# Patient Record
Sex: Female | Born: 1955 | Race: Black or African American | Hispanic: No | Marital: Single | Smoking: Former smoker
Health system: Southern US, Community
[De-identification: ages and names within clinical notes are randomized; demographics above are authoritative.]

## PROBLEM LIST (undated history)

## (undated) ENCOUNTER — Emergency Department (HOSPITAL_COMMUNITY): Admission: EM | Payer: PPO | Source: Home / Self Care

## (undated) DIAGNOSIS — H409 Unspecified glaucoma: Secondary | ICD-10-CM

## (undated) DIAGNOSIS — D649 Anemia, unspecified: Secondary | ICD-10-CM

## (undated) DIAGNOSIS — I779 Disorder of arteries and arterioles, unspecified: Secondary | ICD-10-CM

## (undated) DIAGNOSIS — F419 Anxiety disorder, unspecified: Secondary | ICD-10-CM

## (undated) DIAGNOSIS — Z9889 Other specified postprocedural states: Secondary | ICD-10-CM

## (undated) DIAGNOSIS — M1990 Unspecified osteoarthritis, unspecified site: Secondary | ICD-10-CM

## (undated) DIAGNOSIS — E538 Deficiency of other specified B group vitamins: Secondary | ICD-10-CM

## (undated) DIAGNOSIS — I480 Paroxysmal atrial fibrillation: Secondary | ICD-10-CM

## (undated) DIAGNOSIS — J189 Pneumonia, unspecified organism: Secondary | ICD-10-CM

## (undated) DIAGNOSIS — Z87442 Personal history of urinary calculi: Secondary | ICD-10-CM

## (undated) DIAGNOSIS — I700 Atherosclerosis of aorta: Secondary | ICD-10-CM

## (undated) DIAGNOSIS — N201 Calculus of ureter: Secondary | ICD-10-CM

## (undated) DIAGNOSIS — D509 Iron deficiency anemia, unspecified: Secondary | ICD-10-CM

## (undated) DIAGNOSIS — K8020 Calculus of gallbladder without cholecystitis without obstruction: Secondary | ICD-10-CM

## (undated) DIAGNOSIS — E119 Type 2 diabetes mellitus without complications: Secondary | ICD-10-CM

## (undated) DIAGNOSIS — K219 Gastro-esophageal reflux disease without esophagitis: Secondary | ICD-10-CM

## (undated) DIAGNOSIS — D689 Coagulation defect, unspecified: Secondary | ICD-10-CM

## (undated) DIAGNOSIS — T7840XA Allergy, unspecified, initial encounter: Secondary | ICD-10-CM

## (undated) DIAGNOSIS — Z973 Presence of spectacles and contact lenses: Secondary | ICD-10-CM

## (undated) DIAGNOSIS — I739 Peripheral vascular disease, unspecified: Secondary | ICD-10-CM

## (undated) DIAGNOSIS — H269 Unspecified cataract: Secondary | ICD-10-CM

## (undated) DIAGNOSIS — I10 Essential (primary) hypertension: Secondary | ICD-10-CM

## (undated) DIAGNOSIS — E785 Hyperlipidemia, unspecified: Secondary | ICD-10-CM

## (undated) DIAGNOSIS — R112 Nausea with vomiting, unspecified: Secondary | ICD-10-CM

## (undated) DIAGNOSIS — E039 Hypothyroidism, unspecified: Secondary | ICD-10-CM

## (undated) HISTORY — PX: POLYPECTOMY: SHX149

## (undated) HISTORY — PX: AUGMENTATION MAMMAPLASTY: SUR837

## (undated) HISTORY — DX: Essential (primary) hypertension: I10

## (undated) HISTORY — DX: Allergy, unspecified, initial encounter: T78.40XA

## (undated) HISTORY — DX: Hypothyroidism, unspecified: E03.9

## (undated) HISTORY — DX: Anemia, unspecified: D64.9

## (undated) HISTORY — DX: Unspecified cataract: H26.9

## (undated) HISTORY — PX: COLONOSCOPY: SHX174

## (undated) HISTORY — DX: Hyperlipidemia, unspecified: E78.5

## (undated) HISTORY — PX: BELPHAROPTOSIS REPAIR: SHX369

## (undated) HISTORY — PX: EYE SURGERY: SHX253

## (undated) HISTORY — DX: Coagulation defect, unspecified: D68.9

## (undated) HISTORY — DX: Unspecified glaucoma: H40.9

## (undated) HISTORY — PX: LITHOTRIPSY: SUR834

## (undated) HISTORY — PX: EXTRACORPOREAL SHOCK WAVE LITHOTRIPSY: SHX1557

## (undated) SURGERY — ESOPHAGOGASTRODUODENOSCOPY (EGD) WITH PROPOFOL
Anesthesia: Monitor Anesthesia Care

---
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
## 1998-05-28 ENCOUNTER — Emergency Department (HOSPITAL_COMMUNITY): Admission: EM | Admit: 1998-05-28 | Discharge: 1998-05-28 | Payer: Self-pay | Admitting: Emergency Medicine

## 1998-05-29 ENCOUNTER — Emergency Department (HOSPITAL_COMMUNITY): Admission: EM | Admit: 1998-05-29 | Discharge: 1998-05-29 | Payer: Self-pay | Admitting: Emergency Medicine

## 1999-06-06 ENCOUNTER — Other Ambulatory Visit: Admission: RE | Admit: 1999-06-06 | Discharge: 1999-06-06 | Payer: Self-pay | Admitting: General Practice

## 2002-04-14 ENCOUNTER — Inpatient Hospital Stay (HOSPITAL_COMMUNITY): Admission: EM | Admit: 2002-04-14 | Discharge: 2002-04-17 | Payer: Self-pay

## 2002-04-14 ENCOUNTER — Encounter (INDEPENDENT_AMBULATORY_CARE_PROVIDER_SITE_OTHER): Payer: Self-pay | Admitting: *Deleted

## 2002-04-14 HISTORY — PX: OTHER SURGICAL HISTORY: SHX169

## 2002-04-21 ENCOUNTER — Ambulatory Visit (HOSPITAL_BASED_OUTPATIENT_CLINIC_OR_DEPARTMENT_OTHER): Admission: RE | Admit: 2002-04-21 | Discharge: 2002-04-21 | Payer: Self-pay | Admitting: Orthopedic Surgery

## 2002-04-21 HISTORY — PX: OTHER SURGICAL HISTORY: SHX169

## 2002-06-02 ENCOUNTER — Encounter: Admission: RE | Admit: 2002-06-02 | Discharge: 2002-07-21 | Payer: Self-pay | Admitting: Specialist

## 2003-02-10 ENCOUNTER — Other Ambulatory Visit: Admission: RE | Admit: 2003-02-10 | Discharge: 2003-02-10 | Payer: Self-pay | Admitting: Gynecology

## 2003-03-23 ENCOUNTER — Encounter: Admission: RE | Admit: 2003-03-23 | Discharge: 2003-03-23 | Payer: Self-pay | Admitting: Unknown Physician Specialty

## 2004-10-25 ENCOUNTER — Other Ambulatory Visit: Admission: RE | Admit: 2004-10-25 | Discharge: 2004-10-25 | Payer: Self-pay | Admitting: Gynecology

## 2004-12-06 ENCOUNTER — Encounter: Admission: RE | Admit: 2004-12-06 | Discharge: 2005-03-06 | Payer: Self-pay | Admitting: Surgery

## 2004-12-06 ENCOUNTER — Ambulatory Visit (HOSPITAL_COMMUNITY): Admission: RE | Admit: 2004-12-06 | Discharge: 2004-12-06 | Payer: Self-pay | Admitting: Surgery

## 2004-12-14 ENCOUNTER — Ambulatory Visit (HOSPITAL_COMMUNITY): Admission: RE | Admit: 2004-12-14 | Discharge: 2004-12-14 | Payer: Self-pay | Admitting: Surgery

## 2004-12-21 ENCOUNTER — Encounter: Admission: RE | Admit: 2004-12-21 | Discharge: 2004-12-21 | Payer: Self-pay | Admitting: Unknown Physician Specialty

## 2005-05-16 ENCOUNTER — Encounter: Admission: RE | Admit: 2005-05-16 | Discharge: 2005-08-14 | Payer: Self-pay | Admitting: Surgery

## 2005-05-29 ENCOUNTER — Ambulatory Visit (HOSPITAL_COMMUNITY): Admission: RE | Admit: 2005-05-29 | Discharge: 2005-05-30 | Payer: Self-pay | Admitting: Surgery

## 2005-05-29 HISTORY — PX: LAPAROSCOPIC GASTRIC BANDING: SHX1100

## 2005-09-07 ENCOUNTER — Encounter: Admission: RE | Admit: 2005-09-07 | Discharge: 2005-09-07 | Payer: Self-pay | Admitting: Surgery

## 2005-11-30 ENCOUNTER — Other Ambulatory Visit: Admission: RE | Admit: 2005-11-30 | Discharge: 2005-11-30 | Payer: Self-pay | Admitting: Vascular Surgery

## 2005-12-22 ENCOUNTER — Encounter: Admission: RE | Admit: 2005-12-22 | Discharge: 2005-12-22 | Payer: Self-pay | Admitting: Unknown Physician Specialty

## 2005-12-25 ENCOUNTER — Encounter: Admission: RE | Admit: 2005-12-25 | Discharge: 2005-12-25 | Payer: Self-pay | Admitting: Surgery

## 2006-08-15 ENCOUNTER — Other Ambulatory Visit: Admission: RE | Admit: 2006-08-15 | Discharge: 2006-08-15 | Payer: Self-pay | Admitting: Gynecology

## 2006-08-27 ENCOUNTER — Encounter: Admission: RE | Admit: 2006-08-27 | Discharge: 2006-11-25 | Payer: Self-pay | Admitting: Surgery

## 2007-03-04 ENCOUNTER — Encounter: Admission: RE | Admit: 2007-03-04 | Discharge: 2007-03-04 | Payer: Self-pay | Admitting: Gynecology

## 2007-05-02 HISTORY — PX: COMBINED AUGMENTATION MAMMAPLASTY AND ABDOMINOPLASTY: SUR291

## 2008-02-11 LAB — HM PAP SMEAR: HM Pap smear: NORMAL

## 2008-04-13 ENCOUNTER — Encounter: Admission: RE | Admit: 2008-04-13 | Discharge: 2008-04-13 | Payer: Self-pay | Admitting: Gynecology

## 2008-05-12 ENCOUNTER — Other Ambulatory Visit: Admission: RE | Admit: 2008-05-12 | Discharge: 2008-05-12 | Payer: Self-pay | Admitting: Gynecology

## 2008-05-13 LAB — CONVERTED CEMR LAB: Pap Smear: NORMAL

## 2008-08-05 ENCOUNTER — Telehealth: Payer: Self-pay | Admitting: Internal Medicine

## 2008-08-05 ENCOUNTER — Ambulatory Visit: Payer: Self-pay | Admitting: Internal Medicine

## 2008-08-05 DIAGNOSIS — E1165 Type 2 diabetes mellitus with hyperglycemia: Secondary | ICD-10-CM

## 2008-08-05 DIAGNOSIS — D649 Anemia, unspecified: Secondary | ICD-10-CM | POA: Insufficient documentation

## 2008-08-05 DIAGNOSIS — IMO0002 Reserved for concepts with insufficient information to code with codable children: Secondary | ICD-10-CM | POA: Insufficient documentation

## 2008-08-05 DIAGNOSIS — E785 Hyperlipidemia, unspecified: Secondary | ICD-10-CM | POA: Insufficient documentation

## 2008-08-05 DIAGNOSIS — I10 Essential (primary) hypertension: Secondary | ICD-10-CM

## 2008-08-05 DIAGNOSIS — E1151 Type 2 diabetes mellitus with diabetic peripheral angiopathy without gangrene: Secondary | ICD-10-CM

## 2008-08-05 LAB — CONVERTED CEMR LAB
ALT: 15 units/L (ref 0–35)
AST: 14 units/L (ref 0–37)
Albumin: 4 g/dL (ref 3.5–5.2)
Alkaline Phosphatase: 64 units/L (ref 39–117)
BUN: 15 mg/dL (ref 6–23)
Basophils Absolute: 0 10*3/uL (ref 0.0–0.1)
Basophils Relative: 0.1 % (ref 0.0–3.0)
Bilirubin Urine: NEGATIVE
Bilirubin, Direct: 0.1 mg/dL (ref 0.0–0.3)
CO2: 31 meq/L (ref 19–32)
Calcium: 9.9 mg/dL (ref 8.4–10.5)
Chloride: 108 meq/L (ref 96–112)
Cholesterol, target level: 200 mg/dL
Cholesterol: 194 mg/dL (ref 0–200)
Creatinine, Ser: 0.7 mg/dL (ref 0.4–1.2)
Creatinine,U: 77.5 mg/dL
Eosinophils Absolute: 0.1 10*3/uL (ref 0.0–0.7)
Eosinophils Relative: 1.3 % (ref 0.0–5.0)
Folate: 18.4 ng/mL
GFR calc non Af Amer: 93.6 mL/min (ref >60)
Glucose, Bld: 79 mg/dL (ref 70–99)
HCT: 33.1 % — ABNORMAL LOW (ref 36.0–46.0)
HDL goal, serum: 40 mg/dL
HDL: 47.7 mg/dL (ref >39.00)
Hemoglobin: 10.9 g/dL — ABNORMAL LOW (ref 12.0–15.0)
Hgb A1c MFr Bld: 6.6 % — ABNORMAL HIGH (ref 4.6–6.5)
Iron: 70 ug/dL (ref 42–145)
Ketones, ur: NEGATIVE mg/dL
LDL Cholesterol: 118 mg/dL — ABNORMAL HIGH (ref 0–99)
LDL Goal: 100 mg/dL
Lymphocytes Relative: 37.4 % (ref 12.0–46.0)
Lymphs Abs: 3 10*3/uL (ref 0.7–4.0)
MCHC: 33 g/dL (ref 30.0–36.0)
MCV: 86.2 fL (ref 78.0–100.0)
Microalb Creat Ratio: 74.8 mg/g — ABNORMAL HIGH (ref 0.0–30.0)
Microalb, Ur: 5.8 mg/dL — ABNORMAL HIGH (ref 0.0–1.9)
Monocytes Absolute: 0.5 10*3/uL (ref 0.1–1.0)
Monocytes Relative: 5.9 % (ref 3.0–12.0)
Neutro Abs: 4.3 10*3/uL (ref 1.4–7.7)
Neutrophils Relative %: 55.3 % (ref 43.0–77.0)
Nitrite: POSITIVE
Platelets: 264 10*3/uL (ref 150.0–400.0)
Potassium: 3.9 meq/L (ref 3.5–5.1)
RBC: 3.84 M/uL — ABNORMAL LOW (ref 3.87–5.11)
RDW: 14.4 % (ref 11.5–14.6)
Saturation Ratios: 23 % (ref 20.0–50.0)
Sodium: 145 meq/L (ref 135–145)
Specific Gravity, Urine: 1.025 (ref 1.000–1.030)
TSH: 0.06 microintl units/mL — ABNORMAL LOW (ref 0.35–5.50)
Total Bilirubin: 0.8 mg/dL (ref 0.3–1.2)
Total CHOL/HDL Ratio: 4
Total Protein: 7 g/dL (ref 6.0–8.3)
Transferrin: 217.1 mg/dL (ref 212.0–360.0)
Triglycerides: 143 mg/dL (ref 0.0–149.0)
Urine Glucose: NEGATIVE mg/dL
Urobilinogen, UA: 0.2 (ref 0.0–1.0)
VLDL: 28.6 mg/dL (ref 0.0–40.0)
Vitamin B-12: 372 pg/mL (ref 211–911)
WBC: 7.9 10*3/uL (ref 4.5–10.5)
pH: 6 (ref 5.0–8.0)

## 2008-10-05 ENCOUNTER — Ambulatory Visit: Payer: Self-pay | Admitting: Internal Medicine

## 2008-10-05 DIAGNOSIS — N390 Urinary tract infection, site not specified: Secondary | ICD-10-CM

## 2008-10-06 ENCOUNTER — Telehealth (INDEPENDENT_AMBULATORY_CARE_PROVIDER_SITE_OTHER): Payer: Self-pay | Admitting: *Deleted

## 2008-10-07 ENCOUNTER — Encounter: Payer: Self-pay | Admitting: Internal Medicine

## 2008-11-03 ENCOUNTER — Telehealth: Payer: Self-pay | Admitting: Internal Medicine

## 2008-11-04 ENCOUNTER — Ambulatory Visit: Payer: Self-pay | Admitting: Endocrinology

## 2008-11-04 DIAGNOSIS — E042 Nontoxic multinodular goiter: Secondary | ICD-10-CM | POA: Insufficient documentation

## 2008-11-04 DIAGNOSIS — E0590 Thyrotoxicosis, unspecified without thyrotoxic crisis or storm: Secondary | ICD-10-CM

## 2008-11-20 ENCOUNTER — Encounter: Payer: Self-pay | Admitting: Internal Medicine

## 2008-12-17 ENCOUNTER — Encounter (HOSPITAL_COMMUNITY): Admission: RE | Admit: 2008-12-17 | Discharge: 2009-01-28 | Payer: Self-pay | Admitting: Endocrinology

## 2009-01-27 ENCOUNTER — Encounter (INDEPENDENT_AMBULATORY_CARE_PROVIDER_SITE_OTHER): Payer: Self-pay | Admitting: *Deleted

## 2009-02-01 ENCOUNTER — Ambulatory Visit: Payer: Self-pay | Admitting: Internal Medicine

## 2009-02-01 ENCOUNTER — Telehealth: Payer: Self-pay | Admitting: Internal Medicine

## 2009-02-01 LAB — CONVERTED CEMR LAB
ALT: 11 units/L (ref 0–35)
AST: 14 units/L (ref 0–37)
Albumin: 4.2 g/dL (ref 3.5–5.2)
Alkaline Phosphatase: 74 units/L (ref 39–117)
BUN: 14 mg/dL (ref 6–23)
Basophils Absolute: 0.1 10*3/uL (ref 0.0–0.1)
Basophils Relative: 0.9 % (ref 0.0–3.0)
Bilirubin Urine: NEGATIVE
Bilirubin, Direct: 0 mg/dL (ref 0.0–0.3)
CO2: 31 meq/L (ref 19–32)
Calcium: 10 mg/dL (ref 8.4–10.5)
Chloride: 108 meq/L (ref 96–112)
Cholesterol: 238 mg/dL — ABNORMAL HIGH (ref 0–200)
Creatinine, Ser: 0.7 mg/dL (ref 0.4–1.2)
Creatinine,U: 129.9 mg/dL
Direct LDL: 165.6 mg/dL
Eosinophils Absolute: 0.1 10*3/uL (ref 0.0–0.7)
Eosinophils Relative: 1 % (ref 0.0–5.0)
GFR calc non Af Amer: 93.42 mL/min (ref >60)
Glucose, Bld: 88 mg/dL (ref 70–99)
HCT: 36.7 % (ref 36.0–46.0)
HDL: 51.2 mg/dL (ref >39.00)
Hemoglobin: 12.7 g/dL (ref 12.0–15.0)
Hep A Total Ab: POSITIVE — AB
Hep B Core Total Ab: NEGATIVE
Hep B S Ab: NEGATIVE
Hgb A1c MFr Bld: 6.4 % (ref 4.6–6.5)
Ketones, ur: NEGATIVE mg/dL
Lymphocytes Relative: 29.6 % (ref 12.0–46.0)
Lymphs Abs: 2.7 10*3/uL (ref 0.7–4.0)
MCHC: 34.6 g/dL (ref 30.0–36.0)
MCV: 85.8 fL (ref 78.0–100.0)
Microalb Creat Ratio: 28.5 mg/g (ref 0.0–30.0)
Microalb, Ur: 3.7 mg/dL — ABNORMAL HIGH (ref 0.0–1.9)
Monocytes Absolute: 0.4 10*3/uL (ref 0.1–1.0)
Monocytes Relative: 4.8 % (ref 3.0–12.0)
Neutro Abs: 5.7 10*3/uL (ref 1.4–7.7)
Neutrophils Relative %: 63.7 % (ref 43.0–77.0)
Nitrite: POSITIVE
Platelets: 290 10*3/uL (ref 150.0–400.0)
Potassium: 4.3 meq/L (ref 3.5–5.1)
RBC: 4.28 M/uL (ref 3.87–5.11)
RDW: 12.2 % (ref 11.5–14.6)
Rubella: >500 intl units/mL — ABNORMAL HIGH
Rubeola IgG: >7.4 — ABNORMAL HIGH
Sodium: 143 meq/L (ref 135–145)
Specific Gravity, Urine: 1.025 (ref 1.000–1.030)
Total Bilirubin: 0.7 mg/dL (ref 0.3–1.2)
Total CHOL/HDL Ratio: 5
Total Protein, Urine: NEGATIVE mg/dL
Total Protein: 7.4 g/dL (ref 6.0–8.3)
Triglycerides: 173 mg/dL — ABNORMAL HIGH (ref 0.0–149.0)
Urine Glucose: NEGATIVE mg/dL
Urobilinogen, UA: 0.2 (ref 0.0–1.0)
VLDL: 34.6 mg/dL (ref 0.0–40.0)
WBC: 9 10*3/uL (ref 4.5–10.5)
pH: 6 (ref 5.0–8.0)

## 2009-02-02 ENCOUNTER — Telehealth: Payer: Self-pay | Admitting: Internal Medicine

## 2009-05-24 ENCOUNTER — Telehealth: Payer: Self-pay | Admitting: Internal Medicine

## 2009-09-29 LAB — HM MAMMOGRAPHY: HM Mammogram: NORMAL

## 2010-02-22 LAB — HM DIABETES EYE EXAM

## 2010-03-01 ENCOUNTER — Ambulatory Visit: Payer: Self-pay | Admitting: Internal Medicine

## 2010-03-01 LAB — CONVERTED CEMR LAB
ALT: 11 units/L (ref 0–35)
AST: 14 units/L (ref 0–37)
Albumin: 3.8 g/dL (ref 3.5–5.2)
Alkaline Phosphatase: 56 units/L (ref 39–117)
BUN: 17 mg/dL (ref 6–23)
Basophils Absolute: 0 10*3/uL (ref 0.0–0.1)
Basophils Relative: 0.4 % (ref 0.0–3.0)
Bilirubin Urine: NEGATIVE
Bilirubin, Direct: 0 mg/dL (ref 0.0–0.3)
CO2: 29 meq/L (ref 19–32)
Calcium: 9.7 mg/dL (ref 8.4–10.5)
Chloride: 107 meq/L (ref 96–112)
Cholesterol: 235 mg/dL — ABNORMAL HIGH (ref 0–200)
Creatinine, Ser: 0.7 mg/dL (ref 0.4–1.2)
Creatinine,U: 89.4 mg/dL
Direct LDL: 160.2 mg/dL
Eosinophils Absolute: 0.1 10*3/uL (ref 0.0–0.7)
Eosinophils Relative: 1.7 % (ref 0.0–5.0)
Folate: 8.4 ng/mL
GFR calc non Af Amer: 94.58 mL/min (ref >60)
Glucose, Bld: 60 mg/dL — ABNORMAL LOW (ref 70–99)
HCT: 32.6 % — ABNORMAL LOW (ref 36.0–46.0)
HDL: 49.3 mg/dL (ref >39.00)
Hemoglobin: 11.1 g/dL — ABNORMAL LOW (ref 12.0–15.0)
Hgb A1c MFr Bld: 7 % — ABNORMAL HIGH (ref 4.6–6.5)
Iron: 35 ug/dL — ABNORMAL LOW (ref 42–145)
Ketones, ur: NEGATIVE mg/dL
Lymphocytes Relative: 40 % (ref 12.0–46.0)
Lymphs Abs: 3.3 10*3/uL (ref 0.7–4.0)
MCHC: 34.1 g/dL (ref 30.0–36.0)
MCV: 83.4 fL (ref 78.0–100.0)
Microalb Creat Ratio: 3.9 mg/g (ref 0.0–30.0)
Microalb, Ur: 3.5 mg/dL — ABNORMAL HIGH (ref 0.0–1.9)
Monocytes Absolute: 0.5 10*3/uL (ref 0.1–1.0)
Monocytes Relative: 6.1 % (ref 3.0–12.0)
Neutro Abs: 4.2 10*3/uL (ref 1.4–7.7)
Neutrophils Relative %: 51.8 % (ref 43.0–77.0)
Nitrite: NEGATIVE
Platelets: 285 10*3/uL (ref 150.0–400.0)
Potassium: 4 meq/L (ref 3.5–5.1)
RBC: 3.91 M/uL (ref 3.87–5.11)
RDW: 14.4 % (ref 11.5–14.6)
Saturation Ratios: 9.9 % — ABNORMAL LOW (ref 20.0–50.0)
Sodium: 142 meq/L (ref 135–145)
Specific Gravity, Urine: 1.025 (ref 1.000–1.030)
TSH: 0.11 microintl units/mL — ABNORMAL LOW (ref 0.35–5.50)
Total Bilirubin: 0.6 mg/dL (ref 0.3–1.2)
Total CHOL/HDL Ratio: 5
Total Protein, Urine: NEGATIVE mg/dL
Total Protein: 6.7 g/dL (ref 6.0–8.3)
Transferrin: 253.1 mg/dL (ref 212.0–360.0)
Triglycerides: 128 mg/dL (ref 0.0–149.0)
Urine Glucose: NEGATIVE mg/dL
Urobilinogen, UA: 0.2 (ref 0.0–1.0)
VLDL: 25.6 mg/dL (ref 0.0–40.0)
Vitamin B-12: 322 pg/mL (ref 211–911)
WBC: 8.2 10*3/uL (ref 4.5–10.5)
pH: 6 (ref 5.0–8.0)

## 2010-03-02 ENCOUNTER — Encounter: Payer: Self-pay | Admitting: Internal Medicine

## 2010-03-03 ENCOUNTER — Encounter: Payer: Self-pay | Admitting: Internal Medicine

## 2010-03-04 ENCOUNTER — Telehealth: Payer: Self-pay | Admitting: Internal Medicine

## 2010-03-07 ENCOUNTER — Encounter: Admission: RE | Admit: 2010-03-07 | Discharge: 2010-03-07 | Payer: Self-pay | Admitting: Surgery

## 2010-04-08 ENCOUNTER — Ambulatory Visit (HOSPITAL_COMMUNITY)
Admission: RE | Admit: 2010-04-08 | Discharge: 2010-04-08 | Payer: Self-pay | Source: Home / Self Care | Attending: Surgery | Admitting: Surgery

## 2010-04-08 HISTORY — PX: OTHER SURGICAL HISTORY: SHX169

## 2010-04-13 ENCOUNTER — Encounter: Payer: Self-pay | Admitting: Internal Medicine

## 2010-04-18 ENCOUNTER — Ambulatory Visit: Payer: Self-pay | Admitting: Internal Medicine

## 2010-04-18 LAB — HM DIABETES FOOT EXAM

## 2010-05-03 ENCOUNTER — Encounter: Payer: Self-pay | Admitting: Internal Medicine

## 2010-05-22 ENCOUNTER — Encounter: Payer: Self-pay | Admitting: Endocrinology

## 2010-06-02 NOTE — Letter (Signed)
Summary: Results Follow-up Letter  [facility]  [address] [place], [place] [address]   Phone: [phone]  Fax: [phone]    03/02/2010  [address] [place], [place]  [address]  Dear Ms. Humphreys,   The following are the results of your recent test(s):  Test     Result     CBC       mild anemia iron level     low B12 level     normal Thyroid     slightly overactive Blood sugars   good control Liver/kidney   normal Urine       WBC's, culture is not back yet   _________________________________________________________  Please call for an appointment Or _________________________________________________________ _________________________________________________________ _________________________________________________________  Sincerely,  Sanda Linger MD [facility]

## 2010-06-02 NOTE — Letter (Signed)
Summary: [facility]   Imported By: Sherian Rein 04/29/2010 09:40:52  _____________________________________________________________________  External Attachment:    Type:   Image     Comment:   External Document

## 2010-06-02 NOTE — Letter (Signed)
Summary: Lipid Letter  [facility]  [address] [place], [place] [address]   Phone: [phone]  Fax: [phone]    03/02/2010  Theresa Harrington [address] [place], [place]  [address]  Dear Theresa Harrington:  We have carefully reviewed your last lipid profile from 08/05/2008 and the results are noted below with a summary of recommendations for lipid management.    Cholesterol:       235     Goal: <200   HDL "good" Cholesterol:   37.10     Goal: >40   LDL "bad" Cholesterol:   160     Goal: <100   Triglycerides:       128.0     Goal: <150        TLC Diet (Therapeutic Lifestyle Change): Saturated Fats & Transfatty acids should be kept < 7% of total calories ***Reduce Saturated Fats Polyunstaurated Fat can be up to 10% of total calories Monounsaturated Fat Fat can be up to 20% of total calories Total Fat should be no greater than 25-35% of total calories Carbohydrates should be 50-60% of total calories Protein should be approximately 15% of total calories Fiber should be at least 20-30 grams a day ***Increased fiber may help lower LDL Total Cholesterol should be < 200mg /day Consider adding plant stanol/sterols to diet (example: Benacol spread) ***A higher intake of unsaturated fat may reduce Triglycerides and Increase HDL    Adjunctive Measures (may lower LIPIDS and reduce risk of Heart Attack) include: Aerobic Exercise (20-30 minutes 3-4 times a week) Limit Alcohol Consumption Weight Reduction Aspirin 75-81 mg a day by mouth (if not allergic or contraindicated) Dietary Fiber 20-30 grams a day by mouth     Current Medications: 1)    Metformin Hcl 1000 Mg Tabs (Metformin hcl) .... Take 1 tablet by mouth two times a day 2)    Glyburide 5 Mg Tabs (Glyburide) .... Take 1 tablet by mouth once a day 3)    Diovan 160 Mg Tabs (Valsartan) .... One by mouth once daily 4)    Vytorin 10-40 Mg Tabs (Ezetimibe-simvastatin) .... One by mouth once daily for cholesterol  If you  have any questions, please call. We appreciate being able to work with you.   Sincerely,    [facility] Etta Grandchild MD

## 2010-06-02 NOTE — Assessment & Plan Note (Signed)
Summary: f/u appt it has been a year/requests bloodwork/#/cd   Vital Signs:  Patient profile:   55 year old female Menstrual status:  hysterectomy Height:      66 inches Weight:      159.25 pounds BMI:     25.80 O2 Sat:      99 % on Room air Temp:     97.7 degrees F oral Pulse rate:   72 / minute Pulse rhythm:   regular Resp:     16 per minute BP sitting:   114 / 68  (left arm) Cuff size:   large  Vitals Entered By: Rock Nephew CMA (March 01, 2010 9:35 AM)  Nutrition Counseling: Patient's BMI is greater than 25 and therefore counseled on weight management options.  O2 Flow:  Room air  Primary Care Provider:  Etta Grandchild MD   History of Present Illness:  Follow-Up Visit      This is a 55 year old woman who presents for Follow-up visit.  The patient denies chest pain, palpitations, dizziness, syncope, low blood sugar symptoms, high blood sugar symptoms, edema, SOB, DOE, PND, and orthopnea.  Since the last visit the patient notes problems with medications.  The patient reports taking meds as prescribed, monitoring BP, monitoring blood sugars, and dietary compliance.  When questioned about possible medication side effects, the patient notes dry cough.    She tells me that she has been going to an [facility] over the last year and that she has had many UTI's and she needs to find out what is wrong with her kidneys and bladder that is causing this.  Hypertension History:      She complains of side effects from treatment, but denies headache, chest pain, palpitations, dyspnea with exertion, orthopnea, PND, peripheral edema, visual symptoms, neurologic problems, and syncope.  She notes the following problems with antihypertensive medication side effects: dry cough since she restarted Lisinopril.        Positive major cardiovascular risk factors include diabetes, hyperlipidemia, and hypertension.  Negative major cardiovascular risk factors include female age less than 33 years old,  negative family history for ischemic heart disease, and non-tobacco-user status.        Further assessment for target organ damage reveals no history of ASHD, cardiac end-organ damage (CHF/LVH), stroke/TIA, peripheral vascular disease, renal insufficiency, or hypertensive retinopathy.     Preventive Screening-Counseling & Management  Alcohol-Tobacco     Alcohol drinks/day: 0     Alcohol Counseling: not indicated; patient does not drink     Smoking Status: quit > 6 months     Year Quit: 2006     Tobacco Counseling: to remain off tobacco products  Hep-HIV-STD-Contraception     Hepatitis Risk: no risk noted     HIV Risk: no risk noted     STD Risk: no risk noted      Sexual History:  currently monogamous.        Drug Use:  no.        Blood Transfusions:  no.    Clinical Review Panels:  Prevention   Last Mammogram:  normal (09/29/2009)   Last Pap Smear:  Normal (05/13/2008)  Immunizations   Last Tetanus Booster:  Tdap (08/05/2008)  Lipid Management   Cholesterol:  238 (02/01/2009)   LDL (bad choesterol):  118 (08/05/2008)   HDL (good cholesterol):  51.20 (02/01/2009)  Diabetes Management   HgBA1C:  6.4 (02/01/2009)   Creatinine:  0.7 (02/01/2009)   Last Dilated Eye  Exam:  diabetic retinopathy (02/22/2010)   Last Foot Exam:  yes (03/01/2010)  CBC   WBC:  9.0 (02/01/2009)   RBC:  4.28 (02/01/2009)   Hgb:  12.7 (02/01/2009)   Hct:  36.7 (02/01/2009)   Platelets:  290.0 (02/01/2009)   MCV  85.8 (02/01/2009)   MCHC  34.6 (02/01/2009)   RDW  12.2 (02/01/2009)   PMN:  63.7 (02/01/2009)   Lymphs:  29.6 (02/01/2009)   Monos:  4.8 (02/01/2009)   Eosinophils:  1.0 (02/01/2009)   Basophil:  0.9 (02/01/2009)  Complete Metabolic Panel   Glucose:  88 (02/01/2009)   Sodium:  143 (02/01/2009)   Potassium:  4.3 (02/01/2009)   Chloride:  108 (02/01/2009)   CO2:  31 (02/01/2009)   BUN:  14 (02/01/2009)   Creatinine:  0.7 (02/01/2009)   Albumin:  4.2 (02/01/2009)   Total  Protein:  7.4 (02/01/2009)   Calcium:  10.0 (02/01/2009)   Total Bili:  0.7 (02/01/2009)   Alk Phos:  74 (02/01/2009)   SGPT (ALT):  11 (02/01/2009)   SGOT (AST):  14 (02/01/2009)   Medications Prior to Update: 1)  Metformin Hcl 1000 Mg Tabs (Metformin Hcl) .... Take 1 Tablet By Mouth Two Times A Day 2)  Glyburide 5 Mg Tabs (Glyburide) .... Take 1 Tablet By Mouth Once A Day 3)  Cipro 250 Mg Tabs (Ciprofloxacin Hcl) .... One By Mouth Two Times A Day For 5 Days 4)  Diovan 160 Mg Tabs (Valsartan) .... One By Mouth Once Daily For High Blood Pressure  Current Medications (verified): 1)  Metformin Hcl 1000 Mg Tabs (Metformin Hcl) .... Take 1 Tablet By Mouth Two Times A Day 2)  Glyburide 5 Mg Tabs (Glyburide) .... Take 1 Tablet By Mouth Once A Day 3)  Diovan 160 Mg Tabs (Valsartan) .... One By Mouth Once Daily 4)  Vytorin 10-40 Mg Tabs (Ezetimibe-Simvastatin) .... One By Mouth Once Daily For Cholesterol  Allergies (verified): 1)  ! Codeine 2)  ! Lisinopril (Lisinopril)  Past History:  Past Medical History: Last updated: 08/05/2008 Anemia-NOS Diabetes mellitus, type II Hyperlipidemia Hypertension Hypothyroidism  Past Surgical History: Last updated: 08/05/2008 Gastric bypass  Family History: Last updated: 11/04/2008 Family History of Arthritis Family History of Colon CA 1st degree relative <60 Family History Diabetes 1st degree relative Family History Hypertension Family History Kidney disease Family History of Stroke M 1st degree relative <50 Family History of Cardiovascular disorder no thyroid dz  Social History: Last updated: 08/05/2008 Occupation: Secondary school teacher Single Alcohol use-no Drug use-no Regular exercise-yes  Risk Factors: Alcohol Use: 0 (03/01/2010) Exercise: yes (08/05/2008)  Risk Factors: Smoking Status: quit > 6 months (03/01/2010)  Family History: Reviewed history from 11/04/2008 and no changes required. Family History of Arthritis Family History of  Colon CA 1st degree relative <60 Family History Diabetes 1st degree relative Family History Hypertension Family History Kidney disease Family History of Stroke M 1st degree relative <50 Family History of Cardiovascular disorder no thyroid dz  Social History: Reviewed history from 08/05/2008 and no changes required. Occupation: Secondary school teacher Single Alcohol use-no Drug use-no Regular exercise-yes Smoking Status:  quit > 6 months  Review of Systems  The patient denies anorexia, fever, weight loss, weight gain, chest pain, syncope, dyspnea on exertion, peripheral edema, headaches, hemoptysis, abdominal pain, melena, hematochezia, severe indigestion/heartburn, hematuria, muscle weakness, suspicious skin lesions, depression, abnormal bleeding, and enlarged lymph nodes.   CV:  Denies chest pain or discomfort, difficulty breathing at night, difficulty breathing while lying down, fainting, fatigue, leg  cramps with exertion, lightheadness, near fainting, palpitations, shortness of breath with exertion, and swelling of feet. Resp:  Complains of cough; denies chest pain with inspiration, coughing up blood, excessive snoring, hypersomnolence, morning headaches, pleuritic, shortness of breath, sputum productive, and wheezing. GU:  Denies discharge, dysuria, hematuria, nocturia, urinary frequency, and urinary hesitancy. Endo:  Denies cold intolerance, excessive hunger, excessive thirst, excessive urination, heat intolerance, polyuria, and weight change. Heme:  Denies abnormal bruising, bleeding, enlarge lymph nodes, fevers, pallor, and skin discoloration.  Physical Exam  General:  alert, well-developed, well-nourished, well-hydrated, appropriate dress, normal appearance, healthy-appearing, cooperative to examination, and good hygiene.   Head:  normocephalic, atraumatic, no abnormalities observed, and no abnormalities palpated.   Eyes:  vision grossly intact, pupils equal, pupils round, and pupils reactive to  light.   Ears:  R ear normal and L ear normal.   Nose:  External nasal examination shows no deformity or inflammation. Nasal mucosa are pink and moist without lesions or exudates. Mouth:  Oral mucosa and oropharynx without lesions or exudates.  Teeth in good repair. Neck:  supple, full ROM, no masses, no thyromegaly, no thyroid nodules or tenderness, no JVD, and normal carotid upstroke.   Lungs:  normal respiratory effort, no intercostal retractions, no accessory muscle use, normal breath sounds, no dullness, no fremitus, no crackles, and no wheezes.   Heart:  normal rate, regular rhythm, no murmur, no gallop, no rub, and no JVD.   Abdomen:  soft, non-tender, normal bowel sounds, no distention, no masses, no guarding, no rigidity, no rebound tenderness, no abdominal hernia, no inguinal hernia, no hepatomegaly, no splenomegaly, and abdominal scar(s).  LapBand device noted over right lower quadrant. Rectal:  No external abnormalities noted. Normal sphincter tone. No rectal masses or tenderness. Msk:  normal ROM, no joint tenderness, no joint swelling, and no joint warmth.   Pulses:  R and L carotid,radial,femoral,dorsalis pedis and posterior tibial pulses are full and equal bilaterally Extremities:  No clubbing, cyanosis, edema, or deformity noted with normal full range of motion of all joints.   Neurologic:  alert & oriented X3, cranial nerves II-XII intact, strength normal in all extremities, gait normal, and DTRs symmetrical and normal.   Skin:  turgor normal, color normal, no rashes, no suspicious lesions, no ecchymoses, and no petechiae.   Cervical Nodes:  No significant adenopathy.  Axillary Nodes:  no R axillary adenopathy and no L axillary adenopathy.   Psych:  Alert and cooperative; normal mood and affect; normal attention span and concentration.    Diabetes Management Exam:    Foot Exam (with socks and/or shoes not present):       Sensory-Pinprick/Light touch:          Left medial foot  (L-4): normal          Left dorsal foot (L-5): normal          Left lateral foot (S-1): normal          Right medial foot (L-4): normal          Right dorsal foot (L-5): normal          Right lateral foot (S-1): normal       Sensory-Monofilament:          Left foot: normal          Right foot: normal       Inspection:          Left foot: normal  Right foot: normal       Nails:          Left foot: normal          Right foot: normal    Eye Exam:       Eye Exam done elsewhere          Date: 02/22/2010          Results: diabetic retinopathy          Done by: Dione Booze   Impression & Recommendations:  Problem # 1:  HYPERTHYROIDISM (ICD-242.90) Assessment Unchanged  Orders: Venipuncture ([address]) TLB-Lipid Panel (80061-LIPID) TLB-CBC Platelet - w/Differential (85025-CBCD) TLB-BMP (Basic Metabolic Panel-BMET) (80048-METABOL) TLB-Hepatic/Liver Function Pnl (80076-HEPATIC) TLB-TSH (Thyroid Stimulating Hormone) (84443-TSH) TLB-IBC Pnl (Iron/FE;Transferrin) (83550-IBC) TLB-B12 + Folate Pnl (82746_82607-B12/FOL) TLB-A1C / Hgb A1C (Glycohemoglobin) (83036-A1C) TLB-Microalbumin/Creat Ratio, Urine (82043-MALB) TLB-Udip w/ Micro (81001-URINE) T-Urine Culture (Spectrum Order) ([id number])  Problem # 2:  UTI (ICD-599.0) Assessment: Unchanged  The following medications were removed from the medication list:    Cipro 250 Mg Tabs (Ciprofloxacin hcl) ..... One by mouth two times a day for 5 days  Orders: Venipuncture (29562) TLB-Lipid Panel (80061-LIPID) TLB-CBC Platelet - w/Differential (85025-CBCD) TLB-BMP (Basic Metabolic Panel-BMET) (80048-METABOL) TLB-Hepatic/Liver Function Pnl (80076-HEPATIC) TLB-TSH (Thyroid Stimulating Hormone) (84443-TSH) TLB-IBC Pnl (Iron/FE;Transferrin) (83550-IBC) TLB-B12 + Folate Pnl (13086_57846-N62/XBM) TLB-A1C / Hgb A1C (Glycohemoglobin) (83036-A1C) TLB-Microalbumin/Creat Ratio, Urine (82043-MALB) TLB-Udip w/ Micro (81001-URINE) T-Urine Culture  (Spectrum Order) ([id number]) Urology Referral (Urology)  Problem # 3:  HYPERTENSION (ICD-401.9) Assessment: Unchanged  The following medications were removed from the medication list:    Diovan 160 Mg Tabs (Valsartan) ..... One by mouth once daily for high blood pressure    Lisinopril-hydrochlorothiazide 20-25 Mg Tabs (Lisinopril-hydrochlorothiazide) Her updated medication list for this problem includes:    Diovan 160 Mg Tabs (Valsartan) ..... One by mouth once daily  Orders: Venipuncture ([address]253) TLB-Lipid Panel (80061-LIPID) TLB-CBC Platelet - w/Differential (85025-CBCD) TLB-BMP (Basic Metabolic Panel-BMET) (80048-METABOL) TLB-Hepatic/Liver Function Pnl (80076-HEPATIC) TLB-TSH (Thyroid Stimulating Hormone) (84443-TSH) TLB-IBC Pnl (Iron/FE;Transferrin) (83550-IBC) TLB-B12 + Folate Pnl (66440_34742-V95/GLO) TLB-A1C / Hgb A1C (Glycohemoglobin) (83036-A1C) TLB-Microalbumin/Creat Ratio, Urine (82043-MALB) TLB-Udip w/ Micro (81001-URINE) T-Urine Culture (Spectrum Order) ([id number])  BP today: 114/68 Prior BP: 130/80 (02/01/2009)  Prior 10 Yr Risk Heart Disease: 15 % (02/01/2009)  Labs Reviewed: K+: 4.3 (02/01/2009) Creat: : 0.7 (02/01/2009)   Chol: 238 (02/01/2009)   HDL: 51.20 (02/01/2009)   LDL: 118 (08/05/2008)   TG: 173.0 (02/01/2009)  Problem # 4:  HYPERLIPIDEMIA (ICD-272.4) Assessment: Deteriorated  Her updated medication list for this problem includes:    Vytorin 10-40 Mg Tabs (Ezetimibe-simvastatin) ..... One by mouth once daily for cholesterol  Orders: Venipuncture (88416) TLB-Lipid Panel (80061-LIPID) TLB-CBC Platelet - w/Differential (85025-CBCD) TLB-BMP (Basic Metabolic Panel-BMET) (80048-METABOL) TLB-Hepatic/Liver Function Pnl (80076-HEPATIC) TLB-TSH (Thyroid Stimulating Hormone) (84443-TSH) TLB-IBC Pnl (Iron/FE;Transferrin) (83550-IBC) TLB-B12 + Folate Pnl (60630_16010-X32/TFT) TLB-A1C / Hgb A1C (Glycohemoglobin) (83036-A1C) TLB-Microalbumin/Creat  Ratio, Urine (82043-MALB) TLB-Udip w/ Micro (81001-URINE) T-Urine Culture (Spectrum Order) ([id number])  Labs Reviewed: SGOT: 14 (02/01/2009)   SGPT: 11 (02/01/2009)  Lipid Goals: Chol Goal: 200 (08/05/2008)   HDL Goal: 40 (08/05/2008)   LDL Goal: 100 (08/05/2008)   TG Goal: 150 (08/05/2008)  Prior 10 Yr Risk Heart Disease: 15 % (02/01/2009)   HDL:51.20 (02/01/2009), 47.70 (08/05/2008)  LDL:118 (08/05/2008)  Chol:238 (02/01/2009), 194 (08/05/2008)  Trig:173.0 (02/01/2009), 143.0 (08/05/2008)  Problem # 5:  DIABETES MELLITUS, TYPE II (ICD-250.00) Assessment: Unchanged  The following medications were removed from the medication list:    Diovan 160 Mg  Tabs (Valsartan) ..... One by mouth once daily for high blood pressure    Lisinopril-hydrochlorothiazide 20-25 Mg Tabs (Lisinopril-hydrochlorothiazide) Her updated medication list for this problem includes:    Metformin Hcl 1000 Mg Tabs (Metformin hcl) .[name]... Take 1 tablet by mouth two times a day    Glyburide 5 Mg Tabs (Glyburide) .[name]... Take 1 tablet by mouth once a day    Diovan 160 Mg Tabs (Valsartan) ..... One by mouth once daily  Orders: Venipuncture (11914) TLB-Lipid Panel (80061-LIPID) TLB-CBC Platelet - w/Differential (85025-CBCD) TLB-BMP (Basic Metabolic Panel-BMET) (80048-METABOL) TLB-Hepatic/Liver Function Pnl (80076-HEPATIC) TLB-TSH (Thyroid Stimulating Hormone) (84443-TSH) TLB-IBC Pnl (Iron/FE;Transferrin) (83550-IBC) TLB-B12 + Folate Pnl (78295_62130-Q65/HQI) TLB-A1C / Hgb A1C (Glycohemoglobin) (83036-A1C) TLB-Microalbumin/Creat Ratio, Urine (82043-MALB) TLB-Udip w/ Micro (81001-URINE) T-Urine Culture (Spectrum Order) [id number])  Labs Reviewed: Creat: 0.7 (02/01/2009)     Last Eye Exam: diabetic retinopathy (02/22/2010) Reviewed HgBA1c results: 6.4 (02/01/2009)  6.6 (08/05/2008)  Problem # 6:  ANEMIA-NOS (ICD-285.9) Assessment: Unchanged  Orders: Venipuncture (32440) TLB-Lipid Panel (80061-LIPID) TLB-CBC  Platelet - w/Differential (85025-CBCD) TLB-BMP (Basic Metabolic Panel-BMET) (80048-METABOL) TLB-Hepatic/Liver Function Pnl (80076-HEPATIC) TLB-TSH (Thyroid Stimulating Hormone) (84443-TSH) TLB-IBC Pnl (Iron/FE;Transferrin) (83550-IBC) TLB-B12 + Folate Pnl (10272_53664-Q03/KVQ) TLB-A1C / Hgb A1C (Glycohemoglobin) (83036-A1C) TLB-Microalbumin/Creat Ratio, Urine (82043-MALB) TLB-Udip w/ Micro (81001-URINE) T-Urine Culture (Spectrum Order) [id number])  Hgb: 12.7 (02/01/2009)   Hct: 36.7 (02/01/2009)   Platelets: 290.0 (02/01/2009) RBC: 4.28 (02/01/2009)   RDW: 12.2 (02/01/2009)   WBC: 9.0 (02/01/2009) MCV: 85.8 (02/01/2009)   MCHC: 34.6 (02/01/2009) Iron: 70 (08/05/2008)   % Sat: 23.0 (08/05/2008) B12: 372 (08/05/2008)   Folate: 18.4 (08/05/2008)   TSH: 0.06 (08/05/2008)  Complete Medication List: 1)  Metformin Hcl 1000 Mg Tabs (Metformin hcl) .... Take 1 tablet by mouth two times a day 2)  Glyburide 5 Mg Tabs (Glyburide) .... Take 1 tablet by mouth once a day 3)  Diovan 160 Mg Tabs (Valsartan) .... One by mouth once daily 4)  Vytorin 10-40 Mg Tabs (Ezetimibe-simvastatin) .... One by mouth once daily for cholesterol  Other Orders: Gastroenterology Referral (GI)  Hypertension Assessment/Plan:      The patient's hypertensive risk group is category C: Target organ damage and/or diabetes.  Her calculated 10 year risk of coronary heart disease is 8 %.  Today's blood pressure is 114/68.  Her blood pressure goal is < 130/80.  Colorectal Screening:  Current Recommendations:    Hemoccult: NEG X 1 today    Colonoscopy recommended: scheduled with G.[facility].  PAP Screening:    Hx Cervical Dysplasia in last 5 yrs? No    3 normal PAP smears in last 5 yrs? Yes    Last PAP smear:  05/13/2008    Reviewed PAP smear recommendations:  patient defers to GYN provider  Mammogram Screening:    Last Mammogram:  09/29/2009  Osteoporosis Risk Assessment:  Risk Factors for Fracture or Low Bone Density:    Smoking status:       quit > 6 months   Thyroid disease:     yes  Immunization & Chemoprophylaxis:    Tetanus vaccine: Tdap  (08/05/2008)  Patient Instructions: 1)  Please schedule a follow-up appointment in 2 months. 2)  It is important that you exercise regularly at least 20 minutes 5 times a week. If you develop chest pain, have severe difficulty breathing, or feel very tired , stop exercising immediately and seek medical attention. 3)  Check your blood sugars regularly. If your readings are usually above 200 or below 70 you should contact our office.  4)  It is important that your Diabetic A1c level is checked every 3 months. 5)  See your eye doctor yearly to check for diabetic eye damage. 6)  Check your feet each night for sore areas, calluses or signs of infection. 7)  Check your Blood Pressure regularly. If it is above 130/80: you should make an appointment. Prescriptions: VYTORIN 10-40 MG TABS (EZETIMIBE-SIMVASTATIN) One by mouth once daily for cholesterol  #112 x 0   Entered and Authorized by:   Etta Grandchild MD   Signed by:   Etta Grandchild MD on 03/01/2010   Method used:   Samples Given   RxID:   [id number] DIOVAN 160 MG TABS (VALSARTAN) One by mouth once daily  #112 x 0   Entered and Authorized by:   Etta Grandchild MD   Signed by:   Etta Grandchild MD on 03/01/2010   Method used:   Samples Given   RxID:   [id number]    Orders Added: 1)  Gastroenterology Referral [GI] 2)  Venipuncture [36415] 3)  TLB-Lipid Panel [80061-LIPID] 4)  TLB-CBC Platelet - w/Differential [85025-CBCD] 5)  TLB-BMP (Basic Metabolic Panel-BMET) [80048-METABOL] 6)  TLB-Hepatic/Liver Function Pnl [80076-HEPATIC] 7)  TLB-TSH (Thyroid Stimulating Hormone) [84443-TSH] 8)  TLB-IBC Pnl (Iron/FE;Transferrin) [83550-IBC] 9)  TLB-B12 + Folate Pnl [82746_82607-B12/FOL] 10)  TLB-A1C / Hgb A1C (Glycohemoglobin) [83036-A1C] 11)  TLB-Microalbumin/Creat Ratio, Urine [82043-MALB] 12)  TLB-Udip w/  Micro [81001-URINE] 13)  T-Urine Culture (Spectrum Order) [[id number]] 14)  Urology Referral [Urology] 15)  Est. Patient Level V [41324]    Preventive Care Screening  Mammogram:    Date:  09/29/2009    Results:  normal

## 2010-06-02 NOTE — Progress Notes (Signed)
  Phone Note Outgoing Call   Summary of Call: she was notified of positive urine culture and the need to start the antibiotics Initial call taken by: Etta Grandchild MD,  March 04, 2010 7:46 AM    New/Updated Medications: CIPROFLOXACIN HCL 500 MG TABS (CIPROFLOXACIN HCL) One by mouth two times a day for 10 days Prescriptions: CIPROFLOXACIN HCL 500 MG TABS (CIPROFLOXACIN HCL) One by mouth two times a day for 10 days  #20 x 1   Entered and Authorized by:   Etta Grandchild MD   Signed by:   Etta Grandchild MD on 03/04/2010   Method used:   Electronically to        [facility] (retail)       [address]       [place], [place]  [address]       Ph: [phone]       Fax: [phone]   RxID:   [id number]

## 2010-06-02 NOTE — Letter (Signed)
Summary: Referral - not able to see patient  [facility]  [address] [place], [place] [address]   Phone: [phone]  Fax: [phone]    May 03, 2010  Sanda Linger, MD [address] [place], [place] [address]   Re:   Theresa Harrington DOB:  [birth year] MRN:   [id number]    Dear Dr. Yetta Barre:  Thank you for your kind referral of the above patient.  We have attempted to schedule the recommended procedure Screening Colonoscopy but have not been able to schedule because:  ___ The patient was not available by phone and/or has not returned our calls.  X   The patient declined to schedule the procedure at this time.  We appreciate the referral and hope that we will have the opportunity to treat this patient in the future.    Sincerely,    [facility] [phone]

## 2010-06-02 NOTE — Progress Notes (Signed)
Summary: Benicar PA  Phone Note From Pharmacy   Caller: [employer] [place] [phone] Summary of Call: PA request--Benicar. They will fax forms. Initial call taken by: Lucious Groves,  May 24, 2009 8:54 AM  Follow-up for Phone Call        PA will be denied unless patient has tried and failed one of the following: losartan, valsartan, telmisartan. Change patient to one of these meds or contine with PA form that has been signed? Follow-up by: Lucious Groves,  May 24, 2009 10:00 AM  Additional Follow-up for Phone Call Additional follow up Details #1::        changed to valsartan Additional Follow-up by: Etta Grandchild MD,  May 24, 2009 10:10 AM    Additional Follow-up for Phone Call Additional follow up Details #2::    Left message on machine to call back to office.Lucious Groves,  May 24, 2009 10:32 AM  Left message on machine to call back to office.Lucious Groves,  May 25, 2009 2:36 PM  Patient notified.Lucious Groves,  May 26, 2009 10:47 AM  New/Updated Medications: DIOVAN 160 MG TABS (VALSARTAN) One by mouth once daily for high blood pressure Prescriptions: DIOVAN 160 MG TABS (VALSARTAN) One by mouth once daily for high blood pressure  #30 x 11   Entered and Authorized by:   Etta Grandchild MD   Signed by:   Etta Grandchild MD on 05/24/2009   Method used:   Electronically to        [facility].* (retail)       [address]       [place], [place]  [address]       Ph: [phone]       Fax: [phone]   RxID:   [id number]

## 2010-06-02 NOTE — Assessment & Plan Note (Signed)
Summary: COLD/ COUGH /NWS   Vital Signs:  Patient profile:   55 year old female Menstrual status:  hysterectomy Height:      66 inches Weight:      164 pounds BMI:     26.57 O2 Sat:      97.2 % on Room air Temp:     97.2 degrees F oral Pulse rate:   56 / minute Pulse rhythm:   regular Resp:     16 per minute BP sitting:   132 / 70  (left arm) Cuff size:   large  Vitals Entered By: Rock Nephew CMA (April 18, 2010 10:01 AM)  Nutrition Counseling: Patient's BMI is greater than 25 and therefore counseled on weight management options.  O2 Flow:  Room air  Primary Care Provider:  Etta Grandchild MD  CC:  URI symptoms.  History of Present Illness:  URI Symptoms      This is a 55 year old woman who presents with URI symptoms.  The symptoms began 3 days ago.  The severity is described as mild.  The patient reports nasal congestion, purulent nasal discharge, sore throat, and productive cough, but denies clear nasal discharge, earache, and sick contacts.  The patient denies fever, stiff neck, dyspnea, wheezing, rash, vomiting, diarrhea, use of an antipyretic, and response to antipyretic.  The patient denies sneezing, seasonal symptoms, headache, muscle aches, and severe fatigue.  Risk factors for Strep sinusitis include unilateral facial pain, unilateral nasal discharge, poor response to decongestant, and double sickening.  The patient denies the following risk factors for Strep sinusitis: tooth pain, Strep exposure, tender adenopathy, and absence of cough.    Lipid Management History:      Positive NCEP/ATP III risk factors include early menopause without estrogen hormone replacement, diabetes, and hypertension.  Negative NCEP/ATP III risk factors include female age less than 78 years old, no family history for ischemic heart disease, non-tobacco-user status, no ASHD (atherosclerotic heart disease), no prior stroke/TIA, no peripheral vascular disease, and no history of aortic aneurysm.         The patient states that she knows about the "Therapeutic Lifestyle Change" diet.  Her compliance with the TLC diet is good.  The patient expresses understanding of adjunctive measures for cholesterol lowering.  Adjunctive measures started by the patient include aerobic exercise, fiber, limit alcohol consumpton, and weight reduction.  She expresses no side effects from her lipid-lowering medication.  The patient denies any symptoms to suggest myopathy or liver disease.     Preventive Screening-Counseling & Management  Alcohol-Tobacco     Alcohol drinks/day: 0     Alcohol Counseling: not indicated; patient does not drink     Smoking Status: quit > 6 months     Year Quit: 2006     Tobacco Counseling: to remain off tobacco products  Hep-HIV-STD-Contraception     Hepatitis Risk: no risk noted     HIV Risk: no risk noted     STD Risk: no risk noted      Sexual History:  currently monogamous.        Drug Use:  no.        Blood Transfusions:  no.    Clinical Review Panels:  Prevention   Last Mammogram:  normal (09/29/2009)   Last Pap Smear:  Normal (05/13/2008)  Immunizations   Last Tetanus Booster:  Tdap (08/05/2008)  Lipid Management   Cholesterol:  235 (03/01/2010)   LDL (bad choesterol):  118 (08/05/2008)   HDL (  good cholesterol):  49.30 (03/01/2010)  Diabetes Management   HgBA1C:  7.0 (03/01/2010)   Creatinine:  0.7 (03/01/2010)   Last Dilated Eye Exam:  diabetic retinopathy (02/22/2010)   Last Foot Exam:  yes (04/18/2010)  CBC   WBC:  8.2 (03/01/2010)   RBC:  3.91 (03/01/2010)   Hgb:  11.1 (03/01/2010)   Hct:  32.6 (03/01/2010)   Platelets:  285.0 (03/01/2010)   MCV  83.4 (03/01/2010)   MCHC  34.1 (03/01/2010)   RDW  14.4 (03/01/2010)   PMN:  51.8 (03/01/2010)   Lymphs:  40.0 (03/01/2010)   Monos:  6.1 (03/01/2010)   Eosinophils:  1.7 (03/01/2010)   Basophil:  0.4 (03/01/2010)  Complete Metabolic Panel   Glucose:  60 (03/01/2010)   Sodium:  142  (03/01/2010)   Potassium:  4.0 (03/01/2010)   Chloride:  107 (03/01/2010)   CO2:  29 (03/01/2010)   BUN:  17 (03/01/2010)   Creatinine:  0.7 (03/01/2010)   Albumin:  3.8 (03/01/2010)   Total Protein:  6.7 (03/01/2010)   Calcium:  9.7 (03/01/2010)   Total Bili:  0.6 (03/01/2010)   Alk Phos:  56 (03/01/2010)   SGPT (ALT):  11 (03/01/2010)   SGOT (AST):  14 (03/01/2010)   Medications Prior to Update: 1)  Metformin Hcl 1000 Mg Tabs (Metformin Hcl) .... Take 1 Tablet By Mouth Two Times A Day 2)  Glyburide 5 Mg Tabs (Glyburide) .... Take 1 Tablet By Mouth Once A Day 3)  Diovan 160 Mg Tabs (Valsartan) .... One By Mouth Once Daily 4)  Vytorin 10-40 Mg Tabs (Ezetimibe-Simvastatin) .... One By Mouth Once Daily For Cholesterol 5)  Ciprofloxacin Hcl 500 Mg Tabs (Ciprofloxacin Hcl) .... One By Mouth Two Times A Day For 10 Days  Current Medications (verified): 1)  Metformin Hcl 1000 Mg Tabs (Metformin Hcl) .... Take 1 Tablet By Mouth Two Times A Day 2)  Glyburide 5 Mg Tabs (Glyburide) .... Take 1 Tablet By Mouth Once A Day 3)  Diovan 160 Mg Tabs (Valsartan) .... One By Mouth Once Daily 4)  Vytorin 10-40 Mg Tabs (Ezetimibe-Simvastatin) .... One By Mouth Once Daily For Cholesterol 5)  Sulfamethoxazole-Tmp Ds 800-160 Mg Tab (Trimethoprim-Sulfamethoxazole) .... Take 1 Tablet By Mouth Morning and Night 6)  Slow Release Iron 160 (50 Fe) Mg Cr-Tabs (Ferrous Sulfate Dried) .... One By Mouth Once Daily 7)  Hydrocod Polst-Chlorphen Polst 10-8 Mg/51ml Lqcr (Hydrocod Polst-Chlorphen Polst) .... 5 Ml By Mouth Two Times A Day As Needed For Cough  Allergies (verified): 1)  ! Codeine 2)  ! Lisinopril (Lisinopril)  Past History:  Past Medical History: Last updated: 08/05/2008 Anemia-NOS Diabetes mellitus, type II Hyperlipidemia Hypertension Hypothyroidism  Past Surgical History: Last updated: 08/05/2008 Gastric bypass  Family History: Last updated: 11/04/2008 Family History of Arthritis Family  History of Colon CA 1st degree relative <60 Family History Diabetes 1st degree relative Family History Hypertension Family History Kidney disease Family History of Stroke M 1st degree relative <50 Family History of Cardiovascular disorder no thyroid dz  Social History: Last updated: 08/05/2008 Occupation: Secondary school teacher Single Alcohol use-no Drug use-no Regular exercise-yes  Risk Factors: Alcohol Use: 0 (04/18/2010) Exercise: yes (08/05/2008)  Risk Factors: Smoking Status: quit > 6 months (04/18/2010)  Family History: Reviewed history from 11/04/2008 and no changes required. Family History of Arthritis Family History of Colon CA 1st degree relative <60 Family History Diabetes 1st degree relative Family History Hypertension Family History Kidney disease Family History of Stroke M 1st degree relative <50 Family History  of Cardiovascular disorder no thyroid dz  Social History: Reviewed history from 08/05/2008 and no changes required. Occupation: Secondary school teacher Single Alcohol use-no Drug use-no Regular exercise-yes  Review of Systems  The patient denies anorexia, fever, weight loss, weight gain, chest pain, dyspnea on exertion, peripheral edema, headaches, hemoptysis, abdominal pain, hematuria, suspicious skin lesions, and enlarged lymph nodes.   GU:  Denies discharge, dysuria, genital sores, hematuria, incontinence, nocturia, urinary frequency, and urinary hesitancy. Endo:  Denies cold intolerance, excessive hunger, excessive thirst, excessive urination, heat intolerance, polyuria, and weight change. Heme:  Denies abnormal bruising, bleeding, enlarge lymph nodes, fevers, pallor, and skin discoloration.  Physical Exam  General:  alert, well-developed, well-nourished, well-hydrated, appropriate dress, normal appearance, healthy-appearing, cooperative to examination, and good hygiene.   Head:  normocephalic, atraumatic, no abnormalities observed, and no abnormalities palpated.   Eyes:   vision grossly intact, pupils equal, pupils round, and pupils reactive to light.   Ears:  R ear normal and L ear normal.   Nose:  no external deformity, no airflow obstruction, no intranasal foreign body, no nasal polyps, no nasal mucosal lesions, nasal dischargemucosal pallor, L maxillary sinus tenderness, and R maxillary sinus tenderness.   Mouth:  Oral mucosa and oropharynx without lesions or exudates.  Teeth in good repair. Neck:  supple, full ROM, no masses, no thyromegaly, no thyroid nodules or tenderness, no JVD, and normal carotid upstroke.   Lungs:  normal respiratory effort, no intercostal retractions, no accessory muscle use, normal breath sounds, no dullness, no fremitus, no crackles, and no wheezes.   Heart:  normal rate, regular rhythm, no murmur, no gallop, no rub, and no JVD.   Abdomen:  soft, non-tender, normal bowel sounds, no distention, no masses, no guarding, no rigidity, no rebound tenderness, no abdominal hernia, no inguinal hernia, no hepatomegaly, no splenomegaly, and abdominal scar(s).  LapBand device noted over right lower quadrant. Msk:  normal ROM, no joint tenderness, no joint swelling, and no joint warmth.   Pulses:  R and L carotid,radial,femoral,dorsalis pedis and posterior tibial pulses are full and equal bilaterally Extremities:  No clubbing, cyanosis, edema, or deformity noted with normal full range of motion of all joints.   Neurologic:  alert & oriented X3, cranial nerves II-XII intact, strength normal in all extremities, gait normal, and DTRs symmetrical and normal.   Skin:  turgor normal, color normal, no rashes, no suspicious lesions, no ecchymoses, and no petechiae.   Cervical Nodes:  No significant adenopathy.  Axillary Nodes:  no R axillary adenopathy and no L axillary adenopathy.   Psych:  Alert and cooperative; normal mood and affect; normal attention span and concentration.    Diabetes Management Exam:    Foot Exam (with socks and/or shoes not  present):       Sensory-Pinprick/Light touch:          Left medial foot (L-4): normal          Left dorsal foot (L-5): normal          Left lateral foot (S-1): normal          Right medial foot (L-4): normal          Right dorsal foot (L-5): normal          Right lateral foot (S-1): normal       Sensory-Monofilament:          Left foot: normal          Right foot: normal       Inspection:  Left foot: normal          Right foot: normal       Nails:          Left foot: normal          Right foot: normal   Impression & Recommendations:  Problem # 1:  UTI (ICD-599.0) Assessment Improved  The following medications were removed from the medication list:    Ciprofloxacin Hcl 500 Mg Tabs (Ciprofloxacin hcl) ..... One by mouth two times a day for 10 days Her updated medication list for this problem includes:    Sulfamethoxazole-tmp Ds 800-160 Mg Tab (Trimethoprim-sulfamethoxazole) .[name]... Take 1 tablet by mouth morning and night  Problem # 2:  DIABETES MELLITUS, TYPE II (ICD-250.00) Assessment: Improved  Her updated medication list for this problem includes:    Metformin Hcl 1000 Mg Tabs (Metformin hcl) .[name]... Take 1 tablet by mouth two times a day    Glyburide 5 Mg Tabs (Glyburide) .[name]... Take 1 tablet by mouth once a day    Diovan 160 Mg Tabs (Valsartan) ..... One by mouth once daily  Labs Reviewed: Creat: 0.7 (03/01/2010)     Last Eye Exam: diabetic retinopathy (02/22/2010) Reviewed HgBA1c results: 7.0 (03/01/2010)  6.4 (02/01/2009)  Problem # 3:  BRONCHITIS-ACUTE (ICD-466.0) Assessment: New  The following medications were removed from the medication list:    Ciprofloxacin Hcl 500 Mg Tabs (Ciprofloxacin hcl) ..... One by mouth two times a day for 10 days Her updated medication list for this problem includes:    Sulfamethoxazole-tmp Ds 800-160 Mg Tab (Trimethoprim-sulfamethoxazole) .[name]... Take 1 tablet by mouth morning and night    Hydrocod Polst-chlorphen Polst 10-8  Mg/60ml Lqcr (Hydrocod polst-chlorphen polst) .[name] KitchenMarland KitchenMarland KitchenMarland Kitchen 5 ml by mouth two times a day as needed for cough  Take antibiotics and other medications as directed. Encouraged to push clear liquids, get enough rest, and take acetaminophen as needed. To be seen in 5-7 days if no improvement, sooner if worse.  Problem # 4:  HYPERTHYROIDISM (ICD-242.90) Assessment: Unchanged  Labs Reviewed: TSH: 0.11 (03/01/2010)     Problem # 5:  ANEMIA-NOS (ICD-285.9) Assessment: Unchanged  Her updated medication list for this problem includes:    Slow Release Iron 160 (50 Fe) Mg Cr-tabs (Ferrous sulfate dried) ..... One by mouth once daily  Hgb: 11.1 (03/01/2010)   Hct: 32.6 (03/01/2010)   Platelets: 285.0 (03/01/2010) RBC: 3.91 (03/01/2010)   RDW: 14.4 (03/01/2010)   WBC: 8.2 (03/01/2010) MCV: 83.4 (03/01/2010)   MCHC: 34.1 (03/01/2010) Iron: 35 (03/01/2010)   % Sat: 9.9 (03/01/2010) B12: 322 (03/01/2010)   Folate: 8.4 (03/01/2010)   TSH: 0.11 (03/01/2010)  Complete Medication List: 1)  Metformin Hcl 1000 Mg Tabs (Metformin hcl) .... Take 1 tablet by mouth two times a day 2)  Glyburide 5 Mg Tabs (Glyburide) .... Take 1 tablet by mouth once a day 3)  Diovan 160 Mg Tabs (Valsartan) .... One by mouth once daily 4)  Vytorin 10-40 Mg Tabs (Ezetimibe-simvastatin) .... One by mouth once daily for cholesterol 5)  Sulfamethoxazole-tmp Ds 800-160 Mg Tab (Trimethoprim-sulfamethoxazole) .... Take 1 tablet by mouth morning and night 6)  Slow Release Iron 160 (50 Fe) Mg Cr-tabs (Ferrous sulfate dried) .... One by mouth once daily 7)  Hydrocod Polst-chlorphen Polst 10-8 Mg/42ml Lqcr (Hydrocod polst-chlorphen polst) .... 5 ml by mouth two times a day as needed for cough  Lipid Assessment/Plan:      Based on NCEP/ATP III, the patient's risk factor category is "history of diabetes".  The patient's lipid goals are as follows: Total cholesterol goal is 200; LDL cholesterol goal is 100; HDL cholesterol goal is 40; Triglyceride goal  is 150.    Patient Instructions: 1)  Please schedule a follow-up appointment in 2 months. 2)  It is important that you exercise regularly at least 20 minutes 5 times a week. If you develop chest pain, have severe difficulty breathing, or feel very tired , stop exercising immediately and seek medical attention. 3)  You need to lose weight. Consider a lower calorie diet and regular exercise.  4)  Check your Blood Pressure regularly. If it is above 140/90: you should make an appointment. 5)  Take your antibiotic as prescribed until ALL of it is gone, but stop if you develop a rash or swelling and contact our office as soon as possible. 6)  Acute sinusitis symptoms for less than 10 days are not helped by antibiotics.Use warm moist compresses, and over the counter decongestants ( only as directed). Call if no improvement in 5-7 days, sooner if increasing pain, fever, or new symptoms. Prescriptions: HYDROCOD POLST-CHLORPHEN POLST 10-8 MG/5ML LQCR (HYDROCOD POLST-CHLORPHEN POLST) 5 ml by mouth two times a day as needed for cough  #4 ounces x 0   Entered and Authorized by:   Etta Grandchild MD   Signed by:   Etta Grandchild MD on 04/18/2010   Method used:   Print then Give to Patient   RxID:   [id number] SLOW RELEASE IRON 160 (50 FE) MG CR-TABS (FERROUS SULFATE DRIED) one by mouth once daily  #30 x 11   Entered and Authorized by:   Etta Grandchild MD   Signed by:   Etta Grandchild MD on 04/18/2010   Method used:   Electronically to        [facility] (retail)       [address]       [place], [place]  [address]       Ph: [phone]       Fax: [phone]   RxID:   [id number] SULFAMETHOXAZOLE-TMP DS 800-160 MG TAB (TRIMETHOPRIM-SULFAMETHOXAZOLE) Take 1 tablet by mouth morning and night  #20 x 0   Entered and Authorized by:   Etta Grandchild MD   Signed by:   Etta Grandchild MD on 04/18/2010   Method used:   Electronically to        [facility].* (retail)       [address]       [place], [place]  [address]       Ph: [phone]       Fax: [phone]   RxID:   [id number]    Orders Added: 1)  Est. Patient Level V [32[address]]

## 2010-06-02 NOTE — Letter (Signed)
Summary: [facility]   Imported By: Sherian Rein 03/18/2010 13:13:28  _____________________________________________________________________  External Attachment:    Type:   Image     Comment:   External Document

## 2010-07-11 LAB — COMPREHENSIVE METABOLIC PANEL
ALT: 12 U/L (ref 0–35)
AST: 18 U/L (ref 0–37)
Albumin: 3.9 g/dL (ref 3.5–5.2)
Alkaline Phosphatase: 49 U/L (ref 39–117)
BUN: 10 mg/dL (ref 6–23)
CO2: 28 mEq/L (ref 19–32)
Calcium: 9.8 mg/dL (ref 8.4–10.5)
Chloride: 105 mEq/L (ref 96–112)
Creatinine, Ser: 0.75 mg/dL (ref 0.4–1.2)
GFR calc Af Amer: >60 mL/min (ref >60)
GFR calc non Af Amer: >60 mL/min (ref >60)
Glucose, Bld: 80 mg/dL (ref 70–99)
Potassium: 3.9 mEq/L (ref 3.5–5.1)
Sodium: 142 mEq/L (ref 135–145)
Total Bilirubin: 0.6 mg/dL (ref 0.3–1.2)
Total Protein: 6.6 g/dL (ref 6.0–8.3)

## 2010-07-11 LAB — DIFFERENTIAL
Basophils Absolute: 0 10*3/uL (ref 0.0–0.1)
Basophils Relative: 0 % (ref 0–1)
Eosinophils Absolute: 0.1 10*3/uL (ref 0.0–0.7)
Eosinophils Relative: 1 % (ref 0–5)
Lymphocytes Relative: 34 % (ref 12–46)
Lymphs Abs: 2.7 10*3/uL (ref 0.7–4.0)
Monocytes Absolute: 0.5 10*3/uL (ref 0.1–1.0)
Monocytes Relative: 6 % (ref 3–12)
Neutro Abs: 4.6 10*3/uL (ref 1.7–7.7)
Neutrophils Relative %: 58 % (ref 43–77)

## 2010-07-11 LAB — CBC
HCT: 34.1 % — ABNORMAL LOW (ref 36.0–46.0)
Hemoglobin: 11.3 g/dL — ABNORMAL LOW (ref 12.0–15.0)
MCH: 27.4 pg (ref 26.0–34.0)
MCHC: 33.1 g/dL (ref 30.0–36.0)
MCV: 82.8 fL (ref 78.0–100.0)
Platelets: 245 10*3/uL (ref 150–400)
RBC: 4.12 MIL/uL (ref 3.87–5.11)
RDW: 13.4 % (ref 11.5–15.5)
WBC: 7.8 10*3/uL (ref 4.0–10.5)

## 2010-07-11 LAB — ANAEROBIC CULTURE

## 2010-07-11 LAB — GLUCOSE, CAPILLARY
Glucose-Capillary: 102 mg/dL — ABNORMAL HIGH (ref 70–99)
Glucose-Capillary: 95 mg/dL (ref 70–99)

## 2010-07-11 LAB — WOUND CULTURE: Culture: NO GROWTH

## 2010-07-11 LAB — SURGICAL PCR SCREEN
MRSA, PCR: NEGATIVE
Staphylococcus aureus: NEGATIVE

## 2010-08-01 ENCOUNTER — Ambulatory Visit (INDEPENDENT_AMBULATORY_CARE_PROVIDER_SITE_OTHER): Payer: BC Managed Care – PPO | Admitting: Internal Medicine

## 2010-08-01 ENCOUNTER — Other Ambulatory Visit: Payer: BC Managed Care – PPO

## 2010-08-01 ENCOUNTER — Encounter: Payer: Self-pay | Admitting: Internal Medicine

## 2010-08-01 ENCOUNTER — Other Ambulatory Visit (INDEPENDENT_AMBULATORY_CARE_PROVIDER_SITE_OTHER): Payer: BC Managed Care – PPO

## 2010-08-01 VITALS — BP 120/80 | HR 54 | Temp 97.6°F | Ht 66.0 in | Wt 168.0 lb

## 2010-08-01 DIAGNOSIS — E119 Type 2 diabetes mellitus without complications: Secondary | ICD-10-CM

## 2010-08-01 DIAGNOSIS — D649 Anemia, unspecified: Secondary | ICD-10-CM

## 2010-08-01 DIAGNOSIS — I739 Peripheral vascular disease, unspecified: Secondary | ICD-10-CM

## 2010-08-01 DIAGNOSIS — I10 Essential (primary) hypertension: Secondary | ICD-10-CM

## 2010-08-01 DIAGNOSIS — E785 Hyperlipidemia, unspecified: Secondary | ICD-10-CM

## 2010-08-01 DIAGNOSIS — F329 Major depressive disorder, single episode, unspecified: Secondary | ICD-10-CM

## 2010-08-01 DIAGNOSIS — N390 Urinary tract infection, site not specified: Secondary | ICD-10-CM

## 2010-08-01 DIAGNOSIS — E0590 Thyrotoxicosis, unspecified without thyrotoxic crisis or storm: Secondary | ICD-10-CM

## 2010-08-01 LAB — LIPID PANEL
Cholesterol: 158 mg/dL (ref 0–200)
LDL Cholesterol: 77 mg/dL (ref 0–99)
Triglycerides: 129 mg/dL (ref 0.0–149.0)

## 2010-08-01 LAB — CBC WITH DIFFERENTIAL/PLATELET
Eosinophils Relative: 1.2 % (ref 0.0–5.0)
Lymphocytes Relative: 32.8 % (ref 12.0–46.0)
MCV: 82.6 fl (ref 78.0–100.0)
Monocytes Absolute: 0.4 10*3/uL (ref 0.1–1.0)
Monocytes Relative: 5.1 % (ref 3.0–12.0)
Neutrophils Relative %: 60.5 % (ref 43.0–77.0)
Platelets: 251 10*3/uL (ref 150.0–400.0)
WBC: 8.1 10*3/uL (ref 4.5–10.5)

## 2010-08-01 LAB — TSH: TSH: 0.14 u[IU]/mL — ABNORMAL LOW (ref 0.35–5.50)

## 2010-08-01 LAB — URINALYSIS, ROUTINE W REFLEX MICROSCOPIC
Bilirubin Urine: NEGATIVE
Nitrite: NEGATIVE
Urobilinogen, UA: 0.2 (ref 0.0–1.0)

## 2010-08-01 LAB — COMPREHENSIVE METABOLIC PANEL
Albumin: 3.9 g/dL (ref 3.5–5.2)
Alkaline Phosphatase: 68 U/L (ref 39–117)
BUN: 16 mg/dL (ref 6–23)
Calcium: 9.4 mg/dL (ref 8.4–10.5)
Chloride: 103 mEq/L (ref 96–112)
Glucose, Bld: 142 mg/dL — ABNORMAL HIGH (ref 70–99)
Potassium: 4.1 mEq/L (ref 3.5–5.1)

## 2010-08-01 MED ORDER — DESVENLAFAXINE SUCCINATE ER 50 MG PO TB24: 50.0000 mg | ORAL_TABLET | Freq: Every day | ORAL | Status: DC

## 2010-08-01 MED ORDER — VALSARTAN 160 MG PO TABS: 160.0000 mg | ORAL_TABLET | Freq: Every day | ORAL | Status: DC

## 2010-08-01 NOTE — Progress Notes (Signed)
Subjective:    Patient ID: Theresa Harrington, female    DOB: 03-06-1956, 55 y.o.   MRN: [id number]  Diabetes She presents for her follow-up diabetic visit. She has type 2 diabetes mellitus. No MedicAlert identification noted. Her disease course has been stable. There are no hypoglycemic associated symptoms. Pertinent negatives for hypoglycemia include no confusion, dizziness, nervousness/anxiousness, seizures, speech difficulty or tremors. Pertinent negatives for diabetes include no blurred vision, no chest pain, no fatigue, no foot paresthesias, no foot ulcerations, no polydipsia, no polyphagia, no polyuria, no visual change, no weakness and no weight loss. There are no hypoglycemic complications. There are no diabetic complications. Current diabetic treatment includes oral agent (monotherapy). She is compliant with treatment all of the time. Her weight is stable. She is following a diabetic diet. She participates in exercise intermittently. There is no change in her home blood glucose trend. An ACE inhibitor/angiotensin II receptor blocker is being taken.      Review of Systems  Constitutional: Negative for fever, chills, weight loss, diaphoresis, activity change, appetite change, fatigue and unexpected weight change.  Eyes: Negative for blurred vision.  Respiratory: Negative for apnea, cough, choking, chest tightness, shortness of breath, wheezing and stridor.   Cardiovascular: Negative for chest pain, palpitations and leg swelling.  Gastrointestinal: Negative for nausea, vomiting, abdominal pain, diarrhea, constipation, blood in stool, abdominal distention, anal bleeding and rectal pain.  Genitourinary: Positive for frequency. Negative for dysuria, urgency, polyuria, hematuria, flank pain, decreased urine volume, vaginal bleeding, vaginal discharge, difficulty urinating, genital sores, vaginal pain, menstrual problem, pelvic pain and dyspareunia.  Musculoskeletal: Positive for gait problem (left calf  cramps when she walks). Negative for myalgias, back pain, joint swelling and arthralgias.  Neurological: Negative for dizziness, tremors, seizures, syncope, facial asymmetry, speech difficulty, weakness, light-headedness and numbness.  Hematological: Negative for polydipsia, polyphagia and adenopathy. Does not bruise/bleed easily.  Psychiatric/Behavioral: Positive for dysphoric mood (feels depressed and has non-restorative sleep). Negative for suicidal ideas, hallucinations, behavioral problems, confusion, sleep disturbance, self-injury and agitation. The patient is not nervous/anxious and is not hyperactive.        Objective:   Physical Exam  Constitutional: She is oriented to person, place, and time. She appears well-developed and well-nourished. No distress.  HENT:  Head: Normocephalic and atraumatic.  Right Ear: External ear normal.  Left Ear: External ear normal.  Nose: Nose normal.  Mouth/Throat: No oropharyngeal exudate.  Eyes: Conjunctivae and EOM are normal. Pupils are equal, round, and reactive to light. Right eye exhibits no discharge. Left eye exhibits no discharge. No scleral icterus.  Neck: Normal range of motion. No thyromegaly present.  Cardiovascular: Normal rate, regular rhythm, S1 normal, S2 normal and normal heart sounds.  Exam reveals decreased pulses. Exam reveals no gallop, no distant heart sounds and no friction rub.   No murmur heard. Pulses:      Carotid pulses are 2+ on the right side, and 2+ on the left side.      Radial pulses are 2+ on the right side, and 2+ on the left side.       Femoral pulses are 1+ on the right side, and 1+ on the left side.      Popliteal pulses are 0 on the right side, and 0 on the left side.       Dorsalis pedis pulses are 0 on the right side, and 0 on the left side.       Posterior tibial pulses are 0 on the right side, and 0 on  the left side.  Pulmonary/Chest: Effort normal and breath sounds normal. No respiratory distress. She has  no wheezes. She has no rales. She exhibits no tenderness.  Abdominal: Soft. Bowel sounds are normal. She exhibits no distension and no mass. There is no tenderness. There is no rebound and no guarding.  Musculoskeletal: Normal range of motion. She exhibits no edema and no tenderness.  Lymphadenopathy:    She has no cervical adenopathy.  Neurological: She is alert and oriented to person, place, and time. She has normal reflexes. She displays normal reflexes. No cranial nerve deficit. She exhibits normal muscle tone. Coordination normal.  Skin: Skin is warm and dry. No rash noted. She is not diaphoretic. No erythema. No pallor.  Psychiatric: She has a normal mood and affect. Her behavior is normal. Judgment and thought content normal.          Assessment & Plan:

## 2010-08-01 NOTE — Assessment & Plan Note (Signed)
She has very weak pulses in her feet and complains of claudication, she good capillary refill in her toes and no acute lesions, will schedule her for arterial flow studies

## 2010-08-01 NOTE — Assessment & Plan Note (Signed)
Will check a UA and culture today and treat if needed

## 2010-08-01 NOTE — Assessment & Plan Note (Signed)
BP is well controlled, will monitor lytes and renal function today

## 2010-08-01 NOTE — Assessment & Plan Note (Signed)
It sounds like she is doing well, will check her A1C today and follow from there

## 2010-08-01 NOTE — Assessment & Plan Note (Addendum)
Start Pristiq, she was given pt ed material today on depression and she will consider psychotherapy

## 2010-08-01 NOTE — Assessment & Plan Note (Signed)
Will follow this with a CBC today, she has no s/s of blood loss

## 2010-08-01 NOTE — Patient Instructions (Signed)

## 2010-08-01 NOTE — Assessment & Plan Note (Signed)
Will check FLP today and monitor LFT's

## 2010-08-01 NOTE — Assessment & Plan Note (Signed)
She sounds euthyroid today, will check her TSH

## 2010-08-03 ENCOUNTER — Telehealth: Payer: Self-pay | Admitting: Internal Medicine

## 2010-08-03 ENCOUNTER — Other Ambulatory Visit: Payer: Self-pay | Admitting: Internal Medicine

## 2010-08-03 DIAGNOSIS — N390 Urinary tract infection, site not specified: Secondary | ICD-10-CM

## 2010-08-03 MED ORDER — AMPICILLIN 500 MG PO CAPS: 500.0000 mg | ORAL_CAPSULE | Freq: Four times a day (QID) | ORAL | Status: AC

## 2010-08-03 NOTE — Telephone Encounter (Signed)
Left patient a message about her urine culture and the need to start ampicillin

## 2010-08-04 LAB — CULTURE, URINE COMPREHENSIVE: Colony Count: >=100000

## 2010-08-31 ENCOUNTER — Ambulatory Visit: Payer: BC Managed Care – PPO | Admitting: Internal Medicine

## 2010-09-06 ENCOUNTER — Ambulatory Visit: Payer: BC Managed Care – PPO | Admitting: Internal Medicine

## 2010-09-06 ENCOUNTER — Ambulatory Visit (AMBULATORY_SURGERY_CENTER): Payer: BC Managed Care – PPO

## 2010-09-06 VITALS — Ht 64.0 in | Wt 174.6 lb

## 2010-09-06 DIAGNOSIS — Z800 Family history of malignant neoplasm of digestive organs: Secondary | ICD-10-CM

## 2010-09-06 MED ORDER — PEG-KCL-NACL-NASULF-NA ASC-C 100 G PO SOLR: 1.0000 | Freq: Once | ORAL | Status: AC

## 2010-09-07 ENCOUNTER — Telehealth: Payer: Self-pay | Admitting: *Deleted

## 2010-09-07 DIAGNOSIS — I739 Peripheral vascular disease, unspecified: Secondary | ICD-10-CM

## 2010-09-07 NOTE — Telephone Encounter (Signed)
New order needed for arterial doppler - pt is scheduled PER [facility]'s for Friday 5/11

## 2010-09-09 ENCOUNTER — Other Ambulatory Visit: Payer: Self-pay | Admitting: *Deleted

## 2010-09-09 ENCOUNTER — Other Ambulatory Visit: Payer: Self-pay | Admitting: Internal Medicine

## 2010-09-09 ENCOUNTER — Encounter (INDEPENDENT_AMBULATORY_CARE_PROVIDER_SITE_OTHER): Payer: BC Managed Care – PPO | Admitting: *Deleted

## 2010-09-09 DIAGNOSIS — I739 Peripheral vascular disease, unspecified: Secondary | ICD-10-CM

## 2010-09-09 DIAGNOSIS — I70219 Atherosclerosis of native arteries of extremities with intermittent claudication, unspecified extremity: Secondary | ICD-10-CM

## 2010-09-12 ENCOUNTER — Encounter: Payer: Self-pay | Admitting: Internal Medicine

## 2010-09-15 ENCOUNTER — Telehealth: Payer: Self-pay | Admitting: *Deleted

## 2010-09-15 NOTE — Telephone Encounter (Signed)
Pt left vm req results of Doppler. She said she "knows it's not great" and wants results and MD's directions. Please advise, thanks

## 2010-09-15 NOTE — Telephone Encounter (Signed)
Pt not at home - wks 2nd shift

## 2010-09-15 NOTE — Telephone Encounter (Signed)
Media tab - dated 09/09/10

## 2010-09-15 NOTE — Telephone Encounter (Signed)
No major abnormalities or risk of injury but several subtlties best discussed in person

## 2010-09-15 NOTE — Telephone Encounter (Signed)
nbot finding the doppler report- pelase help

## 2010-09-16 NOTE — Op Note (Signed)
Theresa Harrington, Theresa Harrington                  ACCOUNT NO.:  [id number]   MEDICAL RECORD NO.:  [id number]          PATIENT TYPE:  OIB   LOCATION:  1609                         FACILITY:  [facility]   PHYSICIAN:  Sandria Bales. Ezzard Standing, M.D.  DATE OF BIRTH:  06-Sep-1955   DATE OF PROCEDURE:  05/29/2005  DATE OF DISCHARGE:                                 OPERATIVE REPORT   PREOPERATIVE DIAGNOSIS:  Morbid obesity with a weight of 245, BMI 40.8.   POSTOPERATIVE DIAGNOSIS:  Morbid obesity with a weight of 245, BMI 40.8.   PROCEDURE:  Laparoscopic band with a 10 cm band.   SURGEON:  Sandria Bales. Ezzard Standing, M.D.   FIRST ASSISTANT:  Thornton Park. Daphine Deutscher, MD   ANESTHESIA:  General endotracheal.   ESTIMATED BLOOD LOSS:  Minimal.   INDICATIONS FOR PROCEDURE:  Ms. Hark is a 55 year old female who has  undergone a preoperative bariatric program for morbid obesity, which  includes labs, psych, and nutritional evaluation.  She understands the  indications, potential risks of a lap band procedure, which include but are  not limited to, bleeding, infection, bowel perforation, slippage, erosion,  and long term nutritional consequences.   OPERATIVE NOTE:  The patient was placed in a supine position, given 1 gm of  Cefoxitin at the initiation of the procedure.  Had PAS stockings in place.  Her abdomen was prepped with Betadine solution and sterilely draped.   I accessed the abdominal cavity through a left upper quadrant 12 mm Optiview  of Ethicon, and it put [place] in the abdominal cavity without difficulty.  Insufflated the abdomen with CO2.  I then placed four additional trocars, a  5 mm subxiphoid, a 12 mm right subcostal, a 10 mm right paramedian, a 10 mm  left paramedian, and a 5 mm lateral left subcostal trocar.   Abdominal exploration revealed the right and left lobes of the liver  unremarkable.  Stomach unremarkable.  The bowel was covered with omentum,  and no mass or lesion noted.   I then turned my attention to the  stomach.  I made an incision on the angle  of His to open along the left crus of the diaphragm.  I then went along the  right crus, opening to the medial to the caudate lobe and passed the  finger retractor around behind the proximal stomach/esophagus to get the  band.  It passed without resistance.  The band was then introduced through  the 12 mm port into the abdominal cavity.  I placed a 10 cm Lap band.  It  took three captures to pull the band around, but it pulled around fine.  Then Dr. Daphine Deutscher had to break scrub to go pass the lap band sizer tube down  into the stomach, but as soon as the sizer tube was in place, the band was  locked.  It was not too tight, and the sizer tube was then removed.  I then  placed three sutures of the stomach below the band, to the stomach above the  band.  I used a 2-0 Ethibond suture with tie  knots to hold the suture in  place.   This then made a wrap around the band.  The band moved easily.  There was no  impingement on the buckle.  These are felt to be a good place for the band.   I then removed the tubing for the reservoir out through the right paramedian  incision.  I removed the Nathanson retractor under direct visualization.  I  removed the other trocars under visualization, and there is no bleeding at  the trocar site.  I then expanded this right paramedian incision.  I  attached the tubing to the reservoir, and I sewed reservoir in place with a  2-0 Prolene suture in four quadrants to tack to the fascia.  The reservoir  lay flat, but was somewhat lateral to the incision.   I then irrigated the wound.  I closed each wound with a 5-0 Monocryl suture,  painted each wound with tincture of Benzoin and steri-stripped it.   The patient tolerated the procedure well.  Was kept overnight for  observation with plans to do her swallow tomorrow.      Sandria Bales. Ezzard Standing, M.D.  Electronically Signed     DHN/MEDQ  D:  05/29/2005  T:  05/29/2005  Job:   [id number]   cc:   Donnel Saxon  Fax: [phone]   Leatha Gilding. Mezer, M.D.  Fax: [phone]
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
## 2010-09-16 NOTE — Telephone Encounter (Signed)
Left mess to call office back.   
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
## 2010-09-16 NOTE — Telephone Encounter (Signed)
Patient informed, transferred to scheduler for ov.

## 2010-09-19 ENCOUNTER — Other Ambulatory Visit: Payer: BC Managed Care – PPO | Admitting: Gastroenterology

## 2010-09-27 ENCOUNTER — Encounter: Payer: Self-pay | Admitting: Internal Medicine

## 2010-09-28 ENCOUNTER — Encounter: Payer: Self-pay | Admitting: Internal Medicine

## 2010-09-28 ENCOUNTER — Ambulatory Visit (INDEPENDENT_AMBULATORY_CARE_PROVIDER_SITE_OTHER): Payer: BC Managed Care – PPO | Admitting: Internal Medicine

## 2010-09-28 VITALS — BP 120/70 | HR 58 | Temp 98.6°F | Resp 16 | Wt 168.0 lb

## 2010-09-28 DIAGNOSIS — E0590 Thyrotoxicosis, unspecified without thyrotoxic crisis or storm: Secondary | ICD-10-CM

## 2010-09-28 DIAGNOSIS — I10 Essential (primary) hypertension: Secondary | ICD-10-CM

## 2010-09-28 DIAGNOSIS — N390 Urinary tract infection, site not specified: Secondary | ICD-10-CM | POA: Insufficient documentation

## 2010-09-28 DIAGNOSIS — E119 Type 2 diabetes mellitus without complications: Secondary | ICD-10-CM

## 2010-09-28 DIAGNOSIS — I739 Peripheral vascular disease, unspecified: Secondary | ICD-10-CM | POA: Insufficient documentation

## 2010-09-28 MED ORDER — LINAGLIPTIN 5 MG PO TABS: 5.0000 mg | ORAL_TABLET | Freq: Every day | ORAL | Status: DC

## 2010-09-28 NOTE — Assessment & Plan Note (Signed)
She is referred to vasc surgery to discuss treatment options, she will start taking ASA 81 mg per day

## 2010-09-28 NOTE — Assessment & Plan Note (Signed)
Her last A1C was up to 8 so will add tradjenta to her current regimen for better control of her blood sugar

## 2010-09-28 NOTE — Patient Instructions (Signed)
Diabetes, Type 2 Diabetes is a lasting (chronic) disease. In type 2 diabetes, the pancreas does not make enough insulin (a hormone), and the body does not respond normally to the insulin that is made. This type of diabetes was also previously called adult onset diabetes. About 90% of all those who have diabetes have type 2. It usually occurs after the age of 23 but can occur at any age. CAUSES Unlike type 1 diabetes, which happens because insulin is no longer being made, type 2 diabetes happens because the body is making less insulin and has trouble using the insulin properly. SYMPTOMS  Drinking more than usual.   Urinating more than usual.   Blurred vision.   Dry, itchy skin.   Frequent infection like yeast infections in women.   More tired than usual (fatigue).  TREATMENT  Healthy eating.   Exercise.   Medication, if needed.   Monitoring blood glucose (sugar).   Seeing your caregiver regularly.  HOME CARE INSTRUCTIONS  Check your blood glucose (sugar) at least once daily. More frequent monitoring may be necessary, depending on your medications and on how well your diabetes is controlled. Your caregiver will advise you.   Take your medicine as directed by your caregiver.   Do not smoke.   Make wise food choices. Ask your caregiver for information. Weight loss can improve your diabetes.   Learn about low blood glucose (hypoglycemia) and how to treat it.   Get your eyes checked regularly.   Have a yearly physical exam. Have your blood pressure checked. Get your blood and urine tested.   Wear a pendant or bracelet saying that you have diabetes.   Check your feet every night for sores. Let your caregiver know if you have sores that are not healing.  SEEK MEDICAL CARE IF:  You are having problems keeping your blood glucose at target range.   You feel you might be having problems with your medicines.   You have symptoms of an illness that is not improving after 24  hours.   You have a sore or wound that is not healing.   You notice a change in vision or a new problem with your vision.  You develop a fever of more than 100.5Peripheral Vascular Disease (PVD) Peripheral Vascular Disease (PVD), also called Peripheral Arterial Disease (PAD), is a circulation problem caused by cholesterol (atherosclerotic plaque) deposits in the arteries. PVD commonly occurs in the lower extremities (legs) but it can occur in other areas of the body, such as your arms. The cholesterol buildup in the arteries reduces blood flow which can cause pain and other serious problems. The presence of PVD can place a person at risk for Coronary Artery Disease (CAD).  CAUSES Causes of PVD can be many. It is usually associated with more than one risk factor such as:  High Cholesterol.  Smoking.  Diabetes.  Lack of exercise or inactivity.  High blood pressure (Hypertension).  Obesity.  Family history.  SYMPTOMS When the lower extremities are affected, patients with PVD may experience:  Leg pain with exertion or physical activity. This is called INTERMITTENT CLAUDICATION. This may present as cramping or numbness with physical activity. The location of the pain is associated with the level of blockage. For example, blockage at the abdominal level (distal abdominal aorta) may result in buttock or hip pain. Lower leg arterial blockage may result in calf pain.  As PVD becomes more severe, pain can develop with less physical activity.  In people with  severe PVD, leg pain may occur at rest.  Other PVD signs and symptoms:  Leg numbness or weakness.  Coldness in the affected leg or foot, especially when compared to the other leg.  A change in leg color.  Patients with significant PVD are more prone to ulcers or sores on toes, feet or legs. These may take longer to heal or may reoccur. The ulcers or sores can become infected.  If signs and symptoms of PVD are ignored, gangrene may occur. This can  result in the loss of toes or loss of an entire limb.  Not all leg pain is related to PVD. Other medical conditions can cause leg pain such as:  Blood clots (embolism) or Deep Vein Thrombosis.  Inflammation of the blood vessels (vasculitis).  Spinal stenosis.  DIAGNOSIS Diagnosis of PVD can involve several different types of tests. These can include: Pulse Volume Recording Method (PVR). This test is simple, painless and does not involve the use of X-rays. PVR involves measuring and comparing the blood pressure in the arms and legs. An ABI (Ankle-Brachial Index) is calculated. The normal ratio of blood pressures is 1. As this number becomes smaller, it indicates more severe disease.  < 0.95 - indicates significant narrowing in one or more leg vessels.  <0.8 - there will usually be pain in the foot, leg or buttock with exercise.  <0.4 - will usually have pain in the legs at rest.  <0.25 - usually indicates limb threatening PVD.  Doppler detection of pulses in the legs. This test is painless and checks to see if you have a pulses in your legs/feet.  A dye or contrast material (a substance that highlights the blood vessels so they show up on x-ray) may be given to help your caregiver better see the arteries for the following tests. The dye is eliminated from your body by the kidney's. Your caregiver may order blood work to check your kidney function and other laboratory values before the following tests are performed:  Magnetic Resonance Angiography (MRA). An MRA is a picture study of the blood vessels and arteries. The MRA machine uses a large magnet to produce images of the blood vessels.  Computed Tomography Angiography (CTA). A CTA is a specialized x-ray that looks at how the blood flows in your blood vessels. An IV may be inserted into your arm so contrast dye can be injected.  Angiogram. Is a procedure that uses x-rays to look at your blood vessels. This procedure is minimally invasive, meaning a  small incision (cut) is made in your groin. A small tube (catheter) is then inserted into the artery of your groin. The catheter is guided to the blood vessel or artery your caregiver wants to examine. Contrast dye is injected into the catheter. X-rays are then taken of the blood vessel or artery. After the images are obtained, the catheter is taken out.  TREATMENT Treatment of PVD involves many interventions which may include: Lifestyle changes:  Quitting smoking.  Exercise.  Following a low fat, low cholesterol diet.  Control of diabetes.  Foot care is very important to the PVD patient. Good foot care can help prevent infection.  Medication:  Cholesterol-lowering medicine.  Blood pressure medicine.  Anti-platelet drugs.  Certain medicines may reduce symptoms of Intermittent Claudication.  Interventional/Surgical options:  Angioplasty. An Angioplasty is a procedure that inflates a balloon in the blocked artery. This opens the blocked artery to improve blood flow.  Stent Implant. A wire mesh tube (stent) is  placed in the artery. The stent expands and stays in place, allowing the artery to remain open.  Peripheral Bypass Surgery. This is a surgical procedure that reroutes the blood around a blocked artery to help improve blood flow. This type of procedure may be performed if Angioplasty or stent implants are not an option.  SEEK IMMEDIATE MEDICAL CARE IF: You develop pain or numbness in your arms or legs. Seek immediate medical help if your arm or leg turns cold, becomes blue in color and/or becomes numb.  You develop redness, warmth, swelling and pain in your arms or legs.  MAKE SURE YOU:  Understand these instructions.  Will watch your condition.  Will get help right away if you are not doing well or get worse.  Document Released: 05/25/2004 Document Re-Released: 07/12/2009  [facility] Patient Information 2011 [place], [place]..  Document Released: 04/17/2005 Document Re-Released:  05/09/2009 [facility] Patient Information 2011 [place], [place].

## 2010-09-28 NOTE — Progress Notes (Signed)
Subjective:    Patient ID: Theresa Harrington, female    DOB: [birth year], 55 y.o.   MRN: [id number]  Diabetes She presents for her follow-up diabetic visit. She has type 2 diabetes mellitus. Her disease course has been fluctuating. Pertinent negatives for hypoglycemia include no pallor. Pertinent negatives for diabetes include no blurred vision, no chest pain, no fatigue, no foot paresthesias, no foot ulcerations, no polydipsia, no polyphagia, no polyuria, no visual change, no weakness and no weight loss. There are no hypoglycemic complications. Symptoms are stable. Diabetic complications include PVD. Current diabetic treatment includes oral agent (dual therapy). She is compliant with treatment all of the time. Her weight is stable. She is following a generally healthy diet. Meal planning includes avoidance of concentrated sweets. She has not had a previous visit with a dietician. She participates in exercise intermittently. Her home blood glucose trend is increasing steadily. Her breakfast blood glucose range is generally 90-110 mg/dl. Her lunch blood glucose range is generally 130-140 mg/dl. Her dinner blood glucose range is generally 130-140 mg/dl. Her highest blood glucose is 130-140 mg/dl. Her overall blood glucose range is 130-140 mg/dl. An ACE inhibitor/angiotensin II receptor blocker is being taken. She does not see a podiatrist.Eye exam is current.      Review of Systems  Constitutional: Negative for fever, chills, weight loss, diaphoresis, activity change, appetite change, fatigue and unexpected weight change.  HENT: Negative for facial swelling, neck pain and neck stiffness.   Eyes: Negative for blurred vision and visual disturbance.  Respiratory: Negative for apnea, cough, shortness of breath, wheezing and stridor.   Cardiovascular: Negative for chest pain, palpitations and leg swelling.  Gastrointestinal: Negative for nausea, vomiting, abdominal pain, diarrhea, constipation, blood in stool and  abdominal distention.  Genitourinary: Negative for dysuria, urgency, polyuria, frequency, hematuria, flank pain, enuresis, difficulty urinating and dyspareunia.  Musculoskeletal: Positive for arthralgias (she has calf pain when she walks or exercises). Negative for myalgias, back pain, joint swelling and gait problem.  Skin: Negative for color change, pallor and rash.  Neurological: Negative for weakness.  Hematological: Negative for polydipsia, polyphagia and adenopathy. Does not bruise/bleed easily.  Psychiatric/Behavioral: Negative.        Objective:   Physical Exam  Constitutional: She is oriented to person, place, and time. She appears well-developed and well-nourished. No distress.  HENT:  Head: Normocephalic and atraumatic.  Right Ear: External ear normal.  Left Ear: External ear normal.  Nose: Nose normal.  Mouth/Throat: Oropharynx is clear and moist. No oropharyngeal exudate.  Eyes: Conjunctivae and EOM are normal. Pupils are equal, round, and reactive to light. Right eye exhibits no discharge. Left eye exhibits no discharge. No scleral icterus.  Neck: Normal range of motion. Neck supple. No JVD present. No tracheal deviation present. No thyromegaly present.  Cardiovascular: Normal rate, regular rhythm, normal heart sounds and intact distal pulses.  Exam reveals no gallop and no friction rub.   No murmur heard. Pulmonary/Chest: Effort normal and breath sounds normal. No stridor. No respiratory distress. She has no wheezes. She has no rales. She exhibits no tenderness.  Abdominal: Soft. Bowel sounds are normal. She exhibits no distension. There is no tenderness. There is no rebound and no guarding.  Musculoskeletal: Normal range of motion. She exhibits no edema and no tenderness.  Lymphadenopathy:    She has no cervical adenopathy.  Neurological: She is alert and oriented to person, place, and time. She has normal reflexes. She displays normal reflexes. No cranial nerve deficit.  She exhibits normal  muscle tone. Coordination normal.  Skin: Skin is warm and dry. No rash noted. She is not diaphoretic. No erythema. No pallor.  Psychiatric: She has a normal mood and affect. Her behavior is normal. Judgment and thought content normal.        Lab Results  Component Value Date   WBC 8.1 08/01/2010   HGB 12.4 08/01/2010   HCT 36.5 08/01/2010   PLT 251.0 08/01/2010   CHOL 158 08/01/2010   TRIG 129.0 08/01/2010   HDL 55.30 08/01/2010   LDLDIRECT 160.2 03/01/2010   ALT 15 08/01/2010   AST 17 08/01/2010   NA 138 08/01/2010   K 4.1 08/01/2010   CL 103 08/01/2010   CREATININE 0.5 08/01/2010   BUN 16 08/01/2010   CO2 27 08/01/2010   TSH 0.14* 08/01/2010   HGBA1C 8.0* 08/01/2010   MICROALBUR 3.5* 03/01/2010    Assessment & Plan:

## 2010-09-28 NOTE — Assessment & Plan Note (Signed)
Her BP is well controlled 

## 2010-09-28 NOTE — Assessment & Plan Note (Signed)
She is referred to Urology to look for secondary causes of UTI

## 2010-09-28 NOTE — Assessment & Plan Note (Signed)
This appears stable 

## 2010-10-25 ENCOUNTER — Telehealth: Payer: Self-pay | Admitting: *Deleted

## 2010-10-25 NOTE — Telephone Encounter (Signed)
Losartan 100mg  qd #30, refill prn

## 2010-10-25 NOTE — Telephone Encounter (Signed)
Patient can not afford diovan and req alt Rx. Samples are not provided to office anymore - Can you suggest alternative in Dr Yetta Barre absence? (previous history has pt on lisinopril/hctz and benicar.  Benicar req PA and this is reason pt was changed to diovan)

## 2010-10-26 MED ORDER — LOSARTAN POTASSIUM 100 MG PO TABS: 100.0000 mg | ORAL_TABLET | Freq: Every day | ORAL | Status: DC

## 2010-10-26 NOTE — Telephone Encounter (Signed)
RX sent in and pt notified to check with pharmacy/[facility]

## 2010-10-27 ENCOUNTER — Other Ambulatory Visit: Payer: BC Managed Care – PPO | Admitting: Gastroenterology

## 2010-10-31 ENCOUNTER — Encounter: Payer: BC Managed Care – PPO | Admitting: Surgery

## 2010-10-31 ENCOUNTER — Other Ambulatory Visit: Payer: BC Managed Care – PPO | Admitting: Gastroenterology

## 2010-11-01 ENCOUNTER — Telehealth: Payer: Self-pay | Admitting: *Deleted

## 2010-11-01 NOTE — Telephone Encounter (Signed)
Pt dropped of note regarding doppler, ? Report needs to be sent somewhere? - need to call pt to clarify.

## 2010-11-03 NOTE — Telephone Encounter (Signed)
Called spoke with patient who request doppler be faxed to [facility]. Request faxed and pt notified

## 2010-11-14 ENCOUNTER — Other Ambulatory Visit: Payer: BC Managed Care – PPO | Admitting: Gastroenterology

## 2010-11-21 ENCOUNTER — Encounter: Payer: BC Managed Care – PPO | Admitting: Surgery

## 2010-12-01 ENCOUNTER — Encounter: Payer: Self-pay | Admitting: Internal Medicine

## 2010-12-01 ENCOUNTER — Other Ambulatory Visit (INDEPENDENT_AMBULATORY_CARE_PROVIDER_SITE_OTHER): Payer: BC Managed Care – PPO

## 2010-12-01 ENCOUNTER — Ambulatory Visit (INDEPENDENT_AMBULATORY_CARE_PROVIDER_SITE_OTHER): Payer: BC Managed Care – PPO | Admitting: Internal Medicine

## 2010-12-01 DIAGNOSIS — D649 Anemia, unspecified: Secondary | ICD-10-CM

## 2010-12-01 DIAGNOSIS — I10 Essential (primary) hypertension: Secondary | ICD-10-CM

## 2010-12-01 DIAGNOSIS — E785 Hyperlipidemia, unspecified: Secondary | ICD-10-CM

## 2010-12-01 DIAGNOSIS — E119 Type 2 diabetes mellitus without complications: Secondary | ICD-10-CM

## 2010-12-01 DIAGNOSIS — N390 Urinary tract infection, site not specified: Secondary | ICD-10-CM

## 2010-12-01 DIAGNOSIS — E0590 Thyrotoxicosis, unspecified without thyrotoxic crisis or storm: Secondary | ICD-10-CM

## 2010-12-01 LAB — CBC WITH DIFFERENTIAL/PLATELET
Basophils Absolute: 0.1 10*3/uL (ref 0.0–0.1)
Basophils Relative: 0.6 % (ref 0.0–3.0)
Hemoglobin: 12.2 g/dL (ref 12.0–15.0)
Lymphocytes Relative: 30.2 % (ref 12.0–46.0)
Monocytes Relative: 6.1 % (ref 3.0–12.0)
Neutro Abs: 5.5 10*3/uL (ref 1.4–7.7)
RBC: 4.32 Mil/uL (ref 3.87–5.11)

## 2010-12-01 LAB — COMPREHENSIVE METABOLIC PANEL
ALT: 19 U/L (ref 0–35)
Albumin: 4.2 g/dL (ref 3.5–5.2)
CO2: 28 mEq/L (ref 19–32)
Calcium: 9.5 mg/dL (ref 8.4–10.5)
Chloride: 106 mEq/L (ref 96–112)
GFR: 104.75 mL/min (ref >60.00)
Sodium: 142 mEq/L (ref 135–145)
Total Protein: 6.9 g/dL (ref 6.0–8.3)

## 2010-12-01 LAB — URINALYSIS, ROUTINE W REFLEX MICROSCOPIC
Ketones, ur: NEGATIVE
Urine Glucose: NEGATIVE
Urobilinogen, UA: 0.2 (ref 0.0–1.0)

## 2010-12-01 LAB — IBC PANEL: Iron: 38 ug/dL — ABNORMAL LOW (ref 42–145)

## 2010-12-01 NOTE — Assessment & Plan Note (Signed)
Recheck her urine culture today

## 2010-12-01 NOTE — Progress Notes (Signed)
Subjective:    Patient ID: Theresa Harrington, female    DOB: 11/30/1955, 55 y.o.   MRN: [id number]  Diabetes She presents for her follow-up diabetic visit. She has type 2 diabetes mellitus. Her disease course has been stable. There are no hypoglycemic associated symptoms. Pertinent negatives for hypoglycemia include no dizziness, headaches, pallor, seizures, speech difficulty, sweats or tremors. There are no diabetic associated symptoms. Pertinent negatives for diabetes include no blurred vision, no chest pain, no fatigue and no weakness. There are no hypoglycemic complications. Symptoms are stable. Current diabetic treatment includes oral agent (triple therapy). She is compliant with treatment all of the time. Her weight is stable. She is following a generally healthy diet. Meal planning includes avoidance of concentrated sweets. She has not had a previous visit with a dietician. She participates in exercise intermittently. There is no change in her home blood glucose trend. Her breakfast blood glucose range is generally 110-130 mg/dl. Her lunch blood glucose range is generally 130-140 mg/dl. Her dinner blood glucose range is generally 140-180 mg/dl. Her highest blood glucose is >200 mg/dl. Her overall blood glucose range is 130-140 mg/dl. An ACE inhibitor/angiotensin II receptor blocker is being taken. She does not see a podiatrist.Eye exam is current.  Hypertension This is a chronic problem. The current episode started more than 1 year ago. The problem has been gradually improving since onset. The problem is controlled. Pertinent negatives include no anxiety, blurred vision, chest pain, headaches, malaise/fatigue, neck pain, orthopnea, palpitations, peripheral edema, PND, shortness of breath or sweats. Past treatments include angiotensin blockers. The current treatment provides significant improvement. Compliance problems include exercise and diet.       Review of Systems  Constitutional: Negative for  fever, chills, malaise/fatigue, diaphoresis, activity change, appetite change, fatigue and unexpected weight change.  HENT: Negative for sore throat, facial swelling, trouble swallowing, neck pain, neck stiffness and voice change.   Eyes: Negative for blurred vision, photophobia, redness and visual disturbance.  Respiratory: Negative for apnea, cough, choking, chest tightness, shortness of breath, wheezing and stridor.   Cardiovascular: Negative for chest pain, palpitations, orthopnea, leg swelling and PND.  Gastrointestinal: Negative for nausea, vomiting, abdominal pain, diarrhea and constipation.  Genitourinary: Negative for dysuria, urgency, frequency, hematuria, flank pain, decreased urine volume, vaginal bleeding, vaginal discharge, enuresis, difficulty urinating, genital sores, vaginal pain, menstrual problem, pelvic pain and dyspareunia.  Musculoskeletal: Negative for myalgias, back pain, joint swelling, arthralgias and gait problem.  Skin: Negative for color change, pallor, rash and wound.  Neurological: Negative for dizziness, tremors, seizures, syncope, facial asymmetry, speech difficulty, weakness, light-headedness, numbness and headaches.  Hematological: Negative for adenopathy. Does not bruise/bleed easily.  Psychiatric/Behavioral: Negative.        Objective:   Physical Exam  Vitals reviewed. Constitutional: She is oriented to person, place, and time. She appears well-developed and well-nourished. No distress.  HENT:  Head: Normocephalic and atraumatic.  Right Ear: External ear normal.  Left Ear: External ear normal.  Nose: Nose normal.  Mouth/Throat: Oropharynx is clear and moist. No oropharyngeal exudate.  Eyes: Conjunctivae and EOM are normal. Pupils are equal, round, and reactive to light. Right eye exhibits no discharge. Left eye exhibits no discharge. No scleral icterus.  Neck: Normal range of motion. Neck supple. No JVD present. No tracheal deviation present. No  thyromegaly present.  Cardiovascular: Normal rate, regular rhythm, normal heart sounds and intact distal pulses.  Exam reveals no gallop and no friction rub.   No murmur heard. Pulmonary/Chest: Effort normal and breath  sounds normal. No stridor. No respiratory distress. She has no wheezes. She has no rales. She exhibits no tenderness.  Abdominal: Soft. Bowel sounds are normal. She exhibits no distension and no mass. There is no tenderness. There is no rebound and no guarding.  Musculoskeletal: Normal range of motion. She exhibits no edema and no tenderness.  Lymphadenopathy:    She has no cervical adenopathy.  Neurological: She is alert and oriented to person, place, and time. She has normal reflexes. She displays normal reflexes. No cranial nerve deficit. She exhibits normal muscle tone. Coordination normal.  Skin: Skin is warm and dry. No rash noted. She is not diaphoretic. No erythema. No pallor.  Psychiatric: She has a normal mood and affect. Her behavior is normal. Judgment and thought content normal.      Lab Results  Component Value Date   WBC 8.1 08/01/2010   HGB 12.4 08/01/2010   HCT 36.5 08/01/2010   PLT 251.0 08/01/2010   CHOL 158 08/01/2010   TRIG 129.0 08/01/2010   HDL 55.30 08/01/2010   LDLDIRECT 160.2 03/01/2010   ALT 15 08/01/2010   AST 17 08/01/2010   NA 138 08/01/2010   K 4.1 08/01/2010   CL 103 08/01/2010   CREATININE 0.5 08/01/2010   BUN 16 08/01/2010   CO2 27 08/01/2010   TSH 0.14* 08/01/2010   HGBA1C 8.0* 08/01/2010   MICROALBUR 3.5* 03/01/2010      Assessment & Plan:

## 2010-12-01 NOTE — Assessment & Plan Note (Signed)
Recheck TSH today.  

## 2010-12-01 NOTE — Assessment & Plan Note (Signed)
Check CBC and iron studies today 

## 2010-12-01 NOTE — Assessment & Plan Note (Signed)
She is doing well on vytorin 

## 2010-12-01 NOTE — Assessment & Plan Note (Signed)
Her BP is well controlled 

## 2010-12-01 NOTE — Patient Instructions (Signed)
Diabetes, Type 2 Diabetes is a lasting (chronic) disease. In type 2 diabetes, the pancreas does not make enough insulin (a hormone), and the body does not respond normally to the insulin that is made. This type of diabetes was also previously called adult onset diabetes. About 90% of all those who have diabetes have type 2. It usually occurs after the age of 40 but can occur at any age. CAUSES Unlike type 1 diabetes, which happens because insulin is no longer being made, type 2 diabetes happens because the body is making less insulin and has trouble using the insulin properly. SYMPTOMS  Drinking more than usual.   Urinating more than usual.   Blurred vision.   Dry, itchy skin.   Frequent infection like yeast infections in women.   More tired than usual (fatigue).  TREATMENT  Healthy eating.   Exercise.   Medication, if needed.   Monitoring blood glucose (sugar).   Seeing your caregiver regularly.  HOME CARE INSTRUCTIONS  Check your blood glucose (sugar) at least once daily. More frequent monitoring may be necessary, depending on your medications and on how well your diabetes is controlled. Your caregiver will advise you.   Take your medicine as directed by your caregiver.   Do not smoke.   Make wise food choices. Ask your caregiver for information. Weight loss can improve your diabetes.   Learn about low blood glucose (hypoglycemia) and how to treat it.   Get your eyes checked regularly.   Have a yearly physical exam. Have your blood pressure checked. Get your blood and urine tested.   Wear a pendant or bracelet saying that you have diabetes.   Check your feet every night for sores. Let your caregiver know if you have sores that are not healing.  SEEK MEDICAL CARE IF:  You are having problems keeping your blood glucose at target range.   You feel you might be having problems with your medicines.   You have symptoms of an illness that is not improving after 24  hours.   You have a sore or wound that is not healing.   You notice a change in vision or a new problem with your vision.   You develop a fever of more than 100.5.  Document Released: 04/17/2005 Document Re-Released: 05/09/2009 [employer] Patient Information 2011 [employer]. 

## 2010-12-01 NOTE — Assessment & Plan Note (Signed)
I will recheck her A1C today and will monitor her renal function today

## 2010-12-05 ENCOUNTER — Encounter: Payer: Self-pay | Admitting: Internal Medicine

## 2010-12-06 ENCOUNTER — Telehealth: Payer: Self-pay

## 2010-12-06 DIAGNOSIS — N390 Urinary tract infection, site not specified: Secondary | ICD-10-CM

## 2010-12-06 LAB — CULTURE, URINE COMPREHENSIVE

## 2010-12-06 NOTE — Telephone Encounter (Signed)
Patient called [facility] requesting lab results. I advised on letter per MD. Patient would like to know is MD will be sending in rx to treat urine infection. Patient also wanted MD to know she is unhappy that lipid panel was not ordered.

## 2010-12-07 MED ORDER — AMPICILLIN 500 MG PO CAPS: 500.0000 mg | ORAL_CAPSULE | Freq: Four times a day (QID) | ORAL | Status: AC

## 2010-12-07 NOTE — Telephone Encounter (Signed)
done

## 2010-12-07 NOTE — Telephone Encounter (Signed)
Patient informed. 

## 2010-12-13 ENCOUNTER — Telehealth: Payer: Self-pay | Admitting: *Deleted

## 2010-12-13 DIAGNOSIS — Z1231 Encounter for screening mammogram for malignant neoplasm of breast: Secondary | ICD-10-CM

## 2010-12-13 NOTE — Telephone Encounter (Signed)
Patient requesting referral for mammogram @ [facility] [phone]

## 2010-12-14 NOTE — Telephone Encounter (Signed)
done

## 2010-12-30 ENCOUNTER — Ambulatory Visit (HOSPITAL_BASED_OUTPATIENT_CLINIC_OR_DEPARTMENT_OTHER)
Admission: RE | Admit: 2010-12-30 | Discharge: 2010-12-30 | Disposition: A | Discharge status: Home / Self Care | Payer: BC Managed Care – PPO | Source: Ambulatory Visit | Attending: Urology | Admitting: Urology

## 2010-12-30 DIAGNOSIS — Z01812 Encounter for preprocedural laboratory examination: Secondary | ICD-10-CM | POA: Insufficient documentation

## 2010-12-30 DIAGNOSIS — N201 Calculus of ureter: Secondary | ICD-10-CM | POA: Insufficient documentation

## 2010-12-30 DIAGNOSIS — Z87440 Personal history of urinary (tract) infections: Secondary | ICD-10-CM | POA: Insufficient documentation

## 2010-12-30 DIAGNOSIS — N1330 Unspecified hydronephrosis: Secondary | ICD-10-CM | POA: Insufficient documentation

## 2010-12-30 HISTORY — PX: OTHER SURGICAL HISTORY: SHX169

## 2010-12-30 LAB — POCT I-STAT 4, (NA,K, GLUC, HGB,HCT)
Hemoglobin: 12.2 g/dL (ref 12.0–15.0)
Potassium: 4.1 mEq/L (ref 3.5–5.1)

## 2010-12-30 LAB — GLUCOSE, CAPILLARY: Glucose-Capillary: 105 mg/dL — ABNORMAL HIGH (ref 70–99)

## 2011-01-06 ENCOUNTER — Encounter: Payer: Self-pay | Admitting: Surgery

## 2011-01-06 NOTE — Op Note (Signed)
  NAMEDONDREA, CLENDENIN                  ACCOUNT NO.:  [id number]  MEDICAL RECORD NO.:  [id number]  LOCATION:                                 FACILITY:  PHYSICIAN:  Danae Chen, M.D.  DATE OF BIRTH:  08-30-1955  DATE OF PROCEDURE:  12/30/2010 DATE OF DISCHARGE:                              OPERATIVE REPORT   PREOPERATIVE DIAGNOSES:  Right ureteral calculus and right hydronephrosis.  POSTOPERATIVE DIAGNOSES:  Right ureteral calculus and right hydronephrosis.  PROCEDURE DONE:  Cystoscopy, right retrograde pyelogram, and insertion of double-J stent.  SURGEON:  Danae Chen, MD  ANESTHESIA:  General.  INDICATION:  The patient is a 55 year old female who has a history of recurrent urinary tract infection.  Workup included a CT scan that showed moderately severe right hydronephrosis and a 16 mm stone at the junction of the proximal and mid ureter.  Treatment options were discussed with the patient, double-J stent plus ESL versus ureteroscopy. After discussion of the risks and benefits, she elected to have a double- J stent followed by ESL.  She is scheduled today for cystoscopy, right retrograde pyelogram, and double-J stent insertion.  The patient was identified by her wristband and proper time-out was taken.  Under general anesthesia, she was prepped and draped and placed in the dorsal lithotomy position.  A panendoscope was inserted in the bladder. The bladder mucosa was normal.  There was no stone or tumor in the bladder.  The ureteral orifices were in normal position and shape.  Retrograde pyelogram.  A cone tip catheter was passed through the cystoscope and through the right ureteral orifice.  Contrast was then injected through the cone tip catheter, however, the contrast would not travel up the ureter.  The cone tip catheter was then removed.  A sensor wire was passed through a #5-French open-ended catheter into the right ureteral orifice and Glidewire was advanced into the  mid ureter and the open-ended catheter was advanced over the Glidewire into the mid ureter. The Glidewire was removed.  10 cc of contrast was then injected through the open-ended catheter.  The distal and mid ureter appeared normal. There was a large filling defect at the level of the iliac crest and the ureter proximal to that filling defect was dilated.  The open-ended catheter was then advanced up to the proximal ureter and contrast was injected.  The renal pelvis and calyces were markedly dilated.  A sensor wire was then passed through the open-ended catheter and the open- ended catheter was removed.  Then, a #6-French - 26 double-J stent was passed over the sensor wire.  The proximal curl of the double-J stent was in the renal pelvis.  The distal curl was in the bladder.  The bladder was then emptied and cystoscope and sensor wire were removed.  The patient tolerated the procedure well and left the OR in satisfactory condition to post-anesthesia care unit.     Danae Chen, M.D.     MN/MEDQ  D:  12/30/2010  T:  12/30/2010  Job:  [id number]  Electronically Signed by Lindaann Slough M.D. on 01/06/2011 06:25:58 PM

## 2011-01-09 ENCOUNTER — Ambulatory Visit (INDEPENDENT_AMBULATORY_CARE_PROVIDER_SITE_OTHER): Payer: BC Managed Care – PPO | Admitting: Surgery

## 2011-01-09 ENCOUNTER — Encounter: Payer: Self-pay | Admitting: Surgery

## 2011-01-09 ENCOUNTER — Encounter (INDEPENDENT_AMBULATORY_CARE_PROVIDER_SITE_OTHER): Payer: BC Managed Care – PPO | Admitting: *Deleted

## 2011-01-09 ENCOUNTER — Ambulatory Visit (HOSPITAL_COMMUNITY): Admission: RE | Admit: 2011-01-09 | Payer: BC Managed Care – PPO | Source: Ambulatory Visit | Admitting: Urology

## 2011-01-09 VITALS — BP 152/81 | HR 64 | Resp 16 | Wt 163.0 lb

## 2011-01-09 DIAGNOSIS — I739 Peripheral vascular disease, unspecified: Secondary | ICD-10-CM

## 2011-01-09 NOTE — Progress Notes (Signed)
CC Leg pain with walking Referring Physician:Thomas Jones HPI: This is a 55 year old female I am seeing at the request of Dr. Yetta Barre for evaluation of claudication. The patient states that she has been having trouble walking for many years however over the past several months this has become more severe. Her symptoms are aggravated by walking up hills and long distances. They are alleviated with rest. She denies rest pain she denies nonhealing wounds.  The patient suffers from diabetes which is being medically managed. Her hemoglobin A1c has improved it is now 6.8. She also suffers from hypertension which is medically managed her most recent blood pressures have been in the 120s over 60s range. Her cholesterol has been well controlled on medication her HDL is 55 her LDL is 77.  Patient has a history of smoking but quit 8 years ago.  Review of systems:  Positive for pain in legs with walking all other review of systems are negative as documented in the encounter form..  Past Medical History  Diagnosis Date  . Anemia   . Hyperlipidemia   . Hypertension   . Hypothyroidism   . Diabetes mellitus     type II    History  Substance Use Topics  . Smoking status: Former Smoker -- 20 years    Quit date: 09/06/2002  . Smokeless tobacco: Never Used  . Alcohol Use: No    Family History  Problem Relation Age of Onset  . Arthritis Other   . Cancer Other     colon  . Hypertension Other   . Stroke Other   . Diabetes Father   . Heart disease Father   . Diabetes Sister   . Heart disease Sister   . Diabetes Brother   . Heart disease Brother   . Colon cancer Maternal Aunt   . Diabetes Brother   . Diabetes Brother   . Kidney disease Mother     Allergies  Allergen Reactions  . Codeine   . Lisinopril     REACTION: cough    Current outpatient prescriptions:ezetimibe-simvastatin (VYTORIN) 10-40 MG per tablet, Take 1 tablet by mouth daily.  , Disp: , Rfl: ;  glyBURIDE (DIABETA) 5 MG  tablet, TAKE ONE TABLET BY MOUTH EVERY DAY, Disp: 90 tablet, Rfl: 3;  linagliptin (TRADJENTA) 5 MG TABS tablet, Take 1 tablet (5 mg total) by mouth daily., Disp: 70 tablet, Rfl: 0;  metFORMIN (GLUCOPHAGE) 1000 MG tablet, TAKE ONE TABLET BY MOUTH TWICE DAILY, Disp: 180 tablet, Rfl: 3 valsartan (DIOVAN) 160 MG tablet, Take 160 mg by mouth daily.  , Disp: , Rfl: ;  ferrous sulfate (SLOW RELEASE IRON) 160 (50 FE) MG TBCR, Take 1 tablet by mouth daily.  , Disp: , Rfl: ;  losartan (COZAAR) 100 MG tablet, Take 1 tablet (100 mg total) by mouth daily., Disp: 30 tablet, Rfl: 11  BP 152/81  Pulse 64  Resp 16  Wt 163 lb (73.936 kg)  Physical exam: General: Well-appearing in no acute distress  HEENT: Normal  Cardiovascular: Regular rate and rhythm no murmur. No carotid bruits. Pulmonary: Respirations nonlabored no wheezes or rhonchi.  Abdomen: Soft nontender no hepatosplenomegaly. LAP-BAND site healed Musculoskeletal: No abnormalities Skin: No rash Neuro: Nonfocal exam no gross deficits  Diagnostic studies: ABIs today are 0.96 on the right 0.69 on the left  Assessment: Left leg claudication Plan: I discussed the importance of medical therapy at this time. We discussed making sure that her blood pressure cholesterol and blood sugars remain as well  controlled as possible. I also stressed the importance of continuation of smoking cessation. I detailed an exercise program for her to begin. I've also started her on cilostazol to see if this can help improve her walking distance. I will plan on seeing her back in 3 months to see how she has done with medication changes and the exercise regimen. Hopefully we can avoid any invasive intervention at this time. She is very apprehensive about an amputation.  she had a mother who lost both of her legs.

## 2011-01-16 ENCOUNTER — Ambulatory Visit (HOSPITAL_COMMUNITY)
Admission: RE | Admit: 2011-01-16 | Discharge: 2011-01-16 | Disposition: A | Discharge status: Home / Self Care | Payer: BC Managed Care – PPO | Source: Ambulatory Visit | Attending: Urology | Admitting: Urology

## 2011-01-16 DIAGNOSIS — E119 Type 2 diabetes mellitus without complications: Secondary | ICD-10-CM | POA: Insufficient documentation

## 2011-01-16 DIAGNOSIS — N201 Calculus of ureter: Secondary | ICD-10-CM | POA: Insufficient documentation

## 2011-01-16 DIAGNOSIS — N1330 Unspecified hydronephrosis: Secondary | ICD-10-CM | POA: Insufficient documentation

## 2011-01-16 DIAGNOSIS — I10 Essential (primary) hypertension: Secondary | ICD-10-CM | POA: Insufficient documentation

## 2011-02-26 ENCOUNTER — Other Ambulatory Visit: Payer: Self-pay | Admitting: Surgery

## 2011-02-26 DIAGNOSIS — I70219 Atherosclerosis of native arteries of extremities with intermittent claudication, unspecified extremity: Secondary | ICD-10-CM

## 2011-03-27 ENCOUNTER — Encounter: Payer: Self-pay | Admitting: Internal Medicine

## 2011-03-27 ENCOUNTER — Encounter (INDEPENDENT_AMBULATORY_CARE_PROVIDER_SITE_OTHER): Payer: BC Managed Care – PPO | Admitting: *Deleted

## 2011-03-27 ENCOUNTER — Ambulatory Visit (INDEPENDENT_AMBULATORY_CARE_PROVIDER_SITE_OTHER): Payer: BC Managed Care – PPO | Admitting: Internal Medicine

## 2011-03-27 DIAGNOSIS — N390 Urinary tract infection, site not specified: Secondary | ICD-10-CM

## 2011-03-27 DIAGNOSIS — E0590 Thyrotoxicosis, unspecified without thyrotoxic crisis or storm: Secondary | ICD-10-CM

## 2011-03-27 DIAGNOSIS — I10 Essential (primary) hypertension: Secondary | ICD-10-CM

## 2011-03-27 DIAGNOSIS — E785 Hyperlipidemia, unspecified: Secondary | ICD-10-CM

## 2011-03-27 DIAGNOSIS — F329 Major depressive disorder, single episode, unspecified: Secondary | ICD-10-CM

## 2011-03-27 DIAGNOSIS — D649 Anemia, unspecified: Secondary | ICD-10-CM

## 2011-03-27 DIAGNOSIS — E119 Type 2 diabetes mellitus without complications: Secondary | ICD-10-CM

## 2011-03-27 LAB — CBC WITH DIFFERENTIAL/PLATELET
Basophils Relative: 0.3 % (ref 0.0–3.0)
Eosinophils Absolute: 0 10*3/uL (ref 0.0–0.7)
MCHC: 33.4 g/dL (ref 30.0–36.0)
MCV: 85.9 fl (ref 78.0–100.0)
Monocytes Absolute: 0.4 10*3/uL (ref 0.1–1.0)
Neutrophils Relative %: 64 % (ref 43.0–77.0)
RBC: 4.34 Mil/uL (ref 3.87–5.11)

## 2011-03-27 LAB — URINALYSIS, ROUTINE W REFLEX MICROSCOPIC
Nitrite: NEGATIVE
Specific Gravity, Urine: >=1.03 (ref 1.000–1.030)
pH: 6 (ref 5.0–8.0)

## 2011-03-27 LAB — COMPREHENSIVE METABOLIC PANEL
Alkaline Phosphatase: 62 U/L (ref 39–117)
BUN: 13 mg/dL (ref 6–23)
Creatinine, Ser: 0.7 mg/dL (ref 0.4–1.2)
Glucose, Bld: 114 mg/dL — ABNORMAL HIGH (ref 70–99)
Sodium: 141 mEq/L (ref 135–145)
Total Bilirubin: 0.6 mg/dL (ref 0.3–1.2)

## 2011-03-27 LAB — LIPID PANEL
Cholesterol: 131 mg/dL (ref 0–200)
HDL: 58.4 mg/dL (ref >39.00)
VLDL: 17 mg/dL (ref 0.0–40.0)

## 2011-03-27 LAB — TSH: TSH: 0.08 u[IU]/mL — ABNORMAL LOW (ref 0.35–5.50)

## 2011-03-27 MED ORDER — DESVENLAFAXINE SUCCINATE ER 50 MG PO TB24: 50.0000 mg | ORAL_TABLET | Freq: Every day | ORAL | Status: DC

## 2011-03-27 MED ORDER — EZETIMIBE-SIMVASTATIN 10-40 MG PO TABS: 1.0000 | ORAL_TABLET | Freq: Every day | ORAL | Status: DC

## 2011-03-27 NOTE — Assessment & Plan Note (Signed)
It sounds like she is euthyroid, I will check her TSH today

## 2011-03-27 NOTE — Assessment & Plan Note (Signed)
I will check her UA and urine clx to see if she has any evidence of UTI

## 2011-03-27 NOTE — Progress Notes (Addendum)
Subjective:    Patient ID: Theresa Harrington, female    DOB: 03-24-1956, 55 y.o.   MRN: [id number]  Diabetes She presents for her follow-up diabetic visit. She has type 2 diabetes mellitus. Her disease course has been fluctuating. Hypoglycemia symptoms include sweats and tremors (nocturnal with BS down to 25-50). Pertinent negatives for hypoglycemia include no confusion, dizziness, headaches, hunger, mood changes, nervousness/anxiousness, pallor, seizures, sleepiness or speech difficulty. Pertinent negatives for diabetes include no blurred vision, no chest pain, no fatigue, no foot paresthesias, no foot ulcerations, no polydipsia, no polyphagia, no polyuria, no visual change, no weakness and no weight loss. Hypoglycemia complications include nocturnal hypoglycemia. Pertinent negatives for hypoglycemia complications include no blackouts, no hospitalization, no required assistance and no required glucagon injection. Symptoms are worsening. Diabetic complications include PVD. Current diabetic treatment includes oral agent (triple therapy). She is compliant with treatment all of the time. Her weight is stable. She is following a generally healthy diet. Meal planning includes avoidance of concentrated sweets. She participates in exercise intermittently. Her home blood glucose trend is decreasing steadily. Her breakfast blood glucose range is generally 70-90 mg/dl. Her lunch blood glucose range is generally 70-90 mg/dl. Her dinner blood glucose range is generally 70-90 mg/dl. Her highest blood glucose is 70-90 mg/dl. Her overall blood glucose range is 70-90 mg/dl. An ACE inhibitor/angiotensin II receptor blocker is being taken. She does not see a podiatrist.Eye exam is current.      Review of Systems  Constitutional: Negative for fever, chills, weight loss, diaphoresis, activity change, appetite change, fatigue and unexpected weight change.  HENT: Negative.   Eyes: Negative.  Negative for blurred vision.    Respiratory: Negative for apnea, cough, shortness of breath, wheezing and stridor.   Cardiovascular: Negative for chest pain and leg swelling.  Gastrointestinal: Negative for nausea, vomiting, abdominal pain, diarrhea, constipation and abdominal distention.  Genitourinary: Negative for dysuria, urgency, polyuria, frequency, hematuria, flank pain, decreased urine volume, enuresis, difficulty urinating and dyspareunia.  Musculoskeletal: Negative for myalgias, back pain, joint swelling, arthralgias and gait problem.  Skin: Negative for color change, pallor, rash and wound.  Neurological: Positive for tremors (nocturnal with BS down to 25-50). Negative for dizziness, seizures, syncope, facial asymmetry, speech difficulty, weakness, light-headedness, numbness and headaches.  Hematological: Negative for polydipsia, polyphagia and adenopathy. Does not bruise/bleed easily.  Psychiatric/Behavioral: Positive for dysphoric mood. Negative for suicidal ideas, hallucinations, behavioral problems, confusion, sleep disturbance, self-injury, decreased concentration and agitation. The patient is not nervous/anxious and is not hyperactive.        Objective:   Physical Exam  Vitals reviewed. Constitutional: She is oriented to person, place, and time. She appears well-developed and well-nourished. No distress.  HENT:  Head: Normocephalic and atraumatic.  Mouth/Throat: Oropharynx is clear and moist. No oropharyngeal exudate.  Eyes: Conjunctivae are normal. Right eye exhibits no discharge. Left eye exhibits no discharge. No scleral icterus.  Neck: Normal range of motion. Neck supple. No JVD present. No tracheal deviation present. No thyromegaly present.  Cardiovascular: Normal rate, regular rhythm, normal heart sounds and intact distal pulses.  Exam reveals no gallop and no friction rub.   No murmur heard. Pulmonary/Chest: Effort normal and breath sounds normal. No stridor. No respiratory distress. She has no  wheezes. She has no rales. She exhibits no tenderness.  Abdominal: Soft. Bowel sounds are normal. She exhibits no distension and no mass. There is no tenderness. There is no rebound and no guarding.  Musculoskeletal: Normal range of motion. She exhibits no edema and  no tenderness.  Lymphadenopathy:    She has no cervical adenopathy.  Neurological: She is oriented to person, place, and time.  Skin: Skin is warm and dry. No rash noted. She is not diaphoretic. No erythema. No pallor.  Psychiatric: She has a normal mood and affect. Her behavior is normal. Judgment and thought content normal.     Lab Results  Component Value Date   WBC 9.0 12/01/2010   HGB 12.2 12/30/2010   HCT 36.0 12/30/2010   PLT 233.0 12/01/2010   GLUCOSE 130* 12/30/2010   CHOL 158 08/01/2010   TRIG 129.0 08/01/2010   HDL 55.30 08/01/2010   LDLDIRECT 160.2 03/01/2010   LDLCALC 77 08/01/2010   ALT 19 12/01/2010   AST 18 12/01/2010   NA 142 12/30/2010   K 4.1 12/30/2010   CL 106 12/01/2010   CREATININE 0.6 12/01/2010   BUN 16 12/01/2010   CO2 28 12/01/2010   TSH 0.28* 12/01/2010   HGBA1C 6.8* 12/01/2010   MICROALBUR 3.5* 03/01/2010       Assessment & Plan:

## 2011-03-27 NOTE — Assessment & Plan Note (Signed)
She is having significant nocturnal hypoglycemia so I have told her to stop the glyburide, she will continue the other two agents. Today I will check her a1c and will monitor her renal function

## 2011-03-27 NOTE — Assessment & Plan Note (Signed)
Her BP is well controlled, I will check her lytes today 

## 2011-03-27 NOTE — Patient Instructions (Signed)

## 2011-03-27 NOTE — Assessment & Plan Note (Signed)
She is doing well on vytorin 

## 2011-03-27 NOTE — Assessment & Plan Note (Signed)
I will recheck her CBC today 

## 2011-03-27 NOTE — Assessment & Plan Note (Signed)
She has restarted pristiq and is doing really well on it so she will continue it

## 2011-03-29 ENCOUNTER — Encounter: Payer: Self-pay | Admitting: Internal Medicine

## 2011-03-29 MED ORDER — AMPICILLIN 500 MG PO CAPS: 500.0000 mg | ORAL_CAPSULE | Freq: Four times a day (QID) | ORAL | Status: DC

## 2011-03-30 NOTE — Progress Notes (Signed)
This encounter was created in error - please disregard.

## 2011-04-03 ENCOUNTER — Telehealth: Payer: Self-pay | Admitting: *Deleted

## 2011-04-03 DIAGNOSIS — N390 Urinary tract infection, site not specified: Secondary | ICD-10-CM

## 2011-04-03 MED ORDER — NITROFURANTOIN MONOHYD MACRO 100 MG PO CAPS: 100.0000 mg | ORAL_CAPSULE | Freq: Two times a day (BID) | ORAL | Status: DC

## 2011-04-03 NOTE — Telephone Encounter (Signed)
Patient informed. 

## 2011-04-03 NOTE — Telephone Encounter (Signed)
done

## 2011-04-03 NOTE — Telephone Encounter (Signed)
Pt c/o not being able to take Ampicillin due to nausea and request new alternative Rx. Please advise.

## 2011-04-10 ENCOUNTER — Ambulatory Visit: Payer: BC Managed Care – PPO | Admitting: Surgery

## 2011-04-11 ENCOUNTER — Ambulatory Visit: Payer: BC Managed Care – PPO

## 2011-04-12 ENCOUNTER — Encounter: Payer: Self-pay | Admitting: Internal Medicine

## 2011-04-12 ENCOUNTER — Ambulatory Visit
Admission: RE | Admit: 2011-04-12 | Discharge: 2011-04-12 | Disposition: A | Discharge status: Home / Self Care | Payer: BC Managed Care – PPO | Source: Ambulatory Visit | Attending: Internal Medicine | Admitting: Internal Medicine

## 2011-04-12 ENCOUNTER — Other Ambulatory Visit: Payer: BC Managed Care – PPO

## 2011-04-12 ENCOUNTER — Telehealth: Payer: Self-pay | Admitting: *Deleted

## 2011-04-12 ENCOUNTER — Ambulatory Visit (INDEPENDENT_AMBULATORY_CARE_PROVIDER_SITE_OTHER): Payer: BC Managed Care – PPO | Admitting: Internal Medicine

## 2011-04-12 VITALS — BP 118/66 | HR 69 | Temp 97.8°F | Resp 14 | Wt 160.4 lb

## 2011-04-12 DIAGNOSIS — N390 Urinary tract infection, site not specified: Secondary | ICD-10-CM

## 2011-04-12 DIAGNOSIS — B351 Tinea unguium: Secondary | ICD-10-CM | POA: Insufficient documentation

## 2011-04-12 DIAGNOSIS — Z1231 Encounter for screening mammogram for malignant neoplasm of breast: Secondary | ICD-10-CM

## 2011-04-12 DIAGNOSIS — E785 Hyperlipidemia, unspecified: Secondary | ICD-10-CM

## 2011-04-12 DIAGNOSIS — I10 Essential (primary) hypertension: Secondary | ICD-10-CM

## 2011-04-12 MED ORDER — TERBINAFINE HCL 250 MG PO TABS: 250.0000 mg | ORAL_TABLET | Freq: Every day | ORAL | Status: DC

## 2011-04-12 NOTE — Telephone Encounter (Signed)
Same Day Abstraction. 

## 2011-04-12 NOTE — Assessment & Plan Note (Signed)
Start Lamisil and recheck her LFT's in one month

## 2011-04-12 NOTE — Assessment & Plan Note (Signed)
I will recheck her urine culture 

## 2011-04-12 NOTE — Assessment & Plan Note (Signed)
Her BP is well controlled 

## 2011-04-12 NOTE — Progress Notes (Signed)
  Subjective:    Patient ID: Theresa Harrington, female    DOB: 1956/01/19, 55 y.o.   MRN: [id number]  HPI She returns for f/up and tells me that she has completed a course of antibiotics for the UTI and she has no urinary s/s today. She is concerned about a problem with her right thumb for several months with a fingernail that is painful and is discolored.   Review of Systems  Constitutional: Negative for fever, chills, diaphoresis, activity change, appetite change, fatigue and unexpected weight change.  HENT: Negative.   Eyes: Negative.   Respiratory: Negative for cough, chest tightness, shortness of breath, wheezing and stridor.   Cardiovascular: Negative for chest pain, palpitations and leg swelling.  Gastrointestinal: Negative for nausea, vomiting, abdominal pain and diarrhea.  Genitourinary: Negative for dysuria, urgency, frequency, hematuria, decreased urine volume, enuresis, difficulty urinating and dyspareunia.  Musculoskeletal: Negative for myalgias, back pain, joint swelling, arthralgias and gait problem.  Skin: Negative for color change, pallor, rash and wound.  Neurological: Negative for dizziness, tremors, seizures, syncope, facial asymmetry, speech difficulty, weakness, light-headedness, numbness and headaches.  Hematological: Negative for adenopathy. Does not bruise/bleed easily.  Psychiatric/Behavioral: Negative.        Objective:   Physical Exam  Vitals reviewed. Constitutional: She is oriented to person, place, and time. She appears well-developed and well-nourished. No distress.  HENT:  Head: Normocephalic and atraumatic.  Mouth/Throat: Oropharynx is clear and moist. No oropharyngeal exudate.  Eyes: Conjunctivae are normal. Right eye exhibits no discharge. Left eye exhibits no discharge. No scleral icterus.  Neck: Normal range of motion. Neck supple. No JVD present. No tracheal deviation present. No thyromegaly present.  Cardiovascular: Normal rate, regular rhythm, normal  heart sounds and intact distal pulses.  Exam reveals no gallop and no friction rub.   No murmur heard. Pulmonary/Chest: Effort normal and breath sounds normal. No stridor. No respiratory distress. She has no wheezes. She has no rales. She exhibits no tenderness.  Abdominal: Soft. Bowel sounds are normal. She exhibits no distension and no mass. There is no tenderness. There is no rebound and no guarding.  Musculoskeletal: Normal range of motion. She exhibits no edema and no tenderness.  Lymphadenopathy:    She has no cervical adenopathy.  Neurological: She is oriented to person, place, and time.  Skin: Skin is warm and dry. No rash noted. She is not diaphoretic. No erythema. No pallor.       Right thumb fingernail shows 50% involvement with subungual debris and dystrophy, there is no erythema or exudate and no ttp.  Psychiatric: She has a normal mood and affect. Her behavior is normal. Judgment and thought content normal.          Assessment & Plan:

## 2011-04-12 NOTE — Patient Instructions (Signed)

## 2011-04-12 NOTE — Assessment & Plan Note (Signed)
She is doing well on vytorin 

## 2011-04-12 NOTE — Telephone Encounter (Signed)
Error Done.  

## 2011-04-13 LAB — HM MAMMOGRAPHY: HM Mammogram: NORMAL

## 2011-04-13 LAB — CULTURE, URINE COMPREHENSIVE: Organism ID, Bacteria: NO GROWTH

## 2011-04-18 ENCOUNTER — Ambulatory Visit: Payer: BC Managed Care – PPO | Admitting: Internal Medicine

## 2011-04-18 ENCOUNTER — Telehealth: Payer: Self-pay | Admitting: Internal Medicine

## 2011-04-18 NOTE — Telephone Encounter (Signed)
The pt called and stated she has a cough.  I know we dont call antibiotics into the pharmacy, however, she was hoping a cough syrup could be called into the pharmacy.

## 2011-04-20 ENCOUNTER — Ambulatory Visit: Payer: BC Managed Care – PPO | Admitting: Internal Medicine

## 2011-04-20 DIAGNOSIS — Z0289 Encounter for other administrative examinations: Secondary | ICD-10-CM

## 2011-04-28 ENCOUNTER — Encounter: Payer: Self-pay | Admitting: Surgery

## 2011-05-01 ENCOUNTER — Ambulatory Visit (INDEPENDENT_AMBULATORY_CARE_PROVIDER_SITE_OTHER): Payer: BC Managed Care – PPO | Admitting: Surgery

## 2011-05-01 ENCOUNTER — Encounter: Payer: Self-pay | Admitting: Surgery

## 2011-05-01 VITALS — BP 106/66 | HR 76 | Resp 16 | Ht 64.0 in | Wt 163.0 lb

## 2011-05-01 DIAGNOSIS — I739 Peripheral vascular disease, unspecified: Secondary | ICD-10-CM | POA: Insufficient documentation

## 2011-05-01 NOTE — Progress Notes (Signed)
[facility]   Patient name: Theresa Harrington MRN: [id number] DOB: 1955/08/16 Sex: female     Chief Complaint  Patient presents with  . PVD    3 month f/up    HISTORY OF PRESENT ILLNESS: The patient is back today for her claudication symptoms. Her left leg is worse than the right. She has been taking cilostazol as well as trying to increase her activity level. She states that she has not noted any significant change in her symptoms. Her symptoms are somewhat inconsistent. She is able to get to a normal day without problems on some occasions. She has been trying to increase her activity. Her most recent LDL cholesterol is 56 her HDL is 58. Her most recent A1c was 6.8 and her creatinine was 0.7.  Past Medical History  Diagnosis Date  . Anemia   . Hyperlipidemia   . Hypertension   . Hypothyroidism   . Diabetes mellitus     type II    Past Surgical History  Procedure Date  . Gastric bypass   . Hand surgery     right hand  . Knee surgery     right   . Breast surgery     breast lift/bil  . Thigh lift     Bil  . Gastric banding port revision     2007  . Belpharoptosis repair     eyelid lift    History   Social History  . Marital Status: Single    Spouse Name: N/A    Number of Children: N/A  . Years of Education: N/A   Occupational History  . tester    Social History Main Topics  . Smoking status: Former Smoker -- 20 years    Quit date: 09/06/2002  . Smokeless tobacco: Never Used  . Alcohol Use: No  . Drug Use: No  . Sexually Active: Not Currently   Other Topics Concern  . Not on file   Social History Narrative   Regular exercise- yes    Family History  Problem Relation Age of Onset  . Arthritis Other   . Cancer Other     colon  . Hypertension Other   . Stroke Other   . Diabetes Father   . Heart disease Father   . Diabetes Sister   . Heart disease Sister   . Diabetes Brother   . Heart disease Brother   . Colon cancer  Maternal Aunt   . Diabetes Brother   . Diabetes Brother   . Kidney disease Mother     Allergies as of 05/01/2011 - Review Complete 05/01/2011  Allergen Reaction Noted  . Ampicillin Nausea And Vomiting 04/12/2011  . Codeine    . Lisinopril  10/05/2008  . Penicillins Nausea And Vomiting 05/01/2011    Current Outpatient Prescriptions on File Prior to Visit  Medication Sig Dispense Refill  . cilostazol (PLETAL) 100 MG tablet TAKE ONE TABLET BY MOUTH TWICE DAILY  60 tablet  11  . desvenlafaxine (PRISTIQ) 50 MG 24 hr tablet Take 1 tablet (50 mg total) by mouth daily.  30 tablet  11  . ezetimibe-simvastatin (VYTORIN) 10-40 MG per tablet Take 1 tablet by mouth daily.  30 tablet  11  . ferrous sulfate (SLOW RELEASE IRON) 160 (50 FE) MG TBCR Take 1 tablet by mouth daily.        [name] linagliptin (TRADJENTA) 5 MG TABS tablet Take 1 tablet (5 mg total) by mouth daily.  70 tablet  0  . metFORMIN (GLUCOPHAGE) 1000 MG tablet TAKE ONE TABLET BY MOUTH TWICE DAILY  180 tablet  3  . terbinafine (LAMISIL) 250 MG tablet Take 1 tablet (250 mg total) by mouth daily.  30 tablet  0  . valsartan (DIOVAN) 160 MG tablet Take 160 mg by mouth daily.           REVIEW OF SYSTEMS: No change from prior visit   PHYSICAL EXAMINATION:   Vital signs are BP 106/66  Pulse 76  Resp 16  Ht 5\' 4"  (1.626 m)  Wt 163 lb (73.936 kg)  BMI 27.98 kg/m2  SpO2 99% General: The patient appears their stated age. HEENT:  No gross abnormalities Pulmonary:  Non labored breathing Musculoskeletal: There are no major deformities. Neurologic: No focal weakness or paresthesias are detected, Skin: There are no ulcer or rashes noted. Psychiatric: The patient has normal affect. Cardiovascular: Left pedal pulses are not palpable   Diagnostic Studies None today  Assessment: Peripheral vascular disease, left greater than right claudication Plan: We again reviewed the natural history of peripheral vascular disease. It is unclear  whether she has had any benefit from the cilostazol. She's going to try to monitor this better. If she is receiving no benefit I recommend discontinuing it. We also again talked about the importance of an exercise program. At this point I did not feel like her symptoms her lifestyle limiting and I would not recommend any invasive intervention at this time. I answered all the patient's questions today. I'll plan on seeing her back in one year with a followup ultrasound  V. Charlena Cross, M.D. [facility] [place] Office: [phone] Pager:  [phone]

## 2011-05-23 ENCOUNTER — Ambulatory Visit: Payer: BC Managed Care – PPO | Admitting: Internal Medicine

## 2011-08-31 ENCOUNTER — Ambulatory Visit: Payer: BC Managed Care – PPO | Admitting: Internal Medicine

## 2011-08-31 DIAGNOSIS — Z0289 Encounter for other administrative examinations: Secondary | ICD-10-CM

## 2011-09-05 ENCOUNTER — Other Ambulatory Visit: Payer: Self-pay

## 2011-09-05 DIAGNOSIS — E785 Hyperlipidemia, unspecified: Secondary | ICD-10-CM

## 2011-09-05 DIAGNOSIS — E119 Type 2 diabetes mellitus without complications: Secondary | ICD-10-CM

## 2011-09-05 NOTE — Telephone Encounter (Signed)
Received fax from pharmacy stating that insurance will not cover vytorin w/o a prior auth. Need to  Call [phone] to proceed with request (ID# [id number])

## 2011-09-08 LAB — HM DIABETES EYE EXAM

## 2011-09-10 ENCOUNTER — Other Ambulatory Visit: Payer: Self-pay | Admitting: Internal Medicine

## 2011-09-11 ENCOUNTER — Other Ambulatory Visit: Payer: Self-pay

## 2011-09-11 MED ORDER — VALSARTAN 160 MG PO TABS: 160.0000 mg | ORAL_TABLET | Freq: Every day | ORAL | Status: DC

## 2011-09-11 NOTE — Telephone Encounter (Signed)
Diovan refill

## 2011-09-14 ENCOUNTER — Encounter (INDEPENDENT_AMBULATORY_CARE_PROVIDER_SITE_OTHER): Payer: BC Managed Care – PPO | Admitting: Ophthalmology

## 2011-09-21 ENCOUNTER — Encounter: Payer: Self-pay | Admitting: Internal Medicine

## 2011-09-21 ENCOUNTER — Ambulatory Visit (INDEPENDENT_AMBULATORY_CARE_PROVIDER_SITE_OTHER): Payer: BC Managed Care – PPO | Admitting: Internal Medicine

## 2011-09-21 ENCOUNTER — Other Ambulatory Visit (INDEPENDENT_AMBULATORY_CARE_PROVIDER_SITE_OTHER): Payer: BC Managed Care – PPO

## 2011-09-21 VITALS — BP 128/80 | HR 76 | Temp 97.0°F | Resp 16 | Wt 165.2 lb

## 2011-09-21 DIAGNOSIS — D649 Anemia, unspecified: Secondary | ICD-10-CM

## 2011-09-21 DIAGNOSIS — E119 Type 2 diabetes mellitus without complications: Secondary | ICD-10-CM

## 2011-09-21 DIAGNOSIS — E042 Nontoxic multinodular goiter: Secondary | ICD-10-CM

## 2011-09-21 DIAGNOSIS — E785 Hyperlipidemia, unspecified: Secondary | ICD-10-CM

## 2011-09-21 DIAGNOSIS — N390 Urinary tract infection, site not specified: Secondary | ICD-10-CM

## 2011-09-21 DIAGNOSIS — I10 Essential (primary) hypertension: Secondary | ICD-10-CM

## 2011-09-21 DIAGNOSIS — E0590 Thyrotoxicosis, unspecified without thyrotoxic crisis or storm: Secondary | ICD-10-CM

## 2011-09-21 LAB — URINALYSIS, ROUTINE W REFLEX MICROSCOPIC
Bilirubin Urine: NEGATIVE
Ketones, ur: NEGATIVE
pH: 5.5 (ref 5.0–8.0)

## 2011-09-21 LAB — COMPREHENSIVE METABOLIC PANEL
ALT: 19 U/L (ref 0–35)
Albumin: 4 g/dL (ref 3.5–5.2)
CO2: 27 mEq/L (ref 19–32)
Calcium: 9.8 mg/dL (ref 8.4–10.5)
Chloride: 105 mEq/L (ref 96–112)
GFR: 78.15 mL/min (ref >60.00)
Potassium: 4.4 mEq/L (ref 3.5–5.1)
Sodium: 140 mEq/L (ref 135–145)
Total Protein: 7.1 g/dL (ref 6.0–8.3)

## 2011-09-21 LAB — CBC WITH DIFFERENTIAL/PLATELET
Basophils Absolute: 0 10*3/uL (ref 0.0–0.1)
Hemoglobin: 12.1 g/dL (ref 12.0–15.0)
Lymphocytes Relative: 37.6 % (ref 12.0–46.0)
Monocytes Relative: 7.3 % (ref 3.0–12.0)
Platelets: 258 10*3/uL (ref 150.0–400.0)
RDW: 14.5 % (ref 11.5–14.6)
WBC: 6.5 10*3/uL (ref 4.5–10.5)

## 2011-09-21 LAB — LIPID PANEL
HDL: 55.1 mg/dL (ref >39.00)
Total CHOL/HDL Ratio: 3

## 2011-09-21 LAB — CK: Total CK: 91 U/L (ref 7–177)

## 2011-09-21 LAB — HM DIABETES FOOT EXAM: HM Diabetic Foot Exam: NORMAL

## 2011-09-21 LAB — TSH: TSH: 0.32 u[IU]/mL — ABNORMAL LOW (ref 0.35–5.50)

## 2011-09-21 MED ORDER — SAXAGLIPTIN HCL 5 MG PO TABS: 5.0000 mg | ORAL_TABLET | Freq: Every day | ORAL | Status: DC

## 2011-09-21 NOTE — Assessment & Plan Note (Signed)
She is doing well on vytorin, I will check her FLP and CMP today

## 2011-09-21 NOTE — Assessment & Plan Note (Signed)
Her BP is well controlled, I will check her lytes and renal function today 

## 2011-09-21 NOTE — Assessment & Plan Note (Signed)
CBC today.  

## 2011-09-21 NOTE — Patient Instructions (Signed)
Hypercholesterolemia High Blood Cholesterol Cholesterol is a white, waxy, fat-like protein needed by your body in small amounts. The liver makes all the cholesterol you need. It is carried from the liver by the blood through the blood vessels. Deposits (plaque) may build up on blood vessel walls. This makes the arteries narrower and stiffer. Plaque increases the risk for heart attack and stroke. You cannot feel your cholesterol level even if it is very high. The only way to know is by a blood test to check your lipid (fats) levels. Once you know your cholesterol levels, you should keep a record of the test results. Work with your caregiver to to keep your levels in the desired range. WHAT THE RESULTS MEAN:  Total cholesterol is a rough measure of all the cholesterol in your blood.   LDL is the so-called bad cholesterol. This is the type that deposits cholesterol in the walls of the arteries. You want this level to be low.   HDL is the good cholesterol because it cleans the arteries and carries the LDL away. You want this level to be high.   Triglycerides are fat that the body can either burn for energy or store. High levels are closely linked to heart disease.  DESIRED LEVELS:  Total cholesterol below 200.   LDL below 100 for people at risk, below 70 for very high risk.   HDL above 50 is good, above 60 is best.   Triglycerides below 150.  HOW TO LOWER YOUR CHOLESTEROL:  Diet.   Choose fish or white meat chicken and turkey, roasted or baked. Limit fatty cuts of red meat, fried foods, and processed meats, such as sausage and lunch meat.   Eat lots of fresh fruits and vegetables. Choose whole grains, beans, pasta, potatoes and cereals.   Use only small amounts of olive, corn or canola oils. Avoid butter, mayonnaise, shortening or palm kernel oils. Avoid foods with trans-fats.   Use skim/nonfat milk and low-fat/nonfat yogurt and cheeses. Avoid whole milk, cream, ice cream, egg yolks and  cheeses. Healthy desserts include angel food cake, gingersnaps, animal crackers, hard candy, popsicles, and low-fat/nonfat frozen yogurt. Avoid pastries, cakes, pies and cookies.   Exercise.   A regular program helps decrease LDL and raises HDL.   Helps with weight control.   Do things that increase your activity level like gardening, walking, or taking the stairs.   Medication.   May be prescribed by your caregiver to help lowering cholesterol and the risk for heart disease.   You may need medicine even if your levels are normal if you have several risk factors.  HOME CARE INSTRUCTIONS   Follow your diet and exercise programs as suggested by your caregiver.   Take medications as directed.   Have blood work done when your caregiver feels it is necessary.  MAKE SURE YOU:   Understand these instructions.   Will watch your condition.   Will get help right away if you are not doing well or get worse.  Document Released: 04/17/2005 Document Revised: 04/06/2011 Document Reviewed: 10/03/2006 Exit[employer] Patient Information 2012 [employer].Diabetes, Type 2 Diabetes is a long-lasting (chronic) disease. In type 2 diabetes, the pancreas does not make enough insulin (a hormone), and the body does not respond normally to the insulin that is made. This type of diabetes was also previously called adult-onset diabetes. It usually occurs after the age of 40, but it can occur at any age.  CAUSES  Type 2 diabetes happens because the   pancreasis not making enough insulin or your body has trouble using the insulin that your pancreas does make properly. SYMPTOMS   Drinking more than usual.   Urinating more than usual.   Blurred vision.   Dry, itchy skin.   Frequent infections.   Feeling more tired than usual (fatigue).  DIAGNOSIS The diagnosis of type 2 diabetes is usually made by one of the following tests:  Fasting blood glucose test. You will not eat for at least 8 hours and then  take a blood test.   Random blood glucose test. Your blood glucose (sugar) is checked at any time of the day regardless of when you ate.   Oral glucose tolerance test (OGTT). Your blood glucose is measured after you have not eaten (fasted) and then after you drink a glucose containing beverage.  TREATMENT   Healthy eating.   Exercise.   Medicine, if needed.   Monitoring blood glucose.   Seeing your caregiver regularly.  HOME CARE INSTRUCTIONS   Check your blood glucose at least once a day. More frequent monitoring may be necessary, depending on your medicines and on how well your diabetes is controlled. Your caregiver will advise you.   Take your medicine as directed by your caregiver.   Do not smoke.   Make wise food choices. Ask your caregiver for information. Weight loss can improve your diabetes.   Learn about low blood glucose (hypoglycemia) and how to treat it.   Get your eyes checked regularly.   Have a yearly physical exam. Have your blood pressure checked and your blood and urine tested.   Wear a pendant or bracelet saying that you have diabetes.   Check your feet every night for cuts, sores, blisters, and redness. Let your caregiver know if you have any problems.  SEEK MEDICAL CARE IF:   You have problems keeping your blood glucose in target range.   You have problems with your medicines.   You have symptoms of an illness that do not improve after 24 hours.   You have a sore or wound that is not healing.   You notice a change in vision or a new problem with your vision.   You have a fever.  MAKE SURE YOU:  Understand these instructions.   Will watch your condition.   Will get help right away if you are not doing well or get worse.  Document Released: 04/17/2005 Document Revised: 04/06/2011 Document Reviewed: 10/03/2010 Exit[employer] Patient Information 2012 [employer]. 

## 2011-09-21 NOTE — Assessment & Plan Note (Signed)
I have changed tradjenta to onglyza due to formulary restrictions. Today I will check her a1c and her renal function.

## 2011-09-21 NOTE — Progress Notes (Signed)
Subjective:    Patient ID: Theresa Harrington, female    DOB: 07-12-1955, 56 y.o.   MRN: [id number]  Diabetes She presents for her follow-up diabetic visit. She has type 2 diabetes mellitus. Her disease course has been stable. There are no hypoglycemic associated symptoms. Pertinent negatives for hypoglycemia include no dizziness, headaches, pallor, seizures, speech difficulty or tremors. Pertinent negatives for diabetes include no blurred vision, no chest pain, no fatigue, no foot paresthesias, no foot ulcerations, no polydipsia, no polyphagia, no polyuria, no visual change, no weakness and no weight loss. There are no hypoglycemic complications. Symptoms are stable. Diabetic complications include PVD. Current diabetic treatment includes oral agent (dual therapy). She is compliant with treatment most of the time. Her weight is stable. She is following a generally healthy diet. Meal planning includes avoidance of concentrated sweets. She has not had a previous visit with a dietician. She participates in exercise intermittently. There is no change in her home blood glucose trend. Her breakfast blood glucose range is generally 110-130 mg/dl. Her lunch blood glucose range is generally 130-140 mg/dl. Her dinner blood glucose range is generally 130-140 mg/dl. Her highest blood glucose is >200 mg/dl. Her overall blood glucose range is 140-180 mg/dl. An ACE inhibitor/angiotensin II receptor blocker is being taken. She does not see a podiatrist.Eye exam is current.  Hyperlipidemia This is a chronic problem. The current episode started more than 1 year ago. The problem is controlled. Recent lipid tests were reviewed and are variable. Exacerbating diseases include diabetes. She has no history of chronic renal disease, hypothyroidism, liver disease, obesity or nephrotic syndrome. Factors aggravating her hyperlipidemia include no known factors. Pertinent negatives include no chest pain, focal sensory loss, focal weakness, leg  pain, myalgias or shortness of breath. Current antihyperlipidemic treatment includes statins and ezetimibe. The current treatment provides moderate improvement of lipids. Compliance problems include adherence to exercise and adherence to diet.       Review of Systems  Constitutional: Negative for fever, chills, weight loss, diaphoresis, activity change, appetite change, fatigue and unexpected weight change.  HENT: Negative.   Eyes: Negative.  Negative for blurred vision.  Respiratory: Negative for apnea, cough, choking, chest tightness, shortness of breath, wheezing and stridor.   Cardiovascular: Negative for chest pain, palpitations and leg swelling.  Gastrointestinal: Negative for nausea, vomiting, abdominal pain, diarrhea, constipation and anal bleeding.  Genitourinary: Negative.  Negative for dysuria, urgency, polyuria, frequency, hematuria, flank pain, decreased urine volume, enuresis, difficulty urinating and dyspareunia.  Musculoskeletal: Negative for myalgias, back pain, joint swelling, arthralgias and gait problem.  Skin: Negative for color change, pallor, rash and wound.  Neurological: Negative for dizziness, tremors, focal weakness, seizures, syncope, facial asymmetry, speech difficulty, weakness, light-headedness, numbness and headaches.  Hematological: Negative for polydipsia, polyphagia and adenopathy. Does not bruise/bleed easily.  Psychiatric/Behavioral: Negative.        Objective:   Physical Exam  Vitals reviewed. Constitutional: She is oriented to person, place, and time. She appears well-developed and well-nourished. No distress.  HENT:  Head: Normocephalic and atraumatic.  Mouth/Throat: Oropharynx is clear and moist. No oropharyngeal exudate.  Eyes: Conjunctivae are normal. Right eye exhibits no discharge. Left eye exhibits no discharge. No scleral icterus.  Neck: Normal range of motion. Neck supple. No JVD present. No tracheal deviation present. No thyromegaly  present.  Cardiovascular: Normal rate, regular rhythm, normal heart sounds and intact distal pulses.  Exam reveals no gallop and no friction rub.   No murmur heard. Pulmonary/Chest: Effort normal and breath sounds  normal. No stridor. No respiratory distress. She has no wheezes. She has no rales. She exhibits no tenderness.  Abdominal: Soft. Bowel sounds are normal. She exhibits no distension and no mass. There is no tenderness. There is no rebound and no guarding.  Musculoskeletal: Normal range of motion. She exhibits no edema and no tenderness.  Lymphadenopathy:    She has no cervical adenopathy.  Neurological: She is oriented to person, place, and time.  Skin: Skin is warm and dry. No rash noted. She is not diaphoretic. No erythema. No pallor.  Psychiatric: She has a normal mood and affect. Her behavior is normal. Judgment and thought content normal.      Lab Results  Component Value Date   WBC 8.3 03/27/2011   HGB 12.4 03/27/2011   HCT 37.3 03/27/2011   PLT 252.0 03/27/2011   GLUCOSE 114* 03/27/2011   CHOL 131 03/27/2011   TRIG 85.0 03/27/2011   HDL 58.40 03/27/2011   LDLDIRECT 160.2 03/01/2010   LDLCALC 56 03/27/2011   ALT 15 03/27/2011   AST 15 03/27/2011   NA 141 03/27/2011   K 4.3 03/27/2011   CL 106 03/27/2011   CREATININE 0.7 03/27/2011   BUN 13 03/27/2011   CO2 26 03/27/2011   TSH 0.08* 03/27/2011   HGBA1C 6.8* 03/27/2011   MICROALBUR 3.5* 03/01/2010      Assessment & Plan:

## 2011-09-21 NOTE — Assessment & Plan Note (Signed)
She has no s/s, I will check her UA and ur clx today

## 2011-09-21 NOTE — Assessment & Plan Note (Signed)
She has no s/s, I will check her TSh today

## 2011-09-24 LAB — CULTURE, URINE COMPREHENSIVE: Colony Count: >=100000

## 2011-09-26 ENCOUNTER — Telehealth: Payer: Self-pay

## 2011-09-26 DIAGNOSIS — E119 Type 2 diabetes mellitus without complications: Secondary | ICD-10-CM

## 2011-09-26 MED ORDER — SITAGLIPTIN PHOSPHATE 100 MG PO TABS: 100.0000 mg | ORAL_TABLET | Freq: Every day | ORAL | Status: DC

## 2011-09-26 NOTE — Telephone Encounter (Signed)
Changed to januvia 

## 2011-09-26 NOTE — Telephone Encounter (Signed)
Pt called stating that Onglyza is too expensive - $200+ with insurance. Pt is requesting a cheaper alternative, please advise.

## 2011-09-27 NOTE — Telephone Encounter (Signed)
Pt advised of Rx change as well as lab results per letter.

## 2011-09-29 ENCOUNTER — Telehealth: Payer: Self-pay | Admitting: Internal Medicine

## 2011-09-29 NOTE — Telephone Encounter (Signed)
Ask her to come get samples 

## 2011-09-29 NOTE — Telephone Encounter (Signed)
Pt states Alma Friendly is too expensive as well, please call in something generic( that she can afford)

## 2011-09-29 NOTE — Telephone Encounter (Signed)
Patient given samples

## 2011-10-06 ENCOUNTER — Encounter (INDEPENDENT_AMBULATORY_CARE_PROVIDER_SITE_OTHER): Payer: BC Managed Care – PPO | Admitting: Ophthalmology

## 2011-10-20 ENCOUNTER — Encounter (INDEPENDENT_AMBULATORY_CARE_PROVIDER_SITE_OTHER): Payer: BC Managed Care – PPO | Admitting: Ophthalmology

## 2011-11-12 ENCOUNTER — Other Ambulatory Visit: Payer: Self-pay | Admitting: Internal Medicine

## 2011-12-21 ENCOUNTER — Ambulatory Visit (INDEPENDENT_AMBULATORY_CARE_PROVIDER_SITE_OTHER): Payer: BC Managed Care – PPO | Admitting: Internal Medicine

## 2011-12-21 ENCOUNTER — Encounter: Payer: Self-pay | Admitting: Internal Medicine

## 2011-12-21 ENCOUNTER — Other Ambulatory Visit (INDEPENDENT_AMBULATORY_CARE_PROVIDER_SITE_OTHER): Payer: BC Managed Care – PPO

## 2011-12-21 VITALS — BP 108/62 | HR 54 | Temp 97.8°F | Resp 16 | Wt 161.0 lb

## 2011-12-21 DIAGNOSIS — Z23 Encounter for immunization: Secondary | ICD-10-CM

## 2011-12-21 DIAGNOSIS — I10 Essential (primary) hypertension: Secondary | ICD-10-CM

## 2011-12-21 DIAGNOSIS — E042 Nontoxic multinodular goiter: Secondary | ICD-10-CM

## 2011-12-21 DIAGNOSIS — E0590 Thyrotoxicosis, unspecified without thyrotoxic crisis or storm: Secondary | ICD-10-CM

## 2011-12-21 DIAGNOSIS — E119 Type 2 diabetes mellitus without complications: Secondary | ICD-10-CM

## 2011-12-21 DIAGNOSIS — N390 Urinary tract infection, site not specified: Secondary | ICD-10-CM

## 2011-12-21 DIAGNOSIS — E785 Hyperlipidemia, unspecified: Secondary | ICD-10-CM

## 2011-12-21 LAB — COMPREHENSIVE METABOLIC PANEL
ALT: 15 U/L (ref 0–35)
BUN: 14 mg/dL (ref 6–23)
CO2: 27 mEq/L (ref 19–32)
Calcium: 9.4 mg/dL (ref 8.4–10.5)
Chloride: 105 mEq/L (ref 96–112)
Creatinine, Ser: 0.5 mg/dL (ref 0.4–1.2)
GFR: 133.16 mL/min (ref >60.00)

## 2011-12-21 LAB — T4: T4, Total: 9 ug/dL (ref 5.0–12.5)

## 2011-12-21 LAB — CBC WITH DIFFERENTIAL/PLATELET
Basophils Relative: 0.5 % (ref 0.0–3.0)
Eosinophils Relative: 0.8 % (ref 0.0–5.0)
Hemoglobin: 11.8 g/dL — ABNORMAL LOW (ref 12.0–15.0)
Lymphocytes Relative: 36 % (ref 12.0–46.0)
Monocytes Relative: 5.6 % (ref 3.0–12.0)
Neutro Abs: 4.1 10*3/uL (ref 1.4–7.7)
RBC: 4.34 Mil/uL (ref 3.87–5.11)

## 2011-12-21 LAB — URINALYSIS, ROUTINE W REFLEX MICROSCOPIC
Ketones, ur: NEGATIVE
Specific Gravity, Urine: >=1.03 (ref 1.000–1.030)
Urine Glucose: NEGATIVE
Urobilinogen, UA: 0.2 (ref 0.0–1.0)

## 2011-12-21 LAB — HM DIABETES FOOT EXAM: HM Diabetic Foot Exam: NORMAL

## 2011-12-21 LAB — T3, FREE: T3, Free: 2.8 pg/mL (ref 2.3–4.2)

## 2011-12-21 LAB — TSH: TSH: 0.19 u[IU]/mL — ABNORMAL LOW (ref 0.35–5.50)

## 2011-12-21 NOTE — Patient Instructions (Signed)

## 2011-12-21 NOTE — Progress Notes (Signed)
Subjective:    Patient ID: Theresa Harrington, female    DOB: 12-03-1955, 56 y.o.   MRN: [id number]  Diabetes She presents for her follow-up diabetic visit. She has type 2 diabetes mellitus. Her disease course has been stable. There are no hypoglycemic associated symptoms. Pertinent negatives for hypoglycemia include no dizziness, headaches, pallor, seizures, speech difficulty or tremors. Pertinent negatives for diabetes include no blurred vision, no chest pain, no fatigue, no foot paresthesias, no foot ulcerations, no polydipsia, no polyphagia, no polyuria, no visual change, no weakness and no weight loss. There are no hypoglycemic complications. Symptoms are stable. There are no diabetic complications. Current diabetic treatment includes oral agent (dual therapy). She is compliant with treatment most of the time. Her weight is stable. She is following a generally unhealthy diet. When asked about meal planning, she reported none. She participates in exercise intermittently. Her breakfast blood glucose range is generally 90-110 mg/dl. Her lunch blood glucose range is generally 110-130 mg/dl. Her dinner blood glucose range is generally 140-180 mg/dl. Her highest blood glucose is >200 mg/dl. Her overall blood glucose range is 130-140 mg/dl. An ACE inhibitor/angiotensin II receptor blocker is being taken. She does not see a podiatrist.Eye exam is current.      Review of Systems  Constitutional: Negative for fever, chills, weight loss, diaphoresis, activity change, appetite change, fatigue and unexpected weight change.  HENT: Negative.   Eyes: Negative.  Negative for blurred vision.  Respiratory: Negative for cough, chest tightness, shortness of breath, wheezing and stridor.   Cardiovascular: Negative for chest pain, palpitations and leg swelling.  Gastrointestinal: Negative for nausea, vomiting, abdominal pain, diarrhea, constipation and blood in stool.  Genitourinary: Negative.  Negative for polyuria.    Musculoskeletal: Negative for myalgias, back pain, joint swelling, arthralgias and gait problem.  Skin: Negative for pallor, rash and wound.  Neurological: Negative for dizziness, tremors, seizures, syncope, facial asymmetry, speech difficulty, weakness, light-headedness, numbness and headaches.  Hematological: Negative for polydipsia, polyphagia and adenopathy. Does not bruise/bleed easily.  Psychiatric/Behavioral: Negative.        Objective:   Physical Exam  Vitals reviewed. Constitutional: She is oriented to person, place, and time. She appears well-developed and well-nourished. No distress.  HENT:  Head: Normocephalic and atraumatic.  Mouth/Throat: Oropharynx is clear and moist. No oropharyngeal exudate.  Eyes: Conjunctivae are normal. Right eye exhibits no discharge. Left eye exhibits no discharge. No scleral icterus.  Neck: Normal range of motion. Neck supple. No JVD present. No tracheal deviation present. No thyromegaly present.  Cardiovascular: Normal rate, regular rhythm, normal heart sounds and intact distal pulses.  Exam reveals no gallop and no friction rub.   No murmur heard. Pulmonary/Chest: Effort normal and breath sounds normal. No stridor. No respiratory distress. She has no wheezes. She has no rales. She exhibits no tenderness.  Abdominal: Soft. Bowel sounds are normal. She exhibits no distension and no mass. There is no tenderness. There is no rebound and no guarding.  Musculoskeletal: Normal range of motion. She exhibits no edema and no tenderness.  Lymphadenopathy:    She has no cervical adenopathy.  Neurological: She is oriented to person, place, and time.  Skin: Skin is warm and dry. No rash noted. She is not diaphoretic. No erythema. No pallor.  Psychiatric: She has a normal mood and affect. Her behavior is normal. Judgment and thought content normal.      Lab Results  Component Value Date   WBC 6.5 09/21/2011   HGB 12.1 09/21/2011   HCT  37.1 09/21/2011    PLT 258.0 09/21/2011   GLUCOSE 136* 09/21/2011   CHOL 151 09/21/2011   TRIG 98.0 09/21/2011   HDL 55.10 09/21/2011   LDLDIRECT 160.2 03/01/2010   LDLCALC 76 09/21/2011   ALT 19 09/21/2011   AST 19 09/21/2011   NA 140 09/21/2011   K 4.4 09/21/2011   CL 105 09/21/2011   CREATININE 0.8 09/21/2011   BUN 15 09/21/2011   CO2 27 09/21/2011   TSH 0.32* 09/21/2011   HGBA1C 7.1* 09/21/2011   MICROALBUR 3.5* 03/01/2010      Assessment & Plan:

## 2011-12-22 ENCOUNTER — Encounter: Payer: Self-pay | Admitting: Internal Medicine

## 2011-12-23 LAB — CULTURE, URINE COMPREHENSIVE: Colony Count: >=100000

## 2011-12-24 ENCOUNTER — Encounter: Payer: Self-pay | Admitting: Internal Medicine

## 2011-12-24 NOTE — Assessment & Plan Note (Signed)
She does not have any s/s of thyroid disease today, I will recheck her TFT's and will advise further if needed

## 2011-12-24 NOTE — Assessment & Plan Note (Signed)
Her BP is well controlled 

## 2011-12-24 NOTE — Assessment & Plan Note (Signed)
I will recheck her UA and urine function and will advise further if needed

## 2011-12-24 NOTE — Assessment & Plan Note (Signed)
She is doing well on vytorin 

## 2011-12-24 NOTE — Assessment & Plan Note (Signed)
She has stopped taking [place] due to some nausea, I will check her a1c today and will monitor her renal function

## 2012-01-22 ENCOUNTER — Telehealth (INDEPENDENT_AMBULATORY_CARE_PROVIDER_SITE_OTHER): Payer: Self-pay | Admitting: Surgery

## 2012-01-22 NOTE — Telephone Encounter (Signed)
Left a voicemail advising the patient to contact [facility] @ [phone] to schedule a follow-up appointment...cef °

## 2012-01-24 ENCOUNTER — Telehealth (INDEPENDENT_AMBULATORY_CARE_PROVIDER_SITE_OTHER): Payer: Self-pay | Admitting: Surgery

## 2012-04-03 ENCOUNTER — Other Ambulatory Visit (INDEPENDENT_AMBULATORY_CARE_PROVIDER_SITE_OTHER): Payer: Managed Care, Other (non HMO)

## 2012-04-03 ENCOUNTER — Encounter: Payer: Self-pay | Admitting: Internal Medicine

## 2012-04-03 ENCOUNTER — Ambulatory Visit (INDEPENDENT_AMBULATORY_CARE_PROVIDER_SITE_OTHER): Payer: Managed Care, Other (non HMO) | Admitting: Internal Medicine

## 2012-04-03 VITALS — BP 112/72 | HR 55 | Temp 96.7°F | Resp 16 | Ht 66.0 in | Wt 168.5 lb

## 2012-04-03 DIAGNOSIS — I10 Essential (primary) hypertension: Secondary | ICD-10-CM

## 2012-04-03 DIAGNOSIS — IMO0001 Reserved for inherently not codable concepts without codable children: Secondary | ICD-10-CM

## 2012-04-03 DIAGNOSIS — E0590 Thyrotoxicosis, unspecified without thyrotoxic crisis or storm: Secondary | ICD-10-CM

## 2012-04-03 DIAGNOSIS — Z23 Encounter for immunization: Secondary | ICD-10-CM

## 2012-04-03 DIAGNOSIS — E785 Hyperlipidemia, unspecified: Secondary | ICD-10-CM

## 2012-04-03 DIAGNOSIS — N390 Urinary tract infection, site not specified: Secondary | ICD-10-CM

## 2012-04-03 LAB — BASIC METABOLIC PANEL
Calcium: 9.5 mg/dL (ref 8.4–10.5)
GFR: 90.8 mL/min (ref >60.00)
Sodium: 140 mEq/L (ref 135–145)

## 2012-04-03 LAB — LIPID PANEL
Cholesterol: 146 mg/dL (ref 0–200)
Triglycerides: 107 mg/dL (ref 0.0–149.0)

## 2012-04-03 LAB — URINALYSIS, ROUTINE W REFLEX MICROSCOPIC
Bilirubin Urine: NEGATIVE
Ketones, ur: NEGATIVE
pH: 5.5 (ref 5.0–8.0)

## 2012-04-03 LAB — CK: Total CK: 58 U/L (ref 7–177)

## 2012-04-03 LAB — HEMOGLOBIN A1C: Hgb A1c MFr Bld: 7.6 % — ABNORMAL HIGH (ref 4.6–6.5)

## 2012-04-03 LAB — TSH: TSH: 0.23 u[IU]/mL — ABNORMAL LOW (ref 0.35–5.50)

## 2012-04-03 MED ORDER — METFORMIN HCL 1000 MG PO TABS: 1000.0000 mg | ORAL_TABLET | Freq: Two times a day (BID) | ORAL | Status: DC

## 2012-04-03 MED ORDER — CANAGLIFLOZIN 100 MG PO TABS: 1.0000 | ORAL_TABLET | Freq: Every day | ORAL | Status: DC

## 2012-04-03 NOTE — Patient Instructions (Signed)

## 2012-04-03 NOTE — Assessment & Plan Note (Signed)
Her BP is well controlled Will check her lytes and renal function 

## 2012-04-03 NOTE — Assessment & Plan Note (Signed)
She has no s/s Will recheck her TSH today

## 2012-04-03 NOTE — Assessment & Plan Note (Signed)
Her a1c has been too high She will start invokana to lower her BS

## 2012-04-03 NOTE — Progress Notes (Signed)
Subjective:    Patient ID: Theresa Harrington, female    DOB: 1955/06/07, 56 y.o.   MRN: [id number]  Diabetes She presents for her follow-up diabetic visit. She has type 2 diabetes mellitus. Her disease course has been worsening. There are no hypoglycemic associated symptoms. Pertinent negatives for hypoglycemia include no pallor. Associated symptoms include fatigue, polydipsia, polyphagia and polyuria. Pertinent negatives for diabetes include no blurred vision, no chest pain, no foot paresthesias, no foot ulcerations, no visual change, no weakness and no weight loss. There are no hypoglycemic complications. Symptoms are worsening. Diabetic complications include PVD. Current diabetic treatment includes oral agent (dual therapy). She is compliant with treatment all of the time. Her weight is stable. She is following a generally healthy diet. Meal planning includes avoidance of concentrated sweets. She has not had a previous visit with a dietician. She participates in exercise intermittently. Her home blood glucose trend is increasing steadily. Her breakfast blood glucose range is generally 110-130 mg/dl. Her dinner blood glucose range is generally 140-180 mg/dl. Her highest blood glucose is 140-180 mg/dl. Her overall blood glucose range is 140-180 mg/dl. An ACE inhibitor/angiotensin II receptor blocker is being taken. She does not see a podiatrist.Eye exam is current.      Review of Systems  Constitutional: Positive for fatigue. Negative for fever, chills, weight loss, diaphoresis, activity change, appetite change and unexpected weight change.  HENT: Negative.   Eyes: Negative.  Negative for blurred vision.  Respiratory: Negative for cough, chest tightness, shortness of breath, wheezing and stridor.   Cardiovascular: Negative for chest pain, palpitations and leg swelling.  Gastrointestinal: Negative for nausea, vomiting, abdominal pain, diarrhea, constipation and anal bleeding.  Genitourinary: Positive for  polyuria. Negative for dysuria, urgency, frequency, hematuria, flank pain, decreased urine volume, enuresis and difficulty urinating.  Musculoskeletal: Negative for myalgias, back pain, joint swelling, arthralgias and gait problem.  Skin: Negative for color change, pallor, rash and wound.  Neurological: Negative.  Negative for weakness.  Hematological: Positive for polydipsia and polyphagia. Negative for adenopathy. Does not bruise/bleed easily.  Psychiatric/Behavioral: Negative.        Objective:   Physical Exam  Vitals reviewed. Constitutional: She is oriented to person, place, and time. She appears well-developed and well-nourished.  Non-toxic appearance. She does not have a sickly appearance. She does not appear ill. No distress.  HENT:  Head: Normocephalic and atraumatic.  Mouth/Throat: Oropharynx is clear and moist. No oropharyngeal exudate.  Eyes: Conjunctivae normal are normal. Right eye exhibits no discharge. Left eye exhibits no discharge. No scleral icterus.  Neck: Normal range of motion. Neck supple. No JVD present. No tracheal deviation present. No thyromegaly present.  Cardiovascular: Normal rate, regular rhythm, normal heart sounds and intact distal pulses.  Exam reveals no gallop and no friction rub.   No murmur heard. Pulmonary/Chest: Effort normal and breath sounds normal. No stridor. No respiratory distress. She has no wheezes. She has no rales. She exhibits no tenderness.  Abdominal: Soft. Bowel sounds are normal. She exhibits no distension and no mass. There is no tenderness. There is no rebound and no guarding.  Musculoskeletal: Normal range of motion. She exhibits no edema and no tenderness.  Lymphadenopathy:    She has no cervical adenopathy.  Neurological: She is oriented to person, place, and time.  Skin: Skin is warm and dry. No rash noted. She is not diaphoretic. No erythema. No pallor.  Psychiatric: She has a normal mood and affect. Her behavior is normal.  Judgment and thought content normal.  Lab Results  Component Value Date   WBC 7.2 12/21/2011   HGB 11.8* 12/21/2011   HCT 35.8* 12/21/2011   PLT 217.0 12/21/2011   GLUCOSE 115* 12/21/2011   CHOL 151 09/21/2011   TRIG 98.0 09/21/2011   HDL 55.10 09/21/2011   LDLDIRECT 160.2 03/01/2010   LDLCALC 76 09/21/2011   ALT 15 12/21/2011   AST 17 12/21/2011   NA 139 12/21/2011   K 4.1 12/21/2011   CL 105 12/21/2011   CREATININE 0.5 12/21/2011   BUN 14 12/21/2011   CO2 27 12/21/2011   TSH 0.19* 12/21/2011   HGBA1C 7.6* 12/21/2011   MICROALBUR 3.5* 03/01/2010      Assessment & Plan:

## 2012-04-03 NOTE — Assessment & Plan Note (Signed)
Recheck her UA and Ur clx and will treat if needed

## 2012-04-04 ENCOUNTER — Encounter: Payer: Self-pay | Admitting: Internal Medicine

## 2012-04-05 ENCOUNTER — Encounter: Payer: Self-pay | Admitting: Internal Medicine

## 2012-04-06 LAB — CULTURE, URINE COMPREHENSIVE

## 2012-04-26 ENCOUNTER — Telehealth: Payer: Self-pay | Admitting: Internal Medicine

## 2012-04-26 ENCOUNTER — Other Ambulatory Visit: Payer: Self-pay | Admitting: Internal Medicine

## 2012-04-26 DIAGNOSIS — E785 Hyperlipidemia, unspecified: Secondary | ICD-10-CM

## 2012-04-26 DIAGNOSIS — I10 Essential (primary) hypertension: Secondary | ICD-10-CM

## 2012-04-26 DIAGNOSIS — E119 Type 2 diabetes mellitus without complications: Secondary | ICD-10-CM

## 2012-04-26 MED ORDER — LOSARTAN POTASSIUM 100 MG PO TABS: 100.0000 mg | ORAL_TABLET | Freq: Every day | ORAL | Status: DC

## 2012-04-26 MED ORDER — EZETIMIBE-SIMVASTATIN 10-40 MG PO TABS: 1.0000 | ORAL_TABLET | Freq: Every day | ORAL | Status: DC

## 2012-04-26 NOTE — Telephone Encounter (Signed)
Patient can not afford the blood pressure medication she is currently taking and would like something cheaper called in, she also needs a refill on her Cholesterol medication

## 2012-04-26 NOTE — Telephone Encounter (Signed)
[place], I switched the divan to losartan daily. I refilled her cholesterol meds. She should come in for a visit in 1 month to have her blood pressure checked. Rene Kocher

## 2012-04-26 NOTE — Telephone Encounter (Signed)
Left detailed message informing patient of recommendations

## 2012-04-29 ENCOUNTER — Other Ambulatory Visit: Payer: Self-pay | Admitting: Internal Medicine

## 2012-04-29 ENCOUNTER — Telehealth: Payer: Self-pay | Admitting: *Deleted

## 2012-04-29 DIAGNOSIS — Z9889 Other specified postprocedural states: Secondary | ICD-10-CM

## 2012-04-29 DIAGNOSIS — E785 Hyperlipidemia, unspecified: Secondary | ICD-10-CM

## 2012-04-29 DIAGNOSIS — Z1231 Encounter for screening mammogram for malignant neoplasm of breast: Secondary | ICD-10-CM

## 2012-04-29 MED ORDER — ATORVASTATIN CALCIUM 80 MG PO TABS: 80.0000 mg | ORAL_TABLET | Freq: Every day | ORAL | Status: DC

## 2012-04-29 NOTE — Telephone Encounter (Signed)
Patient called to inform of medication Vytorin copay is $100.00. Patient states she can not afford this. Please advise in change of medication.

## 2012-04-29 NOTE — Telephone Encounter (Signed)
changed

## 2012-04-29 NOTE — Telephone Encounter (Signed)
Left message on patient home number medication per Dr. Yetta Barre has been changed

## 2012-05-14 ENCOUNTER — Encounter: Payer: Self-pay | Admitting: Internal Medicine

## 2012-05-24 ENCOUNTER — Other Ambulatory Visit: Payer: Self-pay | Admitting: Internal Medicine

## 2012-05-29 ENCOUNTER — Ambulatory Visit: Payer: Managed Care, Other (non HMO)

## 2012-05-29 ENCOUNTER — Other Ambulatory Visit: Payer: Self-pay | Admitting: *Deleted

## 2012-05-29 DIAGNOSIS — I10 Essential (primary) hypertension: Secondary | ICD-10-CM

## 2012-05-29 MED ORDER — LOSARTAN POTASSIUM 100 MG PO TABS: 100.0000 mg | ORAL_TABLET | Freq: Every day | ORAL | Status: DC

## 2012-05-29 NOTE — Telephone Encounter (Signed)
R'cd fax from [facility] for refill of Losartan.

## 2012-05-31 ENCOUNTER — Encounter: Payer: Self-pay | Admitting: Internal Medicine

## 2012-05-31 ENCOUNTER — Ambulatory Visit (INDEPENDENT_AMBULATORY_CARE_PROVIDER_SITE_OTHER): Payer: Managed Care, Other (non HMO) | Admitting: Internal Medicine

## 2012-05-31 VITALS — BP 124/64 | HR 60 | Temp 97.1°F | Resp 16 | Wt 165.0 lb

## 2012-05-31 DIAGNOSIS — E0590 Thyrotoxicosis, unspecified without thyrotoxic crisis or storm: Secondary | ICD-10-CM

## 2012-05-31 DIAGNOSIS — I10 Essential (primary) hypertension: Secondary | ICD-10-CM

## 2012-05-31 NOTE — Progress Notes (Signed)
Subjective:    Patient ID: Theresa Harrington, female    DOB: 10-25-1955, 57 y.o.   MRN: [id number]  Diabetes She presents for her follow-up diabetic visit. She has type 2 diabetes mellitus. Her disease course has been stable. There are no hypoglycemic associated symptoms. Pertinent negatives for hypoglycemia include no dizziness, pallor or tremors. Pertinent negatives for diabetes include no blurred vision, no chest pain, no fatigue, no foot paresthesias, no foot ulcerations, no polydipsia, no polyphagia, no polyuria, no visual change, no weakness and no weight loss. There are no hypoglycemic complications. There are no diabetic complications. Current diabetic treatment includes oral agent (triple therapy). She is compliant with treatment some of the time. Her weight is stable. She is following a generally healthy diet. Meal planning includes avoidance of concentrated sweets. There is no change in her home blood glucose trend. An ACE inhibitor/angiotensin II receptor blocker is being taken. She does not see a podiatrist.Eye exam is current.      Review of Systems  Constitutional: Negative for fever, chills, weight loss, diaphoresis, activity change, appetite change, fatigue and unexpected weight change.  HENT: Negative.   Eyes: Negative.  Negative for blurred vision.  Respiratory: Negative for chest tightness, shortness of breath, wheezing and stridor.   Cardiovascular: Negative for chest pain, palpitations and leg swelling.  Gastrointestinal: Negative for nausea, vomiting, abdominal pain, diarrhea and constipation.  Genitourinary: Negative.  Negative for dysuria, urgency, polyuria, frequency, hematuria, flank pain, decreased urine volume and difficulty urinating.  Musculoskeletal: Negative.  Negative for myalgias and arthralgias.  Skin: Negative for color change, pallor, rash and wound.  Neurological: Negative for dizziness, tremors, syncope, weakness and light-headedness.  Hematological: Negative  for polydipsia, polyphagia and adenopathy. Does not bruise/bleed easily.  Psychiatric/Behavioral: Negative.        Objective:   Physical Exam  Vitals reviewed. Constitutional: She is oriented to person, place, and time. She appears well-developed and well-nourished. No distress.  HENT:  Head: Normocephalic and atraumatic.  Mouth/Throat: Oropharynx is clear and moist. No oropharyngeal exudate.  Eyes: Conjunctivae normal are normal. Right eye exhibits no discharge. Left eye exhibits no discharge. No scleral icterus.  Neck: Normal range of motion. Neck supple. No JVD present. No tracheal deviation present. No thyromegaly present.  Cardiovascular: Normal rate, regular rhythm, normal heart sounds and intact distal pulses.  Exam reveals no gallop and no friction rub.   No murmur heard. Pulmonary/Chest: Effort normal and breath sounds normal. No stridor. No respiratory distress. She has no wheezes. She has no rales. She exhibits no tenderness.  Abdominal: Soft. Bowel sounds are normal. She exhibits no distension and no mass. There is no tenderness. There is no rebound and no guarding.  Musculoskeletal: Normal range of motion. She exhibits no edema and no tenderness.  Lymphadenopathy:    She has no cervical adenopathy.  Neurological: She is oriented to person, place, and time.  Skin: Skin is warm and dry. No rash noted. She is not diaphoretic. No erythema. No pallor.  Psychiatric: She has a normal mood and affect. Her behavior is normal. Judgment and thought content normal.      Lab Results  Component Value Date   WBC 7.2 12/21/2011   HGB 11.8* 12/21/2011   HCT 35.8* 12/21/2011   PLT 217.0 12/21/2011   GLUCOSE 140* 04/03/2012   CHOL 146 04/03/2012   TRIG 107.0 04/03/2012   HDL 53.60 04/03/2012   LDLDIRECT 160.2 03/01/2010   LDLCALC 71 04/03/2012   ALT 15 12/21/2011   AST 17  12/21/2011   NA 140 04/03/2012   K 4.3 04/03/2012   CL 104 04/03/2012   CREATININE 0.7 04/03/2012   BUN 17 04/03/2012    CO2 28 04/03/2012   TSH 0.23* 04/03/2012   HGBA1C 7.6* 04/03/2012   MICROALBUR 3.5* 03/01/2010      Assessment & Plan:

## 2012-05-31 NOTE — Assessment & Plan Note (Signed)
Her BP is well controlled 

## 2012-05-31 NOTE — Assessment & Plan Note (Signed)
She will start invokana

## 2012-05-31 NOTE — Patient Instructions (Addendum)

## 2012-05-31 NOTE — Assessment & Plan Note (Signed)
She appears to be euthyroid

## 2012-06-15 ENCOUNTER — Other Ambulatory Visit: Payer: Self-pay | Admitting: Internal Medicine

## 2012-06-18 ENCOUNTER — Ambulatory Visit
Admission: RE | Admit: 2012-06-18 | Discharge: 2012-06-18 | Disposition: A | Discharge status: Home / Self Care | Payer: PRIVATE HEALTH INSURANCE | Source: Ambulatory Visit | Attending: Internal Medicine | Admitting: Internal Medicine

## 2012-06-18 DIAGNOSIS — Z1231 Encounter for screening mammogram for malignant neoplasm of breast: Secondary | ICD-10-CM

## 2012-06-18 DIAGNOSIS — Z9889 Other specified postprocedural states: Secondary | ICD-10-CM

## 2012-06-24 ENCOUNTER — Encounter: Payer: Managed Care, Other (non HMO) | Admitting: Internal Medicine

## 2012-07-04 ENCOUNTER — Ambulatory Visit (INDEPENDENT_AMBULATORY_CARE_PROVIDER_SITE_OTHER): Payer: PRIVATE HEALTH INSURANCE | Admitting: Internal Medicine

## 2012-07-04 ENCOUNTER — Encounter: Payer: Self-pay | Admitting: Internal Medicine

## 2012-07-04 VITALS — BP 120/80 | HR 67 | Temp 97.5°F | Resp 16 | Wt 164.8 lb

## 2012-07-04 DIAGNOSIS — M5430 Sciatica, unspecified side: Secondary | ICD-10-CM

## 2012-07-04 DIAGNOSIS — M79605 Pain in left leg: Secondary | ICD-10-CM | POA: Insufficient documentation

## 2012-07-04 DIAGNOSIS — I739 Peripheral vascular disease, unspecified: Secondary | ICD-10-CM

## 2012-07-04 DIAGNOSIS — J309 Allergic rhinitis, unspecified: Secondary | ICD-10-CM | POA: Insufficient documentation

## 2012-07-04 DIAGNOSIS — I10 Essential (primary) hypertension: Secondary | ICD-10-CM

## 2012-07-04 DIAGNOSIS — M5432 Sciatica, left side: Secondary | ICD-10-CM

## 2012-07-04 DIAGNOSIS — I70219 Atherosclerosis of native arteries of extremities with intermittent claudication, unspecified extremity: Secondary | ICD-10-CM

## 2012-07-04 DIAGNOSIS — M545 Low back pain: Secondary | ICD-10-CM | POA: Insufficient documentation

## 2012-07-04 MED ORDER — METHYLPREDNISOLONE ACETATE 40 MG/ML IJ SUSP
40.0000 mg | Freq: Once | INTRAMUSCULAR | Status: AC
  Administered 2012-07-04: 40 mg via INTRAMUSCULAR

## 2012-07-04 MED ORDER — MOMETASONE FUROATE 50 MCG/ACT NA SUSP: 2.0000 | Freq: Every day | NASAL | Status: DC

## 2012-07-04 MED ORDER — CILOSTAZOL 100 MG PO TABS: 100.0000 mg | ORAL_TABLET | Freq: Two times a day (BID) | ORAL | Status: DC

## 2012-07-04 MED ORDER — CETIRIZINE HCL 10 MG PO TABS: 10.0000 mg | ORAL_TABLET | Freq: Every day | ORAL | Status: DC

## 2012-07-04 MED ORDER — METHYLPREDNISOLONE ACETATE 80 MG/ML IJ SUSP
80.0000 mg | Freq: Once | INTRAMUSCULAR | Status: AC
  Administered 2012-07-04: 80 mg via INTRAMUSCULAR

## 2012-07-04 NOTE — Assessment & Plan Note (Signed)
Her BP is well controlled 

## 2012-07-04 NOTE — Progress Notes (Signed)
Subjective:    Patient ID: Theresa Harrington, female    DOB: December 08, 1955, 57 y.o.   MRN: [id number]  Sinusitis This is a new problem. The current episode started in the past 7 days. The problem has been rapidly improving since onset. There has been no fever. The fever has been present for less than 1 day. Her pain is at a severity of 0/10. She is experiencing no pain. Associated symptoms include congestion and sneezing. Pertinent negatives include no chills, coughing, diaphoresis, ear pain, headaches, hoarse voice, neck pain, shortness of breath, sinus pressure, sore throat or swollen glands. Past treatments include antibiotics and spray decongestants. The treatment provided moderate relief.      Review of Systems  Constitutional: Negative for fever, chills, diaphoresis, activity change, appetite change, fatigue and unexpected weight change.  HENT: Positive for congestion, rhinorrhea, sneezing and postnasal drip. Negative for ear pain, nosebleeds, sore throat, hoarse voice, facial swelling, trouble swallowing, neck pain, dental problem, voice change, sinus pressure and tinnitus.   Eyes: Negative.   Respiratory: Negative for cough, choking, shortness of breath, wheezing and stridor.   Cardiovascular: Negative for chest pain, palpitations and leg swelling.  Gastrointestinal: Negative for nausea, vomiting, abdominal pain, diarrhea, constipation and blood in stool.  Endocrine: Negative.   Genitourinary: Negative.   Musculoskeletal: Positive for arthralgias (chronic pain in left lower buttocks). Negative for myalgias, back pain, joint swelling and gait problem.  Skin: Negative.   Allergic/Immunologic: Negative.   Neurological: Negative.  Negative for dizziness, weakness, light-headedness and headaches.  Hematological: Negative for adenopathy. Does not bruise/bleed easily.  Psychiatric/Behavioral: Negative.        Objective:   Physical Exam  Vitals reviewed. Constitutional: She is oriented to  person, place, and time. She appears well-developed and well-nourished.  Non-toxic appearance. She does not have a sickly appearance. She does not appear ill. No distress.  HENT:  Head: Normocephalic and atraumatic. No trismus in the jaw.  Right Ear: Hearing, tympanic membrane, external ear and ear canal normal.  Left Ear: Hearing, tympanic membrane, external ear and ear canal normal.  Nose: Mucosal edema present. No rhinorrhea, sinus tenderness or septal deviation. No epistaxis.  No foreign bodies. Right sinus exhibits no maxillary sinus tenderness and no frontal sinus tenderness. Left sinus exhibits no maxillary sinus tenderness and no frontal sinus tenderness.  Mouth/Throat: Oropharynx is clear and moist and mucous membranes are normal. Mucous membranes are not pale, not dry and not cyanotic. No oral lesions. No edematous. No oropharyngeal exudate, posterior oropharyngeal edema, posterior oropharyngeal erythema or tonsillar abscesses.  Eyes: Conjunctivae are normal. Right eye exhibits no discharge. Left eye exhibits no discharge. No scleral icterus.  Neck: Normal range of motion. Neck supple. No JVD present. No tracheal deviation present. No thyromegaly present.  Cardiovascular: Normal rate, regular rhythm, normal heart sounds and intact distal pulses.  Exam reveals no gallop and no friction rub.   No murmur heard. Pulses:      Carotid pulses are 1+ on the right side, and 1+ on the left side.      Radial pulses are 1+ on the right side, and 1+ on the left side.       Femoral pulses are 1+ on the right side, and 1+ on the left side.      Popliteal pulses are 0 on the right side, and 0 on the left side.       Dorsalis pedis pulses are 0 on the right side, and 0 on the left side.  Posterior tibial pulses are 0 on the right side, and 0 on the left side.  Pulmonary/Chest: Effort normal and breath sounds normal. No stridor. No respiratory distress. She has no wheezes. She has no rales. She  exhibits no tenderness.  Abdominal: Soft. Bowel sounds are normal. She exhibits no distension and no mass. There is no tenderness. There is no rebound and no guarding.  Musculoskeletal: Normal range of motion. She exhibits no edema and no tenderness.       Left hip: Normal. She exhibits normal range of motion, normal strength, no tenderness, no bony tenderness, no swelling, no crepitus, no deformity and no laceration.  Lymphadenopathy:    She has no cervical adenopathy.  Neurological: She is oriented to person, place, and time.  Skin: Skin is warm and dry. No rash noted. She is not diaphoretic. No erythema. No pallor.  Psychiatric: She has a normal mood and affect. Her behavior is normal. Judgment and thought content normal.     Lab Results  Component Value Date   WBC 7.2 12/21/2011   HGB 11.8* 12/21/2011   HCT 35.8* 12/21/2011   PLT 217.0 12/21/2011   GLUCOSE 140* 04/03/2012   CHOL 146 04/03/2012   TRIG 107.0 04/03/2012   HDL 53.60 04/03/2012   LDLDIRECT 160.2 03/01/2010   LDLCALC 71 04/03/2012   ALT 15 12/21/2011   AST 17 12/21/2011   NA 140 04/03/2012   K 4.3 04/03/2012   CL 104 04/03/2012   CREATININE 0.7 04/03/2012   BUN 17 04/03/2012   CO2 28 04/03/2012   TSH 0.23* 04/03/2012   HGBA1C 7.6* 04/03/2012   MICROALBUR 3.5* 03/01/2010       Assessment & Plan:

## 2012-07-04 NOTE — Assessment & Plan Note (Signed)
She wants to see if an injection will help her pain ------> pain referral

## 2012-07-04 NOTE — Assessment & Plan Note (Signed)
Restart pletal She is due for a vascular surgery f/up

## 2012-07-04 NOTE — Assessment & Plan Note (Signed)
She is having a severe flare of symptoms so I gave her an injection of depo-medrol IM. She will start zyrtec and nasonex as well.

## 2012-07-04 NOTE — Patient Instructions (Signed)
Allergic Rhinitis  Allergic rhinitis is when the mucous membranes in the nose respond to allergens. Allergens are particles in the air that cause your body to have an allergic reaction. This causes you to release allergic antibodies. Through a chain of events, these eventually cause you to release histamine into the blood stream (hence the use of antihistamines). Although meant to be protective to the body, it is this release that causes your discomfort, such as frequent sneezing, congestion and an itchy runny nose.    CAUSES    The pollen allergens may come from grasses, trees, and weeds. This is seasonal allergic rhinitis, or "hay fever." Other allergens cause year-round allergic rhinitis (perennial allergic rhinitis) such as house dust mite allergen, pet dander and mold spores.    SYMPTOMS     Nasal stuffiness (congestion).   Runny, itchy nose with sneezing and tearing of the eyes.   There is often an itching of the mouth, eyes and ears.  It cannot be cured, but it can be controlled with medications.  DIAGNOSIS    If you are unable to determine the offending allergen, skin or blood testing may find it.  TREATMENT     Avoid the allergen.   Medications and allergy shots (immunotherapy) can help.   Hay fever may often be treated with antihistamines in pill or nasal spray forms. Antihistamines block the effects of histamine. There are over-the-counter medicines that may help with nasal congestion and swelling around the eyes. Check with your caregiver before taking or giving this medicine.  If the treatment above does not work, there are many new medications your caregiver can prescribe. Stronger medications may be used if initial measures are ineffective. Desensitizing injections can be used if medications and avoidance fails. Desensitization is when a patient is given ongoing shots until the body becomes less sensitive to the allergen. Make sure you follow up with your caregiver if problems continue.   SEEK MEDICAL CARE IF:     You develop fever (more than 100.5 F (38.1 C).   You develop a cough that does not stop easily (persistent).   You have shortness of breath.   You start wheezing.   Symptoms interfere with normal daily activities.  Document Released: 01/10/2001 Document Revised: 07/10/2011 Document Reviewed: 07/22/2008  [employer] Patient Information 2013 [employer]

## 2012-07-08 ENCOUNTER — Encounter (INDEPENDENT_AMBULATORY_CARE_PROVIDER_SITE_OTHER): Payer: PRIVATE HEALTH INSURANCE | Admitting: *Deleted

## 2012-07-08 ENCOUNTER — Other Ambulatory Visit: Payer: Self-pay | Admitting: *Deleted

## 2012-07-08 ENCOUNTER — Encounter: Payer: Self-pay | Admitting: Surgery

## 2012-07-08 ENCOUNTER — Ambulatory Visit (INDEPENDENT_AMBULATORY_CARE_PROVIDER_SITE_OTHER): Payer: PRIVATE HEALTH INSURANCE | Admitting: Surgery

## 2012-07-08 VITALS — BP 130/83 | HR 61 | Resp 16 | Ht 67.0 in | Wt 162.0 lb

## 2012-07-08 DIAGNOSIS — I739 Peripheral vascular disease, unspecified: Secondary | ICD-10-CM

## 2012-07-08 NOTE — Progress Notes (Signed)
[facility]   Patient name: Theresa Harrington MRN: [id number] DOB: 1956-03-29 Sex: female     Chief Complaint  Patient presents with  . PVD    one year f/up with vascular lab study.    HISTORY OF PRESENT ILLNESS: The patient is back today for followup of her claudication. Her left leg remains the more symptomatic leg, however she does not have problems on a daily basis. She states that some days she can walk a long ways without any problems, and other days she cannot walk very far. 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 She can get to her workday without significant trouble. She denies rest pain or ulceration. She does complain of cramping in her calf which occurs without activity. She continues to be medically managed for diabetes and hypercholesterolemia.  Past Medical History  Diagnosis Date  . Anemia   . Hyperlipidemia   . Hypertension   . Hypothyroidism   . Diabetes mellitus     type II  . Peripheral vascular disease     Past Surgical History  Procedure Laterality Date  . Gastric bypass    . Hand surgery      right hand  . Knee surgery      right   . Breast surgery      breast lift/bil  . Thigh lift      Bil  . Gastric banding port revision      2007  . Belpharoptosis repair      eyelid lift    History   Social History  . Marital Status: Single    Spouse Name: N/A    Number of Children: N/A  . Years of Education: N/A   Occupational History  . tester    Social History Main Topics  . Smoking status: Former Smoker -- 20 years    Quit date: 09/06/2002  . Smokeless tobacco: Never Used  . Alcohol Use: No  . Drug Use: No  . Sexually Active: Not Currently   Other Topics Concern  . Not on file   Social History Narrative   Regular exercise- yes    Family History  Problem Relation Age of Onset  . Arthritis Other   . Cancer Other     colon  . Hypertension Other   . Stroke  Other   . Diabetes Father   . Heart disease Father   . Diabetes Sister   . Heart disease Sister   . Diabetes Brother   . Heart disease Brother   . Colon cancer Maternal Aunt   . Diabetes Brother   . Diabetes Brother   . Kidney disease Mother     Allergies as of 07/08/2012 - Review Complete 07/08/2012  Allergen Reaction Noted  . Ampicillin Nausea And Vomiting 04/12/2011  . Codeine    . Lisinopril  10/05/2008  . Penicillins Nausea And Vomiting 05/01/2011  . Januvia (sitagliptin)  12/21/2011    Current Outpatient Prescriptions on File Prior to Visit  Medication Sig Dispense Refill  . atorvastatin (LIPITOR) 80 MG tablet Take 1 tablet (80 mg total) by mouth daily.  90 tablet  3  . Canagliflozin (INVOKANA) 100 MG TABS Take 1 tablet by mouth daily.  90 tablet  3  . cetirizine (ZYRTEC) 10 MG tablet Take 1 tablet (10 mg total) by mouth daily.  90 tablet  3  . glyBURIDE (DIABETA) 5 MG  tablet TAKE ONE TABLET BY MOUTH EVERY DAY  90 tablet  3  . losartan (COZAAR) 100 MG tablet TAKE ONE TABLET BY MOUTH EVERY DAY  30 tablet  0  . metFORMIN (GLUCOPHAGE) 1000 MG tablet Take 1 tablet (1,000 mg total) by mouth 2 (two) times daily with a meal.  180 tablet  1  . cilostazol (PLETAL) 100 MG tablet Take 1 tablet (100 mg total) by mouth 2 (two) times daily.  180 tablet  3  . desvenlafaxine (PRISTIQ) 50 MG 24 hr tablet Take 1 tablet (50 mg total) by mouth daily.  30 tablet  11  . mometasone (NASONEX) 50 MCG/ACT nasal spray Place 2 sprays into the nose daily.  17 g  12   No current facility-administered medications on file prior to visit.     REVIEW OF SYSTEMS: Please see history of present illness, otherwise no significant changes from previous visit  PHYSICAL EXAMINATION:   Vital signs are BP 130/83  Pulse 61  Resp 16  Ht 5\' 7"  (1.702 m)  Wt 162 lb (73.483 kg)  BMI 25.37 kg/m2  SpO2 100% General: The patient appears their stated age. HEENT:  No gross abnormalities Pulmonary:  Non labored  breathing Musculoskeletal: There are no major deformities. Neurologic: No focal weakness or paresthesias are detected, Skin: There are no ulcer or rashes noted. Psychiatric: The patient has normal affect. Cardiovascular: There is a regular rate and rhythm without significant murmur appreciated. Pedal pulse on the left is not palpable. No carotid bruits.   Diagnostic Studies ABIs were performed today. The left is 0.6, the right is 0.91.  Assessment: Bilateral claudication, left greater than right Plan: The patient's symptoms have been relatively stable over the course of the past year. She continues to be without daily limitations. I have discussed with her that I would not recommend intervention until her symptoms progressed and she has more significant limitations with her activity. I have prescribed her cilostazol in the past, however she has not taken this. I told her today that since she has gotten by over the past year without a medication, that I would not take it at this time, because it likely will not have a significant benefit since her legs do not bother her on a daily basis. She will continue with medical management of her peripheral vascular disease which includes strict blood pressure, blood glucose, and cholesterol control. She will follow up with me in one year for a repeat evaluation. If her symptoms progressed prior to that, she will be seen sooner.  Jorge Ny, M.D. [facility] [place] Office: [phone] Pager:  [phone]

## 2012-07-08 NOTE — Addendum Note (Signed)
Addended by: Sharee Pimple on: 07/08/2012 01:45 PM   Modules accepted: Orders

## 2012-07-09 ENCOUNTER — Other Ambulatory Visit: Payer: Self-pay | Admitting: Internal Medicine

## 2012-07-09 ENCOUNTER — Telehealth: Payer: Self-pay

## 2012-07-09 DIAGNOSIS — J309 Allergic rhinitis, unspecified: Secondary | ICD-10-CM

## 2012-07-09 MED ORDER — FLUTICASONE PROPIONATE 50 MCG/ACT NA SUSP: 2.0000 | Freq: Every day | NASAL | Status: DC

## 2012-07-09 NOTE — Telephone Encounter (Signed)
done

## 2012-07-09 NOTE — Telephone Encounter (Signed)
Pt called stating that even with coupon card provided for Nasonex Rx, it is still too expensive $83/month. Pt is requesting generic substitute, please advise.

## 2012-07-09 NOTE — Telephone Encounter (Signed)
Pt advised of Rx change 

## 2012-08-06 ENCOUNTER — Other Ambulatory Visit (INDEPENDENT_AMBULATORY_CARE_PROVIDER_SITE_OTHER): Payer: PRIVATE HEALTH INSURANCE

## 2012-08-06 ENCOUNTER — Ambulatory Visit (INDEPENDENT_AMBULATORY_CARE_PROVIDER_SITE_OTHER): Payer: PRIVATE HEALTH INSURANCE | Admitting: Internal Medicine

## 2012-08-06 ENCOUNTER — Encounter: Payer: Self-pay | Admitting: Internal Medicine

## 2012-08-06 VITALS — BP 122/70 | HR 61 | Temp 97.6°F | Ht 67.0 in | Wt 160.0 lb

## 2012-08-06 DIAGNOSIS — K219 Gastro-esophageal reflux disease without esophagitis: Secondary | ICD-10-CM | POA: Insufficient documentation

## 2012-08-06 DIAGNOSIS — I10 Essential (primary) hypertension: Secondary | ICD-10-CM

## 2012-08-06 DIAGNOSIS — B379 Candidiasis, unspecified: Secondary | ICD-10-CM

## 2012-08-06 DIAGNOSIS — B49 Unspecified mycosis: Secondary | ICD-10-CM

## 2012-08-06 DIAGNOSIS — E119 Type 2 diabetes mellitus without complications: Secondary | ICD-10-CM

## 2012-08-06 DIAGNOSIS — E785 Hyperlipidemia, unspecified: Secondary | ICD-10-CM

## 2012-08-06 DIAGNOSIS — N390 Urinary tract infection, site not specified: Secondary | ICD-10-CM

## 2012-08-06 LAB — POCT URINALYSIS DIPSTICK
Ketones, UA: NEGATIVE
Spec Grav, UA: >=1.03
Urobilinogen, UA: 0.2
pH, UA: 6

## 2012-08-06 LAB — LIPID PANEL
HDL: 53.6 mg/dL (ref >39.00)
Total CHOL/HDL Ratio: 3
VLDL: 20.6 mg/dL (ref 0.0–40.0)

## 2012-08-06 LAB — CBC
HCT: 37.5 % (ref 36.0–46.0)
RBC: 4.52 Mil/uL (ref 3.87–5.11)
WBC: 12.3 10*3/uL — ABNORMAL HIGH (ref 4.5–10.5)

## 2012-08-06 LAB — BASIC METABOLIC PANEL
CO2: 22 mEq/L (ref 19–32)
Calcium: 9.4 mg/dL (ref 8.4–10.5)
Glucose, Bld: 124 mg/dL — ABNORMAL HIGH (ref 70–99)
Sodium: 140 mEq/L (ref 135–145)

## 2012-08-06 LAB — HEMOGLOBIN A1C: Hgb A1c MFr Bld: 8 % — ABNORMAL HIGH (ref 4.6–6.5)

## 2012-08-06 MED ORDER — PANTOPRAZOLE SODIUM 40 MG PO TBEC: 40.0000 mg | DELAYED_RELEASE_TABLET | Freq: Every day | ORAL | Status: DC

## 2012-08-06 MED ORDER — FLUCONAZOLE 150 MG PO TABS: 150.0000 mg | ORAL_TABLET | Freq: Once | ORAL | Status: DC

## 2012-08-06 MED ORDER — CIPROFLOXACIN HCL 500 MG PO TABS: 500.0000 mg | ORAL_TABLET | Freq: Two times a day (BID) | ORAL | Status: DC

## 2012-08-06 NOTE — Assessment & Plan Note (Addendum)
Ua positive for infection Will start Cipro BID x 5 days  Will give diflucan for antibiotic induced yeast infection

## 2012-08-06 NOTE — Progress Notes (Signed)
Subjective:    Patient ID: Theresa Harrington, female    DOB: 14-Aug-1955, 57 y.o.   MRN: [id number]  HPI  Pt presents to the clinic today with c/o a UTI. This started 3 days ago with a foul odor with urination and some slight burning. She has had UTI's in the past. She reports this feels the same. She denies back pain, nausea, vomiting, fever or chills. Additionally today, pt complains of heartburn. This started 2 months ago. It is worse after she eats. She has taken pepcid  Twice. She has not taken any tums or rolaids. She has had the lab band surgery and thinks the heartburn may be related to this. She does drink a lot of coffee and sodas. She does not eat any spicy foods. She denies chest pain, chest tightness or shortness of breath. She did have a normal EKG 1 week ago. She would also like her lab work done today.  Review of Systems      Past Medical History  Diagnosis Date  . Anemia   . Hyperlipidemia   . Hypertension   . Hypothyroidism   . Diabetes mellitus     type II  . Peripheral vascular disease     Current Outpatient Prescriptions  Medication Sig Dispense Refill  . atorvastatin (LIPITOR) 80 MG tablet Take 1 tablet (80 mg total) by mouth daily.  90 tablet  3  . Canagliflozin (INVOKANA) 100 MG TABS Take 1 tablet by mouth daily.  90 tablet  3  . cetirizine (ZYRTEC) 10 MG tablet Take 1 tablet (10 mg total) by mouth daily.  90 tablet  3  . glyBURIDE (DIABETA) 5 MG tablet TAKE ONE TABLET BY MOUTH EVERY DAY  90 tablet  0  . losartan (COZAAR) 100 MG tablet TAKE ONE TABLET BY MOUTH EVERY DAY  30 tablet  0  . losartan (COZAAR) 100 MG tablet TAKE ONE TABLET BY MOUTH EVERY DAY  30 tablet  0  . metFORMIN (GLUCOPHAGE) 1000 MG tablet TAKE ONE TABLET BY MOUTH TWICE DAILY WITH A MEAL  180 tablet  0  . cilostazol (PLETAL) 100 MG tablet Take 1 tablet (100 mg total) by mouth 2 (two) times daily.  180 tablet  3  . fluticasone (FLONASE) 50 MCG/ACT nasal spray Place 2 sprays into the nose daily.  16  g  6   No current facility-administered medications for this visit.    Allergies  Allergen Reactions  . Ampicillin Nausea And Vomiting  . Codeine   . Lisinopril     REACTION: cough  . Penicillins Nausea And Vomiting  . Januvia (Sitagliptin)     nausea    Family History  Problem Relation Age of Onset  . Arthritis Other   . Cancer Other     colon  . Hypertension Other   . Stroke Other   . Diabetes Father   . Heart disease Father   . Diabetes Sister   . Heart disease Sister   . Diabetes Brother   . Heart disease Brother   . Colon cancer Maternal Aunt   . Diabetes Brother   . Diabetes Brother   . Kidney disease Mother     History   Social History  . Marital Status: Single    Spouse Name: N/A    Number of Children: N/A  . Years of Education: N/A   Occupational History  . tester    Social History Main Topics  . Smoking status: Former Smoker -- 20 years  Quit date: 09/06/2002  . Smokeless tobacco: Never Used  . Alcohol Use: No  . Drug Use: No  . Sexually Active: Not Currently   Other Topics Concern  . Not on file   Social History Narrative   Regular exercise- yes     Constitutional: Denies fever, malaise, fatigue, headache or abrupt weight changes.  Respiratory: Denies difficulty breathing, shortness of breath, cough or sputum production.   Cardiovascular: Denies chest pain, chest tightness, palpitations or swelling in the hands or feet.  Gastrointestinal: Pt reporrs reflux.Denies abdominal pain, bloating, constipation, diarrhea or blood in the stool.  GU: Pt reports urgency, frequency, pain with urination and a foul odor.. Denies burning sensation, blood in urine, ]or discharge. Neurological: Denies dizziness, difficulty with memory, difficulty with speech or problems with balance and coordination.   No other specific complaints in a complete review of systems (except as listed in HPI above).  Objective:   Physical Exam  BP 122/70  Pulse 61   Temp(Src) 97.6 F (36.4 C) (Oral)  Ht 5\' 7"  (1.702 m)  Wt 160 lb (72.576 kg)  BMI 25.05 kg/m2  SpO2 99% Wt Readings from Last 3 Encounters:  08/06/12 160 lb (72.576 kg)  07/08/12 162 lb (73.483 kg)  07/04/12 164 lb 12 oz (74.73 kg)    General: Appears her stated age, well developed, well nourished in NAD. Cardiovascular: Normal rate and rhythm. S1,S2 noted.  No murmur, rubs or gallops noted. No JVD or BLE edema. No carotid bruits noted. Pulmonary/Chest: Normal effort and positive vesicular breath sounds. No respiratory distress. No wheezes, rales or ronchi noted.  Abdomen: Soft and nontender. Hypoactive bowel sounds, no bruits noted. No distention or masses noted. Liver, spleen and kidneys non palpable. No CVA tenderness.  Neurological: Alert and oriented. Cranial nerves II-XII intact. Coordination normal. +DTRs bilaterally.   BMET    Component Value Date/Time   NA 140 04/03/2012 1030   K 4.3 04/03/2012 1030   CL 104 04/03/2012 1030   CO2 28 04/03/2012 1030   GLUCOSE 140* 04/03/2012 1030   BUN 17 04/03/2012 1030   CREATININE 0.7 04/03/2012 1030   CALCIUM 9.5 04/03/2012 1030   GFRNONAA >60 04/05/2010 0915   GFRAA  Value: >60        The eGFR has been calculated using the MDRD equation. This calculation has not been validated in all clinical situations. eGFR's persistently <60 mL/min signify possible Chronic Kidney Disease. 04/05/2010 0915    Lipid Panel     Component Value Date/Time   CHOL 146 04/03/2012 1030   TRIG 107.0 04/03/2012 1030   HDL 53.60 04/03/2012 1030   CHOLHDL 3 04/03/2012 1030   VLDL 21.4 04/03/2012 1030   LDLCALC 71 04/03/2012 1030    CBC    Component Value Date/Time   WBC 7.2 12/21/2011 1003   RBC 4.34 12/21/2011 1003   HGB 11.8* 12/21/2011 1003   HCT 35.8* 12/21/2011 1003   PLT 217.0 12/21/2011 1003   MCV 82.4 12/21/2011 1003   MCH 27.4 04/05/2010 0915   MCHC 32.9 12/21/2011 1003   RDW 14.9* 12/21/2011 1003   LYMPHSABS 2.6 12/21/2011 1003   MONOABS 0.4 12/21/2011 1003    EOSABS 0.1 12/21/2011 1003   BASOSABS 0.0 12/21/2011 1003    Hgb A1C Lab Results  Component Value Date   HGBA1C 7.6* 04/03/2012         Assessment & Plan:   Lab work obtained to today to evaluate HTN, HLD, DM type 2

## 2012-08-06 NOTE — Patient Instructions (Signed)

## 2012-08-06 NOTE — Assessment & Plan Note (Signed)
Will start protonix 40 mg daily Can take tums/rolaids if needed If issue continues, may need to refer to GI for upper endoscopy

## 2012-08-23 ENCOUNTER — Other Ambulatory Visit: Payer: Self-pay | Admitting: Internal Medicine

## 2012-10-01 ENCOUNTER — Other Ambulatory Visit: Payer: Self-pay | Admitting: Internal Medicine

## 2012-10-07 ENCOUNTER — Encounter: Payer: Self-pay | Admitting: Internal Medicine

## 2012-10-07 ENCOUNTER — Ambulatory Visit (INDEPENDENT_AMBULATORY_CARE_PROVIDER_SITE_OTHER)
Admission: RE | Admit: 2012-10-07 | Discharge: 2012-10-07 | Disposition: A | Discharge status: Home / Self Care | Payer: PRIVATE HEALTH INSURANCE | Source: Ambulatory Visit | Attending: Internal Medicine | Admitting: Internal Medicine

## 2012-10-07 ENCOUNTER — Other Ambulatory Visit (INDEPENDENT_AMBULATORY_CARE_PROVIDER_SITE_OTHER): Payer: PRIVATE HEALTH INSURANCE

## 2012-10-07 ENCOUNTER — Ambulatory Visit (INDEPENDENT_AMBULATORY_CARE_PROVIDER_SITE_OTHER): Payer: PRIVATE HEALTH INSURANCE | Admitting: Internal Medicine

## 2012-10-07 VITALS — BP 136/70 | HR 63 | Temp 97.5°F | Resp 16 | Wt 163.1 lb

## 2012-10-07 DIAGNOSIS — E1159 Type 2 diabetes mellitus with other circulatory complications: Secondary | ICD-10-CM

## 2012-10-07 DIAGNOSIS — E0590 Thyrotoxicosis, unspecified without thyrotoxic crisis or storm: Secondary | ICD-10-CM

## 2012-10-07 DIAGNOSIS — M545 Low back pain, unspecified: Secondary | ICD-10-CM

## 2012-10-07 DIAGNOSIS — F329 Major depressive disorder, single episode, unspecified: Secondary | ICD-10-CM

## 2012-10-07 DIAGNOSIS — I10 Essential (primary) hypertension: Secondary | ICD-10-CM

## 2012-10-07 LAB — COMPREHENSIVE METABOLIC PANEL
ALT: 17 U/L (ref 0–35)
AST: 18 U/L (ref 0–37)
Chloride: 104 mEq/L (ref 96–112)
Creatinine, Ser: 0.6 mg/dL (ref 0.4–1.2)
Total Bilirubin: 0.7 mg/dL (ref 0.3–1.2)

## 2012-10-07 LAB — HEMOGLOBIN A1C: Hgb A1c MFr Bld: 8.1 % — ABNORMAL HIGH (ref 4.6–6.5)

## 2012-10-07 MED ORDER — SERTRALINE HCL 50 MG PO TABS: 50.0000 mg | ORAL_TABLET | Freq: Every day | ORAL | Status: DC

## 2012-10-07 NOTE — Assessment & Plan Note (Signed)
Her BP is well controlled 

## 2012-10-07 NOTE — Progress Notes (Signed)
Subjective:    Patient ID: Theresa Harrington, female    DOB: Jun 06, 1955, 57 y.o.   MRN: [id number]  Back Pain This is a recurrent problem. The current episode started 1 to 4 weeks ago. The problem occurs intermittently. The problem is unchanged. The pain is present in the lumbar spine. The quality of the pain is described as stabbing and aching. The pain radiates to the left thigh and right thigh. The pain is at a severity of 4/10. The pain is mild. The pain is worse during the day. The symptoms are aggravated by standing, bending and position. Pertinent negatives include no abdominal pain, bladder incontinence, bowel incontinence, chest pain, dysuria, fever, headaches, leg pain, numbness, paresis, paresthesias, pelvic pain, perianal numbness, tingling, weakness or weight loss. Risk factors include recent trauma (she had an injury at work a few weeks ago). She has tried nothing for the symptoms. The treatment provided mild relief.      Review of Systems  Constitutional: Negative.  Negative for fever, chills, weight loss, diaphoresis, activity change, appetite change, fatigue and unexpected weight change.  HENT: Negative.   Eyes: Negative.   Respiratory: Negative.  Negative for cough, chest tightness, shortness of breath, wheezing and stridor.   Cardiovascular: Negative.  Negative for chest pain, palpitations and leg swelling.  Gastrointestinal: Negative.  Negative for nausea, vomiting, abdominal pain, diarrhea, constipation and bowel incontinence.  Endocrine: Negative.   Genitourinary: Negative.  Negative for bladder incontinence, dysuria, difficulty urinating and pelvic pain.  Musculoskeletal: Positive for back pain. Negative for myalgias, joint swelling, arthralgias and gait problem.  Skin: Negative.   Allergic/Immunologic: Negative.   Neurological: Negative.  Negative for tingling, tremors, weakness, numbness, headaches and paresthesias.  Hematological: Negative.  Negative for adenopathy. Does  not bruise/bleed easily.  Psychiatric/Behavioral: Positive for sleep disturbance, dysphoric mood and decreased concentration. Negative for suicidal ideas, hallucinations, behavioral problems, confusion, self-injury and agitation. The patient is not nervous/anxious and is not hyperactive.        Crying spells, anhedonia, disturbed sleep, sad mood       Objective:   Physical Exam  Vitals reviewed. Constitutional: She is oriented to person, place, and time. She appears well-developed and well-nourished. No distress.  HENT:  Head: Normocephalic and atraumatic.  Mouth/Throat: Oropharynx is clear and moist. No oropharyngeal exudate.  Eyes: Conjunctivae are normal. Right eye exhibits no discharge. Left eye exhibits no discharge. No scleral icterus.  Neck: Normal range of motion. Neck supple. No JVD present. No tracheal deviation present. No thyromegaly present.  Cardiovascular: Normal rate, regular rhythm, normal heart sounds and intact distal pulses.  Exam reveals no gallop and no friction rub.   No murmur heard. Pulmonary/Chest: Effort normal and breath sounds normal. No stridor. No respiratory distress. She has no wheezes. She has no rales. She exhibits no tenderness.  Abdominal: Soft. Bowel sounds are normal. She exhibits no distension and no mass. There is no tenderness. There is no rebound and no guarding.  Musculoskeletal: Normal range of motion. She exhibits no edema and no tenderness.       Lumbar back: Normal. She exhibits normal range of motion, no tenderness, no bony tenderness, no swelling and no deformity.  Lymphadenopathy:    She has no cervical adenopathy.  Neurological: She is alert and oriented to person, place, and time. She has normal strength. She displays no atrophy, no tremor and normal reflexes. No cranial nerve deficit or sensory deficit. She exhibits normal muscle tone. She displays a negative Romberg sign. She displays  no seizure activity. Coordination and gait normal.   Reflex Scores:      Tricep reflexes are 1+ on the right side and 1+ on the left side.      Bicep reflexes are 1+ on the right side and 1+ on the left side.      Brachioradialis reflexes are 1+ on the right side and 1+ on the left side.      Patellar reflexes are 1+ on the right side and 1+ on the left side.      Achilles reflexes are 1+ on the right side and 1+ on the left side. Neg SLR in BLE  Skin: Skin is warm and dry. No rash noted. She is not diaphoretic. No erythema. No pallor.  Psychiatric: Her speech is normal and behavior is normal. Judgment and thought content normal. Her mood appears not anxious. Her affect is not angry, not blunt, not labile and not inappropriate. Cognition and memory are normal. She exhibits a depressed mood (sad).     Lab Results  Component Value Date   WBC 12.3* 08/06/2012   HGB 12.7 08/06/2012   HCT 37.5 08/06/2012   PLT 264.0 08/06/2012   GLUCOSE 124* 08/06/2012   CHOL 137 08/06/2012   TRIG 103.0 08/06/2012   HDL 53.60 08/06/2012   LDLDIRECT 160.2 03/01/2010   LDLCALC 63 08/06/2012   ALT 15 12/21/2011   AST 17 12/21/2011   NA 140 08/06/2012   K 4.0 08/06/2012   CL 107 08/06/2012   CREATININE 0.7 08/06/2012   BUN 15 08/06/2012   CO2 22 08/06/2012   TSH 0.23* 04/03/2012   HGBA1C 8.0* 08/06/2012   MICROALBUR 3.5* 03/01/2010       Assessment & Plan:

## 2012-10-07 NOTE — Patient Instructions (Signed)
Back Pain, Adult  Low back pain is very common. About 1 in 5 people have back pain. The cause of low back pain is rarely dangerous. The pain often gets better over time. About half of people with a sudden onset of back pain feel better in just 2 weeks. About 8 in 10 people feel better by 6 weeks.   CAUSES  Some common causes of back pain include:  · Strain of the muscles or ligaments supporting the spine.  · Wear and tear (degeneration) of the spinal discs.  · Arthritis.  · Direct injury to the back.  DIAGNOSIS  Most of the time, the direct cause of low back pain is not known. However, back pain can be treated effectively even when the exact cause of the pain is unknown. Answering your caregiver's questions about your overall health and symptoms is one of the most accurate ways to make sure the cause of your pain is not dangerous. If your caregiver needs more information, he or she may order lab work or imaging tests (X-rays or MRIs). However, even if imaging tests show changes in your back, this usually does not require surgery.  HOME CARE INSTRUCTIONS  For many people, back pain returns. Since low back pain is rarely dangerous, it is often a condition that people can learn to manage on their own.   · Remain active. It is stressful on the back to sit or stand in one place. Do not sit, drive, or stand in one place for more than 30 minutes at a time. Take short walks on level surfaces as soon as pain allows. Try to increase the length of time you walk each day.  · Do not stay in bed. Resting more than 1 or 2 days can delay your recovery.  · Do not avoid exercise or work. Your body is made to move. It is not dangerous to be active, even though your back may hurt. Your back will likely heal faster if you return to being active before your pain is gone.  · Pay attention to your body when you  bend and lift. Many people have less discomfort when lifting if they bend their knees, keep the load close to their bodies, and  avoid twisting. Often, the most comfortable positions are those that put less stress on your recovering back.  · Find a comfortable position to sleep. Use a firm mattress and lie on your side with your knees slightly bent. If you lie on your back, put a pillow under your knees.  · Only take over-the-counter or prescription medicines as directed by your caregiver. Over-the-counter medicines to reduce pain and inflammation are often the most helpful. Your caregiver may prescribe muscle relaxant drugs. These medicines help dull your pain so you can more quickly return to your normal activities and healthy exercise.  · Put ice on the injured area.  · Put ice in a plastic bag.  · Place a towel between your skin and the bag.  · Leave the ice on for 15-20 minutes, 3-4 times a day for the first 2 to 3 days. After that, ice and heat may be alternated to reduce pain and spasms.  · Ask your caregiver about trying back exercises and gentle massage. This may be of some benefit.  · Avoid feeling anxious or stressed. Stress increases muscle tension and can worsen back pain. It is important to recognize when you are anxious or stressed and learn ways to manage it. Exercise is a great option.  SEEK MEDICAL CARE IF:  · You have pain that is not relieved with rest or   medicine.  · You have pain that does not improve in 1 week.  · You have new symptoms.  · You are generally not feeling well.  SEEK IMMEDIATE MEDICAL CARE IF:   · You have pain that radiates from your back into your legs.  · You develop new bowel or bladder control problems.  · You have unusual weakness or numbness in your arms or legs.  · You develop nausea or vomiting.  · You develop abdominal pain.  · You feel faint.  Document Released: 04/17/2005 Document Revised: 10/17/2011 Document Reviewed: 09/05/2010  [employer]® Patient Information ©2014 [employer].

## 2012-10-07 NOTE — Assessment & Plan Note (Signed)
I will recheck her TSH today 

## 2012-10-07 NOTE — Assessment & Plan Note (Signed)
Start zoloft

## 2012-10-07 NOTE — Assessment & Plan Note (Addendum)
I will check her plain films to look for DDD, spurring, etc.

## 2012-11-27 ENCOUNTER — Ambulatory Visit (INDEPENDENT_AMBULATORY_CARE_PROVIDER_SITE_OTHER)
Admission: RE | Admit: 2012-11-27 | Discharge: 2012-11-27 | Disposition: A | Discharge status: Home / Self Care | Payer: PRIVATE HEALTH INSURANCE | Source: Ambulatory Visit | Attending: Internal Medicine | Admitting: Internal Medicine

## 2012-11-27 ENCOUNTER — Encounter: Payer: Self-pay | Admitting: Internal Medicine

## 2012-11-27 ENCOUNTER — Ambulatory Visit (INDEPENDENT_AMBULATORY_CARE_PROVIDER_SITE_OTHER): Payer: PRIVATE HEALTH INSURANCE | Admitting: Internal Medicine

## 2012-11-27 VITALS — BP 140/70 | HR 68 | Temp 97.6°F | Resp 16 | Wt 161.0 lb

## 2012-11-27 DIAGNOSIS — R059 Cough, unspecified: Secondary | ICD-10-CM

## 2012-11-27 DIAGNOSIS — J4521 Mild intermittent asthma with (acute) exacerbation: Secondary | ICD-10-CM

## 2012-11-27 DIAGNOSIS — J45909 Unspecified asthma, uncomplicated: Secondary | ICD-10-CM | POA: Insufficient documentation

## 2012-11-27 DIAGNOSIS — J45901 Unspecified asthma with (acute) exacerbation: Secondary | ICD-10-CM

## 2012-11-27 DIAGNOSIS — R05 Cough: Secondary | ICD-10-CM

## 2012-11-27 DIAGNOSIS — R051 Acute cough: Secondary | ICD-10-CM | POA: Insufficient documentation

## 2012-11-27 DIAGNOSIS — I10 Essential (primary) hypertension: Secondary | ICD-10-CM

## 2012-11-27 MED ORDER — LOSARTAN POTASSIUM 100 MG PO TABS: 100.0000 mg | ORAL_TABLET | Freq: Every day | ORAL | Status: DC

## 2012-11-27 MED ORDER — BUDESONIDE-FORMOTEROL FUMARATE 160-4.5 MCG/ACT IN AERO: 2.0000 | INHALATION_SPRAY | Freq: Two times a day (BID) | RESPIRATORY_TRACT | Status: DC

## 2012-11-27 MED ORDER — PROMETHAZINE-DM 6.25-15 MG/5ML PO SYRP: 5.0000 mL | ORAL_SOLUTION | Freq: Four times a day (QID) | ORAL | Status: DC | PRN

## 2012-11-27 MED ORDER — HYDROCOD POLST-CHLORPHEN POLST 10-8 MG/5ML PO LQCR: 5.0000 mL | Freq: Two times a day (BID) | ORAL | Status: DC | PRN

## 2012-11-27 NOTE — Assessment & Plan Note (Signed)
She is already on doxy and smx-tmp after being seen at an [facility] 1 week ago for a bug bite in her left groin, that has healed. None the less, she should not need any more antibiotics I will treat her cough with tussionex and control the wheezing with symbicort

## 2012-11-27 NOTE — Progress Notes (Signed)
Subjective:    Patient ID: Theresa Harrington, female    DOB: 1955/12/09, 57 y.o.   MRN: [id number]  Cough This is a new problem. The current episode started 1 to 4 weeks ago. The problem has been unchanged. The problem occurs every few hours. The cough is productive of sputum (phelgm is clear). Associated symptoms include rhinorrhea and wheezing. Pertinent negatives include no chest pain, chills, ear congestion, ear pain, fever, headaches, heartburn, hemoptysis, myalgias, nasal congestion, postnasal drip, rash, sore throat, shortness of breath, sweats or weight loss. Risk factors for lung disease include smoking/tobacco exposure. She has tried nothing for the symptoms. The treatment provided no relief. There is no history of bronchitis or pneumonia.      Review of Systems  Constitutional: Negative.  Negative for fever, chills, weight loss, diaphoresis, activity change, fatigue and unexpected weight change.  HENT: Positive for rhinorrhea. Negative for ear pain, nosebleeds, congestion, sore throat, trouble swallowing, voice change, postnasal drip, sinus pressure and tinnitus.   Eyes: Negative.   Respiratory: Positive for cough and wheezing. Negative for apnea, hemoptysis, choking, chest tightness, shortness of breath and stridor.   Cardiovascular: Negative.  Negative for chest pain, palpitations and leg swelling.  Gastrointestinal: Negative.  Negative for heartburn, nausea, vomiting, abdominal pain, diarrhea, constipation and blood in stool.  Endocrine: Negative.   Genitourinary: Negative.   Musculoskeletal: Negative.  Negative for myalgias and arthralgias.  Skin: Negative.  Negative for rash.  Allergic/Immunologic: Negative.   Neurological: Negative.  Negative for headaches.  Hematological: Negative.   Psychiatric/Behavioral: Negative.        Objective:   Physical Exam  Vitals reviewed. Constitutional: She is oriented to person, place, and time. She appears well-developed and well-nourished.   Non-toxic appearance. She does not have a sickly appearance. She does not appear ill. No distress.  HENT:  Head: Normocephalic and atraumatic.  Mouth/Throat: Oropharynx is clear and moist. No oropharyngeal exudate.  Eyes: Conjunctivae are normal. Right eye exhibits no discharge. Left eye exhibits no discharge. No scleral icterus.  Neck: Normal range of motion. Neck supple. No JVD present. No tracheal deviation present. No thyromegaly present.  Cardiovascular: Normal rate, regular rhythm, normal heart sounds and intact distal pulses.  Exam reveals no gallop and no friction rub.   No murmur heard. Pulmonary/Chest: Effort normal and breath sounds normal. No stridor. No respiratory distress. She has no wheezes. She has no rales. She exhibits no tenderness.  Abdominal: Soft. Bowel sounds are normal. She exhibits no distension and no mass. There is no tenderness. There is no rebound and no guarding.  Musculoskeletal: Normal range of motion. She exhibits no edema and no tenderness.  Lymphadenopathy:    She has no cervical adenopathy.  Neurological: She is oriented to person, place, and time.  Skin: Skin is warm and dry. No rash noted. She is not diaphoretic. No erythema. No pallor.  Psychiatric: She has a normal mood and affect. Her behavior is normal. Judgment and thought content normal.     Lab Results  Component Value Date   WBC 12.3* 08/06/2012   HGB 12.7 08/06/2012   HCT 37.5 08/06/2012   PLT 264.0 08/06/2012   GLUCOSE 65* 10/07/2012   CHOL 137 08/06/2012   TRIG 103.0 08/06/2012   HDL 53.60 08/06/2012   LDLDIRECT 160.2 03/01/2010   LDLCALC 63 08/06/2012   ALT 17 10/07/2012   AST 18 10/07/2012   NA 141 10/07/2012   K 3.7 10/07/2012   CL 104 10/07/2012   CREATININE 0.6 10/07/2012  BUN 10 10/07/2012   CO2 33* 10/07/2012   TSH 0.25* 10/07/2012   HGBA1C 8.1* 10/07/2012   MICROALBUR 3.5* 03/01/2010       Assessment & Plan:

## 2012-11-27 NOTE — Patient Instructions (Addendum)
Asthma, Adult Asthma is a condition that affects your lungs. It is characterized by swelling and narrowing of your airways as well as increased mucus production. The narrowing comes from swelling and muscle spasms inside the airways. When this happens, breathing can be difficult and you can have coughing, wheezing, and shortness of breath. Knowing more about asthma can help you manage it better. Asthma cannot be cured, but medicines and lifestyle changes can help control it. Asthma can be a minor problem for some people but if it is not controlled it can lead to a life-threatening asthma attack. Asthma can change over time. It is important to work with your caregiver to manage your asthma symptoms. CAUSES The exact cause of asthma is unknown. Asthma is believed to be caused by inherited (genetic) and environmental exposures. Swelling and redness (inflammation) of the airways occurs in asthma. This can be triggered by allergies, viral lung infections, or irritants in the air. Allergic reactions can cause you to wheeze immediately or several hours after an exposure. Asthma triggers are different for each person. It is important to pay attention and know what triggers your asthma.  Common triggers for asthma attacks include:  Animal dander from the skin, hair, or feathers of animals.  Dust mites contained in house dust.  Cockroaches.  Pollen from trees or grass.  Mold.  Cigarette or tobacco smoke. Smoking cannot be allowed in homes of people with asthma. People with asthma should not smoke and should not be around smokers.  Air pollutants such as dust, household cleaners, hair sprays, aerosol sprays, paint fumes, strong chemicals, or strong odors.  Cold air or weather changes. Cold air may cause inflammation. Winds increase molds and pollens in the air. There is not one best climate for people with asthma.  Strong emotions such as crying or laughing hard.  Stress.  Certain medicines such as  aspirin or beta-blockers.  Sulfites in such foods and drinks as dried fruits and wine.  Infections or inflammatory conditions such as the flu, a cold, or an inflammation of the nasal membranes (rhinitis).  Gastroesophageal reflux disease (GERD). GERD is a condition where stomach acid backs up into your throat (esophagus).  Exercise or strenous activity. Proper pre-exercise medicines allow most people to participate in sports. SYMPTOMS  Feeling short of breath.  Chest tightness or pain.  Difficulty sleeping due to coughing, wheezing, or feeling short of breath.  A whistling or wheezing sound with exhalation.  Coughing or wheezing that is worse when you:  Have a virus (such as a cold or the flu).  Are suffering from allergies.  Are exposed to certain fumes or chemicals.  Exercise. Signs that your asthma is probably getting worse include:   More frequent and bothersome asthma signs and symptoms.  Increasing difficulty breathing. This can be measured by a peak flow meter, which is a simple device used to check how well your lungs are working.  An increasingly frequent need to use a quick-relief inhaler. DIAGNOSIS  The diagnosis of asthma is made by review of your medical history, a physical exam, and possibly from other tests. Lung function studies may help with the diagnosis. TREATMENT  Asthma cannot be cured. However, for the majority of adults, asthma can be controlled with treatment. Besides avoidance of triggers of your asthma, medicines are often required. There are 2 classes of medicine used for asthma treatment: controller medicines (reduce inflammation and symptoms) andreliever or rescue medicines (relieve asthma symptoms during acute attacks). You may require daily   medicines to control your asthma. The most effective long-term controller medicines for asthma are inhaled corticosteroids (blocks inflammation). Other long-term control medicines include:  Leukotriene  receptor antagonists (blocks a pathway of inflammation).  Long-acting beta2-agonists (relaxes the muscles of the airways for at least 12 hours) with an inhaled corticosteroid.  Cromolyn sodium or nedocromil (alters certain inflammatory cells' ability to release chemicals that cause inflammation).  Immunomodulators (alters the immune system to prevent asthma symptoms).  Theophylline (relaxes muscles in the airways). You may also require a short-acting beta2-agonist to relieve asthma symptoms during an acute attack. You should understand what to do during an acute attack. Inhaled medicines are effective when used properly. Read the instructions on how to use your medicines correctly and speak to your caregiver if you have questions. Follow up with your caregiver on a regular basis to make sure your asthma is well-controlled. If your asthma is not well-controlled, if you have been hospitalized for asthma, or if multiple medicines or medium to high doses of inhaled corticosteroids are needed to control your asthma, request a referral to an asthma specialist. HOME CARE INSTRUCTIONS   Take medicines as directed by your caregiver.  Control your home environment in the following ways to help prevent asthma attacks:  Change your heating and air conditioning filter at least once a month.  Place a filter or cheesecloth over your heating and air conditioning vents.  Limit the use of fireplaces and wood stoves.  Do not smoke. Do not stay in places where others are smoking.  Get rid of pests (such as roaches and mice) and their droppings.  If you see mold on a plant, throw it away.  Clean your floors and dust every week. Use unscented cleaning products. Use a vacuum cleaner with a HEPA filter if possible. If vacuuming or cleaning triggers your asthma, try to find someone else to do these chores.  Floors in your house should be wood, tile, or vinyl. Carpet can trap dander and dust.  Use  allergy-proof pillows, mattress covers, and box spring covers.  Wash bedsheets and blankets every week in hot water and dry in a dryer.  Use a blanket that is made of polyester or cotton with a tight nap.  Do not use a dust ruffle on your bed.  Clean bathrooms and kitchens with bleach and repaint with mold-resistant paint.  Wash hands frequently.  Talk to your caregiver about an action plan for managing asthma attacks. This includes the use of a peak flow meter which measures the severity of the attack and medicines that can help stop the attack. An action plan can help minimize or stop the attack without having to seek medical care.  Remain calm during an asthma attack.  Always have a plan prepared for seeking medical attention. This should include contacting your caregiver and in the case of a severe attack, calling your local emergency services (911 in [place] SEEK MEDICAL CARE IF:   You have wheezing, shortness of breath, or a cough even if taking medicine to prevent attacks.  You have thickening of sputum.  Your sputum changes from clear or white to yellow, green, gray, or bloody.  You have any problems that may be related to the medicines you are taking (such as a rash, itching, swelling, or trouble breathing).  You are using a reliever medicine more than 2 3 times per week.  Your peak flow is still at 50 79% of personal best after following your action plan for 1   hour. SEEK IMMEDIATE MEDICAL CARE IF:   You are short of breath even at rest.  You get short of breath when doing very little physical activity.  You have difficulty eating, drinking, or talking due to asthma symptoms.  You have chest pain or you feel that your heart is beating fast.  You have a bluish color to your lips or fingernails.  You are lightheaded, dizzy, or faint.  You have a fever or persistent symptoms for more than 2 3 days.  You have a fever and symptoms suddenly get worse.  You seem to be  getting worse and are unresponsive to treatment during an asthma attack.  Your peak flow is less than 50% of personal best. MAKE SURE YOU:   Understand these instructions.  Will watch your condition.  Will get help right away if you are not doing well or get worse. Document Released: 04/17/2005 Document Revised: 04/03/2012 Document Reviewed: 12/04/2007 [employer] Patient Information 2014 [employer].  

## 2012-11-28 ENCOUNTER — Encounter: Payer: Self-pay | Admitting: Internal Medicine

## 2012-11-28 NOTE — Assessment & Plan Note (Signed)
See above

## 2012-11-28 NOTE — Assessment & Plan Note (Signed)
Her BP is well controlled 

## 2012-12-01 ENCOUNTER — Other Ambulatory Visit: Payer: Self-pay | Admitting: Internal Medicine

## 2012-12-02 ENCOUNTER — Telehealth: Payer: Self-pay | Admitting: *Deleted

## 2012-12-02 NOTE — Telephone Encounter (Signed)
Called left msg. To call back 

## 2012-12-02 NOTE — Telephone Encounter (Signed)
Spoke with Theresa Harrington advised her of MDs result note.  Theresa Harrington states lump is on the right breast near the cleavage, further states it is about the size of a pea.  Please advise. 

## 2012-12-02 NOTE — Telephone Encounter (Signed)
Pt called requesting chest Xray results.  Pt also requests a  [facility], states seh found a lump in her breast.

## 2012-12-02 NOTE — Telephone Encounter (Signed)
Spoke with pt advised her of MDs result note.  Pt states lump is on the right breast near the cleavage, further states it is about the size of a pea.  Please advise.

## 2012-12-02 NOTE — Telephone Encounter (Signed)
Her chest xray was normal Where is the lump?

## 2013-02-20 ENCOUNTER — Other Ambulatory Visit: Payer: Self-pay | Admitting: *Deleted

## 2013-02-20 MED ORDER — PANTOPRAZOLE SODIUM 40 MG PO TBEC: DELAYED_RELEASE_TABLET | ORAL | Status: DC

## 2013-02-28 ENCOUNTER — Other Ambulatory Visit (INDEPENDENT_AMBULATORY_CARE_PROVIDER_SITE_OTHER): Payer: BC Managed Care – PPO

## 2013-02-28 ENCOUNTER — Encounter: Payer: Self-pay | Admitting: Internal Medicine

## 2013-02-28 ENCOUNTER — Ambulatory Visit (INDEPENDENT_AMBULATORY_CARE_PROVIDER_SITE_OTHER): Payer: BC Managed Care – PPO | Admitting: Internal Medicine

## 2013-02-28 VITALS — BP 120/72 | HR 60 | Temp 98.2°F | Resp 16 | Ht 67.0 in | Wt 163.0 lb

## 2013-02-28 DIAGNOSIS — E0590 Thyrotoxicosis, unspecified without thyrotoxic crisis or storm: Secondary | ICD-10-CM

## 2013-02-28 DIAGNOSIS — E1159 Type 2 diabetes mellitus with other circulatory complications: Secondary | ICD-10-CM

## 2013-02-28 DIAGNOSIS — I10 Essential (primary) hypertension: Secondary | ICD-10-CM

## 2013-02-28 LAB — BASIC METABOLIC PANEL
BUN: 13 mg/dL (ref 6–23)
Creatinine, Ser: 0.7 mg/dL (ref 0.4–1.2)
GFR: 91.99 mL/min (ref >60.00)
Potassium: 3.9 mEq/L (ref 3.5–5.1)

## 2013-02-28 LAB — HEMOGLOBIN A1C: Hgb A1c MFr Bld: 8.5 % — ABNORMAL HIGH (ref 4.6–6.5)

## 2013-02-28 MED ORDER — CANAGLIFLOZIN-METFORMIN HCL 150-1000 MG PO TABS: 1.0000 | ORAL_TABLET | Freq: Two times a day (BID) | ORAL | Status: DC

## 2013-02-28 NOTE — Assessment & Plan Note (Signed)
Her BP is well controlled 

## 2013-02-28 NOTE — Progress Notes (Signed)
Subjective:    Patient ID: Theresa Harrington, female    DOB: [birth year], 56 y.o.   MRN: [id number]  Diabetes She presents for her follow-up diabetic visit. She has type 2 diabetes mellitus. Her disease course has been fluctuating. There are no hypoglycemic associated symptoms. Pertinent negatives for hypoglycemia include no dizziness or tremors. Pertinent negatives for diabetes include no blurred vision, no chest pain, no fatigue, no foot paresthesias, no foot ulcerations, no polydipsia, no polyphagia, no polyuria, no visual change, no weakness and no weight loss. There are no hypoglycemic complications. Symptoms are stable. Diabetic complications include PVD. Current diabetic treatment includes oral agent (triple therapy). She is compliant with treatment most of the time. Her weight is stable. She is following a generally healthy diet. Meal planning includes avoidance of concentrated sweets. She has not had a previous visit with a dietician. She participates in exercise intermittently. Her breakfast blood glucose range is generally 140-180 mg/dl. Her lunch blood glucose range is generally 140-180 mg/dl. Her dinner blood glucose range is generally 180-200 mg/dl. Her highest blood glucose is >200 mg/dl. Her overall blood glucose range is 180-200 mg/dl. An ACE inhibitor/angiotensin II receptor blocker is being taken. She does not see a podiatrist.Eye exam is not current.      Review of Systems  Constitutional: Negative.  Negative for fever, chills, weight loss, diaphoresis, appetite change and fatigue.  HENT: Negative.   Eyes: Negative.  Negative for blurred vision.  Respiratory: Negative.  Negative for cough, chest tightness, shortness of breath, wheezing and stridor.   Cardiovascular: Negative.  Negative for chest pain, palpitations and leg swelling.  Gastrointestinal: Negative.  Negative for nausea, abdominal pain, diarrhea, constipation and blood in stool.  Endocrine: Negative.  Negative for polydipsia,  polyphagia and polyuria.  Genitourinary: Negative.   Musculoskeletal: Negative.  Negative for arthralgias, joint swelling and myalgias.  Skin: Negative.   Neurological: Negative.  Negative for dizziness, tremors, weakness, light-headedness and numbness.  Hematological: Negative.  Negative for adenopathy. Does not bruise/bleed easily.  Psychiatric/Behavioral: Negative.        Objective:   Physical Exam  Vitals reviewed. Constitutional: She is oriented to person, place, and time. She appears well-developed and well-nourished. No distress.  HENT:  Head: Normocephalic and atraumatic.  Mouth/Throat: Oropharynx is clear and moist. No oropharyngeal exudate.  Eyes: Conjunctivae are normal. Right eye exhibits no discharge. Left eye exhibits no discharge. No scleral icterus.  Neck: Normal range of motion. Neck supple. No JVD present. No tracheal deviation present. No thyromegaly present.  Cardiovascular: Normal rate, regular rhythm, normal heart sounds and intact distal pulses.  Exam reveals no gallop and no friction rub.   No murmur heard. Pulses:      Carotid pulses are 1+ on the right side, and 1+ on the left side.      Radial pulses are 1+ on the right side, and 1+ on the left side.       Femoral pulses are 1+ on the right side, and 1+ on the left side.      Popliteal pulses are 1+ on the right side, and 1+ on the left side.       Dorsalis pedis pulses are 1+ on the right side, and 1+ on the left side.       Posterior tibial pulses are 1+ on the right side, and 1+ on the left side.  Pulmonary/Chest: Effort normal and breath sounds normal. No stridor. No respiratory distress. She has no wheezes. She has no rales. She  exhibits no tenderness.  Abdominal: Soft. Bowel sounds are normal. She exhibits no distension and no mass. There is no tenderness. There is no rebound and no guarding.  Musculoskeletal: Normal range of motion. She exhibits no edema and no tenderness.  Lymphadenopathy:    She has  no cervical adenopathy.  Neurological: She is oriented to person, place, and time.  Skin: Skin is warm and dry. No rash noted. She is not diaphoretic. No erythema. No pallor.     Lab Results  Component Value Date   WBC 12.3* 08/06/2012   HGB 12.7 08/06/2012   HCT 37.5 08/06/2012   PLT 264.0 08/06/2012   GLUCOSE 65* 10/07/2012   CHOL 137 08/06/2012   TRIG 103.0 08/06/2012   HDL 53.60 08/06/2012   LDLDIRECT 160.2 03/01/2010   LDLCALC 63 08/06/2012   ALT 17 10/07/2012   AST 18 10/07/2012   NA 141 10/07/2012   K 3.7 10/07/2012   CL 104 10/07/2012   CREATININE 0.6 10/07/2012   BUN 10 10/07/2012   CO2 33* 10/07/2012   TSH 0.25* 10/07/2012   HGBA1C 8.1* 10/07/2012   MICROALBUR 3.5* 03/01/2010       Assessment & Plan:

## 2013-02-28 NOTE — Assessment & Plan Note (Signed)
Her A1C has not been well controlled She will combine metformin and invokana into one tab qd I will recheck her A1C and her renal function today

## 2013-02-28 NOTE — Assessment & Plan Note (Signed)
She appears to be euthyroid Will recheck her TSH today

## 2013-02-28 NOTE — Patient Instructions (Signed)
Type 2 Diabetes Mellitus, Adult Type 2 diabetes mellitus, often simply referred to as type 2 diabetes, is a long-lasting (chronic) disease. In type 2 diabetes, the pancreas does not make enough insulin (a hormone), the cells are less responsive to the insulin that is made (insulin resistance), or both. Normally, insulin moves sugars from food into the tissue cells. The tissue cells use the sugars for energy. The lack of insulin or the lack of normal response to insulin causes excess sugars to build up in the blood instead of going into the tissue cells. As a result, high blood sugar (hyperglycemia) develops. The effect of high sugar (glucose) levels can cause many complications. Type 2 diabetes was also previously called adult-onset diabetes but it can occur at any age.  RISK FACTORS  A person is predisposed to developing type 2 diabetes if someone in the family has the disease and also has one or more of the following primary risk factors:  Overweight.  An inactive lifestyle.  A history of consistently eating high-calorie foods. Maintaining a normal weight and regular physical activity can reduce the chance of developing type 2 diabetes. SYMPTOMS  A person with type 2 diabetes may not show symptoms initially. The symptoms of type 2 diabetes appear slowly. The symptoms include:  Increased thirst (polydipsia).  Increased urination (polyuria).  Increased urination during the night (nocturia).  Weight loss. This weight loss may be rapid.  Frequent, recurring infections.  Tiredness (fatigue).  Weakness.  Vision changes, such as blurred vision.  Fruity smell to your breath.  Abdominal pain.  Nausea or vomiting.  Cuts or bruises which are slow to heal.  Tingling or numbness in the hands or feet. DIAGNOSIS Type 2 diabetes is frequently not diagnosed until complications of diabetes are present. Type 2 diabetes is diagnosed when symptoms or complications are present and when blood  glucose levels are increased. Your blood glucose level may be checked by one or more of the following blood tests:  A fasting blood glucose test. You will not be allowed to eat for at least 8 hours before a blood sample is taken.  A random blood glucose test. Your blood glucose is checked at any time of the day regardless of when you ate.  A hemoglobin A1c blood glucose test. A hemoglobin A1c test provides information about blood glucose control over the previous 3 months.  An oral glucose tolerance test (OGTT). Your blood glucose is measured after you have not eaten (fasted) for 2 hours and then after you drink a glucose-containing beverage. TREATMENT   You may need to take insulin or diabetes medicine daily to keep blood glucose levels in the desired range.  You will need to match insulin dosing with exercise and healthy food choices. The treatment goal is to maintain the before meal blood sugar (preprandial glucose) level at 70 130 mg/dL. HOME CARE INSTRUCTIONS   Have your hemoglobin A1c level checked twice a year.  Perform daily blood glucose monitoring as directed by your caregiver.  Monitor urine ketones when you are ill and as directed by your caregiver.  Take your diabetes medicine or insulin as directed by your caregiver to maintain your blood glucose levels in the desired range.  Never run out of diabetes medicine or insulin. It is needed every day.  Adjust insulin based on your intake of carbohydrates. Carbohydrates can raise blood glucose levels but need to be included in your diet. Carbohydrates provide vitamins, minerals, and fiber which are an essential part of   a healthy diet. Carbohydrates are found in fruits, vegetables, whole grains, dairy products, legumes, and foods containing added sugars.    Eat healthy foods. Alternate 3 meals with 3 snacks.  Lose weight if overweight.  Carry a medical alert card or wear your medical alert jewelry.  Carry a 15 gram  carbohydrate snack with you at all times to treat low blood glucose (hypoglycemia). Some examples of 15 gram carbohydrate snacks include:  Glucose tablets, 3 or 4   Glucose gel, 15 gram tube  Raisins, 2 tablespoons (24 grams)  Jelly beans, 6  Animal crackers, 8  Regular pop, 4 ounces (120 mL)  Gummy treats, 9  Recognize hypoglycemia. Hypoglycemia occurs with blood glucose levels of 70 mg/dL and below. The risk for hypoglycemia increases when fasting or skipping meals, during or after intense exercise, and during sleep. Hypoglycemia symptoms can include:  Tremors or shakes.  Decreased ability to concentrate.  Sweating.  Increased heart rate.  Headache.  Dry mouth.  Hunger.  Irritability.  Anxiety.  Restless sleep.  Altered speech or coordination.  Confusion.  Treat hypoglycemia promptly. If you are alert and able to safely swallow, follow the 15:15 rule:  Take 15 20 grams of rapid-acting glucose or carbohydrate. Rapid-acting options include glucose gel, glucose tablets, or 4 ounces (120 mL) of fruit juice, regular soda, or low fat milk.  Check your blood glucose level 15 minutes after taking the glucose.  Take 15 20 grams more of glucose if the repeat blood glucose level is still 70 mg/dL or below.  Eat a meal or snack within 1 hour once blood glucose levels return to normal.    Be alert to polyuria and polydipsia which are early signs of hyperglycemia. An early awareness of hyperglycemia allows for prompt treatment. Treat hyperglycemia as directed by your caregiver.  Engage in at least 150 minutes of moderate-intensity physical activity a week, spread over at least 3 days of the week or as directed by your caregiver. In addition, you should engage in resistance exercise at least 2 times a week or as directed by your caregiver.  Adjust your medicine and food intake as needed if you start a new exercise or sport.  Follow your sick day plan at any time you  are unable to eat or drink as usual.  Avoid tobacco use.  Limit alcohol intake to no more than 1 drink per day for nonpregnant women and 2 drinks per day for men. You should drink alcohol only when you are also eating food. Talk with your caregiver whether alcohol is safe for you. Tell your caregiver if you drink alcohol several times a week.  Follow up with your caregiver regularly.  Schedule an eye exam soon after the diagnosis of type 2 diabetes and then annually.  Perform daily skin and foot care. Examine your skin and feet daily for cuts, bruises, redness, nail problems, bleeding, blisters, or sores. A foot exam by a caregiver should be done annually.  Brush your teeth and gums at least twice a day and floss at least once a day. Follow up with your dentist regularly.  Share your diabetes management plan with your workplace or school.  Stay up-to-date with immunizations.  Learn to manage stress.  Obtain ongoing diabetes education and support as needed.  Participate in, or seek rehabilitation as needed to maintain or improve independence and quality of life. Request a physical or occupational therapy referral if you are having foot or hand numbness or difficulties with grooming,   dressing, eating, or physical activity. SEEK MEDICAL CARE IF:   You are unable to eat food or drink fluids for more than 6 hours.  You have nausea and vomiting for more than 6 hours.  Your blood glucose level is over 240 mg/dL.  There is a change in mental status.  You develop an additional serious illness.  You have diarrhea for more than 6 hours.  You have been sick or have had a fever for a couple of days and are not getting better.  You have pain during any physical activity.  SEEK IMMEDIATE MEDICAL CARE IF:  You have difficulty breathing.  You have moderate to large ketone levels. MAKE SURE YOU:  Understand these instructions.  Will watch your condition.  Will get help right away if  you are not doing well or get worse. Document Released: 04/17/2005 Document Revised: 01/10/2012 Document Reviewed: 11/14/2011 [employer] Patient Information 2014 [employer].  

## 2013-03-20 ENCOUNTER — Other Ambulatory Visit: Payer: Self-pay | Admitting: Internal Medicine

## 2013-04-02 ENCOUNTER — Other Ambulatory Visit: Payer: Self-pay | Admitting: Internal Medicine

## 2013-05-07 ENCOUNTER — Ambulatory Visit (INDEPENDENT_AMBULATORY_CARE_PROVIDER_SITE_OTHER)
Admission: RE | Admit: 2013-05-07 | Discharge: 2013-05-07 | Disposition: A | Discharge status: Home / Self Care | Payer: BC Managed Care – PPO | Source: Ambulatory Visit | Attending: Internal Medicine | Admitting: Internal Medicine

## 2013-05-07 ENCOUNTER — Ambulatory Visit (INDEPENDENT_AMBULATORY_CARE_PROVIDER_SITE_OTHER): Payer: BC Managed Care – PPO | Admitting: Internal Medicine

## 2013-05-07 ENCOUNTER — Other Ambulatory Visit: Payer: Self-pay | Admitting: Internal Medicine

## 2013-05-07 ENCOUNTER — Encounter: Payer: Self-pay | Admitting: Internal Medicine

## 2013-05-07 VITALS — BP 118/70 | HR 60 | Temp 97.6°F | Resp 16 | Ht 67.0 in | Wt 167.0 lb

## 2013-05-07 DIAGNOSIS — R05 Cough: Secondary | ICD-10-CM

## 2013-05-07 DIAGNOSIS — J4521 Mild intermittent asthma with (acute) exacerbation: Secondary | ICD-10-CM

## 2013-05-07 DIAGNOSIS — J45909 Unspecified asthma, uncomplicated: Secondary | ICD-10-CM

## 2013-05-07 DIAGNOSIS — Z23 Encounter for immunization: Secondary | ICD-10-CM

## 2013-05-07 DIAGNOSIS — R059 Cough, unspecified: Secondary | ICD-10-CM

## 2013-05-07 DIAGNOSIS — K219 Gastro-esophageal reflux disease without esophagitis: Secondary | ICD-10-CM

## 2013-05-07 DIAGNOSIS — J45901 Unspecified asthma with (acute) exacerbation: Secondary | ICD-10-CM

## 2013-05-07 MED ORDER — PANTOPRAZOLE SODIUM 40 MG PO TBEC: DELAYED_RELEASE_TABLET | ORAL | Status: DC

## 2013-05-07 MED ORDER — HYDROCOD POLST-CHLORPHEN POLST 10-8 MG/5ML PO LQCR: 5.0000 mL | Freq: Two times a day (BID) | ORAL | Status: DC | PRN

## 2013-05-07 NOTE — Patient Instructions (Signed)

## 2013-05-07 NOTE — Progress Notes (Signed)
Subjective:    Patient ID: Theresa Harrington, female    DOB: 1956-03-27, 58 y.o.   MRN: [id number]  Cough This is a new problem. The current episode started in the past 7 days. The problem has been gradually worsening. The problem occurs every few hours. The cough is non-productive. Associated symptoms include shortness of breath and wheezing. Pertinent negatives include no chest pain, chills, ear congestion, ear pain, fever, headaches, heartburn, hemoptysis, myalgias, nasal congestion, postnasal drip, rash, sore throat, sweats or weight loss. The symptoms are aggravated by cold air. She has tried OTC cough suppressant and prescription cough suppressant for the symptoms. The treatment provided mild relief. Her past medical history is significant for asthma. There is no history of bronchiectasis, bronchitis, COPD, emphysema, environmental allergies or pneumonia.      Review of Systems  Constitutional: Negative.  Negative for fever, chills, weight loss, diaphoresis, appetite change and fatigue.  HENT: Negative.  Negative for ear pain, postnasal drip, sinus pressure, sneezing, sore throat, trouble swallowing and voice change.   Eyes: Negative.   Respiratory: Positive for cough, shortness of breath and wheezing. Negative for apnea, hemoptysis, choking, chest tightness and stridor.   Cardiovascular: Negative.  Negative for chest pain, palpitations and leg swelling.  Gastrointestinal: Negative.  Negative for heartburn, nausea, vomiting, abdominal pain, diarrhea, constipation and blood in stool.  Endocrine: Negative.   Genitourinary: Negative.   Musculoskeletal: Negative.  Negative for myalgias.  Skin: Negative for rash.  Allergic/Immunologic: Negative.  Negative for environmental allergies.  Neurological: Negative.  Negative for dizziness, tremors, weakness, light-headedness and headaches.  Hematological: Negative.  Negative for adenopathy. Does not bruise/bleed easily.  Psychiatric/Behavioral:  Negative.        Objective:   Physical Exam  Vitals reviewed. Constitutional: She is oriented to person, place, and time. She appears well-developed and well-nourished.  Non-toxic appearance. She does not have a sickly appearance. She does not appear ill. No distress.  HENT:  Head: Normocephalic and atraumatic.  Mouth/Throat: Oropharynx is clear and moist. No oropharyngeal exudate.  Eyes: Conjunctivae are normal. Right eye exhibits no discharge. Left eye exhibits no discharge. No scleral icterus.  Neck: Normal range of motion. Neck supple. No JVD present. No tracheal deviation present. No thyromegaly present.  Cardiovascular: Normal rate, regular rhythm, normal heart sounds and intact distal pulses.  Exam reveals no gallop and no friction rub.   No murmur heard. Pulmonary/Chest: Effort normal. No accessory muscle usage or stridor. Not tachypneic. No respiratory distress. She has no decreased breath sounds. She has no wheezes. She has rhonchi in the right lower field and the left lower field. She has no rales.  Abdominal: Soft. Bowel sounds are normal. She exhibits no distension and no mass. There is no tenderness. There is no rebound and no guarding.  Musculoskeletal: Normal range of motion. She exhibits no edema and no tenderness.  Lymphadenopathy:    She has no cervical adenopathy.  Neurological: She is oriented to person, place, and time.  Skin: Skin is warm and dry. No rash noted. She is not diaphoretic. No erythema. No pallor.  Psychiatric: She has a normal mood and affect. Her behavior is normal. Judgment and thought content normal.     Lab Results  Component Value Date   WBC 12.3* 08/06/2012   HGB 12.7 08/06/2012   HCT 37.5 08/06/2012   PLT 264.0 08/06/2012   GLUCOSE 149* 02/28/2013   CHOL 137 08/06/2012   TRIG 103.0 08/06/2012   HDL 53.60 08/06/2012   LDLDIRECT  160.2 03/01/2010   LDLCALC 63 08/06/2012   ALT 17 10/07/2012   AST 18 10/07/2012   NA 138 02/28/2013   K 3.9 02/28/2013   CL 104  02/28/2013   CREATININE 0.7 02/28/2013   BUN 13 02/28/2013   CO2 27 02/28/2013   TSH 0.42 02/28/2013   HGBA1C 8.5* 02/28/2013   MICROALBUR 3.5* 03/01/2010        Assessment & Plan:

## 2013-05-09 ENCOUNTER — Encounter: Payer: Self-pay | Admitting: Internal Medicine

## 2013-05-09 ENCOUNTER — Ambulatory Visit (INDEPENDENT_AMBULATORY_CARE_PROVIDER_SITE_OTHER): Payer: BC Managed Care – PPO | Admitting: Internal Medicine

## 2013-05-09 VITALS — BP 140/80 | HR 55 | Temp 97.4°F | Resp 16 | Wt 167.0 lb

## 2013-05-09 DIAGNOSIS — J45909 Unspecified asthma, uncomplicated: Secondary | ICD-10-CM

## 2013-05-09 DIAGNOSIS — J309 Allergic rhinitis, unspecified: Secondary | ICD-10-CM

## 2013-05-09 MED ORDER — METHYLPREDNISOLONE ACETATE 80 MG/ML IJ SUSP
120.0000 mg | Freq: Once | INTRAMUSCULAR | Status: AC
  Administered 2013-05-09: 120 mg via INTRAMUSCULAR

## 2013-05-09 NOTE — Patient Instructions (Signed)
Asthma, Adult Asthma is a recurring condition in which the airways tighten and narrow. Asthma can make it difficult to breathe. It can cause coughing, wheezing, and shortness of breath. Asthma episodes (also called asthma attacks) range from minor to life-threatening. Asthma cannot be cured, but medicines and lifestyle changes can help control it. CAUSES Asthma is believed to be caused by inherited (genetic) and environmental factors, but its exact cause is unknown. Asthma may be triggered by allergens, lung infections, or irritants in the air. Asthma triggers are different for each person. Common triggers include:   Animal dander.  Dust mites.  Cockroaches.  Pollen from trees or grass.  Mold.  Smoke.  Air pollutants such as dust, household cleaners, hair sprays, aerosol sprays, paint fumes, strong chemicals, or strong odors.  Cold air, weather changes, and winds (which increase molds and pollens in the air).  Strong emotional expressions such as crying or laughing hard.  Stress.  Certain medicines (such as aspirin) or types of drugs (such as beta-blockers).  Sulfites in foods and drinks. Foods and drinks that may contain sulfites include dried fruit, potato chips, and sparkling grape juice.  Infections or inflammatory conditions such as the flu, a cold, or an inflammation of the nasal membranes (rhinitis).  Gastroesophageal reflux disease (GERD).  Exercise or strenuous activity. SYMPTOMS Symptoms may occur immediately after asthma is triggered or many hours later. Symptoms include:  Wheezing.  Excessive nighttime or early morning coughing.  Frequent or severe coughing with a common cold.  Chest tightness.  Shortness of breath. DIAGNOSIS  The diagnosis of asthma is made by a review of your medical history and a physical exam. Tests may also be performed. These may include:  Lung function studies. These tests show how much air you breath in and out.  Allergy  tests.  Imaging tests such as X-rays. TREATMENT  Asthma cannot be cured, but it can usually be controlled. Treatment involves identifying and avoiding your asthma triggers. It also involves medicines. There are 2 classes of medicine used for asthma treatment:   Controller medicines. These prevent asthma symptoms from occurring. They are usually taken every day.  Reliever or rescue medicines. These quickly relieve asthma symptoms. They are used as needed and provide short-term relief. Your health care provider will help you create an asthma action plan. An asthma action plan is a written plan for managing and treating your asthma attacks. It includes a list of your asthma triggers and how they may be avoided. It also includes information on when medicines should be taken and when their dosage should be changed. An action plan may also involve the use of a device called a peak flow meter. A peak flow meter measures how well the lungs are working. It helps you monitor your condition. HOME CARE INSTRUCTIONS   Take medicine as directed by your health care provider. Speak with your health care provider if you have questions about how or when to take the medicines.  Use a peak flow meter as directed by your health care provider. Record and keep track of readings.  Understand and use the action plan to help minimize or stop an asthma attack without needing to seek medical care.  Control your home environment in the following ways to help prevent asthma attacks:  Do not smoke. Avoid being exposed to secondhand smoke.  Change your heating and air conditioning filter regularly.  Limit your use of fireplaces and wood stoves.  Get rid of pests (such as roaches and   mice) and their droppings.  Throw away plants if you see mold on them.  Clean your floors and dust regularly. Use unscented cleaning products.  Try to have someone else vacuum for you regularly. Stay out of rooms while they are being  vacuumed and for a short while afterward. If you vacuum, use a dust mask from a hardware store, a double-layered or microfilter vacuum cleaner bag, or a vacuum cleaner with a HEPA filter.  Replace carpet with wood, tile, or vinyl flooring. Carpet can trap dander and dust.  Use allergy-proof pillows, mattress covers, and box spring covers.  Wash bed sheets and blankets every week in hot water and dry them in a dryer.  Use blankets that are made of polyester or cotton.  Clean bathrooms and kitchens with bleach. If possible, have someone repaint the walls in these rooms with mold-resistant paint. Keep out of the rooms that are being cleaned and painted.  Wash hands frequently. SEEK MEDICAL CARE IF:   You have wheezing, shortness of breath, or a cough even if taking medicine to prevent attacks.  The colored mucus you cough up (sputum) is thicker than usual.  Your sputum changes from clear or white to yellow, green, gray, or bloody.  You have any problems that may be related to the medicines you are taking (such as a rash, itching, swelling, or trouble breathing).  You are using a reliever medicine more than 2 3 times per week.  Your peak flow is still at 50 79% of you personal best after following your action plan for 1 hour. SEEK IMMEDIATE MEDICAL CARE IF:   You seem to be getting worse and are unresponsive to treatment during an asthma attack.  You are short of breath even at rest.  You get short of breath when doing very little physical activity.  You have difficulty eating, drinking, or talking due to asthma symptoms.  You develop chest pain.  You develop a fast heartbeat.  You have a bluish color to your lips or fingernails.  You are lightheaded, dizzy, or faint.  Your peak flow is less than 50% of your personal best.  You have a fever or persistent symptoms for more than 2 3 days.  You have a fever and symptoms suddenly get worse. MAKE SURE YOU:   Understand these  instructions.  Will watch your condition.  Will get help right away if you are not doing well or get worse. Document Released: 04/17/2005 Document Revised: 12/18/2012 Document Reviewed: 11/14/2012 [employer] Patient Information 2014 [employer].  

## 2013-05-09 NOTE — Assessment & Plan Note (Addendum)
Will start treating this with symbicort inh - I gave ger samples of this and I showed her how to use it and she demonstrated proficiency with its use She will cont cough suppressants as needed Antibiotics are not indicated

## 2013-05-09 NOTE — Assessment & Plan Note (Signed)
Her CXR is normal.

## 2013-05-09 NOTE — Progress Notes (Signed)
Pre visit review using our clinic review tool, if applicable. No additional management support is needed unless otherwise documented below in the visit note. 

## 2013-05-09 NOTE — Progress Notes (Signed)
Subjective:    Patient ID: Theresa Harrington, female    DOB: 1956/03/16, 58 y.o.   MRN: [id number]  Cough This is a recurrent problem. The current episode started in the past 7 days. The problem has been gradually worsening. The problem occurs every few hours. The cough is non-productive. Associated symptoms include postnasal drip, rhinorrhea and wheezing. Pertinent negatives include no chest pain, chills, ear congestion, ear pain, fever, headaches, heartburn, hemoptysis, myalgias, nasal congestion, rash, sore throat, shortness of breath, sweats or weight loss. The symptoms are aggravated by cold air. She has tried a beta-agonist inhaler, steroid inhaler and prescription cough suppressant for the symptoms. The treatment provided mild relief. Her past medical history is significant for asthma. There is no history of bronchiectasis, bronchitis, COPD, emphysema, environmental allergies or pneumonia.      Review of Systems  Constitutional: Negative.  Negative for fever, chills, weight loss, diaphoresis, appetite change and fatigue.  HENT: Positive for postnasal drip and rhinorrhea. Negative for ear pain, facial swelling, sinus pressure, sore throat, tinnitus, trouble swallowing and voice change.   Eyes: Negative.   Respiratory: Positive for cough and wheezing. Negative for apnea, hemoptysis, choking, chest tightness, shortness of breath and stridor.   Cardiovascular: Negative.  Negative for chest pain, palpitations and leg swelling.  Gastrointestinal: Negative.  Negative for heartburn, nausea, vomiting, abdominal pain, diarrhea, constipation and blood in stool.  Endocrine: Negative.   Genitourinary: Negative.   Musculoskeletal: Negative.  Negative for myalgias.  Skin: Negative.  Negative for rash.  Allergic/Immunologic: Negative.  Negative for environmental allergies.  Neurological: Negative.  Negative for headaches.  Hematological: Negative.  Negative for adenopathy. Does not bruise/bleed easily.    Psychiatric/Behavioral: Negative.        Objective:   Physical Exam  Vitals reviewed. Constitutional: She is oriented to person, place, and time. She appears well-developed and well-nourished.  Non-toxic appearance. She does not have a sickly appearance. She does not appear ill. No distress.  HENT:  Head: Normocephalic and atraumatic.  Nose: No mucosal edema or rhinorrhea. Right sinus exhibits no maxillary sinus tenderness and no frontal sinus tenderness. Left sinus exhibits no maxillary sinus tenderness and no frontal sinus tenderness.  Mouth/Throat: Oropharynx is clear and moist and mucous membranes are normal. Mucous membranes are not pale, not dry and not cyanotic. No oral lesions. No trismus in the jaw. No uvula swelling. No oropharyngeal exudate, posterior oropharyngeal edema, posterior oropharyngeal erythema or tonsillar abscesses.  Eyes: Conjunctivae are normal. Right eye exhibits no discharge. Left eye exhibits no discharge. No scleral icterus.  Neck: Normal range of motion. Neck supple. No JVD present. No tracheal deviation present. No thyromegaly present.  Cardiovascular: Normal rate, regular rhythm, normal heart sounds and intact distal pulses.  Exam reveals no gallop and no friction rub.   No murmur heard. Pulmonary/Chest: Effort normal. No accessory muscle usage or stridor. Not tachypneic. No respiratory distress. She has no decreased breath sounds. She has wheezes in the right lower field and the left lower field. She has no rhonchi. She has no rales.  Abdominal: Soft. Bowel sounds are normal. She exhibits no distension and no mass. There is no tenderness. There is no rebound and no guarding.  Musculoskeletal: Normal range of motion. She exhibits no edema and no tenderness.  Lymphadenopathy:    She has no cervical adenopathy.  Neurological: She is oriented to person, place, and time.  Skin: Skin is warm and dry. No rash noted. She is not diaphoretic. No erythema. No pallor.  Psychiatric: She has a normal mood and affect. Her behavior is normal. Judgment and thought content normal.          Assessment & Plan:

## 2013-05-11 NOTE — Assessment & Plan Note (Signed)
She has not improved much with the inhaler and cough med and has some wheezing today so I gave her an injection of depo-medrol IM.

## 2013-05-22 ENCOUNTER — Encounter: Payer: Self-pay | Admitting: Internal Medicine

## 2013-05-30 ENCOUNTER — Encounter: Payer: Self-pay | Admitting: Internal Medicine

## 2013-05-30 ENCOUNTER — Ambulatory Visit (INDEPENDENT_AMBULATORY_CARE_PROVIDER_SITE_OTHER): Payer: BC Managed Care – PPO | Admitting: Internal Medicine

## 2013-05-30 ENCOUNTER — Other Ambulatory Visit: Payer: PRIVATE HEALTH INSURANCE

## 2013-05-30 VITALS — BP 118/70 | HR 70 | Temp 97.5°F | Resp 16 | Ht 67.0 in | Wt 160.0 lb

## 2013-05-30 DIAGNOSIS — R3129 Other microscopic hematuria: Secondary | ICD-10-CM

## 2013-05-30 DIAGNOSIS — N390 Urinary tract infection, site not specified: Secondary | ICD-10-CM

## 2013-05-30 LAB — POCT URINALYSIS DIPSTICK
BILIRUBIN UA: NEGATIVE
GLUCOSE UA: NEGATIVE
KETONES UA: 5
NITRITE UA: POSITIVE
PH UA: 6
Protein, UA: 30
Spec Grav, UA: 1.025
Urobilinogen, UA: 0.2

## 2013-05-30 MED ORDER — CIPROFLOXACIN HCL 500 MG PO TABS: 500.0000 mg | ORAL_TABLET | Freq: Two times a day (BID) | ORAL | Status: DC

## 2013-05-30 NOTE — Assessment & Plan Note (Signed)
I will check her urine culture and have asked her to have a CT done to see if there are any stones

## 2013-05-30 NOTE — Progress Notes (Signed)
Subjective:    Patient ID: Theresa Harrington, female    DOB: Oct 14, 1955, 58 y.o.   MRN: [id number]  Dysuria  This is a new problem. The current episode started yesterday. The problem occurs every urination. The problem has been unchanged. The quality of the pain is described as burning. The pain is at a severity of 1/10. The patient is experiencing no pain. There has been no fever. The fever has been present for less than 1 day. She is not sexually active. There is a history of pyelonephritis. Associated symptoms include frequency and urgency. Pertinent negatives include no chills, discharge, flank pain, hematuria, hesitancy, nausea, sweats or vomiting. She has tried nothing for the symptoms. The treatment provided no relief. Her past medical history is significant for kidney stones and recurrent UTIs. There is no history of catheterization, a single kidney, urinary stasis or a urological procedure.      Review of Systems  Constitutional: Negative.  Negative for fever, chills, diaphoresis, appetite change and fatigue.  HENT: Negative.   Eyes: Negative.   Respiratory: Negative for cough, chest tightness, shortness of breath, wheezing and stridor.   Cardiovascular: Negative.  Negative for chest pain, palpitations and leg swelling.  Gastrointestinal: Negative.  Negative for nausea, vomiting and abdominal pain.  Endocrine: Negative.   Genitourinary: Positive for dysuria, urgency and frequency. Negative for hesitancy, hematuria, flank pain, decreased urine volume, vaginal bleeding, enuresis, difficulty urinating, vaginal pain and dyspareunia.  Musculoskeletal: Negative.   Allergic/Immunologic: Negative.   Neurological: Negative.   Hematological: Negative.  Negative for adenopathy. Does not bruise/bleed easily.  Psychiatric/Behavioral: Negative.        Objective:   Physical Exam  Vitals reviewed. Constitutional: She is oriented to person, place, and time. She appears well-developed and  well-nourished.  Non-toxic appearance. She does not have a sickly appearance. She does not appear ill. No distress.  HENT:  Head: Normocephalic and atraumatic.  Mouth/Throat: Oropharynx is clear and moist. No oropharyngeal exudate.  Eyes: Conjunctivae are normal. Right eye exhibits no discharge. Left eye exhibits no discharge. No scleral icterus.  Neck: Normal range of motion. Neck supple. No JVD present. No tracheal deviation present. No thyromegaly present.  Cardiovascular: Normal rate, regular rhythm, normal heart sounds and intact distal pulses.  Exam reveals no gallop and no friction rub.   No murmur heard. Pulmonary/Chest: Effort normal and breath sounds normal. No stridor. No respiratory distress. She has no wheezes. She has no rales. She exhibits no tenderness.  Abdominal: Soft. Normal appearance and bowel sounds are normal. She exhibits no distension and no mass. There is no hepatosplenomegaly. There is no tenderness. There is no rebound, no guarding and no CVA tenderness.  Musculoskeletal: Normal range of motion. She exhibits no edema and no tenderness.  Lymphadenopathy:    She has no cervical adenopathy.  Neurological: She is oriented to person, place, and time.  Skin: Skin is warm and dry. No rash noted. She is not diaphoretic. No erythema. No pallor.     Lab Results  Component Value Date   WBC 12.3* 08/06/2012   HGB 12.7 08/06/2012   HCT 37.5 08/06/2012   PLT 264.0 08/06/2012   GLUCOSE 149* 02/28/2013   CHOL 137 08/06/2012   TRIG 103.0 08/06/2012   HDL 53.60 08/06/2012   LDLDIRECT 160.2 03/01/2010   LDLCALC 63 08/06/2012   ALT 17 10/07/2012   AST 18 10/07/2012   NA 138 02/28/2013   K 3.9 02/28/2013   CL 104 02/28/2013   CREATININE 0.7  02/28/2013   BUN 13 02/28/2013   CO2 27 02/28/2013   TSH 0.42 02/28/2013   HGBA1C 8.5* 02/28/2013   MICROALBUR 3.5* 03/01/2010       Assessment & Plan:

## 2013-05-30 NOTE — Assessment & Plan Note (Signed)
I will treat with cipro and will check a urine culture I have also asked her to have a CT scan done to see if there is a stone, mass, or obstructing lesion that may be causing her to have recurrent infections

## 2013-06-01 DIAGNOSIS — K8020 Calculus of gallbladder without cholecystitis without obstruction: Secondary | ICD-10-CM

## 2013-06-01 HISTORY — DX: Calculus of gallbladder without cholecystitis without obstruction: K80.20

## 2013-06-01 LAB — CULTURE, URINE COMPREHENSIVE

## 2013-06-03 ENCOUNTER — Other Ambulatory Visit: Payer: Self-pay

## 2013-06-03 ENCOUNTER — Inpatient Hospital Stay: Admission: RE | Admit: 2013-06-03 | Payer: BC Managed Care – PPO | Source: Ambulatory Visit

## 2013-06-03 DIAGNOSIS — Z1231 Encounter for screening mammogram for malignant neoplasm of breast: Secondary | ICD-10-CM

## 2013-06-04 ENCOUNTER — Encounter (HOSPITAL_COMMUNITY): Payer: BC Managed Care – PPO | Admitting: Registered Nurse

## 2013-06-04 ENCOUNTER — Other Ambulatory Visit: Payer: Self-pay | Admitting: Internal Medicine

## 2013-06-04 ENCOUNTER — Ambulatory Visit (INDEPENDENT_AMBULATORY_CARE_PROVIDER_SITE_OTHER)
Admission: RE | Admit: 2013-06-04 | Discharge: 2013-06-04 | Disposition: A | Discharge status: Home / Self Care | Payer: BC Managed Care – PPO | Source: Ambulatory Visit | Attending: Internal Medicine | Admitting: Internal Medicine

## 2013-06-04 ENCOUNTER — Emergency Department (HOSPITAL_COMMUNITY): Payer: BC Managed Care – PPO

## 2013-06-04 ENCOUNTER — Emergency Department (HOSPITAL_COMMUNITY): Payer: BC Managed Care – PPO | Admitting: Registered Nurse

## 2013-06-04 ENCOUNTER — Observation Stay (HOSPITAL_COMMUNITY)
Admission: EM | Admit: 2013-06-04 | Discharge: 2013-06-05 | Disposition: A | Discharge status: Home / Self Care | Payer: BC Managed Care – PPO | Attending: Urology | Admitting: Urology

## 2013-06-04 ENCOUNTER — Encounter (HOSPITAL_COMMUNITY): Payer: Self-pay | Admitting: Emergency Medicine

## 2013-06-04 ENCOUNTER — Encounter (HOSPITAL_COMMUNITY)
Admission: EM | Disposition: A | Discharge status: Home / Self Care | Payer: Self-pay | Source: Home / Self Care | Attending: Emergency Medicine

## 2013-06-04 DIAGNOSIS — R319 Hematuria, unspecified: Secondary | ICD-10-CM

## 2013-06-04 DIAGNOSIS — N200 Calculus of kidney: Secondary | ICD-10-CM

## 2013-06-04 DIAGNOSIS — N12 Tubulo-interstitial nephritis, not specified as acute or chronic: Secondary | ICD-10-CM

## 2013-06-04 DIAGNOSIS — E119 Type 2 diabetes mellitus without complications: Secondary | ICD-10-CM | POA: Insufficient documentation

## 2013-06-04 DIAGNOSIS — Z87440 Personal history of urinary (tract) infections: Secondary | ICD-10-CM | POA: Insufficient documentation

## 2013-06-04 DIAGNOSIS — E785 Hyperlipidemia, unspecified: Secondary | ICD-10-CM | POA: Insufficient documentation

## 2013-06-04 DIAGNOSIS — N201 Calculus of ureter: Principal | ICD-10-CM | POA: Diagnosis present

## 2013-06-04 DIAGNOSIS — R3129 Other microscopic hematuria: Secondary | ICD-10-CM

## 2013-06-04 DIAGNOSIS — Z881 Allergy status to other antibiotic agents status: Secondary | ICD-10-CM | POA: Insufficient documentation

## 2013-06-04 DIAGNOSIS — E039 Hypothyroidism, unspecified: Secondary | ICD-10-CM | POA: Insufficient documentation

## 2013-06-04 DIAGNOSIS — N1330 Unspecified hydronephrosis: Secondary | ICD-10-CM

## 2013-06-04 DIAGNOSIS — Z9884 Bariatric surgery status: Secondary | ICD-10-CM | POA: Insufficient documentation

## 2013-06-04 DIAGNOSIS — N390 Urinary tract infection, site not specified: Secondary | ICD-10-CM

## 2013-06-04 DIAGNOSIS — I6529 Occlusion and stenosis of unspecified carotid artery: Secondary | ICD-10-CM | POA: Insufficient documentation

## 2013-06-04 DIAGNOSIS — D649 Anemia, unspecified: Secondary | ICD-10-CM | POA: Insufficient documentation

## 2013-06-04 DIAGNOSIS — Z880 Allergy status to penicillin: Secondary | ICD-10-CM | POA: Insufficient documentation

## 2013-06-04 DIAGNOSIS — I10 Essential (primary) hypertension: Secondary | ICD-10-CM | POA: Insufficient documentation

## 2013-06-04 DIAGNOSIS — I739 Peripheral vascular disease, unspecified: Secondary | ICD-10-CM | POA: Insufficient documentation

## 2013-06-04 DIAGNOSIS — K219 Gastro-esophageal reflux disease without esophagitis: Secondary | ICD-10-CM | POA: Insufficient documentation

## 2013-06-04 DIAGNOSIS — Z888 Allergy status to other drugs, medicaments and biological substances status: Secondary | ICD-10-CM | POA: Insufficient documentation

## 2013-06-04 DIAGNOSIS — Z87891 Personal history of nicotine dependence: Secondary | ICD-10-CM | POA: Insufficient documentation

## 2013-06-04 HISTORY — PX: CYSTOSCOPY WITH STENT PLACEMENT: SHX5790

## 2013-06-04 LAB — CBC WITH DIFFERENTIAL/PLATELET
BASOS ABS: 0 10*3/uL (ref 0.0–0.1)
BASOS PCT: 0 % (ref 0–1)
Eosinophils Absolute: 0.1 10*3/uL (ref 0.0–0.7)
Eosinophils Relative: 1 % (ref 0–5)
HCT: 37.6 % (ref 36.0–46.0)
Hemoglobin: 12.1 g/dL (ref 12.0–15.0)
LYMPHS PCT: 39 % (ref 12–46)
Lymphs Abs: 3.8 10*3/uL (ref 0.7–4.0)
MCH: 26.5 pg (ref 26.0–34.0)
MCHC: 32.2 g/dL (ref 30.0–36.0)
MCV: 82.5 fL (ref 78.0–100.0)
MONO ABS: 0.5 10*3/uL (ref 0.1–1.0)
Monocytes Relative: 5 % (ref 3–12)
NEUTROS ABS: 5.5 10*3/uL (ref 1.7–7.7)
NEUTROS PCT: 55 % (ref 43–77)
PLATELETS: 286 10*3/uL (ref 150–400)
RBC: 4.56 MIL/uL (ref 3.87–5.11)
RDW: 14.2 % (ref 11.5–15.5)
WBC: 10 10*3/uL (ref 4.0–10.5)

## 2013-06-04 LAB — URINALYSIS, ROUTINE W REFLEX MICROSCOPIC
Bilirubin Urine: NEGATIVE
Glucose, UA: >1000 mg/dL — AB
KETONES UR: NEGATIVE mg/dL
NITRITE: NEGATIVE
PH: 6.5 (ref 5.0–8.0)
PROTEIN: NEGATIVE mg/dL
Specific Gravity, Urine: 1.025 (ref 1.005–1.030)
Urobilinogen, UA: 0.2 mg/dL (ref 0.0–1.0)

## 2013-06-04 LAB — BASIC METABOLIC PANEL
BUN: 20 mg/dL (ref 6–23)
CHLORIDE: 96 meq/L (ref 96–112)
CO2: 31 meq/L (ref 19–32)
CREATININE: 0.87 mg/dL (ref 0.50–1.10)
Calcium: 10.8 mg/dL — ABNORMAL HIGH (ref 8.4–10.5)
GFR calc non Af Amer: 73 mL/min — ABNORMAL LOW (ref >90)
GFR, EST AFRICAN AMERICAN: 85 mL/min — AB (ref >90)
Glucose, Bld: 135 mg/dL — ABNORMAL HIGH (ref 70–99)
Potassium: 4.5 mEq/L (ref 3.7–5.3)
SODIUM: 138 meq/L (ref 137–147)

## 2013-06-04 LAB — GLUCOSE, CAPILLARY
GLUCOSE-CAPILLARY: 58 mg/dL — AB (ref 70–99)
Glucose-Capillary: 59 mg/dL — ABNORMAL LOW (ref 70–99)

## 2013-06-04 LAB — URINE MICROSCOPIC-ADD ON

## 2013-06-04 SURGERY — CYSTOSCOPY, WITH STENT INSERTION
Anesthesia: General | Site: Ureter | Laterality: Right

## 2013-06-04 MED ORDER — ACETAMINOPHEN 160 MG/5ML PO SOLN
950.0000 mg | Freq: Once | ORAL | Status: DC
  Filled 2013-06-04: qty 30

## 2013-06-04 MED ORDER — DEXAMETHASONE SODIUM PHOSPHATE 10 MG/ML IJ SOLN
INTRAMUSCULAR | Status: DC | PRN
  Administered 2013-06-04: 10 mg via INTRAVENOUS

## 2013-06-04 MED ORDER — ACETAMINOPHEN 325 MG PO TABS: 325.0000 mg | ORAL_TABLET | Freq: Once | ORAL | Status: DC

## 2013-06-04 MED ORDER — ONDANSETRON HCL 4 MG/2ML IJ SOLN
INTRAMUSCULAR | Status: DC | PRN
  Administered 2013-06-04: 4 mg via INTRAVENOUS

## 2013-06-04 MED ORDER — PROPOFOL 10 MG/ML IV BOLUS
INTRAVENOUS | Status: AC
  Filled 2013-06-04: qty 20

## 2013-06-04 MED ORDER — SCOPOLAMINE 1 MG/3DAYS TD PT72
MEDICATED_PATCH | TRANSDERMAL | Status: DC | PRN
  Administered 2013-06-04: 1 via TRANSDERMAL

## 2013-06-04 MED ORDER — SODIUM CHLORIDE 0.9 % IV SOLN
Freq: Once | INTRAVENOUS | Status: AC
  Administered 2013-06-04: 20:00:00 via INTRAVENOUS

## 2013-06-04 MED ORDER — FENTANYL CITRATE 0.05 MG/ML IJ SOLN: 25.0000 ug | INTRAMUSCULAR | Status: DC | PRN

## 2013-06-04 MED ORDER — SODIUM CHLORIDE 0.9 % IR SOLN
Status: DC | PRN
  Administered 2013-06-04: 2000 mL

## 2013-06-04 MED ORDER — SODIUM CHLORIDE 0.9 % IR SOLN
Status: DC | PRN
  Administered 2013-06-04: 1000 mL

## 2013-06-04 MED ORDER — OXYBUTYNIN CHLORIDE 5 MG PO TABS: 5.0000 mg | ORAL_TABLET | Freq: Three times a day (TID) | ORAL | Status: DC

## 2013-06-04 MED ORDER — SCOPOLAMINE 1 MG/3DAYS TD PT72
MEDICATED_PATCH | TRANSDERMAL | Status: AC
  Filled 2013-06-04: qty 1

## 2013-06-04 MED ORDER — LIDOCAINE HCL (CARDIAC) 20 MG/ML IV SOLN
INTRAVENOUS | Status: DC | PRN
  Administered 2013-06-04: 80 mg via INTRAVENOUS

## 2013-06-04 MED ORDER — DEXTROSE 5 % IV SOLN
1.0000 g | Freq: Once | INTRAVENOUS | Status: AC
Start: 2013-06-04 — End: 2013-06-04
  Administered 2013-06-04: 1 g via INTRAVENOUS
  Filled 2013-06-04: qty 10

## 2013-06-04 MED ORDER — DEXAMETHASONE SODIUM PHOSPHATE 10 MG/ML IJ SOLN
INTRAMUSCULAR | Status: AC
  Filled 2013-06-04: qty 1

## 2013-06-04 MED ORDER — MIDAZOLAM HCL 2 MG/2ML IJ SOLN
INTRAMUSCULAR | Status: AC
  Filled 2013-06-04: qty 2

## 2013-06-04 MED ORDER — LEVOFLOXACIN 500 MG PO TABS: 500.0000 mg | ORAL_TABLET | Freq: Every day | ORAL | Status: DC

## 2013-06-04 MED ORDER — LACTATED RINGERS IV SOLN
INTRAVENOUS | Status: DC | PRN
  Administered 2013-06-04: 22:00:00 via INTRAVENOUS

## 2013-06-04 MED ORDER — EPHEDRINE SULFATE 50 MG/ML IJ SOLN
INTRAMUSCULAR | Status: DC | PRN
  Administered 2013-06-04: 5 mg via INTRAVENOUS
  Administered 2013-06-04: 10 mg via INTRAVENOUS

## 2013-06-04 MED ORDER — MIDAZOLAM HCL 5 MG/5ML IJ SOLN
INTRAMUSCULAR | Status: DC | PRN
  Administered 2013-06-04: 2 mg via INTRAVENOUS

## 2013-06-04 MED ORDER — ONDANSETRON HCL 4 MG/2ML IJ SOLN
INTRAMUSCULAR | Status: AC
  Filled 2013-06-04: qty 2

## 2013-06-04 MED ORDER — FENTANYL CITRATE 0.05 MG/ML IJ SOLN
INTRAMUSCULAR | Status: DC | PRN
  Administered 2013-06-04: 50 ug via INTRAVENOUS

## 2013-06-04 MED ORDER — PROPOFOL 10 MG/ML IV BOLUS
INTRAVENOUS | Status: DC | PRN
  Administered 2013-06-04: 200 mg via INTRAVENOUS

## 2013-06-04 MED ORDER — FENTANYL CITRATE 0.05 MG/ML IJ SOLN
INTRAMUSCULAR | Status: AC
  Filled 2013-06-04: qty 2

## 2013-06-04 MED ORDER — LIDOCAINE HCL (CARDIAC) 20 MG/ML IV SOLN
INTRAVENOUS | Status: AC
  Filled 2013-06-04: qty 5

## 2013-06-04 MED ORDER — PROPOFOL INFUSION 10 MG/ML OPTIME
INTRAVENOUS | Status: DC | PRN
  Administered 2013-06-04: 100 ug/kg/min via INTRAVENOUS

## 2013-06-04 SURGICAL SUPPLY — 15 items
BAG URO CATCHER STRL LF (DRAPE) ×3 IMPLANT
CATH URET 5FR 28IN CONE TIP (BALLOONS)
CATH URET 5FR 70CM CONE TIP (BALLOONS) ×1 IMPLANT
CLOTH BEACON ORANGE TIMEOUT ST (SAFETY) ×3 IMPLANT
DRAPE CAMERA CLOSED 9X96 (DRAPES) ×3 IMPLANT
GLOVE BIOGEL M 8.0 STRL (GLOVE) ×3 IMPLANT
GOWN STRL REUS W/ TWL XL LVL3 (GOWN DISPOSABLE) ×1 IMPLANT
GOWN STRL REUS W/TWL XL LVL3 (GOWN DISPOSABLE) ×5 IMPLANT
GUIDEWIRE STR DUAL SENSOR (WIRE) ×3 IMPLANT
MANIFOLD NEPTUNE II (INSTRUMENTS) ×3 IMPLANT
PACK CYSTO (CUSTOM PROCEDURE TRAY) ×3 IMPLANT
STENT CONTOUR 6FRX24X.038 (STENTS) ×2 IMPLANT
TUBING CONNECTING 10 (TUBING) ×2 IMPLANT
TUBING CONNECTING 10' (TUBING) ×1
WIRE COONS/BENSON .038X145CM (WIRE) ×1 IMPLANT

## 2013-06-04 NOTE — Progress Notes (Signed)
Given sm amt to glucose 15, (pt dislikes) and given peanut butter graham crackers and ginger ale.

## 2013-06-04 NOTE — ED Notes (Signed)
Pt states she was sent here by Dr [name] at [place] 2 large kidney stones that have damaged her right kidney  He wants her evaluated by a urologist due to the stones and an infection  Pt states she had a CT scan today

## 2013-06-04 NOTE — Preoperative (Signed)
Beta Blockers   Reason not to administer Beta Blockers:Not Applicable 

## 2013-06-04 NOTE — Transfer of Care (Signed)
Immediate Anesthesia Transfer of Care Note  Patient: Theresa Harrington  Procedure(s) Performed: Procedure(s): CYSTOSCOPY WITH STENT PLACEMENT (Right)  Patient Location: PACU  Anesthesia Type:General  Level of Consciousness: awake, alert , oriented and patient cooperative  Airway & Oxygen Therapy: Patient Spontanous Breathing and Patient connected to face mask oxygen  Post-op Assessment: Report given to PACU RN, Post -op Vital signs reviewed and stable and Patient moving all extremities  Post vital signs: Reviewed and stable  Complications: No apparent anesthesia complications

## 2013-06-04 NOTE — ED Provider Notes (Signed)
Medical screening examination/treatment/procedure(s) were conducted as a shared visit with non-physician practitioner(s) and myself.  I personally evaluated the patient during the encounter.     Patient sent by PCP for CT results from a few days ago of emphysematous pyelonephritis. She also has stones present. Patient denies fever, N/V, relaxing comfortably. Belly benign. Urology consulted and will stent tonight. Admitted.  Osvaldo Shipper, MD 06/04/13 [id number]

## 2013-06-04 NOTE — Discharge Instructions (Signed)

## 2013-06-04 NOTE — Anesthesia Postprocedure Evaluation (Signed)
  Anesthesia Post-op Note  Patient: Theresa Harrington  Procedure(s) Performed: Procedure(s): CYSTOSCOPY WITH STENT PLACEMENT (Right) Patient is awake and responsive. Pain and nausea are reasonably well controlled. Vital signs are stable and clinically acceptable. Oxygen saturation is clinically acceptable. There are no apparent anesthetic complications at this time. Patient is ready for discharge.

## 2013-06-04 NOTE — Anesthesia Preprocedure Evaluation (Addendum)
Anesthesia Evaluation  Patient identified by MRN, date of birth, ID band Patient awake    Reviewed: Allergy & Precautions, H&P , Patient's Chart, lab work & pertinent test results, reviewed documented beta blocker date and time   Airway Mallampati: II TM Distance: >3 FB Neck ROM: full    Dental no notable dental hx.    Pulmonary former smoker,  breath sounds clear to auscultation  Pulmonary exam normal       Cardiovascular hypertension, On Medications Rhythm:regular Rate:Normal     Neuro/Psych    GI/Hepatic GERD-  Medicated,  Endo/Other  diabetes, Type 2Hyperthyroidism   Renal/GU      Musculoskeletal   Abdominal   Peds  Hematology   Anesthesia Other Findings   Reproductive/Obstetrics                         Anesthesia Physical Anesthesia Plan  ASA: II and emergent  Anesthesia Plan: General   Post-op Pain Management:    Induction: Intravenous  Airway Management Planned: LMA  Additional Equipment:   Intra-op Plan:   Post-operative Plan: Extubation in OR  Informed Consent: I have reviewed the patients History and Physical, chart, labs and discussed the procedure including the risks, benefits and alternatives for the proposed anesthesia with the patient or authorized representative who has indicated his/her understanding and acceptance.   Dental Advisory Given and Dental advisory given  Plan Discussed with: CRNA and Surgeon  Anesthesia Plan Comments: (  Discussed general anesthesia, including possible nausea, instrumentation of airway, sore throat,pulmonary aspiration, etc. I asked if the were any outstanding questions, or  concerns before we proceeded. )        Anesthesia Quick Evaluation

## 2013-06-04 NOTE — ED Provider Notes (Signed)
CSN: [id number]     Arrival date & time 06/04/13  1843 History   First MD Initiated Contact with Patient 06/04/13 1931     Chief Complaint  Patient presents with  . Flank Pain   (Consider location/radiation/quality/duration/timing/severity/associated sxs/prior Treatment) The history is provided by the patient and medical records.   This is a 59 year old female with past medical history significant for hypertension, diabetes, hypothyroidism, hyperlipidemia, presenting to the ED at the request of her PCP for right renal stones.  Patient saw her PCP last week for urinary symptoms, was treated for a UTI and had CT performed earlier today. She was called afterwards and encouraged to come to the ED for urgent evaluation by urology. Patient states right flank pain is minimal at this time is described as more of a "annoyance" than anything else. She denies any associated nausea, vomiting, fevers, or chills.  She has a prior hx of renal stones requiring lithotripsy and stenting.  Also notes hx of recurrent UTIs.  States urinary sx have resolved since starting abx.  VS stable on arrival.  Past Medical History  Diagnosis Date  . Anemia   . Hyperlipidemia   . Hypertension   . Hypothyroidism   . Diabetes mellitus     type II  . Peripheral vascular disease    Past Surgical History  Procedure Laterality Date  . Gastric bypass    . Hand surgery      right hand  . Knee surgery      right   . Breast surgery      breast lift/bil  . Thigh lift      Bil  . Gastric banding port revision      2007  . Belpharoptosis repair      eyelid lift   Family History  Problem Relation Age of Onset  . Arthritis Other   . Cancer Other     colon  . Hypertension Other   . Stroke Other   . Diabetes Father   . Heart disease Father   . Diabetes Sister   . Heart disease Sister   . Diabetes Brother   . Heart disease Brother   . Colon cancer Maternal Aunt   . Diabetes Brother   . Diabetes Brother   . Kidney  disease Mother    History  Substance Use Topics  . Smoking status: Former Smoker -- 20 years    Quit date: 09/06/2002  . Smokeless tobacco: Never Used  . Alcohol Use: No   OB History   Grav Para Term Preterm Abortions TAB SAB Ect Mult Living                 Review of Systems  Genitourinary: Positive for flank pain.  All other systems reviewed and are negative.    Allergies  Ampicillin; Codeine; Lisinopril; Penicillins; and Januvia  Home Medications   Current Outpatient Rx  Name  Route  Sig  Dispense  Refill  . atorvastatin (LIPITOR) 80 MG tablet      TAKE ONE TABLET BY MOUTH EVERY DAY   90 tablet   3   . budesonide-formoterol (SYMBICORT) 160-4.5 MCG/ACT inhaler   Inhalation   Inhale 2 puffs into the lungs 2 (two) times daily.   1 Inhaler   12   . cetirizine (ZYRTEC) 10 MG tablet   Oral   Take 1 tablet (10 mg total) by mouth daily.   90 tablet   3   . chlorpheniramine-HYDROcodone (TUSSIONEX) 10-8 MG/5ML [facility]  Oral   Take 5 mLs by mouth every 12 (twelve) hours as needed.   140 mL   0   . cilostazol (PLETAL) 100 MG tablet   Oral   Take 1 tablet (100 mg total) by mouth 2 (two) times daily.   180 tablet   3   . ciprofloxacin (CIPRO) 500 MG tablet   Oral   Take 1 tablet (500 mg total) by mouth 2 (two) times daily.   10 tablet   2   . fluticasone (FLONASE) 50 MCG/ACT nasal spray   Nasal   Place 2 sprays into the nose daily.   16 g   6   . glyBURIDE (DIABETA) 5 MG tablet      TAKE ONE TABLET BY MOUTH ONCE DAILY   90 tablet   1   . INVOKANA 100 MG TABS      TAKE ONE TABLET BY MOUTH EVERY DAY   90 tablet   3   . losartan (COZAAR) 100 MG tablet   Oral   Take 1 tablet (100 mg total) by mouth daily.   90 tablet   3   . metFORMIN (GLUCOPHAGE) 1000 MG tablet      TAKE ONE TABLET BY MOUTH TWICE DAILY WITH MEALS   180 tablet   0   . metFORMIN (GLUCOPHAGE) 1000 MG tablet      TAKE ONE TABLET BY MOUTH TWICE DAILY WITH MEALS   180 tablet    1   . pantoprazole (PROTONIX) 40 MG tablet      TAKE ONE TABLET BY MOUTH ONCE DAILY   90 tablet   3   . sertraline (ZOLOFT) 50 MG tablet   Oral   Take 1 tablet (50 mg total) by mouth daily.   90 tablet   1    BP 151/56  Pulse 69  Temp(Src) 97.6 F (36.4 C) (Oral)  Resp 20  SpO2 98%  Physical Exam  Nursing note and vitals reviewed. Constitutional: She is oriented to person, place, and time. She appears well-developed and well-nourished.  HENT:  Head: Normocephalic and atraumatic.  Mouth/Throat: Oropharynx is clear and moist.  Eyes: Conjunctivae and EOM are normal. Pupils are equal, round, and reactive to light.  Neck: Normal range of motion.  Cardiovascular: Normal rate, regular rhythm and normal heart sounds.   Pulmonary/Chest: Effort normal and breath sounds normal.  Abdominal: Soft. Bowel sounds are normal. There is no tenderness. There is no guarding and no CVA tenderness.  No CVA tenderness on exam  Musculoskeletal: Normal range of motion.  Neurological: She is alert and oriented to person, place, and time.  Skin: Skin is warm and dry.  Psychiatric: She has a normal mood and affect.    ED Course  Procedures (including critical care time) Labs Review Labs Reviewed - No data to display Imaging Review Ct Abdomen Pelvis Wo Contrast  06/04/2013   CLINICAL DATA:  Right flank pain for 1 week.  EXAM: CT ABDOMEN AND PELVIS WITHOUT CONTRAST  TECHNIQUE: Multidetector CT imaging of the abdomen and pelvis was performed following the standard protocol without intravenous contrast.  COMPARISON:  12/29/2010  FINDINGS: There is no pleural effusion identified. The lung bases appear clear. There is no focal liver abnormality identified. The gallbladder appears collapsed around multiple small calcified stones. No pericholecystic fluid identified. No biliary dilatation. Normal appearance of the pancreas. The spleen is unremarkable.  The adrenal glands both appear normal. There is moderate  right-sided hydronephrosis. Within the proximal right  ureter there are 2 adjacent obstructing stones. Together these measure 9 mm, image 49/ series 3. Within the dependent portion of the dilated right renal pelvis there is a stone measuring 5 mm. There is moderate right-sided hydronephrosis. Multiple foci of gas are identified within the proximal right renal collecting system which may indicate the presence of an aerobic infection. Cyst within the midpole of left kidney is incompletely characterized without IV contrast. No left renal stones are evidence of left-sided obstructive uropathy. The urinary bladder is unremarkable. Uterus and the adnexal structures are unremarkable.  There is calcified atherosclerotic disease affecting the abdominal aorta. No aneurysm. There is no upper abdominal adenopathy. No pelvic or inguinal adenopathy identified. No free fluid or fluid collections identified.  Gastric band is in place. The bowel loops within the abdomen and pelvis appear normal.  Review of the visualized osseous structures is significant for lumbar spondylosis. No aggressive lytic or sclerotic bone lesions identified.  IMPRESSION: 1. High-grade right-sided obstructive uropathy. There are 2 obstructing stones identified within the proximal right ureter. 2. Multiple foci of gas are identified within the dilated proximal right renal collecting system. In the absence of recent instrumentation this is a finding worrisome for anaerobic infection. 3. Gallstones. 4. Atherosclerotic disease. 5. Status post gastric banding.   Electronically Signed   By: Kerby Moors M.D.   On: 06/04/2013 15:58    EKG Interpretation   None       MDM   1. Emphysematous pyelonephritis of right kidney   2. Kidney stone on right side   3. Hydronephrosis, right   4. Hematuria    Labs as above-- no leukocytosis, renal function preserved.  U/a with trace blood, nitrite negative.  CT from earlier today revealing obstructive stones and  emphysematous pyelo, dose of IV rocephin given.  Discussed with Dr. Diona Fanti, will come to the ED and evaluate pt.  Dr. Diona Fanti has evaluated pt in the ED, will take to OR for stent placement.  Asked to obtain pre-op CXR and EKG.  VS remain stable.  Larene Pickett, PA-C 06/04/13 2221

## 2013-06-04 NOTE — Op Note (Signed)
Preoperative diagnosis: Obstructing, infected right ureteral stone   Postoperative diagnosis:same   Procedure: Cystoscopy, right double-J stent placement    Surgeon: Lillette Boxer. Jamesmichael Shadd, M.D.   Anesthesia: Gen.   Complications: None  Specimen(s): Urine specimen for culture from right renal pelvis  Drain(s): 7 French by 24 cm contour stent without tether  Indications: 58 year old female who presented the emergency room, sent by Dr. Scarlette Calico for management of an obstructing right ureteral stone with gas in the collecting system. She was remarkably stable, and in minimal pain. However, with a history of recurrent urinary tract infections, an obstructing 9-10 mm right ureteral stone and gas in the collecting system, I instructed the patient that she needs emergent stent placement, antibiotic management, and eventual management of her ureteral stone. She presents at this time for urgent decompression of her right renal collecting system.   Technique and findings: The patient was properly identified and marked. She received IV is Rocephin in the emergency room. She was taken the operating room where general anesthetic was administered with the LMA. She was placed in the dorsolithotomy position. Genitalia and perineum were prepped and draped. Proper timeout was then performed. I passed a 22 French panendoscope in her bladder which was circumferentially inspected and found to be normal. Both ureteral orifices were normal in configuration and location. The right ureter was cannulated with the 0.038 inch sensor-tipped guidewire, which was eventually negotiated past the obstructing stone. In order to assure proper position of the guidewire, I passed a 6 [place] open-ended catheter over top of the guidewire, passed the stone, and aspirated. Mildly bloody urine was aspirated. About 6 cc of this was sent for urine culture labeled right renal pelvic urine. I then passed the guidewire back up the open-ended  catheter, and removed the open-ended catheter. Over this, I then passed a 24 centimeter by 7 French double-J stent. Good proximal distal curls were seen radiographically and cystoscopically following removal of the guidewire. The bladder was drained and the procedure terminated. The patient was awakened and taken to PACU in stable condition. I will keep her here at least overnight for observation and antibiotic management.

## 2013-06-04 NOTE — H&P (Signed)
H&P  Chief Complaint: Kidney stone  History of Present Illness: Theresa Harrington is a 58 y.o. year old female sent to the emergency room today by Dr. Scarlette Calico for evaluation and further management of a right sided ureteral stone. She has a history of recurring urinary tract infections. Recently, she has had more foul-smelling urine, as well as right flank pain. The pain started several days ago, prior to a trip to [place] with her niece. Because of her symptoms, she had a CT scan today which revealed a large proximal/mid right ureteral stone with gas within the collecting system, consistent with a gas-forming organism. She has had no significant fever, chills, gross hematuria, dysuria, nausea or vomiting. She does have a prior history of kidney stones, being treated by Dr. Lowella Bandy of our practice.  Past Medical History  Diagnosis Date  . Anemia   . Hyperlipidemia   . Hypertension   . Hypothyroidism   . Diabetes mellitus     type II  . Peripheral vascular disease     Past Surgical History  Procedure Laterality Date  . Gastric bypass    . Hand surgery      right hand  . Knee surgery      right   . Breast surgery      breast lift/bil  . Thigh lift      Bil  . Gastric banding port revision      2007  . Belpharoptosis repair      eyelid lift    Home Medications:   (Not in a hospital admission)  Allergies:  Allergies  Allergen Reactions  . Ampicillin Nausea And Vomiting  . Codeine   . Lisinopril     REACTION: cough  . Penicillins Nausea And Vomiting  . Januvia [Sitagliptin]     nausea    Family History  Problem Relation Age of Onset  . Arthritis Other   . Cancer Other     colon  . Hypertension Other   . Stroke Other   . Diabetes Father   . Heart disease Father   . Diabetes Sister   . Heart disease Sister   . Diabetes Brother   . Heart disease Brother   . Colon cancer Maternal Aunt   . Diabetes Brother   . Diabetes Brother   . Kidney disease Mother      Social History:  reports that she quit smoking about 10 years ago. She has never used smokeless tobacco. She reports that she does not drink alcohol or use illicit drugs.  ROS: A complete review of systems was performed.  All systems are negative except for pertinent findings as noted.  Physical Exam:  Vital signs in last 24 hours: Temp:  [97.6 F (36.4 C)] 97.6 F (36.4 C) (02/04 1911) Pulse Rate:  [69] 69 (02/04 1911) Resp:  [20] 20 (02/04 1911) BP: (151)/(56) 151/56 mmHg (02/04 1911) SpO2:  [98 %] 98 % (02/04 1911) General:  Alert and oriented, No acute distress HEENT: Normocephalic, atraumatic Neck: No JVD or lymphadenopathy Cardiovascular: Regular rate and rhythm Lungs: Clear bilaterally Abdomen: Soft, nontender, nondistended, no abdominal masses. There is no CVA tenderness. No rebound or guarding noted. Back: No CVA tenderness Extremities: No edema Neurologic: Grossly intact  Laboratory Data:  Results for orders placed during the hospital encounter of 06/04/13 (from the past 24 hour(s))  URINALYSIS, ROUTINE W REFLEX MICROSCOPIC     Status: Abnormal   Collection Time    06/04/13  7:49 PM  Result Value Range   Color, Urine YELLOW  YELLOW   APPearance CLEAR  CLEAR   Specific Gravity, Urine 1.025  1.005 - 1.030   pH 6.5  5.0 - 8.0   Glucose, UA >1000 (*) NEGATIVE mg/dL   Hgb urine dipstick TRACE (*) NEGATIVE   Bilirubin Urine NEGATIVE  NEGATIVE   Ketones, ur NEGATIVE  NEGATIVE mg/dL   Protein, ur NEGATIVE  NEGATIVE mg/dL   Urobilinogen, UA 0.2  0.0 - 1.0 mg/dL   Nitrite NEGATIVE  NEGATIVE   Leukocytes, UA SMALL (*) NEGATIVE  URINE MICROSCOPIC-ADD ON     Status: None   Collection Time    06/04/13  7:49 PM      Result Value Range   Squamous Epithelial / LPF RARE  RARE   WBC, UA 7-10  <3 WBC/hpf   RBC / HPF 11-20  <3 RBC/hpf   Bacteria, UA RARE  RARE  CBC WITH DIFFERENTIAL     Status: None   Collection Time    06/04/13  7:51 PM      Result Value Range    WBC 10.0  4.0 - 10.5 K/uL   RBC 4.56  3.87 - 5.11 MIL/uL   Hemoglobin 12.1  12.0 - 15.0 g/dL   HCT 37.6  36.0 - 46.0 %   MCV 82.5  78.0 - 100.0 fL   MCH 26.5  26.0 - 34.0 pg   MCHC 32.2  30.0 - 36.0 g/dL   RDW 14.2  11.5 - 15.5 %   Platelets 286  150 - 400 K/uL   Neutrophils Relative % 55  43 - 77 %   Neutro Abs 5.5  1.7 - 7.7 K/uL   Lymphocytes Relative 39  12 - 46 %   Lymphs Abs 3.8  0.7 - 4.0 K/uL   Monocytes Relative 5  3 - 12 %   Monocytes Absolute 0.5  0.1 - 1.0 K/uL   Eosinophils Relative 1  0 - 5 %   Eosinophils Absolute 0.1  0.0 - 0.7 K/uL   Basophils Relative 0  0 - 1 %   Basophils Absolute 0.0  0.0 - 0.1 K/uL  BASIC METABOLIC PANEL     Status: Abnormal   Collection Time    06/04/13  7:51 PM      Result Value Range   Sodium 138  137 - 147 mEq/L   Potassium 4.5  3.7 - 5.3 mEq/L   Chloride 96  96 - 112 mEq/L   CO2 31  19 - 32 mEq/L   Glucose, Bld 135 (*) 70 - 99 mg/dL   BUN 20  6 - 23 mg/dL   Creatinine, Ser 0.87  0.50 - 1.10 mg/dL   Calcium 10.8 (*) 8.4 - 10.5 mg/dL   GFR calc non Af Amer 73 (*) >90 mL/min   GFR calc Af Amer 85 (*) >90 mL/min   Recent Results (from the past 240 hour(s))  CULTURE, URINE COMPREHENSIVE     Status: None   Collection Time    05/30/13  4:57 PM      Result Value Range Status   Culture ESCHERICHIA COLI   Final   Colony Count >=100,000 COLONIES/ML   Final   Organism ID, Bacteria ESCHERICHIA COLI   Final   Creatinine:  Recent Labs  06/04/13 1951  CREATININE 0.87    Radiologic Imaging: Ct Abdomen Pelvis Wo Contrast  06/04/2013   CLINICAL DATA:  Right flank pain for 1 week.  EXAM: CT ABDOMEN AND  PELVIS WITHOUT CONTRAST  TECHNIQUE: Multidetector CT imaging of the abdomen and pelvis was performed following the standard protocol without intravenous contrast.  COMPARISON:  12/29/2010  FINDINGS: There is no pleural effusion identified. The lung bases appear clear. There is no focal liver abnormality identified. The gallbladder appears  collapsed around multiple small calcified stones. No pericholecystic fluid identified. No biliary dilatation. Normal appearance of the pancreas. The spleen is unremarkable.  The adrenal glands both appear normal. There is moderate right-sided hydronephrosis. Within the proximal right ureter there are 2 adjacent obstructing stones. Together these measure 9 mm, image 49/ series 3. Within the dependent portion of the dilated right renal pelvis there is a stone measuring 5 mm. There is moderate right-sided hydronephrosis. Multiple foci of gas are identified within the proximal right renal collecting system which may indicate the presence of an aerobic infection. Cyst within the midpole of left kidney is incompletely characterized without IV contrast. No left renal stones are evidence of left-sided obstructive uropathy. The urinary bladder is unremarkable. Uterus and the adnexal structures are unremarkable.  There is calcified atherosclerotic disease affecting the abdominal aorta. No aneurysm. There is no upper abdominal adenopathy. No pelvic or inguinal adenopathy identified. No free fluid or fluid collections identified.  Gastric band is in place. The bowel loops within the abdomen and pelvis appear normal.  Review of the visualized osseous structures is significant for lumbar spondylosis. No aggressive lytic or sclerotic bone lesions identified.  IMPRESSION: 1. High-grade right-sided obstructive uropathy. There are 2 obstructing stones identified within the proximal right ureter. 2. Multiple foci of gas are identified within the dilated proximal right renal collecting system. In the absence of recent instrumentation this is a finding worrisome for anaerobic infection. 3. Gallstones. 4. Atherosclerotic disease. 5. Status post gastric banding.   Electronically Signed   By: Kerby Moors M.D.   On: 06/04/2013 15:58   I reviewed the patient's CT images. This does show gas within the proximal right ureter. She has  normal renal function, and minimally infected appearing urine. Impression/Assessment:  Infected right renal collecting system with an obstructing right ureteral stone  Plan:  I discussed management with the patient and her cousins, who were in the room with her. Even though she looks good, and does not have significant leukocytosis, I think it necessary to place a stent in her right ureter emergently, with proper antibiotic management down the road. I would recommend we placed a stent tonight. I discussed the procedure as well as further hospitalization with the patient and her cousins. I think, at the least, we should leave her in tonight. She is afebrile in the morning, possible discharge with adequate antibiotic management and further lithotripsy down the road.  Jorja Loa 06/04/2013, 9:32 PM  Lillette Boxer. Cybil Senegal MD

## 2013-06-05 ENCOUNTER — Encounter (HOSPITAL_COMMUNITY): Payer: Self-pay | Admitting: Orthopedic Surgery

## 2013-06-05 ENCOUNTER — Inpatient Hospital Stay: Admission: RE | Admit: 2013-06-05 | Payer: PRIVATE HEALTH INSURANCE | Source: Ambulatory Visit

## 2013-06-05 LAB — URINE CULTURE
CULTURE: NO GROWTH
CULTURE: NO GROWTH
Colony Count: NO GROWTH
Colony Count: NO GROWTH

## 2013-06-05 LAB — GLUCOSE, CAPILLARY: Glucose-Capillary: 150 mg/dL — ABNORMAL HIGH (ref 70–99)

## 2013-06-05 MED ORDER — DOCUSATE SODIUM 100 MG PO CAPS
100.0000 mg | ORAL_CAPSULE | Freq: Two times a day (BID) | ORAL | Status: DC
  Filled 2013-06-05 (×2): qty 1

## 2013-06-05 MED ORDER — OXYBUTYNIN CHLORIDE 5 MG PO TABS: 5.0000 mg | ORAL_TABLET | Freq: Three times a day (TID) | ORAL | Status: DC

## 2013-06-05 MED ORDER — ZOLPIDEM TARTRATE 5 MG PO TABS: 5.0000 mg | ORAL_TABLET | Freq: Every evening | ORAL | Status: DC | PRN

## 2013-06-05 MED ORDER — LEVOFLOXACIN 500 MG PO TABS: 500.0000 mg | ORAL_TABLET | Freq: Every day | ORAL | Status: DC

## 2013-06-05 MED ORDER — OXYBUTYNIN CHLORIDE 5 MG PO TABS
5.0000 mg | ORAL_TABLET | Freq: Three times a day (TID) | ORAL | Status: DC | PRN
  Filled 2013-06-05: qty 1

## 2013-06-05 MED ORDER — ONDANSETRON HCL 4 MG/2ML IJ SOLN: 4.0000 mg | INTRAMUSCULAR | Status: DC | PRN

## 2013-06-05 MED ORDER — ACETAMINOPHEN 325 MG PO TABS: 650.0000 mg | ORAL_TABLET | ORAL | Status: DC | PRN

## 2013-06-05 MED ORDER — HYDROMORPHONE HCL PF 1 MG/ML IJ SOLN: 0.5000 mg | INTRAMUSCULAR | Status: DC | PRN

## 2013-06-05 MED ORDER — ENOXAPARIN SODIUM 40 MG/0.4ML ~~LOC~~ SOLN
40.0000 mg | SUBCUTANEOUS | Status: DC
  Filled 2013-06-05: qty 0.4

## 2013-06-05 MED ORDER — SODIUM CHLORIDE 0.45 % IV SOLN
INTRAVENOUS | Status: DC
  Administered 2013-06-05: 02:00:00 via INTRAVENOUS

## 2013-06-05 MED ORDER — LEVOFLOXACIN 500 MG PO TABS
500.0000 mg | ORAL_TABLET | Freq: Every day | ORAL | Status: DC
  Filled 2013-06-05: qty 1

## 2013-06-05 NOTE — Progress Notes (Signed)
Taken to [facility] where there is nutrition and both glucose tablets and glucose 15 in the pyxis. ( There is neither in the pyxis is pacu)

## 2013-06-05 NOTE — Discharge Summary (Signed)
Patient ID: Zehava Harrington MRN: [id number] DOB/AGE: [birth year] 58 y.o.  Admit date: 06/04/2013 Discharge date: 06/05/2013  Primary Care Physician:  Scarlette Calico, MD  Discharge Diagnoses:   Present on Admission:  . Calculus of ureter *Urinary tract infection *Right hydronephrosis  Consults:  None     Discharge Medications:   Medication List    STOP taking these medications       ciprofloxacin 500 MG tablet  Commonly known as:  CIPRO      TAKE these medications       atorvastatin 80 MG tablet  Commonly known as:  LIPITOR  Take 80 mg by mouth daily.     glyBURIDE 5 MG tablet  Commonly known as:  DIABETA  Take 5 mg by mouth daily.     INVOKANA 100 MG Tabs  Generic drug:  Canagliflozin  Take 100 mg by mouth daily.     levofloxacin 500 MG tablet  Commonly known as:  LEVAQUIN  Take 1 tablet (500 mg total) by mouth daily.     levofloxacin 500 MG tablet  Commonly known as:  LEVAQUIN  Take 1 tablet (500 mg total) by mouth daily.     losartan 100 MG tablet  Commonly known as:  COZAAR  Take 1 tablet (100 mg total) by mouth daily.     metFORMIN 1000 MG tablet  Commonly known as:  GLUCOPHAGE  Take 1,000 mg by mouth 2 (two) times daily.     oxybutynin 5 MG tablet  Commonly known as:  DITROPAN  Take 1 tablet (5 mg total) by mouth 3 (three) times daily.     oxybutynin 5 MG tablet  Commonly known as:  DITROPAN  Take 1 tablet (5 mg total) by mouth 3 (three) times daily.     pantoprazole 40 MG tablet  Commonly known as:  PROTONIX  Take 40 mg by mouth daily.         Significant Diagnostic Studies:  Ct Abdomen Pelvis Wo Contrast  06/04/2013   CLINICAL DATA:  Right flank pain for 1 week.  EXAM: CT ABDOMEN AND PELVIS WITHOUT CONTRAST  TECHNIQUE: Multidetector CT imaging of the abdomen and pelvis was performed following the standard protocol without intravenous contrast.  COMPARISON:  12/29/2010  FINDINGS: There is no pleural effusion identified. The lung bases appear clear.  There is no focal liver abnormality identified. The gallbladder appears collapsed around multiple small calcified stones. No pericholecystic fluid identified. No biliary dilatation. Normal appearance of the pancreas. The spleen is unremarkable.  The adrenal glands both appear normal. There is moderate right-sided hydronephrosis. Within the proximal right ureter there are 2 adjacent obstructing stones. Together these measure 9 mm, image 49/ series 3. Within the dependent portion of the dilated right renal pelvis there is a stone measuring 5 mm. There is moderate right-sided hydronephrosis. Multiple foci of gas are identified within the proximal right renal collecting system which may indicate the presence of an aerobic infection. Cyst within the midpole of left kidney is incompletely characterized without IV contrast. No left renal stones are evidence of left-sided obstructive uropathy. The urinary bladder is unremarkable. Uterus and the adnexal structures are unremarkable.  There is calcified atherosclerotic disease affecting the abdominal aorta. No aneurysm. There is no upper abdominal adenopathy. No pelvic or inguinal adenopathy identified. No free fluid or fluid collections identified.  Gastric band is in place. The bowel loops within the abdomen and pelvis appear normal.  Review of the visualized osseous structures is significant for lumbar spondylosis.  No aggressive lytic or sclerotic bone lesions identified.  IMPRESSION: 1. High-grade right-sided obstructive uropathy. There are 2 obstructing stones identified within the proximal right ureter. 2. Multiple foci of gas are identified within the dilated proximal right renal collecting system. In the absence of recent instrumentation this is a finding worrisome for anaerobic infection. 3. Gallstones. 4. Atherosclerotic disease. 5. Status post gastric banding.   Electronically Signed   By: Kerby Moors M.D.   On: 06/04/2013 15:58   Dg Chest 2 View  06/04/2013    CLINICAL DATA:  Preoperative right renal stent placement; hypertension  EXAM: CHEST  2 VIEW  COMPARISON:  May 07, 2013  FINDINGS: Lungs are clear. Heart size and pulmonary vascularity are normal. No adenopathy. No bone lesions. A lap band is positioned in the region of the gastric cardia. There is atherosclerotic change in the aortic arch region. There is calcification in the left carotid artery.  IMPRESSION: No edema or consolidation.   Electronically Signed   By: Lowella Grip M.D.   On: 06/04/2013 22:00    Brief H and P: For complete details please refer to admission H and P, but in brief the patient was admitted because an obstructing right ureteral stone with obvious gas-forming organisms proximal to this. Despite her stable status, it was recommended she undergo urgent stenting and antibiotic management.  Hospital Course:  Active Problems:   Calculus of ureter  The patient was admitted to the operating room, and underwent an uncomplicated right double-J stent placement. Urine from the right renal pelvis was sent for culture. She was stable postoperatively, and by postoperative day #1 was afebrile and normotensive with a normal heart rate. She was felt adequate for discharge at this time. Day of Discharge BP 95/58  Pulse 51  Temp(Src) 97.6 F (36.4 C) (Oral)  Resp 16  Ht 5\' 7"  (1.702 m)  Wt 72.576 kg (160 lb)  BMI 25.05 kg/m2  SpO2 97%  Results for orders placed during the hospital encounter of 06/04/13 (from the past 24 hour(s))  URINALYSIS, ROUTINE W REFLEX MICROSCOPIC     Status: Abnormal   Collection Time    06/04/13  7:49 PM      Result Value Range   Color, Urine YELLOW  YELLOW   APPearance CLEAR  CLEAR   Specific Gravity, Urine 1.025  1.005 - 1.030   pH 6.5  5.0 - 8.0   Glucose, UA >1000 (*) NEGATIVE mg/dL   Hgb urine dipstick TRACE (*) NEGATIVE   Bilirubin Urine NEGATIVE  NEGATIVE   Ketones, ur NEGATIVE  NEGATIVE mg/dL   Protein, ur NEGATIVE  NEGATIVE mg/dL    Urobilinogen, UA 0.2  0.0 - 1.0 mg/dL   Nitrite NEGATIVE  NEGATIVE   Leukocytes, UA SMALL (*) NEGATIVE  URINE MICROSCOPIC-ADD ON     Status: None   Collection Time    06/04/13  7:49 PM      Result Value Range   Squamous Epithelial / LPF RARE  RARE   WBC, UA 7-10  <3 WBC/hpf   RBC / HPF 11-20  <3 RBC/hpf   Bacteria, UA RARE  RARE  CBC WITH DIFFERENTIAL     Status: None   Collection Time    06/04/13  7:51 PM      Result Value Range   WBC 10.0  4.0 - 10.5 K/uL   RBC 4.56  3.87 - 5.11 MIL/uL   Hemoglobin 12.1  12.0 - 15.0 g/dL   HCT 37.6  36.0 -  46.0 %   MCV 82.5  78.0 - 100.0 fL   MCH 26.5  26.0 - 34.0 pg   MCHC 32.2  30.0 - 36.0 g/dL   RDW 14.2  11.5 - 15.5 %   Platelets 286  150 - 400 K/uL   Neutrophils Relative % 55  43 - 77 %   Neutro Abs 5.5  1.7 - 7.7 K/uL   Lymphocytes Relative 39  12 - 46 %   Lymphs Abs 3.8  0.7 - 4.0 K/uL   Monocytes Relative 5  3 - 12 %   Monocytes Absolute 0.5  0.1 - 1.0 K/uL   Eosinophils Relative 1  0 - 5 %   Eosinophils Absolute 0.1  0.0 - 0.7 K/uL   Basophils Relative 0  0 - 1 %   Basophils Absolute 0.0  0.0 - 0.1 K/uL  BASIC METABOLIC PANEL     Status: Abnormal   Collection Time    06/04/13  7:51 PM      Result Value Range   Sodium 138  137 - 147 mEq/L   Potassium 4.5  3.7 - 5.3 mEq/L   Chloride 96  96 - 112 mEq/L   CO2 31  19 - 32 mEq/L   Glucose, Bld 135 (*) 70 - 99 mg/dL   BUN 20  6 - 23 mg/dL   Creatinine, Ser 0.87  0.50 - 1.10 mg/dL   Calcium 10.8 (*) 8.4 - 10.5 mg/dL   GFR calc non Af Amer 73 (*) >90 mL/min   GFR calc Af Amer 85 (*) >90 mL/min  GLUCOSE, CAPILLARY     Status: Abnormal   Collection Time    06/04/13 11:23 PM      Result Value Range   Glucose-Capillary 59 (*) 70 - 99 mg/dL  GLUCOSE, CAPILLARY     Status: Abnormal   Collection Time    06/04/13 11:56 PM      Result Value Range   Glucose-Capillary 58 (*) 70 - 99 mg/dL  GLUCOSE, CAPILLARY     Status: Abnormal   Collection Time    06/05/13 12:52 AM      Result  Value Range   Glucose-Capillary 150 (*) 70 - 99 mg/dL    Physical Exam: General: Alert and awake oriented x3 not in any acute distress. HEENT: anicteric sclera, pupils reactive to light and accommodation CVS: S1-S2 clear no murmur rubs or gallops Chest: clear to auscultation bilaterally, no wheezing rales or rhonchi Abdomen: soft nontender, nondistended, normal bowel sounds, no organomegaly Extremities: no cyanosis, clubbing or edema noted bilaterally Neuro: Cranial nerves II-XII intact, no focal neurological deficits  Disposition:  Home  Diet:  Return to normal diet  Activity:  Gradually increase   Disposition and Follow-up:      Future Appointments Provider Department Dept Phone   06/19/2013 1:50 PM [facility]  [facility]AG[facility] [phone]   Please wear two piece clothing and wear no powder or deodorant. Please arrive 15 minutes early prior to your appointment time.   06/27/2013 3:30 PM [place] [phone]   07/11/2013 11:30 AM Gatha Mayer, MD [place] [phone]   07/14/2013 10:00 AM [facility] [place] [facility] [address] [phone]   07/14/2013 10:40 AM Sharmon Leyden Nickel, NP [facility] [place] [phone]         TESTS THAT NEED FOLLOW-UP  Followup urine culture  DISCHARGE FOLLOW-UP   Time spent  on Discharge:  15 minutes  Signed: Jorja Loa 06/05/2013, 8:44 AM

## 2013-06-05 NOTE — Care Management Note (Signed)
    Page 1 of 1   06/05/2013     10:37:17 AM   CARE MANAGEMENT NOTE 06/05/2013  Patient:  Theresa Harrington, Theresa Harrington   Account Number:  [id number]  Date Initiated:  06/05/2013  Documentation initiated by:  Dessa Phi  Subjective/Objective Assessment:   58 Y/O F ADMITTED W/CALCULUS OF URETER.     Action/Plan:   FROM HOME.   Anticipated DC Date:  06/05/2013   Anticipated DC Plan:  [place]  CM consult      Choice offered to / List presented to:             Status of service:  Completed, signed off Medicare Important Message given?   (If response is "NO", the following Medicare IM given date fields will be blank) Date Medicare IM given:   Date Additional Medicare IM given:    Discharge Disposition:  HOME/SELF CARE  Per UR Regulation:  Reviewed for med. necessity/level of care/duration of stay  If discussed at [place] of Stay Meetings, dates discussed:    Comments:

## 2013-06-18 ENCOUNTER — Other Ambulatory Visit: Payer: Self-pay | Admitting: Urology

## 2013-06-19 ENCOUNTER — Encounter (HOSPITAL_COMMUNITY): Payer: Self-pay | Admitting: *Deleted

## 2013-06-19 ENCOUNTER — Ambulatory Visit
Admission: RE | Admit: 2013-06-19 | Discharge: 2013-06-19 | Disposition: A | Discharge status: Home / Self Care | Payer: PRIVATE HEALTH INSURANCE | Source: Ambulatory Visit

## 2013-06-19 ENCOUNTER — Encounter (HOSPITAL_COMMUNITY): Payer: Self-pay | Admitting: Pharmacy Technician

## 2013-06-19 DIAGNOSIS — Z1231 Encounter for screening mammogram for malignant neoplasm of breast: Secondary | ICD-10-CM

## 2013-06-19 LAB — HM MAMMOGRAPHY: HM Mammogram: NORMAL

## 2013-06-19 NOTE — Progress Notes (Signed)
Pre op phone call completed, instructions given  (see under pt education)  Verbalizes understanding of all instructions. Informed that pharmacy will be in contact with her to do med rec.  Denies history of cardiac disease

## 2013-06-23 ENCOUNTER — Encounter (HOSPITAL_COMMUNITY)
Admission: RE | Disposition: A | Discharge status: Home / Self Care | Payer: Self-pay | Source: Ambulatory Visit | Attending: Urology

## 2013-06-23 ENCOUNTER — Encounter (HOSPITAL_COMMUNITY): Payer: Self-pay | Admitting: *Deleted

## 2013-06-23 ENCOUNTER — Ambulatory Visit (HOSPITAL_COMMUNITY): Payer: BC Managed Care – PPO

## 2013-06-23 ENCOUNTER — Ambulatory Visit (HOSPITAL_COMMUNITY)
Admission: RE | Admit: 2013-06-23 | Discharge: 2013-06-23 | Disposition: A | Discharge status: Home / Self Care | Payer: BC Managed Care – PPO | Source: Ambulatory Visit | Attending: Urology | Admitting: Urology

## 2013-06-23 DIAGNOSIS — E119 Type 2 diabetes mellitus without complications: Secondary | ICD-10-CM | POA: Insufficient documentation

## 2013-06-23 DIAGNOSIS — E7800 Pure hypercholesterolemia, unspecified: Secondary | ICD-10-CM | POA: Insufficient documentation

## 2013-06-23 DIAGNOSIS — N1330 Unspecified hydronephrosis: Secondary | ICD-10-CM | POA: Insufficient documentation

## 2013-06-23 DIAGNOSIS — Z79899 Other long term (current) drug therapy: Secondary | ICD-10-CM | POA: Insufficient documentation

## 2013-06-23 DIAGNOSIS — I10 Essential (primary) hypertension: Secondary | ICD-10-CM | POA: Insufficient documentation

## 2013-06-23 DIAGNOSIS — N201 Calculus of ureter: Secondary | ICD-10-CM | POA: Insufficient documentation

## 2013-06-23 DIAGNOSIS — N390 Urinary tract infection, site not specified: Secondary | ICD-10-CM | POA: Insufficient documentation

## 2013-06-23 DIAGNOSIS — Z87891 Personal history of nicotine dependence: Secondary | ICD-10-CM | POA: Insufficient documentation

## 2013-06-23 HISTORY — DX: Other specified postprocedural states: Z98.890

## 2013-06-23 HISTORY — DX: Nausea with vomiting, unspecified: R11.2

## 2013-06-23 HISTORY — DX: Other specified postprocedural states: R11.2

## 2013-06-23 LAB — GLUCOSE, CAPILLARY: GLUCOSE-CAPILLARY: 137 mg/dL — AB (ref 70–99)

## 2013-06-23 SURGERY — LITHOTRIPSY, ESWL
Anesthesia: LOCAL | Laterality: Right

## 2013-06-23 MED ORDER — DIPHENHYDRAMINE HCL 25 MG PO CAPS
25.0000 mg | ORAL_CAPSULE | ORAL | Status: AC
  Administered 2013-06-23: 25 mg via ORAL
  Filled 2013-06-23: qty 1

## 2013-06-23 MED ORDER — CIPROFLOXACIN HCL 500 MG PO TABS
500.0000 mg | ORAL_TABLET | ORAL | Status: AC
  Administered 2013-06-23: 500 mg via ORAL
  Filled 2013-06-23: qty 1

## 2013-06-23 MED ORDER — DEXTROSE-NACL 5-0.45 % IV SOLN
INTRAVENOUS | Status: DC
  Administered 2013-06-23: 11:00:00 via INTRAVENOUS

## 2013-06-23 MED ORDER — DIAZEPAM 5 MG PO TABS
10.0000 mg | ORAL_TABLET | ORAL | Status: AC
  Administered 2013-06-23: 10 mg via ORAL
  Filled 2013-06-23: qty 2

## 2013-06-23 NOTE — Discharge Instructions (Signed)
Please refer to [facility] discharge instructions.  You have one new prescription for hydrocodone 5/325mg : take one to two tabs every 4 hours as needed for pain.  No change to your home medications.

## 2013-06-23 NOTE — H&P (Signed)
History of Present Illness Theresa Harrington had a right JJ stent inserted by Dr Diona Fanti on 2/4 for right pyelonephritis and a 9 mm proximal ureteral calculus found on CT scan. She did not have any pain or fever. CT scan also revealed cholelithiasis. She does not have any right upper quadrant pain. She had ESL of a right ureteral stone  KUB in October 2012 revealed a large stone fragment in the proximal ureter. At her next visit she reported that she passed it. KUB in 2014 showed a large stone fragment in the same location. She has not had any pain. The stone is probably the same stone that was treated in 2012.  Urine culture on 1/30 was positive for E. coli. Repeat culture on 2/4 was negative. Urinalysis today shows TNTC RBC's, 3-6 WBC's, >1000 mgm glucose, nitrite positive.   Past Medical History Problems  1. History of diabetes mellitus (V12.29) 2. History of hypercholesterolemia (V12.29) 3. History of hypertension (V12.59) 4. History of Microscopic hematuria (599.72)  Surgical History Problems  1. History of Abdominoplasty 2. History of Breast Surgery 3. History of Cystoscopy With Insertion Of Ureteral Stent Right 4. History of Laparosc Restrictive Proc Adjustable Gastric Band Placement 5. History of Lithotripsy  Current Meds 1. Atorvastatin Calcium TABS;  Therapy: (Recorded:16Feb2015) to Recorded 2. GlyBURIDE 5 MG Oral Tablet;  Therapy: (Recorded:20Aug2012) to Recorded 3. Invokana 100 MG Oral Tablet;  Therapy: (Recorded:16Feb2015) to Recorded 4. Levaquin 500 MG Oral Tablet;  Therapy: (Recorded:16Feb2015) to Recorded 5. Losartan Potassium TABS;  Therapy: (Recorded:16Feb2015) to Recorded 6. MetFORMIN HCl - 1000 MG Oral Tablet;  Therapy: (Recorded:20Aug2012) to Recorded 7. Oxybutynin Chloride 5 MG Oral Tablet;  Therapy: (Recorded:16Feb2015) to Recorded 8. Pantoprazole Sodium 40 MG Intravenous Solution Reconstituted;  Therapy: (Recorded:16Feb2015) to Recorded  Allergies Medication   1. Codeine Derivatives  Family History Problems  1. Family history of Father Deceased At Age ____   35 2. Family history of Mother Deceased At Age 71 3. Family history of Stroke Syndrome (V17.1) : Brother  Social History Problems  1. Denied: History of Alcohol Use 2. Caffeine Use   1 large cup coffee 3. Former smoker (V15.82) 4. Marital History - Single 5. Tobacco use (305.1)   smoked 1 pk for 20 yrs / quit 10 yrs ago  Review of Systems Genitourinary, constitutional, skin, eye, otolaryngeal, hematologic/lymphatic, cardiovascular, pulmonary, endocrine, musculoskeletal, gastrointestinal, neurological and psychiatric system(s) were reviewed and pertinent findings if present are noted.    Vitals Vital Signs [Data Includes: Last 1 Day]  Recorded: [id number] 02:38PM  Height: 5 ft 5 in Weight: 168 lb  BMI Calculated: 27.96 BSA Calculated: 1.84 Blood Pressure: 100 / 65 Temperature: 98.2 F Heart Rate: 68  Physical Exam Constitutional: Well nourished and well developed . No acute distress.  ENT:. The ears and nose are normal in appearance.  Neck: The appearance of the neck is normal and no neck mass is present.  Pulmonary: No respiratory distress and normal respiratory rhythm and effort.  Cardiovascular: Heart rate and rhythm are normal . No peripheral edema.  Abdomen: The abdomen is soft and nontender. No masses are palpated. No CVA tenderness. No hernias are palpable. No hepatosplenomegaly noted.  Genitourinary:  The bladder is non tender and not distended.  Lymphatics: The femoral and inguinal nodes are not enlarged or tender.  Skin: Normal skin turgor, no visible rash and no visible skin lesions.  Neuro/Psych:. Mood and affect are appropriate.    Results/Data Urine [Data Includes: Last 1 Day]   [id number]  COLOR RED   APPEARANCE CLOUDY   SPECIFIC GRAVITY >1.030   pH 5.5   GLUCOSE > 1000 mg/dL  BILIRUBIN SMALL   KETONE TRACE mg/dL  BLOOD LARGE   PROTEIN > 300  mg/dL  UROBILINOGEN 1 mg/dL  NITRITE POS   LEUKOCYTE ESTERASE TRACE   SQUAMOUS EPITHELIAL/HPF RARE   WBC 3-6 WBC/hpf  RBC TNTC RBC/hpf  BACTERIA RARE   CRYSTALS NONE SEEN   CASTS NONE SEEN    I independently reviewed the CT scan and the findings are as noted above.   Assessment Assessed  1. Calculus of right ureter (592.1) 2. Hydronephrosis (591) 3. Urinary tract infection (599.0)  Plan Health Maintenance  1. UA With REFLEX; [Do Not Release]; Status:Resulted - Requires Verification;   Done:  [id number] 02:24PM Urinary tract infection  2. URINE CULTURE; Status:In Progress - Specimen/Data Collected;   Done: [id number]  Urine culture. Treat if culture is positive. She needs ESL of the right ureteral stone. The procedure, risks, benefits were discussed with the patient. The risks include but are not limited to hemorrhage, infection, renal or perirenal hematoma, injury to adjacent organs, inability to fragment the stone, steinstrasse. She understands and is agreeable.   Signatures Electronically signed by : Lowella Bandy, M.D.; Jun 16 2013  4:15PM EST

## 2013-06-23 NOTE — Op Note (Signed)
Refer to [facility] Op Note scanned in the chart 

## 2013-07-09 ENCOUNTER — Encounter: Payer: Self-pay | Admitting: Internal Medicine

## 2013-07-09 ENCOUNTER — Ambulatory Visit (INDEPENDENT_AMBULATORY_CARE_PROVIDER_SITE_OTHER): Payer: BC Managed Care – PPO | Admitting: Internal Medicine

## 2013-07-09 ENCOUNTER — Other Ambulatory Visit (INDEPENDENT_AMBULATORY_CARE_PROVIDER_SITE_OTHER): Payer: BC Managed Care – PPO

## 2013-07-09 VITALS — BP 192/84 | HR 59 | Temp 97.4°F | Resp 16 | Ht 67.0 in | Wt 169.8 lb

## 2013-07-09 DIAGNOSIS — E1159 Type 2 diabetes mellitus with other circulatory complications: Secondary | ICD-10-CM

## 2013-07-09 DIAGNOSIS — N390 Urinary tract infection, site not specified: Secondary | ICD-10-CM

## 2013-07-09 DIAGNOSIS — I10 Essential (primary) hypertension: Secondary | ICD-10-CM

## 2013-07-09 DIAGNOSIS — E0590 Thyrotoxicosis, unspecified without thyrotoxic crisis or storm: Secondary | ICD-10-CM

## 2013-07-09 LAB — BASIC METABOLIC PANEL
BUN: 12 mg/dL (ref 6–23)
CALCIUM: 9.8 mg/dL (ref 8.4–10.5)
CO2: 30 mEq/L (ref 19–32)
Chloride: 104 mEq/L (ref 96–112)
Creatinine, Ser: 0.6 mg/dL (ref 0.4–1.2)
GFR: 101.88 mL/min (ref >60.00)
Glucose, Bld: 87 mg/dL (ref 70–99)
POTASSIUM: 4 meq/L (ref 3.5–5.1)
SODIUM: 139 meq/L (ref 135–145)

## 2013-07-09 LAB — URINALYSIS, ROUTINE W REFLEX MICROSCOPIC
BILIRUBIN URINE: NEGATIVE
Ketones, ur: NEGATIVE
NITRITE: NEGATIVE
PH: 6.5 (ref 5.0–8.0)
Specific Gravity, Urine: 1.01 (ref 1.000–1.030)
Total Protein, Urine: NEGATIVE
Urine Glucose: >=1000 — AB
Urobilinogen, UA: 0.2 (ref 0.0–1.0)

## 2013-07-09 LAB — HEMOGLOBIN A1C: Hgb A1c MFr Bld: 8.1 % — ABNORMAL HIGH (ref 4.6–6.5)

## 2013-07-09 LAB — TSH: TSH: 0.58 u[IU]/mL (ref 0.35–5.50)

## 2013-07-09 MED ORDER — OLMESARTAN-AMLODIPINE-HCTZ 40-5-12.5 MG PO TABS: 1.0000 | ORAL_TABLET | Freq: Every day | ORAL | Status: DC

## 2013-07-09 NOTE — Assessment & Plan Note (Signed)
A1C today shows some improvement in the blood sugar

## 2013-07-09 NOTE — Progress Notes (Signed)
   Subjective:    Patient ID: Theresa Harrington, female    DOB: Sep 13, 1955, 58 y.o.   MRN: [id number]  Hypertension This is a chronic problem. The current episode started more than 1 year ago. The problem has been gradually worsening since onset. The problem is uncontrolled. Pertinent negatives include no anxiety, blurred vision, chest pain, headaches, malaise/fatigue, neck pain, orthopnea, palpitations, peripheral edema, PND, shortness of breath or sweats. There are no associated agents to hypertension. The current treatment provides moderate improvement. There are no compliance problems.       Review of Systems  Constitutional: Negative.  Negative for fever, chills, malaise/fatigue, diaphoresis, appetite change and fatigue.  HENT: Negative.   Eyes: Negative.  Negative for blurred vision.  Respiratory: Negative.  Negative for cough, choking, chest tightness, shortness of breath and stridor.   Cardiovascular: Negative.  Negative for chest pain, palpitations, orthopnea, leg swelling and PND.  Gastrointestinal: Negative.  Negative for nausea, vomiting, abdominal pain, diarrhea, constipation and blood in stool.  Endocrine: Negative.  Negative for polydipsia, polyphagia and polyuria.  Genitourinary: Negative.  Negative for dysuria, urgency, frequency, hematuria, flank pain, decreased urine volume, enuresis and difficulty urinating.  Musculoskeletal: Negative.  Negative for neck pain.  Skin: Negative.  Negative for rash.  Allergic/Immunologic: Negative.   Neurological: Negative for dizziness and headaches.  Hematological: Negative.  Negative for adenopathy. Does not bruise/bleed easily.  Psychiatric/Behavioral: Negative.        Objective:   Physical Exam  Vitals reviewed. Constitutional: She is oriented to person, place, and time. She appears well-developed and well-nourished. No distress.  HENT:  Head: Normocephalic and atraumatic.  Mouth/Throat: Oropharynx is clear and moist. No oropharyngeal  exudate.  Eyes: Conjunctivae are normal. Right eye exhibits no discharge. Left eye exhibits no discharge. No scleral icterus.  Neck: Normal range of motion. Neck supple. No JVD present. No tracheal deviation present. No thyromegaly present.  Cardiovascular: Normal rate, regular rhythm, normal heart sounds and intact distal pulses.  Exam reveals no gallop and no friction rub.   No murmur heard. Pulmonary/Chest: Effort normal and breath sounds normal. No stridor. No respiratory distress. She has no wheezes. She has no rales. She exhibits no tenderness.  Abdominal: Soft. Bowel sounds are normal. She exhibits no distension and no mass. There is no tenderness. There is no rebound and no guarding.  Musculoskeletal: Normal range of motion. She exhibits no edema and no tenderness.  Lymphadenopathy:    She has no cervical adenopathy.  Neurological: She is oriented to person, place, and time.  Skin: Skin is warm and dry. No rash noted. She is not diaphoretic. No erythema. No pallor.  Psychiatric: She has a normal mood and affect. Her behavior is normal. Judgment and thought content normal.     Lab Results  Component Value Date   WBC 10.0 06/04/2013   HGB 12.1 06/04/2013   HCT 37.6 06/04/2013   PLT 286 06/04/2013   GLUCOSE 135* 06/04/2013   CHOL 137 08/06/2012   TRIG 103.0 08/06/2012   HDL 53.60 08/06/2012   LDLDIRECT 160.2 03/01/2010   LDLCALC 63 08/06/2012   ALT 17 10/07/2012   AST 18 10/07/2012   NA 138 06/04/2013   K 4.5 06/04/2013   CL 96 06/04/2013   CREATININE 0.87 06/04/2013   BUN 20 06/04/2013   CO2 31 06/04/2013   TSH 0.42 02/28/2013   HGBA1C 8.5* 02/28/2013   MICROALBUR 3.5* 03/01/2010       Assessment & Plan:

## 2013-07-09 NOTE — Progress Notes (Signed)
Pre visit review using our clinic review tool, if applicable. No additional management support is needed unless otherwise documented below in the visit note. 

## 2013-07-09 NOTE — Assessment & Plan Note (Signed)
Her BP is not well controlled I will check her labs today to look for secondary causes of this and to screen for end organ damage I have asked her tos top taking the cozaar and to start taking tribenzor for BP control

## 2013-07-09 NOTE — Patient Instructions (Signed)

## 2013-07-09 NOTE — Assessment & Plan Note (Signed)
UA today looks okay She sees urology soon about this

## 2013-07-11 ENCOUNTER — Encounter: Payer: PRIVATE HEALTH INSURANCE | Admitting: Internal Medicine

## 2013-07-14 ENCOUNTER — Encounter (HOSPITAL_COMMUNITY): Payer: PRIVATE HEALTH INSURANCE

## 2013-07-14 ENCOUNTER — Ambulatory Visit: Payer: PRIVATE HEALTH INSURANCE | Admitting: Family

## 2013-07-14 ENCOUNTER — Ambulatory Visit: Payer: PRIVATE HEALTH INSURANCE | Admitting: Surgery

## 2013-07-16 ENCOUNTER — Encounter: Payer: Self-pay | Admitting: Internal Medicine

## 2013-07-16 ENCOUNTER — Ambulatory Visit (INDEPENDENT_AMBULATORY_CARE_PROVIDER_SITE_OTHER): Payer: BC Managed Care – PPO | Admitting: Internal Medicine

## 2013-07-16 VITALS — BP 118/70 | HR 72 | Temp 97.5°F | Resp 16 | Ht 65.0 in | Wt 167.0 lb

## 2013-07-16 DIAGNOSIS — E1159 Type 2 diabetes mellitus with other circulatory complications: Secondary | ICD-10-CM

## 2013-07-16 DIAGNOSIS — I10 Essential (primary) hypertension: Secondary | ICD-10-CM

## 2013-07-16 NOTE — Progress Notes (Signed)
   Subjective:    Patient ID: Theresa Harrington, female    DOB: [birth year], 57 y.o.   MRN: [id number]  Hypertension This is a chronic problem. The current episode started more than 1 year ago. The problem has been rapidly improving since onset. The problem is controlled. Pertinent negatives include no anxiety, blurred vision, chest pain, headaches, malaise/fatigue, neck pain, orthopnea, palpitations, peripheral edema, PND, shortness of breath or sweats. Past treatments include angiotensin blockers, calcium channel blockers and diuretics. There are no compliance problems.       Review of Systems  Constitutional: Negative.  Negative for malaise/fatigue and fatigue.  HENT: Negative.   Eyes: Negative.  Negative for blurred vision.  Respiratory: Negative.  Negative for cough, choking, shortness of breath and stridor.   Cardiovascular: Negative.  Negative for chest pain, palpitations, orthopnea, leg swelling and PND.  Gastrointestinal: Negative.  Negative for nausea, vomiting, abdominal pain, diarrhea, constipation and blood in stool.  Endocrine: Negative.   Genitourinary: Negative for dysuria, frequency and difficulty urinating.  Musculoskeletal: Negative.  Negative for neck pain.  Skin: Negative.   Allergic/Immunologic: Negative.   Neurological: Negative.  Negative for dizziness, tremors, weakness, light-headedness, numbness and headaches.  Hematological: Negative.  Negative for adenopathy. Does not bruise/bleed easily.  Psychiatric/Behavioral: Negative.        Objective:   Physical Exam  Vitals reviewed. Constitutional: She is oriented to person, place, and time. She appears well-developed and well-nourished. No distress.  HENT:  Head: Normocephalic and atraumatic.  Mouth/Throat: Oropharynx is clear and moist. No oropharyngeal exudate.  Eyes: Conjunctivae are normal. Right eye exhibits no discharge. Left eye exhibits no discharge. No scleral icterus.  Neck: Normal range of motion. Neck  supple. No JVD present. No tracheal deviation present. No thyromegaly present.  Cardiovascular: Normal rate, regular rhythm, normal heart sounds and intact distal pulses.  Exam reveals no gallop and no friction rub.   No murmur heard. Pulmonary/Chest: Effort normal and breath sounds normal. No stridor. No respiratory distress. She has no wheezes. She has no rales. She exhibits no tenderness.  Abdominal: Soft. Bowel sounds are normal. She exhibits no distension and no mass. There is no tenderness. There is no rebound and no guarding.  Musculoskeletal: Normal range of motion. She exhibits no edema and no tenderness.  Lymphadenopathy:    She has no cervical adenopathy.  Neurological: She is oriented to person, place, and time.  Skin: Skin is warm and dry. No rash noted. She is not diaphoretic. No erythema. No pallor.  Psychiatric: She has a normal mood and affect. Her behavior is normal. Judgment and thought content normal.     Lab Results  Component Value Date   WBC 10.0 06/04/2013   HGB 12.1 06/04/2013   HCT 37.6 06/04/2013   PLT 286 06/04/2013   GLUCOSE 87 07/09/2013   CHOL 137 08/06/2012   TRIG 103.0 08/06/2012   HDL 53.60 08/06/2012   LDLDIRECT 160.2 03/01/2010   LDLCALC 63 08/06/2012   ALT 17 10/07/2012   AST 18 10/07/2012   NA 139 07/09/2013   K 4.0 07/09/2013   CL 104 07/09/2013   CREATININE 0.6 07/09/2013   BUN 12 07/09/2013   CO2 30 07/09/2013   TSH 0.58 07/09/2013   HGBA1C 8.1* 07/09/2013   MICROALBUR 3.5* 03/01/2010       Assessment & Plan:

## 2013-07-16 NOTE — Progress Notes (Signed)
Pre visit review using our clinic review tool, if applicable. No additional management support is needed unless otherwise documented below in the visit note. 

## 2013-07-17 ENCOUNTER — Encounter: Payer: Self-pay | Admitting: Internal Medicine

## 2013-07-17 NOTE — Assessment & Plan Note (Signed)
Her BP is well controlled She will stay on the current regimen

## 2013-07-17 NOTE — Assessment & Plan Note (Addendum)
Her A1C is high, I think she needs to start insulin but she is not ready to do that at this time Will recheck the A1C in 3 months and will advise further

## 2013-07-19 ENCOUNTER — Other Ambulatory Visit: Payer: Self-pay | Admitting: Internal Medicine

## 2013-07-22 ENCOUNTER — Other Ambulatory Visit: Payer: Self-pay | Admitting: *Deleted

## 2013-07-22 DIAGNOSIS — I10 Essential (primary) hypertension: Secondary | ICD-10-CM

## 2013-07-22 MED ORDER — OLMESARTAN-AMLODIPINE-HCTZ 40-5-12.5 MG PO TABS: 1.0000 | ORAL_TABLET | Freq: Every day | ORAL | Status: DC

## 2013-07-22 MED ORDER — GLYBURIDE 5 MG PO TABS: 5.0000 mg | ORAL_TABLET | Freq: Every day | ORAL | Status: DC

## 2013-07-24 ENCOUNTER — Other Ambulatory Visit: Payer: Self-pay | Admitting: Urology

## 2013-07-25 ENCOUNTER — Ambulatory Visit: Payer: PRIVATE HEALTH INSURANCE | Admitting: Family

## 2013-07-25 ENCOUNTER — Encounter (HOSPITAL_COMMUNITY): Payer: PRIVATE HEALTH INSURANCE

## 2013-07-25 ENCOUNTER — Encounter (HOSPITAL_COMMUNITY): Payer: Self-pay | Admitting: Pharmacy Technician

## 2013-07-29 ENCOUNTER — Encounter (HOSPITAL_COMMUNITY): Payer: Self-pay | Admitting: *Deleted

## 2013-07-29 NOTE — Progress Notes (Signed)
Patient instructed to hold diabetes med am of procedure check blood sugar and may have clear liquids till 5:30 am then NPO. HS snack if she usually has one.  To read every page in blue folder Restricted med list per [facility] fill out Hx. Form prior to seeing [place].

## 2013-07-29 NOTE — Progress Notes (Signed)
Patient denies Hx. Of chest/angina pain, MI or Murmur.

## 2013-07-30 HISTORY — PX: KIDNEY STONE SURGERY: SHX686

## 2013-08-04 ENCOUNTER — Ambulatory Visit (HOSPITAL_COMMUNITY)
Admission: RE | Admit: 2013-08-04 | Discharge: 2013-08-04 | Disposition: A | Discharge status: Home / Self Care | Payer: BC Managed Care – PPO | Source: Ambulatory Visit | Attending: Urology | Admitting: Urology

## 2013-08-04 ENCOUNTER — Encounter (HOSPITAL_COMMUNITY): Payer: Self-pay | Admitting: General Practice

## 2013-08-04 ENCOUNTER — Ambulatory Visit (HOSPITAL_COMMUNITY): Payer: BC Managed Care – PPO

## 2013-08-04 ENCOUNTER — Encounter (HOSPITAL_COMMUNITY)
Admission: RE | Disposition: A | Discharge status: Home / Self Care | Payer: Self-pay | Source: Ambulatory Visit | Attending: Urology

## 2013-08-04 DIAGNOSIS — N1330 Unspecified hydronephrosis: Secondary | ICD-10-CM | POA: Insufficient documentation

## 2013-08-04 DIAGNOSIS — N201 Calculus of ureter: Secondary | ICD-10-CM | POA: Insufficient documentation

## 2013-08-04 DIAGNOSIS — I10 Essential (primary) hypertension: Secondary | ICD-10-CM | POA: Insufficient documentation

## 2013-08-04 DIAGNOSIS — Z79899 Other long term (current) drug therapy: Secondary | ICD-10-CM | POA: Insufficient documentation

## 2013-08-04 DIAGNOSIS — Z87891 Personal history of nicotine dependence: Secondary | ICD-10-CM | POA: Insufficient documentation

## 2013-08-04 DIAGNOSIS — E119 Type 2 diabetes mellitus without complications: Secondary | ICD-10-CM | POA: Insufficient documentation

## 2013-08-04 DIAGNOSIS — R3129 Other microscopic hematuria: Secondary | ICD-10-CM | POA: Insufficient documentation

## 2013-08-04 DIAGNOSIS — E7800 Pure hypercholesterolemia, unspecified: Secondary | ICD-10-CM | POA: Insufficient documentation

## 2013-08-04 LAB — GLUCOSE, CAPILLARY: Glucose-Capillary: 156 mg/dL — ABNORMAL HIGH (ref 70–99)

## 2013-08-04 SURGERY — LITHOTRIPSY, ESWL
Anesthesia: LOCAL | Laterality: Right

## 2013-08-04 MED ORDER — CIPROFLOXACIN HCL 500 MG PO TABS
500.0000 mg | ORAL_TABLET | ORAL | Status: AC
  Administered 2013-08-04: 500 mg via ORAL
  Filled 2013-08-04: qty 1

## 2013-08-04 MED ORDER — DIAZEPAM 5 MG PO TABS
10.0000 mg | ORAL_TABLET | ORAL | Status: AC
  Administered 2013-08-04: 10 mg via ORAL
  Filled 2013-08-04: qty 2

## 2013-08-04 MED ORDER — DIPHENHYDRAMINE HCL 25 MG PO CAPS
25.0000 mg | ORAL_CAPSULE | ORAL | Status: AC
  Administered 2013-08-04: 25 mg via ORAL
  Filled 2013-08-04: qty 1

## 2013-08-04 MED ORDER — DEXTROSE-NACL 5-0.45 % IV SOLN
INTRAVENOUS | Status: DC
  Administered 2013-08-04: 11:00:00 via INTRAVENOUS

## 2013-08-04 NOTE — H&P (Signed)
History of Present Illness Mr Theresa Harrington had ESL right proximal ureteral calculus on 2/23. JJ stent inserted on 2/4 was removed on 3/5. She passed some stone fragments. KUB 3 weeks ago revealed stone fragments in the proximal ureter. KUB today shows some residual fragments in the proximal ureter. Ultrasound of the kidney reveals moderate hydronephrosis. She does not have any flank pain. Urinalysis shows no RBC's or WBC's, 500 mgm glucose.   Past Medical History Problems  1. History of diabetes mellitus (V12.29) 2. History of hypercholesterolemia (V12.29) 3. History of hypertension (V12.59) 4. History of Microscopic hematuria (599.72)  Surgical History Problems  1. History of Abdominoplasty 2. History of Breast Surgery 3. History of Cystoscopy With Insertion Of Ureteral Stent Right 4. History of Laparosc Restrictive Proc Adjustable Gastric Band Placement 5. History of Lithotripsy 6. History of Lithotripsy  Current Meds 1. Atorvastatin Calcium TABS;  Therapy: (Recorded:16Feb2015) to Recorded 2. GlyBURIDE 5 MG Oral Tablet;  Therapy: (Recorded:20Aug2012) to Recorded 3. Invokana 100 MG Oral Tablet;  Therapy: (Recorded:16Feb2015) to Recorded 4. Levaquin 500 MG Oral Tablet (Levofloxacin);  Therapy: (Recorded:16Feb2015) to Recorded 5. Losartan Potassium TABS;  Therapy: (Recorded:16Feb2015) to Recorded 6. MetFORMIN HCl - 1000 MG Oral Tablet;  Therapy: (Recorded:20Aug2012) to Recorded 7. Oxybutynin Chloride 5 MG Oral Tablet;  Therapy: (Recorded:16Feb2015) to Recorded 8. Pantoprazole Sodium 40 MG SOLR;  Therapy: (Recorded:16Feb2015) to Recorded  Allergies Medication  1. Codeine Derivatives  Family History Problems  1. Family history of Father Deceased At Age ____   32 2. Family history of Mother Deceased At Age 71 3. Family history of Stroke Syndrome (V17.1) : Brother  Social History Problems  1. Denied: History of Alcohol Use 2. Caffeine Use   1 large cup coffee 3. Former  smoker (V15.82) 4. Marital History - Single 5. Tobacco use (305.1)   smoked 1 pk for 20 yrs / quit 10 yrs ago  Review of Systems Genitourinary, constitutional, skin, eye, otolaryngeal, hematologic/lymphatic, cardiovascular, pulmonary, endocrine, musculoskeletal, gastrointestinal, neurological and psychiatric system(s) were reviewed and pertinent findings if present are noted.    Vitals Vital Signs [Data Includes: Last 1 Day]  Recorded: [id number] 09:04AM  Blood Pressure: 105 / 64 Heart Rate: 68 Respiration: 18  Physical Exam Constitutional: Well nourished and well developed . No acute distress.  ENT:. The ears and nose are normal in appearance.  Neck: The appearance of the neck is normal and no neck mass is present.  Pulmonary: No respiratory distress and normal respiratory rhythm and effort.  Cardiovascular: Heart rate and rhythm are normal . No peripheral edema.  Abdomen: The abdomen is soft and nontender. No masses are palpated. No CVA tenderness. No hernias are palpable. No hepatosplenomegaly noted.  Lymphatics: The femoral and inguinal nodes are not enlarged or tender.  Skin: Normal skin turgor, no visible rash and no visible skin lesions.  Neuro/Psych:. Mood and affect are appropriate.    Results/Data Urine [Data Includes: Last 1 Day]   [id number]  COLOR YELLOW   APPEARANCE CLEAR   SPECIFIC GRAVITY 1.025   pH 5.5   GLUCOSE 500 mg/dL  BILIRUBIN NEG   KETONE NEG mg/dL  BLOOD NEG   PROTEIN NEG mg/dL  UROBILINOGEN 0.2 mg/dL  NITRITE NEG   LEUKOCYTE ESTERASE NEG     ULTRASOUND RIGHT KIDNEY  INDICATION: Right proximal ureteral calculi. R/O hydronephrosis.  The kidney measures 11.6 x 5.6 x 6.1 cm. There is a hypoechoic lesion of the lower pole that measures 0.4 cm. There are several hyperechoic shadows in the proximal  ureter that measure between 0.4 and 0.7 cm.  PVR is 30 ml.  IMPRESSION: Moderate right hydronephrosis. Right proximal ureteral calculi.   KUB  INDICATION: S/P ESL right urteral calculus  KUB shows normal bowel gas pattern. Psoas shadows are normal. Gastric band is in place. There are several fragments in the proximal right ureter. The largest and most distal one measures 7 mm. There are no opaque stone in either kidneys nor in the course of the left ureter.  IMPRESSION: Right proximal ureteral calculi.     Assessment Assessed  1. Hydronephrosis (591) 2. Calculus of right ureter (592.1)  Plan Calculus of right ureter  1. RENAL U/S RIGHT; Status:Resulted - Requires Verification;   Done: [id number] 12:00AM Health Maintenance  2. UA With REFLEX; [Do Not Release]; Status:Complete;   Done: [id number] 08:39AM  I discussed treatment options with the patient: repeat ESL versus ureteroscopy. She agrees to have repeat ESL and if she still has remaining stone fragments will consider ureteroscopy.

## 2013-08-04 NOTE — Op Note (Signed)
Refer to [facility] Op Note scanned in the chart 

## 2013-08-12 ENCOUNTER — Encounter: Payer: Self-pay | Admitting: Family

## 2013-08-13 ENCOUNTER — Encounter (HOSPITAL_COMMUNITY): Payer: BC Managed Care – PPO

## 2013-08-13 ENCOUNTER — Ambulatory Visit: Payer: BC Managed Care – PPO | Admitting: Family

## 2013-08-21 ENCOUNTER — Encounter: Payer: Self-pay | Admitting: Family

## 2013-08-22 ENCOUNTER — Ambulatory Visit (HOSPITAL_COMMUNITY)
Admission: RE | Admit: 2013-08-22 | Discharge: 2013-08-22 | Disposition: A | Discharge status: Home / Self Care | Payer: BC Managed Care – PPO | Source: Ambulatory Visit | Attending: Family | Admitting: Family

## 2013-08-22 ENCOUNTER — Ambulatory Visit (INDEPENDENT_AMBULATORY_CARE_PROVIDER_SITE_OTHER): Payer: BC Managed Care – PPO | Admitting: Family

## 2013-08-22 ENCOUNTER — Encounter: Payer: Self-pay | Admitting: Family

## 2013-08-22 VITALS — BP 139/79 | HR 74 | Resp 16 | Ht 66.0 in | Wt 169.0 lb

## 2013-08-22 DIAGNOSIS — I70219 Atherosclerosis of native arteries of extremities with intermittent claudication, unspecified extremity: Secondary | ICD-10-CM

## 2013-08-22 DIAGNOSIS — I739 Peripheral vascular disease, unspecified: Secondary | ICD-10-CM

## 2013-08-22 MED ORDER — CILOSTAZOL 100 MG PO TABS: 100.0000 mg | ORAL_TABLET | Freq: Every day | ORAL | Status: DC

## 2013-08-22 NOTE — Patient Instructions (Signed)
Peripheral Vascular Disease Peripheral Vascular Disease (PVD), also called Peripheral Arterial Disease (PAD), is a circulation problem caused by cholesterol (atherosclerotic plaque) deposits in the arteries. PVD commonly occurs in the lower extremities (legs) but it can occur in other areas of the body, such as your arms. The cholesterol buildup in the arteries reduces blood flow which can cause pain and other serious problems. The presence of PVD can place a person at risk for Coronary Artery Disease (CAD).  CAUSES  Causes of PVD can be many. It is usually associated with more than one risk factor such as:   High Cholesterol.  Smoking.  Diabetes.  Lack of exercise or inactivity.  High blood pressure (hypertension).  Obesity.  Family history. SYMPTOMS   When the lower extremities are affected, patients with PVD may experience:  Leg pain with exertion or physical activity. This is called INTERMITTENT CLAUDICATION. This may present as cramping or numbness with physical activity. The location of the pain is associated with the level of blockage. For example, blockage at the abdominal level (distal abdominal aorta) may result in buttock or hip pain. Lower leg arterial blockage may result in calf pain.  As PVD becomes more severe, pain can develop with less physical activity.  In people with severe PVD, leg pain may occur at rest.  Other PVD signs and symptoms:  Leg numbness or weakness.  Coldness in the affected leg or foot, especially when compared to the other leg.  A change in leg color.  Patients with significant PVD are more prone to ulcers or sores on toes, feet or legs. These may take longer to heal or may reoccur. The ulcers or sores can become infected.  If signs and symptoms of PVD are ignored, gangrene may occur. This can result in the loss of toes or loss of an entire limb.  Not all leg pain is related to PVD. Other medical conditions can cause leg pain such  as:  Blood clots (embolism) or Deep Vein Thrombosis.  Inflammation of the blood vessels (vasculitis).  Spinal stenosis. DIAGNOSIS  Diagnosis of PVD can involve several different types of tests. These can include:  Pulse Volume Recording Method (PVR). This test is simple, painless and does not involve the use of X-rays. PVR involves measuring and comparing the blood pressure in the arms and legs. An ABI (Ankle-Brachial Index) is calculated. The normal ratio of blood pressures is 1. As this number becomes smaller, it indicates more severe disease.  < 0.95  indicates significant narrowing in one or more leg vessels.  <0.8 there will usually be pain in the foot, leg or buttock with exercise.  <0.4 will usually have pain in the legs at rest.  <0.25  usually indicates limb threatening PVD.  Doppler detection of pulses in the legs. This test is painless and checks to see if you have a pulses in your legs/feet.  A dye or contrast material (a substance that highlights the blood vessels so they show up on x-ray) may be given to help your caregiver better see the arteries for the following tests. The dye is eliminated from your body by the kidney's. Your caregiver may order blood work to check your kidney function and other laboratory values before the following tests are performed:  Magnetic Resonance Angiography (MRA). An MRA is a picture study of the blood vessels and arteries. The MRA machine uses a large magnet to produce images of the blood vessels.  Computed Tomography Angiography (CTA). A CTA is a   specialized x-ray that looks at how the blood flows in your blood vessels. An IV may be inserted into your arm so contrast dye can be injected.  Angiogram. Is a procedure that uses x-rays to look at your blood vessels. This procedure is minimally invasive, meaning a small incision (cut) is made in your groin. A small tube (catheter) is then inserted into the artery of your groin. The catheter is  guided to the blood vessel or artery your caregiver wants to examine. Contrast dye is injected into the catheter. X-rays are then taken of the blood vessel or artery. After the images are obtained, the catheter is taken out. TREATMENT  Treatment of PVD involves many interventions which may include:  Lifestyle changes:  Quitting smoking.  Exercise.  Following a low fat, low cholesterol diet.  Control of diabetes.  Foot care is very important to the PVD patient. Good foot care can help prevent infection.  Medication:  Cholesterol-lowering medicine.  Blood pressure medicine.  Anti-platelet drugs.  Certain medicines may reduce symptoms of Intermittent Claudication.  Interventional/Surgical options:  Angioplasty. An Angioplasty is a procedure that inflates a balloon in the blocked artery. This opens the blocked artery to improve blood flow.  Stent Implant. A wire mesh tube (stent) is placed in the artery. The stent expands and stays in place, allowing the artery to remain open.  Peripheral Bypass Surgery. This is a surgical procedure that reroutes the blood around a blocked artery to help improve blood flow. This type of procedure may be performed if Angioplasty or stent implants are not an option. SEEK IMMEDIATE MEDICAL CARE IF:   You develop pain or numbness in your arms or legs.  Your arm or leg turns cold, becomes blue in color.  You develop redness, warmth, swelling and pain in your arms or legs. MAKE SURE YOU:   Understand these instructions.  Will watch your condition.  Will get help right away if you are not doing well or get worse. Document Released: 05/25/2004 Document Revised: 07/10/2011 Document Reviewed: 04/21/2008 ExitCare Patient Information 2014 [employer].  

## 2013-08-22 NOTE — Progress Notes (Signed)
[facility] [place] HISTORY AND PHYSICAL -PAD  History of Present Illness Theresa Harrington is a 58 y.o. female patient of Dr. Trula Slade who returns for 1 year surveillance of her PAD. She has not had previous vascular procedure. Walking about 1/4 mile, she has left calf pain, denies non healing wounds. She states sh did try the Pletal, does not recall how long she took it, it did not help, but is willing to try again, had no adverse effects as she recalls. She denies history of CHF. The patient reports New Medical or Surgical History: has right kidney stone, had a recent lithotripsy, may need surgery to remove stone.  Pt Diabetic: Yes, states her last A1C was 8.0, which is worse for her. Pt smoker: former smoker, quit 12 years ago.  Pt meds include: Statin :Yes ASA: No Other anticoagulants/antiplatelets: no  Past Medical History  Diagnosis Date  . Anemia   . Hyperlipidemia   . Hypertension   . Hypothyroidism   . Diabetes mellitus     type II  . Peripheral vascular disease   . Chronic kidney disease     stones  . PONV (postoperative nausea and vomiting)   . Gallstones     Social History History  Substance Use Topics  . Smoking status: Former Smoker -- 20 years    Quit date: 09/06/2002  . Smokeless tobacco: Never Used  . Alcohol Use: No    Family History Family History  Problem Relation Age of Onset  . Arthritis Other   . Cancer Other     colon  . Hypertension Other   . Stroke Other   . Diabetes Father   . Heart disease Father   . Deep vein thrombosis Father   . Hyperlipidemia Father   . Diabetes Sister   . Heart disease Sister   . Deep vein thrombosis Sister   . Hyperlipidemia Sister   . Diabetes Brother   . Heart disease Brother   . Cancer Brother   . Hyperlipidemia Brother   . Colon cancer Maternal Aunt   . Diabetes Brother   . Diabetes Brother   . Kidney disease Mother   . Hyperlipidemia Mother   . Other Mother     AAA   and     Amputation    Past Surgical History  Procedure Laterality Date  . Gastric bypass    . Hand surgery      right hand  . Knee surgery      right   . Thigh lift      Bil  . Gastric banding port revision      2007  . Belpharoptosis repair      eyelid lift  . Cystoscopy with stent placement Right 06/04/2013    Procedure: CYSTOSCOPY WITH STENT PLACEMENT;  Surgeon: Franchot Gallo, MD;  Location: [facility];  Service: Urology;  Laterality: Right;  . Breast surgery      breast lift/bil  . Lithotripsy Right August 04, 2013    Allergies  Allergen Reactions  . Ampicillin Nausea And Vomiting  . Codeine Nausea And Vomiting  . Lisinopril     REACTION: cough  . Penicillins Nausea And Vomiting  . Januvia [Sitagliptin]     nausea    Current Outpatient Prescriptions  Medication Sig Dispense Refill  . atorvastatin (LIPITOR) 80 MG tablet Take 80 mg by mouth daily.      . Canagliflozin (INVOKANA) 100 MG TABS Take 100 mg by mouth daily.      [name]  glyBURIDE (DIABETA) 5 MG tablet Take 1 tablet (5 mg total) by mouth daily.  90 tablet  0  . metFORMIN (GLUCOPHAGE) 1000 MG tablet Take 1,000 mg by mouth 2 (two) times daily.      . Olmesartan-Amlodipine-HCTZ (TRIBENZOR) 40-5-12.5 MG TABS Take 1 tablet by mouth daily.  90 tablet  0  . pantoprazole (PROTONIX) 40 MG tablet Take 40 mg by mouth daily.       No current facility-administered medications for this visit.    ROS: See HPI for pertinent positives and negatives.   Physical Examination  Filed Vitals:   08/22/13 1144  BP: 139/79  Pulse: 74  Resp: 16   Filed Weights   08/22/13 1144  Weight: 169 lb (76.658 kg)   Body mass index is 27.29 kg/(m^2).  General: A&O x 3, WDWN. Gait: normal Eyes: PERRLA. Pulmonary: CTAB, without wheezes , rales or rhonchi. Cardiac: regular Rythm , without detected murmur.         Carotid Bruits Left Right   Negative Negative  Aorta is not palpable. Radial pulses: 2+ palable                            VASCULAR EXAM: Extremities without ischemic changes  without Gangrene; without open wounds.                                                                                                          LE Pulses LEFT RIGHT       FEMORAL   palpable   palpable        POPLITEAL  not palpable   not palpable       POSTERIOR TIBIAL  not palpable   not palpable        DORSALIS PEDIS      ANTERIOR TIBIAL not palpable  not palpable    Abdomen: soft, NT, no masses. Skin: no rashes, no ulcers noted. Musculoskeletal: no muscle wasting or atrophy.  Neurologic: A&O X 3; Appropriate Affect ; SENSATION: normal; MOTOR FUNCTION:  moving all extremities equally, motor strength 5/5 throughout. Speech is fluent/normal. CN 2-12 intact.    Non-Invasive Vascular Imaging: DATE: 08/22/2013 ABI: RIGHT 0.70, Waveforms: monophasic;  LEFT 0.60, Waveforms: monophasic Previous ABI's (07/08/12): Right: 0.91, Left: 0.60   ASSESSMENT: Theresa Harrington is a 58 y.o. female who presents with: left calf claudication symptoms after walking about 1/4 mile. 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  Her risk factors for progression of atherosclerosis are worsening of her DM control and decreased walking. Again try Pletal, start once daily, can increase to twice daily if no improvement in claudication symptoms after 2 weeks.  PLAN:  Graduated walking program. Again try Pletal. Work closely with her provider that helps her manage her DM.  I discussed in depth with the patient the nature of atherosclerosis, and emphasized the importance of maximal medical management including strict control of blood pressure, blood glucose, and lipid levels, obtaining regular  exercise, and continued cessation of smoking.  The patient is aware that without maximal medical management the underlying atherosclerotic disease process will progress, limiting the benefit of any  interventions.  Based on the patient's vascular studies and examination, pt will return to clinic in 6 months for ABI's.  The patient was given information about PAD including signs, symptoms, treatment, what symptoms should prompt the patient to seek immediate medical care, and risk reduction measures to take.  Clemon Chambers, RN, MSN, FNP-C [facility] Phone: [phone]  Clinic MD: Bridgett Larsson  08/22/2013 11:58 AM

## 2013-08-22 NOTE — Addendum Note (Signed)
Addended by: Dorthula Rue L on: 08/22/2013 04:27 PM   Modules accepted: Orders

## 2013-08-27 ENCOUNTER — Telehealth (HOSPITAL_COMMUNITY): Payer: Self-pay

## 2013-08-27 NOTE — Telephone Encounter (Signed)
This patient is overdue for recommended follow-up with a bariatric surgeon at [facility]. Call attempted today to reestablish post-op care with [facility]. Patient was concerned that her insurance may no longer cover her appointment. Spoke with Sherion at [employer] and she will call the patient tomorrow as well as follow-up with her current carrier to verify benefits.   Alphonsa Overall. [facility] Bariatric Office Coordinator [phone]

## 2013-09-02 LAB — HM DIABETES EYE EXAM

## 2013-09-03 ENCOUNTER — Ambulatory Visit (INDEPENDENT_AMBULATORY_CARE_PROVIDER_SITE_OTHER): Payer: 59 | Admitting: Internal Medicine

## 2013-09-03 ENCOUNTER — Encounter: Payer: Self-pay | Admitting: Internal Medicine

## 2013-09-03 ENCOUNTER — Ambulatory Visit: Payer: BC Managed Care – PPO | Admitting: Internal Medicine

## 2013-09-03 VITALS — BP 120/52 | HR 62 | Temp 97.8°F | Resp 16 | Wt 171.0 lb

## 2013-09-03 DIAGNOSIS — I10 Essential (primary) hypertension: Secondary | ICD-10-CM

## 2013-09-03 DIAGNOSIS — I491 Atrial premature depolarization: Secondary | ICD-10-CM

## 2013-09-03 DIAGNOSIS — E1159 Type 2 diabetes mellitus with other circulatory complications: Secondary | ICD-10-CM

## 2013-09-03 MED ORDER — AMLODIPINE-OLMESARTAN 5-40 MG PO TABS
1.0000 | ORAL_TABLET | Freq: Every day | ORAL | Status: DC
Start: 2013-09-03 — End: 2013-10-03

## 2013-09-03 NOTE — Progress Notes (Signed)
Subjective:    Patient ID: Theresa Harrington, female    DOB: [birth year], 58 y.o.   MRN: [id number]  Hypertension This is a chronic problem. The problem has been rapidly improving since onset. The problem is controlled. Associated symptoms include malaise/fatigue and shortness of breath. Pertinent negatives include no anxiety, blurred vision, chest pain, headaches, neck pain, orthopnea, palpitations, peripheral edema, PND or sweats. (Her SBP has been down to 85-100 mmHG and she feels felt fatigued and has had some SOB and DOE.) There are no associated agents to hypertension. Past treatments include angiotensin blockers, calcium channel blockers and diuretics. There are no compliance problems.       Review of Systems  Constitutional: Positive for malaise/fatigue and fatigue. Negative for fever, chills, diaphoresis, appetite change and unexpected weight change.  HENT: Negative.   Eyes: Negative.  Negative for blurred vision.  Respiratory: Positive for shortness of breath. Negative for apnea, cough, choking and chest tightness.   Cardiovascular: Negative.  Negative for chest pain, palpitations, orthopnea, leg swelling and PND.  Gastrointestinal: Negative.  Negative for nausea, vomiting and abdominal pain.  Endocrine: Negative.   Genitourinary: Negative.  Negative for dysuria, urgency, hematuria, flank pain and difficulty urinating.  Musculoskeletal: Negative.  Negative for neck pain.  Allergic/Immunologic: Negative.   Neurological: Positive for light-headedness. Negative for dizziness, tremors, syncope, weakness, numbness and headaches.  Hematological: Negative.   Psychiatric/Behavioral: Negative.        Objective:   Physical Exam  Vitals reviewed. Constitutional: She is oriented to person, place, and time. She appears well-developed and well-nourished. No distress.  HENT:  Head: Normocephalic and atraumatic.  Mouth/Throat: Oropharynx is clear and moist. No oropharyngeal exudate.  Eyes:  Conjunctivae are normal. Right eye exhibits no discharge. Left eye exhibits no discharge. No scleral icterus.  Neck: Normal range of motion. Neck supple. No JVD present. No tracheal deviation present. No thyromegaly present.  Cardiovascular: Normal rate, regular rhythm and intact distal pulses.   Occasional extrasystoles are present. Exam reveals gallop and S3. Exam reveals no friction rub.   No murmur heard. Pulses:      Carotid pulses are 1+ on the right side, and 1+ on the left side.      Radial pulses are 1+ on the right side, and 1+ on the left side.       Femoral pulses are 1+ on the right side, and 1+ on the left side.      Popliteal pulses are 1+ on the right side, and 1+ on the left side.       Dorsalis pedis pulses are 1+ on the right side, and 1+ on the left side.       Posterior tibial pulses are 1+ on the right side, and 1+ on the left side.  Pulmonary/Chest: Effort normal and breath sounds normal. No stridor. No respiratory distress. She has no wheezes. She has no rales. She exhibits no tenderness.  Abdominal: Soft. Bowel sounds are normal. She exhibits no distension and no mass. There is no tenderness. There is no rebound and no guarding.  Musculoskeletal: Normal range of motion. She exhibits no edema and no tenderness.  Lymphadenopathy:    She has no cervical adenopathy.  Neurological: She is oriented to person, place, and time.  Skin: Skin is warm and dry. No rash noted. She is not diaphoretic. No erythema. No pallor.     Lab Results  Component Value Date   WBC 10.0 06/04/2013   HGB 12.1 06/04/2013   HCT  37.6 06/04/2013   PLT 286 06/04/2013   GLUCOSE 87 07/09/2013   CHOL 137 08/06/2012   TRIG 103.0 08/06/2012   HDL 53.60 08/06/2012   LDLDIRECT 160.2 03/01/2010   LDLCALC 63 08/06/2012   ALT 17 10/07/2012   AST 18 10/07/2012   NA 139 07/09/2013   K 4.0 07/09/2013   CL 104 07/09/2013   CREATININE 0.6 07/09/2013   BUN 12 07/09/2013   CO2 30 07/09/2013   TSH 0.58 07/09/2013   HGBA1C 8.1*  07/09/2013   MICROALBUR 3.5* 03/01/2010       Assessment & Plan:

## 2013-09-03 NOTE — Progress Notes (Signed)
Pre visit review using our clinic review tool, if applicable. No additional management support is needed unless otherwise documented below in the visit note. 

## 2013-09-03 NOTE — Patient Instructions (Signed)
Premature Beats °A premature beat is an extra heartbeat that happens earlier than normal. Premature beats are called premature atrial contractions (PACs) or premature ventricular contractions (PVCs) depending on the area of the heart where they start. °CAUSES  °Premature beats may be brought on by a variety of factors including: °· Emotional stress. °· Lack of sleep. °· Caffeine. °· Asthma medicines. °· Stimulants. °· Herbal teas. °· Dietary supplements. °· Alcohol. °In most cases, premature beats are not dangerous and are not a sign of serious heart disease. Most patients evaluated for premature beats have completely normal heart function. Rarely, premature beats may be a sign of more significant heart problems or medical illness. °SYMPTOMS  °Premature beats may cause palpitations. This means you feel like your heart is skipping a beat or beating harder than usual. Sometimes, slight chest pain occurs with premature beats, lasting only a few seconds. This pain has been described as a "flopping" feeling inside the chest. In many cases, premature beats do not cause any symptoms and they are only detected when an electrocardiography test (EKG) or heart monitoring is performed. °DIAGNOSIS  °Your caregiver may run some tests to evaluate your heart such as an EKG or echocardiography. You may need to wear a portable heart monitor for several days to record the electrical activity of your heart. Blood testing may also be performed to check your electrolytes and thyroid function. °TREATMENT  °Premature beats usually go away with rest. If the problem continues, your caregiver will determine a treatment plan for you.  °HOME CARE INSTRUCTIONS °· Get plenty of rest over the next few days until your symptoms improve. °· Avoid coffee, tea, alcohol, and soda (pop, cola). °· Do not smoke. °SEEK MEDICAL CARE IF: °· Your symptoms continue after 1 to 2 days of rest. °· You have new symptoms, such as chest pain or trouble  breathing. °SEEK IMMEDIATE MEDICAL CARE IF: °· You have severe chest pain or abdominal pain. °· You have pain that radiates into the neck, arm, or jaw. °· You faint or have extreme weakness. °· You have shortness of breath. °· Your heartbeat races for more than 5 seconds. °MAKE SURE YOU: °· Understand these instructions. °· Will watch your condition. °· Will get help right away if you are not doing well or get worse. °Document Released: 05/25/2004 Document Revised: 07/10/2011 Document Reviewed: 12/19/2010 °Exit[employer]® Patient Information ©2014 [employer]. ° °

## 2013-09-04 NOTE — Assessment & Plan Note (Addendum)
She has a few PVC's on the EKG with tachycardia and RAE/PRWP and she has developed some s/s concerning for CHF I have asked he to see cardiology for further evaluation

## 2013-09-04 NOTE — Assessment & Plan Note (Signed)
Her BP is too low Will stop the HCTZ and treat the BP with Azor

## 2013-09-15 ENCOUNTER — Encounter: Payer: Self-pay | Admitting: Cardiology

## 2013-09-15 ENCOUNTER — Telehealth: Payer: Self-pay

## 2013-09-15 ENCOUNTER — Ambulatory Visit: Payer: BC Managed Care – PPO | Admitting: Surgery

## 2013-09-15 NOTE — Telephone Encounter (Signed)
Phone call from pt.  Reported she has developed a small cut between her 4th and 5th, toe right foot.  Reported she has seen her foot doctor, and has been prescribed a cream to treat a poss. fungal infection.  Stated she is supposed to see her foot doctor in June again, and if no improvement, will be redirected to Dr. Trula Slade.  Questions if she needs to go ahead and sched. appt. with Dr. Trula Slade.  Per Dr. Trula Slade, it is advised to come back to office for further eval.  Left message with pt. that appt. will be scheduled.

## 2013-09-15 NOTE — Telephone Encounter (Signed)
Spoke with pt to schedule, she opted to wait until next week due to insurance issues, dpm

## 2013-09-17 ENCOUNTER — Telehealth: Payer: Self-pay | Admitting: Internal Medicine

## 2013-09-17 DIAGNOSIS — E1159 Type 2 diabetes mellitus with other circulatory complications: Secondary | ICD-10-CM

## 2013-09-17 NOTE — Telephone Encounter (Signed)
Pt needs a referral to an eye doctor for check-ups because she is diabetic.  She has [employer].

## 2013-09-19 ENCOUNTER — Encounter: Payer: Self-pay | Admitting: Family

## 2013-09-23 ENCOUNTER — Encounter: Payer: Self-pay | Admitting: Family

## 2013-09-23 ENCOUNTER — Ambulatory Visit (INDEPENDENT_AMBULATORY_CARE_PROVIDER_SITE_OTHER): Payer: BC Managed Care – PPO | Admitting: Family

## 2013-09-23 VITALS — BP 130/79 | HR 65 | Resp 16 | Ht 66.5 in | Wt 170.0 lb

## 2013-09-23 DIAGNOSIS — L988 Other specified disorders of the skin and subcutaneous tissue: Secondary | ICD-10-CM

## 2013-09-23 DIAGNOSIS — R234 Changes in skin texture: Secondary | ICD-10-CM

## 2013-09-23 NOTE — Progress Notes (Signed)
[facility] [place] HISTORY AND PHYSICAL -PAD  History of Present Illness Theresa Harrington is a 58 y.o. female patient of Dr. Trula Slade who returns for c/o cut between 4th and 5th toe, she has been followed for PAD, no vascular procedures performed.  She was recently evaluated and treated by her podiatrist for tinea pedis, using a topical antifungal agent on her feet; pt states her podiatrist asked her to be evaluated by this office for evidence of vascular disease. She has not had previous vascular procedure.  Walking about 1/4 mile, she has left calf pain, denies non healing wounds.  ABI's last month had downgraded from normal to moderate arterial occlusive disease in right leg, and stable in left leg.  She is walking more. Her risk factors for progression of atherosclerosis are worsening of her DM control and decreased walking.   She is taking one Pletal daily, has not noticed a difference in her legs claudication She denies history of CHF.  The patient reports New Medical or Surgical History: still has a kidney stone.  Pt Diabetic: Yes,  A1C in March, 2015 was 8.1.  Pt smoker: former smoker, quit 12 years ago.  Pt meds include:  Statin :Yes  ASA: yes   Other anticoagulants/antiplatelets: no   Past Medical History  Diagnosis Date  . Anemia   . Hyperlipidemia   . Hypertension   . Hypothyroidism   . Diabetes mellitus     type II  . Peripheral vascular disease   . Chronic kidney disease     stones  . PONV (postoperative nausea and vomiting)   . Gallstones     Social History History  Substance Use Topics  . Smoking status: Former Smoker -- 20 years    Quit date: 09/06/2002  . Smokeless tobacco: Never Used  . Alcohol Use: No    Family History Family History  Problem Relation Age of Onset  . Arthritis Other   . Cancer Other     colon  . Hypertension Other   . Stroke Other   . Diabetes Father   . Heart disease Father   . Deep vein thrombosis Father    . Hyperlipidemia Father   . Diabetes Sister   . Heart disease Sister   . Deep vein thrombosis Sister   . Hyperlipidemia Sister   . Diabetes Brother   . Heart disease Brother   . Cancer Brother   . Hyperlipidemia Brother   . Colon cancer Maternal Aunt   . Diabetes Brother   . Diabetes Brother   . Kidney disease Mother   . Hyperlipidemia Mother   . Other Mother     AAA   and    Amputation    Past Surgical History  Procedure Laterality Date  . Gastric bypass    . Hand surgery      right hand  . Knee surgery      right   . Thigh lift      Bil  . Gastric banding port revision      2007  . Belpharoptosis repair      eyelid lift  . Cystoscopy with stent placement Right 06/04/2013    Procedure: CYSTOSCOPY WITH STENT PLACEMENT;  Surgeon: Franchot Gallo, MD;  Location: [facility];  Service: Urology;  Laterality: Right;  . Breast surgery      breast lift/bil  . Lithotripsy Right August 04, 2013    Allergies  Allergen Reactions  . Ampicillin Nausea And Vomiting  .  Codeine Nausea And Vomiting  . Lisinopril     REACTION: cough  . Penicillins Nausea And Vomiting  . Januvia [Sitagliptin]     nausea    Current Outpatient Prescriptions  Medication Sig Dispense Refill  . amLODipine-olmesartan (AZOR) 5-40 MG per tablet Take 1 tablet by mouth daily.  56 tablet  0  . atorvastatin (LIPITOR) 80 MG tablet Take 80 mg by mouth daily.      . Canagliflozin (INVOKANA) 100 MG TABS Take 100 mg by mouth daily.      . cilostazol (PLETAL) 100 MG tablet Take 1 tablet (100 mg total) by mouth daily before breakfast.  30 tablet  11  . glyBURIDE (DIABETA) 5 MG tablet Take 1 tablet (5 mg total) by mouth daily.  90 tablet  0  . metFORMIN (GLUCOPHAGE) 1000 MG tablet Take 1,000 mg by mouth 2 (two) times daily.      . pantoprazole (PROTONIX) 40 MG tablet Take 40 mg by mouth daily.       No current facility-administered medications for this visit.    ROS: See HPI for pertinent positives and  negatives.   Physical Examination  Filed Vitals:   09/23/13 1413  BP: 130/79  Pulse: 65  Resp: 16  Height: 5' 6.5" (1.689 m)  Weight: 170 lb (77.111 kg)  SpO2: 100%   Body mass index is 27.03 kg/(m^2).  General: A&O x 3, WDWN.  Gait: normal  Eyes: PERRLA.  Pulmonary: Respirations non labored. Cardiac: regular Rythm Carotid Bruits  Left  Right    Negative  Negative    Aorta is not palpable.  Radial pulses: 2+ palable   VASCULAR EXAM:  Extremities without ischemic changes  without Gangrene; without open wounds.  Abdomen: soft, NT, no masses.  Skin: no rashes, no ulcers noted. She has shallow breaks in skin at plantar surface of both feet, base of 5th toes, and scaly soles, typical of tinea pedis, no secondary infections, no ulcerations.   Musculoskeletal: no muscle wasting or atrophy.  Neurologic: A&O X 3; Appropriate Affect ; SENSATION: normal; MOTOR FUNCTION: moving all extremities equally, motor strength 5/5 throughout. Speech is fluent/normal. CN 2-12 intact.    ASSESSMENT: Theresa Harrington is a 57 y.o. female who has a mild case of tinea pedis with a history of PAD and continued uncontrolled DM, but has no secondary infections and no evidence of PAD ulcerations.    PLAN:  Continue topical medication prescribed by her podiatrist, keep feet clean and dry, follow up with her podiatrist as scheduled. Continue graduated walking program. I discussed in depth with the patient the nature of atherosclerosis, and emphasized the importance of maximal medical management including strict control of blood pressure, blood glucose, and lipid levels, obtaining regular exercise, and continued cessation of smoking.  The patient is aware that without maximal medical management the underlying atherosclerotic disease process will progress, limiting the benefit of any interventions.  Based on the patient's vascular studies and examination, pt will return to clinic in 5 months as already  scheduled.  The patient was given information about PAD including signs, symptoms, treatment, what symptoms should prompt the patient to seek immediate medical care, and risk reduction measures to take.  Clemon Chambers, RN, MSN, FNP-C [facility] Phone: [phone]  Clinic MD: Kellie Simmering  09/23/2013 2:12 PM

## 2013-09-25 ENCOUNTER — Encounter (INDEPENDENT_AMBULATORY_CARE_PROVIDER_SITE_OTHER): Payer: Self-pay | Admitting: Surgery

## 2013-09-26 ENCOUNTER — Other Ambulatory Visit: Payer: Self-pay | Admitting: Urology

## 2013-10-01 ENCOUNTER — Encounter (HOSPITAL_BASED_OUTPATIENT_CLINIC_OR_DEPARTMENT_OTHER): Payer: Self-pay | Admitting: *Deleted

## 2013-10-01 ENCOUNTER — Ambulatory Visit: Payer: Self-pay | Admitting: Cardiology

## 2013-10-03 ENCOUNTER — Encounter (HOSPITAL_BASED_OUTPATIENT_CLINIC_OR_DEPARTMENT_OTHER): Payer: Self-pay | Admitting: *Deleted

## 2013-10-03 NOTE — Progress Notes (Signed)
NPO AFTER MN.  ARRIVE AT [id number]. NEEDS ISTAT.  CURRENT EKG IN CHART AND EPIC. WILL TAKE PROTONIX AND AZOR AM DOS W/ SIPS OF WATER.

## 2013-10-07 ENCOUNTER — Encounter (HOSPITAL_BASED_OUTPATIENT_CLINIC_OR_DEPARTMENT_OTHER): Payer: 59 | Admitting: Anesthesiology

## 2013-10-07 ENCOUNTER — Encounter (HOSPITAL_BASED_OUTPATIENT_CLINIC_OR_DEPARTMENT_OTHER)
Admission: RE | Disposition: A | Discharge status: Home / Self Care | Payer: Self-pay | Source: Ambulatory Visit | Attending: Urology

## 2013-10-07 ENCOUNTER — Ambulatory Visit (HOSPITAL_BASED_OUTPATIENT_CLINIC_OR_DEPARTMENT_OTHER)
Admission: RE | Admit: 2013-10-07 | Discharge: 2013-10-07 | Disposition: A | Discharge status: Home / Self Care | Payer: 59 | Source: Ambulatory Visit | Attending: Urology | Admitting: Urology

## 2013-10-07 ENCOUNTER — Encounter (HOSPITAL_BASED_OUTPATIENT_CLINIC_OR_DEPARTMENT_OTHER): Payer: Self-pay

## 2013-10-07 ENCOUNTER — Ambulatory Visit (HOSPITAL_BASED_OUTPATIENT_CLINIC_OR_DEPARTMENT_OTHER): Payer: 59 | Admitting: Anesthesiology

## 2013-10-07 DIAGNOSIS — Z87891 Personal history of nicotine dependence: Secondary | ICD-10-CM | POA: Insufficient documentation

## 2013-10-07 DIAGNOSIS — N201 Calculus of ureter: Secondary | ICD-10-CM | POA: Insufficient documentation

## 2013-10-07 DIAGNOSIS — Z79899 Other long term (current) drug therapy: Secondary | ICD-10-CM | POA: Insufficient documentation

## 2013-10-07 DIAGNOSIS — E119 Type 2 diabetes mellitus without complications: Secondary | ICD-10-CM | POA: Insufficient documentation

## 2013-10-07 DIAGNOSIS — I10 Essential (primary) hypertension: Secondary | ICD-10-CM | POA: Insufficient documentation

## 2013-10-07 DIAGNOSIS — E785 Hyperlipidemia, unspecified: Secondary | ICD-10-CM | POA: Insufficient documentation

## 2013-10-07 DIAGNOSIS — Z885 Allergy status to narcotic agent status: Secondary | ICD-10-CM | POA: Insufficient documentation

## 2013-10-07 HISTORY — DX: Disorder of arteries and arterioles, unspecified: I77.9

## 2013-10-07 HISTORY — DX: Type 2 diabetes mellitus without complications: E11.9

## 2013-10-07 HISTORY — DX: Presence of spectacles and contact lenses: Z97.3

## 2013-10-07 HISTORY — DX: Calculus of ureter: N20.1

## 2013-10-07 HISTORY — DX: Personal history of urinary calculi: Z87.442

## 2013-10-07 HISTORY — DX: Gastro-esophageal reflux disease without esophagitis: K21.9

## 2013-10-07 HISTORY — DX: Calculus of gallbladder without cholecystitis without obstruction: K80.20

## 2013-10-07 HISTORY — PX: HOLMIUM LASER APPLICATION: SHX5852

## 2013-10-07 HISTORY — DX: Peripheral vascular disease, unspecified: I73.9

## 2013-10-07 HISTORY — DX: Atherosclerosis of aorta: I70.0

## 2013-10-07 HISTORY — PX: CYSTOSCOPY WITH RETROGRADE PYELOGRAM, URETEROSCOPY AND STENT PLACEMENT: SHX5789

## 2013-10-07 LAB — POCT I-STAT 4, (NA,K, GLUC, HGB,HCT)
Glucose, Bld: 133 mg/dL — ABNORMAL HIGH (ref 70–99)
HCT: 35 % — ABNORMAL LOW (ref 36.0–46.0)
Hemoglobin: 11.9 g/dL — ABNORMAL LOW (ref 12.0–15.0)
POTASSIUM: 3.9 meq/L (ref 3.7–5.3)
Sodium: 141 mEq/L (ref 137–147)

## 2013-10-07 LAB — GLUCOSE, CAPILLARY: GLUCOSE-CAPILLARY: 139 mg/dL — AB (ref 70–99)

## 2013-10-07 SURGERY — CYSTOURETEROSCOPY, WITH RETROGRADE PYELOGRAM AND STENT INSERTION
Anesthesia: General | Site: Ureter | Laterality: Right

## 2013-10-07 MED ORDER — SODIUM CHLORIDE 0.9 % IR SOLN
Status: DC | PRN
  Administered 2013-10-07: 1000 mL via INTRAVESICAL
  Administered 2013-10-07: 3000 mL via INTRAVESICAL

## 2013-10-07 MED ORDER — MEPERIDINE HCL 25 MG/ML IJ SOLN
6.2500 mg | INTRAMUSCULAR | Status: DC | PRN
  Filled 2013-10-07: qty 1

## 2013-10-07 MED ORDER — PROPOFOL 10 MG/ML IV BOLUS
INTRAVENOUS | Status: DC | PRN
  Administered 2013-10-07: 180 mg via INTRAVENOUS

## 2013-10-07 MED ORDER — FENTANYL CITRATE 0.05 MG/ML IJ SOLN
INTRAMUSCULAR | Status: DC | PRN
  Administered 2013-10-07: 25 ug via INTRAVENOUS
  Administered 2013-10-07: 50 ug via INTRAVENOUS
  Administered 2013-10-07: 25 ug via INTRAVENOUS

## 2013-10-07 MED ORDER — DEXAMETHASONE SODIUM PHOSPHATE 4 MG/ML IJ SOLN
INTRAMUSCULAR | Status: DC | PRN
  Administered 2013-10-07: 10 mg via INTRAVENOUS

## 2013-10-07 MED ORDER — FENTANYL CITRATE 0.05 MG/ML IJ SOLN
25.0000 ug | INTRAMUSCULAR | Status: DC | PRN
  Filled 2013-10-07: qty 1

## 2013-10-07 MED ORDER — EPHEDRINE SULFATE 50 MG/ML IJ SOLN
INTRAMUSCULAR | Status: DC | PRN
  Administered 2013-10-07 (×2): 10 mg via INTRAVENOUS

## 2013-10-07 MED ORDER — MIDAZOLAM HCL 2 MG/2ML IJ SOLN
INTRAMUSCULAR | Status: AC
  Filled 2013-10-07: qty 2

## 2013-10-07 MED ORDER — LACTATED RINGERS IV SOLN
INTRAVENOUS | Status: DC
  Filled 2013-10-07: qty 1000

## 2013-10-07 MED ORDER — LACTATED RINGERS IV SOLN
INTRAVENOUS | Status: DC
  Administered 2013-10-07: 10:00:00 via INTRAVENOUS
  Filled 2013-10-07: qty 1000

## 2013-10-07 MED ORDER — FENTANYL CITRATE 0.05 MG/ML IJ SOLN
INTRAMUSCULAR | Status: AC
  Filled 2013-10-07: qty 6

## 2013-10-07 MED ORDER — KETOROLAC TROMETHAMINE 30 MG/ML IJ SOLN
INTRAMUSCULAR | Status: DC | PRN
  Administered 2013-10-07: 30 mg via INTRAVENOUS

## 2013-10-07 MED ORDER — ONDANSETRON HCL 4 MG/2ML IJ SOLN
INTRAMUSCULAR | Status: DC | PRN
  Administered 2013-10-07: 4 mg via INTRAVENOUS

## 2013-10-07 MED ORDER — LIDOCAINE HCL (CARDIAC) 20 MG/ML IV SOLN
INTRAVENOUS | Status: DC | PRN
  Administered 2013-10-07: 60 mg via INTRAVENOUS

## 2013-10-07 MED ORDER — HYDROMORPHONE HCL 2 MG PO TABS: 2.0000 mg | ORAL_TABLET | ORAL | Status: DC | PRN

## 2013-10-07 MED ORDER — MIDAZOLAM HCL 5 MG/5ML IJ SOLN
INTRAMUSCULAR | Status: DC | PRN
  Administered 2013-10-07: 2 mg via INTRAVENOUS

## 2013-10-07 MED ORDER — IOHEXOL 350 MG/ML SOLN
INTRAVENOUS | Status: DC | PRN
  Administered 2013-10-07: 20 mL via URETHRAL

## 2013-10-07 MED ORDER — PROMETHAZINE HCL 25 MG/ML IJ SOLN
6.2500 mg | INTRAMUSCULAR | Status: DC | PRN
  Filled 2013-10-07: qty 1

## 2013-10-07 MED ORDER — CEFAZOLIN SODIUM 1-5 GM-% IV SOLN
1.0000 g | INTRAVENOUS | Status: AC
  Administered 2013-10-07: 2 g via INTRAVENOUS
  Filled 2013-10-07: qty 50

## 2013-10-07 SURGICAL SUPPLY — 28 items
6 X 24 CONTOUR STENT ×2 IMPLANT
ADAPTER CATH URET PLST 4-6FR (CATHETERS) IMPLANT
ADPR CATH URET STRL DISP 4-6FR (CATHETERS)
BAG DRAIN URO-CYSTO SKYTR STRL (DRAIN) ×3 IMPLANT
BAG DRN UROCATH (DRAIN) ×1
BASKET ZERO TIP NITINOL 2.4FR (BASKET) ×2 IMPLANT
BSKT STON RTRVL ZERO TP 2.4FR (BASKET) ×1
CANISTER SUCT LVC 12 LTR MEDI- (MISCELLANEOUS) ×2 IMPLANT
CATH INTERMIT  6FR 70CM (CATHETERS) ×2 IMPLANT
CATH URET 5FR 28IN CONE TIP (BALLOONS) ×2
CATH URET 5FR 70CM CONE TIP (BALLOONS) IMPLANT
CLOTH BEACON ORANGE TIMEOUT ST (SAFETY) ×3 IMPLANT
DRAPE CAMERA CLOSED 9X96 (DRAPES) IMPLANT
FIBER LASER FLEXIVA 365 (UROLOGICAL SUPPLIES) ×2 IMPLANT
GLOVE BIO SURGEON STRL SZ7 (GLOVE) ×3 IMPLANT
GLOVE BIOGEL M STER SZ 6 (GLOVE) ×2 IMPLANT
GLOVE INDICATOR 6.5 STRL GRN (GLOVE) ×4 IMPLANT
GOWN STRL REUS W/TWL LRG LVL3 (GOWN DISPOSABLE) ×2 IMPLANT
GOWN STRL REUS W/TWL XL LVL3 (GOWN DISPOSABLE) ×2 IMPLANT
GUIDEWIRE 0.038 PTFE COATED (WIRE) ×1 IMPLANT
GUIDEWIRE ANG ZIPWIRE 038X150 (WIRE) ×2 IMPLANT
GUIDEWIRE STR DUAL SENSOR (WIRE) ×2 IMPLANT
NS IRRIG 500ML POUR BTL (IV SOLUTION) IMPLANT
PACK CYSTOSCOPY (CUSTOM PROCEDURE TRAY) ×3 IMPLANT
SHEATH URET ACCESS 12FR/35CM (UROLOGICAL SUPPLIES) ×2 IMPLANT
STENT URET 6FRX24 CONTOUR (STENTS) ×2 IMPLANT
SYRINGE 10CC LL (SYRINGE) ×2 IMPLANT
SYRINGE IRR TOOMEY STRL 70CC (SYRINGE) ×2 IMPLANT

## 2013-10-07 NOTE — Discharge Instructions (Addendum)
Post Anesthesia Home Care Instructions  Activity: Get plenty of rest for the remainder of the day. A responsible adult should stay with you for 24 hours following the procedure.  For the next 24 hours, DO NOT: -Drive a car -Operate machinery -Drink alcoholic beverages -Take any medication unless instructed by your physician -Make any legal decisions or sign important papers.  Meals: Start with liquid foods such as gelatin or soup. Progress to regular foods as tolerated. Avoid greasy, spicy, heavy foods. If nausea and/or vomiting occur, drink only clear liquids until the nausea and/or vomiting subsides. Call your physician if vomiting continues.  Special Instructions/Symptoms: Your throat may feel dry or sore from the anesthesia or the breathing tube placed in your throat during surgery. If this causes discomfort, gargle with warm salt water. The discomfort should disappear within 24 hours.      [facility] [phone] Post Ureteroscopy With or Without Stent Instructions  Definitions:  Ureter: The duct that transports urine from the kidney to the bladder. Stent:   A plastic hollow tube that is placed into the ureter, from the kidney to the bladder to prevent the ureter from swelling shut.  GENERAL INSTRUCTIONS:  Despite the fact that no skin incisions were used, the area around the ureter and bladder is raw and irritated. The stent is a foreign body which will further irritate the bladder wall. This irritation is manifested by increased frequency of urination, both day and night, and by an increase in the urge to urinate. In some, the urge to urinate is present almost always. Sometimes the urge is strong enough that you may not be able to stop yourself from urinating. The only real cure is to remove the stent and then give time for the bladder wall to heal which can't be done until the danger of the ureter swelling shut has passed, which varies.  You may see some  blood in your urine while the stent is in place and a few days afterwards. Do not be alarmed, even if the urine was clear for a while. Get off your feet and drink lots of fluids until clearing occurs. If you start to pass clots or don't improve, call us.  DIET: You may return to your normal diet immediately. Because of the raw surface of your bladder, alcohol, spicy foods, acid type foods and drinks with caffeine may cause irritation or frequency and should be used in moderation. To keep your urine flowing freely and to avoid constipation, drink plenty of fluids during the day ( 8-10 glasses ). Tip: Avoid cranberry juice because it is very acidic.  ACTIVITY: Your physical activity doesn't need to be restricted. However, if you are very active, you may see some blood in your urine. We suggest that you reduce your activity under these circumstances until the bleeding has stopped.  BOWELS: It is important to keep your bowels regular during the postoperative period. Straining with bowel movements can cause bleeding. A bowel movement every other day is reasonable. Use a mild laxative if needed, such as Milk of Magnesia 2-3 tablespoons, or 2 Dulcolax tablets. Call if you continue to have problems. If you have been taking narcotics for pain, before, during or after your surgery, you may be constipated. Take a laxative if necessary.   MEDICATION: You should resume your pre-surgery medications unless told not to. In addition you will often be given an antibiotic to prevent infection. These should be taken as prescribed until the bottles are finished unless   you are having an unusual reaction to one of the drugs.  PROBLEMS YOU SHOULD REPORT TO US: Fevers over 100.5 Fahrenheit. Heavy bleeding, or clots ( See above notes about blood in urine ). Inability to urinate. Drug reactions ( hives, rash, nausea, vomiting, diarrhea ). Severe burning or pain with urination that is not improving.  FOLLOW-UP: You will  need a follow-up appointment to monitor your progress. Call for this appointment at the number listed above. Usually the first appointment will be about three to fourteen days after your surgery.      

## 2013-10-07 NOTE — Anesthesia Postprocedure Evaluation (Signed)
  Anesthesia Post-op Note  Patient: Theresa Harrington  Procedure(s) Performed: Procedure(s) (LRB): CYSTOSCOPY WITH RETROGRADE PYELOGRAM, right URETEROSCOPY AND STENT PLACEMENT, stone extraction (Right) HOLMIUM LASER APPLICATION (Right)  Patient Location: PACU  Anesthesia Type: General  Level of Consciousness: awake and alert   Airway and Oxygen Therapy: Patient Spontanous Breathing  Post-op Pain: mild  Post-op Assessment: Post-op Vital signs reviewed, Patient's Cardiovascular Status Stable, Respiratory Function Stable, Patent Airway and No signs of Nausea or vomiting  Last Vitals:  Filed Vitals:   10/07/13 1215  BP: 121/53  Pulse: 60  Temp:   Resp: 12    Post-op Vital Signs: stable   Complications: No apparent anesthesia complications

## 2013-10-07 NOTE — Transfer of Care (Signed)
Immediate Anesthesia Transfer of Care Note  Patient: Theresa Harrington  Procedure(s) Performed: Procedure(s) (LRB): CYSTOSCOPY WITH RETROGRADE PYELOGRAM, right URETEROSCOPY AND STENT PLACEMENT, stone extraction (Right) HOLMIUM LASER APPLICATION (Right)  Patient Location: PACU  Anesthesia Type: General  Level of Consciousness: awake, oriented, sedated and patient cooperative  Airway & Oxygen Therapy: Patient Spontanous Breathing and Patient connected to face mask oxygen  Post-op Assessment: Report given to PACU RN and Post -op Vital signs reviewed and stable  Post vital signs: Reviewed and stable  Complications: No apparent anesthesia complications

## 2013-10-07 NOTE — Op Note (Signed)
Theresa Harrington is a 58 y.o.   10/07/2013  General  Preop diagnosis: Right proximal ureteral calculus.  Postop diagnosis: Same  Procedure done: Cystoscopy, right retrograde pyelogram, dilation of the intramural ureter, ureteroscopy, holmium laser right ureteral calculus, stone extraction, double-J stent insertion  Surgeon: Charlene Brooke. Theresa Harrington  Anesthesia: General  Indication: Patient is a 58 years old female with a right proximal ureteral calculus. She was treated with lithotripsy twice. The stone was fragmented but she did not pass the fragments. She is scheduled now for cystoscopy and stone extraction  Procedure: The patient was identified by her wrist band and proper timeout was taken.  Under general anesthesia she was prepped and draped and placed in the dorsolithotomy position. A panendoscope was inserted in the bladder. The bladder mucosa is normal. There is no stone or tumor in the bladder. The ureteral orifices are in normal position and shape.  Right retrograde pyelogram:  A cone-tip catheter was passed through the cystoscope and the right ureteral orifice. Contrast was injected through the cone-tip catheter. The contrast would not go up the ureter and refluxed in the bladder. The cone-tip catheter was removed. A Glidewire was passed through a #6 [place] open-ended catheter. The Glidewire and the open-ended catheter were passed through the cystoscope and the right ureteral orifice. The Glidewire was advanced up to the mid ureter and the open-ended catheter was advanced over the Glidewire into the mid ureter. The Glidewire was removed. Contrast was then injected through the open-ended catheter. The ureter appears normal. There is a filling defect in the proximal ureter at the L2-L3 level. A sensor wire was then passed through the open-ended catheter up to the renal pelvis. The open-ended catheter was removed. The intramural ureter was then dilated over the sensor wire with the inner sheath of a  ureteroscope access sheath. The ureteroscope access sheath was removed..  The sensor wire with the use as a safety wire. A #6.5 French semirigid ureteroscope was then passed in the bladder and through the right ureteral orifice. The ureteroscope was advanced without difficulty up to the proximal ureter where the stone was identified. The stone was impacted on the wall of the ureter. With a 365 microfiber holmium laser the stone was fragmented in multiple smaller stone fragments. Then the stone fragments were removed with a Nitinol basket. The fragments that were impacted into the wall of the ureter were also removed with the Nitinol basket. There was no evidence of remaining stone fragment in the ureter.  Second retrograde pyelogram:  Contrast was injected through the ureteroscope. There is no evidence of filling defect in the ureter. There there is no extravasation of contrast. The ureteroscope was then removed under direct vision.  The sensor wire was then backloaded into the cystoscope. A #6 French-24 double-J stent was passed over the sensor wire. The proximal curl of the double-J stent is in the renal pelvis. The distal curl is in the bladder. No string was left attached to the double-J stent. The sensor wire was removed. Several stone fragments that were dropped in the bladder were removed with a Toomey syringe. The bladder was then emptied and the cystoscope removed.  The patient tolerated the procedure well and left the or in satisfactory condition to postanesthesia care unit

## 2013-10-07 NOTE — Anesthesia Procedure Notes (Signed)
Procedure Name: LMA Insertion Date/Time: 10/07/2013 10:31 AM Performed by: Denna Haggard D Pre-anesthesia Checklist: Patient identified, Emergency Drugs available, Suction available and Patient being monitored Patient Re-evaluated:Patient Re-evaluated prior to inductionOxygen Delivery Method: Circle System Utilized Preoxygenation: Pre-oxygenation with 100% oxygen Intubation Type: IV induction Ventilation: Mask ventilation without difficulty LMA: LMA inserted LMA Size: 4.0 Number of attempts: 1 Airway Equipment and Method: bite block Placement Confirmation: positive ETCO2 Tube secured with: Tape Dental Injury: Teeth and Oropharynx as per pre-operative assessment

## 2013-10-07 NOTE — Anesthesia Preprocedure Evaluation (Addendum)
Anesthesia Evaluation  Patient identified by MRN, date of birth, ID band Patient awake    Reviewed: Allergy & Precautions, H&P , NPO status , Patient's Chart, lab work & pertinent test results  History of Anesthesia Complications (+) PONV  Airway Mallampati: II TM Distance: >3 FB Neck ROM: Full    Dental no notable dental hx.    Pulmonary former smoker,  breath sounds clear to auscultation  Pulmonary exam normal       Cardiovascular hypertension, Pt. on medications Rhythm:Regular Rate:Normal     Neuro/Psych negative neurological ROS  negative psych ROS   GI/Hepatic Neg liver ROS, GERD-  Medicated and Controlled,  Endo/Other  diabetes, Type 2, Oral Hypoglycemic Agents  Renal/GU negative Renal ROS  negative genitourinary   Musculoskeletal negative musculoskeletal ROS (+)   Abdominal   Peds negative pediatric ROS (+)  Hematology negative hematology ROS (+)   Anesthesia Other Findings   Reproductive/Obstetrics negative OB ROS                          Anesthesia Physical Anesthesia Plan  ASA: III  Anesthesia Plan: General   Post-op Pain Management:    Induction: Intravenous  Airway Management Planned: LMA  Additional Equipment:   Intra-op Plan:   Post-operative Plan: Extubation in OR  Informed Consent: I have reviewed the patients History and Physical, chart, labs and discussed the procedure including the risks, benefits and alternatives for the proposed anesthesia with the patient or authorized representative who has indicated his/her understanding and acceptance.   Dental advisory given  Plan Discussed with: CRNA  Anesthesia Plan Comments:         Anesthesia Quick Evaluation

## 2013-10-07 NOTE — H&P (Signed)
History of Present Illness Ms Tyminski returns for follow-up. She has not passed a stone. She has not had any pain. She had ESL of the proximal ureteral calculus twice. KUB 4 weeks ago showed the stone fragments in the same location. Since she has not passed stone I believe she needs stone manipulation.   Past Medical History Problems  1. History of diabetes mellitus (V12.29) 2. History of hypercholesterolemia (V12.29) 3. History of hypertension (V12.59) 4. History of Microscopic hematuria (599.72)  Surgical History Problems  1. History of Abdominoplasty 2. History of Breast Surgery 3. History of Cystoscopy With Insertion Of Ureteral Stent Right 4. History of Laparosc Restrictive Proc Adjustable Gastric Band Placement 5. History of Lithotripsy 6. History of Lithotripsy 7. History of Lithotripsy  Current Meds 1. Atorvastatin Calcium TABS;  Therapy: (Recorded:16Feb2015) to Recorded 2. GlyBURIDE 5 MG Oral Tablet;  Therapy: (Recorded:20Aug2012) to Recorded 3. Invokana 100 MG Oral Tablet;  Therapy: (Recorded:16Feb2015) to Recorded 4. Levaquin 500 MG Oral Tablet (Levofloxacin);  Therapy: (Recorded:16Feb2015) to Recorded 5. Losartan Potassium TABS;  Therapy: (Recorded:16Feb2015) to Recorded 6. MetFORMIN HCl - 1000 MG Oral Tablet;  Therapy: (Recorded:20Aug2012) to Recorded 7. Oxybutynin Chloride 5 MG Oral Tablet;  Therapy: (Recorded:16Feb2015) to Recorded 8. Pantoprazole Sodium 40 MG SOLR;  Therapy: (Recorded:16Feb2015) to Recorded  Allergies Medication  1. Codeine Derivatives  Family History Problems  1. Family history of Father Deceased At Age ____   50 2. Family history of Mother Deceased At Age 7 3. Family history of Stroke Syndrome (V17.1) : Brother  Social History Problems  1. Denied: History of Alcohol Use 2. Caffeine Use   1 large cup coffee 3. Former smoker (V15.82) 4. Marital History - Single 5. Tobacco use (305.1)   smoked 1 pk for 20 yrs / quit 10 yrs  ago  Review of Systems Genitourinary, constitutional, skin, eye, otolaryngeal, hematologic/lymphatic, cardiovascular, pulmonary, endocrine, musculoskeletal, gastrointestinal, neurological and psychiatric system(s) were reviewed and pertinent findings if present are noted.    Physical Exam Constitutional: Well nourished and well developed . No acute distress.  ENT:. The ears and nose are normal in appearance.  Neck: The appearance of the neck is normal and no neck mass is present.  Pulmonary: No respiratory distress and normal respiratory rhythm and effort.  Cardiovascular: Heart rate and rhythm are normal . No peripheral edema.  Abdomen: The abdomen is soft and nontender. No masses are palpated. No CVA tenderness. No hernias are palpable. No hepatosplenomegaly noted.  Genitourinary:  The bladder is non tender and not distended.  Lymphatics: The femoral and inguinal nodes are not enlarged or tender.  Skin: Normal skin turgor, no visible rash and no visible skin lesions.  Neuro/Psych:. Mood and affect are appropriate.    Results/Data Urine [Data Includes: Last 1 Day]   [id number]  COLOR YELLOW   APPEARANCE CLEAR   SPECIFIC GRAVITY 1.020   pH 5.5   GLUCOSE > 1000 mg/dL  BILIRUBIN NEG   KETONE NEG mg/dL  BLOOD TRACE   PROTEIN NEG mg/dL  UROBILINOGEN 0.2 mg/dL  NITRITE NEG   LEUKOCYTE ESTERASE NEG   SQUAMOUS EPITHELIAL/HPF FEW   WBC 7-10 WBC/hpf  RBC 0-2 RBC/hpf  BACTERIA RARE   CRYSTALS Calcium Oxalate crystals noted   CASTS NONE SEEN    Assessment Assessed  1. Calculus of right ureter (592.1)  Plan Calculus of right ureter  1. Follow-up Schedule Surgery Office  Follow-up  Status: Complete  Done: [id number] Health Maintenance  2. UA With REFLEX; [Do Not Release]; Status:Resulted -  Requires Verification;   Done:  [id number] 01:28PM  Cystoscopy, ureteroscopy, stone manipulation. Will get KUB before the procedure. The risks, benefits were discussed. The risks include but are  not limited to hemorrhage, infection, inability to find the stone and extract it, ureteral injury. She understands and wishes to proceed.

## 2013-10-08 ENCOUNTER — Encounter (HOSPITAL_BASED_OUTPATIENT_CLINIC_OR_DEPARTMENT_OTHER): Payer: Self-pay | Admitting: Urology

## 2013-10-09 ENCOUNTER — Ambulatory Visit (INDEPENDENT_AMBULATORY_CARE_PROVIDER_SITE_OTHER): Payer: 59 | Admitting: Surgery

## 2013-10-09 VITALS — BP 113/66 | HR 62 | Temp 98.0°F | Resp 18 | Ht 65.0 in | Wt 176.0 lb

## 2013-10-09 DIAGNOSIS — Z9884 Bariatric surgery status: Secondary | ICD-10-CM

## 2013-10-09 NOTE — Progress Notes (Signed)
[place], MD,  [address]  [place], [place]    [place] Phone:  [phone] FAX:  [phone]   Re:   Theresa Harrington DOB:   10-Mar-1956 MRN:   [id number]  ASSESSMENT AND PLAN: 1.  History of lap band, 10 mm - 05/29/2005 - D. Hamlet Lasecki  I revised lap band port - 04/08/2010.  She had 2.2 cc, I added 0.4 cc for 2.6 cc total.  She will see me in Jan 2016 for her anniversary follow up.  2.  HTN 3.  History of kidney stones - M. Nesi did a procedure on her yesterday 4.  DM 5.  GERD 6.  Asymptomatic cholelithiasis  HISTORY OF PRESENT ILLNESS: Chief Complaint  Patient presents with  . Lap Band Fill    fill    Theresa Harrington is a 58 y.o. (DOB: 1956-04-27)  AA  female who is a patient of Scarlette Calico, MD and comes to me today for follow up of a lap band. I last saw Ms. Standiford in May 2012. Having to take care of her brother who has bone cancer.   He has multiple medical problems. Does not feel much resistance with the LAP-BAND. She's had no nausea or vomiting and no night cough. She's only gained 3 pounds in 3 years, which I think is good. She, however, think she needs a fill.   Past Medical History  Diagnosis Date  . Hyperlipidemia   . Hypertension   . PONV (postoperative nausea and vomiting)   . Right ureteral stone   . History of kidney stones   . Type 2 diabetes mellitus   . PAD (peripheral artery disease)   . Atherosclerosis of aorta   . GERD (gastroesophageal reflux disease)   . Asymptomatic cholelithiasis   . Peripheral arterial occlusive disease     lower extremities  . Wears glasses   . Hypothyroidism     no meds    Current Outpatient Prescriptions  Medication Sig Dispense Refill  . amLODipine-olmesartan (AZOR) 5-40 MG per tablet Take 1 tablet by mouth every morning.      [name] atorvastatin (LIPITOR) 80 MG tablet Take 80 mg by mouth every evening.       . Canagliflozin (INVOKANA) 100 MG TABS Take 100 mg by mouth  daily.      . cilostazol (PLETAL) 100 MG tablet Take 1 tablet (100 mg total) by mouth daily before breakfast.  30 tablet  11  . glyBURIDE (DIABETA) 5 MG tablet Take 1 tablet (5 mg total) by mouth daily.  90 tablet  0  . loratadine (CLARITIN) 10 MG tablet Take 10 mg by mouth daily as needed for allergies.      . metFORMIN (GLUCOPHAGE) 1000 MG tablet Take 1,000 mg by mouth 2 (two) times daily.      . pantoprazole (PROTONIX) 40 MG tablet Take 40 mg by mouth every morning.       [name] HYDROmorphone (DILAUDID) 2 MG tablet Take 1 tablet (2 mg total) by mouth every 4 (four) hours as needed for severe pain.  30 tablet  0   No current facility-administered medications for this visit.   Past Medical History GI:  She had an abdominoplasty May 2009.  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  Port easy to feel.  Procedure:  While in the office, I accessed her lap band with a huber needle.  She had 2.2 cc of fluid in the lap band, I added 0.4 cc.  DATA REVIEWED: Epic notes.  Alphonsa Overall, MD,  [facility], [address]  [place], [place]    [place] Phone:  [phone] FAX:  [phone]

## 2013-10-11 ENCOUNTER — Ambulatory Visit (INDEPENDENT_AMBULATORY_CARE_PROVIDER_SITE_OTHER): Payer: 59 | Admitting: Family Medicine

## 2013-10-11 ENCOUNTER — Encounter: Payer: Self-pay | Admitting: Family Medicine

## 2013-10-11 VITALS — BP 130/70 | HR 58 | Temp 98.1°F | Resp 18 | Ht 65.0 in | Wt 167.0 lb

## 2013-10-11 DIAGNOSIS — J309 Allergic rhinitis, unspecified: Secondary | ICD-10-CM

## 2013-10-11 MED ORDER — HYDROCOD POLST-CHLORPHEN POLST 10-8 MG/5ML PO LQCR: 5.0000 mL | Freq: Two times a day (BID) | ORAL | Status: DC | PRN

## 2013-10-11 NOTE — Patient Instructions (Signed)
Follow up as needed Start taking the Claritin daily for the allergy component Drink plenty of fluids Tussionex as needed at night Mucinex DM for daytime cough REST! Call with any questions or concerns Hang in there!

## 2013-10-11 NOTE — Progress Notes (Signed)
Pre-visit discussion using our clinic review tool. No additional management support is needed unless otherwise documented below in the visit note.  

## 2013-10-11 NOTE — Progress Notes (Signed)
   Subjective:    Patient ID: Theresa Harrington, female    DOB: [birth year], 58 y.o.   MRN: [id number]  HPI Cough- sxs started ~1 week ago.  Worse at night.  Discussed w/ PCP previously and was told she 'might have a touch of asthma'.  Was given inhaler and cough syrup w/ some relief.  Cough is only occuring at night- productive of clear sputum.  Took Claritin last night but not taking regularly.  No facial pain/pressure.  Denies nasal congestion.  No ear pain.  No fevers.   Review of Systems For ROS see HPI     Objective:   Physical Exam  Vitals reviewed. Constitutional: She appears well-developed and well-nourished. No distress.  HENT:  Head: Normocephalic and atraumatic.  Right Ear: Tympanic membrane normal.  Left Ear: Tympanic membrane normal.  Nose: Mucosal edema and rhinorrhea present. Right sinus exhibits no maxillary sinus tenderness and no frontal sinus tenderness. Left sinus exhibits no maxillary sinus tenderness and no frontal sinus tenderness.  Mouth/Throat: Mucous membranes are normal. Posterior oropharyngeal erythema (w/ PND) present.  Eyes: Conjunctivae and EOM are normal. Pupils are equal, round, and reactive to light.  Neck: Normal range of motion. Neck supple.  Cardiovascular: Normal rate, regular rhythm and normal heart sounds.   Pulmonary/Chest: Effort normal and breath sounds normal. No respiratory distress. She has no wheezes. She has no rales.  Lymphadenopathy:    She has no cervical adenopathy.          Assessment & Plan:

## 2013-10-11 NOTE — Assessment & Plan Note (Signed)
New to provider.  Pt's PE w/o evidence of infxn.  Given that symptoms only seem to be occuring at night, suspect this is all allergy/PND related.  Start OTC antihistamine.  Pt specifically requesting Tussionex- script given, no refills.  Reviewed supportive care and red flags that should prompt return. Pt expressed understanding and is in agreement w/ plan.

## 2013-10-21 ENCOUNTER — Other Ambulatory Visit: Payer: Self-pay | Admitting: Internal Medicine

## 2013-10-22 ENCOUNTER — Ambulatory Visit: Payer: Self-pay | Admitting: Cardiology

## 2013-10-22 ENCOUNTER — Telehealth: Payer: Self-pay | Admitting: *Deleted

## 2013-10-22 NOTE — Telephone Encounter (Signed)
Left msg on triage stating she was need a referral to go to foot center. Spoke with [facility]ney last week but haven't received call back. Called pt back to get more information no answer LMOM RTC...Johny Chess

## 2013-10-27 ENCOUNTER — Ambulatory Visit (INDEPENDENT_AMBULATORY_CARE_PROVIDER_SITE_OTHER): Payer: 59 | Admitting: Cardiology

## 2013-10-27 ENCOUNTER — Encounter: Payer: Self-pay | Admitting: Cardiology

## 2013-10-27 VITALS — BP 122/62 | HR 60 | Ht 65.0 in | Wt 167.8 lb

## 2013-10-27 DIAGNOSIS — I10 Essential (primary) hypertension: Secondary | ICD-10-CM

## 2013-10-27 DIAGNOSIS — I491 Atrial premature depolarization: Secondary | ICD-10-CM | POA: Insufficient documentation

## 2013-10-27 NOTE — Patient Instructions (Signed)
Your physician recommends that you continue on your current medications as directed. Please refer to the Current Medication list given to you today.  Your physician recommends that you schedule a follow-up appointment in: As Needed

## 2013-10-27 NOTE — Telephone Encounter (Signed)
No return call back closing encounter...Theresa Harrington

## 2013-10-27 NOTE — Progress Notes (Signed)
Patient ID: Theresa Harrington, female   DOB: 1955/10/06, 58 y.o.   MRN: [id number]  [address] [place], [place]  [address] Phone: [phone] Fax:  [phone]  Date:  10/27/2013   ID:  Theresa Harrington, DOB 04-03-1956, MRN [id number]  PCP:  Scarlette Calico, MD  Cardiologist:  Fransico Him, MD    History of Present Illness: Theresa Harrington is a 58 y.o. female presenting for new patient evaluation of PACs.  Patient seen at PCP office 09/03/13 for HTN. In the office note she was noted on ROS to have shortness of breath and dyspnea on exertion. She did not have complaint of chest pain at that time. She had an EKG at that time that revealed frequent PACs and tachycardia. She was referred to cardiology for evaluation of signs and symptoms of CHF as PCP noted S3 and gallop on auscultation of the heart.  Patient states EKG obtained due to pulse abnormality felt on vital signs work-up. She states she did not have any shortness of breath at that time or any symptoms of any kind.   Currently she notes no chest pain, no shortness of breath, no dyspnea on exertion, no dizziness, no light headedness, no syncope.   History of no cardiac issues. History of PAD, HTN, HLD, and DM. States took self off of lipitor due to myalgias. Now don't hurt as much. Has appointment with PCP tomorrow.   Family history of CHF in dad and brother. No history of MI.  No alcohol use. Notes tobacco use, quit in 2002. Smoked 1 ppd since 58 yo. No illicit drug use.   Wt Readings from Last 3 Encounters:  10/11/13 167 lb (75.751 kg)  10/09/13 176 lb (79.833 kg)  10/07/13 174 lb (78.926 kg)     Past Medical History  Diagnosis Date  . Hyperlipidemia   . Hypertension   . PONV (postoperative nausea and vomiting)   . Right ureteral stone   . History of kidney stones   . Type 2 diabetes mellitus   . PAD (peripheral artery disease)   . Atherosclerosis of aorta   . GERD (gastroesophageal reflux disease)   . Asymptomatic  cholelithiasis   . Peripheral arterial occlusive disease     lower extremities  . Wears glasses   . Hypothyroidism     no meds    Current Outpatient Prescriptions  Medication Sig Dispense Refill  . amLODipine-olmesartan (AZOR) 5-40 MG per tablet Take 1 tablet by mouth every morning.      [name] atorvastatin (LIPITOR) 80 MG tablet Take 80 mg by mouth every evening.       . Canagliflozin (INVOKANA) 100 MG TABS Take 100 mg by mouth daily.      . chlorpheniramine-HYDROcodone (TUSSIONEX PENNKINETIC ER) 10-8 MG/5ML LQCR Take 5 mLs by mouth every 12 (twelve) hours as needed for cough.  115 mL  0  . cilostazol (PLETAL) 100 MG tablet Take 1 tablet (100 mg total) by mouth daily before breakfast.  30 tablet  11  . glyBURIDE (DIABETA) 5 MG tablet TAKE ONE TABLET BY MOUTH ONCE DAILY  90 tablet  0  . HYDROmorphone (DILAUDID) 2 MG tablet Take 1 tablet (2 mg total) by mouth every 4 (four) hours as needed for severe pain.  30 tablet  0  . loratadine (CLARITIN) 10 MG tablet Take 10 mg by mouth daily as needed for allergies.      . metFORMIN (GLUCOPHAGE) 1000 MG tablet Take 1,000 mg by mouth 2 (  two) times daily.      . pantoprazole (PROTONIX) 40 MG tablet Take 40 mg by mouth every morning.        No current facility-administered medications for this visit.    Allergies:    Allergies  Allergen Reactions  . Ampicillin Nausea And Vomiting  . Codeine Nausea And Vomiting  . Lisinopril Other (See Comments)     cough  . Penicillins Nausea And Vomiting  . Januvia [Sitagliptin]     nausea    Social History:  The patient  reports that she quit smoking about 12 years ago. Her smoking use included Cigarettes. She has a 28 pack-year smoking history. She has never used smokeless tobacco. She reports that she does not drink alcohol or use illicit drugs.   Family History:  The patient's family history includes Arthritis in her other; Cancer in her brother and other; Colon cancer in her maternal aunt; Deep vein  thrombosis in her father and sister; Diabetes in her brother, brother, brother, father, and sister; Heart disease in her brother, father, and sister; Hyperlipidemia in her brother, father, mother, and sister; Hypertension in her other; Kidney disease in her mother; Other in her mother; Stroke in her other.   ROS:  Please see the history of present illness.    All other systems reviewed and negative.   PHYSICAL EXAM: VS:  BP 122/62  Pulse 60  Ht 5\' 5"  (1.651 m)  Wt 167 lb 12.8 oz (76.114 kg)  BMI 27.92 kg/m2 Well nourished, well developed, in no acute distress HEENT: normal Neck: no JVD Cardiac:  normal S1, S2; RRR; no murmur Lungs:  clear to auscultation bilaterally, no wheezing, rhonchi or rales Abd: soft, nontender, no hepatomegaly Ext: no edema Skin: warm and dry Neuro:  CNs 2-12 intact, no focal abnormalities noted  EKG:  Bradycardia, ectopic atrial rhythm, HR 55     ASSESSMENT AND PLAN:  1. PACs: patient with recent EKG with PACs. Is asymptomatic at this time and has never had any symptoms. EKG from today revealed ectopic atrial bradycardia. This is a benign occurrence and will continue to monitor this at this time. Will plan for prn follow-up at this time.  Signed, Tommi Rumps, MD 10/27/2013 10:58 AM   Patient seen and examined.  Chart reviewed.  Patient sent for cardiac evaluation due to abnormal EKG with PAC's with aberration.  She is asymptomatic.  She has never had any SOB or chest pain.  Denies palpitations or dizziness.  No further workup necessary at this time.

## 2013-10-28 ENCOUNTER — Encounter: Payer: Self-pay | Admitting: Internal Medicine

## 2013-10-28 ENCOUNTER — Ambulatory Visit (INDEPENDENT_AMBULATORY_CARE_PROVIDER_SITE_OTHER): Payer: 59 | Admitting: Internal Medicine

## 2013-10-28 ENCOUNTER — Other Ambulatory Visit (INDEPENDENT_AMBULATORY_CARE_PROVIDER_SITE_OTHER): Payer: 59

## 2013-10-28 VITALS — BP 136/70 | HR 60 | Temp 98.6°F | Resp 16 | Ht 65.0 in | Wt 170.0 lb

## 2013-10-28 DIAGNOSIS — I10 Essential (primary) hypertension: Secondary | ICD-10-CM

## 2013-10-28 DIAGNOSIS — E1159 Type 2 diabetes mellitus with other circulatory complications: Secondary | ICD-10-CM

## 2013-10-28 DIAGNOSIS — D6489 Other specified anemias: Secondary | ICD-10-CM

## 2013-10-28 DIAGNOSIS — E785 Hyperlipidemia, unspecified: Secondary | ICD-10-CM

## 2013-10-28 DIAGNOSIS — E042 Nontoxic multinodular goiter: Secondary | ICD-10-CM

## 2013-10-28 DIAGNOSIS — D539 Nutritional anemia, unspecified: Secondary | ICD-10-CM | POA: Insufficient documentation

## 2013-10-28 DIAGNOSIS — E538 Deficiency of other specified B group vitamins: Secondary | ICD-10-CM

## 2013-10-28 DIAGNOSIS — D509 Iron deficiency anemia, unspecified: Secondary | ICD-10-CM | POA: Insufficient documentation

## 2013-10-28 DIAGNOSIS — G63 Polyneuropathy in diseases classified elsewhere: Secondary | ICD-10-CM

## 2013-10-28 LAB — COMPREHENSIVE METABOLIC PANEL
ALBUMIN: 3.8 g/dL (ref 3.5–5.2)
ALT: 11 U/L (ref 0–35)
AST: 18 U/L (ref 0–37)
Alkaline Phosphatase: 58 U/L (ref 39–117)
BUN: 12 mg/dL (ref 6–23)
CO2: 24 meq/L (ref 19–32)
Calcium: 9.5 mg/dL (ref 8.4–10.5)
Chloride: 108 mEq/L (ref 96–112)
Creatinine, Ser: 0.6 mg/dL (ref 0.4–1.2)
GFR: 107.57 mL/min (ref >60.00)
GLUCOSE: 115 mg/dL — AB (ref 70–99)
POTASSIUM: 3.9 meq/L (ref 3.5–5.1)
Sodium: 141 mEq/L (ref 135–145)
Total Bilirubin: 0.4 mg/dL (ref 0.2–1.2)
Total Protein: 7 g/dL (ref 6.0–8.3)

## 2013-10-28 LAB — IBC PANEL
Iron: 30 ug/dL — ABNORMAL LOW (ref 42–145)
Saturation Ratios: 10.2 % — ABNORMAL LOW (ref 20.0–50.0)
Transferrin: 210.2 mg/dL — ABNORMAL LOW (ref 212.0–360.0)

## 2013-10-28 LAB — CBC WITH DIFFERENTIAL/PLATELET
BASOS ABS: 0 10*3/uL (ref 0.0–0.1)
Basophils Relative: 0.4 % (ref 0.0–3.0)
Eosinophils Absolute: 0.2 10*3/uL (ref 0.0–0.7)
Eosinophils Relative: 1.9 % (ref 0.0–5.0)
HCT: 35.5 % — ABNORMAL LOW (ref 36.0–46.0)
Hemoglobin: 11.7 g/dL — ABNORMAL LOW (ref 12.0–15.0)
LYMPHS PCT: 31.4 % (ref 12.0–46.0)
Lymphs Abs: 2.7 10*3/uL (ref 0.7–4.0)
MCHC: 32.9 g/dL (ref 30.0–36.0)
MCV: 85 fl (ref 78.0–100.0)
Monocytes Absolute: 0.5 10*3/uL (ref 0.1–1.0)
Monocytes Relative: 6.2 % (ref 3.0–12.0)
Neutro Abs: 5.1 10*3/uL (ref 1.4–7.7)
Neutrophils Relative %: 60.1 % (ref 43.0–77.0)
Platelets: 324 10*3/uL (ref 150.0–400.0)
RBC: 4.17 Mil/uL (ref 3.87–5.11)
RDW: 15.4 % (ref 11.5–15.5)
WBC: 8.5 10*3/uL (ref 4.0–10.5)

## 2013-10-28 LAB — CK: Total CK: 48 U/L (ref 7–177)

## 2013-10-28 LAB — LIPID PANEL
CHOLESTEROL: 197 mg/dL (ref 0–200)
HDL: 57.4 mg/dL (ref >39.00)
LDL CALC: 114 mg/dL — AB (ref 0–99)
NonHDL: 139.6
TRIGLYCERIDES: 129 mg/dL (ref 0.0–149.0)
Total CHOL/HDL Ratio: 3
VLDL: 25.8 mg/dL (ref 0.0–40.0)

## 2013-10-28 LAB — VITAMIN B12: Vitamin B-12: 212 pg/mL (ref 211–911)

## 2013-10-28 LAB — HEMOGLOBIN A1C: Hgb A1c MFr Bld: 8.5 % — ABNORMAL HIGH (ref 4.6–6.5)

## 2013-10-28 LAB — TSH: TSH: 0.09 u[IU]/mL — AB (ref 0.35–4.50)

## 2013-10-28 LAB — FOLATE: FOLATE: 16.3 ng/mL (ref >5.9)

## 2013-10-28 LAB — FERRITIN: Ferritin: 10.4 ng/mL (ref 10.0–291.0)

## 2013-10-28 MED ORDER — FERRALET 90 90-1 MG PO TABS: 1.0000 | ORAL_TABLET | Freq: Every day | ORAL | Status: DC

## 2013-10-28 MED ORDER — INSULIN DETEMIR 100 UNIT/ML ~~LOC~~ SOLN: 30.0000 [IU] | Freq: Every day | SUBCUTANEOUS | Status: DC

## 2013-10-28 MED ORDER — CANAGLIFLOZIN 300 MG PO TABS: 1.0000 | ORAL_TABLET | Freq: Every morning | ORAL | Status: DC

## 2013-10-28 NOTE — Assessment & Plan Note (Signed)
Her BP is well controlled Her lytes and renal function are stable 

## 2013-10-28 NOTE — Assessment & Plan Note (Addendum)
Her blood sugars are too high I have asked her to add insulin to her regimen and will increase the dose of invokana

## 2013-10-28 NOTE — Assessment & Plan Note (Signed)
Her CPK is normal but she tells me that lipitor caused pain so it has been stopped Will consider a different statin in the future

## 2013-10-28 NOTE — Assessment & Plan Note (Signed)
Start ferralet 

## 2013-10-28 NOTE — Patient Instructions (Signed)
Type 2 Diabetes Mellitus, Adult Type 2 diabetes mellitus, often simply referred to as type 2 diabetes, is a long-lasting (chronic) disease. In type 2 diabetes, the pancreas does not make enough insulin (a hormone), the cells are less responsive to the insulin that is made (insulin resistance), or both. Normally, insulin moves sugars from food into the tissue cells. The tissue cells use the sugars for energy. The lack of insulin or the lack of normal response to insulin causes excess sugars to build up in the blood instead of going into the tissue cells. As a result, high blood sugar (hyperglycemia) develops. The effect of high sugar (glucose) levels can cause many complications. Type 2 diabetes was also previously called adult-onset diabetes but it can occur at any age.  RISK FACTORS  A person is predisposed to developing type 2 diabetes if someone in the family has the disease and also has one or more of the following primary risk factors:  Overweight.  An inactive lifestyle.  A history of consistently eating high-calorie foods. Maintaining a normal weight and regular physical activity can reduce the chance of developing type 2 diabetes. SYMPTOMS  A person with type 2 diabetes may not show symptoms initially. The symptoms of type 2 diabetes appear slowly. The symptoms include:  Increased thirst (polydipsia).  Increased urination (polyuria).  Increased urination during the night (nocturia).  Weight loss. This weight loss may be rapid.  Frequent, recurring infections.  Tiredness (fatigue).  Weakness.  Vision changes, such as blurred vision.  Fruity smell to your breath.  Abdominal pain.  Nausea or vomiting.  Cuts or bruises which are slow to heal.  Tingling or numbness in the hands or feet. DIAGNOSIS Type 2 diabetes is frequently not diagnosed until complications of diabetes are present. Type 2 diabetes is diagnosed when symptoms or complications are present and when blood  glucose levels are increased. Your blood glucose level may be checked by one or more of the following blood tests:  A fasting blood glucose test. You will not be allowed to eat for at least 8 hours before a blood sample is taken.  A random blood glucose test. Your blood glucose is checked at any time of the day regardless of when you ate.  A hemoglobin A1c blood glucose test. A hemoglobin A1c test provides information about blood glucose control over the previous 3 months.  An oral glucose tolerance test (OGTT). Your blood glucose is measured after you have not eaten (fasted) for 2 hours and then after you drink a glucose-containing beverage. TREATMENT   You may need to take insulin or diabetes medicine daily to keep blood glucose levels in the desired range.  If you use insulin, you may need to adjust the dosage depending on the carbohydrates that you eat with each meal or snack. The treatment goal is to maintain the before meal blood sugar (preprandial glucose) level at 70-130 mg/dL. HOME CARE INSTRUCTIONS   Have your hemoglobin A1c level checked twice a year.  Perform daily blood glucose monitoring as directed by your health care provider.  Monitor urine ketones when you are ill and as directed by your health care provider.  Take your diabetes medicine or insulin as directed by your health care provider to maintain your blood glucose levels in the desired range.  Never run out of diabetes medicine or insulin. It is needed every day.  If you are using insulin, you may need to adjust the amount of insulin given based on your intake   of carbohydrates. Carbohydrates can raise blood glucose levels but need to be included in your diet. Carbohydrates provide vitamins, minerals, and fiber which are an essential part of a healthy diet. Carbohydrates are found in fruits, vegetables, whole grains, dairy products, legumes, and foods containing added sugars.  Eat healthy foods. You should make an  appointment to see a registered dietitian to help you create an eating plan that is right for you.  Lose weight if overweight.  Carry a medical alert card or wear your medical alert jewelry.  Carry a 15 gram carbohydrate snack with you at all times to treat low blood glucose (hypoglycemia). Some examples of 15 gram carbohydrate snacks include:  Glucose tablets, 3 or 4  Raisins, 2 tablespoons (24 grams)  Jelly beans, 6  Animal crackers, 8  Regular pop, 4 ounces (120 mL)  Gummy treats, 9  Recognize hypoglycemia. Hypoglycemia occurs with blood glucose levels of 70 mg/dL and below. The risk for hypoglycemia increases when fasting or skipping meals, during or after intense exercise, and during sleep. Hypoglycemia symptoms can include:  Tremors or shakes.  Decreased ability to concentrate.  Sweating.  Increased heart rate.  Headache.  Dry mouth.  Hunger.  Irritability.  Anxiety.  Restless sleep.  Altered speech or coordination.  Confusion.  Treat hypoglycemia promptly. If you are alert and able to safely swallow, follow the 15:15 rule:  Take 15-20 grams of rapid-acting glucose or carbohydrate. Rapid-acting options include glucose gel, glucose tablets, or 4 ounces (120 mL) of fruit juice, regular soda, or low fat milk.  Check your blood glucose level 15 minutes after taking the glucose.  Take 15-20 grams more of glucose if the repeat blood glucose level is still 70 mg/dL or below.  Eat a meal or snack within 1 hour once blood glucose levels return to normal.  Be alert to feeling very thirsty and urinating more frequently than usual, which are early signs of hyperglycemia. An early awareness of hyperglycemia allows for prompt treatment. Treat hyperglycemia as directed by your health care provider.  Engage in at least 150 minutes of moderate-intensity physical activity a week, spread over at least 3 days of the week or as directed by your health care provider. In  addition, you should engage in resistance exercise at least 2 times a week or as directed by your health care provider.  Adjust your medicine and food intake as needed if you start a new exercise or sport.  Follow your sick day plan at any time you are unable to eat or drink as usual.  Avoid tobacco use.  Limit alcohol intake to no more than 1 drink per day for nonpregnant women and 2 drinks per day for men. You should drink alcohol only when you are also eating food. Talk with your health care provider whether alcohol is safe for you. Tell your health care provider if you drink alcohol several times a week.  Follow up with your health care provider regularly.  Schedule an eye exam soon after the diagnosis of type 2 diabetes and then annually.  Perform daily skin and foot care. Examine your skin and feet daily for cuts, bruises, redness, nail problems, bleeding, blisters, or sores. A foot exam by a health care provider should be done annually.  Brush your teeth and gums at least twice a day and floss at least once a day. Follow up with your dentist regularly.  Share your diabetes management plan with your workplace or school.  Stay up-to-date with   immunizations.  Learn to manage stress.  Obtain ongoing diabetes education and support as needed.  Participate in, or seek rehabilitation as needed to maintain or improve independence and quality of life. Request a physical or occupational therapy referral if you are having foot or hand numbness or difficulties with grooming, dressing, eating, or physical activity. SEEK MEDICAL CARE IF:   You are unable to eat food or drink fluids for more than 6 hours.  You have nausea and vomiting for more than 6 hours.  Your blood glucose level is over 240 mg/dL.  There is a change in mental status.  You develop an additional serious illness.  You have diarrhea for more than 6 hours.  You have been sick or have had a fever for a couple of days  and are not getting better.  You have pain during any physical activity.  SEEK IMMEDIATE MEDICAL CARE IF:  You have difficulty breathing.  You have moderate to large ketone levels. MAKE SURE YOU:  Understand these instructions.  Will watch your condition.  Will get help right away if you are not doing well or get worse. Document Released: 04/17/2005 Document Revised: 04/22/2013 Document Reviewed: 11/14/2011 [employer] Patient Information 2015 [employer]. This information is not intended to replace advice given to you by your health care provider. Make sure you discuss any questions you have with your health care provider.  

## 2013-10-28 NOTE — Progress Notes (Signed)
   Subjective:    Patient ID: Theresa Harrington, female    DOB: 1955/12/09, 58 y.o.   MRN: [id number]  Anemia Presents for follow-up visit. Symptoms include malaise/fatigue and paresthesias (some tingling). There has been no abdominal pain, anorexia, bruising/bleeding easily, confusion, fever, leg swelling, light-headedness, pallor, palpitations, pica or weight loss. Signs of blood loss that are not present include hematemesis, hematochezia, melena and vaginal bleeding. Past treatments include nothing. (Gastric bypass)      Review of Systems  Constitutional: Positive for malaise/fatigue and fatigue. Negative for fever, chills, weight loss, diaphoresis and appetite change.  HENT: Negative.   Eyes: Negative.   Respiratory: Negative.  Negative for apnea, cough, choking, chest tightness, shortness of breath, wheezing and stridor.   Cardiovascular: Negative.  Negative for chest pain, palpitations and leg swelling.  Gastrointestinal: Negative.  Negative for nausea, vomiting, abdominal pain, diarrhea, constipation, blood in stool, melena, hematochezia, anorexia and hematemesis.  Endocrine: Negative.   Genitourinary: Negative.  Negative for vaginal bleeding.  Musculoskeletal: Positive for arthralgias and myalgias. Negative for back pain, gait problem, joint swelling, neck pain and neck stiffness.  Skin: Negative.  Negative for pallor.  Allergic/Immunologic: Negative.   Neurological: Positive for paresthesias (some tingling). Negative for dizziness, tremors, weakness, light-headedness, numbness and headaches.  Hematological: Negative.  Negative for adenopathy. Does not bruise/bleed easily.  Psychiatric/Behavioral: Negative.  Negative for confusion.       Objective:   Physical Exam  Vitals reviewed. Constitutional: She is oriented to person, place, and time. She appears well-developed and well-nourished. No distress.  HENT:  Head: Normocephalic and atraumatic.  Mouth/Throat: Oropharynx is clear and  moist. No oropharyngeal exudate.  Eyes: Conjunctivae are normal. Right eye exhibits no discharge. Left eye exhibits no discharge. No scleral icterus.  Neck: Normal range of motion. Neck supple. No JVD present. No tracheal deviation present. No thyromegaly present.  Cardiovascular: Normal rate, regular rhythm, normal heart sounds and intact distal pulses.  Exam reveals no gallop and no friction rub.   No murmur heard. Pulmonary/Chest: Effort normal and breath sounds normal. No stridor. No respiratory distress. She has no wheezes. She has no rales. She exhibits no tenderness.  Abdominal: Soft. Bowel sounds are normal. She exhibits no distension and no mass. There is no tenderness. There is no rebound and no guarding.  Musculoskeletal: Normal range of motion. She exhibits no edema and no tenderness.  Lymphadenopathy:    She has no cervical adenopathy.  Neurological: She is oriented to person, place, and time.  Skin: Skin is warm and dry. No rash noted. She is not diaphoretic. No erythema. No pallor.  Psychiatric: She has a normal mood and affect. Her behavior is normal. Judgment and thought content normal.     Lab Results  Component Value Date   WBC 10.0 06/04/2013   HGB 11.9* 10/07/2013   HCT 35.0* 10/07/2013   PLT 286 06/04/2013   GLUCOSE 133* 10/07/2013   CHOL 137 08/06/2012   TRIG 103.0 08/06/2012   HDL 53.60 08/06/2012   LDLDIRECT 160.2 03/01/2010   LDLCALC 63 08/06/2012   ALT 17 10/07/2012   AST 18 10/07/2012   NA 141 10/07/2013   K 3.9 10/07/2013   CL 104 07/09/2013   CREATININE 0.6 07/09/2013   BUN 12 07/09/2013   CO2 30 07/09/2013   TSH 0.58 07/09/2013   HGBA1C 8.1* 07/09/2013   MICROALBUR 3.5* 03/01/2010       Assessment & Plan:

## 2013-10-28 NOTE — Assessment & Plan Note (Signed)
Start ferralet She was asked to come in for a B12 injection

## 2013-10-28 NOTE — Progress Notes (Signed)
Pre visit review using our clinic review tool, if applicable. No additional management support is needed unless otherwise documented below in the visit note. 

## 2013-10-30 ENCOUNTER — Telehealth: Payer: Self-pay | Admitting: *Deleted

## 2013-10-30 ENCOUNTER — Ambulatory Visit (INDEPENDENT_AMBULATORY_CARE_PROVIDER_SITE_OTHER): Payer: 59 | Admitting: *Deleted

## 2013-10-30 DIAGNOSIS — E538 Deficiency of other specified B group vitamins: Secondary | ICD-10-CM

## 2013-10-30 DIAGNOSIS — G63 Polyneuropathy in diseases classified elsewhere: Secondary | ICD-10-CM

## 2013-10-30 MED ORDER — CYANOCOBALAMIN 1000 MCG/ML IJ SOLN
1000.0000 ug | Freq: Once | INTRAMUSCULAR | Status: AC
  Administered 2013-10-30: 1000 ug via INTRAMUSCULAR

## 2013-10-30 MED ORDER — AMLODIPINE-OLMESARTAN 5-40 MG PO TABS: 1.0000 | ORAL_TABLET | Freq: Every morning | ORAL | Status: DC

## 2013-10-30 NOTE — Telephone Encounter (Signed)
Wanted to let md know that she couldn't afford the iron med that he rx, so she bought the ovc Iron 325 mg and started taking. Needing azor sent to her pharmacy, and wanting to give him the envelope to mail the letter for her disability. Place on md desk, and already sent Azor to pharmacy...Theresa Harrington

## 2013-11-02 ENCOUNTER — Encounter: Payer: Self-pay | Admitting: Internal Medicine

## 2013-11-03 NOTE — Telephone Encounter (Signed)
MD mailed lette, also mailed copy to pt...Theresa Harrington

## 2013-11-05 ENCOUNTER — Telehealth: Payer: Self-pay

## 2013-11-05 DIAGNOSIS — E1159 Type 2 diabetes mellitus with other circulatory complications: Secondary | ICD-10-CM

## 2013-11-05 DIAGNOSIS — I10 Essential (primary) hypertension: Secondary | ICD-10-CM

## 2013-11-05 MED ORDER — AMLODIPINE BESYLATE 5 MG PO TABS: 5.0000 mg | ORAL_TABLET | Freq: Every day | ORAL | Status: DC

## 2013-11-05 MED ORDER — OLMESARTAN MEDOXOMIL 40 MG PO TABS: 40.0000 mg | ORAL_TABLET | Freq: Every day | ORAL | Status: DC

## 2013-11-05 NOTE — Telephone Encounter (Signed)
changed

## 2013-11-05 NOTE — Telephone Encounter (Signed)
Received pharmacy rejection stating that insurance will not cover azor without a prior authorization. Preferred alternatives are amlodipine, benicar. Patient has failed amlodipine but must also fail benicar.  Please advise. Thanks

## 2013-11-13 ENCOUNTER — Ambulatory Visit: Payer: 59

## 2013-11-13 ENCOUNTER — Telehealth: Payer: Self-pay

## 2013-11-13 ENCOUNTER — Encounter: Payer: Self-pay | Admitting: Internal Medicine

## 2013-11-13 ENCOUNTER — Ambulatory Visit (INDEPENDENT_AMBULATORY_CARE_PROVIDER_SITE_OTHER): Payer: BC Managed Care – PPO | Admitting: Internal Medicine

## 2013-11-13 ENCOUNTER — Ambulatory Visit (INDEPENDENT_AMBULATORY_CARE_PROVIDER_SITE_OTHER)
Admission: RE | Admit: 2013-11-13 | Discharge: 2013-11-13 | Disposition: A | Discharge status: Home / Self Care | Payer: 59 | Source: Ambulatory Visit | Attending: Internal Medicine | Admitting: Internal Medicine

## 2013-11-13 VITALS — BP 150/68 | HR 59 | Temp 97.8°F | Wt 167.0 lb

## 2013-11-13 DIAGNOSIS — E1159 Type 2 diabetes mellitus with other circulatory complications: Secondary | ICD-10-CM

## 2013-11-13 DIAGNOSIS — R059 Cough, unspecified: Secondary | ICD-10-CM

## 2013-11-13 DIAGNOSIS — L98499 Non-pressure chronic ulcer of skin of other sites with unspecified severity: Principal | ICD-10-CM

## 2013-11-13 DIAGNOSIS — R1319 Other dysphagia: Secondary | ICD-10-CM

## 2013-11-13 DIAGNOSIS — R05 Cough: Secondary | ICD-10-CM

## 2013-11-13 DIAGNOSIS — I739 Peripheral vascular disease, unspecified: Secondary | ICD-10-CM

## 2013-11-13 DIAGNOSIS — E538 Deficiency of other specified B group vitamins: Secondary | ICD-10-CM

## 2013-11-13 DIAGNOSIS — G63 Polyneuropathy in diseases classified elsewhere: Secondary | ICD-10-CM

## 2013-11-13 DIAGNOSIS — B369 Superficial mycosis, unspecified: Secondary | ICD-10-CM

## 2013-11-13 MED ORDER — KETOCONAZOLE 2 % EX CREA: 1.0000 "application " | TOPICAL_CREAM | Freq: Every day | CUTANEOUS | Status: DC

## 2013-11-13 MED ORDER — CYANOCOBALAMIN 1000 MCG/ML IJ SOLN
1000.0000 ug | Freq: Once | INTRAMUSCULAR | Status: AC
  Administered 2013-11-13: 1000 ug via INTRAMUSCULAR

## 2013-11-13 NOTE — Telephone Encounter (Signed)
Phone call ret'd to pt.  Stated she has an area between the left 4th and 5th toe that continues to be sore, and is not healing.  Also reported she has a red area on the side of her left foot, that occurred after she was placed in a boot for one week by the podiatrist.  Denied that the red area on side of left foot is open; reported "only red and sore."  Denied any drainage from the area between the 4th & 5th toes.  Stated her Podiatrist voiced concern about the 3rd toe of the left foot, and reported he stated there may be circulation problems, and recommended she return to see the Vasc. Psychologist, sport and exercise.  Advised will schedule appt. for evaluation and call pt. with appt.  Discussed with the NP.  Advised to repeat ABI's with an office visit.

## 2013-11-13 NOTE — Patient Instructions (Signed)
Your next office appointment will be determined based upon review of your pending x-rays. Those instructions will be transmitted to you by mail.  Take the protein pump inhibitor 30 minutes before breakfast and 30 minutes before the evening meal for 8 weeks then go back to once a day  30 minutes before breakfast.  Reflux of gastric acid may be asymptomatic as this may occur mainly during sleep.The triggers for reflux  include stress; the "aspirin family" ; alcohol; peppermint; and caffeine (coffee, tea, cola, and chocolate). The aspirin family would include aspirin and the nonsteroidal agents such as ibuprofen &  Naproxen. Tylenol would not cause reflux. If having symptoms ; food & drink should be avoided for @ least 2 hours before going to bed.

## 2013-11-13 NOTE — Progress Notes (Signed)
Pre visit review using our clinic review tool, if applicable. No additional management support is needed unless otherwise documented below in the visit note. 

## 2013-11-13 NOTE — Telephone Encounter (Signed)
Spoke with pt to schedule, dpm °

## 2013-11-13 NOTE — Progress Notes (Signed)
   Subjective:    Patient ID: Theresa Harrington, female    DOB: 1956-03-08, 58 y.o.   MRN: [id number]  HPI  She describes a cough mainly at night for 1 month. There are no specific triggers. She denies any significant upper respiratory tract symptoms  She does have food dysphagia one-2 times per day. This has been recurring for the last 2 weeks.  She has had lap band surgery. She had a "fill" 2 months ago, apparently to decrease gastric volume.   Diabetes control has been poor; her most recent A1c was 8.5% on 10/28/13.     Review of Systems  Specifically she denies frontal headache, facial pain, nasal purulence, postnasal drainage, otic pain, or otic discharge.  The cough is nonproductive. She denies any exertional dyspnea.  The dysphagia is not associated with melena or rectal bleeding.  Apparently a podiatrist has been treating the skin lesions of the left foot but these have not healed. He recommended reassessment by peripheral vascular surgeon.     Objective:   Physical Exam General appearance is one of adequate nourishment w/o distress. "Overweight" as per CDC guidelines.  Eyes: No conjunctival inflammation or scleral icterus is present.  Oral exam: Dental hygiene is good; lips and gums are healthy appearing.There is no oropharyngeal erythema or exudate noted.   Heart:  Normal rate and regular rhythm. S1 and S2 normal without gallop, murmur, click, rub or other extra sounds     Lungs:Chest clear to auscultation; no wheezes, rhonchi,rales ,or rubs present.No increased work of breathing.   Abdomen: bowel sounds normal, soft and non-tender without masses, organomegaly or hernias noted.  No guarding or rebound . No tenderness over the flanks to percussion  Musculoskeletal: Able to lie flat and sit up without help. Negative straight leg raising bilaterally. Gait normal  Skin:Warm & dry. Slightly erythematous rash along the distal aspect of the left foot. Minor cracking below the  left fifth toe  Pedal pulses palpable; dorsalis pedis pulses significantly decreased  Lymphatic: No lymphadenopathy is noted about the head, neck, axilla              Assessment & Plan:  #1 cough x1 month  #2 food dysphagia x2 weeks  #3 recent tightening of the bariatric banding device 2 months ago  #4 probable fungal dermatitis lateral aspect of left foot  5 peripheral vascular disease; vascular surgery referral requested.They saw her in 4/15; F/U recommended in 10/15  Plan: Aggressively treat reflux. Increase the PPI to twice a day.  She's been asked to contact the bariatric surgeon concerning her symptoms. If symptoms persist GI referral for endoscopic evaluation.

## 2013-11-13 NOTE — Progress Notes (Signed)
   Subjective:    Patient ID: Theresa Harrington, female    DOB: March 13, 1956, 58 y.o.   MRN: [id number]  HPI Pt presents today with cough x 1 month and no associated URI symptoms. The cough occurs only at nighttime and keeps the patient from sleeping. The cough is productive of clear thick sputum. The cough has not worsened since it began. The pt is unsure if PND is present. The pt is not SOB nor wheezing. She denies pain when coughing.    Pt denies a history of asthma, environmental allergies, and sick exposures. She is a former smoker 1ppdx30 years. She currently takes an ARB.  Pt does have history of GERD and is currently taking protonix. She reports she does feel food sticking with certain meals and finds herself belching more than usual.   Review of Systems  Constitutional: Negative for fever and chills.  HENT: Negative for congestion, dental problem, ear pain, rhinorrhea, sinus pressure, sneezing and sore throat.   Respiratory: Negative for shortness of breath.        Objective:   Physical Exam Gen.: Healthy and well-nourished in appearance. Alert, appropriate and cooperative throughout exam.  Eyes: No corneal or conjunctival inflammation noted. Pupils equal round reactive to light and accommodation.  Ears: External  ear exam reveals no significant lesions or deformities. Cerumen in bilateral canals .TMs normal. Hearing is grossly normal bilaterally. Nose: External nasal exam reveals no deformity or inflammation. Nasal mucosa are pink and moist. No lesions or exudates noted.   Mouth: Oral mucosa and oropharynx reveal no lesions or exudates. Teeth in good repair. Tonsil +1 bilaterally without exudate Lungs: Normal respiratory effort; chest expands symmetrically. Lungs are clear to auscultation  Heart: Normal rate and rhythm. Normal S1 and S2. No gallop, click, or rub. No murmur. Lymph: No cervical, axillary lymphadenopathy present.                 Assessment & Plan:  #1 cough; r/t to GERD or r/t PND. Nasal hygiene plus increase of protonix to BID. Refer to GI

## 2013-11-13 NOTE — Telephone Encounter (Signed)
Message copied by Denman George on Thu Nov 13, 2013 11:39 AM ------      Message from: Garth Bigness P      Created: Wed Nov 12, 2013  4:34 PM      Regarding: Triage       Jalasia, [place] called today to stated that her left foot has 2 sore places that are not healing. She wants to see an MD here, not the NP soon to discuss treatment, she can be reached on her cell #.            Of note, she went to [facility] today for evaluation.                  Thanks,      Hinton Dyer ------

## 2013-11-14 ENCOUNTER — Telehealth: Payer: Self-pay | Admitting: Internal Medicine

## 2013-11-14 ENCOUNTER — Ambulatory Visit (INDEPENDENT_AMBULATORY_CARE_PROVIDER_SITE_OTHER): Payer: 59 | Admitting: Family

## 2013-11-14 ENCOUNTER — Encounter: Payer: Self-pay | Admitting: Family

## 2013-11-14 ENCOUNTER — Ambulatory Visit (HOSPITAL_COMMUNITY)
Admission: RE | Admit: 2013-11-14 | Discharge: 2013-11-14 | Disposition: A | Discharge status: Home / Self Care | Payer: 59 | Source: Ambulatory Visit | Attending: Family | Admitting: Family

## 2013-11-14 VITALS — BP 153/81 | HR 57 | Resp 14 | Ht 65.5 in | Wt 165.0 lb

## 2013-11-14 DIAGNOSIS — L98499 Non-pressure chronic ulcer of skin of other sites with unspecified severity: Principal | ICD-10-CM | POA: Insufficient documentation

## 2013-11-14 DIAGNOSIS — I739 Peripheral vascular disease, unspecified: Secondary | ICD-10-CM

## 2013-11-14 DIAGNOSIS — M79609 Pain in unspecified limb: Secondary | ICD-10-CM

## 2013-11-14 DIAGNOSIS — I7025 Atherosclerosis of native arteries of other extremities with ulceration: Secondary | ICD-10-CM | POA: Insufficient documentation

## 2013-11-14 DIAGNOSIS — R234 Changes in skin texture: Secondary | ICD-10-CM | POA: Insufficient documentation

## 2013-11-14 DIAGNOSIS — L988 Other specified disorders of the skin and subcutaneous tissue: Secondary | ICD-10-CM

## 2013-11-14 DIAGNOSIS — M79675 Pain in left toe(s): Secondary | ICD-10-CM | POA: Insufficient documentation

## 2013-11-14 NOTE — Progress Notes (Signed)
[facility] [place] HISTORY AND PHYSICAL -PAD  History of Present Illness Theresa Harrington is a 58 y.o. female patient of Dr. Trula Slade who returns for c/o new red area on lateral aspect left foot, she has been followed for PAD, no vascular procedures performed.  She has been wearing a UNA boot for the last couple of weeks, under the treatment of her podiatrist to help heal a fissure at the base of her left 5th toe. She was recently evaluated and treated by her podiatrist for tinea pedis, using a topical antifungal agent on her feet; pt states her podiatrist asked her to be evaluated by this office for evidence of vascular disease.  She has not had previous vascular procedure.  She stopped taking her cholesterol medication about a month ago, Walking about 1/4 mile, she had left calf pain, denies non healing wounds, has less calf pain since stopping the statin, relieved by 2-3 minutes rest.  ABI's of April, 2015 had downgraded from normal to moderate arterial occlusive disease in right leg, and stable in left leg.  She is walking more since then.  Her risk factors for progression of atherosclerosis are worsening of her DM control and decreased walking since wearing the UNA boot.   She states she will resume the Pletal She denies history of CHF.  The patient reports New Medical or Surgical History: still has a kidney stone.   Pt Diabetic: Yes, A1C in June, 2015 was 8.5, was advised by her PCP to take Levemir and has not started yet; advised to start as advised.  Pt smoker: former smoker, quit in 2003.  Pt meds include:  Statin :No ASA: yes  Other anticoagulants/antiplatelets: no    Past Medical History  Diagnosis Date  . Hyperlipidemia   . Hypertension   . PONV (postoperative nausea and vomiting)   . Right ureteral stone   . History of kidney stones   . Type 2 diabetes mellitus   . PAD (peripheral artery disease)   . Atherosclerosis of aorta   . GERD (gastroesophageal  reflux disease)   . Asymptomatic cholelithiasis   . Peripheral arterial occlusive disease     lower extremities  . Wears glasses   . Hypothyroidism     no meds    Social History History  Substance Use Topics  . Smoking status: Former Smoker -- 1.00 packs/day for 28 years    Types: Cigarettes    Quit date: 09/05/2001  . Smokeless tobacco: Never Used  . Alcohol Use: No    Family History Family History  Problem Relation Age of Onset  . Arthritis Other   . Cancer Other     colon  . Hypertension Other   . Stroke Other   . Diabetes Father   . Heart disease Father   . Deep vein thrombosis Father   . Hyperlipidemia Father   . Diabetes Sister   . Heart disease Sister   . Deep vein thrombosis Sister   . Hyperlipidemia Sister   . Diabetes Brother   . Heart disease Brother   . Cancer Brother   . Hyperlipidemia Brother   . Colon cancer Maternal Aunt   . Diabetes Brother   . Diabetes Brother   . Kidney disease Mother   . Hyperlipidemia Mother   . Other Mother     AAA   and    Amputation    Past Surgical History  Procedure Laterality Date  . Belpharoptosis repair  eyelid lift  . Cystoscopy with stent placement Right 06/04/2013    Procedure: CYSTOSCOPY WITH STENT PLACEMENT;  Surgeon: Franchot Gallo, MD;  Location: [facility];  Service: Urology;  Laterality: Right;  . Right knee patellectomy w/ repair of extensor mechanism  04-14-2002  . Orif fifth metacarpal fx  right hand  04-21-2002  . Laparoscopic gastric banding  05-29-2005  . Revision and re-siting lap-band port  04-08-2010    W/  UPPER EGD  . Combined augmentation mammaplasty and abdominoplasty  2009    W/  BILATERAL  THIGH LIFT  . Cysto/  right ureteral stent placement  12-30-2010  . Extracorporeal shock wave lithotripsy Right 08-04-2013//   06-23-2013//   01-16-2011  . Cystoscopy with retrograde pyelogram, ureteroscopy and stent placement Right 10/07/2013    Procedure: CYSTOSCOPY WITH RETROGRADE PYELOGRAM, right  URETEROSCOPY AND STENT PLACEMENT, stone extraction;  Surgeon: Arvil Persons, MD;  Location: [facility];  Service: Urology;  Laterality: Right;  . Holmium laser application Right 06/05/9561    Procedure: HOLMIUM LASER APPLICATION;  Surgeon: Arvil Persons, MD;  Location: [facility];  Service: Urology;  Laterality: Right;    Allergies  Allergen Reactions  . Atorvastatin     muscle aches  . Ampicillin Nausea And Vomiting  . Codeine Nausea And Vomiting  . Lisinopril Other (See Comments)     cough  . Penicillins Nausea And Vomiting  . Januvia [Sitagliptin]     nausea    Current Outpatient Prescriptions  Medication Sig Dispense Refill  . amLODipine (NORVASC) 5 MG tablet Take 1 tablet (5 mg total) by mouth daily.  90 tablet  3  . Canagliflozin (INVOKANA) 300 MG TABS Take 1 tablet (300 mg total) by mouth every morning.  30 tablet  11  . cetirizine (ZYRTEC ALLERGY) 10 MG tablet Take 10 mg by mouth daily.      [name] Fe Cbn-Fe Gluc-FA-B12-C-DSS (FERRALET 90) 90-1 MG TABS Take 1 tablet by mouth daily.  30 each  11  . Ferrous Sulfate (IRON) 325 (65 FE) MG TABS Take by mouth daily.      [name] glyBURIDE (DIABETA) 5 MG tablet TAKE ONE TABLET BY MOUTH ONCE DAILY  90 tablet  0  . insulin detemir (LEVEMIR) 100 UNIT/ML injection Inject 0.3 mLs (30 Units total) into the skin at bedtime.  10 mL  11  . ketoconazole (NIZORAL) 2 % cream Apply 1 application topically daily.  15 g  0  . metFORMIN (GLUCOPHAGE) 1000 MG tablet Take 1,000 mg by mouth 2 (two) times daily.      [name] olmesartan (BENICAR) 40 MG tablet Take 1 tablet (40 mg total) by mouth daily.  90 tablet  3  . pantoprazole (PROTONIX) 40 MG tablet Take 40 mg by mouth every morning.        No current facility-administered medications for this visit.    ROS: See HPI for pertinent positives and negatives.   Physical Examination  Filed Vitals:   11/14/13 1035  BP: 153/81  Pulse: 57  Resp: 14  Height: 5' 5.5" (1.664 m)  Weight:  165 lb (74.844 kg)  SpO2: 100%   Body mass index is 27.03 kg/(m^2).  General: A&O x 3, WDWN.  Gait: normal  Eyes: PERRLA.  Pulmonary: Respirations non labored.  Cardiac: regular Rythm   Carotid Bruits  Left  Right    Negative  Negative   Aorta is not palpable.  Radial pulses: 2+ palable   VASCULAR EXAM:  Extremities  without ischemic changes  without Gangrene; without open wounds.                                                                                                           LE Pulses LEFT RIGHT       FEMORAL   palpable   palpable        POPLITEAL  not palpable   not palpable       POSTERIOR TIBIAL  not palpable   not palpable        DORSALIS PEDIS      ANTERIOR TIBIAL not palpable  not palpable    Abdomen: soft, NT, no masses.  Skin: no rashes, no ulcers noted. She has shallow breaks in skin at plantar surface of both feet, base of 5th toes, and scaly soles, typical of tinea pedis, no secondary infections, no ulcerations.  Musculoskeletal: no muscle wasting or atrophy.  Neurologic: A&O X 3; Appropriate Affect ; SENSATION: normal; MOTOR FUNCTION: moving all extremities equally, motor strength 5/5 throughout. Speech is fluent/normal. CN 2-12 intact.   Non-Invasive Vascular Imaging: DATE: 11/14/2013 ABI: RIGHT 0.60, Left: 0.63, all waveforms are monophasic. Previous (08/22/13) ABI's : Right: 0.70, Left: 0.60   ASSESSMENT: Theresa Harrington is a 58 y.o. female who returns for c/o new red area on lateral aspect left foot, she has been followed for PAD, no vascular procedures performed.  She has been wearing a UNA boot for the last couple of weeks, under the treatment of her podiatrist to help heal a fissure at the base of her left 5th toe. Her tinea pedis seems resolved since her last visit, the fissure at the base of the left 5th toe is healing, no signs of infection. She has not had previous vascular procedure.  She stopped taking her cholesterol medication about a  month ago, has less calf pain since stopping the statin, relieved by 2-3 minutes rest.  Her risk factors for progression of atherosclerosis are worsening of her DM control and decreased walking since wearing the UNA boot.  ABI's in the right leg are slightly worse, stable in the left leg in the last 3 months, suggesting moderate bilateral arterial occlusive disease.   She states she will resume the Pletal She denies history of CHF.    PLAN:  Stop the UNA boot which seems to have been irritating the lateral aspect of her left foot. Graduated walking program. Work closely with her PCP to get her DM under as good control as possible to improve endothelial function. I discussed in depth with the patient the nature of atherosclerosis, and emphasized the importance of maximal medical management including strict control of blood pressure, blood glucose, and lipid levels, obtaining regular exercise, and continued cessation of smoking.  The patient is aware that without maximal medical management the underlying atherosclerotic disease process will progress, limiting the benefit of any interventions.  Based on the patient's vascular studies and examination, pt will return to clinic in October as already scheduled with ABI's.  The patient was given information about PAD  including signs, symptoms, treatment, what symptoms should prompt the patient to seek immediate medical care, and risk reduction measures to take.  Clemon Chambers, RN, MSN, FNP-C [facility] Phone: [phone]  Clinic MD: Bridgett Larsson  11/14/2013 10:25 AM

## 2013-11-14 NOTE — Patient Instructions (Signed)

## 2013-11-14 NOTE — Telephone Encounter (Signed)
Patient is requesting a nurse visit to be shown how to inject levemir.  She is requesting you to show her how to inject.  Please advise.

## 2013-11-14 NOTE — Telephone Encounter (Signed)
Ok to schedule nurse visit for any available CMA to assist with.

## 2013-11-14 NOTE — Telephone Encounter (Signed)
Left message for patient to call back to schedule appt.

## 2013-11-26 ENCOUNTER — Encounter: Payer: Self-pay | Admitting: Internal Medicine

## 2013-11-26 ENCOUNTER — Ambulatory Visit (INDEPENDENT_AMBULATORY_CARE_PROVIDER_SITE_OTHER): Payer: BC Managed Care – PPO | Admitting: Internal Medicine

## 2013-11-26 VITALS — BP 120/60 | HR 92 | Temp 97.9°F | Resp 16 | Ht 65.5 in | Wt 161.8 lb

## 2013-11-26 DIAGNOSIS — E538 Deficiency of other specified B group vitamins: Secondary | ICD-10-CM

## 2013-11-26 DIAGNOSIS — R05 Cough: Secondary | ICD-10-CM

## 2013-11-26 DIAGNOSIS — E785 Hyperlipidemia, unspecified: Secondary | ICD-10-CM

## 2013-11-26 DIAGNOSIS — G63 Polyneuropathy in diseases classified elsewhere: Secondary | ICD-10-CM

## 2013-11-26 DIAGNOSIS — I10 Essential (primary) hypertension: Secondary | ICD-10-CM

## 2013-11-26 DIAGNOSIS — R059 Cough, unspecified: Secondary | ICD-10-CM

## 2013-11-26 DIAGNOSIS — E1159 Type 2 diabetes mellitus with other circulatory complications: Secondary | ICD-10-CM

## 2013-11-26 MED ORDER — CYANOCOBALAMIN 500 MCG/0.1ML NA SOLN: 0.1000 mL | NASAL | Status: DC

## 2013-11-26 MED ORDER — PITAVASTATIN CALCIUM 2 MG PO TABS: 1.0000 | ORAL_TABLET | Freq: Every day | ORAL | Status: DC

## 2013-11-26 MED ORDER — UMECLIDINIUM BROMIDE 62.5 MCG/INH IN AEPB: 1.0000 | INHALATION_SPRAY | Freq: Every day | RESPIRATORY_TRACT | Status: DC

## 2013-11-26 MED ORDER — LOSARTAN POTASSIUM-HCTZ 100-12.5 MG PO TABS: 1.0000 | ORAL_TABLET | Freq: Every day | ORAL | Status: DC

## 2013-11-26 MED ORDER — INSULIN DETEMIR 100 UNIT/ML ~~LOC~~ SOLN: 30.0000 [IU] | Freq: Every day | SUBCUTANEOUS | Status: DC

## 2013-11-26 NOTE — Assessment & Plan Note (Signed)
Her BP is well controlled 

## 2013-11-26 NOTE — Assessment & Plan Note (Signed)
She has not started levemir I showed her how to give the injection today

## 2013-11-26 NOTE — Assessment & Plan Note (Signed)
I am concerned that she may have chronic bronchitis or COPD so will start Incruse I have also asker her to have PFT's done

## 2013-11-26 NOTE — Progress Notes (Signed)
Subjective:    Patient ID: Theresa Harrington, female    DOB: 1956/03/24, 58 y.o.   MRN: [id number]  Cough This is a recurrent problem. The current episode started more than 1 month ago. The problem has been unchanged. The problem occurs constantly. The cough is productive of sputum. Associated symptoms include postnasal drip, shortness of breath and wheezing. Pertinent negatives include no chest pain, chills, ear congestion, ear pain, fever, headaches, heartburn, hemoptysis, myalgias, nasal congestion, rash, rhinorrhea, sore throat, sweats or weight loss. Nothing aggravates the symptoms. Risk factors for lung disease include smoking/tobacco exposure. She has tried nothing for the symptoms. Her past medical history is significant for environmental allergies. There is no history of asthma, bronchiectasis, bronchitis, COPD, emphysema or pneumonia.      Review of Systems  Constitutional: Negative.  Negative for fever, chills, weight loss, diaphoresis, appetite change, fatigue and unexpected weight change.  HENT: Positive for postnasal drip. Negative for ear pain, rhinorrhea, sinus pressure, sore throat, trouble swallowing and voice change.   Eyes: Negative.   Respiratory: Positive for cough, shortness of breath and wheezing. Negative for apnea, hemoptysis, choking, chest tightness and stridor.   Cardiovascular: Negative.  Negative for chest pain, palpitations and leg swelling.  Gastrointestinal: Negative.  Negative for heartburn, nausea, vomiting, abdominal pain, diarrhea, constipation and blood in stool.  Endocrine: Positive for polyuria. Negative for polydipsia and polyphagia.  Genitourinary: Negative.   Musculoskeletal: Negative.  Negative for myalgias.  Skin: Negative.  Negative for rash.  Allergic/Immunologic: Positive for environmental allergies.  Neurological: Negative.  Negative for headaches.  Hematological: Negative.  Negative for adenopathy. Does not bruise/bleed easily.    Psychiatric/Behavioral: Negative.        Objective:   Physical Exam  Vitals reviewed. Constitutional: She is oriented to person, place, and time. She appears well-developed and well-nourished.  Non-toxic appearance. She does not have a sickly appearance. She does not appear ill. No distress.  HENT:  Head: Normocephalic and atraumatic.  Mouth/Throat: Oropharynx is clear and moist. No oropharyngeal exudate.  Eyes: Conjunctivae are normal. Right eye exhibits no discharge. Left eye exhibits no discharge. No scleral icterus.  Neck: Normal range of motion. Neck supple. No JVD present. No tracheal deviation present. No thyromegaly present.  Cardiovascular: Normal rate, regular rhythm, normal heart sounds and intact distal pulses.  Exam reveals no gallop and no friction rub.   No murmur heard. Pulmonary/Chest: Effort normal and breath sounds normal. No accessory muscle usage or stridor. Not tachypneic. No respiratory distress. She has no decreased breath sounds. She has no wheezes. She has no rhonchi. She has no rales. She exhibits no tenderness.  Abdominal: Soft. Bowel sounds are normal. She exhibits no distension and no mass. There is no tenderness. There is no rebound and no guarding.  Musculoskeletal: Normal range of motion. She exhibits no edema and no tenderness.  Lymphadenopathy:    She has no cervical adenopathy.  Neurological: She is oriented to person, place, and time.  Skin: Skin is warm and dry. No rash noted. She is not diaphoretic. No erythema. No pallor.  Psychiatric: She has a normal mood and affect. Her behavior is normal. Judgment and thought content normal.      Lab Results  Component Value Date   WBC 8.5 10/28/2013   HGB 11.7* 10/28/2013   HCT 35.5* 10/28/2013   PLT 324.0 10/28/2013   GLUCOSE 115* 10/28/2013   CHOL 197 10/28/2013   TRIG 129.0 10/28/2013   HDL 57.40 10/28/2013   LDLDIRECT 160.2  03/01/2010   LDLCALC 114* 10/28/2013   ALT 11 10/28/2013   AST 18 10/28/2013   NA  141 10/28/2013   K 3.9 10/28/2013   CL 108 10/28/2013   CREATININE 0.6 10/28/2013   BUN 12 10/28/2013   CO2 24 10/28/2013   TSH 0.09* 10/28/2013   HGBA1C 8.5* 10/28/2013   MICROALBUR 3.5* 03/01/2010      Assessment & Plan:

## 2013-11-26 NOTE — Assessment & Plan Note (Signed)
She will start livalo for this

## 2013-11-26 NOTE — Progress Notes (Signed)
Pre visit review using our clinic review tool, if applicable. No additional management support is needed unless otherwise documented below in the visit note. 

## 2013-11-26 NOTE — Patient Instructions (Signed)
Cough, Adult  A cough is a reflex that helps clear your throat and airways. It can help heal the body or may be a reaction to an irritated airway. A cough may only last 2 or 3 weeks (acute) or may last more than 8 weeks (chronic).  CAUSES Acute cough:  Viral or bacterial infections. Chronic cough:  Infections.  Allergies.  Asthma.  Post-nasal drip.  Smoking.  Heartburn or acid reflux.  Some medicines.  Chronic lung problems (COPD).  Cancer. SYMPTOMS   Cough.  Fever.  Chest pain.  Increased breathing rate.  High-pitched whistling sound when breathing (wheezing).  Colored mucus that you cough up (sputum). TREATMENT   A bacterial cough may be treated with antibiotic medicine.  A viral cough must run its course and will not respond to antibiotics.  Your caregiver may recommend other treatments if you have a chronic cough. HOME CARE INSTRUCTIONS   Only take over-the-counter or prescription medicines for pain, discomfort, or fever as directed by your caregiver. Use cough suppressants only as directed by your caregiver.  Use a cold steam vaporizer or humidifier in your bedroom or home to help loosen secretions.  Sleep in a semi-upright position if your cough is worse at night.  Rest as needed.  Stop smoking if you smoke. SEEK IMMEDIATE MEDICAL CARE IF:   You have pus in your sputum.  Your cough starts to worsen.  You cannot control your cough with suppressants and are losing sleep.  You begin coughing up blood.  You have difficulty breathing.  You develop pain which is getting worse or is uncontrolled with medicine.  You have a fever. MAKE SURE YOU:   Understand these instructions.  Will watch your condition.  Will get help right away if you are not doing well or get worse. Document Released: 10/14/2010 Document Revised: 07/10/2011 Document Reviewed: 10/14/2010 Exit[employer] Patient Information 2015 [employer]. This information is not intended  to replace advice given to you by your health care provider. Make sure you discuss any questions you have with your health care provider.  

## 2013-11-26 NOTE — Assessment & Plan Note (Signed)
She does not like the inconvenience of the B12 injection, will try nascobal ns

## 2013-11-27 ENCOUNTER — Encounter (INDEPENDENT_AMBULATORY_CARE_PROVIDER_SITE_OTHER): Payer: Self-pay

## 2013-11-27 ENCOUNTER — Telehealth: Payer: Self-pay | Admitting: Internal Medicine

## 2013-11-27 ENCOUNTER — Ambulatory Visit (INDEPENDENT_AMBULATORY_CARE_PROVIDER_SITE_OTHER): Payer: 59 | Admitting: Physician Assistant

## 2013-11-27 VITALS — BP 122/70 | HR 75 | Temp 97.5°F | Ht 65.0 in | Wt 161.2 lb

## 2013-11-27 DIAGNOSIS — E1159 Type 2 diabetes mellitus with other circulatory complications: Secondary | ICD-10-CM

## 2013-11-27 DIAGNOSIS — Z4651 Encounter for fitting and adjustment of gastric lap band: Secondary | ICD-10-CM

## 2013-11-27 MED ORDER — INSULIN DETEMIR 100 UNIT/ML FLEXPEN: 30.0000 [IU] | PEN_INJECTOR | Freq: Every day | SUBCUTANEOUS | Status: DC

## 2013-11-27 NOTE — Telephone Encounter (Signed)
Patient called and states that she needs her Levemir rx chnaged to the pen type. Please advise.

## 2013-11-27 NOTE — Progress Notes (Signed)
  HISTORY: Theresa Harrington is a 58 y.o.female who received a 10cm lap-band in January 2007 by Dr. Lucia Gaskins. The patient has lost 15 lbs since their last visit in June, and has lost 84 lbs since surgery. She has a complaint of persistent solid food dysphagia and night cough since just after her fill with Dr. Lucia Gaskins in June. She is currently on Pantoprazole. She has no complaints of fever. She complains of being tired during the day as she has gotten little sleep during the night. She would like some fluid removed.  VITAL SIGNS: Filed Vitals:   11/27/13 1111  BP: 122/70  Pulse: 75  Temp: 97.5 F (36.4 C)    PHYSICAL EXAM: Physical exam reveals a very well-appearing 58 y.o.female in no apparent distress Neurologic: Awake, alert, oriented Psych: Bright affect, conversant Respiratory: Breathing even and unlabored. No stridor or wheezing Abdomen: Soft, nontender, nondistended to palpation. Incisions well-healed. No incisional hernias. Port easily palpated. Extremities: Atraumatic, good range of motion.  ASSESMENT: 58 y.o.  female  s/p 10cm lap-band.   PLAN: The patient's port was accessed with a 20G Huber needle without difficulty. Clear fluid was aspirated and 0.3 mL saline was removed from the port to give a total predicted volume of 2.3 mL. The patient was advised to concentrate on healthy food choices and to avoid slider foods high in fats and carbohydrates. I asked her to contact us should she have continued symptoms despite today's measures. She voiced understanding and agreement. I encouraged her to continue the pantoprazole as previously prescribed. If she does well, she will follow-up with Dr. Lucia Gaskins again in January 2016.

## 2013-11-27 NOTE — Patient Instructions (Signed)
Return in January as scheduled. Focus on good food choices as well as physical activity. Contact us sooner should you have persistent regurgitation and/or night cough despite today's measures.

## 2013-11-27 NOTE — Telephone Encounter (Signed)
Is this ok ? Please advise

## 2013-12-01 ENCOUNTER — Other Ambulatory Visit: Payer: Self-pay

## 2013-12-01 MED ORDER — INSULIN DETEMIR 100 UNIT/ML FLEXPEN: 30.0000 [IU] | PEN_INJECTOR | Freq: Every day | SUBCUTANEOUS | Status: DC

## 2013-12-04 ENCOUNTER — Encounter (INDEPENDENT_AMBULATORY_CARE_PROVIDER_SITE_OTHER): Payer: 59

## 2013-12-11 ENCOUNTER — Ambulatory Visit
Admission: RE | Admit: 2013-12-11 | Discharge: 2013-12-11 | Disposition: A | Discharge status: Home / Self Care | Payer: 59 | Source: Ambulatory Visit | Attending: Physician Assistant | Admitting: Physician Assistant

## 2013-12-11 ENCOUNTER — Encounter (INDEPENDENT_AMBULATORY_CARE_PROVIDER_SITE_OTHER): Payer: Self-pay

## 2013-12-11 ENCOUNTER — Other Ambulatory Visit (INDEPENDENT_AMBULATORY_CARE_PROVIDER_SITE_OTHER): Payer: 59

## 2013-12-11 ENCOUNTER — Ambulatory Visit: Payer: 59 | Admitting: Dietician

## 2013-12-11 ENCOUNTER — Other Ambulatory Visit: Payer: Self-pay | Admitting: Internal Medicine

## 2013-12-11 ENCOUNTER — Ambulatory Visit (INDEPENDENT_AMBULATORY_CARE_PROVIDER_SITE_OTHER): Payer: 59 | Admitting: *Deleted

## 2013-12-11 ENCOUNTER — Ambulatory Visit (INDEPENDENT_AMBULATORY_CARE_PROVIDER_SITE_OTHER): Payer: 59 | Admitting: Physician Assistant

## 2013-12-11 VITALS — BP 118/68 | HR 72 | Temp 98.6°F | Resp 14 | Ht 65.0 in | Wt 164.6 lb

## 2013-12-11 DIAGNOSIS — Z4651 Encounter for fitting and adjustment of gastric lap band: Secondary | ICD-10-CM

## 2013-12-11 DIAGNOSIS — N390 Urinary tract infection, site not specified: Secondary | ICD-10-CM

## 2013-12-11 DIAGNOSIS — E538 Deficiency of other specified B group vitamins: Secondary | ICD-10-CM

## 2013-12-11 DIAGNOSIS — G63 Polyneuropathy in diseases classified elsewhere: Secondary | ICD-10-CM

## 2013-12-11 LAB — URINALYSIS, ROUTINE W REFLEX MICROSCOPIC
BILIRUBIN URINE: NEGATIVE
HGB URINE DIPSTICK: NEGATIVE
Ketones, ur: NEGATIVE
Leukocytes, UA: NEGATIVE
NITRITE: NEGATIVE
RBC / HPF: NONE SEEN (ref 0–?)
Specific Gravity, Urine: 1.01 (ref 1.000–1.030)
Total Protein, Urine: NEGATIVE
Urine Glucose: >=1000 — AB
Urobilinogen, UA: 0.2 (ref 0.0–1.0)
pH: 6 (ref 5.0–8.0)

## 2013-12-11 MED ORDER — CYANOCOBALAMIN 1000 MCG/ML IJ SOLN
1000.0000 ug | Freq: Once | INTRAMUSCULAR | Status: AC
  Administered 2013-12-11: 1000 ug via INTRAMUSCULAR

## 2013-12-11 NOTE — Progress Notes (Signed)
  HISTORY: Theresa Harrington is a 58 y.o.female who received an 10cm lap-band in January 2007 by Dr. Lucia Gaskins. The patient has gained 3 lbs since their last visit in late July, and has lost 81 lbs since surgery. She was seen on 7/30 by me for night cough. 0.3 mL fluid was removed but the patient comes in today with continued night cough. She has no cough during the day. She eats salads without difficulty. She has no complaint of persistent regurgitation. She has a known history of GERD and is taking a PPI daily.  VITAL SIGNS: Filed Vitals:   12/11/13 1007  BP: 118/68  Pulse: 72  Temp: 98.6 F (37 C)  Resp: 14    PHYSICAL EXAM: Physical exam reveals a very well-appearing 58 y.o.female in no apparent distress Neurologic: Awake, alert, oriented Psych: Bright affect, conversant Respiratory: Breathing even and unlabored. No stridor or wheezing Abdomen: Soft, nontender, nondistended to palpation. Incisions well-healed. No incisional hernias. Port easily palpated. Extremities: Atraumatic, good range of motion.  ASSESMENT: 58 y.o.  female  s/p 10cm lap-band.   PLAN: I'm concerned that the cough may be related to the band being too tight. We discussed how persistent night cough from over-restriction from the band could yield pneumonia. We agreed to go on a band holiday for one week then re-evaluate. The patient's port was accessed with a 20G Huber needle without difficulty. Clear fluid was aspirated and 1.8 mL saline was removed from the port to give a total predicted volume of 0 mL. The patient was advised to concentrate on healthy food choices and to avoid slider foods high in fats and carbohydrates. I ordered a KUB for today to evaluate band position.

## 2013-12-11 NOTE — Patient Instructions (Signed)
Return in one week. Focus on good food choices as well as physical activity. Return sooner if you have an increase in hunger, portion sizes or weight. Return also for difficulty swallowing, night cough, reflux.   

## 2013-12-12 LAB — URINE CULTURE
COLONY COUNT: NO GROWTH
ORGANISM ID, BACTERIA: NO GROWTH

## 2013-12-18 ENCOUNTER — Encounter (INDEPENDENT_AMBULATORY_CARE_PROVIDER_SITE_OTHER): Payer: Self-pay

## 2013-12-18 ENCOUNTER — Ambulatory Visit (INDEPENDENT_AMBULATORY_CARE_PROVIDER_SITE_OTHER): Payer: 59 | Admitting: Physician Assistant

## 2013-12-18 VITALS — BP 110/66 | HR 64 | Temp 98.6°F | Resp 14 | Ht 65.0 in | Wt 167.1 lb

## 2013-12-18 DIAGNOSIS — Z4651 Encounter for fitting and adjustment of gastric lap band: Secondary | ICD-10-CM

## 2013-12-18 NOTE — Patient Instructions (Signed)

## 2013-12-18 NOTE — Progress Notes (Signed)
  HISTORY: Theresa Harrington is a 58 y.o.female who received an 10cm lap-band in July 2007 by Dr. Lucia Gaskins. The patient has gained 2.8 lbs since their last visit in [facility] August when all fluid was removed, and has lost 78 lbs since surgery. She had a KUB which revealed a normally positioned band. She reports having no further nocturnal reflux symptoms and as such she is able to lay down flat without fear of trouble. She does describe being able to eat more, however, so she would like to get back on track with fills but not to the point of obstruction.  VITAL SIGNS: Filed Vitals:   12/18/13 1153  BP: 110/66  Pulse: 64  Temp: 98.6 F (37 C)  Resp: 14    PHYSICAL EXAM: Physical exam reveals a very well-appearing 58 y.o.female in no apparent distress Neurologic: Awake, alert, oriented Psych: Bright affect, conversant Respiratory: Breathing even and unlabored. No stridor or wheezing Abdomen: Soft, nontender, nondistended to palpation. Incisions well-healed. No incisional hernias. Port easily palpated. Extremities: Atraumatic, good range of motion.  ASSESMENT: 58 y.o.  female  s/p 10cm lap-band.   PLAN: The patient's port was accessed with a 20G Huber needle without difficulty. Clear fluid was aspirated and 1.2 mL saline was added to the port to give a total predicted volume of 1.2 mL. The patient was able to swallow water without difficulty following the procedure and was instructed to take clear liquids for the next 24-48 hours and advance slowly as tolerated. We'll have her back in six weeks or sooner if needed.

## 2013-12-20 ENCOUNTER — Other Ambulatory Visit: Payer: Self-pay | Admitting: Internal Medicine

## 2013-12-23 ENCOUNTER — Other Ambulatory Visit: Payer: Self-pay

## 2013-12-23 MED ORDER — INSULIN PEN NEEDLE 32G X 6 MM MISC: Status: DC

## 2013-12-23 MED ORDER — GLYBURIDE 5 MG PO TABS: ORAL_TABLET | ORAL | Status: DC

## 2013-12-24 ENCOUNTER — Other Ambulatory Visit: Payer: Self-pay

## 2013-12-24 MED ORDER — INSULIN PEN NEEDLE 32G X 6 MM MISC: Status: DC

## 2013-12-30 ENCOUNTER — Telehealth: Payer: Self-pay | Admitting: *Deleted

## 2013-12-30 MED ORDER — INSULIN PEN NEEDLE 32G X 4 MM MISC: Status: DC

## 2013-12-30 NOTE — Telephone Encounter (Signed)
Left smg on triage stating received order for insulin pen needle 32G x6 mm, but they are not able to get that size. Do have the 32G x20mm wanting to know is it ok to change. Sent rx electronically...Theresa Harrington

## 2014-01-08 DIAGNOSIS — Z0279 Encounter for issue of other medical certificate: Secondary | ICD-10-CM

## 2014-01-09 ENCOUNTER — Ambulatory Visit: Payer: BC Managed Care – PPO | Admitting: *Deleted

## 2014-01-19 ENCOUNTER — Telehealth: Payer: Self-pay | Admitting: Internal Medicine

## 2014-01-19 MED ORDER — INSULIN PEN NEEDLE 32G X 6 MM MISC: Status: DC

## 2014-01-19 NOTE — Telephone Encounter (Signed)
Called pt no answer LMOM rx faxed to [facility].../lmb  

## 2014-01-19 NOTE — Telephone Encounter (Signed)
Pt called in and stated they have the wrong size needles.  Pt needs Insulin Pen Needle 32 616 MM MISC [[id number]].  She wants it sent to [facility] on [address]

## 2014-01-21 ENCOUNTER — Telehealth: Payer: Self-pay | Admitting: Internal Medicine

## 2014-01-21 MED ORDER — INSULIN PEN NEEDLE 32G X 4 MM MISC: Status: DC

## 2014-01-21 NOTE — Telephone Encounter (Signed)
Rx for novofine plus 32g x 90mm (1/6) sent per pt request.

## 2014-01-21 NOTE — Telephone Encounter (Signed)
Pt came by requesting 32Gx69mm (1/6").  Pt states the 1/4" is the one that keeps getting sent in for refill.  It must be 1/6" per pt.  Please send to [facility] in [place] for refill please.

## 2014-01-27 ENCOUNTER — Ambulatory Visit: Payer: 59 | Admitting: *Deleted

## 2014-01-28 ENCOUNTER — Ambulatory Visit: Payer: BC Managed Care – PPO | Admitting: Internal Medicine

## 2014-01-29 ENCOUNTER — Encounter (INDEPENDENT_AMBULATORY_CARE_PROVIDER_SITE_OTHER): Payer: 59

## 2014-02-02 ENCOUNTER — Other Ambulatory Visit: Payer: Self-pay | Admitting: Internal Medicine

## 2014-02-03 ENCOUNTER — Ambulatory Visit: Payer: BC Managed Care – PPO | Admitting: Internal Medicine

## 2014-02-05 ENCOUNTER — Other Ambulatory Visit (INDEPENDENT_AMBULATORY_CARE_PROVIDER_SITE_OTHER): Payer: 59

## 2014-02-05 ENCOUNTER — Encounter: Payer: Self-pay | Admitting: Internal Medicine

## 2014-02-05 ENCOUNTER — Ambulatory Visit (INDEPENDENT_AMBULATORY_CARE_PROVIDER_SITE_OTHER): Payer: 59 | Admitting: Internal Medicine

## 2014-02-05 VITALS — BP 110/72 | HR 60 | Temp 97.8°F | Resp 16 | Wt 171.0 lb

## 2014-02-05 DIAGNOSIS — E1151 Type 2 diabetes mellitus with diabetic peripheral angiopathy without gangrene: Secondary | ICD-10-CM

## 2014-02-05 DIAGNOSIS — N390 Urinary tract infection, site not specified: Secondary | ICD-10-CM

## 2014-02-05 DIAGNOSIS — E785 Hyperlipidemia, unspecified: Secondary | ICD-10-CM

## 2014-02-05 DIAGNOSIS — E0580 Other thyrotoxicosis without thyrotoxic crisis or storm: Secondary | ICD-10-CM

## 2014-02-05 DIAGNOSIS — I10 Essential (primary) hypertension: Secondary | ICD-10-CM

## 2014-02-05 DIAGNOSIS — Z23 Encounter for immunization: Secondary | ICD-10-CM

## 2014-02-05 LAB — BASIC METABOLIC PANEL
BUN: 16 mg/dL (ref 6–23)
CHLORIDE: 103 meq/L (ref 96–112)
CO2: 29 mEq/L (ref 19–32)
Calcium: 9.8 mg/dL (ref 8.4–10.5)
Creatinine, Ser: 0.9 mg/dL (ref 0.4–1.2)
GFR: 71.34 mL/min (ref >60.00)
Glucose, Bld: 115 mg/dL — ABNORMAL HIGH (ref 70–99)
POTASSIUM: 4 meq/L (ref 3.5–5.1)
Sodium: 139 mEq/L (ref 135–145)

## 2014-02-05 LAB — URINALYSIS, ROUTINE W REFLEX MICROSCOPIC
Bilirubin Urine: NEGATIVE
Ketones, ur: NEGATIVE
Nitrite: NEGATIVE
Specific Gravity, Urine: >=1.03 — AB (ref 1.000–1.030)
TOTAL PROTEIN, URINE-UPE24: 100 — AB
Urobilinogen, UA: 0.2 (ref 0.0–1.0)
pH: 5.5 (ref 5.0–8.0)

## 2014-02-05 LAB — LIPID PANEL
CHOL/HDL RATIO: 5
Cholesterol: 227 mg/dL — ABNORMAL HIGH (ref 0–200)
HDL: 46.4 mg/dL (ref >39.00)
LDL Cholesterol: 153 mg/dL — ABNORMAL HIGH (ref 0–99)
NONHDL: 180.6
Triglycerides: 140 mg/dL (ref 0.0–149.0)
VLDL: 28 mg/dL (ref 0.0–40.0)

## 2014-02-05 LAB — T3, FREE: T3 FREE: 2.8 pg/mL (ref 2.3–4.2)

## 2014-02-05 LAB — T4, FREE: Free T4: 0.98 ng/dL (ref 0.60–1.60)

## 2014-02-05 LAB — TSH: TSH: 0.49 u[IU]/mL (ref 0.35–4.50)

## 2014-02-05 LAB — HEMOGLOBIN A1C: HEMOGLOBIN A1C: 8 % — AB (ref 4.6–6.5)

## 2014-02-05 MED ORDER — EZETIMIBE 10 MG PO TABS: 10.0000 mg | ORAL_TABLET | Freq: Every day | ORAL | Status: DC

## 2014-02-05 MED ORDER — LEVOFLOXACIN 500 MG PO TABS: 500.0000 mg | ORAL_TABLET | Freq: Every day | ORAL | Status: DC

## 2014-02-05 NOTE — Patient Instructions (Signed)

## 2014-02-05 NOTE — Progress Notes (Signed)
Subjective:    Patient ID: Theresa Harrington, female    DOB: [birth year], 58 y.o.   MRN: [id number]  Dysuria  This is a recurrent problem. The current episode started in the past 7 days. The problem occurs every urination. The problem has been gradually worsening. The quality of the pain is described as burning. The pain is at a severity of 1/10. The patient is experiencing no pain. There has been no fever. She is not sexually active. There is a history of pyelonephritis. Associated symptoms include chills, frequency and nausea. Pertinent negatives include no discharge, flank pain, hematuria, hesitancy, sweats, urgency or vomiting. She has tried nothing for the symptoms. Her past medical history is significant for kidney stones and recurrent UTIs. There is no history of catheterization, a single kidney, urinary stasis or a urological procedure.      Review of Systems  Constitutional: Positive for chills. Negative for fever, diaphoresis, activity change, appetite change, fatigue and unexpected weight change.  HENT: Negative.   Eyes: Negative.   Respiratory: Negative.  Negative for cough, choking, chest tightness, shortness of breath and stridor.   Cardiovascular: Negative.  Negative for chest pain, palpitations and leg swelling.  Gastrointestinal: Positive for nausea. Negative for vomiting, abdominal pain, diarrhea, constipation, blood in stool and rectal pain.  Endocrine: Positive for polyuria. Negative for polydipsia and polyphagia.  Genitourinary: Positive for dysuria and frequency. Negative for hesitancy, urgency, hematuria, flank pain, enuresis, difficulty urinating, vaginal pain, pelvic pain and dyspareunia.  Musculoskeletal: Negative.  Negative for arthralgias, back pain, joint swelling and myalgias.  Skin: Negative.  Negative for rash.  Allergic/Immunologic: Negative.   Neurological: Negative.  Negative for dizziness, syncope, speech difficulty, weakness, light-headedness, numbness and  headaches.  Hematological: Negative.  Negative for adenopathy. Does not bruise/bleed easily.  Psychiatric/Behavioral: Negative.        Objective:   Physical Exam  Vitals reviewed. Constitutional: She is oriented to person, place, and time. She appears well-developed and well-nourished.  Non-toxic appearance. She does not have a sickly appearance. She does not appear ill. No distress.  HENT:  Head: Normocephalic and atraumatic.  Nose: Nose normal.  Mouth/Throat: Oropharynx is clear and moist. No oropharyngeal exudate.  Eyes: Conjunctivae are normal. Right eye exhibits no discharge. Left eye exhibits no discharge. No scleral icterus.  Neck: Normal range of motion. Neck supple. No JVD present. No tracheal deviation present. No thyromegaly present.  Cardiovascular: Normal rate, regular rhythm, normal heart sounds and intact distal pulses.  Exam reveals no gallop and no friction rub.   No murmur heard. Pulmonary/Chest: Effort normal and breath sounds normal. No stridor. No respiratory distress. She has no wheezes. She has no rales. She exhibits no tenderness.  Abdominal: Soft. Normal appearance and bowel sounds are normal. She exhibits no distension and no mass. There is no hepatosplenomegaly, splenomegaly or hepatomegaly. There is no tenderness. There is no rebound, no guarding and no CVA tenderness.  Musculoskeletal: Normal range of motion. She exhibits no edema and no tenderness.  Lymphadenopathy:    She has no cervical adenopathy.  Neurological: She is oriented to person, place, and time.  Skin: Skin is warm and dry. No rash noted. She is not diaphoretic. No erythema. No pallor.     Lab Results  Component Value Date   WBC 8.5 10/28/2013   HGB 11.7* 10/28/2013   HCT 35.5* 10/28/2013   PLT 324.0 10/28/2013   GLUCOSE 115* 10/28/2013   CHOL 197 10/28/2013   TRIG 129.0 10/28/2013   HDL  57.40 10/28/2013   LDLDIRECT 160.2 03/01/2010   LDLCALC 114* 10/28/2013   ALT 11 10/28/2013   AST 18  10/28/2013   NA 141 10/28/2013   K 3.9 10/28/2013   CL 108 10/28/2013   CREATININE 0.6 10/28/2013   BUN 12 10/28/2013   CO2 24 10/28/2013   TSH 0.09* 10/28/2013   HGBA1C 8.5* 10/28/2013   MICROALBUR 3.5* 03/01/2010       Assessment & Plan:

## 2014-02-05 NOTE — Assessment & Plan Note (Signed)
She has s/s of recurrence Will start levaquin

## 2014-02-05 NOTE — Assessment & Plan Note (Signed)
Her blood sugars have improved some

## 2014-02-05 NOTE — Assessment & Plan Note (Signed)
Her TFT's are normal and she appears euthyroid

## 2014-02-05 NOTE — Progress Notes (Signed)
Pre visit review using our clinic review tool, if applicable. No additional management support is needed unless otherwise documented below in the visit note. 

## 2014-02-05 NOTE — Assessment & Plan Note (Signed)
She has not achieved her LDL goal - I have asked her to be more compliant with livalo and to start zetia

## 2014-02-06 ENCOUNTER — Telehealth: Payer: Self-pay | Admitting: Internal Medicine

## 2014-02-06 LAB — CULTURE, URINE COMPREHENSIVE
COLONY COUNT: NO GROWTH
Organism ID, Bacteria: NO GROWTH

## 2014-02-06 NOTE — Telephone Encounter (Signed)
emmi mailed  °

## 2014-02-08 ENCOUNTER — Encounter: Payer: Self-pay | Admitting: Internal Medicine

## 2014-02-12 ENCOUNTER — Telehealth: Payer: Self-pay | Admitting: Internal Medicine

## 2014-02-12 NOTE — Telephone Encounter (Signed)
Pt requests a call regarding a prescription- [facility] notified her about (antibiotic). She states she has not rec'd any test results and wondering what the antibiotic is for. Please advise [phone].

## 2014-02-12 NOTE — Telephone Encounter (Signed)
LMOVM advising of positive U/A

## 2014-02-19 ENCOUNTER — Ambulatory Visit (INDEPENDENT_AMBULATORY_CARE_PROVIDER_SITE_OTHER): Payer: 59 | Admitting: Internal Medicine

## 2014-02-19 ENCOUNTER — Encounter: Payer: Self-pay | Admitting: Family

## 2014-02-19 ENCOUNTER — Encounter: Payer: Self-pay | Admitting: Internal Medicine

## 2014-02-19 VITALS — BP 130/90 | HR 80 | Temp 97.4°F | Resp 16 | Wt 163.0 lb

## 2014-02-19 DIAGNOSIS — R109 Unspecified abdominal pain: Secondary | ICD-10-CM

## 2014-02-19 DIAGNOSIS — R300 Dysuria: Secondary | ICD-10-CM

## 2014-02-19 DIAGNOSIS — I10 Essential (primary) hypertension: Secondary | ICD-10-CM

## 2014-02-19 DIAGNOSIS — E785 Hyperlipidemia, unspecified: Secondary | ICD-10-CM

## 2014-02-19 DIAGNOSIS — N201 Calculus of ureter: Secondary | ICD-10-CM

## 2014-02-19 LAB — POCT URINALYSIS DIPSTICK
Bilirubin, UA: NEGATIVE
Ketones, UA: NEGATIVE
Nitrite, UA: NEGATIVE
PH UA: 7
SPEC GRAV UA: 1.025
UROBILINOGEN UA: NEGATIVE

## 2014-02-19 NOTE — Patient Instructions (Signed)
Flank Pain °Flank pain refers to pain that is located on the side of the body between the upper abdomen and the back. The pain may occur over a short period of time (acute) or may be long-term or reoccurring (chronic). It may be mild or severe. Flank pain can be caused by many things. °CAUSES  °Some of the more common causes of flank pain include: °· Muscle strains.   °· Muscle spasms.   °· A disease of your spine (vertebral disk disease).   °· A lung infection (pneumonia).   °· Fluid around your lungs (pulmonary edema).   °· A kidney infection.   °· Kidney stones.   °· A very painful skin rash caused by the chickenpox virus (shingles).   °· Gallbladder disease.   °HOME CARE INSTRUCTIONS  °Home care will depend on the cause of your pain. In general, °· Rest as directed by your caregiver. °· Drink enough fluids to keep your urine clear or pale yellow. °· Only take over-the-counter or prescription medicines as directed by your caregiver. Some medicines may help relieve the pain. °· Tell your caregiver about any changes in your pain. °· Follow up with your caregiver as directed. °SEEK IMMEDIATE MEDICAL CARE IF:  °· Your pain is not controlled with medicine.   °· You have new or worsening symptoms. °· Your pain increases.   °· You have abdominal pain.   °· You have shortness of breath.   °· You have persistent nausea or vomiting.   °· You have swelling in your abdomen.   °· You feel faint or pass out.   °· You have blood in your urine. °· You have a fever or persistent symptoms for more than 2-3 days. °· You have a fever and your symptoms suddenly get worse. °MAKE SURE YOU:  °· Understand these instructions. °· Will watch your condition. °· Will get help right away if you are not doing well or get worse. °Document Released: 06/08/2005 Document Revised: 01/10/2012 Document Reviewed: 11/30/2011 °[employer]® Patient Information ©2015 [employer]. This information is not intended to replace advice given to you by your  health care provider. Make sure you discuss any questions you have with your health care provider. ° °

## 2014-02-19 NOTE — Progress Notes (Signed)
Pre visit review using our clinic review tool, if applicable. No additional management support is needed unless otherwise documented below in the visit note. 

## 2014-02-19 NOTE — Progress Notes (Signed)
Subjective:    Patient ID: Theresa Harrington, female    DOB: 03/25/1956, 58 y.o.   MRN: [id number]  Flank Pain This is a recurrent problem. The current episode started 1 to 4 weeks ago. The problem occurs intermittently. The problem is unchanged. Pain location: both flanks. The quality of the pain is described as aching. The pain does not radiate. The pain is at a severity of 1/10. The pain is mild. The pain is worse during the day. Associated symptoms include dysuria. Pertinent negatives include no abdominal pain, bladder incontinence, bowel incontinence, chest pain, fever, headaches, leg pain, numbness, paresis, paresthesias, pelvic pain, perianal numbness, tingling, weakness or weight loss. She has tried NSAIDs (levaquin) for the symptoms. The treatment provided moderate relief.      Review of Systems  Constitutional: Positive for chills and fatigue. Negative for fever, weight loss, diaphoresis, activity change, appetite change and unexpected weight change.  HENT: Negative.   Eyes: Negative.   Respiratory: Negative.  Negative for cough, choking, chest tightness, shortness of breath and stridor.   Cardiovascular: Negative.  Negative for chest pain, palpitations and leg swelling.  Gastrointestinal: Negative.  Negative for nausea, vomiting, abdominal pain, diarrhea, constipation, blood in stool and bowel incontinence.  Endocrine: Negative.  Negative for polydipsia, polyphagia and polyuria.  Genitourinary: Positive for dysuria and flank pain. Negative for bladder incontinence, urgency, frequency, hematuria, decreased urine volume, vaginal bleeding, vaginal discharge, enuresis, difficulty urinating, genital sores, vaginal pain, menstrual problem, pelvic pain and dyspareunia.  Musculoskeletal: Negative.  Negative for arthralgias, back pain and myalgias.  Skin: Negative.   Allergic/Immunologic: Negative.   Neurological: Negative.  Negative for dizziness, tingling, syncope, speech difficulty, weakness,  light-headedness, numbness, headaches and paresthesias.  Hematological: Negative.  Negative for adenopathy. Does not bruise/bleed easily.  Psychiatric/Behavioral: Negative.        Objective:   Physical Exam  Vitals reviewed. Constitutional: She is oriented to person, place, and time. She appears well-developed and well-nourished.  Non-toxic appearance. She does not have a sickly appearance. She does not appear ill. No distress.  HENT:  Head: Normocephalic and atraumatic.  Mouth/Throat: Oropharynx is clear and moist. No oropharyngeal exudate.  Eyes: Conjunctivae are normal. Right eye exhibits no discharge. Left eye exhibits no discharge. No scleral icterus.  Neck: Normal range of motion. Neck supple. No JVD present. No tracheal deviation present. No thyromegaly present.  Cardiovascular: Normal rate, regular rhythm, normal heart sounds and intact distal pulses.  Exam reveals no gallop and no friction rub.   No murmur heard. Pulmonary/Chest: Effort normal and breath sounds normal. No stridor. No respiratory distress. She has no wheezes. She has no rales. She exhibits no tenderness.  Abdominal: Soft. Normal appearance and bowel sounds are normal. She exhibits no distension and no mass. There is no hepatosplenomegaly, splenomegaly or hepatomegaly. There is no tenderness. There is no rebound, no guarding and no CVA tenderness. No hernia. Hernia confirmed negative in the ventral area.  Musculoskeletal: Normal range of motion. She exhibits no edema and no tenderness.  Lymphadenopathy:    She has no cervical adenopathy.  Neurological: She is oriented to person, place, and time.  Skin: Skin is warm and dry. No rash noted. She is not diaphoretic. No erythema. No pallor.  Psychiatric: She has a normal mood and affect. Her behavior is normal. Judgment and thought content normal.      Lab Results  Component Value Date   WBC 8.5 10/28/2013   HGB 11.7* 10/28/2013   HCT 35.5* 10/28/2013  PLT 324.0  10/28/2013   GLUCOSE 115* 02/05/2014   CHOL 227* 02/05/2014   TRIG 140.0 02/05/2014   HDL 46.40 02/05/2014   LDLDIRECT 160.2 03/01/2010   LDLCALC 153* 02/05/2014   ALT 11 10/28/2013   AST 18 10/28/2013   NA 139 02/05/2014   K 4.0 02/05/2014   CL 103 02/05/2014   CREATININE 0.9 02/05/2014   BUN 16 02/05/2014   CO2 29 02/05/2014   TSH 0.49 02/05/2014   HGBA1C 8.0* 02/05/2014   MICROALBUR 3.5* 03/01/2010      Assessment & Plan:

## 2014-02-20 ENCOUNTER — Encounter: Payer: Self-pay | Admitting: Family

## 2014-02-20 ENCOUNTER — Ambulatory Visit (HOSPITAL_COMMUNITY)
Admission: RE | Admit: 2014-02-20 | Discharge: 2014-02-20 | Disposition: A | Discharge status: Home / Self Care | Payer: 59 | Source: Ambulatory Visit | Attending: Family | Admitting: Family

## 2014-02-20 ENCOUNTER — Ambulatory Visit (INDEPENDENT_AMBULATORY_CARE_PROVIDER_SITE_OTHER): Payer: 59 | Admitting: Family

## 2014-02-20 VITALS — BP 124/78 | HR 80 | Resp 16 | Ht 65.0 in | Wt 162.0 lb

## 2014-02-20 DIAGNOSIS — I70219 Atherosclerosis of native arteries of extremities with intermittent claudication, unspecified extremity: Secondary | ICD-10-CM

## 2014-02-20 DIAGNOSIS — I739 Peripheral vascular disease, unspecified: Secondary | ICD-10-CM | POA: Diagnosis present

## 2014-02-20 NOTE — Patient Instructions (Signed)

## 2014-02-20 NOTE — Progress Notes (Signed)
[facility] [facility] HISTORY AND PHYSICAL -PAD  History of Present Illness Theresa Harrington is a 58 y.o. female patient of Dr. Trula Slade who returns for follow up for PAD.  Fissure at the base of her left 5th toe has healed, abraded area on lateral aspect left foot has healed since UNA boot stopped.    She has not had previous vascular procedure.  She started pitavastin which does not seem to cause myalgias, is walking about 1/4 mile and develops bilateral calf pain, denies non healing wounds, relieved by 2-3 minutes rest.    She states she tried the Pletal for a couple of weeks and it did not help her claudication symptoms, she stopped taking it. She denies history of CHF.  The patient reports New Medical or Surgical History: still has a kidney stone, cannot get rid of a bladder infection, is seeing her PCP re this, also sees a urologist  Pt Diabetic: Yes, last A1C was 8.0 per patient, improved from 8.5  Pt smoker: former smoker, quit in 2003.   Pt meds include:  Statin :No  ASA: no, ran out, forgot to resume, advised to resume ASAP  Other anticoagulants/antiplatelets: no    Past Medical History  Diagnosis Date  . Hyperlipidemia   . Hypertension   . PONV (postoperative nausea and vomiting)   . Right ureteral stone   . History of kidney stones   . Type 2 diabetes mellitus   . PAD (peripheral artery disease)   . Atherosclerosis of aorta   . GERD (gastroesophageal reflux disease)   . Asymptomatic cholelithiasis   . Peripheral arterial occlusive disease     lower extremities  . Wears glasses   . Hypothyroidism     no meds    Social History History  Substance Use Topics  . Smoking status: Former Smoker -- 1.00 packs/day for 28 years    Types: Cigarettes    Quit date: 09/05/2001  . Smokeless tobacco: Never Used  . Alcohol Use: No    Family History Family History  Problem Relation Age of Onset  . Arthritis Other   . Cancer Other     colon  .  Hypertension Other   . Stroke Other   . Diabetes Father   . Heart disease Father   . Deep vein thrombosis Father   . Hyperlipidemia Father   . Diabetes Sister   . Heart disease Sister   . Deep vein thrombosis Sister   . Hyperlipidemia Sister   . Diabetes Brother   . Heart disease Brother   . Cancer Brother   . Hyperlipidemia Brother   . Colon cancer Maternal Aunt   . Diabetes Brother   . Diabetes Brother   . Kidney disease Mother   . Hyperlipidemia Mother   . Other Mother     AAA   and    Amputation    Past Surgical History  Procedure Laterality Date  . Belpharoptosis repair      eyelid lift  . Cystoscopy with stent placement Right 06/04/2013    Procedure: CYSTOSCOPY WITH STENT PLACEMENT;  Surgeon: Franchot Gallo, MD;  Location: [facility];  Service: Urology;  Laterality: Right;  . Right knee patellectomy w/ repair of extensor mechanism  04-14-2002  . Orif fifth metacarpal fx  right hand  04-21-2002  . Laparoscopic gastric banding  05-29-2005  . Revision and re-siting lap-band port  04-08-2010    W/  UPPER EGD  . Combined augmentation mammaplasty and abdominoplasty  2009    W/  BILATERAL  THIGH LIFT  . Cysto/  right ureteral stent placement  12-30-2010  . Extracorporeal shock wave lithotripsy Right 08-04-2013//   06-23-2013//   01-16-2011  . Cystoscopy with retrograde pyelogram, ureteroscopy and stent placement Right 10/07/2013    Procedure: CYSTOSCOPY WITH RETROGRADE PYELOGRAM, right URETEROSCOPY AND STENT PLACEMENT, stone extraction;  Surgeon: Arvil Persons, MD;  Location: [facility];  Service: Urology;  Laterality: Right;  . Holmium laser application Right 0/05/270    Procedure: HOLMIUM LASER APPLICATION;  Surgeon: Arvil Persons, MD;  Location: [facility];  Service: Urology;  Laterality: Right;  . Kidney stone surgery  April 2015    1-2 stones    Allergies  Allergen Reactions  . Amlodipine     edema  . Atorvastatin     muscle aches  .  Ampicillin Nausea And Vomiting  . Codeine Nausea And Vomiting  . Lisinopril Other (See Comments)     cough  . Penicillins Nausea And Vomiting  . Januvia [Sitagliptin]     nausea    Current Outpatient Prescriptions  Medication Sig Dispense Refill  . Canagliflozin (INVOKANA) 300 MG TABS Take 1 tablet (300 mg total) by mouth every morning.  30 tablet  11  . cetirizine (ZYRTEC ALLERGY) 10 MG tablet Take 10 mg by mouth daily.      . cilostazol (PLETAL) 100 MG tablet 100 mg 2 (two) times daily.       . Cyanocobalamin 500 MCG/0.1ML SOLN Place 0.1 mLs (500 mcg total) into the nose once a week.  2.3 mL  11  . ezetimibe (ZETIA) 10 MG tablet Take 1 tablet (10 mg total) by mouth daily.  90 tablet  3  . Ferrous Sulfate (IRON) 325 (65 FE) MG TABS Take by mouth daily.      [name] glyBURIDE (DIABETA) 5 MG tablet TAKE ONE TABLET BY MOUTH ONCE DAILY  90 tablet  3  . Insulin Detemir (LEVEMIR FLEXTOUCH) 100 UNIT/ML Pen Inject 30 Units into the skin daily at 10 pm. DX 250.72  15 mL  11  . Insulin Pen Needle (NOVOFINE PLUS) 32G X 4 MM MISC Use to inject once a day Dx 250.00  100 each  3  . ketoconazole (NIZORAL) 2 % cream Apply 1 application topically daily.  15 g  0  . levofloxacin (LEVAQUIN) 500 MG tablet Take 1 tablet (500 mg total) by mouth daily.  7 tablet  0  . losartan-hydrochlorothiazide (HYZAAR) 100-12.5 MG per tablet Take 1 tablet by mouth daily.  90 tablet  3  . metFORMIN (GLUCOPHAGE) 1000 MG tablet Take 1,000 mg by mouth 2 (two) times daily.      . pantoprazole (PROTONIX) 40 MG tablet Take 40 mg by mouth every morning.       . Pitavastatin Calcium (LIVALO) 2 MG TABS Take 1 tablet (2 mg total) by mouth daily.  30 tablet  11  . Umeclidinium Bromide (INCRUSE ELLIPTA) 62.5 MCG/INH AEPB Inhale 1 puff into the lungs daily.  30 each  11   No current facility-administered medications for this visit.    ROS: See HPI for pertinent positives and negatives.   Physical Examination  Filed Vitals:   02/20/14  1219  BP: 124/78  Pulse: 80  Resp: 16  Height: 5\' 5"  (1.651 m)  Weight: 162 lb (73.483 kg)  SpO2: 100%   Body mass index is 26.96 kg/(m^2).  General: A&O x 3, WDWN.  Gait: normal  Eyes: PERRLA.  Pulmonary: Respirations non labored, BBS CTA A&P.  Cardiac: regular Rythm   Carotid Bruits  Left  Right    Negative  Negative    Aorta is not palpable.  Radial pulses: 2+ palable   VASCULAR EXAM:  Extremities without ischemic changes  without Gangrene; without open wounds.   LE Pulses  LEFT  RIGHT   FEMORAL  palpable  palpable   POPLITEAL  not palpable  not palpable   POSTERIOR TIBIAL  not palpable  not palpable   DORSALIS PEDIS  ANTERIOR TIBIAL  not palpable  not palpable    Abdomen: soft, NT, no masses palpated.  Skin: no rashes, no ulcers noted.  Musculoskeletal: no muscle wasting or atrophy.  Neurologic: A&O X 3; Appropriate Affect, MOTOR FUNCTION: moving all extremities equally, motor strength 5/5 throughout. Speech is fluent/normal. CN 2-12 intact.    Non-Invasive Vascular Imaging: DATE: 02/20/2014 ABI: RIGHT 0.76 (0.60, 11/14/13), Waveforms: monophasic; absent toe pressure (0.31); LEFT 0.57 (0.63), absent toe pressure (0.32), Waveforms: monophasic   ASSESSMENT: Theresa Harrington is a 58 y.o. female who returns for follow up for PAD.  Fissure at the base of her left 5th toe has healed, abraded area on lateral aspect left foot has healed since UNA boot stopped.    She has not had previous vascular procedure.  She started pitavastin which does not seem to cause myalgias, is walking about 1/4 mile and develops bilateral calf pain, denies non healing wounds, relieved by 2-3 minutes rest.    She states she tried the Pletal for a couple of weeks and it did not help her claudication symptoms, she stopped taking it. Atherosclerotic risk factors: Her DM glycemic control has improved but still uncontrolled, fortunately she stopped smoking in 2003. Right LE ABI has improved to evidence  of moderate arterial occlusive disease, left ABI remains stable at evidence of severe arterial occlusive disease, toe pressures are not attainable, no tissue loss. Extensive amount of time was spent with the patient discussing her atherosclerotic risk factors and that she needs conservative treatment which includes maximal medical management and graduated walking program. The above was discussed with Dr. Bridgett Larsson, see Plan.  PLAN:  Graduated walking program and maximal medical management of DM, etc. Patient advised to work closely with her medical provider that helps her manage her DM to improve her glycemic control as well as possible.  Resume daily enteric coated 325 mg aspirin taken with a meal.  I discussed in depth with the patient the nature of atherosclerosis, and emphasized the importance of maximal medical management including strict control of blood pressure, blood glucose, and lipid levels, obtaining regular exercise, and continued cessation of smoking.  The patient is aware that without maximal medical management the underlying atherosclerotic disease process will progress, limiting the benefit of any interventions.  Based on the patient's vascular studies and examination, and after discussing with Dr. Bridgett Larsson, pt will return to clinic in 3 months with ABI's.  The patient was given information about PAD including signs, symptoms, treatment, what symptoms should prompt the patient to seek immediate medical care, and risk reduction measures to take.  Clemon Chambers, RN, MSN, FNP-C [facility] Phone: [phone]  Clinic MD: Bridgett Larsson  02/20/2014 12:38 PM

## 2014-02-22 NOTE — Assessment & Plan Note (Signed)
Her BP is adequately well controlled Lytes and renal function are stable

## 2014-02-22 NOTE — Assessment & Plan Note (Signed)
Her UA shows RBC's and WBC's but the culture was negative and she has taken levaquin so I don;t think this is an infection She has a history of very large renal stones so I will get a renal U/S done and have asked her follow up with urology

## 2014-02-22 NOTE — Assessment & Plan Note (Signed)
She has not achieved her LDL goal Will add zetia

## 2014-02-23 ENCOUNTER — Telehealth: Payer: Self-pay | Admitting: Internal Medicine

## 2014-02-23 ENCOUNTER — Telehealth: Payer: Self-pay | Admitting: *Deleted

## 2014-02-23 NOTE — Telephone Encounter (Signed)
Why?  

## 2014-02-23 NOTE — Telephone Encounter (Signed)
Left msg on triage requesting referral to [facility] in [facility]. # [id number]...Theresa Harrington

## 2014-02-23 NOTE — Telephone Encounter (Signed)
error 

## 2014-02-23 NOTE — Telephone Encounter (Signed)
Pt states she is not satisfied with vascular & vein. She had an appt with them on Friday they told her that they couldn't detect any circulation problem. She is wanting to get a 2nd opinion...Theresa Harrington

## 2014-02-24 ENCOUNTER — Other Ambulatory Visit: Payer: Self-pay | Admitting: Internal Medicine

## 2014-02-24 DIAGNOSIS — I7025 Atherosclerosis of native arteries of other extremities with ulceration: Secondary | ICD-10-CM

## 2014-02-24 NOTE — Telephone Encounter (Signed)
Notified pt referral has been place...lmb 

## 2014-02-24 NOTE — Telephone Encounter (Signed)
done

## 2014-03-02 ENCOUNTER — Telehealth: Payer: Self-pay | Admitting: Internal Medicine

## 2014-03-02 ENCOUNTER — Telehealth: Payer: Self-pay

## 2014-03-02 NOTE — Telephone Encounter (Signed)
Phone call from pt.  Reported she noticed a discolored area on left great toe this morning that looks "like a blood blister".  Reported it is slightly raised and is approx. size of 1/2 an eraser on a pencil.  Voiced concern about being told "she had no flow in her toes" @ recent appt. 10/23, and is requesting to have this area checked.  Pt. unable to recall any injury to the toe.  Denies any tenderness, breakdown in the skin, or drainage @ site of blood blister.  Advised will discuss with Nurse Practitioner, and return call re: recommendations.  Agrees.

## 2014-03-02 NOTE — Telephone Encounter (Signed)
Pt requesting work in appt with Dr [name] asap. Newly developed "spot" on toe. Requests Dr [name] only.

## 2014-03-03 ENCOUNTER — Ambulatory Visit
Admission: RE | Admit: 2014-03-03 | Discharge: 2014-03-03 | Disposition: A | Discharge status: Home / Self Care | Payer: 59 | Source: Ambulatory Visit | Attending: Internal Medicine | Admitting: Internal Medicine

## 2014-03-03 DIAGNOSIS — R109 Unspecified abdominal pain: Secondary | ICD-10-CM

## 2014-03-03 DIAGNOSIS — N201 Calculus of ureter: Secondary | ICD-10-CM

## 2014-03-03 DIAGNOSIS — R300 Dysuria: Secondary | ICD-10-CM

## 2014-03-03 NOTE — Telephone Encounter (Signed)
Theresa Harrington, Please place her on my schedule for this Monday when Dr. Trula Slade is here, no vascular lab testing necessary since she just had this on 02/20/14.  Thank you, Vinnie Level

## 2014-03-04 NOTE — Telephone Encounter (Deleted)
Sharmon Leyden Nickel, NP at 03/03/2014 5:31 PM     Status: Signed       Expand All Collapse All   Arbie Cookey, Please place her on my schedule for this Monday when Dr. Trula Slade is here, no vascular lab testing necessary since she just had this on 02/20/14.  Thank you, Vinnie Level

## 2014-03-06 ENCOUNTER — Encounter: Payer: Self-pay | Admitting: Family

## 2014-03-09 ENCOUNTER — Ambulatory Visit (INDEPENDENT_AMBULATORY_CARE_PROVIDER_SITE_OTHER): Payer: 59 | Admitting: Family

## 2014-03-09 ENCOUNTER — Encounter: Payer: Self-pay | Admitting: Family

## 2014-03-09 VITALS — BP 158/75 | HR 60 | Resp 16 | Ht 65.0 in | Wt 169.0 lb

## 2014-03-09 DIAGNOSIS — S90425A Blister (nonthermal), left lesser toe(s), initial encounter: Secondary | ICD-10-CM

## 2014-03-09 NOTE — Progress Notes (Signed)
[facility] [place] HISTORY AND PHYSICAL -PAD  History of Present Illness Theresa Harrington is a 59 y.o. female  patient of Dr. Trula Slade who has a history of PAD.  She returns today for evaluation of her left great toe discoloration at the base of her nail bed that she noticed after a 60 pound dog stepped on her foot, states she might have been wearing sandals or sneakers at that time which was about 1-2 weeks ago, this is not painful, no swelling or drainage. She is walking 3 miles daily, stopping a couple of times.  Fissure at the base of her left 5th toe has healed, abraded area on lateral aspect left foot has healed since UNA boot stopped.   She has not had a previous vascular procedure.  She started pitavastin which does not seem to cause myalgias, is walking about 1/4 mile and develops bilateral calf pain, denies non healing wounds, relieved by 2-3 minutes rest.   She states she tried the Pletal for a couple of weeks and it did not help her claudication symptoms, she stopped taking it. She denies history of CHF.  The patient reports New Medical or Surgical History: still has a kidney stone, cannot get rid of a bladder infection, is seeing her PCP re this, also sees a urologist  Pt Diabetic: Yes, last A1C was 8.0 per patient, improved from 8.5  Pt smoker: former smoker, quit in 2003.   Pt meds include:  Statin :No  ASA: no, ran out, forgot to resume, advised to resume ASAP  Other anticoagulants/antiplatelets: no     Past Medical History  Diagnosis Date  . Hyperlipidemia   . Hypertension   . PONV (postoperative nausea and vomiting)   . Right ureteral stone   . History of kidney stones   . Type 2 diabetes mellitus   . PAD (peripheral artery disease)   . Atherosclerosis of aorta   . GERD (gastroesophageal reflux disease)   . Asymptomatic cholelithiasis   . Peripheral arterial occlusive disease     lower extremities  . Wears glasses   .  Hypothyroidism     no meds    Social History History  Substance Use Topics  . Smoking status: Former Smoker -- 1.00 packs/day for 28 years    Types: Cigarettes    Quit date: 09/05/2001  . Smokeless tobacco: Never Used  . Alcohol Use: No    Family History Family History  Problem Relation Age of Onset  . Arthritis Other   . Cancer Other     colon  . Hypertension Other   . Stroke Other   . Diabetes Father   . Heart disease Father   . Deep vein thrombosis Father   . Hyperlipidemia Father   . Diabetes Sister   . Heart disease Sister   . Deep vein thrombosis Sister   . Hyperlipidemia Sister   . Diabetes Brother   . Heart disease Brother   . Cancer Brother   . Hyperlipidemia Brother   . Colon cancer Maternal Aunt   . Diabetes Brother   . Diabetes Brother   . Kidney disease Mother   . Hyperlipidemia Mother   . Other Mother     AAA   and    Amputation    Past Surgical History  Procedure Laterality Date  . Belpharoptosis repair      eyelid lift  . Cystoscopy with stent placement Right 06/04/2013    Procedure: CYSTOSCOPY WITH STENT PLACEMENT;  Surgeon: Franchot Gallo, MD;  Location: [facility];  Service: Urology;  Laterality: Right;  . Right knee patellectomy w/ repair of extensor mechanism  04-14-2002  . Orif fifth metacarpal fx  right hand  04-21-2002  . Laparoscopic gastric banding  05-29-2005  . Revision and re-siting lap-band port  04-08-2010    W/  UPPER EGD  . Combined augmentation mammaplasty and abdominoplasty  2009    W/  BILATERAL  THIGH LIFT  . Cysto/  right ureteral stent placement  12-30-2010  . Extracorporeal shock wave lithotripsy Right 08-04-2013//   06-23-2013//   01-16-2011  . Cystoscopy with retrograde pyelogram, ureteroscopy and stent placement Right 10/07/2013    Procedure: CYSTOSCOPY WITH RETROGRADE PYELOGRAM, right URETEROSCOPY AND STENT PLACEMENT, stone extraction;  Surgeon: Arvil Persons, MD;  Location: [facility];  Service: Urology;   Laterality: Right;  . Holmium laser application Right 07/08/7671    Procedure: HOLMIUM LASER APPLICATION;  Surgeon: Arvil Persons, MD;  Location: [facility];  Service: Urology;  Laterality: Right;  . Kidney stone surgery  April 2015    1-2 stones    Allergies  Allergen Reactions  . Amlodipine     edema  . Atorvastatin     muscle aches  . Ampicillin Nausea And Vomiting  . Codeine Nausea And Vomiting  . Lisinopril Other (See Comments)     cough  . Penicillins Nausea And Vomiting  . Januvia [Sitagliptin]     nausea    Current Outpatient Prescriptions  Medication Sig Dispense Refill  . Canagliflozin (INVOKANA) 300 MG TABS Take 1 tablet (300 mg total) by mouth every morning. 30 tablet 11  . cetirizine (ZYRTEC ALLERGY) 10 MG tablet Take 10 mg by mouth daily.    . cilostazol (PLETAL) 100 MG tablet 100 mg 2 (two) times daily.     . Cyanocobalamin 500 MCG/0.1ML SOLN Place 0.1 mLs (500 mcg total) into the nose once a week. 2.3 mL 11  . ezetimibe (ZETIA) 10 MG tablet Take 1 tablet (10 mg total) by mouth daily. 90 tablet 3  . Ferrous Sulfate (IRON) 325 (65 FE) MG TABS Take by mouth daily.    [name] glyBURIDE (DIABETA) 5 MG tablet TAKE ONE TABLET BY MOUTH ONCE DAILY 90 tablet 3  . Insulin Detemir (LEVEMIR FLEXTOUCH) 100 UNIT/ML Pen Inject 30 Units into the skin daily at 10 pm. DX 250.72 15 mL 11  . Insulin Pen Needle (NOVOFINE PLUS) 32G X 4 MM MISC Use to inject once a day Dx 250.00 100 each 3  . ketoconazole (NIZORAL) 2 % cream Apply 1 application topically daily. 15 g 0  . levofloxacin (LEVAQUIN) 500 MG tablet Take 1 tablet (500 mg total) by mouth daily. 7 tablet 0  . losartan-hydrochlorothiazide (HYZAAR) 100-12.5 MG per tablet Take 1 tablet by mouth daily. 90 tablet 3  . metFORMIN (GLUCOPHAGE) 1000 MG tablet Take 1,000 mg by mouth 2 (two) times daily.    . pantoprazole (PROTONIX) 40 MG tablet Take 40 mg by mouth every morning.     . Pitavastatin Calcium (LIVALO) 2 MG TABS Take 1  tablet (2 mg total) by mouth daily. 30 tablet 11  . Umeclidinium Bromide (INCRUSE ELLIPTA) 62.5 MCG/INH AEPB Inhale 1 puff into the lungs daily. 30 each 11   No current facility-administered medications for this visit.    ROS: See HPI for pertinent positives and negatives.   Physical Examination  Filed Vitals:   03/09/14 1353  BP: 158/75  Pulse:  60  Resp: 16  Height: 5\' 5"  (1.651 m)  Weight: 169 lb (76.658 kg)  SpO2: 100%   Body mass index is 28.12 kg/(m^2).  General: A&O x 3, WDWN.  Gait: normal  Eyes: PERRLA.  Pulmonary: Respirations non labored.  Cardiac: regular Rythm   Carotid Bruits  Left  Right    Negative  Negative    Aorta is not palpable.  Radial pulses: 2+ palable   VASCULAR EXAM:  Extremities without ischemic changes  without Gangrene; without open wounds. Small hematoma (about 0.25 cm x 0.20 cm) at the base of left great toenail bed, toenail is surgically absent.  LE Pulses  LEFT  RIGHT   FEMORAL  palpable  palpable   POPLITEAL  not palpable  not palpable   POSTERIOR TIBIAL  not palpable  not palpable   DORSALIS PEDIS  ANTERIOR TIBIAL  faintly palpable  faintly palpable    Abdomen: soft, NT, no masses palpated.  Skin: no rashes, no ulcers noted.  Musculoskeletal: no muscle wasting or atrophy.  Neurologic: A&O X 3; Appropriate Affect, MOTOR FUNCTION: moving all extremities equally, motor strength 5/5 throughout. Speech is fluent/normal. CN 2-12 intact.   ASSESSMENT: Theresa Harrington is a 58 y.o. female with a known history of PAD who presents with small hematoma at the base of her left great toenail empty toenail bed after a large dog stepped on her left foot. No signs of ischemia in either LE. Both DP pulses are faintly palpable since her last visit on 02/21/14 at which time her pedal pulses were not palpable. She was congratulated on walking 3 miles daily and encouraged to continue this.  PLAN:  Continued graduated  walking program and maximal medical management. Call our office if this small hematoma does not improve in a few weeks. I discussed in depth with the patient the nature of atherosclerosis, and emphasized the importance of maximal medical management including strict control of blood pressure, blood glucose, and lipid levels, obtaining regular exercise, and continued cessation of smoking.  The patient is aware that without maximal medical management the underlying atherosclerotic disease process will progress, limiting the benefit of any interventions.  Based on the patient's vascular studies and examination, pt will return to clinic as scheduled in about 3 months with ABI's.  The patient was given information about PAD including signs, symptoms, treatment, what symptoms should prompt the patient to seek immediate medical care, and risk reduction measures to take.  Clemon Chambers, RN, MSN, FNP-C [facility] Phone: [phone]  Clinic MD: Trula Slade  03/09/2014 1:53 PM

## 2014-03-09 NOTE — Patient Instructions (Signed)

## 2014-03-11 ENCOUNTER — Ambulatory Visit (INDEPENDENT_AMBULATORY_CARE_PROVIDER_SITE_OTHER): Payer: 59

## 2014-03-11 DIAGNOSIS — E538 Deficiency of other specified B group vitamins: Secondary | ICD-10-CM

## 2014-03-11 MED ORDER — CYANOCOBALAMIN 1000 MCG/ML IJ SOLN
1000.0000 ug | Freq: Once | INTRAMUSCULAR | Status: AC
  Administered 2014-03-11: 1000 ug via INTRAMUSCULAR

## 2014-04-01 ENCOUNTER — Ambulatory Visit (INDEPENDENT_AMBULATORY_CARE_PROVIDER_SITE_OTHER): Payer: 59 | Admitting: Internal Medicine

## 2014-04-01 ENCOUNTER — Other Ambulatory Visit (INDEPENDENT_AMBULATORY_CARE_PROVIDER_SITE_OTHER): Payer: 59

## 2014-04-01 ENCOUNTER — Encounter: Payer: Self-pay | Admitting: Internal Medicine

## 2014-04-01 VITALS — BP 104/70 | HR 67 | Temp 97.9°F | Resp 16 | Ht 65.5 in | Wt 167.0 lb

## 2014-04-01 DIAGNOSIS — L98499 Non-pressure chronic ulcer of skin of other sites with unspecified severity: Secondary | ICD-10-CM

## 2014-04-01 DIAGNOSIS — E1151 Type 2 diabetes mellitus with diabetic peripheral angiopathy without gangrene: Secondary | ICD-10-CM

## 2014-04-01 DIAGNOSIS — G4762 Sleep related leg cramps: Secondary | ICD-10-CM | POA: Insufficient documentation

## 2014-04-01 DIAGNOSIS — E538 Deficiency of other specified B group vitamins: Secondary | ICD-10-CM

## 2014-04-01 DIAGNOSIS — I7025 Atherosclerosis of native arteries of other extremities with ulceration: Secondary | ICD-10-CM

## 2014-04-01 DIAGNOSIS — I739 Peripheral vascular disease, unspecified: Secondary | ICD-10-CM

## 2014-04-01 MED ORDER — CYANOCOBALAMIN 1000 MCG/ML IJ SOLN
1000.0000 ug | Freq: Once | INTRAMUSCULAR | Status: AC
  Administered 2014-04-01: 1000 ug via INTRAMUSCULAR

## 2014-04-01 MED ORDER — PREGABALIN 50 MG PO CAPS: 50.0000 mg | ORAL_CAPSULE | Freq: Three times a day (TID) | ORAL | Status: DC

## 2014-04-01 NOTE — Progress Notes (Signed)
Pre visit review using our clinic review tool, if applicable. No additional management support is needed unless otherwise documented below in the visit note. 

## 2014-04-01 NOTE — Patient Instructions (Signed)
Leg Cramps Leg cramps that occur during exercise can be caused by poor circulation or dehydration. However, muscle cramps that occur at rest or during the night are usually not due to any serious medical problem. Heat cramps may cause muscle spasms during hot weather.  CAUSES There is no clear cause for muscle cramps. However, dehydration may be a factor for those who do not drink enough fluids and those who exercise in the heat. Imbalances in the level of sodium, potassium, calcium or magnesium in the muscle tissue may also be a factor. Some medications, such as water pills (diuretics), may cause loss of chemicals that the body needs (like sodium and potassium) and cause muscle cramps. TREATMENT   Make sure your diet has enough fluids and essential minerals for the muscle to work normally.  Avoid strenuous exercise for several days if you have been having frequent leg cramps.  Stretch and massage the cramped muscle for several minutes.  Some medicines may be helpful in some patients with night cramps. Only take over-the-counter or prescription medicines as directed by your caregiver. SEEK IMMEDIATE MEDICAL CARE IF:   Your leg cramps become worse.  Your foot becomes cold, numb, or blue. Document Released: 05/25/2004 Document Revised: 07/10/2011 Document Reviewed: 05/12/2008 Exit[employer] Patient Information 2015 [employer]. This information is not intended to replace advice given to you by your health care provider. Make sure you discuss any questions you have with your health care provider.  

## 2014-04-02 LAB — COMPREHENSIVE METABOLIC PANEL
ALK PHOS: 50 U/L (ref 39–117)
ALT: 17 U/L (ref 0–35)
AST: 20 U/L (ref 0–37)
Albumin: 3.9 g/dL (ref 3.5–5.2)
BUN: 11 mg/dL (ref 6–23)
CALCIUM: 9.3 mg/dL (ref 8.4–10.5)
CHLORIDE: 108 meq/L (ref 96–112)
CO2: 23 mEq/L (ref 19–32)
CREATININE: 0.9 mg/dL (ref 0.4–1.2)
GFR: 65.21 mL/min (ref >60.00)
Glucose, Bld: 123 mg/dL — ABNORMAL HIGH (ref 70–99)
Potassium: 3.6 mEq/L (ref 3.5–5.1)
Sodium: 137 mEq/L (ref 135–145)
Total Bilirubin: 0.5 mg/dL (ref 0.2–1.2)
Total Protein: 6.6 g/dL (ref 6.0–8.3)

## 2014-04-02 LAB — CK: CK TOTAL: 66 U/L (ref 7–177)

## 2014-04-02 LAB — MAGNESIUM: Magnesium: 1.5 mg/dL (ref 1.5–2.5)

## 2014-04-02 MED ORDER — MAGNESIUM OXIDE 400 MG PO CAPS: 1.0000 | ORAL_CAPSULE | Freq: Every day | ORAL | Status: DC

## 2014-04-02 NOTE — Assessment & Plan Note (Signed)
Lytes show a low/normal Mg++ level - will start mag oxide Will also try lyrica to relieve the pain

## 2014-04-02 NOTE — Progress Notes (Signed)
   Subjective:    Patient ID: Theresa Harrington, female    DOB: 07-13-1955, 58 y.o.   MRN: [id number]  HPI  She complains of leg cramps for 2 weeks, she describes a burning/stinging pain over the front of her shins from the knees to the ankles. The pain keeps her awake at night.  Review of Systems  Constitutional: Negative.  Negative for fever, chills, diaphoresis, appetite change and fatigue.  HENT: Negative.   Eyes: Negative.   Respiratory: Negative.  Negative for cough, choking, chest tightness, shortness of breath and stridor.   Cardiovascular: Negative.  Negative for chest pain, palpitations and leg swelling.  Gastrointestinal: Negative.  Negative for nausea, vomiting, abdominal pain, diarrhea, constipation and blood in stool.  Endocrine: Negative.   Genitourinary: Negative.   Musculoskeletal: Positive for myalgias. Negative for back pain and arthralgias.  Skin: Negative.   Allergic/Immunologic: Negative.   Neurological: Negative.  Negative for dizziness, tremors, weakness and numbness.  Hematological: Negative.  Negative for adenopathy. Does not bruise/bleed easily.  Psychiatric/Behavioral: Negative.        Objective:   Physical Exam  Constitutional: She is oriented to person, place, and time. She appears well-developed and well-nourished. No distress.  HENT:  Head: Normocephalic and atraumatic.  Mouth/Throat: Oropharynx is clear and moist. No oropharyngeal exudate.  Eyes: Conjunctivae are normal. Right eye exhibits no discharge. Left eye exhibits no discharge. No scleral icterus.  Neck: Normal range of motion. Neck supple. No JVD present. No tracheal deviation present. No thyromegaly present.  Cardiovascular: Normal rate, regular rhythm, normal heart sounds and intact distal pulses.  Exam reveals no gallop and no friction rub.   No murmur heard. Pulmonary/Chest: Effort normal and breath sounds normal. No stridor. No respiratory distress. She has no wheezes. She has no rales. She  exhibits no tenderness.  Abdominal: Soft. Bowel sounds are normal. She exhibits no distension and no mass. There is no tenderness. There is no rebound and no guarding.  Musculoskeletal: Normal range of motion. She exhibits no edema or tenderness.  Lymphadenopathy:    She has no cervical adenopathy.  Neurological: She is oriented to person, place, and time.  Skin: Skin is warm and dry. No rash noted. She is not diaphoretic. No erythema. No pallor.  Psychiatric: She has a normal mood and affect. Her behavior is normal. Judgment and thought content normal.  Vitals reviewed.   Lab Results  Component Value Date   WBC 8.5 10/28/2013   HGB 11.7* 10/28/2013   HCT 35.5* 10/28/2013   PLT 324.0 10/28/2013   GLUCOSE 123* 04/01/2014   CHOL 227* 02/05/2014   TRIG 140.0 02/05/2014   HDL 46.40 02/05/2014   LDLDIRECT 160.2 03/01/2010   LDLCALC 153* 02/05/2014   ALT 17 04/01/2014   AST 20 04/01/2014   NA 137 04/01/2014   K 3.6 04/01/2014   CL 108 04/01/2014   CREATININE 0.9 04/01/2014   BUN 11 04/01/2014   CO2 23 04/01/2014   TSH 0.49 02/05/2014   HGBA1C 8.0* 02/05/2014   MICROALBUR 3.5* 03/01/2010        Assessment & Plan:

## 2014-04-02 NOTE — Assessment & Plan Note (Signed)
She will meet a new vascular surgeon next week

## 2014-04-28 ENCOUNTER — Encounter: Payer: Self-pay | Admitting: Internal Medicine

## 2014-04-28 ENCOUNTER — Other Ambulatory Visit (INDEPENDENT_AMBULATORY_CARE_PROVIDER_SITE_OTHER): Payer: 59

## 2014-04-28 ENCOUNTER — Ambulatory Visit (INDEPENDENT_AMBULATORY_CARE_PROVIDER_SITE_OTHER): Payer: 59 | Admitting: Internal Medicine

## 2014-04-28 VITALS — BP 130/70 | HR 60 | Temp 97.5°F | Resp 20 | Ht 65.5 in | Wt 172.0 lb

## 2014-04-28 DIAGNOSIS — M545 Low back pain, unspecified: Secondary | ICD-10-CM

## 2014-04-28 DIAGNOSIS — J069 Acute upper respiratory infection, unspecified: Secondary | ICD-10-CM

## 2014-04-28 DIAGNOSIS — E1151 Type 2 diabetes mellitus with diabetic peripheral angiopathy without gangrene: Secondary | ICD-10-CM

## 2014-04-28 DIAGNOSIS — G4762 Sleep related leg cramps: Secondary | ICD-10-CM

## 2014-04-28 DIAGNOSIS — K8020 Calculus of gallbladder without cholecystitis without obstruction: Secondary | ICD-10-CM

## 2014-04-28 DIAGNOSIS — I10 Essential (primary) hypertension: Secondary | ICD-10-CM

## 2014-04-28 DIAGNOSIS — E538 Deficiency of other specified B group vitamins: Secondary | ICD-10-CM

## 2014-04-28 LAB — BASIC METABOLIC PANEL
BUN: 18 mg/dL (ref 6–23)
CO2: 29 meq/L (ref 19–32)
Calcium: 10.1 mg/dL (ref 8.4–10.5)
Chloride: 106 mEq/L (ref 96–112)
Creatinine, Ser: 0.8 mg/dL (ref 0.4–1.2)
GFR: 82.07 mL/min (ref >60.00)
GLUCOSE: 130 mg/dL — AB (ref 70–99)
Potassium: 4.2 mEq/L (ref 3.5–5.1)
Sodium: 142 mEq/L (ref 135–145)

## 2014-04-28 LAB — HEMOGLOBIN A1C: HEMOGLOBIN A1C: 7.9 % — AB (ref 4.6–6.5)

## 2014-04-28 MED ORDER — PROMETHAZINE-DM 6.25-15 MG/5ML PO SYRP
5.0000 mL | ORAL_SOLUTION | Freq: Four times a day (QID) | ORAL | Status: DC | PRN
Start: 2014-04-28 — End: 2014-04-28

## 2014-04-28 MED ORDER — CYANOCOBALAMIN 1000 MCG/ML IJ SOLN
1000.0000 ug | Freq: Once | INTRAMUSCULAR | Status: AC
  Administered 2014-04-28: 1000 ug via INTRAMUSCULAR

## 2014-04-28 MED ORDER — GABAPENTIN 300 MG PO CAPS: 300.0000 mg | ORAL_CAPSULE | Freq: Three times a day (TID) | ORAL | Status: DC

## 2014-04-28 MED ORDER — HYDROCOD POLST-CHLORPHEN POLST 10-8 MG/5ML PO LQCR: 5.0000 mL | Freq: Two times a day (BID) | ORAL | Status: DC | PRN

## 2014-04-28 NOTE — Progress Notes (Signed)
Subjective:    Patient ID: Theresa Harrington, female    DOB: 01-Jan-1956, 58 y.o.   MRN: [id number]  Cough This is a new problem. The current episode started in the past 7 days. The problem has been unchanged. The cough is non-productive. Associated symptoms include rhinorrhea and a sore throat. Pertinent negatives include no chest pain, chills, ear congestion, ear pain, fever, headaches, heartburn, hemoptysis, myalgias, nasal congestion, postnasal drip, rash, shortness of breath, sweats, weight loss or wheezing. Risk factors for lung disease include smoking/tobacco exposure. She has tried OTC cough suppressant for the symptoms. The treatment provided mild relief. There is no history of asthma, bronchiectasis, bronchitis, COPD, emphysema, environmental allergies or pneumonia.      Review of Systems  Constitutional: Negative.  Negative for fever, chills, weight loss, diaphoresis, appetite change and fatigue.  HENT: Positive for rhinorrhea and sore throat. Negative for congestion, ear pain, postnasal drip, sinus pressure, tinnitus and trouble swallowing.   Eyes: Negative.   Respiratory: Positive for cough. Negative for apnea, hemoptysis, choking, chest tightness, shortness of breath, wheezing and stridor.   Cardiovascular: Negative.  Negative for chest pain, palpitations and leg swelling.  Gastrointestinal: Negative.  Negative for heartburn, nausea, abdominal pain, diarrhea, constipation and blood in stool.  Endocrine: Negative.   Genitourinary: Negative.   Musculoskeletal: Negative.  Negative for myalgias.  Skin: Negative.  Negative for rash.  Allergic/Immunologic: Negative.  Negative for environmental allergies.  Neurological: Negative.  Negative for headaches.  Hematological: Negative.  Negative for adenopathy. Does not bruise/bleed easily.  Psychiatric/Behavioral: Negative.        Objective:   Physical Exam  Constitutional: She is oriented to person, place, and time. She appears  well-developed and well-nourished.  Non-toxic appearance. She does not have a sickly appearance. She does not appear ill. No distress.  HENT:  Head: Normocephalic and atraumatic.  Mouth/Throat: Oropharynx is clear and moist. No oropharyngeal exudate.  Eyes: Conjunctivae are normal. Right eye exhibits no discharge. Left eye exhibits no discharge. No scleral icterus.  Neck: Normal range of motion. Neck supple. No JVD present. No tracheal deviation present. No thyromegaly present.  Cardiovascular: Normal rate, regular rhythm, normal heart sounds and intact distal pulses.  Exam reveals no gallop and no friction rub.   No murmur heard. Pulmonary/Chest: Effort normal and breath sounds normal. No accessory muscle usage or stridor. No respiratory distress. She has no decreased breath sounds. She has no wheezes. She has no rhonchi. She has no rales. She exhibits no tenderness.  Abdominal: Soft. Bowel sounds are normal. She exhibits no distension and no mass. There is no tenderness. There is no rebound and no guarding.  Musculoskeletal: Normal range of motion. She exhibits no edema or tenderness.  Lymphadenopathy:    She has no cervical adenopathy.  Neurological: She is oriented to person, place, and time.  Skin: Skin is warm and dry. No rash noted. She is not diaphoretic. No erythema. No pallor.  Vitals reviewed.     Lab Results  Component Value Date   WBC 8.5 10/28/2013   HGB 11.7* 10/28/2013   HCT 35.5* 10/28/2013   PLT 324.0 10/28/2013   GLUCOSE 123* 04/01/2014   CHOL 227* 02/05/2014   TRIG 140.0 02/05/2014   HDL 46.40 02/05/2014   LDLDIRECT 160.2 03/01/2010   LDLCALC 153* 02/05/2014   ALT 17 04/01/2014   AST 20 04/01/2014   NA 137 04/01/2014   K 3.6 04/01/2014   CL 108 04/01/2014   CREATININE 0.9 04/01/2014   BUN  11 04/01/2014   CO2 23 04/01/2014   TSH 0.49 02/05/2014   HGBA1C 8.0* 02/05/2014   MICROALBUR 3.5* 03/01/2010      Assessment & Plan:

## 2014-04-28 NOTE — Patient Instructions (Signed)
Cough, Adult  A cough is a reflex that helps clear your throat and airways. It can help heal the body or may be a reaction to an irritated airway. A cough may only last 2 or 3 weeks (acute) or may last more than 8 weeks (chronic).  CAUSES Acute cough:  Viral or bacterial infections. Chronic cough:  Infections.  Allergies.  Asthma.  Post-nasal drip.  Smoking.  Heartburn or acid reflux.  Some medicines.  Chronic lung problems (COPD).  Cancer. SYMPTOMS   Cough.  Fever.  Chest pain.  Increased breathing rate.  High-pitched whistling sound when breathing (wheezing).  Colored mucus that you cough up (sputum). TREATMENT   A bacterial cough may be treated with antibiotic medicine.  A viral cough must run its course and will not respond to antibiotics.  Your caregiver may recommend other treatments if you have a chronic cough. HOME CARE INSTRUCTIONS   Only take over-the-counter or prescription medicines for pain, discomfort, or fever as directed by your caregiver. Use cough suppressants only as directed by your caregiver.  Use a cold steam vaporizer or humidifier in your bedroom or home to help loosen secretions.  Sleep in a semi-upright position if your cough is worse at night.  Rest as needed.  Stop smoking if you smoke. SEEK IMMEDIATE MEDICAL CARE IF:   You have pus in your sputum.  Your cough starts to worsen.  You cannot control your cough with suppressants and are losing sleep.  You begin coughing up blood.  You have difficulty breathing.  You develop pain which is getting worse or is uncontrolled with medicine.  You have a fever. MAKE SURE YOU:   Understand these instructions.  Will watch your condition.  Will get help right away if you are not doing well or get worse. Document Released: 10/14/2010 Document Revised: 07/10/2011 Document Reviewed: 10/14/2010 Exit[employer] Patient Information 2015 [employer]. This information is not intended  to replace advice given to you by your health care provider. Make sure you discuss any questions you have with your health care provider.  

## 2014-05-03 NOTE — Assessment & Plan Note (Signed)
Her BP is well controlled 

## 2014-05-03 NOTE — Assessment & Plan Note (Signed)
She has a viral URI Will treat with tussionex for the URI symptoms

## 2014-05-03 NOTE — Assessment & Plan Note (Signed)
Will refer to surgery to see if she needs to have these removed

## 2014-05-21 ENCOUNTER — Encounter: Payer: Self-pay | Admitting: Family

## 2014-05-21 HISTORY — PX: OTHER SURGICAL HISTORY: SHX169

## 2014-05-25 ENCOUNTER — Ambulatory Visit (INDEPENDENT_AMBULATORY_CARE_PROVIDER_SITE_OTHER): Payer: 59 | Admitting: Family

## 2014-05-25 ENCOUNTER — Ambulatory Visit (HOSPITAL_COMMUNITY)
Admission: RE | Admit: 2014-05-25 | Discharge: 2014-05-25 | Disposition: A | Discharge status: Home / Self Care | Payer: 59 | Source: Ambulatory Visit | Attending: Family | Admitting: Family

## 2014-05-25 ENCOUNTER — Encounter: Payer: Self-pay | Admitting: Family

## 2014-05-25 VITALS — BP 121/73 | HR 71 | Resp 16 | Ht 65.0 in | Wt 168.0 lb

## 2014-05-25 DIAGNOSIS — M79675 Pain in left toe(s): Secondary | ICD-10-CM

## 2014-05-25 DIAGNOSIS — M7989 Other specified soft tissue disorders: Secondary | ICD-10-CM | POA: Diagnosis not present

## 2014-05-25 DIAGNOSIS — I739 Peripheral vascular disease, unspecified: Secondary | ICD-10-CM | POA: Insufficient documentation

## 2014-05-25 DIAGNOSIS — M79672 Pain in left foot: Secondary | ICD-10-CM

## 2014-05-25 DIAGNOSIS — I70219 Atherosclerosis of native arteries of extremities with intermittent claudication, unspecified extremity: Secondary | ICD-10-CM

## 2014-05-25 NOTE — Progress Notes (Signed)
[facility] [place] HISTORY AND PHYSICAL -PAD  History of Present Illness Theresa Harrington is a 59 y.o. female patient of Dr. Trula Slade who has a history of PAD.  She returns today for scheduled follow up and at referral of her podiatrist re healing potential of her left second toe after toenail was surgically removed  Had left 2nd toenail removed on 05/21/14 after a possible jamming of this toe and the toenail became loose, podiatrist is J. Harlow Mares. She is taking an antibx, Macrodantin, for a bladder infection, was told that she does not need an antibx for her foot.  She was walking 3 miles daily, stopping a couple of times.  Fissure at the base of her left 5th toe has healed, abraded area on lateral aspect left foot has healed since UNA boot stopped.   She has not had a previous vascular procedure.  She started pitavastin which does not seem to cause myalgias, is walking about 1/4 mile and develops bilateral calf pain, denies non healing wounds, relieved by 2-3 minutes rest.   She states she tried the Pletal for a couple of weeks and it did not help her claudication symptoms, she stopped taking it. She denies history of CHF.  The patient reports New Medical or Surgical History: still has a kidney stone, cannot get rid of a bladder infection, is seeing her PCP re this, also sees a urologist  Pt Diabetic: Yes, last A1C was 7.9 in December 2015 (review of records) Pt smoker: former smoker, quit in 2003.   Pt meds include:  Statin :Yes  ASA: yes, taking 325 mg Other anticoagulants/antiplatelets: no     Past Medical History  Diagnosis Date  . Hyperlipidemia   . Hypertension   . PONV (postoperative nausea and vomiting)   . Right ureteral stone   . History of kidney stones   . Type 2 diabetes mellitus   . PAD (peripheral artery disease)   . Atherosclerosis of aorta   . GERD (gastroesophageal reflux disease)   . Asymptomatic cholelithiasis   .  Peripheral arterial occlusive disease     lower extremities  . Wears glasses   . Hypothyroidism     no meds    Social History History  Substance Use Topics  . Smoking status: Former Smoker -- 1.00 packs/day for 28 years    Types: Cigarettes    Quit date: 09/05/2001  . Smokeless tobacco: Never Used  . Alcohol Use: No    Family History Family History  Problem Relation Age of Onset  . Arthritis Other   . Cancer Other     colon  . Hypertension Other   . Stroke Other   . Diabetes Father   . Heart disease Father   . Deep vein thrombosis Father   . Hyperlipidemia Father   . Diabetes Sister   . Heart disease Sister   . Deep vein thrombosis Sister   . Hyperlipidemia Sister   . Diabetes Brother   . Heart disease Brother   . Cancer Brother   . Hyperlipidemia Brother   . Colon cancer Maternal Aunt   . Diabetes Brother   . Diabetes Brother   . Kidney disease Mother   . Hyperlipidemia Mother   . Other Mother     AAA   and    Amputation    Past Surgical History  Procedure Laterality Date  . Belpharoptosis repair      eyelid lift  . Cystoscopy with stent placement Right  06/04/2013    Procedure: CYSTOSCOPY WITH STENT PLACEMENT;  Surgeon: Franchot Gallo, MD;  Location: [facility];  Service: Urology;  Laterality: Right;  . Right knee patellectomy w/ repair of extensor mechanism  04-14-2002  . Orif fifth metacarpal fx  right hand  04-21-2002  . Laparoscopic gastric banding  05-29-2005  . Revision and re-siting lap-band port  04-08-2010    W/  UPPER EGD  . Combined augmentation mammaplasty and abdominoplasty  2009    W/  BILATERAL  THIGH LIFT  . Cysto/  right ureteral stent placement  12-30-2010  . Extracorporeal shock wave lithotripsy Right 08-04-2013//   06-23-2013//   01-16-2011  . Cystoscopy with retrograde pyelogram, ureteroscopy and stent placement Right 10/07/2013    Procedure: CYSTOSCOPY WITH RETROGRADE PYELOGRAM, right URETEROSCOPY AND STENT PLACEMENT, stone extraction;   Surgeon: Arvil Persons, MD;  Location: [facility];  Service: Urology;  Laterality: Right;  . Holmium laser application Right 08/29/256    Procedure: HOLMIUM LASER APPLICATION;  Surgeon: Arvil Persons, MD;  Location: [facility];  Service: Urology;  Laterality: Right;  . Kidney stone surgery  April 2015    1-2 stones  . Toenail removed Left Jan. 21, 2016    2nd toenail-  Dr. Barkley Bruns    Allergies  Allergen Reactions  . Amlodipine     edema  . Atorvastatin     muscle aches  . Ampicillin Nausea And Vomiting  . Codeine Nausea And Vomiting  . Lisinopril Other (See Comments)     cough  . Penicillins Nausea And Vomiting  . Januvia [Sitagliptin]     nausea    Current Outpatient Prescriptions  Medication Sig Dispense Refill  . Canagliflozin (INVOKANA) 300 MG TABS Take 1 tablet (300 mg total) by mouth every morning. 30 tablet 11  . cetirizine (ZYRTEC ALLERGY) 10 MG tablet Take 10 mg by mouth daily.    . chlorpheniramine-HYDROcodone (TUSSIONEX) 10-8 MG/5ML LQCR Take 5 mLs by mouth every 12 (twelve) hours as needed for cough. 115 mL 0  . ezetimibe (ZETIA) 10 MG tablet Take 1 tablet (10 mg total) by mouth daily. 90 tablet 3  . Ferrous Sulfate (IRON) 325 (65 FE) MG TABS Take by mouth daily.    [name] gabapentin (NEURONTIN) 300 MG capsule Take 1 capsule (300 mg total) by mouth 3 (three) times daily. 90 capsule 11  . glyBURIDE (DIABETA) 5 MG tablet TAKE ONE TABLET BY MOUTH ONCE DAILY 90 tablet 3  . Insulin Detemir (LEVEMIR FLEXTOUCH) 100 UNIT/ML Pen Inject 30 Units into the skin daily at 10 pm. DX 250.72 15 mL 11  . Insulin Pen Needle (NOVOFINE PLUS) 32G X 4 MM MISC Use to inject once a day Dx 250.00 100 each 3  . losartan-hydrochlorothiazide (HYZAAR) 100-12.5 MG per tablet Take 1 tablet by mouth daily. 90 tablet 3  . Magnesium Oxide 400 MG CAPS Take 1 capsule (400 mg total) by mouth daily. 90 capsule 3  . metFORMIN (GLUCOPHAGE) 1000 MG tablet Take 1,000 mg by mouth 2 (two)  times daily.    . nitrofurantoin, macrocrystal-monohydrate, (MACROBID) 100 MG capsule Take 100 mg by mouth 2 (two) times daily.  0  . pantoprazole (PROTONIX) 40 MG tablet Take 40 mg by mouth every morning.     . Pitavastatin Calcium (LIVALO) 2 MG TABS Take 1 tablet (2 mg total) by mouth daily. 30 tablet 11   No current facility-administered medications for this visit.    ROS: See HPI for pertinent  positives and negatives.   Physical Examination  Filed Vitals:   05/25/14 1527  BP: 121/73  Pulse: 71  Resp: 16  Height: 5\' 5"  (1.651 m)  Weight: 168 lb (76.204 kg)  SpO2: 100%   Body mass index is 27.96 kg/(m^2).  General: A&O x 3, WDWN.  Gait: normal  Eyes: PERRLA.  Pulmonary: Respirations non labored.  Cardiac: regular Rythm   Carotid Bruits  Left  Right    Negative  Negative    Aorta is not palpable.  Radial pulses: 2+ palable   VASCULAR EXAM:  Extremities without ischemic changes  without Gangrene; without open wounds. Left great toenail is surgically absent. Left second toenail bed is black (surgically removed 4 days ago), toe tip has sluggish capillary refill and is moderately swollen. Distal aspect of left foot is swollen with erythema at base of left second toe; however, after elevating left foot slightly above heart level for a bout a minute, the erythema resolves and swelling slightly diminishes.  LE Pulses  LEFT  RIGHT   FEMORAL  palpable  palpable   POPLITEAL  not palpable  not palpable   POSTERIOR TIBIAL  not palpable  not palpable   DORSALIS PEDIS  ANTERIOR TIBIAL  not palpable  faintly palpable    Abdomen: soft, NT, no masses palpated.  Skin: no rashes, no ulcers noted.  Musculoskeletal: no muscle wasting or atrophy.  Neurologic: A&O X 3; Appropriate Affect, MOTOR FUNCTION: moving all extremities equally, motor strength 5/5 throughout. Speech is fluent/normal. CN 2-12 intact.   Non-Invasive  Vascular Imaging: DATE: 05/25/2014 ABI: RIGHT 0.83 (02/20/14, 0.76), Waveforms: monophasic; TBI: 0.26  LEFT 0.63 (0.57), Waveforms: monophasic; TBI: 0.26   ASSESSMENT: Theresa Harrington is a 59 y.o. female who presents with known history of PAD and intermittent claudication.  She had her left second toenail removed four days ago by her podiatrist after a possible injury to this toe and was referred here by her podiatrist for evaluation of healing of this.  Pt has apparently not been elevating her left foot above her heart when she is not walking which has encouraged swelling and mild erythema of the distal aspect of her left foot since the 2nd toenail was removed. With elevation of her left foot in the office above her heart for about a minute, the erythema has mostly resolved and it is anticipated that the swelling will resolve also with adequate elevation which will facilitate healing. Pt was instructed in proper elevation of her left foot. She has not had normal TBI's, in fact no TBI reading could be obtained three months ago, but is measurable today with slightly improved bilateral TBI's; she has been participating in a graduated walking program, her DM is almost in control, she is a former smoker.  PLAN:  I discussed in depth with the patient the nature of atherosclerosis, and emphasized the importance of maximal medical management including strict control of blood pressure, blood glucose, and lipid levels, obtaining regular exercise, and continued cessation of smoking.  The patient is aware that without maximal medical management the underlying atherosclerotic disease process will progress, limiting the benefit of any interventions.  Based on the patient's vascular studies and examination, and after discussing with Dr. Donnetta Hutching,  pt will return to clinic in 1 week and follow up with Dr. Trula Slade to evaluate the viability of her left 2nd toe.  The patient was given information about PAD including signs,  symptoms, treatment, what symptoms should prompt the patient to seek immediate  medical care, and risk reduction measures to take.  Clemon Chambers, RN, MSN, FNP-C [facility] Phone: [phone]  Clinic MD: Early on call  05/25/2014  3:38 PM

## 2014-05-25 NOTE — Patient Instructions (Signed)

## 2014-05-28 ENCOUNTER — Encounter: Payer: Self-pay | Admitting: Surgery

## 2014-06-01 ENCOUNTER — Encounter (HOSPITAL_COMMUNITY): Payer: Self-pay | Admitting: General Practice

## 2014-06-01 ENCOUNTER — Inpatient Hospital Stay (HOSPITAL_COMMUNITY)
Admission: AD | Admit: 2014-06-01 | Discharge: 2014-06-13 | DRG: 271 | Disposition: A | Discharge status: Home Health | Payer: 59 | Source: Ambulatory Visit | Attending: Surgery | Admitting: Surgery

## 2014-06-01 ENCOUNTER — Encounter: Payer: Self-pay | Admitting: Surgery

## 2014-06-01 ENCOUNTER — Ambulatory Visit (INDEPENDENT_AMBULATORY_CARE_PROVIDER_SITE_OTHER): Payer: 59 | Admitting: Surgery

## 2014-06-01 VITALS — BP 129/72 | HR 66 | Temp 97.8°F | Resp 18 | Ht 65.0 in | Wt 169.0 lb

## 2014-06-01 DIAGNOSIS — E785 Hyperlipidemia, unspecified: Secondary | ICD-10-CM | POA: Diagnosis present

## 2014-06-01 DIAGNOSIS — L97429 Non-pressure chronic ulcer of left heel and midfoot with unspecified severity: Secondary | ICD-10-CM | POA: Diagnosis present

## 2014-06-01 DIAGNOSIS — I700 Atherosclerosis of aorta: Secondary | ICD-10-CM | POA: Diagnosis present

## 2014-06-01 DIAGNOSIS — Y832 Surgical operation with anastomosis, bypass or graft as the cause of abnormal reaction of the patient, or of later complication, without mention of misadventure at the time of the procedure: Secondary | ICD-10-CM | POA: Diagnosis not present

## 2014-06-01 DIAGNOSIS — E119 Type 2 diabetes mellitus without complications: Secondary | ICD-10-CM | POA: Diagnosis present

## 2014-06-01 DIAGNOSIS — I70244 Atherosclerosis of native arteries of left leg with ulceration of heel and midfoot: Secondary | ICD-10-CM | POA: Diagnosis present

## 2014-06-01 DIAGNOSIS — T82868A Thrombosis of vascular prosthetic devices, implants and grafts, initial encounter: Secondary | ICD-10-CM | POA: Diagnosis not present

## 2014-06-01 DIAGNOSIS — L03116 Cellulitis of left lower limb: Secondary | ICD-10-CM | POA: Diagnosis present

## 2014-06-01 DIAGNOSIS — I96 Gangrene, not elsewhere classified: Secondary | ICD-10-CM | POA: Diagnosis not present

## 2014-06-01 DIAGNOSIS — K219 Gastro-esophageal reflux disease without esophagitis: Secondary | ICD-10-CM | POA: Diagnosis present

## 2014-06-01 DIAGNOSIS — Z87891 Personal history of nicotine dependence: Secondary | ICD-10-CM | POA: Diagnosis not present

## 2014-06-01 DIAGNOSIS — S90425A Blister (nonthermal), left lesser toe(s), initial encounter: Secondary | ICD-10-CM

## 2014-06-01 DIAGNOSIS — Z794 Long term (current) use of insulin: Secondary | ICD-10-CM | POA: Diagnosis not present

## 2014-06-01 DIAGNOSIS — Y828 Other medical devices associated with adverse incidents: Secondary | ICD-10-CM | POA: Diagnosis not present

## 2014-06-01 DIAGNOSIS — I10 Essential (primary) hypertension: Secondary | ICD-10-CM | POA: Diagnosis present

## 2014-06-01 DIAGNOSIS — I7025 Atherosclerosis of native arteries of other extremities with ulceration: Secondary | ICD-10-CM

## 2014-06-01 DIAGNOSIS — E1151 Type 2 diabetes mellitus with diabetic peripheral angiopathy without gangrene: Secondary | ICD-10-CM

## 2014-06-01 DIAGNOSIS — I739 Peripheral vascular disease, unspecified: Secondary | ICD-10-CM | POA: Diagnosis present

## 2014-06-01 DIAGNOSIS — M79672 Pain in left foot: Secondary | ICD-10-CM | POA: Diagnosis present

## 2014-06-01 LAB — COMPREHENSIVE METABOLIC PANEL
ALBUMIN: 3 g/dL — AB (ref 3.5–5.2)
ALT: 11 U/L (ref 0–35)
AST: 14 U/L (ref 0–37)
Alkaline Phosphatase: 47 U/L (ref 39–117)
Anion gap: 6 (ref 5–15)
BUN: 14 mg/dL (ref 6–23)
CO2: 28 mmol/L (ref 19–32)
Calcium: 9.3 mg/dL (ref 8.4–10.5)
Chloride: 109 mmol/L (ref 96–112)
Creatinine, Ser: 1 mg/dL (ref 0.50–1.10)
GFR, EST AFRICAN AMERICAN: 71 mL/min — AB (ref >90)
GFR, EST NON AFRICAN AMERICAN: 61 mL/min — AB (ref >90)
GLUCOSE: 97 mg/dL (ref 70–99)
Potassium: 3.3 mmol/L — ABNORMAL LOW (ref 3.5–5.1)
Sodium: 143 mmol/L (ref 135–145)
Total Bilirubin: 0.5 mg/dL (ref 0.3–1.2)
Total Protein: 6.5 g/dL (ref 6.0–8.3)

## 2014-06-01 LAB — PROTIME-INR
INR: 1.03 (ref 0.00–1.49)
Prothrombin Time: 13.6 seconds (ref 11.6–15.2)

## 2014-06-01 LAB — CBC
HEMATOCRIT: 35.9 % — AB (ref 36.0–46.0)
HEMOGLOBIN: 12 g/dL (ref 12.0–15.0)
MCH: 29 pg (ref 26.0–34.0)
MCHC: 33.4 g/dL (ref 30.0–36.0)
MCV: 86.7 fL (ref 78.0–100.0)
Platelets: 263 10*3/uL (ref 150–400)
RBC: 4.14 MIL/uL (ref 3.87–5.11)
RDW: 13.9 % (ref 11.5–15.5)
WBC: 13.7 10*3/uL — ABNORMAL HIGH (ref 4.0–10.5)

## 2014-06-01 LAB — GLUCOSE, CAPILLARY: GLUCOSE-CAPILLARY: 105 mg/dL — AB (ref 70–99)

## 2014-06-01 MED ORDER — PANTOPRAZOLE SODIUM 40 MG PO TBEC: 40.0000 mg | DELAYED_RELEASE_TABLET | Freq: Every day | ORAL | Status: DC

## 2014-06-01 MED ORDER — INSULIN DETEMIR 100 UNIT/ML ~~LOC~~ SOLN
30.0000 [IU] | Freq: Every day | SUBCUTANEOUS | Status: DC
  Administered 2014-06-02 – 2014-06-12 (×11): 30 [IU] via SUBCUTANEOUS
  Filled 2014-06-01 (×14): qty 0.3

## 2014-06-01 MED ORDER — NITROFURANTOIN MONOHYD MACRO 100 MG PO CAPS
100.0000 mg | ORAL_CAPSULE | Freq: Two times a day (BID) | ORAL | Status: DC
  Administered 2014-06-01 – 2014-06-11 (×18): 100 mg via ORAL
  Filled 2014-06-01 (×22): qty 1

## 2014-06-01 MED ORDER — ACETAMINOPHEN 650 MG RE SUPP: 325.0000 mg | RECTAL | Status: DC | PRN

## 2014-06-01 MED ORDER — PIPERACILLIN-TAZOBACTAM 3.375 G IVPB
3.3750 g | Freq: Three times a day (TID) | INTRAVENOUS | Status: DC
  Administered 2014-06-01 – 2014-06-03 (×7): 3.375 g via INTRAVENOUS
  Filled 2014-06-01 (×11): qty 50

## 2014-06-01 MED ORDER — HYDROCHLOROTHIAZIDE 12.5 MG PO CAPS
12.5000 mg | ORAL_CAPSULE | Freq: Every day | ORAL | Status: DC
  Administered 2014-06-02 – 2014-06-13 (×11): 12.5 mg via ORAL
  Filled 2014-06-01 (×12): qty 1

## 2014-06-01 MED ORDER — SODIUM CHLORIDE 0.9 % IJ SOLN: 3.0000 mL | INTRAMUSCULAR | Status: DC | PRN

## 2014-06-01 MED ORDER — LORATADINE 10 MG PO TABS
10.0000 mg | ORAL_TABLET | Freq: Every day | ORAL | Status: DC
  Administered 2014-06-03 – 2014-06-10 (×3): 10 mg via ORAL
  Filled 2014-06-01 (×12): qty 1

## 2014-06-01 MED ORDER — MORPHINE SULFATE 2 MG/ML IJ SOLN
2.0000 mg | INTRAMUSCULAR | Status: DC | PRN
  Administered 2014-06-01 – 2014-06-04 (×6): 2 mg via INTRAVENOUS
  Filled 2014-06-01 (×7): qty 1

## 2014-06-01 MED ORDER — LOSARTAN POTASSIUM-HCTZ 100-12.5 MG PO TABS: 1.0000 | ORAL_TABLET | Freq: Every day | ORAL | Status: DC

## 2014-06-01 MED ORDER — POTASSIUM CHLORIDE CRYS ER 20 MEQ PO TBCR: 20.0000 meq | EXTENDED_RELEASE_TABLET | Freq: Once | ORAL | Status: DC

## 2014-06-01 MED ORDER — HYDRALAZINE HCL 20 MG/ML IJ SOLN: 5.0000 mg | INTRAMUSCULAR | Status: DC | PRN

## 2014-06-01 MED ORDER — HYDROCOD POLST-CHLORPHEN POLST 10-8 MG/5ML PO LQCR: 5.0000 mL | Freq: Two times a day (BID) | ORAL | Status: DC | PRN

## 2014-06-01 MED ORDER — PANTOPRAZOLE SODIUM 40 MG PO TBEC
40.0000 mg | DELAYED_RELEASE_TABLET | Freq: Every morning | ORAL | Status: DC
  Administered 2014-06-06 – 2014-06-13 (×7): 40 mg via ORAL
  Filled 2014-06-01 (×5): qty 1

## 2014-06-01 MED ORDER — EZETIMIBE 10 MG PO TABS
10.0000 mg | ORAL_TABLET | Freq: Every day | ORAL | Status: DC
  Administered 2014-06-02 – 2014-06-13 (×11): 10 mg via ORAL
  Filled 2014-06-01 (×12): qty 1

## 2014-06-01 MED ORDER — ENOXAPARIN SODIUM 30 MG/0.3ML ~~LOC~~ SOLN
30.0000 mg | SUBCUTANEOUS | Status: DC
  Administered 2014-06-01 – 2014-06-03 (×3): 30 mg via SUBCUTANEOUS
  Filled 2014-06-01 (×4): qty 0.3

## 2014-06-01 MED ORDER — ONDANSETRON HCL 4 MG/2ML IJ SOLN
4.0000 mg | Freq: Four times a day (QID) | INTRAMUSCULAR | Status: DC | PRN
  Administered 2014-06-03: 4 mg via INTRAVENOUS
  Filled 2014-06-01: qty 2

## 2014-06-01 MED ORDER — GABAPENTIN 300 MG PO CAPS
300.0000 mg | ORAL_CAPSULE | Freq: Three times a day (TID) | ORAL | Status: DC
  Administered 2014-06-01 – 2014-06-13 (×31): 300 mg via ORAL
  Filled 2014-06-01 (×38): qty 1

## 2014-06-01 MED ORDER — VANCOMYCIN HCL IN DEXTROSE 1-5 GM/200ML-% IV SOLN
1000.0000 mg | Freq: Two times a day (BID) | INTRAVENOUS | Status: DC
  Administered 2014-06-02 – 2014-06-04 (×6): 1000 mg via INTRAVENOUS
  Filled 2014-06-01 (×8): qty 200

## 2014-06-01 MED ORDER — METFORMIN HCL 500 MG PO TABS
1000.0000 mg | ORAL_TABLET | Freq: Two times a day (BID) | ORAL | Status: DC
  Administered 2014-06-02 – 2014-06-13 (×17): 1000 mg via ORAL
  Filled 2014-06-01 (×30): qty 2

## 2014-06-01 MED ORDER — INSULIN ASPART 100 UNIT/ML ~~LOC~~ SOLN
0.0000 [IU] | Freq: Three times a day (TID) | SUBCUTANEOUS | Status: DC
  Administered 2014-06-03: 2 [IU] via SUBCUTANEOUS
  Administered 2014-06-05: 3 [IU] via SUBCUTANEOUS
  Administered 2014-06-05: 5 [IU] via SUBCUTANEOUS
  Administered 2014-06-06 (×2): 2 [IU] via SUBCUTANEOUS
  Administered 2014-06-07: 3 [IU] via SUBCUTANEOUS
  Administered 2014-06-08 – 2014-06-11 (×5): 2 [IU] via SUBCUTANEOUS

## 2014-06-01 MED ORDER — ACETAMINOPHEN 325 MG PO TABS: 325.0000 mg | ORAL_TABLET | ORAL | Status: DC | PRN

## 2014-06-01 MED ORDER — CANAGLIFLOZIN 300 MG PO TABS
300.0000 mg | ORAL_TABLET | Freq: Every morning | ORAL | Status: DC
  Administered 2014-06-02 – 2014-06-13 (×11): 300 mg via ORAL
  Filled 2014-06-01 (×12): qty 300

## 2014-06-01 MED ORDER — SODIUM CHLORIDE 0.9 % IJ SOLN
3.0000 mL | Freq: Two times a day (BID) | INTRAMUSCULAR | Status: DC
  Administered 2014-06-01 – 2014-06-04 (×3): 3 mL via INTRAVENOUS

## 2014-06-01 MED ORDER — OXYCODONE-ACETAMINOPHEN 5-325 MG PO TABS
1.0000 | ORAL_TABLET | ORAL | Status: DC | PRN
  Filled 2014-06-01: qty 2

## 2014-06-01 MED ORDER — GUAIFENESIN-DM 100-10 MG/5ML PO SYRP: 15.0000 mL | ORAL_SOLUTION | ORAL | Status: DC | PRN

## 2014-06-01 MED ORDER — PHENOL 1.4 % MT LIQD
1.0000 | OROMUCOSAL | Status: DC | PRN
  Filled 2014-06-01: qty 177

## 2014-06-01 MED ORDER — METOPROLOL TARTRATE 1 MG/ML IV SOLN: 2.0000 mg | INTRAVENOUS | Status: DC | PRN

## 2014-06-01 MED ORDER — INSULIN DETEMIR 100 UNIT/ML FLEXPEN: 30.0000 [IU] | PEN_INJECTOR | Freq: Every day | SUBCUTANEOUS | Status: DC

## 2014-06-01 MED ORDER — MAGNESIUM OXIDE 400 MG PO CAPS
1.0000 | ORAL_CAPSULE | Freq: Every day | ORAL | Status: DC
  Administered 2014-06-02: 400 mg via ORAL
  Filled 2014-06-01 (×2): qty 1

## 2014-06-01 MED ORDER — SODIUM CHLORIDE 0.9 % IV SOLN: 250.0000 mL | INTRAVENOUS | Status: DC | PRN

## 2014-06-01 MED ORDER — PRAVASTATIN SODIUM 40 MG PO TABS
40.0000 mg | ORAL_TABLET | Freq: Every day | ORAL | Status: DC
  Administered 2014-06-02 – 2014-06-12 (×9): 40 mg via ORAL
  Filled 2014-06-01 (×15): qty 1

## 2014-06-01 MED ORDER — LABETALOL HCL 5 MG/ML IV SOLN
10.0000 mg | INTRAVENOUS | Status: DC | PRN
  Filled 2014-06-01: qty 4

## 2014-06-01 MED ORDER — ALUM & MAG HYDROXIDE-SIMETH 200-200-20 MG/5ML PO SUSP: 15.0000 mL | ORAL | Status: DC | PRN

## 2014-06-01 MED ORDER — LOSARTAN POTASSIUM 50 MG PO TABS
100.0000 mg | ORAL_TABLET | Freq: Every day | ORAL | Status: DC
  Administered 2014-06-02 – 2014-06-13 (×11): 100 mg via ORAL
  Filled 2014-06-01 (×12): qty 2

## 2014-06-01 NOTE — Progress Notes (Signed)
ANTIBIOTIC CONSULT NOTE - INITIAL  Pharmacy Consult for Vancomycin  Indication: Wound Infection - Toe  Allergies  Allergen Reactions  . Amlodipine     edema  . Atorvastatin     muscle aches  . Ampicillin Nausea And Vomiting  . Codeine Nausea And Vomiting  . Lisinopril Other (See Comments)     cough  . Penicillins Nausea And Vomiting  . Januvia [Sitagliptin]     nausea    Patient Measurements:   Total Body Weight: 77kg  Vital Signs: Temp: 97.8 F (36.6 C) (02/01 1458) Temp Source: Oral (02/01 1458) BP: 129/72 mmHg (02/01 1458) Pulse Rate: 66 (02/01 1458) Intake/Output from previous day:   Intake/Output from this shift:    Labs: No results for input(s): WBC, HGB, PLT, LABCREA, CREATININE in the last 72 hours. CrCl cannot be calculated (Patient has no serum creatinine result on file.). No results for input(s): VANCOTROUGH, VANCOPEAK, VANCORANDOM, GENTTROUGH, GENTPEAK, GENTRANDOM, TOBRATROUGH, TOBRAPEAK, TOBRARND, AMIKACINPEAK, AMIKACINTROU, AMIKACIN in the last 72 hours.   Microbiology: No results found for this or any previous visit (from the past 720 hour(s)).  Medical History: Past Medical History  Diagnosis Date  . Hyperlipidemia   . Hypertension   . PONV (postoperative nausea and vomiting)   . Right ureteral stone   . History of kidney stones   . Type 2 diabetes mellitus   . PAD (peripheral artery disease)   . Atherosclerosis of aorta   . GERD (gastroesophageal reflux disease)   . Asymptomatic cholelithiasis   . Peripheral arterial occlusive disease     lower extremities  . Wears glasses   . Hypothyroidism     no meds     Assessment: 58yof admitted for L toe wound infection that was not improving with po antibiotics as outpatient.  Admission labs not drawn yet but Cr from previous admission 0.8.  Will follow up lab results for ABX dose adjustment.    Goal of Therapy:  Vancomycin trough level 15-20 mcg/ml  Plan:  Vancomycin 1gm IV q12 Zosyn  3.375gm IV q8 EI  Bonnita Nasuti Pharm.D. CPP, BCPS Clinical Pharmacist [phone] 06/01/2014 7:05 PM

## 2014-06-01 NOTE — Progress Notes (Signed)
History and Physical  History of Present Illness  Theresa Harrington is a 59 y.o. female who presents with chief complaint: Left foot erythema and edema since she had a toe nil removed on 05/21/2014 after an injury.   The patient presents today for re evaluation of the left foot.  She has a history of PAD and DM.  She was on oral antibiotics that has not helped her infection.  Her past medical history also includes Hypertension and hyperlipidemia.  She takes a Statin daily.    Past Medical History  Diagnosis Date  . Hyperlipidemia   . Hypertension   . PONV (postoperative nausea and vomiting)   . Right ureteral stone   . History of kidney stones   . Type 2 diabetes mellitus   . PAD (peripheral artery disease)   . Atherosclerosis of aorta   . GERD (gastroesophageal reflux disease)   . Asymptomatic cholelithiasis   . Peripheral arterial occlusive disease     lower extremities  . Wears glasses   . Hypothyroidism     no meds    Past Surgical History  Procedure Laterality Date  . Belpharoptosis repair      eyelid lift  . Cystoscopy with stent placement Right 06/04/2013    Procedure: CYSTOSCOPY WITH STENT PLACEMENT;  Surgeon: Franchot Gallo, MD;  Location: [facility];  Service: Urology;  Laterality: Right;  . Right knee patellectomy w/ repair of extensor mechanism  04-14-2002  . Orif fifth metacarpal fx  right hand  04-21-2002  . Laparoscopic gastric banding  05-29-2005  . Revision and re-siting lap-band port  04-08-2010    W/  UPPER EGD  . Combined augmentation mammaplasty and abdominoplasty  2009    W/  BILATERAL  THIGH LIFT  . Cysto/  right ureteral stent placement  12-30-2010  . Extracorporeal shock wave lithotripsy Right 08-04-2013//   06-23-2013//   01-16-2011  . Cystoscopy with retrograde pyelogram, ureteroscopy and stent placement Right 10/07/2013    Procedure: CYSTOSCOPY WITH RETROGRADE PYELOGRAM, right URETEROSCOPY AND STENT PLACEMENT, stone extraction;  Surgeon: Arvil Persons, MD;   Location: [facility];  Service: Urology;  Laterality: Right;  . Holmium laser application Right 11/29/8297    Procedure: HOLMIUM LASER APPLICATION;  Surgeon: Arvil Persons, MD;  Location: [facility];  Service: Urology;  Laterality: Right;  . Kidney stone surgery  April 2015    1-2 stones  . Toenail removed Left Jan. 21, 2016    2nd toenail-  Dr. Barkley Bruns    History   Social History  . Marital Status: Single    Spouse Name: N/A    Number of Children: N/A  . Years of Education: N/A   Occupational History  . tester    Social History Main Topics  . Smoking status: Former Smoker -- 1.00 packs/day for 28 years    Types: Cigarettes    Quit date: 09/05/2001  . Smokeless tobacco: Never Used  . Alcohol Use: No  . Drug Use: No  . Sexual Activity: Not Currently   Other Topics Concern  . Not on file   Social History Narrative   Regular exercise- yes    Family History  Problem Relation Age of Onset  . Arthritis Other   . Cancer Other     colon  . Hypertension Other   . Stroke Other   . Diabetes Father   . Heart disease Father   . Deep vein thrombosis Father   .  Hyperlipidemia Father   . Diabetes Sister   . Heart disease Sister   . Deep vein thrombosis Sister   . Hyperlipidemia Sister   . Diabetes Brother   . Heart disease Brother   . Cancer Brother   . Hyperlipidemia Brother   . Colon cancer Maternal Aunt   . Diabetes Brother   . Diabetes Brother   . Kidney disease Mother   . Hyperlipidemia Mother   . Other Mother     AAA   and    Amputation    Current Outpatient Prescriptions on File Prior to Visit  Medication Sig Dispense Refill  . Canagliflozin (INVOKANA) 300 MG TABS Take 1 tablet (300 mg total) by mouth every morning. 30 tablet 11  . cetirizine (ZYRTEC ALLERGY) 10 MG tablet Take 10 mg by mouth daily.    . chlorpheniramine-HYDROcodone (TUSSIONEX) 10-8 MG/5ML LQCR Take 5 mLs by mouth every 12 (twelve) hours as needed for cough. 115 mL  0  . ezetimibe (ZETIA) 10 MG tablet Take 1 tablet (10 mg total) by mouth daily. 90 tablet 3  . Ferrous Sulfate (IRON) 325 (65 FE) MG TABS Take by mouth daily.    [name] gabapentin (NEURONTIN) 300 MG capsule Take 1 capsule (300 mg total) by mouth 3 (three) times daily. 90 capsule 11  . glyBURIDE (DIABETA) 5 MG tablet TAKE ONE TABLET BY MOUTH ONCE DAILY 90 tablet 3  . Insulin Detemir (LEVEMIR FLEXTOUCH) 100 UNIT/ML Pen Inject 30 Units into the skin daily at 10 pm. DX 250.72 15 mL 11  . Insulin Pen Needle (NOVOFINE PLUS) 32G X 4 MM MISC Use to inject once a day Dx 250.00 100 each 3  . losartan-hydrochlorothiazide (HYZAAR) 100-12.5 MG per tablet Take 1 tablet by mouth daily. 90 tablet 3  . Magnesium Oxide 400 MG CAPS Take 1 capsule (400 mg total) by mouth daily. 90 capsule 3  . metFORMIN (GLUCOPHAGE) 1000 MG tablet Take 1,000 mg by mouth 2 (two) times daily.    . nitrofurantoin, macrocrystal-monohydrate, (MACROBID) 100 MG capsule Take 100 mg by mouth 2 (two) times daily.  0  . pantoprazole (PROTONIX) 40 MG tablet Take 40 mg by mouth every morning.     . Pitavastatin Calcium (LIVALO) 2 MG TABS Take 1 tablet (2 mg total) by mouth daily. 30 tablet 11   No current facility-administered medications on file prior to visit.    Allergies  Allergen Reactions  . Amlodipine     edema  . Atorvastatin     muscle aches  . Ampicillin Nausea And Vomiting  . Codeine Nausea And Vomiting  . Lisinopril Other (See Comments)     cough  . Penicillins Nausea And Vomiting  . Januvia [Sitagliptin]     nausea    Review of Systems: As listed above, otherwise negative.  Physical Examination  Filed Vitals:   06/01/14 1458  BP: 129/72  Pulse: 66  Temp: 97.8 F (36.6 C)  TempSrc: Oral  Resp: 18  Height: 5\' 5"  (1.651 m)  Weight: 169 lb (76.658 kg)  SpO2: 99%    General: A&O x 3, WDWN  Pulmonary: Sym exp, good air movt, CTAB, no rales, rhonchi, & wheezing  Cardiac: RRR, Nl S1, S2, no Murmurs,    Gastrointestinal: soft, NT  Musculoskeletal: M/S 5/5 throughout   Extremities with ischemic changes left second toe dusky and the nail bed is black.  There is a fisher crack between the second and third toes.  Erythema on the dorsum forefoot.  Moderated edema.  She has poor sensation and denise pain.  Laboratory See iStat  Non-Invasive Vascular Imaging: DATE: 05/25/2014 ABI: RIGHT 0.83 (02/20/14, 0.76), Waveforms: monophasic; TBI: 0.26 LEFT 0.63 (0.57), Waveforms: monophasic; TBI: 0.26 Assesment: PAD bilateral She has 60% arterial flow to the left foot with an ischemic/infected toe.  She has failed oral antibiotic therapy.  We will admit her for IV antibiotics.    She was seen in conjunction with Dr. Trula Slade today      Theda Sers, [facility] PA-C [facility] [place]: [phone]   06/01/2014, 3:46 PM  I agree with the above.  I have seen and examined the patient.  This is a patient that I have been following for left leg claudication.  Her ABIs have remained stable in the 0.6 range and she has remained stable with regards to her symptoms.  She recently underwent a left second toe nail removal.  This has not healed and she has developed cellulitis extending up onto the dorsum of her foot associated with pain and edema.  She has taken oral antibiotics and this has not helped.  Therefore, I think she needs admission to the hospital for IV antibiotics.  She will also need to have a angiogram to define her anatomy, as she will likely need revascularization in order to heal her wound.  She is at risk for losing the toe.     [name]

## 2014-06-01 NOTE — H&P (Signed)
Expand All Collapse All      History and Physical  History of Present Illness  Theresa Harrington is a 58 y.o. female who presents with chief complaint: Left foot erythema and edema since she had a toe nil removed on 05/21/2014 after an injury. The patient presents today for re evaluation of the left foot. She has a history of PAD and DM. She was on oral antibiotics that has not helped her infection. Her past medical history also includes Hypertension and hyperlipidemia. She takes a Statin daily.   Past Medical History  Diagnosis Date  . Hyperlipidemia   . Hypertension   . PONV (postoperative nausea and vomiting)   . Right ureteral stone   . History of kidney stones   . Type 2 diabetes mellitus   . PAD (peripheral artery disease)   . Atherosclerosis of aorta   . GERD (gastroesophageal reflux disease)   . Asymptomatic cholelithiasis   . Peripheral arterial occlusive disease     lower extremities  . Wears glasses   . Hypothyroidism     no meds    Past Surgical History  Procedure Laterality Date  . Belpharoptosis repair      eyelid lift  . Cystoscopy with stent placement Right 06/04/2013    Procedure: CYSTOSCOPY WITH STENT PLACEMENT; Surgeon: Franchot Gallo, MD; Location: [facility]; Service: Urology; Laterality: Right;  . Right knee patellectomy w/ repair of extensor mechanism  04-14-2002  . Orif fifth metacarpal fx right hand  04-21-2002  . Laparoscopic gastric banding  05-29-2005  . Revision and re-siting lap-band port  04-08-2010    W/ UPPER EGD  . Combined augmentation mammaplasty and abdominoplasty  2009    W/ BILATERAL THIGH LIFT  . Cysto/ right ureteral stent placement  12-30-2010  . Extracorporeal shock wave lithotripsy Right 08-04-2013// 06-23-2013// 01-16-2011  . Cystoscopy with retrograde pyelogram, ureteroscopy and stent placement Right  10/07/2013    Procedure: CYSTOSCOPY WITH RETROGRADE PYELOGRAM, right URETEROSCOPY AND STENT PLACEMENT, stone extraction; Surgeon: Arvil Persons, MD; Location: [facility]; Service: Urology; Laterality: Right;  . Holmium laser application Right 0/05/930    Procedure: HOLMIUM LASER APPLICATION; Surgeon: Arvil Persons, MD; Location: [facility]; Service: Urology; Laterality: Right;  . Kidney stone surgery  April 2015    1-2 stones  . Toenail removed Left Jan. 21, 2016    2nd toenail- Dr. Barkley Bruns    History   Social History  . Marital Status: Single    Spouse Name: N/A    Number of Children: N/A  . Years of Education: N/A   Occupational History  . tester    Social History Main Topics  . Smoking status: Former Smoker -- 1.00 packs/day for 28 years    Types: Cigarettes    Quit date: 09/05/2001  . Smokeless tobacco: Never Used  . Alcohol Use: No  . Drug Use: No  . Sexual Activity: Not Currently   Other Topics Concern  . Not on file   Social History Narrative   Regular exercise- yes    Family History  Problem Relation Age of Onset  . Arthritis Other   . Cancer Other     colon  . Hypertension Other   . Stroke Other   . Diabetes Father   . Heart disease Father   . Deep vein thrombosis Father   . Hyperlipidemia Father   . Diabetes Sister   . Heart disease Sister   . Deep vein thrombosis Sister   .  Hyperlipidemia Sister   . Diabetes Brother   . Heart disease Brother   . Cancer Brother   . Hyperlipidemia Brother   . Colon cancer Maternal Aunt   . Diabetes Brother   . Diabetes Brother   . Kidney disease Mother   . Hyperlipidemia Mother   . Other Mother     AAA and Amputation    Current Outpatient Prescriptions on File Prior to Visit    Medication Sig Dispense Refill  . Canagliflozin (INVOKANA) 300 MG TABS Take 1 tablet (300 mg total) by mouth every morning. 30 tablet 11  . cetirizine (ZYRTEC ALLERGY) 10 MG tablet Take 10 mg by mouth daily.    . chlorpheniramine-HYDROcodone (TUSSIONEX) 10-8 MG/5ML LQCR Take 5 mLs by mouth every 12 (twelve) hours as needed for cough. 115 mL 0  . ezetimibe (ZETIA) 10 MG tablet Take 1 tablet (10 mg total) by mouth daily. 90 tablet 3  . Ferrous Sulfate (IRON) 325 (65 FE) MG TABS Take by mouth daily.    [name] gabapentin (NEURONTIN) 300 MG capsule Take 1 capsule (300 mg total) by mouth 3 (three) times daily. 90 capsule 11  . glyBURIDE (DIABETA) 5 MG tablet TAKE ONE TABLET BY MOUTH ONCE DAILY 90 tablet 3  . Insulin Detemir (LEVEMIR FLEXTOUCH) 100 UNIT/ML Pen Inject 30 Units into the skin daily at 10 pm. DX 250.72 15 mL 11  . Insulin Pen Needle (NOVOFINE PLUS) 32G X 4 MM MISC Use to inject once a day Dx 250.00 100 each 3  . losartan-hydrochlorothiazide (HYZAAR) 100-12.5 MG per tablet Take 1 tablet by mouth daily. 90 tablet 3  . Magnesium Oxide 400 MG CAPS Take 1 capsule (400 mg total) by mouth daily. 90 capsule 3  . metFORMIN (GLUCOPHAGE) 1000 MG tablet Take 1,000 mg by mouth 2 (two) times daily.    . nitrofurantoin, macrocrystal-monohydrate, (MACROBID) 100 MG capsule Take 100 mg by mouth 2 (two) times daily.  0  . pantoprazole (PROTONIX) 40 MG tablet Take 40 mg by mouth every morning.     . Pitavastatin Calcium (LIVALO) 2 MG TABS Take 1 tablet (2 mg total) by mouth daily. 30 tablet 11   No current facility-administered medications on file prior to visit.    Allergies  Allergen Reactions  . Amlodipine     edema  . Atorvastatin     muscle aches  . Ampicillin Nausea And Vomiting  . Codeine Nausea And Vomiting  . Lisinopril Other (See Comments)    cough  . Penicillins Nausea And  Vomiting  . Januvia [Sitagliptin]     nausea    Review of Systems: As listed above, otherwise negative.  Physical Examination  Filed Vitals:   06/01/14 1458  BP: 129/72  Pulse: 66  Temp: 97.8 F (36.6 C)  TempSrc: Oral  Resp: 18  Height: 5\' 5"  (1.651 m)  Weight: 169 lb (76.658 kg)  SpO2: 99%    General: A&O x 3, WDWN  Pulmonary: Sym exp, good air movt, CTAB, no rales, rhonchi, & wheezing  Cardiac: RRR, Nl S1, S2, no Murmurs,   Gastrointestinal: soft, NT  Musculoskeletal: M/S 5/5 throughout  Extremities with ischemic changes left second toe dusky and the nail bed is black. There is a fisher crack between the second and third toes. Erythema on the dorsum forefoot. Moderated edema. She has poor sensation and denise pain.  Laboratory See iStat  Non-Invasive Vascular Imaging: DATE: 05/25/2014 ABI: RIGHT 0.83 (02/20/14, 0.76), Waveforms: monophasic; TBI: 0.26 LEFT 0.63 (0.57), Waveforms:  monophasic; TBI: 0.26 Assesment: PAD bilateral She has 60% arterial flow to the left foot with an ischemic/infected toe. She has failed oral antibiotic therapy. We will admit her for IV antibiotics.    She was seen in conjunction with Dr. Trula Slade today      Theda Sers, [facility] PA-C [facility] [place]: [phone]   06/01/2014, 3:46 PM  I agree with the above. I have seen and examined the patient. This is a patient that I have been following for left leg claudication. Her ABIs have remained stable in the 0.6 range and she has remained stable with regards to her symptoms. She recently underwent a left second toe nail removal. This has not healed and she has developed cellulitis extending up onto the dorsum of her foot associated with pain and edema. She has taken oral antibiotics and this has not helped. Therefore, I think she needs admission to the hospital for IV antibiotics. She will also need to have a  angiogram to define her anatomy, as she will likely need revascularization in order to heal her wound. She is at risk for losing the toe.    [name]

## 2014-06-02 DIAGNOSIS — I70245 Atherosclerosis of native arteries of left leg with ulceration of other part of foot: Secondary | ICD-10-CM

## 2014-06-02 LAB — URINE MICROSCOPIC-ADD ON

## 2014-06-02 LAB — GLUCOSE, CAPILLARY
GLUCOSE-CAPILLARY: 78 mg/dL (ref 70–99)
GLUCOSE-CAPILLARY: 116 mg/dL — AB (ref 70–99)
Glucose-Capillary: 91 mg/dL (ref 70–99)
Glucose-Capillary: 123 mg/dL — ABNORMAL HIGH (ref 70–99)
Glucose-Capillary: 90 mg/dL (ref 70–99)

## 2014-06-02 LAB — URINALYSIS, ROUTINE W REFLEX MICROSCOPIC
Bilirubin Urine: NEGATIVE
Glucose, UA: >1000 mg/dL — AB
Ketones, ur: NEGATIVE mg/dL
Nitrite: NEGATIVE
PH: 5.5 (ref 5.0–8.0)
PROTEIN: NEGATIVE mg/dL
Specific Gravity, Urine: 1.029 (ref 1.005–1.030)
Urobilinogen, UA: 0.2 mg/dL (ref 0.0–1.0)

## 2014-06-02 NOTE — Progress Notes (Signed)
Utilization review completed.  

## 2014-06-02 NOTE — Progress Notes (Addendum)
   [facility] [place]  Subjective  - No new complaints.   Objective 144/62 52 98.2 F (36.8 C) (Oral) 18 98%  Intake/Output Summary (Last 24 hours) at 06/02/14 1011 Last data filed at 06/02/14 0930  Gross per 24 hour  Intake    240 ml  Output      0 ml  Net    240 ml    Left foot erythema Nail bed is black and plantar toe dusky No change currently  Assessment/Planning: PAD bilateral Left foot cellulitis with ischemic changes to second digit IV vanco/zosyn     Laurence Slate [facility] 06/02/2014 10:11 AM --  Laboratory Lab Results:  Recent Labs  06/01/14 1902  WBC 13.7*  HGB 12.0  HCT 35.9*  PLT 263   BMET  Recent Labs  06/01/14 1902  NA 143  K 3.3*  CL 109  CO2 28  GLUCOSE 97  BUN 14  CREATININE 1.00  CALCIUM 9.3    COAG Lab Results  Component Value Date   INR 1.03 06/01/2014   No results found for: PTT    I agree with the above.  I have seen  And examined the patient.  She does not have a palpable pedal pulse on the left.  HEr erythema is better as is her pain on the dorsum of her foot  Recommend continuation of IV abx with plans for angiogram later this week if her infectious issues continue to improve  Wells Brabham

## 2014-06-03 LAB — GLUCOSE, CAPILLARY
GLUCOSE-CAPILLARY: 127 mg/dL — AB (ref 70–99)
Glucose-Capillary: 133 mg/dL — ABNORMAL HIGH (ref 70–99)
Glucose-Capillary: 101 mg/dL — ABNORMAL HIGH (ref 70–99)

## 2014-06-03 MED ORDER — MAGNESIUM OXIDE 400 (241.3 MG) MG PO TABS
400.0000 mg | ORAL_TABLET | Freq: Every day | ORAL | Status: DC
  Administered 2014-06-03 – 2014-06-07 (×5): 400 mg via ORAL
  Filled 2014-06-03 (×6): qty 1

## 2014-06-03 MED FILL — Magnesium Oxide Tab 400 MG (241.3 MG Elemental Mg): ORAL | Qty: 1 | Status: AC

## 2014-06-03 NOTE — Progress Notes (Signed)
  Progress Note    06/03/2014 8:30 AM Hospital Day 2  Subjective:  States her foot is about the same  Afebrile HR 50's NSR 39'J-673'A systolic  19% RA  Abx:   Vanc Zosyn  Filed Vitals:   06/03/14 0411  BP: 132/60  Pulse: 52  Temp: 98.4 F (36.9 C)  Resp: 18    Physical Exam: Extremities:  Still with erythema of the dorsum of left foot; 2nd toe with black nailbed  CBC    Component Value Date/Time   WBC 13.7* 06/01/2014 1902   RBC 4.14 06/01/2014 1902   HGB 12.0 06/01/2014 1902   HCT 35.9* 06/01/2014 1902   PLT 263 06/01/2014 1902   MCV 86.7 06/01/2014 1902   MCH 29.0 06/01/2014 1902   MCHC 33.4 06/01/2014 1902   RDW 13.9 06/01/2014 1902   LYMPHSABS 2.7 10/28/2013 1141   MONOABS 0.5 10/28/2013 1141   EOSABS 0.2 10/28/2013 1141   BASOSABS 0.0 10/28/2013 1141    BMET    Component Value Date/Time   NA 143 06/01/2014 1902   K 3.3* 06/01/2014 1902   CL 109 06/01/2014 1902   CO2 28 06/01/2014 1902   GLUCOSE 97 06/01/2014 1902   BUN 14 06/01/2014 1902   CREATININE 1.00 06/01/2014 1902   CALCIUM 9.3 06/01/2014 1902   GFRNONAA 61* 06/01/2014 1902   GFRAA 71* 06/01/2014 1902    INR    Component Value Date/Time   INR 1.03 06/01/2014 1902     Intake/Output Summary (Last 24 hours) at 06/03/14 0830 Last data filed at 06/03/14 3790  Gross per 24 hour  Intake    840 ml  Output      0 ml  Net    840 ml     Assessment/Plan:  59 y.o. female is here with cellulitis left foot with ischemic changes to left 2nd toe Hospital Day 2  -pt states her left foot is about the same -continue IV ABx -possibly arteriogram later this week if infection continues to improve -further plan per Dr. Zada Girt, PA-C [facility] [phone] 06/03/2014 8:30 AM   I agree with the above.  The foot continues to look better.  I discussed that if she has continued improvement I will consider angiography tomorrow.  In the meantime, we'll continue  IV antibiotics and leg elevation.  She'll be nothing by mouth after midnight  Wells Brabham

## 2014-06-04 ENCOUNTER — Encounter (HOSPITAL_COMMUNITY)
Admission: AD | Disposition: A | Discharge status: Home Health | Payer: Self-pay | Source: Ambulatory Visit | Attending: Surgery

## 2014-06-04 ENCOUNTER — Encounter (HOSPITAL_COMMUNITY): Payer: Self-pay | Admitting: Surgery

## 2014-06-04 HISTORY — PX: LOWER EXTREMITY ANGIOGRAM: SHX5508

## 2014-06-04 LAB — GLUCOSE, CAPILLARY
GLUCOSE-CAPILLARY: 65 mg/dL — AB (ref 70–99)
GLUCOSE-CAPILLARY: 86 mg/dL (ref 70–99)
GLUCOSE-CAPILLARY: 75 mg/dL (ref 70–99)
GLUCOSE-CAPILLARY: 76 mg/dL (ref 70–99)
Glucose-Capillary: 104 mg/dL — ABNORMAL HIGH (ref 70–99)
Glucose-Capillary: 107 mg/dL — ABNORMAL HIGH (ref 70–99)
Glucose-Capillary: 74 mg/dL (ref 70–99)
Glucose-Capillary: 134 mg/dL — ABNORMAL HIGH (ref 70–99)

## 2014-06-04 LAB — BASIC METABOLIC PANEL
Anion gap: 5 (ref 5–15)
BUN: 13 mg/dL (ref 6–23)
CO2: 33 mmol/L — AB (ref 19–32)
Calcium: 9.2 mg/dL (ref 8.4–10.5)
Chloride: 103 mmol/L (ref 96–112)
Creatinine, Ser: 0.79 mg/dL (ref 0.50–1.10)
GFR calc Af Amer: >90 mL/min (ref >90)
GFR calc non Af Amer: >90 mL/min (ref >90)
Glucose, Bld: 102 mg/dL — ABNORMAL HIGH (ref 70–99)
Potassium: 4.3 mmol/L (ref 3.5–5.1)
Sodium: 141 mmol/L (ref 135–145)

## 2014-06-04 SURGERY — ANGIOGRAM, LOWER EXTREMITY
Anesthesia: LOCAL

## 2014-06-04 MED ORDER — METOPROLOL TARTRATE 1 MG/ML IV SOLN: 2.0000 mg | INTRAVENOUS | Status: DC | PRN

## 2014-06-04 MED ORDER — LABETALOL HCL 5 MG/ML IV SOLN
10.0000 mg | INTRAVENOUS | Status: DC | PRN
  Filled 2014-06-04: qty 4

## 2014-06-04 MED ORDER — ALUM & MAG HYDROXIDE-SIMETH 200-200-20 MG/5ML PO SUSP: 15.0000 mL | ORAL | Status: DC | PRN

## 2014-06-04 MED ORDER — MIDAZOLAM HCL 2 MG/2ML IJ SOLN
INTRAMUSCULAR | Status: AC
  Filled 2014-06-04: qty 2

## 2014-06-04 MED ORDER — ACETAMINOPHEN 650 MG RE SUPP: 325.0000 mg | RECTAL | Status: DC | PRN

## 2014-06-04 MED ORDER — LIDOCAINE HCL (PF) 1 % IJ SOLN
INTRAMUSCULAR | Status: AC
  Filled 2014-06-04: qty 30

## 2014-06-04 MED ORDER — ACETAMINOPHEN 325 MG PO TABS: 325.0000 mg | ORAL_TABLET | ORAL | Status: DC | PRN

## 2014-06-04 MED ORDER — HYDRALAZINE HCL 20 MG/ML IJ SOLN: 5.0000 mg | INTRAMUSCULAR | Status: DC | PRN

## 2014-06-04 MED ORDER — HEPARIN (PORCINE) IN NACL 2-0.9 UNIT/ML-% IJ SOLN
INTRAMUSCULAR | Status: AC
  Filled 2014-06-04: qty 1000

## 2014-06-04 MED ORDER — ONDANSETRON HCL 4 MG/2ML IJ SOLN
4.0000 mg | Freq: Four times a day (QID) | INTRAMUSCULAR | Status: DC | PRN
  Administered 2014-06-09 – 2014-06-13 (×3): 4 mg via INTRAVENOUS
  Filled 2014-06-04 (×4): qty 2

## 2014-06-04 MED ORDER — PHENOL 1.4 % MT LIQD
1.0000 | OROMUCOSAL | Status: DC | PRN
  Filled 2014-06-04: qty 177

## 2014-06-04 MED ORDER — FENTANYL CITRATE 0.05 MG/ML IJ SOLN
INTRAMUSCULAR | Status: AC
  Filled 2014-06-04: qty 2

## 2014-06-04 MED ORDER — SODIUM CHLORIDE 0.9 % IV SOLN
INTRAVENOUS | Status: DC
  Administered 2014-06-07: 23:00:00 via INTRAVENOUS

## 2014-06-04 MED ORDER — SODIUM CHLORIDE 0.9 % IV SOLN
INTRAVENOUS | Status: DC
  Administered 2014-06-04: 12:00:00 via INTRAVENOUS

## 2014-06-04 NOTE — Interval H&P Note (Signed)
History and Physical Interval Note:  06/04/2014 2:41 PM  Theresa Harrington  has presented today for surgery, with the diagnosis of pvd  The various methods of treatment have been discussed with the patient and family. After consideration of risks, benefits and other options for treatment, the patient has consented to  Procedure(s): LOWER EXTREMITY ANGIOGRAM (N/A) ABDOMINAL ANGIOGRAM as a surgical intervention .  The patient's history has been reviewed, patient examined, no change in status, stable for surgery.  I have reviewed the patient's chart and labs.  Questions were answered to the patient's satisfaction.     [name] IV, V. WELLS

## 2014-06-04 NOTE — H&P (View-Only) (Signed)
  Progress Note    06/03/2014 8:30 AM Hospital Day 2  Subjective:  States her foot is about the same  Afebrile HR 50's NSR 62'V-035'K systolic  09% RA  Abx:   Vanc Zosyn  Filed Vitals:   06/03/14 0411  BP: 132/60  Pulse: 52  Temp: 98.4 F (36.9 C)  Resp: 18    Physical Exam: Extremities:  Still with erythema of the dorsum of left foot; 2nd toe with black nailbed  CBC    Component Value Date/Time   WBC 13.7* 06/01/2014 1902   RBC 4.14 06/01/2014 1902   HGB 12.0 06/01/2014 1902   HCT 35.9* 06/01/2014 1902   PLT 263 06/01/2014 1902   MCV 86.7 06/01/2014 1902   MCH 29.0 06/01/2014 1902   MCHC 33.4 06/01/2014 1902   RDW 13.9 06/01/2014 1902   LYMPHSABS 2.7 10/28/2013 1141   MONOABS 0.5 10/28/2013 1141   EOSABS 0.2 10/28/2013 1141   BASOSABS 0.0 10/28/2013 1141    BMET    Component Value Date/Time   NA 143 06/01/2014 1902   K 3.3* 06/01/2014 1902   CL 109 06/01/2014 1902   CO2 28 06/01/2014 1902   GLUCOSE 97 06/01/2014 1902   BUN 14 06/01/2014 1902   CREATININE 1.00 06/01/2014 1902   CALCIUM 9.3 06/01/2014 1902   GFRNONAA 61* 06/01/2014 1902   GFRAA 71* 06/01/2014 1902    INR    Component Value Date/Time   INR 1.03 06/01/2014 1902     Intake/Output Summary (Last 24 hours) at 06/03/14 0830 Last data filed at 06/03/14 3818  Gross per 24 hour  Intake    840 ml  Output      0 ml  Net    840 ml     Assessment/Plan:  59 y.o. female is here with cellulitis left foot with ischemic changes to left 2nd toe Hospital Day 2  -pt states her left foot is about the same -continue IV ABx -possibly arteriogram later this week if infection continues to improve -further plan per Dr. Zada Girt, PA-C [facility] [phone] 06/03/2014 8:30 AM   I agree with the above.  The foot continues to look better.  I discussed that if she has continued improvement I will consider angiography tomorrow.  In the meantime, we'll continue  IV antibiotics and leg elevation.  She'll be nothing by mouth after midnight  Wells Brabham

## 2014-06-04 NOTE — Progress Notes (Signed)
06/04/2014 6:20 PM Nursing note Pt. Requesting to return to prior diet (carb modified) post arteriogram. Pt. Tolerated clear liquid dinner well. VS stable. Cath site stable without complication. Dr. Bridgett Larsson paged and made aware of above. Verbal order ok to give patient carb mod diet. Pt. Updated on plan of care. Will continue to monitor patient.  Daylan Boggess, Arville Lime

## 2014-06-04 NOTE — Op Note (Signed)
    Patient name: Theresa Harrington MRN: [id number] DOB: March 08, 1956 Sex: female  06/01/2014 - 06/04/2014 Pre-operative Diagnosis: Left foot ulcer Post-operative diagnosis:  Same Surgeon:  Eldridge Abrahams Procedure Performed:  1.  Ultrasound-guided access, right femoral artery  2.  Abdominal aortogram  3.  Bilateral lower extremity runoff  4.  Second order catheterization    Indications:  The patient underwent toenail removal.  She presented to the office this past Monday with cellulitis on her foot.  She was admitted for IV antibiotics.  She comes in for arterial evaluation.  Procedure:  The patient was identified in the holding area and taken to room 8.  The patient was then placed supine on the table and prepped and draped in the usual sterile fashion.  A time out was called.  Ultrasound was used to evaluate the right common femoral artery.  It was patent .  A digital ultrasound image was acquired.  A micropuncture needle was used to access the right common femoral artery under ultrasound guidance.  An 018 wire was advanced without resistance and a micropuncture sheath was placed.  The 018 wire was removed and a benson wire was placed.  The micropuncture sheath was exchanged for a 5 french sheath.  An omniflush catheter was advanced over the wire to the level of L-1.  An abdominal angiogram was obtained.  Next, using the omniflush catheter and a benson wire, the aortic bifurcation was crossed and the catheter was placed into theleft external iliac artery and left runoff was obtained.  right runoff was performed via retrograde sheath injections.  Findings:   Aortogram:  No significant renal artery stenosis is identified.  The infrarenal abdominal aorta is calcified but patent without stenosis.  The right common and external iliac arteries are calcified but patent.  There is a non-flow-limiting stenosis in the left common iliac artery.  Right Lower Extremity:  Calcific disease is noted in the right  common femoral artery with approximately 70% stenosis.  The profunda femoral artery is patent superficial femoral artery is occluded with reconstitution of the below knee popliteal artery and three-vessel runoff.  Left Lower Extremity:  The left common femoral artery is nearly occluded long segment calcified lesions.  The superficial femoral artery is occluded.  The profunda femoral artery is patent.  There is reconstitution of the below-knee popliteal artery.  There appears to be a focal narrowing at the distal end of the below knee popliteal artery and three-vessel runoff.  Intervention:  None  Impression:  #1  bilateral common femoral artery high-grade lesions.  #2  bilateral superficial femoral occlusion  #3  there is reconstitution of the below knee popliteal artery on the left with a stenosis at the distal tip with three-vessel runoff  #4  reconstitution of the right below-knee popliteal artery  #5  patient will be considered for left femoral endarterectomy with below knee popliteal artery bypass graft   V. [name], M.D. [facility] [place] Office: [phone] Pager:  [phone]

## 2014-06-04 NOTE — Progress Notes (Signed)
Inpatient Diabetes Program Recommendations  AACE/ADA: New Consensus Statement on Inpatient Glycemic Control (2013)  Target Ranges:  Prepandial:   less than 140 mg/dL      Peak postprandial:   less than 180 mg/dL (1-2 hours)      Critically ill patients:  140 - 180 mg/dL   Reason for Assessment:  Results for Theresa Harrington, Theresa Harrington (MRN [id number]) as of 06/04/2014 09:06  Ref. Range 06/03/2014 11:17 06/03/2014 16:16 06/03/2014 21:23 06/04/2014 06:20 06/04/2014 07:25  Glucose-Capillary Latest Range: 70-99 mg/dL 101 (H) 127 (H) 104 (H) 65 (L) 86    Diabetes history: Diabetes with PVD Outpatient Diabetes medications: Levemir 30 units daily, Metformin 1000 mg bid, Invokana 300 mg daily Current orders for Inpatient glycemic control:  Novolog moderate tid with meals, Metformin 1000 mg bid, Invokana 300 mg daily, Levemir 30 units daily  May consider holding Invokana while patient is in the hospital.  Also may need slight decrease in Levemir to 25 units daily.  Thanks, Adah Perl, RN, BC-ADM Inpatient Diabetes Coordinator Pager [phone]

## 2014-06-04 NOTE — Progress Notes (Signed)
Site area: rt groin Site Prior to Removal:  Level 0 Pressure Applied For: 20 minutes Manual:   yes Patient Status During Pull:  stable Post Pull Site:  Level  0 Post Pull Instructions Given:  yes Post Pull Pulses Present: yes Dressing Applied:  tegaderm Bedrest begins @ 3437 Comments:  0 complications

## 2014-06-04 NOTE — Progress Notes (Signed)
ANTIBIOTIC CONSULT NOTE - FOLLOW UP  Pharmacy Consult for Vancomycin Indication: left foot infection  Allergies  Allergen Reactions  . Amlodipine     edema  . Atorvastatin     muscle aches  . Ampicillin Nausea And Vomiting  . Codeine Nausea And Vomiting  . Lisinopril Other (See Comments)     cough  . Penicillins Nausea And Vomiting  . Januvia [Sitagliptin]     nausea    Patient Measurements: Height: 5\' 5"  (021.1 cm) Weight: 169 lb (76.658 kg) IBW/kg (Calculated) : 57  Vital Signs: Temp: 97.8 F (36.6 C) (02/04 1346) Temp Source: Oral (02/04 1346) BP: 156/61 mmHg (02/04 1346) Pulse Rate: 54 (02/04 1346) Intake/Output from previous day: 02/03 0701 - 02/04 0700 In: 970 [P.O.:720; IV Piggyback:250] Out: -  Intake/Output from this shift:    Labs:  Recent Labs  06/01/14 1902 06/04/14 0915  WBC 13.7*  --   HGB 12.0  --   PLT 263  --   CREATININE 1.00 0.79   Estimated Creatinine Clearance: 79.5 mL/min (by C-G formula based on Cr of 0.79).   Microbiology: No results found for this or any previous visit (from the past 720 hour(s)).  Assessment: 58yof continues on day#3 vancomycin and zosyn for left foot cellulitis with ischemic changes to her 2nd toe. Renal function has been stable. She is going for angiography today.   Vancomycin 2/2>> Zosyn 2/1>> No cultures  Goal of Therapy:  Vancomycin trough level 10-15 mcg/ml  Plan:  1) Continue vancomycin 1g IV q12 - check trough tonight 2) Continue zosyn 3.375g IV q8 (4 hour infusion)  Deboraha Sprang 06/04/2014,1:49 PM

## 2014-06-05 DIAGNOSIS — Z01810 Encounter for preprocedural cardiovascular examination: Secondary | ICD-10-CM

## 2014-06-05 LAB — BASIC METABOLIC PANEL
Anion gap: 7 (ref 5–15)
BUN: 11 mg/dL (ref 6–23)
CO2: 31 mmol/L (ref 19–32)
CREATININE: 0.8 mg/dL (ref 0.50–1.10)
Calcium: 9.3 mg/dL (ref 8.4–10.5)
Chloride: 103 mmol/L (ref 96–112)
GFR calc Af Amer: >90 mL/min (ref >90)
GFR calc non Af Amer: 80 mL/min — ABNORMAL LOW (ref >90)
Glucose, Bld: 112 mg/dL — ABNORMAL HIGH (ref 70–99)
Potassium: 4.2 mmol/L (ref 3.5–5.1)
Sodium: 141 mmol/L (ref 135–145)

## 2014-06-05 LAB — GLUCOSE, CAPILLARY
GLUCOSE-CAPILLARY: 159 mg/dL — AB (ref 70–99)
GLUCOSE-CAPILLARY: 207 mg/dL — AB (ref 70–99)
Glucose-Capillary: 94 mg/dL (ref 70–99)
Glucose-Capillary: 233 mg/dL — ABNORMAL HIGH (ref 70–99)

## 2014-06-05 MED ORDER — MORPHINE SULFATE 4 MG/ML IJ SOLN
3.0000 mg | Freq: Once | INTRAMUSCULAR | Status: AC
  Administered 2014-06-05: 3 mg via INTRAVENOUS
  Filled 2014-06-05: qty 1

## 2014-06-05 MED ORDER — MORPHINE SULFATE 2 MG/ML IJ SOLN
1.0000 mg | INTRAMUSCULAR | Status: DC | PRN
  Administered 2014-06-07 – 2014-06-12 (×13): 1 mg via INTRAVENOUS
  Filled 2014-06-05 (×14): qty 1

## 2014-06-05 MED ORDER — OXYCODONE HCL 5 MG PO TABS
5.0000 mg | ORAL_TABLET | Freq: Four times a day (QID) | ORAL | Status: DC | PRN
  Filled 2014-06-05 (×2): qty 2

## 2014-06-05 NOTE — Progress Notes (Signed)
  VASCULAR LAB PRELIMINARY  PRELIMINARY  PRELIMINARY  PRELIMINARY   Right Lower Extremity Vein Map    Right Great Saphenous Vein   Segment Diameter Comment  1. Origin 7.31mm   2. High Thigh 2.63mm   3. Mid Thigh 2.9mm   4. Low Thigh 2.29mm   5. At Knee 2.1mm   6. High Calf 2.13mm branch  7. Low Calf 1.73mm   8. Ankle 2.56mm Branch rejoins into GSV   mm    mm    mm     Right Small Saphenous Vein  Segment Diameter Comment  1. Origin 2.20mm   2. High Calf 1.39mm   3. Low Calf 2.71mm   4. Ankle 1.22mm branch   mm    mm    mm      Left Lower Extremity Vein Map    Left Great Saphenous Vein   Segment Diameter Comment  1. Origin 3.58mm   2. High Thigh 2.13mm   3. Mid Thigh 1.63mm branch  4. Low Thigh 2.75mm   5. At Knee 2.56mm   6. High Calf 2.46mm   7. Low Calf 2.80mm branch  8. Ankle 3.72mm    mm    mm    mm     Left Small Saphenous Vein  Segment Diameter Comment  1. Origin 1.72mm   2. High Calf 1.10mm   3. Low Calf 1.45mm branch  4. Ankle 2.58mm    mm    mm    mm     Landry Mellow, RDMS, RVT  06/05/2014, 3:59 PM

## 2014-06-05 NOTE — Progress Notes (Signed)
    Subjective  - POD #1, status post lower extremity angiogram  No complaints overnight   Physical Exam:  Left foot continues to have improved appearance of the erythema. Respirations nonlabored Abdomen and groin cannulation site or soft Pedal pulses are not palpable    Assessment/Plan:  POD #1  I discussed the angiogram findings with the patient.  She has severe occlusive disease in the left common femoral artery and an occluded superficial femoral and above-knee popliteal artery.  I discussed with her proceeding with left femoral to below knee popliteal artery bypass graft.  She has vein mapping pending.  The earliest available date I have is Friday.  I discussed the possibility of one of my partners doing the operation at an earlier date.  She is going to consider this.  The patient denies having any chest pain and has never had a cardiac history, therefore do not think preoperative cardiac clearance is warranted.  [place] 06/05/2014 2:50 PM --  Filed Vitals:   06/05/14 1408  BP: 127/57  Pulse: 51  Temp: 98.5 F (36.9 C)  Resp: 18    Intake/Output Summary (Last 24 hours) at 06/05/14 1450 Last data filed at 06/05/14 1300  Gross per 24 hour  Intake    120 ml  Output    200 ml  Net    -80 ml     Laboratory CBC    Component Value Date/Time   WBC 13.7* 06/01/2014 1902   HGB 12.0 06/01/2014 1902   HCT 35.9* 06/01/2014 1902   PLT 263 06/01/2014 1902    BMET    Component Value Date/Time   NA 141 06/05/2014 0530   K 4.2 06/05/2014 0530   CL 103 06/05/2014 0530   CO2 31 06/05/2014 0530   GLUCOSE 112* 06/05/2014 0530   BUN 11 06/05/2014 0530   CREATININE 0.80 06/05/2014 0530   CALCIUM 9.3 06/05/2014 0530   GFRNONAA 80* 06/05/2014 0530   GFRAA >90 06/05/2014 0530    COAG Lab Results  Component Value Date   INR 1.03 06/01/2014   No results found for: PTT  Antibiotics Anti-infectives    Start     Dose/Rate Route Frequency Ordered Stop   06/01/14 2200  piperacillin-tazobactam (ZOSYN) IVPB 3.375 g  Status:  Discontinued     3.375 g12.5 mL/hr over 240 Minutes Intravenous 3 times per day 06/01/14 1814 06/04/14 1618   06/01/14 2000  vancomycin (VANCOCIN) IVPB 1000 mg/200 mL premix  Status:  Discontinued     1,000 mg200 mL/hr over 60 Minutes Intravenous Every 12 hours 06/01/14 1904 06/04/14 1640       V. Leia Alf, M.D. [facility] [place] Office: [phone] Pager:  [phone]

## 2014-06-06 DIAGNOSIS — I70245 Atherosclerosis of native arteries of left leg with ulceration of other part of foot: Secondary | ICD-10-CM

## 2014-06-06 LAB — GLUCOSE, CAPILLARY
GLUCOSE-CAPILLARY: 108 mg/dL — AB (ref 70–99)
GLUCOSE-CAPILLARY: 138 mg/dL — AB (ref 70–99)
GLUCOSE-CAPILLARY: 181 mg/dL — AB (ref 70–99)
Glucose-Capillary: 147 mg/dL — ABNORMAL HIGH (ref 70–99)

## 2014-06-06 NOTE — Progress Notes (Signed)
Report received from [place], [place]. Kathleen Argue S 3:11 PM

## 2014-06-06 NOTE — Progress Notes (Signed)
Subjective: Interval History: none.. Still with pain in her left foot but this is tolerable  Objective: Vital signs in last 24 hours: Temp:  [98 F (36.7 C)-98.5 F (36.9 C)] 98 F (36.7 C) (02/06 0443) Pulse Rate:  [50-57] 50 (02/06 0443) Resp:  [16-19] 16 (02/06 0443) BP: (114-127)/(56-64) 123/64 mmHg (02/06 0443) SpO2:  [98 %-100 %] 98 % (02/06 0443)  Intake/Output from previous day: 02/05 0701 - 02/06 0700 In: 120 [P.O.:120] Out: -  Intake/Output this shift: Total I/O In: 240 [P.O.:240] Out: -   Continued erythema over the dorsum of her foot. Some blistering in the webspace between her second and third toe. 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 Palpable left femoral pulse.  Lab Results: No results for input(s): WBC, HGB, HCT, PLT in the last 72 hours. BMET  Recent Labs  06/04/14 0915 06/05/14 0530  NA 141 141  K 4.3 4.2  CL 103 103  CO2 33* 31  GLUCOSE 102* 112*  BUN 13 11  CREATININE 0.79 0.80  CALCIUM 9.2 9.3    Studies/Results: No results found. Anti-infectives: Anti-infectives    Start     Dose/Rate Route Frequency Ordered Stop   06/01/14 2200  piperacillin-tazobactam (ZOSYN) IVPB 3.375 g  Status:  Discontinued     3.375 g12.5 mL/hr over 240 Minutes Intravenous 3 times per day 06/01/14 1814 06/04/14 1618   06/01/14 2000  vancomycin (VANCOCIN) IVPB 1000 mg/200 mL premix  Status:  Discontinued     1,000 mg200 mL/hr over 60 Minutes Intravenous Every 12 hours 06/01/14 1904 06/04/14 1640      Assessment/Plan: s/p Procedure(s): LOWER EXTREMITY ANGIOGRAM (N/A) ABDOMINAL ANGIOGRAM Had long discussion with the patient regarding her lower extremity arterial insufficiency. I did review her arteriogram. She does have the extensive plaque in her common femoral artery with minimal flow into the deep femoral artery resulting from this. 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 Agree with recommendation for left common femoral endarterectomy and left femoral to below-knee popliteal bypass. Patient understands that Dr. Trula Slade cannot get this on elective schedule until almost 1 week. I am available for Monday morning for surgery. She is comfortable with me proceeding with the bypass. I explained the procedure including approximate 5% per year risk of occlusion of the bypass. This is certainly limb threatening if she does not have revascularization. Also explained that I am not sure what type of resolution she will have with her foot with the area of blistering between the second and third web space. Will continue antibiotics and plan for surgery on Monday morning.   LOS: 5 days   Theresa Harrington 06/06/2014, 12:20 PM

## 2014-06-07 LAB — GLUCOSE, CAPILLARY
GLUCOSE-CAPILLARY: 89 mg/dL (ref 70–99)
Glucose-Capillary: 159 mg/dL — ABNORMAL HIGH (ref 70–99)
Glucose-Capillary: 84 mg/dL (ref 70–99)
Glucose-Capillary: 126 mg/dL — ABNORMAL HIGH (ref 70–99)

## 2014-06-07 LAB — SURGICAL PCR SCREEN
MRSA, PCR: NEGATIVE
Staphylococcus aureus: NEGATIVE

## 2014-06-07 NOTE — Progress Notes (Signed)
Subjective: Interval History: none.. Comfortable. Less discomfort in her foot.  Objective: Vital signs in last 24 hours: Temp:  [98 F (36.7 C)-98.5 F (36.9 C)] 98.5 F (36.9 C) (02/07 0444) Pulse Rate:  [50-57] 50 (02/07 0444) Resp:  [17-18] 17 (02/07 0444) BP: (107-128)/(40-62) 128/40 mmHg (02/07 0444) SpO2:  [95 %-97 %] 95 % (02/07 0444)  Intake/Output from previous day: 02/06 0701 - 02/07 0700 In: 1120 [P.O.:720; I.V.:400] Out: -  Intake/Output this shift:    Marked resolution and erythema on dorsum of her left foot. Continued dry gangrenous changes and duskiness of her second toe.  Lab Results: No results for input(s): WBC, HGB, HCT, PLT in the last 72 hours. BMET  Recent Labs  06/04/14 0915 06/05/14 0530  NA 141 141  K 4.3 4.2  CL 103 103  CO2 33* 31  GLUCOSE 102* 112*  BUN 13 11  CREATININE 0.79 0.80  CALCIUM 9.2 9.3    Studies/Results: No results found. Anti-infectives: Anti-infectives    Start     Dose/Rate Route Frequency Ordered Stop   06/01/14 2200  piperacillin-tazobactam (ZOSYN) IVPB 3.375 g  Status:  Discontinued     3.375 g12.5 mL/hr over 240 Minutes Intravenous 3 times per day 06/01/14 1814 06/04/14 1618   06/01/14 2000  vancomycin (VANCOCIN) IVPB 1000 mg/200 mL premix  Status:  Discontinued     1,000 mg200 mL/hr over 60 Minutes Intravenous Every 12 hours 06/01/14 1904 06/04/14 1640      Assessment/Plan: s/p Procedure(s): LOWER EXTREMITY ANGIOGRAM (N/A) ABDOMINAL ANGIOGRAM 4 left common femoral endarterectomy and left femoral-popliteal bypass tomorrow. Again discussed may lose her second toe which is very upsetting to the patient.   LOS: 6 days   Micharl Helmes 06/07/2014, 8:56 AM

## 2014-06-08 ENCOUNTER — Inpatient Hospital Stay (HOSPITAL_COMMUNITY): Payer: 59 | Admitting: Anesthesiology

## 2014-06-08 ENCOUNTER — Encounter (HOSPITAL_COMMUNITY)
Admission: AD | Disposition: A | Discharge status: Home Health | Payer: Self-pay | Source: Ambulatory Visit | Attending: Surgery

## 2014-06-08 ENCOUNTER — Encounter (HOSPITAL_COMMUNITY): Payer: Self-pay | Admitting: Anesthesiology

## 2014-06-08 HISTORY — PX: ENDARTERECTOMY FEMORAL: SHX5804

## 2014-06-08 HISTORY — PX: FEMORAL-POPLITEAL BYPASS GRAFT: SHX937

## 2014-06-08 LAB — CBC
HCT: 33.6 % — ABNORMAL LOW (ref 36.0–46.0)
Hemoglobin: 11 g/dL — ABNORMAL LOW (ref 12.0–15.0)
MCH: 29 pg (ref 26.0–34.0)
MCHC: 32.7 g/dL (ref 30.0–36.0)
MCV: 88.7 fL (ref 78.0–100.0)
Platelets: 276 10*3/uL (ref 150–400)
RBC: 3.79 MIL/uL — AB (ref 3.87–5.11)
RDW: 13.8 % (ref 11.5–15.5)
WBC: 16.5 10*3/uL — ABNORMAL HIGH (ref 4.0–10.5)

## 2014-06-08 LAB — CREATININE, SERUM
CREATININE: 0.96 mg/dL (ref 0.50–1.10)
Creatinine, Ser: 0.91 mg/dL (ref 0.50–1.10)
GFR calc Af Amer: 75 mL/min — ABNORMAL LOW (ref >90)
GFR calc Af Amer: 80 mL/min — ABNORMAL LOW (ref >90)
GFR calc non Af Amer: 64 mL/min — ABNORMAL LOW (ref >90)
GFR calc non Af Amer: 69 mL/min — ABNORMAL LOW (ref >90)

## 2014-06-08 LAB — GLUCOSE, CAPILLARY: GLUCOSE-CAPILLARY: 126 mg/dL — AB (ref 70–99)

## 2014-06-08 SURGERY — ENDARTERECTOMY, FEMORAL
Anesthesia: General | Site: Leg Upper | Laterality: Left

## 2014-06-08 MED ORDER — LIDOCAINE HCL (CARDIAC) 20 MG/ML IV SOLN
INTRAVENOUS | Status: AC
  Filled 2014-06-08: qty 5

## 2014-06-08 MED ORDER — ONDANSETRON HCL 4 MG/2ML IJ SOLN
INTRAMUSCULAR | Status: DC | PRN
  Administered 2014-06-08: 4 mg via INTRAVENOUS

## 2014-06-08 MED ORDER — GLYCOPYRROLATE 0.2 MG/ML IJ SOLN
INTRAMUSCULAR | Status: DC | PRN
  Administered 2014-06-08: 0.4 mg via INTRAVENOUS

## 2014-06-08 MED ORDER — DEXTROSE 5 % IV SOLN
INTRAVENOUS | Status: DC | PRN
  Administered 2014-06-08: 07:00:00 via INTRAVENOUS

## 2014-06-08 MED ORDER — ONDANSETRON HCL 4 MG/2ML IJ SOLN
INTRAMUSCULAR | Status: AC
  Filled 2014-06-08: qty 2

## 2014-06-08 MED ORDER — PROTAMINE SULFATE 10 MG/ML IV SOLN
INTRAVENOUS | Status: DC | PRN
Start: 2014-06-08 — End: 2014-06-08
  Administered 2014-06-08: 50 mg via INTRAVENOUS

## 2014-06-08 MED ORDER — PROPOFOL 10 MG/ML IV BOLUS
INTRAVENOUS | Status: AC
  Filled 2014-06-08: qty 20

## 2014-06-08 MED ORDER — EPHEDRINE SULFATE 50 MG/ML IJ SOLN
INTRAMUSCULAR | Status: AC
  Filled 2014-06-08: qty 1

## 2014-06-08 MED ORDER — ROCURONIUM BROMIDE 50 MG/5ML IV SOLN
INTRAVENOUS | Status: AC
  Filled 2014-06-08: qty 1

## 2014-06-08 MED ORDER — SODIUM CHLORIDE 0.9 % IV SOLN: 500.0000 mL | Freq: Once | INTRAVENOUS | Status: AC | PRN

## 2014-06-08 MED ORDER — SODIUM CHLORIDE 0.9 % IJ SOLN
INTRAMUSCULAR | Status: AC
  Filled 2014-06-08: qty 10

## 2014-06-08 MED ORDER — HEPARIN SODIUM (PORCINE) 5000 UNIT/ML IJ SOLN
INTRAMUSCULAR | Status: DC | PRN
  Administered 2014-06-08: 500 mL

## 2014-06-08 MED ORDER — EPHEDRINE SULFATE 50 MG/ML IJ SOLN
INTRAMUSCULAR | Status: DC | PRN
  Administered 2014-06-08: 10 mg via INTRAVENOUS
  Administered 2014-06-08 (×3): 5 mg via INTRAVENOUS

## 2014-06-08 MED ORDER — MIDAZOLAM HCL 5 MG/5ML IJ SOLN
INTRAMUSCULAR | Status: DC | PRN
  Administered 2014-06-08: 2 mg via INTRAVENOUS

## 2014-06-08 MED ORDER — ROCURONIUM BROMIDE 100 MG/10ML IV SOLN
INTRAVENOUS | Status: DC | PRN
  Administered 2014-06-08: 50 mg via INTRAVENOUS

## 2014-06-08 MED ORDER — FENTANYL CITRATE 0.05 MG/ML IJ SOLN
INTRAMUSCULAR | Status: AC
  Filled 2014-06-08: qty 5

## 2014-06-08 MED ORDER — PROMETHAZINE HCL 25 MG/ML IJ SOLN: 6.2500 mg | INTRAMUSCULAR | Status: DC | PRN

## 2014-06-08 MED ORDER — SODIUM CHLORIDE 0.9 % IV SOLN
INTRAVENOUS | Status: DC
  Administered 2014-06-08: 17:00:00 via INTRAVENOUS

## 2014-06-08 MED ORDER — SUCCINYLCHOLINE CHLORIDE 20 MG/ML IJ SOLN
INTRAMUSCULAR | Status: AC
  Filled 2014-06-08: qty 1

## 2014-06-08 MED ORDER — MEPERIDINE HCL 25 MG/ML IJ SOLN: 6.2500 mg | INTRAMUSCULAR | Status: DC | PRN

## 2014-06-08 MED ORDER — DEXAMETHASONE SODIUM PHOSPHATE 4 MG/ML IJ SOLN
INTRAMUSCULAR | Status: AC
  Filled 2014-06-08: qty 1

## 2014-06-08 MED ORDER — MIDAZOLAM HCL 2 MG/2ML IJ SOLN: 0.5000 mg | Freq: Once | INTRAMUSCULAR | Status: DC | PRN

## 2014-06-08 MED ORDER — HYDROMORPHONE HCL 1 MG/ML IJ SOLN
INTRAMUSCULAR | Status: AC
  Filled 2014-06-08: qty 1

## 2014-06-08 MED ORDER — LACTATED RINGERS IV SOLN
INTRAVENOUS | Status: DC | PRN
  Administered 2014-06-08 (×3): via INTRAVENOUS

## 2014-06-08 MED ORDER — MIDAZOLAM HCL 2 MG/2ML IJ SOLN
INTRAMUSCULAR | Status: AC
  Filled 2014-06-08: qty 2

## 2014-06-08 MED ORDER — FENTANYL CITRATE 0.05 MG/ML IJ SOLN
INTRAMUSCULAR | Status: DC | PRN
  Administered 2014-06-08: 250 ug via INTRAVENOUS
  Administered 2014-06-08 (×3): 50 ug via INTRAVENOUS
  Administered 2014-06-08 (×2): 25 ug via INTRAVENOUS

## 2014-06-08 MED ORDER — MAGNESIUM SULFATE 2 GM/50ML IV SOLN
2.0000 g | Freq: Every day | INTRAVENOUS | Status: DC | PRN
  Filled 2014-06-08: qty 50

## 2014-06-08 MED ORDER — PHENYLEPHRINE 40 MCG/ML (10ML) SYRINGE FOR IV PUSH (FOR BLOOD PRESSURE SUPPORT)
PREFILLED_SYRINGE | INTRAVENOUS | Status: AC
  Filled 2014-06-08: qty 10

## 2014-06-08 MED ORDER — VANCOMYCIN HCL IN DEXTROSE 1-5 GM/200ML-% IV SOLN
INTRAVENOUS | Status: AC
  Filled 2014-06-08: qty 200

## 2014-06-08 MED ORDER — HEPARIN SODIUM (PORCINE) 1000 UNIT/ML IJ SOLN
INTRAMUSCULAR | Status: DC | PRN
  Administered 2014-06-08: 7000 [IU] via INTRAVENOUS
  Administered 2014-06-08: 2000 [IU] via INTRAVENOUS

## 2014-06-08 MED ORDER — ENOXAPARIN SODIUM 40 MG/0.4ML ~~LOC~~ SOLN
40.0000 mg | SUBCUTANEOUS | Status: DC
  Administered 2014-06-09: 40 mg via SUBCUTANEOUS
  Filled 2014-06-08: qty 0.4

## 2014-06-08 MED ORDER — LIDOCAINE HCL (CARDIAC) 20 MG/ML IV SOLN
INTRAVENOUS | Status: DC | PRN
  Administered 2014-06-08: 20 mg via INTRAVENOUS

## 2014-06-08 MED ORDER — DOCUSATE SODIUM 100 MG PO CAPS
100.0000 mg | ORAL_CAPSULE | Freq: Every day | ORAL | Status: DC
  Administered 2014-06-09 – 2014-06-10 (×2): 100 mg via ORAL
  Filled 2014-06-08 (×4): qty 1

## 2014-06-08 MED ORDER — PROPOFOL 10 MG/ML IV BOLUS
INTRAVENOUS | Status: DC | PRN
  Administered 2014-06-08: 130 mg via INTRAVENOUS

## 2014-06-08 MED ORDER — GLYCOPYRROLATE 0.2 MG/ML IJ SOLN
INTRAMUSCULAR | Status: AC
  Filled 2014-06-08: qty 1

## 2014-06-08 MED ORDER — HEPARIN SODIUM (PORCINE) 1000 UNIT/ML IJ SOLN
INTRAMUSCULAR | Status: AC
  Filled 2014-06-08: qty 1

## 2014-06-08 MED ORDER — VANCOMYCIN HCL IN DEXTROSE 1-5 GM/200ML-% IV SOLN
1000.0000 mg | Freq: Once | INTRAVENOUS | Status: AC
  Administered 2014-06-08: 1000 mg via INTRAVENOUS

## 2014-06-08 MED ORDER — 0.9 % SODIUM CHLORIDE (POUR BTL) OPTIME
TOPICAL | Status: DC | PRN
Start: 2014-06-08 — End: 2014-06-08
  Administered 2014-06-08 (×2): 1000 mL

## 2014-06-08 MED ORDER — DEXAMETHASONE SODIUM PHOSPHATE 4 MG/ML IJ SOLN
INTRAMUSCULAR | Status: DC | PRN
  Administered 2014-06-08: 4 mg via INTRAVENOUS

## 2014-06-08 MED ORDER — BISACODYL 10 MG RE SUPP: 10.0000 mg | Freq: Every day | RECTAL | Status: DC | PRN

## 2014-06-08 MED ORDER — POTASSIUM CHLORIDE CRYS ER 20 MEQ PO TBCR: 20.0000 meq | EXTENDED_RELEASE_TABLET | Freq: Every day | ORAL | Status: DC | PRN

## 2014-06-08 MED ORDER — VANCOMYCIN HCL IN DEXTROSE 1-5 GM/200ML-% IV SOLN
1000.0000 mg | Freq: Two times a day (BID) | INTRAVENOUS | Status: AC
  Administered 2014-06-08 – 2014-06-09 (×2): 1000 mg via INTRAVENOUS
  Filled 2014-06-08 (×2): qty 200

## 2014-06-08 MED ORDER — HYDROMORPHONE HCL 1 MG/ML IJ SOLN
0.2500 mg | INTRAMUSCULAR | Status: DC | PRN
  Administered 2014-06-08 (×2): 0.5 mg via INTRAVENOUS

## 2014-06-08 MED ORDER — NEOSTIGMINE METHYLSULFATE 10 MG/10ML IV SOLN
INTRAVENOUS | Status: DC | PRN
  Administered 2014-06-08: 3 mg via INTRAVENOUS

## 2014-06-08 MED ORDER — SCOPOLAMINE 1 MG/3DAYS TD PT72
MEDICATED_PATCH | TRANSDERMAL | Status: AC
  Administered 2014-06-08: 1 via TRANSDERMAL
  Filled 2014-06-08: qty 1

## 2014-06-08 SURGICAL SUPPLY — 63 items
APL SKNCLS STERI-STRIP NONHPOA (GAUZE/BANDAGES/DRESSINGS) ×1
BANDAGE ESMARK 6X9 LF (GAUZE/BANDAGES/DRESSINGS) IMPLANT
BENZOIN TINCTURE PRP APPL 2/3 (GAUZE/BANDAGES/DRESSINGS) ×3 IMPLANT
BNDG CMPR 9X6 STRL LF SNTH (GAUZE/BANDAGES/DRESSINGS) ×1
BNDG ESMARK 6X9 LF (GAUZE/BANDAGES/DRESSINGS) ×3
CANISTER SUCTION 2500CC (MISCELLANEOUS) ×3 IMPLANT
CANNULA VESSEL 3MM 2 BLNT TIP (CANNULA) ×6 IMPLANT
CLIP LIGATING EXTRA MED SLVR (CLIP) ×5 IMPLANT
CLIP LIGATING EXTRA SM BLUE (MISCELLANEOUS) ×5 IMPLANT
CLOSURE WOUND 1/2 X4 (GAUZE/BANDAGES/DRESSINGS) ×1
CUFF TOURNIQUET SINGLE 18IN (TOURNIQUET CUFF) IMPLANT
CUFF TOURNIQUET SINGLE 24IN (TOURNIQUET CUFF) ×2 IMPLANT
CUFF TOURNIQUET SINGLE 34IN LL (TOURNIQUET CUFF) IMPLANT
CUFF TOURNIQUET SINGLE 44IN (TOURNIQUET CUFF) IMPLANT
DRAIN SNY 10X20 3/4 PERF (WOUND CARE) IMPLANT
DRAPE X-RAY CASS 24X20 (DRAPES) IMPLANT
DRSG COVADERM 4X10 (GAUZE/BANDAGES/DRESSINGS) ×2 IMPLANT
DRSG COVADERM 4X8 (GAUZE/BANDAGES/DRESSINGS) ×2 IMPLANT
ELECT REM PT RETURN 9FT ADLT (ELECTROSURGICAL) ×3
ELECTRODE REM PT RTRN 9FT ADLT (ELECTROSURGICAL) ×1 IMPLANT
EVACUATOR SILICONE 100CC (DRAIN) IMPLANT
GAUZE SPONGE 4X4 12PLY STRL (GAUZE/BANDAGES/DRESSINGS) ×3 IMPLANT
GLOVE BIO SURGEON STRL SZ 6.5 (GLOVE) ×2 IMPLANT
GLOVE BIO SURGEONS STRL SZ 6.5 (GLOVE) ×2
GLOVE BIOGEL PI IND STRL 6.5 (GLOVE) IMPLANT
GLOVE BIOGEL PI INDICATOR 6.5 (GLOVE) ×6
GLOVE ECLIPSE 7.5 STRL STRAW (GLOVE) ×6 IMPLANT
GLOVE SS BIOGEL STRL SZ 7.5 (GLOVE) ×1 IMPLANT
GLOVE SUPERSENSE BIOGEL SZ 7.5 (GLOVE) ×2
GOWN STRL REUS W/ TWL LRG LVL3 (GOWN DISPOSABLE) ×3 IMPLANT
GOWN STRL REUS W/ TWL XL LVL3 (GOWN DISPOSABLE) IMPLANT
GOWN STRL REUS W/TWL LRG LVL3 (GOWN DISPOSABLE) ×9
GOWN STRL REUS W/TWL XL LVL3 (GOWN DISPOSABLE) ×6
INSERT FOGARTY SM (MISCELLANEOUS) IMPLANT
KIT BASIN OR (CUSTOM PROCEDURE TRAY) ×3 IMPLANT
KIT ROOM TURNOVER OR (KITS) ×3 IMPLANT
NS IRRIG 1000ML POUR BTL (IV SOLUTION) ×6 IMPLANT
PACK PERIPHERAL VASCULAR (CUSTOM PROCEDURE TRAY) ×3 IMPLANT
PAD ARMBOARD 7.5X6 YLW CONV (MISCELLANEOUS) ×6 IMPLANT
PADDING CAST COTTON 6X4 STRL (CAST SUPPLIES) ×2 IMPLANT
PATCH HEMASHIELD 8X75 (Vascular Products) ×2 IMPLANT
PROBE PENCIL 8 MHZ STRL DISP (MISCELLANEOUS) ×2 IMPLANT
SET COLLECT BLD 21X3/4 12 (NEEDLE) IMPLANT
STAPLER VISISTAT 35W (STAPLE) IMPLANT
STOPCOCK 4 WAY LG BORE MALE ST (IV SETS) IMPLANT
STRIP CLOSURE SKIN 1/2X4 (GAUZE/BANDAGES/DRESSINGS) ×2 IMPLANT
SUT ETHILON 3 0 PS 1 (SUTURE) IMPLANT
SUT PROLENE 5 0 C 1 24 (SUTURE) ×5 IMPLANT
SUT PROLENE 6 0 CC (SUTURE) ×15 IMPLANT
SUT SILK 2 0 SH (SUTURE) ×3 IMPLANT
SUT SILK 3 0 (SUTURE) ×3
SUT SILK 3-0 18XBRD TIE 12 (SUTURE) IMPLANT
SUT SILK 4 0 (SUTURE) ×3
SUT SILK 4-0 18XBRD TIE 12 (SUTURE) IMPLANT
SUT VIC AB 2-0 CTX 36 (SUTURE) ×6 IMPLANT
SUT VIC AB 3-0 SH 27 (SUTURE) ×9
SUT VIC AB 3-0 SH 27X BRD (SUTURE) ×2 IMPLANT
SUT VIC AB 4-0 PS2 27 (SUTURE) ×2 IMPLANT
TRAY FOLEY CATH 16FRSI W/METER (SET/KITS/TRAYS/PACK) ×3 IMPLANT
TUBING EXTENTION W/L.L. (IV SETS) IMPLANT
TUBING IRRIGATION (MISCELLANEOUS) ×2 IMPLANT
UNDERPAD 30X30 INCONTINENT (UNDERPADS AND DIAPERS) ×3 IMPLANT
WATER STERILE IRR 1000ML POUR (IV SOLUTION) ×3 IMPLANT

## 2014-06-08 NOTE — Progress Notes (Signed)
Lunch relief by MA July Nickson RN 

## 2014-06-08 NOTE — Anesthesia Postprocedure Evaluation (Signed)
  Anesthesia Post-op Note  Patient: Theresa Harrington  Procedure(s) Performed: Procedure(s): Left Leg Common Femoral and External Iliac  Endartarectomy with patch Angioplasty (Left) Left Leg Femoral -Popliteal Bypass Graft (Left)  Patient Location: PACU  Anesthesia Type:General  Level of Consciousness: awake, alert , oriented and patient cooperative  Airway and Oxygen Therapy: Patient Spontanous Breathing and Patient connected to nasal cannula oxygen  Post-op Pain: mild  Post-op Assessment: Post-op Vital signs reviewed, Patient's Cardiovascular Status Stable, Respiratory Function Stable, Patent Airway, No signs of Nausea or vomiting and Pain level controlled  Post-op Vital Signs: Reviewed and stable  Last Vitals:  Filed Vitals:   06/08/14 1642  BP:   Pulse: 60  Temp: 36.6 C  Resp: 16    Complications: No apparent anesthesia complications

## 2014-06-08 NOTE — Transfer of Care (Signed)
Immediate Anesthesia Transfer of Care Note  Patient: Theresa Harrington  Procedure(s) Performed: Procedure(s): Left Leg Common Femoral and External Iliac  Endartarectomy with patch Angioplasty (Left) Left Leg Femoral -Popliteal Bypass Graft (Left)  Patient Location: PACU  Anesthesia Type:General  Level of Consciousness: awake, alert , oriented and patient cooperative  Airway & Oxygen Therapy: Patient Spontanous Breathing and Patient connected to nasal cannula oxygen  Post-op Assessment: Report given to RN and Post -op Vital signs reviewed and stable  Post vital signs: Reviewed  Last Vitals:  Filed Vitals:   06/08/14 0439  BP: 113/46  Pulse: 49  Temp: 36.8 C  Resp: 20    Complications: No apparent anesthesia complications

## 2014-06-08 NOTE — Anesthesia Procedure Notes (Signed)
Procedure Name: Intubation Date/Time: 06/08/2014 7:40 AM Performed by: Jenne Campus Pre-anesthesia Checklist: Patient identified, Emergency Drugs available, Suction available, Patient being monitored and Timeout performed Patient Re-evaluated:Patient Re-evaluated prior to inductionOxygen Delivery Method: Circle system utilized Preoxygenation: Pre-oxygenation with 100% oxygen Intubation Type: IV induction Ventilation: Mask ventilation without difficulty Laryngoscope Size: Miller and 2 Grade View: Grade II Tube type: Oral Tube size: 7.0 mm Number of attempts: 1 Airway Equipment and Method: Stylet Placement Confirmation: ETT inserted through vocal cords under direct vision,  positive ETCO2,  CO2 detector and breath sounds checked- equal and bilateral Secured at: 21 cm Tube secured with: Tape Dental Injury: Teeth and Oropharynx as per pre-operative assessment

## 2014-06-08 NOTE — Progress Notes (Signed)
  [facility] Progress Note  06/08/2014 6:55 PM Day of Surgery  Subjective:  Had some pain left foot earlier. Well controlled with pain medication.   Filed Vitals:   06/08/14 1642  BP: 119/55  Pulse: 60  Temp: 97.8 F (36.6 C)  Resp: 16    Physical Exam: Incisions:  Left leg dressings clean with minimal drainage to left groin dressing.  Extremities:  Biphasic left DP and PT doppler signals.   CBC    Component Value Date/Time   WBC 13.7* 06/01/2014 1902   RBC 4.14 06/01/2014 1902   HGB 12.0 06/01/2014 1902   HCT 35.9* 06/01/2014 1902   PLT 263 06/01/2014 1902   MCV 86.7 06/01/2014 1902   MCH 29.0 06/01/2014 1902   MCHC 33.4 06/01/2014 1902   RDW 13.9 06/01/2014 1902   LYMPHSABS 2.7 10/28/2013 1141   MONOABS 0.5 10/28/2013 1141   EOSABS 0.2 10/28/2013 1141   BASOSABS 0.0 10/28/2013 1141    BMET    Component Value Date/Time   NA 141 06/05/2014 0530   K 4.2 06/05/2014 0530   CL 103 06/05/2014 0530   CO2 31 06/05/2014 0530   GLUCOSE 112* 06/05/2014 0530   BUN 11 06/05/2014 0530   CREATININE 0.96 06/08/2014 0450   CALCIUM 9.3 06/05/2014 0530   GFRNONAA 64* 06/08/2014 0450   GFRAA 75* 06/08/2014 0450    INR    Component Value Date/Time   INR 1.03 06/01/2014 1902     Intake/Output Summary (Last 24 hours) at 06/08/14 1855 Last data filed at 06/08/14 1800  Gross per 24 hour  Intake   2550 ml  Output    630 ml  Net   1920 ml     Assessment:  59 y.o. female is s/p: Left external iliac and common femoral endarterectomy and Dacron patch angioplasty, left femoral to below-knee popliteal bypass with translocated non-reversed great saphenous vein Day of Surgery  Plan: -Good left DP and PT doppler signals. -Stable post-operatively -Will mobilize tomorrow.  -DVT prophylaxis: Lovenox   Virgina Jock, PA-C [facility] Office: [phone] Pager: [phone] 06/08/2014 6:55 PM

## 2014-06-08 NOTE — Anesthesia Preprocedure Evaluation (Addendum)
Anesthesia Evaluation  Patient identified by MRN, date of birth, ID band Patient awake    Reviewed: Allergy & Precautions, NPO status , Patient's Chart, lab work & pertinent test results, reviewed documented beta blocker date and time   History of Anesthesia Complications (+) PONV and history of anesthetic complications  Airway Mallampati: III  TM Distance: >3 FB Neck ROM: Full    Dental  (+) Dental Advisory Given, Teeth Intact   Pulmonary COPDformer smoker (quit '03),  breath sounds clear to auscultation        Cardiovascular hypertension, Pt. on medications + Peripheral Vascular Disease Rhythm:Regular Rate:Normal     Neuro/Psych negative neurological ROS     GI/Hepatic Neg liver ROS, GERD-  Medicated and Controlled,  Endo/Other  diabetes, Type 2, Oral Hypoglycemic Agents, Insulin DependentHypothyroidism   Renal/GU negative Renal ROS     Musculoskeletal   Abdominal   Peds  Hematology negative hematology ROS (+)   Anesthesia Other Findings   Reproductive/Obstetrics                          Anesthesia Physical Anesthesia Plan  ASA: III  Anesthesia Plan: General   Post-op Pain Management:    Induction: Intravenous  Airway Management Planned: Oral ETT  Additional Equipment:   Intra-op Plan:   Post-operative Plan: Extubation in OR  Informed Consent: I have reviewed the patients History and Physical, chart, labs and discussed the procedure including the risks, benefits and alternatives for the proposed anesthesia with the patient or authorized representative who has indicated his/her understanding and acceptance.   Dental advisory given  Plan Discussed with: CRNA and Surgeon  Anesthesia Plan Comments: (Plan routine monitors, GETA)        Anesthesia Quick Evaluation

## 2014-06-08 NOTE — H&P (View-Only) (Signed)
Subjective: Interval History: none.. Comfortable. Less discomfort in her foot.  Objective: Vital signs in last 24 hours: Temp:  [98 F (36.7 C)-98.5 F (36.9 C)] 98.5 F (36.9 C) (02/07 0444) Pulse Rate:  [50-57] 50 (02/07 0444) Resp:  [17-18] 17 (02/07 0444) BP: (107-128)/(40-62) 128/40 mmHg (02/07 0444) SpO2:  [95 %-97 %] 95 % (02/07 0444)  Intake/Output from previous day: 02/06 0701 - 02/07 0700 In: 1120 [P.O.:720; I.V.:400] Out: -  Intake/Output this shift:    Marked resolution and erythema on dorsum of her left foot. Continued dry gangrenous changes and duskiness of her second toe.  Lab Results: No results for input(s): WBC, HGB, HCT, PLT in the last 72 hours. BMET  Recent Labs  06/04/14 0915 06/05/14 0530  NA 141 141  K 4.3 4.2  CL 103 103  CO2 33* 31  GLUCOSE 102* 112*  BUN 13 11  CREATININE 0.79 0.80  CALCIUM 9.2 9.3    Studies/Results: No results found. Anti-infectives: Anti-infectives    Start     Dose/Rate Route Frequency Ordered Stop   06/01/14 2200  piperacillin-tazobactam (ZOSYN) IVPB 3.375 g  Status:  Discontinued     3.375 g12.5 mL/hr over 240 Minutes Intravenous 3 times per day 06/01/14 1814 06/04/14 1618   06/01/14 2000  vancomycin (VANCOCIN) IVPB 1000 mg/200 mL premix  Status:  Discontinued     1,000 mg200 mL/hr over 60 Minutes Intravenous Every 12 hours 06/01/14 1904 06/04/14 1640      Assessment/Plan: s/p Procedure(s): LOWER EXTREMITY ANGIOGRAM (N/A) ABDOMINAL ANGIOGRAM 4 left common femoral endarterectomy and left femoral-popliteal bypass tomorrow. Again discussed may lose her second toe which is very upsetting to the patient.   LOS: 6 days   EARLY, TODD 06/07/2014, 8:56 AM

## 2014-06-08 NOTE — Interval H&P Note (Signed)
History and Physical Interval Note:  06/08/2014 6:58 AM  Theresa Harrington  has presented today for surgery, with the diagnosis of endarterectomy femoral  The various methods of treatment have been discussed with the patient and family. After consideration of risks, benefits and other options for treatment, the patient has consented to  Procedure(s): ENDARTERECTOMY FEMORAL (Left) BYPASS GRAFT FEMORAL-POPLITEAL ARTERY (Left) as a surgical intervention .  The patient's history has been reviewed, patient examined, no change in status, stable for surgery.  I have reviewed the patient's chart and labs.  Questions were answered to the patient's satisfaction.     Tuere Nwosu

## 2014-06-08 NOTE — Op Note (Signed)
OPERATIVE REPORT  DATE OF SURGERY: 06/08/2014  PATIENT: Chistine Dematteo, 59 y.o. 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  PHYSICIAN ASSISTANT: Trinh  ANESTHESIA:  Gen.  EBL: 100 ml  Total I/O In: 2200 [I.V.:2200] Out: 630 [Urine:530; Blood:100]  BLOOD ADMINISTERED: None  DRAINS: None  SPECIMEN: None  COUNTS CORRECT:  YES  PLAN OF CARE: PACU   PATIENT DISPOSITION:  PACU - hemodynamically stable  PROCEDURE DETAILS: Patient was taken to the operative placed supine dysrhythmia the left groin left leg were prepped and draped in usual sterile fashion. Incision was made over the common femoral artery and carried down to isolate the common superficial femoral and profunda femoris arteries. The patient had extensive plaque in the common femoral artery with near subtotal occlusion as was seen on the preoperative arteriogram. The external iliac artery was exposed up under the inguinal ligament. The crossing vein was ligated and divided. Tributary branches were controlled with 2-0 silk Potts ties. Patient had several different profunda branches were controlled with vessel loops and the superficial femoral artery was occluded as controlled with a vessel loop as well. Saphenous vein was identified at the saphenofemoral junction. Tributary branches were ligated with 301 4-0 silk ties and divided. The vein was harvested from the level the groin to the below-knee position using several skin bridges. The vein was small to moderate size. The below-knee popliteal artery was exposed the same distal vein harvest incision on the medial approach. The artery did have some  calcification just above the takeoff of the anterior tibial as was seen on arteriogram. The anterior tibial and tibioperoneal trunk were isolated as well as the below-knee popliteal artery. A tunnel was created from the level of the below-knee popliteal to the groin with a 6 mm long Gore tunneler. The vein was excised after oversewing the saphenofemoral junction with a running 5-0 Prolene suture. The vein was harvested from mid calf to the groin. The vein was gently dilated moderate size throughout its course. Tributary branches were ligated and divided with reinforcement I's. The patient was given 7015 supper circulation time the external iliac was occluded proximally and the superficial femoral puncture was arteries were occluded with Vesseloops and vascular clamps. The common femoral artery was opened and there was a coral reef plaque that was subtotally occluding the common femoral artery. Endarterectomy was begun up under the inguinal ligament and external iliac artery and this was continued down onto the superficial femoral and profundus reverse arteries takeoffs. The endarterectomy was continued into the profunda down to several branches. There was a good feathering endpoint. Interrupted 6-0 Prolene sutures were used to tack the distal endarterectomy endpoint on the superficial femoral artery and the profunda. A thin S Hemashield Dacron patch was brought onto the field and sewn as a patch angioplasty with a running 6-0 Prolene suture. Prior to completion of the anastomosis the usual flushing maneuvers were undertaken. Anastomosis completed. Next a longitudinal incision was made in the central portion of the patch. The vein saphenous vein was brought onto the field and was spatulated. It was very thickened and non-usable at the proximal portion of this was excised. Loculated and sewn end-to-side to the Dacron patch with a running 6-0  Prolene suture. This anastomosis tested and found to be adequate. Next using  a Mills valvulotome, the vein valves were lysed throughout the vein graft. This gave good flow through the vein graft. There was moderate size. This was brought into approximation with the below-knee popliteal artery to the prior created, with the [facility] tunneler. The vein was reoccluded and was marked to prevent twisting. The pneumatic tourniquet was placed on the thigh. The foot was elevated and was exsanguinated with an Esmarch tourniquet. The the tourniquet was inflated 250 mmHg. The popliteal artery was opened. There was a eccentric plaque just before the takeoff of the anterior tibial artery and this was endarterectomized. The endpoint was tacked with 6-0 Prolene suture. To dilator passed into the anterior tibial and tibioperoneal trunk. The vein was cut to appropriate length and was spatulated and sewn end-to-side with the beginning portion on the below-knee popliteal and the hood of the graft extending onto the tibioperoneal trunk. Prior to completion of the closure to dilator again passed through the anterior tibial and tibioperoneal trunk. Deflated after the usual flushing maneuvers the anastomosis was completed. Excellent graft dependent Doppler flow is noted in the anterior tibial and tibioperoneal trunk. The patient heparin was reversed with protamine. The wound irrigated with saline. Hemostasis stasis tablet cautery. The groin fascia and popliteal were closed with 2-0 Vicryl in a running fashion. The skin was closed with 30 and 4 subcuticular Vicryl stitches. Sterile dressing was applied the patient was taken to the recovery room in stable condition   Curt Jews, M.D. 06/08/2014 3:04 PM

## 2014-06-08 NOTE — Progress Notes (Signed)
Utilization review completed.  

## 2014-06-09 ENCOUNTER — Inpatient Hospital Stay (HOSPITAL_COMMUNITY): Payer: 59 | Admitting: Anesthesiology

## 2014-06-09 ENCOUNTER — Encounter (HOSPITAL_COMMUNITY): Payer: Self-pay | Admitting: Vascular Surgery

## 2014-06-09 ENCOUNTER — Encounter (HOSPITAL_COMMUNITY)
Admission: AD | Disposition: A | Discharge status: Home Health | Payer: Self-pay | Source: Ambulatory Visit | Attending: Surgery

## 2014-06-09 DIAGNOSIS — Z9889 Other specified postprocedural states: Secondary | ICD-10-CM

## 2014-06-09 DIAGNOSIS — T82868A Thrombosis of vascular prosthetic devices, implants and grafts, initial encounter: Secondary | ICD-10-CM

## 2014-06-09 HISTORY — PX: FEMORAL-POPLITEAL BYPASS GRAFT: SHX937

## 2014-06-09 LAB — BASIC METABOLIC PANEL
ANION GAP: 7 (ref 5–15)
BUN: 16 mg/dL (ref 6–23)
CHLORIDE: 103 mmol/L (ref 96–112)
CO2: 27 mmol/L (ref 19–32)
CREATININE: 0.83 mg/dL (ref 0.50–1.10)
Calcium: 8.9 mg/dL (ref 8.4–10.5)
GFR calc Af Amer: 89 mL/min — ABNORMAL LOW (ref >90)
GFR calc non Af Amer: 77 mL/min — ABNORMAL LOW (ref >90)
Glucose, Bld: 166 mg/dL — ABNORMAL HIGH (ref 70–99)
Potassium: 4.3 mmol/L (ref 3.5–5.1)
Sodium: 137 mmol/L (ref 135–145)

## 2014-06-09 LAB — POCT I-STAT GLUCOSE
Glucose, Bld: 143 mg/dL — ABNORMAL HIGH (ref 70–99)
Operator id: 153281

## 2014-06-09 LAB — GLUCOSE, CAPILLARY
GLUCOSE-CAPILLARY: 128 mg/dL — AB (ref 70–99)
Glucose-Capillary: 174 mg/dL — ABNORMAL HIGH (ref 70–99)
Glucose-Capillary: 153 mg/dL — ABNORMAL HIGH (ref 70–99)
Glucose-Capillary: 126 mg/dL — ABNORMAL HIGH (ref 70–99)
Glucose-Capillary: 145 mg/dL — ABNORMAL HIGH (ref 70–99)

## 2014-06-09 LAB — CBC
HEMATOCRIT: 32.9 % — AB (ref 36.0–46.0)
Hemoglobin: 10.6 g/dL — ABNORMAL LOW (ref 12.0–15.0)
MCH: 29.3 pg (ref 26.0–34.0)
MCHC: 32.2 g/dL (ref 30.0–36.0)
MCV: 90.9 fL (ref 78.0–100.0)
Platelets: 282 10*3/uL (ref 150–400)
RBC: 3.62 MIL/uL — AB (ref 3.87–5.11)
RDW: 13.9 % (ref 11.5–15.5)
WBC: 13 10*3/uL — ABNORMAL HIGH (ref 4.0–10.5)

## 2014-06-09 SURGERY — BYPASS GRAFT FEMORAL-POPLITEAL ARTERY
Anesthesia: General | Laterality: Left

## 2014-06-09 MED ORDER — 0.9 % SODIUM CHLORIDE (POUR BTL) OPTIME
TOPICAL | Status: DC | PRN
  Administered 2014-06-09: 3000 mL

## 2014-06-09 MED ORDER — SODIUM CHLORIDE 0.9 % IR SOLN
Status: DC | PRN
  Administered 2014-06-09: 500 mL

## 2014-06-09 MED ORDER — ARTIFICIAL TEARS OP OINT
TOPICAL_OINTMENT | OPHTHALMIC | Status: AC
  Filled 2014-06-09: qty 3.5

## 2014-06-09 MED ORDER — FENTANYL CITRATE 0.05 MG/ML IJ SOLN
INTRAMUSCULAR | Status: AC
  Filled 2014-06-09: qty 2

## 2014-06-09 MED ORDER — GLYCOPYRROLATE 0.2 MG/ML IJ SOLN
INTRAMUSCULAR | Status: DC | PRN
  Administered 2014-06-09: .7 mg via INTRAVENOUS

## 2014-06-09 MED ORDER — HEPARIN SODIUM (PORCINE) 1000 UNIT/ML IJ SOLN
INTRAMUSCULAR | Status: DC | PRN
Start: 2014-06-09 — End: 2014-06-09
  Administered 2014-06-09 (×2): 1000 [IU] via INTRAVENOUS
  Administered 2014-06-09: 7500 [IU] via INTRAVENOUS

## 2014-06-09 MED ORDER — NEOSTIGMINE METHYLSULFATE 10 MG/10ML IV SOLN
INTRAVENOUS | Status: AC
  Filled 2014-06-09: qty 1

## 2014-06-09 MED ORDER — HYDROMORPHONE HCL 1 MG/ML IJ SOLN
INTRAMUSCULAR | Status: AC
  Filled 2014-06-09: qty 1

## 2014-06-09 MED ORDER — PHENYLEPHRINE HCL 10 MG/ML IJ SOLN
10.0000 mg | INTRAVENOUS | Status: DC | PRN
  Administered 2014-06-09: 50 ug/min via INTRAVENOUS

## 2014-06-09 MED ORDER — HYDROMORPHONE HCL 1 MG/ML IJ SOLN: 0.2500 mg | INTRAMUSCULAR | Status: DC | PRN

## 2014-06-09 MED ORDER — VANCOMYCIN HCL 1000 MG IV SOLR
1000.0000 mg | INTRAVENOUS | Status: DC | PRN
  Administered 2014-06-09: 1000 mg via INTRAVENOUS

## 2014-06-09 MED ORDER — PROTAMINE SULFATE 10 MG/ML IV SOLN
INTRAVENOUS | Status: AC
  Filled 2014-06-09: qty 5

## 2014-06-09 MED ORDER — ARTIFICIAL TEARS OP OINT
TOPICAL_OINTMENT | OPHTHALMIC | Status: DC | PRN
  Administered 2014-06-09: 1 via OPHTHALMIC

## 2014-06-09 MED ORDER — LIDOCAINE HCL (CARDIAC) 20 MG/ML IV SOLN
INTRAVENOUS | Status: DC | PRN
  Administered 2014-06-09: 50 mg via INTRAVENOUS

## 2014-06-09 MED ORDER — FENTANYL CITRATE 0.05 MG/ML IJ SOLN
INTRAMUSCULAR | Status: DC | PRN
  Administered 2014-06-09: 100 ug via INTRAVENOUS
  Administered 2014-06-09 (×2): 50 ug via INTRAVENOUS

## 2014-06-09 MED ORDER — GLYCOPYRROLATE 0.2 MG/ML IJ SOLN
INTRAMUSCULAR | Status: AC
  Filled 2014-06-09: qty 2

## 2014-06-09 MED ORDER — LACTATED RINGERS IV SOLN
INTRAVENOUS | Status: DC
  Administered 2014-06-09: 18:00:00 via INTRAVENOUS

## 2014-06-09 MED ORDER — ONDANSETRON HCL 4 MG/2ML IJ SOLN
INTRAMUSCULAR | Status: AC
  Filled 2014-06-09: qty 2

## 2014-06-09 MED ORDER — FENTANYL CITRATE 0.05 MG/ML IJ SOLN
50.0000 ug | Freq: Once | INTRAMUSCULAR | Status: AC
  Administered 2014-06-09: 50 ug via INTRAVENOUS

## 2014-06-09 MED ORDER — FENTANYL CITRATE 0.05 MG/ML IJ SOLN
INTRAMUSCULAR | Status: AC
  Filled 2014-06-09: qty 5

## 2014-06-09 MED ORDER — PHENYLEPHRINE HCL 10 MG/ML IJ SOLN
INTRAMUSCULAR | Status: DC | PRN
  Administered 2014-06-09: 80 ug via INTRAVENOUS
  Administered 2014-06-09: 40 ug via INTRAVENOUS
  Administered 2014-06-09 (×2): 80 ug via INTRAVENOUS

## 2014-06-09 MED ORDER — SUCCINYLCHOLINE CHLORIDE 20 MG/ML IJ SOLN
INTRAMUSCULAR | Status: DC | PRN
  Administered 2014-06-09: 100 mg via INTRAVENOUS

## 2014-06-09 MED ORDER — ROCURONIUM BROMIDE 100 MG/10ML IV SOLN
INTRAVENOUS | Status: DC | PRN
  Administered 2014-06-09: 10 mg via INTRAVENOUS
  Administered 2014-06-09: 30 mg via INTRAVENOUS
  Administered 2014-06-09 (×3): 10 mg via INTRAVENOUS

## 2014-06-09 MED ORDER — ONDANSETRON HCL 4 MG/2ML IJ SOLN
INTRAMUSCULAR | Status: DC | PRN
  Administered 2014-06-09: 4 mg via INTRAVENOUS

## 2014-06-09 MED ORDER — PROTAMINE SULFATE 10 MG/ML IV SOLN
INTRAVENOUS | Status: DC | PRN
  Administered 2014-06-09: 50 mg via INTRAVENOUS

## 2014-06-09 MED ORDER — PROMETHAZINE HCL 25 MG/ML IJ SOLN: 6.2500 mg | INTRAMUSCULAR | Status: DC | PRN

## 2014-06-09 MED ORDER — DEXAMETHASONE SODIUM PHOSPHATE 4 MG/ML IJ SOLN
INTRAMUSCULAR | Status: DC | PRN
  Administered 2014-06-09: 4 mg via INTRAVENOUS

## 2014-06-09 MED ORDER — HEMOSTATIC AGENTS (NO CHARGE) OPTIME
TOPICAL | Status: DC | PRN
  Administered 2014-06-09: 1 via TOPICAL

## 2014-06-09 MED ORDER — HEPARIN SODIUM (PORCINE) 1000 UNIT/ML IJ SOLN
INTRAMUSCULAR | Status: AC
  Filled 2014-06-09: qty 1

## 2014-06-09 MED ORDER — PROPOFOL 10 MG/ML IV BOLUS
INTRAVENOUS | Status: DC | PRN
  Administered 2014-06-09: 100 mg via INTRAVENOUS

## 2014-06-09 MED ORDER — LACTATED RINGERS IV SOLN
INTRAVENOUS | Status: DC | PRN
  Administered 2014-06-09 (×2): via INTRAVENOUS

## 2014-06-09 MED ORDER — DEXAMETHASONE SODIUM PHOSPHATE 4 MG/ML IJ SOLN
INTRAMUSCULAR | Status: AC
  Filled 2014-06-09: qty 1

## 2014-06-09 MED ORDER — THROMBIN 20000 UNITS EX SOLR
CUTANEOUS | Status: AC
  Filled 2014-06-09: qty 20000

## 2014-06-09 MED ORDER — VANCOMYCIN HCL IN DEXTROSE 1-5 GM/200ML-% IV SOLN
INTRAVENOUS | Status: AC
  Filled 2014-06-09: qty 200

## 2014-06-09 MED ORDER — MIDAZOLAM HCL 2 MG/2ML IJ SOLN
INTRAMUSCULAR | Status: AC
  Filled 2014-06-09: qty 2

## 2014-06-09 MED ORDER — NEOSTIGMINE METHYLSULFATE 10 MG/10ML IV SOLN
INTRAVENOUS | Status: DC | PRN
  Administered 2014-06-09: 4 mg via INTRAVENOUS

## 2014-06-09 MED ORDER — MIDAZOLAM HCL 5 MG/5ML IJ SOLN
INTRAMUSCULAR | Status: DC | PRN
  Administered 2014-06-09 (×2): 1 mg via INTRAVENOUS

## 2014-06-09 SURGICAL SUPPLY — 66 items
ADH SKN CLS APL DERMABOND .7 (GAUZE/BANDAGES/DRESSINGS) ×2
BANDAGE ELASTIC 4 VELCRO ST LF (GAUZE/BANDAGES/DRESSINGS) IMPLANT
BANDAGE ESMARK 6X9 LF (GAUZE/BANDAGES/DRESSINGS) IMPLANT
BNDG CMPR 9X6 STRL LF SNTH (GAUZE/BANDAGES/DRESSINGS) ×1
BNDG ESMARK 6X9 LF (GAUZE/BANDAGES/DRESSINGS) ×3
CANISTER SUCTION 2500CC (MISCELLANEOUS) ×3 IMPLANT
CATH EMB 3FR 80CM (CATHETERS) ×2 IMPLANT
CLIP TI MEDIUM 24 (CLIP) ×3 IMPLANT
CLIP TI WIDE RED SMALL 24 (CLIP) ×3 IMPLANT
CUFF TOURNIQUET SINGLE 24IN (TOURNIQUET CUFF) IMPLANT
CUFF TOURNIQUET SINGLE 34IN LL (TOURNIQUET CUFF) ×2 IMPLANT
CUFF TOURNIQUET SINGLE 44IN (TOURNIQUET CUFF) IMPLANT
DERMABOND ADVANCED (GAUZE/BANDAGES/DRESSINGS) ×4
DERMABOND ADVANCED .7 DNX12 (GAUZE/BANDAGES/DRESSINGS) IMPLANT
DRAIN CHANNEL 15F RND FF W/TCR (WOUND CARE) IMPLANT
DRAPE X-RAY CASS 24X20 (DRAPES) IMPLANT
DRSG COVADERM 4X10 (GAUZE/BANDAGES/DRESSINGS) IMPLANT
DRSG COVADERM 4X8 (GAUZE/BANDAGES/DRESSINGS) IMPLANT
ELECT REM PT RETURN 9FT ADLT (ELECTROSURGICAL) ×3
ELECTRODE REM PT RTRN 9FT ADLT (ELECTROSURGICAL) ×1 IMPLANT
EVACUATOR SILICONE 100CC (DRAIN) IMPLANT
GLOVE BIO SURGEON STRL SZ 6.5 (GLOVE) ×2 IMPLANT
GLOVE BIO SURGEONS STRL SZ 6.5 (GLOVE) ×2
GLOVE BIOGEL PI IND STRL 6.5 (GLOVE) IMPLANT
GLOVE BIOGEL PI IND STRL 7.5 (GLOVE) ×1 IMPLANT
GLOVE BIOGEL PI INDICATOR 6.5 (GLOVE) ×4
GLOVE BIOGEL PI INDICATOR 7.5 (GLOVE) ×4
GLOVE SURG SS PI 7.5 STRL IVOR (GLOVE) ×5 IMPLANT
GOWN STRL REUS W/ TWL LRG LVL3 (GOWN DISPOSABLE) ×2 IMPLANT
GOWN STRL REUS W/ TWL XL LVL3 (GOWN DISPOSABLE) ×1 IMPLANT
GOWN STRL REUS W/TWL LRG LVL3 (GOWN DISPOSABLE) ×9
GOWN STRL REUS W/TWL XL LVL3 (GOWN DISPOSABLE) ×3
GRAFT PROPATEN W/RING 6X80X60 (Vascular Products) ×2 IMPLANT
HEMOSTAT SNOW SURGICEL 2X4 (HEMOSTASIS) IMPLANT
KIT BASIN OR (CUSTOM PROCEDURE TRAY) ×3 IMPLANT
KIT ROOM TURNOVER OR (KITS) ×3 IMPLANT
LIQUID BAND (GAUZE/BANDAGES/DRESSINGS) ×3 IMPLANT
MARKER GRAFT CORONARY BYPASS (MISCELLANEOUS) IMPLANT
NS IRRIG 1000ML POUR BTL (IV SOLUTION) ×6 IMPLANT
PACK PERIPHERAL VASCULAR (CUSTOM PROCEDURE TRAY) ×3 IMPLANT
PAD ARMBOARD 7.5X6 YLW CONV (MISCELLANEOUS) ×6 IMPLANT
PADDING CAST COTTON 6X4 STRL (CAST SUPPLIES) ×2 IMPLANT
PROBE PENCIL 8 MHZ STRL DISP (MISCELLANEOUS) ×2 IMPLANT
SET COLLECT BLD 21X3/4 12 (NEEDLE) IMPLANT
STOPCOCK 4 WAY LG BORE MALE ST (IV SETS) IMPLANT
SUT ETHILON 3 0 PS 1 (SUTURE) IMPLANT
SUT GORETEX 6.0 TT13 (SUTURE) ×2 IMPLANT
SUT GORETEX 6.0 TT9 (SUTURE) ×4 IMPLANT
SUT GORETEX CV-6TTC-13 36IN (SUTURE) ×2 IMPLANT
SUT PROLENE 5 0 C 1 24 (SUTURE) ×1 IMPLANT
SUT PROLENE 6 0 BV (SUTURE) ×5 IMPLANT
SUT PROLENE 7 0 BV 1 (SUTURE) ×2 IMPLANT
SUT PROLENE 7 0 BV1 MDA (SUTURE) ×2 IMPLANT
SUT SILK 2 0 SH (SUTURE) ×3 IMPLANT
SUT SILK 3 0 (SUTURE)
SUT SILK 3-0 18XBRD TIE 12 (SUTURE) IMPLANT
SUT VIC AB 2-0 CT1 27 (SUTURE) ×6
SUT VIC AB 2-0 CT1 TAPERPNT 27 (SUTURE) ×2 IMPLANT
SUT VIC AB 3-0 SH 27 (SUTURE) ×6
SUT VIC AB 3-0 SH 27X BRD (SUTURE) ×2 IMPLANT
SUT VICRYL 4-0 PS2 18IN ABS (SUTURE) ×6 IMPLANT
SYR 3ML LL SCALE MARK (SYRINGE) ×2 IMPLANT
TRAY FOLEY CATH 16FRSI W/METER (SET/KITS/TRAYS/PACK) ×3 IMPLANT
TUBING EXTENTION W/L.L. (IV SETS) IMPLANT
UNDERPAD 30X30 INCONTINENT (UNDERPADS AND DIAPERS) ×3 IMPLANT
WATER STERILE IRR 1000ML POUR (IV SOLUTION) ×3 IMPLANT

## 2014-06-09 NOTE — Op Note (Signed)
Patient name: Theresa Harrington MRN: [id number] DOB: 1956-02-08 Sex: female  06/01/2014 - 06/09/2014 Pre-operative Diagnosis: Occluded left leg bypass graft Post-operative diagnosis:  Same Surgeon:  Eldridge Abrahams Assistants:  Leontine Locket Procedure:   #1: Left femoral to below knee popliteal artery bypass graft with 6 mm propatent   #2: Endarterectomy, left anterior tibial artery   #3: Redo left common femoral and popliteal artery exposure   #4: Thrombectomy left popliteal artery Anesthesia:  Gen. Blood Loss:  See anesthesia record Specimens:  None  Findings:  The vein bypass graft was occluded.  There was platelet aggregates and thrombus within the common femoral artery.  A large plaque was noted at the anterior tibial artery.  The vein graft was removed and a 6 mm PTFE graft was placed  Indications:  The patient is status post left common femoral endarterectomy with patch angioplasty and left femoral to below-knee popliteal artery bypass graft with vein yesterday by Dr. early.  ABIs were decreased today and duplex could not identify a bypass graft.  Therefore told the patient that she needed to go back to the operating room for repair/revision  Procedure:  The patient was identified in the holding area and taken to [facility]  The patient was then placed supine on the table. general anesthesia was administered.  The patient was prepped and draped in the usual sterile fashion.  A time out was called and antibiotics were administered.  The patient's femoral and a below knee incisions were reopened.  I exposed the common femoral, profunda femoral, and superficial femoral artery and their branches and encircled them with vessel loops.  The below knee popliteal artery was then exposed as well as the proximal portion of the anterior tibial artery.  I divided the anterior tibial vein to get better exposure to the tibioperoneal trunk.  It was obvious that the vein graft had occluded.  Therefore,  before heparinizing the patient I re-tunneled using a 6 mm tunneler.  The patient wasn't fully heparinized.  After the heparin circulated the common femoral, profunda femoral and superficial femoral artery were occluded.  A #11 blade was used to take down the proximal anastomosis from the dacryon patch.  The vein graft had thrombus throughout.  There was also a platelet aggregate on the posterior wall of the common femoral artery which I removed.  I felt that the vein graft was not re-usable and therefore elected to get a 6 mm external ring propatent PTFE.  I inspected the origin of the profunda femoral artery and it was widely patent with no evidence of any intimal flap.  I then spatulated the Gore-Tex graft and performed a end to side anastomosis with CV 6 suture.  Once this was completed, the appropriate flushing maneuvers were performed.  Clamps were released and there was excellent pulsatile flow through the graft.  The graft was then reoccluded and brought through the previously created tunnel, maintaining proper orientation.  I then placed a [place] followed by tourniquet on the left upper thigh.  The leg was exsanguinated with an Esmarch and the tourniquet was inflated to 250 mm of pressure.  I then took down the distal anastomosis.  There was clot within the popliteal artery which I removed and then I passed a #3 Fogarty catheter down the anterior tibial and tibioperoneal trunk.  Nno thrombus was evacuated.  There was a large plaque at the origin of the anterior tibial artery.  I used a Marlene Bast  elevator to remove the bulky portion of the plaque.  I then placed 7-0 Prolene tacking sutures to tack the plaque.The Gore-Tex graft was then cut to the appropriate length and spatulated.  It was sewn in a end-to-side fashion with running CV 6 suture.  Prior to completion, the appropriate flushing maneuvers were performed and the tourniquet was let down.  The anastomosis was completed.  There was an excellent  Doppler signal which was graft dependent in the anterior tibial and posterior tibial artery.  I elected not to shoot an arteriogram because of how good the signal sounded.  I then removed the thrombosed vein graft.  On inspection it appeared that there were areas that had not remained fully dilated.  I reversed the heparin with 50 mg protamine.  Once hemostasis was satisfactory, the incisions were closed with 2-0,3-0 and 4-0 Vicryl.  Dermabond was applied.  No immediate complications.   Disposition:  To PACU in stable condition.   Theotis Burrow, M.D. [facility] [place] Office: [phone] Pager:  [phone]

## 2014-06-09 NOTE — Progress Notes (Addendum)
  Progress Note    06/09/2014 7:26 AM 1 Day Post-Op  Subjective:  C/o less soreness on dorsum of foot where there is redness.  C/o soreness.  Wants to know when the swelling is going to go down.  afebrile 40'H-47-'Q systolic HR  25'Z SB 56% RA  Filed Vitals:   06/09/14 0453  BP:   Pulse: 55  Temp:   Resp: 11    Physical Exam: Lungs:  Non labored Incisions:  All c/d/i with steri strips in place.  Left groin covered (will remove this tomorrow) Extremities:  Dry gangrenous 2nd left toe; erythema on dorsum of foot; less painful to touch; trace edema left leg.   CBC    Component Value Date/Time   WBC 13.0* 06/09/2014 0027   RBC 3.62* 06/09/2014 0027   HGB 10.6* 06/09/2014 0027   HCT 32.9* 06/09/2014 0027   PLT 282 06/09/2014 0027   MCV 90.9 06/09/2014 0027   MCH 29.3 06/09/2014 0027   MCHC 32.2 06/09/2014 0027   RDW 13.9 06/09/2014 0027   LYMPHSABS 2.7 10/28/2013 1141   MONOABS 0.5 10/28/2013 1141   EOSABS 0.2 10/28/2013 1141   BASOSABS 0.0 10/28/2013 1141    BMET    Component Value Date/Time   NA 137 06/09/2014 0027   K 4.3 06/09/2014 0027   CL 103 06/09/2014 0027   CO2 27 06/09/2014 0027   GLUCOSE 166* 06/09/2014 0027   BUN 16 06/09/2014 0027   CREATININE 0.83 06/09/2014 0027   CALCIUM 8.9 06/09/2014 0027   GFRNONAA 77* 06/09/2014 0027   GFRAA 89* 06/09/2014 0027    INR    Component Value Date/Time   INR 1.03 06/01/2014 1902     Intake/Output Summary (Last 24 hours) at 06/09/14 0726 Last data filed at 06/09/14 0454  Gross per 24 hour  Intake   3485 ml  Output   1030 ml  Net   2455 ml     Assessment:  59 y.o. female is s/p:  1 left external iliac and common femoral endarterectomy and Dacron patch angioplasty, #2 left femoral to below-knee popliteal bypass with translocated non-reversed great saphenous vein  1 Day Post-Op  Plan: -pt doing well this am -pt needs to mobilize -transfer to 2 west -explained the importance of keeping the groin  wound clean and dry-she expressed understanding. -WBC improved today -minimal acute surgical blood loss anemia-tolerating -DVT prophylaxis:  Lovenox to start this afternoon   Leontine Locket, PA-C [facility] [phone] 06/09/2014 7:26 AM    I have examined the patient, reviewed and agree with above.  Yuval Rubens, MD 06/09/2014 8:56 AM

## 2014-06-09 NOTE — Progress Notes (Signed)
VASCULAR LAB PRELIMINARY  ARTERIAL  ABI completed:    RIGHT    LEFT    PRESSURE WAVEFORM  PRESSURE WAVEFORM  BRACHIAL 111 Triphasic BRACHIAL 112 Triphasic  DP 62 Dampened Monophasic DP 52 Dampened Monophasic  PT 88 Dampened Mophasic PT 59 Dampened Monophasic    RIGHT LEFT  ABI 0.79 0.53   Right ABIs indicate a mild reduction in arterial flow. Left ABIs indicate a moderate reduction in arteial flow. Doppler wavweforms are abnormal bilateally.  Duplex scan of the left lower extremity - Unable to visualize the femoral to popliteal graft. Possible occlusion. Dr. [name], M.D. At the beside. Patient to be taken back to surgery tonight.  Culley Hedeen, RVS 06/09/2014, 5:43 PM

## 2014-06-09 NOTE — Progress Notes (Signed)
Pt received from icu. U/s tech and Dr. Trula Slade in room performing u/s. Orders given for pt to go to surgery. CHG performed consent signed.

## 2014-06-09 NOTE — Progress Notes (Signed)
Patients to transfer to 2W21 report given to receiving nurse Adrianne all questions answered at this time.  Pt. VSS with no s/s of distress noted.  Patient stable at transfer.

## 2014-06-09 NOTE — Anesthesia Preprocedure Evaluation (Addendum)
Anesthesia Evaluation  Patient identified by MRN, date of birth, ID band Patient awake    Reviewed: Allergy & Precautions, NPO status , Patient's Chart, lab work & pertinent test results  History of Anesthesia Complications (+) PONV  Airway Mallampati: II  TM Distance: >3 FB Neck ROM: Full    Dental no notable dental hx.    Pulmonary neg pulmonary ROS, former smoker,  breath sounds clear to auscultation  Pulmonary exam normal       Cardiovascular hypertension, Pt. on medications + Peripheral Vascular Disease Rhythm:Regular Rate:Normal     Neuro/Psych negative neurological ROS  negative psych ROS   GI/Hepatic negative GI ROS, Neg liver ROS,   Endo/Other  diabetesHypothyroidism   Renal/GU negative Renal ROS  negative genitourinary   Musculoskeletal negative musculoskeletal ROS (+)   Abdominal   Peds negative pediatric ROS (+)  Hematology  (+) anemia ,   Anesthesia Other Findings   Reproductive/Obstetrics negative OB ROS                            Anesthesia Physical Anesthesia Plan  ASA: III  Anesthesia Plan: General   Post-op Pain Management:    Induction: Intravenous  Airway Management Planned: Oral ETT  Additional Equipment:   Intra-op Plan:   Post-operative Plan: Extubation in OR  Informed Consent: I have reviewed the patients History and Physical, chart, labs and discussed the procedure including the risks, benefits and alternatives for the proposed anesthesia with the patient or authorized representative who has indicated his/her understanding and acceptance.   Dental advisory given  Plan Discussed with: CRNA and Surgeon  Anesthesia Plan Comments:         Anesthesia Quick Evaluation

## 2014-06-09 NOTE — Anesthesia Procedure Notes (Signed)
Procedure Name: Intubation Date/Time: 06/09/2014 6:49 PM Performed by: Hollie Salk Z Pre-anesthesia Checklist: Patient identified, Patient being monitored, Emergency Drugs available, Timeout performed and Suction available Patient Re-evaluated:Patient Re-evaluated prior to inductionOxygen Delivery Method: Circle system utilized Preoxygenation: Pre-oxygenation with 100% oxygen Intubation Type: IV induction, Rapid sequence and Cricoid Pressure applied Laryngoscope Size: Mac and 3 Grade View: Grade III Tube type: Oral Tube size: 7.5 mm Number of attempts: 1 Airway Equipment and Method: Stylet Placement Confirmation: ETT inserted through vocal cords under direct vision,  breath sounds checked- equal and bilateral and positive ETCO2 Secured at: 22 cm Tube secured with: Tape Dental Injury: Teeth and Oropharynx as per pre-operative assessment

## 2014-06-09 NOTE — Progress Notes (Signed)
VASCULAR LAB PRELIMINARY  ARTERIAL  ABI completed:    RIGHT    LEFT    PRESSURE WAVEFORM  PRESSURE WAVEFORM  BRACHIAL 111 Triphasic BRACHIAL 112 Triphasic  DP 62 Dampened Monophasic DP 52 Dampened Monophasic  PT 88 Dampened Mophasic PT 59 Dampened Monophasic    RIGHT LEFT  ABI 0.79 0.53   Right ABIs indicate a mild reduction in arterial flow. Left ABIs indicate a moderate reduction in arteial flow. Doppler wavweforms are abnormal bilateally.  Jhaden Pizzuto, RVS 06/09/2014, 3:27 PM

## 2014-06-09 NOTE — Transfer of Care (Signed)
Immediate Anesthesia Transfer of Care Note  Patient: Theresa Harrington  Procedure(s) Performed: Procedure(s): Left Femoral and Popliteal Exposure; Left Femoral to Anterior Tibial Bypass Graft using Propaten 7mm by 80cm Goretex Graft; Left Tibial Endarterectomy; Left Femoraland Popliteal Thrombectomy  (Left)  Patient Location: PACU  Anesthesia Type:General  Level of Consciousness: awake, alert , oriented and patient cooperative  Airway & Oxygen Therapy: Patient Spontanous Breathing and Patient connected to nasal cannula oxygen  Post-op Assessment: Report given to RN and Post -op Vital signs reviewed and stable  Post vital signs: Reviewed and stable  Last Vitals:  Filed Vitals:   06/09/14 1703  BP: 113/54  Pulse: 64  Temp: 36.7 C  Resp: 18    Complications: No apparent anesthesia complications

## 2014-06-09 NOTE — Evaluation (Signed)
Physical Therapy Evaluation Patient Details Name: Theresa Harrington MRN: [id number]  DOB: 06/05/1955 Today's Date: 06/09/2014   History of Present Illness  pt presents with L Fem pop.    Clinical Impression  Pt needs encouragement for ambulation, but minimal A needed for mobility.  Encouraged pt to ambulate again today with nsg staff.  Will continue to follow.      Follow Up Recommendations Home health PT;Supervision - Intermittent    Equipment Recommendations  Rolling walker with 5" wheels    Recommendations for Other Services       Precautions / Restrictions Precautions Precautions: None Restrictions Weight Bearing Restrictions: No      Mobility  Bed Mobility               General bed mobility comments: pt in recliner on arrival.    Transfers Overall transfer level: Needs assistance Equipment used: Rolling walker (2 wheeled) Transfers: Sit to/from Stand Sit to Stand: Min guard         General transfer comment: pt demos good use of UEs and cues for positioning of L LE.    Ambulation/Gait Ambulation/Gait assistance: Min guard Ambulation Distance (Feet): 80 Feet Assistive device: Rolling walker (2 wheeled) Gait Pattern/deviations: Step-to pattern;Decreased step length - right;Decreased stance time - left;Trunk flexed     General Gait Details: cues for use of RW, upright psoture and encouragement for ambulation.    Stairs            Wheelchair Mobility    Modified Rankin (Stroke Patients Only)       Balance Overall balance assessment: Needs assistance Sitting-balance support: No upper extremity supported;Feet supported Sitting balance-Leahy Scale: Good     Standing balance support: Single extremity supported;Bilateral upper extremity supported;During functional activity Standing balance-Leahy Scale: Poor Standing balance comment: pt needs at least one UE to maintain balance in standing.                               Pertinent  Vitals/Pain Pain Assessment: 0-10 Pain Score: 6  Pain Location: L LE incisions and foot Pain Descriptors / Indicators: Aching;Burning Pain Intervention(s): Monitored during session;Premedicated before session;Repositioned    Home Living Family/patient expects to be discharged to:: Private residence Living Arrangements: Alone Available Help at Discharge: Family;Available PRN/intermittently Type of Home: House Home Access: Stairs to enter Entrance Stairs-Rails: Right Entrance Stairs-Number of Steps: 4 Home Layout: One level Home Equipment: None      Prior Function Level of Independence: Independent               Hand Dominance        Extremity/Trunk Assessment   Upper Extremity Assessment: Defer to OT evaluation           Lower Extremity Assessment: LLE deficits/detail   LLE Deficits / Details: ROM and strength limited by post-op pain and edema.    Cervical / Trunk Assessment: Normal  Communication   Communication: No difficulties  Cognition Arousal/Alertness: Awake/alert Behavior During Therapy: WFL for tasks assessed/performed Overall Cognitive Status: Within Functional Limits for tasks assessed                      General Comments      Exercises        Assessment/Plan    PT Assessment Patient needs continued PT services  PT Diagnosis Difficulty walking   PT Problem List Decreased strength;Decreased range of motion;Decreased activity tolerance;Decreased balance;Decreased  mobility;Decreased coordination;Decreased knowledge of use of DME;Pain  PT Treatment Interventions DME instruction;Gait training;Stair training;Functional mobility training;Therapeutic activities;Therapeutic exercise;Balance training;Patient/family education   PT Goals (Current goals can be found in the Care Plan section) Acute Rehab PT Goals Patient Stated Goal: Home PT Goal Formulation: With patient Time For Goal Achievement: 06/16/14 Potential to Achieve Goals:  Good    Frequency Min 3X/week   Barriers to discharge        Co-evaluation               End of Session Equipment Utilized During Treatment: Gait belt Activity Tolerance: Patient tolerated treatment well Patient left: in chair;with call bell/phone within reach Nurse Communication: Mobility status         Time: 2122-4825 PT Time Calculation (min) (ACUTE ONLY): 15 min   Charges:   PT Evaluation $Initial PT Evaluation Tier I: 1 Procedure     PT G CodesCatarina Harrington, Hatch 06/09/2014, 11:37 AM

## 2014-06-09 NOTE — Evaluation (Signed)
Occupational Therapy Evaluation Patient Details Name: Theresa Harrington MRN: [id number] DOB: August 05, 1955 Today's Date: 06/09/2014    History of Present Illness pt presents with L Fem pop.     Clinical Impression   Pt s/p above. Pt independent with ADLs, PTA. Feel pt will benefit from acute OT to increase independence with BADLs, prior to d/c.     Follow Up Recommendations  No OT follow up;Supervision - Intermittent    Equipment Recommendations  None recommended by OT    Recommendations for Other Services       Precautions / Restrictions Precautions Precautions: Fall Restrictions Weight Bearing Restrictions: No      Mobility Bed Mobility               General bed mobility comments: not assessed  Transfers Overall transfer level: Needs assistance Transfers: Sit to/from Stand Sit to Stand: Min guard         General transfer comment: cues for technique. Pt stood from chair without walker and OT retrieved.          ADL Overall ADL's : Needs assistance/impaired                     Lower Body Dressing: Moderate assistance;Sit to/from stand   Toilet Transfer: Min guard;Ambulation;RW (chair)           Functional mobility during ADLs: Min guard;Rolling walker General ADL Comments: Educated on AE as well as LB dressing technique. Educated on safety such as safe shoewear and sitting for LB ADLs. Educated on shower transfer technique.      Vision                     Perception     Praxis      Pertinent Vitals/Pain Pain Assessment: 0-10 Pain Score: 10-Worst pain ever Pain Location: Lt ankle and buttocks Pain Descriptors / Indicators: Burning;Shooting Pain Intervention(s): Repositioned;Monitored during session;Limited activity within patient's tolerance     Hand Dominance     Extremity/Trunk Assessment Upper Extremity Assessment Upper Extremity Assessment: Overall WFL for tasks assessed   Lower Extremity Assessment Lower Extremity  Assessment: Defer to PT evaluation LLE Deficits / Details: ROM and strength limited by post-op pain and edema.   LLE Sensation: decreased light touch LLE Coordination: decreased fine motor   Cervical / Trunk Assessment Cervical / Trunk Assessment: Normal   Communication Communication Communication: No difficulties   Cognition Arousal/Alertness: Awake/alert Behavior During Therapy: WFL for tasks assessed/performed Overall Cognitive Status: Within Functional Limits for tasks assessed                     General Comments       Exercises       Shoulder Instructions      Home Living Family/patient expects to be discharged to:: Private residence Living Arrangements: Alone Available Help at Discharge: Family;Available PRN/intermittently Type of Home: House Home Access: Stairs to enter [employer] of Steps: 4 Entrance Stairs-Rails: Right Home Layout: One level     Bathroom Shower/Tub: Occupational psychologist: Handicapped height     Home Equipment: Research scientist (life sciences);Shower seat;Shower seat - built in [employer] Equipment: Reacher;Sock aid;Long-handled shoe horn;Long-handled sponge        Prior Functioning/Environment Level of Independence: Independent             OT Diagnosis: Acute pain   OT Problem List: Decreased strength;Decreased activity tolerance;Decreased range of motion;Decreased knowledge of precautions;Decreased knowledge of use of  DME or AE;Pain   OT Treatment/Interventions: Self-care/ADL training;DME and/or AE instruction;Therapeutic activities;Patient/family education;Balance training    OT Goals(Current goals can be found in the care plan section) Acute Rehab OT Goals Patient Stated Goal: not stated OT Goal Formulation: With patient Time For Goal Achievement: 06/16/14 Potential to Achieve Goals: Good ADL Goals Pt Will Perform Lower Body Dressing: with set-up;with adaptive equipment;sit to/from stand Pt Will Transfer to  Toilet: with modified independence;ambulating Pt Will Perform Tub/Shower Transfer: Shower transfer;ambulating;with supervision;rolling walker;shower seat  OT Frequency: Min 2X/week   Barriers to D/C:            Co-evaluation              End of Session Equipment Utilized During Treatment: Gait belt;Rolling walker  Activity Tolerance: Patient limited by pain Patient left: in chair;with call bell/phone within reach   Time: 1125-1138 OT Time Calculation (min): 13 min Charges:  OT General Charges $OT Visit: 1 Procedure OT Evaluation $Initial OT Evaluation Tier I: 1 Procedure G-CodesBenito Mccreedy OTR/L [id number] 06/09/2014, 1:05 PM

## 2014-06-09 NOTE — Anesthesia Postprocedure Evaluation (Signed)
  Anesthesia Post-op Note  Patient: Theresa Harrington  Procedure(s) Performed: Procedure(s) (LRB): Left Femoral and Popliteal Exposure; Left Femoral to Anterior Tibial Bypass Graft using Propaten 44mm by 80cm Goretex Graft; Left Tibial Endarterectomy; Left Femoraland Popliteal Thrombectomy  (Left)  Patient Location: PACU  Anesthesia Type: General  Level of Consciousness: awake and alert   Airway and Oxygen Therapy: Patient Spontanous Breathing  Post-op Pain: mild  Post-op Assessment: Post-op Vital signs reviewed, Patient's Cardiovascular Status Stable, Respiratory Function Stable, Patent Airway and No signs of Nausea or vomiting  Last Vitals:  Filed Vitals:   06/09/14 2200  BP: 137/117  Pulse: 63  Temp: 36 C  Resp: 14    Post-op Vital Signs: stable   Complications: No apparent anesthesia complications

## 2014-06-09 NOTE — Progress Notes (Signed)
    Subjective  - POD #1  Complaining of pain in her left foot   Physical Exam:  Incisions are intact Monophasic Doppler signals       Assessment/Plan:  POD #1  I was present and per dissipated and duplex evaluation of her bypass graft.  Earlier today she had ankle-brachial indices that had not change from her preoperative study.  I duplexed her leg and was unable to identify a patent bypass graft.  I suspect that her bypass graft has occluded.  Therefore I discussed with the patient going back to the operating room for an attempt at thrombectomy versus replacement of the vein graft with a synthetic bypass.  All the patient's questions were answered.  I discussed this with her family.  Informed consent was obtained  BRABHAM IV, V. WELLS 06/09/2014 5:35 PM --  Filed Vitals:   06/09/14 1703  BP: 113/54  Pulse: 64  Temp: 98.1 F (36.7 C)  Resp: 18    Intake/Output Summary (Last 24 hours) at 06/09/14 1735 Last data filed at 06/09/14 1730  Gross per 24 hour  Intake   1285 ml  Output    800 ml  Net    485 ml     Laboratory CBC    Component Value Date/Time   WBC 13.0* 06/09/2014 0027   HGB 10.6* 06/09/2014 0027   HCT 32.9* 06/09/2014 0027   PLT 282 06/09/2014 0027    BMET    Component Value Date/Time   NA 137 06/09/2014 0027   K 4.3 06/09/2014 0027   CL 103 06/09/2014 0027   CO2 27 06/09/2014 0027   GLUCOSE 166* 06/09/2014 0027   BUN 16 06/09/2014 0027   CREATININE 0.83 06/09/2014 0027   CALCIUM 8.9 06/09/2014 0027   GFRNONAA 77* 06/09/2014 0027   GFRAA 89* 06/09/2014 0027    COAG Lab Results  Component Value Date   INR 1.03 06/01/2014   No results found for: PTT  Antibiotics Anti-infectives    Start     Dose/Rate Route Frequency Ordered Stop   06/08/14 1800  vancomycin (VANCOCIN) IVPB 1000 mg/200 mL premix     1,000 mg200 mL/hr over 60 Minutes Intravenous Every 12 hours 06/08/14 1643 06/09/14 0554   06/08/14 0730  vancomycin (VANCOCIN) IVPB  1000 mg/200 mL premix     1,000 mg200 mL/hr over 60 Minutes Intravenous  Once 06/08/14 0723 06/08/14 0825   06/08/14 0723  vancomycin (VANCOCIN) 1 GM/200ML IVPB    Comments:  Luciana Axe   : cabinet override      06/08/14 0723 06/08/14 1929   06/01/14 2200  piperacillin-tazobactam (ZOSYN) IVPB 3.375 g  Status:  Discontinued     3.375 g12.5 mL/hr over 240 Minutes Intravenous 3 times per day 06/01/14 1814 06/04/14 1618   06/01/14 2000  vancomycin (VANCOCIN) IVPB 1000 mg/200 mL premix  Status:  Discontinued     1,000 mg200 mL/hr over 60 Minutes Intravenous Every 12 hours 06/01/14 1904 06/04/14 1640       V. Leia Alf, M.D. [facility] Office: [phone] Pager:  [phone]

## 2014-06-10 ENCOUNTER — Encounter (HOSPITAL_COMMUNITY): Payer: Self-pay | Admitting: Surgery

## 2014-06-10 DIAGNOSIS — I739 Peripheral vascular disease, unspecified: Secondary | ICD-10-CM

## 2014-06-10 LAB — GLUCOSE, CAPILLARY
GLUCOSE-CAPILLARY: 139 mg/dL — AB (ref 70–99)
GLUCOSE-CAPILLARY: 182 mg/dL — AB (ref 70–99)
Glucose-Capillary: 107 mg/dL — ABNORMAL HIGH (ref 70–99)
Glucose-Capillary: 114 mg/dL — ABNORMAL HIGH (ref 70–99)
Glucose-Capillary: 144 mg/dL — ABNORMAL HIGH (ref 70–99)
Glucose-Capillary: 117 mg/dL — ABNORMAL HIGH (ref 70–99)

## 2014-06-10 LAB — HEPARIN LEVEL (UNFRACTIONATED)
Heparin Unfractionated: 0.28 IU/mL — ABNORMAL LOW (ref 0.30–0.70)
Heparin Unfractionated: 0.24 IU/mL — ABNORMAL LOW (ref 0.30–0.70)

## 2014-06-10 MED ORDER — HEPARIN (PORCINE) IN NACL 100-0.45 UNIT/ML-% IJ SOLN
1400.0000 [IU]/h | INTRAMUSCULAR | Status: DC
  Administered 2014-06-10: 1100 [IU]/h via INTRAVENOUS
  Administered 2014-06-10 – 2014-06-12 (×3): 1400 [IU]/h via INTRAVENOUS
  Filled 2014-06-10 (×6): qty 250

## 2014-06-10 MED ORDER — SODIUM CHLORIDE 0.9 % IV BOLUS (SEPSIS)
250.0000 mL | Freq: Once | INTRAVENOUS | Status: AC
  Administered 2014-06-10: 250 mL via INTRAVENOUS

## 2014-06-10 MED ORDER — SODIUM CHLORIDE 0.9 % IV SOLN
INTRAVENOUS | Status: DC
  Administered 2014-06-10 – 2014-06-12 (×3): via INTRAVENOUS

## 2014-06-10 NOTE — Progress Notes (Signed)
Patient manual BP 90/38. Patient states does not feel dizzy or light headed. The patient's nurse notified vascular surgery and received orders from Dr. Kellie Simmering to administer 250 mL bolus and continuous fluids at 28mL/hour. Vascular sites clean, dry and intact. Pulses noted in Left foot via the doppler.   Will continue to monitor closely.   Theresa Harrington

## 2014-06-10 NOTE — Progress Notes (Signed)
ANTICOAGULATION CONSULT NOTE - Initial Consult  Pharmacy Consult for Heparin Indication: DVT s/p thrombectomy  Allergies  Allergen Reactions  . Amlodipine     edema  . Atorvastatin     muscle aches  . Ampicillin Nausea And Vomiting  . Codeine Nausea And Vomiting  . Lisinopril Other (See Comments)     cough  . Penicillins Nausea And Vomiting  . Januvia [Sitagliptin]     nausea    Patient Measurements: Height: 5\' 5"  (165.1 cm) Weight: 180 lb 12.4 oz (82 kg) IBW/kg (Calculated) : 57 Heparin Dosing Weight: 75 kg  Vital Signs: Temp: 96.8 F (36 C) (02/09 2200) Temp Source: Oral (02/09 1703) BP: 95/41 mmHg (02/09 2345) Pulse Rate: 59 (02/09 2345)  Labs:  Recent Labs  06/08/14 0450 06/08/14 1905 06/09/14 0027  HGB  --  11.0* 10.6*  HCT  --  33.6* 32.9*  PLT  --  276 282  CREATININE 0.96 0.91 0.83    Estimated Creatinine Clearance: 79.1 mL/min (by C-G formula based on Cr of 0.83).   Medical History: Past Medical History  Diagnosis Date  . Hyperlipidemia   . Hypertension   . PONV (postoperative nausea and vomiting)   . Right ureteral stone   . PAD (peripheral artery disease)   . Atherosclerosis of aorta   . GERD (gastroesophageal reflux disease)   . Asymptomatic cholelithiasis   . Peripheral arterial occlusive disease     lower extremities  . Wears glasses   . Hypothyroidism     no meds  . Kidney stones   . Type 2 diabetes mellitus   . Anemia     Medications:  Prescriptions prior to admission  Medication Sig Dispense Refill Last Dose  . Canagliflozin (INVOKANA) 300 MG TABS Take 1 tablet (300 mg total) by mouth every morning. 30 tablet 11 06/01/2014 at Unknown time  . ezetimibe (ZETIA) 10 MG tablet Take 1 tablet (10 mg total) by mouth daily. 90 tablet 3 05/31/2014 at Unknown time  . Ferrous Sulfate (IRON) 325 (65 FE) MG TABS Take 1 tablet by mouth daily.    06/01/2014 at Unknown time  . gabapentin (NEURONTIN) 300 MG capsule Take 1 capsule (300 mg total) by  mouth 3 (three) times daily. (Patient taking differently: Take 300 mg by mouth 3 (three) times daily. Taking different. Take twice daily) 90 capsule 11 06/01/2014 at Unknown time  . glyBURIDE (DIABETA) 5 MG tablet TAKE ONE TABLET BY MOUTH ONCE DAILY 90 tablet 3 06/01/2014 at Unknown time  . Insulin Detemir (LEVEMIR FLEXTOUCH) 100 UNIT/ML Pen Inject 30 Units into the skin daily at 10 pm. DX 250.72 15 mL 11 05/31/2014 at Unknown time  . losartan-hydrochlorothiazide (HYZAAR) 100-12.5 MG per tablet Take 1 tablet by mouth daily. 90 tablet 3 05/31/2014 at Unknown time  . Magnesium Oxide 400 MG CAPS Take 1 capsule (400 mg total) by mouth daily. 90 capsule 3 06/01/2014 at Unknown time  . metFORMIN (GLUCOPHAGE) 1000 MG tablet Take 1,000 mg by mouth 2 (two) times daily.   06/01/2014 at Unknown time  . pantoprazole (PROTONIX) 40 MG tablet Take 40 mg by mouth every morning.    06/01/2014 at Unknown time  . Pitavastatin Calcium (LIVALO) 2 MG TABS Take 1 tablet (2 mg total) by mouth daily. 30 tablet 11 05/31/2014 at Unknown time  . Insulin Pen Needle (NOVOFINE PLUS) 32G X 4 MM MISC Use to inject once a day Dx 250.00 100 each 3 Taking  . [DISCONTINUED] chlorpheniramine-HYDROcodone ([place]) 10-8 MG/5ML  LQCR Take 5 mLs by mouth every 12 (twelve) hours as needed for cough. (Patient not taking: Reported on 06/01/2014) 115 mL 0 Not Taking at Unknown time    Assessment: 59 yo female with occluded LLE bypass graft s/p redo graft and  thrombectomy for heparin.  Total of 9500 units IV heparin given during surgery   Goal of Therapy:  Heparin level 0.3-0.7 units/ml Monitor platelets by anticoagulation protocol: Yes   Plan:  Start heparin 1100 units/hr Check heparin level in 8 hours after starting infusion  Willett Lefeber, Bronson Curb 06/10/2014,12:16 AM

## 2014-06-10 NOTE — Progress Notes (Signed)
Physical Therapy Treatment Patient Details Name: Theresa Harrington MRN: [id number] DOB: July 16, 1955 Today's Date: 06/10/2014    History of Present Illness pt presents with L Fem pop and then re-do of fem-pop.      PT Comments    Pt needs max encouragement for mobility and ambulation.  Strong pt ed about importance of mobility as pt was in bed and had not been OOB yet today per pt.  When asked about why pt had not been up as it was almost noon, pt states because "no one made me get up".  Pt ed on being OOB for all meals, ambulating into bathroom, and ambulating in hall 2-3 times per day.  Will continue to follow.    Follow Up Recommendations  Home health PT;Supervision - Intermittent     Equipment Recommendations  Rolling walker with 5" wheels    Recommendations for Other Services       Precautions / Restrictions Precautions Precautions: Fall Restrictions Weight Bearing Restrictions: No    Mobility  Bed Mobility Overal bed mobility: Needs Assistance Bed Mobility: Supine to Sit     Supine to sit: Supervision     General bed mobility comments: pt needs increased time and utilizes bed rail to A with coming up to sitting.    Transfers Overall transfer level: Needs assistance Equipment used: Rolling walker (2 wheeled) Transfers: Sit to/from Stand Sit to Stand: Min guard         General transfer comment: pt demos good use of UEs, but cues for positioning L LE.    Ambulation/Gait Ambulation/Gait assistance: Min guard Ambulation Distance (Feet): 40 Feet Assistive device: Rolling walker (2 wheeled) Gait Pattern/deviations: Step-to pattern;Decreased step length - right;Decreased stance time - left;Trunk flexed     General Gait Details: pt indicates increased pain today and increased difficulty with L knee extension.  pt needs encouragement for mobility.     Stairs            Wheelchair Mobility    Modified Rankin (Stroke Patients Only)       Balance Overall  balance assessment: Needs assistance Sitting-balance support: No upper extremity supported;Feet supported Sitting balance-Leahy Scale: Fair     Standing balance support: Bilateral upper extremity supported;During functional activity Standing balance-Leahy Scale: Poor                      Cognition Arousal/Alertness: Awake/alert Behavior During Therapy: WFL for tasks assessed/performed Overall Cognitive Status: Within Functional Limits for tasks assessed                      Exercises      General Comments        Pertinent Vitals/Pain Pain Assessment: 0-10 Pain Score: 8  Pain Location: L LE Pain Descriptors / Indicators: Aching;Burning Pain Intervention(s): Monitored during session;Premedicated before session;Repositioned    Home Living                      Prior Function            PT Goals (current goals can now be found in the care plan section) Acute Rehab PT Goals Patient Stated Goal: Home PT Goal Formulation: With patient Time For Goal Achievement: 06/16/14 Potential to Achieve Goals: Good Progress towards PT goals: Progressing toward goals    Frequency  Min 3X/week    PT Plan Current plan remains appropriate    Co-evaluation  End of Session Equipment Utilized During Treatment: Gait belt Activity Tolerance: Patient tolerated treatment well Patient left: in chair;with call bell/phone within reach     Time: 1153-1214 PT Time Calculation (min) (ACUTE ONLY): 21 min  Charges:  $Gait Training: 8-22 mins                    G CodesCatarina Hartshorn, [place] 06/10/2014, 3:17 PM

## 2014-06-10 NOTE — Progress Notes (Signed)
Called by [name] about repeat ABI's.  Discussed with Dr. Kellie Simmering and no action at this time.  Theresa Harrington  06/10/2014 4:36 PM

## 2014-06-10 NOTE — Progress Notes (Signed)
Foley removed per MD order, pt tolerated well. Will continue to monitor.  Merlene Laughter RN to witness removal  Judith Part SN

## 2014-06-10 NOTE — Progress Notes (Signed)
VASCULAR LAB PRELIMINARY  ARTERIAL  ABI completed:    RIGHT    LEFT    PRESSURE WAVEFORM  PRESSURE WAVEFORM  BRACHIAL 115 triphasic BRACHIAL  triphasic  DP   DP    AT 47 Dampened monophasic AT 91 monophasic  PT 62 Dampened monophasic PT 104 Dampened monophasic  PER   PER    GREAT TOE  NA GREAT TOE  NA    RIGHT LEFT  ABI 0.54 0.9     Inessa Wardrop, RVT 06/10/2014, 1:07 PM

## 2014-06-10 NOTE — Progress Notes (Signed)
  Progress Note    06/10/2014 7:34 AM 1 Day Post-Op  Subjective:  Sleeping-awakes to voice.    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   CBC    Component Value Date/Time   WBC 13.0* 06/09/2014 0027   RBC 3.62* 06/09/2014 0027   HGB 10.6* 06/09/2014 0027   HCT 32.9* 06/09/2014 0027   PLT 282 06/09/2014 0027   MCV 90.9 06/09/2014 0027   MCH 29.3 06/09/2014 0027   MCHC 32.2 06/09/2014 0027   RDW 13.9 06/09/2014 0027   LYMPHSABS 2.7 10/28/2013 1141   MONOABS 0.5 10/28/2013 1141   EOSABS 0.2 10/28/2013 1141   BASOSABS 0.0 10/28/2013 1141    BMET    Component Value Date/Time   NA 137 06/09/2014 0027   K 4.3 06/09/2014 0027   CL 103 06/09/2014 0027   CO2 27 06/09/2014 0027   GLUCOSE 143* 06/09/2014 2024   BUN 16 06/09/2014 0027   CREATININE 0.83 06/09/2014 0027   CALCIUM 8.9 06/09/2014 0027   GFRNONAA 77* 06/09/2014 0027   GFRAA 89* 06/09/2014 0027    INR    Component Value Date/Time   INR 1.03 06/01/2014 1902     Intake/Output Summary (Last 24 hours) at 06/10/14 0734 Last data filed at 06/10/14 0535  Gross per 24 hour  Intake   2115 ml  Output   1700 ml  Net    415 ml     Assessment:  59 y.o. female is s/p:  #1: Left femoral to below knee popliteal artery bypass graft with 6 mm propatent #2: Endarterectomy, left anterior tibial arte #3: Redo left common femoral and popliteal artery exposure #4: Thrombectomy left popliteal artery  1 Day Post-Op  Plan: -pt with patent bypass graft with palpable left DP and biphasic PT/AT/peroneal -DVT prophylaxis:  Heparin gtt -will start Plavix in a couple of days to help with graft  patency -increase mobilization -will repeat ABI's today -transfer to 2 west -dc foley -check labs in am   Theresa Locket, PA-C [facility] [phone] 06/10/2014 7:34 AM    I have examined the patient, reviewed and agree with above. Comfortable.  Palp DP pulse  Tressie Ragin, MD 06/10/2014 2:16 PM

## 2014-06-10 NOTE — Progress Notes (Signed)
ANTICOAGULATION CONSULT NOTE - Follow Up Consult  Pharmacy Consult for Heparin Indication: DVT s/p thrombectomy  Patient Measurements: Height: 5\' 5"  (165.1 cm) Weight: 180 lb 12.4 oz (82 kg) IBW/kg (Calculated) : 57 Heparin Dosing Weight: 75 kg  Vital Signs: Temp: 97.9 F (36.6 C) (02/10 1606) Temp Source: Oral (02/10 1606) BP: 102/46 mmHg (02/10 1606) Pulse Rate: 61 (02/10 1606)  Labs:  Recent Labs  06/08/14 0450 06/08/14 1905 06/09/14 0027 06/10/14 1000 06/10/14 1900  HGB  --  11.0* 10.6*  --   --   HCT  --  33.6* 32.9*  --   --   PLT  --  276 282  --   --   HEPARINUNFRC  --   --   --  0.28* 0.24*  CREATININE 0.96 0.91 0.83  --   --     Estimated Creatinine Clearance: 79.1 mL/min (by C-G formula based on Cr of 0.83).  Assessment:   Heparin level is subtherapeutic (0.24) tonight on 1200 units/hr. Level dropped from 0.28, despite increase from 1100 units/hr earlier today.  RN reports no known infusion problems.  POD# 1 re-do graft and thrombectomy.  Plavix to begin in a few days.  Goal of Therapy:  Heparin level 0.3-0.7 units/ml Monitor platelets by anticoagulation protocol: Yes   Plan:   Increase heparin drip to 1400 units/hr.  Next heparin level and CBC in am.  Arty Baumgartner, [place] Pager: [phone] 06/10/2014,9:00 PM

## 2014-06-10 NOTE — Progress Notes (Signed)
[facility] for Heparin Indication: DVT s/p thrombectomy  Allergies  Allergen Reactions  . Amlodipine     edema  . Atorvastatin     muscle aches  . Ampicillin Nausea And Vomiting  . Codeine Nausea And Vomiting  . Lisinopril Other (See Comments)     cough  . Penicillins Nausea And Vomiting  . Januvia [Sitagliptin]     nausea    Patient Measurements: Height: 5\' 5"  (165.1 cm) Weight: 180 lb 12.4 oz (82 kg) IBW/kg (Calculated) : 57 Heparin Dosing Weight: 75 kg  Vital Signs: Temp: 98.4 F (36.9 C) (02/10 1133) Temp Source: Oral (02/10 1133) BP: 105/47 mmHg (02/10 0708) Pulse Rate: 56 (02/10 0708)  Labs:  Recent Labs  06/08/14 0450 06/08/14 1905 06/09/14 0027 06/10/14 1000  HGB  --  11.0* 10.6*  --   HCT  --  33.6* 32.9*  --   PLT  --  276 282  --   HEPARINUNFRC  --   --   --  0.28*  CREATININE 0.96 0.91 0.83  --     Estimated Creatinine Clearance: 79.1 mL/min (by C-G formula based on Cr of 0.83).     Assessment: 59 yo female with occluded LLE bypass graft s/p redo graft and  thrombectomy for heparin.  Total of 9500 units IV heparin given during surgery. Heparin level 0.28 drawn 8 hours after heparin drip started at 1100 units/hr at 02 am.  HL slightly lower than goal.  CBC stable.  MD plans to start plavix in a couple of days to help with graft patency.   Goal of Therapy:  Heparin level 0.3-0.7 units/ml Monitor platelets by anticoagulation protocol: Yes   Plan:  - increase heparin drip to 1200 units/hr and check HL in 6 hours -daily HL and CBC  Eudelia Bunch, Pharm.D. [phone] 06/10/2014 11:54 AM

## 2014-06-10 NOTE — Progress Notes (Signed)
Pt transferred to 2W 29 per MD order. Report called to receiving nurse and all questions answered.   Judith Part SN

## 2014-06-10 NOTE — Progress Notes (Signed)
Pt arrived from PACU around 0030, she is awake a/ox4. Incisions to LLE clean dry and intact (Dermabond), no complaints of pain at this time. Palpable pulses, also Dopplered. Foot is red and warm with black second toe. Oriented pt to room, instructed to call for assistance as needed, will continue monitoring

## 2014-06-10 NOTE — Progress Notes (Signed)
Patient BP 96/46 at 2124 and temp noted at 100.1  Notified Rapid response nurse (she had rounded on patient earlier) and she reccommended bladder scanning and encouraging oral fluids. Foley was removed today.  The patient's nurse encouraged incentive spirometry to aid in lowering of temperature and patient verbalized and demonstrated understanding.  Bladder scanner revealed only 61mL of urine. Patient voided a very small amount.   Will continue to monitor closely.   Theresa Harrington

## 2014-06-11 LAB — BASIC METABOLIC PANEL
ANION GAP: 9 (ref 5–15)
BUN: 14 mg/dL (ref 6–23)
CO2: 26 mmol/L (ref 19–32)
Calcium: 8.7 mg/dL (ref 8.4–10.5)
Chloride: 106 mmol/L (ref 96–112)
Creatinine, Ser: 0.93 mg/dL (ref 0.50–1.10)
GFR calc Af Amer: 78 mL/min — ABNORMAL LOW (ref >90)
GFR, EST NON AFRICAN AMERICAN: 67 mL/min — AB (ref >90)
Glucose, Bld: 74 mg/dL (ref 70–99)
Potassium: 3.5 mmol/L (ref 3.5–5.1)
SODIUM: 141 mmol/L (ref 135–145)

## 2014-06-11 LAB — GLUCOSE, CAPILLARY
Glucose-Capillary: 70 mg/dL (ref 70–99)
Glucose-Capillary: 135 mg/dL — ABNORMAL HIGH (ref 70–99)
Glucose-Capillary: 111 mg/dL — ABNORMAL HIGH (ref 70–99)

## 2014-06-11 LAB — CBC
HEMATOCRIT: 33 % — AB (ref 36.0–46.0)
Hemoglobin: 10.6 g/dL — ABNORMAL LOW (ref 12.0–15.0)
MCH: 29.1 pg (ref 26.0–34.0)
MCHC: 32.1 g/dL (ref 30.0–36.0)
MCV: 90.7 fL (ref 78.0–100.0)
Platelets: 300 10*3/uL (ref 150–400)
RBC: 3.64 MIL/uL — ABNORMAL LOW (ref 3.87–5.11)
RDW: 13.7 % (ref 11.5–15.5)
WBC: 12.4 10*3/uL — ABNORMAL HIGH (ref 4.0–10.5)

## 2014-06-11 LAB — HEPARIN LEVEL (UNFRACTIONATED): HEPARIN UNFRACTIONATED: 0.42 [IU]/mL (ref 0.30–0.70)

## 2014-06-11 MED ORDER — PIPERACILLIN-TAZOBACTAM 3.375 G IVPB
3.3750 g | Freq: Three times a day (TID) | INTRAVENOUS | Status: DC
  Administered 2014-06-11 – 2014-06-13 (×6): 3.375 g via INTRAVENOUS
  Filled 2014-06-11 (×9): qty 50

## 2014-06-11 MED ORDER — VANCOMYCIN HCL IN DEXTROSE 1-5 GM/200ML-% IV SOLN
1000.0000 mg | Freq: Two times a day (BID) | INTRAVENOUS | Status: DC
  Administered 2014-06-11 – 2014-06-13 (×4): 1000 mg via INTRAVENOUS
  Filled 2014-06-11 (×6): qty 200

## 2014-06-11 NOTE — Progress Notes (Signed)
ANTICOAGULATION CONSULT NOTE - Follow Up Consult  Pharmacy Consult for Heparin Indication: DVT s/p thrombectomy  Patient Measurements: Height: 5\' 5"  (165.1 cm) Weight: 180 lb 12.4 oz (82 kg) IBW/kg (Calculated) : 57 Heparin Dosing Weight: 75 kg  Vital Signs: Temp: 99.1 F (37.3 C) (02/11 0445) Temp Source: Oral (02/11 0445) BP: 116/52 mmHg (02/11 0445) Pulse Rate: 60 (02/11 0445)  Labs:  Recent Labs  06/08/14 1905 06/09/14 0027 06/10/14 1000 06/10/14 1900 06/11/14 0607  HGB 11.0* 10.6*  --   --  10.6*  HCT 33.6* 32.9*  --   --  33.0*  PLT 276 282  --   --  300  HEPARINUNFRC  --   --  0.28* 0.24* 0.42  CREATININE 0.91 0.83  --   --  0.93    Estimated Creatinine Clearance: 70.6 mL/min (by C-G formula based on Cr of 0.93).  Assessment:   Heparin level is therapeutic 0.42 this am at 1400 units/hr.  POD# 2 re-do graft and thrombectomy.  Plavix to begin in a few days.  Goal of Therapy:  Heparin level 0.3-0.7 units/ml Monitor platelets by anticoagulation protocol: Yes   Plan:   Continue heparin drip at 1400 units/hr.  Next heparin level  In AM and CBC in am.  Theresa Harrington, Theresa Harrington Pager: [phone] 06/11/2014,9:47 AM

## 2014-06-11 NOTE — Progress Notes (Signed)
    Subjective  - POD #2&3  Feeling better today   Physical Exam:  Excellent Doppler signals in the left dorsalis pedis and posterior tibial artery. Wound between her second and third toe was cleaned today and a dry gauze placed. Erythema still on the second and third toe extending onto the dorsum of the foot       Assessment/Plan:    Restart IV Vanco and Zosyn Continue IV heparin, we will likely transitioned to Plavix in the next few days Continue with mobilization  BRABHAM IV, V. WELLS 06/11/2014 1:29 PM --  Filed Vitals:   06/11/14 1047  BP: 100/51  Pulse: 60  Temp:   Resp:     Intake/Output Summary (Last 24 hours) at 06/11/14 1329 Last data filed at 06/11/14 1026  Gross per 24 hour  Intake    264 ml  Output      0 ml  Net    264 ml     Laboratory CBC    Component Value Date/Time   WBC 12.4* 06/11/2014 0607   HGB 10.6* 06/11/2014 0607   HCT 33.0* 06/11/2014 0607   PLT 300 06/11/2014 0607    BMET    Component Value Date/Time   NA 141 06/11/2014 0607   K 3.5 06/11/2014 0607   CL 106 06/11/2014 0607   CO2 26 06/11/2014 0607   GLUCOSE 74 06/11/2014 0607   BUN 14 06/11/2014 0607   CREATININE 0.93 06/11/2014 0607   CALCIUM 8.7 06/11/2014 0607   GFRNONAA 67* 06/11/2014 0607   GFRAA 78* 06/11/2014 0607    COAG Lab Results  Component Value Date   INR 1.03 06/01/2014   No results found for: PTT  Antibiotics Anti-infectives    Start     Dose/Rate Route Frequency Ordered Stop   06/09/14 1817  vancomycin (VANCOCIN) 1 GM/200ML IVPB    Comments:  Sampson Si   : cabinet override      06/09/14 1817 06/10/14 0629   06/08/14 1800  vancomycin (VANCOCIN) IVPB 1000 mg/200 mL premix     1,000 mg 200 mL/hr over 60 Minutes Intravenous Every 12 hours 06/08/14 1643 06/09/14 0554   06/08/14 0730  vancomycin (VANCOCIN) IVPB 1000 mg/200 mL premix     1,000 mg 200 mL/hr over 60 Minutes Intravenous  Once 06/08/14 0723 06/08/14 0825   06/08/14 0723   vancomycin (VANCOCIN) 1 GM/200ML IVPB    Comments:  Luciana Axe   : cabinet override      06/08/14 0723 06/08/14 1929   06/01/14 2200  piperacillin-tazobactam (ZOSYN) IVPB 3.375 g  Status:  Discontinued     3.375 g 12.5 mL/hr over 240 Minutes Intravenous 3 times per day 06/01/14 1814 06/04/14 1618   06/01/14 2000  vancomycin (VANCOCIN) IVPB 1000 mg/200 mL premix  Status:  Discontinued     1,000 mg 200 mL/hr over 60 Minutes Intravenous Every 12 hours 06/01/14 1904 06/04/14 1640       V. Leia Alf, M.D. [facility] [place] Office: [phone] Pager:  [phone]

## 2014-06-11 NOTE — Progress Notes (Signed)
Subjective: Interval History: none.. More comfortable from his incisional soreness  Objective: Vital signs in last 24 hours: Temp:  [97.9 F (36.6 C)-100.1 F (37.8 C)] 99.1 F (37.3 C) (02/11 0445) Pulse Rate:  [55-66] 60 (02/11 0445) Resp:  [14-19] 19 (02/11 0445) BP: (89-116)/(38-57) 116/52 mmHg (02/11 0445) SpO2:  [91 %-95 %] 93 % (02/11 0445)  Intake/Output from previous day: 02/10 0701 - 02/11 0700 In: 91 [I.V.:91] Out: 750 [Urine:750] Intake/Output this shift:    Incisions all look good with no hematoma. Foot well-perfused.  Lab Results:  Recent Labs  06/08/14 1905 06/09/14 0027  WBC 16.5* 13.0*  HGB 11.0* 10.6*  HCT 33.6* 32.9*  PLT 276 282   BMET  Recent Labs  06/08/14 1905 06/09/14 0027 06/09/14 2024  NA  --  137  --   K  --  4.3  --   CL  --  103  --   CO2  --  27  --   GLUCOSE  --  166* 143*  BUN  --  16  --   CREATININE 0.91 0.83  --   CALCIUM  --  8.9  --     Studies/Results: No results found. Anti-infectives: Anti-infectives    Start     Dose/Rate Route Frequency Ordered Stop   06/09/14 1817  vancomycin (VANCOCIN) 1 GM/200ML IVPB    Comments:  Sampson Si   : cabinet override      06/09/14 1817 06/10/14 0629   06/08/14 1800  vancomycin (VANCOCIN) IVPB 1000 mg/200 mL premix     1,000 mg 200 mL/hr over 60 Minutes Intravenous Every 12 hours 06/08/14 1643 06/09/14 0554   06/08/14 0730  vancomycin (VANCOCIN) IVPB 1000 mg/200 mL premix     1,000 mg 200 mL/hr over 60 Minutes Intravenous  Once 06/08/14 0723 06/08/14 0825   06/08/14 0723  vancomycin (VANCOCIN) 1 GM/200ML IVPB    Comments:  Luciana Axe   : cabinet override      06/08/14 0723 06/08/14 1929   06/01/14 2200  piperacillin-tazobactam (ZOSYN) IVPB 3.375 g  Status:  Discontinued     3.375 g 12.5 mL/hr over 240 Minutes Intravenous 3 times per day 06/01/14 1814 06/04/14 1618   06/01/14 2000  vancomycin (VANCOCIN) IVPB 1000 mg/200 mL premix  Status:  Discontinued     1,000  mg 200 mL/hr over 60 Minutes Intravenous Every 12 hours 06/01/14 1904 06/04/14 1640      Assessment/Plan: s/p Procedure(s): Left Femoral and Popliteal Exposure; Left Femoral to Anterior Tibial Bypass Graft using Propaten 70mm by 80cm Goretex Graft; Left Tibial Endarterectomy; Left Femoraland Popliteal Thrombectomy  (Left) Continue to mobilize. Home when comfortable walking. Will check a need for heparin drip with Dr. Trula Slade.   LOS: 10 days   Porter Moes 06/11/2014, 7:07 AM

## 2014-06-11 NOTE — Progress Notes (Addendum)
ANTIBIOTIC CONSULT NOTE - INITIAL  Pharmacy Consult for Vancomycin and Zosyn Indication: Left foot cellulitis (Wound/graft)  Allergies  Allergen Reactions  . Amlodipine     edema  . Atorvastatin     muscle aches  . Ampicillin Nausea And Vomiting  . Codeine Nausea And Vomiting  . Lisinopril Other (See Comments)     cough  . Penicillins Nausea And Vomiting  . Januvia [Sitagliptin]     nausea    Patient Measurements: Height: 5\' 5"  (165.1 cm) Weight: 180 lb 12.4 oz (82 kg) IBW/kg (Calculated) : 57 Adjusted Body Weight: 67  Vital Signs: Temp: 99.1 F (37.3 C) (02/11 0445) Temp Source: Oral (02/11 0445) BP: 100/51 mmHg (02/11 1047) Pulse Rate: 60 (02/11 1047) Intake/Output from previous day: 02/10 0701 - 02/11 0700 In: 91 [I.V.:91] Out: 750 [Urine:750] Intake/Output from this shift: Total I/O In: 240 [P.O.:240] Out: -   Labs:  Recent Labs  06/08/14 1905 06/09/14 0027 06/11/14 0607  WBC 16.5* 13.0* 12.4*  HGB 11.0* 10.6* 10.6*  PLT 276 282 300  CREATININE 0.91 0.83 0.93   Estimated Creatinine Clearance: 70.6 mL/min (by C-G formula based on Cr of 0.93). No results for input(s): VANCOTROUGH, VANCOPEAK, VANCORANDOM, GENTTROUGH, GENTPEAK, GENTRANDOM, TOBRATROUGH, TOBRAPEAK, TOBRARND, AMIKACINPEAK, AMIKACINTROU, AMIKACIN in the last 72 hours.   Microbiology: Recent Results (from the past 720 hour(s))  Surgical pcr screen     Status: None   Collection Time: 06/07/14  6:41 PM  Result Value Ref Range Status   MRSA, PCR NEGATIVE NEGATIVE Final   Staphylococcus aureus NEGATIVE NEGATIVE Final    Comment:        The Xpert SA Assay (FDA approved for NASAL specimens in patients over 58 years of age), is one component of a comprehensive surveillance program.  Test performance has been validated by [facility] for patients greater than or equal to 88 year old. It is not intended to diagnose infection nor to guide or monitor treatment.     Medical History: Past  Medical History  Diagnosis Date  . Hyperlipidemia   . Hypertension   . PONV (postoperative nausea and vomiting)   . Right ureteral stone   . PAD (peripheral artery disease)   . Atherosclerosis of aorta   . GERD (gastroesophageal reflux disease)   . Asymptomatic cholelithiasis   . Peripheral arterial occlusive disease     lower extremities  . Wears glasses   . Hypothyroidism     no meds  . Kidney stones   . Type 2 diabetes mellitus   . Anemia     Medications:  Scheduled:  . canagliflozin  300 mg Oral q morning - 10a  . docusate sodium  100 mg Oral Daily  . ezetimibe  10 mg Oral Daily  . gabapentin  300 mg Oral TID  . hydrochlorothiazide  12.5 mg Oral Daily  . insulin aspart  0-15 Units Subcutaneous TID WC  . insulin detemir  30 Units Subcutaneous QHS  . loratadine  10 mg Oral Daily  . losartan  100 mg Oral Daily  . metFORMIN  1,000 mg Oral BID WC  . pantoprazole  40 mg Oral q morning - 10a  . piperacillin-tazobactam (ZOSYN)  IV  3.375 g Intravenous 3 times per day  . pravastatin  40 mg Oral q1800  . vancomycin  1,000 mg Intravenous Q12H   Assessment: 59 yo female with occluded LLE bypass graft s/p redo graft and thrombectomy. 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 Will restart antibiotics as requested by MD, zosyn at 3.375g iv q8h infused over 4 hours and Vancomycin 1gm iv q12h   Goal of Therapy:  Vancomycin trough level 10-15 mcg/ml  Plan:  Measure antibiotic drug levels at steady state  Monitor BMP and CBC    Isac Sarna, BS Pharm D, BCPS Clinical Pharmacist  06/11/2014,2:06 PM

## 2014-06-11 NOTE — Progress Notes (Signed)
Utilization review completed.  

## 2014-06-11 NOTE — Progress Notes (Signed)
    Subjective  - POD #1&2  Complaining of pain in her incisions. The shooting pain in her leg has gone   Physical Exam:  Incisions are clean and intact.  There are excellent Doppler signals in the dorsalis pedis and posterior tibial artery.       Assessment/Plan:  POD #1&2  Continue with IV heparin, we'll transitioned to Plavix and 1-2 days. Continue mobilization Continue IV antibiotics  Tzipora Mcinroy IV, V. WELLS 06/11/2014 1:27 PM --  Filed Vitals:   06/11/14 1047  BP: 100/51  Pulse: 60  Temp:   Resp:     Intake/Output Summary (Last 24 hours) at 06/11/14 1327 Last data filed at 06/11/14 1026  Gross per 24 hour  Intake    264 ml  Output      0 ml  Net    264 ml     Laboratory CBC    Component Value Date/Time   WBC 12.4* 06/11/2014 0607   HGB 10.6* 06/11/2014 0607   HCT 33.0* 06/11/2014 0607   PLT 300 06/11/2014 0607    BMET    Component Value Date/Time   NA 141 06/11/2014 0607   K 3.5 06/11/2014 0607   CL 106 06/11/2014 0607   CO2 26 06/11/2014 0607   GLUCOSE 74 06/11/2014 0607   BUN 14 06/11/2014 0607   CREATININE 0.93 06/11/2014 0607   CALCIUM 8.7 06/11/2014 0607   GFRNONAA 67* 06/11/2014 0607   GFRAA 78* 06/11/2014 0607    COAG Lab Results  Component Value Date   INR 1.03 06/01/2014   No results found for: PTT  Antibiotics Anti-infectives    Start     Dose/Rate Route Frequency Ordered Stop   06/09/14 1817  vancomycin (VANCOCIN) 1 GM/200ML IVPB    Comments:  Sampson Si   : cabinet override      06/09/14 1817 06/10/14 0629   06/08/14 1800  vancomycin (VANCOCIN) IVPB 1000 mg/200 mL premix     1,000 mg 200 mL/hr over 60 Minutes Intravenous Every 12 hours 06/08/14 1643 06/09/14 0554   06/08/14 0730  vancomycin (VANCOCIN) IVPB 1000 mg/200 mL premix     1,000 mg 200 mL/hr over 60 Minutes Intravenous  Once 06/08/14 0723 06/08/14 0825   06/08/14 0723  vancomycin (VANCOCIN) 1 GM/200ML IVPB    Comments:  Luciana Axe   : cabinet  override      06/08/14 0723 06/08/14 1929   06/01/14 2200  piperacillin-tazobactam (ZOSYN) IVPB 3.375 g  Status:  Discontinued     3.375 g 12.5 mL/hr over 240 Minutes Intravenous 3 times per day 06/01/14 1814 06/04/14 1618   06/01/14 2000  vancomycin (VANCOCIN) IVPB 1000 mg/200 mL premix  Status:  Discontinued     1,000 mg 200 mL/hr over 60 Minutes Intravenous Every 12 hours 06/01/14 1904 06/04/14 1640       V. Leia Alf, M.D. [facility] [place] Office: [phone] Pager:  [phone]

## 2014-06-11 NOTE — Progress Notes (Addendum)
Called by [name] about repeat ABI's.  Discussed with Dr. Kellie Simmering and no action at this time.    Theresa Harrington

## 2014-06-11 NOTE — Progress Notes (Signed)
Occupational Therapy Treatment Patient Details Name: Theresa Harrington MRN: [id number] DOB: Aug 08, 1955 Today's Date: 06/11/2014    History of present illness pt presents with L Fem pop and then re-do of fem-pop.     OT comments  Pt in bathroom upon OTs arrival with diarrhea and need for assist with changing gown and clean up.  Requiring min assist to supervision for ADL and ADL transfers. Pt with concerns about L middle toe.  RN and MD are aware.  Follow Up Recommendations  No OT follow up;Supervision - Intermittent    Equipment Recommendations  None recommended by OT    Recommendations for Other Services      Precautions / Restrictions Precautions Precautions: Fall Restrictions Weight Bearing Restrictions: No       Mobility Bed Mobility               General bed mobility comments: pt OOB upon OTs arrival  Transfers   Equipment used: Rolling walker (2 wheeled) Transfers: Sit to/from Stand Sit to Stand: Min guard;Supervision         General transfer comment: supervision from chair, min guard from toilet    Balance     Sitting balance-Leahy Scale: Fair       Standing balance-Leahy Scale: Poor                     ADL Overall ADL's : Needs assistance/impaired     Grooming: Wash/dry hands;Supervision/safety;Standing           Upper Body Dressing : Set up;Sitting   Lower Body Dressing: Minimal assistance;Sit to/from stand Lower Body Dressing Details (indicate cue type and reason): able to donn panties with verbal cues to dress operated leg first, assist for L sock, set up R sock Toilet Transfer: Supervision/safety;Ambulation;Comfort height toilet;Grab bars Toilet Transfer Details (indicate cue type and reason): heavy reliance on grab bar for sit to stand Toileting- Clothing Manipulation and Hygiene: Supervision/safety;Sit to/from stand       Functional mobility during ADLs: Supervision/safety;Rolling walker General ADL Comments:         Vision                     Perception     Praxis      Cognition   Behavior During Therapy: WFL for tasks assessed/performed Overall Cognitive Status: Within Functional Limits for tasks assessed                       Extremity/Trunk Assessment               Exercises     Shoulder Instructions       General Comments      Pertinent Vitals/ Pain       Pain Assessment: Faces Pain Score: 0-No pain Faces Pain Scale: Hurts even more Pain Location: L LE Pain Descriptors / Indicators: Grimacing;Guarding;Operative site guarding Pain Intervention(s): Premedicated before session  Home Living                                          Prior Functioning/Environment              Frequency Min 2X/week     Progress Toward Goals  OT Goals(current goals can now be found in the care plan section)  Progress towards OT goals: Progressing toward goals  Acute Rehab OT Goals  Patient Stated Goal: Home  Plan Discharge plan remains appropriate    Co-evaluation                 End of Session Equipment Utilized During Treatment: Gait belt;Rolling walker   Activity Tolerance Patient tolerated treatment well   Patient Left in chair;with call bell/phone within reach   Nurse Communication          Time: 1155-2080 OT Time Calculation (min): 31 min  Charges: OT General Charges $OT Visit: 1 Procedure OT Treatments $Self Care/Home Management : 23-37 mins  Malka So 06/11/2014, 12:28 PM  [phone]

## 2014-06-12 LAB — GLUCOSE, CAPILLARY
GLUCOSE-CAPILLARY: 118 mg/dL — AB (ref 70–99)
Glucose-Capillary: 105 mg/dL — ABNORMAL HIGH (ref 70–99)
Glucose-Capillary: 111 mg/dL — ABNORMAL HIGH (ref 70–99)
Glucose-Capillary: 139 mg/dL — ABNORMAL HIGH (ref 70–99)
Glucose-Capillary: 92 mg/dL (ref 70–99)

## 2014-06-12 LAB — CBC
HEMATOCRIT: 30.4 % — AB (ref 36.0–46.0)
HEMOGLOBIN: 9.7 g/dL — AB (ref 12.0–15.0)
MCH: 28.9 pg (ref 26.0–34.0)
MCHC: 31.9 g/dL (ref 30.0–36.0)
MCV: 90.5 fL (ref 78.0–100.0)
Platelets: 288 10*3/uL (ref 150–400)
RBC: 3.36 MIL/uL — ABNORMAL LOW (ref 3.87–5.11)
RDW: 13.5 % (ref 11.5–15.5)
WBC: 9.9 10*3/uL (ref 4.0–10.5)

## 2014-06-12 LAB — HEPARIN LEVEL (UNFRACTIONATED): HEPARIN UNFRACTIONATED: 0.33 [IU]/mL (ref 0.30–0.70)

## 2014-06-12 MED ORDER — CLOPIDOGREL BISULFATE 75 MG PO TABS
75.0000 mg | ORAL_TABLET | Freq: Every day | ORAL | Status: DC
  Administered 2014-06-12 – 2014-06-13 (×2): 75 mg via ORAL
  Filled 2014-06-12 (×2): qty 1

## 2014-06-12 MED ORDER — LORATADINE 10 MG PO TABS
10.0000 mg | ORAL_TABLET | Freq: Every day | ORAL | Status: DC | PRN
  Filled 2014-06-12: qty 1

## 2014-06-12 NOTE — Progress Notes (Signed)
ANTICOAGULATION CONSULT NOTE - Follow Up Consult  Pharmacy Consult for Heparin Indication: DVT s/p thrombectomy  Patient Measurements: Height: 5\' 5"  (165.1 cm) Weight: 180 lb 12.4 oz (82 kg) IBW/kg (Calculated) : 57 Heparin Dosing Weight: 75 kg  Vital Signs: Temp: 98.6 F (37 C) (02/12 0608) Temp Source: Oral (02/12 0608) BP: 100/43 mmHg (02/12 0608) Pulse Rate: 52 (02/12 0608)  Labs:  Recent Labs  06/10/14 1900 06/11/14 0607 06/12/14 0423  HGB  --  10.6* 9.7*  HCT  --  33.0* 30.4*  PLT  --  300 288  HEPARINUNFRC 0.24* 0.42 0.33  CREATININE  --  0.93  --     Estimated Creatinine Clearance: 70.6 mL/min (by C-G formula based on Cr of 0.93).  Assessment: 59 year old female continues on heparin s/p thrombectomy and graft CBC stable, HL therapeutic  Goal of Therapy:  Heparin level 0.3-0.7 units/ml Monitor platelets by anticoagulation protocol: Yes   Plan:  Continue heparin at 1400 units / hr Follow up AM labs  Thank you. Anette Guarneri, PharmD [phone] 06/12/2014,10:31 AM

## 2014-06-12 NOTE — Progress Notes (Signed)
Occupational Therapy Treatment Patient Details Name: Theresa Harrington MRN: [id number] DOB: 17-Feb-1956 Today's Date: 06/12/2014    History of present illness pt presents with L Fem pop and then re-do of fem-pop.     OT comments  Pt progressing in ability to perform LB ADL and with toilet transfers.  Pt eager to walk.  Encouraged pt to elevate L LE when not ambulating for edema management.  Follow Up Recommendations  No OT follow up;Supervision - Intermittent    Equipment Recommendations  None recommended by OT    Recommendations for Other Services      Precautions / Restrictions Precautions Precautions: Fall       Mobility Bed Mobility               General bed mobility comments: pt seated at EOB  Transfers Overall transfer level: Needs assistance Equipment used: Rolling walker (2 wheeled) Transfers: Sit to/from Stand Sit to Stand: Supervision         General transfer comment: good technique/hand placement    Balance                                   ADL Overall ADL's : Needs assistance/impaired             Lower Body Bathing: Minimal assistance;Sit to/from stand Lower Body Bathing Details (indicate cue type and reason): set up for R LE, assisted for L and instructed pt in use of long bath sponge which she currently owns     Lower Body Dressing: Minimal assistance;Sit to/from stand Lower Body Dressing Details (indicate cue type and reason): doffed L sock, min assist to donn, limited primarily by pain Toilet Transfer: Supervision/safety;Ambulation;RW;Grab Information systems manager Details (indicate cue type and reason): less reliance on grab bar, only pulled up with one hand, pt's toilet is high at home [place] and Hygiene: Supervision/safety;Sit to/from Nurse, children's Details (indicate cue type and reason): pt declined shower transfer practice, no concerns          Vision                      Perception     Praxis      Cognition   Behavior During Therapy: Flat affect Overall Cognitive Status: Within Functional Limits for tasks assessed                       Extremity/Trunk Assessment               Exercises     Shoulder Instructions       General Comments      Pertinent Vitals/ Pain       Pain Assessment: Faces Faces Pain Scale: Hurts little more Pain Location: L LE Pain Descriptors / Indicators: Operative site guarding Pain Intervention(s): Monitored during session;Premedicated before session;Repositioned  Home Living                                          Prior Functioning/Environment              Frequency       Progress Toward Goals  OT Goals(current goals can now be found in the care plan section)  Progress towards OT goals: Progressing toward goals  Plan Discharge plan remains appropriate    Co-evaluation                 End of Session Equipment Utilized During Treatment: Rolling walker   Activity Tolerance Patient tolerated treatment well   Patient Left in chair;with call bell/phone within reach   Nurse Communication          Time: 2876-8115 OT Time Calculation (min): 26 min  Charges: OT General Charges $OT Visit: 1 Procedure OT Treatments $Self Care/Home Management : 23-37 mins  Malka So 06/12/2014, 10:43 AM  [phone]

## 2014-06-12 NOTE — Progress Notes (Signed)
        Patient will be placed on Plavix 75 mg starting today per Dr. Trula Slade.  She can be discharged home when mobility is safe.  She needs a prescription for Levaquin po for 15 days with 1 refill when she is discharged.  Dr. Trula Slade wants her to follow up with him in 1 week.    Savalas Monje MAUREEN PA-C

## 2014-06-12 NOTE — Progress Notes (Addendum)
[facility] [place]  Subjective  - "PT has not worked with me since POD 1.  I just walked to the bathroom and back yesterday."   Objective 100/43 52 98.6 F (37 C) (Oral) 18 98%  Intake/Output Summary (Last 24 hours) at 06/12/14 0732 Last data filed at 06/11/14 1230  Gross per 24 hour  Intake    480 ml  Output      0 ml  Net    480 ml    Incisions healing well compartments soft Second toe tip dry gangrene, minimal erythema base of toe and dorsum of foot. Doppler signal strong DP/PT  Assessment/Planning: POD # 3 Restarted yesterday IV Vanco and Zosyn Continue IV heparin, we will likely transitioned to Plavix in the next few days per Dr. Stephens Shire ordereds I re- consulted PT Ordered rolling walker for home  Theda Sers [place] 06/12/2014 7:32 AM --  Laboratory Lab Results:  Recent Labs  06/11/14 0607 06/12/14 0423  WBC 12.4* 9.9  HGB 10.6* 9.7*  HCT 33.0* 30.4*  PLT 300 288   BMET  Recent Labs  06/09/14 2024 06/11/14 0607  NA  --  141  K  --  3.5  CL  --  106  CO2  --  26  GLUCOSE 143* 74  BUN  --  14  CREATININE  --  0.93  CALCIUM  --  8.7    COAG Lab Results  Component Value Date   INR 1.03 06/01/2014   No results found for: PTT    I have examined the patient, reviewed and agree with above.Palp AT pulse.  Concerned about leg and foot swelling.  Explained expected post op course.  Mobilize  Theresa Perkinson, MD 06/12/2014 1:26 PM

## 2014-06-12 NOTE — Progress Notes (Signed)
Physical Therapy Treatment Patient Details Name: Theresa Harrington MRN: [id number] DOB: 1955/05/14 Today's Date: 06/14/2014    History of Present Illness pt presents with L Fem pop and then re-do of fem-pop.      PT Comments    Pt progressing with gait, activity, transfers and function. Pt encouraged to have LLE knee extension at rest, continue HEP and increase mobility with nursing staff. Will continue to follow.   Follow Up Recommendations  Home health PT;Supervision - Intermittent     Equipment Recommendations  Rolling walker with 5" wheels    Recommendations for Other Services       Precautions / Restrictions Precautions Precautions: Fall Restrictions Weight Bearing Restrictions: No    Mobility  Bed Mobility Overal bed mobility: Modified Independent             General bed mobility comments: pt seated at EOB  Transfers Overall transfer level: Modified independent Equipment used: Rolling walker (2 wheeled) Transfers: Sit to/from Stand Sit to Stand: Supervision         General transfer comment: cues   Ambulation/Gait Ambulation/Gait assistance: Supervision Ambulation Distance (Feet): 300 Feet Assistive device: Rolling walker (2 wheeled) Gait Pattern/deviations: Step-to pattern;Decreased stance time - left;Narrow base of support;Trunk flexed   Gait velocity interpretation: Below normal speed for age/gender General Gait Details: cues for sequence, position in RW, increased heel strike on LLE and attempt at step through gait but pt unable to step through   Stairs Stairs: Yes Stairs assistance: Supervision Stair Management: Step to pattern;Forwards;One rail Left Number of Stairs: 3 General stair comments: cues for sequence  Wheelchair Mobility    Modified Rankin (Stroke Patients Only)       Balance                                    Cognition Arousal/Alertness: Awake/alert Behavior During Therapy: Flat affect Overall Cognitive  Status: Within Functional Limits for tasks assessed                      Exercises General Exercises - Lower Extremity Long Arc Quad: AROM;Seated;Left;15 reps Hip Flexion/Marching: AROM;Seated;Left;15 reps Toe Raises: AROM;Seated;Left;15 reps Heel Raises: AROM;Seated;Left;15 reps    General Comments        Pertinent Vitals/Pain Pain Assessment: Faces Pain Score: 4  Faces Pain Scale: Hurts little more Pain Location: LLE Pain Descriptors / Indicators: Aching;Tightness Pain Intervention(s): Repositioned    Home Living                      Prior Function            PT Goals (current goals can now be found in the care plan section) Progress towards PT goals: Progressing toward goals    Frequency       PT Plan Current plan remains appropriate    Co-evaluation             End of Session   Activity Tolerance: Patient tolerated treatment well Patient left: in bed;with call bell/phone within reach     Time: 1145-1208 PT Time Calculation (min) (ACUTE ONLY): 23 min  Charges:  $Gait Training: 8-22 mins $Therapeutic Exercise: 8-22 mins                    G Codes:      Melford Aase 2014/06/14, 12:51 PM Elwyn Reach, [place]

## 2014-06-12 NOTE — Care Management Note (Signed)
    Page 1 of 2   06/14/2014     9:10:35 AM CARE MANAGEMENT NOTE 06/14/2014  Patient:  Theresa Harrington, Theresa Harrington   Account Number:  [id number]  Date Initiated:  06/08/2014  Documentation initiated by:  Marvetta Gibbons  Subjective/Objective Assessment:   Pt admitted with ischemic foot non healing ulcer, s/p left external iliac and common femoral endarterectomy and Dacron patch angioplasty, #2 left femoral to below-knee popliteal bypass with translocated non-reversed great saphenous veinleft     Action/Plan:   PTA pt lived at home alone, will need PT/OT evals   Anticipated DC Date:  06/11/2014   Anticipated DC Plan:  [place]  CM consult      Choice offered to / List presented to:     DME arranged  [place]      DME agency  [place] arranged  [place].   Status of service:  Completed, signed off Medicare Important Message given?   (If response is "NO", the following Medicare IM given date fields will be blank) Date Medicare IM given:   Medicare IM given by:   Date Additional Medicare IM given:   Additional Medicare IM given by:    Discharge Disposition:  [place]  Per UR Regulation:  Reviewed for med. necessity/level of care/duration of stay  If discussed at [place] of Stay Meetings, dates discussed:   06/09/2014  06/11/2014    Comments:  06/13/14 Cm received call from RN who statres pt is with [facility] for HHPT.  Rolling walker to be delivered to room prior to discharge.  No other Cm needs were communicated.  CM made courtesy call to [facility] to notify of dishcarge.  Mariane Masters, BSn, [place] [phone].  06/12/14- 1640- Marvetta Gibbons RN, BSN [phone] Referral for plavix- pt has insurance- should be covered- don't anticipate any issues.

## 2014-06-13 LAB — CBC
HEMATOCRIT: 31.9 % — AB (ref 36.0–46.0)
Hemoglobin: 10 g/dL — ABNORMAL LOW (ref 12.0–15.0)
MCH: 28.5 pg (ref 26.0–34.0)
MCHC: 31.3 g/dL (ref 30.0–36.0)
MCV: 90.9 fL (ref 78.0–100.0)
PLATELETS: 301 10*3/uL (ref 150–400)
RBC: 3.51 MIL/uL — AB (ref 3.87–5.11)
RDW: 13.7 % (ref 11.5–15.5)
WBC: 10.5 10*3/uL (ref 4.0–10.5)

## 2014-06-13 LAB — GLUCOSE, CAPILLARY
GLUCOSE-CAPILLARY: 98 mg/dL (ref 70–99)
Glucose-Capillary: 83 mg/dL (ref 70–99)

## 2014-06-13 MED ORDER — LEVOFLOXACIN 500 MG PO TABS: 500.0000 mg | ORAL_TABLET | Freq: Every day | ORAL | Status: DC

## 2014-06-13 MED ORDER — OXYCODONE HCL 5 MG PO TABS: 5.0000 mg | ORAL_TABLET | Freq: Four times a day (QID) | ORAL | Status: DC | PRN

## 2014-06-13 MED ORDER — CLOPIDOGREL BISULFATE 75 MG PO TABS: 75.0000 mg | ORAL_TABLET | Freq: Every day | ORAL | Status: DC

## 2014-06-13 MED ORDER — LEVOFLOXACIN 500 MG PO TABS
500.0000 mg | ORAL_TABLET | Freq: Every day | ORAL | Status: DC
Start: 2014-06-13 — End: 2014-08-04

## 2014-06-13 NOTE — Progress Notes (Signed)
   Daily Progress Note  Assessment/Planning: POD #4 s/p L AT EA, TE L pop, L CFA to BK pop BPG with Propaten POD #5 s/p L EIA and CFA with PA, L CFA to BK pop BPG w/ NR GSV   Pain controlled  Patient ambulating adequately  PT/OT: home health  Ok to D/C home  Subjective  - 4 Days Post-Op  Pain controlled, moving ok, ok with going home  Objective Filed Vitals:   06/12/14 0608 06/12/14 1303 06/12/14 2146 06/13/14 0634  BP: 100/43 101/50 98/47 90/46   Pulse: 52 63 58 56  Temp: 98.6 F (37 C) 98 F (36.7 C) 98.7 F (37.1 C) 98 F (36.7 C)  TempSrc: Oral Oral Oral Oral  Resp: 18 18 18 18   Height:      Weight:      SpO2: 98% 98% 98% 99%    Intake/Output Summary (Last 24 hours) at 06/13/14 0844 Last data filed at 06/12/14 1807  Gross per 24 hour  Intake 2064.17 ml  Output      0 ml  Net 2064.17 ml    PULM  CTAB CV  RRR GI  soft, NTND VASC  L leg incisions c/d/i, edema 1-2+  Laboratory CBC    Component Value Date/Time   WBC 10.5 06/13/2014 0430   HGB 10.0* 06/13/2014 0430   HCT 31.9* 06/13/2014 0430   PLT 301 06/13/2014 0430    BMET    Component Value Date/Time   NA 141 06/11/2014 0607   K 3.5 06/11/2014 0607   CL 106 06/11/2014 0607   CO2 26 06/11/2014 0607   GLUCOSE 74 06/11/2014 0607   BUN 14 06/11/2014 0607   CREATININE 0.93 06/11/2014 0607   CALCIUM 8.7 06/11/2014 0607   GFRNONAA 67* 06/11/2014 0607   GFRAA 78* 06/11/2014 0607    Adele Barthel, MD [facility] [place]: [phone] Pager: [phone]  06/13/2014, 8:44 AM

## 2014-06-13 NOTE — Progress Notes (Signed)
Case Management notified for home health PT orders for discharge today. Pt also asking to see case management related to cost of Plavix. Case Management aware and to see pt. Will continue to monitor.

## 2014-06-13 NOTE — Progress Notes (Signed)
Discharged to home with family office visits in place teaching done  

## 2014-06-14 ENCOUNTER — Observation Stay (HOSPITAL_COMMUNITY)
Admission: EM | Admit: 2014-06-14 | Discharge: 2014-06-16 | Disposition: A | Discharge status: Home / Self Care | Payer: 59 | Attending: Internal Medicine | Admitting: Internal Medicine

## 2014-06-14 ENCOUNTER — Encounter (HOSPITAL_COMMUNITY): Payer: Self-pay

## 2014-06-14 DIAGNOSIS — E1159 Type 2 diabetes mellitus with other circulatory complications: Secondary | ICD-10-CM

## 2014-06-14 DIAGNOSIS — I779 Disorder of arteries and arterioles, unspecified: Secondary | ICD-10-CM | POA: Insufficient documentation

## 2014-06-14 DIAGNOSIS — D649 Anemia, unspecified: Secondary | ICD-10-CM | POA: Insufficient documentation

## 2014-06-14 DIAGNOSIS — Z794 Long term (current) use of insulin: Secondary | ICD-10-CM | POA: Diagnosis not present

## 2014-06-14 DIAGNOSIS — I739 Peripheral vascular disease, unspecified: Secondary | ICD-10-CM | POA: Insufficient documentation

## 2014-06-14 DIAGNOSIS — N200 Calculus of kidney: Secondary | ICD-10-CM | POA: Diagnosis not present

## 2014-06-14 DIAGNOSIS — K219 Gastro-esophageal reflux disease without esophagitis: Secondary | ICD-10-CM | POA: Insufficient documentation

## 2014-06-14 DIAGNOSIS — K8020 Calculus of gallbladder without cholecystitis without obstruction: Secondary | ICD-10-CM | POA: Insufficient documentation

## 2014-06-14 DIAGNOSIS — E11649 Type 2 diabetes mellitus with hypoglycemia without coma: Secondary | ICD-10-CM | POA: Diagnosis present

## 2014-06-14 DIAGNOSIS — I700 Atherosclerosis of aorta: Secondary | ICD-10-CM | POA: Insufficient documentation

## 2014-06-14 DIAGNOSIS — E079 Disorder of thyroid, unspecified: Secondary | ICD-10-CM | POA: Diagnosis present

## 2014-06-14 DIAGNOSIS — I10 Essential (primary) hypertension: Secondary | ICD-10-CM | POA: Diagnosis not present

## 2014-06-14 DIAGNOSIS — E1151 Type 2 diabetes mellitus with diabetic peripheral angiopathy without gangrene: Secondary | ICD-10-CM | POA: Diagnosis present

## 2014-06-14 DIAGNOSIS — E039 Hypothyroidism, unspecified: Secondary | ICD-10-CM | POA: Diagnosis not present

## 2014-06-14 DIAGNOSIS — Z79899 Other long term (current) drug therapy: Secondary | ICD-10-CM | POA: Diagnosis not present

## 2014-06-14 DIAGNOSIS — E785 Hyperlipidemia, unspecified: Secondary | ICD-10-CM | POA: Diagnosis not present

## 2014-06-14 DIAGNOSIS — Z87891 Personal history of nicotine dependence: Secondary | ICD-10-CM | POA: Diagnosis not present

## 2014-06-14 DIAGNOSIS — Z973 Presence of spectacles and contact lenses: Secondary | ICD-10-CM | POA: Diagnosis not present

## 2014-06-14 DIAGNOSIS — Z7982 Long term (current) use of aspirin: Secondary | ICD-10-CM | POA: Diagnosis not present

## 2014-06-14 DIAGNOSIS — E1165 Type 2 diabetes mellitus with hyperglycemia: Secondary | ICD-10-CM

## 2014-06-14 DIAGNOSIS — E876 Hypokalemia: Secondary | ICD-10-CM | POA: Diagnosis present

## 2014-06-14 DIAGNOSIS — E162 Hypoglycemia, unspecified: Secondary | ICD-10-CM

## 2014-06-14 DIAGNOSIS — I998 Other disorder of circulatory system: Secondary | ICD-10-CM | POA: Diagnosis present

## 2014-06-14 DIAGNOSIS — IMO0002 Reserved for concepts with insufficient information to code with codable children: Secondary | ICD-10-CM | POA: Diagnosis present

## 2014-06-14 LAB — URINALYSIS, ROUTINE W REFLEX MICROSCOPIC
Bilirubin Urine: NEGATIVE
Glucose, UA: >1000 mg/dL — AB
Ketones, ur: NEGATIVE mg/dL
Nitrite: NEGATIVE
Protein, ur: NEGATIVE mg/dL
Specific Gravity, Urine: 1.026 (ref 1.005–1.030)
Urobilinogen, UA: 1 mg/dL (ref 0.0–1.0)
pH: 5.5 (ref 5.0–8.0)

## 2014-06-14 LAB — COMPREHENSIVE METABOLIC PANEL
ALT: 13 U/L (ref 0–35)
AST: 19 U/L (ref 0–37)
Albumin: 2.4 g/dL — ABNORMAL LOW (ref 3.5–5.2)
Alkaline Phosphatase: 31 U/L — ABNORMAL LOW (ref 39–117)
Anion gap: 5 (ref 5–15)
BUN: 8 mg/dL (ref 6–23)
CO2: 27 mmol/L (ref 19–32)
Calcium: 9 mg/dL (ref 8.4–10.5)
Chloride: 108 mmol/L (ref 96–112)
Creatinine, Ser: 0.96 mg/dL (ref 0.50–1.10)
GFR calc Af Amer: 75 mL/min — ABNORMAL LOW (ref >90)
GFR calc non Af Amer: 64 mL/min — ABNORMAL LOW (ref >90)
Glucose, Bld: 72 mg/dL (ref 70–99)
Potassium: 3 mmol/L — ABNORMAL LOW (ref 3.5–5.1)
Sodium: 140 mmol/L (ref 135–145)
Total Bilirubin: 0.5 mg/dL (ref 0.3–1.2)
Total Protein: 6.1 g/dL (ref 6.0–8.3)

## 2014-06-14 LAB — CBC WITH DIFFERENTIAL/PLATELET
Basophils Absolute: 0 10*3/uL (ref 0.0–0.1)
Basophils Relative: 0 % (ref 0–1)
Eosinophils Absolute: 0.2 10*3/uL (ref 0.0–0.7)
Eosinophils Relative: 2 % (ref 0–5)
HCT: 28.2 % — ABNORMAL LOW (ref 36.0–46.0)
Hemoglobin: 9.2 g/dL — ABNORMAL LOW (ref 12.0–15.0)
Lymphocytes Relative: 25 % (ref 12–46)
Lymphs Abs: 2.6 10*3/uL (ref 0.7–4.0)
MCH: 28.9 pg (ref 26.0–34.0)
MCHC: 32.6 g/dL (ref 30.0–36.0)
MCV: 88.7 fL (ref 78.0–100.0)
Monocytes Absolute: 0.7 10*3/uL (ref 0.1–1.0)
Monocytes Relative: 7 % (ref 3–12)
Neutro Abs: 6.8 10*3/uL (ref 1.7–7.7)
Neutrophils Relative %: 66 % (ref 43–77)
Platelets: 329 10*3/uL (ref 150–400)
RBC: 3.18 MIL/uL — ABNORMAL LOW (ref 3.87–5.11)
RDW: 13.7 % (ref 11.5–15.5)
WBC: 10.4 10*3/uL (ref 4.0–10.5)

## 2014-06-14 LAB — URINE MICROSCOPIC-ADD ON

## 2014-06-14 MED ORDER — POTASSIUM CHLORIDE CRYS ER 20 MEQ PO TBCR
40.0000 meq | EXTENDED_RELEASE_TABLET | Freq: Once | ORAL | Status: AC
  Administered 2014-06-15: 40 meq via ORAL
  Filled 2014-06-14: qty 2

## 2014-06-14 MED ORDER — DEXTROSE 50 % IV SOLN
1.0000 | Freq: Once | INTRAVENOUS | Status: AC
  Administered 2014-06-14: 50 mL via INTRAVENOUS
  Filled 2014-06-14: qty 50

## 2014-06-14 MED ORDER — DEXTROSE 5 % IV SOLN
Freq: Once | INTRAVENOUS | Status: AC
  Administered 2014-06-14: 23:00:00 via INTRAVENOUS

## 2014-06-14 NOTE — ED Notes (Signed)
CBG 89 

## 2014-06-14 NOTE — ED Notes (Signed)
CBG 140  

## 2014-06-14 NOTE — Discharge Summary (Signed)
[facility] Discharge Summary  Theresa Harrington [birth year] 59 y.o. 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 female who has been followed by Dr. Trula Slade for left leg claudication. Her ABIs have remained stable in the 0.6 range and she has remained stable with regards to her symptoms. She recently underwent a left second toe nail removal. This has not healed and she has developed cellulitis extending up onto the dorsum of her foot associated with pain and edema. She has taken oral antibiotics and this has not helped. Therefore, I think she needs admission to the hospital for IV antibiotics. She will also need to have a angiogram to define her anatomy, as she will likely need revascularization in order to heal her wound. She is at risk for losing the toe.   The patient was admitted to the hospital on 06/01/14 for IV antibiotics. On hospital day 2, her left foot wound was stable. She was continued on IV antibiotics.  She had an arteriogram with bilateral runoff performed on 06/04/14 that revealed:  #1 bilateral common femoral artery high-grade lesions. #2 bilateral superficial femoral occlusion #3 there is reconstitution of the below knee popliteal artery on the left with a stenosis at the distal tip with three-vessel runoff #4 reconstitution of the right below-knee popliteal artery #5 patient will be considered for left femoral endarterectomy with below knee popliteal artery bypass graft  On POD 1, s/p arteriogram, her left foot erythema continued to improve. Left leg bypass was discussed with the patient but unfortunately Dr. Trula Slade did not have availability until the following Friday. She does not have cardiac history and therefore preoperative cardiac clearance was not deemed necessary. She remained stable  on PODs 2-3. Dr. Donnetta Hutching made plans to perform her surgery on 06/08/14.   On 06/08/14, he underwent left external iliac and common femoral endarterectomy and Dacron patch angioplasty and left femoral to below-knee popliteal bypass with translocated non-reversed great saphenous vein.   She tolerated the procedure well and was transported to the recovery room in stable condition. She had dopplerable left PT and DP signals  On POD 1, she was doing well and complained of less soreness to the dorsum of her foot. She was transferred to 2W. She had ABIs performed that did not reveal a change in her left ABI. Dr. Trula Slade ordered a duplex evaluation of her bypass graft that was unable to reveal a patent bypass graft.   She was taken to the operating room on 06/09/14 for left femoral to below knee popliteal artery bypass graft with 6 mm propaten, endarterectomy of left anterior tibial artery, redo left common femoral and popliteal artery exposure and thrombectomy of the left popliteal artery. She had excellent graft dependent doppler signals in the anterior tibial and posterior tibial arteries. She was started on a heparin drip.   The following day 06/10/14, she had a patent bypass graft with palpable left dorsalis pedis pulse and biphasic posterior tibial, peroneal and anterior tibial doppler signals. Her ABIs were repeated and there was a notable increase in her left ABIs. She was transferred to the floor.   On POD 2, her IV vancomycin and zosyn were restarted. She was continued on a heparin drip. She continued to have excellent doppler signals in her left DP and PT arteries. She continued to mobilize with PT until she  felt ready for discharge. She remained stable on POD 3.   She was discharged home on 06/13/14 (POD 4) with plavix and oral levaquin. Arrangements were made for home health PT.    CBC    Component Value Date/Time   WBC 10.5 06/13/2014 0430   RBC 3.51* 06/13/2014 0430   HGB 10.0* 06/13/2014 0430    HCT 31.9* 06/13/2014 0430   PLT 301 06/13/2014 0430   MCV 90.9 06/13/2014 0430   MCH 28.5 06/13/2014 0430   MCHC 31.3 06/13/2014 0430   RDW 13.7 06/13/2014 0430   LYMPHSABS 2.7 10/28/2013 1141   MONOABS 0.5 10/28/2013 1141   EOSABS 0.2 10/28/2013 1141   BASOSABS 0.0 10/28/2013 1141    BMET    Component Value Date/Time   NA 141 06/11/2014 0607   K 3.5 06/11/2014 0607   CL 106 06/11/2014 0607   CO2 26 06/11/2014 0607   GLUCOSE 74 06/11/2014 0607   BUN 14 06/11/2014 0607   CREATININE 0.93 06/11/2014 0607   CALCIUM 8.7 06/11/2014 0607   GFRNONAA 67* 06/11/2014 0607   GFRAA 78* 06/11/2014 0607     Discharge Instructions:   The patient is discharged to home with extensive instructions on wound care and progressive ambulation.  They are instructed not to drive or perform any heavy lifting until returning to see the physician in his office.  Discharge Instructions    Call MD for:  redness, tenderness, or signs of infection (pain, swelling, bleeding, redness, odor or green/yellow discharge around incision site)    Complete by:  As directed      Call MD for:  severe or increased pain, loss or decreased feeling  in affected limb(s)    Complete by:  As directed      Call MD for:  temperature >100.5    Complete by:  As directed      Discharge wound care:    Complete by:  As directed   Wash the groin wound with soap and water daily and pat dry. (No tub bath-only shower)  Then put a dry gauze or washcloth there to keep this area dry daily and as needed.  Do not use Vaseline or neosporin on your incisions.  Only use soap and water on your incisions and then protect and keep dry.  Wash all other wounds daily with soap and water and pat dry.     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 PAD (peripheral artery disease) 06/01/2014  . Swelling of limb-left 2nd toe 05/25/2014  . Gallstones 04/28/2014  . URI, acute 04/28/2014  . Nocturnal leg cramps 04/01/2014  . PVD (peripheral vascular disease) with claudication 02/20/2014  . Atherosclerosis of native arteries of the extremities with ulceration 11/14/2013  . Anemia, iron deficiency 10/28/2013  . Vitamin B12 deficiency neuropathy 10/28/2013  . 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 10/09/2013  .  Calculus of ureter 06/04/2013  . GERD (gastroesophageal reflux disease) 08/06/2012  . Allergic rhinitis, cause unspecified 07/04/2012  . Low back pain radiating to both legs 07/04/2012  . Recurrent UTI 09/28/2010  . GOITER, MULTINODULAR 11/04/2008  . Thyrotoxicosis 11/04/2008  . Diabetes mellitus with peripheral vascular disease 08/05/2008  . Hyperlipidemia with target LDL less than 100 08/05/2008  . Essential hypertension, benign 08/05/2008   Past Medical History  Diagnosis Date  . Hyperlipidemia   . Hypertension   . PONV (postoperative nausea and vomiting)   . Right ureteral stone   . PAD (peripheral artery disease)   . Atherosclerosis of aorta   . GERD (gastroesophageal reflux disease)   . Asymptomatic cholelithiasis   . Peripheral arterial occlusive disease     lower extremities  . Wears glasses   . Hypothyroidism     no meds  . Kidney stones   . Type 2 diabetes mellitus   . Anemia        Medication List    TAKE these medications        canagliflozin 300 MG Tabs tablet  Commonly known as:  INVOKANA  Take 1 tablet (300 mg total) by mouth every morning.     clopidogrel 75 MG tablet  Commonly known as:  PLAVIX  Take 1 tablet (75 mg total) by mouth  daily.     ezetimibe 10 MG tablet  Commonly known as:  ZETIA  Take 1 tablet (10 mg total) by mouth daily.     gabapentin 300 MG capsule  Commonly known as:  NEURONTIN  Take 1 capsule (300 mg total) by mouth 3 (three) times daily.     glyBURIDE 5 MG tablet  Commonly known as:  DIABETA  TAKE ONE TABLET BY MOUTH ONCE DAILY     Insulin Detemir 100 UNIT/ML Pen  Commonly known as:  LEVEMIR FLEXTOUCH  Inject 30 Units into the skin daily at 10 pm. DX 250.72     Insulin Pen Needle 32G X 4 MM Misc  Commonly known as:  NOVOFINE PLUS  - Use to inject once a day  - Dx 250.00     Iron 325 (65 FE) MG Tabs  Take 1 tablet by mouth daily.     levofloxacin 500 MG tablet  Commonly known as:  LEVAQUIN  Take 1 tablet (500 mg total) by mouth daily.     losartan-hydrochlorothiazide 100-12.5 MG per tablet  Commonly known as:  HYZAAR  Take 1 tablet by mouth daily.     Magnesium Oxide 400 MG Caps  Take 1 capsule (400 mg total) by mouth daily.     metFORMIN 1000 MG tablet  Commonly known as:  GLUCOPHAGE  Take 1,000 mg by mouth 2 (two) times daily.     oxyCODONE 5 MG immediate release tablet  Commonly known as:  Oxy IR/ROXICODONE  Take 1-2 tablets (5-10 mg total) by mouth every 6 (six) hours as needed for severe pain.     pantoprazole 40 MG tablet  Commonly known as:  PROTONIX  Take 40 mg by mouth every morning.     Pitavastatin Calcium 2 MG Tabs  Commonly known as:  LIVALO  Take 1 tablet (2 mg total) by mouth daily.        Roxicodone #30 No Refill  Disposition: Home with home health PT  Patient's condition: is Good  Follow up: 1. Dr. Trula Slade in 2 weeks   Virgina Jock, PA-C [facility] [phone] 06/14/2014  10:16 AM  -  For [facility] Registry use --- Instructions: Press F2 to tab through selections.  Delete question if not applicable.   Post-op:  Wound infection: No  Graft infection: No  Transfusion: No   New Arrhythmia: No Ipsilateral amputation:  No, [ ]  Minor, [ ]  BKA, [ ]  AKA Discharge patency: [ ]  Primary, [x ] Primary assisted, [ ]  Secondary, [ ]  Occluded Patency judged by: [x ] Dopper only, [ ]  Palpable graft pulse, [ ]  Palpable distal pulse, [ ]  ABI inc. > 0.15, [ ]  Duplex Discharge ABI: R 0.54, L 0.9 D/C Ambulatory Status: Ambulatory with Assistance  Complications: MI: No, [ ]  Troponin only, [ ]  EKG or Clinical CHF: No Resp failure:No, [ ]  Pneumonia, [ ]  Ventilator Chg in renal function: No, [ ]  Inc. Cr > 0.5, [ ]  Temp. Dialysis, [ ]  Permanent dialysis Stroke: No, [ ]  Minor, [ ]  Major Return to OR: Yes  Reason for return to OR: [ ]  Bleeding, [ ]  Infection, [x]  Thrombosis, []  Revision   Discharge medications: Statin use:  yes ASA use:  no Plavix use:  yes Beta blocker use: no ARB use: yes Coumadin use: no

## 2014-06-14 NOTE — ED Notes (Signed)
Patient states she feels weak, but denies any other symptoms. Patient remains on the cardiac monitor. Waiting to be seen by MD. Patient alert and oriented x4.

## 2014-06-14 NOTE — ED Notes (Signed)
CBG 76. PA made aware.

## 2014-06-14 NOTE — ED Notes (Signed)
CBG 67 

## 2014-06-14 NOTE — ED Notes (Signed)
Per EMS, Patient was released from Bypass surgery yesterday. Patient has a history of insulin dependant Diabetes. Patient woke up this morning and after taking medication CBG was 45. Patient has continually been monitoring blood sugar and it has not gotten higher than 70. Patient called EMS after she was not feeling well and blood sugar was reported at 55 when EMS arrived. EMS gave 1/2 D50 and patient's sugar rose to 150. Upon arrival to the ED, patient's CBG 89. Vitals per EMS: 130/74, 66 HR, 14 RR, 98 % on RA.

## 2014-06-15 ENCOUNTER — Telehealth: Payer: Self-pay | Admitting: Surgery

## 2014-06-15 DIAGNOSIS — I10 Essential (primary) hypertension: Secondary | ICD-10-CM

## 2014-06-15 DIAGNOSIS — D508 Other iron deficiency anemias: Secondary | ICD-10-CM

## 2014-06-15 DIAGNOSIS — E785 Hyperlipidemia, unspecified: Secondary | ICD-10-CM

## 2014-06-15 DIAGNOSIS — I739 Peripheral vascular disease, unspecified: Secondary | ICD-10-CM

## 2014-06-15 DIAGNOSIS — I998 Other disorder of circulatory system: Secondary | ICD-10-CM | POA: Diagnosis present

## 2014-06-15 DIAGNOSIS — E876 Hypokalemia: Secondary | ICD-10-CM | POA: Diagnosis present

## 2014-06-15 DIAGNOSIS — E079 Disorder of thyroid, unspecified: Secondary | ICD-10-CM | POA: Diagnosis present

## 2014-06-15 DIAGNOSIS — E1151 Type 2 diabetes mellitus with diabetic peripheral angiopathy without gangrene: Secondary | ICD-10-CM

## 2014-06-15 DIAGNOSIS — E162 Hypoglycemia, unspecified: Secondary | ICD-10-CM | POA: Diagnosis present

## 2014-06-15 LAB — GLUCOSE, CAPILLARY
GLUCOSE-CAPILLARY: 111 mg/dL — AB (ref 70–99)
GLUCOSE-CAPILLARY: 125 mg/dL — AB (ref 70–99)
GLUCOSE-CAPILLARY: 151 mg/dL — AB (ref 70–99)
GLUCOSE-CAPILLARY: 156 mg/dL — AB (ref 70–99)
Glucose-Capillary: 149 mg/dL — ABNORMAL HIGH (ref 70–99)
Glucose-Capillary: 132 mg/dL — ABNORMAL HIGH (ref 70–99)
Glucose-Capillary: 146 mg/dL — ABNORMAL HIGH (ref 70–99)
Glucose-Capillary: 151 mg/dL — ABNORMAL HIGH (ref 70–99)
Glucose-Capillary: 150 mg/dL — ABNORMAL HIGH (ref 70–99)
Glucose-Capillary: 131 mg/dL — ABNORMAL HIGH (ref 70–99)
Glucose-Capillary: 154 mg/dL — ABNORMAL HIGH (ref 70–99)
Glucose-Capillary: 200 mg/dL — ABNORMAL HIGH (ref 70–99)

## 2014-06-15 LAB — BASIC METABOLIC PANEL
ANION GAP: 12 (ref 5–15)
BUN: 6 mg/dL (ref 6–23)
CO2: 23 mmol/L (ref 19–32)
Calcium: 8.5 mg/dL (ref 8.4–10.5)
Chloride: 106 mmol/L (ref 96–112)
Creatinine, Ser: 0.83 mg/dL (ref 0.50–1.10)
GFR calc Af Amer: 89 mL/min — ABNORMAL LOW (ref >90)
GFR calc non Af Amer: 77 mL/min — ABNORMAL LOW (ref >90)
Glucose, Bld: 149 mg/dL — ABNORMAL HIGH (ref 70–99)
Potassium: 3.5 mmol/L (ref 3.5–5.1)
SODIUM: 141 mmol/L (ref 135–145)

## 2014-06-15 LAB — CBC
HCT: 29.7 % — ABNORMAL LOW (ref 36.0–46.0)
Hemoglobin: 9.7 g/dL — ABNORMAL LOW (ref 12.0–15.0)
MCH: 28.6 pg (ref 26.0–34.0)
MCHC: 32.7 g/dL (ref 30.0–36.0)
MCV: 87.6 fL (ref 78.0–100.0)
Platelets: 343 10*3/uL (ref 150–400)
RBC: 3.39 MIL/uL — ABNORMAL LOW (ref 3.87–5.11)
RDW: 14 % (ref 11.5–15.5)
WBC: 9.5 10*3/uL (ref 4.0–10.5)

## 2014-06-15 LAB — TSH: TSH: 0.596 u[IU]/mL (ref 0.350–4.500)

## 2014-06-15 MED ORDER — HYDROMORPHONE HCL 1 MG/ML IJ SOLN: 0.5000 mg | INTRAMUSCULAR | Status: DC | PRN

## 2014-06-15 MED ORDER — LOSARTAN POTASSIUM-HCTZ 100-12.5 MG PO TABS: 1.0000 | ORAL_TABLET | Freq: Every day | ORAL | Status: DC

## 2014-06-15 MED ORDER — GABAPENTIN 300 MG PO CAPS
300.0000 mg | ORAL_CAPSULE | Freq: Three times a day (TID) | ORAL | Status: DC
  Administered 2014-06-15 – 2014-06-16 (×5): 300 mg via ORAL
  Filled 2014-06-15 (×7): qty 1

## 2014-06-15 MED ORDER — CLOPIDOGREL BISULFATE 75 MG PO TABS
75.0000 mg | ORAL_TABLET | Freq: Every day | ORAL | Status: DC
  Administered 2014-06-15 – 2014-06-16 (×2): 75 mg via ORAL
  Filled 2014-06-15 (×2): qty 1

## 2014-06-15 MED ORDER — ONDANSETRON HCL 4 MG/2ML IJ SOLN: 4.0000 mg | Freq: Four times a day (QID) | INTRAMUSCULAR | Status: DC | PRN

## 2014-06-15 MED ORDER — LEVOFLOXACIN 500 MG PO TABS
500.0000 mg | ORAL_TABLET | Freq: Every day | ORAL | Status: DC
  Administered 2014-06-15 – 2014-06-16 (×2): 500 mg via ORAL
  Filled 2014-06-15 (×2): qty 1

## 2014-06-15 MED ORDER — LOSARTAN POTASSIUM 50 MG PO TABS
100.0000 mg | ORAL_TABLET | Freq: Every day | ORAL | Status: DC
  Administered 2014-06-15 – 2014-06-16 (×2): 100 mg via ORAL
  Filled 2014-06-15 (×2): qty 2

## 2014-06-15 MED ORDER — FERROUS SULFATE 325 (65 FE) MG PO TABS
325.0000 mg | ORAL_TABLET | Freq: Every day | ORAL | Status: DC
Start: 2014-06-15 — End: 2014-06-16
  Administered 2014-06-15 – 2014-06-16 (×2): 325 mg via ORAL
  Filled 2014-06-15 (×2): qty 1

## 2014-06-15 MED ORDER — MAGNESIUM OXIDE 400 (241.3 MG) MG PO TABS
400.0000 mg | ORAL_TABLET | Freq: Every day | ORAL | Status: DC
  Administered 2014-06-15 – 2014-06-16 (×2): 400 mg via ORAL
  Filled 2014-06-15 (×2): qty 1

## 2014-06-15 MED ORDER — PANTOPRAZOLE SODIUM 40 MG PO TBEC
40.0000 mg | DELAYED_RELEASE_TABLET | Freq: Every day | ORAL | Status: DC
  Administered 2014-06-15 – 2014-06-16 (×2): 40 mg via ORAL
  Filled 2014-06-15 (×2): qty 1

## 2014-06-15 MED ORDER — INSULIN ASPART 100 UNIT/ML ~~LOC~~ SOLN
0.0000 [IU] | SUBCUTANEOUS | Status: DC
  Administered 2014-06-15: 1 [IU] via SUBCUTANEOUS
  Administered 2014-06-15: 2 [IU] via SUBCUTANEOUS
  Administered 2014-06-15: 1 [IU] via SUBCUTANEOUS
  Administered 2014-06-15 (×3): 2 [IU] via SUBCUTANEOUS
  Administered 2014-06-16: 5 [IU] via SUBCUTANEOUS
  Administered 2014-06-16 (×2): 2 [IU] via SUBCUTANEOUS

## 2014-06-15 MED ORDER — ACETAMINOPHEN 650 MG RE SUPP: 650.0000 mg | Freq: Four times a day (QID) | RECTAL | Status: DC | PRN

## 2014-06-15 MED ORDER — HYDROCHLOROTHIAZIDE 12.5 MG PO CAPS
12.5000 mg | ORAL_CAPSULE | Freq: Every day | ORAL | Status: DC
  Administered 2014-06-15 – 2014-06-16 (×2): 12.5 mg via ORAL
  Filled 2014-06-15 (×2): qty 1

## 2014-06-15 MED ORDER — ASPIRIN EC 325 MG PO TBEC
325.0000 mg | DELAYED_RELEASE_TABLET | Freq: Every day | ORAL | Status: DC
  Administered 2014-06-15 – 2014-06-16 (×2): 325 mg via ORAL
  Filled 2014-06-15 (×2): qty 1

## 2014-06-15 MED ORDER — ENOXAPARIN SODIUM 30 MG/0.3ML ~~LOC~~ SOLN
30.0000 mg | SUBCUTANEOUS | Status: DC
  Administered 2014-06-15: 30 mg via SUBCUTANEOUS
  Filled 2014-06-15 (×2): qty 0.3

## 2014-06-15 MED ORDER — OXYCODONE HCL 5 MG PO TABS: 5.0000 mg | ORAL_TABLET | ORAL | Status: DC | PRN

## 2014-06-15 MED ORDER — ACETAMINOPHEN 325 MG PO TABS: 650.0000 mg | ORAL_TABLET | Freq: Four times a day (QID) | ORAL | Status: DC | PRN

## 2014-06-15 MED ORDER — ALUM & MAG HYDROXIDE-SIMETH 200-200-20 MG/5ML PO SUSP: 30.0000 mL | Freq: Four times a day (QID) | ORAL | Status: DC | PRN

## 2014-06-15 MED ORDER — DEXTROSE-NACL 5-0.45 % IV SOLN
INTRAVENOUS | Status: DC
  Administered 2014-06-15: 02:00:00 via INTRAVENOUS

## 2014-06-15 MED ORDER — ONDANSETRON HCL 4 MG PO TABS: 4.0000 mg | ORAL_TABLET | Freq: Four times a day (QID) | ORAL | Status: DC | PRN

## 2014-06-15 MED ORDER — PRAVASTATIN SODIUM 40 MG PO TABS
40.0000 mg | ORAL_TABLET | Freq: Every day | ORAL | Status: DC
  Administered 2014-06-15: 40 mg via ORAL
  Filled 2014-06-15 (×2): qty 1

## 2014-06-15 MED ORDER — EZETIMIBE 10 MG PO TABS
10.0000 mg | ORAL_TABLET | Freq: Every day | ORAL | Status: DC
  Administered 2014-06-15 (×2): 10 mg via ORAL
  Filled 2014-06-15 (×3): qty 1

## 2014-06-15 NOTE — Progress Notes (Signed)
UR completed 

## 2014-06-15 NOTE — Progress Notes (Signed)
New Admission Note:  Arrival Method:via stretcher with RN  Mental Orientation: A&OX4 Telemetry:N/A Assessment: Completed Skin: Left groin and left leg surgical incision, 2nd left toe necrotic IV: Left AC, d 5 1/2 NS infusing Pain: Denies any pain Tubes:n/a Safety Measures: Safety Fall Prevention Plan was given, discussed and signed. Admission: Completed [facility] [address] Orientation: Patient has been orientated to the room, unit and the staff. Family: None at bedside  Orders have been reviewed and implemented. Will continue to monitor the patient. Call light has been placed within reach and bed alarm has been activated.   Leandro Reasoner BSN, RN  Phone Number: [phone] [facility]

## 2014-06-15 NOTE — ED Notes (Signed)
Dr. Lynford Humphrey at the bedside with pt.

## 2014-06-15 NOTE — Telephone Encounter (Signed)
-----   Message from Mena Goes, RN sent at 06/14/2014  9:14 PM EST ----- Regarding: Schedule   ----- Message -----    From: Alvia Grove, PA-C    Sent: 06/13/2014   9:31 AM      To: [facility] Charge Pool  S/p L EIA and CFA with PA, L CFA to BK pop BPG w/ NR GSV 06/08/14 L AT EA, TE L pop, L CFA to BK pop BPG with Propaten 06/09/14  F/u with Dr. Trula Slade in 2 weeks.

## 2014-06-15 NOTE — ED Provider Notes (Signed)
CSN: [id number]     Arrival date & time 06/14/14  2120 History   First MD Initiated Contact with Patient 06/14/14 2128     Chief Complaint  Patient presents with  . Hypoglycemia     (Consider location/radiation/quality/duration/timing/severity/associated sxs/prior Treatment) HPI Patient presents to the emergency department with hypoglycemia that started this morning.  The patient states that his morning she noted that she was hypoglycemic and she attempted to raise her blood sugar throughout the day, but was unsuccessful.  The patient states she fall meals and her blood sugar never got above 80.  The patient states that nothing seems make her condition better or worse.  Patient states she does not have any chest pain, shortness of breath, nausea, vomiting, weakness, dizziness, headache, blurred vision, fever , cough, runny nose, sore throat or syncope.  The patient states that she has been taking her medications as prescribed Past Medical History  Diagnosis Date  . Hyperlipidemia   . Hypertension   . PONV (postoperative nausea and vomiting)   . Right ureteral stone   . PAD (peripheral artery disease)   . Atherosclerosis of aorta   . GERD (gastroesophageal reflux disease)   . Asymptomatic cholelithiasis   . Peripheral arterial occlusive disease     lower extremities  . Wears glasses   . Hypothyroidism     no meds  . Kidney stones   . Type 2 diabetes mellitus   . Anemia    Past Surgical History  Procedure Laterality Date  . Belpharoptosis repair      eyelid lift  . Cystoscopy with stent placement Right 06/04/2013    Procedure: CYSTOSCOPY WITH STENT PLACEMENT;  Surgeon: Franchot Gallo, MD;  Location: [facility];  Service: Urology;  Laterality: Right;  . Right knee patellectomy w/ repair of extensor mechanism  04-14-2002  . Orif fifth metacarpal fx  right hand  04-21-2002  . Laparoscopic gastric banding  05-29-2005  . Revision and re-siting lap-band port  04-08-2010    W/  UPPER EGD   . Combined augmentation mammaplasty and abdominoplasty  2009    W/  BILATERAL  THIGH LIFT  . Cysto/  right ureteral stent placement  12-30-2010  . Extracorporeal shock wave lithotripsy Right 08-04-2013//   06-23-2013//   01-16-2011  . Cystoscopy with retrograde pyelogram, ureteroscopy and stent placement Right 10/07/2013    Procedure: CYSTOSCOPY WITH RETROGRADE PYELOGRAM, right URETEROSCOPY AND STENT PLACEMENT, stone extraction;  Surgeon: Arvil Persons, MD;  Location: [facility];  Service: Urology;  Laterality: Right;  . Holmium laser application Right 09/05/8467    Procedure: HOLMIUM LASER APPLICATION;  Surgeon: Arvil Persons, MD;  Location: [facility];  Service: Urology;  Laterality: Right;  . Kidney stone surgery  April 2015    1-2 stones  . Toenail removed Left Jan. 21, 2016    2nd toenail-  Dr. Barkley Bruns  . Augmentation mammaplasty    . Lithotripsy  2-3 times  . Lower extremity angiogram N/A 06/04/2014    Procedure: LOWER EXTREMITY ANGIOGRAM;  Surgeon: Serafina Mitchell, MD;  Location: [facility] C[facility];  Service: Cardiovascular;  Laterality: N/A;  . Endarterectomy femoral Left 06/08/2014    Procedure: Left Leg Common Femoral and External Iliac  Endartarectomy with patch Angioplasty;  Surgeon: Rosetta Posner, MD;  Location: [place];  Service: Vascular;  Laterality: Left;  . Femoral-popliteal bypass graft Left 06/08/2014    Procedure: Left Leg Femoral -Popliteal Bypass Graft;  Surgeon: Rosetta Posner, MD;  Location: [facility] OR;  Service: Vascular;  Laterality: Left;  . Femoral-popliteal bypass graft Left 06/09/2014    Procedure: Left Femoral and Popliteal Exposure; Left Femoral to Anterior Tibial Bypass Graft using Propaten 3mm by 80cm Goretex Graft; Left Tibial Endarterectomy; Left Femoraland Popliteal Thrombectomy ;  Surgeon: Serafina Mitchell, MD;  Location: [facility] OR;  Service: Vascular;  Laterality: Left;   Family History  Problem Relation Age of Onset  . Arthritis Other   . Cancer Other      colon  . Hypertension Other   . Stroke Other   . Diabetes Father   . Heart disease Father   . Deep vein thrombosis Father   . Hyperlipidemia Father   . Diabetes Sister   . Heart disease Sister   . Deep vein thrombosis Sister   . Hyperlipidemia Sister   . Diabetes Brother   . Heart disease Brother   . Cancer Brother   . Hyperlipidemia Brother   . Colon cancer Maternal Aunt   . Diabetes Brother   . Diabetes Brother   . Kidney disease Mother   . Hyperlipidemia Mother   . Other Mother     AAA   and    Amputation   History  Substance Use Topics  . Smoking status: Former Smoker -- 1.00 packs/day for 28 years    Types: Cigarettes    Quit date: 09/05/2001  . Smokeless tobacco: Never Used  . Alcohol Use: No   OB History    No data available     Review of Systems  All other systems negative except as documented in the HPI. All pertinent positives and negatives as reviewed in the HPI.  Allergies  Amlodipine; Atorvastatin; Ampicillin; Codeine; Lisinopril; Penicillins; and Januvia  Home Medications   Prior to Admission medications   Medication Sig Start Date End Date Taking? Authorizing Provider  aspirin EC 325 MG tablet Take 325 mg by mouth daily.   Yes Historical Provider, MD  Canagliflozin (INVOKANA) 300 MG TABS Take 1 tablet (300 mg total) by mouth every morning. 10/28/13  Yes [name], MD  clopidogrel (PLAVIX) 75 MG tablet Take 1 tablet (75 mg total) by mouth daily. 06/13/14  Yes Alvia Grove, PA-C  ezetimibe (ZETIA) 10 MG tablet Take 1 tablet (10 mg total) by mouth daily. Patient taking differently: Take 10 mg by mouth at bedtime.  02/05/14  Yes [name], MD  Ferrous Sulfate (IRON) 325 (65 FE) MG TABS Take 325 mg by mouth daily.    Yes Historical Provider, MD  gabapentin (NEURONTIN) 300 MG capsule Take 1 capsule (300 mg total) by mouth 3 (three) times daily. Patient taking differently: Take 300 mg by mouth 3 (three) times daily. Taking different. Take twice  daily 04/28/14  Yes [name], MD  glyBURIDE (DIABETA) 5 MG tablet TAKE ONE TABLET BY MOUTH ONCE DAILY 12/23/13  Yes [name], MD  Insulin Detemir (LEVEMIR FLEXTOUCH) 100 UNIT/ML Pen Inject 30 Units into the skin daily at 10 pm. DX 250.72 12/01/13  Yes [name], MD  levofloxacin (LEVAQUIN) 500 MG tablet Take 1 tablet (500 mg total) by mouth daily. Patient taking differently: Take 500 mg by mouth daily. 15 day course started 06/14/14 06/13/14  Yes Alvia Grove, PA-C  losartan-hydrochlorothiazide (HYZAAR) 100-12.5 MG per tablet Take 1 tablet by mouth daily. 11/26/13  Yes [name], MD  Magnesium Oxide 400 MG CAPS Take 1 capsule (400 mg total) by mouth daily. 04/02/14  Yes [name], MD  metFORMIN (GLUCOPHAGE) 1000 MG tablet Take 1,000 mg by mouth 2 (two) times daily.   Yes Historical Provider, MD  pantoprazole (PROTONIX) 40 MG tablet Take 40 mg by mouth daily.    Yes Historical Provider, MD  Pitavastatin Calcium (LIVALO) 2 MG TABS Take 1 tablet (2 mg total) by mouth daily. Patient taking differently: Take 2 mg by mouth at bedtime.  11/26/13  Yes [name], MD  Insulin Pen Needle (NOVOFINE PLUS) 32G X 4 MM MISC Use to inject once a day Dx 250.00 01/21/14   [name], MD  oxyCODONE (OXY IR/ROXICODONE) 5 MG immediate release tablet Take 1-2 tablets (5-10 mg total) by mouth every 6 (six) hours as needed for severe pain. Patient not taking: Reported on 06/14/2014 06/13/14   Joelene Millin A Trinh, PA-C   BP 105/51 mmHg  Pulse 61  Temp(Src) 98.1 F (36.7 C) (Oral)  Resp 21  SpO2 98% Physical Exam  Constitutional: She is oriented to person, place, and time. She appears well-developed and well-nourished. No distress.  HENT:  Head: Normocephalic and atraumatic.  Mouth/Throat: Oropharynx is clear and moist.  Eyes: Pupils are equal, round, and reactive to light.  Neck: Normal range of motion. Neck supple.  Cardiovascular: Normal rate, regular rhythm and normal heart sounds.   Exam reveals no gallop and no friction rub.   No murmur heard. Pulmonary/Chest: Effort normal and breath sounds normal. No respiratory distress.  Abdominal: Soft. Bowel sounds are normal. She exhibits no distension. There is no tenderness.  Musculoskeletal: She exhibits no edema.  Neurological: She is alert and oriented to person, place, and time. She exhibits normal muscle tone. Coordination normal.  Skin: Skin is warm and dry. No rash noted. No erythema.  Nursing note and vitals reviewed.   ED Course  Procedures (including critical care time) Labs Review Labs Reviewed  URINALYSIS, ROUTINE W REFLEX MICROSCOPIC - Abnormal; Notable for the following:    Glucose, UA >1000 (*)    Hgb urine dipstick MODERATE (*)    Leukocytes, UA MODERATE (*)    All other components within normal limits  COMPREHENSIVE METABOLIC PANEL - Abnormal; Notable for the following:    Potassium 3.0 (*)    Albumin 2.4 (*)    Alkaline Phosphatase 31 (*)    GFR calc non Af Amer 64 (*)    GFR calc Af Amer 75 (*)    All other components within normal limits  CBC WITH DIFFERENTIAL/PLATELET - Abnormal; Notable for the following:    RBC 3.18 (*)    Hemoglobin 9.2 (*)    HCT 28.2 (*)    All other components within normal limits  URINE MICROSCOPIC-ADD ON - Abnormal; Notable for the following:    Squamous Epithelial / LPF FEW (*)    Bacteria, UA FEW (*)    All other components within normal limits    I spoke with the [facility] who will admit the patient for further evaluation and care  MDM   Final diagnoses:  None       Brent General, PA-C 06/15/14 0029  Wandra Arthurs, MD 06/17/14 1311

## 2014-06-15 NOTE — H&P (Addendum)
[facility] Admission History and Physical       Tayah Idrovo TMH:[id number] DOB: [birth year] DOA: 06/14/2014  Referring physician: EDP  PCP: Scarlette Calico, MD  Specialists:   Chief Complaint: Low Blood Sugar  HPI: Theresa Harrington is a 59 y.o. female with a history of DM2, PAD with recent Left Fem Pop Graft on 06/09/2014, HTN, Hyperlipidemia, Hypothyroid who presents to the ED with  Complaints of low glucose levels since the AM.  She reports her glucose level went down to 45 and she was having trouble keeping her glucose level up.    She denies any fevers or chills or nausea or vomiting.  She is on Levemir Insulin, Metformin Glyburide, and Invokana  for her Diabetes.  She denies any loss of Appetite and has been eating her meals.  She is also on Levaquin rx for a nonhealing ulcer of her left 2nd toe.  Her Left Fem-Pop Surgery was on 06/09/2014 and she was discharged from the hospital 1 days ago.       Review of Systems:  Constitutional: No Weight Loss, No Weight Gain, Night Sweats, Fevers, Chills, Dizziness, Light Headedness, Fatigue, or Generalized Weakness HEENT: No Headaches, Difficulty Swallowing,Tooth/Dental Problems,Sore Throat,  No Sneezing, Rhinitis, Ear Ache, Nasal Congestion, or Post Nasal Drip,  Cardio-vascular:  No Chest pain, Orthopnea, PND, Edema in Lower Extremities, Anasarca, Dizziness, Palpitations  Resp: No Dyspnea, No DOE, No Productive Cough, No Non-Productive Cough, No Hemoptysis, No Wheezing.    GI: No Heartburn, Indigestion, Abdominal Pain, Nausea, Vomiting, Diarrhea, Constipation, Hematemesis, Hematochezia, Melena, Change in Bowel Habits,  Loss of Appetite  GU: No Dysuria, No Change in Color of Urine, No Urgency or Urinary Frequency, No Flank pain.  Musculoskeletal: No Joint Pain or Swelling, No Decreased Range of Motion, No Back Pain.  Neurologic: No Syncope, No Seizures, Muscle Weakness, Paresthesia, Vision Disturbance or Loss, No Diplopia, No Vertigo, No Difficulty  Walking,  Skin: No Rash or Lesions. Psych: No Change in Mood or Affect, No Depression or Anxiety, No Memory loss, No Confusion, or Hallucinations  Past Medical History  Diagnosis Date  . Hyperlipidemia   . Hypertension   . PONV (postoperative nausea and vomiting)   . Right ureteral stone   . PAD (peripheral artery disease)   . Atherosclerosis of aorta   . GERD (gastroesophageal reflux disease)   . Asymptomatic cholelithiasis   . Peripheral arterial occlusive disease     lower extremities  . Wears glasses   . Hypothyroidism     no meds  . Kidney stones   . Type 2 diabetes mellitus   . Anemia      Past Surgical History  Procedure Laterality Date  . Belpharoptosis repair      eyelid lift  . Cystoscopy with stent placement Right 06/04/2013    Procedure: CYSTOSCOPY WITH STENT PLACEMENT;  Surgeon: Franchot Gallo, MD;  Location: [facility];  Service: Urology;  Laterality: Right;  . Right knee patellectomy w/ repair of extensor mechanism  04-14-2002  . Orif fifth metacarpal fx  right hand  04-21-2002  . Laparoscopic gastric banding  05-29-2005  . Revision and re-siting lap-band port  04-08-2010    W/  UPPER EGD  . Combined augmentation mammaplasty and abdominoplasty  2009    W/  BILATERAL  THIGH LIFT  . Cysto/  right ureteral stent placement  12-30-2010  . Extracorporeal shock wave lithotripsy Right 08-04-2013//   06-23-2013//   01-16-2011  . Cystoscopy with retrograde pyelogram, ureteroscopy and stent placement Right  10/07/2013    Procedure: CYSTOSCOPY WITH RETROGRADE PYELOGRAM, right URETEROSCOPY AND STENT PLACEMENT, stone extraction;  Surgeon: Arvil Persons, MD;  Location: [facility];  Service: Urology;  Laterality: Right;  . Holmium laser application Right 06/04/5359    Procedure: HOLMIUM LASER APPLICATION;  Surgeon: Arvil Persons, MD;  Location: [facility];  Service: Urology;  Laterality: Right;  . Kidney stone surgery  April 2015    1-2 stones  .  Toenail removed Left Jan. 21, 2016    2nd toenail-  Dr. Barkley Bruns  . Augmentation mammaplasty    . Lithotripsy  2-3 times  . Lower extremity angiogram N/A 06/04/2014    Procedure: LOWER EXTREMITY ANGIOGRAM;  Surgeon: Serafina Mitchell, MD;  Location: [facility] CATH LAB;  Service: Cardiovascular;  Laterality: N/A;  . Endarterectomy femoral Left 06/08/2014    Procedure: Left Leg Common Femoral and External Iliac  Endartarectomy with patch Angioplasty;  Surgeon: Rosetta Posner, MD;  Location: [place];  Service: Vascular;  Laterality: Left;  . Femoral-popliteal bypass graft Left 06/08/2014    Procedure: Left Leg Femoral -Popliteal Bypass Graft;  Surgeon: Rosetta Posner, MD;  Location: [place];  Service: Vascular;  Laterality: Left;  . Femoral-popliteal bypass graft Left 06/09/2014    Procedure: Left Femoral and Popliteal Exposure; Left Femoral to Anterior Tibial Bypass Graft using Propaten 52mm by 80cm Goretex Graft; Left Tibial Endarterectomy; Left Femoraland Popliteal Thrombectomy ;  Surgeon: Serafina Mitchell, MD;  Location: [facility] OR;  Service: Vascular;  Laterality: Left;      Prior to Admission medications   Medication Sig Start Date End Date Taking? Authorizing Provider  aspirin EC 325 MG tablet Take 325 mg by mouth daily.   Yes Historical Provider, MD  Canagliflozin (INVOKANA) 300 MG TABS Take 1 tablet (300 mg total) by mouth every morning. 10/28/13  Yes [name], MD  clopidogrel (PLAVIX) 75 MG tablet Take 1 tablet (75 mg total) by mouth daily. 06/13/14  Yes Alvia Grove, PA-C  ezetimibe (ZETIA) 10 MG tablet Take 1 tablet (10 mg total) by mouth daily. Patient taking differently: Take 10 mg by mouth at bedtime.  02/05/14  Yes [name], MD  Ferrous Sulfate (IRON) 325 (65 FE) MG TABS Take 325 mg by mouth daily.    Yes Historical Provider, MD  gabapentin (NEURONTIN) 300 MG capsule Take 1 capsule (300 mg total) by mouth 3 (three) times daily. Patient taking differently: Take 300 mg by mouth 3 (three) times daily.  Taking different. Take twice daily 04/28/14  Yes [name], MD  glyBURIDE (DIABETA) 5 MG tablet TAKE ONE TABLET BY MOUTH ONCE DAILY 12/23/13  Yes [name], MD  Insulin Detemir (LEVEMIR FLEXTOUCH) 100 UNIT/ML Pen Inject 30 Units into the skin daily at 10 pm. DX 250.72 12/01/13  Yes [name], MD  levofloxacin (LEVAQUIN) 500 MG tablet Take 1 tablet (500 mg total) by mouth daily. Patient taking differently: Take 500 mg by mouth daily. 15 day course started 06/14/14 06/13/14  Yes Alvia Grove, PA-C  losartan-hydrochlorothiazide (HYZAAR) 100-12.5 MG per tablet Take 1 tablet by mouth daily. 11/26/13  Yes [name], MD  Magnesium Oxide 400 MG CAPS Take 1 capsule (400 mg total) by mouth daily. 04/02/14  Yes [name], MD  metFORMIN (GLUCOPHAGE) 1000 MG tablet Take 1,000 mg by mouth 2 (two) times daily.   Yes Historical Provider, MD  pantoprazole (PROTONIX) 40 MG tablet Take 40 mg  by mouth daily.    Yes Historical Provider, MD  Pitavastatin Calcium (LIVALO) 2 MG TABS Take 1 tablet (2 mg total) by mouth daily. Patient taking differently: Take 2 mg by mouth at bedtime.  11/26/13  Yes [name], MD  Insulin Pen Needle (NOVOFINE PLUS) 32G X 4 MM MISC Use to inject once a day Dx 250.00 01/21/14   [name], MD  oxyCODONE (OXY IR/ROXICODONE) 5 MG immediate release tablet Take 1-2 tablets (5-10 mg total) by mouth every 6 (six) hours as needed for severe pain. Patient not taking: Reported on 06/14/2014 06/13/14   Alvia Grove, PA-C     Allergies  Allergen Reactions  . Amlodipine Other (See Comments)    edema  . Atorvastatin Other (See Comments)    muscle aches  . Ampicillin Nausea And Vomiting  . Codeine Nausea And Vomiting  . Lisinopril Other (See Comments)     cough  . Penicillins Nausea And Vomiting  . Januvia [Sitagliptin]     nausea    Social History:  reports that she quit smoking about 12 years ago. Her smoking use included Cigarettes. She has a 28 pack-year  smoking history. She has never used smokeless tobacco. She reports that she does not drink alcohol or use illicit drugs.    Family History  Problem Relation Age of Onset  . Arthritis Other   . Cancer Other     colon  . Hypertension Other   . Stroke Other   . Diabetes Father   . Heart disease Father   . Deep vein thrombosis Father   . Hyperlipidemia Father   . Diabetes Sister   . Heart disease Sister   . Deep vein thrombosis Sister   . Hyperlipidemia Sister   . Diabetes Brother   . Heart disease Brother   . Cancer Brother   . Hyperlipidemia Brother   . Colon cancer Maternal Aunt   . Diabetes Brother   . Diabetes Brother   . Kidney disease Mother   . Hyperlipidemia Mother   . Other Mother     AAA   and    Amputation       Physical Exam:  GEN:  Pleasant Obese 59 y.o. African American female examined and in no acute distress; cooperative with exam Filed Vitals:   06/14/14 2200 06/14/14 2245 06/14/14 2315 06/15/14 0015  BP: 108/54 110/63 105/51 101/53  Pulse: 65 68 61 56  Temp:      TempSrc:      Resp: 20 23 21 19   SpO2: 97% 100% 98% 93%   Blood pressure 101/53, pulse 56, temperature 98.1 F (36.7 C), temperature source Oral, resp. rate 19, SpO2 93 %. PSYCH: She is alert and oriented x4; does not appear anxious does not appear depressed; affect is normal HEENT: Normocephalic and Atraumatic, Mucous membranes pink; PERRLA; EOM intact; Fundi:  Benign;  No scleral icterus, Nares: Patent, Oropharynx: Clear, Fair Dentition,    Neck:  FROM, No Cervical Lymphadenopathy nor Thyromegaly or Carotid Bruit; No JVD; Breasts:: Not examined CHEST WALL: No tenderness CHEST: Normal respiration, clear to auscultation bilaterally HEART: Regular rate and rhythm; no murmurs rubs or gallops BACK: No kyphosis or scoliosis; No CVA tenderness ABDOMEN: Positive Bowel Sounds,  Soft Non-Tender, No Rebound or Guarding; No Masses, No Organomegaly, . Rectal Exam: Not done EXTREMITIES: No Cyanosis,  Clubbing, or Edema; No Ulcerations. Genitalia: not examined PULSES: 2+ and symmetric SKIN: Normal hydration no rash or ulceration CNS:  Alert and  Oriented x 4, No Focal Deficits Vascular: pulses palpable throughout    Labs on Admission:  Basic Metabolic Panel:  Recent Labs Lab 06/08/14 0450 06/08/14 1905 06/09/14 0027 06/09/14 2024 06/11/14 0607 06/14/14 2204  NA  --   --  137  --  141 140  K  --   --  4.3  --  3.5 3.0*  CL  --   --  103  --  106 108  CO2  --   --  27  --  26 27  GLUCOSE  --   --  166* 143* 74 72  BUN  --   --  16  --  14 8  CREATININE 0.96 0.91 0.83  --  0.93 0.96  CALCIUM  --   --  8.9  --  8.7 9.0   Liver Function Tests:  Recent Labs Lab 06/14/14 2204  AST 19  ALT 13  ALKPHOS 31*  BILITOT 0.5  PROT 6.1  ALBUMIN 2.4*   No results for input(s): LIPASE, AMYLASE in the last 168 hours. No results for input(s): AMMONIA in the last 168 hours. CBC:  Recent Labs Lab 06/09/14 0027 06/11/14 0607 06/12/14 0423 06/13/14 0430 06/14/14 2204  WBC 13.0* 12.4* 9.9 10.5 10.4  NEUTROABS  --   --   --   --  6.8  HGB 10.6* 10.6* 9.7* 10.0* 9.2*  HCT 32.9* 33.0* 30.4* 31.9* 28.2*  MCV 90.9 90.7 90.5 90.9 88.7  PLT 282 300 288 301 329   Cardiac Enzymes: No results for input(s): CKTOTAL, CKMB, CKMBINDEX, TROPONINI in the last 168 hours.  BNP (last 3 results) No results for input(s): BNP in the last 8760 hours.  ProBNP (last 3 results) No results for input(s): PROBNP in the last 8760 hours.  CBG:  Recent Labs Lab 06/12/14 1123 06/12/14 1623 06/12/14 2137 06/13/14 0810 06/13/14 1121  GLUCAP 111* 92 118* 83 98    Radiological Exams on Admission: No results found.   EKG: Independently reviewed.    Assessment/Plan:   59 y.o. female with  Principal Problem:   1.   Hypoglycemia- due to Long acting Insulin and she may require lower doses  of her DM meds since she is post Fem POP and Infecftion is Improving.     Hold L:evemir  Insulin   Placed on D5 NSS at 100 cc hr   Monitor Glucose Level hourly   Active Problems:   2.   Diabetes mellitus with peripheral vascular disease   Hold DM MEds due to HYpoglycemia    Adjust and Resume once Glucose levels stabilize.        3.   Hyperlipidemia with target LDL less than 100   Continue Livilo and Zetia Rx     4.   Anemia   Send Anemia panel   Monitor Trend     5.   Essential hypertension, benign   Continue Losartan/HCTZ Rx   Monitor BPs     6.   PAD (peripheral artery disease)   Continue Plavix Rx and LIvilo and Zetia Rx     7.   Thyroid disease- Hypothyroid   Check TSH level       8.   Hypokalemia- due to  HCTZ component   Given KCL 40 meq PO x 1   Check Magnesium Level     9.   Ischemic toe   Wound CAre BID   10.   DVT Prophylaxis    Lovenox  Code Status:      FULL CODE Family Communication:  No Family Present    Disposition Plan:    Observation  MED Surg Unit   Time spent:  [facility] Pager [phone]   If [place] Please Contact the Day Rounding Team MD for [facility]  If 7PM-7AM, Please Contact Night-Floor Coverage  [email or web address] Password [email or web address] 06/15/2014, 12:51 AM     ADDENDUM:   Patient was seen and examined on 06/15/2014

## 2014-06-15 NOTE — ED Notes (Signed)
CBG 131 before leaving ED.

## 2014-06-15 NOTE — Progress Notes (Signed)
Patient ID: Theresa Harrington, female   DOB: 1955-10-26, 59 y.o.   MRN: [id number]  [facility] PROGRESS NOTE  Amyriah Buras YHC:[id number] DOB: Nov 10, 1955 DOA: 06/14/2014 PCP: Scarlette Calico, MD   Brief narrative:    59 y.o. female with a history of DM2, PAD with recent Left Fem Pop Graft on 06/09/2014 (discharged 06/14/2014), HTN, Hyperlipidemia, Hypothyroid who presented to [facility] ED with main concern of weakness and low sugar levels at home down to 40- 60's even with eating. She is on several different antihyperglycemic medications including Levemir, Invokana, Glyburide, Metformin and last A1C in 03/2014 was 7.9. She reports compliance with medical regimen. She is also taking Levaquin for non healing ulcer on the the 2nd left toe.   Assessment/Plan:    Principal Problem:   Hypoglycemic events - likely secondary to combination of antihyperglycemics pt was taking in the setting of still poor oral intake - currently all antihyperglycemic on hold and will continue to monitor CBG's - will consult diabetic educator for assistance - will likely not need all the meds she is on right now, would definitely stop glyburide and invocana - may be able to continue low dose insulin levemir with metformin  Active Problems:   Diabetes mellitus with peripheral vascular disease - monitor CBG's, no need for repeat A1C as the last one done 03/2014 - will readjust the home medical regimen based on inpatient glucose stability    Hyperlipidemia with target LDL less than 100 - continue statin    Left foot wound, erythema  - s/p arteriogram with bilateral runoff performed on 06/04/14 revealed bilateral common femoral artery high-grade lesions and superficial femoral occlusion, reconstitution of the below knee popliteal artery on the left with a stenosis at the distal tip with three-vessel runoff, reconstitution of the right below-knee popliteal artery - POD #5 s/p L AT EA, TE L pop, L CFA to BK pop BPG with Propaten - POD  #7 s/p L EIA and CFA with PA, L CFA to BK pop BPG w/ NR GSV - continue Levaquin as was recommended upon discharge by vascular surgery - will route this not to Dr. Trula Slade to make him aware of pt's admission     Anemia of chronic disease, DM, IDA - Hg remains stable 9 -10 - no signs of active bleeding - continue iron supplementation  - repeat CBC in AM   Hypokalemia - supplemented and WNL this AM   Essential hypertension, benign - reasonable inpatient control  - continue Losartan and HCTZ   PAD (peripheral artery disease) - continue aspirin and plavix    DVT prophylaxis - Lovenox SQ  Code Status: Full.  Family Communication:  plan of care discussed with the patient Disposition Plan: Home when CBG's stable, no hypoglycemic events for at last 24 hours on antihyperglycemic regimen that pt will be at home   IV access:  Peripheral IV  Procedures and diagnostic studies:    No results found.  Medical Consultants:  None  Other Consultants:  Diabetic educator   IAnti-Infectives:   Levaquin as per previous home regimen   Faye Ramsay, MD  [facility] Pager [phone]  If 7PM-7AM, please contact night-coverage [email or web address] Password [facility] 06/15/2014, 11:23 AM   LOS: 0 days   HPI/Subjective: No events overnight.   Objective: Filed Vitals:   06/15/14 0015 06/15/14 0127 06/15/14 0641 06/15/14 1000  BP: 101/53 120/92 102/58 108/61  Pulse: 56 63 59 57  Temp:  98.2 F (36.8 C) 98.6 F (37 C) 97.8 F (36.6 C)  TempSrc:  Oral Oral Oral  Resp: 19 18 18 18   Weight:  79.9 kg (176 lb 2.4 oz)    SpO2: 93% 99% 96% 96%    Intake/Output Summary (Last 24 hours) at 06/15/14 1123 Last data filed at 06/15/14 0900  Gross per 24 hour  Intake    240 ml  Output      0 ml  Net    240 ml    Exam:   General:  Pt is alert, follows commands appropriately, not in acute distress  Cardiovascular: Regular rate and rhythm, no rubs, no gallops  Respiratory: Clear to auscultation  bilaterally, no wheezing, no crackles, no rhonchi  Abdomen: Soft, non tender, non distended, bowel sounds present, no guarding  Extremities: pulses DP and PT palpable bilaterally  Neuro: Grossly nonfocal  Data Reviewed: Basic Metabolic Panel:  Recent Labs Lab 06/08/14 1905 06/09/14 0027 06/09/14 2024 06/11/14 0607 06/14/14 2204 06/15/14 0715  NA  --  137  --  141 140 141  K  --  4.3  --  3.5 3.0* 3.5  CL  --  103  --  106 108 106  CO2  --  27  --  26 27 23   GLUCOSE  --  166* 143* 74 72 149*  BUN  --  16  --  14 8 6   CREATININE 0.91 0.83  --  0.93 0.96 0.83  CALCIUM  --  8.9  --  8.7 9.0 8.5   Liver Function Tests:  Recent Labs Lab 06/14/14 2204  AST 19  ALT 13  ALKPHOS 31*  BILITOT 0.5  PROT 6.1  ALBUMIN 2.4*   CBC:  Recent Labs Lab 06/11/14 0607 06/12/14 0423 06/13/14 0430 06/14/14 2204 06/15/14 0715  WBC 12.4* 9.9 10.5 10.4 9.5  NEUTROABS  --   --   --  6.8  --   HGB 10.6* 9.7* 10.0* 9.2* 9.7*  HCT 33.0* 30.4* 31.9* 28.2* 29.7*  MCV 90.7 90.5 90.9 88.7 87.6  PLT 300 288 301 329 343    CBG:  Recent Labs Lab 06/15/14 0336 06/15/14 0427 06/15/14 0535 06/15/14 0640 06/15/14 0748  GLUCAP 146* 151* 150* 131* 151*    Recent Results (from the past 240 hour(s))  Surgical pcr screen     Status: None   Collection Time: 06/07/14  6:41 PM  Result Value Ref Range Status   MRSA, PCR NEGATIVE NEGATIVE Final   Staphylococcus aureus NEGATIVE NEGATIVE Final    Comment:        The Xpert SA Assay (FDA approved for NASAL specimens in patients over 60 years of age), is one component of a comprehensive surveillance program.  Test performance has been validated by [facility] for patients greater than or equal to 4 year old. It is not intended to diagnose infection nor to guide or monitor treatment.      Scheduled Meds: . aspirin EC  325 mg Oral Daily  . clopidogrel  75 mg Oral Daily  . enoxaparin   30 mg Subcutaneous Q24H  . ezetimibe  10 mg  Oral QHS  . ferrous sulfate  325 mg Oral Daily  . gabapentin  300 mg Oral TID  . losartan  100 mg Oral Daily  . hydrochlorothiazide  12.5 mg Oral Daily  . insulin aspart  0-9 Units Subcutaneous 6 times per day  . levofloxacin  500 mg Oral Daily  . magnesium oxide  400 mg Oral Daily  . pantoprazole  40 mg Oral Daily  .  pravastatin  40 mg Oral q1800   Continuous Infusions: . dextrose 5 % and 0.45% NaCl 100 mL/hr at 06/15/14 0157

## 2014-06-15 NOTE — Telephone Encounter (Signed)
Spoke with patient to schedule, dpm °

## 2014-06-15 NOTE — Progress Notes (Addendum)
Inpatient Diabetes Program Recommendations  AACE/ADA: New Consensus Statement on Inpatient Glycemic Control (2013)  Target Ranges:  Prepandial:   less than 140 mg/dL      Peak postprandial:   less than 180 mg/dL (1-2 hours)      Critically ill patients:  140 - 180 mg/dL   Reason for Assessment:  Referral received.  Note that patient recently discharged from hospital and experienced hypoglycemia at home.  Diabetes history: Type 2 diabetes Outpatient Diabetes medications: Glyburide 5 mg daily, Levemir 30 units daily, Metformin 1000 mg bid, Invokana 300 mg daily Current orders for Inpatient glycemic control:  Novolog sensitive q 4 hours  Note that patient is also on Levaquin which can sometimes affect blood glucoses or even increase risk of hypoglycemia.  Consider holding Glyburide and Invokana while patient is on antibiotic and appetite is decreased.  Also may need to decrease Levemir to 15 units q HS until appetite improves and patient is no longer on antibiotic.  Sent text page to MD to discuss.  Agree with only Novolog correction in the hospital for now until CBG's are consistently greater than 150 mg/dL.  Thanks, Adah Perl, RN, BC-ADM Inpatient Diabetes Coordinator Pager [phone]    1430:  Spoke to patient regarding low CBG's.  She states that she did take her Levemir on Saturday night after being discharged from the hospital.  She states that she was very active on Sunday and continued to treat her low blood sugars but could not keep them up.  Briefly discussed potential need for decrease in medications.  Patient seems frustrated to be back in the hospital but also thankful that she came in.

## 2014-06-16 LAB — GLUCOSE, CAPILLARY
GLUCOSE-CAPILLARY: 178 mg/dL — AB (ref 70–99)
Glucose-Capillary: 155 mg/dL — ABNORMAL HIGH (ref 70–99)
Glucose-Capillary: 275 mg/dL — ABNORMAL HIGH (ref 70–99)

## 2014-06-16 LAB — HEMOGLOBIN A1C
HEMOGLOBIN A1C: 7.2 % — AB (ref 4.8–5.6)
MEAN PLASMA GLUCOSE: 160 mg/dL

## 2014-06-16 LAB — BASIC METABOLIC PANEL
Anion gap: 9 (ref 5–15)
BUN: 7 mg/dL (ref 6–23)
CO2: 24 mmol/L (ref 19–32)
Calcium: 9 mg/dL (ref 8.4–10.5)
Chloride: 108 mmol/L (ref 96–112)
Creatinine, Ser: 0.76 mg/dL (ref 0.50–1.10)
GFR calc Af Amer: >90 mL/min (ref >90)
GLUCOSE: 172 mg/dL — AB (ref 70–99)
POTASSIUM: 3.5 mmol/L (ref 3.5–5.1)
Sodium: 141 mmol/L (ref 135–145)

## 2014-06-16 LAB — CBC
HCT: 30.8 % — ABNORMAL LOW (ref 36.0–46.0)
HEMOGLOBIN: 9.9 g/dL — AB (ref 12.0–15.0)
MCH: 29.2 pg (ref 26.0–34.0)
MCHC: 32.1 g/dL (ref 30.0–36.0)
MCV: 90.9 fL (ref 78.0–100.0)
Platelets: 325 10*3/uL (ref 150–400)
RBC: 3.39 MIL/uL — AB (ref 3.87–5.11)
RDW: 14.3 % (ref 11.5–15.5)
WBC: 7.8 10*3/uL (ref 4.0–10.5)

## 2014-06-16 MED ORDER — INSULIN DETEMIR 100 UNIT/ML FLEXPEN: 15.0000 [IU] | PEN_INJECTOR | Freq: Every day | SUBCUTANEOUS | Status: DC

## 2014-06-16 MED ORDER — METFORMIN HCL 500 MG PO TABS
500.0000 mg | ORAL_TABLET | Freq: Two times a day (BID) | ORAL | Status: DC
Start: 2014-06-16 — End: 2014-08-04

## 2014-06-16 NOTE — Progress Notes (Signed)
        Left foot dry dressing removed.  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   Doppler signals PT/DP Leg incisions well healed  S/P re-do left fem-pop bypass with propatent graft. She has a follow up visit scheduled with Dr. Fae Pippin, [facility] PA-C

## 2014-06-16 NOTE — Discharge Instructions (Signed)
Diabetes and Exercise Exercising regularly is important. It is not just about losing weight. It has many health benefits, such as:  Improving your overall fitness, flexibility, and endurance.  Increasing your bone density.  Helping with weight control.  Decreasing your body fat.  Increasing your muscle strength.  Reducing stress and tension.  Improving your overall health. People with diabetes who exercise gain additional benefits because exercise:  Reduces appetite.  Improves the body's use of blood sugar (glucose).  Helps lower or control blood glucose.  Decreases blood pressure.  Helps control blood lipids (such as cholesterol and triglycerides).  Improves the body's use of the hormone insulin by:  Increasing the body's insulin sensitivity.  Reducing the body's insulin needs.  Decreases the risk for heart disease because exercising:  Lowers cholesterol and triglycerides levels.  Increases the levels of good cholesterol (such as high-density lipoproteins [HDL]) in the body.  Lowers blood glucose levels. YOUR ACTIVITY PLAN  Choose an activity that you enjoy and set realistic goals. Your health care provider or diabetes educator can help you make an activity plan that works for you. Exercise regularly as directed by your health care provider. This includes:  Performing resistance training twice a week such as push-ups, sit-ups, lifting weights, or using resistance bands.  Performing 150 minutes of cardio exercises each week such as walking, running, or playing sports.  Staying active and spending no more than 90 minutes at one time being inactive. Even short bursts of exercise are good for you. Three 10-minute sessions spread throughout the day are just as beneficial as a single 30-minute session. Some exercise ideas include:  Taking the dog for a walk.  Taking the stairs instead of the elevator.  Dancing to your favorite song.  Doing an exercise  video.  Doing your favorite exercise with a friend. RECOMMENDATIONS FOR EXERCISING WITH TYPE 1 OR TYPE 2 DIABETES   Check your blood glucose before exercising. If blood glucose levels are greater than 240 mg/dL, check for urine ketones. Do not exercise if ketones are present.  Avoid injecting insulin into areas of the body that are going to be exercised. For example, avoid injecting insulin into:  The arms when playing tennis.  The legs when jogging.  Keep a record of:  Food intake before and after you exercise.  Expected peak times of insulin action.  Blood glucose levels before and after you exercise.  The type and amount of exercise you have done.  Review your records with your health care provider. Your health care provider will help you to develop guidelines for adjusting food intake and insulin amounts before and after exercising.  If you take insulin or oral hypoglycemic agents, watch for signs and symptoms of hypoglycemia. They include:  Dizziness.  Shaking.  Sweating.  Chills.  Confusion.  Drink plenty of water while you exercise to prevent dehydration or heat stroke. Body water is lost during exercise and must be replaced.  Talk to your health care provider before starting an exercise program to make sure it is safe for you. Remember, almost any type of activity is better than none. Document Released: 07/08/2003 Document Revised: 09/01/2013 Document Reviewed: 09/24/2012 Exit[employer] Patient Information 2015 [employer]. This information is not intended to replace advice given to you by your health care provider. Make sure you discuss any questions you have with your health care provider.  

## 2014-06-16 NOTE — Discharge Summary (Signed)
Physician Discharge Summary  Theresa Harrington WFU:[id number] DOB: Aug 07, 1955 DOA: 06/14/2014  PCP: Scarlette Calico, MD  Admit date: 06/14/2014 Discharge date: 06/16/2014  Recommendations for Outpatient Follow-up:  1. Pt will need to follow up with PCP in 2-3 weeks post discharge 2. Please obtain BMP to evaluate electrolytes and kidney function 3. Please also check CBC to evaluate Hg and Hct levels 4. Please note that Invokana and Glyburide discontinued due to to severe hypoglycemia, CBG's in 40, last A1C 7.9 (03/2014) 5. In addition, metformin dose lowered from 1000 mg to 500 mg BID and Levemir does lowered from 30 U to 15 U QHS 6. Pt did well on this regimen and advised to check her CBG's regularly and to call PCP is CBG > 250 so that antihyperglycemic regimen can be readjusted appropriately  7. Please note that Hyzaar stopped due to hypotension and SBP in 80 -90 8. Pt advised to discuss these changes with PCP as some of the medications may need to be resumed if becomes indicated 9. Pt advised to check BP regularly and if BP persistently > 140/90, pt advised to call PCP so that antihypertensive regimen can be restarted   Discharge Diagnoses:  Principal Problem:   Hypoglycemia Active Problems:   Diabetes mellitus with peripheral vascular disease   Hyperlipidemia with target LDL less than 100   Anemia   Essential hypertension, benign   PAD (peripheral artery disease)   Thyroid disease   Hypokalemia   Ischemic toe    Discharge Condition: Stable  Diet recommendation: Heart healthy diet discussed in details   History of present illness:    Brief narrative:    59 y.o. female with a history of DM2, PAD with recent Left Fem Pop Graft on 06/09/2014 (discharged 06/14/2014), HTN, Hyperlipidemia, Hypothyroid who presented to [facility] ED with main concern of weakness and low sugar levels at home down to 40- 60's even with eating. She is on several different antihyperglycemic medications including  Levemir, Invokana, Glyburide, Metformin and last A1C in 03/2014 was 7.9. She reports compliance with medical regimen. She is also taking Levaquin for non healing ulcer on the the 2nd left toe.   Assessment/Plan:    Principal Problem:  Hypoglycemic events - likely secondary to combination of antihyperglycemics pt was taking in the setting of still poor oral intake - levemir dose cut down from 30 U to 15 U QHS and Metformin dose lowered to 500 mg BID - Glyburide and Ivokana stopped for now  - pt did well on this regimen and advised to follow up with PCP to make any needed readjustments  Active Problems:  Diabetes mellitus with peripheral vascular disease - please see changes to medical regimen above   Hyperlipidemia with target LDL less than 100 - continue statin   Left foot wound, erythema  - s/p arteriogram with bilateral runoff performed on 06/04/14 revealed bilateral common femoral artery high-grade lesions and superficial femoral occlusion, reconstitution of the below knee popliteal artery on the left with a stenosis at the distal tip with three-vessel runoff, reconstitution of the right below-knee popliteal artery - POD #6 s/p L AT EA, TE L pop, L CFA to BK pop BPG with Propaten - POD #8 s/p L EIA and CFA with PA, L CFA to BK pop BPG w/ NR GSV - continue Levaquin as was recommended upon discharge by vascular surgery - Dr. Trula Slade notified   Anemia of chronic disease, DM, IDA - Hg remains stable 9 -10 - no signs of active  bleeding - continue iron supplementation   Hypokalemia - supplemented   Essential hypertension, benign - Hyzaar stopped due to hypotension and SBP in 80 -90's  PAD (peripheral artery disease) - continue aspirin and plavix   Code Status: Full.  Family Communication: plan of care discussed with the patient Disposition Plan: Home   IV access:  Peripheral IV  Procedures and diagnostic studies:   None   Medical Consultants:   None  Other Consultants:  Diabetic educator   IAnti-Infectives:   Levaquin as per previous home regimen       Discharge Exam: Filed Vitals:   06/16/14 0535  BP: 98/54  Pulse: 53  Temp: 97.8 F (36.6 C)  Resp: 16   Filed Vitals:   06/15/14 1000 06/15/14 1800 06/15/14 2150 06/16/14 0535  BP: 108/61 105/53 90/45 98/54   Pulse: 57 85 55 53  Temp: 97.8 F (36.6 C) 97.7 F (36.5 C) 98 F (36.7 C) 97.8 F (36.6 C)  TempSrc: Oral Oral Oral Oral  Resp: 18  18 16   Weight:   82.373 kg (181 lb 9.6 oz)   SpO2: 96% 100% 99% 93%    General: Pt is alert, follows commands appropriately, not in acute distress Cardiovascular: Regular rate and rhythm, S1/S2 +, no murmurs, no rubs, no gallops Respiratory: Clear to auscultation bilaterally, no wheezing, no crackles, no rhonchi Abdominal: Soft, non tender, non distended, bowel sounds +, no guarding Extremities: no edema, no cyanosis, pulses palpable bilaterally DP and PT Neuro: Grossly nonfocal  Discharge Instructions  Discharge Instructions    Diet - low sodium heart healthy    Complete by:  As directed      Increase activity slowly    Complete by:  As directed             Medication List    STOP taking these medications        canagliflozin 300 MG Tabs tablet  Commonly known as:  INVOKANA     glyBURIDE 5 MG tablet  Commonly known as:  DIABETA     losartan-hydrochlorothiazide 100-12.5 MG per tablet  Commonly known as:  HYZAAR      TAKE these medications        aspirin EC 325 MG tablet  Take 325 mg by mouth daily.     clopidogrel 75 MG tablet  Commonly known as:  PLAVIX  Take 1 tablet (75 mg total) by mouth daily.     ezetimibe 10 MG tablet  Commonly known as:  ZETIA  Take 1 tablet (10 mg total) by mouth daily.     gabapentin 300 MG capsule  Commonly known as:  NEURONTIN  Take 1 capsule (300 mg total) by mouth 3 (three) times daily.     Insulin Detemir 100 UNIT/ML Pen  Commonly known as:  LEVEMIR  FLEXTOUCH  Inject 15 Units into the skin daily at 10 pm. DX 250.72     Insulin Pen Needle 32G X 4 MM Misc  Commonly known as:  NOVOFINE PLUS  - Use to inject once a day  - Dx 250.00     Iron 325 (65 FE) MG Tabs  Take 325 mg by mouth daily.     levofloxacin 500 MG tablet  Commonly known as:  LEVAQUIN  Take 1 tablet (500 mg total) by mouth daily.     Magnesium Oxide 400 MG Caps  Take 1 capsule (400 mg total) by mouth daily.     metFORMIN 500 MG tablet  Commonly known as:  GLUCOPHAGE  Take 1 tablet (500 mg total) by mouth 2 (two) times daily.     oxyCODONE 5 MG immediate release tablet  Commonly known as:  Oxy IR/ROXICODONE  Take 1-2 tablets (5-10 mg total) by mouth every 6 (six) hours as needed for severe pain.     pantoprazole 40 MG tablet  Commonly known as:  PROTONIX  Take 40 mg by mouth daily.     Pitavastatin Calcium 2 MG Tabs  Commonly known as:  LIVALO  Take 1 tablet (2 mg total) by mouth daily.           Follow-up Information    Follow up with Scarlette Calico, MD.   Specialty:  Internal Medicine   Contact information:   [address] [place] [address] [phone]       Follow up with Faye Ramsay, MD.   Specialty:  Internal Medicine   Why:  As needed, call my cell phone [phone]   Contact information:   [address] [place] [address] [phone]        The results of significant diagnostics from this hospitalization (including imaging, microbiology, ancillary and laboratory) are listed below for reference.     Microbiology: Recent Results (from the past 240 hour(s))  Surgical pcr screen     Status: None   Collection Time: 06/07/14  6:41 PM  Result Value Ref Range Status   MRSA, PCR NEGATIVE NEGATIVE Final   Staphylococcus aureus NEGATIVE NEGATIVE Final    Comment:        The Xpert SA Assay (FDA approved for NASAL specimens in patients over 31 years of age), is one component of a  comprehensive surveillance program.  Test performance has been validated by [facility] for patients greater than or equal to 70 year old. It is not intended to diagnose infection nor to guide or monitor treatment.      Labs: Basic Metabolic Panel:  Recent Labs Lab 06/09/14 2024 06/11/14 0607 06/14/14 2204 06/15/14 0715 06/16/14 0545  NA  --  141 140 141 141  K  --  3.5 3.0* 3.5 3.5  CL  --  106 108 106 108  CO2  --  26 27 23 24   GLUCOSE 143* 74 72 149* 172*  BUN  --  14 8 6 7   CREATININE  --  0.93 0.96 0.83 0.76  CALCIUM  --  8.7 9.0 8.5 9.0   Liver Function Tests:  Recent Labs Lab 06/14/14 2204  AST 19  ALT 13  ALKPHOS 31*  BILITOT 0.5  PROT 6.1  ALBUMIN 2.4*   CBC:  Recent Labs Lab 06/12/14 0423 06/13/14 0430 06/14/14 2204 06/15/14 0715 06/16/14 0545  WBC 9.9 10.5 10.4 9.5 7.8  NEUTROABS  --   --  6.8  --   --   HGB 9.7* 10.0* 9.2* 9.7* 9.9*  HCT 30.4* 31.9* 28.2* 29.7* 30.8*  MCV 90.5 90.9 88.7 87.6 90.9  PLT 288 301 329 343 325   CBG:  Recent Labs Lab 06/15/14 1651 06/15/14 2041 06/15/14 2345 06/16/14 0357 06/16/14 0754  GLUCAP 200* 125* 111* 178* 155*     SIGNED: Time coordinating discharge: Over 30 minutes  Faye Ramsay, MD  [facility] 06/16/2014, 10:23 AM Pager [phone]  If 7PM-7AM, please contact night-coverage [email or web address] Password [email or web address]

## 2014-06-17 NOTE — Progress Notes (Signed)
CARE MANAGEMENT NOTE 06/17/2014  Patient:  Theresa Harrington, Theresa Harrington   Account Number:  [id number]  Date Initiated:  06/17/2014  Documentation initiated by:  [facility]  Subjective/Objective Assessment:   Hypoglycemia     Action/Plan:   Anticipated DC Date:  06/16/2014   Anticipated DC Plan:  [place]  CM consult      Choice offered to / List presented to:             Status of service:  Completed, signed off Medicare Important Message given?   (If response is "NO", the following Medicare IM given date fields will be blank) Date Medicare IM given:   Medicare IM given by:   Date Additional Medicare IM given:   Additional Medicare IM given by:    Discharge Disposition:  HOME/SELF CARE  Per UR Regulation:    If discussed at Long Length of Stay Meetings, dates discussed:    Comments:  06/17/2014 1030 Chart reviewed. No NCM needs identified. Jonnie Finner RN CCM Case Mgmt phone [phone]   06/15/2014 1100 NCM spoke to pt and states she has glucometer at home. Lives at home alone. States she is able to afford medications. Jonnie Finner RN CCM Case Mgmt phone [phone]

## 2014-06-18 ENCOUNTER — Other Ambulatory Visit: Payer: Self-pay | Admitting: Internal Medicine

## 2014-06-24 ENCOUNTER — Ambulatory Visit: Payer: 59 | Admitting: Internal Medicine

## 2014-06-25 ENCOUNTER — Encounter: Payer: Self-pay | Admitting: Internal Medicine

## 2014-06-25 ENCOUNTER — Ambulatory Visit (INDEPENDENT_AMBULATORY_CARE_PROVIDER_SITE_OTHER): Payer: 59 | Admitting: Internal Medicine

## 2014-06-25 VITALS — BP 142/70 | HR 66 | Temp 98.2°F | Resp 16 | Ht 65.0 in | Wt 176.5 lb

## 2014-06-25 DIAGNOSIS — E538 Deficiency of other specified B group vitamins: Secondary | ICD-10-CM

## 2014-06-25 DIAGNOSIS — I10 Essential (primary) hypertension: Secondary | ICD-10-CM

## 2014-06-25 DIAGNOSIS — Z23 Encounter for immunization: Secondary | ICD-10-CM

## 2014-06-25 DIAGNOSIS — E1151 Type 2 diabetes mellitus with diabetic peripheral angiopathy without gangrene: Secondary | ICD-10-CM

## 2014-06-25 MED ORDER — CYANOCOBALAMIN 1000 MCG/ML IJ SOLN
1000.0000 ug | Freq: Once | INTRAMUSCULAR | Status: AC
  Administered 2014-06-25: 1000 ug via INTRAMUSCULAR

## 2014-06-25 NOTE — Patient Instructions (Signed)

## 2014-06-25 NOTE — Progress Notes (Signed)
   Subjective:    Patient ID: Theresa Harrington, female    DOB: February 23, 1956, 59 y.o.   MRN: [id number]  HPI Comments: She returns for a check-in, she has an infected toe on her left feet that is being treated with antibiotics and she is s/p a revascularization procedure on her LLE. She feels well and offers no new or different complaints today.     Review of Systems  Constitutional: Negative.  Negative for fever, chills, diaphoresis, appetite change and fatigue.  HENT: Negative.   Eyes: Negative.   Respiratory: Negative.  Negative for cough, choking, chest tightness, shortness of breath and stridor.   Cardiovascular: Negative.  Negative for chest pain, palpitations and leg swelling.  Gastrointestinal: Negative.  Negative for nausea, abdominal pain, diarrhea, constipation and blood in stool.  Endocrine: Negative.   Genitourinary: Negative.   Musculoskeletal: Negative.   Skin: Negative.   Allergic/Immunologic: Negative.   Neurological: Negative.  Negative for dizziness.  Hematological: Negative.  Negative for adenopathy. Does not bruise/bleed easily.  Psychiatric/Behavioral: Negative.   All other systems reviewed and are negative.      Objective:   Physical Exam  Constitutional: She is oriented to person, place, and time. She appears well-developed and well-nourished. No distress.  HENT:  Head: Normocephalic and atraumatic.  Mouth/Throat: Oropharynx is clear and moist. No oropharyngeal exudate.  Eyes: Conjunctivae are normal. Right eye exhibits no discharge. Left eye exhibits no discharge. No scleral icterus.  Neck: Normal range of motion. Neck supple. No JVD present. No tracheal deviation present. No thyromegaly present.  Cardiovascular: Normal rate, regular rhythm, normal heart sounds and intact distal pulses.  Exam reveals no gallop and no friction rub.   No murmur heard. Pulmonary/Chest: Effort normal and breath sounds normal. No stridor. No respiratory distress. She has no wheezes.  She has no rales. She exhibits no tenderness.  Abdominal: Soft. Bowel sounds are normal. She exhibits no distension and no mass. There is no tenderness. There is no rebound and no guarding.  Musculoskeletal: Normal range of motion. She exhibits no edema or tenderness.  Lymphadenopathy:    She has no cervical adenopathy.  Neurological: She is oriented to person, place, and time.  Skin: Skin is warm and dry. No rash noted. She is not diaphoretic. No erythema. No pallor.  Vitals reviewed.     Lab Results  Component Value Date   WBC 7.8 06/16/2014   HGB 9.9* 06/16/2014   HCT 30.8* 06/16/2014   PLT 325 06/16/2014   GLUCOSE 172* 06/16/2014   CHOL 227* 02/05/2014   TRIG 140.0 02/05/2014   HDL 46.40 02/05/2014   LDLDIRECT 160.2 03/01/2010   LDLCALC 153* 02/05/2014   ALT 13 06/14/2014   AST 19 06/14/2014   NA 141 06/16/2014   K 3.5 06/16/2014   CL 108 06/16/2014   CREATININE 0.76 06/16/2014   BUN 7 06/16/2014   CO2 24 06/16/2014   TSH 0.596 06/15/2014   INR 1.03 06/01/2014   HGBA1C 7.2* 06/15/2014   MICROALBUR 3.5* 03/01/2010      Assessment & Plan:

## 2014-06-25 NOTE — Progress Notes (Signed)
Pre visit review using our clinic review tool, if applicable. No additional management support is needed unless otherwise documented below in the visit note. 

## 2014-06-26 ENCOUNTER — Encounter: Payer: Self-pay | Admitting: Surgery

## 2014-06-28 NOTE — Assessment & Plan Note (Signed)
Her BP is well controlled 

## 2014-06-28 NOTE — Assessment & Plan Note (Signed)
Recent A1C shows good blood sugar control

## 2014-06-29 ENCOUNTER — Ambulatory Visit (INDEPENDENT_AMBULATORY_CARE_PROVIDER_SITE_OTHER): Payer: 59 | Admitting: Surgery

## 2014-06-29 ENCOUNTER — Encounter: Payer: Self-pay | Admitting: Surgery

## 2014-06-29 VITALS — BP 138/78 | HR 62 | Resp 16 | Ht 65.0 in | Wt 175.0 lb

## 2014-06-29 DIAGNOSIS — T814XXD Infection following a procedure, subsequent encounter: Secondary | ICD-10-CM

## 2014-06-29 DIAGNOSIS — L97509 Non-pressure chronic ulcer of other part of unspecified foot with unspecified severity: Secondary | ICD-10-CM

## 2014-06-29 DIAGNOSIS — I70299 Other atherosclerosis of native arteries of extremities, unspecified extremity: Secondary | ICD-10-CM

## 2014-06-29 DIAGNOSIS — IMO0001 Reserved for inherently not codable concepts without codable children: Secondary | ICD-10-CM

## 2014-06-29 MED ORDER — SULFAMETHOXAZOLE-TRIMETHOPRIM 800-160 MG PO TABS: 1.0000 | ORAL_TABLET | Freq: Two times a day (BID) | ORAL | Status: DC

## 2014-06-29 NOTE — Progress Notes (Signed)
The patient presented in February with swelling and redness of her left foot.  She had her toenail removed earlier.  She was admitted for IV antibiotics.  She initially underwent left femoral-popliteal bypass graft by Dr. early on 02/06/2015.  Unfortunately this occluded early.  I took her back to the operating room on 06/09/2014 and redid her operation with Gore-Tex, 6 mm propatent.  She was ultimately discharged from the hospital on IV antibiotics.  She is here today for follow-up.  He does not endorse significant pain  Her incisions have healed with the exception of a small area at the inferior femoral incision.  There is a palpable left dorsalis pedis pulse.  She has significant edema in the leg.  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  Second toe is erythematous down to its base.  I am referring the patient to the wound center for consideration of hyperbaric oxygen to see if we can get this toe to heal.  Hopefully the small opening at the base of the toe will heal and the tip of the toe water amputate.  The patient recently completed a course of Levaquin.  I'm switching her to Bactrim.  She'll follow up in 3 weeks

## 2014-06-29 NOTE — Addendum Note (Signed)
Addended by: Mena Goes on: 06/29/2014 01:38 PM   Modules accepted: Orders

## 2014-07-01 ENCOUNTER — Encounter (HOSPITAL_BASED_OUTPATIENT_CLINIC_OR_DEPARTMENT_OTHER): Payer: 59 | Attending: Surgery

## 2014-07-01 DIAGNOSIS — Z79899 Other long term (current) drug therapy: Secondary | ICD-10-CM | POA: Diagnosis not present

## 2014-07-01 DIAGNOSIS — Z794 Long term (current) use of insulin: Secondary | ICD-10-CM | POA: Insufficient documentation

## 2014-07-01 DIAGNOSIS — Z7982 Long term (current) use of aspirin: Secondary | ICD-10-CM | POA: Insufficient documentation

## 2014-07-01 DIAGNOSIS — Z792 Long term (current) use of antibiotics: Secondary | ICD-10-CM | POA: Insufficient documentation

## 2014-07-01 DIAGNOSIS — L97522 Non-pressure chronic ulcer of other part of left foot with fat layer exposed: Secondary | ICD-10-CM | POA: Insufficient documentation

## 2014-07-01 DIAGNOSIS — I70222 Atherosclerosis of native arteries of extremities with rest pain, left leg: Secondary | ICD-10-CM | POA: Insufficient documentation

## 2014-07-01 DIAGNOSIS — Z7902 Long term (current) use of antithrombotics/antiplatelets: Secondary | ICD-10-CM | POA: Insufficient documentation

## 2014-07-01 DIAGNOSIS — E1152 Type 2 diabetes mellitus with diabetic peripheral angiopathy with gangrene: Secondary | ICD-10-CM | POA: Diagnosis not present

## 2014-07-01 DIAGNOSIS — I739 Peripheral vascular disease, unspecified: Secondary | ICD-10-CM | POA: Insufficient documentation

## 2014-07-01 DIAGNOSIS — E11621 Type 2 diabetes mellitus with foot ulcer: Secondary | ICD-10-CM | POA: Diagnosis not present

## 2014-07-01 DIAGNOSIS — I10 Essential (primary) hypertension: Secondary | ICD-10-CM | POA: Diagnosis not present

## 2014-07-01 DIAGNOSIS — Z87891 Personal history of nicotine dependence: Secondary | ICD-10-CM | POA: Diagnosis not present

## 2014-07-01 DIAGNOSIS — Z89422 Acquired absence of other left toe(s): Secondary | ICD-10-CM | POA: Insufficient documentation

## 2014-07-02 ENCOUNTER — Telehealth: Payer: Self-pay

## 2014-07-02 NOTE — Telephone Encounter (Signed)
I have scheduled Theresa Harrington for 07/06/14 @ 1:45pm

## 2014-07-02 NOTE — Telephone Encounter (Signed)
Pt. notified of appt. At 1:45 PM 07/06/14; agrees with plan.

## 2014-07-02 NOTE — Telephone Encounter (Signed)
Phone call from pt.  Stated her left leg from ankle to knee is "a little pinkish-red."   Last evaluated per Dr. Trula Slade on Monday (2/29).  Stated she saw the Wound MD yesterday (3/2), and was told it is not infected.  Asking to have left leg checked today.  Denied any increased pain.  Stated the incisions look fine; denied redness or drainage of incisions.  Denied fever/ chills.  Discussed with Dr. Oneida Alar.  Recommended to schedule pt. to see Dr. Trula Slade next Monday.

## 2014-07-03 ENCOUNTER — Encounter: Payer: Self-pay | Admitting: Surgery

## 2014-07-06 ENCOUNTER — Other Ambulatory Visit: Payer: Self-pay

## 2014-07-06 ENCOUNTER — Ambulatory Visit (INDEPENDENT_AMBULATORY_CARE_PROVIDER_SITE_OTHER): Payer: Self-pay | Admitting: Surgery

## 2014-07-06 ENCOUNTER — Encounter: Payer: Self-pay | Admitting: Surgery

## 2014-07-06 VITALS — BP 121/59 | HR 74 | Temp 98.0°F | Ht 65.0 in | Wt 173.8 lb

## 2014-07-06 DIAGNOSIS — I70299 Other atherosclerosis of native arteries of extremities, unspecified extremity: Secondary | ICD-10-CM

## 2014-07-06 DIAGNOSIS — L97509 Non-pressure chronic ulcer of other part of unspecified foot with unspecified severity: Secondary | ICD-10-CM

## 2014-07-06 NOTE — Progress Notes (Signed)
Patient name: Theresa Harrington MRN: [id number] DOB: [birth year] Sex: female     Chief Complaint  Patient presents with  . Re-evaluation    c/o Lt LE discoloration - s/p L fem pop BP     HISTORY OF PRESENT ILLNESS: The patient presented in February with swelling and redness of her left foot. She had her toenail removed earlier. She was admitted for IV antibiotics. She initially underwent left femoral-popliteal bypass graft by Dr. early on 02/06/2015. Unfortunately this occluded early. I took her back to the operating room on 06/09/2014 and redid her operation with Gore-Tex, 6 mm propatent. She was ultimately discharged from the hospital on IV antibiotics.  I sent her to the wound center for hyperbaric oxygen to see if we can get this to heal, however there is issues with insurance approval.  Past Medical History  Diagnosis Date  . Hyperlipidemia   . Hypertension   . PONV (postoperative nausea and vomiting)   . Right ureteral stone   . PAD (peripheral artery disease)   . Atherosclerosis of aorta   . GERD (gastroesophageal reflux disease)   . Asymptomatic cholelithiasis   . Peripheral arterial occlusive disease     lower extremities  . Wears glasses   . Hypothyroidism     no meds  . Kidney stones   . Type 2 diabetes mellitus   . Anemia     Past Surgical History  Procedure Laterality Date  . Belpharoptosis repair      eyelid lift  . Cystoscopy with stent placement Right 06/04/2013    Procedure: CYSTOSCOPY WITH STENT PLACEMENT;  Surgeon: Franchot Gallo, MD;  Location: [facility];  Service: Urology;  Laterality: Right;  . Right knee patellectomy w/ repair of extensor mechanism  04-14-2002  . Orif fifth metacarpal fx  right hand  04-21-2002  . Laparoscopic gastric banding  05-29-2005  . Revision and re-siting lap-band port  04-08-2010    W/  UPPER EGD  . Combined augmentation mammaplasty and abdominoplasty  2009    W/  BILATERAL  THIGH LIFT  . Cysto/  right ureteral stent  placement  12-30-2010  . Extracorporeal shock wave lithotripsy Right 08-04-2013//   06-23-2013//   01-16-2011  . Cystoscopy with retrograde pyelogram, ureteroscopy and stent placement Right 10/07/2013    Procedure: CYSTOSCOPY WITH RETROGRADE PYELOGRAM, right URETEROSCOPY AND STENT PLACEMENT, stone extraction;  Surgeon: Arvil Persons, MD;  Location: [facility];  Service: Urology;  Laterality: Right;  . Holmium laser application Right 01/06/3531    Procedure: HOLMIUM LASER APPLICATION;  Surgeon: Arvil Persons, MD;  Location: [facility];  Service: Urology;  Laterality: Right;  . Kidney stone surgery  April 2015    1-2 stones  . Toenail removed Left Jan. 21, 2016    2nd toenail-  Dr. Barkley Bruns  . Augmentation mammaplasty    . Lithotripsy  2-3 times  . Lower extremity angiogram N/A 06/04/2014    Procedure: LOWER EXTREMITY ANGIOGRAM;  Surgeon: Serafina Mitchell, MD;  Location: [facility] C[facility];  Service: Cardiovascular;  Laterality: N/A;  . Endarterectomy femoral Left 06/08/2014    Procedure: Left Leg Common Femoral and External Iliac  Endartarectomy with patch Angioplasty;  Surgeon: Rosetta Posner, MD;  Location: [place] [facility];  Service: Vascular;  Laterality: Left;  . Femoral-popliteal bypass graft Left 06/08/2014    Procedure: Left Leg Femoral -Popliteal Bypass Graft;  Surgeon: Rosetta Posner, MD;  Location: [place];  Service: Vascular;  Laterality:  Left;  . Femoral-popliteal bypass graft Left 06/09/2014    Procedure: Left Femoral and Popliteal Exposure; Left Femoral to Anterior Tibial Bypass Graft using Propaten 89mm by 80cm Goretex Graft; Left Tibial Endarterectomy; Left Femoraland Popliteal Thrombectomy ;  Surgeon: Serafina Mitchell, MD;  Location: [place];  Service: Vascular;  Laterality: Left;    History   Social History  . Marital Status: Single    Spouse Name: N/A  . Number of Children: N/A  . Years of Education: N/A   Occupational History  . tester    Social History Main Topics  . Smoking  status: Former Smoker -- 1.00 packs/day for 28 years    Types: Cigarettes    Quit date: 09/05/2001  . Smokeless tobacco: Never Used  . Alcohol Use: No  . Drug Use: No  . Sexual Activity: Yes   Other Topics Concern  . Not on file   Social History Narrative   Regular exercise- yes    Family History  Problem Relation Age of Onset  . Arthritis Other   . Cancer Other     colon  . Hypertension Other   . Stroke Other   . Diabetes Father   . Heart disease Father   . Deep vein thrombosis Father   . Hyperlipidemia Father   . Diabetes Sister   . Heart disease Sister   . Deep vein thrombosis Sister   . Hyperlipidemia Sister   . Diabetes Brother   . Heart disease Brother   . Cancer Brother   . Hyperlipidemia Brother   . Colon cancer Maternal Aunt   . Diabetes Brother   . Diabetes Brother   . Kidney disease Mother   . Hyperlipidemia Mother   . Other Mother     AAA   and    Amputation    Allergies as of 07/06/2014 - Review Complete 07/06/2014  Allergen Reaction Noted  . Amlodipine Other (See Comments) 11/26/2013  . Atorvastatin Other (See Comments) 10/28/2013  . Ampicillin Nausea And Vomiting 04/12/2011  . Codeine Nausea And Vomiting   . Lisinopril Other (See Comments) 10/05/2008  . Penicillins Nausea And Vomiting 05/01/2011  . Januvia [sitagliptin]  12/21/2011    Current Outpatient Prescriptions on File Prior to Visit  Medication Sig Dispense Refill  . aspirin EC 325 MG tablet Take 325 mg by mouth daily.    . clopidogrel (PLAVIX) 75 MG tablet Take 1 tablet (75 mg total) by mouth daily. 30 tablet 3  . ezetimibe (ZETIA) 10 MG tablet Take 1 tablet (10 mg total) by mouth daily. (Patient taking differently: Take 10 mg by mouth at bedtime. ) 90 tablet 3  . Ferrous Sulfate (IRON) 325 (65 FE) MG TABS Take 325 mg by mouth daily.     [name] gabapentin (NEURONTIN) 300 MG capsule Take 1 capsule (300 mg total) by mouth 3 (three) times daily. (Patient taking differently: Take 300 mg by  mouth 3 (three) times daily. Taking different. Take twice daily) 90 capsule 11  . Insulin Detemir (LEVEMIR FLEXTOUCH) 100 UNIT/ML Pen Inject 15 Units into the skin daily at 10 pm. DX 250.72 15 mL 11  . Insulin Pen Needle (NOVOFINE PLUS) 32G X 4 MM MISC Use to inject once a day Dx 250.00 100 each 3  . levofloxacin (LEVAQUIN) 500 MG tablet Take 1 tablet (500 mg total) by mouth daily. (Patient taking differently: Take 500 mg by mouth daily. 15 day course started 06/14/14) 15 tablet 0  . Magnesium Oxide 400 MG CAPS  Take 1 capsule (400 mg total) by mouth daily. 90 capsule 3  . metFORMIN (GLUCOPHAGE) 500 MG tablet Take 1 tablet (500 mg total) by mouth 2 (two) times daily. 60 tablet 0  . oxyCODONE (OXY IR/ROXICODONE) 5 MG immediate release tablet Take 1-2 tablets (5-10 mg total) by mouth every 6 (six) hours as needed for severe pain. 30 tablet 0  . pantoprazole (PROTONIX) 40 MG tablet Take 40 mg by mouth daily.     . pantoprazole (PROTONIX) 40 MG tablet TAKE ONE TABLET BY MOUTH ONCE DAILY 90 tablet 3  . Pitavastatin Calcium (LIVALO) 2 MG TABS Take 1 tablet (2 mg total) by mouth daily. (Patient taking differently: Take 2 mg by mouth at bedtime. ) 30 tablet 11  . sulfamethoxazole-trimethoprim (BACTRIM DS,SEPTRA DS) 800-160 MG per tablet Take 1 tablet by mouth 2 (two) times daily. 28 tablet 0   No current facility-administered medications on file prior to visit.       PHYSICAL EXAMINATION:   Vital signs are BP 121/59 mmHg  Pulse 74  Temp(Src) 98 F (36.7 C) (Oral)  Ht 5\' 5"  (1.651 m)  Wt 173 lb 12.8 oz (78.835 kg)  BMI 28.92 kg/m2  SpO2 99% General: The patient appears their stated age. HEENT:  No gross abnormalities Pulmonary:  Non labored breathing Musculoskeletal: There are no major deformities. Neurologic: No focal weakness or paresthesias are detected, Skin: Necrotic tip to the left second toe with edema and erythema extending to the base of the toe as well as a linear crack between the  second and third toe Psychiatric: The patient has normal affect. Cardiovascular: Significant left leg swelling.  I was able to get a brisk posterior tibial and dorsalis pedis Doppler signal.  I traced the signal all the way out to the base of the toes with a good signal   Diagnostic Studies None  Assessment: Atherosclerosis with ulcer, left leg Plan: I discussed with the patient that based upon the lack of change over the past couple weeks and her toe, I think the most expeditious way to handle this is to proceed with a left second toe amputation.  I did discuss that there is a possibility of this not healing, however her blood flow I feel that this should heal.  She is agreeable to this and we have scheduled that for Thursday, March 17  V. Leia Alf, M.D. [facility] Office: [phone] Pager:  [phone]

## 2014-07-08 DIAGNOSIS — L97522 Non-pressure chronic ulcer of other part of left foot with fat layer exposed: Secondary | ICD-10-CM | POA: Diagnosis not present

## 2014-07-09 DIAGNOSIS — I70222 Atherosclerosis of native arteries of extremities with rest pain, left leg: Secondary | ICD-10-CM | POA: Diagnosis not present

## 2014-07-09 DIAGNOSIS — L97522 Non-pressure chronic ulcer of other part of left foot with fat layer exposed: Secondary | ICD-10-CM | POA: Diagnosis not present

## 2014-07-09 DIAGNOSIS — E11621 Type 2 diabetes mellitus with foot ulcer: Secondary | ICD-10-CM | POA: Diagnosis not present

## 2014-07-09 DIAGNOSIS — E1152 Type 2 diabetes mellitus with diabetic peripheral angiopathy with gangrene: Secondary | ICD-10-CM | POA: Diagnosis not present

## 2014-07-09 LAB — GLUCOSE, CAPILLARY
Glucose-Capillary: 209 mg/dL — ABNORMAL HIGH (ref 70–99)
Glucose-Capillary: 189 mg/dL — ABNORMAL HIGH (ref 70–99)

## 2014-07-10 DIAGNOSIS — I70222 Atherosclerosis of native arteries of extremities with rest pain, left leg: Secondary | ICD-10-CM | POA: Diagnosis not present

## 2014-07-10 DIAGNOSIS — E11621 Type 2 diabetes mellitus with foot ulcer: Secondary | ICD-10-CM | POA: Diagnosis not present

## 2014-07-10 DIAGNOSIS — L97522 Non-pressure chronic ulcer of other part of left foot with fat layer exposed: Secondary | ICD-10-CM | POA: Diagnosis not present

## 2014-07-10 DIAGNOSIS — E1152 Type 2 diabetes mellitus with diabetic peripheral angiopathy with gangrene: Secondary | ICD-10-CM | POA: Diagnosis not present

## 2014-07-10 LAB — GLUCOSE, CAPILLARY
Glucose-Capillary: 222 mg/dL — ABNORMAL HIGH (ref 70–99)
Glucose-Capillary: 192 mg/dL — ABNORMAL HIGH (ref 70–99)

## 2014-07-13 ENCOUNTER — Ambulatory Visit (INDEPENDENT_AMBULATORY_CARE_PROVIDER_SITE_OTHER): Payer: 59 | Admitting: Internal Medicine

## 2014-07-13 ENCOUNTER — Encounter: Payer: Self-pay | Admitting: Internal Medicine

## 2014-07-13 VITALS — BP 120/70 | HR 61 | Temp 97.8°F | Resp 16 | Ht 65.0 in | Wt 172.0 lb

## 2014-07-13 DIAGNOSIS — I70222 Atherosclerosis of native arteries of extremities with rest pain, left leg: Secondary | ICD-10-CM | POA: Diagnosis not present

## 2014-07-13 DIAGNOSIS — E1151 Type 2 diabetes mellitus with diabetic peripheral angiopathy without gangrene: Secondary | ICD-10-CM

## 2014-07-13 DIAGNOSIS — L97522 Non-pressure chronic ulcer of other part of left foot with fat layer exposed: Secondary | ICD-10-CM | POA: Diagnosis not present

## 2014-07-13 DIAGNOSIS — E1152 Type 2 diabetes mellitus with diabetic peripheral angiopathy with gangrene: Secondary | ICD-10-CM | POA: Diagnosis not present

## 2014-07-13 DIAGNOSIS — E11621 Type 2 diabetes mellitus with foot ulcer: Secondary | ICD-10-CM | POA: Diagnosis not present

## 2014-07-13 DIAGNOSIS — I739 Peripheral vascular disease, unspecified: Secondary | ICD-10-CM

## 2014-07-13 DIAGNOSIS — I10 Essential (primary) hypertension: Secondary | ICD-10-CM

## 2014-07-13 LAB — GLUCOSE, CAPILLARY
Glucose-Capillary: 262 mg/dL — ABNORMAL HIGH (ref 70–99)
Glucose-Capillary: 208 mg/dL — ABNORMAL HIGH (ref 70–99)

## 2014-07-13 MED ORDER — CANAGLIFLOZIN 300 MG PO TABS: 300.0000 mg | ORAL_TABLET | Freq: Every day | ORAL | Status: DC

## 2014-07-13 MED ORDER — INSULIN DETEMIR 100 UNIT/ML FLEXPEN: 30.0000 [IU] | PEN_INJECTOR | Freq: Every day | SUBCUTANEOUS | Status: DC

## 2014-07-13 NOTE — Progress Notes (Signed)
Pre visit review using our clinic review tool, if applicable. No additional management support is needed unless otherwise documented below in the visit note. 

## 2014-07-13 NOTE — Patient Instructions (Signed)

## 2014-07-13 NOTE — Progress Notes (Signed)
Subjective:    Patient ID: Theresa Harrington, female    DOB: 01/25/1956, 59 y.o.   MRN: [id number]  HPI Comments: She returns and complains of high blood sugars for 2 weeks.  Diabetes She presents for her follow-up diabetic visit. She has type 2 diabetes mellitus. Her disease course has been fluctuating. There are no hypoglycemic associated symptoms. Pertinent negatives for hypoglycemia include no dizziness. Associated symptoms include foot paresthesias and polyphagia. Pertinent negatives for diabetes include no blurred vision, no chest pain, no fatigue, no foot ulcerations, no polydipsia, no polyuria, no visual change, no weakness and no weight loss. There are no hypoglycemic complications. Diabetic complications include PVD. Current diabetic treatment includes oral agent (dual therapy) and insulin injections. She is compliant with treatment all of the time. She is following a generally unhealthy diet. Meal planning includes avoidance of concentrated sweets. She participates in exercise intermittently. Her home blood glucose trend is increasing steadily. Her breakfast blood glucose range is generally >200 mg/dl. Her lunch blood glucose range is generally >200 mg/dl. Her dinner blood glucose range is generally >200 mg/dl. Her highest blood glucose is >200 mg/dl. Her overall blood glucose range is >200 mg/dl. An ACE inhibitor/angiotensin II receptor blocker is being taken. She does not see a podiatrist.Eye exam is current.      Review of Systems  Constitutional: Negative.  Negative for chills, weight loss, diaphoresis and fatigue.  HENT: Negative.   Eyes: Negative.  Negative for blurred vision.  Respiratory: Negative.  Negative for cough, choking, chest tightness, shortness of breath and stridor.   Cardiovascular: Negative.  Negative for chest pain, palpitations and leg swelling.  Gastrointestinal: Negative.  Negative for nausea, vomiting, abdominal pain, diarrhea and constipation.  Endocrine: Positive  for polyphagia. Negative for polydipsia and polyuria.  Genitourinary: Negative.  Negative for urgency, frequency, decreased urine volume, enuresis and difficulty urinating.  Musculoskeletal: Negative.  Negative for myalgias, back pain, joint swelling and arthralgias.  Skin: Negative.  Negative for rash.  Allergic/Immunologic: Negative.   Neurological: Negative.  Negative for dizziness, weakness, light-headedness and numbness.  Hematological: Negative.  Negative for adenopathy. Does not bruise/bleed easily.  Psychiatric/Behavioral: Negative.        Objective:   Physical Exam  Constitutional: She is oriented to person, place, and time. She appears well-developed and well-nourished. No distress.  HENT:  Head: Normocephalic and atraumatic.  Mouth/Throat: Oropharynx is clear and moist. No oropharyngeal exudate.  Eyes: Conjunctivae are normal. Right eye exhibits no discharge. Left eye exhibits no discharge. No scleral icterus.  Neck: Normal range of motion. Neck supple. No JVD present. No tracheal deviation present. No thyromegaly present.  Cardiovascular: Normal rate, regular rhythm, normal heart sounds and intact distal pulses.  Exam reveals no gallop and no friction rub.   No murmur heard. Pulmonary/Chest: Effort normal and breath sounds normal. No stridor. No respiratory distress. She has no wheezes. She has no rales. She exhibits no tenderness.  Abdominal: Soft. Bowel sounds are normal. She exhibits no distension and no mass. There is no tenderness. There is no rebound and no guarding.  Musculoskeletal: Normal range of motion. She exhibits no edema or tenderness.  Lymphadenopathy:    She has no cervical adenopathy.  Neurological: She is oriented to person, place, and time.  Skin: Skin is warm and dry. No rash noted. She is not diaphoretic. No erythema. No pallor.  Vitals reviewed.   Lab Results  Component Value Date   WBC 7.8 06/16/2014   HGB 9.9* 06/16/2014  HCT 30.8* 06/16/2014     PLT 325 06/16/2014   GLUCOSE 172* 06/16/2014   CHOL 227* 02/05/2014   TRIG 140.0 02/05/2014   HDL 46.40 02/05/2014   LDLDIRECT 160.2 03/01/2010   LDLCALC 153* 02/05/2014   ALT 13 06/14/2014   AST 19 06/14/2014   NA 141 06/16/2014   K 3.5 06/16/2014   CL 108 06/16/2014   CREATININE 0.76 06/16/2014   BUN 7 06/16/2014   CO2 24 06/16/2014   TSH 0.596 06/15/2014   INR 1.03 06/01/2014   HGBA1C 7.2* 06/15/2014   MICROALBUR 3.5* 03/01/2010        Assessment & Plan:

## 2014-07-14 DIAGNOSIS — E11621 Type 2 diabetes mellitus with foot ulcer: Secondary | ICD-10-CM | POA: Diagnosis not present

## 2014-07-14 DIAGNOSIS — I70222 Atherosclerosis of native arteries of extremities with rest pain, left leg: Secondary | ICD-10-CM | POA: Diagnosis not present

## 2014-07-14 DIAGNOSIS — L97522 Non-pressure chronic ulcer of other part of left foot with fat layer exposed: Secondary | ICD-10-CM | POA: Diagnosis not present

## 2014-07-14 DIAGNOSIS — E1152 Type 2 diabetes mellitus with diabetic peripheral angiopathy with gangrene: Secondary | ICD-10-CM | POA: Diagnosis not present

## 2014-07-14 LAB — GLUCOSE, CAPILLARY
Glucose-Capillary: 156 mg/dL — ABNORMAL HIGH (ref 70–99)
Glucose-Capillary: 102 mg/dL — ABNORMAL HIGH (ref 70–99)

## 2014-07-15 DIAGNOSIS — L97522 Non-pressure chronic ulcer of other part of left foot with fat layer exposed: Secondary | ICD-10-CM | POA: Diagnosis not present

## 2014-07-15 LAB — GLUCOSE, CAPILLARY
Glucose-Capillary: 285 mg/dL — ABNORMAL HIGH (ref 70–99)
Glucose-Capillary: 174 mg/dL — ABNORMAL HIGH (ref 70–99)

## 2014-07-15 MED ORDER — VANCOMYCIN HCL IN DEXTROSE 1-5 GM/200ML-% IV SOLN
1000.0000 mg | INTRAVENOUS | Status: AC
  Administered 2014-07-16: 1000 mg via INTRAVENOUS
  Filled 2014-07-15: qty 200

## 2014-07-15 MED ORDER — SODIUM CHLORIDE 0.9 % IV SOLN: INTRAVENOUS | Status: DC

## 2014-07-15 MED ORDER — CHLORHEXIDINE GLUCONATE CLOTH 2 % EX PADS: 6.0000 | MEDICATED_PAD | Freq: Once | CUTANEOUS | Status: DC

## 2014-07-15 NOTE — Assessment & Plan Note (Signed)
During a recent admission invokana was stopped and the levemir dose was lowered Now, since discharge, her blood sugars are consistently over 200 Will restart invokana and will increase her levemir dose I do not want to restart the SU as it had caused some episodes of hypoglycemia Will cont metformin

## 2014-07-15 NOTE — Assessment & Plan Note (Signed)
Her BP is well controlled Lytes and renal function are stable 

## 2014-07-15 NOTE — Assessment & Plan Note (Signed)
She is s/p bypass of LLE ane she tells me that an infected/ischemic toe will be amputated later this week

## 2014-07-15 NOTE — Progress Notes (Signed)
After many unsuccessful attempts to contact pt, I was able to speak with her in the presence of her emergency contact. Due to privacy issues, the pt requested pre-op instructions only. The pt stated that she did stop taking her Plavix as instructed by MD " 5 days before the surgery."  Pt instructions were also left on pt cell phone as requested. Pt verbalized understanding of all pre-op instructions.

## 2014-07-16 ENCOUNTER — Ambulatory Visit (HOSPITAL_COMMUNITY): Payer: 59 | Admitting: Vascular Surgery

## 2014-07-16 ENCOUNTER — Observation Stay (HOSPITAL_COMMUNITY)
Admission: RE | Admit: 2014-07-16 | Discharge: 2014-07-17 | Disposition: A | Discharge status: Home / Self Care | Payer: 59 | Source: Ambulatory Visit | Attending: Surgery | Admitting: Surgery

## 2014-07-16 ENCOUNTER — Encounter (HOSPITAL_COMMUNITY)
Admission: RE | Disposition: A | Discharge status: Home / Self Care | Payer: Self-pay | Source: Ambulatory Visit | Attending: Surgery

## 2014-07-16 ENCOUNTER — Encounter (HOSPITAL_COMMUNITY): Payer: Self-pay | Admitting: *Deleted

## 2014-07-16 DIAGNOSIS — Z7982 Long term (current) use of aspirin: Secondary | ICD-10-CM | POA: Insufficient documentation

## 2014-07-16 DIAGNOSIS — F419 Anxiety disorder, unspecified: Secondary | ICD-10-CM | POA: Diagnosis not present

## 2014-07-16 DIAGNOSIS — F329 Major depressive disorder, single episode, unspecified: Secondary | ICD-10-CM | POA: Diagnosis not present

## 2014-07-16 DIAGNOSIS — Z7902 Long term (current) use of antithrombotics/antiplatelets: Secondary | ICD-10-CM | POA: Diagnosis not present

## 2014-07-16 DIAGNOSIS — Z794 Long term (current) use of insulin: Secondary | ICD-10-CM | POA: Diagnosis not present

## 2014-07-16 DIAGNOSIS — E11622 Type 2 diabetes mellitus with other skin ulcer: Secondary | ICD-10-CM | POA: Diagnosis not present

## 2014-07-16 DIAGNOSIS — Z419 Encounter for procedure for purposes other than remedying health state, unspecified: Secondary | ICD-10-CM

## 2014-07-16 DIAGNOSIS — Z9884 Bariatric surgery status: Secondary | ICD-10-CM | POA: Insufficient documentation

## 2014-07-16 DIAGNOSIS — I10 Essential (primary) hypertension: Secondary | ICD-10-CM | POA: Diagnosis not present

## 2014-07-16 DIAGNOSIS — L97529 Non-pressure chronic ulcer of other part of left foot with unspecified severity: Secondary | ICD-10-CM | POA: Insufficient documentation

## 2014-07-16 DIAGNOSIS — D649 Anemia, unspecified: Secondary | ICD-10-CM | POA: Diagnosis not present

## 2014-07-16 DIAGNOSIS — K219 Gastro-esophageal reflux disease without esophagitis: Secondary | ICD-10-CM | POA: Insufficient documentation

## 2014-07-16 DIAGNOSIS — Z87891 Personal history of nicotine dependence: Secondary | ICD-10-CM | POA: Diagnosis not present

## 2014-07-16 DIAGNOSIS — I700 Atherosclerosis of aorta: Secondary | ICD-10-CM | POA: Insufficient documentation

## 2014-07-16 DIAGNOSIS — E785 Hyperlipidemia, unspecified: Secondary | ICD-10-CM | POA: Diagnosis not present

## 2014-07-16 DIAGNOSIS — E039 Hypothyroidism, unspecified: Secondary | ICD-10-CM | POA: Diagnosis not present

## 2014-07-16 DIAGNOSIS — I998 Other disorder of circulatory system: Secondary | ICD-10-CM | POA: Diagnosis not present

## 2014-07-16 DIAGNOSIS — I70245 Atherosclerosis of native arteries of left leg with ulceration of other part of foot: Secondary | ICD-10-CM | POA: Diagnosis not present

## 2014-07-16 DIAGNOSIS — I96 Gangrene, not elsewhere classified: Secondary | ICD-10-CM | POA: Diagnosis present

## 2014-07-16 DIAGNOSIS — Z79899 Other long term (current) drug therapy: Secondary | ICD-10-CM | POA: Diagnosis not present

## 2014-07-16 HISTORY — DX: Anxiety disorder, unspecified: F41.9

## 2014-07-16 HISTORY — DX: Pneumonia, unspecified organism: J18.9

## 2014-07-16 HISTORY — PX: AMPUTATION: SHX166

## 2014-07-16 LAB — GLUCOSE, CAPILLARY
Glucose-Capillary: 174 mg/dL — ABNORMAL HIGH (ref 70–99)
Glucose-Capillary: 160 mg/dL — ABNORMAL HIGH (ref 70–99)
Glucose-Capillary: 127 mg/dL — ABNORMAL HIGH (ref 70–99)
Glucose-Capillary: 175 mg/dL — ABNORMAL HIGH (ref 70–99)

## 2014-07-16 LAB — CBC
HEMATOCRIT: 38.1 % (ref 36.0–46.0)
HEMATOCRIT: 37 % (ref 36.0–46.0)
HEMOGLOBIN: 12.2 g/dL (ref 12.0–15.0)
Hemoglobin: 12.1 g/dL (ref 12.0–15.0)
MCH: 27.9 pg (ref 26.0–34.0)
MCH: 28.3 pg (ref 26.0–34.0)
MCHC: 32 g/dL (ref 30.0–36.0)
MCHC: 32.7 g/dL (ref 30.0–36.0)
MCV: 87 fL (ref 78.0–100.0)
MCV: 86.4 fL (ref 78.0–100.0)
PLATELETS: 245 10*3/uL (ref 150–400)
PLATELETS: 229 10*3/uL (ref 150–400)
RBC: 4.38 MIL/uL (ref 3.87–5.11)
RBC: 4.28 MIL/uL (ref 3.87–5.11)
RDW: 13.9 % (ref 11.5–15.5)
RDW: 13.9 % (ref 11.5–15.5)
WBC: 8.6 10*3/uL (ref 4.0–10.5)
WBC: 8 10*3/uL (ref 4.0–10.5)

## 2014-07-16 LAB — COMPREHENSIVE METABOLIC PANEL
ALT: 18 U/L (ref 0–35)
ANION GAP: 10 (ref 5–15)
AST: 18 U/L (ref 0–37)
Albumin: 3.7 g/dL (ref 3.5–5.2)
Alkaline Phosphatase: 65 U/L (ref 39–117)
BUN: 13 mg/dL (ref 6–23)
CHLORIDE: 103 mmol/L (ref 96–112)
CO2: 24 mmol/L (ref 19–32)
Calcium: 9.9 mg/dL (ref 8.4–10.5)
Creatinine, Ser: 0.93 mg/dL (ref 0.50–1.10)
GFR calc Af Amer: 78 mL/min — ABNORMAL LOW (ref >90)
GFR, EST NON AFRICAN AMERICAN: 67 mL/min — AB (ref >90)
Glucose, Bld: 161 mg/dL — ABNORMAL HIGH (ref 70–99)
Potassium: 4.7 mmol/L (ref 3.5–5.1)
Sodium: 137 mmol/L (ref 135–145)
Total Bilirubin: 0.6 mg/dL (ref 0.3–1.2)
Total Protein: 7.3 g/dL (ref 6.0–8.3)

## 2014-07-16 LAB — PROTIME-INR
INR: 1.05 (ref 0.00–1.49)
Prothrombin Time: 13.8 seconds (ref 11.6–15.2)

## 2014-07-16 LAB — CREATININE, SERUM
CREATININE: 0.86 mg/dL (ref 0.50–1.10)
GFR calc Af Amer: 85 mL/min — ABNORMAL LOW (ref >90)
GFR, EST NON AFRICAN AMERICAN: 74 mL/min — AB (ref >90)

## 2014-07-16 LAB — SURGICAL PCR SCREEN
MRSA, PCR: NEGATIVE
Staphylococcus aureus: NEGATIVE

## 2014-07-16 LAB — APTT: aPTT: 26 seconds (ref 24–37)

## 2014-07-16 SURGERY — AMPUTATION DIGIT
Anesthesia: Monitor Anesthesia Care | Site: Toe | Laterality: Left

## 2014-07-16 MED ORDER — MUPIROCIN 2 % EX OINT
1.0000 "application " | TOPICAL_OINTMENT | Freq: Once | CUTANEOUS | Status: AC
  Administered 2014-07-16: 1 via TOPICAL

## 2014-07-16 MED ORDER — ARTIFICIAL TEARS OP OINT
TOPICAL_OINTMENT | OPHTHALMIC | Status: AC
  Filled 2014-07-16: qty 3.5

## 2014-07-16 MED ORDER — HYDRALAZINE HCL 20 MG/ML IJ SOLN: 5.0000 mg | INTRAMUSCULAR | Status: DC | PRN

## 2014-07-16 MED ORDER — PROPOFOL 10 MG/ML IV BOLUS
INTRAVENOUS | Status: AC
  Filled 2014-07-16: qty 20

## 2014-07-16 MED ORDER — DOCUSATE SODIUM 100 MG PO CAPS
100.0000 mg | ORAL_CAPSULE | Freq: Every day | ORAL | Status: DC
  Filled 2014-07-16 (×2): qty 1

## 2014-07-16 MED ORDER — DIPHENHYDRAMINE HCL 25 MG PO CAPS
50.0000 mg | ORAL_CAPSULE | Freq: Every day | ORAL | Status: DC | PRN
  Administered 2014-07-16: 50 mg via ORAL
  Filled 2014-07-16: qty 2

## 2014-07-16 MED ORDER — METOPROLOL TARTRATE 1 MG/ML IV SOLN: 2.0000 mg | INTRAVENOUS | Status: DC | PRN

## 2014-07-16 MED ORDER — FENTANYL CITRATE 0.05 MG/ML IJ SOLN
100.0000 ug | Freq: Once | INTRAMUSCULAR | Status: AC
  Administered 2014-07-16: 100 ug via INTRAVENOUS

## 2014-07-16 MED ORDER — LACTATED RINGERS IV SOLN
Freq: Once | INTRAVENOUS | Status: AC
  Administered 2014-07-16: 14:00:00 via INTRAVENOUS

## 2014-07-16 MED ORDER — LABETALOL HCL 5 MG/ML IV SOLN
10.0000 mg | INTRAVENOUS | Status: DC | PRN
  Filled 2014-07-16: qty 4

## 2014-07-16 MED ORDER — ENOXAPARIN SODIUM 40 MG/0.4ML ~~LOC~~ SOLN
40.0000 mg | SUBCUTANEOUS | Status: DC
  Filled 2014-07-16: qty 0.4

## 2014-07-16 MED ORDER — CLOPIDOGREL BISULFATE 75 MG PO TABS
75.0000 mg | ORAL_TABLET | Freq: Every day | ORAL | Status: DC
  Administered 2014-07-17: 75 mg via ORAL
  Filled 2014-07-16 (×2): qty 1

## 2014-07-16 MED ORDER — PANTOPRAZOLE SODIUM 40 MG PO TBEC
40.0000 mg | DELAYED_RELEASE_TABLET | Freq: Every day | ORAL | Status: DC
  Administered 2014-07-16 – 2014-07-17 (×2): 40 mg via ORAL
  Filled 2014-07-16 (×2): qty 1

## 2014-07-16 MED ORDER — MIDAZOLAM HCL 2 MG/2ML IJ SOLN
INTRAMUSCULAR | Status: AC
  Filled 2014-07-16: qty 2

## 2014-07-16 MED ORDER — FENTANYL CITRATE 0.05 MG/ML IJ SOLN
INTRAMUSCULAR | Status: AC
  Administered 2014-07-16: 100 ug via INTRAVENOUS
  Filled 2014-07-16: qty 2

## 2014-07-16 MED ORDER — GABAPENTIN 300 MG PO CAPS
300.0000 mg | ORAL_CAPSULE | Freq: Two times a day (BID) | ORAL | Status: DC
  Administered 2014-07-16 – 2014-07-17 (×2): 300 mg via ORAL
  Filled 2014-07-16 (×4): qty 1

## 2014-07-16 MED ORDER — PROPOFOL 10 MG/ML IV BOLUS
INTRAVENOUS | Status: DC | PRN
Start: 2014-07-16 — End: 2014-07-16
  Administered 2014-07-16 (×7): 10 mg via INTRAVENOUS
  Administered 2014-07-16: 35 mg via INTRAVENOUS
  Administered 2014-07-16 (×3): 10 mg via INTRAVENOUS
  Administered 2014-07-16: 25 mg via INTRAVENOUS

## 2014-07-16 MED ORDER — EZETIMIBE 10 MG PO TABS
10.0000 mg | ORAL_TABLET | Freq: Every day | ORAL | Status: DC
  Administered 2014-07-16 – 2014-07-17 (×2): 10 mg via ORAL
  Filled 2014-07-16 (×3): qty 1

## 2014-07-16 MED ORDER — GUAIFENESIN-DM 100-10 MG/5ML PO SYRP: 15.0000 mL | ORAL_SOLUTION | ORAL | Status: DC | PRN

## 2014-07-16 MED ORDER — ALUM & MAG HYDROXIDE-SIMETH 200-200-20 MG/5ML PO SUSP: 15.0000 mL | ORAL | Status: DC | PRN

## 2014-07-16 MED ORDER — FENTANYL CITRATE 0.05 MG/ML IJ SOLN
INTRAMUSCULAR | Status: AC
  Filled 2014-07-16: qty 5

## 2014-07-16 MED ORDER — CANAGLIFLOZIN 300 MG PO TABS
300.0000 mg | ORAL_TABLET | Freq: Every day | ORAL | Status: DC
Start: 2014-07-17 — End: 2014-07-17
  Administered 2014-07-17: 300 mg via ORAL
  Filled 2014-07-16 (×2): qty 300

## 2014-07-16 MED ORDER — ACETAMINOPHEN 650 MG RE SUPP: 325.0000 mg | RECTAL | Status: DC | PRN

## 2014-07-16 MED ORDER — ACETAMINOPHEN 325 MG PO TABS: 325.0000 mg | ORAL_TABLET | ORAL | Status: DC | PRN

## 2014-07-16 MED ORDER — PHENOL 1.4 % MT LIQD
1.0000 | OROMUCOSAL | Status: DC | PRN
  Filled 2014-07-16: qty 177

## 2014-07-16 MED ORDER — LIDOCAINE HCL (CARDIAC) 20 MG/ML IV SOLN
INTRAVENOUS | Status: DC | PRN
  Administered 2014-07-16: 40 mg via INTRAVENOUS

## 2014-07-16 MED ORDER — INSULIN DETEMIR 100 UNIT/ML ~~LOC~~ SOLN
30.0000 [IU] | Freq: Every day | SUBCUTANEOUS | Status: DC
  Administered 2014-07-16: 30 [IU] via SUBCUTANEOUS
  Filled 2014-07-16 (×2): qty 0.3

## 2014-07-16 MED ORDER — BISACODYL 10 MG RE SUPP: 10.0000 mg | Freq: Every day | RECTAL | Status: DC | PRN

## 2014-07-16 MED ORDER — LIDOCAINE HCL (CARDIAC) 20 MG/ML IV SOLN
INTRAVENOUS | Status: AC
  Filled 2014-07-16: qty 5

## 2014-07-16 MED ORDER — OXYCODONE-ACETAMINOPHEN 5-325 MG PO TABS: 1.0000 | ORAL_TABLET | ORAL | Status: DC | PRN

## 2014-07-16 MED ORDER — MUPIROCIN 2 % EX OINT
TOPICAL_OINTMENT | CUTANEOUS | Status: AC
  Administered 2014-07-16: 1 via TOPICAL
  Filled 2014-07-16: qty 22

## 2014-07-16 MED ORDER — INSULIN DETEMIR 100 UNIT/ML FLEXPEN: 30.0000 [IU] | PEN_INJECTOR | Freq: Every day | SUBCUTANEOUS | Status: DC

## 2014-07-16 MED ORDER — METFORMIN HCL 500 MG PO TABS
500.0000 mg | ORAL_TABLET | Freq: Two times a day (BID) | ORAL | Status: DC
  Administered 2014-07-17: 500 mg via ORAL
  Filled 2014-07-16 (×3): qty 1

## 2014-07-16 MED ORDER — MORPHINE SULFATE 2 MG/ML IJ SOLN
2.0000 mg | INTRAMUSCULAR | Status: DC | PRN
  Administered 2014-07-16 – 2014-07-17 (×4): 2 mg via INTRAVENOUS
  Filled 2014-07-16 (×4): qty 1

## 2014-07-16 MED ORDER — VANCOMYCIN HCL IN DEXTROSE 1-5 GM/200ML-% IV SOLN
1000.0000 mg | Freq: Two times a day (BID) | INTRAVENOUS | Status: DC
  Administered 2014-07-17: 1000 mg via INTRAVENOUS
  Filled 2014-07-16 (×2): qty 200

## 2014-07-16 MED ORDER — LACTATED RINGERS IV SOLN
INTRAVENOUS | Status: DC | PRN
  Administered 2014-07-16: 14:00:00 via INTRAVENOUS

## 2014-07-16 MED ORDER — HYDROMORPHONE HCL 1 MG/ML IJ SOLN: 0.2500 mg | INTRAMUSCULAR | Status: DC | PRN

## 2014-07-16 MED ORDER — SODIUM CHLORIDE 0.9 % IV SOLN: INTRAVENOUS | Status: DC

## 2014-07-16 MED ORDER — ONDANSETRON HCL 4 MG/2ML IJ SOLN: 4.0000 mg | Freq: Once | INTRAMUSCULAR | Status: DC | PRN

## 2014-07-16 MED ORDER — 0.9 % SODIUM CHLORIDE (POUR BTL) OPTIME
TOPICAL | Status: DC | PRN
  Administered 2014-07-16: 1000 mL

## 2014-07-16 MED ORDER — MAGNESIUM SULFATE 2 GM/50ML IV SOLN
2.0000 g | Freq: Every day | INTRAVENOUS | Status: DC | PRN
  Filled 2014-07-16: qty 50

## 2014-07-16 MED ORDER — ONDANSETRON HCL 4 MG/2ML IJ SOLN
INTRAMUSCULAR | Status: AC
  Filled 2014-07-16: qty 2

## 2014-07-16 MED ORDER — ONDANSETRON HCL 4 MG/2ML IJ SOLN: 4.0000 mg | Freq: Four times a day (QID) | INTRAMUSCULAR | Status: DC | PRN

## 2014-07-16 MED ORDER — SODIUM CHLORIDE 0.9 % IV SOLN: 500.0000 mL | Freq: Once | INTRAVENOUS | Status: AC | PRN

## 2014-07-16 MED ORDER — POTASSIUM CHLORIDE CRYS ER 20 MEQ PO TBCR
20.0000 meq | EXTENDED_RELEASE_TABLET | Freq: Every day | ORAL | Status: DC | PRN
  Filled 2014-07-16: qty 1

## 2014-07-16 MED ORDER — ASPIRIN EC 325 MG PO TBEC
325.0000 mg | DELAYED_RELEASE_TABLET | Freq: Every day | ORAL | Status: DC
Start: 2014-07-17 — End: 2014-07-17
  Administered 2014-07-17: 325 mg via ORAL
  Filled 2014-07-16 (×2): qty 1

## 2014-07-16 SURGICAL SUPPLY — 34 items
BANDAGE ELASTIC 4 VELCRO ST LF (GAUZE/BANDAGES/DRESSINGS) ×3 IMPLANT
BLADE AVERAGE 25MMX9MM (BLADE)
BLADE AVERAGE 25X9 (BLADE) IMPLANT
BLADE SAW RECIP 87.9 MT (BLADE) IMPLANT
BNDG CONFORM 3 STRL LF (GAUZE/BANDAGES/DRESSINGS) IMPLANT
BNDG GAUZE ELAST 4 BULKY (GAUZE/BANDAGES/DRESSINGS) ×3 IMPLANT
CANISTER SUCTION 2500CC (MISCELLANEOUS) ×3 IMPLANT
DRAPE EXTREMITY T 121X128X90 (DRAPE) ×3 IMPLANT
ELECT REM PT RETURN 9FT ADLT (ELECTROSURGICAL) ×3
ELECTRODE REM PT RTRN 9FT ADLT (ELECTROSURGICAL) ×1 IMPLANT
GAUZE SPONGE 4X4 12PLY STRL (GAUZE/BANDAGES/DRESSINGS) ×3 IMPLANT
GLOVE BIOGEL PI IND STRL 6.5 (GLOVE) IMPLANT
GLOVE BIOGEL PI IND STRL 7.5 (GLOVE) ×1 IMPLANT
GLOVE BIOGEL PI INDICATOR 6.5 (GLOVE) ×4
GLOVE BIOGEL PI INDICATOR 7.5 (GLOVE) ×2
GLOVE SURG SS PI 7.5 STRL IVOR (GLOVE) ×3 IMPLANT
GOWN STRL REUS W/ TWL LRG LVL3 (GOWN DISPOSABLE) ×2 IMPLANT
GOWN STRL REUS W/ TWL XL LVL3 (GOWN DISPOSABLE) ×1 IMPLANT
GOWN STRL REUS W/TWL LRG LVL3 (GOWN DISPOSABLE) ×6
GOWN STRL REUS W/TWL XL LVL3 (GOWN DISPOSABLE) ×3
KIT BASIN OR (CUSTOM PROCEDURE TRAY) ×3 IMPLANT
KIT ROOM TURNOVER OR (KITS) ×3 IMPLANT
NDL HYPO 25GX1X1/2 BEV (NEEDLE) IMPLANT
NEEDLE HYPO 25GX1X1/2 BEV (NEEDLE) IMPLANT
NS IRRIG 1000ML POUR BTL (IV SOLUTION) ×3 IMPLANT
PACK GENERAL/GYN (CUSTOM PROCEDURE TRAY) ×3 IMPLANT
PAD ARMBOARD 7.5X6 YLW CONV (MISCELLANEOUS) ×6 IMPLANT
SPECIMEN JAR SMALL (MISCELLANEOUS) ×3 IMPLANT
SPONGE GAUZE 4X4 12PLY STER LF (GAUZE/BANDAGES/DRESSINGS) ×2 IMPLANT
SUT ETHILON 3 0 PS 1 (SUTURE) ×3 IMPLANT
SYR CONTROL 10ML LL (SYRINGE) IMPLANT
TUBE ANAEROBIC SPECIMEN COL (MISCELLANEOUS) IMPLANT
UNDERPAD 30X30 INCONTINENT (UNDERPADS AND DIAPERS) ×3 IMPLANT
WATER STERILE IRR 1000ML POUR (IV SOLUTION) ×3 IMPLANT

## 2014-07-16 NOTE — Progress Notes (Signed)
Pt arrived from the PACU via bed at 1550 with IV intact to right forearm infusing. Pt A&O x4; very emotional and tearful, when asked why she's crying pt replies to the RN "Don't ask me that stupid question; don't ask me that stupid question"!! Emotional support offered to pt; VSS; telemetry applied and verified; Compression dressing to LLE clean, dry and intact, no active bleeding or drainage noted; pt oriented to the room and call light; family at bedside; pt denies any questions. Will continue to monitor quietly. Call light within reach. Francis Gaines Letrell Attwood RN.

## 2014-07-16 NOTE — Anesthesia Procedure Notes (Addendum)
Anesthesia Regional Block:  Ankle block  Pre-Anesthetic Checklist: ,, timeout performed, Correct Patient, Correct Site, Correct Laterality, Correct Procedure, Correct Position, site marked, Risks and benefits discussed,  Surgical consent,  Pre-op evaluation,  At surgeon's request and post-op pain management  Laterality: Left  Prep: Maximum Sterile Barrier Precautions used, chloraprep and alcohol swabs       Needles:  Injection technique: Single-shot      Needle Gauge: 25 and 25 G    Additional Needles: Ankle block Narrative:  Start time: 07/16/2014 2:00 PM End time: 07/16/2014 2:05 PM Injection made incrementally with aspirations every 5 mL.  Performed by: Personally  Anesthesiologist: Kate Sable  Additional Notes: Pt accepts procedure w/ risks. Ant and Post Tibial nerve block w/ 25cc o.5% Marcaine w/o epi. GES     Procedure Name: MAC Date/Time: 07/16/2014 2:17 PM Performed by: Barrington Ellison Pre-anesthesia Checklist: Patient identified, Emergency Drugs available, Suction available, Patient being monitored and Timeout performed Patient Re-evaluated:Patient Re-evaluated prior to inductionOxygen Delivery Method: Simple face mask Preoxygenation: Pre-oxygenation with 100% oxygen

## 2014-07-16 NOTE — Anesthesia Preprocedure Evaluation (Addendum)
Anesthesia Evaluation  Patient identified by MRN, date of birth, ID band Patient awake    Reviewed: Allergy & Precautions, NPO status , Patient's Chart, lab work & pertinent test results  History of Anesthesia Complications (+) PONV  Airway Mallampati: II  TM Distance: >3 FB Neck ROM: Full    Dental  (+) Teeth Intact, Dental Advisory Given   Pulmonary former smoker,          Cardiovascular hypertension, + Peripheral Vascular Disease     Neuro/Psych Anxiety Depression    GI/Hepatic GERD-  ,  Endo/Other  diabetes, Type 2Hypothyroidism   Renal/GU Renal disease     Musculoskeletal   Abdominal   Peds  Hematology  (+) anemia ,   Anesthesia Other Findings   Reproductive/Obstetrics                            Anesthesia Physical Anesthesia Plan  ASA: III  Anesthesia Plan: MAC and Regional   Post-op Pain Management:    Induction: Intravenous  Airway Management Planned: Simple Face Mask  Additional Equipment:   Intra-op Plan:   Post-operative Plan:   Informed Consent: I have reviewed the patients History and Physical, chart, labs and discussed the procedure including the risks, benefits and alternatives for the proposed anesthesia with the patient or authorized representative who has indicated his/her understanding and acceptance.     Plan Discussed with: CRNA, Anesthesiologist and Surgeon  Anesthesia Plan Comments:         Anesthesia Quick Evaluation

## 2014-07-16 NOTE — Progress Notes (Signed)
Receiving report from [facility]

## 2014-07-16 NOTE — H&P (View-Only) (Signed)
Patient name: Theresa Harrington MRN: [id number] DOB: 02-04-1956 Sex: female     Chief Complaint  Patient presents with  . Re-evaluation    c/o Lt LE discoloration - s/p L fem pop BP     HISTORY OF PRESENT ILLNESS: The patient presented in February with swelling and redness of her left foot. She had her toenail removed earlier. She was admitted for IV antibiotics. She initially underwent left femoral-popliteal bypass graft by Dr. early on 02/06/2015. Unfortunately this occluded early. I took her back to the operating room on 06/09/2014 and redid her operation with Gore-Tex, 6 mm propatent. She was ultimately discharged from the hospital on IV antibiotics.  I sent her to the wound center for hyperbaric oxygen to see if we can get this to heal, however there is issues with insurance approval.  Past Medical History  Diagnosis Date  . Hyperlipidemia   . Hypertension   . PONV (postoperative nausea and vomiting)   . Right ureteral stone   . PAD (peripheral artery disease)   . Atherosclerosis of aorta   . GERD (gastroesophageal reflux disease)   . Asymptomatic cholelithiasis   . Peripheral arterial occlusive disease     lower extremities  . Wears glasses   . Hypothyroidism     no meds  . Kidney stones   . Type 2 diabetes mellitus   . Anemia     Past Surgical History  Procedure Laterality Date  . Belpharoptosis repair      eyelid lift  . Cystoscopy with stent placement Right 06/04/2013    Procedure: CYSTOSCOPY WITH STENT PLACEMENT;  Surgeon: Franchot Gallo, MD;  Location: [facility];  Service: Urology;  Laterality: Right;  . Right knee patellectomy w/ repair of extensor mechanism  04-14-2002  . Orif fifth metacarpal fx  right hand  04-21-2002  . Laparoscopic gastric banding  05-29-2005  . Revision and re-siting lap-band port  04-08-2010    W/  UPPER EGD  . Combined augmentation mammaplasty and abdominoplasty  2009    W/  BILATERAL  THIGH LIFT  . Cysto/  right ureteral stent  placement  12-30-2010  . Extracorporeal shock wave lithotripsy Right 08-04-2013//   06-23-2013//   01-16-2011  . Cystoscopy with retrograde pyelogram, ureteroscopy and stent placement Right 10/07/2013    Procedure: CYSTOSCOPY WITH RETROGRADE PYELOGRAM, right URETEROSCOPY AND STENT PLACEMENT, stone extraction;  Surgeon: Arvil Persons, MD;  Location: [facility];  Service: Urology;  Laterality: Right;  . Holmium laser application Right 09/01/6268    Procedure: HOLMIUM LASER APPLICATION;  Surgeon: Arvil Persons, MD;  Location: [facility];  Service: Urology;  Laterality: Right;  . Kidney stone surgery  April 2015    1-2 stones  . Toenail removed Left Jan. 21, 2016    2nd toenail-  Dr. Barkley Bruns  . Augmentation mammaplasty    . Lithotripsy  2-3 times  . Lower extremity angiogram N/A 06/04/2014    Procedure: LOWER EXTREMITY ANGIOGRAM;  Surgeon: Serafina Mitchell, MD;  Location: [facility] CATH LAB;  Service: Cardiovascular;  Laterality: N/A;  . Endarterectomy femoral Left 06/08/2014    Procedure: Left Leg Common Femoral and External Iliac  Endartarectomy with patch Angioplasty;  Surgeon: Rosetta Posner, MD;  Location: [place];  Service: Vascular;  Laterality: Left;  . Femoral-popliteal bypass graft Left 06/08/2014    Procedure: Left Leg Femoral -Popliteal Bypass Graft;  Surgeon: Rosetta Posner, MD;  Location: [place];  Service: Vascular;  Laterality:  Left;  . Femoral-popliteal bypass graft Left 06/09/2014    Procedure: Left Femoral and Popliteal Exposure; Left Femoral to Anterior Tibial Bypass Graft using Propaten 44mm by 80cm Goretex Graft; Left Tibial Endarterectomy; Left Femoraland Popliteal Thrombectomy ;  Surgeon: Serafina Mitchell, MD;  Location: [place];  Service: Vascular;  Laterality: Left;    History   Social History  . Marital Status: Single    Spouse Name: N/A  . Number of Children: N/A  . Years of Education: N/A   Occupational History  . tester    Social History Main Topics  . Smoking  status: Former Smoker -- 1.00 packs/day for 28 years    Types: Cigarettes    Quit date: 09/05/2001  . Smokeless tobacco: Never Used  . Alcohol Use: No  . Drug Use: No  . Sexual Activity: Yes   Other Topics Concern  . Not on file   Social History Narrative   Regular exercise- yes    Family History  Problem Relation Age of Onset  . Arthritis Other   . Cancer Other     colon  . Hypertension Other   . Stroke Other   . Diabetes Father   . Heart disease Father   . Deep vein thrombosis Father   . Hyperlipidemia Father   . Diabetes Sister   . Heart disease Sister   . Deep vein thrombosis Sister   . Hyperlipidemia Sister   . Diabetes Brother   . Heart disease Brother   . Cancer Brother   . Hyperlipidemia Brother   . Colon cancer Maternal Aunt   . Diabetes Brother   . Diabetes Brother   . Kidney disease Mother   . Hyperlipidemia Mother   . Other Mother     AAA   and    Amputation    Allergies as of 07/06/2014 - Review Complete 07/06/2014  Allergen Reaction Noted  . Amlodipine Other (See Comments) 11/26/2013  . Atorvastatin Other (See Comments) 10/28/2013  . Ampicillin Nausea And Vomiting 04/12/2011  . Codeine Nausea And Vomiting   . Lisinopril Other (See Comments) 10/05/2008  . Penicillins Nausea And Vomiting 05/01/2011  . Januvia [sitagliptin]  12/21/2011    Current Outpatient Prescriptions on File Prior to Visit  Medication Sig Dispense Refill  . aspirin EC 325 MG tablet Take 325 mg by mouth daily.    . clopidogrel (PLAVIX) 75 MG tablet Take 1 tablet (75 mg total) by mouth daily. 30 tablet 3  . ezetimibe (ZETIA) 10 MG tablet Take 1 tablet (10 mg total) by mouth daily. (Patient taking differently: Take 10 mg by mouth at bedtime. ) 90 tablet 3  . Ferrous Sulfate (IRON) 325 (65 FE) MG TABS Take 325 mg by mouth daily.     [name] gabapentin (NEURONTIN) 300 MG capsule Take 1 capsule (300 mg total) by mouth 3 (three) times daily. (Patient taking differently: Take 300 mg by  mouth 3 (three) times daily. Taking different. Take twice daily) 90 capsule 11  . Insulin Detemir (LEVEMIR FLEXTOUCH) 100 UNIT/ML Pen Inject 15 Units into the skin daily at 10 pm. DX 250.72 15 mL 11  . Insulin Pen Needle (NOVOFINE PLUS) 32G X 4 MM MISC Use to inject once a day Dx 250.00 100 each 3  . levofloxacin (LEVAQUIN) 500 MG tablet Take 1 tablet (500 mg total) by mouth daily. (Patient taking differently: Take 500 mg by mouth daily. 15 day course started 06/14/14) 15 tablet 0  . Magnesium Oxide 400 MG CAPS  Take 1 capsule (400 mg total) by mouth daily. 90 capsule 3  . metFORMIN (GLUCOPHAGE) 500 MG tablet Take 1 tablet (500 mg total) by mouth 2 (two) times daily. 60 tablet 0  . oxyCODONE (OXY IR/ROXICODONE) 5 MG immediate release tablet Take 1-2 tablets (5-10 mg total) by mouth every 6 (six) hours as needed for severe pain. 30 tablet 0  . pantoprazole (PROTONIX) 40 MG tablet Take 40 mg by mouth daily.     . pantoprazole (PROTONIX) 40 MG tablet TAKE ONE TABLET BY MOUTH ONCE DAILY 90 tablet 3  . Pitavastatin Calcium (LIVALO) 2 MG TABS Take 1 tablet (2 mg total) by mouth daily. (Patient taking differently: Take 2 mg by mouth at bedtime. ) 30 tablet 11  . sulfamethoxazole-trimethoprim (BACTRIM DS,SEPTRA DS) 800-160 MG per tablet Take 1 tablet by mouth 2 (two) times daily. 28 tablet 0   No current facility-administered medications on file prior to visit.       PHYSICAL EXAMINATION:   Vital signs are BP 121/59 mmHg  Pulse 74  Temp(Src) 98 F (36.7 C) (Oral)  Ht 5\' 5"  (1.651 m)  Wt 173 lb 12.8 oz (78.835 kg)  BMI 28.92 kg/m2  SpO2 99% General: The patient appears their stated age. HEENT:  No gross abnormalities Pulmonary:  Non labored breathing Musculoskeletal: There are no major deformities. Neurologic: No focal weakness or paresthesias are detected, Skin: Necrotic tip to the left second toe with edema and erythema extending to the base of the toe as well as a linear crack between the  second and third toe Psychiatric: The patient has normal affect. Cardiovascular: Significant left leg swelling.  I was able to get a brisk posterior tibial and dorsalis pedis Doppler signal.  I traced the signal all the way out to the base of the toes with a good signal   Diagnostic Studies None  Assessment: Atherosclerosis with ulcer, left leg Plan: I discussed with the patient that based upon the lack of change over the past couple weeks and her toe, I think the most expeditious way to handle this is to proceed with a left second toe amputation.  I did discuss that there is a possibility of this not healing, however her blood flow I feel that this should heal.  She is agreeable to this and we have scheduled that for Thursday, March 17  V. Leia Alf, M.D. [facility] [place] Office: [phone] Pager:  [phone]

## 2014-07-16 NOTE — Anesthesia Postprocedure Evaluation (Signed)
  Anesthesia Post-op Note  Patient: Theresa Harrington  Procedure(s) Performed: Procedure(s) with comments: LEFT SECOND TOE AMPUTATION (Left) - With Nerve block  Patient Location: PACU  Anesthesia Type:General  Level of Consciousness: awake and alert   Airway and Oxygen Therapy: Patient Spontanous Breathing  Post-op Pain: mild  Post-op Assessment: Post-op Vital signs reviewed and Patient's Cardiovascular Status Stable  Post-op Vital Signs: Reviewed and stable  Last Vitals:  Filed Vitals:   07/16/14 1515  BP: 171/70  Pulse: 49  Temp:   Resp: 13    Complications: No apparent anesthesia complications

## 2014-07-16 NOTE — Op Note (Signed)
    Patient name: Theresa Harrington MRN: [id number] DOB: 04-04-1956 Sex: female  07/16/2014 Pre-operative Diagnosis: ischemic left second toe Post-operative diagnosis:  Same Surgeon:  Eldridge Abrahams Assistants:  none Procedure:   Amputation left second toe with metatarsal head Anesthesia:  block Blood Loss:  See anesthesia record Specimens:  Left toe for culture  Findings:  Good bleeding  Indications:  The patient has undergone femoral-popliteal bypass grafting for a wound on her left second toe.  This has not healed and there are gangrenous changes.  We have decided to proceed with amputation.  Procedure:  The patient was identified in the holding area and taken to [place] 16  The patient was then placed supine on the table. Ankle block anesthesia was administered.  The patient was prepped and draped in the usual sterile fashion.  A time out was called and antibiotics were administered.  A fishmouth type incision was made at the base of the left second toe, including the ulcer between the second and third toe.  All necrotic tissue was sharply removed.  The incision was taken down to the bone which was then transected.  There was excellent bleeding.  Stann Main[place] were used to debride back to the metatarsal head which was also resected.  Hemostasis was achieved.  The wound bed looked very healthy.  It was closed with interrupted 3-0 nylon suture.  Sterile dressings were applied.   Disposition:  To PACU in stable condition.   Theotis Burrow, M.D. [facility] [place] Office: [phone] Pager:  [phone]

## 2014-07-16 NOTE — Transfer of Care (Signed)
Immediate Anesthesia Transfer of Care Note  Patient: Theresa Harrington  Procedure(s) Performed: Procedure(s) with comments: LEFT SECOND TOE AMPUTATION (Left) - With Nerve block  Patient Location: PACU  Anesthesia Type:MAC  Level of Consciousness: awake, alert  and oriented  Airway & Oxygen Therapy: Patient Spontanous Breathing  Post-op Assessment: Report given to RN  Post vital signs: Reviewed and stable  Last Vitals:  Filed Vitals:   07/16/14 1405  BP: 192/76  Pulse: 54  Temp:   Resp: 12    Complications: No apparent anesthesia complications

## 2014-07-16 NOTE — Interval H&P Note (Signed)
History and Physical Interval Note:  07/16/2014 1:25 PM  Theresa Harrington  has presented today for surgery, with the diagnosis of Peripheral vascular disease with left toe ulcer I70.245  The various methods of treatment have been discussed with the patient and family. After consideration of risks, benefits and other options for treatment, the patient has consented to  Procedure(s): SECOND TOE AMPUTATION (Left) as a surgical intervention .  The patient's history has been reviewed, patient examined, no change in status, stable for surgery.  I have reviewed the patient's chart and labs.  Questions were answered to the patient's satisfaction.     [name] IV, V. [facility]

## 2014-07-16 NOTE — Progress Notes (Signed)
Called Dr. Tresa Moore for signout

## 2014-07-16 NOTE — Progress Notes (Signed)
Theresa Harrington was assisted to the BR and upon returning to bed; Harrington foot dressing was noted to be bleeding; Charge nurse Baxter Flattery was called in to assist; PA Thrin was called and she ordered to remove dsg and apply pressure to the site and apply new dsg when site stop bleeding; and she will come to see Harrington later; pressure was applied and new dsg applied. Not active bleeding was noted. Dopplers to LLE; LLE remains nonpitting edema; PA Thrin came to assess and change Harrington dsg; reported off to incoming RN. Francis Gaines Armondo Cech RN.

## 2014-07-17 ENCOUNTER — Encounter: Payer: Self-pay | Admitting: Surgery

## 2014-07-17 ENCOUNTER — Encounter (HOSPITAL_COMMUNITY): Payer: Self-pay | Admitting: Surgery

## 2014-07-17 ENCOUNTER — Telehealth: Payer: Self-pay | Admitting: Surgery

## 2014-07-17 DIAGNOSIS — I70245 Atherosclerosis of native arteries of left leg with ulceration of other part of foot: Secondary | ICD-10-CM | POA: Diagnosis not present

## 2014-07-17 LAB — CBC
HCT: 33.6 % — ABNORMAL LOW (ref 36.0–46.0)
HEMOGLOBIN: 11 g/dL — AB (ref 12.0–15.0)
MCH: 28.6 pg (ref 26.0–34.0)
MCHC: 32.7 g/dL (ref 30.0–36.0)
MCV: 87.3 fL (ref 78.0–100.0)
Platelets: 219 10*3/uL (ref 150–400)
RBC: 3.85 MIL/uL — ABNORMAL LOW (ref 3.87–5.11)
RDW: 14 % (ref 11.5–15.5)
WBC: 8.5 10*3/uL (ref 4.0–10.5)

## 2014-07-17 LAB — GLUCOSE, CAPILLARY
GLUCOSE-CAPILLARY: 175 mg/dL — AB (ref 70–99)
GLUCOSE-CAPILLARY: 220 mg/dL — AB (ref 70–99)

## 2014-07-17 LAB — BASIC METABOLIC PANEL
ANION GAP: 10 (ref 5–15)
BUN: 14 mg/dL (ref 6–23)
CHLORIDE: 103 mmol/L (ref 96–112)
CO2: 23 mmol/L (ref 19–32)
Calcium: 9.1 mg/dL (ref 8.4–10.5)
Creatinine, Ser: 0.91 mg/dL (ref 0.50–1.10)
GFR calc Af Amer: 80 mL/min — ABNORMAL LOW (ref >90)
GFR calc non Af Amer: 69 mL/min — ABNORMAL LOW (ref >90)
Glucose, Bld: 224 mg/dL — ABNORMAL HIGH (ref 70–99)
POTASSIUM: 4.5 mmol/L (ref 3.5–5.1)
Sodium: 136 mmol/L (ref 135–145)

## 2014-07-17 MED ORDER — OXYCODONE HCL 5 MG PO TABS: 5.0000 mg | ORAL_TABLET | Freq: Four times a day (QID) | ORAL | Status: DC | PRN

## 2014-07-17 NOTE — Telephone Encounter (Signed)
-----   Message from Mena Goes, RN sent at 07/16/2014  4:40 PM EDT ----- Regarding: Schedule   ----- Message -----    From: Serafina Mitchell, MD    Sent: 07/16/2014   3:00 PM      To: [facility] Charge Pool  07/16/2014:  Surgeon:  Eldridge Abrahams Assistants:  none Procedure:   Amputation left second toe with metatarsal head   Have patient follow-up on Monday for a wound check

## 2014-07-17 NOTE — Progress Notes (Signed)
Discharge instructions given. Pt verbalized understanding and all questions were answered.  

## 2014-07-17 NOTE — Progress Notes (Signed)
  [facility] Progress Note  07/17/2014 9:03 AM 1 Day Post-Op  Subjective:  Doing well.   Filed Vitals:   07/17/14 0420  BP: 118/52  Pulse: 53  Temp: 97.9 F (36.6 C)  Resp: 17    Physical Exam: 2nd left toe sutures intact. Dried blood at incision. No active bleeding. Skin around incision viable.   CBC    Component Value Date/Time   WBC 8.5 07/17/2014 0118   RBC 3.85* 07/17/2014 0118   HGB 11.0* 07/17/2014 0118   HCT 33.6* 07/17/2014 0118   PLT 219 07/17/2014 0118   MCV 87.3 07/17/2014 0118   MCH 28.6 07/17/2014 0118   MCHC 32.7 07/17/2014 0118   RDW 14.0 07/17/2014 0118   LYMPHSABS 2.6 06/14/2014 2204   MONOABS 0.7 06/14/2014 2204   EOSABS 0.2 06/14/2014 2204   BASOSABS 0.0 06/14/2014 2204    BMET    Component Value Date/Time   NA 136 07/17/2014 0118   K 4.5 07/17/2014 0118   CL 103 07/17/2014 0118   CO2 23 07/17/2014 0118   GLUCOSE 224* 07/17/2014 0118   BUN 14 07/17/2014 0118   CREATININE 0.91 07/17/2014 0118   CALCIUM 9.1 07/17/2014 0118   GFRNONAA 69* 07/17/2014 0118   GFRAA 80* 07/17/2014 0118    INR    Component Value Date/Time   INR 1.05 07/16/2014 1327     Intake/Output Summary (Last 24 hours) at 07/17/14 0903 Last data filed at 07/17/14 0310  Gross per 24 hour  Intake    870 ml  Output    200 ml  Net    670 ml     Assessment:  59 y.o. female is s/p: amputation of left second toe 1 Day Post-Op  Plan: -Had episode of bleeding yesterday. Pressure applied and bleeding stopped. Dressing changed this am due to drainage. Incision without active bleeding. Skin edges viable.  -Instructed patient to ambulate in [place] shoe weightbearing heel only.  -She will keep the dressing intact until follow up with Dr. Trula Slade on Monday 07/20/14.  -Discharge home today.    Virgina Jock, PA-C [facility] Office: [phone] Pager: [phone] 07/17/2014 9:03 AM

## 2014-07-17 NOTE — Progress Notes (Signed)
Inpatient Diabetes Program Recommendations  AACE/ADA: New Consensus Statement on Inpatient Glycemic Control (2013)  Target Ranges:  Prepandial:   less than 140 mg/dL      Peak postprandial:   less than 180 mg/dL (1-2 hours)      Critically ill patients:  140 - 180 mg/dL   Results for Theresa Harrington, Theresa Harrington (MRN [id number]) as of 07/17/2014 11:46  Ref. Range 07/16/2014 12:05 07/16/2014 15:05 07/16/2014 16:50 07/16/2014 21:21 07/17/2014 05:47 07/17/2014 11:24  Glucose-Capillary Latest Range: 70-99 mg/dL 174 (H) 160 (H) 127 (H) 175 (H) 175 (H) 220 (H)   Diabetes history: DM2 Outpatient Diabetes medications: Levemir 30 units QHS, Metformin 500 mg BID, Invokana 300 mg QAM Current orders for Inpatient glycemic control: Levemir 30 units QHS, Metformin 500 mg BID, Invokana 300 mg QAM  Inpatient Diabetes Program Recommendations Correction (SSI): If patient is not discharged as planned, please consider ordering CBGs with Novolog correction scale.  Thanks, Barnie Alderman, RN, MSN, CCRN, CDE Diabetes Coordinator Inpatient Diabetes Program [phone] (Team Pager from [place] to [place]) [phone] ([facility] office) [phone] [facility] office)

## 2014-07-20 ENCOUNTER — Ambulatory Visit (INDEPENDENT_AMBULATORY_CARE_PROVIDER_SITE_OTHER): Payer: Self-pay | Admitting: Surgery

## 2014-07-20 ENCOUNTER — Encounter: Payer: Self-pay | Admitting: Surgery

## 2014-07-20 VITALS — BP 128/72 | HR 72 | Ht 65.0 in | Wt 173.3 lb

## 2014-07-20 DIAGNOSIS — I70299 Other atherosclerosis of native arteries of extremities, unspecified extremity: Secondary | ICD-10-CM

## 2014-07-20 DIAGNOSIS — L97509 Non-pressure chronic ulcer of other part of unspecified foot with unspecified severity: Secondary | ICD-10-CM

## 2014-07-20 LAB — TISSUE CULTURE: Gram Stain: NONE SEEN

## 2014-07-20 NOTE — Progress Notes (Signed)
Filed Vitals:   07/20/14 1504 07/20/14 1532  BP: 164/64 128/72  Pulse: 75 72  Height: 5\' 5"  (1.651 m)   Weight: 173 lb 4.8 oz (78.608 kg)   SpO2: 100%   Body mass index is 28.84 kg/(m^2).

## 2014-07-20 NOTE — Progress Notes (Signed)
Patient name: Theresa Harrington MRN: [id number] DOB: 20-Feb-1956 Sex: female     Chief Complaint  Patient presents with  . Re-evaluation    3 wk f/u s/p L 2nd toe amp    HISTORY OF PRESENT ILLNESS: The patient presented in February with swelling and redness of her left foot. She had her toenail removed earlier. She was admitted for IV antibiotics. She initially underwent left femoral-popliteal bypass graft by Dr. early on 02/06/2015. Unfortunately this occluded early. I took her back to the operating room on 06/09/2014 and redid her operation with Gore-Tex, 6 mm propatent. She was ultimately discharged from the hospital on IV antibiotics.  She then began hyperbaric oxygen treatment.  The last time I saw her she had necrotic changes to the tip of her left second toe with a ulcer between the second and third toe.  We felt the best course of action was amputation.  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  She is here today for follow-up  Past Medical History  Diagnosis Date  . Hyperlipidemia   . Hypertension   . PONV (postoperative nausea and vomiting)   . Right ureteral stone   . PAD (peripheral artery disease)   . Atherosclerosis of aorta   . GERD (gastroesophageal reflux disease)   . Asymptomatic cholelithiasis   . Peripheral arterial occlusive disease     lower extremities  . Wears glasses   . Hypothyroidism     no meds  . Kidney stones   . Type 2 diabetes mellitus   . Anemia   . Depression   . Anxiety   . Pneumonia     Past Surgical History  Procedure Laterality Date  . Belpharoptosis repair      eyelid lift  . Cystoscopy with stent placement Right 06/04/2013    Procedure: CYSTOSCOPY WITH STENT PLACEMENT;  Surgeon: Franchot Gallo, MD;  Location: [facility];  Service: Urology;  Laterality: Right;  . Right knee patellectomy w/ repair of extensor mechanism  04-14-2002  . Orif fifth metacarpal fx  right hand  04-21-2002  . Laparoscopic gastric banding   05-29-2005  . Revision and re-siting lap-band port  04-08-2010    W/  UPPER EGD  . Combined augmentation mammaplasty and abdominoplasty  2009    W/  BILATERAL  THIGH LIFT  . Cysto/  right ureteral stent placement  12-30-2010  . Extracorporeal shock wave lithotripsy Right 08-04-2013//   06-23-2013//   01-16-2011  . Cystoscopy with retrograde pyelogram, ureteroscopy and stent placement Right 10/07/2013    Procedure: CYSTOSCOPY WITH RETROGRADE PYELOGRAM, right URETEROSCOPY AND STENT PLACEMENT, stone extraction;  Surgeon: Arvil Persons, MD;  Location: [facility];  Service: Urology;  Laterality: Right;  . Holmium laser application Right 09/05/175    Procedure: HOLMIUM LASER APPLICATION;  Surgeon: Arvil Persons, MD;  Location: [facility];  Service: Urology;  Laterality: Right;  . Kidney stone surgery  April 2015    1-2 stones  . Toenail removed Left Jan. 21, 2016    2nd toenail-  Dr. Barkley Bruns  . Augmentation mammaplasty    . Lithotripsy  2-3 times  . Lower extremity angiogram N/A 06/04/2014    Procedure: LOWER EXTREMITY ANGIOGRAM;  Surgeon: Serafina Mitchell, MD;  Location: [facility] CATH LAB;  Service: Cardiovascular;  Laterality: N/A;  . Endarterectomy femoral Left 06/08/2014    Procedure: Left Leg Common Femoral and External Iliac  Endartarectomy with patch Angioplasty;  Surgeon: Rosetta Posner,  MD;  Location: [place];  Service: Vascular;  Laterality: Left;  . Femoral-popliteal bypass graft Left 06/08/2014    Procedure: Left Leg Femoral -Popliteal Bypass Graft;  Surgeon: Rosetta Posner, MD;  Location: [place];  Service: Vascular;  Laterality: Left;  . Femoral-popliteal bypass graft Left 06/09/2014    Procedure: Left Femoral and Popliteal Exposure; Left Femoral to Anterior Tibial Bypass Graft using Propaten 34mm by 80cm Goretex Graft; Left Tibial Endarterectomy; Left Femoraland Popliteal Thrombectomy ;  Surgeon: Serafina Mitchell, MD;  Location: [facility];  Service: Vascular;  Laterality: Left;  .  Amputation Left 07/16/2014    Procedure: LEFT SECOND TOE AMPUTATION;  Surgeon: Serafina Mitchell, MD;  Location: [facility];  Service: Vascular;  Laterality: Left;  With Nerve block    History   Social History  . Marital Status: Single    Spouse Name: N/A  . Number of Children: N/A  . Years of Education: N/A   Occupational History  . tester    Social History Main Topics  . Smoking status: Former Smoker -- 1.00 packs/day for 28 years    Types: Cigarettes    Quit date: 09/05/2001  . Smokeless tobacco: Never Used  . Alcohol Use: No  . Drug Use: No  . Sexual Activity: Yes   Other Topics Concern  . Not on file   Social History Narrative   Regular exercise- yes    Family History  Problem Relation Age of Onset  . Arthritis Other   . Cancer Other     colon  . Hypertension Other   . Stroke Other   . Diabetes Father   . Heart disease Father   . Deep vein thrombosis Father   . Hyperlipidemia Father   . Diabetes Sister   . Heart disease Sister   . Deep vein thrombosis Sister   . Hyperlipidemia Sister   . Diabetes Brother   . Heart disease Brother   . Cancer Brother   . Hyperlipidemia Brother   . Colon cancer Maternal Aunt   . Diabetes Brother   . Diabetes Brother   . Kidney disease Mother   . Hyperlipidemia Mother   . Other Mother     AAA   and    Amputation    Allergies as of 07/20/2014 - Review Complete 07/20/2014  Allergen Reaction Noted  . Amlodipine Other (See Comments) 11/26/2013  . Atorvastatin Other (See Comments) 10/28/2013  . Ampicillin Nausea And Vomiting 04/12/2011  . Codeine Nausea And Vomiting   . Lisinopril Other (See Comments) 10/05/2008  . Penicillins Nausea And Vomiting 05/01/2011  . Januvia [sitagliptin]  12/21/2011    Current Outpatient Prescriptions on File Prior to Visit  Medication Sig Dispense Refill  . aspirin EC 325 MG tablet Take 325 mg by mouth daily.    . canagliflozin (INVOKANA) 300 MG TABS tablet Take 300 mg by mouth daily. 90 tablet  3  . clopidogrel (PLAVIX) 75 MG tablet Take 1 tablet (75 mg total) by mouth daily. 30 tablet 3  . Insulin Detemir (LEVEMIR FLEXTOUCH) 100 UNIT/ML Pen Inject 30 Units into the skin daily at 10 pm. DX 250.72 15 mL 11  . Insulin Pen Needle (NOVOFINE PLUS) 32G X 4 MM MISC Use to inject once a day Dx 250.00 100 each 3  . levofloxacin (LEVAQUIN) 500 MG tablet Take 1 tablet (500 mg total) by mouth daily. (Patient taking differently: Take 500 mg by mouth daily. 15 day course started 06/14/14) 15 tablet 0  .  metFORMIN (GLUCOPHAGE) 500 MG tablet Take 1 tablet (500 mg total) by mouth 2 (two) times daily. 60 tablet 0  . oxyCODONE (OXY IR/ROXICODONE) 5 MG immediate release tablet Take 1 tablet (5 mg total) by mouth every 6 (six) hours as needed for severe pain. 30 tablet 0  . pantoprazole (PROTONIX) 40 MG tablet TAKE ONE TABLET BY MOUTH ONCE DAILY 90 tablet 3  . Pitavastatin Calcium (LIVALO) 2 MG TABS Take 1 tablet (2 mg total) by mouth daily. (Patient taking differently: Take 2 mg by mouth at bedtime. ) 30 tablet 11  . sulfamethoxazole-trimethoprim (BACTRIM DS,SEPTRA DS) 800-160 MG per tablet Take 1 tablet by mouth 2 (two) times daily. 28 tablet 0  . ezetimibe (ZETIA) 10 MG tablet Take 1 tablet (10 mg total) by mouth daily. (Patient not taking: Reported on 07/20/2014) 90 tablet 3  . Ferrous Sulfate (IRON) 325 (65 FE) MG TABS Take 325 mg by mouth daily.     [name] gabapentin (NEURONTIN) 300 MG capsule Take 1 capsule (300 mg total) by mouth 3 (three) times daily. (Patient not taking: Reported on 07/20/2014) 90 capsule 11  . Magnesium Oxide 400 MG CAPS Take 1 capsule (400 mg total) by mouth daily. (Patient not taking: Reported on 07/20/2014) 90 capsule 3   No current facility-administered medications on file prior to visit.      PHYSICAL EXAMINATION:   Vital signs are  Filed Vitals:   07/20/14 1504  BP: 164/64  Pulse: 75  Height: 5\' 5"  (1.651 m)  Weight: 173 lb 4.8 oz (78.608 kg)  SpO2: 100%   Body mass index  is 28.84 kg/(m^2). General: The patient appears their stated age. HEENT:  No gross abnormalities Pulmonary:  Non labored breathing Musculoskeletal: There are no major deformities. Neurologic: No focal weakness or paresthesias are detected, Skin: Toe amputation site looks great. Psychiatric: The patient has normal affect. Cardiovascular: 1+ edema   Diagnostic Studies None  Assessment: Status post left second toe interpretation Plan:   Amputation site looks excellent.  She will continue with dry dressing to keep this area covered.  I will have her follow-up in 3 weeks to remove her sutures.  In the interim, she will continue hyperbaric oxygen therapy. PCP to manage HTN  V. Leia Alf, M.D. [facility] Office: [phone] Pager:  [phone]

## 2014-07-21 DIAGNOSIS — E1152 Type 2 diabetes mellitus with diabetic peripheral angiopathy with gangrene: Secondary | ICD-10-CM | POA: Diagnosis not present

## 2014-07-21 DIAGNOSIS — L97522 Non-pressure chronic ulcer of other part of left foot with fat layer exposed: Secondary | ICD-10-CM | POA: Diagnosis not present

## 2014-07-21 DIAGNOSIS — E11621 Type 2 diabetes mellitus with foot ulcer: Secondary | ICD-10-CM | POA: Diagnosis not present

## 2014-07-21 DIAGNOSIS — I70222 Atherosclerosis of native arteries of extremities with rest pain, left leg: Secondary | ICD-10-CM | POA: Diagnosis not present

## 2014-07-21 LAB — GLUCOSE, CAPILLARY
GLUCOSE-CAPILLARY: 248 mg/dL — AB (ref 70–99)
Glucose-Capillary: 152 mg/dL — ABNORMAL HIGH (ref 70–99)

## 2014-07-22 LAB — ANAEROBIC CULTURE

## 2014-07-23 DIAGNOSIS — E1152 Type 2 diabetes mellitus with diabetic peripheral angiopathy with gangrene: Secondary | ICD-10-CM | POA: Diagnosis not present

## 2014-07-23 DIAGNOSIS — I70222 Atherosclerosis of native arteries of extremities with rest pain, left leg: Secondary | ICD-10-CM | POA: Diagnosis not present

## 2014-07-23 DIAGNOSIS — E11621 Type 2 diabetes mellitus with foot ulcer: Secondary | ICD-10-CM | POA: Diagnosis not present

## 2014-07-23 DIAGNOSIS — L97522 Non-pressure chronic ulcer of other part of left foot with fat layer exposed: Secondary | ICD-10-CM | POA: Diagnosis not present

## 2014-07-23 LAB — GLUCOSE, CAPILLARY
Glucose-Capillary: 276 mg/dL — ABNORMAL HIGH (ref 70–99)
Glucose-Capillary: 195 mg/dL — ABNORMAL HIGH (ref 70–99)

## 2014-07-24 DIAGNOSIS — E11621 Type 2 diabetes mellitus with foot ulcer: Secondary | ICD-10-CM | POA: Diagnosis not present

## 2014-07-24 DIAGNOSIS — I70222 Atherosclerosis of native arteries of extremities with rest pain, left leg: Secondary | ICD-10-CM | POA: Diagnosis not present

## 2014-07-24 DIAGNOSIS — L97522 Non-pressure chronic ulcer of other part of left foot with fat layer exposed: Secondary | ICD-10-CM | POA: Diagnosis not present

## 2014-07-24 DIAGNOSIS — E1152 Type 2 diabetes mellitus with diabetic peripheral angiopathy with gangrene: Secondary | ICD-10-CM | POA: Diagnosis not present

## 2014-07-24 LAB — GLUCOSE, CAPILLARY
GLUCOSE-CAPILLARY: 201 mg/dL — AB (ref 70–99)
GLUCOSE-CAPILLARY: 172 mg/dL — AB (ref 70–99)

## 2014-07-27 DIAGNOSIS — E11621 Type 2 diabetes mellitus with foot ulcer: Secondary | ICD-10-CM | POA: Diagnosis not present

## 2014-07-27 DIAGNOSIS — L97522 Non-pressure chronic ulcer of other part of left foot with fat layer exposed: Secondary | ICD-10-CM | POA: Diagnosis not present

## 2014-07-27 DIAGNOSIS — E1152 Type 2 diabetes mellitus with diabetic peripheral angiopathy with gangrene: Secondary | ICD-10-CM | POA: Diagnosis not present

## 2014-07-27 DIAGNOSIS — I70222 Atherosclerosis of native arteries of extremities with rest pain, left leg: Secondary | ICD-10-CM | POA: Diagnosis not present

## 2014-07-27 LAB — GLUCOSE, CAPILLARY
GLUCOSE-CAPILLARY: 305 mg/dL — AB (ref 70–99)
GLUCOSE-CAPILLARY: 169 mg/dL — AB (ref 70–99)

## 2014-07-28 DIAGNOSIS — E1152 Type 2 diabetes mellitus with diabetic peripheral angiopathy with gangrene: Secondary | ICD-10-CM | POA: Diagnosis not present

## 2014-07-28 DIAGNOSIS — I70222 Atherosclerosis of native arteries of extremities with rest pain, left leg: Secondary | ICD-10-CM | POA: Diagnosis not present

## 2014-07-28 DIAGNOSIS — L97522 Non-pressure chronic ulcer of other part of left foot with fat layer exposed: Secondary | ICD-10-CM | POA: Diagnosis not present

## 2014-07-28 DIAGNOSIS — E11621 Type 2 diabetes mellitus with foot ulcer: Secondary | ICD-10-CM | POA: Diagnosis not present

## 2014-07-28 LAB — GLUCOSE, CAPILLARY
Glucose-Capillary: 134 mg/dL — ABNORMAL HIGH (ref 70–99)
Glucose-Capillary: 129 mg/dL — ABNORMAL HIGH (ref 70–99)

## 2014-07-29 DIAGNOSIS — L97522 Non-pressure chronic ulcer of other part of left foot with fat layer exposed: Secondary | ICD-10-CM | POA: Diagnosis not present

## 2014-07-29 LAB — GLUCOSE, CAPILLARY
Glucose-Capillary: 150 mg/dL — ABNORMAL HIGH (ref 70–99)
Glucose-Capillary: 77 mg/dL (ref 70–99)

## 2014-07-30 DIAGNOSIS — E1152 Type 2 diabetes mellitus with diabetic peripheral angiopathy with gangrene: Secondary | ICD-10-CM | POA: Diagnosis not present

## 2014-07-30 DIAGNOSIS — L97522 Non-pressure chronic ulcer of other part of left foot with fat layer exposed: Secondary | ICD-10-CM | POA: Diagnosis not present

## 2014-07-30 DIAGNOSIS — I70222 Atherosclerosis of native arteries of extremities with rest pain, left leg: Secondary | ICD-10-CM | POA: Diagnosis not present

## 2014-07-30 DIAGNOSIS — E11621 Type 2 diabetes mellitus with foot ulcer: Secondary | ICD-10-CM | POA: Diagnosis not present

## 2014-07-30 LAB — GLUCOSE, CAPILLARY
GLUCOSE-CAPILLARY: 159 mg/dL — AB (ref 70–99)
Glucose-Capillary: 178 mg/dL — ABNORMAL HIGH (ref 70–99)

## 2014-07-31 ENCOUNTER — Encounter (HOSPITAL_BASED_OUTPATIENT_CLINIC_OR_DEPARTMENT_OTHER): Payer: 59 | Attending: Plastic Surgery

## 2014-07-31 DIAGNOSIS — L97522 Non-pressure chronic ulcer of other part of left foot with fat layer exposed: Secondary | ICD-10-CM | POA: Insufficient documentation

## 2014-07-31 DIAGNOSIS — Z89422 Acquired absence of other left toe(s): Secondary | ICD-10-CM | POA: Insufficient documentation

## 2014-07-31 DIAGNOSIS — I70222 Atherosclerosis of native arteries of extremities with rest pain, left leg: Secondary | ICD-10-CM | POA: Insufficient documentation

## 2014-07-31 DIAGNOSIS — E1152 Type 2 diabetes mellitus with diabetic peripheral angiopathy with gangrene: Secondary | ICD-10-CM | POA: Insufficient documentation

## 2014-07-31 DIAGNOSIS — I10 Essential (primary) hypertension: Secondary | ICD-10-CM | POA: Insufficient documentation

## 2014-07-31 DIAGNOSIS — E11621 Type 2 diabetes mellitus with foot ulcer: Secondary | ICD-10-CM | POA: Insufficient documentation

## 2014-07-31 LAB — GLUCOSE, CAPILLARY
Glucose-Capillary: 171 mg/dL — ABNORMAL HIGH (ref 70–99)
Glucose-Capillary: 169 mg/dL — ABNORMAL HIGH (ref 70–99)

## 2014-08-03 NOTE — Discharge Summary (Signed)
[facility] Discharge Summary   Patient ID:  Theresa Harrington MRN: [id number] DOB/AGE: December 14, 1955 59 y.o.  Admit date: 07/16/2014 Discharge date: 07/17/2014 Date of Surgery: 07/16/2014 Surgeon: Surgeon(s): Serafina Mitchell, MD  Admission Diagnosis: Peripheral vascular disease with left toe ulcer I70.245  Discharge Diagnoses:  Peripheral vascular disease with left toe ulcer I70.245  Secondary Diagnoses: Past Medical History  Diagnosis Date  . Hyperlipidemia   . Hypertension   . PONV (postoperative nausea and vomiting)   . Right ureteral stone   . PAD (peripheral artery disease)   . Atherosclerosis of aorta   . GERD (gastroesophageal reflux disease)   . Asymptomatic cholelithiasis   . Peripheral arterial occlusive disease     lower extremities  . Wears glasses   . Hypothyroidism     no meds  . Kidney stones   . Type 2 diabetes mellitus   . Anemia   . Depression   . Anxiety   . Pneumonia     Procedure(s): LEFT SECOND TOE AMPUTATION  Discharged Condition: good  HPI: The patient presented in February with swelling and redness of her left foot. She had her toenail removed earlier. She was admitted for IV antibiotics. She initially underwent left femoral-popliteal bypass graft by Dr. early on 02/06/2015. Unfortunately this occluded early. I took her back to the operating room on 06/09/2014 and redid her operation with Gore-Tex, 6 mm propatent. She was ultimately discharged from the hospital on IV antibiotics.   Hospital Course:  Theresa Harrington is a 59 y.o. female is S/P Left Procedure(s): LEFT SECOND TOE AMPUTATION Doing well over all s/p amputation without active bleeding.  Clean dry dressing applied.  She was instructed to keep dressing intact and follow up with Dr. Trula Slade on 07/20/2014.   Consults: none    Significant Diagnostic Studies: CBC Lab Results  Component Value Date   WBC 8.5 07/17/2014   HGB 11.0* 07/17/2014   HCT 33.6* 07/17/2014   MCV 87.3 07/17/2014   PLT 219 07/17/2014    BMET    Component Value Date/Time   NA 136 07/17/2014 0118   K 4.5 07/17/2014 0118   CL 103 07/17/2014 0118   CO2 23 07/17/2014 0118   GLUCOSE 224* 07/17/2014 0118   BUN 14 07/17/2014 0118   CREATININE 0.91 07/17/2014 0118   CALCIUM 9.1 07/17/2014 0118   GFRNONAA 69* 07/17/2014 0118   GFRAA 80* 07/17/2014 0118   COAG Lab Results  Component Value Date   INR 1.05 07/16/2014   INR 1.03 06/01/2014     Disposition:  Discharge to :Home Discharge Instructions    Call MD for:  redness, tenderness, or signs of infection (pain, swelling, bleeding, redness, odor or green/yellow discharge around incision site)    Complete by:  As directed      Call MD for:  severe or increased pain, loss or decreased feeling  in affected limb(s)    Complete by:  As directed      Call MD for:  temperature >100.5    Complete by:  As directed      Discharge patient    Complete by:  As directed   Discharge pt to home     Discharge wound care:    Complete by:  As directed   Keep dressing on left foot until seen by Dr. Trula Slade on Monday 07/20/14.     Driving Restrictions    Complete by:  As directed   No driving while on pain medication  Increase activity slowly    Complete by:  As directed   Walk with assistance if needed. Wear post-op shoe and weightbearing on left heel only.     Lifting restrictions    Complete by:  As directed   No lifting for 1 week     Resume previous diet    Complete by:  As directed             Medication List    TAKE these medications        aspirin EC 325 MG tablet  Take 325 mg by mouth daily.     canagliflozin 300 MG Tabs tablet  Commonly known as:  INVOKANA  Take 300 mg by mouth daily.     clopidogrel 75 MG tablet  Commonly known as:  PLAVIX  Take 1 tablet (75 mg total) by mouth daily.     ezetimibe 10 MG tablet  Commonly known as:  ZETIA  Take 1 tablet (10 mg total) by mouth daily.     gabapentin 300  MG capsule  Commonly known as:  NEURONTIN  Take 1 capsule (300 mg total) by mouth 3 (three) times daily.     Insulin Detemir 100 UNIT/ML Pen  Commonly known as:  LEVEMIR FLEXTOUCH  Inject 30 Units into the skin daily at 10 pm. DX 250.72     Insulin Pen Needle 32G X 4 MM Misc  Commonly known as:  NOVOFINE PLUS  - Use to inject once a day  - Dx 250.00     Iron 325 (65 FE) MG Tabs  Take 325 mg by mouth daily.     levofloxacin 500 MG tablet  Commonly known as:  LEVAQUIN  Take 1 tablet (500 mg total) by mouth daily.     Magnesium Oxide 400 MG Caps  Take 1 capsule (400 mg total) by mouth daily.     metFORMIN 500 MG tablet  Commonly known as:  GLUCOPHAGE  Take 1 tablet (500 mg total) by mouth 2 (two) times daily.     oxyCODONE 5 MG immediate release tablet  Commonly known as:  Oxy IR/ROXICODONE  Take 1 tablet (5 mg total) by mouth every 6 (six) hours as needed for severe pain.     pantoprazole 40 MG tablet  Commonly known as:  PROTONIX  TAKE ONE TABLET BY MOUTH ONCE DAILY     Pitavastatin Calcium 2 MG Tabs  Commonly known as:  LIVALO  Take 1 tablet (2 mg total) by mouth daily.     sulfamethoxazole-trimethoprim 800-160 MG per tablet  Commonly known as:  BACTRIM DS,SEPTRA DS  Take 1 tablet by mouth 2 (two) times daily.       Verbal and written Discharge instructions given to the patient. Wound care per Discharge AVS     Follow-up Information    Follow up with Eldridge Abrahams, MD On 07/20/2014.   Specialty:  Vascular Surgery   Contact information:   [place] [address] [phone]       Signed: Laurence Slate [facility] 08/03/2014, 12:12 PM

## 2014-08-04 ENCOUNTER — Other Ambulatory Visit (INDEPENDENT_AMBULATORY_CARE_PROVIDER_SITE_OTHER): Payer: 59

## 2014-08-04 ENCOUNTER — Encounter: Payer: Self-pay | Admitting: Internal Medicine

## 2014-08-04 ENCOUNTER — Ambulatory Visit (INDEPENDENT_AMBULATORY_CARE_PROVIDER_SITE_OTHER): Payer: 59 | Admitting: Internal Medicine

## 2014-08-04 VITALS — BP 122/60 | HR 64 | Temp 97.9°F | Resp 16 | Wt 172.0 lb

## 2014-08-04 DIAGNOSIS — L97522 Non-pressure chronic ulcer of other part of left foot with fat layer exposed: Secondary | ICD-10-CM | POA: Diagnosis not present

## 2014-08-04 DIAGNOSIS — E785 Hyperlipidemia, unspecified: Secondary | ICD-10-CM

## 2014-08-04 DIAGNOSIS — N390 Urinary tract infection, site not specified: Secondary | ICD-10-CM | POA: Diagnosis not present

## 2014-08-04 DIAGNOSIS — E1151 Type 2 diabetes mellitus with diabetic peripheral angiopathy without gangrene: Secondary | ICD-10-CM

## 2014-08-04 DIAGNOSIS — D509 Iron deficiency anemia, unspecified: Secondary | ICD-10-CM

## 2014-08-04 DIAGNOSIS — E538 Deficiency of other specified B group vitamins: Secondary | ICD-10-CM

## 2014-08-04 DIAGNOSIS — G63 Polyneuropathy in diseases classified elsewhere: Secondary | ICD-10-CM

## 2014-08-04 DIAGNOSIS — I10 Essential (primary) hypertension: Secondary | ICD-10-CM

## 2014-08-04 DIAGNOSIS — I70222 Atherosclerosis of native arteries of extremities with rest pain, left leg: Secondary | ICD-10-CM | POA: Diagnosis not present

## 2014-08-04 DIAGNOSIS — E1152 Type 2 diabetes mellitus with diabetic peripheral angiopathy with gangrene: Secondary | ICD-10-CM | POA: Diagnosis not present

## 2014-08-04 DIAGNOSIS — E11621 Type 2 diabetes mellitus with foot ulcer: Secondary | ICD-10-CM | POA: Diagnosis not present

## 2014-08-04 LAB — URINALYSIS, ROUTINE W REFLEX MICROSCOPIC
Bilirubin Urine: NEGATIVE
Ketones, ur: NEGATIVE
Nitrite: NEGATIVE
Specific Gravity, Urine: >=1.03 — AB (ref 1.000–1.030)
Total Protein, Urine: 100 — AB
UROBILINOGEN UA: 0.2 (ref 0.0–1.0)
Urine Glucose: >=1000 — AB
pH: 5.5 (ref 5.0–8.0)

## 2014-08-04 LAB — CBC WITH DIFFERENTIAL/PLATELET
BASOS ABS: 0 10*3/uL (ref 0.0–0.1)
BASOS PCT: 0.4 % (ref 0.0–3.0)
Eosinophils Absolute: 0.1 10*3/uL (ref 0.0–0.7)
Eosinophils Relative: 1.3 % (ref 0.0–5.0)
HCT: 38.3 % (ref 36.0–46.0)
Hemoglobin: 12.7 g/dL (ref 12.0–15.0)
Lymphocytes Relative: 24.9 % (ref 12.0–46.0)
Lymphs Abs: 2.1 10*3/uL (ref 0.7–4.0)
MCHC: 33.1 g/dL (ref 30.0–36.0)
MCV: 85.4 fl (ref 78.0–100.0)
MONO ABS: 0.4 10*3/uL (ref 0.1–1.0)
Monocytes Relative: 4.8 % (ref 3.0–12.0)
NEUTROS ABS: 5.8 10*3/uL (ref 1.4–7.7)
Neutrophils Relative %: 68.6 % (ref 43.0–77.0)
Platelets: 314 10*3/uL (ref 150.0–400.0)
RBC: 4.48 Mil/uL (ref 3.87–5.11)
RDW: 15.3 % (ref 11.5–15.5)
WBC: 8.5 10*3/uL (ref 4.0–10.5)

## 2014-08-04 LAB — BASIC METABOLIC PANEL
BUN: 18 mg/dL (ref 6–23)
CO2: 27 mEq/L (ref 19–32)
CREATININE: 0.98 mg/dL (ref 0.40–1.20)
Calcium: 10.3 mg/dL (ref 8.4–10.5)
Chloride: 102 mEq/L (ref 96–112)
GFR: 75.11 mL/min (ref >60.00)
GLUCOSE: 134 mg/dL — AB (ref 70–99)
POTASSIUM: 4.2 meq/L (ref 3.5–5.1)
Sodium: 138 mEq/L (ref 135–145)

## 2014-08-04 LAB — GLUCOSE, CAPILLARY
GLUCOSE-CAPILLARY: 239 mg/dL — AB (ref 70–99)
Glucose-Capillary: 198 mg/dL — ABNORMAL HIGH (ref 70–99)

## 2014-08-04 LAB — HEMOGLOBIN A1C: Hgb A1c MFr Bld: 8.6 % — ABNORMAL HIGH (ref 4.6–6.5)

## 2014-08-04 MED ORDER — CANAGLIFLOZIN-METFORMIN HCL 150-1000 MG PO TABS: 1.0000 | ORAL_TABLET | Freq: Two times a day (BID) | ORAL | Status: DC

## 2014-08-04 MED ORDER — INSULIN DETEMIR 100 UNIT/ML FLEXPEN: 40.0000 [IU] | PEN_INJECTOR | Freq: Every day | SUBCUTANEOUS | Status: DC

## 2014-08-04 MED ORDER — CYANOCOBALAMIN 1000 MCG/ML IJ SOLN
1000.0000 ug | Freq: Once | INTRAMUSCULAR | Status: AC
  Administered 2014-08-04: 1000 ug via INTRAMUSCULAR

## 2014-08-04 NOTE — Progress Notes (Signed)
Subjective:    Patient ID: Theresa Harrington, female    DOB: 12-23-1955, 59 y.o.   MRN: [id number]  Diabetes She presents for her follow-up diabetic visit. She has type 2 diabetes mellitus. Her disease course has been stable. There are no hypoglycemic associated symptoms. Associated symptoms include polydipsia and polyphagia. Pertinent negatives for diabetes include no blurred vision, no chest pain, no fatigue, no foot paresthesias, no polyuria, no visual change, no weakness and no weight loss. There are no hypoglycemic complications. Diabetic complications include PVD. Current diabetic treatment includes insulin injections, intensive insulin program and oral agent (dual therapy). She is compliant with treatment most of the time. Her weight is stable. She is following a generally healthy diet. She never participates in exercise. Her home blood glucose trend is increasing steadily. Her breakfast blood glucose range is generally >200 mg/dl. Her lunch blood glucose range is generally >200 mg/dl. Her dinner blood glucose range is generally >200 mg/dl. Her highest blood glucose is >200 mg/dl. Her overall blood glucose range is >200 mg/dl. An ACE inhibitor/angiotensin II receptor blocker is being taken. She does not see a podiatrist.Eye exam is current.      Review of Systems  Constitutional: Negative.  Negative for chills, weight loss, diaphoresis, appetite change and fatigue.  HENT: Negative.   Eyes: Negative.  Negative for blurred vision.  Respiratory: Negative.  Negative for cough, choking, shortness of breath and stridor.   Cardiovascular: Negative.  Negative for chest pain, palpitations and leg swelling.  Gastrointestinal: Negative.  Negative for nausea, vomiting, abdominal pain, diarrhea, constipation and blood in stool.  Endocrine: Positive for polydipsia and polyphagia. Negative for polyuria.  Genitourinary: Negative.   Musculoskeletal: Negative.  Negative for myalgias, back pain, joint swelling  and arthralgias.  Skin: Negative.  Negative for rash.  Allergic/Immunologic: Negative.   Neurological: Negative.  Negative for weakness.  Hematological: Negative.  Negative for adenopathy. Does not bruise/bleed easily.  Psychiatric/Behavioral: Negative.        Objective:   Physical Exam  Constitutional: She is oriented to person, place, and time. She appears well-developed and well-nourished. No distress.  HENT:  Head: Normocephalic and atraumatic.  Mouth/Throat: Oropharynx is clear and moist. No oropharyngeal exudate.  Eyes: Conjunctivae are normal. Right eye exhibits no discharge. Left eye exhibits no discharge. No scleral icterus.  Neck: Normal range of motion. Neck supple. No JVD present. No tracheal deviation present. No thyromegaly present.  Cardiovascular: Normal rate, regular rhythm, normal heart sounds and intact distal pulses.  Exam reveals no gallop and no friction rub.   No murmur heard. Pulmonary/Chest: Effort normal and breath sounds normal. No stridor. No respiratory distress. She has no wheezes. She has no rales. She exhibits no tenderness.  Abdominal: Soft. Bowel sounds are normal. She exhibits no distension and no mass. There is no tenderness. There is no rebound and no guarding.  Musculoskeletal: Normal range of motion. She exhibits no edema or tenderness.  Lymphadenopathy:    She has no cervical adenopathy.  Neurological: She is oriented to person, place, and time.  Skin: Skin is warm and dry. No rash noted. She is not diaphoretic. No erythema. No pallor.  Vitals reviewed.    Lab Results  Component Value Date   WBC 8.5 07/17/2014   HGB 11.0* 07/17/2014   HCT 33.6* 07/17/2014   PLT 219 07/17/2014   GLUCOSE 224* 07/17/2014   CHOL 227* 02/05/2014   TRIG 140.0 02/05/2014   HDL 46.40 02/05/2014   LDLDIRECT 160.2 03/01/2010  LDLCALC 153* 02/05/2014   ALT 18 07/16/2014   AST 18 07/16/2014   NA 136 07/17/2014   K 4.5 07/17/2014   CL 103 07/17/2014    CREATININE 0.91 07/17/2014   BUN 14 07/17/2014   CO2 23 07/17/2014   TSH 0.596 06/15/2014   INR 1.05 07/16/2014   HGBA1C 7.2* 06/15/2014   MICROALBUR 3.5* 03/01/2010       Assessment & Plan:

## 2014-08-04 NOTE — Assessment & Plan Note (Signed)
She has had blood sugars consistently over 200 for the last 2-3 weeks and some over 400 several times a week, will increase the basal insulin to 40 units qd, will change to invokana and metformin to invokamet with a higher dose of metformin Will recheck her A1C today and will monitor her renal function

## 2014-08-04 NOTE — Progress Notes (Signed)
Pre visit review using our clinic review tool, if applicable. No additional management support is needed unless otherwise documented below in the visit note. 

## 2014-08-04 NOTE — Assessment & Plan Note (Signed)
Her BP is well controlled Will monitor her lytes and renal function 

## 2014-08-04 NOTE — Patient Instructions (Signed)

## 2014-08-04 NOTE — Assessment & Plan Note (Signed)
B12 injection today Check CBC to screen for anemia

## 2014-08-04 NOTE — Assessment & Plan Note (Signed)
Will recheck her UA and urine clx to see if there is a persistent infection

## 2014-08-05 DIAGNOSIS — I70222 Atherosclerosis of native arteries of extremities with rest pain, left leg: Secondary | ICD-10-CM | POA: Diagnosis not present

## 2014-08-05 LAB — GLUCOSE, CAPILLARY
Glucose-Capillary: 173 mg/dL — ABNORMAL HIGH (ref 70–99)
Glucose-Capillary: 145 mg/dL — ABNORMAL HIGH (ref 70–99)

## 2014-08-05 LAB — CULTURE, URINE COMPREHENSIVE
Colony Count: NO GROWTH
Organism ID, Bacteria: NO GROWTH

## 2014-08-06 ENCOUNTER — Encounter: Payer: Self-pay | Admitting: Internal Medicine

## 2014-08-06 DIAGNOSIS — L97522 Non-pressure chronic ulcer of other part of left foot with fat layer exposed: Secondary | ICD-10-CM | POA: Diagnosis not present

## 2014-08-06 DIAGNOSIS — E1152 Type 2 diabetes mellitus with diabetic peripheral angiopathy with gangrene: Secondary | ICD-10-CM | POA: Diagnosis not present

## 2014-08-06 DIAGNOSIS — I70222 Atherosclerosis of native arteries of extremities with rest pain, left leg: Secondary | ICD-10-CM | POA: Diagnosis not present

## 2014-08-06 DIAGNOSIS — E11621 Type 2 diabetes mellitus with foot ulcer: Secondary | ICD-10-CM | POA: Diagnosis not present

## 2014-08-06 LAB — GLUCOSE, CAPILLARY
Glucose-Capillary: 143 mg/dL — ABNORMAL HIGH (ref 70–99)
Glucose-Capillary: 88 mg/dL (ref 70–99)

## 2014-08-07 DIAGNOSIS — E1152 Type 2 diabetes mellitus with diabetic peripheral angiopathy with gangrene: Secondary | ICD-10-CM | POA: Diagnosis not present

## 2014-08-07 DIAGNOSIS — E11621 Type 2 diabetes mellitus with foot ulcer: Secondary | ICD-10-CM | POA: Diagnosis not present

## 2014-08-07 DIAGNOSIS — I70222 Atherosclerosis of native arteries of extremities with rest pain, left leg: Secondary | ICD-10-CM | POA: Diagnosis not present

## 2014-08-07 DIAGNOSIS — L97522 Non-pressure chronic ulcer of other part of left foot with fat layer exposed: Secondary | ICD-10-CM | POA: Diagnosis not present

## 2014-08-10 DIAGNOSIS — E11621 Type 2 diabetes mellitus with foot ulcer: Secondary | ICD-10-CM | POA: Diagnosis not present

## 2014-08-10 DIAGNOSIS — L97522 Non-pressure chronic ulcer of other part of left foot with fat layer exposed: Secondary | ICD-10-CM | POA: Diagnosis not present

## 2014-08-10 DIAGNOSIS — E1152 Type 2 diabetes mellitus with diabetic peripheral angiopathy with gangrene: Secondary | ICD-10-CM | POA: Diagnosis not present

## 2014-08-10 DIAGNOSIS — I70222 Atherosclerosis of native arteries of extremities with rest pain, left leg: Secondary | ICD-10-CM | POA: Diagnosis not present

## 2014-08-10 LAB — GLUCOSE, CAPILLARY
GLUCOSE-CAPILLARY: 223 mg/dL — AB (ref 70–99)
GLUCOSE-CAPILLARY: 191 mg/dL — AB (ref 70–99)
Glucose-Capillary: 234 mg/dL — ABNORMAL HIGH (ref 70–99)
Glucose-Capillary: 90 mg/dL (ref 70–99)

## 2014-08-11 DIAGNOSIS — L97522 Non-pressure chronic ulcer of other part of left foot with fat layer exposed: Secondary | ICD-10-CM | POA: Diagnosis not present

## 2014-08-11 DIAGNOSIS — E11621 Type 2 diabetes mellitus with foot ulcer: Secondary | ICD-10-CM | POA: Diagnosis not present

## 2014-08-11 DIAGNOSIS — E1152 Type 2 diabetes mellitus with diabetic peripheral angiopathy with gangrene: Secondary | ICD-10-CM | POA: Diagnosis not present

## 2014-08-11 DIAGNOSIS — I70222 Atherosclerosis of native arteries of extremities with rest pain, left leg: Secondary | ICD-10-CM | POA: Diagnosis not present

## 2014-08-12 DIAGNOSIS — I70222 Atherosclerosis of native arteries of extremities with rest pain, left leg: Secondary | ICD-10-CM | POA: Diagnosis not present

## 2014-08-12 LAB — GLUCOSE, CAPILLARY
GLUCOSE-CAPILLARY: 124 mg/dL — AB (ref 70–99)
GLUCOSE-CAPILLARY: 120 mg/dL — AB (ref 70–99)
Glucose-Capillary: 105 mg/dL — ABNORMAL HIGH (ref 70–99)
Glucose-Capillary: 165 mg/dL — ABNORMAL HIGH (ref 70–99)

## 2014-08-13 DIAGNOSIS — I70222 Atherosclerosis of native arteries of extremities with rest pain, left leg: Secondary | ICD-10-CM | POA: Diagnosis not present

## 2014-08-13 DIAGNOSIS — E1152 Type 2 diabetes mellitus with diabetic peripheral angiopathy with gangrene: Secondary | ICD-10-CM | POA: Diagnosis not present

## 2014-08-13 DIAGNOSIS — L97522 Non-pressure chronic ulcer of other part of left foot with fat layer exposed: Secondary | ICD-10-CM | POA: Diagnosis not present

## 2014-08-13 DIAGNOSIS — E11621 Type 2 diabetes mellitus with foot ulcer: Secondary | ICD-10-CM | POA: Diagnosis not present

## 2014-08-13 LAB — GLUCOSE, CAPILLARY
Glucose-Capillary: 155 mg/dL — ABNORMAL HIGH (ref 70–99)
Glucose-Capillary: 153 mg/dL — ABNORMAL HIGH (ref 70–99)

## 2014-08-14 ENCOUNTER — Encounter: Payer: Self-pay | Admitting: Surgery

## 2014-08-14 DIAGNOSIS — E11621 Type 2 diabetes mellitus with foot ulcer: Secondary | ICD-10-CM | POA: Diagnosis not present

## 2014-08-14 DIAGNOSIS — E1152 Type 2 diabetes mellitus with diabetic peripheral angiopathy with gangrene: Secondary | ICD-10-CM | POA: Diagnosis not present

## 2014-08-14 DIAGNOSIS — L97522 Non-pressure chronic ulcer of other part of left foot with fat layer exposed: Secondary | ICD-10-CM | POA: Diagnosis not present

## 2014-08-14 DIAGNOSIS — I70222 Atherosclerosis of native arteries of extremities with rest pain, left leg: Secondary | ICD-10-CM | POA: Diagnosis not present

## 2014-08-14 LAB — GLUCOSE, CAPILLARY
Glucose-Capillary: 166 mg/dL — ABNORMAL HIGH (ref 70–99)
Glucose-Capillary: 245 mg/dL — ABNORMAL HIGH (ref 70–99)

## 2014-08-17 ENCOUNTER — Encounter: Payer: Self-pay | Admitting: Surgery

## 2014-08-17 ENCOUNTER — Other Ambulatory Visit: Payer: Self-pay

## 2014-08-17 ENCOUNTER — Ambulatory Visit (INDEPENDENT_AMBULATORY_CARE_PROVIDER_SITE_OTHER): Payer: Self-pay | Admitting: Surgery

## 2014-08-17 VITALS — BP 116/75 | HR 46 | Ht 65.0 in | Wt 175.0 lb

## 2014-08-17 DIAGNOSIS — E1152 Type 2 diabetes mellitus with diabetic peripheral angiopathy with gangrene: Secondary | ICD-10-CM | POA: Diagnosis not present

## 2014-08-17 DIAGNOSIS — I739 Peripheral vascular disease, unspecified: Secondary | ICD-10-CM

## 2014-08-17 DIAGNOSIS — E11621 Type 2 diabetes mellitus with foot ulcer: Secondary | ICD-10-CM | POA: Diagnosis not present

## 2014-08-17 DIAGNOSIS — Z1231 Encounter for screening mammogram for malignant neoplasm of breast: Secondary | ICD-10-CM

## 2014-08-17 DIAGNOSIS — I70222 Atherosclerosis of native arteries of extremities with rest pain, left leg: Secondary | ICD-10-CM | POA: Diagnosis not present

## 2014-08-17 DIAGNOSIS — L97522 Non-pressure chronic ulcer of other part of left foot with fat layer exposed: Secondary | ICD-10-CM | POA: Diagnosis not present

## 2014-08-17 DIAGNOSIS — Z48812 Encounter for surgical aftercare following surgery on the circulatory system: Secondary | ICD-10-CM

## 2014-08-17 LAB — GLUCOSE, CAPILLARY
Glucose-Capillary: 183 mg/dL — ABNORMAL HIGH (ref 70–99)
Glucose-Capillary: 109 mg/dL — ABNORMAL HIGH (ref 70–99)

## 2014-08-17 NOTE — Progress Notes (Signed)
POST OPERATIVE OFFICE NOTE    CC:  F/u for surgery  HPI:  This is a 59 y.o. female who is initially underwent left femoral-popliteal bypass graft by Dr. early on 02/06/2015. Unfortunately this occluded early. I took her back to the operating room on 06/09/2014 and redid her operation with Gore-Tex, 6 mm propatent. She was ultimately discharged from the hospital on IV antibiotics. She then began hyperbaric oxygen treatment. The last time I saw her she had necrotic changes to the tip of her left second toe with a ulcer between the second and third toe. We felt the best course of action was amputation. 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 She was seen back on 07/20/14 and her amp site was healing nicely.  She presents today for wound check.  She states that she is still have some swelling in the left leg.  It is somewhat improved in the mornings after being in bed, but is still not normal.  She does states that her sugar has been less controlled and her A1c has gone from ~ 7.2 to 8.9.   Allergies  Allergen Reactions  . Amlodipine Other (See Comments)    edema  . Atorvastatin Other (See Comments)    muscle aches  . Ampicillin Nausea And Vomiting  . Codeine Nausea And Vomiting  . Lisinopril Other (See Comments)     cough  . Penicillins Nausea And Vomiting  . Januvia [Sitagliptin]     nausea    Current Outpatient Prescriptions  Medication Sig Dispense Refill  . aspirin EC 325 MG tablet Take 325 mg by mouth daily.    . Canagliflozin-Metformin HCl [phone] MG TABS Take 1 tablet by mouth 2 (two) times daily. 180 tablet 1  . clopidogrel (PLAVIX) 75 MG tablet Take 1 tablet (75 mg total) by mouth daily. 30 tablet 3  . ezetimibe (ZETIA) 10 MG tablet Take 1 tablet (10 mg total) by mouth daily. 90 tablet 3  . Ferrous Sulfate (IRON) 325 (65 FE) MG TABS Take 325 mg by mouth daily.     [name] gabapentin (NEURONTIN) 300 MG capsule Take 1 capsule (300 mg total) by mouth 3 (three)  times daily. 90 capsule 11  . Insulin Detemir (LEVEMIR FLEXTOUCH) 100 UNIT/ML Pen Inject 40 Units into the skin daily at 10 pm. DX 250.72 15 mL 11  . Insulin Pen Needle (NOVOFINE PLUS) 32G X 4 MM MISC Use to inject once a day Dx 250.00 100 each 3  . Magnesium Oxide 400 MG CAPS Take 1 capsule (400 mg total) by mouth daily. 90 capsule 3  . pantoprazole (PROTONIX) 40 MG tablet TAKE ONE TABLET BY MOUTH ONCE DAILY 90 tablet 3  . Pitavastatin Calcium (LIVALO) 2 MG TABS Take 1 tablet (2 mg total) by mouth daily. (Patient taking differently: Take 2 mg by mouth at bedtime. ) 30 tablet 11   No current facility-administered medications for this visit.     ROS:  See HPI  Physical Exam:  Filed Vitals:   08/17/14 1548  BP: 116/75  Pulse: 46    Incision:  Well healed with sutures in place Extremities:  2+ left palpable DP pulse   Assessment/Plan:  This is a 59 y.o. female who is s/p: Left 2nd & 3rd toe amputation 07/16/14  -wound well healed.  Will remove sutures today. -her bypass is patent as she has a palpable left DP pulse -still with some swelling of her LLE-continue leg elevation when not ambulating.  This should  improve over the next 3-6 months -refer to biotech for spacer between toes -continue control of blood sugars with PCP -may shower -continue Plavix/Aspirin -f/u in 3 months with duplex of graft and ABI's   Leontine Locket, PA-C [facility] [phone]  Clinic MD:  Pt seen and examined with Dr. Trula Slade  I agree with the above.  I have seen and examined the patient.  She will have her sutures removed today.  She will follow-up in 3 months with a duplex.  [name]

## 2014-08-18 DIAGNOSIS — E1152 Type 2 diabetes mellitus with diabetic peripheral angiopathy with gangrene: Secondary | ICD-10-CM | POA: Diagnosis not present

## 2014-08-18 DIAGNOSIS — I70222 Atherosclerosis of native arteries of extremities with rest pain, left leg: Secondary | ICD-10-CM | POA: Diagnosis not present

## 2014-08-18 DIAGNOSIS — L97522 Non-pressure chronic ulcer of other part of left foot with fat layer exposed: Secondary | ICD-10-CM | POA: Diagnosis not present

## 2014-08-18 DIAGNOSIS — E11621 Type 2 diabetes mellitus with foot ulcer: Secondary | ICD-10-CM | POA: Diagnosis not present

## 2014-08-18 LAB — GLUCOSE, CAPILLARY
GLUCOSE-CAPILLARY: 177 mg/dL — AB (ref 70–99)
GLUCOSE-CAPILLARY: 101 mg/dL — AB (ref 70–99)

## 2014-08-19 DIAGNOSIS — I70222 Atherosclerosis of native arteries of extremities with rest pain, left leg: Secondary | ICD-10-CM | POA: Diagnosis not present

## 2014-08-19 LAB — GLUCOSE, CAPILLARY
Glucose-Capillary: 199 mg/dL — ABNORMAL HIGH (ref 70–99)
Glucose-Capillary: 118 mg/dL — ABNORMAL HIGH (ref 70–99)

## 2014-08-20 DIAGNOSIS — L97522 Non-pressure chronic ulcer of other part of left foot with fat layer exposed: Secondary | ICD-10-CM | POA: Diagnosis not present

## 2014-08-20 DIAGNOSIS — E11621 Type 2 diabetes mellitus with foot ulcer: Secondary | ICD-10-CM | POA: Diagnosis not present

## 2014-08-20 DIAGNOSIS — E1152 Type 2 diabetes mellitus with diabetic peripheral angiopathy with gangrene: Secondary | ICD-10-CM | POA: Diagnosis not present

## 2014-08-20 DIAGNOSIS — I70222 Atherosclerosis of native arteries of extremities with rest pain, left leg: Secondary | ICD-10-CM | POA: Diagnosis not present

## 2014-08-20 LAB — GLUCOSE, CAPILLARY
GLUCOSE-CAPILLARY: 150 mg/dL — AB (ref 70–99)
Glucose-Capillary: 243 mg/dL — ABNORMAL HIGH (ref 70–99)

## 2014-08-21 ENCOUNTER — Telehealth: Payer: Self-pay | Admitting: *Deleted

## 2014-08-21 NOTE — Telephone Encounter (Signed)
Theresa Harrington called to let us know that the [facility] has started her on Collagen dressing to the area between the left 2nd-3rd toes. She says that she had a little bit of bleeding today when she applied the dressing but it stopped immediately. She is afebrile, no other drainage is noted. She just wanted [place] to be aware and to know that she has a follow up appt with the wound clinic on Monday. She will call us back next week if things change.

## 2014-08-24 ENCOUNTER — Ambulatory Visit: Payer: 59

## 2014-08-24 DIAGNOSIS — I70222 Atherosclerosis of native arteries of extremities with rest pain, left leg: Secondary | ICD-10-CM | POA: Diagnosis not present

## 2014-08-24 DIAGNOSIS — E1152 Type 2 diabetes mellitus with diabetic peripheral angiopathy with gangrene: Secondary | ICD-10-CM | POA: Diagnosis not present

## 2014-08-24 DIAGNOSIS — L97522 Non-pressure chronic ulcer of other part of left foot with fat layer exposed: Secondary | ICD-10-CM | POA: Diagnosis not present

## 2014-08-24 DIAGNOSIS — E11621 Type 2 diabetes mellitus with foot ulcer: Secondary | ICD-10-CM | POA: Diagnosis not present

## 2014-08-24 LAB — GLUCOSE, CAPILLARY
GLUCOSE-CAPILLARY: 81 mg/dL (ref 70–99)
Glucose-Capillary: 127 mg/dL — ABNORMAL HIGH (ref 70–99)

## 2014-08-24 NOTE — Addendum Note (Signed)
Addended by: Mena Goes on: 08/24/2014 04:01 PM   Modules accepted: Orders

## 2014-08-25 DIAGNOSIS — L97522 Non-pressure chronic ulcer of other part of left foot with fat layer exposed: Secondary | ICD-10-CM | POA: Diagnosis not present

## 2014-08-25 DIAGNOSIS — E1152 Type 2 diabetes mellitus with diabetic peripheral angiopathy with gangrene: Secondary | ICD-10-CM | POA: Diagnosis not present

## 2014-08-25 DIAGNOSIS — I70222 Atherosclerosis of native arteries of extremities with rest pain, left leg: Secondary | ICD-10-CM | POA: Diagnosis not present

## 2014-08-25 DIAGNOSIS — E11621 Type 2 diabetes mellitus with foot ulcer: Secondary | ICD-10-CM | POA: Diagnosis not present

## 2014-08-25 LAB — GLUCOSE, CAPILLARY
GLUCOSE-CAPILLARY: 130 mg/dL — AB (ref 70–99)
Glucose-Capillary: 197 mg/dL — ABNORMAL HIGH (ref 70–99)

## 2014-08-26 DIAGNOSIS — I70222 Atherosclerosis of native arteries of extremities with rest pain, left leg: Secondary | ICD-10-CM | POA: Diagnosis not present

## 2014-08-26 LAB — GLUCOSE, CAPILLARY
GLUCOSE-CAPILLARY: 224 mg/dL — AB (ref 70–99)
Glucose-Capillary: 123 mg/dL — ABNORMAL HIGH (ref 70–99)

## 2014-08-27 DIAGNOSIS — L97522 Non-pressure chronic ulcer of other part of left foot with fat layer exposed: Secondary | ICD-10-CM | POA: Diagnosis not present

## 2014-08-27 DIAGNOSIS — E11621 Type 2 diabetes mellitus with foot ulcer: Secondary | ICD-10-CM | POA: Diagnosis not present

## 2014-08-27 DIAGNOSIS — E1152 Type 2 diabetes mellitus with diabetic peripheral angiopathy with gangrene: Secondary | ICD-10-CM | POA: Diagnosis not present

## 2014-08-27 DIAGNOSIS — I70222 Atherosclerosis of native arteries of extremities with rest pain, left leg: Secondary | ICD-10-CM | POA: Diagnosis not present

## 2014-08-27 LAB — GLUCOSE, CAPILLARY
Glucose-Capillary: 206 mg/dL — ABNORMAL HIGH (ref 70–99)
Glucose-Capillary: 132 mg/dL — ABNORMAL HIGH (ref 70–99)

## 2014-08-31 ENCOUNTER — Encounter (HOSPITAL_BASED_OUTPATIENT_CLINIC_OR_DEPARTMENT_OTHER): Payer: 59

## 2014-09-17 ENCOUNTER — Encounter: Payer: Self-pay | Admitting: Internal Medicine

## 2014-09-17 ENCOUNTER — Ambulatory Visit (INDEPENDENT_AMBULATORY_CARE_PROVIDER_SITE_OTHER): Payer: 59 | Admitting: Internal Medicine

## 2014-09-17 VITALS — BP 120/66 | HR 61 | Temp 97.6°F | Resp 16 | Ht 65.0 in | Wt 173.0 lb

## 2014-09-17 DIAGNOSIS — G63 Polyneuropathy in diseases classified elsewhere: Secondary | ICD-10-CM

## 2014-09-17 DIAGNOSIS — E538 Deficiency of other specified B group vitamins: Secondary | ICD-10-CM

## 2014-09-17 DIAGNOSIS — E1151 Type 2 diabetes mellitus with diabetic peripheral angiopathy without gangrene: Secondary | ICD-10-CM

## 2014-09-17 DIAGNOSIS — I10 Essential (primary) hypertension: Secondary | ICD-10-CM

## 2014-09-17 MED ORDER — GLYBURIDE 5 MG PO TABS
5.0000 mg | ORAL_TABLET | Freq: Every day | ORAL | Status: DC
Start: 2014-09-17 — End: 2015-03-29

## 2014-09-17 MED ORDER — CYANOCOBALAMIN 1000 MCG/ML IJ SOLN
1000.0000 ug | Freq: Once | INTRAMUSCULAR | Status: AC
  Administered 2014-09-17: 1000 ug via INTRAMUSCULAR

## 2014-09-17 NOTE — Progress Notes (Signed)
Subjective:  Patient ID: Theresa Harrington, female    DOB: 1956/01/05  Age: 59 y.o. MRN: [id number]  CC: Diabetes   HPI Theresa Harrington presents for concerns about blood sugar being consistently above 200 for the last few weeks, she has been treating it with a higher insulin dose and he restarted taking glyburide. She feels well and offers no complaints today.  Outpatient Prescriptions Prior to Visit  Medication Sig Dispense Refill  . aspirin EC 325 MG tablet Take 325 mg by mouth daily.    . Canagliflozin-Metformin HCl [phone] MG TABS Take 1 tablet by mouth 2 (two) times daily. 180 tablet 1  . clopidogrel (PLAVIX) 75 MG tablet Take 1 tablet (75 mg total) by mouth daily. 30 tablet 3  . ezetimibe (ZETIA) 10 MG tablet Take 1 tablet (10 mg total) by mouth daily. 90 tablet 3  . Ferrous Sulfate (IRON) 325 (65 FE) MG TABS Take 325 mg by mouth daily.     [name] gabapentin (NEURONTIN) 300 MG capsule Take 1 capsule (300 mg total) by mouth 3 (three) times daily. 90 capsule 11  . Insulin Detemir (LEVEMIR FLEXTOUCH) 100 UNIT/ML Pen Inject 40 Units into the skin daily at 10 pm. DX 250.72 15 mL 11  . Insulin Pen Needle (NOVOFINE PLUS) 32G X 4 MM MISC Use to inject once a day Dx 250.00 100 each 3  . Magnesium Oxide 400 MG CAPS Take 1 capsule (400 mg total) by mouth daily. 90 capsule 3  . pantoprazole (PROTONIX) 40 MG tablet TAKE ONE TABLET BY MOUTH ONCE DAILY 90 tablet 3  . Pitavastatin Calcium (LIVALO) 2 MG TABS Take 1 tablet (2 mg total) by mouth daily. (Patient taking differently: Take 2 mg by mouth at bedtime. ) 30 tablet 11   No facility-administered medications prior to visit.    ROS Review of Systems  Constitutional: Negative.  Negative for fever, chills, diaphoresis, appetite change and fatigue.  HENT: Negative.   Eyes: Negative.   Respiratory: Negative.  Negative for choking, chest tightness, shortness of breath and stridor.   Cardiovascular: Negative.  Negative for chest pain, palpitations and leg  swelling.  Gastrointestinal: Negative.  Negative for nausea, vomiting, abdominal pain, diarrhea, constipation and blood in stool.  Endocrine: Negative.  Negative for polydipsia, polyphagia and polyuria.  Genitourinary: Negative.  Negative for difficulty urinating.  Musculoskeletal: Negative.  Negative for myalgias, back pain, joint swelling and arthralgias.  Skin: Negative for rash.  Allergic/Immunologic: Negative.   Neurological: Negative.   Hematological: Negative.  Negative for adenopathy. Does not bruise/bleed easily.  Psychiatric/Behavioral: Negative.     Objective:  BP 120/66 mmHg  Pulse 61  Temp(Src) 97.6 F (36.4 C) (Oral)  Resp 16  Ht 5\' 5"  (1.651 m)  Wt 173 lb (78.472 kg)  BMI 28.79 kg/m2  SpO2 97%  BP Readings from Last 3 Encounters:  09/17/14 120/66  08/17/14 116/75  08/04/14 122/60    Wt Readings from Last 3 Encounters:  09/17/14 173 lb (78.472 kg)  08/17/14 175 lb (79.379 kg)  08/04/14 172 lb (78.019 kg)    Physical Exam  Constitutional: She is oriented to person, place, and time. She appears well-developed and well-nourished. No distress.  HENT:  Head: Normocephalic and atraumatic.  Mouth/Throat: Oropharynx is clear and moist. No oropharyngeal exudate.  Eyes: Conjunctivae are normal. Right eye exhibits no discharge. Left eye exhibits no discharge. No scleral icterus.  Neck: Normal range of motion. Neck supple. No JVD present. No tracheal deviation present. No thyromegaly present.  Cardiovascular: Normal rate, regular rhythm, normal heart sounds and intact distal pulses.  Exam reveals no gallop and no friction rub.   No murmur heard. Pulmonary/Chest: Effort normal and breath sounds normal. No stridor. No respiratory distress. She has no wheezes. She has no rales. She exhibits no tenderness.  Abdominal: Soft. Bowel sounds are normal. She exhibits no distension and no mass. There is no tenderness. There is no rebound and no guarding.  Musculoskeletal: Normal  range of motion. She exhibits no edema or tenderness.  Lymphadenopathy:    She has no cervical adenopathy.  Neurological: She is oriented to person, place, and time.  Skin: Skin is warm and dry. No rash noted. She is not diaphoretic. No erythema. No pallor.  Vitals reviewed.   Lab Results  Component Value Date   WBC 8.5 08/04/2014   HGB 12.7 08/04/2014   HCT 38.3 08/04/2014   PLT 314.0 08/04/2014   GLUCOSE 134* 08/04/2014   CHOL 227* 02/05/2014   TRIG 140.0 02/05/2014   HDL 46.40 02/05/2014   LDLDIRECT 160.2 03/01/2010   LDLCALC 153* 02/05/2014   ALT 18 07/16/2014   AST 18 07/16/2014   NA 138 08/04/2014   K 4.2 08/04/2014   CL 102 08/04/2014   CREATININE 0.98 08/04/2014   BUN 18 08/04/2014   CO2 27 08/04/2014   TSH 0.596 06/15/2014   INR 1.05 07/16/2014   HGBA1C 8.6* 08/04/2014   MICROALBUR 3.5* 03/01/2010    No results found.  Assessment & Plan:   Theresa Harrington was seen today for diabetes.  Diagnoses and all orders for this visit:  Diabetes mellitus with peripheral vascular disease Orders: -     glyBURIDE (DIABETA) 5 MG tablet; Take 1 tablet (5 mg total) by mouth daily with breakfast. -     Ambulatory referral to Ophthalmology  Vitamin B12 deficiency neuropathy Orders: -     cyanocobalamin ((VITAMIN B-12)) injection 1,000 mcg; Inject 1 mL (1,000 mcg total) into the muscle once.  Essential hypertension, benign - her BP is well controlled   I am having Theresa Harrington start on glyBURIDE. I am also having her maintain her Iron, Pitavastatin Calcium, Insulin Pen Needle, ezetimibe, Magnesium Oxide, gabapentin, clopidogrel, aspirin EC, pantoprazole, Insulin Detemir, Canagliflozin-Metformin HCl, ALPRAZolam, and losartan-hydrochlorothiazide. We administered cyanocobalamin.  Meds ordered this encounter  Medications  . ALPRAZolam (XANAX) 0.5 MG tablet    Sig:   . losartan-hydrochlorothiazide (HYZAAR) 100-12.5 MG per tablet    Sig:   . glyBURIDE (DIABETA) 5 MG tablet    Sig:  Take 1 tablet (5 mg total) by mouth daily with breakfast.    Dispense:  90 tablet    Refill:  1  . cyanocobalamin ((VITAMIN B-12)) injection 1,000 mcg    Sig:      Follow-up: Return in about 3 months (around 12/18/2014).  Scarlette Calico, MD

## 2014-09-17 NOTE — Patient Instructions (Signed)

## 2014-09-17 NOTE — Progress Notes (Signed)
Pre visit review using our clinic review tool, if applicable. No additional management support is needed unless otherwise documented below in the visit note. 

## 2014-09-29 ENCOUNTER — Telehealth: Payer: Self-pay | Admitting: Internal Medicine

## 2014-09-29 NOTE — Telephone Encounter (Signed)
[employer] form completed, signed and faxed to company to process and contact patient.

## 2014-09-29 NOTE — Telephone Encounter (Signed)
Patient is requesting compass referral to: Dr. Katy Fitch eye [facility] - July 1st at 10:30

## 2014-09-29 NOTE — Telephone Encounter (Signed)
Would like to have a script for cologuard.

## 2014-10-01 NOTE — Telephone Encounter (Signed)
[employer] [facility]ass ref# [id number] valid 10/01/14-04/02/15 for 6 visits

## 2014-10-30 ENCOUNTER — Other Ambulatory Visit: Payer: Self-pay | Admitting: Vascular Surgery

## 2014-11-12 ENCOUNTER — Encounter: Payer: Self-pay | Admitting: Surgery

## 2014-11-16 ENCOUNTER — Ambulatory Visit (INDEPENDENT_AMBULATORY_CARE_PROVIDER_SITE_OTHER): Payer: 59 | Admitting: Surgery

## 2014-11-16 ENCOUNTER — Ambulatory Visit (HOSPITAL_COMMUNITY)
Admission: RE | Admit: 2014-11-16 | Discharge: 2014-11-16 | Disposition: A | Discharge status: Home / Self Care | Payer: 59 | Source: Ambulatory Visit | Attending: Surgery | Admitting: Surgery

## 2014-11-16 ENCOUNTER — Ambulatory Visit (INDEPENDENT_AMBULATORY_CARE_PROVIDER_SITE_OTHER)
Admission: RE | Admit: 2014-11-16 | Discharge: 2014-11-16 | Disposition: A | Discharge status: Home / Self Care | Payer: 59 | Source: Ambulatory Visit | Attending: Surgery | Admitting: Surgery

## 2014-11-16 ENCOUNTER — Encounter: Payer: Self-pay | Admitting: Surgery

## 2014-11-16 VITALS — BP 133/72 | HR 54 | Temp 97.7°F | Resp 16 | Ht 65.0 in | Wt 173.0 lb

## 2014-11-16 DIAGNOSIS — I739 Peripheral vascular disease, unspecified: Secondary | ICD-10-CM

## 2014-11-16 DIAGNOSIS — E785 Hyperlipidemia, unspecified: Secondary | ICD-10-CM | POA: Insufficient documentation

## 2014-11-16 DIAGNOSIS — Z95828 Presence of other vascular implants and grafts: Secondary | ICD-10-CM | POA: Diagnosis not present

## 2014-11-16 DIAGNOSIS — Z48812 Encounter for surgical aftercare following surgery on the circulatory system: Secondary | ICD-10-CM | POA: Diagnosis not present

## 2014-11-16 DIAGNOSIS — I10 Essential (primary) hypertension: Secondary | ICD-10-CM | POA: Insufficient documentation

## 2014-11-16 DIAGNOSIS — L97509 Non-pressure chronic ulcer of other part of unspecified foot with unspecified severity: Secondary | ICD-10-CM | POA: Diagnosis not present

## 2014-11-16 DIAGNOSIS — I70299 Other atherosclerosis of native arteries of extremities, unspecified extremity: Secondary | ICD-10-CM | POA: Diagnosis not present

## 2014-11-16 DIAGNOSIS — E119 Type 2 diabetes mellitus without complications: Secondary | ICD-10-CM | POA: Insufficient documentation

## 2014-11-16 DIAGNOSIS — I70202 Unspecified atherosclerosis of native arteries of extremities, left leg: Secondary | ICD-10-CM | POA: Insufficient documentation

## 2014-11-16 DIAGNOSIS — Z89422 Acquired absence of other left toe(s): Secondary | ICD-10-CM | POA: Insufficient documentation

## 2014-11-16 NOTE — Addendum Note (Signed)
Addended by: Dorthula Rue L on: 11/16/2014 02:30 PM   Modules accepted: Orders

## 2014-11-16 NOTE — Progress Notes (Signed)
Patient name: Theresa Harrington MRN: [id number] DOB: [birth year] Sex: female     Chief Complaint  Patient presents with  . PVD    3 month follow up     HISTORY OF PRESENT ILLNESS: This is a 59 y.o. female who is initially underwent left femoral-popliteal bypass graft by Dr. early on 02/06/2015. Unfortunately this occluded early. I took her back to the operating room on 06/09/2014 and redid her operation with Gore-Tex, 6 mm propatent. She was ultimately discharged from the hospital on IV antibiotics. She then began hyperbaric oxygen treatment. She ultimately developed necrotic changes to the left second toe. 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   She has complained of left leg swelling which has gotten better over time.  She is wearing a spacer at her toe amputation site.     Past Medical History  Diagnosis Date  . Hyperlipidemia   . Hypertension   . PONV (postoperative nausea and vomiting)   . Right ureteral stone   . PAD (peripheral artery disease)   . Atherosclerosis of aorta   . GERD (gastroesophageal reflux disease)   . Asymptomatic cholelithiasis   . Peripheral arterial occlusive disease     lower extremities  . Wears glasses   . Hypothyroidism     no meds  . Kidney stones   . Type 2 diabetes mellitus   . Anemia   . Depression   . Anxiety   . Pneumonia     Past Surgical History  Procedure Laterality Date  . Belpharoptosis repair      eyelid lift  . Cystoscopy with stent placement Right 06/04/2013    Procedure: CYSTOSCOPY WITH STENT PLACEMENT;  Surgeon: Franchot Gallo, MD;  Location: [facility];  Service: Urology;  Laterality: Right;  . Right knee patellectomy w/ repair of extensor mechanism  04-14-2002  . Orif fifth metacarpal fx  right hand  04-21-2002  . Laparoscopic gastric banding  05-29-2005  . Revision and re-siting lap-band port  04-08-2010    W/  UPPER EGD  . Combined augmentation mammaplasty and abdominoplasty  2009    W/  BILATERAL   THIGH LIFT  . Cysto/  right ureteral stent placement  12-30-2010  . Extracorporeal shock wave lithotripsy Right 08-04-2013//   06-23-2013//   01-16-2011  . Cystoscopy with retrograde pyelogram, ureteroscopy and stent placement Right 10/07/2013    Procedure: CYSTOSCOPY WITH RETROGRADE PYELOGRAM, right URETEROSCOPY AND STENT PLACEMENT, stone extraction;  Surgeon: Arvil Persons, MD;  Location: [facility];  Service: Urology;  Laterality: Right;  . Holmium laser application Right 12/03/1658    Procedure: HOLMIUM LASER APPLICATION;  Surgeon: Arvil Persons, MD;  Location: [facility];  Service: Urology;  Laterality: Right;  . Kidney stone surgery  April 2015    1-2 stones  . Toenail removed Left Jan. 21, 2016    2nd toenail-  Dr. Barkley Bruns  . Augmentation mammaplasty    . Lithotripsy  2-3 times  . Lower extremity angiogram N/A 06/04/2014    Procedure: LOWER EXTREMITY ANGIOGRAM;  Surgeon: Serafina Mitchell, MD;  Location: [facility];  Service: Cardiovascular;  Laterality: N/A;  . Endarterectomy femoral Left 06/08/2014    Procedure: Left Leg Common Femoral and External Iliac  Endartarectomy with patch Angioplasty;  Surgeon: Rosetta Posner, MD;  Location: [place];  Service: Vascular;  Laterality: Left;  . Femoral-popliteal bypass graft Left 06/08/2014    Procedure: Left Leg Femoral -Popliteal Bypass Graft;  Surgeon: Rosetta Posner, MD;  Location: [place];  Service: Vascular;  Laterality: Left;  . Femoral-popliteal bypass graft Left 06/09/2014    Procedure: Left Femoral and Popliteal Exposure; Left Femoral to Anterior Tibial Bypass Graft using Propaten 57mm by 80cm Goretex Graft; Left Tibial Endarterectomy; Left Femoraland Popliteal Thrombectomy ;  Surgeon: Serafina Mitchell, MD;  Location: [place];  Service: Vascular;  Laterality: Left;  . Amputation Left 07/16/2014    Procedure: LEFT SECOND TOE AMPUTATION;  Surgeon: Serafina Mitchell, MD;  Location: [place];  Service: Vascular;  Laterality: Left;  With Nerve  block    History   Social History  . Marital Status: Single    Spouse Name: N/A  . Number of Children: N/A  . Years of Education: N/A   Occupational History  . tester    Social History Main Topics  . Smoking status: Former Smoker -- 1.00 packs/day for 28 years    Types: Cigarettes    Quit date: 09/05/2001  . Smokeless tobacco: Never Used  . Alcohol Use: No  . Drug Use: No  . Sexual Activity: Yes   Other Topics Concern  . Not on file   Social History Narrative   Regular exercise- yes    Family History  Problem Relation Age of Onset  . Arthritis Other   . Cancer Other     colon  . Hypertension Other   . Stroke Other   . Diabetes Father   . Heart disease Father   . Deep vein thrombosis Father   . Hyperlipidemia Father   . Diabetes Sister   . Heart disease Sister   . Deep vein thrombosis Sister   . Hyperlipidemia Sister   . Diabetes Brother   . Heart disease Brother   . Cancer Brother   . Hyperlipidemia Brother   . Colon cancer Maternal Aunt   . Diabetes Brother   . Diabetes Brother   . Kidney disease Mother   . Hyperlipidemia Mother   . Other Mother     AAA   and    Amputation    Allergies as of 11/16/2014 - Review Complete 11/16/2014  Allergen Reaction Noted  . Amlodipine Other (See Comments) 11/26/2013  . Atorvastatin Other (See Comments) 10/28/2013  . Ampicillin Nausea And Vomiting 04/12/2011  . Codeine Nausea And Vomiting   . Lisinopril Other (See Comments) 10/05/2008  . Penicillins Nausea And Vomiting 05/01/2011  . Januvia [sitagliptin]  12/21/2011    Current Outpatient Prescriptions on File Prior to Visit  Medication Sig Dispense Refill  . ALPRAZolam (XANAX) 0.5 MG tablet     . aspirin EC 325 MG tablet Take 325 mg by mouth daily.    . Canagliflozin-Metformin HCl [phone] MG TABS Take 1 tablet by mouth 2 (two) times daily. 180 tablet 1  . clopidogrel (PLAVIX) 75 MG tablet TAKE ONE TABLET BY MOUTH ONCE DAILY 30 tablet 6  . ezetimibe (ZETIA)  10 MG tablet Take 1 tablet (10 mg total) by mouth daily. 90 tablet 3  . Ferrous Sulfate (IRON) 325 (65 FE) MG TABS Take 325 mg by mouth daily.     [name] gabapentin (NEURONTIN) 300 MG capsule Take 1 capsule (300 mg total) by mouth 3 (three) times daily. 90 capsule 11  . glyBURIDE (DIABETA) 5 MG tablet Take 1 tablet (5 mg total) by mouth daily with breakfast. 90 tablet 1  . Insulin Detemir (LEVEMIR FLEXTOUCH) 100 UNIT/ML Pen Inject 40 Units into the skin daily at 10 pm. DX  250.72 15 mL 11  . Insulin Pen Needle (NOVOFINE PLUS) 32G X 4 MM MISC Use to inject once a day Dx 250.00 100 each 3  . losartan-hydrochlorothiazide (HYZAAR) 100-12.5 MG per tablet     . Magnesium Oxide 400 MG CAPS Take 1 capsule (400 mg total) by mouth daily. 90 capsule 3  . pantoprazole (PROTONIX) 40 MG tablet TAKE ONE TABLET BY MOUTH ONCE DAILY 90 tablet 3  . Pitavastatin Calcium (LIVALO) 2 MG TABS Take 1 tablet (2 mg total) by mouth daily. (Patient taking differently: Take 2 mg by mouth at bedtime. ) 30 tablet 11   No current facility-administered medications on file prior to visit.     REVIEW OF SYSTEMS: Cardiovascular: No chest pain, chest pressure, palpitations, orthopnea, or dyspnea on exertion. No claudication or rest pain,  No history of DVT or phlebitis. Pulmonary: No productive cough, asthma or wheezing. Neurologic: No weakness, paresthesias, aphasia, or amaurosis. No dizziness. Hematologic: No bleeding problems or clotting disorders. Musculoskeletal: No joint pain or joint swelling. Gastrointestinal: No blood in stool or hematemesis Genitourinary: No dysuria or hematuria. Psychiatric:: No history of major depression. Integumentary: No rashes or ulcers. Constitutional: No fever or chills.  PHYSICAL EXAMINATION:   Vital signs are  Filed Vitals:   11/16/14 1348  BP: 133/72  Pulse: 54  Temp: 97.7 F (36.5 C)  TempSrc: Oral  Resp: 16  Height: 5\' 5"  (1.651 m)  Weight: 173 lb (78.472 kg)  SpO2: 100%   Body  mass index is 28.79 kg/(m^2). General: The patient appears their stated age. HEENT:  No gross abnormalities Pulmonary:  Non labored breathing Musculoskeletal: There are no major deformities. Neurologic: No focal weakness or paresthesias are detected, Skin:  Left second toe amputation site is completely healed. Psychiatric: The patient has normal affect. Cardiovascular: There is a regular rate and rhythm without significant murmur appreciated.  pedal pulses not palpable  Diagnostic Studies  I have reviewed her vascular lab studies today.  She has an ABI of 0.9 on the left and 0.55 on the right.  Duplex shows elevated velocities in the tibioperoneal trunk.  The distal anastomosis and proximal anastomosis are widely patent.  Assessment:  atherosclerosis with ulcer , status post left femoral to below-knee popliteal bypass graft Plan:  the patient's incisions have completely healed.  Her dilatation site has completely healed. Overall, the patient is doing very well.  Her biggest complaint is that of swelling which has significantly improved over the course of the past 3-4 months. It is safe for her to go into compression stockings.   Ultrasound shows elevated velocities in the outflow tract which is the native artery.  I have elected to monitor this.  She will have a repeat study in 6 months.  This has significantly changed she may need angiography.  Eldridge Abrahams, M.D. [facility] Office: [phone] Pager:  [phone]

## 2014-11-18 ENCOUNTER — Ambulatory Visit (INDEPENDENT_AMBULATORY_CARE_PROVIDER_SITE_OTHER): Payer: 59

## 2014-11-18 DIAGNOSIS — E538 Deficiency of other specified B group vitamins: Secondary | ICD-10-CM

## 2014-11-18 DIAGNOSIS — G63 Polyneuropathy in diseases classified elsewhere: Principal | ICD-10-CM

## 2014-11-18 MED ORDER — CYANOCOBALAMIN 1000 MCG/ML IJ SOLN
1000.0000 ug | Freq: Once | INTRAMUSCULAR | Status: AC
  Administered 2014-11-18: 1000 ug via SUBCUTANEOUS

## 2014-12-14 ENCOUNTER — Other Ambulatory Visit: Payer: Self-pay | Admitting: Internal Medicine

## 2014-12-30 ENCOUNTER — Ambulatory Visit (INDEPENDENT_AMBULATORY_CARE_PROVIDER_SITE_OTHER): Payer: 59

## 2014-12-30 DIAGNOSIS — E538 Deficiency of other specified B group vitamins: Secondary | ICD-10-CM

## 2014-12-30 MED ORDER — CYANOCOBALAMIN 1000 MCG/ML IJ SOLN
1000.0000 ug | Freq: Once | INTRAMUSCULAR | Status: AC
  Administered 2014-12-30: 1000 ug via INTRAMUSCULAR

## 2015-01-13 ENCOUNTER — Telehealth: Payer: Self-pay

## 2015-01-13 NOTE — Telephone Encounter (Signed)
Phone call from pt.  Reported increased pain in left leg with activity this week.  Reported the pain starts with any activity, and subsides with rest.  Denied rest pain.  Denied any open sores of Left LE.  Denied swelling of left LE.  Advised will have a Scheduler call back with appt. options.  Enc. To call if symptoms worsen prior to appt.  Verb. understanding.

## 2015-01-21 ENCOUNTER — Other Ambulatory Visit: Payer: Self-pay | Admitting: *Deleted

## 2015-01-21 DIAGNOSIS — I739 Peripheral vascular disease, unspecified: Secondary | ICD-10-CM

## 2015-01-21 DIAGNOSIS — M79605 Pain in left leg: Secondary | ICD-10-CM

## 2015-01-26 ENCOUNTER — Other Ambulatory Visit: Payer: Self-pay | Admitting: Internal Medicine

## 2015-01-26 DIAGNOSIS — I739 Peripheral vascular disease, unspecified: Secondary | ICD-10-CM

## 2015-01-27 ENCOUNTER — Other Ambulatory Visit (INDEPENDENT_AMBULATORY_CARE_PROVIDER_SITE_OTHER): Payer: 59

## 2015-01-27 ENCOUNTER — Encounter: Payer: Self-pay | Admitting: Internal Medicine

## 2015-01-27 ENCOUNTER — Ambulatory Visit (INDEPENDENT_AMBULATORY_CARE_PROVIDER_SITE_OTHER): Payer: 59 | Admitting: Internal Medicine

## 2015-01-27 VITALS — BP 132/80 | HR 72 | Temp 97.8°F | Resp 16 | Ht 65.0 in | Wt 173.0 lb

## 2015-01-27 DIAGNOSIS — N200 Calculus of kidney: Secondary | ICD-10-CM

## 2015-01-27 DIAGNOSIS — I10 Essential (primary) hypertension: Secondary | ICD-10-CM

## 2015-01-27 DIAGNOSIS — Z0000 Encounter for general adult medical examination without abnormal findings: Secondary | ICD-10-CM

## 2015-01-27 DIAGNOSIS — Z23 Encounter for immunization: Secondary | ICD-10-CM

## 2015-01-27 DIAGNOSIS — E1151 Type 2 diabetes mellitus with diabetic peripheral angiopathy without gangrene: Secondary | ICD-10-CM | POA: Diagnosis not present

## 2015-01-27 DIAGNOSIS — D509 Iron deficiency anemia, unspecified: Secondary | ICD-10-CM

## 2015-01-27 DIAGNOSIS — G63 Polyneuropathy in diseases classified elsewhere: Secondary | ICD-10-CM

## 2015-01-27 DIAGNOSIS — E785 Hyperlipidemia, unspecified: Secondary | ICD-10-CM

## 2015-01-27 DIAGNOSIS — E538 Deficiency of other specified B group vitamins: Secondary | ICD-10-CM

## 2015-01-27 DIAGNOSIS — N390 Urinary tract infection, site not specified: Secondary | ICD-10-CM

## 2015-01-27 DIAGNOSIS — Z1231 Encounter for screening mammogram for malignant neoplasm of breast: Secondary | ICD-10-CM

## 2015-01-27 DIAGNOSIS — Z124 Encounter for screening for malignant neoplasm of cervix: Secondary | ICD-10-CM

## 2015-01-27 LAB — URINALYSIS, ROUTINE W REFLEX MICROSCOPIC
BILIRUBIN URINE: NEGATIVE
Ketones, ur: NEGATIVE
NITRITE: NEGATIVE
PH: 6 (ref 5.0–8.0)
Specific Gravity, Urine: 1.025 (ref 1.000–1.030)
TOTAL PROTEIN, URINE-UPE24: 30 — AB
UROBILINOGEN UA: 0.2 (ref 0.0–1.0)
Urine Glucose: >=1000 — AB

## 2015-01-27 LAB — MICROALBUMIN / CREATININE URINE RATIO
Creatinine,U: 113.4 mg/dL
MICROALB/CREAT RATIO: 27.2 mg/g (ref 0.0–30.0)
Microalb, Ur: 30.9 mg/dL — ABNORMAL HIGH (ref 0.0–1.9)

## 2015-01-27 LAB — COMPREHENSIVE METABOLIC PANEL
ALBUMIN: 4.1 g/dL (ref 3.5–5.2)
ALT: 10 U/L (ref 0–35)
AST: 12 U/L (ref 0–37)
Alkaline Phosphatase: 59 U/L (ref 39–117)
BUN: 16 mg/dL (ref 6–23)
CALCIUM: 9.9 mg/dL (ref 8.4–10.5)
CHLORIDE: 102 meq/L (ref 96–112)
CO2: 30 mEq/L (ref 19–32)
Creatinine, Ser: 0.88 mg/dL (ref 0.40–1.20)
GFR: 84.9 mL/min (ref >60.00)
Glucose, Bld: 145 mg/dL — ABNORMAL HIGH (ref 70–99)
POTASSIUM: 4.4 meq/L (ref 3.5–5.1)
Sodium: 140 mEq/L (ref 135–145)
Total Bilirubin: 0.4 mg/dL (ref 0.2–1.2)
Total Protein: 7.3 g/dL (ref 6.0–8.3)

## 2015-01-27 LAB — CBC WITH DIFFERENTIAL/PLATELET
BASOS PCT: 0.6 % (ref 0.0–3.0)
Basophils Absolute: 0.1 10*3/uL (ref 0.0–0.1)
EOS PCT: 1.1 % (ref 0.0–5.0)
Eosinophils Absolute: 0.1 10*3/uL (ref 0.0–0.7)
HCT: 41.7 % (ref 36.0–46.0)
HEMOGLOBIN: 13.8 g/dL (ref 12.0–15.0)
LYMPHS PCT: 29.2 % (ref 12.0–46.0)
Lymphs Abs: 2.5 10*3/uL (ref 0.7–4.0)
MCHC: 33.1 g/dL (ref 30.0–36.0)
MCV: 85.4 fl (ref 78.0–100.0)
Monocytes Absolute: 0.5 10*3/uL (ref 0.1–1.0)
Monocytes Relative: 5.3 % (ref 3.0–12.0)
Neutro Abs: 5.5 10*3/uL (ref 1.4–7.7)
Neutrophils Relative %: 63.8 % (ref 43.0–77.0)
Platelets: 271 10*3/uL (ref 150.0–400.0)
RBC: 4.88 Mil/uL (ref 3.87–5.11)
RDW: 14.6 % (ref 11.5–15.5)
WBC: 8.6 10*3/uL (ref 4.0–10.5)

## 2015-01-27 LAB — LIPID PANEL
Cholesterol: 144 mg/dL (ref 0–200)
HDL: 44.4 mg/dL (ref >39.00)
LDL Cholesterol: 75 mg/dL (ref 0–99)
NonHDL: 99.47
TRIGLYCERIDES: 124 mg/dL (ref 0.0–149.0)
Total CHOL/HDL Ratio: 3
VLDL: 24.8 mg/dL (ref 0.0–40.0)

## 2015-01-27 LAB — TSH: TSH: 0.24 u[IU]/mL — AB (ref 0.35–4.50)

## 2015-01-27 LAB — HEMOGLOBIN A1C: Hgb A1c MFr Bld: 8.5 % — ABNORMAL HIGH (ref 4.6–6.5)

## 2015-01-27 MED ORDER — CYANOCOBALAMIN 1000 MCG/ML IJ SOLN
1000.0000 ug | Freq: Once | INTRAMUSCULAR | Status: AC
  Administered 2015-01-27: 1000 ug via INTRAMUSCULAR

## 2015-01-27 NOTE — Progress Notes (Signed)
Pre visit review using our clinic review tool, if applicable. No additional management support is needed unless otherwise documented below in the visit note. 

## 2015-01-27 NOTE — Progress Notes (Signed)
Subjective:  Patient ID: Theresa Harrington, female    DOB: 01/10/1956  Age: 59 y.o. MRN: [id number]  CC: Annual Exam; Diabetes; Urinary Tract Infection; Hyperlipidemia; and Hypertension   HPI Theresa Harrington presents for a complete physical and follow-up on her multiple medical problems. She complains  for the last few weeks that her urine has been more frequent than usual. She does not report any abdominal or flank pain. She is not aware of any dysuria or hematuria. She denies fever, chills, nausea, vomiting, diarrhea. She does not want me to do her pelvic exam and Pap smear. She requests a referral to GYN.  Outpatient Prescriptions Prior to Visit  Medication Sig Dispense Refill  . ALPRAZolam (XANAX) 0.5 MG tablet     . aspirin EC 325 MG tablet Take 325 mg by mouth daily.    . Canagliflozin-Metformin HCl [phone] MG TABS Take 1 tablet by mouth 2 (two) times daily. 180 tablet 1  . clopidogrel (PLAVIX) 75 MG tablet TAKE ONE TABLET BY MOUTH ONCE DAILY 30 tablet 6  . ezetimibe (ZETIA) 10 MG tablet Take 1 tablet (10 mg total) by mouth daily. 90 tablet 3  . Ferrous Sulfate (IRON) 325 (65 FE) MG TABS Take 325 mg by mouth daily.     [name] gabapentin (NEURONTIN) 300 MG capsule Take 1 capsule (300 mg total) by mouth 3 (three) times daily. 90 capsule 11  . glyBURIDE (DIABETA) 5 MG tablet Take 1 tablet (5 mg total) by mouth daily with breakfast. 90 tablet 1  . Insulin Pen Needle (NOVOFINE PLUS) 32G X 4 MM MISC Use to inject once a day Dx 250.00 100 each 3  . LIVALO 2 MG TABS TAKE ONE TABLET BY MOUTH ONCE DAILY 90 tablet 3  . losartan-hydrochlorothiazide (HYZAAR) 100-12.5 MG per tablet TAKE ONE TABLET BY MOUTH ONCE DAILY 90 tablet 2  . Magnesium Oxide 400 MG CAPS Take 1 capsule (400 mg total) by mouth daily. 90 capsule 3  . pantoprazole (PROTONIX) 40 MG tablet TAKE ONE TABLET BY MOUTH ONCE DAILY 90 tablet 3  . Insulin Detemir (LEVEMIR FLEXTOUCH) 100 UNIT/ML Pen Inject 40 Units into the skin daily at 10 pm. DX 250.72  15 mL 11  . LEVEMIR FLEXTOUCH 100 UNIT/ML Pen INJECT 30 UNITS INTO THE SKIN AT 10PM 15 mL 11  . losartan-hydrochlorothiazide (HYZAAR) 100-12.5 MG per tablet      No facility-administered medications prior to visit.    ROS Review of Systems  Constitutional: Negative.  Negative for fever, chills, diaphoresis, appetite change and fatigue.  HENT: Negative.  Negative for trouble swallowing.   Eyes: Negative.   Respiratory: Negative.  Negative for cough, choking, chest tightness, shortness of breath and stridor.   Cardiovascular: Negative.  Negative for chest pain, palpitations and leg swelling.  Gastrointestinal: Negative.  Negative for nausea, vomiting, abdominal pain, diarrhea, constipation and blood in stool.  Endocrine: Positive for polyuria. Negative for cold intolerance, heat intolerance, polydipsia and polyphagia.  Genitourinary: Positive for frequency. Negative for dysuria, urgency, hematuria, flank pain, decreased urine volume and difficulty urinating.  Musculoskeletal: Negative.  Negative for myalgias and back pain.  Skin: Negative.  Negative for rash.  Allergic/Immunologic: Negative.   Neurological: Negative.  Negative for dizziness, syncope, weakness, light-headedness, numbness and headaches.  Hematological: Negative.  Negative for adenopathy. Does not bruise/bleed easily.  Psychiatric/Behavioral: Negative.     Objective:  BP 132/80 mmHg  Pulse 72  Temp(Src) 97.8 F (36.6 C) (Oral)  Resp 16  Ht 5\' 5"  (1.651  m)  Wt 173 lb (78.472 kg)  BMI 28.79 kg/m2  SpO2 98%  BP Readings from Last 3 Encounters:  01/27/15 132/80  11/16/14 133/72  09/17/14 120/66    Wt Readings from Last 3 Encounters:  01/27/15 173 lb (78.472 kg)  11/16/14 173 lb (78.472 kg)  09/17/14 173 lb (78.472 kg)    Physical Exam  Constitutional: She is oriented to person, place, and time. She appears well-developed and well-nourished.  Non-toxic appearance. She does not have a sickly appearance. She does  not appear ill. No distress.  HENT:  Head: Normocephalic and atraumatic.  Mouth/Throat: Oropharynx is clear and moist. No oropharyngeal exudate.  Eyes: Conjunctivae are normal. Right eye exhibits no discharge. Left eye exhibits no discharge. No scleral icterus.  Neck: Normal range of motion. Neck supple. No JVD present. No tracheal deviation present. No thyromegaly present.  Cardiovascular: Normal rate, regular rhythm, normal heart sounds and intact distal pulses.  Exam reveals no gallop and no friction rub.   No murmur heard. Pulmonary/Chest: Effort normal and breath sounds normal. No stridor. No respiratory distress. She has no wheezes. She has no rales.  Abdominal: Soft. Bowel sounds are normal. She exhibits no distension and no mass. There is no tenderness. There is no rebound and no guarding.  Genitourinary: No breast swelling, tenderness, discharge or bleeding. Pelvic exam was performed with patient supine.  Genitourinary and rectal exams were deferred at her request.  Musculoskeletal: Normal range of motion. She exhibits no edema or tenderness.  Lymphadenopathy:    She has no cervical adenopathy.  Neurological: She is oriented to person, place, and time.  Skin: Skin is warm and dry. No rash noted. She is not diaphoretic. No erythema. No pallor.  Psychiatric: She has a normal mood and affect. Her behavior is normal. Judgment and thought content normal.  Vitals reviewed.   Lab Results  Component Value Date   WBC 8.6 01/27/2015   HGB 13.8 01/27/2015   HCT 41.7 01/27/2015   PLT 271.0 01/27/2015   GLUCOSE 145* 01/27/2015   CHOL 144 01/27/2015   TRIG 124.0 01/27/2015   HDL 44.40 01/27/2015   LDLDIRECT 160.2 03/01/2010   LDLCALC 75 01/27/2015   ALT 10 01/27/2015   AST 12 01/27/2015   NA 140 01/27/2015   K 4.4 01/27/2015   CL 102 01/27/2015   CREATININE 0.88 01/27/2015   BUN 16 01/27/2015   CO2 30 01/27/2015   TSH 0.24* 01/27/2015   INR 1.05 07/16/2014   HGBA1C 8.5*  01/27/2015   MICROALBUR 30.9* 01/27/2015    No results found.  Assessment & Plan:   Zuma was seen today for annual exam, diabetes, urinary tract infection, hyperlipidemia and hypertension.  Diagnoses and all orders for this visit:  Diabetes mellitus with peripheral vascular disease- her blood sugars are not adequately well controlled, she will continue the oral meds that she is currently taking. I will increase her basal insulin from 40 units a day to 60 units a day and ohave asked her to add on 5 units of quick acting insulin with each meal. -     Lipid panel; Future -     Microalbumin / creatinine urine ratio; Future -     Hemoglobin A1c; Future -     Comprehensive metabolic panel; Future -     Amb Referral to Nutrition and Diabetic E -     Ambulatory referral to Ophthalmology  Essential hypertension, benign- her blood sugars adequately well controlled, electrolytes and renal function are  stable. -     Urinalysis, Routine w reflex microscopic (not at [facility]); Future -     Comprehensive metabolic panel; Future  Vitamin B12 deficiency neuropathy- improvement noted -     CBC with Differential/Platelet; Future  Anemia, iron deficiency- improvement noted -     CBC with Differential/Platelet; Future  Hyperlipidemia with target LDL less than 100- she has achieved her LDL goal is doing well on the statin. -     Lipid panel; Future -     TSH; Future -     Comprehensive metabolic panel; Future  Recurrent UTI- her UA shows a moderate amount of red blood cells and white blood cells. Culture is pending. If her culture is positive then will treat for urinary tract infection. 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 -     CULTURE, URINE COMPREHENSIVE; Future  Visit for screening mammogram -     MM DIGITAL SCREENING BILATERAL; Future  Screening for cervical  cancer -     Ambulatory referral to Gynecology   I have discontinued Ms. Job's LEVEMIR [place]. I have also changed her Insulin Detemir. Additionally, I am having her start on Insulin Lispro (Human). Lastly, I am having her maintain her Iron, Insulin Pen Needle, ezetimibe, Magnesium Oxide, gabapentin, aspirin EC, pantoprazole, Canagliflozin-Metformin HCl, ALPRAZolam, glyBURIDE, clopidogrel, losartan-hydrochlorothiazide, and LIVALO. We administered cyanocobalamin.  Meds ordered this encounter  Medications  . cyanocobalamin ((VITAMIN B-12)) injection 1,000 mcg    Sig:   . Insulin Detemir (LEVEMIR FLEXTOUCH) 100 UNIT/ML Pen    Sig: Inject 60 Units into the skin daily at 10 pm. DX 250.72    Dispense:  15 mL    Refill:  11  . Insulin Lispro, Human, (HUMALOG KWIKPEN) 200 UNIT/ML SOPN    Sig: Inject 5 Units into the skin 3 (three) times daily with meals as needed.    Dispense:  3 mL    Refill:  11   See AVS for instructions about healthy living and anticipatory guidance.  Follow-up: Return in about 4 months (around 05/29/2015).  Scarlette Calico, MD

## 2015-01-27 NOTE — Patient Instructions (Signed)
Preventive Care for Adults A healthy lifestyle and preventive care can promote health and wellness. Preventive health guidelines for women include the following key practices.  A routine yearly physical is a good way to check with your health care provider about your health and preventive screening. It is a chance to share any concerns and updates on your health and to receive a thorough exam.  Visit your dentist for a routine exam and preventive care every 6 months. Brush your teeth twice a day and floss once a day. Good oral hygiene prevents tooth decay and gum disease.  The frequency of eye exams is based on your age, health, family medical history, use of contact lenses, and other factors. Follow your health care provider's recommendations for frequency of eye exams.  Eat a healthy diet. Foods like vegetables, fruits, whole grains, low-fat dairy products, and lean protein foods contain the nutrients you need without too many calories. Decrease your intake of foods high in solid fats, added sugars, and salt. Eat the right amount of calories for you.Get information about a proper diet from your health care provider, if necessary.  Regular physical exercise is one of the most important things you can do for your health. Most adults should get at least 150 minutes of moderate-intensity exercise (any activity that increases your heart rate and causes you to sweat) each week. In addition, most adults need muscle-strengthening exercises on 2 or more days a week.  Maintain a healthy weight. The body mass index (BMI) is a screening tool to identify possible weight problems. It provides an estimate of body fat based on height and weight. Your health care provider can find your BMI and can help you achieve or maintain a healthy weight.For adults 20 years and older:  A BMI below 18.5 is considered underweight.  A BMI of 18.5 to 24.9 is normal.  A BMI of 25 to 29.9 is considered overweight.  A BMI of  30 and above is considered obese.  Maintain normal blood lipids and cholesterol levels by exercising and minimizing your intake of saturated fat. Eat a balanced diet with plenty of fruit and vegetables. Blood tests for lipids and cholesterol should begin at age 76 and be repeated every 5 years. If your lipid or cholesterol levels are high, you are over 50, or you are at high risk for heart disease, you may need your cholesterol levels checked more frequently.Ongoing high lipid and cholesterol levels should be treated with medicines if diet and exercise are not working.  If you smoke, find out from your health care provider how to quit. If you do not use tobacco, do not start.  Lung cancer screening is recommended for adults aged 22-80 years who are at high risk for developing lung cancer because of a history of smoking. A yearly low-dose CT scan of the lungs is recommended for people who have at least a 30-pack-year history of smoking and are a current smoker or have quit within the past 15 years. A pack year of smoking is smoking an average of 1 pack of cigarettes a day for 1 year (for example: 1 pack a day for 30 years or 2 packs a day for 15 years). Yearly screening should continue until the smoker has stopped smoking for at least 15 years. Yearly screening should be stopped for people who develop a health problem that would prevent them from having lung cancer treatment.  If you are pregnant, do not drink alcohol. If you are breastfeeding,  be very cautious about drinking alcohol. If you are not pregnant and choose to drink alcohol, do not have more than 1 drink per day. One drink is considered to be 12 ounces (355 mL) of beer, 5 ounces (148 mL) of wine, or 1.5 ounces (44 mL) of liquor.  Avoid use of street drugs. Do not share needles with anyone. Ask for help if you need support or instructions about stopping the use of drugs.  High blood pressure causes heart disease and increases the risk of  stroke. Your blood pressure should be checked at least every 1 to 2 years. Ongoing high blood pressure should be treated with medicines if weight loss and exercise do not work.  If you are 75-52 years old, ask your health care provider if you should take aspirin to prevent strokes.  Diabetes screening involves taking a blood sample to check your fasting blood sugar level. This should be done once every 3 years, after age 15, if you are within normal weight and without risk factors for diabetes. Testing should be considered at a younger age or be carried out more frequently if you are overweight and have at least 1 risk factor for diabetes.  Breast cancer screening is essential preventive care for women. You should practice "breast self-awareness." This means understanding the normal appearance and feel of your breasts and may include breast self-examination. Any changes detected, no matter how small, should be reported to a health care provider. Women in their 58s and 30s should have a clinical breast exam (CBE) by a health care provider as part of a regular health exam every 1 to 3 years. After age 16, women should have a CBE every year. Starting at age 53, women should consider having a mammogram (breast X-ray test) every year. Women who have a family history of breast cancer should talk to their health care provider about genetic screening. Women at a high risk of breast cancer should talk to their health care providers about having an MRI and a mammogram every year.  Breast cancer gene (BRCA)-related cancer risk assessment is recommended for women who have family members with BRCA-related cancers. BRCA-related cancers include breast, ovarian, tubal, and peritoneal cancers. Having family members with these cancers may be associated with an increased risk for harmful changes (mutations) in the breast cancer genes BRCA1 and BRCA2. Results of the assessment will determine the need for genetic counseling and  BRCA1 and BRCA2 testing.  Routine pelvic exams to screen for cancer are no longer recommended for nonpregnant women who are considered low risk for cancer of the pelvic organs (ovaries, uterus, and vagina) and who do not have symptoms. Ask your health care provider if a screening pelvic exam is right for you.  If you have had past treatment for cervical cancer or a condition that could lead to cancer, you need Pap tests and screening for cancer for at least 20 years after your treatment. If Pap tests have been discontinued, your risk factors (such as having a new sexual partner) need to be reassessed to determine if screening should be resumed. Some women have medical problems that increase the chance of getting cervical cancer. In these cases, your health care provider may recommend more frequent screening and Pap tests.  The HPV test is an additional test that may be used for cervical cancer screening. The HPV test looks for the virus that can cause the cell changes on the cervix. The cells collected during the Pap test can be  tested for HPV. The HPV test could be used to screen women aged 30 years and older, and should be used in women of any age who have unclear Pap test results. After the age of 30, women should have HPV testing at the same frequency as a Pap test.  Colorectal cancer can be detected and often prevented. Most routine colorectal cancer screening begins at the age of 50 years and continues through age 75 years. However, your health care provider may recommend screening at an earlier age if you have risk factors for colon cancer. On a yearly basis, your health care provider may provide home test kits to check for hidden blood in the stool. Use of a small camera at the end of a tube, to directly examine the colon (sigmoidoscopy or colonoscopy), can detect the earliest forms of colorectal cancer. Talk to your health care provider about this at age 50, when routine screening begins. Direct  exam of the colon should be repeated every 5-10 years through age 75 years, unless early forms of pre-cancerous polyps or small growths are found.  People who are at an increased risk for hepatitis B should be screened for this virus. You are considered at high risk for hepatitis B if:  You were born in a country where hepatitis B occurs often. Talk with your health care provider about which countries are considered high risk.  Your parents were born in a high-risk country and you have not received a shot to protect against hepatitis B (hepatitis B vaccine).  You have HIV or AIDS.  You use needles to inject street drugs.  You live with, or have sex with, someone who has hepatitis B.  You get hemodialysis treatment.  You take certain medicines for conditions like cancer, organ transplantation, and autoimmune conditions.  Hepatitis C blood testing is recommended for all people born from 1945 through 1965 and any individual with known risks for hepatitis C.  Practice safe sex. Use condoms and avoid high-risk sexual practices to reduce the spread of sexually transmitted infections (STIs). STIs include gonorrhea, chlamydia, syphilis, trichomonas, herpes, HPV, and human immunodeficiency virus (HIV). Herpes, HIV, and HPV are viral illnesses that have no cure. They can result in disability, cancer, and death.  You should be screened for sexually transmitted illnesses (STIs) including gonorrhea and chlamydia if:  You are sexually active and are younger than 24 years.  You are older than 24 years and your health care provider tells you that you are at risk for this type of infection.  Your sexual activity has changed since you were last screened and you are at an increased risk for chlamydia or gonorrhea. Ask your health care provider if you are at risk.  If you are at risk of being infected with HIV, it is recommended that you take a prescription medicine daily to prevent HIV infection. This is  called preexposure prophylaxis (PrEP). You are considered at risk if:  You are a heterosexual woman, are sexually active, and are at increased risk for HIV infection.  You take drugs by injection.  You are sexually active with a partner who has HIV.  Talk with your health care provider about whether you are at high risk of being infected with HIV. If you choose to begin PrEP, you should first be tested for HIV. You should then be tested every 3 months for as long as you are taking PrEP.  Osteoporosis is a disease in which the bones lose minerals and strength   with aging. This can result in serious bone fractures or breaks. The risk of osteoporosis can be identified using a bone density scan. Women ages 65 years and over and women at risk for fractures or osteoporosis should discuss screening with their health care providers. Ask your health care provider whether you should take a calcium supplement or vitamin D to reduce the rate of osteoporosis.  Menopause can be associated with physical symptoms and risks. Hormone replacement therapy is available to decrease symptoms and risks. You should talk to your health care provider about whether hormone replacement therapy is right for you.  Use sunscreen. Apply sunscreen liberally and repeatedly throughout the day. You should seek shade when your shadow is shorter than you. Protect yourself by wearing long sleeves, pants, a wide-brimmed hat, and sunglasses year round, whenever you are outdoors.  Once a month, do a whole body skin exam, using a mirror to look at the skin on your back. Tell your health care provider of new moles, moles that have irregular borders, moles that are larger than a pencil eraser, or moles that have changed in shape or color.  Stay current with required vaccines (immunizations).  Influenza vaccine. All adults should be immunized every year.  Tetanus, diphtheria, and acellular pertussis (Td, Tdap) vaccine. Pregnant women should  receive 1 dose of Tdap vaccine during each pregnancy. The dose should be obtained regardless of the length of time since the last dose. Immunization is preferred during the 27th-36th week of gestation. An adult who has not previously received Tdap or who does not know her vaccine status should receive 1 dose of Tdap. This initial dose should be followed by tetanus and diphtheria toxoids (Td) booster doses every 10 years. Adults with an unknown or incomplete history of completing a 3-dose immunization series with Td-containing vaccines should begin or complete a primary immunization series including a Tdap dose. Adults should receive a Td booster every 10 years.  Varicella vaccine. An adult without evidence of immunity to varicella should receive 2 doses or a second dose if she has previously received 1 dose. Pregnant females who do not have evidence of immunity should receive the first dose after pregnancy. This first dose should be obtained before leaving the health care facility. The second dose should be obtained 4-8 weeks after the first dose.  Human papillomavirus (HPV) vaccine. Females aged 13-26 years who have not received the vaccine previously should obtain the 3-dose series. The vaccine is not recommended for use in pregnant females. However, pregnancy testing is not needed before receiving a dose. If a female is found to be pregnant after receiving a dose, no treatment is needed. In that case, the remaining doses should be delayed until after the pregnancy. Immunization is recommended for any person with an immunocompromised condition through the age of 26 years if she did not get any or all doses earlier. During the 3-dose series, the second dose should be obtained 4-8 weeks after the first dose. The third dose should be obtained 24 weeks after the first dose and 16 weeks after the second dose.  Zoster vaccine. One dose is recommended for adults aged 60 years or older unless certain conditions are  present.  Measles, mumps, and rubella (MMR) vaccine. Adults born before 1957 generally are considered immune to measles and mumps. Adults born in 1957 or later should have 1 or more doses of MMR vaccine unless there is a contraindication to the vaccine or there is laboratory evidence of immunity to   each of the three diseases. A routine second dose of MMR vaccine should be obtained at least 28 days after the first dose for students attending postsecondary schools, health care workers, or international travelers. People who received inactivated measles vaccine or an unknown type of measles vaccine during 1963-1967 should receive 2 doses of MMR vaccine. People who received inactivated mumps vaccine or an unknown type of mumps vaccine before 1979 and are at high risk for mumps infection should consider immunization with 2 doses of MMR vaccine. For females of childbearing age, rubella immunity should be determined. If there is no evidence of immunity, females who are not pregnant should be vaccinated. If there is no evidence of immunity, females who are pregnant should delay immunization until after pregnancy. Unvaccinated health care workers born before 1957 who lack laboratory evidence of measles, mumps, or rubella immunity or laboratory confirmation of disease should consider measles and mumps immunization with 2 doses of MMR vaccine or rubella immunization with 1 dose of MMR vaccine.  Pneumococcal 13-valent conjugate (PCV13) vaccine. When indicated, a person who is uncertain of her immunization history and has no record of immunization should receive the PCV13 vaccine. An adult aged 19 years or older who has certain medical conditions and has not been previously immunized should receive 1 dose of PCV13 vaccine. This PCV13 should be followed with a dose of pneumococcal polysaccharide (PPSV23) vaccine. The PPSV23 vaccine dose should be obtained at least 8 weeks after the dose of PCV13 vaccine. An adult aged 19  years or older who has certain medical conditions and previously received 1 or more doses of PPSV23 vaccine should receive 1 dose of PCV13. The PCV13 vaccine dose should be obtained 1 or more years after the last PPSV23 vaccine dose.  Pneumococcal polysaccharide (PPSV23) vaccine. When PCV13 is also indicated, PCV13 should be obtained first. All adults aged 65 years and older should be immunized. An adult younger than age 65 years who has certain medical conditions should be immunized. Any person who resides in a nursing home or long-term care facility should be immunized. An adult smoker should be immunized. People with an immunocompromised condition and certain other conditions should receive both PCV13 and PPSV23 vaccines. People with human immunodeficiency virus (HIV) infection should be immunized as soon as possible after diagnosis. Immunization during chemotherapy or radiation therapy should be avoided. Routine use of PPSV23 vaccine is not recommended for American Indians, [place] Natives, or people younger than 65 years unless there are medical conditions that require PPSV23 vaccine. When indicated, people who have unknown immunization and have no record of immunization should receive PPSV23 vaccine. One-time revaccination 5 years after the first dose of PPSV23 is recommended for people aged 19-64 years who have chronic kidney failure, nephrotic syndrome, asplenia, or immunocompromised conditions. People who received 1-2 doses of PPSV23 before age 65 years should receive another dose of PPSV23 vaccine at age 65 years or later if at least 5 years have passed since the previous dose. Doses of PPSV23 are not needed for people immunized with PPSV23 at or after age 65 years.  Meningococcal vaccine. Adults with asplenia or persistent complement component deficiencies should receive 2 doses of quadrivalent meningococcal conjugate (MenACWY-D) vaccine. The doses should be obtained at least 2 months apart.  Microbiologists working with certain meningococcal bacteria, military recruits, people at risk during an outbreak, and people who travel to or live in countries with a high rate of meningitis should be immunized. A first-year college student up through age   21 years who is living in a residence hall should receive a dose if she did not receive a dose on or after her 16th birthday. Adults who have certain high-risk conditions should receive one or more doses of vaccine.  Hepatitis A vaccine. Adults who wish to be protected from this disease, have certain high-risk conditions, work with hepatitis A-infected animals, work in hepatitis A research labs, or travel to or work in countries with a high rate of hepatitis A should be immunized. Adults who were previously unvaccinated and who anticipate close contact with an international adoptee during the first 60 days after arrival in the [place] from a country with a high rate of hepatitis A should be immunized.  Hepatitis B vaccine. Adults who wish to be protected from this disease, have certain high-risk conditions, may be exposed to blood or other infectious body fluids, are household contacts or sex partners of hepatitis B positive people, are clients or workers in certain care facilities, or travel to or work in countries with a high rate of hepatitis B should be immunized.  Haemophilus influenzae type b (Hib) vaccine. A previously unvaccinated person with asplenia or sickle cell disease or having a scheduled splenectomy should receive 1 dose of Hib vaccine. Regardless of previous immunization, a recipient of a hematopoietic stem cell transplant should receive a 3-dose series 6-12 months after her successful transplant. Hib vaccine is not recommended for adults with HIV infection. Preventive Services / Frequency Ages 64 to 68 years  Blood pressure check.** / Every 1 to 2 years.  Lipid and cholesterol check.** / Every 5 years beginning at age  22.  Clinical breast exam.** / Every 3 years for women in their 88s and 53s.  BRCA-related cancer risk assessment.** / For women who have family members with a BRCA-related cancer (breast, ovarian, tubal, or peritoneal cancers).  Pap test.** / Every 2 years from ages 90 through 51. Every 3 years starting at age 21 through age 56 or 3 with a history of 3 consecutive normal Pap tests.  HPV screening.** / Every 3 years from ages 24 through ages 1 to 46 with a history of 3 consecutive normal Pap tests.  Hepatitis C blood test.** / For any individual with known risks for hepatitis C.  Skin self-exam. / Monthly.  Influenza vaccine. / Every year.  Tetanus, diphtheria, and acellular pertussis (Tdap, Td) vaccine.** / Consult your health care provider. Pregnant women should receive 1 dose of Tdap vaccine during each pregnancy. 1 dose of Td every 10 years.  Varicella vaccine.** / Consult your health care provider. Pregnant females who do not have evidence of immunity should receive the first dose after pregnancy.  HPV vaccine. / 3 doses over 6 months, if 72 and younger. The vaccine is not recommended for use in pregnant females. However, pregnancy testing is not needed before receiving a dose.  Measles, mumps, rubella (MMR) vaccine.** / You need at least 1 dose of MMR if you were born in 1957 or later. You may also need a 2nd dose. For females of childbearing age, rubella immunity should be determined. If there is no evidence of immunity, females who are not pregnant should be vaccinated. If there is no evidence of immunity, females who are pregnant should delay immunization until after pregnancy.  Pneumococcal 13-valent conjugate (PCV13) vaccine.** / Consult your health care provider.  Pneumococcal polysaccharide (PPSV23) vaccine.** / 1 to 2 doses if you smoke cigarettes or if you have certain conditions.  Meningococcal vaccine.** /  1 dose if you are age 19 to 21 years and a first-year college  student living in a residence hall, or have one of several medical conditions, you need to get vaccinated against meningococcal disease. You may also need additional booster doses.  Hepatitis A vaccine.** / Consult your health care provider.  Hepatitis B vaccine.** / Consult your health care provider.  Haemophilus influenzae type b (Hib) vaccine.** / Consult your health care provider. Ages 40 to 64 years  Blood pressure check.** / Every 1 to 2 years.  Lipid and cholesterol check.** / Every 5 years beginning at age 20 years.  Lung cancer screening. / Every year if you are aged 55-80 years and have a 30-pack-year history of smoking and currently smoke or have quit within the past 15 years. Yearly screening is stopped once you have quit smoking for at least 15 years or develop a health problem that would prevent you from having lung cancer treatment.  Clinical breast exam.** / Every year after age 40 years.  BRCA-related cancer risk assessment.** / For women who have family members with a BRCA-related cancer (breast, ovarian, tubal, or peritoneal cancers).  Mammogram.** / Every year beginning at age 40 years and continuing for as long as you are in good health. Consult with your health care provider.  Pap test.** / Every 3 years starting at age 30 years through age 65 or 70 years with a history of 3 consecutive normal Pap tests.  HPV screening.** / Every 3 years from ages 30 years through ages 65 to 70 years with a history of 3 consecutive normal Pap tests.  Fecal occult blood test (FOBT) of stool. / Every year beginning at age 50 years and continuing until age 75 years. You may not need to do this test if you get a colonoscopy every 10 years.  Flexible sigmoidoscopy or colonoscopy.** / Every 5 years for a flexible sigmoidoscopy or every 10 years for a colonoscopy beginning at age 50 years and continuing until age 75 years.  Hepatitis C blood test.** / For all people born from 1945 through  1965 and any individual with known risks for hepatitis C.  Skin self-exam. / Monthly.  Influenza vaccine. / Every year.  Tetanus, diphtheria, and acellular pertussis (Tdap/Td) vaccine.** / Consult your health care provider. Pregnant women should receive 1 dose of Tdap vaccine during each pregnancy. 1 dose of Td every 10 years.  Varicella vaccine.** / Consult your health care provider. Pregnant females who do not have evidence of immunity should receive the first dose after pregnancy.  Zoster vaccine.** / 1 dose for adults aged 60 years or older.  Measles, mumps, rubella (MMR) vaccine.** / You need at least 1 dose of MMR if you were born in 1957 or later. You may also need a 2nd dose. For females of childbearing age, rubella immunity should be determined. If there is no evidence of immunity, females who are not pregnant should be vaccinated. If there is no evidence of immunity, females who are pregnant should delay immunization until after pregnancy.  Pneumococcal 13-valent conjugate (PCV13) vaccine.** / Consult your health care provider.  Pneumococcal polysaccharide (PPSV23) vaccine.** / 1 to 2 doses if you smoke cigarettes or if you have certain conditions.  Meningococcal vaccine.** / Consult your health care provider.  Hepatitis A vaccine.** / Consult your health care provider.  Hepatitis B vaccine.** / Consult your health care provider.  Haemophilus influenzae type b (Hib) vaccine.** / Consult your health care provider. Ages 65   years and over  Blood pressure check.** / Every 1 to 2 years.  Lipid and cholesterol check.** / Every 5 years beginning at age 22 years.  Lung cancer screening. / Every year if you are aged 73-80 years and have a 30-pack-year history of smoking and currently smoke or have quit within the past 15 years. Yearly screening is stopped once you have quit smoking for at least 15 years or develop a health problem that would prevent you from having lung cancer  treatment.  Clinical breast exam.** / Every year after age 4 years.  BRCA-related cancer risk assessment.** / For women who have family members with a BRCA-related cancer (breast, ovarian, tubal, or peritoneal cancers).  Mammogram.** / Every year beginning at age 40 years and continuing for as long as you are in good health. Consult with your health care provider.  Pap test.** / Every 3 years starting at age 9 years through age 34 or 91 years with 3 consecutive normal Pap tests. Testing can be stopped between 65 and 70 years with 3 consecutive normal Pap tests and no abnormal Pap or HPV tests in the past 10 years.  HPV screening.** / Every 3 years from ages 57 years through ages 64 or 45 years with a history of 3 consecutive normal Pap tests. Testing can be stopped between 65 and 70 years with 3 consecutive normal Pap tests and no abnormal Pap or HPV tests in the past 10 years.  Fecal occult blood test (FOBT) of stool. / Every year beginning at age 15 years and continuing until age 17 years. You may not need to do this test if you get a colonoscopy every 10 years.  Flexible sigmoidoscopy or colonoscopy.** / Every 5 years for a flexible sigmoidoscopy or every 10 years for a colonoscopy beginning at age 86 years and continuing until age 71 years.  Hepatitis C blood test.** / For all people born from [birth year] through 1964 and any individual with known risks for hepatitis C.  Osteoporosis screening.** / A one-time screening for women ages 83 years and over and women at risk for fractures or osteoporosis.  Skin self-exam. / Monthly.  Influenza vaccine. / Every year.  Tetanus, diphtheria, and acellular pertussis (Tdap/Td) vaccine.** / 1 dose of Td every 10 years.  Varicella vaccine.** / Consult your health care provider.  Zoster vaccine.** / 1 dose for adults aged 61 years or older.  Pneumococcal 13-valent conjugate (PCV13) vaccine.** / Consult your health care provider.  Pneumococcal  polysaccharide (PPSV23) vaccine.** / 1 dose for all adults aged 28 years and older.  Meningococcal vaccine.** / Consult your health care provider.  Hepatitis A vaccine.** / Consult your health care provider.  Hepatitis B vaccine.** / Consult your health care provider.  Haemophilus influenzae type b (Hib) vaccine.** / Consult your health care provider. ** Family history and personal history of risk and conditions may change your health care provider's recommendations. Document Released: 06/13/2001 Document Revised: 09/01/2013 Document Reviewed: 09/12/2010 [facility] Patient Information 2015 [place], [place]. This information is not intended to replace advice given to you by your health care provider. Make sure you discuss any questions you have with your health care provider.

## 2015-01-28 ENCOUNTER — Other Ambulatory Visit: Payer: Self-pay | Admitting: Internal Medicine

## 2015-01-28 DIAGNOSIS — E1151 Type 2 diabetes mellitus with diabetic peripheral angiopathy without gangrene: Secondary | ICD-10-CM

## 2015-01-28 DIAGNOSIS — Z0000 Encounter for general adult medical examination without abnormal findings: Secondary | ICD-10-CM | POA: Insufficient documentation

## 2015-01-28 LAB — CULTURE, URINE COMPREHENSIVE

## 2015-01-28 MED ORDER — INSULIN DETEMIR 100 UNIT/ML FLEXPEN: 60.0000 [IU] | PEN_INJECTOR | Freq: Every day | SUBCUTANEOUS | Status: DC

## 2015-01-28 MED ORDER — INSULIN LISPRO 200 UNIT/ML ~~LOC~~ SOPN: 5.0000 [IU] | PEN_INJECTOR | Freq: Three times a day (TID) | SUBCUTANEOUS | Status: DC | PRN

## 2015-01-28 NOTE — Assessment & Plan Note (Signed)

## 2015-02-02 ENCOUNTER — Encounter: Payer: Self-pay | Admitting: Internal Medicine

## 2015-02-02 ENCOUNTER — Ambulatory Visit (INDEPENDENT_AMBULATORY_CARE_PROVIDER_SITE_OTHER): Payer: 59 | Admitting: Internal Medicine

## 2015-02-02 VITALS — BP 108/60 | HR 62 | Temp 97.4°F | Ht 65.0 in | Wt 175.2 lb

## 2015-02-02 DIAGNOSIS — R51 Headache: Secondary | ICD-10-CM

## 2015-02-02 DIAGNOSIS — R519 Headache, unspecified: Secondary | ICD-10-CM | POA: Insufficient documentation

## 2015-02-02 DIAGNOSIS — G44039 Episodic paroxysmal hemicrania, not intractable: Secondary | ICD-10-CM | POA: Diagnosis not present

## 2015-02-02 MED ORDER — IBUPROFEN 600 MG PO TABS: 600.0000 mg | ORAL_TABLET | Freq: Three times a day (TID) | ORAL | Status: DC | PRN

## 2015-02-02 NOTE — Progress Notes (Signed)
   Subjective:    Patient ID: Theresa Harrington, female    DOB: 1955-06-03, 60 y.o.   MRN: [id number]  HPI Headache:  She presents today with a new headache that started about 5 days ago.  The headache is intermittent and on the left side of her head only.  The pain is a stabbing type pain and only lasts less than one second.  There is tenderness on the scalp when she palpates it.  She has the pain several times a day, up to 20.  There is no pattern regarding when the pain comes.  She thinks the pain has been getting better - it occurs less often.  She has only felt the pain twice today.  She denies a history of headaches/migraines and has not had similar pain in the past.  She took ibuprofen 800 mg x 2 Saturday night and did not feel the pain until Sunday afternoon.  She has not tried any other medication/treatments.    She denies any other concerning symptoms such as neck pain, numbness/tingling, weakness, confusion and changes in her vision.    She has been under increased stress, but does not feel it would cause this since the pain is only on one side of her head.  Her insulin was recently increased and she feels she is tolerating the increase well.      Review of Systems  Constitutional: Negative for fever, chills and appetite change.  HENT: Negative for hearing loss and trouble swallowing.   Eyes: Positive for visual disturbance. Negative for photophobia.  Respiratory: Negative for shortness of breath.   Cardiovascular: Negative for chest pain and palpitations.  Neurological: Negative for dizziness, speech difficulty, light-headedness and numbness.  Psychiatric/Behavioral: Negative for behavioral problems and confusion.       Objective:   Physical Exam  Constitutional: She is oriented to person, place, and time. She appears well-developed and well-nourished.  HENT:  Head: Normocephalic and atraumatic.  No tenderness with palpation of left side of head  Neck: Normal range of motion.    Cardiovascular: Normal rate and regular rhythm.   No murmur heard. Pulmonary/Chest: Effort normal and breath sounds normal. No respiratory distress. She has no wheezes.  Musculoskeletal: She exhibits no edema.  No neck or upper back tenderness  Neurological: She is alert and oriented to person, place, and time. No cranial nerve deficit. She exhibits normal muscle tone.  Normal strength and sensation in all extremities  Skin: Skin is warm and dry.  Psychiatric: She has a normal mood and affect. Her behavior is normal. Thought content normal.     Filed Vitals:   02/02/15 1112  BP: 108/60  Pulse: 62  Temp: 97.4 F (36.3 C)        Assessment & Plan:   See problem list for assessment and plan

## 2015-02-02 NOTE — Assessment & Plan Note (Signed)
New headache, possible neuralgia in nature given the intermittent nature The pain seems to be getting better and resolved with ibuprofen No concerning symptoms or signs on exam so imaging is not needed at this time She will try taking ibuprofen 600 mg TID as needed for a few days - prescription sent to pharmacy She will monitor her symptoms and call if she develops any new/concerning symptoms or if her symptoms do not go away

## 2015-02-02 NOTE — Progress Notes (Signed)
Pre visit review using our clinic review tool, if applicable. No additional management support is needed unless otherwise documented below in the visit note. 

## 2015-02-02 NOTE — Patient Instructions (Addendum)
You have a non-specific headache.  Take the ibuprofen as needed with food to avoid stomach upset  The prescription has been sent to your pharmacy.  If you experience any new symptoms or your headache does not go away over the next week please call.         General Headache Without Cause A headache is pain or discomfort felt around the head or neck area. The specific cause of a headache may not be found. There are many causes and types of headaches. A few common ones are:  Tension headaches.  Migraine headaches.  Cluster headaches.  Chronic daily headaches. HOME CARE INSTRUCTIONS   Keep all follow-up appointments with your caregiver or any specialist referral.  Only take over-the-counter or prescription medicines for pain or discomfort as directed by your caregiver.  Lie down in a dark, quiet room when you have a headache.  Keep a headache journal to find out what may trigger your migraine headaches. For example, write down:  What you eat and drink.  How much sleep you get.  Any change to your diet or medicines.  Try massage or other relaxation techniques.  Put ice packs or heat on the head and neck. Use these 3 to 4 times per day for 15 to 20 minutes each time, or as needed.  Limit stress.  Sit up straight, and do not tense your muscles.  Quit smoking if you smoke.  Limit alcohol use.  Decrease the amount of caffeine you drink, or stop drinking caffeine.  Eat and sleep on a regular schedule.  Get 7 to 9 hours of sleep, or as recommended by your caregiver.  Keep lights dim if bright lights bother you and make your headaches worse. SEEK MEDICAL CARE IF:   You have problems with the medicines you were prescribed.  Your medicines are not working.  You have a change from the usual headache.  You have nausea or vomiting. SEEK IMMEDIATE MEDICAL CARE IF:   Your headache becomes severe.  You have a fever.  You have a stiff neck.  You have loss of  vision.  You have muscular weakness or loss of muscle control.  You start losing your balance or have trouble walking.  You feel faint or pass out.  You have severe symptoms that are different from your first symptoms. MAKE SURE YOU:   Understand these instructions.  Will watch your condition.  Will get help right away if you are not doing well or get worse. Document Released: 04/17/2005 Document Revised: 07/10/2011 Document Reviewed: 05/03/2011 [facility] Patient Information 2015 [place], [place]. This information is not intended to replace advice given to you by your health care provider. Make sure you discuss any questions you have with your health care provider.

## 2015-02-03 ENCOUNTER — Ambulatory Visit: Payer: 59 | Admitting: Endocrinology

## 2015-02-04 ENCOUNTER — Encounter (HOSPITAL_COMMUNITY): Payer: 59

## 2015-02-08 ENCOUNTER — Ambulatory Visit: Payer: 59 | Admitting: Surgery

## 2015-02-09 ENCOUNTER — Ambulatory Visit (INDEPENDENT_AMBULATORY_CARE_PROVIDER_SITE_OTHER): Payer: 59 | Admitting: Internal Medicine

## 2015-02-09 ENCOUNTER — Encounter: Payer: Self-pay | Admitting: Internal Medicine

## 2015-02-09 VITALS — BP 100/78 | HR 60 | Temp 97.8°F | Resp 16 | Ht 65.0 in | Wt 178.0 lb

## 2015-02-09 DIAGNOSIS — R519 Headache, unspecified: Secondary | ICD-10-CM

## 2015-02-09 DIAGNOSIS — R51 Headache: Secondary | ICD-10-CM | POA: Diagnosis not present

## 2015-02-09 LAB — COLOGUARD

## 2015-02-09 MED ORDER — NORTRIPTYLINE HCL 10 MG PO CAPS: 10.0000 mg | ORAL_CAPSULE | Freq: Every day | ORAL | Status: DC

## 2015-02-09 NOTE — Patient Instructions (Signed)
Tension Headache A tension headache is a feeling of pain, pressure, or aching that is often felt over the front and sides of the head. The pain can be dull, or it can feel tight (constricting). Tension headaches are not normally associated with nausea or vomiting, and they do not get worse with physical activity. Tension headaches can last from 30 minutes to several days. This is the most common type of headache. CAUSES The exact cause of this condition is not known. Tension headaches often begin after stress, anxiety, or depression. Other triggers may include:  Alcohol.  Too much caffeine, or caffeine withdrawal.  Respiratory infections, such as colds, flu, or sinus infections.  Dental problems or teeth clenching.  Fatigue.  Holding your head and neck in the same position for a long period of time, such as while using a computer.  Smoking. SYMPTOMS Symptoms of this condition include:  A feeling of pressure around the head.  Dull, aching head pain.  Pain felt over the front and sides of the head.  Tenderness in the muscles of the head, neck, and shoulders. DIAGNOSIS This condition may be diagnosed based on your symptoms and a physical exam. Tests may be done, such as a CT scan or an MRI of your head. These tests may be done if your symptoms are severe or unusual. TREATMENT This condition may be treated with lifestyle changes and medicines to help relieve symptoms. HOME CARE INSTRUCTIONS Managing Pain  Take over-the-counter and prescription medicines only as told by your health care provider.  Lie down in a dark, quiet room when you have a headache.  If directed, apply ice to the head and neck area:  Put ice in a plastic bag.  Place a towel between your skin and the bag.  Leave the ice on for 20 minutes, 2-3 times per day.  Use a heating pad or a hot shower to apply heat to the head and neck area as told by your health care provider. Eating and Drinking  Eat meals on  a regular schedule.  Limit alcohol use.  Decrease your caffeine intake, or stop using caffeine. General Instructions  Keep all follow-up visits as told by your health care provider. This is important.  Keep a headache journal to help find out what may trigger your headaches. For example, write down:  What you eat and drink.  How much sleep you get.  Any change to your diet or medicines.  Try massage or other relaxation techniques.  Limit stress.  Sit up straight, and avoid tensing your muscles.  Do not use tobacco products, including cigarettes, chewing tobacco, or e-cigarettes. If you need help quitting, ask your health care provider.  Exercise regularly as told by your health care provider.  Get 7-9 hours of sleep, or the amount recommended by your health care provider. SEEK MEDICAL CARE IF:  Your symptoms are not helped by medicine.  You have a headache that is different from what you normally experience.  You have nausea or you vomit.  You have a fever. SEEK IMMEDIATE MEDICAL CARE IF:  Your headache becomes severe.  You have repeated vomiting.  You have a stiff neck.  You have a loss of vision.  You have problems with speech.  You have pain in your eye or ear.  You have muscular weakness or loss of muscle control.  You lose your balance or you have trouble walking.  You feel faint or you pass out.  You have confusion.     This information is not intended to replace advice given to you by your health care provider. Make sure you discuss any questions you have with your health care provider.   Document Released: 04/17/2005 Document Revised: 01/06/2015 Document Reviewed: 08/10/2014 Elsevier Interactive Patient Education 2016 [employer]  

## 2015-02-09 NOTE — Progress Notes (Signed)
Subjective:  Patient ID: Theresa Harrington, female    DOB: 18-Jun-1955  Age: 59 y.o. MRN: [id number]  CC: Headache   HPI Theresa Harrington presents for follow-up on headache. She has had a left sided headache for about 2-3 weeks. She says the scalp hurts and at times she has a stabbing and a throbbing sensation in the left posterior scalp area. She was seen by another provider about a week ago and was placed on Motrin. She states that that helps some but hasn't totally controlled the headaches. She said she has had a headache like this before and never one that is lasted this long.  Outpatient Prescriptions Prior to Visit  Medication Sig Dispense Refill  . aspirin EC 325 MG tablet Take 325 mg by mouth daily.    . Canagliflozin-Metformin HCl [phone] MG TABS Take 1 tablet by mouth 2 (two) times daily. 180 tablet 1  . clopidogrel (PLAVIX) 75 MG tablet TAKE ONE TABLET BY MOUTH ONCE DAILY 30 tablet 6  . ezetimibe (ZETIA) 10 MG tablet Take 1 tablet (10 mg total) by mouth daily. 90 tablet 3  . gabapentin (NEURONTIN) 300 MG capsule Take 1 capsule (300 mg total) by mouth 3 (three) times daily. 90 capsule 11  . glyBURIDE (DIABETA) 5 MG tablet Take 1 tablet (5 mg total) by mouth daily with breakfast. 90 tablet 1  . ibuprofen (ADVIL,MOTRIN) 600 MG tablet Take 1 tablet (600 mg total) by mouth every 8 (eight) hours as needed. 30 tablet 0  . Insulin Detemir (LEVEMIR FLEXTOUCH) 100 UNIT/ML Pen Inject 60 Units into the skin daily at 10 pm. DX 250.72 15 mL 11  . Insulin Lispro, Human, (HUMALOG KWIKPEN) 200 UNIT/ML SOPN Inject 5 Units into the skin 3 (three) times daily with meals as needed. 3 mL 11  . Insulin Pen Needle (NOVOFINE PLUS) 32G X 4 MM MISC Use to inject once a day Dx 250.00 100 each 3  . LIVALO 2 MG TABS TAKE ONE TABLET BY MOUTH ONCE DAILY 90 tablet 3  . losartan-hydrochlorothiazide (HYZAAR) 100-12.5 MG per tablet TAKE ONE TABLET BY MOUTH ONCE DAILY 90 tablet 2  . Magnesium Oxide 400 MG CAPS Take 1 capsule  (400 mg total) by mouth daily. 90 capsule 3  . pantoprazole (PROTONIX) 40 MG tablet TAKE ONE TABLET BY MOUTH ONCE DAILY 90 tablet 3  . ALPRAZolam (XANAX) 0.5 MG tablet     . Ferrous Sulfate (IRON) 325 (65 FE) MG TABS Take 325 mg by mouth daily.      No facility-administered medications prior to visit.    ROS Review of Systems  Constitutional: Negative.  Negative for fever, chills, diaphoresis, appetite change and fatigue.  HENT: Negative for trouble swallowing.   Eyes: Negative.  Negative for photophobia, redness and visual disturbance.  Respiratory: Negative.   Cardiovascular: Negative.  Negative for chest pain, palpitations and leg swelling.  Gastrointestinal: Negative.  Negative for nausea, vomiting, abdominal pain, diarrhea and constipation.  Endocrine: Negative.   Genitourinary: Negative.   Musculoskeletal: Negative.  Negative for myalgias, back pain, joint swelling, arthralgias, gait problem, neck pain and neck stiffness.  Skin: Negative.   Allergic/Immunologic: Negative.   Neurological: Positive for headaches. Negative for dizziness, tremors, seizures, syncope, facial asymmetry, speech difficulty, weakness, light-headedness and numbness.  Hematological: Negative.  Negative for adenopathy. Does not bruise/bleed easily.  Psychiatric/Behavioral: Negative.  Negative for sleep disturbance and agitation. The patient is not nervous/anxious.     Objective:  BP 100/78 mmHg  Pulse 60  Temp(Src) 97.8 F (36.6 C) (Oral)  Resp 16  Ht 5\' 5"  (1.651 m)  Wt 178 lb (80.74 kg)  BMI 29.62 kg/m2  SpO2 98%  BP Readings from Last 3 Encounters:  02/09/15 100/78  02/02/15 108/60  01/27/15 132/80    Wt Readings from Last 3 Encounters:  02/09/15 178 lb (80.74 kg)  02/02/15 175 lb 4 oz (79.493 kg)  01/27/15 173 lb (78.472 kg)    Physical Exam  Constitutional: She is oriented to person, place, and time. She appears well-developed and well-nourished.  Non-toxic appearance. She does not  have a sickly appearance. She does not appear ill. No distress.  HENT:  Head: Normocephalic and atraumatic. Not macrocephalic and not microcephalic.  Mouth/Throat: Oropharynx is clear and moist. No oropharyngeal exudate.  Eyes: Conjunctivae are normal. Right eye exhibits no discharge. Left eye exhibits no discharge. No scleral icterus.  Neck: Normal range of motion. Neck supple. No JVD present. No tracheal deviation present. No thyromegaly present.  Cardiovascular: Normal rate, regular rhythm, normal heart sounds and intact distal pulses.  Exam reveals no gallop and no friction rub.   No murmur heard. Pulmonary/Chest: Effort normal and breath sounds normal. No stridor. No respiratory distress. She has no wheezes. She has no rales. She exhibits no tenderness.  Abdominal: Soft. Bowel sounds are normal. She exhibits no distension and no mass. There is no tenderness. There is no rebound and no guarding.  Musculoskeletal: Normal range of motion. She exhibits no edema or tenderness.  Lymphadenopathy:    She has no cervical adenopathy.  Neurological: She is alert and oriented to person, place, and time. She has normal reflexes. She displays no atrophy, no tremor and normal reflexes. No cranial nerve deficit or sensory deficit. She exhibits normal muscle tone. She displays no seizure activity. Coordination and gait normal. She displays no Babinski's sign on the right side. She displays no Babinski's sign on the left side.  Skin: Skin is warm and dry. No rash noted. She is not diaphoretic. No erythema. No pallor.  Psychiatric: She has a normal mood and affect. Her behavior is normal. Judgment and thought content normal.  Vitals reviewed.   Lab Results  Component Value Date   WBC 8.6 01/27/2015   HGB 13.8 01/27/2015   HCT 41.7 01/27/2015   PLT 271.0 01/27/2015   GLUCOSE 145* 01/27/2015   CHOL 144 01/27/2015   TRIG 124.0 01/27/2015   HDL 44.40 01/27/2015   LDLDIRECT 160.2 03/01/2010   LDLCALC 75  01/27/2015   ALT 10 01/27/2015   AST 12 01/27/2015   NA 140 01/27/2015   K 4.4 01/27/2015   CL 102 01/27/2015   CREATININE 0.88 01/27/2015   BUN 16 01/27/2015   CO2 30 01/27/2015   TSH 0.24* 01/27/2015   INR 1.05 07/16/2014   HGBA1C 8.5* 01/27/2015   MICROALBUR 30.9* 01/27/2015    No results found.  Assessment & Plan:   Asami was seen today for headache.  Diagnoses and all orders for this visit:  Intractable episodic headache, unspecified headache type- she has a new onset, intractable headache. Will get an MRI done to see if there is a mass, bleed, or concern for aneurysm. She also has components that are consistent with a tension/migraine/muscular contraction headache. I've asked her to start low-dose nortriptyline at bedtime. I will see how she responds to this and in the future consider increasing the dose if needed. She will continue Motrin as needed. -     nortriptyline (PAMELOR) 10 MG  capsule; Take 1 capsule (10 mg total) by mouth at bedtime. -     MR Brain Wo Contrast; Future   I have discontinued Ms. Busbin's Iron, ALPRAZolam, LEVEMIR FLEXTOUCH, and HUMALOG KWIKPEN. I am also having her start on nortriptyline. Additionally, I am having her maintain her Insulin Pen Needle, ezetimibe, Magnesium Oxide, gabapentin, aspirin EC, pantoprazole, Canagliflozin-Metformin HCl, glyBURIDE, clopidogrel, losartan-hydrochlorothiazide, LIVALO, Insulin Lispro (Human), Insulin Detemir, and ibuprofen.  Meds ordered this encounter  Medications  . DISCONTD: clopidogrel (PLAVIX) 75 MG tablet    Sig:   . DISCONTD: LEVEMIR FLEXTOUCH 100 UNIT/ML Pen    Sig:   . DISCONTD: HUMALOG KWIKPEN 200 UNIT/ML SOPN    Sig:   . nortriptyline (PAMELOR) 10 MG capsule    Sig: Take 1 capsule (10 mg total) by mouth at bedtime.    Dispense:  90 capsule    Refill:  5     Follow-up: Return in about 4 weeks (around 03/09/2015).  Scarlette Calico, MD

## 2015-02-09 NOTE — Progress Notes (Signed)
Pre visit review using our clinic review tool, if applicable. No additional management support is needed unless otherwise documented below in the visit note. 

## 2015-02-11 ENCOUNTER — Ambulatory Visit: Payer: Self-pay | Admitting: Women's Health

## 2015-02-16 LAB — COLOGUARD: COLOGUARD: NEGATIVE

## 2015-02-17 ENCOUNTER — Ambulatory Visit
Admission: RE | Admit: 2015-02-17 | Discharge: 2015-02-17 | Disposition: A | Discharge status: Home / Self Care | Payer: 59 | Source: Ambulatory Visit | Attending: Internal Medicine | Admitting: Internal Medicine

## 2015-02-17 ENCOUNTER — Ambulatory Visit: Payer: 59

## 2015-02-17 DIAGNOSIS — Z1231 Encounter for screening mammogram for malignant neoplasm of breast: Secondary | ICD-10-CM

## 2015-02-18 LAB — HM MAMMOGRAPHY

## 2015-02-25 ENCOUNTER — Other Ambulatory Visit: Payer: 59

## 2015-03-02 ENCOUNTER — Other Ambulatory Visit: Payer: Self-pay | Admitting: Internal Medicine

## 2015-03-04 ENCOUNTER — Encounter: Payer: 59 | Attending: Internal Medicine | Admitting: Dietician

## 2015-03-04 ENCOUNTER — Ambulatory Visit (INDEPENDENT_AMBULATORY_CARE_PROVIDER_SITE_OTHER): Payer: 59

## 2015-03-04 ENCOUNTER — Telehealth: Payer: Self-pay | Admitting: Internal Medicine

## 2015-03-04 ENCOUNTER — Encounter: Payer: Self-pay | Admitting: Dietician

## 2015-03-04 ENCOUNTER — Ambulatory Visit: Payer: 59

## 2015-03-04 VITALS — Ht 65.0 in | Wt 176.0 lb

## 2015-03-04 DIAGNOSIS — E538 Deficiency of other specified B group vitamins: Secondary | ICD-10-CM

## 2015-03-04 DIAGNOSIS — E1151 Type 2 diabetes mellitus with diabetic peripheral angiopathy without gangrene: Secondary | ICD-10-CM

## 2015-03-04 DIAGNOSIS — Z794 Long term (current) use of insulin: Secondary | ICD-10-CM | POA: Diagnosis not present

## 2015-03-04 DIAGNOSIS — Z713 Dietary counseling and surveillance: Secondary | ICD-10-CM | POA: Diagnosis not present

## 2015-03-04 MED ORDER — CYANOCOBALAMIN 1000 MCG/ML IJ SOLN
1000.0000 ug | Freq: Once | INTRAMUSCULAR | Status: AC
  Administered 2015-03-04: 1000 ug via INTRAMUSCULAR

## 2015-03-04 NOTE — Progress Notes (Signed)
Diabetes Self-Management Education  Visit Type: First/Initial  Appt. Start Time: 1415 Appt. End Time: 1093  03/04/2015  Ms. Theresa Harrington, identified by name and date of birth, is a 59 y.o. female with a diagnosis of Diabetes: Type 2.   Patient is here alone.  Hx includes HTN, high cholesterol, low vitamin B-12 (received a shot today), GERD, HLD, PAD. She has had one kidney removed. She had toe removed and surgery for PAD in February.  She is frustrated about her blood sugars.  She is depressed.  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  She takes the Humalog "if she remembers".    Hx also includes Lap band surgery in 2007.  Highest weight of 240 lbs at that time with loss to 158 lbs.  She felt good at 160 lbs.  Today's weight 176 lbs.  Her eating habits are very disordered.  "I added pepsi back and it was like crack!"  She gets most of her calories from beverages.  Diet is very low in protein.  She does not tolerate meat well and vomits at times with foods not tolerated.  She is not taking any vitamins except vitamin B-12 shot that she got today.    Nutrition focused physical exam completed with severe muscle loss in temple area.  All other WNL.  ASSESSMENT  Height 5\' 5"  (1.651 m), weight 176 lb (79.833 kg). Body mass index is 29.29 kg/(m^2).      Diabetes Self-Management Education - 03/04/15 1440    Visit Information   Visit Type First/Initial   Initial Visit   Diabetes Type Type 2   Are you currently following a meal plan? No   Are you taking your medications as prescribed? Yes  when i remember   Date Diagnosed 25-30 years ago   Health Coping   How would you rate your overall health? Fair;Other (comment)  "I'm blest"   Psychosocial Assessment   Patient Belief/Attitude about Diabetes Defeat/Burnout  "when i think i am doing good and my blood sugar is high I feel defeated."   Self-care barriers None    Self-management support Doctor's office   Other persons present Patient   Patient Concerns Weight Control;Glycemic Control;Nutrition/Meal planning   Preferred Learning Style No preference indicated   Learning Readiness Ready   How often do you need to have someone help you when you read instructions, pamphlets, or other written materials from your doctor or pharmacy? 1 - Never   What is the last grade level you completed in school? GED   Complications   Last HgB A1C per patient/outside source 8.5 %  01/27/15   How often do you check your blood sugar? 1-2 times/day   Fasting Blood glucose range (mg/dL) 70-129   Postprandial Blood glucose range (mg/dL) >200   Number of hypoglycemic episodes per month 0   Number of hyperglycemic episodes per week 1   Can you tell when your blood sugar is high? No   Have you had a dilated eye exam in the past 12 months? No   Have you had a dental exam in the past 12 months? No   Are you checking your feet? Yes   How many days per week are you checking your feet? 7   Dietary Intake   Breakfast coffee with splenda and cream most mornings, rare egg, bacon, toast   Snack (morning) none  Lunch none   Snack (afternoon) none, occasional apple or almonds or 100 cal snack bag   Dinner hot dog on bun with 3-4 chips OR chili beans and crackers   Snack (evening) none   Beverage(s) 1 bottle regular pepsi, sweet tea, coffee with splenda, cream and splenda   Exercise   Exercise Type Light (walking / raking leaves)   How many days per week to you exercise? 4   How many minutes per day do you exercise? 1   Total minutes per week of exercise 4   Patient Education   Previous Diabetes Education Yes (please comment)   Disease state  Explored patient's options for treatment of their diabetes;Factors that contribute to the development of diabetes   Nutrition management  Role of diet in the treatment of diabetes and the relationship between the three main macronutrients  and blood glucose level;Food label reading, portion sizes and measuring food.;Meal options for control of blood glucose level and chronic complications.   Physical activity and exercise  Role of exercise on diabetes management, blood pressure control and cardiac health.   Monitoring Identified appropriate SMBG and/or A1C goals.;Yearly dilated eye exam;Daily foot exams   Acute complications Taught treatment of hypoglycemia - the 15 rule.   Psychosocial adjustment Role of stress on diabetes;Worked with patient to identify barriers to care and solutions   Individualized Goals (developed by patient)   Nutrition General guidelines for healthy choices and portions discussed   Physical Activity Exercise 3-5 times per week;45 minutes per day   Medications take my medication as prescribed   Monitoring  test my blood glucose as discussed   Reducing Risk examine blood glucose patterns;do foot checks daily   Outcomes   Expected Outcomes Demonstrated interest in learning. Expect positive outcomes   Future DMSE 3-4 months   Program Status Completed      Individualized Plan for Diabetes Self-Management Training:   Learning Objective:  Patient will have a greater understanding of diabetes self-management. Patient education plan is to attend individual and/or group sessions per assessed needs and concerns.   Plan:   Patient Instructions  Have 4-5 small meals per day. You may have a protein shake for one of the meals Aim for 15-30 grams of carbohydrate with each meal. Aim for 15 grams of carbohydrate with each snack. Have small portion of protein with each meal (beans, cheese, fish, chicken, nuts, milk, yogurt) Rethink your drinks.  Prefer those without sugar. Form an exercise habit.  Do something you enjoy most days.  Continue the gym 4 days per week.   Expected Outcomes:  Demonstrated interest in learning. Expect positive outcomes  Education material provided: Living Well with Diabetes, Meal  plan card, My Plate and Snack sheet, Mailed patient Bariatric vitamin sheet and review of basic bariatric guidelines.  If problems or questions, patient to contact team via:  Phone and Email  Future DSME appointment: 3-4 months

## 2015-03-04 NOTE — Telephone Encounter (Signed)
Referral for Dr Richardean Canal .[name] KitchenReferral #[id number]  start date 03/04/15, exp 09/01/15 good for 6 visits. Faxed to [phone] [facility])

## 2015-03-04 NOTE — Patient Instructions (Signed)
Have 4-5 small meals per day. You may have a protein shake for one of the meals Aim for 15-30 grams of carbohydrate with each meal. Aim for 15 grams of carbohydrate with each snack. Have small portion of protein with each meal (beans, cheese, fish, chicken, nuts, milk, yogurt) Rethink your drinks.  Prefer those without sugar. Form an exercise habit.  Do something you enjoy most days.  Continue the gym 4 days per week.

## 2015-03-05 ENCOUNTER — Encounter: Payer: Self-pay | Admitting: Women's Health

## 2015-03-05 ENCOUNTER — Ambulatory Visit: Payer: 59

## 2015-03-05 ENCOUNTER — Ambulatory Visit (INDEPENDENT_AMBULATORY_CARE_PROVIDER_SITE_OTHER): Payer: 59 | Admitting: Women's Health

## 2015-03-05 ENCOUNTER — Other Ambulatory Visit (HOSPITAL_COMMUNITY)
Admission: RE | Admit: 2015-03-05 | Discharge: 2015-03-05 | Disposition: A | Discharge status: Home / Self Care | Payer: 59 | Source: Ambulatory Visit | Attending: Gynecology | Admitting: Gynecology

## 2015-03-05 VITALS — BP 124/78 | Ht 65.0 in | Wt 177.0 lb

## 2015-03-05 DIAGNOSIS — N950 Postmenopausal bleeding: Secondary | ICD-10-CM

## 2015-03-05 DIAGNOSIS — Z01411 Encounter for gynecological examination (general) (routine) with abnormal findings: Secondary | ICD-10-CM | POA: Insufficient documentation

## 2015-03-05 DIAGNOSIS — Z01419 Encounter for gynecological examination (general) (routine) without abnormal findings: Secondary | ICD-10-CM | POA: Diagnosis not present

## 2015-03-05 NOTE — Progress Notes (Signed)
Theresa Harrington 1956-03-06 [id number]    History:    Presents for new patient annual exam. Postmenopausal greater than 5 years with spotting for 1 week. Reports normal Pap and mammogram history, overdue for Pap. Type 2 diabetes with difficult management currently on insulin. Has had a second toe amputation. Has had a pneumonia vaccine. Same partner denies need for STD screening. Has not had a colonoscopy, had a Hemoccult test that was negative. Has had Pneumovax.  Past medical history, past surgical history, family history and social history were all reviewed and documented in the EPIC chart. 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 History of lap band surgery, tummy tuck and breast lift.  ROS:  A ROS was performed and pertinent positives and negatives are included.  Exam:  Filed Vitals:   03/05/15 1008  BP: 124/78    General appearance:  Normal Thyroid:  Symmetrical, normal in size, without palpable masses or nodularity. Respiratory  Auscultation:  Clear without wheezing or rhonchi Cardiovascular  Auscultation:  Regular rate, without rubs, murmurs or gallops  Edema/varicosities:  Not grossly evident Abdominal - tummy tuck  Soft,nontender, without masses, guarding or rebound.  Liver/spleen:  No organomegaly noted  Hernia:  None appreciated  Skin  Inspection:  Grossly normal   Breasts: Examined lying and sitting. Breast reduction and lift     Right: Without masses, retractions, discharge or axillary adenopathy.     Left: Without masses, retractions, discharge or axillary adenopathy. Gentitourinary   Inguinal/mons:  Normal without inguinal adenopathy  External genitalia:  Normal  BUS/Urethra/Skene's glands:  Normal  Vagina:  Normal with visible blood  Cervix:  Normal  Uterus:   normal in size, shape and contour.  Midline and mobile  Adnexa/parametria:     Rt: Without masses or tenderness.   Lt: Without masses or tenderness.  Anus and perineum: Normal  Digital rectal exam: Normal sphincter  tone without palpated masses or tenderness  Assessment/Plan:  59 y.o. S WF G0  for new patient annual exam with problem.  Postmenopausal spotting 1 week Type 2 diabetes on insulin, hypertension, Heart disease-primary care manages labs and meds  Plan: Sonohysterogram with Dr. Toney Rakes, will schedule. GC/Chlamydia. UA. Reviewed importance of regular daily walking, calcium rich foods, vitamin D 1000 daily. Home safety, fall prevention reviewed. Continue annual eye exams. SBE's, continue annual screening mammograms. Zostavax reviewed and encouraged. Pap with HR HPV typing, new screening guidelines reviewed.   Huel Cote [facility], 4:17 PM 03/05/2015

## 2015-03-05 NOTE — Patient Instructions (Signed)
Menopause is a normal process in which your reproductive ability comes to an end. This process happens gradually over a span of months to years, usually between the ages of 48 and 55. Menopause is complete when you have missed 12 consecutive menstrual periods. It is important to talk with your health care provider about some of the most common conditions that affect postmenopausal women, such as heart disease, cancer, and bone loss (osteoporosis). Adopting a healthy lifestyle and getting preventive care can help to promote your health and wellness. Those actions can also lower your chances of developing some of these common conditions. WHAT SHOULD I KNOW ABOUT MENOPAUSE? During menopause, you may experience a number of symptoms, such as:  Moderate-to-severe hot flashes.  Night sweats.  Decrease in sex drive.  Mood swings.  Headaches.  Tiredness.  Irritability.  Memory problems.  Insomnia. Choosing to treat or not to treat menopausal changes is an individual decision that you make with your health care provider. WHAT SHOULD I KNOW ABOUT HORMONE REPLACEMENT THERAPY AND SUPPLEMENTS? Hormone therapy products are effective for treating symptoms that are associated with menopause, such as hot flashes and night sweats. Hormone replacement carries certain risks, especially as you become older. If you are thinking about using estrogen or estrogen with progestin treatments, discuss the benefits and risks with your health care provider. WHAT SHOULD I KNOW ABOUT HEART DISEASE AND STROKE? Heart disease, heart attack, and stroke become more likely as you age. This may be due, in part, to the hormonal changes that your body experiences during menopause. These can affect how your body processes dietary fats, triglycerides, and cholesterol. Heart attack and stroke are both medical emergencies. There are many things that you can do to help prevent heart disease and stroke:  Have your blood pressure  checked at least every 1-2 years. High blood pressure causes heart disease and increases the risk of stroke.  If you are 55-79 years old, ask your health care provider if you should take aspirin to prevent a heart attack or a stroke.  Do not use any tobacco products, including cigarettes, chewing tobacco, or electronic cigarettes. If you need help quitting, ask your health care provider.  It is important to eat a healthy diet and maintain a healthy weight.  Be sure to include plenty of vegetables, fruits, low-fat dairy products, and lean protein.  Avoid eating foods that are high in solid fats, added sugars, or salt (sodium).  Get regular exercise. This is one of the most important things that you can do for your health.  Try to exercise for at least 150 minutes each week. The type of exercise that you do should increase your heart rate and make you sweat. This is known as moderate-intensity exercise.  Try to do strengthening exercises at least twice each week. Do these in addition to the moderate-intensity exercise.  Know your numbers.Ask your health care provider to check your cholesterol and your blood glucose. Continue to have your blood tested as directed by your health care provider. WHAT SHOULD I KNOW ABOUT CANCER SCREENING? There are several types of cancer. Take the following steps to reduce your risk and to catch any cancer development as early as possible. Breast Cancer  Practice breast self-awareness.  This means understanding how your breasts normally appear and feel.  It also means doing regular breast self-exams. Let your health care provider know about any changes, no matter how small.  If you are 40 or older, have a clinician do a   breast exam (clinical breast exam or CBE) every year. Depending on your age, family history, and medical history, it may be recommended that you also have a yearly breast X-ray (mammogram).  If you have a family history of breast cancer,  talk with your health care provider about genetic screening.  If you are at high risk for breast cancer, talk with your health care provider about having an MRI and a mammogram every year.  Breast cancer (BRCA) gene test is recommended for women who have family members with BRCA-related cancers. Results of the assessment will determine the need for genetic counseling and BRCA1 and for BRCA2 testing. BRCA-related cancers include these types:  Breast. This occurs in males or females.  Ovarian.  Tubal. This may also be called fallopian tube cancer.  Cancer of the abdominal or pelvic lining (peritoneal cancer).  Prostate.  Pancreatic. Cervical, Uterine, and Ovarian Cancer Your health care provider may recommend that you be screened regularly for cancer of the pelvic organs. These include your ovaries, uterus, and vagina. This screening involves a pelvic exam, which includes checking for microscopic changes to the surface of your cervix (Pap test).  For women ages 21-65, health care providers may recommend a pelvic exam and a Pap test every three years. For women ages 77-65, they may recommend the Pap test and pelvic exam, combined with testing for human papilloma virus (HPV), every five years. Some types of HPV increase your risk of cervical cancer. Testing for HPV may also be done on women of any age who have unclear Pap test results.  Other health care providers may not recommend any screening for nonpregnant women who are considered low risk for pelvic cancer and have no symptoms. Ask your health care provider if a screening pelvic exam is right for you.  If you have had past treatment for cervical cancer or a condition that could lead to cancer, you need Pap tests and screening for cancer for at least 20 years after your treatment. If Pap tests have been discontinued for you, your risk factors (such as having a new sexual partner) need to be reassessed to determine if you should start having  screenings again. Some women have medical problems that increase the chance of getting cervical cancer. In these cases, your health care provider may recommend that you have screening and Pap tests more often.  If you have a family history of uterine cancer or ovarian cancer, talk with your health care provider about genetic screening.  If you have vaginal bleeding after reaching menopause, tell your health care provider.  There are currently no reliable tests available to screen for ovarian cancer. Lung Cancer Lung cancer screening is recommended for adults 3-70 years old who are at high risk for lung cancer because of a history of smoking. A yearly low-dose CT scan of the lungs is recommended if you:  Currently smoke.  Have a history of at least 30 pack-years of smoking and you currently smoke or have quit within the past 15 years. A pack-year is smoking an average of one pack of cigarettes per day for one year. Yearly screening should:  Continue until it has been 15 years since you quit.  Stop if you develop a health problem that would prevent you from having lung cancer treatment. Colorectal Cancer  This type of cancer can be detected and can often be prevented.  Routine colorectal cancer screening usually begins at age 38 and continues through age 12.  If you have  risk factors for colon cancer, your health care provider may recommend that you be screened at an earlier age.  If you have a family history of colorectal cancer, talk with your health care provider about genetic screening.  Your health care provider may also recommend using home test kits to check for hidden blood in your stool.  A small camera at the end of a tube can be used to examine your colon directly (sigmoidoscopy or colonoscopy). This is done to check for the earliest forms of colorectal cancer.  Direct examination of the colon should be repeated every 5-10 years until age 67. However, if early forms of  precancerous polyps or small growths are found or if you have a family history or genetic risk for colorectal cancer, you may need to be screened more often. Skin Cancer  Check your skin from head to toe regularly.  Monitor any moles. Be sure to tell your health care provider:  About any new moles or changes in moles, especially if there is a change in a mole's shape or color.  If you have a mole that is larger than the size of a pencil eraser.  If any of your family members has a history of skin cancer, especially at a young age, talk with your health care provider about genetic screening.  Always use sunscreen. Apply sunscreen liberally and repeatedly throughout the day.  Whenever you are outside, protect yourself by wearing long sleeves, pants, a wide-brimmed hat, and sunglasses. WHAT SHOULD I KNOW ABOUT OSTEOPOROSIS? Osteoporosis is a condition in which bone destruction happens more quickly than new bone creation. After menopause, you may be at an increased risk for osteoporosis. To help prevent osteoporosis or the bone fractures that can happen because of osteoporosis, the following is recommended:  If you are 39-61 years old, get at least 1,000 mg of calcium and at least 600 mg of vitamin D per day.  If you are older than age 16 but younger than age 7, get at least 1,200 mg of calcium and at least 600 mg of vitamin D per day.  If you are older than age 47, get at least 1,200 mg of calcium and at least 800 mg of vitamin D per day. Smoking and excessive alcohol intake increase the risk of osteoporosis. Eat foods that are rich in calcium and vitamin D, and do weight-bearing exercises several times each week as directed by your health care provider. WHAT SHOULD I KNOW ABOUT HOW MENOPAUSE AFFECTS [place]? Depression may occur at any age, but it is more common as you become older. Common symptoms of depression include:  Low or sad mood.  Changes in sleep patterns.  Changes  in appetite or eating patterns.  Feeling an overall lack of motivation or enjoyment of activities that you previously enjoyed.  Frequent crying spells. Talk with your health care provider if you think that you are experiencing depression. WHAT SHOULD I KNOW ABOUT IMMUNIZATIONS? It is important that you get and maintain your immunizations. These include:  Tetanus, diphtheria, and pertussis (Tdap) booster vaccine.  Influenza every year before the flu season begins.  Pneumonia vaccine.  Shingles vaccine. Your health care provider may also recommend other immunizations.   This information is not intended to replace advice given to you by your health care provider. Make sure you discuss any questions you have with your health care provider.   Document Released: 06/09/2005 Document Revised: 05/08/2014 Document Reviewed: 12/18/2013 Elsevier Interactive Patient Education 2016 Elsevier  Inc. Postmenopausal Bleeding Postmenopausal bleeding is any bleeding a woman has after she has entered into menopause. Menopause is the end of a woman's fertile years. After menopause, a woman no longer ovulates or has menstrual periods.  Postmenopausal bleeding can be caused by various things. Any type of postmenopausal bleeding, even if it appears to be a typical menstrual period, is concerning. This should be evaluated by your health care provider. Any treatment will depend on the cause of the bleeding. HOME CARE INSTRUCTIONS Monitor your condition for any changes. The following actions may help to alleviate any discomfort you are experiencing:  Avoid the use of tampons and douches as directed by your health care provider.  Change your pads frequently.  Get regular pelvic exams and Pap tests.  Keep all follow-up appointments for diagnostic tests as directed by your health care provider. SEEK MEDICAL CARE IF:   Your bleeding lasts more than 1 week.  You have abdominal pain.  You have bleeding with  sexual intercourse. SEEK IMMEDIATE MEDICAL CARE IF:   You have a fever, chills, headache, dizziness, muscle aches, and bleeding.  You have severe pain with bleeding.  You are passing blood clots.  You have bleeding and need more than 1 pad an hour.  You feel faint. MAKE SURE YOU:  Understand these instructions.  Will watch your condition.  Will get help right away if you are not doing well or get worse.   This information is not intended to replace advice given to you by your health care provider. Make sure you discuss any questions you have with your health care provider.   Document Released: 07/26/2005 Document Revised: 02/05/2013 Document Reviewed: 11/14/2012 Elsevier Interactive Patient Education 2016 [place] is a procedure to examine the inside of your uterus. This exam uses sound waves sent to a computer to make real-time pictures of the inside of your uterus. To get the best images, a germ-free, saltwater solution (sterile saline) is injected into your uterus through your vagina. A sonohysterogram can show whether there is scarring or abnormal growths inside your uterus. It can also show whether your uterus is an abnormal shape or whether the lining is too thin.  LET [facility] CARE PROVIDER KNOW ABOUT:  Any allergies you have.  All medicines you are taking, including vitamins, herbs, eyedrops, creams, and over-the-counter medicines.  Previous problems you or members of your family have had with the use of anesthetics.  Any blood disorders you have.  Previous surgeries you have had.  Medical conditions you have.  The dates of your last period.  Possibility of pregnancy. RISKS AND COMPLICATIONS Generally, a sonohysterogram is a safe procedure. However, as with any procedure, problems can occur. Possible problems include:  Bleeding.  Infection. BEFORE THE PROCEDURE  Your health care provider may have you take an  over-the-counter pain medicine.  You may get a prescription for antibiotic medicine.  Your health care provider may give you a pregnancy test before the procedure.  You will empty your bladder. PROCEDURE   You will lie down on the examining table with your knees raised or your feet in stirrups.  Your health care provider may do a pelvic exam before starting the procedure.  A slender, handheld device (transducer) will be lubricated and placed into your vagina.  The transducer will be positioned to send sound waves to your uterus.  The sound waves will bounce back to the transducer. They will be sent to a computer.  The  computer will turn the sound waves into live images.  Your health care provider will view the images on a screen during the procedure.  Your health care provider will remove the transducer from your vagina and use an instrument to widen the opening (speculum).  A swab will be used to clean the opening to your uterus (cervix).  A long, thin tube (catheter) will then be placed through your cervix into your uterus.  Your health care provider will fill your uterus with sterile saline through the catheter. You may feel some cramping.  The speculum will be removed.  The transducer will be placed back in your vagina to take more images. AFTER THE PROCEDURE After the procedure, it is typical to have light bleeding from your vagina, cramping, and watery vaginal discharge.    This information is not intended to replace advice given to you by your health care provider. Make sure you discuss any questions you have with your health care provider.   Document Released: 09/01/2013 Document Reviewed: 09/01/2013 Elsevier Interactive Patient Education [employer].

## 2015-03-06 LAB — GC/CHLAMYDIA PROBE AMP
CT Probe RNA: NEGATIVE
GC Probe RNA: NEGATIVE

## 2015-03-08 ENCOUNTER — Other Ambulatory Visit: Payer: Self-pay | Admitting: Gynecology

## 2015-03-08 DIAGNOSIS — N950 Postmenopausal bleeding: Secondary | ICD-10-CM

## 2015-03-08 LAB — CYTOLOGY - PAP

## 2015-03-09 ENCOUNTER — Telehealth: Payer: Self-pay | Admitting: Gynecology

## 2015-03-09 NOTE — Telephone Encounter (Signed)
03/09/15-I LM VM for pt that her [employer] stated that she has met her oop of $500 and has no deductible so her test will be covered at 100%, no copay.wl  [employer] [id number]. wl

## 2015-03-10 ENCOUNTER — Ambulatory Visit: Payer: Self-pay | Admitting: Podiatry

## 2015-03-15 ENCOUNTER — Ambulatory Visit (INDEPENDENT_AMBULATORY_CARE_PROVIDER_SITE_OTHER): Payer: 59 | Admitting: Gynecology

## 2015-03-15 ENCOUNTER — Encounter: Payer: Self-pay | Admitting: Gynecology

## 2015-03-15 ENCOUNTER — Other Ambulatory Visit: Payer: Self-pay | Admitting: Gynecology

## 2015-03-15 ENCOUNTER — Ambulatory Visit (INDEPENDENT_AMBULATORY_CARE_PROVIDER_SITE_OTHER): Payer: 59

## 2015-03-15 DIAGNOSIS — N83319 Acquired atrophy of ovary, unspecified side: Secondary | ICD-10-CM | POA: Diagnosis not present

## 2015-03-15 DIAGNOSIS — N9489 Other specified conditions associated with female genital organs and menstrual cycle: Secondary | ICD-10-CM

## 2015-03-15 DIAGNOSIS — N950 Postmenopausal bleeding: Secondary | ICD-10-CM

## 2015-03-15 DIAGNOSIS — N840 Polyp of corpus uteri: Secondary | ICD-10-CM | POA: Insufficient documentation

## 2015-03-15 DIAGNOSIS — D251 Intramural leiomyoma of uterus: Secondary | ICD-10-CM | POA: Diagnosis not present

## 2015-03-15 NOTE — Progress Notes (Signed)
    patient is a 59 year old who was seen for her annual exam by our nurse practitioner Elon Alas on 03/04/2014. Patient is been postmenopausal for greater than 5 years and have been reporting spotting over the past few weeks. Patient with no past history of any abnormal Pap smear. Recent Pap smear and GC and chlamydia culture were negative. She is here for sonohysterogram and possible endometrial  Biopsy. Patient reports no past history of any abnormal Pap smear and reports no past history of hormone replacement therapy.  Ultrasound/ sonohysterogram: Uterus measures 7.4 x 7.1 x 3.4 cm with endometrial stripe of 4.6 mm. Patient was 6 intramural myomas the largest one measuring 29 x 27 mm slightly indenting the endometrium. Right and left ovary atrophic. After the cervix was cleansed with Betadine solution normal saline was instilled into the uterine cavity an a posterior uterine polyp -like lesion was noted on the right side of the uterus measuring 10 x 3 x 6 mm.   The cervix was cleansed with Betadine solution once again and an endometrial biopsy was attempted with no tissue obtained.   Assessment/plan: Postmenopausal patient with postmenopausal bleeding sonohysterogram demonstrated an isolated endometrial polyp patient will be scheduled for outpatient resectoscopic polypectomy. We'll obtain medical  Clearance prior to her surgery and I will see her the week before for physical exam and consultation. Literature information provided.

## 2015-03-17 ENCOUNTER — Telehealth: Payer: Self-pay

## 2015-03-17 ENCOUNTER — Telehealth: Payer: Self-pay | Admitting: Internal Medicine

## 2015-03-17 NOTE — Telephone Encounter (Signed)
Cathy from [facility] is needing surgery clearance for patient. Her surgery is set for 12/27. She is hoping you can just put something in epic Cathy's number is [phone]

## 2015-03-17 NOTE — Telephone Encounter (Signed)
LMOVM for further information

## 2015-03-17 NOTE — Telephone Encounter (Signed)
What kind of surgery?

## 2015-03-17 NOTE — Telephone Encounter (Signed)
I spoke with Cecille Rubin at [facility]. Patient is scheduled for Resectoscopic Polypectomy for 04/27/15 and Dr. Toney Rakes would like her to have medical clearance.  Cecille Rubin said she will send note back to nurse and see what we need to do to accomplish this. She asked if I had a form for him to sign and I do not. I asked her if he clears the patient that a note in her chart will suffice since we are also in [place].

## 2015-03-19 NOTE — Telephone Encounter (Signed)
Called to get form faxed..will route to pcp

## 2015-03-23 ENCOUNTER — Telehealth: Payer: Self-pay | Admitting: Internal Medicine

## 2015-03-23 NOTE — Telephone Encounter (Signed)
Pt called in and wants to know why she is being referred to have a ultra sound at [facility] imaging.  She is not sure what it is about and would like a nurse to call her

## 2015-03-24 NOTE — Telephone Encounter (Signed)
Informed pt that [place] was placed by gyn.

## 2015-03-29 ENCOUNTER — Other Ambulatory Visit: Payer: Self-pay | Admitting: *Deleted

## 2015-03-29 ENCOUNTER — Other Ambulatory Visit: Payer: Self-pay

## 2015-03-29 ENCOUNTER — Telehealth: Payer: Self-pay | Admitting: Internal Medicine

## 2015-03-29 ENCOUNTER — Telehealth: Payer: Self-pay | Admitting: *Deleted

## 2015-03-29 DIAGNOSIS — E1151 Type 2 diabetes mellitus with diabetic peripheral angiopathy without gangrene: Secondary | ICD-10-CM

## 2015-03-29 DIAGNOSIS — I739 Peripheral vascular disease, unspecified: Secondary | ICD-10-CM

## 2015-03-29 MED ORDER — INSULIN PEN NEEDLE 32G X 4 MM MISC: Status: DC

## 2015-03-29 MED ORDER — PANTOPRAZOLE SODIUM 40 MG PO TBEC: 40.0000 mg | DELAYED_RELEASE_TABLET | Freq: Every day | ORAL | Status: DC

## 2015-03-29 MED ORDER — EZETIMIBE 10 MG PO TABS: 10.0000 mg | ORAL_TABLET | Freq: Every day | ORAL | Status: DC

## 2015-03-29 MED ORDER — CANAGLIFLOZIN-METFORMIN HCL 150-1000 MG PO TABS
1.0000 | ORAL_TABLET | Freq: Two times a day (BID) | ORAL | Status: DC
Start: 2015-03-29 — End: 2015-08-04

## 2015-03-29 MED ORDER — CLOPIDOGREL BISULFATE 75 MG PO TABS: 75.0000 mg | ORAL_TABLET | Freq: Every day | ORAL | Status: DC

## 2015-03-29 MED ORDER — GLYBURIDE 5 MG PO TABS: 5.0000 mg | ORAL_TABLET | Freq: Every day | ORAL | Status: DC

## 2015-03-29 NOTE — Telephone Encounter (Signed)
PT CALLED ASKING IF ULTRASOUND SHOULD BE SCHEDULED, NO ORDER FOR ULTRASOUND AND [facility] DIDN'T MENTION ORDERING U/S IN RECENT NOTE, I CALLED PT BACK AND TOLD HER TO JUST FOLLOW UP FOR PRE-OP APPOINTMENT

## 2015-03-29 NOTE — Telephone Encounter (Signed)
Patient states her insurance will run out the end of December.  She states she will not have medicare until February.  She is requesting that Dr. [name] sends 72 day supplies of all her medications to [facility] in [place].  Patient is requesting follow up call in regards.

## 2015-03-29 NOTE — Telephone Encounter (Signed)
All that's can can be sent have

## 2015-03-30 LAB — HM DIABETES EYE EXAM

## 2015-03-31 ENCOUNTER — Encounter: Payer: Self-pay | Admitting: Gynecology

## 2015-04-01 ENCOUNTER — Ambulatory Visit (INDEPENDENT_AMBULATORY_CARE_PROVIDER_SITE_OTHER): Payer: 59 | Admitting: *Deleted

## 2015-04-01 DIAGNOSIS — E538 Deficiency of other specified B group vitamins: Secondary | ICD-10-CM

## 2015-04-01 MED ORDER — CYANOCOBALAMIN 1000 MCG/ML IJ SOLN
1000.0000 ug | Freq: Once | INTRAMUSCULAR | Status: AC
  Administered 2015-04-01: 1000 ug via INTRAMUSCULAR

## 2015-04-02 ENCOUNTER — Telehealth: Payer: Self-pay

## 2015-04-02 NOTE — Telephone Encounter (Signed)
Patient called because she has surgery scheduled 12.27.16 at [facility] and has not heard from [facility]. I assured her it is a little too early for them to be calling but she will hear from them with all her instructions.  She questioned if she needed an ultrasound. Not sure why she thought that. She just had [facility] several weeks ago and I advised her no u/s needed.

## 2015-04-05 ENCOUNTER — Ambulatory Visit: Payer: 59

## 2015-04-06 ENCOUNTER — Encounter: Payer: Self-pay | Admitting: Surgery

## 2015-04-09 ENCOUNTER — Other Ambulatory Visit: Payer: Self-pay | Admitting: Surgery

## 2015-04-09 ENCOUNTER — Ambulatory Visit (INDEPENDENT_AMBULATORY_CARE_PROVIDER_SITE_OTHER)
Admission: RE | Admit: 2015-04-09 | Discharge: 2015-04-09 | Disposition: A | Discharge status: Home / Self Care | Payer: 59 | Source: Ambulatory Visit | Attending: Surgery | Admitting: Surgery

## 2015-04-09 ENCOUNTER — Ambulatory Visit (HOSPITAL_COMMUNITY)
Admission: RE | Admit: 2015-04-09 | Discharge: 2015-04-09 | Disposition: A | Discharge status: Home / Self Care | Payer: 59 | Source: Ambulatory Visit | Attending: Vascular Surgery | Admitting: Vascular Surgery

## 2015-04-09 DIAGNOSIS — L97509 Non-pressure chronic ulcer of other part of unspecified foot with unspecified severity: Secondary | ICD-10-CM

## 2015-04-09 DIAGNOSIS — I70299 Other atherosclerosis of native arteries of extremities, unspecified extremity: Secondary | ICD-10-CM

## 2015-04-09 DIAGNOSIS — Z48812 Encounter for surgical aftercare following surgery on the circulatory system: Secondary | ICD-10-CM | POA: Diagnosis not present

## 2015-04-09 DIAGNOSIS — I739 Peripheral vascular disease, unspecified: Secondary | ICD-10-CM | POA: Diagnosis not present

## 2015-04-12 ENCOUNTER — Ambulatory Visit (INDEPENDENT_AMBULATORY_CARE_PROVIDER_SITE_OTHER): Payer: 59 | Admitting: Surgery

## 2015-04-12 ENCOUNTER — Encounter: Payer: Self-pay | Admitting: Surgery

## 2015-04-12 VITALS — BP 127/57 | HR 57 | Ht 65.0 in | Wt 179.0 lb

## 2015-04-12 DIAGNOSIS — I70299 Other atherosclerosis of native arteries of extremities, unspecified extremity: Secondary | ICD-10-CM | POA: Diagnosis not present

## 2015-04-12 DIAGNOSIS — L97509 Non-pressure chronic ulcer of other part of unspecified foot with unspecified severity: Secondary | ICD-10-CM | POA: Diagnosis not present

## 2015-04-12 NOTE — Progress Notes (Signed)
Patient name: Theresa Harrington MRN: [id number] DOB: Sep 07, 1955 Sex: female     Chief Complaint  Patient presents with  . Re-evaluation    6 month f/u     HISTORY OF PRESENT ILLNESS: This is a 59 y.o. female who is initially underwent left femoral-popliteal bypass graft by Dr. early on 02/06/2015. Unfortunately this occluded early. I took her back to the operating room on 06/09/2014 and redid her operation with Gore-Tex, 6 mm propatent. She was ultimately discharged from the hospital on IV antibiotics. She then began hyperbaric oxygen treatment. She ultimately developed necrotic changes to the left second toe. 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  She does not complain of any swelling in the leg.  She has no wounds.  She does state that her leg started bothering her again in approximately 2 months ago.  Past Medical History  Diagnosis Date  . PONV (postoperative nausea and vomiting)   . Right ureteral stone   . PAD (peripheral artery disease) ([place])   . Atherosclerosis of aorta ([place])   . GERD (gastroesophageal reflux disease)   . Asymptomatic cholelithiasis   . Peripheral arterial occlusive disease ([facility])     lower extremities  . Wears glasses   . Hypothyroidism     no meds  . Type 2 diabetes mellitus ([place])   . Anemia   . Anxiety   . Pneumonia     Past Surgical History  Procedure Laterality Date  . Belpharoptosis repair      eyelid lift  . Cystoscopy with stent placement Right 06/04/2013    Procedure: CYSTOSCOPY WITH STENT PLACEMENT;  Surgeon: Franchot Gallo, MD;  Location: [facility];  Service: Urology;  Laterality: Right;  . Right knee patellectomy w/ repair of extensor mechanism  04-14-2002  . Orif fifth metacarpal fx  right hand  04-21-2002  . Laparoscopic gastric banding  05-29-2005  . Revision and re-siting lap-band port  04-08-2010    W/  UPPER EGD  . Combined augmentation mammaplasty and abdominoplasty  2009    W/  BILATERAL  THIGH LIFT  .  Cysto/  right ureteral stent placement  12-30-2010  . Extracorporeal shock wave lithotripsy Right 08-04-2013//   06-23-2013//   01-16-2011  . Cystoscopy with retrograde pyelogram, ureteroscopy and stent placement Right 10/07/2013    Procedure: CYSTOSCOPY WITH RETROGRADE PYELOGRAM, right URETEROSCOPY AND STENT PLACEMENT, stone extraction;  Surgeon: Arvil Persons, MD;  Location: [facility];  Service: Urology;  Laterality: Right;  . Holmium laser application Right [id number]    Procedure: HOLMIUM LASER APPLICATION;  Surgeon: Arvil Persons, MD;  Location: [facility];  Service: Urology;  Laterality: Right;  . Kidney stone surgery  April 2015    1-2 stones  . Toenail removed Left Jan. 21, 2016    2nd toenail-  Dr. Barkley Bruns  . Augmentation mammaplasty    . Lithotripsy  2-3 times  . Lower extremity angiogram N/A 06/04/2014    Procedure: LOWER EXTREMITY ANGIOGRAM;  Surgeon: Serafina Mitchell, MD;  Location: [facility] CATH LAB;  Service: Cardiovascular;  Laterality: N/A;  . Endarterectomy femoral Left 06/08/2014    Procedure: Left Leg Common Femoral and External Iliac  Endartarectomy with patch Angioplasty;  Surgeon: Rosetta Posner, MD;  Location: [facility];  Service: Vascular;  Laterality: Left;  . Femoral-popliteal bypass graft Left 06/08/2014    Procedure: Left Leg Femoral -Popliteal Bypass Graft;  Surgeon: Rosetta Posner, MD;  Location: [facility];  Service:  Vascular;  Laterality: Left;  . Femoral-popliteal bypass graft Left 06/09/2014    Procedure: Left Femoral and Popliteal Exposure; Left Femoral to Anterior Tibial Bypass Graft using Propaten 74mm by 80cm Goretex Graft; Left Tibial Endarterectomy; Left Femoraland Popliteal Thrombectomy ;  Surgeon: Serafina Mitchell, MD;  Location: [facility];  Service: Vascular;  Laterality: Left;  . Amputation Left 07/16/2014    Procedure: LEFT SECOND TOE AMPUTATION;  Surgeon: Serafina Mitchell, MD;  Location: [facility];  Service: Vascular;  Laterality: Left;  With Nerve block     Social History   Social History  . Marital Status: Single    Spouse Name: N/A  . Number of Children: N/A  . Years of Education: N/A   Occupational History  . tester    Social History Main Topics  . Smoking status: Former Smoker -- 1.00 packs/day for 28 years    Types: Cigarettes    Quit date: 09/05/2001  . Smokeless tobacco: Never Used  . Alcohol Use: No  . Drug Use: No  . Sexual Activity: Yes   Other Topics Concern  . Not on file   Social History Narrative   Regular exercise- yes    Family History  Problem Relation Age of Onset  . Arthritis Other   . Cancer Other     colon  . Hypertension Other   . Stroke Other   . Diabetes Father   . Heart disease Father   . Deep vein thrombosis Father   . Hyperlipidemia Father   . Diabetes Sister   . Heart disease Sister   . Deep vein thrombosis Sister   . Hyperlipidemia Sister   . Diabetes Brother   . Heart disease Brother   . Cancer Brother   . Hyperlipidemia Brother   . Colon cancer Maternal Aunt   . Diabetes Brother   . Diabetes Brother   . Kidney disease Mother   . Hyperlipidemia Mother   . Other Mother     AAA   and    Amputation    Allergies as of 04/12/2015 - Review Complete 04/12/2015  Allergen Reaction Noted  . Amlodipine Other (See Comments) 11/26/2013  . Atorvastatin Other (See Comments) 10/28/2013  . Ampicillin Nausea And Vomiting 04/12/2011  . Codeine Nausea And Vomiting   . Lisinopril Other (See Comments) 10/05/2008  . Penicillins Nausea And Vomiting 05/01/2011  . Januvia [sitagliptin]  12/21/2011    Current Outpatient Prescriptions on File Prior to Visit  Medication Sig Dispense Refill  . aspirin EC 325 MG tablet Take 325 mg by mouth daily.    . Canagliflozin-Metformin HCl [phone] MG TABS Take 1 tablet by mouth 2 (two) times daily. 180 tablet 1  . clopidogrel (PLAVIX) 75 MG tablet Take 1 tablet (75 mg total) by mouth daily. 90 tablet 3  . ezetimibe (ZETIA) 10 MG tablet Take 1 tablet (10  mg total) by mouth daily. 90 tablet 3  . glyBURIDE (DIABETA) 5 MG tablet Take 1 tablet (5 mg total) by mouth daily with breakfast. 90 tablet 1  . ibuprofen (ADVIL,MOTRIN) 600 MG tablet Take 1 tablet (600 mg total) by mouth every 8 (eight) hours as needed. 30 tablet 0  . Insulin Detemir (LEVEMIR FLEXTOUCH) 100 UNIT/ML Pen Inject 60 Units into the skin daily at 10 pm. DX 250.72 15 mL 11  . Insulin Lispro, Human, (HUMALOG KWIKPEN) 200 UNIT/ML SOPN Inject 5 Units into the skin 3 (three) times daily with meals as needed. 3 mL 11  . Insulin Pen  Needle (NOVOFINE PLUS) 32G X 4 MM MISC Use to inject once a day Dx 250.00 1000 each 1  . LIVALO 2 MG TABS TAKE ONE TABLET BY MOUTH ONCE DAILY 90 tablet 3  . losartan-hydrochlorothiazide (HYZAAR) 100-12.5 MG per tablet TAKE ONE TABLET BY MOUTH ONCE DAILY 90 tablet 2  . Magnesium Oxide 400 MG CAPS Take 1 capsule (400 mg total) by mouth daily. 90 capsule 3  . nortriptyline (PAMELOR) 10 MG capsule Take 1 capsule (10 mg total) by mouth at bedtime. 90 capsule 5  . pantoprazole (PROTONIX) 40 MG tablet Take 1 tablet (40 mg total) by mouth daily. 90 tablet 3   No current facility-administered medications on file prior to visit.     REVIEW OF SYSTEMS: Cardiovascular: No chest pain, chest pressure, palpitations, orthopnea, or dyspnea on exertion.  Pain in right leg 2 months Pulmonary: No productive cough, asthma or wheezing. Neurologic: No weakness, paresthesias, aphasia, or amaurosis. No dizziness. Hematologic: No bleeding problems or clotting disorders. Musculoskeletal: No joint pain or joint swelling. Gastrointestinal: No blood in stool or hematemesis Genitourinary: No dysuria or hematuria. Psychiatric:: No history of major depression. Integumentary: No rashes or ulcers. Constitutional: No fever or chills.  PHYSICAL EXAMINATION:   Vital signs are  Filed Vitals:   04/12/15 1436  BP: 127/57  Pulse: 57  Height: 5\' 5"  (1.651 m)  Weight: 179 lb (81.194 kg)   SpO2: 100%   Body mass index is 29.79 kg/(m^2). General: The patient appears their stated age. HEENT:  No gross abnormalities Pulmonary:  Non labored breathing Musculoskeletal: There are no major deformities. Neurologic: No focal weakness or paresthesias are detected, Skin: No open wounds on the left leg.  Healed toe amputation site. Psychiatric: The patient has normal affect. Cardiovascular: Pedal pulses not palpable   Diagnostic Studies I have ordered and reviewed her duplex ultrasound.  This shows that her bypass graft in the left leg has occluded.  Her ABI 0.68.  Assessment: Occluded left femoral bypass graft Plan: The patient has had a silent occlusion of her bypass graft.  She does endorse having some claudication symptoms.  She does not have rest pain or ulcers.  I discussed that she must take excellent care of her feet.  She will contact me if she develops a new wound.  I have her scheduled for follow-up in one year with ABIs.  Eldridge Abrahams, M.D. [facility] Office: [phone] Pager:  [phone]

## 2015-04-12 NOTE — Addendum Note (Signed)
Addended by: Dorthula Rue L on: 04/12/2015 04:20 PM   Modules accepted: Orders

## 2015-04-13 ENCOUNTER — Ambulatory Visit: Payer: 59 | Admitting: Internal Medicine

## 2015-04-14 ENCOUNTER — Encounter (HOSPITAL_COMMUNITY): Payer: Self-pay

## 2015-04-14 ENCOUNTER — Telehealth: Payer: Self-pay | Admitting: Cardiology

## 2015-04-14 ENCOUNTER — Other Ambulatory Visit: Payer: Self-pay

## 2015-04-14 ENCOUNTER — Telehealth: Payer: Self-pay

## 2015-04-14 ENCOUNTER — Encounter (HOSPITAL_COMMUNITY)
Admission: RE | Admit: 2015-04-14 | Discharge: 2015-04-14 | Disposition: A | Discharge status: Home / Self Care | Payer: 59 | Source: Ambulatory Visit | Attending: Gynecology | Admitting: Gynecology

## 2015-04-14 DIAGNOSIS — Z01818 Encounter for other preprocedural examination: Secondary | ICD-10-CM | POA: Insufficient documentation

## 2015-04-14 DIAGNOSIS — N950 Postmenopausal bleeding: Secondary | ICD-10-CM | POA: Diagnosis not present

## 2015-04-14 DIAGNOSIS — N840 Polyp of corpus uteri: Secondary | ICD-10-CM | POA: Diagnosis not present

## 2015-04-14 LAB — CBC
HCT: 38.3 % (ref 36.0–46.0)
Hemoglobin: 12.6 g/dL (ref 12.0–15.0)
MCH: 28.6 pg (ref 26.0–34.0)
MCHC: 32.9 g/dL (ref 30.0–36.0)
MCV: 87 fL (ref 78.0–100.0)
PLATELETS: 296 10*3/uL (ref 150–400)
RBC: 4.4 MIL/uL (ref 3.87–5.11)
RDW: 13.9 % (ref 11.5–15.5)
WBC: 9.2 10*3/uL (ref 4.0–10.5)

## 2015-04-14 LAB — BASIC METABOLIC PANEL
Anion gap: 9 (ref 5–15)
BUN: 18 mg/dL (ref 6–20)
CALCIUM: 9.7 mg/dL (ref 8.9–10.3)
CHLORIDE: 104 mmol/L (ref 101–111)
CO2: 27 mmol/L (ref 22–32)
CREATININE: 0.96 mg/dL (ref 0.44–1.00)
Glucose, Bld: 188 mg/dL — ABNORMAL HIGH (ref 65–99)
Potassium: 4.1 mmol/L (ref 3.5–5.1)
SODIUM: 140 mmol/L (ref 135–145)

## 2015-04-14 NOTE — Telephone Encounter (Signed)
Patient has appt scheduled with Dr. [name] for 04/21/15. I added appt note to let him know it was regarding her uncontrolled blood sugars and I faxed a medical release form to Dr. [name].

## 2015-04-14 NOTE — Telephone Encounter (Signed)
Please check with her PCP who has her on the Plavix. Make sure we have medical clearance. This is a short case that I do not want her to bleed. Please check with them as to when they want [place] to stop the Plavix and when she is to restart it.

## 2015-04-14 NOTE — Telephone Encounter (Signed)
Patient is there for pre-op appt. Patient is on Plavix but did not know if she was to stop it prior to surgery. Marcie Bal RN said Dr. Trula Slade is her Plavix doctor.  She said she checked with anesthesia and they said it will be up to you and Dr. Trula Slade when/if she stops Plavix before surgery.  Patient was cleared for surgery by Dr. [name] her PCP.  She is scheduled for D&C Hysteroscopy on 04/27/15.

## 2015-04-14 NOTE — Patient Instructions (Addendum)
Your procedure is scheduled on:04/27/15  Enter through the Main Entrance at :[facility] desk phone and dial [phone] and inform us of your arrival.  Please call [phone] if you have any problems the morning of surgery.  Remember: Do not eat food or drink liquids, including water, after midnight:Monday 04/26/15    You may brush your teeth the morning of surgery.  Take these meds the morning of surgery with a sip of water: BP pill, Protonix HOLD METFORMIN FOR 24 HOURS PRIOR TO SURGERY. TAKE 1/2 DOSE OF INSULIN Monday 12/26/16Your procedure is scheduled on:  DO NOT wear jewelry, eye make-up, lipstick,body lotion, or dark fingernail polish.  (Polished toes are ok) You may wear deodorant.  If you are to be admitted after surgery, leave suitcase in car until your room has been assigned. Patients discharged on the day of surgery will not be allowed to drive home. Wear loose fitting, comfortable clothes for your ride home.   AT 10PM  DO NOT wear jewelry, eye make-up, lipstick,body lotion, or dark fingernail polish.  (Polished toes are ok) You may wear deodorant.  If you are to be admitted after surgery, leave suitcase in car until your room has been assigned. Patients discharged on the day of surgery will not be allowed to drive home. Wear loose fitting, comfortable clothes for your ride home.

## 2015-04-14 NOTE — Telephone Encounter (Signed)
I called Dr. Stephens Shire office and left message with the triage line inquiring how to handle patient's Plavix pre-operatively and to get her medical clearance for surgery.

## 2015-04-14 NOTE — Telephone Encounter (Signed)
error 

## 2015-04-14 NOTE — Telephone Encounter (Signed)
I contacted patient to let her know that Dr. Moshe Salisbury is concerned because her blood glucose level today was 188.  She said she was not fasting and had just had breakfast. I asked her does she check her FBS in the morning. She said she does and today it was 88.  I asked her if she ever checks it other times of the day and she said she checked it last night at bedtime and it was over 200.  I told her Dr. Moshe Salisbury recommended that she get back to see Dr. [name] and get her blood sugar under control prior to surgery.  I offered to call and speak with them and see when I can get her in.  She said no, that she would call as she said I do not know her schedule and when she can go. I told her that we will need for Dr. [name] to clear her for surgery again in regards to her blood sugar.  I will watch the appointments app and see when she gets appointment with Dr. [name]. I will fax a medical clearance form to them again.

## 2015-04-15 NOTE — Telephone Encounter (Signed)
5 days would be enough. Its a short minor procedure

## 2015-04-15 NOTE — Telephone Encounter (Signed)
I called nurse Arbie Cookey back and left her a message with this information.

## 2015-04-15 NOTE — Telephone Encounter (Signed)
Dr. Stephens Shire nurse called in my voice mail wanting to know if you wanted patient off her Plavix for 5 or 7 days prior to surgery?  She said different surgeons prefer different things.  She is checking with Dr. Trula Slade to see if okay for her to d/c Plavix for 5-7 days before.

## 2015-04-16 ENCOUNTER — Telehealth: Payer: Self-pay

## 2015-04-16 NOTE — Telephone Encounter (Signed)
-----   Message from Serafina Mitchell, MD sent at 04/15/2015  8:03 PM EST ----- Regarding: RE: Plavix yes ----- Message -----    From: Denman George, RN    Sent: 04/14/2015   4:38 PM      To: Serafina Mitchell, MD Subject: Plavix                                         rec'd call from Dr. Sandrea Hughs office @ [facility]; pt. Scheduled for outpatient Resectoscopic Polypectomy 04/27/15; they are requesting Vascular surgery clearance, and approval to hold Plavix 5-7 days prior to procedure.  You last saw her on 04/12/15.  Are you able to clear her from VS standpoint?  Is is safe for her to hold Plavix 5-7 days prior to procedure.

## 2015-04-16 NOTE — Telephone Encounter (Signed)
Per Dr. Stephens Shire note in chart and Dr. Durenda Guthrie recommendation to d/c 5 days prior I spoke with the patient and informed her to discontinue her Plavix 5 days prior to surgery. She will be seeing Dr. [name] on 04/21/15 regarding glucose and if he clears her she will stop taking Plavix the following day.

## 2015-04-16 NOTE — Telephone Encounter (Signed)
I spoke with patient and informed her to discontinue her Plavix 5 days prior to surgery. She will be seeing Dr. [name] on 04/21/15 regarding glucose and if he clears her she will stop taking Plavix the following day.

## 2015-04-21 ENCOUNTER — Encounter: Payer: Self-pay | Admitting: Gynecology

## 2015-04-21 ENCOUNTER — Ambulatory Visit (INDEPENDENT_AMBULATORY_CARE_PROVIDER_SITE_OTHER): Payer: 59 | Admitting: Gynecology

## 2015-04-21 ENCOUNTER — Encounter: Payer: Self-pay | Admitting: Internal Medicine

## 2015-04-21 ENCOUNTER — Ambulatory Visit (INDEPENDENT_AMBULATORY_CARE_PROVIDER_SITE_OTHER): Payer: 59 | Admitting: Internal Medicine

## 2015-04-21 VITALS — BP 130/72 | HR 60 | Temp 97.5°F | Resp 16 | Ht 65.0 in | Wt 177.0 lb

## 2015-04-21 VITALS — BP 130/86

## 2015-04-21 DIAGNOSIS — E1151 Type 2 diabetes mellitus with diabetic peripheral angiopathy without gangrene: Secondary | ICD-10-CM

## 2015-04-21 DIAGNOSIS — N840 Polyp of corpus uteri: Secondary | ICD-10-CM

## 2015-04-21 DIAGNOSIS — N950 Postmenopausal bleeding: Secondary | ICD-10-CM | POA: Diagnosis not present

## 2015-04-21 DIAGNOSIS — I10 Essential (primary) hypertension: Secondary | ICD-10-CM

## 2015-04-21 DIAGNOSIS — Z01818 Encounter for other preprocedural examination: Secondary | ICD-10-CM | POA: Diagnosis not present

## 2015-04-21 MED ORDER — EZETIMIBE 10 MG PO TABS: 10.0000 mg | ORAL_TABLET | Freq: Every day | ORAL | Status: DC

## 2015-04-21 NOTE — Progress Notes (Signed)
Subjective:  Patient ID: Theresa Harrington, female    DOB: [birth year]  Age: 59 y.o. MRN: [id number]  CC: Diabetes   HPI Theresa Harrington presents for follow-up on diabetes. Her blood sugars today have been 88 and 132. She feels well and offers no complaints.  Outpatient Prescriptions Prior to Visit  Medication Sig Dispense Refill  . aspirin EC 325 MG tablet Take 325 mg by mouth daily.    . Canagliflozin-Metformin HCl [phone] MG TABS Take 1 tablet by mouth 2 (two) times daily. 180 tablet 1  . clopidogrel (PLAVIX) 75 MG tablet Take 1 tablet (75 mg total) by mouth daily. 90 tablet 3  . glyBURIDE (DIABETA) 5 MG tablet Take 1 tablet (5 mg total) by mouth daily with breakfast. 90 tablet 1  . ibuprofen (ADVIL,MOTRIN) 600 MG tablet Take 1 tablet (600 mg total) by mouth every 8 (eight) hours as needed. (Patient taking differently: Take 600 mg by mouth every 8 (eight) hours as needed for headache or mild pain. ) 30 tablet 0  . Insulin Detemir (LEVEMIR FLEXTOUCH) 100 UNIT/ML Pen Inject 60 Units into the skin daily at 10 pm. DX 250.72 15 mL 11  . Insulin Lispro, Human, (HUMALOG KWIKPEN) 200 UNIT/ML SOPN Inject 5 Units into the skin 3 (three) times daily with meals as needed. (Patient taking differently: Inject 5 Units into the skin 3 (three) times daily with meals as needed (for high blood sugar). ) 3 mL 11  . losartan-hydrochlorothiazide (HYZAAR) 100-12.5 MG tablet Take 1 tablet by mouth daily.    . magnesium oxide (MAG-OX) 400 MG tablet Take 400 mg by mouth daily.    . nortriptyline (PAMELOR) 10 MG capsule Take 1 capsule (10 mg total) by mouth at bedtime. 90 capsule 5  . pantoprazole (PROTONIX) 40 MG tablet Take 1 tablet (40 mg total) by mouth daily. 90 tablet 3  . Pitavastatin Calcium (LIVALO) 2 MG TABS Take 2 mg by mouth daily.    [name] ezetimibe (ZETIA) 10 MG tablet Take 1 tablet (10 mg total) by mouth daily. 90 tablet 3   No facility-administered medications prior to visit.    ROS Review of Systems    Constitutional: Negative for fever, chills, diaphoresis, appetite change and fatigue.  HENT: Negative.   Eyes: Negative.   Respiratory: Negative.  Negative for cough, choking, chest tightness, shortness of breath and stridor.   Cardiovascular: Negative.  Negative for chest pain, palpitations and leg swelling.  Gastrointestinal: Negative.  Negative for vomiting, abdominal pain, constipation and blood in stool.  Endocrine: Negative.  Negative for polydipsia, polyphagia and polyuria.  Genitourinary: Negative.   Musculoskeletal: Negative.  Negative for myalgias, back pain and arthralgias.  Skin: Negative.  Negative for color change and pallor.  Allergic/Immunologic: Negative.   Neurological: Negative.  Negative for dizziness, weakness and light-headedness.  Hematological: Negative.  Negative for adenopathy. Does not bruise/bleed easily.  Psychiatric/Behavioral: Negative.     Objective:  BP 130/72 mmHg  Pulse 60  Temp(Src) 97.5 F (36.4 C) (Oral)  Resp 16  Ht 5\' 5"  (1.651 m)  Wt 177 lb (80.287 kg)  BMI 29.45 kg/m2  SpO2 98%  BP Readings from Last 3 Encounters:  04/21/15 130/72  04/21/15 130/86  04/14/15 113/65    Wt Readings from Last 3 Encounters:  04/21/15 177 lb (80.287 kg)  04/14/15 179 lb (81.194 kg)  04/12/15 179 lb (81.194 kg)    Physical Exam  Constitutional: She is oriented to person, place, and time. No distress.  HENT:  Mouth/Throat: Oropharynx is clear and moist. No oropharyngeal exudate.  Eyes: Conjunctivae are normal. Right eye exhibits no discharge. Left eye exhibits no discharge. No scleral icterus.  Neck: Normal range of motion. Neck supple. No JVD present. No tracheal deviation present. No thyromegaly present.  Cardiovascular: Normal rate, regular rhythm, normal heart sounds and intact distal pulses.  Exam reveals no gallop and no friction rub.   No murmur heard. Pulmonary/Chest: Effort normal and breath sounds normal. No stridor. No respiratory distress.  She has no wheezes. She has no rales. She exhibits no tenderness.  Abdominal: Soft. Bowel sounds are normal. She exhibits no distension and no mass. There is no tenderness. There is no rebound and no guarding.  Musculoskeletal: Normal range of motion. She exhibits no edema or tenderness.  Lymphadenopathy:    She has no cervical adenopathy.  Neurological: She is oriented to person, place, and time.  Skin: Skin is warm and dry. No rash noted. She is not diaphoretic. No erythema. No pallor.  Vitals reviewed.   Lab Results  Component Value Date   WBC 9.2 04/14/2015   HGB 12.6 04/14/2015   HCT 38.3 04/14/2015   PLT 296 04/14/2015   GLUCOSE 188* 04/14/2015   CHOL 144 01/27/2015   TRIG 124.0 01/27/2015   HDL 44.40 01/27/2015   LDLDIRECT 160.2 03/01/2010   LDLCALC 75 01/27/2015   ALT 10 01/27/2015   AST 12 01/27/2015   NA 140 04/14/2015   K 4.1 04/14/2015   CL 104 04/14/2015   CREATININE 0.96 04/14/2015   BUN 18 04/14/2015   CO2 27 04/14/2015   TSH 0.24* 01/27/2015   INR 1.05 07/16/2014   HGBA1C 8.5* 01/27/2015   MICROALBUR 30.9* 01/27/2015    No results found.  Assessment & Plan:   There are no diagnoses linked to this encounter. I have discontinued Ms. Halls's LIVALO. I am also having her maintain her aspirin EC, Insulin Lispro, Insulin Detemir, ibuprofen, nortriptyline, Canagliflozin-Metformin HCl, glyBURIDE, pantoprazole, clopidogrel, Pitavastatin Calcium, losartan-hydrochlorothiazide, magnesium oxide, ezetimibe, and gabapentin.  Meds ordered this encounter  Medications  . ezetimibe (ZETIA) 10 MG tablet    Sig: Take 1 tablet (10 mg total) by mouth daily.    Dispense:  90 tablet    Refill:  3  . gabapentin (NEURONTIN) 300 MG capsule    Sig:   . DISCONTD: LIVALO 2 MG TABS    Sig:      Follow-up: Return in about 2 months (around 06/22/2015).  Scarlette Calico, MD

## 2015-04-21 NOTE — Assessment & Plan Note (Signed)
Her blood sugars are adequately well controlled for her.

## 2015-04-21 NOTE — Progress Notes (Signed)
Theresa Harrington  Patient is a 59 year old who presented to the office today for preoperative examination and counseling. Patient had an annual exam with our nurse practitioner 03/04/2014 when she had been describing postmenopausal bleeding/spotting for several weeks. She had a sonohysterogram and endometrial biopsy on November 14 of this year which demonstrated the following:  Ultrasound/ sonohysterogram: Uterus measures 7.4 x 7.1 x 3.4 cm with endometrial stripe of 4.6 mm. Patient was 6 intramural myomas the largest one measuring 29 x 27 mm slightly indenting the endometrium. Right and left ovary atrophic. After the cervix was cleansed with Betadine solution normal saline was instilled into the uterine cavity an a posterior uterine polyp -like lesion was noted on the right side of the uterus measuring 10 x 3 x 6 mm.   The cervix was cleansed with Betadine solution once again and an endometrial biopsy was attempted with no tissue obtained. Patient to have medical clearance by her PCP Dr. Scarlette Calico which she scheduled to see today.  Patient scheduled next week for resectoscopic polypectomy. Pertinent Gynecological History: Menses: post-menopausal Bleeding: post menopausal bleeding Contraception: post menopausal status DES exposure: denies Blood transfusions: none Sexually transmitted diseases: no past history Previous GYN Procedures: DNC  Last mammogram: normal Date: 2016 Last pap: normal Date: 2016 OB History: G 1, P 0 A1   Menstrual History: Menarche age: 59 No LMP recorded. Patient is postmenopausal.    Past Medical History  Diagnosis Date  . PONV (postoperative nausea and vomiting)   . Right ureteral stone   . PAD (peripheral artery disease) ([place])   . Atherosclerosis of aorta ([place])   . GERD (gastroesophageal reflux disease)   . Asymptomatic cholelithiasis   . Peripheral arterial occlusive disease (HCC)     lower extremities  . Wears glasses   . Hypothyroidism     no meds    . Type 2 diabetes mellitus ([place])   . Anemia   . Anxiety   . Pneumonia   . Hypertension     Past Surgical History  Procedure Laterality Date  . Belpharoptosis repair      eyelid lift  . Cystoscopy with stent placement Right 06/04/2013    Procedure: CYSTOSCOPY WITH STENT PLACEMENT;  Surgeon: Franchot Gallo, MD;  Location: [facility];  Service: Urology;  Laterality: Right;  . Right knee patellectomy w/ repair of extensor mechanism  04-14-2002  . Orif fifth metacarpal fx  right hand  04-21-2002  . Laparoscopic gastric banding  05-29-2005  . Revision and re-siting lap-band port  04-08-2010    W/  UPPER EGD  . Combined augmentation mammaplasty and abdominoplasty  2009    W/  BILATERAL  THIGH LIFT  . Cysto/  right ureteral stent placement  12-30-2010  . Extracorporeal shock wave lithotripsy Right 08-04-2013//   06-23-2013//   01-16-2011  . Cystoscopy with retrograde pyelogram, ureteroscopy and stent placement Right 10/07/2013    Procedure: CYSTOSCOPY WITH RETROGRADE PYELOGRAM, right URETEROSCOPY AND STENT PLACEMENT, stone extraction;  Surgeon: Arvil Persons, MD;  [facility];  Service: Urology;  Laterality: Right;  . Holmium laser application Right [id number]    Procedure: HOLMIUM LASER APPLICATION;  Surgeon: Arvil Persons, MD;  Location: [facility];  Service: Urology;  Laterality: Right;  . Kidney stone surgery  April 2015    1-2 stones  . Toenail removed Left Jan. 21, 2016    2nd toenail-  Dr. Barkley Bruns  . Augmentation mammaplasty    . Lithotripsy  2-3  times  . Lower extremity angiogram N/A 06/04/2014    Procedure: LOWER EXTREMITY ANGIOGRAM;  Surgeon: Serafina Mitchell, MD;  Location: [facility] CATH LAB;  Service: Cardiovascular;  Laterality: N/A;  . Endarterectomy femoral Left 06/08/2014    Procedure: Left Leg Common Femoral and External Iliac  Endartarectomy with patch Angioplasty;  Surgeon: Rosetta Posner, MD;  Location: [place];  Service: Vascular;  Laterality: Left;  .  Femoral-popliteal bypass graft Left 06/08/2014    Procedure: Left Leg Femoral -Popliteal Bypass Graft;  Surgeon: Rosetta Posner, MD;  Location: [place];  Service: Vascular;  Laterality: Left;  . Femoral-popliteal bypass graft Left 06/09/2014    Procedure: Left Femoral and Popliteal Exposure; Left Femoral to Anterior Tibial Bypass Graft using Propaten 50mm by 80cm Goretex Graft; Left Tibial Endarterectomy; Left Femoraland Popliteal Thrombectomy ;  Surgeon: Serafina Mitchell, MD;  Location: [place];  Service: Vascular;  Laterality: Left;  . 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 Arthritis Other   . Cancer Other     colon  . Hypertension Other   . Stroke Other   . Diabetes Father   . Heart disease Father   . Deep vein thrombosis Father   . Hyperlipidemia Father   . Diabetes Sister   . Heart disease Sister   . Deep vein thrombosis Sister   . Hyperlipidemia Sister   . Diabetes Brother   . Heart disease Brother   . Cancer Brother   . Hyperlipidemia Brother   . Colon cancer Maternal Aunt   . Diabetes Brother   . Diabetes Brother   . Kidney disease Mother   . Hyperlipidemia Mother   . Other Mother     AAA   and    Amputation    Social History:  reports that she quit smoking about 13 years ago. Her smoking use included Cigarettes. She has a 28 pack-year smoking history. She has never used smokeless tobacco. She reports that she does not drink alcohol or use illicit drugs.  Allergies:  Allergies  Allergen Reactions  . Amlodipine Swelling  . Atorvastatin Other (See Comments)    Reaction:  Muscle aches   . Ampicillin Nausea And Vomiting and Other (See Comments)    Has patient had a PCN reaction causing immediate rash, facial/tongue/throat swelling, SOB or lightheadedness with hypotension: No Has patient had a  PCN reaction causing severe rash involving mucus membranes or skin necrosis: No Has patient had a PCN reaction that required hospitalization No Has patient had a PCN reaction occurring within the last 10 years: No If all of the above answers are "NO", then may proceed with Cephalosporin use.  . Codeine Nausea And Vomiting  . Lisinopril Cough       . Penicillins Nausea And Vomiting and Other (See Comments)    Has patient had a PCN reaction causing immediate rash, facial/tongue/throat swelling, SOB or lightheadedness with hypotension: No Has patient had a PCN reaction causing severe rash involving mucus membranes or skin necrosis: No Has patient had a PCN reaction that required hospitalization No Has patient had a PCN reaction occurring within the last 10 years: No If all of the above answers are "NO", then may proceed with Cephalosporin use.  [name] [Sitagliptin] Nausea And Vomiting     (  Not in a hospital admission)  REVIEW OF SYSTEMS: A ROS was performed and pertinent positives and negatives are included in the history.  GENERAL: No fevers or chills. HEENT: No change in vision, no earache, sore throat or sinus congestion. NECK: No pain or stiffness. CARDIOVASCULAR: No chest pain or pressure. No palpitations. PULMONARY: No shortness of breath, cough or wheeze. GASTROINTESTINAL: No abdominal pain, nausea, vomiting or diarrhea, melena or bright red blood per rectum. GENITOURINARY: No urinary frequency, urgency, hesitancy or dysuria. MUSCULOSKELETAL: No joint or muscle pain, no back pain, no recent trauma. DERMATOLOGIC: No rash, no itching, no lesions. ENDOCRINE: No polyuria, polydipsia, no heat or cold intolerance. No recent change in weight. HEMATOLOGICAL: No anemia or easy bruising or bleeding. NEUROLOGIC: No headache, seizures, numbness, tingling or weakness. PSYCHIATRIC: No depression, no loss of interest in normal activity or change in sleep pattern.     Blood pressure  130/86.  Physical Exam:  HEENT:unremarkable Neck:Supple, midline, no thyroid megaly, no carotid bruits Lungs:  Clear to auscultation no rhonchi's or wheezes Heart:Regular rate and rhythm, no murmurs or gallops Breast Exam: Not examined today Abdomen: Soft nontender no rebound or guarding Pelvic:BUS atrophic changes Vagina: Atrophic changes Cervix: Atrophic changes no lesions or discharge Uterus: Anteverted upper limits of normal nontender Adnexa: No palpable mass or tenderness Extremities: No cords, no edema Rectal: Not done  Assessment/Plan: Postmenopausal patient with postmenopausal bleeding sonohysterogram demonstrating endometrial polyp as well as uterine fibroid although fibroid not encroaching into the uterine cavity. Normal ovaries atrophic. No tissue obtained at time of attempted endometrial biopsy in the office. Patient will be scheduled for resectoscopic polypectomy next week. Wait for medical clearance from her PCP which she is seen today. Patient will receive prophylaxis antibiotic she has multiple allergies please see Epic.  Terrance Mass 04/21/2015, 2:55 PM

## 2015-04-21 NOTE — Patient Instructions (Signed)

## 2015-04-21 NOTE — Assessment & Plan Note (Signed)
Her blood pressure is well controlled. 

## 2015-04-21 NOTE — Progress Notes (Signed)
Pre visit review using our clinic review tool, if applicable. No additional management support is needed unless otherwise documented below in the visit note. 

## 2015-04-21 NOTE — Assessment & Plan Note (Signed)
She is scheduled for [facility] with gynecology in a week. There've been some concerns about her blood sugar in the operative and perioperative period. I would rather permit hyperglycemia than risk hypoglycemia. Her recent blood sugars have been well controlled. From a diabetic standpoint she is stable and ready for surgery.

## 2015-04-23 NOTE — Anesthesia Preprocedure Evaluation (Addendum)
Anesthesia Evaluation  Patient identified by MRN, date of birth, ID band Patient awake    Reviewed: Allergy & Precautions, NPO status , Patient's Chart, lab work & pertinent test results  History of Anesthesia Complications (+) PONV  Airway Mallampati: II  TM Distance: >3 FB Neck ROM: Full    Dental no notable dental hx. (+) Teeth Intact, Dental Advisory Given   Pulmonary neg pulmonary ROS, former smoker (quit 2003 28 pack year),    Pulmonary exam normal breath sounds clear to auscultation       Cardiovascular hypertension, Pt. on medications + Peripheral Vascular Disease  Normal cardiovascular exam Rhythm:Regular Rate:Normal  On Plavix and Asprin for PVD   Neuro/Psych Anxiety negative neurological ROS  negative psych ROS   GI/Hepatic negative GI ROS, Neg liver ROS, GERD  ,  Endo/Other  diabetes, Type 2, Insulin DependentHypothyroidism   Renal/GU negative Renal ROSstones  negative genitourinary   Musculoskeletal negative musculoskeletal ROS (+)   Abdominal (+)  Abdomen: soft.    Peds negative pediatric ROS (+)  Hematology  (+) anemia ,   Anesthesia Other Findings   Reproductive/Obstetrics negative OB ROS                          Anesthesia Physical  Anesthesia Plan  ASA: III  Anesthesia Plan: General   Post-op Pain Management:    Induction: Intravenous  Airway Management Planned: LMA  Additional Equipment:   Intra-op Plan:   Post-operative Plan: Extubation in OR  Informed Consent: I have reviewed the patients History and Physical, chart, labs and discussed the procedure including the risks, benefits and alternatives for the proposed anesthesia with the patient or authorized representative who has indicated his/her understanding and acceptance.   Dental advisory given  Plan Discussed with: CRNA and Surgeon  Anesthesia Plan Comments: (Has been off Plavix x 5 days)        Anesthesia Quick Evaluation

## 2015-04-27 ENCOUNTER — Ambulatory Visit (HOSPITAL_COMMUNITY): Payer: 59 | Admitting: Anesthesiology

## 2015-04-27 ENCOUNTER — Ambulatory Visit (HOSPITAL_COMMUNITY)
Admission: RE | Admit: 2015-04-27 | Discharge: 2015-04-27 | Disposition: A | Discharge status: Home / Self Care | Payer: 59 | Source: Ambulatory Visit | Attending: Gynecology | Admitting: Gynecology

## 2015-04-27 ENCOUNTER — Encounter (HOSPITAL_COMMUNITY)
Admission: RE | Disposition: A | Discharge status: Home / Self Care | Payer: Self-pay | Source: Ambulatory Visit | Attending: Gynecology

## 2015-04-27 DIAGNOSIS — Z87891 Personal history of nicotine dependence: Secondary | ICD-10-CM | POA: Diagnosis not present

## 2015-04-27 DIAGNOSIS — D259 Leiomyoma of uterus, unspecified: Secondary | ICD-10-CM | POA: Diagnosis not present

## 2015-04-27 DIAGNOSIS — Z880 Allergy status to penicillin: Secondary | ICD-10-CM | POA: Diagnosis not present

## 2015-04-27 DIAGNOSIS — N950 Postmenopausal bleeding: Secondary | ICD-10-CM | POA: Insufficient documentation

## 2015-04-27 DIAGNOSIS — E119 Type 2 diabetes mellitus without complications: Secondary | ICD-10-CM | POA: Insufficient documentation

## 2015-04-27 DIAGNOSIS — I10 Essential (primary) hypertension: Secondary | ICD-10-CM | POA: Insufficient documentation

## 2015-04-27 DIAGNOSIS — N840 Polyp of corpus uteri: Secondary | ICD-10-CM

## 2015-04-27 DIAGNOSIS — Z885 Allergy status to narcotic agent status: Secondary | ICD-10-CM | POA: Diagnosis not present

## 2015-04-27 DIAGNOSIS — K219 Gastro-esophageal reflux disease without esophagitis: Secondary | ICD-10-CM | POA: Diagnosis not present

## 2015-04-27 HISTORY — PX: DILATATION & CURETTAGE/HYSTEROSCOPY WITH MYOSURE: SHX6511

## 2015-04-27 LAB — GLUCOSE, CAPILLARY
GLUCOSE-CAPILLARY: 144 mg/dL — AB (ref 65–99)
Glucose-Capillary: 116 mg/dL — ABNORMAL HIGH (ref 65–99)

## 2015-04-27 SURGERY — DILATATION & CURETTAGE/HYSTEROSCOPY WITH MYOSURE
Anesthesia: General

## 2015-04-27 MED ORDER — MIDAZOLAM HCL 2 MG/2ML IJ SOLN
INTRAMUSCULAR | Status: AC
  Filled 2015-04-27: qty 2

## 2015-04-27 MED ORDER — ONDANSETRON HCL 4 MG/2ML IJ SOLN
INTRAMUSCULAR | Status: AC
  Filled 2015-04-27: qty 2

## 2015-04-27 MED ORDER — FENTANYL CITRATE (PF) 100 MCG/2ML IJ SOLN: 25.0000 ug | INTRAMUSCULAR | Status: DC | PRN

## 2015-04-27 MED ORDER — MEPERIDINE HCL 25 MG/ML IJ SOLN: 6.2500 mg | INTRAMUSCULAR | Status: DC | PRN

## 2015-04-27 MED ORDER — KETOROLAC TROMETHAMINE 30 MG/ML IJ SOLN
INTRAMUSCULAR | Status: AC
  Filled 2015-04-27: qty 1

## 2015-04-27 MED ORDER — LIDOCAINE HCL (CARDIAC) 20 MG/ML IV SOLN
INTRAVENOUS | Status: DC | PRN
  Administered 2015-04-27: 100 mg via INTRAVENOUS

## 2015-04-27 MED ORDER — VASOPRESSIN 20 UNIT/ML IV SOLN
INTRAVENOUS | Status: AC
  Filled 2015-04-27: qty 1

## 2015-04-27 MED ORDER — KETOROLAC TROMETHAMINE 30 MG/ML IJ SOLN
INTRAMUSCULAR | Status: DC | PRN
  Administered 2015-04-27: 30 mg via INTRAVENOUS

## 2015-04-27 MED ORDER — PROPOFOL 10 MG/ML IV BOLUS
INTRAVENOUS | Status: AC
  Filled 2015-04-27: qty 20

## 2015-04-27 MED ORDER — EPHEDRINE 5 MG/ML INJ
INTRAVENOUS | Status: AC
Start: 2015-04-27 — End: 2015-04-27
  Filled 2015-04-27: qty 10

## 2015-04-27 MED ORDER — EPHEDRINE SULFATE 50 MG/ML IJ SOLN
INTRAMUSCULAR | Status: DC | PRN
  Administered 2015-04-27 (×2): 10 mg via INTRAVENOUS

## 2015-04-27 MED ORDER — DEXAMETHASONE SODIUM PHOSPHATE 4 MG/ML IJ SOLN
INTRAMUSCULAR | Status: DC | PRN
  Administered 2015-04-27: 4 mg via INTRAVENOUS

## 2015-04-27 MED ORDER — MIDAZOLAM HCL 2 MG/2ML IJ SOLN
INTRAMUSCULAR | Status: DC | PRN
  Administered 2015-04-27 (×2): 1 mg via INTRAVENOUS

## 2015-04-27 MED ORDER — DEXTROSE 5 % IV SOLN
INTRAVENOUS | Status: DC
  Filled 2015-04-27: qty 10

## 2015-04-27 MED ORDER — ONDANSETRON HCL 4 MG/2ML IJ SOLN
INTRAMUSCULAR | Status: DC | PRN
  Administered 2015-04-27: 4 mg via INTRAVENOUS

## 2015-04-27 MED ORDER — FENTANYL CITRATE (PF) 100 MCG/2ML IJ SOLN
INTRAMUSCULAR | Status: AC
  Filled 2015-04-27: qty 2

## 2015-04-27 MED ORDER — SCOPOLAMINE 1 MG/3DAYS TD PT72
1.0000 | MEDICATED_PATCH | Freq: Once | TRANSDERMAL | Status: DC
Start: 2015-04-27 — End: 2015-04-27
  Administered 2015-04-27: 1.5 mg via TRANSDERMAL

## 2015-04-27 MED ORDER — FENTANYL CITRATE (PF) 100 MCG/2ML IJ SOLN
INTRAMUSCULAR | Status: DC | PRN
  Administered 2015-04-27: 50 ug via INTRAVENOUS
  Administered 2015-04-27: 25 ug via INTRAVENOUS

## 2015-04-27 MED ORDER — DEXAMETHASONE SODIUM PHOSPHATE 4 MG/ML IJ SOLN
INTRAMUSCULAR | Status: AC
Start: 2015-04-27 — End: 2015-04-27
  Filled 2015-04-27: qty 1

## 2015-04-27 MED ORDER — LIDOCAINE HCL (CARDIAC) 20 MG/ML IV SOLN
INTRAVENOUS | Status: AC
  Filled 2015-04-27: qty 5

## 2015-04-27 MED ORDER — LACTATED RINGERS IV SOLN
INTRAVENOUS | Status: DC
Start: 2015-04-27 — End: 2015-04-27
  Administered 2015-04-27 (×3): via INTRAVENOUS

## 2015-04-27 MED ORDER — SODIUM CHLORIDE 0.9 % IJ SOLN
INTRAMUSCULAR | Status: AC
  Filled 2015-04-27: qty 50

## 2015-04-27 MED ORDER — DEXTROSE 5 % IV SOLN
INTRAVENOUS | Status: AC
  Administered 2015-04-27: 114.25 mL via INTRAVENOUS
  Filled 2015-04-27: qty 8.25

## 2015-04-27 MED ORDER — PROPOFOL 10 MG/ML IV BOLUS
INTRAVENOUS | Status: DC | PRN
  Administered 2015-04-27: 200 mg via INTRAVENOUS

## 2015-04-27 MED ORDER — PROMETHAZINE HCL 25 MG/ML IJ SOLN: 6.2500 mg | INTRAMUSCULAR | Status: DC | PRN

## 2015-04-27 MED ORDER — SCOPOLAMINE 1 MG/3DAYS TD PT72
MEDICATED_PATCH | TRANSDERMAL | Status: AC
  Filled 2015-04-27: qty 1

## 2015-04-27 SURGICAL SUPPLY — 24 items
CANISTERS HI-FLOW 3000CC (CANNISTER) ×3 IMPLANT
CATH FOLEY 2WAY SLVR 30CC 16FR (CATHETERS) IMPLANT
CATH ROBINSON RED A/P 16FR (CATHETERS) ×3 IMPLANT
CLOTH BEACON ORANGE TIMEOUT ST (SAFETY) ×3 IMPLANT
CONTAINER PREFILL 10% NBF 60ML (FORM) ×6 IMPLANT
DEVICE MYOSURE LITE (MISCELLANEOUS) ×2 IMPLANT
DEVICE MYOSURE REACH (MISCELLANEOUS) IMPLANT
FILTER ARTHROSCOPY CONVERTOR (FILTER) ×3 IMPLANT
GLOVE BIOGEL PI IND STRL 7.0 (GLOVE) ×1 IMPLANT
GLOVE BIOGEL PI IND STRL 8 (GLOVE) ×1 IMPLANT
GLOVE BIOGEL PI INDICATOR 7.0 (GLOVE) ×2
GLOVE BIOGEL PI INDICATOR 8 (GLOVE) ×2
GLOVE ECLIPSE 7.5 STRL STRAW (GLOVE) ×6 IMPLANT
GOWN STRL REUS W/TWL LRG LVL3 (GOWN DISPOSABLE) ×6 IMPLANT
PACK VAGINAL MINOR WOMEN LF (CUSTOM PROCEDURE TRAY) ×3 IMPLANT
PAD OB MATERNITY 4.3X12.25 (PERSONAL CARE ITEMS) ×3 IMPLANT
PAD PREP 24X48 CUFFED NSTRL (MISCELLANEOUS) ×3 IMPLANT
PLUG CATH AND CAP STER (CATHETERS) IMPLANT
SEAL ROD LENS SCOPE MYOSURE (ABLATOR) ×3 IMPLANT
SYR 30ML LL (SYRINGE) IMPLANT
TOWEL OR 17X24 6PK STRL BLUE (TOWEL DISPOSABLE) ×6 IMPLANT
TUBING AQUILEX INFLOW (TUBING) ×3 IMPLANT
TUBING AQUILEX OUTFLOW (TUBING) ×3 IMPLANT
WATER STERILE IRR 1000ML POUR (IV SOLUTION) ×3 IMPLANT

## 2015-04-27 NOTE — Transfer of Care (Signed)
Immediate Anesthesia Transfer of Care Note  Patient: Theresa Harrington  Procedure(s) Performed: Procedure(s): DILATATION & CURETTAGE/HYSTEROSCOPY WITH MYOSURE (N/A)  Patient Location: PACU  Anesthesia Type:General  Level of Consciousness: awake, alert , oriented and patient cooperative  Airway & Oxygen Therapy: Patient Spontanous Breathing and Patient connected to nasal cannula oxygen  Post-op Assessment: Report given to RN and Post -op Vital signs reviewed and stable  Post vital signs: Reviewed and stable  Last Vitals:  TEMP 98.3 BP 113/66 HR 67 RR 15 POX 99  Complications: No apparent anesthesia complications

## 2015-04-27 NOTE — H&P (View-Only) (Signed)
Theresa Harrington  Patient is a 59 year old who presented to the office today for preoperative examination and counseling. Patient had an annual exam with our nurse practitioner 03/04/2014 when she had been describing postmenopausal bleeding/spotting for several weeks. She had a sonohysterogram and endometrial biopsy on November 14 of this year which demonstrated the following:  Ultrasound/ sonohysterogram: Uterus measures 7.4 x 7.1 x 3.4 cm with endometrial stripe of 4.6 mm. Patient was 6 intramural myomas the largest one measuring 29 x 27 mm slightly indenting the endometrium. Right and left ovary atrophic. After the cervix was cleansed with Betadine solution normal saline was instilled into the uterine cavity an a posterior uterine polyp -like lesion was noted on the right side of the uterus measuring 10 x 3 x 6 mm.   The cervix was cleansed with Betadine solution once again and an endometrial biopsy was attempted with no tissue obtained. Patient to have medical clearance by her PCP Dr. Scarlette Calico which she scheduled to see today.  Patient scheduled next week for resectoscopic polypectomy. Pertinent Gynecological History: Menses: post-menopausal Bleeding: post menopausal bleeding Contraception: post menopausal status DES exposure: denies Blood transfusions: none Sexually transmitted diseases: no past history Previous GYN Procedures: DNC  Last mammogram: normal Date: 2016 Last pap: normal Date: 2016 OB History: G 1, P 0 A1   Menstrual History: Menarche age: 43 No LMP recorded. Patient is postmenopausal.    Past Medical History  Diagnosis Date  . PONV (postoperative nausea and vomiting)   . Right ureteral stone   . PAD (peripheral artery disease) ([place])   . Atherosclerosis of aorta ([place])   . GERD (gastroesophageal reflux disease)   . Asymptomatic cholelithiasis   . Peripheral arterial occlusive disease (HCC)     lower extremities  . Wears glasses   . Hypothyroidism     no meds    . Type 2 diabetes mellitus ([place])   . Anemia   . Anxiety   . Pneumonia   . Hypertension     Past Surgical History  Procedure Laterality Date  . Belpharoptosis repair      eyelid lift  . Cystoscopy with stent placement Right 06/04/2013    Procedure: CYSTOSCOPY WITH STENT PLACEMENT;  Surgeon: Franchot Gallo, MD;  Location: [facility];  Service: Urology;  Laterality: Right;  . Right knee patellectomy w/ repair of extensor mechanism  04-14-2002  . Orif fifth metacarpal fx  right hand  04-21-2002  . Laparoscopic gastric banding  05-29-2005  . Revision and re-siting lap-band port  04-08-2010    W/  UPPER EGD  . Combined augmentation mammaplasty and abdominoplasty  2009    W/  BILATERAL  THIGH LIFT  . Cysto/  right ureteral stent placement  12-30-2010  . Extracorporeal shock wave lithotripsy Right 08-04-2013//   06-23-2013//   01-16-2011  . Cystoscopy with retrograde pyelogram, ureteroscopy and stent placement Right 10/07/2013    Procedure: CYSTOSCOPY WITH RETROGRADE PYELOGRAM, right URETEROSCOPY AND STENT PLACEMENT, stone extraction;  Surgeon: Arvil Persons, MD;  Location: [facility];  Service: Urology;  Laterality: Right;  . Holmium laser application Right [id number]    Procedure: HOLMIUM LASER APPLICATION;  Surgeon: Arvil Persons, MD;  Location: [facility];  Service: Urology;  Laterality: Right;  . Kidney stone surgery  April 2015    1-2 stones  . Toenail removed Left Jan. 21, 2016    2nd toenail-  Dr. Barkley Bruns  . Augmentation mammaplasty    . Lithotripsy  2-3  times  . Lower extremity angiogram N/A 06/04/2014    Procedure: LOWER EXTREMITY ANGIOGRAM;  Surgeon: Serafina Mitchell, MD;  Location: [facility];  Service: Cardiovascular;  Laterality: N/A;  . Endarterectomy femoral Left 06/08/2014    Procedure: Left Leg Common Femoral and External Iliac  Endartarectomy with patch Angioplasty;  Surgeon: Rosetta Posner, MD;  Location: [place];  Service: Vascular;  Laterality: Left;  .  Femoral-popliteal bypass graft Left 06/08/2014    Procedure: Left Leg Femoral -Popliteal Bypass Graft;  Surgeon: Rosetta Posner, MD;  Location: [place];  Service: Vascular;  Laterality: Left;  . Femoral-popliteal bypass graft Left 06/09/2014    Procedure: Left Femoral and Popliteal Exposure; Left Femoral to Anterior Tibial Bypass Graft using Propaten 46mm by 80cm Goretex Graft; Left Tibial Endarterectomy; Left Femoraland Popliteal Thrombectomy ;  Surgeon: Serafina Mitchell, MD;  Location: [place];  Service: Vascular;  Laterality: Left;  . Amputation Left 07/16/2014    Procedure: LEFT SECOND TOE AMPUTATION;  Surgeon: Serafina Mitchell, MD;  Location: [place];  Service: Vascular;  Laterality: Left;  With Nerve block    Family History  Problem Relation Age of Onset  . Arthritis Other   . Cancer Other     colon  . Hypertension Other   . Stroke Other   . Diabetes Father   . Heart disease Father   . Deep vein thrombosis Father   . Hyperlipidemia Father   . Diabetes Sister   . Heart disease Sister   . Deep vein thrombosis Sister   . Hyperlipidemia Sister   . Diabetes Brother   . Heart disease Brother   . Cancer Brother   . Hyperlipidemia Brother   . Colon cancer Maternal Aunt   . Diabetes Brother   . Diabetes Brother   . Kidney disease Mother   . Hyperlipidemia Mother   . Other Mother     AAA   and    Amputation    Social History:  reports that she quit smoking about 13 years ago. Her smoking use included Cigarettes. She has a 28 pack-year smoking history. She has never used smokeless tobacco. She reports that she does not drink alcohol or use illicit drugs.  Allergies:  Allergies  Allergen Reactions  . Amlodipine Swelling  . Atorvastatin Other (See Comments)    Reaction:  Muscle aches   . Ampicillin Nausea And Vomiting and Other (See Comments)    Has patient had a PCN reaction causing immediate rash, facial/tongue/throat swelling, SOB or lightheadedness with hypotension: No Has patient had a  PCN reaction causing severe rash involving mucus membranes or skin necrosis: No Has patient had a PCN reaction that required hospitalization No Has patient had a PCN reaction occurring within the last 10 years: No If all of the above answers are "NO", then may proceed with Cephalosporin use.  . Codeine Nausea And Vomiting  . Lisinopril Cough       . Penicillins Nausea And Vomiting and Other (See Comments)    Has patient had a PCN reaction causing immediate rash, facial/tongue/throat swelling, SOB or lightheadedness with hypotension: No Has patient had a PCN reaction causing severe rash involving mucus membranes or skin necrosis: No Has patient had a PCN reaction that required hospitalization No Has patient had a PCN reaction occurring within the last 10 years: No If all of the above answers are "NO", then may proceed with Cephalosporin use.  [name] [Sitagliptin] Nausea And Vomiting     (  Not in a hospital admission)  REVIEW OF SYSTEMS: A ROS was performed and pertinent positives and negatives are included in the history.  GENERAL: No fevers or chills. HEENT: No change in vision, no earache, sore throat or sinus congestion. NECK: No pain or stiffness. CARDIOVASCULAR: No chest pain or pressure. No palpitations. PULMONARY: No shortness of breath, cough or wheeze. GASTROINTESTINAL: No abdominal pain, nausea, vomiting or diarrhea, melena or bright red blood per rectum. GENITOURINARY: No urinary frequency, urgency, hesitancy or dysuria. MUSCULOSKELETAL: No joint or muscle pain, no back pain, no recent trauma. DERMATOLOGIC: No rash, no itching, no lesions. ENDOCRINE: No polyuria, polydipsia, no heat or cold intolerance. No recent change in weight. HEMATOLOGICAL: No anemia or easy bruising or bleeding. NEUROLOGIC: No headache, seizures, numbness, tingling or weakness. PSYCHIATRIC: No depression, no loss of interest in normal activity or change in sleep pattern.     Blood pressure  130/86.  Physical Exam:  HEENT:unremarkable Neck:Supple, midline, no thyroid megaly, no carotid bruits Lungs:  Clear to auscultation no rhonchi's or wheezes Heart:Regular rate and rhythm, no murmurs or gallops Breast Exam: Not examined today Abdomen: Soft nontender no rebound or guarding Pelvic:BUS atrophic changes Vagina: Atrophic changes Cervix: Atrophic changes no lesions or discharge Uterus: Anteverted upper limits of normal nontender Adnexa: No palpable mass or tenderness Extremities: No cords, no edema Rectal: Not done  Assessment/Plan: Postmenopausal patient with postmenopausal bleeding sonohysterogram demonstrating endometrial polyp as well as uterine fibroid although fibroid not encroaching into the uterine cavity. Normal ovaries atrophic. No tissue obtained at time of attempted endometrial biopsy in the office. Patient will be scheduled for resectoscopic polypectomy next week. Wait for medical clearance from her PCP which she is seen today. Patient will receive prophylaxis antibiotic she has multiple allergies please see Epic.  Terrance Mass 04/21/2015, 2:55 PM

## 2015-04-27 NOTE — Anesthesia Procedure Notes (Signed)
Procedure Name: LMA Insertion Date/Time: 04/27/2015 7:23 AM Performed by: Brock Ra Pre-anesthesia Checklist: Patient identified, Emergency Drugs available, Suction available, Patient being monitored and Timeout performed Patient Re-evaluated:Patient Re-evaluated prior to inductionOxygen Delivery Method: Circle system utilized Preoxygenation: Pre-oxygenation with 100% oxygen Intubation Type: IV induction Ventilation: Mask ventilation without difficulty LMA: LMA inserted LMA Size: 4.0 Grade View: Grade I Number of attempts: 1 Airway Equipment and Method: Patient positioned with wedge pillow Placement Confirmation: positive ETCO2,  CO2 detector and breath sounds checked- equal and bilateral Dental Injury: Teeth and Oropharynx as per pre-operative assessment

## 2015-04-27 NOTE — Op Note (Signed)
   Operative Note  04/27/2015  8:08 AM  PATIENT:  Theresa Harrington  60 y.o. female  PRE-OPERATIVE DIAGNOSIS:  post menopausal bleeding, endometrial polyp  POST-OPERATIVE DIAGNOSIS:  post menopausal bleeding, endometrial polyp  PROCEDURE:  Procedure(s): DILATATION & CURETTAGE/HYSTEROSCOPY WITH MYOSURE  SURGEON:  Surgeon(s): Terrance Mass, MD Anastasio Auerbach, MD  ANESTHESIA:   general  FINDINGS:small endometrial polyp right side of uterine cavity near lower uterine segment. Two white appearing areas that appeared to be submucus myoma posterior uterine wall. Both tubal ostial were seen. Smooth cervical canal.  Procedure: FINDINGS: The patient was taken to the operating room where she underwent a successful general endotracheal anesthesia. Patient had PAS stockings for DVT prophylaxis. She received Clindamycin and Gentamicin preop. Time out was undertaken to properly identify the patient and the proper operation schedule. The vagina and perineum were prepped and draped in usual sterile fashion. A red rubber Quentin Cornwall was inserted to empty the bladder is content for approximately 50 cc. Bimanual examination demonstrated an anteverted uterus. Patient's legs were in the high lithotomy position. A weighted speculum was placed in the posterior vaginal vault. A single-tooth tenaculum was placed on the anterior cervical lip. The uterus sounded to 7 cm. Pratt dilator were used to dilate the cervical canal to a  21 mm size. The Hologic Myosure resectoscopic morcellator with a scope of 6.25 mm and an operating blade of 3.0 mm was introduced into the intrauterine cavity. 0.9% normal saline was the distending media. Inspection of the endometrial cavity demonstrated:  small endometrial polyp right side of uterine cavity near lower uterine segment. Two white appearing areas that appeared to be submucus myoma posterior uterine wall. Both tubal ostial were seen. Smooth cervical canal.   With the resectoscopic  morcellator they were removed and submitted for histological evaluation. Fluid deficit was approximately 100 cc.The single-tooth tenaculum was removed patient was extubated transferred to recovery room stable vital signs blood loss was minimal. She received 30 mg of Toradol in route to the recovery room.     Intake/Output Summary (Last 24 hours) at 04/27/15 [id number] Last data filed at 04/27/15 0745  Gross per 24 hour  Intake      0 ml  Output    101 ml  Net   -101 ml     BLOOD ADMINISTERED:none   LOCAL MEDICATIONS USED:  NONE  SPECIMEN:  Source of Specimen:  Endometrial polyp  DISPOSITION OF SPECIMEN:  PATHOLOGY  COUNTS:  YES  PLAN OF CARE: Transfer to PACU  [facility] HMD8:08 AMTD@

## 2015-04-27 NOTE — Interval H&P Note (Signed)
History and Physical Interval Note:  04/27/2015 7:07 AM  Theresa Harrington  has presented today for surgery, with the diagnosis of post menopausal bleeding, endometrial polyp  The various methods of treatment have been discussed with the patient and family. After consideration of risks, benefits and other options for treatment, the patient has consented to  Procedure(s): Sullivan (N/A) as a surgical intervention .  The patient's history has been reviewed, patient examined, no change in status, stable for surgery.  I have reviewed the patient's chart and labs.  Questions were answered to the patient's satisfaction.     Terrance Mass

## 2015-04-27 NOTE — Anesthesia Postprocedure Evaluation (Signed)
Anesthesia Post Note  Patient: Theresa Harrington  Procedure(s) Performed: Procedure(s) (LRB): DILATATION & CURETTAGE/HYSTEROSCOPY WITH MYOSURE (N/A)  Patient location during evaluation: PACU Anesthesia Type: General Level of consciousness: awake and alert Pain management: pain level controlled Vital Signs Assessment: post-procedure vital signs reviewed and stable Respiratory status: spontaneous breathing, nonlabored ventilation, respiratory function stable and patient connected to nasal cannula oxygen Cardiovascular status: blood pressure returned to baseline and stable Postop Assessment: no signs of nausea or vomiting Anesthetic complications: no    Last Vitals:  Filed Vitals:   04/27/15 0601 04/27/15 0814  BP: 126/76 113/66  Pulse: 120 67  Temp: 36.4 C 36.8 C  Resp: 20 20    Last Pain: There were no vitals filed for this visit.               Haya Hemler

## 2015-04-27 NOTE — Discharge Instructions (Signed)

## 2015-04-28 ENCOUNTER — Encounter (HOSPITAL_COMMUNITY): Payer: Self-pay | Admitting: Gynecology

## 2015-05-06 ENCOUNTER — Telehealth: Payer: Self-pay

## 2015-05-06 NOTE — Telephone Encounter (Signed)
PA initiated via covermymeds. Key for PA is [id number]

## 2015-05-11 ENCOUNTER — Encounter: Payer: Self-pay | Admitting: Gynecology

## 2015-05-11 ENCOUNTER — Ambulatory Visit: Payer: 59 | Admitting: Gynecology

## 2015-05-11 ENCOUNTER — Ambulatory Visit (INDEPENDENT_AMBULATORY_CARE_PROVIDER_SITE_OTHER): Payer: BLUE CROSS/BLUE SHIELD | Admitting: Gynecology

## 2015-05-11 VITALS — BP 120/80

## 2015-05-11 DIAGNOSIS — Z09 Encounter for follow-up examination after completed treatment for conditions other than malignant neoplasm: Secondary | ICD-10-CM

## 2015-05-11 NOTE — Progress Notes (Signed)
   Patient is a 60 year old who presented to the office for her 3 week postop visit. On 04/27/2015 patient underwent resectoscopic polypectomy as a result of postmenopausal bleeding and endometrial polyp. Patient is asymptomatic today and doing well no reported vaginal bleeding. Findings from her surgery as well as pictures and pathology report were shared with her as follows:  FINDINGS:small endometrial polyp right side of uterine cavity near lower uterine segment. Two white appearing areas that appeared to be submucus myoma posterior uterine wall. Both tubal ostial were seen. Smooth cervical canal.  Pathology report: Diagnosis Polyp, uterine - BENIGN ENDOMETRIAL POLYP. - NO HYPERPLASIA OR CARCINOMA.  Exam: Abdomen: Soft nontender no rebound or guarding Pelvic: Bartholin urethra with atrophic changes Vagina: No lesions or discharge Cervix: No lesions or discharge Bimanual exam: Uterus anteverted upper limits of normal no palpable masses or tenderness Adnexa: No palpable masses or tenderness Rectal exam: Not done  Assessment/plan: Patient 3 weeks status post resectoscopic polypectomy on this postmenopausal patient who had postmenopausal bleeding. Pathology report demonstrated benign endometrial polyp. Patient asymptomatic with no further vaginal bleeding reported. Patient will be referred back to her PCP Theresa Harrington for her medical care.

## 2015-05-14 ENCOUNTER — Ambulatory Visit: Payer: 59 | Admitting: Gynecology

## 2015-05-17 ENCOUNTER — Other Ambulatory Visit: Payer: Self-pay

## 2015-05-17 DIAGNOSIS — E1151 Type 2 diabetes mellitus with diabetic peripheral angiopathy without gangrene: Secondary | ICD-10-CM

## 2015-05-21 ENCOUNTER — Other Ambulatory Visit: Payer: Self-pay

## 2015-05-21 DIAGNOSIS — E1151 Type 2 diabetes mellitus with diabetic peripheral angiopathy without gangrene: Secondary | ICD-10-CM

## 2015-05-21 MED ORDER — INSULIN DETEMIR 100 UNIT/ML FLEXPEN: 60.0000 [IU] | PEN_INJECTOR | Freq: Every day | SUBCUTANEOUS | Status: DC

## 2015-05-24 ENCOUNTER — Ambulatory Visit (INDEPENDENT_AMBULATORY_CARE_PROVIDER_SITE_OTHER): Payer: BLUE CROSS/BLUE SHIELD

## 2015-05-24 ENCOUNTER — Telehealth: Payer: Self-pay

## 2015-05-24 ENCOUNTER — Other Ambulatory Visit (HOSPITAL_COMMUNITY): Payer: 59

## 2015-05-24 ENCOUNTER — Telehealth: Payer: Self-pay | Admitting: Internal Medicine

## 2015-05-24 ENCOUNTER — Other Ambulatory Visit: Payer: Self-pay

## 2015-05-24 ENCOUNTER — Ambulatory Visit: Payer: 59 | Admitting: Family

## 2015-05-24 ENCOUNTER — Encounter (HOSPITAL_COMMUNITY): Payer: 59

## 2015-05-24 DIAGNOSIS — E538 Deficiency of other specified B group vitamins: Secondary | ICD-10-CM | POA: Diagnosis not present

## 2015-05-24 DIAGNOSIS — E1151 Type 2 diabetes mellitus with diabetic peripheral angiopathy without gangrene: Secondary | ICD-10-CM

## 2015-05-24 DIAGNOSIS — G63 Polyneuropathy in diseases classified elsewhere: Principal | ICD-10-CM

## 2015-05-24 MED ORDER — CYANOCOBALAMIN 1000 MCG/ML IJ SOLN
1000.0000 ug | Freq: Once | INTRAMUSCULAR | Status: AC
  Administered 2015-05-24: 1000 ug via INTRAMUSCULAR

## 2015-05-24 MED ORDER — INSULIN DETEMIR 100 UNIT/ML FLEXPEN: 60.0000 [IU] | PEN_INJECTOR | Freq: Every day | SUBCUTANEOUS | Status: DC

## 2015-05-24 NOTE — Telephone Encounter (Signed)
Pt assistance program mailed and faxed. Per pt request added that her hands tremble. Therefore if possible she could receive pens instead of vial

## 2015-05-24 NOTE — Telephone Encounter (Signed)
Handled during nurse visit today

## 2015-05-24 NOTE — Telephone Encounter (Signed)
Patient would like to know if you received fax from [facility] for levemir.  States that if this can be filled out then patient can get her script.  Patient does not use [facility] for this medication.

## 2015-05-31 ENCOUNTER — Encounter: Payer: Self-pay | Admitting: Family

## 2015-05-31 ENCOUNTER — Telehealth: Payer: Self-pay

## 2015-05-31 NOTE — Telephone Encounter (Signed)
Reported pt's symptoms to Dr. Trula Slade.  Advised to bring pt. in today or next week on 2/6 for right root check;  No ABI's will be needed for this appt.  Notified pt. Of possibility to come to office today.  Stated she would prefer to come in next week.  Will have an office scheduler contact pt. with appt. Info.  Pt. agrees with plan.

## 2015-05-31 NOTE — Telephone Encounter (Signed)
Phone call from pt. Reported she saw the Podiatrist last week, and was noted to have a small cut on the right small toe.  Stated there had been some redness, but "it looks better today."  Denied any drainage.  Reported the podiatrist recommended to soak in Epson Salts and apply Betadine solution and cover with dry gauze dressing.  Advised by Podiatrist to schedule appt. with Dr. Trula Slade, and have the foot checked.

## 2015-06-02 ENCOUNTER — Ambulatory Visit: Payer: BLUE CROSS/BLUE SHIELD | Admitting: Internal Medicine

## 2015-06-03 DIAGNOSIS — E1151 Type 2 diabetes mellitus with diabetic peripheral angiopathy without gangrene: Secondary | ICD-10-CM | POA: Diagnosis not present

## 2015-06-03 DIAGNOSIS — L97512 Non-pressure chronic ulcer of other part of right foot with fat layer exposed: Secondary | ICD-10-CM | POA: Diagnosis not present

## 2015-06-04 ENCOUNTER — Encounter: Payer: Self-pay | Admitting: Surgery

## 2015-06-07 ENCOUNTER — Ambulatory Visit: Payer: BLUE CROSS/BLUE SHIELD | Admitting: Family

## 2015-06-08 ENCOUNTER — Ambulatory Visit: Payer: BLUE CROSS/BLUE SHIELD | Admitting: Internal Medicine

## 2015-06-08 ENCOUNTER — Telehealth: Payer: Self-pay

## 2015-06-08 NOTE — Telephone Encounter (Signed)
Calling pt to inform pt assistance came in (Levemir Pen) it is located in fridge in Cindy;s office.

## 2015-06-11 ENCOUNTER — Encounter: Payer: Self-pay | Admitting: Surgery

## 2015-06-11 ENCOUNTER — Ambulatory Visit (INDEPENDENT_AMBULATORY_CARE_PROVIDER_SITE_OTHER): Payer: PPO | Admitting: Surgery

## 2015-06-11 VITALS — BP 153/67 | HR 64 | Temp 97.0°F | Resp 16 | Ht 65.0 in | Wt 177.0 lb

## 2015-06-11 DIAGNOSIS — I739 Peripheral vascular disease, unspecified: Secondary | ICD-10-CM | POA: Diagnosis not present

## 2015-06-11 DIAGNOSIS — I70219 Atherosclerosis of native arteries of extremities with intermittent claudication, unspecified extremity: Secondary | ICD-10-CM

## 2015-06-11 NOTE — Progress Notes (Signed)
Patient name: Theresa Harrington MRN: [id number] DOB: Apr 05, 1956 Sex: female     Chief Complaint  Patient presents with  . PAD    f/u right toe    HISTORY OF PRESENT ILLNESS: This is a 60 y.o. female who is initially underwent left femoral-popliteal bypass graft by Dr. early on 02/06/2015. Unfortunately this occluded early. I took her back to the operating room on 06/09/2014 and redid her operation with Gore-Tex, 6 mm propatent. She was ultimately discharged from the hospital on IV antibiotics. She then began hyperbaric oxygen treatment. She ultimately developed necrotic changes to the left second toe. 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 Doppler studies in December 2016 reveal I passed graft occlusion. Her ABI was 0.68.   The patient comes in today with a wound on her right fifth toe.  Fortunately this has healed.  She does not have any open wounds on her left leg.  Her claudication symptoms are stable.   Past Medical History  Diagnosis Date  . PONV (postoperative nausea and vomiting)   . Right ureteral stone   . PAD (peripheral artery disease) ([place])   . Atherosclerosis of aorta ([place])   . GERD (gastroesophageal reflux disease)   . Asymptomatic cholelithiasis   . Peripheral arterial occlusive disease ([facility])     lower extremities  . Wears glasses   . Hypothyroidism     no meds  . Type 2 diabetes mellitus ([place])   . Anemia   . Anxiety   . Pneumonia   . Hypertension     Past Surgical History  Procedure Laterality Date  . Belpharoptosis repair      eyelid lift  . Cystoscopy with stent placement Right 06/04/2013    Procedure: CYSTOSCOPY WITH STENT PLACEMENT;  Surgeon: Franchot Gallo, MD;  Location: [facility];  Service: Urology;  Laterality: Right;  . Right knee patellectomy w/ repair of extensor mechanism  04-14-2002  . Orif fifth metacarpal fx  right hand  04-21-2002  . Laparoscopic gastric banding  05-29-2005  . Revision and re-siting lap-band port   04-08-2010    W/  UPPER EGD  . Combined augmentation mammaplasty and abdominoplasty  2009    W/  BILATERAL  THIGH LIFT  . Cysto/  right ureteral stent placement  12-30-2010  . Extracorporeal shock wave lithotripsy Right 08-04-2013//   06-23-2013//   01-16-2011  . Cystoscopy with retrograde pyelogram, ureteroscopy and stent placement Right 10/07/2013    Procedure: CYSTOSCOPY WITH RETROGRADE PYELOGRAM, right URETEROSCOPY AND STENT PLACEMENT, stone extraction;  Surgeon: Arvil Persons, MD;  Location: [facility];  Service: Urology;  Laterality: Right;  . Holmium laser application Right [id number]    Procedure: HOLMIUM LASER APPLICATION;  Surgeon: Arvil Persons, MD;  Location: [facility];  Service: Urology;  Laterality: Right;  . Kidney stone surgery  April 2015    1-2 stones  . Toenail removed Left Jan. 21, 2016    2nd toenail-  Dr. Barkley Bruns  . Augmentation mammaplasty    . Lithotripsy  2-3 times  . Lower extremity angiogram N/A 06/04/2014    Procedure: LOWER EXTREMITY ANGIOGRAM;  Surgeon: Serafina Mitchell, MD;  [facility] C[facility] LAB;  Service: Cardiovascular;  Laterality: N/A;  . Endarterectomy femoral Left 06/08/2014    Procedure: Left Leg Common Femoral and External Iliac  Endartarectomy with patch Angioplasty;  Surgeon: Rosetta Posner, MD;  Location: [place];  Service: Vascular;  Laterality: Left;  . Femoral-popliteal bypass graft  Left 06/08/2014    Procedure: Left Leg Femoral -Popliteal Bypass Graft;  Surgeon: Rosetta Posner, MD;  Location: [place];  Service: Vascular;  Laterality: Left;  . Femoral-popliteal bypass graft Left 06/09/2014    Procedure: Left Femoral and Popliteal Exposure; Left Femoral to Anterior Tibial Bypass Graft using Propaten 36mm by 80cm Goretex Graft; Left Tibial Endarterectomy; Left Femoraland Popliteal Thrombectomy ;  Surgeon: Serafina Mitchell, MD;  Location: [place];  Service: Vascular;  Laterality: Left;  . Amputation Left 07/16/2014    Procedure: LEFT SECOND TOE  AMPUTATION;  Surgeon: Serafina Mitchell, MD;  Location: [place];  Service: Vascular;  Laterality: Left;  With Nerve block  . Dilatation & curettage/hysteroscopy with myosure N/A 04/27/2015    Procedure: DILATATION & CURETTAGE/HYSTEROSCOPY WITH MYOSURE;  Surgeon: Terrance Mass, MD;  Location: [place] [facility];  Service: Gynecology;  Laterality: N/A;    Social History   Social History  . Marital Status: Single    Spouse Name: N/A  . Number of Children: N/A  . Years of Education: N/A   Occupational History  . tester    Social History Main Topics  . Smoking status: Former Smoker -- 1.00 packs/day for 28 years    Types: Cigarettes    Quit date: 09/05/2001  . Smokeless tobacco: Never Used  . Alcohol Use: No  . Drug Use: No  . Sexual Activity: Yes   Other Topics Concern  . Not on file   Social History Narrative   Regular exercise- yes    Family History  Problem Relation Age of Onset  . Arthritis Other   . Cancer Other     colon  . Hypertension Other   . Stroke Other   . Diabetes Father   . Heart disease Father   . Deep vein thrombosis Father   . Hyperlipidemia Father   . Diabetes Sister   . Heart disease Sister   . Deep vein thrombosis Sister   . Hyperlipidemia Sister   . Diabetes Brother   . Heart disease Brother   . Cancer Brother   . Hyperlipidemia Brother   . Colon cancer Maternal Aunt   . Diabetes Brother   . Diabetes Brother   . Kidney disease Mother   . Hyperlipidemia Mother   . Other Mother     AAA   and    Amputation    Allergies as of 06/11/2015 - Review Complete 06/11/2015  Allergen Reaction Noted  . Amlodipine Swelling 11/26/2013  . Atorvastatin Other (See Comments) 10/28/2013  . Ampicillin Nausea And Vomiting and Other (See Comments) 04/12/2011  . Codeine Nausea And Vomiting   . Lisinopril Cough 10/05/2008  . Penicillins Nausea And Vomiting and Other (See Comments) 05/01/2011  . Januvia [sitagliptin] Nausea And Vomiting 12/21/2011    Current  Outpatient Prescriptions on File Prior to Visit  Medication Sig Dispense Refill  . aspirin EC 325 MG tablet Take 325 mg by mouth daily.    . Canagliflozin-Metformin HCl [id number] MG TABS Take 1 tablet by mouth 2 (two) times daily. 180 tablet 1  . clopidogrel (PLAVIX) 75 MG tablet Take 1 tablet (75 mg total) by mouth daily. 90 tablet 3  . ezetimibe (ZETIA) 10 MG tablet Take 1 tablet (10 mg total) by mouth daily. 90 tablet 3  . gabapentin (NEURONTIN) 300 MG capsule     . glyBURIDE (DIABETA) 5 MG tablet Take 1 tablet (5 mg total) by mouth daily with breakfast. 90 tablet 1  . Insulin Detemir (LEVEMIR  FLEXTOUCH) 100 UNIT/ML Pen Inject 60 Units into the skin daily at 10 pm. DX 250.72 30 mL 2  . Insulin Lispro, Human, (HUMALOG KWIKPEN) 200 UNIT/ML SOPN Inject 5 Units into the skin 3 (three) times daily with meals as needed. (Patient taking differently: Inject 5 Units into the skin 3 (three) times daily with meals as needed (for high blood sugar). ) 3 mL 11  . losartan-hydrochlorothiazide (HYZAAR) 100-12.5 MG tablet Take 1 tablet by mouth daily.    . magnesium oxide (MAG-OX) 400 MG tablet Take 400 mg by mouth daily.    . nortriptyline (PAMELOR) 10 MG capsule Take 1 capsule (10 mg total) by mouth at bedtime. 90 capsule 5  . pantoprazole (PROTONIX) 40 MG tablet Take 1 tablet (40 mg total) by mouth daily. 90 tablet 3  . Pitavastatin Calcium (LIVALO) 2 MG TABS Take 2 mg by mouth daily.    [name] ibuprofen (ADVIL,MOTRIN) 600 MG tablet Take 1 tablet (600 mg total) by mouth every 8 (eight) hours as needed. (Patient not taking: Reported on 05/11/2015) 30 tablet 0   No current facility-administered medications on file prior to visit.     REVIEW OF SYSTEMS:  refer to history of present illness. All other review of systems are negative.  PHYSICAL EXAMINATION:   Vital signs are  Filed Vitals:   06/11/15 0909 06/11/15 0913  BP: 153/69 153/67  Pulse: 61 64  Temp: 97 F (36.1 C)   Resp: 16   Height: 5\' 5"  (1.651  m)   Weight: 177 lb (80.287 kg)   SpO2: 100%    Body mass index is 29.45 kg/(m^2). General: The patient appears their stated age. HEENT:  No gross abnormalities Pulmonary:  Non labored breathing Musculoskeletal: There are no major deformities. Neurologic: No focal weakness or paresthesias are detected, Skin:  There are no open wounds on either foot.  The area of concern on the right fifth toe has healed Psychiatric: The patient has normal affect. Cardiovascular: There is a regular rate and rhythm    Diagnostic Studies  none  Assessment:  atherosclerotic peripheral vascular disease with claudication Plan:  the patient has a known silent occlusion of her left femoral-popliteal bypass graft. She has no wounds on either extremity.  Her last angiogram shows right superficial femoral and popliteal occlusion.  She is managing her claudication symptoms.  We discussed the importance of exercise program and discussed how to perform this. She will contact me if she develops a wound.  Otherwise, she will follow up with me again in December of this year , as previously scheduled.  Eldridge Abrahams, M.D. [facility] Office: [phone] Pager:  [phone]

## 2015-06-17 ENCOUNTER — Ambulatory Visit: Payer: 59 | Admitting: Dietician

## 2015-06-17 DIAGNOSIS — E1151 Type 2 diabetes mellitus with diabetic peripheral angiopathy without gangrene: Secondary | ICD-10-CM | POA: Diagnosis not present

## 2015-06-17 DIAGNOSIS — L97511 Non-pressure chronic ulcer of other part of right foot limited to breakdown of skin: Secondary | ICD-10-CM | POA: Diagnosis not present

## 2015-06-17 DIAGNOSIS — I779 Disorder of arteries and arterioles, unspecified: Secondary | ICD-10-CM | POA: Diagnosis not present

## 2015-06-18 NOTE — Telephone Encounter (Signed)
Pt informed again samples will be in [facility] fridge

## 2015-06-21 DIAGNOSIS — Z4651 Encounter for fitting and adjustment of gastric lap band: Secondary | ICD-10-CM | POA: Diagnosis not present

## 2015-06-23 DIAGNOSIS — R3129 Other microscopic hematuria: Secondary | ICD-10-CM | POA: Diagnosis not present

## 2015-06-23 DIAGNOSIS — Z0000 Encounter for general adult medical examination without abnormal findings: Secondary | ICD-10-CM | POA: Diagnosis not present

## 2015-06-23 DIAGNOSIS — N390 Urinary tract infection, site not specified: Secondary | ICD-10-CM | POA: Diagnosis not present

## 2015-06-23 DIAGNOSIS — R350 Frequency of micturition: Secondary | ICD-10-CM | POA: Diagnosis not present

## 2015-06-24 DIAGNOSIS — Z9884 Bariatric surgery status: Secondary | ICD-10-CM | POA: Diagnosis not present

## 2015-06-29 ENCOUNTER — Ambulatory Visit: Payer: PPO | Admitting: Internal Medicine

## 2015-07-01 ENCOUNTER — Other Ambulatory Visit: Payer: Self-pay | Admitting: Internal Medicine

## 2015-07-29 ENCOUNTER — Ambulatory Visit: Payer: PPO

## 2015-07-30 ENCOUNTER — Ambulatory Visit (INDEPENDENT_AMBULATORY_CARE_PROVIDER_SITE_OTHER): Payer: PPO

## 2015-07-30 DIAGNOSIS — E538 Deficiency of other specified B group vitamins: Secondary | ICD-10-CM

## 2015-07-30 DIAGNOSIS — G63 Polyneuropathy in diseases classified elsewhere: Principal | ICD-10-CM

## 2015-07-30 MED ORDER — CYANOCOBALAMIN 1000 MCG/ML IJ SOLN
1000.0000 ug | Freq: Once | INTRAMUSCULAR | Status: AC
  Administered 2015-07-30: 1000 ug via INTRAMUSCULAR

## 2015-08-03 DIAGNOSIS — E1140 Type 2 diabetes mellitus with diabetic neuropathy, unspecified: Secondary | ICD-10-CM | POA: Diagnosis not present

## 2015-08-03 DIAGNOSIS — E1151 Type 2 diabetes mellitus with diabetic peripheral angiopathy without gangrene: Secondary | ICD-10-CM | POA: Diagnosis not present

## 2015-08-04 ENCOUNTER — Encounter: Payer: Self-pay | Admitting: Internal Medicine

## 2015-08-04 ENCOUNTER — Telehealth: Payer: Self-pay | Admitting: Internal Medicine

## 2015-08-04 ENCOUNTER — Other Ambulatory Visit (INDEPENDENT_AMBULATORY_CARE_PROVIDER_SITE_OTHER): Payer: PPO

## 2015-08-04 ENCOUNTER — Ambulatory Visit (INDEPENDENT_AMBULATORY_CARE_PROVIDER_SITE_OTHER): Payer: PPO | Admitting: Internal Medicine

## 2015-08-04 VITALS — BP 110/60 | HR 69 | Temp 97.6°F | Resp 16 | Ht 65.0 in | Wt 179.0 lb

## 2015-08-04 DIAGNOSIS — I10 Essential (primary) hypertension: Secondary | ICD-10-CM

## 2015-08-04 DIAGNOSIS — E1151 Type 2 diabetes mellitus with diabetic peripheral angiopathy without gangrene: Secondary | ICD-10-CM | POA: Diagnosis not present

## 2015-08-04 DIAGNOSIS — R300 Dysuria: Secondary | ICD-10-CM | POA: Insufficient documentation

## 2015-08-04 DIAGNOSIS — D509 Iron deficiency anemia, unspecified: Secondary | ICD-10-CM

## 2015-08-04 DIAGNOSIS — E538 Deficiency of other specified B group vitamins: Secondary | ICD-10-CM

## 2015-08-04 DIAGNOSIS — I739 Peripheral vascular disease, unspecified: Secondary | ICD-10-CM

## 2015-08-04 DIAGNOSIS — E042 Nontoxic multinodular goiter: Secondary | ICD-10-CM | POA: Diagnosis not present

## 2015-08-04 DIAGNOSIS — G63 Polyneuropathy in diseases classified elsewhere: Secondary | ICD-10-CM

## 2015-08-04 DIAGNOSIS — I7025 Atherosclerosis of native arteries of other extremities with ulceration: Secondary | ICD-10-CM

## 2015-08-04 DIAGNOSIS — N390 Urinary tract infection, site not specified: Secondary | ICD-10-CM

## 2015-08-04 LAB — URINALYSIS, ROUTINE W REFLEX MICROSCOPIC
KETONES UR: NEGATIVE
NITRITE: NEGATIVE
Specific Gravity, Urine: 1.025 (ref 1.000–1.030)
TOTAL PROTEIN, URINE-UPE24: 100 — AB
UROBILINOGEN UA: 0.2 (ref 0.0–1.0)
pH: 6 (ref 5.0–8.0)

## 2015-08-04 LAB — CBC WITH DIFFERENTIAL/PLATELET
BASOS ABS: 0.1 10*3/uL (ref 0.0–0.1)
Basophils Relative: 0.5 % (ref 0.0–3.0)
EOS ABS: 0.2 10*3/uL (ref 0.0–0.7)
Eosinophils Relative: 1.5 % (ref 0.0–5.0)
HCT: 43 % (ref 36.0–46.0)
HEMOGLOBIN: 14 g/dL (ref 12.0–15.0)
Lymphocytes Relative: 21.4 % (ref 12.0–46.0)
Lymphs Abs: 2.6 10*3/uL (ref 0.7–4.0)
MCHC: 32.5 g/dL (ref 30.0–36.0)
MCV: 84.8 fl (ref 78.0–100.0)
Monocytes Absolute: 0.5 10*3/uL (ref 0.1–1.0)
Monocytes Relative: 4.3 % (ref 3.0–12.0)
Neutro Abs: 8.9 10*3/uL — ABNORMAL HIGH (ref 1.4–7.7)
Neutrophils Relative %: 72.3 % (ref 43.0–77.0)
Platelets: 298 10*3/uL (ref 150.0–400.0)
RBC: 5.07 Mil/uL (ref 3.87–5.11)
RDW: 16.1 % — ABNORMAL HIGH (ref 11.5–15.5)
WBC: 12.3 10*3/uL — AB (ref 4.0–10.5)

## 2015-08-04 LAB — BASIC METABOLIC PANEL
BUN: 21 mg/dL (ref 6–23)
CALCIUM: 10.2 mg/dL (ref 8.4–10.5)
CO2: 28 mEq/L (ref 19–32)
CREATININE: 1.12 mg/dL (ref 0.40–1.20)
Chloride: 100 mEq/L (ref 96–112)
GFR: 64.16 mL/min (ref >60.00)
Glucose, Bld: 172 mg/dL — ABNORMAL HIGH (ref 70–99)
Potassium: 4.3 mEq/L (ref 3.5–5.1)
Sodium: 137 mEq/L (ref 135–145)

## 2015-08-04 LAB — T4: T4 TOTAL: 9.7 ug/dL (ref 4.5–12.0)

## 2015-08-04 LAB — TSH: TSH: 0.25 u[IU]/mL — ABNORMAL LOW (ref 0.35–4.50)

## 2015-08-04 LAB — HEMOGLOBIN A1C: HEMOGLOBIN A1C: 9.7 % — AB (ref 4.6–6.5)

## 2015-08-04 MED ORDER — NALTREXONE-BUPROPION HCL ER 8-90 MG PO TB12: 2.0000 | ORAL_TABLET | Freq: Two times a day (BID) | ORAL | Status: DC

## 2015-08-04 MED ORDER — PRAVASTATIN SODIUM 40 MG PO TABS: 40.0000 mg | ORAL_TABLET | Freq: Every day | ORAL | Status: DC

## 2015-08-04 MED ORDER — SULFAMETHOXAZOLE-TRIMETHOPRIM 800-160 MG PO TABS: 1.0000 | ORAL_TABLET | Freq: Two times a day (BID) | ORAL | Status: AC

## 2015-08-04 MED ORDER — INSULIN LISPRO 200 UNIT/ML ~~LOC~~ SOPN: 10.0000 [IU] | PEN_INJECTOR | Freq: Three times a day (TID) | SUBCUTANEOUS | Status: DC | PRN

## 2015-08-04 MED ORDER — INSULIN DETEMIR 100 UNIT/ML FLEXPEN: 80.0000 [IU] | PEN_INJECTOR | Freq: Every day | SUBCUTANEOUS | Status: DC

## 2015-08-04 MED ORDER — METFORMIN HCL 1000 MG PO TABS: 1000.0000 mg | ORAL_TABLET | Freq: Two times a day (BID) | ORAL | Status: DC

## 2015-08-04 NOTE — Progress Notes (Signed)
Pre visit review using our clinic review tool, if applicable. No additional management support is needed unless otherwise documented below in the visit note. 

## 2015-08-04 NOTE — Telephone Encounter (Signed)
Pt give lab results also called pharmacy. Per PCP pt told to stop uribel while on atx.

## 2015-08-04 NOTE — Progress Notes (Signed)
Subjective:  Patient ID: Theresa Harrington, female    DOB: [birth year]  Age: 60 y.o. MRN: [id number]  CC: Diabetes; Hypertension; Hyperlipidemia; and Urinary Tract Infection   HPI Theresa Harrington presents for follow-up.  She complains of weight gain and wants to try an oral medication to help her lose weight and decrease her caloric intake. She also complains of dysuria for about a month. She denies hematuria, flank pain, abdominal pain, nausea, vomiting, fever, or chills. She also complains of recurrent cholesterol medicine is too expensive and she wants to try less expensive generic. She is not having any side effects such as myalgias or arthralgias.  Outpatient Prescriptions Prior to Visit  Medication Sig Dispense Refill  . aspirin EC 325 MG tablet Take 325 mg by mouth daily.    . clopidogrel (PLAVIX) 75 MG tablet Take 1 tablet (75 mg total) by mouth daily. 90 tablet 3  . ezetimibe (ZETIA) 10 MG tablet Take 1 tablet (10 mg total) by mouth daily. 90 tablet 3  . gabapentin (NEURONTIN) 300 MG capsule     . glyBURIDE (DIABETA) 5 MG tablet TAKE ONE TABLET BY MOUTH ONCE DAILY WITH BREAKFAST 90 tablet 1  . losartan-hydrochlorothiazide (HYZAAR) 100-12.5 MG tablet Take 1 tablet by mouth daily.    . magnesium oxide (MAG-OX) 400 MG tablet Take 400 mg by mouth daily.    . nortriptyline (PAMELOR) 10 MG capsule Take 1 capsule (10 mg total) by mouth at bedtime. 90 capsule 5  . pantoprazole (PROTONIX) 40 MG tablet Take 1 tablet (40 mg total) by mouth daily. 90 tablet 3  . Canagliflozin-Metformin HCl [phone] MG TABS Take 1 tablet by mouth 2 (two) times daily. 180 tablet 1  . Insulin Detemir (LEVEMIR FLEXTOUCH) 100 UNIT/ML Pen Inject 60 Units into the skin daily at 10 pm. DX 250.72 30 mL 2  . Insulin Lispro, Human, (HUMALOG KWIKPEN) 200 UNIT/ML SOPN Inject 5 Units into the skin 3 (three) times daily with meals as needed. (Patient taking differently: Inject 5 Units into the skin 3 (three) times daily with meals as  needed (for high blood sugar). ) 3 mL 11  . ibuprofen (ADVIL,MOTRIN) 600 MG tablet Take 1 tablet (600 mg total) by mouth every 8 (eight) hours as needed. (Patient not taking: Reported on 05/11/2015) 30 tablet 0  . Pitavastatin Calcium (LIVALO) 2 MG TABS Take 2 mg by mouth daily. Reported on 08/04/2015     No facility-administered medications prior to visit.    ROS Review of Systems  Constitutional: Positive for unexpected weight change. Negative for fever, chills, diaphoresis, appetite change and fatigue.  HENT: Negative.   Eyes: Negative.  Negative for visual disturbance.  Respiratory: Negative.  Negative for cough, choking, shortness of breath and stridor.   Cardiovascular: Negative.  Negative for chest pain, palpitations and leg swelling.  Gastrointestinal: Negative.  Negative for nausea, vomiting, abdominal pain, diarrhea and constipation.  Endocrine: Positive for polyuria. Negative for polydipsia and polyphagia.  Genitourinary: Positive for dysuria. Negative for urgency, frequency, hematuria, flank pain, decreased urine volume, enuresis and difficulty urinating.  Musculoskeletal: Negative.  Negative for myalgias, back pain, joint swelling and arthralgias.  Skin: Negative.  Negative for color change and rash.  Allergic/Immunologic: Negative.   Neurological: Negative.   Hematological: Negative.  Negative for adenopathy. Does not bruise/bleed easily.  Psychiatric/Behavioral: Negative.     Objective:  BP 110/60 mmHg  Pulse 69  Temp(Src) 97.6 F (36.4 C) (Oral)  Resp 16  Ht 5\' 5"  (1.651 m)  Wt 179 lb (81.194 kg)  BMI 29.79 kg/m2  SpO2 97%  BP Readings from Last 3 Encounters:  08/04/15 110/60  06/11/15 153/67  05/11/15 120/80    Wt Readings from Last 3 Encounters:  08/04/15 179 lb (81.194 kg)  06/11/15 177 lb (80.287 kg)  04/21/15 177 lb (80.287 kg)    Physical Exam  Constitutional: She is oriented to person, place, and time.  Non-toxic appearance. She does not have a  sickly appearance. She does not appear ill. No distress.  HENT:  Mouth/Throat: Oropharynx is clear and moist. No oropharyngeal exudate.  Eyes: Conjunctivae are normal. Right eye exhibits no discharge. Left eye exhibits no discharge. No scleral icterus.  Neck: Normal range of motion. Neck supple. No JVD present. No tracheal deviation present. No thyroid mass and no thyromegaly present.  Cardiovascular: Normal rate, regular rhythm, normal heart sounds and intact distal pulses.  Exam reveals no gallop and no friction rub.   No murmur heard. Pulmonary/Chest: Effort normal and breath sounds normal. No stridor. No respiratory distress. She has no wheezes. She has no rales. She exhibits no tenderness.  Abdominal: Soft. Bowel sounds are normal. She exhibits no distension and no mass. There is no tenderness. There is no rebound and no guarding.  Musculoskeletal: Normal range of motion. She exhibits no edema or tenderness.  Lymphadenopathy:    She has no cervical adenopathy.  Neurological: She is oriented to person, place, and time.  Skin: Skin is warm and dry. No rash noted. She is not diaphoretic. No erythema. No pallor.  Vitals reviewed.   Lab Results  Component Value Date   WBC 12.3* 08/04/2015   HGB 14.0 08/04/2015   HCT 43.0 08/04/2015   PLT 298.0 08/04/2015   GLUCOSE 172* 08/04/2015   CHOL 144 01/27/2015   TRIG 124.0 01/27/2015   HDL 44.40 01/27/2015   LDLDIRECT 160.2 03/01/2010   LDLCALC 75 01/27/2015   ALT 10 01/27/2015   AST 12 01/27/2015   NA 137 08/04/2015   K 4.3 08/04/2015   CL 100 08/04/2015   CREATININE 1.12 08/04/2015   BUN 21 08/04/2015   CO2 28 08/04/2015   TSH 0.25* 08/04/2015   INR 1.05 07/16/2014   HGBA1C 9.7* 08/04/2015   MICROALBUR 30.9* 01/27/2015    No results found.  Assessment & Plan:   Theresa Harrington was seen today for diabetes, hypertension, hyperlipidemia and urinary tract infection.  Diagnoses and all orders for this visit:  Diabetes mellitus with  peripheral vascular disease ([place])- her A1c is up to 9.7%, her blood sugars are not well-controlled. Also she is having another urinary tract infection so she will have to stop taking the SGLT2 inhibitor, she will stay on metformin and will try to get better blood sugar control with higher doses of insulin. -     Hemoglobin A1c; Future -     Basic metabolic panel; Future -     pravastatin (PRAVACHOL) 40 MG tablet; Take 1 tablet (40 mg total) by mouth daily. -     metFORMIN (GLUCOPHAGE) 1000 MG tablet; Take 1 tablet (1,000 mg total) by mouth 2 (two) times daily with a meal. -     Discontinue: Insulin Detemir (LEVEMIR FLEXTOUCH) 100 UNIT/ML Pen; Inject 80 Units into the skin daily at 10 pm. DX 250.72 -     Insulin Lispro (HUMALOG KWIKPEN) 200 UNIT/ML SOPN; Inject 10 Units into the skin 3 (three) times daily with meals as needed. -     Insulin Detemir (LEVEMIR FLEXTOUCH) 100 UNIT/ML  Pen; Inject 80 Units into the skin daily at 10 pm. DX 250.72  Essential hypertension, benign- her blood pressure is adequately well controlled, electrolytes and renal function are stable. -     Basic metabolic panel; Future  GOITER, MULTINODULAR- examination of her thyroid gland is normal, she continues to have a slightly suppressed TSH but has no signs and symptoms of hyper or hypothyroidism. Her T3 and T4 in the normal range. -     TSH; Future -     T3; Future -     T4; Future  Anemia, iron deficiency- improvement noted -     CBC with Differential/Platelet; Future  Vitamin B12 deficiency neuropathy -     CBC with Differential/Platelet; Future  Dysuria- urine culture is pending -     Urinalysis, Routine w reflex microscopic (not at [facility]); Future -     CULTURE, URINE COMPREHENSIVE; Future -     sulfamethoxazole-trimethoprim (BACTRIM DS,SEPTRA DS) 800-160 MG tablet; Take 1 tablet by mouth 2 (two) times daily.  Atherosclerosis of native arteries of the extremities with ulceration (HCC) -     pravastatin (PRAVACHOL)  40 MG tablet; Take 1 tablet (40 mg total) by mouth daily.  PAD (peripheral artery disease) (H[facility]) -     pravastatin (PRAVACHOL) 40 MG tablet; Take 1 tablet (40 mg total) by mouth daily.  Morbid obesity due to excess calories ([place])- she will start contrave to help decrease her caloric intake and help with weight loss -     Naltrexone-Bupropion HCl ER (CONTRAVE) 8-90 MG TB12; Take 2 tablets by mouth 2 (two) times daily.  Urinary tract infection, site not specified- urinalysis is abnormal, urine culture is pending, will empirically treat with Bactrim. -     sulfamethoxazole-trimethoprim (BACTRIM DS,SEPTRA DS) 800-160 MG tablet; Take 1 tablet by mouth 2 (two) times daily.   I have discontinued Ms. Gervacio's ibuprofen, Canagliflozin-Metformin HCl, and Pitavastatin Calcium. I have also changed her Insulin Lispro. Additionally, I am having her start on pravastatin, Naltrexone-Bupropion HCl ER, metFORMIN, and sulfamethoxazole-trimethoprim. Lastly, I am having her maintain her aspirin EC, nortriptyline, pantoprazole, clopidogrel, losartan-hydrochlorothiazide, magnesium oxide, ezetimibe, gabapentin, glyBURIDE, and Insulin Detemir.  Meds ordered this encounter  Medications  . pravastatin (PRAVACHOL) 40 MG tablet    Sig: Take 1 tablet (40 mg total) by mouth daily.    Dispense:  90 tablet    Refill:  3  . Naltrexone-Bupropion HCl ER (CONTRAVE) 8-90 MG TB12    Sig: Take 2 tablets by mouth 2 (two) times daily.    Dispense:  120 tablet    Refill:  5  . metFORMIN (GLUCOPHAGE) 1000 MG tablet    Sig: Take 1 tablet (1,000 mg total) by mouth 2 (two) times daily with a meal.    Dispense:  180 tablet    Refill:  1  . DISCONTD: Insulin Detemir (LEVEMIR FLEXTOUCH) 100 UNIT/ML Pen    Sig: Inject 80 Units into the skin daily at 10 pm. DX 250.72    Dispense:  30 mL    Refill:  3  . Insulin Lispro (HUMALOG KWIKPEN) 200 UNIT/ML SOPN    Sig: Inject 10 Units into the skin 3 (three) times daily with meals as needed.     Dispense:  3 mL    Refill:  11  . Insulin Detemir (LEVEMIR FLEXTOUCH) 100 UNIT/ML Pen    Sig: Inject 80 Units into the skin daily at 10 pm. DX 250.72    Dispense:  30 mL    Refill:  3  . sulfamethoxazole-trimethoprim (BACTRIM DS,SEPTRA DS) 800-160 MG tablet    Sig: Take 1 tablet by mouth 2 (two) times daily.    Dispense:  14 tablet    Refill:  1     Follow-up: Return in about 4 months (around 12/04/2015).  Scarlette Calico, MD

## 2015-08-04 NOTE — Patient Instructions (Signed)

## 2015-08-04 NOTE — Telephone Encounter (Signed)
Needs call back in regards to antibiotic that was just sent over.  States this interacts with a medication patient is on.

## 2015-08-05 DIAGNOSIS — Z4651 Encounter for fitting and adjustment of gastric lap band: Secondary | ICD-10-CM | POA: Diagnosis not present

## 2015-08-05 DIAGNOSIS — E1151 Type 2 diabetes mellitus with diabetic peripheral angiopathy without gangrene: Secondary | ICD-10-CM | POA: Diagnosis not present

## 2015-08-05 DIAGNOSIS — L97511 Non-pressure chronic ulcer of other part of right foot limited to breakdown of skin: Secondary | ICD-10-CM | POA: Diagnosis not present

## 2015-08-05 DIAGNOSIS — I779 Disorder of arteries and arterioles, unspecified: Secondary | ICD-10-CM | POA: Diagnosis not present

## 2015-08-05 LAB — T3: T3 TOTAL: 124 ng/dL (ref 76–181)

## 2015-08-06 LAB — CULTURE, URINE COMPREHENSIVE
Colony Count: NO GROWTH
Organism ID, Bacteria: NO GROWTH

## 2015-08-19 ENCOUNTER — Other Ambulatory Visit: Payer: Self-pay | Admitting: Internal Medicine

## 2015-08-19 NOTE — Telephone Encounter (Signed)
Please advise 

## 2015-09-01 DIAGNOSIS — M25511 Pain in right shoulder: Secondary | ICD-10-CM | POA: Diagnosis not present

## 2015-09-09 ENCOUNTER — Encounter: Payer: Self-pay | Admitting: Internal Medicine

## 2015-09-22 ENCOUNTER — Encounter: Payer: Self-pay | Admitting: Internal Medicine

## 2015-09-22 DIAGNOSIS — Z0000 Encounter for general adult medical examination without abnormal findings: Secondary | ICD-10-CM | POA: Diagnosis not present

## 2015-09-23 ENCOUNTER — Telehealth: Payer: Self-pay

## 2015-09-23 NOTE — Telephone Encounter (Signed)
Informed pt assistance program levimir flexpens came in. She said will come pick in. In [facility] fridge after 1 today

## 2015-10-09 ENCOUNTER — Other Ambulatory Visit: Payer: Self-pay | Admitting: Internal Medicine

## 2015-10-14 DIAGNOSIS — Z8631 Personal history of diabetic foot ulcer: Secondary | ICD-10-CM | POA: Insufficient documentation

## 2015-10-14 DIAGNOSIS — E1151 Type 2 diabetes mellitus with diabetic peripheral angiopathy without gangrene: Secondary | ICD-10-CM | POA: Diagnosis not present

## 2015-10-14 DIAGNOSIS — Z89422 Acquired absence of other left toe(s): Secondary | ICD-10-CM | POA: Diagnosis not present

## 2015-10-21 ENCOUNTER — Ambulatory Visit (INDEPENDENT_AMBULATORY_CARE_PROVIDER_SITE_OTHER): Payer: PPO | Admitting: Internal Medicine

## 2015-10-21 ENCOUNTER — Encounter: Payer: Self-pay | Admitting: Internal Medicine

## 2015-10-21 ENCOUNTER — Other Ambulatory Visit: Payer: PPO

## 2015-10-21 VITALS — BP 120/80 | HR 68 | Temp 98.6°F | Resp 16 | Ht 65.0 in | Wt 176.0 lb

## 2015-10-21 DIAGNOSIS — R300 Dysuria: Secondary | ICD-10-CM

## 2015-10-21 DIAGNOSIS — E1151 Type 2 diabetes mellitus with diabetic peripheral angiopathy without gangrene: Secondary | ICD-10-CM

## 2015-10-21 DIAGNOSIS — N390 Urinary tract infection, site not specified: Secondary | ICD-10-CM

## 2015-10-21 DIAGNOSIS — G63 Polyneuropathy in diseases classified elsewhere: Secondary | ICD-10-CM

## 2015-10-21 DIAGNOSIS — G8929 Other chronic pain: Secondary | ICD-10-CM

## 2015-10-21 DIAGNOSIS — E538 Deficiency of other specified B group vitamins: Secondary | ICD-10-CM

## 2015-10-21 DIAGNOSIS — R102 Pelvic and perineal pain: Secondary | ICD-10-CM

## 2015-10-21 DIAGNOSIS — N949 Unspecified condition associated with female genital organs and menstrual cycle: Secondary | ICD-10-CM

## 2015-10-21 LAB — POCT URINALYSIS DIPSTICK
Bilirubin, UA: NEGATIVE
Blood, UA: POSITIVE
Glucose, UA: NEGATIVE
Ketones, UA: NEGATIVE
NITRITE UA: NEGATIVE
PH UA: 6
Spec Grav, UA: >=1.03
UROBILINOGEN UA: 0.2

## 2015-10-21 LAB — POCT GLYCOSYLATED HEMOGLOBIN (HGB A1C): HEMOGLOBIN A1C: 9.2

## 2015-10-21 MED ORDER — CYANOCOBALAMIN 1000 MCG/ML IJ SOLN
1000.0000 ug | Freq: Once | INTRAMUSCULAR | Status: AC
  Administered 2015-10-21: 1000 ug via INTRAMUSCULAR

## 2015-10-21 MED ORDER — PREGABALIN 75 MG PO CAPS: 75.0000 mg | ORAL_CAPSULE | Freq: Two times a day (BID) | ORAL | Status: DC

## 2015-10-21 MED ORDER — EXENATIDE ER 2 MG ~~LOC~~ PEN: 1.0000 | PEN_INJECTOR | SUBCUTANEOUS | Status: DC

## 2015-10-21 NOTE — Progress Notes (Signed)
Pre visit review using our clinic review tool, if applicable. No additional management support is needed unless otherwise documented below in the visit note. 

## 2015-10-21 NOTE — Progress Notes (Signed)
Subjective:  Patient ID: Theresa Harrington, female    DOB: Sep 20, 1955  Age: 59 y.o. MRN: [id number]  CC: Diabetes   HPI Theresa Harrington presents for follow-up on diabetes and concerns about dysuria and chronic pelvic pain. She complains over the last few weeks that she has had worsening dysuria, urgency, and frequency. Prior to that she has had chronic pelvic pain that was followed by urology (she tells me she last saw her urologist about a month ago). Her med list showed that she was taking gabapentin but she now says she has not been taking gabapentin. She is also due for follow-up on her blood sugar. She denies any recent episodes of visual disturbance, polyuria, polydipsia, or polyphagia. She does not use the Humalog insulin as directed.  Outpatient Prescriptions Prior to Visit  Medication Sig Dispense Refill  . aspirin EC 325 MG tablet Take 325 mg by mouth daily.    . clopidogrel (PLAVIX) 75 MG tablet Take 1 tablet (75 mg total) by mouth daily. 90 tablet 3  . ezetimibe (ZETIA) 10 MG tablet Take 1 tablet (10 mg total) by mouth daily. 90 tablet 3  . Insulin Detemir (LEVEMIR FLEXTOUCH) 100 UNIT/ML Pen Inject 80 Units into the skin daily at 10 pm. DX 250.72 30 mL 3  . losartan-hydrochlorothiazide (HYZAAR) 100-12.5 MG tablet Take 1 tablet by mouth daily.    [name] losartan-hydrochlorothiazide (HYZAAR) 100-12.5 MG tablet TAKE ONE TABLET BY MOUTH ONCE DAILY 90 tablet 2  . magnesium oxide (MAG-OX) 400 MG tablet Take 400 mg by mouth daily.    . metFORMIN (GLUCOPHAGE) 1000 MG tablet Take 1 tablet (1,000 mg total) by mouth 2 (two) times daily with a meal. 180 tablet 1  . Naltrexone-Bupropion HCl ER (CONTRAVE) 8-90 MG TB12 Take 2 tablets by mouth 2 (two) times daily. 120 tablet 5  . nortriptyline (PAMELOR) 10 MG capsule Take 1 capsule (10 mg total) by mouth at bedtime. 90 capsule 5  . pantoprazole (PROTONIX) 40 MG tablet Take 1 tablet (40 mg total) by mouth daily. 90 tablet 3  . pravastatin (PRAVACHOL) 40 MG tablet  Take 1 tablet (40 mg total) by mouth daily. 90 tablet 3  . gabapentin (NEURONTIN) 300 MG capsule     . gabapentin (NEURONTIN) 300 MG capsule TAKE ONE CAPSULE BY MOUTH THREE TIMES DAILY 90 capsule 11  . glyBURIDE (DIABETA) 5 MG tablet TAKE ONE TABLET BY MOUTH ONCE DAILY WITH BREAKFAST 90 tablet 1  . Insulin Lispro (HUMALOG KWIKPEN) 200 UNIT/ML SOPN Inject 10 Units into the skin 3 (three) times daily with meals as needed. (Patient not taking: Reported on 10/21/2015) 3 mL 11   No facility-administered medications prior to visit.    ROS Review of Systems  Constitutional: Negative.  Negative for fever, chills, diaphoresis, appetite change and fatigue.  HENT: Negative.   Eyes: Negative.  Negative for visual disturbance.  Respiratory: Negative.  Negative for cough, choking, chest tightness, shortness of breath and stridor.   Cardiovascular: Negative.  Negative for chest pain, palpitations and leg swelling.  Gastrointestinal: Negative.  Negative for nausea, vomiting, abdominal pain, diarrhea and constipation.  Endocrine: Negative.   Genitourinary: Positive for dysuria, urgency, hematuria and pelvic pain. Negative for frequency, flank pain, decreased urine volume, vaginal bleeding, vaginal discharge, difficulty urinating and vaginal pain.  Musculoskeletal: Negative.   Skin: Negative.  Negative for color change and rash.  Allergic/Immunologic: Negative.   Neurological: Negative.  Negative for dizziness.  Hematological: Negative.  Negative for adenopathy. Does not bruise/bleed easily.  Psychiatric/Behavioral: Negative.     Objective:  BP 120/80 mmHg  Pulse 68  Temp(Src) 98.6 F (37 C) (Oral)  Resp 16  Ht 5\' 5"  (1.651 m)  Wt 176 lb (79.833 kg)  BMI 29.29 kg/m2  SpO2 97%  BP Readings from Last 3 Encounters:  10/21/15 120/80  08/04/15 110/60  06/11/15 153/67    Wt Readings from Last 3 Encounters:  10/21/15 176 lb (79.833 kg)  08/04/15 179 lb (81.194 kg)  06/11/15 177 lb (80.287 kg)     Physical Exam  Constitutional: She is oriented to person, place, and time. No distress.  HENT:  Mouth/Throat: Oropharynx is clear and moist. No oropharyngeal exudate.  Eyes: Conjunctivae are normal. Right eye exhibits no discharge. Left eye exhibits no discharge. No scleral icterus.  Neck: Normal range of motion. Neck supple. No JVD present. No tracheal deviation present. No thyromegaly present.  Cardiovascular: Normal rate, regular rhythm, normal heart sounds and intact distal pulses.  Exam reveals no gallop and no friction rub.   No murmur heard. Pulmonary/Chest: Effort normal and breath sounds normal. No stridor. No respiratory distress. She has no wheezes. She has no rales. She exhibits no tenderness.  Abdominal: Soft. Bowel sounds are normal. She exhibits no distension and no mass. There is no tenderness. There is no rebound, no guarding and no CVA tenderness.  Musculoskeletal: Normal range of motion. She exhibits no edema or tenderness.  Lymphadenopathy:    She has no cervical adenopathy.  Neurological: She is oriented to person, place, and time.  Skin: Skin is warm and dry. No rash noted. She is not diaphoretic. No erythema. No pallor.  Vitals reviewed.   Lab Results  Component Value Date   WBC 12.3* 08/04/2015   HGB 14.0 08/04/2015   HCT 43.0 08/04/2015   PLT 298.0 08/04/2015   GLUCOSE 172* 08/04/2015   CHOL 144 01/27/2015   TRIG 124.0 01/27/2015   HDL 44.40 01/27/2015   LDLDIRECT 160.2 03/01/2010   LDLCALC 75 01/27/2015   ALT 10 01/27/2015   AST 12 01/27/2015   NA 137 08/04/2015   K 4.3 08/04/2015   CL 100 08/04/2015   CREATININE 1.12 08/04/2015   BUN 21 08/04/2015   CO2 28 08/04/2015   TSH 0.25* 08/04/2015   INR 1.05 07/16/2014   HGBA1C 9.2 10/21/2015   MICROALBUR 30.9* 01/27/2015    No results found.  Assessment & Plan:   Theresa Harrington was seen today for diabetes.  Diagnoses and all orders for this visit:  Diabetes mellitus with peripheral vascular disease  ([place])- her A1c is down to 9.2% which is better but still not adequately well controlled, I've asked her to add a GL P1 agonist to her current regimen and have also asked her to start using the mealtime insulin as directed. -     POCT HgB A1C -     Exenatide ER (BYDUREON) 2 MG PEN; Inject 1 Act into the skin once a week.  Vitamin B12 deficiency neuropathy -     cyanocobalamin ((VITAMIN B-12)) injection 1,000 mcg; Inject 1 mL (1,000 mcg total) into the muscle once.  Urinary tract infection, site not specified- her urine culture is positive for Escherichia coli, will start Bactrim DS -     CULTURE, URINE COMPREHENSIVE; Future -     POCT urinalysis dipstick -     sulfamethoxazole-trimethoprim (BACTRIM DS,SEPTRA DS) 800-160 MG tablet; Take 1 tablet by mouth 2 (two) times daily.  Dysuria -     CULTURE, URINE COMPREHENSIVE; Future -  POCT urinalysis dipstick  Chronic pelvic pain in female- I've asked her to try Lyrica for symptom relief. -     pregabalin (LYRICA) 75 MG capsule; Take 1 capsule (75 mg total) by mouth 2 (two) times daily.   I have discontinued Ms. Hults's gabapentin, glyBURIDE, and gabapentin. I am also having her start on Exenatide ER, pregabalin, and sulfamethoxazole-trimethoprim. Additionally, I am having her maintain her aspirin EC, nortriptyline, pantoprazole, clopidogrel, losartan-hydrochlorothiazide, magnesium oxide, ezetimibe, pravastatin, Naltrexone-Bupropion HCl ER, metFORMIN, Insulin Lispro, Insulin Detemir, and losartan-hydrochlorothiazide. We administered cyanocobalamin.  Meds ordered this encounter  Medications  . cyanocobalamin ((VITAMIN B-12)) injection 1,000 mcg    Sig:   . Exenatide ER (BYDUREON) 2 MG PEN    Sig: Inject 1 Act into the skin once a week.    Dispense:  4 each    Refill:  11  . pregabalin (LYRICA) 75 MG capsule    Sig: Take 1 capsule (75 mg total) by mouth 2 (two) times daily.    Dispense:  56 capsule    Refill:  0  .  sulfamethoxazole-trimethoprim (BACTRIM DS,SEPTRA DS) 800-160 MG tablet    Sig: Take 1 tablet by mouth 2 (two) times daily.    Dispense:  14 tablet    Refill:  1     Follow-up: Return in about 4 months (around 02/20/2016).  Scarlette Calico, MD

## 2015-10-21 NOTE — Patient Instructions (Signed)

## 2015-10-24 LAB — CULTURE, URINE COMPREHENSIVE: Colony Count: >=100000

## 2015-10-24 MED ORDER — SULFAMETHOXAZOLE-TRIMETHOPRIM 800-160 MG PO TABS: 1.0000 | ORAL_TABLET | Freq: Two times a day (BID) | ORAL | Status: AC

## 2015-11-25 ENCOUNTER — Ambulatory Visit (INDEPENDENT_AMBULATORY_CARE_PROVIDER_SITE_OTHER): Payer: PPO | Admitting: Internal Medicine

## 2015-11-25 VITALS — BP 128/78 | HR 63 | Temp 97.8°F | Resp 16 | Wt 176.0 lb

## 2015-11-25 DIAGNOSIS — E538 Deficiency of other specified B group vitamins: Secondary | ICD-10-CM | POA: Diagnosis not present

## 2015-11-25 DIAGNOSIS — K219 Gastro-esophageal reflux disease without esophagitis: Secondary | ICD-10-CM | POA: Diagnosis not present

## 2015-11-25 MED ORDER — DEXLANSOPRAZOLE 60 MG PO CPDR: 60.0000 mg | DELAYED_RELEASE_CAPSULE | Freq: Every day | ORAL | 3 refills | Status: DC

## 2015-11-25 MED ORDER — CYANOCOBALAMIN 1000 MCG/ML IJ SOLN
1000.0000 ug | Freq: Once | INTRAMUSCULAR | Status: AC
  Administered 2015-11-25: 1000 ug via INTRAMUSCULAR

## 2015-11-25 NOTE — Progress Notes (Signed)
Subjective:  Patient ID: Theresa Harrington, female    DOB: 06-04-1955  Age: 60 y.o. MRN: [id number]  CC: Gastroesophageal Reflux   HPI [name] presents for the complaint of persistent heartburn despite taking a proton pump inhibitor. She tells me that 40 mg of pantoprazole is not controlling her heartburn. She denies odynophagia, dysphagia, chest pain, hemoptysis, weight loss, or loss of appetite. She tells me she had an EGD many many years ago but she doesn't know if it was normal or abnormal. She has had heartburn for several decades.  Outpatient Medications Prior to Visit  Medication Sig Dispense Refill  . aspirin EC 325 MG tablet Take 325 mg by mouth daily.    . clopidogrel (PLAVIX) 75 MG tablet Take 1 tablet (75 mg total) by mouth daily. 90 tablet 3  . Exenatide ER (BYDUREON) 2 MG PEN Inject 1 Act into the skin once a week. 4 each 11  . ezetimibe (ZETIA) 10 MG tablet Take 1 tablet (10 mg total) by mouth daily. 90 tablet 3  . Insulin Detemir (LEVEMIR FLEXTOUCH) 100 UNIT/ML Pen Inject 80 Units into the skin daily at 10 pm. DX 250.72 30 mL 3  . Insulin Lispro (HUMALOG KWIKPEN) 200 UNIT/ML SOPN Inject 10 Units into the skin 3 (three) times daily with meals as needed. 3 mL 11  . losartan-hydrochlorothiazide (HYZAAR) 100-12.5 MG tablet TAKE ONE TABLET BY MOUTH ONCE DAILY 90 tablet 2  . magnesium oxide (MAG-OX) 400 MG tablet Take 400 mg by mouth daily.    . metFORMIN (GLUCOPHAGE) 1000 MG tablet Take 1 tablet (1,000 mg total) by mouth 2 (two) times daily with a meal. 180 tablet 1  . Naltrexone-Bupropion HCl ER (CONTRAVE) 8-90 MG TB12 Take 2 tablets by mouth 2 (two) times daily. 120 tablet 5  . nortriptyline (PAMELOR) 10 MG capsule Take 1 capsule (10 mg total) by mouth at bedtime. 90 capsule 5  . pravastatin (PRAVACHOL) 40 MG tablet Take 1 tablet (40 mg total) by mouth daily. 90 tablet 3  . pregabalin (LYRICA) 75 MG capsule Take 1 capsule (75 mg total) by mouth 2 (two) times daily. 56 capsule 0  .  losartan-hydrochlorothiazide (HYZAAR) 100-12.5 MG tablet Take 1 tablet by mouth daily.    . pantoprazole (PROTONIX) 40 MG tablet Take 1 tablet (40 mg total) by mouth daily. 90 tablet 3   No facility-administered medications prior to visit.     ROS Review of Systems  Constitutional: Negative.  Negative for activity change, appetite change, diaphoresis, fatigue and unexpected weight change.  HENT: Negative.  Negative for sinus pressure, trouble swallowing and voice change.   Eyes: Negative.   Respiratory: Negative for cough, choking, shortness of breath and stridor.   Cardiovascular: Negative for chest pain, palpitations and leg swelling.  Gastrointestinal: Negative.  Negative for abdominal pain, blood in stool, constipation, diarrhea, nausea and vomiting.  Endocrine: Negative.  Negative for polydipsia, polyphagia and polyuria.  Genitourinary: Negative.   Musculoskeletal: Negative for back pain and neck pain.  Skin: Negative.  Negative for color change and rash.  Allergic/Immunologic: Negative.   Neurological: Negative.  Negative for dizziness, weakness, numbness and headaches.  Hematological: Negative.  Negative for adenopathy. Does not bruise/bleed easily.  Psychiatric/Behavioral: Negative.     Objective:  BP 128/78   Pulse 63   Temp 97.8 F (36.6 C) (Oral)   Resp 20   Wt 176 lb (79.8 kg)   SpO2 97%   BMI 29.29 kg/m   BP Readings from Last  3 Encounters:  11/25/15 128/78  10/21/15 120/80  08/04/15 110/60    Wt Readings from Last 3 Encounters:  11/25/15 176 lb (79.8 kg)  10/21/15 176 lb (79.8 kg)  08/04/15 179 lb (81.2 kg)    Physical Exam  Constitutional: She is oriented to person, place, and time. No distress.  HENT:  Mouth/Throat: Oropharynx is clear and moist. No oropharyngeal exudate.  Eyes: Conjunctivae are normal. Right eye exhibits no discharge. Left eye exhibits no discharge. No scleral icterus.  Neck: Normal range of motion. Neck supple. No JVD present. No  tracheal deviation present. No thyromegaly present.  Cardiovascular: Normal rate, regular rhythm, normal heart sounds and intact distal pulses.  Exam reveals no gallop and no friction rub.   No murmur heard. Pulmonary/Chest: Effort normal and breath sounds normal. No stridor. No respiratory distress. She has no wheezes. She has no rales. She exhibits no tenderness.  Abdominal: Soft. Bowel sounds are normal. She exhibits no distension and no mass. There is no tenderness. There is no rebound and no guarding.  Musculoskeletal: Normal range of motion. She exhibits no edema or tenderness.  Lymphadenopathy:    She has no cervical adenopathy.  Neurological: She is oriented to person, place, and time.  Skin: Skin is warm and dry. No rash noted. She is not diaphoretic. No erythema. No pallor.  Vitals reviewed.   Lab Results  Component Value Date   WBC 12.3 (H) 08/04/2015   HGB 14.0 08/04/2015   HCT 43.0 08/04/2015   PLT 298.0 08/04/2015   GLUCOSE 172 (H) 08/04/2015   CHOL 144 01/27/2015   TRIG 124.0 01/27/2015   HDL 44.40 01/27/2015   LDLDIRECT 160.2 03/01/2010   LDLCALC 75 01/27/2015   ALT 10 01/27/2015   AST 12 01/27/2015   NA 137 08/04/2015   K 4.3 08/04/2015   CL 100 08/04/2015   CREATININE 1.12 08/04/2015   BUN 21 08/04/2015   CO2 28 08/04/2015   TSH 0.25 (L) 08/04/2015   INR 1.05 07/16/2014   HGBA1C 9.2 10/21/2015   MICROALBUR 30.9 (H) 01/27/2015    No results found.  Assessment & Plan:   Dorrine was seen today for gastroesophageal reflux.  Diagnoses and all orders for this visit:  B12 deficiency -     cyanocobalamin ((VITAMIN B-12)) injection 1,000 mcg; Inject 1 mL (1,000 mcg total) into the muscle once.  Gastroesophageal reflux disease without esophagitis- I've asked her to switch to Dexilant which is a more potent proton pump inhibitor, she doesn't sound like she has any upper GI pathology but I asked her to see GI to be screened for Barrett's esophagus and other  complications. -     dexlansoprazole (DEXILANT) 60 MG capsule; Take 1 capsule (60 mg total) by mouth daily. -     Ambulatory referral to Gastroenterology   I have discontinued Ms. Fackler's pantoprazole. I am also having her start on dexlansoprazole. Additionally, I am having her maintain her aspirin EC, nortriptyline, clopidogrel, magnesium oxide, ezetimibe, pravastatin, Naltrexone-Bupropion HCl ER, metFORMIN, Insulin Lispro, Insulin Detemir, losartan-hydrochlorothiazide, Exenatide ER, and pregabalin. We administered cyanocobalamin.  Meds ordered this encounter  Medications  . cyanocobalamin ((VITAMIN B-12)) injection 1,000 mcg  . dexlansoprazole (DEXILANT) 60 MG capsule    Sig: Take 1 capsule (60 mg total) by mouth daily.    Dispense:  90 capsule    Refill:  3     Follow-up: Return in about 4 months (around 03/27/2016).  Scarlette Calico, MD

## 2015-11-25 NOTE — Patient Instructions (Signed)

## 2015-11-25 NOTE — Progress Notes (Signed)
Pre visit review using our clinic review tool, if applicable. No additional management support is needed unless otherwise documented below in the visit note. 

## 2015-11-28 ENCOUNTER — Encounter: Payer: Self-pay | Admitting: Internal Medicine

## 2015-12-29 ENCOUNTER — Ambulatory Visit (INDEPENDENT_AMBULATORY_CARE_PROVIDER_SITE_OTHER): Payer: PPO | Admitting: Geriatric Medicine

## 2015-12-29 DIAGNOSIS — E538 Deficiency of other specified B group vitamins: Secondary | ICD-10-CM | POA: Diagnosis not present

## 2015-12-29 MED ORDER — CYANOCOBALAMIN 1000 MCG/ML IJ SOLN
1000.0000 ug | Freq: Once | INTRAMUSCULAR | Status: AC
  Administered 2015-12-29: 1000 ug via INTRAMUSCULAR

## 2016-02-03 ENCOUNTER — Ambulatory Visit (INDEPENDENT_AMBULATORY_CARE_PROVIDER_SITE_OTHER): Payer: PPO

## 2016-02-03 DIAGNOSIS — E538 Deficiency of other specified B group vitamins: Secondary | ICD-10-CM | POA: Diagnosis not present

## 2016-02-03 DIAGNOSIS — Z23 Encounter for immunization: Secondary | ICD-10-CM | POA: Diagnosis not present

## 2016-02-03 DIAGNOSIS — G63 Polyneuropathy in diseases classified elsewhere: Secondary | ICD-10-CM

## 2016-02-03 MED ORDER — CYANOCOBALAMIN 1000 MCG/ML IJ SOLN
1000.0000 ug | Freq: Once | INTRAMUSCULAR | Status: AC
  Administered 2016-02-03: 1000 ug via INTRAMUSCULAR

## 2016-02-15 DIAGNOSIS — Z89422 Acquired absence of other left toe(s): Secondary | ICD-10-CM | POA: Diagnosis not present

## 2016-02-15 DIAGNOSIS — E1151 Type 2 diabetes mellitus with diabetic peripheral angiopathy without gangrene: Secondary | ICD-10-CM | POA: Diagnosis not present

## 2016-02-15 DIAGNOSIS — Z8631 Personal history of diabetic foot ulcer: Secondary | ICD-10-CM | POA: Diagnosis not present

## 2016-02-23 ENCOUNTER — Other Ambulatory Visit: Payer: Self-pay | Admitting: Family Medicine

## 2016-02-23 DIAGNOSIS — Z1231 Encounter for screening mammogram for malignant neoplasm of breast: Secondary | ICD-10-CM

## 2016-02-29 ENCOUNTER — Other Ambulatory Visit (INDEPENDENT_AMBULATORY_CARE_PROVIDER_SITE_OTHER): Payer: PPO

## 2016-02-29 ENCOUNTER — Ambulatory Visit (INDEPENDENT_AMBULATORY_CARE_PROVIDER_SITE_OTHER): Payer: PPO | Admitting: Internal Medicine

## 2016-02-29 ENCOUNTER — Encounter: Payer: Self-pay | Admitting: Internal Medicine

## 2016-02-29 VITALS — BP 122/72 | HR 64 | Temp 97.5°F | Resp 16 | Ht 65.0 in | Wt 178.0 lb

## 2016-02-29 DIAGNOSIS — I7025 Atherosclerosis of native arteries of other extremities with ulceration: Secondary | ICD-10-CM | POA: Diagnosis not present

## 2016-02-29 DIAGNOSIS — E1151 Type 2 diabetes mellitus with diabetic peripheral angiopathy without gangrene: Secondary | ICD-10-CM

## 2016-02-29 DIAGNOSIS — I10 Essential (primary) hypertension: Secondary | ICD-10-CM

## 2016-02-29 DIAGNOSIS — E042 Nontoxic multinodular goiter: Secondary | ICD-10-CM

## 2016-02-29 DIAGNOSIS — E538 Deficiency of other specified B group vitamins: Secondary | ICD-10-CM

## 2016-02-29 LAB — COMPREHENSIVE METABOLIC PANEL
ALT: 14 U/L (ref 0–35)
AST: 15 U/L (ref 0–37)
Albumin: 4.2 g/dL (ref 3.5–5.2)
Alkaline Phosphatase: 53 U/L (ref 39–117)
BUN: 13 mg/dL (ref 6–23)
CHLORIDE: 103 meq/L (ref 96–112)
CO2: 29 meq/L (ref 19–32)
CREATININE: 0.74 mg/dL (ref 0.40–1.20)
Calcium: 9.8 mg/dL (ref 8.4–10.5)
GFR: 103.3 mL/min (ref >60.00)
Glucose, Bld: 103 mg/dL — ABNORMAL HIGH (ref 70–99)
Potassium: 4 mEq/L (ref 3.5–5.1)
SODIUM: 142 meq/L (ref 135–145)
Total Bilirubin: 0.4 mg/dL (ref 0.2–1.2)
Total Protein: 6.9 g/dL (ref 6.0–8.3)

## 2016-02-29 LAB — URINALYSIS, ROUTINE W REFLEX MICROSCOPIC
HGB URINE DIPSTICK: NEGATIVE
Ketones, ur: NEGATIVE
Leukocytes, UA: NEGATIVE
NITRITE: NEGATIVE
Specific Gravity, Urine: 1.025 (ref 1.000–1.030)
Total Protein, Urine: NEGATIVE
URINE GLUCOSE: NEGATIVE
UROBILINOGEN UA: 0.2 (ref 0.0–1.0)
pH: 5.5 (ref 5.0–8.0)

## 2016-02-29 LAB — LIPID PANEL
CHOL/HDL RATIO: 3
Cholesterol: 143 mg/dL (ref 0–200)
HDL: 48.6 mg/dL (ref >39.00)
LDL CALC: 69 mg/dL (ref 0–99)
NonHDL: 94.39
Triglycerides: 127 mg/dL (ref 0.0–149.0)
VLDL: 25.4 mg/dL (ref 0.0–40.0)

## 2016-02-29 LAB — CBC WITH DIFFERENTIAL/PLATELET
BASOS PCT: 0.4 % (ref 0.0–3.0)
Basophils Absolute: 0 10*3/uL (ref 0.0–0.1)
EOS ABS: 0.1 10*3/uL (ref 0.0–0.7)
Eosinophils Relative: 0.7 % (ref 0.0–5.0)
HEMATOCRIT: 36.4 % (ref 36.0–46.0)
Hemoglobin: 12.2 g/dL (ref 12.0–15.0)
Lymphocytes Relative: 26 % (ref 12.0–46.0)
Lymphs Abs: 2.3 10*3/uL (ref 0.7–4.0)
MCHC: 33.6 g/dL (ref 30.0–36.0)
MCV: 83.2 fl (ref 78.0–100.0)
MONO ABS: 0.5 10*3/uL (ref 0.1–1.0)
Monocytes Relative: 6.1 % (ref 3.0–12.0)
NEUTROS ABS: 5.9 10*3/uL (ref 1.4–7.7)
Neutrophils Relative %: 66.8 % (ref 43.0–77.0)
PLATELETS: 266 10*3/uL (ref 150.0–400.0)
RBC: 4.37 Mil/uL (ref 3.87–5.11)
RDW: 14.4 % (ref 11.5–15.5)
WBC: 8.8 10*3/uL (ref 4.0–10.5)

## 2016-02-29 LAB — POCT GLYCOSYLATED HEMOGLOBIN (HGB A1C): HEMOGLOBIN A1C: 9.2

## 2016-02-29 LAB — MICROALBUMIN / CREATININE URINE RATIO
Creatinine,U: 171.4 mg/dL
MICROALB UR: 2.7 mg/dL — AB (ref 0.0–1.9)
Microalb Creat Ratio: 1.6 mg/g (ref 0.0–30.0)

## 2016-02-29 MED ORDER — INSULIN ASPART 100 UNIT/ML FLEXPEN
10.0000 [IU] | PEN_INJECTOR | Freq: Three times a day (TID) | SUBCUTANEOUS | 11 refills | Status: DC
Start: 2016-02-29 — End: 2016-12-13

## 2016-02-29 MED ORDER — CYANOCOBALAMIN 1000 MCG/ML IJ SOLN
1000.0000 ug | Freq: Once | INTRAMUSCULAR | Status: AC
  Administered 2016-02-29: 1000 ug via INTRAMUSCULAR

## 2016-02-29 NOTE — Patient Instructions (Signed)

## 2016-02-29 NOTE — Progress Notes (Signed)
Pre visit review using our clinic review tool, if applicable. No additional management support is needed unless otherwise documented below in the visit note. 

## 2016-02-29 NOTE — Progress Notes (Signed)
Subjective:  Patient ID: Theresa Harrington, female    DOB: 1955-05-18  Age: 60 y.o. MRN: [id number]  CC: Hypertension and Diabetes   HPI Maisen Cart presents for follow-up on hypertension and diabetes. Her only complaint is fatigue. She doesn't think her blood sugars are well-controlled because she has not been using her quick acting insulin with meals. She also drinks 3 regular Pepsi's a day. She denies visual disturbance, polyuria, polydipsia, or polyphagia. She also denies any recent episodes of claudication or pain in her lower extremities.  Outpatient Medications Prior to Visit  Medication Sig Dispense Refill  . aspirin EC 325 MG tablet Take 325 mg by mouth daily.    . clopidogrel (PLAVIX) 75 MG tablet Take 1 tablet (75 mg total) by mouth daily. 90 tablet 3  . dexlansoprazole (DEXILANT) 60 MG capsule Take 1 capsule (60 mg total) by mouth daily. 90 capsule 3  . Exenatide ER (BYDUREON) 2 MG PEN Inject 1 Act into the skin once a week. 4 each 11  . ezetimibe (ZETIA) 10 MG tablet Take 1 tablet (10 mg total) by mouth daily. 90 tablet 3  . Insulin Detemir (LEVEMIR FLEXTOUCH) 100 UNIT/ML Pen Inject 80 Units into the skin daily at 10 pm. DX 250.72 30 mL 3  . losartan-hydrochlorothiazide (HYZAAR) 100-12.5 MG tablet TAKE ONE TABLET BY MOUTH ONCE DAILY 90 tablet 2  . magnesium oxide (MAG-OX) 400 MG tablet Take 400 mg by mouth daily.    . metFORMIN (GLUCOPHAGE) 1000 MG tablet Take 1 tablet (1,000 mg total) by mouth 2 (two) times daily with a meal. 180 tablet 1  . nortriptyline (PAMELOR) 10 MG capsule Take 1 capsule (10 mg total) by mouth at bedtime. 90 capsule 5  . pravastatin (PRAVACHOL) 40 MG tablet Take 1 tablet (40 mg total) by mouth daily. 90 tablet 3  . pregabalin (LYRICA) 75 MG capsule Take 1 capsule (75 mg total) by mouth 2 (two) times daily. 56 capsule 0  . Insulin Lispro (HUMALOG KWIKPEN) 200 UNIT/ML SOPN Inject 10 Units into the skin 3 (three) times daily with meals as needed. 3 mL 11  .  Naltrexone-Bupropion HCl ER (CONTRAVE) 8-90 MG TB12 Take 2 tablets by mouth 2 (two) times daily. 120 tablet 5   No facility-administered medications prior to visit.     ROS Review of Systems  Constitutional: Positive for fatigue. Negative for activity change, appetite change, diaphoresis, fever and unexpected weight change.  HENT: Negative.  Negative for trouble swallowing and voice change.   Eyes: Negative for visual disturbance.  Respiratory: Negative.  Negative for cough, choking, chest tightness, shortness of breath and stridor.   Cardiovascular: Negative.  Negative for chest pain, palpitations and leg swelling.  Gastrointestinal: Negative.  Negative for abdominal pain, constipation, diarrhea, nausea and vomiting.  Endocrine: Negative for cold intolerance, heat intolerance, polydipsia, polyphagia and polyuria.  Genitourinary: Negative.  Negative for difficulty urinating, flank pain, frequency, hematuria and urgency.  Musculoskeletal: Negative for arthralgias, back pain, joint swelling and myalgias.  Skin: Negative.  Negative for color change and rash.  Allergic/Immunologic: Negative.   Neurological: Negative.  Negative for dizziness.  Hematological: Negative.  Negative for adenopathy. Does not bruise/bleed easily.  Psychiatric/Behavioral: Negative.     Objective:  BP 122/72 (BP Location: Left Arm, Patient Position: Sitting, Cuff Size: Normal)   Pulse 64   Temp 97.5 F (36.4 C) (Oral)   Resp 16   Ht 5\' 5"  (1.651 m)   Wt 178 lb (80.7 kg)   SpO2  98%   BMI 29.62 kg/m   BP Readings from Last 3 Encounters:  02/29/16 122/72  11/25/15 128/78  10/21/15 120/80    Wt Readings from Last 3 Encounters:  02/29/16 178 lb (80.7 kg)  11/25/15 176 lb (79.8 kg)  10/21/15 176 lb (79.8 kg)    Physical Exam  Constitutional: She is oriented to person, place, and time. No distress.  HENT:  Mouth/Throat: Oropharynx is clear and moist. No oropharyngeal exudate.  Eyes: Conjunctivae are  normal. Right eye exhibits no discharge. Left eye exhibits no discharge. No scleral icterus.  Neck: Normal range of motion. Neck supple. No JVD present. No tracheal deviation present. No thyromegaly present.  Cardiovascular: Normal rate, regular rhythm, normal heart sounds and intact distal pulses.  Exam reveals no gallop and no friction rub.   No murmur heard. Pulmonary/Chest: Effort normal and breath sounds normal. No stridor. No respiratory distress. She has no wheezes. She has no rales. She exhibits no tenderness.  Abdominal: Soft. Bowel sounds are normal. She exhibits no distension and no mass. There is no tenderness. There is no rebound and no guarding.  Musculoskeletal: Normal range of motion. She exhibits no edema, tenderness or deformity.  Lymphadenopathy:    She has no cervical adenopathy.  Neurological: She is oriented to person, place, and time.  Skin: Skin is warm and dry. No rash noted. She is not diaphoretic. No erythema. No pallor.  Vitals reviewed.   Lab Results  Component Value Date   WBC 8.8 02/29/2016   HGB 12.2 02/29/2016   HCT 36.4 02/29/2016   PLT 266.0 02/29/2016   GLUCOSE 103 (H) 02/29/2016   CHOL 143 02/29/2016   TRIG 127.0 02/29/2016   HDL 48.60 02/29/2016   LDLDIRECT 160.2 03/01/2010   LDLCALC 69 02/29/2016   ALT 14 02/29/2016   AST 15 02/29/2016   NA 142 02/29/2016   K 4.0 02/29/2016   CL 103 02/29/2016   CREATININE 0.74 02/29/2016   BUN 13 02/29/2016   CO2 29 02/29/2016   TSH 0.79 02/29/2016   INR 1.05 07/16/2014   HGBA1C 9.2 02/29/2016   MICROALBUR 2.7 (H) 02/29/2016    No results found.  Assessment & Plan:   Cailen was seen today for hypertension and diabetes.  Diagnoses and all orders for this visit:  Atherosclerosis of native arteries of the extremities with ulceration ([place])- she has had no recent episodes of pain or claudication, will continue to mitigate her risk factors with LDL reduction, blood pressure control, and better blood  sugar control. -     Lipid panel; Future  Essential hypertension, benign- her blood pressure is well-controlled, lites and renal function are stable. -     Comprehensive metabolic panel; Future -     CBC with Differential/Platelet; Future -     Urinalysis, Routine w reflex microscopic (not at [facility]); Future  GOITER, MULTINODULAR- examination of the thyroid gland is unremarkable and she is euthyroid based on symptoms and thyroid function testing. -     Thyroid Panel With TSH; Future  Diabetes mellitus with peripheral vascular disease ([place])- her A1c remains at 9.2%, her blood sugars are not adequately well controlled, in addition to not drinking 3 Pepsi's a day she agrees to restart the quick acting insulin with meals and to continue the other meds for blood sugar control. -     Comprehensive metabolic panel; Future -     Cancel: Hemoglobin A1c; Future -     Microalbumin / creatinine urine ratio; Future -  insulin aspart (NOVOLOG FLEXPEN) 100 UNIT/ML FlexPen; Inject 10 Units into the skin 3 (three) times daily with meals. -     POCT glycosylated hemoglobin (Hb A1C)  B12 deficiency -     cyanocobalamin ((VITAMIN B-12)) injection 1,000 mcg; Inject 1 mL (1,000 mcg total) into the muscle once.   I have discontinued Ms. Misch's Naltrexone-Bupropion HCl ER. I have also changed her Insulin Lispro to insulin aspart. Additionally, I am having her maintain her aspirin EC, nortriptyline, clopidogrel, magnesium oxide, ezetimibe, pravastatin, metFORMIN, Insulin Detemir, losartan-hydrochlorothiazide, Exenatide ER, pregabalin, and dexlansoprazole. We administered cyanocobalamin.  Meds ordered this encounter  Medications  . insulin aspart (NOVOLOG FLEXPEN) 100 UNIT/ML FlexPen    Sig: Inject 10 Units into the skin 3 (three) times daily with meals.    Dispense:  15 mL    Refill:  11  . cyanocobalamin ((VITAMIN B-12)) injection 1,000 mcg     Follow-up: Return in about 4 months (around  06/28/2016).  Scarlette Calico, MD

## 2016-03-01 ENCOUNTER — Other Ambulatory Visit: Payer: Self-pay | Admitting: Internal Medicine

## 2016-03-01 ENCOUNTER — Encounter: Payer: Self-pay | Admitting: Internal Medicine

## 2016-03-01 LAB — THYROID PANEL WITH TSH
Free Thyroxine Index: 2.4 (ref 1.4–3.8)
T3 UPTAKE: 27 % (ref 22–35)
T4 TOTAL: 9 ug/dL (ref 4.5–12.0)
TSH: 0.79 mIU/L

## 2016-03-08 ENCOUNTER — Telehealth: Payer: Self-pay

## 2016-03-08 NOTE — Telephone Encounter (Signed)
Received and faxed patient assistance request for Levemir

## 2016-03-09 ENCOUNTER — Telehealth: Payer: Self-pay | Admitting: Internal Medicine

## 2016-03-09 MED ORDER — INSULIN PEN NEEDLE 32G X 4 MM MISC: 2 refills | Status: DC

## 2016-03-09 NOTE — Telephone Encounter (Signed)
Pt contacted and confirmed the number of times insulin is injected. erx sent for 90 days - injecting BID.

## 2016-03-09 NOTE — Telephone Encounter (Signed)
Patient called in requesting needles.  Patient uses [facility] in [place].

## 2016-03-09 NOTE — Addendum Note (Signed)
Addended by: Aviva Signs M on: 03/09/2016 10:44 AM   Modules accepted: Orders

## 2016-03-10 ENCOUNTER — Ambulatory Visit: Payer: PPO

## 2016-03-24 ENCOUNTER — Other Ambulatory Visit: Payer: Self-pay | Admitting: Internal Medicine

## 2016-03-24 DIAGNOSIS — E1151 Type 2 diabetes mellitus with diabetic peripheral angiopathy without gangrene: Secondary | ICD-10-CM

## 2016-03-27 ENCOUNTER — Telehealth: Payer: Self-pay | Admitting: *Deleted

## 2016-03-27 NOTE — Telephone Encounter (Signed)
I don't understand this message.  

## 2016-03-27 NOTE — Telephone Encounter (Signed)
Left msg on triage stating mail MD pt assisting forms checking to see if MD received. Forwarding msg to MD CMA...Johny Chess

## 2016-03-28 NOTE — Telephone Encounter (Signed)
[name],  Do you need this form.

## 2016-03-28 NOTE — Telephone Encounter (Signed)
Received today in PCP box. PCP has it to sign.

## 2016-03-28 NOTE — Telephone Encounter (Signed)
Theresa Harrington have you received any patient assistant forms from [facility]??.../lmb

## 2016-03-29 ENCOUNTER — Ambulatory Visit: Payer: PPO

## 2016-03-29 ENCOUNTER — Other Ambulatory Visit: Payer: Self-pay | Admitting: Surgery

## 2016-03-29 DIAGNOSIS — I739 Peripheral vascular disease, unspecified: Secondary | ICD-10-CM

## 2016-03-29 NOTE — Telephone Encounter (Signed)
The company called to check status can fax back to [facility] once he completes...Theresa Harrington

## 2016-03-29 NOTE — Telephone Encounter (Signed)
Contacted pt and informed that there is 2 spots on the form that she needs to sign. Once done, form will be ready to send back.

## 2016-04-03 NOTE — Telephone Encounter (Signed)
Received original for PCP to sign.

## 2016-04-04 MED ORDER — EZETIMIBE 10 MG PO TABS
10.0000 mg | ORAL_TABLET | Freq: Every day | ORAL | 3 refills | Status: DC
Start: 2016-04-04 — End: 2017-02-22

## 2016-04-04 MED ORDER — EZETIMIBE 10 MG PO TABS: 10.0000 mg | ORAL_TABLET | Freq: Every day | ORAL | 3 refills | Status: DC

## 2016-04-04 NOTE — Telephone Encounter (Signed)
Printed rx for PCP to sign

## 2016-04-04 NOTE — Addendum Note (Signed)
Addended by: Aviva Signs M on: 04/04/2016 12:09 PM   Modules accepted: Orders

## 2016-04-04 NOTE — Addendum Note (Signed)
Addended by: Aviva Signs M on: 04/04/2016 02:10 PM   Modules accepted: Orders

## 2016-04-05 ENCOUNTER — Telehealth: Payer: Self-pay | Admitting: Internal Medicine

## 2016-04-05 NOTE — Telephone Encounter (Signed)
States faxed over prescription assistance application for zetia and dosage clarification on levemir flex pen.  Requesting call back in regard.  Will fax again.

## 2016-04-06 NOTE — Telephone Encounter (Signed)
Faxed Levemir dosage to [facility]. Faxed informational - Zetia assistance form has been faxed.

## 2016-04-12 ENCOUNTER — Ambulatory Visit: Payer: PPO | Admitting: Nurse Practitioner

## 2016-04-12 DIAGNOSIS — Z0289 Encounter for other administrative examinations: Secondary | ICD-10-CM

## 2016-04-13 NOTE — Telephone Encounter (Signed)
Contacted pt. Pt is requesting Levemir sample. There is one box in frig. Is this for anyone in particular?

## 2016-04-13 NOTE — Telephone Encounter (Signed)
Did received assistance form back but was not signed.  Is faxing back over today for a second time.  Needs providers signature and faxed back.

## 2016-04-13 NOTE — Telephone Encounter (Signed)
Patient is totally out of zetia- she is asking if she can come get samples this morning while we wait for the mail

## 2016-04-13 NOTE — Telephone Encounter (Signed)
Pt informed

## 2016-04-13 NOTE — Telephone Encounter (Signed)
No, she can have it

## 2016-04-14 ENCOUNTER — Ambulatory Visit (INDEPENDENT_AMBULATORY_CARE_PROVIDER_SITE_OTHER): Payer: PPO

## 2016-04-14 DIAGNOSIS — E538 Deficiency of other specified B group vitamins: Secondary | ICD-10-CM

## 2016-04-14 MED ORDER — CYANOCOBALAMIN 1000 MCG/ML IJ SOLN
1000.0000 ug | Freq: Once | INTRAMUSCULAR | Status: AC
  Administered 2016-04-14: 1000 ug via INTRAMUSCULAR

## 2016-04-14 NOTE — Telephone Encounter (Signed)
Mailed another copy of the paper work to [employer] per telephone request.

## 2016-04-14 NOTE — Telephone Encounter (Signed)
Faxed again and mailed again.

## 2016-04-17 ENCOUNTER — Ambulatory Visit: Payer: 59 | Admitting: Surgery

## 2016-04-17 ENCOUNTER — Encounter (HOSPITAL_COMMUNITY): Payer: PPO

## 2016-04-21 ENCOUNTER — Encounter: Payer: Self-pay | Admitting: Surgery

## 2016-04-21 NOTE — Telephone Encounter (Signed)
Spoke to patient.  She does not want to use the vials.  Patient states to get with Dr. [name] on wording.

## 2016-04-21 NOTE — Telephone Encounter (Signed)
Can you call pt and ask her to call the [facility] number.  We requested the Levemir Flexpens.

## 2016-04-21 NOTE — Telephone Encounter (Signed)
Patient called in stating she got vials and not pen.  Patient needs this corrected and does not know what to do with vials. Please follow up in regard.

## 2016-04-25 NOTE — Telephone Encounter (Signed)
Tried calling [facility] on 12/22 and again on 12/26.  Their office is closed.  I will try again tomorrow.  Called patient and gave update.

## 2016-04-26 ENCOUNTER — Ambulatory Visit: Payer: PPO

## 2016-05-03 ENCOUNTER — Telehealth: Payer: Self-pay | Admitting: Internal Medicine

## 2016-05-03 NOTE — Telephone Encounter (Signed)
Not on A side in [facility] refrig.

## 2016-05-03 NOTE — Telephone Encounter (Signed)
Left vm for patient to pick up levemir flextouch pens.  Shipment came in.  Should be on A side.

## 2016-05-08 ENCOUNTER — Ambulatory Visit (INDEPENDENT_AMBULATORY_CARE_PROVIDER_SITE_OTHER): Payer: PPO | Admitting: Surgery

## 2016-05-08 ENCOUNTER — Encounter: Payer: Self-pay | Admitting: Surgery

## 2016-05-08 ENCOUNTER — Ambulatory Visit (HOSPITAL_COMMUNITY)
Admission: RE | Admit: 2016-05-08 | Discharge: 2016-05-08 | Disposition: A | Discharge status: Home / Self Care | Payer: PPO | Source: Ambulatory Visit | Attending: Surgery | Admitting: Surgery

## 2016-05-08 VITALS — BP 175/75 | HR 64 | Temp 96.9°F | Resp 18 | Ht 65.0 in | Wt 280.0 lb

## 2016-05-08 DIAGNOSIS — I743 Embolism and thrombosis of arteries of the lower extremities: Secondary | ICD-10-CM | POA: Diagnosis not present

## 2016-05-08 DIAGNOSIS — I70299 Other atherosclerosis of native arteries of extremities, unspecified extremity: Secondary | ICD-10-CM

## 2016-05-08 DIAGNOSIS — I70213 Atherosclerosis of native arteries of extremities with intermittent claudication, bilateral legs: Secondary | ICD-10-CM | POA: Diagnosis not present

## 2016-05-08 DIAGNOSIS — R9439 Abnormal result of other cardiovascular function study: Secondary | ICD-10-CM | POA: Diagnosis not present

## 2016-05-08 DIAGNOSIS — L97509 Non-pressure chronic ulcer of other part of unspecified foot with unspecified severity: Secondary | ICD-10-CM | POA: Diagnosis not present

## 2016-05-08 NOTE — Progress Notes (Signed)
Vascular and Vein Specialist of [facility]  Patient name: Theresa Harrington MRN: [id number] DOB: 12-02-1955 Sex: female  REASON FOR VISIT: follow up   HPI: This is a 61 y.o. female who is initially underwent left femoral-popliteal bypass graft by Dr. early on 02/06/2015. Unfortunately this occluded early. I took her back to the operating room on 06/09/2014 and redid her operation with Gore-Tex, 6 mm propatent. She was ultimately discharged from the hospital on IV antibiotics. She then began hyperbaric oxygen treatment. She ultimately developed necrotic changes to the left second toe. 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 Doppler studies in December 2016 reveal I passed graft occlusion. Her ABI was 0.68.  She is back today without complaints.  She denies any open wounds.  She walked 3 miles yesterday, having to stop occasionally for pain in her leg.  Past Medical History:  Diagnosis Date  . Anemia   . Anxiety   . Asymptomatic cholelithiasis   . Atherosclerosis of aorta ([place])   . GERD (gastroesophageal reflux disease)   . Hypertension   . Hypothyroidism    no meds  . PAD (peripheral artery disease) ([place])   . Peripheral arterial occlusive disease (H[facility])    lower extremities  . Pneumonia   . PONV (postoperative nausea and vomiting)   . Right ureteral stone   . Type 2 diabetes mellitus ([place])   . Wears glasses     Family History  Problem Relation Age of Onset  . Diabetes Father   . Heart disease Father   . Deep vein thrombosis Father   . Hyperlipidemia Father   . Diabetes Sister   . Heart disease Sister   . Deep vein thrombosis Sister   . Hyperlipidemia Sister   . Diabetes Brother   . Heart disease Brother   . Cancer Brother   . Hyperlipidemia Brother   . Colon cancer Maternal Aunt   . Diabetes Brother   . Diabetes Brother   . Kidney disease Mother   . Hyperlipidemia Mother   . Other Mother     AAA   and    Amputation  .  Arthritis Other   . Cancer Other     colon  . Hypertension Other   . Stroke Other     SOCIAL HISTORY: Social History  Substance Use Topics  . Smoking status: Former Smoker    Packs/day: 1.00    Years: 28.00    Types: Cigarettes    Quit date: 09/05/2001  . Smokeless tobacco: Never Used  . Alcohol use No    Allergies  Allergen Reactions  . Amlodipine Swelling  . Atorvastatin Other (See Comments)    Reaction:  Muscle aches   . Ampicillin Nausea And Vomiting and Other (See Comments)    Has patient had a PCN reaction causing immediate rash, facial/tongue/throat swelling, SOB or lightheadedness with hypotension: No Has patient had a PCN reaction causing severe rash involving mucus membranes or skin necrosis: No Has patient had a PCN reaction that required hospitalization No Has patient had a PCN reaction occurring within the last 10 years: No If all of the above answers are "NO", then may proceed with Cephalosporin use.  . Codeine Nausea And Vomiting    Ask patient  . Lisinopril Cough       . Penicillins Nausea And Vomiting and Other (See Comments)    Has patient had a PCN reaction causing immediate rash, facial/tongue/throat swelling, SOB or lightheadedness with hypotension: No Has patient had a  PCN reaction causing severe rash involving mucus membranes or skin necrosis: No Has patient had a PCN reaction that required hospitalization No Has patient had a PCN reaction occurring within the last 10 years: No If all of the above answers are "NO", then may proceed with Cephalosporin use.  [name] [Sitagliptin] Nausea And Vomiting    Current Outpatient Prescriptions  Medication Sig Dispense Refill  . aspirin EC 325 MG tablet Take 325 mg by mouth daily.    . clopidogrel (PLAVIX) 75 MG tablet TAKE ONE TABLET BY MOUTH ONCE DAILY 30 tablet 11  . dexlansoprazole (DEXILANT) 60 MG capsule Take 1 capsule (60 mg total) by mouth daily. 90 capsule 3  . Exenatide ER (BYDUREON) 2 MG PEN  Inject 1 Act into the skin once a week. 4 each 11  . ezetimibe (ZETIA) 10 MG tablet Take 1 tablet (10 mg total) by mouth daily. 90 tablet 3  . insulin aspart (NOVOLOG FLEXPEN) 100 UNIT/ML FlexPen Inject 10 Units into the skin 3 (three) times daily with meals. 15 mL 11  . Insulin Detemir (LEVEMIR FLEXTOUCH) 100 UNIT/ML Pen Inject 80 Units into the skin daily at 10 pm. DX 250.72 30 mL 3  . Insulin Pen Needle (NOVOFINE PLUS) 32G X 4 MM MISC Use to inject insulin twice daily. DX E11.9 200 each 2  . losartan-hydrochlorothiazide (HYZAAR) 100-12.5 MG tablet TAKE ONE TABLET BY MOUTH ONCE DAILY 90 tablet 2  . magnesium oxide (MAG-OX) 400 MG tablet Take 400 mg by mouth daily.    . metFORMIN (GLUCOPHAGE) 1000 MG tablet TAKE ONE TABLET BY MOUTH TWICE DAILY WITH MEALS 180 tablet 1  . nortriptyline (PAMELOR) 10 MG capsule Take 1 capsule (10 mg total) by mouth at bedtime. 90 capsule 5  . pravastatin (PRAVACHOL) 40 MG tablet Take 1 tablet (40 mg total) by mouth daily. 90 tablet 3  . pregabalin (LYRICA) 75 MG capsule Take 1 capsule (75 mg total) by mouth 2 (two) times daily. 56 capsule 0   No current facility-administered medications for this visit.     REVIEW OF SYSTEMS:  [X]  denotes positive finding, [ ]  denotes negative finding Cardiac  Comments:  Chest pain or chest pressure:    Shortness of breath upon exertion:    Short of breath when lying flat:    Irregular heart rhythm:        Vascular    Pain in calf, thigh, or hip brought on by ambulation: x   Pain in feet at night that wakes you up from your sleep:     Blood clot in your veins:    Leg swelling:         Pulmonary    Oxygen at home:    Productive cough:     Wheezing:         Neurologic    Sudden weakness in arms or legs:     Sudden numbness in arms or legs:     Sudden onset of difficulty speaking or slurred speech:    Temporary loss of vision in one eye:     Problems with dizziness:         Gastrointestinal    Blood in stool:       Vomited blood:         Genitourinary    Burning when urinating:     Blood in urine:        Psychiatric    Major depression:         Hematologic  Bleeding problems:    Problems with blood clotting too easily:        Skin    Rashes or ulcers:        Constitutional    Fever or chills:      PHYSICAL EXAM: Vitals:   05/08/16 0850  BP: (!) 175/75  Pulse: 64  Resp: 18  Temp: (!) 96.9 F (36.1 C)  TempSrc: Oral  SpO2: 99%  Weight: 280 lb (127 kg)  Height: 5\' 5"  (1.651 m)    GENERAL: The patient is a well-nourished female, in no acute distress. The vital signs are documented above. CARDIAC: There is a regular rate and rhythm.  VASCULAR: Nonpalpable pedal pulses PULMONARY: Non-labored respirations MUSCULOSKELETAL: There are no major deformities or cyanosis. NEUROLOGIC: No focal weakness or paresthesias are detected. SKIN: There are no ulcers or rashes noted. PSYCHIATRIC: The patient has a normal affect.  DATA:  I have reviewed her ABIs.  The left is 0.57.  The right is 0.61.  Both are stable  MEDICAL ISSUES: Bilateral claudication: The patient's symptoms remain stable.  She is able to tolerate her daily activity level.  She does not have any open wounds.  I discussed with the patient that we will evaluate her on a yearly basis with ABIs.  As long as her symptoms remain stable, no intervention is recommended.  We also discussed the importance of taking excellent care of her feet.  She will contact me sooner if she develops problems.    [name], MD [facility] and Vein Specialists of [facility] [phone] Pager [phone]

## 2016-05-09 NOTE — Addendum Note (Signed)
Addended by: Lianne Cure A on: 05/09/2016 02:23 PM   Modules accepted: Orders

## 2016-05-26 ENCOUNTER — Encounter: Payer: Self-pay | Admitting: Cardiology

## 2016-05-26 ENCOUNTER — Ambulatory Visit
Admission: RE | Admit: 2016-05-26 | Discharge: 2016-05-26 | Disposition: A | Discharge status: Home / Self Care | Payer: PPO | Source: Ambulatory Visit | Attending: Family Medicine | Admitting: Family Medicine

## 2016-05-26 ENCOUNTER — Other Ambulatory Visit: Payer: Self-pay | Admitting: Family Medicine

## 2016-05-26 DIAGNOSIS — Z1231 Encounter for screening mammogram for malignant neoplasm of breast: Secondary | ICD-10-CM | POA: Diagnosis not present

## 2016-06-05 ENCOUNTER — Ambulatory Visit (INDEPENDENT_AMBULATORY_CARE_PROVIDER_SITE_OTHER): Payer: PPO | Admitting: Internal Medicine

## 2016-06-05 ENCOUNTER — Encounter: Payer: Self-pay | Admitting: Internal Medicine

## 2016-06-05 ENCOUNTER — Other Ambulatory Visit (INDEPENDENT_AMBULATORY_CARE_PROVIDER_SITE_OTHER): Payer: PPO

## 2016-06-05 VITALS — BP 140/70 | HR 60 | Temp 97.5°F | Resp 16 | Ht 65.0 in | Wt 181.2 lb

## 2016-06-05 DIAGNOSIS — E1151 Type 2 diabetes mellitus with diabetic peripheral angiopathy without gangrene: Secondary | ICD-10-CM

## 2016-06-05 DIAGNOSIS — D509 Iron deficiency anemia, unspecified: Secondary | ICD-10-CM

## 2016-06-05 DIAGNOSIS — K5904 Chronic idiopathic constipation: Secondary | ICD-10-CM | POA: Insufficient documentation

## 2016-06-05 DIAGNOSIS — E538 Deficiency of other specified B group vitamins: Secondary | ICD-10-CM

## 2016-06-05 DIAGNOSIS — G63 Polyneuropathy in diseases classified elsewhere: Secondary | ICD-10-CM | POA: Diagnosis not present

## 2016-06-05 DIAGNOSIS — I10 Essential (primary) hypertension: Secondary | ICD-10-CM

## 2016-06-05 LAB — MAGNESIUM: Magnesium: 1.5 mg/dL (ref 1.5–2.5)

## 2016-06-05 LAB — CBC WITH DIFFERENTIAL/PLATELET
Basophils Absolute: 0.1 10*3/uL (ref 0.0–0.1)
Basophils Relative: 0.6 % (ref 0.0–3.0)
EOS ABS: 0.1 10*3/uL (ref 0.0–0.7)
Eosinophils Relative: 1 % (ref 0.0–5.0)
HEMATOCRIT: 36.8 % (ref 36.0–46.0)
Hemoglobin: 12 g/dL (ref 12.0–15.0)
LYMPHS ABS: 3.1 10*3/uL (ref 0.7–4.0)
LYMPHS PCT: 28.2 % (ref 12.0–46.0)
MCHC: 32.6 g/dL (ref 30.0–36.0)
MCV: 85.7 fl (ref 78.0–100.0)
Monocytes Absolute: 0.6 10*3/uL (ref 0.1–1.0)
Monocytes Relative: 5.3 % (ref 3.0–12.0)
NEUTROS PCT: 64.9 % (ref 43.0–77.0)
Neutro Abs: 7.2 10*3/uL (ref 1.4–7.7)
Platelets: 272 10*3/uL (ref 150.0–400.0)
RBC: 4.29 Mil/uL (ref 3.87–5.11)
RDW: 14.3 % (ref 11.5–15.5)
WBC: 11.1 10*3/uL — ABNORMAL HIGH (ref 4.0–10.5)

## 2016-06-05 LAB — COMPREHENSIVE METABOLIC PANEL
ALBUMIN: 4 g/dL (ref 3.5–5.2)
ALK PHOS: 58 U/L (ref 39–117)
ALT: 10 U/L (ref 0–35)
AST: 10 U/L (ref 0–37)
BUN: 21 mg/dL (ref 6–23)
CO2: 26 mEq/L (ref 19–32)
CREATININE: 0.76 mg/dL (ref 0.40–1.20)
Calcium: 9.7 mg/dL (ref 8.4–10.5)
Chloride: 102 mEq/L (ref 96–112)
GFR: 100.08 mL/min (ref >60.00)
GLUCOSE: 234 mg/dL — AB (ref 70–99)
POTASSIUM: 4 meq/L (ref 3.5–5.1)
SODIUM: 136 meq/L (ref 135–145)
TOTAL PROTEIN: 6.9 g/dL (ref 6.0–8.3)
Total Bilirubin: 0.3 mg/dL (ref 0.2–1.2)

## 2016-06-05 MED ORDER — LUBIPROSTONE 24 MCG PO CAPS: 24.0000 ug | ORAL_CAPSULE | Freq: Two times a day (BID) | ORAL | 1 refills | Status: DC

## 2016-06-05 MED ORDER — CYANOCOBALAMIN 1000 MCG/ML IJ SOLN
1000.0000 ug | Freq: Once | INTRAMUSCULAR | Status: AC
  Administered 2016-06-05: 1000 ug via INTRAMUSCULAR

## 2016-06-05 MED ORDER — INSULIN DETEMIR 100 UNIT/ML FLEXPEN: 120.0000 [IU] | PEN_INJECTOR | Freq: Every day | SUBCUTANEOUS | 3 refills | Status: DC

## 2016-06-05 NOTE — Progress Notes (Signed)
Subjective:  Patient ID: Theresa Harrington, female    DOB: Jan 11, 1956  Age: 61 y.o. MRN: [id number]  CC: Diabetes   HPI Theresa Harrington presents for Concerns that she can achieve better blood sugar control. She complains that her blood sugars are consistently in the 200 to 230 range. She has stopped using Bydureon because she didn't think it was helping. She tells me that a dose of NovoLog will bring her blood sugar down into the 140 range. She denies polys. She also complains of chronic, recurrent episodes of constipation. She now tells me she has not had a bowel movement for 7 days and she feels a little bloated. She has tried laxatives and fiber with no relief from her symptoms.  Outpatient Medications Prior to Visit  Medication Sig Dispense Refill  . aspirin EC 325 MG tablet Take 325 mg by mouth daily.    . clopidogrel (PLAVIX) 75 MG tablet TAKE ONE TABLET BY MOUTH ONCE DAILY 30 tablet 11  . ezetimibe (ZETIA) 10 MG tablet Take 1 tablet (10 mg total) by mouth daily. 90 tablet 3  . insulin aspart (NOVOLOG FLEXPEN) 100 UNIT/ML FlexPen Inject 10 Units into the skin 3 (three) times daily with meals. 15 mL 11  . Insulin Pen Needle (NOVOFINE PLUS) 32G X 4 MM MISC Use to inject insulin twice daily. DX E11.9 200 each 2  . losartan-hydrochlorothiazide (HYZAAR) 100-12.5 MG tablet TAKE ONE TABLET BY MOUTH ONCE DAILY 90 tablet 2  . metFORMIN (GLUCOPHAGE) 1000 MG tablet TAKE ONE TABLET BY MOUTH TWICE DAILY WITH MEALS 180 tablet 1  . pravastatin (PRAVACHOL) 40 MG tablet Take 1 tablet (40 mg total) by mouth daily. 90 tablet 3  . dexlansoprazole (DEXILANT) 60 MG capsule Take 1 capsule (60 mg total) by mouth daily. 90 capsule 3  . Insulin Detemir (LEVEMIR FLEXTOUCH) 100 UNIT/ML Pen Inject 80 Units into the skin daily at 10 pm. DX 250.72 30 mL 3  . magnesium oxide (MAG-OX) 400 MG tablet Take 400 mg by mouth daily.    . pregabalin (LYRICA) 75 MG capsule Take 1 capsule (75 mg total) by mouth 2 (two) times daily. 56  capsule 0  . Exenatide ER (BYDUREON) 2 MG PEN Inject 1 Act into the skin once a week. (Patient not taking: Reported on 06/05/2016) 4 each 11  . nortriptyline (PAMELOR) 10 MG capsule Take 1 capsule (10 mg total) by mouth at bedtime. (Patient not taking: Reported on 06/05/2016) 90 capsule 5   No facility-administered medications prior to visit.     ROS Review of Systems  Constitutional: Negative for diaphoresis, fatigue and unexpected weight change.  HENT: Negative.   Eyes: Negative for visual disturbance.  Respiratory: Negative for cough, chest tightness, shortness of breath and wheezing.   Cardiovascular: Negative for chest pain, palpitations and leg swelling.  Gastrointestinal: Positive for constipation. Negative for abdominal pain, diarrhea, nausea and vomiting.  Endocrine: Negative for polydipsia, polyphagia and polyuria.  Genitourinary: Negative.  Negative for difficulty urinating, flank pain, frequency, pelvic pain and urgency.  Musculoskeletal: Negative for back pain and myalgias.  Skin: Negative.   Allergic/Immunologic: Negative.   Neurological: Negative.  Negative for dizziness, weakness, light-headedness and headaches.  Hematological: Negative.  Negative for adenopathy. Does not bruise/bleed easily.  Psychiatric/Behavioral: Negative.     Objective:  BP 140/70 (BP Location: Left Arm, Patient Position: Sitting, Cuff Size: Normal)   Pulse 60   Temp 97.5 F (36.4 C) (Oral)   Resp 16   Ht 5\' 5"  (1.651  m)   Wt 181 lb 4 oz (82.2 kg)   SpO2 97%   BMI 30.16 kg/m   BP Readings from Last 3 Encounters:  06/05/16 140/70  05/08/16 (!) 175/75  02/29/16 122/72    Wt Readings from Last 3 Encounters:  06/05/16 181 lb 4 oz (82.2 kg)  05/08/16 280 lb (127 kg)  02/29/16 178 lb (80.7 kg)    Physical Exam  Constitutional: No distress.  HENT:  Mouth/Throat: Oropharynx is clear and moist. No oropharyngeal exudate.  Eyes: Conjunctivae are normal. Right eye exhibits no discharge. Left  eye exhibits no discharge. No scleral icterus.  Neck: Normal range of motion. Neck supple. No JVD present. No tracheal deviation present. No thyromegaly present.  Cardiovascular: Normal rate, regular rhythm, normal heart sounds and intact distal pulses.  Exam reveals no gallop and no friction rub.   No murmur heard. Pulmonary/Chest: Effort normal and breath sounds normal. No stridor. No respiratory distress. She has no wheezes. She has no rales. She exhibits no tenderness.  Abdominal: Soft. Normal appearance. She exhibits no mass. Bowel sounds are decreased. There is hepatomegaly. There is no hepatosplenomegaly or splenomegaly. There is no tenderness. There is no rebound, no guarding and no CVA tenderness.  Musculoskeletal: Normal range of motion. She exhibits no edema, tenderness or deformity.  Lymphadenopathy:    She has no cervical adenopathy.  Skin: Skin is warm and dry. No rash noted. She is not diaphoretic. No erythema. No pallor.  Vitals reviewed.   Lab Results  Component Value Date   WBC 11.1 Repeated and verified X2. (H) 06/05/2016   HGB 12.0 06/05/2016   HCT 36.8 06/05/2016   PLT 272.0 06/05/2016   GLUCOSE 234 (H) 06/05/2016   CHOL 143 02/29/2016   TRIG 127.0 02/29/2016   HDL 48.60 02/29/2016   LDLDIRECT 160.2 03/01/2010   LDLCALC 69 02/29/2016   ALT 10 06/05/2016   AST 10 06/05/2016   NA 136 06/05/2016   K 4.0 06/05/2016   CL 102 06/05/2016   CREATININE 0.76 06/05/2016   BUN 21 06/05/2016   CO2 26 06/05/2016   TSH 0.79 02/29/2016   INR 1.05 07/16/2014   HGBA1C 10.0 (H) 06/05/2016   MICROALBUR 2.7 (H) 02/29/2016    Mm Screening Breast Tomo Bilateral  Result Date: 05/26/2016 CLINICAL DATA:  Screening. History of bilateral breast lift surgery. EXAM: 2D DIGITAL SCREENING BILATERAL MAMMOGRAM WITH CAD AND ADJUNCT TOMO COMPARISON:  Previous exam(s). ACR Breast Density Category b: There are scattered areas of fibroglandular density. FINDINGS: There are stable postsurgical  changes within each breast. There are no findings suspicious for malignancy within either breast. Images were processed with CAD. IMPRESSION: No mammographic evidence of malignancy. A result letter of this screening mammogram will be mailed directly to the patient. RECOMMENDATION: Screening mammogram in one year. (Code:SM-B-01Y) BI-RADS CATEGORY  2: Benign. Electronically Signed   By: Franki Cabot M.D.   On: 05/26/2016 13:04    Assessment & Plan:   Dalaysia was seen today for diabetes.  Diagnoses and all orders for this visit:  Iron deficiency anemia, unspecified iron deficiency anemia type- improvement noted, will continue iron replacement therapy. -     cyanocobalamin ((VITAMIN B-12)) injection 1,000 mcg; Inject 1 mL (1,000 mcg total) into the muscle once. -     CBC with Differential/Platelet; Future  Diabetes mellitus with peripheral vascular disease ([place])- her A1c is up to 10%, her blood sugars are not adequately well controlled. I've asked her to increase her Levemir dose 220  units a day and to continue the NovoLog diligently. At her request,  Will discontinue bydureon. -     Insulin Detemir (LEVEMIR FLEXTOUCH) 100 UNIT/ML Pen; Inject 120 Units into the skin daily at 10 pm. DX 250.72 -     Comprehensive metabolic panel; Future -     Hemoglobin A1c; Future  Chronic idiopathic constipation- her labs are negative for any secondary or organic causes and she doesn't take any medications that cause constipation. I've asked her try Amitiza. -     lubiprostone (AMITIZA) 24 MCG capsule; Take 1 capsule (24 mcg total) by mouth 2 (two) times daily with a meal. -     Magnesium; Future  Essential hypertension, benign- her blood pressure is adequately well controlled, electrolytes and renal function are normal. -     Comprehensive metabolic panel; Future  Vitamin B12 deficiency neuropathy ([place])- will continue B12 replacement. -     CBC with Differential/Platelet; Future   I have discontinued Ms.  Jobin's nortriptyline, magnesium oxide, Exenatide ER, pregabalin, and dexlansoprazole. I have also changed her Insulin Detemir. Additionally, I am having her start on lubiprostone. Lastly, I am having her maintain her aspirin EC, pravastatin, losartan-hydrochlorothiazide, insulin aspart, Insulin Pen Needle, metFORMIN, clopidogrel, and ezetimibe. We administered cyanocobalamin.  Meds ordered this encounter  Medications  . cyanocobalamin ((VITAMIN B-12)) injection 1,000 mcg  . Insulin Detemir (LEVEMIR FLEXTOUCH) 100 UNIT/ML Pen    Sig: Inject 120 Units into the skin daily at 10 pm. DX 250.72    Dispense:  30 mL    Refill:  3  . lubiprostone (AMITIZA) 24 MCG capsule    Sig: Take 1 capsule (24 mcg total) by mouth 2 (two) times daily with a meal.    Dispense:  180 capsule    Refill:  1     Follow-up: Return in about 3 months (around 09/02/2016).  Scarlette Calico, MD

## 2016-06-05 NOTE — Progress Notes (Signed)
Pre visit review using our clinic review tool, if applicable. No additional management support is needed unless otherwise documented below in the visit note. 

## 2016-06-05 NOTE — Patient Instructions (Signed)

## 2016-06-06 ENCOUNTER — Ambulatory Visit: Payer: PPO | Admitting: Internal Medicine

## 2016-06-06 ENCOUNTER — Encounter: Payer: Self-pay | Admitting: Internal Medicine

## 2016-06-06 LAB — HEMOGLOBIN A1C: Hgb A1c MFr Bld: 10 % — ABNORMAL HIGH (ref 4.6–6.5)

## 2016-06-07 ENCOUNTER — Telehealth: Payer: Self-pay | Admitting: Internal Medicine

## 2016-06-07 NOTE — Telephone Encounter (Signed)
No show

## 2016-06-12 ENCOUNTER — Telehealth: Payer: Self-pay | Admitting: Internal Medicine

## 2016-06-12 DIAGNOSIS — K5904 Chronic idiopathic constipation: Secondary | ICD-10-CM

## 2016-06-12 NOTE — Telephone Encounter (Signed)
States she should have had a script for magnesium sent to [facility] but does not have it.  Also, states that she can not use the copay card with amitiza therefore can not get this script filled.  Is requesting something in place of this to be sent to [facility] in [place].

## 2016-06-13 ENCOUNTER — Other Ambulatory Visit: Payer: Self-pay | Admitting: Internal Medicine

## 2016-06-13 DIAGNOSIS — K5904 Chronic idiopathic constipation: Secondary | ICD-10-CM

## 2016-06-13 MED ORDER — MAGNESIUM OXIDE 400 MG PO TABS: 400.0000 mg | ORAL_TABLET | Freq: Every day | ORAL | 1 refills | Status: DC

## 2016-06-16 ENCOUNTER — Other Ambulatory Visit: Payer: Self-pay | Admitting: Internal Medicine

## 2016-06-16 NOTE — Telephone Encounter (Signed)
Patient states this medication is not still at [place]. Can you send it again.   Also she states that the Amitiza cost to much. It will cost her a 100 a month. I informed her to call her insurance and find out what they will cover. She will call back once she has that information.

## 2016-06-16 NOTE — Telephone Encounter (Signed)
Inform pt about medication is being prepare for her a pharmacy.  Pt is interesting in pt assistant for [place].

## 2016-06-19 MED ORDER — LUBIPROSTONE 24 MCG PO CAPS: 24.0000 ug | ORAL_CAPSULE | Freq: Two times a day (BID) | ORAL | 1 refills | Status: DC

## 2016-06-19 NOTE — Addendum Note (Signed)
Addended by: Karle Barr on: 06/19/2016 03:42 PM   Modules accepted: Orders

## 2016-06-19 NOTE — Telephone Encounter (Signed)
Pt informed patient assistance form will be ready for pick up tomorrow.

## 2016-06-27 ENCOUNTER — Ambulatory Visit (INDEPENDENT_AMBULATORY_CARE_PROVIDER_SITE_OTHER): Payer: PPO | Admitting: Internal Medicine

## 2016-06-27 ENCOUNTER — Encounter: Payer: Self-pay | Admitting: Internal Medicine

## 2016-06-27 VITALS — BP 112/72 | HR 60 | Temp 97.6°F | Resp 16 | Ht 65.0 in | Wt 182.0 lb

## 2016-06-27 DIAGNOSIS — J069 Acute upper respiratory infection, unspecified: Secondary | ICD-10-CM

## 2016-06-27 DIAGNOSIS — J988 Other specified respiratory disorders: Secondary | ICD-10-CM | POA: Insufficient documentation

## 2016-06-27 DIAGNOSIS — B9789 Other viral agents as the cause of diseases classified elsewhere: Secondary | ICD-10-CM

## 2016-06-27 MED ORDER — HYDROCODONE-HOMATROPINE 5-1.5 MG/5ML PO SYRP: 5.0000 mL | ORAL_SOLUTION | Freq: Three times a day (TID) | ORAL | 0 refills | Status: DC | PRN

## 2016-06-27 NOTE — Patient Instructions (Signed)
Upper Respiratory Infection, Adult Most upper respiratory infections (URIs) are caused by a virus. A URI affects the nose, throat, and upper air passages. The most common type of URI is often called "the common cold." Follow these instructions at home:  Take medicines only as told by your doctor.  Gargle warm saltwater or take cough drops to comfort your throat as told by your doctor.  Use a warm mist humidifier or inhale steam from a shower to increase air moisture. This may make it easier to breathe.  Drink enough fluid to keep your pee (urine) clear or pale yellow.  Eat soups and other clear broths.  Have a healthy diet.  Rest as needed.  Go back to work when your fever is gone or your doctor says it is okay.  You may need to stay home longer to avoid giving your URI to others.  You can also wear a face mask and wash your hands often to prevent spread of the virus.  Use your inhaler more if you have asthma.  Do not use any tobacco products, including cigarettes, chewing tobacco, or electronic cigarettes. If you need help quitting, ask your doctor. Contact a doctor if:  You are getting worse, not better.  Your symptoms are not helped by medicine.  You have chills.  You are getting more short of breath.  You have brown or red mucus.  You have yellow or brown discharge from your nose.  You have pain in your face, especially when you bend forward.  You have a fever.  You have puffy (swollen) neck glands.  You have pain while swallowing.  You have white areas in the back of your throat. Get help right away if:  You have very bad or constant:  Headache.  Ear pain.  Pain in your forehead, behind your eyes, and over your cheekbones (sinus pain).  Chest pain.  You have long-lasting (chronic) lung disease and any of the following:  Wheezing.  Long-lasting cough.  Coughing up blood.  A change in your usual mucus.  You have a stiff neck.  You have  changes in your:  Vision.  Hearing.  Thinking.  Mood. This information is not intended to replace advice given to you by your health care provider. Make sure you discuss any questions you have with your health care provider. Document Released: 10/04/2007 Document Revised: 12/19/2015 Document Reviewed: 07/23/2013 Elsevier Interactive Patient Education  2017 [employer]  

## 2016-06-27 NOTE — Progress Notes (Signed)
Subjective:  Patient ID: Theresa Harrington, female    DOB: 15-Feb-1956  Age: 61 y.o. MRN: [id number]  CC: Cough   HPI Theresa Harrington presents for a one-week history of nonproductive cough, postnasal drip, fatigue, and mild wheezing.  Outpatient Medications Prior to Visit  Medication Sig Dispense Refill  . aspirin EC 325 MG tablet Take 325 mg by mouth daily.    . clopidogrel (PLAVIX) 75 MG tablet TAKE ONE TABLET BY MOUTH ONCE DAILY 30 tablet 11  . ezetimibe (ZETIA) 10 MG tablet Take 1 tablet (10 mg total) by mouth daily. 90 tablet 3  . insulin aspart (NOVOLOG FLEXPEN) 100 UNIT/ML FlexPen Inject 10 Units into the skin 3 (three) times daily with meals. 15 mL 11  . Insulin Detemir (LEVEMIR FLEXTOUCH) 100 UNIT/ML Pen Inject 120 Units into the skin daily at 10 pm. DX 250.72 30 mL 3  . Insulin Pen Needle (NOVOFINE PLUS) 32G X 4 MM MISC Use to inject insulin twice daily. DX E11.9 200 each 2  . losartan-hydrochlorothiazide (HYZAAR) 100-12.5 MG tablet Take 1 tablet by mouth daily. 90 tablet 1  . magnesium oxide (MAG-OX) 400 MG tablet Take 1 tablet (400 mg total) by mouth daily. 90 tablet 1  . metFORMIN (GLUCOPHAGE) 1000 MG tablet TAKE ONE TABLET BY MOUTH TWICE DAILY WITH MEALS 180 tablet 1  . pravastatin (PRAVACHOL) 40 MG tablet Take 1 tablet (40 mg total) by mouth daily. 90 tablet 3  . lubiprostone (AMITIZA) 24 MCG capsule Take 1 capsule (24 mcg total) by mouth 2 (two) times daily with a meal. (Patient not taking: Reported on 06/27/2016) 180 capsule 1   No facility-administered medications prior to visit.     ROS Review of Systems  Constitutional: Positive for fatigue. Negative for chills, diaphoresis and fever.  HENT: Negative.  Negative for congestion, facial swelling, postnasal drip, sinus pain, sinus pressure, sneezing, sore throat, tinnitus, trouble swallowing and voice change.   Eyes: Negative.   Respiratory: Positive for cough and wheezing. Negative for chest tightness and shortness of breath.     Cardiovascular: Negative.  Negative for chest pain, palpitations and leg swelling.  Gastrointestinal: Negative for abdominal pain, blood in stool, constipation, diarrhea, nausea and vomiting.  Endocrine: Negative.   Genitourinary: Negative.  Negative for difficulty urinating.  Musculoskeletal: Negative.  Negative for back pain, myalgias and neck pain.  Skin: Negative.  Negative for rash.  Allergic/Immunologic: Negative.   Neurological: Negative.  Negative for dizziness and weakness.  Hematological: Negative for adenopathy. Does not bruise/bleed easily.  Psychiatric/Behavioral: Negative.     Objective:  BP 112/72 (BP Location: Left Arm, Patient Position: Sitting, Cuff Size: Normal)   Pulse 60   Temp 97.6 F (36.4 C) (Oral)   Resp 16   Ht 5\' 5"  (1.651 m)   Wt 182 lb (82.6 kg)   SpO2 95%   BMI 30.29 kg/m   BP Readings from Last 3 Encounters:  06/27/16 112/72  06/05/16 140/70  05/08/16 (!) 175/75    Wt Readings from Last 3 Encounters:  06/27/16 182 lb (82.6 kg)  06/05/16 181 lb 4 oz (82.2 kg)  05/08/16 280 lb (127 kg)    Physical Exam  Constitutional: She is oriented to person, place, and time.  Non-toxic appearance. She does not have a sickly appearance. She does not appear ill. No distress.  HENT:  Mouth/Throat: Oropharynx is clear and moist. No oropharyngeal exudate.  Eyes: Conjunctivae are normal. Right eye exhibits no discharge. Left eye exhibits no discharge. No scleral  icterus.  Neck: Normal range of motion. Neck supple. No JVD present. No tracheal deviation present. No thyromegaly present.  Cardiovascular: Normal rate, regular rhythm, normal heart sounds and intact distal pulses.  Exam reveals no gallop and no friction rub.   No murmur heard. Pulmonary/Chest: Effort normal and breath sounds normal. No stridor. No respiratory distress. She has no wheezes. She has no rales. She exhibits no tenderness.  Abdominal: Soft. Bowel sounds are normal. She exhibits no  distension and no mass. There is no tenderness. There is no rebound and no guarding.  Musculoskeletal: Normal range of motion. She exhibits no edema, tenderness or deformity.  Lymphadenopathy:    She has no cervical adenopathy.  Neurological: She is oriented to person, place, and time.  Skin: Skin is warm and dry. No rash noted. She is not diaphoretic. No erythema. No pallor.  Vitals reviewed.   Lab Results  Component Value Date   WBC 11.1 Repeated and verified X2. (H) 06/05/2016   HGB 12.0 06/05/2016   HCT 36.8 06/05/2016   PLT 272.0 06/05/2016   GLUCOSE 234 (H) 06/05/2016   CHOL 143 02/29/2016   TRIG 127.0 02/29/2016   HDL 48.60 02/29/2016   LDLDIRECT 160.2 03/01/2010   LDLCALC 69 02/29/2016   ALT 10 06/05/2016   AST 10 06/05/2016   NA 136 06/05/2016   K 4.0 06/05/2016   CL 102 06/05/2016   CREATININE 0.76 06/05/2016   BUN 21 06/05/2016   CO2 26 06/05/2016   TSH 0.79 02/29/2016   INR 1.05 07/16/2014   HGBA1C 10.0 (H) 06/05/2016   MICROALBUR 2.7 (H) 02/29/2016    Mm Screening Breast Tomo Bilateral  Result Date: 05/26/2016 CLINICAL DATA:  Screening. History of bilateral breast lift surgery. EXAM: 2D DIGITAL SCREENING BILATERAL MAMMOGRAM WITH CAD AND ADJUNCT TOMO COMPARISON:  Previous exam(s). ACR Breast Density Category b: There are scattered areas of fibroglandular density. FINDINGS: There are stable postsurgical changes within each breast. There are no findings suspicious for malignancy within either breast. Images were processed with CAD. IMPRESSION: No mammographic evidence of malignancy. A result letter of this screening mammogram will be mailed directly to the patient. RECOMMENDATION: Screening mammogram in one year. (Code:SM-B-01Y) BI-RADS CATEGORY  2: Benign. Electronically Signed   By: Franki Cabot M.D.   On: 05/26/2016 13:04    Assessment & Plan:   Theresa Harrington was seen today for cough.  Diagnoses and all orders for this visit:  Viral upper respiratory tract infection  with cough -     HYDROcodone-homatropine (HYCODAN) 5-1.5 MG/5ML syrup; Take 5 mLs by mouth every 8 (eight) hours as needed for cough.   I am having Theresa Harrington start on HYDROcodone-homatropine. I am also having her maintain her aspirin EC, pravastatin, insulin aspart, Insulin Pen Needle, metFORMIN, clopidogrel, ezetimibe, Insulin Detemir, magnesium oxide, losartan-hydrochlorothiazide, and lubiprostone.  Meds ordered this encounter  Medications  . HYDROcodone-homatropine (HYCODAN) 5-1.5 MG/5ML syrup    Sig: Take 5 mLs by mouth every 8 (eight) hours as needed for cough.    Dispense:  120 mL    Refill:  0     Follow-up: Return if symptoms worsen or fail to improve.  Scarlette Calico, MD

## 2016-06-27 NOTE — Progress Notes (Signed)
Pre visit review using our clinic review tool, if applicable. No additional management support is needed unless otherwise documented below in the visit note. 

## 2016-06-28 ENCOUNTER — Telehealth: Payer: Self-pay | Admitting: Internal Medicine

## 2016-06-28 ENCOUNTER — Other Ambulatory Visit: Payer: Self-pay | Admitting: Internal Medicine

## 2016-06-28 DIAGNOSIS — J988 Other specified respiratory disorders: Secondary | ICD-10-CM

## 2016-06-28 MED ORDER — AZITHROMYCIN 500 MG PO TABS: 500.0000 mg | ORAL_TABLET | Freq: Every day | ORAL | 0 refills | Status: AC

## 2016-06-28 MED ORDER — HYDROCOD POLST-CPM POLST ER 10-8 MG/5ML PO SUER: 5.0000 mL | Freq: Two times a day (BID) | ORAL | 0 refills | Status: DC | PRN

## 2016-06-28 NOTE — Telephone Encounter (Signed)
Stef, can you please make sure scripts are up front before I call patient?

## 2016-06-28 NOTE — Telephone Encounter (Signed)
Patient states she does want an antibiotic called into her pharmacy at [facility] in [place].  See OV from 2/27.  Patient states Dr. [name] asked her then if she wanted antibiotic but she denied b/c she didn't think this needed but now wants.

## 2016-06-28 NOTE — Telephone Encounter (Signed)
[id number] written She will have to come get them and take to her pharmacy

## 2016-06-28 NOTE — Telephone Encounter (Signed)
Patient also states that cough syrup she was given yesterday does not work.  She is requesting tussinex.

## 2016-06-29 NOTE — Telephone Encounter (Signed)
Called patient to notify

## 2016-07-12 ENCOUNTER — Other Ambulatory Visit: Payer: Self-pay | Admitting: Internal Medicine

## 2016-07-12 DIAGNOSIS — K5904 Chronic idiopathic constipation: Secondary | ICD-10-CM

## 2016-07-12 MED ORDER — LUBIPROSTONE 24 MCG PO CAPS: 24.0000 ug | ORAL_CAPSULE | Freq: Two times a day (BID) | ORAL | 3 refills | Status: DC

## 2016-07-17 ENCOUNTER — Telehealth: Payer: Self-pay | Admitting: Internal Medicine

## 2016-07-17 NOTE — Telephone Encounter (Signed)
Levada Dy from [employer] [phone] option 2) is needing a form for Patient assistance so they can help the patient get this Rx at a lower cost  lubiprostone (AMITIZA) 24 MCG capsule

## 2016-07-17 NOTE — Telephone Encounter (Signed)
Informed pt that they need information from her for Patient Assistance. Pt is aware.  Information needed from PCP has been faxed.

## 2016-07-27 ENCOUNTER — Telehealth: Payer: Self-pay | Admitting: Internal Medicine

## 2016-07-27 NOTE — Telephone Encounter (Signed)
States has not received forms that need to be signed off on by Dr. [name].  Would like forms to be refax to [phone].

## 2016-07-31 ENCOUNTER — Ambulatory Visit (INDEPENDENT_AMBULATORY_CARE_PROVIDER_SITE_OTHER): Payer: PPO

## 2016-07-31 ENCOUNTER — Other Ambulatory Visit: Payer: Self-pay | Admitting: Internal Medicine

## 2016-07-31 ENCOUNTER — Telehealth: Payer: Self-pay

## 2016-07-31 DIAGNOSIS — E538 Deficiency of other specified B group vitamins: Secondary | ICD-10-CM | POA: Diagnosis not present

## 2016-07-31 DIAGNOSIS — E1151 Type 2 diabetes mellitus with diabetic peripheral angiopathy without gangrene: Secondary | ICD-10-CM

## 2016-07-31 MED ORDER — CYANOCOBALAMIN 1000 MCG/ML IJ SOLN
1000.0000 ug | Freq: Once | INTRAMUSCULAR | Status: AC
  Administered 2016-07-31: 1000 ug via INTRAMUSCULAR

## 2016-07-31 MED ORDER — GLIMEPIRIDE 2 MG PO TABS: 2.0000 mg | ORAL_TABLET | Freq: Every day | ORAL | 1 refills | Status: DC

## 2016-07-31 NOTE — Telephone Encounter (Signed)
Faxed and mailed again.

## 2016-07-31 NOTE — Telephone Encounter (Signed)
Patient states her glucose readings were not being controlled well with novolog and bydureon---she has stopped taking those and would like to go back with glipiride 500mg ---she has already started back on that and her readings are much better controlled----are you ok with her staying on glipiride, at least until she sees you in couple of months----also, since she has started back on glipiride, do you want her to continue with novolog also----please advise, thanks---call patient at [phone]

## 2016-08-08 ENCOUNTER — Other Ambulatory Visit: Payer: Self-pay | Admitting: Internal Medicine

## 2016-08-08 DIAGNOSIS — E1151 Type 2 diabetes mellitus with diabetic peripheral angiopathy without gangrene: Secondary | ICD-10-CM

## 2016-08-18 DIAGNOSIS — Z09 Encounter for follow-up examination after completed treatment for conditions other than malignant neoplasm: Secondary | ICD-10-CM | POA: Diagnosis not present

## 2016-08-22 DIAGNOSIS — E1151 Type 2 diabetes mellitus with diabetic peripheral angiopathy without gangrene: Secondary | ICD-10-CM | POA: Diagnosis not present

## 2016-08-22 DIAGNOSIS — Z89422 Acquired absence of other left toe(s): Secondary | ICD-10-CM | POA: Diagnosis not present

## 2016-08-22 DIAGNOSIS — Z8631 Personal history of diabetic foot ulcer: Secondary | ICD-10-CM | POA: Diagnosis not present

## 2016-08-28 ENCOUNTER — Other Ambulatory Visit: Payer: Self-pay | Admitting: Internal Medicine

## 2016-08-28 DIAGNOSIS — E1151 Type 2 diabetes mellitus with diabetic peripheral angiopathy without gangrene: Secondary | ICD-10-CM

## 2016-08-28 DIAGNOSIS — I739 Peripheral vascular disease, unspecified: Secondary | ICD-10-CM

## 2016-08-28 DIAGNOSIS — I7025 Atherosclerosis of native arteries of other extremities with ulceration: Secondary | ICD-10-CM

## 2016-08-29 ENCOUNTER — Other Ambulatory Visit: Payer: Self-pay

## 2016-08-29 ENCOUNTER — Encounter: Payer: Self-pay | Admitting: Internal Medicine

## 2016-08-29 ENCOUNTER — Ambulatory Visit (INDEPENDENT_AMBULATORY_CARE_PROVIDER_SITE_OTHER): Payer: PPO | Admitting: Internal Medicine

## 2016-08-29 VITALS — BP 118/62 | HR 56 | Ht 65.0 in | Wt 183.0 lb

## 2016-08-29 DIAGNOSIS — E1151 Type 2 diabetes mellitus with diabetic peripheral angiopathy without gangrene: Secondary | ICD-10-CM | POA: Diagnosis not present

## 2016-08-29 DIAGNOSIS — E1165 Type 2 diabetes mellitus with hyperglycemia: Secondary | ICD-10-CM

## 2016-08-29 DIAGNOSIS — IMO0002 Reserved for concepts with insufficient information to code with codable children: Secondary | ICD-10-CM

## 2016-08-29 MED ORDER — BAYER MICROLET LANCETS MISC: 5 refills | Status: DC

## 2016-08-29 MED ORDER — BAYER CONTOUR NEXT MONITOR W/DEVICE KIT: PACK | 0 refills | Status: DC

## 2016-08-29 MED ORDER — GLUCOSE BLOOD VI STRP: ORAL_STRIP | 5 refills | Status: DC

## 2016-08-29 NOTE — Patient Instructions (Addendum)
Please decrease Levemir to 80 units at bedtime  Please increase Novolog as follows: - 14 units before a smaller meal - 18 units before a larger meals  Please continue Metformin 1000 mg 2x a day with meals.  Stop Amaryl.  Please let me know if the sugars are consistently <80 or >200.  Please come back for a follow-up appointment in 3 months.  Read the following books: Dr. Alyssa Grove - Program for Reversing Diabetes Dr. Karl Luke - Prevent and Reverse Heart Disease Dr. Alden Benjamin - How Not to Die  PATIENT INSTRUCTIONS FOR TYPE 2 DIABETES:  **Please join MyChart!** - see attached instructions about how to join if you have not done so already.  DIET AND EXERCISE Diet and exercise is an important part of diabetic treatment.  We recommended aerobic exercise in the form of brisk walking (working between 40-60% of maximal aerobic capacity, similar to brisk walking) for 150 minutes per week (such as 30 minutes five days per week) along with 3 times per week performing 'resistance' training (using various gauge rubber tubes with handles) 5-10 exercises involving the major muscle groups (upper body, lower body and core) performing 10-15 repetitions (or near fatigue) each exercise. Start at half the above goal but build slowly to reach the above goals. If limited by weight, joint pain, or disability, we recommend daily walking in a swimming pool with water up to waist to reduce pressure from joints while allow for adequate exercise.    BLOOD GLUCOSES Monitoring your blood glucoses is important for continued management of your diabetes. Please check your blood glucoses 2-4 times a day: fasting, before meals and at bedtime (you can rotate these measurements - e.g. one day check before the 3 meals, the next day check before 2 of the meals and before bedtime, etc.).   HYPOGLYCEMIA (low blood sugar) Hypoglycemia is usually a reaction to not eating, exercising, or taking too much insulin/  other diabetes drugs.  Symptoms include tremors, sweating, hunger, confusion, headache, etc. Treat IMMEDIATELY with 15 grams of Carbs: . 4 glucose tablets .  cup regular juice/soda . 2 tablespoons raisins . 4 teaspoons sugar . 1 tablespoon honey Recheck blood glucose in 15 mins and repeat above if still symptomatic/blood glucose <100.  RECOMMENDATIONS TO REDUCE YOUR RISK OF DIABETIC COMPLICATIONS: * Take your prescribed MEDICATION(S) * Follow a DIABETIC diet: Complex carbs, fiber rich foods, (monounsaturated and polyunsaturated) fats * AVOID saturated/trans fats, high fat foods, >2,300 mg salt per day. * EXERCISE at least 5 times a week for 30 minutes or preferably daily.  * DO NOT SMOKE OR DRINK more than 1 drink a day. * Check your FEET every day. Do not wear tightfitting shoes. Contact us if you develop an ulcer * See your EYE doctor once a year or more if needed * Get a FLU shot once a year * Get a PNEUMONIA vaccine once before and once after age 49 years  GOALS:  * Your Hemoglobin A1c of <7%  * fasting sugars need to be <130 * after meals sugars need to be <180 (2h after you start eating) * Your Systolic BP should be 403 or lower  * Your Diastolic BP should be 80 or lower  * Your HDL (Good Cholesterol) should be 40 or higher  * Your LDL (Bad Cholesterol) should be 100 or lower. * Your Triglycerides should be 150 or lower  * Your Urine microalbumin (kidney function) should be <30 * Your Body Mass Index should  be 25 or lower    Please consider the following ways to cut down carbs and fat and increase fiber and micronutrients in your diet: - substitute whole grain for white bread or pasta - substitute brown rice for white rice - substitute 90-calorie flat bread pieces for slices of bread when possible - substitute sweet potatoes or yams for white potatoes - substitute humus for margarine - substitute tofu for cheese when possible - substitute almond or rice milk for  regular milk (would not drink soy milk daily due to concern for soy estrogen influence on breast cancer risk) - substitute dark chocolate for other sweets when possible - substitute water - can add lemon or orange slices for taste - for diet sodas (artificial sweeteners will trick your body that you can eat sweets without getting calories and will lead you to overeating and weight gain in the long run) - do not skip breakfast or other meals (this will slow down the metabolism and will result in more weight gain over time)  - can try smoothies made from fruit and almond/rice milk in am instead of regular breakfast - can also try old-fashioned (not instant) oatmeal made with almond/rice milk in am - order the dressing on the side when eating salad at a restaurant (pour less than half of the dressing on the salad) - eat as little meat as possible - can try juicing, but should not forget that juicing will get rid of the fiber, so would alternate with eating raw veg./fruits or drinking smoothies - use as little oil as possible, even when using olive oil - can dress a salad with a mix of balsamic vinegar and lemon juice, for e.g. - use agave nectar, stevia sugar, or regular sugar rather than artificial sweateners - steam or broil/roast veggies  - snack on veggies/fruit/nuts (unsalted, preferably) when possible, rather than processed foods - reduce or eliminate aspartame in diet (it is in diet sodas, chewing gum, etc) Read the labels!

## 2016-08-29 NOTE — Progress Notes (Signed)
Follow Patient ID: Theresa Harrington, female   DOB: 02-Oct-1955, 61 y.o.   MRN: [id number]   WUX:[id number] Theresa Harrington is a 61 y.o.-year-old female, referred by her PCP, Dr. [name], for management of DM2, dx in 60s, insulin-dependent since 2017, uncontrolled, with long-term complications (PAD  - s/p R 2nd toe amputation 2016).  Last hemoglobin A1c was: Lab Results  Component Value Date   HGBA1C 10.0 (H) 06/05/2016   HGBA1C 9.2 02/29/2016   HGBA1C 9.2 10/21/2015   Pt is on a regimen of: - Metformin 1000 mg 2x a day, with meals - Amaryl 2 mg before b'fast - Levemir 110 units at bedtime - Novolog 0-10 units 3x a day units 3x a day, before meals She stopped the Bydureon >> did not help.  Pt checks her sugars 1-2x a day and they are: - am: 47-137 - 2h after b'fast: n/c - before lunch: n/c - 2h after lunch: n/c - before dinner: n/c - 2h after dinner: n/c - bedtime: n/c - nighttime: 200-346 + lows. Lowest sugar was 47 ; she has hypoglycemia awareness at 60s.  Highest sugar was 346 1 month ago.  Glucometer: Precision Extra  Pt's meals are: - Breakfast: 1 boiled egg + 1 cheese toast + coffee - Lunch: salad or sausage croissant, etc. - Dinner: differs - Snacks: none  - no CKD, last BUN/creatinine:  Lab Results  Component Value Date   BUN 21 06/05/2016   BUN 13 02/29/2016   CREATININE 0.76 06/05/2016   CREATININE 0.74 02/29/2016  On Losartan. - last set of lipids: Lab Results  Component Value Date   CHOL 143 02/29/2016   HDL 48.60 02/29/2016   LDLCALC 69 02/29/2016   LDLDIRECT 160.2 03/01/2010   TRIG 127.0 02/29/2016   CHOLHDL 3 02/29/2016  On Zetia, Pravastatin. - last eye exam was in 2017. No DR.  - no numbness and tingling in her feet.  Pt has FH of DM in F,S, 3B.  Of note, she has a history of lap band or drink 2007, and following breast and thigh lift and abdominoplasty in 2009. She also has HTN, HL  ROS: Constitutional: +weight gain/loss, no fatigue, no subjective  hyperthermia/hypothermia, + nocturia Eyes: no blurry vision, no xerophthalmia ENT: no sore throat, no nodules palpated in throat, no dysphagia/odynophagia, no hoarseness Cardiovascular: no CP/SOB/palpitations/leg swelling Respiratory: no cough/SOB Gastrointestinal: no N/V/D/+ C Musculoskeletal: no muscle/joint aches Skin: no rashes Neurological: no tremors/numbness/tingling/dizziness Psychiatric: no depression/anxiety  Past Medical History:  Diagnosis Date  . Anemia   . Anxiety   . Asymptomatic cholelithiasis   . Atherosclerosis of aorta ([place])   . GERD (gastroesophageal reflux disease)   . Hypertension   . Hypothyroidism    no meds  . PAD (peripheral artery disease) ([place])   . Peripheral arterial occlusive disease ([facility])    lower extremities  . Pneumonia   . PONV (postoperative nausea and vomiting)   . Right ureteral stone   . Type 2 diabetes mellitus ([place])   . Wears glasses    Past Surgical History:  Procedure Laterality Date  . AMPUTATION Left 07/16/2014   Procedure: LEFT SECOND TOE AMPUTATION;  Surgeon: Serafina Mitchell, MD;  Location: [facility];  Service: Vascular;  Laterality: Left;  With Nerve block  . AUGMENTATION MAMMAPLASTY    . BELPHAROPTOSIS REPAIR     eyelid lift  . COMBINED AUGMENTATION MAMMAPLASTY AND ABDOMINOPLASTY  2009   W/  BILATERAL  THIGH LIFT  . CYSTO/  RIGHT URETERAL STENT PLACEMENT  12-30-2010  . CYSTOSCOPY WITH RETROGRADE PYELOGRAM, URETEROSCOPY AND STENT PLACEMENT Right 10/07/2013   Procedure: CYSTOSCOPY WITH RETROGRADE PYELOGRAM, right URETEROSCOPY AND STENT PLACEMENT, stone extraction;  Surgeon: Arvil Persons, MD;  Location: [facility];  Service: Urology;  Laterality: Right;  . CYSTOSCOPY WITH STENT PLACEMENT Right 06/04/2013   Procedure: CYSTOSCOPY WITH STENT PLACEMENT;  Surgeon: Franchot Gallo, MD;  Location: [facility];  Service: Urology;  Laterality: Right;  . DILATATION & CURETTAGE/HYSTEROSCOPY WITH MYOSURE N/A 04/27/2015   Procedure:  DILATATION & CURETTAGE/HYSTEROSCOPY WITH MYOSURE;  Surgeon: Terrance Mass, MD;  Location: [facility];  Service: Gynecology;  Laterality: N/A;  . ENDARTERECTOMY FEMORAL Left 06/08/2014   Procedure: Left Leg Common Femoral and External Iliac  Endartarectomy with patch Angioplasty;  Surgeon: Rosetta Posner, MD;  Location: [facility];  Service: Vascular;  Laterality: Left;  . EXTRACORPOREAL SHOCK WAVE LITHOTRIPSY Right 08-04-2013//   06-23-2013//   01-16-2011  . FEMORAL-POPLITEAL BYPASS GRAFT Left 06/08/2014   Procedure: Left Leg Femoral -Popliteal Bypass Graft;  Surgeon: Rosetta Posner, MD;  Location: [facility];  Service: Vascular;  Laterality: Left;  . FEMORAL-POPLITEAL BYPASS GRAFT Left 06/09/2014   Procedure: Left Femoral and Popliteal Exposure; Left Femoral to Anterior Tibial Bypass Graft using Propaten 5mm by 80cm Goretex Graft; Left Tibial Endarterectomy; Left Femoraland Popliteal Thrombectomy ;  Surgeon: Serafina Mitchell, MD;  Location: [facility];  Service: Vascular;  Laterality: Left;  . HOLMIUM LASER APPLICATION Right 11/04/5463   Procedure: HOLMIUM LASER APPLICATION;  Surgeon: Arvil Persons, MD;  Location: [facility];  Service: Urology;  Laterality: Right;  . KIDNEY STONE SURGERY  April 2015   1-2 stones  . LAPAROSCOPIC GASTRIC BANDING  05-29-2005  . LITHOTRIPSY  2-3 times  . LOWER EXTREMITY ANGIOGRAM N/A 06/04/2014   Procedure: LOWER EXTREMITY ANGIOGRAM;  Surgeon: Serafina Mitchell, MD;  Location: [facility];  Service: Cardiovascular;  Laterality: N/A;  . ORIF FIFTH METACARPAL [place]  RIGHT HAND  04-21-2002  . REVISION AND RE-SITING LAP-BAND PORT  04-08-2010   W/  UPPER EGD  . RIGHT KNEE PATELLECTOMY W/ REPAIR OF EXTENSOR MECHANISM  04-14-2002  . Toenail removed Left Jan. 21, 2016   2nd toenail-  Dr. Barkley Bruns   Social History   Social History  . Marital status: Single    Spouse name: N/A  . Number of children: N/A  . Years of education: N/A   Occupational History  . tester [employer]    Social History Main Topics  . Smoking status: Former Smoker    Packs/day: 1.00    Years: 28.00    Types: Cigarettes    Quit date: 09/05/2001  . Smokeless tobacco: Never Used  . Alcohol use No  . Drug use: No  . Sexual activity: Yes   Other Topics Concern  . Not on file   Social History Narrative   Regular exercise- yes   Current Outpatient Prescriptions on File Prior to Visit  Medication Sig Dispense Refill  . aspirin EC 325 MG tablet Take 325 mg by mouth daily.    . clopidogrel (PLAVIX) 75 MG tablet TAKE ONE TABLET BY MOUTH ONCE DAILY 30 tablet 11  . ezetimibe (ZETIA) 10 MG tablet Take 1 tablet (10 mg total) by mouth daily. 90 tablet 3  . glimepiride (AMARYL) 2 MG tablet Take 1 tablet (2 mg total) by mouth daily with breakfast. 90 tablet 1  . insulin aspart (NOVOLOG FLEXPEN) 100 UNIT/ML FlexPen Inject 10 Units into  the skin 3 (three) times daily with meals. 15 mL 11  . Insulin Detemir (LEVEMIR FLEXTOUCH) 100 UNIT/ML Pen Inject 120 Units into the skin daily at 10 pm. DX 250.72 30 mL 3  . Insulin Pen Needle (NOVOFINE PLUS) 32G X 4 MM MISC Use to inject insulin twice daily. DX E11.9 200 each 2  . losartan-hydrochlorothiazide (HYZAAR) 100-12.5 MG tablet Take 1 tablet by mouth daily. 90 tablet 1  . magnesium oxide (MAG-OX) 400 MG tablet Take 1 tablet (400 mg total) by mouth daily. 90 tablet 1  . metFORMIN (GLUCOPHAGE) 1000 MG tablet TAKE ONE TABLET BY MOUTH TWICE DAILY WITH MEALS 180 tablet 1  . pravastatin (PRAVACHOL) 40 MG tablet TAKE ONE TABLET BY MOUTH ONCE DAILY 90 tablet 3  . lubiprostone (AMITIZA) 24 MCG capsule Take 1 capsule (24 mcg total) by mouth 2 (two) times daily with a meal. (Patient not taking: Reported on 08/29/2016) 180 capsule 3   No current facility-administered medications on file prior to visit.    Allergies  Allergen Reactions  . Amlodipine Swelling  . Atorvastatin Other (See Comments)    Reaction:  Muscle aches   . Ampicillin Nausea And Vomiting and Other  (See Comments)      . Codeine Nausea And Vomiting    Ask patient  . Lisinopril Cough       . Penicillins Nausea And Vomiting and Other (See Comments)      . Januvia [Sitagliptin] Nausea And Vomiting   Family History  Problem Relation Age of Onset  . Diabetes Father   . Heart disease Father   . Deep vein thrombosis Father   . Hyperlipidemia Father   . Diabetes Sister   . Heart disease Sister   . Deep vein thrombosis Sister   . Hyperlipidemia Sister   . Diabetes Brother   . Heart disease Brother   . Cancer Brother   . Hyperlipidemia Brother   . Colon cancer Maternal Aunt   . Diabetes Brother   . Diabetes Brother   . Kidney disease Mother   . Hyperlipidemia Mother   . Other Mother     AAA   and    Amputation  . Arthritis Other   . Cancer Other     colon  . Hypertension Other   . Stroke Other     PE: BP 118/62 (BP Location: Left Arm, Patient Position: Sitting)   Pulse (!) 56   Ht 5\' 5"  (1.651 m)   Wt 183 lb (83 kg)   SpO2 98%   BMI 30.45 kg/m  Wt Readings from Last 3 Encounters:  08/29/16 183 lb (83 kg)  06/27/16 182 lb (82.6 kg)  06/05/16 181 lb 4 oz (82.2 kg)   Constitutional: obese, in NAD Eyes: PERRLA, EOMI, no exophthalmos ENT: moist mucous membranes, no thyromegaly, no cervical lymphadenopathy Cardiovascular: RRR, No MRG Respiratory: CTA B Gastrointestinal: abdomen soft, NT, ND, BS+ Musculoskeletal: no deformities, strength intact in all 4 Skin: moist, warm, no rashes Neurological: no tremor with outstretched hands, DTR normal in all 4  ASSESSMENT: 1. DM2, insulin-dependent, uncontrolled, with complications - PAD  - s/p R 2nd toe amputation 2016  PLAN:  1. Patient with long-standing, uncontrolled diabetes, on oral antidiabetic regimen, which became insufficient. HbA1c 9.3% (slightly better). She is on a high-dose of basal insulin and barely any mealtime insulin. She is occasionally taking 10 units of NovoLog. We discussed that a good regimen would  have approximately equal doses of long and short-acting insulin per  day. Therefore, we will decrease Levemir by 30 units and we will add NovoLog before every meal, with different doses depending on the size of her meal. She is insisting that she barely eats, but I advised her to take this every time she eats. We will stop Amaryl. As she is taking metformin at bedtime, we will move this dose with dinner. - We discussed at length about diet And I made suggestions to improve her meals. She is now on high-fat diet which I advised her to change to low-fat medium carb diet. I also gave her references and explained the benefits of the diet. - I suggested to:  Patient Instructions  Please decrease Levemir to 80 units at bedtime  Please increase Novolog as follows: - 14 units before a smaller meal - 18 units before a larger meals  Please continue Metformin 1000 mg 2x a day with meals.  Stop Amaryl.  Please let me know if the sugars are consistently <80 or >200.  Please come back for a follow-up appointment in 3 months.  Read the following books: Dr. Alyssa Grove - Program for Reversing Diabetes Dr. Karl Luke - Prevent and Reverse Heart Disease Dr. Alden Benjamin - How Not to Die  - Strongly advised her to start checking sugars at different times of the day - check 3 times a day, rotating checks - given sugar log and advised how to fill it and to bring it at next appt  - given foot care handout and explained the principles  - given instructions for hypoglycemia management "15-15 rule"  - advised for yearly eye exams  - Return to clinic in 3 mo with sugar log   Philemon Kingdom, MD PhD [facility] Endocrinology

## 2016-08-30 ENCOUNTER — Other Ambulatory Visit: Payer: Self-pay

## 2016-08-30 LAB — POCT GLYCOSYLATED HEMOGLOBIN (HGB A1C): Hemoglobin A1C: 9.3

## 2016-08-30 MED ORDER — ONETOUCH LANCETS MISC: 5 refills | Status: DC

## 2016-08-30 MED ORDER — GLUCOSE BLOOD VI STRP: ORAL_STRIP | 5 refills | Status: DC

## 2016-08-30 MED ORDER — ONETOUCH VERIO IQ SYSTEM W/DEVICE KIT: PACK | 0 refills | Status: DC

## 2016-08-30 NOTE — Addendum Note (Signed)
Addended by: Caprice Beaver T on: 08/30/2016 08:59 AM   Modules accepted: Orders

## 2016-08-30 NOTE — Telephone Encounter (Signed)
Change from [place] to [place] as that is what Pharmacy stated insurance preferred.

## 2016-09-12 ENCOUNTER — Ambulatory Visit: Payer: PPO | Admitting: Nurse Practitioner

## 2016-09-12 DIAGNOSIS — M79675 Pain in left toe(s): Secondary | ICD-10-CM | POA: Diagnosis not present

## 2016-09-12 DIAGNOSIS — E1151 Type 2 diabetes mellitus with diabetic peripheral angiopathy without gangrene: Secondary | ICD-10-CM | POA: Diagnosis not present

## 2016-09-13 ENCOUNTER — Encounter: Payer: Self-pay | Admitting: Gynecology

## 2016-09-13 ENCOUNTER — Ambulatory Visit (INDEPENDENT_AMBULATORY_CARE_PROVIDER_SITE_OTHER): Payer: PPO | Admitting: Nurse Practitioner

## 2016-09-13 ENCOUNTER — Encounter: Payer: Self-pay | Admitting: Nurse Practitioner

## 2016-09-13 VITALS — BP 92/50 | HR 54 | Temp 98.5°F | Ht 65.0 in | Wt 181.0 lb

## 2016-09-13 DIAGNOSIS — E538 Deficiency of other specified B group vitamins: Secondary | ICD-10-CM

## 2016-09-13 DIAGNOSIS — J0100 Acute maxillary sinusitis, unspecified: Secondary | ICD-10-CM

## 2016-09-13 MED ORDER — AZITHROMYCIN 250 MG PO TABS: 250.0000 mg | ORAL_TABLET | Freq: Every day | ORAL | 0 refills | Status: DC

## 2016-09-13 MED ORDER — CYANOCOBALAMIN 1000 MCG/ML IJ SOLN
1000.0000 ug | Freq: Once | INTRAMUSCULAR | Status: AC
  Administered 2016-09-13: 1000 ug via INTRAMUSCULAR

## 2016-09-13 MED ORDER — FLUTICASONE PROPIONATE 50 MCG/ACT NA SUSP: 2.0000 | Freq: Every day | NASAL | 0 refills | Status: DC

## 2016-09-13 MED ORDER — OXYMETAZOLINE HCL 0.05 % NA SOLN: 1.0000 | Freq: Two times a day (BID) | NASAL | 0 refills | Status: DC

## 2016-09-13 NOTE — Patient Instructions (Signed)
URI Instructions: Flonase and Afrin use: apply 1spray of afrin in each nare, wait 38mins, then apply 2sprays of flonase in each nare. Use both nasal spray consecutively x 3days, then flonase only for at least 14days.  Encourage adequate oral hydration.

## 2016-09-13 NOTE — Progress Notes (Signed)
Subjective:  Patient ID: Theresa Harrington, female    DOB: 1956-01-25  Age: 61 y.o. MRN: [id number]  CC: Sinus Problem (hard to breath,congestion going on for 4 days. req B12 inj?)   Sinus Problem  This is a recurrent problem. The current episode started more than 1 month ago. The problem has been waxing and waning since onset. There has been no fever. Associated symptoms include congestion, headaches, a hoarse voice, sinus pressure and a sore throat. Pertinent negatives include no chills, coughing, diaphoresis, ear pain, neck pain or shortness of breath. Treatments tried: antihistamine. The treatment provided no relief.   Outpatient Medications Prior to Visit  Medication Sig Dispense Refill  . aspirin EC 325 MG tablet Take 325 mg by mouth daily.    . Blood Glucose Monitoring Suppl (ONETOUCH VERIO IQ SYSTEM) w/Device KIT Use to check sugar daily 1 kit 0  . clopidogrel (PLAVIX) 75 MG tablet TAKE ONE TABLET BY MOUTH ONCE DAILY 30 tablet 11  . ezetimibe (ZETIA) 10 MG tablet Take 1 tablet (10 mg total) by mouth daily. 90 tablet 3  . glucose blood (ONETOUCH VERIO) test strip Use as instructed to check sugar 3 times daily 30 each 5  . insulin aspart (NOVOLOG FLEXPEN) 100 UNIT/ML FlexPen Inject 10 Units into the skin 3 (three) times daily with meals. 15 mL 11  . Insulin Detemir (LEVEMIR FLEXTOUCH) 100 UNIT/ML Pen Inject 120 Units into the skin daily at 10 pm. DX 250.72 30 mL 3  . Insulin Pen Needle (NOVOFINE PLUS) 32G X 4 MM MISC Use to inject insulin twice daily. DX E11.9 200 each 2  . losartan-hydrochlorothiazide (HYZAAR) 100-12.5 MG tablet Take 1 tablet by mouth daily. 90 tablet 1  . magnesium oxide (MAG-OX) 400 MG tablet Take 1 tablet (400 mg total) by mouth daily. 90 tablet 1  . metFORMIN (GLUCOPHAGE) 1000 MG tablet TAKE ONE TABLET BY MOUTH TWICE DAILY WITH MEALS 180 tablet 1  . ONE TOUCH LANCETS MISC Use to check sugar 3 times daily 300 each 5  . pravastatin (PRAVACHOL) 40 MG tablet TAKE ONE TABLET  BY MOUTH ONCE DAILY 90 tablet 3  . glimepiride (AMARYL) 2 MG tablet Take 1 tablet (2 mg total) by mouth daily with breakfast. (Patient not taking: Reported on 09/13/2016) 90 tablet 1  . lubiprostone (AMITIZA) 24 MCG capsule Take 1 capsule (24 mcg total) by mouth 2 (two) times daily with a meal. (Patient not taking: Reported on 08/29/2016) 180 capsule 3   No facility-administered medications prior to visit.     ROS See HPI  Objective:  BP (!) 92/50   Pulse (!) 54   Temp 98.5 F (36.9 C)   Ht _0  (1.651 m)   Wt 181 lb (82.1 kg)   SpO2 98%   BMI 30.12 kg/m   BP Readings from Last 3 Encounters:  09/13/16 (!) 92/50  08/29/16 118/62  06/27/16 112/72    Wt Readings from Last 3 Encounters:  09/13/16 181 lb (82.1 kg)  08/29/16 183 lb (83 kg)  06/27/16 182 lb (82.6 kg)    Physical Exam  Constitutional: She is oriented to person, place, and time.  HENT:  Right Ear: Tympanic membrane, external ear and ear canal normal.  Left Ear: Tympanic membrane, external ear and ear canal normal.  Nose: Mucosal edema and rhinorrhea present. Right sinus exhibits maxillary sinus tenderness. Right sinus exhibits no frontal sinus tenderness. Left sinus exhibits maxillary sinus tenderness. Left sinus exhibits no frontal sinus tenderness.  Mouth/Throat:  Uvula is midline. No trismus in the jaw. Posterior oropharyngeal erythema present. No oropharyngeal exudate.  Eyes: No scleral icterus.  Neck: Normal range of motion. Neck supple.  Cardiovascular: Normal rate and normal heart sounds.   Pulmonary/Chest: Effort normal and breath sounds normal.  Musculoskeletal: She exhibits no edema.  Lymphadenopathy:    She has cervical adenopathy.  Neurological: She is alert and oriented to person, place, and time.  Vitals reviewed.   Lab Results  Component Value Date   WBC 11.1 Repeated and verified X2. (H) 06/05/2016   HGB 12.0 06/05/2016   HCT 36.8 06/05/2016   PLT 272.0 06/05/2016   GLUCOSE 234 (H)  06/05/2016   CHOL 143 02/29/2016   TRIG 127.0 02/29/2016   HDL 48.60 02/29/2016   LDLDIRECT 160.2 03/01/2010   LDLCALC 69 02/29/2016   ALT 10 06/05/2016   AST 10 06/05/2016   NA 136 06/05/2016   K 4.0 06/05/2016   CL 102 06/05/2016   CREATININE 0.76 06/05/2016   BUN 21 06/05/2016   CO2 26 06/05/2016   TSH 0.79 02/29/2016   INR 1.05 07/16/2014   HGBA1C 9.3 08/30/2016   MICROALBUR 2.7 (H) 02/29/2016    Mm Screening Breast Tomo Bilateral  Result Date: 05/26/2016 CLINICAL DATA:  Screening. History of bilateral breast lift surgery. EXAM: 2D DIGITAL SCREENING BILATERAL MAMMOGRAM WITH CAD AND ADJUNCT TOMO COMPARISON:  Previous exam(s). ACR Breast Density Category b: There are scattered areas of fibroglandular density. FINDINGS: There are stable postsurgical changes within each breast. There are no findings suspicious for malignancy within either breast. Images were processed with CAD. IMPRESSION: No mammographic evidence of malignancy. A result letter of this screening mammogram will be mailed directly to the patient. RECOMMENDATION: Screening mammogram in one year. (Code:SM-B-01Y) BI-RADS CATEGORY  2: Benign. Electronically Signed   By: Franki Cabot M.D.   On: 05/26/2016 13:04    Assessment & Plan:   Theresa Harrington was seen today for sinus problem.  Diagnoses and all orders for this visit:  Acute non-recurrent maxillary sinusitis -     fluticasone (FLONASE) 50 MCG/ACT nasal spray; Place 2 sprays into both nostrils daily. -     oxymetazoline (AFRIN NASAL SPRAY) 0.05 % nasal spray; Place 1 spray into both nostrils 2 (two) times daily. Use only for 3days, then stop -     azithromycin (ZITHROMAX Z-PAK) 250 MG tablet; Take 1 tablet (250 mg total) by mouth daily. Take 2tabs on first day, then 1tab once a day till complete  B12 deficiency -     cyanocobalamin ((VITAMIN B-12)) injection 1,000 mcg; Inject 1 mL (1,000 mcg total) into the muscle once.   I am having Theresa Harrington start on fluticasone,  oxymetazoline, and azithromycin. I am also having her maintain her aspirin EC, insulin aspart, Insulin Pen Needle, clopidogrel, ezetimibe, Insulin Detemir, magnesium oxide, losartan-hydrochlorothiazide, lubiprostone, glimepiride, metFORMIN, pravastatin, ONETOUCH VERIO IQ SYSTEM, glucose blood, and ONE TOUCH LANCETS. We administered cyanocobalamin.  Meds ordered this encounter  Medications  . cyanocobalamin ((VITAMIN B-12)) injection 1,000 mcg  . fluticasone (FLONASE) 50 MCG/ACT nasal spray    Sig: Place 2 sprays into both nostrils daily.    Dispense:  16 g    Refill:  0    Order Specific Question:   Supervising Provider    Answer:   Cassandria Anger [[id number]]  . oxymetazoline (AFRIN NASAL SPRAY) 0.05 % nasal spray    Sig: Place 1 spray into both nostrils 2 (two) times daily. Use only for 3days, then stop  Dispense:  30 mL    Refill:  0    Order Specific Question:   Supervising Provider    Answer:   Cassandria Anger [[id number]]  . azithromycin (ZITHROMAX Z-PAK) 250 MG tablet    Sig: Take 1 tablet (250 mg total) by mouth daily. Take 2tabs on first day, then 1tab once a day till complete    Dispense:  6 tablet    Refill:  0    Order Specific Question:   Supervising Provider    Answer:   Cassandria Anger [[id number]]   Follow-up: Return if symptoms worsen or fail to improve.  Wilfred Lacy, NP

## 2016-09-20 DIAGNOSIS — D3131 Benign neoplasm of right choroid: Secondary | ICD-10-CM | POA: Diagnosis not present

## 2016-09-20 DIAGNOSIS — H35412 Lattice degeneration of retina, left eye: Secondary | ICD-10-CM | POA: Diagnosis not present

## 2016-09-20 DIAGNOSIS — E119 Type 2 diabetes mellitus without complications: Secondary | ICD-10-CM | POA: Diagnosis not present

## 2016-09-20 DIAGNOSIS — H401121 Primary open-angle glaucoma, left eye, mild stage: Secondary | ICD-10-CM | POA: Diagnosis not present

## 2016-09-20 DIAGNOSIS — H26493 Other secondary cataract, bilateral: Secondary | ICD-10-CM | POA: Diagnosis not present

## 2016-09-20 DIAGNOSIS — H401112 Primary open-angle glaucoma, right eye, moderate stage: Secondary | ICD-10-CM | POA: Diagnosis not present

## 2016-09-20 DIAGNOSIS — Z961 Presence of intraocular lens: Secondary | ICD-10-CM | POA: Diagnosis not present

## 2016-09-20 LAB — HM DIABETES EYE EXAM

## 2016-10-02 ENCOUNTER — Other Ambulatory Visit: Payer: Self-pay

## 2016-10-02 MED ORDER — GLUCOSE BLOOD VI STRP: ORAL_STRIP | 5 refills | Status: DC

## 2016-10-24 ENCOUNTER — Encounter: Payer: Self-pay | Admitting: Internal Medicine

## 2016-10-24 NOTE — Progress Notes (Unsigned)
Results entered and sent to scan  

## 2016-10-25 ENCOUNTER — Other Ambulatory Visit: Payer: Self-pay | Admitting: Internal Medicine

## 2016-10-25 DIAGNOSIS — E1151 Type 2 diabetes mellitus with diabetic peripheral angiopathy without gangrene: Secondary | ICD-10-CM

## 2016-11-02 ENCOUNTER — Telehealth: Payer: Self-pay

## 2016-11-02 NOTE — Telephone Encounter (Signed)
[email or web address]

## 2016-11-08 ENCOUNTER — Encounter: Payer: Self-pay | Admitting: Internal Medicine

## 2016-11-08 ENCOUNTER — Ambulatory Visit (INDEPENDENT_AMBULATORY_CARE_PROVIDER_SITE_OTHER): Payer: PPO | Admitting: Internal Medicine

## 2016-11-08 VITALS — BP 122/60 | HR 66 | Temp 97.9°F | Resp 16 | Ht 65.0 in | Wt 182.0 lb

## 2016-11-08 DIAGNOSIS — E1151 Type 2 diabetes mellitus with diabetic peripheral angiopathy without gangrene: Secondary | ICD-10-CM

## 2016-11-08 DIAGNOSIS — E538 Deficiency of other specified B group vitamins: Secondary | ICD-10-CM | POA: Diagnosis not present

## 2016-11-08 DIAGNOSIS — I10 Essential (primary) hypertension: Secondary | ICD-10-CM | POA: Diagnosis not present

## 2016-11-08 DIAGNOSIS — R000 Tachycardia, unspecified: Secondary | ICD-10-CM | POA: Diagnosis not present

## 2016-11-08 DIAGNOSIS — I4891 Unspecified atrial fibrillation: Secondary | ICD-10-CM | POA: Insufficient documentation

## 2016-11-08 DIAGNOSIS — IMO0002 Reserved for concepts with insufficient information to code with codable children: Secondary | ICD-10-CM

## 2016-11-08 DIAGNOSIS — E1165 Type 2 diabetes mellitus with hyperglycemia: Secondary | ICD-10-CM

## 2016-11-08 LAB — POCT GLYCOSYLATED HEMOGLOBIN (HGB A1C): Hemoglobin A1C: 9.9

## 2016-11-08 LAB — POCT GLUCOSE (DEVICE FOR HOME USE): Glucose Fasting, POC: 89 mg/dL (ref 70–99)

## 2016-11-08 MED ORDER — CYANOCOBALAMIN 1000 MCG/ML IJ SOLN
1000.0000 ug | Freq: Once | INTRAMUSCULAR | Status: AC
  Administered 2016-11-08: 1000 ug via INTRAMUSCULAR

## 2016-11-08 NOTE — Progress Notes (Signed)
Subjective:  Patient ID: Theresa Harrington, female    DOB: [birth year]  Age: 61 y.o. MRN: [id number]  CC: Diabetes   HPI Theresa Harrington presents for f/up - She has recently had a few blood sugars that were >400 after eating some sweats. Over the last month she has also had a few episodes of dizziness and orthostasis. She denies chest pain, palpitations, shortness of breath, edema, or fatigue.  Outpatient Medications Prior to Visit  Medication Sig Dispense Refill  . aspirin EC 325 MG tablet Take 325 mg by mouth daily.    . Blood Glucose Monitoring Suppl (ONETOUCH VERIO IQ SYSTEM) w/Device KIT Use to check sugar daily 1 kit 0  . ezetimibe (ZETIA) 10 MG tablet Take 1 tablet (10 mg total) by mouth daily. 90 tablet 3  . fluticasone (FLONASE) 50 MCG/ACT nasal spray Place 2 sprays into both nostrils daily. 16 g 0  . glimepiride (AMARYL) 2 MG tablet Take 1 tablet (2 mg total) by mouth daily with breakfast. 90 tablet 1  . glucose blood (ONETOUCH VERIO) test strip Use as instructed to check sugar 3 times daily 30 each 5  . HUMALOG KWIKPEN 200 UNIT/ML SOPN INJECT 10 UNITS SUBCUTANEOUSLY THREE TIMES DAILY WITH MEALS AS NEEDED 15 mL 0  . insulin aspart (NOVOLOG FLEXPEN) 100 UNIT/ML FlexPen Inject 10 Units into the skin 3 (three) times daily with meals. 15 mL 11  . Insulin Detemir (LEVEMIR FLEXTOUCH) 100 UNIT/ML Pen Inject 120 Units into the skin daily at 10 pm. DX 250.72 30 mL 3  . Insulin Pen Needle (NOVOFINE PLUS) 32G X 4 MM MISC Use to inject insulin twice daily. DX E11.9 200 each 2  . losartan-hydrochlorothiazide (HYZAAR) 100-12.5 MG tablet Take 1 tablet by mouth daily. 90 tablet 1  . lubiprostone (AMITIZA) 24 MCG capsule Take 1 capsule (24 mcg total) by mouth 2 (two) times daily with a meal. 180 capsule 3  . magnesium oxide (MAG-OX) 400 MG tablet Take 1 tablet (400 mg total) by mouth daily. 90 tablet 1  . metFORMIN (GLUCOPHAGE) 1000 MG tablet TAKE ONE TABLET BY MOUTH TWICE DAILY WITH MEALS 180 tablet 1  .  ONE TOUCH LANCETS MISC Use to check sugar 3 times daily 300 each 5  . pravastatin (PRAVACHOL) 40 MG tablet TAKE ONE TABLET BY MOUTH ONCE DAILY 90 tablet 3  . clopidogrel (PLAVIX) 75 MG tablet TAKE ONE TABLET BY MOUTH ONCE DAILY 30 tablet 11  . oxymetazoline (AFRIN NASAL SPRAY) 0.05 % nasal spray Place 1 spray into both nostrils 2 (two) times daily. Use only for 3days, then stop 30 mL 0  . azithromycin (ZITHROMAX Z-PAK) 250 MG tablet Take 1 tablet (250 mg total) by mouth daily. Take 2tabs on first day, then 1tab once a day till complete 6 tablet 0   No facility-administered medications prior to visit.     ROS Review of Systems  Constitutional: Negative.  Negative for appetite change, chills, diaphoresis, fatigue and fever.  HENT: Negative.   Eyes: Negative for visual disturbance.  Respiratory: Negative.  Negative for cough, chest tightness, shortness of breath and wheezing.   Cardiovascular: Negative for chest pain, palpitations and leg swelling.  Gastrointestinal: Negative for abdominal pain, constipation, diarrhea, nausea and vomiting.  Endocrine: Negative.  Negative for polydipsia, polyphagia and polyuria.  Genitourinary: Negative.  Negative for decreased urine volume, difficulty urinating, dysuria, hematuria and urgency.  Musculoskeletal: Negative.  Negative for myalgias.  Skin: Negative.   Neurological: Positive for dizziness and light-headedness. Negative for  syncope and weakness.  Hematological: Negative for adenopathy. Does not bruise/bleed easily.  Psychiatric/Behavioral: Negative.     Objective:  BP 122/60 (BP Location: Left Arm, Patient Position: Sitting, Cuff Size: Normal)   Pulse 66   Temp 97.9 F (36.6 C) (Oral)   Resp 16   Ht '5\' 5"'  (1.651 m)   Wt 182 lb (82.6 kg)   SpO2 100%   BMI 30.29 kg/m   BP Readings from Last 3 Encounters:  11/08/16 122/60  09/13/16 (!) 92/50  08/29/16 118/62    Wt Readings from Last 3 Encounters:  11/08/16 182 lb (82.6 kg)  09/13/16  181 lb (82.1 kg)  08/29/16 183 lb (83 kg)    Physical Exam  Constitutional: She is oriented to person, place, and time. No distress.  HENT:  Mouth/Throat: Oropharynx is clear and moist. No oropharyngeal exudate.  Eyes: Conjunctivae are normal. Right eye exhibits no discharge. Left eye exhibits no discharge. No scleral icterus.  Neck: Normal range of motion. Neck supple. No JVD present. No thyromegaly present.  Cardiovascular: An irregularly irregular rhythm present. Tachycardia present.  PMI is not displaced.  Exam reveals no gallop and no friction rub.   No murmur heard. EKG-- Atrial fibrillation  Low voltage in limb leads.   ABNORMAL - A fib is a new finding   Pulmonary/Chest: Effort normal and breath sounds normal. No respiratory distress. She has no wheezes. She has no rales. She exhibits no tenderness.  Abdominal: Soft. Bowel sounds are normal. She exhibits no distension and no mass. There is no tenderness. There is no rebound and no guarding.  Musculoskeletal: Normal range of motion. She exhibits no edema, tenderness or deformity.  Neurological: She is alert and oriented to person, place, and time.  Skin: Skin is warm and dry. No rash noted. She is not diaphoretic. No erythema. No pallor.  Psychiatric: She has a normal mood and affect. Her behavior is normal. Judgment and thought content normal.  Vitals reviewed.   Lab Results  Component Value Date   WBC 11.1 Repeated and verified X2. (H) 06/05/2016   HGB 12.0 06/05/2016   HCT 36.8 06/05/2016   PLT 272.0 06/05/2016   GLUCOSE 234 (H) 06/05/2016   CHOL 143 02/29/2016   TRIG 127.0 02/29/2016   HDL 48.60 02/29/2016   LDLDIRECT 160.2 03/01/2010   LDLCALC 69 02/29/2016   ALT 10 06/05/2016   AST 10 06/05/2016   NA 136 06/05/2016   K 4.0 06/05/2016   CL 102 06/05/2016   CREATININE 0.76 06/05/2016   BUN 21 06/05/2016   CO2 26 06/05/2016   TSH 0.79 02/29/2016   INR 1.05 07/16/2014   HGBA1C 9.9 11/08/2016   MICROALBUR 2.7  (H) 02/29/2016    Mm Screening Breast Tomo Bilateral  Result Date: 05/26/2016 CLINICAL DATA:  Screening. History of bilateral breast lift surgery. EXAM: 2D DIGITAL SCREENING BILATERAL MAMMOGRAM WITH CAD AND ADJUNCT TOMO COMPARISON:  Previous exam(s). ACR Breast Density Category b: There are scattered areas of fibroglandular density. FINDINGS: There are stable postsurgical changes within each breast. There are no findings suspicious for malignancy within either breast. Images were processed with CAD. IMPRESSION: No mammographic evidence of malignancy. A result letter of this screening mammogram will be mailed directly to the patient. RECOMMENDATION: Screening mammogram in one year. (Code:[id number]-B-01Y) BI-RADS CATEGORY  2: Benign. Electronically Signed   By: Franki Cabot M.D.   On: 05/26/2016 13:04    Assessment & Plan:   [place] was seen today for diabetes.  Diagnoses  and all orders for this visit:  Uncontrolled type 2 diabetes mellitus with peripheral artery disease ([place])- her A1c is up to 9.9%, I've asked her to follow-up with her endocrinologist. -     POCT glycosylated hemoglobin (Hb A1C) -     POCT Glucose (Device for Home Use) -     Ambulatory referral to Endocrinology  Essential hypertension, benign- her blood pressure is adequately well controlled.  Increased pulse rate- her EKG shows new onset A. fib with rapid ventricular response. -     EKG 12-Lead  B12 deficiency -     cyanocobalamin ((VITAMIN B-12)) injection 1,000 mcg; Inject 1 mL (1,000 mcg total) into the muscle once.  Atrial fibrillation with RVR ([place])- will start anticoagulation with Xarelto to reduce her risk of CVA, for now will hold the clopidogrel to try to avoid excessive anticoagulation and bleeding complications, I've asked her to follow-up with cardiology to consider treatment options for rate and rhythm control. -     Ambulatory referral to Cardiology   I have discontinued Ms. Schnider's clopidogrel, oxymetazoline, and  azithromycin. I am also having her maintain her aspirin EC, insulin aspart, Insulin Pen Needle, ezetimibe, Insulin Detemir, magnesium oxide, losartan-hydrochlorothiazide, lubiprostone, glimepiride, metFORMIN, pravastatin, ONETOUCH VERIO IQ SYSTEM, ONE TOUCH LANCETS, fluticasone, glucose blood, and HUMALOG KWIKPEN. We administered cyanocobalamin.  Meds ordered this encounter  Medications  . cyanocobalamin ((VITAMIN B-12)) injection 1,000 mcg     Follow-up: Return in about 4 weeks (around 12/06/2016).  Theresa Calico, MD

## 2016-11-08 NOTE — Patient Instructions (Signed)
Atrial Fibrillation Atrial fibrillation is a type of irregular or rapid heartbeat (arrhythmia). In atrial fibrillation, the heart quivers continuously in a chaotic pattern. This occurs when parts of the heart receive disorganized signals that make the heart unable to pump blood normally. This can increase the risk for stroke, heart failure, and other heart-related conditions. There are different types of atrial fibrillation, including:  Paroxysmal atrial fibrillation. This type starts suddenly, and it usually stops on its own shortly after it starts.  Persistent atrial fibrillation. This type often lasts longer than a week. It may stop on its own or with treatment.  Long-lasting persistent atrial fibrillation. This type lasts longer than 12 months.  Permanent atrial fibrillation. This type does not go away.  Talk with your health care provider to learn about the type of atrial fibrillation that you have. What are the causes? This condition is caused by some heart-related conditions or procedures, including:  A heart attack.  Coronary artery disease.  Heart failure.  Heart valve conditions.  High blood pressure.  Inflammation of the sac that surrounds the heart (pericarditis).  Heart surgery.  Certain heart rhythm disorders, such as Wolf-Parkinson-White syndrome.  Other causes include:  Pneumonia.  Obstructive sleep apnea.  Blockage of an artery in the lungs (pulmonary embolism, or PE).  Lung cancer.  Chronic lung disease.  Thyroid problems, especially if the thyroid is overactive (hyperthyroidism).  Caffeine.  Excessive alcohol use or illegal drug use.  Use of some medicines, including certain decongestants and diet pills.  Sometimes, the cause cannot be found. What increases the risk? This condition is more likely to develop in:  People who are older in age.  People who smoke.  People who have diabetes mellitus.  People who are overweight  (obese).  Athletes who exercise vigorously.  What are the signs or symptoms? Symptoms of this condition include:  A feeling that your heart is beating rapidly or irregularly.  A feeling of discomfort or pain in your chest.  Shortness of breath.  Sudden light-headedness or weakness.  Getting tired easily during exercise.  In some cases, there are no symptoms. How is this diagnosed? Your health care provider may be able to detect atrial fibrillation when taking your pulse. If detected, this condition may be diagnosed with:  An electrocardiogram (ECG).  A Holter monitor test that records your heartbeat patterns over a 24-hour period.  Transthoracic echocardiogram (TTE) to evaluate how blood flows through your heart.  Transesophageal echocardiogram (TEE) to view more detailed images of your heart.  A stress test.  Imaging tests, such as a CT scan or chest X-ray.  Blood tests.  How is this treated? The main goals of treatment are to prevent blood clots from forming and to keep your heart beating at a normal rate and rhythm. The type of treatment that you receive depends on many factors, such as your underlying medical conditions and how you feel when you are experiencing atrial fibrillation. This condition may be treated with:  Medicine to slow down the heart rate, bring the heart's rhythm back to normal, or prevent clots from forming.  Electrical cardioversion. This is a procedure that resets your heart's rhythm by delivering a controlled, low-energy shock to the heart through your skin.  Different types of ablation, such as catheter ablation, catheter ablation with pacemaker, or surgical ablation. These procedures destroy the heart tissues that send abnormal signals. When the pacemaker is used, it is placed under your skin to help your heart beat in   a regular rhythm.  Follow these instructions at home:  Take over-the counter and prescription medicines only as told by your  health care provider.  If your health care provider prescribed a blood-thinning medicine (anticoagulant), take it exactly as told. Taking too much blood-thinning medicine can cause bleeding. If you do not take enough blood-thinning medicine, you will not have the protection that you need against stroke and other problems.  Do not use tobacco products, including cigarettes, chewing tobacco, and e-cigarettes. If you need help quitting, ask your health care provider.  If you have obstructive sleep apnea, manage your condition as told by your health care provider.  Do not drink alcohol.  Do not drink beverages that contain caffeine, such as coffee, soda, and tea.  Maintain a healthy weight. Do not use diet pills unless your health care provider approves. Diet pills may make heart problems worse.  Follow diet instructions as told by your health care provider.  Exercise regularly as told by your health care provider.  Keep all follow-up visits as told by your health care provider. This is important. How is this prevented?  Avoid drinking beverages that contain caffeine or alcohol.  Avoid certain medicines, especially medicines that are used for breathing problems.  Avoid certain herbs and herbal medicines, such as those that contain ephedra or ginseng.  Do not use illegal drugs, such as cocaine and amphetamines.  Do not smoke.  Manage your high blood pressure. Contact a health care provider if:  You notice a change in the rate, rhythm, or strength of your heartbeat.  You are taking an anticoagulant and you notice increased bruising.  You tire more easily when you exercise or exert yourself. Get help right away if:  You have chest pain, abdominal pain, sweating, or weakness.  You feel nauseous.  You notice blood in your vomit, bowel movement, or urine.  You have shortness of breath.  You suddenly have swollen feet and ankles.  You feel dizzy.  You have sudden weakness or  numbness of the face, arm, or leg, especially on one side of the body.  You have trouble speaking, trouble understanding, or both (aphasia).  Your face or your eyelid droops on one side. These symptoms may represent a serious problem that is an emergency. Do not wait to see if the symptoms will go away. Get medical help right away. Call your local emergency services (911 in the [place] Do not drive yourself to the hospital. This information is not intended to replace advice given to you by your health care provider. Make sure you discuss any questions you have with your health care provider. Document Released: 04/17/2005 Document Revised: 08/25/2015 Document Reviewed: 08/12/2014 Elsevier Interactive Patient Education  2017 [employer]  

## 2016-11-09 ENCOUNTER — Telehealth: Payer: Self-pay | Admitting: Internal Medicine

## 2016-11-09 NOTE — Telephone Encounter (Signed)
Pt states she was given Xarelto samples yesterday and when she went to [facility] today her urine was dark red.  She did not have any pain when urianting but she would ike to know if this is a symptom of her taking the Xarelto. Please call back in regard

## 2016-11-09 NOTE — Telephone Encounter (Signed)
Yes, xarelto can do that If this continue then she will have to stop the xarelto

## 2016-11-09 NOTE — Telephone Encounter (Signed)
Pt informed of MD response.  

## 2016-11-13 ENCOUNTER — Ambulatory Visit (INDEPENDENT_AMBULATORY_CARE_PROVIDER_SITE_OTHER): Payer: PPO | Admitting: Cardiology

## 2016-11-13 ENCOUNTER — Encounter: Payer: Self-pay | Admitting: Cardiology

## 2016-11-13 ENCOUNTER — Encounter (INDEPENDENT_AMBULATORY_CARE_PROVIDER_SITE_OTHER): Payer: Self-pay

## 2016-11-13 VITALS — BP 102/62 | HR 72 | Ht 64.5 in | Wt 178.4 lb

## 2016-11-13 DIAGNOSIS — Z79899 Other long term (current) drug therapy: Secondary | ICD-10-CM

## 2016-11-13 DIAGNOSIS — E119 Type 2 diabetes mellitus without complications: Secondary | ICD-10-CM | POA: Diagnosis not present

## 2016-11-13 DIAGNOSIS — I480 Paroxysmal atrial fibrillation: Secondary | ICD-10-CM | POA: Diagnosis not present

## 2016-11-13 DIAGNOSIS — I10 Essential (primary) hypertension: Secondary | ICD-10-CM

## 2016-11-13 DIAGNOSIS — I739 Peripheral vascular disease, unspecified: Secondary | ICD-10-CM

## 2016-11-13 MED ORDER — RIVAROXABAN 20 MG PO TABS: 20.0000 mg | ORAL_TABLET | Freq: Every day | ORAL | 11 refills | Status: DC

## 2016-11-13 MED ORDER — DILTIAZEM HCL ER COATED BEADS 120 MG PO CP24: 120.0000 mg | ORAL_CAPSULE | Freq: Every day | ORAL | 11 refills | Status: DC

## 2016-11-13 NOTE — Progress Notes (Addendum)
Cardiology Office Note:    Date:  11/15/2016   ID:  Theresa Harrington, DOB [birth year], MRN [id number]  PCP:  [name], MD  Cardiologist:  [name], MD  Referring MD: [name], MD     History of Present Illness:    Theresa Harrington is a 61 y.o. female here for evaluation of atrial fibrillation at the request of Dr. [name]. She was started on Xarelto to reduce risk of stroke. Clopidogrel was stopped to avoid excessive bleeding complications. She has a history of diabetes, hypertension. She did place a phone call in because of some darkened during.  She has had a femoral-popliteal bypass on 06/08/14 by Dr. Sherren Mocha early and Trula Slade. This must be the reason why she had been on Plavix. Her EKG from 11/08/16 shows atrial fibrillation with heart rate of 134 bpm. No symptoms, no SOB, no CP, syncope. Bent over and felt a little light headed. No heart issue. No stroke   DM, PVD, HTN, F, no CVA  Today, she discussed her stroke family history which is quite extensive. She is worried about this. She wonders if this was inevitable.  She also stated that she had what feels like a fever break just the other day, some night sweats that have resolved.    Past Medical History:  Diagnosis Date  . Anemia   . Anxiety   . Asymptomatic cholelithiasis   . Atherosclerosis of aorta ([place])   . GERD (gastroesophageal reflux disease)   . Hypertension   . Hypothyroidism    no meds  . PAD (peripheral artery disease) ([employer])   . Peripheral arterial occlusive disease (HCC)    lower extremities  . Pneumonia   . PONV (postoperative nausea and vomiting)   . Right ureteral stone   . Type 2 diabetes mellitus ([place])   . Wears glasses     Past Surgical History:  Procedure Laterality Date  . AMPUTATION Left 07/16/2014   Procedure: LEFT SECOND TOE AMPUTATION;  Surgeon: Serafina Mitchell, MD;  Location: [employer] [facility];  Service: Vascular;  Laterality: Left;  With Nerve block  . AUGMENTATION MAMMAPLASTY    . BELPHAROPTOSIS  REPAIR     eyelid lift  . COMBINED AUGMENTATION MAMMAPLASTY AND ABDOMINOPLASTY  2009   W/  BILATERAL  THIGH LIFT  . CYSTO/  RIGHT URETERAL STENT PLACEMENT  12-30-2010  . CYSTOSCOPY WITH RETROGRADE PYELOGRAM, URETEROSCOPY AND STENT PLACEMENT Right 10/07/2013   Procedure: CYSTOSCOPY WITH RETROGRADE PYELOGRAM, right URETEROSCOPY AND STENT PLACEMENT, stone extraction;  Surgeon: Arvil Persons, MD;  Location: [facility];  Service: Urology;  Laterality: Right;  . CYSTOSCOPY WITH STENT PLACEMENT Right 06/04/2013   Procedure: CYSTOSCOPY WITH STENT PLACEMENT;  Surgeon: Franchot Gallo, MD;  Location[facility];  Service: Urology;  Laterality: Right;  . DILATATION & CURETTAGE/HYSTEROSCOPY WITH MYOSURE N/A 04/27/2015   Procedure: DILATATION & CURETTAGE/HYSTEROSCOPY WITH MYOSURE;  Surgeon: Terrance Mass, MD;  Location: [facility];  Service: Gynecology;  Laterality: N/A;  . ENDARTERECTOMY FEMORAL Left 06/08/2014   Procedure: Left Leg Common Femoral and External Iliac  Endartarectomy with patch Angioplasty;  Surgeon: Rosetta Posner, MD;  Location: [place];  Service: Vascular;  Laterality: Left;  . EXTRACORPOREAL SHOCK WAVE LITHOTRIPSY Right 08-04-2013//   06-23-2013//   01-16-2011  . FEMORAL-POPLITEAL BYPASS GRAFT Left 06/08/2014   Procedure: Left Leg Femoral -Popliteal Bypass Graft;  Surgeon: Rosetta Posner, MD;  Location: [place];  Service: Vascular;  Laterality: Left;  . FEMORAL-POPLITEAL BYPASS GRAFT Left  06/09/2014   Procedure: Left Femoral and Popliteal Exposure; Left Femoral to Anterior Tibial Bypass Graft using Propaten 75m by 80cm Goretex Graft; Left Tibial Endarterectomy; Left Femoraland Popliteal Thrombectomy ;  Surgeon: VSerafina Mitchell MD;  [place] [facility]  Service: Vascular;  Laterality: Left;  . HOLMIUM LASER APPLICATION Right 60/0/1749  Procedure: HOLMIUM LASER APPLICATION;  Surgeon: MArvil Persons MD;  [facility]  Service: Urology;  Laterality: Right;  . KIDNEY STONE SURGERY   April 2015   1-2 stones  . LAPAROSCOPIC GASTRIC BANDING  05-29-2005  . LITHOTRIPSY  2-3 times  . LOWER EXTREMITY ANGIOGRAM N/A 06/04/2014   Procedure: LOWER EXTREMITY ANGIOGRAM;  Surgeon: VSerafina Mitchell MD;  [facility];  Service: Cardiovascular;  Laterality: N/A;  . [place]  04-21-2002  . REVISION AND RE-SITING LAP-BAND PORT  04-08-2010   W/  UPPER EGD  . RIGHT KNEE PATELLECTOMY W/ REPAIR OF EXTENSOR MECHANISM  04-14-2002  . Toenail removed Left Jan. 21, 2016   2nd toenail-  Dr. PBarkley Bruns   Current Medications: Current Meds  Medication Sig  . ezetimibe (ZETIA) 10 MG tablet Take 1 tablet (10 mg total) by mouth daily.  . insulin aspart (NOVOLOG FLEXPEN) 100 UNIT/ML FlexPen Inject 10 Units into the skin 3 (three) times daily with meals.  . Insulin Detemir (LEVEMIR FLEXTOUCH) 100 UNIT/ML Pen Inject 120 Units into the skin daily at 10 pm. DX 250.72  . losartan-hydrochlorothiazide (HYZAAR) 100-12.5 MG tablet Take 1 tablet by mouth daily.  . magnesium oxide (MAG-OX) 400 MG tablet Take 1 tablet (400 mg total) by mouth daily.  . metFORMIN (GLUCOPHAGE) 1000 MG tablet TAKE ONE TABLET BY MOUTH TWICE DAILY WITH MEALS  . pravastatin (PRAVACHOL) 40 MG tablet TAKE ONE TABLET BY MOUTH ONCE DAILY  . [DISCONTINUED] aspirin EC 325 MG tablet Take 325 mg by mouth daily.     Allergies:   Amlodipine; Atorvastatin; Ampicillin; Codeine; Lisinopril; Penicillins; and Januvia [sitagliptin]   Social History   Social History  . Marital status: Single    Spouse name: N/A  . Number of children: N/A  . 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 Smoking status: Former Smoker    Packs/day: 1.00    Years: 28.00    Types: Cigarettes    Quit date: 09/05/2001  . Smokeless tobacco: Never Used  . Alcohol use No  . Drug use: No  . Sexual activity: Yes   Other Topics Concern  . None   Social History  Narrative   Regular exercise- yes     Family History: The patient's family history includes Arthritis in her other; Cancer in her brother and other; Colon cancer in her maternal aunt; Deep vein thrombosis in her father and sister; Diabetes in her brother, brother, brother, father, and sister; Heart disease in her brother, father, and sister; Hyperlipidemia in her brother, father, mother, and sister; Hypertension in her other; Kidney disease in her mother; Other in her mother; Stroke in her other. ROS:   Please see the history of present illness.     All other systems reviewed and are negative.  EKGs/Labs/Other Studies Reviewed:    The following studies were reviewed today: Lab work, prior office note, EKGs reviewed.  EKG:  EKG is  ordered today.  The ekg ordered today demonstrates 11/13/16  Recent Labs: 06/05/2016: ALT 10; Magnesium 1.5  11/13/2016: BUN 20; Creatinine, Ser 1.10; Hemoglobin 12.1; Platelets 240; Potassium 3.9; Sodium 145; TSH 0.602  Recent Lipid Panel    Component Value Date/Time   CHOL 143 02/29/2016 0928   TRIG 127.0 02/29/2016 0928   HDL 48.60 02/29/2016 0928   CHOLHDL 3 02/29/2016 0928   VLDL 25.4 02/29/2016 0928   LDLCALC 69 02/29/2016 0928   LDLDIRECT 160.2 03/01/2010 1001    Physical Exam:    VS:  BP 102/62   Pulse 72   Ht 5' 4.5" (1.638 m)   Wt 178 lb 6.4 oz (80.9 kg)   LMP  (LMP Unknown)   BMI 30.15 kg/m     Wt Readings from Last 3 Encounters:  11/13/16 178 lb 6.4 oz (80.9 kg)  11/08/16 182 lb (82.6 kg)  09/13/16 181 lb (82.1 kg)     GEN:  Well nourished, well developed in no acute distress HEENT: Normal NECK: No JVD; No carotid bruits LYMPHATICS: No lymphadenopathy CARDIAC: RRR, no murmurs, rubs, gallops RESPIRATORY:  Clear to auscultation without rales, wheezing or rhonchi  ABDOMEN: Soft, non-tender, non-distended MUSCULOSKELETAL:  No edema; No deformity  SKIN: Warm and dry NEUROLOGIC:  Alert and oriented x 3 PSYCHIATRIC:  Normal affect     ASSESSMENT:    1. Paroxysmal atrial fibrillation (HCC)   2. Long-term use of high-risk medication   3. Peripheral vascular disease ([place])   4. Diabetes mellitus with coincident hypertension (HCC)    PLAN:    In order of problems listed above:  Paroxysmal atrial fibrillation  - Check echocardiogram  - Check TSH, be met, CBC  - Continue with Xarelto. If urinary bleeding continues, consider urologic referral. Could stop Xarelto for a few days until clearing occurs and then restart.  - Agree with stopping Plavix  - I would also stop her aspirin since she will be fully anticoagulated.  - She did feel some sweats, perhaps fevers which broke, I wonder she was having a viral syndrome that may have triggered her atrial fibrillation. Nonetheless, it was asymptomatic and she has many strokes in her family and I fear that she may return to atrial fibrillation and if she is not protected could have a stroke herself.  - I will give her low-dose diltiazem CD 120 mg once a day.  Chronic anti-coag   - Xarelto  This patients CHA2DS2-VASc Score and unadjusted Ischemic Stroke Rate (% per year) is equal to 4.8 % stroke rate/year from a score of 4  Above score calculated as 1 point each if present [CHF, HTN, DM, Vascular=MI/PAD/Aortic Plaque, Age if 65-74, or Female] Above score calculated as 2 points each if present [Age > 75, or Stroke/TIA/TE]  Diabetes with hypertension  - Per Dr. [name]  PVD  - Post bypass  - Dr. Trula Slade  We will have her come back in one month to see APP  Medication Adjustments/Labs and Tests Ordered: Current medicines are reviewed at length with the patient today.  Concerns regarding medicines are outlined above.  Orders Placed This Encounter  Procedures  . CBC  . Basic metabolic panel  . TSH  . EKG 12-Lead  . ECHOCARDIOGRAM COMPLETE   Meds ordered this encounter  Medications  . rivaroxaban (XARELTO) 20 MG TABS tablet    Sig: Take 1 tablet (20 mg total) by mouth  daily with supper.    Dispense:  30 tablet    Refill:  11  . diltiazem (CARDIZEM CD) 120 MG 24 hr capsule    Sig: Take 1  capsule (120 mg total) by mouth daily.    Dispense:  30 capsule    Refill:  11    Signed, [name], MD  11/15/2016 6:39 AM    [facility]

## 2016-11-13 NOTE — Patient Instructions (Addendum)
Medication Instructions:  Please discontinue your ASA. Take Xarelto 20 mg a day. Start Diltiazem CD 120 mg a day. Continue all other medications as listed.  Labwork: Please have blood work today (CBC, TSH and BMP)  Testing/Procedures: Your physician has requested that you have an echocardiogram. Echocardiography is a painless test that uses sound waves to create images of your heart. It provides your doctor with information about the size and shape of your heart and how well your heart's chambers and valves are working. This procedure takes approximately one hour. There are no restrictions for this procedure.  Follow-Up: Follow up in 1 month with Truitt Merle, NP.  If you need a refill on your cardiac medications before your next appointment, please call your pharmacy.  Thank you for choosing [facility]!!

## 2016-11-13 NOTE — Telephone Encounter (Signed)
[place] approved pharmacy contacted 

## 2016-11-15 LAB — BASIC METABOLIC PANEL
BUN/Creatinine Ratio: 18 (ref 9–23)
BUN: 20 mg/dL (ref 6–24)
CALCIUM: 9.6 mg/dL (ref 8.7–10.2)
CHLORIDE: 103 mmol/L (ref 96–106)
CO2: 20 mmol/L (ref 20–29)
Creatinine, Ser: 1.1 mg/dL — ABNORMAL HIGH (ref 0.57–1.00)
GFR calc Af Amer: 64 mL/min/{1.73_m2} (ref >59)
GFR calc non Af Amer: 55 mL/min/{1.73_m2} — ABNORMAL LOW (ref >59)
Glucose: 134 mg/dL — ABNORMAL HIGH (ref 65–99)
POTASSIUM: 3.9 mmol/L (ref 3.5–5.2)
Sodium: 145 mmol/L — ABNORMAL HIGH (ref 134–144)

## 2016-11-15 LAB — CBC
Hematocrit: 36.4 % (ref 34.0–46.6)
Hemoglobin: 12.1 g/dL (ref 11.1–15.9)
MCH: 27.4 pg (ref 26.6–33.0)
MCHC: 33.2 g/dL (ref 31.5–35.7)
MCV: 83 fL (ref 79–97)
PLATELETS: 240 10*3/uL (ref 150–379)
RBC: 4.41 x10E6/uL (ref 3.77–5.28)
RDW: 16.1 % — AB (ref 12.3–15.4)
WBC: 7.7 10*3/uL (ref 3.4–10.8)

## 2016-11-15 LAB — TSH: TSH: 0.602 u[IU]/mL (ref 0.450–4.500)

## 2016-11-22 ENCOUNTER — Ambulatory Visit (HOSPITAL_COMMUNITY): Payer: PPO | Attending: Internal Medicine

## 2016-11-22 ENCOUNTER — Other Ambulatory Visit: Payer: Self-pay

## 2016-11-22 DIAGNOSIS — I480 Paroxysmal atrial fibrillation: Secondary | ICD-10-CM | POA: Insufficient documentation

## 2016-11-22 DIAGNOSIS — I050 Rheumatic mitral stenosis: Secondary | ICD-10-CM | POA: Insufficient documentation

## 2016-11-23 DIAGNOSIS — H401121 Primary open-angle glaucoma, left eye, mild stage: Secondary | ICD-10-CM | POA: Diagnosis not present

## 2016-11-23 DIAGNOSIS — H401112 Primary open-angle glaucoma, right eye, moderate stage: Secondary | ICD-10-CM | POA: Diagnosis not present

## 2016-11-23 DIAGNOSIS — Z961 Presence of intraocular lens: Secondary | ICD-10-CM | POA: Diagnosis not present

## 2016-11-23 DIAGNOSIS — H26493 Other secondary cataract, bilateral: Secondary | ICD-10-CM | POA: Diagnosis not present

## 2016-11-28 DIAGNOSIS — I779 Disorder of arteries and arterioles, unspecified: Secondary | ICD-10-CM | POA: Diagnosis not present

## 2016-11-28 DIAGNOSIS — E1151 Type 2 diabetes mellitus with diabetic peripheral angiopathy without gangrene: Secondary | ICD-10-CM | POA: Diagnosis not present

## 2016-11-28 DIAGNOSIS — Z89422 Acquired absence of other left toe(s): Secondary | ICD-10-CM | POA: Diagnosis not present

## 2016-11-28 DIAGNOSIS — S91311A Laceration without foreign body, right foot, initial encounter: Secondary | ICD-10-CM | POA: Diagnosis not present

## 2016-12-07 DIAGNOSIS — E1151 Type 2 diabetes mellitus with diabetic peripheral angiopathy without gangrene: Secondary | ICD-10-CM | POA: Diagnosis not present

## 2016-12-07 DIAGNOSIS — S91311D Laceration without foreign body, right foot, subsequent encounter: Secondary | ICD-10-CM | POA: Diagnosis not present

## 2016-12-07 DIAGNOSIS — I779 Disorder of arteries and arterioles, unspecified: Secondary | ICD-10-CM | POA: Diagnosis not present

## 2016-12-11 ENCOUNTER — Ambulatory Visit: Payer: PPO | Admitting: Internal Medicine

## 2016-12-12 DIAGNOSIS — E1151 Type 2 diabetes mellitus with diabetic peripheral angiopathy without gangrene: Secondary | ICD-10-CM | POA: Diagnosis not present

## 2016-12-12 DIAGNOSIS — S91311D Laceration without foreign body, right foot, subsequent encounter: Secondary | ICD-10-CM | POA: Diagnosis not present

## 2016-12-13 ENCOUNTER — Encounter: Payer: Self-pay | Admitting: Internal Medicine

## 2016-12-13 ENCOUNTER — Ambulatory Visit (INDEPENDENT_AMBULATORY_CARE_PROVIDER_SITE_OTHER): Payer: PPO | Admitting: Internal Medicine

## 2016-12-13 VITALS — BP 132/82 | HR 59 | Wt 178.0 lb

## 2016-12-13 DIAGNOSIS — E1151 Type 2 diabetes mellitus with diabetic peripheral angiopathy without gangrene: Secondary | ICD-10-CM

## 2016-12-13 DIAGNOSIS — E1165 Type 2 diabetes mellitus with hyperglycemia: Secondary | ICD-10-CM

## 2016-12-13 DIAGNOSIS — IMO0002 Reserved for concepts with insufficient information to code with codable children: Secondary | ICD-10-CM

## 2016-12-13 MED ORDER — INSULIN DETEMIR 100 UNIT/ML FLEXPEN: 80.0000 [IU] | PEN_INJECTOR | Freq: Every day | SUBCUTANEOUS | 3 refills | Status: DC

## 2016-12-13 MED ORDER — FREESTYLE LIBRE READER DEVI: 1.0000 | Freq: Three times a day (TID) | 1 refills | Status: DC

## 2016-12-13 MED ORDER — INSULIN ASPART 100 UNIT/ML FLEXPEN: 20.0000 [IU] | PEN_INJECTOR | Freq: Three times a day (TID) | SUBCUTANEOUS | 11 refills | Status: DC

## 2016-12-13 MED ORDER — FREESTYLE LIBRE SENSOR SYSTEM MISC: 1.0000 | 11 refills | Status: DC

## 2016-12-13 NOTE — Progress Notes (Signed)
Follow Patient ID: Theresa Harrington, female   DOB: 02-23-1956, 61 y.o.   MRN: [id number]   GBT:DVVOH Theresa Harrington is a 61 y.o.-year-old femaleyear-old female, returning for follow-up for DM2, dx in 1980s, insulin-dependent since 2017, uncontrolled, with long-term complications (PAF, PAD  - s/p R 2nd toe amputation 2016). Last visit 3.5 months ago.  She has a new diagnosis of paroxysmal atrial fibrillation since 10/2016. She was started on Xarelto and Cardizem.  Last hemoglobin A1c was higher: Lab Results  Component Value Date   HGBA1C 9.9 11/08/2016   HGBA1C 9.3 08/30/2016   HGBA1C 10.0 (H) 06/05/2016   Pt was on a regimen of: - Metformin 1000 mg 2x a day, with meals - Amaryl 2 mg before b'fast - Levemir 110 units at bedtime - Novolog 0-10 units 3x a day units 3x a day, before meals She stopped the Bydureon >> did not help.  At last visit, we changed the regimen as follows: - Levemir 80 units at bedtime - Novolog as follows: - 14 units before a smaller meal - 18 units before a larger meals - Metformin 1000 mg 2x a day with meals.  Pt checks her sugars 1-2x a day and they are: - am: 47-137 >> 72-191, 204 - 2h after b'fast: n/c - before lunch: n/c - 2h after lunch: n/c - before dinner: n/c - 2h after dinner: n/c - bedtime: n/c >> 213-300 (may forget Novolog)  - nighttime: 200-346 >> n/c >> 193 + lows. Lowest sugar was 47 >> 72 ; she has hypoglycemia awareness at 60s.  Highest sugar was 346 1 month ago >> 300s.  Glucometer: Precision Extra  Pt's meals are: - Breakfast: 1 boiled egg + 1 cheese toast + coffee - Lunch: salad or sausage croissant, etc. - Dinner: differs >> hamburger and salad, 1/2 sodas - Snacks: none Reg. Sodas, tea!  - No CKD, last BUN/creatinine:  Lab Results  Component Value Date   BUN 20 11/13/2016   BUN 21 06/05/2016   CREATININE 1.10 (H) 11/13/2016   CREATININE 0.76 06/05/2016  On losartan. - last set of lipids: Lab Results  Component Value Date   CHOL 143  02/29/2016   HDL 48.60 02/29/2016   LDLCALC 69 02/29/2016   LDLDIRECT 160.2 03/01/2010   TRIG 127.0 02/29/2016   CHOLHDL 3 02/29/2016  On Zetia, Pravastatin. - last eye exam: in 2017 >> No DR  - She denies numbness and tingling in her feet.  Of note, she has a history of lap band or drink 2007, and following breast and thigh lift and abdominoplasty in 2009. She also has HTN, HL  ROS: Constitutional: no weight gain/no weight loss, no fatigue, no subjective hyperthermia, no subjective hypothermia, + nocturia Eyes: no blurry vision, no xerophthalmia ENT: no sore throat, no nodules palpated in throat, no dysphagia, no odynophagia, no hoarseness Cardiovascular: no CP/no SOB/no palpitations/no leg swelling Respiratory: no cough/no SOB/no wheezing Gastrointestinal: no N/no V/no D/no C/no acid reflux Musculoskeletal: no muscle aches/no joint aches Skin: no rashes, no hair loss Neurological: no tremors/no numbness/no tingling/no dizziness  I reviewed pt's medications, allergies, PMH, social hx, family hx, and changes were documented in the history of present illness. Otherwise, unchanged from my initial visit note.  Past Medical History:  Diagnosis Date  . Anemia   . Anxiety   . Asymptomatic cholelithiasis   . Atherosclerosis of aorta ([place])   . GERD (gastroesophageal reflux disease)   . Hypertension   . Hypothyroidism    no meds  .  PAD (peripheral artery disease) ([place])   . Peripheral arterial occlusive disease (HCC)    lower extremities  . Pneumonia   . PONV (postoperative nausea and vomiting)   . Right ureteral stone   . Type 2 diabetes mellitus ([place])   . Wears glasses    Past Surgical History:  Procedure Laterality Date  . AMPUTATION Left 07/16/2014   Procedure: LEFT SECOND TOE AMPUTATION;  Surgeon: Serafina Mitchell, MD;  Location: [facility];  Service: Vascular;  Laterality: Left;  With Nerve block  . AUGMENTATION MAMMAPLASTY    . BELPHAROPTOSIS REPAIR     eyelid lift  .  COMBINED AUGMENTATION MAMMAPLASTY AND ABDOMINOPLASTY  2009   W/  BILATERAL  THIGH LIFT  . CYSTO/  RIGHT URETERAL STENT PLACEMENT  12-30-2010  . CYSTOSCOPY WITH RETROGRADE PYELOGRAM, URETEROSCOPY AND STENT PLACEMENT Right 10/07/2013   Procedure: CYSTOSCOPY WITH RETROGRADE PYELOGRAM, right URETEROSCOPY AND STENT PLACEMENT, stone extraction;  Surgeon: Arvil Persons, MD;  [facility];  Service: Urology;  Laterality: Right;  . CYSTOSCOPY WITH STENT PLACEMENT Right 06/04/2013   Procedure: CYSTOSCOPY WITH STENT PLACEMENT;  Surgeon: Franchot Gallo, MD;  Location[facility];  Service: Urology;  Laterality: Right;  . DILATATION & CURETTAGE/HYSTEROSCOPY WITH MYOSURE N/A 04/27/2015   Procedure: DILATATION & CURETTAGE/HYSTEROSCOPY WITH MYOSURE;  Surgeon: Terrance Mass, MD;  Location: [facility];  Service: Gynecology;  Laterality: N/A;  . ENDARTERECTOMY FEMORAL Left 06/08/2014   Procedure: Left Leg Common Femoral and External Iliac  Endartarectomy with patch Angioplasty;  Surgeon: Rosetta Posner, MD;  Location: [place];  Service: Vascular;  Laterality: Left;  . EXTRACORPOREAL SHOCK WAVE LITHOTRIPSY Right 08-04-2013//   06-23-2013//   01-16-2011  . FEMORAL-POPLITEAL BYPASS GRAFT Left 06/08/2014   Procedure: Left Leg Femoral -Popliteal Bypass Graft;  Surgeon: Rosetta Posner, MD;  Location: [facility];  Service: Vascular;  Laterality: Left;  . FEMORAL-POPLITEAL BYPASS GRAFT Left 06/09/2014   Procedure: Left Femoral and Popliteal Exposure; Left Femoral to Anterior Tibial Bypass Graft using Propaten 34mm by 80cm Goretex Graft; Left Tibial Endarterectomy; Left Femoraland Popliteal Thrombectomy ;  Surgeon: Serafina Mitchell, MD;  Location: [place];  Service: Vascular;  Laterality: Left;  . HOLMIUM LASER APPLICATION Right 10/01/9474   Procedure: HOLMIUM LASER APPLICATION;  Surgeon: Arvil Persons, MD;  Location[facility];  Service: Urology;  Laterality: Right;  . KIDNEY STONE SURGERY  April 2015   1-2 stones   . LAPAROSCOPIC GASTRIC BANDING  05-29-2005  . LITHOTRIPSY  2-3 times  . LOWER EXTREMITY ANGIOGRAM N/A 06/04/2014   Procedure: LOWER EXTREMITY ANGIOGRAM;  Surgeon: Serafina Mitchell, MD;  Location: [facility];  Service: Cardiovascular;  Laterality: N/A;  . ORIF FIFTH METACARPAL [place]  RIGHT HAND  04-21-2002  . REVISION AND RE-SITING LAP-BAND PORT  04-08-2010   W/  UPPER EGD  . RIGHT KNEE PATELLECTOMY W/ REPAIR OF EXTENSOR MECHANISM  04-14-2002  . Toenail removed Left Jan. 21, 2016   2nd toenail-  Dr. Barkley Bruns   Social History   Social History  . Marital status: Single    Spouse name: N/A  . Number of children: N/A  . 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 Smoking status: Former Smoker    Packs/day: 1.00    Years: 28.00    Types: Cigarettes    Quit date: 09/05/2001  . Smokeless tobacco: Never Used  . Alcohol  use No  . Drug use: No  . Sexual activity: Yes   Other Topics Concern  . Not on file   Social History Narrative   Regular exercise- yes   Current Outpatient Prescriptions on File Prior to Visit  Medication Sig Dispense Refill  . diltiazem (CARDIZEM CD) 120 MG 24 hr capsule Take 1 capsule (120 mg total) by mouth daily. 30 capsule 11  . ezetimibe (ZETIA) 10 MG tablet Take 1 tablet (10 mg total) by mouth daily. 90 tablet 3  . insulin aspart (NOVOLOG FLEXPEN) 100 UNIT/ML FlexPen Inject 10 Units into the skin 3 (three) times daily with meals. 15 mL 11  . Insulin Detemir (LEVEMIR FLEXTOUCH) 100 UNIT/ML Pen Inject 120 Units into the skin daily at 10 pm. DX 250.72 30 mL 3  . losartan-hydrochlorothiazide (HYZAAR) 100-12.5 MG tablet Take 1 tablet by mouth daily. 90 tablet 1  . magnesium oxide (MAG-OX) 400 MG tablet Take 1 tablet (400 mg total) by mouth daily. 90 tablet 1  . metFORMIN (GLUCOPHAGE) 1000 MG tablet TAKE ONE TABLET BY MOUTH TWICE DAILY WITH MEALS 180 tablet 1  . pravastatin (PRAVACHOL) 40 MG tablet TAKE  ONE TABLET BY MOUTH ONCE DAILY 90 tablet 3  . rivaroxaban (XARELTO) 20 MG TABS tablet Take 1 tablet (20 mg total) by mouth daily with supper. 30 tablet 11   No current facility-administered medications on file prior to visit.    Allergies  Allergen Reactions  . Amlodipine Swelling  . Atorvastatin Other (See Comments)    Reaction:  Muscle aches   . Ampicillin Nausea And Vomiting and Other (See Comments)      . Codeine Nausea And Vomiting    Ask patient  . Lisinopril Cough       . Penicillins Nausea And Vomiting and Other (See Comments)      . Januvia [Sitagliptin] Nausea And Vomiting   Family History  Problem Relation Age of Onset  . Diabetes Father   . Heart disease Father   . Deep vein thrombosis Father   . Hyperlipidemia Father   . Diabetes Sister   . Heart disease Sister   . Deep vein thrombosis Sister   . Hyperlipidemia Sister   . Diabetes Brother   . Heart disease Brother   . Cancer Brother   . Hyperlipidemia Brother   . Colon cancer Maternal Aunt   . Diabetes Brother   . Diabetes Brother   . Kidney disease Mother   . Hyperlipidemia Mother   . Other Mother        AAA   and    Amputation  . Arthritis Other   . Cancer Other        colon  . Hypertension Other   . Stroke Other    Pt has FH of DM in F,S, 3B.  PE: BP 132/82 (BP Location: Left Arm, Patient Position: Sitting)   Pulse (!) 59   Wt 178 lb (80.7 kg)   LMP  (LMP Unknown)   SpO2 98%   BMI 30.08 kg/m  Wt Readings from Last 3 Encounters:  12/13/16 178 lb (80.7 kg)  11/13/16 178 lb 6.4 oz (80.9 kg)  11/08/16 182 lb (82.6 kg)   Constitutional: overweight, in NAD Eyes: PERRLA, EOMI, no exophthalmos ENT: moist mucous membranes, no thyromegaly, no cervical lymphadenopathy Cardiovascular: RRR, No MRG Respiratory: CTA B Gastrointestinal: abdomen soft, NT, ND, BS+ Musculoskeletal:  strength intact in all 4, R 2nd toe amputated Skin: moist, warm, no rashes Neurological: no tremor  with outstretched  hands, DTR normal in all 4  ASSESSMENT: 1. DM2, insulin-dependent, uncontrolled, with complications - PAD  - s/p R 2nd toe amputation 2016  PLAN:  1. Patient with long-standing, Uncontrolled diabetes on metformin and basal-bolus insulin regimen. She is frustrated about her high blood sugars, however, during the appointment she reveals that she drinks regular soda as (up to twice a day) and also juice. Therefore, her sugars towards the end of the day increased to 200s and she even had a 300 reading. She tells me that her NovoLog dose is usually not enough, and she sometimes checks her sugars 2 hours after a meal and she re-boluses the entire mealtime dose if sugars are higher. - At this visit, we discussed at length about the importance of stopping liquid carbs (regular sodas and juices) and we'll also increase her NovoLog before meals to avoid post prandial correction. However, I advised her that if she still needs to correct her sugars after meals, to use less than a full mealtime dose. - She inquires about the FreeStyle libre CGM, which I highly encouraged her to get and I sent a prescription to her pharmacy-  - I suggested to:  Patient Instructions  Please Continue:  - Levemir 80 units at bedtime  - Metformin 1000 mg 2x a day with meals.  Please increase - Novolog as follows: - 20 units before a smaller meal - 24 units before a larger meal or if you have dessert  ELIMINATE SODAS, JUICES.  Please come back for a follow-up appointment in 3 months.  - we reviewed her most recent HbA1c, which was higher, at 9.9% 1 month ago - continue checking sugars at different times of the day - check 3x a day, rotating checks - advised for yearly eye exams >> she is UTD, will have another appointment tomorrow - Return to clinic in 3 mo with sugar log   - time spent with the patient: 25 min, of whichSugars >50% was spent in obtaining information about her symptoms, reviewing her CBG log, previous  labs, and antidiabetic regimen, counseling her about nutrition (please see the discussed topics above), and developing a plan to further improve her sugars ; she had a number of questions which I addressed.   Philemon Kingdom, MD PhD [facility] Endocrinology

## 2016-12-13 NOTE — Patient Instructions (Addendum)
Please Continue:  - Levemir 80 units at bedtime  - Metformin 1000 mg 2x a day with meals.  Please increase - Novolog as follows: - 20 units before a smaller meal - 24 units before a larger meal or if you have dessert  ELIMINATE SODAS, JUICES.  Please come back for a follow-up appointment in 3 months.

## 2016-12-14 DIAGNOSIS — H26493 Other secondary cataract, bilateral: Secondary | ICD-10-CM | POA: Diagnosis not present

## 2016-12-14 DIAGNOSIS — Z961 Presence of intraocular lens: Secondary | ICD-10-CM | POA: Diagnosis not present

## 2016-12-14 DIAGNOSIS — H401112 Primary open-angle glaucoma, right eye, moderate stage: Secondary | ICD-10-CM | POA: Diagnosis not present

## 2016-12-14 DIAGNOSIS — H401121 Primary open-angle glaucoma, left eye, mild stage: Secondary | ICD-10-CM | POA: Diagnosis not present

## 2016-12-16 IMAGING — MG MM SCREEN MAMMOGRAM BILATERAL
4 series · 4 of 4 positions shown · non-contrast
Comparison: Previous exam(s).

CLINICAL DATA: Screening.

EXAM:
DIGITAL SCREENING BILATERAL MAMMOGRAM WITH CAD

[R CC]
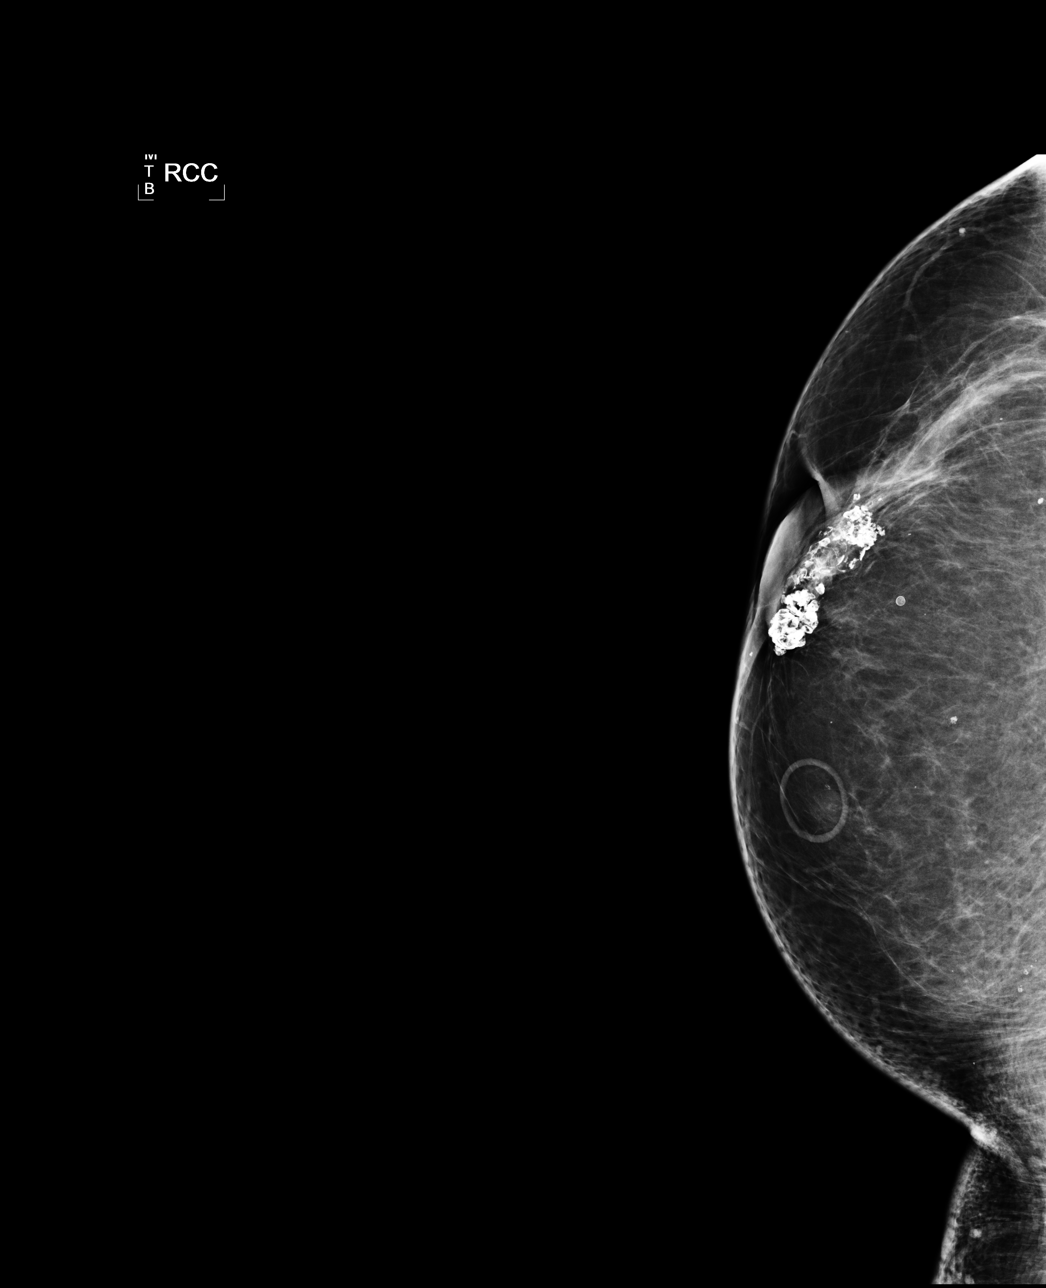

[L CC]
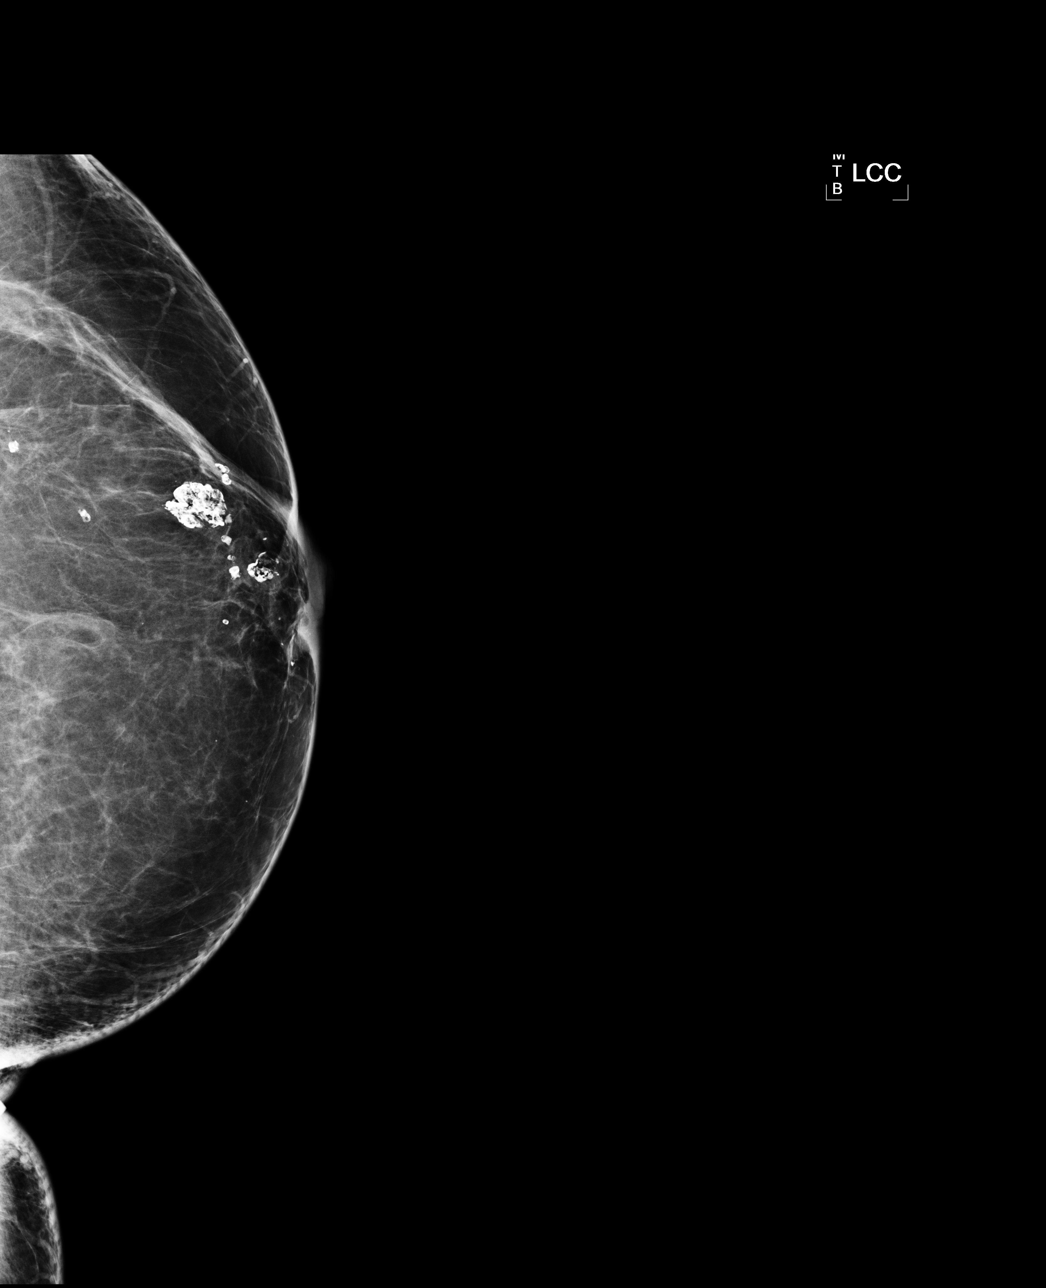

[L MLO]
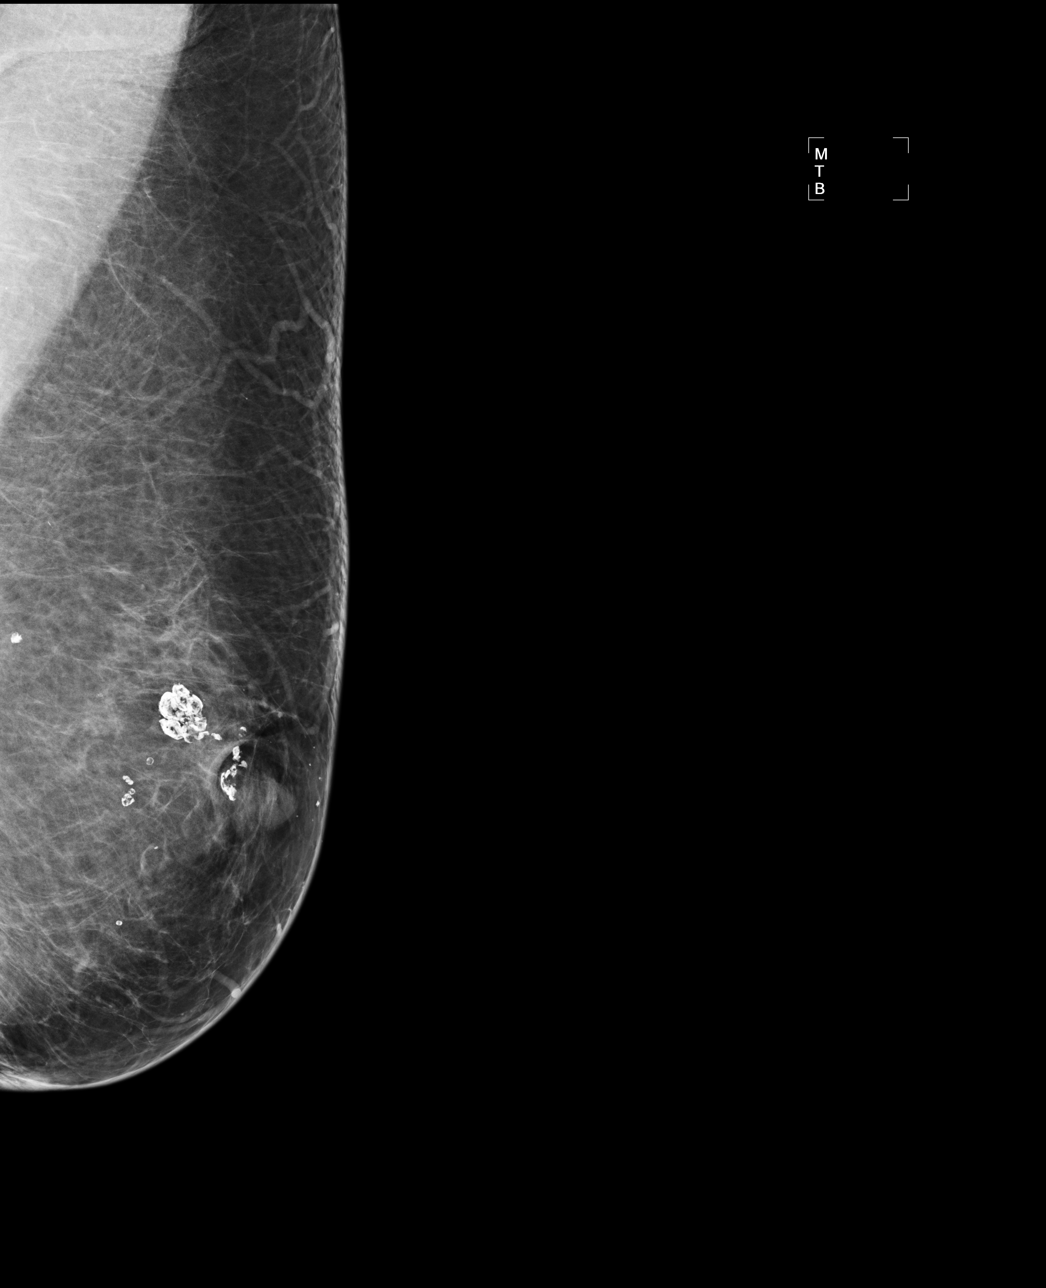

[R MLO]
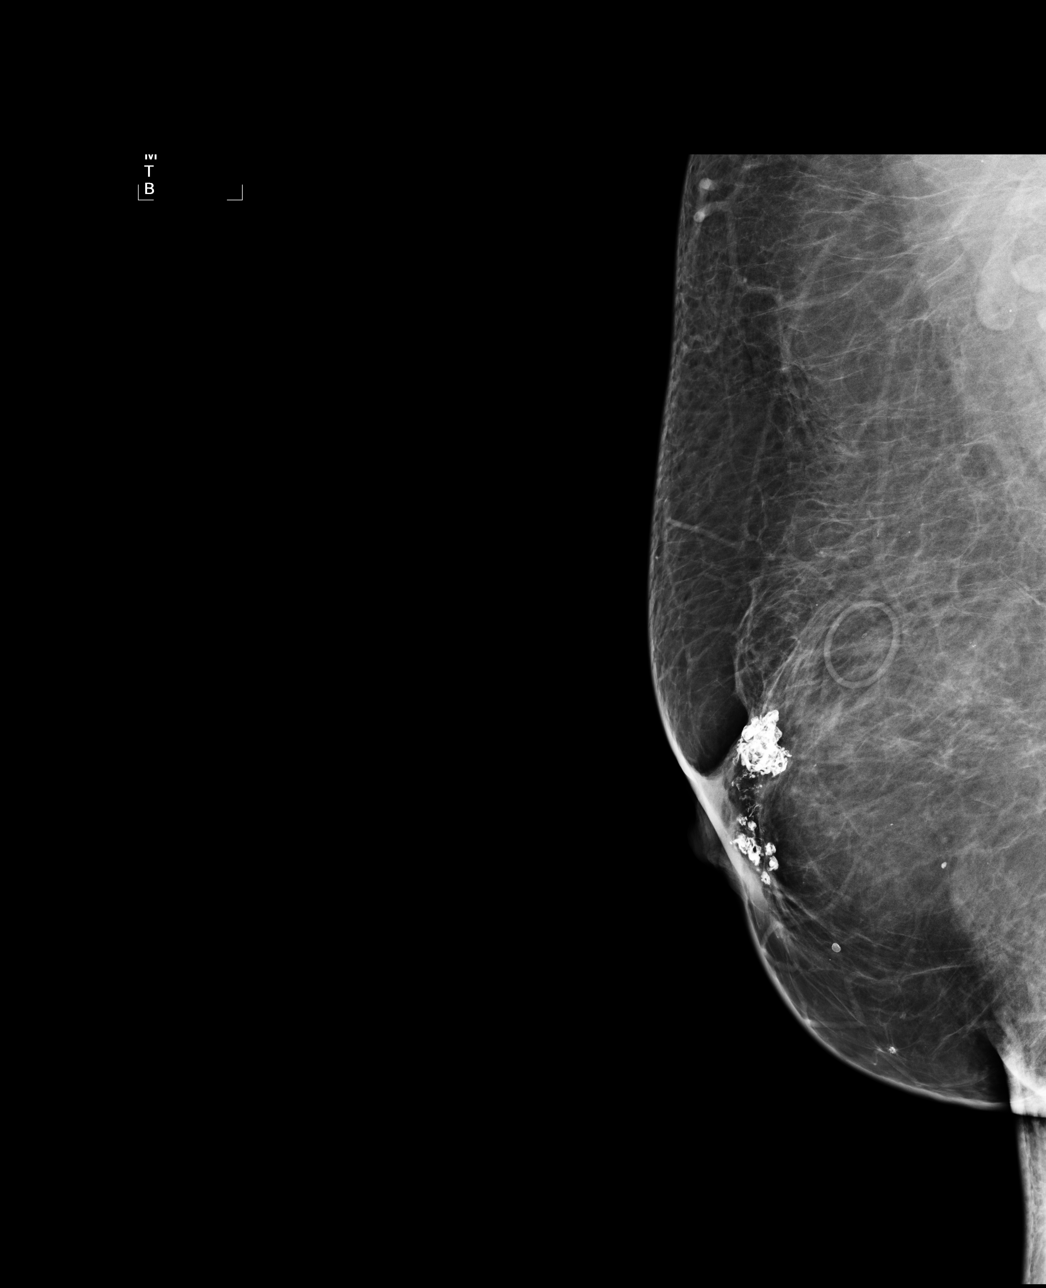

[4 of 4 positions shown; findings below may reference images not displayed]

ACR Breast Density Category b: There are scattered areas of
fibroglandular density.
FINDINGS: There are no findings suspicious for malignancy. Images were
processed with CAD.
IMPRESSION: No mammographic evidence of malignancy. A result letter of this
screening mammogram will be mailed directly to the patient.

RECOMMENDATION:
Screening mammogram in one year. (Code:AS-G-LCT)

BI-RADS CATEGORY  1: Negative.

## 2016-12-18 ENCOUNTER — Other Ambulatory Visit: Payer: Self-pay | Admitting: Internal Medicine

## 2016-12-18 ENCOUNTER — Ambulatory Visit (INDEPENDENT_AMBULATORY_CARE_PROVIDER_SITE_OTHER): Payer: PPO | Admitting: Nurse Practitioner

## 2016-12-18 ENCOUNTER — Encounter: Payer: Self-pay | Admitting: Nurse Practitioner

## 2016-12-18 VITALS — BP 146/76 | HR 64 | Wt 180.2 lb

## 2016-12-18 DIAGNOSIS — Z79899 Other long term (current) drug therapy: Secondary | ICD-10-CM | POA: Diagnosis not present

## 2016-12-18 DIAGNOSIS — I480 Paroxysmal atrial fibrillation: Secondary | ICD-10-CM | POA: Diagnosis not present

## 2016-12-18 NOTE — Patient Instructions (Addendum)
We will be checking the following labs today - BMET & CBC   Medication Instructions:    Continue with your current medicines.     Testing/Procedures To Be Arranged:  N/A  Follow-Up:   See Dr. Marlou Porch in 6 months    Other Special Instructions:   Keep a check on your BP    If you need a refill on your cardiac medications before your next appointment, please call your pharmacy.   Call the [place] office at [phone] if you have any questions, problems or concerns.

## 2016-12-18 NOTE — Progress Notes (Addendum)
CARDIOLOGY OFFICE NOTE  Date:  12/18/2016    Theresa Harrington Date of Birth: [birth year] Medical Record #[id number]  PCP:  [name], MD  Cardiologist:  [facility]  Chief Complaint  Patient presents with  . Atrial Fibrillation    1 month check - seen for Dr. Marlou Porch    History of Present Illness: Theresa Harrington is a 61 y.o. female who presents today for a one month check. Seen for Dr. Marlou Porch.   She has PAF - seen here last month at request of her PCP who had found her to be in AF. Placed on Xarelto. Taken off Plavix.   She has a history of diabetes, hypertension, PAD with femoral-popliteal bypass on 06/08/14 by Dr. Curt Jews and Trula Slade. Her FH is quite + for stroke.   Comes in today. Here alone. She says she is doing well. Never was aware of her AF. Tolerating her medicine without issue. She sleeps alone and does not know if she snores. Her energy level is ok. BP last week was ok. Not short of breath. No chest pain. No swelling. She feels like she is doing well. Still quite worried about stroke risk. She does not really have "a large meal of the day" - she has been taking her Xarelto each morning consistently and has not missed any doses. She had some blood in her urine - this did stop and has not returned.   Past Medical History:  Diagnosis Date  . Anemia   . Anxiety   . Asymptomatic cholelithiasis   . Atherosclerosis of aorta ([place])   . GERD (gastroesophageal reflux disease)   . Hypertension   . Hypothyroidism    no meds  . PAD (peripheral artery disease) ([place])   . Peripheral arterial occlusive disease (HCC)    lower extremities  . Pneumonia   . PONV (postoperative nausea and vomiting)   . Right ureteral stone   . Type 2 diabetes mellitus ([place])   . Wears glasses     Past Surgical History:  Procedure Laterality Date  . AMPUTATION Left 07/16/2014   Procedure: LEFT SECOND TOE AMPUTATION;  Surgeon: Serafina Mitchell, MD;  Location: [facility];  Service: Vascular;  Laterality: Left;   With Nerve block  . AUGMENTATION MAMMAPLASTY    . BELPHAROPTOSIS REPAIR     eyelid lift  . COMBINED AUGMENTATION MAMMAPLASTY AND ABDOMINOPLASTY  2009   W/  BILATERAL  THIGH LIFT  . CYSTO/  RIGHT URETERAL STENT PLACEMENT  12-30-2010  . CYSTOSCOPY WITH RETROGRADE PYELOGRAM, URETEROSCOPY AND STENT PLACEMENT Right 10/07/2013   Procedure: CYSTOSCOPY WITH RETROGRADE PYELOGRAM, right URETEROSCOPY AND STENT PLACEMENT, stone extraction;  Surgeon: Arvil Persons, MD;  Location: [facility];  Service: Urology;  Laterality: Right;  . CYSTOSCOPY WITH STENT PLACEMENT Right 06/04/2013   Procedure: CYSTOSCOPY WITH STENT PLACEMENT;  Surgeon: Franchot Gallo, MD;  Location: [facility];  Service: Urology;  Laterality: Right;  . DILATATION & CURETTAGE/HYSTEROSCOPY WITH MYOSURE N/A 04/27/2015   Procedure: DILATATION & CURETTAGE/HYSTEROSCOPY WITH MYOSURE;  Surgeon: Terrance Mass, MD;  Location: [facility];  Service: Gynecology;  Laterality: N/A;  . ENDARTERECTOMY FEMORAL Left 06/08/2014   Procedure: Left Leg Common Femoral and External Iliac  Endartarectomy with patch Angioplasty;  Surgeon: Rosetta Posner, MD;  Location: [place];  Service: Vascular;  Laterality: Left;  . EXTRACORPOREAL SHOCK WAVE LITHOTRIPSY Right 08-04-2013//   06-23-2013//   01-16-2011  . FEMORAL-POPLITEAL BYPASS GRAFT Left 06/08/2014   Procedure: Left Leg  Femoral -Popliteal Bypass Graft;  Surgeon: Rosetta Posner, MD;  Location: [facility];  Service: Vascular;  Laterality: Left;  . FEMORAL-POPLITEAL BYPASS GRAFT Left 06/09/2014   Procedure: Left Femoral and Popliteal Exposure; Left Femoral to Anterior Tibial Bypass Graft using Propaten 11m by 80cm Goretex Graft; Left Tibial Endarterectomy; Left Femoraland Popliteal Thrombectomy ;  Surgeon: VSerafina Mitchell MD;  [facility] [facility]  Service: Vascular;  Laterality: Left;  . HOLMIUM LASER APPLICATION Right 66/0/1093  Procedure: HOLMIUM LASER APPLICATION;  Surgeon: MArvil Persons MD;  [facility]  Service: Urology;  Laterality: Right;  . KIDNEY STONE SURGERY  April 2015   1-2 stones  . LAPAROSCOPIC GASTRIC BANDING  05-29-2005  . LITHOTRIPSY  2-3 times  . LOWER EXTREMITY ANGIOGRAM N/A 06/04/2014   Procedure: LOWER EXTREMITY ANGIOGRAM;  Surgeon: VSerafina Mitchell MD;  [facility];  Service: Cardiovascular;  Laterality: N/A;  . ORIF FIFTH METACARPAL [place] RIGHT HAND  04-21-2002  . REVISION AND RE-SITING LAP-BAND PORT  04-08-2010   W/  UPPER EGD  . RIGHT KNEE PATELLECTOMY W/ REPAIR OF EXTENSOR MECHANISM  04-14-2002  . Toenail removed Left Jan. 21, 2016   2nd toenail-  Dr. PBarkley Bruns    Medications: Current Meds  Medication Sig  . Continuous Blood Gluc Receiver (FREESTYLE LIBRE READER) DEVI 1 Device by Does not apply route 3 (three) times daily.  . Continuous Blood Gluc Sensor (FREESTYLE LIBRE SENSOR SYSTEM) MISC 1 Device by Does not apply route every 30 (thirty) days.  .[name] Kitchendiltiazem (CARDIZEM CD) 120 MG 24 hr capsule Take 1 capsule (120 mg total) by mouth daily.  .[name] Kitchenezetimibe (ZETIA) 10 MG tablet Take 1 tablet (10 mg total) by mouth daily.  . insulin aspart (NOVOLOG FLEXPEN) 100 UNIT/ML FlexPen Inject 20-24 Units into the skin 3 (three) times daily with meals.  . Insulin Detemir (LEVEMIR FLEXTOUCH) 100 UNIT/ML Pen Inject 80 Units into the skin daily at 10 pm. DX 250.72  . losartan-hydrochlorothiazide (HYZAAR) 100-12.5 MG tablet TAKE 1 TABLET BY MOUTH ONCE DAILY  . magnesium oxide (MAG-OX) 400 MG tablet Take 1 tablet (400 mg total) by mouth daily.  . metFORMIN (GLUCOPHAGE) 1000 MG tablet TAKE ONE TABLET BY MOUTH TWICE DAILY WITH MEALS  . pravastatin (PRAVACHOL) 40 MG tablet TAKE ONE TABLET BY MOUTH ONCE DAILY  . rivaroxaban (XARELTO) 20 MG TABS tablet Take 1 tablet (20 mg total) by mouth daily with supper.     Allergies: Allergies  Allergen Reactions  . Amlodipine Swelling  . Atorvastatin Other (See Comments)    Reaction:  Muscle aches   . Ampicillin Nausea And  Vomiting and Other (See Comments)  . Codeine Nausea And Vomiting    Ask patient  . Lisinopril Cough       . Penicillins Nausea And Vomiting and Other (See Comments)  . Januvia [Sitagliptin] Nausea And Vomiting    Social History: The patient  reports that she quit smoking about 15 years ago. Her smoking use included Cigarettes. She has a 28.00 pack-year smoking history. She has never used smokeless tobacco. She reports that she does not drink alcohol or use drugs.   Family History: The patient's family history includes Arthritis in her other; Cancer in her brother and other; Colon cancer in her maternal aunt; Deep vein thrombosis in her father and sister; Diabetes in her brother, brother, brother, father, and sister; Heart disease in her brother, father, and sister; Hyperlipidemia in her brother, father, mother,  and sister; Hypertension in her other; Kidney disease in her mother; Other in her mother; Stroke in her other.   Review of Systems: Please see the history of present illness.   Otherwise, the review of systems is positive for none.   All other systems are reviewed and negative.   Physical Exam: VS:  BP (!) 146/76 (BP Location: Left Arm, Patient Position: Sitting, Cuff Size: Normal)   Pulse 64   Wt 180 lb 3.2 oz (81.7 kg)   LMP  (LMP Unknown)   SpO2 100% Comment: at rest  BMI 30.45 kg/m  .  BMI Body mass index is 30.45 kg/m.  Wt Readings from Last 3 Encounters:  12/18/16 180 lb 3.2 oz (81.7 kg)  12/13/16 178 lb (80.7 kg)  11/13/16 178 lb 6.4 oz (80.9 kg)   Repeat BP by me is 140/70 General: Pleasant. Well developed, well nourished and in no acute distress.   HEENT: Normal.  Neck: Supple, no JVD, carotid bruits, or masses noted.  Cardiac: Regular rate and rhythm. No murmurs, rubs, or gallops. No edema.  Respiratory:  Lungs are clear to auscultation bilaterally with normal work of breathing.  GI: Soft and nontender.  MS: No deformity or atrophy. Gait and ROM intact.    Skin: Warm and dry. Color is normal.  Neuro:  Strength and sensation are intact and no gross focal deficits noted.  Psych: Alert, appropriate and with normal affect.   LABORATORY DATA:  EKG:  EKG is not ordered today.  Lab Results  Component Value Date   WBC 7.7 11/13/2016   HGB 12.1 11/13/2016   HCT 36.4 11/13/2016   PLT 240 11/13/2016   GLUCOSE 134 (H) 11/13/2016   CHOL 143 02/29/2016   TRIG 127.0 02/29/2016   HDL 48.60 02/29/2016   LDLDIRECT 160.2 03/01/2010   LDLCALC 69 02/29/2016   ALT 10 06/05/2016   AST 10 06/05/2016   NA 145 (H) 11/13/2016   K 3.9 11/13/2016   CL 103 11/13/2016   CREATININE 1.10 (H) 11/13/2016   BUN 20 11/13/2016   CO2 20 11/13/2016   TSH 0.602 11/13/2016   INR 1.05 07/16/2014   HGBA1C 9.9 11/08/2016   MICROALBUR 2.7 (H) 02/29/2016     BNP (last 3 results) No results for input(s): BNP in the last 8760 hours.  ProBNP (last 3 results) No results for input(s): PROBNP in the last 8760 hours.   Other Studies Reviewed Today:  Echo Study Conclusions 10/2016  - Left ventricle: The cavity size was normal. Wall thickness was   normal. Systolic function was normal. The estimated ejection   fraction was in the range of 55% to 60%. Wall motion was normal;   there were no regional wall motion abnormalities. The study is   not technically sufficient to allow evaluation of LV diastolic   function. - Mitral valve: Moderately calcified annulus. Mildly thickened   leaflets . Mild stenosis. There was trivial regurgitation. Valve   area by pressure half-time: 1.79 cm^2. - Left atrium: Severely dilated. - Right atrium: The atrium was mildly dilated. - Inferior vena cava: The vessel was normal in size. The   respirophasic diameter changes were in the normal range (>= 50%),   consistent with normal central venous pressure.  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  Assessment/Plan:  1. Paroxysmal atrial fibrillation - now on Xarelto -  noted severe LAE on echo - she is in sinus by exam today. Would favor her continued regimen. Continue Xarelto.   2. Chronic anticoagulation with Xarelto - recheck her labs today. No further GU bleeding noted.   3. Diabetes = followed by PCP  4. Hypertension - fair control - I have asked her to monitor at home - explained that this is a risk factor for her having AF.   5. PVD with prior surgical intervention - followed at [facility] - not discussed today.   6. Obesity - we talked about eating less carbs.    Current medicines are reviewed with the patient today.  The patient does not have concerns regarding medicines other than what has been noted above.  The following changes have been made:  See above.  Labs/ tests ordered today include:    Orders Placed This Encounter  Procedures  . Basic metabolic panel  . CBC     Disposition:   FU with Dr. Marlou Porch in 6 months.   Patient is agreeable to this plan and will call if any problems develop in the interim.   SignedTruitt Merle, NP  12/18/2016 2:10 PM  [facility] [address] [place], [place]  [address] Phone: [phone] Fax: [phone]

## 2016-12-19 LAB — BASIC METABOLIC PANEL
BUN/Creatinine Ratio: 22 (ref 9–23)
BUN: 16 mg/dL (ref 6–24)
CO2: 23 mmol/L (ref 20–29)
Calcium: 9.6 mg/dL (ref 8.7–10.2)
Chloride: 100 mmol/L (ref 96–106)
Creatinine, Ser: 0.74 mg/dL (ref 0.57–1.00)
GFR calc Af Amer: 103 mL/min/{1.73_m2} (ref >59)
GFR calc non Af Amer: 89 mL/min/{1.73_m2} (ref >59)
Glucose: 118 mg/dL — ABNORMAL HIGH (ref 65–99)
Potassium: 4.4 mmol/L (ref 3.5–5.2)
Sodium: 141 mmol/L (ref 134–144)

## 2016-12-19 LAB — CBC
Hematocrit: 35.7 % (ref 34.0–46.6)
Hemoglobin: 11.8 g/dL (ref 11.1–15.9)
MCH: 27.3 pg (ref 26.6–33.0)
MCHC: 33.1 g/dL (ref 31.5–35.7)
MCV: 83 fL (ref 79–97)
Platelets: 317 10*3/uL (ref 150–379)
RBC: 4.32 x10E6/uL (ref 3.77–5.28)
RDW: 15.7 % — ABNORMAL HIGH (ref 12.3–15.4)
WBC: 10.4 10*3/uL (ref 3.4–10.8)

## 2016-12-21 DIAGNOSIS — E1151 Type 2 diabetes mellitus with diabetic peripheral angiopathy without gangrene: Secondary | ICD-10-CM | POA: Diagnosis not present

## 2016-12-21 DIAGNOSIS — S91311D Laceration without foreign body, right foot, subsequent encounter: Secondary | ICD-10-CM | POA: Diagnosis not present

## 2016-12-21 DIAGNOSIS — Z89422 Acquired absence of other left toe(s): Secondary | ICD-10-CM | POA: Diagnosis not present

## 2016-12-29 ENCOUNTER — Other Ambulatory Visit: Payer: Self-pay | Admitting: Internal Medicine

## 2017-01-04 DIAGNOSIS — S91311D Laceration without foreign body, right foot, subsequent encounter: Secondary | ICD-10-CM | POA: Diagnosis not present

## 2017-01-04 DIAGNOSIS — E1151 Type 2 diabetes mellitus with diabetic peripheral angiopathy without gangrene: Secondary | ICD-10-CM | POA: Diagnosis not present

## 2017-01-08 ENCOUNTER — Telehealth: Payer: Self-pay | Admitting: Internal Medicine

## 2017-01-08 NOTE — Telephone Encounter (Signed)
Pt called in and said that she should have some levemir her to pick up?  Have you seen it?

## 2017-01-08 NOTE — Telephone Encounter (Signed)
Notified patient that insulin is ready for pickup.

## 2017-01-08 NOTE — Telephone Encounter (Signed)
Yes, her levemir is here in the side a insulin fridge. 8 boxes.

## 2017-01-10 LAB — HM DIABETES EYE EXAM

## 2017-02-01 ENCOUNTER — Other Ambulatory Visit (INDEPENDENT_AMBULATORY_CARE_PROVIDER_SITE_OTHER): Payer: PPO

## 2017-02-01 ENCOUNTER — Ambulatory Visit (INDEPENDENT_AMBULATORY_CARE_PROVIDER_SITE_OTHER): Payer: PPO | Admitting: Family Medicine

## 2017-02-01 ENCOUNTER — Encounter: Payer: Self-pay | Admitting: Family Medicine

## 2017-02-01 VITALS — BP 124/60 | HR 59 | Temp 97.6°F | Ht 64.5 in | Wt 178.0 lb

## 2017-02-01 DIAGNOSIS — G63 Polyneuropathy in diseases classified elsewhere: Secondary | ICD-10-CM

## 2017-02-01 DIAGNOSIS — IMO0002 Reserved for concepts with insufficient information to code with codable children: Secondary | ICD-10-CM

## 2017-02-01 DIAGNOSIS — E785 Hyperlipidemia, unspecified: Secondary | ICD-10-CM

## 2017-02-01 DIAGNOSIS — I4891 Unspecified atrial fibrillation: Secondary | ICD-10-CM | POA: Diagnosis not present

## 2017-02-01 DIAGNOSIS — E538 Deficiency of other specified B group vitamins: Secondary | ICD-10-CM | POA: Diagnosis not present

## 2017-02-01 DIAGNOSIS — J0110 Acute frontal sinusitis, unspecified: Secondary | ICD-10-CM

## 2017-02-01 DIAGNOSIS — Z23 Encounter for immunization: Secondary | ICD-10-CM

## 2017-02-01 DIAGNOSIS — E1151 Type 2 diabetes mellitus with diabetic peripheral angiopathy without gangrene: Secondary | ICD-10-CM

## 2017-02-01 DIAGNOSIS — E1165 Type 2 diabetes mellitus with hyperglycemia: Secondary | ICD-10-CM | POA: Diagnosis not present

## 2017-02-01 LAB — LIPID PANEL
CHOL/HDL RATIO: 3
Cholesterol: 131 mg/dL (ref 0–200)
HDL: 50.2 mg/dL (ref >39.00)
LDL Cholesterol: 54 mg/dL (ref 0–99)
NonHDL: 81.22
TRIGLYCERIDES: 138 mg/dL (ref 0.0–149.0)
VLDL: 27.6 mg/dL (ref 0.0–40.0)

## 2017-02-01 MED ORDER — AZITHROMYCIN 250 MG PO TABS: ORAL_TABLET | ORAL | 0 refills | Status: DC

## 2017-02-01 MED ORDER — CYANOCOBALAMIN 1000 MCG/ML IJ SOLN
1000.0000 ug | Freq: Once | INTRAMUSCULAR | Status: AC
  Administered 2017-02-01: 1000 ug via INTRAMUSCULAR

## 2017-02-01 NOTE — Progress Notes (Signed)
Theresa Harrington - 61 y.o. female MRN [id number]  Date of birth: [birth year]  SUBJECTIVE:  Including CC & ROS.  Chief Complaint  Patient presents with  . Sinusitis    nasal congestion for 3 weeks. patient states that she has been afebrile, complains of some coughing that is not productive. States mucus is yellow when she blows her nase.     Theresa Harrington a 61 year old female presenting with sinus congestion, following up for hyperlipidemia, afib and B-12 deficiency following bariatric surgery. She reports her sinus symptoms have been ongoing for 2 weeks now. She denies any fevers or disease pain. She is having some fullness in her ears. She denies any sick contacts. She feels like her symptoms got better and then worse again. She has tried some over-the-counter medications. She denies any travel recently.  She takes zetia for her cholesterol. She reports she needs blood work for this today.  She is on Xarelto for atrial fibrillation. Has been taking this for 2 months. She is unclear on how long she will need to take this going forward.  She has a history of LAP-BAND procedure and has had B-12 injections for this. She reports needing a B-12 injection.     Review of Systems  Constitutional: Negative for fever.  HENT: Positive for sinus pressure.   Respiratory: Negative for shortness of breath.   Musculoskeletal: Negative for gait problem.  Neurological: Negative for weakness and numbness.    HISTORY: Past Medical, Surgical, Social, and Family History Reviewed & Updated per EMR.   Pertinent Historical Findings include:  Past Medical History:  Diagnosis Date  . Anemia   . Anxiety   . Asymptomatic cholelithiasis   . Atherosclerosis of aorta ([place])   . GERD (gastroesophageal reflux disease)   . Hypertension   . Hypothyroidism    no meds  . PAD (peripheral artery disease) ([place])   . Peripheral arterial occlusive disease (HCC)    lower extremities  . Pneumonia   . PONV (postoperative nausea  and vomiting)   . Right ureteral stone   . Type 2 diabetes mellitus ([place])   . Wears glasses     Past Surgical History:  Procedure Laterality Date  . AMPUTATION Left 07/16/2014   Procedure: LEFT SECOND TOE AMPUTATION;  Surgeon: Serafina Mitchell, MD;  Location: [place];  Service: Vascular;  Laterality: Left;  With Nerve block  . AUGMENTATION MAMMAPLASTY    . BELPHAROPTOSIS REPAIR     eyelid lift  . COMBINED AUGMENTATION MAMMAPLASTY AND ABDOMINOPLASTY  2009   W/  BILATERAL  THIGH LIFT  . CYSTO/  RIGHT URETERAL STENT PLACEMENT  12-30-2010  . CYSTOSCOPY WITH RETROGRADE PYELOGRAM, URETEROSCOPY AND STENT PLACEMENT Right 10/07/2013   Procedure: CYSTOSCOPY WITH RETROGRADE PYELOGRAM, right URETEROSCOPY AND STENT PLACEMENT, stone extraction;  Surgeon: Arvil Persons, MD;  [facility];  Service: Urology;  Laterality: Right;  . CYSTOSCOPY WITH STENT PLACEMENT Right 06/04/2013   Procedure: CYSTOSCOPY WITH STENT PLACEMENT;  Surgeon: Franchot Gallo, MD;  [facility];  Service: Urology;  Laterality: Right;  . DILATATION & CURETTAGE/HYSTEROSCOPY WITH MYOSURE N/A 04/27/2015   Procedure: DILATATION & CURETTAGE/HYSTEROSCOPY WITH MYOSURE;  Surgeon: Terrance Mass, MD;  Location: [facility];  Service: Gynecology;  Laterality: N/A;  . ENDARTERECTOMY FEMORAL Left 06/08/2014   Procedure: Left Leg Common Femoral and External Iliac  Endartarectomy with patch Angioplasty;  Surgeon: Rosetta Posner, MD;  Location: [place];  Service: Vascular;  Laterality: Left;  . EXTRACORPOREAL SHOCK WAVE  LITHOTRIPSY Right 08-04-2013//   06-23-2013//   01-16-2011  . FEMORAL-POPLITEAL BYPASS GRAFT Left 06/08/2014   Procedure: Left Leg Femoral -Popliteal Bypass Graft;  Surgeon: Rosetta Posner, MD;  Location: [facility] [place];  Service: Vascular;  Laterality: Left;  . FEMORAL-POPLITEAL BYPASS GRAFT Left 06/09/2014   Procedure: Left Femoral and Popliteal Exposure; Left Femoral to Anterior Tibial Bypass Graft using Propaten 2mm by 80cm  Goretex Graft; Left Tibial Endarterectomy; Left Femoraland Popliteal Thrombectomy ;  Surgeon: Serafina Mitchell, MD;  Location: [facility];  Service: Vascular;  Laterality: Left;  . HOLMIUM LASER APPLICATION Right 08/02/3152   Procedure: HOLMIUM LASER APPLICATION;  Surgeon: Arvil Persons, MD;  Location: [facility];  Service: Urology;  Laterality: Right;  . KIDNEY STONE SURGERY  April 2015   1-2 stones  . LAPAROSCOPIC GASTRIC BANDING  05-29-2005  . LITHOTRIPSY  2-3 times  . LOWER EXTREMITY ANGIOGRAM N/A 06/04/2014   Procedure: LOWER EXTREMITY ANGIOGRAM;  Surgeon: Serafina Mitchell, MD;  Location: [facility];  Service: Cardiovascular;  Laterality: N/A;  . ORIF FIFTH METACARPAL [place]  RIGHT HAND  04-21-2002  . REVISION AND RE-SITING LAP-BAND PORT  04-08-2010   W/  UPPER EGD  . RIGHT KNEE PATELLECTOMY W/ REPAIR OF EXTENSOR MECHANISM  04-14-2002  . Toenail removed Left Jan. 21, 2016   2nd toenail-  Dr. Barkley Bruns    Allergies  Allergen Reactions  . Amlodipine Swelling  . Atorvastatin Other (See Comments)    Reaction:  Muscle aches   . Ampicillin Nausea And Vomiting and Other (See Comments)  . Codeine Nausea And Vomiting    Ask patient  . Lisinopril Cough       . Penicillins Nausea And Vomiting and Other (See Comments)  . Januvia [Sitagliptin] Nausea And Vomiting    Family History  Problem Relation Age of Onset  . Diabetes Father   . Heart disease Father   . Deep vein thrombosis Father   . Hyperlipidemia Father   . Diabetes Sister   . Heart disease Sister   . Deep vein thrombosis Sister   . Hyperlipidemia Sister   . Diabetes Brother   . Heart disease Brother   . Cancer Brother   . Hyperlipidemia Brother   . Colon cancer Maternal Aunt   . Diabetes Brother   . Diabetes Brother   . Kidney disease Mother   . Hyperlipidemia Mother   . Other Mother        AAA   and    Amputation  . Arthritis Other   . Cancer Other        colon  . Hypertension Other   . Stroke Other       Social History   Social History  . Marital status: Single    Spouse name: N/A  . Number of children: N/A  . 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 Smoking status: Former Smoker    Packs/day: 1.00    Years: 28.00    Types: Cigarettes    Quit date: 09/05/2001  . Smokeless tobacco: Never Used  . Alcohol use No  . Drug use: No  . Sexual activity: Yes   Other Topics Concern  . Not on file   Social History Narrative   Regular exercise- yes     PHYSICAL EXAM:  VS: BP 124/60 (BP Location: Left Arm, Patient Position: Sitting, Cuff Size: Normal)   Pulse [name])  59   Temp 97.6 F (36.4 C) (Oral)   Ht 5' 4.5" (1.638 m)   Wt 178 lb (80.7 kg)   LMP  (LMP Unknown)   SpO2 98%   BMI 30.08 kg/m  Physical Exam Gen: NAD, alert, cooperative with exam, well-appearing ENT: normal lips, normal nasal mucosa, tympanic memories clear and intact bilaterally, no cervical adenopathy, oropharynx clear, no frontal or maxillary sinus tenderness. Eye: normal EOM, normal conjunctiva and lids CV:  no edema, +2 pedal pulses, S1-S2, regular rhythm   Resp: no accessory muscle use, non-labored, clear to auscultation bilaterally, no crackles or wheezes  Skin: no rashes, no areas of induration  Neuro: normal tone, normal sensation to touch Psych:  normal insight, alert and oriented MSK: Normal gait, normal strength      ASSESSMENT & PLAN:   Atrial fibrillation with RVR ([place]) She is provided samples of Xarelto 20 mg today.  Acute non-recurrent frontal sinusitis Does not appear to be severe in nature. It has been ongoing for a couple of weeks. - Azithromycin - Supportive care measures advised. 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  - B-12 injection provided today.  Hyperlipidemia with target LDL less than 100 Currently controlled - Continue medications - Lipid panel  today

## 2017-02-01 NOTE — Patient Instructions (Signed)
Thank you for coming in,   We will call you with the results from today.    Please feel free to call with any questions or concerns at any time, at [phone]. --Dr. Schmitz  

## 2017-02-02 DIAGNOSIS — J0110 Acute frontal sinusitis, unspecified: Secondary | ICD-10-CM | POA: Insufficient documentation

## 2017-02-02 NOTE — Assessment & Plan Note (Signed)
History of LAP-BAND surgery.  - B-12 injection provided today.

## 2017-02-02 NOTE — Assessment & Plan Note (Signed)
She is provided samples of Xarelto 20 mg today.

## 2017-02-02 NOTE — Assessment & Plan Note (Signed)
Currently controlled - Continue medications - Lipid panel today

## 2017-02-02 NOTE — Assessment & Plan Note (Signed)
Does not appear to be severe in nature. It has been ongoing for a couple of weeks. - Azithromycin - Supportive care measures advised. - Follow-up as needed

## 2017-02-13 DIAGNOSIS — H401121 Primary open-angle glaucoma, left eye, mild stage: Secondary | ICD-10-CM | POA: Diagnosis not present

## 2017-02-13 DIAGNOSIS — H401112 Primary open-angle glaucoma, right eye, moderate stage: Secondary | ICD-10-CM | POA: Diagnosis not present

## 2017-02-19 ENCOUNTER — Telehealth: Payer: Self-pay | Admitting: Internal Medicine

## 2017-02-19 DIAGNOSIS — E1151 Type 2 diabetes mellitus with diabetic peripheral angiopathy without gangrene: Secondary | ICD-10-CM

## 2017-02-19 NOTE — Telephone Encounter (Addendum)
Patient would like to speak with Stefannie. All she would give me was she is now in the donut hole.

## 2017-02-19 NOTE — Telephone Encounter (Addendum)
xarelto and novolog is too expensive.   Patient assistance form completed for the novolog.   Left patient a detailed message that sample and form is ready for pick up.

## 2017-02-20 MED ORDER — INSULIN ASPART 100 UNIT/ML FLEXPEN: 20.0000 [IU] | PEN_INJECTOR | Freq: Three times a day (TID) | SUBCUTANEOUS | 11 refills | Status: DC

## 2017-02-21 ENCOUNTER — Other Ambulatory Visit: Payer: Self-pay

## 2017-02-21 ENCOUNTER — Telehealth: Payer: Self-pay | Admitting: *Deleted

## 2017-02-21 MED ORDER — RIVAROXABAN 20 MG PO TABS: 20.0000 mg | ORAL_TABLET | Freq: Every day | ORAL | 3 refills | Status: DC

## 2017-02-21 NOTE — Telephone Encounter (Signed)
**Note De-Identified Gioia Ranes Obfuscation** I have completed the provider part of the application and printed a Xarelto RX. Dr Marlou Porch has signed both and I have placed them in the yellow folder waiting on the pts part of application.

## 2017-02-21 NOTE — Telephone Encounter (Signed)
Patient called and stated that she is in the donut hole and she is unable to afford the xarelto. I mentioned patient assistance and she is willing to apply. She is aware that I will place samples and an application at the front desk. Patient verbalized her understanding and appreciation.

## 2017-02-22 MED ORDER — PEN NEEDLES 32G X 4 MM MISC: 1.0000 "pen " | Freq: Four times a day (QID) | 3 refills | Status: DC

## 2017-02-22 MED ORDER — EZETIMIBE 10 MG PO TABS: 10.0000 mg | ORAL_TABLET | Freq: Every day | ORAL | 3 refills | Status: DC

## 2017-02-22 NOTE — Addendum Note (Signed)
Addended by: Karle Barr on: 02/22/2017 09:47 AM   Modules accepted: Orders

## 2017-02-27 NOTE — Telephone Encounter (Signed)
The pt returned her Pt assistance application. I have faxed all to [facility] and [employer].

## 2017-03-15 ENCOUNTER — Ambulatory Visit: Payer: PPO | Admitting: Internal Medicine

## 2017-03-15 ENCOUNTER — Encounter: Payer: Self-pay | Admitting: Internal Medicine

## 2017-03-15 VITALS — BP 120/78 | HR 70 | Ht 64.5 in | Wt 181.6 lb

## 2017-03-15 DIAGNOSIS — E1165 Type 2 diabetes mellitus with hyperglycemia: Secondary | ICD-10-CM | POA: Diagnosis not present

## 2017-03-15 DIAGNOSIS — E785 Hyperlipidemia, unspecified: Secondary | ICD-10-CM | POA: Diagnosis not present

## 2017-03-15 DIAGNOSIS — E1151 Type 2 diabetes mellitus with diabetic peripheral angiopathy without gangrene: Secondary | ICD-10-CM | POA: Diagnosis not present

## 2017-03-15 DIAGNOSIS — IMO0002 Reserved for concepts with insufficient information to code with codable children: Secondary | ICD-10-CM

## 2017-03-15 NOTE — Progress Notes (Signed)
Follow Patient ID: Theresa Harrington, female   DOB: [birth year], 61 y.o.   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 Last visit 4 months ago.  Her sister died recently Theresa Harrington - also my pt).  She is in the doughnut hole >> stopped Novolog for 1 mo. Now back on it for last 2 weeks.  Last hemoglobin A1c was higher: Lab Results  Component Value Date   HGBA1C 9.9 11/08/2016   HGBA1C 9.3 08/30/2016   HGBA1C 10.0 (H) 06/05/2016   Pt was on a regimen of: - Metformin 1000 mg 2x a day, with meals - Amaryl 2 mg before b'fast - Levemir 110 units at bedtime - Novolog 0-10 units 3x a day units 3x a day, before meals She stopped the Bydureon >> did not help.  At last visit, we changed to: - Levemir 80 units at bedtime - Novolog as follows: - 14 >> 20 units before a smaller meal - 18 >> 24 units before a larger meals - Metformin 1000 mg 2x a day with meals.  Pt checks her sugars 2-5x a day: - am: 47-137 >> 72-191, 204 >> 58, 73-164, 179 - 2h after b'fast: n/c >> 104-198 - before lunch: n/c >> 118-142 - 2h after lunch: n/c >> 153, 213 - before dinner: n/c >> 110-181, 192 - 2h after dinner: n/c >> 146, 233 - bedtime: n/c >> 213-300 >> 84-199, 233, 299 - nighttime: 200-346 >> n/c >> 193 >>  189, 220 Lowest sugar was 47 >> 72 >> 58; she has hypoglycemia awareness at 61.  Highest sugar was 346 >> 300s >> 299.  Glucometer: Precision Extra  Pt's meals are: - Breakfast: 1 boiled egg + 1 cheese toast + coffee - Lunch: salad or sausage croissant, etc. - Dinner: differs >> hamburger and salad, 1/2 sodas - Snacks: none Reg. Sodas, juice >> she reduced them after last visit >> now still drinking 1 x a day.  - No CKD, last BUN/creatinine:  Lab Results  Component Value Date   BUN 16 12/18/2016   BUN 20  11/13/2016   CREATININE 0.74 12/18/2016   CREATININE 1.10 (H) 11/13/2016  On Losartan. - last set of lipids: Lab Results  Component Value Date   CHOL 131 02/01/2017   HDL 50.20 02/01/2017   LDLCALC 54 02/01/2017   LDLDIRECT 160.2 03/01/2010   TRIG 138.0 02/01/2017   CHOLHDL 3 02/01/2017  On, Pravastatin.Zetia - last eye exam:0/2018 >> No DR.  - no numbness and tingling in her feet.  Of note, she has a history of lap band sx 2007, and following breast and thigh lift and abdominoplasty in 2009. She also has HTN, HL. Dx of paroxysmal atrial fibrillation since 10/2016. She was started on Xarelto and Cardizem.  ROS: Constitutional: no weight gain/no weight loss, no fatigue, no subjective hyperthermia, no subjective hypothermia Eyes: no blurry vision, no xerophthalmia ENT: no sore throat, no nodules palpated in throat, no dysphagia, no odynophagia, no hoarseness Cardiovascular: no CP/no SOB/no palpitations/no leg swelling Respiratory: no cough/no SOB/no wheezing Gastrointestinal: no N/no V/no D/no C/no acid reflux Musculoskeletal: no muscle aches/no joint aches Skin: no rashes, no hair loss Neurological: no tremors/no numbness/no tingling/no dizziness  I reviewed pt's medications, allergies, PMH, social hx, family hx, and changes were documented in the history of present illness. Otherwise, unchanged  from my initial visit note.   Past Medical History:  Diagnosis Date  . Anemia   . Anxiety   . Asymptomatic cholelithiasis   . Atherosclerosis of aorta ([place])   . GERD (gastroesophageal reflux disease)   . Hypertension   . Hypothyroidism    no meds  . PAD (peripheral artery disease) ([place])   . Peripheral arterial occlusive disease ([facility])    lower extremities  . Pneumonia   . PONV (postoperative nausea and vomiting)   . Right ureteral stone   . Type 2 diabetes mellitus ([place])   . Wears glasses    Past Surgical History:  Procedure Laterality Date  . AMPUTATION Left 07/16/2014    Procedure: LEFT SECOND TOE AMPUTATION;  Surgeon: Serafina Mitchell, MD;  Location: [facility];  Service: Vascular;  Laterality: Left;  With Nerve block  . AUGMENTATION MAMMAPLASTY    . BELPHAROPTOSIS REPAIR     eyelid lift  . COMBINED AUGMENTATION MAMMAPLASTY AND ABDOMINOPLASTY  2009   W/  BILATERAL  THIGH LIFT  . CYSTO/  RIGHT URETERAL STENT PLACEMENT  12-30-2010  . CYSTOSCOPY WITH RETROGRADE PYELOGRAM, URETEROSCOPY AND STENT PLACEMENT Right 10/07/2013   Procedure: CYSTOSCOPY WITH RETROGRADE PYELOGRAM, right URETEROSCOPY AND STENT PLACEMENT, stone extraction;  Surgeon: Arvil Persons, MD;  Location: [facility];  Service: Urology;  Laterality: Right;  . CYSTOSCOPY WITH STENT PLACEMENT Right 06/04/2013   Procedure: CYSTOSCOPY WITH STENT PLACEMENT;  Surgeon: Franchot Gallo, MD;  Location: [facility];  Service: Urology;  Laterality: Right;  . DILATATION & CURETTAGE/HYSTEROSCOPY WITH MYOSURE N/A 04/27/2015   Procedure: DILATATION & CURETTAGE/HYSTEROSCOPY WITH MYOSURE;  Surgeon: Terrance Mass, MD;  Location: [facility];  Service: Gynecology;  Laterality: N/A;  . ENDARTERECTOMY FEMORAL Left 06/08/2014   Procedure: Left Leg Common Femoral and External Iliac  Endartarectomy with patch Angioplasty;  Surgeon: Rosetta Posner, MD;  Location: [facility];  Service: Vascular;  Laterality: Left;  . EXTRACORPOREAL SHOCK WAVE LITHOTRIPSY Right 08-04-2013//   06-23-2013//   01-16-2011  . FEMORAL-POPLITEAL BYPASS GRAFT Left 06/08/2014   Procedure: Left Leg Femoral -Popliteal Bypass Graft;  Surgeon: Rosetta Posner, MD;  Location: [facility];  Service: Vascular;  Laterality: Left;  . FEMORAL-POPLITEAL BYPASS GRAFT Left 06/09/2014   Procedure: Left Femoral and Popliteal Exposure; Left Femoral to Anterior Tibial Bypass Graft using Propaten 50mm by 80cm Goretex Graft; Left Tibial Endarterectomy; Left Femoraland Popliteal Thrombectomy ;  Surgeon: Serafina Mitchell, MD;  Location: [place];  Service: Vascular;  Laterality: Left;  . HOLMIUM LASER  APPLICATION Right 07/06/3426   Procedure: HOLMIUM LASER APPLICATION;  Surgeon: Arvil Persons, MD;  Location: [facility];  Service: Urology;  Laterality: Right;  . KIDNEY STONE SURGERY  April 2015   1-2 stones  . LAPAROSCOPIC GASTRIC BANDING  05-29-2005  . LITHOTRIPSY  2-3 times  . LOWER EXTREMITY ANGIOGRAM N/A 06/04/2014   Procedure: LOWER EXTREMITY ANGIOGRAM;  Surgeon: Serafina Mitchell, MD;  Location[facility];  Service: Cardiovascular;  Laterality: N/A;  . ORIF FIFTH METACARPAL [place]  RIGHT HAND  04-21-2002  . REVISION AND RE-SITING LAP-BAND PORT  04-08-2010   W/  UPPER EGD  . RIGHT KNEE PATELLECTOMY W/ REPAIR OF EXTENSOR MECHANISM  04-14-2002  . Toenail removed Left Jan. 21, 2016   2nd toenail-  Dr. Barkley Bruns   Social History   Socioeconomic History  . Marital status: Single    Spouse name: Not on file  . Number of children: Not on file  .  Years of education: Not on file  . Highest education level: Not on file  Social Needs  . Financial resource strain: Not on file  . Food insecurity - worry: Not on file  . Food insecurity - inability: Not on file  . Transportation needs - medical: Not on file  . Transportation needs - non-medical: Not on file  Occupational History  . 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 Smoking status: Former Smoker    Packs/day: 1.00    Years: 28.00    Pack years: 28.00    Types: Cigarettes    Last attempt to quit: 09/05/2001    Years since quitting: 15.5  . Smokeless tobacco: Never Used  Substance and Sexual Activity  . Alcohol use: No    Alcohol/week: 0.0 oz  . Drug use: No  . Sexual activity: Yes  Other Topics Concern  . Not on file  Social History Narrative   Regular exercise- yes   Current Outpatient Medications on File Prior to Visit  Medication Sig Dispense Refill  . azithromycin (ZITHROMAX) 250 MG tablet Take 500 mg the first day then 250 mg the next 4 days. Total of 5 days. 6 tablet 0  . Continuous  Blood Gluc Receiver (FREESTYLE LIBRE READER) DEVI 1 Device by Does not apply route 3 (three) times daily. 1 Device 1  . Continuous Blood Gluc Sensor (FREESTYLE LIBRE SENSOR SYSTEM) MISC 1 Device by Does not apply route every 30 (thirty) days. 3 each 11  . diltiazem (CARDIZEM CD) 120 MG 24 hr capsule Take 1 capsule (120 mg total) by mouth daily. 30 capsule 11  . ezetimibe (ZETIA) 10 MG tablet Take 1 tablet (10 mg total) by mouth daily. 90 tablet 3  . insulin aspart (NOVOLOG FLEXPEN) 100 UNIT/ML FlexPen Inject 20-24 Units into the skin 3 (three) times daily with meals. 15 mL 11  . Insulin Detemir (LEVEMIR FLEXTOUCH) 100 UNIT/ML Pen Inject 80 Units into the skin daily at 10 pm. DX 250.72 30 mL 3  . Insulin Pen Needle (PEN NEEDLES) 32G X 4 MM MISC Inject 1 pen as directed 4 (four) times daily. Use to inject levemir or novolog as directed. 400 each 3  . losartan-hydrochlorothiazide (HYZAAR) 100-12.5 MG tablet TAKE 1 TABLET BY MOUTH ONCE DAILY 90 tablet 1  . magnesium oxide (MAG-OX) 400 (241.3 Mg) MG tablet TAKE 1 TABLET BY MOUTH ONCE DAILY 90 tablet 1  . magnesium oxide (MAG-OX) 400 MG tablet Take 1 tablet (400 mg total) by mouth daily. 90 tablet 1  . metFORMIN (GLUCOPHAGE) 1000 MG tablet TAKE ONE TABLET BY MOUTH TWICE DAILY WITH MEALS 180 tablet 1  . pravastatin (PRAVACHOL) 40 MG tablet TAKE ONE TABLET BY MOUTH ONCE DAILY 90 tablet 3  . rivaroxaban (XARELTO) 20 MG TABS tablet Take 1 tablet (20 mg total) by mouth daily with supper. 90 tablet 3   No current facility-administered medications on file prior to visit.    Allergies  Allergen Reactions  . Amlodipine Swelling  . Atorvastatin Other (See Comments)    Reaction:  Muscle aches   . Ampicillin Nausea And Vomiting and Other (See Comments)      . Codeine Nausea And Vomiting    Ask patient  . Lisinopril Cough       . Penicillins Nausea And Vomiting and Other (See Comments)      . Januvia [Sitagliptin] Nausea And Vomiting   Family History   Problem Relation Age of Onset  .  Diabetes Father   . Heart disease Father   . Deep vein thrombosis Father   . Hyperlipidemia Father   . Diabetes Sister   . Heart disease Sister   . Deep vein thrombosis Sister   . Hyperlipidemia Sister   . Diabetes Brother   . Heart disease Brother   . Cancer Brother   . Hyperlipidemia Brother   . Colon cancer Maternal Aunt   . Diabetes Brother   . Diabetes Brother   . Kidney disease Mother   . Hyperlipidemia Mother   . Other Mother        AAA   and    Amputation  . Arthritis Other   . Cancer Other        colon  . Hypertension Other   . Stroke Other    Pt has FH of DM in F,S, 3B.  PE: BP 120/78 (BP Location: Left Arm, Patient Position: Sitting, Cuff Size: Normal)   Pulse 70   Ht 5' 4.5" (1.638 m)   Wt 181 lb 9.6 oz (82.4 kg)   LMP  (LMP Unknown)   SpO2 98%   BMI 30.69 kg/m  Wt Readings from Last 3 Encounters:  03/15/17 181 lb 9.6 oz (82.4 kg)  02/01/17 178 lb (80.7 kg)  12/18/16 180 lb 3.2 oz (81.7 kg)   Constitutional: overweight, in NAD Eyes: PERRLA, EOMI, no exophthalmos ENT: moist mucous membranes, no thyromegaly, no cervical lymphadenopathy Cardiovascular: RRR, No MRG Respiratory: CTA B Gastrointestinal: abdomen soft, NT, ND, BS+ Musculoskeletal: no deformities, strength intact in all 4 Skin: moist, warm, no rashes Neurological: no tremor with outstretched hands, DTR normal in all 4  ASSESSMENT: 1. DM2, insulin-dependent, uncontrolled, with complications - PAD  - s/p R 2nd toe amputation 2016  2. HL  PLAN:  1. Patient with long-standing, uncontrolled DM. On Metformin + basal-bolus insulin regimen. - At last visit, we increased Novolog and discussed about cutting out juices and regular sodas. I also called in Freestyle Libre CGM for her >> she obtains this but she does not feel it is very accurate - we also discussed at last visit about not bolusing the full mealtime dose for a postprandial correction. - At this  visit, her sugars were much better before running out of NovoLog >> now restarted.  We discussed that we can start regular insulin if she is ever in the situation that she cannot afford her mealtime insulin. - Since she was doing better after we increased her NovoLog doses at last visit, I will not increase these, but I advised her to take a dose of NovoLog before lunch, which is a smaller meal, so we adjusted the dose down to 50% of the regular dose  - we also discussed about what to do in this situation when she forgets to take the insulin before meals - I suggested to:  Patient Instructions  Please continue:  - Metformin 1000 mg 2x a day with meals  - Levemir 80 units at bedtime - Novolog 20-24 units depending on the size of the meal - inject 15 min before meals. Please take 10 units Novolog before lunch.  Please come back for a follow-up appointment in 3 months.  - today, HbA1c is 9.1% (better) - continue checking sugars at different times of the day - check 3x a day, rotating checks - advised for yearly eye exams >> she is UTD - Return to clinic in 3 mo with sugar log   2. HL  - Reviewed  her recent lipid panel - Excellent improvement in her LDL on pravastatin and Zetia - no side effects from the above  Philemon Kingdom, MD PhD [facility]ocr[facility]

## 2017-03-15 NOTE — Patient Instructions (Addendum)
Please continue:  - Metformin 1000 mg 2x a day with meals  - Levemir 80 units at bedtime - Novolog 20-24 units depending on the size of the meal - inject 15 min before meals. Please take 10 units Novolog before lunch.  Please come back for a follow-up appointment in 3 months.

## 2017-03-15 NOTE — Telephone Encounter (Signed)
Patient has picked up samples of everything that patient assistance set over.

## 2017-03-27 NOTE — Telephone Encounter (Signed)
Patient assistant set over more Levemir pens. Patient has been informed.

## 2017-04-03 ENCOUNTER — Telehealth: Payer: Self-pay | Admitting: Internal Medicine

## 2017-04-03 NOTE — Telephone Encounter (Signed)
Copied from [place] [id number]. Topic: General - Other >> Apr 03, 2017 11:11 AM Yvette Rack wrote: Reason for CRM: Tommi Rumps from prescription health [phone] option 2 wanted to know if we had gotten a fax for prescription Xarelto 20mg  if so it need to have the dosage and strength of medicine

## 2017-04-03 NOTE — Telephone Encounter (Signed)
This form has been faxed over today. 12/4.

## 2017-04-04 ENCOUNTER — Other Ambulatory Visit (INDEPENDENT_AMBULATORY_CARE_PROVIDER_SITE_OTHER): Payer: PPO

## 2017-04-04 ENCOUNTER — Encounter: Payer: Self-pay | Admitting: Internal Medicine

## 2017-04-04 ENCOUNTER — Ambulatory Visit: Payer: PPO | Admitting: Internal Medicine

## 2017-04-04 ENCOUNTER — Ambulatory Visit: Payer: PPO | Admitting: Family

## 2017-04-04 VITALS — BP 134/78 | HR 62 | Temp 98.0°F | Resp 16 | Ht 64.5 in | Wt 184.0 lb

## 2017-04-04 DIAGNOSIS — IMO0002 Reserved for concepts with insufficient information to code with codable children: Secondary | ICD-10-CM

## 2017-04-04 DIAGNOSIS — E538 Deficiency of other specified B group vitamins: Secondary | ICD-10-CM

## 2017-04-04 DIAGNOSIS — R42 Dizziness and giddiness: Secondary | ICD-10-CM

## 2017-04-04 DIAGNOSIS — E1151 Type 2 diabetes mellitus with diabetic peripheral angiopathy without gangrene: Secondary | ICD-10-CM | POA: Diagnosis not present

## 2017-04-04 DIAGNOSIS — E1165 Type 2 diabetes mellitus with hyperglycemia: Secondary | ICD-10-CM

## 2017-04-04 DIAGNOSIS — D508 Other iron deficiency anemias: Secondary | ICD-10-CM

## 2017-04-04 DIAGNOSIS — G63 Polyneuropathy in diseases classified elsewhere: Secondary | ICD-10-CM

## 2017-04-04 LAB — CBC WITH DIFFERENTIAL/PLATELET
BASOS ABS: 0.1 10*3/uL (ref 0.0–0.1)
Basophils Relative: 1 % (ref 0.0–3.0)
EOS ABS: 0.1 10*3/uL (ref 0.0–0.7)
Eosinophils Relative: 1 % (ref 0.0–5.0)
HCT: 36.9 % (ref 36.0–46.0)
Hemoglobin: 11.7 g/dL — ABNORMAL LOW (ref 12.0–15.0)
LYMPHS ABS: 2.9 10*3/uL (ref 0.7–4.0)
Lymphocytes Relative: 25.1 % (ref 12.0–46.0)
MCHC: 31.7 g/dL (ref 30.0–36.0)
MCV: 86 fl (ref 78.0–100.0)
MONO ABS: 0.6 10*3/uL (ref 0.1–1.0)
MONOS PCT: 5 % (ref 3.0–12.0)
NEUTROS PCT: 67.9 % (ref 43.0–77.0)
Neutro Abs: 7.7 10*3/uL (ref 1.4–7.7)
Platelets: 288 10*3/uL (ref 150.0–400.0)
RBC: 4.29 Mil/uL (ref 3.87–5.11)
RDW: 14.2 % (ref 11.5–15.5)
WBC: 11.4 10*3/uL — ABNORMAL HIGH (ref 4.0–10.5)

## 2017-04-04 LAB — MICROALBUMIN / CREATININE URINE RATIO
CREATININE, U: 82.4 mg/dL
MICROALB/CREAT RATIO: 5.4 mg/g (ref 0.0–30.0)
Microalb, Ur: 4.4 mg/dL — ABNORMAL HIGH (ref 0.0–1.9)

## 2017-04-04 LAB — HEMOGLOBIN A1C: HEMOGLOBIN A1C: 9.9 % — AB (ref 4.6–6.5)

## 2017-04-04 MED ORDER — PROMETHAZINE HCL 12.5 MG PO TABS: 12.5000 mg | ORAL_TABLET | Freq: Four times a day (QID) | ORAL | 0 refills | Status: DC | PRN

## 2017-04-04 MED ORDER — CYANOCOBALAMIN 1000 MCG/ML IJ SOLN
1000.0000 ug | Freq: Once | INTRAMUSCULAR | Status: AC
  Administered 2017-04-04: 1000 ug via INTRAMUSCULAR

## 2017-04-04 NOTE — Patient Instructions (Signed)
Vertigo Vertigo means that you feel like you are moving when you are not. Vertigo can also make you feel like things around you are moving when they are not. This feeling can come and go at any time. Vertigo often goes away on its own. Follow these instructions at home:  Avoid making fast movements.  Avoid driving.  Avoid using heavy machinery.  Avoid doing any task or activity that might cause danger to you or other people if you would have a vertigo attack while you are doing it.  Sit down right away if you feel dizzy or have trouble with your balance.  Take over-the-counter and prescription medicines only as told by your doctor.  Follow instructions from your doctor about which positions or movements you should avoid.  Drink enough fluid to keep your pee (urine) clear or pale yellow.  Keep all follow-up visits as told by your doctor. This is important. Contact a doctor if:  Medicine does not help your vertigo.  You have a fever.  Your problems get worse or you have new symptoms.  Your family or friends see changes in your behavior.  You feel sick to your stomach (nauseous) or you throw up (vomit).  You have a "pins and needles" feeling or you are numb in part of your body. Get help right away if:  You have trouble moving or talking.  You are always dizzy.  You pass out (faint).  You get very bad headaches.  You feel weak or have trouble using your hands, arms, or legs.  You have changes in your hearing.  You have changes in your seeing (vision).  You get a stiff neck.  Bright light starts to bother you. This information is not intended to replace advice given to you by your health care provider. Make sure you discuss any questions you have with your health care provider. Document Released: 01/25/2008 Document Revised: 09/23/2015 Document Reviewed: 08/10/2014 Elsevier Interactive Patient Education  2018 [employer]  

## 2017-04-04 NOTE — Progress Notes (Signed)
Subjective:  Patient ID: Theresa Harrington, female    DOB: 1956/04/14  Age: 61 y.o. MRN: [id number]  CC: Anemia and Diabetes   HPI Theresa Harrington presents for concerns about an episode of vertigo that occurred 3 days ago.  She had eaten dinner and was in her home when she suddenly felt lightheaded, dizziness, and vertiginous.  She took a dose of Phenergan and went to bed and says the symptoms have gradually improved.  She did not experience a headache or vomiting but she did feel nauseous.  She denies numbness, weakness, tingling in her face or extremities.  She has not had a B12 injection in 3 months and she requests an injection today.  Outpatient Medications Prior to Visit  Medication Sig Dispense Refill  . Continuous Blood Gluc Receiver (FREESTYLE LIBRE READER) DEVI 1 Device by Does not apply route 3 (three) times daily. 1 Device 1  . Continuous Blood Gluc Sensor (FREESTYLE LIBRE SENSOR SYSTEM) MISC 1 Device by Does not apply route every 30 (thirty) days. 3 each 11  . diltiazem (CARDIZEM CD) 120 MG 24 hr capsule Take 1 capsule (120 mg total) by mouth daily. 30 capsule 11  . ezetimibe (ZETIA) 10 MG tablet Take 1 tablet (10 mg total) by mouth daily. 90 tablet 3  . insulin aspart (NOVOLOG FLEXPEN) 100 UNIT/ML FlexPen Inject 20-24 Units into the skin 3 (three) times daily with meals. 15 mL 11  . Insulin Detemir (LEVEMIR FLEXTOUCH) 100 UNIT/ML Pen Inject 80 Units into the skin daily at 10 pm. DX 250.72 30 mL 3  . Insulin Pen Needle (PEN NEEDLES) 32G X 4 MM MISC Inject 1 pen as directed 4 (four) times daily. Use to inject levemir or novolog as directed. 400 each 3  . losartan-hydrochlorothiazide (HYZAAR) 100-12.5 MG tablet TAKE 1 TABLET BY MOUTH ONCE DAILY 90 tablet 1  . magnesium oxide (MAG-OX) 400 (241.3 Mg) MG tablet TAKE 1 TABLET BY MOUTH ONCE DAILY 90 tablet 1  . metFORMIN (GLUCOPHAGE) 1000 MG tablet TAKE ONE TABLET BY MOUTH TWICE DAILY WITH MEALS 180 tablet 1  . pravastatin (PRAVACHOL) 40 MG  tablet TAKE ONE TABLET BY MOUTH ONCE DAILY 90 tablet 3  . rivaroxaban (XARELTO) 20 MG TABS tablet Take 1 tablet (20 mg total) by mouth daily with supper. 90 tablet 3  . magnesium oxide (MAG-OX) 400 MG tablet Take 1 tablet (400 mg total) by mouth daily. 90 tablet 1   No facility-administered medications prior to visit.     ROS Review of Systems  Constitutional: Negative for appetite change, diaphoresis and fatigue.  HENT: Negative.   Eyes: Negative for visual disturbance.  Respiratory: Negative for chest tightness, shortness of breath and wheezing.   Cardiovascular: Negative for chest pain, palpitations and leg swelling.  Gastrointestinal: Positive for nausea. Negative for abdominal pain, diarrhea and vomiting.  Endocrine: Negative.   Genitourinary: Negative.  Negative for difficulty urinating.  Musculoskeletal: Negative.  Negative for back pain, myalgias and neck pain.  Skin: Negative for rash.  Allergic/Immunologic: Negative.   Neurological: Positive for dizziness and light-headedness. Negative for syncope, speech difficulty, weakness, numbness and headaches.  Hematological: Negative for adenopathy. Does not bruise/bleed easily.  Psychiatric/Behavioral: Negative.     Objective:  BP 134/78   Pulse 62   Temp 98 F (36.7 C) (Oral)   Resp 16   Ht 5' 4.5" (1.638 m)   Wt 184 lb (83.5 kg)   LMP  (LMP Unknown)   SpO2 98%   BMI 31.10 kg/m  BP Readings from Last 3 Encounters:  04/04/17 134/78  03/15/17 120/78  02/01/17 124/60    Wt Readings from Last 3 Encounters:  04/04/17 184 lb (83.5 kg)  03/15/17 181 lb 9.6 oz (82.4 kg)  02/01/17 178 lb (80.7 kg)    Physical Exam  Constitutional: She is oriented to person, place, and time. No distress.  HENT:  Mouth/Throat: Oropharynx is clear and moist. No oropharyngeal exudate.  Eyes: Conjunctivae and EOM are normal. Pupils are equal, round, and reactive to light. Right eye exhibits no discharge. Left eye exhibits no discharge. No  scleral icterus. Right eye exhibits normal extraocular motion and no nystagmus. Left eye exhibits normal extraocular motion and no nystagmus.  Neck: Normal range of motion. Neck supple. No JVD present. No thyromegaly present.  Cardiovascular: Normal rate, regular rhythm and intact distal pulses.  No murmur heard. Pulmonary/Chest: Effort normal and breath sounds normal. No respiratory distress. She has no wheezes. She has no rales.  Abdominal: Soft. Bowel sounds are normal. She exhibits no mass. There is no tenderness. There is no guarding.  Musculoskeletal: Normal range of motion. She exhibits no edema, tenderness or deformity.  Neurological: She is alert and oriented to person, place, and time. She has normal strength and normal reflexes. She displays no atrophy and no tremor. No cranial nerve deficit or sensory deficit. She exhibits normal muscle tone. She displays a negative Romberg sign. She displays no seizure activity. Coordination and gait normal. She displays no Babinski's sign on the right side. She displays no Babinski's sign on the left side.  Skin: Skin is warm and dry. No rash noted. She is not diaphoretic. No erythema. No pallor.  Vitals reviewed.   Lab Results  Component Value Date   WBC 11.4 (H) 04/04/2017   HGB 11.7 (L) 04/04/2017   HCT 36.9 04/04/2017   PLT 288.0 04/04/2017   GLUCOSE 245 (H) 04/04/2017   CHOL 131 02/01/2017   TRIG 138.0 02/01/2017   HDL 50.20 02/01/2017   LDLDIRECT 160.2 03/01/2010   LDLCALC 54 02/01/2017   ALT 10 06/05/2016   AST 10 06/05/2016   NA 139 04/04/2017   K 3.9 04/04/2017   CL 102 04/04/2017   CREATININE 0.76 04/04/2017   BUN 13 04/04/2017   CO2 29 04/04/2017   TSH 0.602 11/13/2016   INR 1.05 07/16/2014   HGBA1C 9.9 (H) 04/04/2017   MICROALBUR 4.4 (H) 04/04/2017    Mm Screening Breast Tomo Bilateral  Result Date: 05/26/2016 CLINICAL DATA:  Screening. History of bilateral breast lift surgery. EXAM: 2D DIGITAL SCREENING BILATERAL  MAMMOGRAM WITH CAD AND ADJUNCT TOMO COMPARISON:  Previous exam(s). ACR Breast Density Category b: There are scattered areas of fibroglandular density. FINDINGS: There are stable postsurgical changes within each breast. There are no findings suspicious for malignancy within either breast. Images were processed with CAD. IMPRESSION: No mammographic evidence of malignancy. A result letter of this screening mammogram will be mailed directly to the patient. RECOMMENDATION: Screening mammogram in one year. (Code:SM-B-01Y) BI-RADS CATEGORY  2: Benign. Electronically Signed   By: Franki Cabot M.D.   On: 05/26/2016 13:04    Assessment & Plan:   Theresa Harrington was seen today for anemia and diabetes.  Diagnoses and all orders for this visit:  Vitamin B12 deficiency neuropathy ([place])- will restart B12 injections monthly -     CBC with Differential/Platelet; Future -     cyanocobalamin ((VITAMIN B-12)) injection 1,000 mcg  Uncontrolled type 2 diabetes mellitus with peripheral artery disease ([place])- Her  A1c remains at 9.9%.  Will continue the current regimen and I have asked her follow-up with endocrinology. -     Basic metabolic panel; Future -     Hemoglobin A1c; Future -     Microalbumin / creatinine urine ratio; Future  Other iron deficiency anemia- Her hemoglobin is down to 11.7.  Will start iron replacement therapy. -     CBC with Differential/Platelet; Future  Vertigo- She had an episode of vertigo with no neurological compromise.  Her neurologic exam today is normal.  She has responded well to Phenergan so will continue this as needed.  She will let me know if she develops any persistent or worsening symptoms. -     promethazine (PHENERGAN) 12.5 MG tablet; Take 1 tablet (12.5 mg total) by mouth every 6 (six) hours as needed for nausea or vomiting.   I am having Theresa Harrington start on promethazine and ferrous sulfate. I am also having her maintain her metFORMIN, pravastatin, diltiazem, Insulin Detemir, FREESTYLE  LIBRE READER, FREESTYLE LIBRE SENSOR SYSTEM, losartan-hydrochlorothiazide, magnesium oxide, insulin aspart, rivaroxaban, Pen Needles, and ezetimibe. We administered cyanocobalamin.  Meds ordered this encounter  Medications  . promethazine (PHENERGAN) 12.5 MG tablet    Sig: Take 1 tablet (12.5 mg total) by mouth every 6 (six) hours as needed for nausea or vomiting.    Dispense:  30 tablet    Refill:  0  . cyanocobalamin ((VITAMIN B-12)) injection 1,000 mcg  . ferrous sulfate 325 (65 FE) MG tablet    Sig: Take 1 tablet (325 mg total) by mouth daily with breakfast.    Dispense:  90 tablet    Refill:  1     Follow-up: Return if symptoms worsen or fail to improve.  Theresa Calico, MD

## 2017-04-05 LAB — BASIC METABOLIC PANEL
BUN: 13 mg/dL (ref 6–23)
CHLORIDE: 102 meq/L (ref 96–112)
CO2: 29 meq/L (ref 19–32)
Calcium: 9.5 mg/dL (ref 8.4–10.5)
Creatinine, Ser: 0.76 mg/dL (ref 0.40–1.20)
GFR: 99.79 mL/min (ref >60.00)
Glucose, Bld: 245 mg/dL — ABNORMAL HIGH (ref 70–99)
POTASSIUM: 3.9 meq/L (ref 3.5–5.1)
SODIUM: 139 meq/L (ref 135–145)

## 2017-04-06 ENCOUNTER — Encounter: Payer: Self-pay | Admitting: Internal Medicine

## 2017-04-06 MED ORDER — FERROUS SULFATE 325 (65 FE) MG PO TABS: 325.0000 mg | ORAL_TABLET | Freq: Every day | ORAL | 1 refills | Status: DC

## 2017-04-12 NOTE — Telephone Encounter (Signed)
Can form be resent? They have not received it. Can it be mailed to [facility] [place] [id number]

## 2017-04-16 NOTE — Telephone Encounter (Signed)
I called [facility] to follow up on this. I waited on hold for 29mins. I had to hang up. I am unsure what the issue is. We have faxed this information over multiple times. I have spoken with them before they stated they had everything they needed from [place]. Are they needing a RX? If they call back please transfer the call over or take as much information as you can get.

## 2017-04-17 ENCOUNTER — Other Ambulatory Visit: Payer: Self-pay | Admitting: Internal Medicine

## 2017-04-17 DIAGNOSIS — E1151 Type 2 diabetes mellitus with diabetic peripheral angiopathy without gangrene: Secondary | ICD-10-CM

## 2017-04-17 NOTE — Telephone Encounter (Signed)
Pt received a call today from the office no message. Not sure if its related to this

## 2017-05-08 ENCOUNTER — Encounter (HOSPITAL_COMMUNITY): Payer: PPO

## 2017-05-08 ENCOUNTER — Ambulatory Visit: Payer: PPO | Admitting: Family

## 2017-05-10 ENCOUNTER — Ambulatory Visit (INDEPENDENT_AMBULATORY_CARE_PROVIDER_SITE_OTHER): Payer: PPO | Admitting: Internal Medicine

## 2017-05-10 ENCOUNTER — Encounter: Payer: Self-pay | Admitting: Internal Medicine

## 2017-05-10 VITALS — BP 124/58 | HR 65 | Temp 97.8°F | Resp 16 | Ht 64.5 in | Wt 173.8 lb

## 2017-05-10 DIAGNOSIS — E538 Deficiency of other specified B group vitamins: Secondary | ICD-10-CM

## 2017-05-10 DIAGNOSIS — E1165 Type 2 diabetes mellitus with hyperglycemia: Secondary | ICD-10-CM

## 2017-05-10 DIAGNOSIS — E1151 Type 2 diabetes mellitus with diabetic peripheral angiopathy without gangrene: Secondary | ICD-10-CM | POA: Diagnosis not present

## 2017-05-10 DIAGNOSIS — G63 Polyneuropathy in diseases classified elsewhere: Secondary | ICD-10-CM | POA: Diagnosis not present

## 2017-05-10 DIAGNOSIS — J069 Acute upper respiratory infection, unspecified: Secondary | ICD-10-CM

## 2017-05-10 DIAGNOSIS — Z23 Encounter for immunization: Secondary | ICD-10-CM

## 2017-05-10 DIAGNOSIS — IMO0002 Reserved for concepts with insufficient information to code with codable children: Secondary | ICD-10-CM

## 2017-05-10 LAB — POCT GLYCOSYLATED HEMOGLOBIN (HGB A1C): Hemoglobin A1C: 9.3

## 2017-05-10 MED ORDER — CYANOCOBALAMIN 1000 MCG/ML IJ SOLN
1000.0000 ug | Freq: Once | INTRAMUSCULAR | Status: AC
  Administered 2017-05-10: 1000 ug via INTRAMUSCULAR

## 2017-05-10 MED ORDER — CHLORPHEN-PE-ACETAMINOPHEN 4-10-325 MG PO TABS: 1.0000 | ORAL_TABLET | Freq: Four times a day (QID) | ORAL | 0 refills | Status: DC | PRN

## 2017-05-10 NOTE — Patient Instructions (Signed)
Upper Respiratory Infection, Adult Most upper respiratory infections (URIs) are caused by a virus. A URI affects the nose, throat, and upper air passages. The most common type of URI is often called "the common cold." Follow these instructions at home:  Take medicines only as told by your doctor.  Gargle warm saltwater or take cough drops to comfort your throat as told by your doctor.  Use a warm mist humidifier or inhale steam from a shower to increase air moisture. This may make it easier to breathe.  Drink enough fluid to keep your pee (urine) clear or pale yellow.  Eat soups and other clear broths.  Have a healthy diet.  Rest as needed.  Go back to work when your fever is gone or your doctor says it is okay. ? You may need to stay home longer to avoid giving your URI to others. ? You can also wear a face mask and wash your hands often to prevent spread of the virus.  Use your inhaler more if you have asthma.  Do not use any tobacco products, including cigarettes, chewing tobacco, or electronic cigarettes. If you need help quitting, ask your doctor. Contact a doctor if:  You are getting worse, not better.  Your symptoms are not helped by medicine.  You have chills.  You are getting more short of breath.  You have brown or red mucus.  You have yellow or brown discharge from your nose.  You have pain in your face, especially when you bend forward.  You have a fever.  You have puffy (swollen) neck glands.  You have pain while swallowing.  You have white areas in the back of your throat. Get help right away if:  You have very bad or constant: ? Headache. ? Ear pain. ? Pain in your forehead, behind your eyes, and over your cheekbones (sinus pain). ? Chest pain.  You have long-lasting (chronic) lung disease and any of the following: ? Wheezing. ? Long-lasting cough. ? Coughing up blood. ? A change in your usual mucus.  You have a stiff neck.  You have  changes in your: ? Vision. ? Hearing. ? Thinking. ? Mood. This information is not intended to replace advice given to you by your health care provider. Make sure you discuss any questions you have with your health care provider. Document Released: 10/04/2007 Document Revised: 12/19/2015 Document Reviewed: 07/23/2013 Elsevier Interactive Patient Education  2018 [employer]  

## 2017-05-10 NOTE — Progress Notes (Signed)
Subjective:  Patient ID: Theresa Harrington, female    DOB: [birth year]  Age: 62 y.o. MRN: [id number]  CC: Diabetes and URI   HPI Theresa Harrington presents for a 4-day history of sore throat, nasal congestion, runny nose, and nonproductive cough.  She also wants to recheck her A1c.  Over the last month she is improve her lifestyle modifications and is lost weight.  Outpatient Medications Prior to Visit  Medication Sig Dispense Refill  . Continuous Blood Gluc Receiver (FREESTYLE LIBRE READER) DEVI 1 Device by Does not apply route 3 (three) times daily. 1 Device 1  . Continuous Blood Gluc Sensor (FREESTYLE LIBRE SENSOR SYSTEM) MISC 1 Device by Does not apply route every 30 (thirty) days. 3 each 11  . diltiazem (CARDIZEM CD) 120 MG 24 hr capsule Take 1 capsule (120 mg total) by mouth daily. 30 capsule 11  . ferrous sulfate 325 (65 FE) MG tablet Take 1 tablet (325 mg total) by mouth daily with breakfast. 90 tablet 1  . insulin aspart (NOVOLOG FLEXPEN) 100 UNIT/ML FlexPen Inject 20-24 Units into the skin 3 (three) times daily with meals. 15 mL 11  . Insulin Detemir (LEVEMIR FLEXTOUCH) 100 UNIT/ML Pen Inject 80 Units into the skin daily at 10 pm. DX 250.72 30 mL 3  . Insulin Pen Needle (PEN NEEDLES) 32G X 4 MM MISC Inject 1 pen as directed 4 (four) times daily. Use to inject levemir or novolog as directed. 400 each 3  . losartan-hydrochlorothiazide (HYZAAR) 100-12.5 MG tablet TAKE 1 TABLET BY MOUTH ONCE DAILY 90 tablet 1  . magnesium oxide (MAG-OX) 400 (241.3 Mg) MG tablet TAKE 1 TABLET BY MOUTH ONCE DAILY 90 tablet 1  . metFORMIN (GLUCOPHAGE) 1000 MG tablet TAKE ONE TABLET BY MOUTH TWICE DAILY WITH MEALS 180 tablet 1  . NOVOLOG FLEXPEN 100 UNIT/ML FlexPen INJECT 10 UNITS SUBCUTANEOUSLY THREE TIMES DAILY WITH MEALS 15 mL 11  . pravastatin (PRAVACHOL) 40 MG tablet TAKE ONE TABLET BY MOUTH ONCE DAILY 90 tablet 3  . rivaroxaban (XARELTO) 20 MG TABS tablet Take 1 tablet (20 mg total) by mouth daily with supper.  90 tablet 3  . ezetimibe (ZETIA) 10 MG tablet Take 1 tablet (10 mg total) by mouth daily. 90 tablet 3  . promethazine (PHENERGAN) 12.5 MG tablet Take 1 tablet (12.5 mg total) by mouth every 6 (six) hours as needed for nausea or vomiting. 30 tablet 0   No facility-administered medications prior to visit.     ROS Review of Systems  Constitutional: Negative.  Negative for chills, fatigue and fever.  HENT: Positive for congestion, postnasal drip, rhinorrhea and sore throat. Negative for facial swelling, sinus pressure, trouble swallowing and voice change.   Eyes: Negative for visual disturbance.  Respiratory: Positive for cough. Negative for chest tightness, shortness of breath and wheezing.   Cardiovascular: Negative for chest pain, palpitations and leg swelling.  Gastrointestinal: Negative for abdominal pain, constipation, diarrhea, nausea and vomiting.  Endocrine: Negative.  Negative for polydipsia, polyphagia and polyuria.  Genitourinary: Negative.  Negative for difficulty urinating.  Musculoskeletal: Negative.  Negative for back pain, myalgias and neck pain.  Skin: Negative.  Negative for color change and rash.  Allergic/Immunologic: Negative.   Neurological: Negative.  Negative for dizziness.  Hematological: Negative for adenopathy. Does not bruise/bleed easily.  Psychiatric/Behavioral: Negative.     Objective:  BP (!) 124/58 (BP Location: Left Arm, Patient Position: Sitting, Cuff Size: Normal)   Pulse 65   Temp 97.8 F (36.6 C) (Oral)  Resp 16   Ht 5' 4.5" (1.638 m)   Wt 173 lb 12 oz (78.8 kg)   LMP  (LMP Unknown)   SpO2 98%   BMI 29.36 kg/m   BP Readings from Last 3 Encounters:  05/10/17 (!) 124/58  04/04/17 134/78  03/15/17 120/78    Wt Readings from Last 3 Encounters:  05/10/17 173 lb 12 oz (78.8 kg)  04/04/17 184 lb (83.5 kg)  03/15/17 181 lb 9.6 oz (82.4 kg)    Physical Exam  Constitutional: She is oriented to person, place, and time. No distress.  HENT:    Nose: No mucosal edema or rhinorrhea. No epistaxis. Right sinus exhibits no maxillary sinus tenderness and no frontal sinus tenderness. Left sinus exhibits no maxillary sinus tenderness and no frontal sinus tenderness.  Mouth/Throat: Oropharynx is clear and moist. Mucous membranes are not pale, not dry and not cyanotic. No oral lesions. No trismus in the jaw. No oropharyngeal exudate, posterior oropharyngeal edema, posterior oropharyngeal erythema or tonsillar abscesses.  Eyes: Conjunctivae are normal. Left eye exhibits no discharge. No scleral icterus.  Neck: Normal range of motion. Neck supple. No JVD present. No thyromegaly present.  Cardiovascular: Normal rate, regular rhythm and normal heart sounds. Exam reveals no gallop.  No murmur heard. Pulmonary/Chest: Effort normal and breath sounds normal. No respiratory distress. She has no wheezes. She has no rales.  Abdominal: Soft. Bowel sounds are normal. She exhibits no mass. There is no tenderness. There is no guarding.  Musculoskeletal: Normal range of motion. She exhibits no edema or tenderness.  Neurological: She is alert and oriented to person, place, and time.  Skin: Skin is warm and dry. No rash noted. She is not diaphoretic. No erythema. No pallor.  Vitals reviewed.   Lab Results  Component Value Date   WBC 11.4 (H) 04/04/2017   HGB 11.7 (L) 04/04/2017   HCT 36.9 04/04/2017   PLT 288.0 04/04/2017   GLUCOSE 245 (H) 04/04/2017   CHOL 131 02/01/2017   TRIG 138.0 02/01/2017   HDL 50.20 02/01/2017   LDLDIRECT 160.2 03/01/2010   LDLCALC 54 02/01/2017   ALT 10 06/05/2016   AST 10 06/05/2016   NA 139 04/04/2017   K 3.9 04/04/2017   CL 102 04/04/2017   CREATININE 0.76 04/04/2017   BUN 13 04/04/2017   CO2 29 04/04/2017   TSH 0.602 11/13/2016   INR 1.05 07/16/2014   HGBA1C 9.3 05/10/2017   MICROALBUR 4.4 (H) 04/04/2017    Mm Screening Breast Tomo Bilateral  Result Date: 05/26/2016 CLINICAL DATA:  Screening. History of  bilateral breast lift surgery. EXAM: 2D DIGITAL SCREENING BILATERAL MAMMOGRAM WITH CAD AND ADJUNCT TOMO COMPARISON:  Previous exam(s). ACR Breast Density Category b: There are scattered areas of fibroglandular density. FINDINGS: There are stable postsurgical changes within each breast. There are no findings suspicious for malignancy within either breast. Images were processed with CAD. IMPRESSION: No mammographic evidence of malignancy. A result letter of this screening mammogram will be mailed directly to the patient. RECOMMENDATION: Screening mammogram in one year. (Code:SM-B-01Y) BI-RADS CATEGORY  2: Benign. Electronically Signed   By: Franki Cabot M.D.   On: 05/26/2016 13:04    Assessment & Plan:   [place] was seen today for diabetes and uri.  Diagnoses and all orders for this visit:  Vitamin B12 deficiency neuropathy ([place]) -     cyanocobalamin ((VITAMIN B-12)) injection 1,000 mcg  Uncontrolled type 2 diabetes mellitus with peripheral artery disease ([place])- Her A1c has improved down to  9.3%. -     POCT glycosylated hemoglobin (Hb A1C)  Need for pneumococcal vaccination -     Pneumococcal polysaccharide vaccine 23-valent greater than or equal to 2yo subcutaneous/IM  Viral URI- Will control her symptoms with a combination of chlorpheniramine, pseudoephedrine, and acetaminophen. -     Chlorphen-PE-Acetaminophen (NOREL AD) 4-10-325 MG TABS; Take 1 tablet by mouth every 6 (six) hours as needed.   I have discontinued Keelynn Morais's ezetimibe and promethazine. I am also having her start on Chlorphen-PE-Acetaminophen. Additionally, I am having her maintain her metFORMIN, pravastatin, diltiazem, Insulin Detemir, FREESTYLE LIBRE READER, FREESTYLE LIBRE SENSOR SYSTEM, losartan-hydrochlorothiazide, magnesium oxide, insulin aspart, rivaroxaban, Pen Needles, ferrous sulfate, and NOVOLOG FLEXPEN. We administered cyanocobalamin.  Meds ordered this encounter  Medications  . cyanocobalamin ((VITAMIN B-12))  injection 1,000 mcg  . Chlorphen-PE-Acetaminophen (NOREL AD) 4-10-325 MG TABS    Sig: Take 1 tablet by mouth every 6 (six) hours as needed.    Dispense:  84 tablet    Refill:  0     Follow-up: Return if symptoms worsen or fail to improve.  Scarlette Calico, MD

## 2017-05-16 ENCOUNTER — Other Ambulatory Visit: Payer: Self-pay | Admitting: Internal Medicine

## 2017-05-16 DIAGNOSIS — K219 Gastro-esophageal reflux disease without esophagitis: Secondary | ICD-10-CM

## 2017-06-13 ENCOUNTER — Other Ambulatory Visit: Payer: Self-pay | Admitting: Internal Medicine

## 2017-06-13 DIAGNOSIS — E1151 Type 2 diabetes mellitus with diabetic peripheral angiopathy without gangrene: Secondary | ICD-10-CM

## 2017-06-14 ENCOUNTER — Encounter: Payer: Self-pay | Admitting: Cardiology

## 2017-06-15 ENCOUNTER — Ambulatory Visit: Payer: PPO | Admitting: Internal Medicine

## 2017-06-18 ENCOUNTER — Ambulatory Visit: Payer: PPO | Admitting: Internal Medicine

## 2017-06-19 DIAGNOSIS — Z961 Presence of intraocular lens: Secondary | ICD-10-CM | POA: Diagnosis not present

## 2017-06-19 DIAGNOSIS — H401113 Primary open-angle glaucoma, right eye, severe stage: Secondary | ICD-10-CM | POA: Diagnosis not present

## 2017-06-19 DIAGNOSIS — H401121 Primary open-angle glaucoma, left eye, mild stage: Secondary | ICD-10-CM | POA: Diagnosis not present

## 2017-06-19 LAB — HM DIABETES EYE EXAM

## 2017-06-21 ENCOUNTER — Ambulatory Visit: Payer: PPO | Admitting: Cardiology

## 2017-07-02 ENCOUNTER — Ambulatory Visit: Payer: PPO | Admitting: Internal Medicine

## 2017-07-03 DIAGNOSIS — E1151 Type 2 diabetes mellitus with diabetic peripheral angiopathy without gangrene: Secondary | ICD-10-CM | POA: Diagnosis not present

## 2017-07-03 DIAGNOSIS — Z8631 Personal history of diabetic foot ulcer: Secondary | ICD-10-CM | POA: Diagnosis not present

## 2017-07-03 DIAGNOSIS — Z89422 Acquired absence of other left toe(s): Secondary | ICD-10-CM | POA: Diagnosis not present

## 2017-07-04 ENCOUNTER — Encounter: Payer: Self-pay | Admitting: Internal Medicine

## 2017-07-04 ENCOUNTER — Ambulatory Visit (INDEPENDENT_AMBULATORY_CARE_PROVIDER_SITE_OTHER): Payer: PPO | Admitting: Internal Medicine

## 2017-07-04 VITALS — BP 120/60 | HR 70 | Temp 97.9°F | Resp 16 | Ht 64.5 in | Wt 179.5 lb

## 2017-07-04 DIAGNOSIS — J069 Acute upper respiratory infection, unspecified: Secondary | ICD-10-CM | POA: Diagnosis not present

## 2017-07-04 DIAGNOSIS — G63 Polyneuropathy in diseases classified elsewhere: Secondary | ICD-10-CM

## 2017-07-04 DIAGNOSIS — E538 Deficiency of other specified B group vitamins: Secondary | ICD-10-CM | POA: Diagnosis not present

## 2017-07-04 MED ORDER — CYANOCOBALAMIN 1000 MCG/ML IJ SOLN
1000.0000 ug | Freq: Once | INTRAMUSCULAR | Status: AC
  Administered 2017-07-04: 1000 ug via INTRAMUSCULAR

## 2017-07-04 MED ORDER — HYDROCOD POLST-CPM POLST ER 10-8 MG/5ML PO SUER: 5.0000 mL | Freq: Two times a day (BID) | ORAL | 0 refills | Status: DC | PRN

## 2017-07-04 NOTE — Progress Notes (Signed)
Subjective:  Patient ID: Theresa Harrington, female    DOB: July 28, 1955  Age: 62 y.o. MRN: [id number]  CC: URI   HPI Theresa Harrington presents for a 5-day history of cough productive of clear phlegm.  The cough is rapidly improving but it still interferes with her sleep and activities during the day.  She denies sore throat, hemoptysis, fever, chills, or chest pain.  Outpatient Medications Prior to Visit  Medication Sig Dispense Refill  . Chlorphen-PE-Acetaminophen (NOREL AD) 4-10-325 MG TABS Take 1 tablet by mouth every 6 (six) hours as needed. 84 tablet 0  . Continuous Blood Gluc Receiver (FREESTYLE LIBRE READER) DEVI 1 Device by Does not apply route 3 (three) times daily. 1 Device 1  . Continuous Blood Gluc Sensor (FREESTYLE LIBRE SENSOR SYSTEM) MISC 1 Device by Does not apply route every 30 (thirty) days. 3 each 11  . DEXILANT 60 MG capsule TAKE ONE CAPSULE BY MOUTH ONCE DAILY 90 capsule 1  . diltiazem (CARDIZEM CD) 120 MG 24 hr capsule Take 1 capsule (120 mg total) by mouth daily. 30 capsule 11  . ferrous sulfate 325 (65 FE) MG tablet Take 1 tablet (325 mg total) by mouth daily with breakfast. 90 tablet 1  . insulin aspart (NOVOLOG FLEXPEN) 100 UNIT/ML FlexPen Inject 20-24 Units into the skin 3 (three) times daily with meals. 15 mL 11  . Insulin Detemir (LEVEMIR FLEXTOUCH) 100 UNIT/ML Pen Inject 80 Units into the skin daily at 10 pm. DX 250.72 30 mL 3  . Insulin Pen Needle (PEN NEEDLES) 32G X 4 MM MISC Inject 1 pen as directed 4 (four) times daily. Use to inject levemir or novolog as directed. 400 each 3  . losartan-hydrochlorothiazide (HYZAAR) 100-12.5 MG tablet TAKE 1 TABLET BY MOUTH ONCE DAILY 90 tablet 1  . magnesium oxide (MAG-OX) 400 (241.3 Mg) MG tablet TAKE 1 TABLET BY MOUTH ONCE DAILY 90 tablet 1  . metFORMIN (GLUCOPHAGE) 1000 MG tablet TAKE 1 TABLET BY MOUTH TWICE DAILY WITH MEALS 180 tablet 1  . NOVOLOG FLEXPEN 100 UNIT/ML FlexPen INJECT 10 UNITS SUBCUTANEOUSLY THREE TIMES DAILY WITH MEALS  15 mL 11  . pravastatin (PRAVACHOL) 40 MG tablet TAKE ONE TABLET BY MOUTH ONCE DAILY 90 tablet 3  . rivaroxaban (XARELTO) 20 MG TABS tablet Take 1 tablet (20 mg total) by mouth daily with supper. 90 tablet 3  . latanoprost (XALATAN) 0.005 % ophthalmic solution      No facility-administered medications prior to visit.     ROS Review of Systems  Constitutional: Negative for chills, fatigue and fever.  HENT: Negative.  Negative for facial swelling, sinus pressure, sore throat and trouble swallowing.   Eyes: Negative.   Respiratory: Positive for cough. Negative for chest tightness, shortness of breath and wheezing.   Cardiovascular: Negative for chest pain, palpitations and leg swelling.  Gastrointestinal: Negative for abdominal pain, constipation, diarrhea, nausea and vomiting.  Endocrine: Negative.   Genitourinary: Negative.  Negative for difficulty urinating.  Musculoskeletal: Negative.  Negative for arthralgias and myalgias.  Skin: Negative for color change and pallor.  Allergic/Immunologic: Negative.   Neurological: Negative.  Negative for dizziness, weakness and light-headedness.  Hematological: Negative for adenopathy. Does not bruise/bleed easily.  Psychiatric/Behavioral: Negative.     Objective:  BP 120/60 (BP Location: Left Arm, Patient Position: Sitting, Cuff Size: Normal)   Pulse 70   Temp 97.9 F (36.6 C) (Oral)   Resp 16   Ht 5' 4.5" (1.638 m)   Wt 179 lb 8 oz (81.4  kg)   LMP  (LMP Unknown)   SpO2 99%   BMI 30.34 kg/m   BP Readings from Last 3 Encounters:  07/04/17 120/60  05/10/17 (!) 124/58  04/04/17 134/78    Wt Readings from Last 3 Encounters:  07/04/17 179 lb 8 oz (81.4 kg)  05/10/17 173 lb 12 oz (78.8 kg)  04/04/17 184 lb (83.5 kg)    Physical Exam  Constitutional: She is oriented to person, place, and time. No distress.  HENT:  Mouth/Throat: Oropharynx is clear and moist. No oropharyngeal exudate.  Eyes: Conjunctivae are normal. Left eye  exhibits no discharge. No scleral icterus.  Neck: Neck supple. No JVD present. No thyromegaly present.  Cardiovascular: Normal rate, regular rhythm and normal heart sounds. Exam reveals no gallop.  No murmur heard. Pulmonary/Chest: Effort normal and breath sounds normal. No respiratory distress. She has no wheezes. She has no rales.  Abdominal: Soft. Bowel sounds are normal. She exhibits no distension and no mass. There is no tenderness. There is no guarding.  Musculoskeletal: Normal range of motion. She exhibits no edema, tenderness or deformity.  Lymphadenopathy:    She has no cervical adenopathy.  Neurological: She is alert and oriented to person, place, and time.  Skin: Skin is warm and dry. No rash noted. She is not diaphoretic. No erythema. No pallor.  Vitals reviewed.   Lab Results  Component Value Date   WBC 11.4 (H) 04/04/2017   HGB 11.7 (L) 04/04/2017   HCT 36.9 04/04/2017   PLT 288.0 04/04/2017   GLUCOSE 245 (H) 04/04/2017   CHOL 131 02/01/2017   TRIG 138.0 02/01/2017   HDL 50.20 02/01/2017   LDLDIRECT 160.2 03/01/2010   LDLCALC 54 02/01/2017   ALT 10 06/05/2016   AST 10 06/05/2016   NA 139 04/04/2017   K 3.9 04/04/2017   CL 102 04/04/2017   CREATININE 0.76 04/04/2017   BUN 13 04/04/2017   CO2 29 04/04/2017   TSH 0.602 11/13/2016   INR 1.05 07/16/2014   HGBA1C 9.3 05/10/2017   MICROALBUR 4.4 (H) 04/04/2017    Mm Screening Breast Tomo Bilateral  Result Date: 05/26/2016 CLINICAL DATA:  Screening. History of bilateral breast lift surgery. EXAM: 2D DIGITAL SCREENING BILATERAL MAMMOGRAM WITH CAD AND ADJUNCT TOMO COMPARISON:  Previous exam(s). ACR Breast Density Category b: There are scattered areas of fibroglandular density. FINDINGS: There are stable postsurgical changes within each breast. There are no findings suspicious for malignancy within either breast. Images were processed with CAD. IMPRESSION: No mammographic evidence of malignancy. A result letter of this  screening mammogram will be mailed directly to the patient. RECOMMENDATION: Screening mammogram in one year. (Code:SM-B-01Y) BI-RADS CATEGORY  2: Benign. Electronically Signed   By: Franki Cabot M.D.   On: 05/26/2016 13:04    Assessment & Plan:   Shilynn was seen today for uri.  Diagnoses and all orders for this visit:  Viral URI- her symptoms are rapidly improving.  Antibiotic therapy is not indicated.  Will control her symptoms with Tussionex suspension as needed. -     chlorpheniramine-HYDROcodone (TUSSIONEX PENNKINETIC ER) 10-8 MG/5ML SUER; Take 5 mLs by mouth every 12 (twelve) hours as needed for cough.  Vitamin B12 deficiency neuropathy (HCC) -     cyanocobalamin ((VITAMIN B-12)) injection 1,000 mcg   I am having Oaklee Goulette start on chlorpheniramine-HYDROcodone. I am also having her maintain her pravastatin, diltiazem, Insulin Detemir, FREESTYLE LIBRE READER, FREESTYLE LIBRE SENSOR SYSTEM, losartan-hydrochlorothiazide, insulin aspart, rivaroxaban, Pen Needles, ferrous sulfate, NOVOLOG FLEXPEN,  Chlorphen-PE-Acetaminophen, DEXILANT, magnesium oxide, metFORMIN, and latanoprost. We administered cyanocobalamin.  Meds ordered this encounter  Medications  . chlorpheniramine-HYDROcodone (TUSSIONEX PENNKINETIC ER) 10-8 MG/5ML SUER    Sig: Take 5 mLs by mouth every 12 (twelve) hours as needed for cough.    Dispense:  140 mL    Refill:  0  . cyanocobalamin ((VITAMIN B-12)) injection 1,000 mcg     Follow-up: Return if symptoms worsen or fail to improve.  Scarlette Calico, MD

## 2017-07-04 NOTE — Patient Instructions (Signed)
Upper Respiratory Infection, Adult Most upper respiratory infections (URIs) are caused by a virus. A URI affects the nose, throat, and upper air passages. The most common type of URI is often called "the common cold." Follow these instructions at home:  Take medicines only as told by your doctor.  Gargle warm saltwater or take cough drops to comfort your throat as told by your doctor.  Use a warm mist humidifier or inhale steam from a shower to increase air moisture. This may make it easier to breathe.  Drink enough fluid to keep your pee (urine) clear or pale yellow.  Eat soups and other clear broths.  Have a healthy diet.  Rest as needed.  Go back to work when your fever is gone or your doctor says it is okay. ? You may need to stay home longer to avoid giving your URI to others. ? You can also wear a face mask and wash your hands often to prevent spread of the virus.  Use your inhaler more if you have asthma.  Do not use any tobacco products, including cigarettes, chewing tobacco, or electronic cigarettes. If you need help quitting, ask your doctor. Contact a doctor if:  You are getting worse, not better.  Your symptoms are not helped by medicine.  You have chills.  You are getting more short of breath.  You have brown or red mucus.  You have yellow or brown discharge from your nose.  You have pain in your face, especially when you bend forward.  You have a fever.  You have puffy (swollen) neck glands.  You have pain while swallowing.  You have white areas in the back of your throat. Get help right away if:  You have very bad or constant: ? Headache. ? Ear pain. ? Pain in your forehead, behind your eyes, and over your cheekbones (sinus pain). ? Chest pain.  You have long-lasting (chronic) lung disease and any of the following: ? Wheezing. ? Long-lasting cough. ? Coughing up blood. ? A change in your usual mucus.  You have a stiff neck.  You have  changes in your: ? Vision. ? Hearing. ? Thinking. ? Mood. This information is not intended to replace advice given to you by your health care provider. Make sure you discuss any questions you have with your health care provider. Document Released: 10/04/2007 Document Revised: 12/19/2015 Document Reviewed: 07/23/2013 Elsevier Interactive Patient Education  2018 [employer]  

## 2017-07-09 ENCOUNTER — Other Ambulatory Visit: Payer: Self-pay | Admitting: Internal Medicine

## 2017-07-09 DIAGNOSIS — Z1231 Encounter for screening mammogram for malignant neoplasm of breast: Secondary | ICD-10-CM

## 2017-07-25 ENCOUNTER — Ambulatory Visit: Payer: PPO

## 2017-07-25 ENCOUNTER — Ambulatory Visit: Payer: PPO | Admitting: Internal Medicine

## 2017-07-25 ENCOUNTER — Encounter: Payer: Self-pay | Admitting: Internal Medicine

## 2017-07-25 VITALS — BP 116/74 | HR 60 | Ht 64.5 in | Wt 182.8 lb

## 2017-07-25 DIAGNOSIS — E1165 Type 2 diabetes mellitus with hyperglycemia: Secondary | ICD-10-CM | POA: Diagnosis not present

## 2017-07-25 DIAGNOSIS — E785 Hyperlipidemia, unspecified: Secondary | ICD-10-CM

## 2017-07-25 DIAGNOSIS — IMO0002 Reserved for concepts with insufficient information to code with codable children: Secondary | ICD-10-CM

## 2017-07-25 DIAGNOSIS — E1151 Type 2 diabetes mellitus with diabetic peripheral angiopathy without gangrene: Secondary | ICD-10-CM

## 2017-07-25 MED ORDER — DULAGLUTIDE 0.75 MG/0.5ML ~~LOC~~ SOAJ: SUBCUTANEOUS | 1 refills | Status: DC

## 2017-07-25 NOTE — Patient Instructions (Addendum)
Please continue: - Metformin 1000 mg 2x a day with meals - Levemir 80 units at bedtime - Novolog before meals: 20-24 units depending on the size of the meal  Please start Trulicity 0.22 mg weekly. Let me know when you are close to running out to call in the higher dose to your pharmacy (1.5 mg).  Please come back for a follow-up appointment in 3 months.

## 2017-07-25 NOTE — Progress Notes (Signed)
Follow Patient ID: Theresa Harrington, female   DOB: 1956-03-20, 62 y.o.   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 Last visit 4 months ago.  Last hemoglobin A1c was higher: Lab Results  Component Value Date   HGBA1C 9.3 05/10/2017   HGBA1C 9.9 (H) 04/04/2017   HGBA1C 9.9 11/08/2016   Pt was on a regimen of: - Metformin 1000 mg 2x a day, with meals - Amaryl 2 mg before b'fast - Levemir 110 units at bedtime  - Novolog 0-10 units 3x a day units 3x a day, before meals She stopped the Bydureon >> did not help.  She is now on: - Metformin 1000 mg 2x a day with meals - Levemir 80 units at bedtime (pen) - Novolog (vial) 15 minutes before meals: 20-24 units depending on the size of the meal, but skips it before lunch when she eats out  Pt checks her sugars 2-5 times a day: - am:58, 73-164, 179 >> 108-184, 225, 265 - 2h after b'fast: n/c >> 104-198 >> n/c - before lunch: n/c >> 118-142 >> n/c - 2h after lunch: n/c >> 153, 213 >> n/c - before dinner: n/c >> 110-181, 192 >> 175 - 2h after dinner: n/c >> 146, 233 >> n/c - bedtime: 213-300 >> 84-199, 233, 299 >> n/c - nighttime:  n/c >> 193 >>  189, 220 >> 286 Lowest sugar was 58 >> 108; she has hypoglycemia awareness in the 60s.  Highest sugar was 299 >> 286.  Glucometer: Precision Extra  Pt's meals are: - Breakfast: 1 boiled egg + 1 cheese toast + coffee - Lunch: salad or sausage croissant, etc. - Dinner: differs >> hamburger and salad, 1/2 sodas - Snacks: none She reduced sodas and juice.  -No CKD, last BUN/creatinine:  Lab Results  Component Value Date   BUN 13 04/04/2017   BUN 16 12/18/2016   CREATININE 0.76 04/04/2017   CREATININE 0.74 12/18/2016  On losartan. -+ HL; last set of lipids: Lab Results  Component Value Date   CHOL 131  02/01/2017   HDL 50.20 02/01/2017   LDLCALC 54 02/01/2017   LDLDIRECT 160.2 03/01/2010   TRIG 138.0 02/01/2017   CHOLHDL 3 02/01/2017  On  pravastatin, Zetia - last eye exam: 08/2016: No DR - no numbness and tingling in her feet.  Of note, she has a history of lap band sx 2007, and following breast and thigh lift and abdominoplasty in 2009. She also has HTN. Dx of paroxysmal atrial fibrillation since 10/2016. She was started on Xarelto and Cardizem.  ROS: Constitutional: no weight gain/no weight loss, no fatigue, no subjective hyperthermia, no subjective hypothermia Eyes: no blurry vision, no xerophthalmia ENT: no sore throat, no nodules palpated in throat, no dysphagia, no odynophagia, no hoarseness Cardiovascular: no CP/no SOB/no palpitations/no leg swelling Respiratory: no cough/no SOB/no wheezing Gastrointestinal: no N/no V/no D/no C/no acid reflux Musculoskeletal: no muscle aches/no joint aches Skin: no rashes, no hair loss Neurological: no tremors/no numbness/no tingling/no dizziness  I reviewed pt's medications, allergies, PMH, social hx, family hx, and changes were documented in the history of present illness. Otherwise, unchanged from my initial visit note.   Past Medical History:  Diagnosis Date  . Anemia   . Anxiety   . Asymptomatic cholelithiasis   . Atherosclerosis of aorta ([place])   . GERD (  gastroesophageal reflux disease)   . Hypertension   . Hypothyroidism    no meds  . PAD (peripheral artery disease) ([place])   . Peripheral arterial occlusive disease (HCC)    lower extremities  . Pneumonia   . PONV (postoperative nausea and vomiting)   . Right ureteral stone   . Type 2 diabetes mellitus ([place])   . Wears glasses    Past Surgical History:  Procedure Laterality Date  . AMPUTATION Left 07/16/2014   Procedure: LEFT SECOND TOE AMPUTATION;  Surgeon: Serafina Mitchell, MD;  Location: [place];  Service: Vascular;  Laterality: Left;  With Nerve block  . AUGMENTATION  MAMMAPLASTY    . BELPHAROPTOSIS REPAIR     eyelid lift  . COMBINED AUGMENTATION MAMMAPLASTY AND ABDOMINOPLASTY  2009   W/  BILATERAL  THIGH LIFT  . CYSTO/  RIGHT URETERAL STENT PLACEMENT  12-30-2010  . CYSTOSCOPY WITH RETROGRADE PYELOGRAM, URETEROSCOPY AND STENT PLACEMENT Right 10/07/2013   Procedure: CYSTOSCOPY WITH RETROGRADE PYELOGRAM, right URETEROSCOPY AND STENT PLACEMENT, stone extraction;  Surgeon: Arvil Persons, MD;  Location: [facility];  Service: Urology;  Laterality: Right;  . CYSTOSCOPY WITH STENT PLACEMENT Right 06/04/2013   Procedure: CYSTOSCOPY WITH STENT PLACEMENT;  Surgeon: Franchot Gallo, MD;  Location: [facility];  Service: Urology;  Laterality: Right;  . DILATATION & CURETTAGE/HYSTEROSCOPY WITH MYOSURE N/A 04/27/2015   Procedure: DILATATION & CURETTAGE/HYSTEROSCOPY WITH MYOSURE;  Surgeon: Terrance Mass, MD;  Location: [facility];  Service: Gynecology;  Laterality: N/A;  . ENDARTERECTOMY FEMORAL Left 06/08/2014   Procedure: Left Leg Common Femoral and External Iliac  Endartarectomy with patch Angioplasty;  Surgeon: Rosetta Posner, MD;  Location: [place];  Service: Vascular;  Laterality: Left;  . EXTRACORPOREAL SHOCK WAVE LITHOTRIPSY Right 08-04-2013//   06-23-2013//   01-16-2011  . FEMORAL-POPLITEAL BYPASS GRAFT Left 06/08/2014   Procedure: Left Leg Femoral -Popliteal Bypass Graft;  Surgeon: Rosetta Posner, MD;  Location: [place];  Service: Vascular;  Laterality: Left;  . FEMORAL-POPLITEAL BYPASS GRAFT Left 06/09/2014   Procedure: Left Femoral and Popliteal Exposure; Left Femoral to Anterior Tibial Bypass Graft using Propaten 81mm by 80cm Goretex Graft; Left Tibial Endarterectomy; Left Femoraland Popliteal Thrombectomy ;  Surgeon: Serafina Mitchell, MD;  Location: [place];  Service: Vascular;  Laterality: Left;  . HOLMIUM LASER APPLICATION Right 05/06/1094   Procedure: HOLMIUM LASER APPLICATION;  Surgeon: Arvil Persons, MD;  Location: [facility];  Service: Urology;   Laterality: Right;  . KIDNEY STONE SURGERY  April 2015   1-2 stones  . LAPAROSCOPIC GASTRIC BANDING  05-29-2005  . LITHOTRIPSY  2-3 times  . LOWER EXTREMITY ANGIOGRAM N/A 06/04/2014   Procedure: LOWER EXTREMITY ANGIOGRAM;  Surgeon: Serafina Mitchell, MD;  Location: [facility];  Service: Cardiovascular;  Laterality: N/A;  . ORIF FIFTH METACARPAL [place]  RIGHT HAND  04-21-2002  . REVISION AND RE-SITING LAP-BAND PORT  04-08-2010   W/  UPPER EGD  . RIGHT KNEE PATELLECTOMY W/ REPAIR OF EXTENSOR MECHANISM  04-14-2002  . Toenail removed Left Jan. 21, 2016   2nd toenail-  Dr. Barkley Bruns   Social History   Socioeconomic History  . Marital status: Single    Spouse name: Not on file  . Number of children: Not on file  . Years of education: Not on file  . Highest education level: Not on file  Occupational History  . Occupation: Quarry manager: [employer]  . Financial  resource strain: Not on file  . Food insecurity:    Worry: Not on file    Inability: Not on file  . Transportation needs:    Medical: Not on file    Non-medical: Not on file  Tobacco Use  . Smoking status: Former Smoker    Packs/day: 1.00    Years: 28.00    Pack years: 28.00    Types: Cigarettes    Last attempt to quit: 09/05/2001    Years since quitting: 15.8  . Smokeless tobacco: Never Used  Substance and Sexual Activity  . Alcohol use: No    Alcohol/week: 0.0 oz  . Drug use: No  . Sexual activity: Yes  Lifestyle  . Physical activity:    Days per week: Not on file    Minutes per session: Not on file  . Stress: Not on file  Relationships  . Social connections:    Talks on phone: Not on file    Gets together: Not on file    Attends religious service: Not on file    Active member of club or organization: Not on file    Attends meetings of clubs or organizations: Not on file    Relationship status: Not on file  . Intimate partner violence:    Fear of current or ex partner: Not on file     Emotionally abused: Not on file    Physically abused: Not on file    Forced sexual activity: Not on file  Other Topics Concern  . Not on file  Social History Narrative   Regular exercise- yes   Current Outpatient Medications on File Prior to Visit  Medication Sig Dispense Refill  . Chlorphen-PE-Acetaminophen (NOREL AD) 4-10-325 MG TABS Take 1 tablet by mouth every 6 (six) hours as needed. 84 tablet 0  . chlorpheniramine-HYDROcodone (TUSSIONEX PENNKINETIC ER) 10-8 MG/5ML SUER Take 5 mLs by mouth every 12 (twelve) hours as needed for cough. 140 mL 0  . Continuous Blood Gluc Receiver (FREESTYLE LIBRE READER) DEVI 1 Device by Does not apply route 3 (three) times daily. 1 Device 1  . Continuous Blood Gluc Sensor (FREESTYLE LIBRE SENSOR SYSTEM) MISC 1 Device by Does not apply route every 30 (thirty) days. 3 each 11  . DEXILANT 60 MG capsule TAKE ONE CAPSULE BY MOUTH ONCE DAILY 90 capsule 1  . diltiazem (CARDIZEM CD) 120 MG 24 hr capsule Take 1 capsule (120 mg total) by mouth daily. 30 capsule 11  . ferrous sulfate 325 (65 FE) MG tablet Take 1 tablet (325 mg total) by mouth daily with breakfast. 90 tablet 1  . insulin aspart (NOVOLOG FLEXPEN) 100 UNIT/ML FlexPen Inject 20-24 Units into the skin 3 (three) times daily with meals. 15 mL 11  . Insulin Detemir (LEVEMIR FLEXTOUCH) 100 UNIT/ML Pen Inject 80 Units into the skin daily at 10 pm. DX 250.72 30 mL 3  . Insulin Pen Needle (PEN NEEDLES) 32G X 4 MM MISC Inject 1 pen as directed 4 (four) times daily. Use to inject levemir or novolog as directed. 400 each 3  . latanoprost (XALATAN) 0.005 % ophthalmic solution     . losartan-hydrochlorothiazide (HYZAAR) 100-12.5 MG tablet TAKE 1 TABLET BY MOUTH ONCE DAILY 90 tablet 1  . magnesium oxide (MAG-OX) 400 (241.3 Mg) MG tablet TAKE 1 TABLET BY MOUTH ONCE DAILY 90 tablet 1  . metFORMIN (GLUCOPHAGE) 1000 MG tablet TAKE 1 TABLET BY MOUTH TWICE DAILY WITH MEALS 180 tablet 1  . NOVOLOG FLEXPEN 100 UNIT/ML FlexPen  INJECT 10 UNITS SUBCUTANEOUSLY THREE TIMES DAILY WITH MEALS 15 mL 11  . pravastatin (PRAVACHOL) 40 MG tablet TAKE ONE TABLET BY MOUTH ONCE DAILY 90 tablet 3  . rivaroxaban (XARELTO) 20 MG TABS tablet Take 1 tablet (20 mg total) by mouth daily with supper. 90 tablet 3   No current facility-administered medications on file prior to visit.    Allergies  Allergen Reactions  . Amlodipine Swelling  . Atorvastatin Other (See Comments)    Reaction:  Muscle aches   . Ampicillin Nausea And Vomiting and Other (See Comments)      . Codeine Nausea And Vomiting    Ask patient  . Lisinopril Cough       . Penicillins Nausea And Vomiting and Other (See Comments)      . Januvia [Sitagliptin] Nausea And Vomiting   Family History  Problem Relation Age of Onset  . Diabetes Father   . Heart disease Father   . Deep vein thrombosis Father   . Hyperlipidemia Father   . Diabetes Sister   . Heart disease Sister   . Deep vein thrombosis Sister   . Hyperlipidemia Sister   . Diabetes Brother   . Heart disease Brother   . Cancer Brother   . Hyperlipidemia Brother   . Colon cancer Maternal Aunt   . Diabetes Brother   . Diabetes Brother   . Kidney disease Mother   . Hyperlipidemia Mother   . Other Mother        AAA   and    Amputation  . Arthritis Other   . Cancer Other        colon  . Hypertension Other   . Stroke Other    Pt has FH of DM in F,S, 3B.  PE: BP 116/74   Pulse 60   Ht 5' 4.5" (1.638 m)   Wt 182 lb 12.8 oz (82.9 kg)   LMP  (LMP Unknown)   SpO2 98%   BMI 30.89 kg/m  Wt Readings from Last 3 Encounters:  07/25/17 182 lb 12.8 oz (82.9 kg)  07/04/17 179 lb 8 oz (81.4 kg)  05/10/17 173 lb 12 oz (78.8 kg)   Constitutional: overweight, in NAD Eyes: PERRLA, EOMI, no exophthalmos ENT: moist mucous membranes, no thyromegaly, no cervical lymphadenopathy Cardiovascular: RRR, No MRG Respiratory: CTA B Gastrointestinal: abdomen soft, NT, ND, BS+ Musculoskeletal: no deformities,  strength intact in all 4 Skin: moist, warm, no rashes Neurological: no tremor with outstretched hands, DTR normal in all 4  ASSESSMENT: 1. DM2, insulin-dependent, uncontrolled, with complications - PAD  - s/p R 2nd toe amputation 2016   She tried the freestyle libre CGM but she did not feel this gave her accurate readings.  2. HL  PLAN:  1. Patient with long-standing, uncontrolled, diabetes, on metformin, and basal-bolus insulin regimen.  HbA1c improved at last visit 9.1%, which is still high, however, last HbA1c from 2 months ago was slightly higher at 9.3%.  At last visit, we added  10 units of NovoLog before lunch, but did not make any other changes, since her sugars were much better controlled.  We also discussed at last visit to note bolus the full mealtime dose for postprandial correction. - at this visit, she almost exclusively checks am sugars >> very variable, in the 200s when forgets evening insulin... - will try to all Trulicity - discussed benefits and poss. SEs - will start at a low dose and increase as tolerated - I suggested to:  Patient Instructions  Please continue: - Metformin 1000 mg 2x a day with meals - Levemir 80 units at bedtime - Novolog before meals: 20-24 units depending on the size of the meal  Please start Trulicity 8.08 mg weekly. Let me know when you are close to running out to call in the higher dose to your pharmacy (1.5 mg).  Please come back for a follow-up appointment in 3 months.  - continue checking sugars at different times of the day - check 3x a day, rotating checks - advised for yearly eye exams >> she is UTD - Return to clinic in 3 mo with sugar log    2. HL  -Reviewed her most recent lipid panel: Excellent improvement in her LDL on pravastatin and Zetia -No side effects from the above medications  Philemon Kingdom, MD PhD [facility] End[facility]

## 2017-07-31 ENCOUNTER — Ambulatory Visit: Payer: PPO | Admitting: Cardiology

## 2017-07-31 ENCOUNTER — Encounter: Payer: Self-pay | Admitting: Cardiology

## 2017-07-31 VITALS — BP 126/58 | HR 80 | Ht 64.5 in | Wt 179.0 lb

## 2017-07-31 DIAGNOSIS — I10 Essential (primary) hypertension: Secondary | ICD-10-CM | POA: Diagnosis not present

## 2017-07-31 DIAGNOSIS — E119 Type 2 diabetes mellitus without complications: Secondary | ICD-10-CM

## 2017-07-31 DIAGNOSIS — I480 Paroxysmal atrial fibrillation: Secondary | ICD-10-CM

## 2017-07-31 DIAGNOSIS — I739 Peripheral vascular disease, unspecified: Secondary | ICD-10-CM

## 2017-07-31 NOTE — Patient Instructions (Signed)
Medication Instructions:  The current medical regimen is effective;  continue present plan and medications.  Follow-Up: Follow up in 6 months with Lori Gerhardt, NP.  You will receive a letter in the mail 2 months before you are due.  Please call us when you receive this letter to schedule your follow up appointment.  Follow up in 1 year with Dr. Skains.  You will receive a letter in the mail 2 months before you are due.  Please call us when you receive this letter to schedule your follow up appointment.  If you need a refill on your cardiac medications before your next appointment, please call your pharmacy.  Thank you for choosing [facility]!!     

## 2017-07-31 NOTE — Progress Notes (Signed)
Cardiology Office Note:    Date:  07/31/2017   ID:  Theresa Harrington, DOB 1955-12-12, MRN [id number]  PCP:  [name], MD  Cardiologist:  [name], MD  Referring MD: [name], MD     History of Present Illness:    Theresa Harrington is a 62 y.o. female here for follow up of atrial fibrillation at the request of Dr. [name]. She was started on Xarelto to reduce risk of stroke. Clopidogrel was stopped to avoid excessive bleeding complications. She has a history of diabetes, hypertension.   She has had a femoral-popliteal bypass on 06/08/14 by Dr. Curt Jews and Trula Slade. This must be the reason why she had been on Plavix.   Her EKG from 11/08/16 shows atrial fibrillation with heart rate of 134 bpm. No symptoms, no SOB, no CP, syncope. Bent over and felt a little light headed. No heart issue. No stroke   DM, PVD, HTN, F, no CVA  Today, she discussed her stroke family history which is quite extensive. She is worried about this. She wonders if this was inevitable.  She had a few friends recently that had a stroke.  07/31/17-overall she is doing quite well, no bleeding, taking her Xarelto without any difficulty.  No recent palpitations.  Tolerating the diltiazem well.  She has been worried because many of her friends have recently had strokes.  Denies any chest pain fevers chills nausea vomiting syncope bleeding    Past Medical History:  Diagnosis Date  . Anemia   . Anxiety   . Asymptomatic cholelithiasis   . Atherosclerosis of aorta ([place])   . GERD (gastroesophageal reflux disease)   . Hypertension   . Hypothyroidism    no meds  . PAD (peripheral artery disease) ([place])   . Peripheral arterial occlusive disease (HCC)    lower extremities  . Pneumonia   . PONV (postoperative nausea and vomiting)   . Right ureteral stone   . Type 2 diabetes mellitus ([place])   . Wears glasses     Past Surgical History:  Procedure Laterality Date  . AMPUTATION Left 07/16/2014   Procedure: LEFT SECOND  TOE AMPUTATION;  Surgeon: Serafina Mitchell, MD;  Location: [facility];  Service: Vascular;  Laterality: Left;  With Nerve block  . AUGMENTATION MAMMAPLASTY    . BELPHAROPTOSIS REPAIR     eyelid lift  . COMBINED AUGMENTATION MAMMAPLASTY AND ABDOMINOPLASTY  2009   W/  BILATERAL  THIGH LIFT  . CYSTO/  RIGHT URETERAL STENT PLACEMENT  12-30-2010  . CYSTOSCOPY WITH RETROGRADE PYELOGRAM, URETEROSCOPY AND STENT PLACEMENT Right 10/07/2013   Procedure: CYSTOSCOPY WITH RETROGRADE PYELOGRAM, right URETEROSCOPY AND STENT PLACEMENT, stone extraction;  Surgeon: Arvil Persons, MD;  Location: [facility];  Service: Urology;  Laterality: Right;  . CYSTOSCOPY WITH STENT PLACEMENT Right 06/04/2013   Procedure: CYSTOSCOPY WITH STENT PLACEMENT;  Surgeon: Franchot Gallo, MD;  Location: [facility];  Service: Urology;  Laterality: Right;  . DILATATION & CURETTAGE/HYSTEROSCOPY WITH MYOSURE N/A 04/27/2015   Procedure: DILATATION & CURETTAGE/HYSTEROSCOPY WITH MYOSURE;  Surgeon: Terrance Mass, MD;  Location: [facility];  Service: Gynecology;  Laterality: N/A;  . ENDARTERECTOMY FEMORAL Left 06/08/2014   Procedure: Left Leg Common Femoral and External Iliac  Endartarectomy with patch Angioplasty;  Surgeon: Rosetta Posner, MD;  Location: [facility];  Service: Vascular;  Laterality: Left;  . EXTRACORPOREAL SHOCK WAVE LITHOTRIPSY Right 08-04-2013//   06-23-2013//   01-16-2011  . FEMORAL-POPLITEAL BYPASS GRAFT Left 06/08/2014  Procedure: Left Leg Femoral -Popliteal Bypass Graft;  Surgeon: Rosetta Posner, MD;  Location: [facility];  Service: Vascular;  Laterality: Left;  . FEMORAL-POPLITEAL BYPASS GRAFT Left 06/09/2014   Procedure: Left Femoral and Popliteal Exposure; Left Femoral to Anterior Tibial Bypass Graft using Propaten 63m by 80cm Goretex Graft; Left Tibial Endarterectomy; Left Femoraland Popliteal Thrombectomy ;  Surgeon: VSerafina Mitchell MD;  [place] [facility]  Service: Vascular;  Laterality: Left;  . HOLMIUM LASER APPLICATION Right  67/08/1698  Procedure: HOLMIUM LASER APPLICATION;  Surgeon: MArvil Persons MD;  [facility]  Service: Urology;  Laterality: Right;  . KIDNEY STONE SURGERY  April 2015   1-2 stones  . LAPAROSCOPIC GASTRIC BANDING  05-29-2005  . LITHOTRIPSY  2-3 times  . LOWER EXTREMITY ANGIOGRAM N/A 06/04/2014   Procedure: LOWER EXTREMITY ANGIOGRAM;  Surgeon: VSerafina Mitchell MD;  [facility];  Service: Cardiovascular;  Laterality: N/A;  . ORIF FIFTH [place]ARPAL [place] RIGHT HAND  04-21-2002  . REVISION AND RE-SITING LAP-BAND PORT  04-08-2010   W/  UPPER EGD  . RIGHT KNEE PATELLECTOMY W/ REPAIR OF EXTENSOR MECHANISM  04-14-2002  . Toenail removed Left Jan. 21, 2016   2nd toenail-  Dr. PBarkley Bruns   Current Medications: Current Meds  Medication Sig  . Chlorphen-PE-Acetaminophen (NOREL AD) 4-10-325 MG TABS Take 1 tablet by mouth every 6 (six) hours as needed.  . chlorpheniramine-HYDROcodone (TUSSIONEX PENNKINETIC ER) 10-8 MG/5ML SUER Take 5 mLs by mouth every 12 (twelve) hours as needed for cough.  . Continuous Blood Gluc Receiver (FREESTYLE LIBRE READER) DEVI 1 Device by Does not apply route 3 (three) times daily.  . Continuous Blood Gluc Sensor (FREESTYLE LIBRE SENSOR SYSTEM) MISC 1 Device by Does not apply route every 30 (thirty) days.  .[name] KitchenDEXILANT 60 MG capsule TAKE ONE CAPSULE BY MOUTH ONCE DAILY  . diltiazem (CARDIZEM CD) 120 MG 24 hr capsule Take 1 capsule (120 mg total) by mouth daily.  . Dulaglutide (TRULICITY) 01.74MBS/4.9QPSOPN Please start 05.91mg Trulicity under skin weekly  . ferrous sulfate 325 (65 FE) MG tablet Take 1 tablet (325 mg total) by mouth daily with breakfast.  . insulin aspart (NOVOLOG FLEXPEN) 100 UNIT/ML FlexPen Inject 20-24 Units into the skin 3 (three) times daily with meals.  . Insulin Detemir (LEVEMIR FLEXTOUCH) 100 UNIT/ML Pen Inject 80 Units into the skin daily at 10 pm. DX 250.72  . Insulin Pen Needle (PEN NEEDLES) 32G X 4 MM MISC Inject 1 pen as  directed 4 (four) times daily. Use to inject levemir or novolog as directed.  . latanoprost (XALATAN) 0.005 % ophthalmic solution   . losartan-hydrochlorothiazide (HYZAAR) 100-12.5 MG tablet TAKE 1 TABLET BY MOUTH ONCE DAILY  . magnesium oxide (MAG-OX) 400 (241.3 Mg) MG tablet TAKE 1 TABLET BY MOUTH ONCE DAILY  . metFORMIN (GLUCOPHAGE) 1000 MG tablet TAKE 1 TABLET BY MOUTH TWICE DAILY WITH MEALS  . NOVOLOG FLEXPEN 100 UNIT/ML FlexPen INJECT 10 UNITS SUBCUTANEOUSLY THREE TIMES DAILY WITH MEALS  . pravastatin (PRAVACHOL) 40 MG tablet TAKE ONE TABLET BY MOUTH ONCE DAILY  . rivaroxaban (XARELTO) 20 MG TABS tablet Take 1 tablet (20 mg total) by mouth daily with supper.     Allergies:   Amlodipine; Atorvastatin; Ampicillin; Codeine; Lisinopril; Penicillins; and Januvia [sitagliptin]   Social History   Socioeconomic History  . Marital status: Single    Spouse name: Not on file  . Number of children: Not on file  .  Years of education: Not on file  . Highest education level: Not on file  Occupational History  . Occupation: Quarry manager: [employer]  . Financial resource strain: Not on file  . Food insecurity:    Worry: Not on file    Inability: Not on file  . Transportation needs:    Medical: Not on file    Non-medical: Not on file  Tobacco Use  . Smoking status: Former Smoker    Packs/day: 1.00    Years: 28.00    Pack years: 28.00    Types: Cigarettes    Last attempt to quit: 09/05/2001    Years since quitting: 15.9  . Smokeless tobacco: Never Used  Substance and Sexual Activity  . Alcohol use: No    Alcohol/week: 0.0 oz  . Drug use: No  . Sexual activity: Yes  Lifestyle  . Physical activity:    Days per week: Not on file    Minutes per session: Not on file  . Stress: Not on file  Relationships  . Social connections:    Talks on phone: Not on file    Gets together: Not on file    Attends religious service: Not on file    Active member of club or  organization: Not on file    Attends meetings of clubs or organizations: Not on file    Relationship status: Not on file  Other Topics Concern  . Not on file  Social History Narrative   Regular exercise- yes     Family History: The patient's family history includes Arthritis in her other; Cancer in her brother and other; Colon cancer in her maternal aunt; Deep vein thrombosis in her father and sister; Diabetes in her brother, brother, brother, father, and sister; Heart disease in her brother, father, and sister; Hyperlipidemia in her brother, father, mother, and sister; Hypertension in her other; Kidney disease in her mother; Other in her mother; Stroke in her other. ROS:   Please see the history of present illness.     All other systems reviewed and are negative.  EKGs/Labs/Other Studies Reviewed:    The following studies were reviewed today: Lab work, prior office note, EKGs reviewed.  ECHO 11/22/16: - Left ventricle: The cavity size was normal. Wall thickness was   normal. Systolic function was normal. The estimated ejection   fraction was in the range of 55% to 60%. Wall motion was normal;   there were no regional wall motion abnormalities. The study is   not technically sufficient to allow evaluation of LV diastolic   function. - Mitral valve: Moderately calcified annulus. Mildly thickened   leaflets . Mild stenosis. There was trivial regurgitation. Valve   area by pressure half-time: 1.79 cm^2. - Left atrium: Severely dilated. - Right atrium: The atrium was mildly dilated. - Inferior vena cava: The vessel was normal in size. The   respirophasic diameter changes were in the normal range (>= 50%),   consistent with normal central venous pressure.  Impressions:  - LVEF 55-60%, normal wall thickness, normal wall motion, moderate   MAC with mild stenosis and trivial MR, severe LAE, mild RAE,   normal IVC.  EKG:  EKG is  ordered today.  The ekg ordered today demonstrates  11/13/16  Recent Labs: 11/13/2016: TSH 0.602 04/04/2017: BUN 13; Creatinine, Ser 0.76; Hemoglobin 11.7; Platelets 288.0; Potassium 3.9; Sodium 139  Recent Lipid Panel    Component Value Date/Time   CHOL 131 02/01/2017  1134   TRIG 138.0 02/01/2017 1134   HDL 50.20 02/01/2017 1134   CHOLHDL 3 02/01/2017 1134   VLDL 27.6 02/01/2017 1134   LDLCALC 54 02/01/2017 1134   LDLDIRECT 160.2 03/01/2010 1001    Physical Exam:    VS:  BP (!) 126/58   Pulse 80   Ht 5' 4.5" (1.638 m)   Wt 179 lb (81.2 kg)   LMP  (LMP Unknown)   SpO2 96%   BMI 30.25 kg/m     Wt Readings from Last 3 Encounters:  07/31/17 179 lb (81.2 kg)  07/25/17 182 lb 12.8 oz (82.9 kg)  07/04/17 179 lb 8 oz (81.4 kg)     GEN: Well nourished, well developed, in no acute distress  HEENT: normal  Neck: no JVD, carotid bruits, or masses Cardiac: RRR; no murmurs, rubs, or gallops,no edema  Respiratory:  clear to auscultation bilaterally, normal work of breathing GI: soft, nontender, nondistended, + BS MS: no deformity or atrophy  Skin: warm and dry, no rash Neuro:  Alert and Oriented x 3, Strength and sensation are intact Psych: euthymic mood, full affect   ASSESSMENT:    1. Paroxysmal atrial fibrillation (HCC)   2. Diabetes mellitus with coincident hypertension ([place])   3. Peripheral vascular disease ([place])    PLAN:    In order of problems listed above:  Paroxysmal atrial fibrillation   - Continue with Xarelto.   - Off of Plavix  - I would also stop her aspirin since she will be fully anticoagulated.  - She did feel some sweats, perhaps fevers which broke, I wonder she was having a viral syndrome that may have triggered her atrial fibrillation. Nonetheless, it was asymptomatic and she has many strokes in her family and I fear that she may return to atrial fibrillation and if she is not protected could have a stroke herself.  -Continue her low-dose diltiazem CD 120 mg once a day.  Chronic anti-coag   - Xarelto,  doing well, no bleeding.  At one point she did have some urologic bleeding but this has resolved.  This patients CHA2DS2-VASc Score and unadjusted Ischemic Stroke Rate (% per year) is equal to 4.8 % stroke rate/year from a score of 4  Above score calculated as 1 point each if present [CHF, HTN, DM, Vascular=MI/PAD/Aortic Plaque, Age if 65-74, or Female] Above score calculated as 2 points each if present [Age > 75, or Stroke/TIA/TE]  Diabetes with hypertension  - Per Dr. [name], doing very well.  Blood pressure under good control.  Hemoglobin A1c elevated at 9.3.  PVD  - Post bypass  - Dr. Trula Slade, stable.  No claudication.  We will have her come back in one month to see APP  Medication Adjustments/Labs and Tests Ordered: Current medicines are reviewed at length with the patient today.  Concerns regarding medicines are outlined above.  No orders of the defined types were placed in this encounter.  No orders of the defined types were placed in this encounter.   Signed, [name], MD  07/31/2017 4:09 PM     [facility]

## 2017-08-02 ENCOUNTER — Ambulatory Visit (INDEPENDENT_AMBULATORY_CARE_PROVIDER_SITE_OTHER)
Admission: RE | Admit: 2017-08-02 | Discharge: 2017-08-02 | Disposition: A | Discharge status: Home / Self Care | Payer: PPO | Source: Ambulatory Visit | Attending: Internal Medicine | Admitting: Internal Medicine

## 2017-08-02 ENCOUNTER — Encounter: Payer: Self-pay | Admitting: Internal Medicine

## 2017-08-02 ENCOUNTER — Ambulatory Visit (INDEPENDENT_AMBULATORY_CARE_PROVIDER_SITE_OTHER): Payer: PPO | Admitting: Internal Medicine

## 2017-08-02 VITALS — BP 132/62 | HR 62 | Temp 98.8°F | Resp 16 | Ht 64.0 in | Wt 178.0 lb

## 2017-08-02 DIAGNOSIS — J988 Other specified respiratory disorders: Secondary | ICD-10-CM

## 2017-08-02 DIAGNOSIS — R059 Cough, unspecified: Secondary | ICD-10-CM

## 2017-08-02 DIAGNOSIS — R05 Cough: Secondary | ICD-10-CM

## 2017-08-02 MED ORDER — CEFDINIR 300 MG PO CAPS: 300.0000 mg | ORAL_CAPSULE | Freq: Two times a day (BID) | ORAL | 0 refills | Status: AC

## 2017-08-02 NOTE — Patient Instructions (Signed)
Cough, Adult  Coughing is a reflex that clears your throat and your airways. Coughing helps to heal and protect your lungs. It is normal to cough occasionally, but a cough that happens with other symptoms or lasts a long time may be a sign of a condition that needs treatment. A cough may last only 2-3 weeks (acute), or it may last longer than 8 weeks (chronic).  What are the causes?  Coughing is commonly caused by:   Breathing in substances that irritate your lungs.   A viral or bacterial respiratory infection.   Allergies.   Asthma.   Postnasal drip.   Smoking.   Acid backing up from the stomach into the esophagus (gastroesophageal reflux).   Certain medicines.   Chronic lung problems, including COPD (or rarely, lung cancer).   Other medical conditions such as heart failure.    Follow these instructions at home:  Pay attention to any changes in your symptoms. Take these actions to help with your discomfort:   Take medicines only as told by your health care provider.  ? If you were prescribed an antibiotic medicine, take it as told by your health care provider. Do not stop taking the antibiotic even if you start to feel better.  ? Talk with your health care provider before you take a cough suppressant medicine.   Drink enough fluid to keep your urine clear or pale yellow.   If the air is dry, use a cold steam vaporizer or humidifier in your bedroom or your home to help loosen secretions.   Avoid anything that causes you to cough at work or at home.   If your cough is worse at night, try sleeping in a semi-upright position.   Avoid cigarette smoke. If you smoke, quit smoking. If you need help quitting, ask your health care provider.   Avoid caffeine.   Avoid alcohol.   Rest as needed.    Contact a health care provider if:   You have new symptoms.   You cough up pus.   Your cough does not get better after 2-3 weeks, or your cough gets worse.   You cannot control your cough with suppressant  medicines and you are losing sleep.   You develop pain that is getting worse or pain that is not controlled with pain medicines.   You have a fever.   You have unexplained weight loss.   You have night sweats.  Get help right away if:   You cough up blood.   You have difficulty breathing.   Your heartbeat is very fast.  This information is not intended to replace advice given to you by your health care provider. Make sure you discuss any questions you have with your health care provider.  Document Released: 10/14/2010 Document Revised: 09/23/2015 Document Reviewed: 06/24/2014  Elsevier Interactive Patient Education  2018 Elsevier Inc[employer]

## 2017-08-03 NOTE — Progress Notes (Signed)
Subjective:  Patient ID: Theresa Harrington, female    DOB: 1955-06-06  Age: 62 y.o. MRN: [id number]  CC: Cough   HPI [name] presents for a 5-day history of cough that is productive of thick yellow/green phlegm with chills and low-grade fever to 99.2.  Outpatient Medications Prior to Visit  Medication Sig Dispense Refill  . Chlorphen-PE-Acetaminophen (NOREL AD) 4-10-325 MG TABS Take 1 tablet by mouth every 6 (six) hours as needed. 84 tablet 0  . chlorpheniramine-HYDROcodone (TUSSIONEX PENNKINETIC ER) 10-8 MG/5ML SUER Take 5 mLs by mouth every 12 (twelve) hours as needed for cough. 140 mL 0  . Continuous Blood Gluc Receiver (FREESTYLE LIBRE READER) DEVI 1 Device by Does not apply route 3 (three) times daily. 1 Device 1  . Continuous Blood Gluc Sensor (FREESTYLE LIBRE SENSOR SYSTEM) MISC 1 Device by Does not apply route every 30 (thirty) days. 3 each 11  . DEXILANT 60 MG capsule TAKE ONE CAPSULE BY MOUTH ONCE DAILY 90 capsule 1  . diltiazem (CARDIZEM CD) 120 MG 24 hr capsule Take 1 capsule (120 mg total) by mouth daily. 30 capsule 11  . Dulaglutide (TRULICITY) 8.14 GY/1.8HU SOPN Please start 3.14 mg Trulicity under skin weekly 4 pen 1  . ferrous sulfate 325 (65 FE) MG tablet Take 1 tablet (325 mg total) by mouth daily with breakfast. 90 tablet 1  . insulin aspart (NOVOLOG FLEXPEN) 100 UNIT/ML FlexPen Inject 20-24 Units into the skin 3 (three) times daily with meals. 15 mL 11  . Insulin Detemir (LEVEMIR FLEXTOUCH) 100 UNIT/ML Pen Inject 80 Units into the skin daily at 10 pm. DX 250.72 30 mL 3  . Insulin Pen Needle (PEN NEEDLES) 32G X 4 MM MISC Inject 1 pen as directed 4 (four) times daily. Use to inject levemir or novolog as directed. 400 each 3  . latanoprost (XALATAN) 0.005 % ophthalmic solution     . losartan-hydrochlorothiazide (HYZAAR) 100-12.5 MG tablet TAKE 1 TABLET BY MOUTH ONCE DAILY 90 tablet 1  . magnesium oxide (MAG-OX) 400 (241.3 Mg) MG tablet TAKE 1 TABLET BY MOUTH ONCE DAILY 90  tablet 1  . metFORMIN (GLUCOPHAGE) 1000 MG tablet TAKE 1 TABLET BY MOUTH TWICE DAILY WITH MEALS 180 tablet 1  . NOVOLOG FLEXPEN 100 UNIT/ML FlexPen INJECT 10 UNITS SUBCUTANEOUSLY THREE TIMES DAILY WITH MEALS 15 mL 11  . pravastatin (PRAVACHOL) 40 MG tablet TAKE ONE TABLET BY MOUTH ONCE DAILY 90 tablet 3  . rivaroxaban (XARELTO) 20 MG TABS tablet Take 1 tablet (20 mg total) by mouth daily with supper. 90 tablet 3   No facility-administered medications prior to visit.     ROS Review of Systems  Constitutional: Positive for chills and fever. Negative for diaphoresis and fatigue.  HENT: Negative.   Eyes: Negative.   Respiratory: Positive for cough. Negative for chest tightness, shortness of breath and wheezing.   Cardiovascular: Negative.  Negative for chest pain, palpitations and leg swelling.  Gastrointestinal: Negative for abdominal pain, constipation, diarrhea, nausea and vomiting.  Endocrine: Negative.   Genitourinary: Negative.  Negative for difficulty urinating.  Musculoskeletal: Negative.  Negative for myalgias.  Skin: Negative.  Negative for color change, pallor and rash.  Allergic/Immunologic: Negative.   Neurological: Negative.  Negative for dizziness, weakness and light-headedness.  Hematological: Negative for adenopathy. Does not bruise/bleed easily.  Psychiatric/Behavioral: Negative.     Objective:  BP 132/62 (BP Location: Left Arm, Patient Position: Sitting, Cuff Size: Normal)   Pulse 62   Temp 98.8 F (37.1 C) (Oral)  Resp 16   Ht 5\' 4"  (1.626 m)   Wt 178 lb (80.7 kg)   LMP  (LMP Unknown)   SpO2 98%   BMI 30.55 kg/m   BP Readings from Last 3 Encounters:  08/02/17 132/62  07/31/17 (!) 126/58  07/25/17 116/74    Wt Readings from Last 3 Encounters:  08/02/17 178 lb (80.7 kg)  07/31/17 179 lb (81.2 kg)  07/25/17 182 lb 12.8 oz (82.9 kg)    Physical Exam  Constitutional: She is oriented to person, place, and time. No distress.  HENT:  Mouth/Throat:  Oropharynx is clear and moist. No oropharyngeal exudate.  Eyes: Conjunctivae are normal. Left eye exhibits no discharge. No scleral icterus.  Neck: Normal range of motion. Neck supple. No JVD present. No thyromegaly present.  Cardiovascular: Normal rate, regular rhythm and normal heart sounds. Exam reveals no gallop and no friction rub.  No murmur heard. Pulmonary/Chest: Effort normal and breath sounds normal. No respiratory distress. She has no wheezes. She has no rales.  Abdominal: Soft. Bowel sounds are normal. She exhibits no distension and no mass. There is no tenderness. There is no guarding.  Musculoskeletal: Normal range of motion. She exhibits no edema or tenderness.  Lymphadenopathy:    She has no cervical adenopathy.  Neurological: She is alert and oriented to person, place, and time.  Skin: Skin is warm and dry. No rash noted. She is not diaphoretic. No erythema. No pallor.  Vitals reviewed.   Lab Results  Component Value Date   WBC 11.4 (H) 04/04/2017   HGB 11.7 (L) 04/04/2017   HCT 36.9 04/04/2017   PLT 288.0 04/04/2017   GLUCOSE 245 (H) 04/04/2017   CHOL 131 02/01/2017   TRIG 138.0 02/01/2017   HDL 50.20 02/01/2017   LDLDIRECT 160.2 03/01/2010   LDLCALC 54 02/01/2017   ALT 10 06/05/2016   AST 10 06/05/2016   NA 139 04/04/2017   K 3.9 04/04/2017   CL 102 04/04/2017   CREATININE 0.76 04/04/2017   BUN 13 04/04/2017   CO2 29 04/04/2017   TSH 0.602 11/13/2016   INR 1.05 07/16/2014   HGBA1C 9.3 05/10/2017   MICROALBUR 4.4 (H) 04/04/2017    Dg Chest 2 View  Result Date: 08/03/2017 CLINICAL DATA:  Cough and congestion EXAM: CHEST - 2 VIEW COMPARISON:  11/13/2013 FINDINGS: The heart size and mediastinal contours are within normal limits. Both lungs are clear. The visualized skeletal structures are unremarkable. Trachea is midline. Atherosclerosis noted of the aorta. No acute osseous finding or compression fracture. Previous gastric banding noted. IMPRESSION: No active  cardiopulmonary disease. Electronically Signed   By: Jerilynn Mages.  Shick M.D.   On: 08/03/2017 08:26    Assessment & Plan:   [name] was seen today for cough.  Diagnoses and all orders for this visit:  Cough- Her chest x-ray is negative for mass or infiltrate. -     DG Chest 2 View; Future  RTI (respiratory tract infection)- I will treat the infection with Omnicef. -     cefdinir (OMNICEF) 300 MG capsule; Take 1 capsule (300 mg total) by mouth 2 (two) times daily for 10 days.   I am having Ophie Silvey start on cefdinir. I am also having her maintain her pravastatin, diltiazem, Insulin Detemir, FREESTYLE LIBRE READER, FREESTYLE LIBRE SENSOR SYSTEM, losartan-hydrochlorothiazide, insulin aspart, rivaroxaban, Pen Needles, ferrous sulfate, NOVOLOG FLEXPEN, Chlorphen-PE-Acetaminophen, DEXILANT, magnesium oxide, metFORMIN, latanoprost, chlorpheniramine-HYDROcodone, and Dulaglutide.  Meds ordered this encounter  Medications  . cefdinir (OMNICEF) 300 MG capsule  Sig: Take 1 capsule (300 mg total) by mouth 2 (two) times daily for 10 days.    Dispense:  20 capsule    Refill:  0     Follow-up: Return in about 3 weeks (around 08/23/2017).  Scarlette Calico, MD

## 2017-08-06 ENCOUNTER — Telehealth: Payer: Self-pay

## 2017-08-06 NOTE — Telephone Encounter (Signed)
The Harrington returned her Theresa Harrington assistance application. I have completed the providers part of the application and placed it in Dr Marlou Porch mail bin awaiting his signature.

## 2017-08-06 NOTE — Telephone Encounter (Signed)
Dr Marlou Porch has signed the pt assistance application and I have faxed it to [facility] and [place].

## 2017-08-15 ENCOUNTER — Telehealth: Payer: Self-pay | Admitting: Internal Medicine

## 2017-08-15 MED ORDER — FLUCONAZOLE 150 MG PO TABS: 150.0000 mg | ORAL_TABLET | Freq: Once | ORAL | 0 refills | Status: AC

## 2017-08-15 NOTE — Telephone Encounter (Signed)
Okay to send in [facility] for her;

## 2017-08-15 NOTE — Telephone Encounter (Signed)
Copied from [place] [phone]. Topic: Quick Communication - See Telephone Encounter >> Aug 15, 2017 11:10 AM Cleaster Corin, NT wrote: CRM for notification. See Telephone encounter for: 08/15/17. Pt. Calling to see if she can have something called in for a yeast infection. Pt. Has recently took antibiotics and now having symptoms of an yeast infection   [place] [address] [facility], [place] [address] Phone: [phone] Fax: [phone]

## 2017-08-15 NOTE — Telephone Encounter (Signed)
Called and informed pt of same.

## 2017-08-16 ENCOUNTER — Ambulatory Visit
Admission: RE | Admit: 2017-08-16 | Discharge: 2017-08-16 | Disposition: A | Discharge status: Home / Self Care | Payer: PPO | Source: Ambulatory Visit | Attending: Internal Medicine | Admitting: Internal Medicine

## 2017-08-16 DIAGNOSIS — Z1231 Encounter for screening mammogram for malignant neoplasm of breast: Secondary | ICD-10-CM | POA: Diagnosis not present

## 2017-08-16 LAB — HM MAMMOGRAPHY

## 2017-08-23 NOTE — Telephone Encounter (Signed)
(  Routing message to Dr. [name])   Please advise.  Thanks

## 2017-08-23 NOTE — Telephone Encounter (Signed)
Left patient vm to call back to schedule wet prep with [facility].

## 2017-08-23 NOTE — Telephone Encounter (Signed)
She needs to be seen for a wet prep

## 2017-08-23 NOTE — Telephone Encounter (Signed)
Hi Tammy,  Please call patient and get her set up to come in office to be seen so we can get her set up for a wet prep and send it down to lab to be cultured.  Thanks

## 2017-08-23 NOTE — Telephone Encounter (Signed)
Patient states diflucan is not working, can something stronger be called in, [place] in [place]

## 2017-08-24 NOTE — Telephone Encounter (Addendum)
Relation to pt: self  Call back number:[phone]  Reason for call:  Patient returned call regarding message below and as per flo coordinator schedule patient with Jodi Mourning for today. Patient and I were disconnected, attempted to reach out but was unsuccessful, LVM,

## 2017-09-04 DIAGNOSIS — S98132A Complete traumatic amputation of one left lesser toe, initial encounter: Secondary | ICD-10-CM | POA: Diagnosis not present

## 2017-09-04 DIAGNOSIS — T148XXA Other injury of unspecified body region, initial encounter: Secondary | ICD-10-CM | POA: Diagnosis not present

## 2017-09-04 DIAGNOSIS — E1151 Type 2 diabetes mellitus with diabetic peripheral angiopathy without gangrene: Secondary | ICD-10-CM | POA: Diagnosis not present

## 2017-09-13 DIAGNOSIS — Z872 Personal history of diseases of the skin and subcutaneous tissue: Secondary | ICD-10-CM | POA: Diagnosis not present

## 2017-09-13 DIAGNOSIS — E1151 Type 2 diabetes mellitus with diabetic peripheral angiopathy without gangrene: Secondary | ICD-10-CM | POA: Diagnosis not present

## 2017-09-18 ENCOUNTER — Encounter: Payer: Self-pay | Admitting: Family

## 2017-09-18 ENCOUNTER — Ambulatory Visit (HOSPITAL_COMMUNITY)
Admission: RE | Admit: 2017-09-18 | Discharge: 2017-09-18 | Disposition: A | Discharge status: Home / Self Care | Payer: PPO | Source: Ambulatory Visit | Attending: Surgery | Admitting: Surgery

## 2017-09-18 ENCOUNTER — Ambulatory Visit (INDEPENDENT_AMBULATORY_CARE_PROVIDER_SITE_OTHER): Payer: PPO | Admitting: Family

## 2017-09-18 VITALS — BP 146/77 | HR 59 | Temp 97.8°F | Resp 16 | Ht 66.0 in | Wt 179.9 lb

## 2017-09-18 DIAGNOSIS — E1151 Type 2 diabetes mellitus with diabetic peripheral angiopathy without gangrene: Secondary | ICD-10-CM | POA: Diagnosis not present

## 2017-09-18 DIAGNOSIS — I70213 Atherosclerosis of native arteries of extremities with intermittent claudication, bilateral legs: Secondary | ICD-10-CM

## 2017-09-18 DIAGNOSIS — Z87891 Personal history of nicotine dependence: Secondary | ICD-10-CM

## 2017-09-18 DIAGNOSIS — S98132A Complete traumatic amputation of one left lesser toe, initial encounter: Secondary | ICD-10-CM

## 2017-09-18 DIAGNOSIS — Z95828 Presence of other vascular implants and grafts: Secondary | ICD-10-CM | POA: Diagnosis not present

## 2017-09-18 DIAGNOSIS — IMO0002 Reserved for concepts with insufficient information to code with codable children: Secondary | ICD-10-CM

## 2017-09-18 DIAGNOSIS — R0989 Other specified symptoms and signs involving the circulatory and respiratory systems: Secondary | ICD-10-CM

## 2017-09-18 DIAGNOSIS — I779 Disorder of arteries and arterioles, unspecified: Secondary | ICD-10-CM

## 2017-09-18 NOTE — Patient Instructions (Signed)

## 2017-09-18 NOTE — Progress Notes (Signed)
[facility] [place]   CC: Follow up peripheral artery occlusive disease  History of Present Illness Theresa Harrington is a 62 y.o. female who initially underwent left femoral-popliteal bypass graft by Dr. Donnetta Hutching on 02/06/2015. Unfortunately this occluded early. Dr. Trula Slade redid her operation with Gore-Tex, 6 mm propaten on 06/09/2014 .She was ultimately discharged from the hospital on IV antibiotics. She then began hyperbaric oxygen treatment. She ultimately developed necrotic changes to the left second toe. 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 Doppler studies in December 2016 revealed bypass graft occlusion. Her ABI was 0.68.  Dr. Trula Slade last evaluated pt on 05-08-16. At that time the patient's symptoms remained stable.  She is able to tolerate her daily activity level.  She did not have any open wounds. Dr. Trula Slade discussed with the patient that we will evaluate her on a yearly basis with ABIs.  As long as her symptoms remain stable, no intervention is recommended.  He also discussed the importance of taking excellent care of her feet.  She will contact us sooner if she develops problems.  She denies any open wounds.   After walking about 1.5 blocks, both calves have pain, relieved by sitting.   She denies any known history of stroke or TIA.    Diabetic: Yes, A1C was 9.3 on 05-10-17, uncontrolled  Tobacco use: former smoker, quit in 2003, smoked x 28 years  Pt meds include: Statin :Yes Betablocker: No ASA: No Other anticoagulants/antiplatelets: Xarelto, has atrial fib  Past Medical History:  Diagnosis Date  . Anemia   . Anxiety   . Asymptomatic cholelithiasis   . Atherosclerosis of aorta ([place])   . GERD (gastroesophageal reflux disease)   . Hypertension   . Hypothyroidism    no meds  . PAD (peripheral artery disease) ([place])   . Peripheral arterial occlusive disease (HCC)    lower extremities  . Pneumonia   . PONV (postoperative nausea and  vomiting)   . Right ureteral stone   . Type 2 diabetes mellitus ([place])   . Wears glasses     Social History Social History   Tobacco Use  . Smoking status: Former Smoker    Packs/day: 1.00    Years: 28.00    Pack years: 28.00    Types: Cigarettes    Last attempt to quit: 09/05/2001    Years since quitting: 16.0  . Smokeless tobacco: Never Used  Substance Use Topics  . Alcohol use: No    Alcohol/week: 0.0 oz  . Drug use: No    Family History Family History  Problem Relation Age of Onset  . Diabetes Father   . Heart disease Father   . Deep vein thrombosis Father   . Hyperlipidemia Father   . Diabetes Sister   . Heart disease Sister   . Deep vein thrombosis Sister   . Hyperlipidemia Sister   . Diabetes Brother   . Heart disease Brother   . Cancer Brother   . Hyperlipidemia Brother   . Colon cancer Maternal Aunt   . Diabetes Brother   . Diabetes Brother   . Kidney disease Mother   . Hyperlipidemia Mother   . Other Mother        AAA   and    Amputation  . Arthritis Other   . Cancer Other        colon  . Hypertension Other   . Stroke Other     Past Surgical History:  Procedure Laterality Date  .  AMPUTATION Left 07/16/2014   Procedure: LEFT SECOND TOE AMPUTATION;  Surgeon: Serafina Mitchell, MD;  Location: [place];  Service: Vascular;  Laterality: Left;  With Nerve block  . AUGMENTATION MAMMAPLASTY    . BELPHAROPTOSIS REPAIR     eyelid lift  . COMBINED AUGMENTATION MAMMAPLASTY AND ABDOMINOPLASTY  2009   W/  BILATERAL  THIGH LIFT  . CYSTO/  RIGHT URETERAL STENT PLACEMENT  12-30-2010  . CYSTOSCOPY WITH RETROGRADE PYELOGRAM, URETEROSCOPY AND STENT PLACEMENT Right 10/07/2013   Procedure: CYSTOSCOPY WITH RETROGRADE PYELOGRAM, right URETEROSCOPY AND STENT PLACEMENT, stone extraction;  Surgeon: Arvil Persons, MD;  Location: [facility];  Service: Urology;  Laterality: Right;  . CYSTOSCOPY WITH STENT PLACEMENT Right 06/04/2013   Procedure: CYSTOSCOPY WITH STENT  PLACEMENT;  Surgeon: Franchot Gallo, MD;  Location: [facility];  Service: Urology;  Laterality: Right;  . DILATATION & CURETTAGE/HYSTEROSCOPY WITH MYOSURE N/A 04/27/2015   Procedure: DILATATION & CURETTAGE/HYSTEROSCOPY WITH MYOSURE;  Surgeon: Terrance Mass, MD;  Location: [facility];  Service: Gynecology;  Laterality: N/A;  . ENDARTERECTOMY FEMORAL Left 06/08/2014   Procedure: Left Leg Common Femoral and External Iliac  Endartarectomy with patch Angioplasty;  Surgeon: Rosetta Posner, MD;  Location: [place];  Service: Vascular;  Laterality: Left;  . EXTRACORPOREAL SHOCK WAVE LITHOTRIPSY Right 08-04-2013//   06-23-2013//   01-16-2011  . FEMORAL-POPLITEAL BYPASS GRAFT Left 06/08/2014   Procedure: Left Leg Femoral -Popliteal Bypass Graft;  Surgeon: Rosetta Posner, MD;  Location: [place];  Service: Vascular;  Laterality: Left;  . FEMORAL-POPLITEAL BYPASS GRAFT Left 06/09/2014   Procedure: Left Femoral and Popliteal Exposure; Left Femoral to Anterior Tibial Bypass Graft using Propaten 48mm by 80cm Goretex Graft; Left Tibial Endarterectomy; Left Femoraland Popliteal Thrombectomy ;  Surgeon: Serafina Mitchell, MD;  Location: [place];  Service: Vascular;  Laterality: Left;  . HOLMIUM LASER APPLICATION Right 09/04/4330   Procedure: HOLMIUM LASER APPLICATION;  Surgeon: Arvil Persons, MD;  Location[facility]  Service: Urology;  Laterality: Right;  . KIDNEY STONE SURGERY  April 2015   1-2 stones  . LAPAROSCOPIC GASTRIC BANDING  05-29-2005  . LITHOTRIPSY  2-3 times  . LOWER EXTREMITY ANGIOGRAM N/A 06/04/2014   Procedure: LOWER EXTREMITY ANGIOGRAM;  Surgeon: Serafina Mitchell, MD;  Location: [facility];  Service: Cardiovascular;  Laterality: N/A;  . ORIF FIFTH METACARPAL [place]  RIGHT HAND  04-21-2002  . REVISION AND RE-SITING LAP-BAND PORT  04-08-2010   W/  UPPER EGD  . RIGHT KNEE PATELLECTOMY W/ REPAIR OF EXTENSOR MECHANISM  04-14-2002  . Toenail removed Left Jan. 21, 2016   2nd toenail-  Dr. Barkley Bruns    Allergies   Allergen Reactions  . Amlodipine Swelling  . Atorvastatin Other (See Comments)    Reaction:  Muscle aches   . Ampicillin Nausea And Vomiting and Other (See Comments)  . Codeine Nausea And Vomiting    Ask patient  . Lisinopril Cough       . Penicillins Nausea And Vomiting and Other (See Comments)  . Januvia [Sitagliptin] Nausea And Vomiting    Current Outpatient Medications  Medication Sig Dispense Refill  . Chlorphen-PE-Acetaminophen (NOREL AD) 4-10-325 MG TABS Take 1 tablet by mouth every 6 (six) hours as needed. 84 tablet 0  . chlorpheniramine-HYDROcodone (TUSSIONEX PENNKINETIC ER) 10-8 MG/5ML SUER Take 5 mLs by mouth every 12 (twelve) hours as needed for cough. 140 mL 0  . Continuous Blood Gluc Receiver (FREESTYLE LIBRE READER) DEVI 1 Device by  Does not apply route 3 (three) times daily. 1 Device 1  . Continuous Blood Gluc Sensor (FREESTYLE LIBRE SENSOR SYSTEM) MISC 1 Device by Does not apply route every 30 (thirty) days. 3 each 11  . DEXILANT 60 MG capsule TAKE ONE CAPSULE BY MOUTH ONCE DAILY 90 capsule 1  . diltiazem (CARDIZEM CD) 120 MG 24 hr capsule Take 1 capsule (120 mg total) by mouth daily. 30 capsule 11  . Dulaglutide (TRULICITY) 5.10 CH/8.5ID SOPN Please start 7.82 mg Trulicity under skin weekly 4 pen 1  . ferrous sulfate 325 (65 FE) MG tablet Take 1 tablet (325 mg total) by mouth daily with breakfast. 90 tablet 1  . insulin aspart (NOVOLOG FLEXPEN) 100 UNIT/ML FlexPen Inject 20-24 Units into the skin 3 (three) times daily with meals. 15 mL 11  . Insulin Detemir (LEVEMIR FLEXTOUCH) 100 UNIT/ML Pen Inject 80 Units into the skin daily at 10 pm. DX 250.72 30 mL 3  . Insulin Pen Needle (PEN NEEDLES) 32G X 4 MM MISC Inject 1 pen as directed 4 (four) times daily. Use to inject levemir or novolog as directed. 400 each 3  . latanoprost (XALATAN) 0.005 % ophthalmic solution     . losartan-hydrochlorothiazide (HYZAAR) 100-12.5 MG tablet TAKE 1 TABLET BY MOUTH ONCE DAILY 90 tablet 1   . magnesium oxide (MAG-OX) 400 (241.3 Mg) MG tablet TAKE 1 TABLET BY MOUTH ONCE DAILY 90 tablet 1  . metFORMIN (GLUCOPHAGE) 1000 MG tablet TAKE 1 TABLET BY MOUTH TWICE DAILY WITH MEALS 180 tablet 1  . NOVOLOG FLEXPEN 100 UNIT/ML FlexPen INJECT 10 UNITS SUBCUTANEOUSLY THREE TIMES DAILY WITH MEALS 15 mL 11  . pravastatin (PRAVACHOL) 40 MG tablet TAKE ONE TABLET BY MOUTH ONCE DAILY 90 tablet 3  . rivaroxaban (XARELTO) 20 MG TABS tablet Take 1 tablet (20 mg total) by mouth daily with supper. 90 tablet 3   No current facility-administered medications for this visit.     ROS: See HPI for pertinent positives and negatives.   Physical Examination  Vitals:   09/18/17 1156 09/18/17 1159  BP: (!) 169/69 (!) 146/77  Pulse: (!) 59   Resp: 16   Temp: 97.8 F (36.6 C)   TempSrc: Oral   SpO2: 99%   Weight: 179 lb 14.4 oz (81.6 kg)   Height: 5\' 6"  (1.676 m)    Body mass index is 29.04 kg/m.  General: A&O x 3, WDWN, female. Gait: normal HENT: No gross abnormalities. Thinning hair  Eyes: PERRLA. Pulmonary: Respirations are non labored, CTAB, good air movement Cardiac: regular rhythm with occasional premature contractions, no detected murmur.         Carotid Bruits Right Left   Negative Negative   Radial pulses are 2+ palpable bilaterally   Adominal aortic pulse is not palpable                         VASCULAR EXAM: Extremities without ischemic changes, without Gangrene; without open wounds. Left 2nd toe surgically absent, well healed  LE Pulses Right Left       FEMORAL  not palpable  2+ palpable        POPLITEAL  not palpable   not palpable       POSTERIOR TIBIAL  not palpable   not palpable        DORSALIS PEDIS      ANTERIOR TIBIAL not palpable  1+ palpable    Abdomen: soft, NT, no palpable masses, large abdomen. Skin: no rashes, no cellulitis, no ulcers  noted. Musculoskeletal: no muscle wasting or atrophy.  Neurologic: A&O X 3; appropriate affect, Sensation is normal; MOTOR FUNCTION:  moving all extremities equally, motor strength 5/5 throughout. Speech is fluent/normal. CN 2-12 intact. Psychiatric: Thought content is normal, mood appropriate for clinical situation.     ASSESSMENT: Theresa Harrington is a 62 y.o. female who is s/p left femoral-popliteal bypass graft by Dr. Donnetta Hutching on 02/06/2015. Unfortunately this occluded early. Dr. Trula Slade redid her operation with Gore-Tex, 6 mm propaten on 06/09/2014 . She is also s/p left second toe amputation on 07/16/2014. Doppler studies in December 2016 revealed left leg bypass graft occlusion. Her ABI was 0.68.  There are no signs of ischemia in her feet or legs.   Her primary atherosclerotic risk factor is her uncontrolled DM; also former smoker. I advised her to work closely with her PCP to get her DM in as good control as possible.   Right femoral pulse is not palpable. Claudication occurs equally in both calves after 1.5 blocks walking.  See Plan.   DATA  ABI (Date: 09/18/2017):  R:   ABI: 0.54 (was 0.61 on 05-08-16),   PT: mono, dampened  DP: mono, dampened   TBI:  0.23, toe pressure 48 (was 0.28)  L:   ABI: 0.53 (was 0.57),   PT: mono  DP: mono  TBI: 0.32 (was 0.33)  Slight decline in right ABI, moderate disease with dampened monophasic waveforms.  Stable left ABI with moderate disease, monophasic waveforms.     PLAN:  Based on the patient's vascular studies and examination, pt will return to clinic in 1 year with ABI's and right aortoiliac duplex. I advised her to notify us if she develops concerns re the circulation in her feet or legs.   Graduated walking program discussed and how to achieve, to at least 30 minutes daily.    I discussed in depth with the patient the nature of atherosclerosis, and emphasized the importance of maximal medical management including strict  control of blood pressure, blood glucose, and lipid levels, obtaining regular exercise, and continued cessation of smoking.  The patient is aware that without maximal medical management the underlying atherosclerotic disease process will progress, limiting the benefit of any interventions.  The patient was given information about PAD including signs, symptoms, treatment, what symptoms should prompt the patient to seek immediate medical care, and risk reduction measures to take.  Clemon Chambers, RN, MSN, FNP-C [facility] Phone: [phone]  Clinic MD: Early  09/18/17 12:17 PM

## 2017-10-14 ENCOUNTER — Other Ambulatory Visit: Payer: Self-pay | Admitting: Internal Medicine

## 2017-10-15 ENCOUNTER — Ambulatory Visit: Payer: PPO | Admitting: Family

## 2017-10-15 ENCOUNTER — Encounter: Payer: Self-pay | Admitting: Family

## 2017-10-15 ENCOUNTER — Ambulatory Visit (INDEPENDENT_AMBULATORY_CARE_PROVIDER_SITE_OTHER): Payer: PPO | Admitting: Family

## 2017-10-15 ENCOUNTER — Other Ambulatory Visit: Payer: Self-pay | Admitting: Family

## 2017-10-15 VITALS — BP 132/70 | HR 71 | Temp 98.0°F | Ht 66.0 in | Wt 180.1 lb

## 2017-10-15 DIAGNOSIS — L089 Local infection of the skin and subcutaneous tissue, unspecified: Secondary | ICD-10-CM

## 2017-10-15 DIAGNOSIS — E538 Deficiency of other specified B group vitamins: Secondary | ICD-10-CM | POA: Diagnosis not present

## 2017-10-15 DIAGNOSIS — Z23 Encounter for immunization: Secondary | ICD-10-CM | POA: Diagnosis not present

## 2017-10-15 DIAGNOSIS — G63 Polyneuropathy in diseases classified elsewhere: Secondary | ICD-10-CM

## 2017-10-15 MED ORDER — MUPIROCIN CALCIUM 2 % EX CREA: 1.0000 "application " | TOPICAL_CREAM | Freq: Two times a day (BID) | CUTANEOUS | 0 refills | Status: DC

## 2017-10-15 MED ORDER — CYANOCOBALAMIN 1000 MCG/ML IJ SOLN
1000.0000 ug | Freq: Once | INTRAMUSCULAR | Status: AC
  Administered 2017-10-15: 1000 ug via INTRAMUSCULAR

## 2017-10-15 NOTE — Progress Notes (Signed)
Theresa Harrington is a 62 y.o. female with the following history as recorded in EpicCare:  Patient Active Problem List   Diagnosis Date Noted  . Viral URI 05/10/2017  . Atrial fibrillation with RVR ([place]) 11/08/2016  . RTI (respiratory tract infection) 06/27/2016  . Chronic idiopathic constipation 06/05/2016  . Morbid obesity due to excess calories ([place]) 08/04/2015  . Routine general medical examination at a health care facility 01/28/2015  . Visit for screening mammogram 01/27/2015  . Screening for cervical cancer 01/27/2015  . Kidney stone on right side 01/27/2015  . PAD (peripheral artery disease) ([place]) 06/01/2014  . Gallstones 04/28/2014  . Atherosclerosis of native arteries of the extremities with ulceration ([place]) 11/14/2013  . Anemia, iron deficiency 10/28/2013  . Vitamin B12 deficiency neuropathy ([place]) 10/28/2013  . 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 10/09/2013  . Cough 11/27/2012  . GERD (gastroesophageal reflux disease) 08/06/2012  . Allergic rhinitis, cause unspecified 07/04/2012  . Low back pain radiating to both legs 07/04/2012  . GOITER, MULTINODULAR 11/04/2008  . Uncontrolled type 2 diabetes mellitus with peripheral artery disease ([place]) 08/05/2008  . Hyperlipidemia with target LDL less than 100 08/05/2008  . Essential hypertension, benign 08/05/2008    Current Outpatient Medications  Medication Sig Dispense Refill  . Chlorphen-PE-Acetaminophen (NOREL AD) 4-10-325 MG TABS Take 1 tablet by mouth every 6 (six) hours as needed. 84 tablet 0  . chlorpheniramine-HYDROcodone (TUSSIONEX PENNKINETIC ER) 10-8 MG/5ML SUER Take 5 mLs by mouth every 12 (twelve) hours as needed for cough. 140 mL 0  . Continuous Blood Gluc Receiver (FREESTYLE LIBRE READER) DEVI 1 Device by Does not apply route 3 (three) times daily. 1 Device 1  . Continuous Blood Gluc Sensor (FREESTYLE LIBRE SENSOR SYSTEM) MISC 1 Device by Does not apply route every 30 (thirty) days. 3 each 11  .  DEXILANT 60 MG capsule TAKE ONE CAPSULE BY MOUTH ONCE DAILY 90 capsule 1  . diltiazem (CARDIZEM CD) 120 MG 24 hr capsule Take 1 capsule (120 mg total) by mouth daily. 30 capsule 11  . Dulaglutide (TRULICITY) 8.09 XI/3.3AS SOPN Please start 5.05 mg Trulicity under skin weekly 4 pen 1  . ferrous sulfate 325 (65 FE) MG tablet Take 1 tablet (325 mg total) by mouth daily with breakfast. 90 tablet 1  . insulin aspart (NOVOLOG FLEXPEN) 100 UNIT/ML FlexPen Inject 20-24 Units into the skin 3 (three) times daily with meals. 15 mL 11  . Insulin Detemir (LEVEMIR FLEXTOUCH) 100 UNIT/ML Pen Inject 80 Units into the skin daily at 10 pm. DX 250.72 30 mL 3  . Insulin Pen Needle (PEN NEEDLES) 32G X 4 MM MISC Inject 1 pen as directed 4 (four) times daily. Use to inject levemir or novolog as directed. 400 each 3  . latanoprost (XALATAN) 0.005 % ophthalmic solution     . losartan-hydrochlorothiazide (HYZAAR) 100-12.5 MG tablet TAKE 1 TABLET BY MOUTH ONCE DAILY 90 tablet 1  . magnesium oxide (MAG-OX) 400 (241.3 Mg) MG tablet TAKE 1 TABLET BY MOUTH ONCE DAILY 90 tablet 1  . metFORMIN (GLUCOPHAGE) 1000 MG tablet TAKE 1 TABLET BY MOUTH TWICE DAILY WITH MEALS 180 tablet 1  . NOVOLOG FLEXPEN 100 UNIT/ML FlexPen INJECT 10 UNITS SUBCUTANEOUSLY THREE TIMES DAILY WITH MEALS 15 mL 11  . pravastatin (PRAVACHOL) 40 MG tablet TAKE ONE TABLET BY MOUTH ONCE DAILY 90 tablet 3  . rivaroxaban (XARELTO) 20 MG TABS tablet Take 1 tablet (20 mg total) by mouth daily with supper. 90 tablet 3  .  mupirocin cream (BACTROBAN) 2 % Apply 1 application topically 2 (two) times daily. 15 g 0   No current facility-administered medications for this visit.     Allergies: Amlodipine; Atorvastatin; Ampicillin; Codeine; Lisinopril; Penicillins; and Januvia [sitagliptin]  Past Medical History:  Diagnosis Date  . Anemia   . Anxiety   . Asymptomatic cholelithiasis   . Atherosclerosis of aorta ([place])   . GERD (gastroesophageal reflux disease)   .  Hypertension   . Hypothyroidism    no meds  . PAD (peripheral artery disease) ([place])   . Peripheral arterial occlusive disease (HCC)    lower extremities  . Pneumonia   . PONV (postoperative nausea and vomiting)   . Right ureteral stone   . Type 2 diabetes mellitus ([place])   . Wears glasses     Past Surgical History:  Procedure Laterality Date  . AMPUTATION Left 07/16/2014   Procedure: LEFT SECOND TOE AMPUTATION;  Surgeon: Serafina Mitchell, MD;  Location: [place];  Service: Vascular;  Laterality: Left;  With Nerve block  . AUGMENTATION MAMMAPLASTY    . BELPHAROPTOSIS REPAIR     eyelid lift  . COMBINED AUGMENTATION MAMMAPLASTY AND ABDOMINOPLASTY  2009   W/  BILATERAL  THIGH LIFT  . CYSTO/  RIGHT URETERAL STENT PLACEMENT  12-30-2010  . CYSTOSCOPY WITH RETROGRADE PYELOGRAM, URETEROSCOPY AND STENT PLACEMENT Right 10/07/2013   Procedure: CYSTOSCOPY WITH RETROGRADE PYELOGRAM, right URETEROSCOPY AND STENT PLACEMENT, stone extraction;  Surgeon: Arvil Persons, MD;  Location: [facility];  Service: Urology;  Laterality: Right;  . CYSTOSCOPY WITH STENT PLACEMENT Right 06/04/2013   Procedure: CYSTOSCOPY WITH STENT PLACEMENT;  Surgeon: Franchot Gallo, MD;  [facility];  Service: Urology;  Laterality: Right;  . DILATATION & CURETTAGE/HYSTEROSCOPY WITH MYOSURE N/A 04/27/2015   Procedure: DILATATION & CURETTAGE/HYSTEROSCOPY WITH MYOSURE;  Surgeon: Terrance Mass, MD;  Location: [facility];  Service: Gynecology;  Laterality: N/A;  . ENDARTERECTOMY FEMORAL Left 06/08/2014   Procedure: Left Leg Common Femoral and External Iliac  Endartarectomy with patch Angioplasty;  Surgeon: Rosetta Posner, MD;  Location: [place];  Service: Vascular;  Laterality: Left;  . EXTRACORPOREAL SHOCK WAVE LITHOTRIPSY Right 08-04-2013//   06-23-2013//   01-16-2011  . FEMORAL-POPLITEAL BYPASS GRAFT Left 06/08/2014   Procedure: Left Leg Femoral -Popliteal Bypass Graft;  Surgeon: Rosetta Posner, MD;  Location: [place];  Service:  Vascular;  Laterality: Left;  . FEMORAL-POPLITEAL BYPASS GRAFT Left 06/09/2014   Procedure: Left Femoral and Popliteal Exposure; Left Femoral to Anterior Tibial Bypass Graft using Propaten 38mm by 80cm Goretex Graft; Left Tibial Endarterectomy; Left Femoraland Popliteal Thrombectomy ;  Surgeon: Serafina Mitchell, MD;  Location: [place];  Service: Vascular;  Laterality: Left;  . HOLMIUM LASER APPLICATION Right 0/09/2692   Procedure: HOLMIUM LASER APPLICATION;  Surgeon: Arvil Persons, MD;  Location: [facility];  Service: Urology;  Laterality: Right;  . KIDNEY STONE SURGERY  April 2015   1-2 stones  . LAPAROSCOPIC GASTRIC BANDING  05-29-2005  . LITHOTRIPSY  2-3 times  . LOWER EXTREMITY ANGIOGRAM N/A 06/04/2014   Procedure: LOWER EXTREMITY ANGIOGRAM;  Surgeon: Serafina Mitchell, MD;  Location: [facility];  Service: Cardiovascular;  Laterality: N/A;  . ORIF FIFTH METACARPAL [place]  RIGHT HAND  04-21-2002  . REVISION AND RE-SITING LAP-BAND PORT  04-08-2010   W/  UPPER EGD  . RIGHT KNEE PATELLECTOMY W/ REPAIR OF EXTENSOR MECHANISM  04-14-2002  . Toenail removed Left Jan. 21, 2016  2nd toenail-  Dr. Barkley Bruns    Family History  Problem Relation Age of Onset  . Diabetes Father   . Heart disease Father   . Deep vein thrombosis Father   . Hyperlipidemia Father   . Diabetes Sister   . Heart disease Sister   . Deep vein thrombosis Sister   . Hyperlipidemia Sister   . Diabetes Brother   . Heart disease Brother   . Cancer Brother   . Hyperlipidemia Brother   . Colon cancer Maternal Aunt   . Diabetes Brother   . Diabetes Brother   . Kidney disease Mother   . Hyperlipidemia Mother   . Other Mother        AAA   and    Amputation  . Arthritis Other   . Cancer Other        colon  . Hypertension Other   . Stroke Other     Social History   Tobacco Use  . Smoking status: Former Smoker    Packs/day: 1.00    Years: 28.00    Pack years: 28.00    Types: Cigarettes    Last attempt to quit:  09/05/2001    Years since quitting: 16.1  . Smokeless tobacco: Never Used  Substance Use Topics  . Alcohol use: No    Alcohol/week: 0.0 oz    Subjective:  Presents with 2 main concerns:  1) Cut her right thumb on a piece of ice 2 weeks ago; feels that area is healing slowly; wanted to get opinion to make sure not infected; 2) Requesting updated B12 injection today;  She has documented B12 deficiency neuropathy;  Objective:  Vitals:   10/15/17 1604  BP: 132/70  Pulse: 71  Temp: 98 F (36.7 C)  TempSrc: Oral  SpO2: 98%  Weight: 180 lb 1.3 oz (81.7 kg)  Height: 5\' 6"  (1.676 m)    General: Well developed, well nourished, in no acute distress  Skin : Warm and dry. Healing scabbed area on base of right thumb; mild surrounding erythema; no warmth or streaking noted; Head: Normocephalic and atraumatic  Eyes: Sclera and conjunctiva clear; pupils round and reactive to light; extraocular movements intact  Lungs: Respirations unlabored; clear to auscultation bilaterally without wheeze, rales, rhonchi  Neurologic: Alert and oriented; speech intact; face symmetrical; moves all extremities well; CNII-XII intact without focal deficit   Assessment:  1. Skin infection   2. Vitamin B12 deficiency neuropathy ([place])     Plan:  1. Reassurance; do not feel that oral antibiotics needed; Rx for Bactroban apply bid to affected area; follow up worse, no better; update Tdap today; 2. B12 injection given today;   No follow-ups on file.  Orders Placed This Encounter  Procedures  . Tdap vaccine greater than or equal to 7yo IM    Requested Prescriptions    No prescriptions requested or ordered in this encounter

## 2017-10-31 ENCOUNTER — Encounter: Payer: Self-pay | Admitting: Internal Medicine

## 2017-10-31 ENCOUNTER — Other Ambulatory Visit (INDEPENDENT_AMBULATORY_CARE_PROVIDER_SITE_OTHER): Payer: PPO

## 2017-10-31 ENCOUNTER — Ambulatory Visit (INDEPENDENT_AMBULATORY_CARE_PROVIDER_SITE_OTHER): Payer: PPO | Admitting: Internal Medicine

## 2017-10-31 VITALS — BP 120/60 | HR 66 | Temp 98.0°F | Ht 66.0 in | Wt 175.8 lb

## 2017-10-31 DIAGNOSIS — Z0000 Encounter for general adult medical examination without abnormal findings: Secondary | ICD-10-CM

## 2017-10-31 DIAGNOSIS — D508 Other iron deficiency anemias: Secondary | ICD-10-CM | POA: Diagnosis not present

## 2017-10-31 DIAGNOSIS — N3001 Acute cystitis with hematuria: Secondary | ICD-10-CM | POA: Diagnosis not present

## 2017-10-31 DIAGNOSIS — E042 Nontoxic multinodular goiter: Secondary | ICD-10-CM

## 2017-10-31 DIAGNOSIS — R310 Gross hematuria: Secondary | ICD-10-CM | POA: Diagnosis not present

## 2017-10-31 DIAGNOSIS — IMO0002 Reserved for concepts with insufficient information to code with codable children: Secondary | ICD-10-CM

## 2017-10-31 DIAGNOSIS — E1165 Type 2 diabetes mellitus with hyperglycemia: Secondary | ICD-10-CM | POA: Diagnosis not present

## 2017-10-31 DIAGNOSIS — I10 Essential (primary) hypertension: Secondary | ICD-10-CM

## 2017-10-31 DIAGNOSIS — E1151 Type 2 diabetes mellitus with diabetic peripheral angiopathy without gangrene: Secondary | ICD-10-CM

## 2017-10-31 DIAGNOSIS — E785 Hyperlipidemia, unspecified: Secondary | ICD-10-CM | POA: Diagnosis not present

## 2017-10-31 DIAGNOSIS — D539 Nutritional anemia, unspecified: Secondary | ICD-10-CM | POA: Diagnosis not present

## 2017-10-31 LAB — TSH: TSH: 0.24 u[IU]/mL — ABNORMAL LOW (ref 0.35–4.50)

## 2017-10-31 LAB — CBC WITH DIFFERENTIAL/PLATELET
Basophils Absolute: 0 10*3/uL (ref 0.0–0.1)
Basophils Relative: 0.5 % (ref 0.0–3.0)
EOS PCT: 1 % (ref 0.0–5.0)
Eosinophils Absolute: 0.1 10*3/uL (ref 0.0–0.7)
HCT: 39.1 % (ref 36.0–46.0)
HEMOGLOBIN: 12.8 g/dL (ref 12.0–15.0)
LYMPHS PCT: 30.4 % (ref 12.0–46.0)
Lymphs Abs: 3.2 10*3/uL (ref 0.7–4.0)
MCHC: 32.8 g/dL (ref 30.0–36.0)
MCV: 85.1 fl (ref 78.0–100.0)
Monocytes Absolute: 0.6 10*3/uL (ref 0.1–1.0)
Monocytes Relative: 5.8 % (ref 3.0–12.0)
Neutro Abs: 6.5 10*3/uL (ref 1.4–7.7)
Neutrophils Relative %: 62.3 % (ref 43.0–77.0)
Platelets: 282 10*3/uL (ref 150.0–400.0)
RBC: 4.59 Mil/uL (ref 3.87–5.11)
RDW: 15 % (ref 11.5–15.5)
WBC: 10.5 10*3/uL (ref 4.0–10.5)

## 2017-10-31 LAB — URINALYSIS, ROUTINE W REFLEX MICROSCOPIC
BILIRUBIN URINE: NEGATIVE
KETONES UR: NEGATIVE
Nitrite: POSITIVE — AB
PH: 5.5 (ref 5.0–8.0)
Specific Gravity, Urine: >=1.03 — AB (ref 1.000–1.030)
UROBILINOGEN UA: 0.2 (ref 0.0–1.0)
Urine Glucose: NEGATIVE

## 2017-10-31 LAB — BASIC METABOLIC PANEL
BUN: 16 mg/dL (ref 6–23)
CHLORIDE: 100 meq/L (ref 96–112)
CO2: 28 mEq/L (ref 19–32)
Calcium: 9.9 mg/dL (ref 8.4–10.5)
Creatinine, Ser: 0.84 mg/dL (ref 0.40–1.20)
GFR: 88.74 mL/min (ref >60.00)
Glucose, Bld: 144 mg/dL — ABNORMAL HIGH (ref 70–99)
Potassium: 4.3 mEq/L (ref 3.5–5.1)
SODIUM: 137 meq/L (ref 135–145)

## 2017-10-31 LAB — HEMOGLOBIN A1C: HEMOGLOBIN A1C: 9.7 % — AB (ref 4.6–6.5)

## 2017-10-31 LAB — FERRITIN: Ferritin: 12.7 ng/mL (ref 10.0–291.0)

## 2017-10-31 LAB — HM DIABETES FOOT EXAM

## 2017-10-31 LAB — IBC PANEL
Iron: 52 ug/dL (ref 42–145)
SATURATION RATIOS: 13.6 % — AB (ref 20.0–50.0)
Transferrin: 274 mg/dL (ref 212.0–360.0)

## 2017-10-31 LAB — VITAMIN B12: Vitamin B-12: 599 pg/mL (ref 211–911)

## 2017-10-31 LAB — FOLATE: FOLATE: 17.9 ng/mL (ref >5.9)

## 2017-10-31 LAB — VITAMIN D 25 HYDROXY (VIT D DEFICIENCY, FRACTURES): VITD: 34.5 ng/mL (ref 30.00–100.00)

## 2017-10-31 MED ORDER — NITROFURANTOIN MONOHYD MACRO 100 MG PO CAPS
100.0000 mg | ORAL_CAPSULE | Freq: Two times a day (BID) | ORAL | 0 refills | Status: AC
Start: 2017-10-31 — End: 2017-11-07

## 2017-10-31 NOTE — Patient Instructions (Signed)

## 2017-10-31 NOTE — Progress Notes (Signed)
Subjective:  Patient ID: Theresa Harrington, female    DOB: 03/13/1956  Age: 62 y.o. MRN: [id number]  CC: Anemia; Hypertension; Diabetes; Hyperlipidemia; and Annual Exam   HPI Theresa Harrington presents for a CPX.  She complains of a several week history of intermittent hematuria, foul-smelling urine, frequency, and low backache.  She has had a few episodes of nausea but she denies fever, chills, abdominal pain, or vomiting.  The backache started a week ago after she slept on an uncomfortable mattress in [place].  Past Medical History:  Diagnosis Date  . Anemia   . Anxiety   . Asymptomatic cholelithiasis   . Atherosclerosis of aorta ([place])   . GERD (gastroesophageal reflux disease)   . Hypertension   . Hypothyroidism    no meds  . PAD (peripheral artery disease) ([place])   . Peripheral arterial occlusive disease ([facility])    lower extremities  . Pneumonia   . PONV (postoperative nausea and vomiting)   . Right ureteral stone   . Type 2 diabetes mellitus ([place])   . Wears glasses    Past Surgical History:  Procedure Laterality Date  . AMPUTATION Left 07/16/2014   Procedure: LEFT SECOND TOE AMPUTATION;  Surgeon: Serafina Mitchell, MD;  Location: [facility];  Service: Vascular;  Laterality: Left;  With Nerve block  . AUGMENTATION MAMMAPLASTY    . BELPHAROPTOSIS REPAIR     eyelid lift  . COMBINED AUGMENTATION MAMMAPLASTY AND ABDOMINOPLASTY  2009   W/  BILATERAL  THIGH LIFT  . CYSTO/  RIGHT URETERAL STENT PLACEMENT  12-30-2010  . CYSTOSCOPY WITH RETROGRADE PYELOGRAM, URETEROSCOPY AND STENT PLACEMENT Right 10/07/2013   Procedure: CYSTOSCOPY WITH RETROGRADE PYELOGRAM, right URETEROSCOPY AND STENT PLACEMENT, stone extraction;  Surgeon: Arvil Persons, MD;  Location: [facility];  Service: Urology;  Laterality: Right;  . CYSTOSCOPY WITH STENT PLACEMENT Right 06/04/2013   Procedure: CYSTOSCOPY WITH STENT PLACEMENT;  Surgeon: Franchot Gallo, MD;  Location: [facility];  Service: Urology;  Laterality:  Right;  . DILATATION & CURETTAGE/HYSTEROSCOPY WITH MYOSURE N/A 04/27/2015   Procedure: DILATATION & CURETTAGE/HYSTEROSCOPY WITH MYOSURE;  Surgeon: Terrance Mass, MD;  Location: [facility];  Service: Gynecology;  Laterality: N/A;  . ENDARTERECTOMY FEMORAL Left 06/08/2014   Procedure: Left Leg Common Femoral and External Iliac  Endartarectomy with patch Angioplasty;  Surgeon: Rosetta Posner, MD;  Location: [facility];  Service: Vascular;  Laterality: Left;  . EXTRACORPOREAL SHOCK WAVE LITHOTRIPSY Right 08-04-2013//   06-23-2013//   01-16-2011  . FEMORAL-POPLITEAL BYPASS GRAFT Left 06/08/2014   Procedure: Left Leg Femoral -Popliteal Bypass Graft;  Surgeon: Rosetta Posner, MD;  Location: [facility];  Service: Vascular;  Laterality: Left;  . FEMORAL-POPLITEAL BYPASS GRAFT Left 06/09/2014   Procedure: Left Femoral and Popliteal Exposure; Left Femoral to Anterior Tibial Bypass Graft using Propaten 63mm by 80cm Goretex Graft; Left Tibial Endarterectomy; Left Femoraland Popliteal Thrombectomy ;  Surgeon: Serafina Mitchell, MD;  Location: [facility];  Service: Vascular;  Laterality: Left;  . HOLMIUM LASER APPLICATION Right 07/01/6710   Procedure: HOLMIUM LASER APPLICATION;  Surgeon: Arvil Persons, MD;  Location: [facility];  Service: Urology;  Laterality: Right;  . KIDNEY STONE SURGERY  April 2015   1-2 stones  . LAPAROSCOPIC GASTRIC BANDING  05-29-2005  . LITHOTRIPSY  2-3 times  . LOWER EXTREMITY ANGIOGRAM N/A 06/04/2014   Procedure: LOWER EXTREMITY ANGIOGRAM;  Surgeon: Serafina Mitchell, MD;  Location: [facility];  Service: Cardiovascular;  Laterality: N/A;  .  ORIF FIFTH METACARPAL FX  RIGHT HAND  04-21-2002  . REVISION AND RE-SITING LAP-BAND PORT  04-08-2010   W/  UPPER EGD  . RIGHT KNEE PATELLECTOMY W/ REPAIR OF EXTENSOR MECHANISM  04-14-2002  . Toenail removed Left Jan. 21, 2016   2nd toenail-  Dr. Barkley Bruns    reports that she quit smoking about 16 years ago. Her smoking use included cigarettes. She has a 28.00  pack-year smoking history. She has never used smokeless tobacco. She reports that she does not drink alcohol or use drugs. family history includes Arthritis in her other; Cancer in her brother and other; Colon cancer in her maternal aunt; Deep vein thrombosis in her father and sister; Diabetes in her brother, brother, brother, father, and sister; Heart disease in her brother, father, and sister; Hyperlipidemia in her brother, father, mother, and sister; Hypertension in her other; Kidney disease in her mother; Other in her mother; Stroke in her other. Allergies  Allergen Reactions  . Amlodipine Swelling  . Atorvastatin Other (See Comments)    Reaction:  Muscle aches   . Ampicillin Nausea And Vomiting and Other (See Comments)  . Codeine Nausea And Vomiting    Ask patient  . Lisinopril Cough       . Penicillins Nausea And Vomiting and Other (See Comments)  . Januvia [Sitagliptin] Nausea And Vomiting    Outpatient Medications Prior to Visit  Medication Sig Dispense Refill  . Continuous Blood Gluc Receiver (FREESTYLE LIBRE READER) DEVI 1 Device by Does not apply route 3 (three) times daily. 1 Device 1  . Continuous Blood Gluc Sensor (FREESTYLE LIBRE SENSOR SYSTEM) MISC 1 Device by Does not apply route every 30 (thirty) days. 3 each 11  . DEXILANT 60 MG capsule TAKE ONE CAPSULE BY MOUTH ONCE DAILY 90 capsule 1  . diltiazem (CARDIZEM CD) 120 MG 24 hr capsule Take 1 capsule (120 mg total) by mouth daily. 30 capsule 11  . Dulaglutide (TRULICITY) 0.93 GH/8.2XH SOPN Please start 3.71 mg Trulicity under skin weekly 4 pen 1  . ferrous sulfate 325 (65 FE) MG tablet Take 1 tablet (325 mg total) by mouth daily with breakfast. 90 tablet 1  . insulin aspart (NOVOLOG FLEXPEN) 100 UNIT/ML FlexPen Inject 20-24 Units into the skin 3 (three) times daily with meals. 15 mL 11  . Insulin Detemir (LEVEMIR FLEXTOUCH) 100 UNIT/ML Pen Inject 80 Units into the skin daily at 10 pm. DX 250.72 30 mL 3  . Insulin Pen Needle  (PEN NEEDLES) 32G X 4 MM MISC Inject 1 pen as directed 4 (four) times daily. Use to inject levemir or novolog as directed. 400 each 3  . latanoprost (XALATAN) 0.005 % ophthalmic solution     . losartan-hydrochlorothiazide (HYZAAR) 100-12.5 MG tablet TAKE 1 TABLET BY MOUTH ONCE DAILY 90 tablet 1  . magnesium oxide (MAG-OX) 400 (241.3 Mg) MG tablet TAKE 1 TABLET BY MOUTH ONCE DAILY 90 tablet 1  . metFORMIN (GLUCOPHAGE) 1000 MG tablet TAKE 1 TABLET BY MOUTH TWICE DAILY WITH MEALS 180 tablet 1  . NOVOLOG FLEXPEN 100 UNIT/ML FlexPen INJECT 10 UNITS SUBCUTANEOUSLY THREE TIMES DAILY WITH MEALS 15 mL 11  . pravastatin (PRAVACHOL) 40 MG tablet TAKE ONE TABLET BY MOUTH ONCE DAILY 90 tablet 3  . rivaroxaban (XARELTO) 20 MG TABS tablet Take 1 tablet (20 mg total) by mouth daily with supper. 90 tablet 3  . Chlorphen-PE-Acetaminophen (NOREL AD) 4-10-325 MG TABS Take 1 tablet by mouth every 6 (six) hours as needed. 84 tablet 0  .  mupirocin cream (BACTROBAN) 2 % Apply 1 application topically 2 (two) times daily. 15 g 0  . chlorpheniramine-HYDROcodone (TUSSIONEX PENNKINETIC ER) 10-8 MG/5ML SUER Take 5 mLs by mouth every 12 (twelve) hours as needed for cough. 140 mL 0   No facility-administered medications prior to visit.     ROS Review of Systems  Constitutional: Positive for fatigue. Negative for appetite change, chills, fever and unexpected weight change.  HENT: Negative.   Eyes: Negative for visual disturbance.  Respiratory: Negative for cough, chest tightness, shortness of breath and wheezing.   Cardiovascular: Negative for chest pain, palpitations and leg swelling.  Gastrointestinal: Positive for nausea. Negative for abdominal pain, constipation, diarrhea, rectal pain and vomiting.  Endocrine: Negative for polydipsia and polyuria.  Genitourinary: Positive for dysuria, frequency and hematuria. Negative for difficulty urinating, flank pain, pelvic pain, urgency, vaginal bleeding and vaginal discharge.    Musculoskeletal: Positive for back pain. Negative for arthralgias, joint swelling and myalgias.  Skin: Negative.  Negative for color change and rash.  Neurological: Negative.  Negative for dizziness, light-headedness and headaches.  Hematological: Negative.  Negative for adenopathy. Does not bruise/bleed easily.    Objective:  BP 120/60 (BP Location: Left Arm, Patient Position: Sitting, Cuff Size: Normal)   Pulse 66   Temp 98 F (36.7 C) (Oral)   Ht 5\' 6"  (1.676 m)   Wt 175 lb 12 oz (79.7 kg)   LMP  (LMP Unknown)   SpO2 97%   BMI 28.37 kg/m   BP Readings from Last 3 Encounters:  10/31/17 120/60  10/15/17 132/70  09/18/17 (!) 146/77    Wt Readings from Last 3 Encounters:  10/31/17 175 lb 12 oz (79.7 kg)  10/15/17 180 lb 1.3 oz (81.7 kg)  09/18/17 179 lb 14.4 oz (81.6 kg)    Physical Exam  Constitutional: She is oriented to person, place, and time. Vital signs are normal.  Non-toxic appearance. She does not have a sickly appearance. She does not appear ill. No distress.  HENT:  Mouth/Throat: Oropharynx is clear and moist. No oropharyngeal exudate.  Eyes: Conjunctivae are normal. No scleral icterus.  Neck: Normal range of motion. Neck supple. No JVD present. No thyroid mass and no thyromegaly present.  Cardiovascular: Normal rate, regular rhythm and normal heart sounds.  No murmur heard. Pulmonary/Chest: Effort normal and breath sounds normal. She has no wheezes. She has no rales.  Abdominal: Soft. Bowel sounds are normal. She exhibits no mass. There is no hepatosplenomegaly. There is no tenderness.  Musculoskeletal: Normal range of motion. She exhibits no edema, tenderness or deformity.       Lumbar back: Normal. She exhibits normal range of motion, no tenderness, no swelling and no pain.  Lymphadenopathy:    She has no cervical adenopathy.  Neurological: She is alert and oriented to person, place, and time.  Skin: Skin is dry. She is not diaphoretic. No pallor.   Psychiatric: She has a normal mood and affect. Her behavior is normal. Judgment and thought content normal.  Vitals reviewed.   Lab Results  Component Value Date   WBC 10.5 10/31/2017   HGB 12.8 10/31/2017   HCT 39.1 10/31/2017   PLT 282.0 10/31/2017   GLUCOSE 144 (H) 10/31/2017   CHOL 131 02/01/2017   TRIG 138.0 02/01/2017   HDL 50.20 02/01/2017   LDLDIRECT 160.2 03/01/2010   LDLCALC 54 02/01/2017   ALT 10 06/05/2016   AST 10 06/05/2016   NA 137 10/31/2017   K 4.3 10/31/2017   CL  100 10/31/2017   CREATININE 0.84 10/31/2017   BUN 16 10/31/2017   CO2 28 10/31/2017   TSH 0.24 (L) 10/31/2017   INR 1.05 07/16/2014   HGBA1C 9.7 (H) 10/31/2017   MICROALBUR 4.4 (H) 04/04/2017    No results found.  Assessment & Plan:   Theresa Harrington was seen today for anemia, hypertension, diabetes, hyperlipidemia and annual exam.  Diagnoses and all orders for this visit:  GOITER, MULTINODULAR-her thyroid gland feels normal today.  Her TSH is slightly suppressed but she appears euthyroid clinically.  I will continue to monitor this. -     TSH; Future  Other iron deficiency anemia-her H&H are normal now.  I will monitor her iron levels. -     CBC with Differential/Platelet; Future -     IBC panel; Future -     Ferritin; Future  Morbid obesity due to excess calories ([place])  Essential hypertension, benign-her blood pressure is adequately well controlled.  Electrolytes and renal function are normal.  Her vitamin D level is normal. -     Basic metabolic panel; Future -     VITAMIN D 25 Hydroxy (Vit-D Deficiency, Fractures); Future  Hyperlipidemia with target LDL less than 100  Uncontrolled type 2 diabetes mellitus with peripheral artery disease ([place])- Her A1c is at 9.7%.  Her blood sugars are not adequately well controlled.  She will follow-up with endocrinology regarding this. -     Hemoglobin A1c; Future  Deficiency anemia- Her H&H are normal now.  I will monitor her for vitamin deficiencies. -      IBC panel; Future -     Vitamin B12; Future -     Folate; Future -     Ferritin; Future -     Vitamin B1; Future  Routine general medical examination at a health care facility  Hematuria, gross -     Urinalysis, Routine w reflex microscopic; Future -     CULTURE, URINE COMPREHENSIVE; Future  Acute cystitis with hematuria- Her symptoms and UA are consistent with UTI.  Her urine culture is positive for E. coli with culture and sensitivity pending.  We will empirically start treating this with nitrofurantoin. -     nitrofurantoin, macrocrystal-monohydrate, (MACROBID) 100 MG capsule; Take 1 capsule (100 mg total) by mouth 2 (two) times daily for 7 days.   I have discontinued Theresa Harrington's Chlorphen-PE-Acetaminophen, chlorpheniramine-HYDROcodone, and mupirocin cream. I am also having her start on nitrofurantoin (macrocrystal-monohydrate). Additionally, I am having her maintain her pravastatin, diltiazem, Insulin Detemir, FREESTYLE LIBRE READER, FREESTYLE LIBRE SENSOR SYSTEM, insulin aspart, rivaroxaban, Pen Needles, ferrous sulfate, NOVOLOG FLEXPEN, DEXILANT, magnesium oxide, metFORMIN, latanoprost, Dulaglutide, and losartan-hydrochlorothiazide.  Meds ordered this encounter  Medications  . nitrofurantoin, macrocrystal-monohydrate, (MACROBID) 100 MG capsule    Sig: Take 1 capsule (100 mg total) by mouth 2 (two) times daily for 7 days.    Dispense:  14 capsule    Refill:  0   See AVS for instructions about healthy living and anticipatory guidance.   Follow-up: Return in about 6 months (around 05/03/2018).  Scarlette Calico, MD

## 2017-11-02 NOTE — Assessment & Plan Note (Signed)

## 2017-11-03 LAB — CULTURE, URINE COMPREHENSIVE
MICRO NUMBER: 90793046
SPECIMEN QUALITY:: ADEQUATE

## 2017-11-03 LAB — VITAMIN B1: Vitamin B1 (Thiamine): <6 nmol/L — ABNORMAL LOW (ref 8–30)

## 2017-11-05 ENCOUNTER — Encounter: Payer: Self-pay | Admitting: Internal Medicine

## 2017-11-05 ENCOUNTER — Other Ambulatory Visit: Payer: Self-pay | Admitting: Internal Medicine

## 2017-11-05 DIAGNOSIS — E519 Thiamine deficiency, unspecified: Secondary | ICD-10-CM

## 2017-11-05 MED ORDER — VITAMIN B-1 100 MG PO TABS: 100.0000 mg | ORAL_TABLET | Freq: Every day | ORAL | 1 refills | Status: DC

## 2017-11-07 ENCOUNTER — Other Ambulatory Visit: Payer: Self-pay

## 2017-11-07 DIAGNOSIS — E1151 Type 2 diabetes mellitus with diabetic peripheral angiopathy without gangrene: Secondary | ICD-10-CM

## 2017-11-07 MED ORDER — INSULIN DETEMIR 100 UNIT/ML FLEXPEN: 80.0000 [IU] | PEN_INJECTOR | Freq: Every day | SUBCUTANEOUS | 3 refills | Status: DC

## 2017-11-20 ENCOUNTER — Encounter: Payer: Self-pay | Admitting: Internal Medicine

## 2017-11-21 ENCOUNTER — Other Ambulatory Visit: Payer: Self-pay | Admitting: Cardiology

## 2017-11-21 ENCOUNTER — Other Ambulatory Visit: Payer: Self-pay | Admitting: Internal Medicine

## 2017-12-04 ENCOUNTER — Ambulatory Visit: Payer: PPO | Admitting: Internal Medicine

## 2017-12-05 ENCOUNTER — Telehealth: Payer: Self-pay | Admitting: *Deleted

## 2017-12-05 NOTE — Telephone Encounter (Signed)
Patient called requesting to be seen due to "some swelling in left leg" Denies any acute changes in temperature, color or sensation. No complaints of pain voiced. Did not want to see NP, but agreeable to see PA. Appointment given.

## 2017-12-10 ENCOUNTER — Ambulatory Visit: Payer: PPO

## 2017-12-19 ENCOUNTER — Ambulatory Visit (INDEPENDENT_AMBULATORY_CARE_PROVIDER_SITE_OTHER): Payer: PPO | Admitting: Internal Medicine

## 2017-12-19 ENCOUNTER — Encounter: Payer: Self-pay | Admitting: Internal Medicine

## 2017-12-19 VITALS — BP 130/64 | HR 70 | Temp 98.8°F | Resp 16 | Ht 66.0 in | Wt 179.0 lb

## 2017-12-19 DIAGNOSIS — M79662 Pain in left lower leg: Secondary | ICD-10-CM

## 2017-12-19 DIAGNOSIS — E538 Deficiency of other specified B group vitamins: Secondary | ICD-10-CM | POA: Diagnosis not present

## 2017-12-19 DIAGNOSIS — I7025 Atherosclerosis of native arteries of other extremities with ulceration: Secondary | ICD-10-CM

## 2017-12-19 DIAGNOSIS — M7989 Other specified soft tissue disorders: Secondary | ICD-10-CM | POA: Diagnosis not present

## 2017-12-19 MED ORDER — CYANOCOBALAMIN 1000 MCG/ML IJ SOLN
1000.0000 ug | Freq: Once | INTRAMUSCULAR | Status: AC
  Administered 2017-12-19: 1000 ug via INTRAMUSCULAR

## 2017-12-19 NOTE — Patient Instructions (Signed)
Peripheral Vascular Disease Peripheral vascular disease (PVD) is a disease of the blood vessels that are not part of your heart and brain. A simple term for PVD is poor circulation. In most cases, PVD narrows the blood vessels that carry blood from your heart to the rest of your body. This can result in a decreased supply of blood to your arms, legs, and internal organs, like your stomach or kidneys. However, it most often affects a person's lower legs and feet. There are two types of PVD.  Organic PVD. This is the more common type. It is caused by damage to the structure of blood vessels.  Functional PVD. This is caused by conditions that make blood vessels contract and tighten (spasm).  Without treatment, PVD tends to get worse over time. PVD can also lead to acute ischemic limb. This is when an arm or limb suddenly has trouble getting enough blood. This is a medical emergency. What are the causes? Each type of PVD has many different causes. The most common cause of PVD is buildup of a fatty material (plaque) inside of your arteries (atherosclerosis). Small amounts of plaque can break off from the walls of the blood vessels and become lodged in a smaller artery. This blocks blood flow and can cause acute ischemic limb. Other common causes of PVD include:  Blood clots that form inside of blood vessels.  Injuries to blood vessels.  Diseases that cause inflammation of blood vessels or cause blood vessel spasms.  Health behaviors and health history that increase your risk of developing PVD.  What increases the risk? You may have a greater risk of PVD if you:  Have a family history of PVD.  Have certain medical conditions, including: ? High cholesterol. ? Diabetes. ? High blood pressure (hypertension). ? Coronary heart disease. ? Past problems with blood clots. ? Past injury, such as burns or a broken bone. These may have damaged blood vessels in your limbs. ? Buerger disease. This is  caused by inflamed blood vessels in your hands and feet. ? Some forms of arthritis. ? Rare birth defects that affect the arteries in your legs.  Use tobacco.  Do not get enough exercise.  Are obese.  Are age 50 or older.  What are the signs or symptoms? PVD may cause many different symptoms. Your symptoms depend on what part of your body is not getting enough blood. Some common signs and symptoms include:  Cramps in your lower legs. This may be a symptom of poor leg circulation (claudication).  Pain and weakness in your legs while you are physically active that goes away when you rest (intermittent claudication).  Leg pain when at rest.  Leg numbness, tingling, or weakness.  Coldness in a leg or foot, especially when compared with the other leg.  Skin or hair changes. These can include: ? Hair loss. ? Shiny skin. ? Pale or bluish skin. ? Thick toenails.  Inability to get or maintain an erection (erectile dysfunction).  People with PVD are more prone to developing ulcers and sores on their toes, feet, or legs. These may take longer than normal to heal. How is this diagnosed? Your health care provider may diagnose PVD from your signs and symptoms. The health care provider will also do a physical exam. You may have tests to find out what is causing your PVD and determine its severity. Tests may include:  Blood pressure recordings from your arms and legs and measurements of the strength of your pulses (  pulse volume recordings).  Imaging studies using sound waves to take pictures of the blood flow through your blood vessels (Doppler ultrasound).  Injecting a dye into your blood vessels before having imaging studies using: ? X-rays (angiogram or arteriogram). ? Computer-generated X-rays (CT angiogram). ? A powerful electromagnetic field and a computer (magnetic resonance angiogram or MRA).  How is this treated? Treatment for PVD depends on the cause of your condition and the  severity of your symptoms. It also depends on your age. Underlying causes need to be treated and controlled. These include long-lasting (chronic) conditions, such as diabetes, high cholesterol, and high blood pressure. You may need to first try making lifestyle changes and taking medicines. Surgery may be needed if these do not work. Lifestyle changes may include:  Quitting smoking.  Exercising regularly.  Following a low-fat, low-cholesterol diet.  Medicines may include:  Blood thinners to prevent blood clots.  Medicines to improve blood flow.  Medicines to improve your blood cholesterol levels.  Surgical procedures may include:  A procedure that uses an inflated balloon to open a blocked artery and improve blood flow (angioplasty).  A procedure to put in a tube (stent) to keep a blocked artery open (stent implant).  Surgery to reroute blood flow around a blocked artery (peripheral bypass surgery).  Surgery to remove dead tissue from an infected wound on the affected limb.  Amputation. This is surgical removal of the affected limb. This may be necessary in cases of acute ischemic limb that are not improved through medical or surgical treatments.  Follow these instructions at home:  Take medicines only as directed by your health care provider.  Do not use any tobacco products, including cigarettes, chewing tobacco, or electronic cigarettes. If you need help quitting, ask your health care provider.  Lose weight if you are overweight, and maintain a healthy weight as directed by your health care provider.  Eat a diet that is low in fat and cholesterol. If you need help, ask your health care provider.  Exercise regularly. Ask your health care provider to suggest some good activities for you.  Use compression stockings or other mechanical devices as directed by your health care provider.  Take good care of your feet. ? Wear comfortable shoes that fit well. ? Check your feet  often for any cuts or sores. Contact a health care provider if:  You have cramps in your legs while walking.  You have leg pain when you are at rest.  You have coldness in a leg or foot.  Your skin changes.  You have erectile dysfunction.  You have cuts or sores on your feet that are not healing. Get help right away if:  Your arm or leg turns cold and blue.  Your arms or legs become red, warm, swollen, painful, or numb.  You have chest pain or trouble breathing.  You suddenly have weakness in your face, arm, or leg.  You become very confused or lose the ability to speak.  You suddenly have a very bad headache or lose your vision. This information is not intended to replace advice given to you by your health care provider. Make sure you discuss any questions you have with your health care provider. Document Released: 05/25/2004 Document Revised: 09/23/2015 Document Reviewed: 09/25/2013 Elsevier Interactive Patient Education  2017 [employer]  

## 2017-12-19 NOTE — Progress Notes (Signed)
Subjective:  Patient ID: Theresa Harrington, female    DOB: 10-25-1955  Age: 62 y.o. MRN: [id number]  CC: Follow-up   HPI Theresa Harrington presents for f/up - She complains of a several week history of vague swelling in her left calf.  She feels like the left calf is bigger than the right calf.  She has the same degree of claudication is usual.  She can walk for long distances but eventually develops claudication in both lower extremities.  She does not have any ulcers or sores on her feet or toes.  Outpatient Medications Prior to Visit  Medication Sig Dispense Refill  . Continuous Blood Gluc Receiver (FREESTYLE LIBRE READER) DEVI 1 Device by Does not apply route 3 (three) times daily. 1 Device 1  . Continuous Blood Gluc Sensor (FREESTYLE LIBRE SENSOR SYSTEM) MISC 1 Device by Does not apply route every 30 (thirty) days. 3 each 11  . DEXILANT 60 MG capsule TAKE ONE CAPSULE BY MOUTH ONCE DAILY 90 capsule 1  . diltiazem (CARTIA XT) 120 MG 24 hr capsule Take 1 capsule (120 mg total) by mouth daily. 90 capsule 2  . ferrous sulfate 325 (65 FE) MG tablet Take 1 tablet (325 mg total) by mouth daily with breakfast. 90 tablet 1  . insulin aspart (NOVOLOG FLEXPEN) 100 UNIT/ML FlexPen Inject 20-24 Units into the skin 3 (three) times daily with meals. 15 mL 11  . Insulin Detemir (LEVEMIR FLEXTOUCH) 100 UNIT/ML Pen Inject 80 Units into the skin daily at 10 pm. DX 250.72 30 mL 3  . Insulin Pen Needle (PEN NEEDLES) 32G X 4 MM MISC Inject 1 pen as directed 4 (four) times daily. Use to inject levemir or novolog as directed. 400 each 3  . latanoprost (XALATAN) 0.005 % ophthalmic solution     . losartan-hydrochlorothiazide (HYZAAR) 100-12.5 MG tablet TAKE 1 TABLET BY MOUTH ONCE DAILY 90 tablet 1  . magnesium oxide (MAG-OX) 400 (241.3 Mg) MG tablet TAKE 1 TABLET BY MOUTH ONCE DAILY 90 tablet 1  . metFORMIN (GLUCOPHAGE) 1000 MG tablet TAKE 1 TABLET BY MOUTH TWICE DAILY WITH MEALS 180 tablet 1  . NOVOLOG FLEXPEN 100 UNIT/ML  FlexPen INJECT 10 UNITS SUBCUTANEOUSLY THREE TIMES DAILY WITH MEALS 15 mL 11  . pravastatin (PRAVACHOL) 40 MG tablet TAKE ONE TABLET BY MOUTH ONCE DAILY 90 tablet 3  . rivaroxaban (XARELTO) 20 MG TABS tablet Take 1 tablet (20 mg total) by mouth daily with supper. 90 tablet 3  . thiamine (VITAMIN B-1) 100 MG tablet Take 1 tablet (100 mg total) by mouth daily. 90 tablet 1  . TRULICITY 1.82 XH/3.7JI SOPN PLEASE START INJECTING 0.75MG  UNDER THE SKIN WEEKLY 4 pen 1   No facility-administered medications prior to visit.     ROS Review of Systems  Constitutional: Negative.   HENT: Negative.   Eyes: Negative.   Respiratory: Negative for cough and chest tightness.   Cardiovascular: Positive for leg swelling. Negative for chest pain and palpitations.  Gastrointestinal: Negative for abdominal pain and diarrhea.  Endocrine: Negative.   Genitourinary: Negative.  Negative for difficulty urinating.  Musculoskeletal: Negative.   Skin: Negative.  Negative for color change, pallor and rash.  Neurological: Negative.  Negative for dizziness, weakness and light-headedness.  Hematological: Negative for adenopathy. Does not bruise/bleed easily.  Psychiatric/Behavioral: Negative.     Objective:  BP 130/64 (BP Location: Left Arm, Patient Position: Sitting, Cuff Size: Normal)   Pulse 70   Temp 98.8 F (37.1 C) (Oral)   Resp  16   Ht 5\' 6"  (1.676 m)   Wt 179 lb (81.2 kg)   LMP  (LMP Unknown)   SpO2 95%   BMI 28.89 kg/m   BP Readings from Last 3 Encounters:  12/19/17 130/64  10/31/17 120/60  10/15/17 132/70    Wt Readings from Last 3 Encounters:  12/19/17 179 lb (81.2 kg)  10/31/17 175 lb 12 oz (79.7 kg)  10/15/17 180 lb 1.3 oz (81.7 kg)    Physical Exam  Constitutional: She is oriented to person, place, and time. No distress.  HENT:  Mouth/Throat: Oropharynx is clear and moist. No oropharyngeal exudate.  Eyes: Conjunctivae are normal. No scleral icterus.  Neck: Normal range of motion.  Neck supple. No JVD present. No thyromegaly present.  Cardiovascular: Normal rate, regular rhythm and normal heart sounds. Exam reveals no gallop and no friction rub.  No murmur heard. Pulses:      Dorsalis pedis pulses are 0 on the right side, and 0 on the left side.       Posterior tibial pulses are 0 on the right side, and 0 on the left side.  There is good capillary refill in both feet and both sets of toes. Left #2 toe has been amputated.  Pulmonary/Chest: Effort normal and breath sounds normal. She has no wheezes. She has no rales.  Abdominal: Soft. Bowel sounds are normal. She exhibits no mass. There is no hepatosplenomegaly. There is no tenderness.  Musculoskeletal: Normal range of motion. She exhibits no edema or deformity.       Right foot: There is normal range of motion and no deformity.       Left foot: There is normal range of motion.  There is no lower extremity edema.  Her left calf is the same size as the right calf.  The calves are not tender or swollen.  Feet:  Right Foot:  Skin Integrity: Negative for ulcer, blister, skin breakdown, erythema, warmth, callus or dry skin.  Left Foot:  Skin Integrity: Negative for ulcer, blister, skin breakdown, erythema, warmth, callus or dry skin.  Lymphadenopathy:    She has no cervical adenopathy.  Neurological: She is alert and oriented to person, place, and time.  Skin: Skin is warm and dry. She is not diaphoretic. No pallor.  Vitals reviewed.   Lab Results  Component Value Date   WBC 10.5 10/31/2017   HGB 12.8 10/31/2017   HCT 39.1 10/31/2017   PLT 282.0 10/31/2017   GLUCOSE 144 (H) 10/31/2017   CHOL 131 02/01/2017   TRIG 138.0 02/01/2017   HDL 50.20 02/01/2017   LDLDIRECT 160.2 03/01/2010   LDLCALC 54 02/01/2017   ALT 10 06/05/2016   AST 10 06/05/2016   NA 137 10/31/2017   K 4.3 10/31/2017   CL 100 10/31/2017   CREATININE 0.84 10/31/2017   BUN 16 10/31/2017   CO2 28 10/31/2017   TSH 0.24 (L) 10/31/2017   INR 1.05  07/16/2014   HGBA1C 9.7 (H) 10/31/2017   MICROALBUR 4.4 (H) 04/04/2017    No results found.  Assessment & Plan:   Theresa Harrington was seen today for follow-up.  Diagnoses and all orders for this visit:  Pain and swelling of left lower leg-I do not see anything concerning on physical examination.  She is already anticoagulated with Xarelto.  She will undergo an ultrasound to see if there is a deep venous thrombosis. -     VAS [place] LOWER EXTREMITY VENOUS (DVT); Future  Atherosclerosis of native arteries of  the extremities with ulceration (Theresa Harrington)- I have ordered arterial flow studies in her lower extremities to see if there has been some worsening of the PAD. -     VAS [place] ABI WITH/WO TBI; Future  B12 deficiency -     cyanocobalamin ((VITAMIN B-12)) injection 1,000 mcg   I am having Theresa Harrington maintain her pravastatin, FREESTYLE LIBRE READER, FREESTYLE LIBRE SENSOR SYSTEM, insulin aspart, rivaroxaban, Pen Needles, ferrous sulfate, NOVOLOG FLEXPEN, DEXILANT, magnesium oxide, metFORMIN, latanoprost, losartan-hydrochlorothiazide, thiamine, Insulin Detemir, diltiazem, and TRULICITY. We administered cyanocobalamin.  Meds ordered this encounter  Medications  . cyanocobalamin ((VITAMIN B-12)) injection 1,000 mcg     Follow-up: No follow-ups on file.  Scarlette Calico, MD

## 2017-12-20 ENCOUNTER — Encounter (HOSPITAL_COMMUNITY): Payer: PPO

## 2017-12-21 ENCOUNTER — Encounter (HOSPITAL_COMMUNITY): Payer: PPO

## 2017-12-24 ENCOUNTER — Telehealth: Payer: Self-pay | Admitting: Internal Medicine

## 2017-12-24 MED ORDER — RIVAROXABAN 20 MG PO TABS: 20.0000 mg | ORAL_TABLET | Freq: Every day | ORAL | 3 refills | Status: DC

## 2017-12-24 NOTE — Telephone Encounter (Signed)
erx has been sent.  

## 2017-12-24 NOTE — Telephone Encounter (Signed)
rivaroxaban (XARELTO) 20 MG TABS tablet   Requesting a 90 days supply to be sent to [facility] on file in [place].

## 2018-01-08 DIAGNOSIS — M21611 Bunion of right foot: Secondary | ICD-10-CM | POA: Diagnosis not present

## 2018-01-08 DIAGNOSIS — Z8631 Personal history of diabetic foot ulcer: Secondary | ICD-10-CM | POA: Diagnosis not present

## 2018-01-08 DIAGNOSIS — S98132A Complete traumatic amputation of one left lesser toe, initial encounter: Secondary | ICD-10-CM | POA: Diagnosis not present

## 2018-01-08 DIAGNOSIS — M21612 Bunion of left foot: Secondary | ICD-10-CM | POA: Diagnosis not present

## 2018-01-08 DIAGNOSIS — E1151 Type 2 diabetes mellitus with diabetic peripheral angiopathy without gangrene: Secondary | ICD-10-CM | POA: Diagnosis not present

## 2018-01-08 LAB — HM DIABETES EYE EXAM

## 2018-01-18 ENCOUNTER — Other Ambulatory Visit: Payer: Self-pay | Admitting: Internal Medicine

## 2018-01-18 DIAGNOSIS — E1151 Type 2 diabetes mellitus with diabetic peripheral angiopathy without gangrene: Secondary | ICD-10-CM

## 2018-01-18 DIAGNOSIS — I739 Peripheral vascular disease, unspecified: Secondary | ICD-10-CM

## 2018-01-18 DIAGNOSIS — I7025 Atherosclerosis of native arteries of other extremities with ulceration: Secondary | ICD-10-CM

## 2018-02-04 ENCOUNTER — Other Ambulatory Visit: Payer: Self-pay | Admitting: Internal Medicine

## 2018-02-04 DIAGNOSIS — E1151 Type 2 diabetes mellitus with diabetic peripheral angiopathy without gangrene: Secondary | ICD-10-CM

## 2018-02-19 ENCOUNTER — Ambulatory Visit: Payer: PPO | Admitting: Internal Medicine

## 2018-03-05 ENCOUNTER — Telehealth: Payer: Self-pay

## 2018-03-05 ENCOUNTER — Encounter

## 2018-03-05 ENCOUNTER — Ambulatory Visit (INDEPENDENT_AMBULATORY_CARE_PROVIDER_SITE_OTHER): Payer: PPO | Admitting: Internal Medicine

## 2018-03-05 ENCOUNTER — Other Ambulatory Visit (INDEPENDENT_AMBULATORY_CARE_PROVIDER_SITE_OTHER): Payer: PPO

## 2018-03-05 ENCOUNTER — Encounter: Payer: Self-pay | Admitting: Internal Medicine

## 2018-03-05 VITALS — BP 136/66 | HR 76 | Temp 97.6°F | Ht 66.0 in | Wt 182.0 lb

## 2018-03-05 DIAGNOSIS — D509 Iron deficiency anemia, unspecified: Secondary | ICD-10-CM

## 2018-03-05 DIAGNOSIS — E785 Hyperlipidemia, unspecified: Secondary | ICD-10-CM

## 2018-03-05 DIAGNOSIS — M545 Low back pain, unspecified: Secondary | ICD-10-CM

## 2018-03-05 DIAGNOSIS — E519 Thiamine deficiency, unspecified: Secondary | ICD-10-CM

## 2018-03-05 DIAGNOSIS — E1151 Type 2 diabetes mellitus with diabetic peripheral angiopathy without gangrene: Secondary | ICD-10-CM

## 2018-03-05 DIAGNOSIS — Z23 Encounter for immunization: Secondary | ICD-10-CM

## 2018-03-05 DIAGNOSIS — IMO0002 Reserved for concepts with insufficient information to code with codable children: Secondary | ICD-10-CM

## 2018-03-05 DIAGNOSIS — Z1211 Encounter for screening for malignant neoplasm of colon: Secondary | ICD-10-CM

## 2018-03-05 DIAGNOSIS — E1165 Type 2 diabetes mellitus with hyperglycemia: Secondary | ICD-10-CM | POA: Diagnosis not present

## 2018-03-05 DIAGNOSIS — E538 Deficiency of other specified B group vitamins: Secondary | ICD-10-CM

## 2018-03-05 LAB — IBC PANEL
Iron: 28 ug/dL — ABNORMAL LOW (ref 42–145)
SATURATION RATIOS: 7.5 % — AB (ref 20.0–50.0)
Transferrin: 268 mg/dL (ref 212.0–360.0)

## 2018-03-05 LAB — URINALYSIS, ROUTINE W REFLEX MICROSCOPIC
Leukocytes, UA: NEGATIVE
Nitrite: NEGATIVE
Urobilinogen, UA: 0.2 (ref 0.0–1.0)
pH: 5.5 (ref 5.0–8.0)

## 2018-03-05 LAB — FERRITIN: FERRITIN: 11.9 ng/mL (ref 10.0–291.0)

## 2018-03-05 LAB — LIPID PANEL
Cholesterol: 148 mg/dL (ref 0–200)
HDL: 51.5 mg/dL (ref >39.00)
LDL CALC: 63 mg/dL (ref 0–99)
NONHDL: 96.31
Total CHOL/HDL Ratio: 3
Triglycerides: 166 mg/dL — ABNORMAL HIGH (ref 0.0–149.0)
VLDL: 33.2 mg/dL (ref 0.0–40.0)

## 2018-03-05 LAB — POCT GLYCOSYLATED HEMOGLOBIN (HGB A1C): Hemoglobin A1C: 9.1 % — AB (ref 4.0–5.6)

## 2018-03-05 LAB — BASIC METABOLIC PANEL
BUN: 15 mg/dL (ref 6–23)
CALCIUM: 9.7 mg/dL (ref 8.4–10.5)
CO2: 28 mEq/L (ref 19–32)
Chloride: 101 mEq/L (ref 96–112)
Creatinine, Ser: 0.79 mg/dL (ref 0.40–1.20)
GFR: 95.14 mL/min (ref >60.00)
GLUCOSE: 261 mg/dL — AB (ref 70–99)
POTASSIUM: 3.6 meq/L (ref 3.5–5.1)
Sodium: 139 mEq/L (ref 135–145)

## 2018-03-05 LAB — CBC WITH DIFFERENTIAL/PLATELET
BASOS ABS: 0 10*3/uL (ref 0.0–0.1)
Basophils Relative: 0.4 % (ref 0.0–3.0)
EOS PCT: 0.7 % (ref 0.0–5.0)
Eosinophils Absolute: 0.1 10*3/uL (ref 0.0–0.7)
HCT: 37.9 % (ref 36.0–46.0)
Hemoglobin: 12.7 g/dL (ref 12.0–15.0)
LYMPHS PCT: 21.8 % (ref 12.0–46.0)
Lymphs Abs: 2.1 10*3/uL (ref 0.7–4.0)
MCHC: 33.4 g/dL (ref 30.0–36.0)
MCV: 83.6 fl (ref 78.0–100.0)
Monocytes Absolute: 0.7 10*3/uL (ref 0.1–1.0)
Monocytes Relative: 7.2 % (ref 3.0–12.0)
Neutro Abs: 6.8 10*3/uL (ref 1.4–7.7)
Neutrophils Relative %: 69.9 % (ref 43.0–77.0)
Platelets: 278 10*3/uL (ref 150.0–400.0)
RBC: 4.53 Mil/uL (ref 3.87–5.11)
RDW: 15.2 % (ref 11.5–15.5)
WBC: 9.8 10*3/uL (ref 4.0–10.5)

## 2018-03-05 LAB — MICROALBUMIN / CREATININE URINE RATIO
Creatinine,U: 191.6 mg/dL
MICROALB UR: 6.3 mg/dL — AB (ref 0.0–1.9)
Microalb Creat Ratio: 3.3 mg/g (ref 0.0–30.0)

## 2018-03-05 MED ORDER — TRAMADOL HCL 50 MG PO TABS: 50.0000 mg | ORAL_TABLET | Freq: Four times a day (QID) | ORAL | 3 refills | Status: DC | PRN

## 2018-03-05 MED ORDER — CYANOCOBALAMIN 1000 MCG/ML IJ SOLN
1000.0000 ug | Freq: Once | INTRAMUSCULAR | Status: AC
  Administered 2018-03-05: 1000 ug via INTRAMUSCULAR

## 2018-03-05 NOTE — Progress Notes (Signed)
Subjective:  Patient ID: Theresa Harrington, female    DOB: [birth year]  Age: 62 y.o. MRN: [id number]  CC: Anemia; Hyperlipidemia; Diabetes; and Back Pain   HPI Theresa Harrington presents for f/up - She complains of recurrent episodes of chronic low back pain that does not radiate into her lower extremities.  She has been taking ibuprofen for symptom relief.  She denies paresthesias in her lower extremities.  She has a history of anemia.  She is not currently taking an iron supplement.  She is compliant with the B12 replacement.  She offers no other complaints today.  Outpatient Medications Prior to Visit  Medication Sig Dispense Refill  . Continuous Blood Gluc Receiver (FREESTYLE LIBRE READER) DEVI 1 Device by Does not apply route 3 (three) times daily. 1 Device 1  . Continuous Blood Gluc Sensor (FREESTYLE LIBRE SENSOR SYSTEM) MISC 1 Device by Does not apply route every 30 (thirty) days. 3 each 11  . DEXILANT 60 MG capsule TAKE ONE CAPSULE BY MOUTH ONCE DAILY 90 capsule 1  . diltiazem (CARTIA XT) 120 MG 24 hr capsule Take 1 capsule (120 mg total) by mouth daily. 90 capsule 2  . ferrous sulfate 325 (65 FE) MG tablet Take 1 tablet (325 mg total) by mouth daily with breakfast. 90 tablet 1  . insulin aspart (NOVOLOG FLEXPEN) 100 UNIT/ML FlexPen Inject 20-24 Units into the skin 3 (three) times daily with meals. 15 mL 11  . Insulin Detemir (LEVEMIR FLEXTOUCH) 100 UNIT/ML Pen Inject 80 Units into the skin daily at 10 pm. DX 250.72 30 mL 3  . Insulin Pen Needle (PEN NEEDLES) 32G X 4 MM MISC Inject 1 pen as directed 4 (four) times daily. Use to inject levemir or novolog as directed. 400 each 3  . latanoprost (XALATAN) 0.005 % ophthalmic solution     . losartan-hydrochlorothiazide (HYZAAR) 100-12.5 MG tablet TAKE 1 TABLET BY MOUTH ONCE DAILY 90 tablet 1  . magnesium oxide (MAG-OX) 400 (241.3 Mg) MG tablet TAKE 1 TABLET BY MOUTH ONCE DAILY 90 tablet 1  . metFORMIN (GLUCOPHAGE) 1000 MG tablet TAKE 1 TABLET BY MOUTH  TWICE DAILY WITH MEALS 180 tablet 1  . NOVOLOG FLEXPEN 100 UNIT/ML FlexPen INJECT 10 UNITS SUBCUTANEOUSLY THREE TIMES DAILY WITH MEALS 15 mL 11  . pravastatin (PRAVACHOL) 40 MG tablet Take 1 tablet (40 mg total) by mouth daily. 90 tablet 3  . rivaroxaban (XARELTO) 20 MG TABS tablet Take 1 tablet (20 mg total) by mouth daily with supper. 90 tablet 3  . thiamine (VITAMIN B-1) 100 MG tablet Take 1 tablet (100 mg total) by mouth daily. 90 tablet 1  . TRULICITY 5.63 OV/5.6EP SOPN PLEASE START INJECTING 0.75MG  UNDER THE SKIN WEEKLY 4 pen 1   No facility-administered medications prior to visit.     ROS Review of Systems  Constitutional: Positive for fatigue. Negative for diaphoresis and unexpected weight change.  HENT: Negative for trouble swallowing.   Eyes: Negative.   Respiratory: Negative for cough, chest tightness, shortness of breath and wheezing.   Cardiovascular: Negative for chest pain, palpitations and leg swelling.  Gastrointestinal: Negative for abdominal pain, blood in stool, constipation, diarrhea and nausea.  Endocrine: Negative for polydipsia, polyphagia and polyuria.  Genitourinary: Negative for difficulty urinating, dysuria and hematuria.  Musculoskeletal: Positive for back pain. Negative for arthralgias and myalgias.  Skin: Negative.  Negative for color change and pallor.  Neurological: Negative.  Negative for dizziness, weakness, light-headedness and numbness.  Hematological: Negative for adenopathy. Does not bruise/bleed  easily.  Psychiatric/Behavioral: Negative.     Objective:  BP 136/66 (BP Location: Left Arm, Patient Position: Sitting, Cuff Size: Normal)   Pulse 76   Temp 97.6 F (36.4 C) (Oral)   Ht 5\' 6"  (1.676 m)   Wt 182 lb (82.6 kg)   LMP  (LMP Unknown)   SpO2 97%   BMI 29.38 kg/m   BP Readings from Last 3 Encounters:  03/05/18 136/66  12/19/17 130/64  10/31/17 120/60    Wt Readings from Last 3 Encounters:  03/05/18 182 lb (82.6 kg)  12/19/17 179  lb (81.2 kg)  10/31/17 175 lb 12 oz (79.7 kg)    Physical Exam  Constitutional: She is oriented to person, place, and time. No distress.  HENT:  Mouth/Throat: Oropharynx is clear and moist. Mucous membranes are pale. No oropharyngeal exudate.  Eyes: Conjunctivae are normal. No scleral icterus.  Neck: Normal range of motion. Neck supple. No JVD present. No thyromegaly present.  Cardiovascular: Normal rate, regular rhythm and normal heart sounds. Exam reveals no gallop.  No murmur heard. Pulmonary/Chest: Effort normal and breath sounds normal. She has no wheezes. She has no rales.  Abdominal: Soft. Bowel sounds are normal. She exhibits no mass. There is no hepatosplenomegaly. There is no tenderness.  Musculoskeletal: Normal range of motion. She exhibits no edema, tenderness or deformity.       Lumbar back: Normal. She exhibits normal range of motion, no tenderness, no swelling, no edema, no deformity and no pain.  Lymphadenopathy:    She has no cervical adenopathy.  Neurological: She is alert and oriented to person, place, and time. She has normal strength and normal reflexes. She displays no atrophy and no tremor. No cranial nerve deficit or sensory deficit. She displays a negative Romberg sign. Coordination and gait normal.  Neg SLR in BLE  Skin: She is not diaphoretic.  Vitals reviewed.   Lab Results  Component Value Date   WBC 9.8 03/05/2018   HGB 12.7 03/05/2018   HCT 37.9 03/05/2018   PLT 278.0 03/05/2018   GLUCOSE 261 (H) 03/05/2018   CHOL 148 03/05/2018   TRIG 166.0 (H) 03/05/2018   HDL 51.50 03/05/2018   LDLDIRECT 160.2 03/01/2010   LDLCALC 63 03/05/2018   ALT 10 06/05/2016   AST 10 06/05/2016   NA 139 03/05/2018   K 3.6 03/05/2018   CL 101 03/05/2018   CREATININE 0.79 03/05/2018   BUN 15 03/05/2018   CO2 28 03/05/2018   TSH 0.24 (L) 10/31/2017   INR 1.05 07/16/2014   HGBA1C 9.1 (A) 03/05/2018   MICROALBUR 6.3 (H) 03/05/2018    Vas [place] Burnard Bunting With/wo Tbi  Result  Date: 09/19/2017 LOWER EXTREMITY DOPPLER STUDY Indications: Peripheral artery disease. High Risk         Hypertension, hyperlipidemia, diabetes, past history of Factors:          smoking.  Vascular Interventions: 07/16/14: Left second toe amputation; 06/08/14: left EIA                         and CFA angioplasty, left femoral to BK pop BPG with                         failure; 06/09/14: new left femoral to BK pop BPG, left                         ATA endarterectomy, thrombectomy  left pop artery.  Examination Guidelines: A complete evaluation includes B-mode imaging, spectral Doppler, color Doppler, and power Doppler as needed of all accessible portions of each vessel. Bilateral testing is considered an integral part of a complete examination. Limited examinations for reoccurring indications may be performed as noted.  ABI Findings: +---------+------------------+-----+-------------------+--------+ Right    Rt Pressure (mmHg)IndexWaveform           Comment  +---------+------------------+-----+-------------------+--------+ Brachial 196                                                +---------+------------------+-----+-------------------+--------+ PTA      111               0.54 dampened monophasic         +---------+------------------+-----+-------------------+--------+ DP       76                0.37 dampened monophasic         +---------+------------------+-----+-------------------+--------+ Great Toe48                0.23                             +---------+------------------+-----+-------------------+--------+ +---------+------------------+-----+----------+-------+ Left     Lt Pressure (mmHg)IndexWaveform  Comment +---------+------------------+-----+----------+-------+ Brachial 206                                      +---------+------------------+-----+----------+-------+ PTA      109               0.53 monophasic         +---------+------------------+-----+----------+-------+ DP       83                0.40 monophasic        +---------+------------------+-----+----------+-------+ Great Toe66                0.32                   +---------+------------------+-----+----------+-------+ +-------+-----------+-----------+------------+------------+ ABI/TBIToday's ABIToday's TBIPrevious ABIPrevious TBI +-------+-----------+-----------+------------+------------+ Right  0.54       0.23       0.61        0.28         +-------+-----------+-----------+------------+------------+ Left   0.53       0.32       0.57        0.33         +-------+-----------+-----------+------------+------------+ Bilateral ABIs and TBIs appear essentially unchanged compared to prior study on 05/08/16.  Final Interpretation: Right: Resting right ankle-brachial index indicates moderate right lower extremity arterial disease. The right toe-brachial index is abnormal. RT great toe pressure = 48 mmHg. Left: Resting left ankle-brachial index indicates moderate left lower extremity arterial disease. The left toe-brachial index is abnormal. LT Great toe pressure = 66 mmHg.  *See table(s) above for measurements and observations.  Electronically signed by Curt Jews on 09/19/2017 at 3:27:53 PM.    Final     Assessment & Plan:   Theresa Harrington was seen today for anemia, hyperlipidemia, diabetes and back pain.  Diagnoses and all orders for this visit:  Hyperlipidemia with target LDL less than 100-he has achieved her LDL goal is doing  well on the statin. -     Lipid panel; Future  Iron deficiency anemia, unspecified iron deficiency anemia type-her iron level is low.  She is status post bariatric surgery so I am concerned she is not absorbing the oral iron.  I have recommended that she receive an iron infusion. 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  She was encouraged to work diligently to continue to control her blood sugars. -     POCT glycosylated hemoglobin (Hb A1C) -     Basic metabolic panel; Future -     Microalbumin / creatinine urine ratio; Future -     Urinalysis, Routine w reflex microscopic; Future  Low back pain radiating to both legs- I reminded her that it is not safe for her to take ibuprofen.  She has no alarming symptoms and a normal exam.  I recommended that she try to control the pain with tramadol as needed. -     traMADol (ULTRAM) 50 MG tablet; Take 1 tablet (50 mg total) by mouth every 6 (six) hours as needed.   I am having Theresa Harrington start on traMADol. I am also having her maintain her FREESTYLE LIBRE READER, [place], insulin aspart, Pen Needles, ferrous sulfate, NOVOLOG FLEXPEN, DEXILANT, magnesium oxide, latanoprost, losartan-hydrochlorothiazide, thiamine, Insulin Detemir, diltiazem, TRULICITY, rivaroxaban, pravastatin, and metFORMIN. We administered cyanocobalamin.  Meds ordered this encounter  Medications  . cyanocobalamin ((VITAMIN B-12)) injection 1,000 mcg  . traMADol (ULTRAM) 50 MG tablet    Sig: Take 1 tablet (50 mg total) by mouth every 6 (six) hours as needed.    Dispense:  75 tablet    Refill:  3     Follow-up: Return in about 4 months (around 07/04/2018).  Theresa Calico, MD

## 2018-03-05 NOTE — Patient Instructions (Signed)

## 2018-03-08 NOTE — Addendum Note (Signed)
Addended by: Aviva Signs M on: 03/08/2018 10:40 AM   Modules accepted: Orders

## 2018-03-13 ENCOUNTER — Ambulatory Visit (HOSPITAL_COMMUNITY)
Admission: RE | Admit: 2018-03-13 | Discharge: 2018-03-13 | Disposition: A | Discharge status: Home / Self Care | Payer: PPO | Source: Ambulatory Visit | Attending: Internal Medicine | Admitting: Internal Medicine

## 2018-03-13 ENCOUNTER — Encounter (HOSPITAL_COMMUNITY): Payer: Self-pay

## 2018-03-13 DIAGNOSIS — D509 Iron deficiency anemia, unspecified: Secondary | ICD-10-CM | POA: Insufficient documentation

## 2018-03-13 MED ORDER — SODIUM CHLORIDE 0.9 % IV SOLN
750.0000 mg | Freq: Once | INTRAVENOUS | Status: DC
  Filled 2018-03-13: qty 15

## 2018-03-20 ENCOUNTER — Ambulatory Visit (HOSPITAL_COMMUNITY)
Admission: RE | Admit: 2018-03-20 | Discharge: 2018-03-20 | Disposition: A | Discharge status: Home / Self Care | Payer: PPO | Source: Ambulatory Visit | Attending: Internal Medicine | Admitting: Internal Medicine

## 2018-03-20 DIAGNOSIS — D509 Iron deficiency anemia, unspecified: Secondary | ICD-10-CM | POA: Diagnosis not present

## 2018-03-20 MED ORDER — SODIUM CHLORIDE 0.9 % IV SOLN
750.0000 mg | Freq: Once | INTRAVENOUS | Status: AC
  Administered 2018-03-20: 750 mg via INTRAVENOUS
  Filled 2018-03-20: qty 15

## 2018-03-20 MED ORDER — SODIUM CHLORIDE 0.9 % IV SOLN: INTRAVENOUS | Status: DC | PRN

## 2018-03-20 NOTE — Discharge Instructions (Signed)
Ferric carboxymaltose injection What is this medicine? FERRIC CARBOXYMALTOSE (ferr-ik car-box-ee-mol-toes) is an iron complex. Iron is used to make healthy red blood cells, which carry oxygen and nutrients throughout the body. This medicine is used to treat anemia in people with chronic kidney disease or people who cannot take iron by mouth. This medicine may be used for other purposes; ask your health care provider or pharmacist if you have questions. COMMON BRAND NAME(S): Injectafer What should I tell my health care provider before I take this medicine? They need to know if you have any of these conditions: -anemia not caused by low iron levels -high levels of iron in the blood -liver disease -an unusual or allergic reaction to iron, other medicines, foods, dyes, or preservatives -pregnant or trying to get pregnant -breast-feeding How should I use this medicine? This medicine is for infusion into a vein. It is given by a health care professional in a hospital or clinic setting. Talk to your pediatrician regarding the use of this medicine in children. Special care may be needed. Overdosage: If you think you have taken too much of this medicine contact a poison control center or emergency room at once. NOTE: This medicine is only for you. Do not share this medicine with others. What if I miss a dose? It is important not to miss your dose. Call your doctor or health care professional if you are unable to keep an appointment. What may interact with this medicine? Do not take this medicine with any of the following medications: -deferoxamine -dimercaprol -other iron products This medicine may also interact with the following medications: -chloramphenicol -deferasirox This list may not describe all possible interactions. Give your health care provider a list of all the medicines, herbs, non-prescription drugs, or dietary supplements you use. Also tell them if you smoke, drink alcohol, or use  illegal drugs. Some items may interact with your medicine. What should I watch for while using this medicine? Visit your doctor or health care professional regularly. Tell your doctor if your symptoms do not start to get better or if they get worse. You may need blood work done while you are taking this medicine. You may need to follow a special diet. Talk to your doctor. Foods that contain iron include: whole grains/cereals, dried fruits, beans, or peas, leafy green vegetables, and organ meats (liver, kidney). What side effects may I notice from receiving this medicine? Side effects that you should report to your doctor or health care professional as soon as possible: -allergic reactions like skin rash, itching or hives, swelling of the face, lips, or tongue -breathing problems -changes in blood pressure -feeling faint or lightheaded, falls -flushing, sweating, or hot feelings Side effects that usually do not require medical attention (report to your doctor or health care professional if they continue or are bothersome): -changes in taste -constipation -dizziness -headache -nausea -pain, redness, or irritation at site where injected -vomiting This list may not describe all possible side effects. Call your doctor for medical advice about side effects. You may report side effects to FDA at 1-800-FDA-1088. Where should I keep my medicine? This drug is given in a hospital or clinic and will not be stored at home. NOTE: This sheet is a summary. It may not cover all possible information. If you have questions about this medicine, talk to your doctor, pharmacist, or health care provider.  2018 Elsevier/Gold Standard (2015-05-20 11:20:47)  

## 2018-03-20 NOTE — Progress Notes (Signed)
[facility]  Provider: Dr. [name]  Procedure: IV Injectafer infusion  Note:  Patient received IV  Injectafer infusion. Tolerated the procedure, no adverse noted. Vital signs obtained 30 mins after infusion, stable. Discharge instructions provided. Patient alert , oriented, ambulatory at discharge.

## 2018-03-21 LAB — COLOGUARD: Cologuard: POSITIVE

## 2018-03-25 NOTE — Telephone Encounter (Signed)
error 

## 2018-03-27 ENCOUNTER — Ambulatory Visit (HOSPITAL_COMMUNITY)
Admission: RE | Admit: 2018-03-27 | Discharge: 2018-03-27 | Disposition: A | Discharge status: Home / Self Care | Payer: PPO | Source: Ambulatory Visit | Attending: Internal Medicine | Admitting: Internal Medicine

## 2018-03-27 ENCOUNTER — Other Ambulatory Visit: Payer: Self-pay

## 2018-03-27 DIAGNOSIS — E1151 Type 2 diabetes mellitus with diabetic peripheral angiopathy without gangrene: Secondary | ICD-10-CM

## 2018-03-27 DIAGNOSIS — D509 Iron deficiency anemia, unspecified: Secondary | ICD-10-CM | POA: Diagnosis not present

## 2018-03-27 MED ORDER — SODIUM CHLORIDE 0.9 % IV SOLN
INTRAVENOUS | Status: DC | PRN
  Administered 2018-03-27: 250 mL via INTRAVENOUS

## 2018-03-27 MED ORDER — SODIUM CHLORIDE 0.9 % IV SOLN
750.0000 mg | Freq: Once | INTRAVENOUS | Status: AC
  Administered 2018-03-27: 750 mg via INTRAVENOUS
  Filled 2018-03-27: qty 15

## 2018-03-27 MED ORDER — INSULIN DETEMIR 100 UNIT/ML FLEXPEN: 80.0000 [IU] | PEN_INJECTOR | Freq: Every day | SUBCUTANEOUS | 3 refills | Status: DC

## 2018-03-27 NOTE — Discharge Instructions (Signed)
Ferric carboxymaltose injection What is this medicine? FERRIC CARBOXYMALTOSE (ferr-ik car-box-ee-mol-toes) is an iron complex. Iron is used to make healthy red blood cells, which carry oxygen and nutrients throughout the body. This medicine is used to treat anemia in people with chronic kidney disease or people who cannot take iron by mouth. This medicine may be used for other purposes; ask your health care provider or pharmacist if you have questions. COMMON BRAND NAME(S): Injectafer What should I tell my health care provider before I take this medicine? They need to know if you have any of these conditions: -anemia not caused by low iron levels -high levels of iron in the blood -liver disease -an unusual or allergic reaction to iron, other medicines, foods, dyes, or preservatives -pregnant or trying to get pregnant -breast-feeding How should I use this medicine? This medicine is for infusion into a vein. It is given by a health care professional in a hospital or clinic setting. Talk to your pediatrician regarding the use of this medicine in children. Special care may be needed. Overdosage: If you think you have taken too much of this medicine contact a poison control center or emergency room at once. NOTE: This medicine is only for you. Do not share this medicine with others. What if I miss a dose? It is important not to miss your dose. Call your doctor or health care professional if you are unable to keep an appointment. What may interact with this medicine? Do not take this medicine with any of the following medications: -deferoxamine -dimercaprol -other iron products This medicine may also interact with the following medications: -chloramphenicol -deferasirox This list may not describe all possible interactions. Give your health care provider a list of all the medicines, herbs, non-prescription drugs, or dietary supplements you use. Also tell them if you smoke, drink alcohol, or use  illegal drugs. Some items may interact with your medicine. What should I watch for while using this medicine? Visit your doctor or health care professional regularly. Tell your doctor if your symptoms do not start to get better or if they get worse. You may need blood work done while you are taking this medicine. You may need to follow a special diet. Talk to your doctor. Foods that contain iron include: whole grains/cereals, dried fruits, beans, or peas, leafy green vegetables, and organ meats (liver, kidney). What side effects may I notice from receiving this medicine? Side effects that you should report to your doctor or health care professional as soon as possible: -allergic reactions like skin rash, itching or hives, swelling of the face, lips, or tongue -breathing problems -changes in blood pressure -feeling faint or lightheaded, falls -flushing, sweating, or hot feelings Side effects that usually do not require medical attention (report to your doctor or health care professional if they continue or are bothersome): -changes in taste -constipation -dizziness -headache -nausea -pain, redness, or irritation at site where injected -vomiting This list may not describe all possible side effects. Call your doctor for medical advice about side effects. You may report side effects to FDA at 1-800-FDA-1088. Where should I keep my medicine? This drug is given in a hospital or clinic and will not be stored at home. NOTE: This sheet is a summary. It may not cover all possible information. If you have questions about this medicine, talk to your doctor, pharmacist, or health care provider.  2018 Elsevier/Gold Standard (2015-05-20 11:20:47)  

## 2018-03-27 NOTE — Progress Notes (Signed)
Patient received Injectafer via PIV as ordered . Observed for at least 30 minutes post infusion.Tolerated well, vitals stable, discharge instructions given, verbalized understanding. Patient alert, oriented and ambulatory at the time of discharge.  

## 2018-04-03 DIAGNOSIS — Z1212 Encounter for screening for malignant neoplasm of rectum: Secondary | ICD-10-CM | POA: Diagnosis not present

## 2018-04-03 DIAGNOSIS — Z1211 Encounter for screening for malignant neoplasm of colon: Secondary | ICD-10-CM | POA: Diagnosis not present

## 2018-04-12 ENCOUNTER — Other Ambulatory Visit: Payer: Self-pay | Admitting: Pharmacist

## 2018-04-12 NOTE — Patient Outreach (Signed)
[facility]) Care Management  04/12/2018  Theresa Harrington 05-04-1955 [id number]   Contact patient's PCP regarding:  A) Whether patient's Novolog can be switched to Humalog to allow patient to apply for patient assistance program through [employer]) Whether patient should restart Trulicity, given that she has been off of it for a couple of months due to cost, and that, if yes, we can assist her with  completing the patient assistance program paperwork through the manufacturer.   C) Whether patient should continue Zetia, if so, we can assist with completing this patient assistance program paperwork as well.  D) If/when patient should restart taking ferrous sulfate  Leave a message with [name] in the office and request a call back.  Harlow Asa, PharmD, [place] [facility] [phone]

## 2018-04-12 NOTE — Patient Outreach (Signed)
[facility]) Care Management  [place]   04/12/2018  Madeliene Harrington Jan 30, 1956 [id number]  Incoming call from Theresa Harrington in response to the [facility] Medication Adherence Campaign. Speak with patient. HIPAA identifiers verified and verbal consent received.  Medication Adherence  Ms. Ford reports that she is taking her metformin 1000 mg twice daily as directed. Reports that she also injects Levemir insulin - 80 units each evening and Novolog insulin - 16-20 units with each meal depending on the size of the meal, as directed by her doctor. Patient counseled on importance of medication adherence/blood sugar control. Reports that her fasting blood sugar this morning was 234 mg/dL. Reports that it has been up and down and that she has had difficulty with control.  Reports that she has NOT been taking the Trulicity 2.77 mg weekly due to concern about the cost, as she has entered the coverage gap of her Part D plan. Reports that she has 3 pens of the Trulicity remaining in her fridge, but that she stopped using it "a couple of months ago" she did not see the point as she would not be able to afford the refill. Denies having informed PCP. Note that Trulicity originally prescribed by patient's endocrinologist and per chart, patient overdue for follow up visit to endocrinologist. However, patient reports that PCP has been managing her diabetes medications, as she has not returned to her endocrinologist due to cost concerns.   Medication Management/Medication Assistance  Perform medication review with patient and update medication list in chart updated accordingly. Reports that her blood sugar has been difficult to manage. Reports feeling frustrated by difficulty with affording her medications, particularly since entering the coverage gap of her Part D plan. Reports needing assistance with cost of Trulicity, Novolog, Levemir, Xarelto and Zetia. Counsel patient about importance of  communicating with PCP before stopping or starting medications, such as Trulicity. Let patient know that I will reach out to her PCP regarding whether patient should restart Trulicity, given that she has been off of it for a couple of months, and that, if yes, we can assist her with completing the patient assistance program paperwork.   Patient reports that Zetia was removed from her medication list by her PCP because she informed him that she was no longer able to afford it, but that she has continued to take it as she had some remaining. Let patient know that I will follow up with PCP regarding whether patient should continue this as well and, if so, we can assist with completing this patient assistance program paperwork as well.  Reports that she currently uses [employer] to complete her patient assistance paperwork for the Levemir patient assistance program once in the coverage gap, paying $50/month for this program. Review requirements of this program with patient as well and let her know that we can also help her with this assistance program paperwork once/if she meets the out of pocket requirement if not eligible for extra help.  Reports that she has been receiving samples of Xarelto from her provider since entering the coverage gap. Encourage patient to continue to request samples and on importance of adherence.  Patient currently taking Novolog, but reports that she has taken Humalog in the past. Let patient know that I will reach out to PCP about whether this prescription can be changed to Humalog to allow her to receive assistance.    Explain to patient the services of [facility]. Patient denies need for nursing or social  work referral at this time, but states that she might be interested in the future.   Objective: Lab Results  Component Value Date   CREATININE 0.79 03/05/2018   CREATININE 0.84 10/31/2017   CREATININE 0.76 04/04/2017    Lab Results  Component Value Date    HGBA1C 9.1 (A) 03/05/2018    Lipid Panel     Component Value Date/Time   CHOL 148 03/05/2018 1029   TRIG 166.0 (H) 03/05/2018 1029   HDL 51.50 03/05/2018 1029   CHOLHDL 3 03/05/2018 1029   VLDL 33.2 03/05/2018 1029   LDLCALC 63 03/05/2018 1029   LDLDIRECT 160.2 03/01/2010 1001    BP Readings from Last 3 Encounters:  03/27/18 (!) 155/63  03/20/18 (!) 143/64  03/05/18 136/66    Allergies  Allergen Reactions  . Amlodipine Swelling  . Atorvastatin Other (See Comments)    Reaction:  Muscle aches   . Ampicillin Nausea And Vomiting and Other (See Comments)  . Codeine Nausea And Vomiting    Ask patient  . Lisinopril Cough       . Penicillins Nausea And Vomiting and Other (See Comments)  . Januvia [Sitagliptin] Nausea And Vomiting    Outpatient Encounter Medications as of 04/12/2018  Medication Sig Note  . diltiazem (CARTIA XT) 120 MG 24 hr capsule Take 1 capsule (120 mg total) by mouth daily.   [name] ezetimibe (ZETIA) 10 MG tablet Take 10 mg by mouth daily.   . insulin aspart (NOVOLOG FLEXPEN) 100 UNIT/ML FlexPen Inject 20-24 Units into the skin 3 (three) times daily with meals. 04/12/2018: Injecting 16-20 units with meals three times daily  . Insulin Detemir (LEVEMIR FLEXTOUCH) 100 UNIT/ML Pen Inject 80 Units into the skin daily at 10 pm. DX E11.51, E11.65   . latanoprost (XALATAN) 0.005 % ophthalmic solution Place 1 drop into both eyes at bedtime.    [name] losartan-hydrochlorothiazide (HYZAAR) 100-12.5 MG tablet TAKE 1 TABLET BY MOUTH ONCE DAILY   . metFORMIN (GLUCOPHAGE) 1000 MG tablet TAKE 1 TABLET BY MOUTH TWICE DAILY WITH MEALS   . pravastatin (PRAVACHOL) 40 MG tablet Take 1 tablet (40 mg total) by mouth daily.   . rivaroxaban (XARELTO) 20 MG TABS tablet Take 1 tablet (20 mg total) by mouth daily with supper.   . thiamine (VITAMIN B-1) 100 MG tablet Take 1 tablet (100 mg total) by mouth daily.   . ferrous sulfate 325 (65 FE) MG tablet Take 1 tablet (325 mg total) by mouth daily  with breakfast. (Patient not taking: Reported on 04/12/2018)   . Insulin Pen Needle (PEN NEEDLES) 32G X 4 MM MISC Inject 1 pen as directed 4 (four) times daily. Use to inject levemir or novolog as directed.   . magnesium oxide (MAG-OX) 400 (241.3 Mg) MG tablet TAKE 1 TABLET BY MOUTH ONCE DAILY (Patient not taking: Reported on 04/12/2018)   . TRULICITY 3.15 QM/0.8QP SOPN PLEASE START INJECTING 0.75MG UNDER THE SKIN WEEKLY (Patient not taking: Reported on 04/12/2018)    No facility-administered encounter medications on file as of 04/12/2018.      Assessment:  Drugs sorted by system:  Cardiovascular: Cartia XT, Zetia, losartan-hydrochlorothiazide, pravastatin, Xarelto  Endocrine: Novolog, Levemir, metformin, Trulicity (not currently taking)  Vitamins/Minerals/Supplements: ferrous sulfate (not currently taking), magnesium oxide (not currently taking), thiamine  Miscellaneous: Xalatan eye drops  Medication Review Findings:   . Patient denies taking Dexilant. Reports acid issue resolved- removed from medication list . Novolog listed twice on list- duplicate removed from list . Reports no longer  taking tramadol for pain - removed from medication list . Patient due for refill of magnesium oxide and pravastatin  . Patient states instructed to hold ferrous sulfate following infusion of Injectafer on 11/20 and 11/27 per chart. Need for clarification from provider about  if/when to resume.  Medication Assistance Findings:  Extra Help:   _0  Already receiving Full Extra Help  _1  Already receiving Partial Extra Help  _2  Eligible based on reported income and assets  _3  Not Eligible based on reported income and assets  Patient Assistance Programs: 1) Humalog (if switch approved by PCP) made by [employer] o Income requirement met: _4  Yes _5  No _6  Unknown o Out-of-pocket prescription expenditure met:   _7  Yes _8  No  _9  Unknown  <DZHGDJMEQASTMHDQ>_2<\/IWLNLGXQJJHERDEY>_81  Not applicable - Note that this program allows for waiving of  expenditure requirement with letter - *Program allows patients with partial extra help        2)  Trulicity (if to be continued per PCP) made by [employer] o Income requirement met: _11  Yes _12  No  _13  Unknown o Out-of-pocket prescription expenditure met:  _14  Yes _15  No   _16  Unknown <KGYJEHUDJSHFWYOV>_7<\/CHYIFOYDXAJOINOM>_76  Not applicable - Note that this program allows for waiving of expenditure requirement with letter - *Program allows patients with partial extra help        3) Zetia (if to be continued per PCP) made by [employer] o Income requirement met: _18  Yes _19  No  _20  Unknown o Out-of-pocket prescription expenditure met:  _21  Yes _22  No   _23  Unknown <HMCNOBSJGGEZMOQH>_4<\/TMLYYTKPTWSFKCLE>_75  Not applicable - *Program allows patients with partial extra help   Additional medication assistance options reviewed with patient as warranted:  -Patient Access Network (PAN) Foundation program for Atrial Fibrillation - currently closed   Plan:  1) Patient to call me back this afternoon for assistance with applying for Extra Help through [employer].  2) Will contact patient's PCP regarding:  A) Whether patient's Novolog can be switched to Humalog to allow patient to apply for patient assistance program through [place]) Whether patient should restart Trulicity, given that she has been off of it for a couple of months due to cost, and that, if yes, we can assist her with  completing the patient assistance program paperwork through the manufacturer.   C) Whether patient should continue Zetia, if so, we can assist with completing this patient assistance program paperwork as well.  D) If/when patient should restart taking ferrous sulfate  3) Will follow up with patient again once I have heard back from her PCP office. At that time, will route patient assistance letter to [place] technician who will coordinate patient assistance program application process. Will also follow up with patient regarding potential referral to [place].  4) Patient to pick up refills  of magnesium oxide and pravastatin.  Harlow Asa, PharmD, [place] [facility] [phone]

## 2018-04-15 ENCOUNTER — Telehealth: Payer: Self-pay

## 2018-04-15 DIAGNOSIS — D508 Other iron deficiency anemias: Secondary | ICD-10-CM

## 2018-04-15 NOTE — Telephone Encounter (Signed)
Could pt use Humalong instead of Novolog? Pt could receive better assistance with the Humalog. Please advise

## 2018-04-15 NOTE — Telephone Encounter (Signed)
Copied from [place] [phone]. Topic: General - Other >> Apr 12, 2018 11:59 AM Janace Aris A wrote: Reason for CRM: Benjamine Mola called in from patient outreach. She says they can potentially help the patient with assistance with her medication payment, but she would first like a call back regarding some meds.   She would like Dr. [name] to transfer the patient from [place]  to Humalog, so the meds can be eligible for patient assistance. Also the pt has been off of the medication trulicity for a couple months due to not being able to afford the RX, but if Dr. [name] would prefer pt to be on it she can help with patient assistance for this medication.  The medication Zetia she says the patient has not been taking this for a couple weeks due to not being able to afford monthly, but if she can receive assistance with payment would the Dr. Dixie Dials her to resume meds?  Pt also stated she was told to stop taking the Ferrous Sulfate, Does the Dr. Dixie Dials Patient to resume medication also?

## 2018-04-15 NOTE — Telephone Encounter (Signed)
Yes, it is the same dose.  Theresa Harrington

## 2018-04-16 NOTE — Telephone Encounter (Signed)
Message left with [facility] yesterday informing of same.

## 2018-04-17 ENCOUNTER — Other Ambulatory Visit: Payer: Self-pay | Admitting: Pharmacist

## 2018-04-17 ENCOUNTER — Encounter: Payer: Self-pay | Admitting: Internal Medicine

## 2018-04-17 DIAGNOSIS — R195 Other fecal abnormalities: Secondary | ICD-10-CM

## 2018-04-17 NOTE — Progress Notes (Signed)
Cologuard result was positive. Patient has been informed and the referral to GI has been entered.

## 2018-04-17 NOTE — Patient Outreach (Signed)
[facility]) Care Management  04/17/2018  Theresa Harrington [birth year] [id number]   Receive a voicemail from [place] with Dr. [name]' office requesting a call back. Return call to [facility].  Stefannie states that per Dr. [name], it is fine to switch patient from Novolog to Humalog. Also states that Dr. [name] would like for the patient to continue to take Zetia and patient to resume Trulicity and ferrous sulfate. Stefannie reports that she has already called [name] to let her know about these directions from Dr. [name].   Adelina Mings asks that when we assist patient with patient assistance paperwork, we send her an InBasket message to let her know that we have faxed.  PLAN  Will call to follow up with patient regarding the extra help application and patient assistance programs paperwork.  Harlow Asa, PharmD, [place] [facility] [phone]

## 2018-04-17 NOTE — Patient Outreach (Signed)
[place] [facility]) Care Management  04/17/2018  Theresa Harrington [birth year] [id number]   Call to follow up with [name] regarding medication management and medication assistance. Left a HIPAA compliant message on the patient's voicemail.  Receive a call back from [name]. Patient confirms that she spoke with Stefannie from her PCP office regarding medication reconciliation. Patient denies having completed the extra help application. Patient reports that she is driving right now and asks if she can call me back this afternoon, as soon as she gets home  PLAN  Will await patient's call back to assist her with completing the extra help application through the [employer].  Harlow Asa, PharmD, [place] [facility] [phone]

## 2018-04-17 NOTE — Patient Outreach (Signed)
[facility]) Care Management  04/17/2018  Theresa Harrington November 09, 1955 [id number]   Receive a call back from [name] regarding the Extra Help application. HIPAA identifiers verified.  Verbally walk [name] through how to access the Extra Help application online as she sits at her computer. Patient states that she will complete this application today and call me with any questions. Advise patient that she should expect a response in about 2-4 weeks via mail. Advise patient to retain this response letter, whether approved or denied.   Based on patient's reported resources and income, patient may qualify for partial extra help. Note that both the [employer] and [employer] patient assistance programs allow patients with partial extra help to apply.  PLAN   1) Patient to complete the Extra Help application online today.  2) I will route patient assistance letter to [place] technician who will coordinate patient assistance program application process for Humalog and Trulicity through [place] through [employer].  Harlow Asa, PharmD, [place] [facility] [phone]

## 2018-04-18 ENCOUNTER — Other Ambulatory Visit: Payer: Self-pay | Admitting: Pharmacy Technician

## 2018-04-18 MED ORDER — DULAGLUTIDE 0.75 MG/0.5ML ~~LOC~~ SOAJ: SUBCUTANEOUS | 3 refills | Status: DC

## 2018-04-18 MED ORDER — FERROUS SULFATE 325 (65 FE) MG PO TABS: 325.0000 mg | ORAL_TABLET | Freq: Every day | ORAL | 1 refills | Status: DC

## 2018-04-18 MED ORDER — EZETIMIBE 10 MG PO TABS: 10.0000 mg | ORAL_TABLET | Freq: Every day | ORAL | 3 refills | Status: DC

## 2018-04-18 MED ORDER — INSULIN LISPRO 100 UNIT/ML ~~LOC~~ SOLN: 20.0000 [IU] | Freq: Three times a day (TID) | SUBCUTANEOUS | 1 refills | Status: DC

## 2018-04-18 NOTE — Patient Outreach (Signed)
[facility]) Care Management  04/18/2018  Rielyn Krupinski Jul 20, 1955 [id number]    Received patient assistance referral from [place] for [employer] with [employer] and [place] with [employer].  Prepared patient portion to be mailed. Faxed provider portion of Lilly application to [employer] and [employer] the [employer].  Will followup with patient in 10-14 business days to confirm receipt of application.  Dominik Yordy P. Saba Gomm, [place] [facility] [phone]

## 2018-04-18 NOTE — Telephone Encounter (Signed)
Spoke to [employer] yesterday. The pharmacy is getting the information paper work together and will send over once the patient portion of application is completed.

## 2018-04-25 ENCOUNTER — Other Ambulatory Visit: Payer: Self-pay | Admitting: Internal Medicine

## 2018-04-30 ENCOUNTER — Other Ambulatory Visit: Payer: Self-pay | Admitting: Pharmacy Technician

## 2018-04-30 NOTE — Patient Outreach (Signed)
[facility]) Care Management  04/30/2018  Theresa Harrington August 10, 1955 [id number]    Successful outreach call placed to patient in regards to her patient assistance application s for Zetia with [employer] and Humalog & Trulicity with [employer].  Spoke to patient, HIPPA identifiers verified.   Patient states she has received the applications and hopes to work on them in the next day or two.  Will followup with 2nd call in 10-14 business days if application has not been received back.  Falicia Lizotte P. Rhian Asebedo, [place] [facility] [phone]

## 2018-05-15 ENCOUNTER — Ambulatory Visit (INDEPENDENT_AMBULATORY_CARE_PROVIDER_SITE_OTHER): Payer: PPO | Admitting: Emergency Medicine

## 2018-05-15 ENCOUNTER — Ambulatory Visit: Payer: PPO | Admitting: Nurse Practitioner

## 2018-05-15 DIAGNOSIS — E538 Deficiency of other specified B group vitamins: Secondary | ICD-10-CM

## 2018-05-15 MED ORDER — CYANOCOBALAMIN 1000 MCG/ML IJ SOLN
1000.0000 ug | Freq: Once | INTRAMUSCULAR | Status: AC
  Administered 2018-05-15: 1000 ug via INTRAMUSCULAR

## 2018-05-15 NOTE — Progress Notes (Signed)
I have reviewed and agree.

## 2018-05-16 ENCOUNTER — Other Ambulatory Visit: Payer: Self-pay | Admitting: Pharmacy Technician

## 2018-05-16 NOTE — Patient Outreach (Signed)
[place] [facility]) Care Management  05/16/2018  Theresa Harrington Feb 14, 1956 [id number]  Unsuccessful outreach call placed to patient in regards to [employer] and [employer] for [employer] and Trulicity respectively.  Unfortunately patient did not answer the phone, HIPAA complaint voicemail left.  Called patient to inquire if she had mailed back the application as she stated in our December conversation.  Will followup with patient in 5-7 business days if call is not returned.  Breena Bevacqua P. Liann Spaeth, [place] [facility] [phone]

## 2018-05-20 ENCOUNTER — Ambulatory Visit: Payer: PPO | Admitting: Physician Assistant

## 2018-05-22 ENCOUNTER — Other Ambulatory Visit: Payer: Self-pay | Admitting: Pharmacy Technician

## 2018-05-22 NOTE — Patient Outreach (Signed)
[place] [facility]) Care Management  05/22/2018  Ilamae Geng 1955/07/06 [id number]   Second unsuccessful outreach call placed to patient in regards to [employer] patient assistance applications for Zetia and Humalog & Trulicity respectively.  Someone in the home answered the phone and said she was not available at this time. A HIPAA complaint voicemail was left along with my name and number asking for a return call. Called to inquire if patient had mailed back the patient assistance forms that were mailed out on 12/19.  Will followup with 3rd call attempt in 3-5 business days if call is not returned.  Arvie Bartholomew P. Roshanna Cimino, [place] [facility] [phone]

## 2018-05-23 ENCOUNTER — Ambulatory Visit (INDEPENDENT_AMBULATORY_CARE_PROVIDER_SITE_OTHER): Payer: PPO | Admitting: Physician Assistant

## 2018-05-23 ENCOUNTER — Telehealth: Payer: Self-pay | Admitting: *Deleted

## 2018-05-23 ENCOUNTER — Encounter: Payer: Self-pay | Admitting: Physician Assistant

## 2018-05-23 ENCOUNTER — Other Ambulatory Visit: Payer: Self-pay | Admitting: Internal Medicine

## 2018-05-23 VITALS — BP 142/68 | HR 70 | Ht 65.5 in | Wt 186.8 lb

## 2018-05-23 DIAGNOSIS — R195 Other fecal abnormalities: Secondary | ICD-10-CM

## 2018-05-23 DIAGNOSIS — Z7901 Long term (current) use of anticoagulants: Secondary | ICD-10-CM

## 2018-05-23 DIAGNOSIS — Z8639 Personal history of other endocrine, nutritional and metabolic disease: Secondary | ICD-10-CM | POA: Diagnosis not present

## 2018-05-23 NOTE — Progress Notes (Signed)
Thank you for sending this case to me. I have reviewed the entire note, and the outlined plan seems appropriate.   Henry Danis, MD  

## 2018-05-23 NOTE — Progress Notes (Signed)
Subjective:    Patient ID: Theresa Harrington, female    DOB: June 23, 1955, 63 y.o.   MRN: [id number]  HPI Theresa Harrington is a pleasant 63 year old white female, new to GI today referred by Dr. Scarlette Calico after positive Cologuard. Patient has not had any prior colon screening.  She relates family history of colon cancer in an aunt.  She denies any current issues with abdominal discomfort, changes in bowel habits melena or hematochezia..  Review of records show that she also did a Cologuard in 2016 which was negative. She relates history of iron deficiency and said that she had iron infusions within the past year.  She also believes she had been anemic.  She is currently on oral iron Slow Fe twice daily. Patient had a remote lap band done for obesity, when asked if she knew whether the port was inflated she says she doubted it as she had not seen her surgeon in several years. Reviewing records she was anemic in 2016 with hemoglobin of 9.7 more recently November 2019 hemoglobin 12.7 hematocrit of 37.9 MCV of 83, ferritin very low normal at 11.9, serum iron 28, transferrin 268 and iron saturation of 7.5. Other medical problems include history of atrial fibrillation for which she is on chronic Xarelto, currently followed by Dr. Marlou Porch.  She is also had a femoropopliteal bypass in February 2016, has adult onset diabetes mellitus insulin-dependent, and hypertension.  Review of Systems Pertinent positive and negative review of systems were noted in the above HPI section.  All other review of systems was otherwise negative. Outpatient Encounter Medications as of 05/23/2018  Medication Sig  . diltiazem (CARTIA XT) 120 MG 24 hr capsule Take 1 capsule (120 mg total) by mouth daily.  [name] ezetimibe (ZETIA) 10 MG tablet Take 1 tablet (10 mg total) by mouth daily.  . ferrous sulfate 325 (65 FE) MG tablet Take 1 tablet (325 mg total) by mouth daily with breakfast.  . Insulin Detemir (LEVEMIR FLEXTOUCH) 100 UNIT/ML Pen Inject 80  Units into the skin daily at 10 pm. DX E11.51, E11.65  . insulin lispro (HUMALOG) 100 UNIT/ML injection Inject 0.2-0.24 mLs (20-24 Units total) into the skin 3 (three) times daily before meals.  . Insulin Pen Needle (PEN NEEDLES) 32G X 4 MM MISC Inject 1 pen as directed 4 (four) times daily. Use to inject levemir or novolog as directed.  . latanoprost (XALATAN) 0.005 % ophthalmic solution Place 1 drop into both eyes at bedtime.   [name] losartan-hydrochlorothiazide (HYZAAR) 100-12.5 MG tablet TAKE 1 TABLET BY MOUTH ONCE DAILY  . magnesium oxide (MAG-OX) 400 (241.3 Mg) MG tablet TAKE 1 TABLET BY MOUTH ONCE DAILY  . metFORMIN (GLUCOPHAGE) 1000 MG tablet TAKE 1 TABLET BY MOUTH TWICE DAILY WITH MEALS  . pravastatin (PRAVACHOL) 40 MG tablet Take 1 tablet (40 mg total) by mouth daily.  . rivaroxaban (XARELTO) 20 MG TABS tablet Take 1 tablet (20 mg total) by mouth daily with supper.  . thiamine (VITAMIN B-1) 100 MG tablet Take 1 tablet (100 mg total) by mouth daily.  . TRULICITY 2.83 TD/1.7OH SOPN PLEASE START INJECTING 0.75MG  UNDER THE SKIN WEEKLY   No facility-administered encounter medications on file as of 05/23/2018.    Allergies  Allergen Reactions  . Amlodipine Swelling  . Atorvastatin Other (See Comments)    Reaction:  Muscle aches   . Ampicillin Nausea And Vomiting and Other (See Comments)  . Codeine Nausea And Vomiting    Ask patient  . Lisinopril Cough       .  Penicillins Nausea And Vomiting and Other (See Comments)  . Januvia [Sitagliptin] Nausea And Vomiting   Patient Active Problem List   Diagnosis Date Noted  . Thiamine deficiency 11/05/2017  . Atrial fibrillation with RVR ([place]) 11/08/2016  . Chronic idiopathic constipation 06/05/2016  . Morbid obesity due to excess calories ([place]) 08/04/2015  . Routine general medical examination at a health care facility 01/28/2015  . Visit for screening mammogram 01/27/2015  . Screening for cervical cancer 01/27/2015  . Kidney stone on right  side 01/27/2015  . Gallstones 04/28/2014  . Atherosclerosis of native arteries of the extremities with ulceration ([place]) 11/14/2013  . Anemia, iron deficiency 10/28/2013  . Vitamin B12 deficiency neuropathy ([place]) 10/28/2013  . 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 10/09/2013  . GERD (gastroesophageal reflux disease) 08/06/2012  . Allergic rhinitis, cause unspecified 07/04/2012  . Low back pain radiating to both legs 07/04/2012  . GOITER, MULTINODULAR 11/04/2008  . Uncontrolled type 2 diabetes mellitus with peripheral artery disease ([place]) 08/05/2008  . Hyperlipidemia with target LDL less than 100 08/05/2008  . Essential hypertension, benign 08/05/2008   Social History   Socioeconomic History  . Marital status: Single    Spouse name: Not on file  . Number of children: Not on file  . Years of education: Not on file  . Highest education level: Not on file  Occupational History  . Occupation: Quarry manager: [employer]  . Financial resource strain: Not on file  . Food insecurity:    Worry: Not on file    Inability: Not on file  . Transportation needs:    Medical: Not on file    Non-medical: Not on file  Tobacco Use  . Smoking status: Former Smoker    Packs/day: 1.00    Years: 28.00    Pack years: 28.00    Types: Cigarettes    Last attempt to quit: 09/05/2001    Years since quitting: 16.7  . Smokeless tobacco: Never Used  Substance and Sexual Activity  . Alcohol use: No    Alcohol/week: 0.0 standard drinks  . Drug use: No  . Sexual activity: Yes  Lifestyle  . Physical activity:    Days per week: Not on file    Minutes per session: Not on file  . Stress: Not on file  Relationships  . Social connections:    Talks on phone: Not on file    Gets together: Not on file    Attends religious service: Not on file    Active member of club or organization: Not on file    Attends meetings of clubs or organizations: Not on file     Relationship status: Not on file  . Intimate partner violence:    Fear of current or ex partner: Not on file    Emotionally abused: Not on file    Physically abused: Not on file    Forced sexual activity: Not on file  Other Topics Concern  . Not on file  Social History Narrative   Regular exercise- yes    Ms. Rabon's family history includes Arthritis in an other family member; Cancer in her brother and another family member; Colon cancer in her maternal aunt; Deep vein thrombosis in her father and sister; Diabetes in her brother, brother, brother, father, and sister; Heart disease in her brother, father, and sister; Hyperlipidemia in her brother, father, mother, and sister; Hypertension in an other family member; Kidney disease in her mother;  Other in her mother; Stroke in an other family member.      Objective:    Vitals:   05/23/18 0956  BP: (!) 142/68  Pulse: 70    Physical Exam; well-developed white female in no acute distress, pleasant, height 5 foot 5, weight 186, BMI 30.6.  HEENT ;nontraumatic normocephalic EOMI PERRLA sclera anicteric oral mucosa moist.  Cardiovascular; regular rate and rhythm with S1-S2, Pulmonary ;clear bilaterally, Abdomen; soft, bowel sounds are present, palpable port in the epigastrium from remote LAP-BAND, she is nontender there is no palpable mass or hepatosplenomegaly, Rectal; exam not done, Extremities; no clubbing cyanosis or edema skin warm and dry, Neuropsych ;alert and oriented, grossly nonfocal mood and affect appropriate       Assessment & Plan:   #67 63 year old female referred after positive Cologuard in November 2019.  Patient has no active GI complaints, not noted any melena or hematochezia.  No prior colon evaluation. Rule out occult colon cancer, or adenomatous polyps  #2 history of iron deficiency, and intermittent anemia.  Most recent labs November 2019 with normal hemoglobin low iron stores.  She has had iron infusions.  #3 status  post remote lap band for obesity #4 adult onset diabetes mellitus-insulin-dependent #5.  Atrial fibrillation #6.  Chronic anticoagulation-on Xarelto #7.  Peripheral vascular disease status post femoropopliteal bypass 2016. #8 family history of colon cancer in patient's aunt   Plan ; With positive Cologuard and history of iron deficiency patient will be scheduled for colonoscopy and upper endoscopy with Dr. Loletha Carrow.  Both procedures were discussed in detail with the patient including indications risks and benefits and she is agreeable to proceed. Xarelto will need to be held for 24 to 48 hours prior to procedure.  We will communicate with her cardiologist Dr. Marlou Porch to assure that holding Xarelto is reasonable for this patient. Further recommendations pending findings of above.   Imraan Wendell Genia Harold PA-C 05/23/2018   Cc: [name], MD

## 2018-05-23 NOTE — Telephone Encounter (Signed)
[facility] Pre-operative Risk Assessment     Request for surgical clearance:     Endoscopy Procedure  What type of surgery is being performed?     EGD/Colon  When is this surgery scheduled?     06-26-2018  What type of clearance is required ?   Pharmacy  Are there any medications that need to be held prior to surgery and how long? Xarelto- hold 2 days prep to procedure date.  Practice name and name of physician performing surgery?      [facility]: Dr. Wilfrid Lund  What is your office phone and fax number?      Phone- [phone]  [phone]  Anesthesia type (None, local, MAC, general) ?       MAC

## 2018-05-23 NOTE — Patient Instructions (Addendum)
You have been scheduled for an endoscopy and colonoscopy. Please follow the written instructions given to you at your visit today. We have given you the colonoscopy prep sample.  If you use inhalers (even only as needed), please bring them with you on the day of your procedure.  We will call you with the Xarelto clearance instructions from Dr. [name].   Normal BMI (Body Mass Index- based on height and weight) is between 19 and 25. Your BMI today is Body mass index is 30.61 kg/m. Theresa Harrington Please consider follow up  regarding your BMI with your Primary Care Provider.

## 2018-05-24 NOTE — Telephone Encounter (Signed)
Patient with diagnosis of atrial fibrillation on Xarelto for anticoagulation.    Procedure: EGD/colonoscopy Date of procedure: 06/26/2018  CHADS2-VASc score of  4 (, HTN,, DM2, , CAD, , female)  CrCl 100 Platelet count 278  Per office protocol, patient can hold Xarelto for 2 days prior to procedure.

## 2018-05-24 NOTE — Telephone Encounter (Signed)
Per office protocol patient can be off Xarelto 2 days prior to procedure.  Kerin Ransom PA-C 05/24/2018 3:44 PM

## 2018-05-27 ENCOUNTER — Telehealth: Payer: Self-pay | Admitting: *Deleted

## 2018-05-27 NOTE — Telephone Encounter (Signed)
Called the patient to advise to hold the Xarelto on 06-24-18, 2-25 and 2-26.  Dr Loletha Carrow will tell her when to restart after the procedure. The patient verbalized the instructions and understood.

## 2018-05-28 ENCOUNTER — Other Ambulatory Visit: Payer: Self-pay | Admitting: Pharmacy Technician

## 2018-05-28 NOTE — Patient Outreach (Signed)
[facility]) Care Management  05/28/2018  Theresa Harrington 1955/10/09 [id number]  3rd outreach call placed to patient in regards to patient assistance applications for Zetia through [employer] and for Humalog & Trulicity through [employer].  Spoke to patient, HIPAA identifiers verified. Inquired if patient had mailed back the applications for patient assistance. Patient stated she was not going to qualify for them. She stated when she was filling out the application on line she said she was denied. Inquired if patient was speaking about the LIS with SS or the applications and patient stated LIS. Informed patient that we could still try to apply for patient assistance with the companies as you do not have to have LIS to apply. Patient agreed to fill out the applications for the patient assistance programs. She stated if there was a deadline to have the forms turned in. Informed patient she could fill them out at her leisure and that I would submit it to the company when I received them back . Patient stated she would fill them out tonight and place in the mail tomorrow.  When applications have been received back then I will submit to companies.  Laisa Larrick P. Tyreon Frigon, [place] Management [phone]

## 2018-05-30 ENCOUNTER — Other Ambulatory Visit: Payer: Self-pay | Admitting: Pharmacy Technician

## 2018-05-30 NOTE — Patient Outreach (Signed)
[facility]) Care Management  05/30/2018  Theresa Harrington December 24, 1955 [id number]    Incoming call received from patient in regards to her patient assistance applications for Humalog and Trulicity through [place] through [employer].  Patient had questions concerning her applications, HIPAA identifiers verified. Discussed and answered the questions had regarding her applications and the documents that the programs required. Patient verbalized understanding. Patient says she will place in mail today or tomorrow.  Will followup with patient in 10-14 business days if applications have not been received back.  Kloi Brodman P. Maximiano Lott, [place] Management [phone]

## 2018-05-31 ENCOUNTER — Other Ambulatory Visit: Payer: Self-pay | Admitting: Pharmacist

## 2018-05-31 ENCOUNTER — Telehealth: Payer: Self-pay | Admitting: Internal Medicine

## 2018-05-31 NOTE — Telephone Encounter (Signed)
What is the reason for the change?   We are working on patient assistance for the Levemir.   Please advise

## 2018-05-31 NOTE — Telephone Encounter (Signed)
Copied from [place] [phone]. Topic: General - Other >> May 31, 2018 12:46 PM Keene Breath wrote: Reason for CRM: Benjamine Mola with [place] [employer] called to request the doctor to approve an alternative for the patient's Insulin Detemir (LEVEMIR FLEXTOUCH) 100 UNIT/ML Pen medication.  Asked if the doctor would approve Basaglar instead.  Please advise and call back at [phone]

## 2018-05-31 NOTE — Patient Outreach (Signed)
[facility]) Care Management  05/31/2018  Parminder Trapani [birth year] [id number]   Receive an Theresa Harrington message from [place] [facility] asking me to reach out to the patient regarding medication assistance for her Levemir. Called and spoke with patient. HIPAA identifiers verified.  [name] states that she is currently working with her PCP office to receive patient assistance through [facility] for Levemir. Note that the [facility] patient assistance program requires Medicare Part D patients to have spent $1,000 on prescription medicine in the current calendar year to be eligible. Patient denies having yet met this out of pocket expenditure requirement.  Note that the [employer] patient assistance program, which provides assistance for Basaglar insulin does not have an out of pocket expenditure requirement. Patient is interested in being switched from Levemir to [place] to allow her to apply for this assistance, if her PCP agrees to this change.  PLAN  Will contact patient's PCP office to ask if provider is willing to switch patient's long-acting insulin from Levemir to [place] to allow for patient to receive patient assistance for this medication.  Harlow Asa, PharmD, [place] Management [phone]

## 2018-05-31 NOTE — Patient Outreach (Signed)
[facility]) Care Management  05/31/2018  Theresa Harrington 1955-06-23 [id number]   Contact patient's PCP office to ask if provider is willing to switch patient's long-acting insulin from Levemir to [place] to allow for patient to receive patient assistance for this medication. Leave message with [name] in the office.  Harlow Asa, PharmD, [place] Management [phone]

## 2018-05-31 NOTE — Telephone Encounter (Signed)
Levemir patient assistance has an out of pocket requirement this year, however, Basaglar does not. Could pt change to the [place]?

## 2018-06-02 ENCOUNTER — Other Ambulatory Visit: Payer: Self-pay | Admitting: Internal Medicine

## 2018-06-02 DIAGNOSIS — IMO0002 Reserved for concepts with insufficient information to code with codable children: Secondary | ICD-10-CM

## 2018-06-02 DIAGNOSIS — E1151 Type 2 diabetes mellitus with diabetic peripheral angiopathy without gangrene: Secondary | ICD-10-CM

## 2018-06-02 DIAGNOSIS — E1165 Type 2 diabetes mellitus with hyperglycemia: Principal | ICD-10-CM

## 2018-06-02 MED ORDER — BASAGLAR KWIKPEN 100 UNIT/ML ~~LOC~~ SOPN: 80.0000 [IU] | PEN_INJECTOR | Freq: Every day | SUBCUTANEOUS | 1 refills | Status: DC

## 2018-06-02 NOTE — Telephone Encounter (Signed)
Yes, she can change to basaglar  RX printed

## 2018-06-04 NOTE — Telephone Encounter (Signed)
Left message for Benjamine Mola informing it is okay to change pt to [employer].

## 2018-06-05 NOTE — Telephone Encounter (Signed)
Contacted pt and informed of the change from Levemir to [place] and the reason for the changes. Pt is in agreement.

## 2018-06-06 ENCOUNTER — Other Ambulatory Visit: Payer: Self-pay | Admitting: Pharmacy Technician

## 2018-06-06 NOTE — Telephone Encounter (Signed)
Received paper work for patient assistance.   PCP has signed.

## 2018-06-06 NOTE — Patient Outreach (Signed)
[facility]) Care Management  06/06/2018  Garnet Chatmon 09-28-1955 [id number]   Received all necessary documents and signatures for the application for [employer] (zetia) and [employer] (humalog, trulicity).  Submitted completed Financial risk analyst. Fax completed [place] manager. Still waiting on the provider part for basaglar as it was added later, will fax that when provider sends it over.  Will followup in 10-14 business days with both companies to inquire on status of application.  Rhylei Mcquaig P. Shaily Librizzi, [place] Management [phone]

## 2018-06-07 NOTE — Telephone Encounter (Signed)
Papers have been faxed to [facility].

## 2018-06-10 ENCOUNTER — Other Ambulatory Visit: Payer: Self-pay | Admitting: Pharmacy Technician

## 2018-06-10 NOTE — Patient Outreach (Signed)
[facility]) Care Management  06/10/2018  Theresa Harrington [birth year] [id number]  Received back the provider portion of the [employer] application for [employer].  Faxed the completed portion over to [facility] today. Original application for Humalog and Trulicity was faxed on 10/30/16.  Will followup with Lilly in 7-10 business days to inquire on status of application.  Theresa Harrington P. Mekiah Wahler, [place] Management [phone]

## 2018-06-13 ENCOUNTER — Other Ambulatory Visit: Payer: Self-pay | Admitting: Pharmacy Technician

## 2018-06-13 NOTE — Patient Outreach (Signed)
[facility]) Care Management  06/13/2018  Maressa Apollo Nov 21, 1955 [id number]    Care coordination call placed to [facility] in regards to patient's application for Humalog, Basaglar and Trulicity.  Spoke to Denzel who informed that patient had been APPROVED 06/12/2018-05/01/2019. Per Denzel patient will be receiving 6 boxes of the Humalog Kwikpen, 5 boxes of Trulicity and 7 boxes of Basaglar. Denzel advised that the shipment should arrive at the provider's office withing the next 5-7 business days.  Will followup with patient in 7-10 business days to inquire if medication has been picked up.  Erikson Danzy P. Stacia Feazell, [place] Management [phone]

## 2018-06-20 ENCOUNTER — Other Ambulatory Visit: Payer: Self-pay | Admitting: Pharmacy Technician

## 2018-06-20 NOTE — Patient Outreach (Signed)
[facility]) Care Management  06/20/2018  Leoda Smithhart 02-26-1956 [id number]  Successful outreach call placed to patient in regards to her [employer] application for Humalog, Trulicity and [employer].   Spoke to patient, HIPAA identifiers verified. Patient informed me that she had already picked up the Humalog and Trulicity and was trying to go by the provider's office today to pick up the [place]. Informed patient how to obtain her refills and to avoid a delay in therapy by following up with her provider's office when she gets down to around 1 month supply so the provider can reach out to [employer] to request a refill. The refill has to go through the provider's office. Patient verbalized understanding. Informed patient that we were still working on her Scientist, clinical (histocompatibility and immunogenetics) for [employer] and that [employer] was experiencing some delays in Psychiatric nurse. Informed patient I would be in touch with her concerning that program.  Will followup with patient in the next 14-21 business days to update on merck application.  Viktorya Arguijo P. Reatha Sur, [place] Management [phone]

## 2018-06-21 ENCOUNTER — Other Ambulatory Visit: Payer: Self-pay | Admitting: Pharmacist

## 2018-06-21 ENCOUNTER — Telehealth: Payer: Self-pay | Admitting: Gastroenterology

## 2018-06-21 NOTE — Patient Outreach (Signed)
[facility]) Care Management  06/21/2018  Kanaya Gunnarson 22-Sep-1955 [id number]   Receive a call back from Ms. Koob. HIPAA identifiers verified.  Ms. Hollenkamp expresses appreciation for assistance with getting patient assistance through [facility]. Reports that she is going to her PCP office today to pick up her [place].  Patient verbalizes understanding about the changes that her PCP has made to her diabetes medication regimen to allow for this patient assistance. Confirms understanding about the change of her long-acting insulin from Levemir 80 units once daily to the Basaglar 80 units once daily and about the change of her meal-time insulin, Novolog to Humalog. Reports that for the meal time insulin, she currently uses between 16-20 units with each meal per her sliding scale as directed by her provider. Reports that her blood sugars have been "good' recently. Reports that her fasting blood sugar this morning was 79 mg/dL and that following her meal was 116 mg/dL. Patient also reports that she is taking her Trulicity once weekly as directed on Fridays.  Patient reports that she still has a few days of Levemir insulin left and that she plans to use up this supply before switching from this to the [place]. Encourage patient to continue to check her blood sugar regularly as directed and particularly as making this switch. Review with patient how to monitor for and manage low blood sugars. Patient verbalizes understanding.  Patient asks about future determination of her eligibility for the [facility] Patient Assistance program. Remind Ms. Frechette that she will have to apply again for the assistance program for the next calendar year.  Ms. Vaquerano denies any further medication questions/concerns at this time.    PLAN  Will continue to follow with [facility] CPhT Sharee Pimple Simcox as she continues to assist patient with her application for patient assistance for Zetia through [employer].  Harlow Asa,  PharmD, [place] Management [phone]

## 2018-06-21 NOTE — Telephone Encounter (Signed)
Pt sched for colon 2.26.20.  Pt reported she had a burger with lettuce and tomato today.  Pt would like to know if she needs to resched.  Please advise.

## 2018-06-21 NOTE — Patient Outreach (Signed)
[facility]) Care Management  06/21/2018  Patrycja Mumpower August 18, 1955 [id number]   Receive an Suanne Marker message from [place] [facility] asking me to follow up with the patient to review with her each of the medications that she has received through patient assistance. Leave a HIPAA compliant message on the patient's voicemail. If have not heard from patient by next week, will give her another call at that time.   Harlow Asa, PharmD, [place] Management [phone]

## 2018-06-24 ENCOUNTER — Telehealth: Payer: Self-pay | Admitting: Gastroenterology

## 2018-06-24 NOTE — Telephone Encounter (Signed)
Returned phone call to patient and advised her it is okay to proceed with scheduled procedures though she has not followed her diet as instructed. She is to follow the diet guidelines strictly now.   Reviewed oral, regular and long acting insulin instructions as well.   Patient also wanted to verify date for stopping Xarelto. She took this morning's dose and is aware she is to stop Xarelto 2 days prior to procedure.   Discussed with Dr. Loletha Carrow who states it is okay to proceed with scheduled procedures in light of above information.  Patient states understanding and feels all questions have been answered.

## 2018-06-24 NOTE — Telephone Encounter (Signed)
Left message to return call 

## 2018-06-25 NOTE — Telephone Encounter (Signed)
Left a message to return call.  

## 2018-06-26 ENCOUNTER — Ambulatory Visit (AMBULATORY_SURGERY_CENTER): Payer: PPO | Admitting: Gastroenterology

## 2018-06-26 ENCOUNTER — Encounter: Payer: Self-pay | Admitting: Certified Registered Nurse Anesthetist

## 2018-06-26 VITALS — BP 149/65 | HR 58 | Temp 96.6°F | Resp 21 | Ht 65.0 in | Wt 186.0 lb

## 2018-06-26 DIAGNOSIS — K5730 Diverticulosis of large intestine without perforation or abscess without bleeding: Secondary | ICD-10-CM

## 2018-06-26 DIAGNOSIS — K449 Diaphragmatic hernia without obstruction or gangrene: Secondary | ICD-10-CM | POA: Diagnosis not present

## 2018-06-26 DIAGNOSIS — D175 Benign lipomatous neoplasm of intra-abdominal organs: Secondary | ICD-10-CM

## 2018-06-26 DIAGNOSIS — K219 Gastro-esophageal reflux disease without esophagitis: Secondary | ICD-10-CM | POA: Diagnosis not present

## 2018-06-26 DIAGNOSIS — K3189 Other diseases of stomach and duodenum: Secondary | ICD-10-CM | POA: Diagnosis not present

## 2018-06-26 DIAGNOSIS — K2100 Gastro-esophageal reflux disease with esophagitis, without bleeding: Secondary | ICD-10-CM

## 2018-06-26 DIAGNOSIS — K229 Disease of esophagus, unspecified: Secondary | ICD-10-CM | POA: Diagnosis not present

## 2018-06-26 DIAGNOSIS — D124 Benign neoplasm of descending colon: Secondary | ICD-10-CM

## 2018-06-26 DIAGNOSIS — D509 Iron deficiency anemia, unspecified: Secondary | ICD-10-CM

## 2018-06-26 DIAGNOSIS — D123 Benign neoplasm of transverse colon: Secondary | ICD-10-CM

## 2018-06-26 DIAGNOSIS — R195 Other fecal abnormalities: Secondary | ICD-10-CM | POA: Diagnosis not present

## 2018-06-26 LAB — HM COLONOSCOPY

## 2018-06-26 MED ORDER — SODIUM CHLORIDE 0.9 % IV SOLN: 500.0000 mL | Freq: Once | INTRAVENOUS | Status: DC

## 2018-06-26 MED ORDER — OMEPRAZOLE 40 MG PO CPDR: 40.0000 mg | DELAYED_RELEASE_CAPSULE | Freq: Two times a day (BID) | ORAL | 2 refills | Status: DC

## 2018-06-26 NOTE — Telephone Encounter (Signed)
See phone note 2-24 for additional info

## 2018-06-26 NOTE — Op Note (Signed)
[place] Patient Name: Theresa Harrington Procedure Date: 06/26/2018 10:28 AM MRN: [id number] Endoscopist: [name] L. Loletha Carrow , MD Age: 63 Referring MD:  Date of Birth: 04/30/1956 Gender: Female Account #: [id number] Procedure:                Colonoscopy Indications:              Iron deficiency anemia, Positive Cologuard test Medicines:                Monitored Anesthesia Care Procedure:                Pre-Anesthesia Assessment:                           - Prior to the procedure, a History and Physical                            was performed, and patient medications and                            allergies were reviewed. The patient's tolerance of                            previous anesthesia was also reviewed. The risks                            and benefits of the procedure and the sedation                            options and risks were discussed with the patient.                            All questions were answered, and informed consent                            was obtained. Prior Anticoagulants: The patient has                            taken Xarelto (rivaroxaban), last dose was 2 days                            prior to procedure. ASA Grade Assessment: III - A                            patient with severe systemic disease. After                            reviewing the risks and benefits, the patient was                            deemed in satisfactory condition to undergo the                            procedure.  After obtaining informed consent, the colonoscope                            was passed under direct vision. Throughout the                            procedure, the patient's blood pressure, pulse, and                            oxygen saturations were monitored continuously. The                            Colonoscope was introduced through the anus and                            advanced to the the cecum, identified by                       appendiceal orifice and ileocecal valve. The                            colonoscopy was performed without difficulty. The                            patient tolerated the procedure well. The quality                            of the bowel preparation was excellent. The                            ileocecal valve, appendiceal orifice, and rectum                            were photographed. Scope In: 10:48:27 AM Scope Out: 11:20:29 AM Scope Withdrawal Time: 0 hours 24 minutes 15 seconds  Total Procedure Duration: 0 hours 32 minutes 2 seconds  Findings:                 The digital rectal exam findings include decreased                            sphincter tone.                           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                           There was a medium-sized lipoma in the proximal                            ascending colon.                           Two sessile polyps were found in the transverse  colon. The polyps were diminutive in size. These                            polyps were removed with a cold snare. Resection                            and retrieval were complete.                           A 12 mm polyp was found in the splenic flexure. The                            polyp was semi-pedunculated. The polyp was removed                            with a hot snare. Resection and retrieval were                            complete. To prevent bleeding post-intervention,                            one hemostatic clip was successfully placed (MR                            conditional).                           A diminutive polyp was found in the descending                            colon. The polyp was sessile. The polyp was removed                            with a cold snare. Resection and retrieval were                            complete.                           Multiple diverticula were found in the left  colon                            with associated tortuosity and haustral thickening                            (and spasticity).                           The exam was otherwise without abnormality on                            direct and retroflexion views. Complications:            No immediate complications. Estimated Blood Loss:     Estimated blood loss was minimal. Impression:               -  Decreased sphincter tone found on digital rectal                            exam.                           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                           - Medium-sized lipoma in the proximal ascending                            colon.                           - Two diminutive polyps in the transverse colon,                            removed with a cold snare. Resected and retrieved.                           - One 12 mm polyp at the splenic flexure, removed                            with a hot snare. Resected and retrieved. Clip (MR                            conditional) was placed.                           - One diminutive polyp in the descending colon,                            removed with a cold snare. Resected and retrieved.                           - Diverticulosis in the left colon.                           - The examination was otherwise normal on direct                            and retroflexion views. Recommendation:           - Patient has a contact number available for                            emergencies. The signs and symptoms of potential                            delayed complications were discussed with the                            patient. Return to normal activities tomorrow.  Written discharge instructions were provided to the                            patient.                           - Resume previous diet.                           - Resume Xarelto (rivaroxaban) at prior dose in 2                             days.                           - Await pathology results.                           - Repeat colonoscopy is recommended for                            surveillance. The colonoscopy date will be                            determined after pathology results from today's                            exam become available for review.                           - Return to primary care physician to monitor                            anemia.                           - See the other procedure note for documentation of                            additional recommendations. Anuel Sitter L. Loletha Carrow, MD 06/26/2018 11:41:54 AM This report has been signed electronically.

## 2018-06-26 NOTE — Progress Notes (Signed)
PT taken to PACU. Monitors in place. VSS. Report given to RN. 

## 2018-06-26 NOTE — Op Note (Signed)
[place] Patient Name: Theresa Harrington Procedure Date: 06/26/2018 10:28 AM MRN: [id number] Endoscopist: [name] L. Loletha Carrow , MD Age: 63 Referring MD:  Date of Birth: 06/10/1955 Gender: Female Account #: [id number] Procedure:                Upper GI endoscopy Indications:              Iron deficiency anemia Medicines:                Monitored Anesthesia Care Procedure:                Pre-Anesthesia Assessment:                           - Prior to the procedure, a History and Physical                            was performed, and patient medications and                            allergies were reviewed. The patient's tolerance of                            previous anesthesia was also reviewed. The risks                            and benefits of the procedure and the sedation                            options and risks were discussed with the patient.                            All questions were answered, and informed consent                            was obtained. Prior Anticoagulants: The patient has                            taken Xarelto (rivaroxaban), last dose was 2 days                            prior to procedure. ASA Grade Assessment: III - A                            patient with severe systemic disease. After                            reviewing the risks and benefits, the patient was                            deemed in satisfactory condition to undergo the                            procedure.  After obtaining informed consent, the endoscope was                            passed under direct vision. Throughout the                            procedure, the patient's blood pressure, pulse, and                            oxygen saturations were monitored continuously. The                            Endoscope was introduced through the mouth, and                            advanced to the second part of duodenum. The upper             GI endoscopy was accomplished without difficulty.                            The patient tolerated the procedure well. Scope In: Scope Out: Findings:                 LA Grade D (one or more mucosal breaks involving at                            least 75% of esophageal circumference) esophagitis                            with bleeding was found in the lower third of the                            esophagus. Biopsies were taken with a cold forceps                            for histology from nodular EG junction mucosa.                           A 4 cm hiatal hernia was present with extrinsic                            compression from the patient's known LapBand.                           The stomach was normal.                           The cardia and gastric fundus were normal on                            retroflexion.                           The examined duodenum was normal. Complications:  No immediate complications. Estimated Blood Loss:     Estimated blood loss was minimal. Impression:               - LA Grade D reflux esophagitis. Biopsied.                           - 4 cm hiatal hernia.                           - Normal stomach.                           - Normal examined duodenum. Recommendation:           - Patient has a contact number available for                            emergencies. The signs and symptoms of potential                            delayed complications were discussed with the                            patient. Return to normal activities tomorrow.                            Written discharge instructions were provided to the                            patient.                           - Resume previous diet.                           - Resume Xarelto (rivaroxaban) at prior dose in 2                            days.                           - Await pathology results.                           - Use Prilosec (omeprazole) 40 mg PO  BID for 8                            weeks. Disp #60, RF 2                           - Repeat upper endoscopy in 8 weeks to evaluate the                            response to therapy.                           - See the other procedure note for documentation  of                            additional recommendations. Henry L. Loletha Carrow, MD 06/26/2018 11:50:56 AM This report has been signed electronically.

## 2018-06-26 NOTE — Progress Notes (Signed)
Patient needs 8 week EGD scheduled, could not pull up a schedule today to do, not out yet.

## 2018-06-26 NOTE — Progress Notes (Signed)
Pt's states no medical or surgical changes since previsit or office visit. 

## 2018-06-26 NOTE — Patient Instructions (Signed)
Resume Xarelto at the same dose in two days, on Friday Return to [facility] care physician to monitor anemia Use Prilosec 40 mg twice a week for 8 weeks and repeat EGD in 8 weeks, office will call to schedule it  YOU HAD AN ENDOSCOPIC PROCEDURE TODAY AT [place]:   Refer to the procedure report that was given to you for any specific questions about what was found during the examination.  If the procedure report does not answer your questions, please call your gastroenterologist to clarify.  If you requested that your care partner not be given the details of your procedure findings, then the procedure report has been included in a sealed envelope for you to review at your convenience later.  YOU SHOULD EXPECT: Some feelings of bloating in the abdomen. Passage of more gas than usual.  Walking can help get rid of the air that was put into your GI tract during the procedure and reduce the bloating. If you had a lower endoscopy (such as a colonoscopy or flexible sigmoidoscopy) you may notice spotting of blood in your stool or on the toilet paper. If you underwent a bowel prep for your procedure, you may not have a normal bowel movement for a few days.  Please Note:  You might notice some irritation and congestion in your nose or some drainage.  This is from the oxygen used during your procedure.  There is no need for concern and it should clear up in a day or so.  SYMPTOMS TO REPORT IMMEDIATELY:   Following lower endoscopy (colonoscopy or flexible sigmoidoscopy):  Excessive amounts of blood in the stool  Significant tenderness or worsening of abdominal pains  Swelling of the abdomen that is new, acute  Fever of 100F or higher   Following upper endoscopy (EGD)  Vomiting of blood or coffee ground material  New chest pain or pain under the shoulder blades  Painful or persistently difficult swallowing  New shortness of breath  Fever of 100F or higher  Black, tarry-looking  stools  For urgent or emergent issues, a gastroenterologist can be reached at any hour by calling [phone].   DIET:  We do recommend a small meal at first, but then you may proceed to your regular diet.  Drink plenty of fluids but you should avoid alcoholic beverages for 24 hours.  ACTIVITY:  You should plan to take it easy for the rest of today and you should NOT DRIVE or use heavy machinery until tomorrow (because of the sedation medicines used during the test).    FOLLOW UP: Our staff will call the number listed on your records the next business day following your procedure to check on you and address any questions or concerns that you may have regarding the information given to you following your procedure. If we do not reach you, we will leave a message.  However, if you are feeling well and you are not experiencing any problems, there is no need to return our call.  We will assume that you have returned to your regular daily activities without incident.  If any biopsies were taken you will be contacted by phone or by letter within the next 1-3 weeks.  Please call us at [phone] if you have not heard about the biopsies in 3 weeks.    SIGNATURES/CONFIDENTIALITY: You and/or your care partner have signed paperwork which will be entered into your electronic medical record.  These signatures attest to the fact that that  the information above on your After Visit Summary has been reviewed and is understood.  Full responsibility of the confidentiality of this discharge information lies with you and/or your care-partner.

## 2018-06-26 NOTE — Progress Notes (Signed)
Called to room to assist during endoscopic procedure.  Patient ID and intended procedure confirmed with present staff. Received instructions for my participation in the procedure from the performing physician.  

## 2018-06-27 ENCOUNTER — Telehealth: Payer: Self-pay

## 2018-06-27 ENCOUNTER — Telehealth: Payer: Self-pay | Admitting: *Deleted

## 2018-06-27 NOTE — Telephone Encounter (Signed)
Left message on f/u call 

## 2018-06-27 NOTE — Telephone Encounter (Signed)
  Follow up Call-  Call back number 06/26/2018  Post procedure Call Back phone  # [phone]  Permission to leave phone message Yes  Some recent data might be hidden     Patient questions:  Do you have a fever, pain , or abdominal swelling? No. Pain Score  0 *  Have you tolerated food without any problems? Yes.    Have you been able to return to your normal activities? Yes.    Do you have any questions about your discharge instructions: Diet   No. Medications  No. Follow up visit  No.  Do you have questions or concerns about your Care? No.  Actions: * If pain score is 4 or above: No action needed, pain <4.

## 2018-07-04 ENCOUNTER — Other Ambulatory Visit: Payer: Self-pay | Admitting: Pharmacy Technician

## 2018-07-04 NOTE — Patient Outreach (Signed)
[place] [facility]) Care Management  07/04/2018  Theresa Harrington [birth year] [id number]   Care coordination call placed to [employer] in reference to [place] application.  Spoke to [place] who said they had received the patient's application on 06/10/3126 and had mailed out that attestation form to the patient on the same date. [name] informed that once that attestation form and the original application are mailed back in then the processing would continue.  Will relay this information to the patient and followup with her in 7-10 business days if call from patient is not returned.  Megan Presti P. Brecklyn Galvis, [place] Management [phone]

## 2018-07-04 NOTE — Patient Outreach (Signed)
[place] [facility]) Care Management  07/04/2018  Theresa Harrington [birth year] [id number]   ADDENDUM  Successful outreach call placed to patient in regards to [employer] application for [place].  Spoke to patient, HIPAA identifiers verified.Informed patient that [employer] had processed the application and had mailed the attestation form to her home. Informed patient to call me when she receives the letter so we could discuss it together and help answer any questions she may have. Patient verbalized understanding.  Will followup with patient in 7-10 business days if call is not returned.  [name] P. [name], [place] Management [phone]

## 2018-07-09 DIAGNOSIS — M21612 Bunion of left foot: Secondary | ICD-10-CM | POA: Diagnosis not present

## 2018-07-09 DIAGNOSIS — Z8631 Personal history of diabetic foot ulcer: Secondary | ICD-10-CM | POA: Diagnosis not present

## 2018-07-09 DIAGNOSIS — M21611 Bunion of right foot: Secondary | ICD-10-CM | POA: Diagnosis not present

## 2018-07-09 DIAGNOSIS — S98132A Complete traumatic amputation of one left lesser toe, initial encounter: Secondary | ICD-10-CM | POA: Diagnosis not present

## 2018-07-09 DIAGNOSIS — E1151 Type 2 diabetes mellitus with diabetic peripheral angiopathy without gangrene: Secondary | ICD-10-CM | POA: Diagnosis not present

## 2018-07-10 ENCOUNTER — Telehealth: Payer: Self-pay | Admitting: Gastroenterology

## 2018-07-10 NOTE — Telephone Encounter (Signed)
Left message for pt that she can call the office back to schedule her OV and she may schedule it with the [facility] that answers the phone.

## 2018-07-11 ENCOUNTER — Encounter: Payer: Self-pay | Admitting: Gastroenterology

## 2018-07-11 LAB — HM COLONOSCOPY

## 2018-07-17 ENCOUNTER — Other Ambulatory Visit: Payer: Self-pay | Admitting: Pharmacy Technician

## 2018-07-17 NOTE — Patient Outreach (Signed)
[place] [facility]) Care Management  07/17/2018  Theresa Harrington Jun 23, 1955 [id number]  Successful outreach call placed to patient in regards to [place] application for [place].  Spoke to patient, HIPAA identifiers verified.  Inquired if patient had received the attestation letter in the mail. Patient informed she had received the letter but had not had the chance to call me. Discussed letter with patient and she was able to complete the form. Informed patient to put the letter and all other documentation in the envelope provided and mail back to [employer]. Patient informed she would place in the mail tomorrow.  Will followup with [employer] in 7-14 business days to inquire on status of application.  Delroy Ordway P. Cambrie Sonnenfeld, [place] Management [phone]

## 2018-07-20 ENCOUNTER — Other Ambulatory Visit: Payer: Self-pay | Admitting: Internal Medicine

## 2018-07-20 DIAGNOSIS — E519 Thiamine deficiency, unspecified: Secondary | ICD-10-CM

## 2018-07-21 ENCOUNTER — Telehealth: Payer: Self-pay | Admitting: *Deleted

## 2018-07-21 NOTE — Telephone Encounter (Signed)
Covid-19 travel screening questions  Have you traveled in the last 14 days? no If yes where?  Do you now or have you had a fever in the last 14 days? no  Do you have any respiratory symptoms of shortness of breath or cough now or in the last 14 days? no  Do you have a medical history of Congestive Heart Failure? no  Do you have a medical history of lung disease? no  Do you have any family members or close contacts with diagnosed or suspected Covid-19? no       

## 2018-07-22 ENCOUNTER — Ambulatory Visit (INDEPENDENT_AMBULATORY_CARE_PROVIDER_SITE_OTHER): Payer: PPO

## 2018-07-22 ENCOUNTER — Other Ambulatory Visit: Payer: Self-pay

## 2018-07-22 ENCOUNTER — Other Ambulatory Visit (INDEPENDENT_AMBULATORY_CARE_PROVIDER_SITE_OTHER): Payer: PPO

## 2018-07-22 ENCOUNTER — Ambulatory Visit (AMBULATORY_SURGERY_CENTER): Payer: Self-pay

## 2018-07-22 ENCOUNTER — Other Ambulatory Visit: Payer: Self-pay | Admitting: Internal Medicine

## 2018-07-22 VITALS — Temp 98.2°F | Ht 65.5 in | Wt 179.0 lb

## 2018-07-22 DIAGNOSIS — E1151 Type 2 diabetes mellitus with diabetic peripheral angiopathy without gangrene: Secondary | ICD-10-CM

## 2018-07-22 DIAGNOSIS — K2100 Gastro-esophageal reflux disease with esophagitis, without bleeding: Secondary | ICD-10-CM

## 2018-07-22 DIAGNOSIS — IMO0002 Reserved for concepts with insufficient information to code with codable children: Secondary | ICD-10-CM

## 2018-07-22 DIAGNOSIS — E1165 Type 2 diabetes mellitus with hyperglycemia: Secondary | ICD-10-CM

## 2018-07-22 DIAGNOSIS — E538 Deficiency of other specified B group vitamins: Secondary | ICD-10-CM

## 2018-07-22 LAB — HEMOGLOBIN A1C: Hgb A1c MFr Bld: 8.3 % — ABNORMAL HIGH (ref 4.6–6.5)

## 2018-07-22 MED ORDER — CYANOCOBALAMIN 1000 MCG/ML IJ SOLN
1000.0000 ug | Freq: Once | INTRAMUSCULAR | Status: AC
  Administered 2018-07-22: 1000 ug via INTRAMUSCULAR

## 2018-07-22 NOTE — Progress Notes (Signed)
Denies allergies to eggs or soy products. Denies complication of anesthesia or sedation. Denies use of weight loss medication. Denies use of O2.   Emmi instructions declined.   Patient verbalizes understanding of Endoscopy instructions.

## 2018-07-22 NOTE — Progress Notes (Signed)
I have reviewed and agree.

## 2018-07-23 ENCOUNTER — Encounter: Payer: Self-pay | Admitting: Gastroenterology

## 2018-07-29 ENCOUNTER — Encounter: Payer: PPO | Admitting: Gastroenterology

## 2018-07-29 ENCOUNTER — Telehealth: Payer: Self-pay | Admitting: *Deleted

## 2018-07-29 ENCOUNTER — Telehealth: Payer: Self-pay | Admitting: Gastroenterology

## 2018-07-29 NOTE — Telephone Encounter (Signed)
About what time did she take it on Sunday?

## 2018-07-29 NOTE — Telephone Encounter (Signed)
She can have her procedure tomorrow as scheduled.  Thank you for checking.

## 2018-07-29 NOTE — Telephone Encounter (Signed)
Pt is scheduled for EGD 07/30/18 in the [place]. Pt was supposed to hold xarelto Sunday and Monday but she forgot and took the xarelto Sunday. Pt wants to know if she can still have her EGD. Please advise.

## 2018-07-29 NOTE — Telephone Encounter (Signed)
Sunday at [place].

## 2018-07-29 NOTE — Telephone Encounter (Signed)
Called pt. Notified she could still have her procedure tomorrow, per Dr.Danis.Pt. knows to hold xarelto

## 2018-07-29 NOTE — Telephone Encounter (Signed)
Left message for pt to call back  °

## 2018-07-29 NOTE — Telephone Encounter (Signed)
Covid-19 travel screening questions  Have you traveled in the last 14 days? no If yes where?  Do you now or have you had a fever in the last 14 days? no  Do you have any respiratory symptoms of shortness of breath or cough now or in the last 14 days? no  Do you have a medical history of Congestive Heart Failure? no  Do you have a medical history of lung disease? no  Do you have any family members or close contacts with diagnosed or suspected Covid-19? no       

## 2018-07-30 ENCOUNTER — Encounter: Payer: Self-pay | Admitting: Gastroenterology

## 2018-07-30 ENCOUNTER — Ambulatory Visit (AMBULATORY_SURGERY_CENTER): Payer: PPO | Admitting: Gastroenterology

## 2018-07-30 ENCOUNTER — Other Ambulatory Visit: Payer: Self-pay

## 2018-07-30 VITALS — BP 142/66 | HR 49 | Temp 97.7°F | Resp 18 | Ht 65.5 in | Wt 179.0 lb

## 2018-07-30 DIAGNOSIS — K2100 Gastro-esophageal reflux disease with esophagitis, without bleeding: Secondary | ICD-10-CM

## 2018-07-30 DIAGNOSIS — K2210 Ulcer of esophagus without bleeding: Secondary | ICD-10-CM | POA: Diagnosis not present

## 2018-07-30 DIAGNOSIS — K22711 Barrett's esophagus with high grade dysplasia: Secondary | ICD-10-CM | POA: Diagnosis not present

## 2018-07-30 DIAGNOSIS — K219 Gastro-esophageal reflux disease without esophagitis: Secondary | ICD-10-CM | POA: Diagnosis not present

## 2018-07-30 DIAGNOSIS — K2270 Barrett's esophagus without dysplasia: Secondary | ICD-10-CM | POA: Diagnosis not present

## 2018-07-30 DIAGNOSIS — I4891 Unspecified atrial fibrillation: Secondary | ICD-10-CM | POA: Diagnosis not present

## 2018-07-30 MED ORDER — SODIUM CHLORIDE 0.9 % IV SOLN: 500.0000 mL | Freq: Once | INTRAVENOUS | Status: DC

## 2018-07-30 MED ORDER — OMEPRAZOLE 40 MG PO CPDR: 40.0000 mg | DELAYED_RELEASE_CAPSULE | Freq: Every day | ORAL | 3 refills | Status: DC

## 2018-07-30 NOTE — Op Note (Signed)
[facility] Patient Name: Theresa Harrington Procedure Date: 07/30/2018 10:04 AM MRN: [id number] Endoscopist: [name] L. Loletha Carrow , MD Age: 63 Referring MD:  Date of Birth: [birth year] Gender: Female Account #: [id number] Procedure:                Upper GI endoscopy Indications:              Follow-up of reflux esophagitis, Barrett's high                            grade dysplasia Medicines:                Monitored Anesthesia Care Procedure:                Pre-Anesthesia Assessment:                           - Prior to the procedure, a History and Physical                            was performed, and patient medications and                            allergies were reviewed. The patient's tolerance of                            previous anesthesia was also reviewed. The risks                            and benefits of the procedure and the sedation                            options and risks were discussed with the patient.                            All questions were answered, and informed consent                            was obtained. Prior Anticoagulants: The patient has                            taken Xarelto (rivaroxaban), last dose was 2 days                            prior to procedure. ASA Grade Assessment: III - A                            patient with severe systemic disease. After                            reviewing the risks and benefits, the patient was                            deemed in satisfactory condition to undergo the  procedure.                           After obtaining informed consent, the endoscope was                            passed under direct vision. Throughout the                            procedure, the patient's blood pressure, pulse, and                            oxygen saturations were monitored continuously. The                            Endoscope was introduced through the mouth, and   advanced to the second part of duodenum. The upper                            GI endoscopy was accomplished without difficulty.                            The patient tolerated the procedure well. Scope In: Scope Out: Findings:                 Localized mucosal changes characterized by nodule,                            friability (with contact bleeding) and inflammation                            were found at the gastroesophageal junction. This                            was the area biopsied 4 weeks ago, when there was                            also severe reflux esophagitis. Biopsies were taken                            with a cold forceps for histology. (Jar 1)                           This nodule was arising from a tongue of                            salmon-colored mucosa, which was biopsied                            separately (Jar 2)                           The reflux esophagitis had healed since prior EGD.  Extrinsic compression on the stomach was found in                            the cardia (from a LapBand).                           The exam of the stomach was otherwise normal.                           The cardia and gastric fundus were normal on                            retroflexion.                           The examined duodenum was normal. Complications:            No immediate complications. Estimated Blood Loss:     Estimated blood loss was minimal. Impression:               - Friable nodule (with contact bleeding), inflamed                            mucosa in the esophagus. Biopsied.                           - Extrinsic compression in the cardia (LapBand).                           - Normal examined duodenum. Recommendation:           - Patient has a contact number available for                            emergencies. The signs and symptoms of potential                            delayed complications were discussed with the                             patient. Return to normal activities tomorrow.                            Written discharge instructions were provided to the                            patient.                           - Resume previous diet.                           - Resume Xarelto (rivaroxaban) at prior dose in 2                            days.                           -  Await pathology results.                           Decrease omeprazole to 40 mg once daily. Henry L. Loletha Carrow, MD 07/30/2018 10:36:23 AM This report has been signed electronically.

## 2018-07-30 NOTE — Progress Notes (Signed)
Report given to PACU, vss 

## 2018-07-30 NOTE — Progress Notes (Signed)
Called to room to assist during endoscopic procedure.  Patient ID and intended procedure confirmed with present staff. Received instructions for my participation in the procedure from the performing physician.  

## 2018-07-30 NOTE — Patient Instructions (Addendum)
RESUME YOUR XARELTO ON THE MORNING OF 08/01/18  Change Omeprazole dose to daily.  __________________________________________________________________________________  YOU HAD AN ENDOSCOPIC PROCEDURE TODAY AT THE [facility]:   Refer to the procedure report that was given to you for any specific questions about what was found during the examination.  If the procedure report does not answer your questions, please call your gastroenterologist to clarify.  If you requested that your care partner not be given the details of your procedure findings, then the procedure report has been included in a sealed envelope for you to review at your convenience later.  YOU SHOULD EXPECT: Some feelings of bloating in the abdomen. Passage of more gas than usual.  Walking can help get rid of the air that was put into your GI tract during the procedure and reduce the bloating. If you had a lower endoscopy (such as a colonoscopy or flexible sigmoidoscopy) you may notice spotting of blood in your stool or on the toilet paper. If you underwent a bowel prep for your procedure, you may not have a normal bowel movement for a few days.  Please Note:  You might notice some irritation and congestion in your nose or some drainage.  This is from the oxygen used during your procedure.  There is no need for concern and it should clear up in a day or so.  SYMPTOMS TO REPORT IMMEDIATELY:   Following upper endoscopy (EGD)  Vomiting of blood or coffee ground material  New chest pain or pain under the shoulder blades  Painful or persistently difficult swallowing  New shortness of breath  Fever of 100F or higher  Black, tarry-looking stools  For urgent or emergent issues, a gastroenterologist can be reached at any hour by calling [phone].   DIET:  We do recommend a small meal at first, but then you may proceed to your regular diet.  Drink plenty of fluids but you should avoid alcoholic beverages for 24  hours.  ACTIVITY:  You should plan to take it easy for the rest of today and you should NOT DRIVE or use heavy machinery until tomorrow (because of the sedation medicines used during the test).    FOLLOW UP: Our staff will call the number listed on your records the next business day following your procedure to check on you and address any questions or concerns that you may have regarding the information given to you following your procedure. If we do not reach you, we will leave a message.  However, if you are feeling well and you are not experiencing any problems, there is no need to return our call.  We will assume that you have returned to your regular daily activities without incident.  If any biopsies were taken you will be contacted by phone or by letter within the next 1-3 weeks.  Please call us at [phone] if you have not heard about the biopsies in 3 weeks.    SIGNATURES/CONFIDENTIALITY: You and/or your care partner have signed paperwork which will be entered into your electronic medical record.  These signatures attest to the fact that that the information above on your After Visit Summary has been reviewed and is understood.  Full responsibility of the confidentiality of this discharge information lies with you and/or your care-partner.

## 2018-07-30 NOTE — Progress Notes (Signed)
Pt's states no medical or surgical changes since previsit or office visit. 

## 2018-07-31 ENCOUNTER — Telehealth: Payer: Self-pay

## 2018-07-31 ENCOUNTER — Other Ambulatory Visit: Payer: Self-pay | Admitting: Pharmacy Technician

## 2018-07-31 ENCOUNTER — Telehealth: Payer: Self-pay | Admitting: *Deleted

## 2018-07-31 NOTE — Patient Outreach (Signed)
[facility]) Care Management  07/31/2018  Jeffrey Voth Nov 13, 1955 [id number]  Care coordination call placed to [employer] patient assistance in regards to patient's application for Zetia.  Spoke to [facility] who said that patient had been APPROVED 07/29/2027/-05/01/2019. Marva informed that there was no shipping information available at this time. She informed that typically in takes 2-3 business days for the pharmacy to receive the request, then another 7-10 business days for processing and another 3-5 days for the patient to receive the medication,  Will followup with patient in 14-21 business days to inquire if medication has been received.  Scotlynn Noyes P. Suren Payne, [facility] [phone]

## 2018-07-31 NOTE — Telephone Encounter (Signed)
  Follow up Call-  Call back number 07/30/2018 06/26/2018  Post procedure Call Back phone  # [phone]  Permission to leave phone message Yes Yes  Some recent data might be hidden     Patient questions:  Do you have a fever, pain , or abdominal swelling? No. Pain Score  0 *  Have you tolerated food without any problems? Yes.    Have you been able to return to your normal activities? Yes.    Do you have any questions about your discharge instructions: Diet   No. Medications  No. Follow up visit  No.  Do you have questions or concerns about your Care? No.  Actions: * If pain score is 4 or above: No action needed, pain <4.

## 2018-07-31 NOTE — Telephone Encounter (Signed)
Follow up call attempted, left message for pt to call if problems.

## 2018-08-06 ENCOUNTER — Telehealth: Payer: Self-pay

## 2018-08-06 NOTE — Telephone Encounter (Signed)
Virtual Visit Pre-Appointment Phone Call  Steps For Call:  1. Confirm consent - "In the setting of the current Covid19 crisis, you are scheduled for a (phone or video) visit with your provider on (date) at (time).  Just as we do with many in-office visits, in order for you to participate in this visit, we must obtain consent.  If you'd like, I can send this to your mychart (if signed up) or email for you to review.  Otherwise, I can obtain your verbal consent now.  All virtual visits are billed to your insurance company just like a normal visit would be.  By agreeing to a virtual visit, we'd like you to understand that the technology does not allow for your provider to perform an examination, and thus may limit your provider's ability to fully assess your condition.  Finally, though the technology is pretty good, we cannot assure that it will always work on either your or our end, and in the setting of a video visit, we may have to convert it to a phone-only visit.  In either situation, we cannot ensure that we have a secure connection.  Are you willing to proceed?"  2. Give patient instructions for WebEx download to smartphone as below if video visit  3. Advise patient to be prepared with any vital sign or heart rhythm information, their current medicines, and a piece of paper and pen handy for any instructions they may receive the day of their visit  4. Inform patient they will receive a phone call 15 minutes prior to their appointment time (may be from unknown caller ID) so they should be prepared to answer  5. Confirm that appointment type is correct in Epic appointment notes (video vs telephone)    TELEPHONE CALL NOTE  Theresa Harrington has been deemed a candidate for a follow-up tele-health visit to limit community exposure during the Covid-19 pandemic. I spoke with the patient via phone to ensure availability of phone/video source, confirm preferred email & phone number, and discuss  instructions and expectations.  I reminded Theresa Harrington to be prepared with any vital sign and/or heart rhythm information that could potentially be obtained via home monitoring, at the time of her visit. I reminded Theresa Harrington to expect a phone call at the time of her visit if her visit.  Did the patient verbally acknowledge consent to treatment? [place], [place] 08/06/2018 11:40 AM   DOWNLOADING THE [place]  - If Apple, go to [employer] and type in WebEx in the search bar. [employer], the blue/green circle. The app is free but as with any other app downloads, their phone may require them to verify saved payment information or Apple password. The patient does NOT have to create an account.  - If Android, ask patient to go to [employer] and type in WebEx in the search bar. [place] [employer], the blue/green circle. The app is free but as with any other app downloads, their phone may require them to verify saved payment information or Android password. The patient does NOT have to create an account.   CONSENT FOR TELE-HEALTH VISIT - PLEASE REVIEW  I hereby voluntarily request, consent and authorize [facility] and its employed or contracted physicians, physician assistants, nurse practitioners or other licensed health care professionals (the Practitioner), to provide me with telemedicine health care services (the "Services") as deemed necessary by the treating Practitioner. I acknowledge  and consent to receive the Services by the Practitioner via telemedicine. I understand that the telemedicine visit will involve communicating with the Practitioner through live audiovisual communication technology and the disclosure of certain medical information by electronic transmission. I acknowledge that I have been given the opportunity to request an in-person assessment or other available alternative prior to the telemedicine visit and am  voluntarily participating in the telemedicine visit.  I understand that I have the right to withhold or withdraw my consent to the use of telemedicine in the course of my care at any time, without affecting my right to future care or treatment, and that the Practitioner or I may terminate the telemedicine visit at any time. I understand that I have the right to inspect all information obtained and/or recorded in the course of the telemedicine visit and may receive copies of available information for a reasonable fee.  I understand that some of the potential risks of receiving the Services via telemedicine include:  [name] Delay or interruption in medical evaluation due to technological equipment failure or disruption; . Information transmitted may not be sufficient (e.g. poor resolution of images) to allow for appropriate medical decision making by the Practitioner; and/or  . In rare instances, security protocols could fail, causing a breach of personal health information.  Furthermore, I acknowledge that it is my responsibility to provide information about my medical history, conditions and care that is complete and accurate to the best of my ability. I acknowledge that Practitioner's advice, recommendations, and/or decision may be based on factors not within their control, such as incomplete or inaccurate data provided by me or distortions of diagnostic images or specimens that may result from electronic transmissions. I understand that the practice of medicine is not an exact science and that Practitioner makes no warranties or guarantees regarding treatment outcomes. I acknowledge that I will receive a copy of this consent concurrently upon execution via email to the email address I last provided but may also request a printed copy by calling the office of [facility].    I understand that my insurance will be billed for this visit.   I have read or had this consent read to me. . I understand the  contents of this consent, which adequately explains the benefits and risks of the Services being provided via telemedicine.  . I have been provided ample opportunity to ask questions regarding this consent and the Services and have had my questions answered to my satisfaction. . I give my informed consent for the services to be provided through the use of telemedicine in my medical care  By participating in this telemedicine visit I agree to the above.

## 2018-08-13 ENCOUNTER — Telehealth: Payer: PPO | Admitting: Cardiology

## 2018-08-13 ENCOUNTER — Other Ambulatory Visit: Payer: Self-pay

## 2018-08-13 ENCOUNTER — Telehealth: Payer: Self-pay | Admitting: Cardiology

## 2018-08-13 NOTE — Telephone Encounter (Signed)
When patient was called by Clifton James to be prepared for her virtual visit today, she cancelled her appt.

## 2018-08-13 NOTE — Telephone Encounter (Signed)
New Message    Pt is calling to confirm her Virtual appt today

## 2018-08-14 ENCOUNTER — Telehealth: Payer: Self-pay | Admitting: Internal Medicine

## 2018-08-14 ENCOUNTER — Other Ambulatory Visit: Payer: Self-pay | Admitting: Pharmacy Technician

## 2018-08-14 ENCOUNTER — Other Ambulatory Visit: Payer: PPO

## 2018-08-14 ENCOUNTER — Ambulatory Visit (INDEPENDENT_AMBULATORY_CARE_PROVIDER_SITE_OTHER): Payer: PPO | Admitting: Internal Medicine

## 2018-08-14 ENCOUNTER — Other Ambulatory Visit: Payer: Self-pay | Admitting: Pharmacist

## 2018-08-14 ENCOUNTER — Other Ambulatory Visit: Payer: Self-pay

## 2018-08-14 ENCOUNTER — Ambulatory Visit: Payer: PPO | Admitting: Internal Medicine

## 2018-08-14 ENCOUNTER — Encounter: Payer: Self-pay | Admitting: Internal Medicine

## 2018-08-14 VITALS — BP 132/80 | HR 78 | Temp 97.4°F | Ht 65.5 in | Wt 176.5 lb

## 2018-08-14 DIAGNOSIS — R300 Dysuria: Secondary | ICD-10-CM | POA: Diagnosis not present

## 2018-08-14 DIAGNOSIS — M545 Low back pain, unspecified: Secondary | ICD-10-CM | POA: Insufficient documentation

## 2018-08-14 DIAGNOSIS — M25511 Pain in right shoulder: Secondary | ICD-10-CM | POA: Diagnosis not present

## 2018-08-14 DIAGNOSIS — R1030 Lower abdominal pain, unspecified: Secondary | ICD-10-CM | POA: Diagnosis not present

## 2018-08-14 LAB — POC URINALSYSI DIPSTICK (AUTOMATED)
Bilirubin, UA: NEGATIVE
Blood, UA: POSITIVE
Glucose, UA: NEGATIVE
Ketones, UA: NEGATIVE
Nitrite, UA: NEGATIVE
Protein, UA: POSITIVE — AB
Spec Grav, UA: >=1.03 — AB (ref 1.010–1.025)
Urobilinogen, UA: 0.2 E.U./dL
pH, UA: 6 (ref 5.0–8.0)

## 2018-08-14 NOTE — Telephone Encounter (Signed)
Patient is having lower back and believes she has a UTI. She is unsure on if she can do a virtual visit. Patient is willing to come into office. Please advise.

## 2018-08-14 NOTE — Progress Notes (Signed)
Subjective:  Patient ID: Theresa Harrington, female    DOB: October 13, 1955  Age: 63 y.o. MRN: [id number]  CC: Back Pain   HPI Theresa Harrington presents for concerns about a several day history of nonradiating low back pain that she describes as intermittently aching and throbbing.  She is adequately controlling the pain with Tylenol.  She denies paresthesias in her lower extremities.  She denies any recent trauma or injury.  She is concerned it might be related to a urinary tract infection.  Outpatient Medications Prior to Visit  Medication Sig Dispense Refill  . diltiazem (CARTIA XT) 120 MG 24 hr capsule Take 1 capsule (120 mg total) by mouth daily. 90 capsule 2  . Insulin Glargine (BASAGLAR KWIKPEN) 100 UNIT/ML SOPN Inject 0.8 mLs (80 Units total) into the skin daily. 9 mL 1  . insulin lispro (HUMALOG) 100 UNIT/ML injection Inject 0.2-0.24 mLs (20-24 Units total) into the skin 3 (three) times daily before meals. (Patient taking differently: Inject 16-20 Units into the skin 3 (three) times daily before meals. ) 65 mL 1  . Insulin Pen Needle (PEN NEEDLES) 32G X 4 MM MISC Inject 1 pen as directed 4 (four) times daily. Use to inject levemir or novolog as directed. 400 each 3  . latanoprost (XALATAN) 0.005 % ophthalmic solution Place 1 drop into both eyes at bedtime.     [name] losartan-hydrochlorothiazide (HYZAAR) 100-12.5 MG tablet TAKE 1 TABLET BY MOUTH ONCE DAILY 90 tablet 0  . magnesium oxide (MAG-OX) 400 (241.3 Mg) MG tablet TAKE 1 TABLET BY MOUTH ONCE DAILY 90 tablet 1  . metFORMIN (GLUCOPHAGE) 1000 MG tablet TAKE 1 TABLET BY MOUTH TWICE DAILY WITH MEALS 180 tablet 1  . omeprazole (PRILOSEC) 40 MG capsule Take 1 capsule (40 mg total) by mouth daily. 90 capsule 3  . pravastatin (PRAVACHOL) 40 MG tablet Take 1 tablet (40 mg total) by mouth daily. 90 tablet 3  . rivaroxaban (XARELTO) 20 MG TABS tablet Take 1 tablet (20 mg total) by mouth daily with supper. 90 tablet 3  . Thiamine HCl (VITAMIN B1) 100 MG TABS Take  1 tablet by mouth once daily 90 tablet 0  . TRULICITY 7.89 FY/1.0FB SOPN PLEASE START INJECTING 0.75MG  UNDER THE SKIN WEEKLY 4 mL 0   No facility-administered medications prior to visit.     ROS Review of Systems  Constitutional: Negative for appetite change, chills, fatigue and fever.  HENT: Negative.   Respiratory: Negative.  Negative for cough, chest tightness, shortness of breath and wheezing.   Cardiovascular: Negative for chest pain, palpitations and leg swelling.  Gastrointestinal: Negative for abdominal pain, diarrhea, nausea and vomiting.  Genitourinary: Positive for dysuria. Negative for difficulty urinating, flank pain, hematuria, pelvic pain and urgency.  Musculoskeletal: Positive for back pain.  Skin: Negative.  Negative for rash.  Neurological: Negative.  Negative for dizziness, weakness, light-headedness and headaches.  Hematological: Negative for adenopathy. Does not bruise/bleed easily.  Psychiatric/Behavioral: Negative.     Objective:  BP 132/80 (BP Location: Left Arm, Patient Position: Sitting, Cuff Size: Normal)   Pulse 78   Temp (!) 97.4 F (36.3 C) (Oral)   Ht 5' 5.5" (1.664 m)   Wt 176 lb 8 oz (80.1 kg)   LMP  (LMP Unknown)   SpO2 99%   BMI 28.92 kg/m   BP Readings from Last 3 Encounters:  08/14/18 132/80  07/30/18 (!) 142/66  06/26/18 (!) 149/65    Wt Readings from Last 3 Encounters:  08/14/18 176 lb 8  oz (80.1 kg)  07/30/18 179 lb (81.2 kg)  07/22/18 179 lb (81.2 kg)    Physical Exam Vitals signs reviewed.  Constitutional:      General: She is not in acute distress.    Appearance: She is not ill-appearing, toxic-appearing or diaphoretic.  HENT:     Nose: Nose normal. No congestion.     Mouth/Throat:     Mouth: Mucous membranes are moist.     Pharynx: Oropharynx is clear. No oropharyngeal exudate or posterior oropharyngeal erythema.  Eyes:     Conjunctiva/sclera: Conjunctivae normal.  Neck:     Musculoskeletal: Normal range of motion  and neck supple. No muscular tenderness.  Cardiovascular:     Rate and Rhythm: Normal rate and regular rhythm.     Heart sounds: No murmur.  Pulmonary:     Effort: Pulmonary effort is normal. No respiratory distress.     Breath sounds: Normal breath sounds. No stridor. No wheezing, rhonchi or rales.  Abdominal:     General: Bowel sounds are normal.     Palpations: There is no mass.     Tenderness: There is no abdominal tenderness. There is no guarding.  Musculoskeletal: Normal range of motion.        General: No swelling.     Lumbar back: Normal. She exhibits normal range of motion, no tenderness, no bony tenderness, no swelling, no edema, no deformity, no pain and no spasm.     Right lower leg: No edema.     Left lower leg: No edema.  Lymphadenopathy:     Cervical: No cervical adenopathy.  Skin:    General: Skin is warm and dry.  Neurological:     General: No focal deficit present.     Mental Status: She is oriented to person, place, and time. Mental status is at baseline.     Motor: Motor function is intact. No weakness, tremor, atrophy or abnormal muscle tone.     Coordination: Coordination is intact.     Gait: Gait is intact.     Deep Tendon Reflexes: Reflexes are normal and symmetric.     Comments: Neg SLR in BLE     Lab Results  Component Value Date   WBC 9.8 03/05/2018   HGB 12.7 03/05/2018   HCT 37.9 03/05/2018   PLT 278.0 03/05/2018   GLUCOSE 261 (H) 03/05/2018   CHOL 148 03/05/2018   TRIG 166.0 (H) 03/05/2018   HDL 51.50 03/05/2018   LDLDIRECT 160.2 03/01/2010   LDLCALC 63 03/05/2018   ALT 10 06/05/2016   AST 10 06/05/2016   NA 139 03/05/2018   K 3.6 03/05/2018   CL 101 03/05/2018   CREATININE 0.79 03/05/2018   BUN 15 03/05/2018   CO2 28 03/05/2018   TSH 0.24 (L) 10/31/2017   INR 1.05 07/16/2014   HGBA1C 8.3 (H) 07/22/2018   MICROALBUR 6.3 (H) 03/05/2018   Results for orders placed or performed in visit on 08/14/18 (from the past 1 hour(s))  POCT  Urinalysis Dipstick (Automated)   Collection Time: 08/14/18  2:16 PM  Result Value Ref Range   Color, UA straw    Clarity, UA cloudy    Glucose, UA Negative Negative   Bilirubin, UA Negative    Ketones, UA Negative    Spec Grav, UA >=1.030 (A) 1.010 - 1.025   Blood, UA POSITIVE    pH, UA 6.0 5.0 - 8.0   Protein, UA Positive (A) Negative   Urobilinogen, UA 0.2 0.2 or 1.0 E.U./dL  Nitrite, UA Negative    Leukocytes, UA Small (1+) (A) Negative      Assessment & Plan:   Theresa Harrington was seen today for back pain.  Diagnoses and all orders for this visit:  Midline low back pain without sciatica, unspecified chronicity-she has mechanical low back pain with no radicular symptoms and she is neurologically intact.  She is getting symptom relief with Tylenol so of asked her to continue taking that.  I do not see an indication for plain films at this time. -     POCT Urinalysis Dipstick (Automated)  Dysuria - Her urinalysis is abnormal.  Urine culture is pending.  I will treat her for UTI if the urine culture is positive. -     POCT Urinalysis Dipstick (Automated) -     CULTURE, URINE COMPREHENSIVE; Future   I am having Theresa Harrington maintain her Pen Needles, magnesium oxide, latanoprost, diltiazem, rivaroxaban, pravastatin, metFORMIN, insulin lispro, Trulicity, losartan-hydrochlorothiazide, Basaglar KwikPen, Vitamin B1, and omeprazole.  No orders of the defined types were placed in this encounter.    Follow-up: Return if symptoms worsen or fail to improve.  Scarlette Calico, MD

## 2018-08-14 NOTE — Progress Notes (Signed)
Patient cancelled her appt.

## 2018-08-14 NOTE — Telephone Encounter (Signed)
LVM for patient to call back to make an appointment in office.

## 2018-08-14 NOTE — Patient Outreach (Signed)
[facility]) Care Management  08/14/2018  Chakia Counts [birth year] [id number]  Successful outreach call placed to patient in regards to [employer] patient assistance for Zetia.  Spoke to patient, HIPAA identifiers verified.  Patient informed that she received 90 days supply of Zetia. Informed patient when she gets around a 2-2.5 week supply of medication left to call the pharmacy number on the bottle of medication that was shipped to her from [facility]. Patient verbalized understanding.  Inquire if patient had any other questions or concerns as relates to patient assistance to which she informed that she did not. Confirmed patient had our name and number for future reference.  Will route not to [place] for case closure and remove myself from care team as patient assistance has been completed.  Arica Bevilacqua P. Ruhee Enck, [place] [facility] [phone]

## 2018-08-14 NOTE — Telephone Encounter (Signed)
Ask her to come in

## 2018-08-14 NOTE — Patient Instructions (Signed)
Acute Back Pain, Adult  Acute back pain is sudden and usually short-lived. It is often caused by an injury to the muscles and tissues in the back. The injury may result from:   A muscle or ligament getting overstretched or torn (strained). Ligaments are tissues that connect bones to each other. Lifting something improperly can cause a back strain.   Wear and tear (degeneration) of the spinal disks. Spinal disks are circular tissue that provides cushioning between the bones of the spine (vertebrae).   Twisting motions, such as while playing sports or doing yard work.   A hit to the back.   Arthritis.  You may have a physical exam, lab tests, and imaging tests to find the cause of your pain. Acute back pain usually goes away with rest and home care.  Follow these instructions at home:  Managing pain, stiffness, and swelling   Take over-the-counter and prescription medicines only as told by your health care provider.   Your health care provider may recommend applying ice during the first 24-48 hours after your pain starts. To do this:  ? Put ice in a plastic bag.  ? Place a towel between your skin and the bag.  ? Leave the ice on for 20 minutes, 2-3 times a day.   If directed, apply heat to the affected area as often as told by your health care provider. Use the heat source that your health care provider recommends, such as a moist heat pack or a heating pad.  ? Place a towel between your skin and the heat source.  ? Leave the heat on for 20-30 minutes.  ? Remove the heat if your skin turns bright red. This is especially important if you are unable to feel pain, heat, or cold. You have a greater risk of getting burned.  Activity     Do not stay in bed. Staying in bed for more than 1-2 days can delay your recovery.   Sit up and stand up straight. Avoid leaning forward when you sit, or hunching over when you stand.  ? If you work at a desk, sit close to it so you do not need to lean over. Keep your chin tucked  in. Keep your neck drawn back, and keep your elbows bent at a right angle. Your arms should look like the letter "L."  ? Sit high and close to the steering wheel when you drive. Add lower back (lumbar) support to your car seat, if needed.   Take short walks on even surfaces as soon as you are able. Try to increase the length of time you walk each day.   Do not sit, drive, or stand in one place for more than 30 minutes at a time. Sitting or standing for long periods of time can put stress on your back.   Do not drive or use heavy machinery while taking prescription pain medicine.   Use proper lifting techniques. When you bend and lift, use positions that put less stress on your back:  ? Bend your knees.  ? Keep the load close to your body.  ? Avoid twisting.   Exercise regularly as told by your health care provider. Exercising helps your back heal faster and helps prevent back injuries by keeping muscles strong and flexible.   Work with a physical therapist to make a safe exercise program, as recommended by your health care provider. Do any exercises as told by your physical therapist.  Lifestyle   Maintain   a healthy weight. Extra weight puts stress on your back and makes it difficult to have good posture.   Avoid activities or situations that make you feel anxious or stressed. Stress and anxiety increase muscle tension and can make back pain worse. Learn ways to manage anxiety and stress, such as through exercise.  General instructions   Sleep on a firm mattress in a comfortable position. Try lying on your side with your knees slightly bent. If you lie on your back, put a pillow under your knees.   Follow your treatment plan as told by your health care provider. This may include:  ? Cognitive or behavioral therapy.  ? Acupuncture or massage therapy.  ? Meditation or yoga.  Contact a health care provider if:   You have pain that is not relieved with rest or medicine.   You have increasing pain going down  into your legs or buttocks.   Your pain does not improve after 2 weeks.   You have pain at night.   You lose weight without trying.   You have a fever or chills.  Get help right away if:   You develop new bowel or bladder control problems.   You have unusual weakness or numbness in your arms or legs.   You develop nausea or vomiting.   You develop abdominal pain.   You feel faint.  Summary   Acute back pain is sudden and usually short-lived.   Use proper lifting techniques. When you bend and lift, use positions that put less stress on your back.   Take over-the-counter and prescription medicines and apply heat or ice as directed by your health care provider.  This information is not intended to replace advice given to you by your health care provider. Make sure you discuss any questions you have with your health care provider.  Document Released: 04/17/2005 Document Revised: 11/22/2017 Document Reviewed: 11/29/2016  Elsevier Interactive Patient Education  2019 Elsevier Inc.

## 2018-08-14 NOTE — Patient Outreach (Signed)
[facility]) Care Management  08/14/2018  Gerrica Cygan 11-May-1955 [id number]  Receive an Suanne Marker message from [place] letting me know that she spoke with Ms. Buckalew today in regards to [employer] patient assistance for [employer] and confirmed that patient received 90 days supply of Zetia, understands how to order future refills and denies any other questions or concerns related to patient assistance.  PLAN  1) Will close pharmacy episode at this time.  2) Will send an InBasket message to patient's PCP to let him know of case closure.  Harlow Asa, PharmD, [place] Management [phone]

## 2018-08-14 NOTE — Telephone Encounter (Signed)
Appointment has been made for today at 4pm.  

## 2018-08-15 ENCOUNTER — Ambulatory Visit: Payer: PPO | Admitting: Cardiology

## 2018-08-16 LAB — CULTURE, URINE COMPREHENSIVE
MICRO NUMBER:: 397426
SPECIMEN QUALITY:: ADEQUATE

## 2018-08-27 DIAGNOSIS — S98132A Complete traumatic amputation of one left lesser toe, initial encounter: Secondary | ICD-10-CM | POA: Diagnosis not present

## 2018-08-27 DIAGNOSIS — E1151 Type 2 diabetes mellitus with diabetic peripheral angiopathy without gangrene: Secondary | ICD-10-CM | POA: Diagnosis not present

## 2018-08-27 DIAGNOSIS — S90425A Blister (nonthermal), left lesser toe(s), initial encounter: Secondary | ICD-10-CM | POA: Diagnosis not present

## 2018-08-27 DIAGNOSIS — Z8631 Personal history of diabetic foot ulcer: Secondary | ICD-10-CM | POA: Diagnosis not present

## 2018-08-29 ENCOUNTER — Other Ambulatory Visit: Payer: Self-pay | Admitting: Internal Medicine

## 2018-08-29 DIAGNOSIS — E1151 Type 2 diabetes mellitus with diabetic peripheral angiopathy without gangrene: Secondary | ICD-10-CM | POA: Diagnosis not present

## 2018-08-29 DIAGNOSIS — Z8631 Personal history of diabetic foot ulcer: Secondary | ICD-10-CM | POA: Diagnosis not present

## 2018-08-29 DIAGNOSIS — S90421A Blister (nonthermal), right great toe, initial encounter: Secondary | ICD-10-CM | POA: Diagnosis not present

## 2018-09-05 DIAGNOSIS — S90421D Blister (nonthermal), right great toe, subsequent encounter: Secondary | ICD-10-CM | POA: Diagnosis not present

## 2018-09-05 DIAGNOSIS — E1151 Type 2 diabetes mellitus with diabetic peripheral angiopathy without gangrene: Secondary | ICD-10-CM | POA: Diagnosis not present

## 2018-09-09 ENCOUNTER — Telehealth: Payer: Self-pay | Admitting: *Deleted

## 2018-09-09 NOTE — Telephone Encounter (Signed)
TELEPHONE CALL NOTE  This patient has been deemed a candidate for follow-up tele-health visit to limit community exposure during the Covid-19 pandemic. I spoke with the patient via phone to discuss instructions. This has been outlined on the patient's AVS (dotphrase: hcevisitinfo). The patient was advised to review the section on consent for treatment as well. The patient will receive a phone call 2-3 days prior to their E-Visit at which time consent will be verbally confirmed.   A Virtual Office Visit appointment type has been scheduled for PHONE CALL  with JILL MCDANIEL, NP2, with "VIDEO" or "TELEPHONE" in the appointment notes - patient prefers PHONE type.  I have either confirmed the patient is active in MyChart or offered to send sign-up link to phone/email via Mychart icon beside patient's photo.PT DECLINED MY CHART  Theresa Harrington, [place] 09/09/2018 10:35 AM  YOUR CARDIOLOGY TEAM HAS ARRANGED FOR AN E-VISIT FOR YOUR APPOINTMENT - PLEASE REVIEW IMPORTANT INFORMATION BELOW SEVERAL DAYS PRIOR TO YOUR APPOINTMENT  Due to the recent COVID-19 pandemic, we are transitioning in-person office visits to tele-medicine visits in an effort to decrease unnecessary exposure to our patients, their families, and staff. These visits are billed to your insurance just like a normal visit is. We also encourage you to sign up for MyChart if you have not already done so. You will need a smartphone if possible. For patients that do not have this, we can still complete the visit using a regular telephone but do prefer a smartphone to enable video when possible. You may have a family member that lives with you that can help. If possible, we also ask that you have a blood pressure cuff and scale at home to measure your blood pressure, heart rate and weight prior to your scheduled appointment. Patients with clinical needs that need an in-person evaluation and testing will still be able to come to the office if absolutely  necessary. If you have any questions, feel free to call our office.     YOUR PROVIDER WILL BE USING THE FOLLOWING PLATFORM TO COMPLETE YOUR VISIT: PHONE   IF USING MYCHART - How to Download the MyChart App to Your SmartPhone   - If Apple, go to [employer] and type in MyChart in the search bar and download the app. If Android, ask patient to go to [employer] and type in [place] in the search bar and download the app. The app is free but as with any other app downloads, your phone may require you to verify saved payment information or Apple/Android password.  - You will need to then log into the app with your MyChart username and password, and select [facility] as your healthcare provider to link the account.  - When it is time for your visit, go to the MyChart app, find appointments, and click Begin Video Visit. Be sure to Select Allow for your device to access the Microphone and Camera for your visit. You will then be connected, and your provider will be with you shortly.  **If you have any issues connecting or need assistance, please contact MyChart service desk [phone])**  **If using a computer, in order to ensure the best quality for your visit, you will need to use either of the following Internet Browsers: Insurance underwriter or Microsoft Edge**   IF USING DOXIMITY or DOXY.ME - The staff will give you instructions on receiving your link to join the meeting the day of your visit.  2-3 DAYS BEFORE YOUR APPOINTMENT  You will receive a telephone call from one of our [place] team members - your caller ID may say "Unknown caller." If this is a video visit, we will walk you through how to get the video launched on your phone. We will remind you check your blood pressure, heart rate and weight prior to your scheduled appointment. If you have an Apple Watch or Kardia, please upload any pertinent ECG strips the day before or morning of your appointment to [facility]. Our  staff will also make sure you have reviewed the consent and agree to move forward with your scheduled tele-health visit.     THE DAY OF YOUR APPOINTMENT  Approximately 15 minutes prior to your scheduled appointment, you will receive a telephone call from one of [place] team - your caller ID may say "Unknown caller."  Our staff will confirm medications, vital signs for the day and any symptoms you may be experiencing. Please have this information available prior to the time of visit start. It may also be helpful for you to have a pad of paper and pen handy for any instructions given during your visit. They will also walk you through joining the smartphone meeting if this is a video visit.    CONSENT FOR TELE-HEALTH VISIT - PLEASE REVIEW  I hereby voluntarily request, consent and authorize [facility] and its employed or contracted physicians, physician assistants, nurse practitioners or other licensed health care professionals (the Practitioner), to provide me with telemedicine health care services (the Services") as deemed necessary by the treating Practitioner. I acknowledge and consent to receive the Services by the Practitioner via telemedicine. I understand that the telemedicine visit will involve communicating with the Practitioner through live audiovisual communication technology and the disclosure of certain medical information by electronic transmission. I acknowledge that I have been given the opportunity to request an in-person assessment or other available alternative prior to the telemedicine visit and am voluntarily participating in the telemedicine visit.  I understand that I have the right to withhold or withdraw my consent to the use of telemedicine in the course of my care at any time, without affecting my right to future care or treatment, and that the Practitioner or I may terminate the telemedicine visit at any time. I understand that I have the right to inspect all  information obtained and/or recorded in the course of the telemedicine visit and may receive copies of available information for a reasonable fee.  I understand that some of the potential risks of receiving the Services via telemedicine include:   Delay or interruption in medical evaluation due to technological equipment failure or disruption;  Information transmitted may not be sufficient (e.g. poor resolution of images) to allow for appropriate medical decision making by the Practitioner; and/or   In rare instances, security protocols could fail, causing a breach of personal health information.  Furthermore, I acknowledge that it is my responsibility to provide information about my medical history, conditions and care that is complete and accurate to the best of my ability. I acknowledge that Practitioner's advice, recommendations, and/or decision may be based on factors not within their control, such as incomplete or inaccurate data provided by me or distortions of diagnostic images or specimens that may result from electronic transmissions. I understand that the practice of medicine is not an exact science and that Practitioner makes no warranties or guarantees regarding treatment outcomes. I acknowledge that I will receive a copy of this consent concurrently  upon execution via email to the email address I last provided but may also request a printed copy by calling the office of [facility].    I understand that my insurance will be billed for this visit.   I have read or had this consent read to me.  I understand the contents of this consent, which adequately explains the benefits and risks of the Services being provided via telemedicine.   I have been provided ample opportunity to ask questions regarding this consent and the Services and have had my questions answered to my satisfaction.  I give my informed consent for the services to be provided through the use of telemedicine in my  medical care  By participating in this telemedicine visit I agree to the above.     Virtual Visit Pre-Appointment Phone Call  "(Name), I am calling you today to discuss your upcoming appointment. We are currently trying to limit exposure to the virus that causes COVID-19 by seeing patients at home rather than in the office."  1. "What is the BEST phone number to call the day of the visit?" - include this in appointment notes  2. Do you have or have access to (through a family member/friend) a smartphone with video capability that we can use for your visit?" a. If yes - list this number in appt notes as cell (if different from BEST phone #) and list the appointment type as a VIDEO visit in appointment notes b. If no - list the appointment type as a PHONE visit in appointment notes  3. Confirm consent - "In the setting of the current Covid19 crisis, you are scheduled for a (phone or video) visit with your provider on (date) at (time).  Just as we do with many in-office visits, in order for you to participate in this visit, we must obtain consent.  If you'd like, I can send this to your mychart (if signed up) or email for you to review.  Otherwise, I can obtain your verbal consent now.  All virtual visits are billed to your insurance company just like a normal visit would be.  By agreeing to a virtual visit, we'd like you to understand that the technology does not allow for your provider to perform an examination, and thus may limit your provider's ability to fully assess your condition. If your provider identifies any concerns that need to be evaluated in person, we will make arrangements to do so.  Finally, though the technology is pretty good, we cannot assure that it will always work on either your or our end, and in the setting of a video visit, we may have to convert it to a phone-only visit.  In either situation, we cannot ensure that we have a secure connection.  Are you willing to proceed?"  STAFF: Did the patient verbally acknowledge consent to telehealth visit? Document YES/NO here: YES  4. Advise patient to be prepared - "Two hours prior to your appointment, go ahead and check your blood pressure, pulse, oxygen saturation, and your weight (if you have the equipment to check those) and write them all down. When your visit starts, your provider will ask you for this information. If you have an Apple Watch or Kardia device, please plan to have heart rate information ready on the day of your appointment. Please have a pen and paper handy nearby the day of the visit as well."  5. Give patient instructions for MyChart download to smartphone OR Doximity/Doxy.me as below  if video visit (depending on what platform provider is using)  6. Inform patient they will receive a phone call 15 minutes prior to their appointment time (may be from unknown caller ID) so they should be prepared to answer    TELEPHONE CALL NOTE  Theresa Harrington has been deemed a candidate for a follow-up tele-health visit to limit community exposure during the Covid-19 pandemic. I spoke with the patient via phone to ensure availability of phone/video source, confirm preferred email & phone number, and discuss instructions and expectations.  I reminded Theresa Harrington to be prepared with any vital sign and/or heart rhythm information that could potentially be obtained via home monitoring, at the time of her visit. I reminded Theresa Harrington to expect a phone call prior to her visit.  Theresa Harrington, [place] 09/09/2018 10:41 AM   INSTRUCTIONS FOR DOWNLOADING THE MYCHART APP TO SMARTPHONE  - The patient must first make sure to have activated MyChart and know their login information - If Apple, go to [employer] and type in MyChart in the search bar and download the app. If Android, ask patient to go to [employer] and type in [place] in the search bar and download the app. The app is free but as with any other app downloads, their  phone may require them to verify saved payment information or Apple/Android password.  - The patient will need to then log into the app with their MyChart username and password, and select [place] as their healthcare provider to link the account. When it is time for your visit, go to the MyChart app, find appointments, and click Begin Video Visit. Be sure to Select Allow for your device to access the Microphone and Camera for your visit. You will then be connected, and your provider will be with you shortly.  **If they have any issues connecting, or need assistance please contact MyChart service desk [phone])**  **If using a computer, in order to ensure the best quality for their visit they will need to use either of the following Internet Browsers: [employer], or Google Chrome**  IF USING DOXIMITY or DOXY.ME - The patient will receive a link just prior to their visit by text.     FULL LENGTH CONSENT FOR TELE-HEALTH VISIT   I hereby voluntarily request, consent and authorize [facility] and its employed or contracted physicians, physician assistants, nurse practitioners or other licensed health care professionals (the Practitioner), to provide me with telemedicine health care services (the Services") as deemed necessary by the treating Practitioner. I acknowledge and consent to receive the Services by the Practitioner via telemedicine. I understand that the telemedicine visit will involve communicating with the Practitioner through live audiovisual communication technology and the disclosure of certain medical information by electronic transmission. I acknowledge that I have been given the opportunity to request an in-person assessment or other available alternative prior to the telemedicine visit and am voluntarily participating in the telemedicine visit.  I understand that I have the right to withhold or withdraw my consent to the use of telemedicine in the course of  my care at any time, without affecting my right to future care or treatment, and that the Practitioner or I may terminate the telemedicine visit at any time. I understand that I have the right to inspect all information obtained and/or recorded in the course of the telemedicine visit and may receive copies of available information for a reasonable fee.  I understand that some of the potential  risks of receiving the Services via telemedicine include:   Delay or interruption in medical evaluation due to technological equipment failure or disruption;  Information transmitted may not be sufficient (e.g. poor resolution of images) to allow for appropriate medical decision making by the Practitioner; and/or   In rare instances, security protocols could fail, causing a breach of personal health information.  Furthermore, I acknowledge that it is my responsibility to provide information about my medical history, conditions and care that is complete and accurate to the best of my ability. I acknowledge that Practitioner's advice, recommendations, and/or decision may be based on factors not within their control, such as incomplete or inaccurate data provided by me or distortions of diagnostic images or specimens that may result from electronic transmissions. I understand that the practice of medicine is not an exact science and that Practitioner makes no warranties or guarantees regarding treatment outcomes. I acknowledge that I will receive a copy of this consent concurrently upon execution via email to the email address I last provided but may also request a printed copy by calling the office of [facility].    I understand that my insurance will be billed for this visit.   I have read or had this consent read to me.  I understand the contents of this consent, which adequately explains the benefits and risks of the Services being provided via telemedicine.   I have been provided ample opportunity to ask  questions regarding this consent and the Services and have had my questions answered to my satisfaction.  I give my informed consent for the services to be provided through the use of telemedicine in my medical care  By participating in this telemedicine visit I agree to the above.

## 2018-09-09 NOTE — Progress Notes (Signed)
Virtual Visit via Video Note   This visit type was conducted due to national recommendations for restrictions regarding the COVID-19 Pandemic (e.g. social distancing) in an effort to limit this patient's exposure and mitigate transmission in our community.  Due to her co-morbid illnesses, this patient is at least at moderate risk for complications without adequate follow up.  This format is felt to be most appropriate for this patient at this time.  All issues noted in this document were discussed and addressed.  A limited physical exam was performed with this format.  Please refer to the patient's chart for her consent to telehealth for [facility].   Date:  09/10/2018   ID:  Theresa Harrington, DOB [birth year], MRN [id number]  Patient Location: Home Provider Location: Home  PCP:  [name], MD  Cardiologist:  Dr. Marlou Porch, MD   Evaluation Performed:  Follow-Up Visit  Chief Complaint: Follow-up for atrial fibrillation, seen for Dr. Marlou Porch  History of Present Illness:    Theresa Harrington is a 63 y.o. female with a prior history of paroxysmal atrial fibrillation on chronic anticoagulation with Xarelto, DM 2, peripheral vascular disease s/p femoropopliteal bypass 06/2014 and hypertension.  Theresa Harrington was last seen by Dr. Marlou Porch on 07/31/2017 in follow-up without specific complaints.  She was initially seen at the request of Dr. [name] for atrial fibrillation and was placed on Xarelto. Her clopidogrel (was on secondary to femoropopliteal bypass) was stopped to avoid excessive bleeding complications.  She was noted to be tolerating her medications well without s/s of bleeding.  I believe she was supposed to be seen 1 month after however this never occurred.  Today I am seeing Theresa Harrington via telephone visit.  She is without specific complaints.  Her BP is markedly elevated however she reports she has been helping her 64-year-old grandson with his math homework.  Her repeat blood pressures during our visit  were improved however still moderately elevated.  May need further medication titration.  She states she will have mild, intermittent palpitations, only occurring 2-3 times in the last 6 months.  She denies chest pain, shortness of breath, LE swelling, orthopnea, PND, dizziness or syncope.  She reports complete medication compliance with Xarelto.  No reports of acute blood in stool or urine.  She continues to follow with [facility] for her history of PVD although she has not seen them recently secondary to COVID-19.  She follows with her PCP closely for her diabetes control.  Hemoglobin A1c continues to be elevated at 8.3 from 07/22/2018 in which her PCP placed her on injectable medications that she has not yet started (using old medicine first).   The patient does not have symptoms concerning for COVID-19 infection (fever, chills, cough, or new shortness of breath).   Past Medical History:  Diagnosis Date  . Allergy   . Anemia   . Anxiety    Pt. denies  . Asymptomatic cholelithiasis   . Atherosclerosis of aorta ([place])   . Cataract   . Clotting disorder ([place])   . GERD (gastroesophageal reflux disease)   . Hyperlipidemia   . Hypertension   . Hypothyroidism    no meds  . PAD (peripheral artery disease) ([place])   . Peripheral arterial occlusive disease (H[facility])    lower extremities  . Pneumonia   . PONV (postoperative nausea and vomiting)   . Right ureteral stone   . Type 2 diabetes mellitus ([place])   . Wears glasses    Past Surgical History:  Procedure Laterality Date  . AMPUTATION Left 07/16/2014   Procedure: LEFT SECOND TOE AMPUTATION;  Surgeon: Serafina Mitchell, MD;  Location: [facility];  Service: Vascular;  Laterality: Left;  With Nerve block  . AUGMENTATION MAMMAPLASTY    . BELPHAROPTOSIS REPAIR     eyelid lift  . COMBINED AUGMENTATION MAMMAPLASTY AND ABDOMINOPLASTY  2009   W/  BILATERAL  THIGH LIFT  . CYSTO/  RIGHT URETERAL STENT PLACEMENT  12-30-2010  . CYSTOSCOPY WITH RETROGRADE PYELOGRAM,  URETEROSCOPY AND STENT PLACEMENT Right 10/07/2013   Procedure: CYSTOSCOPY WITH RETROGRADE PYELOGRAM, right URETEROSCOPY AND STENT PLACEMENT, stone extraction;  Surgeon: Arvil Persons, MD;  Location: [facility];  Service: Urology;  Laterality: Right;  . CYSTOSCOPY WITH STENT PLACEMENT Right 06/04/2013   Procedure: CYSTOSCOPY WITH STENT PLACEMENT;  Surgeon: Franchot Gallo, MD;  Location: [facility];  Service: Urology;  Laterality: Right;  . DILATATION & CURETTAGE/HYSTEROSCOPY WITH MYOSURE N/A 04/27/2015   Procedure: DILATATION & CURETTAGE/HYSTEROSCOPY WITH MYOSURE;  Surgeon: Terrance Mass, MD;  Location: [facility];  Service: Gynecology;  Laterality: N/A;  . ENDARTERECTOMY FEMORAL Left 06/08/2014   Procedure: Left Leg Common Femoral and External Iliac  Endartarectomy with patch Angioplasty;  Surgeon: Rosetta Posner, MD;  Location: [facility];  Service: Vascular;  Laterality: Left;  . EXTRACORPOREAL SHOCK WAVE LITHOTRIPSY Right 08-04-2013//   06-23-2013//   01-16-2011  . FEMORAL-POPLITEAL BYPASS GRAFT Left 06/08/2014   Procedure: Left Leg Femoral -Popliteal Bypass Graft;  Surgeon: Rosetta Posner, MD;  Location: [place];  Service: Vascular;  Laterality: Left;  . FEMORAL-POPLITEAL BYPASS GRAFT Left 06/09/2014   Procedure: Left Femoral and Popliteal Exposure; Left Femoral to Anterior Tibial Bypass Graft using Propaten 75m by 80cm Goretex Graft; Left Tibial Endarterectomy; Left Femoraland Popliteal Thrombectomy ;  Surgeon: VSerafina Mitchell MD;  Location[place] [facility]  Service: Vascular;  Laterality: Left;  . HOLMIUM LASER APPLICATION Right 61/09/1094  Procedure: HOLMIUM LASER APPLICATION;  Surgeon: MArvil Persons MD;  [facility]  Service: Urology;  Laterality: Right;  . KIDNEY STONE SURGERY  April 2015   1-2 stones  . LAPAROSCOPIC GASTRIC BANDING  05-29-2005  . LITHOTRIPSY  2-3 times  . LOWER EXTREMITY ANGIOGRAM N/A 06/04/2014   Procedure: LOWER EXTREMITY ANGIOGRAM;  Surgeon: VSerafina Mitchell MD;   [facility];  Service: Cardiovascular;  Laterality: N/A;  . ORIF FIFTH METACARPAL [place] RIGHT HAND  04-21-2002  . REVISION AND RE-SITING LAP-BAND PORT  04-08-2010   W/  UPPER EGD  . RIGHT KNEE PATELLECTOMY W/ REPAIR OF EXTENSOR MECHANISM  04-14-2002  . Toenail removed Left Jan. 21, 2016   2nd toenail-  Dr. PBarkley Bruns   Current Meds  Medication Sig  . diltiazem (CARTIA XT) 120 MG 24 hr capsule Take 1 capsule (120 mg total) by mouth daily.  .[name] Kitchenezetimibe (ZETIA) 10 MG tablet Take 10 mg by mouth daily.  . Insulin Glargine (BASAGLAR KWIKPEN) 100 UNIT/ML SOPN Inject 0.8 mLs (80 Units total) into the skin daily.  . insulin lispro (HUMALOG) 100 UNIT/ML injection Inject 16-20 Units into the skin 3 (three) times daily before meals.  . Insulin Pen Needle (PEN NEEDLES) 32G X 4 MM MISC Inject 1 pen as directed 4 (four) times daily. Use to inject levemir or novolog as directed.  . latanoprost (XALATAN) 0.005 % ophthalmic solution Place 1 drop into both eyes at bedtime.   .[name] Kitchenlosartan-hydrochlorothiazide (HYZAAR) 100-12.5 MG tablet Take 1 tablet by mouth once  daily  . magnesium oxide (MAG-OX) 400 (241.3 Mg) MG tablet TAKE 1 TABLET BY MOUTH ONCE DAILY  . metFORMIN (GLUCOPHAGE) 1000 MG tablet TAKE 1 TABLET BY MOUTH TWICE DAILY WITH MEALS  . omeprazole (PRILOSEC) 40 MG capsule Take 1 capsule (40 mg total) by mouth daily.  . pravastatin (PRAVACHOL) 40 MG tablet Take 1 tablet (40 mg total) by mouth daily.  . rivaroxaban (XARELTO) 20 MG TABS tablet Take 1 tablet (20 mg total) by mouth daily with supper.  . silver sulfADIAZINE (SILVADENE) 1 % cream Apply 1 application topically as needed.  . Thiamine HCl (VITAMIN B1) 100 MG TABS Take 1 tablet by mouth once daily  . TRULICITY 8.15 TE/7.0RA SOPN PLEASE START INJECTING 0.75MG UNDER THE SKIN WEEKLY    Allergies:   Amlodipine; Atorvastatin; Ampicillin; Codeine; Lisinopril; Penicillins; and Januvia [sitagliptin]   Social History   Tobacco Use  . Smoking  status: Former Smoker    Packs/day: 1.00    Years: 28.00    Pack years: 28.00    Types: Cigarettes    Last attempt to quit: 09/05/2001    Years since quitting: 17.0  . Smokeless tobacco: Never Used  Substance Use Topics  . Alcohol use: No    Alcohol/week: 0.0 standard drinks  . Drug use: No    Family Hx: The patient's family history includes Arthritis in an other family member; Cancer in her brother and another family member; Colon cancer in her maternal aunt; Deep vein thrombosis in her father and sister; Diabetes in her brother, brother, brother, father, and sister; Heart disease in her brother, father, and sister; Hyperlipidemia in her brother, father, mother, and sister; Hypertension in an other family member; Kidney disease in her mother; Other in her mother; Stroke in an other family member. There is no history of Esophageal cancer, Rectal cancer, or Stomach cancer.  ROS:   Please see the history of present illness.     All other systems reviewed and are negative.  Prior CV studies:   The following studies were reviewed today:  ECHO 11/22/16: - Left ventricle: The cavity size was normal. Wall thickness was normal. Systolic function was normal. The estimated ejection fraction was in the range of 55% to 60%. Wall motion was normal; there were no regional wall motion abnormalities. The study is not technically sufficient to allow evaluation of LV diastolic function. - Mitral valve: Moderately calcified annulus. Mildly thickened leaflets . Mild stenosis. There was trivial regurgitation. Valve area by pressure half-time: 1.79 cm^2. - Left atrium: Severely dilated. - Right atrium: The atrium was mildly dilated. - Inferior vena cava: The vessel was normal in size. The respirophasic diameter changes were in the normal range (>= 50%), consistent with normal central venous pressure.  Impressions:  - LVEF 55-60%, normal wall thickness, normal wall motion,  moderate MAC with mild stenosis and trivial MR, severe LAE, mild RAE, normal IVC.  Labs/Other Tests and Data Reviewed:    EKG:  An ECG dated 11/08/2016 was personally reviewed today and demonstrated:  Atrial fibrillation HR 134  Recent Labs: 10/31/2017: TSH 0.24 03/05/2018: BUN 15; Creatinine, Ser 0.79; Hemoglobin 12.7; Platelets 278.0; Potassium 3.6; Sodium 139   Recent Lipid Panel Lab Results  Component Value Date/Time   CHOL 148 03/05/2018 10:29 AM   TRIG 166.0 (H) 03/05/2018 10:29 AM   HDL 51.50 03/05/2018 10:29 AM   CHOLHDL 3 03/05/2018 10:29 AM   LDLCALC 63 03/05/2018 10:29 AM   LDLDIRECT 160.2 03/01/2010 10:01 AM  Wt Readings from Last 3 Encounters:  09/10/18 179 lb 6.4 oz (81.4 kg)  08/14/18 176 lb 8 oz (80.1 kg)  07/30/18 179 lb (81.2 kg)    Objective:    Vital Signs:  BP (!) 169/68 (BP Location: Left Arm, Patient Position: Sitting, Cuff Size: Normal)   Pulse (!) 54   Ht 5' 5.5" (1.664 m)   Wt 179 lb 6.4 oz (81.4 kg)   LMP  (LMP Unknown)   BMI 29.40 kg/m    VITAL SIGNS:  reviewed GEN:  no acute distress RESPIRATORY:  normal respiratory effort, symmetric expansion NEURO:  alert and oriented x 3, no obvious focal deficit PSYCH:  normal affect  ASSESSMENT & PLAN:    1.  Paroxysmal atrial fibrillation: -Stable>>>HR 54 -Continue Xarelto 20 mg daily and diltiazem 17m daily  -Off Plavix, ASA stopped at last office visit -Patient has a strong family history of CVA and although this was thought to be virally triggered and patient was asymptomatic, the patient was continued on anticoagulation -No acute signs of bleeding in stool or urine CHA2DS2VASc =4  2.  Hypertension: -Elevated, 169/68>> repeat 154/63>>> repeat 150/60 -Continue diltiazem 1229m losartan-HCTZ 100/12.5 -Encouraged every other day BP monitoring 1 hour post medication administration -If continues to be elevated, may need further medication titration  3.  Peripheral vascular disease:  -History of femoropopliteal bypass per Dr. Early/Dr. BrTrula SladeOff Plavix -No complaints of claudication  4.  DM2: -Last hemoglobin A1c 8.3 on 07/22/2018 -Follows closely with PCP >>> medication changes however patient reports that she has not yet started .  Will after using her old medication first  -Labs per PCP    COVID-19 Education: The signs and symptoms of COVID-19 were discussed with the patient and how to seek care for testing (follow up with PCP or arrange E-visit).  The importance of social distancing was discussed today.  Time:   Today, I have spent 20 minutes with the patient with telehealth technology discussing the above problems.     Medication Adjustments/Labs and Tests Ordered: Current medicines are reviewed at length with the patient today.  Concerns regarding medicines are outlined above.   Tests Ordered: No orders of the defined types were placed in this encounter.   Medication Changes: No orders of the defined types were placed in this encounter.   Disposition:  Follow up Dr. SkMarlou Porchn 6 months  Signed, JiKathyrn DrownNP  09/10/2018 3:41 PM    [place]

## 2018-09-10 ENCOUNTER — Encounter: Payer: Self-pay | Admitting: Cardiology

## 2018-09-10 ENCOUNTER — Other Ambulatory Visit: Payer: Self-pay

## 2018-09-10 ENCOUNTER — Telehealth (INDEPENDENT_AMBULATORY_CARE_PROVIDER_SITE_OTHER): Payer: PPO | Admitting: Cardiology

## 2018-09-10 VITALS — BP 169/68 | HR 54 | Ht 65.5 in | Wt 179.4 lb

## 2018-09-10 DIAGNOSIS — I10 Essential (primary) hypertension: Secondary | ICD-10-CM

## 2018-09-10 DIAGNOSIS — I480 Paroxysmal atrial fibrillation: Secondary | ICD-10-CM

## 2018-09-10 DIAGNOSIS — E1169 Type 2 diabetes mellitus with other specified complication: Secondary | ICD-10-CM

## 2018-09-10 DIAGNOSIS — I739 Peripheral vascular disease, unspecified: Secondary | ICD-10-CM

## 2018-09-10 NOTE — Patient Instructions (Signed)
Medication Instructions:  Your physician recommends that you continue on your current medications as directed. Please refer to the Current Medication list given to you today.  If you need a refill on your cardiac medications before your next appointment, please call your pharmacy.   Lab work: None  If you have labs (blood work) drawn today and your tests are completely normal, you will receive your results only by: [name] MyChart Message (if you have MyChart) OR . A paper copy in the mail If you have any lab test that is abnormal or we need to change your treatment, we will call you to review the results.  Testing/Procedures: None  Follow-Up: At [facility], you and your health needs are our priority.  As part of our continuing mission to provide you with exceptional heart care, we have created designated Provider Care Teams.  These Care Teams include your primary Cardiologist (physician) and Advanced Practice Providers (APPs -  Physician Assistants and Nurse Practitioners) who all work together to provide you with the care you need, when you need it. You will need a follow up appointment in 6 months.  Please call our office 2 months in advance to schedule this appointment.  You may see Dr. Marlou Porch or one of the following Advanced Practice Providers on your designated Care Team:   Truitt Merle, NP Cecilie Kicks, NP . Kathyrn Drown, NP  Any Other Special Instructions Will Be Listed Below (If Applicable).

## 2018-09-12 ENCOUNTER — Other Ambulatory Visit: Payer: Self-pay | Admitting: Internal Medicine

## 2018-09-12 DIAGNOSIS — L03031 Cellulitis of right toe: Secondary | ICD-10-CM | POA: Diagnosis not present

## 2018-09-12 DIAGNOSIS — S90421D Blister (nonthermal), right great toe, subsequent encounter: Secondary | ICD-10-CM | POA: Diagnosis not present

## 2018-09-16 ENCOUNTER — Telehealth: Payer: Self-pay | Admitting: Gastroenterology

## 2018-09-16 NOTE — Telephone Encounter (Signed)
The pt aware that she will be scheduled as soon as possible and is on the list for June. The pt has been advised of the information and verbalized understanding.

## 2018-09-19 DIAGNOSIS — S90421D Blister (nonthermal), right great toe, subsequent encounter: Secondary | ICD-10-CM | POA: Diagnosis not present

## 2018-09-19 DIAGNOSIS — E1151 Type 2 diabetes mellitus with diabetic peripheral angiopathy without gangrene: Secondary | ICD-10-CM | POA: Diagnosis not present

## 2018-09-19 DIAGNOSIS — L03031 Cellulitis of right toe: Secondary | ICD-10-CM | POA: Diagnosis not present

## 2018-09-25 ENCOUNTER — Other Ambulatory Visit: Payer: Self-pay | Admitting: Cardiology

## 2018-09-25 ENCOUNTER — Other Ambulatory Visit: Payer: Self-pay | Admitting: Internal Medicine

## 2018-09-25 ENCOUNTER — Other Ambulatory Visit: Payer: Self-pay

## 2018-09-25 ENCOUNTER — Telehealth: Payer: Self-pay | Admitting: Gastroenterology

## 2018-09-25 ENCOUNTER — Telehealth: Payer: Self-pay

## 2018-09-25 DIAGNOSIS — K22711 Barrett's esophagus with high grade dysplasia: Secondary | ICD-10-CM

## 2018-09-25 DIAGNOSIS — E1151 Type 2 diabetes mellitus with diabetic peripheral angiopathy without gangrene: Secondary | ICD-10-CM

## 2018-09-25 NOTE — Telephone Encounter (Signed)
Pt takes Xarelto for afib with CHADS2VASc score of (sex, HTN, DM, PVD). Renal function is normal. Ok to hold Xarelto for 2 days prior to procedure.

## 2018-09-25 NOTE — Telephone Encounter (Signed)
Thanks Dr. Loletha Carrow. Tonie Griffith has her on her list of patients. However, let's get an appointment up for this patient so we can have her on the books. Thanks. GM

## 2018-09-25 NOTE — Telephone Encounter (Signed)
Theresa Harrington,    This patient has been in the work queue for EGD with EMR with Dr. Lezlie Octave ([facility] with [facility]).  I am aware hospital outpatient endoscopy cases are still being caught up due to [place].   This is high priority, and I spoke with Dr. Rush Landmark about it again today.  He would like this case set up within next 3 weeks.  Thank you.  - HD

## 2018-09-25 NOTE — Telephone Encounter (Signed)
Please comment on xarelto. 

## 2018-09-25 NOTE — Telephone Encounter (Signed)
Patient should be off Xarelto for 2-days. Looks like recently seen by Cardiology. Insulin long-acting should be 1/2 the dose. 2-hour case. Thanks. GM

## 2018-09-25 NOTE — Telephone Encounter (Signed)
Dr. Rush Landmark, I will work on getting this patient scheduled for next week. How much time will you need? Holding Xarelto x2 days prior to procedure? Any adjustment to DM meds?

## 2018-09-25 NOTE — Telephone Encounter (Signed)
Pt scheduled for 09/30/2018 @ [place] 8:30am EGD +EMR. Pt has been informed.

## 2018-09-25 NOTE — Telephone Encounter (Signed)
Thanks for the update. We will try to help her as best we can. GM

## 2018-09-25 NOTE — Telephone Encounter (Signed)
Request for surgical clearance:     Endoscopy Procedure  What type of surgery is being performed?     EGD+EMR  When is this surgery scheduled?     09/30/2018  What type of clearance is required ?   Pharmacy  Are there any medications that need to be held prior to surgery and how long? Xarelto  x 2 days prior to procedure  Practice name and name of physician performing surgery?      [facility]  What is your office phone and fax number?      Phone- [phone]  Fax- [phone] Attn: Helmut Muster   Anesthesia type (None, local, MAC, general) ?       MAC

## 2018-09-26 ENCOUNTER — Other Ambulatory Visit (HOSPITAL_COMMUNITY)
Admission: RE | Admit: 2018-09-26 | Discharge: 2018-09-26 | Disposition: A | Discharge status: Home / Self Care | Payer: PPO | Source: Ambulatory Visit | Attending: Gastroenterology | Admitting: Gastroenterology

## 2018-09-26 DIAGNOSIS — Z1159 Encounter for screening for other viral diseases: Secondary | ICD-10-CM | POA: Diagnosis not present

## 2018-09-26 DIAGNOSIS — S90421D Blister (nonthermal), right great toe, subsequent encounter: Secondary | ICD-10-CM | POA: Diagnosis not present

## 2018-09-26 DIAGNOSIS — S98132A Complete traumatic amputation of one left lesser toe, initial encounter: Secondary | ICD-10-CM | POA: Diagnosis not present

## 2018-09-26 NOTE — Telephone Encounter (Signed)
   Primary Cardiologist: [name], MD  Chart reviewed as part of pre-operative protocol coverage. Patient was contacted 09/26/2018 in reference to pre-operative risk assessment for pending surgery as outlined below.  Theresa Harrington was last seen on 09/10/18 by Kathyrn Drown NP.  Since that day, Theresa Harrington has done. She denies any new or worsening cardiac symptoms. She can complete 4.0 METS.  Per our pharmacy staff: Pt takes Xarelto for afib with CHADS2VASc score of (sex, HTN, DM, PVD). Renal function is normal. Ok to hold Xarelto for 2 days prior to procedure.   Therefore, based on ACC/AHA guidelines, the patient would be at acceptable risk for the planned procedure without further cardiovascular testing.   I will route this recommendation to the requesting party via Epic fax function and remove from pre-op pool.  Please call with questions.  Tami Lin Tayten Bergdoll, PA 09/26/2018, 8:27 AM

## 2018-09-26 NOTE — Telephone Encounter (Signed)
Please see below clearance. Pt has been informed to hold Xarelto.

## 2018-09-27 ENCOUNTER — Other Ambulatory Visit: Payer: Self-pay

## 2018-09-27 ENCOUNTER — Encounter (HOSPITAL_COMMUNITY): Payer: Self-pay | Admitting: *Deleted

## 2018-09-27 LAB — NOVEL CORONAVIRUS, NAA (HOSP ORDER, SEND-OUT TO REF LAB; TAT 18-24 HRS): SARS-CoV-2, NAA: NOT DETECTED

## 2018-09-27 NOTE — Progress Notes (Addendum)
Theresa Harrington denies chest pain or shortness of breath. Theresa Harrington had [place] test 09/26/2018, she reports that she is quarantined at home, with only the people who live with her in the home and that is home is will be this weekend. Theresa Harrington has Type II diabetes, she reports that CBGs run 110- 146, has been greater than 220, but not in the past few days. I instructed patient to take 1/2of Levemir on Sunday night- 40 units and to not take any medication for diabetes on Monday am. I instructed patient to check CBG after awaking and every 2 hours until arrival  to the hospital.  I Instructed patient if CBG is less than 70 to drink 1/2 cup of a clear juice. Recheck CBG in 15 minutes then call pre- op desk at [phone] for further instructions. Theresa Harrington reports that she was instructed to stop Xarelto 2 days prior to procedure.

## 2018-09-29 ENCOUNTER — Telehealth: Payer: Self-pay | Admitting: Gastroenterology

## 2018-09-29 NOTE — Telephone Encounter (Signed)
Due to an unforeseen emergency, this patient's case will be rescheduled. I have spoken with patient. Discussed briefly issue of HGD and our need for endoscopic attempt at management. I believe that we can wait another 2-3 weeks. I will get her on my schedule within that time period. I will alert Dr. Loletha Carrow to the change in plans as well. I offered patient possible referral to quaternary center due to inconvenience of having to reschedule, but she was OK with waiting. I'll place my staff on getting her rescheduled in 1.5-3 weeks.  Justice Britain, MD [facility] Advanced Endoscopy Office # [phone]

## 2018-09-30 NOTE — Telephone Encounter (Signed)
Patient r/s for 10/14/2018 @ [place] 8:30am

## 2018-10-03 DIAGNOSIS — E1151 Type 2 diabetes mellitus with diabetic peripheral angiopathy without gangrene: Secondary | ICD-10-CM | POA: Diagnosis not present

## 2018-10-03 DIAGNOSIS — L97522 Non-pressure chronic ulcer of other part of left foot with fat layer exposed: Secondary | ICD-10-CM | POA: Diagnosis not present

## 2018-10-08 ENCOUNTER — Other Ambulatory Visit: Payer: Self-pay

## 2018-10-08 ENCOUNTER — Encounter: Payer: Self-pay | Admitting: Internal Medicine

## 2018-10-08 ENCOUNTER — Ambulatory Visit (INDEPENDENT_AMBULATORY_CARE_PROVIDER_SITE_OTHER): Payer: PPO | Admitting: Internal Medicine

## 2018-10-08 ENCOUNTER — Other Ambulatory Visit (INDEPENDENT_AMBULATORY_CARE_PROVIDER_SITE_OTHER): Payer: PPO

## 2018-10-08 VITALS — BP 124/64 | HR 65 | Temp 97.9°F | Resp 16 | Ht 65.5 in | Wt 173.0 lb

## 2018-10-08 DIAGNOSIS — E042 Nontoxic multinodular goiter: Secondary | ICD-10-CM

## 2018-10-08 DIAGNOSIS — Z114 Encounter for screening for human immunodeficiency virus [HIV]: Secondary | ICD-10-CM | POA: Insufficient documentation

## 2018-10-08 DIAGNOSIS — E785 Hyperlipidemia, unspecified: Secondary | ICD-10-CM | POA: Diagnosis not present

## 2018-10-08 DIAGNOSIS — E538 Deficiency of other specified B group vitamins: Secondary | ICD-10-CM | POA: Diagnosis not present

## 2018-10-08 DIAGNOSIS — Z1159 Encounter for screening for other viral diseases: Secondary | ICD-10-CM

## 2018-10-08 DIAGNOSIS — D509 Iron deficiency anemia, unspecified: Secondary | ICD-10-CM

## 2018-10-08 DIAGNOSIS — E876 Hypokalemia: Secondary | ICD-10-CM | POA: Diagnosis not present

## 2018-10-08 DIAGNOSIS — E1151 Type 2 diabetes mellitus with diabetic peripheral angiopathy without gangrene: Secondary | ICD-10-CM

## 2018-10-08 DIAGNOSIS — IMO0002 Reserved for concepts with insufficient information to code with codable children: Secondary | ICD-10-CM

## 2018-10-08 DIAGNOSIS — E1165 Type 2 diabetes mellitus with hyperglycemia: Secondary | ICD-10-CM

## 2018-10-08 DIAGNOSIS — T502X5A Adverse effect of carbonic-anhydrase inhibitors, benzothiadiazides and other diuretics, initial encounter: Secondary | ICD-10-CM

## 2018-10-08 LAB — LIPID PANEL
Cholesterol: 109 mg/dL (ref 0–200)
HDL: 34.8 mg/dL — ABNORMAL LOW (ref >39.00)
LDL Cholesterol: 42 mg/dL (ref 0–99)
NonHDL: 73.76
Total CHOL/HDL Ratio: 3
Triglycerides: 158 mg/dL — ABNORMAL HIGH (ref 0.0–149.0)
VLDL: 31.6 mg/dL (ref 0.0–40.0)

## 2018-10-08 LAB — COMPREHENSIVE METABOLIC PANEL
ALT: 20 U/L (ref 0–35)
AST: 18 U/L (ref 0–37)
Albumin: 4 g/dL (ref 3.5–5.2)
Alkaline Phosphatase: 53 U/L (ref 39–117)
BUN: 14 mg/dL (ref 6–23)
CO2: 29 mEq/L (ref 19–32)
Calcium: 9.3 mg/dL (ref 8.4–10.5)
Chloride: 101 mEq/L (ref 96–112)
Creatinine, Ser: 0.71 mg/dL (ref 0.40–1.20)
GFR: 101.05 mL/min (ref >60.00)
Glucose, Bld: 119 mg/dL — ABNORMAL HIGH (ref 70–99)
Potassium: 3.4 mEq/L — ABNORMAL LOW (ref 3.5–5.1)
Sodium: 139 mEq/L (ref 135–145)
Total Bilirubin: 0.4 mg/dL (ref 0.2–1.2)
Total Protein: 6.9 g/dL (ref 6.0–8.3)

## 2018-10-08 LAB — CBC WITH DIFFERENTIAL/PLATELET
Basophils Absolute: 0 10*3/uL (ref 0.0–0.1)
Basophils Relative: 0.3 % (ref 0.0–3.0)
Eosinophils Absolute: 0 10*3/uL (ref 0.0–0.7)
Eosinophils Relative: 0.3 % (ref 0.0–5.0)
HCT: 39.7 % (ref 36.0–46.0)
Hemoglobin: 13.2 g/dL (ref 12.0–15.0)
Lymphocytes Relative: 35.6 % (ref 12.0–46.0)
Lymphs Abs: 1.9 10*3/uL (ref 0.7–4.0)
MCHC: 33.3 g/dL (ref 30.0–36.0)
MCV: 89.5 fl (ref 78.0–100.0)
Monocytes Absolute: 0.5 10*3/uL (ref 0.1–1.0)
Monocytes Relative: 8.8 % (ref 3.0–12.0)
Neutro Abs: 3 10*3/uL (ref 1.4–7.7)
Neutrophils Relative %: 55 % (ref 43.0–77.0)
Platelets: 188 10*3/uL (ref 150.0–400.0)
RBC: 4.44 Mil/uL (ref 3.87–5.11)
RDW: 12.9 % (ref 11.5–15.5)
WBC: 5.4 10*3/uL (ref 4.0–10.5)

## 2018-10-08 LAB — POCT GLYCOSYLATED HEMOGLOBIN (HGB A1C): Hemoglobin A1C: 7.5 % — AB (ref 4.0–5.6)

## 2018-10-08 LAB — TSH: TSH: 0.27 u[IU]/mL — ABNORMAL LOW (ref 0.35–4.50)

## 2018-10-08 MED ORDER — POTASSIUM CHLORIDE CRYS ER 20 MEQ PO TBCR: 20.0000 meq | EXTENDED_RELEASE_TABLET | Freq: Two times a day (BID) | ORAL | 1 refills | Status: DC

## 2018-10-08 MED ORDER — CYANOCOBALAMIN 1000 MCG/ML IJ SOLN
1000.0000 ug | Freq: Once | INTRAMUSCULAR | Status: AC
  Administered 2018-10-08: 1000 ug via INTRAMUSCULAR

## 2018-10-08 NOTE — Patient Instructions (Signed)
Type 2 Diabetes Mellitus, Diagnosis, Adult Type 2 diabetes (type 2 diabetes mellitus) is a long-term (chronic) disease. In type 2 diabetes, one or both of these problems may be present:  The pancreas does not make enough of a hormone called insulin.  Cells in the body do not respond properly to insulin that the body makes (insulin resistance). Normally, insulin allows blood sugar (glucose) to enter cells in the body. The cells use glucose for energy. Insulin resistance or lack of insulin causes excess glucose to build up in the blood instead of going into cells. As a result, high blood glucose (hyperglycemia) develops. What increases the risk? The following factors may make you more likely to develop type 2 diabetes:  Having a family member with type 2 diabetes.  Being overweight or obese.  Having an inactive (sedentary) lifestyle.  Having been diagnosed with insulin resistance.  Having a history of prediabetes, gestational diabetes, or polycystic ovary syndrome (PCOS).  Being of American-Indian, African-American, Hispanic/Latino, or Asian/Pacific Islander descent. What are the signs or symptoms? In the early stage of this condition, you may not have symptoms. Symptoms develop slowly and may include:  Increased thirst (polydipsia).  Increased hunger(polyphagia).  Increased urination (polyuria).  Increased urination during the night (nocturia).  Unexplained weight loss.  Frequent infections that keep coming back (recurring).  Fatigue.  Weakness.  Vision changes, such as blurry vision.  Cuts or bruises that are slow to heal.  Tingling or numbness in the hands or feet.  Dark patches on the skin (acanthosis nigricans). How is this diagnosed? This condition is diagnosed based on your symptoms, your medical history, a physical exam, and your blood glucose level. Your blood glucose may be checked with one or more of the following blood tests:  A fasting blood glucose (FBG)  test. You will not be allowed to eat (you will fast) for 8 hours or longer before a blood sample is taken.  A random blood glucose test. This test checks blood glucose at any time of day regardless of when you ate.  An A1c (hemoglobin A1c) blood test. This test provides information about blood glucose control over the previous 2-3 months.  An oral glucose tolerance test (OGTT). This test measures your blood glucose at two times: ? After fasting. This is your baseline blood glucose level. ? Two hours after drinking a beverage that contains glucose. You may be diagnosed with type 2 diabetes if:  Your FBG level is 126 mg/dL (7.0 mmol/L) or higher.  Your random blood glucose level is 200 mg/dL (11.1 mmol/L) or higher.  Your A1c level is 6.5% or higher.  Your OGTT result is higher than 200 mg/dL (11.1 mmol/L). These blood tests may be repeated to confirm your diagnosis. How is this treated? Your treatment may be managed by a specialist called an endocrinologist. Type 2 diabetes may be treated by following instructions from your health care provider about:  Making diet and lifestyle changes. This may include: ? Following an individualized nutrition plan that is developed by a diet and nutrition specialist (registered dietitian). ? Exercising regularly. ? Finding ways to manage stress.  Checking your blood glucose level as often as told.  Taking diabetes medicines or insulin daily. This helps to keep your blood glucose levels in the healthy range. ? If you use insulin, you may need to adjust the dosage depending on how physically active you are and what foods you eat. Your health care provider will tell you how to adjust your dosage.    Taking medicines to help prevent complications from diabetes, such as: ? Aspirin. ? Medicine to lower cholesterol. ? Medicine to control blood pressure. Your health care provider will set individualized treatment goals for you. Your goals will be based on  your age, other medical conditions you have, and how you respond to diabetes treatment. Generally, the goal of treatment is to maintain the following blood glucose levels:  Before meals (preprandial): 80-130 mg/dL (4.4-7.2 mmol/L).  After meals (postprandial): below 180 mg/dL (10 mmol/L).  A1c level: less than 7%. Follow these instructions at home: Questions to ask your health care provider  Consider asking the following questions: ? Do I need to meet with a diabetes educator? ? Where can I find a support group for people with diabetes? ? What equipment will I need to manage my diabetes at home? ? What diabetes medicines do I need, and when should I take them? ? How often do I need to check my blood glucose? ? What number can I call if I have questions? ? When is my next appointment? General instructions  Take over-the-counter and prescription medicines only as told by your health care provider.  Keep all follow-up visits as told by your health care provider. This is important.  For more information about diabetes, visit: ? American Diabetes Association (ADA): www.diabetes.org ? American Association of Diabetes Educators (AADE): www.diabeteseducator.org Contact a health care provider if:  Your blood glucose is at or above 240 mg/dL (13.3 mmol/L) for 2 days in a row.  You have been sick or have had a fever for 2 days or longer, and you are not getting better.  You have any of the following problems for more than 6 hours: ? You cannot eat or drink. ? You have nausea and vomiting. ? You have diarrhea. Get help right away if:  Your blood glucose is lower than 54 mg/dL (3.0 mmol/L).  You become confused or you have trouble thinking clearly.  You have difficulty breathing.  You have moderate or large ketone levels in your urine. Summary  Type 2 diabetes (type 2 diabetes mellitus) is a long-term (chronic) disease. In type 2 diabetes, the pancreas does not make enough of a  hormone called insulin, or cells in the body do not respond properly to insulin that the body makes (insulin resistance).  This condition is treated by making diet and lifestyle changes and taking diabetes medicines or insulin.  Your health care provider will set individualized treatment goals for you. Your goals will be based on your age, other medical conditions you have, and how you respond to diabetes treatment.  Keep all follow-up visits as told by your health care provider. This is important. This information is not intended to replace advice given to you by your health care provider. Make sure you discuss any questions you have with your health care provider. Document Released: 04/17/2005 Document Revised: 11/16/2016 Document Reviewed: 05/21/2015 Elsevier Interactive Patient Education  2019 Elsevier Inc.  

## 2018-10-08 NOTE — Progress Notes (Signed)
Subjective:  Patient ID: Theresa Harrington, female    DOB: [birth year]  Age: 63 y.o. MRN: [id number]  CC: Diabetes; Anemia; and Hyperlipidemia   HPI Theresa Harrington presents for f/up - She has been working on her lifestyle modifications and has been able to lose weight.  She tells me her blood sugars have been relatively well controlled.  She denies any recent polys, CP, DOE, palpitations, edema, or fatigue.  Outpatient Medications Prior to Visit  Medication Sig Dispense Refill  . CARTIA XT 120 MG 24 hr capsule Take 1 capsule by mouth once daily (Patient taking differently: Take 120 mg by mouth daily. ) 90 capsule 0  . ezetimibe (ZETIA) 10 MG tablet Take 10 mg by mouth at bedtime.     . insulin aspart (NOVOLOG) 100 UNIT/ML injection Inject 14-20 Units into the skin 3 (three) times daily before meals.    . Insulin Glargine (BASAGLAR KWIKPEN) 100 UNIT/ML SOPN Inject 0.8 mLs (80 Units total) into the skin daily. 9 mL 1  . Insulin Pen Needle (PEN NEEDLES) 32G X 4 MM MISC Inject 1 pen as directed 4 (four) times daily. Use to inject levemir or novolog as directed. 400 each 3  . latanoprost (XALATAN) 0.005 % ophthalmic solution Place 1 drop into both eyes at bedtime.     [name] losartan-hydrochlorothiazide (HYZAAR) 100-12.5 MG tablet Take 1 tablet by mouth once daily 90 tablet 1  . magnesium oxide (MAG-OX) 400 (241.3 Mg) MG tablet TAKE 1 TABLET BY MOUTH ONCE DAILY (Patient taking differently: Take 400 mg by mouth daily. ) 90 tablet 1  . metFORMIN (GLUCOPHAGE) 1000 MG tablet TAKE 1 TABLET BY MOUTH TWICE DAILY WITH MEALS (Patient taking differently: Take 1,000 mg by mouth 2 (two) times a day. ) 180 tablet 0  . omeprazole (PRILOSEC) 40 MG capsule Take 1 capsule (40 mg total) by mouth daily. 90 capsule 3  . pravastatin (PRAVACHOL) 40 MG tablet Take 1 tablet (40 mg total) by mouth daily. (Patient taking differently: Take 40 mg by mouth at bedtime. ) 90 tablet 3  . rivaroxaban (XARELTO) 20 MG TABS tablet Take 1 tablet (20  mg total) by mouth daily with supper. (Patient taking differently: Take 20 mg by mouth daily. ) 90 tablet 3  . Thiamine HCl (VITAMIN B1) 100 MG TABS Take 1 tablet by mouth once daily (Patient taking differently: Take 100 mg by mouth at bedtime. ) 90 tablet 0  . TRULICITY 9.41 DE/0.8XK SOPN PLEASE START INJECTING 0.75MG  UNDER THE SKIN WEEKLY (Patient taking differently: Inject 0.75 mg as directed every Monday. ) 4 mL 0  . Insulin Detemir (LEVEMIR FLEXTOUCH) 100 UNIT/ML Pen Inject 80 Units into the skin at bedtime.     No facility-administered medications prior to visit.     ROS Review of Systems  Constitutional: Negative for diaphoresis and fatigue.  HENT: Negative.   Eyes: Negative for visual disturbance.  Respiratory: Negative for cough, chest tightness and shortness of breath.   Cardiovascular: Negative for chest pain, palpitations and leg swelling.  Gastrointestinal: Negative for abdominal pain, diarrhea, nausea and vomiting.  Endocrine: Negative.   Genitourinary: Negative.  Negative for difficulty urinating and dysuria.  Musculoskeletal: Negative.  Negative for arthralgias and myalgias.  Skin: Negative.   Neurological: Negative.  Negative for dizziness and weakness.  Hematological: Does not bruise/bleed easily.  Psychiatric/Behavioral: Negative.     Objective:  BP 124/64 (BP Location: Left Arm, Patient Position: Sitting, Cuff Size: Normal)   Pulse 65   Temp 97.9  F (36.6 C) (Oral)   Resp 16   Ht 5' 5.5" (1.664 m)   Wt 173 lb (78.5 kg)   LMP  (LMP Unknown)   SpO2 98%   BMI 28.35 kg/m   BP Readings from Last 3 Encounters:  10/08/18 124/64  09/10/18 (!) 169/68  08/14/18 132/80    Wt Readings from Last 3 Encounters:  10/08/18 173 lb (78.5 kg)  09/10/18 179 lb 6.4 oz (81.4 kg)  08/14/18 176 lb 8 oz (80.1 kg)    Physical Exam Constitutional:      Appearance: She is not ill-appearing or diaphoretic.  HENT:     Nose: Nose normal. No rhinorrhea.     Mouth/Throat:      Mouth: Mucous membranes are moist.     Pharynx: No posterior oropharyngeal erythema.  Eyes:     General: No scleral icterus.    Conjunctiva/sclera: Conjunctivae normal.  Neck:     Musculoskeletal: Normal range of motion. No neck rigidity.     Thyroid: No thyroid mass, thyromegaly or thyroid tenderness.  Cardiovascular:     Rate and Rhythm: Normal rate and regular rhythm.     Heart sounds: No murmur. No gallop.   Pulmonary:     Breath sounds: No stridor. No wheezing, rhonchi or rales.  Abdominal:     General: Abdomen is protuberant. Bowel sounds are normal.     Palpations: Abdomen is soft.     Tenderness: There is no abdominal tenderness.  Musculoskeletal: Normal range of motion.     Right lower leg: No edema.     Left lower leg: No edema.  Lymphadenopathy:     Cervical: No cervical adenopathy.  Skin:    General: Skin is warm and dry.  Neurological:     General: No focal deficit present.     Mental Status: She is alert.  Psychiatric:        Mood and Affect: Mood normal.        Behavior: Behavior normal.     Lab Results  Component Value Date   WBC 5.4 10/08/2018   HGB 13.2 10/08/2018   HCT 39.7 10/08/2018   PLT 188.0 10/08/2018   GLUCOSE 119 (H) 10/08/2018   CHOL 109 10/08/2018   TRIG 158.0 (H) 10/08/2018   HDL 34.80 (L) 10/08/2018   LDLDIRECT 160.2 03/01/2010   LDLCALC 42 10/08/2018   ALT 20 10/08/2018   AST 18 10/08/2018   NA 139 10/08/2018   K 3.4 (L) 10/08/2018   CL 101 10/08/2018   CREATININE 0.71 10/08/2018   BUN 14 10/08/2018   CO2 29 10/08/2018   TSH 0.27 (L) 10/08/2018   INR 1.05 07/16/2014   HGBA1C 7.5 (A) 10/08/2018   MICROALBUR 6.3 (H) 03/05/2018    No results found.  Assessment & Plan:   Sonali was seen today for diabetes, anemia and hyperlipidemia.  Diagnoses and all orders for this visit:  GOITER, MULTINODULAR- Her TSH remains mildly suppressed but clinically she is euthyroid.  No further investigation or treatment is indicated at this  time. -     TSH; Future  Iron deficiency anemia, unspecified iron deficiency anemia type- Her H&H are normal now. -     CBC with Differential/Platelet; Future  Hyperlipidemia with target LDL less than 100- She has achieved her LDL goal and is doing well on the statin. -     Comprehensive metabolic panel; Future -     Lipid panel; Future  Encounter for screening for HIV -  HIV Antibody (routine testing w rflx); Future  Need for hepatitis C screening test -     Hepatitis C antibody; Future  Uncontrolled type 2 diabetes mellitus with peripheral artery disease ([place])- Her A1c is down to 7.5%.  She was praised for her lifestyle modifications and was asked to continue with the current glycemic regimen. -     Comprehensive metabolic panel; Future -     POCT glycosylated hemoglobin (Hb A1C)  B12 deficiency -     cyanocobalamin ((VITAMIN B-12)) injection 1,000 mcg  Diuretic-induced hypokalemia -     potassium chloride SA (K-DUR) 20 MEQ tablet; Take 1 tablet (20 mEq total) by mouth 2 (two) times daily.   I am having Farida Loh start on potassium chloride SA. I am also having her maintain her Pen Needles, magnesium oxide, latanoprost, rivaroxaban, pravastatin, Trulicity, Basaglar KwikPen, Vitamin B1, omeprazole, losartan-hydrochlorothiazide, ezetimibe, metFORMIN, Cartia XT, and insulin aspart. We administered cyanocobalamin.  Meds ordered this encounter  Medications  . cyanocobalamin ((VITAMIN B-12)) injection 1,000 mcg  . potassium chloride SA (K-DUR) 20 MEQ tablet    Sig: Take 1 tablet (20 mEq total) by mouth 2 (two) times daily.    Dispense:  180 tablet    Refill:  1     Follow-up: Return in about 6 months (around 04/09/2019).  Scarlette Calico, MD

## 2018-10-09 ENCOUNTER — Encounter: Payer: Self-pay | Admitting: Internal Medicine

## 2018-10-09 LAB — HEPATITIS C ANTIBODY
Hepatitis C Ab: NONREACTIVE
SIGNAL TO CUT-OFF: 0.01 (ref <1.00)

## 2018-10-09 LAB — HIV ANTIBODY (ROUTINE TESTING W REFLEX): HIV 1&2 Ab, 4th Generation: NONREACTIVE

## 2018-10-10 ENCOUNTER — Telehealth: Payer: Self-pay | Admitting: Gastroenterology

## 2018-10-10 ENCOUNTER — Other Ambulatory Visit: Payer: Self-pay | Admitting: Internal Medicine

## 2018-10-10 ENCOUNTER — Other Ambulatory Visit: Payer: Self-pay

## 2018-10-10 ENCOUNTER — Telehealth: Payer: Self-pay | Admitting: Internal Medicine

## 2018-10-10 ENCOUNTER — Other Ambulatory Visit (HOSPITAL_COMMUNITY): Payer: PPO

## 2018-10-10 ENCOUNTER — Other Ambulatory Visit (HOSPITAL_COMMUNITY)
Admission: RE | Admit: 2018-10-10 | Discharge: 2018-10-10 | Disposition: A | Discharge status: Home / Self Care | Payer: PPO | Source: Ambulatory Visit | Attending: Gastroenterology | Admitting: Gastroenterology

## 2018-10-10 DIAGNOSIS — Z1159 Encounter for screening for other viral diseases: Secondary | ICD-10-CM | POA: Diagnosis not present

## 2018-10-10 MED ORDER — ONETOUCH VERIO IQ SYSTEM W/DEVICE KIT: PACK | 0 refills | Status: DC

## 2018-10-10 NOTE — Telephone Encounter (Signed)
Routing to dr crawford----last rx for this request was sent in 2018---please advise if ok to resend in the absence of dr [name], I will call patient back, thanks

## 2018-10-10 NOTE — Telephone Encounter (Signed)
Fine to send in strips for her.

## 2018-10-10 NOTE — Telephone Encounter (Signed)
Pt is scheduled for a procedure at [facility] and requested a call to discuss details.

## 2018-10-10 NOTE — Telephone Encounter (Signed)
Patient called stating that a prescription for ONE TOUCH VERIO Test Strips was suppose to be sent in to the [facility] in [place] when she was here for her most recent visit on 10/08/2018.  Please advise.

## 2018-10-10 NOTE — Telephone Encounter (Signed)
Returned patient's call. She just wanted to confirm date and time of procedure on Monday @ [facility]. Also she wanted to know when the hospital would call her, I informed her that they should contact her tomorrow.

## 2018-10-10 NOTE — Telephone Encounter (Signed)
lvm advising that rx has been sent to [facility]/[place]

## 2018-10-11 ENCOUNTER — Other Ambulatory Visit: Payer: Self-pay

## 2018-10-11 ENCOUNTER — Encounter (HOSPITAL_COMMUNITY): Payer: Self-pay | Admitting: *Deleted

## 2018-10-11 LAB — NOVEL CORONAVIRUS, NAA (HOSP ORDER, SEND-OUT TO REF LAB; TAT 18-24 HRS): SARS-CoV-2, NAA: NOT DETECTED

## 2018-10-11 NOTE — Progress Notes (Signed)
Theresa Harrington is quarantined at home after COVID test.  Patient has type II diabetes, she reports that CBGs run 110 - 150.  I instructed patient to take 1/2 of Levemir on Sunday night. I instructed patient to check CBG after awaking and every 2 hours until arrival  to the hospital.  I Instructed patient if CBG is less than 70 to take 4 Glucose Tablets or 1 tube of Glucose GelcRecheck CBG in 15 minutes then call pre- op desk at [phone] for further instructions.

## 2018-10-14 ENCOUNTER — Encounter (HOSPITAL_COMMUNITY)
Admission: RE | Disposition: A | Discharge status: Home / Self Care | Payer: Self-pay | Source: Home / Self Care | Attending: Gastroenterology

## 2018-10-14 ENCOUNTER — Ambulatory Visit (HOSPITAL_COMMUNITY)
Admission: RE | Admit: 2018-10-14 | Discharge: 2018-10-14 | Disposition: A | Discharge status: Home / Self Care | Payer: PPO | Attending: Gastroenterology | Admitting: Gastroenterology

## 2018-10-14 ENCOUNTER — Telehealth: Payer: Self-pay

## 2018-10-14 ENCOUNTER — Ambulatory Visit (HOSPITAL_COMMUNITY): Payer: PPO | Admitting: Anesthesiology

## 2018-10-14 ENCOUNTER — Encounter (HOSPITAL_COMMUNITY): Payer: Self-pay | Admitting: *Deleted

## 2018-10-14 DIAGNOSIS — Z880 Allergy status to penicillin: Secondary | ICD-10-CM | POA: Insufficient documentation

## 2018-10-14 DIAGNOSIS — K219 Gastro-esophageal reflux disease without esophagitis: Secondary | ICD-10-CM | POA: Insufficient documentation

## 2018-10-14 DIAGNOSIS — E1151 Type 2 diabetes mellitus with diabetic peripheral angiopathy without gangrene: Secondary | ICD-10-CM | POA: Diagnosis not present

## 2018-10-14 DIAGNOSIS — I480 Paroxysmal atrial fibrillation: Secondary | ICD-10-CM | POA: Insufficient documentation

## 2018-10-14 DIAGNOSIS — K3189 Other diseases of stomach and duodenum: Secondary | ICD-10-CM

## 2018-10-14 DIAGNOSIS — K228 Other specified diseases of esophagus: Secondary | ICD-10-CM | POA: Diagnosis not present

## 2018-10-14 DIAGNOSIS — Z888 Allergy status to other drugs, medicaments and biological substances status: Secondary | ICD-10-CM | POA: Insufficient documentation

## 2018-10-14 DIAGNOSIS — K22711 Barrett's esophagus with high grade dysplasia: Secondary | ICD-10-CM | POA: Insufficient documentation

## 2018-10-14 DIAGNOSIS — Z87891 Personal history of nicotine dependence: Secondary | ICD-10-CM | POA: Insufficient documentation

## 2018-10-14 DIAGNOSIS — I10 Essential (primary) hypertension: Secondary | ICD-10-CM | POA: Diagnosis not present

## 2018-10-14 DIAGNOSIS — K2950 Unspecified chronic gastritis without bleeding: Secondary | ICD-10-CM | POA: Insufficient documentation

## 2018-10-14 DIAGNOSIS — K449 Diaphragmatic hernia without obstruction or gangrene: Secondary | ICD-10-CM | POA: Insufficient documentation

## 2018-10-14 DIAGNOSIS — K22719 Barrett's esophagus with dysplasia, unspecified: Secondary | ICD-10-CM | POA: Diagnosis not present

## 2018-10-14 DIAGNOSIS — E785 Hyperlipidemia, unspecified: Secondary | ICD-10-CM | POA: Diagnosis not present

## 2018-10-14 DIAGNOSIS — Z89422 Acquired absence of other left toe(s): Secondary | ICD-10-CM | POA: Diagnosis not present

## 2018-10-14 DIAGNOSIS — Z9884 Bariatric surgery status: Secondary | ICD-10-CM | POA: Insufficient documentation

## 2018-10-14 DIAGNOSIS — Z885 Allergy status to narcotic agent status: Secondary | ICD-10-CM | POA: Insufficient documentation

## 2018-10-14 HISTORY — PX: BIOPSY: SHX5522

## 2018-10-14 HISTORY — PX: ESOPHAGOGASTRODUODENOSCOPY: SHX5428

## 2018-10-14 LAB — GLUCOSE, CAPILLARY
Glucose-Capillary: 73 mg/dL (ref 70–99)
Glucose-Capillary: 81 mg/dL (ref 70–99)
Glucose-Capillary: 96 mg/dL (ref 70–99)

## 2018-10-14 SURGERY — EGD (ESOPHAGOGASTRODUODENOSCOPY)
Anesthesia: Monitor Anesthesia Care

## 2018-10-14 MED ORDER — LACTATED RINGERS IV SOLN
INTRAVENOUS | Status: DC
  Administered 2018-10-14: 10:00:00 via INTRAVENOUS

## 2018-10-14 MED ORDER — PROPOFOL 10 MG/ML IV BOLUS
INTRAVENOUS | Status: DC | PRN
  Administered 2018-10-14: 40 mg via INTRAVENOUS
  Administered 2018-10-14: 20 mg via INTRAVENOUS

## 2018-10-14 MED ORDER — OMEPRAZOLE 40 MG PO CPDR: 40.0000 mg | DELAYED_RELEASE_CAPSULE | Freq: Two times a day (BID) | ORAL | 3 refills | Status: DC

## 2018-10-14 MED ORDER — PROPOFOL 500 MG/50ML IV EMUL
INTRAVENOUS | Status: DC | PRN
  Administered 2018-10-14: 150 ug/kg/min via INTRAVENOUS

## 2018-10-14 MED ORDER — SODIUM CHLORIDE 0.9 % IV SOLN: INTRAVENOUS | Status: DC

## 2018-10-14 MED ORDER — LIDOCAINE HCL (CARDIAC) PF 100 MG/5ML IV SOSY
PREFILLED_SYRINGE | INTRAVENOUS | Status: DC | PRN
  Administered 2018-10-14: 30 mg via INTRAVENOUS

## 2018-10-14 NOTE — H&P (Signed)
GASTROENTEROLOGY OUTPATIENT PROCEDURE H&P NOTE   Primary Care Physician: [name], MD  HPI: Theresa Harrington is a 63 y.o. female who presents for EGD with attempt at EMR of HGD nodular Barrett's.      Past Medical History:  Diagnosis Date  . Allergy   . Anemia   . Anxiety    Pt. denies  . Asymptomatic cholelithiasis   . Atherosclerosis of aorta ([place])   . Cataract   . Clotting disorder ([place])   . Dysrhythmia    PAF  . GERD (gastroesophageal reflux disease)   . History of kidney stones    lithotrispy  . Hyperlipidemia   . Hypertension   . Hypothyroidism    no meds  . PAD (peripheral artery disease) ([place])   . Peripheral arterial occlusive disease (HCC)    lower extremities  . Pneumonia   . PONV (postoperative nausea and vomiting)   . Right ureteral stone   . Type 2 diabetes mellitus (HCC)    Type  . Wears glasses         Past Surgical History:  Procedure Laterality Date  . AMPUTATION Left 07/16/2014   Procedure: LEFT SECOND TOE AMPUTATION;  Surgeon: Serafina Mitchell, MD;  Location: [facility];  Service: Vascular;  Laterality: Left;  With Nerve block  . AUGMENTATION MAMMAPLASTY    . BELPHAROPTOSIS REPAIR     eyelid lift  . COLONOSCOPY    . COMBINED AUGMENTATION MAMMAPLASTY AND ABDOMINOPLASTY  2009   W/  BILATERAL  THIGH LIFT  . CYSTO/  RIGHT URETERAL STENT PLACEMENT  12-30-2010  . CYSTOSCOPY WITH RETROGRADE PYELOGRAM, URETEROSCOPY AND STENT PLACEMENT Right 10/07/2013   Procedure: CYSTOSCOPY WITH RETROGRADE PYELOGRAM, right URETEROSCOPY AND STENT PLACEMENT, stone extraction;  Surgeon: Arvil Persons, MD;  Location: [facility];  Service: Urology;  Laterality: Right;  . CYSTOSCOPY WITH STENT PLACEMENT Right 06/04/2013   Procedure: CYSTOSCOPY WITH STENT PLACEMENT;  Surgeon: Franchot Gallo, MD;  Location: [facility];  Service: Urology;  Laterality: Right;  . DILATATION & CURETTAGE/HYSTEROSCOPY WITH MYOSURE N/A 04/27/2015    Procedure: DILATATION & CURETTAGE/HYSTEROSCOPY WITH MYOSURE;  Surgeon: Terrance Mass, MD;  Location: [facility];  Service: Gynecology;  Laterality: N/A;  . ENDARTERECTOMY FEMORAL Left 06/08/2014   Procedure: Left Leg Common Femoral and External Iliac  Endartarectomy with patch Angioplasty;  Surgeon: Rosetta Posner, MD;  Location: [facility];  Service: Vascular;  Laterality: Left;  . EXTRACORPOREAL SHOCK WAVE LITHOTRIPSY Right 08-04-2013//   06-23-2013//   01-16-2011  . EYE SURGERY Bilateral    cataract  . FEMORAL-POPLITEAL BYPASS GRAFT Left 06/08/2014   Procedure: Left Leg Femoral -Popliteal Bypass Graft;  Surgeon: Rosetta Posner, MD;  Location: [facility];  Service: Vascular;  Laterality: Left;  . FEMORAL-POPLITEAL BYPASS GRAFT Left 06/09/2014   Procedure: Left Femoral and Popliteal Exposure; Left Femoral to Anterior Tibial Bypass Graft using Propaten 37mm by 80cm Goretex Graft; Left Tibial Endarterectomy; Left Femoraland Popliteal Thrombectomy ;  Surgeon: Serafina Mitchell, MD;  Location: [place] [facility];  Service: Vascular;  Laterality: Left;  . HOLMIUM LASER APPLICATION Right 08/06/2498   Procedure: HOLMIUM LASER APPLICATION;  Surgeon: Arvil Persons, MD;  Location: [facility];  Service: Urology;  Laterality: Right;  . KIDNEY STONE SURGERY  April 2015   1-2 stones  . LAPAROSCOPIC GASTRIC BANDING  05-29-2005  . LITHOTRIPSY  2-3 times  . LOWER EXTREMITY ANGIOGRAM N/A 06/04/2014   Procedure: LOWER EXTREMITY ANGIOGRAM;  Surgeon: Serafina Mitchell, MD;  Location: [facility];  Service: Cardiovascular;  Laterality: N/A;  . ORIF FIFTH METACARPAL [place]  RIGHT HAND  04-21-2002  . REVISION AND RE-SITING LAP-BAND PORT  04-08-2010   W/  UPPER EGD  . RIGHT KNEE PATELLECTOMY W/ REPAIR OF EXTENSOR MECHANISM  04-14-2002  . Toenail removed Left Jan. 21, 2016   2nd toenail-  Dr. Barkley Bruns            Current Facility-Administered Medications  Medication Dose Route Frequency Provider Last Rate Last Dose  . lactated  ringers infusion   Intravenous Continuous Mansouraty, Telford Nab., MD            Allergies  Allergen Reactions  . Amlodipine Swelling  . Atorvastatin Other (See Comments)    Muscle aches   . Ampicillin Nausea And Vomiting and Other (See Comments)  . Codeine Nausea And Vomiting    Ask patient  . Lisinopril Cough       . Penicillins Nausea And Vomiting    Did it involve swelling of the face/tongue/throat, SOB, or low BP? No Did it involve sudden or severe rash/hives, skin peeling, or any reaction on the inside of your mouth or nose? No Did you need to seek medical attention at a hospital or doctor's office? No When did it last happen?20+ years If all above answers are "NO", may proceed with cephalosporin use.   [name] [Sitagliptin] Nausea And Vomiting        Family History  Problem Relation Age of Onset  . Diabetes Father   . Heart disease Father   . Deep vein thrombosis Father   . Hyperlipidemia Father   . Diabetes Sister   . Heart disease Sister   . Deep vein thrombosis Sister   . Hyperlipidemia Sister   . Diabetes Brother   . Heart disease Brother   . Cancer Brother   . Hyperlipidemia Brother   . Colon cancer Maternal Aunt   . Diabetes Brother   . Diabetes Brother   . Kidney disease Mother   . Hyperlipidemia Mother   . Other Mother        AAA   and    Amputation  . Arthritis Other   . Cancer Other        colon  . Hypertension Other   . Stroke Other   . Esophageal cancer Neg Hx   . Rectal cancer Neg Hx   . Stomach cancer Neg Hx    Social History        Socioeconomic History  . Marital status: Single    Spouse name: Not on file  . Number of children: Not on file  . Years of education: Not on file  . Highest education level: Not on file  Occupational History  . Occupation: Quarry manager: [employer]  . Financial resource strain: Not on file  . Food insecurity    Worry: Not  on file    Inability: Not on file  . Transportation needs    Medical: Not on file    Non-medical: Not on file  Tobacco Use  . Smoking status: Former Smoker    Packs/day: 1.00    Years: 28.00    Pack years: 28.00    Types: Cigarettes    Quit date: 09/05/2001    Years since quitting: 17.1  . Smokeless tobacco: Never Used  Substance and Sexual Activity  . Alcohol use: No    Alcohol/week: 0.0 standard drinks  .  Drug use: No  . Sexual activity: Yes  Lifestyle  . Physical activity    Days per week: Not on file    Minutes per session: Not on file  . Stress: Not on file  Relationships  . Social Herbalist on phone: Not on file    Gets together: Not on file    Attends religious service: Not on file    Active member of club or organization: Not on file    Attends meetings of clubs or organizations: Not on file    Relationship status: Not on file  . Intimate partner violence    Fear of current or ex partner: Not on file    Emotionally abused: Not on file    Physically abused: Not on file    Forced sexual activity: Not on file  Other Topics Concern  . Not on file  Social History Narrative   Regular exercise- yes    Physical Exam: Vital signs in last 24 hours: Temp:  [98.4 F (36.9 C)] 98.4 F (36.9 C) (06/15 0755) Pulse Rate:  [57] 57 (06/15 0755) Resp:  [16] 16 (06/15 0755) BP: (148)/(66) 148/66 (06/15 0755) SpO2:  [100 %] 100 % (06/15 0755) Weight:  [78.5 kg] 78.5 kg (06/15 0755)   GEN: NAD EYE: Sclerae anicteric CV: Without R/Gs  RESP: CTAB posteriorly GI: Soft, NT/ND NEURO:  Alert & Oriented x 3  Lab Results: Recent Labs (last 2 labs)   No results for input(s): WBC, HGB, HCT, PLT in the last 72 hours.   BMET Recent Labs (last 2 labs)   No results for input(s): NA, K, CL, CO2, GLUCOSE, BUN, CREATININE, CALCIUM in the last 72 hours.   LFT Recent Labs (last 2 labs)   No results for input(s): PROT,  ALBUMIN, AST, ALT, ALKPHOS, BILITOT, BILIDIR, IBILI in the last 72 hours.   PT/INR Recent Labs (last 2 labs)   No results for input(s): LABPROT, INR in the last 72 hours.     Impression / Plan: This is a 63 y.o.female who presents for EGD with attempt at EMR of HGD nodular Barrett's.  Based upon the description and endoscopic pictures I do feel that it is reasonable to pursue an Advanced Polypectomy attempt of the polyp/lesion.  We discussed some of the techniques of advanced polypectomy which include Endoscopic Mucosal Resection and Tissue Ablation via Fulguration.  The risks and benefits of endoscopic evaluation were discussed with the patient; these include but are not limited to the risk of perforation, infection, bleeding, missed lesions, lack of diagnosis, severe illness requiring hospitalization, as well as anesthesia and sedation related illnesses.  During attempts at advanced polypectomy, the risks of bleeding and perforation/leak are increased as opposed to diagnostic and screening colonoscopies, and that was discussed with the patient as well.   In addition, I explained that with the possible need for piecemeal resection, subsequent short-interval endoscopic evaluation for follow up and potential retreatment of the lesion/area may be necessary.  If, after attempt at removal of the polyp, it is found that the patient has a complication or that an invasive lesion or malignant lesion is found, or that the polyp continues to recur, the patient is aware and understands that surgery may still be indicated/required.  All patient questions were answered, to the best of my ability, and the patient agrees to the aforementioned plan of action with follow-up as indicated.   The risks and benefits of endoscopic evaluation were discussed with the patient;  these include but are not limited to the risk of perforation, infection, bleeding, missed lesions, lack of diagnosis, severe illness requiring  hospitalization, as well as anesthesia and sedation related illnesses.  The patient is agreeable to proceed.    Justice Britain, MD [facility] Advanced Endoscopy Office # [phone]

## 2018-10-14 NOTE — Op Note (Addendum)
[facility] Patient Name: Theresa Harrington Procedure Date : 10/14/2018 MRN: [id number] Attending MD: Gabriel Mansouraty , MD Date of Birth: 03/15/1956 CSN: [id number] Age: 62 Admit Type: Outpatient Procedure:                Upper GI endoscopy Indications:              For endoscopic therapy of Barrett's esophagus with                            high grade dysplasia Providers:                Gabriel Mansouraty, MD, Jenny Kappus, RN, Chris                            Chandler Tech., Technician, Janna Dongell, CRNA Referring MD:             Henry L. Danis, MD Medicines:                Monitored Anesthesia Care Complications:            No immediate complications. Estimated Blood Loss:     Estimated blood loss was minimal. Procedure:                Pre-Anesthesia Assessment:                           - Prior to the procedure, a History and Physical                            was performed, and patient medications and                            allergies were reviewed. The patient's tolerance of                            previous anesthesia was also reviewed. The risks                            and benefits of the procedure and the sedation                            options and risks were discussed with the patient.                            All questions were answered, and informed consent                            was obtained. Prior Anticoagulants: The patient has                            taken Xarelto (rivaroxaban), last dose was 3 days                            prior to procedure. ASA Grade Assessment: II - A                              patient with mild systemic disease. After reviewing                            the risks and benefits, the patient was deemed in                            satisfactory condition to undergo the procedure.                           After obtaining informed consent, the endoscope was                            passed under direct vision.  Throughout the                            procedure, the patient's blood pressure, pulse, and                            oxygen saturations were monitored continuously. The                            [id number] ([id number]) Olympus therapeutic                            gastroscope was introduced through the mouth, and                            advanced to the second part of duodenum. The upper                            GI endoscopy was accomplished without difficulty.                            The patient tolerated the procedure. Scope In: Scope Out: Findings:      No gross lesions were noted in the proximal esophagus.      Patchy mild mucosal changes characterized by altered texture and a       decreased vascular pattern were found in the mid esophagus. Biopsies       were taken with a cold forceps for histology to rule out esophagitis.       Not the typical pattern of reflux esophagitis however.      The esophagus and gastroesophageal junction were examined with white       light and narrow band imaging (NBI) from a forward view and retroflexed       position. There were esophageal mucosal changes consistent with       short-segment Barrett's esophagus (previously biopsied). These changes       involved the mucosa at the upper extent of the gastric folds (36 cm from       the incisors) extending to the Z-line (35 cm from the incisors). Two       tongues of salmon-colored mucosa were present from 33 to 35 cm. The       maximum longitudinal extent of these esophageal mucosal changes was 2 cm       in length. The previous  2 endoscopies showed evidence of nodular mucosa       in region and today's plan had been for an EMR solely. However, after       careful review and evaluation of the area, no evidence of mucosal       nodularity was found. Decision made to pursue biopsies. Mucosa was       biopsied with a cold forceps for histology in a targeted manner at       intervals of 1 cm from  33 to 35 cm from the incisors. A total of 3       specimen bottles were sent to pathology (35 cm/ 34 cm/ 33 cm).      A small hiatal hernia was present - through previously noted to be a       result of Lap-Band.      Striped moderately erythematous mucosa without bleeding was found in the       gastric antrum.      No other gross lesions were noted in the entire examined stomach.       Biopsies were taken with a cold forceps for histology and Helicobacter       pylori testing.      No gross lesions were noted in the duodenal bulb, in the first portion       of the duodenum and in the second portion of the duodenum. Impression:               - Texture changed, decreased vascular pattern                            mucosa in the esophagus. Biopsied to rule out                            esophagitis.                           - Esophageal mucosal changes consistent with                            short-segment Barrett's esophagus. Previous nodular                            Barrett's was not found. Insufflation and careful                            white-light and NBI did not show evidence of                            nodularity. EMR not performed. Biopsied for                            dysplasia evaluation.                           - Small hiatal hernia - secondary to Lap-band.                           - Erythematous mucosa in the antrum. Biopsied for                              HP.                           - No gross lesions in the duodenal bulb, in the                            first portion of the duodenum and in the second                            portion of the duodenum. Recommendation:           - The patient will be observed post-procedure,                            until all discharge criteria are met.                           - Discharge patient to home.                           - Patient has a contact number available for                            emergencies. The  signs and symptoms of potential                            delayed complications were discussed with the                            patient. Return to normal activities tomorrow.                            Written discharge instructions were provided to the                            patient.                           - Resume previous diet.                           - Increase PPI to BID.                           - Repeat upper endoscopy in 3-5 weeks (will need to                            schedule at [facility] as this is where equipment is) for                            follow-up of Barrett's and to proceed with Ablation                            via RFA. Will schedule with time, should new  changes be found that would require an EMR, but not                            likely.                           - OK to restart Xarelto in 48-hours to decrease                            risk of post-procedural bleeding.                           - The findings and recommendations were discussed                            with the patient. Procedure Code(s):        --- Professional ---                           [phone], Esophagogastroduodenoscopy, flexible,                            transoral; with biopsy, single or multiple Diagnosis Code(s):        --- Professional ---                           K22.8, Other specified diseases of esophagus                           K31.89, Other diseases of stomach and duodenum                           K44.9, Diaphragmatic hernia without obstruction or                            gangrene                           K22.711, Barrett's esophagus with high grade                            dysplasia CPT copyright 2019 American Medical Association. All rights reserved. The codes documented in this report are preliminary and upon coder review may  be revised to meet current compliance requirements. Justice Britain, MD 10/14/2018 11:06:20  AM Number of Addenda: 0

## 2018-10-14 NOTE — Anesthesia Postprocedure Evaluation (Signed)
Anesthesia Post Note  Patient: Theresa Harrington  Procedure(s) Performed: ESOPHAGOGASTRODUODENOSCOPY (EGD) (N/A ) BIOPSY     Patient location during evaluation: Endoscopy Anesthesia Type: MAC Level of consciousness: awake and alert, oriented and patient cooperative Pain management: pain level controlled Vital Signs Assessment: post-procedure vital signs reviewed and stable Respiratory status: spontaneous breathing, nonlabored ventilation and respiratory function stable Cardiovascular status: blood pressure returned to baseline and stable Postop Assessment: no apparent nausea or vomiting Anesthetic complications: no    Last Vitals:  Vitals:   10/14/18 1100 10/14/18 1110  BP: (!) 121/53 (!) 145/54  Pulse: (!) 50 (!) 51  Resp: (!) 22 15  Temp:    SpO2: 100% 98%    Last Pain:  Vitals:   10/14/18 1047  TempSrc: Axillary  PainSc: 0-No pain                 Riata Ikeda,E. Aryanah Enslow

## 2018-10-14 NOTE — Telephone Encounter (Signed)
Dr Loletha Carrow would you like this pt seen by you or Dr Rush Landmark.  Please advise.

## 2018-10-14 NOTE — Consult Note (Deleted)
GASTROENTEROLOGY OUTPATIENT PROCEDURE H&P NOTE   Primary Care Physician: [name], MD  HPI: Theresa Harrington is a 63 y.o. female who presents for EGD with attempt at EMR of HGD nodular Barrett's.  Past Medical History:  Diagnosis Date  . Allergy   . Anemia   . Anxiety    Pt. denies  . Asymptomatic cholelithiasis   . Atherosclerosis of aorta ([place])   . Cataract   . Clotting disorder ([place])   . Dysrhythmia    PAF  . GERD (gastroesophageal reflux disease)   . History of kidney stones    lithotrispy  . Hyperlipidemia   . Hypertension   . Hypothyroidism    no meds  . PAD (peripheral artery disease) ([place])   . Peripheral arterial occlusive disease (HCC)    lower extremities  . Pneumonia   . PONV (postoperative nausea and vomiting)   . Right ureteral stone   . Type 2 diabetes mellitus (HCC)    Type  . Wears glasses    Past Surgical History:  Procedure Laterality Date  . AMPUTATION Left 07/16/2014   Procedure: LEFT SECOND TOE AMPUTATION;  Surgeon: Serafina Mitchell, MD;  Location: [facility];  Service: Vascular;  Laterality: Left;  With Nerve block  . AUGMENTATION MAMMAPLASTY    . BELPHAROPTOSIS REPAIR     eyelid lift  . COLONOSCOPY    . COMBINED AUGMENTATION MAMMAPLASTY AND ABDOMINOPLASTY  2009   W/  BILATERAL  THIGH LIFT  . CYSTO/  RIGHT URETERAL STENT PLACEMENT  12-30-2010  . CYSTOSCOPY WITH RETROGRADE PYELOGRAM, URETEROSCOPY AND STENT PLACEMENT Right 10/07/2013   Procedure: CYSTOSCOPY WITH RETROGRADE PYELOGRAM, right URETEROSCOPY AND STENT PLACEMENT, stone extraction;  Surgeon: Arvil Persons, MD;  Location: [facility];  Service: Urology;  Laterality: Right;  . CYSTOSCOPY WITH STENT PLACEMENT Right 06/04/2013   Procedure: CYSTOSCOPY WITH STENT PLACEMENT;  Surgeon: Franchot Gallo, MD;  Location: [facility];  Service: Urology;  Laterality: Right;  . DILATATION & CURETTAGE/HYSTEROSCOPY WITH MYOSURE N/A 04/27/2015   Procedure: DILATATION & CURETTAGE/HYSTEROSCOPY WITH  MYOSURE;  Surgeon: Terrance Mass, MD;  Location: [facility];  Service: Gynecology;  Laterality: N/A;  . ENDARTERECTOMY FEMORAL Left 06/08/2014   Procedure: Left Leg Common Femoral and External Iliac  Endartarectomy with patch Angioplasty;  Surgeon: Rosetta Posner, MD;  Location: [facility];  Service: Vascular;  Laterality: Left;  . EXTRACORPOREAL SHOCK WAVE LITHOTRIPSY Right 08-04-2013//   06-23-2013//   01-16-2011  . EYE SURGERY Bilateral    cataract  . FEMORAL-POPLITEAL BYPASS GRAFT Left 06/08/2014   Procedure: Left Leg Femoral -Popliteal Bypass Graft;  Surgeon: Rosetta Posner, MD;  Location: [facility];  Service: Vascular;  Laterality: Left;  . FEMORAL-POPLITEAL BYPASS GRAFT Left 06/09/2014   Procedure: Left Femoral and Popliteal Exposure; Left Femoral to Anterior Tibial Bypass Graft using Propaten 62mm by 80cm Goretex Graft; Left Tibial Endarterectomy; Left Femoraland Popliteal Thrombectomy ;  Surgeon: Serafina Mitchell, MD;  Location: [facility];  Service: Vascular;  Laterality: Left;  . HOLMIUM LASER APPLICATION Right 08/31/8411   Procedure: HOLMIUM LASER APPLICATION;  Surgeon: Arvil Persons, MD;  Location: [facility];  Service: Urology;  Laterality: Right;  . KIDNEY STONE SURGERY  April 2015   1-2 stones  . LAPAROSCOPIC GASTRIC BANDING  05-29-2005  . LITHOTRIPSY  2-3 times  . LOWER EXTREMITY ANGIOGRAM N/A 06/04/2014   Procedure: LOWER EXTREMITY ANGIOGRAM;  Surgeon: Serafina Mitchell, MD;  Location: [facility];  Service: Cardiovascular;  Laterality: N/A;  . ORIF FIFTH METACARPAL [place]  RIGHT HAND  04-21-2002  . REVISION AND RE-SITING LAP-BAND PORT  04-08-2010   W/  UPPER EGD  . RIGHT KNEE PATELLECTOMY W/ REPAIR OF EXTENSOR MECHANISM  04-14-2002  . Toenail removed Left Jan. 21, 2016   2nd toenail-  Dr. Barkley Bruns   Current Facility-Administered Medications  Medication Dose Route Frequency Provider Last Rate Last Dose  . lactated ringers infusion   Intravenous Continuous Mansouraty, Telford Nab., MD        Allergies  Allergen Reactions  . Amlodipine Swelling  . Atorvastatin Other (See Comments)    Muscle aches   . Ampicillin Nausea And Vomiting and Other (See Comments)  . Codeine Nausea And Vomiting    Ask patient  . Lisinopril Cough       . Penicillins Nausea And Vomiting    Did it involve swelling of the face/tongue/throat, SOB, or low BP? No Did it involve sudden or severe rash/hives, skin peeling, or any reaction on the inside of your mouth or nose? No Did you need to seek medical attention at a hospital or doctor's office? No When did it last happen?20+ years If all above answers are "NO", may proceed with cephalosporin use.   [name] [Sitagliptin] Nausea And Vomiting   Family History  Problem Relation Age of Onset  . Diabetes Father   . Heart disease Father   . Deep vein thrombosis Father   . Hyperlipidemia Father   . Diabetes Sister   . Heart disease Sister   . Deep vein thrombosis Sister   . Hyperlipidemia Sister   . Diabetes Brother   . Heart disease Brother   . Cancer Brother   . Hyperlipidemia Brother   . Colon cancer Maternal Aunt   . Diabetes Brother   . Diabetes Brother   . Kidney disease Mother   . Hyperlipidemia Mother   . Other Mother        AAA   and    Amputation  . Arthritis Other   . Cancer Other        colon  . Hypertension Other   . Stroke Other   . Esophageal cancer Neg Hx   . Rectal cancer Neg Hx   . Stomach cancer Neg Hx    Social History   Socioeconomic History  . Marital status: Single    Spouse name: Not on file  . Number of children: Not on file  . Years of education: Not on file  . Highest education level: Not on file  Occupational History  . Occupation: Quarry manager: [employer]  . Financial resource strain: Not on file  . Food insecurity    Worry: Not on file    Inability: Not on file  . Transportation needs    Medical: Not on file    Non-medical: Not on file  Tobacco Use  . Smoking  status: Former Smoker    Packs/day: 1.00    Years: 28.00    Pack years: 28.00    Types: Cigarettes    Quit date: 09/05/2001    Years since quitting: 17.1  . Smokeless tobacco: Never Used  Substance and Sexual Activity  . Alcohol use: No    Alcohol/week: 0.0 standard drinks  . Drug use: No  . Sexual activity: Yes  Lifestyle  . Physical activity    Days per week: Not on file    Minutes per session: Not on file  .  Stress: Not on file  Relationships  . Social Herbalist on phone: Not on file    Gets together: Not on file    Attends religious service: Not on file    Active member of club or organization: Not on file    Attends meetings of clubs or organizations: Not on file    Relationship status: Not on file  . Intimate partner violence    Fear of current or ex partner: Not on file    Emotionally abused: Not on file    Physically abused: Not on file    Forced sexual activity: Not on file  Other Topics Concern  . Not on file  Social History Narrative   Regular exercise- yes    Physical Exam: Vital signs in last 24 hours: Temp:  [98.4 F (36.9 C)] 98.4 F (36.9 C) (06/15 0755) Pulse Rate:  [57] 57 (06/15 0755) Resp:  [16] 16 (06/15 0755) BP: (148)/(66) 148/66 (06/15 0755) SpO2:  [100 %] 100 % (06/15 0755) Weight:  [78.5 kg] 78.5 kg (06/15 0755)   GEN: NAD EYE: Sclerae anicteric CV: Without R/Gs  RESP: CTAB posteriorly GI: Soft, NT/ND NEURO:  Alert & Oriented x 3  Lab Results: No results for input(s): WBC, HGB, HCT, PLT in the last 72 hours. BMET No results for input(s): NA, K, CL, CO2, GLUCOSE, BUN, CREATININE, CALCIUM in the last 72 hours. LFT No results for input(s): PROT, ALBUMIN, AST, ALT, ALKPHOS, BILITOT, BILIDIR, IBILI in the last 72 hours. PT/INR No results for input(s): LABPROT, INR in the last 72 hours.   Impression / Plan: This is a 63 y.o.female who presents for EGD with attempt at EMR of HGD nodular Barrett's.  Based upon the  description and endoscopic pictures I do feel that it is reasonable to pursue an Advanced Polypectomy attempt of the polyp/lesion.  We discussed some of the techniques of advanced polypectomy which include Endoscopic Mucosal Resection and Tissue Ablation via Fulguration.  The risks and benefits of endoscopic evaluation were discussed with the patient; these include but are not limited to the risk of perforation, infection, bleeding, missed lesions, lack of diagnosis, severe illness requiring hospitalization, as well as anesthesia and sedation related illnesses.  During attempts at advanced polypectomy, the risks of bleeding and perforation/leak are increased as opposed to diagnostic and screening colonoscopies, and that was discussed with the patient as well.   In addition, I explained that with the possible need for piecemeal resection, subsequent short-interval endoscopic evaluation for follow up and potential retreatment of the lesion/area may be necessary.  If, after attempt at removal of the polyp, it is found that the patient has a complication or that an invasive lesion or malignant lesion is found, or that the polyp continues to recur, the patient is aware and understands that surgery may still be indicated/required.  All patient questions were answered, to the best of my ability, and the patient agrees to the aforementioned plan of action with follow-up as indicated.   The risks and benefits of endoscopic evaluation were discussed with the patient; these include but are not limited to the risk of perforation, infection, bleeding, missed lesions, lack of diagnosis, severe illness requiring hospitalization, as well as anesthesia and sedation related illnesses.  The patient is agreeable to proceed.    Justice Britain, MD [facility] Advanced Endoscopy Office # [phone]

## 2018-10-14 NOTE — Transfer of Care (Signed)
Immediate Anesthesia Transfer of Care Note  Patient: Theresa Harrington  Procedure(s) Performed: ESOPHAGOGASTRODUODENOSCOPY (EGD) (N/A ) BIOPSY  Patient Location: Endoscopy Unit  Anesthesia Type:MAC  Level of Consciousness: drowsy  Airway & Oxygen Therapy: Patient Spontanous Breathing and Patient connected to nasal cannula oxygen  Post-op Assessment: Report given to RN and Post -op Vital signs reviewed and stable  Post vital signs: Reviewed and stable  Last Vitals:  Vitals Value Taken Time  BP    Temp    Pulse    Resp    SpO2      Last Pain:  Vitals:   10/14/18 0755  TempSrc: Oral  PainSc: 0-No pain         Complications: No apparent anesthesia complications

## 2018-10-14 NOTE — Anesthesia Preprocedure Evaluation (Signed)
Anesthesia Evaluation  Patient identified by MRN, date of birth, ID band Patient awake    Reviewed: Allergy & Precautions, NPO status , Patient's Chart, lab work & pertinent test results  History of Anesthesia Complications (+) PONV  Airway Mallampati: II  TM Distance: >3 FB Neck ROM: Full    Dental  (+) Dental Advisory Given   Pulmonary former smoker (quit 2003),  10/10/2018 SARS coronavirus NEG   breath sounds clear to auscultation       Cardiovascular hypertension, Pt. on medications + Peripheral Vascular Disease  + dysrhythmias Atrial Fibrillation  Rhythm:Irregular Rate:Normal  '18 ECHO: EF 55-60%, mild MS, trivial MR   Neuro/Psych Anxiety    GI/Hepatic GERD  Medicated and Controlled,  Endo/Other  diabetes, Insulin Dependent, Oral Hypoglycemic Agents  Renal/GU      Musculoskeletal   Abdominal   Peds  Hematology Xarelto: last dose friday   Anesthesia Other Findings   Reproductive/Obstetrics                             Anesthesia Physical Anesthesia Plan  ASA: III  Anesthesia Plan: MAC   Post-op Pain Management:    Induction:   PONV Risk Score and Plan: 3 and Ondansetron, Dexamethasone and Treatment may vary due to age or medical condition  Airway Management Planned: Natural Airway and Nasal Cannula  Additional Equipment:   Intra-op Plan:   Post-operative Plan:   Informed Consent: I have reviewed the patients History and Physical, chart, labs and discussed the procedure including the risks, benefits and alternatives for the proposed anesthesia with the patient or authorized representative who has indicated his/her understanding and acceptance.     Dental advisory given  Plan Discussed with: CRNA and Surgeon  Anesthesia Plan Comments:         Anesthesia Quick Evaluation

## 2018-10-14 NOTE — Discharge Instructions (Signed)
YOU HAD AN ENDOSCOPIC PROCEDURE TODAY: Refer to the procedure report and other information in the discharge instructions given to you for any specific questions about what was found during the examination. If this information does not answer your questions, please call [place] office at [phone] to clarify.   YOU SHOULD EXPECT: Some feelings of bloating in the abdomen. Passage of more gas than usual. Walking can help get rid of the air that was put into your GI tract during the procedure and reduce the bloating. If you had a lower endoscopy (such as a colonoscopy or flexible sigmoidoscopy) you may notice spotting of blood in your stool or on the toilet paper. Some abdominal soreness may be present for a day or two, also.  DIET: Your first meal following the procedure should be a light meal and then it is ok to progress to your normal diet. A half-sandwich or bowl of soup is an example of a good first meal. Heavy or fried foods are harder to digest and may make you feel nauseous or bloated. Drink plenty of fluids but you should avoid alcoholic beverages for 24 hours. If you had a esophageal dilation, please see attached instructions for diet.    ACTIVITY: Your care partner should take you home directly after the procedure. You should plan to take it easy, moving slowly for the rest of the day. You can resume normal activity the day after the procedure however YOU SHOULD NOT DRIVE, use power tools, machinery or perform tasks that involve climbing or major physical exertion for 24 hours (because of the sedation medicines used during the test).   SYMPTOMS TO REPORT IMMEDIATELY: A gastroenterologist can be reached at any hour. Please call [phone]  for any of the following symptoms:   Following upper endoscopy (EGD, EUS, ERCP, esophageal dilation) Vomiting of blood or coffee ground material  New, significant abdominal pain  New, significant chest pain or pain under the shoulder blades  Painful or  persistently difficult swallowing  New shortness of breath  Black, tarry-looking or red, bloody stools  FOLLOW UP:  If any biopsies were taken you will be contacted by phone or by letter within the next 1-3 weeks. Call [phone]  if you have not heard about the biopsies in 3 weeks.  Please also call with any specific questions about appointments or follow up tests.  

## 2018-10-14 NOTE — Telephone Encounter (Signed)
Gabe,  Thank you very much for the update on this complex patient.  I am glad to see that an EMR was not necessary, but agree she needs RFA.  As I do not perform RFA, I think it would be best if you have a conversation with her about it to review the procedure in detail along with risks and benefits.

## 2018-10-14 NOTE — Telephone Encounter (Signed)
-----   Message from Irving Copas., MD sent at 10/14/2018  1:50 PM EDT ----- [name], The nodularity is no longer present at this time.  Spent a long time looking at things with [facility] and with [place].   No EMR necessary today. Biopsies retaken, and some gastric biopsies as well. I increased her PPI to BID for now. I would like to proceed with RFA of the Short-Segment Barrett's in the next 3-5 weeks. If you are OK with that, please let me know. Axton Cihlar, please start looking for an EGD 90 minute slot at [facility] for Barrett's RFA. I would also like to do a Telemedicine visit, or if you like [name] you can, about the role of RFA and some of the risks associated with it. Thanks. GM

## 2018-10-15 ENCOUNTER — Other Ambulatory Visit: Payer: Self-pay

## 2018-10-15 DIAGNOSIS — K22711 Barrett's esophagus with high grade dysplasia: Secondary | ICD-10-CM

## 2018-10-15 NOTE — Telephone Encounter (Signed)
EGD Dr Jerilynn Mages on 11/06/18 at 10 am [facility]  Covid testing on 11/01/18  ROV with Dr Jerilynn Mages on 11/05/18 at 61 am via telephone   The patient has been notified of this information and all questions answered. The pt has been advised of the information and verbalized understanding.

## 2018-10-15 NOTE — Telephone Encounter (Signed)
No problem. Patty can you set up a telehealth visit with me in the coming weeks? Would do before the procedure date. Thanks. GM

## 2018-10-15 NOTE — Telephone Encounter (Signed)
Left message on machine to call back  

## 2018-10-17 DIAGNOSIS — E1151 Type 2 diabetes mellitus with diabetic peripheral angiopathy without gangrene: Secondary | ICD-10-CM | POA: Diagnosis not present

## 2018-10-17 DIAGNOSIS — E11621 Type 2 diabetes mellitus with foot ulcer: Secondary | ICD-10-CM | POA: Diagnosis not present

## 2018-10-17 DIAGNOSIS — L97522 Non-pressure chronic ulcer of other part of left foot with fat layer exposed: Secondary | ICD-10-CM | POA: Diagnosis not present

## 2018-10-17 DIAGNOSIS — Z89422 Acquired absence of other left toe(s): Secondary | ICD-10-CM | POA: Diagnosis not present

## 2018-10-18 ENCOUNTER — Telehealth: Payer: Self-pay

## 2018-10-18 ENCOUNTER — Encounter: Payer: Self-pay | Admitting: Gastroenterology

## 2018-10-18 NOTE — Telephone Encounter (Signed)
lvm for pt,   I need to know if she is needing an rx for test strips or if she is needing [place] to send to different pharmacy. If we need to send in test strips which kind is needed.

## 2018-10-18 NOTE — Telephone Encounter (Signed)
Copied from [place] [id number]. Topic: General - Call Back - No Documentation >> Oct 18, 2018  9:06 AM Erick Blinks wrote: Reason for CRM: Pt called to state that pharmacy does not have test strips for her. Please advise  [phone]

## 2018-10-23 MED ORDER — GLUCOSE BLOOD VI STRP: ORAL_STRIP | 3 refills | Status: DC

## 2018-10-23 NOTE — Telephone Encounter (Signed)
Pt informed rx was sent in.  

## 2018-10-31 DIAGNOSIS — E1151 Type 2 diabetes mellitus with diabetic peripheral angiopathy without gangrene: Secondary | ICD-10-CM | POA: Diagnosis not present

## 2018-10-31 DIAGNOSIS — Z89411 Acquired absence of right great toe: Secondary | ICD-10-CM | POA: Diagnosis not present

## 2018-10-31 DIAGNOSIS — L97522 Non-pressure chronic ulcer of other part of left foot with fat layer exposed: Secondary | ICD-10-CM | POA: Diagnosis not present

## 2018-11-01 ENCOUNTER — Other Ambulatory Visit (HOSPITAL_COMMUNITY): Admission: RE | Admit: 2018-11-01 | Payer: PPO | Source: Ambulatory Visit

## 2018-11-04 ENCOUNTER — Encounter (HOSPITAL_COMMUNITY): Payer: Self-pay

## 2018-11-04 ENCOUNTER — Other Ambulatory Visit: Payer: Self-pay

## 2018-11-04 ENCOUNTER — Other Ambulatory Visit (HOSPITAL_COMMUNITY)
Admission: RE | Admit: 2018-11-04 | Discharge: 2018-11-04 | Disposition: A | Discharge status: Home / Self Care | Payer: PPO | Source: Ambulatory Visit | Attending: Gastroenterology | Admitting: Gastroenterology

## 2018-11-04 DIAGNOSIS — Z1159 Encounter for screening for other viral diseases: Secondary | ICD-10-CM | POA: Insufficient documentation

## 2018-11-04 DIAGNOSIS — Z01812 Encounter for preprocedural laboratory examination: Secondary | ICD-10-CM | POA: Insufficient documentation

## 2018-11-04 LAB — SARS CORONAVIRUS 2 (TAT 6-24 HRS): SARS Coronavirus 2: NEGATIVE

## 2018-11-04 NOTE — Progress Notes (Signed)
SPOKE W/  Kenetha     SCREENING SYMPTOMS OF COVID 19:   COUGH--NO  RUNNY NOSE--- NO  SORE THROAT---NO  NASAL CONGESTION----NO  SNEEZING----NO  SHORTNESS OF BREATH---NO  DIFFICULTY BREATHING---NO  TEMP >100.0 -----NO  UNEXPLAINED BODY ACHES------NO  CHILLS -------- NO HEADACHES ---------No  LOSS OF SMELL/ TASTE --------NO    HAVE YOU OR ANY FAMILY MEMBER TRAVELLED PAST 14 DAYS OUT OF THE   COUNTY---NO STATE----NO COUNTRY----No  HAVE YOU OR ANY FAMILY MEMBER BEEN EXPOSED TO ANYONE WITH COVID 19? No

## 2018-11-05 ENCOUNTER — Ambulatory Visit (INDEPENDENT_AMBULATORY_CARE_PROVIDER_SITE_OTHER): Payer: PPO | Admitting: Gastroenterology

## 2018-11-05 VITALS — Ht 65.0 in | Wt 173.0 lb

## 2018-11-05 DIAGNOSIS — Z7901 Long term (current) use of anticoagulants: Secondary | ICD-10-CM

## 2018-11-05 DIAGNOSIS — Z8719 Personal history of other diseases of the digestive system: Secondary | ICD-10-CM | POA: Diagnosis not present

## 2018-11-05 DIAGNOSIS — K22711 Barrett's esophagus with high grade dysplasia: Secondary | ICD-10-CM

## 2018-11-05 MED ORDER — SUCRALFATE 1 GM/10ML PO SUSP: 1.0000 g | Freq: Four times a day (QID) | ORAL | 1 refills | Status: DC

## 2018-11-05 MED ORDER — HYDROCODONE-ACETAMINOPHEN 7.5-325 MG/15ML PO SOLN: 10.0000 mL | Freq: Four times a day (QID) | ORAL | 0 refills | Status: DC | PRN

## 2018-11-05 NOTE — H&P (View-Only) (Signed)
° °GASTROENTEROLOGY OUTPATIENT CLINIC VISIT  ° °Primary Care Provider °Jones, Thomas L, MD °[address]ST [address]LO[address] °[place] [address] °[phone] ° °Patient Profile: °Theresa Harrington is a 62 y.o. female with a pmh significant for Barrett's esophagus (high-grade dysplasia and indeterminate dysplasia), GERD with esophagitis, hypertension, hyperlipidemia, peripheral arterial disease, paroxysmal atrial fibrillation (on anticoagulation), diabetes, B12 deficiency.  The patient presents to the [facility] for an evaluation and management of problem(s) noted below: ° °Problem List °1. Barrett's esophagus with high grade dysplasia   °2. Current use of long term anticoagulation   °3. History of esophagitis   ° ° °I connected with  Theresa Harrington on 11/05/18. °I verified that I was speaking with the correct person using two identifiers. °Due to the COVID-19 Pandemic, this service was provided via telemedicine using Audio-Only. °Interactive audio and video telecommunications were attempted between this provider and patient, however failed, due to patient not having access to video capability and thus to provide timely and excellent care, we continued and completed visit with audio only. °The patient was located at home. °The provider was located in the office. °The patient did consent to this visit and is aware of charges through their insurance as well as the limitations of evaluation and management by telemedicine. °Other persons participating in this telemedicine service were none. °Time spent on visit was 25 minutes in discussion and in coordination of care. ° ° °History of Present Illness °This is a patient that was initially referred to me by Dr. Danis for consideration of EMR for high-grade dysplasia found in Barrett's esophagus on 2 separate endoscopies.  I recently performed an EGD and found no further evidence of nodularity in the esophagus and repeat biopsies were obtained showing evidence of  indeterminate dysplasia.  Esophagitis had healed as well.  The patient has done well and is following up today so that we can discuss consideration of radiofrequency ablation for Barrett's esophagus.  Overall, the patient is doing well.  She denies any significant dysphagia.  No chest pain or chest discomfort.  No abdominal pains currently. ° °GI Review of Systems °Positive as above °Negative for odynophagia, nausea, vomiting, bloating, change in bowel habits, melena, hematochezia ° °Review of Systems °General: Denies fevers/chills/weight loss °HEENT: Denies oral lesions °Cardiovascular: Denies chest pain °Pulmonary: Denies shortness of breath °Gastroenterological: See HPI °Genitourinary: Denies darkened urine °Hematological: Denies easy bruising/bleeding °Dermatological: Denies jaundice °Psychological: Mood is stable ° ° °Medications °Current Outpatient Medications  °Medication Sig Dispense Refill  °• Blood Glucose Monitoring Suppl (ONETOUCH VERIO IQ SYSTEM) w/Device KIT USE TO CHECK SUGAR DAILY 1 kit 0  °• CARTIA XT 120 MG 24 hr capsule Take 1 capsule by mouth once daily (Patient taking differently: Take 120 mg by mouth daily. ) 90 capsule 0  °• ezetimibe (ZETIA) 10 MG tablet Take 10 mg by mouth at bedtime.     °• glucose blood test strip Use to check blood sugar twice daily. DX: E11.8 200 each 3  °• HYDROcodone-acetaminophen (HYCET) 7.5-325 mg/15 ml solution Take 10 mLs by mouth 4 (four) times daily as needed for moderate pain. 120 mL 0  °• insulin aspart (NOVOLOG) 100 UNIT/ML injection Inject 14-20 Units into the skin 3 (three) times daily before meals.    °• insulin detemir (LEVEMIR) 100 UNIT/ML injection Inject 80 Units into the skin at bedtime. Use until gone then switch to Basaglar    °• Insulin Glargine (BASAGLAR KWIKPEN) 100 UNIT/ML SOPN Inject 0.8 mLs (80 Units total)   total) into the skin daily. 9 mL 1   Insulin Pen Needle (PEN NEEDLES) 32G X 4 MM MISC Inject 1 pen as directed 4 (four) times daily. Use to  inject levemir or novolog as directed. 400 each 3   latanoprost (XALATAN) 0.005 % ophthalmic solution Place 1 drop into both eyes at bedtime.      losartan-hydrochlorothiazide (HYZAAR) 100-12.5 MG tablet Take 1 tablet by mouth once daily (Patient taking differently: Take 1 tablet by mouth daily. ) 90 tablet 1   magnesium oxide (MAG-OX) 400 (241.3 Mg) MG tablet TAKE 1 TABLET BY MOUTH ONCE DAILY (Patient taking differently: Take 400 mg by mouth daily. ) 90 tablet 1   metFORMIN (GLUCOPHAGE) 1000 MG tablet TAKE 1 TABLET BY MOUTH TWICE DAILY WITH MEALS (Patient taking differently: Take 1,000 mg by mouth 2 (two) times a day. ) 180 tablet 0   omeprazole (PRILOSEC) 40 MG capsule Take 1 capsule (40 mg total) by mouth 2 (two) times daily before a meal. 60 capsule 3   potassium chloride SA (K-DUR) 20 MEQ tablet Take 1 tablet (20 mEq total) by mouth 2 (two) times daily. 180 tablet 1   pravastatin (PRAVACHOL) 40 MG tablet Take 1 tablet (40 mg total) by mouth daily. (Patient taking differently: Take 40 mg by mouth at bedtime. ) 90 tablet 3   rivaroxaban (XARELTO) 20 MG TABS tablet Take 1 tablet (20 mg total) by mouth daily with supper. (Patient taking differently: Take 20 mg by mouth daily. ) 90 tablet 3   sucralfate (CARAFATE) 1 GM/10ML suspension Take 10 mLs (1 g total) by mouth 4 (four) times daily. 420 mL 1   Thiamine HCl (VITAMIN B1) 100 MG TABS Take 1 tablet by mouth once daily (Patient taking differently: Take 100 mg by mouth at bedtime. ) 90 tablet 0   TRULICITY 8.29 HB/7.1IR SOPN PLEASE START INJECTING 0.75MG UNDER THE SKIN WEEKLY (Patient taking differently: Inject 0.75 mg as directed every Monday. ) 4 mL 0   No current facility-administered medications for this visit.     Allergies Allergies  Allergen Reactions   Amlodipine Swelling   Atorvastatin Other (See Comments)    Muscle aches    Ampicillin Nausea And Vomiting and Other (See Comments)   Codeine Nausea And Vomiting    Ask  patient   Lisinopril Cough        Penicillins Nausea And Vomiting    Did it involve swelling of the face/tongue/throat, SOB, or low BP? No Did it involve sudden or severe rash/hives, skin peeling, or any reaction on the inside of your mouth or nose? No Did you need to seek medical attention at a hospital or doctor's office? No When did it last happen?20+ years If all above answers are NO, may proceed with cephalosporin use.    Januvia [Sitagliptin] Nausea And Vomiting    Histories Past Medical History:  Diagnosis Date   Allergy    Anxiety    Pt. denies   Asymptomatic cholelithiasis    Atherosclerosis of aorta (HCC)    Cataract    Clotting disorder ([place])    Gallstones 06/2013   GERD (gastroesophageal reflux disease)    History of kidney stones    lithotrispy   Hyperlipidemia    Hypertension    Hypothyroidism    no meds   Iron deficiency anemia    PAD (peripheral artery disease) (HCC)    PAF (paroxysmal atrial fibrillation) ([place])    Peripheral arterial occlusive disease ([place])  extremities  °• Pneumonia   °• PONV (postoperative nausea and vomiting)   °• Right ureteral stone   °• Type 2 diabetes mellitus (HCC)   ° Type  °• Vitamin B 12 deficiency   °• Wears glasses   ° °Past Surgical History:  °Procedure Laterality Date  °• AMPUTATION Left 07/16/2014  ° Procedure: LEFT SECOND TOE AMPUTATION;  Surgeon: Vance W Brabham, MD;  Location: [facility];  Service: Vascular;  Laterality: Left;  With Nerve block  °• AUGMENTATION MAMMAPLASTY    °• BELPHAROPTOSIS REPAIR    ° eyelid lift  °• BIOPSY  10/14/2018  ° Procedure: BIOPSY;  Surgeon: Mansouraty, Shastina Rua Jr., MD;  Location: [facility];  Service: Gastroenterology;;  °• COLONOSCOPY    °• COMBINED AUGMENTATION MAMMAPLASTY AND ABDOMINOPLASTY  2009  ° W/  BILATERAL  THIGH LIFT  °• CYSTO/  RIGHT URETERAL STENT PLACEMENT  12-30-2010  °• CYSTOSCOPY WITH RETROGRADE PYELOGRAM, URETEROSCOPY AND STENT PLACEMENT Right 10/07/2013   ° Procedure: CYSTOSCOPY WITH RETROGRADE PYELOGRAM, right URETEROSCOPY AND STENT PLACEMENT, stone extraction;  Surgeon: Marc H Nesi, MD;  Location: [facility];  Service: Urology;  Laterality: Right;  °• CYSTOSCOPY WITH STENT PLACEMENT Right 06/04/2013  ° Procedure: CYSTOSCOPY WITH STENT PLACEMENT;  Surgeon: Stephen Dahlstedt, MD;  Location: [facility];  Service: Urology;  Laterality: Right;  °• DILATATION & CURETTAGE/HYSTEROSCOPY WITH MYOSURE N/A 04/27/2015  ° Procedure: DILATATION & CURETTAGE/HYSTEROSCOPY WITH MYOSURE;  Surgeon: Juan H Fernandez, MD;  Location: [facility];  Service: Gynecology;  Laterality: N/A;  °• ENDARTERECTOMY FEMORAL Left 06/08/2014  ° Procedure: Left Leg Common Femoral and External Iliac  Endartarectomy with patch Angioplasty;  Surgeon: Todd F Early, MD;  Location: [facility];  Service: Vascular;  Laterality: Left;  °• ESOPHAGOGASTRODUODENOSCOPY N/A 10/14/2018  ° Procedure: ESOPHAGOGASTRODUODENOSCOPY (EGD);  Surgeon: Mansouraty, Trinda Harlacher Jr., MD;  Location: [facility];  Service: Gastroenterology;  Laterality: N/A;  °• EXTRACORPOREAL SHOCK WAVE LITHOTRIPSY Right 08-04-2013//   06-23-2013//   01-16-2011  °• EYE SURGERY Bilateral   ° cataract  °• FEMORAL-POPLITEAL BYPASS GRAFT Left 06/08/2014  ° Procedure: Left Leg Femoral -Popliteal Bypass Graft;  Surgeon: Todd F Early, MD;  Location: [facility];  Service: Vascular;  Laterality: Left;  °• FEMORAL-POPLITEAL BYPASS GRAFT Left 06/09/2014  ° Procedure: Left Femoral and Popliteal Exposure; Left Femoral to Anterior Tibial Bypass Graft using Propaten 6mm by 80cm Goretex Graft; Left Tibial Endarterectomy; Left Femoraland Popliteal Thrombectomy ;  Surgeon: Vance W Brabham, MD;  Location: [facility];  Service: Vascular;  Laterality: Left;  °• HOLMIUM LASER APPLICATION Right 10/07/2013  ° Procedure: HOLMIUM LASER APPLICATION;  Surgeon: Marc H Nesi, MD;  Location:  SURGERY CENTER;  Service: Urology;  Laterality: Right;  °• KIDNEY STONE SURGERY  April 2015  ° 1-2  stones  °• LAPAROSCOPIC GASTRIC BANDING  05-29-2005  °• LITHOTRIPSY  2-3 times  °• LOWER EXTREMITY ANGIOGRAM N/A 06/04/2014  ° Procedure: LOWER EXTREMITY ANGIOGRAM;  Surgeon: Vance W Brabham, MD;  [facility]: [facility];  Service: Cardiovascular;  Laterality: N/A;  °• ORIF FIFTH METACARPAL FX  RIGHT HAND  04-21-2002  °• REVISION AND RE-SITING LAP-BAND PORT  04-08-2010  ° W/  UPPER EGD  °• RIGHT KNEE PATELLECTOMY W/ REPAIR OF EXTENSOR MECHANISM  04-14-2002  °• Toenail removed Left Jan. 21, 2016  ° 2nd toenail-  Dr. Petery  ° °Social History  ° °Socioeconomic History  °• Marital status: Single  °  Spouse name: Not on file  °• Number of children: Not on   file  °• Years of education: Not on file  °• Highest education level: Not on file  °Occupational History  °• Occupation: tester  °  Employer: [employer]  °Social Needs  °• Financial resource strain: Not on file  °• Food insecurity  °  Worry: Not on file  °  Inability: Not on file  °• Transportation needs  °  Medical: Not on file  °  Non-medical: Not on file  °Tobacco Use  °• Smoking status: Former Smoker  °  Packs/day: 1.00  °  Years: 28.00  °  Pack years: 28.00  °  Types: Cigarettes  °  Quit date: 09/05/2001  °  Years since quitting: 17.1  °• Smokeless tobacco: Never Used  °Substance and Sexual Activity  °• Alcohol use: No  °  Alcohol/week: 0.0 standard drinks  °• Drug use: No  °• Sexual activity: Yes  °Lifestyle  °• Physical activity  °  Days per week: Not on file  °  Minutes per session: Not on file  °• Stress: Not on file  °Relationships  °• Social connections  °  Talks on phone: Not on file  °  Gets together: Not on file  °  Attends religious service: Not on file  °  Active member of club or organization: Not on file  °  Attends meetings of clubs or organizations: Not on file  °  Relationship status: Not on file  °• Intimate partner violence  °  Fear of current or ex partner: Not on file  °  Emotionally abused: Not on file  °  Physically abused: Not on file  °   Forced sexual activity: Not on file  °Other Topics Concern  °• Not on file  °Social History Narrative  ° Regular exercise- yes  ° °Family History  °Problem Relation Age of Onset  °• Diabetes Father   °• Heart disease Father   °• Deep vein thrombosis Father   °• Hyperlipidemia Father   °• Diabetes Sister   °• Heart disease Sister   °• Deep vein thrombosis Sister   °• Hyperlipidemia Sister   °• Diabetes Brother   °• Heart disease Brother   °• Cancer Brother   °• Hyperlipidemia Brother   °• Colon cancer Maternal Aunt   °• Diabetes Brother   °• Diabetes Brother   °• Kidney disease Mother   °• Hyperlipidemia Mother   °• Other Mother   °     AAA   and    Amputation  °• Arthritis Other   °• Cancer Other   °     colon  °• Hypertension Other   °• Stroke Other   °• Esophageal cancer Neg Hx   °• Stomach cancer Neg Hx   °• Inflammatory bowel disease Neg Hx   °• Liver disease Neg Hx   °• Pancreatic cancer Neg Hx   ° °I have reviewed her medical, social, and family history in detail and updated the electronic medical record as necessary.  ° ° °PHYSICAL EXAMINATION  °Telehealth visit ° ° °REVIEW OF DATA  °I reviewed the following data at the time of this encounter: ° °GI Procedures and Studies  °- Texture changed, decreased vascular pattern mucosa in the esophagus. Biopsied to rule °out esophagitis. °- Esophageal mucosal changes consistent with short-segment Barrett's esophagus. °Previous nodular Barrett's was not found. Insufflation and careful white-light and NBI did °not show evidence of nodularity. EMR not performed. Biopsied for dysplasia evaluation. °- Small hiatal   hernia - secondary to Lap-band. °- Erythematous mucosa in the antrum. Biopsied for HP. °- No gross lesions in the duodenal bulb, in the first portion of the duodenum and in the °second portion of the duodenum. °Diagnosis °1. Stomach, biopsy °- GASTRIC ANTRAL MUCOSA WITH MILD REACTIVE GASTROPATHY. °- GASTRIC OXYNTIC MUCOSA WITH MILD CHRONIC GASTRITIS. °-  WARTHIN-STARRY STAIN IS NEGATIVE FOR HELICOBACTER PYLORI. °2. Esophagus, biopsy °- GASTROESOPHAGEAL JUNCTION MUCOSA WITH REACTIVE/REGENERATIVE CHANGES. °- NEGATIVE FOR INTESTINAL METAPLASIA (GOBLET CELL METAPLASIA). °3. Esophagus, biopsy °- INTESTINAL METAPLASIA (GOBLET CELL METAPLASIA) CONSISTENT WITH BARRETT'S ESOPHAGUS, INDEFINITE °FOR DYSPLASIA. °4. Esophagus, biopsy °- GASTROESOPHAGEAL JUNCTION MUCOSA WITH REACTIVE/REGENERATIVE CHANGES. °- NEGATIVE FOR INTESTINAL METAPLASIA (GOBLET CELL METAPLASIA). °5. Esophagus, biopsy, Mid °- SQUAMOUS ESOPHAGEAL EPITHELIUM WITH NO SIGNIFICANT PATHOLOGIC FINDINGS. °- NEGATIVE FOR INCREASED INTRAEPITHELIAL EOSINOPHILS. ° °Laboratory Studies  °Reviewed in epic ° °Imaging Studies  °No relevant studies to review ° ° °ASSESSMENT  °Ms. Empson is a 62 y.o. female with a pmh significant for Barrett's esophagus (high-grade dysplasia and indeterminate dysplasia), GERD with esophagitis, hypertension, hyperlipidemia, peripheral arterial disease, paroxysmal atrial fibrillation (on anticoagulation), diabetes, B12 deficiency.  The patient is seen today for evaluation and management of: ° °1. Barrett's esophagus with high grade dysplasia   °2. Current use of long term anticoagulation   °3. History of esophagitis   ° °The patient is hemodynamically stable and from a clinical perspective is doing well.  She remains on high-dose PPI therapy.  With recent endoscopy showing no evidence of significant nodularity I think that she is an ideal candidate for us to begin RFA ablation in attempt of decreasing her risk of progression to esophageal cancer in the future.  I had an extensive discussion with the patient regarding the overall management of Barrett's esophagus (BE). I reviewed the epidemiology, pathophysiology and clinical expectations of Barrett's and dysplasia.  We had a detailed discussion of BE and how it is a condition in which intestinal metaplasia (IM) replaces the normal squamous mucosa  of the esophagus (typically in the lower part and at the GE junction). Replacement is believed to be secondary to acid reflux injury that occurs in the 2nd, 3rd and 4th decades of life. The condition of BE is noticed during endoscopy and confirmed by histopathology. We discussed that the length of the intestinal metaplasia is thought to remain static, however, the histopathology of the BE to cancer sequence is believed to progress along a continuum: non-dysplastic to low grade dysplasia (LGD) to high grade dysplasia (HGD) to intramucosal or early mucosal carcinoma (EMC) to advanced invasive carcinoma (AIC).  It is important to recognize that HGD and EMC may be indistinguishable histologically and the two often coexist.  We discussed the options for management which include endoluminal therapies including endoscopic mucosal resection and radiofrequency ablation.  We discussed the risks and benefits of each.  Treatment is always very individualized.  Age, co-morbidities, aversion to cancer risk, local surgical and endoscopic expertise in the area, are all considered when making a plan of care.  I informed the patient that endoscopic therapy typically requires an average of two to three treatment sessions though frequently more sessions are required for complete eradication of all intestinal metaplasia.  These sessions will be schedule every 2-3 months until all areas are effectively treated.  The goal of endoscopic therapy is complete eradication of dysplasia and intestinal metaplasia.  It is known that approximately 1 out of every 10 patients may not respond completely to endoscopic ablation therapy and all Barrett's tissue   tissue may not be able to be fully eradicated.  I reinforced that the patient must accept life-long endoscopic surveillance and acid suppression therapy as recurrence rates of BE may be as high as 30%.  Surveillance of previously treated BE involves repeating the endoscopy every 3 months for the first  year following complete eradication followed by endoscopy every 6 months for the second year, and then routinely thereafter.  Although every effort will be made to treat all of the Barrett's esophagus endoscopically, I reinforced that endoscopic eradication therapy does not prevent surgery down the line.  Furthermore, endoscopic therapy does not completely eliminate the risk of developing esophageal cancer. I also discussed the potential complications of endoluminal therapy which includes, but is not limited to: perforation, bleeding, and stricture formation.  I also reinforced that it is common to experience pain following radiofrequency ablation and this would be managed appropriately with pain medications post-procedure.  All patient questions were answered, to the best of my ability, and the patient agrees to the aforementioned plan of action with follow-up as indicated.   PLAN  Continue PPI therapy twice daily Move forward with EGD with RFA as previously scheduled Will move forward with prescribing Carafate as well as liquid analgesic now Further details about post ablation instructions to be given after procedure   No orders of the defined types were placed in this encounter.   New Prescriptions   HYDROCODONE-ACETAMINOPHEN (HYCET) 7.5-325 MG/15 ML SOLUTION    Take 10 mLs by mouth 4 (four) times daily as needed for moderate pain.   SUCRALFATE (CARAFATE) 1 GM/10ML SUSPENSION    Take 10 mLs (1 g total) by mouth 4 (four) times daily.   Modified Medications   No medications on file    Planned Follow Up No follow-ups on file.   Justice Britain, MD [facility] Office # [phone]

## 2018-11-05 NOTE — Anesthesia Preprocedure Evaluation (Addendum)
Anesthesia Evaluation  Patient identified by MRN, date of birth, ID band Patient awake    Reviewed: Allergy & Precautions, NPO status , Patient's Chart, lab work & pertinent test results  History of Anesthesia Complications (+) PONV and DIFFICULT AIRWAY  Airway Mallampati: II  TM Distance: >3 FB Neck ROM: Full    Dental no notable dental hx. (+) Teeth Intact   Pulmonary former smoker,    Pulmonary exam normal breath sounds clear to auscultation       Cardiovascular hypertension, + Peripheral Vascular Disease  Normal cardiovascular exam(-) dysrhythmias  Rhythm:Regular Rate:Normal     Neuro/Psych Anxiety    GI/Hepatic GERD  ,  Endo/Other  diabetesHypothyroidism   Renal/GU      Musculoskeletal   Abdominal   Peds  Hematology  (+) Blood dyscrasia, anemia ,   Anesthesia Other Findings   Reproductive/Obstetrics                           Lab Results  Component Value Date   CREATININE 0.71 10/08/2018   BUN 14 10/08/2018   NA 139 10/08/2018   K 3.4 (L) 10/08/2018   CL 101 10/08/2018   CO2 29 10/08/2018   Lab Results  Component Value Date   WBC 5.4 10/08/2018   HGB 13.2 10/08/2018   HCT 39.7 10/08/2018   MCV 89.5 10/08/2018   PLT 188.0 10/08/2018     Anesthesia Physical Anesthesia Plan  ASA: III  Anesthesia Plan: General   Post-op Pain Management:    Induction: Intravenous  PONV Risk Score and Plan: 3 and Treatment may vary due to age or medical condition, Dexamethasone and Ondansetron  Airway Management Planned: Oral ETT  Additional Equipment:   Intra-op Plan:   Post-operative Plan: Extubation in OR  Informed Consent: I have reviewed the patients History and Physical, chart, labs and discussed the procedure including the risks, benefits and alternatives for the proposed anesthesia with the patient or authorized representative who has indicated his/her understanding and  acceptance.     Dental advisory given  Plan Discussed with: CRNA  Anesthesia Plan Comments:        Anesthesia Quick Evaluationlastrenal

## 2018-11-05 NOTE — Progress Notes (Signed)
[facility] VISIT   Primary Care Provider [name], MD [address] [place] [address] [phone]  Patient Profile: Theresa Harrington is a 63 y.o. female with a pmh significant for Barrett's esophagus (high-grade dysplasia and indeterminate dysplasia), GERD with esophagitis, hypertension, hyperlipidemia, peripheral arterial disease, paroxysmal atrial fibrillation (on anticoagulation), diabetes, B12 deficiency.  The patient presents to the [facility] for an evaluation and management of problem(s) noted below:  Problem List 1. Barrett's esophagus with high grade dysplasia   2. Current use of long term anticoagulation   3. History of esophagitis     I connected with  [name] on 11/05/18. I verified that I was speaking with the correct person using two identifiers. Due to the COVID-19 Pandemic, this service was provided via telemedicine using Audio-Only. Interactive audio and video telecommunications were attempted between this provider and patient, however failed, due to patient not having access to video capability and thus to provide timely and excellent care, we continued and completed visit with audio only. The patient was located at home. The provider was located in the office. The patient did consent to this visit and is aware of charges through their insurance as well as the limitations of evaluation and management by telemedicine. Other persons participating in this telemedicine service were none. Time spent on visit was 25 minutes in discussion and in coordination of care.   History of Present Illness This is a patient that was initially referred to me by Dr. Loletha Carrow for consideration of EMR for high-grade dysplasia found in Barrett's esophagus on 2 separate endoscopies.  I recently performed an EGD and found no further evidence of nodularity in the esophagus and repeat biopsies were obtained showing evidence of  indeterminate dysplasia.  Esophagitis had healed as well.  The patient has done well and is following up today so that we can discuss consideration of radiofrequency ablation for Barrett's esophagus.  Overall, the patient is doing well.  She denies any significant dysphagia.  No chest pain or chest discomfort.  No abdominal pains currently.  GI Review of Systems Positive as above Negative for odynophagia, nausea, vomiting, bloating, change in bowel habits, melena, hematochezia  Review of Systems General: Denies fevers/chills/weight loss HEENT: Denies oral lesions Cardiovascular: Denies chest pain Pulmonary: Denies shortness of breath Gastroenterological: See HPI Genitourinary: Denies darkened urine Hematological: Denies easy bruising/bleeding Dermatological: Denies jaundice Psychological: Mood is stable   Medications Current Outpatient Medications  Medication Sig Dispense Refill   Blood Glucose Monitoring Suppl (ONETOUCH VERIO IQ SYSTEM) w/Device KIT USE TO CHECK SUGAR DAILY 1 kit 0   CARTIA XT 120 MG 24 hr capsule Take 1 capsule by mouth once daily (Patient taking differently: Take 120 mg by mouth daily. ) 90 capsule 0   ezetimibe (ZETIA) 10 MG tablet Take 10 mg by mouth at bedtime.      glucose blood test strip Use to check blood sugar twice daily. DX: E11.8 200 each 3   HYDROcodone-acetaminophen (HYCET) 7.5-325 mg/15 ml solution Take 10 mLs by mouth 4 (four) times daily as needed for moderate pain. 120 mL 0   insulin aspart (NOVOLOG) 100 UNIT/ML injection Inject 14-20 Units into the skin 3 (three) times daily before meals.     insulin detemir (LEVEMIR) 100 UNIT/ML injection Inject 80 Units into the skin at bedtime. Use until gone then switch to Basaglar     Insulin Glargine (BASAGLAR KWIKPEN) 100 UNIT/ML SOPN Inject 0.8 mLs (80 Units  total) into the skin daily. 9 mL 1   Insulin Pen Needle (PEN NEEDLES) 32G X 4 MM MISC Inject 1 pen as directed 4 (four) times daily. Use to  inject levemir or novolog as directed. 400 each 3   latanoprost (XALATAN) 0.005 % ophthalmic solution Place 1 drop into both eyes at bedtime.      losartan-hydrochlorothiazide (HYZAAR) 100-12.5 MG tablet Take 1 tablet by mouth once daily (Patient taking differently: Take 1 tablet by mouth daily. ) 90 tablet 1   magnesium oxide (MAG-OX) 400 (241.3 Mg) MG tablet TAKE 1 TABLET BY MOUTH ONCE DAILY (Patient taking differently: Take 400 mg by mouth daily. ) 90 tablet 1   metFORMIN (GLUCOPHAGE) 1000 MG tablet TAKE 1 TABLET BY MOUTH TWICE DAILY WITH MEALS (Patient taking differently: Take 1,000 mg by mouth 2 (two) times a day. ) 180 tablet 0   omeprazole (PRILOSEC) 40 MG capsule Take 1 capsule (40 mg total) by mouth 2 (two) times daily before a meal. 60 capsule 3   potassium chloride SA (K-DUR) 20 MEQ tablet Take 1 tablet (20 mEq total) by mouth 2 (two) times daily. 180 tablet 1   pravastatin (PRAVACHOL) 40 MG tablet Take 1 tablet (40 mg total) by mouth daily. (Patient taking differently: Take 40 mg by mouth at bedtime. ) 90 tablet 3   rivaroxaban (XARELTO) 20 MG TABS tablet Take 1 tablet (20 mg total) by mouth daily with supper. (Patient taking differently: Take 20 mg by mouth daily. ) 90 tablet 3   sucralfate (CARAFATE) 1 GM/10ML suspension Take 10 mLs (1 g total) by mouth 4 (four) times daily. 420 mL 1   Thiamine HCl (VITAMIN B1) 100 MG TABS Take 1 tablet by mouth once daily (Patient taking differently: Take 100 mg by mouth at bedtime. ) 90 tablet 0   TRULICITY 8.29 HB/7.1IR SOPN PLEASE START INJECTING 0.75MG UNDER THE SKIN WEEKLY (Patient taking differently: Inject 0.75 mg as directed every Monday. ) 4 mL 0   No current facility-administered medications for this visit.     Allergies Allergies  Allergen Reactions   Amlodipine Swelling   Atorvastatin Other (See Comments)    Muscle aches    Ampicillin Nausea And Vomiting and Other (See Comments)   Codeine Nausea And Vomiting    Ask  patient   Lisinopril Cough        Penicillins Nausea And Vomiting    Did it involve swelling of the face/tongue/throat, SOB, or low BP? No Did it involve sudden or severe rash/hives, skin peeling, or any reaction on the inside of your mouth or nose? No Did you need to seek medical attention at a hospital or doctor's office? No When did it last happen?20+ years If all above answers are NO, may proceed with cephalosporin use.    Januvia [Sitagliptin] Nausea And Vomiting    Histories Past Medical History:  Diagnosis Date   Allergy    Anxiety    Pt. denies   Asymptomatic cholelithiasis    Atherosclerosis of aorta (HCC)    Cataract    Clotting disorder ([place])    Gallstones 06/2013   GERD (gastroesophageal reflux disease)    History of kidney stones    lithotrispy   Hyperlipidemia    Hypertension    Hypothyroidism    no meds   Iron deficiency anemia    PAD (peripheral artery disease) (HCC)    PAF (paroxysmal atrial fibrillation) ([place])    Peripheral arterial occlusive disease ([place])  lower extremities   Pneumonia    PONV (postoperative nausea and vomiting)    Right ureteral stone    Type 2 diabetes mellitus (HCC)    Type   Vitamin B 12 deficiency    Wears glasses    Past Surgical History:  Procedure Laterality Date   AMPUTATION Left 07/16/2014   Procedure: LEFT SECOND TOE AMPUTATION;  Surgeon: Serafina Mitchell, MD;  Location: [facility];  Service: Vascular;  Laterality: Left;  With Nerve block   AUGMENTATION MAMMAPLASTY     BELPHAROPTOSIS REPAIR     eyelid lift   BIOPSY  10/14/2018   Procedure: BIOPSY;  Surgeon: Rush Landmark Telford Nab., MD;  Location: [facility];  Service: Gastroenterology;;   COLONOSCOPY     COMBINED AUGMENTATION MAMMAPLASTY AND ABDOMINOPLASTY  2009   W/  BILATERAL  THIGH LIFT   CYSTO/  RIGHT URETERAL STENT PLACEMENT  12-30-2010   CYSTOSCOPY WITH RETROGRADE PYELOGRAM, URETEROSCOPY AND STENT PLACEMENT Right 10/07/2013    Procedure: CYSTOSCOPY WITH RETROGRADE PYELOGRAM, right URETEROSCOPY AND STENT PLACEMENT, stone extraction;  Surgeon: Arvil Persons, MD;  [facility];  Service: Urology;  Laterality: Right;   CYSTOSCOPY WITH STENT PLACEMENT Right 06/04/2013   Procedure: CYSTOSCOPY WITH STENT PLACEMENT;  Surgeon: Franchot Gallo, MD;  Location: [facility];  Service: Urology;  Laterality: Right;   DILATATION & CURETTAGE/HYSTEROSCOPY WITH MYOSURE N/A 04/27/2015   Procedure: DILATATION & CURETTAGE/HYSTEROSCOPY WITH MYOSURE;  Surgeon: Terrance Mass, MD;  Location: [facility];  Service: Gynecology;  Laterality: N/A;   ENDARTERECTOMY FEMORAL Left 06/08/2014   Procedure: Left Leg Common Femoral and External Iliac  Endartarectomy with patch Angioplasty;  Surgeon: Rosetta Posner, MD;  Location: [facility];  Service: Vascular;  Laterality: Left;   ESOPHAGOGASTRODUODENOSCOPY N/A 10/14/2018   Procedure: ESOPHAGOGASTRODUODENOSCOPY (EGD);  Surgeon: Irving Copas., MD;  Location: [place];  Service: Gastroenterology;  Laterality: N/A;   EXTRACORPOREAL SHOCK WAVE LITHOTRIPSY Right 08-04-2013//   06-23-2013//   01-16-2011   EYE SURGERY Bilateral    cataract   FEMORAL-POPLITEAL BYPASS GRAFT Left 06/08/2014   Procedure: Left Leg Femoral -Popliteal Bypass Graft;  Surgeon: Rosetta Posner, MD;  Location: [facility];  Service: Vascular;  Laterality: Left;   FEMORAL-POPLITEAL BYPASS GRAFT Left 06/09/2014   Procedure: Left Femoral and Popliteal Exposure; Left Femoral to Anterior Tibial Bypass Graft using Propaten 9m by 80cm Goretex Graft; Left Tibial Endarterectomy; Left Femoraland Popliteal Thrombectomy ;  Surgeon: VSerafina Mitchell MD;  [place]  Service: Vascular;  Laterality: Left;   HOLMIUM LASER APPLICATION Right 68/08/275  Procedure: HOLMIUM LASER APPLICATION;  Surgeon: MArvil Persons MD;  [facility]  Service: Urology;  Laterality: Right;   KIDNEY STONE SURGERY  April 2015   1-2  stones   LAPAROSCOPIC GASTRIC BANDING  05-29-2005   LITHOTRIPSY  2-3 times   LOWER EXTREMITY ANGIOGRAM N/A 06/04/2014   Procedure: LOWER EXTREMITY ANGIOGRAM;  Surgeon: VSerafina Mitchell MD;  [facility];  Service: Cardiovascular;  Laterality: N/A;   ORIF FIFTH METACARPAL FX  RIGHT HAND  04-21-2002   REVISION AND RE-SITING LAP-BAND PORT  04-08-2010   W/  UPPER EGD   RIGHT KNEE PATELLECTOMY W/ REPAIR OF EXTENSOR MECHANISM  04-14-2002   Toenail removed Left Jan. 21, 2016   2nd toenail-  Dr. PBarkley Bruns  Social History   Socioeconomic History   Marital status: Single    Spouse name: Not on file   Number of children: Not  on file   Years of education: Not on file   Highest education level: Not on file  Occupational History   Occupation: Quarry manager: [employer] resource strain: Not on file   Food insecurity    Worry: Not on file    Inability: Not on file   Transportation needs    Medical: Not on file    Non-medical: Not on file  Tobacco Use   Smoking status: Former Smoker    Packs/day: 1.00    Years: 28.00    Pack years: 28.00    Types: Cigarettes    Quit date: 09/05/2001    Years since quitting: 17.1   Smokeless tobacco: Never Used  Substance and Sexual Activity   Alcohol use: No    Alcohol/week: 0.0 standard drinks   Drug use: No   Sexual activity: Yes  Lifestyle   Physical activity    Days per week: Not on file    Minutes per session: Not on file   Stress: Not on file  Relationships   Social connections    Talks on phone: Not on file    Gets together: Not on file    Attends religious service: Not on file    Active member of club or organization: Not on file    Attends meetings of clubs or organizations: Not on file    Relationship status: Not on file   Intimate partner violence    Fear of current or ex partner: Not on file    Emotionally abused: Not on file    Physically abused: Not on file     Forced sexual activity: Not on file  Other Topics Concern   Not on file  Social History Narrative   Regular exercise- yes   Family History  Problem Relation Age of Onset   Diabetes Father    Heart disease Father    Deep vein thrombosis Father    Hyperlipidemia Father    Diabetes Sister    Heart disease Sister    Deep vein thrombosis Sister    Hyperlipidemia Sister    Diabetes Brother    Heart disease Brother    Cancer Brother    Hyperlipidemia Brother    Colon cancer Maternal Aunt    Diabetes Brother    Diabetes Brother    Kidney disease Mother    Hyperlipidemia Mother    Other Mother        AAA   and    Amputation   Arthritis Other    Cancer Other        colon   Hypertension Other    Stroke Other    Esophageal cancer Neg Hx    Stomach cancer Neg Hx    Inflammatory bowel disease Neg Hx    Liver disease Neg Hx    Pancreatic cancer Neg Hx    I have reviewed her medical, social, and family history in detail and updated the electronic medical record as necessary.    PHYSICAL EXAMINATION  Telehealth visit   REVIEW OF DATA  I reviewed the following data at the time of this encounter:  GI Procedures and Studies  - Texture changed, decreased vascular pattern mucosa in the esophagus. Biopsied to rule out esophagitis. - Esophageal mucosal changes consistent with short-segment Barrett's esophagus. Previous nodular Barrett's was not found. Insufflation and careful white-light and NBI did not show evidence of nodularity. EMR not performed. Biopsied for dysplasia evaluation. - Small  hiatal hernia - secondary to Lap-band. - Erythematous mucosa in the antrum. Biopsied for HP. - No gross lesions in the duodenal bulb, in the first portion of the duodenum and in the second portion of the duodenum. Diagnosis 1. Stomach, biopsy - GASTRIC ANTRAL MUCOSA WITH MILD REACTIVE GASTROPATHY. - GASTRIC OXYNTIC MUCOSA WITH MILD CHRONIC GASTRITIS. -  WARTHIN-STARRY STAIN IS NEGATIVE FOR HELICOBACTER PYLORI. 2. Esophagus, biopsy - GASTROESOPHAGEAL JUNCTION MUCOSA WITH REACTIVE/REGENERATIVE CHANGES. - NEGATIVE FOR INTESTINAL METAPLASIA (GOBLET CELL METAPLASIA). 3. Esophagus, biopsy - INTESTINAL METAPLASIA (GOBLET CELL METAPLASIA) CONSISTENT WITH BARRETT'S ESOPHAGUS, INDEFINITE FOR DYSPLASIA. 4. Esophagus, biopsy - GASTROESOPHAGEAL JUNCTION MUCOSA WITH REACTIVE/REGENERATIVE CHANGES. - NEGATIVE FOR INTESTINAL METAPLASIA (GOBLET CELL METAPLASIA). 5. Esophagus, biopsy, Mid - SQUAMOUS ESOPHAGEAL EPITHELIUM WITH NO SIGNIFICANT PATHOLOGIC FINDINGS. - NEGATIVE FOR INCREASED INTRAEPITHELIAL EOSINOPHILS.  Laboratory Studies  Reviewed in epic  Imaging Studies  No relevant studies to review   ASSESSMENT  Ms. Grulke is a 63 y.o. female with a pmh significant for Barrett's esophagus (high-grade dysplasia and indeterminate dysplasia), GERD with esophagitis, hypertension, hyperlipidemia, peripheral arterial disease, paroxysmal atrial fibrillation (on anticoagulation), diabetes, B12 deficiency.  The patient is seen today for evaluation and management of:  1. Barrett's esophagus with high grade dysplasia   2. Current use of long term anticoagulation   3. History of esophagitis    The patient is hemodynamically stable and from a clinical perspective is doing well.  She remains on high-dose PPI therapy.  With recent endoscopy showing no evidence of significant nodularity I think that she is an ideal candidate for [place] to begin RFA ablation in attempt of decreasing her risk of progression to esophageal cancer in the future.  I had an extensive discussion with the patient regarding the overall management of Barrett's esophagus (BE). I reviewed the epidemiology, pathophysiology and clinical expectations of Barrett's and dysplasia.  We had a detailed discussion of BE and how it is a condition in which intestinal metaplasia (IM) replaces the normal squamous mucosa  of the esophagus (typically in the lower part and at the GE junction). Replacement is believed to be secondary to acid reflux injury that occurs in the 2nd, 3rd and 4th decades of life. The condition of BE is noticed during endoscopy and confirmed by histopathology. We discussed that the length of the intestinal metaplasia is thought to remain static, however, the histopathology of the BE to cancer sequence is believed to progress along a continuum: non-dysplastic to low grade dysplasia (LGD) to high grade dysplasia (HGD) to intramucosal or early mucosal carcinoma [facility]) to advanced invasive carcinoma (AIC).  It is important to recognize that HGD and EMC may be indistinguishable histologically and the two often coexist.  We discussed the options for management which include endoluminal therapies including endoscopic mucosal resection and radiofrequency ablation.  We discussed the risks and benefits of each.  Treatment is always very individualized.  Age, co-morbidities, aversion to cancer risk, local surgical and endoscopic expertise in the area, are all considered when making a plan of care.  I informed the patient that endoscopic therapy typically requires an average of two to three treatment sessions though frequently more sessions are required for complete eradication of all intestinal metaplasia.  These sessions will be schedule every 2-3 months until all areas are effectively treated.  The goal of endoscopic therapy is complete eradication of dysplasia and intestinal metaplasia.  It is known that approximately 1 out of every 10 patients may not respond completely to endoscopic ablation therapy and all Barrett's  tissue may not be able to be fully eradicated.  I reinforced that the patient must accept life-long endoscopic surveillance and acid suppression therapy as recurrence rates of BE may be as high as 30%.  Surveillance of previously treated BE involves repeating the endoscopy every 3 months for the first  year following complete eradication followed by endoscopy every 6 months for the second year, and then routinely thereafter.  Although every effort will be made to treat all of the Barrett's esophagus endoscopically, I reinforced that endoscopic eradication therapy does not prevent surgery down the line.  Furthermore, endoscopic therapy does not completely eliminate the risk of developing esophageal cancer. I also discussed the potential complications of endoluminal therapy which includes, but is not limited to: perforation, bleeding, and stricture formation.  I also reinforced that it is common to experience pain following radiofrequency ablation and this would be managed appropriately with pain medications post-procedure.  All patient questions were answered, to the best of my ability, and the patient agrees to the aforementioned plan of action with follow-up as indicated.   PLAN  Continue PPI therapy twice daily Move forward with EGD with RFA as previously scheduled Will move forward with prescribing Carafate as well as liquid analgesic now Further details about post ablation instructions to be given after procedure   No orders of the defined types were placed in this encounter.   New Prescriptions   HYDROCODONE-ACETAMINOPHEN (HYCET) 7.5-325 MG/15 ML SOLUTION    Take 10 mLs by mouth 4 (four) times daily as needed for moderate pain.   SUCRALFATE (CARAFATE) 1 GM/10ML SUSPENSION    Take 10 mLs (1 g total) by mouth 4 (four) times daily.   Modified Medications   No medications on file    Planned Follow Up No follow-ups on file.   Justice Britain, MD [facility] Advanced Endoscopy Office # [phone]

## 2018-11-05 NOTE — Patient Instructions (Signed)
You have been scheduled for an endoscopy. Please follow written instructions given to you at your visit today. If you use inhalers (even only as needed), please bring them with you on the day of your procedure.  We have sent your prescriptions to your pharmacy  Thank you for choosing me and [facility].  Chester Holstein, MD

## 2018-11-06 ENCOUNTER — Other Ambulatory Visit: Payer: Self-pay

## 2018-11-06 ENCOUNTER — Encounter (HOSPITAL_COMMUNITY): Payer: Self-pay | Admitting: Anesthesiology

## 2018-11-06 ENCOUNTER — Ambulatory Visit (HOSPITAL_COMMUNITY)
Admission: RE | Admit: 2018-11-06 | Discharge: 2018-11-06 | Disposition: A | Discharge status: Home / Self Care | Payer: PPO | Attending: Gastroenterology | Admitting: Gastroenterology

## 2018-11-06 ENCOUNTER — Ambulatory Visit (HOSPITAL_COMMUNITY): Payer: PPO | Admitting: Anesthesiology

## 2018-11-06 ENCOUNTER — Encounter (HOSPITAL_COMMUNITY)
Admission: RE | Disposition: A | Discharge status: Home / Self Care | Payer: Self-pay | Source: Home / Self Care | Attending: Gastroenterology

## 2018-11-06 ENCOUNTER — Encounter (HOSPITAL_COMMUNITY): Payer: Self-pay | Admitting: *Deleted

## 2018-11-06 ENCOUNTER — Encounter: Payer: Self-pay | Admitting: Gastroenterology

## 2018-11-06 DIAGNOSIS — K210 Gastro-esophageal reflux disease with esophagitis: Secondary | ICD-10-CM | POA: Insufficient documentation

## 2018-11-06 DIAGNOSIS — Z7901 Long term (current) use of anticoagulants: Secondary | ICD-10-CM | POA: Diagnosis not present

## 2018-11-06 DIAGNOSIS — R1310 Dysphagia, unspecified: Secondary | ICD-10-CM | POA: Diagnosis not present

## 2018-11-06 DIAGNOSIS — Z79899 Other long term (current) drug therapy: Secondary | ICD-10-CM | POA: Insufficient documentation

## 2018-11-06 DIAGNOSIS — Z87891 Personal history of nicotine dependence: Secondary | ICD-10-CM | POA: Diagnosis not present

## 2018-11-06 DIAGNOSIS — Z888 Allergy status to other drugs, medicaments and biological substances status: Secondary | ICD-10-CM | POA: Diagnosis not present

## 2018-11-06 DIAGNOSIS — K3189 Other diseases of stomach and duodenum: Secondary | ICD-10-CM | POA: Diagnosis not present

## 2018-11-06 DIAGNOSIS — Z880 Allergy status to penicillin: Secondary | ICD-10-CM | POA: Insufficient documentation

## 2018-11-06 DIAGNOSIS — T18128A Food in esophagus causing other injury, initial encounter: Secondary | ICD-10-CM | POA: Diagnosis not present

## 2018-11-06 DIAGNOSIS — K22711 Barrett's esophagus with high grade dysplasia: Secondary | ICD-10-CM | POA: Insufficient documentation

## 2018-11-06 DIAGNOSIS — K449 Diaphragmatic hernia without obstruction or gangrene: Secondary | ICD-10-CM | POA: Insufficient documentation

## 2018-11-06 DIAGNOSIS — Z794 Long term (current) use of insulin: Secondary | ICD-10-CM | POA: Insufficient documentation

## 2018-11-06 DIAGNOSIS — K209 Esophagitis, unspecified: Secondary | ICD-10-CM | POA: Diagnosis not present

## 2018-11-06 DIAGNOSIS — I480 Paroxysmal atrial fibrillation: Secondary | ICD-10-CM | POA: Insufficient documentation

## 2018-11-06 DIAGNOSIS — I10 Essential (primary) hypertension: Secondary | ICD-10-CM | POA: Insufficient documentation

## 2018-11-06 DIAGNOSIS — X58XXXA Exposure to other specified factors, initial encounter: Secondary | ICD-10-CM | POA: Diagnosis not present

## 2018-11-06 DIAGNOSIS — E1151 Type 2 diabetes mellitus with diabetic peripheral angiopathy without gangrene: Secondary | ICD-10-CM | POA: Insufficient documentation

## 2018-11-06 DIAGNOSIS — E785 Hyperlipidemia, unspecified: Secondary | ICD-10-CM | POA: Diagnosis not present

## 2018-11-06 DIAGNOSIS — Z885 Allergy status to narcotic agent status: Secondary | ICD-10-CM | POA: Insufficient documentation

## 2018-11-06 DIAGNOSIS — Z8719 Personal history of other diseases of the digestive system: Secondary | ICD-10-CM | POA: Insufficient documentation

## 2018-11-06 HISTORY — PX: GI RADIOFREQUENCY ABLATION: SHX6807

## 2018-11-06 HISTORY — DX: Iron deficiency anemia, unspecified: D50.9

## 2018-11-06 HISTORY — DX: Deficiency of other specified B group vitamins: E53.8

## 2018-11-06 HISTORY — DX: Paroxysmal atrial fibrillation: I48.0

## 2018-11-06 HISTORY — PX: ESOPHAGOGASTRODUODENOSCOPY (EGD) WITH PROPOFOL: SHX5813

## 2018-11-06 LAB — GLUCOSE, CAPILLARY: Glucose-Capillary: 123 mg/dL — ABNORMAL HIGH (ref 70–99)

## 2018-11-06 SURGERY — ESOPHAGOGASTRODUODENOSCOPY (EGD) WITH PROPOFOL
Anesthesia: Monitor Anesthesia Care

## 2018-11-06 MED ORDER — FENTANYL CITRATE (PF) 100 MCG/2ML IJ SOLN
INTRAMUSCULAR | Status: AC
  Filled 2018-11-06: qty 2

## 2018-11-06 MED ORDER — OMEPRAZOLE 40 MG PO CPDR: 40.0000 mg | DELAYED_RELEASE_CAPSULE | Freq: Two times a day (BID) | ORAL | 6 refills | Status: DC

## 2018-11-06 MED ORDER — DEXAMETHASONE SODIUM PHOSPHATE 10 MG/ML IJ SOLN
INTRAMUSCULAR | Status: DC | PRN
  Administered 2018-11-06: 6 mg via INTRAVENOUS

## 2018-11-06 MED ORDER — STERILE WATER FOR INJECTION IJ SOLN
RESPIRATORY_TRACT | Status: DC | PRN
  Administered 2018-11-06: 60 mL via OROMUCOSAL

## 2018-11-06 MED ORDER — LIDOCAINE 2% (20 MG/ML) 5 ML SYRINGE
INTRAMUSCULAR | Status: DC | PRN
  Administered 2018-11-06: 100 mg via INTRAVENOUS

## 2018-11-06 MED ORDER — FENTANYL CITRATE (PF) 100 MCG/2ML IJ SOLN
INTRAMUSCULAR | Status: DC | PRN
  Administered 2018-11-06: 75 ug via INTRAVENOUS
  Administered 2018-11-06: 25 ug via INTRAVENOUS

## 2018-11-06 MED ORDER — AMBULATORY NON FORMULARY MEDICATION: 1 refills | Status: DC

## 2018-11-06 MED ORDER — ACETYLCYSTEINE 20 % IN SOLN
RESPIRATORY_TRACT | Status: AC
  Filled 2018-11-06: qty 8

## 2018-11-06 MED ORDER — GLYCOPYRROLATE PF 0.2 MG/ML IJ SOSY
PREFILLED_SYRINGE | INTRAMUSCULAR | Status: DC | PRN
  Administered 2018-11-06: .2 mg via INTRAVENOUS

## 2018-11-06 MED ORDER — PROPOFOL 10 MG/ML IV BOLUS
INTRAVENOUS | Status: AC
  Filled 2018-11-06: qty 20

## 2018-11-06 MED ORDER — SUGAMMADEX SODIUM 200 MG/2ML IV SOLN
INTRAVENOUS | Status: DC | PRN
  Administered 2018-11-06: 150 mg via INTRAVENOUS
  Administered 2018-11-06: 200 mg via INTRAVENOUS

## 2018-11-06 MED ORDER — ROCURONIUM BROMIDE 10 MG/ML (PF) SYRINGE
PREFILLED_SYRINGE | INTRAVENOUS | Status: DC | PRN
  Administered 2018-11-06: 50 mg via INTRAVENOUS

## 2018-11-06 MED ORDER — LACTATED RINGERS IV SOLN
INTRAVENOUS | Status: DC
  Administered 2018-11-06: 09:00:00 via INTRAVENOUS

## 2018-11-06 MED ORDER — SODIUM CHLORIDE 0.9 % IV SOLN: INTRAVENOUS | Status: DC

## 2018-11-06 MED ORDER — ONDANSETRON HCL 4 MG/2ML IJ SOLN
INTRAMUSCULAR | Status: DC | PRN
  Administered 2018-11-06: 4 mg via INTRAVENOUS

## 2018-11-06 MED ORDER — PROPOFOL 10 MG/ML IV BOLUS
INTRAVENOUS | Status: DC | PRN
  Administered 2018-11-06: 150 mg via INTRAVENOUS

## 2018-11-06 SURGICAL SUPPLY — 15 items

## 2018-11-06 NOTE — Op Note (Signed)
[facility] Patient Name: Theresa Harrington Procedure Date: 11/06/2018 MRN: [id number] Attending MD: Justice Britain , MD Date of Birth: 1955-12-17 CSN: [id number] Age: 63 Admit Type: Outpatient Procedure:                Upper GI endoscopy Indications:              Dysphagia, For therapy of Barrett's esophagus, For                            endoscopic therapy of Barrett's esophagus with high                            grade dysplasia Providers:                Justice Britain, MD, Burtis Junes, RN, Elspeth Cho Tech., Technician, Arnoldo Hooker, CRNA Referring MD:             Estill Cotta. Loletha Carrow, MD Medicines:                General Anesthesia Complications:            No immediate complications. Estimated Blood Loss:     Estimated blood loss was minimal. Procedure:                Pre-Anesthesia Assessment:                           - Prior to the procedure, a History and Physical                            was performed, and patient medications and                            allergies were reviewed. The patient's tolerance of                            previous anesthesia was also reviewed. The risks                            and benefits of the procedure and the sedation                            options and risks were discussed with the patient.                            All questions were answered, and informed consent                            was obtained. Prior Anticoagulants: The patient has                            taken Xarelto (rivaroxaban), last dose was 3 days  prior to procedure. ASA Grade Assessment: III - A                            patient with severe systemic disease. After                            reviewing the risks and benefits, the patient was                            deemed in satisfactory condition to undergo the                            procedure.                           After obtaining  informed consent, the endoscope was                            passed under direct vision. Throughout the                            procedure, the patient's blood pressure, pulse, and                            oxygen saturations were monitored continuously. The                            GIF-H190 ([id number]) Olympus gastroscope was                            introduced through the mouth, and advanced to the                            second part of duodenum. The upper GI endoscopy was                            accomplished without difficulty. The patient                            tolerated the procedure. Scope In: Scope Out: Findings:      No gross lesions were noted in the proximal esophagus and in the mid       esophagus.      LA Grade B (one or more mucosal breaks greater than 5 mm, not extending       between the tops of two mucosal folds) esophagitis was found in the       distal esophagus just proximal to the Barrett's.      The esophagus and gastroesophageal junction were examined with white       light and narrow band imaging (NBI) from a forward view and retroflexed       position. There were esophageal mucosal changes secondary to established       short-segment Barrett's disease. These changes involved the mucosa at       the upper extent of the gastric folds (39 cm from the incisors)  extending to the Z-line (38 cm from the incisors). One tongue of       salmon-colored mucosa was present from 35 to 38 cm and scattered islands       of salmon-colored mucosa were present from 36 to 37 cm. The maximum       longitudinal extent of these esophageal mucosal changes was 3 cm in       length. Focal radiofrequency ablation of Barrett's esophagus was       performed. With the endoscope in place, the position and extent of the       Barrett's mucosa and the anatomic landmarks including proximal and       distal extent of Barrett's mucosa and top of gastric folds were noted.        Endoscopic visualization identified ablation sites including the entire       visible Barrett's segment and extending from 34 to 39 cm from the       incisors. The Barrett's mucosa was irrigated with N-acetylcysteine       (Mucomyst) 1% mixed with water. The endoscope was then removed from the       patient. The Barrx-90 radiofrequency ablation catheter was attached to       the tip of the endoscope. The endoscope with the attached radiofrequency       ablation catheter was then passed transorally under direct vision into       the esophagus and advanced to the areas of Barrett's mucosa. The areas       included islands and tongues of Barrett's mucosa. The radiofrequency       ablation catheter was placed in contact with the surface of the       Barrett's mucosa under direct visualization and energy was applied twice       at 12 J/cm2. Ablation was repeated in a likewise fashion to treat the       entire area of suspected Barrett's mucosa and treat circumferentially       around the squamocolumnar junction. The ablation zone was cleaned of       coagulative debris. The ablation catheter and endoscope were then       removed and the catheter was cleaned. The catheter and endoscope were       reinserted into the esophagus. A second round of ablation was then       performed. Energy was applied twice at 12 J/cm2 to retreat the areas of       Barrett's epithelium that had been treated with the first series of       ablation. The areas of the esophagus where Barrett's mucosa had been       ablated were examined. Areas of Barrett's esophagus were completely       ablated. The total number of energy applications for all mucosal sites       treated was 56. A circumferential attempt at ablation was performed to       optimize therapy.      A 3 cm hiatal hernia was found. The proximal extent of the gastric folds       (end of tubular esophagus) was 39 cm from the incisors. The hiatal       narrowing  was 42 cm from the incisors. The Z-line was 38 cm from the       incisors. Within the hiatal hernia there were foodstuffs (corn) that       initially precluded  ability to perform RFA. Removal of food was       accomplished with a Suction via Endoscope and pushing the foodstuffs       into the stomach.      Striped mildly erythematous mucosa without bleeding was found in the       gastric antrum.      No other gross lesions were noted in the entire examined stomach.      No gross lesions were noted in the duodenal bulb, in the first portion       of the duodenum and in the second portion of the duodenum. Impression:               - No gross lesions in esophagus.                           - LA Grade B esophagitis.                           - Esophageal mucosal changes secondary to                            established short-segment Barrett's disease.                            Treated with radiofrequency ablation.                           - 3 cm hiatal hernia. Foodstuffs found in hernia                            sac - removed before ablation performed.                           - Erythematous mucosa in the antrum.                           - No gross lesions in the stomach.                           - No gross lesions in the duodenal bulb, in the                            first portion of the duodenum and in the second                            portion of the duodenum. Moderate Sedation:      Not Applicable - Patient had care per Anesthesia. Recommendation:           - The patient will be observed post-procedure,                            until all discharge criteria are met.                           - Discharge patient to home.                           -  Patient has a contact number available for                            emergencies. The signs and symptoms of potential                            delayed complications were discussed with the                            patient.  Return to normal activities tomorrow.                            Written discharge instructions were provided to the                            patient.                           - Clear liquid diet for 24 hours. Then, full liquid                            diet for 72 hours. Then, soft diet for 1 week                            thereafter. Then advance your diet thereafter to                            regular.                           - Continue your Omeprazole 40 mg twice daily for                            the next 3 months (Rx with refills sent to                            pharmacy).                           - You were prescribed a lidocaine/antiacid mixture                            that you can take up to 4 times per day as needed -                            take no matter what for at least first 3-days.                           - You were prescribed liquid Tylenol with                            hydrocodone that you can use up to 4 times daily  for the next 72 hours if needed - I recommend using                            this even if you do not have pain for the first 2-3                            days.                           - You were prescribed Sucralfate Suspension and you                            will take this 4 times daily (make sure you are not                            taking any other medication 1 hour before or 1 hour                            after this medication is taken) to allow coating                            and healing of your esophagus - this needs to be                            taken for at least 32-month(you should have refills                            available but call if you run out).                           - May restart Xarelto in 72 hours in order to                            decrease risk of post-RFA bleeding.                           - Minimize use of NSAIDs for next month as able.                            - Return to GI clinic in 3 weeks.                           - Repeat upper endoscopy in 2-3 months for                            retreatment (will need to be on a liquid diet for                            24 hours prior to next procedure).                           - Patient's  intermittent dysphagia, likely a result                            of her hiatal hernia. In future may benefit from                            fundoplication + hiatal hernia repair which could                            be helpful for Barrett's progression in future as                            well - down the road can be considered and                            discussed with GI.                           - Observe patient's clinical course.                           - The findings and recommendations were discussed                            with the patient. Procedure Code(s):        --- Professional ---                           [phone], Esophagogastroduodenoscopy, flexible,                            transoral; with ablation of tumor(s), polyp(s), or                            other lesion(s) (includes pre- and post-dilation                            and guide wire passage, when performed)                           43247, Esophagogastroduodenoscopy, flexible,                            transoral; with removal of foreign body(s) Diagnosis Code(s):        --- Professional ---                           K20.9, Esophagitis, unspecified                           K44.9, Diaphragmatic hernia without obstruction or                            gangrene  K31.89, Other diseases of stomach and duodenum                           R13.10, Dysphagia, unspecified                           K22.711, Barrett's esophagus with high grade                            dysplasia CPT copyright 2019 [employer] Medical Association. All rights reserved. The codes documented in this report are preliminary and  upon coder review may  be revised to meet current compliance requirements. Justice Britain, MD 11/06/2018 10:43:41 AM Number of Addenda: 0

## 2018-11-06 NOTE — Anesthesia Postprocedure Evaluation (Signed)
Anesthesia Post Note  Patient: Theresa Harrington  Procedure(s) Performed: ESOPHAGOGASTRODUODENOSCOPY (EGD) WITH PROPOFOL (N/A ) GI RADIOFREQUENCY ABLATION (N/A )     Patient location during evaluation: Endoscopy Anesthesia Type: General Level of consciousness: awake and alert Pain management: pain level controlled Vital Signs Assessment: post-procedure vital signs reviewed and stable Respiratory status: spontaneous breathing, nonlabored ventilation, respiratory function stable and patient connected to nasal cannula oxygen Cardiovascular status: blood pressure returned to baseline and stable Postop Assessment: no apparent nausea or vomiting Anesthetic complications: no    Last Vitals:  Vitals:   11/06/18 1040 11/06/18 1050  BP: (!) 199/68 (!) 209/74  Pulse: (!) 56 (!) 59  Resp: 15 15  Temp:    SpO2: 100% 99%    Last Pain:  Vitals:   11/06/18 1028  TempSrc:   PainSc: 0-No pain                 Barnet Glasgow

## 2018-11-06 NOTE — Interval H&P Note (Signed)
History and Physical Interval Note:  11/06/2018 9:31 AM  Theresa Harrington  has presented today for surgery, with the diagnosis of BARRETTS.  The various methods of treatment have been discussed with the patient and family. After consideration of risks, benefits and other options for treatment, the patient has consented to  Procedure(s) with comments: ESOPHAGOGASTRODUODENOSCOPY (EGD) WITH PROPOFOL (N/A) - RFA GI RADIOFREQUENCY ABLATION (N/A) as a surgical intervention.  The patient's history has been reviewed, patient examined, no change in status, stable for surgery.  I have reviewed the patient's chart and labs.  Questions were answered to the patient's satisfaction.     [employer]

## 2018-11-06 NOTE — Transfer of Care (Signed)
Immediate Anesthesia Transfer of Care Note  Patient: Theresa Harrington  Procedure(s) Performed: Procedure(s) with comments: ESOPHAGOGASTRODUODENOSCOPY (EGD) WITH PROPOFOL (N/A) - RFA GI RADIOFREQUENCY ABLATION (N/A)  Patient Location: PACU  Anesthesia Type:General  Level of Consciousness:  sedated, patient cooperative and responds to stimulation  Airway & Oxygen Therapy:Patient Spontanous Breathing and Patient connected to face mask oxgen  Post-op Assessment:  Report given to PACU RN and Post -op Vital signs reviewed and stable  Post vital signs:  Reviewed and stable  Last Vitals:  Vitals:   11/06/18 0908  BP: (!) 184/72  Pulse: (!) 58  Resp: 16  Temp: 36.6 C  SpO2: 674%    Complications: No apparent anesthesia complications

## 2018-11-06 NOTE — Anesthesia Procedure Notes (Signed)
Procedure Name: Intubation Date/Time: 11/06/2018 9:50 AM Performed by: Lavina Hamman, CRNA Pre-anesthesia Checklist: Patient identified, Emergency Drugs available, Suction available, Patient being monitored and Timeout performed Patient Re-evaluated:Patient Re-evaluated prior to induction Oxygen Delivery Method: Circle system utilized Preoxygenation: Pre-oxygenation with 100% oxygen Induction Type: IV induction Ventilation: Mask ventilation without difficulty Laryngoscope Size: Mac and 3 Grade View: Grade II Tube type: Oral Tube size: 7.0 mm Number of attempts: 1 Airway Equipment and Method: Stylet Placement Confirmation: ETT inserted through vocal cords under direct vision,  positive ETCO2,  CO2 detector and breath sounds checked- equal and bilateral Secured at: 21 cm Tube secured with: Tape Dental Injury: Teeth and Oropharynx as per pre-operative assessment

## 2018-11-06 NOTE — Progress Notes (Signed)
Thanks so much for your help with this patient.  Well done.  - HD

## 2018-11-06 NOTE — Addendum Note (Signed)
Addended byDebbe Mounts on: 11/06/2018 02:30 PM   Modules accepted: Orders

## 2018-11-06 NOTE — Discharge Instructions (Signed)
YOU HAD AN ENDOSCOPIC PROCEDURE TODAY: Refer to the procedure report and other information in the discharge instructions given to you for any specific questions about what was found during the examination. If this information does not answer your questions, please call [place] office at [phone] to clarify.   YOU SHOULD EXPECT: Some feelings of bloating in the abdomen. Passage of more gas than usual. Walking can help get rid of the air that was put into your GI tract during the procedure and reduce the bloating. If you had a lower endoscopy (such as a colonoscopy or flexible sigmoidoscopy) you may notice spotting of blood in your stool or on the toilet paper. Some abdominal soreness may be present for a day or two, also.  DIET: Your first meal following the procedure should be a light meal and then it is ok to progress to your normal diet. A half-sandwich or bowl of soup is an example of a good first meal. Heavy or fried foods are harder to digest and may make you feel nauseous or bloated. Drink plenty of fluids but you should avoid alcoholic beverages for 24 hours. If you had a esophageal dilation, please see attached instructions for diet.    ACTIVITY: Your care partner should take you home directly after the procedure. You should plan to take it easy, moving slowly for the rest of the day. You can resume normal activity the day after the procedure however YOU SHOULD NOT DRIVE, use power tools, machinery or perform tasks that involve climbing or major physical exertion for 24 hours (because of the sedation medicines used during the test).   SYMPTOMS TO REPORT IMMEDIATELY: A gastroenterologist can be reached at any hour. Please call [phone]  for any of the following symptoms:   Following upper endoscopy (EGD, EUS, ERCP, esophageal dilation) Vomiting of blood or coffee ground material  New, significant abdominal pain  New, significant chest pain or pain under the shoulder blades  Painful or  persistently difficult swallowing  New shortness of breath  Black, tarry-looking or red, bloody stools  FOLLOW UP:  If any biopsies were taken you will be contacted by phone or by letter within the next 1-3 weeks. Call [phone]  if you have not heard about the biopsies in 3 weeks.  Please also call with any specific questions about appointments or follow up tests.  

## 2018-11-07 ENCOUNTER — Telehealth: Payer: Self-pay | Admitting: Gastroenterology

## 2018-11-07 ENCOUNTER — Encounter (HOSPITAL_COMMUNITY): Payer: Self-pay | Admitting: Gastroenterology

## 2018-11-07 NOTE — Telephone Encounter (Signed)
Returned pt call. Had to leave msg.

## 2018-11-08 NOTE — Telephone Encounter (Signed)
Returned pt call. Had to leave msg.

## 2018-11-08 NOTE — Telephone Encounter (Signed)
Patient returned call

## 2018-11-11 ENCOUNTER — Telehealth: Payer: Self-pay

## 2018-11-11 NOTE — Telephone Encounter (Signed)
-----   Message from Irving Copas., MD sent at 11/06/2018 12:08 PM EDT ----- [name], Procedure went well. I suspect patient will need 2-3 sessions for [place] to try and ablate this area. Sandy Blouch please schedule a follow-up in clinic in approximately 3 weeks to check in on how she is doing. Johannes Everage please schedule a repeat endoscopy with ablation in 2.5 to 3 months. Thanks for the referral. GM

## 2018-11-11 NOTE — Telephone Encounter (Signed)
12/10/18 at 2:50 pm appt with Dr Jerilynn Mages to discuss repeat endo. Pt has been notified

## 2018-11-14 DIAGNOSIS — E1151 Type 2 diabetes mellitus with diabetic peripheral angiopathy without gangrene: Secondary | ICD-10-CM | POA: Diagnosis not present

## 2018-11-14 DIAGNOSIS — L97522 Non-pressure chronic ulcer of other part of left foot with fat layer exposed: Secondary | ICD-10-CM | POA: Diagnosis not present

## 2018-11-21 ENCOUNTER — Other Ambulatory Visit: Payer: Self-pay | Admitting: Internal Medicine

## 2018-11-21 ENCOUNTER — Other Ambulatory Visit: Payer: Self-pay | Admitting: Pharmacy Technician

## 2018-11-21 DIAGNOSIS — Z1231 Encounter for screening mammogram for malignant neoplasm of breast: Secondary | ICD-10-CM

## 2018-11-21 NOTE — Patient Outreach (Signed)
[facility] [facility]) Care Management  11/21/2018  Theresa Harrington [birth year] [id number]    Successful outreach call placed to patient. Was returning patient's call as she left me a voicemail message in regards to [employer] application for [place].  Spoke to patient, HIPAA identiifiers verified.  Patient was calling to inquire how to obtain her refills from [place]. Provided patient the phone number to [employer] and where to locate that on her medication bottle.  Patient verbalized understanding.  Theresa Kohen P. Theresa Harrington, [place] Management [phone]

## 2018-11-28 DIAGNOSIS — E1151 Type 2 diabetes mellitus with diabetic peripheral angiopathy without gangrene: Secondary | ICD-10-CM | POA: Diagnosis not present

## 2018-11-28 DIAGNOSIS — L97511 Non-pressure chronic ulcer of other part of right foot limited to breakdown of skin: Secondary | ICD-10-CM | POA: Diagnosis not present

## 2018-12-04 DIAGNOSIS — H401113 Primary open-angle glaucoma, right eye, severe stage: Secondary | ICD-10-CM | POA: Diagnosis not present

## 2018-12-04 DIAGNOSIS — H401121 Primary open-angle glaucoma, left eye, mild stage: Secondary | ICD-10-CM | POA: Diagnosis not present

## 2018-12-04 DIAGNOSIS — D3131 Benign neoplasm of right choroid: Secondary | ICD-10-CM | POA: Diagnosis not present

## 2018-12-04 LAB — HM DIABETES EYE EXAM

## 2018-12-10 ENCOUNTER — Encounter: Payer: Self-pay | Admitting: Gastroenterology

## 2018-12-10 ENCOUNTER — Other Ambulatory Visit: Payer: Self-pay

## 2018-12-10 ENCOUNTER — Ambulatory Visit (INDEPENDENT_AMBULATORY_CARE_PROVIDER_SITE_OTHER): Payer: PPO | Admitting: Gastroenterology

## 2018-12-10 VITALS — BP 92/68 | HR 68 | Temp 97.7°F | Ht 65.0 in | Wt 173.6 lb

## 2018-12-10 DIAGNOSIS — R1310 Dysphagia, unspecified: Secondary | ICD-10-CM

## 2018-12-10 DIAGNOSIS — R1319 Other dysphagia: Secondary | ICD-10-CM | POA: Insufficient documentation

## 2018-12-10 DIAGNOSIS — K22711 Barrett's esophagus with high grade dysplasia: Secondary | ICD-10-CM | POA: Diagnosis not present

## 2018-12-10 DIAGNOSIS — K2100 Gastro-esophageal reflux disease with esophagitis, without bleeding: Secondary | ICD-10-CM

## 2018-12-10 MED ORDER — OMEPRAZOLE 40 MG PO CPDR: 40.0000 mg | DELAYED_RELEASE_CAPSULE | Freq: Two times a day (BID) | ORAL | 2 refills | Status: DC

## 2018-12-10 NOTE — Patient Instructions (Signed)
It has been recommended to you by your physician that you have a(n) EGD with ablation at [facility] in Oct 2020 completed. We did not schedule the procedure(s) today. We will contact you when we have Oct schedule available.   We have sent the following medications to your pharmacy for you to pick up at your convenience: Omeprazole   Thank you for choosing me and [facility].  Dr. Rush Landmark

## 2018-12-10 NOTE — Progress Notes (Signed)
[place] VISIT   Primary Care Provider [name], MD [address] [address] [phone]  Patient Profile: Theresa Harrington is a 63 y.o. female with a pmh significant for Barrett's esophagus (high-grade dysplasia and indeterminate dysplasia) -currently undergoing RFA ablation, GERD with esophagitis, hypertension, hyperlipidemia, peripheral arterial disease, paroxysmal atrial fibrillation (on anticoagulation), diabetes, B12 deficiency.  The patient presents to the [facility] for an evaluation and management of problem(s) noted below:  Problem List 1. Barrett's esophagus with high grade dysplasia   2. Esophageal dysphagia   3. Gastroesophageal reflux disease with esophagitis     History of Present Illness Please see initial consultation note by Dr. Loletha Carrow and my progress note for full details of HPI.   Interval History The patient underwent her endoscopy in July and this is our follow-up since her procedure.  She has done relatively well.  Infrequent dysphagia which has improved significantly since being on PPI therapy.  She states she had some issues initially after the procedure for 3 to 4 days in regards to trying to understand her medications but finally got that under control.  Currently having less acid reflux.  No issues with bleeding post procedure.  Unfortunately, she has lost a few friends due to COVID-19 pandemic and this has overall made things difficult for her from a mental health perspective.  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  However, she wishes times were better.  GI Review of Systems Positive as above Negative for abdominal pain, nausea, vomiting, change in bowel habits, melena, hematochezia  Review of Systems General: Denies fevers/chills/weight loss Cardiovascular: Denies chest pain Pulmonary: Denies shortness of  breath Gastroenterological: See HPI Genitourinary: Denies darkened urine Hematological: Positive for easy bruising/bleeding as result of anticoagulation Dermatological: Denies jaundice Psychological: Mood is stable   Medications Current Outpatient Medications  Medication Sig Dispense Refill   AMBULATORY NON FORMULARY MEDICATION 32m-Viscous lidocaine, 923m10mg/5ml Diyclomine, 27016malox Swish and Swallow 52m66m mouth four times daily. 550 mL 1   Blood Glucose Monitoring Suppl (ONETOUCH VERIO IQ SYSTEM) w/Device KIT USE TO CHECK SUGAR DAILY 1 kit 0   CARTIA XT 120 MG 24 hr capsule Take 1 capsule by mouth once daily (Patient taking differently: Take 120 mg by mouth daily. ) 90 capsule 0   ezetimibe (ZETIA) 10 MG tablet Take 10 mg by mouth at bedtime.      glucose blood test strip Use to check blood sugar twice daily. DX: E11.8 200 each 3   HYDROcodone-acetaminophen (HYCET) 7.5-325 mg/15 ml solution Take 10 mLs by mouth 4 (four) times daily as needed for moderate pain. 120 mL 0   insulin aspart (NOVOLOG) 100 UNIT/ML injection Inject 14-20 Units into the skin 3 (three) times daily before meals.     insulin detemir (LEVEMIR) 100 UNIT/ML injection Inject 80 Units into the skin at bedtime. Use until gone then switch to Basaglar     Insulin Glargine (BASAGLAR KWIKPEN) 100 UNIT/ML SOPN Inject 0.8 mLs (80 Units total) into the skin daily. 9 mL 1   Insulin Pen Needle (PEN NEEDLES) 32G X 4 MM MISC Inject 1 pen as directed 4 (four) times daily. Use to inject levemir or novolog as directed. 400 each 3   latanoprost (XALATAN) 0.005 % ophthalmic solution Place 1 drop into both eyes at bedtime.      losartan-hydrochlorothiazide (HYZAAR) 100-12.5 MG tablet Take 1 tablet  by mouth once daily (Patient taking differently: Take 1 tablet by mouth daily. ) 90 tablet 1   magnesium oxide (MAG-OX) 400 (241.3 Mg) MG tablet TAKE 1 TABLET BY MOUTH ONCE DAILY (Patient taking differently: Take 400 mg by mouth  daily. ) 90 tablet 1   metFORMIN (GLUCOPHAGE) 1000 MG tablet TAKE 1 TABLET BY MOUTH TWICE DAILY WITH MEALS (Patient taking differently: Take 1,000 mg by mouth 2 (two) times a day. ) 180 tablet 0   omeprazole (PRILOSEC) 40 MG capsule Take 1 capsule (40 mg total) by mouth 2 (two) times daily before a meal. 90 capsule 2   potassium chloride SA (K-DUR) 20 MEQ tablet Take 1 tablet (20 mEq total) by mouth 2 (two) times daily. 180 tablet 1   pravastatin (PRAVACHOL) 40 MG tablet Take 1 tablet (40 mg total) by mouth daily. (Patient taking differently: Take 40 mg by mouth at bedtime. ) 90 tablet 3   rivaroxaban (XARELTO) 20 MG TABS tablet Take 1 tablet (20 mg total) by mouth daily with supper. (Patient taking differently: Take 20 mg by mouth daily. ) 90 tablet 3   sucralfate (CARAFATE) 1 GM/10ML suspension Take 10 mLs (1 g total) by mouth 4 (four) times daily. 420 mL 1   Thiamine HCl (VITAMIN B1) 100 MG TABS Take 1 tablet by mouth once daily (Patient taking differently: Take 100 mg by mouth at bedtime. ) 90 tablet 0   TRULICITY 9.37 DS/2.8JG SOPN PLEASE START INJECTING 0.75MG UNDER THE SKIN WEEKLY (Patient taking differently: Inject 0.75 mg as directed every Monday. ) 4 mL 0   No current facility-administered medications for this visit.     Allergies Allergies  Allergen Reactions   Amlodipine Swelling   Atorvastatin Other (See Comments)    Muscle aches    Ampicillin Nausea And Vomiting and Other (See Comments)   Codeine Nausea And Vomiting    Ask patient   Lisinopril Cough        Penicillins Nausea And Vomiting    Did it involve swelling of the face/tongue/throat, SOB, or low BP? No Did it involve sudden or severe rash/hives, skin peeling, or any reaction on the inside of your mouth or nose? No Did you need to seek medical attention at a hospital or doctor's office? No When did it last happen?20+ years If all above answers are NO, may proceed with cephalosporin use.     Januvia [Sitagliptin] Nausea And Vomiting    Histories Past Medical History:  Diagnosis Date   Allergy    Anxiety    Pt. denies   Asymptomatic cholelithiasis    Atherosclerosis of aorta (HCC)    Cataract    Clotting disorder ([place])    Gallstones 06/2013   GERD (gastroesophageal reflux disease)    History of kidney stones    lithotrispy   Hyperlipidemia    Hypertension    Hypothyroidism    no meds   Iron deficiency anemia    PAD (peripheral artery disease) (HCC)    PAF (paroxysmal atrial fibrillation) (HCC)    Peripheral arterial occlusive disease (HCC)    lower extremities   Pneumonia    PONV (postoperative nausea and vomiting)    Right ureteral stone    Type 2 diabetes mellitus ([place])    Type   Vitamin B 12 deficiency    Wears glasses    Past Surgical History:  Procedure Laterality Date   AMPUTATION Left 07/16/2014   Procedure: LEFT SECOND TOE AMPUTATION;  Surgeon: Serafina Mitchell,  MD;  Location: [place];  Service: Vascular;  Laterality: Left;  With Nerve block   AUGMENTATION MAMMAPLASTY     BELPHAROPTOSIS REPAIR     eyelid lift   BIOPSY  10/14/2018   Procedure: BIOPSY;  Surgeon: Rush Landmark Telford Nab., MD;  Location: [place];  Service: Gastroenterology;;   COLONOSCOPY     COMBINED AUGMENTATION MAMMAPLASTY AND ABDOMINOPLASTY  2009   W/  BILATERAL  THIGH LIFT   CYSTO/  RIGHT URETERAL STENT PLACEMENT  12-30-2010   CYSTOSCOPY WITH RETROGRADE PYELOGRAM, URETEROSCOPY AND STENT PLACEMENT Right 10/07/2013   Procedure: CYSTOSCOPY WITH RETROGRADE PYELOGRAM, right URETEROSCOPY AND STENT PLACEMENT, stone extraction;  Surgeon: Arvil Persons, MD;  [facility];  Service: Urology;  Laterality: Right;   CYSTOSCOPY WITH STENT PLACEMENT Right 06/04/2013   Procedure: CYSTOSCOPY WITH STENT PLACEMENT;  Surgeon: Franchot Gallo, MD;  Location: [facility];  Service: Urology;  Laterality: Right;   DILATATION & CURETTAGE/HYSTEROSCOPY WITH  MYOSURE N/A 04/27/2015   Procedure: DILATATION & CURETTAGE/HYSTEROSCOPY WITH MYOSURE;  Surgeon: Terrance Mass, MD;  Location: [facility];  Service: Gynecology;  Laterality: N/A;   ENDARTERECTOMY FEMORAL Left 06/08/2014   Procedure: Left Leg Common Femoral and External Iliac  Endartarectomy with patch Angioplasty;  Surgeon: Rosetta Posner, MD;  Location: [facility];  Service: Vascular;  Laterality: Left;   ESOPHAGOGASTRODUODENOSCOPY N/A 10/14/2018   Procedure: ESOPHAGOGASTRODUODENOSCOPY (EGD);  Surgeon: Irving Copas., MD;  Location: [place];  Service: Gastroenterology;  Laterality: N/A;   ESOPHAGOGASTRODUODENOSCOPY (EGD) WITH PROPOFOL N/A 11/06/2018   Procedure: ESOPHAGOGASTRODUODENOSCOPY (EGD) WITH PROPOFOL;  Surgeon: Rush Landmark Telford Nab., MD;  [facility];  Service: Gastroenterology;  Laterality: N/A;  RFA   EXTRACORPOREAL SHOCK WAVE LITHOTRIPSY Right 08-04-2013//   06-23-2013//   01-16-2011   EYE SURGERY Bilateral    cataract   FEMORAL-POPLITEAL BYPASS GRAFT Left 06/08/2014   Procedure: Left Leg Femoral -Popliteal Bypass Graft;  Surgeon: Rosetta Posner, MD;  Location: [facility] OR;  Service: Vascular;  Laterality: Left;   FEMORAL-POPLITEAL BYPASS GRAFT Left 06/09/2014   Procedure: Left Femoral and Popliteal Exposure; Left Femoral to Anterior Tibial Bypass Graft using Propaten 56m by 80cm Goretex Graft; Left Tibial Endarterectomy; Left Femoraland Popliteal Thrombectomy ;  Surgeon: VSerafina Mitchell MD;  Location: [facility]  Service: Vascular;  Laterality: Left;   GI RADIOFREQUENCY ABLATION N/A 11/06/2018   Procedure: GI RADIOFREQUENCY ABLATION;  Surgeon: MIrving Copas, MD;  [facility];  Service: Gastroenterology;  Laterality: N/A;   HOLMIUM LASER APPLICATION Right 66/06/2295  Procedure: HOLMIUM LASER APPLICATION;  Surgeon: MArvil Persons MD;  [facility]  Service: Urology;  Laterality: Right;   KIDNEY STONE SURGERY  April 2015   1-2 stones    LAPAROSCOPIC GASTRIC BANDING  05-29-2005   LITHOTRIPSY  2-3 times   LOWER EXTREMITY ANGIOGRAM N/A 06/04/2014   Procedure: LOWER EXTREMITY ANGIOGRAM;  Surgeon: VSerafina Mitchell MD;  [facility];  Service: Cardiovascular;  Laterality: N/A;   ORIF FIFTH METACARPAL FX  RIGHT HAND  04-21-2002   REVISION AND RE-SITING LAP-BAND PORT  04-08-2010   W/  UPPER EGD   RIGHT KNEE PATELLECTOMY W/ REPAIR OF EXTENSOR MECHANISM  04-14-2002   Toenail removed Left Jan. 21, 2016   2nd toenail-  Dr. PBarkley Bruns  Social History   Socioeconomic History   Marital status: Single    Spouse name: Not on file   Number of children: Not on file   Years of education:  Not on file   Highest education level: Not on file  Occupational History   Occupation: Quarry manager: [employer] resource strain: Not on file   Food insecurity    Worry: Not on file    Inability: Not on file   Transportation needs    Medical: Not on file    Non-medical: Not on file  Tobacco Use   Smoking status: Former Smoker    Packs/day: 1.00    Years: 28.00    Pack years: 28.00    Types: Cigarettes    Quit date: 09/05/2001    Years since quitting: 17.2   Smokeless tobacco: Never Used  Substance and Sexual Activity   Alcohol use: No    Alcohol/week: 0.0 standard drinks   Drug use: No   Sexual activity: Yes  Lifestyle   Physical activity    Days per week: Not on file    Minutes per session: Not on file   Stress: Not on file  Relationships   Social connections    Talks on phone: Not on file    Gets together: Not on file    Attends religious service: Not on file    Active member of club or organization: Not on file    Attends meetings of clubs or organizations: Not on file    Relationship status: Not on file   Intimate partner violence    Fear of current or ex partner: Not on file    Emotionally abused: Not on file    Physically abused: Not on file    Forced  sexual activity: Not on file  Other Topics Concern   Not on file  Social History Narrative   Regular exercise- yes   Family History  Problem Relation Age of Onset   Diabetes Father    Heart disease Father    Deep vein thrombosis Father    Hyperlipidemia Father    Diabetes Sister    Heart disease Sister    Deep vein thrombosis Sister    Hyperlipidemia Sister    Diabetes Brother    Heart disease Brother    Cancer Brother    Hyperlipidemia Brother    Colon cancer Maternal Aunt    Diabetes Brother    Diabetes Brother    Kidney disease Mother    Hyperlipidemia Mother    Other Mother        AAA   and    Amputation   Arthritis Other    Cancer Other        colon   Hypertension Other    Stroke Other    Esophageal cancer Neg Hx    Stomach cancer Neg Hx    Inflammatory bowel disease Neg Hx    Liver disease Neg Hx    Pancreatic cancer Neg Hx    I have reviewed her medical, social, and family history in detail and updated the electronic medical record as necessary.    PHYSICAL EXAMINATION  BP 92/68    Pulse 68    Temp 97.7 F (36.5 C) (Oral)    Ht _0  (1.651 m)    Wt 173 lb 9.6 oz (78.7 kg)    LMP  (LMP Unknown)    BMI 28.89 kg/m  GEN: NAD, appears stated age, doesn't appear chronically ill PSYCH: Cooperative, without pressured speech EYE: Conjunctivae pink, sclerae anicteric ENT: MMM CV: RR without R/Gs  RESP: CTAB posteriorly, without wheezing GI: NABS, soft, NT/ND,  without rebound or guarding, no HSM appreciated MSK/EXT: No lower extremity edema SKIN: No jaundice NEURO:  Alert & Oriented x 3, no focal deficits    REVIEW OF DATA  I reviewed the following data at the time of this encounter:  GI Procedures and Studies  July 2020 EGD - No gross lesions in proximal/middle esophagus. - LA Grade B esophagitis. - Esophageal mucosal changes secondary to established short-segment Barrett's disease. Treated with radiofrequency ablation. - 3  cm hiatal hernia. Foodstuffs found in hernia sac - removed before ablation performed. - Erythematous mucosa in the antrum. - No gross lesions in the stomach. - No gross lesions in the duodenal bulb, in the first portion of the duodenum and in the second portion of the duodenum.  Laboratory Studies  Reviewed in epic  Imaging Studies  No relevant studies to review    ASSESSMENT  Ms. Wendell is a 63 y.o. female with a pmh significant for Barrett's esophagus (high-grade dysplasia and indeterminate dysplasia) -currently undergoing RFA ablation, GERD with esophagitis, hypertension, hyperlipidemia, peripheral arterial disease, paroxysmal atrial fibrillation (on anticoagulation), diabetes, B12 deficiency.  The patient is seen today for evaluation and management of:  1. Barrett's esophagus with high grade dysplasia   2. Esophageal dysphagia   3. Gastroesophageal reflux disease with esophagitis    The patient is doing well post RFA ablation.  She does have mild dysphagia symptoms but overall since initiation of PPI therapy these are better.  She has not had to regurgitate anywhere as frequently.  Liquids passed without problems.  At this point in time, I believe the patient remains an appropriate candidate for RFA ablation; in an effort of trying to diminish prior history of Barrett's high-grade dysplasia.  I plan to relook in a 23-monthinterval via endoscopy.  My goal is to try to get complete eradication of dysplasia and intestinal metaplasia.  If there is evidence of nodularity or other concerning findings we can proceed with attempted EMR.  As it is known approximately 10% of patients may not respond completely to endoscopic ablation therapy but we should attempt to try to control this as best we can to minimize risk of developing esophageal adenocarcinoma.  I am hopeful that 2 more sessions will allow u[place]ato get this treated.  Endoscopic therapy alone does not completely eliminate the risk of developing  esophageal cancer and we also re-discussed the potential complications of endoluminal RFA ablation therapy which includes, but is not limited to: perforation, bleeding, and stricture formation.  The risks and benefits of endoscopic evaluation were re-discussed with the patient; including, but not limited to, the risk of perforation, infection, bleeding, missed lesions, lack of diagnosis, severe illness requiring hospitalization, as well as anesthesia and sedation related illnesses.  The patient is agreeable to proceed.  All patient questions were answered, to the best of my ability, and the patient agrees to the aforementioned plan of action with follow-up as indicated.   PLAN  Continue PPI therapy twice daily Move forward with EGD with RFA to be scheduled in October for 342-monthnterval 1 to 2 weeks before procedure will have her medications sent to pharmacy   No orders of the defined types were placed in this encounter.   New Prescriptions   No medications on file   Modified Medications   Modified Medication Previous Medication   OMEPRAZOLE (PRILOSEC) 40 MG CAPSULE omeprazole (PRILOSEC) 40 MG capsule      Take 1 capsule (40 mg total) by mouth 2 (two) times  daily before a meal.    Take 1 capsule (40 mg total) by mouth 2 (two) times daily before a meal.    Planned Follow Up No follow-ups on file.   Justice Britain, MD [facility] Office # [phone]

## 2018-12-12 ENCOUNTER — Other Ambulatory Visit: Payer: Self-pay | Admitting: Pharmacy Technician

## 2018-12-12 DIAGNOSIS — L97521 Non-pressure chronic ulcer of other part of left foot limited to breakdown of skin: Secondary | ICD-10-CM | POA: Diagnosis not present

## 2018-12-12 DIAGNOSIS — E1151 Type 2 diabetes mellitus with diabetic peripheral angiopathy without gangrene: Secondary | ICD-10-CM | POA: Diagnosis not present

## 2018-12-12 NOTE — Patient Outreach (Signed)
[name]) Care Management  12/12/2018  Theresa Harrington [birth year] [id number]    Incoming call received from patient in regards to Trulicity refill with [employer].  Spoke to patient, HIPAA identifiers verified.  Patient was calling to inquire if we were able to get through to [facility] quicker than what the patient could.  Informed patient that unfortunately hold times at this time of year can be quite long with [employer]. Informed patient to try and call first thing in the morning or later in the evening. Patient informed she had been trying to call [facility] and inquired if there was another number she could call. Informed patient that she could call [employer] and ask to be transferred to [employer] as sometimes the hold times are less. Provided patient with that phone number. Patient informed she would try that number to order her refill of Trulicity.  Tramond Slinker P. Keatin Benham, [place] Management [phone]

## 2018-12-13 ENCOUNTER — Other Ambulatory Visit: Payer: Self-pay | Admitting: Pharmacy Technician

## 2018-12-13 NOTE — Patient Outreach (Signed)
[facility]) Care Management  12/13/2018  Kaelynn Igo 01-28-1956 [id number]    ADDENDUM  Successful outreach call placed to patient in regards to [place] patient assistance application for Trulicity.  Spoke to patient, HIPAA identifiers verified.  Informed patient that the companies, [place], were in need of updated script for the Trulicity. Informed patient that [place] faxed over a refill request form to the provider's office per rep Caryl Pina that I spoke to. Informed patient that I sent Dr. [name] an inbasket message requesting the script be faxed into [facility] per rep [facility] that I spoke to.  Informed patient that she may need to followup with the provider's office to inquire about samples since she informed she was of medication. Patient verbalized understanding.  Confirmed patient had name and number if she had any more issues with trying to get her refill.  Deneshia Zucker P. Janal Haak, [place] Management [phone]

## 2018-12-13 NOTE — Patient Outreach (Signed)
[facility]) Care Management  12/13/2018  Theresa Harrington 1955/06/12 [id number]   Care coordination call placed to [place] in regards to patient's refill for Trulicity.  Spoke to [place] who informed they had no record of patient receiving Trulicity. She informed that [employer] would need to send over the application to [employer]. She also informed that a script would need to be sent in for the Trulicity by faxing [facility] at [phone]. Sent patient's provider Dr. Scarlette Calico an inbasket message requesting this be done for the patient.  Care coordination call placed to [place] in regards to the information provided by [employer].   Spoke to [place] who informed that she would send the refill request forms over to Dr. [name]' office. She informed once they send it back then she will forward to [employer]. She informed that [facility] should not need the original application.  Will call and update patient on the above information.  Seville Brick P. Ovila Lepage, [place] Management [phone]

## 2018-12-14 ENCOUNTER — Other Ambulatory Visit: Payer: Self-pay | Admitting: Internal Medicine

## 2018-12-14 DIAGNOSIS — IMO0002 Reserved for concepts with insufficient information to code with codable children: Secondary | ICD-10-CM

## 2018-12-14 DIAGNOSIS — E1151 Type 2 diabetes mellitus with diabetic peripheral angiopathy without gangrene: Secondary | ICD-10-CM

## 2018-12-14 MED ORDER — INSULIN ASPART 100 UNIT/ML ~~LOC~~ SOLN: 14.0000 [IU] | Freq: Three times a day (TID) | SUBCUTANEOUS | 2 refills | Status: DC

## 2018-12-14 MED ORDER — TRULICITY 0.75 MG/0.5ML ~~LOC~~ SOAJ: 0.7500 mg | SUBCUTANEOUS | 1 refills | Status: DC

## 2018-12-14 MED ORDER — BASAGLAR KWIKPEN 100 UNIT/ML ~~LOC~~ SOPN: 80.0000 [IU] | PEN_INJECTOR | Freq: Every day | SUBCUTANEOUS | 1 refills | Status: DC

## 2018-12-19 ENCOUNTER — Telehealth: Payer: Self-pay | Admitting: Internal Medicine

## 2018-12-19 DIAGNOSIS — E1151 Type 2 diabetes mellitus with diabetic peripheral angiopathy without gangrene: Secondary | ICD-10-CM

## 2018-12-19 DIAGNOSIS — IMO0002 Reserved for concepts with insufficient information to code with codable children: Secondary | ICD-10-CM

## 2018-12-19 MED ORDER — BASAGLAR KWIKPEN 100 UNIT/ML ~~LOC~~ SOPN: 80.0000 [IU] | PEN_INJECTOR | Freq: Every day | SUBCUTANEOUS | 1 refills | Status: DC

## 2018-12-19 NOTE — Telephone Encounter (Signed)
Pharmacy needed to clarify the Rx for Insulin Glargine (BASAGLAR KWIKPEN) 100 UNIT/ML SOPN  It was written for 26ml which is not enough of a supply.  The pharmacy gives these in boxes of 5 pens, which is 79ml.  So for a 4 month supply the Pt would need 7 boxes, which is 35 pens / please advise and call this number for clarification [phone]

## 2018-12-19 NOTE — Telephone Encounter (Signed)
Resent rx for 15 ml and 1 refill to [facility].

## 2018-12-26 ENCOUNTER — Other Ambulatory Visit: Payer: Self-pay

## 2018-12-26 DIAGNOSIS — I779 Disorder of arteries and arterioles, unspecified: Secondary | ICD-10-CM

## 2018-12-27 ENCOUNTER — Telehealth (HOSPITAL_COMMUNITY): Payer: Self-pay | Admitting: *Deleted

## 2018-12-27 NOTE — Telephone Encounter (Signed)
The above patient or their representative was contacted and gave the following answers to these questions:         Do you have any of the following symptoms?n  Fever                    Cough                   Shortness of breath  Do  you have any of the following other symptoms?    muscle pain         vomiting,        diarrhea        rash         weakness        red eye        abdominal pain         bruising          bruising or bleeding              joint pain           severe headache    Have you been in contact with someone who was or has been sick in the past 2 weeks?n Yes                 Unsure                         Unable to assess   Does the person that you were in contact with have any of the following symptoms?   Cough         shortness of breath           muscle pain         vomiting,            diarrhea            rash            weakness           fever            red eye           abdominal pain           bruising  or  bleeding                joint pain                severe headache               Have you  or someone you have been in contact with traveled internationally in th last month?         If yes, which countries?   Have you  or someone you have been in contact with traveled outside [place] in th last month?         If yes, which state and city?   COMMENTS OR ACTION PLAN FOR THIS PATIENT:

## 2018-12-30 ENCOUNTER — Ambulatory Visit (INDEPENDENT_AMBULATORY_CARE_PROVIDER_SITE_OTHER)
Admission: RE | Admit: 2018-12-30 | Discharge: 2018-12-30 | Disposition: A | Discharge status: Home / Self Care | Payer: PPO | Source: Ambulatory Visit | Attending: Surgery | Admitting: Surgery

## 2018-12-30 ENCOUNTER — Other Ambulatory Visit: Payer: Self-pay | Admitting: Pharmacy Technician

## 2018-12-30 ENCOUNTER — Ambulatory Visit (INDEPENDENT_AMBULATORY_CARE_PROVIDER_SITE_OTHER): Payer: PPO | Admitting: Surgery

## 2018-12-30 ENCOUNTER — Ambulatory Visit (HOSPITAL_COMMUNITY)
Admission: RE | Admit: 2018-12-30 | Discharge: 2018-12-30 | Disposition: A | Discharge status: Home / Self Care | Payer: PPO | Source: Ambulatory Visit | Attending: Surgery | Admitting: Surgery

## 2018-12-30 ENCOUNTER — Encounter: Payer: Self-pay | Admitting: Surgery

## 2018-12-30 ENCOUNTER — Other Ambulatory Visit: Payer: Self-pay

## 2018-12-30 VITALS — BP 184/78 | HR 61 | Temp 97.1°F | Resp 20 | Ht 65.0 in | Wt 175.3 lb

## 2018-12-30 DIAGNOSIS — I779 Disorder of arteries and arterioles, unspecified: Secondary | ICD-10-CM

## 2018-12-30 DIAGNOSIS — I70213 Atherosclerosis of native arteries of extremities with intermittent claudication, bilateral legs: Secondary | ICD-10-CM | POA: Diagnosis not present

## 2018-12-30 NOTE — Progress Notes (Signed)
Vascular and Vein Specialist of [facility]  Patient name: Theresa Harrington MRN: [id number] DOB: 01-28-1956 Sex: female   REASON FOR VISIT:    Follow up  [place]:   This is a 63 y.o. female who is initially underwent left femoral-popliteal bypass graft by Dr. early on 02/06/2015. Unfortunately this occluded early. I took her back to the operating room on 06/09/2014 and redid her operation with Gore-Tex, 6 mm propatent. She was ultimately discharged from the hospital on IV antibiotics. She then began hyperbaric oxygen treatment. She ultimately developed necrotic changes to the left second toe. 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 Doppler studies in December 2016 reveal I passed graft occlusion. Her ABI was 0.68.  She was last seen in our office by the nurse practitioner and everything remained stable  She has had a small ulcer on her right great toe which has improved over the past several weeks.  She continues to struggle with control of her diabetes.  Her last A1c was 7.5.  She remains on statin therapy.  PAST MEDICAL HISTORY:   Past Medical History:  Diagnosis Date  . Allergy   . Anxiety    Pt. denies  . Asymptomatic cholelithiasis   . Atherosclerosis of aorta ([place])   . Cataract   . Clotting disorder ([place])   . Gallstones 06/2013  . GERD (gastroesophageal reflux disease)   . History of kidney stones    lithotrispy  . Hyperlipidemia   . Hypertension   . Hypothyroidism    no meds  . Iron deficiency anemia   . PAD (peripheral artery disease) ([place])   . PAF (paroxysmal atrial fibrillation) ([place])   . Peripheral arterial occlusive disease (HCC)    lower extremities  . Pneumonia   . PONV (postoperative nausea and vomiting)   . Right ureteral stone   . Type 2 diabetes mellitus (HCC)    Type  . Vitamin B 12 deficiency   . Wears glasses      FAMILY HISTORY:   Family History  Problem Relation Age of  Onset  . Diabetes Father   . Heart disease Father   . Deep vein thrombosis Father   . Hyperlipidemia Father   . Diabetes Sister   . Heart disease Sister   . Deep vein thrombosis Sister   . Hyperlipidemia Sister   . Diabetes Brother   . Heart disease Brother   . Cancer Brother   . Hyperlipidemia Brother   . Colon cancer Maternal Aunt   . Diabetes Brother   . Diabetes Brother   . Kidney disease Mother   . Hyperlipidemia Mother   . Other Mother        AAA   and    Amputation  . Arthritis Other   . Cancer Other        colon  . Hypertension Other   . Stroke Other   . Esophageal cancer Neg Hx   . Stomach cancer Neg Hx   . Inflammatory bowel disease Neg Hx   . Liver disease Neg Hx   . Pancreatic cancer Neg Hx     SOCIAL HISTORY:   Social History   Tobacco Use  . Smoking status: Former Smoker    Packs/day: 1.00    Years: 28.00    Pack years: 28.00    Types: Cigarettes    Quit date: 09/05/2001    Years since quitting: 17.3  . Smokeless tobacco: Never Used  Substance Use Topics  .  Alcohol use: No    Alcohol/week: 0.0 standard drinks     ALLERGIES:   Allergies  Allergen Reactions  . Amlodipine Swelling  . Atorvastatin Other (See Comments)    Muscle aches   . Ampicillin Nausea And Vomiting and Other (See Comments)  . Codeine Nausea And Vomiting    Ask patient  . Lisinopril Cough       . Penicillins Nausea And Vomiting    Did it involve swelling of the face/tongue/throat, SOB, or low BP? No Did it involve sudden or severe rash/hives, skin peeling, or any reaction on the inside of your mouth or nose? No Did you need to seek medical attention at a hospital or doctor's office? No When did it last happen?20+ years If all above answers are "NO", may proceed with cephalosporin use.   [name] [Sitagliptin] Nausea And Vomiting     CURRENT MEDICATIONS:   Current Outpatient Medications  Medication Sig Dispense Refill  . AMBULATORY NON FORMULARY MEDICATION  59m-Viscous lidocaine, 943m10mg/5ml Diyclomine, 27072malox Swish and Swallow 62m59m mouth four times daily. 550 mL 1  . Blood Glucose Monitoring Suppl (ONETOUCH VERIO IQ SYSTEM) w/Device KIT USE TO CHECK SUGAR DAILY 1 kit 0  . CARTIA XT 120 MG 24 hr capsule Take 1 capsule by mouth once daily (Patient taking differently: Take 120 mg by mouth daily. ) 90 capsule 0  . Dulaglutide (TRULICITY) 0.753.470QQ/5.9DGN Inject 0.75 mg as directed every Monday. 12 pen 1  . ezetimibe (ZETIA) 10 MG tablet Take 10 mg by mouth at bedtime.     . glMarland Kitchencose blood test strip Use to check blood sugar twice daily. DX: E11.8 200 each 3  . insulin aspart (NOVOLOG) 100 UNIT/ML injection Inject 14-20 Units into the skin 3 (three) times daily before meals. 10 mL 2  . insulin detemir (LEVEMIR) 100 UNIT/ML injection Inject 80 Units into the skin at bedtime. Use until gone then switch to [employer]. Insulin Glargine (BASAGLAR KWIKPEN) 100 UNIT/ML SOPN Inject 0.8 mLs (80 Units total) into the skin daily. 15 mL 1  . Insulin Pen Needle (PEN NEEDLES) 32G X 4 MM MISC Inject 1 pen as directed 4 (four) times daily. Use to inject levemir or novolog as directed. 400 each 3  . latanoprost (XALATAN) 0.005 % ophthalmic solution Place 1 drop into both eyes at bedtime.     . loMarland Kitchenartan-hydrochlorothiazide (HYZAAR) 100-12.5 MG tablet Take 1 tablet by mouth once daily (Patient taking differently: Take 1 tablet by mouth daily. ) 90 tablet 1  . magnesium oxide (MAG-OX) 400 (241.3 Mg) MG tablet TAKE 1 TABLET BY MOUTH ONCE DAILY (Patient taking differently: Take 400 mg by mouth daily. ) 90 tablet 1  . metFORMIN (GLUCOPHAGE) 1000 MG tablet TAKE 1 TABLET BY MOUTH TWICE DAILY WITH MEALS (Patient taking differently: Take 1,000 mg by mouth 2 (two) times a day. ) 180 tablet 0  . omeprazole (PRILOSEC) 40 MG capsule Take 1 capsule (40 mg total) by mouth 2 (two) times daily before a meal. 90 capsule 2  . potassium chloride SA (K-DUR) 20 MEQ tablet Take 1  tablet (20 mEq total) by mouth 2 (two) times daily. 180 tablet 1  . pravastatin (PRAVACHOL) 40 MG tablet Take 1 tablet (40 mg total) by mouth daily. (Patient taking differently: Take 40 mg by mouth at bedtime. ) 90 tablet 3  . rivaroxaban (XARELTO) 20 MG TABS tablet Take 1 tablet (20 mg total) by mouth daily with supper. (  Patient taking differently: Take 20 mg by mouth daily. ) 90 tablet 3  . sucralfate (CARAFATE) 1 GM/10ML suspension Take 10 mLs (1 g total) by mouth 4 (four) times daily. 420 mL 1  . Thiamine HCl (VITAMIN B1) 100 MG TABS Take 1 tablet by mouth once daily (Patient taking differently: Take 100 mg by mouth at bedtime. ) 90 tablet 0   No current facility-administered medications for this visit.     REVIEW OF SYSTEMS:   '[X]'  denotes positive finding, '[ ]'  denotes negative finding Cardiac  Comments:  Chest pain or chest pressure:    Shortness of breath upon exertion:    Short of breath when lying flat:    Irregular heart rhythm:        Vascular    Pain in calf, thigh, or hip brought on by ambulation:    Pain in feet at night that wakes you up from your sleep:     Blood clot in your veins:    Leg swelling:         Pulmonary    Oxygen at home:    Productive cough:     Wheezing:         Neurologic    Sudden weakness in arms or legs:     Sudden numbness in arms or legs:     Sudden onset of difficulty speaking or slurred speech:    Temporary loss of vision in one eye:     Problems with dizziness:         Gastrointestinal    Blood in stool:     Vomited blood:         Genitourinary    Burning when urinating:     Blood in urine:        Psychiatric    Major depression:         Hematologic    Bleeding problems:    Problems with blood clotting too easily:        Skin    Rashes or ulcers: x       Constitutional    Fever or chills:      PHYSICAL EXAM:   There were no vitals filed for this visit.  GENERAL: The patient is a well-nourished female, in no acute  distress. The vital signs are documented above. CARDIAC: There is a regular rate and rhythm.  VASCULAR: Nonpalpable pedal pulses PULMONARY: Non-labored respirations  MUSCULOSKELETAL: There are no major deformities or cyanosis. NEUROLOGIC: No focal weakness or paresthesias are detected. SKIN: 2 mm eschar on the right great toe which has nearly healed without drainage or erythema PSYCHIATRIC: The patient has a normal affect.  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  Suboptimal exam, unable to thoroughtly visuize the right iliac arteries.  MEDICAL ISSUES:   The patient's claudication symptoms remain stable.  Her ABIs have slightly improved today.  No intervention is recommended.  She will follow-up in 1 year with surveillance  imaging including ABIs, lower extremity duplex, and aortoiliac duplex.  The patient has a small nearly healed ulcer on her right great toe.  I suspect this will resolve over the next month or so.  She knows to contact me if this changes.    Leia Alf, MD, FACS Vascular and Vein Specialists of [facility] [phone] Pager [phone]

## 2018-12-30 NOTE — Patient Outreach (Signed)
[facility]) Care Management  12/30/2018  Dolories Hillhouse 1955-12-09 [id number]    Incoming call voicemail received from [facility] at Dr. [name]' office in regards to refill of Trulicity for patient's [facility] application.  Steffanie left me a voicemail informing the form that I was faxing to her could be sent to [phone] which is a direct fax to Dr. [name] or a call could be returned to her at [phone].  Sent Steffanie an inbasket message clarifying that Ralph Leyden was supposed to be faxing there provider. Provided in the message where she could locate the form. Also informed that I could fax the form when I am back in the office tomorrow. Will wait a return response to determine how to proceed.  Plan:await a message back from [facility] at Dr. Stevenson Clinch office.  Nelsie Domino P. Jorgina Binning, [place] Management [phone]

## 2019-01-01 ENCOUNTER — Other Ambulatory Visit: Payer: Self-pay | Admitting: Pharmacy Technician

## 2019-01-01 NOTE — Patient Outreach (Signed)
[facility]) Care Management  01/01/2019  Deangela Bogucki 1955/06/10 [id number]    Received in basket message from [place] refill form for Trulicity was filled out and sent back.  Submitted refill form on behalf of provider to both [place] and [employer]. Once they receive and process the information they will ship out the medication to the patient's home. They may or may not call to set up shipment. Patient aware.  Derian Pfost P. Emma Birchler, [place] Management [phone]

## 2019-01-08 ENCOUNTER — Encounter: Payer: Self-pay | Admitting: Internal Medicine

## 2019-01-08 ENCOUNTER — Other Ambulatory Visit: Payer: Self-pay

## 2019-01-08 ENCOUNTER — Ambulatory Visit: Admission: RE | Admit: 2019-01-08 | Payer: PPO | Source: Ambulatory Visit

## 2019-01-08 ENCOUNTER — Ambulatory Visit (INDEPENDENT_AMBULATORY_CARE_PROVIDER_SITE_OTHER): Payer: PPO | Admitting: Internal Medicine

## 2019-01-08 ENCOUNTER — Other Ambulatory Visit (INDEPENDENT_AMBULATORY_CARE_PROVIDER_SITE_OTHER): Payer: PPO

## 2019-01-08 VITALS — BP 148/80 | HR 56 | Temp 97.6°F | Ht 65.0 in | Wt 176.0 lb

## 2019-01-08 DIAGNOSIS — R310 Gross hematuria: Secondary | ICD-10-CM

## 2019-01-08 DIAGNOSIS — E1165 Type 2 diabetes mellitus with hyperglycemia: Secondary | ICD-10-CM | POA: Diagnosis not present

## 2019-01-08 DIAGNOSIS — E538 Deficiency of other specified B group vitamins: Secondary | ICD-10-CM | POA: Diagnosis not present

## 2019-01-08 DIAGNOSIS — E1151 Type 2 diabetes mellitus with diabetic peripheral angiopathy without gangrene: Secondary | ICD-10-CM

## 2019-01-08 DIAGNOSIS — Z23 Encounter for immunization: Secondary | ICD-10-CM | POA: Diagnosis not present

## 2019-01-08 DIAGNOSIS — IMO0002 Reserved for concepts with insufficient information to code with codable children: Secondary | ICD-10-CM

## 2019-01-08 DIAGNOSIS — G63 Polyneuropathy in diseases classified elsewhere: Secondary | ICD-10-CM

## 2019-01-08 DIAGNOSIS — N3001 Acute cystitis with hematuria: Secondary | ICD-10-CM | POA: Diagnosis not present

## 2019-01-08 LAB — BASIC METABOLIC PANEL
BUN: 13 mg/dL (ref 6–23)
CO2: 30 mEq/L (ref 19–32)
Calcium: 9.7 mg/dL (ref 8.4–10.5)
Chloride: 100 mEq/L (ref 96–112)
Creatinine, Ser: 0.76 mg/dL (ref 0.40–1.20)
GFR: 93.34 mL/min (ref >60.00)
Glucose, Bld: 145 mg/dL — ABNORMAL HIGH (ref 70–99)
Potassium: 3.6 mEq/L (ref 3.5–5.1)
Sodium: 138 mEq/L (ref 135–145)

## 2019-01-08 LAB — URINALYSIS, ROUTINE W REFLEX MICROSCOPIC
Bilirubin Urine: NEGATIVE
Ketones, ur: NEGATIVE
Nitrite: NEGATIVE
Specific Gravity, Urine: >=1.03 — AB (ref 1.000–1.030)
Urine Glucose: NEGATIVE
Urobilinogen, UA: 0.2 (ref 0.0–1.0)
pH: 6 (ref 5.0–8.0)

## 2019-01-08 LAB — HEMOGLOBIN A1C: Hgb A1c MFr Bld: 8.4 % — ABNORMAL HIGH (ref 4.6–6.5)

## 2019-01-08 MED ORDER — SULFAMETHOXAZOLE-TRIMETHOPRIM 800-160 MG PO TABS: 1.0000 | ORAL_TABLET | Freq: Two times a day (BID) | ORAL | 0 refills | Status: DC

## 2019-01-08 MED ORDER — CYANOCOBALAMIN 1000 MCG/ML IJ SOLN
1000.0000 ug | Freq: Once | INTRAMUSCULAR | Status: AC
  Administered 2019-01-08: 11:00:00 1000 ug via INTRAMUSCULAR

## 2019-01-08 NOTE — Patient Instructions (Signed)
Hematuria, Adult Hematuria is blood in the urine. Blood may be visible in the urine, or it may be identified with a test. This condition can be caused by infections of the bladder, urethra, kidney, or prostate. Other possible causes include:  Kidney stones.  Cancer of the urinary tract.  Too much calcium in the urine.  Conditions that are passed from parent to child (inherited conditions).  Exercise that requires a lot of energy. Infections can usually be treated with medicine, and a kidney stone usually will pass through your urine. If neither of these is the cause of your hematuria, more tests may be needed to identify the cause of your symptoms. It is very important to tell your health care provider about any blood in your urine, even if it is painless or the blood stops without treatment. Blood in the urine, when it happens and then stops and then happens again, can be a symptom of a very serious condition, including cancer. There is no pain in the initial stages of many urinary cancers. Follow these instructions at home: Medicines  Take over-the-counter and prescription medicines only as told by your health care provider.  If you were prescribed an antibiotic medicine, take it as told by your health care provider. Do not stop taking the antibiotic even if you start to feel better. Eating and drinking  Drink enough fluid to keep your urine clear or pale yellow. It is recommended that you drink 3-4 quarts (2.8-3.8 L) a day. If you have been diagnosed with an infection, it is recommended that you drink cranberry juice in addition to large amounts of water.  Avoid caffeine, tea, and carbonated beverages. These tend to irritate the bladder.  Avoid alcohol because it may irritate the prostate (men). General instructions  If you have been diagnosed with a kidney stone, follow your health care provider's instructions about straining your urine to catch the stone.  Empty your bladder  often. Avoid holding urine for long periods of time.  If you are female: ? After a bowel movement, wipe from front to back and use each piece of toilet paper only once. ? Empty your bladder before and after sex.  Pay attention to any changes in your symptoms. Tell your health care provider about any changes or any new symptoms.  It is your responsibility to get your test results. Ask your health care provider, or the department performing the test, when your results will be ready.  Keep all follow-up visits as told by your health care provider. This is important. Contact a health care provider if:  You develop back pain.  You have a fever.  You have nausea or vomiting.  Your symptoms do not improve after 3 days.  Your symptoms get worse. Get help right away if:  You develop severe vomiting and are unable take medicine without vomiting.  You develop severe pain in your back or abdomen even though you are taking medicine.  You pass a large amount of blood in your urine.  You pass blood clots in your urine.  You feel very weak or like you might faint.  You faint. Summary  Hematuria is blood in the urine. It has many possible causes.  It is very important that you tell your health care provider about any blood in your urine, even if it is painless or the blood stops without treatment.  Take over-the-counter and prescription medicines only as told by your health care provider.  Drink enough fluid to keep   your urine clear or pale yellow. This information is not intended to replace advice given to you by your health care provider. Make sure you discuss any questions you have with your health care provider. Document Released: 04/17/2005 Document Revised: 03/30/2017 Document Reviewed: 05/20/2016 Elsevier Patient Education  2020 [employer]  

## 2019-01-08 NOTE — Progress Notes (Signed)
Subjective:  Patient ID: Theresa Harrington, female    DOB: 09/27/1955  Age: 63 y.o. MRN: [id number]  CC: Hypertension, Diabetes, and Urinary Tract Infection   HPI Theresa Harrington presents for concerns about a 3-day history of gross hematuria.  She denies abdominal pain, flank pain, nausea, vomiting, fever, or chills.  Outpatient Medications Prior to Visit  Medication Sig Dispense Refill  . AMBULATORY NON FORMULARY MEDICATION 60m-Viscous lidocaine, 953m10mg/5ml Diyclomine, 27047malox Swish and Swallow 71m72m mouth four times daily. 550 mL 1  . Blood Glucose Monitoring Suppl (ONETOUCH VERIO IQ SYSTEM) w/Device KIT USE TO CHECK SUGAR DAILY 1 kit 0  . CARTIA XT 120 MG 24 hr capsule Take 1 capsule by mouth once daily (Patient taking differently: Take 120 mg by mouth daily. ) 90 capsule 0  . Dulaglutide (TRULICITY) 0.758.290FA/2.1HYN Inject 0.75 mg as directed every Monday. 12 pen 1  . ezetimibe (ZETIA) 10 MG tablet Take 10 mg by mouth at bedtime.     . glMarland Kitchencose blood test strip Use to check blood sugar twice daily. DX: E11.8 200 each 3  . insulin aspart (NOVOLOG) 100 UNIT/ML injection Inject 14-20 Units into the skin 3 (three) times daily before meals. 10 mL 2  . insulin detemir (LEVEMIR) 100 UNIT/ML injection Inject 80 Units into the skin at bedtime. Use until gone then switch to [employer]. Insulin Glargine (BASAGLAR KWIKPEN) 100 UNIT/ML SOPN Inject 0.8 mLs (80 Units total) into the skin daily. 15 mL 1  . Insulin Pen Needle (PEN NEEDLES) 32G X 4 MM MISC Inject 1 pen as directed 4 (four) times daily. Use to inject levemir or novolog as directed. 400 each 3  . latanoprost (XALATAN) 0.005 % ophthalmic solution Place 1 drop into both eyes at bedtime.     . loMarland Kitchenartan-hydrochlorothiazide (HYZAAR) 100-12.5 MG tablet Take 1 tablet by mouth once daily (Patient taking differently: Take 1 tablet by mouth daily. ) 90 tablet 1  . magnesium oxide (MAG-OX) 400 (241.3 Mg) MG tablet TAKE 1 TABLET BY MOUTH ONCE DAILY  (Patient taking differently: Take 400 mg by mouth daily. ) 90 tablet 1  . metFORMIN (GLUCOPHAGE) 1000 MG tablet TAKE 1 TABLET BY MOUTH TWICE DAILY WITH MEALS (Patient taking differently: Take 1,000 mg by mouth 2 (two) times a day. ) 180 tablet 0  . omeprazole (PRILOSEC) 40 MG capsule Take 1 capsule (40 mg total) by mouth 2 (two) times daily before a meal. 90 capsule 2  . potassium chloride SA (K-DUR) 20 MEQ tablet Take 1 tablet (20 mEq total) by mouth 2 (two) times daily. 180 tablet 1  . pravastatin (PRAVACHOL) 40 MG tablet Take 1 tablet (40 mg total) by mouth daily. (Patient taking differently: Take 40 mg by mouth at bedtime. ) 90 tablet 3  . rivaroxaban (XARELTO) 20 MG TABS tablet Take 1 tablet (20 mg total) by mouth daily with supper. (Patient taking differently: Take 20 mg by mouth daily. ) 90 tablet 3  . sucralfate (CARAFATE) 1 GM/10ML suspension Take 10 mLs (1 g total) by mouth 4 (four) times daily. 420 mL 1  . Thiamine HCl (VITAMIN B1) 100 MG TABS Take 1 tablet by mouth once daily (Patient taking differently: Take 100 mg by mouth at bedtime. ) 90 tablet 0   No facility-administered medications prior to visit.     ROS Review of Systems  Constitutional: Negative for chills, fatigue and fever.  HENT: Negative.   Eyes: Negative for visual disturbance.  Respiratory: Negative for cough, shortness of breath and wheezing.   Cardiovascular: Negative for chest pain, palpitations and leg swelling.  Gastrointestinal: Negative for abdominal pain, diarrhea, nausea and vomiting.  Endocrine: Negative.  Negative for polydipsia, polyphagia and polyuria.  Genitourinary: Positive for hematuria. Negative for decreased urine volume, difficulty urinating, dysuria, flank pain, pelvic pain and urgency.  Musculoskeletal: Negative.  Negative for arthralgias and myalgias.  Skin: Negative.  Negative for color change.  Neurological: Negative.  Negative for dizziness and weakness.  Hematological: Negative for  adenopathy. Does not bruise/bleed easily.  Psychiatric/Behavioral: Negative.     Objective:  BP (!) 148/80 (BP Location: Left Arm, Patient Position: Sitting, Cuff Size: Normal)   Pulse (!) 56   Temp 97.6 F (36.4 C) (Oral)   Ht '5\' 5"'  (1.651 m)   Wt 176 lb (79.8 kg)   LMP  (LMP Unknown)   SpO2 97%   BMI 29.29 kg/m   BP Readings from Last 3 Encounters:  01/08/19 (!) 148/80  12/30/18 (!) 184/78  12/10/18 92/68    Wt Readings from Last 3 Encounters:  01/08/19 176 lb (79.8 kg)  12/30/18 175 lb 4.8 oz (79.5 kg)  12/10/18 173 lb 9.6 oz (78.7 kg)    Physical Exam Vitals signs reviewed.  Constitutional:      General: She is not in acute distress.    Appearance: She is not ill-appearing, toxic-appearing or diaphoretic.  HENT:     Nose: Nose normal.     Mouth/Throat:     Mouth: Mucous membranes are moist.  Eyes:     General: No scleral icterus.    Conjunctiva/sclera: Conjunctivae normal.  Neck:     Musculoskeletal: Normal range of motion and neck supple.  Cardiovascular:     Rate and Rhythm: Normal rate and regular rhythm.     Heart sounds: No murmur.  Pulmonary:     Effort: Pulmonary effort is normal. No respiratory distress.     Breath sounds: No stridor. No wheezing, rhonchi or rales.  Abdominal:     General: Abdomen is flat. Bowel sounds are normal. There is no distension.     Palpations: Abdomen is soft.     Tenderness: There is no right CVA tenderness or left CVA tenderness.  Musculoskeletal: Normal range of motion.     Right lower leg: No edema.     Left lower leg: No edema.  Skin:    General: Skin is dry.  Neurological:     General: No focal deficit present.     Mental Status: She is alert.  Psychiatric:        Mood and Affect: Mood normal.        Behavior: Behavior normal.     Lab Results  Component Value Date   WBC 5.4 10/08/2018   HGB 13.2 10/08/2018   HCT 39.7 10/08/2018   PLT 188.0 10/08/2018   GLUCOSE 145 (H) 01/08/2019   CHOL 109 10/08/2018    TRIG 158.0 (H) 10/08/2018   HDL 34.80 (L) 10/08/2018   LDLDIRECT 160.2 03/01/2010   LDLCALC 42 10/08/2018   ALT 20 10/08/2018   AST 18 10/08/2018   NA 138 01/08/2019   K 3.6 01/08/2019   CL 100 01/08/2019   CREATININE 0.76 01/08/2019   BUN 13 01/08/2019   CO2 30 01/08/2019   TSH 0.27 (L) 10/08/2018   INR 1.05 07/16/2014   HGBA1C 8.4 (H) 01/08/2019   MICROALBUR 6.3 (H) 03/05/2018    Vas [place] Abi With/wo Tbi  Result Date:  12/30/2018 LOWER EXTREMITY DOPPLER STUDY Indications: Peripheral artery disease. High Risk Factors: Hypertension, hyperlipidemia, diabetes.  Vascular Interventions: 07/16/14: Left second toe amputation; 06/08/14: left EIA                         and CFA angioplasty, left femoral to BK pop BPG with                         failure; 06/09/14: new left femoral to BK pop BPG, left                         ATA endarterectomy, thrombectomy left pop artery. Comparison Study: Slight increase in ABI's bilaterally Performing Technologist: Alvia Grove RVT  Examination Guidelines: A complete evaluation includes at minimum, Doppler waveform signals and systolic blood pressure reading at the level of bilateral brachial, anterior tibial, and posterior tibial arteries, when vessel segments are accessible. Bilateral testing is considered an integral part of a complete examination. Photoelectric Plethysmograph (PPG) waveforms and toe systolic pressure readings are included as required and additional duplex testing as needed. Limited examinations for reoccurring indications may be performed as noted.  ABI Findings: +---------+------------------+-----+----------+--------+ Right    Rt Pressure (mmHg)IndexWaveform  Comment  +---------+------------------+-----+----------+--------+ Brachial 204                                       +---------+------------------+-----+----------+--------+ PTA      125               0.61 monophasic          +---------+------------------+-----+----------+--------+ DP       82                0.40 monophasic         +---------+------------------+-----+----------+--------+ Great Toe83                0.41 Abnormal           +---------+------------------+-----+----------+--------+ +---------+------------------+-----+----------+-------+ Left     Lt Pressure (mmHg)IndexWaveform  Comment +---------+------------------+-----+----------+-------+ Brachial 198                                      +---------+------------------+-----+----------+-------+ PTA      141               0.69 monophasic        +---------+------------------+-----+----------+-------+ DP       137               0.67 monophasic        +---------+------------------+-----+----------+-------+ Great Toe92                0.45 Abnormal          +---------+------------------+-----+----------+-------+ +-------+-----------+-----------+------------+------------+ ABI/TBIToday's ABIToday's TBIPrevious ABIPrevious TBI +-------+-----------+-----------+------------+------------+ Right  0.61       0.41       0.54        0.23         +-------+-----------+-----------+------------+------------+ Left   0.69       0.45       0.53        0.32         +-------+-----------+-----------+------------+------------+  Summary: Right: Resting right ankle-brachial index indicates moderate right lower extremity arterial disease. The right toe-brachial  index is abnormal. Left: Resting left ankle-brachial index indicates moderate left lower extremity arterial disease. The left toe-brachial index is abnormal.  *See table(s) above for measurements and observations.  Electronically signed by Harold Barban MD on 12/30/2018 at 9:36:07 AM.    Final    Vas US Aorta/ivc/iliacs  Result Date: 12/30/2018 ABDOMINAL AORTA STUDY Indications: PAD Risk Factors: Hypertension, hyperlipidemia, diabetes. Vascular Interventions: 07/16/14: Left second toe  amputation; 06/08/14: left EIA                         and CFA angioplasty, left femoral to BK pop BPG with                         failure; 06/09/14: new left femoral to BK pop BPG, left                         ATA endarterectomy, thrombectomy left pop artery. Limitations: Air/bowel gas and obesity.  Performing Technologist: Alvia Grove RVT  Examination Guidelines: A complete evaluation includes B-mode imaging, spectral Doppler, color Doppler, and power Doppler as needed of all accessible portions of each vessel. Bilateral testing is considered an integral part of a complete examination. Limited examinations for reoccurring indications may be performed as noted.  Abdominal Aorta Findings: +-------------+-------+----------+----------+----------+--------+--------+ Location     AP (cm)Trans (cm)PSV (cm/s)Waveform  ThrombusComments +-------------+-------+----------+----------+----------+--------+--------+ Proximal                      69                                   +-------------+-------+----------+----------+----------+--------+--------+ Distal                        73        monophasic                 +-------------+-------+----------+----------+----------+--------+--------+ RT CIA Prox                   100       biphasic                   +-------------+-------+----------+----------+----------+--------+--------+ RT CIA Distal                 99        monophasic                 +-------------+-------+----------+----------+----------+--------+--------+ RT EIA Prox                   95        biphasic                   +-------------+-------+----------+----------+----------+--------+--------+ RT EIA Mid                    107       biphasic                   +-------------+-------+----------+----------+----------+--------+--------+ RT EIA Distal                 418       stenotic                    +-------------+-------+----------+----------+----------+--------+--------+ LT EIA Distal  109       biphasic                   +-------------+-------+----------+----------+----------+--------+--------+ Right CFA broad biphasic.  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 Suboptimal exam, unable to thoroughtly visuize the right iliac arteries.  *See table(s) above for measurements and observations.  Electronically signed by Harold Barban MD on 12/30/2018 at 20:36:37 AM.    Final     Assessment & Plan:   Theresa Harrington was seen today for hypertension, diabetes and urinary tract infection.  Diagnoses and all orders for this visit:  Need for influenza vaccination -     Flu Vaccine QUAD 36+ mos IM  Vitamin B12 deficiency neuropathy (HCC) -     cyanocobalamin ((VITAMIN B-12)) injection 1,000 mcg  Gross Hematuria- Her UA is positive for white cells and TNTC bacteria.  I will empirically treat her for UTI with a 7-day course of sulfamethoxazole and trimethoprim. -     Basic metabolic panel; Future -     Urinalysis, Routine w reflex microscopic; Future -     CULTURE, URINE COMPREHENSIVE; Future  Uncontrolled type 2 diabetes mellitus with peripheral artery disease ([place])- Her A1c is up to 8.4%.  I have asked her to improve her lifestyle modifications. -     Hemoglobin A1c; Future  Acute cystitis with hematuria -     sulfamethoxazole-trimethoprim (BACTRIM DS) 800-160 MG tablet; Take 1 tablet by mouth 2 (two) times daily for 7 days.   I am having Theresa Harrington start on sulfamethoxazole-trimethoprim. I am also having her maintain her Pen Needles, magnesium oxide, latanoprost, rivaroxaban, pravastatin, Vitamin B1, losartan-hydrochlorothiazide, ezetimibe, metFORMIN, Cartia XT, potassium chloride SA, OneTouch Verio IQ System, insulin detemir, glucose blood, sucralfate, AMBULATORY NON FORMULARY MEDICATION, omeprazole,  Trulicity, insulin aspart, and Basaglar KwikPen. We administered cyanocobalamin.  Meds ordered this encounter  Medications  . cyanocobalamin ((VITAMIN B-12)) injection 1,000 mcg  . sulfamethoxazole-trimethoprim (BACTRIM DS) 800-160 MG tablet    Sig: Take 1 tablet by mouth 2 (two) times daily for 7 days.    Dispense:  14 tablet    Refill:  0     Follow-up: Return in about 1 week (around 01/15/2019).  Theresa Calico, MD

## 2019-01-10 LAB — CULTURE, URINE COMPREHENSIVE
MICRO NUMBER:: 860926
SPECIMEN QUALITY:: ADEQUATE

## 2019-01-11 ENCOUNTER — Other Ambulatory Visit: Payer: Self-pay | Admitting: Internal Medicine

## 2019-01-11 DIAGNOSIS — N390 Urinary tract infection, site not specified: Secondary | ICD-10-CM | POA: Insufficient documentation

## 2019-01-11 DIAGNOSIS — Z1612 Extended spectrum beta lactamase (ESBL) resistance: Secondary | ICD-10-CM

## 2019-01-11 DIAGNOSIS — B9629 Other Escherichia coli [E. coli] as the cause of diseases classified elsewhere: Secondary | ICD-10-CM

## 2019-01-11 MED ORDER — NITROFURANTOIN MONOHYD MACRO 100 MG PO CAPS: 100.0000 mg | ORAL_CAPSULE | Freq: Two times a day (BID) | ORAL | 0 refills | Status: AC

## 2019-01-14 ENCOUNTER — Other Ambulatory Visit: Payer: Self-pay | Admitting: Pharmacy Technician

## 2019-01-14 NOTE — Patient Outreach (Signed)
[place] [facility]) Care Management  01/14/2019  Katora Faddis [birth year] [id number]   Received inbasket message from [place] inquiring about an update on the Trulicity refill with [employer] on behalf of [employer].  Care coordination phone call placed to [place] where I spoke to [place]. Natividad Brood informed they have a fax from 01/02/2019 stating that Dr. Scarlette Calico was not the patient's provider. Informed Natividad Brood that we faxed the script into [facility] on 01/01/2019 and it is signed by Dr. Scarlette Calico and nothing on that form states that she is not his patient. She informed she would transfer me to a pharmacist.  Damaris Schooner to [place] at [employer] who informed a new script for the Trulicity needs to be sent in or phoned in. She informed giving a verbal to a live person would the be quickest way to get the order processed. She provided me 2 numbers [phone] or [phone] which the latter is a direct line. She informed if the provider prefers to fax the script , then it could be faxed to [phone] or [phone].  Anderson Malta at [employer] also informed that it is almost time for the [employer] and [place] to ship and that prescriptions are needed for those medications as well.  Sent in basket message to [place] at Dr. Scarlette Calico' office.  Will followup with [facility] in 2-3 business days.  Harlea Goetzinger P. Staisha Winiarski, [place] Management [phone]

## 2019-01-16 ENCOUNTER — Other Ambulatory Visit: Payer: Self-pay | Admitting: Pharmacy Technician

## 2019-01-16 DIAGNOSIS — L97511 Non-pressure chronic ulcer of other part of right foot limited to breakdown of skin: Secondary | ICD-10-CM | POA: Diagnosis not present

## 2019-01-16 DIAGNOSIS — E1151 Type 2 diabetes mellitus with diabetic peripheral angiopathy without gangrene: Secondary | ICD-10-CM | POA: Diagnosis not present

## 2019-01-16 NOTE — Patient Outreach (Signed)
[facility]) Care Management  01/16/2019  Theresa Harrington 30-Jul-1955 [id number]    Care coordination call placed to [place] in regards to patient's application for Trulicity and Basaglar.  Spoke to [place] who informed the medications will be delivered on 01/24/2019.  Will followup with patient concerning this information.  Governor Mat[place] P. Seirra Kos, [place] Management [phone]

## 2019-01-16 NOTE — Patient Outreach (Signed)
[place] [facility]) Care Management  01/16/2019  Theresa Harrington 01-19-1956 [id number]   ADDENDUM  Successful outreach call placed to patient in regards to [place] application for Trulicity and [employer].  Spoke to patient, HIPAA identifiers verified.  Informed patient that I had spoken to [place] at [employer] on behalf of [place] informed the patient's above named medications would be delivered on 01/24/2019.   Patient verbalized understanding.  [name] P. Burney Calzadilla, [place] Management [phone]

## 2019-01-17 ENCOUNTER — Telehealth: Payer: Self-pay

## 2019-01-17 ENCOUNTER — Other Ambulatory Visit: Payer: Self-pay

## 2019-01-17 DIAGNOSIS — K22711 Barrett's esophagus with high grade dysplasia: Secondary | ICD-10-CM

## 2019-01-17 DIAGNOSIS — R1319 Other dysphagia: Secondary | ICD-10-CM

## 2019-01-17 DIAGNOSIS — K2100 Gastro-esophageal reflux disease with esophagitis, without bleeding: Secondary | ICD-10-CM

## 2019-01-17 DIAGNOSIS — R1310 Dysphagia, unspecified: Secondary | ICD-10-CM

## 2019-01-17 DIAGNOSIS — Z7901 Long term (current) use of anticoagulants: Secondary | ICD-10-CM

## 2019-01-18 ENCOUNTER — Other Ambulatory Visit: Payer: Self-pay | Admitting: Cardiology

## 2019-01-18 ENCOUNTER — Other Ambulatory Visit: Payer: Self-pay | Admitting: Internal Medicine

## 2019-01-18 DIAGNOSIS — E1151 Type 2 diabetes mellitus with diabetic peripheral angiopathy without gangrene: Secondary | ICD-10-CM

## 2019-01-20 NOTE — Addendum Note (Signed)
Addended byDebbe Mounts on: 01/20/2019 09:52 AM   Modules accepted: Orders, SmartSet

## 2019-01-20 NOTE — Telephone Encounter (Addendum)
Pt is scheduled for EGD with ablation on 02/12/19 @ [facility] 9:45am. Pt informed. Information mailed to pt.

## 2019-01-24 ENCOUNTER — Other Ambulatory Visit: Payer: Self-pay | Admitting: Pharmacy Technician

## 2019-01-24 NOTE — Patient Outreach (Signed)
[place] [facility]) Care Management  01/24/2019  Theresa Harrington 08-Jul-1955 [id number]  Care coordination call placed to [place] in regards to [place] application for [place].  Spoke to [place] who informed the Trulicity and Engineer, agricultural were delivered today at [place]. [name], [place] Management [phone]

## 2019-02-05 DIAGNOSIS — H401113 Primary open-angle glaucoma, right eye, severe stage: Secondary | ICD-10-CM | POA: Diagnosis not present

## 2019-02-05 DIAGNOSIS — H401121 Primary open-angle glaucoma, left eye, mild stage: Secondary | ICD-10-CM | POA: Diagnosis not present

## 2019-02-08 ENCOUNTER — Other Ambulatory Visit (HOSPITAL_COMMUNITY)
Admission: RE | Admit: 2019-02-08 | Discharge: 2019-02-08 | Disposition: A | Discharge status: Home / Self Care | Payer: PPO | Source: Ambulatory Visit | Attending: Gastroenterology | Admitting: Gastroenterology

## 2019-02-08 DIAGNOSIS — Z01812 Encounter for preprocedural laboratory examination: Secondary | ICD-10-CM | POA: Insufficient documentation

## 2019-02-08 DIAGNOSIS — Z20828 Contact with and (suspected) exposure to other viral communicable diseases: Secondary | ICD-10-CM | POA: Insufficient documentation

## 2019-02-09 LAB — NOVEL CORONAVIRUS, NAA (HOSP ORDER, SEND-OUT TO REF LAB; TAT 18-24 HRS): SARS-CoV-2, NAA: NOT DETECTED

## 2019-02-12 ENCOUNTER — Other Ambulatory Visit: Payer: Self-pay

## 2019-02-12 ENCOUNTER — Ambulatory Visit (HOSPITAL_COMMUNITY): Payer: PPO | Admitting: Certified Registered Nurse Anesthetist

## 2019-02-12 ENCOUNTER — Encounter (HOSPITAL_COMMUNITY)
Admission: RE | Disposition: A | Discharge status: Home / Self Care | Payer: Self-pay | Source: Home / Self Care | Attending: Gastroenterology

## 2019-02-12 ENCOUNTER — Encounter (HOSPITAL_COMMUNITY): Payer: Self-pay | Admitting: Gastroenterology

## 2019-02-12 ENCOUNTER — Ambulatory Visit (HOSPITAL_COMMUNITY)
Admission: RE | Admit: 2019-02-12 | Discharge: 2019-02-12 | Disposition: A | Discharge status: Home / Self Care | Payer: PPO | Attending: Gastroenterology | Admitting: Gastroenterology

## 2019-02-12 DIAGNOSIS — I480 Paroxysmal atrial fibrillation: Secondary | ICD-10-CM | POA: Diagnosis not present

## 2019-02-12 DIAGNOSIS — Z09 Encounter for follow-up examination after completed treatment for conditions other than malignant neoplasm: Secondary | ICD-10-CM | POA: Insufficient documentation

## 2019-02-12 DIAGNOSIS — E538 Deficiency of other specified B group vitamins: Secondary | ICD-10-CM | POA: Insufficient documentation

## 2019-02-12 DIAGNOSIS — Z881 Allergy status to other antibiotic agents status: Secondary | ICD-10-CM | POA: Insufficient documentation

## 2019-02-12 DIAGNOSIS — I10 Essential (primary) hypertension: Secondary | ICD-10-CM | POA: Insufficient documentation

## 2019-02-12 DIAGNOSIS — Z79899 Other long term (current) drug therapy: Secondary | ICD-10-CM | POA: Insufficient documentation

## 2019-02-12 DIAGNOSIS — Z880 Allergy status to penicillin: Secondary | ICD-10-CM | POA: Diagnosis not present

## 2019-02-12 DIAGNOSIS — Z888 Allergy status to other drugs, medicaments and biological substances status: Secondary | ICD-10-CM | POA: Insufficient documentation

## 2019-02-12 DIAGNOSIS — Z885 Allergy status to narcotic agent status: Secondary | ICD-10-CM | POA: Insufficient documentation

## 2019-02-12 DIAGNOSIS — K2100 Gastro-esophageal reflux disease with esophagitis, without bleeding: Secondary | ICD-10-CM | POA: Insufficient documentation

## 2019-02-12 DIAGNOSIS — K2270 Barrett's esophagus without dysplasia: Secondary | ICD-10-CM

## 2019-02-12 DIAGNOSIS — E1151 Type 2 diabetes mellitus with diabetic peripheral angiopathy without gangrene: Secondary | ICD-10-CM | POA: Insufficient documentation

## 2019-02-12 DIAGNOSIS — K449 Diaphragmatic hernia without obstruction or gangrene: Secondary | ICD-10-CM | POA: Insufficient documentation

## 2019-02-12 DIAGNOSIS — Z8719 Personal history of other diseases of the digestive system: Secondary | ICD-10-CM | POA: Insufficient documentation

## 2019-02-12 DIAGNOSIS — Z87891 Personal history of nicotine dependence: Secondary | ICD-10-CM | POA: Insufficient documentation

## 2019-02-12 DIAGNOSIS — D509 Iron deficiency anemia, unspecified: Secondary | ICD-10-CM | POA: Diagnosis not present

## 2019-02-12 DIAGNOSIS — E785 Hyperlipidemia, unspecified: Secondary | ICD-10-CM | POA: Diagnosis not present

## 2019-02-12 HISTORY — PX: ESOPHAGOGASTRODUODENOSCOPY (EGD) WITH PROPOFOL: SHX5813

## 2019-02-12 HISTORY — PX: GI RADIOFREQUENCY ABLATION: SHX6807

## 2019-02-12 LAB — GLUCOSE, CAPILLARY: Glucose-Capillary: 117 mg/dL — ABNORMAL HIGH (ref 70–99)

## 2019-02-12 SURGERY — ESOPHAGOGASTRODUODENOSCOPY (EGD) WITH PROPOFOL
Anesthesia: General

## 2019-02-12 MED ORDER — SUCRALFATE 1 GM/10ML PO SUSP: 1.0000 g | Freq: Four times a day (QID) | ORAL | 1 refills | Status: DC

## 2019-02-12 MED ORDER — AMBULATORY NON FORMULARY MEDICATION: 1 refills | Status: DC

## 2019-02-12 MED ORDER — HYDROCODONE-ACETAMINOPHEN 7.5-325 MG/15ML PO SOLN: 10.0000 mL | Freq: Four times a day (QID) | ORAL | 0 refills | Status: DC | PRN

## 2019-02-12 MED ORDER — LACTATED RINGERS IV SOLN
INTRAVENOUS | Status: DC
  Administered 2019-02-12: 1000 mL via INTRAVENOUS

## 2019-02-12 MED ORDER — DEXAMETHASONE SODIUM PHOSPHATE 10 MG/ML IJ SOLN
INTRAMUSCULAR | Status: DC | PRN
  Administered 2019-02-12: 10 mg via INTRAVENOUS

## 2019-02-12 MED ORDER — ACETYLCYSTEINE 20 % IN SOLN
RESPIRATORY_TRACT | Status: AC
  Filled 2019-02-12: qty 4

## 2019-02-12 MED ORDER — ROCURONIUM BROMIDE 10 MG/ML (PF) SYRINGE
PREFILLED_SYRINGE | INTRAVENOUS | Status: DC | PRN
  Administered 2019-02-12: 5 mg via INTRAVENOUS

## 2019-02-12 MED ORDER — SUCCINYLCHOLINE CHLORIDE 200 MG/10ML IV SOSY
PREFILLED_SYRINGE | INTRAVENOUS | Status: DC | PRN
  Administered 2019-02-12: 120 mg via INTRAVENOUS

## 2019-02-12 MED ORDER — ONDANSETRON HCL 4 MG/2ML IJ SOLN
INTRAMUSCULAR | Status: DC | PRN
  Administered 2019-02-12: 4 mg via INTRAVENOUS

## 2019-02-12 MED ORDER — PROPOFOL 10 MG/ML IV BOLUS
INTRAVENOUS | Status: DC | PRN
  Administered 2019-02-12: 180 ug/kg/min via INTRAVENOUS

## 2019-02-12 MED ORDER — STERILE WATER FOR INJECTION IJ SOLN
RESPIRATORY_TRACT | Status: DC | PRN
  Administered 2019-02-12: 80 mL via OROMUCOSAL

## 2019-02-12 SURGICAL SUPPLY — 15 items

## 2019-02-12 NOTE — Anesthesia Procedure Notes (Signed)
Procedure Name: Intubation Date/Time: 02/12/2019 9:49 AM Performed by: [place] Tamiah Dysart C, CRNA Pre-anesthesia Checklist: Patient identified, Emergency Drugs available, Suction available and Patient being monitored Patient Re-evaluated:Patient Re-evaluated prior to induction Oxygen Delivery Method: Circle system utilized Preoxygenation: Pre-oxygenation with 100% oxygen Induction Type: IV induction Ventilation: Mask ventilation without difficulty Laryngoscope Size: Mac and 3 Grade View: Grade II Tube type: Oral Tube size: 7.0 mm Number of attempts: 1 Airway Equipment and Method: Stylet and Oral airway Placement Confirmation: ETT inserted through vocal cords under direct vision,  positive ETCO2 and breath sounds checked- equal and bilateral Secured at: 21 cm Tube secured with: Tape Dental Injury: Teeth and Oropharynx as per pre-operative assessment

## 2019-02-12 NOTE — Discharge Instructions (Signed)
Procedure note for full details of medications. Xarelto restart no sooner than 72 hours (Saturday PM v Sunday AM).     YOU HAD AN ENDOSCOPIC PROCEDURE TODAY: Refer to the procedure report and other information in the discharge instructions given to you for any specific questions about what was found during the examination. If this information does not answer your questions, please call [place] office at [phone] to clarify.   YOU SHOULD EXPECT: Some feelings of bloating in the abdomen. Passage of more gas than usual. Walking can help get rid of the air that was put into your GI tract during the procedure and reduce the bloating. If you had a lower endoscopy (such as a colonoscopy or flexible sigmoidoscopy) you may notice spotting of blood in your stool or on the toilet paper. Some abdominal soreness may be present for a day or two, also.   ACTIVITY: Your care partner should take you home directly after the procedure. You should plan to take it easy, moving slowly for the rest of the day. You can resume normal activity the day after the procedure however YOU SHOULD NOT DRIVE, use power tools, machinery or perform tasks that involve climbing or major physical exertion for 24 hours (because of the sedation medicines used during the test).   SYMPTOMS TO REPORT IMMEDIATELY: A gastroenterologist can be reached at any hour. Please call [phone]  for any of the following symptoms:   Following upper endoscopy (EGD, EUS, ERCP, esophageal dilation) Vomiting of blood or coffee ground material  New, significant abdominal pain  New, significant chest pain or pain under the shoulder blades  Painful or persistently difficult swallowing  New shortness of breath  Black, tarry-looking or red, bloody stools  FOLLOW UP:  If any biopsies were taken you will be contacted by phone or by letter within the next 1-3 weeks. Call [phone]  if you have not heard about the biopsies in 3 weeks.  Please also call  with any specific questions about appointments or follow up tests.

## 2019-02-12 NOTE — Anesthesia Postprocedure Evaluation (Signed)
Anesthesia Post Note  Patient: Theresa Harrington  Procedure(s) Performed: ESOPHAGOGASTRODUODENOSCOPY (EGD) WITH PROPOFOL (N/A ) GI RADIOFREQUENCY ABLATION (N/A )     Patient location during evaluation: Phase II Anesthesia Type: General Level of consciousness: awake Pain management: pain level controlled Vital Signs Assessment: post-procedure vital signs reviewed and stable Respiratory status: spontaneous breathing Cardiovascular status: stable Postop Assessment: no apparent nausea or vomiting Anesthetic complications: no    Last Vitals:  Vitals:   02/12/19 1115 02/12/19 1120  BP: (!) 184/57   Pulse: (!) 52 (!) 53  Resp: 19 18  Temp:    SpO2: 96% 94%    Last Pain:  Vitals:   02/12/19 1050  TempSrc:   PainSc: 0-No pain   Pain Goal:                   Huston Foley

## 2019-02-12 NOTE — Op Note (Signed)
[facility] Patient Name: Theresa Harrington Procedure Date: 02/12/2019 MRN: [id number] Attending MD: Justice Britain , MD Date of Birth: 05/18/1955 CSN: [id number] Age: 63 Admit Type: Ambulatory Procedure:                Upper GI endoscopy Indications:              Follow-up of Barrett's esophagus, Follow-up of                            previous radiofrequency ablation treatment of                            Barrett's esophagus, Esophagitis, Follow-up of                            esophagitis Providers:                Justice Britain, MD, Burtis Junes, RN, Elspeth Cho Tech., Technician, Stephanie British [place] CRNA Referring MD:              Medicines:                General Anesthesia Complications:            No immediate complications. Estimated Blood Loss:     Estimated blood loss was minimal. Procedure:                Pre-Anesthesia Assessment:                           - Prior to the procedure, a History and Physical                            was performed, and patient medications and                            allergies were reviewed. The patient's tolerance of                            previous anesthesia was also reviewed. The risks                            and benefits of the procedure and the sedation                            options and risks were discussed with the patient.                            All questions were answered, and informed consent                            was obtained. Prior Anticoagulants: The patient has                            taken Xarelto (rivaroxaban), last  dose was 3 days                            prior to procedure. ASA Grade Assessment: III - A                            patient with severe systemic disease. After                            reviewing the risks and benefits, the patient was                            deemed in satisfactory condition to undergo the   procedure.                           After obtaining informed consent, the endoscope was                            passed under direct vision. Throughout the                            procedure, the patient's blood pressure, pulse, and                            oxygen saturations were monitored continuously. The                            GIF-H190 ([id number]) Olympus gastroscope was                            introduced through the mouth, and advanced to the                            second part of duodenum. The upper GI endoscopy was                            accomplished without difficulty. The patient                            tolerated the procedure. Scope In: Scope Out: Findings:      No gross lesions were noted in the proximal esophagus and in the mid       esophagus.      The esophagus and gastroesophageal junction were examined with white       light and narrow band imaging (NBI) from a forward view and retroflexed       position. There were esophageal mucosal changes consistent with       Barrett's esophagus. These changes involved the mucosa at the upper       extent of the gastric folds (38 cm from the incisors) extending to the       Z-line (37 cm from the incisors). Scattered islands of salmon-colored       mucosa were present from 34 to 37 cm. The maximum longitudinal extent of       these esophageal mucosal changes was  3 cm in length. Focal       radiofrequency ablation of Barrett's esophagus was performed. With the       endoscope in place, the position and extent of the Barrett's mucosa and       the anatomic landmarks including proximal and distal extent of Barrett's       mucosa were noted. Endoscopic visualization identified an ablation site       including the entire visible Barrett's segment. As on previous endoscopy       there was suggestion of esophagitis just proximal to Barrett's. I       decided, as it was not clear that this was not developing Barrett's  to       ablate this region as well. The mucosa was irrigated with       N-acetylcysteine (Mucomyst) 1% mixed with water. Esophageal contents       were suctioned. The endoscope was then removed from the patient. The       Barrx-90 radiofrequency ablation catheter was attached to the tip of the       endoscope. The endoscope with the attached radiofrequency ablation       catheter was then passed transorally under direct vision into the       esophagus and advanced to the areas of Barrett's mucosa. The areas       included islands of Barrett's mucosa. The radiofrequency ablation       catheter was placed in contact with the surface of the Barrett's mucosa       under direct visualization and energy was applied twice at 12 J/cm2.       Ablation was repeated in a likewise fashion to treat all visible       Barrett's mucosa. The ablation zone was cleaned of coagulative debris.       The ablation catheter and endoscope were then removed and the catheter       was cleaned. The catheter and endoscope were reinserted into the       esophagus. A second round of ablation was then performed. Energy was       applied once at 12 J/cm2 to retreat the areas of Barrett's epithelium       that had been treated with the first series of ablation. The areas of       the esophagus where Barrett's mucosa had been ablated were examined.       Areas of visible Barrett's esophagus were completely ablated. The total       number of energy applications for all mucosal sites treated was 52.      A 3 cm hiatal hernia was present.      No gross lesions were noted in the entire examined stomach.      No gross lesions were noted in the duodenal bulb, in the first portion       of the duodenum and in the second portion of the duodenum. Impression:               - No gross lesions in proximal/middle esophagus.                           - Esophageal mucosal changes consistent with                            Barrett's  esophagus and some just proximal  esophagitis v developing Barrett's was noted just                            proximal to the region. Treated with radiofrequency                            ablation to the entire region.                           - 3 cm hiatal hernia - a result of Lap-Band.                           - No gross mucosal lesions in the stomach.                           - No gross lesions in the duodenal bulb, in the                            first portion of the duodenum and in the second                            portion of the duodenum. Moderate Sedation:      Not Applicable - Patient had care per Anesthesia. Recommendation:           - The patient will be observed post-procedure,                            until all discharge criteria are met.                           - Discharge patient to home.                           - Patient has a contact number available for                            emergencies. The signs and symptoms of potential                            delayed complications were discussed with the                            patient. Return to normal activities tomorrow.                            Written discharge instructions were provided to the                            patient.                           - Clear liquid diet for 24 hours. Then, full liquid                              diet for 72 hours. Then, soft diet for 1 week                            thereafter. Then advance your diet thereafter to                            regular.                           - Continue your PPI 40 mg twice daily for the next                            3 months.                           - You will be prescribed a lidocaine/antiacid                            mixture that you can take up to 4 times per day as                            needed (prescription has been sent to your                            pharmacy) - take no matter what  for at least first                            3-days.                           - You will be prescribed liquid Tylenol with                            codeine that you can use up to 4 times daily for                            the next 72 hours if needed (prescription to be                            sent to your pharmacy) - I recommend using this                            even if you do not have pain for the first 2-3 days.                           - You will be prescribed Sucralfate Suspension and                            you will take this 4 times daily (make sure you are                            not taking any other medication 1 hour before   or 1                            hour after this medication is taken) to allow                            coating and healing of your esophagus - this needs                            to be taken for at least 1-month (you should have                            refills available but call if you run out).                           - May restart Xarelto in 72 hours in order to                            decrease risk of post-RFA bleeding.                           - Minimize use of NSAIDs for next 2-4 weeks.                           - Repeat EGD in 3 months for retreatment (liquid                            diet for 24 hour prior to procedure).                           - Observe patient's clinical course.                           - The findings and recommendations were discussed                            with the patient. Procedure Code(s):        --- Professional ---                           43270, Esophagogastroduodenoscopy, flexible,                            transoral; with ablation of tumor(s), polyp(s), or                            other lesion(s) (includes pre- and post-dilation                            and guide wire passage, when performed) Diagnosis Code(s):        --- Professional ---                           K22.70,  Barrett's esophagus without dysplasia                             K44.9, Diaphragmatic hernia without obstruction or                            gangrene                           Z09, Encounter for follow-up examination after                            completed treatment for conditions other than                            malignant neoplasm                           K20.9, Esophagitis, unspecified CPT copyright 2019 American Medical Association. All rights reserved. The codes documented in this report are preliminary and upon coder review may  be revised to meet current compliance requirements. Justice Britain, MD 02/12/2019 10:43:15 AM Number of Addenda: 0

## 2019-02-12 NOTE — Transfer of Care (Signed)
Immediate Anesthesia Transfer of Care Note  Patient: Theresa Harrington  Procedure(s) Performed: ESOPHAGOGASTRODUODENOSCOPY (EGD) WITH PROPOFOL (N/A ) GI RADIOFREQUENCY ABLATION (N/A )  Patient Location: PACU and Endoscopy Unit  Anesthesia Type:General  Level of Consciousness: awake, alert  and oriented  Airway & Oxygen Therapy: Patient Spontanous Breathing and Patient connected to face mask oxygen  Post-op Assessment: Report given to RN and Post -op Vital signs reviewed and stable  Post vital signs: Reviewed and stable  Last Vitals:  Vitals Value Taken Time  BP    Temp    Pulse    Resp    SpO2      Last Pain:  Vitals:   02/12/19 0841  PainSc: 0-No pain         Complications: No apparent anesthesia complications

## 2019-02-12 NOTE — H&P (Signed)
GASTROENTEROLOGY PROCEDURE H&P NOTE   Primary Care Physician: [name], MD  HPI: Velena Mcveigh is a 63 y.o. female who presents for EGD with RFA.  Past Medical History:  Diagnosis Date  . Allergy   . Anxiety    Pt. denies  . Asymptomatic cholelithiasis   . Atherosclerosis of aorta ([place])   . Cataract   . Clotting disorder ([place])   . Gallstones 06/2013  . GERD (gastroesophageal reflux disease)   . History of kidney stones    lithotrispy  . Hyperlipidemia   . Hypertension   . Hypothyroidism    no meds  . Iron deficiency anemia   . PAD (peripheral artery disease) ([place])   . PAF (paroxysmal atrial fibrillation) ([place])   . Peripheral arterial occlusive disease (HCC)    lower extremities  . Pneumonia   . PONV (postoperative nausea and vomiting)   . Right ureteral stone   . Type 2 diabetes mellitus (H[facility])    Type  . Vitamin B 12 deficiency   . Wears glasses    Past Surgical History:  Procedure Laterality Date  . AMPUTATION Left 07/16/2014   Procedure: LEFT SECOND TOE AMPUTATION;  Surgeon: Serafina Mitchell, MD;  Location: [place];  Service: Vascular;  Laterality: Left;  With Nerve block  . AUGMENTATION MAMMAPLASTY    . BELPHAROPTOSIS REPAIR     eyelid lift  . BIOPSY  10/14/2018   Procedure: BIOPSY;  Surgeon: Rush Landmark Telford Nab., MD;  Location: [place];  Service: Gastroenterology;;  . COLONOSCOPY    . COMBINED AUGMENTATION MAMMAPLASTY AND ABDOMINOPLASTY  2009   W/  BILATERAL  THIGH LIFT  . CYSTO/  RIGHT URETERAL STENT PLACEMENT  12-30-2010  . CYSTOSCOPY WITH RETROGRADE PYELOGRAM, URETEROSCOPY AND STENT PLACEMENT Right 10/07/2013   Procedure: CYSTOSCOPY WITH RETROGRADE PYELOGRAM, right URETEROSCOPY AND STENT PLACEMENT, stone extraction;  Surgeon: Arvil Persons, MD;  Location: [facility];  Service: Urology;  Laterality: Right;  . CYSTOSCOPY WITH STENT PLACEMENT Right 06/04/2013   Procedure: CYSTOSCOPY WITH STENT PLACEMENT;  Surgeon: Franchot Gallo, MD;   Location: [facility];  Service: Urology;  Laterality: Right;  . DILATATION & CURETTAGE/HYSTEROSCOPY WITH MYOSURE N/A 04/27/2015   Procedure: DILATATION & CURETTAGE/HYSTEROSCOPY WITH MYOSURE;  Surgeon: Terrance Mass, MD;  Location: [facility];  Service: Gynecology;  Laterality: N/A;  . ENDARTERECTOMY FEMORAL Left 06/08/2014   Procedure: Left Leg Common Femoral and External Iliac  Endartarectomy with patch Angioplasty;  Surgeon: Rosetta Posner, MD;  Location[facility];  Service: Vascular;  Laterality: Left;  . ESOPHAGOGASTRODUODENOSCOPY N/A 10/14/2018   Procedure: ESOPHAGOGASTRODUODENOSCOPY (EGD);  Surgeon: Irving Copas., MD;  Location: [place];  Service: Gastroenterology;  Laterality: N/A;  . ESOPHAGOGASTRODUODENOSCOPY (EGD) WITH PROPOFOL N/A 11/06/2018   Procedure: ESOPHAGOGASTRODUODENOSCOPY (EGD) WITH PROPOFOL;  Surgeon: Rush Landmark Telford Nab., MD;  Location: [facility];  Service: Gastroenterology;  Laterality: N/A;  RFA  . EXTRACORPOREAL SHOCK WAVE LITHOTRIPSY Right 08-04-2013//   06-23-2013//   01-16-2011  . EYE SURGERY Bilateral    cataract  . FEMORAL-POPLITEAL BYPASS GRAFT Left 06/08/2014   Procedure: Left Leg Femoral -Popliteal Bypass Graft;  Surgeon: Rosetta Posner, MD;  Location: [place];  Service: Vascular;  Laterality: Left;  . FEMORAL-POPLITEAL BYPASS GRAFT Left 06/09/2014   Procedure: Left Femoral and Popliteal Exposure; Left Femoral to Anterior Tibial Bypass Graft using Propaten 30mm by 80cm Goretex Graft; Left Tibial Endarterectomy; Left Femoraland Popliteal Thrombectomy ;  Surgeon: Serafina Mitchell, MD;  Location: [place];  Service: Vascular;  Laterality: Left;  . GI RADIOFREQUENCY ABLATION N/A 11/06/2018   Procedure: GI RADIOFREQUENCY ABLATION;  Surgeon: Rush Landmark Telford Nab., MD;  Location: [facility];  Service: Gastroenterology;  Laterality: N/A;  . HOLMIUM LASER APPLICATION Right [id number]   Procedure: HOLMIUM LASER APPLICATION;  Surgeon: Arvil Persons, MD;  Location: [facility];  Service: Urology;  Laterality: Right;  . KIDNEY STONE SURGERY  April 2015   1-2 stones  . LAPAROSCOPIC GASTRIC BANDING  05-29-2005  . LITHOTRIPSY  2-3 times  . LOWER EXTREMITY ANGIOGRAM N/A 06/04/2014   Procedure: LOWER EXTREMITY ANGIOGRAM;  Surgeon: Serafina Mitchell, MD;  Location: [facility];  Service: Cardiovascular;  Laterality: N/A;  . ORIF FIFTH METACARPAL [place]  RIGHT HAND  04-21-2002  . REVISION AND RE-SITING LAP-BAND PORT  04-08-2010   W/  UPPER EGD  . RIGHT KNEE PATELLECTOMY W/ REPAIR OF EXTENSOR MECHANISM  04-14-2002  . Toenail removed Left Jan. 21, 2016   2nd toenail-  Dr. Barkley Bruns   No current facility-administered medications for this encounter.    Allergies  Allergen Reactions  . Amlodipine Swelling  . Atorvastatin Other (See Comments)    Muscle aches   . Ampicillin Nausea And Vomiting and Other (See Comments)  . Codeine Nausea And Vomiting    Ask patient  . Lisinopril Cough       . Penicillins Nausea And Vomiting    Did it involve swelling of the face/tongue/throat, SOB, or low BP? No Did it involve sudden or severe rash/hives, skin peeling, or any reaction on the inside of your mouth or nose? No Did you need to seek medical attention at a hospital or doctor's office? No When did it last happen?20+ years If all above answers are "NO", may proceed with cephalosporin use.   [name] [Sitagliptin] Nausea And Vomiting   Family History  Problem Relation Age of Onset  . Diabetes Father   . Heart disease Father   . Deep vein thrombosis Father   . Hyperlipidemia Father   . Diabetes Sister   . Heart disease Sister   . Deep vein thrombosis Sister   . Hyperlipidemia Sister   . Diabetes Brother   . Heart disease Brother   . Cancer Brother   . Hyperlipidemia Brother   . Colon cancer Maternal Aunt   . Diabetes Brother   . Diabetes Brother   . Kidney disease Mother   . Hyperlipidemia Mother   . Other Mother        AAA   and    Amputation  . Arthritis  Other   . Cancer Other        colon  . Hypertension Other   . Stroke Other   . Esophageal cancer Neg Hx   . Stomach cancer Neg Hx   . Inflammatory bowel disease Neg Hx   . Liver disease Neg Hx   . Pancreatic cancer Neg Hx    Social History   Socioeconomic History  . Marital status: Single    Spouse name: Not on file  . Number of children: Not on file  . Years of education: Not on file  . Highest education level: Not on file  Occupational History  . Occupation: Quarry manager: [employer]  . Financial resource strain: Not on file  . Food insecurity    Worry: Not on file    Inability: Not on file  . Transportation needs  Medical: Not on file    Non-medical: Not on file  Tobacco Use  . Smoking status: Former Smoker    Packs/day: 1.00    Years: 28.00    Pack years: 28.00    Types: Cigarettes    Quit date: 09/05/2001    Years since quitting: 17.4  . Smokeless tobacco: Never Used  Substance and Sexual Activity  . Alcohol use: No    Alcohol/week: 0.0 standard drinks  . Drug use: No  . Sexual activity: Yes  Lifestyle  . Physical activity    Days per week: Not on file    Minutes per session: Not on file  . Stress: Not on file  Relationships  . Social Herbalist on phone: Not on file    Gets together: Not on file    Attends religious service: Not on file    Active member of club or organization: Not on file    Attends meetings of clubs or organizations: Not on file    Relationship status: Not on file  . Intimate partner violence    Fear of current or ex partner: Not on file    Emotionally abused: Not on file    Physically abused: Not on file    Forced sexual activity: Not on file  Other Topics Concern  . Not on file  Social History Narrative   Regular exercise- yes    Physical Exam: Vital signs in last 24 hours:     GEN: NAD EYE: Sclerae anicteric ENT: MMM CV: Non-tachycardic GI: Soft, NT/ND NEURO:  Alert & Oriented  x 3  Lab Results: No results for input(s): WBC, HGB, HCT, PLT in the last 72 hours. BMET No results for input(s): NA, K, CL, CO2, GLUCOSE, BUN, CREATININE, CALCIUM in the last 72 hours. LFT No results for input(s): PROT, ALBUMIN, AST, ALT, ALKPHOS, BILITOT, BILIDIR, IBILI in the last 72 hours. PT/INR No results for input(s): LABPROT, INR in the last 72 hours.   Impression / Plan: This is a 63 y.o.female who presents for EGD with RFA.  The risks and benefits of endoscopic evaluation were discussed with the patient; these include but are not limited to the risk of perforation, infection, bleeding, missed lesions, lack of diagnosis, severe illness requiring hospitalization, as well as anesthesia and sedation related illnesses.  The patient is agreeable to proceed.    Justice Britain, MD [facility] # [id number]

## 2019-02-12 NOTE — Anesthesia Preprocedure Evaluation (Signed)
Anesthesia Evaluation  Patient identified by MRN, date of birth, ID band Patient awake    Reviewed: Allergy & Precautions, NPO status , Patient's Chart, lab work & pertinent test results  History of Anesthesia Complications (+) PONV, DIFFICULT AIRWAY and history of anesthetic complications  Airway Mallampati: II  TM Distance: >3 FB Neck ROM: Full    Dental no notable dental hx. (+) Teeth Intact   Pulmonary former smoker,    Pulmonary exam normal breath sounds clear to auscultation       Cardiovascular hypertension, + Peripheral Vascular Disease  Normal cardiovascular exam(-) dysrhythmias  Rhythm:Regular Rate:Normal     Neuro/Psych Anxiety    GI/Hepatic Neg liver ROS, GERD  ,  Endo/Other  diabetesHypothyroidism   Renal/GU   negative genitourinary   Musculoskeletal negative musculoskeletal ROS (+)   Abdominal Normal abdominal exam  (+)   Peds  Hematology  (+) Blood dyscrasia, anemia ,   Anesthesia Other Findings   Reproductive/Obstetrics                             Lab Results  Component Value Date   CREATININE 0.76 01/08/2019   BUN 13 01/08/2019   NA 138 01/08/2019   K 3.6 01/08/2019   CL 100 01/08/2019   CO2 30 01/08/2019   Lab Results  Component Value Date   WBC 5.4 10/08/2018   HGB 13.2 10/08/2018   HCT 39.7 10/08/2018   MCV 89.5 10/08/2018   PLT 188.0 10/08/2018     Anesthesia Physical  Anesthesia Plan  ASA: III  Anesthesia Plan: General   Post-op Pain Management:    Induction: Intravenous  PONV Risk Score and Plan: 3 and Treatment may vary due to age or medical condition, Dexamethasone and Ondansetron  Airway Management Planned: Oral ETT  Additional Equipment: None  Intra-op Plan:   Post-operative Plan: Extubation in OR  Informed Consent: I have reviewed the patients History and Physical, chart, labs and discussed the procedure including the risks,  benefits and alternatives for the proposed anesthesia with the patient or authorized representative who has indicated his/her understanding and acceptance.     Dental advisory given  Plan Discussed with: CRNA  Anesthesia Plan Comments:         Anesthesia Quick Evaluationlastrenal

## 2019-02-13 ENCOUNTER — Encounter (HOSPITAL_COMMUNITY): Payer: Self-pay | Admitting: Gastroenterology

## 2019-02-14 ENCOUNTER — Telehealth: Payer: Self-pay | Admitting: Gastroenterology

## 2019-02-14 ENCOUNTER — Other Ambulatory Visit: Payer: Self-pay

## 2019-02-14 MED ORDER — AMBULATORY NON FORMULARY MEDICATION: 1 refills | Status: DC

## 2019-02-14 MED ORDER — HYDROCODONE-ACETAMINOPHEN 7.5-325 MG/15ML PO SOLN: 10.0000 mL | Freq: Four times a day (QID) | ORAL | 0 refills | Status: DC | PRN

## 2019-02-14 NOTE — Addendum Note (Signed)
Addended by: Larina Bras on: 02/14/2019 12:58 PM   Modules accepted: Orders

## 2019-02-14 NOTE — Telephone Encounter (Signed)
Yes I sent them the day of her procedure.  I faxed the narcotic and the GI cocktail.  I will call the pharmacy.

## 2019-02-14 NOTE — Addendum Note (Signed)
Addended by: Lucio Edward T on: 02/14/2019 01:03 PM   Modules accepted: Orders

## 2019-02-14 NOTE — Telephone Encounter (Signed)
Thank you :)

## 2019-02-14 NOTE — Telephone Encounter (Signed)
Dr Fuller Plan you are DOD this morning and I have a Dr Rush Landmark pt who was prescribed hydrocodone (see below) The pharmacy has called and will not accept the faxed prescription.  Can you please send to her [place]?   HYDROcodone-acetaminophen (HYCET) 7.5-325 mg/15 ml solution [id number]   Order Details Dose: 10 mL Route: Oral Frequency: 4 times daily PRN for moderate pain  Dispense Quantity: 120 mL Refills: 0 Fills remaining: --        Sig: Take 10 mLs by mouth 4 (four) times daily as needed for moderate pain.       Written Date: 02/12/19 Expiration Date: 08/11/19    Start Date: 02/12/19 End Date: --    Earliest Fill Date: 02/12/19          Ordering Provider: Irving Copas., MD DEA #:  [id number] NPI:  [id number]   Authorizing Provider: Irving Copas., MD DEA #:  [id number] NPI:  [id number]   Ordering User:  Timothy Lasso, RN            Original Order:  HYDROcodone-acetaminophen (HYCET) 7.5-325 mg/15 ml solution [id number]    Pharmacy:  [facility] [id number] [place] #:  [id number]  Pharmacy Comments: --

## 2019-02-14 NOTE — Telephone Encounter (Signed)
Patty do you know anything about medicines that were to be sent?

## 2019-02-17 ENCOUNTER — Telehealth: Payer: Self-pay | Admitting: Gastroenterology

## 2019-02-17 ENCOUNTER — Ambulatory Visit: Payer: Self-pay | Admitting: Internal Medicine

## 2019-02-17 ENCOUNTER — Encounter (INDEPENDENT_AMBULATORY_CARE_PROVIDER_SITE_OTHER): Payer: Self-pay | Admitting: Ophthalmology

## 2019-02-17 MED ORDER — HYDROCODONE-ACETAMINOPHEN 7.5-325 MG/15ML PO SOLN: 15.0000 mL | Freq: Four times a day (QID) | ORAL | 0 refills | Status: DC | PRN

## 2019-02-17 NOTE — Telephone Encounter (Signed)
BP of 167/60-something. Patient states she also has a slight headache; 123/60s pm; she normally runs 130s/80s ; the pt says that she has a nagging headache rated 1 out of 10; she denies visual changes, chest pain, or SOB; she had an EGD on 02/12/2019 and her SBP was in the 180's; recommendations made per nurse triage protocol; pt also instructed to write down her BP measurements, and her cuff to the appt; she verbalized understanding; she sees Dr Scarlette Calico, [facility]; pt transferred to [facility] for scheduling.    Reason for Disposition . Systolic BP  >= 0000000 OR Diastolic >= [id number]  Answer Assessment - Initial Assessment Questions 1. BLOOD PRESSURE: "What is the blood pressure?" "Did you take at least two measurements 5 minutes apart?"     168/60s and 167/67   2. ONSET: "When did you take your blood pressure?"     02/17/2019 at 0730 3. HOW: "How did you obtain the blood pressure?" (e.g., visiting nurse, automatic home BP monitor)   Home cuff left upper arm 4. HISTORY: "Do you have a history of high blood pressure?"    yes 5. MEDICATIONS: "Are you taking any medications for blood pressure?" "Have you missed any doses recently?"     Yes, no missed doses 6. OTHER SYMPTOMS: "Do you have any symptoms?" (e.g., headache, chest pain, blurred vision, difficulty breathing, weakness)     Nagging headache 7. PREGNANCY: "Is there any chance you are pregnant?" "When was your last menstrual period?"     no  Protocols used: HIGH BLOOD PRESSURE-A-AH

## 2019-02-17 NOTE — Telephone Encounter (Signed)
Prescription sent to pharmacy.

## 2019-02-17 NOTE — Telephone Encounter (Signed)
-----   Message from Timothy Lasso, RN sent at 02/17/2019  8:11 AM EDT ----- Dr Rush Landmark we have tried unsuccessfully to send this pt's prescription.  Dr Fuller Plan tried and was unable as well.  Can you please send?    HYDROcodone-acetaminophen (HYCET) 7.5-325 mg/15 ml solution [id number]    Dose: 10 mL Route: Oral Frequency: 4 times daily PRN for moderate pain Dispense Quantity: 120 mL Refills: 0 Fills remaining: --      Sig: Take 10 mLs by mouth 4 (four) times daily as needed for moderate pain.     Written Date: 02/12/19 Expiration Date: 08/11/19   Start Date: 02/12/19 End Date: --   Earliest Fill Date: 02/12/19        Ordering Provider: Irving Copas., MD DEA #:  [id number] NPI:  [id number]  Authorizing Provider: Irving Copas., MD DEA #:  [id number] NPI:  [id number]  Ordering User:  Timothy Lasso, RN         Original Order:  HYDROcodone-acetaminophen (HYCET) 7.5-325 mg/15 ml solution [id number]  Pharmacy:  [facility] [address] [place] #:  [id number]  ----- Message ----- From: Ladene Artist, MD Sent: 02/14/2019   4:08 PM EDT To: Timothy Lasso, RN  My imprivata is not functioning to allow this prescription to be sent under my name. We have tried to troubleshoot but no success.

## 2019-02-26 ENCOUNTER — Other Ambulatory Visit: Payer: Self-pay

## 2019-02-26 ENCOUNTER — Ambulatory Visit (INDEPENDENT_AMBULATORY_CARE_PROVIDER_SITE_OTHER): Payer: PPO | Admitting: Internal Medicine

## 2019-02-26 ENCOUNTER — Other Ambulatory Visit (INDEPENDENT_AMBULATORY_CARE_PROVIDER_SITE_OTHER): Payer: PPO

## 2019-02-26 ENCOUNTER — Encounter: Payer: Self-pay | Admitting: Internal Medicine

## 2019-02-26 VITALS — BP 190/80 | HR 60 | Temp 97.8°F | Resp 16 | Ht 65.0 in | Wt 175.0 lb

## 2019-02-26 DIAGNOSIS — N390 Urinary tract infection, site not specified: Secondary | ICD-10-CM | POA: Diagnosis not present

## 2019-02-26 DIAGNOSIS — B9629 Other Escherichia coli [E. coli] as the cause of diseases classified elsewhere: Secondary | ICD-10-CM

## 2019-02-26 DIAGNOSIS — E538 Deficiency of other specified B group vitamins: Secondary | ICD-10-CM | POA: Diagnosis not present

## 2019-02-26 DIAGNOSIS — E1165 Type 2 diabetes mellitus with hyperglycemia: Secondary | ICD-10-CM

## 2019-02-26 DIAGNOSIS — Z1612 Extended spectrum beta lactamase (ESBL) resistance: Secondary | ICD-10-CM

## 2019-02-26 DIAGNOSIS — I10 Essential (primary) hypertension: Secondary | ICD-10-CM

## 2019-02-26 DIAGNOSIS — E1151 Type 2 diabetes mellitus with diabetic peripheral angiopathy without gangrene: Secondary | ICD-10-CM

## 2019-02-26 DIAGNOSIS — IMO0002 Reserved for concepts with insufficient information to code with codable children: Secondary | ICD-10-CM

## 2019-02-26 LAB — URINALYSIS, ROUTINE W REFLEX MICROSCOPIC
Nitrite: NEGATIVE
Specific Gravity, Urine: 1.025 (ref 1.000–1.030)
Total Protein, Urine: 100 — AB
Urine Glucose: NEGATIVE
Urobilinogen, UA: 1 (ref 0.0–1.0)
pH: 7 (ref 5.0–8.0)

## 2019-02-26 LAB — MICROALBUMIN / CREATININE URINE RATIO
Creatinine,U: 226.8 mg/dL
Microalb Creat Ratio: 18.6 mg/g (ref 0.0–30.0)
Microalb, Ur: 42.3 mg/dL — ABNORMAL HIGH (ref 0.0–1.9)

## 2019-02-26 MED ORDER — IRBESARTAN 150 MG PO TABS: 150.0000 mg | ORAL_TABLET | Freq: Every day | ORAL | 0 refills | Status: DC

## 2019-02-26 MED ORDER — CYANOCOBALAMIN 1000 MCG/ML IJ SOLN
1000.0000 ug | Freq: Once | INTRAMUSCULAR | Status: AC
  Administered 2019-02-26: 1000 ug via INTRAMUSCULAR

## 2019-02-26 MED ORDER — INDAPAMIDE 2.5 MG PO TABS: 2.5000 mg | ORAL_TABLET | Freq: Every day | ORAL | 0 refills | Status: DC

## 2019-02-26 MED ORDER — BASAGLAR KWIKPEN 100 UNIT/ML ~~LOC~~ SOPN: 60.0000 [IU] | PEN_INJECTOR | Freq: Every day | SUBCUTANEOUS | 1 refills | Status: DC

## 2019-02-26 NOTE — Patient Instructions (Signed)

## 2019-02-26 NOTE — Progress Notes (Signed)
Subjective:  Patient ID: Theresa Harrington, female    DOB: 1955/10/26  Age: 63 y.o. MRN: [id number]  CC: Hypertension, Diabetes, and Urinary Tract Infection   HPI Gerarda Funderburke presents for f/up - She complains that her blood pressure has not recently been well controlled however she tells me she has been compliant with the CCB, ARB, and thiazide diuretic.  She has had a few headaches recently but denies chest pain or shortness of breath.  She complains that she has had some low blood sugars at night down into the 50s and 60s and wants to decrease her dose of basal insulin from 80 units a day to 60 units a day.  Outpatient Medications Prior to Visit  Medication Sig Dispense Refill  . acetaminophen (TYLENOL) 650 MG CR tablet Take 1,300 mg by mouth every 8 (eight) hours as needed for pain.    [name] AMBULATORY NON FORMULARY MEDICATION 84m-Viscous lidocaine, 957m10mg/5ml Diyclomine, 27058malox Swish and Swallow 28m63m mouth four times daily. 550 mL 1  . Blood Glucose Monitoring Suppl (ONETOUCH VERIO IQ SYSTEM) w/Device KIT USE TO CHECK SUGAR DAILY 1 kit 0  . CARTIA XT 120 MG 24 hr capsule Take 1 capsule by mouth once daily (Patient taking differently: Take 120 mg by mouth daily. ) 90 capsule 3  . dorzolamide (TRUSOPT) 2 % ophthalmic solution Place 1 drop into the right eye 2 (two) times daily.    . Dulaglutide (TRULICITY) 0.753.660YQ/0.3KVN Inject 0.75 mg as directed every Monday. (Patient taking differently: Inject 0.75 mg as directed every Wednesday. ) 12 pen 1  . ezetimibe (ZETIA) 10 MG tablet Take 10 mg by mouth at bedtime.     . glMarland Kitchencose blood test strip Use to check blood sugar twice daily. DX: E11.8 200 each 3  . HYDROcodone-acetaminophen (HYCET) 7.5-325 mg/15 ml solution Take 15 mLs by mouth 4 (four) times daily as needed for moderate pain. 120 mL 0  . insulin aspart (NOVOLOG) 100 UNIT/ML injection Inject 14-20 Units into the skin 3 (three) times daily before meals. (Patient taking differently:  Inject 14 Units into the skin 2 (two) times daily with a meal. ) 10 mL 2  . Insulin Pen Needle (PEN NEEDLES) 32G X 4 MM MISC Inject 1 pen as directed 4 (four) times daily. Use to inject levemir or novolog as directed. 400 each 3  . latanoprost (XALATAN) 0.005 % ophthalmic solution Place 1 drop into both eyes at bedtime.     . magnesium oxide (MAG-OX) 400 (241.3 Mg) MG tablet Take 1 tablet (400 mg total) by mouth daily. 90 tablet 1  . metFORMIN (GLUCOPHAGE) 1000 MG tablet TAKE 1 TABLET BY MOUTH TWICE DAILY WITH MEALS (Patient taking differently: Take 1,000 mg by mouth 2 (two) times daily with a meal. ) 180 tablet 0  . omeprazole (PRILOSEC) 40 MG capsule Take 1 capsule (40 mg total) by mouth 2 (two) times daily before a meal. 90 capsule 2  . potassium chloride SA (K-DUR) 20 MEQ tablet Take 1 tablet (20 mEq total) by mouth 2 (two) times daily. (Patient taking differently: Take 20 mEq by mouth daily. ) 180 tablet 1  . pravastatin (PRAVACHOL) 40 MG tablet Take 1 tablet (40 mg total) by mouth daily. (Patient taking differently: Take 40 mg by mouth at bedtime. ) 90 tablet 3  . rivaroxaban (XARELTO) 20 MG TABS tablet Take 1 tablet (20 mg total) by mouth daily. 90 tablet 1  . sucralfate (CARAFATE) 1 GM/10ML suspension Take 10  mLs (1 g total) by mouth 4 (four) times daily. 420 mL 1  . insulin detemir (LEVEMIR) 100 UNIT/ML injection Inject 80 Units into the skin at bedtime.     . Insulin Glargine (BASAGLAR KWIKPEN) 100 UNIT/ML SOPN Inject 0.8 mLs (80 Units total) into the skin daily. 15 mL 1  . losartan-hydrochlorothiazide (HYZAAR) 100-12.5 MG tablet Take 1 tablet by mouth once daily (Patient taking differently: Take 1 tablet by mouth daily. ) 90 tablet 0   No facility-administered medications prior to visit.     ROS Review of Systems  Constitutional: Negative for chills, diaphoresis, fatigue and fever.  HENT: Negative.   Eyes: Negative.   Respiratory: Negative.  Negative for cough, chest tightness,  shortness of breath and wheezing.   Cardiovascular: Negative for chest pain, palpitations and leg swelling.  Gastrointestinal: Negative for abdominal pain, constipation, diarrhea, nausea and vomiting.  Genitourinary: Negative for decreased urine volume, difficulty urinating, dysuria, flank pain, frequency, hematuria and urgency.       Complains that her urine is cloudy but has no other urinary symptoms  Musculoskeletal: Negative.  Negative for arthralgias and myalgias.  Skin: Negative.  Negative for color change and pallor.  Neurological: Positive for headaches. Negative for dizziness, weakness and light-headedness.  Hematological: Negative for adenopathy. Does not bruise/bleed easily.  Psychiatric/Behavioral: Negative.     Objective:  BP (!) 190/80 (BP Location: Left Arm, Patient Position: Sitting, Cuff Size: Normal)   Pulse 60   Temp 97.8 F (36.6 C) (Oral)   Resp 16   Ht _0  (1.651 m)   Wt 175 lb (79.4 kg)   LMP  (LMP Unknown)   SpO2 98%   BMI 29.12 kg/m   BP Readings from Last 3 Encounters:  02/26/19 (!) 190/80  02/12/19 (!) 184/57  01/08/19 (!) 148/80    Wt Readings from Last 3 Encounters:  02/26/19 175 lb (79.4 kg)  02/12/19 178 lb (80.7 kg)  01/08/19 176 lb (79.8 kg)    Physical Exam Vitals signs reviewed.  Constitutional:      Appearance: Normal appearance.  HENT:     Mouth/Throat:     Mouth: Mucous membranes are moist.  Eyes:     General: No scleral icterus. Neck:     Musculoskeletal: Neck supple.  Cardiovascular:     Rate and Rhythm: Normal rate and regular rhythm.     Heart sounds: No murmur.  Pulmonary:     Effort: Pulmonary effort is normal.     Breath sounds: No stridor. No wheezing, rhonchi or rales.  Abdominal:     General: Abdomen is flat. Bowel sounds are normal. There is no distension.     Palpations: There is no hepatomegaly or splenomegaly.  Musculoskeletal: Normal range of motion.     Right lower leg: No edema.  Lymphadenopathy:      Cervical: No cervical adenopathy.  Skin:    General: Skin is dry.  Neurological:     General: No focal deficit present.     Mental Status: She is alert.  Psychiatric:        Mood and Affect: Mood normal.        Behavior: Behavior normal.     Lab Results  Component Value Date   WBC 5.4 10/08/2018   HGB 13.2 10/08/2018   HCT 39.7 10/08/2018   PLT 188.0 10/08/2018   GLUCOSE 145 (H) 01/08/2019   CHOL 109 10/08/2018   TRIG 158.0 (H) 10/08/2018   HDL 34.80 (L) 10/08/2018   LDLDIRECT  160.2 03/01/2010   LDLCALC 42 10/08/2018   ALT 20 10/08/2018   AST 18 10/08/2018   NA 138 01/08/2019   K 3.6 01/08/2019   CL 100 01/08/2019   CREATININE 0.76 01/08/2019   BUN 13 01/08/2019   CO2 30 01/08/2019   TSH 0.27 (L) 10/08/2018   INR 1.05 07/16/2014   HGBA1C 8.4 (H) 01/08/2019   MICROALBUR 42.3 (H) 02/26/2019    No results found.  Assessment & Plan:   Keitha was seen today for hypertension, diabetes and urinary tract infection.  Diagnoses and all orders for this visit:  B12 deficiency -     cyanocobalamin ((VITAMIN B-12)) injection 1,000 mcg  Uncontrolled type 2 diabetes mellitus with peripheral artery disease ([place])- She is experiencing some nocturnal hypoglycemia so will decrease the basal insulin dose to 60 units a day. -     Insulin Glargine (BASAGLAR KWIKPEN) 100 UNIT/ML SOPN; Inject 0.6 mLs (60 Units total) into the skin daily. -     Microalbumin / creatinine urine ratio; Future -     HM Diabetes Foot Exam  UTI due to extended-spectrum beta lactamase (ESBL) producing Escherichia coli- I think she has asymptomatic bacteriuria.  I will monitor her urinalysis and her urine culture. -     Urinalysis, Routine w reflex microscopic; Future -     CULTURE, URINE COMPREHENSIVE; Future  Essential hypertension, benign- Her blood pressure is not adequately well controlled.  I have asked her to stay on the current dose of the CCB and to upgrade to a more potent ARB and thiazide diuretic. -      indapamide (LOZOL) 2.5 MG tablet; Take 1 tablet (2.5 mg total) by mouth daily. -     irbesartan (AVAPRO) 150 MG tablet; Take 1 tablet (150 mg total) by mouth daily. -     Urinalysis, Routine w reflex microscopic; Future   I have discontinued Lauretta Dolby's insulin detemir and losartan-hydrochlorothiazide. I have also changed her [place]. Additionally, I am having her start on indapamide and irbesartan. Lastly, I am having her maintain her Pen Needles, latanoprost, pravastatin, ezetimibe, potassium chloride SA, OneTouch Verio IQ System, glucose blood, omeprazole, Trulicity, insulin aspart, metFORMIN, Cartia XT, magnesium oxide, rivaroxaban, dorzolamide, acetaminophen, sucralfate, AMBULATORY NON FORMULARY MEDICATION, and HYDROcodone-acetaminophen. We administered cyanocobalamin.  Meds ordered this encounter  Medications  . cyanocobalamin ((VITAMIN B-12)) injection 1,000 mcg  . Insulin Glargine (BASAGLAR KWIKPEN) 100 UNIT/ML SOPN    Sig: Inject 0.6 mLs (60 Units total) into the skin daily.    Dispense:  15 mL    Refill:  1    Please disregard previous rx.  . indapamide (LOZOL) 2.5 MG tablet    Sig: Take 1 tablet (2.5 mg total) by mouth daily.    Dispense:  90 tablet    Refill:  0  . irbesartan (AVAPRO) 150 MG tablet    Sig: Take 1 tablet (150 mg total) by mouth daily.    Dispense:  90 tablet    Refill:  0     Follow-up: Return in about 6 weeks (around 04/09/2019).  Scarlette Calico, MD

## 2019-02-27 DIAGNOSIS — S98132A Complete traumatic amputation of one left lesser toe, initial encounter: Secondary | ICD-10-CM | POA: Diagnosis not present

## 2019-02-27 DIAGNOSIS — E1151 Type 2 diabetes mellitus with diabetic peripheral angiopathy without gangrene: Secondary | ICD-10-CM | POA: Diagnosis not present

## 2019-02-27 DIAGNOSIS — Z8631 Personal history of diabetic foot ulcer: Secondary | ICD-10-CM | POA: Diagnosis not present

## 2019-02-28 LAB — CULTURE, URINE COMPREHENSIVE
MICRO NUMBER:: 1039906
SPECIMEN QUALITY:: ADEQUATE

## 2019-03-04 ENCOUNTER — Other Ambulatory Visit: Payer: Self-pay | Admitting: Internal Medicine

## 2019-03-05 ENCOUNTER — Encounter (INDEPENDENT_AMBULATORY_CARE_PROVIDER_SITE_OTHER): Payer: PPO | Admitting: Ophthalmology

## 2019-03-10 ENCOUNTER — Telehealth: Payer: Self-pay

## 2019-03-10 NOTE — Telephone Encounter (Signed)
Pt called about the urine culture results.   Pt is states that she is having some slight symptoms. Please advise.

## 2019-03-10 NOTE — Telephone Encounter (Signed)
Copied from [place] [id number]. Topic: General - Other >> Mar 10, 2019  1:52 PM Yvette Rack wrote: Reason for CRM: Pt requests call back from [place].

## 2019-03-10 NOTE — Telephone Encounter (Signed)
The clx was positive for the same bacteria as always  TJ

## 2019-03-11 NOTE — Telephone Encounter (Signed)
Before I call the patient - Did you want to treat based on cx result?

## 2019-03-12 NOTE — Telephone Encounter (Signed)
Patient called to speak with Colletta [place] because she said she was supposed to get a call back to discuss further her test results and has not received a call.  Please advise and call back at [phone]

## 2019-03-13 ENCOUNTER — Other Ambulatory Visit: Payer: Self-pay | Admitting: Internal Medicine

## 2019-03-13 DIAGNOSIS — B9629 Other Escherichia coli [E. coli] as the cause of diseases classified elsewhere: Secondary | ICD-10-CM

## 2019-03-13 DIAGNOSIS — N390 Urinary tract infection, site not specified: Secondary | ICD-10-CM

## 2019-03-13 MED ORDER — NITROFURANTOIN MONOHYD MACRO 100 MG PO CAPS: 100.0000 mg | ORAL_CAPSULE | Freq: Two times a day (BID) | ORAL | 0 refills | Status: AC

## 2019-03-13 NOTE — Telephone Encounter (Signed)
Pt informed Macrobid was sent into the pharmacy.

## 2019-03-13 NOTE — Telephone Encounter (Signed)
Pt called back and stated that she has blood in her urine that is visible.   Please advise.

## 2019-03-13 NOTE — Telephone Encounter (Signed)
RX sent to [facility]  TJ 

## 2019-03-13 NOTE — Telephone Encounter (Signed)
lvm for pt to call back. Will take call if I am available.

## 2019-03-20 ENCOUNTER — Ambulatory Visit: Payer: PPO

## 2019-03-25 ENCOUNTER — Other Ambulatory Visit: Payer: Self-pay | Admitting: Internal Medicine

## 2019-03-25 DIAGNOSIS — N390 Urinary tract infection, site not specified: Secondary | ICD-10-CM

## 2019-03-25 MED ORDER — NITROFURANTOIN MONOHYD MACRO 100 MG PO CAPS: 100.0000 mg | ORAL_CAPSULE | Freq: Two times a day (BID) | ORAL | 0 refills | Status: AC

## 2019-03-25 NOTE — Telephone Encounter (Signed)
Rx sent for another course of antibiotics  TJ

## 2019-03-25 NOTE — Telephone Encounter (Signed)
Pt called in and stated that her symptoms cleared for a few days but she is back to urinating blood. Pt has an appointment with urology on January 6th (that was the soonest they could see her). Please advise if pt needs to come back and see you or if another abx is neeedd.

## 2019-03-25 NOTE — Telephone Encounter (Signed)
Left detailed message for pt (per her request) that rx for Macrobid has been resent.

## 2019-04-10 ENCOUNTER — Encounter (INDEPENDENT_AMBULATORY_CARE_PROVIDER_SITE_OTHER): Payer: PPO | Admitting: Ophthalmology

## 2019-05-07 ENCOUNTER — Encounter (INDEPENDENT_AMBULATORY_CARE_PROVIDER_SITE_OTHER): Payer: PPO | Admitting: Ophthalmology

## 2019-05-15 ENCOUNTER — Other Ambulatory Visit: Payer: Self-pay

## 2019-05-15 ENCOUNTER — Ambulatory Visit
Admission: RE | Admit: 2019-05-15 | Discharge: 2019-05-15 | Disposition: A | Discharge status: Home / Self Care | Payer: PPO | Source: Ambulatory Visit | Attending: Internal Medicine | Admitting: Internal Medicine

## 2019-05-15 DIAGNOSIS — Z1231 Encounter for screening mammogram for malignant neoplasm of breast: Secondary | ICD-10-CM | POA: Diagnosis not present

## 2019-05-16 LAB — HM MAMMOGRAPHY

## 2019-05-17 ENCOUNTER — Other Ambulatory Visit: Payer: Self-pay | Admitting: Internal Medicine

## 2019-05-17 DIAGNOSIS — I7025 Atherosclerosis of native arteries of other extremities with ulceration: Secondary | ICD-10-CM

## 2019-05-17 DIAGNOSIS — E1151 Type 2 diabetes mellitus with diabetic peripheral angiopathy without gangrene: Secondary | ICD-10-CM

## 2019-05-17 DIAGNOSIS — I739 Peripheral vascular disease, unspecified: Secondary | ICD-10-CM

## 2019-05-29 ENCOUNTER — Ambulatory Visit: Payer: PPO | Admitting: Internal Medicine

## 2019-06-04 ENCOUNTER — Encounter: Payer: Self-pay | Admitting: Internal Medicine

## 2019-06-04 ENCOUNTER — Telehealth: Payer: Self-pay

## 2019-06-04 ENCOUNTER — Ambulatory Visit (INDEPENDENT_AMBULATORY_CARE_PROVIDER_SITE_OTHER): Payer: PPO | Admitting: Internal Medicine

## 2019-06-04 ENCOUNTER — Other Ambulatory Visit: Payer: Self-pay

## 2019-06-04 VITALS — BP 182/84 | HR 56 | Temp 97.5°F | Resp 16 | Ht 65.0 in | Wt 186.0 lb

## 2019-06-04 DIAGNOSIS — I4891 Unspecified atrial fibrillation: Secondary | ICD-10-CM

## 2019-06-04 DIAGNOSIS — Z124 Encounter for screening for malignant neoplasm of cervix: Secondary | ICD-10-CM

## 2019-06-04 DIAGNOSIS — Z0000 Encounter for general adult medical examination without abnormal findings: Secondary | ICD-10-CM

## 2019-06-04 DIAGNOSIS — G63 Polyneuropathy in diseases classified elsewhere: Secondary | ICD-10-CM | POA: Diagnosis not present

## 2019-06-04 DIAGNOSIS — I10 Essential (primary) hypertension: Secondary | ICD-10-CM | POA: Diagnosis not present

## 2019-06-04 DIAGNOSIS — E538 Deficiency of other specified B group vitamins: Secondary | ICD-10-CM | POA: Diagnosis not present

## 2019-06-04 DIAGNOSIS — D509 Iron deficiency anemia, unspecified: Secondary | ICD-10-CM

## 2019-06-04 DIAGNOSIS — E042 Nontoxic multinodular goiter: Secondary | ICD-10-CM | POA: Diagnosis not present

## 2019-06-04 DIAGNOSIS — M1990 Unspecified osteoarthritis, unspecified site: Secondary | ICD-10-CM | POA: Diagnosis not present

## 2019-06-04 DIAGNOSIS — E519 Thiamine deficiency, unspecified: Secondary | ICD-10-CM

## 2019-06-04 DIAGNOSIS — Z0001 Encounter for general adult medical examination with abnormal findings: Secondary | ICD-10-CM

## 2019-06-04 DIAGNOSIS — E785 Hyperlipidemia, unspecified: Secondary | ICD-10-CM | POA: Diagnosis not present

## 2019-06-04 DIAGNOSIS — E1165 Type 2 diabetes mellitus with hyperglycemia: Secondary | ICD-10-CM | POA: Diagnosis not present

## 2019-06-04 DIAGNOSIS — E1151 Type 2 diabetes mellitus with diabetic peripheral angiopathy without gangrene: Secondary | ICD-10-CM

## 2019-06-04 DIAGNOSIS — IMO0002 Reserved for concepts with insufficient information to code with codable children: Secondary | ICD-10-CM

## 2019-06-04 LAB — BASIC METABOLIC PANEL
BUN: 15 mg/dL (ref 6–23)
CO2: 29 mEq/L (ref 19–32)
Calcium: 9.8 mg/dL (ref 8.4–10.5)
Chloride: 104 mEq/L (ref 96–112)
Creatinine, Ser: 0.77 mg/dL (ref 0.40–1.20)
GFR: 91.83 mL/min (ref >60.00)
Glucose, Bld: 68 mg/dL — ABNORMAL LOW (ref 70–99)
Potassium: 3.7 mEq/L (ref 3.5–5.1)
Sodium: 141 mEq/L (ref 135–145)

## 2019-06-04 LAB — CBC WITH DIFFERENTIAL/PLATELET
Basophils Absolute: 0 10*3/uL (ref 0.0–0.1)
Basophils Relative: 0.4 % (ref 0.0–3.0)
Eosinophils Absolute: 0.1 10*3/uL (ref 0.0–0.7)
Eosinophils Relative: 0.8 % (ref 0.0–5.0)
HCT: 39.6 % (ref 36.0–46.0)
Hemoglobin: 13.3 g/dL (ref 12.0–15.0)
Lymphocytes Relative: 31.6 % (ref 12.0–46.0)
Lymphs Abs: 2.8 10*3/uL (ref 0.7–4.0)
MCHC: 33.6 g/dL (ref 30.0–36.0)
MCV: 88.1 fl (ref 78.0–100.0)
Monocytes Absolute: 0.5 10*3/uL (ref 0.1–1.0)
Monocytes Relative: 5.7 % (ref 3.0–12.0)
Neutro Abs: 5.4 10*3/uL (ref 1.4–7.7)
Neutrophils Relative %: 61.5 % (ref 43.0–77.0)
Platelets: 239 10*3/uL (ref 150.0–400.0)
RBC: 4.49 Mil/uL (ref 3.87–5.11)
RDW: 13.5 % (ref 11.5–15.5)
WBC: 8.8 10*3/uL (ref 4.0–10.5)

## 2019-06-04 LAB — LIPID PANEL
Cholesterol: 134 mg/dL (ref 0–200)
HDL: 47.9 mg/dL (ref >39.00)
LDL Cholesterol: 64 mg/dL (ref 0–99)
NonHDL: 86.39
Total CHOL/HDL Ratio: 3
Triglycerides: 114 mg/dL (ref 0.0–149.0)
VLDL: 22.8 mg/dL (ref 0.0–40.0)

## 2019-06-04 LAB — IBC PANEL
Iron: 78 ug/dL (ref 42–145)
Saturation Ratios: 26.2 % (ref 20.0–50.0)
Transferrin: 213 mg/dL (ref 212.0–360.0)

## 2019-06-04 LAB — HEMOGLOBIN A1C: Hgb A1c MFr Bld: 7.3 % — ABNORMAL HIGH (ref 4.6–6.5)

## 2019-06-04 LAB — TSH: TSH: 0.42 u[IU]/mL (ref 0.35–4.50)

## 2019-06-04 LAB — FERRITIN: Ferritin: 103.7 ng/mL (ref 10.0–291.0)

## 2019-06-04 LAB — FOLATE: Folate: 15.6 ng/mL (ref >5.9)

## 2019-06-04 MED ORDER — RIVAROXABAN 20 MG PO TABS: 20.0000 mg | ORAL_TABLET | Freq: Every day | ORAL | 0 refills | Status: DC

## 2019-06-04 MED ORDER — VICTOZA 18 MG/3ML ~~LOC~~ SOPN: 1.8000 mg | PEN_INJECTOR | Freq: Every day | SUBCUTANEOUS | 1 refills | Status: DC

## 2019-06-04 MED ORDER — RIVAROXABAN 20 MG PO TABS: 20.0000 mg | ORAL_TABLET | Freq: Every day | ORAL | 1 refills | Status: DC

## 2019-06-04 MED ORDER — CYANOCOBALAMIN 1000 MCG/ML IJ SOLN
1000.0000 ug | Freq: Once | INTRAMUSCULAR | Status: AC
  Administered 2019-06-04: 1000 ug via INTRAMUSCULAR

## 2019-06-04 MED ORDER — EDARBYCLOR 40-25 MG PO TABS: 1.0000 | ORAL_TABLET | Freq: Every day | ORAL | 0 refills | Status: DC

## 2019-06-04 NOTE — Telephone Encounter (Signed)
Pt would like to try Victoza for weight loss. Is that something that can be added.

## 2019-06-04 NOTE — Patient Instructions (Signed)
Health Maintenance, Female Adopting a healthy lifestyle and getting preventive care are important in promoting health and wellness. Ask your health care provider about:  The right schedule for you to have regular tests and exams.  Things you can do on your own to prevent diseases and keep yourself healthy. What should I know about diet, weight, and exercise? Eat a healthy diet   Eat a diet that includes plenty of vegetables, fruits, low-fat dairy products, and lean protein.  Do not eat a lot of foods that are high in solid fats, added sugars, or sodium. Maintain a healthy weight Body mass index (BMI) is used to identify weight problems. It estimates body fat based on height and weight. Your health care provider can help determine your BMI and help you achieve or maintain a healthy weight. Get regular exercise Get regular exercise. This is one of the most important things you can do for your health. Most adults should:  Exercise for at least 150 minutes each week. The exercise should increase your heart rate and make you sweat (moderate-intensity exercise).  Do strengthening exercises at least twice a week. This is in addition to the moderate-intensity exercise.  Spend less time sitting. Even light physical activity can be beneficial. Watch cholesterol and blood lipids Have your blood tested for lipids and cholesterol at 63 years of age, then have this test every 5 years. Have your cholesterol levels checked more often if:  Your lipid or cholesterol levels are high.  You are older than 63 years of age.  You are at high risk for heart disease. What should I know about cancer screening? Depending on your health history and family history, you may need to have cancer screening at various ages. This may include screening for:  Breast cancer.  Cervical cancer.  Colorectal cancer.  Skin cancer.  Lung cancer. What should I know about heart disease, diabetes, and high blood  pressure? Blood pressure and heart disease  High blood pressure causes heart disease and increases the risk of stroke. This is more likely to develop in people who have high blood pressure readings, are of African descent, or are overweight.  Have your blood pressure checked: ? Every 3-5 years if you are 18-39 years of age. ? Every year if you are 40 years old or older. Diabetes Have regular diabetes screenings. This checks your fasting blood sugar level. Have the screening done:  Once every three years after age 40 if you are at a normal weight and have a low risk for diabetes.  More often and at a younger age if you are overweight or have a high risk for diabetes. What should I know about preventing infection? Hepatitis B If you have a higher risk for hepatitis B, you should be screened for this virus. Talk with your health care provider to find out if you are at risk for hepatitis B infection. Hepatitis C Testing is recommended for:  Everyone born from 1945 through 1965.  Anyone with known risk factors for hepatitis C. Sexually transmitted infections (STIs)  Get screened for STIs, including gonorrhea and chlamydia, if: ? You are sexually active and are younger than 63 years of age. ? You are older than 63 years of age and your health care provider tells you that you are at risk for this type of infection. ? Your sexual activity has changed since you were last screened, and you are at increased risk for chlamydia or gonorrhea. Ask your health care provider if   you are at risk.  Ask your health care provider about whether you are at high risk for HIV. Your health care provider may recommend a prescription medicine to help prevent HIV infection. If you choose to take medicine to prevent HIV, you should first get tested for HIV. You should then be tested every 3 months for as long as you are taking the medicine. Pregnancy  If you are about to stop having your period (premenopausal) and  you may become pregnant, seek counseling before you get pregnant.  Take 400 to 800 micrograms (mcg) of folic acid every day if you become pregnant.  Ask for birth control (contraception) if you want to prevent pregnancy. Osteoporosis and menopause Osteoporosis is a disease in which the bones lose minerals and strength with aging. This can result in bone fractures. If you are 65 years old or older, or if you are at risk for osteoporosis and fractures, ask your health care provider if you should:  Be screened for bone loss.  Take a calcium or vitamin D supplement to lower your risk of fractures.  Be given hormone replacement therapy (HRT) to treat symptoms of menopause. Follow these instructions at home: Lifestyle  Do not use any products that contain nicotine or tobacco, such as cigarettes, e-cigarettes, and chewing tobacco. If you need help quitting, ask your health care provider.  Do not use street drugs.  Do not share needles.  Ask your health care provider for help if you need support or information about quitting drugs. Alcohol use  Do not drink alcohol if: ? Your health care provider tells you not to drink. ? You are pregnant, may be pregnant, or are planning to become pregnant.  If you drink alcohol: ? Limit how much you use to 0-1 drink a day. ? Limit intake if you are breastfeeding.  Be aware of how much alcohol is in your drink. In the [place] one drink equals one 12 oz bottle of beer (355 mL), one 5 oz glass of wine (148 mL), or one 1 oz glass of hard liquor (44 mL). General instructions  Schedule regular health, dental, and eye exams.  Stay current with your vaccines.  Tell your health care provider if: ? You often feel depressed. ? You have ever been abused or do not feel safe at home. Summary  Adopting a healthy lifestyle and getting preventive care are important in promoting health and wellness.  Follow your health care provider's instructions about healthy  diet, exercising, and getting tested or screened for diseases.  Follow your health care provider's instructions on monitoring your cholesterol and blood pressure. This information is not intended to replace advice given to you by your health care provider. Make sure you discuss any questions you have with your health care provider. Document Revised: 04/10/2018 Document Reviewed: 04/10/2018 Elsevier Patient Education  2020 [employer]  

## 2019-06-04 NOTE — Progress Notes (Signed)
Subjective:  Patient ID: Theresa Harrington, female    DOB: [birth year]  Age: 64 y.o. MRN: [id number]  CC: Annual Exam, Hypertension, Hyperlipidemia, and Diabetes  This visit occurred during the SARS-CoV-2 public health emergency.  Safety protocols were in place, including screening questions prior to the visit, additional usage of staff PPE, and extensive cleaning of exam room while observing appropriate contact time as indicated for disinfecting solutions.    HPI Theresa Harrington presents for a CPX.  She complains of weight gain.  She has not been working much on her lifestyle modifications.  She is not taking indapamide for irbesartan because they caused dizziness.  Outpatient Medications Prior to Visit  Medication Sig Dispense Refill  . acetaminophen (TYLENOL) 650 MG CR tablet Take 1,300 mg by mouth every 8 (eight) hours as needed for pain.    [name] AMBULATORY NON FORMULARY MEDICATION 5m-Viscous lidocaine, 974m10mg/5ml Diyclomine, 27010malox Swish and Swallow 66m82m mouth four times daily. 550 mL 1  . Blood Glucose Monitoring Suppl (ONETOUCH VERIO IQ SYSTEM) w/Device KIT USE TO CHECK SUGAR DAILY 1 kit 0  . CARTIA XT 120 MG 24 hr capsule Take 1 capsule by mouth once daily (Patient taking differently: Take 120 mg by mouth daily. ) 90 capsule 3  . dorzolamide (TRUSOPT) 2 % ophthalmic solution Place 1 drop into the right eye 2 (two) times daily.    . dorzolamide-timolol (COSOPT) 22.3-6.8 MG/ML ophthalmic solution INSTILL 1 DROP INTO RIGHT EYE TWICE DAILY    . ezetimibe (ZETIA) 10 MG tablet Take 10 mg by mouth at bedtime.     . glMarland Kitchencose blood test strip Use to check blood sugar twice daily. DX: E11.8 200 each 3  . insulin aspart (NOVOLOG) 100 UNIT/ML injection Inject 14-20 Units into the skin 3 (three) times daily before meals. (Patient taking differently: Inject 14 Units into the skin 2 (two) times daily with a meal. ) 10 mL 2  . Insulin Glargine (BASAGLAR KWIKPEN) 100 UNIT/ML SOPN Inject 0.6 mLs (60  Units total) into the skin daily. 15 mL 1  . Insulin Pen Needle (PEN NEEDLES) 32G X 4 MM MISC Inject 1 pen as directed 4 (four) times daily. Use to inject levemir or novolog as directed. 400 each 3  . latanoprost (XALATAN) 0.005 % ophthalmic solution Place 1 drop into both eyes at bedtime.     . magnesium oxide (MAG-OX) 400 MG tablet Take by mouth.    . metFORMIN (GLUCOPHAGE) 1000 MG tablet TAKE 1 TABLET BY MOUTH TWICE DAILY WITH MEALS 180 tablet 0  . omeprazole (PRILOSEC) 40 MG capsule Take 1 capsule (40 mg total) by mouth 2 (two) times daily before a meal. 90 capsule 2  . potassium chloride SA (K-DUR) 20 MEQ tablet Take 1 tablet (20 mEq total) by mouth 2 (two) times daily. (Patient taking differently: Take 20 mEq by mouth daily. ) 180 tablet 1  . pravastatin (PRAVACHOL) 40 MG tablet Take 1 tablet by mouth once daily 90 tablet 0  . thiamine 100 MG tablet TAKE 1 TABLET BY MOUTH ONCE DAILY    . Dulaglutide (TRULICITY) 0.751.630WG/6.6ZLN Inject 0.75 mg as directed every Monday. (Patient taking differently: Inject 0.75 mg as directed every Wednesday. ) 12 pen 1  . HYDROcodone-acetaminophen (HYCET) 7.5-325 mg/15 ml solution Take 15 mLs by mouth 4 (four) times daily as needed for moderate pain. 120 mL 0  . indapamide (LOZOL) 2.5 MG tablet Take 1 tablet (2.5 mg total) by mouth daily. 90 tablet 0  .  irbesartan (AVAPRO) 150 MG tablet Take 1 tablet (150 mg total) by mouth daily. 90 tablet 0  . magnesium oxide (MAG-OX) 400 (241.3 Mg) MG tablet Take 1 tablet (400 mg total) by mouth daily. 90 tablet 1  . sucralfate (CARAFATE) 1 GM/10ML suspension Take 10 mLs (1 g total) by mouth 4 (four) times daily. 420 mL 1  . dicyclomine (BENTYL) 10 MG/5ML solution     . SHINGRIX injection     . losartan-hydrochlorothiazide (HYZAAR) 100-12.5 MG tablet Take 1 tablet by mouth daily.    . rivaroxaban (XARELTO) 20 MG TABS tablet Take 1 tablet (20 mg total) by mouth daily. 90 tablet 1   No facility-administered medications  prior to visit.    ROS Review of Systems  Constitutional: Positive for unexpected weight change (wt gain). Negative for diaphoresis and fatigue.  HENT: Negative.   Eyes: Negative for visual disturbance.  Respiratory: Negative for cough, chest tightness, shortness of breath and wheezing.   Cardiovascular: Negative for chest pain, palpitations and leg swelling.  Gastrointestinal: Negative for abdominal pain, constipation, diarrhea, nausea and vomiting.  Endocrine: Negative.   Genitourinary: Negative.  Negative for difficulty urinating, dysuria and hematuria.  Musculoskeletal: Positive for arthralgias. Negative for back pain, myalgias and neck pain.  Skin: Negative.  Negative for color change and pallor.  Neurological: Negative for dizziness, weakness, light-headedness and headaches.  Hematological: Negative for adenopathy. Does not bruise/bleed easily.  Psychiatric/Behavioral: Negative.     Objective:  BP (!) 182/84 (BP Location: Left Arm, Patient Position: Sitting, Cuff Size: Normal)   Pulse (!) 56   Temp (!) 97.5 F (36.4 C) (Oral)   Resp 16   Ht _0  (1.651 m)   Wt 186 lb (84.4 kg)   LMP  (LMP Unknown)   SpO2 97%   BMI 30.95 kg/m   BP Readings from Last 3 Encounters:  06/04/19 (!) 182/84  02/26/19 (!) 190/80  02/12/19 (!) 184/57    Wt Readings from Last 3 Encounters:  06/04/19 186 lb (84.4 kg)  02/26/19 175 lb (79.4 kg)  02/12/19 178 lb (80.7 kg)    Physical Exam Vitals reviewed.  Constitutional:      Appearance: She is obese.  HENT:     Nose: Nose normal.     Mouth/Throat:     Mouth: Mucous membranes are moist.  Eyes:     General: No scleral icterus.    Conjunctiva/sclera: Conjunctivae normal.  Neck:     Thyroid: No thyroid mass, thyromegaly or thyroid tenderness.  Cardiovascular:     Rate and Rhythm: Normal rate and regular rhythm.     Heart sounds: No murmur.  Pulmonary:     Effort: Pulmonary effort is normal.     Breath sounds: No stridor. No  wheezing, rhonchi or rales.  Abdominal:     General: Abdomen is protuberant. Bowel sounds are normal. There is no distension.     Palpations: Abdomen is soft. There is no hepatomegaly or splenomegaly.     Tenderness: There is no abdominal tenderness.  Musculoskeletal:        General: Normal range of motion.     Cervical back: Neck supple.     Right lower leg: No edema.     Left lower leg: No edema.  Lymphadenopathy:     Cervical: No cervical adenopathy.  Skin:    General: Skin is warm and dry.  Neurological:     General: No focal deficit present.     Mental Status: She is alert.  Psychiatric:        Mood and Affect: Mood normal.        Behavior: Behavior normal.     Lab Results  Component Value Date   WBC 8.8 06/04/2019   HGB 13.3 06/04/2019   HCT 39.6 06/04/2019   PLT 239.0 06/04/2019   GLUCOSE 68 (L) 06/04/2019   CHOL 134 06/04/2019   TRIG 114.0 06/04/2019   HDL 47.90 06/04/2019   LDLDIRECT 160.2 03/01/2010   LDLCALC 64 06/04/2019   ALT 20 10/08/2018   AST 18 10/08/2018   NA 141 06/04/2019   K 3.7 06/04/2019   CL 104 06/04/2019   CREATININE 0.77 06/04/2019   BUN 15 06/04/2019   CO2 29 06/04/2019   TSH 0.42 06/04/2019   INR 1.05 07/16/2014   HGBA1C 7.3 (H) 06/04/2019   MICROALBUR 42.3 (H) 02/26/2019    MM 3D SCREEN BREAST BILATERAL  Result Date: 05/16/2019 CLINICAL DATA:  Screening. EXAM: DIGITAL SCREENING BILATERAL MAMMOGRAM WITH TOMO AND CAD COMPARISON:  Previous exam(s). ACR Breast Density Category b: There are scattered areas of fibroglandular density. FINDINGS: Bilateral postsurgical changes of a mammoplasty are noted. There are no findings suspicious for malignancy. Images were processed with CAD. IMPRESSION: No mammographic evidence of malignancy. A result letter of this screening mammogram will be mailed directly to the patient. RECOMMENDATION: Screening mammogram in one year. (Code:SM-B-01Y) BI-RADS CATEGORY  2: Benign. Electronically Signed   By: Zerita Boers M.D.   On: 05/16/2019 15:29    Assessment & Plan:   Theresa Harrington was seen today for annual exam, hypertension, hyperlipidemia and diabetes.  Diagnoses and all orders for this visit:  Uncontrolled type 2 diabetes mellitus with peripheral artery disease ([place])- Her A1c is down to 7.3%.  Improvement noted.  I recommend that she start taking an ARB.  She complains of weight gain so I recommended that she start using a GLP-1 agonist. -     Basic metabolic panel -     Hemoglobin A1c -     HM Diabetes Foot Exam -     Azilsartan-Chlorthalidone (EDARBYCLOR) 40-25 MG TABS; Take 1 tablet by mouth daily. -     liraglutide (VICTOZA) 18 MG/3ML SOPN; Inject 0.3 mLs (1.8 mg total) into the skin daily.  Essential hypertension, benign- Her blood pressure is not adequately well controlled.  I recommended that she start the combination of an ARB and thiazide diuretic. -     Azilsartan-Chlorthalidone (EDARBYCLOR) 40-25 MG TABS; Take 1 tablet by mouth daily.  Thiamine deficiency- Her H&H are normal.  I will monitor her thiamine level. -     CBC with Differential/Platelet -     Vitamin B1  Iron deficiency anemia, unspecified iron deficiency anemia type- Her H&H are normal now. -     IBC panel -     CBC with Differential/Platelet -     Ferritin  Vitamin B12 deficiency neuropathy ([place])- She is doing well on parenteral B12 replacement therapy. -     Cancel: Vitamin B12 -     CBC with Differential/Platelet -     Folate -     cyanocobalamin ((VITAMIN B-12)) injection 1,000 mcg  Hyperlipidemia with target LDL less than 100- She has achieved her LDL goal is doing well on the statin. -     Lipid panel  GOITER, MULTINODULAR- There is no goiter on exam today and her TSH is normal.  No complications noted related to this. -     TSH  Atrial fibrillation with  RVR ([place])- She has good rate and rhythm control.  Will continue the DOAC. Will cont the CCB. -     rivaroxaban (XARELTO) 20 MG TABS tablet; Take 1 tablet  (20 mg total) by mouth daily.  Screening for cervical cancer -     Ambulatory referral to Gynecology  Routine general medical examination at a health care facility- Exam completed, labs reviewed, vaccines reviewed and updated, screening for breast and colon cancer up-to-date, she is referred for cervical cancer screening.  Arthritis -     Rheumatoid Factor (IgA, IgG, IgM)  Other orders -     Discontinue: rivaroxaban (XARELTO) 20 MG TABS tablet; Take 1 tablet (20 mg total) by mouth daily.   I have discontinued Theresa Harrington's Trulicity, sucralfate, HYDROcodone-acetaminophen, indapamide, irbesartan, and losartan-hydrochlorothiazide. I am also having her start on Edarbyclor and Victoza. Additionally, I am having her maintain her Pen Needles, latanoprost, ezetimibe, potassium chloride SA, OneTouch Verio IQ System, glucose blood, omeprazole, insulin aspart, Cartia XT, dorzolamide, acetaminophen, AMBULATORY NON FORMULARY MEDICATION, Basaglar KwikPen, metFORMIN, pravastatin, dicyclomine, dorzolamide-timolol, magnesium oxide, thiamine, Shingrix, and rivaroxaban. We administered cyanocobalamin.  Meds ordered this encounter  Medications  . DISCONTD: rivaroxaban (XARELTO) 20 MG TABS tablet    Sig: Take 1 tablet (20 mg total) by mouth daily.    Dispense:  84 tablet    Refill:  0    12 bottles  . rivaroxaban (XARELTO) 20 MG TABS tablet    Sig: Take 1 tablet (20 mg total) by mouth daily.    Dispense:  90 tablet    Refill:  1    12 bottles  . Azilsartan-Chlorthalidone (EDARBYCLOR) 40-25 MG TABS    Sig: Take 1 tablet by mouth daily.    Dispense:  56 tablet    Refill:  0  . cyanocobalamin ((VITAMIN B-12)) injection 1,000 mcg  . liraglutide (VICTOZA) 18 MG/3ML SOPN    Sig: Inject 0.3 mLs (1.8 mg total) into the skin daily.    Dispense:  3 pen    Refill:  1     Follow-up: Return in about 2 months (around 08/02/2019).  Scarlette Calico, MD

## 2019-06-07 ENCOUNTER — Encounter: Payer: Self-pay | Admitting: Internal Medicine

## 2019-06-07 ENCOUNTER — Other Ambulatory Visit: Payer: Self-pay | Admitting: Internal Medicine

## 2019-06-07 DIAGNOSIS — E519 Thiamine deficiency, unspecified: Secondary | ICD-10-CM

## 2019-06-07 LAB — VITAMIN B1: Vitamin B1 (Thiamine): 7 nmol/L — ABNORMAL LOW (ref 8–30)

## 2019-06-07 MED ORDER — THIAMINE HCL 100 MG PO TABS: 100.0000 mg | ORAL_TABLET | ORAL | 1 refills | Status: DC

## 2019-06-07 NOTE — Telephone Encounter (Signed)
PCP has sent in rx for victoza.   Will need to complete patient assistance and inform pt of same.

## 2019-06-09 NOTE — Telephone Encounter (Signed)
Pt contacted and informed Victoza was sent in. Pt asked about the Trulicity. Reviewed medication list and informed that the Trulicity was dc'ed and the Victoza took its place for now.   Pt stated understanding and pt will let me know how she feels after taking the Victoza for a few weeks.

## 2019-06-11 ENCOUNTER — Encounter (INDEPENDENT_AMBULATORY_CARE_PROVIDER_SITE_OTHER): Payer: PPO | Admitting: Ophthalmology

## 2019-06-12 LAB — RHEUMATOID FACTOR (IGA, IGG, IGM)
Rheumatoid Factor (IgA): <5 U (ref <=6)
Rheumatoid Factor (IgG): <5 U (ref <=6)
Rheumatoid Factor (IgM): <5 U (ref <=6)

## 2019-06-18 ENCOUNTER — Other Ambulatory Visit: Payer: Self-pay

## 2019-06-18 ENCOUNTER — Encounter: Payer: Self-pay | Admitting: Internal Medicine

## 2019-06-18 ENCOUNTER — Ambulatory Visit (INDEPENDENT_AMBULATORY_CARE_PROVIDER_SITE_OTHER): Payer: PPO | Admitting: Internal Medicine

## 2019-06-18 VITALS — BP 142/78 | HR 64 | Temp 97.9°F | Resp 16 | Ht 65.0 in | Wt 183.0 lb

## 2019-06-18 DIAGNOSIS — E1151 Type 2 diabetes mellitus with diabetic peripheral angiopathy without gangrene: Secondary | ICD-10-CM | POA: Diagnosis not present

## 2019-06-18 DIAGNOSIS — E1165 Type 2 diabetes mellitus with hyperglycemia: Secondary | ICD-10-CM

## 2019-06-18 DIAGNOSIS — I10 Essential (primary) hypertension: Secondary | ICD-10-CM | POA: Diagnosis not present

## 2019-06-18 DIAGNOSIS — IMO0002 Reserved for concepts with insufficient information to code with codable children: Secondary | ICD-10-CM

## 2019-06-18 MED ORDER — EDARBI 40 MG PO TABS: 1.0000 | ORAL_TABLET | Freq: Every day | ORAL | 0 refills | Status: DC

## 2019-06-18 NOTE — Patient Instructions (Signed)

## 2019-06-18 NOTE — Progress Notes (Signed)
Subjective:  Patient ID: Theresa Harrington, female    DOB: [birth year]  Age: 64 y.o. MRN: [id number]  CC: Hypertension and Diabetes  This visit occurred during the SARS-CoV-2 public health emergency.  Safety protocols were in place, including screening questions prior to the visit, additional usage of staff PPE, and extensive cleaning of exam room while observing appropriate contact time as indicated for disinfecting solutions.    HPI Theresa Harrington presents for she is not doing well on the combination of azilsartan and chlorthalidone.  She complains that it has dropped her blood pressures too low and she has had episodes of fatigue and lightheadedness.  She stopped taking it about 2 days ago and feels better since then.  She is trying to control her blood pressure with losartan but she feels like it is still too high.  She denies any recent episodes of headache, blurred vision, chest pain, shortness of breath, or palpitations.  Outpatient Medications Prior to Visit  Medication Sig Dispense Refill  . acetaminophen (TYLENOL) 650 MG CR tablet Take 1,300 mg by mouth every 8 (eight) hours as needed for pain.    [name] AMBULATORY NON FORMULARY MEDICATION 16m-Viscous lidocaine, 947m10mg/5ml Diyclomine, 27024malox Swish and Swallow 46m2m mouth four times daily. 550 mL 1  . Blood Glucose Monitoring Suppl (ONETOUCH VERIO IQ SYSTEM) w/Device KIT USE TO CHECK SUGAR DAILY 1 kit 0  . CARTIA XT 120 MG 24 hr capsule Take 1 capsule by mouth once daily (Patient taking differently: Take 120 mg by mouth daily. ) 90 capsule 3  . dicyclomine (BENTYL) 10 MG/5ML solution     . dorzolamide (TRUSOPT) 2 % ophthalmic solution Place 1 drop into the right eye 2 (two) times daily.    . dorzolamide-timolol (COSOPT) 22.3-6.8 MG/ML ophthalmic solution INSTILL 1 DROP INTO RIGHT EYE TWICE DAILY    . ezetimibe (ZETIA) 10 MG tablet Take 10 mg by mouth at bedtime.     . glMarland Kitchencose blood test strip Use to check blood sugar twice daily. DX:  E11.8 200 each 3  . insulin aspart (NOVOLOG) 100 UNIT/ML injection Inject 14-20 Units into the skin 3 (three) times daily before meals. (Patient taking differently: Inject 14 Units into the skin 2 (two) times daily with a meal. ) 10 mL 2  . Insulin Glargine (BASAGLAR KWIKPEN) 100 UNIT/ML SOPN Inject 0.6 mLs (60 Units total) into the skin daily. 15 mL 1  . Insulin Pen Needle (PEN NEEDLES) 32G X 4 MM MISC Inject 1 pen as directed 4 (four) times daily. Use to inject levemir or novolog as directed. 400 each 3  . latanoprost (XALATAN) 0.005 % ophthalmic solution Place 1 drop into both eyes at bedtime.     . liraglutide (VICTOZA) 18 MG/3ML SOPN Inject 0.3 mLs (1.8 mg total) into the skin daily. 3 pen 1  . magnesium oxide (MAG-OX) 400 MG tablet Take by mouth.    . metFORMIN (GLUCOPHAGE) 1000 MG tablet TAKE 1 TABLET BY MOUTH TWICE DAILY WITH MEALS 180 tablet 0  . omeprazole (PRILOSEC) 40 MG capsule Take 1 capsule (40 mg total) by mouth 2 (two) times daily before a meal. 90 capsule 2  . potassium chloride SA (K-DUR) 20 MEQ tablet Take 1 tablet (20 mEq total) by mouth 2 (two) times daily. (Patient taking differently: Take 20 mEq by mouth daily. ) 180 tablet 1  . pravastatin (PRAVACHOL) 40 MG tablet Take 1 tablet by mouth once daily 90 tablet 0  . rivaroxaban (XARELTO) 20 MG TABS  tablet Take 1 tablet (20 mg total) by mouth daily. 90 tablet 1  . SHINGRIX injection     . thiamine 100 MG tablet Take 1 tablet (100 mg total) by mouth every other day. 45 tablet 1  . Azilsartan-Chlorthalidone (EDARBYCLOR) 40-25 MG TABS Take 1 tablet by mouth daily. (Patient not taking: Reported on 06/18/2019) 56 tablet 0   No facility-administered medications prior to visit.    ROS Review of Systems  Constitutional: Positive for fatigue. Negative for diaphoresis and unexpected weight change.  HENT: Negative.   Eyes: Negative for visual disturbance.  Respiratory: Negative for cough, chest tightness, shortness of breath and  wheezing.   Cardiovascular: Negative for chest pain, palpitations and leg swelling.  Gastrointestinal: Negative for abdominal pain, diarrhea, nausea and vomiting.  Endocrine: Negative.   Genitourinary: Negative.  Negative for difficulty urinating.  Musculoskeletal: Negative.  Negative for arthralgias and myalgias.  Skin: Negative.  Negative for color change.  Neurological: Positive for light-headedness. Negative for weakness and headaches.  Hematological: Negative for adenopathy. Does not bruise/bleed easily.  Psychiatric/Behavioral: Negative.     Objective:  BP (!) 142/78 (BP Location: Left Arm, Patient Position: Sitting, Cuff Size: Large)   Pulse 64   Temp 97.9 F (36.6 C) (Oral)   Resp 16   Ht '5\' 5"'  (1.651 m)   Wt 183 lb (83 kg)   LMP  (LMP Unknown)   SpO2 99%   BMI 30.45 kg/m   BP Readings from Last 3 Encounters:  06/18/19 (!) 142/78  06/04/19 (!) 182/84  02/26/19 (!) 190/80    Wt Readings from Last 3 Encounters:  06/18/19 183 lb (83 kg)  06/04/19 186 lb (84.4 kg)  02/26/19 175 lb (79.4 kg)    Physical Exam Vitals reviewed.  Constitutional:      Appearance: Normal appearance.  HENT:     Nose: Nose normal.     Mouth/Throat:     Mouth: Mucous membranes are moist.     Pharynx: No oropharyngeal exudate.  Eyes:     General: No scleral icterus.    Conjunctiva/sclera: Conjunctivae normal.  Cardiovascular:     Rate and Rhythm: Normal rate and regular rhythm.     Heart sounds: No murmur.  Pulmonary:     Effort: Pulmonary effort is normal.     Breath sounds: No wheezing, rhonchi or rales.  Abdominal:     General: Abdomen is flat.     Palpations: There is no mass.     Tenderness: There is no abdominal tenderness. There is no guarding.  Musculoskeletal:        General: Normal range of motion.     Cervical back: Neck supple.     Right lower leg: No edema.     Left lower leg: No edema.  Lymphadenopathy:     Cervical: No cervical adenopathy.  Skin:    General:  Skin is warm and dry.     Coloration: Skin is not pale.  Neurological:     General: No focal deficit present.     Mental Status: She is alert.     Lab Results  Component Value Date   WBC 8.8 06/04/2019   HGB 13.3 06/04/2019   HCT 39.6 06/04/2019   PLT 239.0 06/04/2019   GLUCOSE 68 (L) 06/04/2019   CHOL 134 06/04/2019   TRIG 114.0 06/04/2019   HDL 47.90 06/04/2019   LDLDIRECT 160.2 03/01/2010   LDLCALC 64 06/04/2019   ALT 20 10/08/2018   AST 18 10/08/2018  NA 141 06/04/2019   K 3.7 06/04/2019   CL 104 06/04/2019   CREATININE 0.77 06/04/2019   BUN 15 06/04/2019   CO2 29 06/04/2019   TSH 0.42 06/04/2019   INR 1.05 07/16/2014   HGBA1C 7.3 (H) 06/04/2019   MICROALBUR 42.3 (H) 02/26/2019    MM 3D SCREEN BREAST BILATERAL  Result Date: 05/16/2019 CLINICAL DATA:  Screening. EXAM: DIGITAL SCREENING BILATERAL MAMMOGRAM WITH TOMO AND CAD COMPARISON:  Previous exam(s). ACR Breast Density Category b: There are scattered areas of fibroglandular density. FINDINGS: Bilateral postsurgical changes of a mammoplasty are noted. There are no findings suspicious for malignancy. Images were processed with CAD. IMPRESSION: No mammographic evidence of malignancy. A result letter of this screening mammogram will be mailed directly to the patient. RECOMMENDATION: Screening mammogram in one year. (Code:SM-B-01Y) BI-RADS CATEGORY  2: Benign. Electronically Signed   By: Zerita Boers M.D.   On: 05/16/2019 15:29    Assessment & Plan:   Konnie was seen today for hypertension and diabetes.  Diagnoses and all orders for this visit:  Uncontrolled type 2 diabetes mellitus with peripheral artery disease ([place])- Her recent A1c was 7.3%.  She has achieved adequate glycemic control. -     Azilsartan Medoxomil (EDARBI) 40 MG TABS; Take 1 tablet by mouth daily.  Essential hypertension, benign- Her blood pressure was over controlled when she took the combination of azilsartan and chlorthalidone.  I recommended that  she stop taking chlorthalidone and to just take azilsartan.  She will stay on the current dose of the CCB for blood pressure control and rate control. -     Azilsartan Medoxomil (EDARBI) 40 MG TABS; Take 1 tablet by mouth daily.   I have discontinued Nehemie Alperin's Edarbyclor. I am also having her start on Edarbi. Additionally, I am having her maintain her Pen Needles, latanoprost, ezetimibe, potassium chloride SA, OneTouch Verio IQ System, glucose blood, omeprazole, insulin aspart, Cartia XT, dorzolamide, acetaminophen, AMBULATORY NON FORMULARY MEDICATION, Basaglar KwikPen, metFORMIN, pravastatin, dicyclomine, dorzolamide-timolol, magnesium oxide, Shingrix, rivaroxaban, Victoza, and thiamine.  Meds ordered this encounter  Medications  . Azilsartan Medoxomil (EDARBI) 40 MG TABS    Sig: Take 1 tablet by mouth daily.    Dispense:  84 tablet    Refill:  0     Follow-up: Return in about 2 months (around 08/16/2019).  Scarlette Calico, MD

## 2019-06-26 DIAGNOSIS — M21612 Bunion of left foot: Secondary | ICD-10-CM | POA: Diagnosis not present

## 2019-06-26 DIAGNOSIS — E1151 Type 2 diabetes mellitus with diabetic peripheral angiopathy without gangrene: Secondary | ICD-10-CM | POA: Diagnosis not present

## 2019-06-26 DIAGNOSIS — Z8631 Personal history of diabetic foot ulcer: Secondary | ICD-10-CM | POA: Diagnosis not present

## 2019-06-26 DIAGNOSIS — S98132A Complete traumatic amputation of one left lesser toe, initial encounter: Secondary | ICD-10-CM | POA: Diagnosis not present

## 2019-06-26 DIAGNOSIS — M21611 Bunion of right foot: Secondary | ICD-10-CM | POA: Diagnosis not present

## 2019-06-29 ENCOUNTER — Other Ambulatory Visit: Payer: Self-pay | Admitting: Gastroenterology

## 2019-06-30 ENCOUNTER — Telehealth: Payer: Self-pay | Admitting: Gastroenterology

## 2019-06-30 ENCOUNTER — Telehealth: Payer: Self-pay | Admitting: Internal Medicine

## 2019-06-30 NOTE — Telephone Encounter (Signed)
Pharmacy stated pt had already picked up the [place]. Pt stated that she did not and that the pharmacy is suppose to play back the video. Until then, patient is almost out and wanted to know if we have any samples. I informed patient that I will call her around lunch time to let her know.

## 2019-06-30 NOTE — Telephone Encounter (Signed)
Pt states that Dr. Rush Landmark wanted pt to do something to ensure that she does not need another procedure but pt does not remember what she needs to do. Pls call her.

## 2019-06-30 NOTE — Telephone Encounter (Signed)
New message:   Pt states she just needs a call back and it is regarding some medication but would not give any further details.

## 2019-06-30 NOTE — Telephone Encounter (Signed)
Tried to call patient x 2. Line picks up and then drops.

## 2019-06-30 NOTE — Telephone Encounter (Signed)
Dr Rush Landmark looks like the pt is due for procedure per her 01/2019 procedure report.     "Repeat EGD in 3 months for retreatment (liquid diet for 24 hour prior to procedure)"   Do you want me to set her up at this time?

## 2019-07-01 ENCOUNTER — Other Ambulatory Visit: Payer: Self-pay

## 2019-07-01 DIAGNOSIS — K22711 Barrett's esophagus with high grade dysplasia: Secondary | ICD-10-CM

## 2019-07-01 NOTE — Telephone Encounter (Signed)
Please move forward with scheduling. We now have a RFA generator at both [facility] and [facility], so she can be scheduled at either center. Thank you. GM

## 2019-07-01 NOTE — Telephone Encounter (Signed)
EGD scheduled, pt instructed and medications reviewed.  Patient instructions mailed to home.  Patient to call with any questions or concerns. COVID testing also scheduled

## 2019-07-01 NOTE — Telephone Encounter (Signed)
Contacted pt and informed that we did not have any samples of [place].

## 2019-07-03 ENCOUNTER — Ambulatory Visit (INDEPENDENT_AMBULATORY_CARE_PROVIDER_SITE_OTHER): Payer: PPO | Admitting: Internal Medicine

## 2019-07-03 ENCOUNTER — Encounter: Payer: Self-pay | Admitting: Internal Medicine

## 2019-07-03 ENCOUNTER — Other Ambulatory Visit: Payer: Self-pay

## 2019-07-03 VITALS — BP 154/76 | HR 71 | Temp 101.0°F | Resp 16 | Ht 65.0 in | Wt 186.4 lb

## 2019-07-03 DIAGNOSIS — I10 Essential (primary) hypertension: Secondary | ICD-10-CM | POA: Diagnosis not present

## 2019-07-03 DIAGNOSIS — R509 Fever, unspecified: Secondary | ICD-10-CM

## 2019-07-03 DIAGNOSIS — Z124 Encounter for screening for malignant neoplasm of cervix: Secondary | ICD-10-CM

## 2019-07-03 DIAGNOSIS — E1151 Type 2 diabetes mellitus with diabetic peripheral angiopathy without gangrene: Secondary | ICD-10-CM | POA: Diagnosis not present

## 2019-07-03 DIAGNOSIS — E1165 Type 2 diabetes mellitus with hyperglycemia: Secondary | ICD-10-CM | POA: Diagnosis not present

## 2019-07-03 DIAGNOSIS — IMO0002 Reserved for concepts with insufficient information to code with codable children: Secondary | ICD-10-CM

## 2019-07-03 LAB — CBC WITH DIFFERENTIAL/PLATELET
Basophils Absolute: 0 10*3/uL (ref 0.0–0.1)
Basophils Relative: 0.3 % (ref 0.0–3.0)
Eosinophils Absolute: 0 10*3/uL (ref 0.0–0.7)
Eosinophils Relative: 0.1 % (ref 0.0–5.0)
HCT: 34 % — ABNORMAL LOW (ref 36.0–46.0)
Hemoglobin: 11.7 g/dL — ABNORMAL LOW (ref 12.0–15.0)
Lymphocytes Relative: 8.8 % — ABNORMAL LOW (ref 12.0–46.0)
Lymphs Abs: 0.9 10*3/uL (ref 0.7–4.0)
MCHC: 34.3 g/dL (ref 30.0–36.0)
MCV: 87.3 fl (ref 78.0–100.0)
Monocytes Absolute: 0.7 10*3/uL (ref 0.1–1.0)
Monocytes Relative: 6.6 % (ref 3.0–12.0)
Neutro Abs: 8.4 10*3/uL — ABNORMAL HIGH (ref 1.4–7.7)
Neutrophils Relative %: 84.2 % — ABNORMAL HIGH (ref 43.0–77.0)
Platelets: 220 10*3/uL (ref 150.0–400.0)
RBC: 3.9 Mil/uL (ref 3.87–5.11)
RDW: 13.8 % (ref 11.5–15.5)
WBC: 10 10*3/uL (ref 4.0–10.5)

## 2019-07-03 MED ORDER — SYNJARDY XR 12.5-1000 MG PO TB24: 1.0000 | ORAL_TABLET | Freq: Every day | ORAL | 0 refills | Status: DC

## 2019-07-03 NOTE — Patient Instructions (Signed)
Fever, Adult     A fever is an increase in your body's temperature. It often means a temperature of 100.4F (38C) or higher. Brief mild or moderate fevers often have no long-term effects. They often do not need treatment. Moderate or high fevers may make you feel uncomfortable. Sometimes, they can be a sign of a serious illness or disease. A fever that keeps coming back or that lasts a long time may cause you to lose water in your body (get dehydrated). You can take your temperature with a thermometer to see if you have a fever. Temperature can change with:  Age.  Time of day.  Where the thermometer is put in the body. Readings may vary when the thermometer is put: ? In the mouth (oral). ? In the butt (rectal). ? In the ear (tympanic). ? Under the arm (axillary). ? On the forehead (temporal). Follow these instructions at home: Medicines  Take over-the-counter and prescription medicines only as told by your doctor. Follow the dosing instructions carefully.  If you were prescribed an antibiotic medicine, take it as told by your doctor. Do not stop taking it even if you start to feel better. General instructions  Watch for any changes in your symptoms. Tell your doctor about them.  Rest as needed.  Drink enough fluid to keep your pee (urine) pale yellow.  Sponge yourself or bathe with room-temperature water as needed. This helps to lower your body temperature. Do not use ice water.  Do not use too many blankets or wear clothes that are too heavy.  If your fever was caused by an infection that spreads from person to person (is contagious), such as a cold or the flu: ? You should stay home from work and public places for at least 24 hours after your fever is gone. ? Your fever should be gone for at least 24 hours without the need to use medicines. Contact a doctor if:  You throw up (vomit).  You cannot eat or drink without throwing up.  You have watery poop (diarrhea).  It  hurts when you pee.  Your symptoms do not get better with treatment.  You have new symptoms.  You feel very weak. Get help right away if:  You are short of breath or have trouble breathing.  You are dizzy or you pass out (faint).  You feel mixed up (confused).  You have signs of not having enough water in your body, such as: ? Dark pee, very little pee, or no pee. ? Cracked lips. ? Dry mouth. ? Sunken eyes. ? Sleepiness. ? Weakness.  You have very bad pain in your belly (abdomen).  You keep throwing up or having watery poop.  You have a rash on your skin.  Your symptoms get worse all of a sudden. Summary  A fever is an increase in your body's temperature. It often means a temperature of 100.4F (38C) or higher.  Watch for any changes in your symptoms. Tell your doctor about them.  Take all medicines only as told by your doctor.  Do not go to work or other public places if your fever was caused by an illness that can spread to other people.  Get help right away if you have signs that you do not have enough water in your body. This information is not intended to replace advice given to you by your health care provider. Make sure you discuss any questions you have with your health care provider. Document Revised: 10/01/2017   Document Reviewed: 10/01/2017 Elsevier Patient Education  2020 [employer]  

## 2019-07-03 NOTE — Progress Notes (Signed)
Subjective:  Patient ID: Theresa Harrington, female    DOB: [birth year]  Age: 64 y.o. MRN: [id number]  CC: Hypertension and Diabetes  This visit occurred during the SARS-CoV-2 public health emergency.  Safety protocols were in place, including screening questions prior to the visit, additional usage of staff PPE, and extensive cleaning of exam room while observing appropriate contact time as indicated for disinfecting solutions.    HPI Theresa Harrington presents for f/up - She got her first dose of the Moderna Covid vaccine about 24 hours ago.  12 hours after getting the vaccine she developed fever, chills, muscle aches, and fatigue.  She denies cough, sore throat, headache, nausea, vomiting, rash, abdominal pain, diarrhea, dysuria or hematuria.  She complains of weight gain and that her blood pressure is not adequately well controlled.  Outpatient Medications Prior to Visit  Medication Sig Dispense Refill  . acetaminophen (TYLENOL) 650 MG CR tablet Take 1,300 mg by mouth every 8 (eight) hours as needed for pain.    [name] AMBULATORY NON FORMULARY MEDICATION 14m-Viscous lidocaine, 933m10mg/5ml Diyclomine, 27063malox Swish and Swallow 75m32m mouth four times daily. 550 mL 1  . Azilsartan Medoxomil (EDARBI) 40 MG TABS Take 1 tablet by mouth daily. 84 tablet 0  . Blood Glucose Monitoring Suppl (ONETOUCH VERIO IQ SYSTEM) w/Device KIT USE TO CHECK SUGAR DAILY 1 kit 0  . CARTIA XT 120 MG 24 hr capsule Take 1 capsule by mouth once daily (Patient taking differently: Take 120 mg by mouth daily. ) 90 capsule 3  . dicyclomine (BENTYL) 10 MG/5ML solution     . dorzolamide (TRUSOPT) 2 % ophthalmic solution Place 1 drop into the right eye 2 (two) times daily.    . dorzolamide-timolol (COSOPT) 22.3-6.8 MG/ML ophthalmic solution INSTILL 1 DROP INTO RIGHT EYE TWICE DAILY    . ezetimibe (ZETIA) 10 MG tablet Take 10 mg by mouth at bedtime.     . glMarland Kitchencose blood test strip Use to check blood sugar twice daily. DX: E11.8 200 each  3  . insulin aspart (NOVOLOG) 100 UNIT/ML injection Inject 14-20 Units into the skin 3 (three) times daily before meals. (Patient taking differently: Inject 14 Units into the skin 2 (two) times daily with a meal. ) 10 mL 2  . Insulin Glargine (BASAGLAR KWIKPEN) 100 UNIT/ML SOPN Inject 0.6 mLs (60 Units total) into the skin daily. 15 mL 1  . Insulin Pen Needle (PEN NEEDLES) 32G X 4 MM MISC Inject 1 pen as directed 4 (four) times daily. Use to inject levemir or novolog as directed. 400 each 3  . latanoprost (XALATAN) 0.005 % ophthalmic solution Place 1 drop into both eyes at bedtime.     . liraglutide (VICTOZA) 18 MG/3ML SOPN Inject 0.3 mLs (1.8 mg total) into the skin daily. 3 pen 1  . magnesium oxide (MAG-OX) 400 MG tablet Take by mouth.    . omMarland Kitchenprazole (PRILOSEC) 40 MG capsule TAKE 1 CAPSULE BY MOUTH TWICE DAILY BEFORE A MEAL 90 capsule 0  . potassium chloride SA (K-DUR) 20 MEQ tablet Take 1 tablet (20 mEq total) by mouth 2 (two) times daily. (Patient taking differently: Take 20 mEq by mouth daily. ) 180 tablet 1  . pravastatin (PRAVACHOL) 40 MG tablet Take 1 tablet by mouth once daily 90 tablet 0  . rivaroxaban (XARELTO) 20 MG TABS tablet Take 1 tablet (20 mg total) by mouth daily. 90 tablet 1  . thiamine 100 MG tablet Take 1 tablet (100 mg total) by mouth every  other day. 45 tablet 1  . metFORMIN (GLUCOPHAGE) 1000 MG tablet TAKE 1 TABLET BY MOUTH TWICE DAILY WITH MEALS 180 tablet 0  . SHINGRIX injection      No facility-administered medications prior to visit.    ROS Review of Systems  Constitutional: Positive for chills, fatigue, fever and unexpected weight change (wt gain). Negative for appetite change and diaphoresis.  HENT: Negative.  Negative for sore throat and trouble swallowing.   Eyes: Negative for visual disturbance.  Respiratory: Negative for cough, chest tightness, shortness of breath and wheezing.   Cardiovascular: Negative for chest pain, palpitations and leg swelling.    Gastrointestinal: Negative for abdominal pain, constipation, diarrhea, nausea and vomiting.  Endocrine: Negative.   Genitourinary: Negative.  Negative for decreased urine volume, difficulty urinating, dysuria, frequency, hematuria and urgency.  Musculoskeletal: Negative.  Negative for arthralgias, joint swelling and myalgias.  Skin: Negative.  Negative for color change and rash.  Neurological: Negative.  Negative for dizziness, weakness, light-headedness and numbness.  Hematological: Negative for adenopathy. Does not bruise/bleed easily.  Psychiatric/Behavioral: Negative.     Objective:  BP (!) 154/76 (BP Location: Right Arm, Patient Position: Sitting, Cuff Size: Normal)   Pulse 71   Temp (!) 101 F (38.3 C) (Oral)   Resp 16   Ht '5\' 5"'  (1.651 m)   Wt 186 lb 6 oz (84.5 kg)   LMP  (LMP Unknown)   SpO2 97%   BMI 31.01 kg/m   BP Readings from Last 3 Encounters:  07/03/19 (!) 154/76  06/18/19 (!) 142/78  06/04/19 (!) 182/84    Wt Readings from Last 3 Encounters:  07/03/19 186 lb 6 oz (84.5 kg)  06/18/19 183 lb (83 kg)  06/04/19 186 lb (84.4 kg)    Physical Exam Vitals reviewed.  Constitutional:      General: She is not in acute distress.    Appearance: Normal appearance. She is not ill-appearing, toxic-appearing or diaphoretic.  HENT:     Nose: Nose normal.     Mouth/Throat:     Mouth: Mucous membranes are moist.     Pharynx: No oropharyngeal exudate or posterior oropharyngeal erythema.  Eyes:     General: No scleral icterus.    Conjunctiva/sclera: Conjunctivae normal.  Cardiovascular:     Rate and Rhythm: Normal rate and regular rhythm.     Pulses: Normal pulses.     Heart sounds: No murmur.  Pulmonary:     Effort: Pulmonary effort is normal.     Breath sounds: No stridor. No wheezing, rhonchi or rales.  Abdominal:     General: Abdomen is protuberant. Bowel sounds are normal. There is no distension.     Palpations: Abdomen is soft. There is no hepatomegaly or  splenomegaly.     Tenderness: There is no abdominal tenderness.  Musculoskeletal:        General: Normal range of motion.     Cervical back: Neck supple. No tenderness.     Right lower leg: No edema.     Left lower leg: No edema.  Lymphadenopathy:     Cervical: No cervical adenopathy.  Skin:    General: Skin is warm and dry.     Coloration: Skin is not pale.     Findings: No rash.  Neurological:     General: No focal deficit present.     Mental Status: She is alert.  Psychiatric:        Mood and Affect: Mood normal.  Behavior: Behavior normal.     Lab Results  Component Value Date   WBC 10.0 07/03/2019   HGB 11.7 (L) 07/03/2019   HCT 34.0 (L) 07/03/2019   PLT 220.0 07/03/2019   GLUCOSE 68 (L) 06/04/2019   CHOL 134 06/04/2019   TRIG 114.0 06/04/2019   HDL 47.90 06/04/2019   LDLDIRECT 160.2 03/01/2010   LDLCALC 64 06/04/2019   ALT 20 10/08/2018   AST 18 10/08/2018   NA 141 06/04/2019   K 3.7 06/04/2019   CL 104 06/04/2019   CREATININE 0.77 06/04/2019   BUN 15 06/04/2019   CO2 29 06/04/2019   TSH 0.42 06/04/2019   INR 1.05 07/16/2014   HGBA1C 7.3 (H) 06/04/2019   MICROALBUR 42.3 (H) 02/26/2019    MM 3D SCREEN BREAST BILATERAL  Result Date: 05/16/2019 CLINICAL DATA:  Screening. EXAM: DIGITAL SCREENING BILATERAL MAMMOGRAM WITH TOMO AND CAD COMPARISON:  Previous exam(s). ACR Breast Density Category b: There are scattered areas of fibroglandular density. FINDINGS: Bilateral postsurgical changes of a mammoplasty are noted. There are no findings suspicious for malignancy. Images were processed with CAD. IMPRESSION: No mammographic evidence of malignancy. A result letter of this screening mammogram will be mailed directly to the patient. RECOMMENDATION: Screening mammogram in one year. (Code:SM-B-01Y) BI-RADS CATEGORY  2: Benign. Electronically Signed   By: Zerita Boers M.D.   On: 05/16/2019 15:29    Assessment & Plan:   Kamren was seen today for hypertension and  diabetes.  Diagnoses and all orders for this visit:  Uncontrolled type 2 diabetes mellitus with peripheral artery disease ([place])- Her A1c is at 7.3%.  She complains of weight gain.  I recommended that she add an SGLT2 inhibitor to the other hypoglycemic agents. -     Empagliflozin-metFORMIN HCl ER (SYNJARDY XR) 12.08-998 MG TB24; Take 1 tablet by mouth daily.  Fever and chills- She developed the symptoms 12 hours after receiving a COVID-19 vaccine.  She does not appear toxic and there are no localizing symptoms or physical findings.  Her white cell count is 10 and she has a mild increase in neutrophils.  I do not see any evidence of infection.  I think the symptoms are related to the COVID-19 vaccine.  She was informed of this. -     CBC with Differential/Platelet  Cervical cancer screening -     Ambulatory referral to Gynecology  Essential hypertension, benign- She has not quite achieved her blood pressure goal of 130/80.  I recommended that she add an SGLT2 inhibitor to her diabetic regimen and I think that this may help her achieve her blood pressure goal.  Screening for cervical cancer   I have discontinued Lexine Camps's metFORMIN and Shingrix. I am also having her start on Synjardy XR. Additionally, I am having her maintain her Pen Needles, latanoprost, ezetimibe, potassium chloride SA, OneTouch Verio IQ System, glucose blood, insulin aspart, Cartia XT, dorzolamide, acetaminophen, AMBULATORY NON FORMULARY MEDICATION, Basaglar KwikPen, pravastatin, dicyclomine, dorzolamide-timolol, magnesium oxide, rivaroxaban, Victoza, thiamine, Edarbi, and omeprazole.  Meds ordered this encounter  Medications  . Empagliflozin-metFORMIN HCl ER (SYNJARDY XR) 12.08-998 MG TB24    Sig: Take 1 tablet by mouth daily.    Dispense:  140 tablet    Refill:  0     Follow-up: Return in about 3 months (around 10/03/2019).  Scarlette Calico, MD

## 2019-07-09 ENCOUNTER — Ambulatory Visit: Payer: PPO | Admitting: Obstetrics and Gynecology

## 2019-07-16 ENCOUNTER — Other Ambulatory Visit: Payer: Self-pay | Admitting: Internal Medicine

## 2019-07-16 DIAGNOSIS — T502X5A Adverse effect of carbonic-anhydrase inhibitors, benzothiadiazides and other diuretics, initial encounter: Secondary | ICD-10-CM

## 2019-07-16 DIAGNOSIS — E876 Hypokalemia: Secondary | ICD-10-CM

## 2019-07-30 ENCOUNTER — Ambulatory Visit (INDEPENDENT_AMBULATORY_CARE_PROVIDER_SITE_OTHER): Payer: PPO

## 2019-07-30 DIAGNOSIS — Z1159 Encounter for screening for other viral diseases: Secondary | ICD-10-CM

## 2019-07-31 ENCOUNTER — Other Ambulatory Visit (HOSPITAL_COMMUNITY)
Admission: RE | Admit: 2019-07-31 | Discharge: 2019-07-31 | Disposition: A | Discharge status: Home / Self Care | Payer: PPO | Source: Ambulatory Visit | Attending: Gastroenterology | Admitting: Gastroenterology

## 2019-07-31 ENCOUNTER — Other Ambulatory Visit: Payer: Self-pay

## 2019-07-31 ENCOUNTER — Encounter (HOSPITAL_COMMUNITY): Payer: Self-pay | Admitting: Gastroenterology

## 2019-07-31 DIAGNOSIS — Z20822 Contact with and (suspected) exposure to covid-19: Secondary | ICD-10-CM | POA: Insufficient documentation

## 2019-07-31 DIAGNOSIS — Z01812 Encounter for preprocedural laboratory examination: Secondary | ICD-10-CM | POA: Insufficient documentation

## 2019-07-31 LAB — SARS CORONAVIRUS 2 (TAT 6-24 HRS): SARS Coronavirus 2: NEGATIVE

## 2019-07-31 NOTE — Progress Notes (Signed)
Theresa Harrington denies chest pain or shortness of breath.  Patient was testes for Covid today and is in quarantine at home.  Theresa Harrington has a history of PAF, patient denies palpations ,she was instructed to stop Xarelto 2 days prior to procedure.  Theresa Harrington has type II diabetes, she reports that fasting CBG's run 108 - 110.  I instructed patient to hold Synjardy,  Sunday and Monday morning,  Hold Victozia Monday am; Sunday at bedtime take 1/2 of Basglar Insulin - will take 30 units.I instructed patient to check CBG after awaking and every 2 hours until arrival  to the hospital.  I Instructed patient if CBG is less than 70 to take 4 Glucose Tablets or 1 tube of Glucose Gel or 1/2 cup of a clear juice. Recheck CBG in 15 minutes then call pre- op desk at [phone] for further instructions. Theresa Harrington thinks she may have glucose gel or tabs.

## 2019-08-03 NOTE — Anesthesia Preprocedure Evaluation (Addendum)
Anesthesia Evaluation  Patient identified by MRN, date of birth, ID band Patient awake    Reviewed: Allergy & Precautions, NPO status , Patient's Chart, lab work & pertinent test results  History of Anesthesia Complications (+) PONV and history of anesthetic complications  Airway Mallampati: II  TM Distance: >3 FB Neck ROM: Full    Dental no notable dental hx. (+) Teeth Intact, Dental Advisory Given   Pulmonary former smoker,  Quit smoking 2003, 28 pack year history    Pulmonary exam normal breath sounds clear to auscultation       Cardiovascular hypertension, Pt. on medications + Peripheral Vascular Disease  Normal cardiovascular exam+ dysrhythmias Atrial Fibrillation + Valvular Problems/Murmurs (mild MS)  Rhythm:Regular Rate:Normal  HLD  Last echo 2018: LVEF 55-60%, normal wall thickness, normal wall motion, moderateMAC with mild stenosis and trivial MR, severe LAE, mild RAE, normal IVC  PAF- on xarelto, last dose on 08/01/19   Neuro/Psych PSYCHIATRIC DISORDERS Anxiety    GI/Hepatic Neg liver ROS, GERD  Medicated and Controlled,barretts esophagus   Endo/Other  diabetes, Type 2, Oral Hypoglycemic Agents, Insulin DependentHypothyroidism   Renal/GU negative Renal ROS  negative genitourinary   Musculoskeletal  (+) Arthritis , Osteoarthritis,    Abdominal Normal abdominal exam  (+)   Peds  Hematology  (+) Blood dyscrasia, anemia ,   Anesthesia Other Findings   Reproductive/Obstetrics negative OB ROS                            Anesthesia Physical Anesthesia Plan  ASA: III  Anesthesia Plan: General   Post-op Pain Management:    Induction: Intravenous  PONV Risk Score and Plan: 4 or greater and TIVA, Propofol infusion, Ondansetron, Dexamethasone and Scopolamine patch - Pre-op  Airway Management Planned: Oral ETT  Additional Equipment: None  Intra-op Plan:   Post-operative Plan:  Extubation in OR  Informed Consent: I have reviewed the patients History and Physical, chart, labs and discussed the procedure including the risks, benefits and alternatives for the proposed anesthesia with the patient or authorized representative who has indicated his/her understanding and acceptance.     Dental advisory given  Plan Discussed with: CRNA  Anesthesia Plan Comments:        Anesthesia Quick Evaluation

## 2019-08-04 ENCOUNTER — Ambulatory Visit (HOSPITAL_COMMUNITY): Payer: PPO | Admitting: Anesthesiology

## 2019-08-04 ENCOUNTER — Encounter (HOSPITAL_COMMUNITY)
Admission: RE | Disposition: A | Discharge status: Home / Self Care | Payer: Self-pay | Source: Ambulatory Visit | Attending: Gastroenterology

## 2019-08-04 ENCOUNTER — Ambulatory Visit (HOSPITAL_COMMUNITY)
Admission: RE | Admit: 2019-08-04 | Discharge: 2019-08-04 | Disposition: A | Discharge status: Home / Self Care | Payer: PPO | Source: Ambulatory Visit | Attending: Gastroenterology | Admitting: Gastroenterology

## 2019-08-04 ENCOUNTER — Other Ambulatory Visit: Payer: Self-pay

## 2019-08-04 ENCOUNTER — Encounter (HOSPITAL_COMMUNITY): Payer: Self-pay | Admitting: Gastroenterology

## 2019-08-04 DIAGNOSIS — I10 Essential (primary) hypertension: Secondary | ICD-10-CM | POA: Diagnosis not present

## 2019-08-04 DIAGNOSIS — E039 Hypothyroidism, unspecified: Secondary | ICD-10-CM | POA: Diagnosis not present

## 2019-08-04 DIAGNOSIS — E1151 Type 2 diabetes mellitus with diabetic peripheral angiopathy without gangrene: Secondary | ICD-10-CM | POA: Diagnosis not present

## 2019-08-04 DIAGNOSIS — K3189 Other diseases of stomach and duodenum: Secondary | ICD-10-CM | POA: Diagnosis not present

## 2019-08-04 DIAGNOSIS — Z833 Family history of diabetes mellitus: Secondary | ICD-10-CM | POA: Insufficient documentation

## 2019-08-04 DIAGNOSIS — K228 Other specified diseases of esophagus: Secondary | ICD-10-CM

## 2019-08-04 DIAGNOSIS — K449 Diaphragmatic hernia without obstruction or gangrene: Secondary | ICD-10-CM | POA: Diagnosis not present

## 2019-08-04 DIAGNOSIS — K2270 Barrett's esophagus without dysplasia: Secondary | ICD-10-CM | POA: Diagnosis not present

## 2019-08-04 DIAGNOSIS — K2100 Gastro-esophageal reflux disease with esophagitis, without bleeding: Secondary | ICD-10-CM | POA: Insufficient documentation

## 2019-08-04 DIAGNOSIS — I480 Paroxysmal atrial fibrillation: Secondary | ICD-10-CM | POA: Insufficient documentation

## 2019-08-04 DIAGNOSIS — M19041 Primary osteoarthritis, right hand: Secondary | ICD-10-CM | POA: Diagnosis not present

## 2019-08-04 DIAGNOSIS — K22711 Barrett's esophagus with high grade dysplasia: Secondary | ICD-10-CM

## 2019-08-04 DIAGNOSIS — Z8249 Family history of ischemic heart disease and other diseases of the circulatory system: Secondary | ICD-10-CM | POA: Insufficient documentation

## 2019-08-04 DIAGNOSIS — Z888 Allergy status to other drugs, medicaments and biological substances status: Secondary | ICD-10-CM | POA: Diagnosis not present

## 2019-08-04 DIAGNOSIS — Z87891 Personal history of nicotine dependence: Secondary | ICD-10-CM | POA: Insufficient documentation

## 2019-08-04 DIAGNOSIS — E1136 Type 2 diabetes mellitus with diabetic cataract: Secondary | ICD-10-CM | POA: Insufficient documentation

## 2019-08-04 DIAGNOSIS — Z885 Allergy status to narcotic agent status: Secondary | ICD-10-CM | POA: Insufficient documentation

## 2019-08-04 DIAGNOSIS — Z8261 Family history of arthritis: Secondary | ICD-10-CM | POA: Insufficient documentation

## 2019-08-04 DIAGNOSIS — Z880 Allergy status to penicillin: Secondary | ICD-10-CM | POA: Diagnosis not present

## 2019-08-04 DIAGNOSIS — D509 Iron deficiency anemia, unspecified: Secondary | ICD-10-CM | POA: Diagnosis not present

## 2019-08-04 DIAGNOSIS — Z8719 Personal history of other diseases of the digestive system: Secondary | ICD-10-CM | POA: Insufficient documentation

## 2019-08-04 HISTORY — DX: Unspecified osteoarthritis, unspecified site: M19.90

## 2019-08-04 HISTORY — PX: GI RADIOFREQUENCY ABLATION: SHX6807

## 2019-08-04 HISTORY — PX: BIOPSY: SHX5522

## 2019-08-04 HISTORY — PX: ESOPHAGOGASTRODUODENOSCOPY (EGD) WITH PROPOFOL: SHX5813

## 2019-08-04 LAB — GLUCOSE, CAPILLARY
Glucose-Capillary: 86 mg/dL (ref 70–99)
Glucose-Capillary: 108 mg/dL — ABNORMAL HIGH (ref 70–99)

## 2019-08-04 SURGERY — ESOPHAGOGASTRODUODENOSCOPY (EGD) WITH PROPOFOL
Anesthesia: General

## 2019-08-04 MED ORDER — HYDROCODONE-ACETAMINOPHEN 7.5-325 MG/15ML PO SOLN: 10.0000 mL | Freq: Four times a day (QID) | ORAL | 0 refills | Status: DC | PRN

## 2019-08-04 MED ORDER — AMBULATORY NON FORMULARY MEDICATION: 1 refills | Status: DC

## 2019-08-04 MED ORDER — PROPOFOL 10 MG/ML IV BOLUS
INTRAVENOUS | Status: DC | PRN
  Administered 2019-08-04: 200 mg via INTRAVENOUS

## 2019-08-04 MED ORDER — DEXAMETHASONE SODIUM PHOSPHATE 10 MG/ML IJ SOLN
INTRAMUSCULAR | Status: DC | PRN
  Administered 2019-08-04: 4 mg via INTRAVENOUS

## 2019-08-04 MED ORDER — ONDANSETRON HCL 4 MG/2ML IJ SOLN
INTRAMUSCULAR | Status: DC | PRN
  Administered 2019-08-04: 4 mg via INTRAVENOUS

## 2019-08-04 MED ORDER — ACETYLCYSTEINE 20 % IN SOLN
4.0000 mL | Freq: Once | RESPIRATORY_TRACT | Status: DC
  Filled 2019-08-04: qty 4

## 2019-08-04 MED ORDER — LIDOCAINE 2% (20 MG/ML) 5 ML SYRINGE
INTRAMUSCULAR | Status: DC | PRN
  Administered 2019-08-04: 60 mg via INTRAVENOUS

## 2019-08-04 MED ORDER — SUCCINYLCHOLINE CHLORIDE 200 MG/10ML IV SOSY
PREFILLED_SYRINGE | INTRAVENOUS | Status: DC | PRN
  Administered 2019-08-04: 100 mg via INTRAVENOUS

## 2019-08-04 MED ORDER — SUCRALFATE 1 GM/10ML PO SUSP: 1.0000 g | Freq: Four times a day (QID) | ORAL | 1 refills | Status: DC

## 2019-08-04 MED ORDER — LACTATED RINGERS IV SOLN: INTRAVENOUS | Status: DC

## 2019-08-04 MED ORDER — SCOPOLAMINE 1 MG/3DAYS TD PT72
MEDICATED_PATCH | TRANSDERMAL | Status: DC | PRN
  Administered 2019-08-04: 1 via TRANSDERMAL

## 2019-08-04 MED ORDER — SCOPOLAMINE 1 MG/3DAYS TD PT72
MEDICATED_PATCH | TRANSDERMAL | Status: AC
  Filled 2019-08-04: qty 1

## 2019-08-04 MED ORDER — SODIUM CHLORIDE 0.9 % IV SOLN: INTRAVENOUS | Status: DC

## 2019-08-04 MED ORDER — STERILE WATER FOR INJECTION IJ SOLN
RESPIRATORY_TRACT | Status: DC | PRN
  Administered 2019-08-04: 10:00:00 76 mL via OROMUCOSAL

## 2019-08-04 MED ORDER — PROPOFOL 500 MG/50ML IV EMUL
INTRAVENOUS | Status: DC | PRN
  Administered 2019-08-04: 150 ug/kg/min via INTRAVENOUS

## 2019-08-04 MED ORDER — EPHEDRINE SULFATE 50 MG/ML IJ SOLN
INTRAMUSCULAR | Status: DC | PRN
  Administered 2019-08-04 (×3): 5 mg via INTRAVENOUS

## 2019-08-04 SURGICAL SUPPLY — 15 items

## 2019-08-04 NOTE — Transfer of Care (Signed)
Immediate Anesthesia Transfer of Care Note  Patient: Theresa Harrington  Procedure(s) Performed: ESOPHAGOGASTRODUODENOSCOPY (EGD) WITH PROPOFOL (N/A ) GI RADIOFREQUENCY ABLATION (N/A ) BIOPSY  Patient Location: PACU  Anesthesia Type:General  Level of Consciousness: drowsy  Airway & Oxygen Therapy: Patient Spontanous Breathing  Post-op Assessment: Report given to RN and Post -op Vital signs reviewed and stable  Post vital signs: Reviewed and stable  Last Vitals:  Vitals Value Taken Time  BP    Temp    Pulse 49 08/04/19 1017  Resp 20 08/04/19 1017  SpO2 96 % 08/04/19 1017  Vitals shown include unvalidated device data.  Last Pain:  Vitals:   08/04/19 0805  TempSrc: Oral  PainSc: 0-No pain         Complications: No apparent anesthesia complications

## 2019-08-04 NOTE — Anesthesia Postprocedure Evaluation (Signed)
Anesthesia Post Note  Patient: Theresa Harrington  Procedure(s) Performed: ESOPHAGOGASTRODUODENOSCOPY (EGD) WITH PROPOFOL (N/A ) GI RADIOFREQUENCY ABLATION (N/A ) BIOPSY     Patient location during evaluation: PACU Anesthesia Type: General Level of consciousness: awake and alert, oriented and patient cooperative Pain management: pain level controlled Vital Signs Assessment: post-procedure vital signs reviewed and stable Respiratory status: spontaneous breathing, nonlabored ventilation and respiratory function stable Cardiovascular status: blood pressure returned to baseline and stable Postop Assessment: no apparent nausea or vomiting Anesthetic complications: no    Last Vitals:  Vitals:   08/04/19 1034 08/04/19 1049  BP: (!) 156/58 (!) 169/61  Pulse: (!) 49 (!) 50  Resp: 16 19  Temp:  (!) 36.3 C  SpO2: 98% 97%    Last Pain:  Vitals:   08/04/19 1049  TempSrc:   PainSc: 0-No pain                 Jarome Matin Taisha Pennebaker

## 2019-08-04 NOTE — Op Note (Signed)
[facility] Patient Name: Theresa Harrington Procedure Date : 08/04/2019 MRN: [id number] Attending MD: Justice Britain , MD Date of Birth: Dec 26, 1955 CSN: [id number] Age: 64 Admit Type: Outpatient Procedure:                Upper GI endoscopy Indications:              Barrett's esophagus, For therapy of Barrett's                            esophagus, Follow-up of previous ablation treatment                            of Barrett's esophagus Providers:                Justice Britain, MD, Carlyn Reichert, RN, Laverda Sorenson, Technician, Lerry Paterson, CRNA Referring MD:             Estill Cotta. Loletha Carrow, MD, [name], MD Medicines:                General Anesthesia Complications:            No immediate complications. Estimated Blood Loss:     Estimated blood loss was minimal. Procedure:                Pre-Anesthesia Assessment:                           - Prior to the procedure, a History and Physical                            was performed, and patient medications and                            allergies were reviewed. The patient's tolerance of                            previous anesthesia was also reviewed. The risks                            and benefits of the procedure and the sedation                            options and risks were discussed with the patient.                            All questions were answered, and informed consent                            was obtained. Prior Anticoagulants: The patient has                            taken Xarelto (rivaroxaban), last dose was 2 days  prior to procedure. ASA Grade Assessment: II - A                            patient with mild systemic disease. After reviewing                            the risks and benefits, the patient was deemed in                            satisfactory condition to undergo the procedure.                           After obtaining informed  consent, the endoscope was                            passed under direct vision. Throughout the                            procedure, the patient's blood pressure, pulse, and                            oxygen saturations were monitored continuously. The                            GIF-H190 ([id number]) Olympus gastroscope was                            introduced through the mouth, and advanced to the                            second part of duodenum. The upper GI endoscopy was                            accomplished without difficulty. The patient                            tolerated the procedure. Scope In: Scope Out: Findings:      No gross lesions were noted in the proximal esophagus and in the mid       esophagus.      Patchy moderate mucosal changes characterized by discoloration and       altered texture were found in the distal esophagus from 31 cm to 33 cm -       I have previously described this area as esophagitis. It looks improved       somewhat but under NBI, the region could be hiding Barrett's. The mucosa       was initially biopsied. Then it was irrigated with N-acetylcysteine       (Mucomyst) 1% mixed with water. Esophageal contents were suctioned. The       endoscope was then removed from the patient. The Barrx-60 radiofrequency       ablation catheter was attached to the tip of the endoscope. The       endoscope with the attached radiofrequency ablation catheter was then       passed transorally under direct vision  into the esophagus and advanced       to this area. The radiofrequency ablation catheter was placed in contact       with the surface mucosa under direct visualization and energy was       applied twice at 12 J/cm2. Ablation was repeated in a likewise fashion       to treat the entire area. The ablation zone was cleaned of coagulative       debris. The ablation catheter and endoscope were then removed and the       catheter was cleaned. The catheter and  endoscope were reinserted into       the esophagus. A second round of ablation was then performed. Energy was       applied twice at 12 J/cm2 to retreat the area that had been treated with       the first series of ablation. The areas of the esophagus where the       abnormal tissue mucosa had been ablated were examined. Whitish changes       of ablated mucosa were present. The total number of energy applications       for all mucosal sites treated was 26.      In the region of the previously noted Barrett's, we are seeing clinical       and endoscopic improvement. The esophagus and gastroesophageal junction       were examined with white light and narrow band imaging (NBI) from a       forward view and retroflexed position. There were esophageal mucosal       changes consistent with short-segment Barrett's esophagus. These changes       involved the mucosa at the upper extent of the gastric folds (37 cm from       the incisors) extending to the Z-line (37 cm from the incisors). One       tongue of salmon-colored mucosa was present from 36 to 37 cm and       scattered islands of salmon-colored mucosa were present from 36 to 37       cm. This is improved from our initial outset of the Ablation protocol.       The maximum longitudinal extent of these esophageal mucosal changes was       1 cm in length. Focal radiofrequency ablation of Barrett's esophagus was       performed. With the endoscope in place, the position and extent of the       Barrett's mucosa and the anatomic landmarks including proximal and       distal extent of Barrett's mucosa, top of gastric folds and crural pinch       were noted. Endoscopic visualization identified an ablation site       including the entire visible Barrett's segment. The Barrett's mucosa was       irrigated with N-acetylcysteine (Mucomyst) 1% mixed with water.       Esophageal contents were suctioned. The endoscope was then removed from       the  patient. The Barrx-60 radiofrequency ablation catheter was attached       to the tip of the endoscope. The endoscope with the attached       radiofrequency ablation catheter was then passed transorally under       direct vision into the esophagus and advanced to the areas of Barrett's       mucosa. The areas included islands and  tongues of Barrett's mucosa. The       radiofrequency ablation catheter was placed in contact with the surface       of the Barrett's mucosa under direct visualization and energy was       applied twice at 12 J/cm2. Ablation was repeated in a likewise fashion       to treat the entire area of suspected Barrett's mucosa. The ablation       zone was cleaned of coagulative debris. The ablation catheter and       endoscope were then removed and the catheter was cleaned. The catheter       and endoscope were reinserted into the esophagus. A second round of       ablation was then performed. Energy was applied twice at 12 J/cm2 to       retreat the areas of Barrett's epithelium that had been treated with the       first series of ablation. The areas of the esophagus where Barrett's       mucosa had been ablated were examined. Areas of Barrett's esophagus were       completely ablated. The total number of energy applications for all       mucosal sites treated was 26.      A 4 cm hiatal hernia was present.      Striped mildly erythematous mucosa without bleeding was found in the       gastric antrum.      No other gross lesions were noted in the entire examined stomach.      No gross lesions were noted in the duodenal bulb, in the first portion       of the duodenum and in the second portion of the duodenum. Impression:               - No gross lesions in esophagus.                           - Previously described esophagitis is still noted,                            though partially improved. Under NBI could be                            underlying Barrett's vs  Reparative changes (last                            biopsy of this area was in [place]). The area                            was discolored, texture changed mucosa in the                            esophagus. Biopsied. Then went ahead and treated                            with radiofrequency ablation.                           - Esophageal mucosal changes consistent with  short-segment Barrett's esophagus - this is                            significantly improved from prior with only a short                            segment currently present. Treated with                            radiofrequency ablation.                           - 4 cm hiatal hernia.                           - Erythematous mucosa in the antrum. No other gross                            lesions in the stomach.                           - No gross lesions in the duodenal bulb, in the                            first portion of the duodenum and in the second                            portion of the duodenum. Recommendation:           - The patient will be observed post-procedure,                            until all discharge criteria are met.                           - Discharge patient to home.                           - Patient has a contact number available for                            emergencies. The signs and symptoms of potential                            delayed complications were discussed with the                            patient. Return to normal activities tomorrow.                            Written discharge instructions were provided to the                            patient.                           -  Clear liquid diet for 24 hours. Then, full liquid                            diet for 72 hours. Then, soft diet for 1 week                            thereafter. Then advance your diet thereafter to                            regular.                           -  Continue your PPI 40 mg twice daily.                           - You will be prescribed a lidocaine/antiacid                            mixture that you can take up to 4 times per day as                            needed (prescription has been sent to your                            pharmacy) - take no matter what for at least first                            3-days - A separate prescription will be sent to                            the pharamcy.                           - You will be prescribed liquid Tylenol with                            hydrocodone that you can use up to 4 times daily                            for the next 72 hours if needed (prescription to be                            sent to your pharmacy) - I recommend using this                            even if you do not have pain for the first 2-3 days                            (I have placed a prescription myself to the [facility]).                           -  You will be prescribed Sucralfate Suspension and                            you will take this 4 times daily (make sure you are                            not taking any other medication 1 hour before or 1                            hour after this medication is taken) to allow                            coating and healing of your esophagus - this needs                            to be taken for at least 45-month(you should have                            refills available but call if you run out).                           - Await pathology results.                           - Repeat upper endoscopy in 3 months for                            surveillance and based on final pathology results.                           - The findings and recommendations were discussed                            with the patient. Procedure Code(s):        --- Professional ---                           [id number] Esophagogastroduodenoscopy, flexible,                             transoral; with ablation of tumor(s), polyp(s), or                            other lesion(s) (includes pre- and post-dilation                            and guide wire passage, when performed) Diagnosis Code(s):        --- Professional ---                           K22.70, Barrett's esophagus without dysplasia                           K22.8, Other specified  diseases of esophagus                           K31.89, Other diseases of stomach and duodenum                           K44.9, Diaphragmatic hernia without obstruction or                            gangrene                           Z09, Encounter for follow-up examination after                            completed treatment for conditions other than                            malignant neoplasm CPT copyright 2019 [employer] Association. All rights reserved. The codes documented in this report are preliminary and upon coder review may  be revised to meet current compliance requirements. Justice Britain, MD 08/04/2019 10:24:46 AM Number of Addenda: 0

## 2019-08-04 NOTE — Anesthesia Procedure Notes (Signed)
Procedure Name: Intubation Performed by: Milford Cage, CRNA Pre-anesthesia Checklist: Patient identified, Emergency Drugs available, Suction available and Patient being monitored Patient Re-evaluated:Patient Re-evaluated prior to induction Oxygen Delivery Method: Circle System Utilized Preoxygenation: Pre-oxygenation with 100% oxygen Induction Type: IV induction Ventilation: Mask ventilation without difficulty Laryngoscope Size: Mac and 3 Grade View: Grade II Tube type: Oral Tube size: 7.0 mm Number of attempts: 1 Airway Equipment and Method: Stylet Placement Confirmation: ETT inserted through vocal cords under direct vision,  positive ETCO2 and breath sounds checked- equal and bilateral Secured at: 22 cm Tube secured with: Tape Dental Injury: Teeth and Oropharynx as per pre-operative assessment

## 2019-08-04 NOTE — H&P (Signed)
GASTROENTEROLOGY PROCEDURE H&P NOTE   Primary Care Physician: [name], MD  HPI: Theresa Harrington is a 64 y.o. female who presents for EGD with possible RFA of Barrett's esophagus per protocol.  Past Medical History:  Diagnosis Date  . Allergy   . Anxiety    Pt. denies  . Arthritis    right index figer  . Asymptomatic cholelithiasis   . Atherosclerosis of aorta ([place])   . Cataract   . Clotting disorder ([place])   . Gallstones 06/2013  . GERD (gastroesophageal reflux disease)   . History of kidney stones    lithotrispy  . Hyperlipidemia   . Hypertension   . Hypothyroidism    no meds  . Iron deficiency anemia   . PAD (peripheral artery disease) ([place])   . PAF (paroxysmal atrial fibrillation) ([place])   . Peripheral arterial occlusive disease (HCC)    lower extremities  . Pneumonia   . PONV (postoperative nausea and vomiting)   . Right ureteral stone   . Type 2 diabetes mellitus (HCC)    Type  . Vitamin B 12 deficiency   . Wears glasses    Past Surgical History:  Procedure Laterality Date  . AMPUTATION Left 07/16/2014   Procedure: LEFT SECOND TOE AMPUTATION;  Surgeon: Serafina Mitchell, MD;  Location: [facility];  Service: Vascular;  Laterality: Left;  With Nerve block  . AUGMENTATION MAMMAPLASTY    . BELPHAROPTOSIS REPAIR     eyelid lift  . BIOPSY  10/14/2018   Procedure: BIOPSY;  Surgeon: Rush Landmark Telford Nab., MD;  Location: [facility];  Service: Gastroenterology;;  . COLONOSCOPY    . COMBINED AUGMENTATION MAMMAPLASTY AND ABDOMINOPLASTY  2009   W/  BILATERAL  THIGH LIFT  . CYSTO/  RIGHT URETERAL STENT PLACEMENT  12-30-2010  . CYSTOSCOPY WITH RETROGRADE PYELOGRAM, URETEROSCOPY AND STENT PLACEMENT Right 10/07/2013   Procedure: CYSTOSCOPY WITH RETROGRADE PYELOGRAM, right URETEROSCOPY AND STENT PLACEMENT, stone extraction;  Surgeon: Arvil Persons, MD;  Location: [facility];  Service: Urology;  Laterality: Right;  . CYSTOSCOPY WITH STENT PLACEMENT Right 06/04/2013    Procedure: CYSTOSCOPY WITH STENT PLACEMENT;  Surgeon: Franchot Gallo, MD;  Location: [facility];  Service: Urology;  Laterality: Right;  . DILATATION & CURETTAGE/HYSTEROSCOPY WITH MYOSURE N/A 04/27/2015   Procedure: DILATATION & CURETTAGE/HYSTEROSCOPY WITH MYOSURE;  Surgeon: Terrance Mass, MD;  Location: [facility];  Service: Gynecology;  Laterality: N/A;  . ENDARTERECTOMY FEMORAL Left 06/08/2014   Procedure: Left Leg Common Femoral and External Iliac  Endartarectomy with patch Angioplasty;  Surgeon: Rosetta Posner, MD;  [facility];  Service: Vascular;  Laterality: Left;  . ESOPHAGOGASTRODUODENOSCOPY N/A 10/14/2018   Procedure: ESOPHAGOGASTRODUODENOSCOPY (EGD);  Surgeon: Irving Copas., MD;  Location: [facility];  Service: Gastroenterology;  Laterality: N/A;  . ESOPHAGOGASTRODUODENOSCOPY (EGD) WITH PROPOFOL N/A 11/06/2018   Procedure: ESOPHAGOGASTRODUODENOSCOPY (EGD) WITH PROPOFOL;  Surgeon: Rush Landmark Telford Nab., MD;  Location[facility];  Service: Gastroenterology;  Laterality: N/A;  RFA  . ESOPHAGOGASTRODUODENOSCOPY (EGD) WITH PROPOFOL N/A 02/12/2019   Procedure: ESOPHAGOGASTRODUODENOSCOPY (EGD) WITH PROPOFOL;  Surgeon: Rush Landmark Telford Nab., MD;  Location: [facility];  Service: Gastroenterology;  Laterality: N/A;  . EXTRACORPOREAL SHOCK WAVE LITHOTRIPSY Right 08-04-2013//   06-23-2013//   01-16-2011  . EYE SURGERY Bilateral    cataract  . FEMORAL-POPLITEAL BYPASS GRAFT Left 06/08/2014   Procedure: Left Leg Femoral -Popliteal Bypass Graft;  Surgeon: Rosetta Posner, MD;  Location: [facility];  Service: Vascular;  Laterality: Left;  .  FEMORAL-POPLITEAL BYPASS GRAFT Left 06/09/2014   Procedure: Left Femoral and Popliteal Exposure; Left Femoral to Anterior Tibial Bypass Graft using Propaten 46mm by 80cm Goretex Graft; Left Tibial Endarterectomy; Left Femoraland Popliteal Thrombectomy ;  Surgeon: Serafina Mitchell, MD;  Location: [facility];  Service: Vascular;  Laterality: Left;  . GI RADIOFREQUENCY  ABLATION N/A 11/06/2018   Procedure: GI RADIOFREQUENCY ABLATION;  Surgeon: Irving Copas., MD;  Location[facility];  Service: Gastroenterology;  Laterality: N/A;  . GI RADIOFREQUENCY ABLATION N/A 02/12/2019   Procedure: GI RADIOFREQUENCY ABLATION;  Surgeon: Rush Landmark Telford Nab., MD;  Location: [facility];  Service: Gastroenterology;  Laterality: N/A;  . HOLMIUM LASER APPLICATION Right [id number]   Procedure: HOLMIUM LASER APPLICATION;  Surgeon: Arvil Persons, MD;  Location: [facility];  Service: Urology;  Laterality: Right;  . KIDNEY STONE SURGERY  April 2015   1-2 stones  . LAPAROSCOPIC GASTRIC BANDING  05-29-2005  . LITHOTRIPSY  2-3 times  . LOWER EXTREMITY ANGIOGRAM N/A 06/04/2014   Procedure: LOWER EXTREMITY ANGIOGRAM;  Surgeon: Serafina Mitchell, MD;  Location: [facility];  Service: Cardiovascular;  Laterality: N/A;  . ORIF FIFTH METACARPAL [place]  RIGHT HAND  04-21-2002  . REVISION AND RE-SITING LAP-BAND PORT  04-08-2010   W/  UPPER EGD  . RIGHT KNEE PATELLECTOMY W/ REPAIR OF EXTENSOR MECHANISM  04-14-2002  . Toenail removed Left Jan. 21, 2016   2nd toenail-  Dr. Barkley Bruns   No current facility-administered medications for this encounter.   Allergies  Allergen Reactions  . Amlodipine Swelling  . Atorvastatin Other (See Comments)    Muscle aches   . Ampicillin Nausea And Vomiting and Other (See Comments)  . Codeine Nausea And Vomiting    Ask patient  . Lisinopril Cough       . Penicillins Nausea And Vomiting    Did it involve swelling of the face/tongue/throat, SOB, or low BP? No Did it involve sudden or severe rash/hives, skin peeling, or any reaction on the inside of your mouth or nose? No Did you need to seek medical attention at a hospital or doctor's office? No When did it last happen?20+ years If all above answers are "NO", may proceed with cephalosporin use.   [name] [Sitagliptin] Nausea And Vomiting   Family History  Problem Relation  Age of Onset  . Diabetes Father   . Heart disease Father   . Deep vein thrombosis Father   . Hyperlipidemia Father   . Diabetes Sister   . Heart disease Sister   . Deep vein thrombosis Sister   . Hyperlipidemia Sister   . Diabetes Brother   . Heart disease Brother   . Cancer Brother   . Hyperlipidemia Brother   . Colon cancer Maternal Aunt   . Diabetes Brother   . Diabetes Brother   . Kidney disease Mother   . Hyperlipidemia Mother   . Other Mother        AAA   and    Amputation  . Arthritis Other   . Cancer Other        colon  . Hypertension Other   . Stroke Other   . Esophageal cancer Neg Hx   . Stomach cancer Neg Hx   . Inflammatory bowel disease Neg Hx   . Liver disease Neg Hx   . Pancreatic cancer Neg Hx    Social History   Socioeconomic History  . Marital status: Single    Spouse name: Not on file  .  Number of children: Not on file  . Years of education: Not on file  . Highest education level: Not on file  Occupational History  . 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 Smoking status: Former Smoker    Packs/day: 1.00    Years: 28.00    Pack years: 28.00    Types: Cigarettes    Quit date: 09/05/2001    Years since quitting: 17.9  . Smokeless tobacco: Never Used  Substance and Sexual Activity  . Alcohol use: No    Alcohol/week: 0.0 standard drinks  . Drug use: No  . Sexual activity: Yes  Other Topics Concern  . Not on file  Social History Narrative   Regular exercise- yes   Social Determinants of Health   Financial Resource Strain:   . Difficulty of Paying Living Expenses:   Food Insecurity:   . Worried About Charity fundraiser in the Last Year:   . Arboriculturist in the Last Year:   Transportation Needs:   . Film/video editor (Medical):   [name] Lack of Transportation (Non-Medical):   Physical Activity:   . Days of Exercise per Week:   . Minutes of Exercise per Session:   Stress:   . Feeling of Stress :    Social Connections:   . Frequency of Communication with Friends and Family:   . Frequency of Social Gatherings with Friends and Family:   . Attends Religious Services:   . Active Member of Clubs or Organizations:   . Attends Archivist Meetings:   [name] Marital Status:   Intimate Partner Violence:   . Fear of Current or Ex-Partner:   . Emotionally Abused:   [name] Physically Abused:   . Sexually Abused:     Physical Exam: Vital signs in last 24 hours:     GEN: NAD EYE: Sclerae anicteric ENT: MMM CV: Non-tachycardic GI: Soft, NT/ND NEURO:  Alert & Oriented x 3  Lab Results: No results for input(s): WBC, HGB, HCT, PLT in the last 72 hours. BMET No results for input(s): NA, K, CL, CO2, GLUCOSE, BUN, CREATININE, CALCIUM in the last 72 hours. LFT No results for input(s): PROT, ALBUMIN, AST, ALT, ALKPHOS, BILITOT, BILIDIR, IBILI in the last 72 hours. PT/INR No results for input(s): LABPROT, INR in the last 72 hours.   Impression / Plan: This is a 64 y.o.female who presents for EGD with possible RFA of Barrett's esophagus per protocol.  The risks and benefits of endoscopic evaluation were discussed with the patient; these include but are not limited to the risk of perforation, infection, bleeding, missed lesions, lack of diagnosis, severe illness requiring hospitalization, as well as anesthesia and sedation related illnesses.  The patient is agreeable to proceed.    Justice Britain, MD [facility] Office # [phone]

## 2019-08-04 NOTE — Telephone Encounter (Signed)
Per Dr Rush Landmark the pt needs GI cocktail and carafate sent to [facility] and return to work note to return to work in 35 hours.  Letter printed and will be at the front desk

## 2019-08-04 NOTE — Discharge Instructions (Signed)
YOU HAD AN ENDOSCOPIC PROCEDURE TODAY: Refer to the procedure report and other information in the discharge instructions given to you for any specific questions about what was found during the examination. If this information does not answer your questions, please call [place] office at [phone] to clarify.   YOU SHOULD EXPECT: Some feelings of bloating in the abdomen. Passage of more gas than usual. Walking can help get rid of the air that was put into your GI tract during the procedure and reduce the bloating. If you had a lower endoscopy (such as a colonoscopy or flexible sigmoidoscopy) you may notice spotting of blood in your stool or on the toilet paper. Some abdominal soreness may be present for a day or two, also.  DIET: Your first meal following the procedure should be a light meal and then it is ok to progress to your normal diet. A half-sandwich or bowl of soup is an example of a good first meal. Heavy or fried foods are harder to digest and may make you feel nauseous or bloated. Drink plenty of fluids but you should avoid alcoholic beverages for 24 hours. If you had a esophageal dilation, please see attached instructions for diet.    ACTIVITY: Your care partner should take you home directly after the procedure. You should plan to take it easy, moving slowly for the rest of the day. You can resume normal activity the day after the procedure however YOU SHOULD NOT DRIVE, use power tools, machinery or perform tasks that involve climbing or major physical exertion for 24 hours (because of the sedation medicines used during the test).   SYMPTOMS TO REPORT IMMEDIATELY: A gastroenterologist can be reached at any hour. Please call [phone]  for any of the following symptoms:   Following upper endoscopy (EGD, EUS, ERCP, esophageal dilation) Vomiting of blood or coffee ground material  New, significant abdominal pain  New, significant chest pain or pain under the shoulder blades  Painful or  persistently difficult swallowing  New shortness of breath  Black, tarry-looking or red, bloody stools  FOLLOW UP:  If any biopsies were taken you will be contacted by phone or by letter within the next 1-3 weeks. Call [phone]  if you have not heard about the biopsies in 3 weeks.  Please also call with any specific questions about appointments or follow up tests.  

## 2019-08-05 LAB — SURGICAL PATHOLOGY

## 2019-08-05 NOTE — Progress Notes (Signed)
Attempted, unable to leave message/bt 

## 2019-08-06 ENCOUNTER — Encounter: Payer: Self-pay | Admitting: Gastroenterology

## 2019-08-06 DIAGNOSIS — H401113 Primary open-angle glaucoma, right eye, severe stage: Secondary | ICD-10-CM | POA: Diagnosis not present

## 2019-08-06 DIAGNOSIS — H401121 Primary open-angle glaucoma, left eye, mild stage: Secondary | ICD-10-CM | POA: Diagnosis not present

## 2019-08-07 ENCOUNTER — Ambulatory Visit: Payer: PPO | Admitting: Obstetrics and Gynecology

## 2019-08-08 ENCOUNTER — Telehealth: Payer: Self-pay | Admitting: Internal Medicine

## 2019-08-08 DIAGNOSIS — I7025 Atherosclerosis of native arteries of other extremities with ulceration: Secondary | ICD-10-CM

## 2019-08-08 NOTE — Chronic Care Management (AMB) (Signed)
  Chronic Care Management   Note  08/08/2019 Name: Murrel Pavloff MRN: [id number] DOB: 08-12-1955  Elfrida Lashway is a 64 y.o. year old female who is a primary care patient of [name], MD. I reached out to [employer] by phone today in response to a referral sent by Ms. Lashawnna Fogel's PCP, [name], MD.   Ms. Sphar was given information about Chronic Care Management services today including:  1. CCM service includes personalized support from designated clinical staff supervised by her physician, including individualized plan of care and coordination with other care providers 2. 24/7 contact phone numbers for assistance for urgent and routine care needs. 3. Service will only be billed when office clinical staff spend 20 minutes or more in a month to coordinate care. 4. Only one practitioner may furnish and bill the service in a calendar month. 5. The patient may stop CCM services at any time (effective at the end of the month) by phone call to the office staff.   Patient agreed to services and verbal consent obtained.   Follow up plan:   Raynicia Dukes [facility] Scheduler

## 2019-08-18 ENCOUNTER — Other Ambulatory Visit: Payer: Self-pay | Admitting: Internal Medicine

## 2019-08-18 DIAGNOSIS — IMO0002 Reserved for concepts with insufficient information to code with codable children: Secondary | ICD-10-CM

## 2019-08-18 DIAGNOSIS — E1151 Type 2 diabetes mellitus with diabetic peripheral angiopathy without gangrene: Secondary | ICD-10-CM

## 2019-08-19 ENCOUNTER — Other Ambulatory Visit: Payer: Self-pay | Admitting: Internal Medicine

## 2019-08-19 ENCOUNTER — Telehealth: Payer: Self-pay | Admitting: Internal Medicine

## 2019-08-19 DIAGNOSIS — E1151 Type 2 diabetes mellitus with diabetic peripheral angiopathy without gangrene: Secondary | ICD-10-CM

## 2019-08-19 DIAGNOSIS — IMO0002 Reserved for concepts with insufficient information to code with codable children: Secondary | ICD-10-CM

## 2019-08-19 DIAGNOSIS — I10 Essential (primary) hypertension: Secondary | ICD-10-CM

## 2019-08-19 MED ORDER — LOSARTAN POTASSIUM-HCTZ 100-12.5 MG PO TABS: 1.0000 | ORAL_TABLET | Freq: Every day | ORAL | 1 refills | Status: DC

## 2019-08-19 NOTE — Telephone Encounter (Signed)
Called to pt.   Informed pt that we can apply for the [facility] patient assistance. Patient agreed and request for the form to be mail to her.   Form has been printed and mailed.

## 2019-08-19 NOTE — Telephone Encounter (Signed)
   Pt c/o medication issue:  1. Name of Medication: VICTOZA 18 MG/3ML SOPN  2. How are you currently taking this medication (dosage and times per day)? n/a  3. Are you having a reaction (difficulty breathing--STAT)? no  4. What is your medication issue? Patient calling to report med too costly

## 2019-08-28 DIAGNOSIS — S98132A Complete traumatic amputation of one left lesser toe, initial encounter: Secondary | ICD-10-CM | POA: Diagnosis not present

## 2019-08-28 DIAGNOSIS — E1151 Type 2 diabetes mellitus with diabetic peripheral angiopathy without gangrene: Secondary | ICD-10-CM | POA: Diagnosis not present

## 2019-08-28 DIAGNOSIS — Z8631 Personal history of diabetic foot ulcer: Secondary | ICD-10-CM | POA: Diagnosis not present

## 2019-09-01 NOTE — Addendum Note (Signed)
Addended by: Aviva Signs M on: 09/01/2019 01:09 PM   Modules accepted: Orders

## 2019-09-02 NOTE — Chronic Care Management (AMB) (Signed)
Chronic Care Management Pharmacy  Name: Theresa Harrington  MRN: [id number] DOB: 06-Feb-1956  Chief Complaint/ HPI  Theresa Harrington Zenia Resides,  64 y.o. , female presents for their Initial CCM visit with the clinical pharmacist via telephone due to COVID-19 Pandemic.  PCP : [name], MD  Their chronic conditions include: HTN, T2DM, PAD, Afib, HLD, GERD/Barrett's esophagus, constipation  Office Visits: 07/03/19 Dr [name] OV: Added SGLT2 for DM control, weight loss and BP control (goal <130/80).  06/18/19 Dr [name] OV: BP not at goal, stopped chlorthalidone and added Edarbi.  Consult Visit: 08/28/19 Dr Gean Quint (podiatry [facility]): hx diabetic food ulcer, amputation of L toe. Rec'd daily foot inspections, no wounds noted.  08/04/19 admission for upper endoxcopy: rx'd lidocaine/antacid mouthwash, liquid Tylenol/hydrocodone, sucralfate suspension x 1 month  Medications: Outpatient Encounter Medications as of 09/03/2019  Medication Sig  . AMBULATORY NON FORMULARY MEDICATION 72m-Viscous lidocaine, 958m10mg/5ml Diyclomine, 27067malox Swish and Swallow 46m53m mouth four times daily.  . Blood Glucose Monitoring Suppl (ONETOUCH VERIO IQ SYSTEM) w/Device KIT USE TO CHECK SUGAR DAILY  . CARTIA XT 120 MG 24 hr capsule Take 1 capsule by mouth once daily (Patient taking differently: Take 120 mg by mouth daily. )  . dorzolamide-timolol (COSOPT) 22.3-6.8 MG/ML ophthalmic solution Place 1 drop into the right eye 2 (two) times daily.   . Empagliflozin-metFORMIN HCl ER (SYNJARDY XR) 12.08-998 MG TB24 Take 1 tablet by mouth daily.  . ezMarland Kitchentimibe (ZETIA) 10 MG tablet Take 10 mg by mouth at bedtime.   . glMarland Kitchencose blood test strip Use to check blood sugar twice daily. DX: E11.8  . Insulin Glargine (BASAGLAR KWIKPEN) 100 UNIT/ML SOPN Inject 0.6 mLs (60 Units total) into the skin daily. (Patient taking differently: Inject 60 Units into the skin at bedtime. )  . insulin lispro (HUMALOG) 100 UNIT/ML injection Inject 14 Units into the skin  3 (three) times daily before meals.  . Insulin Pen Needle (PEN NEEDLES) 32G X 4 MM MISC Inject 1 pen as directed 4 (four) times daily. Use to inject levemir or novolog as directed.  . latanoprost (XALATAN) 0.005 % ophthalmic solution Place 1 drop into both eyes at bedtime.   . loMarland Kitchenartan-hydrochlorothiazide (HYZAAR) 100-12.5 MG tablet Take 1 tablet by mouth daily.  . magnesium oxide (MAG-OX) 400 MG tablet Take 400 mg by mouth at bedtime.   . omMarland Kitchenprazole (PRILOSEC) 40 MG capsule TAKE 1 CAPSULE BY MOUTH TWICE DAILY BEFORE A MEAL (Patient taking differently: Take 40 mg by mouth in the morning and at bedtime. )  . potassium chloride SA (KLOR-CON) 20 MEQ tablet Take 1 tablet (20 mEq total) by mouth daily. (Patient taking differently: Take 20 mEq by mouth 2 (two) times a week. )  . pravastatin (PRAVACHOL) 40 MG tablet Take 1 tablet by mouth once daily (Patient taking differently: Take 40 mg by mouth at bedtime. )  . rivaroxaban (XARELTO) 20 MG TABS tablet Take 1 tablet (20 mg total) by mouth daily.  . sucralfate (CARAFATE) 1 GM/10ML suspension Take 10 mLs (1 g total) by mouth 4 (four) times daily.  . thMarland Kitchenamine 100 MG tablet Take 1 tablet (100 mg total) by mouth every other day.  . VIMarland KitchenTOZA 18 MG/3ML SOPN INJECT 1.8 MG INTO THE SKIN DAILY   No facility-administered encounter medications on file as of 09/03/2019.     Current Diagnosis/Assessment:  SDOH Interventions     Most Recent Value  SDOH Interventions  SDOH Interventions for the Following Domains  Financial Strain  Financial Strain  Interventions  Other (Comment) [Pt will not qualify for LIS based on income,  pursue PAP for Basaglar, Humalog, Synjardy, Xarelto, Ozempic]      Goals Addressed            This Visit's Progress   . Pharmacy Care Plan       CARE PLAN ENTRY  Current Barriers:  . Chronic Disease Management support, education, and care coordination needs related to Hypertension, Hyperlipidemia, Diabetes, and Gastroesophageal Reflux  Disease   Hypertension . Pharmacist Clinical Goal(s): o Over the next 30 days, patient will work with PharmD and providers to achieve BP goal <130/80 . Current regimen:  o Diltiazem ER 120 mg daily o Losartan-HCTZ 100-12.5 mg daily o Synjardy ER 12.08-998 mg once daily . Interventions: o Discussed BP goal and benefits of medications o Counseled on diuretic effect of hydrochlorothiazide and Synjardy (specifically Jardiance component that works by filtering sugar into the urine) o Discussed that losartan-HCTZ can and should be taken with Synjardy . Patient self care activities - Over the next 30 days, patient will: o Check BP 3-5 times weekly, document, and provide at future appointments o Ensure daily salt intake < 2300 mg/day  Hyperlipidemia/Peripheral artery disease Lipid Panel     Component Value Date/Time   CHOL 134 06/04/2019 1122   TRIG 114.0 06/04/2019 1122   HDL 47.90 06/04/2019 1122   LDLCALC 64 06/04/2019 1122 .  Pharmacist Clinical Goal(s): o Over the next 30 days, patient will work with PharmD and providers to maintain LDL goal < 70 . Current regimen:  o Pravastatin 40 mg daily at bedtime o Ezetimibe 10 mg daily at bedtime . Interventions: o Discussed Zetia (ezetimibe) is now generic and should be covered by insurance with low tier copay . Patient self care activities - Over the next 30 days, patient will: o Take cholesterol lowering medications as prescribed at bedtime  Diabetes Hgb A1c MFr Bld  Date Value Ref Range Status  06/04/2019 7.3 (H) 4.6 - 6.5 % Final .  Pharmacist Clinical Goal(s): o Over the next 30 days, patient will work with PharmD and providers to maintain A1c goal <8% . Current regimen:  o Synjardy XR 12.08-998 mg once daily o Basaglar 60 units at bedtime o Humalog 10 units 3 times daily with meals o Victoza 1.8 mg injected once daily . Interventions: o Discussed insulin resistance and difficulties with weight loss o Discussed that Victoza  and Synjardy can help with weight loss o Discussed Ozempic has greater weight loss than Victoza in clinical trials o Recommended switch Victoza to Ozempic for improved weight loss potential . Patient self care activities - Over the next 30 days, patient will: o Check blood sugar twice daily, in the morning before eating or drinking, and at bedtime, document, and provide at future appointments o Contact provider with any episodes of hypoglycemia  GERD/Barrett's Esophagus . Pharmacist Clinical Goal(s) o Over the next 30 days, patient will work with PharmD and providers to optimize GERD regimen . Current regimen:  o Omeprazole 40 mg once daily at bedtime . Interventions: o Discussed omeprazole works best to prevent heartburn if taken 30 minutes before eating, however since patient does not have heartburn symptoms and takes for Barrett's esophagus she ma continue to take at bedtime . Patient self care activities - Over the next 30 days, patient will: o Continue omeprazole 40 mg at bedtime  Vitamins/Supplements . Pharmacist Clinical Goal(s) o Over the next 30 days, patient will work with PharmD and providers  to optimize vitamin regimen . Current regimen:  o Thiamine 100 mg every day at bedtime o Potassium chloride 20 mEq twice a week o Magnesium oxide 400 mg at bedtime . Interventions: o Discussed thiamine (Vitamin B1) level was low at last check and patient will benefit from daily supplementation o Discussed potassium level was on low end of normal at last check and may continue taking potassium as previously o Discussed magnesium level has not been checked since 2018 but it was low at that time, patient may benefit from continued supplementation . Patient self care activities - Over the next 30 days, patient will: o Increase thiamine (Vitamin B1) to daily dosing o Continue potassium and magnesium supplements as previously tolerated  Medication management . Pharmacist Clinical  Goal(s): o Over the next 30 days, patient will work with PharmD and providers to achieve optimal medication adherence and reduce medication costs . Current pharmacy: [facility] . Interventions o Comprehensive medication review performed. o Pursue patient assistance for [employer], Synjardy, Xarelto, Ozempic o Utilize [facility] pharmacy for medication synchronization, packaging and delivery . Patient self care activities - Over the next 30 days, patient will: o Focus on medication adherence by pill pack o Take medications as prescribed o Report any questions or concerns to PharmD and/or provider(s) o Complete patient sections of patient assistance applications and attach proof of income as directed  Initial goal documentation        Hypertension/AFib   Patient is currently rate controlled. HR 144 BPM - stayed to see Dr [name]  Office blood pressures are  BP Readings from Last 3 Encounters:  08/04/19 (!) 169/61  07/03/19 (!) 154/76  06/18/19 (!) 142/78   Patient has failed these meds in the past: amlodipine (swelling), lisinopril (cough) Patient is currently controlled on the following medications:   Diltiazem ER 120 mg daily  Losartan-HCTZ 100-12.5 mg daily  Synjardy XR 12.08-998 mg once daily  Xarelto 20 mg daily  Patient checks BP at home 3-5x per week  Patient home BP readings are ranging: 130-140s/80s  We discussed diet and exercise extensively; pt thought losartan-HCTZ was dc'd by PCP but reviewed chart and appears pt should still be taking along with diltiazem, Synjardy. Discussed mechanism of Synjardy as a diuretic.   Plan  Continue current medications and control with diet and exercise    Hyperlipidemia/PAD   Lipid Panel     Component Value Date/Time   CHOL 134 06/04/2019 1122   TRIG 114.0 06/04/2019 1122   HDL 47.90 06/04/2019 1122   CHOLHDL 3 06/04/2019 1122   VLDL 22.8 06/04/2019 1122   LDLCALC 64 06/04/2019 1122    ASCVD: hx of  PAD  Patient has failed these meds in past: atorvastatin (myalgia) Patient is currently controlled on the following medications:   Pravastatin 40 mg daily HS  Ezetimibe 10 mg daily HS  We discussed:  diet and exercise extensively, cholesterol goals. Pt was worried about cost of Zetia, counseled that it is now generic and should be < $15/month.  Plan  Continue current medications and control with diet and exercise  Diabetes   Recent Relevant Labs: Lab Results  Component Value Date/Time   HGBA1C 6.3 (A) 09/03/2019 11:14 AM   HGBA1C 7.3 (H) 06/04/2019 11:22 AM   HGBA1C 8.4 (H) 01/08/2019 11:34 AM   GFR 91.83 06/04/2019 11:22 AM   GFR 93.34 01/08/2019 11:34 AM   MICROALBUR 42.3 (H) 02/26/2019 10:45 AM   MICROALBUR 6.3 (H) 03/05/2018 10:29 AM  Checking BG: 2x per Day  Recent FBG Readings: 90-108, 145 this morning Recent HS BG readings: 150-170, rarely 200 Hypoglycemia: ~1-2 times per month, 30-40 overnight  Patient has failed these meds in past: Januvia (n/v) Patient is currently controlled on the following medications:   Empagliflozin-metformin ER (Synjardy XR) 12.08-998 mg once daily  Basaglar 60 units HS  Humalog 10 units TID with meals  Victoza 1.8 mg Kendrick daily  Last diabetic Eye exam:  Lab Results  Component Value Date/Time   HMDIABEYEEXA No Retinopathy 12/04/2018 12:00 AM    Last diabetic Foot exam:  Lab Results  Component Value Date/Time   HMDIABFOOTEX done 10/31/2017 12:00 AM     We discussed: diet and exercise extensively; pt is planning to get a stationary bike and ride 20-30 min per day.  -Discussed insulin resistance; pt is frustrated she has not lost weight since starting Victoza. Just started Synjardy last month and no weight loss yet either. Discussed improved weight loss with Ozempic in clinical trials. We were going to pusue PAP for Victoza but may consider Ozempic instead for further wt loss benefit.  -discussed hypoglycemic events; pt does  not remember if she took extra Humalog or skipped a meal. Pt treats appropriately with candies or soda and rechecks BG to make sure it improves to > 70  Plan  Continue current medications Recommend switch Victoza to Ozempic for improved wt loss benefit PAP for Synjardy, Basaglar, Humalog, Ozempic Continue monitoring BG twice daily  GERD/Barrett's   Patient has failed these meds in past: n/a Patient is currently controlled on the following medications:   Omeprazole 40 mg once daily HS  We discussed:  Pt denies heartburn/reflux symptoms, taking PPI mainly for Barrett's. Counseled PPI works best for heartburn when taken 30-60 min prior to a meal.  Plan  Continue current medications  Glaucoma   Patient has failed these meds in past: n/a Patient is currently controlled on the following medications:   Dorzolamide-timolol - 1 drop R eye BID  Latanoprost 0.005% - 1 drop both eyes HS  We discussed:  Pt denies issues with eye drops  Plan  Continue current medications  Health Maintenance   Patient is currently controlled on the following medications:   Thiamine 100 mg once daily  Potassium chloride 20 mEq twice a week  Magnesium oxide 400 mg HS  We discussed:  B1 level was low in Feb, counseled to take Thiamine supplement daily. She also does not like to take KCl because the tablet is so large, discussed alternatives including powder or liquid however pt would rather just continue tablets since she has a large supply.  Plan   Continue current medications  Medication Management   Pt uses [place] pharmacy for all medications Uses pill box? No - uses vials Pt endorses 90% compliance  We discussed: Verbal consent obtained for [facility] enhanced pharmacy services (medication synchronization, adherence packaging, delivery coordination). A medication sync plan was created to allow patient to get all medications delivered once every 30 to 90 days per patient  preference. Patient understands they have freedom to choose pharmacy and clinical pharmacist will coordinate care between all prescribers and [facility].   Plan  Utilize [facility] pharmacy for medication synchronization, packaging and delivery    Follow up: 1 month phone visit  Charlene Brooke, PharmD Clinical Pharmacist [facility] [phone]

## 2019-09-03 ENCOUNTER — Ambulatory Visit (INDEPENDENT_AMBULATORY_CARE_PROVIDER_SITE_OTHER): Payer: PPO | Admitting: Internal Medicine

## 2019-09-03 ENCOUNTER — Other Ambulatory Visit: Payer: Self-pay

## 2019-09-03 ENCOUNTER — Encounter: Payer: Self-pay | Admitting: Internal Medicine

## 2019-09-03 ENCOUNTER — Ambulatory Visit: Payer: PPO | Admitting: Pharmacist

## 2019-09-03 VITALS — BP 172/92 | HR 140 | Temp 98.5°F | Resp 16 | Ht 65.0 in | Wt 180.0 lb

## 2019-09-03 DIAGNOSIS — I10 Essential (primary) hypertension: Secondary | ICD-10-CM | POA: Diagnosis not present

## 2019-09-03 DIAGNOSIS — I4891 Unspecified atrial fibrillation: Secondary | ICD-10-CM | POA: Diagnosis not present

## 2019-09-03 DIAGNOSIS — IMO0002 Reserved for concepts with insufficient information to code with codable children: Secondary | ICD-10-CM

## 2019-09-03 DIAGNOSIS — E1165 Type 2 diabetes mellitus with hyperglycemia: Secondary | ICD-10-CM

## 2019-09-03 DIAGNOSIS — K22711 Barrett's esophagus with high grade dysplasia: Secondary | ICD-10-CM

## 2019-09-03 DIAGNOSIS — E519 Thiamine deficiency, unspecified: Secondary | ICD-10-CM

## 2019-09-03 DIAGNOSIS — G63 Polyneuropathy in diseases classified elsewhere: Secondary | ICD-10-CM

## 2019-09-03 DIAGNOSIS — E1151 Type 2 diabetes mellitus with diabetic peripheral angiopathy without gangrene: Secondary | ICD-10-CM | POA: Diagnosis not present

## 2019-09-03 DIAGNOSIS — E538 Deficiency of other specified B group vitamins: Secondary | ICD-10-CM

## 2019-09-03 DIAGNOSIS — E785 Hyperlipidemia, unspecified: Secondary | ICD-10-CM

## 2019-09-03 LAB — POCT GLYCOSYLATED HEMOGLOBIN (HGB A1C): Hemoglobin A1C: 6.3 % — AB (ref 4.0–5.6)

## 2019-09-03 MED ORDER — NEBIVOLOL HCL 5 MG PO TABS: 5.0000 mg | ORAL_TABLET | Freq: Every day | ORAL | 0 refills | Status: DC

## 2019-09-03 MED ORDER — BASAGLAR KWIKPEN 100 UNIT/ML ~~LOC~~ SOPN: 30.0000 [IU] | PEN_INJECTOR | Freq: Every day | SUBCUTANEOUS | 1 refills | Status: DC

## 2019-09-03 MED ORDER — CYANOCOBALAMIN 1000 MCG/ML IJ SOLN
1000.0000 ug | Freq: Once | INTRAMUSCULAR | Status: AC
  Administered 2019-09-03: 1000 ug via INTRAMUSCULAR

## 2019-09-03 NOTE — Patient Instructions (Signed)

## 2019-09-03 NOTE — Patient Instructions (Signed)
Visit Information  Thank you for meeting with me to discuss your medications! I look forward to working with you to achieve your health care goals. Below is a summary of what we talked about during the visit:  Goals Addressed            This Visit's Progress   . Pharmacy Care Plan       CARE PLAN ENTRY  Current Barriers:  . Chronic Disease Management support, education, and care coordination needs related to Hypertension, Hyperlipidemia, Diabetes, and Gastroesophageal Reflux Disease   Hypertension . Pharmacist Clinical Goal(s): o Over the next 30 days, patient will work with PharmD and providers to achieve BP goal <130/80 . Current regimen:  o Diltiazem ER 120 mg daily o Losartan-HCTZ 100-12.5 mg daily o Synjardy ER 12.08-998 mg once daily . Interventions: o Discussed BP goal and benefits of medications o Counseled on diuretic effect of hydrochlorothiazide and Synjardy (specifically Jardiance component that works by filtering sugar into the urine) o Discussed that losartan-HCTZ can and should be taken with Synjardy . Patient self care activities - Over the next 30 days, patient will: o Check BP 3-5 times weekly, document, and provide at future appointments o Ensure daily salt intake < 2300 mg/day  Hyperlipidemia/Peripheral artery disease Lipid Panel     Component Value Date/Time   CHOL 134 06/04/2019 1122   TRIG 114.0 06/04/2019 1122   HDL 47.90 06/04/2019 1122   LDLCALC 64 06/04/2019 1122 .  Pharmacist Clinical Goal(s): o Over the next 30 days, patient will work with PharmD and providers to maintain LDL goal < 70 . Current regimen:  o Pravastatin 40 mg daily at bedtime o Ezetimibe 10 mg daily at bedtime . Interventions: o Discussed Zetia (ezetimibe) is now generic and should be covered by insurance with low tier copay . Patient self care activities - Over the next 30 days, patient will: o Take cholesterol lowering medications as prescribed at bedtime  Diabetes Hgb  A1c MFr Bld  Date Value Ref Range Status  06/04/2019 7.3 (H) 4.6 - 6.5 % Final .  Pharmacist Clinical Goal(s): o Over the next 30 days, patient will work with PharmD and providers to maintain A1c goal <8% . Current regimen:  o Synjardy XR 12.08-998 mg once daily o Basaglar 60 units at bedtime o Humalog 10 units 3 times daily with meals o Victoza 1.8 mg injected once daily . Interventions: o Discussed insulin resistance and difficulties with weight loss o Discussed that Victoza and Synjardy can help with weight loss o Discussed Ozempic has greater weight loss than Victoza in clinical trials o Recommended switch Victoza to Ozempic for improved weight loss potential . Patient self care activities - Over the next 30 days, patient will: o Check blood sugar twice daily, in the morning before eating or drinking, and at bedtime, document, and provide at future appointments o Contact provider with any episodes of hypoglycemia  GERD/Barrett's Esophagus . Pharmacist Clinical Goal(s) o Over the next 30 days, patient will work with PharmD and providers to optimize GERD regimen . Current regimen:  o Omeprazole 40 mg once daily at bedtime . Interventions: o Discussed omeprazole works best to prevent heartburn if taken 30 minutes before eating, however since patient does not have heartburn symptoms and takes for Barrett's esophagus she ma continue to take at bedtime . Patient self care activities - Over the next 30 days, patient will: o Continue omeprazole 40 mg at bedtime  Vitamins/Supplements . Pharmacist Clinical Goal(s) o Over  the next 30 days, patient will work with PharmD and providers to optimize vitamin regimen . Current regimen:  o Thiamine 100 mg every day at bedtime o Potassium chloride 20 mEq twice a week o Magnesium oxide 400 mg at bedtime . Interventions: o Discussed thiamine (Vitamin B1) level was low at last check and patient will benefit from daily supplementation o Discussed  potassium level was on low end of normal at last check and may continue taking potassium as previously o Discussed magnesium level has not been checked since 2018 but it was low at that time, patient may benefit from continued supplementation . Patient self care activities - Over the next 30 days, patient will: o Increase thiamine (Vitamin B1) to daily dosing o Continue potassium and magnesium supplements as previously tolerated  Medication management . Pharmacist Clinical Goal(s): o Over the next 30 days, patient will work with PharmD and providers to achieve optimal medication adherence and reduce medication costs . Current pharmacy: [facility] . Interventions o Comprehensive medication review performed. o Pursue patient assistance for [employer], Synjardy, Xarelto, Ozempic o Utilize [facility] pharmacy for medication synchronization, packaging and delivery . Patient self care activities - Over the next 30 days, patient will: o Focus on medication adherence by pill pack o Take medications as prescribed o Report any questions or concerns to PharmD and/or provider(s) o Complete patient sections of patient assistance applications and attach proof of income as directed  Initial goal documentation        Ms. Heifetz was given information about Chronic Care Management services today including:  1. CCM service includes personalized support from designated clinical staff supervised by her physician, including individualized plan of care and coordination with other care providers 2. 24/7 contact phone numbers for assistance for urgent and routine care needs. 3. Standard insurance, coinsurance, copays and deductibles apply for chronic care management only during months in which we provide at least 20 minutes of these services. Most insurances cover these services at 100%, however patients may be responsible for any copay, coinsurance and/or deductible if applicable. This service may help you  avoid the need for more expensive face-to-face services. 4. Only one practitioner may furnish and bill the service in a calendar month. 5. The patient may stop CCM services at any time (effective at the end of the month) by phone call to the office staff.  Patient agreed to services and verbal consent obtained.   The patient verbalized understanding of instructions provided today and agreed to receive a mailed copy of patient instruction and/or educational materials. Telephone follow up appointment with pharmacy team member scheduled for: 1 month  Charlene Brooke, PharmD Clinical Pharmacist [facility] [phone]    Insulin Resistance  Insulin is a hormone that helps to control blood sugar (glucose) levels in the body. It is made in the pancreas. Insulin allows glucose to enter cells in the body. Insulin sensitivity refers to how the body responds to insulin. Insulin resistance occurs when cells in the body do not respond properly to insulin made by the pancreas and are not able to absorb glucose from the bloodstream. Insulin resistance results in high blood glucose levels (hyperglycemia) and can lead to problems, including:  Prediabetes.  Type 2 diabetes (type 2 diabetes mellitus).  Heart disease.  High blood pressure (hypertension).  Stroke.  Polycystic ovary syndrome (PCOS).  Nonalcoholic fatty liver disease. What are the causes? The exact cause of insulin resistance is not known. What increases the risk?  The following factors may make you more likely to develop insulin resistance:  Being overweight or obese, especially if a lot of your weight is in your waist area.  Having an inactive (sedentary) lifestyle.  Using steroids.  Being older than age 47.  Having sleep apnea.  Using tobacco products. What are the signs or symptoms? This condition usually does not cause symptoms. How is this diagnosed? There is no test to diagnose insulin  resistance. However, your health care provider may diagnose insulin resistance based on:  Your blood glucose levels.  Your cholesterol levels.  A measurement of the distance around your waist (circumference). A waist circumference of more than 35 inches (88.9 cm) for women and more than 40 inches (101.6 cm) for men may be a sign of insulin resistance.  Your risk factors.  A physical exam.  Your medical history. How is this treated? Insulin resistance is treated with nutrition and lifestyle changes. These changes may include:  Eating a healthy balance of nutritious foods.  Getting more physical activity.  Maintaining a healthy weight.  Stopping the use of any tobacco products. Your health care provider will work with you to change your nutrition and lifestyle as needed. In some cases, treatment may also include medicine to improve your insulin sensitivity. Follow these instructions at home: Activity  Be physically active. Do moderate-intensity physical activity for 30 minutes or more on 5 or more days of the week, or as much as told by your health care provider. This could be brisk walking, biking, or water aerobics.  Ask your health care provider what activities are safe for you. A mix of physical activities may be best, such as walking, swimming, cycling, and strength training. Eating and drinking   Follow a healthy meal plan. This includes eating lean proteins, complex carbohydrates, fresh fruits and vegetables, low-fat dairy products, and healthy fats.  Follow instructions from your health care provider about eating or drinking restrictions.  Make an appointment to see a diet and nutrition specialist (registered dietitian) to help you create a healthy eating plan. General instructions  Check your blood glucose levels as told by your health care provider.  Take over-the-counter and prescription medicines only as told by your health care provider.  Lose weight as told by  your health care provider. ? Losing 5-7% of your body weight can reverse insulin resistance. ? Your health care provider can determine how much weight loss is best for you and can help you lose weight safely.  Do not use any tobacco products, such as cigarettes, chewing tobacco, and e-cigarettes. If you need help quitting, ask your health care provider.  Keep all follow-up visits as told by your health care provider. This is important. Contact a health care provider if:  You have trouble losing weight or maintaining your goal weight.  You gain weight.  You have trouble following your prescribed meal plan.  You have trouble exercising more. Summary  Insulin resistance occurs when cells in the body do not respond properly to insulin made by the pancreas and are not able to absorb blood sugar (glucose) from the bloodstream.  Your health care provider will work with you to change your nutrition and lifestyle as needed. Treatment may also include medicine to improve your insulin sensitivity.  Keep all follow-up visits as told by your health care provider. This is important. This information is not intended to replace advice given to you by your health care provider. Make sure you discuss any questions you  have with your health care provider. Document Revised: 03/30/2017 Document Reviewed: 05/21/2015 Elsevier Patient Education  2020 [employer].

## 2019-09-03 NOTE — Progress Notes (Signed)
Subjective:  Patient ID: Theresa Harrington, female    DOB: [birth year]  Age: 64 y.o. MRN: [id number]  CC: Hypertension, Diabetes, and Atrial Fibrillation  This visit occurred during the SARS-CoV-2 public health emergency.  Safety protocols were in place, including screening questions prior to the visit, additional usage of staff PPE, and extensive cleaning of exam room while observing appropriate contact time as indicated for disinfecting solutions.    HPI Theresa Harrington presents for f/up - She is concerned that her blood pressure is not adequately well controlled.  She denies headache, blurred vision, palpitations, dizziness, lightheadedness, chest pain, shortness of breath, or edema.  She tells me she is compliant with all of her medications.  Outpatient Medications Prior to Visit  Medication Sig Dispense Refill  . AMBULATORY NON FORMULARY MEDICATION 68m-Viscous lidocaine, 977m10mg/5ml Diyclomine, 27010malox Swish and Swallow 53m42m mouth four times daily. 550 mL 1  . Blood Glucose Monitoring Suppl (ONETOUCH VERIO IQ SYSTEM) w/Device KIT USE TO CHECK SUGAR DAILY 1 kit 0  . CARTIA XT 120 MG 24 hr capsule Take 1 capsule by mouth once daily (Patient taking differently: Take 120 mg by mouth daily. ) 90 capsule 3  . dorzolamide-timolol (COSOPT) 22.3-6.8 MG/ML ophthalmic solution Place 1 drop into the right eye 2 (two) times daily.     . Empagliflozin-metFORMIN HCl ER (SYNJARDY XR) 12.08-998 MG TB24 Take 1 tablet by mouth daily. 140 tablet 0  . ezetimibe (ZETIA) 10 MG tablet Take 10 mg by mouth at bedtime.     . glMarland Kitchencose blood test strip Use to check blood sugar twice daily. DX: E11.8 200 each 3  . insulin lispro (HUMALOG) 100 UNIT/ML injection Inject 14 Units into the skin 3 (three) times daily before meals.    . Insulin Pen Needle (PEN NEEDLES) 32G X 4 MM MISC Inject 1 pen as directed 4 (four) times daily. Use to inject levemir or novolog as directed. 400 each 3  . latanoprost (XALATAN) 0.005 %  ophthalmic solution Place 1 drop into both eyes at bedtime.     . loMarland Kitchenartan-hydrochlorothiazide (HYZAAR) 100-12.5 MG tablet Take 1 tablet by mouth daily. 90 tablet 1  . magnesium oxide (MAG-OX) 400 MG tablet Take 400 mg by mouth at bedtime.     . omMarland Kitchenprazole (PRILOSEC) 40 MG capsule TAKE 1 CAPSULE BY MOUTH TWICE DAILY BEFORE A MEAL (Patient taking differently: Take 40 mg by mouth in the morning and at bedtime. ) 90 capsule 0  . potassium chloride SA (KLOR-CON) 20 MEQ tablet Take 1 tablet (20 mEq total) by mouth daily. (Patient taking differently: Take 20 mEq by mouth 2 (two) times a week. ) 90 tablet 1  . pravastatin (PRAVACHOL) 40 MG tablet Take 1 tablet by mouth once daily (Patient taking differently: Take 40 mg by mouth at bedtime. ) 90 tablet 0  . rivaroxaban (XARELTO) 20 MG TABS tablet Take 1 tablet (20 mg total) by mouth daily. 90 tablet 1  . sucralfate (CARAFATE) 1 GM/10ML suspension Take 10 mLs (1 g total) by mouth 4 (four) times daily. 420 mL 1  . thiamine 100 MG tablet Take 1 tablet (100 mg total) by mouth every other day. 45 tablet 1  . VICTOZA 18 MG/3ML SOPN INJECT 1.8 MG INTO THE SKIN DAILY 9 mL 0  . Insulin Glargine (BASAGLAR KWIKPEN) 100 UNIT/ML SOPN Inject 0.6 mLs (60 Units total) into the skin daily. (Patient taking differently: Inject 60 Units into the skin at bedtime. ) 15 mL 1  No facility-administered medications prior to visit.    ROS Review of Systems  Constitutional: Negative.  Negative for chills, diaphoresis, fatigue and unexpected weight change.  HENT: Negative.   Eyes: Negative for visual disturbance.  Respiratory: Negative for chest tightness, shortness of breath and wheezing.   Cardiovascular: Negative for chest pain, palpitations and leg swelling.  Gastrointestinal: Negative for abdominal pain.  Endocrine: Negative.  Negative for polydipsia, polyphagia and polyuria.  Genitourinary: Negative.  Negative for difficulty urinating.  Musculoskeletal: Negative.   Negative for arthralgias, back pain and myalgias.  Skin: Negative.  Negative for color change.  Neurological: Negative.  Negative for dizziness, weakness, light-headedness and numbness.  Hematological: Negative for adenopathy. Does not bruise/bleed easily.  Psychiatric/Behavioral: Negative.     Objective:  BP (!) 172/92 (BP Location: Left Arm, Patient Position: Sitting, Cuff Size: Normal)   Pulse (!) 140   Temp 98.5 F (36.9 C) (Oral)   Resp 16   Ht '5\' 5"'$  (1.651 m)   Wt 180 lb (81.6 kg)   LMP  (LMP Unknown)   SpO2 97%   BMI 29.95 kg/m   BP Readings from Last 3 Encounters:  09/03/19 (!) 172/92  08/04/19 (!) 169/61  07/03/19 (!) 154/76    Wt Readings from Last 3 Encounters:  09/03/19 180 lb (81.6 kg)  08/04/19 180 lb (81.6 kg)  07/03/19 186 lb 6 oz (84.5 kg)    Physical Exam Vitals reviewed.  Constitutional:      Appearance: Normal appearance.  HENT:     Nose: Nose normal.     Mouth/Throat:     Mouth: Mucous membranes are moist.  Eyes:     General: No scleral icterus.    Conjunctiva/sclera: Conjunctivae normal.  Cardiovascular:     Rate and Rhythm: Tachycardia present. Rhythm irregularly irregular.     Heart sounds: No murmur. No friction rub. No gallop.      Comments: EKG - A fib with RVR, 133 bpm Low voltage Otherwise normal EKG Pulmonary:     Effort: Pulmonary effort is normal.     Breath sounds: No stridor. No wheezing, rhonchi or rales.  Abdominal:     General: Abdomen is protuberant. Bowel sounds are normal.     Palpations: There is no hepatomegaly, splenomegaly or mass.     Tenderness: There is no abdominal tenderness.  Musculoskeletal:     Cervical back: Neck supple.     Right lower leg: No edema.     Left lower leg: No edema.  Lymphadenopathy:     Cervical: No cervical adenopathy.  Skin:    General: Skin is warm and dry.     Coloration: Skin is not pale.  Neurological:     General: No focal deficit present.     Mental Status: She is alert.    Psychiatric:        Mood and Affect: Mood normal.        Behavior: Behavior normal.     Lab Results  Component Value Date   WBC 10.0 07/03/2019   HGB 11.7 (L) 07/03/2019   HCT 34.0 (L) 07/03/2019   PLT 220.0 07/03/2019   GLUCOSE 68 (L) 06/04/2019   CHOL 134 06/04/2019   TRIG 114.0 06/04/2019   HDL 47.90 06/04/2019   LDLDIRECT 160.2 03/01/2010   LDLCALC 64 06/04/2019   ALT 20 10/08/2018   AST 18 10/08/2018   NA 141 06/04/2019   K 3.7 06/04/2019   CL 104 06/04/2019   CREATININE 0.77 06/04/2019   BUN  15 06/04/2019   CO2 29 06/04/2019   TSH 0.42 06/04/2019   INR 1.05 07/16/2014   HGBA1C 6.3 (A) 09/03/2019   MICROALBUR 42.3 (H) 02/26/2019    No results found.  Assessment & Plan:   Theresa Harrington was seen today for hypertension, diabetes and atrial fibrillation.  Diagnoses and all orders for this visit:  Atrial fibrillation with RVR ([place])- She is asymptomatic with this but her heart rate is too high.  I recommended that she add nebivolol to her current regimen. -     EKG 12-Lead -     nebivolol (BYSTOLIC) 5 MG tablet; Take 1 tablet (5 mg total) by mouth daily. -     Ambulatory referral to Cardiology  Essential hypertension, benign- Her blood pressure is not adequately well controlled.  I recommend that she add nebivolol to her current antihypertensive regimen. -     EKG 12-Lead -     nebivolol (BYSTOLIC) 5 MG tablet; Take 1 tablet (5 mg total) by mouth daily.  Uncontrolled type 2 diabetes mellitus with peripheral artery disease ([place])- Her A1c is down to 6.3%.  She is not aware of any hypoglycemic episodes.  I have asked her to decrease the dose of her basal insulin by 50%. -     POCT glycosylated hemoglobin (Hb A1C) -     Insulin Glargine (BASAGLAR KWIKPEN) 100 UNIT/ML; Inject 0.3 mLs (30 Units total) into the skin daily.  Vitamin B12 deficiency neuropathy ([facility]) -     cyanocobalamin ((VITAMIN B-12)) injection 1,000 mcg   I have changed Theresa Harrington's Basaglar KwikPen. I am  also having her start on nebivolol. Additionally, I am having her maintain her Pen Needles, latanoprost, ezetimibe, OneTouch Verio IQ System, glucose blood, Cartia XT, pravastatin, dorzolamide-timolol, magnesium oxide, rivaroxaban, thiamine, omeprazole, Synjardy XR, potassium chloride SA, insulin lispro, AMBULATORY NON FORMULARY MEDICATION, sucralfate, Victoza, and losartan-hydrochlorothiazide. We administered cyanocobalamin.  Meds ordered this encounter  Medications  . cyanocobalamin ((VITAMIN B-12)) injection 1,000 mcg  . nebivolol (BYSTOLIC) 5 MG tablet    Sig: Take 1 tablet (5 mg total) by mouth daily.    Dispense:  84 tablet    Refill:  0  . Insulin Glargine (BASAGLAR KWIKPEN) 100 UNIT/ML    Sig: Inject 0.3 mLs (30 Units total) into the skin daily.    Dispense:  15 mL    Refill:  1    Please disregard previous rx.     Follow-up: Return in about 2 months (around 11/03/2019).  Theresa Calico, MD

## 2019-09-04 ENCOUNTER — Telehealth: Payer: Self-pay

## 2019-09-04 ENCOUNTER — Encounter: Payer: Self-pay | Admitting: Cardiology

## 2019-09-04 ENCOUNTER — Ambulatory Visit: Payer: PPO | Admitting: Cardiology

## 2019-09-04 VITALS — BP 120/64 | HR 90 | Ht 65.0 in | Wt 180.1 lb

## 2019-09-04 DIAGNOSIS — Z7901 Long term (current) use of anticoagulants: Secondary | ICD-10-CM | POA: Diagnosis not present

## 2019-09-04 DIAGNOSIS — T502X5A Adverse effect of carbonic-anhydrase inhibitors, benzothiadiazides and other diuretics, initial encounter: Secondary | ICD-10-CM

## 2019-09-04 DIAGNOSIS — I4891 Unspecified atrial fibrillation: Secondary | ICD-10-CM

## 2019-09-04 DIAGNOSIS — I7025 Atherosclerosis of native arteries of other extremities with ulceration: Secondary | ICD-10-CM

## 2019-09-04 DIAGNOSIS — I10 Essential (primary) hypertension: Secondary | ICD-10-CM | POA: Diagnosis not present

## 2019-09-04 DIAGNOSIS — I480 Paroxysmal atrial fibrillation: Secondary | ICD-10-CM | POA: Diagnosis not present

## 2019-09-04 DIAGNOSIS — E1169 Type 2 diabetes mellitus with other specified complication: Secondary | ICD-10-CM

## 2019-09-04 DIAGNOSIS — I739 Peripheral vascular disease, unspecified: Secondary | ICD-10-CM

## 2019-09-04 DIAGNOSIS — IMO0002 Reserved for concepts with insufficient information to code with codable children: Secondary | ICD-10-CM

## 2019-09-04 DIAGNOSIS — I779 Disorder of arteries and arterioles, unspecified: Secondary | ICD-10-CM | POA: Diagnosis not present

## 2019-09-04 DIAGNOSIS — E876 Hypokalemia: Secondary | ICD-10-CM

## 2019-09-04 DIAGNOSIS — E1151 Type 2 diabetes mellitus with diabetic peripheral angiopathy without gangrene: Secondary | ICD-10-CM

## 2019-09-04 MED ORDER — EZETIMIBE 10 MG PO TABS: 10.0000 mg | ORAL_TABLET | Freq: Every day | ORAL | 1 refills | Status: DC

## 2019-09-04 MED ORDER — OMEPRAZOLE 40 MG PO CPDR: 40.0000 mg | DELAYED_RELEASE_CAPSULE | Freq: Every day | ORAL | 1 refills | Status: DC

## 2019-09-04 MED ORDER — PRAVASTATIN SODIUM 40 MG PO TABS: 40.0000 mg | ORAL_TABLET | Freq: Every day | ORAL | 1 refills | Status: DC

## 2019-09-04 MED ORDER — MAGNESIUM OXIDE 400 MG PO TABS: 400.0000 mg | ORAL_TABLET | Freq: Every day | ORAL | 1 refills | Status: DC

## 2019-09-04 MED ORDER — LOSARTAN POTASSIUM-HCTZ 100-12.5 MG PO TABS: 1.0000 | ORAL_TABLET | Freq: Every day | ORAL | 1 refills | Status: DC

## 2019-09-04 MED ORDER — POTASSIUM CHLORIDE CRYS ER 20 MEQ PO TBCR: 20.0000 meq | EXTENDED_RELEASE_TABLET | Freq: Every day | ORAL | 1 refills | Status: DC

## 2019-09-04 MED ORDER — RIVAROXABAN 20 MG PO TABS: 20.0000 mg | ORAL_TABLET | Freq: Every day | ORAL | 1 refills | Status: DC

## 2019-09-04 NOTE — Telephone Encounter (Signed)
-----   Message from [place], [place] [facility] sent at 09/03/2019  4:56 PM EDT ----- Regarding: Med refills Adrean is switching to [facility], can you order her refills? She would prefer 90 days if possible.  Ezetimibe 10 mg  Losartan-HCTZ 100-12.5 mg Magnesium oxide 400 mg Pravastatin 40 mg Thiamine 100 mg Omeprazole 40 mg Potassium chloride 20 meq Xarelto 20 mg

## 2019-09-04 NOTE — Progress Notes (Addendum)
Cardiology Office Note   Date:  09/04/2019   ID:  Ricci Paff, DOB 04/16/1956, MRN [id number]  PCP:  [name], MD  Cardiologist:  Dr. Luci Bank    Chief Complaint  Patient presents with   Atrial Fibrillation      History of Present Illness: Juley Stobaugh is a 64 y.o. female who presents for recurrence of atrial fib.   prior history of paroxysmal atrial fibrillation on chronic anticoagulation with Xarelto, DM 2, peripheral vascular disease s/p femoropopliteal bypass 06/2014 and hypertension.  Ms. Mcraney was last seen by Dr. Marlou Porch on 07/31/2017 in follow-up without specific complaints.  She was initially seen at the request of Dr. [name] for atrial fibrillation and was placed on Xarelto. Her clopidogrel (was on secondary to femoropopliteal bypass) was stopped to avoid excessive bleeding complications.  She was noted to be tolerating her medications well without s/s of bleeding.    Last seen on telephone visit 09/10/18 .  She is without specific complaints.  Her BP is markedly elevated however she reports she has been helping her 32-year-old grandson with his math homework.  Her repeat blood pressures during our visit were improved however still moderately elevated.  May need further medication titration.  She states she will have mild, intermittent palpitations, only occurring 2-3 times in the last 6 months.  She denies chest pain, shortness of breath, LE swelling, orthopnea, PND, dizziness or syncope.  She reports complete medication compliance with Xarelto.  No reports of acute blood in stool or urine.  She continues to follow with [facility] for her history of PVD although she has not seen them recently secondary to COVID-19.  She follows with her PCP closely for her diabetes control.  Hemoglobin A1c continues to be elevated at 8.3 from 07/22/2018 in which her PCP placed her on injectable medications that she has not yet started (using old medicine first).   Today she is seen with a fib with RVR at  140 after seeing Dr. [name] yesterday. she was placed on Bystolic 5 mg. BP was up as well 172/92. , DM with improved control  Last echo 2018 EF 55-60%  Mild MS LA severely dilated.   In July 2018 she was in [place]  Had EGD 08/04/19 for Barretts esophagus and did have esophagitis and found HH.  She is unaware of her atrial fib. No chest pain and no SOB.   Today her BP is improved with the addition of the Bystolic.  HR is improved and with EKG .a fib with improved HR now 109.  No sT changes   Past Medical History:  Diagnosis Date   Allergy    Anxiety    Pt. denies   Arthritis    right index figer   Asymptomatic cholelithiasis    Atherosclerosis of aorta (HCC)    Cataract    Clotting disorder ([place])    Gallstones 06/2013   GERD (gastroesophageal reflux disease)    History of kidney stones    lithotrispy   Hyperlipidemia    Hypertension    Hypothyroidism    no meds   Iron deficiency anemia    PAD (peripheral artery disease) (HCC)    PAF (paroxysmal atrial fibrillation) (HCC)    Peripheral arterial occlusive disease (HCC)    lower extremities   Pneumonia    PONV (postoperative nausea and vomiting)    Right ureteral stone    Type 2 diabetes mellitus (HCC)    Type   Vitamin B 12 deficiency  Wears glasses     Past Surgical History:  Procedure Laterality Date   AMPUTATION Left 07/16/2014   Procedure: LEFT SECOND TOE AMPUTATION;  Surgeon: Serafina Mitchell, MD;  Location: [facility];  Service: Vascular;  Laterality: Left;  With Nerve block   AUGMENTATION MAMMAPLASTY     BELPHAROPTOSIS REPAIR     eyelid lift   BIOPSY  10/14/2018   Procedure: BIOPSY;  Surgeon: Rush Landmark Telford Nab., MD;  Location: [place];  Service: Gastroenterology;;   BIOPSY  08/04/2019   Procedure: BIOPSY;  Surgeon: Irving Copas., MD;  Location: [facility];  Service: Gastroenterology;;   COLONOSCOPY     COMBINED AUGMENTATION MAMMAPLASTY AND ABDOMINOPLASTY  2009   W/  BILATERAL   THIGH LIFT   CYSTO/  RIGHT URETERAL STENT PLACEMENT  12-30-2010   CYSTOSCOPY WITH RETROGRADE PYELOGRAM, URETEROSCOPY AND STENT PLACEMENT Right 10/07/2013   Procedure: CYSTOSCOPY WITH RETROGRADE PYELOGRAM, right URETEROSCOPY AND STENT PLACEMENT, stone extraction;  Surgeon: Arvil Persons, MD;  Location: [facility];  Service: Urology;  Laterality: Right;   CYSTOSCOPY WITH STENT PLACEMENT Right 06/04/2013   Procedure: CYSTOSCOPY WITH STENT PLACEMENT;  Surgeon: Franchot Gallo, MD;  Location: [facility];  Service: Urology;  Laterality: Right;   DILATATION & CURETTAGE/HYSTEROSCOPY WITH MYOSURE N/A 04/27/2015   Procedure: DILATATION & CURETTAGE/HYSTEROSCOPY WITH MYOSURE;  Surgeon: Terrance Mass, MD;  Location: [facility];  Service: Gynecology;  Laterality: N/A;   ENDARTERECTOMY FEMORAL Left 06/08/2014   Procedure: Left Leg Common Femoral and External Iliac  Endartarectomy with patch Angioplasty;  Surgeon: Rosetta Posner, MD;  Location: [facility];  Service: Vascular;  Laterality: Left;   ESOPHAGOGASTRODUODENOSCOPY N/A 10/14/2018   Procedure: ESOPHAGOGASTRODUODENOSCOPY (EGD);  Surgeon: Irving Copas., MD;  Location: [facility];  Service: Gastroenterology;  Laterality: N/A;   ESOPHAGOGASTRODUODENOSCOPY (EGD) WITH PROPOFOL N/A 11/06/2018   Procedure: ESOPHAGOGASTRODUODENOSCOPY (EGD) WITH PROPOFOL;  Surgeon: Rush Landmark Telford Nab., MD;  [facility];  Service: Gastroenterology;  Laterality: N/A;  RFA   ESOPHAGOGASTRODUODENOSCOPY (EGD) WITH PROPOFOL N/A 02/12/2019   Procedure: ESOPHAGOGASTRODUODENOSCOPY (EGD) WITH PROPOFOL;  Surgeon: Rush Landmark Telford Nab., MD;  Location: [facility];  Service: Gastroenterology;  Laterality: N/A;   ESOPHAGOGASTRODUODENOSCOPY (EGD) WITH PROPOFOL N/A 08/04/2019   Procedure: ESOPHAGOGASTRODUODENOSCOPY (EGD) WITH PROPOFOL;  Surgeon: Rush Landmark Telford Nab., MD;  Location: [facility];  Service: Gastroenterology;  Laterality: N/A;  WITH RFA    EXTRACORPOREAL SHOCK WAVE LITHOTRIPSY Right 08-04-2013//   06-23-2013//   01-16-2011   EYE SURGERY Bilateral    cataract   FEMORAL-POPLITEAL BYPASS GRAFT Left 06/08/2014   Procedure: Left Leg Femoral -Popliteal Bypass Graft;  Surgeon: Rosetta Posner, MD;  Location: [facility];  Service: Vascular;  Laterality: Left;   FEMORAL-POPLITEAL BYPASS GRAFT Left 06/09/2014   Procedure: Left Femoral and Popliteal Exposure; Left Femoral to Anterior Tibial Bypass Graft using Propaten 20m by 80cm Goretex Graft; Left Tibial Endarterectomy; Left Femoraland Popliteal Thrombectomy ;  Surgeon: VSerafina Mitchell MD;  [place]  Service: Vascular;  Laterality: Left;   GI RADIOFREQUENCY ABLATION N/A 11/06/2018   Procedure: GI RADIOFREQUENCY ABLATION;  Surgeon: MIrving Copas, MD;  [facility];  Service: Gastroenterology;  Laterality: N/A;   GI RADIOFREQUENCY ABLATION N/A 02/12/2019   Procedure: GI RADIOFREQUENCY ABLATION;  Surgeon: MRush LandmarkGTelford Nab, MD;  [facility];  Service: Gastroenterology;  Laterality: N/A;   GI RADIOFREQUENCY ABLATION N/A 08/04/2019   Procedure: GI RADIOFREQUENCY ABLATION;  Surgeon: MRush LandmarkGTelford Nab, MD;  [place]  Service: Gastroenterology;  Laterality: N/A;   HOLMIUM LASER APPLICATION Right 11/01/2593   Procedure: HOLMIUM LASER APPLICATION;  Surgeon: Arvil Persons, MD;  Location: [facility];  Service: Urology;  Laterality: Right;   KIDNEY STONE SURGERY  April 2015   1-2 stones   LAPAROSCOPIC GASTRIC BANDING  05-29-2005   LITHOTRIPSY  2-3 times   LOWER EXTREMITY ANGIOGRAM N/A 06/04/2014   Procedure: LOWER EXTREMITY ANGIOGRAM;  Surgeon: Serafina Mitchell, MD;  Location: [facility] C[facility];  Service: Cardiovascular;  Laterality: N/A;   ORIF FIFTH METACARPAL FX  RIGHT HAND  04-21-2002   REVISION AND RE-SITING LAP-BAND PORT  04-08-2010   W/  UPPER EGD   RIGHT KNEE PATELLECTOMY W/ REPAIR OF EXTENSOR MECHANISM  04-14-2002    Toenail removed Left Jan. 21, 2016   2nd toenail-  Dr. Barkley Bruns     Current Outpatient Medications  Medication Sig Dispense Refill   AMBULATORY NON FORMULARY MEDICATION 42m-Viscous lidocaine, 975m10mg/5ml Diyclomine, 27071malox Swish and Swallow 75m70m mouth four times daily. 550 mL 1   Blood Glucose Monitoring Suppl (ONETOUCH VERIO IQ SYSTEM) w/Device KIT USE TO CHECK SUGAR DAILY 1 kit 0   CARTIA XT 120 MG 24 hr capsule Take 1 capsule by mouth once daily (Patient taking differently: Take 120 mg by mouth daily. ) 90 capsule 3   dorzolamide-timolol (COSOPT) 22.3-6.8 MG/ML ophthalmic solution Place 1 drop into the right eye 2 (two) times daily.      Empagliflozin-metFORMIN HCl ER (SYNJARDY XR) 12.08-998 MG TB24 Take 1 tablet by mouth daily. 140 tablet 0   ezetimibe (ZETIA) 10 MG tablet Take 10 mg by mouth at bedtime.      glucose blood test strip Use to check blood sugar twice daily. DX: E11.8 200 each 3   Insulin Glargine (BASAGLAR KWIKPEN) 100 UNIT/ML Inject 0.3 mLs (30 Units total) into the skin daily. 15 mL 1   insulin lispro (HUMALOG) 100 UNIT/ML injection Inject 14 Units into the skin 3 (three) times daily before meals.     Insulin Pen Needle (PEN NEEDLES) 32G X 4 MM MISC Inject 1 pen as directed 4 (four) times daily. Use to inject levemir or novolog as directed. 400 each 3   latanoprost (XALATAN) 0.005 % ophthalmic solution Place 1 drop into both eyes at bedtime.      losartan-hydrochlorothiazide (HYZAAR) 100-12.5 MG tablet Take 1 tablet by mouth daily. 90 tablet 1   magnesium oxide (MAG-OX) 400 MG tablet Take 400 mg by mouth at bedtime.      nebivolol (BYSTOLIC) 5 MG tablet Take 1 tablet (5 mg total) by mouth daily. 84 tablet 0   omeprazole (PRILOSEC) 40 MG capsule TAKE 1 CAPSULE BY MOUTH TWICE DAILY BEFORE A MEAL (Patient taking differently: Take 40 mg by mouth in the morning and at bedtime. ) 90 capsule 0   potassium chloride SA (KLOR-CON) 20 MEQ tablet Take 1 tablet  (20 mEq total) by mouth daily. (Patient taking differently: Take 20 mEq by mouth 2 (two) times a week. ) 90 tablet 1   pravastatin (PRAVACHOL) 40 MG tablet Take 1 tablet by mouth once daily (Patient taking differently: Take 40 mg by mouth at bedtime. ) 90 tablet 0   rivaroxaban (XARELTO) 20 MG TABS tablet Take 1 tablet (20 mg total) by mouth daily. 90 tablet 1   sucralfate (CARAFATE) 1 GM/10ML suspension Take 10 mLs (1 g total) by mouth 4 (four) times daily. 420 mL 1   thiamine 100 MG tablet Take 1 tablet (  100 mg total) by mouth every other day. 45 tablet 1   VICTOZA 18 MG/3ML SOPN INJECT 1.8 MG INTO THE SKIN DAILY 9 mL 0   No current facility-administered medications for this visit.    Allergies:   Amlodipine, Atorvastatin, Ampicillin, Codeine, Lisinopril, Penicillins, and Januvia [sitagliptin]    Social History:  The patient  reports that she quit smoking about 18 years ago. Her smoking use included cigarettes. She has a 28.00 pack-year smoking history. She has never used smokeless tobacco. She reports that she does not drink alcohol or use drugs.   Family History:  The patient's family history includes Arthritis in an other family member; Cancer in her brother and another family member; Colon cancer in her maternal aunt; Deep vein thrombosis in her father and sister; Diabetes in her brother, brother, brother, father, and sister; Heart disease in her brother, father, and sister; Hyperlipidemia in her brother, father, mother, and sister; Hypertension in an other family member; Kidney disease in her mother; Other in her mother; Stroke in an other family member.    ROS:  General:no colds or fevers, no weight changes Skin:no rashes or ulcers HEENT:no blurred vision, no congestion CV:see HPI PUL:see HPI GI:no diarrhea constipation or melena, no indigestion GU:no hematuria, no dysuria MS:no joint pain, no claudication Neuro:no syncope, no lightheadedness Endo:+ diabetes stable, no thyroid  disease  Wt Readings from Last 3 Encounters:  09/04/19 180 lb 1.9 oz (81.7 kg)  09/03/19 180 lb (81.6 kg)  08/04/19 180 lb (81.6 kg)     PHYSICAL EXAM: VS:  BP 120/64    Pulse 90    Ht _0  (1.651 m)    Wt 180 lb 1.9 oz (81.7 kg)    LMP  (LMP Unknown)    SpO2 98%    BMI 29.97 kg/m  , BMI Body mass index is 29.97 kg/m. General:Pleasant affect, NAD Skin:Warm and dry, brisk capillary refill HEENT:normocephalic, sclera clear, mucus membranes moist Neck:supple, no JVD, no bruits  Heart:irreg irreg without murmur, gallup, rub or click Lungs:clear without rales, rhonchi, or wheezes HFW:YOVZ, non tender, + BS, do not palpate liver spleen or masses Ext:no lower ext edema, 2+ pedal pulses, 2+ radial pulses Neuro:alert and oriented X 3, MAE, follows commands, + facial symmetry    EKG:  EKG is ordered today. The ekg ordered today demonstrates atrial fib with RVR at 109  I do not have EKG from Dr. [name] office not yet scanned.     Recent Labs: 10/08/2018: ALT 20 06/04/2019: BUN 15; Creatinine, Ser 0.77; Potassium 3.7; Sodium 141; TSH 0.42 07/03/2019: Hemoglobin 11.7; Platelets 220.0    Lipid Panel    Component Value Date/Time   CHOL 134 06/04/2019 1122   TRIG 114.0 06/04/2019 1122   HDL 47.90 06/04/2019 1122   CHOLHDL 3 06/04/2019 1122   VLDL 22.8 06/04/2019 1122   LDLCALC 64 06/04/2019 1122   LDLDIRECT 160.2 03/01/2010 1001       Other studies Reviewed: Additional studies/ records that were reviewed today include: . 11/22/16 Study Conclusions   - Left ventricle: The cavity size was normal. Wall thickness was  normal. Systolic function was normal. The estimated ejection  fraction was in the range of 55% to 60%. Wall motion was normal;  there were no regional wall motion abnormalities. The study is  not technically sufficient to allow evaluation of LV diastolic  function.  - Mitral valve: Moderately calcified annulus. Mildly thickened  leaflets . Mild stenosis.  There was trivial regurgitation.  Valve  area by pressure half-time: 1.79 cm^2.  - Left atrium: Severely dilated.  - Right atrium: The atrium was mildly dilated.  - Inferior vena cava: The vessel was normal in size. The  respirophasic diameter changes were in the normal range (>= 50%),  consistent with normal central venous pressure.   Impressions:   - LVEF 55-60%, normal wall thickness, normal wall motion, moderate  MAC with mild stenosis and trivial MR, severe LAE, mild RAE,  normal IVC.   ASSESSMENT AND PLAN:  1.  Atrial fib with RVR now improved on bystolic.  She is on dilt as well.  Will see if she converts over next week and bring back for EKG and plan DCCV, though will check with Dr. Marlou Porch, could send to a fib clinic to add antiarrythmic. I reviewed old records and Dr. [name] appt.   2.  Anticoagulation on xarelto and has not missed doses.  Continue- discussed importance of not missing any doses. CHA2DS2VASc of 4  3.   HTN improved control with addition of Bystolic.   4.  PAD hx of fem pop bypass. Per Dr. Donnetta Hutching   5.  DM2 per PCP on insulin.     ADDENDUM  09/05/19 Dr. Marlou Porch agree with plan if still in a fib on follow up then arrange DCCV.    Current medicines are reviewed with the patient today.  The patient Has no concerns regarding medicines.  The following changes have been made:  See above Labs/ tests ordered today include:see above  Disposition:   FU:  see above  Signed, Cecilie Kicks, NP  09/04/2019 3:33 PM    [facility] [address], [place], [place] [employer] [place], [place] Phone: [phone]; Fax: [phone]

## 2019-09-04 NOTE — Telephone Encounter (Signed)
erx has been sent as requested.  

## 2019-09-04 NOTE — Patient Instructions (Signed)
Medication Instructions:   Your physician recommends that you continue on your current medications as directed. Please refer to the Current Medication list given to you today.  *If you need a refill on your cardiac medications before your next appointment, please call your pharmacy*  Lab Work: BMET MAG CBC TODAY    If you have labs (blood work) drawn today and your tests are completely normal, you will receive your results only by: [name] MyChart Message (if you have MyChart) OR . A paper copy in the mail If you have any lab test that is abnormal or we need to change your treatment, we will call you to review the results.   Testing/Procedures: Your physician has requested that you have an echocardiogram. Echocardiography is a painless test that uses sound waves to create images of your heart. It provides your doctor with information about the size and shape of your heart and how well your heart's chambers and valves are working. This procedure takes approximately one hour. There are no restrictions for this procedure.   Follow-Up: At [facility], you and your health needs are our priority.  As part of our continuing mission to provide you with exceptional heart care, we have created designated Provider Care Teams.  These Care Teams include your primary Cardiologist (physician) and Advanced Practice Providers (APPs -  Physician Assistants and Nurse Practitioners) who all work together to provide you with the care you need, when you need it.  We recommend signing up for the patient portal called "MyChart".  Sign up information is provided on this After Visit Summary.  MyChart is used to connect with patients for Virtual Visits (Telemedicine).  Patients are able to view lab/test results, encounter notes, upcoming appointments, etc.  Non-urgent messages can be sent to your provider as well.   To learn more about what you can do with MyChart, go to [email or web address].    Your next appointment:    1 week(s)  The format for your next appointment:   In Person  Provider:   You may see [name], MD or one of the following Advanced Practice Providers on your designated Care Team:    Truitt Merle, NP  Cecilie Kicks, NP  Kathyrn Drown, NP    Other Instructions

## 2019-09-05 LAB — MAGNESIUM: Magnesium: 1.8 mg/dL (ref 1.6–2.3)

## 2019-09-05 LAB — BASIC METABOLIC PANEL
BUN/Creatinine Ratio: 11 — ABNORMAL LOW (ref 12–28)
BUN: 10 mg/dL (ref 8–27)
CO2: 23 mmol/L (ref 20–29)
Calcium: 9.6 mg/dL (ref 8.7–10.3)
Chloride: 103 mmol/L (ref 96–106)
Creatinine, Ser: 0.92 mg/dL (ref 0.57–1.00)
GFR calc Af Amer: 77 mL/min/{1.73_m2} (ref >59)
GFR calc non Af Amer: 67 mL/min/{1.73_m2} (ref >59)
Glucose: 209 mg/dL — ABNORMAL HIGH (ref 65–99)
Potassium: 3.6 mmol/L (ref 3.5–5.2)
Sodium: 142 mmol/L (ref 134–144)

## 2019-09-05 LAB — CBC
Hematocrit: 43.9 % (ref 34.0–46.6)
Hemoglobin: 14 g/dL (ref 11.1–15.9)
MCH: 28.6 pg (ref 26.6–33.0)
MCHC: 31.9 g/dL (ref 31.5–35.7)
MCV: 90 fL (ref 79–97)
Platelets: 293 10*3/uL (ref 150–450)
RBC: 4.89 x10E6/uL (ref 3.77–5.28)
RDW: 13.9 % (ref 11.7–15.4)
WBC: 9.9 10*3/uL (ref 3.4–10.8)

## 2019-09-10 ENCOUNTER — Other Ambulatory Visit: Payer: Self-pay | Admitting: Internal Medicine

## 2019-09-10 ENCOUNTER — Ambulatory Visit: Payer: PPO | Admitting: Physician Assistant

## 2019-09-10 ENCOUNTER — Encounter: Payer: Self-pay | Admitting: Physician Assistant

## 2019-09-10 ENCOUNTER — Other Ambulatory Visit: Payer: Self-pay

## 2019-09-10 VITALS — BP 108/70 | HR 111 | Ht 65.0 in | Wt 176.0 lb

## 2019-09-10 DIAGNOSIS — I10 Essential (primary) hypertension: Secondary | ICD-10-CM | POA: Diagnosis not present

## 2019-09-10 DIAGNOSIS — I480 Paroxysmal atrial fibrillation: Secondary | ICD-10-CM

## 2019-09-10 DIAGNOSIS — I4891 Unspecified atrial fibrillation: Secondary | ICD-10-CM | POA: Diagnosis not present

## 2019-09-10 DIAGNOSIS — Z7901 Long term (current) use of anticoagulants: Secondary | ICD-10-CM | POA: Diagnosis not present

## 2019-09-10 DIAGNOSIS — I739 Peripheral vascular disease, unspecified: Secondary | ICD-10-CM

## 2019-09-10 DIAGNOSIS — IMO0002 Reserved for concepts with insufficient information to code with codable children: Secondary | ICD-10-CM

## 2019-09-10 MED ORDER — DILTIAZEM HCL ER COATED BEADS 180 MG PO CP24: 180.0000 mg | ORAL_CAPSULE | Freq: Every day | ORAL | 3 refills | Status: DC

## 2019-09-10 MED ORDER — DILTIAZEM HCL ER COATED BEADS 180 MG PO CP24
180.0000 mg | ORAL_CAPSULE | Freq: Every day | ORAL | 3 refills | Status: DC
Start: 2019-09-10 — End: 2019-09-10

## 2019-09-10 NOTE — Progress Notes (Signed)
Cardiology Office Note    Date:  09/10/2019   ID:  Theresa Harrington, DOB Nov 03, 1955, MRN [id number]  PCP:  [name], MD  Cardiologist:  Dr. Marlou Porch  Chief Complaint: afib  follow up  History of Present Illness:   Theresa Harrington is a 64 y.o. female with history of paroxysmal atrial fibrillation, diabetes mellitus, peripheral vascular disease s/p femoropopliteal bypass in 2016, hypertension and on chronic anticoagulation seen for atrial fibrillation follow-up.  Seen by Cecilie Kicks last week for A. fib RVR which was found on EKG at PCP office.  Patient was asymptomatic.  Elevated blood pressure and placed on Bystolic by PCP.  Patient is here for follow-up.  Heart rate fluctuating between 60-120s.  Denies chest pain, shortness of breath, orthopnea, PND, syncope, lower extremity edema, palpitation.  Patient reports compliance with Xarelto.   Past Medical History:  Diagnosis Date  . Allergy   . Anxiety    Pt. denies  . Arthritis    right index figer  . Asymptomatic cholelithiasis   . Atherosclerosis of aorta ([place])   . Cataract   . Clotting disorder ([place])   . Gallstones 06/2013  . GERD (gastroesophageal reflux disease)   . History of kidney stones    lithotrispy  . Hyperlipidemia   . Hypertension   . Hypothyroidism    no meds  . Iron deficiency anemia   . PAD (peripheral artery disease) ([place])   . PAF (paroxysmal atrial fibrillation) ([place])   . Peripheral arterial occlusive disease (HCC)    lower extremities  . Pneumonia   . PONV (postoperative nausea and vomiting)   . Right ureteral stone   . Type 2 diabetes mellitus (HCC)    Type  . Vitamin B 12 deficiency   . Wears glasses     Past Surgical History:  Procedure Laterality Date  . AMPUTATION Left 07/16/2014   Procedure: LEFT SECOND TOE AMPUTATION;  Surgeon: Serafina Mitchell, MD;  Location: [place];  Service: Vascular;  Laterality: Left;  With Nerve block  . AUGMENTATION MAMMAPLASTY    . BELPHAROPTOSIS REPAIR     eyelid  lift  . BIOPSY  10/14/2018   Procedure: BIOPSY;  Surgeon: Rush Landmark Telford Nab., MD;  Location: [place];  Service: Gastroenterology;;  . BIOPSY  08/04/2019   Procedure: BIOPSY;  Surgeon: Irving Copas., MD;  Location: [place];  Service: Gastroenterology;;  . COLONOSCOPY    . COMBINED AUGMENTATION MAMMAPLASTY AND ABDOMINOPLASTY  2009   W/  BILATERAL  THIGH LIFT  . CYSTO/  RIGHT URETERAL STENT PLACEMENT  12-30-2010  . CYSTOSCOPY WITH RETROGRADE PYELOGRAM, URETEROSCOPY AND STENT PLACEMENT Right 10/07/2013   Procedure: CYSTOSCOPY WITH RETROGRADE PYELOGRAM, right URETEROSCOPY AND STENT PLACEMENT, stone extraction;  Surgeon: Arvil Persons, MD;  [facility];  Service: Urology;  Laterality: Right;  . CYSTOSCOPY WITH STENT PLACEMENT Right 06/04/2013   Procedure: CYSTOSCOPY WITH STENT PLACEMENT;  Surgeon: Franchot Gallo, MD;  [facility];  Service: Urology;  Laterality: Right;  . DILATATION & CURETTAGE/HYSTEROSCOPY WITH MYOSURE N/A 04/27/2015   Procedure: DILATATION & CURETTAGE/HYSTEROSCOPY WITH MYOSURE;  Surgeon: Terrance Mass, MD;  Location: [facility];  Service: Gynecology;  Laterality: N/A;  . ENDARTERECTOMY FEMORAL Left 06/08/2014   Procedure: Left Leg Common Femoral and External Iliac  Endartarectomy with patch Angioplasty;  Surgeon: Rosetta Posner, MD;  Location: [facility];  Service: Vascular;  Laterality: Left;  . ESOPHAGOGASTRODUODENOSCOPY N/A 10/14/2018   Procedure: ESOPHAGOGASTRODUODENOSCOPY (EGD);  Surgeon: Justice Britain  Brooke Bonito., MD;  Location: [place];  Service: Gastroenterology;  Laterality: N/A;  . ESOPHAGOGASTRODUODENOSCOPY (EGD) WITH PROPOFOL N/A 11/06/2018   Procedure: ESOPHAGOGASTRODUODENOSCOPY (EGD) WITH PROPOFOL;  Surgeon: Rush Landmark Telford Nab., MD;  [facility];  Service: Gastroenterology;  Laterality: N/A;  RFA  . ESOPHAGOGASTRODUODENOSCOPY (EGD) WITH PROPOFOL N/A 02/12/2019   Procedure: ESOPHAGOGASTRODUODENOSCOPY (EGD) WITH  PROPOFOL;  Surgeon: Rush Landmark Telford Nab., MD;  [facility];  Service: Gastroenterology;  Laterality: N/A;  . ESOPHAGOGASTRODUODENOSCOPY (EGD) WITH PROPOFOL N/A 08/04/2019   Procedure: ESOPHAGOGASTRODUODENOSCOPY (EGD) WITH PROPOFOL;  Surgeon: Rush Landmark Telford Nab., MD;  Location: [place];  Service: Gastroenterology;  Laterality: N/A;  WITH RFA  . EXTRACORPOREAL SHOCK WAVE LITHOTRIPSY Right 08-04-2013//   06-23-2013//   01-16-2011  . EYE SURGERY Bilateral    cataract  . FEMORAL-POPLITEAL BYPASS GRAFT Left 06/08/2014   Procedure: Left Leg Femoral -Popliteal Bypass Graft;  Surgeon: Rosetta Posner, MD;  Location: [place];  Service: Vascular;  Laterality: Left;  . FEMORAL-POPLITEAL BYPASS GRAFT Left 06/09/2014   Procedure: Left Femoral and Popliteal Exposure; Left Femoral to Anterior Tibial Bypass Graft using Propaten 40m by 80cm Goretex Graft; Left Tibial Endarterectomy; Left Femoraland Popliteal Thrombectomy ;  Surgeon: VSerafina Mitchell MD;  [place]  Service: Vascular;  Laterality: Left;  . GI RADIOFREQUENCY ABLATION N/A 11/06/2018   Procedure: GI RADIOFREQUENCY ABLATION;  Surgeon: MIrving Copas, MD;  [facility];  Service: Gastroenterology;  Laterality: N/A;  . GI RADIOFREQUENCY ABLATION N/A 02/12/2019   Procedure: GI RADIOFREQUENCY ABLATION;  Surgeon: MRush LandmarkGTelford Nab, MD;  [facility];  Service: Gastroenterology;  Laterality: N/A;  . GI RADIOFREQUENCY ABLATION N/A 08/04/2019   Procedure: GI RADIOFREQUENCY ABLATION;  Surgeon: MRush LandmarkGTelford Nab, MD;  [place]  Service: Gastroenterology;  Laterality: N/A;  . HOLMIUM LASER APPLICATION Right 60/0/1749  Procedure: HOLMIUM LASER APPLICATION;  Surgeon: MArvil Persons MD;  [facility]  Service: Urology;  Laterality: Right;  . KIDNEY STONE SURGERY  April 2015   1-2 stones  . LAPAROSCOPIC GASTRIC BANDING  05-29-2005  . LITHOTRIPSY  2-3 times  . LOWER  EXTREMITY ANGIOGRAM N/A 06/04/2014   Procedure: LOWER EXTREMITY ANGIOGRAM;  Surgeon: VSerafina Mitchell MD;  [facility];  Service: Cardiovascular;  Laterality: N/A;  . ORIF FIFTH METACARPAL FStevens RIGHT HAND  04-21-2002  . REVISION AND RE-SITING LAP-BAND PORT  04-08-2010   W/  UPPER EGD  . RIGHT KNEE PATELLECTOMY W/ REPAIR OF EXTENSOR MECHANISM  04-14-2002  . Toenail removed Left Jan. 21, 2016   2nd toenail-  Dr. PBarkley Bruns   Current Medications: Prior to Admission medications   Medication Sig Start Date End Date Taking? Authorizing Provider  AMBULATORY NON FORMULARY MEDICATION 939mViscous lidocaine, 9081m0mg/5ml Diyclomine, 270m88mlox Swish and Swallow 30mL69mmouth four times daily. 08/04/19  Yes Mansouraty, GabriTelford Nab  Blood Glucose Monitoring Suppl (ONETOUCH VERIO IQ SYSTEM) w/Device KIT USE TO CHECK SUGAR DAILY 10/11/18  Yes JonesJanith Lima CARTIA XT 120 MG 24 hr capsule Take 1 capsule by mouth once daily 01/21/19  Yes Skains, Mark Thana Farr dorzolamide-timolol (COSOPT) 22.3-6.8 MG/ML ophthalmic solution Place 1 drop into the right eye 2 (two) times daily.  02/05/19  Yes [provider]  Empagliflozin-metFORMIN HCl ER (SYNJARDY XR) 12.08-998 MG TB24 Take 1 tablet by mouth daily. 07/03/19  Yes JonesJanith Lima ezetimibe (ZETIA) 10 MG tablet Take 1 tablet (10 mg total) by mouth at  bedtime. 09/04/19  Yes [name], MD  glucose blood test strip Use to check blood sugar twice daily. DX: E11.8 10/23/18  Yes [name], MD  Insulin Glargine (BASAGLAR KWIKPEN) 100 UNIT/ML Inject 0.3 mLs (30 Units total) into the skin daily. 09/03/19  Yes [name], MD  insulin lispro (HUMALOG) 100 UNIT/ML injection Inject 14 Units into the skin 3 (three) times daily before meals.   Yes [provider]  Insulin Pen Needle (PEN NEEDLES) 32G X 4 MM MISC Inject 1 pen as directed 4 (four) times daily. Use to inject levemir or novolog as directed. 02/22/17  Yes [name], MD    latanoprost (XALATAN) 0.005 % ophthalmic solution Place 1 drop into both eyes at bedtime.  06/18/17  Yes [provider]  losartan-hydrochlorothiazide (HYZAAR) 100-12.5 MG tablet Take 1 tablet by mouth daily. 09/04/19  Yes [name], MD  magnesium oxide (MAG-OX) 400 MG tablet Take 1 tablet (400 mg total) by mouth at bedtime. 09/04/19  Yes [name], MD  nebivolol (BYSTOLIC) 5 MG tablet Take 1 tablet (5 mg total) by mouth daily. 09/03/19  Yes [name], MD  omeprazole (PRILOSEC) 40 MG capsule Take 1 capsule (40 mg total) by mouth daily. TAKE 1 CAPSULE BY MOUTH TWICE DAILY BEFORE A MEAL 09/04/19  Yes [name], MD  potassium chloride SA (KLOR-CON) 20 MEQ tablet Take 1 tablet (20 mEq total) by mouth daily. 09/04/19  Yes [name], MD  pravastatin (PRAVACHOL) 40 MG tablet Take 1 tablet (40 mg total) by mouth daily. 09/04/19  Yes [name], MD  rivaroxaban (XARELTO) 20 MG TABS tablet Take 1 tablet (20 mg total) by mouth daily. 09/04/19  Yes [name], MD  sucralfate (CARAFATE) 1 GM/10ML suspension Take 10 mLs (1 g total) by mouth 4 (four) times daily. 08/04/19  Yes Mansouraty, Telford Nab., MD  thiamine 100 MG tablet Take 1 tablet (100 mg total) by mouth every other day. 06/07/19  Yes [name], MD  VICTOZA 18 MG/3ML SOPN INJECT 1.8 MG INTO THE SKIN DAILY 09/10/19  Yes [name], MD    Allergies:   Amlodipine, Atorvastatin, Ampicillin, Codeine, Lisinopril, Penicillins, and Januvia [sitagliptin]   Social History   Socioeconomic History  . Marital status: Single    Spouse name: Not on file  . Number of children: Not on file  . Years of education: Not on file  . Highest education level: Not on file  Occupational History  . 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 Smoking status: Former Smoker    Packs/day: 1.00    Years: 28.00    Pack years: 28.00    Types: Cigarettes    Quit date: 09/05/2001    Years since quitting: 18.0   . Smokeless tobacco: Never Used  Substance and Sexual Activity  . Alcohol use: No    Alcohol/week: 0.0 standard drinks  . Drug use: No  . Sexual activity: Yes  Other Topics Concern  . Not on file  Social History Narrative   Regular exercise- yes   Social Determinants of Health   Financial Resource Strain: High Risk  . Difficulty of Paying Living Expenses: Hard  Food Insecurity:   . Worried About Charity fundraiser in the Last Year:   . Arboriculturist in the Last Year:   Transportation Needs:   . Film/video editor (Medical):   [name]  Lack of Transportation (Non-Medical):   Physical Activity:   . Days of Exercise per Week:   . Minutes of Exercise per Session:   Stress:   . Feeling of Stress :   Social Connections:   . Frequency of Communication with Friends and Family:   . Frequency of Social Gatherings with Friends and Family:   . Attends Religious Services:   . Active Member of Clubs or Organizations:   . Attends Archivist Meetings:   [name] Marital Status:      Family History:  The patient's family history includes Arthritis in an other family member; Cancer in her brother and another family member; Colon cancer in her maternal aunt; Deep vein thrombosis in her father and sister; Diabetes in her brother, brother, brother, father, and sister; Heart disease in her brother, father, and sister; Hyperlipidemia in her brother, father, mother, and sister; Hypertension in an other family member; Kidney disease in her mother; Other in her mother; Stroke in an other family member.   ROS:   Please see the history of present illness.    ROS All other systems reviewed and are negative.   PHYSICAL EXAM:   VS:  BP 108/70   Pulse (!) 111   Ht _0  (1.651 m)   Wt 176 lb (79.8 kg)   LMP  (LMP Unknown)   SpO2 94%   BMI 29.29 kg/m    GEN: Well nourished, well developed, in no acute distress  HEENT: normal  Neck: no JVD, carotid bruits, or masses Cardiac: Irregular  tachycardic; no murmurs, rubs, or gallops,no edema  Respiratory:  clear to auscultation bilaterally, normal work of breathing GI: soft, nontender, nondistended, + BS MS: no deformity or atrophy  Skin: warm and dry, no rash Neuro:  Alert and Oriented x 3, Strength and sensation are intact Psych: euthymic mood, full affect  Wt Readings from Last 3 Encounters:  09/10/19 176 lb (79.8 kg)  09/04/19 180 lb 1.9 oz (81.7 kg)  09/03/19 180 lb (81.6 kg)      Studies/Labs Reviewed:   EKG:  EKG is ordered today.  The ekg ordered today demonstrates atrial fibrillation at rate of 111 bpm  Recent Labs: 10/08/2018: ALT 20 06/04/2019: TSH 0.42 09/04/2019: BUN 10; Creatinine, Ser 0.92; Hemoglobin 14.0; Magnesium 1.8; Platelets 293; Potassium 3.6; Sodium 142   Lipid Panel    Component Value Date/Time   CHOL 134 06/04/2019 1122   TRIG 114.0 06/04/2019 1122   HDL 47.90 06/04/2019 1122   CHOLHDL 3 06/04/2019 1122   VLDL 22.8 06/04/2019 1122   LDLCALC 64 06/04/2019 1122   LDLDIRECT 160.2 03/01/2010 1001    Additional studies/ records that were reviewed today include:   Echocardiogram: 10/2016 Left ventricle: The cavity size was normal. Wall thickness was  normal. Systolic function was normal. The estimated ejection  fraction was in the range of 55% to 60%. Wall motion was normal;  there were no regional wall motion abnormalities. The study is  not technically sufficient to allow evaluation of LV diastolic  function.  - Mitral valve: Moderately calcified annulus. Mildly thickened  leaflets . Mild stenosis. There was trivial regurgitation. Valve  area by pressure half-time: 1.79 cm^2.  - Left atrium: Severely dilated.  - Right atrium: The atrium was mildly dilated.  - Inferior vena cava: The vessel was normal in size. The  respirophasic diameter changes were in the normal range (>= 50%),  consistent with normal central venous pressure.   Impressions:   -  LVEF 55-60%, normal  wall thickness, normal wall motion, moderate  MAC with mild stenosis and trivial MR, severe LAE, mild RAE,  normal IVC.      ASSESSMENT & PLAN:    1. Persistent atrial fibrillation Heart rate 60-120s.  Asymptomatic.  Compliant with Xarelto.  Pending echocardiogram.  Increase diltiazem 24-hour capsule to 180 mg daily.  Discussed importance of not missing any single dose of anticoagulation.  Plan cardioversion.  Risk and benefits reviewed.  2.  Hypertension -Blood pressure stable.  Increase diltiazem as above  3.  Peripheral arterial disease -Followed by Dr. Donnetta Hutching  4. HLD - Continue statin     Medication Adjustments/Labs and Tests Ordered: Current medicines are reviewed at length with the patient today.  Concerns regarding medicines are outlined above.  Medication changes, Labs and Tests ordered today are listed in the Patient Instructions below. Patient Instructions  Medication Instructions:  Your physician has recommended you make the following change in your medication:  1.  INCREASE the Cartia to 180 mg daily   *If you need a refill on your cardiac medications before your next appointment, please call your pharmacy*   Lab Work: None ordered  If you have labs (blood work) drawn today and your tests are completely normal, you will receive your results only by: [name] MyChart Message (if you have MyChart) OR . A paper copy in the mail If you have any lab test that is abnormal or we need to change your treatment, we will call you to review the results.   Testing/Procedures: Your physician has recommended that you have a Cardioversion (DCCV). Electrical Cardioversion uses a jolt of electricity to your heart either through paddles or wired patches attached to your chest. This is a controlled, usually prescheduled, procedure. Defibrillation is done under light anesthesia in the hospital, and you usually go home the day of the procedure. This is done to get your heart back into  a normal rhythm. You are not awake for the procedure. Please see the instructions BELOW:  Dear Ms. [place]lan[place] You are scheduled for a Cardioversion on 09/19/18 with Dr. Harrell Gave.  Please arrive at the [facility] (Main Entrance A) at [facility]: [place], [place] [address] at 8:00 a.m  DIET: Nothing to eat or drink after midnight except a sip of water with medications (see medication instructions below)  Medication Instructions: Hold:  SYNJARDY & LOSARTAN-HCTZ THE MORNING OF YOUR PROCEDURE             INSULIN INSTRUCTIONS:  TAKE 1/2 DOSE THE NIGHT BEFORE AND DO NOT TAKE ANY THE MORNING OF    Continue your anticoagulant: XARELTO  You will need to continue your anticoagulant after your procedure until you are told by                     your Provider that it is safe to stop   Labs: Your lab work will be done at the hospital prior to your procedure - you will need to arrive 1  hours ahead of your procedure  You must have a responsible person to drive you home and stay in the waiting area during your procedure. Failure to do so could result in cancellation.  Bring your insurance cards.  *Special Note: Every effort is made to have your procedure done on time. Occasionally there are emergencies that occur at the hospital that may cause delays. Please be patient if a delay does occur.  Follow-Up: At [facility], you and your health needs are our priority.  As part of our continuing mission to provide you with exceptional heart care, we have created designated Provider Care Teams.  These Care Teams include your primary Cardiologist (physician) and Advanced Practice Providers (APPs -  Physician Assistants and Nurse Practitioners) who all work together to provide you with the care you need, when you need it.  We recommend signing up for the patient portal called "MyChart".  Sign up information is provided on this After Visit Summary.  MyChart is used to connect with  patients for Virtual Visits (Telemedicine).  Patients are able to view lab/test results, encounter notes, upcoming appointments, etc.  Non-urgent messages can be sent to your provider as well.   To learn more about what you can do with MyChart, go to [email or web address].    Your next appointment:   2 week(s) after your procedure:  AT THE [facility]  The format for your next appointment:   In Person  Provider:   You may see [name], MD or one of the following Advanced Practice Providers on your designated Care Team:    Truitt Merle, NP  Cecilie Kicks, NP  Kathyrn Drown, NP    Other Instructions      Signed, Leanor Kail, [place]  09/10/2019 4:27 PM    [address], [place], [place]  [address] Phone: [phone]; Fax: [phone]

## 2019-09-10 NOTE — Patient Instructions (Addendum)
Medication Instructions:  Your physician has recommended you make the following change in your medication:  1.  INCREASE the Cartia to 180 mg daily   *If you need a refill on your cardiac medications before your next appointment, please call your pharmacy*   Lab Work: None ordered  If you have labs (blood work) drawn today and your tests are completely normal, you will receive your results only by: [name] MyChart Message (if you have MyChart) OR . A paper copy in the mail If you have any lab test that is abnormal or we need to change your treatment, we will call you to review the results.   Testing/Procedures: Your physician has recommended that you have a Cardioversion (DCCV). Electrical Cardioversion uses a jolt of electricity to your heart either through paddles or wired patches attached to your chest. This is a controlled, usually prescheduled, procedure. Defibrillation is done under light anesthesia in the hospital, and you usually go home the day of the procedure. This is done to get your heart back into a normal rhythm. You are not awake for the procedure. Please see the instructions BELOW:  Dear Theresa Harrington You are scheduled for a Cardioversion on 09/19/18 with Dr. Harrell Gave.  Please arrive at the [facility] (Main Entrance A) at [facility]: [place], [place] [address] at 8:00 a.m  DIET: Nothing to eat or drink after midnight except a sip of water with medications (see medication instructions below)  Medication Instructions: Hold:  SYNJARDY & LOSARTAN-HCTZ THE MORNING OF YOUR PROCEDURE             INSULIN INSTRUCTIONS:  TAKE 1/2 DOSE THE NIGHT BEFORE AND DO NOT TAKE ANY THE MORNING OF    Continue your anticoagulant: XARELTO  You will need to continue your anticoagulant after your procedure until you are told by                     your Provider that it is safe to stop   Labs: Your lab work will be done at the hospital prior to your procedure - you will  need to arrive 1  hours ahead of your procedure   YOU WILL NEED TO GO GET COVID TESTED ON Wednesday, 09/17/19 AT 12:00.  ONCE YOU ARE TESTED, YOU WILL BE ASKED TO GO HOME AND QUARANTINE (no visitors) [place] must have a responsible person to drive you home and stay in the waiting area during your procedure. Failure to do so could result in cancellation.  Bring your insurance cards.  *Special Note: Every effort is made to have your procedure done on time. Occasionally there are emergencies that occur at the hospital that may cause delays. Please be patient if a delay does occur.     Follow-Up: At [facility], you and your health needs are our priority.  As part of our continuing mission to provide you with exceptional heart care, we have created designated Provider Care Teams.  These Care Teams include your primary Cardiologist (physician) and Advanced Practice Providers (APPs -  Physician Assistants and Nurse Practitioners) who all work together to provide you with the care you need, when you need it.  We recommend signing up for the patient portal called "MyChart".  Sign up information is provided on this After Visit Summary.  MyChart is used to connect with patients for Virtual Visits (Telemedicine).  Patients are able to view lab/test results, encounter notes, upcoming appointments, etc.  Non-urgent messages can be sent to your provider as well.   To learn more about what you can do with MyChart, go to [email or web address].    Your next appointment:   2 week(s) after your procedure:  AT THE [facility]  The format for your next appointment:   In Person  Provider:   You may see [name], MD or one of the following Advanced Practice Providers on your designated Care Team:    Truitt Merle, NP  Cecilie Kicks, NP  Kathyrn Drown, NP    Other Instructions

## 2019-09-10 NOTE — H&P (View-Only) (Signed)
Cardiology Office Note    Date:  09/10/2019   ID:  Theresa Harrington, DOB Nov 03, 1955, MRN [id number]  PCP:  [name], MD  Cardiologist:  Dr. Marlou Porch  Chief Complaint: afib  follow up  History of Present Illness:   Theresa Harrington is a 64 y.o. female with history of paroxysmal atrial fibrillation, diabetes mellitus, peripheral vascular disease s/p femoropopliteal bypass in 2016, hypertension and on chronic anticoagulation seen for atrial fibrillation follow-up.  Seen by Cecilie Kicks last week for A. fib RVR which was found on EKG at PCP office.  Patient was asymptomatic.  Elevated blood pressure and placed on Bystolic by PCP.  Patient is here for follow-up.  Heart rate fluctuating between 60-120s.  Denies chest pain, shortness of breath, orthopnea, PND, syncope, lower extremity edema, palpitation.  Patient reports compliance with Xarelto.   Past Medical History:  Diagnosis Date  . Allergy   . Anxiety    Pt. denies  . Arthritis    right index figer  . Asymptomatic cholelithiasis   . Atherosclerosis of aorta ([place])   . Cataract   . Clotting disorder ([place])   . Gallstones 06/2013  . GERD (gastroesophageal reflux disease)   . History of kidney stones    lithotrispy  . Hyperlipidemia   . Hypertension   . Hypothyroidism    no meds  . Iron deficiency anemia   . PAD (peripheral artery disease) ([place])   . PAF (paroxysmal atrial fibrillation) ([place])   . Peripheral arterial occlusive disease (HCC)    lower extremities  . Pneumonia   . PONV (postoperative nausea and vomiting)   . Right ureteral stone   . Type 2 diabetes mellitus (HCC)    Type  . Vitamin B 12 deficiency   . Wears glasses     Past Surgical History:  Procedure Laterality Date  . AMPUTATION Left 07/16/2014   Procedure: LEFT SECOND TOE AMPUTATION;  Surgeon: Serafina Mitchell, MD;  Location: [facility];  Service: Vascular;  Laterality: Left;  With Nerve block  . AUGMENTATION MAMMAPLASTY    . BELPHAROPTOSIS REPAIR     eyelid  lift  . BIOPSY  10/14/2018   Procedure: BIOPSY;  Surgeon: Rush Landmark Telford Nab., MD;  Location: [place];  Service: Gastroenterology;;  . BIOPSY  08/04/2019   Procedure: BIOPSY;  Surgeon: Irving Copas., MD;  Location: [place];  Service: Gastroenterology;;  . COLONOSCOPY    . COMBINED AUGMENTATION MAMMAPLASTY AND ABDOMINOPLASTY  2009   W/  BILATERAL  THIGH LIFT  . CYSTO/  RIGHT URETERAL STENT PLACEMENT  12-30-2010  . CYSTOSCOPY WITH RETROGRADE PYELOGRAM, URETEROSCOPY AND STENT PLACEMENT Right 10/07/2013   Procedure: CYSTOSCOPY WITH RETROGRADE PYELOGRAM, right URETEROSCOPY AND STENT PLACEMENT, stone extraction;  Surgeon: Arvil Persons, MD;  [facility];  Service: Urology;  Laterality: Right;  . CYSTOSCOPY WITH STENT PLACEMENT Right 06/04/2013   Procedure: CYSTOSCOPY WITH STENT PLACEMENT;  Surgeon: Franchot Gallo, MD;  [facility]: [facility];  Service: Urology;  Laterality: Right;  . DILATATION & CURETTAGE/HYSTEROSCOPY WITH MYOSURE N/A 04/27/2015   Procedure: DILATATION & CURETTAGE/HYSTEROSCOPY WITH MYOSURE;  Surgeon: Terrance Mass, MD;  Location: [facility];  Service: Gynecology;  Laterality: N/A;  . ENDARTERECTOMY FEMORAL Left 06/08/2014   Procedure: Left Leg Common Femoral and External Iliac  Endartarectomy with patch Angioplasty;  Surgeon: Rosetta Posner, MD;  Location[facility];  Service: Vascular;  Laterality: Left;  . ESOPHAGOGASTRODUODENOSCOPY N/A 10/14/2018   Procedure: ESOPHAGOGASTRODUODENOSCOPY (EGD);  Surgeon: Justice Britain  Brooke Bonito., MD;  Location: [place];  Service: Gastroenterology;  Laterality: N/A;  . ESOPHAGOGASTRODUODENOSCOPY (EGD) WITH PROPOFOL N/A 11/06/2018   Procedure: ESOPHAGOGASTRODUODENOSCOPY (EGD) WITH PROPOFOL;  Surgeon: Rush Landmark Telford Nab., MD;  [facility];  Service: Gastroenterology;  Laterality: N/A;  RFA  . ESOPHAGOGASTRODUODENOSCOPY (EGD) WITH PROPOFOL N/A 02/12/2019   Procedure: ESOPHAGOGASTRODUODENOSCOPY (EGD) WITH  PROPOFOL;  Surgeon: Rush Landmark Telford Nab., MD;  [facility];  Service: Gastroenterology;  Laterality: N/A;  . ESOPHAGOGASTRODUODENOSCOPY (EGD) WITH PROPOFOL N/A 08/04/2019   Procedure: ESOPHAGOGASTRODUODENOSCOPY (EGD) WITH PROPOFOL;  Surgeon: Rush Landmark Telford Nab., MD;  Location: [place];  Service: Gastroenterology;  Laterality: N/A;  WITH RFA  . EXTRACORPOREAL SHOCK WAVE LITHOTRIPSY Right 08-04-2013//   06-23-2013//   01-16-2011  . EYE SURGERY Bilateral    cataract  . FEMORAL-POPLITEAL BYPASS GRAFT Left 06/08/2014   Procedure: Left Leg Femoral -Popliteal Bypass Graft;  Surgeon: Rosetta Posner, MD;  Location: [place];  Service: Vascular;  Laterality: Left;  . FEMORAL-POPLITEAL BYPASS GRAFT Left 06/09/2014   Procedure: Left Femoral and Popliteal Exposure; Left Femoral to Anterior Tibial Bypass Graft using Propaten 31m by 80cm Goretex Graft; Left Tibial Endarterectomy; Left Femoraland Popliteal Thrombectomy ;  Surgeon: VSerafina Mitchell MD;  [place]  Service: Vascular;  Laterality: Left;  . GI RADIOFREQUENCY ABLATION N/A 11/06/2018   Procedure: GI RADIOFREQUENCY ABLATION;  Surgeon: MIrving Copas, MD;  [facility];  Service: Gastroenterology;  Laterality: N/A;  . GI RADIOFREQUENCY ABLATION N/A 02/12/2019   Procedure: GI RADIOFREQUENCY ABLATION;  Surgeon: MRush LandmarkGTelford Nab, MD;  [facility];  Service: Gastroenterology;  Laterality: N/A;  . GI RADIOFREQUENCY ABLATION N/A 08/04/2019   Procedure: GI RADIOFREQUENCY ABLATION;  Surgeon: MRush LandmarkGTelford Nab, MD;  [place]  Service: Gastroenterology;  Laterality: N/A;  . HOLMIUM LASER APPLICATION Right 62/08/537  Procedure: HOLMIUM LASER APPLICATION;  Surgeon: MArvil Persons MD;  [facility]  Service: Urology;  Laterality: Right;  . KIDNEY STONE SURGERY  April 2015   1-2 stones  . LAPAROSCOPIC GASTRIC BANDING  05-29-2005  . LITHOTRIPSY  2-3 times  . LOWER  EXTREMITY ANGIOGRAM N/A 06/04/2014   Procedure: LOWER EXTREMITY ANGIOGRAM;  Surgeon: VSerafina Mitchell MD;  [facility];  Service: Cardiovascular;  Laterality: N/A;  . [place]  04-21-2002  . REVISION AND RE-SITING LAP-BAND PORT  04-08-2010   W/  UPPER EGD  . RIGHT KNEE PATELLECTOMY W/ REPAIR OF EXTENSOR MECHANISM  04-14-2002  . Toenail removed Left Jan. 21, 2016   2nd toenail-  Dr. PBarkley Bruns   Current Medications: Prior to Admission medications   Medication Sig Start Date End Date Taking? Authorizing Provider  AMBULATORY NON FORMULARY MEDICATION 931mViscous lidocaine, 9063m0mg/5ml Diyclomine, 270m43mlox Swish and Swallow 30mL67mmouth four times daily. 08/04/19  Yes Mansouraty, GabriTelford Nab  Blood Glucose Monitoring Suppl (ONETOUCH VERIO IQ SYSTEM) w/Device KIT USE TO CHECK SUGAR DAILY 10/11/18  Yes JonesJanith Lima CARTIA XT 120 MG 24 hr capsule Take 1 capsule by mouth once daily 01/21/19  Yes Skains, Mark Thana Farr dorzolamide-timolol (COSOPT) 22.3-6.8 MG/ML ophthalmic solution Place 1 drop into the right eye 2 (two) times daily.  02/05/19  Yes [provider]  Empagliflozin-metFORMIN HCl ER (SYNJARDY XR) 12.08-998 MG TB24 Take 1 tablet by mouth daily. 07/03/19  Yes JonesJanith Lima ezetimibe (ZETIA) 10 MG tablet Take 1 tablet (10 mg total) by mouth at  bedtime. 09/04/19  Yes [name], MD  glucose blood test strip Use to check blood sugar twice daily. DX: E11.8 10/23/18  Yes [name], MD  Insulin Glargine (BASAGLAR KWIKPEN) 100 UNIT/ML Inject 0.3 mLs (30 Units total) into the skin daily. 09/03/19  Yes [name], MD  insulin lispro (HUMALOG) 100 UNIT/ML injection Inject 14 Units into the skin 3 (three) times daily before meals.   Yes [provider]  Insulin Pen Needle (PEN NEEDLES) 32G X 4 MM MISC Inject 1 pen as directed 4 (four) times daily. Use to inject levemir or novolog as directed. 02/22/17  Yes [name], MD   latanoprost (XALATAN) 0.005 % ophthalmic solution Place 1 drop into both eyes at bedtime.  06/18/17  Yes [provider]  losartan-hydrochlorothiazide (HYZAAR) 100-12.5 MG tablet Take 1 tablet by mouth daily. 09/04/19  Yes [name], MD  magnesium oxide (MAG-OX) 400 MG tablet Take 1 tablet (400 mg total) by mouth at bedtime. 09/04/19  Yes [name], MD  nebivolol (BYSTOLIC) 5 MG tablet Take 1 tablet (5 mg total) by mouth daily. 09/03/19  Yes [name], MD  omeprazole (PRILOSEC) 40 MG capsule Take 1 capsule (40 mg total) by mouth daily. TAKE 1 CAPSULE BY MOUTH TWICE DAILY BEFORE A MEAL 09/04/19  Yes [name], MD  potassium chloride SA (KLOR-CON) 20 MEQ tablet Take 1 tablet (20 mEq total) by mouth daily. 09/04/19  Yes [name], MD  pravastatin (PRAVACHOL) 40 MG tablet Take 1 tablet (40 mg total) by mouth daily. 09/04/19  Yes [name], MD  rivaroxaban (XARELTO) 20 MG TABS tablet Take 1 tablet (20 mg total) by mouth daily. 09/04/19  Yes [name], MD  sucralfate (CARAFATE) 1 GM/10ML suspension Take 10 mLs (1 g total) by mouth 4 (four) times daily. 08/04/19  Yes Mansouraty, Telford Nab., MD  thiamine 100 MG tablet Take 1 tablet (100 mg total) by mouth every other day. 06/07/19  Yes [name], MD  VICTOZA 18 MG/3ML SOPN INJECT 1.8 MG INTO THE SKIN DAILY 09/10/19  Yes [name], MD    Allergies:   Amlodipine, Atorvastatin, Ampicillin, Codeine, Lisinopril, Penicillins, and Januvia [sitagliptin]   Social History   Socioeconomic History  . Marital status: Single    Spouse name: Not on file  . Number of children: Not on file  . Years of education: Not on file  . Highest education level: Not on file  Occupational History  . 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 Smoking status: Former Smoker    Packs/day: 1.00    Years: 28.00    Pack years: 28.00    Types: Cigarettes    Quit date: 09/05/2001    Years since quitting: 18.0   . Smokeless tobacco: Never Used  Substance and Sexual Activity  . Alcohol use: No    Alcohol/week: 0.0 standard drinks  . Drug use: No  . Sexual activity: Yes  Other Topics Concern  . Not on file  Social History Narrative   Regular exercise- yes   Social Determinants of Health   Financial Resource Strain: High Risk  . Difficulty of Paying Living Expenses: Hard  Food Insecurity:   . Worried About Charity fundraiser in the Last Year:   . Arboriculturist in the Last Year:   Transportation Needs:   . Film/video editor (Medical):   [name]  Lack of Transportation (Non-Medical):   Physical Activity:   . Days of Exercise per Week:   . Minutes of Exercise per Session:   Stress:   . Feeling of Stress :   Social Connections:   . Frequency of Communication with Friends and Family:   . Frequency of Social Gatherings with Friends and Family:   . Attends Religious Services:   . Active Member of Clubs or Organizations:   . Attends Archivist Meetings:   [name] Marital Status:      Family History:  The patient's family history includes Arthritis in an other family member; Cancer in her brother and another family member; Colon cancer in her maternal aunt; Deep vein thrombosis in her father and sister; Diabetes in her brother, brother, brother, father, and sister; Heart disease in her brother, father, and sister; Hyperlipidemia in her brother, father, mother, and sister; Hypertension in an other family member; Kidney disease in her mother; Other in her mother; Stroke in an other family member.   ROS:   Please see the history of present illness.    ROS All other systems reviewed and are negative.   PHYSICAL EXAM:   VS:  BP 108/70   Pulse (!) 111   Ht _0  (1.651 m)   Wt 176 lb (79.8 kg)   LMP  (LMP Unknown)   SpO2 94%   BMI 29.29 kg/m    GEN: Well nourished, well developed, in no acute distress  HEENT: normal  Neck: no JVD, carotid bruits, or masses Cardiac: Irregular  tachycardic; no murmurs, rubs, or gallops,no edema  Respiratory:  clear to auscultation bilaterally, normal work of breathing GI: soft, nontender, nondistended, + BS MS: no deformity or atrophy  Skin: warm and dry, no rash Neuro:  Alert and Oriented x 3, Strength and sensation are intact Psych: euthymic mood, full affect  Wt Readings from Last 3 Encounters:  09/10/19 176 lb (79.8 kg)  09/04/19 180 lb 1.9 oz (81.7 kg)  09/03/19 180 lb (81.6 kg)      Studies/Labs Reviewed:   EKG:  EKG is ordered today.  The ekg ordered today demonstrates atrial fibrillation at rate of 111 bpm  Recent Labs: 10/08/2018: ALT 20 06/04/2019: TSH 0.42 09/04/2019: BUN 10; Creatinine, Ser 0.92; Hemoglobin 14.0; Magnesium 1.8; Platelets 293; Potassium 3.6; Sodium 142   Lipid Panel    Component Value Date/Time   CHOL 134 06/04/2019 1122   TRIG 114.0 06/04/2019 1122   HDL 47.90 06/04/2019 1122   CHOLHDL 3 06/04/2019 1122   VLDL 22.8 06/04/2019 1122   LDLCALC 64 06/04/2019 1122   LDLDIRECT 160.2 03/01/2010 1001    Additional studies/ records that were reviewed today include:   Echocardiogram: 10/2016 Left ventricle: The cavity size was normal. Wall thickness was  normal. Systolic function was normal. The estimated ejection  fraction was in the range of 55% to 60%. Wall motion was normal;  there were no regional wall motion abnormalities. The study is  not technically sufficient to allow evaluation of LV diastolic  function.  - Mitral valve: Moderately calcified annulus. Mildly thickened  leaflets . Mild stenosis. There was trivial regurgitation. Valve  area by pressure half-time: 1.79 cm^2.  - Left atrium: Severely dilated.  - Right atrium: The atrium was mildly dilated.  - Inferior vena cava: The vessel was normal in size. The  respirophasic diameter changes were in the normal range (>= 50%),  consistent with normal central venous pressure.   Impressions:   -  LVEF 55-60%, normal  wall thickness, normal wall motion, moderate  MAC with mild stenosis and trivial MR, severe LAE, mild RAE,  normal IVC.      ASSESSMENT & PLAN:    1. Persistent atrial fibrillation Heart rate 60-120s.  Asymptomatic.  Compliant with Xarelto.  Pending echocardiogram.  Increase diltiazem 24-hour capsule to 180 mg daily.  Discussed importance of not missing any single dose of anticoagulation.  Plan cardioversion.  Risk and benefits reviewed.  2.  Hypertension -Blood pressure stable.  Increase diltiazem as above  3.  Peripheral arterial disease -Followed by Dr. Donnetta Hutching  4. HLD - Continue statin     Medication Adjustments/Labs and Tests Ordered: Current medicines are reviewed at length with the patient today.  Concerns regarding medicines are outlined above.  Medication changes, Labs and Tests ordered today are listed in the Patient Instructions below. Patient Instructions  Medication Instructions:  Your physician has recommended you make the following change in your medication:  1.  INCREASE the Cartia to 180 mg daily   *If you need a refill on your cardiac medications before your next appointment, please call your pharmacy*   Lab Work: None ordered  If you have labs (blood work) drawn today and your tests are completely normal, you will receive your results only by: [name] MyChart Message (if you have MyChart) OR . A paper copy in the mail If you have any lab test that is abnormal or we need to change your treatment, we will call you to review the results.   Testing/Procedures: Your physician has recommended that you have a Cardioversion (DCCV). Electrical Cardioversion uses a jolt of electricity to your heart either through paddles or wired patches attached to your chest. This is a controlled, usually prescheduled, procedure. Defibrillation is done under light anesthesia in the hospital, and you usually go home the day of the procedure. This is done to get your heart back into  a normal rhythm. You are not awake for the procedure. Please see the instructions BELOW:  Dear Ms. Llanas You are scheduled for a Cardioversion on 09/19/18 with Dr. Harrell Gave.  Please arrive at the [facility] (Main Entrance A) at [facility]: [place], [place] [address] at 8:00 a.m  DIET: Nothing to eat or drink after midnight except a sip of water with medications (see medication instructions below)  Medication Instructions: Hold:  SYNJARDY & LOSARTAN-HCTZ THE MORNING OF YOUR PROCEDURE             INSULIN INSTRUCTIONS:  TAKE 1/2 DOSE THE NIGHT BEFORE AND DO NOT TAKE ANY THE MORNING OF    Continue your anticoagulant: XARELTO  You will need to continue your anticoagulant after your procedure until you are told by                     your Provider that it is safe to stop   Labs: Your lab work will be done at the hospital prior to your procedure - you will need to arrive 1  hours ahead of your procedure  You must have a responsible person to drive you home and stay in the waiting area during your procedure. Failure to do so could result in cancellation.  Bring your insurance cards.  *Special Note: Every effort is made to have your procedure done on time. Occasionally there are emergencies that occur at the hospital that may cause delays. Please be patient if a delay does occur.  Follow-Up: At [facility], you and your health needs are our priority.  As part of our continuing mission to provide you with exceptional heart care, we have created designated Provider Care Teams.  These Care Teams include your primary Cardiologist (physician) and Advanced Practice Providers (APPs -  Physician Assistants and Nurse Practitioners) who all work together to provide you with the care you need, when you need it.  We recommend signing up for the patient portal called "MyChart".  Sign up information is provided on this After Visit Summary.  MyChart is used to connect with  patients for Virtual Visits (Telemedicine).  Patients are able to view lab/test results, encounter notes, upcoming appointments, etc.  Non-urgent messages can be sent to your provider as well.   To learn more about what you can do with MyChart, go to [email or web address].    Your next appointment:   2 week(s) after your procedure:  AT THE [facility]  The format for your next appointment:   In Person  Provider:   You may see [name], MD or one of the following Advanced Practice Providers on your designated Care Team:    Truitt Merle, NP  Cecilie Kicks, NP  Kathyrn Drown, NP    Other Instructions      Signed, Leanor Kail, [place]  09/10/2019 4:27 PM    [address], [place], [place]  [address] Phone: [phone]; Fax: [phone]

## 2019-09-11 ENCOUNTER — Telehealth: Payer: Self-pay

## 2019-09-11 NOTE — Telephone Encounter (Signed)
Called patient back, she had questions about patient assistance applications. Patient had completed Scientist, forensic for [employer] and Humalog and brought them in along with income information. She received Xarelto and Ozempic applications in the mail from the office, I instructed her how to complete them and return signed copies to the office to fax. She reports she did get out-of-pockets med cost list from her pharmacy and will bring that as well. Patient voiced understanding of plan.

## 2019-09-11 NOTE — Telephone Encounter (Signed)
Patient is calling and is requesting a call back from [place]. Would not go into detail about what it was in regards to. CB#: [phone]

## 2019-09-17 ENCOUNTER — Other Ambulatory Visit (HOSPITAL_COMMUNITY)
Admission: RE | Admit: 2019-09-17 | Discharge: 2019-09-17 | Disposition: A | Discharge status: Home / Self Care | Payer: PPO | Source: Ambulatory Visit | Attending: Cardiology | Admitting: Cardiology

## 2019-09-17 DIAGNOSIS — Z20822 Contact with and (suspected) exposure to covid-19: Secondary | ICD-10-CM | POA: Diagnosis not present

## 2019-09-17 DIAGNOSIS — Z01812 Encounter for preprocedural laboratory examination: Secondary | ICD-10-CM | POA: Insufficient documentation

## 2019-09-18 DIAGNOSIS — E1151 Type 2 diabetes mellitus with diabetic peripheral angiopathy without gangrene: Secondary | ICD-10-CM | POA: Diagnosis not present

## 2019-09-18 DIAGNOSIS — L89891 Pressure ulcer of other site, stage 1: Secondary | ICD-10-CM | POA: Insufficient documentation

## 2019-09-18 LAB — SARS CORONAVIRUS 2 (TAT 6-24 HRS): SARS Coronavirus 2: NEGATIVE

## 2019-09-19 ENCOUNTER — Ambulatory Visit (HOSPITAL_COMMUNITY)
Admission: RE | Admit: 2019-09-19 | Discharge: 2019-09-19 | Disposition: A | Discharge status: Home / Self Care | Payer: PPO | Source: Ambulatory Visit | Attending: Cardiology | Admitting: Cardiology

## 2019-09-19 ENCOUNTER — Ambulatory Visit (HOSPITAL_COMMUNITY): Payer: PPO | Admitting: Certified Registered"

## 2019-09-19 ENCOUNTER — Encounter (HOSPITAL_COMMUNITY)
Admission: RE | Disposition: A | Discharge status: Home / Self Care | Payer: PPO | Source: Ambulatory Visit | Attending: Cardiology

## 2019-09-19 ENCOUNTER — Encounter (HOSPITAL_COMMUNITY): Payer: Self-pay | Admitting: Cardiology

## 2019-09-19 ENCOUNTER — Telehealth: Payer: Self-pay | Admitting: Internal Medicine

## 2019-09-19 ENCOUNTER — Other Ambulatory Visit: Payer: Self-pay

## 2019-09-19 DIAGNOSIS — E538 Deficiency of other specified B group vitamins: Secondary | ICD-10-CM | POA: Insufficient documentation

## 2019-09-19 DIAGNOSIS — I4819 Other persistent atrial fibrillation: Secondary | ICD-10-CM | POA: Diagnosis not present

## 2019-09-19 DIAGNOSIS — E1151 Type 2 diabetes mellitus with diabetic peripheral angiopathy without gangrene: Secondary | ICD-10-CM | POA: Insufficient documentation

## 2019-09-19 DIAGNOSIS — I4891 Unspecified atrial fibrillation: Secondary | ICD-10-CM

## 2019-09-19 DIAGNOSIS — E1136 Type 2 diabetes mellitus with diabetic cataract: Secondary | ICD-10-CM | POA: Insufficient documentation

## 2019-09-19 DIAGNOSIS — F419 Anxiety disorder, unspecified: Secondary | ICD-10-CM | POA: Insufficient documentation

## 2019-09-19 DIAGNOSIS — I700 Atherosclerosis of aorta: Secondary | ICD-10-CM | POA: Diagnosis not present

## 2019-09-19 DIAGNOSIS — K219 Gastro-esophageal reflux disease without esophagitis: Secondary | ICD-10-CM | POA: Insufficient documentation

## 2019-09-19 DIAGNOSIS — Z87891 Personal history of nicotine dependence: Secondary | ICD-10-CM | POA: Diagnosis not present

## 2019-09-19 DIAGNOSIS — Z794 Long term (current) use of insulin: Secondary | ICD-10-CM | POA: Insufficient documentation

## 2019-09-19 DIAGNOSIS — E039 Hypothyroidism, unspecified: Secondary | ICD-10-CM | POA: Insufficient documentation

## 2019-09-19 DIAGNOSIS — E785 Hyperlipidemia, unspecified: Secondary | ICD-10-CM | POA: Diagnosis not present

## 2019-09-19 DIAGNOSIS — I10 Essential (primary) hypertension: Secondary | ICD-10-CM | POA: Insufficient documentation

## 2019-09-19 DIAGNOSIS — K2100 Gastro-esophageal reflux disease with esophagitis, without bleeding: Secondary | ICD-10-CM | POA: Diagnosis not present

## 2019-09-19 DIAGNOSIS — Z7901 Long term (current) use of anticoagulants: Secondary | ICD-10-CM | POA: Diagnosis not present

## 2019-09-19 DIAGNOSIS — Z79899 Other long term (current) drug therapy: Secondary | ICD-10-CM | POA: Insufficient documentation

## 2019-09-19 DIAGNOSIS — D509 Iron deficiency anemia, unspecified: Secondary | ICD-10-CM | POA: Insufficient documentation

## 2019-09-19 HISTORY — PX: CARDIOVERSION: SHX1299

## 2019-09-19 LAB — POCT I-STAT, CHEM 8
BUN: 16 mg/dL (ref 8–23)
Calcium, Ion: 1.26 mmol/L (ref 1.15–1.40)
Chloride: 102 mmol/L (ref 98–111)
Creatinine, Ser: 0.9 mg/dL (ref 0.44–1.00)
Glucose, Bld: 128 mg/dL — ABNORMAL HIGH (ref 70–99)
HCT: 45 % (ref 36.0–46.0)
Hemoglobin: 15.3 g/dL — ABNORMAL HIGH (ref 12.0–15.0)
Potassium: 3.2 mmol/L — ABNORMAL LOW (ref 3.5–5.1)
Sodium: 141 mmol/L (ref 135–145)
TCO2: 28 mmol/L (ref 22–32)

## 2019-09-19 SURGERY — CARDIOVERSION
Anesthesia: General

## 2019-09-19 MED ORDER — SODIUM CHLORIDE 0.9 % IV SOLN: INTRAVENOUS | Status: DC | PRN

## 2019-09-19 MED ORDER — LIDOCAINE 2% (20 MG/ML) 5 ML SYRINGE
INTRAMUSCULAR | Status: DC | PRN
  Administered 2019-09-19: 50 mg via INTRAVENOUS

## 2019-09-19 MED ORDER — PROPOFOL 10 MG/ML IV BOLUS
INTRAVENOUS | Status: DC | PRN
  Administered 2019-09-19: 60 mg via INTRAVENOUS

## 2019-09-19 NOTE — Discharge Instructions (Signed)
Electrical Cardioversion Electrical cardioversion is the delivery of a jolt of electricity to restore a normal rhythm to the heart. A rhythm that is too fast or is not regular keeps the heart from pumping well. In this procedure, sticky patches or metal paddles are placed on the chest to deliver electricity to the heart from a device. This procedure may be done in an emergency if:  There is low or no blood pressure as a result of the heart rhythm.  Normal rhythm must be restored as fast as possible to protect the brain and heart from further damage.  It may save a life. This may also be a scheduled procedure for irregular or fast heart rhythms that are not immediately life-threatening. Tell a health care provider about:  Any allergies you have.  All medicines you are taking, including vitamins, herbs, eye drops, creams, and over-the-counter medicines.  Any problems you or family members have had with anesthetic medicines.  Any blood disorders you have.  Any surgeries you have had.  Any medical conditions you have.  Whether you are pregnant or may be pregnant. What are the risks? Generally, this is a safe procedure. However, problems may occur, including:  Allergic reactions to medicines.  A blood clot that breaks free and travels to other parts of your body.  The possible return of an abnormal heart rhythm within hours or days after the procedure.  Your heart stopping (cardiac arrest). This is rare. What happens before the procedure? Medicines  Your health care provider may have you start taking: ? Blood-thinning medicines (anticoagulants) so your blood does not clot as easily. ? Medicines to help stabilize your heart rate and rhythm.  Ask your health care provider about: ? Changing or stopping your regular medicines. This is especially important if you are taking diabetes medicines or blood thinners. ? Taking medicines such as aspirin and ibuprofen. These medicines can  thin your blood. Do not take these medicines unless your health care provider tells you to take them. ? Taking over-the-counter medicines, vitamins, herbs, and supplements. General instructions  Follow instructions from your health care provider about eating or drinking restrictions.  Plan to have someone take you home from the hospital or clinic.  If you will be going home right after the procedure, plan to have someone with you for 24 hours.  Ask your health care provider what steps will be taken to help prevent infection. These may include washing your skin with a germ-killing soap. What happens during the procedure?   An IV will be inserted into one of your veins.  Sticky patches (electrodes) or metal paddles may be placed on your chest.  You will be given a medicine to help you relax (sedative).  An electrical shock will be delivered. The procedure may vary among health care providers and hospitals. What can I expect after the procedure?  Your blood pressure, heart rate, breathing rate, and blood oxygen level will be monitored until you leave the hospital or clinic.  Your heart rhythm will be watched to make sure it does not change.  You may have some redness on the skin where the shocks were given. Follow these instructions at home:  Do not drive for 24 hours if you were given a sedative during your procedure.  Take over-the-counter and prescription medicines only as told by your health care provider.  Ask your health care provider how to check your pulse. Check it often.  Rest for 48 hours after the procedure or   as told by your health care provider.  Avoid or limit your caffeine use as told by your health care provider.  Keep all follow-up visits as told by your health care provider. This is important. Contact a health care provider if:  You feel like your heart is beating too quickly or your pulse is not regular.  You have a serious muscle cramp that does not go  away. Get help right away if:  You have discomfort in your chest.  You are dizzy or you feel faint.  You have trouble breathing or you are short of breath.  Your speech is slurred.  You have trouble moving an arm or leg on one side of your body.  Your fingers or toes turn cold or blue. Summary  Electrical cardioversion is the delivery of a jolt of electricity to restore a normal rhythm to the heart.  This procedure may be done right away in an emergency or may be a scheduled procedure if the condition is not an emergency.  Generally, this is a safe procedure.  After the procedure, check your pulse often as told by your health care provider. This information is not intended to replace advice given to you by your health care provider. Make sure you discuss any questions you have with your health care provider. Document Revised: 11/18/2018 Document Reviewed: 11/18/2018 Elsevier Patient Education  2020 [employer]  

## 2019-09-19 NOTE — Anesthesia Preprocedure Evaluation (Signed)
Anesthesia Evaluation  Patient identified by MRN, date of birth, ID band Patient awake    Reviewed: Allergy & Precautions, NPO status , Patient's Chart, lab work & pertinent test results, reviewed documented beta blocker date and time   History of Anesthesia Complications (+) PONV and history of anesthetic complications  Airway Mallampati: II  TM Distance: >3 FB Neck ROM: Full    Dental no notable dental hx. (+) Teeth Intact   Pulmonary pneumonia, resolved, former smoker,    Pulmonary exam normal breath sounds clear to auscultation       Cardiovascular hypertension, Pt. on medications and Pt. on home beta blockers + Peripheral Vascular Disease  Normal cardiovascular exam Rhythm:Irregular Rate:Normal     Neuro/Psych Anxiety Peripheral neuropathy Glaucoma  Neuromuscular disease    GI/Hepatic GERD  Medicated and Controlled,  Endo/Other  diabetes, Poorly Controlled, Type 2, Insulin Dependent, Oral Hypoglycemic AgentsHypothyroidism Hyperlipidemia  Renal/GU Renal diseaseRight ureteral calculus  negative genitourinary   Musculoskeletal  (+) Arthritis , Osteoarthritis,    Abdominal   Peds  Hematology  (+) anemia , Xarelto therapy - last dose this am   Anesthesia Other Findings   Reproductive/Obstetrics                             Anesthesia Physical Anesthesia Plan  ASA: III  Anesthesia Plan: General   Post-op Pain Management:    Induction: Intravenous  PONV Risk Score and Plan: 4 or greater and Ondansetron and Treatment may vary due to age or medical condition  Airway Management Planned: Mask  Additional Equipment:   Intra-op Plan:   Post-operative Plan: Extubation in OR  Informed Consent: I have reviewed the patients History and Physical, chart, labs and discussed the procedure including the risks, benefits and alternatives for the proposed anesthesia with the patient or  authorized representative who has indicated his/her understanding and acceptance.     Dental advisory given  Plan Discussed with: CRNA and Surgeon  Anesthesia Plan Comments:         Anesthesia Quick Evaluation

## 2019-09-19 NOTE — Telephone Encounter (Signed)
    [facility] calling to request refill for Xarelto be sent to them at fax # [phone], phone [phone] Patient will have cheaper cost by using [facility]

## 2019-09-19 NOTE — Transfer of Care (Signed)
Immediate Anesthesia Transfer of Care Note  Patient: Theresa Harrington  Procedure(s) Performed: CARDIOVERSION (N/A )  Patient Location: Endoscopy Unit  Anesthesia Type:General  Level of Consciousness: drowsy  Airway & Oxygen Therapy: Patient Spontanous Breathing  Post-op Assessment: Report given to RN  Post vital signs: Reviewed and stable  Last Vitals:  Vitals Value Taken Time  BP    Temp    Pulse    Resp    SpO2      Last Pain:  Vitals:   09/19/19 0927  TempSrc: Oral  PainSc: 0-No pain         Complications: No apparent anesthesia complications

## 2019-09-19 NOTE — CV Procedure (Signed)
Procedure:   DCCV  Indication:  Symptomatic atrial fibrillation  Procedure Note:  The patient signed informed consent.  They have had had therapeutic anticoagulation with rivaroxaban greater than 3 weeks.  Anesthesia was administered by Dr. Royce Macadamia.  Patient received 50 mg IV lidocaine and 60 mg IV propofol.Adequate airway was maintained throughout and vital followed per protocol.  They were cardioverted x 1 with 120J of biphasic synchronized energy.  They converted to NSR.  There were no apparent complications.  The patient had normal neuro status and respiratory status post procedure with vitals stable as recorded elsewhere.    Follow up:  They will continue on current medical therapy and follow up with cardiology as scheduled.  Buford Dresser, MD PhD 09/19/2019 10:01 AM

## 2019-09-19 NOTE — Interval H&P Note (Signed)
History and Physical Interval Note:  09/19/2019 9:41 AM  Theresa Harrington  has presented today for surgery, with the diagnosis of AFIB.  The various methods of treatment have been discussed with the patient and family. After consideration of risks, benefits and other options for treatment, the patient has consented to  Procedure(s): CARDIOVERSION (N/A) as a surgical intervention.  The patient's history has been reviewed, patient examined, no change in status, stable for surgery.  I have reviewed the patient's chart and labs.  Questions were answered to the patient's satisfaction.     Dhaval Woo Harrell Gave

## 2019-09-19 NOTE — Anesthesia Postprocedure Evaluation (Signed)
Anesthesia Post Note  Patient: Theresa Harrington  Procedure(s) Performed: CARDIOVERSION (N/A )     Patient location during evaluation: PACU Anesthesia Type: General Level of consciousness: awake and alert and oriented Pain management: pain level controlled Vital Signs Assessment: post-procedure vital signs reviewed and stable Respiratory status: spontaneous breathing, nonlabored ventilation and respiratory function stable Cardiovascular status: blood pressure returned to baseline and stable Postop Assessment: no apparent nausea or vomiting Anesthetic complications: no    Last Vitals:  Vitals:   09/19/19 1005 09/19/19 1010  BP: 134/65 133/77  Pulse: (!) 56 (!) 58  Resp: 13 13  Temp: 36.5 C   SpO2: 100% 99%    Last Pain:  Vitals:   09/19/19 1010  TempSrc:   PainSc: 0-No pain                 [name] A.

## 2019-09-22 ENCOUNTER — Telehealth: Payer: Self-pay

## 2019-09-22 MED ORDER — RIVAROXABAN 20 MG PO TABS: 20.0000 mg | ORAL_TABLET | Freq: Every day | ORAL | 1 refills | Status: DC

## 2019-09-22 NOTE — Telephone Encounter (Signed)
New message    [facility] calling    Direction clarification on medication  omeprazole (PRILOSEC) 40 MG capsule

## 2019-09-22 NOTE — Telephone Encounter (Signed)
I was not able to find the pharmacy.  Rx has been printed, signed and faxed.

## 2019-09-23 MED ORDER — OMEPRAZOLE 40 MG PO CPDR: 40.0000 mg | DELAYED_RELEASE_CAPSULE | Freq: Two times a day (BID) | ORAL | 1 refills | Status: DC

## 2019-09-23 NOTE — Telephone Encounter (Signed)
Spoke to [facility] and confirmed the directions for the omeprazole. 1 capsule bid.

## 2019-09-24 ENCOUNTER — Ambulatory Visit (HOSPITAL_COMMUNITY): Payer: PPO | Attending: Cardiology

## 2019-09-24 ENCOUNTER — Other Ambulatory Visit: Payer: Self-pay

## 2019-09-24 DIAGNOSIS — I480 Paroxysmal atrial fibrillation: Secondary | ICD-10-CM | POA: Diagnosis not present

## 2019-09-24 MED ORDER — PERFLUTREN LIPID MICROSPHERE
1.0000 mL | INTRAVENOUS | Status: AC | PRN
  Administered 2019-09-24: 1 mL via INTRAVENOUS

## 2019-09-25 ENCOUNTER — Ambulatory Visit: Payer: PPO | Admitting: Cardiology

## 2019-09-30 ENCOUNTER — Telehealth: Payer: Self-pay | Admitting: *Deleted

## 2019-09-30 ENCOUNTER — Ambulatory Visit: Payer: Self-pay | Admitting: Pharmacist

## 2019-09-30 ENCOUNTER — Telehealth: Payer: PPO

## 2019-09-30 DIAGNOSIS — I4891 Unspecified atrial fibrillation: Secondary | ICD-10-CM

## 2019-09-30 DIAGNOSIS — IMO0002 Reserved for concepts with insufficient information to code with codable children: Secondary | ICD-10-CM

## 2019-09-30 NOTE — Telephone Encounter (Signed)
-----   Message from Isaiah Serge, NP sent at 09/25/2019  8:20 AM EDT ----- Let pt know echo is with good pump action and her mitral valve had some tightness now with moderate tightness will recheck in 1 year.  But should not cause her problems now or over next year.  We will just keep an eye on it.

## 2019-09-30 NOTE — Chronic Care Management (AMB) (Signed)
  Chronic Care Management   Outreach Note  09/30/2019 Name: Theresa Harrington MRN: [id number] DOB: October 22, 1955  Referred by: [name], MD Reason for referral : Chronic Care Management and Medication Management  Received call from patient regarding Victoza - she is out and it will be over $200 to refill due to donut hole. The PAP is in process for Ozempic - contacted [employer] and they needed provider info re-faxed due to ineligible page. Re-sent necessary pages to expedite application process.  She is also asking about Xarelto copay - she received a 30 day supply from the pharmacy for $120 and a separate 30 day supply from [employer] for $80. I informed her the $80 is part of [employer] assistance program and does not go through insurance, I recommend she continue getting Xarelto through the company while we wait for the full PAP to be approved.  Plan: Follow up Ozempic PAP approval within 3 days

## 2019-10-01 ENCOUNTER — Ambulatory Visit (HOSPITAL_COMMUNITY): Payer: PPO | Admitting: Nurse Practitioner

## 2019-10-02 ENCOUNTER — Other Ambulatory Visit: Payer: Self-pay

## 2019-10-02 ENCOUNTER — Encounter (HOSPITAL_COMMUNITY): Payer: Self-pay | Admitting: Nurse Practitioner

## 2019-10-02 ENCOUNTER — Ambulatory Visit (HOSPITAL_COMMUNITY)
Admission: RE | Admit: 2019-10-02 | Discharge: 2019-10-02 | Disposition: A | Discharge status: Home / Self Care | Payer: PPO | Source: Ambulatory Visit | Attending: Nurse Practitioner | Admitting: Nurse Practitioner

## 2019-10-02 VITALS — BP 120/60 | HR 88 | Ht 65.0 in | Wt 175.0 lb

## 2019-10-02 DIAGNOSIS — I4819 Other persistent atrial fibrillation: Secondary | ICD-10-CM

## 2019-10-02 DIAGNOSIS — Z79899 Other long term (current) drug therapy: Secondary | ICD-10-CM | POA: Insufficient documentation

## 2019-10-02 DIAGNOSIS — D6869 Other thrombophilia: Secondary | ICD-10-CM | POA: Diagnosis not present

## 2019-10-02 DIAGNOSIS — Z8249 Family history of ischemic heart disease and other diseases of the circulatory system: Secondary | ICD-10-CM | POA: Insufficient documentation

## 2019-10-02 DIAGNOSIS — E118 Type 2 diabetes mellitus with unspecified complications: Secondary | ICD-10-CM | POA: Diagnosis not present

## 2019-10-02 DIAGNOSIS — Z794 Long term (current) use of insulin: Secondary | ICD-10-CM | POA: Insufficient documentation

## 2019-10-02 DIAGNOSIS — Z87891 Personal history of nicotine dependence: Secondary | ICD-10-CM | POA: Diagnosis not present

## 2019-10-02 DIAGNOSIS — K219 Gastro-esophageal reflux disease without esophagitis: Secondary | ICD-10-CM | POA: Diagnosis not present

## 2019-10-02 DIAGNOSIS — E785 Hyperlipidemia, unspecified: Secondary | ICD-10-CM | POA: Diagnosis not present

## 2019-10-02 DIAGNOSIS — E039 Hypothyroidism, unspecified: Secondary | ICD-10-CM | POA: Diagnosis not present

## 2019-10-02 NOTE — Progress Notes (Signed)
Primary Care Physician: [name], MD Referring Physician: Jonelle Sidle, [place] Cardiologist: Dr. Jannett Celestine Louks is a 64 y.o. female with a h/o paroxysmal afib, fem-pop bypass 06/08/14, DM, HTN. She was recently seen by [facility] PA, who set pt up DCCV for asymptomatic afib found at PCP office . She was started on bystolic 5 mg qd by PCP. She is now in the afib clinic for f/u of cardioversion. She had successful cardioversion but unfortunately is now back in rate controlled afib. The pt states that she is never aware when she is in afib vrs NSR. She  feels well. She had an echo 5/21 that showed moderate mitral stenosis, indicating valvular afib. Her left atrium was reported as mildly dilated but it appears enlarged with  LA diam at 4.9 cm and volume at 64.1. She  has not seen Dr. Marlou Porch since 2019 as she cancelled her appointment last year.   Today, she denies symptoms of palpitations, chest pain, shortness of breath, orthopnea, PND, lower extremity edema, dizziness, presyncope, syncope, or neurologic sequela. The patient is tolerating medications without difficulties and is otherwise without complaint today.   Past Medical History:  Diagnosis Date  . Allergy   . Anxiety    Pt. denies  . Arthritis    right index figer  . Asymptomatic cholelithiasis   . Atherosclerosis of aorta ([place])   . Cataract   . Clotting disorder ([place])   . Gallstones 06/2013  . GERD (gastroesophageal reflux disease)   . History of kidney stones    lithotrispy  . Hyperlipidemia   . Hypertension   . Hypothyroidism    no meds  . Iron deficiency anemia   . PAD (peripheral artery disease) ([place])   . PAF (paroxysmal atrial fibrillation) ([place])   . Peripheral arterial occlusive disease (HCC)    lower extremities  . Pneumonia   . PONV (postoperative nausea and vomiting)   . Right ureteral stone   . Type 2 diabetes mellitus (HCC)    Type  . Vitamin B 12 deficiency   . Wears glasses    Past Surgical History:   Procedure Laterality Date  . AMPUTATION Left 07/16/2014   Procedure: LEFT SECOND TOE AMPUTATION;  Surgeon: Serafina Mitchell, MD;  Location: [place];  Service: Vascular;  Laterality: Left;  With Nerve block  . AUGMENTATION MAMMAPLASTY    . BELPHAROPTOSIS REPAIR     eyelid lift  . BIOPSY  10/14/2018   Procedure: BIOPSY;  Surgeon: Rush Landmark Telford Nab., MD;  Location: [place];  Service: Gastroenterology;;  . BIOPSY  08/04/2019   Procedure: BIOPSY;  Surgeon: Irving Copas., MD;  Location: [place];  Service: Gastroenterology;;  . CARDIOVERSION N/A 09/19/2019   Procedure: CARDIOVERSION;  Surgeon: Buford Dresser, MD;  Location: [facility] ENDOSCOPY;  Service: Cardiovascular;  Laterality: N/A;  . COLONOSCOPY    . COMBINED AUGMENTATION MAMMAPLASTY AND ABDOMINOPLASTY  2009   W/  BILATERAL  THIGH LIFT  . CYSTO/  RIGHT URETERAL STENT PLACEMENT  12-30-2010  . CYSTOSCOPY WITH RETROGRADE PYELOGRAM, URETEROSCOPY AND STENT PLACEMENT Right 10/07/2013   Procedure: CYSTOSCOPY WITH RETROGRADE PYELOGRAM, right URETEROSCOPY AND STENT PLACEMENT, stone extraction;  Surgeon: Arvil Persons, MD;  Location: [facility];  Service: Urology;  Laterality: Right;  . CYSTOSCOPY WITH STENT PLACEMENT Right 06/04/2013   Procedure: CYSTOSCOPY WITH STENT PLACEMENT;  Surgeon: Franchot Gallo, MD;  Location: [facility];  Service: Urology;  Laterality: Right;  . DILATATION & CURETTAGE/HYSTEROSCOPY WITH MYOSURE N/A  04/27/2015   Procedure: DILATATION & CURETTAGE/HYSTEROSCOPY WITH MYOSURE;  Surgeon: Terrance Mass, MD;  Location: [facility];  Service: Gynecology;  Laterality: N/A;  . ENDARTERECTOMY FEMORAL Left 06/08/2014   Procedure: Left Leg Common Femoral and External Iliac  Endartarectomy with patch Angioplasty;  Surgeon: Rosetta Posner, MD;  Location: [facility] OR;  Service: Vascular;  Laterality: Left;  . ESOPHAGOGASTRODUODENOSCOPY N/A 10/14/2018   Procedure: ESOPHAGOGASTRODUODENOSCOPY (EGD);  Surgeon: Irving Copas., MD;  Location: [place];  Service: Gastroenterology;  Laterality: N/A;  . ESOPHAGOGASTRODUODENOSCOPY (EGD) WITH PROPOFOL N/A 11/06/2018   Procedure: ESOPHAGOGASTRODUODENOSCOPY (EGD) WITH PROPOFOL;  Surgeon: Rush Landmark Telford Nab., MD;  [facility];  Service: Gastroenterology;  Laterality: N/A;  RFA  . ESOPHAGOGASTRODUODENOSCOPY (EGD) WITH PROPOFOL N/A 02/12/2019   Procedure: ESOPHAGOGASTRODUODENOSCOPY (EGD) WITH PROPOFOL;  Surgeon: Rush Landmark Telford Nab., MD;  Location: [facility];  Service: Gastroenterology;  Laterality: N/A;  . ESOPHAGOGASTRODUODENOSCOPY (EGD) WITH PROPOFOL N/A 08/04/2019   Procedure: ESOPHAGOGASTRODUODENOSCOPY (EGD) WITH PROPOFOL;  Surgeon: Rush Landmark Telford Nab., MD;  Location: [place];  Service: Gastroenterology;  Laterality: N/A;  WITH RFA  . EXTRACORPOREAL SHOCK WAVE LITHOTRIPSY Right 08-04-2013//   06-23-2013//   01-16-2011  . EYE SURGERY Bilateral    cataract  . FEMORAL-POPLITEAL BYPASS GRAFT Left 06/08/2014   Procedure: Left Leg Femoral -Popliteal Bypass Graft;  Surgeon: Rosetta Posner, MD;  Location: [place];  Service: Vascular;  Laterality: Left;  . FEMORAL-POPLITEAL BYPASS GRAFT Left 06/09/2014   Procedure: Left Femoral and Popliteal Exposure; Left Femoral to Anterior Tibial Bypass Graft using Propaten 33m by 80cm Goretex Graft; Left Tibial Endarterectomy; Left Femoraland Popliteal Thrombectomy ;  Surgeon: VSerafina Mitchell MD;  [place]  Service: Vascular;  Laterality: Left;  . GI RADIOFREQUENCY ABLATION N/A 11/06/2018   Procedure: GI RADIOFREQUENCY ABLATION;  Surgeon: MIrving Copas, MD;  [facility];  Service: Gastroenterology;  Laterality: N/A;  . GI RADIOFREQUENCY ABLATION N/A 02/12/2019   Procedure: GI RADIOFREQUENCY ABLATION;  Surgeon: MRush LandmarkGTelford Nab, MD;  [facility];  Service: Gastroenterology;  Laterality: N/A;  . GI RADIOFREQUENCY ABLATION N/A 08/04/2019   Procedure: GI RADIOFREQUENCY ABLATION;   Surgeon: MRush LandmarkGTelford Nab, MD;  [place]  Service: Gastroenterology;  Laterality: N/A;  . HOLMIUM LASER APPLICATION Right 64/0/3524  Procedure: HOLMIUM LASER APPLICATION;  Surgeon: MArvil Persons MD;  [facility]  Service: Urology;  Laterality: Right;  . KIDNEY STONE SURGERY  April 2015   1-2 stones  . LAPAROSCOPIC GASTRIC BANDING  05-29-2005  . LITHOTRIPSY  2-3 times  . LOWER EXTREMITY ANGIOGRAM N/A 06/04/2014   Procedure: LOWER EXTREMITY ANGIOGRAM;  Surgeon: VSerafina Mitchell MD;  [facility];  Service: Cardiovascular;  Laterality: N/A;  . ORIF FIFTH METACARPAL [place] RIGHT HAND  04-21-2002  . REVISION AND RE-SITING LAP-BAND PORT  04-08-2010   W/  UPPER EGD  . RIGHT KNEE PATELLECTOMY W/ REPAIR OF EXTENSOR MECHANISM  04-14-2002  . Toenail removed Left Jan. 21, 2016   2nd toenail-  Dr. PBarkley Bruns   Current Outpatient Medications  Medication Sig Dispense Refill  . AMBULATORY NON FORMULARY MEDICATION 958mViscous lidocaine, 9024m0mg/5ml Diyclomine, 270m40mlox Swish and Swallow 30mL25mmouth four times daily. 550 mL 1  . Blood Glucose Monitoring Suppl (ONETOUCH VERIO IQ SYSTEM) w/Device KIT USE TO CHECK SUGAR DAILY 1 kit 0  . diltiazem (CARDIZEM CD) 180 MG 24 hr capsule Take 1 capsule (180 mg total) by mouth daily. 90 capsule 3  .  dorzolamide-timolol (COSOPT) 22.3-6.8 MG/ML ophthalmic solution Place 1 drop into the right eye 2 (two) times daily.     . Empagliflozin-metFORMIN HCl ER (SYNJARDY XR) 12.08-998 MG TB24 Take 1 tablet by mouth daily. 140 tablet 0  . ezetimibe (ZETIA) 10 MG tablet Take 1 tablet (10 mg total) by mouth at bedtime. 90 tablet 1  . glucose blood test strip Use to check blood sugar twice daily. DX: E11.8 200 each 3  . Insulin Glargine (BASAGLAR KWIKPEN) 100 UNIT/ML Inject 0.3 mLs (30 Units total) into the skin daily. 15 mL 1  . insulin lispro (HUMALOG) 100 UNIT/ML injection Inject 14 Units into the skin 3 (three) times daily  before meals.    . Insulin Pen Needle (PEN NEEDLES) 32G X 4 MM MISC Inject 1 pen as directed 4 (four) times daily. Use to inject levemir or novolog as directed. 400 each 3  . latanoprost (XALATAN) 0.005 % ophthalmic solution Place 1 drop into both eyes at bedtime.     [name] losartan-hydrochlorothiazide (HYZAAR) 100-12.5 MG tablet Take 1 tablet by mouth daily. 90 tablet 1  . magnesium oxide (MAG-OX) 400 MG tablet Take 1 tablet (400 mg total) by mouth at bedtime. 90 tablet 1  . nebivolol (BYSTOLIC) 5 MG tablet Take 1 tablet (5 mg total) by mouth daily. 84 tablet 0  . omeprazole (PRILOSEC) 40 MG capsule Take 1 capsule (40 mg total) by mouth 2 (two) times daily before a meal. 180 capsule 1  . potassium chloride SA (KLOR-CON) 20 MEQ tablet Take 1 tablet (20 mEq total) by mouth daily. 90 tablet 1  . pravastatin (PRAVACHOL) 40 MG tablet Take 1 tablet (40 mg total) by mouth daily. 90 tablet 1  . rivaroxaban (XARELTO) 20 MG TABS tablet Take 1 tablet (20 mg total) by mouth daily. 90 tablet 1  . thiamine 100 MG tablet Take 1 tablet (100 mg total) by mouth every other day. 45 tablet 1  . VICTOZA 18 MG/3ML SOPN INJECT 1.8 MG INTO THE SKIN DAILY (Patient taking differently: Inject 1.8 mg into the skin daily. ) 9 mL 0   No current facility-administered medications for this encounter.    Allergies  Allergen Reactions  . Amlodipine Swelling  . Atorvastatin Other (See Comments)    Muscle aches   . Ampicillin Nausea And Vomiting and Other (See Comments)  . Codeine Nausea And Vomiting    Ask patient  . Lisinopril Cough       . Penicillins Nausea And Vomiting    Did it involve swelling of the face/tongue/throat, SOB, or low BP? No Did it involve sudden or severe rash/hives, skin peeling, or any reaction on the inside of your mouth or nose? No Did you need to seek medical attention at a hospital or doctor's office? No When did it last happen?20+ years If all above answers are "NO", may proceed with  cephalosporin use.   [name] [Sitagliptin] Nausea And Vomiting    Social History   Socioeconomic History  . Marital status: Single    Spouse name: Not on file  . Number of children: Not on file  . Years of education: Not on file  . Highest education level: Not on file  Occupational History  . 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 Smoking status: Former Smoker    Packs/day: 1.00    Years: 28.00    Pack years: 28.00    Types: Cigarettes    Quit date: 09/05/2001  Years since quitting: 18.0  . Smokeless tobacco: Never Used  Substance and Sexual Activity  . Alcohol use: No    Alcohol/week: 0.0 standard drinks  . Drug use: No  . Sexual activity: Yes  Other Topics Concern  . Not on file  Social History Narrative   Regular exercise- yes   Social Determinants of Health   Financial Resource Strain: High Risk  . Difficulty of Paying Living Expenses: Hard  Food Insecurity:   . Worried About Charity fundraiser in the Last Year:   . Arboriculturist in the Last Year:   Transportation Needs:   . Film/video editor (Medical):   [name] Lack of Transportation (Non-Medical):   Physical Activity:   . Days of Exercise per Week:   . Minutes of Exercise per Session:   Stress:   . Feeling of Stress :   Social Connections:   . Frequency of Communication with Friends and Family:   . Frequency of Social Gatherings with Friends and Family:   . Attends Religious Services:   . Active Member of Clubs or Organizations:   . Attends Archivist Meetings:   [name] Marital Status:   Intimate Partner Violence:   . Fear of Current or Ex-Partner:   . Emotionally Abused:   [name] Physically Abused:   . Sexually Abused:     Family History  Problem Relation Age of Onset  . Diabetes Father   . Heart disease Father   . Deep vein thrombosis Father   . Hyperlipidemia Father   . Diabetes Sister   . Heart disease Sister   . Deep vein thrombosis Sister   .  Hyperlipidemia Sister   . Diabetes Brother   . Heart disease Brother   . Cancer Brother   . Hyperlipidemia Brother   . Colon cancer Maternal Aunt   . Diabetes Brother   . Diabetes Brother   . Kidney disease Mother   . Hyperlipidemia Mother   . Other Mother        AAA   and    Amputation  . Arthritis Other   . Cancer Other        colon  . Hypertension Other   . Stroke Other   . Esophageal cancer Neg Hx   . Stomach cancer Neg Hx   . Inflammatory bowel disease Neg Hx   . Liver disease Neg Hx   . Pancreatic cancer Neg Hx     ROS- All systems are reviewed and negative except as per the HPI above  Physical Exam: Vitals:   10/02/19 0907  BP: 120/60  Pulse: 88  Weight: 79.4 kg  Height: '5\' 5"'$  (1.651 m)   Wt Readings from Last 3 Encounters:  10/02/19 79.4 kg  09/19/19 80.3 kg  09/10/19 79.8 kg    Labs: Lab Results  Component Value Date   NA 141 09/19/2019   K 3.2 (L) 09/19/2019   CL 102 09/19/2019   CO2 23 09/04/2019   GLUCOSE 128 (H) 09/19/2019   BUN 16 09/19/2019   CREATININE 0.90 09/19/2019   CALCIUM 9.6 09/04/2019   MG 1.8 09/04/2019   Lab Results  Component Value Date   INR 1.05 07/16/2014   Lab Results  Component Value Date   CHOL 134 06/04/2019   HDL 47.90 06/04/2019   LDLCALC 64 06/04/2019   TRIG 114.0 06/04/2019     GEN- The patient is well appearing, alert and oriented x 3 today.   Head- normocephalic,  atraumatic Eyes-  Sclera clear, conjunctiva pink Ears- hearing intact Oropharynx- clear Neck- supple, no JVP Lymph- no cervical lymphadenopathy Lungs- Clear to ausculation bilaterally, normal work of breathing Heart- irregular rate and rhythm, no murmurs, rubs or gallops, PMI not laterally displaced GI- soft, NT, ND, + BS Extremities- no clubbing, cyanosis, or edema MS- no significant deformity or atrophy Skin- no rash or lesion Psych- euthymic mood, full affect Neuro- strength and sensation are intact  EKG-afib at 88 bpm  Echo-  5/261. Left ventricular ejection fraction, by estimation, is 60 to 65%. The  left ventricle has normal function. The left ventricle has no regional  wall motion abnormalities. Left ventricular diastolic parameters are  consistent with Grade III diastolic  dysfunction (restrictive). Elevated left atrial pressure.  2. Right ventricular systolic function is normal. The right ventricular  size is mildly enlarged. Tricuspid regurgitation signal is inadequate for  assessing PA pressure.  3. Left atrial size was mildly dilated.  4. The mitral valve is abnormal. Severe mitral annular calcification. No  evidence of mitral valve regurgitation. Moderate mitral stenosis (MG 24mHG  at HR 57 bpm, MVA 1.1 cm^2 by continuiy equation)  5. The aortic valve is tricuspid. Aortic valve regurgitation is not  visualized. Mild to moderate aortic valve sclerosis/calcification is  present, without any evidence of aortic stenosis.  6. Aortic dilatation noted. There is mild dilatation of the ascending  aorta measuring 36 mm.  7. The inferior vena cava is normal in size with greater than 50%  respiratory variability, suggesting right atrial pressure of 3 mmHg.   Assessment and Plan: 1. Persistent afib /valvular afib  Asymptomatic Successful cardioversion but ERAF Updated echo 5/26 shows moderate mitral stenosis/vavular afib Raises the question if the valve is driving the afib and if further valvular  w/u with TEE is needed  She is rate controlled and will continue bystolic at 5 mg qd  2. CHA2DS2VASc score of at least 4 For now if not severe Mitral stenosis,  she can remain on xarelto 20 mg daily   For now I will not suggest any changes in approach I would like Dr. SGae Gallopto review my note/echo and consider if further w/u with TEE is warranted  If SR is to be pursued, with her structural heart disease, Tikosyn appears to be her best option and I will be happy to assist with that   DElim Devesh Monforte,  [facility] [phone]

## 2019-10-03 ENCOUNTER — Ambulatory Visit: Payer: PPO | Admitting: Cardiology

## 2019-10-09 ENCOUNTER — Encounter: Payer: Self-pay | Admitting: Cardiology

## 2019-10-09 ENCOUNTER — Ambulatory Visit: Payer: PPO | Admitting: Cardiology

## 2019-10-09 ENCOUNTER — Other Ambulatory Visit: Payer: Self-pay

## 2019-10-09 VITALS — BP 100/60 | HR 65 | Ht 65.0 in | Wt 171.0 lb

## 2019-10-09 DIAGNOSIS — I342 Nonrheumatic mitral (valve) stenosis: Secondary | ICD-10-CM

## 2019-10-09 NOTE — Progress Notes (Signed)
Cardiology Office Note:    Date:  10/09/2019   ID:  Deshanta Lady, DOB June 14, 1955, MRN [id number]  PCP:  [name], MD  [facility] Cardiologist:  [name], MD  [facility] Electrophysiologist:  None   Referring MD: [name], MD     History of Present Illness:    Theresa Harrington is a 64 y.o. female here for the follow-up of paroxysmal atrial fibrillation peripheral arterial disease with femoropopliteal bypass in 2016 diabetes with hypertension.  Had cardioversion but was asymptomatic.  Ended up going back into rate controlled atrial fibrillation.  Never apparent to her.  Had moderate mitral stenosis previously on echocardiogram-could indicate valvular A. fib if severe.  Left atrial diameter 4.9.  We had lengthy discussion today.  Past Medical History:  Diagnosis Date  . Allergy   . Anxiety    Pt. denies  . Arthritis    right index figer  . Asymptomatic cholelithiasis   . Atherosclerosis of aorta ([place])   . Cataract   . Clotting disorder ([place])   . Gallstones 06/2013  . GERD (gastroesophageal reflux disease)   . History of kidney stones    lithotrispy  . Hyperlipidemia   . Hypertension   . Hypothyroidism    no meds  . Iron deficiency anemia   . PAD (peripheral artery disease) ([place])   . PAF (paroxysmal atrial fibrillation) ([place])   . Peripheral arterial occlusive disease (HCC)    lower extremities  . Pneumonia   . PONV (postoperative nausea and vomiting)   . Right ureteral stone   . Type 2 diabetes mellitus (HCC)    Type  . Vitamin B 12 deficiency   . Wears glasses     Past Surgical History:  Procedure Laterality Date  . AMPUTATION Left 07/16/2014   Procedure: LEFT SECOND TOE AMPUTATION;  Surgeon: Serafina Mitchell, MD;  Location: [place];  Service: Vascular;  Laterality: Left;  With Nerve block  . AUGMENTATION MAMMAPLASTY    . BELPHAROPTOSIS REPAIR     eyelid lift  . BIOPSY  10/14/2018   Procedure: BIOPSY;  Surgeon: Rush Landmark Telford Nab., MD;   Location: [place];  Service: Gastroenterology;;  . BIOPSY  08/04/2019   Procedure: BIOPSY;  Surgeon: Irving Copas., MD;  Location: [place];  Service: Gastroenterology;;  . CARDIOVERSION N/A 09/19/2019   Procedure: CARDIOVERSION;  Surgeon: Buford Dresser, MD;  [facility];  Service: Cardiovascular;  Laterality: N/A;  . COLONOSCOPY    . COMBINED AUGMENTATION MAMMAPLASTY AND ABDOMINOPLASTY  2009   W/  BILATERAL  THIGH LIFT  . CYSTO/  RIGHT URETERAL STENT PLACEMENT  12-30-2010  . CYSTOSCOPY WITH RETROGRADE PYELOGRAM, URETEROSCOPY AND STENT PLACEMENT Right 10/07/2013   Procedure: CYSTOSCOPY WITH RETROGRADE PYELOGRAM, right URETEROSCOPY AND STENT PLACEMENT, stone extraction;  Surgeon: Arvil Persons, MD;  Location: [facility];  Service: Urology;  Laterality: Right;  . CYSTOSCOPY WITH STENT PLACEMENT Right 06/04/2013   Procedure: CYSTOSCOPY WITH STENT PLACEMENT;  Surgeon: Franchot [place]o, MD;  Location: [facility];  Service: Urology;  Laterality: Right;  . DILATATION & CURETTAGE/HYSTEROSCOPY WITH MYOSURE N/A 04/27/2015   Procedure: DILATATION & CURETTAGE/HYSTEROSCOPY WITH MYOSURE;  Surgeon: Terrance Mass, MD;  Location: [place] [facility];  Service: Gynecology;  Laterality: N/A;  . ENDARTERECTOMY FEMORAL Left 06/08/2014   Procedure: Left Leg Common Femoral and External Iliac  Endartarectomy with patch Angioplasty;  Surgeon: Rosetta Posner, MD;  Location: [place];  Service: Vascular;  Laterality: Left;  . ESOPHAGOGASTRODUODENOSCOPY N/A  10/14/2018   Procedure: ESOPHAGOGASTRODUODENOSCOPY (EGD);  Surgeon: Irving Copas., MD;  Location: [place];  Service: Gastroenterology;  Laterality: N/A;  . ESOPHAGOGASTRODUODENOSCOPY (EGD) WITH PROPOFOL N/A 11/06/2018   Procedure: ESOPHAGOGASTRODUODENOSCOPY (EGD) WITH PROPOFOL;  Surgeon: Rush Landmark Telford Nab., MD;  [facility];  Service: Gastroenterology;  Laterality: N/A;  RFA  . ESOPHAGOGASTRODUODENOSCOPY (EGD) WITH  PROPOFOL N/A 02/12/2019   Procedure: ESOPHAGOGASTRODUODENOSCOPY (EGD) WITH PROPOFOL;  Surgeon: Rush Landmark Telford Nab., MD;  [facility];  Service: Gastroenterology;  Laterality: N/A;  . ESOPHAGOGASTRODUODENOSCOPY (EGD) WITH PROPOFOL N/A 08/04/2019   Procedure: ESOPHAGOGASTRODUODENOSCOPY (EGD) WITH PROPOFOL;  Surgeon: Rush Landmark Telford Nab., MD;  Location: [place];  Service: Gastroenterology;  Laterality: N/A;  WITH RFA  . EXTRACORPOREAL SHOCK WAVE LITHOTRIPSY Right 08-04-2013//   06-23-2013//   01-16-2011  . EYE SURGERY Bilateral    cataract  . FEMORAL-POPLITEAL BYPASS GRAFT Left 06/08/2014   Procedure: Left Leg Femoral -Popliteal Bypass Graft;  Surgeon: Rosetta Posner, MD;  Location: [place];  Service: Vascular;  Laterality: Left;  . FEMORAL-POPLITEAL BYPASS GRAFT Left 06/09/2014   Procedure: Left Femoral and Popliteal Exposure; Left Femoral to Anterior Tibial Bypass Graft using Propaten 37m by 80cm Goretex Graft; Left Tibial Endarterectomy; Left Femoraland Popliteal Thrombectomy ;  Surgeon: VSerafina Mitchell MD;  [place]  Service: Vascular;  Laterality: Left;  . GI RADIOFREQUENCY ABLATION N/A 11/06/2018   Procedure: GI RADIOFREQUENCY ABLATION;  Surgeon: MIrving Copas, MD;  [facility];  Service: Gastroenterology;  Laterality: N/A;  . GI RADIOFREQUENCY ABLATION N/A 02/12/2019   Procedure: GI RADIOFREQUENCY ABLATION;  Surgeon: MRush LandmarkGTelford Nab, MD;  [facility];  Service: Gastroenterology;  Laterality: N/A;  . GI RADIOFREQUENCY ABLATION N/A 08/04/2019   Procedure: GI RADIOFREQUENCY ABLATION;  Surgeon: MRush LandmarkGTelford Nab, MD;  [place]  Service: Gastroenterology;  Laterality: N/A;  . HOLMIUM LASER APPLICATION Right 63/07/7423  Procedure: HOLMIUM LASER APPLICATION;  Surgeon: MArvil Persons MD;  [facility]  Service: Urology;  Laterality: Right;  . KIDNEY STONE SURGERY  April 2015   1-2 stones  .  LAPAROSCOPIC GASTRIC BANDING  05-29-2005  . LITHOTRIPSY  2-3 times  . LOWER EXTREMITY ANGIOGRAM N/A 06/04/2014   Procedure: LOWER EXTREMITY ANGIOGRAM;  Surgeon: VSerafina Mitchell MD;  [facility];  Service: Cardiovascular;  Laterality: N/A;  . [place]  04-21-2002  . REVISION AND RE-SITING LAP-BAND PORT  04-08-2010   W/  UPPER EGD  . RIGHT KNEE PATELLECTOMY W/ REPAIR OF EXTENSOR MECHANISM  04-14-2002  . Toenail removed Left Jan. 21, 2016   2nd toenail-  Dr. PBarkley Bruns   Current Medications: Current Meds  Medication Sig  . AMBULATORY NON FORMULARY MEDICATION 946mViscous lidocaine, 9057m0mg/5ml Diyclomine, 270m53mlox Swish and Swallow 30mL13mmouth four times daily.  . Blood Glucose Monitoring Suppl (ONETOUCH VERIO IQ SYSTEM) w/Device KIT USE TO CHECK SUGAR DAILY  . diltiazem (CARDIZEM CD) 180 MG 24 hr capsule Take 1 capsule (180 mg total) by mouth daily.  . dorzolamide-timolol (COSOPT) 22.3-6.8 MG/ML ophthalmic solution Place 1 drop into the right eye 2 (two) times daily.   . Empagliflozin-metFORMIN HCl ER (SYNJARDY XR) 12.08-998 MG TB24 Take 1 tablet by mouth daily.  . ezeMarland Kitchenimibe (ZETIA) 10 MG tablet Take 1 tablet (10 mg total) by mouth at bedtime.  . gluMarland Kitchenose blood test strip Use to check blood sugar twice daily. DX: E11.8  . Insulin Glargine (BASAGLAR KWIKPEN) 100 UNIT/ML Inject  0.3 mLs (30 Units total) into the skin daily.  . insulin lispro (HUMALOG) 100 UNIT/ML injection Inject 14 Units into the skin 3 (three) times daily before meals.  . Insulin Pen Needle (PEN NEEDLES) 32G X 4 MM MISC Inject 1 pen as directed 4 (four) times daily. Use to inject levemir or novolog as directed.  . latanoprost (XALATAN) 0.005 % ophthalmic solution Place 1 drop into both eyes at bedtime.   [name] losartan-hydrochlorothiazide (HYZAAR) 100-12.5 MG tablet Take 1 tablet by mouth daily.  . magnesium oxide (MAG-OX) 400 MG tablet Take 1 tablet (400 mg total) by mouth at bedtime.  .  nebivolol (BYSTOLIC) 5 MG tablet Take 1 tablet (5 mg total) by mouth daily.  [name] omeprazole (PRILOSEC) 40 MG capsule Take 1 capsule (40 mg total) by mouth 2 (two) times daily before a meal.  . potassium chloride SA (KLOR-CON) 20 MEQ tablet Take 1 tablet (20 mEq total) by mouth daily.  . pravastatin (PRAVACHOL) 40 MG tablet Take 1 tablet (40 mg total) by mouth daily.  . rivaroxaban (XARELTO) 20 MG TABS tablet Take 1 tablet (20 mg total) by mouth daily.  [name] thiamine 100 MG tablet Take 1 tablet (100 mg total) by mouth every other day.  [name] VICTOZA 18 MG/3ML SOPN INJECT 1.8 MG INTO THE SKIN DAILY     Allergies:   Amlodipine, Atorvastatin, Ampicillin, Codeine, Lisinopril, Penicillins, and Januvia [sitagliptin]   Social History   Socioeconomic History  . Marital status: Single    Spouse name: Not on file  . Number of children: Not on file  . Years of education: Not on file  . Highest education level: Not on file  Occupational History  . 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 Smoking status: Former Smoker    Packs/day: 1.00    Years: 28.00    Pack years: 28.00    Types: Cigarettes    Quit date: 09/05/2001    Years since quitting: 18.1  . Smokeless tobacco: Never Used  Vaping Use  . Vaping Use: Never used  Substance and Sexual Activity  . Alcohol use: No    Alcohol/week: 0.0 standard drinks  . Drug use: No  . Sexual activity: Yes  Other Topics Concern  . Not on file  Social History Narrative   Regular exercise- yes   Social Determinants of Health   Financial Resource Strain: High Risk  . Difficulty of Paying Living Expenses: Hard  Food Insecurity:   . Worried About Charity fundraiser in the Last Year:   . Arboriculturist in the Last Year:   Transportation Needs:   . Film/video editor (Medical):   [name] Lack of Transportation (Non-Medical):   Physical Activity:   . Days of Exercise per Week:   . Minutes of Exercise per Session:   Stress:   .  Feeling of Stress :   Social Connections:   . Frequency of Communication with Friends and Family:   . Frequency of Social Gatherings with Friends and Family:   . Attends Religious Services:   . Active Member of Clubs or Organizations:   . Attends Archivist Meetings:   [name] Marital Status:      Family History: The patient's family history includes Arthritis in an other family member; Cancer in her brother and another family member; Colon cancer in her maternal aunt; Deep vein thrombosis in her father and sister; Diabetes in her brother, brother, brother,  father, and sister; Heart disease in her brother, father, and sister; Hyperlipidemia in her brother, father, mother, and sister; Hypertension in an other family member; Kidney disease in her mother; Other in her mother; Stroke in an other family member. There is no history of Esophageal cancer, Stomach cancer, Inflammatory bowel disease, Liver disease, or Pancreatic cancer.  ROS:   Please see the history of present illness.     All other systems reviewed and are negative.  EKGs/Labs/Other Studies Reviewed:    The following studies were reviewed today: No reviewed   Recent Labs: 06/04/2019: TSH 0.42 09/04/2019: Magnesium 1.8; Platelets 293 09/19/2019: BUN 16; Creatinine, Ser 0.90; Hemoglobin 15.3; Potassium 3.2; Sodium 141  Recent Lipid Panel    Component Value Date/Time   CHOL 134 06/04/2019 1122   TRIG 114.0 06/04/2019 1122   HDL 47.90 06/04/2019 1122   CHOLHDL 3 06/04/2019 1122   VLDL 22.8 06/04/2019 1122   LDLCALC 64 06/04/2019 1122   LDLDIRECT 160.2 03/01/2010 1001    Physical Exam:    VS:  BP 100/60   Pulse 65   Ht '5\' 5"'  (1.651 m)   Wt 171 lb (77.6 kg)   LMP  (LMP Unknown)   SpO2 97%   BMI 28.46 kg/m     Wt Readings from Last 3 Encounters:  10/09/19 171 lb (77.6 kg)  10/02/19 175 lb (79.4 kg)  09/19/19 177 lb (80.3 kg)     GEN:  Well nourished, well developed in no acute distress HEENT: Normal NECK: No  JVD; No carotid bruits LYMPHATICS: No lymphadenopathy CARDIAC: irreg, no murmurs, rubs, gallops RESPIRATORY:  Clear to auscultation without rales, wheezing or rhonchi  ABDOMEN: Soft, non-tender, non-distended MUSCULOSKELETAL:  No edema; No deformity  SKIN: Warm and dry NEUROLOGIC:  Alert and oriented x 3 PSYCHIATRIC:  Normal affect   ASSESSMENT:    1. Nonrheumatic mitral valve stenosis    PLAN:    In order of problems listed above:  Mitral stenosis -Previously described as moderate on echocardiogram.  Certainly we could consider transesophageal echocardiogram to further evaluate mitral valve.  Currently has rate controlled atrial fibrillation with Bystolic 5 mg once a day and diltiazem. -If the mitral stenosis were severe, occasionally we may need to rethink our anticoagulation of choice.  Currently on Xarelto 20 mg a day.  She understands this. -It may be very challenging for her to continue with sinus rhythm with her underlying moderate mitral stenosis however.  She would like to meet again next month for further discussion.  This is fine.   Medication Adjustments/Labs and Tests Ordered: Current medicines are reviewed at length with the patient today.  Concerns regarding medicines are outlined above.  No orders of the defined types were placed in this encounter.  No orders of the defined types were placed in this encounter.   Patient Instructions  Medication Instructions:  The current medical regimen is effective;  continue present plan and medications.  *If you need a refill on your cardiac medications before your next appointment, please call your pharmacy*  Follow-Up: At [facility], you and your health needs are our priority.  As part of our continuing mission to provide you with exceptional heart care, we have created designated Provider Care Teams.  These Care Teams include your primary Cardiologist (physician) and Advanced Practice Providers (APPs -  Physician  Assistants and Nurse Practitioners) who all work together to provide you with the care you need, when you need it.  We recommend signing up for  the patient portal called "MyChart".  Sign up information is provided on this After Visit Summary.  MyChart is used to connect with patients for Virtual Visits (Telemedicine).  Patients are able to view lab/test results, encounter notes, upcoming appointments, etc.  Non-urgent messages can be sent to your provider as well.   To learn more about what you can do with MyChart, go to [email or web address].    Your next appointment:   4 week(s)  The format for your next appointment:   In Person  Provider:   [name], MD   Thank you for choosing [facility]!!        Signed, [name], MD  10/09/2019 5:16 PM    [place]

## 2019-10-09 NOTE — Patient Instructions (Signed)
Medication Instructions:  The current medical regimen is effective;  continue present plan and medications.  *If you need a refill on your cardiac medications before your next appointment, please call your pharmacy*  Follow-Up: At [facility], you and your health needs are our priority.  As part of our continuing mission to provide you with exceptional heart care, we have created designated Provider Care Teams.  These Care Teams include your primary Cardiologist (physician) and Advanced Practice Providers (APPs -  Physician Assistants and Nurse Practitioners) who all work together to provide you with the care you need, when you need it.  We recommend signing up for the patient portal called "MyChart".  Sign up information is provided on this After Visit Summary.  MyChart is used to connect with patients for Virtual Visits (Telemedicine).  Patients are able to view lab/test results, encounter notes, upcoming appointments, etc.  Non-urgent messages can be sent to your provider as well.   To learn more about what you can do with MyChart, go to [email or web address].    Your next appointment:   4 week(s)  The format for your next appointment:   In Person  Provider:   [name], MD   Thank you for choosing [facility]!!

## 2019-10-15 ENCOUNTER — Ambulatory Visit: Payer: PPO | Admitting: Pharmacist

## 2019-10-15 ENCOUNTER — Other Ambulatory Visit: Payer: Self-pay

## 2019-10-15 DIAGNOSIS — I10 Essential (primary) hypertension: Secondary | ICD-10-CM

## 2019-10-15 DIAGNOSIS — K22711 Barrett's esophagus with high grade dysplasia: Secondary | ICD-10-CM

## 2019-10-15 DIAGNOSIS — IMO0002 Reserved for concepts with insufficient information to code with codable children: Secondary | ICD-10-CM

## 2019-10-15 DIAGNOSIS — I4891 Unspecified atrial fibrillation: Secondary | ICD-10-CM

## 2019-10-15 DIAGNOSIS — E785 Hyperlipidemia, unspecified: Secondary | ICD-10-CM

## 2019-10-15 NOTE — Chronic Care Management (AMB) (Signed)
Chronic Care Management Pharmacy  Name: Theresa Harrington  MRN: [id number] DOB: 01/13/1956  Chief Complaint/ HPI  Theresa Harrington,  64 y.o. , female presents for their Follow-Up CCM visit with the clinical pharmacist via telephone due to COVID-19 Pandemic.  PCP : [name], MD  Their chronic conditions include: HTN, T2DM, PAD, Afib, HLD, GERD/Barrett's esophagus, constipation  Office Visits: 09/03/19 Dr [name] OV: Afib RVR, BP 172/92 HR 992 -added Bystolic 5 mg. Also rx'd Vitamin B12 injection.  07/03/19 Dr [name] OV: Added SGLT2 for DM control, weight loss and BP control (goal <130/80).  06/18/19 Dr [name] OV: BP not at goal, stopped chlorthalidone and added Edarbi.  Consult Visit: 10/09/19 Dr Marlou Porch (cardiology): mitral stenosis - previously moderate on ECHO. Consider TEE for further evaluation. If MS is severe would need to reconsider Xarelto as OAC of choice. F/U 4 wks to continue discussion.  10/02/19 NP Kayleen Memos (Afib clinic): cardioversion 5/21 was successful but now back in Afib. CHADSVASC 4, continue Xarelto. Referred to Dr Marlou Porch for possible further w/u w/ TEE. May consider Tikosyn.  09/10/19 PA Bhagat (cardiology): scheduled cardioversion 09/19/19.  09/04/19 NP Dorene Ar (cardiology): BP improved after Bystolic - HR 426, BP 834/19. Plan repeat EKG, DCCV if needed.  08/28/19 Dr Gean Quint (podiatry [facility]): hx diabetic food ulcer, amputation of L toe. Rec'd daily foot inspections, no wounds noted.  08/04/19 admission for upper endoxcopy: rx'd lidocaine/antacid mouthwash, liquid Tylenol/hydrocodone, sucralfate suspension x 1 month  Medications: Outpatient Encounter Medications as of 10/15/2019  Medication Sig  . AMBULATORY NON FORMULARY MEDICATION 57m-Viscous lidocaine, 937m10mg/5ml Diyclomine, 27020malox Swish and Swallow 57m70m mouth four times daily.  . Blood Glucose Monitoring Suppl (ONETOUCH VERIO IQ SYSTEM) w/Device KIT USE TO CHECK SUGAR DAILY  . diltiazem (CARDIZEM CD) 180 MG 24 hr  capsule Take 1 capsule (180 mg total) by mouth daily.  . dorzolamide-timolol (COSOPT) 22.3-6.8 MG/ML ophthalmic solution Place 1 drop into the right eye 2 (two) times daily.   . Empagliflozin-metFORMIN HCl ER (SYNJARDY XR) 12.08-998 MG TB24 Take 1 tablet by mouth daily.  . ezMarland Kitchentimibe (ZETIA) 10 MG tablet Take 1 tablet (10 mg total) by mouth at bedtime.  . glMarland Kitchencose blood test strip Use to check blood sugar twice daily. DX: E11.8  . Insulin Glargine (BASAGLAR KWIKPEN) 100 UNIT/ML Inject 0.3 mLs (30 Units total) into the skin daily.  . insulin lispro (HUMALOG) 100 UNIT/ML injection Inject 14 Units into the skin 3 (three) times daily before meals.  . Insulin Pen Needle (PEN NEEDLES) 32G X 4 MM MISC Inject 1 pen as directed 4 (four) times daily. Use to inject levemir or novolog as directed.  . latanoprost (XALATAN) 0.005 % ophthalmic solution Place 1 drop into both eyes at bedtime.   . loMarland Kitchenartan-hydrochlorothiazide (HYZAAR) 100-12.5 MG tablet Take 1 tablet by mouth daily.  . magnesium oxide (MAG-OX) 400 MG tablet Take 1 tablet (400 mg total) by mouth at bedtime.  . nebivolol (BYSTOLIC) 5 MG tablet Take 1 tablet (5 mg total) by mouth daily.  . omMarland Kitchenprazole (PRILOSEC) 40 MG capsule Take 1 capsule (40 mg total) by mouth 2 (two) times daily before a meal.  . potassium chloride SA (KLOR-CON) 20 MEQ tablet Take 1 tablet (20 mEq total) by mouth daily.  . pravastatin (PRAVACHOL) 40 MG tablet Take 1 tablet (40 mg total) by mouth daily.  . rivaroxaban (XARELTO) 20 MG TABS tablet Take 1 tablet (20 mg total) by mouth daily.  . thMarland Kitchenamine 100 MG tablet Take 1 tablet (  100 mg total) by mouth every other day.  [name] VICTOZA 18 MG/3ML SOPN INJECT 1.8 MG INTO THE SKIN DAILY   No facility-administered encounter medications on file as of 10/15/2019.     Current Diagnosis/Assessment:    Goals Addressed            This Visit's Progress   . Pharmacy Care Plan       CARE PLAN ENTRY  Current Barriers:  . Chronic Disease  Management support, education, and care coordination needs related to Hypertension, Hyperlipidemia, Diabetes, Atrial Fibrillation, and GERD   Hypertension/Atrial Fibrillation BP Readings from Last 3 Encounters:  10/09/19 100/60  10/02/19 120/60  09/19/19 126/64   Pulse Readings from Last 3 Encounters:  10/09/19 65  10/02/19 88  09/19/19 (!) 56 .  Pharmacist Clinical Goal(s): o Over the next 30 days, patient will work with PharmD and providers to achieve BP goal <130/80 and HR goal 80-110 . Current regimen:  o Diltiazem ER 120 mg daily o Bystolic 5 mg daily o Losartan-HCTZ 100-12.5 mg daily o Synjardy ER 12.08-998 mg once daily o Xarelto 20 mg daily . Interventions: o Discussed BP goal and benefits of medications o Counseled on diuretic effect of hydrochlorothiazide and Synjardy (specifically Jardiance component that works by filtering sugar into the urine) o Discussed that losartan-HCTZ can and should be taken with Synjardy . Patient self care activities - Over the next 30 days, patient will: o Check BP 3-5 times weekly, document, and provide at future appointments o Ensure daily salt intake < 2300 mg/day  Hyperlipidemia/Peripheral artery disease Lab Results  Component Value Date/Time   LDLCALC 64 06/04/2019 11:22 AM   LDLDIRECT 160.2 03/01/2010 10:01 AM .  Pharmacist Clinical Goal(s): o Over the next 30 days, patient will work with PharmD and providers to maintain LDL goal < 70 . Current regimen:  o Pravastatin 40 mg daily at bedtime o Ezetimibe 10 mg daily at bedtime . Interventions: o Discussed Zetia (ezetimibe) is now generic and should be covered by insurance with low tier copay . Patient self care activities - Over the next 30 days, patient will: o Take cholesterol lowering medications as prescribed at bedtime  Diabetes Hgb A1c MFr Bld  Date Value Ref Range Status  06/04/2019 7.3 (H) 4.6 - 6.5 % Final .  Pharmacist Clinical Goal(s): o Over the next 30 days,  patient will work with PharmD and providers to maintain A1c goal <8% . Current regimen:  o Synjardy XR 12.08-998 mg once daily o Basaglar 60 units at bedtime o Humalog 10 units 3 times daily with meals o Victoza 1.8 mg injected once daily . Interventions: o Discussed insulin resistance and difficulties with weight loss o Discussed that Victoza and Synjardy can help with weight loss o Discussed Ozempic has greater weight loss than Victoza in clinical trials o Recommended switch Victoza to Ozempic for improved weight loss potential . Patient self care activities - Over the next 30 days, patient will: o Check blood sugar twice daily, in the morning before eating or drinking, and at bedtime, document, and provide at future appointments o Contact provider with any episodes of hypoglycemia  Vitamins/Supplements . Pharmacist Clinical Goal(s) o Over the next 30 days, patient will work with PharmD and providers to optimize vitamin regimen . Current regimen:  o Thiamine 100 mg every day at bedtime o Potassium chloride 20 mEq twice a week o Magnesium oxide 400 mg at bedtime . Interventions: o Discussed thiamine (Vitamin B1) level was low at  last check and patient will benefit from daily supplementation o Discussed potassium level was on low end of normal at last check and may continue taking potassium as previously o Discussed magnesium level has not been checked since 2018 but it was low at that time, patient may benefit from continued supplementation . Patient self care activities - Over the next 30 days, patient will: o Increase thiamine (Vitamin B1) to daily dosing o Continue potassium and magnesium supplements as previously tolerated  Medication management . Pharmacist Clinical Goal(s): o Over the next 30 days, patient will work with PharmD and providers to achieve optimal medication adherence and reduce medication costs . Current pharmacy: [facility] . Interventions o Comprehensive  medication review performed. o Pursue patient assistance for [employer], Synjardy, Xarelto, Ozempic o Utilize [facility] pharmacy for medication synchronization, packaging and delivery . Patient self care activities - Over the next 30 days, patient will: o Focus on medication adherence by pill pack o Take medications as prescribed o Report any questions or concerns to PharmD and/or provider(s)  Please see past updates related to this goal by clicking on the "Past Updates" button in the selected goal         Hypertension/AFib   Patient is currently rate controlled. Pulse Readings from Last 3 Encounters:  10/09/19 65  10/02/19 88  09/19/19 (!) 56   BP goal is < 130/80 Office blood pressures are  BP Readings from Last 3 Encounters:  10/09/19 100/60  10/02/19 120/60  09/19/19 126/64   Patient has failed these meds in the past: amlodipine (swelling), lisinopril (cough) Patient is currently controlled on the following medications:   Diltiazem ER 824 mg daily  Bystolic 5 mg daily (samples x 84 days from 09/03/19)  Losartan-HCTZ 100-12.5 mg daily  Synjardy XR 12.08-998 mg once daily  Xarelto 20 mg daily  Patient checks BP at home 3-5x per week  Patient home BP readings are ranging: 130-140s/80s  We discussed: BP is controlled with current regimen; she is following with Afib clinic after cardioversion last month; further evaluation of mitral valve via TEE scheduled in August - if mitral stenosis is severe, Xarelto may need to be changed.  Plan  Continue current medications and control with diet and exercise    Hyperlipidemia/PAD   Lipid Panel     Component Value Date/Time   CHOL 134 06/04/2019 1122   TRIG 114.0 06/04/2019 1122   HDL 47.90 06/04/2019 1122   CHOLHDL 3 06/04/2019 1122   VLDL 22.8 06/04/2019 1122   LDLCALC 64 06/04/2019 1122   Hepatic Function Latest Ref Rng & Units 10/08/2018 06/05/2016 02/29/2016  Total Protein 6.0 - 8.3 g/dL 6.9 6.9 6.9  Albumin  3.5 - 5.2 g/dL 4.0 4.0 4.2  AST 0 - 37 U/L '18 10 15  ' ALT 0 - 35 U/L '20 10 14  ' Alk Phosphatase 39 - 117 U/L 53 58 53  Total Bilirubin 0.2 - 1.2 mg/dL 0.4 0.3 0.4  Bilirubin, Direct 0.0 - 0.3 mg/dL - - -   The 10-year ASCVD risk score Mikey Bussing DC Jr., et al., 2013) is: 6.9%   Values used to calculate the score:     Age: 59 years     Sex: Female     Is Non-Hispanic African American: Yes     Diabetic: Yes     Tobacco smoker: No     Systolic Blood Pressure: 235 mmHg     Is BP treated: Yes     HDL Cholesterol: 47.9 mg/dL  Total Cholesterol: 134 mg/dL  ASCVD: hx of PAD  Patient has failed these meds in past: atorvastatin (myalgia) Patient is currently controlled on the following medications:   Pravastatin 40 mg daily HS  Ezetimibe 10 mg daily HS  We discussed:  diet and exercise extensively, cholesterol goals. Pt is now adherent with medication as prescribed after restarting ezetimibe last month.  Plan  Continue current medications and control with diet and exercise  Diabetes   Recent Relevant Labs: Lab Results  Component Value Date/Time   HGBA1C 6.3 (A) 09/03/2019 11:14 AM   HGBA1C 7.3 (H) 06/04/2019 11:22 AM   HGBA1C 8.4 (H) 01/08/2019 11:34 AM   GFR 91.83 06/04/2019 11:22 AM   GFR 93.34 01/08/2019 11:34 AM   MICROALBUR 42.3 (H) 02/26/2019 10:45 AM   MICROALBUR 6.3 (H) 03/05/2018 10:29 AM    Checking BG: 2x per Day  Recent FBG Readings: 100-120 Recent HS BG readings: 130-140 Hypoglycemia: no lows  Patient has failed these meds in past: Januvia (n/v) Patient is currently controlled on the following medications:   Synjardy XR 12.08-998 mg once daily (samples x 140 days from 07/03/19)  Basaglar 60 units HS  Humalog 10 units TID with meals  Victoza 1.8 mg [place] daily  Last diabetic Eye exam:  Lab Results  Component Value Date/Time   HMDIABEYEEXA No Retinopathy 12/04/2018 12:00 AM    Last diabetic Foot exam:  Lab Results  Component Value Date/Time    HMDIABFOOTEX done 10/31/2017 12:00 AM     We discussed: Last visit started PAP for Synjardy, Basaglar, Humalog and Ozempic. Per patient Synjardy PAP has been approved and she has received medication. She has not heard from [employer] for injectable meds. I contacted [employer], they needed patient consent re-faxed due to legibility issues. Once received Basaglar and Humalog will be shipped within 2-3 business days.  [employer] also required prescription page re-faxed earlier this month, I contacted them today and they needed to verify the office address; once verified PAP was approved, Ozempic will be mailed to patient within 10-14 days.  Plan  Continue current medications Synjardy PAP approved Ozempic PAP approved Basaglar / Humalog PAP approved pending receipt of re-faxed form  GERD/Barrett's   Patient has failed these meds in past: n/a Patient is currently controlled on the following medications:   Omeprazole 40 mg once daily HS  We discussed:  Pt denies heartburn/reflux symptoms, taking PPI mainly for Barrett's. Counseled PPI works best for heartburn when taken 30-60 min prior to a meal.  Plan  Continue current medications   Health Maintenance    Ref. Range 06/04/2019 11:22  Vitamin B1 (Thiamine) Latest Ref Range: 8 - 30 nmol/L 7 (L)   Patient is currently controlled on the following medications:   Thiamine 100 mg once daily  Potassium chloride 20 mEq twice a week  Magnesium oxide 400 mg HS  We discussed:  B1 level was low in Feb, counseled to take Thiamine supplement daily. She also does not like to take KCl because the tablet is so large, discussed alternatives including powder or liquid however pt would rather just continue tablets since she has a large supply.  Plan   Continue current medications  Medication Management   Pt uses [facility] for all medications 90-day adherence packaging Pt endorses 100% compliance  We discussed: Reviewed  patient's [facility] medication and Epic medication profile assuring there are no discrepancies or gaps in therapy. Confirmed all fill dates appropriate and verified with patient  that there is a sufficient quantity of all prescribed medications at home. Informed patient to call me any time if needing medications before scheduled deliveries.   PAP: Ozempic Primary school teacher) - approved [employer] Professor) - conditionally approved Humalog [employer]) - conditionally approved Xarelto ([place]) - $80 approved, awaiting free approval Synjardy ([employer]) - approved, pt has received meds   Plan  Utilize [facility] pharmacy for medication synchronization, packaging and delivery    Follow up: 1 month phone visit  Charlene Brooke, PharmD Clinical Pharmacist [facility] [phone]

## 2019-10-15 NOTE — Patient Instructions (Addendum)
Visit Information  Phone number for Pharmacist: [phone]  Patient assistance: Ozempic [facility]) - approved; will be mailed within 10-14 days Basaglar [employer]) - conditionally approved pending receipt of fax Humalog [employer]) - conditionally approved pending receipt of fax Xarelto ([employer]) - $80 approved, awaiting free approval Synjardy ([employer]) - approved, meds received  Goals Addressed            This Visit's Progress   . Pharmacy Care Plan       CARE PLAN ENTRY  Current Barriers:  . Chronic Disease Management support, education, and care coordination needs related to Hypertension, Hyperlipidemia, Diabetes, Atrial Fibrillation, and GERD   Hypertension/Atrial Fibrillation BP Readings from Last 3 Encounters:  10/09/19 100/60  10/02/19 120/60  09/19/19 126/64   Pulse Readings from Last 3 Encounters:  10/09/19 65  10/02/19 88  09/19/19 (!) 56 .  Pharmacist Clinical Goal(s): o Over the next 30 days, patient will work with PharmD and providers to achieve BP goal <130/80 and HR goal 80-110 . Current regimen:  o Diltiazem ER 120 mg daily o Bystolic 5 mg daily o Losartan-HCTZ 100-12.5 mg daily o Synjardy ER 12.08-998 mg once daily o Xarelto 20 mg daily . Interventions: o Discussed BP goal and benefits of medications o Counseled on diuretic effect of hydrochlorothiazide and Synjardy (specifically Jardiance component that works by filtering sugar into the urine) o Discussed that losartan-HCTZ can and should be taken with Synjardy . Patient self care activities - Over the next 30 days, patient will: o Check BP 3-5 times weekly, document, and provide at future appointments o Ensure daily salt intake < 2300 mg/day  Hyperlipidemia/Peripheral artery disease Lab Results  Component Value Date/Time   LDLCALC 64 06/04/2019 11:22 AM   LDLDIRECT 160.2 03/01/2010 10:01 AM .  Pharmacist Clinical Goal(s): o Over the next 30 days, patient will work with PharmD and  providers to maintain LDL goal < 70 . Current regimen:  o Pravastatin 40 mg daily at bedtime o Ezetimibe 10 mg daily at bedtime . Interventions: o Discussed Zetia (ezetimibe) is now generic and should be covered by insurance with low tier copay . Patient self care activities - Over the next 30 days, patient will: o Take cholesterol lowering medications as prescribed at bedtime  Diabetes Hgb A1c MFr Bld  Date Value Ref Range Status  06/04/2019 7.3 (H) 4.6 - 6.5 % Final .  Pharmacist Clinical Goal(s): o Over the next 30 days, patient will work with PharmD and providers to maintain A1c goal <8% . Current regimen:  o Synjardy XR 12.08-998 mg once daily o Basaglar 60 units at bedtime o Humalog 10 units 3 times daily with meals o Victoza 1.8 mg injected once daily . Interventions: o Discussed insulin resistance and difficulties with weight loss o Discussed that Victoza and Synjardy can help with weight loss o Discussed Ozempic has greater weight loss than Victoza in clinical trials o Recommended switch Victoza to Ozempic for improved weight loss potential . Patient self care activities - Over the next 30 days, patient will: o Check blood sugar twice daily, in the morning before eating or drinking, and at bedtime, document, and provide at future appointments o Contact provider with any episodes of hypoglycemia  Vitamins/Supplements . Pharmacist Clinical Goal(s) o Over the next 30 days, patient will work with PharmD and providers to optimize vitamin regimen . Current regimen:  o Thiamine 100 mg every day at bedtime o Potassium chloride 20 mEq twice a week o Magnesium  oxide 400 mg at bedtime . Interventions: o Discussed thiamine (Vitamin B1) level was low at last check and patient will benefit from daily supplementation o Discussed potassium level was on low end of normal at last check and may continue taking potassium as previously o Discussed magnesium level has not been checked since  2018 but it was low at that time, patient may benefit from continued supplementation . Patient self care activities - Over the next 30 days, patient will: o Increase thiamine (Vitamin B1) to daily dosing o Continue potassium and magnesium supplements as previously tolerated  Medication management . Pharmacist Clinical Goal(s): o Over the next 30 days, patient will work with PharmD and providers to achieve optimal medication adherence and reduce medication costs . Current pharmacy: [facility] . Interventions o Comprehensive medication review performed. o Pursue patient assistance for [employer], Synjardy, Xarelto, Ozempic o Utilize [facility] pharmacy for medication synchronization, packaging and delivery . Patient self care activities - Over the next 30 days, patient will: o Focus on medication adherence by pill pack o Take medications as prescribed o Report any questions or concerns to PharmD and/or provider(s)  Please see past updates related to this goal by clicking on the "Past Updates" button in the selected goal        The patient verbalized understanding of instructions provided today and agreed to receive a mailed copy of patient instruction and/or educational materials.  Telephone follow up appointment with pharmacy team member scheduled for: 1 month  Charlene Brooke, PharmD Clinical Pharmacist [facility] [phone]  Heart-Healthy Eating Plan Many factors influence your heart (coronary) health, including eating and exercise habits. Coronary risk increases with abnormal blood fat (lipid) levels. Heart-healthy meal planning includes limiting unhealthy fats, increasing healthy fats, and making other diet and lifestyle changes. What are tips for following this plan? Cooking Cook foods using methods other than frying. Baking, boiling, grilling, and broiling are all good options. Other ways to reduce fat include:  Removing the skin from  poultry.  Removing all visible fats from meats.  Steaming vegetables in water or broth. Meal planning   At meals, imagine dividing your plate into fourths: ? Fill one-half of your plate with vegetables and green salads. ? Fill one-fourth of your plate with whole grains. ? Fill one-fourth of your plate with lean protein foods.  Eat 4-5 servings of vegetables per day. One serving equals 1 cup raw or cooked vegetable, or 2 cups raw leafy greens.  Eat 4-5 servings of fruit per day. One serving equals 1 medium whole fruit,  cup dried fruit,  cup fresh, frozen, or canned fruit, or  cup 100% fruit juice.  Eat more foods that contain soluble fiber. Examples include apples, broccoli, carrots, beans, peas, and barley. Aim to get 25-30 g of fiber per day.  Increase your consumption of legumes, nuts, and seeds to 4-5 servings per week. One serving of dried beans or legumes equals  cup cooked, 1 serving of nuts is  cup, and 1 serving of seeds equals 1 tablespoon. Fats  Choose healthy fats more often. Choose monounsaturated and polyunsaturated fats, such as olive and canola oils, flaxseeds, walnuts, almonds, and seeds.  Eat more omega-3 fats. Choose salmon, mackerel, sardines, tuna, flaxseed oil, and ground flaxseeds. Aim to eat fish at least 2 times each week.  Check food labels carefully to identify foods with trans fats or high amounts of saturated fat.  Limit saturated fats. These are found in animal products,  such as meats, butter, and cream. Plant sources of saturated fats include palm oil, palm kernel oil, and coconut oil.  Avoid foods with partially hydrogenated oils in them. These contain trans fats. Examples are stick margarine, some tub margarines, cookies, crackers, and other baked goods.  Avoid fried foods. General information  Eat more home-cooked food and less restaurant, buffet, and fast food.  Limit or avoid alcohol.  Limit foods that are high in starch and  sugar.  Lose weight if you are overweight. Losing just 5-10% of your body weight can help your overall health and prevent diseases such as diabetes and heart disease.  Monitor your salt (sodium) intake, especially if you have high blood pressure. Talk with your health care provider about your sodium intake.  Try to incorporate more vegetarian meals weekly. What foods can I eat? Fruits All fresh, canned (in natural juice), or frozen fruits. Vegetables Fresh or frozen vegetables (raw, steamed, roasted, or grilled). Green salads. Grains Most grains. Choose whole wheat and whole grains most of the time. Rice and pasta, including brown rice and pastas made with whole wheat. Meats and other proteins Lean, well-trimmed beef, veal, pork, and lamb. Chicken and [place] without skin. All fish and shellfish. Wild duck, rabbit, pheasant, and venison. Egg whites or low-cholesterol egg substitutes. Dried beans, peas, lentils, and tofu. Seeds and most nuts. Dairy Low-fat or nonfat cheeses, including ricotta and mozzarella. Skim or 1% milk (liquid, powdered, or evaporated). Buttermilk made with low-fat milk. Nonfat or low-fat yogurt. Fats and oils Non-hydrogenated (trans-free) margarines. Vegetable oils, including soybean, sesame, sunflower, olive, peanut, safflower, corn, canola, and cottonseed. Salad dressings or mayonnaise made with a vegetable oil. Beverages Water (mineral or sparkling). Coffee and tea. Diet carbonated beverages. Sweets and desserts Sherbet, gelatin, and fruit ice. Small amounts of dark chocolate. Limit all sweets and desserts. Seasonings and condiments All seasonings and condiments. The items listed above may not be a complete list of foods and beverages you can eat. Contact a dietitian for more options. What foods are not recommended? Fruits Canned fruit in heavy syrup. Fruit in cream or butter sauce. Fried fruit. Limit coconut. Vegetables Vegetables cooked in cheese, cream, or  butter sauce. Fried vegetables. Grains Breads made with saturated or trans fats, oils, or whole milk. Croissants. Sweet rolls. Donuts. High-fat crackers, such as cheese crackers. Meats and other proteins Fatty meats, such as hot dogs, ribs, sausage, bacon, rib-eye roast or steak. High-fat deli meats, such as salami and bologna. Caviar. Domestic duck and goose. Organ meats, such as liver. Dairy Cream, sour cream, cream cheese, and creamed cottage cheese. Whole milk cheeses. Whole or 2% milk (liquid, evaporated, or condensed). Whole buttermilk. Cream sauce or high-fat cheese sauce. Whole-milk yogurt. Fats and oils Meat fat, or shortening. Cocoa butter, hydrogenated oils, palm oil, coconut oil, palm kernel oil. Solid fats and shortenings, including bacon fat, salt pork, lard, and butter. Nondairy cream substitutes. Salad dressings with cheese or sour cream. Beverages Regular sodas and any drinks with added sugar. Sweets and desserts Frosting. Pudding. Cookies. Cakes. Pies. Milk chocolate or white chocolate. Buttered syrups. Full-fat ice cream or ice cream drinks. The items listed above may not be a complete list of foods and beverages to avoid. Contact a dietitian for more information. Summary  Heart-healthy meal planning includes limiting unhealthy fats, increasing healthy fats, and making other diet and lifestyle changes.  Lose weight if you are overweight. Losing just 5-10% of your body weight can help your overall health and prevent  diseases such as diabetes and heart disease.  Focus on eating a balance of foods, including fruits and vegetables, low-fat or nonfat dairy, lean protein, nuts and legumes, whole grains, and heart-healthy oils and fats. This information is not intended to replace advice given to you by your health care provider. Make sure you discuss any questions you have with your health care provider. Document Revised: 05/25/2017 Document Reviewed: 05/25/2017 Elsevier Patient  Education  2020 [employer].

## 2019-10-29 ENCOUNTER — Ambulatory Visit: Payer: Self-pay | Admitting: Pharmacist

## 2019-10-29 DIAGNOSIS — IMO0002 Reserved for concepts with insufficient information to code with codable children: Secondary | ICD-10-CM

## 2019-10-29 DIAGNOSIS — I10 Essential (primary) hypertension: Secondary | ICD-10-CM

## 2019-10-29 NOTE — Progress Notes (Signed)
  Chronic Care Management   Outreach Note  10/29/2019 Name: Theresa Harrington MRN: [id number] DOB: 1955/08/22  Referred by: [name], MD Reason for referral : No chief complaint on file.   Reviewed chart for medication changes ahead of medication coordination call. . No Office visits, Consults, or Hospital visits since last care coordination call/Pharmacist visit.  . No medication changes indicated   BP Readings from Last 3 Encounters:  10/09/19 100/60  10/02/19 120/60  09/19/19 126/64    Lab Results  Component Value Date   HGBA1C 6.3 (A) 09/03/2019     Medication management via [facility] pharmacy: Patient obtains medications through Adherence Packaging  90 Days  -patient requests lock box for medication deliveries. Coordinating with pharmacy to obtain lock box for patient.  Patient is due for next adherence delivery on: 10/30/19. Called patient and reviewed medications and coordinated delivery.  This delivery to include: Omeprazole 40mg  twice daily- Breakfast, Dinner  Mag Oxide 400mg  daily- Bedtime  Ezetimibe 10mg  daily-Bedtime  Pravastatin 40mg  daily-Bedtime  Losartan-HCTZ 100-12.5-daily-Bedtime  Vitamin B1 100mg  daily-Bedtime  Diltiazem 24h Er (cd) 180mg  daily- Breakfast  Pot Cl Micro 21meq ER daily -Breakfast  Confirmed delivery date of 10/30/19, advised patient that pharmacy will contact them the morning of delivery.   Charlene Brooke, PharmD

## 2019-11-04 ENCOUNTER — Telehealth: Payer: Self-pay | Admitting: Internal Medicine

## 2019-11-04 DIAGNOSIS — I10 Essential (primary) hypertension: Secondary | ICD-10-CM

## 2019-11-04 DIAGNOSIS — I4891 Unspecified atrial fibrillation: Secondary | ICD-10-CM

## 2019-11-04 NOTE — Telephone Encounter (Signed)
New message:   Pt is calling and would like a return call from the pharmacist. Please advise.

## 2019-11-04 NOTE — Telephone Encounter (Signed)
I called patient back, she is asking about Ozempic PAP. On 10/15/19 [employer] informed me she was approved and they would be mailing drug within 10-14 days. Ozempic must be mailed to provider's office, not patient's home, and we have not received it yet. Apparently it has been taking >3 weeks from date of approval for Ozempic to arrive. Will check to see if samples are available in the meantime.  Pt also asking about Bystolic - she is now out of samples and will need rx sent to [facility]. Pt has been in donut hole previously so this will likely be too expensive, we will start PAP for Bystolic as well and check if more samples are available in the meantime.   Also discussed diltiazem, pt has about a week remaining and then will need refill. Informed [facility] pharmacy of due date 11/10/19.

## 2019-11-05 MED ORDER — NEBIVOLOL HCL 5 MG PO TABS: 5.0000 mg | ORAL_TABLET | Freq: Every day | ORAL | 0 refills | Status: DC

## 2019-11-05 NOTE — Telephone Encounter (Signed)
Left detailed message for pt informing that the samples of Bystolic 5 mg are up front for her to pick up at her earliest convenience.

## 2019-11-06 ENCOUNTER — Telehealth: Payer: Self-pay

## 2019-11-06 ENCOUNTER — Ambulatory Visit: Payer: PPO | Admitting: Cardiology

## 2019-11-06 DIAGNOSIS — I10 Essential (primary) hypertension: Secondary | ICD-10-CM

## 2019-11-06 DIAGNOSIS — I4891 Unspecified atrial fibrillation: Secondary | ICD-10-CM

## 2019-11-06 MED ORDER — NEBIVOLOL HCL 5 MG PO TABS: 5.0000 mg | ORAL_TABLET | Freq: Every day | ORAL | 1 refills | Status: DC

## 2019-11-06 NOTE — Telephone Encounter (Signed)
erx sent to [facility] as requested.

## 2019-11-06 NOTE — Telephone Encounter (Signed)
-----   Message from Charlton Haws, [facility] sent at 11/05/2019 12:20 PM EDT ----- Regarding: Bystolic refill Can you send her Bystolic refill to [facility] please?

## 2019-11-13 ENCOUNTER — Ambulatory Visit: Payer: PPO | Admitting: Pharmacist

## 2019-11-13 ENCOUNTER — Other Ambulatory Visit: Payer: Self-pay

## 2019-11-13 ENCOUNTER — Telehealth: Payer: Self-pay

## 2019-11-13 DIAGNOSIS — I10 Essential (primary) hypertension: Secondary | ICD-10-CM

## 2019-11-13 DIAGNOSIS — IMO0002 Reserved for concepts with insufficient information to code with codable children: Secondary | ICD-10-CM

## 2019-11-13 DIAGNOSIS — E785 Hyperlipidemia, unspecified: Secondary | ICD-10-CM

## 2019-11-13 DIAGNOSIS — I4891 Unspecified atrial fibrillation: Secondary | ICD-10-CM

## 2019-11-13 NOTE — Telephone Encounter (Signed)
-----   Message from [place], [facility] sent at 11/13/2019  1:03 PM EDT ----- Regarding: test strips Can you refill test strips (One Touch Verio) to [facility] pharmacy?

## 2019-11-13 NOTE — Chronic Care Management (AMB) (Signed)
Chronic Care Management Pharmacy  Name: Theresa Harrington  MRN: [id number] DOB: [birth year]  Chief Complaint/ HPI  Theresa Harrington,  64 y.o. , female presents for their Follow-Up CCM visit with the clinical pharmacist via telephone due to COVID-19 Pandemic.  PCP : [name], MD  Their chronic conditions include: HTN, T2DM, PAD, Afib, HLD, GERD/Barrett's esophagus, constipation  Office Visits: 09/03/19 Dr [name] OV: Afib RVR, BP 172/92 HR 932 -added Bystolic 5 mg. Also rx'd Vitamin B12 injection.  07/03/19 Dr [name] OV: Added SGLT2 for DM control, weight loss and BP control (goal <130/80).  06/18/19 Dr [name] OV: BP not at goal, stopped chlorthalidone and added Edarbi.  Consult Visit: 10/09/19 Dr Marlou Porch (cardiology): mitral stenosis - previously moderate on ECHO. Consider TEE for further evaluation. If MS is severe would need to reconsider Xarelto as OAC of choice. F/U 4 wks to continue discussion.  10/02/19 NP Kayleen Memos (Afib clinic): cardioversion 5/21 was successful but now back in Afib. CHADSVASC 4, continue Xarelto. Referred to Dr Marlou Porch for possible further w/u w/ TEE. May consider Tikosyn.  09/10/19 PA Bhagat (cardiology): scheduled cardioversion 09/19/19.  09/04/19 NP Dorene Ar (cardiology): BP improved after Bystolic - HR 671, BP 245/80. Plan repeat EKG, DCCV if needed.  08/28/19 Dr Gean Quint (podiatry [facility]): hx diabetic food ulcer, amputation of L toe. Rec'd daily foot inspections, no wounds noted.  08/04/19 admission for upper endoxcopy: rx'd lidocaine/antacid mouthwash, liquid Tylenol/hydrocodone, sucralfate suspension x 1 month  Medications: Outpatient Encounter Medications as of 11/13/2019  Medication Sig  . AMBULATORY NON FORMULARY MEDICATION 72m-Viscous lidocaine, 968m10mg/5ml Diyclomine, 27029malox Swish and Swallow 15m80m mouth four times daily.  . Blood Glucose Monitoring Suppl (ONETOUCH VERIO IQ SYSTEM) w/Device KIT USE TO CHECK SUGAR DAILY  . diltiazem (CARDIZEM CD) 180 MG 24 hr  capsule Take 1 capsule (180 mg total) by mouth daily.  . dorzolamide-timolol (COSOPT) 22.3-6.8 MG/ML ophthalmic solution Place 1 drop into the right eye 2 (two) times daily.   . Empagliflozin-metFORMIN HCl ER (SYNJARDY XR) 12.08-998 MG TB24 Take 1 tablet by mouth daily.  . ezMarland Kitchentimibe (ZETIA) 10 MG tablet Take 1 tablet (10 mg total) by mouth at bedtime.  . glMarland Kitchencose blood test strip Use to check blood sugar twice daily. DX: E11.8  . Insulin Glargine (BASAGLAR KWIKPEN) 100 UNIT/ML Inject 0.3 mLs (30 Units total) into the skin daily.  . insulin lispro (HUMALOG) 100 UNIT/ML injection Inject 14 Units into the skin 3 (three) times daily before meals.  . Insulin Pen Needle (PEN NEEDLES) 32G X 4 MM MISC Inject 1 pen as directed 4 (four) times daily. Use to inject levemir or novolog as directed.  . latanoprost (XALATAN) 0.005 % ophthalmic solution Place 1 drop into both eyes at bedtime.   . loMarland Kitchenartan-hydrochlorothiazide (HYZAAR) 100-12.5 MG tablet Take 1 tablet by mouth daily.  . magnesium oxide (MAG-OX) 400 MG tablet Take 1 tablet (400 mg total) by mouth at bedtime.  . nebivolol (BYSTOLIC) 5 MG tablet Take 1 tablet (5 mg total) by mouth daily.  . omMarland Kitchenprazole (PRILOSEC) 40 MG capsule Take 1 capsule (40 mg total) by mouth 2 (two) times daily before a meal.  . potassium chloride SA (KLOR-CON) 20 MEQ tablet Take 1 tablet (20 mEq total) by mouth daily.  . pravastatin (PRAVACHOL) 40 MG tablet Take 1 tablet (40 mg total) by mouth daily.  . rivaroxaban (XARELTO) 20 MG TABS tablet Take 1 tablet (20 mg total) by mouth daily.  . thMarland Kitchenamine 100 MG tablet Take 1 tablet (  100 mg total) by mouth every other day.  [name] VICTOZA 18 MG/3ML SOPN INJECT 1.8 MG INTO THE SKIN DAILY   No facility-administered encounter medications on file as of 11/13/2019.     Current Diagnosis/Assessment:    Goals Addressed   None     Hypertension/AFib   Afib is currently rate controlled. Pulse Readings from Last 3 Encounters:  10/09/19 65   10/02/19 88  09/19/19 (!) 56   BP goal is < 130/80  Office blood pressures are  BP Readings from Last 3 Encounters:  10/09/19 100/60  10/02/19 120/60  09/19/19 126/64   Patient has failed these meds in the past: amlodipine (swelling), lisinopril (cough) Patient is currently controlled on the following medications:   Diltiazem ER 157 mg daily  Bystolic 5 mg daily (samples x 84 days from 09/03/19)  Losartan-HCTZ 100-12.5 mg daily  Synjardy XR 12.08-998 mg once daily  Xarelto 20 mg daily  Patient checks BP at home 3-5x per week  Patient home BP readings are ranging: 130-140s/80s  We discussed: BP is controlled with current regimen; she is following with Afib clinic after cardioversion 08/2019 - considering TEE, Tikosyn, evaluating for mitral stenosis and may need to switch to warfarin if valvular disease is severe. For now continuing Xarelto, awaiting patient assistance determination and patient is getting it for $80 per month through [place] [facility].  Plan  Continue current medications and control with diet and exercise   Hyperlipidemia/PAD   ASCVD: hx of PAD LDL goal < 70  Lipid Panel     Component Value Date/Time   CHOL 134 06/04/2019 1122   TRIG 114.0 06/04/2019 1122   HDL 47.90 06/04/2019 1122   CHOLHDL 3 06/04/2019 1122   VLDL 22.8 06/04/2019 1122   LDLCALC 64 06/04/2019 1122   LDLDIRECT 160.2 03/01/2010 1001   Hepatic Function Latest Ref Rng & Units 10/08/2018 06/05/2016 02/29/2016  Total Protein 6.0 - 8.3 g/dL 6.9 6.9 6.9  Albumin 3.5 - 5.2 g/dL 4.0 4.0 4.2  AST 0 - 37 U/L '18 10 15  ' ALT 0 - 35 U/L '20 10 14  ' Alk Phosphatase 39 - 117 U/L 53 58 53  Total Bilirubin 0.2 - 1.2 mg/dL 0.4 0.3 0.4  Bilirubin, Direct 0.0 - 0.3 mg/dL - - -   The 10-year ASCVD risk score Mikey Bussing DC Jr., et al., 2013) is: 6.9%   Values used to calculate the score:     Age: 64 years     Sex: Female     Is Non-Hispanic African American: Yes     Diabetic: Yes     Tobacco smoker: No      Systolic Blood Pressure: 262 mmHg     Is BP treated: Yes     HDL Cholesterol: 47.9 mg/dL     Total Cholesterol: 134 mg/dL  Patient has failed these meds in past: atorvastatin (myalgia) Patient is currently controlled on the following medications:   Pravastatin 40 mg daily HS  Ezetimibe 10 mg daily HS  We discussed:  diet and exercise extensively, cholesterol goals. Pt is now adherent with medication as prescribed after restarting ezetimibe in May 2021.  Plan  Continue current medications and control with diet and exercise  Diabetes   A1c goal < 7%  Recent Relevant Labs: Lab Results  Component Value Date/Time   HGBA1C 6.3 (A) 09/03/2019 11:14 AM   HGBA1C 7.3 (H) 06/04/2019 11:22 AM   HGBA1C 8.4 (H) 01/08/2019 11:34 AM   GFR 91.83 06/04/2019 11:22 AM  GFR 93.34 01/08/2019 11:34 AM   MICROALBUR 42.3 (H) 02/26/2019 10:45 AM   MICROALBUR 6.3 (H) 03/05/2018 10:29 AM    Checking BG: 2x per Day  Recent FBG Readings: 100-120 Recent HS BG readings: 130-140 Hypoglycemia: no lows  Last diabetic Eye exam:  Lab Results  Component Value Date/Time   HMDIABEYEEXA No Retinopathy 12/04/2018 12:00 AM    Last diabetic Foot exam:  Lab Results  Component Value Date/Time   HMDIABFOOTEX done 10/31/2017 12:00 AM    Patient has failed these meds in past: Januvia (n/v) Patient is currently controlled on the following medications:   Synjardy XR 12.08-998 mg once daily   Basaglar 60 units HS  Humalog 10 units TID with meals  Ozempic 0.5 mg weekly  We discussed: Pt is now approved for all PAP for all DM medications. She has not started Ozempic yet; counseled her to use 0.5 mg dose once weekly. Ozempic is more potent than Victoza so pt may need a lower dose of insulin.  Plan  Continue current medications F/U 1 week to adjust insulin if needed  GERD/Barrett's   Patient has failed these meds in past: n/a Patient is currently controlled on the following medications:   Omeprazole  40 mg once daily HS  We discussed:  Pt denies heartburn/reflux symptoms, taking PPI mainly for Barrett's. Counseled PPI works best for heartburn when taken 30-60 min prior to a meal.  Plan  Continue current medications  Medication Management   Pt uses [facility] pharmacy for all medications 90-day adherence packaging Pt endorses 100% compliance  We discussed: Reviewed patient's [facility] medication and Epic medication profile assuring there are no discrepancies or gaps in therapy. Confirmed all fill dates appropriate and verified with patient that there is a sufficient quantity of all prescribed medications at home. Informed patient to call me any time if needing medications before scheduled deliveries.   PAP: Ozempic Primary school teacher) - approved Associate Professor) - approved Humalog [employer]) - approved Xarelto ([employer]) -  awaiting approval Synjardy ([employer]) - approved   Plan  Utilize [facility] pharmacy for medication synchronization, packaging and delivery    Follow up: 1 week phone visit (blood sugars)  Charlene Brooke, PharmD Clinical Pharmacist [facility] [phone]

## 2019-11-13 NOTE — Patient Instructions (Addendum)
Visit Information  Phone number for Pharmacist: [phone]  Goals Addressed            This Visit's Progress   . Pharmacy Care Plan       CARE PLAN ENTRY  Current Barriers:  . Chronic Disease Management support, education, and care coordination needs related to Hypertension, Hyperlipidemia, Diabetes, Atrial Fibrillation   Hypertension/Atrial Fibrillation BP Readings from Last 3 Encounters:  10/09/19 100/60  10/02/19 120/60  09/19/19 126/64   Pulse Readings from Last 3 Encounters:  10/09/19 65  10/02/19 88  09/19/19 (!) 56 .  Pharmacist Clinical Goal(s): o Over the next 60 days, patient will work with PharmD and providers to achieve BP goal <130/80 and HR goal 80-110 . Current regimen:  o Diltiazem ER 120 mg daily o Bystolic 5 mg daily o Losartan-HCTZ 100-12.5 mg daily o Synjardy ER 12.08-998 mg once daily o Xarelto 20 mg daily . Interventions: o Discussed BP goal and benefits of medications o Pursue patient assistance for Xarelto - resubmitted paperwork at request of [employer] representative in July o Recommended to call Engineer, maintenance for $80 Xarelto in the meantime o Pursue patient assistance for [employer] . Patient self care activities - Over the next 60 days, patient will: o Check BP 3-5 times weekly, document, and provide at future appointments o Ensure daily salt intake < 2300 mg/day o Sign patient assistance application for Bystolic in office  Hyperlipidemia/Peripheral artery disease Lab Results  Component Value Date/Time   LDLCALC 64 06/04/2019 11:22 AM   LDLDIRECT 160.2 03/01/2010 10:01 AM .  Pharmacist Clinical Goal(s): o Over the next 60 days, patient will work with PharmD and providers to maintain LDL goal < 70 . Current regimen:  o Pravastatin 40 mg daily at bedtime o Ezetimibe 10 mg daily at bedtime . Interventions: o Counseled to restart ezetimibe since generic is covered by insurance . Patient self care activities - Over the next 30 days, patient  will: o Take cholesterol lowering medications as prescribed at bedtime  Diabetes Lab Results  Component Value Date/Time   HGBA1C 6.3 (A) 09/03/2019 11:14 AM   HGBA1C 7.3 (H) 06/04/2019 11:22 AM   HGBA1C 8.4 (H) 01/08/2019 11:34 AM .  Pharmacist Clinical Goal(s): o Over the next 30 days, patient will work with PharmD and providers to maintain A1c goal <8% . Current regimen:  o Synjardy XR 12.08-998 mg once daily o Basaglar 60 units at bedtime o Humalog 10 units 3 times daily with meals o Ozempic 0.5 mg weekly . Interventions: o Discussed insulin resistance and difficulties with weight loss o Switched Victoza to [employer] for greater weight loss benefit o Patient assistance for all DM medications approved o Refill test strips . Patient self care activities - Over the next 30 days, patient will: o Check blood sugar twice daily, in the morning before eating or drinking, and at bedtime, document, and provide at future appointments o Contact provider with any episodes of hypoglycemia  Medication management . Pharmacist Clinical Goal(s): o Over the next 30 days, patient will work with PharmD and providers to achieve optimal medication adherence and reduce medication costs . Current pharmacy: [facility] . Interventions o Comprehensive medication review performed. o Utilize [facility] pharmacy for medication synchronization, packaging and delivery . Patient self care activities - Over the next 30 days, patient will: o Focus on medication adherence by pill pack o Take medications as prescribed o Report any questions or concerns to PharmD and/or provider(s)  Please see past updates related to  this goal by clicking on the "Past Updates" button in the selected goal        Patient verbalizes understanding of instructions provided today.   Telephone follow up appointment with pharmacy team member scheduled for: 2 months  Charlene Brooke, PharmD Clinical Pharmacist [facility]  [facility] [phone]  Preventing Hypoglycemia Hypoglycemia occurs when the level of sugar (glucose) in the blood is too low. Hypoglycemia can happen in people who do or do not have diabetes (diabetes mellitus). It can develop quickly, and it can be a medical emergency. For most people with diabetes, a blood glucose level below 70 mg/dL (3.9 mmol/L) is considered hypoglycemia. Glucose is a type of sugar that provides the body's main source of energy. Certain hormones (insulin and glucagon) control the level of glucose in the blood. Insulin lowers blood glucose, and glucagon increases blood glucose. Hypoglycemia can result from having too much insulin in the bloodstream, or from not eating enough food that contains glucose. Your risk for hypoglycemia is higher:  If you take insulin or diabetes medicines to help lower your blood glucose or help your body make more insulin.  If you skip or delay a meal or snack.  If you are ill.  During and after exercise. You can prevent hypoglycemia by working with your health care provider to adjust your meal plan as needed and by taking other precautions. How can hypoglycemia affect me? Mild symptoms Mild hypoglycemia may not cause any symptoms. If you do have symptoms, they may include:  Hunger.  Anxiety.  Sweating and feeling clammy.  Dizziness or feeling light-headed.  Sleepiness.  Nausea.  Increased heart rate.  Headache.  Blurry vision.  Irritability.  Tingling or numbness around the mouth, lips, or tongue.  A change in coordination.  Restless sleep. If mild hypoglycemia is not recognized and treated, it can quickly become moderate or severe hypoglycemia. Moderate symptoms Moderate hypoglycemia can cause:  Mental confusion and poor judgment.  Behavior changes.  Weakness.  Irregular heartbeat. Severe symptoms Severe hypoglycemia is a medical emergency. It can cause:  Fainting.  Seizures.  Loss of consciousness  (coma).  Death. What nutrition changes can be made?  Work with your health care provider or diet and nutrition specialist (dietitian) to make a healthy meal plan that is right for you. Follow your meal plan carefully.  Eat meals at regular times.  If recommended by your health care provider, have snacks between meals.  Donot skip or delay meals or snacks. You can be at risk for hypoglycemia if you are not getting enough carbohydrates. What lifestyle changes can be made?   Work closely with your health care provider to manage your blood glucose. Make sure you know: ? Your goal blood glucose levels. ? How and when to check your blood glucose. ? The symptoms of hypoglycemia. It is important to treat it right away to keep it from becoming severe.  Do not drink alcohol on an empty stomach.  When you are ill, check your blood glucose more often than usual. Follow your sick day plan whenever you cannot eat or drink normally. Make this plan in advance with your health care provider.  Always check your blood glucose before, during, and after exercise. How is this treated? This condition can often be treated by immediately eating or drinking something that contains sugar, such as:  Fruit juice, 4-6 oz (120-150 mL).  Regular (not diet) soda, 4-6 oz (120-150 mL).  Low-fat milk, 4 oz (120 mL).  Several pieces of hard candy.  Sugar or honey, 1 Tbsp (15 mL). Treating hypoglycemia if you have diabetes If you are alert and able to swallow safely, follow the 15:15 rule:  Take 15 grams of a rapid-acting carbohydrate. Talk with your health care provider about how much you should take.  Rapid-acting options include: ? Glucose pills (take 15 grams). ? 6-8 pieces of hard candy. ? 4-6 oz (120-150 mL) of fruit juice. ? 4-6 oz (120-150 mL) of regular (not diet) soda.  Check your blood glucose 15 minutes after you take the carbohydrate.  If the repeat blood glucose level is still at or below  70 mg/dL (3.9 mmol/L), take 15 grams of a carbohydrate again.  If your blood glucose level does not increase above 70 mg/dL (3.9 mmol/L) after 3 tries, seek emergency medical care.  After your blood glucose level returns to normal, eat a meal or a snack within 1 hour. Treating severe hypoglycemia Severe hypoglycemia is when your blood glucose level is at or below 54 mg/dL (3 mmol/L). Severe hypoglycemia is a medical emergency. Get medical help right away. If you have severe hypoglycemia and you cannot eat or drink, you may need an injection of glucagon. A family member or close friend should learn how to check your blood glucose and how to give you a glucagon injection. Ask your health care provider if you need to have an emergency glucagon injection kit available. Severe hypoglycemia may need to be treated in a hospital. The treatment may include getting glucose through an IV. You may also need treatment for the cause of your hypoglycemia. Where to find more information  American Diabetes Association: www.diabetes.[employer] of Diabetes and Digestive and Kidney Diseases: [employer] Contact a health care provider if:  You have problems keeping your blood glucose in your target range.  You have frequent episodes of hypoglycemia. Get help right away if:  You continue to have hypoglycemia symptoms after eating or drinking something containing glucose.  Your blood glucose level is at or below 54 mg/dL (3 mmol/L).  You faint.  You have a seizure. These symptoms may represent a serious problem that is an emergency. Do not wait to see if the symptoms will go away. Get medical help right away. Call your local emergency services (911 in the [place] Summary  Know the symptoms of hypoglycemia, and when you are at risk for it (such as during exercise or when you are sick). Check your blood glucose often when you are at risk for hypoglycemia.  Hypoglycemia can develop quickly,  and it can be dangerous if it is not treated right away. If you have a history of severe hypoglycemia, make sure you know how to use your glucagon injection kit.  Make sure you know how to treat hypoglycemia. Keep a carbohydrate snack available when you may be at risk for hypoglycemia. This information is not intended to replace advice given to you by your health care provider. Make sure you discuss any questions you have with your health care provider. Document Revised: 08/09/2018 Document Reviewed: 12/13/2016 Elsevier Patient Education  [place].

## 2019-11-18 MED ORDER — GLUCOSE BLOOD VI STRP: ORAL_STRIP | 3 refills | Status: DC

## 2019-11-18 NOTE — Telephone Encounter (Signed)
erx has been sent.  

## 2019-11-20 DIAGNOSIS — L89891 Pressure ulcer of other site, stage 1: Secondary | ICD-10-CM | POA: Diagnosis not present

## 2019-11-20 DIAGNOSIS — E1151 Type 2 diabetes mellitus with diabetic peripheral angiopathy without gangrene: Secondary | ICD-10-CM | POA: Diagnosis not present

## 2019-11-20 DIAGNOSIS — S98132A Complete traumatic amputation of one left lesser toe, initial encounter: Secondary | ICD-10-CM | POA: Diagnosis not present

## 2019-11-27 DIAGNOSIS — E1151 Type 2 diabetes mellitus with diabetic peripheral angiopathy without gangrene: Secondary | ICD-10-CM | POA: Diagnosis not present

## 2019-11-27 DIAGNOSIS — L89891 Pressure ulcer of other site, stage 1: Secondary | ICD-10-CM | POA: Diagnosis not present

## 2019-12-02 ENCOUNTER — Ambulatory Visit: Payer: PPO | Admitting: Cardiology

## 2019-12-03 ENCOUNTER — Ambulatory Visit: Payer: PPO | Admitting: Cardiology

## 2019-12-08 ENCOUNTER — Other Ambulatory Visit: Payer: Self-pay

## 2019-12-08 ENCOUNTER — Encounter: Payer: Self-pay | Admitting: *Deleted

## 2019-12-08 ENCOUNTER — Encounter: Payer: Self-pay | Admitting: Cardiology

## 2019-12-08 ENCOUNTER — Ambulatory Visit: Payer: PPO | Admitting: Cardiology

## 2019-12-08 VITALS — BP 126/62 | HR 54 | Ht 65.0 in | Wt 171.0 lb

## 2019-12-08 DIAGNOSIS — D6869 Other thrombophilia: Secondary | ICD-10-CM

## 2019-12-08 DIAGNOSIS — I4819 Other persistent atrial fibrillation: Secondary | ICD-10-CM | POA: Diagnosis not present

## 2019-12-08 DIAGNOSIS — I342 Nonrheumatic mitral (valve) stenosis: Secondary | ICD-10-CM | POA: Diagnosis not present

## 2019-12-08 DIAGNOSIS — Z01812 Encounter for preprocedural laboratory examination: Secondary | ICD-10-CM

## 2019-12-08 LAB — BASIC METABOLIC PANEL
BUN/Creatinine Ratio: 19 (ref 12–28)
BUN: 17 mg/dL (ref 8–27)
CO2: 25 mmol/L (ref 20–29)
Calcium: 9.9 mg/dL (ref 8.7–10.3)
Chloride: 99 mmol/L (ref 96–106)
Creatinine, Ser: 0.88 mg/dL (ref 0.57–1.00)
GFR calc Af Amer: 81 mL/min/{1.73_m2} (ref >59)
GFR calc non Af Amer: 71 mL/min/{1.73_m2} (ref >59)
Glucose: 95 mg/dL (ref 65–99)
Potassium: 3.5 mmol/L (ref 3.5–5.2)
Sodium: 138 mmol/L (ref 134–144)

## 2019-12-08 LAB — CBC
Hematocrit: 41.9 % (ref 34.0–46.6)
Hemoglobin: 13.9 g/dL (ref 11.1–15.9)
MCH: 27.9 pg (ref 26.6–33.0)
MCHC: 33.2 g/dL (ref 31.5–35.7)
MCV: 84 fL (ref 79–97)
Platelets: 247 10*3/uL (ref 150–450)
RBC: 4.99 x10E6/uL (ref 3.77–5.28)
RDW: 15.3 % (ref 11.7–15.4)
WBC: 9.8 10*3/uL (ref 3.4–10.8)

## 2019-12-08 NOTE — Progress Notes (Signed)
Cardiology Office Note:    Date:  12/08/2019   ID:  Theresa Harrington, DOB 11/01/1955, MRN [id number]  PCP:  Jones, Thomas L, MD  [facility] Cardiologist:  Ashantee Deupree, MD  [facility] Electrophysiologist:  None   Referring MD: Jones, Thomas L, MD     History of Present Illness:    Theresa Harrington is a 64 y.o. female here today once again to discuss potential transesophageal echocardiogram to evaluate mitral stenosis seen on echocardiogram.  Also here for the follow-up of paroxysmal atrial fibrillation peripheral arterial disease with femoropopliteal bypass in 2016 diabetes with hypertension.  Had cardioversion but was asymptomatic.  Ended up going back into rate controlled atrial fibrillation.  Never apparent to her.  Asymptomatic.  On 12/08/2019 however, today, and sinus bradycardia 54.  She admits that she is not taking the diltiazem.  Hemoglobin 15.3 LDL 64 creatinine 0.9  Had moderate mitral stenosis previously on echocardiogram-could indicate valvular A. fib if severe.  Left atrial diameter 4.9.  She reports today that she has not been taking Cartia.  I showed her diltiazem/Cardizem CD on her medication list she states that she has not been taking this.  Her heart rate currently is 54 bpm.  We answered all questions about transesophageal echocardiogram.  She has had several endoscopies, EGDs in the past.  Past Medical History:  Diagnosis Date  . Allergy   . Anxiety    Pt. denies  . Arthritis    right index figer  . Asymptomatic cholelithiasis   . Atherosclerosis of aorta (HCC)   . Cataract   . Clotting disorder (HCC)   . Gallstones 06/2013  . GERD (gastroesophageal reflux disease)   . History of kidney stones    lithotrispy  . Hyperlipidemia   . Hypertension   . Hypothyroidism    no meds  . Iron deficiency anemia   . PAD (peripheral artery disease) (HCC)   . PAF (paroxysmal atrial fibrillation) (HCC)   . Peripheral arterial occlusive disease (HCC)    lower extremities   . Pneumonia   . PONV (postoperative nausea and vomiting)   . Right ureteral stone   . Type 2 diabetes mellitus (HCC)    Type  . Vitamin B 12 deficiency   . Wears glasses     Past Surgical History:  Procedure Laterality Date  . AMPUTATION Left 07/16/2014   Procedure: LEFT SECOND TOE AMPUTATION;  Surgeon: Vance W Brabham, MD;  [facility]: [facility];  Service: Vascular;  Laterality: Left;  With Nerve block  . AUGMENTATION MAMMAPLASTY    . BELPHAROPTOSIS REPAIR     eyelid lift  . BIOPSY  10/14/2018   Procedure: BIOPSY;  Surgeon: Mansouraty, Gabriel Jr., MD;  Location: [facility];  Service: Gastroenterology;;  . BIOPSY  08/04/2019   Procedure: BIOPSY;  Surgeon: Mansouraty, Gabriel Jr., MD;  [facility]: [facility];  Service: Gastroenterology;;  . CARDIOVERSION N/A 09/19/2019   Procedure: CARDIOVERSION;  Surgeon: Christopher, Bridgette, MD;  [facility]: [facility];  Service: Cardiovascular;  Laterality: N/A;  . COLONOSCOPY    . COMBINED AUGMENTATION MAMMAPLASTY AND ABDOMINOPLASTY  2009   W/  BILATERAL  THIGH LIFT  . CYSTO/  RIGHT URETERAL STENT PLACEMENT  12-30-2010  . CYSTOSCOPY WITH RETROGRADE PYELOGRAM, URETEROSCOPY AND STENT PLACEMENT Right 10/07/2013   Procedure: CYSTOSCOPY WITH RETROGRADE PYELOGRAM, right URETEROSCOPY AND STENT PLACEMENT, stone extraction;  Surgeon: Marc H Nesi, MD;  Location: [facility];  Service: Urology;  Laterality: Right;  . CYSTOSCOPY WITH STENT PLACEMENT Right 06/04/2013     Procedure: CYSTOSCOPY WITH STENT PLACEMENT;  Surgeon: Franchot Gallo, MD;  Location: [facility];  Service: Urology;  Laterality: Right;   DILATATION & CURETTAGE/HYSTEROSCOPY WITH MYOSURE N/A 04/27/2015   Procedure: DILATATION & CURETTAGE/HYSTEROSCOPY WITH MYOSURE;  Surgeon: Terrance Mass, MD;  Location: [facility];  Service: Gynecology;  Laterality: N/A;   ENDARTERECTOMY FEMORAL Left 06/08/2014   Procedure: Left Leg Common Femoral and External Iliac  Endartarectomy with patch Angioplasty;   Surgeon: Rosetta Posner, MD;  Location: [facility];  Service: Vascular;  Laterality: Left;   ESOPHAGOGASTRODUODENOSCOPY N/A 10/14/2018   Procedure: ESOPHAGOGASTRODUODENOSCOPY (EGD);  Surgeon: Irving Copas., MD;  Location: [place];  Service: Gastroenterology;  Laterality: N/A;   ESOPHAGOGASTRODUODENOSCOPY (EGD) WITH PROPOFOL N/A 11/06/2018   Procedure: ESOPHAGOGASTRODUODENOSCOPY (EGD) WITH PROPOFOL;  Surgeon: Rush Landmark Telford Nab., MD;  [facility];  Service: Gastroenterology;  Laterality: N/A;  RFA   ESOPHAGOGASTRODUODENOSCOPY (EGD) WITH PROPOFOL N/A 02/12/2019   Procedure: ESOPHAGOGASTRODUODENOSCOPY (EGD) WITH PROPOFOL;  Surgeon: Rush Landmark Telford Nab., MD;  [facility];  Service: Gastroenterology;  Laterality: N/A;   ESOPHAGOGASTRODUODENOSCOPY (EGD) WITH PROPOFOL N/A 08/04/2019   Procedure: ESOPHAGOGASTRODUODENOSCOPY (EGD) WITH PROPOFOL;  Surgeon: Rush Landmark Telford Nab., MD;  Location: [place];  Service: Gastroenterology;  Laterality: N/A;  WITH RFA   EXTRACORPOREAL SHOCK WAVE LITHOTRIPSY Right 08-04-2013//   06-23-2013//   01-16-2011   EYE SURGERY Bilateral    cataract   FEMORAL-POPLITEAL BYPASS GRAFT Left 06/08/2014   Procedure: Left Leg Femoral -Popliteal Bypass Graft;  Surgeon: Rosetta Posner, MD;  Location[facility];  Service: Vascular;  Laterality: Left;   FEMORAL-POPLITEAL BYPASS GRAFT Left 06/09/2014   Procedure: Left Femoral and Popliteal Exposure; Left Femoral to Anterior Tibial Bypass Graft using Propaten 46m by 80cm Goretex Graft; Left Tibial Endarterectomy; Left Femoraland Popliteal Thrombectomy ;  Surgeon: VSerafina Mitchell MD;  [facility]  Service: Vascular;  Laterality: Left;   GI RADIOFREQUENCY ABLATION N/A 11/06/2018   Procedure: GI RADIOFREQUENCY ABLATION;  Surgeon: MIrving Copas, MD;  [facility];  Service: Gastroenterology;  Laterality: N/A;   GI RADIOFREQUENCY ABLATION N/A 02/12/2019   Procedure: GI RADIOFREQUENCY  ABLATION;  Surgeon: MRush LandmarkGTelford Nab, MD;  [facility];  Service: Gastroenterology;  Laterality: N/A;   GI RADIOFREQUENCY ABLATION N/A 08/04/2019   Procedure: GI RADIOFREQUENCY ABLATION;  Surgeon: MRush LandmarkGTelford Nab, MD;  [place] [facility]  Service: Gastroenterology;  Laterality: N/A;   HOLMIUM LASER APPLICATION Right 60/12/3816  Procedure: HOLMIUM LASER APPLICATION;  Surgeon: MArvil Persons MD;  [facility]  Service: Urology;  Laterality: Right;   KIDNEY STONE SURGERY  April 2015   1-2 stones   LAPAROSCOPIC GASTRIC BANDING  05-29-2005   LITHOTRIPSY  2-3 times   LOWER EXTREMITY ANGIOGRAM N/A 06/04/2014   Procedure: LOWER EXTREMITY ANGIOGRAM;  Surgeon: VSerafina Mitchell MD;  [facility];  Service: Cardiovascular;  Laterality: N/A;   ORIF FIFTH METACARPAL FX  RIGHT HAND  04-21-2002   REVISION AND RE-SITING LAP-BAND PORT  04-08-2010   W/  UPPER EGD   RIGHT KNEE PATELLECTOMY W/ REPAIR OF EXTENSOR MECHANISM  04-14-2002   Toenail removed Left Jan. 21, 2016   2nd toenail-  Dr. PBarkley Bruns   Current Medications: Current Meds  Medication Sig   AMBULATORY NON FORMULARY MEDICATION 980mViscous lidocaine, 9077m0mg/5ml Diyclomine, 270m68mlox Swish and Swallow 30mL60mmouth four times daily.   Blood Glucose Monitoring Suppl (ONETOUCH VERIO IQ SYSTEM) w/Device KIT USE TO CHECK SUGAR DAILY  dorzolamide-timolol (COSOPT) 22.3-6.8 MG/ML ophthalmic solution Place 1 drop into the right eye 2 (two) times daily.   . Empagliflozin-metFORMIN HCl ER (SYNJARDY XR) 12.08-998 MG TB24 Take 1 tablet by mouth daily.  . ezetimibe (ZETIA) 10 MG tablet Take 1 tablet (10 mg total) by mouth at bedtime.  . glucose blood test strip Use to check blood sugar twice daily. DX: E11.8  . Insulin Glargine (BASAGLAR KWIKPEN) 100 UNIT/ML Inject 0.3 mLs (30 Units total) into the skin daily.  . insulin lispro (HUMALOG) 100 UNIT/ML injection Inject 14 Units into the  skin 3 (three) times daily before meals.  . Insulin Pen Needle (PEN NEEDLES) 32G X 4 MM MISC Inject 1 pen as directed 4 (four) times daily. Use to inject levemir or novolog as directed.  . latanoprost (XALATAN) 0.005 % ophthalmic solution Place 1 drop into both eyes at bedtime.   . losartan-hydrochlorothiazide (HYZAAR) 100-12.5 MG tablet Take 1 tablet by mouth daily.  . magnesium oxide (MAG-OX) 400 MG tablet Take 1 tablet (400 mg total) by mouth at bedtime.  . nebivolol (BYSTOLIC) 5 MG tablet Take 1 tablet (5 mg total) by mouth daily.  . omeprazole (PRILOSEC) 40 MG capsule Take 1 capsule (40 mg total) by mouth 2 (two) times daily before a meal.  . potassium chloride SA (KLOR-CON) 20 MEQ tablet Take 1 tablet (20 mEq total) by mouth daily.  . pravastatin (PRAVACHOL) 40 MG tablet Take 1 tablet (40 mg total) by mouth daily.  . rivaroxaban (XARELTO) 20 MG TABS tablet Take 1 tablet (20 mg total) by mouth daily.  . Semaglutide,0.25 or 0.5MG/DOS, (OZEMPIC, 0.25 OR 0.5 MG/DOSE,) 2 MG/1.5ML SOPN Inject 0.5 mg into the skin once a week.  . thiamine 100 MG tablet Take 1 tablet (100 mg total) by mouth every other day.  . VICTOZA 18 MG/3ML SOPN INJECT 1.8 MG INTO THE SKIN DAILY  . [DISCONTINUED] diltiazem (CARDIZEM CD) 180 MG 24 hr capsule Take 1 capsule (180 mg total) by mouth daily.     Allergies:   Amlodipine, Atorvastatin, Ampicillin, Codeine, Lisinopril, Penicillins, and Januvia [sitagliptin]   Social History   Socioeconomic History  . Marital status: Single    Spouse name: Not on file  . Number of children: Not on file  . Years of education: Not on file  . Highest education level: Not on file  Occupational History  . Occupation: tester    Employer: [employer]  Tobacco Use  . Smoking status: Former Smoker    Packs/day: 1.00    Years: 28.00    Pack years: 28.00    Types: Cigarettes    Quit date: 09/05/2001    Years since quitting: 18.2  . Smokeless tobacco: Never Used  Vaping Use   . Vaping Use: Never used  Substance and Sexual Activity  . Alcohol use: No    Alcohol/week: 0.0 standard drinks  . Drug use: No  . Sexual activity: Yes  Other Topics Concern  . Not on file  Social History Narrative   Regular exercise- yes   Social Determinants of Health   Financial Resource Strain: High Risk  . Difficulty of Paying Living Expenses: Hard  Food Insecurity:   . Worried About Running Out of Food in the Last Year:   . Ran Out of Food in the Last Year:   Transportation Needs:   . Lack of Transportation (Medical):   . Lack of Transportation (Non-Medical):   Physical Activity:   . Days of Exercise per Week:   .   Minutes of Exercise per Session:   Stress:    Feeling of Stress :   Social Connections:    Frequency of Communication with Friends and Family:    Frequency of Social Gatherings with Friends and Family:    Attends Religious Services:    Active Member of Clubs or Organizations:    Attends Music therapist:    Marital Status:      Family History: The patient's family history includes Arthritis in an other family member; Cancer in her brother and another family member; Colon cancer in her maternal aunt; Deep vein thrombosis in her father and sister; Diabetes in her brother, brother, brother, father, and sister; Heart disease in her brother, father, and sister; Hyperlipidemia in her brother, father, mother, and sister; Hypertension in an other family member; Kidney disease in her mother; Other in her mother; Stroke in an other family member. There is no history of Esophageal cancer, Stomach cancer, Inflammatory bowel disease, Liver disease, or Pancreatic cancer.  ROS:   Please see the history of present illness.     All other systems reviewed and are negative.  EKGs/Labs/Other Studies Reviewed:    The following studies were reviewed today: No reviewed  EKG today shows sinus bradycardia rate 54 with no other abnormalities.  Recent  Labs: 06/04/2019: TSH 0.42 09/04/2019: Magnesium 1.8; Platelets 293 09/19/2019: BUN 16; Creatinine, Ser 0.90; Hemoglobin 15.3; Potassium 3.2; Sodium 141  Recent Lipid Panel    Component Value Date/Time   CHOL 134 06/04/2019 1122   TRIG 114.0 06/04/2019 1122   HDL 47.90 06/04/2019 1122   CHOLHDL 3 06/04/2019 1122   VLDL 22.8 06/04/2019 1122   LDLCALC 64 06/04/2019 1122   LDLDIRECT 160.2 03/01/2010 1001    Physical Exam:    VS:  BP 126/62    Pulse (!) 54    Ht _0  (1.651 m)    Wt 171 lb (77.6 kg)    LMP  (LMP Unknown)    SpO2 97%    BMI 28.46 kg/m     Wt Readings from Last 3 Encounters:  12/08/19 171 lb (77.6 kg)  10/09/19 171 lb (77.6 kg)  10/02/19 175 lb (79.4 kg)     GEN: Well nourished, well developed, in no acute distress  HEENT: normal  Neck: no JVD, carotid bruits, or masses Cardiac: RRR; no murmurs, rubs, or gallops,no edema  Respiratory:  clear to auscultation bilaterally, normal work of breathing GI: soft, nontender, nondistended, + BS MS: no deformity or atrophy  Skin: warm and dry, no rash Neuro:  Alert and Oriented x 3, Strength and sensation are intact Psych: euthymic mood, full affect   ASSESSMENT:    1. Nonrheumatic mitral valve stenosis   2. Persistent atrial fibrillation ([place])   3. Secondary hypercoagulable state ([place])   4. Pre-procedure lab exam    PLAN:    In order of problems listed above:  Mitral stenosis -Previously described as moderate on echocardiogram.  She is here again today to discuss transesophageal echocardiogram to further evaluate mitral valve.  Previously has rate controlled atrial fibrillation with Bystolic 5 mg once a day and diltiazem. -Today's EKG shows sinus bradycardia rate 54 bpm.  She states that she is off of her diltiazem, not taking it.  We will take it off of her medication list. -If the mitral stenosis were severe, occasionally we may need to rethink our anticoagulation of choice.  Currently on Xarelto 20 mg a day.  She  understands this. -It may  be very challenging for her to continue with sinus rhythm with her underlying moderate mitral stenosis however. -We will go ahead and proceed with transesophageal echocardiogram.  Risk and benefits have been explained.  She is willing to proceed.  Diabetes with hypertension, peripheral arterial disease -Followed by Dr. [name] and his team.  Medications reviewed.  Follow up 1 month post TEE.  Medication Adjustments/Labs and Tests Ordered: Current medicines are reviewed at length with the patient today.  Concerns regarding medicines are outlined above.  Orders Placed This Encounter  Procedures   CBC   Basic metabolic panel   EKG 67-RFFM   No orders of the defined types were placed in this encounter.   Patient Instructions  Medication Instructions:  The current medical regimen is effective;  continue present plan and medications.  *If you need a refill on your cardiac medications before your next appointment, please call your pharmacy*  Lab Work: Please have blood work today  (CBC, BMP)  If you have labs (blood work) drawn today and your tests are completely normal, you will receive your results only by:  MyChart Message (if you have [place]) OR  A paper copy in the mail If you have any lab test that is abnormal or we need to change your treatment, we will call you to review the results.  Due to recent COVID-19 restrictions implemented by our local and state authorities and in an effort to keep both patients and staff as safe as possible, our hospital system requires COVID-19 testing prior to certain scheduled hospital procedures.  Please go to [address]. [place], [place] [phone] on 12/22/2019 at  11:30 am  .  This is a drive up testing site.  You will not need to exit your vehicle.  You will not be billed at the time of testing but may receive a bill later depending on your insurance.  The approximate cost of the test is $100.  You must agree to  self-quarantine from the time of your testing until the procedure date on 12/24/2019.  This should included staying home with ONLY the people you live with.  Avoid take-out, grocery store shopping or leaving the house for any non-emergent reason.  Failure to have your COVID-19 test done on the date and time you have been scheduled will result in cancellation of your procedure.  Please call our office at [phone] if you have any questions.  Testing/Procedures: Your physician has requested that you have a TEE. During a TEE, sound waves are used to create images of your heart. It provides your doctor with information about the size and shape of your heart and how well your hearts chambers and valves are working. In this test, a transducer is attached to the end of a flexible tube thats guided down your throat and into your esophagus (the tube leading from you mouth to your stomach) to get a more detailed image of your heart. You are not awake for the procedure. Please see the instruction sheet given to you today.   Follow-Up: At [facility], you and your health needs are our priority.  As part of our continuing mission to provide you with exceptional heart care, we have created designated Provider Care Teams.  These Care Teams include your primary Cardiologist (physician) and Advanced Practice Providers (APPs -  Physician Assistants and Nurse Practitioners) who all work together to provide you with the care you need, when you need it.  We recommend signing up for the patient  portal called "MyChart".  Sign up information is provided on this After Visit Summary.  MyChart is used to connect with patients for Virtual Visits (Telemedicine).  Patients are able to view lab/test results, encounter notes, upcoming appointments, etc.  Non-urgent messages can be sent to your provider as well.   To learn more about what you can do with MyChart, go to [email or web address].    Your next appointment:   1  month(s)  The format for your next appointment:   In Person  Provider:   [name], MD   Thank you for choosing [facility]!!        Signed, [name], MD  12/08/2019 10:39 AM    [place]

## 2019-12-08 NOTE — Patient Instructions (Addendum)
Medication Instructions:  The current medical regimen is effective;  continue present plan and medications.  *If you need a refill on your cardiac medications before your next appointment, please call your pharmacy*  Lab Work: Please have blood work today  (CBC, BMP)  If you have labs (blood work) drawn today and your tests are completely normal, you will receive your results only by: [name] MyChart Message (if you have MyChart) OR . A paper copy in the mail If you have any lab test that is abnormal or we need to change your treatment, we will call you to review the results.  Due to recent COVID-19 restrictions implemented by our local and state authorities and in an effort to keep both patients and staff as safe as possible, our hospital system requires COVID-19 testing prior to certain scheduled hospital procedures.  Please go to [address]. [place], [place] [id number] on 12/22/2019 at  11:30 am  .  This is a drive up testing site.  You will not need to exit your vehicle.  You will not be billed at the time of testing but may receive a bill later depending on your insurance.  The approximate cost of the test is $100.  You must agree to self-quarantine from the time of your testing until the procedure date on 12/24/2019.  This should included staying home with ONLY the people you live with.  Avoid take-out, grocery store shopping or leaving the house for any non-emergent reason.  Failure to have your COVID-19 test done on the date and time you have been scheduled will result in cancellation of your procedure.  Please call our office at [phone] if you have any questions.  Testing/Procedures: Your physician has requested that you have a TEE. During a TEE, sound waves are used to create images of your heart. It provides your doctor with information about the size and shape of your heart and how well your heart's chambers and valves are working. In this test, a transducer is attached to the end of a  flexible tube that's guided down your throat and into your esophagus (the tube leading from you mouth to your stomach) to get a more detailed image of your heart. You are not awake for the procedure. Please see the instruction sheet given to you today.   Follow-Up: At [facility], you and your health needs are our priority.  As part of our continuing mission to provide you with exceptional heart care, we have created designated Provider Care Teams.  These Care Teams include your primary Cardiologist (physician) and Advanced Practice Providers (APPs -  Physician Assistants and Nurse Practitioners) who all work together to provide you with the care you need, when you need it.  We recommend signing up for the patient portal called "MyChart".  Sign up information is provided on this After Visit Summary.  MyChart is used to connect with patients for Virtual Visits (Telemedicine).  Patients are able to view lab/test results, encounter notes, upcoming appointments, etc.  Non-urgent messages can be sent to your provider as well.   To learn more about what you can do with MyChart, go to [email or web address].    Your next appointment:   1 month(s)  The format for your next appointment:   In Person  Provider:   [name], MD   Thank you for choosing [facility]!!

## 2019-12-08 NOTE — H&P (View-Only) (Signed)
Cardiology Office Note:    Date:  12/08/2019   ID:  Theresa Harrington, DOB [birth year], MRN [id number]  PCP:  [name], MD  [facility] Cardiologist:  [name], MD  [facility] Electrophysiologist:  None   Referring MD: [name], MD     History of Present Illness:    Theresa Harrington is a 64 y.o. female here today once again to discuss potential transesophageal echocardiogram to evaluate mitral stenosis seen on echocardiogram.  Also here for the follow-up of paroxysmal atrial fibrillation peripheral arterial disease with femoropopliteal bypass in 2016 diabetes with hypertension.  Had cardioversion but was asymptomatic.  Ended up going back into rate controlled atrial fibrillation.  Never apparent to her.  Asymptomatic.  On 12/08/2019 however, today, and sinus bradycardia 54.  She admits that she is not taking the diltiazem.  Hemoglobin 15.3 LDL 64 creatinine 0.9  Had moderate mitral stenosis previously on echocardiogram-could indicate valvular A. fib if severe.  Left atrial diameter 4.9.  She reports today that she has not been taking [place].  I showed her diltiazem/Cardizem CD on her medication list she states that she has not been taking this.  Her heart rate currently is 54 bpm.  We answered all questions about transesophageal echocardiogram.  She has had several endoscopies, EGDs in the past.  Past Medical History:  Diagnosis Date  . Allergy   . Anxiety    Pt. denies  . Arthritis    right index figer  . Asymptomatic cholelithiasis   . Atherosclerosis of aorta ([place])   . Cataract   . Clotting disorder ([place])   . Gallstones 06/2013  . GERD (gastroesophageal reflux disease)   . History of kidney stones    lithotrispy  . Hyperlipidemia   . Hypertension   . Hypothyroidism    no meds  . Iron deficiency anemia   . PAD (peripheral artery disease) ([place])   . PAF (paroxysmal atrial fibrillation) ([place])   . Peripheral arterial occlusive disease (HCC)    lower extremities   . Pneumonia   . PONV (postoperative nausea and vomiting)   . Right ureteral stone   . Type 2 diabetes mellitus (HCC)    Type  . Vitamin B 12 deficiency   . Wears glasses     Past Surgical History:  Procedure Laterality Date  . AMPUTATION Left 07/16/2014   Procedure: LEFT SECOND TOE AMPUTATION;  Surgeon: Serafina Mitchell, MD;  Location: [place];  Service: Vascular;  Laterality: Left;  With Nerve block  . AUGMENTATION MAMMAPLASTY    . BELPHAROPTOSIS REPAIR     eyelid lift  . BIOPSY  10/14/2018   Procedure: BIOPSY;  Surgeon: Rush Landmark Telford Nab., MD;  Location: [place];  Service: Gastroenterology;;  . BIOPSY  08/04/2019   Procedure: BIOPSY;  Surgeon: Irving Copas., MD;  Location: [place];  Service: Gastroenterology;;  . CARDIOVERSION N/A 09/19/2019   Procedure: CARDIOVERSION;  Surgeon: Buford Dresser, MD;  Location: [facility] ENDOSCOPY;  Service: Cardiovascular;  Laterality: N/A;  . COLONOSCOPY    . COMBINED AUGMENTATION MAMMAPLASTY AND ABDOMINOPLASTY  2009   W/  BILATERAL  THIGH LIFT  . CYSTO/  RIGHT URETERAL STENT PLACEMENT  12-30-2010  . CYSTOSCOPY WITH RETROGRADE PYELOGRAM, URETEROSCOPY AND STENT PLACEMENT Right 10/07/2013   Procedure: CYSTOSCOPY WITH RETROGRADE PYELOGRAM, right URETEROSCOPY AND STENT PLACEMENT, stone extraction;  Surgeon: Arvil Persons, MD;  Location[facility];  Service: Urology;  Laterality: Right;  . CYSTOSCOPY WITH STENT PLACEMENT Right 06/04/2013  Procedure: CYSTOSCOPY WITH STENT PLACEMENT;  Surgeon: Franchot Gallo, MD;  Location: [facility];  Service: Urology;  Laterality: Right;  . DILATATION & CURETTAGE/HYSTEROSCOPY WITH MYOSURE N/A 04/27/2015   Procedure: DILATATION & CURETTAGE/HYSTEROSCOPY WITH MYOSURE;  Surgeon: Terrance Mass, MD;  Location: [place] [facility];  Service: Gynecology;  Laterality: N/A;  . ENDARTERECTOMY FEMORAL Left 06/08/2014   Procedure: Left Leg Common Femoral and External Iliac  Endartarectomy with patch Angioplasty;   Surgeon: Rosetta Posner, MD;  Location: [facility] OR;  Service: Vascular;  Laterality: Left;  . ESOPHAGOGASTRODUODENOSCOPY N/A 10/14/2018   Procedure: ESOPHAGOGASTRODUODENOSCOPY (EGD);  Surgeon: Irving Copas., MD;  Location: [place];  Service: Gastroenterology;  Laterality: N/A;  . ESOPHAGOGASTRODUODENOSCOPY (EGD) WITH PROPOFOL N/A 11/06/2018   Procedure: ESOPHAGOGASTRODUODENOSCOPY (EGD) WITH PROPOFOL;  Surgeon: Rush Landmark Telford Nab., MD;  [facility];  Service: Gastroenterology;  Laterality: N/A;  RFA  . ESOPHAGOGASTRODUODENOSCOPY (EGD) WITH PROPOFOL N/A 02/12/2019   Procedure: ESOPHAGOGASTRODUODENOSCOPY (EGD) WITH PROPOFOL;  Surgeon: Rush Landmark Telford Nab., MD;  [facility];  Service: Gastroenterology;  Laterality: N/A;  . ESOPHAGOGASTRODUODENOSCOPY (EGD) WITH PROPOFOL N/A 08/04/2019   Procedure: ESOPHAGOGASTRODUODENOSCOPY (EGD) WITH PROPOFOL;  Surgeon: Rush Landmark Telford Nab., MD;  Location: [place];  Service: Gastroenterology;  Laterality: N/A;  WITH RFA  . EXTRACORPOREAL SHOCK WAVE LITHOTRIPSY Right 08-04-2013//   06-23-2013//   01-16-2011  . EYE SURGERY Bilateral    cataract  . FEMORAL-POPLITEAL BYPASS GRAFT Left 06/08/2014   Procedure: Left Leg Femoral -Popliteal Bypass Graft;  Surgeon: Rosetta Posner, MD;  Location: [place];  Service: Vascular;  Laterality: Left;  . FEMORAL-POPLITEAL BYPASS GRAFT Left 06/09/2014   Procedure: Left Femoral and Popliteal Exposure; Left Femoral to Anterior Tibial Bypass Graft using Propaten 76m by 80cm Goretex Graft; Left Tibial Endarterectomy; Left Femoraland Popliteal Thrombectomy ;  Surgeon: VSerafina Mitchell MD;  [place]  Service: Vascular;  Laterality: Left;  . GI RADIOFREQUENCY ABLATION N/A 11/06/2018   Procedure: GI RADIOFREQUENCY ABLATION;  Surgeon: MIrving Copas, MD;  [facility];  Service: Gastroenterology;  Laterality: N/A;  . GI RADIOFREQUENCY ABLATION N/A 02/12/2019   Procedure: GI RADIOFREQUENCY  ABLATION;  Surgeon: MRush LandmarkGTelford Nab, MD;  [facility];  Service: Gastroenterology;  Laterality: N/A;  . GI RADIOFREQUENCY ABLATION N/A 08/04/2019   Procedure: GI RADIOFREQUENCY ABLATION;  Surgeon: MRush LandmarkGTelford Nab, MD;  [place]  Service: Gastroenterology;  Laterality: N/A;  . HOLMIUM LASER APPLICATION Right 65/07/979  Procedure: HOLMIUM LASER APPLICATION;  Surgeon: MArvil Persons MD;  [facility]  Service: Urology;  Laterality: Right;  . KIDNEY STONE SURGERY  April 2015   1-2 stones  . LAPAROSCOPIC GASTRIC BANDING  05-29-2005  . LITHOTRIPSY  2-3 times  . LOWER EXTREMITY ANGIOGRAM N/A 06/04/2014   Procedure: LOWER EXTREMITY ANGIOGRAM;  Surgeon: VSerafina Mitchell MD;  [facility];  Service: Cardiovascular;  Laterality: N/A;  . ORIF FIFTH METACARPAL FTaft RIGHT HAND  04-21-2002  . REVISION AND RE-SITING LAP-BAND PORT  04-08-2010   W/  UPPER EGD  . RIGHT KNEE PATELLECTOMY W/ REPAIR OF EXTENSOR MECHANISM  04-14-2002  . Toenail removed Left Jan. 21, 2016   2nd toenail-  Dr. PBarkley Bruns   Current Medications: Current Meds  Medication Sig  . AMBULATORY NON FORMULARY MEDICATION 932mViscous lidocaine, 9049m0mg/5ml Diyclomine, 270m72mlox Swish and Swallow 30mL62mmouth four times daily.  . Blood Glucose Monitoring Suppl (ONETOUCH VERIO IQ SYSTEM) w/Device KIT USE TO CHECK SUGAR DAILY  .  dorzolamide-timolol (COSOPT) 22.3-6.8 MG/ML ophthalmic solution Place 1 drop into the right eye 2 (two) times daily.   . Empagliflozin-metFORMIN HCl ER (SYNJARDY XR) 12.08-998 MG TB24 Take 1 tablet by mouth daily.  [name] ezetimibe (ZETIA) 10 MG tablet Take 1 tablet (10 mg total) by mouth at bedtime.  [name] glucose blood test strip Use to check blood sugar twice daily. DX: E11.8  . Insulin Glargine (BASAGLAR KWIKPEN) 100 UNIT/ML Inject 0.3 mLs (30 Units total) into the skin daily.  . insulin lispro (HUMALOG) 100 UNIT/ML injection Inject 14 Units into the  skin 3 (three) times daily before meals.  . Insulin Pen Needle (PEN NEEDLES) 32G X 4 MM MISC Inject 1 pen as directed 4 (four) times daily. Use to inject levemir or novolog as directed.  . latanoprost (XALATAN) 0.005 % ophthalmic solution Place 1 drop into both eyes at bedtime.   [name] losartan-hydrochlorothiazide (HYZAAR) 100-12.5 MG tablet Take 1 tablet by mouth daily.  . magnesium oxide (MAG-OX) 400 MG tablet Take 1 tablet (400 mg total) by mouth at bedtime.  . nebivolol (BYSTOLIC) 5 MG tablet Take 1 tablet (5 mg total) by mouth daily.  [name] omeprazole (PRILOSEC) 40 MG capsule Take 1 capsule (40 mg total) by mouth 2 (two) times daily before a meal.  . potassium chloride SA (KLOR-CON) 20 MEQ tablet Take 1 tablet (20 mEq total) by mouth daily.  . pravastatin (PRAVACHOL) 40 MG tablet Take 1 tablet (40 mg total) by mouth daily.  . rivaroxaban (XARELTO) 20 MG TABS tablet Take 1 tablet (20 mg total) by mouth daily.  . Semaglutide,0.25 or 0.5MG/DOS, (OZEMPIC, 0.25 OR 0.5 MG/DOSE,) 2 MG/1.5ML SOPN Inject 0.5 mg into the skin once a week.  . thiamine 100 MG tablet Take 1 tablet (100 mg total) by mouth every other day.  [name] VICTOZA 18 MG/3ML SOPN INJECT 1.8 MG INTO THE SKIN DAILY  . [DISCONTINUED] diltiazem (CARDIZEM CD) 180 MG 24 hr capsule Take 1 capsule (180 mg total) by mouth daily.     Allergies:   Amlodipine, Atorvastatin, Ampicillin, Codeine, Lisinopril, Penicillins, and Januvia [sitagliptin]   Social History   Socioeconomic History  . Marital status: Single    Spouse name: Not on file  . Number of children: Not on file  . Years of education: Not on file  . Highest education level: Not on file  Occupational History  . 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 Smoking status: Former Smoker    Packs/day: 1.00    Years: 28.00    Pack years: 28.00    Types: Cigarettes    Quit date: 09/05/2001    Years since quitting: 18.2  . Smokeless tobacco: Never Used  Vaping Use   . Vaping Use: Never used  Substance and Sexual Activity  . Alcohol use: No    Alcohol/week: 0.0 standard drinks  . Drug use: No  . Sexual activity: Yes  Other Topics Concern  . Not on file  Social History Narrative   Regular exercise- yes   Social Determinants of Health   Financial Resource Strain: High Risk  . Difficulty of Paying Living Expenses: Hard  Food Insecurity:   . Worried About Charity fundraiser in the Last Year:   . Arboriculturist in the Last Year:   Transportation Needs:   . Film/video editor (Medical):   [name] Lack of Transportation (Non-Medical):   Physical Activity:   . Days of Exercise per Week:   .  Minutes of Exercise per Session:   Stress:   . Feeling of Stress :   Social Connections:   . Frequency of Communication with Friends and Family:   . Frequency of Social Gatherings with Friends and Family:   . Attends Religious Services:   . Active Member of Clubs or Organizations:   . Attends Archivist Meetings:   [name] Marital Status:      Family History: The patient's family history includes Arthritis in an other family member; Cancer in her brother and another family member; Colon cancer in her maternal aunt; Deep vein thrombosis in her father and sister; Diabetes in her brother, brother, brother, father, and sister; Heart disease in her brother, father, and sister; Hyperlipidemia in her brother, father, mother, and sister; Hypertension in an other family member; Kidney disease in her mother; Other in her mother; Stroke in an other family member. There is no history of Esophageal cancer, Stomach cancer, Inflammatory bowel disease, Liver disease, or Pancreatic cancer.  ROS:   Please see the history of present illness.     All other systems reviewed and are negative.  EKGs/Labs/Other Studies Reviewed:    The following studies were reviewed today: No reviewed  EKG today shows sinus bradycardia rate 54 with no other abnormalities.  Recent  Labs: 06/04/2019: TSH 0.42 09/04/2019: Magnesium 1.8; Platelets 293 09/19/2019: BUN 16; Creatinine, Ser 0.90; Hemoglobin 15.3; Potassium 3.2; Sodium 141  Recent Lipid Panel    Component Value Date/Time   CHOL 134 06/04/2019 1122   TRIG 114.0 06/04/2019 1122   HDL 47.90 06/04/2019 1122   CHOLHDL 3 06/04/2019 1122   VLDL 22.8 06/04/2019 1122   LDLCALC 64 06/04/2019 1122   LDLDIRECT 160.2 03/01/2010 1001    Physical Exam:    VS:  BP 126/62   Pulse (!) 54   Ht '5\' 5"'  (1.651 m)   Wt 171 lb (77.6 kg)   LMP  (LMP Unknown)   SpO2 97%   BMI 28.46 kg/m     Wt Readings from Last 3 Encounters:  12/08/19 171 lb (77.6 kg)  10/09/19 171 lb (77.6 kg)  10/02/19 175 lb (79.4 kg)     GEN: Well nourished, well developed, in no acute distress  HEENT: normal  Neck: no JVD, carotid bruits, or masses Cardiac: RRR; no murmurs, rubs, or gallops,no edema  Respiratory:  clear to auscultation bilaterally, normal work of breathing GI: soft, nontender, nondistended, + BS MS: no deformity or atrophy  Skin: warm and dry, no rash Neuro:  Alert and Oriented x 3, Strength and sensation are intact Psych: euthymic mood, full affect   ASSESSMENT:    1. Nonrheumatic mitral valve stenosis   2. Persistent atrial fibrillation ([place])   3. Secondary hypercoagulable state ([place])   4. Pre-procedure lab exam    PLAN:    In order of problems listed above:  Mitral stenosis -Previously described as moderate on echocardiogram.  She is here again today to discuss transesophageal echocardiogram to further evaluate mitral valve.  Previously has rate controlled atrial fibrillation with Bystolic 5 mg once a day and diltiazem. -Today's EKG shows sinus bradycardia rate 54 bpm.  She states that she is off of her diltiazem, not taking it.  We will take it off of her medication list. -If the mitral stenosis were severe, occasionally we may need to rethink our anticoagulation of choice.  Currently on Xarelto 20 mg a day.  She  understands this. -It may be very challenging for her to  continue with sinus rhythm with her underlying moderate mitral stenosis however. -We will go ahead and proceed with transesophageal echocardiogram.  Risk and benefits have been explained.  She is willing to proceed.  Diabetes with hypertension, peripheral arterial disease -Followed by Dr. [name] and his team.  Medications reviewed.  Follow up 1 month post TEE.  Medication Adjustments/Labs and Tests Ordered: Current medicines are reviewed at length with the patient today.  Concerns regarding medicines are outlined above.  Orders Placed This Encounter  Procedures  . CBC  . Basic metabolic panel  . EKG 12-Lead   No orders of the defined types were placed in this encounter.   Patient Instructions  Medication Instructions:  The current medical regimen is effective;  continue present plan and medications.  *If you need a refill on your cardiac medications before your next appointment, please call your pharmacy*  Lab Work: Please have blood work today  (CBC, BMP)  If you have labs (blood work) drawn today and your tests are completely normal, you will receive your results only by: [name] MyChart Message (if you have MyChart) OR . A paper copy in the mail If you have any lab test that is abnormal or we need to change your treatment, we will call you to review the results.  Due to recent COVID-19 restrictions implemented by our local and state authorities and in an effort to keep both patients and staff as safe as possible, our hospital system requires COVID-19 testing prior to certain scheduled hospital procedures.  Please go to [address] [place], [place] [id number] on 12/22/2019 at  11:30 am  .  This is a drive up testing site.  You will not need to exit your vehicle.  You will not be billed at the time of testing but may receive a bill later depending on your insurance.  The approximate cost of the test is $100.  You must agree to  self-quarantine from the time of your testing until the procedure date on 12/24/2019.  This should included staying home with ONLY the people you live with.  Avoid take-out, grocery store shopping or leaving the house for any non-emergent reason.  Failure to have your COVID-19 test done on the date and time you have been scheduled will result in cancellation of your procedure.  Please call our office at [phone] if you have any questions.  Testing/Procedures: Your physician has requested that you have a TEE. During a TEE, sound waves are used to create images of your heart. It provides your doctor with information about the size and shape of your heart and how well your heart's chambers and valves are working. In this test, a transducer is attached to the end of a flexible tube that's guided down your throat and into your esophagus (the tube leading from you mouth to your stomach) to get a more detailed image of your heart. You are not awake for the procedure. Please see the instruction sheet given to you today.   Follow-Up: At [facility], you and your health needs are our priority.  As part of our continuing mission to provide you with exceptional heart care, we have created designated Provider Care Teams.  These Care Teams include your primary Cardiologist (physician) and Advanced Practice Providers (APPs -  Physician Assistants and Nurse Practitioners) who all work together to provide you with the care you need, when you need it.  We recommend signing up for the patient portal called "MyChart".  Sign up  information is provided on this After Visit Summary.  MyChart is used to connect with patients for Virtual Visits (Telemedicine).  Patients are able to view lab/test results, encounter notes, upcoming appointments, etc.  Non-urgent messages can be sent to your provider as well.   To learn more about what you can do with MyChart, go to [email or web address].    Your next appointment:   1  month(s)  The format for your next appointment:   In Person  Provider:   [name], MD   Thank you for choosing [facility]!!        Signed, [name], MD  12/08/2019 10:39 AM    [place]

## 2019-12-11 DIAGNOSIS — E1151 Type 2 diabetes mellitus with diabetic peripheral angiopathy without gangrene: Secondary | ICD-10-CM | POA: Diagnosis not present

## 2019-12-11 DIAGNOSIS — L89891 Pressure ulcer of other site, stage 1: Secondary | ICD-10-CM | POA: Diagnosis not present

## 2019-12-17 ENCOUNTER — Ambulatory Visit: Payer: PPO | Admitting: Internal Medicine

## 2019-12-17 ENCOUNTER — Other Ambulatory Visit: Payer: PPO

## 2019-12-17 ENCOUNTER — Ambulatory Visit (INDEPENDENT_AMBULATORY_CARE_PROVIDER_SITE_OTHER): Payer: PPO | Admitting: Internal Medicine

## 2019-12-17 ENCOUNTER — Other Ambulatory Visit: Payer: Self-pay

## 2019-12-17 ENCOUNTER — Encounter: Payer: Self-pay | Admitting: Internal Medicine

## 2019-12-17 ENCOUNTER — Ambulatory Visit: Payer: PPO

## 2019-12-17 VITALS — BP 160/80 | HR 57 | Temp 98.2°F | Resp 16 | Ht 65.0 in | Wt 167.0 lb

## 2019-12-17 DIAGNOSIS — G63 Polyneuropathy in diseases classified elsewhere: Secondary | ICD-10-CM | POA: Diagnosis not present

## 2019-12-17 DIAGNOSIS — E119 Type 2 diabetes mellitus without complications: Secondary | ICD-10-CM | POA: Insufficient documentation

## 2019-12-17 DIAGNOSIS — IMO0002 Reserved for concepts with insufficient information to code with codable children: Secondary | ICD-10-CM

## 2019-12-17 DIAGNOSIS — E1159 Type 2 diabetes mellitus with other circulatory complications: Secondary | ICD-10-CM

## 2019-12-17 DIAGNOSIS — Z794 Long term (current) use of insulin: Secondary | ICD-10-CM | POA: Diagnosis not present

## 2019-12-17 DIAGNOSIS — E538 Deficiency of other specified B group vitamins: Secondary | ICD-10-CM | POA: Diagnosis not present

## 2019-12-17 DIAGNOSIS — E042 Nontoxic multinodular goiter: Secondary | ICD-10-CM | POA: Diagnosis not present

## 2019-12-17 DIAGNOSIS — I10 Essential (primary) hypertension: Secondary | ICD-10-CM | POA: Diagnosis not present

## 2019-12-17 LAB — POCT GLYCOSYLATED HEMOGLOBIN (HGB A1C): Hemoglobin A1C: 7.3 % — AB (ref 4.0–5.6)

## 2019-12-17 MED ORDER — OZEMPIC (1 MG/DOSE) 4 MG/3ML ~~LOC~~ SOPN: 1.0000 mg | PEN_INJECTOR | SUBCUTANEOUS | 1 refills | Status: DC

## 2019-12-17 MED ORDER — CYANOCOBALAMIN 1000 MCG/ML IJ SOLN
1000.0000 ug | Freq: Once | INTRAMUSCULAR | Status: AC
  Administered 2019-12-17: 1000 ug via INTRAMUSCULAR

## 2019-12-17 NOTE — Patient Instructions (Signed)

## 2019-12-17 NOTE — Progress Notes (Signed)
Subjective:  Patient ID: Theresa Harrington, female    DOB: 1956/01/19  Age: 64 y.o. MRN: [id number]  CC: Hypertension and Diabetes  This visit occurred during the SARS-CoV-2 public health emergency.  Safety protocols were in place, including screening questions prior to the visit, additional usage of staff PPE, and extensive cleaning of exam room while observing appropriate contact time as indicated for disinfecting solutions.    HPI Theresa Harrington presents for f/up - She is in her usual state of health today.  She offers no complaints.  She thinks she is compliant with all of her antihypertensives.  She has not recently been monitoring her blood pressure.  She tells me her blood sugars have been pretty well controlled.  She denies polys.  She denies headache, blurred vision, chest pain, shortness of breath, palpitations, or edema.  Outpatient Medications Prior to Visit  Medication Sig Dispense Refill  . AMBULATORY NON FORMULARY MEDICATION 62m-Viscous lidocaine, 94m10mg/5ml Diyclomine, 2701malox Swish and Swallow 57m25m mouth four times daily. (Patient not taking: Reported on 12/17/2019) 550 mL 1  . Blood Glucose Monitoring Suppl (ONETOUCH VERIO IQ SYSTEM) w/Device KIT USE TO CHECK SUGAR DAILY 1 kit 0  . dorzolamide-timolol (COSOPT) 22.3-6.8 MG/ML ophthalmic solution Place 1 drop into the right eye 2 (two) times daily.     . ezMarland Kitchentimibe (ZETIA) 10 MG tablet Take 1 tablet (10 mg total) by mouth at bedtime. 90 tablet 1  . glucose blood test strip Use to check blood sugar twice daily. DX: E11.8 200 each 3  . Insulin Glargine (BASAGLAR KWIKPEN) 100 UNIT/ML Inject 0.3 mLs (30 Units total) into the skin daily. 15 mL 1  . insulin lispro (HUMALOG) 100 UNIT/ML injection Inject 14-20 Units into the skin 3 (three) times daily before meals.     . Insulin Pen Needle (PEN NEEDLES) 32G X 4 MM MISC Inject 1 pen as directed 4 (four) times daily. Use to inject levemir or novolog as directed. 400 each 3  . latanoprost  (XALATAN) 0.005 % ophthalmic solution Place 1 drop into both eyes at bedtime.     . loMarland Kitchenartan-hydrochlorothiazide (HYZAAR) 100-12.5 MG tablet Take 1 tablet by mouth daily. 90 tablet 1  . magnesium oxide (MAG-OX) 400 MG tablet Take 1 tablet (400 mg total) by mouth at bedtime. 90 tablet 1  . nebivolol (BYSTOLIC) 5 MG tablet Take 1 tablet (5 mg total) by mouth daily. 90 tablet 1  . omeprazole (PRILOSEC) 40 MG capsule Take 1 capsule (40 mg total) by mouth 2 (two) times daily before a meal. 180 capsule 1  . potassium chloride SA (KLOR-CON) 20 MEQ tablet Take 1 tablet (20 mEq total) by mouth daily. 90 tablet 1  . pravastatin (PRAVACHOL) 40 MG tablet Take 1 tablet (40 mg total) by mouth daily. 90 tablet 1  . rivaroxaban (XARELTO) 20 MG TABS tablet Take 1 tablet (20 mg total) by mouth daily. 90 tablet 1  . thiamine 100 MG tablet Take 1 tablet (100 mg total) by mouth every other day. 45 tablet 1  . Empagliflozin-metFORMIN HCl ER (SYNJARDY XR) 12.08-998 MG TB24 Take 1 tablet by mouth daily. 140 tablet 0  . Semaglutide,0.25 or 0.5MG/DOS, (OZEMPIC, 0.25 OR 0.5 MG/DOSE,) 2 MG/1.5ML SOPN Inject 0.5 mg into the skin once a week.    . VIMarland KitchenTOZA 18 MG/3ML SOPN INJECT 1.8 MG INTO THE SKIN DAILY 9 mL 0   No facility-administered medications prior to visit.    ROS Review of Systems  Constitutional: Negative for appetite  change, diaphoresis, fatigue and unexpected weight change.  HENT: Negative.   Eyes: Negative for visual disturbance.  Respiratory: Negative for chest tightness, shortness of breath and wheezing.   Cardiovascular: Negative for chest pain, palpitations and leg swelling.  Gastrointestinal: Negative for abdominal pain, constipation, diarrhea, nausea and vomiting.  Endocrine: Negative.  Negative for cold intolerance, heat intolerance, polydipsia, polyphagia and polyuria.  Genitourinary: Negative for difficulty urinating and dysuria.  Musculoskeletal: Negative for arthralgias and myalgias.  Skin:  Negative.  Negative for color change, pallor and rash.  Neurological: Negative.  Negative for dizziness, weakness and light-headedness.  Hematological: Negative for adenopathy. Does not bruise/bleed easily.  Psychiatric/Behavioral: Negative.     Objective:  BP (!) 160/80 (BP Location: Left Arm, Patient Position: Sitting, Cuff Size: Normal)   Pulse (!) 57   Temp 98.2 F (36.8 C) (Oral)   Resp 16   Ht '5\' 5"'  (1.651 m)   Wt 167 lb (75.8 kg)   LMP  (LMP Unknown)   SpO2 96%   BMI 27.79 kg/m   BP Readings from Last 3 Encounters:  12/17/19 (!) 160/80  12/08/19 126/62  10/09/19 100/60    Wt Readings from Last 3 Encounters:  12/17/19 167 lb (75.8 kg)  12/08/19 171 lb (77.6 kg)  10/09/19 171 lb (77.6 kg)    Physical Exam Vitals reviewed.  HENT:     Nose: Nose normal.     Mouth/Throat:     Mouth: Mucous membranes are moist.  Eyes:     General: No scleral icterus.    Conjunctiva/sclera: Conjunctivae normal.  Cardiovascular:     Rate and Rhythm: Normal rate and regular rhythm.     Heart sounds: No murmur heard.   Pulmonary:     Effort: Pulmonary effort is normal.     Breath sounds: No stridor. No wheezing, rhonchi or rales.  Abdominal:     General: Abdomen is protuberant. Bowel sounds are normal. There is no distension.     Palpations: Abdomen is soft. There is no hepatomegaly, splenomegaly or mass.     Tenderness: There is no abdominal tenderness.  Musculoskeletal:        General: Normal range of motion.     Cervical back: Neck supple.     Right lower leg: No edema.     Left lower leg: No edema.  Lymphadenopathy:     Cervical: No cervical adenopathy.  Skin:    General: Skin is warm and dry.  Neurological:     General: No focal deficit present.     Mental Status: She is alert.  Psychiatric:        Behavior: Behavior normal.     Lab Results  Component Value Date   WBC 9.8 12/08/2019   HGB 13.9 12/08/2019   HCT 41.9 12/08/2019   PLT 247 12/08/2019   GLUCOSE  95 12/08/2019   CHOL 134 06/04/2019   TRIG 114.0 06/04/2019   HDL 47.90 06/04/2019   LDLDIRECT 160.2 03/01/2010   LDLCALC 64 06/04/2019   ALT 20 10/08/2018   AST 18 10/08/2018   NA 138 12/08/2019   K 3.5 12/08/2019   CL 99 12/08/2019   CREATININE 0.88 12/08/2019   BUN 17 12/08/2019   CO2 25 12/08/2019   TSH 0.42 06/04/2019   INR 1.05 07/16/2014   HGBA1C 7.3 (A) 12/17/2019   MICROALBUR 42.3 (H) 02/26/2019    No results found.  Assessment & Plan:   Theresa Harrington was seen today for hypertension and diabetes.  Diagnoses and all  orders for this visit:  Essential hypertension, benign- Her blood pressure is not adequately well controlled.  I will monitor for proteinuria.  She will be more compliant with her antihypertensives and will continue to work on her lifestyle modifications.  She agrees to return in 2 to 3 months for a blood pressure recheck. -     Urinalysis, Routine w reflex microscopic; Future  Type 2 diabetes mellitus with other circulatory complication, with long-term current use of insulin ([place])- Her A1c is up to 7.3%.  I recommended that she increase the dose of the GLP-1 agonist and to continue the other glycemic agents. -     Cancel: Hemoglobin A1c; Future -     Microalbumin / creatinine urine ratio; Future -     POCT glycosylated hemoglobin (Hb A1C) -     Semaglutide, 1 MG/DOSE, (OZEMPIC, 1 MG/DOSE,) 4 MG/3ML SOPN; Inject 0.75 mLs (1 mg total) into the skin once a week. (Patient taking differently: Inject 1 mg into the skin every Friday. )  GOITER, MULTINODULAR- She appears euthyroid.  I will monitor her TSH level. -     TSH; Future  Vitamin B12 deficiency neuropathy (HCC) -     cyanocobalamin ((VITAMIN B-12)) injection 1,000 mcg   I have discontinued Deem Hymes's Victoza and Ozempic (0.25 or 0.5 MG/DOSE). I am also having her start on Ozempic (1 MG/DOSE). Additionally, I am having her maintain her Pen Needles, latanoprost, OneTouch Verio IQ System, dorzolamide-timolol,  thiamine, insulin lispro, AMBULATORY NON FORMULARY MEDICATION, Basaglar KwikPen, ezetimibe, losartan-hydrochlorothiazide, magnesium oxide, potassium chloride SA, pravastatin, rivaroxaban, omeprazole, nebivolol, glucose blood, and Synjardy XR. We administered cyanocobalamin.  Meds ordered this encounter  Medications  . cyanocobalamin ((VITAMIN B-12)) injection 1,000 mcg  . Semaglutide, 1 MG/DOSE, (OZEMPIC, 1 MG/DOSE,) 4 MG/3ML SOPN    Sig: Inject 0.75 mLs (1 mg total) into the skin once a week.    Dispense:  9 mL    Refill:  1  . Empagliflozin-metFORMIN HCl ER (SYNJARDY XR) 12.08-998 MG TB24    Sig: Take 1 tablet by mouth daily.    Dispense:  90 tablet    Refill:  1     Follow-up: Return in about 3 months (around 03/18/2020).  Scarlette Calico, MD

## 2019-12-20 MED ORDER — SYNJARDY XR 12.5-1000 MG PO TB24: 1.0000 | ORAL_TABLET | Freq: Every day | ORAL | 1 refills | Status: DC

## 2019-12-22 ENCOUNTER — Other Ambulatory Visit (HOSPITAL_COMMUNITY)
Admission: RE | Admit: 2019-12-22 | Discharge: 2019-12-22 | Disposition: A | Discharge status: Home / Self Care | Payer: PPO | Source: Ambulatory Visit | Attending: Cardiology | Admitting: Cardiology

## 2019-12-22 DIAGNOSIS — Z20822 Contact with and (suspected) exposure to covid-19: Secondary | ICD-10-CM | POA: Insufficient documentation

## 2019-12-22 DIAGNOSIS — Z01812 Encounter for preprocedural laboratory examination: Secondary | ICD-10-CM | POA: Insufficient documentation

## 2019-12-22 LAB — SARS CORONAVIRUS 2 (TAT 6-24 HRS): SARS Coronavirus 2: NEGATIVE

## 2019-12-23 ENCOUNTER — Telehealth: Payer: Self-pay | Admitting: Pharmacist

## 2019-12-23 NOTE — Progress Notes (Signed)
Chronic Care Management Pharmacy Assistant   Name: Theresa Harrington  MRN: [id number] DOB: June 19, 1955  Reason for Encounter: Medication Review  Patient Questions:  1.  Have you seen any other providers since your last visit? Yes, patient had an appointment with Dr. [name] on, 12/17/2019  2.  Any changes in your medicines or health? No   .  PCP : [name], MD  Allergies:   Allergies  Allergen Reactions  . Amlodipine Swelling  . Atorvastatin Other (See Comments)    Muscle aches   . Ampicillin Nausea And Vomiting and Other (See Comments)  . Codeine Nausea And Vomiting    Ask patient  . Lisinopril Cough       . Penicillins Nausea And Vomiting    Did it involve swelling of the face/tongue/throat, SOB, or low BP? No Did it involve sudden or severe rash/hives, skin peeling, or any reaction on the inside of your mouth or nose? No Did you need to seek medical attention at a hospital or doctor's office? No When did it last happen?20+ years If all above answers are "NO", may proceed with cephalosporin use.   [name] [Sitagliptin] Nausea And Vomiting    Medications: Outpatient Encounter Medications as of 12/23/2019  Medication Sig  . AMBULATORY NON FORMULARY MEDICATION 53m-Viscous lidocaine, 974m10mg/5ml Diyclomine, 27017malox Swish and Swallow 10m74m mouth four times daily. (Patient not taking: Reported on 12/17/2019)  . Blood Glucose Monitoring Suppl (ONETOUCH VERIO IQ SYSTEM) w/Device KIT USE TO CHECK SUGAR DAILY  . cetirizine (ZYRTEC) 10 MG tablet Take 10 mg by mouth daily as needed for allergies.  . Cyanocobalamin (B-12 COMPLIANCE INJECTION IJ) Inject 1,000 mcg as directed every 30 (thirty) days.  . diMarland Kitchentiazem (CARTIA XT) 180 MG 24 hr capsule Take 180 mg by mouth daily.  . dorzolamide-timolol (COSOPT) 22.3-6.8 MG/ML ophthalmic solution Place 1 drop into the right eye 2 (two) times daily.   . Empagliflozin-metFORMIN HCl ER (SYNJARDY XR) 12.08-998 MG TB24 Take 1  tablet by mouth daily.  . ezMarland Kitchentimibe (ZETIA) 10 MG tablet Take 1 tablet (10 mg total) by mouth at bedtime.  . glMarland Kitchencose blood test strip Use to check blood sugar twice daily. DX: E11.8  . Insulin Glargine (BASAGLAR KWIKPEN) 100 UNIT/ML Inject 0.3 mLs (30 Units total) into the skin daily.  . insulin lispro (HUMALOG) 100 UNIT/ML injection Inject 14-20 Units into the skin 3 (three) times daily before meals.   . Insulin Pen Needle (PEN NEEDLES) 32G X 4 MM MISC Inject 1 pen as directed 4 (four) times daily. Use to inject levemir or novolog as directed.  . latanoprost (XALATAN) 0.005 % ophthalmic solution Place 1 drop into both eyes at bedtime.   . loMarland Kitchenartan-hydrochlorothiazide (HYZAAR) 100-12.5 MG tablet Take 1 tablet by mouth daily.  . magnesium oxide (MAG-OX) 400 MG tablet Take 1 tablet (400 mg total) by mouth at bedtime.  . nebivolol (BYSTOLIC) 5 MG tablet Take 1 tablet (5 mg total) by mouth daily.  . omMarland Kitchenprazole (PRILOSEC) 40 MG capsule Take 1 capsule (40 mg total) by mouth 2 (two) times daily before a meal.  . potassium chloride SA (KLOR-CON) 20 MEQ tablet Take 1 tablet (20 mEq total) by mouth daily.  . pravastatin (PRAVACHOL) 40 MG tablet Take 1 tablet (40 mg total) by mouth daily.  . rivaroxaban (XARELTO) 20 MG TABS tablet Take 1 tablet (20 mg total) by mouth daily.  . Semaglutide, 1 MG/DOSE, (OZEMPIC, 1 MG/DOSE,) 4 MG/3ML SOPN Inject 0.75 mLs (1  mg total) into the skin once a week. (Patient taking differently: Inject 1 mg into the skin every Friday. )  . thiamine 100 MG tablet Take 1 tablet (100 mg total) by mouth every other day.   No facility-administered encounter medications on file as of 12/23/2019.    Current Diagnosis: Patient Active Problem List   Diagnosis Date Noted  . Diabetes mellitus ([place]) 12/17/2019  . Atrial fibrillation ([place])   . UTI due to extended-spectrum beta lactamase (ESBL) producing Escherichia coli 01/11/2019  . Gastroesophageal reflux disease with esophagitis  12/10/2018  . Barrett's esophagus with high grade dysplasia 11/06/2018  . Current use of long term anticoagulation 11/06/2018  . Diuretic-induced hypokalemia 10/08/2018  . Midline low back pain without sciatica 08/14/2018  . Thiamine deficiency 11/05/2017  . Atrial fibrillation with RVR ([place]) 11/08/2016  . Chronic idiopathic constipation 06/05/2016  . Morbid obesity due to excess calories ([place]) 08/04/2015  . Routine general medical examination at a health care facility 01/28/2015  . Visit for screening mammogram 01/27/2015  . Screening for cervical cancer 01/27/2015  . Kidney stone on right side 01/27/2015  . Atherosclerosis of native arteries of the extremities with ulceration ([place]) 11/14/2013  . Anemia, iron deficiency 10/28/2013  . Vitamin B12 deficiency neuropathy ([place]) 10/28/2013  . 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 10/09/2013  . GERD (gastroesophageal reflux disease) 08/06/2012  . Allergic rhinitis, cause unspecified 07/04/2012  . Low back pain radiating to both legs 07/04/2012  . GOITER, MULTINODULAR 11/04/2008  . Uncontrolled type 2 diabetes mellitus with peripheral artery disease ([place]) 08/05/2008  . Hyperlipidemia with target LDL less than 100 08/05/2008  . Essential hypertension, benign 08/05/2008    Goals Addressed   None     Follow-Up:  Coordination of Enhanced Pharmacy Services   Reviewed chart for medication changes ahead of medication coordination call.  Patient had an office visit with Dr.Jones on, 12/17/2019 to discuss hypertension and diabetes.  No medication changes indicated.  BP Readings from Last 3 Encounters:  12/17/19 (!) 160/80  12/08/19 126/62  10/09/19 100/60    Lab Results  Component Value Date   HGBA1C 7.3 (A) 12/17/2019     Patient obtains medications through Vials  90 Days   Last adherence delivery included:   Magnesium Oxide 400 mg  Ezetimibe 10 mg Pravastatin 40 mg Losartan-Hydrochlorothiazide  100-12.5 mg  Omeprazole 40 mg  Potassium CL ER 63mq  Vitamin B1 1059m   Patient is due for next adherence delivery on: 12/25/2019. Called patient and reviewed medications and coordinated delivery.  This delivery to include: Ozempic 1 mg/ 61m4VO/5FYystolic 29m67m  Confirmed delivery date of 12/25/2019, advised patient that pharmacy will contact them the morning of delivery.   DevRosendo GrosMAHillsboroarmacist Assistant  [phone]

## 2019-12-24 ENCOUNTER — Encounter (HOSPITAL_COMMUNITY): Payer: Self-pay | Admitting: Cardiology

## 2019-12-24 ENCOUNTER — Ambulatory Visit (HOSPITAL_COMMUNITY): Payer: PPO | Admitting: Anesthesiology

## 2019-12-24 ENCOUNTER — Ambulatory Visit (HOSPITAL_BASED_OUTPATIENT_CLINIC_OR_DEPARTMENT_OTHER): Payer: PPO

## 2019-12-24 ENCOUNTER — Ambulatory Visit (HOSPITAL_COMMUNITY)
Admission: RE | Admit: 2019-12-24 | Discharge: 2019-12-24 | Disposition: A | Discharge status: Home / Self Care | Payer: PPO | Attending: Cardiology | Admitting: Cardiology

## 2019-12-24 ENCOUNTER — Encounter (HOSPITAL_COMMUNITY)
Admission: RE | Disposition: A | Discharge status: Home / Self Care | Payer: PPO | Source: Home / Self Care | Attending: Cardiology

## 2019-12-24 ENCOUNTER — Other Ambulatory Visit: Payer: Self-pay

## 2019-12-24 DIAGNOSIS — Z87891 Personal history of nicotine dependence: Secondary | ICD-10-CM | POA: Diagnosis not present

## 2019-12-24 DIAGNOSIS — E538 Deficiency of other specified B group vitamins: Secondary | ICD-10-CM | POA: Diagnosis not present

## 2019-12-24 DIAGNOSIS — I082 Rheumatic disorders of both aortic and tricuspid valves: Secondary | ICD-10-CM | POA: Diagnosis not present

## 2019-12-24 DIAGNOSIS — E1151 Type 2 diabetes mellitus with diabetic peripheral angiopathy without gangrene: Secondary | ICD-10-CM | POA: Diagnosis not present

## 2019-12-24 DIAGNOSIS — Z79899 Other long term (current) drug therapy: Secondary | ICD-10-CM | POA: Insufficient documentation

## 2019-12-24 DIAGNOSIS — I10 Essential (primary) hypertension: Secondary | ICD-10-CM | POA: Insufficient documentation

## 2019-12-24 DIAGNOSIS — Z794 Long term (current) use of insulin: Secondary | ICD-10-CM | POA: Insufficient documentation

## 2019-12-24 DIAGNOSIS — D6869 Other thrombophilia: Secondary | ICD-10-CM | POA: Diagnosis not present

## 2019-12-24 DIAGNOSIS — I050 Rheumatic mitral stenosis: Secondary | ICD-10-CM

## 2019-12-24 DIAGNOSIS — I4819 Other persistent atrial fibrillation: Secondary | ICD-10-CM | POA: Insufficient documentation

## 2019-12-24 DIAGNOSIS — E785 Hyperlipidemia, unspecified: Secondary | ICD-10-CM | POA: Insufficient documentation

## 2019-12-24 DIAGNOSIS — Z7901 Long term (current) use of anticoagulants: Secondary | ICD-10-CM | POA: Insufficient documentation

## 2019-12-24 DIAGNOSIS — I342 Nonrheumatic mitral (valve) stenosis: Secondary | ICD-10-CM

## 2019-12-24 DIAGNOSIS — K219 Gastro-esophageal reflux disease without esophagitis: Secondary | ICD-10-CM | POA: Insufficient documentation

## 2019-12-24 DIAGNOSIS — D509 Iron deficiency anemia, unspecified: Secondary | ICD-10-CM | POA: Insufficient documentation

## 2019-12-24 DIAGNOSIS — I700 Atherosclerosis of aorta: Secondary | ICD-10-CM | POA: Insufficient documentation

## 2019-12-24 DIAGNOSIS — E039 Hypothyroidism, unspecified: Secondary | ICD-10-CM | POA: Diagnosis not present

## 2019-12-24 HISTORY — PX: TEE WITHOUT CARDIOVERSION: SHX5443

## 2019-12-24 LAB — GLUCOSE, CAPILLARY
Glucose-Capillary: 70 mg/dL (ref 70–99)
Glucose-Capillary: 90 mg/dL (ref 70–99)

## 2019-12-24 SURGERY — ECHOCARDIOGRAM, TRANSESOPHAGEAL
Anesthesia: Monitor Anesthesia Care

## 2019-12-24 MED ORDER — SODIUM CHLORIDE 0.9 % IV SOLN: INTRAVENOUS | Status: DC | PRN

## 2019-12-24 MED ORDER — DEXTROSE 50 % IV SOLN
INTRAVENOUS | Status: AC
  Filled 2019-12-24: qty 50

## 2019-12-24 MED ORDER — PROPOFOL 10 MG/ML IV BOLUS
INTRAVENOUS | Status: DC | PRN
  Administered 2019-12-24: 60 mg via INTRAVENOUS

## 2019-12-24 MED ORDER — BUTAMBEN-TETRACAINE-BENZOCAINE 2-2-14 % EX AERO
INHALATION_SPRAY | CUTANEOUS | Status: DC | PRN
  Administered 2019-12-24: 2 via TOPICAL

## 2019-12-24 MED ORDER — PROPOFOL 500 MG/50ML IV EMUL
INTRAVENOUS | Status: DC | PRN
  Administered 2019-12-24: 75 ug/kg/min via INTRAVENOUS

## 2019-12-24 MED ORDER — DEXTROSE 50 % IV SOLN
25.0000 mL | Freq: Once | INTRAVENOUS | Status: AC
  Administered 2019-12-24: 25 mL via INTRAVENOUS

## 2019-12-24 MED ORDER — SODIUM CHLORIDE 0.9 % IV SOLN: INTRAVENOUS | Status: DC

## 2019-12-24 MED ORDER — LACTATED RINGERS IV SOLN: INTRAVENOUS | Status: DC | PRN

## 2019-12-24 MED ORDER — LIDOCAINE 2% (20 MG/ML) 5 ML SYRINGE
INTRAMUSCULAR | Status: DC | PRN
  Administered 2019-12-24: 60 mg via INTRAVENOUS

## 2019-12-24 NOTE — CV Procedure (Addendum)
    PROCEDURE NOTE:  Procedure:  Transesophageal echocardiogram Operator:  Fransico Him, MD Indications:  Mitral Stenosis Complications: None  During this procedure the patient is administered a total of Propofol 140 mg and Lidocaine 60mg  to achieve and maintain moderate conscious sedation.  The patient's heart rate, blood pressure, and oxygen saturation are monitored continuously during the procedure by anesthesia.   Results: Normal LV size and function with EF 60-65% Mildly dilated RV size and normal RV function Normal RA Moderately dilated LA with significant spontaneous echo contrast.  Normal LA appendage with no thrombus. Normal TV with trivial TR Normal PV with trivial PR Severe mitral annular calcification.  There is severe thickening and calcification of the posterior mitral valve leaflet which is immobile.  The anterior MV leaflet is normal with normal leaflet excursion.  Very mild mitral stenosis.  The mean MVG was 2-31mmHg and MVA 2.0cm2.  Trileaflet AV with mild to moderate thickening of AV leaflets.  Trivial AI.  Lipomatous hypertrophy of the interatrial septum with no evidence of shunt by colorflow dopper  Normal thoracic and ascending aorta. 3D imaging of the mitral valve was obtained.  The patient tolerated the procedure well and was transferred back to their room in stable condition.  Signed: Fransico Him, MD [facility]

## 2019-12-24 NOTE — Progress Notes (Signed)
Reviewed chart and determined patient is actually getting Ozempic via [employer] patient assistance. I will submit reorder form to [facility] to receive patients refill. The medication will be delivered to the office within the next 1-2 weeks.  Also, patient previously used samples of Bystolic due to high cost through insurance. I have prepared Bystolic Patient assistance application and patient needs to sign the patient section in order to submit.   I spoke with patient regarding the above. She voiced understanding that the below delivery will be cancelled, Ozempic will be requested from [facility], and she will come to office to sign Bystolic application at her earliest convenience.

## 2019-12-24 NOTE — Transfer of Care (Signed)
Immediate Anesthesia Transfer of Care Note  Patient: Layla Fine  Procedure(s) Performed: TRANSESOPHAGEAL ECHOCARDIOGRAM (TEE) (N/A )  Patient Location: Endoscopy Unit  Anesthesia Type:MAC  Level of Consciousness: awake and patient cooperative  Airway & Oxygen Therapy: Patient Spontanous Breathing and Patient connected to nasal cannula oxygen  Post-op Assessment: Report given to RN and Post -op Vital signs reviewed and stable  Post vital signs: Reviewed and stable  Last Vitals:  Vitals Value Taken Time  BP 144/63 12/24/19 1027  Temp    Pulse 53 12/24/19 1028  Resp 13 12/24/19 1028  SpO2 100 % 12/24/19 1028  Vitals shown include unvalidated device data.  Last Pain:  Vitals:   12/24/19 0857  TempSrc: Oral  PainSc: 2          Complications: No complications documented.

## 2019-12-24 NOTE — Discharge Instructions (Signed)

## 2019-12-24 NOTE — Progress Notes (Signed)
  Echocardiogram Echocardiogram Transesophageal has been performed.  Theresa Harrington 12/24/2019, 10:38 AM

## 2019-12-24 NOTE — Anesthesia Postprocedure Evaluation (Signed)
Anesthesia Post Note  Patient: Theresa Harrington  Procedure(s) Performed: TRANSESOPHAGEAL ECHOCARDIOGRAM (TEE) (N/A )     Patient location during evaluation: Endoscopy Anesthesia Type: MAC Level of consciousness: awake and alert Pain management: pain level controlled Vital Signs Assessment: post-procedure vital signs reviewed and stable Respiratory status: spontaneous breathing, nonlabored ventilation, respiratory function stable and patient connected to nasal cannula oxygen Cardiovascular status: stable and blood pressure returned to baseline Postop Assessment: no apparent nausea or vomiting Anesthetic complications: no   No complications documented.  Last Vitals:  Vitals:   12/24/19 1040 12/24/19 1050  BP: (!) 162/65 (!) 179/61  Pulse: (!) 52 (!) 51  Resp: 14 10  Temp:    SpO2: 100% 100%    Last Pain:  Vitals:   12/24/19 1050  TempSrc:   PainSc: 0-No pain                 Theresa Harrington

## 2019-12-24 NOTE — Interval H&P Note (Signed)
History and Physical Interval Note:  12/24/2019 9:26 AM  Theresa Harrington  has presented today for surgery, with the diagnosis of [place].  The various methods of treatment have been discussed with the patient and family. After consideration of risks, benefits and other options for treatment, the patient has consented to  Procedure(s): TRANSESOPHAGEAL ECHOCARDIOGRAM (TEE) (N/A) as a surgical intervention.  The patient's history has been reviewed, patient examined, no change in status, stable for surgery.  I have reviewed the patient's chart and labs.  Questions were answered to the patient's satisfaction.     Fransico Him

## 2019-12-24 NOTE — Anesthesia Procedure Notes (Signed)
Procedure Name: MAC Date/Time: 12/24/2019 10:10 AM Performed by: Orlie Dakin, CRNA Pre-anesthesia Checklist: Patient identified, Emergency Drugs available, Patient being monitored and Suction available Patient Re-evaluated:Patient Re-evaluated prior to induction Oxygen Delivery Method: Nasal cannula Preoxygenation: Pre-oxygenation with 100% oxygen Induction Type: IV induction Placement Confirmation: positive ETCO2

## 2019-12-24 NOTE — Anesthesia Preprocedure Evaluation (Signed)
Anesthesia Evaluation  Patient identified by MRN, date of birth, ID band Patient awake    Reviewed: Allergy & Precautions, NPO status , Patient's Chart, lab work & pertinent test results  Airway Mallampati: II  TM Distance: >3 FB Neck ROM: Full    Dental  (+) Teeth Intact   Pulmonary former smoker,    breath sounds clear to auscultation       Cardiovascular hypertension,  Rhythm:Regular Rate:Normal     Neuro/Psych    GI/Hepatic   Endo/Other  diabetes  Renal/GU      Musculoskeletal   Abdominal   Peds  Hematology   Anesthesia Other Findings   Reproductive/Obstetrics                             Anesthesia Physical Anesthesia Plan  ASA: III  Anesthesia Plan: MAC   Post-op Pain Management:    Induction: Intravenous  PONV Risk Score and Plan:   Airway Management Planned: Natural Airway and Nasal Cannula  Additional Equipment:   Intra-op Plan:   Post-operative Plan:   Informed Consent: I have reviewed the patients History and Physical, chart, labs and discussed the procedure including the risks, benefits and alternatives for the proposed anesthesia with the patient or authorized representative who has indicated his/her understanding and acceptance.     Dental advisory given  Plan Discussed with: CRNA and Anesthesiologist  Anesthesia Plan Comments:         Anesthesia Quick Evaluation

## 2019-12-26 LAB — ECHO TEE: Area-P 1/2: 1.91 cm2

## 2019-12-31 ENCOUNTER — Telehealth: Payer: PPO

## 2020-01-01 DIAGNOSIS — E1151 Type 2 diabetes mellitus with diabetic peripheral angiopathy without gangrene: Secondary | ICD-10-CM | POA: Diagnosis not present

## 2020-01-01 DIAGNOSIS — L89891 Pressure ulcer of other site, stage 1: Secondary | ICD-10-CM | POA: Diagnosis not present

## 2020-01-06 ENCOUNTER — Other Ambulatory Visit: Payer: Self-pay

## 2020-01-06 DIAGNOSIS — I70213 Atherosclerosis of native arteries of extremities with intermittent claudication, bilateral legs: Secondary | ICD-10-CM

## 2020-01-06 DIAGNOSIS — E1151 Type 2 diabetes mellitus with diabetic peripheral angiopathy without gangrene: Secondary | ICD-10-CM | POA: Insufficient documentation

## 2020-01-06 DIAGNOSIS — S98132A Complete traumatic amputation of one left lesser toe, initial encounter: Secondary | ICD-10-CM | POA: Insufficient documentation

## 2020-01-06 DIAGNOSIS — L680 Hirsutism: Secondary | ICD-10-CM | POA: Insufficient documentation

## 2020-01-07 ENCOUNTER — Other Ambulatory Visit: Payer: Self-pay

## 2020-01-07 ENCOUNTER — Ambulatory Visit (INDEPENDENT_AMBULATORY_CARE_PROVIDER_SITE_OTHER): Payer: PPO | Admitting: Internal Medicine

## 2020-01-07 ENCOUNTER — Encounter: Payer: Self-pay | Admitting: Internal Medicine

## 2020-01-07 VITALS — BP 136/64 | HR 53 | Temp 97.8°F | Resp 16 | Ht 65.0 in | Wt 174.0 lb

## 2020-01-07 DIAGNOSIS — S39012A Strain of muscle, fascia and tendon of lower back, initial encounter: Secondary | ICD-10-CM | POA: Insufficient documentation

## 2020-01-07 DIAGNOSIS — Z23 Encounter for immunization: Secondary | ICD-10-CM

## 2020-01-07 DIAGNOSIS — I10 Essential (primary) hypertension: Secondary | ICD-10-CM | POA: Diagnosis not present

## 2020-01-07 NOTE — Patient Instructions (Signed)
Acute Back Pain, Adult Acute back pain is sudden and usually short-lived. It is often caused by an injury to the muscles and tissues in the back. The injury may result from:  A muscle or ligament getting overstretched or torn (strained). Ligaments are tissues that connect bones to each other. Lifting something improperly can cause a back strain.  Wear and tear (degeneration) of the spinal disks. Spinal disks are circular tissue that provides cushioning between the bones of the spine (vertebrae).  Twisting motions, such as while playing sports or doing yard work.  A hit to the back.  Arthritis. You may have a physical exam, lab tests, and imaging tests to find the cause of your pain. Acute back pain usually goes away with rest and home care. Follow these instructions at home: Managing pain, stiffness, and swelling  Take over-the-counter and prescription medicines only as told by your health care provider.  Your health care provider may recommend applying ice during the first 24-48 hours after your pain starts. To do this: ? Put ice in a plastic bag. ? Place a towel between your skin and the bag. ? Leave the ice on for 20 minutes, 2-3 times a day.  If directed, apply heat to the affected area as often as told by your health care provider. Use the heat source that your health care provider recommends, such as a moist heat pack or a heating pad. ? Place a towel between your skin and the heat source. ? Leave the heat on for 20-30 minutes. ? Remove the heat if your skin turns bright red. This is especially important if you are unable to feel pain, heat, or cold. You have a greater risk of getting burned. Activity   Do not stay in bed. Staying in bed for more than 1-2 days can delay your recovery.  Sit up and stand up straight. Avoid leaning forward when you sit, or hunching over when you stand. ? If you work at a desk, sit close to it so you do not need to lean over. Keep your chin tucked  in. Keep your neck drawn back, and keep your elbows bent at a right angle. Your arms should look like the letter "L." ? Sit high and close to the steering wheel when you drive. Add lower back (lumbar) support to your car seat, if needed.  Take short walks on even surfaces as soon as you are able. Try to increase the length of time you walk each day.  Do not sit, drive, or stand in one place for more than 30 minutes at a time. Sitting or standing for long periods of time can put stress on your back.  Do not drive or use heavy machinery while taking prescription pain medicine.  Use proper lifting techniques. When you bend and lift, use positions that put less stress on your back: ? Bend your knees. ? Keep the load close to your body. ? Avoid twisting.  Exercise regularly as told by your health care provider. Exercising helps your back heal faster and helps prevent back injuries by keeping muscles strong and flexible.  Work with a physical therapist to make a safe exercise program, as recommended by your health care provider. Do any exercises as told by your physical therapist. Lifestyle  Maintain a healthy weight. Extra weight puts stress on your back and makes it difficult to have good posture.  Avoid activities or situations that make you feel anxious or stressed. Stress and anxiety increase muscle   tension and can make back pain worse. Learn ways to manage anxiety and stress, such as through exercise. General instructions  Sleep on a firm mattress in a comfortable position. Try lying on your side with your knees slightly bent. If you lie on your back, put a pillow under your knees.  Follow your treatment plan as told by your health care provider. This may include: ? Cognitive or behavioral therapy. ? Acupuncture or massage therapy. ? Meditation or yoga. Contact a health care provider if:  You have pain that is not relieved with rest or medicine.  You have increasing pain going down  into your legs or buttocks.  Your pain does not improve after 2 weeks.  You have pain at night.  You lose weight without trying.  You have a fever or chills. Get help right away if:  You develop new bowel or bladder control problems.  You have unusual weakness or numbness in your arms or legs.  You develop nausea or vomiting.  You develop abdominal pain.  You feel faint. Summary  Acute back pain is sudden and usually short-lived.  Use proper lifting techniques. When you bend and lift, use positions that put less stress on your back.  Take over-the-counter and prescription medicines and apply heat or ice as directed by your health care provider. This information is not intended to replace advice given to you by your health care provider. Make sure you discuss any questions you have with your health care provider. Document Revised: 08/06/2018 Document Reviewed: 11/29/2016 Elsevier Patient Education  2020 [employer]  
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
## 2020-01-08 ENCOUNTER — Ambulatory Visit: Payer: PPO | Admitting: Cardiology

## 2020-01-09 ENCOUNTER — Other Ambulatory Visit: Payer: Self-pay | Admitting: Internal Medicine

## 2020-01-09 ENCOUNTER — Telehealth: Payer: Self-pay | Admitting: Emergency Medicine

## 2020-01-09 DIAGNOSIS — S39012A Strain of muscle, fascia and tendon of lower back, initial encounter: Secondary | ICD-10-CM

## 2020-01-09 MED ORDER — NABUMETONE 500 MG PO TABS: 500.0000 mg | ORAL_TABLET | Freq: Two times a day (BID) | ORAL | 0 refills | Status: AC | PRN

## 2020-01-09 NOTE — Telephone Encounter (Signed)
Pt was seen on Wednesday. Dr [name] told her if she needed any pain medication to let him him. Pt is now requesting pain medication. Pharmacy is [facility] in [place]. Thanks

## 2020-01-17 ENCOUNTER — Other Ambulatory Visit: Payer: Self-pay | Admitting: Internal Medicine

## 2020-01-17 DIAGNOSIS — I7025 Atherosclerosis of native arteries of other extremities with ulceration: Secondary | ICD-10-CM

## 2020-01-17 DIAGNOSIS — IMO0002 Reserved for concepts with insufficient information to code with codable children: Secondary | ICD-10-CM

## 2020-01-17 DIAGNOSIS — E1151 Type 2 diabetes mellitus with diabetic peripheral angiopathy without gangrene: Secondary | ICD-10-CM

## 2020-01-17 DIAGNOSIS — I739 Peripheral vascular disease, unspecified: Secondary | ICD-10-CM

## 2020-01-17 DIAGNOSIS — I10 Essential (primary) hypertension: Secondary | ICD-10-CM

## 2020-01-19 ENCOUNTER — Ambulatory Visit: Payer: PPO | Admitting: Surgery

## 2020-01-19 ENCOUNTER — Encounter (HOSPITAL_COMMUNITY): Payer: PPO

## 2020-01-19 ENCOUNTER — Telehealth: Payer: Self-pay | Admitting: Pharmacist

## 2020-01-19 NOTE — Progress Notes (Signed)
A user error has taken place: encounter opened in error, closed for administrative reasons.
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
## 2020-01-21 ENCOUNTER — Ambulatory Visit: Payer: PPO | Admitting: Pharmacist

## 2020-01-21 ENCOUNTER — Other Ambulatory Visit: Payer: Self-pay

## 2020-01-21 ENCOUNTER — Encounter: Payer: Self-pay | Admitting: Internal Medicine

## 2020-01-21 ENCOUNTER — Telehealth: Payer: Self-pay

## 2020-01-21 ENCOUNTER — Ambulatory Visit (INDEPENDENT_AMBULATORY_CARE_PROVIDER_SITE_OTHER): Payer: PPO | Admitting: Internal Medicine

## 2020-01-21 VITALS — BP 102/62 | HR 61 | Temp 98.0°F | Wt 171.0 lb

## 2020-01-21 DIAGNOSIS — R399 Unspecified symptoms and signs involving the genitourinary system: Secondary | ICD-10-CM | POA: Diagnosis not present

## 2020-01-21 DIAGNOSIS — K22711 Barrett's esophagus with high grade dysplasia: Secondary | ICD-10-CM

## 2020-01-21 DIAGNOSIS — N3000 Acute cystitis without hematuria: Secondary | ICD-10-CM

## 2020-01-21 DIAGNOSIS — N390 Urinary tract infection, site not specified: Secondary | ICD-10-CM | POA: Insufficient documentation

## 2020-01-21 DIAGNOSIS — I4891 Unspecified atrial fibrillation: Secondary | ICD-10-CM

## 2020-01-21 DIAGNOSIS — I7025 Atherosclerosis of native arteries of other extremities with ulceration: Secondary | ICD-10-CM

## 2020-01-21 DIAGNOSIS — E785 Hyperlipidemia, unspecified: Secondary | ICD-10-CM

## 2020-01-21 DIAGNOSIS — I10 Essential (primary) hypertension: Secondary | ICD-10-CM

## 2020-01-21 DIAGNOSIS — Z794 Long term (current) use of insulin: Secondary | ICD-10-CM

## 2020-01-21 DIAGNOSIS — E1159 Type 2 diabetes mellitus with other circulatory complications: Secondary | ICD-10-CM

## 2020-01-21 LAB — POCT URINALYSIS DIPSTICK
Bilirubin, UA: NEGATIVE
Blood, UA: POSITIVE
Glucose, UA: POSITIVE — AB
Ketones, UA: POSITIVE
Nitrite, UA: NEGATIVE
Protein, UA: POSITIVE — AB
Spec Grav, UA: 1.025 (ref 1.010–1.025)
Urobilinogen, UA: 1 E.U./dL
pH, UA: 5.5 (ref 5.0–8.0)

## 2020-01-21 MED ORDER — NITROFURANTOIN MONOHYD MACRO 100 MG PO CAPS: 100.0000 mg | ORAL_CAPSULE | Freq: Two times a day (BID) | ORAL | 0 refills | Status: DC

## 2020-01-21 NOTE — Telephone Encounter (Signed)
Pt has been informed mediation has arrived and ready for pick up.

## 2020-01-21 NOTE — Patient Instructions (Signed)
Take the antibiotic as prescribed.  Take tylenol if needed.     Increase your water intake.   Call if no improvement     Urinary Tract Infection, Adult A urinary tract infection (UTI) is an infection of any part of the urinary tract, which includes the kidneys, ureters, bladder, and urethra. These organs make, store, and get rid of urine in the body. UTI can be a bladder infection (cystitis) or kidney infection (pyelonephritis). What are the causes? This infection may be caused by fungi, viruses, or bacteria. Bacteria are the most common cause of UTIs. This condition can also be caused by repeated incomplete emptying of the bladder during urination. What increases the risk? This condition is more likely to develop if:  You ignore your need to urinate or hold urine for long periods of time.  You do not empty your bladder completely during urination.  You wipe back to front after urinating or having a bowel movement, if you are female.  You are uncircumcised, if you are female.  You are constipated.  You have a urinary catheter that stays in place (indwelling).  You have a weak defense (immune) system.  You have a medical condition that affects your bowels, kidneys, or bladder.  You have diabetes.  You take antibiotic medicines frequently or for long periods of time, and the antibiotics no longer work well against certain types of infections (antibiotic resistance).  You take medicines that irritate your urinary tract.  You are exposed to chemicals that irritate your urinary tract.  You are female.  What are the signs or symptoms? Symptoms of this condition include:  Fever.  Frequent urination or passing small amounts of urine frequently.  Needing to urinate urgently.  Pain or burning with urination.  Urine that smells bad or unusual.  Cloudy urine.  Pain in the lower abdomen or back.  Trouble urinating.  Blood in the urine.  Vomiting or being less hungry than  normal.  Diarrhea or abdominal pain.  Vaginal discharge, if you are female.  How is this diagnosed? This condition is diagnosed with a medical history and physical exam. You will also need to provide a urine sample to test your urine. Other tests may be done, including:  Blood tests.  Sexually transmitted disease (STD) testing.  If you have had more than one UTI, a cystoscopy or imaging studies may be done to determine the cause of the infections. How is this treated? Treatment for this condition often includes a combination of two or more of the following:  Antibiotic medicine.  Other medicines to treat less common causes of UTI.  Over-the-counter medicines to treat pain.  Drinking enough water to stay hydrated.  Follow these instructions at home:  Take over-the-counter and prescription medicines only as told by your health care provider.  If you were prescribed an antibiotic, take it as told by your health care provider. Do not stop taking the antibiotic even if you start to feel better.  Avoid alcohol, caffeine, tea, and carbonated beverages. They can irritate your bladder.  Drink enough fluid to keep your urine clear or pale yellow.  Keep all follow-up visits as told by your health care provider. This is important.  Make sure to: ? Empty your bladder often and completely. Do not hold urine for long periods of time. ? Empty your bladder before and after sex. ? Wipe from front to back after a bowel movement if you are female. Use each tissue one time when you   wipe. Contact a health care provider if:  You have back pain.  You have a fever.  You feel nauseous or vomit.  Your symptoms do not get better after 3 days.  Your symptoms go away and then return. Get help right away if:  You have severe back pain or lower abdominal pain.  You are vomiting and cannot keep down any medicines or water. This information is not intended to replace advice given to you by  your health care provider. Make sure you discuss any questions you have with your health care provider. Document Released: 01/25/2005 Document Revised: 09/29/2015 Document Reviewed: 03/08/2015 Elsevier Interactive Patient Education  2018 [employer]   
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
## 2020-01-21 NOTE — Patient Instructions (Addendum)
Visit Information  Phone number for Pharmacist: [phone]  Goals Addressed            This Visit's Progress   . Pharmacy Care Plan       CARE PLAN ENTRY  Current Barriers:  . Chronic Disease Management support, education, and care coordination needs related to Hypertension, Hyperlipidemia, Diabetes, Atrial Fibrillation, and GERD   Hypertension/Atrial Fibrillation BP Readings from Last 3 Encounters:  10/09/19 100/60  10/02/19 120/60  09/19/19 126/64   Pulse Readings from Last 3 Encounters:  10/09/19 65  10/02/19 88  09/19/19 (!) 56 .  Pharmacist Clinical Goal(s): o Over the next 90 days, patient will work with PharmD and providers to achieve BP goal <130/80 and HR goal 80-110 . Current regimen:  o Bystolic 5 mg daily o Losartan-HCTZ 100-12.5 mg daily o Xarelto 20 mg daily . Interventions: o Discussed BP goal and benefits of medications o Recommended to call Engineer, maintenance for $80 Xarelto in the meantime o Pursue patient assistance for [employer] . Patient self care activities - Over the next 90 days, patient will: o Check BP 3-5 times weekly, document, and provide at future appointments o Ensure daily salt intake < 2300 mg/day o Sign patient assistance application for Bystolic in office  Hyperlipidemia/Peripheral artery disease Lab Results  Component Value Date/Time   LDLCALC 64 06/04/2019 11:22 AM   LDLDIRECT 160.2 03/01/2010 10:01 AM .  Pharmacist Clinical Goal(s): o Over the next 90 days, patient will work with PharmD and providers to maintain LDL goal < 70 . Current regimen:  o Pravastatin 40 mg daily at bedtime o Ezetimibe 10 mg daily at bedtime . Interventions: o Discussed cholesterol goals and benefits of medications for prevention of heart attack / stroke . Patient self care activities - Over the next 90 days, patient will: o Take cholesterol lowering medications as prescribed at bedtime  Diabetes Lab Results  Component Value Date/Time   HGBA1C 7.3  (A) 12/17/2019 10:46 AM   HGBA1C 6.3 (A) 09/03/2019 11:14 AM   HGBA1C 7.3 (H) 06/04/2019 11:22 AM   HGBA1C 8.4 (H) 01/08/2019 11:34 AM .  Pharmacist Clinical Goal(s): o Over the next 90 days, patient will work with PharmD and providers to maintain A1c goal <7% . Current regimen:  o Synjardy XR 12.08-998 mg once daily o Basaglar 60 units at bedtime o Humalog 10 units 3 times daily with meals o Ozempic 1 mg weekly . Interventions: o Discussed insulin resistance and difficulties with weight loss o Switched Victoza to [employer] for greater weight loss benefit o Patient assistance for all DM medications approved . Patient self care activities - Over the next 90 days, patient will: o Check blood sugar twice daily, in the morning before eating or drinking, and at bedtime, document, and provide at future appointments o Contact provider with any episodes of hypoglycemia  Medication management . Pharmacist Clinical Goal(s): o Over the next 90 days, patient will work with PharmD and providers to achieve optimal medication adherence and reduce medication costs . Current pharmacy: [facility] . Interventions o Comprehensive medication review performed. o Utilize [facility] pharmacy for medication synchronization, packaging and delivery . Patient self care activities - Over the next 90 days, patient will: o Focus on medication adherence by pill pack o Take medications as prescribed o Report any questions or concerns to PharmD and/or provider(s)  Please see past updates related to this goal by clicking on the "Past Updates" button in the selected goal  Patient verbalizes understanding of instructions provided today.  Telephone follow up appointment with pharmacy team member scheduled for: 3 months  Charlene Brooke, PharmD, BCACP Clinical Pharmacist [facility] [phone]  Diabetes Mellitus and Nutrition, Adult When you have diabetes (diabetes mellitus), it is  very important to have healthy eating habits because your blood sugar (glucose) levels are greatly affected by what you eat and drink. Eating healthy foods in the appropriate amounts, at about the same times every day, can help you:  Control your blood glucose.  Lower your risk of heart disease.  Improve your blood pressure.  Reach or maintain a healthy weight. Every person with diabetes is different, and each person has different needs for a meal plan. Your health care provider may recommend that you work with a diet and nutrition specialist (dietitian) to make a meal plan that is best for you. Your meal plan may vary depending on factors such as:  The calories you need.  The medicines you take.  Your weight.  Your blood glucose, blood pressure, and cholesterol levels.  Your activity level.  Other health conditions you have, such as heart or kidney disease. How do carbohydrates affect me? Carbohydrates, also called carbs, affect your blood glucose level more than any other type of food. Eating carbs naturally raises the amount of glucose in your blood. Carb counting is a method for keeping track of how many carbs you eat. Counting carbs is important to keep your blood glucose at a healthy level, especially if you use insulin or take certain oral diabetes medicines. It is important to know how many carbs you can safely have in each meal. This is different for every person. Your dietitian can help you calculate how many carbs you should have at each meal and for each snack. Foods that contain carbs include:  Bread, cereal, rice, pasta, and crackers.  Potatoes and corn.  Peas, beans, and lentils.  Milk and yogurt.  Fruit and juice.  Desserts, such as cakes, cookies, ice cream, and candy. How does alcohol affect me? Alcohol can cause a sudden decrease in blood glucose (hypoglycemia), especially if you use insulin or take certain oral diabetes medicines. Hypoglycemia can be a  life-threatening condition. Symptoms of hypoglycemia (sleepiness, dizziness, and confusion) are similar to symptoms of having too much alcohol. If your health care provider says that alcohol is safe for you, follow these guidelines:  Limit alcohol intake to no more than 1 drink per day for nonpregnant women and 2 drinks per day for men. One drink equals 12 oz of beer, 5 oz of wine, or 1 oz of hard liquor.  Do not drink on an empty stomach.  Keep yourself hydrated with water, diet soda, or unsweetened iced tea.  Keep in mind that regular soda, juice, and other mixers may contain a lot of sugar and must be counted as carbs. What are tips for following this plan?  Reading food labels  Start by checking the serving size on the "Nutrition Facts" label of packaged foods and drinks. The amount of calories, carbs, fats, and other nutrients listed on the label is based on one serving of the item. Many items contain more than one serving per package.  Check the total grams (g) of carbs in one serving. You can calculate the number of servings of carbs in one serving by dividing the total carbs by 15. For example, if a food has 30 g of total carbs, it would be equal  to 2 servings of carbs.  Check the number of grams (g) of saturated and trans fats in one serving. Choose foods that have low or no amount of these fats.  Check the number of milligrams (mg) of salt (sodium) in one serving. Most people should limit total sodium intake to less than 2,300 mg per day.  Always check the nutrition information of foods labeled as "low-fat" or "nonfat". These foods may be higher in added sugar or refined carbs and should be avoided.  Talk to your dietitian to identify your daily goals for nutrients listed on the label. Shopping  Avoid buying canned, premade, or processed foods. These foods tend to be high in fat, sodium, and added sugar.  Shop around the outside edge of the grocery store. This includes fresh  fruits and vegetables, bulk grains, fresh meats, and fresh dairy. Cooking  Use low-heat cooking methods, such as baking, instead of high-heat cooking methods like deep frying.  Cook using healthy oils, such as olive, canola, or sunflower oil.  Avoid cooking with butter, cream, or high-fat meats. Meal planning  Eat meals and snacks regularly, preferably at the same times every day. Avoid going long periods of time without eating.  Eat foods high in fiber, such as fresh fruits, vegetables, beans, and whole grains. Talk to your dietitian about how many servings of carbs you can eat at each meal.  Eat 4-6 ounces (oz) of lean protein each day, such as lean meat, chicken, fish, eggs, or tofu. One oz of lean protein is equal to: ? 1 oz of meat, chicken, or fish. ? 1 egg. ?  cup of tofu.  Eat some foods each day that contain healthy fats, such as avocado, nuts, seeds, and fish. Lifestyle  Check your blood glucose regularly.  Exercise regularly as told by your health care provider. This may include: ? 150 minutes of moderate-intensity or vigorous-intensity exercise each week. This could be brisk walking, biking, or water aerobics. ? Stretching and doing strength exercises, such as yoga or weightlifting, at least 2 times a week.  Take medicines as told by your health care provider.  Do not use any products that contain nicotine or tobacco, such as cigarettes and e-cigarettes. If you need help quitting, ask your health care provider.  Work with a Social worker or diabetes educator to identify strategies to manage stress and any emotional and social challenges. Questions to ask a health care provider  Do I need to meet with a diabetes educator?  Do I need to meet with a dietitian?  What number can I call if I have questions?  When are the best times to check my blood glucose? Where to find more information:  American Diabetes Association: diabetes.org  Academy of Nutrition and  Dietetics: www.eatright.[employer] of Diabetes and Digestive and Kidney Diseases (NIH): [employer] Summary  A healthy meal plan will help you control your blood glucose and maintain a healthy lifestyle.  Working with a diet and nutrition specialist (dietitian) can help you make a meal plan that is best for you.  Keep in mind that carbohydrates (carbs) and alcohol have immediate effects on your blood glucose levels. It is important to count carbs and to use alcohol carefully. This information is not intended to replace advice given to you by your health care provider. Make sure you discuss any questions you have with your health care provider. Document Revised: 03/30/2017 Document Reviewed: 05/22/2016 Elsevier Patient Education  2020 [employer].

## 2020-01-23 LAB — URINE CULTURE

## 2020-01-28 ENCOUNTER — Telehealth (INDEPENDENT_AMBULATORY_CARE_PROVIDER_SITE_OTHER): Payer: PPO | Admitting: Cardiology

## 2020-01-28 ENCOUNTER — Other Ambulatory Visit: Payer: Self-pay

## 2020-01-28 ENCOUNTER — Encounter: Payer: Self-pay | Admitting: Cardiology

## 2020-01-28 DIAGNOSIS — I4819 Other persistent atrial fibrillation: Secondary | ICD-10-CM | POA: Diagnosis not present

## 2020-01-28 DIAGNOSIS — I342 Nonrheumatic mitral (valve) stenosis: Secondary | ICD-10-CM | POA: Diagnosis not present

## 2020-01-28 NOTE — Patient Instructions (Signed)
Medication Instructions:  Your provider recommends that you continue on your current medications as directed. Please refer to the Current Medication list given to you today.   *If you need a refill on your cardiac medications before your next appointment, please call your pharmacy*  Follow-Up: At [facility], you and your health needs are our priority.  As part of our continuing mission to provide you with exceptional heart care, we have created designated Provider Care Teams.  These Care Teams include your primary Cardiologist (physician) and Advanced Practice Providers (APPs -  Physician Assistants and Nurse Practitioners) who all work together to provide you with the care you need, when you need it. Your next appointment:   12 month(s) The format for your next appointment:   In Person Provider:   You may see [name], MD or one of the following Advanced Practice Providers on your designated Care Team:    Richardson Dopp, PA-C  [place], [place]

## 2020-01-28 NOTE — Progress Notes (Signed)
Virtual Visit via Telephone Note   This visit type was conducted due to national recommendations for restrictions regarding the COVID-19 Pandemic (e.g. social distancing) in an effort to limit this patient's exposure and mitigate transmission in our community.  Due to her co-morbid illnesses, this patient is at least at moderate risk for complications without adequate follow up.  This format is felt to be most appropriate for this patient at this time.  The patient did not have access to video technology/had technical difficulties with video requiring transitioning to audio format only (telephone).  All issues noted in this document were discussed and addressed.  No physical exam could be performed with this format.  Please refer to the patient's chart for her  consent to telehealth for [facility].    Date:  01/28/2020   ID:  Theresa Harrington, DOB 21-Jan-1956, MRN [id number] The patient was identified using 2 identifiers.  Patient Location: Home Provider Location: Office/Clinic  PCP:  [name], MD  Cardiologist:  [name], MD  Electrophysiologist:  None   Evaluation Performed:  Follow-Up Visit  Chief Complaint: Follow-up mitral regurg  History of Present Illness:    Theresa Harrington is a 64 y.o. 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  There is very mild stenosis.  3 mmHg gradient.  No mitral regurgitation.  Discussed with her via telephone.  She is doing well.  No fevers no chills no nausea no vomiting.  No shortness of breath.  She is ecstatic about the results of her echocardiogram.  The patient does not have symptoms concerning for COVID-19 infection (fever, chills, cough, or new shortness of breath).    Past Medical History:  Diagnosis Date   Allergy    Anxiety    Pt. denies   Arthritis    right index figer   Asymptomatic cholelithiasis    Atherosclerosis of aorta  (HCC)    Cataract    Clotting disorder ([place])    Gallstones 06/2013   GERD (gastroesophageal reflux disease)    History of kidney stones    lithotrispy   Hyperlipidemia    Hypertension    Hypothyroidism    no meds   Iron deficiency anemia    PAD (peripheral artery disease) (HCC)    PAF (paroxysmal atrial fibrillation) (HCC)    Peripheral arterial occlusive disease (HCC)    lower extremities   Pneumonia    PONV (postoperative nausea and vomiting)    Right ureteral stone    Type 2 diabetes mellitus ([place])    Type   Vitamin B 12 deficiency    Wears glasses    Past Surgical History:  Procedure Laterality Date   AMPUTATION Left 07/16/2014   Procedure: LEFT SECOND TOE AMPUTATION;  Surgeon: Serafina Mitchell, MD;  Location: [facility];  Service: Vascular;  Laterality: Left;  With Nerve block   AUGMENTATION MAMMAPLASTY     BELPHAROPTOSIS REPAIR     eyelid lift   BIOPSY  10/14/2018   Procedure: BIOPSY;  Surgeon: Rush Landmark Telford Nab., MD;  Location: [place];  Service: Gastroenterology;;   BIOPSY  08/04/2019   Procedure: BIOPSY;  Surgeon: Irving Copas., MD;  Location: [place];  Service: Gastroenterology;;   CARDIOVERSION N/A 09/19/2019   Procedure: CARDIOVERSION;  Surgeon: Buford Dresser, MD;  Location: [place];  Service: Cardiovascular;  Laterality: N/A;   COLONOSCOPY     COMBINED AUGMENTATION MAMMAPLASTY AND ABDOMINOPLASTY  2009  W/  BILATERAL  THIGH LIFT   CYSTO/  RIGHT URETERAL STENT PLACEMENT  12-30-2010   CYSTOSCOPY WITH RETROGRADE PYELOGRAM, URETEROSCOPY AND STENT PLACEMENT Right 10/07/2013   Procedure: CYSTOSCOPY WITH RETROGRADE PYELOGRAM, right URETEROSCOPY AND STENT PLACEMENT, stone extraction;  Surgeon: Arvil Persons, MD;  Location: [facility];  Service: Urology;  Laterality: Right;   CYSTOSCOPY WITH STENT PLACEMENT Right 06/04/2013   Procedure: CYSTOSCOPY WITH STENT PLACEMENT;  Surgeon: Franchot Gallo, MD;   Location: [facility];  Service: Urology;  Laterality: Right;   DILATATION & CURETTAGE/HYSTEROSCOPY WITH MYOSURE N/A 04/27/2015   Procedure: DILATATION & CURETTAGE/HYSTEROSCOPY WITH MYOSURE;  Surgeon: Terrance Mass, MD;  Location: [place] [facility];  Service: Gynecology;  Laterality: N/A;   ENDARTERECTOMY FEMORAL Left 06/08/2014   Procedure: Left Leg Common Femoral and External Iliac  Endartarectomy with patch Angioplasty;  Surgeon: Rosetta Posner, MD;  Location: [facility];  Service: Vascular;  Laterality: Left;   ESOPHAGOGASTRODUODENOSCOPY N/A 10/14/2018   Procedure: ESOPHAGOGASTRODUODENOSCOPY (EGD);  Surgeon: Irving Copas., MD;  Location: [place];  Service: Gastroenterology;  Laterality: N/A;   ESOPHAGOGASTRODUODENOSCOPY (EGD) WITH PROPOFOL N/A 11/06/2018   Procedure: ESOPHAGOGASTRODUODENOSCOPY (EGD) WITH PROPOFOL;  Surgeon: Rush Landmark Telford Nab., MD;  [facility];  Service: Gastroenterology;  Laterality: N/A;  RFA   ESOPHAGOGASTRODUODENOSCOPY (EGD) WITH PROPOFOL N/A 02/12/2019   Procedure: ESOPHAGOGASTRODUODENOSCOPY (EGD) WITH PROPOFOL;  Surgeon: Rush Landmark Telford Nab., MD;  [facility];  Service: Gastroenterology;  Laterality: N/A;   ESOPHAGOGASTRODUODENOSCOPY (EGD) WITH PROPOFOL N/A 08/04/2019   Procedure: ESOPHAGOGASTRODUODENOSCOPY (EGD) WITH PROPOFOL;  Surgeon: Rush Landmark Telford Nab., MD;  Location: [place];  Service: Gastroenterology;  Laterality: N/A;  WITH RFA   EXTRACORPOREAL SHOCK WAVE LITHOTRIPSY Right 08-04-2013//   06-23-2013//   01-16-2011   EYE SURGERY Bilateral    cataract   FEMORAL-POPLITEAL BYPASS GRAFT Left 06/08/2014   Procedure: Left Leg Femoral -Popliteal Bypass Graft;  Surgeon: Rosetta Posner, MD;  Location: [facility];  Service: Vascular;  Laterality: Left;   FEMORAL-POPLITEAL BYPASS GRAFT Left 06/09/2014   Procedure: Left Femoral and Popliteal Exposure; Left Femoral to Anterior Tibial Bypass Graft using Propaten 12m by 80cm Goretex Graft; Left Tibial  Endarterectomy; Left Femoraland Popliteal Thrombectomy ;  Surgeon: VSerafina Mitchell MD;  [place]  Service: Vascular;  Laterality: Left;   GI RADIOFREQUENCY ABLATION N/A 11/06/2018   Procedure: GI RADIOFREQUENCY ABLATION;  Surgeon: MIrving Copas, MD;  [facility];  Service: Gastroenterology;  Laterality: N/A;   GI RADIOFREQUENCY ABLATION N/A 02/12/2019   Procedure: GI RADIOFREQUENCY ABLATION;  Surgeon: MRush LandmarkGTelford Nab, MD;  [facility];  Service: Gastroenterology;  Laterality: N/A;   GI RADIOFREQUENCY ABLATION N/A 08/04/2019   Procedure: GI RADIOFREQUENCY ABLATION;  Surgeon: MRush LandmarkGTelford Nab, MD;  [place]  Service: Gastroenterology;  Laterality: N/A;   HOLMIUM LASER APPLICATION Right 66/06/7033  Procedure: HOLMIUM LASER APPLICATION;  Surgeon: MArvil Persons MD;  [facility]  Service: Urology;  Laterality: Right;   KIDNEY STONE SURGERY  April 2015   1-2 stones   LAPAROSCOPIC GASTRIC BANDING  05-29-2005   LITHOTRIPSY  2-3 times   LOWER EXTREMITY ANGIOGRAM N/A 06/04/2014   Procedure: LOWER EXTREMITY ANGIOGRAM;  Surgeon: VSerafina Mitchell MD;  [facility];  Service: Cardiovascular;  Laterality: N/A;   ORIF FIFTH METACARPAL FX  RIGHT HAND  04-21-2002   REVISION AND RE-SITING LAP-BAND PORT  04-08-2010   W/  UPPER EGD   RIGHT KNEE PATELLECTOMY W/ REPAIR  OF EXTENSOR MECHANISM  04-14-2002   TEE WITHOUT CARDIOVERSION N/A 12/24/2019   Procedure: TRANSESOPHAGEAL ECHOCARDIOGRAM (TEE);  Surgeon: Sueanne Margarita, MD;  Location: [facility];  Service: Cardiovascular;  Laterality: N/A;   Toenail removed Left Jan. 21, 2016   2nd toenail-  Dr. Barkley Bruns     Current Meds  Medication Sig   Blood Glucose Monitoring Suppl (ONETOUCH VERIO IQ SYSTEM) w/Device KIT USE TO CHECK SUGAR DAILY   cetirizine (ZYRTEC) 10 MG tablet Take 10 mg by mouth daily as needed for allergies.   Cyanocobalamin (B-12  COMPLIANCE INJECTION IJ) Inject 1,000 mcg as directed every 30 (thirty) days.   dorzolamide-timolol (COSOPT) 22.3-6.8 MG/ML ophthalmic solution Place 1 drop into the right eye 2 (two) times daily.    Empagliflozin-metFORMIN HCl ER (SYNJARDY XR) 12.08-998 MG TB24 Take 1 tablet by mouth daily.   ezetimibe (ZETIA) 10 MG tablet TAKE ONE TABLET BY MOUTH EVERYDAY AT BEDTIME   glucose blood test strip Use to check blood sugar twice daily. DX: E11.8   Insulin Glargine (BASAGLAR KWIKPEN) 100 UNIT/ML Inject 0.3 mLs (30 Units total) into the skin daily.   insulin lispro (HUMALOG) 100 UNIT/ML injection Inject 14-20 Units into the skin 3 (three) times daily before meals.    Insulin Pen Needle (PEN NEEDLES) 32G X 4 MM MISC Inject 1 pen as directed 4 (four) times daily. Use to inject levemir or novolog as directed.   latanoprost (XALATAN) 0.005 % ophthalmic solution Place 1 drop into both eyes at bedtime.    losartan-hydrochlorothiazide (HYZAAR) 100-12.5 MG tablet TAKE ONE TABLET BY MOUTH EVERYDAY AT BEDTIME   magnesium oxide (MAG-OX) 400 MG tablet TAKE ONE TABLET BY MOUTH EVERYDAY AT BEDTIME   nebivolol (BYSTOLIC) 5 MG tablet Take 1 tablet (5 mg total) by mouth daily.   nitrofurantoin, macrocrystal-monohydrate, (MACROBID) 100 MG capsule Take 1 capsule (100 mg total) by mouth 2 (two) times daily.   omeprazole (PRILOSEC) 40 MG capsule TAKE ONE CAPSULE BY MOUTH EVERY MORNING and TAKE ONE CAPSULE BY MOUTH EVERY EVENING   potassium chloride SA (KLOR-CON) 20 MEQ tablet Take 1 tablet (20 mEq total) by mouth daily.   pravastatin (PRAVACHOL) 40 MG tablet TAKE ONE TABLET BY MOUTH EVERYDAY AT BEDTIME   rivaroxaban (XARELTO) 20 MG TABS tablet Take 1 tablet (20 mg total) by mouth daily.   Semaglutide, 1 MG/DOSE, (OZEMPIC, 1 MG/DOSE,) 4 MG/3ML SOPN Inject 0.75 mLs (1 mg total) into the skin once a week. (Patient taking differently: Inject 1 mg into the skin every Friday. )   thiamine 100 MG tablet Take 1  tablet (100 mg total) by mouth every other day.     Allergies:   Amlodipine, Atorvastatin, Ampicillin, Codeine, Lisinopril, Penicillins, and Januvia [sitagliptin]   Social History   Tobacco Use   Smoking status: Former Smoker    Packs/day: 1.00    Years: 28.00    Pack years: 28.00    Types: Cigarettes    Quit date: 09/05/2001    Years since quitting: 18.4   Smokeless tobacco: Never Used  Vaping Use   Vaping Use: Never used  Substance Use Topics   Alcohol use: No    Alcohol/week: 0.0 standard drinks   Drug use: No     Family Hx: The patient's family history includes Arthritis in an other family member; Cancer in her brother and another family member; Colon cancer in her maternal aunt; Deep vein thrombosis in her father and sister; Diabetes in her brother, brother, brother, father, and sister;  Heart disease in her brother, father, and sister; Hyperlipidemia in her brother, father, mother, and sister; Hypertension in an other family member; Kidney disease in her mother; Other in her mother; Stroke in an other family member. There is no history of Esophageal cancer, Stomach cancer, Inflammatory bowel disease, Liver disease, or Pancreatic cancer.  ROS:   Please see the history of present illness.     All other systems reviewed and are negative.   Prior CV studies:   The following studies were reviewed today:  TEE reviewed  Labs/Other Tests and Data Reviewed:      Recent Labs: 06/04/2019: TSH 0.42 09/04/2019: Magnesium 1.8 12/08/2019: BUN 17; Creatinine, Ser 0.88; Hemoglobin 13.9; Platelets 247; Potassium 3.5; Sodium 138   Recent Lipid Panel Lab Results  Component Value Date/Time   CHOL 134 06/04/2019 11:22 AM   TRIG 114.0 06/04/2019 11:22 AM   HDL 47.90 06/04/2019 11:22 AM   CHOLHDL 3 06/04/2019 11:22 AM   LDLCALC 64 06/04/2019 11:22 AM   LDLDIRECT 160.2 03/01/2010 10:01 AM    Wt Readings from Last 3 Encounters:  01/28/20 170 lb (77.1 kg)  01/21/20 171 lb (77.6 kg)   01/07/20 174 lb (78.9 kg)     Objective:    Vital Signs:  BP 120/86    Pulse 61    Ht '5\' 5"'  (1.651 m)    Wt 170 lb (77.1 kg)    LMP  (LMP Unknown)    SpO2 97%    BMI 28.29 kg/m    VITAL SIGNS:  reviewed  ASSESSMENT & PLAN:    Mitral stenosis-mild -Reassuring TEE.  No further intervention required. -No symptoms.  Paroxysmal atrial fibrillation -Continue with Xarelto.  No bleeding.  COVID-19 Education: The signs and symptoms of COVID-19 were discussed with the patient and how to seek care for testing (follow up with PCP or arrange E-visit).  The importance of social distancing was discussed today.  Time:   Today, I have spent 20 minutes with the patient with telehealth technology discussing the above problems, review of transesophageal echocardiogram personally, review of prior clinical data.     Medication Adjustments/Labs and Tests Ordered: Current medicines are reviewed at length with the patient today.  Concerns regarding medicines are outlined above.   Tests Ordered: No orders of the defined types were placed in this encounter.   Medication Changes: No orders of the defined types were placed in this encounter.   Follow Up:  In Person in 1 year(s)  Signed, [name], MD  01/28/2020 10:00 AM    [facility]

## 2020-02-04 ENCOUNTER — Telehealth: Payer: Self-pay | Admitting: Internal Medicine

## 2020-02-04 NOTE — Telephone Encounter (Signed)
Patient called and was wanting Mendel Ryder to give her a call. She can be reached at [phone]

## 2020-02-04 NOTE — Progress Notes (Signed)
Per patient she needs refill of Synjardy. She is approved for free medication through [facility] and medication refill was requested today. Per representative medication will be shipped on 02/08/20 and will arrive in 5-7 business days.

## 2020-02-05 DIAGNOSIS — E1151 Type 2 diabetes mellitus with diabetic peripheral angiopathy without gangrene: Secondary | ICD-10-CM | POA: Diagnosis not present

## 2020-02-05 DIAGNOSIS — S91311A Laceration without foreign body, right foot, initial encounter: Secondary | ICD-10-CM | POA: Diagnosis not present

## 2020-02-05 DIAGNOSIS — S98132A Complete traumatic amputation of one left lesser toe, initial encounter: Secondary | ICD-10-CM | POA: Diagnosis not present

## 2020-02-11 ENCOUNTER — Other Ambulatory Visit: Payer: Self-pay

## 2020-02-11 ENCOUNTER — Telehealth: Payer: Self-pay | Admitting: Pharmacist

## 2020-02-11 ENCOUNTER — Ambulatory Visit (INDEPENDENT_AMBULATORY_CARE_PROVIDER_SITE_OTHER): Payer: PPO | Admitting: Internal Medicine

## 2020-02-11 ENCOUNTER — Encounter: Payer: Self-pay | Admitting: Internal Medicine

## 2020-02-11 VITALS — BP 142/86 | HR 56 | Temp 97.8°F | Resp 16 | Ht 65.0 in | Wt 173.0 lb

## 2020-02-11 DIAGNOSIS — Z1612 Extended spectrum beta lactamase (ESBL) resistance: Secondary | ICD-10-CM

## 2020-02-11 DIAGNOSIS — B9629 Other Escherichia coli [E. coli] as the cause of diseases classified elsewhere: Secondary | ICD-10-CM | POA: Diagnosis not present

## 2020-02-11 DIAGNOSIS — I10 Essential (primary) hypertension: Secondary | ICD-10-CM | POA: Diagnosis not present

## 2020-02-11 DIAGNOSIS — E1159 Type 2 diabetes mellitus with other circulatory complications: Secondary | ICD-10-CM

## 2020-02-11 DIAGNOSIS — Z794 Long term (current) use of insulin: Secondary | ICD-10-CM | POA: Diagnosis not present

## 2020-02-11 DIAGNOSIS — N3000 Acute cystitis without hematuria: Secondary | ICD-10-CM

## 2020-02-11 DIAGNOSIS — F322 Major depressive disorder, single episode, severe without psychotic features: Secondary | ICD-10-CM | POA: Diagnosis not present

## 2020-02-11 DIAGNOSIS — N390 Urinary tract infection, site not specified: Secondary | ICD-10-CM | POA: Diagnosis not present

## 2020-02-11 MED ORDER — GVOKE HYPOPEN 2-PACK 1 MG/0.2ML ~~LOC~~ SOAJ: 1.0000 | Freq: Every day | SUBCUTANEOUS | 5 refills | Status: DC | PRN

## 2020-02-11 MED ORDER — VIIBRYD STARTER PACK 10 & 20 MG PO KIT: 1.0000 | PACK | Freq: Every day | ORAL | 0 refills | Status: DC

## 2020-02-11 NOTE — Patient Instructions (Signed)
Major Depressive Disorder, Adult Major depressive disorder (MDD) is a mental health condition. It may also be called clinical depression or unipolar depression. MDD usually causes feelings of sadness, hopelessness, or helplessness. MDD can also cause physical symptoms. It can interfere with work, school, relationships, and other everyday activities. MDD may be mild, moderate, or severe. It may occur once (single episode major depressive disorder) or it may occur multiple times (recurrent major depressive disorder). What are the causes? The exact cause of this condition is not known. MDD is most likely caused by a combination of things, which may include:  Genetic factors. These are traits that are passed along from parent to child.  Individual factors. Your personality, your behavior, and the way you handle your thoughts and feelings may contribute to MDD. This includes personality traits and behaviors learned from others.  Physical factors, such as: ? Differences in the part of your brain that controls emotion. This part of your brain may be different than it is in people who do not have MDD. ? Long-term (chronic) medical or psychiatric illnesses.  Social factors. Traumatic experiences or major life changes may play a role in the development of MDD. What increases the risk? This condition is more likely to develop in women. The following factors may also make you more likely to develop MDD:  A family history of depression.  Troubled family relationships.  Abnormally low levels of certain brain chemicals.  Traumatic events in childhood, especially abuse or the loss of a parent.  Being under a lot of stress, or long-term stress, especially from upsetting life experiences or losses.  A history of: ? Chronic physical illness. ? Other mental health disorders. ? Substance abuse.  Poor living conditions.  Experiencing social exclusion or discrimination on a regular basis. What are the  signs or symptoms? The main symptoms of MDD typically include:  Constant depressed or irritable mood.  Loss of interest in things and activities. MDD symptoms may also include:  Sleeping or eating too much or too little.  Unexplained weight change.  Fatigue or low energy.  Feelings of worthlessness or guilt.  Difficulty thinking clearly or making decisions.  Thoughts of suicide or of harming others.  Physical agitation or weakness.  Isolation. Severe cases of MDD may also occur with other symptoms, such as:  Delusions or hallucinations, in which you imagine things that are not real (psychotic depression).  Low-level depression that lasts at least a year (chronic depression or persistent depressive disorder).  Extreme sadness and hopelessness (melancholic depression).  Trouble speaking and moving (catatonic depression). How is this diagnosed? This condition may be diagnosed based on:  Your symptoms.  Your medical history, including your mental health history. This may involve tests to evaluate your mental health. You may be asked questions about your lifestyle, including any drug and alcohol use, and how long you have had symptoms of MDD.  A physical exam.  Blood tests to rule out other conditions. You must have a depressed mood and at least four other MDD symptoms most of the day, nearly every day in the same 2-week timeframe before your health care provider can confirm a diagnosis of MDD. How is this treated? This condition is usually treated by mental health professionals, such as psychologists, psychiatrists, and clinical social workers. You may need more than one type of treatment. Treatment may include:  Psychotherapy. This is also called talk therapy or counseling. Types of psychotherapy include: ? Cognitive behavioral therapy (CBT). This type of therapy   teaches you to recognize unhealthy feelings, thoughts, and behaviors, and replace them with positive thoughts  and actions. ? Interpersonal therapy (IPT). This helps you to improve the way you relate to and communicate with others. ? Family therapy. This treatment includes members of your family.  Medicine to treat anxiety and depression, or to help you control certain emotions and behaviors.  Lifestyle changes, such as: ? Limiting alcohol and drug use. ? Exercising regularly. ? Getting plenty of sleep. ? Making healthy eating choices. ? Spending more time outdoors.  Treatments involving stimulation of the brain can be used in situations with extremely severe symptoms, or when medicine or other therapies do not work over time. These treatments include electroconvulsive therapy, transcranial magnetic stimulation, and vagal nerve stimulation. Follow these instructions at home: Activity  Return to your normal activities as told by your health care provider.  Exercise regularly and spend time outdoors as told by your health care provider. General instructions  Take over-the-counter and prescription medicines only as told by your health care provider.  Do not drink alcohol. If you drink alcohol, limit your alcohol intake to no more than 1 drink a day for nonpregnant women and 2 drinks a day for men. One drink equals 12 oz of beer, 5 oz of wine, or 1 oz of hard liquor. Alcohol can affect any antidepressant medicines you are taking. Talk to your health care provider about your alcohol use.  Eat a healthy diet and get plenty of sleep.  Find activities that you enjoy doing, and make time to do them.  Consider joining a support group. Your health care provider may be able to recommend a support group.  Keep all follow-up visits as told by your health care provider. This is important. Where to find more information National Alliance on Mental Illness  www.nami.org [place] National Institute of Mental Health  www.nimh.nih.gov National Suicide Prevention Lifeline  1-800-273-TALK (8255). This is  free, 24-hour help. Contact a health care provider if:  Your symptoms get worse.  You develop new symptoms. Get help right away if:  You self-harm.  You have serious thoughts about hurting yourself or others.  You see, hear, taste, smell, or feel things that are not present (hallucinate). This information is not intended to replace advice given to you by your health care provider. Make sure you discuss any questions you have with your health care provider. Document Revised: 03/30/2017 Document Reviewed: 10/27/2015 Elsevier Patient Education  2020 [employer]  

## 2020-02-11 NOTE — Progress Notes (Signed)
Subjective:  Patient ID: Theresa Harrington, female    DOB: 26-Sep-1955  Age: 64 y.o. MRN: [id number]  CC: Hypertension, Depression, and Urinary Tract Infection  This visit occurred during the SARS-CoV-2 public health emergency.  Safety protocols were in place, including screening questions prior to the visit, additional usage of staff PPE, and extensive cleaning of exam room while observing appropriate contact time as indicated for disinfecting solutions.    HPI Theresa Harrington presents for f/up - She was recently treated for a UTI with nitrofurantoin. She feels much better but continues to clo dysuria. She wants to do a UDS to present to her employer. She complains of feeling depressed with anhedonia, crying spells, feeling hopeless and helpless.  She denies SI or HI.  Outpatient Medications Prior to Visit  Medication Sig Dispense Refill  . Blood Glucose Monitoring Suppl (ONETOUCH VERIO IQ SYSTEM) w/Device KIT USE TO CHECK SUGAR DAILY 1 kit 0  . cetirizine (ZYRTEC) 10 MG tablet Take 10 mg by mouth daily as needed for allergies.    . Cyanocobalamin (B-12 COMPLIANCE INJECTION IJ) Inject 1,000 mcg as directed every 30 (thirty) days.    . dorzolamide-timolol (COSOPT) 22.3-6.8 MG/ML ophthalmic solution Place 1 drop into the right eye 2 (two) times daily.     . Empagliflozin-metFORMIN HCl ER (SYNJARDY XR) 12.08-998 MG TB24 Take 1 tablet by mouth daily. 90 tablet 1  . ezetimibe (ZETIA) 10 MG tablet TAKE ONE TABLET BY MOUTH EVERYDAY AT BEDTIME 90 tablet 1  . glucose blood test strip Use to check blood sugar twice daily. DX: E11.8 200 each 3  . Insulin Glargine (BASAGLAR KWIKPEN) 100 UNIT/ML Inject 0.3 mLs (30 Units total) into the skin daily. 15 mL 1  . insulin lispro (HUMALOG) 100 UNIT/ML injection Inject 14-20 Units into the skin 3 (three) times daily before meals.     . Insulin Pen Needle (PEN NEEDLES) 32G X 4 MM MISC Inject 1 pen as directed 4 (four) times daily. Use to inject levemir or novolog as  directed. 400 each 3  . latanoprost (XALATAN) 0.005 % ophthalmic solution Place 1 drop into both eyes at bedtime.     [name] losartan-hydrochlorothiazide (HYZAAR) 100-12.5 MG tablet TAKE ONE TABLET BY MOUTH EVERYDAY AT BEDTIME 90 tablet 1  . magnesium oxide (MAG-OX) 400 MG tablet TAKE ONE TABLET BY MOUTH EVERYDAY AT BEDTIME 90 tablet 1  . nebivolol (BYSTOLIC) 5 MG tablet Take 1 tablet (5 mg total) by mouth daily. 90 tablet 1  . omeprazole (PRILOSEC) 40 MG capsule TAKE ONE CAPSULE BY MOUTH EVERY MORNING and TAKE ONE CAPSULE BY MOUTH EVERY EVENING 180 capsule 1  . potassium chloride SA (KLOR-CON) 20 MEQ tablet Take 1 tablet (20 mEq total) by mouth daily. 90 tablet 1  . pravastatin (PRAVACHOL) 40 MG tablet TAKE ONE TABLET BY MOUTH EVERYDAY AT BEDTIME 90 tablet 1  . rivaroxaban (XARELTO) 20 MG TABS tablet Take 1 tablet (20 mg total) by mouth daily. 90 tablet 1  . Semaglutide, 1 MG/DOSE, (OZEMPIC, 1 MG/DOSE,) 4 MG/3ML SOPN Inject 0.75 mLs (1 mg total) into the skin once a week. (Patient taking differently: Inject 1 mg into the skin every Friday. ) 9 mL 1  . thiamine 100 MG tablet Take 1 tablet (100 mg total) by mouth every other day. 45 tablet 1  . nitrofurantoin, macrocrystal-monohydrate, (MACROBID) 100 MG capsule Take 1 capsule (100 mg total) by mouth 2 (two) times daily. 14 capsule 0   No facility-administered medications prior to visit.  ROS Review of Systems  Constitutional: Negative for chills, diaphoresis, fatigue and fever.  HENT: Negative.  Negative for trouble swallowing.   Eyes: Negative for visual disturbance.  Respiratory: Negative for chest tightness, shortness of breath and wheezing.   Cardiovascular: Negative for chest pain, palpitations and leg swelling.  Gastrointestinal: Negative for abdominal pain, constipation, diarrhea, nausea and vomiting.  Endocrine: Negative.   Genitourinary: Positive for dysuria. Negative for decreased urine volume, difficulty urinating, frequency,  hematuria and urgency.  Musculoskeletal: Negative.  Negative for arthralgias and myalgias.  Skin: Negative.  Negative for color change and pallor.  Neurological: Negative.  Negative for dizziness, weakness, light-headedness, numbness and headaches.  Hematological: Negative for adenopathy. Does not bruise/bleed easily.  Psychiatric/Behavioral: Positive for dysphoric mood and sleep disturbance. Negative for decreased concentration, self-injury and suicidal ideas. The patient is nervous/anxious. The patient is not hyperactive.     Objective:  BP (!) 142/86   Pulse (!) 56   Temp 97.8 F (36.6 C) (Oral)   Resp 16   Ht '5\' 5"'  (1.651 m)   Wt 173 lb (78.5 kg)   LMP  (LMP Unknown)   SpO2 98%   BMI 28.79 kg/m   BP Readings from Last 3 Encounters:  02/11/20 (!) 142/86  01/28/20 120/86  01/21/20 102/62    Wt Readings from Last 3 Encounters:  02/11/20 173 lb (78.5 kg)  01/28/20 170 lb (77.1 kg)  01/21/20 171 lb (77.6 kg)    Physical Exam Vitals reviewed.  Constitutional:      Appearance: Normal appearance.  HENT:     Nose: Nose normal.     Mouth/Throat:     Mouth: Mucous membranes are moist.  Eyes:     General: No scleral icterus.    Conjunctiva/sclera: Conjunctivae normal.  Cardiovascular:     Rate and Rhythm: Normal rate and regular rhythm.     Heart sounds: No murmur heard.   Pulmonary:     Effort: Pulmonary effort is normal.     Breath sounds: No stridor. No wheezing, rhonchi or rales.  Abdominal:     General: Abdomen is protuberant. Bowel sounds are normal. There is no distension.     Palpations: Abdomen is soft. There is no hepatomegaly, splenomegaly or mass.     Tenderness: There is no abdominal tenderness.  Musculoskeletal:        General: Normal range of motion.     Cervical back: Neck supple.     Right lower leg: No edema.     Left lower leg: No edema.  Lymphadenopathy:     Cervical: No cervical adenopathy.  Skin:    General: Skin is warm and dry.    Neurological:     General: No focal deficit present.     Mental Status: She is alert.  Psychiatric:        Attention and Perception: Attention normal.        Mood and Affect: Mood is anxious and depressed. Affect is tearful.        Speech: Speech normal. Speech is not rapid and pressured, delayed or tangential.        Behavior: Behavior normal. Behavior is cooperative.        Thought Content: Thought content normal. Thought content is not paranoid or delusional. Thought content does not include homicidal ideation.        Cognition and Memory: Cognition normal.     Lab Results  Component Value Date   WBC 9.8 12/08/2019   HGB 13.9 12/08/2019  HCT 41.9 12/08/2019   PLT 247 12/08/2019   GLUCOSE 95 12/08/2019   CHOL 134 06/04/2019   TRIG 114.0 06/04/2019   HDL 47.90 06/04/2019   LDLDIRECT 160.2 03/01/2010   LDLCALC 64 06/04/2019   ALT 20 10/08/2018   AST 18 10/08/2018   NA 138 12/08/2019   K 3.5 12/08/2019   CL 99 12/08/2019   CREATININE 0.88 12/08/2019   BUN 17 12/08/2019   CO2 25 12/08/2019   TSH 0.42 06/04/2019   INR 1.05 07/16/2014   HGBA1C 7.3 (A) 12/17/2019   MICROALBUR 42.3 (H) 02/26/2019    No results found.  Assessment & Plan:   Theresa Harrington was seen today for hypertension, depression and urinary tract infection.  Diagnoses and all orders for this visit:  Essential hypertension, benign- Her BP is well controlled. -     Urine drugs of abuse scrn w alc, routine (Ref Lab); Future -     Urine drugs of abuse scrn w alc, routine (Ref Lab)  Type 2 diabetes mellitus with other circulatory complication, with long-term current use of insulin ([place])- Her BS's are well controlled. -     Glucagon (GVOKE HYPOPEN 2-PACK) 1 MG/0.2ML SOAJ; Inject 1 Act into the skin daily as needed.  Acute cystitis without hematuria- Will recheck a urine clx to be certain the infection has been eradicated. -     CULTURE, URINE COMPREHENSIVE; Future -     CULTURE, URINE COMPREHENSIVE  UTI due to  extended-spectrum beta lactamase (ESBL) producing Escherichia coli -     CULTURE, URINE COMPREHENSIVE; Future -     CULTURE, URINE COMPREHENSIVE  Current severe episode of major depressive disorder without psychotic features without prior episode (HCC) -     Vilazodone HCl (VIIBRYD STARTER PACK) 10 & 20 MG KIT; Take 1 tablet by mouth daily.   I have discontinued Caralee Below's nitrofurantoin (macrocrystal-monohydrate). I am also having her start on Gvoke HypoPen 2-Pack and [employer]. Additionally, I am having her maintain her Pen Needles, latanoprost, OneTouch Verio IQ System, dorzolamide-timolol, thiamine, insulin lispro, Basaglar KwikPen, potassium chloride SA, rivaroxaban, nebivolol, glucose blood, Ozempic (1 MG/DOSE), Cyanocobalamin (B-12 COMPLIANCE INJECTION IJ), cetirizine, Synjardy XR, omeprazole, magnesium oxide, ezetimibe, pravastatin, and losartan-hydrochlorothiazide.  Meds ordered this encounter  Medications  . Glucagon (GVOKE HYPOPEN 2-PACK) 1 MG/0.2ML SOAJ    Sig: Inject 1 Act into the skin daily as needed.    Dispense:  2 mL    Refill:  5  . Vilazodone HCl (VIIBRYD STARTER PACK) 10 & 20 MG KIT    Sig: Take 1 tablet by mouth daily.    Dispense:  1 kit    Refill:  0     Follow-up: Return in about 3 months (around 05/13/2020).  Scarlette Calico, MD

## 2020-02-11 NOTE — Progress Notes (Signed)
Spoke with patient in office regarding Synjardy refills through [employer]. Informed patient that per [facility], Iva Boop was refilled on 02/08/20 and should arrive 5-7 business days after the ship date. Pt voiced understanding.  Also, received message from [facility] that [facility] requires a PA. Per [facility] Baqsimi nasal spray is the preferred glucagon product (Tier 3) and GVoke is non-formulary.   Forwarding info to PCP.

## 2020-02-14 ENCOUNTER — Other Ambulatory Visit: Payer: Self-pay | Admitting: Internal Medicine

## 2020-02-14 DIAGNOSIS — B9629 Other Escherichia coli [E. coli] as the cause of diseases classified elsewhere: Secondary | ICD-10-CM

## 2020-02-14 DIAGNOSIS — Z1612 Extended spectrum beta lactamase (ESBL) resistance: Secondary | ICD-10-CM

## 2020-02-14 LAB — CULTURE, URINE COMPREHENSIVE

## 2020-02-14 MED ORDER — NITROFURANTOIN MONOHYD MACRO 100 MG PO CAPS: 100.0000 mg | ORAL_CAPSULE | Freq: Two times a day (BID) | ORAL | 0 refills | Status: AC

## 2020-02-16 ENCOUNTER — Telehealth: Payer: Self-pay | Admitting: Pharmacist

## 2020-02-16 ENCOUNTER — Telehealth: Payer: Self-pay | Admitting: Internal Medicine

## 2020-02-16 NOTE — Progress Notes (Addendum)
Chronic Care Management Pharmacy Assistant   Name: Jalise Zawistowski  MRN: [id number] DOB: 1955/07/12  Reason for Encounter: PAP Call  PCP : [name], MD  Allergies:   Allergies  Allergen Reactions  . Amlodipine Swelling  . Atorvastatin Other (See Comments)    Muscle aches   . Ampicillin Nausea And Vomiting and Other (See Comments)  . Codeine Nausea And Vomiting    Ask patient  . Lisinopril Cough       . Penicillins Nausea And Vomiting    Did it involve swelling of the face/tongue/throat, SOB, or low BP? No Did it involve sudden or severe rash/hives, skin peeling, or any reaction on the inside of your mouth or nose? No Did you need to seek medical attention at a hospital or doctor's office? No When did it last happen?20+ years If all above answers are "NO", may proceed with cephalosporin use.   [name] [Sitagliptin] Nausea And Vomiting    Medications: Outpatient Encounter Medications as of 02/16/2020  Medication Sig  . Blood Glucose Monitoring Suppl (ONETOUCH VERIO IQ SYSTEM) w/Device KIT USE TO CHECK SUGAR DAILY  . cetirizine (ZYRTEC) 10 MG tablet Take 10 mg by mouth daily as needed for allergies.  . Cyanocobalamin (B-12 COMPLIANCE INJECTION IJ) Inject 1,000 mcg as directed every 30 (thirty) days.  . dorzolamide-timolol (COSOPT) 22.3-6.8 MG/ML ophthalmic solution Place 1 drop into the right eye 2 (two) times daily.   . Empagliflozin-metFORMIN HCl ER (SYNJARDY XR) 12.08-998 MG TB24 Take 1 tablet by mouth daily.  [name] ezetimibe (ZETIA) 10 MG tablet TAKE ONE TABLET BY MOUTH EVERYDAY AT BEDTIME  . Glucagon (GVOKE HYPOPEN 2-PACK) 1 MG/0.2ML SOAJ Inject 1 Act into the skin daily as needed.  [name] glucose blood test strip Use to check blood sugar twice daily. DX: E11.8  . Insulin Glargine (BASAGLAR KWIKPEN) 100 UNIT/ML Inject 0.3 mLs (30 Units total) into the skin daily.  . insulin lispro (HUMALOG) 100 UNIT/ML injection Inject 14-20 Units into the skin 3 (three) times daily  before meals.   . Insulin Pen Needle (PEN NEEDLES) 32G X 4 MM MISC Inject 1 pen as directed 4 (four) times daily. Use to inject levemir or novolog as directed.  . latanoprost (XALATAN) 0.005 % ophthalmic solution Place 1 drop into both eyes at bedtime.   [name] losartan-hydrochlorothiazide (HYZAAR) 100-12.5 MG tablet TAKE ONE TABLET BY MOUTH EVERYDAY AT BEDTIME  . magnesium oxide (MAG-OX) 400 MG tablet TAKE ONE TABLET BY MOUTH EVERYDAY AT BEDTIME  . nebivolol (BYSTOLIC) 5 MG tablet Take 1 tablet (5 mg total) by mouth daily.  . nitrofurantoin, macrocrystal-monohydrate, (MACROBID) 100 MG capsule Take 1 capsule (100 mg total) by mouth 2 (two) times daily for 7 days.  [name] omeprazole (PRILOSEC) 40 MG capsule TAKE ONE CAPSULE BY MOUTH EVERY MORNING and TAKE ONE CAPSULE BY MOUTH EVERY EVENING  . potassium chloride SA (KLOR-CON) 20 MEQ tablet Take 1 tablet (20 mEq total) by mouth daily.  . pravastatin (PRAVACHOL) 40 MG tablet TAKE ONE TABLET BY MOUTH EVERYDAY AT BEDTIME  . rivaroxaban (XARELTO) 20 MG TABS tablet Take 1 tablet (20 mg total) by mouth daily.  . Semaglutide, 1 MG/DOSE, (OZEMPIC, 1 MG/DOSE,) 4 MG/3ML SOPN Inject 0.75 mLs (1 mg total) into the skin once a week. (Patient taking differently: Inject 1 mg into the skin every Friday. )  . thiamine 100 MG tablet Take 1 tablet (100 mg total) by mouth every other day.  . Vilazodone HCl (VIIBRYD STARTER PACK) 10 &  20 MG KIT Take 1 tablet by mouth daily.  . [DISCONTINUED] ezetimibe (ZETIA) 10 MG tablet Take 1 tablet (10 mg total) by mouth at bedtime.  . [DISCONTINUED] losartan-hydrochlorothiazide (HYZAAR) 100-12.5 MG tablet Take 1 tablet by mouth daily.  . [DISCONTINUED] magnesium oxide (MAG-OX) 400 MG tablet Take 1 tablet (400 mg total) by mouth at bedtime.  . [DISCONTINUED] omeprazole (PRILOSEC) 40 MG capsule Take 1 capsule (40 mg total) by mouth 2 (two) times daily before a meal.  . [DISCONTINUED] pravastatin (PRAVACHOL) 40 MG tablet Take 1 tablet (40 mg  total) by mouth daily.   No facility-administered encounter medications on file as of 02/16/2020.    Current Diagnosis: Patient Active Problem List   Diagnosis Date Noted  . UTI (urinary tract infection) 01/21/2020  . Lumbar strain, initial encounter 01/07/2020  . Amputated toe of left foot (HCC) 01/06/2020  . DM (diabetes mellitus) type II, controlled, with peripheral vascular disorder (HCC) 01/06/2020  . Hirsutism 01/06/2020  . Mitral valve stenosis   . Diabetes mellitus (HCC) 12/17/2019  . Atrial fibrillation (HCC)   . Pressure injury of right foot, stage 1 09/18/2019  . UTI due to extended-spectrum beta lactamase (ESBL) producing Escherichia coli 01/11/2019  . Gastroesophageal reflux disease with esophagitis 12/10/2018  . Barrett's esophagus with high grade dysplasia 11/06/2018  . Current use of long term anticoagulation 11/06/2018  . Diuretic-induced hypokalemia 10/08/2018  . Midline low back pain without sciatica 08/14/2018  . Thiamine deficiency 11/05/2017  . Atrial fibrillation with RVR (HCC) 11/08/2016  . Chronic idiopathic constipation 06/05/2016  . Morbid obesity due to excess calories (HCC) 08/04/2015  . Routine general medical examination at a health care facility 01/28/2015  . Visit for screening mammogram 01/27/2015  . Screening for cervical cancer 01/27/2015  . Kidney stone on right side 01/27/2015  . Atherosclerosis of native arteries of the extremities with ulceration (HCC) 11/14/2013  . Anemia, iron deficiency 10/28/2013  . Vitamin B12 deficiency neuropathy (HCC) 10/28/2013  . 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 10/09/2013  . GERD (gastroesophageal reflux disease) 08/06/2012  . Allergic rhinitis, cause unspecified 07/04/2012  . Low back pain radiating to both legs 07/04/2012  . GOITER, MULTINODULAR 11/04/2008  . Uncontrolled type 2 diabetes mellitus with peripheral artery disease (HCC) 08/05/2008  . Hyperlipidemia with target LDL  less than 100 08/05/2008  . Essential hypertension, benign 08/05/2008    Goals Addressed   None     Follow-Up:  Pharmacist Review   Synjardy PAP ([facility]:) Spoke with patient today who [facility] and stated that she has not received her Snyjardy from patient assistance. I called the patient and told her that she needed to call [facility] to ask for a refill. Patient stated that she called them about a month ago and never got an answer or call back. I told the patient that that I would call and look into the matter. The representative stated that the Synjardy was sent out today and that patient should receive it in 4-7 business days. I then called the patient back to let her know that she should be getting medication soon.  Bystolic PAP (my [employer] assist): Spoke with representative Susan about patient's Bystolic. Medication was approved until 04/12/21. The patient's next refill is on 04/12/20 and patient needs to call for that refill.   Deven Crosby, NRCMA  Practice Team Manager/ CPA (Clinical Pharmacist Assistant) [phone]  

## 2020-02-16 NOTE — Telephone Encounter (Signed)
Pt has been informed of results and expressed understanding.  °

## 2020-02-16 NOTE — Telephone Encounter (Signed)
Patient called and is requesting a call back about lab results from 02/11/2020.   Please call the patient back: [phone]

## 2020-02-16 NOTE — Progress Notes (Signed)
A user error has taken place: encounter opened in error, closed for administrative reasons.

## 2020-02-16 NOTE — Progress Notes (Signed)
Chronic Care Management Pharmacy Assistant   Name: Theresa Harrington  MRN: [id number] DOB: Apr 09, 1956  Reason for Encounter: Medication Review  PCP : [name], MD  Allergies:   Allergies  Allergen Reactions   Amlodipine Swelling   Atorvastatin Other (See Comments)    Muscle aches    Ampicillin Nausea And Vomiting and Other (See Comments)   Codeine Nausea And Vomiting    Ask patient   Lisinopril Cough        Penicillins Nausea And Vomiting    Did it involve swelling of the face/tongue/throat, SOB, or low BP? No Did it involve sudden or severe rash/hives, skin peeling, or any reaction on the inside of your mouth or nose? No Did you need to seek medical attention at a hospital or doctor's office? No When did it last happen?20+ years If all above answers are "NO", may proceed with cephalosporin use.    Januvia [Sitagliptin] Nausea And Vomiting    Medications: Outpatient Encounter Medications as of 02/16/2020  Medication Sig   Blood Glucose Monitoring Suppl (ONETOUCH VERIO IQ SYSTEM) w/Device KIT USE TO CHECK SUGAR DAILY   cetirizine (ZYRTEC) 10 MG tablet Take 10 mg by mouth daily as needed for allergies.   Cyanocobalamin (B-12 COMPLIANCE INJECTION IJ) Inject 1,000 mcg as directed every 30 (thirty) days.   dorzolamide-timolol (COSOPT) 22.3-6.8 MG/ML ophthalmic solution Place 1 drop into the right eye 2 (two) times daily.    Empagliflozin-metFORMIN HCl ER (SYNJARDY XR) 12.08-998 MG TB24 Take 1 tablet by mouth daily.   ezetimibe (ZETIA) 10 MG tablet TAKE ONE TABLET BY MOUTH EVERYDAY AT BEDTIME   Glucagon (GVOKE HYPOPEN 2-PACK) 1 MG/0.2ML SOAJ Inject 1 Act into the skin daily as needed.   glucose blood test strip Use to check blood sugar twice daily. DX: E11.8   Insulin Glargine (BASAGLAR KWIKPEN) 100 UNIT/ML Inject 0.3 mLs (30 Units total) into the skin daily.   insulin lispro (HUMALOG) 100 UNIT/ML injection Inject 14-20 Units into the skin 3 (three)  times daily before meals.    Insulin Pen Needle (PEN NEEDLES) 32G X 4 MM MISC Inject 1 pen as directed 4 (four) times daily. Use to inject levemir or novolog as directed.   latanoprost (XALATAN) 0.005 % ophthalmic solution Place 1 drop into both eyes at bedtime.    losartan-hydrochlorothiazide (HYZAAR) 100-12.5 MG tablet TAKE ONE TABLET BY MOUTH EVERYDAY AT BEDTIME   magnesium oxide (MAG-OX) 400 MG tablet TAKE ONE TABLET BY MOUTH EVERYDAY AT BEDTIME   nebivolol (BYSTOLIC) 5 MG tablet Take 1 tablet (5 mg total) by mouth daily.   nitrofurantoin, macrocrystal-monohydrate, (MACROBID) 100 MG capsule Take 1 capsule (100 mg total) by mouth 2 (two) times daily for 7 days.   omeprazole (PRILOSEC) 40 MG capsule TAKE ONE CAPSULE BY MOUTH EVERY MORNING and TAKE ONE CAPSULE BY MOUTH EVERY EVENING   potassium chloride SA (KLOR-CON) 20 MEQ tablet Take 1 tablet (20 mEq total) by mouth daily.   pravastatin (PRAVACHOL) 40 MG tablet TAKE ONE TABLET BY MOUTH EVERYDAY AT BEDTIME   rivaroxaban (XARELTO) 20 MG TABS tablet Take 1 tablet (20 mg total) by mouth daily.   Semaglutide, 1 MG/DOSE, (OZEMPIC, 1 MG/DOSE,) 4 MG/3ML SOPN Inject 0.75 mLs (1 mg total) into the skin once a week. (Patient taking differently: Inject 1 mg into the skin every Friday. )   thiamine 100 MG tablet Take 1 tablet (100 mg total) by mouth every other day.   Vilazodone HCl (VIIBRYD STARTER PACK) 10 &  20 MG KIT Take 1 tablet by mouth daily.   [DISCONTINUED] ezetimibe (ZETIA) 10 MG tablet Take 1 tablet (10 mg total) by mouth at bedtime.   [DISCONTINUED] losartan-hydrochlorothiazide (HYZAAR) 100-12.5 MG tablet Take 1 tablet by mouth daily.   [DISCONTINUED] magnesium oxide (MAG-OX) 400 MG tablet Take 1 tablet (400 mg total) by mouth at bedtime.   [DISCONTINUED] omeprazole (PRILOSEC) 40 MG capsule Take 1 capsule (40 mg total) by mouth 2 (two) times daily before a meal.   [DISCONTINUED] pravastatin (PRAVACHOL) 40 MG tablet Take 1 tablet  (40 mg total) by mouth daily.   No facility-administered encounter medications on file as of 02/16/2020.    Current Diagnosis: Patient Active Problem List   Diagnosis Date Noted   UTI (urinary tract infection) 01/21/2020   Lumbar strain, initial encounter 01/07/2020   Amputated toe of left foot ([place]) 01/06/2020   DM (diabetes mellitus) type II, controlled, with peripheral vascular disorder ([place]) 01/06/2020   Hirsutism 01/06/2020   Mitral valve stenosis    Diabetes mellitus ([place]) 12/17/2019   Atrial fibrillation (HCC)    Pressure injury of right foot, stage 1 09/18/2019   UTI due to extended-spectrum beta lactamase (ESBL) producing Escherichia coli 01/11/2019   Gastroesophageal reflux disease with esophagitis 12/10/2018   Barrett's esophagus with high grade dysplasia 11/06/2018   Current use of long term anticoagulation 11/06/2018   Diuretic-induced hypokalemia 10/08/2018   Midline low back pain without sciatica 08/14/2018   Thiamine deficiency 11/05/2017   Atrial fibrillation with RVR ([place]) 11/08/2016   Chronic idiopathic constipation 06/05/2016   Morbid obesity due to excess calories ([place]) 08/04/2015   Routine general medical examination at a health care facility 01/28/2015   Visit for screening mammogram 01/27/2015   Screening for cervical cancer 01/27/2015   Kidney stone on right side 01/27/2015   Atherosclerosis of native arteries of the extremities with ulceration ([place]) 11/14/2013   Anemia, iron deficiency 10/28/2013   Vitamin B12 deficiency neuropathy ([place]) 10/28/2013   History of laparoscopic adjustable gastric banding, 05/29/2005. 10/09/2013   GERD (gastroesophageal reflux disease) 08/06/2012   Allergic rhinitis, cause unspecified 07/04/2012   Low back pain radiating to both legs 07/04/2012   GOITER, MULTINODULAR 11/04/2008   Uncontrolled type 2 diabetes mellitus with peripheral artery disease ([place]) 08/05/2008   Hyperlipidemia with  target LDL less than 100 08/05/2008   Essential hypertension, benign 08/05/2008    Goals Addressed   None     Follow-Up:  Coordination of [facility] Pharmacy Services    Received call from patient regarding medication management via [facility].  Patient requested an acute fill for One touch Verio Test strips to be delivered: 02/16/20 Pharmacy needs refills? No  Confirmed delivery date of 02/16/20, advised patient that pharmacy will contact them the morning of delivery.  Rosendo Gros, [facility]  Practice Team Manager/ CPA (Clinical Pharmacist Assistant) [phone]

## 2020-02-20 ENCOUNTER — Telehealth: Payer: Self-pay

## 2020-02-20 LAB — URINE DRUGS OF ABUSE SCREEN W ALC, ROUTINE (REF LAB)
Amphetamines, Urine: NEGATIVE ng/mL
Barbiturate Quant, Ur: NEGATIVE ng/mL
Benzodiazepine Quant, Ur: NEGATIVE ng/mL
Cannabinoid Quant, Ur: NEGATIVE ng/mL
Cocaine (Metab.): NEGATIVE ng/mL
Methadone Screen, Urine: NEGATIVE ng/mL
Opiate Quant, Ur: NEGATIVE ng/mL
PCP Quant, Ur: NEGATIVE ng/mL
Propoxyphene: NEGATIVE ng/mL

## 2020-02-20 LAB — ETHANOL CONFIRM, URINE
Ethanol, Ur - Confirmation: 0.021 %
Ethanol, Urine: POSITIVE — AB

## 2020-02-20 NOTE — Telephone Encounter (Signed)
BYSTOLIC  Called pt, LVM stating medication is ready for pick up.  Located at front desk.

## 2020-02-21 ENCOUNTER — Encounter: Payer: Self-pay | Admitting: Internal Medicine

## 2020-02-23 ENCOUNTER — Telehealth: Payer: Self-pay | Admitting: Internal Medicine

## 2020-02-23 NOTE — Telephone Encounter (Signed)
Patient states she saw her results for her alcohol levels and wasn't sure why they came back positive because she doesn't drink and wants to know what could cause it.  Patient # [phone]

## 2020-02-23 NOTE — Telephone Encounter (Signed)
Called pt, LVM.   

## 2020-02-25 DIAGNOSIS — L03115 Cellulitis of right lower limb: Secondary | ICD-10-CM | POA: Diagnosis not present

## 2020-02-25 DIAGNOSIS — L97512 Non-pressure chronic ulcer of other part of right foot with fat layer exposed: Secondary | ICD-10-CM | POA: Diagnosis not present

## 2020-03-01 ENCOUNTER — Other Ambulatory Visit: Payer: Self-pay

## 2020-03-01 ENCOUNTER — Other Ambulatory Visit (HOSPITAL_COMMUNITY): Payer: Self-pay | Admitting: Surgery

## 2020-03-01 ENCOUNTER — Encounter: Payer: Self-pay | Admitting: Surgery

## 2020-03-01 ENCOUNTER — Ambulatory Visit (INDEPENDENT_AMBULATORY_CARE_PROVIDER_SITE_OTHER)
Admission: RE | Admit: 2020-03-01 | Discharge: 2020-03-01 | Disposition: A | Discharge status: Home / Self Care | Payer: PPO | Source: Ambulatory Visit | Attending: Surgery | Admitting: Surgery

## 2020-03-01 ENCOUNTER — Ambulatory Visit: Payer: PPO | Admitting: Surgery

## 2020-03-01 ENCOUNTER — Telehealth: Payer: Self-pay | Admitting: Cardiology

## 2020-03-01 ENCOUNTER — Ambulatory Visit (HOSPITAL_COMMUNITY)
Admission: RE | Admit: 2020-03-01 | Discharge: 2020-03-01 | Disposition: A | Discharge status: Home / Self Care | Payer: PPO | Source: Ambulatory Visit | Attending: Surgery | Admitting: Surgery

## 2020-03-01 VITALS — BP 148/80 | HR 60 | Temp 98.2°F | Resp 20 | Ht 65.0 in | Wt 173.9 lb

## 2020-03-01 DIAGNOSIS — I7025 Atherosclerosis of native arteries of other extremities with ulceration: Secondary | ICD-10-CM | POA: Diagnosis not present

## 2020-03-01 DIAGNOSIS — I70213 Atherosclerosis of native arteries of extremities with intermittent claudication, bilateral legs: Secondary | ICD-10-CM

## 2020-03-01 DIAGNOSIS — I771 Stricture of artery: Secondary | ICD-10-CM

## 2020-03-01 NOTE — Telephone Encounter (Signed)
° °  [facility] Pre-operative Risk Assessment    HEARTCARE STAFF: - Please ensure there is not already an duplicate clearance open for this procedure. - Under Visit Info/Reason for Call, type in Other and utilize the format Clearance MM/DD/YY or Clearance TBD. Do not use dashes or single digits. - If request is for dental extraction, please clarify the # of teeth to be extracted.  Request for surgical clearance:  1. What type of surgery is being performed? Right femoral popiteal bypass and abdominal aortogram    2. When is this surgery scheduled? 03/09/20 and 03/10/20  3. What type of clearance is required (medical clearance vs. Pharmacy clearance to hold med vs. Both)? medical  4. Are there any medications that need to be held prior to surgery and how long? n/a  5. Practice name and name of physician performing surgery? [facility]  6. What is the office phone number? [phone]   7.   What is the office fax number? [phone]  8.   Anesthesia type (None, local, MAC, general) ? Theresa Harrington 03/01/2020, 2:09 PM  _________________________________________________________________   (provider comments below)

## 2020-03-01 NOTE — H&P (View-Only) (Signed)
 [facility] [place]  Patient name: Theresa Harrington MRN: [id number] DOB: 06/09/1955 Sex: female   REASON FOR VISIT:    Follow up  HISOTRY OF PRESENT ILLNESS:    Theresa Harrington is a 64 y.o. female who is back for follow-up.  She initially underwent a left femoral-popliteal bypass graft by Dr. Early on 06/08/2014.  Unfortunately, this occluded early.  I took her back to the operating room on 06/09/2014 and we did her bypass graft with Gore-Tex.  I also performed a endarterectomy of the left anterior tibial artery.  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  Her bypass graft is known to be occluded.  She is here today with a new wound on the bottom of her right foot which has been present for approximately 1 month.  She continues to struggle with control of her diabetes.  Her last A1c was 7.5.  She remains on statin therapy.  She is anticoagulated for atrial fibrillation  PAST MEDICAL HISTORY:   Past Medical History:  Diagnosis Date  . Allergy   . Anxiety    Pt. denies  . Arthritis    right index figer  . Asymptomatic cholelithiasis   . Atherosclerosis of aorta (HCC)   . Cataract   . Clotting disorder (HCC)   . Gallstones 06/2013  . GERD (gastroesophageal reflux disease)   . History of kidney stones    lithotrispy  . Hyperlipidemia   . Hypertension   . Hypothyroidism    no meds  . Iron deficiency anemia   . PAD (peripheral artery disease) (HCC)   . PAF (paroxysmal atrial fibrillation) (HCC)   . Peripheral arterial occlusive disease (HCC)    lower extremities  . Pneumonia   . PONV (postoperative nausea and vomiting)   . Right ureteral stone   . Type 2 diabetes mellitus (HCC)    Type  . Vitamin B 12 deficiency   . Wears glasses      FAMILY HISTORY:   Family History  Problem Relation Age of Onset  . Diabetes Father   . Heart disease Father   . Deep vein thrombosis Father   .  Hyperlipidemia Father   . Diabetes Sister   . Heart disease Sister   . Deep vein thrombosis Sister   . Hyperlipidemia Sister   . Diabetes Brother   . Heart disease Brother   . Cancer Brother   . Hyperlipidemia Brother   . Colon cancer Maternal Aunt   . Diabetes Brother   . Diabetes Brother   . Kidney disease Mother   . Hyperlipidemia Mother   . Other Mother        AAA   and    Amputation  . Arthritis Other   . Cancer Other        colon  . Hypertension Other   . Stroke Other   . Esophageal cancer Neg Hx   . Stomach cancer Neg Hx   . Inflammatory bowel disease Neg Hx   . Liver disease Neg Hx   . Pancreatic cancer Neg Hx     SOCIAL HISTORY:   Social History   Tobacco Use  . Smoking status: Former Smoker    Packs/day: 1.00    Years: 28.00    Pack years: 28.00    Types: Cigarettes    Quit date: 09/05/2001    Years since quitting: 18.4  . Smokeless tobacco: Never Used  Substance Use Topics  .   Alcohol use: No    Alcohol/week: 0.0 standard drinks     ALLERGIES:   Allergies  Allergen Reactions  . Amlodipine Swelling  . Atorvastatin Other (See Comments)    Muscle aches   . Ampicillin Nausea And Vomiting and Other (See Comments)  . Codeine Nausea And Vomiting    Ask patient  . Lisinopril Cough       . Penicillins Nausea And Vomiting    Did it involve swelling of the face/tongue/throat, SOB, or low BP? No Did it involve sudden or severe rash/hives, skin peeling, or any reaction on the inside of your mouth or nose? No Did you need to seek medical attention at a hospital or doctor's office? No When did it last happen?20+ years If all above answers are "NO", may proceed with cephalosporin use.   . Januvia [Sitagliptin] Nausea And Vomiting     CURRENT MEDICATIONS:   Current Outpatient Medications  Medication Sig Dispense Refill  . Blood Glucose Monitoring Suppl (ONETOUCH VERIO IQ SYSTEM) w/Device KIT USE TO CHECK SUGAR DAILY 1 kit 0  . cetirizine  (ZYRTEC) 10 MG tablet Take 10 mg by mouth daily as needed for allergies.    . clindamycin (CLEOCIN) 300 MG capsule Take 300 mg by mouth 2 (two) times daily.    . Cyanocobalamin (B-12 COMPLIANCE INJECTION IJ) Inject 1,000 mcg as directed every 30 (thirty) days.    . dorzolamide-timolol (COSOPT) 22.3-6.8 MG/ML ophthalmic solution Place 1 drop into the right eye 2 (two) times daily.     . Empagliflozin-metFORMIN HCl ER (SYNJARDY XR) 12.08-998 MG TB24 Take 1 tablet by mouth daily. 90 tablet 1  . ezetimibe (ZETIA) 10 MG tablet TAKE ONE TABLET BY MOUTH EVERYDAY AT BEDTIME 90 tablet 1  . Glucagon (GVOKE HYPOPEN 2-PACK) 1 MG/0.2ML SOAJ Inject 1 Act into the skin daily as needed. 2 mL 5  . glucose blood test strip Use to check blood sugar twice daily. DX: E11.8 200 each 3  . Insulin Glargine (BASAGLAR KWIKPEN) 100 UNIT/ML Inject 0.3 mLs (30 Units total) into the skin daily. 15 mL 1  . insulin lispro (HUMALOG) 100 UNIT/ML injection Inject 14-20 Units into the skin 3 (three) times daily before meals.     . Insulin Pen Needle (PEN NEEDLES) 32G X 4 MM MISC Inject 1 pen as directed 4 (four) times daily. Use to inject levemir or novolog as directed. 400 each 3  . latanoprost (XALATAN) 0.005 % ophthalmic solution Place 1 drop into both eyes at bedtime.     . losartan-hydrochlorothiazide (HYZAAR) 100-12.5 MG tablet TAKE ONE TABLET BY MOUTH EVERYDAY AT BEDTIME 90 tablet 1  . magnesium oxide (MAG-OX) 400 MG tablet TAKE ONE TABLET BY MOUTH EVERYDAY AT BEDTIME 90 tablet 1  . mupirocin ointment (BACTROBAN) 2 % SMARTSIG:1 Application Topical 2-3 Times Daily    . nebivolol (BYSTOLIC) 5 MG tablet Take 1 tablet (5 mg total) by mouth daily. 90 tablet 1  . omeprazole (PRILOSEC) 40 MG capsule TAKE ONE CAPSULE BY MOUTH EVERY MORNING and TAKE ONE CAPSULE BY MOUTH EVERY EVENING 180 capsule 1  . potassium chloride SA (KLOR-CON) 20 MEQ tablet Take 1 tablet (20 mEq total) by mouth daily. 90 tablet 1  . pravastatin (PRAVACHOL) 40 MG  tablet TAKE ONE TABLET BY MOUTH EVERYDAY AT BEDTIME 90 tablet 1  . rivaroxaban (XARELTO) 20 MG TABS tablet Take 1 tablet (20 mg total) by mouth daily. 90 tablet 1  . Semaglutide, 1 MG/DOSE, (OZEMPIC, 1 MG/DOSE,) 4 MG/3ML SOPN   Inject 0.75 mLs (1 mg total) into the skin once a week. (Patient taking differently: Inject 1 mg into the skin every Friday. ) 9 mL 1  . thiamine 100 MG tablet Take 1 tablet (100 mg total) by mouth every other day. 45 tablet 1  . Vilazodone HCl (VIIBRYD STARTER PACK) 10 & 20 MG KIT Take 1 tablet by mouth daily. 1 kit 0   No current facility-administered medications for this visit.    REVIEW OF SYSTEMS:   [X] denotes positive finding, [ ] denotes negative finding Cardiac  Comments:  Chest pain or chest pressure:    Shortness of breath upon exertion:    Short of breath when lying flat:    Irregular heart rhythm:        Vascular    Pain in calf, thigh, or hip brought on by ambulation:    Pain in feet at night that wakes you up from your sleep:     Blood clot in your veins:    Leg swelling:         Pulmonary    Oxygen at home:    Productive cough:     Wheezing:         Neurologic    Sudden weakness in arms or legs:     Sudden numbness in arms or legs:     Sudden onset of difficulty speaking or slurred speech:    Temporary loss of vision in one eye:     Problems with dizziness:         Gastrointestinal    Blood in stool:     Vomited blood:         Genitourinary    Burning when urinating:     Blood in urine:        Psychiatric    Major depression:         Hematologic    Bleeding problems:    Problems with blood clotting too easily:        Skin    Rashes or ulcers: x       Constitutional    Fever or chills:      PHYSICAL EXAM:   Vitals:   03/01/20 1110  BP: (!) 148/80  Pulse: 60  Resp: 20  Temp: 98.2 F (36.8 C)  SpO2: 98%  Weight: 173 lb 14.4 oz (78.9 kg)  Height: 5' 5" (1.651 m)    GENERAL: The patient is a well-nourished  female, in no acute distress. The vital signs are documented above. CARDIAC: There is a regular rate and rhythm.  VASCULAR: Pedal pulses are nonpalpable PULMONARY: Non-labored respirations ABDOMEN: Soft and non-tender with normal pitched bowel sounds.  MUSCULOSKELETAL: There are no major deformities or cyanosis. NEUROLOGIC: No focal weakness or paresthesias are detected. SKIN: Early superficial ulceration on the ball of the right great toe PSYCHIATRIC: The patient has a normal affect.  STUDIES:   I have reviewed the following studies: Right: CFA 75-99% stenosis.  DFA 30-49% stenosis.  Multiple occluded segments throughout the SFA in the proximal, mid to  distal, and distal segments.  Occluded proximal popliteal.   Left: CFA 50-74% stenosis.  Known occluded Fem Pop bypass graft.  Occluded Peroneal.    +-------+-----------+-----------+------------+------------+  ABI/TBIToday's ABIToday's TBIPrevious ABIPrevious TBI  +-------+-----------+-----------+------------+------------+  Right 0.75    0.3    0.61    0.41      +-------+-----------+-----------+------------+------------+  Left  0.76    0.46    0.69    0.45      +-------+-----------+-----------+------------+------------+  Right toe pressure equals 54 Left toe pressure equals 83 MEDICAL ISSUES I discussed   Atherosclerotic lower extremity vascular disease with new ulcer on the right toe: I discussed proceeding with angiography to redefine her anatomy since it has been many years since she has had angiography performed.  In 2016 she had calcific common femoral disease on the right and occluded superficial femoral artery.  I suspect that she will need surgical revascularization.  I have scheduled her arteriogram for Tuesday, November 2 and surgery the following day.  She will need to be off of her Xarelto.  I will also get approval to proceed from Dr. Skains, her  cardiologist    Wells Izzak Fries, IV, MD, FACS [facility] [place] Tel [phone] Pager [phone] 

## 2020-03-01 NOTE — Progress Notes (Signed)
[facility]  Patient name: Theresa Harrington MRN: [id number] DOB: 1955-07-06 Sex: female   REASON FOR VISIT:    Follow up  HISOTRY OF PRESENT ILLNESS:    Theresa Harrington is a 64 y.o. female who is back for follow-up.  She initially underwent a left femoral-popliteal bypass graft by Dr. Donnetta Hutching on 06/08/2014.  Unfortunately, this occluded early.  I took her back to the operating room on 06/09/2014 and we did her bypass graft with Gore-Tex.  I also performed a endarterectomy of the left anterior tibial artery.  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  Her bypass graft is known to be occluded.  She is here today with a new wound on the bottom of her right foot which has been present for approximately 1 month.  She continues to struggle with control of her diabetes.  Her last A1c was 7.5.  She remains on statin therapy.  She is anticoagulated for atrial fibrillation  PAST MEDICAL HISTORY:   Past Medical History:  Diagnosis Date  . Allergy   . Anxiety    Pt. denies  . Arthritis    right index figer  . Asymptomatic cholelithiasis   . Atherosclerosis of aorta ([place])   . Cataract   . Clotting disorder ([place])   . Gallstones 06/2013  . GERD (gastroesophageal reflux disease)   . History of kidney stones    lithotrispy  . Hyperlipidemia   . Hypertension   . Hypothyroidism    no meds  . Iron deficiency anemia   . PAD (peripheral artery disease) ([place])   . PAF (paroxysmal atrial fibrillation) ([place])   . Peripheral arterial occlusive disease (HCC)    lower extremities  . Pneumonia   . PONV (postoperative nausea and vomiting)   . Right ureteral stone   . Type 2 diabetes mellitus (HCC)    Type  . Vitamin B 12 deficiency   . Wears glasses      FAMILY HISTORY:   Family History  Problem Relation Age of Onset  . Diabetes Father   . Heart disease Father   . Deep vein thrombosis Father   .  Hyperlipidemia Father   . Diabetes Sister   . Heart disease Sister   . Deep vein thrombosis Sister   . Hyperlipidemia Sister   . Diabetes Brother   . Heart disease Brother   . Cancer Brother   . Hyperlipidemia Brother   . Colon cancer Maternal Aunt   . Diabetes Brother   . Diabetes Brother   . Kidney disease Mother   . Hyperlipidemia Mother   . Other Mother        AAA   and    Amputation  . Arthritis Other   . Cancer Other        colon  . Hypertension Other   . Stroke Other   . Esophageal cancer Neg Hx   . Stomach cancer Neg Hx   . Inflammatory bowel disease Neg Hx   . Liver disease Neg Hx   . Pancreatic cancer Neg Hx     SOCIAL HISTORY:   Social History   Tobacco Use  . Smoking status: Former Smoker    Packs/day: 1.00    Years: 28.00    Pack years: 28.00    Types: Cigarettes    Quit date: 09/05/2001    Years since quitting: 18.4  . Smokeless tobacco: Never Used  Substance Use Topics  .  Alcohol use: No    Alcohol/week: 0.0 standard drinks     ALLERGIES:   Allergies  Allergen Reactions  . Amlodipine Swelling  . Atorvastatin Other (See Comments)    Muscle aches   . Ampicillin Nausea And Vomiting and Other (See Comments)  . Codeine Nausea And Vomiting    Ask patient  . Lisinopril Cough       . Penicillins Nausea And Vomiting    Did it involve swelling of the face/tongue/throat, SOB, or low BP? No Did it involve sudden or severe rash/hives, skin peeling, or any reaction on the inside of your mouth or nose? No Did you need to seek medical attention at a hospital or doctor's office? No When did it last happen?20+ years If all above answers are "NO", may proceed with cephalosporin use.   [name] [Sitagliptin] Nausea And Vomiting     CURRENT MEDICATIONS:   Current Outpatient Medications  Medication Sig Dispense Refill  . Blood Glucose Monitoring Suppl (ONETOUCH VERIO IQ SYSTEM) w/Device KIT USE TO CHECK SUGAR DAILY 1 kit 0  . cetirizine  (ZYRTEC) 10 MG tablet Take 10 mg by mouth daily as needed for allergies.    . clindamycin (CLEOCIN) 300 MG capsule Take 300 mg by mouth 2 (two) times daily.    . Cyanocobalamin (B-12 COMPLIANCE INJECTION IJ) Inject 1,000 mcg as directed every 30 (thirty) days.    . dorzolamide-timolol (COSOPT) 22.3-6.8 MG/ML ophthalmic solution Place 1 drop into the right eye 2 (two) times daily.     . Empagliflozin-metFORMIN HCl ER (SYNJARDY XR) 12.08-998 MG TB24 Take 1 tablet by mouth daily. 90 tablet 1  . ezetimibe (ZETIA) 10 MG tablet TAKE ONE TABLET BY MOUTH EVERYDAY AT BEDTIME 90 tablet 1  . Glucagon (GVOKE HYPOPEN 2-PACK) 1 MG/0.2ML SOAJ Inject 1 Act into the skin daily as needed. 2 mL 5  . glucose blood test strip Use to check blood sugar twice daily. DX: E11.8 200 each 3  . Insulin Glargine (BASAGLAR KWIKPEN) 100 UNIT/ML Inject 0.3 mLs (30 Units total) into the skin daily. 15 mL 1  . insulin lispro (HUMALOG) 100 UNIT/ML injection Inject 14-20 Units into the skin 3 (three) times daily before meals.     . Insulin Pen Needle (PEN NEEDLES) 32G X 4 MM MISC Inject 1 pen as directed 4 (four) times daily. Use to inject levemir or novolog as directed. 400 each 3  . latanoprost (XALATAN) 0.005 % ophthalmic solution Place 1 drop into both eyes at bedtime.     [name] losartan-hydrochlorothiazide (HYZAAR) 100-12.5 MG tablet TAKE ONE TABLET BY MOUTH EVERYDAY AT BEDTIME 90 tablet 1  . magnesium oxide (MAG-OX) 400 MG tablet TAKE ONE TABLET BY MOUTH EVERYDAY AT BEDTIME 90 tablet 1  . mupirocin ointment (BACTROBAN) 2 % SMARTSIG:1 Application Topical 2-3 Times Daily    . nebivolol (BYSTOLIC) 5 MG tablet Take 1 tablet (5 mg total) by mouth daily. 90 tablet 1  . omeprazole (PRILOSEC) 40 MG capsule TAKE ONE CAPSULE BY MOUTH EVERY MORNING and TAKE ONE CAPSULE BY MOUTH EVERY EVENING 180 capsule 1  . potassium chloride SA (KLOR-CON) 20 MEQ tablet Take 1 tablet (20 mEq total) by mouth daily. 90 tablet 1  . pravastatin (PRAVACHOL) 40 MG  tablet TAKE ONE TABLET BY MOUTH EVERYDAY AT BEDTIME 90 tablet 1  . rivaroxaban (XARELTO) 20 MG TABS tablet Take 1 tablet (20 mg total) by mouth daily. 90 tablet 1  . Semaglutide, 1 MG/DOSE, (OZEMPIC, 1 MG/DOSE,) 4 MG/3ML SOPN  Inject 0.75 mLs (1 mg total) into the skin once a week. (Patient taking differently: Inject 1 mg into the skin every Friday. ) 9 mL 1  . thiamine 100 MG tablet Take 1 tablet (100 mg total) by mouth every other day. 45 tablet 1  . Vilazodone HCl (VIIBRYD STARTER PACK) 10 & 20 MG KIT Take 1 tablet by mouth daily. 1 kit 0   No current facility-administered medications for this visit.    REVIEW OF SYSTEMS:   [X] denotes positive finding, [ ] denotes negative finding Cardiac  Comments:  Chest pain or chest pressure:    Shortness of breath upon exertion:    Short of breath when lying flat:    Irregular heart rhythm:        Vascular    Pain in calf, thigh, or hip brought on by ambulation:    Pain in feet at night that wakes you up from your sleep:     Blood clot in your veins:    Leg swelling:         Pulmonary    Oxygen at home:    Productive cough:     Wheezing:         Neurologic    Sudden weakness in arms or legs:     Sudden numbness in arms or legs:     Sudden onset of difficulty speaking or slurred speech:    Temporary loss of vision in one eye:     Problems with dizziness:         Gastrointestinal    Blood in stool:     Vomited blood:         Genitourinary    Burning when urinating:     Blood in urine:        Psychiatric    Major depression:         Hematologic    Bleeding problems:    Problems with blood clotting too easily:        Skin    Rashes or ulcers: x       Constitutional    Fever or chills:      PHYSICAL EXAM:   Vitals:   03/01/20 1110  BP: (!) 148/80  Pulse: 60  Resp: 20  Temp: 98.2 F (36.8 C)  SpO2: 98%  Weight: 173 lb 14.4 oz (78.9 kg)  Height: 5' 5" (1.651 m)    GENERAL: The patient is a well-nourished  female, in no acute distress. The vital signs are documented above. CARDIAC: There is a regular rate and rhythm.  VASCULAR: Pedal pulses are nonpalpable PULMONARY: Non-labored respirations ABDOMEN: Soft and non-tender with normal pitched bowel sounds.  MUSCULOSKELETAL: There are no major deformities or cyanosis. NEUROLOGIC: No focal weakness or paresthesias are detected. SKIN: Early superficial ulceration on the ball of the right great toe PSYCHIATRIC: The patient has a normal affect.  STUDIES:   I have reviewed the following studies: Right: CFA 75-99% stenosis.  DFA 30-49% stenosis.  Multiple occluded segments throughout the SFA in the proximal, mid to  distal, and distal segments.  Occluded proximal popliteal.   Left: CFA 50-74% stenosis.  Known occluded Fem Pop bypass graft.  Occluded Peroneal.    +-------+-----------+-----------+------------+------------+  ABI/TBIToday's ABIToday's TBIPrevious ABIPrevious TBI  +-------+-----------+-----------+------------+------------+  Right 0.75    0.3    0.61    0.41      +-------+-----------+-----------+------------+------------+  Left  0.76    0.46    0.69    0.45      +-------+-----------+-----------+------------+------------+  Right toe pressure equals 54 Left toe pressure equals 83 MEDICAL ISSUES I discussed   Atherosclerotic lower extremity vascular disease with new ulcer on the right toe: I discussed proceeding with angiography to redefine her anatomy since it has been many years since she has had angiography performed.  In 2016 she had calcific common femoral disease on the right and occluded superficial femoral artery.  I suspect that she will need surgical revascularization.  I have scheduled her arteriogram for Tuesday, November 2 and surgery the following day.  She will need to be off of her Xarelto.  I will also get approval to proceed from Dr. Marlou Porch, her  cardiologist    Leia Alf, MD, FACS [facility] [phone] Pager [phone]

## 2020-03-02 ENCOUNTER — Other Ambulatory Visit: Payer: Self-pay

## 2020-03-02 NOTE — Telephone Encounter (Signed)
Theresa Harrington is a 64 yo with PMH of PAF on Xarelto, PAD s/p fempop bypass 06/08/2014, HTN, DM and mitral valve stenosis. Recent TEE obtained on 12/24/2019 showed EF 60-65%, normal RV function, moderate LAE, fixed posterior leaflet of mitral valve, no significant MR, very mild MS.   Talking with the patient, she is not very active, but also denies any recent chest pain or worsening shortness with exertion. She does not have a h/o CAD.   Dr. Marlou Porch to review and see if patient need further workup prior to the procedure?   Please send your reply to P [facility] PREOP

## 2020-03-02 NOTE — Telephone Encounter (Signed)
Clinical pharmacist to review Xarelto, per patient he was told to hold it for 3 days prior to the bypass procedure.

## 2020-03-03 NOTE — Telephone Encounter (Signed)
Patient with diagnosis of A Fib on Xarelto for anticoagulation.    Procedure: Right femoral popiteal bypass and abdominal aortogram    Date of procedure: 03/09/20 and 03/10/20  CHA2DS2-VASc Score = 4  This indicates a 4.8% annual risk of stroke. The patient's score is based upon: CHF History: 0 HTN History: 1 Diabetes History: 1 Stroke History: 0 Vascular Disease History: 1 Age Score: 0 Gender Score: 1  CrCl 63mL/min using adjusted body weight Platelet count 247K  Per office protocol, patient can hold Xarelto 2-3 days prior to procedure.    Patient will not need bridging with Lovenox (enoxaparin) around procedure.  If not bridging, patient should restart Xarelto on the evening of procedure or day after, at discretion of procedure MD

## 2020-03-04 DIAGNOSIS — L03115 Cellulitis of right lower limb: Secondary | ICD-10-CM | POA: Diagnosis not present

## 2020-03-04 DIAGNOSIS — E1151 Type 2 diabetes mellitus with diabetic peripheral angiopathy without gangrene: Secondary | ICD-10-CM | POA: Diagnosis not present

## 2020-03-04 DIAGNOSIS — L97512 Non-pressure chronic ulcer of other part of right foot with fat layer exposed: Secondary | ICD-10-CM | POA: Diagnosis not present

## 2020-03-04 NOTE — Telephone Encounter (Signed)
She may hold her Xarelto for 2 days prior to angiogram (3 if felt necessary by Dr. Trula Slade).  She may proceed with angiogram and FEM POP bypass.  [name], MD

## 2020-03-04 NOTE — Telephone Encounter (Signed)
I have forwarded the cardiac clearance to the surgeon's office. Callback pool, please check with the surgeon's office to see if they want the patient to hold Xarelto for 2 or 3 days (per Dr. Marlou Porch, usually 2 days, but if Dr. Trula Slade want, can hold 3 days as well) and inform the patient.

## 2020-03-05 NOTE — Telephone Encounter (Signed)
Call and spoke with Dr Trula Slade office, spoke with nurse(Alexis) stated that pt need to hold Xarelto starting Sunday night if pt take it in the morning and resume Thursday, call and spoke with pt, she stated that she does take Xarelto in the morning, advised pt to take xarelto Sunday morning and hold until Thursday, pt voice understanding.

## 2020-03-08 ENCOUNTER — Other Ambulatory Visit: Payer: Self-pay

## 2020-03-08 ENCOUNTER — Other Ambulatory Visit (HOSPITAL_COMMUNITY)
Admission: RE | Admit: 2020-03-08 | Discharge: 2020-03-08 | Disposition: A | Discharge status: Home / Self Care | Payer: PPO | Source: Ambulatory Visit | Attending: Surgery | Admitting: Surgery

## 2020-03-08 ENCOUNTER — Telehealth: Payer: Self-pay

## 2020-03-08 ENCOUNTER — Encounter (HOSPITAL_COMMUNITY)
Admission: RE | Admit: 2020-03-08 | Discharge: 2020-03-08 | Disposition: A | Discharge status: Home / Self Care | Payer: PPO | Source: Ambulatory Visit | Attending: Surgery | Admitting: Surgery

## 2020-03-08 ENCOUNTER — Encounter (HOSPITAL_COMMUNITY): Payer: Self-pay

## 2020-03-08 DIAGNOSIS — Z01818 Encounter for other preprocedural examination: Secondary | ICD-10-CM | POA: Insufficient documentation

## 2020-03-08 DIAGNOSIS — Z20822 Contact with and (suspected) exposure to covid-19: Secondary | ICD-10-CM | POA: Diagnosis not present

## 2020-03-08 LAB — COMPREHENSIVE METABOLIC PANEL
ALT: 13 U/L (ref 0–44)
AST: 14 U/L — ABNORMAL LOW (ref 15–41)
Albumin: 3.8 g/dL (ref 3.5–5.0)
Alkaline Phosphatase: 59 U/L (ref 38–126)
Anion gap: 11 (ref 5–15)
BUN: 10 mg/dL (ref 8–23)
CO2: 28 mmol/L (ref 22–32)
Calcium: 9.7 mg/dL (ref 8.9–10.3)
Chloride: 99 mmol/L (ref 98–111)
Creatinine, Ser: 0.78 mg/dL (ref 0.44–1.00)
GFR, Estimated: >60 mL/min (ref >60)
Glucose, Bld: 114 mg/dL — ABNORMAL HIGH (ref 70–99)
Potassium: 4.2 mmol/L (ref 3.5–5.1)
Sodium: 138 mmol/L (ref 135–145)
Total Bilirubin: 0.7 mg/dL (ref 0.3–1.2)
Total Protein: 7.3 g/dL (ref 6.5–8.1)

## 2020-03-08 LAB — CBC
HCT: 45.1 % (ref 36.0–46.0)
Hemoglobin: 14.4 g/dL (ref 12.0–15.0)
MCH: 29.1 pg (ref 26.0–34.0)
MCHC: 31.9 g/dL (ref 30.0–36.0)
MCV: 91.3 fL (ref 80.0–100.0)
Platelets: 278 10*3/uL (ref 150–400)
RBC: 4.94 MIL/uL (ref 3.87–5.11)
RDW: 12.6 % (ref 11.5–15.5)
WBC: 8.4 10*3/uL (ref 4.0–10.5)
nRBC: 0 % (ref 0.0–0.2)

## 2020-03-08 LAB — TYPE AND SCREEN
ABO/RH(D): A POS
Antibody Screen: NEGATIVE

## 2020-03-08 LAB — APTT: aPTT: 25 seconds (ref 24–36)

## 2020-03-08 LAB — SURGICAL PCR SCREEN
MRSA, PCR: NEGATIVE
Staphylococcus aureus: NEGATIVE

## 2020-03-08 LAB — PROTIME-INR
INR: 1.3 — ABNORMAL HIGH (ref 0.8–1.2)
Prothrombin Time: 16 seconds — ABNORMAL HIGH (ref 11.4–15.2)

## 2020-03-08 LAB — SARS CORONAVIRUS 2 (TAT 6-24 HRS): SARS Coronavirus 2: NEGATIVE

## 2020-03-08 LAB — GLUCOSE, CAPILLARY: Glucose-Capillary: 93 mg/dL (ref 70–99)

## 2020-03-08 NOTE — Telephone Encounter (Signed)
Recall entered to call pt at the beginning of the year.

## 2020-03-08 NOTE — Pre-Procedure Instructions (Signed)
[address] [facility], [place] [address] Phone: [phone] Fax: [phone]     Your procedure is scheduled on Tuesday November 9th.  Report to [facility] Main Entrance "A" at 7:00 A.M., and check in at the Admitting office.  Call this number if you have problems the morning of surgery:  [phone]  Call [phone] if you have any questions prior to your surgery date Monday-Friday 8am-4pm    Remember:  Do not eat after midnight the night before your surgery    Take these medicines the morning of surgery with A SIP OF WATER  dorzolamide-timolol (COSOPT) nebivolol (BYSTOLIC)    HOW TO MANAGE YOUR DIABETES BEFORE AND AFTER SURGERY  Why is it important to control my blood sugar before and after surgery?  Improving blood sugar levels before and after surgery helps healing and can limit problems.  A way of improving blood sugar control is eating a healthy diet by: o  Eating less sugar and carbohydrates o  Increasing activity/exercise o  Talking with your doctor about reaching your blood sugar goals  High blood sugars (greater than 180 mg/dL) can raise your risk of infections and slow your recovery, so you will need to focus on controlling your diabetes during the weeks before surgery.  Make sure that the doctor who takes care of your diabetes knows about your planned surgery including the date and location.  How do I manage my blood sugar before surgery?  Check your blood sugar at least 4 times a day, starting 2 days before surgery, to make sure that the level is not too high or low. o Check your blood sugar the morning of your surgery when you wake up and every 2 hours until you get to the Short Stay unit.  If your blood sugar is less than 70 mg/dL, you will need to treat for low blood sugar: o Do not take insulin. o Treat a low blood sugar (less than 70 mg/dL) with  cup of clear juice (cranberry or apple),  4 glucose tablets, OR glucose gel. Recheck blood sugar in 15 minutes after treatment (to make sure it is greater than 70 mg/dL). If your blood sugar is not greater than 70 mg/dL on recheck, call [phone] o  for further instructions.  Report your blood sugar to the short stay nurse when you get to Short Stay.   If you are admitted to the hospital after surgery: o Your blood sugar will be checked by the staff and you will probably be given insulin after surgery (instead of oral diabetes medicines) to make sure you have good blood sugar levels. o The goal for blood sugar control after surgery is 80-180 mg/dL.     WHAT DO I DO ABOUT MY DIABETES MEDICATION?    Do not take oral diabetes medicines (pills):Empagliflozin-metFORMIN HCl ER (SYNJARDY XR)  the morning of surgery.   THE NIGHT BEFORE SURGERY, take 15 units of Insulin Glargine (BASAGLAR KWIKPEN) insulin. Do NOT take bedtime dose of insulin lispro (HUMALOG). Do NOT take Empagliflozin-metFORMIN HCl ER (SYNJARDY XR) the day before surgery.         THE MORNING OF SURGERY Do NOT take insulin lispro (HUMALOG) unless blood sugar is over 220.        The day of surgery, do not take other diabetes injectables, including Byetta (exenatide), Bydureon (exenatide ER), Victoza (liraglutide), or Trulicity (dulaglutide).  Follow your surgeon's instructions on when to stop Xarelto-last  dose 03/07/20.  If no instructions were given by your surgeon then you will need to call the office to get those instructions.     As of today, STOP taking any Aspirin (unless otherwise instructed by your surgeon) Aleve, Naproxen, Ibuprofen, Motrin, Advil, Goody's, BC's, all herbal medications, fish oil, and all vitamins.                      Do not wear jewelry, make up, or nail polish            Do not wear lotions, powders, perfumes/colognes, or deodorant.            Do not shave 48 hours prior to surgery.  Men may shave face and neck.            Do not  bring valuables to the hospital.            [facility] is not responsible for any belongings or valuables.  Do NOT Smoke (Tobacco/Vaping) or drink Alcohol 24 hours prior to your procedure If you use a CPAP at night, you may bring all equipment for your overnight stay.   Contacts, glasses, dentures or bridgework may not be worn into surgery.      For patients admitted to the hospital, discharge time will be determined by your treatment team.   Patients discharged the day of surgery will not be allowed to drive home, and someone needs to stay with them for 24 hours.    Special instructions:   [place]- Preparing For Surgery  Before surgery, you can play an important role. Because skin is not sterile, your skin needs to be as free of germs as possible. You can reduce the number of germs on your skin by washing with CHG (chlorahexidine gluconate) Soap before surgery.  CHG is an antiseptic cleaner which kills germs and bonds with the skin to continue killing germs even after washing.    Oral Hygiene is also important to reduce your risk of infection.  Remember - BRUSH YOUR TEETH THE MORNING OF SURGERY WITH YOUR REGULAR TOOTHPASTE  Please do not use if you have an allergy to CHG or antibacterial soaps. If your skin becomes reddened/irritated stop using the CHG.  Do not shave (including legs and underarms) for at least 48 hours prior to first CHG shower. It is OK to shave your face.  Please follow these instructions carefully.   1. Shower the NIGHT BEFORE SURGERY and the MORNING OF SURGERY with CHG Soap.   2. If you chose to wash your hair, wash your hair first as usual with your normal shampoo.  3. After you shampoo, rinse your hair and body thoroughly to remove the shampoo.  4. Use CHG as you would any other liquid soap. You can apply CHG directly to the skin and wash gently with a scrungie or a clean washcloth.   5. Apply the CHG Soap to your body ONLY FROM THE NECK DOWN.  Do not  use on open wounds or open sores. Avoid contact with your eyes, ears, mouth and genitals (private parts). Wash Face and genitals (private parts)  with your normal soap.   6. Wash thoroughly, paying special attention to the area where your surgery will be performed.  7. Thoroughly rinse your body with warm water from the neck down.  8. DO NOT shower/wash with your normal soap after using and rinsing off the CHG Soap.  9. Pat yourself dry with a  CLEAN TOWEL.  10. Wear CLEAN PAJAMAS to bed the night before surgery  11. Place CLEAN SHEETS on your bed the night of your first shower and DO NOT SLEEP WITH PETS.   Day of Surgery: Wear Clean/Comfortable clothing the morning of surgery Do not apply any deodorants/lotions.   Remember to brush your teeth WITH YOUR REGULAR TOOTHPASTE.   Please read over the following fact sheets that you were given.

## 2020-03-08 NOTE — Progress Notes (Signed)
PCP - Dr. Scarlette Calico Cardiologist - Dr. Marlou Porch   Chest x-ray - N/A EKG - 12/08/19 Stress Test -denies  ECHO - 09/24/19 Cardiac Cath - denies  Sleep Study -denies    Fasting Blood Sugar - 90-120's Checks Blood Sugar 3-4 times a day  Blood Thinner Instructions: last dose of Xarelto 03/07/20 Aspirin Instructions: Patient instructed to hold all Aspirin, NSAID's, herbal medications, fish oil and vitamins as of today   COVID TEST- 03/08/20 at at [facility]. Pt instructed to remain in their car. Educated on Transport planner until [employer].    Anesthesia review:   Patient denies shortness of breath, fever, cough and chest pain at PAT appointment   All instructions explained to the patient, with a verbal understanding of the material. Patient agrees to go over the instructions while at home for a better understanding. Patient also instructed to self quarantine after being tested for COVID-19. The opportunity to ask questions was provided.

## 2020-03-08 NOTE — Telephone Encounter (Signed)
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 However, it looks like in chart she is dealing with some major vascular surgery coming up. Let's put a reminder to reach out to her at the beginning of the year. Thank you. GM

## 2020-03-09 ENCOUNTER — Encounter (HOSPITAL_COMMUNITY): Payer: Self-pay | Admitting: Surgery

## 2020-03-09 ENCOUNTER — Encounter (HOSPITAL_COMMUNITY): Payer: Self-pay | Admitting: Anesthesiology

## 2020-03-09 ENCOUNTER — Ambulatory Visit (HOSPITAL_COMMUNITY)
Admission: RE | Admit: 2020-03-09 | Discharge: 2020-03-09 | Disposition: A | Discharge status: Home / Self Care | Payer: PPO | Attending: Surgery | Admitting: Surgery

## 2020-03-09 ENCOUNTER — Encounter (HOSPITAL_COMMUNITY)
Admission: RE | Disposition: A | Discharge status: Home / Self Care | Payer: Self-pay | Source: Home / Self Care | Attending: Surgery

## 2020-03-09 DIAGNOSIS — Z888 Allergy status to other drugs, medicaments and biological substances status: Secondary | ICD-10-CM | POA: Insufficient documentation

## 2020-03-09 DIAGNOSIS — E11621 Type 2 diabetes mellitus with foot ulcer: Secondary | ICD-10-CM | POA: Diagnosis not present

## 2020-03-09 DIAGNOSIS — Z794 Long term (current) use of insulin: Secondary | ICD-10-CM | POA: Insufficient documentation

## 2020-03-09 DIAGNOSIS — L97519 Non-pressure chronic ulcer of other part of right foot with unspecified severity: Secondary | ICD-10-CM | POA: Insufficient documentation

## 2020-03-09 DIAGNOSIS — I4891 Unspecified atrial fibrillation: Secondary | ICD-10-CM | POA: Insufficient documentation

## 2020-03-09 DIAGNOSIS — E1151 Type 2 diabetes mellitus with diabetic peripheral angiopathy without gangrene: Secondary | ICD-10-CM | POA: Diagnosis not present

## 2020-03-09 DIAGNOSIS — Z880 Allergy status to penicillin: Secondary | ICD-10-CM | POA: Diagnosis not present

## 2020-03-09 DIAGNOSIS — Z885 Allergy status to narcotic agent status: Secondary | ICD-10-CM | POA: Diagnosis not present

## 2020-03-09 DIAGNOSIS — Z7901 Long term (current) use of anticoagulants: Secondary | ICD-10-CM | POA: Insufficient documentation

## 2020-03-09 DIAGNOSIS — I70234 Atherosclerosis of native arteries of right leg with ulceration of heel and midfoot: Secondary | ICD-10-CM | POA: Diagnosis not present

## 2020-03-09 DIAGNOSIS — Z87891 Personal history of nicotine dependence: Secondary | ICD-10-CM | POA: Diagnosis not present

## 2020-03-09 DIAGNOSIS — I70235 Atherosclerosis of native arteries of right leg with ulceration of other part of foot: Secondary | ICD-10-CM | POA: Insufficient documentation

## 2020-03-09 HISTORY — PX: ABDOMINAL AORTOGRAM W/LOWER EXTREMITY: CATH118223

## 2020-03-09 LAB — POCT I-STAT, CHEM 8
BUN: 15 mg/dL (ref 8–23)
Calcium, Ion: 1.26 mmol/L (ref 1.15–1.40)
Chloride: 97 mmol/L — ABNORMAL LOW (ref 98–111)
Creatinine, Ser: 0.8 mg/dL (ref 0.44–1.00)
Glucose, Bld: 107 mg/dL — ABNORMAL HIGH (ref 70–99)
HCT: 40 % (ref 36.0–46.0)
Hemoglobin: 13.6 g/dL (ref 12.0–15.0)
Potassium: 3.9 mmol/L (ref 3.5–5.1)
Sodium: 140 mmol/L (ref 135–145)
TCO2: 28 mmol/L (ref 22–32)

## 2020-03-09 SURGERY — ABDOMINAL AORTOGRAM W/LOWER EXTREMITY
Anesthesia: LOCAL

## 2020-03-09 MED ORDER — SODIUM CHLORIDE 0.9 % IV SOLN: INTRAVENOUS | Status: DC

## 2020-03-09 MED ORDER — SODIUM CHLORIDE 0.9% FLUSH: 3.0000 mL | INTRAVENOUS | Status: DC | PRN

## 2020-03-09 MED ORDER — LIDOCAINE HCL (PF) 1 % IJ SOLN
INTRAMUSCULAR | Status: AC
  Filled 2020-03-09: qty 30

## 2020-03-09 MED ORDER — ACETAMINOPHEN 325 MG PO TABS: 650.0000 mg | ORAL_TABLET | ORAL | Status: DC | PRN

## 2020-03-09 MED ORDER — HYDRALAZINE HCL 20 MG/ML IJ SOLN: 5.0000 mg | INTRAMUSCULAR | Status: DC | PRN

## 2020-03-09 MED ORDER — ONDANSETRON HCL 4 MG/2ML IJ SOLN: 4.0000 mg | Freq: Four times a day (QID) | INTRAMUSCULAR | Status: DC | PRN

## 2020-03-09 MED ORDER — MIDAZOLAM HCL 2 MG/2ML IJ SOLN
INTRAMUSCULAR | Status: AC
  Filled 2020-03-09: qty 2

## 2020-03-09 MED ORDER — MORPHINE SULFATE (PF) 2 MG/ML IV SOLN: 2.0000 mg | INTRAVENOUS | Status: DC | PRN

## 2020-03-09 MED ORDER — SODIUM CHLORIDE 0.9 % IV SOLN: 250.0000 mL | INTRAVENOUS | Status: DC | PRN

## 2020-03-09 MED ORDER — IODIXANOL 320 MG/ML IV SOLN
INTRAVENOUS | Status: DC | PRN
  Administered 2020-03-09: 125 mL via INTRA_ARTERIAL

## 2020-03-09 MED ORDER — HEPARIN (PORCINE) IN NACL 1000-0.9 UT/500ML-% IV SOLN
INTRAVENOUS | Status: AC
  Filled 2020-03-09: qty 1000

## 2020-03-09 MED ORDER — SODIUM CHLORIDE 0.9% FLUSH: 3.0000 mL | Freq: Two times a day (BID) | INTRAVENOUS | Status: DC

## 2020-03-09 MED ORDER — FENTANYL CITRATE (PF) 100 MCG/2ML IJ SOLN
INTRAMUSCULAR | Status: DC | PRN
  Administered 2020-03-09: 50 ug via INTRAVENOUS

## 2020-03-09 MED ORDER — SODIUM CHLORIDE 0.9 % WEIGHT BASED INFUSION: 1.0000 mL/kg/h | INTRAVENOUS | Status: DC

## 2020-03-09 MED ORDER — LABETALOL HCL 5 MG/ML IV SOLN: 10.0000 mg | INTRAVENOUS | Status: DC | PRN

## 2020-03-09 MED ORDER — LIDOCAINE HCL (PF) 1 % IJ SOLN
INTRAMUSCULAR | Status: DC | PRN
  Administered 2020-03-09: 15 mL via INTRADERMAL

## 2020-03-09 MED ORDER — HEPARIN (PORCINE) IN NACL 1000-0.9 UT/500ML-% IV SOLN
INTRAVENOUS | Status: DC | PRN
  Administered 2020-03-09: 500 mL

## 2020-03-09 MED ORDER — MIDAZOLAM HCL 2 MG/2ML IJ SOLN
INTRAMUSCULAR | Status: DC | PRN
  Administered 2020-03-09: 2 mg via INTRAVENOUS

## 2020-03-09 MED ORDER — FENTANYL CITRATE (PF) 100 MCG/2ML IJ SOLN
INTRAMUSCULAR | Status: AC
  Filled 2020-03-09: qty 2

## 2020-03-09 SURGICAL SUPPLY — 12 items
CATH OMNI FLUSH 5F 65CM (CATHETERS) ×1 IMPLANT
CATH SOFT-VU 4F 65 STRAIGHT (CATHETERS) IMPLANT
CATH SOFT-VU STRAIGHT 4F 65CM (CATHETERS) ×2
CLOSURE MYNX CONTROL 5F (Vascular Products) ×1 IMPLANT
KIT MICROPUNCTURE NIT STIFF (SHEATH) ×1 IMPLANT
KIT PV (KITS) ×2 IMPLANT
SHEATH PINNACLE 5F 10CM (SHEATH) ×1 IMPLANT
SHEATH PROBE COVER 6X72 (BAG) ×1 IMPLANT
SYR MEDRAD MARK V 150ML (SYRINGE) ×1 IMPLANT
TRANSDUCER W/STOPCOCK (MISCELLANEOUS) ×2 IMPLANT
TRAY PV CATH (CUSTOM PROCEDURE TRAY) ×2 IMPLANT
WIRE BENTSON .035X145CM (WIRE) ×1 IMPLANT

## 2020-03-09 NOTE — Interval H&P Note (Signed)
History and Physical Interval Note:  03/09/2020 8:53 AM  Theresa Harrington  has presented today for surgery, with the diagnosis of claudication.  The various methods of treatment have been discussed with the patient and family. After consideration of risks, benefits and other options for treatment, the patient has consented to  Procedure(s): ABDOMINAL AORTOGRAM W/LOWER EXTREMITY (N/A) as a surgical intervention.  The patient's history has been reviewed, patient examined, no change in status, stable for surgery.  I have reviewed the patient's chart and labs.  Questions were answered to the patient's satisfaction.     [name]

## 2020-03-09 NOTE — Op Note (Signed)
    Patient name: Theresa Harrington MRN: [id number] DOB: [birth year] Sex: female  03/09/2020 Pre-operative Diagnosis: Right foot ulcer Post-operative diagnosis:  Same Surgeon:  [name] Procedure Performed:  1.  Ultrasound-guided access, left femoral artery  2.  Abdominal aortogram  3.  Second-order catheterization  4.  Bilateral lower extremity runoff  5.  Closure device, Mynx  6.  Conscious sedation, 37 minutes    Indications: This is a 64 year old female with history of left femoral-popliteal bypass graft who now has a right foot ulcer.  She is here for arteriogram and possible invention  Procedure:  The patient was identified in the holding area and taken to room 8.  The patient was then placed supine on the table and prepped and draped in the usual sterile fashion.  A time out was called.  Conscious sedation was administered with the use of IV fentanyl and Versed under continuous physician and nurse monitoring.  Heart rate, blood pressure, and oxygen saturation were continuously monitored.  Total sedation time was 37 minutes.  Ultrasound was used to evaluate the left common femoral artery.  It was patent .  A digital ultrasound image was acquired.  A micropuncture needle was used to access the left common femoral artery under ultrasound guidance.  An 018 wire was advanced without resistance and a micropuncture sheath was placed.  The 018 wire was removed and a benson wire was placed.  The micropuncture sheath was exchanged for a 5 french sheath.  An omniflush catheter was advanced over the wire to the level of L-1.  An abdominal angiogram was obtained.  Next, using the omniflush catheter and a benson wire, the aortic bifurcation was crossed and the catheter was placed into theright external iliac artery and right runoff was obtained.  left runoff was performed via retrograde sheath injections.  Findings:   Aortogram: No significant renal artery stenosis was identified.  The infrarenal  abdominal aorta is widely patent.  Bilateral common and external iliac arteries widely patent.  Right Lower Extremity: Diffuse calcific disease within the right common femoral artery with approximate 70% stenosis.  The profundofemoral artery is patent.  The superficial femoral artery is a flush occlusion.  There is reconstitution from geniculate collaterals of the anterior tibial artery which backfills into a small below-knee popliteal artery.  There does appear to be opacification of all 3 tibial vessels however they are very small in caliber  Left Lower Extremity: Left common femoral profundofemoral artery widely patent.  The femoral-popliteal bypass graft is occluded.  The superficial femoral artery is occluded.  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  There is a flush occlusion of the right superficial femoral artery with reconstitution of the anterior tibial artery with backfilling of the below-knee popliteal artery.  The tibial vessels are small in caliber.  #3  The patient will be considered for right femoral endarterectomy with femoral below-knee popliteal artery bypass graft   V. [name], M.D., [facility] [place] Office: [phone] Pager:  [phone]

## 2020-03-09 NOTE — Discharge Instructions (Signed)
Femoral Site Care This sheet gives you information about how to care for yourself after your procedure. Your health care provider may also give you more specific instructions. If you have problems or questions, contact your health care provider. What can I expect after the procedure? After the procedure, it is common to have:  Bruising that usually fades within 1-2 weeks.  Tenderness at the site. Follow these instructions at home: Wound care  Follow instructions from your health care provider about how to take care of your insertion site. Make sure you: ? Wash your hands with soap and water before you change your bandage (dressing). If soap and water are not available, use hand sanitizer. ? Change your dressing as told by your health care provider. ? Leave stitches (sutures), skin glue, or adhesive strips in place. These skin closures may need to stay in place for 2 weeks or longer. If adhesive strip edges start to loosen and curl up, you may trim the loose edges. Do not remove adhesive strips completely unless your health care provider tells you to do that.  Do not take baths, swim, or use a hot tub until your health care provider approves.  You may shower 24-48 hours after the procedure or as told by your health care provider. ? Gently wash the site with plain soap and water. ? Pat the area dry with a clean towel. ? Do not rub the site. This may cause bleeding.  Do not apply powder or lotion to the site. Keep the site clean and dry.  Check your femoral site every day for signs of infection. Check for: ? Redness, swelling, or pain. ? Fluid or blood. ? Warmth. ? Pus or a bad smell. Activity  For the first 2-3 days after your procedure, or as long as directed: ? Avoid climbing stairs as much as possible. ? Do not squat.  Do not lift anything that is heavier than 10 lb (4.5 kg), or the limit that you are told, until your health care provider says that it is safe.  Rest as  directed. ? Avoid sitting for a long time without moving. Get up to take short walks every 1-2 hours.  Do not drive for 24 hours if you were given a medicine to help you relax (sedative). General instructions  Take over-the-counter and prescription medicines only as told by your health care provider.  Keep all follow-up visits as told by your health care provider. This is important. Contact a health care provider if you have:  A fever or chills.  You have redness, swelling, or pain around your insertion site. Get help right away if:  The catheter insertion area swells very fast.  You pass out.  You suddenly start to sweat or your skin gets clammy.  The catheter insertion area is bleeding, and the bleeding does not stop when you hold steady pressure on the area.  The area near or just beyond the catheter insertion site becomes pale, cool, tingly, or numb. These symptoms may represent a serious problem that is an emergency. Do not wait to see if the symptoms will go away. Get medical help right away. Call your local emergency services (911 in the [place] Do not drive yourself to the hospital. Summary  After the procedure, it is common to have bruising that usually fades within 1-2 weeks.  Check your femoral site every day for signs of infection.  Do not lift anything that is heavier than 10 lb (4.5 kg), or the   limit that you are told, until your health care provider says that it is safe. This information is not intended to replace advice given to you by your health care provider. Make sure you discuss any questions you have with your health care provider. Document Revised: 04/30/2017 Document Reviewed: 04/30/2017 Elsevier Patient Education  2020 [employer]  

## 2020-03-09 NOTE — Progress Notes (Signed)
Patient was given discharge instructions. She verbalized understanding. 

## 2020-03-10 ENCOUNTER — Encounter (HOSPITAL_COMMUNITY): Admission: RE | Payer: Self-pay | Source: Home / Self Care

## 2020-03-10 ENCOUNTER — Inpatient Hospital Stay (HOSPITAL_COMMUNITY): Admission: RE | Admit: 2020-03-10 | Payer: PPO | Source: Home / Self Care | Admitting: Surgery

## 2020-03-10 SURGERY — BYPASS GRAFT FEMORAL-POPLITEAL ARTERY
Anesthesia: General | Laterality: Right

## 2020-03-15 ENCOUNTER — Other Ambulatory Visit: Payer: Self-pay

## 2020-03-15 ENCOUNTER — Ambulatory Visit (INDEPENDENT_AMBULATORY_CARE_PROVIDER_SITE_OTHER): Payer: PPO | Admitting: Surgery

## 2020-03-15 ENCOUNTER — Encounter: Payer: Self-pay | Admitting: *Deleted

## 2020-03-15 ENCOUNTER — Encounter: Payer: Self-pay | Admitting: Surgery

## 2020-03-15 VITALS — BP 123/65 | HR 59 | Temp 98.0°F | Resp 20 | Ht 64.0 in | Wt 170.0 lb

## 2020-03-15 DIAGNOSIS — I7025 Atherosclerosis of native arteries of other extremities with ulceration: Secondary | ICD-10-CM

## 2020-03-15 NOTE — Progress Notes (Signed)
Vascular and Vein Specialist of [facility]  Patient name: Theresa Harrington MRN: [id number] DOB: [birth year] Sex: female   REASON FOR VISIT:    Follow up  HISOTRY OF PRESENT ILLNESS:    Theresa Harrington is a 64 y.o. female who is back for follow-up.  She initially underwent a left femoral-popliteal bypass graft by Dr. Donnetta Hutching on 06/08/2014.  Unfortunately, this occluded early.  I took her back to the operating room on 06/09/2014 and we did her bypass graft with Gore-Tex.  I also performed a endarterectomy of the left anterior tibial artery.  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  Her bypass graft is known to be occluded.    She developed a sore on the bottom of her right great toe which look like a popped blister.  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  She felt that her sore was getting better and so we elected to monitor this with plans for bypass to the deteriorate.  She is back for follow-up.  She feels like the sore is getting better  She continues to struggle with control of her diabetes. Her last A1c was 7.5. She remains on statin therapy.  She is anticoagulated for atrial fibrillation  PAST MEDICAL HISTORY:   Past Medical History:  Diagnosis Date  . Allergy   . Anxiety    Pt. denies  . Arthritis    right index figer  . Asymptomatic cholelithiasis   . Atherosclerosis of aorta ([place])   . Cataract   . Clotting disorder ([place])   . Gallstones 06/2013  . GERD (gastroesophageal reflux disease)   . History of kidney stones    lithotrispy  . Hyperlipidemia   . Hypertension   . Hypothyroidism    no meds  . Iron deficiency anemia   . PAD (peripheral artery disease) ([place])   . PAF (paroxysmal atrial fibrillation) ([place])   . Peripheral arterial occlusive disease (HCC)    lower extremities  . Pneumonia   . PONV (postoperative nausea and vomiting)     . Right ureteral stone   . Type 2 diabetes mellitus (HCC)    Type  . Vitamin B 12 deficiency   . Wears glasses      FAMILY HISTORY:   Family History  Problem Relation Age of Onset  . Diabetes Father   . Heart disease Father   . Deep vein thrombosis Father   . Hyperlipidemia Father   . Diabetes Sister   . Heart disease Sister   . Deep vein thrombosis Sister   . Hyperlipidemia Sister   . Diabetes Brother   . Heart disease Brother   . Cancer Brother   . Hyperlipidemia Brother   . Colon cancer Maternal Aunt   . Diabetes Brother   . Diabetes Brother   . Kidney disease Mother   . Hyperlipidemia Mother   . Other Mother        AAA   and    Amputation  . Arthritis Other   . Cancer Other        colon  . Hypertension Other   . Stroke Other   . Esophageal cancer Neg Hx   . Stomach cancer Neg Hx   . Inflammatory bowel disease Neg Hx   . Liver disease Neg Hx   . Pancreatic cancer Neg Hx     SOCIAL HISTORY:   Social History   Tobacco  Use  . Smoking status: Former Smoker    Packs/day: 1.00    Years: 28.00    Pack years: 28.00    Types: Cigarettes    Quit date: 09/05/2001    Years since quitting: 18.5  . Smokeless tobacco: Never Used  Substance Use Topics  . Alcohol use: No    Alcohol/week: 0.0 standard drinks     ALLERGIES:   Allergies  Allergen Reactions  . Amlodipine Swelling  . Atorvastatin Other (See Comments)    Muscle aches   . Ampicillin Nausea And Vomiting and Other (See Comments)  . Codeine Nausea And Vomiting    Ask patient  . Lisinopril Cough       . Penicillins Nausea And Vomiting    Did it involve swelling of the face/tongue/throat, SOB, or low BP? No Did it involve sudden or severe rash/hives, skin peeling, or any reaction on the inside of your mouth or nose? No Did you need to seek medical attention at a hospital or doctor's office? No When did it last happen?20+ years If all above answers are "NO", may proceed with cephalosporin  use.   [name] [Sitagliptin] Nausea And Vomiting     CURRENT MEDICATIONS:   Current Outpatient Medications  Medication Sig Dispense Refill  . Blood Glucose Monitoring Suppl (ONETOUCH VERIO IQ SYSTEM) w/Device KIT USE TO CHECK SUGAR DAILY 1 kit 0  . cetirizine (ZYRTEC) 10 MG tablet Take 10 mg by mouth daily as needed for allergies.    [name] dorzolamide-timolol (COSOPT) 22.3-6.8 MG/ML ophthalmic solution Place 1 drop into the right eye 2 (two) times daily.     . Empagliflozin-metFORMIN HCl ER (SYNJARDY XR) 12.08-998 MG TB24 Take 1 tablet by mouth daily. 90 tablet 1  . ezetimibe (ZETIA) 10 MG tablet TAKE ONE TABLET BY MOUTH EVERYDAY AT BEDTIME (Patient taking differently: Take 10 mg by mouth at bedtime. ) 90 tablet 1  . Glucagon (GVOKE HYPOPEN 2-PACK) 1 MG/0.2ML SOAJ Inject 1 Act into the skin daily as needed. 2 mL 5  . glucose blood test strip Use to check blood sugar twice daily. DX: E11.8 200 each 3  . Insulin Glargine (BASAGLAR KWIKPEN) 100 UNIT/ML Inject 0.3 mLs (30 Units total) into the skin daily. (Patient taking differently: Inject 50 Units into the skin at bedtime. ) 15 mL 1  . insulin lispro (HUMALOG) 100 UNIT/ML injection Inject 14-20 Units into the skin 3 (three) times daily before meals.     . Insulin Pen Needle (PEN NEEDLES) 32G X 4 MM MISC Inject 1 pen as directed 4 (four) times daily. Use to inject levemir or novolog as directed. 400 each 3  . latanoprost (XALATAN) 0.005 % ophthalmic solution Place 1 drop into both eyes at bedtime.     [name] losartan-hydrochlorothiazide (HYZAAR) 100-12.5 MG tablet TAKE ONE TABLET BY MOUTH EVERYDAY AT BEDTIME (Patient taking differently: Take 1 tablet by mouth daily. ) 90 tablet 1  . magnesium oxide (MAG-OX) 400 MG tablet TAKE ONE TABLET BY MOUTH EVERYDAY AT BEDTIME (Patient taking differently: Take 400 mg by mouth at bedtime. ) 90 tablet 1  . mupirocin ointment (BACTROBAN) 2 % Apply 1 application topically daily.     . nebivolol (BYSTOLIC) 5 MG tablet  Take 1 tablet (5 mg total) by mouth daily. 90 tablet 1  . omeprazole (PRILOSEC) 40 MG capsule TAKE ONE CAPSULE BY MOUTH EVERY MORNING and TAKE ONE CAPSULE BY MOUTH EVERY EVENING (Patient taking differently: Take 40 mg by mouth daily. ) 180 capsule  1  . potassium chloride SA (KLOR-CON) 20 MEQ tablet Take 1 tablet (20 mEq total) by mouth daily. 90 tablet 1  . pravastatin (PRAVACHOL) 40 MG tablet TAKE ONE TABLET BY MOUTH EVERYDAY AT BEDTIME (Patient taking differently: Take 40 mg by mouth at bedtime. ) 90 tablet 1  . rivaroxaban (XARELTO) 20 MG TABS tablet Take 1 tablet (20 mg total) by mouth daily. 90 tablet 1  . Semaglutide, 1 MG/DOSE, (OZEMPIC, 1 MG/DOSE,) 4 MG/3ML SOPN Inject 0.75 mLs (1 mg total) into the skin once a week. (Patient taking differently: Inject 1 mg into the skin every Friday. ) 9 mL 1  . thiamine 100 MG tablet Take 1 tablet (100 mg total) by mouth every other day. 45 tablet 1  . Vilazodone HCl (VIIBRYD STARTER PACK) 10 & 20 MG KIT Take 1 tablet by mouth daily. 1 kit 0   No current facility-administered medications for this visit.    REVIEW OF SYSTEMS:   '[X]'  denotes positive finding, '[ ]'  denotes negative finding Cardiac  Comments:  Chest pain or chest pressure:    Shortness of breath upon exertion:    Short of breath when lying flat:    Irregular heart rhythm:        Vascular    Pain in calf, thigh, or hip brought on by ambulation:    Pain in feet at night that wakes you up from your sleep:     Blood clot in your veins:    Leg swelling:         Pulmonary    Oxygen at home:    Productive cough:     Wheezing:         Neurologic    Sudden weakness in arms or legs:     Sudden numbness in arms or legs:     Sudden onset of difficulty speaking or slurred speech:    Temporary loss of vision in one eye:     Problems with dizziness:         Gastrointestinal    Blood in stool:     Vomited blood:         Genitourinary    Burning when urinating:     Blood in urine:         Psychiatric    Major depression:         Hematologic    Bleeding problems:    Problems with blood clotting too easily:        Skin    Rashes or ulcers: x       Constitutional    Fever or chills:      PHYSICAL EXAM:   Vitals:   03/15/20 1037  BP: 123/65  Pulse: (!) 59  Resp: 20  Temp: 98 F (36.7 C)  SpO2: 97%  Weight: 170 lb (77.1 kg)  Height: '5\' 4"'  (1.626 m)    GENERAL: The patient is a well-nourished female, in no acute distress. The vital signs are documented above. CARDIAC: There is a regular rate and rhythm.  VASCULAR: Nonpalpable pedal pulses PULMONARY: Non-labored respirations MUSCULOSKELETAL: There are no major deformities or cyanosis. NEUROLOGIC: No focal weakness or paresthesias are detected. SKIN: The area of concern looks like a old blister.  There is a 1 mm scab in the middle.  This appears to be healing. PSYCHIATRIC: The patient has a normal affect.  STUDIES:   None  MEDICAL ISSUES:   The wound appears to be healing on the ball of her right  foot.  She had limited durability with a bypass on her left leg.  Since her wound is getting better, I Georgina Peer continue to monitor this without plans for revascularization.  She checks it every day.  If she notices a change she will contact me and we will schedule her for a right femoral endarterectomy with below-knee popliteal artery bypass graft, likely with Gore-Tex.  She will need to be off of her Xarelto prior to surgery.  In hopes that her wound heals, I have her scheduled for follow-up in 6 months with ABIs and a carotid duplex.    Leia Alf, MD, FACS Vascular and Vein Specialists of [facility] [phone] Pager [phone]

## 2020-03-16 DIAGNOSIS — S98132A Complete traumatic amputation of one left lesser toe, initial encounter: Secondary | ICD-10-CM | POA: Diagnosis not present

## 2020-03-16 DIAGNOSIS — E1151 Type 2 diabetes mellitus with diabetic peripheral angiopathy without gangrene: Secondary | ICD-10-CM | POA: Diagnosis not present

## 2020-03-16 DIAGNOSIS — Z8631 Personal history of diabetic foot ulcer: Secondary | ICD-10-CM | POA: Diagnosis not present

## 2020-03-17 NOTE — Telephone Encounter (Addendum)
    [employer] calling to offer assistance if needed for Gvoke prior auth. They have an open request started, is this still needed?  Please call [phone]

## 2020-03-30 ENCOUNTER — Telehealth: Payer: Self-pay | Admitting: Pharmacist

## 2020-03-30 NOTE — Progress Notes (Addendum)
Chronic Care Management Pharmacy Assistant   Name: Averee Harb  MRN: [id number] DOB: 07-15-1955  Reason for Encounter: Medication Review    PCP : [name], MD  Allergies:   Allergies  Allergen Reactions   Amlodipine Swelling   Atorvastatin Other (See Comments)    Muscle aches    Ampicillin Nausea And Vomiting and Other (See Comments)   Codeine Nausea And Vomiting    Ask patient   Lisinopril Cough        Penicillins Nausea And Vomiting    Did it involve swelling of the face/tongue/throat, SOB, or low BP? No Did it involve sudden or severe rash/hives, skin peeling, or any reaction on the inside of your mouth or nose? No Did you need to seek medical attention at a hospital or doctor's office? No When did it last happen?      20+ years If all above answers are "NO", may proceed with cephalosporin use.    Januvia [Sitagliptin] Nausea And Vomiting    Medications: Outpatient Encounter Medications as of 03/30/2020  Medication Sig Note   Blood Glucose Monitoring Suppl (ONETOUCH VERIO IQ SYSTEM) w/Device KIT USE TO CHECK SUGAR DAILY    cetirizine (ZYRTEC) 10 MG tablet Take 10 mg by mouth daily as needed for allergies.    dorzolamide-timolol (COSOPT) 22.3-6.8 MG/ML ophthalmic solution Place 1 drop into the right eye 2 (two) times daily.     Empagliflozin-metFORMIN HCl ER (SYNJARDY XR) 12.08-998 MG TB24 Take 1 tablet by mouth daily.    ezetimibe (ZETIA) 10 MG tablet TAKE ONE TABLET BY MOUTH EVERYDAY AT BEDTIME (Patient taking differently: Take 10 mg by mouth at bedtime. )    Glucagon (GVOKE HYPOPEN 2-PACK) 1 MG/0.2ML SOAJ Inject 1 Act into the skin daily as needed.    glucose blood test strip Use to check blood sugar twice daily. DX: E11.8    Insulin Glargine (BASAGLAR KWIKPEN) 100 UNIT/ML Inject 0.3 mLs (30 Units total) into the skin daily. (Patient taking differently: Inject 50 Units into the skin at bedtime. )    insulin lispro (HUMALOG) 100 UNIT/ML injection Inject 14-20  Units into the skin 3 (three) times daily before meals.     Insulin Pen Needle (PEN NEEDLES) 32G X 4 MM MISC Inject 1 pen as directed 4 (four) times daily. Use to inject levemir or novolog as directed.    latanoprost (XALATAN) 0.005 % ophthalmic solution Place 1 drop into both eyes at bedtime.     losartan-hydrochlorothiazide (HYZAAR) 100-12.5 MG tablet TAKE ONE TABLET BY MOUTH EVERYDAY AT BEDTIME (Patient taking differently: Take 1 tablet by mouth daily. )    magnesium oxide (MAG-OX) 400 MG tablet TAKE ONE TABLET BY MOUTH EVERYDAY AT BEDTIME (Patient taking differently: Take 400 mg by mouth at bedtime. )    mupirocin ointment (BACTROBAN) 2 % Apply 1 application topically daily.     nebivolol (BYSTOLIC) 5 MG tablet Take 1 tablet (5 mg total) by mouth daily.    omeprazole (PRILOSEC) 40 MG capsule TAKE ONE CAPSULE BY MOUTH EVERY MORNING and TAKE ONE CAPSULE BY MOUTH EVERY EVENING (Patient taking differently: Take 40 mg by mouth daily. )    potassium chloride SA (KLOR-CON) 20 MEQ tablet Take 1 tablet (20 mEq total) by mouth daily.    pravastatin (PRAVACHOL) 40 MG tablet TAKE ONE TABLET BY MOUTH EVERYDAY AT BEDTIME (Patient taking differently: Take 40 mg by mouth at bedtime. )    rivaroxaban (XARELTO) 20 MG TABS tablet Take 1 tablet (  20 mg total) by mouth daily.    Semaglutide, 1 MG/DOSE, (OZEMPIC, 1 MG/DOSE,) 4 MG/3ML SOPN Inject 0.75 mLs (1 mg total) into the skin once a week. (Patient taking differently: Inject 1 mg into the skin every Friday. )    thiamine 100 MG tablet Take 1 tablet (100 mg total) by mouth every other day.    Vilazodone HCl (VIIBRYD STARTER PACK) 10 & 20 MG KIT Take 1 tablet by mouth daily. 03/03/2020: Has not started yet   [DISCONTINUED] ezetimibe (ZETIA) 10 MG tablet Take 1 tablet (10 mg total) by mouth at bedtime.    [DISCONTINUED] losartan-hydrochlorothiazide (HYZAAR) 100-12.5 MG tablet Take 1 tablet by mouth daily.    [DISCONTINUED] magnesium oxide (MAG-OX) 400 MG tablet Take 1  tablet (400 mg total) by mouth at bedtime.    [DISCONTINUED] omeprazole (PRILOSEC) 40 MG capsule Take 1 capsule (40 mg total) by mouth 2 (two) times daily before a meal.    [DISCONTINUED] pravastatin (PRAVACHOL) 40 MG tablet Take 1 tablet (40 mg total) by mouth daily.    No facility-administered encounter medications on file as of 03/30/2020.    Current Diagnosis: Patient Active Problem List   Diagnosis Date Noted   UTI (urinary tract infection) 01/21/2020   Lumbar strain, initial encounter 01/07/2020   Amputated toe of left foot ([place]) 01/06/2020   DM (diabetes mellitus) type II, controlled, with peripheral vascular disorder ([place]) 01/06/2020   Hirsutism 01/06/2020   Mitral valve stenosis    Diabetes mellitus ([place]) 12/17/2019   Atrial fibrillation (HCC)    Pressure injury of right foot, stage 1 09/18/2019   UTI due to extended-spectrum beta lactamase (ESBL) producing Escherichia coli 01/11/2019   Gastroesophageal reflux disease with esophagitis 12/10/2018   Barrett's esophagus with high grade dysplasia 11/06/2018   Current use of long term anticoagulation 11/06/2018   Diuretic-induced hypokalemia 10/08/2018   Midline low back pain without sciatica 08/14/2018   Thiamine deficiency 11/05/2017   Atrial fibrillation with RVR ([place]) 11/08/2016   Chronic idiopathic constipation 06/05/2016   Morbid obesity due to excess calories ([place]) 08/04/2015   Routine general medical examination at a health care facility 01/28/2015   Visit for screening mammogram 01/27/2015   Screening for cervical cancer 01/27/2015   Kidney stone on right side 01/27/2015   Atherosclerosis of native arteries of the extremities with ulceration ([place]) 11/14/2013   Anemia, iron deficiency 10/28/2013   Vitamin B12 deficiency neuropathy ([place]) 10/28/2013   History of laparoscopic adjustable gastric banding, 05/29/2005. 10/09/2013   GERD (gastroesophageal reflux disease) 08/06/2012   Allergic rhinitis, cause unspecified  07/04/2012   Low back pain radiating to both legs 07/04/2012   GOITER, MULTINODULAR 11/04/2008   Uncontrolled type 2 diabetes mellitus with peripheral artery disease ([place]) 08/05/2008   Hyperlipidemia with target LDL less than 100 08/05/2008   Essential hypertension, benign 08/05/2008    Goals Addressed   None     Follow-Up:  Coordination of Enhanced Pharmacy Services    Reviewed chart for medication changes ahead of medication coordination call.  No OVs, Consults, or hospital visits since last care coordination call/Pharmacist visit. (If appropriate, list visit date, provider name)  No medication changes indicated OR if recent visit, treatment plan here.  BP Readings from Last 3 Encounters:  03/15/20 123/65  03/09/20 (!) 115/57  03/08/20 (!) 149/72    Lab Results  Component Value Date   HGBA1C 7.3 (A) 12/17/2019     Patient obtains medications through Adherence Packaging  90 Days   Last  adherence delivery 02/02/2020 included:   Ezetimibe 10 mg 1 tab by mouth at bedtime Losart-hydrochlorothiazide 100-12.5 mg 1 tab by mouth at bedtime  Magnesium Oxide 400 mg 1 tab by mouth at bedtime Omeprazole 40 mg 1 cap by mouth every morning and every evening Potassium Cl Er 20 meq 1 tab by mouth every morning Pravastatin 40 mg 1 tab by mouth every day at bedtime Vitamin B-1 100 mg one tab every day at bedtime   Patient declined need for medication delivery at this time.  Patient is due for next adherence delivery on: 05/04/20.   Wendy Poet, Clinical Pharmacist Assistant [facility]

## 2020-04-01 ENCOUNTER — Other Ambulatory Visit: Payer: Self-pay | Admitting: Internal Medicine

## 2020-04-01 DIAGNOSIS — I4891 Unspecified atrial fibrillation: Secondary | ICD-10-CM

## 2020-04-01 DIAGNOSIS — IMO0002 Reserved for concepts with insufficient information to code with codable children: Secondary | ICD-10-CM

## 2020-04-01 DIAGNOSIS — E1151 Type 2 diabetes mellitus with diabetic peripheral angiopathy without gangrene: Secondary | ICD-10-CM

## 2020-04-01 MED ORDER — RIVAROXABAN 20 MG PO TABS: 20.0000 mg | ORAL_TABLET | Freq: Every day | ORAL | 1 refills | Status: DC

## 2020-04-01 MED ORDER — BAQSIMI TWO PACK 3 MG/DOSE NA POWD: 1.0000 | Freq: Once | NASAL | 3 refills | Status: DC | PRN

## 2020-04-01 NOTE — Telephone Encounter (Signed)
Gvoke is a non-formulary product. The PA will not be approved unless patient has tried/failed Baqsimi (glucagon nasal spray).

## 2020-04-08 DIAGNOSIS — S8391XA Sprain of unspecified site of right knee, initial encounter: Secondary | ICD-10-CM | POA: Diagnosis not present

## 2020-04-08 DIAGNOSIS — M25561 Pain in right knee: Secondary | ICD-10-CM | POA: Diagnosis not present

## 2020-04-15 DIAGNOSIS — Z8631 Personal history of diabetic foot ulcer: Secondary | ICD-10-CM | POA: Diagnosis not present

## 2020-04-15 DIAGNOSIS — S98132A Complete traumatic amputation of one left lesser toe, initial encounter: Secondary | ICD-10-CM | POA: Diagnosis not present

## 2020-04-15 DIAGNOSIS — L89891 Pressure ulcer of other site, stage 1: Secondary | ICD-10-CM | POA: Diagnosis not present

## 2020-04-15 DIAGNOSIS — E1151 Type 2 diabetes mellitus with diabetic peripheral angiopathy without gangrene: Secondary | ICD-10-CM | POA: Diagnosis not present

## 2020-04-17 ENCOUNTER — Other Ambulatory Visit: Payer: Self-pay | Admitting: Internal Medicine

## 2020-04-17 DIAGNOSIS — T502X5A Adverse effect of carbonic-anhydrase inhibitors, benzothiadiazides and other diuretics, initial encounter: Secondary | ICD-10-CM

## 2020-04-19 ENCOUNTER — Ambulatory Visit: Payer: PPO | Admitting: Pharmacist

## 2020-04-19 ENCOUNTER — Other Ambulatory Visit: Payer: Self-pay

## 2020-04-19 DIAGNOSIS — IMO0002 Reserved for concepts with insufficient information to code with codable children: Secondary | ICD-10-CM

## 2020-04-19 DIAGNOSIS — E785 Hyperlipidemia, unspecified: Secondary | ICD-10-CM

## 2020-04-19 DIAGNOSIS — I10 Essential (primary) hypertension: Secondary | ICD-10-CM

## 2020-04-19 DIAGNOSIS — I4891 Unspecified atrial fibrillation: Secondary | ICD-10-CM
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
## 2020-04-19 NOTE — Patient Instructions (Signed)
Visit Information  Renew applications for patient assistance.  Phone number for Pharmacist: [phone]  Goals Addressed            This Visit's Progress   . Pharmacy Care Plan       CARE PLAN ENTRY  Current Barriers:  . Chronic Disease Management support, education, and care coordination needs related to Hypertension, Hyperlipidemia, Diabetes, Atrial Fibrillation, and GERD   Hypertension/Atrial Fibrillation BP Readings from Last 3 Encounters:  03/15/20 123/65  03/09/20 (!) 115/57  03/08/20 (!) 149/72   Pulse Readings from Last 3 Encounters:  03/15/20 (!) 59  03/09/20 (!) 56  03/08/20 (!) 58 .  Pharmacist Clinical Goal(s): o Over the next 90 days, patient will work with PharmD and providers to achieve BP goal <130/80 and HR goal 80-110 . Current regimen:  o Bystolic 5 mg daily o Losartan-HCTZ 100-12.5 mg daily o Xarelto 20 mg daily . Interventions: o Discussed BP goal and benefits of medications o Patient assistance for Bystolic approved through 03/2021. Xarelto cost improved through [employer] . Patient self care activities - Over the next 90 days, patient will: o Check BP 3-5 times weekly, document, and provide at future appointments o Ensure daily salt intake < 2300 mg/day  Hyperlipidemia/Peripheral artery disease Lab Results  Component Value Date/Time   LDLCALC 64 06/04/2019 11:22 AM   LDLDIRECT 160.2 03/01/2010 10:01 AM .  Pharmacist Clinical Goal(s): o Over the next 90 days, patient will work with PharmD and providers to maintain LDL goal < 70 . Current regimen:  o Pravastatin 40 mg daily at bedtime o Ezetimibe 10 mg daily at bedtime . Interventions: o Discussed cholesterol goals and benefits of medications for prevention of heart attack / stroke . Patient self care activities - Over the next 90 days, patient will: o Take cholesterol lowering medications as prescribed at bedtime  Diabetes Lab Results  Component Value Date/Time   HGBA1C 7.3 (A)  12/17/2019 10:46 AM   HGBA1C 6.3 (A) 09/03/2019 11:14 AM   HGBA1C 7.3 (H) 06/04/2019 11:22 AM   HGBA1C 8.4 (H) 01/08/2019 11:34 AM .  Pharmacist Clinical Goal(s): o Over the next 90 days, patient will work with PharmD and providers to maintain A1c goal <7% . Current regimen:  o Synjardy XR 12.08-998 mg once daily o Basaglar 60 units at bedtime o Humalog 10 units 3 times daily with meals o Ozempic 1 mg weekly . Interventions: o Discussed insulin resistance and difficulties with weight loss o Switched Victoza to [employer] for greater weight loss benefit o Patient assistance for all DM medications approved . Patient self care activities - Over the next 90 days, patient will: o Check blood sugar twice daily, in the morning before eating or drinking, and at bedtime, document, and provide at future appointments o Contact provider with any episodes of hypoglycemia  Medication management . Pharmacist Clinical Goal(s): o Over the next 90 days, patient will work with PharmD and providers to achieve optimal medication adherence and reduce medication costs . Current pharmacy: [facility] . Interventions o Comprehensive medication review performed. o Utilize [facility] pharmacy for medication synchronization, packaging and delivery . Patient self care activities - Over the next 90 days, patient will: o Focus on medication adherence by pill pack o Take medications as prescribed o Report any questions or concerns to PharmD and/or provider(s)  Please see past updates related to this goal by clicking on the "Past Updates" button in the selected goal         The  patient verbalized understanding of instructions, educational materials, and care plan provided today and declined offer to receive copy of patient instructions, educational materials, and care plan.  Telephone follow up appointment with pharmacy team member scheduled for: 3 months  Charlene Brooke, PharmD, [facility] Clinical Pharmacist [place]  Primary Care at [facility] [phone]

## 2020-04-21 ENCOUNTER — Telehealth: Payer: Self-pay | Admitting: Pharmacist

## 2020-04-21 NOTE — Progress Notes (Signed)
Chronic Care Management Pharmacy Assistant   Name: Theresa Harrington  MRN: [id number] DOB: 09/30/1955  Reason for Encounter: Medication Review   PCP : [name], MD  Allergies:   Allergies  Allergen Reactions  . Amlodipine Swelling  . Atorvastatin Other (See Comments)    Muscle aches   . Ampicillin Nausea And Vomiting and Other (See Comments)  . Codeine Nausea And Vomiting    Ask patient  . Lisinopril Cough       . Penicillins Nausea And Vomiting    Did it involve swelling of the face/tongue/throat, SOB, or low BP? No Did it involve sudden or severe rash/hives, skin peeling, or any reaction on the inside of your mouth or nose? No Did you need to seek medical attention at a hospital or doctor's office? No When did it last happen?20+ years If all above answers are "NO", may proceed with cephalosporin use.   Theresa Harrington [Sitagliptin] Nausea And Vomiting    Medications: Outpatient Encounter Medications as of 04/21/2020  Medication Sig Note  . Blood Glucose Monitoring Suppl (ONETOUCH VERIO IQ SYSTEM) w/Device KIT USE TO CHECK SUGAR DAILY   . cetirizine (ZYRTEC) 10 MG tablet Take 10 mg by mouth daily as needed for allergies.   [name] dorzolamide-timolol (COSOPT) 22.3-6.8 MG/ML ophthalmic solution Place 1 drop into the right eye 2 (two) times daily.    . Empagliflozin-metFORMIN HCl ER (SYNJARDY XR) 12.08-998 MG TB24 Take 1 tablet by mouth daily.   [name] ezetimibe (ZETIA) 10 MG tablet TAKE ONE TABLET BY MOUTH EVERYDAY AT BEDTIME (Patient taking differently: Take 10 mg by mouth at bedtime.)   . Glucagon (BAQSIMI TWO PACK) 3 MG/DOSE POWD Place 1 Act into the nose once as needed for up to 1 dose.   [name] glucose blood test strip Use to check blood sugar twice daily. DX: E11.8   . Insulin Glargine (BASAGLAR KWIKPEN) 100 UNIT/ML Inject 0.3 mLs (30 Units total) into the skin daily. (Patient taking differently: Inject 50 Units into the skin at bedtime.)   . insulin lispro (HUMALOG) 100 UNIT/ML  injection Inject 14-20 Units into the skin 3 (three) times daily before meals.    . Insulin Pen Needle (PEN NEEDLES) 32G X 4 MM MISC Inject 1 pen as directed 4 (four) times daily. Use to inject levemir or novolog as directed.   . latanoprost (XALATAN) 0.005 % ophthalmic solution Place 1 drop into both eyes at bedtime.    [name] losartan-hydrochlorothiazide (HYZAAR) 100-12.5 MG tablet TAKE ONE TABLET BY MOUTH EVERYDAY AT BEDTIME (Patient taking differently: Take 1 tablet by mouth daily.)   . magnesium oxide (MAG-OX) 400 MG tablet TAKE ONE TABLET BY MOUTH EVERYDAY AT BEDTIME (Patient taking differently: Take 400 mg by mouth at bedtime.)   . mupirocin ointment (BACTROBAN) 2 % Apply 1 application topically daily.    . nebivolol (BYSTOLIC) 5 MG tablet Take 1 tablet (5 mg total) by mouth daily.   [name] omeprazole (PRILOSEC) 40 MG capsule TAKE ONE CAPSULE BY MOUTH EVERY MORNING and TAKE ONE CAPSULE BY MOUTH EVERY EVENING (Patient taking differently: Take 40 mg by mouth daily.)   . potassium chloride SA (KLOR-CON) 20 MEQ tablet TAKE ONE TABLET BY MOUTH EVERY MORNING   . pravastatin (PRAVACHOL) 40 MG tablet TAKE ONE TABLET BY MOUTH EVERYDAY AT BEDTIME (Patient taking differently: Take 40 mg by mouth at bedtime.)   . rivaroxaban (XARELTO) 20 MG TABS tablet Take 1 tablet (20 mg total) by mouth daily.   Theresa Harrington,  1 MG/DOSE, (OZEMPIC, 1 MG/DOSE,) 4 MG/3ML SOPN Inject 0.75 mLs (1 mg total) into the skin once a week. (Patient taking differently: Inject 1 mg into the skin every Friday.)   . thiamine 100 MG tablet Take 1 tablet (100 mg total) by mouth every other day.   . Vilazodone HCl (VIIBRYD STARTER PACK) 10 & 20 MG KIT Take 1 tablet by mouth daily. (Patient not taking: Reported on 04/19/2020) 03/03/2020: Has not started yet  . [DISCONTINUED] ezetimibe (ZETIA) 10 MG tablet Take 1 tablet (10 mg total) by mouth at bedtime.   . [DISCONTINUED] losartan-hydrochlorothiazide (HYZAAR) 100-12.5 MG tablet Take 1 tablet by mouth  daily.   . [DISCONTINUED] magnesium oxide (MAG-OX) 400 MG tablet Take 1 tablet (400 mg total) by mouth at bedtime.   . [DISCONTINUED] omeprazole (PRILOSEC) 40 MG capsule Take 1 capsule (40 mg total) by mouth 2 (two) times daily before a meal.   . [DISCONTINUED] pravastatin (PRAVACHOL) 40 MG tablet Take 1 tablet (40 mg total) by mouth daily.    No facility-administered encounter medications on file as of 04/21/2020.    Current Diagnosis: Patient Active Problem List   Diagnosis Date Noted  . UTI (urinary tract infection) 01/21/2020  . Lumbar strain, initial encounter 01/07/2020  . Amputated toe of left foot ([place]) 01/06/2020  . DM (diabetes mellitus) type II, controlled, with peripheral vascular disorder ([place]) 01/06/2020  . Hirsutism 01/06/2020  . Mitral valve stenosis   . Diabetes mellitus ([place]) 12/17/2019  . Atrial fibrillation ([place])   . Pressure injury of right foot, stage 1 09/18/2019  . UTI due to extended-spectrum beta lactamase (ESBL) producing Escherichia coli 01/11/2019  . Gastroesophageal reflux disease with esophagitis 12/10/2018  . Barrett's esophagus with high grade dysplasia 11/06/2018  . Current use of long term anticoagulation 11/06/2018  . Diuretic-induced hypokalemia 10/08/2018  . Midline low back pain without sciatica 08/14/2018  . Thiamine deficiency 11/05/2017  . Atrial fibrillation with RVR ([place]) 11/08/2016  . Chronic idiopathic constipation 06/05/2016  . Morbid obesity due to excess calories ([place]) 08/04/2015  . Routine general medical examination at a health care facility 01/28/2015  . Visit for screening mammogram 01/27/2015  . Screening for cervical cancer 01/27/2015  . Kidney stone on right side 01/27/2015  . Atherosclerosis of native arteries of the extremities with ulceration ([place]) 11/14/2013  . Anemia, iron deficiency 10/28/2013  . Vitamin B12 deficiency neuropathy ([place]) 10/28/2013  . 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  10/09/2013  . GERD (gastroesophageal reflux disease) 08/06/2012  . Allergic rhinitis, cause unspecified 07/04/2012  . Low back pain radiating to both legs 07/04/2012  . GOITER, MULTINODULAR 11/04/2008  . Uncontrolled type 2 diabetes mellitus with peripheral artery disease ([place]) 08/05/2008  . Hyperlipidemia with target LDL less than 100 08/05/2008  . Essential hypertension, benign 08/05/2008    Goals Addressed   None       BP Readings from Last 3 Encounters:  03/15/20 123/65  03/09/20 (!) 115/57  03/08/20 (!) 149/72    Lab Results  Component Value Date   HGBA1C 7.3 (A) 12/17/2019     Patient obtains medications through Adherence Packaging  90 Days   Last adherence delivery included (date:01/28/20) (medication name and frequency) Ezetimibe 10 mg 1 tab by mouth at bedtime 01/28/20 Losart-hydrochlorothiazide 100-12.5 mg 1 tab by mouth at bedtime 01/28/20 Magnesium Oxide 400 mg 1 tab by mouth at bedtime 01/28/20 Omeprazole 40 mg 1 cap by mouth every morning and every evening 01/28/20 Potassium Cl Er 20  meq 1 tab by mouth every morning 01/28/20 Pravastatin 40 mg 1 tab by mouth every day at bedtime 01/28/20 Vitamin B-1 100 mg one tab every day at bedtime 01/28/20   Patient is due for next adherence delivery on: 05/04/2020. Called patient and reviewed medication needs.  Patient declined need for refills at this time. Patient is aware of how to contact pharmacist if refills are needed prior to next adherence delivery.   Wendy Poet, Clinical Pharmacist Assistant [facility]

## 2020-04-22 ENCOUNTER — Encounter: Payer: Self-pay | Admitting: Internal Medicine

## 2020-04-22 ENCOUNTER — Ambulatory Visit (INDEPENDENT_AMBULATORY_CARE_PROVIDER_SITE_OTHER): Payer: PPO | Admitting: Internal Medicine

## 2020-04-22 ENCOUNTER — Telehealth: Payer: PPO

## 2020-04-22 ENCOUNTER — Ambulatory Visit: Payer: PPO | Admitting: Internal Medicine

## 2020-04-22 ENCOUNTER — Other Ambulatory Visit: Payer: Self-pay

## 2020-04-22 VITALS — BP 142/84 | HR 57 | Temp 97.9°F | Ht 64.0 in | Wt 173.0 lb

## 2020-04-22 DIAGNOSIS — E1151 Type 2 diabetes mellitus with diabetic peripheral angiopathy without gangrene: Secondary | ICD-10-CM | POA: Diagnosis not present

## 2020-04-22 DIAGNOSIS — G63 Polyneuropathy in diseases classified elsewhere: Secondary | ICD-10-CM

## 2020-04-22 DIAGNOSIS — M8949 Other hypertrophic osteoarthropathy, multiple sites: Secondary | ICD-10-CM | POA: Diagnosis not present

## 2020-04-22 DIAGNOSIS — E042 Nontoxic multinodular goiter: Secondary | ICD-10-CM | POA: Diagnosis not present

## 2020-04-22 DIAGNOSIS — M159 Polyosteoarthritis, unspecified: Secondary | ICD-10-CM | POA: Insufficient documentation

## 2020-04-22 DIAGNOSIS — E538 Deficiency of other specified B group vitamins: Secondary | ICD-10-CM

## 2020-04-22 DIAGNOSIS — M5416 Radiculopathy, lumbar region: Secondary | ICD-10-CM

## 2020-04-22 DIAGNOSIS — E1165 Type 2 diabetes mellitus with hyperglycemia: Secondary | ICD-10-CM

## 2020-04-22 DIAGNOSIS — I10 Essential (primary) hypertension: Secondary | ICD-10-CM

## 2020-04-22 DIAGNOSIS — G8929 Other chronic pain: Secondary | ICD-10-CM | POA: Diagnosis not present

## 2020-04-22 DIAGNOSIS — IMO0002 Reserved for concepts with insufficient information to code with codable children: Secondary | ICD-10-CM

## 2020-04-22 DIAGNOSIS — M5442 Lumbago with sciatica, left side: Secondary | ICD-10-CM

## 2020-04-22 LAB — URINALYSIS, ROUTINE W REFLEX MICROSCOPIC
Bilirubin Urine: NEGATIVE
Hgb urine dipstick: NEGATIVE
Ketones, ur: NEGATIVE
Leukocytes,Ua: NEGATIVE
Nitrite: NEGATIVE
RBC / HPF: NONE SEEN (ref 0–?)
Specific Gravity, Urine: >=1.03 — AB (ref 1.000–1.030)
Total Protein, Urine: NEGATIVE
Urine Glucose: 500 — AB
Urobilinogen, UA: 0.2 (ref 0.0–1.0)
pH: 5.5 (ref 5.0–8.0)

## 2020-04-22 LAB — CBC WITH DIFFERENTIAL/PLATELET
Basophils Absolute: 0 10*3/uL (ref 0.0–0.1)
Basophils Relative: 0.5 % (ref 0.0–3.0)
Eosinophils Absolute: 0.2 10*3/uL (ref 0.0–0.7)
Eosinophils Relative: 1.8 % (ref 0.0–5.0)
HCT: 41.5 % (ref 36.0–46.0)
Hemoglobin: 13.9 g/dL (ref 12.0–15.0)
Lymphocytes Relative: 31.2 % (ref 12.0–46.0)
Lymphs Abs: 2.6 10*3/uL (ref 0.7–4.0)
MCHC: 33.5 g/dL (ref 30.0–36.0)
MCV: 87.4 fl (ref 78.0–100.0)
Monocytes Absolute: 0.5 10*3/uL (ref 0.1–1.0)
Monocytes Relative: 6.5 % (ref 3.0–12.0)
Neutro Abs: 5 10*3/uL (ref 1.4–7.7)
Neutrophils Relative %: 60 % (ref 43.0–77.0)
Platelets: 239 10*3/uL (ref 150.0–400.0)
RBC: 4.75 Mil/uL (ref 3.87–5.11)
RDW: 14.2 % (ref 11.5–15.5)
WBC: 8.4 10*3/uL (ref 4.0–10.5)

## 2020-04-22 LAB — HEMOGLOBIN A1C: Hgb A1c MFr Bld: 7.3 % — ABNORMAL HIGH (ref 4.6–6.5)

## 2020-04-22 LAB — BASIC METABOLIC PANEL
BUN: 9 mg/dL (ref 6–23)
CO2: 31 mEq/L (ref 19–32)
Calcium: 9.7 mg/dL (ref 8.4–10.5)
Chloride: 103 mEq/L (ref 96–112)
Creatinine, Ser: 0.8 mg/dL (ref 0.40–1.20)
GFR: 78.56 mL/min (ref >60.00)
Glucose, Bld: 80 mg/dL (ref 70–99)
Potassium: 3.8 mEq/L (ref 3.5–5.1)
Sodium: 140 mEq/L (ref 135–145)

## 2020-04-22 LAB — MICROALBUMIN / CREATININE URINE RATIO
Creatinine,U: 153.3 mg/dL
Microalb Creat Ratio: 1.1 mg/g (ref 0.0–30.0)
Microalb, Ur: 1.7 mg/dL (ref 0.0–1.9)

## 2020-04-22 LAB — TSH: TSH: 0.39 u[IU]/mL (ref 0.35–4.50)

## 2020-04-22 MED ORDER — TRAMADOL HCL 50 MG PO TABS: 50.0000 mg | ORAL_TABLET | Freq: Four times a day (QID) | ORAL | 3 refills | Status: AC | PRN

## 2020-04-22 MED ORDER — CYANOCOBALAMIN 1000 MCG/ML IJ SOLN
1000.0000 ug | Freq: Once | INTRAMUSCULAR | Status: AC
  Administered 2020-04-22: 10:00:00 1000 ug via INTRAMUSCULAR
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
## 2020-04-22 NOTE — Patient Instructions (Signed)
Acute Back Pain, Adult Acute back pain is sudden and usually short-lived. It is often caused by an injury to the muscles and tissues in the back. The injury may result from:  A muscle or ligament getting overstretched or torn (strained). Ligaments are tissues that connect bones to each other. Lifting something improperly can cause a back strain.  Wear and tear (degeneration) of the spinal disks. Spinal disks are circular tissue that provides cushioning between the bones of the spine (vertebrae).  Twisting motions, such as while playing sports or doing yard work.  A hit to the back.  Arthritis. You may have a physical exam, lab tests, and imaging tests to find the cause of your pain. Acute back pain usually goes away with rest and home care. Follow these instructions at home: Managing pain, stiffness, and swelling  Take over-the-counter and prescription medicines only as told by your health care provider.  Your health care provider may recommend applying ice during the first 24-48 hours after your pain starts. To do this: ? Put ice in a plastic bag. ? Place a towel between your skin and the bag. ? Leave the ice on for 20 minutes, 2-3 times a day.  If directed, apply heat to the affected area as often as told by your health care provider. Use the heat source that your health care provider recommends, such as a moist heat pack or a heating pad. ? Place a towel between your skin and the heat source. ? Leave the heat on for 20-30 minutes. ? Remove the heat if your skin turns bright red. This is especially important if you are unable to feel pain, heat, or cold. You have a greater risk of getting burned. Activity   Do not stay in bed. Staying in bed for more than 1-2 days can delay your recovery.  Sit up and stand up straight. Avoid leaning forward when you sit, or hunching over when you stand. ? If you work at a desk, sit close to it so you do not need to lean over. Keep your chin tucked  in. Keep your neck drawn back, and keep your elbows bent at a right angle. Your arms should look like the letter "L." ? Sit high and close to the steering wheel when you drive. Add lower back (lumbar) support to your car seat, if needed.  Take short walks on even surfaces as soon as you are able. Try to increase the length of time you walk each day.  Do not sit, drive, or stand in one place for more than 30 minutes at a time. Sitting or standing for long periods of time can put stress on your back.  Do not drive or use heavy machinery while taking prescription pain medicine.  Use proper lifting techniques. When you bend and lift, use positions that put less stress on your back: ? Bend your knees. ? Keep the load close to your body. ? Avoid twisting.  Exercise regularly as told by your health care provider. Exercising helps your back heal faster and helps prevent back injuries by keeping muscles strong and flexible.  Work with a physical therapist to make a safe exercise program, as recommended by your health care provider. Do any exercises as told by your physical therapist. Lifestyle  Maintain a healthy weight. Extra weight puts stress on your back and makes it difficult to have good posture.  Avoid activities or situations that make you feel anxious or stressed. Stress and anxiety increase muscle   tension and can make back pain worse. Learn ways to manage anxiety and stress, such as through exercise. General instructions  Sleep on a firm mattress in a comfortable position. Try lying on your side with your knees slightly bent. If you lie on your back, put a pillow under your knees.  Follow your treatment plan as told by your health care provider. This may include: ? Cognitive or behavioral therapy. ? Acupuncture or massage therapy. ? Meditation or yoga. Contact a health care provider if:  You have pain that is not relieved with rest or medicine.  You have increasing pain going down  into your legs or buttocks.  Your pain does not improve after 2 weeks.  You have pain at night.  You lose weight without trying.  You have a fever or chills. Get help right away if:  You develop new bowel or bladder control problems.  You have unusual weakness or numbness in your arms or legs.  You develop nausea or vomiting.  You develop abdominal pain.  You feel faint. Summary  Acute back pain is sudden and usually short-lived.  Use proper lifting techniques. When you bend and lift, use positions that put less stress on your back.  Take over-the-counter and prescription medicines and apply heat or ice as directed by your health care provider. This information is not intended to replace advice given to you by your health care provider. Make sure you discuss any questions you have with your health care provider. Document Revised: 08/06/2018 Document Reviewed: 11/29/2016 Elsevier Patient Education  2020 [employer]  

## 2020-04-28 ENCOUNTER — Telehealth: Payer: Self-pay | Admitting: Internal Medicine

## 2020-04-28 NOTE — Telephone Encounter (Signed)
Patient dropped off medication assistance forms.   Put in Lindsey's office.   Patient would like the original copy mailed to her once they are completed.  Best contact number: [phone]

## 2020-04-29 ENCOUNTER — Telehealth: Payer: Self-pay | Admitting: Pharmacist

## 2020-04-29 NOTE — Progress Notes (Addendum)
Chronic Care Management Pharmacy Assistant   Name: Theresa Harrington  MRN: [id number] DOB: [birth year]  Reason for Encounter: Medication Review  Patient Questions:  1.  Have you seen any other providers since your last visit? The patient last see Dr [name] on 04/22/20 for low back pain.  2.  Any changes in your medicines or health? Yes, Dr. [name] added tramadol 50 mg on 04/22/20    PCP : [name], MD  Allergies:   Allergies  Allergen Reactions   Amlodipine Swelling   Atorvastatin Other (See Comments)    Muscle aches    Ampicillin Nausea And Vomiting and Other (See Comments)   Codeine Nausea And Vomiting    Ask patient   Lisinopril Cough        Penicillins Nausea And Vomiting    Did it involve swelling of the face/tongue/throat, SOB, or low BP? No Did it involve sudden or severe rash/hives, skin peeling, or any reaction on the inside of your mouth or nose? No Did you need to seek medical attention at a hospital or doctor's office? No When did it last happen?      20+ years If all above answers are "NO", may proceed with cephalosporin use.    Januvia [Sitagliptin] Nausea And Vomiting    Medications: Outpatient Encounter Medications as of 04/29/2020  Medication Sig Note   Blood Glucose Monitoring Suppl (ONETOUCH VERIO IQ SYSTEM) w/Device KIT USE TO CHECK SUGAR DAILY    cetirizine (ZYRTEC) 10 MG tablet Take 10 mg by mouth daily as needed for allergies.    dorzolamide-timolol (COSOPT) 22.3-6.8 MG/ML ophthalmic solution Place 1 drop into the right eye 2 (two) times daily.     Empagliflozin-metFORMIN HCl ER (SYNJARDY XR) 12.08-998 MG TB24 Take 1 tablet by mouth daily.    ezetimibe (ZETIA) 10 MG tablet TAKE ONE TABLET BY MOUTH EVERYDAY AT BEDTIME (Patient taking differently: Take 10 mg by mouth at bedtime.)    Glucagon (BAQSIMI TWO PACK) 3 MG/DOSE POWD Place 1 Act into the nose once as needed for up to 1 dose.    glucose blood test strip Use to check blood sugar twice daily.  DX: E11.8    Insulin Glargine (BASAGLAR KWIKPEN) 100 UNIT/ML Inject 0.3 mLs (30 Units total) into the skin daily. (Patient taking differently: Inject 50 Units into the skin at bedtime.)    insulin lispro (HUMALOG) 100 UNIT/ML injection Inject 14-20 Units into the skin 3 (three) times daily before meals.     Insulin Pen Needle (PEN NEEDLES) 32G X 4 MM MISC Inject 1 pen as directed 4 (four) times daily. Use to inject levemir or novolog as directed.    latanoprost (XALATAN) 0.005 % ophthalmic solution Place 1 drop into both eyes at bedtime.     losartan-hydrochlorothiazide (HYZAAR) 100-12.5 MG tablet TAKE ONE TABLET BY MOUTH EVERYDAY AT BEDTIME (Patient taking differently: Take 1 tablet by mouth daily.)    magnesium oxide (MAG-OX) 400 MG tablet TAKE ONE TABLET BY MOUTH EVERYDAY AT BEDTIME (Patient taking differently: Take 400 mg by mouth at bedtime.)    mupirocin ointment (BACTROBAN) 2 % Apply 1 application topically daily.     nebivolol (BYSTOLIC) 5 MG tablet Take 1 tablet (5 mg total) by mouth daily.    omeprazole (PRILOSEC) 40 MG capsule TAKE ONE CAPSULE BY MOUTH EVERY MORNING and TAKE ONE CAPSULE BY MOUTH EVERY EVENING (Patient taking differently: Take 40 mg by mouth daily.)    potassium chloride SA (KLOR-CON) 20 MEQ tablet TAKE  ONE TABLET BY MOUTH EVERY MORNING    pravastatin (PRAVACHOL) 40 MG tablet TAKE ONE TABLET BY MOUTH EVERYDAY AT BEDTIME (Patient taking differently: Take 40 mg by mouth at bedtime.)    rivaroxaban (XARELTO) 20 MG TABS tablet Take 1 tablet (20 mg total) by mouth daily.    Semaglutide, 1 MG/DOSE, (OZEMPIC, 1 MG/DOSE,) 4 MG/3ML SOPN Inject 0.75 mLs (1 mg total) into the skin once a week. (Patient taking differently: Inject 1 mg into the skin every Friday.)    thiamine 100 MG tablet Take 1 tablet (100 mg total) by mouth every other day.    traMADol (ULTRAM) 50 MG tablet Take 1 tablet (50 mg total) by mouth every 6 (six) hours as needed.    Vilazodone HCl (VIIBRYD STARTER PACK) 10  & 20 MG KIT Take 1 tablet by mouth daily. 03/03/2020: Has not started yet   No facility-administered encounter medications on file as of 04/29/2020.    Current Diagnosis: Patient Active Problem List   Diagnosis Date Noted   Primary osteoarthritis involving multiple joints 04/22/2020   Left lumbar radiculitis 04/22/2020   Chronic left-sided low back pain with left-sided sciatica 04/22/2020   UTI (urinary tract infection) 01/21/2020   Lumbar strain, initial encounter 01/07/2020   Amputated toe of left foot ([place]) 01/06/2020   DM (diabetes mellitus) type II, controlled, with peripheral vascular disorder ([place]) 01/06/2020   Hirsutism 01/06/2020   Mitral valve stenosis    Diabetes mellitus ([place]) 12/17/2019   Atrial fibrillation (HCC)    Pressure injury of right foot, stage 1 09/18/2019   UTI due to extended-spectrum beta lactamase (ESBL) producing Escherichia coli 01/11/2019   Gastroesophageal reflux disease with esophagitis 12/10/2018   Barrett's esophagus with high grade dysplasia 11/06/2018   Current use of long term anticoagulation 11/06/2018   Diuretic-induced hypokalemia 10/08/2018   Midline low back pain without sciatica 08/14/2018   Thiamine deficiency 11/05/2017   Atrial fibrillation with RVR ([place]) 11/08/2016   Chronic idiopathic constipation 06/05/2016   Morbid obesity due to excess calories ([place]) 08/04/2015   Routine general medical examination at a health care facility 01/28/2015   Visit for screening mammogram 01/27/2015   Screening for cervical cancer 01/27/2015   Kidney stone on right side 01/27/2015   Atherosclerosis of native arteries of the extremities with ulceration ([place]) 11/14/2013   Anemia, iron deficiency 10/28/2013   Vitamin B12 deficiency neuropathy ([place]) 10/28/2013   History of laparoscopic adjustable gastric banding, 05/29/2005. 10/09/2013   GERD (gastroesophageal reflux disease) 08/06/2012   Allergic rhinitis, cause unspecified 07/04/2012   Low back pain  radiating to both legs 07/04/2012   GOITER, MULTINODULAR 11/04/2008   Uncontrolled type 2 diabetes mellitus with peripheral artery disease ([place]) 08/05/2008   Hyperlipidemia with target LDL less than 100 08/05/2008   Essential hypertension, benign 08/05/2008    Goals Addressed   None     Follow-Up:  Coordination of Enhanced Pharmacy Services   Reviewed chart for medication changes ahead of medication coordination call.  No OVs, Consults, or hospital visits since last care coordination call/Pharmacist visit. No medication changes indicated   BP Readings from Last 3 Encounters:  04/22/20 (!) 142/84  03/15/20 123/65  03/09/20 (!) 115/57    Lab Results  Component Value Date   HGBA1C 7.3 (H) 04/22/2020     Patient obtains medications through Adherence Packaging  90 Days   Last adherence delivery included:  Ezetimibe 10 mg 1 tab by mouth at bedtime 01/28/20 Losart-hydrochlorothiazide 100-12.5 mg 1 tab by mouth  at bedtime 01/28/20 Magnesium Oxide 400 mg 1 tab by mouth at bedtime 01/28/20 Omeprazole 40 mg 1 cap by mouth every morning and every evening 01/28/20 Potassium Cl Er 20 meq 1 tab by mouth every morning 01/28/20 Pravastatin 40 mg 1 tab by mouth every day at bedtime 01/28/20 Vitamin B-1 100 mg one tab every day at bedtime 01/28/20    Patient is due for next adherence delivery on: 05/04/2020. Called patient and reviewed medications and coordinated delivery.  This delivery to include: Ezetimibe 10 mg 1 tab by mouth at bedtime  Losart-hydrochlorothiazide 100-12.5 mg 1 tab by mouth at bedtime  Magnesium Oxide 400 mg 1 tab by mouth at bedtime  Omeprazole 40 mg 1 cap by mouth every morning and every evening  Potassium Cl Er 20 meq 1 tab by mouth every morning  Pravastatin 40 mg 1 tab by mouth every day at bedtime Vitamin B-1 100 mg one tab every day at bedtime      Confirmed delivery date of 05/04/2020, advised patient that pharmacy will contact them the morning of  delivery.   Wendy Poet, Clinical Pharmacist Assistant [facility]  7 minutes spent in review, coordination, and documentation. Doristine Section Clinical Pharmacist [facility]  [phone]

## 2020-05-03 NOTE — Telephone Encounter (Signed)
Patient has HTA insurance and reports copay for [employer], Hospital doctor, and Humalog are cost prohibitive at this time.  Reviewed application process for patient assistance programs. Patient meets income/out of pocket spend criteria for the program. Patient completed the patient sections of each application.[name] Collaborated with Dr Yetta Barre for the provider portion of application.   Completed applications sent via Fax today  Kathyrn Sheriff, [facility]

## 2020-05-05 ENCOUNTER — Other Ambulatory Visit: Payer: Self-pay

## 2020-05-05 ENCOUNTER — Ambulatory Visit
Admission: RE | Admit: 2020-05-05 | Discharge: 2020-05-05 | Disposition: A | Discharge status: Home / Self Care | Payer: PPO | Source: Ambulatory Visit | Attending: Internal Medicine | Admitting: Internal Medicine

## 2020-05-05 DIAGNOSIS — M545 Low back pain, unspecified: Secondary | ICD-10-CM | POA: Diagnosis not present

## 2020-05-05 DIAGNOSIS — M5416 Radiculopathy, lumbar region: Secondary | ICD-10-CM

## 2020-05-05 DIAGNOSIS — M48061 Spinal stenosis, lumbar region without neurogenic claudication: Secondary | ICD-10-CM | POA: Diagnosis not present

## 2020-05-05 DIAGNOSIS — M5442 Lumbago with sciatica, left side: Secondary | ICD-10-CM

## 2020-05-05 DIAGNOSIS — G8929 Other chronic pain: Secondary | ICD-10-CM

## 2020-05-06 ENCOUNTER — Other Ambulatory Visit: Payer: Self-pay | Admitting: Internal Medicine

## 2020-05-06 DIAGNOSIS — M48062 Spinal stenosis, lumbar region with neurogenic claudication: Secondary | ICD-10-CM

## 2020-05-13 DIAGNOSIS — Z6830 Body mass index (BMI) 30.0-30.9, adult: Secondary | ICD-10-CM | POA: Diagnosis not present

## 2020-05-13 DIAGNOSIS — M4316 Spondylolisthesis, lumbar region: Secondary | ICD-10-CM | POA: Diagnosis not present

## 2020-05-13 DIAGNOSIS — I10 Essential (primary) hypertension: Secondary | ICD-10-CM | POA: Diagnosis not present

## 2020-05-18 ENCOUNTER — Telehealth: Payer: Self-pay | Admitting: Pharmacist

## 2020-05-18 NOTE — Progress Notes (Signed)
[employer] returned my call they stated that they did receive her application but they are unable to process it due to missing proof of income.   Spoke with the patient she stated that she will have to bring a pay stub to the office but she will not be abl to do that this week. I informed the patient that she can bring it whenever she is able to so that way, we can fax it to [facility] so they are able to process her application.  The patient voiced an understanding.   Rosendo Gros, [facility]  Practice Team Manager/ CPA (Clinical Pharmacist Assistant) [phone]

## 2020-05-20 NOTE — Telephone Encounter (Addendum)
Received patient's proof of income. Attached to [employer] and [employer] and faxed to companies as requested.  20 minutes spent this month in review, coordination of patient assistance, and documentation.  Charlton Haws, [facility] 05/20/20 12:36 PM

## 2020-05-24 ENCOUNTER — Telehealth: Payer: Self-pay | Admitting: Pharmacist

## 2020-05-25 NOTE — Progress Notes (Signed)
05/24/2020 --The patient called stating that she got a letter in the mail from [facility] with an application and was wondering where she will find her Medicare Part D ID number. I informed the patient after speaking with Mendel Ryder that she does not have to worry about completing the application or filling out her INS information. Mendel Ryder says that she has everything she needs and her she faxed the patients paper work on 05/20/2020.   05/25/2020-- The patient called this morning stating that she heard from the company that is working on her [place] requesting additional information. The patient stated that they are needing her income. I reached out to [place] she stated that she has her income information.  The patient was also inquiring about Ozempic samples since she is waiting for PAP to be approved. Mendel Ryder informed me that the office does not have the samples the patient needs. Called and left a detail message on her phone informing her.   Rosendo Gros, [facility]  Practice Team Manager/ CPA (Clinical Pharmacist Assistant) [phone]

## 2020-05-27 DIAGNOSIS — E1151 Type 2 diabetes mellitus with diabetic peripheral angiopathy without gangrene: Secondary | ICD-10-CM | POA: Diagnosis not present

## 2020-05-27 DIAGNOSIS — S98132A Complete traumatic amputation of one left lesser toe, initial encounter: Secondary | ICD-10-CM | POA: Diagnosis not present

## 2020-05-27 DIAGNOSIS — L89891 Pressure ulcer of other site, stage 1: Secondary | ICD-10-CM | POA: Diagnosis not present

## 2020-05-27 DIAGNOSIS — Z8631 Personal history of diabetic foot ulcer: Secondary | ICD-10-CM | POA: Diagnosis not present

## 2020-06-01 NOTE — Telephone Encounter (Signed)
Received documentation that [employer] and [employer] were approved for 2022 (for insulins and Ozempic, respectively).   [employer] I[place] Boop) - still awaiting response from company.

## 2020-06-03 ENCOUNTER — Ambulatory Visit (INDEPENDENT_AMBULATORY_CARE_PROVIDER_SITE_OTHER): Payer: PPO

## 2020-06-03 VITALS — BP 111/62 | HR 66 | Temp 97.9°F | Ht 64.0 in | Wt 171.0 lb

## 2020-06-03 DIAGNOSIS — Z0000 Encounter for general adult medical examination without abnormal findings: Secondary | ICD-10-CM

## 2020-06-03 NOTE — Progress Notes (Signed)
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 Notes:  Patient is cogitatively intact. Patient provided her own vital signs and cbg reading.

## 2020-06-03 NOTE — Patient Instructions (Signed)
Theresa Harrington , Thank you for taking time to come for your Medicare Wellness Visit. I appreciate your ongoing commitment to your health goals. Please review the following plan we discussed and let me know if I can assist you in the future.   Screening recommendations/referrals: Colonoscopy: 07/11/2018; due every 3 years Mammogram: 05/16/2019; due every year Bone Density: never done Recommended yearly ophthalmology/optometry visit for glaucoma screening and checkup Recommended yearly dental visit for hygiene and checkup  Vaccinations: Influenza vaccine: 01/07/2020 Pneumococcal vaccine: up to date Tdap vaccine: 10/15/2017; due every 10 years Shingles vaccine: up to date  Covid-19: up to date  Advanced directives: Please bring a copy of your health care power of attorney and living will to the office at your convenience.  Conditions/risks identified: Yes; Reviewed health maintenance screenings with patient today and relevant education, vaccines, and/or referrals were provided. Please continue to do your personal lifestyle choices by: daily care of teeth and gums, regular physical activity (goal should be 5 days a week for 30 minutes), eat a healthy diet, avoid tobacco and drug use, limiting any alcohol intake, taking a low-dose aspirin (if not allergic or have been advised by your provider otherwise) and taking vitamins and minerals as recommended by your provider. Continue doing brain stimulating activities (puzzles, reading, adult coloring books, staying active) to keep memory sharp. Continue to eat heart healthy diet (full of fruits, vegetables, whole grains, lean protein, water--limit salt, fat, and sugar intake) and increase physical activity as tolerated.  Next appointment: Please schedule your next Medicare Wellness Visit with your Nurse Health Advisor in 1 year by calling [phone].  Preventive Care 40-64 Years, Female Preventive care refers to lifestyle choices and visits with your health  care provider that can promote health and wellness. What does preventive care include?  A yearly physical exam. This is also called an annual well check.  Dental exams once or twice a year.  Routine eye exams. Ask your health care provider how often you should have your eyes checked.  Personal lifestyle choices, including:  Daily care of your teeth and gums.  Regular physical activity.  Eating a healthy diet.  Avoiding tobacco and drug use.  Limiting alcohol use.  Practicing safe sex.  Taking low-dose aspirin daily starting at age 51.  Taking vitamin and mineral supplements as recommended by your health care provider. What happens during an annual well check? The services and screenings done by your health care provider during your annual well check will depend on your age, overall health, lifestyle risk factors, and family history of disease. Counseling  Your health care provider may ask you questions about your:  Alcohol use.  Tobacco use.  Drug use.  Emotional well-being.  Home and relationship well-being.  Sexual activity.  Eating habits.  Work and work Statistician.  Method of birth control.  Menstrual cycle.  Pregnancy history. Screening  You may have the following tests or measurements:  Height, weight, and BMI.  Blood pressure.  Lipid and cholesterol levels. These may be checked every 5 years, or more frequently if you are over 15 years old.  Skin check.  Lung cancer screening. You may have this screening every year starting at age 96 if you have a 30-pack-year history of smoking and currently smoke or have quit within the past 15 years.  Fecal occult blood test (FOBT) of the stool. You may have this test every year starting at age 53.  Flexible sigmoidoscopy or colonoscopy. You may have a sigmoidoscopy every  5 years or a colonoscopy every 10 years starting at age 47.  Hepatitis C blood test.  Hepatitis B blood test.  Sexually  transmitted disease (STD) testing.  Diabetes screening. This is done by checking your blood sugar (glucose) after you have not eaten for a while (fasting). You may have this done every 1-3 years.  Mammogram. This may be done every 1-2 years. Talk to your health care provider about when you should start having regular mammograms. This may depend on whether you have a family history of breast cancer.  BRCA-related cancer screening. This may be done if you have a family history of breast, ovarian, tubal, or peritoneal cancers.  Pelvic exam and Pap test. This may be done every 3 years starting at age 51. Starting at age 39, this may be done every 5 years if you have a Pap test in combination with an HPV test.  Bone density scan. This is done to screen for osteoporosis. You may have this scan if you are at high risk for osteoporosis. Discuss your test results, treatment options, and if necessary, the need for more tests with your health care provider. Vaccines  Your health care provider may recommend certain vaccines, such as:  Influenza vaccine. This is recommended every year.  Tetanus, diphtheria, and acellular pertussis (Tdap, Td) vaccine. You may need a Td booster every 10 years.  Zoster vaccine. You may need this after age 10.  Pneumococcal 13-valent conjugate (PCV13) vaccine. You may need this if you have certain conditions and were not previously vaccinated.  Pneumococcal polysaccharide (PPSV23) vaccine. You may need one or two doses if you smoke cigarettes or if you have certain conditions. Talk to your health care provider about which screenings and vaccines you need and how often you need them. This information is not intended to replace advice given to you by your health care provider. Make sure you discuss any questions you have with your health care provider. Document Released: 05/14/2015 Document Revised: 01/05/2016 Document Reviewed: 02/16/2015 Elsevier Interactive Patient  Education  2017 [place] Prevention in the Home Falls can cause injuries. They can happen to people of all ages. There are many things you can do to make your home safe and to help prevent falls. What can I do on the outside of my home?  Regularly fix the edges of walkways and driveways and fix any cracks.  Remove anything that might make you trip as you walk through a door, such as a raised step or threshold.  Trim any bushes or trees on the path to your home.  Use bright outdoor lighting.  Clear any walking paths of anything that might make someone trip, such as rocks or tools.  Regularly check to see if handrails are loose or broken. Make sure that both sides of any steps have handrails.  Any raised decks and porches should have guardrails on the edges.  Have any leaves, snow, or ice cleared regularly.  Use sand or salt on walking paths during winter.  Clean up any spills in your garage right away. This includes oil or grease spills. What can I do in the bathroom?  Use night lights.  Install grab bars by the toilet and in the tub and shower. Do not use towel bars as grab bars.  Use non-skid mats or decals in the tub or shower.  If you need to sit down in the shower, use a plastic, non-slip stool.  Keep the floor dry.  Clean up any water that spills on the floor as soon as it happens.  Remove soap buildup in the tub or shower regularly.  Attach bath mats securely with double-sided non-slip rug tape.  Do not have throw rugs and other things on the floor that can make you trip. What can I do in the bedroom?  Use night lights.  Make sure that you have a light by your bed that is easy to reach.  Do not use any sheets or blankets that are too big for your bed. They should not hang down onto the floor.  Have a firm chair that has side arms. You can use this for support while you get dressed.  Do not have throw rugs and other things on the floor that can  make you trip. What can I do in the kitchen?  Clean up any spills right away.  Avoid walking on wet floors.  Keep items that you use a lot in easy-to-reach places.  If you need to reach something above you, use a strong step stool that has a grab bar.  Keep electrical cords out of the way.  Do not use floor polish or wax that makes floors slippery. If you must use wax, use non-skid floor wax.  Do not have throw rugs and other things on the floor that can make you trip. What can I do with my stairs?  Do not leave any items on the stairs.  Make sure that there are handrails on both sides of the stairs and use them. Fix handrails that are broken or loose. Make sure that handrails are as long as the stairways.  Check any carpeting to make sure that it is firmly attached to the stairs. Fix any carpet that is loose or worn.  Avoid having throw rugs at the top or bottom of the stairs. If you do have throw rugs, attach them to the floor with carpet tape.  Make sure that you have a light switch at the top of the stairs and the bottom of the stairs. If you do not have them, ask someone to add them for you. What else can I do to help prevent falls?  Wear shoes that:  Do not have high heels.  Have rubber bottoms.  Are comfortable and fit you well.  Are closed at the toe. Do not wear sandals.  If you use a stepladder:  Make sure that it is fully opened. Do not climb a closed stepladder.  Make sure that both sides of the stepladder are locked into place.  Ask someone to hold it for you, if possible.  Clearly mark and make sure that you can see:  Any grab bars or handrails.  First and last steps.  Where the edge of each step is.  Use tools that help you move around (mobility aids) if they are needed. These include:  Canes.  Walkers.  Scooters.  Crutches.  Turn on the lights when you go into a dark area. Replace any light bulbs as soon as they burn out.  Set up your  furniture so you have a clear path. Avoid moving your furniture around.  If any of your floors are uneven, fix them.  If there are any pets around you, be aware of where they are.  Review your medicines with your doctor. Some medicines can make you feel dizzy. This can increase your chance of falling. Ask your doctor what other things that you can do to help prevent  falls. This information is not intended to replace advice given to you by your health care provider. Make sure you discuss any questions you have with your health care provider. Document Released: 02/11/2009 Document Revised: 09/23/2015 Document Reviewed: 05/22/2014 Elsevier Interactive Patient Education  2017 [employer].

## 2020-06-14 ENCOUNTER — Telehealth: Payer: Self-pay

## 2020-06-14 NOTE — Telephone Encounter (Signed)
Pt has been informed that pt assistance (Ozempic) was received and ready for pick up.   Located in [employer] in upstairs lab.

## 2020-06-24 ENCOUNTER — Telehealth: Payer: Self-pay | Admitting: Pharmacist

## 2020-06-29 NOTE — Progress Notes (Signed)
Chronic Care Management Pharmacy Assistant   Name: Theresa Harrington  MRN: [id number] DOB: 1955-11-19  Reason for Encounter: Medication Review  PCP : [name], MD  Allergies:   Allergies  Allergen Reactions  . Amlodipine Swelling  . Atorvastatin Other (See Comments)    Muscle aches   . Ampicillin Nausea And Vomiting and Other (See Comments)  . Codeine Nausea And Vomiting    Ask patient  . Lisinopril Cough       . Penicillins Nausea And Vomiting    Did it involve swelling of the face/tongue/throat, SOB, or low BP? No Did it involve sudden or severe rash/hives, skin peeling, or any reaction on the inside of your mouth or nose? No Did you need to seek medical attention at a hospital or doctor's office? No When did it last happen?20+ years If all above answers are "NO", may proceed with cephalosporin use.   [name] [Sitagliptin] Nausea And Vomiting    Medications: Outpatient Encounter Medications as of 06/24/2020  Medication Sig Note  . Blood Glucose Monitoring Suppl (ONETOUCH VERIO IQ SYSTEM) w/Device KIT USE TO CHECK SUGAR DAILY   . cetirizine (ZYRTEC) 10 MG tablet Take 10 mg by mouth daily as needed for allergies.   [name] dorzolamide-timolol (COSOPT) 22.3-6.8 MG/ML ophthalmic solution Place 1 drop into the right eye 2 (two) times daily.    . Empagliflozin-metFORMIN HCl ER (SYNJARDY XR) 12.08-998 MG TB24 Take 1 tablet by mouth daily.   [name] ezetimibe (ZETIA) 10 MG tablet TAKE ONE TABLET BY MOUTH EVERYDAY AT BEDTIME (Patient taking differently: Take 10 mg by mouth at bedtime.)   . Glucagon (BAQSIMI TWO PACK) 3 MG/DOSE POWD Place 1 Act into the nose once as needed for up to 1 dose.   [name] glucose blood test strip Use to check blood sugar twice daily. DX: E11.8   . Insulin Glargine (BASAGLAR KWIKPEN) 100 UNIT/ML Inject 0.3 mLs (30 Units total) into the skin daily. (Patient taking differently: Inject 50 Units into the skin at bedtime.)   . insulin lispro (HUMALOG) 100 UNIT/ML  injection Inject 14-20 Units into the skin 3 (three) times daily before meals.    . Insulin Pen Needle (PEN NEEDLES) 32G X 4 MM MISC Inject 1 pen as directed 4 (four) times daily. Use to inject levemir or novolog as directed.   . latanoprost (XALATAN) 0.005 % ophthalmic solution Place 1 drop into both eyes at bedtime.    [name] losartan-hydrochlorothiazide (HYZAAR) 100-12.5 MG tablet TAKE ONE TABLET BY MOUTH EVERYDAY AT BEDTIME (Patient taking differently: Take 1 tablet by mouth daily.)   . magnesium oxide (MAG-OX) 400 MG tablet TAKE ONE TABLET BY MOUTH EVERYDAY AT BEDTIME (Patient taking differently: Take 400 mg by mouth at bedtime.)   . mupirocin ointment (BACTROBAN) 2 % Apply 1 application topically daily.    . nebivolol (BYSTOLIC) 5 MG tablet Take 1 tablet (5 mg total) by mouth daily.   [name] omeprazole (PRILOSEC) 40 MG capsule TAKE ONE CAPSULE BY MOUTH EVERY MORNING and TAKE ONE CAPSULE BY MOUTH EVERY EVENING (Patient taking differently: Take 40 mg by mouth daily.)   . potassium chloride SA (KLOR-CON) 20 MEQ tablet TAKE ONE TABLET BY MOUTH EVERY MORNING   . pravastatin (PRAVACHOL) 40 MG tablet TAKE ONE TABLET BY MOUTH EVERYDAY AT BEDTIME (Patient taking differently: Take 40 mg by mouth at bedtime.)   . rivaroxaban (XARELTO) 20 MG TABS tablet Take 1 tablet (20 mg total) by mouth daily.   . Semaglutide, 1  MG/DOSE, (OZEMPIC, 1 MG/DOSE,) 4 MG/3ML SOPN Inject 0.75 mLs (1 mg total) into the skin once a week. (Patient taking differently: Inject 1 mg into the skin every Friday.)   . thiamine 100 MG tablet Take 1 tablet (100 mg total) by mouth every other day.   . traMADol (ULTRAM) 50 MG tablet Take 1 tablet (50 mg total) by mouth every 6 (six) hours as needed.   . Vilazodone HCl (VIIBRYD STARTER PACK) 10 & 20 MG KIT Take 1 tablet by mouth daily. 03/03/2020: Has not started yet   No facility-administered encounter medications on file as of 06/24/2020.    Current Diagnosis: Patient Active Problem List    Diagnosis Date Noted  . Spinal stenosis of lumbar region with neurogenic claudication 05/06/2020  . Primary osteoarthritis involving multiple joints 04/22/2020  . Left lumbar radiculitis 04/22/2020  . Chronic left-sided low back pain with left-sided sciatica 04/22/2020  . UTI (urinary tract infection) 01/21/2020  . Lumbar strain, initial encounter 01/07/2020  . Amputated toe of left foot ([place]) 01/06/2020  . DM (diabetes mellitus) type II, controlled, with peripheral vascular disorder ([place]) 01/06/2020  . Hirsutism 01/06/2020  . Mitral valve stenosis   . Diabetes mellitus ([place]) 12/17/2019  . Atrial fibrillation ([place])   . Pressure injury of right foot, stage 1 09/18/2019  . UTI due to extended-spectrum beta lactamase (ESBL) producing Escherichia coli 01/11/2019  . Gastroesophageal reflux disease with esophagitis 12/10/2018  . Barrett's esophagus with high grade dysplasia 11/06/2018  . Current use of long term anticoagulation 11/06/2018  . Diuretic-induced hypokalemia 10/08/2018  . Midline low back pain without sciatica 08/14/2018  . Thiamine deficiency 11/05/2017  . Atrial fibrillation with RVR ([place]) 11/08/2016  . Chronic idiopathic constipation 06/05/2016  . Morbid obesity due to excess calories ([place]) 08/04/2015  . Routine general medical examination at a health care facility 01/28/2015  . Visit for screening mammogram 01/27/2015  . Screening for cervical cancer 01/27/2015  . Kidney stone on right side 01/27/2015  . Atherosclerosis of native arteries of the extremities with ulceration ([place]) 11/14/2013  . Anemia, iron deficiency 10/28/2013  . Vitamin B12 deficiency neuropathy ([place]) 10/28/2013  . 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 10/09/2013  . GERD (gastroesophageal reflux disease) 08/06/2012  . Allergic rhinitis, cause unspecified 07/04/2012  . Low back pain radiating to both legs 07/04/2012  . GOITER, MULTINODULAR 11/04/2008  . Uncontrolled type 2  diabetes mellitus with peripheral artery disease ([place]) 08/05/2008  . Hyperlipidemia with target LDL less than 100 08/05/2008  . Essential hypertension, benign 08/05/2008    Reviewed chart for medication changes ahead of medication coordination call.  No OVs, Consults, or hospital visits since last care coordination call/Pharmacist visit. (If appropriate, list visit date, provider name)  No medication changes indicated OR if recent visit, treatment plan here.  BP Readings from Last 3 Encounters:  06/03/20 111/62  04/22/20 (!) 142/84  03/15/20 123/65    Lab Results  Component Value Date   HGBA1C 7.3 (H) 04/22/2020     Patient obtains medications through Adherence Packaging  90 Days   Last adherence delivery included: Hydrochlorothiazide 12.10m Tab  Take one tab every day at bedtime Losartan 1077mTab Take one tab by mouth every day at bedtime Vitamin B-1 10025mab Take one tab every day at bedtime Pot Cl Micro 38m51make one tab every morning Pravastatin 40mg65me one tab every day at bedtime Ezetimibe 10mg 70mke one tab every day at bedtime Omeprazole 40mg T48mone  capsule every morning and take one capsule every evening Mag Oxide 435m Take one tab every day at bedtime  This delivery to include : One touch Verio Test Strips- Used to check blood sugar twice daily  Called patient and reviewed medications. Patient is not needing any medications until April . No fill is needed at this time. However patient is needing test strips   JGeorgiana Shore,CRoosevelt GardensPharmacist Assistant [phone] Follow-Up:  Pharmacist Review

## 2020-07-01 DIAGNOSIS — Z8631 Personal history of diabetic foot ulcer: Secondary | ICD-10-CM | POA: Diagnosis not present

## 2020-07-01 DIAGNOSIS — S98132A Complete traumatic amputation of one left lesser toe, initial encounter: Secondary | ICD-10-CM | POA: Diagnosis not present

## 2020-07-01 DIAGNOSIS — E1151 Type 2 diabetes mellitus with diabetic peripheral angiopathy without gangrene: Secondary | ICD-10-CM | POA: Diagnosis not present

## 2020-07-01 DIAGNOSIS — M21612 Bunion of left foot: Secondary | ICD-10-CM | POA: Diagnosis not present

## 2020-07-01 DIAGNOSIS — L89891 Pressure ulcer of other site, stage 1: Secondary | ICD-10-CM | POA: Diagnosis not present

## 2020-07-01 DIAGNOSIS — M21611 Bunion of right foot: Secondary | ICD-10-CM | POA: Diagnosis not present

## 2020-07-14 ENCOUNTER — Telehealth: Payer: Self-pay | Admitting: Pharmacist

## 2020-07-14 NOTE — Telephone Encounter (Cosign Needed)
error 

## 2020-07-14 NOTE — Progress Notes (Signed)
Patient called to ask why she has not received her [employer]. Told the patient that she receives basaglar from patient assistance [place] and to give the a call to see if she needs to reapply. Gave number to [facility] to patient to call.   Wendy Poet, [place] [phone]

## 2020-07-16 NOTE — Telephone Encounter (Signed)
I created a patient-friendly medication list including where to get refills from various patient assistance programs w/ phone numbers. Will deliver the list with patient's next medication delivery.

## 2020-07-18 ENCOUNTER — Other Ambulatory Visit: Payer: Self-pay | Admitting: Internal Medicine

## 2020-07-18 DIAGNOSIS — I7025 Atherosclerosis of native arteries of other extremities with ulceration: Secondary | ICD-10-CM

## 2020-07-18 DIAGNOSIS — I10 Essential (primary) hypertension: Secondary | ICD-10-CM

## 2020-07-18 DIAGNOSIS — E1151 Type 2 diabetes mellitus with diabetic peripheral angiopathy without gangrene: Secondary | ICD-10-CM

## 2020-07-18 DIAGNOSIS — I739 Peripheral vascular disease, unspecified: Secondary | ICD-10-CM

## 2020-07-18 DIAGNOSIS — IMO0002 Reserved for concepts with insufficient information to code with codable children: Secondary | ICD-10-CM

## 2020-07-19 NOTE — Telephone Encounter (Signed)
Patient requested samples for Basaglar. Set aside 1 pen, stored in fridge under sample cabinet.  Also left patient-friendly med list with patient assistance information in front office cabinet for patient to pick up.

## 2020-07-19 NOTE — Progress Notes (Signed)
Spoke with patient this morning to ask if she was needed her Xarelto refilled by [facility]. The patient stated that she still get xarelto from [facility] because it is a little less expensive than [facility]. She said if anything changes with getting it filled at [facility] she will call and let me know.  Olivia Mackie Ellis,CPA [facility]

## 2020-07-29 ENCOUNTER — Telehealth: Payer: Self-pay | Admitting: Internal Medicine

## 2020-07-29 NOTE — Progress Notes (Unsigned)
Chronic Care Management Pharmacy Assistant   Name: Theresa Harrington  MRN: [id number] DOB: 1955-09-09  :  Reason for Encounter: Medication coordination call   Recent office visits None  Recent consult visits 07/01/2020 Dr. Gean [facility] visit: None in previous 6 months  Medications: Outpatient Encounter Medications as of 07/29/2020  Medication Sig Note  . Blood Glucose Monitoring Suppl (ONETOUCH VERIO IQ SYSTEM) w/Device KIT USE TO CHECK SUGAR DAILY   . cetirizine (ZYRTEC) 10 MG tablet Take 10 mg by mouth daily as needed for allergies.   [name] dorzolamide-timolol (COSOPT) 22.3-6.8 MG/ML ophthalmic solution Place 1 drop into the right eye 2 (two) times daily.    . Empagliflozin-metFORMIN HCl ER (SYNJARDY XR) 12.08-998 MG TB24 Take 1 tablet by mouth daily.   [name] ezetimibe (ZETIA) 10 MG tablet TAKE ONE TABLET BY MOUTH EVERYDAY AT BEDTIME   . Glucagon (BAQSIMI TWO PACK) 3 MG/DOSE POWD Place 1 Act into the nose once as needed for up to 1 dose.   [name] glucose blood test strip Use to check blood sugar twice daily. DX: E11.8   . Insulin Glargine (BASAGLAR KWIKPEN) 100 UNIT/ML Inject 0.3 mLs (30 Units total) into the skin daily. (Patient taking differently: Inject 50 Units into the skin at bedtime.)   . insulin lispro (HUMALOG) 100 UNIT/ML injection Inject 14-20 Units into the skin 3 (three) times daily before meals.    . Insulin Pen Needle (PEN NEEDLES) 32G X 4 MM MISC Inject 1 pen as directed 4 (four) times daily. Use to inject levemir or novolog as directed.   . latanoprost (XALATAN) 0.005 % ophthalmic solution Place 1 drop into both eyes at bedtime.    [name] losartan-hydrochlorothiazide (HYZAAR) 100-12.5 MG tablet TAKE ONE TABLET BY MOUTH EVERYDAY AT BEDTIME   . magnesium oxide (MAG-OX) 400 MG tablet TAKE ONE TABLET BY MOUTH EVERYDAY AT BEDTIME   . mupirocin ointment (BACTROBAN) 2 % Apply 1 application topically daily.    . nebivolol (BYSTOLIC) 5 MG tablet Take 1 tablet (5 mg total) by  mouth daily.   [name] omeprazole (PRILOSEC) 40 MG capsule TAKE ONE CAPSULE BY MOUTH EVERY MORNING and TAKE ONE CAPSULE BY MOUTH EVERY EVENING   . potassium chloride SA (KLOR-CON) 20 MEQ tablet TAKE ONE TABLET BY MOUTH EVERY MORNING   . pravastatin (PRAVACHOL) 40 MG tablet TAKE ONE TABLET BY MOUTH EVERYDAY AT BEDTIME   . rivaroxaban (XARELTO) 20 MG TABS tablet Take 1 tablet (20 mg total) by mouth daily.   . Semaglutide, 1 MG/DOSE, (OZEMPIC, 1 MG/DOSE,) 4 MG/3ML SOPN Inject 0.75 mLs (1 mg total) into the skin once a week. (Patient taking differently: Inject 1 mg into the skin every Friday.)   . thiamine 100 MG tablet Take 1 tablet (100 mg total) by mouth every other day.   . Vilazodone HCl (VIIBRYD STARTER PACK) 10 & 20 MG KIT Take 1 tablet by mouth daily. 03/03/2020: Has not started yet   No facility-administered encounter medications on file as of 07/29/2020.    Reviewed chart for medication changes ahead of medication coordination call.  No OVs, Consults, or hospital visits since last care coordination call/Pharmacist visit. (If appropriate, list visit date, provider name)  No medication changes indicated OR if recent visit, treatment plan here.  BP Readings from Last 3 Encounters:  06/03/20 111/62  04/22/20 (!) 142/84  03/15/20 123/65    Lab Results  Component Value Date   HGBA1C 7.3 (H) 04/22/2020     Patient obtains  medications through Adherence Packaging  90 Days   Last adherence delivery included: Hydrochlorothiazide 12.43m Tab  Take one tab every day at bedtime Losartan 1067mTab Take one tab by mouth every day at bedtime Vitamin B-1 10044mab Take one tab every day at bedtime Pot Cl Micro 78m85make one tab every morning Pravastatin 40mg1me one tab every day at bedtime Ezetimibe 10mg 88mke one tab every day at bedtime Omeprazole 40mg T24mone capsule every morning and take one capsule every evening Mag Oxide 400mg Ta23mne tab every day at  bedtime Losartan-Hydrochlorothiazide 100-12.5 mg Take 1 tab at bedtime  Patient is due for next adherence delivery on: 08/06/20 Called patient and reviewed medications and coordinated delivery.  This delivery to include: Hydrochlorothiazide 12.5mg Tab 73mke one tab every day at bedtime Losartan 100mg Tab 54m one tab by mouth every day at bedtime Vitamin B-1 100mg Tab T93mone tab every day at bedtime Pot Cl Micro 78meq Take 51mtab every morning Pravastatin 40mg Take on82mb every day at bedtime Ezetimibe 10mg   Take o40mab every day at bedtime Omeprazole 40mg Take one 56mule every morning and take one capsule every evening Mag Oxide 400mg Take one t54mvery day at bedtime Losartan-Hydrochlorothiazide 100-12.5 mg Take 1 tab at bedtime   Patient declined the following medications (meds) due to (reason)  Patient needs refills for:  None noted  Confirmed delivery date of 08/06/20, advised patient that pharmacy will contact them the morning of delivery.  Theresa Harrington Georgiana ShorehaLagrostant [phone]

## 2020-08-05 DIAGNOSIS — S90421A Blister (nonthermal), right great toe, initial encounter: Secondary | ICD-10-CM | POA: Diagnosis not present

## 2020-08-05 DIAGNOSIS — E1151 Type 2 diabetes mellitus with diabetic peripheral angiopathy without gangrene: Secondary | ICD-10-CM | POA: Diagnosis not present

## 2020-08-05 NOTE — Progress Notes (Addendum)
A call was made today to [facility] to see why patient was not receiving her Basaglar pen. Spoke to the representative Abigail Butts) who said that the doses on the application on page 5 & 6 does not match. Once the prescription on pg 5 is corrected, fax back to [facility] and it will be forward to the pharmacy and then the patient will be contacted to set up automatic refills.   [place] Pharmacist Assistant [phone]  Time spent:30

## 2020-08-10 ENCOUNTER — Telehealth: Payer: Self-pay | Admitting: Internal Medicine

## 2020-08-10 ENCOUNTER — Telehealth: Payer: Self-pay | Admitting: Pharmacist

## 2020-08-10 NOTE — Telephone Encounter (Signed)
Patient informed that there are no samples of Basaglar available. Patient receives this through [employer] patient assistance - they were contacted and report a delivery of Basglar will be made Friday 4/15. Pt reports she can wait until Friday to receive medication.  Of note, offered to send to Lantus to [facility] for patient to pick up a short supply (Lantus is covered by insurance, Nancee Liter is not). Pt declined and said she will wait for [employer] delivery.

## 2020-08-10 NOTE — Telephone Encounter (Signed)
   Patient requesting sample of  Insulin Glargine (BASAGLAR KWIKPEN) 100 UNIT/ML  She states she has no medication remaining

## 2020-08-10 NOTE — Progress Notes (Addendum)
Theresa Harrington made a call today to ask for samples of Basaglar, Patient states that she finished her last pen today. She would like samples until she receives them from [employer]. If she can't get the samples she would like to get a prescription sent in to [facility]   [place] Pharmacist Assistant [phone]

## 2020-08-12 DIAGNOSIS — S98132A Complete traumatic amputation of one left lesser toe, initial encounter: Secondary | ICD-10-CM | POA: Diagnosis not present

## 2020-08-12 DIAGNOSIS — S90424D Blister (nonthermal), right lesser toe(s), subsequent encounter: Secondary | ICD-10-CM | POA: Diagnosis not present

## 2020-08-12 DIAGNOSIS — I779 Disorder of arteries and arterioles, unspecified: Secondary | ICD-10-CM | POA: Diagnosis not present

## 2020-08-12 DIAGNOSIS — Z8631 Personal history of diabetic foot ulcer: Secondary | ICD-10-CM | POA: Diagnosis not present

## 2020-08-19 DIAGNOSIS — L89892 Pressure ulcer of other site, stage 2: Secondary | ICD-10-CM | POA: Diagnosis not present

## 2020-08-19 DIAGNOSIS — E1151 Type 2 diabetes mellitus with diabetic peripheral angiopathy without gangrene: Secondary | ICD-10-CM | POA: Diagnosis not present

## 2020-08-19 DIAGNOSIS — L03031 Cellulitis of right toe: Secondary | ICD-10-CM | POA: Diagnosis not present

## 2020-08-26 DIAGNOSIS — L03031 Cellulitis of right toe: Secondary | ICD-10-CM | POA: Diagnosis not present

## 2020-08-26 DIAGNOSIS — E11621 Type 2 diabetes mellitus with foot ulcer: Secondary | ICD-10-CM | POA: Diagnosis not present

## 2020-08-26 DIAGNOSIS — E1151 Type 2 diabetes mellitus with diabetic peripheral angiopathy without gangrene: Secondary | ICD-10-CM | POA: Diagnosis not present

## 2020-08-26 DIAGNOSIS — L97512 Non-pressure chronic ulcer of other part of right foot with fat layer exposed: Secondary | ICD-10-CM | POA: Diagnosis not present

## 2020-08-27 ENCOUNTER — Telehealth: Payer: Self-pay

## 2020-08-27 NOTE — Telephone Encounter (Signed)
Spoke with the patient to let her know that her Ozempic has arrived at our office. Patient stated that she would pick the medication up on Monday 08/30/2020. No other questions or concerns at this time.

## 2020-08-31 ENCOUNTER — Other Ambulatory Visit: Payer: Self-pay

## 2020-08-31 DIAGNOSIS — I70213 Atherosclerosis of native arteries of extremities with intermittent claudication, bilateral legs: Secondary | ICD-10-CM

## 2020-08-31 DIAGNOSIS — I6529 Occlusion and stenosis of unspecified carotid artery: Secondary | ICD-10-CM

## 2020-09-02 DIAGNOSIS — E1151 Type 2 diabetes mellitus with diabetic peripheral angiopathy without gangrene: Secondary | ICD-10-CM | POA: Diagnosis not present

## 2020-09-02 DIAGNOSIS — L03031 Cellulitis of right toe: Secondary | ICD-10-CM | POA: Diagnosis not present

## 2020-09-02 DIAGNOSIS — L97512 Non-pressure chronic ulcer of other part of right foot with fat layer exposed: Secondary | ICD-10-CM | POA: Diagnosis not present

## 2020-09-08 ENCOUNTER — Other Ambulatory Visit: Payer: Self-pay | Admitting: *Deleted

## 2020-09-08 DIAGNOSIS — I340 Nonrheumatic mitral (valve) insufficiency: Secondary | ICD-10-CM

## 2020-09-13 ENCOUNTER — Encounter (HOSPITAL_COMMUNITY): Payer: PPO

## 2020-09-13 ENCOUNTER — Ambulatory Visit: Payer: PPO | Admitting: Surgery

## 2020-09-16 ENCOUNTER — Telehealth: Payer: Self-pay | Admitting: Pharmacist

## 2020-09-16 DIAGNOSIS — L03031 Cellulitis of right toe: Secondary | ICD-10-CM | POA: Diagnosis not present

## 2020-09-16 DIAGNOSIS — L97512 Non-pressure chronic ulcer of other part of right foot with fat layer exposed: Secondary | ICD-10-CM | POA: Diagnosis not present

## 2020-09-16 DIAGNOSIS — E1151 Type 2 diabetes mellitus with diabetic peripheral angiopathy without gangrene: Secondary | ICD-10-CM | POA: Diagnosis not present

## 2020-09-16 NOTE — Progress Notes (Signed)
    Chronic Care Management Pharmacy Assistant   Name: Theresa Harrington  MRN: [id number] DOB: 1955/06/20   Medications: Outpatient Encounter Medications as of 09/16/2020  Medication Sig Note  . Blood Glucose Monitoring Suppl (ONETOUCH VERIO IQ SYSTEM) w/Device KIT USE TO CHECK SUGAR DAILY   . cetirizine (ZYRTEC) 10 MG tablet Take 10 mg by mouth daily as needed for allergies.   [name] dorzolamide-timolol (COSOPT) 22.3-6.8 MG/ML ophthalmic solution Place 1 drop into the right eye 2 (two) times daily.    . Empagliflozin-metFORMIN HCl ER (SYNJARDY XR) 12.08-998 MG TB24 Take 1 tablet by mouth daily.   [name] ezetimibe (ZETIA) 10 MG tablet TAKE ONE TABLET BY MOUTH EVERYDAY AT BEDTIME   . Glucagon (BAQSIMI TWO PACK) 3 MG/DOSE POWD Place 1 Act into the nose once as needed for up to 1 dose.   [name] glucose blood test strip Use to check blood sugar twice daily. DX: E11.8   . Insulin Glargine (BASAGLAR KWIKPEN) 100 UNIT/ML Inject 0.3 mLs (30 Units total) into the skin daily. (Patient taking differently: Inject 50 Units into the skin at bedtime.)   . insulin lispro (HUMALOG) 100 UNIT/ML injection Inject 14-20 Units into the skin 3 (three) times daily before meals.    . Insulin Pen Needle (PEN NEEDLES) 32G X 4 MM MISC Inject 1 pen as directed 4 (four) times daily. Use to inject levemir or novolog as directed.   . latanoprost (XALATAN) 0.005 % ophthalmic solution Place 1 drop into both eyes at bedtime.    [name] losartan-hydrochlorothiazide (HYZAAR) 100-12.5 MG tablet TAKE ONE TABLET BY MOUTH EVERYDAY AT BEDTIME   . magnesium oxide (MAG-OX) 400 MG tablet TAKE ONE TABLET BY MOUTH EVERYDAY AT BEDTIME   . mupirocin ointment (BACTROBAN) 2 % Apply 1 application topically daily.    . nebivolol (BYSTOLIC) 5 MG tablet Take 1 tablet (5 mg total) by mouth daily.   [name] omeprazole (PRILOSEC) 40 MG capsule TAKE ONE CAPSULE BY MOUTH EVERY MORNING and TAKE ONE CAPSULE BY MOUTH EVERY EVENING   . potassium chloride SA (KLOR-CON) 20 MEQ tablet TAKE  ONE TABLET BY MOUTH EVERY MORNING   . pravastatin (PRAVACHOL) 40 MG tablet TAKE ONE TABLET BY MOUTH EVERYDAY AT BEDTIME   . rivaroxaban (XARELTO) 20 MG TABS tablet Take 1 tablet (20 mg total) by mouth daily.   . Semaglutide, 1 MG/DOSE, (OZEMPIC, 1 MG/DOSE,) 4 MG/3ML SOPN Inject 0.75 mLs (1 mg total) into the skin once a week. (Patient taking differently: Inject 1 mg into the skin every Friday.)   . thiamine 100 MG tablet Take 1 tablet (100 mg total) by mouth every other day.   . Vilazodone HCl (VIIBRYD STARTER PACK) 10 & 20 MG KIT Take 1 tablet by mouth daily. 03/03/2020: Has not started yet   No facility-administered encounter medications on file as of 09/16/2020.   Ms. West called this morning want to know if she could get approval letters from the medications she gets from the manufacturer so that she can send then to her insurance company. Told the patient I would ask Mendel Ryder and get back with her.   [place] Pharmacist Assistant [phone]  Time spent:15

## 2020-09-20 NOTE — Progress Notes (Addendum)
A call was made to [facility] to ask for patients approval letter to be faxed to the clinical pharmacist at [phone]. Spoke with the representative Jenny Reichmann who faxed the approval letter from 05/28/20 to [place].  [place] Pharmacist Assistant [phone]  Time spent:31

## 2020-09-21 ENCOUNTER — Telehealth: Payer: Self-pay | Admitting: Pharmacist

## 2020-09-21 NOTE — Telephone Encounter (Signed)
Gathered patient's approval letters (below) and mailed copies to patient's home address.  [employer] Assist Scientist, water quality) [employer] Cares (Ozempic) [employer] Cares (Basaglar, Humalog)  No fax received from [employer] yet.

## 2020-09-21 NOTE — Progress Notes (Signed)
Chronic Care Management Pharmacy Assistant   Name: Theresa Harrington  MRN: [id number] DOB: 06-23-1955  Reason for Encounter: Medication Review    Medications: Outpatient Encounter Medications as of 09/21/2020  Medication Sig Note  . Blood Glucose Monitoring Suppl (ONETOUCH VERIO IQ SYSTEM) w/Device KIT USE TO CHECK SUGAR DAILY   . cetirizine (ZYRTEC) 10 MG tablet Take 10 mg by mouth daily as needed for allergies.   [name] dorzolamide-timolol (COSOPT) 22.3-6.8 MG/ML ophthalmic solution Place 1 drop into the right eye 2 (two) times daily.    . Empagliflozin-metFORMIN HCl ER (SYNJARDY XR) 12.08-998 MG TB24 Take 1 tablet by mouth daily.   [name] ezetimibe (ZETIA) 10 MG tablet TAKE ONE TABLET BY MOUTH EVERYDAY AT BEDTIME   . Glucagon (BAQSIMI TWO PACK) 3 MG/DOSE POWD Place 1 Act into the nose once as needed for up to 1 dose.   [name] glucose blood test strip Use to check blood sugar twice daily. DX: E11.8   . Insulin Glargine (BASAGLAR KWIKPEN) 100 UNIT/ML Inject 0.3 mLs (30 Units total) into the skin daily. (Patient taking differently: Inject 50 Units into the skin at bedtime.)   . insulin lispro (HUMALOG) 100 UNIT/ML injection Inject 14-20 Units into the skin 3 (three) times daily before meals.    . Insulin Pen Needle (PEN NEEDLES) 32G X 4 MM MISC Inject 1 pen as directed 4 (four) times daily. Use to inject levemir or novolog as directed.   . latanoprost (XALATAN) 0.005 % ophthalmic solution Place 1 drop into both eyes at bedtime.    [name] losartan-hydrochlorothiazide (HYZAAR) 100-12.5 MG tablet TAKE ONE TABLET BY MOUTH EVERYDAY AT BEDTIME   . magnesium oxide (MAG-OX) 400 MG tablet TAKE ONE TABLET BY MOUTH EVERYDAY AT BEDTIME   . mupirocin ointment (BACTROBAN) 2 % Apply 1 application topically daily.    . nebivolol (BYSTOLIC) 5 MG tablet Take 1 tablet (5 mg total) by mouth daily.   [name] omeprazole (PRILOSEC) 40 MG capsule TAKE ONE CAPSULE BY MOUTH EVERY MORNING and TAKE ONE CAPSULE BY MOUTH EVERY EVENING   .  potassium chloride SA (KLOR-CON) 20 MEQ tablet TAKE ONE TABLET BY MOUTH EVERY MORNING   . pravastatin (PRAVACHOL) 40 MG tablet TAKE ONE TABLET BY MOUTH EVERYDAY AT BEDTIME   . rivaroxaban (XARELTO) 20 MG TABS tablet Take 1 tablet (20 mg total) by mouth daily.   . Semaglutide, 1 MG/DOSE, (OZEMPIC, 1 MG/DOSE,) 4 MG/3ML SOPN Inject 0.75 mLs (1 mg total) into the skin once a week. (Patient taking differently: Inject 1 mg into the skin every Friday.)   . thiamine 100 MG tablet Take 1 tablet (100 mg total) by mouth every other day.   . Vilazodone HCl (VIIBRYD STARTER PACK) 10 & 20 MG KIT Take 1 tablet by mouth daily. 03/03/2020: Has not started yet   No facility-administered encounter medications on file as of 09/21/2020.    Reviewed chart for medication changes ahead of medication coordination call.  No OVs, Consults, or hospital visits since last care coordination call/Pharmacist visit. (If appropriate, list visit date, provider name)  No medication changes indicated OR if recent visit, treatment plan here.  BP Readings from Last 3 Encounters:  06/03/20 111/62  04/22/20 (!) 142/84  03/15/20 123/65    Lab Results  Component Value Date   HGBA1C 7.3 (H) 04/22/2020     Reviewed chart for medication changes ahead of medication coordination call.  No OVs, Consults, or hospital visits since last care coordination call / Pharmacist visit. No  medication changes indicated  BP Readings from Last 3 Encounters:  06/03/20 111/62  04/22/20 (!) 142/84  03/15/20 123/65    Lab Results  Component Value Date   HGBA1C 7.3 (H) 04/22/2020     Patient obtains medications through Adherence Packaging  90 Days   Last adherence delivery included (date: Feb. 2022): Hydrochlorothiazide 12.66m Tab  Take one tab every day at bedtime Losartan 1031mTab Take one tab by mouth every day at bedtime Vitamin B-1 10026mab Take one tab every day at bedtime Potassium Cl Micro 36m77make one tab every  morning Pravastatin 40mg36me one tab every day at bedtime Ezetimibe 10mg 4mke one tab every day at bedtime Omeprazole 40mg T69mone capsule every morning and take one capsule every evening Mag Oxide 400mg Ta61mne tab every day at bedtime  Patient is due for next adherence delivery on: 09/29/20. Called patient and reviewed medication needs.  Patient declined need for refills at this time: Additional supply on hand. Patient stated she wanted to wait until she sees Dr. Jones onRonnald Ramp22 to order her next fill. Patient is aware of how to contact pharmacist if refills are needed prior to next adherence delivery.  Star Rating Drugs: Losartan-hydrochlorothiazide - last fill 04/29/20 90D Pravastatin - last fill 04/29/20 90D Ozempic - last fill 12/17/19  Leslie IOrinda [place] Pharmacists Assistant [phone]: [phone]

## 2020-09-30 DIAGNOSIS — E1151 Type 2 diabetes mellitus with diabetic peripheral angiopathy without gangrene: Secondary | ICD-10-CM | POA: Diagnosis not present

## 2020-09-30 DIAGNOSIS — S98132A Complete traumatic amputation of one left lesser toe, initial encounter: Secondary | ICD-10-CM | POA: Diagnosis not present

## 2020-09-30 DIAGNOSIS — L97511 Non-pressure chronic ulcer of other part of right foot limited to breakdown of skin: Secondary | ICD-10-CM | POA: Diagnosis not present

## 2020-10-05 ENCOUNTER — Ambulatory Visit (INDEPENDENT_AMBULATORY_CARE_PROVIDER_SITE_OTHER): Payer: PPO | Admitting: Pharmacist

## 2020-10-05 ENCOUNTER — Other Ambulatory Visit: Payer: Self-pay

## 2020-10-05 DIAGNOSIS — I7025 Atherosclerosis of native arteries of other extremities with ulceration: Secondary | ICD-10-CM

## 2020-10-05 DIAGNOSIS — E785 Hyperlipidemia, unspecified: Secondary | ICD-10-CM | POA: Diagnosis not present

## 2020-10-05 DIAGNOSIS — E1159 Type 2 diabetes mellitus with other circulatory complications: Secondary | ICD-10-CM | POA: Diagnosis not present

## 2020-10-05 DIAGNOSIS — E1151 Type 2 diabetes mellitus with diabetic peripheral angiopathy without gangrene: Secondary | ICD-10-CM

## 2020-10-05 DIAGNOSIS — I4891 Unspecified atrial fibrillation: Secondary | ICD-10-CM

## 2020-10-05 DIAGNOSIS — I10 Essential (primary) hypertension: Secondary | ICD-10-CM | POA: Diagnosis not present

## 2020-10-05 DIAGNOSIS — Z794 Long term (current) use of insulin: Secondary | ICD-10-CM

## 2020-10-05 DIAGNOSIS — IMO0002 Reserved for concepts with insufficient information to code with codable children: Secondary | ICD-10-CM

## 2020-10-05 DIAGNOSIS — E1165 Type 2 diabetes mellitus with hyperglycemia: Secondary | ICD-10-CM

## 2020-10-05 MED ORDER — NEBIVOLOL HCL 5 MG PO TABS: 5.0000 mg | ORAL_TABLET | Freq: Every day | ORAL | 1 refills | Status: DC

## 2020-10-05 MED ORDER — SYNJARDY XR 12.5-1000 MG PO TB24: 1.0000 | ORAL_TABLET | Freq: Every day | ORAL | 1 refills | Status: DC

## 2020-10-05 MED ORDER — OZEMPIC (1 MG/DOSE) 4 MG/3ML ~~LOC~~ SOPN: 1.0000 mg | PEN_INJECTOR | SUBCUTANEOUS | 1 refills | Status: DC

## 2020-10-05 MED ORDER — BASAGLAR KWIKPEN 100 UNIT/ML ~~LOC~~ SOPN: 50.0000 [IU] | PEN_INJECTOR | Freq: Every day | SUBCUTANEOUS | 2 refills | Status: DC
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
## 2020-10-05 NOTE — Patient Instructions (Signed)
Visit Information  Phone number for Pharmacist: [phone]  Goals Addressed            This Visit's Progress   . Manage My Medicine       Timeframe:  Long-Range Goal Priority:  Medium Start Date:      10/05/20                       Expected End Date:    10/05/21                   Follow Up Date Dec 2022   - call for medicine refill 2 or 3 days before it runs out - call if I am sick and can't take my medicine - keep a list of all the medicines I take; vitamins and herbals too -Utilize [facility] pharmacy for medication synchronization, packaging and delivery    Why is this important?   . These steps will help you keep on track with your medicines.   Notes:        The patient verbalized understanding of instructions, educational materials, and care plan provided today and declined offer to receive copy of patient instructions, educational materials, and care plan.  Telephone follow up appointment with pharmacy team member scheduled for: 6 months  Charlene Brooke, PharmD, [place], CPP Clinical Pharmacist [facility] at [facility] [phone]

## 2020-10-06 ENCOUNTER — Ambulatory Visit: Payer: PPO | Admitting: Internal Medicine

## 2020-10-07 ENCOUNTER — Other Ambulatory Visit: Payer: Self-pay

## 2020-10-07 ENCOUNTER — Ambulatory Visit (HOSPITAL_COMMUNITY): Payer: PPO | Attending: Cardiology

## 2020-10-07 DIAGNOSIS — I340 Nonrheumatic mitral (valve) insufficiency: Secondary | ICD-10-CM | POA: Diagnosis not present

## 2020-10-07 LAB — ECHOCARDIOGRAM COMPLETE
Area-P 1/2: 1.98 cm2
MV VTI: 1.4 cm2
S' Lateral: 2.3 cm

## 2020-10-20 ENCOUNTER — Telehealth: Payer: Self-pay | Admitting: Pharmacist

## 2020-10-20 NOTE — Progress Notes (Signed)
    Chronic Care Management Pharmacy Assistant   Name: Theresa Harrington  MRN: [id number] DOB: 04-Feb-1956   Reason for Encounter: Medication Review   Recent office visits:  None ID  Recent consult visits:  None ID  Hospital visits:  None in previous 6 months  Medications: Outpatient Encounter Medications as of 10/20/2020  Medication Sig Note   Blood Glucose Monitoring Suppl (ONETOUCH VERIO IQ SYSTEM) w/Device KIT USE TO CHECK SUGAR DAILY    cetirizine (ZYRTEC) 10 MG tablet Take 10 mg by mouth daily as needed for allergies.    dorzolamide-timolol (COSOPT) 22.3-6.8 MG/ML ophthalmic solution Place 1 drop into the right eye 2 (two) times daily.     Empagliflozin-metFORMIN HCl ER (SYNJARDY XR) 12.08-998 MG TB24 Take 1 tablet by mouth daily. Via [employer] patient assistance    ezetimibe (ZETIA) 10 MG tablet TAKE ONE TABLET BY MOUTH EVERYDAY AT BEDTIME    Glucagon (BAQSIMI TWO PACK) 3 MG/DOSE POWD Place 1 Act into the nose once as needed for up to 1 dose.    glucose blood test strip Use to check blood sugar twice daily. DX: E11.8    Insulin Glargine (BASAGLAR KWIKPEN) 100 UNIT/ML Inject 50 Units into the skin at bedtime. [place] patient assistance    insulin lispro (HUMALOG) 100 UNIT/ML injection Inject 14-20 Units into the skin 3 (three) times daily before meals. [place] patient assistance    Insulin Pen Needle (PEN NEEDLES) 32G X 4 MM MISC Inject 1 pen as directed 4 (four) times daily. Use to inject levemir or novolog as directed.    latanoprost (XALATAN) 0.005 % ophthalmic solution Place 1 drop into both eyes at bedtime.     losartan-hydrochlorothiazide (HYZAAR) 100-12.5 MG tablet TAKE ONE TABLET BY MOUTH EVERYDAY AT BEDTIME    magnesium oxide (MAG-OX) 400 MG tablet TAKE ONE TABLET BY MOUTH EVERYDAY AT BEDTIME    mupirocin ointment (BACTROBAN) 2 % Apply 1 application topically daily.     nebivolol (BYSTOLIC) 5 MG tablet Take 1 tablet (5 mg total) by mouth daily. Via [employer]  Assist patient assistance    omeprazole (PRILOSEC) 40 MG capsule TAKE ONE CAPSULE BY MOUTH EVERY MORNING and TAKE ONE CAPSULE BY MOUTH EVERY EVENING    potassium chloride SA (KLOR-CON) 20 MEQ tablet TAKE ONE TABLET BY MOUTH EVERY MORNING    pravastatin (PRAVACHOL) 40 MG tablet TAKE ONE TABLET BY MOUTH EVERYDAY AT BEDTIME    rivaroxaban (XARELTO) 20 MG TABS tablet Take 1 tablet (20 mg total) by mouth daily.    Semaglutide, 1 MG/DOSE, (OZEMPIC, 1 MG/DOSE,) 4 MG/3ML SOPN Inject 1 mg into the skin once a week. Via [employer] patient assistance    thiamine 100 MG tablet Take 1 tablet (100 mg total) by mouth every other day.    Vilazodone HCl (VIIBRYD STARTER PACK) 10 & 20 MG KIT Take 1 tablet by mouth daily. (Patient not taking: Reported on 10/05/2020) 03/03/2020: Has not started yet   No facility-administered encounter medications on file as of 10/20/2020.    Pharmacist Review  Called patient for medication review this morning. Patient stated that she does not need or want any medications until she has her office visit with Dr. [name]. Patient stated that she is not home at this time but believes that she has enough medications on hand right now. Patient stated that she will call back when she is ready for medications  [place] Pharmacist Assistant [phone]   Time spent:17

## 2020-10-25 ENCOUNTER — Encounter: Payer: Self-pay | Admitting: Surgery

## 2020-10-25 ENCOUNTER — Ambulatory Visit (HOSPITAL_COMMUNITY)
Admission: RE | Admit: 2020-10-25 | Discharge: 2020-10-25 | Disposition: A | Discharge status: Home / Self Care | Payer: PPO | Source: Ambulatory Visit | Attending: Surgery | Admitting: Surgery

## 2020-10-25 ENCOUNTER — Other Ambulatory Visit: Payer: Self-pay

## 2020-10-25 ENCOUNTER — Ambulatory Visit: Payer: PPO | Admitting: Surgery

## 2020-10-25 ENCOUNTER — Ambulatory Visit (INDEPENDENT_AMBULATORY_CARE_PROVIDER_SITE_OTHER)
Admission: RE | Admit: 2020-10-25 | Discharge: 2020-10-25 | Disposition: A | Discharge status: Home / Self Care | Payer: PPO | Source: Ambulatory Visit | Attending: Surgery | Admitting: Surgery

## 2020-10-25 VITALS — BP 184/81 | HR 56 | Temp 98.0°F | Resp 16 | Ht 64.0 in | Wt 168.3 lb

## 2020-10-25 DIAGNOSIS — I7025 Atherosclerosis of native arteries of other extremities with ulceration: Secondary | ICD-10-CM | POA: Diagnosis not present

## 2020-10-25 DIAGNOSIS — I6529 Occlusion and stenosis of unspecified carotid artery: Secondary | ICD-10-CM

## 2020-10-25 DIAGNOSIS — I70213 Atherosclerosis of native arteries of extremities with intermittent claudication, bilateral legs: Secondary | ICD-10-CM | POA: Diagnosis not present

## 2020-10-25 NOTE — Progress Notes (Signed)
Vascular and Vein Specialist of [facility]  Patient name: Theresa Harrington MRN: [id number] DOB: [birth year] Sex: female   REASON FOR VISIT:    Follow up  HISOTRY OF PRESENT ILLNESS:   Theresa Harrington is a 65 y.o. female who is back for follow-up.  She initially underwent a left femoral-popliteal bypass graft by Dr. Donnetta Hutching on 06/08/2014.  Unfortunately, this occluded early.  I took her back to the operating room on 06/09/2014 and we did her bypass graft with Gore-Tex.  I also performed a endarterectomy of the left anterior tibial artery.  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  Her bypass graft is known to be occluded.    She developed a sore on the bottom of her right great toe which look like a popped blister.  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  She felt that her sore was getting better and so we elected to monitor this with plans for bypass to the deteriorate.  Fortunately, her ulcer has healed.  She is trying to increase her activity level   She continues to struggle with control of her diabetes.   She remains on statin therapy.  She is anticoagulated for atrial fibrillation   PAST MEDICAL HISTORY:   Past Medical History:  Diagnosis Date   Allergy    Anxiety    Pt. denies   Arthritis    right index figer   Asymptomatic cholelithiasis    Atherosclerosis of aorta (HCC)    Cataract    Clotting disorder ([place])    Gallstones 06/2013   GERD (gastroesophageal reflux disease)    History of kidney stones    lithotrispy   Hyperlipidemia    Hypertension    Hypothyroidism    no meds   Iron deficiency anemia    PAD (peripheral artery disease) (HCC)    PAF (paroxysmal atrial fibrillation) (HCC)    Peripheral arterial occlusive disease (HCC)    lower extremities   Pneumonia    PONV (postoperative nausea and vomiting)    Right ureteral stone     Type 2 diabetes mellitus ([place])    Type   Vitamin B 12 deficiency    Wears glasses      FAMILY HISTORY:   Family History  Problem Relation Age of Onset   Diabetes Father    Heart disease Father    Deep vein thrombosis Father    Hyperlipidemia Father    Diabetes Sister    Heart disease Sister    Deep vein thrombosis Sister    Hyperlipidemia Sister    Diabetes Brother    Heart disease Brother    Cancer Brother    Hyperlipidemia Brother    Colon cancer Maternal Aunt    Diabetes Brother    Diabetes Brother    Kidney disease Mother    Hyperlipidemia Mother    Other Mother        AAA   and    Amputation   Arthritis Other    Cancer Other        colon   Hypertension Other    Stroke Other    Esophageal cancer Neg Hx    Stomach cancer Neg Hx    Inflammatory bowel disease Neg Hx    Liver disease Neg Hx    Pancreatic cancer Neg Hx     SOCIAL HISTORY:   Social History   Tobacco Use  Smoking status: Former    Packs/day: 1.00    Years: 28.00    Pack years: 28.00    Types: Cigarettes    Quit date: 09/05/2001    Years since quitting: 19.1   Smokeless tobacco: Never  Substance Use Topics   Alcohol use: No    Alcohol/week: 0.0 standard drinks     ALLERGIES:   Allergies  Allergen Reactions   Amlodipine Swelling   Atorvastatin Other (See Comments)    Muscle aches    Ampicillin Nausea And Vomiting and Other (See Comments)   Codeine Nausea And Vomiting    Ask patient   Lisinopril Cough        Penicillins Nausea And Vomiting    Did it involve swelling of the face/tongue/throat, SOB, or low BP? No Did it involve sudden or severe rash/hives, skin peeling, or any reaction on the inside of your mouth or nose? No Did you need to seek medical attention at a hospital or doctor's office? No When did it last happen?      20+ years If all above answers are "NO", may proceed with cephalosporin use.    Januvia [Sitagliptin] Nausea And Vomiting     CURRENT MEDICATIONS:    Current Outpatient Medications  Medication Sig Dispense Refill   Blood Glucose Monitoring Suppl (ONETOUCH VERIO IQ SYSTEM) w/Device KIT USE TO CHECK SUGAR DAILY 1 kit 0   cetirizine (ZYRTEC) 10 MG tablet Take 10 mg by mouth daily as needed for allergies.     dorzolamide-timolol (COSOPT) 22.3-6.8 MG/ML ophthalmic solution Place 1 drop into the right eye 2 (two) times daily.      Empagliflozin-metFORMIN HCl ER (SYNJARDY XR) 12.08-998 MG TB24 Take 1 tablet by mouth daily. Via [employer] patient assistance 90 tablet 1   ezetimibe (ZETIA) 10 MG tablet TAKE ONE TABLET BY MOUTH EVERYDAY AT BEDTIME 90 tablet 1   glucose blood test strip Use to check blood sugar twice daily. DX: E11.8 200 each 3   Insulin Glargine (BASAGLAR KWIKPEN) 100 UNIT/ML Inject 50 Units into the skin at bedtime. Via [employer] patient assistance 30 mL 2   insulin lispro (HUMALOG) 100 UNIT/ML injection Inject 14-20 Units into the skin 3 (three) times daily before meals. [place] patient assistance     Insulin Pen Needle (PEN NEEDLES) 32G X 4 MM MISC Inject 1 pen as directed 4 (four) times daily. Use to inject levemir or novolog as directed. 400 each 3   latanoprost (XALATAN) 0.005 % ophthalmic solution Place 1 drop into both eyes at bedtime.      losartan-hydrochlorothiazide (HYZAAR) 100-12.5 MG tablet TAKE ONE TABLET BY MOUTH EVERYDAY AT BEDTIME 90 tablet 1   magnesium oxide (MAG-OX) 400 MG tablet TAKE ONE TABLET BY MOUTH EVERYDAY AT BEDTIME 90 tablet 1   mupirocin ointment (BACTROBAN) 2 % Apply 1 application topically daily.      nebivolol (BYSTOLIC) 5 MG tablet Take 1 tablet (5 mg total) by mouth daily. Via [employer] Assist patient assistance 90 tablet 1   omeprazole (PRILOSEC) 40 MG capsule TAKE ONE CAPSULE BY MOUTH EVERY MORNING and TAKE ONE CAPSULE BY MOUTH EVERY EVENING 180 capsule 1   potassium chloride SA (KLOR-CON) 20 MEQ tablet TAKE ONE TABLET BY MOUTH EVERY MORNING 90 tablet 1   pravastatin (PRAVACHOL) 40 MG  tablet TAKE ONE TABLET BY MOUTH EVERYDAY AT BEDTIME 90 tablet 1   rivaroxaban (XARELTO) 20 MG TABS tablet Take 1 tablet (20 mg total) by mouth daily. 90 tablet  1   Semaglutide, 1 MG/DOSE, (OZEMPIC, 1 MG/DOSE,) 4 MG/3ML SOPN Inject 1 mg into the skin once a week. Via [employer] patient assistance 9 mL 1   thiamine 100 MG tablet Take 1 tablet (100 mg total) by mouth every other day. 45 tablet 1   Vilazodone HCl (VIIBRYD STARTER PACK) 10 & 20 MG KIT Take 1 tablet by mouth daily. 1 kit 0   No current facility-administered medications for this visit.    REVIEW OF SYSTEMS:   [X] denotes positive finding, [ ] denotes negative finding Cardiac  Comments:  Chest pain or chest pressure:    Shortness of breath upon exertion:    Short of breath when lying flat:    Irregular heart rhythm:        Vascular    Pain in calf, thigh, or hip brought on by ambulation:    Pain in feet at night that wakes you up from your sleep:     Blood clot in your veins:    Leg swelling:         Pulmonary    Oxygen at home:    Productive cough:     Wheezing:         Neurologic    Sudden weakness in arms or legs:     Sudden numbness in arms or legs:     Sudden onset of difficulty speaking or slurred speech:    Temporary loss of vision in one eye:     Problems with dizziness:         Gastrointestinal    Blood in stool:     Vomited blood:         Genitourinary    Burning when urinating:     Blood in urine:        Psychiatric    Major depression:         Hematologic    Bleeding problems:    Problems with blood clotting too easily:        Skin    Rashes or ulcers:        Constitutional    Fever or chills:      PHYSICAL EXAM:   Vitals:   10/25/20 1401 10/25/20 1403  BP: (!) 185/80 (!) 184/81  Pulse: (!) 56   Resp: 16   Temp: 98 F (36.7 C)   SpO2: 99%   Weight: 168 lb 4.8 oz (76.3 kg)   Height: 5' 4" (1.626 m)     GENERAL: The patient is a well-nourished female, in no acute distress. The  vital signs are documented above. CARDIAC: There is a regular rate and rhythm.  VASCULAR: Nonpalpable pedal pulses PULMONARY: Non-labored respirations MUSCULOSKELETAL: There are no major deformities or cyanosis. NEUROLOGIC: No focal weakness or paresthesias are detected. SKIN: There are no ulcers or rashes noted. PSYCHIATRIC: The patient has a normal affect.  STUDIES:   I have reviewed the following studies: ABI/TBIToday's ABIToday's TBIPrevious ABIPrevious TBI  +-------+-----------+-----------+------------+------------+  Right  0.63       0.20       0.75        0.30          +-------+-----------+-----------+------------+------------+  Left   0.73       0.36       0.76        0.46          +-------+-----------+-----------+------------+------------+   Right Carotid: Velocities in the right ICA are consistent with a 1-39%  stenosis.  Left Carotid: Velocities in the left ICA are consistent with a 1-39%  stenosis.   Vertebrals:  Bilateral vertebral arteries demonstrate antegrade flow.  Subclavians: Normal flow hemodynamics were seen in bilateral subclavian               arteries.   MEDICAL ISSUES:   PAD: The patient no longer has any active ulcers on her legs.  She is tolerating her level of disability with her claudication.  We discussed trying water aerobics, a recumbent bike or any other form of activity.  She is also interested in working on her diet, all of which I have encouraged.  She will follow-up in 1 year with ABIs for contact me sooner if she develops a wound.  Carotid: Essentially normal ultrasound today    Leia Alf, MD, FACS [facility]ascular and Vein Specialists of [facility] [phone] Pager [phone]

## 2020-10-28 ENCOUNTER — Ambulatory Visit: Payer: PPO | Admitting: Internal Medicine

## 2020-11-04 DIAGNOSIS — E1151 Type 2 diabetes mellitus with diabetic peripheral angiopathy without gangrene: Secondary | ICD-10-CM | POA: Diagnosis not present

## 2020-11-04 DIAGNOSIS — H401113 Primary open-angle glaucoma, right eye, severe stage: Secondary | ICD-10-CM | POA: Diagnosis not present

## 2020-11-04 DIAGNOSIS — E119 Type 2 diabetes mellitus without complications: Secondary | ICD-10-CM | POA: Diagnosis not present

## 2020-11-04 DIAGNOSIS — Z961 Presence of intraocular lens: Secondary | ICD-10-CM | POA: Diagnosis not present

## 2020-11-04 DIAGNOSIS — H26491 Other secondary cataract, right eye: Secondary | ICD-10-CM | POA: Diagnosis not present

## 2020-11-04 DIAGNOSIS — H53032 Strabismic amblyopia, left eye: Secondary | ICD-10-CM | POA: Diagnosis not present

## 2020-11-04 DIAGNOSIS — D3131 Benign neoplasm of right choroid: Secondary | ICD-10-CM | POA: Diagnosis not present

## 2020-11-04 DIAGNOSIS — H401121 Primary open-angle glaucoma, left eye, mild stage: Secondary | ICD-10-CM | POA: Diagnosis not present

## 2020-11-04 DIAGNOSIS — S90424A Blister (nonthermal), right lesser toe(s), initial encounter: Secondary | ICD-10-CM | POA: Diagnosis not present

## 2020-11-08 ENCOUNTER — Telehealth: Payer: Self-pay | Admitting: Pharmacist

## 2020-11-08 NOTE — Progress Notes (Signed)
Chronic Care Management Pharmacy Assistant   Name: Theresa Harrington  MRN: [id number] DOB: 1956-03-20   Reason for Encounter: Medication Review    Recent office visits:  None ID  Recent consult visits:  10/25/20 Dr. Harold Barban Vascular Surgery   Hospital visits:  None in previous 6 months  Medications: Outpatient Encounter Medications as of 11/08/2020  Medication Sig Note   Blood Glucose Monitoring Suppl (ONETOUCH VERIO IQ SYSTEM) w/Device KIT USE TO CHECK SUGAR DAILY    cetirizine (ZYRTEC) 10 MG tablet Take 10 mg by mouth daily as needed for allergies.    dorzolamide-timolol (COSOPT) 22.3-6.8 MG/ML ophthalmic solution Place 1 drop into the right eye 2 (two) times daily.     Empagliflozin-metFORMIN HCl ER (SYNJARDY XR) 12.08-998 MG TB24 Take 1 tablet by mouth daily. Via [employer] patient assistance    ezetimibe (ZETIA) 10 MG tablet TAKE ONE TABLET BY MOUTH EVERYDAY AT BEDTIME    glucose blood test strip Use to check blood sugar twice daily. DX: E11.8    Insulin Glargine (BASAGLAR KWIKPEN) 100 UNIT/ML Inject 50 Units into the skin at bedtime. [place] patient assistance    insulin lispro (HUMALOG) 100 UNIT/ML injection Inject 14-20 Units into the skin 3 (three) times daily before meals. [place] patient assistance    Insulin Pen Needle (PEN NEEDLES) 32G X 4 MM MISC Inject 1 pen as directed 4 (four) times daily. Use to inject levemir or novolog as directed.    latanoprost (XALATAN) 0.005 % ophthalmic solution Place 1 drop into both eyes at bedtime.     losartan-hydrochlorothiazide (HYZAAR) 100-12.5 MG tablet TAKE ONE TABLET BY MOUTH EVERYDAY AT BEDTIME    magnesium oxide (MAG-OX) 400 MG tablet TAKE ONE TABLET BY MOUTH EVERYDAY AT BEDTIME    mupirocin ointment (BACTROBAN) 2 % Apply 1 application topically daily.     nebivolol (BYSTOLIC) 5 MG tablet Take 1 tablet (5 mg total) by mouth daily. Via [employer] Assist patient assistance    omeprazole (PRILOSEC) 40 MG capsule TAKE  ONE CAPSULE BY MOUTH EVERY MORNING and TAKE ONE CAPSULE BY MOUTH EVERY EVENING    potassium chloride SA (KLOR-CON) 20 MEQ tablet TAKE ONE TABLET BY MOUTH EVERY MORNING    pravastatin (PRAVACHOL) 40 MG tablet TAKE ONE TABLET BY MOUTH EVERYDAY AT BEDTIME    rivaroxaban (XARELTO) 20 MG TABS tablet Take 1 tablet (20 mg total) by mouth daily.    Semaglutide, 1 MG/DOSE, (OZEMPIC, 1 MG/DOSE,) 4 MG/3ML SOPN Inject 1 mg into the skin once a week. Via [employer] patient assistance    thiamine 100 MG tablet Take 1 tablet (100 mg total) by mouth every other day.    Vilazodone HCl (VIIBRYD STARTER PACK) 10 & 20 MG KIT Take 1 tablet by mouth daily. 03/03/2020: Has not started yet   No facility-administered encounter medications on file as of 11/08/2020.  Pharmacist Review   Reviewed chart for medication changes ahead of medication coordination call.  No OVs, Consults, or hospital visits since last care coordination call/Pharmacist visit. (If appropriate, list visit date, provider name)  No medication changes indicated OR if recent visit, treatment plan here.  BP Readings from Last 3 Encounters:  10/25/20 (!) 184/81  06/03/20 111/62  04/22/20 (!) 142/84    Lab Results  Component Value Date   HGBA1C 7.3 (H) 04/22/2020     Patient obtains medications through Vials  90 Days   Last adherence delivery included:  Hydrochlorothiazide 12.32m Tab  Take one  tab every day at bedtime Losartan 162m Tab Take one tab by mouth every day at bedtime Vitamin B-1 1049mTab Take one tab every day at bedtime Potassium Cl Micro 2019mTake one tab every morning Pravastatin 54m51mke one tab every day at bedtime Ezetimibe 10mg108make one tab every day at bedtime Omeprazole 54mg 45m one capsule every morning and take one capsule every evening Mag Oxide 400mg T75mone tab every day at bedtime   Patient is due for next adherence delivery on: 11/11/20. Called patient and reviewed medications and coordinated  delivery.  This delivery to include: Hydrochlorothiazide 12.5mg Tab42make one tab every day at bedtime Losartan 100mg Tab33me one tab by mouth every day at bedtime Pravastatin 54mg Take31m tab every day at bedtime Ezetimibe 10mg   Tak32me tab every day at bedtime Omeprazole 54mg Take o85mapsule every morning and take one capsule every evening Mag Oxide 400mg Take on47mb every day at bedtime   Patient needs refills for None noted.  Confirmed delivery date of 11/11/20, advised patient that pharmacy will contact them the morning of delivery.     Tracy Ellis,CBohners Lakessistant [phone]

## 2020-11-11 DIAGNOSIS — E1151 Type 2 diabetes mellitus with diabetic peripheral angiopathy without gangrene: Secondary | ICD-10-CM | POA: Diagnosis not present

## 2020-11-11 DIAGNOSIS — S90424D Blister (nonthermal), right lesser toe(s), subsequent encounter: Secondary | ICD-10-CM | POA: Diagnosis not present

## 2020-11-16 ENCOUNTER — Telehealth: Payer: Self-pay | Admitting: Internal Medicine

## 2020-11-16 NOTE — Telephone Encounter (Signed)
Patient is dropping off handicap placard request and patient is requesting form to be mailed to:  [place], [place] [address]  Please call patient and let her know when it has been mailed  Form placed in providers box

## 2020-11-16 NOTE — Telephone Encounter (Signed)
Form have been completed and given to PCP to sign.

## 2020-11-17 NOTE — Telephone Encounter (Signed)
Form has been signed and mailed per pt request.

## 2020-11-25 DIAGNOSIS — L89891 Pressure ulcer of other site, stage 1: Secondary | ICD-10-CM | POA: Diagnosis not present

## 2020-11-25 DIAGNOSIS — E1151 Type 2 diabetes mellitus with diabetic peripheral angiopathy without gangrene: Secondary | ICD-10-CM | POA: Diagnosis not present

## 2020-11-30 LAB — HM DIABETES EYE EXAM

## 2020-12-01 ENCOUNTER — Ambulatory Visit: Payer: PPO | Admitting: Internal Medicine

## 2020-12-02 ENCOUNTER — Ambulatory Visit: Payer: PPO | Admitting: Internal Medicine

## 2020-12-02 ENCOUNTER — Telehealth: Payer: Self-pay

## 2020-12-02 NOTE — Telephone Encounter (Signed)
Notified patient that Ozempic is ready for pick up.  Pt states she will come Monday Aug 8 to pick up.  Medication in vaccine fridge in nurse visit room.

## 2020-12-18 ENCOUNTER — Other Ambulatory Visit: Payer: Self-pay | Admitting: Internal Medicine

## 2020-12-18 DIAGNOSIS — I4891 Unspecified atrial fibrillation: Secondary | ICD-10-CM

## 2020-12-30 ENCOUNTER — Encounter: Payer: Self-pay | Admitting: Internal Medicine

## 2020-12-30 ENCOUNTER — Ambulatory Visit (INDEPENDENT_AMBULATORY_CARE_PROVIDER_SITE_OTHER): Payer: PPO | Admitting: Internal Medicine

## 2020-12-30 ENCOUNTER — Other Ambulatory Visit: Payer: Self-pay

## 2020-12-30 VITALS — BP 138/88 | HR 60 | Temp 97.6°F | Resp 16 | Ht 64.0 in | Wt 168.0 lb

## 2020-12-30 DIAGNOSIS — I10 Essential (primary) hypertension: Secondary | ICD-10-CM

## 2020-12-30 DIAGNOSIS — Z0000 Encounter for general adult medical examination without abnormal findings: Secondary | ICD-10-CM | POA: Diagnosis not present

## 2020-12-30 DIAGNOSIS — Z23 Encounter for immunization: Secondary | ICD-10-CM

## 2020-12-30 DIAGNOSIS — E785 Hyperlipidemia, unspecified: Secondary | ICD-10-CM | POA: Diagnosis not present

## 2020-12-30 DIAGNOSIS — E1159 Type 2 diabetes mellitus with other circulatory complications: Secondary | ICD-10-CM | POA: Diagnosis not present

## 2020-12-30 DIAGNOSIS — E119 Type 2 diabetes mellitus without complications: Secondary | ICD-10-CM

## 2020-12-30 DIAGNOSIS — I4891 Unspecified atrial fibrillation: Secondary | ICD-10-CM | POA: Diagnosis not present

## 2020-12-30 DIAGNOSIS — G63 Polyneuropathy in diseases classified elsewhere: Secondary | ICD-10-CM

## 2020-12-30 DIAGNOSIS — E538 Deficiency of other specified B group vitamins: Secondary | ICD-10-CM | POA: Diagnosis not present

## 2020-12-30 DIAGNOSIS — Z124 Encounter for screening for malignant neoplasm of cervix: Secondary | ICD-10-CM

## 2020-12-30 DIAGNOSIS — Z794 Long term (current) use of insulin: Secondary | ICD-10-CM | POA: Diagnosis not present

## 2020-12-30 DIAGNOSIS — Z0001 Encounter for general adult medical examination with abnormal findings: Secondary | ICD-10-CM

## 2020-12-30 LAB — HEPATIC FUNCTION PANEL
ALT: 13 U/L (ref 0–35)
AST: 15 U/L (ref 0–37)
Albumin: 4.4 g/dL (ref 3.5–5.2)
Alkaline Phosphatase: 75 U/L (ref 39–117)
Bilirubin, Direct: 0.1 mg/dL (ref 0.0–0.3)
Total Bilirubin: 0.6 mg/dL (ref 0.2–1.2)
Total Protein: 7.6 g/dL (ref 6.0–8.3)

## 2020-12-30 LAB — CBC WITH DIFFERENTIAL/PLATELET
Basophils Absolute: 0 10*3/uL (ref 0.0–0.1)
Basophils Relative: 0.5 % (ref 0.0–3.0)
Eosinophils Absolute: 0.1 10*3/uL (ref 0.0–0.7)
Eosinophils Relative: 0.9 % (ref 0.0–5.0)
HCT: 45.1 % (ref 36.0–46.0)
Hemoglobin: 14.9 g/dL (ref 12.0–15.0)
Lymphocytes Relative: 31.4 % (ref 12.0–46.0)
Lymphs Abs: 2.6 10*3/uL (ref 0.7–4.0)
MCHC: 33 g/dL (ref 30.0–36.0)
MCV: 87.4 fl (ref 78.0–100.0)
Monocytes Absolute: 0.4 10*3/uL (ref 0.1–1.0)
Monocytes Relative: 4.9 % (ref 3.0–12.0)
Neutro Abs: 5.1 10*3/uL (ref 1.4–7.7)
Neutrophils Relative %: 62.3 % (ref 43.0–77.0)
Platelets: 217 10*3/uL (ref 150.0–400.0)
RBC: 5.16 Mil/uL — ABNORMAL HIGH (ref 3.87–5.11)
RDW: 13.5 % (ref 11.5–15.5)
WBC: 8.2 10*3/uL (ref 4.0–10.5)

## 2020-12-30 LAB — URINALYSIS, ROUTINE W REFLEX MICROSCOPIC
Bilirubin Urine: NEGATIVE
Ketones, ur: NEGATIVE
Leukocytes,Ua: NEGATIVE
Nitrite: NEGATIVE
Specific Gravity, Urine: >=1.03 — AB (ref 1.000–1.030)
Total Protein, Urine: NEGATIVE
Urine Glucose: 500 — AB
Urobilinogen, UA: 0.2 (ref 0.0–1.0)
pH: 6 (ref 5.0–8.0)

## 2020-12-30 LAB — LIPID PANEL
Cholesterol: 171 mg/dL (ref 0–200)
HDL: 55.7 mg/dL (ref >39.00)
LDL Cholesterol: 89 mg/dL (ref 0–99)
NonHDL: 115.17
Total CHOL/HDL Ratio: 3
Triglycerides: 130 mg/dL (ref 0.0–149.0)
VLDL: 26 mg/dL (ref 0.0–40.0)

## 2020-12-30 LAB — BASIC METABOLIC PANEL
BUN: 11 mg/dL (ref 6–23)
CO2: 29 mEq/L (ref 19–32)
Calcium: 9.8 mg/dL (ref 8.4–10.5)
Chloride: 104 mEq/L (ref 96–112)
Creatinine, Ser: 0.76 mg/dL (ref 0.40–1.20)
GFR: 83.14 mL/min (ref >60.00)
Glucose, Bld: 58 mg/dL — ABNORMAL LOW (ref 70–99)
Potassium: 4 mEq/L (ref 3.5–5.1)
Sodium: 141 mEq/L (ref 135–145)

## 2020-12-30 LAB — MICROALBUMIN / CREATININE URINE RATIO
Creatinine,U: 129.5 mg/dL
Microalb Creat Ratio: 4.1 mg/g (ref 0.0–30.0)
Microalb, Ur: 5.3 mg/dL — ABNORMAL HIGH (ref 0.0–1.9)

## 2020-12-30 LAB — TSH: TSH: 0.54 u[IU]/mL (ref 0.35–5.50)

## 2020-12-30 LAB — HEMOGLOBIN A1C: Hgb A1c MFr Bld: 6.8 % — ABNORMAL HIGH (ref 4.6–6.5)

## 2020-12-30 LAB — FOLATE: Folate: 13 ng/mL (ref >5.9)

## 2020-12-30 MED ORDER — GVOKE HYPOPEN 2-PACK 1 MG/0.2ML ~~LOC~~ SOAJ: 1.0000 | Freq: Every day | SUBCUTANEOUS | 5 refills | Status: DC | PRN

## 2020-12-30 MED ORDER — CYANOCOBALAMIN 1000 MCG/ML IJ SOLN
1000.0000 ug | Freq: Once | INTRAMUSCULAR | Status: AC
  Administered 2020-12-30: 1000 ug via INTRAMUSCULAR

## 2020-12-30 MED ORDER — NEBIVOLOL HCL 5 MG PO TABS: 5.0000 mg | ORAL_TABLET | Freq: Every day | ORAL | 1 refills | Status: DC

## 2020-12-30 NOTE — Patient Instructions (Signed)
Health Maintenance, Female Adopting a healthy lifestyle and getting preventive care are important in promoting health and wellness. Ask your health care provider about: The right schedule for you to have regular tests and exams. Things you can do on your own to prevent diseases and keep yourself healthy. What should I know about diet, weight, and exercise? Eat a healthy diet  Eat a diet that includes plenty of vegetables, fruits, low-fat dairy products, and lean protein. Do not eat a lot of foods that are high in solid fats, added sugars, or sodium. Maintain a healthy weight Body mass index (BMI) is used to identify weight problems. It estimates body fat based on height and weight. Your health care provider can help determine your BMI and help you achieve or maintain a healthy weight. Get regular exercise Get regular exercise. This is one of the most important things you can do for your health. Most adults should: Exercise for at least 150 minutes each week. The exercise should increase your heart rate and make you sweat (moderate-intensity exercise). Do strengthening exercises at least twice a week. This is in addition to the moderate-intensity exercise. Spend less time sitting. Even light physical activity can be beneficial. Watch cholesterol and blood lipids Have your blood tested for lipids and cholesterol at 64 years of age, then have this test every 5 years. Have your cholesterol levels checked more often if: Your lipid or cholesterol levels are high. You are older than 64 years of age. You are at high risk for heart disease. What should I know about cancer screening? Depending on your health history and family history, you may need to have cancer screening at various ages. This may include screening for: Breast cancer. Cervical cancer. Colorectal cancer. Skin cancer. Lung cancer. What should I know about heart disease, diabetes, and high blood pressure? Blood pressure and heart  disease High blood pressure causes heart disease and increases the risk of stroke. This is more likely to develop in people who have high blood pressure readings, are of African descent, or are overweight. Have your blood pressure checked: Every 3-5 years if you are 18-39 years of age. Every year if you are 40 years old or older. Diabetes Have regular diabetes screenings. This checks your fasting blood sugar level. Have the screening done: Once every three years after age 40 if you are at a normal weight and have a low risk for diabetes. More often and at a younger age if you are overweight or have a high risk for diabetes. What should I know about preventing infection? Hepatitis B If you have a higher risk for hepatitis B, you should be screened for this virus. Talk with your health care provider to find out if you are at risk for hepatitis B infection. Hepatitis C Testing is recommended for: Everyone born from 1945 through 1965. Anyone with known risk factors for hepatitis C. Sexually transmitted infections (STIs) Get screened for STIs, including gonorrhea and chlamydia, if: You are sexually active and are younger than 64 years of age. You are older than 64 years of age and your health care provider tells you that you are at risk for this type of infection. Your sexual activity has changed since you were last screened, and you are at increased risk for chlamydia or gonorrhea. Ask your health care provider if you are at risk. Ask your health care provider about whether you are at high risk for HIV. Your health care provider may recommend a prescription medicine   to help prevent HIV infection. If you choose to take medicine to prevent HIV, you should first get tested for HIV. You should then be tested every 3 months for as long as you are taking the medicine. Pregnancy If you are about to stop having your period (premenopausal) and you may become pregnant, seek counseling before you get  pregnant. Take 400 to 800 micrograms (mcg) of folic acid every day if you become pregnant. Ask for birth control (contraception) if you want to prevent pregnancy. Osteoporosis and menopause Osteoporosis is a disease in which the bones lose minerals and strength with aging. This can result in bone fractures. If you are 65 years old or older, or if you are at risk for osteoporosis and fractures, ask your health care provider if you should: Be screened for bone loss. Take a calcium or vitamin D supplement to lower your risk of fractures. Be given hormone replacement therapy (HRT) to treat symptoms of menopause. Follow these instructions at home: Lifestyle Do not use any products that contain nicotine or tobacco, such as cigarettes, e-cigarettes, and chewing tobacco. If you need help quitting, ask your health care provider. Do not use street drugs. Do not share needles. Ask your health care provider for help if you need support or information about quitting drugs. Alcohol use Do not drink alcohol if: Your health care provider tells you not to drink. You are pregnant, may be pregnant, or are planning to become pregnant. If you drink alcohol: Limit how much you use to 0-1 drink a day. Limit intake if you are breastfeeding. Be aware of how much alcohol is in your drink. In the [place] one drink equals one 12 oz bottle of beer (355 mL), one 5 oz glass of wine (148 mL), or one 1 oz glass of hard liquor (44 mL). General instructions Schedule regular health, dental, and eye exams. Stay current with your vaccines. Tell your health care provider if: You often feel depressed. You have ever been abused or do not feel safe at home. Summary Adopting a healthy lifestyle and getting preventive care are important in promoting health and wellness. Follow your health care provider's instructions about healthy diet, exercising, and getting tested or screened for diseases. Follow your health care provider's  instructions on monitoring your cholesterol and blood pressure. This information is not intended to replace advice given to you by your health care provider. Make sure you discuss any questions you have with your health care provider. Document Revised: 06/25/2020 Document Reviewed: 04/10/2018 Elsevier Patient Education  2022 [employer]  

## 2020-12-30 NOTE — Progress Notes (Signed)
Subjective:  Patient ID: Theresa Harrington, female    DOB: Oct 27, 1955  Age: 65 y.o. MRN: [id number]  CC: Annual Exam, Hypertension, Hyperlipidemia, and Diabetes  This visit occurred during the SARS-CoV-2 public health emergency.  Safety protocols were in place, including screening questions prior to the visit, additional usage of staff PPE, and extensive cleaning of exam room while observing appropriate contact time as indicated for disinfecting solutions.    HPI Ravon Carp presents for a CPX and f/up -   She is active and denies any recent episodes of palpitations, dizziness, lightheadedness, presyncope, chest pain, shortness of breath, edema, or polys.  Outpatient Medications Prior to Visit  Medication Sig Dispense Refill   Blood Glucose Monitoring Suppl (ONETOUCH VERIO IQ SYSTEM) w/Device KIT USE TO CHECK SUGAR DAILY 1 kit 0   cetirizine (ZYRTEC) 10 MG tablet Take 10 mg by mouth daily as needed for allergies.     dorzolamide-timolol (COSOPT) 22.3-6.8 MG/ML ophthalmic solution Place 1 drop into the right eye 2 (two) times daily.      Empagliflozin-metFORMIN HCl ER (SYNJARDY XR) 12.08-998 MG TB24 Take 1 tablet by mouth daily. Via [employer] patient assistance 90 tablet 1   ezetimibe (ZETIA) 10 MG tablet TAKE ONE TABLET BY MOUTH EVERYDAY AT BEDTIME 90 tablet 1   glucose blood test strip Use to check blood sugar twice daily. DX: E11.8 200 each 3   Insulin Glargine (BASAGLAR KWIKPEN) 100 UNIT/ML Inject 50 Units into the skin at bedtime. Via [employer] patient assistance 30 mL 2   insulin lispro (HUMALOG) 100 UNIT/ML injection Inject 14-20 Units into the skin 3 (three) times daily before meals. [place] patient assistance     Insulin Pen Needle (PEN NEEDLES) 32G X 4 MM MISC Inject 1 pen as directed 4 (four) times daily. Use to inject levemir or novolog as directed. 400 each 3   latanoprost (XALATAN) 0.005 % ophthalmic solution Place 1 drop into both eyes at bedtime.       losartan-hydrochlorothiazide (HYZAAR) 100-12.5 MG tablet TAKE ONE TABLET BY MOUTH EVERYDAY AT BEDTIME 90 tablet 1   magnesium oxide (MAG-OX) 400 MG tablet TAKE ONE TABLET BY MOUTH EVERYDAY AT BEDTIME 90 tablet 1   omeprazole (PRILOSEC) 40 MG capsule TAKE ONE CAPSULE BY MOUTH EVERY MORNING and TAKE ONE CAPSULE BY MOUTH EVERY EVENING 180 capsule 1   potassium chloride SA (KLOR-CON) 20 MEQ tablet TAKE ONE TABLET BY MOUTH EVERY MORNING 90 tablet 1   pravastatin (PRAVACHOL) 40 MG tablet TAKE ONE TABLET BY MOUTH EVERYDAY AT BEDTIME 90 tablet 1   Semaglutide, 1 MG/DOSE, (OZEMPIC, 1 MG/DOSE,) 4 MG/3ML SOPN Inject 1 mg into the skin once a week. Via [employer] patient assistance 9 mL 1   thiamine 100 MG tablet Take 1 tablet (100 mg total) by mouth every other day. 45 tablet 1   Vilazodone HCl (VIIBRYD STARTER PACK) 10 & 20 MG KIT Take 1 tablet by mouth daily. 1 kit 0   XARELTO 20 MG TABS tablet TAKE 1 TABLET BY MOUTH EVERY DAY 90 tablet 0   mupirocin ointment (BACTROBAN) 2 % Apply 1 application topically daily.      nebivolol (BYSTOLIC) 5 MG tablet Take 1 tablet (5 mg total) by mouth daily. Via [place] [employer] patient assistance 90 tablet 1   No facility-administered medications prior to visit.    ROS Review of Systems  Constitutional:  Negative for diaphoresis, fatigue and fever.  HENT: Negative.    Eyes: Negative.  Respiratory:  Negative for cough, chest tightness, shortness of breath and wheezing.   Cardiovascular:  Negative for chest pain, palpitations and leg swelling.  Gastrointestinal:  Negative for abdominal pain, constipation, diarrhea, nausea and vomiting.  Endocrine: Negative for cold intolerance, heat intolerance, polydipsia, polyphagia and polyuria.  Genitourinary: Negative.  Negative for decreased urine volume, difficulty urinating, dysuria, frequency, hematuria and urgency.  Musculoskeletal:  Negative for arthralgias and myalgias.  Skin: Negative.   Neurological:  Negative for  dizziness, syncope, speech difficulty, weakness, light-headedness and headaches.  Hematological:  Negative for adenopathy. Does not bruise/bleed easily.  Psychiatric/Behavioral: Negative.     Objective:  BP 138/88 (BP Location: Left Arm, Patient Position: Sitting, Cuff Size: Large)   Pulse 60   Temp 97.6 F (36.4 C) (Oral)   Resp 16   Ht _0  (1.626 m)   Wt 168 lb (76.2 kg)   LMP  (LMP Unknown)   SpO2 99%   BMI 28.84 kg/m   BP Readings from Last 3 Encounters:  12/30/20 138/88  10/25/20 (!) 184/81  06/03/20 111/62    Wt Readings from Last 3 Encounters:  12/30/20 168 lb (76.2 kg)  10/25/20 168 lb 4.8 oz (76.3 kg)  06/03/20 171 lb (77.6 kg)    Physical Exam Vitals reviewed.  Constitutional:      Appearance: Normal appearance.  HENT:     Nose: Nose normal.     Mouth/Throat:     Mouth: Mucous membranes are moist.  Eyes:     General: No scleral icterus.    Conjunctiva/sclera: Conjunctivae normal.  Cardiovascular:     Rate and Rhythm: Normal rate and regular rhythm.     Heart sounds: No murmur heard. Pulmonary:     Effort: Pulmonary effort is normal.     Breath sounds: No stridor. No wheezing, rhonchi or rales.  Abdominal:     General: Abdomen is flat.     Palpations: There is no mass.     Tenderness: There is no abdominal tenderness. There is no guarding.     Hernia: No hernia is present.  Musculoskeletal:        General: Normal range of motion.     Cervical back: Neck supple.     Right lower leg: No edema.     Left lower leg: No edema.  Lymphadenopathy:     Cervical: No cervical adenopathy.  Skin:    General: Skin is warm and dry.  Neurological:     General: No focal deficit present.     Mental Status: She is alert.  Psychiatric:        Mood and Affect: Mood normal.        Behavior: Behavior normal.    Lab Results  Component Value Date   WBC 8.2 12/30/2020   HGB 14.9 12/30/2020   HCT 45.1 12/30/2020   PLT 217.0 12/30/2020   GLUCOSE 58 (L)  12/30/2020   CHOL 171 12/30/2020   TRIG 130.0 12/30/2020   HDL 55.70 12/30/2020   LDLDIRECT 160.2 03/01/2010   LDLCALC 89 12/30/2020   ALT 13 12/30/2020   AST 15 12/30/2020   NA 141 12/30/2020   K 4.0 12/30/2020   CL 104 12/30/2020   CREATININE 0.76 12/30/2020   BUN 11 12/30/2020   CO2 29 12/30/2020   TSH 0.54 12/30/2020   INR 1.3 (H) 03/08/2020   HGBA1C 6.8 (H) 12/30/2020   MICROALBUR 5.3 (H) 12/30/2020    VAS [place] ABI WITH/WO TBI  Result Date: 10/25/2020  LOWER  EXTREMITY DOPPLER STUDY Patient Name:  Caroleena Paolini  Date of Exam:   10/25/2020 Medical Rec #: [id number]   Accession #:    [id number] Date of Birth: 1955-12-26  Patient Gender: F Patient Age:   64Y Exam Location:  [facility] Procedure:      VAS [place] ABI WITH/WO TBI Referring Phys: --------------------------------------------------------------------------------  Indications: Ulceration, and peripheral artery disease. High Risk Factors: Hypertension, hyperlipidemia. Other Factors: Right foot ulceration.  Vascular Interventions: Hx occluded left Fem Pop bypass graft.                         Left ATA endarterectomy. Left pop thrombectomy. Left                         CFA, EIA angioplasty.                          Right EIA 50-99% stenosis. Performing Technologist: Ronal Fear RVS, RCS  Examination Guidelines: A complete evaluation includes at minimum, Doppler waveform signals and systolic blood pressure reading at the level of bilateral brachial, anterior tibial, and posterior tibial arteries, when vessel segments are accessible. Bilateral testing is considered an integral part of a complete examination. Photoelectric Plethysmograph (PPG) waveforms and toe systolic pressure readings are included as required and additional duplex testing as needed. Limited examinations for reoccurring indications may be performed as noted.  ABI Findings: +---------+------------------+-----+----------+--------+ Right    Rt Pressure  (mmHg)IndexWaveform  Comment  +---------+------------------+-----+----------+--------+ Brachial 187                                       +---------+------------------+-----+----------+--------+ PTA      120               0.63 monophasic         +---------+------------------+-----+----------+--------+ DP       91                0.48 monophasic         +---------+------------------+-----+----------+--------+ Great Toe38                0.20                    +---------+------------------+-----+----------+--------+ +---------+------------------+-----+----------+-------+ Left     Lt Pressure (mmHg)IndexWaveform  Comment +---------+------------------+-----+----------+-------+ Brachial 190                                      +---------+------------------+-----+----------+-------+ PTA      139               0.73 biphasic          +---------+------------------+-----+----------+-------+ DP       111               0.58 monophasic        +---------+------------------+-----+----------+-------+ Great Toe68                0.36                   +---------+------------------+-----+----------+-------+ +-------+-----------+-----------+------------+------------+ ABI/TBIToday's ABIToday's TBIPrevious ABIPrevious TBI +-------+-----------+-----------+------------+------------+ Right  0.63       0.20       0.75  0.30         +-------+-----------+-----------+------------+------------+ Left   0.73       0.36       0.76        0.46         +-------+-----------+-----------+------------+------------+  Bilateral ABIs appear essentially unchanged compared to prior study on 03/01/2020.  Summary: Right: Resting right ankle-brachial index indicates moderate right lower extremity arterial disease. The right toe-brachial index is abnormal. Left: Resting left ankle-brachial index indicates moderate left lower extremity arterial disease. The left toe-brachial index is  abnormal.  *See table(s) above for measurements and observations.  Electronically signed by Harold Barban MD on 10/25/2020 at 5:32:43 PM.    Final    VAS US CAROTID  Result Date: 10/25/2020 Carotid Arterial Duplex Study Patient Name:  DAMA HEDGEPETH  Date of Exam:   10/25/2020 Medical Rec #: [id number]   Accession #:    [id number] Date of Birth: Jun 08, 1955  Patient Gender: F Patient Age:   64Y Exam Location:  [facility] Procedure:      VAS US CAROTID Referring Phys: [id number] Serafina Mitchell --------------------------------------------------------------------------------  Indications:   Carotid artery disease. Risk Factors:  Hypertension, Diabetes. Other Factors: Lower extremity revascularizations. Performing Technologist: Ronal Fear RVS, RCS  Examination Guidelines: A complete evaluation includes B-mode imaging, spectral Doppler, color Doppler, and power Doppler as needed of all accessible portions of each vessel. Bilateral testing is considered an integral part of a complete examination. Limited examinations for reoccurring indications may be performed as noted.  Right Carotid Findings: +----------+--------+--------+--------+------------------+--------+           PSV cm/sEDV cm/sStenosisPlaque DescriptionComments +----------+--------+--------+--------+------------------+--------+ CCA Prox  84      16                                         +----------+--------+--------+--------+------------------+--------+ CCA Mid   53      18                                         +----------+--------+--------+--------+------------------+--------+ CCA Distal48      14              heterogenous               +----------+--------+--------+--------+------------------+--------+ ICA Prox  77      24      1-39%   heterogenous               +----------+--------+--------+--------+------------------+--------+ ICA Mid   72      23                                          +----------+--------+--------+--------+------------------+--------+ ICA Distal91      31                                         +----------+--------+--------+--------+------------------+--------+ ECA       329             >50%    heterogenous               +----------+--------+--------+--------+------------------+--------+ +----------+--------+-------+----------------+-------------------+  PSV cm/sEDV cmsDescribe        Arm Pressure (mmHG) +----------+--------+-------+----------------+-------------------+ Subclavian162            Multiphasic, WNL                    +----------+--------+-------+----------------+-------------------+ +---------+--------+--+--------+--+---------+ VertebralPSV cm/s85EDV cm/s20Antegrade +---------+--------+--+--------+--+---------+  Left Carotid Findings: +----------+--------+--------+--------+------------------+--------+           PSV cm/sEDV cm/sStenosisPlaque DescriptionComments +----------+--------+--------+--------+------------------+--------+ CCA Prox  88      18                                         +----------+--------+--------+--------+------------------+--------+ CCA Mid   67      16              heterogenous               +----------+--------+--------+--------+------------------+--------+ CCA Distal58      15              calcific                   +----------+--------+--------+--------+------------------+--------+ ICA Prox  67      15      1-39%   calcific                   +----------+--------+--------+--------+------------------+--------+ ICA Mid   87      28                                         +----------+--------+--------+--------+------------------+--------+ ICA Distal107     32                                         +----------+--------+--------+--------+------------------+--------+ ECA       163     14                                          +----------+--------+--------+--------+------------------+--------+ +----------+--------+--------+----------------+-------------------+           PSV cm/sEDV cm/sDescribe        Arm Pressure (mmHG) +----------+--------+--------+----------------+-------------------+ Subclavian101             Multiphasic, WNL                    +----------+--------+--------+----------------+-------------------+ +---------+--------+--+--------+-+---------+ VertebralPSV cm/s62EDV cm/s9Antegrade +---------+--------+--+--------+-+---------+   Summary: Right Carotid: Velocities in the right ICA are consistent with a 1-39% stenosis. Left Carotid: Velocities in the left ICA are consistent with a 1-39% stenosis. Vertebrals:  Bilateral vertebral arteries demonstrate antegrade flow. Subclavians: Normal flow hemodynamics were seen in bilateral subclavian              arteries. *See table(s) above for measurements and observations.  Electronically signed by Harold Barban MD on 10/25/2020 at 5:28:41 PM.    Final     Assessment & Plan:   Nonnie was seen today for annual exam, hypertension, hyperlipidemia and diabetes.  Diagnoses and all orders for this visit:  Type 2 diabetes mellitus with other circulatory complication, with long-term current use of insulin ([place]) -     HM Diabetes Foot Exam -  Basic metabolic panel; Future -     Microalbumin / creatinine urine ratio; Future -     Urinalysis, Routine w reflex microscopic; Future -     Hemoglobin A1c; Future -     Hemoglobin A1c -     Urinalysis, Routine w reflex microscopic -     Microalbumin / creatinine urine ratio -     Basic metabolic panel  Insulin-requiring or dependent type II diabetes mellitus ([place])- Her blood sugar is adequately well controlled.  Will continue the current regimen. -     Microalbumin / creatinine urine ratio; Future -     Urinalysis, Routine w reflex microscopic; Future -     Hemoglobin A1c; Future -     Glucagon (GVOKE HYPOPEN  2-PACK) 1 MG/0.2ML SOAJ; Inject 1 Act into the skin daily as needed. -     Hemoglobin A1c -     Urinalysis, Routine w reflex microscopic -     Microalbumin / creatinine urine ratio  Vitamin B12 deficiency neuropathy (HCC)-she will continue parenteral B12 replacement therapy. -     Folate; Future -     Folate  Hyperlipidemia with target LDL less than 100- LDL goal achieved. Doing well on the statin  -     Lipid panel; Future -     TSH; Future -     Hepatic function panel; Future -     Hepatic function panel -     TSH -     Lipid panel  Essential hypertension, benign- Her blood pressure is adequately well controlled.  Electrolytes and renal function are normal. -     Basic metabolic panel; Future -     CBC with Differential/Platelet; Future -     Urinalysis, Routine w reflex microscopic; Future -     TSH; Future -     nebivolol (BYSTOLIC) 5 MG tablet; Take 1 tablet (5 mg total) by mouth daily. -     TSH -     Urinalysis, Routine w reflex microscopic -     CBC with Differential/Platelet -     Basic metabolic panel  Atrial fibrillation with RVR ([place])- She has good rate and rhythm control.  Will continue the DOAC. -     nebivolol (BYSTOLIC) 5 MG tablet; Take 1 tablet (5 mg total) by mouth daily.  B12 deficiency -     cyanocobalamin ((VITAMIN B-12)) injection 1,000 mcg  Other orders -     Flu Vaccine QUAD 6+ mos PF IM (Fluarix Quad PF) -     Cancel: Flu Vaccine QUAD 6+ mos PF IM (Fluarix Quad PF)  I have discontinued Pachia Cofer's mupirocin ointment. I have also changed her nebivolol. Additionally, I am having her start on Gvoke HypoPen 2-Pack. Lastly, I am having her maintain her Pen Needles, latanoprost, OneTouch Verio IQ System, dorzolamide-timolol, thiamine, insulin lispro, glucose blood, cetirizine, Viibryd Starter Pack, potassium chloride SA, losartan-hydrochlorothiazide, pravastatin, ezetimibe, omeprazole, magnesium oxide, Synjardy XR, Basaglar KwikPen, Ozempic (1 MG/DOSE), and  Xarelto. We administered cyanocobalamin.  Meds ordered this encounter  Medications   Glucagon (GVOKE HYPOPEN 2-PACK) 1 MG/0.2ML SOAJ    Sig: Inject 1 Act into the skin daily as needed.    Dispense:  2 mL    Refill:  5   nebivolol (BYSTOLIC) 5 MG tablet    Sig: Take 1 tablet (5 mg total) by mouth daily.    Dispense:  90 tablet    Refill:  1   cyanocobalamin ((VITAMIN B-12)) injection 1,000 mcg  Follow-up: Return in about 3 months (around 03/31/2021).  Scarlette Calico, MD

## 2020-12-31 ENCOUNTER — Encounter: Payer: Self-pay | Admitting: Internal Medicine

## 2021-01-01 DIAGNOSIS — Z0001 Encounter for general adult medical examination with abnormal findings: Secondary | ICD-10-CM | POA: Insufficient documentation

## 2021-01-01 DIAGNOSIS — E538 Deficiency of other specified B group vitamins: Secondary | ICD-10-CM | POA: Insufficient documentation

## 2021-01-01 NOTE — Assessment & Plan Note (Signed)
Exam completed Labs reviewed Vaccines reviewed and updated Cancer screenings addressed Patient education was given. 

## 2021-01-04 ENCOUNTER — Telehealth: Payer: Self-pay

## 2021-01-04 NOTE — Telephone Encounter (Signed)
Key: [id number]

## 2021-01-06 NOTE — Telephone Encounter (Signed)
Per CoverMyMeds: ? ?PA was denied.  ?

## 2021-01-09 ENCOUNTER — Other Ambulatory Visit: Payer: Self-pay | Admitting: Internal Medicine

## 2021-01-09 DIAGNOSIS — Z794 Long term (current) use of insulin: Secondary | ICD-10-CM

## 2021-01-09 DIAGNOSIS — E119 Type 2 diabetes mellitus without complications: Secondary | ICD-10-CM

## 2021-01-10 ENCOUNTER — Other Ambulatory Visit: Payer: Self-pay | Admitting: Internal Medicine

## 2021-01-10 ENCOUNTER — Telehealth: Payer: Self-pay | Admitting: Internal Medicine

## 2021-01-10 DIAGNOSIS — I4891 Unspecified atrial fibrillation: Secondary | ICD-10-CM

## 2021-01-10 MED ORDER — RIVAROXABAN 20 MG PO TABS: 20.0000 mg | ORAL_TABLET | Freq: Every day | ORAL | 1 refills | Status: DC

## 2021-01-10 NOTE — Telephone Encounter (Signed)
1.Medication Requested: XARELTO 20 MG TABS tablet  2. Pharmacy (Name, Street, [place]):  [facility], [place] Phone:  [phone]  Fax:  [phone]      3. On Med List: Y  4. Last Visit with PCP:  9.1.22  5. Next visit date with PCP: not scheduled   Agent: Please be advised that RX refills may take up to 3 business days. We ask that you follow-up with your pharmacy.   Patient states that her blood pressure was not discussed at her last visit, so she did not pick the below meds up: nebivolol (BYSTOLIC) 5 MG tablet

## 2021-01-13 ENCOUNTER — Telehealth: Payer: Self-pay | Admitting: Internal Medicine

## 2021-01-13 DIAGNOSIS — Z8631 Personal history of diabetic foot ulcer: Secondary | ICD-10-CM | POA: Diagnosis not present

## 2021-01-13 DIAGNOSIS — E1151 Type 2 diabetes mellitus with diabetic peripheral angiopathy without gangrene: Secondary | ICD-10-CM | POA: Diagnosis not present

## 2021-01-13 DIAGNOSIS — S98132A Complete traumatic amputation of one left lesser toe, initial encounter: Secondary | ICD-10-CM | POA: Diagnosis not present

## 2021-01-13 DIAGNOSIS — E119 Type 2 diabetes mellitus without complications: Secondary | ICD-10-CM | POA: Diagnosis not present

## 2021-01-13 NOTE — Telephone Encounter (Signed)
   Patient calling to report BP  Patient reports no symptoms, 157/75, 170/6?  Appointment 9/16 with NP Pearline Cables

## 2021-01-14 ENCOUNTER — Ambulatory Visit (INDEPENDENT_AMBULATORY_CARE_PROVIDER_SITE_OTHER): Payer: PPO | Admitting: Nurse Practitioner

## 2021-01-14 ENCOUNTER — Encounter: Payer: Self-pay | Admitting: Nurse Practitioner

## 2021-01-14 ENCOUNTER — Other Ambulatory Visit: Payer: Self-pay

## 2021-01-14 VITALS — BP 162/78 | HR 58 | Temp 97.8°F | Ht 64.0 in | Wt 169.0 lb

## 2021-01-14 DIAGNOSIS — I10 Essential (primary) hypertension: Secondary | ICD-10-CM

## 2021-01-14 MED ORDER — LOSARTAN POTASSIUM-HCTZ 100-25 MG PO TABS: 1.0000 | ORAL_TABLET | Freq: Every day | ORAL | 1 refills | Status: DC

## 2021-01-14 NOTE — Progress Notes (Signed)
Subjective:  Patient ID: Theresa Harrington, female    DOB: [birth year]  Age: 65 y.o. MRN: [id number]  CC:  Chief Complaint  Patient presents with   Hypertension      HPI  This patient arrives today for the above.  She reports that she is been checking her blood pressure at home over the last 2 weeks and has noticed that has been elevated.  She tells me her systolic blood pressure ranges between 1 37-9 70 and diastolic blood pressure is usually around 60-70.  She is currently on Bystolic 5 mg by mouth daily as well as losartan-hydrochlorothiazide 100-12.5 mg daily.  She tells me she accidentally doubled up on her Bystolic the last 2 days and took 10 mg by mouth daily.  She has had intermittent mild headaches, but denies any blurry vision, severe headache, chest pain, worsening fatigue, or difficulty breathing.  Past Medical History:  Diagnosis Date   Allergy    Anxiety    Pt. denies   Arthritis    right index figer   Asymptomatic cholelithiasis    Atherosclerosis of aorta (HCC)    Cataract    Clotting disorder ([place])    Gallstones 06/2013   GERD (gastroesophageal reflux disease)    History of kidney stones    lithotrispy   Hyperlipidemia    Hypertension    Hypothyroidism    no meds   Iron deficiency anemia    PAD (peripheral artery disease) (HCC)    PAF (paroxysmal atrial fibrillation) (HCC)    Peripheral arterial occlusive disease (HCC)    lower extremities   Pneumonia    PONV (postoperative nausea and vomiting)    Right ureteral stone    Type 2 diabetes mellitus ([place])    Type   Vitamin B 12 deficiency    Wears glasses       Family History  Problem Relation Age of Onset   Diabetes Father    Heart disease Father    Deep vein thrombosis Father    Hyperlipidemia Father    Diabetes Sister    Heart disease Sister    Deep vein thrombosis Sister    Hyperlipidemia Sister    Diabetes Brother    Heart disease Brother    Cancer Brother    Hyperlipidemia Brother     Colon cancer Maternal Aunt    Diabetes Brother    Diabetes Brother    Kidney disease Mother    Hyperlipidemia Mother    Other Mother        AAA   and    Amputation   Arthritis Other    Cancer Other        colon   Hypertension Other    Stroke Other    Esophageal cancer Neg Hx    Stomach cancer Neg Hx    Inflammatory bowel disease Neg Hx    Liver disease Neg Hx    Pancreatic cancer Neg Hx     Social History   Social History Narrative   Regular exercise- yes   Social History   Tobacco Use   Smoking status: Former    Packs/day: 1.00    Years: 28.00    Pack years: 28.00    Types: Cigarettes    Quit date: 09/05/2001    Years since quitting: 19.3   Smokeless tobacco: Never  Substance Use Topics   Alcohol use: No    Alcohol/week: 0.0 standard drinks     Current Meds  Medication Sig  Blood Glucose Monitoring Suppl (ONETOUCH VERIO IQ SYSTEM) w/Device KIT USE TO CHECK SUGAR DAILY   cetirizine (ZYRTEC) 10 MG tablet Take 10 mg by mouth daily as needed for allergies.   dorzolamide-timolol (COSOPT) 22.3-6.8 MG/ML ophthalmic solution Place 1 drop into the right eye 2 (two) times daily.    Empagliflozin-metFORMIN HCl ER (SYNJARDY XR) 12.08-998 MG TB24 Take 1 tablet by mouth daily. Via [employer] patient assistance   ezetimibe (ZETIA) 10 MG tablet TAKE ONE TABLET BY MOUTH EVERYDAY AT BEDTIME   Glucagon (GVOKE HYPOPEN 2-PACK) 1 MG/0.2ML SOAJ Inject 1 Act into the skin daily as needed.   glucose blood test strip Use to check blood sugar twice daily. DX: E11.8   Insulin Glargine (BASAGLAR KWIKPEN) 100 UNIT/ML Inject 50 Units into the skin at bedtime. [place] patient assistance   insulin lispro (HUMALOG) 100 UNIT/ML injection Inject 14-20 Units into the skin 3 (three) times daily before meals. [place] patient assistance   Insulin Pen Needle (PEN NEEDLES) 32G X 4 MM MISC Inject 1 pen as directed 4 (four) times daily. Use to inject levemir or novolog as directed.    latanoprost (XALATAN) 0.005 % ophthalmic solution Place 1 drop into both eyes at bedtime.    losartan-hydrochlorothiazide (HYZAAR) 100-25 MG tablet Take 1 tablet by mouth daily.   magnesium oxide (MAG-OX) 400 MG tablet TAKE ONE TABLET BY MOUTH EVERYDAY AT BEDTIME   nebivolol (BYSTOLIC) 5 MG tablet Take 1 tablet (5 mg total) by mouth daily.   omeprazole (PRILOSEC) 40 MG capsule TAKE ONE CAPSULE BY MOUTH EVERY MORNING and TAKE ONE CAPSULE BY MOUTH EVERY EVENING   potassium chloride SA (KLOR-CON) 20 MEQ tablet TAKE ONE TABLET BY MOUTH EVERY MORNING   pravastatin (PRAVACHOL) 40 MG tablet TAKE ONE TABLET BY MOUTH EVERYDAY AT BEDTIME   rivaroxaban (XARELTO) 20 MG TABS tablet Take 1 tablet (20 mg total) by mouth daily.   Semaglutide, 1 MG/DOSE, (OZEMPIC, 1 MG/DOSE,) 4 MG/3ML SOPN Inject 1 mg into the skin once a week. Via [employer] patient assistance   thiamine 100 MG tablet Take 1 tablet (100 mg total) by mouth every other day.   [DISCONTINUED] losartan-hydrochlorothiazide (HYZAAR) 100-12.5 MG tablet TAKE ONE TABLET BY MOUTH EVERYDAY AT BEDTIME    ROS:  Review of Systems  Constitutional:  Negative for malaise/fatigue.  Eyes:  Negative for blurred vision.  Cardiovascular:  Positive for palpitations (intermittent). Negative for chest pain.  Neurological:  Positive for headaches. Negative for dizziness.    Objective:   Today's Vitals: BP (!) 162/78   Pulse (!) 58   Temp 97.8 F (36.6 C) (Oral)   Ht '5\' 4"'  (1.626 m)   Wt 169 lb (76.7 kg)   LMP  (LMP Unknown)   SpO2 99%   BMI 29.01 kg/m  Vitals with BMI 01/14/2021 12/30/2020 10/25/2020  Height '5\' 4"'  '5\' 4"'  -  Weight 169 lbs 168 lbs -  BMI 57.01 77.93 -  Systolic 903 009 233  Diastolic 78 88 81  Pulse 58 60 -     Physical Exam Vitals reviewed.  Constitutional:      General: She is not in acute distress.    Appearance: Normal appearance.  HENT:     Head: Normocephalic and atraumatic.  Neck:     Vascular: No carotid bruit.   Cardiovascular:     Rate and Rhythm: Normal rate and regular rhythm.     Pulses: Normal pulses.     Heart sounds: Normal heart  sounds.  Pulmonary:     Effort: Pulmonary effort is normal.     Breath sounds: Normal breath sounds.  Skin:    General: Skin is warm and dry.  Neurological:     General: No focal deficit present.     Mental Status: She is alert and oriented to person, place, and time.  Psychiatric:        Mood and Affect: Mood normal.        Behavior: Behavior normal.        Judgment: Judgment normal.         Assessment and Plan   1. Essential hypertension, benign      Plan: 1.  I recommend she take 5 mg of Bystolic daily and we will increase her losartan-hydrochlorothiazide to 100-25 mg a mouth daily.  She will follow-up in 7 to 10 days for blood pressure check as well as to recheck metabolic panel.  She is encouraged to check her blood pressure once a day at home and keep a log of this and bring this to her next appointment.  She was also encouraged to call the office and/or proceed to the emergency department if she experiences any severe headaches, chest pain, fatigue, or blurry vision.  She tells me she understands.   Tests ordered No orders of the defined types were placed in this encounter.     Meds ordered this encounter  Medications   losartan-hydrochlorothiazide (HYZAAR) 100-25 MG tablet    Sig: Take 1 tablet by mouth daily.    Dispense:  30 tablet    Refill:  1    Order Specific Question:   Supervising Provider    Answer:   Binnie Rail [id number]    Patient to follow-up in 7-10 days for BP check and CMP check to monitor kidney function and electrolytes.   Ailene Ards, NP

## 2021-01-21 ENCOUNTER — Ambulatory Visit (INDEPENDENT_AMBULATORY_CARE_PROVIDER_SITE_OTHER): Payer: PPO | Admitting: Internal Medicine

## 2021-01-21 ENCOUNTER — Other Ambulatory Visit: Payer: Self-pay

## 2021-01-21 ENCOUNTER — Encounter: Payer: Self-pay | Admitting: Internal Medicine

## 2021-01-21 VITALS — BP 128/76 | HR 98 | Temp 98.1°F | Resp 16 | Ht 64.0 in | Wt 166.4 lb

## 2021-01-21 DIAGNOSIS — I4891 Unspecified atrial fibrillation: Secondary | ICD-10-CM

## 2021-01-21 DIAGNOSIS — I10 Essential (primary) hypertension: Secondary | ICD-10-CM | POA: Diagnosis not present

## 2021-01-21 MED ORDER — DILTIAZEM HCL ER COATED BEADS 120 MG PO CP24: 120.0000 mg | ORAL_CAPSULE | Freq: Every day | ORAL | 0 refills | Status: DC

## 2021-01-21 NOTE — Progress Notes (Signed)
Subjective:  Patient ID: Theresa Harrington, female    DOB: 12/11/1955  Age: 65 y.o. MRN: [id number]  CC: Blood Pressure Check (Pt stated this morning 68/56. Previous readings 105/70,114/74, 88/62, 102/82, 121/61, 100/59, 114/63. She is concerned about her diastolic. )  This visit occurred during the SARS-CoV-2 public health emergency.  Safety protocols were in place, including screening questions prior to the visit, additional usage of staff PPE, and extensive cleaning of exam room while observing appropriate contact time as indicated for disinfecting solutions.    HPI Theresa Harrington presents for f/up -  She recently saw someone else for the concern of elevated blood pressure.  She was placed on an ARB and thiazide diuretic.  Since starting that combination she has had a blood pressure down to 100/60 associated with lightheadedness.  She denies palpitations, near-syncope, edema, or fatigue.  Outpatient Medications Prior to Visit  Medication Sig Dispense Refill   Blood Glucose Monitoring Suppl (ONETOUCH VERIO IQ SYSTEM) w/Device KIT USE TO CHECK SUGAR DAILY 1 kit 0   cetirizine (ZYRTEC) 10 MG tablet Take 10 mg by mouth daily as needed for allergies.     dorzolamide-timolol (COSOPT) 22.3-6.8 MG/ML ophthalmic solution Place 1 drop into the right eye 2 (two) times daily.      Empagliflozin-metFORMIN HCl ER (SYNJARDY XR) 12.08-998 MG TB24 Take 1 tablet by mouth daily. Via [employer] patient assistance 90 tablet 1   ezetimibe (ZETIA) 10 MG tablet TAKE ONE TABLET BY MOUTH EVERYDAY AT BEDTIME 90 tablet 1   Glucagon (GVOKE HYPOPEN 2-PACK) 1 MG/0.2ML SOAJ Inject 1 Act into the skin daily as needed. 2 mL 5   glucose blood test strip Use to check blood sugar twice daily. DX: E11.8 200 each 3   Insulin Glargine (BASAGLAR KWIKPEN) 100 UNIT/ML Inject 50 Units into the skin at bedtime. Via [employer] patient assistance 30 mL 2   insulin lispro (HUMALOG) 100 UNIT/ML injection Inject 14-20 Units into the skin 3 (three)  times daily before meals. [place] patient assistance     Insulin Pen Needle (PEN NEEDLES) 32G X 4 MM MISC Inject 1 pen as directed 4 (four) times daily. Use to inject levemir or novolog as directed. 400 each 3   latanoprost (XALATAN) 0.005 % ophthalmic solution Place 1 drop into both eyes at bedtime.      magnesium oxide (MAG-OX) 400 MG tablet TAKE ONE TABLET BY MOUTH EVERYDAY AT BEDTIME 90 tablet 1   nebivolol (BYSTOLIC) 5 MG tablet Take 1 tablet (5 mg total) by mouth daily. 90 tablet 1   omeprazole (PRILOSEC) 40 MG capsule TAKE ONE CAPSULE BY MOUTH EVERY MORNING and TAKE ONE CAPSULE BY MOUTH EVERY EVENING 180 capsule 1   potassium chloride SA (KLOR-CON) 20 MEQ tablet TAKE ONE TABLET BY MOUTH EVERY MORNING 90 tablet 1   pravastatin (PRAVACHOL) 40 MG tablet TAKE ONE TABLET BY MOUTH EVERYDAY AT BEDTIME 90 tablet 1   rivaroxaban (XARELTO) 20 MG TABS tablet Take 1 tablet (20 mg total) by mouth daily. 90 tablet 1   Semaglutide, 1 MG/DOSE, (OZEMPIC, 1 MG/DOSE,) 4 MG/3ML SOPN Inject 1 mg into the skin once a week. Via [employer] patient assistance 9 mL 1   thiamine 100 MG tablet Take 1 tablet (100 mg total) by mouth every other day. 45 tablet 1   losartan-hydrochlorothiazide (HYZAAR) 100-25 MG tablet Take 1 tablet by mouth daily. 30 tablet 1   No facility-administered medications prior to visit.    ROS Review of  Systems  Constitutional:  Negative for diaphoresis, fatigue and unexpected weight change.  HENT: Negative.    Eyes: Negative.   Respiratory:  Negative for cough, chest tightness, shortness of breath and wheezing.   Cardiovascular:  Negative for chest pain, palpitations and leg swelling.  Gastrointestinal:  Negative for abdominal pain, diarrhea, nausea and vomiting.  Endocrine: Negative.  Negative for polyuria.  Genitourinary:  Negative for difficulty urinating, dysuria, hematuria and urgency.  Musculoskeletal: Negative.   Skin: Negative.  Negative for color change and rash.   Neurological:  Positive for light-headedness. Negative for dizziness, syncope and weakness.  Hematological:  Negative for adenopathy. Does not bruise/bleed easily.  Psychiatric/Behavioral: Negative.     Objective:  BP 128/76 (BP Location: Right Arm, Patient Position: Sitting, Cuff Size: Normal)   Pulse 98   Temp 98.1 F (36.7 C) (Oral)   Resp 16   Ht _0  (1.626 m)   Wt 166 lb 6.4 oz (75.5 kg)   LMP  (LMP Unknown)   SpO2 99%   BMI 28.56 kg/m   BP Readings from Last 3 Encounters:  01/21/21 128/76  01/14/21 (!) 162/78  12/30/20 138/88    Wt Readings from Last 3 Encounters:  01/21/21 166 lb 6.4 oz (75.5 kg)  01/14/21 169 lb (76.7 kg)  12/30/20 168 lb (76.2 kg)    Physical Exam Vitals reviewed.  HENT:     Nose: Nose normal.     Mouth/Throat:     Mouth: Mucous membranes are moist.  Eyes:     General: No scleral icterus.    Conjunctiva/sclera: Conjunctivae normal.  Cardiovascular:     Rate and Rhythm: Tachycardia present. Rhythm irregularly irregular.     Heart sounds: No murmur heard.   No gallop.     Comments: EKG- A fib with RVR, 115 bpm Pulmonary:     Effort: Pulmonary effort is normal.     Breath sounds: No stridor. No wheezing, rhonchi or rales.  Abdominal:     General: Abdomen is flat. Bowel sounds are normal. There is no distension.     Palpations: Abdomen is soft.     Tenderness: There is no abdominal tenderness.  Musculoskeletal:     Cervical back: Neck supple.     Right lower leg: No edema.     Left lower leg: No edema.  Skin:    General: Skin is warm.     Findings: No lesion or rash.  Neurological:     General: No focal deficit present.     Mental Status: She is alert.  Psychiatric:        Mood and Affect: Mood normal.        Behavior: Behavior normal.    Lab Results  Component Value Date   WBC 8.2 12/30/2020   HGB 14.9 12/30/2020   HCT 45.1 12/30/2020   PLT 217.0 12/30/2020   GLUCOSE 58 (L) 12/30/2020   CHOL 171 12/30/2020   TRIG  130.0 12/30/2020   HDL 55.70 12/30/2020   LDLDIRECT 160.2 03/01/2010   LDLCALC 89 12/30/2020   ALT 13 12/30/2020   AST 15 12/30/2020   NA 141 12/30/2020   K 4.0 12/30/2020   CL 104 12/30/2020   CREATININE 0.76 12/30/2020   BUN 11 12/30/2020   CO2 29 12/30/2020   TSH 0.54 12/30/2020   INR 1.3 (H) 03/08/2020   HGBA1C 6.8 (H) 12/30/2020   MICROALBUR 5.3 (H) 12/30/2020    VAS [place] ABI WITH/WO TBI  Result Date: 10/25/2020  LOWER EXTREMITY  DOPPLER STUDY Patient Name:  Theresa Harrington  Date of Exam:   10/25/2020 Medical Rec #: [id number]   Accession #:    [id number] Date of Birth: [birth year]  Patient Gender: F Patient Age:   64Y Exam Location:  [facility] Procedure:      VAS [place] ABI WITH/WO TBI Referring Phys: --------------------------------------------------------------------------------  Indications: Ulceration, and peripheral artery disease. High Risk Factors: Hypertension, hyperlipidemia. Other Factors: Right foot ulceration.  Vascular Interventions: Hx occluded left Fem Pop bypass graft.                         Left ATA endarterectomy. Left pop thrombectomy. Left                         CFA, EIA angioplasty.                          Right EIA 50-99% stenosis. Performing Technologist: Ronal Fear RVS, RCS  Examination Guidelines: A complete evaluation includes at minimum, Doppler waveform signals and systolic blood pressure reading at the level of bilateral brachial, anterior tibial, and posterior tibial arteries, when vessel segments are accessible. Bilateral testing is considered an integral part of a complete examination. Photoelectric Plethysmograph (PPG) waveforms and toe systolic pressure readings are included as required and additional duplex testing as needed. Limited examinations for reoccurring indications may be performed as noted.  ABI Findings: +---------+------------------+-----+----------+--------+ Right    Rt Pressure (mmHg)IndexWaveform  Comment   +---------+------------------+-----+----------+--------+ Brachial 187                                       +---------+------------------+-----+----------+--------+ PTA      120               0.63 monophasic         +---------+------------------+-----+----------+--------+ DP       91                0.48 monophasic         +---------+------------------+-----+----------+--------+ Great Toe38                0.20                    +---------+------------------+-----+----------+--------+ +---------+------------------+-----+----------+-------+ Left     Lt Pressure (mmHg)IndexWaveform  Comment +---------+------------------+-----+----------+-------+ Brachial 190                                      +---------+------------------+-----+----------+-------+ PTA      139               0.73 biphasic          +---------+------------------+-----+----------+-------+ DP       111               0.58 monophasic        +---------+------------------+-----+----------+-------+ Great Toe68                0.36                   +---------+------------------+-----+----------+-------+ +-------+-----------+-----------+------------+------------+ ABI/TBIToday's ABIToday's TBIPrevious ABIPrevious TBI +-------+-----------+-----------+------------+------------+ Right  0.63       0.20       0.75  0.30         +-------+-----------+-----------+------------+------------+ Left   0.73       0.36       0.76        0.46         +-------+-----------+-----------+------------+------------+  Bilateral ABIs appear essentially unchanged compared to prior study on 03/01/2020.  Summary: Right: Resting right ankle-brachial index indicates moderate right lower extremity arterial disease. The right toe-brachial index is abnormal. Left: Resting left ankle-brachial index indicates moderate left lower extremity arterial disease. The left toe-brachial index is abnormal.  *See table(s) above for  measurements and observations.  Electronically signed by Harold Barban MD on 10/25/2020 at 5:32:43 PM.    Final    VAS US CAROTID  Result Date: 10/25/2020 Carotid Arterial Duplex Study Patient Name:  Theresa Harrington  Date of Exam:   10/25/2020 Medical Rec #: [id number]   Accession #:    [id number] Date of Birth: Feb 02, 1956  Patient Gender: F Patient Age:   64Y Exam Location:  [facility] Procedure:      VAS US CAROTID Referring Phys: [id number] Serafina Mitchell --------------------------------------------------------------------------------  Indications:   Carotid artery disease. Risk Factors:  Hypertension, Diabetes. Other Factors: Lower extremity revascularizations. Performing Technologist: Ronal Fear RVS, RCS  Examination Guidelines: A complete evaluation includes B-mode imaging, spectral Doppler, color Doppler, and power Doppler as needed of all accessible portions of each vessel. Bilateral testing is considered an integral part of a complete examination. Limited examinations for reoccurring indications may be performed as noted.  Right Carotid Findings: +----------+--------+--------+--------+------------------+--------+           PSV cm/sEDV cm/sStenosisPlaque DescriptionComments +----------+--------+--------+--------+------------------+--------+ CCA Prox  84      16                                         +----------+--------+--------+--------+------------------+--------+ CCA Mid   53      18                                         +----------+--------+--------+--------+------------------+--------+ CCA Distal48      14              heterogenous               +----------+--------+--------+--------+------------------+--------+ ICA Prox  77      24      1-39%   heterogenous               +----------+--------+--------+--------+------------------+--------+ ICA Mid   72      23                                          +----------+--------+--------+--------+------------------+--------+ ICA Distal91      31                                         +----------+--------+--------+--------+------------------+--------+ ECA       329             >50%    heterogenous               +----------+--------+--------+--------+------------------+--------+ +----------+--------+-------+----------------+-------------------+  PSV cm/sEDV cmsDescribe        Arm Pressure (mmHG) +----------+--------+-------+----------------+-------------------+ Subclavian162            Multiphasic, WNL                    +----------+--------+-------+----------------+-------------------+ +---------+--------+--+--------+--+---------+ VertebralPSV cm/s85EDV cm/s20Antegrade +---------+--------+--+--------+--+---------+  Left Carotid Findings: +----------+--------+--------+--------+------------------+--------+           PSV cm/sEDV cm/sStenosisPlaque DescriptionComments +----------+--------+--------+--------+------------------+--------+ CCA Prox  88      18                                         +----------+--------+--------+--------+------------------+--------+ CCA Mid   67      16              heterogenous               +----------+--------+--------+--------+------------------+--------+ CCA Distal58      15              calcific                   +----------+--------+--------+--------+------------------+--------+ ICA Prox  67      15      1-39%   calcific                   +----------+--------+--------+--------+------------------+--------+ ICA Mid   87      28                                         +----------+--------+--------+--------+------------------+--------+ ICA Distal107     32                                         +----------+--------+--------+--------+------------------+--------+ ECA       163     14                                          +----------+--------+--------+--------+------------------+--------+ +----------+--------+--------+----------------+-------------------+           PSV cm/sEDV cm/sDescribe        Arm Pressure (mmHG) +----------+--------+--------+----------------+-------------------+ Subclavian101             Multiphasic, WNL                    +----------+--------+--------+----------------+-------------------+ +---------+--------+--+--------+-+---------+ VertebralPSV cm/s62EDV cm/s9Antegrade +---------+--------+--+--------+-+---------+   Summary: Right Carotid: Velocities in the right ICA are consistent with a 1-39% stenosis. Left Carotid: Velocities in the left ICA are consistent with a 1-39% stenosis. Vertebrals:  Bilateral vertebral arteries demonstrate antegrade flow. Subclavians: Normal flow hemodynamics were seen in bilateral subclavian              arteries. *See table(s) above for measurements and observations.  Electronically signed by Harold Barban MD on 10/25/2020 at 5:28:41 PM.    Final     Assessment & Plan:   Naly was seen today for blood pressure check.  Diagnoses and all orders for this visit:  Atrial fibrillation with RVR ([place])- I am concerned that the variations in her heart rate are contributing to the variations in her blood pressure.  I recommended that  she add diltiazem to nebivolol. -     diltiazem (CARDIZEM CD) 120 MG 24 hr capsule; Take 1 capsule (120 mg total) by mouth daily. -     Ambulatory referral to Cardiology -     EKG 12-Lead  Essential hypertension, benign- Her blood pressure is overcontrolled and she is symptomatic.  I recommended that she stop taking the ARB and thiazide diuretic. -     diltiazem (CARDIZEM CD) 120 MG 24 hr capsule; Take 1 capsule (120 mg total) by mouth daily.  I have discontinued Jonah Jehle's losartan-hydrochlorothiazide. I am also having her start on diltiazem. Additionally, I am having her maintain her Pen Needles, latanoprost, OneTouch Verio  IQ System, dorzolamide-timolol, thiamine, insulin lispro, glucose blood, cetirizine, potassium chloride SA, pravastatin, ezetimibe, omeprazole, magnesium oxide, Synjardy XR, Basaglar KwikPen, Ozempic (1 MG/DOSE), [place] HypoPen 2-Pack, nebivolol, and rivaroxaban.  Meds ordered this encounter  Medications   diltiazem (CARDIZEM CD) 120 MG 24 hr capsule    Sig: Take 1 capsule (120 mg total) by mouth daily.    Dispense:  90 capsule    Refill:  0      Follow-up: Return in about 6 weeks (around 03/04/2021).  Scarlette Calico, MD

## 2021-01-21 NOTE — Patient Instructions (Signed)
Atrial Fibrillation  Atrial fibrillation is a type of irregular or rapid heartbeat (arrhythmia). In atrial fibrillation, the top part of the heart (atria) beats in an irregular pattern. This makes the heart unable to pump bloodnormally and effectively. The goal of treatment is to prevent blood clots from forming, control your heart rate, or restore your heartbeat to a normal rhythm. If this condition is not treated, it can cause serious problems, such as a weakened heart muscle (cardiomyopathy) or a stroke. What are the causes? This condition is often caused by medical conditions that damage the heart's electrical system. These include: High blood pressure (hypertension). This is the most common cause. Certain heart problems or conditions, such as heart failure, coronary artery disease, heart valve problems, or heart surgery. Diabetes. Overactive thyroid (hyperthyroidism). Obesity. Chronic kidney disease. In some cases, the cause of this condition is not known. What increases the risk? This condition is more likely to develop in: Older people. People who smoke. Athletes who do endurance exercise. People who have a family history of atrial fibrillation. Men. People who use drugs. People who drink a lot of alcohol. People who have lung conditions, such as emphysema, pneumonia, or COPD. People who have obstructive sleep apnea. What are the signs or symptoms? Symptoms of this condition include: A feeling that your heart is racing or beating irregularly. Discomfort or pain in your chest. Shortness of breath. Sudden light-headedness or weakness. Tiring easily during exercise or activity. Fatigue. Syncope (fainting). Sweating. In some cases, there are no symptoms. How is this diagnosed? Your health care provider may detect atrial fibrillation when taking your pulse. If detected, this condition may be diagnosed with: An electrocardiogram (ECG) to check electrical signals of the  heart. An ambulatory cardiac monitor to record your heart's activity for a few days. A transthoracic echocardiogram (TTE) to create pictures of your heart. A transesophageal echocardiogram (TEE) to create even closer pictures of your heart. A stress test to check your blood supply while you exercise. Imaging tests, such as a CT scan or chest X-ray. Blood tests. How is this treated? Treatment depends on underlying conditions and how you feel when you experience atrial fibrillation. This condition may be treated with: Medicines to prevent blood clots or to treat heart rate or heart rhythm problems. Electrical cardioversion to reset the heart's rhythm. A pacemaker to correct abnormal heart rhythm. Ablation to remove the heart tissue that sends abnormal signals. Left atrial appendage closure to seal the area where blood clots can form. In some cases, underlying conditions will be treated. Follow these instructions at home: Medicines Take over-the counter and prescription medicines only as told by your health care provider. Do not take any new medicines without talking to your health care provider. If you are taking blood thinners: Talk with your health care provider before you take any medicines that contain aspirin or NSAIDs, such as ibuprofen. These medicines increase your risk for dangerous bleeding. Take your medicine exactly as told, at the same time every day. Avoid activities that could cause injury or bruising, and follow instructions about how to prevent falls. Wear a medical alert bracelet or carry a card that lists what medicines you take. Lifestyle     Do not use any products that contain nicotine or tobacco, such as cigarettes, e-cigarettes, and chewing tobacco. If you need help quitting, ask your health care provider. Eat heart-healthy foods. Talk with a dietitian to make an eating plan that is right for you. Exercise regularly as told by   your health care provider. Do not  drink alcohol. Lose weight if you are overweight. Do not use drugs, including cannabis. General instructions If you have obstructive sleep apnea, manage your condition as told by your health care provider. Do not use diet pills unless your health care provider approves. Diet pills can make heart problems worse. Keep all follow-up visits as told by your health care provider. This is important. Contact a health care provider if you: Notice a change in the rate, rhythm, or strength of your heartbeat. Are taking a blood thinner and you notice more bruising. Tire more easily when you exercise or do heavy work. Have a sudden change in weight. Get help right away if you have:  Chest pain, abdominal pain, sweating, or weakness. Trouble breathing. Side effects of blood thinners, such as blood in your vomit, stool, or urine, or bleeding that cannot stop. Any symptoms of a stroke. "BE FAST" is an easy way to remember the main warning signs of a stroke: B - Balance. Signs are dizziness, sudden trouble walking, or loss of balance. E - Eyes. Signs are trouble seeing or a sudden change in vision. F - Face. Signs are sudden weakness or numbness of the face, or the face or eyelid drooping on one side. A - Arms. Signs are weakness or numbness in an arm. This happens suddenly and usually on one side of the body. S - Speech. Signs are sudden trouble speaking, slurred speech, or trouble understanding what people say. T - Time. Time to call emergency services. Write down what time symptoms started. Other signs of a stroke, such as: A sudden, severe headache with no known cause. Nausea or vomiting. Seizure. These symptoms may represent a serious problem that is an emergency. Do not wait to see if the symptoms will go away. Get medical help right away. Call your local emergency services (911 in the [place] Do not drive yourself to the hospital. Summary Atrial fibrillation is a type of irregular or rapid  heartbeat (arrhythmia). Symptoms include a feeling that your heart is beating fast or irregularly. You may be given medicines to prevent blood clots or to treat heart rate or heart rhythm problems. Get help right away if you have signs or symptoms of a stroke. Get help right away if you cannot catch your breath or have chest pain or pressure. This information is not intended to replace advice given to you by your health care provider. Make sure you discuss any questions you have with your healthcare provider. Document Revised: 10/09/2018 Document Reviewed: 10/09/2018 Elsevier Patient Education  2022 [employer]  

## 2021-01-27 ENCOUNTER — Telehealth: Payer: Self-pay | Admitting: Internal Medicine

## 2021-01-27 ENCOUNTER — Telehealth: Payer: Self-pay | Admitting: Pharmacist

## 2021-01-27 ENCOUNTER — Ambulatory Visit: Payer: PPO | Admitting: Nurse Practitioner

## 2021-01-27 NOTE — Telephone Encounter (Signed)
Spoke with the patient and she stated that she was unaware her appt today. She stated that when she took her blood pressure around 12:30 pm it was 120/65 and this was the highest it has been all day. She denies having any chest pain, sob, headache.

## 2021-01-27 NOTE — Progress Notes (Signed)
Chronic Care Management Pharmacy Assistant   Name: Anyelin Mogle  MRN: [id number] DOB: 1955/08/05   Reason for Encounter: Medication Review    Recent office visits:  01/21/21 [name], MD (PCP) Essential hypertension, med changes: discontinued losartan-hydrochlorothiazide and start on diltiazem  01/14/21 Ailene Ards, NP-Internal Medicine (Essential hypertension) med changes:  take 5 mg of Bystolic daily and increase her losartan-hydrochlorothiazide to 100-25 mg a mouth daily  12/30/20 [name], MD (PCP) Type 2 diabetes mellitus with other circulatory complication, with long-term current use of insulin ([place]) med changes: discontinued mupirocin ointment, changed nebivolol 5 mg dailyand started Gvoke HypoPen 2-pack Recent consult visits:  01/13/21 Estella Husk, DPM-Podiatry [place], [place] and re-check feet. 11/25/20 Estella Husk, DPM-Podiat[place], Follow-up on right great toe. 11/11/20 Tilles, Osvaldo Shipper, DPM-Podiatry [place], Follow up to blood blister on right great toe  Hospital visits:  None in previous 6 months  Medications: Outpatient Encounter Medications as of 01/27/2021  Medication Sig   Blood Glucose Monitoring Suppl (ONETOUCH VERIO IQ SYSTEM) w/Device KIT USE TO CHECK SUGAR DAILY   cetirizine (ZYRTEC) 10 MG tablet Take 10 mg by mouth daily as needed for allergies.   diltiazem (CARDIZEM CD) 120 MG 24 hr capsule Take 1 capsule (120 mg total) by mouth daily.   dorzolamide-timolol (COSOPT) 22.3-6.8 MG/ML ophthalmic solution Place 1 drop into the right eye 2 (two) times daily.    Empagliflozin-metFORMIN HCl ER (SYNJARDY XR) 12.08-998 MG TB24 Take 1 tablet by mouth daily. Via [employer] patient assistance   ezetimibe (ZETIA) 10 MG tablet TAKE ONE TABLET BY MOUTH EVERYDAY AT BEDTIME   Glucagon (GVOKE HYPOPEN 2-PACK) 1 MG/0.2ML SOAJ Inject 1 Act into the skin daily as needed.    glucose blood test strip Use to check blood sugar twice daily. DX: E11.8   Insulin Glargine (BASAGLAR KWIKPEN) 100 UNIT/ML Inject 50 Units into the skin at bedtime. [place] patient assistance   insulin lispro (HUMALOG) 100 UNIT/ML injection Inject 14-20 Units into the skin 3 (three) times daily before meals. [place] patient assistance   Insulin Pen Needle (PEN NEEDLES) 32G X 4 MM MISC Inject 1 pen as directed 4 (four) times daily. Use to inject levemir or novolog as directed.   latanoprost (XALATAN) 0.005 % ophthalmic solution Place 1 drop into both eyes at bedtime.    magnesium oxide (MAG-OX) 400 MG tablet TAKE ONE TABLET BY MOUTH EVERYDAY AT BEDTIME   nebivolol (BYSTOLIC) 5 MG tablet Take 1 tablet (5 mg total) by mouth daily.   omeprazole (PRILOSEC) 40 MG capsule TAKE ONE CAPSULE BY MOUTH EVERY MORNING and TAKE ONE CAPSULE BY MOUTH EVERY EVENING   potassium chloride SA (KLOR-CON) 20 MEQ tablet TAKE ONE TABLET BY MOUTH EVERY MORNING   pravastatin (PRAVACHOL) 40 MG tablet TAKE ONE TABLET BY MOUTH EVERYDAY AT BEDTIME   rivaroxaban (XARELTO) 20 MG TABS tablet Take 1 tablet (20 mg total) by mouth daily.   Semaglutide, 1 MG/DOSE, (OZEMPIC, 1 MG/DOSE,) 4 MG/3ML SOPN Inject 1 mg into the skin once a week. Via [employer] patient assistance   thiamine 100 MG tablet Take 1 tablet (100 mg total) by mouth every other day.   No facility-administered encounter medications on file as of 01/27/2021.   BP Readings from Last 3 Encounters:  01/21/21 128/76  01/14/21 (!) 162/78  12/30/20 138/88    Lab Results  Component Value Date  HGBA1C 6.8 (H) 12/30/2020      Last adherence delivery date:11/08/20      Patient is due for next adherence delivery on: 02/07/21  Spoke with patient on 01/27/21 and reviewed medications. Patient denied the need for any medications at this time.  This delivery to include: Vials  90 Days    Patient declined the following medications this month: Losartan  18m Tab Take one tab by mouth every day at bedtime Pravastatin 450mTake one tab every day at bedtime Ezetimibe 1038m Take one tab every day at bedtime Omeprazole 39m17mke one capsule every morning and take one capsule every evening Mag Oxide 400mg97me one tab every day at bedtime   AAnnual wellness visit in last year? Yes, 12/30/20 Most Recent BP reading:138/88  If Diabetic: Most recent A1C reading:6.8 12/30/20 Last eye exam / retinopathy screening:10/30/20 Last diabetic foot exam: 12/30/20  TracyEthelene Halical Pharmacist Assistant [phone] spent:22

## 2021-01-27 NOTE — Telephone Encounter (Signed)
   Patient seeking advice for BP 85/65. Patient requested appointment for today as well Theresa Harrington @4pm   Please call patient

## 2021-01-28 NOTE — Telephone Encounter (Signed)
Patient's has been taken off of losartan/hctz due to low BP. Diltiazem 120 mg has been added.  Patient has declined last several deliveries from [facility]. She has had a few med changes recently that were picked up from [place]. Scheduled CCM f/u visit to discuss changes.  Surescripts summary: -Nebivolol 5 mg - 01/13/21 x 90 ds @ [facility] -Diltiazem 120 mg - 01/21/21 x 90 ds @ [facility] -Pravastatin 40 mg - 11/09/20 x 90 ds @ [facility]

## 2021-01-31 ENCOUNTER — Telehealth: Payer: PPO
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
## 2021-02-01 ENCOUNTER — Other Ambulatory Visit: Payer: Self-pay

## 2021-02-01 ENCOUNTER — Ambulatory Visit (HOSPITAL_BASED_OUTPATIENT_CLINIC_OR_DEPARTMENT_OTHER): Payer: PPO | Admitting: Cardiology

## 2021-02-01 VITALS — BP 150/66 | HR 56 | Ht 64.0 in | Wt 174.1 lb

## 2021-02-01 DIAGNOSIS — I342 Nonrheumatic mitral (valve) stenosis: Secondary | ICD-10-CM

## 2021-02-01 DIAGNOSIS — Z7901 Long term (current) use of anticoagulants: Secondary | ICD-10-CM

## 2021-02-01 DIAGNOSIS — I480 Paroxysmal atrial fibrillation: Secondary | ICD-10-CM

## 2021-02-01 DIAGNOSIS — E119 Type 2 diabetes mellitus without complications: Secondary | ICD-10-CM | POA: Diagnosis not present

## 2021-02-01 DIAGNOSIS — I10 Essential (primary) hypertension: Secondary | ICD-10-CM | POA: Diagnosis not present

## 2021-02-01 NOTE — Assessment & Plan Note (Signed)
Echocardiogram reviewed, severe left atrial enlargement.  She is having paroxysmal atrial fibrillation.  Appreciate Dr. Scarlette Calico starting her on Cardizem 120 CD once a day.  She is also on Bystolic 5 mg daily.  I would like for her to sit down and talk with electrophysiology regarding potential ablation given her paroxysmal atrial fibrillation.  She does have severe left atrial enlargement and what looks like mild mitral stenosis.  At this point, she is not in need of valve surgery and if we can ward off future episodes of atrial fibrillation this may be helpful for her overall.  Most of the time, she does not feel this atrial fibrillation and my concern is that she may be in rapid ventricular response which could lead to cardiac deterioration over time without continued excellent rhythm control.  Continue with Xarelto for anticoagulation

## 2021-02-01 NOTE — Patient Instructions (Signed)
Medication Instructions:  The current medical regimen is effective;  continue present plan and medications.  *If you need a refill on your cardiac medications before your next appointment, please call your pharmacy*  You have been referred to Dr Reggy Eye - [address], [place], [place] to discuss possible Atrial Fibrillation Ablation.  Follow-Up: At [facility], you and your health needs are our priority.  As part of our continuing mission to provide you with exceptional heart care, we have created designated Provider Care Teams.  These Care Teams include your primary Cardiologist (physician) and Advanced Practice Providers (APPs -  Physician Assistants and Nurse Practitioners) who all work together to provide you with the care you need, when you need it.  We recommend signing up for the patient portal called "MyChart".  Sign up information is provided on this After Visit Summary.  MyChart is used to connect with patients for Virtual Visits (Telemedicine).  Patients are able to view lab/test results, encounter notes, upcoming appointments, etc.  Non-urgent messages can be sent to your provider as well.   To learn more about what you can do with MyChart, go to [email or web address].    Your next appointment:   6 month(s)  The format for your next appointment:   In Person  Provider:   [name], MD   Thank you for choosing [facility]!!

## 2021-02-01 NOTE — Assessment & Plan Note (Signed)
Very mild on ECHO, continue to monitor. She is not in range for replacement.

## 2021-02-01 NOTE — Progress Notes (Signed)
Cardiology Office Note   Date:  02/01/2021   ID:  Theresa Harrington, DOB 10-13-1955, MRN [id number]  PCP:  [name], MD  Cardiologist:  [name], MD  Electrophysiologist:  None   Evaluation Performed:  Follow-Up Visit  History of Present Illness:    Theresa Harrington is a 65 y.o. female here for the follow up of atrial fibrillation with RVR per Dr. Scarlette Calico.  Severe mitral annular calcification of mitral valve with thickening of the posterior mitral valve leaflet with edges immobile.  There is very mild stenosis.  3 mmHg gradient.  No mitral regurgitation.  At her last appointment, she was doing well.  Today: Overall she appears well.  Previously, her blood pressure was running high, about 567O systolic. After a medication change, it dropped to the 70s/60s. When she was last seen by Dr. [name] (01/21/2021) she was noted to be back in atrial fibrillation. She was not able to feel when she returned to Afib. When she was in Afib her heart rate would fluctuate such as from 120 bpm, to 70, and then to 99 bpm.  At home her blood pressure has generally been stable. This morning it was 128/60. She has not been taking losartan because her blood pressure has been low. However, she did take it a few times recently because her blood pressure seemed to be gradually increasing again.  She denies any palpitations, chest pain, or shortness of breath. No lightheadedness, headaches, syncope, orthopnea, or PND. Also has no lower extremity edema or exertional symptoms.   Past Medical History:  Diagnosis Date   Allergy    Anxiety    Pt. denies   Arthritis    right index figer   Asymptomatic cholelithiasis    Atherosclerosis of aorta (HCC)    Cataract    Clotting disorder ([place])    Gallstones 06/2013   GERD (gastroesophageal reflux disease)    History of kidney stones    lithotrispy   Hyperlipidemia    Hypertension    Hypothyroidism    no meds   Iron deficiency anemia    PAD (peripheral  artery disease) (HCC)    PAF (paroxysmal atrial fibrillation) (HCC)    Peripheral arterial occlusive disease (HCC)    lower extremities   Pneumonia    PONV (postoperative nausea and vomiting)    Right ureteral stone    Type 2 diabetes mellitus ([place])    Type   Vitamin B 12 deficiency    Wears glasses    Past Surgical History:  Procedure Laterality Date   ABDOMINAL AORTOGRAM W/LOWER EXTREMITY N/A 03/09/2020   Procedure: ABDOMINAL AORTOGRAM W/LOWER EXTREMITY;  Surgeon: Serafina Mitchell, MD;  Location: [facility];  Service: Cardiovascular;  Laterality: N/A;   AMPUTATION Left 07/16/2014   Procedure: LEFT SECOND TOE AMPUTATION;  Surgeon: Serafina Mitchell, MD;  Location: [place];  Service: Vascular;  Laterality: Left;  With Nerve block   AUGMENTATION MAMMAPLASTY     BELPHAROPTOSIS REPAIR     eyelid lift   BIOPSY  10/14/2018   Procedure: BIOPSY;  Surgeon: Rush Landmark Telford Nab., MD;  Location: [place];  Service: Gastroenterology;;   BIOPSY  08/04/2019   Procedure: BIOPSY;  Surgeon: Irving Copas., MD;  Location: [place];  Service: Gastroenterology;;   CARDIOVERSION N/A 09/19/2019   Procedure: CARDIOVERSION;  Surgeon: Buford Dresser, MD;  Location: [facility];  Service: Cardiovascular;  Laterality: N/A;   COLONOSCOPY     COMBINED AUGMENTATION MAMMAPLASTY AND ABDOMINOPLASTY  2009   W/  BILATERAL  THIGH LIFT   CYSTO/  RIGHT URETERAL STENT PLACEMENT  12-30-2010   CYSTOSCOPY WITH RETROGRADE PYELOGRAM, URETEROSCOPY AND STENT PLACEMENT Right 10/07/2013   Procedure: CYSTOSCOPY WITH RETROGRADE PYELOGRAM, right URETEROSCOPY AND STENT PLACEMENT, stone extraction;  Surgeon: Arvil Persons, MD;  Location: [facility];  Service: Urology;  Laterality: Right;   CYSTOSCOPY WITH STENT PLACEMENT Right 06/04/2013   Procedure: CYSTOSCOPY WITH STENT PLACEMENT;  Surgeon: Franchot Gallo, MD;  Location: [facility];  Service: Urology;  Laterality: Right;   DILATATION &  CURETTAGE/HYSTEROSCOPY WITH MYOSURE N/A 04/27/2015   Procedure: DILATATION & CURETTAGE/HYSTEROSCOPY WITH MYOSURE;  Surgeon: Terrance Mass, MD;  Location: [facility];  Service: Gynecology;  Laterality: N/A;   ENDARTERECTOMY FEMORAL Left 06/08/2014   Procedure: Left Leg Common Femoral and External Iliac  Endartarectomy with patch Angioplasty;  Surgeon: Rosetta Posner, MD;  Location: [facility];  Service: Vascular;  Laterality: Left;   ESOPHAGOGASTRODUODENOSCOPY N/A 10/14/2018   Procedure: ESOPHAGOGASTRODUODENOSCOPY (EGD);  Surgeon: Irving Copas., MD;  Location: [place];  Service: Gastroenterology;  Laterality: N/A;   ESOPHAGOGASTRODUODENOSCOPY (EGD) WITH PROPOFOL N/A 11/06/2018   Procedure: ESOPHAGOGASTRODUODENOSCOPY (EGD) WITH PROPOFOL;  Surgeon: Rush Landmark Telford Nab., MD;  Location: [facility];  Service: Gastroenterology;  Laterality: N/A;  RFA   ESOPHAGOGASTRODUODENOSCOPY (EGD) WITH PROPOFOL N/A 02/12/2019   Procedure: ESOPHAGOGASTRODUODENOSCOPY (EGD) WITH PROPOFOL;  Surgeon: Rush Landmark Telford Nab., MD;  Location: [facility];  Service: Gastroenterology;  Laterality: N/A;   ESOPHAGOGASTRODUODENOSCOPY (EGD) WITH PROPOFOL N/A 08/04/2019   Procedure: ESOPHAGOGASTRODUODENOSCOPY (EGD) WITH PROPOFOL;  Surgeon: Rush Landmark Telford Nab., MD;  Location: [place];  Service: Gastroenterology;  Laterality: N/A;  WITH RFA   EXTRACORPOREAL SHOCK WAVE LITHOTRIPSY Right 08-04-2013//   06-23-2013//   01-16-2011   EYE SURGERY Bilateral    cataract   FEMORAL-POPLITEAL BYPASS GRAFT Left 06/08/2014   Procedure: Left Leg Femoral -Popliteal Bypass Graft;  Surgeon: Rosetta Posner, MD;  Location: [facility];  Service: Vascular;  Laterality: Left;   FEMORAL-POPLITEAL BYPASS GRAFT Left 06/09/2014   Procedure: Left Femoral and Popliteal Exposure; Left Femoral to Anterior Tibial Bypass Graft using Propaten 45m by 80cm Goretex Graft; Left Tibial Endarterectomy; Left Femoraland Popliteal Thrombectomy ;  Surgeon: VSerafina Mitchell  MD;  [place]  Service: Vascular;  Laterality: Left;   GI RADIOFREQUENCY ABLATION N/A 11/06/2018   Procedure: GI RADIOFREQUENCY ABLATION;  Surgeon: MIrving Copas, MD;  [facility];  Service: Gastroenterology;  Laterality: N/A;   GI RADIOFREQUENCY ABLATION N/A 02/12/2019   Procedure: GI RADIOFREQUENCY ABLATION;  Surgeon: MRush LandmarkGTelford Nab, MD;  [facility];  Service: Gastroenterology;  Laterality: N/A;   GI RADIOFREQUENCY ABLATION N/A 08/04/2019   Procedure: GI RADIOFREQUENCY ABLATION;  Surgeon: MRush LandmarkGTelford Nab, MD;  [place]  Service: Gastroenterology;  Laterality: N/A;   HOLMIUM LASER APPLICATION Right 66/[id number]/7371  Procedure: HOLMIUM LASER APPLICATION;  Surgeon: MArvil Persons MD;  [facility]  Service: Urology;  Laterality: Right;   KIDNEY STONE SURGERY  April 2015   1-2 stones   LAPAROSCOPIC GASTRIC BANDING  05-29-2005   LITHOTRIPSY  2-3 times   LOWER EXTREMITY ANGIOGRAM N/A 06/04/2014   Procedure: LOWER EXTREMITY ANGIOGRAM;  Surgeon: VSerafina Mitchell MD;  [facility];  Service: Cardiovascular;  Laterality: N/A;   ORIF FIFTH METACARPAL FX  RIGHT HAND  04-21-2002   REVISION AND RE-SITING LAP-BAND PORT  04-08-2010   W/  UPPER EGD   RIGHT KNEE  PATELLECTOMY W/ REPAIR OF EXTENSOR MECHANISM  04-14-2002   TEE WITHOUT CARDIOVERSION N/A 12/24/2019   Procedure: TRANSESOPHAGEAL ECHOCARDIOGRAM (TEE);  Surgeon: Sueanne Margarita, MD;  Location: [facility] ENDOSCOPY;  Service: Cardiovascular;  Laterality: N/A;   Toenail removed Left Jan. 21, 2016   2nd toenail-  Dr. Barkley Bruns     Current Meds  Medication Sig   Blood Glucose Monitoring Suppl (ONETOUCH VERIO IQ SYSTEM) w/Device KIT USE TO CHECK SUGAR DAILY   cetirizine (ZYRTEC) 10 MG tablet Take 10 mg by mouth daily as needed for allergies.   diltiazem (CARDIZEM CD) 120 MG 24 hr capsule Take 1 capsule (120 mg total) by mouth daily.   dorzolamide-timolol (COSOPT)  22.3-6.8 MG/ML ophthalmic solution Place 1 drop into the right eye 2 (two) times daily.    Empagliflozin-metFORMIN HCl ER (SYNJARDY XR) 12.08-998 MG TB24 Take 1 tablet by mouth daily. Via [employer] patient assistance   ezetimibe (ZETIA) 10 MG tablet TAKE ONE TABLET BY MOUTH EVERYDAY AT BEDTIME   Glucagon (GVOKE HYPOPEN 2-PACK) 1 MG/0.2ML SOAJ Inject 1 Act into the skin daily as needed.   glucose blood test strip Use to check blood sugar twice daily. DX: E11.8   Insulin Glargine (BASAGLAR KWIKPEN) 100 UNIT/ML Inject 50 Units into the skin at bedtime. [place] patient assistance   insulin lispro (HUMALOG) 100 UNIT/ML injection Inject 14-20 Units into the skin 3 (three) times daily before meals. [place] patient assistance   Insulin Pen Needle (PEN NEEDLES) 32G X 4 MM MISC Inject 1 pen as directed 4 (four) times daily. Use to inject levemir or novolog as directed.   latanoprost (XALATAN) 0.005 % ophthalmic solution Place 1 drop into both eyes at bedtime.    magnesium oxide (MAG-OX) 400 MG tablet TAKE ONE TABLET BY MOUTH EVERYDAY AT BEDTIME   nebivolol (BYSTOLIC) 5 MG tablet Take 1 tablet (5 mg total) by mouth daily.   omeprazole (PRILOSEC) 40 MG capsule TAKE ONE CAPSULE BY MOUTH EVERY MORNING and TAKE ONE CAPSULE BY MOUTH EVERY EVENING   potassium chloride SA (KLOR-CON) 20 MEQ tablet TAKE ONE TABLET BY MOUTH EVERY MORNING   pravastatin (PRAVACHOL) 40 MG tablet TAKE ONE TABLET BY MOUTH EVERYDAY AT BEDTIME   rivaroxaban (XARELTO) 20 MG TABS tablet Take 1 tablet (20 mg total) by mouth daily.   Semaglutide, 1 MG/DOSE, (OZEMPIC, 1 MG/DOSE,) 4 MG/3ML SOPN Inject 1 mg into the skin once a week. Via [employer] patient assistance   thiamine 100 MG tablet Take 1 tablet (100 mg total) by mouth every other day.     Allergies:   Amlodipine, Atorvastatin, Ampicillin, Codeine, Lisinopril, Penicillins, and Januvia [sitagliptin]   Social History   Tobacco Use   Smoking status: Former    Packs/day:  1.00    Years: 28.00    Pack years: 28.00    Types: Cigarettes    Quit date: 09/05/2001    Years since quitting: 19.4   Smokeless tobacco: Never  Vaping Use   Vaping Use: Never used  Substance Use Topics   Alcohol use: No    Alcohol/week: 0.0 standard drinks   Drug use: No     Family Hx: The patient's family history includes Arthritis in an other family member; Cancer in her brother and another family member; Colon cancer in her maternal aunt; Deep vein thrombosis in her father and sister; Diabetes in her brother, brother, brother, father, and sister; Heart disease in her brother, father, and sister; Hyperlipidemia in her brother, father,  mother, and sister; Hypertension in an other family member; Kidney disease in her mother; Other in her mother; Stroke in an other family member. There is no history of Esophageal cancer, Stomach cancer, Inflammatory bowel disease, Liver disease, or Pancreatic cancer.  ROS:   Please see the history of present illness.    All other systems reviewed and are negative.   Prior CV studies:   The following studies were reviewed today:  Carotid Duplex 10/25/2020: Summary:  Right Carotid: Velocities in the right ICA are consistent with a 1-39%  stenosis.   Left Carotid: Velocities in the left ICA are consistent with a 1-39%  stenosis.   Vertebrals:  Bilateral vertebral arteries demonstrate antegrade flow.  Subclavians: Normal flow hemodynamics were seen in bilateral subclavian arteries.   ABI 10/25/2020: Summary:  Right: Resting right ankle-brachial index indicates moderate right lower  extremity arterial disease. The right toe-brachial index is abnormal.   Left: Resting left ankle-brachial index indicates moderate left lower  extremity arterial disease. The left toe-brachial index is abnormal.   Echo 10/07/2020: 1. Left ventricular ejection fraction, by estimation, is 65 to 70%. The  left ventricle has normal function. The left ventricle has no  regional  wall motion abnormalities. There is mild left ventricular hypertrophy.  Left ventricular diastolic parameters  are consistent with Grade II diastolic dysfunction (pseudonormalization).  Elevated left atrial pressure.   2. Right ventricular systolic function is normal. The right ventricular  size is normal. Tricuspid regurgitation signal is inadequate for assessing  PA pressure.   3. Left atrial size was severely dilated.   4. Right atrial size was mildly dilated.   5. The mitral valve is abnormal. Severe mitral annular calcification. No  evidence of mitral valve regurgitation. Mild to moderate mitral stenosis.  MG 45mHg, MVA 1.4 cm^2 by continuity equation   6. The aortic valve is tricuspid. Aortic valve regurgitation is not  visualized. Mild to moderate aortic valve sclerosis/calcification is  present, without any evidence of aortic stenosis.   7. The inferior vena cava is normal in size with greater than 50%  respiratory variability, suggesting right atrial pressure of 3 mmHg.   Abdominal aortogram 03/09/2020: Findings:               Aortogram: No significant renal artery stenosis was identified.  The infrarenal abdominal aorta is widely patent.  Bilateral common and external iliac arteries widely patent.               Right Lower Extremity: Diffuse calcific disease within the right common femoral artery with approximate 70% stenosis.  The profundofemoral artery is patent.  The superficial femoral artery is a flush occlusion.  There is reconstitution from geniculate collaterals of the anterior tibial artery which backfills into a small below-knee popliteal artery.  There does appear to be opacification of all 3 tibial vessels however they are very small in caliber               Left Lower Extremity: Left common femoral profundofemoral artery widely patent.  The femoral-popliteal bypass graft is occluded.  The superficial femoral artery is occluded.  There is reconstitution of the  anterior tibial artery and opacification of all 3 tibial vessels which are very small in caliber   Intervention: None, a minx was used for closure   Impression:               #1  Occluded left femoral-popliteal bypass graft and left superficial femoral artery               #  2  High-grade calcific stenosis within the right common femoral artery, greater than 70%.  There is a flush occlusion of the right superficial femoral artery with reconstitution of the anterior tibial artery with backfilling of the below-knee popliteal artery.  The tibial vessels are small in caliber.               #3  The patient will be considered for right femoral endarterectomy with femoral below-knee popliteal artery bypass graft  TEE 12/24/2019:  1. Left ventricular ejection fraction, by estimation, is 60 to 65%. The  left ventricle has normal function. The left ventricle has no regional  wall motion abnormalities.   2. Right ventricular systolic function is normal. The right ventricular  size is mildly enlarged.   3. Left atrial size was moderately dilated. No left atrial/left atrial  appendage thrombus was detected.   4. 3D imaging of mitral valve was performed. The mitral valve is  degenerative in appearance. There is severe thickening of the posterior  mitral valve leaflet(s). There is severe calcification of the posterior  mitral valve leaflet(s). Fixed posterior  leaflet of the mitral valve leaflets. Severe mitral annular calcification.  No evidence of mitral valve regurgitation. Very Mild mitral stenosis. The  mean mitral valve gradient is 2.5 mmHg.   5. The aortic valve is normal in structure. Aortic valve regurgitation is  trivial. Mild aortic valve sclerosis is present, with no evidence of  aortic valve stenosis.   6. The inferior vena cava is normal in size with greater than 50%  respiratory variability, suggesting right atrial pressure of 3 mmHg.   Conclusion(s)/Recommendation(s): Normal  biventricular function without  evidence of hemodynamically significant valvular heart disease.   Labs/Other Tests and Data Reviewed:    EKG:  EKG is personally reviewed and interpreted. 02/01/2021: Sinus bradycardia. Rate 56 bpm.  Recent Labs: 12/30/2020: ALT 13; BUN 11; Creatinine, Ser 0.76; Hemoglobin 14.9; Platelets 217.0; Potassium 4.0; Sodium 141; TSH 0.54   Recent Lipid Panel Lab Results  Component Value Date/Time   CHOL 171 12/30/2020 10:46 AM   TRIG 130.0 12/30/2020 10:46 AM   HDL 55.70 12/30/2020 10:46 AM   CHOLHDL 3 12/30/2020 10:46 AM   LDLCALC 89 12/30/2020 10:46 AM   LDLDIRECT 160.2 03/01/2010 10:01 AM    Wt Readings from Last 3 Encounters:  02/01/21 174 lb 1.6 oz (79 kg)  01/21/21 166 lb 6.4 oz (75.5 kg)  01/14/21 169 lb (76.7 kg)     Objective:    VS:  BP (!) 150/66   Pulse (!) 56   Ht '5\' 4"'  (1.626 m)   Wt 174 lb 1.6 oz (79 kg)   LMP  (LMP Unknown)   BMI 29.88 kg/m     Wt Readings from Last 3 Encounters:  02/01/21 174 lb 1.6 oz (79 kg)  01/21/21 166 lb 6.4 oz (75.5 kg)  01/14/21 169 lb (76.7 kg)     GEN: Well nourished, well developed in no acute distress HEENT: Normal NECK: No JVD; No carotid bruits LYMPHATICS: No lymphadenopathy CARDIAC: RRR, soft holosystolic murmur, rubs, gallops RESPIRATORY:  Clear to auscultation without rales, wheezing or rhonchi  ABDOMEN: Soft, non-tender, non-distended MUSCULOSKELETAL:  No edema; No deformity  SKIN: Warm and dry NEUROLOGIC:  Alert and oriented x 3 PSYCHIATRIC:  Normal affect    ASSESSMENT & PLAN:   Atrial fibrillation (HCC) Echocardiogram reviewed, severe left atrial enlargement.  She is having paroxysmal atrial fibrillation.  Appreciate Dr. Scarlette Calico starting her on Cardizem 120 CD  once a day.  She is also on Bystolic 5 mg daily.  I would like for her to sit down and talk with electrophysiology regarding potential ablation given her paroxysmal atrial fibrillation.  She does have severe left atrial  enlargement and what looks like mild mitral stenosis.  At this point, she is not in need of valve surgery and if we can ward off future episodes of atrial fibrillation this may be helpful for her overall.  Most of the time, she does not feel this atrial fibrillation and my concern is that she may be in rapid ventricular response which could lead to cardiac deterioration over time without continued excellent rhythm control.  Continue with Xarelto for anticoagulation  Diabetes mellitus with coincident hypertension ([place]) Diabetes managed with Dr. [name].  Agree with current blood pressure management as well.  Her losartan hydrochlorothiazide was discontinued secondary to hypotension.  Today her blood pressure was a little bit elevated however at home she stated it was in the 120 range, excellent.  Continue to monitor this with home blood pressure monitoring.  If it does increase, she may need further agent.  Chronic anticoagulation Continue with Xarelto.  Hemoglobin 14.9 creatinine 0.76  Mitral valve stenosis Very mild on ECHO, continue to monitor. She is not in range for replacement.    Medication Adjustments/Labs and Tests Ordered: Current medicines are reviewed at length with the patient today.  Concerns regarding medicines are outlined above.   Tests Ordered: Orders Placed This Encounter  Procedures   Ambulatory referral to Cardiac Electrophysiology   EKG 12-Lead     Medication Changes: No orders of the defined types were placed in this encounter.   Follow Up:  6 months.  I,Mathew Stumpf,acting as a Education administrator for [employer], MD.,have documented all relevant documentation on the behalf of [name], MD,as directed by  [name], MD while in the presence of [name], MD.  I, [name], MD, have reviewed all documentation for this visit. The documentation on 02/01/21 for the exam, diagnosis, procedures, and orders are all accurate and complete.   Signed, [name], MD   02/01/2021 1:18 PM    [facility]

## 2021-02-01 NOTE — Assessment & Plan Note (Signed)
Continue with Xarelto.  Hemoglobin 14.9 creatinine 0.76

## 2021-02-01 NOTE — Assessment & Plan Note (Signed)
Diabetes managed with Dr. [name].  Agree with current blood pressure management as well.  Her losartan hydrochlorothiazide was discontinued secondary to hypotension.  Today her blood pressure was a little bit elevated however at home she stated it was in the 120 range, excellent.  Continue to monitor this with home blood pressure monitoring.  If it does increase, she may need further agent.

## 2021-02-24 ENCOUNTER — Telehealth: Payer: Self-pay | Admitting: Pharmacist

## 2021-02-24 NOTE — Progress Notes (Addendum)
Chronic Care Management Pharmacy Assistant   Name: Theresa Harrington  MRN: [id number] DOB: 12-02-1955   Reason for Encounter: Medication Review    Recent office visits:  None ID  Recent consult visits:  02/01/21 Jerline Pain, MD -Cardiology (Paroxysmal atrial fibrillation) no med changes  Hospital visits:  None in previous 6 months  Medications: Outpatient Encounter Medications as of 02/24/2021  Medication Sig   Blood Glucose Monitoring Suppl (ONETOUCH VERIO IQ SYSTEM) w/Device KIT USE TO CHECK SUGAR DAILY   cetirizine (ZYRTEC) 10 MG tablet Take 10 mg by mouth daily as needed for allergies.   diltiazem (CARDIZEM CD) 120 MG 24 hr capsule Take 1 capsule (120 mg total) by mouth daily.   dorzolamide-timolol (COSOPT) 22.3-6.8 MG/ML ophthalmic solution Place 1 drop into the right eye 2 (two) times daily.    Empagliflozin-metFORMIN HCl ER (SYNJARDY XR) 12.08-998 MG TB24 Take 1 tablet by mouth daily. Via [employer] patient assistance   ezetimibe (ZETIA) 10 MG tablet TAKE ONE TABLET BY MOUTH EVERYDAY AT BEDTIME   Glucagon (GVOKE HYPOPEN 2-PACK) 1 MG/0.2ML SOAJ Inject 1 Act into the skin daily as needed.   glucose blood test strip Use to check blood sugar twice daily. DX: E11.8   Insulin Glargine (BASAGLAR KWIKPEN) 100 UNIT/ML Inject 50 Units into the skin at bedtime. [place] patient assistance   insulin lispro (HUMALOG) 100 UNIT/ML injection Inject 14-20 Units into the skin 3 (three) times daily before meals. [place] patient assistance   Insulin Pen Needle (PEN NEEDLES) 32G X 4 MM MISC Inject 1 pen as directed 4 (four) times daily. Use to inject levemir or novolog as directed.   latanoprost (XALATAN) 0.005 % ophthalmic solution Place 1 drop into both eyes at bedtime.    magnesium oxide (MAG-OX) 400 MG tablet TAKE ONE TABLET BY MOUTH EVERYDAY AT BEDTIME   nebivolol (BYSTOLIC) 5 MG tablet Take 1 tablet (5 mg total) by mouth daily.   omeprazole (PRILOSEC) 40 MG capsule TAKE ONE  CAPSULE BY MOUTH EVERY MORNING and TAKE ONE CAPSULE BY MOUTH EVERY EVENING   potassium chloride SA (KLOR-CON) 20 MEQ tablet TAKE ONE TABLET BY MOUTH EVERY MORNING   pravastatin (PRAVACHOL) 40 MG tablet TAKE ONE TABLET BY MOUTH EVERYDAY AT BEDTIME   rivaroxaban (XARELTO) 20 MG TABS tablet Take 1 tablet (20 mg total) by mouth daily.   Semaglutide, 1 MG/DOSE, (OZEMPIC, 1 MG/DOSE,) 4 MG/3ML SOPN Inject 1 mg into the skin once a week. Via [employer] patient assistance   thiamine 100 MG tablet Take 1 tablet (100 mg total) by mouth every other day.   No facility-administered encounter medications on file as of 02/24/2021.    BP Readings from Last 3 Encounters:  02/01/21 (!) 150/66  01/21/21 128/76  01/14/21 (!) 162/78    Lab Results  Component Value Date   HGBA1C 6.8 (H) 12/30/2020      Last adherence delivery date:11/09/20      Patient is due for next adherence delivery on: 03/08/21  Spoke with patient on 02/28/21 reviewed medications and coordinated delivery.  This delivery to include: Vials  90 Days  Verio test strips use twice daily Losartan 100 mg daily  Patient declined the following medications this month: Pravastatin 40 mg 1 tab at bedtime Mag oxide 400 mg 1 tab at bedtime Omeparazole 40 mg 1 cap every morning and 1 cap every evening Ezetimibe 10 mg 1 tab daily at bedtime   Is patient in packaging No  No refill request needed.  Confirmed delivery date of 03/08/21, advised patient that pharmacy will contact them the morning of delivery.    Annual wellness visit in last year? Yes Most Recent BP reading:150/66 on 02/01/21  If Diabetic: Most recent A1C reading:12/30/20 Last eye exam / retinopathy screening:11/30/20 Last diabetic foot exam: 12/30/20  Ethelene Hal Clinical Pharmacist Assistant [phone]

## 2021-03-01 ENCOUNTER — Other Ambulatory Visit: Payer: Self-pay | Admitting: Internal Medicine

## 2021-03-07 ENCOUNTER — Telehealth: Payer: Self-pay | Admitting: Internal Medicine

## 2021-03-07 NOTE — Telephone Encounter (Signed)
Dropped off patient assistance forms with documents. Placed forms in the box.

## 2021-03-08 NOTE — Telephone Encounter (Signed)
Patient brought paperwork to renew patient assistance for Synjardy ([employer]) and Bystolic Durenda Hurt Assist).  Completed and faxed Synjardy paperwork to [facility] cares.  Bystolic is now generic and pt has been filling at the pharmacy - unclear if we need to pursue pt assistance for this in 2023.

## 2021-03-09 ENCOUNTER — Telehealth: Payer: Self-pay | Admitting: Pharmacist

## 2021-03-09 NOTE — Progress Notes (Signed)
    Chronic Care Management Pharmacy Assistant   Name: Izetta Sakamoto  MRN: [id number] DOB: 1956-01-01  Patient assistance application for Basaglar/Humalog has been completed on behalf of the patient.  Patient aware they will need to provide requested income verification to be sent the manufacturer.    [place] Pharmacist Assistant [phone]

## 2021-03-10 ENCOUNTER — Institutional Professional Consult (permissible substitution): Payer: PPO | Admitting: Cardiology

## 2021-03-10 ENCOUNTER — Institutional Professional Consult (permissible substitution): Payer: PPO | Admitting: Internal Medicine

## 2021-03-10 NOTE — Progress Notes (Deleted)
Electrophysiology Office Note   Date:  03/10/2021   ID:  Theresa Harrington, DOB January 12, 1956, MRN [id number]  PCP:  [name], MD  Cardiologist:  Marlou Porch Primary Electrophysiologist:  Temica Righetti Meredith Leeds, MD    Chief Complaint: AF   History of Present Illness: Theresa Harrington is a 65 y.o. female who is being seen today for the evaluation of AF at the request of Jerline Pain, MD. Presenting today for electrophysiology evaluation.  She has a history significant for hypertension, hyperlipidemia, atrial fibrillation, type 2 diabetes, peripheral arterial disease status post femoropopliteal bypass in 2016.  Unfortunately she has a history of atrial fibrillation.  She has been having more frequent episodes of atrial fibrillation.  She was seen by her primary physician 01/21/2021 back in atrial fibrillation.  Today, she denies*** symptoms of palpitations, chest pain, shortness of breath, orthopnea, PND, lower extremity edema, claudication, dizziness, presyncope, syncope, bleeding, or neurologic sequela. The patient is tolerating medications without difficulties.    Past Medical History:  Diagnosis Date   Allergy    Anxiety    Pt. denies   Arthritis    right index figer   Asymptomatic cholelithiasis    Atherosclerosis of aorta (HCC)    Cataract    Clotting disorder ([place])    Gallstones 06/2013   GERD (gastroesophageal reflux disease)    History of kidney stones    lithotrispy   Hyperlipidemia    Hypertension    Hypothyroidism    no meds   Iron deficiency anemia    PAD (peripheral artery disease) (HCC)    PAF (paroxysmal atrial fibrillation) (HCC)    Peripheral arterial occlusive disease (HCC)    lower extremities   Pneumonia    PONV (postoperative nausea and vomiting)    Right ureteral stone    Type 2 diabetes mellitus ([place])    Type   Vitamin B 12 deficiency    Wears glasses    Past Surgical History:  Procedure Laterality Date   ABDOMINAL AORTOGRAM W/LOWER EXTREMITY N/A  03/09/2020   Procedure: ABDOMINAL AORTOGRAM W/LOWER EXTREMITY;  Surgeon: Serafina Mitchell, MD;  Location: [place] [facility];  Service: Cardiovascular;  Laterality: N/A;   AMPUTATION Left 07/16/2014   Procedure: LEFT SECOND TOE AMPUTATION;  Surgeon: Serafina Mitchell, MD;  Location: [place];  Service: Vascular;  Laterality: Left;  With Nerve block   AUGMENTATION MAMMAPLASTY     BELPHAROPTOSIS REPAIR     eyelid lift   BIOPSY  10/14/2018   Procedure: BIOPSY;  Surgeon: Rush Landmark Telford Nab., MD;  Location: [place];  Service: Gastroenterology;;   BIOPSY  08/04/2019   Procedure: BIOPSY;  Surgeon: Irving Copas., MD;  Location: [place];  Service: Gastroenterology;;   CARDIOVERSION N/A 09/19/2019   Procedure: CARDIOVERSION;  Surgeon: Buford Dresser, MD;  Location: [place];  Service: Cardiovascular;  Laterality: N/A;   COLONOSCOPY     COMBINED AUGMENTATION MAMMAPLASTY AND ABDOMINOPLASTY  2009   W/  BILATERAL  THIGH LIFT   CYSTO/  RIGHT URETERAL STENT PLACEMENT  12-30-2010   CYSTOSCOPY WITH RETROGRADE PYELOGRAM, URETEROSCOPY AND STENT PLACEMENT Right 10/07/2013   Procedure: CYSTOSCOPY WITH RETROGRADE PYELOGRAM, right URETEROSCOPY AND STENT PLACEMENT, stone extraction;  Surgeon: Arvil Persons, MD;  Location: [facility];  Service: Urology;  Laterality: Right;   CYSTOSCOPY WITH STENT PLACEMENT Right 06/04/2013   Procedure: CYSTOSCOPY WITH STENT PLACEMENT;  Surgeon: Franchot Gallo, MD;  Location: [facility];  Service: Urology;  Laterality: Right;   DILATATION &  CURETTAGE/HYSTEROSCOPY WITH MYOSURE N/A 04/27/2015   Procedure: DILATATION & CURETTAGE/HYSTEROSCOPY WITH MYOSURE;  Surgeon: Terrance Mass, MD;  Location: [place] [facility];  Service: Gynecology;  Laterality: N/A;   ENDARTERECTOMY FEMORAL Left 06/08/2014   Procedure: Left Leg Common Femoral and External Iliac  Endartarectomy with patch Angioplasty;  Surgeon: Rosetta Posner, MD;  Location: [facility];  Service: Vascular;  Laterality:  Left;   ESOPHAGOGASTRODUODENOSCOPY N/A 10/14/2018   Procedure: ESOPHAGOGASTRODUODENOSCOPY (EGD);  Surgeon: Irving Copas., MD;  Location: [place];  Service: Gastroenterology;  Laterality: N/A;   ESOPHAGOGASTRODUODENOSCOPY (EGD) WITH PROPOFOL N/A 11/06/2018   Procedure: ESOPHAGOGASTRODUODENOSCOPY (EGD) WITH PROPOFOL;  Surgeon: Rush Landmark Telford Nab., MD;  Location: [facility];  Service: Gastroenterology;  Laterality: N/A;  RFA   ESOPHAGOGASTRODUODENOSCOPY (EGD) WITH PROPOFOL N/A 02/12/2019   Procedure: ESOPHAGOGASTRODUODENOSCOPY (EGD) WITH PROPOFOL;  Surgeon: Rush Landmark Telford Nab., MD;  Location: [facility];  Service: Gastroenterology;  Laterality: N/A;   ESOPHAGOGASTRODUODENOSCOPY (EGD) WITH PROPOFOL N/A 08/04/2019   Procedure: ESOPHAGOGASTRODUODENOSCOPY (EGD) WITH PROPOFOL;  Surgeon: Rush Landmark Telford Nab., MD;  Location: [place];  Service: Gastroenterology;  Laterality: N/A;  WITH RFA   EXTRACORPOREAL SHOCK WAVE LITHOTRIPSY Right 08-04-2013//   06-23-2013//   01-16-2011   EYE SURGERY Bilateral    cataract   FEMORAL-POPLITEAL BYPASS GRAFT Left 06/08/2014   Procedure: Left Leg Femoral -Popliteal Bypass Graft;  Surgeon: Rosetta Posner, MD;  Location: [facility];  Service: Vascular;  Laterality: Left;   FEMORAL-POPLITEAL BYPASS GRAFT Left 06/09/2014   Procedure: Left Femoral and Popliteal Exposure; Left Femoral to Anterior Tibial Bypass Graft using Propaten 36mm by 80cm Goretex Graft; Left Tibial Endarterectomy; Left Femoraland Popliteal Thrombectomy ;  Surgeon: Serafina Mitchell, MD;  Location: [place];  Service: Vascular;  Laterality: Left;   GI RADIOFREQUENCY ABLATION N/A 11/06/2018   Procedure: GI RADIOFREQUENCY ABLATION;  Surgeon: Irving Copas., MD;  Location: [facility];  Service: Gastroenterology;  Laterality: N/A;   GI RADIOFREQUENCY ABLATION N/A 02/12/2019   Procedure: GI RADIOFREQUENCY ABLATION;  Surgeon: Rush Landmark Telford Nab., MD;  Location: [facility];  Service:  Gastroenterology;  Laterality: N/A;   GI RADIOFREQUENCY ABLATION N/A 08/04/2019   Procedure: GI RADIOFREQUENCY ABLATION;  Surgeon: Rush Landmark Telford Nab., MD;  Location: [place];  Service: Gastroenterology;  Laterality: N/A;   HOLMIUM LASER APPLICATION Right 06/05/8525   Procedure: HOLMIUM LASER APPLICATION;  Surgeon: Arvil Persons, MD;  Location[facility];  Service: Urology;  Laterality: Right;   KIDNEY STONE SURGERY  April 2015   1-2 stones   LAPAROSCOPIC GASTRIC BANDING  05-29-2005   LITHOTRIPSY  2-3 times   LOWER EXTREMITY ANGIOGRAM N/A 06/04/2014   Procedure: LOWER EXTREMITY ANGIOGRAM;  Surgeon: Serafina Mitchell, MD;  Location[facility];  Service: Cardiovascular;  Laterality: N/A;   ORIF FIFTH METACARPAL FX  RIGHT HAND  04-21-2002   REVISION AND RE-SITING LAP-BAND PORT  04-08-2010   W/  UPPER EGD   RIGHT KNEE PATELLECTOMY W/ REPAIR OF EXTENSOR MECHANISM  04-14-2002   TEE WITHOUT CARDIOVERSION N/A 12/24/2019   Procedure: TRANSESOPHAGEAL ECHOCARDIOGRAM (TEE);  Surgeon: Sueanne Margarita, MD;  Location: [facility];  Service: Cardiovascular;  Laterality: N/A;   Toenail removed Left Jan. 21, 2016   2nd toenail-  Dr. Barkley Bruns     Current Outpatient Medications  Medication Sig Dispense Refill   Blood Glucose Monitoring Suppl (ONETOUCH VERIO IQ SYSTEM) w/Device KIT USE TO CHECK SUGAR DAILY 1 kit 0   cetirizine (ZYRTEC) 10 MG tablet Take 10 mg by mouth daily  as needed for allergies.     diltiazem (CARDIZEM CD) 120 MG 24 hr capsule Take 1 capsule (120 mg total) by mouth daily. 90 capsule 0   dorzolamide-timolol (COSOPT) 22.3-6.8 MG/ML ophthalmic solution Place 1 drop into the right eye 2 (two) times daily.      Empagliflozin-metFORMIN HCl ER (SYNJARDY XR) 12.08-998 MG TB24 Take 1 tablet by mouth daily. Via [employer] patient assistance 90 tablet 1   ezetimibe (ZETIA) 10 MG tablet TAKE ONE TABLET BY MOUTH EVERYDAY AT BEDTIME 90 tablet 1   Glucagon (GVOKE HYPOPEN 2-PACK) 1  MG/0.2ML SOAJ Inject 1 Act into the skin daily as needed. 2 mL 5   Insulin Glargine (BASAGLAR KWIKPEN) 100 UNIT/ML Inject 50 Units into the skin at bedtime. Via [employer] patient assistance 30 mL 2   insulin lispro (HUMALOG) 100 UNIT/ML injection Inject 14-20 Units into the skin 3 (three) times daily before meals. [place] patient assistance     Insulin Pen Needle (PEN NEEDLES) 32G X 4 MM MISC Inject 1 pen as directed 4 (four) times daily. Use to inject levemir or novolog as directed. 400 each 3   latanoprost (XALATAN) 0.005 % ophthalmic solution Place 1 drop into both eyes at bedtime.      losartan (COZAAR) 100 MG tablet TAKE ONE TABLET BY MOUTH EVERYDAY AT BEDTIME 90 tablet 1   magnesium oxide (MAG-OX) 400 MG tablet TAKE ONE TABLET BY MOUTH EVERYDAY AT BEDTIME 90 tablet 1   nebivolol (BYSTOLIC) 5 MG tablet Take 1 tablet (5 mg total) by mouth daily. 90 tablet 1   omeprazole (PRILOSEC) 40 MG capsule TAKE ONE CAPSULE BY MOUTH EVERY MORNING and TAKE ONE CAPSULE BY MOUTH EVERY EVENING 180 capsule 1   ONETOUCH VERIO test strip Use to check blood sugar twice daily. 200 strip 3   potassium chloride SA (KLOR-CON) 20 MEQ tablet TAKE ONE TABLET BY MOUTH EVERY MORNING 90 tablet 1   pravastatin (PRAVACHOL) 40 MG tablet TAKE ONE TABLET BY MOUTH EVERYDAY AT BEDTIME 90 tablet 1   rivaroxaban (XARELTO) 20 MG TABS tablet Take 1 tablet (20 mg total) by mouth daily. 90 tablet 1   Semaglutide, 1 MG/DOSE, (OZEMPIC, 1 MG/DOSE,) 4 MG/3ML SOPN Inject 1 mg into the skin once a week. Via [employer] patient assistance 9 mL 1   thiamine 100 MG tablet Take 1 tablet (100 mg total) by mouth every other day. 45 tablet 1   No current facility-administered medications for this visit.    Allergies:   Amlodipine, Atorvastatin, Ampicillin, Codeine, Lisinopril, Penicillins, and Januvia [sitagliptin]   Social History:  The patient  reports that she quit smoking about 19 years ago. Her smoking use included cigarettes. She  has a 28.00 pack-year smoking history. She has never used smokeless tobacco. She reports that she does not drink alcohol and does not use drugs.   Family History:  The patient's family history includes Arthritis in an other family member; Cancer in her brother and another family member; Colon cancer in her maternal aunt; Deep vein thrombosis in her father and sister; Diabetes in her brother, brother, brother, father, and sister; Heart disease in her brother, father, and sister; Hyperlipidemia in her brother, father, mother, and sister; Hypertension in an other family member; Kidney disease in her mother; Other in her mother; Stroke in an other family member.    ROS:  Please see the history of present illness.   Otherwise, review of systems is positive for none.   All  other systems are reviewed and negative.    PHYSICAL EXAM: VS:  LMP  (LMP Unknown)  , BMI There is no height or weight on file to calculate BMI. GEN: Well nourished, well developed, in no acute distress  HEENT: normal  Neck: no JVD, carotid bruits, or masses Cardiac: ***RRR; no murmurs, rubs, or gallops,no edema  Respiratory:  clear to auscultation bilaterally, normal work of breathing GI: soft, nontender, nondistended, + BS MS: no deformity or atrophy  Skin: warm and dry Neuro:  Strength and sensation are intact Psych: euthymic mood, full affect  EKG:  EKG {ACTION; IS/IS VPE:[id number]} ordered today. Personal review of the ekg ordered *** shows ***  Recent Labs: 12/30/2020: ALT 13; BUN 11; Creatinine, Ser 0.76; Hemoglobin 14.9; Platelets 217.0; Potassium 4.0; Sodium 141; TSH 0.54    Lipid Panel     Component Value Date/Time   CHOL 171 12/30/2020 1046   TRIG 130.0 12/30/2020 1046   HDL 55.70 12/30/2020 1046   CHOLHDL 3 12/30/2020 1046   VLDL 26.0 12/30/2020 1046   LDLCALC 89 12/30/2020 1046   LDLDIRECT 160.2 03/01/2010 1001     Wt Readings from Last 3 Encounters:  02/01/21 174 lb 1.6 oz (79 kg)  01/21/21 166 lb  6.4 oz (75.5 kg)  01/14/21 169 lb (76.7 kg)      Other studies Reviewed: Additional studies/ records that were reviewed today include: TTE 10/07/20  Review of the above records today demonstrates:   1. Left ventricular ejection fraction, by estimation, is 65 to 70%. The  left ventricle has normal function. The left ventricle has no regional  wall motion abnormalities. There is mild left ventricular hypertrophy.  Left ventricular diastolic parameters  are consistent with Grade II diastolic dysfunction (pseudonormalization).  Elevated left atrial pressure.   2. Right ventricular systolic function is normal. The right ventricular  size is normal. Tricuspid regurgitation signal is inadequate for assessing  PA pressure.   3. Left atrial size was severely dilated.   4. Right atrial size was mildly dilated.   5. The mitral valve is abnormal. Severe mitral annular calcification. No  evidence of mitral valve regurgitation. Mild to moderate mitral stenosis.  MG , MVA 1.4 cm^2 by continuity equation   6. The aortic valve is tricuspid. Aortic valve regurgitation is not  visualized. Mild to moderate aortic valve sclerosis/calcification is  present, without any evidence of aortic stenosis.   7. The inferior vena cava is normal in size with greater than 50%  respiratory variability, suggesting right atrial pressure of 3 mmHg.    ASSESSMENT AND PLAN:  1.  Persistent atrial fibrillation: Echo with severe left atrial enlargement.  Currently on Xarelto 20 mg daily.  CHA2DS2-VASc of at least 3.  2.  Mild mitral valve stenosis: Has remained stable on serial echoes.  She has severe mitral annular calcification.  Plan per primary cardiology.  3.  Peripheral arterial disease: Status post femoropopliteal bypass.  Follows with vascular surgery.  We Sadik Piascik need to discuss left-sided venous access with vascular surgery.  Current medicines are reviewed at length with the patient today.   The patient  {ACTIONS; HAS/DOES NOT HAVE:19233} concerns regarding her medicines.  The following changes were made today:  {NONE DEFAULTED:18576}  Labs/ tests ordered today include: *** No orders of the defined types were placed in this encounter.    Disposition:   FU with Cedar Ditullio {gen number [id number]} {Days to years:10300}  Signed, Laquita Harlan Jorja Loa, MD  03/10/2021 8:54 AM  [address] [place] [address] [phone] (office) [phone] (fax)

## 2021-03-10 NOTE — Telephone Encounter (Signed)
Patient section of [employer] application left in front office cabinet for patient to sign.

## 2021-03-10 NOTE — Progress Notes (Signed)
Cardiology Office Note    Date:  02/01/2021    ID:  Theresa Harrington, DOB 1956-03-31, MRN [id number]   PCP:  [name], MD   Cardiologist:  [name], MD  Electrophysiologist:  None    Evaluation Performed:  Follow-Up Visit   History of Present Illness:     Theresa Harrington is a 65 y.o. female here for the follow up of atrial fibrillation with RVR per Dr. Scarlette Calico.   Severe mitral annular calcification of mitral valve with thickening of the posterior mitral valve leaflet with edges immobile.  There is very mild stenosis.  3 mmHg gradient.  No mitral regurgitation.   At her last appointment, she was doing well.   Today: Overall she appears well.   Previously, her blood pressure was running high, about 637C systolic. After a medication change, it dropped to the 70s/60s. When she was last seen by Dr. [name] (01/21/2021) she was noted to be back in atrial fibrillation. She was not able to feel when she returned to Afib. When she was in Afib her heart rate would fluctuate such as from 120 bpm, to 70, and then to 99 bpm.   At home her blood pressure has generally been stable. This morning it was 128/60. She has not been taking losartan because her blood pressure has been low. However, she did take it a few times recently because her blood pressure seemed to be gradually increasing again.   She denies any palpitations, chest pain, or shortness of breath. No lightheadedness, headaches, syncope, orthopnea, or PND. Also has no lower extremity edema or exertional symptoms.         Past Medical History:  Diagnosis Date   Allergy     Anxiety      Pt. denies   Arthritis      right index figer   Asymptomatic cholelithiasis     Atherosclerosis of aorta (HCC)     Cataract     Clotting disorder ([place])     Gallstones 06/2013   GERD (gastroesophageal reflux disease)     History of kidney stones      lithotrispy   Hyperlipidemia     Hypertension     Hypothyroidism      no meds   Iron  deficiency anemia     PAD (peripheral artery disease) (HCC)     PAF (paroxysmal atrial fibrillation) (HCC)     Peripheral arterial occlusive disease (HCC)      lower extremities   Pneumonia     PONV (postoperative nausea and vomiting)     Right ureteral stone     Type 2 diabetes mellitus ([place])      Type   Vitamin B 12 deficiency     Wears glasses           Past Surgical History:  Procedure Laterality Date   ABDOMINAL AORTOGRAM W/LOWER EXTREMITY N/A 03/09/2020    Procedure: ABDOMINAL AORTOGRAM W/LOWER EXTREMITY;  Surgeon: Serafina Mitchell, MD;  Location: [place] [facility];  Service: Cardiovascular;  Laterality: N/A;   AMPUTATION Left 07/16/2014    Procedure: LEFT SECOND TOE AMPUTATION;  Surgeon: Serafina Mitchell, MD;  Location: [place];  Service: Vascular;  Laterality: Left;  With Nerve block   AUGMENTATION MAMMAPLASTY       BELPHAROPTOSIS REPAIR        eyelid lift   BIOPSY   10/14/2018    Procedure: BIOPSY;  Surgeon: Irving Copas., MD;  Location: [facility];  Service: Gastroenterology;;   BIOPSY   08/04/2019    Procedure: BIOPSY;  Surgeon: Irving Copas., MD;  Location: [place];  Service: Gastroenterology;;   CARDIOVERSION N/A 09/19/2019    Procedure: CARDIOVERSION;  Surgeon: Buford Dresser, MD;  Location: [place];  Service: Cardiovascular;  Laterality: N/A;   COLONOSCOPY       COMBINED AUGMENTATION MAMMAPLASTY AND ABDOMINOPLASTY   2009    W/  BILATERAL  THIGH LIFT   CYSTO/  RIGHT URETERAL STENT PLACEMENT   12-30-2010   CYSTOSCOPY WITH RETROGRADE PYELOGRAM, URETEROSCOPY AND STENT PLACEMENT Right 10/07/2013    Procedure: CYSTOSCOPY WITH RETROGRADE PYELOGRAM, right URETEROSCOPY AND STENT PLACEMENT, stone extraction;  Surgeon: Arvil Persons, MD;  Location: [facility];  Service: Urology;  Laterality: Right;   CYSTOSCOPY WITH STENT PLACEMENT Right 06/04/2013    Procedure: CYSTOSCOPY WITH STENT PLACEMENT;  Surgeon: Franchot Gallo, MD;   Location: [facility];  Service: Urology;  Laterality: Right;   DILATATION & CURETTAGE/HYSTEROSCOPY WITH MYOSURE N/A 04/27/2015    Procedure: DILATATION & CURETTAGE/HYSTEROSCOPY WITH MYOSURE;  Surgeon: Terrance Mass, MD;  Location: [facility];  Service: Gynecology;  Laterality: N/A;   ENDARTERECTOMY FEMORAL Left 06/08/2014    Procedure: Left Leg Common Femoral and External Iliac  Endartarectomy with patch Angioplasty;  Surgeon: Rosetta Posner, MD;  Location[facility];  Service: Vascular;  Laterality: Left;   ESOPHAGOGASTRODUODENOSCOPY N/A 10/14/2018    Procedure: ESOPHAGOGASTRODUODENOSCOPY (EGD);  Surgeon: Irving Copas., MD;  Location: [place];  Service: Gastroenterology;  Laterality: N/A;   ESOPHAGOGASTRODUODENOSCOPY (EGD) WITH PROPOFOL N/A 11/06/2018    Procedure: ESOPHAGOGASTRODUODENOSCOPY (EGD) WITH PROPOFOL;  Surgeon: Rush Landmark Telford Nab., MD;  Location: [facility];  Service: Gastroenterology;  Laterality: N/A;  RFA   ESOPHAGOGASTRODUODENOSCOPY (EGD) WITH PROPOFOL N/A 02/12/2019    Procedure: ESOPHAGOGASTRODUODENOSCOPY (EGD) WITH PROPOFOL;  Surgeon: Rush Landmark Telford Nab., MD;  Location: [facility];  Service: Gastroenterology;  Laterality: N/A;   ESOPHAGOGASTRODUODENOSCOPY (EGD) WITH PROPOFOL N/A 08/04/2019    Procedure: ESOPHAGOGASTRODUODENOSCOPY (EGD) WITH PROPOFOL;  Surgeon: Rush Landmark Telford Nab., MD;  Location: [place];  Service: Gastroenterology;  Laterality: N/A;  WITH RFA   EXTRACORPOREAL SHOCK WAVE LITHOTRIPSY Right 08-04-2013//   06-23-2013//   01-16-2011   EYE SURGERY Bilateral      cataract   FEMORAL-POPLITEAL BYPASS GRAFT Left 06/08/2014    Procedure: Left Leg Femoral -Popliteal Bypass Graft;  Surgeon: Rosetta Posner, MD;  Location: [place] [facility];  Service: Vascular;  Laterality: Left;   FEMORAL-POPLITEAL BYPASS GRAFT Left 06/09/2014    Procedure: Left Femoral and Popliteal Exposure; Left Femoral to Anterior Tibial Bypass Graft using Propaten 67m by 80cm Goretex Graft; Left Tibial  Endarterectomy; Left Femoraland Popliteal Thrombectomy ;  Surgeon: VSerafina Mitchell MD;  [place]  Service: Vascular;  Laterality: Left;   GI RADIOFREQUENCY ABLATION N/A 11/06/2018    Procedure: GI RADIOFREQUENCY ABLATION;  Surgeon: MIrving Copas, MD;  Location[facility];  Service: Gastroenterology;  Laterality: N/A;   GI RADIOFREQUENCY ABLATION N/A 02/12/2019    Procedure: GI RADIOFREQUENCY ABLATION;  Surgeon: MRush LandmarkGTelford Nab, MD;  Location: [facility];  Service: Gastroenterology;  Laterality: N/A;   GI RADIOFREQUENCY ABLATION N/A 08/04/2019    Procedure: GI RADIOFREQUENCY ABLATION;  Surgeon: MRush LandmarkGTelford Nab, MD;  [place]  Service: Gastroenterology;  Laterality: N/A;   HOLMIUM LASER APPLICATION Right 68/0/9983   Procedure: HOLMIUM LASER APPLICATION;  Surgeon: MArvil Persons MD;  [facility]  Service:  Urology;  Laterality: Right;   KIDNEY STONE SURGERY   April 2015    1-2 stones   LAPAROSCOPIC GASTRIC BANDING   05-29-2005   LITHOTRIPSY   2-3 times   LOWER EXTREMITY ANGIOGRAM N/A 06/04/2014    Procedure: LOWER EXTREMITY ANGIOGRAM;  Surgeon: Serafina Mitchell, MD;  Location: [facility];  Service: Cardiovascular;  Laterality: N/A;   ORIF FIFTH METACARPAL FX  RIGHT HAND   04-21-2002   REVISION AND RE-SITING LAP-BAND PORT   04-08-2010    W/  UPPER EGD   RIGHT KNEE PATELLECTOMY W/ REPAIR OF EXTENSOR MECHANISM   04-14-2002   TEE WITHOUT CARDIOVERSION N/A 12/24/2019    Procedure: TRANSESOPHAGEAL ECHOCARDIOGRAM (TEE);  Surgeon: Sueanne Margarita, MD;  Location: [facility];  Service: Cardiovascular;  Laterality: N/A;   Toenail removed Left Jan. 21, 2016    2nd toenail-  Dr. Barkley Bruns      Active Medications      Current Meds  Medication Sig   Blood Glucose Monitoring Suppl (ONETOUCH VERIO IQ SYSTEM) w/Device KIT USE TO CHECK SUGAR DAILY   cetirizine (ZYRTEC) 10 MG tablet Take 10 mg by mouth daily as needed for allergies.    diltiazem (CARDIZEM CD) 120 MG 24 hr capsule Take 1 capsule (120 mg total) by mouth daily.   dorzolamide-timolol (COSOPT) 22.3-6.8 MG/ML ophthalmic solution Place 1 drop into the right eye 2 (two) times daily.    Empagliflozin-metFORMIN HCl ER (SYNJARDY XR) 12.08-998 MG TB24 Take 1 tablet by mouth daily. Via [employer] patient assistance   ezetimibe (ZETIA) 10 MG tablet TAKE ONE TABLET BY MOUTH EVERYDAY AT BEDTIME   Glucagon (GVOKE HYPOPEN 2-PACK) 1 MG/0.2ML SOAJ Inject 1 Act into the skin daily as needed.   glucose blood test strip Use to check blood sugar twice daily. DX: E11.8   Insulin Glargine (BASAGLAR KWIKPEN) 100 UNIT/ML Inject 50 Units into the skin at bedtime. [place] patient assistance   insulin lispro (HUMALOG) 100 UNIT/ML injection Inject 14-20 Units into the skin 3 (three) times daily before meals. [place] patient assistance   Insulin Pen Needle (PEN NEEDLES) 32G X 4 MM MISC Inject 1 pen as directed 4 (four) times daily. Use to inject levemir or novolog as directed.   latanoprost (XALATAN) 0.005 % ophthalmic solution Place 1 drop into both eyes at bedtime.    magnesium oxide (MAG-OX) 400 MG tablet TAKE ONE TABLET BY MOUTH EVERYDAY AT BEDTIME   nebivolol (BYSTOLIC) 5 MG tablet Take 1 tablet (5 mg total) by mouth daily.   omeprazole (PRILOSEC) 40 MG capsule TAKE ONE CAPSULE BY MOUTH EVERY MORNING and TAKE ONE CAPSULE BY MOUTH EVERY EVENING   potassium chloride SA (KLOR-CON) 20 MEQ tablet TAKE ONE TABLET BY MOUTH EVERY MORNING   pravastatin (PRAVACHOL) 40 MG tablet TAKE ONE TABLET BY MOUTH EVERYDAY AT BEDTIME   rivaroxaban (XARELTO) 20 MG TABS tablet Take 1 tablet (20 mg total) by mouth daily.   Semaglutide, 1 MG/DOSE, (OZEMPIC, 1 MG/DOSE,) 4 MG/3ML SOPN Inject 1 mg into the skin once a week. Via [employer] patient assistance   thiamine 100 MG tablet Take 1 tablet (100 mg total) by mouth every other day.        Allergies:   Amlodipine, Atorvastatin, Ampicillin, Codeine,  Lisinopril, Penicillins, and Januvia [sitagliptin]    Social History         Tobacco Use   Smoking status: Former      Packs/day: 1.00  Years: 28.00      Pack years: 28.00      Types: Cigarettes      Quit date: 09/05/2001      Years since quitting: 19.4   Smokeless tobacco: Never  Vaping Use   Vaping Use: Never used  Substance Use Topics   Alcohol use: No      Alcohol/week: 0.0 standard drinks   Drug use: No      Family Hx: The patient's family history includes Arthritis in an other family member; Cancer in her brother and another family member; Colon cancer in her maternal aunt; Deep vein thrombosis in her father and sister; Diabetes in her brother, brother, brother, father, and sister; Heart disease in her brother, father, and sister; Hyperlipidemia in her brother, father, mother, and sister; Hypertension in an other family member; Kidney disease in her mother; Other in her mother; Stroke in an other family member. There is no history of Esophageal cancer, Stomach cancer, Inflammatory bowel disease, Liver disease, or Pancreatic cancer.   ROS:   Please see the history of present illness.    All other systems reviewed and are negative.     Prior CV studies:   The following studies were reviewed today:   Carotid Duplex 10/25/2020: Summary:  Right Carotid: Velocities in the right ICA are consistent with a 1-39%  stenosis.   Left Carotid: Velocities in the left ICA are consistent with a 1-39%  stenosis.   Vertebrals:  Bilateral vertebral arteries demonstrate antegrade flow.  Subclavians: Normal flow hemodynamics were seen in bilateral subclavian arteries.    ABI 10/25/2020: Summary:  Right: Resting right ankle-brachial index indicates moderate right lower  extremity arterial disease. The right toe-brachial index is abnormal.   Left: Resting left ankle-brachial index indicates moderate left lower  extremity arterial disease. The left toe-brachial index is abnormal.     Echo 10/07/2020: 1. Left ventricular ejection fraction, by estimation, is 65 to 70%. The  left ventricle has normal function. The left ventricle has no regional  wall motion abnormalities. There is mild left ventricular hypertrophy.  Left ventricular diastolic parameters  are consistent with Grade II diastolic dysfunction (pseudonormalization).  Elevated left atrial pressure.   2. Right ventricular systolic function is normal. The right ventricular  size is normal. Tricuspid regurgitation signal is inadequate for assessing  PA pressure.   3. Left atrial size was severely dilated.   4. Right atrial size was mildly dilated.   5. The mitral valve is abnormal. Severe mitral annular calcification. No  evidence of mitral valve regurgitation. Mild to moderate mitral stenosis.  MG 70mHg, MVA 1.4 cm^2 by continuity equation   6. The aortic valve is tricuspid. Aortic valve regurgitation is not  visualized. Mild to moderate aortic valve sclerosis/calcification is  present, without any evidence of aortic stenosis.   7. The inferior vena cava is normal in size with greater than 50%  respiratory variability, suggesting right atrial pressure of 3 mmHg.    Abdominal aortogram 03/09/2020: Findings:               Aortogram: No significant renal artery stenosis was identified.  The infrarenal abdominal aorta is widely patent.  Bilateral common and external iliac arteries widely patent.               Right Lower Extremity: Diffuse calcific disease within the right common femoral artery with approximate 70% stenosis.  The profundofemoral artery is patent.  The superficial femoral artery is a flush occlusion.  There  is reconstitution from geniculate collaterals of the anterior tibial artery which backfills into a small below-knee popliteal artery.  There does appear to be opacification of all 3 tibial vessels however they are very small in caliber               Left Lower Extremity: Left common femoral  profundofemoral artery widely patent.  The femoral-popliteal bypass graft is occluded.  The superficial femoral artery is occluded.  There is reconstitution of the anterior tibial artery and opacification of all 3 tibial vessels which are very small in caliber   Intervention: None, a minx was used for closure   Impression:               #1  Occluded left femoral-popliteal bypass graft and left superficial femoral artery               #2  High-grade calcific stenosis within the right common femoral artery, greater than 70%.  There is a flush occlusion of the right superficial femoral artery with reconstitution of the anterior tibial artery with backfilling of the below-knee popliteal artery.  The tibial vessels are small in caliber.               #3  The patient will be considered for right femoral endarterectomy with femoral below-knee popliteal artery bypass graft   TEE 12/24/2019:  1. Left ventricular ejection fraction, by estimation, is 60 to 65%. The  left ventricle has normal function. The left ventricle has no regional  wall motion abnormalities.   2. Right ventricular systolic function is normal. The right ventricular  size is mildly enlarged.   3. Left atrial size was moderately dilated. No left atrial/left atrial  appendage thrombus was detected.   4. 3D imaging of mitral valve was performed. The mitral valve is  degenerative in appearance. There is severe thickening of the posterior  mitral valve leaflet(s). There is severe calcification of the posterior  mitral valve leaflet(s). Fixed posterior  leaflet of the mitral valve leaflets. Severe mitral annular calcification.  No evidence of mitral valve regurgitation. Very Mild mitral stenosis. The  mean mitral valve gradient is 2.5 mmHg.   5. The aortic valve is normal in structure. Aortic valve regurgitation is  trivial. Mild aortic valve sclerosis is present, with no evidence of  aortic valve stenosis.   6. The inferior vena cava is  normal in size with greater than 50%  respiratory variability, suggesting right atrial pressure of 3 mmHg.   Conclusion(s)/Recommendation(s): Normal biventricular function without  evidence of hemodynamically significant valvular heart disease.    Labs/Other Tests and Data Reviewed:     EKG:  EKG is personally reviewed and interpreted. 02/01/2021: Sinus bradycardia. Rate 56 bpm.   Recent Labs: 12/30/2020: ALT 13; BUN 11; Creatinine, Ser 0.76; Hemoglobin 14.9; Platelets 217.0; Potassium 4.0; Sodium 141; TSH 0.54    Recent Lipid Panel Labs (Brief)       Lab Results  Component Value Date/Time    CHOL 171 12/30/2020 10:46 AM    TRIG 130.0 12/30/2020 10:46 AM    HDL 55.70 12/30/2020 10:46 AM    CHOLHDL 3 12/30/2020 10:46 AM    LDLCALC 89 12/30/2020 10:46 AM    LDLDIRECT 160.2 03/01/2010 10:01 AM           Wt Readings from Last 3 Encounters:  02/01/21 174 lb 1.6 oz (79 kg)  01/21/21 166 lb 6.4 oz (75.5 kg)  01/14/21 169 lb (76.7 kg)  Objective:     VS:  BP (!) 150/66   Pulse (!) 56   Ht '5\' 4"'  (1.626 m)   Wt 174 lb 1.6 oz (79 kg)   LMP  (LMP Unknown)   BMI 29.88 kg/m         Wt Readings from Last 3 Encounters:  02/01/21 174 lb 1.6 oz (79 kg)  01/21/21 166 lb 6.4 oz (75.5 kg)  01/14/21 169 lb (76.7 kg)      GEN: Well nourished, well developed in no acute distress HEENT: Normal NECK: No JVD; No carotid bruits LYMPHATICS: No lymphadenopathy CARDIAC: RRR, soft holosystolic murmur, rubs, gallops RESPIRATORY:  Clear to auscultation without rales, wheezing or rhonchi  ABDOMEN: Soft, non-tender, non-distended MUSCULOSKELETAL:  No edema; No deformity  SKIN: Warm and dry NEUROLOGIC:  Alert and oriented x 3 PSYCHIATRIC:  Normal affect      ASSESSMENT & PLAN:   Atrial fibrillation (HCC) Echocardiogram reviewed, severe left atrial enlargement.  She is having paroxysmal atrial fibrillation.  Appreciate Dr. Scarlette Calico starting her on Cardizem 120 CD once a day.  She is  also on Bystolic 5 mg daily.   I would like for her to sit down and talk with electrophysiology regarding potential ablation given her paroxysmal atrial fibrillation.  She does have severe left atrial enlargement and what looks like mild mitral stenosis.  At this point, she is not in need of valve surgery and if we can ward off future episodes of atrial fibrillation this may be helpful for her overall.  Most of the time, she does not feel this atrial fibrillation and my concern is that she may be in rapid ventricular response which could lead to cardiac deterioration over time without continued excellent rhythm control.   Continue with Xarelto for anticoagulation   Diabetes mellitus with coincident hypertension ([place]) Diabetes managed with Dr. [name].  Agree with current blood pressure management as well.  Her losartan hydrochlorothiazide was discontinued secondary to hypotension.  Today her blood pressure was a little bit elevated however at home she stated it was in the 120 range, excellent.  Continue to monitor this with home blood pressure monitoring.  If it does increase, she may need further agent.   Chronic anticoagulation Continue with Xarelto.  Hemoglobin 14.9 creatinine 0.76   Mitral valve stenosis Very mild on ECHO, continue to monitor. She is not in range for replacement.       Medication Adjustments/Labs and Tests Ordered: Current medicines are reviewed at length with the patient today.  Concerns regarding medicines are outlined above.    Tests Ordered:    Orders Placed This Encounter  Procedures   Ambulatory referral to Cardiac Electrophysiology   EKG 12-Lead        Medication Changes: No orders of the defined types were placed in this encounter.     Follow Up:  6 months.   I,Mathew Stumpf,acting as a Education administrator for [employer], MD.,have documented all relevant documentation on the behalf of [name], MD,as directed by  [name], MD while in the presence of [name], MD.   I, [name], MD, have reviewed all documentation for this visit. The documentation on 02/01/21 for the exam, diagnosis, procedures, and orders are all accurate and complete.    Signed, [name], MD  02/01/2021 1:18 PM    [facility]     Note created because prior was under scribes name.  [name], MD

## 2021-03-17 DIAGNOSIS — Z8631 Personal history of diabetic foot ulcer: Secondary | ICD-10-CM | POA: Diagnosis not present

## 2021-03-17 DIAGNOSIS — S98132A Complete traumatic amputation of one left lesser toe, initial encounter: Secondary | ICD-10-CM | POA: Diagnosis not present

## 2021-03-17 DIAGNOSIS — E1151 Type 2 diabetes mellitus with diabetic peripheral angiopathy without gangrene: Secondary | ICD-10-CM | POA: Diagnosis not present

## 2021-03-23 NOTE — Telephone Encounter (Signed)
Pt stopped in to sign [employer] application. Placed in Pharmacy mailbox.

## 2021-03-29 ENCOUNTER — Other Ambulatory Visit: Payer: Self-pay | Admitting: Internal Medicine

## 2021-03-29 ENCOUNTER — Institutional Professional Consult (permissible substitution): Payer: PPO | Admitting: Internal Medicine

## 2021-03-29 DIAGNOSIS — I10 Essential (primary) hypertension: Secondary | ICD-10-CM

## 2021-03-29 DIAGNOSIS — I4891 Unspecified atrial fibrillation: Secondary | ICD-10-CM

## 2021-04-05 ENCOUNTER — Telehealth: Payer: PPO

## 2021-04-05 NOTE — Chronic Care Management (AMB) (Signed)
My Theresa Harrington and Theresa Harrington cares PAP applications submitted for approval   Theresa Harrington, PharmD Clinical Pharmacist, [place]

## 2021-04-15 ENCOUNTER — Other Ambulatory Visit: Payer: Self-pay | Admitting: Nurse Practitioner

## 2021-04-15 ENCOUNTER — Ambulatory Visit: Payer: PPO | Admitting: Cardiology

## 2021-04-15 ENCOUNTER — Encounter: Payer: Self-pay | Admitting: Cardiology

## 2021-04-15 ENCOUNTER — Institutional Professional Consult (permissible substitution): Payer: PPO | Admitting: Cardiology

## 2021-04-15 ENCOUNTER — Other Ambulatory Visit: Payer: Self-pay

## 2021-04-15 VITALS — BP 130/70 | HR 58 | Ht 64.0 in | Wt 168.4 lb

## 2021-04-15 DIAGNOSIS — I10 Essential (primary) hypertension: Secondary | ICD-10-CM

## 2021-04-15 DIAGNOSIS — Z01818 Encounter for other preprocedural examination: Secondary | ICD-10-CM

## 2021-04-15 DIAGNOSIS — Z01812 Encounter for preprocedural laboratory examination: Secondary | ICD-10-CM

## 2021-04-15 DIAGNOSIS — I4819 Other persistent atrial fibrillation: Secondary | ICD-10-CM | POA: Diagnosis not present

## 2021-04-15 NOTE — Patient Instructions (Signed)
Medication Instructions:  Your physician recommends that you continue on your current medications as directed. Please refer to the Current Medication list given to you today.  *If you need a refill on your cardiac medications before your next appointment, please call your pharmacy*   Lab Work: Pre procedure labs :  BMP & CBC  If you have labs (blood work) drawn today and your tests are completely normal, you will receive your results only by: [place] (if you have MyChart) OR A paper copy in the mail If you have any lab test that is abnormal or we need to change your treatment, we will call you to review the results.   Testing/Procedures: Your physician has requested that you have cardiac CT within 7 days PRIOR to your ablation. Cardiac computed tomography (CT) is a painless test that uses an x-ray machine to take clear, detailed pictures of your heart.  Please follow instruction below located under "other instructions". You will get a call from our office to schedule the date for this test.  Your physician has recommended that you have an ablation. Catheter ablation is a medical procedure used to treat some cardiac arrhythmias (irregular heartbeats). During catheter ablation, a long, thin, flexible tube is put into a blood vessel in your groin (upper thigh), or neck. This tube is called an ablation catheter. It is then guided to your heart through the blood vessel. Radio frequency waves destroy small areas of heart tissue where abnormal heartbeats may cause an arrhythmia to start. Please follow instruction below located under "other instructions".   Follow-Up: At [facility], you and your health needs are our priority.  As part of our continuing mission to provide you with exceptional heart care, we have created designated Provider Care Teams.  These Care Teams include your primary Cardiologist (physician) and Advanced Practice Providers (APPs -  Physician Assistants and Nurse  Practitioners) who all work together to provide you with the care you need, when you need it.  Your next appointment:   1 month(s) after your ablation  The format for your next appointment:   In Person  Provider:   [facility]ib clinic   Thank you for choosing [facility]!!   Trinidad Curet, RN [phone]    Other Instructions   CT INSTRUCTIONS Your cardiac CT will be scheduled at:  [facility] [address] [place], [place] [address] [phone]  Please arrive at the [facility] main entrance of [facility] 30 minutes prior to test start time. Proceed to the [facility] Radiology Department (first floor) to check-in and test prep.   Please follow these instructions carefully (unless otherwise directed):  On the Night Before the Test: Be sure to Drink plenty of water. Do not consume any caffeinated/decaffeinated beverages or chocolate 12 hours prior to your test. Do not take any antihistamines 12 hours prior to your test.  On the Day of the Test: Drink plenty of water until 1 hour prior to the test. Do not eat any food 4 hours prior to the test. You may take your regular medications prior to the test.  HOLD Furosemide/Hydrochlorothiazide morning of the test. FEMALES- please wear underwire-free bra if available      After the Test: Drink plenty of water. After receiving IV contrast, you may experience a mild flushed feeling. This is normal. On occasion, you may experience a mild rash up to 24 hours after the test. This is not dangerous. If this occurs, you can take  Benadryl 25 mg and increase your fluid intake. If you experience trouble breathing, this can be serious. If it is severe call 911 IMMEDIATELY. If it is mild, please call our office. If you take any of these medications: Glipizide/Metformin, Avandament, Glucavance, please do not take 48 hours after completing test unless otherwise instructed.   Once we have confirmed authorization  from your insurance company, we will call you to set up a date and time for your test. Based on how quickly your insurance processes prior authorizations requests, please allow up to 4 weeks to be contacted for scheduling your Cardiac CT appointment. Be advised that routine Cardiac CT appointments could be scheduled as many as 8 weeks after your provider has ordered it.  For non-scheduling related questions, please contact the cardiac imaging nurse navigator should you have any questions/concerns: Marchia Bond, Cardiac Imaging Nurse Navigator Gordy Clement, Cardiac Imaging Nurse Navigator  [facility] Direct Office Dial: [phone]   For scheduling needs, including cancellations and rescheduling, please call [place], [phone].      Electrophysiology/Ablation Procedure Instructions   You are scheduled for a(n)  ablation on 07/12/2021 with Dr. Allegra Lai.   1.   Pre procedure testing-             A.  LAB WORK --- On 07/01/2021 for your pre procedure blood work.  You do NOT need to be fasting.  You can stop by the office anytime between 7:30 am - 4:30 pm   On the day of your procedure 07/12/2021 you will go to [facility] hospital ([address]) at 5:30 am.  Dennis Bast will go to the main entrance A [employer]) and enter where the [employer] are.  Your driver will drop you off and you will head down the hallway to ADMITTING.  You may have one support person come in to the hospital with you.  They will be asked to wait in the waiting room. It is OK to have someone drop you off and come back when you are ready to be discharged.   3.   Do not eat or drink after midnight prior to your procedure.   4.   On the morning of your procedure do NOT take any medication. Do not miss any doses of your blood thinner prior to the morning of your procedure or your procedure will need to be rescheduled.   5.  Plan for an overnight stay but you may be discharged after  your procedure, if you use your phone frequently bring your phone charger. If you are discharged after your procedure you will need someone to drive you home and be with you for 24 hours after your procedure.   6. You will follow up with the AFIB clinic 4 weeks after your procedure.  You will follow up with Dr. Curt Bears  3 months after your procedure.  These appointments will be made for you.   7. FYI: For your safety, and to allow [place] to monitor your vital signs accurately during the surgery/procedure we request that if you have artificial nails, gel coating, SNS etc. Please have those removed prior to your surgery/procedure. Not having the nail coverings /polish removed may result in cancellation or delay of your surgery/procedure.  * If you have ANY questions please call the office [phone] and ask for Barba Solt RN or send me a MyChart message   * Occasionally, EP Studies and ablations can become lengthy.  Please make your  family aware of this before your procedure starts.  Average time ranges from 2-8 hours for EP studies/ablations.  Your physician will call your family after the procedure with the results.                                   Cardiac Ablation Cardiac ablation is a procedure to destroy (ablate) some heart tissue that is sending bad signals. These bad signals cause problems in heart rhythm. The heart has many areas that make these signals. If there are problems in these areas, they can make the heart beat in a way that is not normal. Destroying some tissues can help make the heart rhythm normal. Tell your doctor about: Any allergies you have. All medicines you are taking. These include vitamins, herbs, eye drops, creams, and over-the-counter medicines. Any problems you or family members have had with medicines that make you fall asleep (anesthetics). Any blood disorders you have. Any surgeries you have had. Any medical conditions you have, such as kidney failure. Whether you  are pregnant or may be pregnant. What are the risks? This is a safe procedure. But problems may occur, including: Infection. Bruising and bleeding. Bleeding into the chest. Stroke or blood clots. Damage to nearby areas of your body. Allergies to medicines or dyes. The need for a pacemaker if the normal system is damaged. Failure of the procedure to treat the problem. What happens before the procedure? Medicines Ask your doctor about: Changing or stopping your normal medicines. This is important. Taking aspirin and ibuprofen. Do not take these medicines unless your doctor tells you to take them. Taking other medicines, vitamins, herbs, and supplements. General instructions Follow instructions from your doctor about what you cannot eat or drink. Plan to have someone take you home from the hospital or clinic. If you will be going home right after the procedure, plan to have someone with you for 24 hours. Ask your doctor what steps will be taken to prevent infection. What happens during the procedure?  An IV tube will be put into one of your veins. You will be given a medicine to help you relax. The skin on your neck or groin will be numbed. A cut (incision) will be made in your neck or groin. A needle will be put through your cut and into a large vein. A tube (catheter) will be put into the needle. The tube will be moved to your heart. Dye may be put through the tube. This helps your doctor see your heart. Small devices (electrodes) on the tube will send out signals. A type of energy will be used to destroy some heart tissue. The tube will be taken out. Pressure will be held on your cut. This helps stop bleeding. A bandage will be put over your cut. The exact procedure may vary among doctors and hospitals. What happens after the procedure? You will be watched until you leave the hospital or clinic. This includes checking your heart rate, breathing rate, oxygen, and blood  pressure. Your cut will be watched for bleeding. You will need to lie still for a few hours. Do not drive for 24 hours or as long as your doctor tells you. Summary Cardiac ablation is a procedure to destroy some heart tissue. This is done to treat heart rhythm problems. Tell your doctor about any medical conditions you may have. Tell him or her about all medicines you are taking  to treat them. This is a safe procedure. But problems may occur. These include infection, bruising, bleeding, and damage to nearby areas of your body. Follow what your doctor tells you about food and drink. You may also be told to change or stop some of your medicines. After the procedure, do not drive for 24 hours or as long as your doctor tells you. This information is not intended to replace advice given to you by your health care provider. Make sure you discuss any questions you have with your health care provider. Document Revised: 03/20/2019 Document Reviewed: 03/20/2019 Elsevier Patient Education  2022 [employer].

## 2021-04-15 NOTE — Progress Notes (Signed)
Electrophysiology Office Note   Date:  04/15/2021   ID:  [name], DOB [birth year], MRN [id number]  PCP:  Theresa Lima, MD  Cardiologist:  Marlou Porch Primary Electrophysiologist:  Theresa Buster Meredith Leeds, MD    Chief Complaint: AF   History of Present Illness: Theresa Harrington is a 65 y.o. female who is being seen today for the evaluation of AF at the request of Theresa Pain, MD. Presenting today for electrophysiology evaluation.  She has a history significant for hypertension, hyperlipidemia, PAD, atrial fibrillation, type 2 diabetes.  She also has severe mitral annular calcification with thickening of the posterior leaflet with immobile edges.  She has mild stenosis and no regurgitation.  When seen most recently in cardiology clinic, she was noted to be in atrial fibrillation.  Her heart rates fluctuate between 70 and 120 bpm.  Today, she denies symptoms of palpitations, chest Harrington, shortness of breath, orthopnea, PND, lower extremity edema, claudication, dizziness, presyncope, syncope, bleeding, or neurologic sequela. The patient is tolerating medications without difficulties.  She potentially has mild shortness of breath and fatigue when she is in atrial fibrillation but she overall feels well.  She is unaware of when she is in atrial fibrillation.  Despite that, her rates in atrial fibrillation are at times rapid.  She would like to stay in normal rhythm.   Past Medical History:  Diagnosis Date   Allergy    Anxiety    Pt. denies   Arthritis    right index figer   Asymptomatic cholelithiasis    Atherosclerosis of aorta (HCC)    Cataract    Clotting disorder (Theresa Harrington)    Gallstones 06/2013   GERD (gastroesophageal reflux disease)    History of kidney stones    lithotrispy   Hyperlipidemia    Hypertension    Hypothyroidism    no meds   Iron deficiency anemia    PAD (peripheral artery disease) (HCC)    PAF (paroxysmal atrial fibrillation) (HCC)    Peripheral arterial occlusive  disease (HCC)    lower extremities   Pneumonia    PONV (postoperative nausea and vomiting)    Right ureteral stone    Type 2 diabetes mellitus (Theresa Harrington)    Type   Vitamin B 12 deficiency    Wears glasses    Past Surgical History:  Procedure Laterality Date   ABDOMINAL AORTOGRAM W/LOWER EXTREMITY N/A 03/09/2020   Procedure: ABDOMINAL AORTOGRAM W/LOWER EXTREMITY;  Surgeon: Serafina Mitchell, MD;  Location: [place] [facility];  Service: Cardiovascular;  Laterality: N/A;   AMPUTATION Left 07/16/2014   Procedure: LEFT SECOND TOE AMPUTATION;  Surgeon: Serafina Mitchell, MD;  Location: [place];  Service: Vascular;  Laterality: Left;  With Nerve block   AUGMENTATION MAMMAPLASTY     BELPHAROPTOSIS REPAIR     eyelid lift   BIOPSY  10/14/2018   Procedure: BIOPSY;  Surgeon: Rush Landmark Telford Nab., MD;  Location: [place];  Service: Gastroenterology;;   BIOPSY  08/04/2019   Procedure: BIOPSY;  Surgeon: Irving Copas., MD;  Location: [place];  Service: Gastroenterology;;   CARDIOVERSION N/A 09/19/2019   Procedure: CARDIOVERSION;  Surgeon: Buford Dresser, MD;  Location: [place];  Service: Cardiovascular;  Laterality: N/A;   COLONOSCOPY     COMBINED AUGMENTATION MAMMAPLASTY AND ABDOMINOPLASTY  2009   W/  BILATERAL  THIGH LIFT   CYSTO/  RIGHT URETERAL STENT PLACEMENT  12-30-2010   CYSTOSCOPY WITH RETROGRADE PYELOGRAM, URETEROSCOPY AND STENT PLACEMENT Right 10/07/2013   Procedure: CYSTOSCOPY  WITH RETROGRADE PYELOGRAM, right URETEROSCOPY AND STENT PLACEMENT, stone extraction;  Surgeon: Arvil Persons, MD;  Location: [facility];  Service: Urology;  Laterality: Right;   CYSTOSCOPY WITH STENT PLACEMENT Right 06/04/2013   Procedure: CYSTOSCOPY WITH STENT PLACEMENT;  Surgeon: Franchot Gallo, MD;  Location: [facility];  Service: Urology;  Laterality: Right;   DILATATION & CURETTAGE/HYSTEROSCOPY WITH MYOSURE N/A 04/27/2015   Procedure: DILATATION & CURETTAGE/HYSTEROSCOPY WITH  MYOSURE;  Surgeon: Terrance Mass, MD;  Location: [place] [facility];  Service: Gynecology;  Laterality: N/A;   ENDARTERECTOMY FEMORAL Left 06/08/2014   Procedure: Left Leg Common Femoral and External Iliac  Endartarectomy with patch Angioplasty;  Surgeon: Rosetta Posner, MD;  Location[facility];  Service: Vascular;  Laterality: Left;   ESOPHAGOGASTRODUODENOSCOPY N/A 10/14/2018   Procedure: ESOPHAGOGASTRODUODENOSCOPY (EGD);  Surgeon: Irving Copas., MD;  Location: [place];  Service: Gastroenterology;  Laterality: N/A;   ESOPHAGOGASTRODUODENOSCOPY (EGD) WITH PROPOFOL N/A 11/06/2018   Procedure: ESOPHAGOGASTRODUODENOSCOPY (EGD) WITH PROPOFOL;  Surgeon: Rush Landmark Telford Nab., MD;  [facility];  Service: Gastroenterology;  Laterality: N/A;  RFA   ESOPHAGOGASTRODUODENOSCOPY (EGD) WITH PROPOFOL N/A 02/12/2019   Procedure: ESOPHAGOGASTRODUODENOSCOPY (EGD) WITH PROPOFOL;  Surgeon: Rush Landmark Telford Nab., MD;  [facility];  Service: Gastroenterology;  Laterality: N/A;   ESOPHAGOGASTRODUODENOSCOPY (EGD) WITH PROPOFOL N/A 08/04/2019   Procedure: ESOPHAGOGASTRODUODENOSCOPY (EGD) WITH PROPOFOL;  Surgeon: Rush Landmark Telford Nab., MD;  Location: [place];  Service: Gastroenterology;  Laterality: N/A;  WITH RFA   EXTRACORPOREAL SHOCK WAVE LITHOTRIPSY Right 08-04-2013//   06-23-2013//   01-16-2011   EYE SURGERY Bilateral    cataract   FEMORAL-POPLITEAL BYPASS GRAFT Left 06/08/2014   Procedure: Left Leg Femoral -Popliteal Bypass Graft;  Surgeon: Rosetta Posner, MD;  Location: [facility];  Service: Vascular;  Laterality: Left;   FEMORAL-POPLITEAL BYPASS GRAFT Left 06/09/2014   Procedure: Left Femoral and Popliteal Exposure; Left Femoral to Anterior Tibial Bypass Graft using Propaten 65m by 80cm Goretex Graft; Left Tibial Endarterectomy; Left Femoraland Popliteal Thrombectomy ;  Surgeon: VSerafina Mitchell MD;  [place]  Service: Vascular;  Laterality: Left;   GI RADIOFREQUENCY ABLATION N/A 11/06/2018    Procedure: GI RADIOFREQUENCY ABLATION;  Surgeon: MIrving Copas, MD;  [facility];  Service: Gastroenterology;  Laterality: N/A;   GI RADIOFREQUENCY ABLATION N/A 02/12/2019   Procedure: GI RADIOFREQUENCY ABLATION;  Surgeon: MRush LandmarkGTelford Nab, MD;  [facility];  Service: Gastroenterology;  Laterality: N/A;   GI RADIOFREQUENCY ABLATION N/A 08/04/2019   Procedure: GI RADIOFREQUENCY ABLATION;  Surgeon: MRush LandmarkGTelford Nab, MD;  [place]  Service: Gastroenterology;  Laterality: N/A;   HOLMIUM LASER APPLICATION Right 60/07/8887  Procedure: HOLMIUM LASER APPLICATION;  Surgeon: MArvil Persons MD;  [facility]  Service: Urology;  Laterality: Right;   KIDNEY STONE SURGERY  April 2015   1-2 stones   LAPAROSCOPIC GASTRIC BANDING  05-29-2005   LITHOTRIPSY  2-3 times   LOWER EXTREMITY ANGIOGRAM N/A 06/04/2014   Procedure: LOWER EXTREMITY ANGIOGRAM;  Surgeon: VSerafina Mitchell MD;  [facility];  Service: Cardiovascular;  Laterality: N/A;   ORIF FIFTH METACARPAL FX  RIGHT HAND  04-21-2002   REVISION AND RE-SITING LAP-BAND PORT  04-08-2010   W/  UPPER EGD   RIGHT KNEE PATELLECTOMY W/ REPAIR OF EXTENSOR MECHANISM  04-14-2002   TEE WITHOUT CARDIOVERSION N/A 12/24/2019   Procedure: TRANSESOPHAGEAL ECHOCARDIOGRAM (TEE);  Surgeon: TSueanne Margarita MD;  [place]  Service: Cardiovascular;  Laterality: N/A;   Toenail removed Left Jan. 21, 2016   2nd toenail-  Dr. Barkley Bruns     Current Outpatient Medications  Medication Sig Dispense Refill   Blood Glucose Monitoring Suppl (ONETOUCH VERIO IQ SYSTEM) w/Device KIT USE TO CHECK SUGAR DAILY 1 kit 0   cetirizine (ZYRTEC) 10 MG tablet Take 10 mg by mouth daily as needed for allergies.     diltiazem (CARDIZEM CD) 120 MG 24 hr capsule Take 1 capsule by mouth once daily 90 capsule 0   dorzolamide-timolol (COSOPT) 22.3-6.8 MG/ML ophthalmic solution Place 1 drop into the right eye  2 (two) times daily.      Empagliflozin-metFORMIN HCl ER (SYNJARDY XR) 12.08-998 MG TB24 Take 1 tablet by mouth daily. Via [employer] patient assistance 90 tablet 1   ezetimibe (ZETIA) 10 MG tablet TAKE ONE TABLET BY MOUTH EVERYDAY AT BEDTIME 90 tablet 1   Glucagon (GVOKE HYPOPEN 2-PACK) 1 MG/0.2ML SOAJ Inject 1 Act into the skin daily as needed. 2 mL 5   Insulin Glargine (BASAGLAR KWIKPEN) 100 UNIT/ML Inject 50 Units into the skin at bedtime. Via [employer] patient assistance 30 mL 2   insulin lispro (HUMALOG) 100 UNIT/ML injection Inject 14-20 Units into the skin 3 (three) times daily before meals. [place] patient assistance     Insulin Pen Needle (PEN NEEDLES) 32G X 4 MM MISC Inject 1 pen as directed 4 (four) times daily. Use to inject levemir or novolog as directed. 400 each 3   latanoprost (XALATAN) 0.005 % ophthalmic solution Place 1 drop into both eyes at bedtime.      losartan (COZAAR) 100 MG tablet TAKE ONE TABLET BY MOUTH EVERYDAY AT BEDTIME 90 tablet 1   magnesium oxide (MAG-OX) 400 MG tablet TAKE ONE TABLET BY MOUTH EVERYDAY AT BEDTIME 90 tablet 1   nebivolol (BYSTOLIC) 5 MG tablet Take 1 tablet (5 mg total) by mouth daily. 90 tablet 1   omeprazole (PRILOSEC) 40 MG capsule TAKE ONE CAPSULE BY MOUTH EVERY MORNING and TAKE ONE CAPSULE BY MOUTH EVERY EVENING 180 capsule 1   ONETOUCH VERIO test strip Use to check blood sugar twice daily. 200 strip 3   potassium chloride SA (KLOR-CON) 20 MEQ tablet TAKE ONE TABLET BY MOUTH EVERY MORNING 90 tablet 1   pravastatin (PRAVACHOL) 40 MG tablet TAKE ONE TABLET BY MOUTH EVERYDAY AT BEDTIME 90 tablet 1   rivaroxaban (XARELTO) 20 MG TABS tablet Take 1 tablet (20 mg total) by mouth daily. 90 tablet 1   Semaglutide, 1 MG/DOSE, (OZEMPIC, 1 MG/DOSE,) 4 MG/3ML SOPN Inject 1 mg into the skin once a week. Via [employer] patient assistance 9 mL 1   thiamine 100 MG tablet Take 1 tablet (100 mg total) by mouth every other day. 45 tablet 1   No current  facility-administered medications for this visit.    Allergies:   Amlodipine, Atorvastatin, Ampicillin, Codeine, Lisinopril, Penicillins, and Januvia [sitagliptin]   Social History:  The patient  reports that she quit smoking about 19 years ago. Her smoking use included cigarettes. She has a 28.00 pack-year smoking history. She has never used smokeless tobacco. She reports that she does not drink alcohol and does not use drugs.   Family History:  The patient's family history includes Arthritis in an other family member; Cancer in her brother and another family member; Colon cancer in her maternal aunt; Deep vein thrombosis in her father and sister; Diabetes in her brother, brother, brother, father, and sister; Heart disease  in her brother, father, and sister; Hyperlipidemia in her brother, father, mother, and sister; Hypertension in an other family member; Kidney disease in her mother; Other in her mother; Stroke in an other family member.    ROS:  Please see the history of present illness.   Otherwise, review of systems is positive for none.   All other systems are reviewed and negative.    PHYSICAL EXAM: VS:  BP 130/70    Pulse (!) 58    Ht '5\' 4"'  (1.626 m)    Wt 168 lb 6.4 oz (76.4 kg)    LMP  (LMP Unknown)    SpO2 98%    BMI 28.91 kg/m  , BMI Body mass index is 28.91 kg/m. GEN: Well nourished, well developed, in no acute distress  HEENT: normal  Neck: no JVD, carotid bruits, or masses Cardiac: RRR; no murmurs, rubs, or gallops,no edema  Respiratory:  clear to auscultation bilaterally, normal work of breathing GI: soft, nontender, nondistended, + BS MS: no deformity or atrophy  Skin: warm and dry Neuro:  Strength and sensation are intact Psych: euthymic mood, full affect  EKG:  EKG is ordered today. Personal review of the ekg ordered shows sinus rhythm, rate 58  Recent Labs: 12/30/2020: ALT 13; BUN 11; Creatinine, Ser 0.76; Hemoglobin 14.9; Platelets 217.0; Potassium 4.0; Sodium 141;  TSH 0.54    Lipid Panel     Component Value Date/Time   CHOL 171 12/30/2020 1046   TRIG 130.0 12/30/2020 1046   HDL 55.70 12/30/2020 1046   CHOLHDL 3 12/30/2020 1046   VLDL 26.0 12/30/2020 1046   LDLCALC 89 12/30/2020 1046   LDLDIRECT 160.2 03/01/2010 1001     Wt Readings from Last 3 Encounters:  04/15/21 168 lb 6.4 oz (76.4 kg)  02/01/21 174 lb 1.6 oz (79 kg)  01/21/21 166 lb 6.4 oz (75.5 kg)      Other studies Reviewed: Additional studies/ records that were reviewed today include: TTE 10/07/20  Review of the above records today demonstrates:   1. Left ventricular ejection fraction, by estimation, is 65 to 70%. The  left ventricle has normal function. The left ventricle has no regional  wall motion abnormalities. There is mild left ventricular hypertrophy.  Left ventricular diastolic parameters  are consistent with Grade II diastolic dysfunction (pseudonormalization).  Elevated left atrial pressure.   2. Right ventricular systolic function is normal. The right ventricular  size is normal. Tricuspid regurgitation signal is inadequate for assessing  PA pressure.   3. Left atrial size was severely dilated.   4. Right atrial size was mildly dilated.   5. The mitral valve is abnormal. Severe mitral annular calcification. No  evidence of mitral valve regurgitation. Mild to moderate mitral stenosis.  MG 77mHg, MVA 1.4 cm^2 by continuity equation   6. The aortic valve is tricuspid. Aortic valve regurgitation is not  visualized. Mild to moderate aortic valve sclerosis/calcification is  present, without any evidence of aortic stenosis.   7. The inferior vena cava is normal in size with greater than 50%  respiratory variability, suggesting right atrial pressure of 3 mmHg.    ASSESSMENT AND PLAN:  1.  Persistent atrial fibrillation: Left atrium severely dilated.  Currently on diltiazem 1096mg daily, Bystolic 5 mg daily, Xarelto 20 mg daily.  CHA2DS2-VASc of 3.  She is continue to  have episodes of atrial fibrillation.  Despite minimal symptoms, she would like to stay in normal rhythm.  She would like to avoid antiarrhythmic medications.  Due to that we Saryah Loper plan for ablation.  Risk, benefits, and alternatives to EP study and radiofrequency ablation for afib were also discussed in detail today. These risks include but are not limited to stroke, bleeding, vascular damage, tamponade, perforation, damage to the esophagus, lungs, and other structures, pulmonary vein stenosis, worsening renal function, and death. The patient understands these risk and wishes to proceed.  We Konya Fauble therefore proceed with catheter ablation at the next available time.  Carto, ICE, anesthesia are requested for the procedure.  Theodus Ran also obtain CT PV protocol prior to the procedure to exclude LAA thrombus and further evaluate atrial anatomy.   2.  Mitral valve stenosis: Mild on most recent echo.  Plan per primary cardiology.  3.  Hypertension: Currently well controlled  Case discussed with primary cardiology  Current medicines are reviewed at length with the patient today.   The patient does not have concerns regarding her medicines.  The following changes were made today:  none  Labs/ tests ordered today include:  Orders Placed This Encounter  Procedures   CT CARDIAC MORPH/PULM VEIN W/CM&W/O CA SCORE   Basic metabolic panel   CBC   EKG 12-Lead     Disposition:   FU with Naim Murtha 3 months  Signed, Jobin Montelongo Meredith Leeds, MD  04/15/2021 11:56 AM     [facility] [address] [place] [address] [phone] (office) [phone] (fax)

## 2021-04-26 ENCOUNTER — Telehealth: Payer: Self-pay

## 2021-04-26 NOTE — Telephone Encounter (Signed)
Spoke with patient  [employer] PAP placed out front for patient to sign   [employer] income verification refaxed to company

## 2021-05-17 ENCOUNTER — Telehealth: Payer: Self-pay | Admitting: Cardiology

## 2021-05-17 ENCOUNTER — Telehealth: Payer: Self-pay | Admitting: Internal Medicine

## 2021-05-17 NOTE — Telephone Encounter (Signed)
PT would like a call back regarding a question about her medication. Phone number to reach her is : [phone].

## 2021-05-17 NOTE — Telephone Encounter (Signed)
° °  Pt would like to r/s her ablation

## 2021-05-17 NOTE — Telephone Encounter (Signed)
Called and left message  Patient to return call to further discuss

## 2021-05-18 NOTE — Telephone Encounter (Signed)
Pt would like to r/s ablation to 4/04. Aware I will be in touch to go over updated instructions, etc. Patient verbalized understanding and agreeable to plan.

## 2021-05-19 ENCOUNTER — Telehealth: Payer: Self-pay

## 2021-05-19 NOTE — Telephone Encounter (Signed)
Patient calling   Requesting update on her patient assistance   Theresa Harrington was rejected due to patient's income - patient is going to call company about the income limit  [employer] PAP was refaxed per request from [place] PAP application - patient has been approved until 74/03/8785  Bystolic PAP approved   Left message for patient to return call about ozempic PAP status

## 2021-05-26 ENCOUNTER — Encounter: Payer: Self-pay | Admitting: Cardiology

## 2021-05-26 ENCOUNTER — Ambulatory Visit: Payer: PPO | Admitting: Nurse Practitioner

## 2021-05-26 ENCOUNTER — Encounter: Payer: Self-pay | Admitting: *Deleted

## 2021-05-26 ENCOUNTER — Ambulatory Visit: Payer: PPO | Admitting: Cardiology

## 2021-05-26 ENCOUNTER — Other Ambulatory Visit: Payer: Self-pay

## 2021-05-26 DIAGNOSIS — I4819 Other persistent atrial fibrillation: Secondary | ICD-10-CM

## 2021-05-26 DIAGNOSIS — Z7901 Long term (current) use of anticoagulants: Secondary | ICD-10-CM | POA: Diagnosis not present

## 2021-05-26 MED ORDER — NEBIVOLOL HCL 10 MG PO TABS: 10.0000 mg | ORAL_TABLET | Freq: Every day | ORAL | 3 refills | Status: DC

## 2021-05-26 NOTE — Progress Notes (Signed)
Cardiology Office Note:    Date:  05/26/2021   ID:  Theresa Harrington, DOB 07/08/1955, MRN [id number]  PCP:  [name], MD   [facility] Providers Cardiologist:  [name], MD Electrophysiologist:  Will Meredith Leeds, MD     Referring MD: [name], MD   History of Present Illness:    Theresa Harrington is a 66 y.o. female here for the follow-up of irregular heartbeat.  At this time she is scheduled for atrial fibrillation ablation on 08/02/2021.  Previously seen for the follow up of atrial fibrillation with RVR per Dr. Scarlette Calico.   Severe mitral annular calcification of mitral valve with thickening of the posterior mitral valve leaflet with edges immobile.  There is very mild stenosis.  3 mmHg gradient.  No mitral regurgitation.   When she was seen by Dr. [name] (01/21/2021) she was noted to be back in atrial fibrillation. She was not able to feel when she returned to Afib. When she was in Afib her heart rate would fluctuate such as from 120 bpm, to 70, and then to 99 bpm.   At her last appointment her at home blood pressure was generally stable. She had not been taking losartan because her blood pressure was low. However, she did take it a few times because her blood pressure seemed to be gradually increasing again.  Today: Overall, she is feeling okay and looking forward to her scheduled ablation.   Initially, she was feeling "zapped" and fatigued. She notes this suddenly occurred while eating lunch last Thursday. Her heart rate has been as high as the 150s in the past week.   She remains compliant and denies missing any doses of anticoagulation.  She denies any chest pain, or shortness of breath. No lightheadedness, headaches, syncope, orthopnea, PND, or lower extremity edema.   Past Medical History:  Diagnosis Date   Allergy    Anxiety    Pt. denies   Arthritis    right index figer   Asymptomatic cholelithiasis    Atherosclerosis of aorta (HCC)    Cataract     Clotting disorder ([place])    Gallstones 06/2013   GERD (gastroesophageal reflux disease)    History of kidney stones    lithotrispy   Hyperlipidemia    Hypertension    Hypothyroidism    no meds   Iron deficiency anemia    PAD (peripheral artery disease) (HCC)    PAF (paroxysmal atrial fibrillation) (HCC)    Peripheral arterial occlusive disease (HCC)    lower extremities   Pneumonia    PONV (postoperative nausea and vomiting)    Right ureteral stone    Type 2 diabetes mellitus ([place])    Type   Vitamin B 12 deficiency    Wears glasses     Past Surgical History:  Procedure Laterality Date   ABDOMINAL AORTOGRAM W/LOWER EXTREMITY N/A 03/09/2020   Procedure: ABDOMINAL AORTOGRAM W/LOWER EXTREMITY;  Surgeon: Serafina Mitchell, MD;  Location: [place] [facility];  Service: Cardiovascular;  Laterality: N/A;   AMPUTATION Left 07/16/2014   Procedure: LEFT SECOND TOE AMPUTATION;  Surgeon: Serafina Mitchell, MD;  Location: [place];  Service: Vascular;  Laterality: Left;  With Nerve block   AUGMENTATION MAMMAPLASTY     BELPHAROPTOSIS REPAIR     eyelid lift   BIOPSY  10/14/2018   Procedure: BIOPSY;  Surgeon: Rush Landmark Telford Nab., MD;  Location: [place];  Service: Gastroenterology;;   BIOPSY  08/04/2019   Procedure: BIOPSY;  Surgeon: Irving Copas., MD;  Location: [place];  Service: Gastroenterology;;   CARDIOVERSION N/A 09/19/2019   Procedure: CARDIOVERSION;  Surgeon: Buford Dresser, MD;  Location: [place];  Service: Cardiovascular;  Laterality: N/A;   COLONOSCOPY     COMBINED AUGMENTATION MAMMAPLASTY AND ABDOMINOPLASTY  2009   W/  BILATERAL  THIGH LIFT   CYSTO/  RIGHT URETERAL STENT PLACEMENT  12-30-2010   CYSTOSCOPY WITH RETROGRADE PYELOGRAM, URETEROSCOPY AND STENT PLACEMENT Right 10/07/2013   Procedure: CYSTOSCOPY WITH RETROGRADE PYELOGRAM, right URETEROSCOPY AND STENT PLACEMENT, stone extraction;  Surgeon: Arvil Persons, MD;  [facility];   Service: Urology;  Laterality: Right;   CYSTOSCOPY WITH STENT PLACEMENT Right 06/04/2013   Procedure: CYSTOSCOPY WITH STENT PLACEMENT;  Surgeon: Franchot Gallo, MD;  Location: [facility];  Service: Urology;  Laterality: Right;   DILATATION & CURETTAGE/HYSTEROSCOPY WITH MYOSURE N/A 04/27/2015   Procedure: DILATATION & CURETTAGE/HYSTEROSCOPY WITH MYOSURE;  Surgeon: Terrance Mass, MD;  Location: [place] [facility];  Service: Gynecology;  Laterality: N/A;   ENDARTERECTOMY FEMORAL Left 06/08/2014   Procedure: Left Leg Common Femoral and External Iliac  Endartarectomy with patch Angioplasty;  Surgeon: Rosetta Posner, MD;  Location: [facility];  Service: Vascular;  Laterality: Left;   ESOPHAGOGASTRODUODENOSCOPY N/A 10/14/2018   Procedure: ESOPHAGOGASTRODUODENOSCOPY (EGD);  Surgeon: Irving Copas., MD;  Location: [place];  Service: Gastroenterology;  Laterality: N/A;   ESOPHAGOGASTRODUODENOSCOPY (EGD) WITH PROPOFOL N/A 11/06/2018   Procedure: ESOPHAGOGASTRODUODENOSCOPY (EGD) WITH PROPOFOL;  Surgeon: Rush Landmark Telford Nab., MD;  [facility];  Service: Gastroenterology;  Laterality: N/A;  RFA   ESOPHAGOGASTRODUODENOSCOPY (EGD) WITH PROPOFOL N/A 02/12/2019   Procedure: ESOPHAGOGASTRODUODENOSCOPY (EGD) WITH PROPOFOL;  Surgeon: Rush Landmark Telford Nab., MD;  [facility];  Service: Gastroenterology;  Laterality: N/A;   ESOPHAGOGASTRODUODENOSCOPY (EGD) WITH PROPOFOL N/A 08/04/2019   Procedure: ESOPHAGOGASTRODUODENOSCOPY (EGD) WITH PROPOFOL;  Surgeon: Rush Landmark Telford Nab., MD;  Location: [place];  Service: Gastroenterology;  Laterality: N/A;  WITH RFA   EXTRACORPOREAL SHOCK WAVE LITHOTRIPSY Right 08-04-2013//   06-23-2013//   01-16-2011   EYE SURGERY Bilateral    cataract   FEMORAL-POPLITEAL BYPASS GRAFT Left 06/08/2014   Procedure: Left Leg Femoral -Popliteal Bypass Graft;  Surgeon: Rosetta Posner, MD;  Location: [facility];  Service: Vascular;  Laterality: Left;   FEMORAL-POPLITEAL BYPASS GRAFT Left  06/09/2014   Procedure: Left Femoral and Popliteal Exposure; Left Femoral to Anterior Tibial Bypass Graft using Propaten 78m by 80cm Goretex Graft; Left Tibial Endarterectomy; Left Femoraland Popliteal Thrombectomy ;  Surgeon: VSerafina Mitchell MD;  [place]  Service: Vascular;  Laterality: Left;   GI RADIOFREQUENCY ABLATION N/A 11/06/2018   Procedure: GI RADIOFREQUENCY ABLATION;  Surgeon: MIrving Copas, MD;  [facility];  Service: Gastroenterology;  Laterality: N/A;   GI RADIOFREQUENCY ABLATION N/A 02/12/2019   Procedure: GI RADIOFREQUENCY ABLATION;  Surgeon: MRush LandmarkGTelford Nab, MD;  Location[facility];  Service: Gastroenterology;  Laterality: N/A;   GI RADIOFREQUENCY ABLATION N/A 08/04/2019   Procedure: GI RADIOFREQUENCY ABLATION;  Surgeon: MRush LandmarkGTelford Nab, MD;  [place]  Service: Gastroenterology;  Laterality: N/A;   HOLMIUM LASER APPLICATION Right 69/09/2227  Procedure: HOLMIUM LASER APPLICATION;  Surgeon: MArvil Persons MD;  [facility]  Service: Urology;  Laterality: Right;   KIDNEY STONE SURGERY  April 2015   1-2 stones   LAPAROSCOPIC GASTRIC BANDING  05-29-2005   LITHOTRIPSY  2-3 times   LOWER EXTREMITY ANGIOGRAM N/A 06/04/2014   Procedure: LOWER  EXTREMITY ANGIOGRAM;  Surgeon: Serafina Mitchell, MD;  Location: [facility];  Service: Cardiovascular;  Laterality: N/A;   ORIF FIFTH METACARPAL FX  RIGHT HAND  04-21-2002   REVISION AND RE-SITING LAP-BAND PORT  04-08-2010   W/  UPPER EGD   RIGHT KNEE PATELLECTOMY W/ REPAIR OF EXTENSOR MECHANISM  04-14-2002   TEE WITHOUT CARDIOVERSION N/A 12/24/2019   Procedure: TRANSESOPHAGEAL ECHOCARDIOGRAM (TEE);  Surgeon: Sueanne Margarita, MD;  Location: [facility];  Service: Cardiovascular;  Laterality: N/A;   Toenail removed Left Jan. 21, 2016   2nd toenail-  Dr. Barkley Bruns    Current Medications: Current Meds  Medication Sig   Blood Glucose Monitoring Suppl (ONETOUCH VERIO IQ  SYSTEM) w/Device KIT USE TO CHECK SUGAR DAILY   cetirizine (ZYRTEC) 10 MG tablet Take 10 mg by mouth daily as needed for allergies.   dorzolamide-timolol (COSOPT) 22.3-6.8 MG/ML ophthalmic solution Place 1 drop into the right eye 2 (two) times daily.    Empagliflozin-metFORMIN HCl ER (SYNJARDY XR) 12.08-998 MG TB24 Take 1 tablet by mouth daily. Via [employer] patient assistance   ezetimibe (ZETIA) 10 MG tablet TAKE ONE TABLET BY MOUTH EVERYDAY AT BEDTIME   Glucagon (GVOKE HYPOPEN 2-PACK) 1 MG/0.2ML SOAJ Inject 1 Act into the skin daily as needed.   Insulin Glargine (BASAGLAR KWIKPEN) 100 UNIT/ML Inject 50 Units into the skin at bedtime. [place] patient assistance   insulin lispro (HUMALOG) 100 UNIT/ML injection Inject 14-20 Units into the skin 3 (three) times daily before meals. [place] patient assistance   Insulin Pen Needle (PEN NEEDLES) 32G X 4 MM MISC Inject 1 pen as directed 4 (four) times daily. Use to inject levemir or novolog as directed.   latanoprost (XALATAN) 0.005 % ophthalmic solution Place 1 drop into both eyes at bedtime.    losartan (COZAAR) 100 MG tablet TAKE ONE TABLET BY MOUTH EVERYDAY AT BEDTIME   magnesium oxide (MAG-OX) 400 MG tablet TAKE ONE TABLET BY MOUTH EVERYDAY AT BEDTIME   omeprazole (PRILOSEC) 40 MG capsule TAKE ONE CAPSULE BY MOUTH EVERY MORNING and TAKE ONE CAPSULE BY MOUTH EVERY EVENING   ONETOUCH VERIO test strip Use to check blood sugar twice daily.   potassium chloride SA (KLOR-CON) 20 MEQ tablet TAKE ONE TABLET BY MOUTH EVERY MORNING   pravastatin (PRAVACHOL) 40 MG tablet TAKE ONE TABLET BY MOUTH EVERYDAY AT BEDTIME   rivaroxaban (XARELTO) 20 MG TABS tablet Take 1 tablet (20 mg total) by mouth daily.   Semaglutide, 1 MG/DOSE, (OZEMPIC, 1 MG/DOSE,) 4 MG/3ML SOPN Inject 1 mg into the skin once a week. Via [employer] patient assistance   thiamine 100 MG tablet Take 1 tablet (100 mg total) by mouth every other day.   [DISCONTINUED] nebivolol  (BYSTOLIC) 5 MG tablet Take 1 tablet (5 mg total) by mouth daily.     Allergies:   Amlodipine, Atorvastatin, Ampicillin, Codeine, Lisinopril, Penicillins, and Januvia [sitagliptin]   Social History   Socioeconomic History   Marital status: Single    Spouse name: Not on file   Number of children: Not on file   Years of education: Not on file   Highest education level: Not on file  Occupational History   Occupation: Quarry manager: [employer]  Tobacco Use   Smoking status: Former    Packs/day: 1.00    Years: 28.00    Pack years: 28.00    Types: Cigarettes    Quit date: 09/05/2001    Years  since quitting: 19.7   Smokeless tobacco: Never  Vaping Use   Vaping Use: Never used  Substance and Sexual Activity   Alcohol use: No    Alcohol/week: 0.0 standard drinks   Drug use: No   Sexual activity: Yes  Other Topics Concern   Not on file  Social History Narrative   Regular exercise- yes   Social Determinants of Health   Financial Resource Strain: Low Risk    Difficulty of Paying Living Expenses: Not hard at all  Food Insecurity: No Food Insecurity   Worried About Charity fundraiser in the Last Year: Never true   Ran Out of Food in the Last Year: Never true  Transportation Needs: No Transportation Needs   Lack of Transportation (Medical): No   Lack of Transportation (Non-Medical): No  Physical Activity: Inactive   Days of Exercise per Week: 0 days   Minutes of Exercise per Session: 0 min  Stress: No Stress Concern Present   Feeling of Stress : Not at all  Social Connections: Moderately Integrated   Frequency of Communication with Friends and Family: More than three times a week   Frequency of Social Gatherings with Friends and Family: More than three times a week   Attends Religious Services: More than 4 times per year   Active Member of [employer] or Organizations: Yes   Attends Music therapist: More than 4 times per year   Marital Status: Never  married     Family History: The patient's family history includes Arthritis in an other family member; Cancer in her brother and another family member; Colon cancer in her maternal aunt; Deep vein thrombosis in her father and sister; Diabetes in her brother, brother, brother, father, and sister; Heart disease in her brother, father, and sister; Hyperlipidemia in her brother, father, mother, and sister; Hypertension in an other family member; Kidney disease in her mother; Other in her mother; Stroke in an other family member. There is no history of Esophageal cancer, Stomach cancer, Inflammatory bowel disease, Liver disease, or Pancreatic cancer.  ROS:   Please see the history of present illness.    (+) Fatigue All other systems reviewed and are negative.  EKGs/Labs/Other Studies Reviewed:    The following studies were reviewed today:  Carotid Duplex 10/25/2020: Summary:  Right Carotid: Velocities in the right ICA are consistent with a 1-39%  stenosis.   Left Carotid: Velocities in the left ICA are consistent with a 1-39%  stenosis.   Vertebrals:  Bilateral vertebral arteries demonstrate antegrade flow.  Subclavians: Normal flow hemodynamics were seen in bilateral subclavian arteries.    ABI 10/25/2020: Summary:  Right: Resting right ankle-brachial index indicates moderate right lower  extremity arterial disease. The right toe-brachial index is abnormal.   Left: Resting left ankle-brachial index indicates moderate left lower  extremity arterial disease. The left toe-brachial index is abnormal.    Echo 10/07/2020: 1. Left ventricular ejection fraction, by estimation, is 65 to 70%. The  left ventricle has normal function. The left ventricle has no regional  wall motion abnormalities. There is mild left ventricular hypertrophy.  Left ventricular diastolic parameters  are consistent with Grade II diastolic dysfunction (pseudonormalization).  Elevated left atrial pressure.   2. Right  ventricular systolic function is normal. The right ventricular  size is normal. Tricuspid regurgitation signal is inadequate for assessing  PA pressure.   3. Left atrial size was severely dilated.   4. Right atrial size was mildly dilated.   5.  The mitral valve is abnormal. Severe mitral annular calcification. No  evidence of mitral valve regurgitation. Mild to moderate mitral stenosis.  MG 17mHg, MVA 1.4 cm^2 by continuity equation   6. The aortic valve is tricuspid. Aortic valve regurgitation is not  visualized. Mild to moderate aortic valve sclerosis/calcification is  present, without any evidence of aortic stenosis.   7. The inferior vena cava is normal in size with greater than 50%  respiratory variability, suggesting right atrial pressure of 3 mmHg.    Abdominal aortogram 03/09/2020: Findings:               Aortogram: No significant renal artery stenosis was identified.  The infrarenal abdominal aorta is widely patent.  Bilateral common and external iliac arteries widely patent.               Right Lower Extremity: Diffuse calcific disease within the right common femoral artery with approximate 70% stenosis.  The profundofemoral artery is patent.  The superficial femoral artery is a flush occlusion.  There is reconstitution from geniculate collaterals of the anterior tibial artery which backfills into a small below-knee popliteal artery.  There does appear to be opacification of all 3 tibial vessels however they are very small in caliber               Left Lower Extremity: Left common femoral profundofemoral artery widely patent.  The femoral-popliteal bypass graft is occluded.  The superficial femoral artery is occluded.  There is reconstitution of the anterior tibial artery and opacification of all 3 tibial vessels which are very small in caliber   Intervention: None, a minx was used for closure   Impression:               #1  Occluded left femoral-popliteal bypass graft and left  superficial femoral artery               #2  High-grade calcific stenosis within the right common femoral artery, greater than 70%.  There is a flush occlusion of the right superficial femoral artery with reconstitution of the anterior tibial artery with backfilling of the below-knee popliteal artery.  The tibial vessels are small in caliber.               #3  The patient will be considered for right femoral endarterectomy with femoral below-knee popliteal artery bypass graft   TEE 12/24/2019:  1. Left ventricular ejection fraction, by estimation, is 60 to 65%. The  left ventricle has normal function. The left ventricle has no regional  wall motion abnormalities.   2. Right ventricular systolic function is normal. The right ventricular  size is mildly enlarged.   3. Left atrial size was moderately dilated. No left atrial/left atrial  appendage thrombus was detected.   4. 3D imaging of mitral valve was performed. The mitral valve is  degenerative in appearance. There is severe thickening of the posterior  mitral valve leaflet(s). There is severe calcification of the posterior  mitral valve leaflet(s). Fixed posterior  leaflet of the mitral valve leaflets. Severe mitral annular calcification.  No evidence of mitral valve regurgitation. Very Mild mitral stenosis. The  mean mitral valve gradient is 2.5 mmHg.   5. The aortic valve is normal in structure. Aortic valve regurgitation is  trivial. Mild aortic valve sclerosis is present, with no evidence of  aortic valve stenosis.   6. The inferior vena cava is normal in size with greater than 50%  respiratory variability, suggesting right  atrial pressure of 3 mmHg.   Conclusion(s)/Recommendation(s): Normal biventricular function without  evidence of hemodynamically significant valvular heart disease.   EKG:  EKG is personally reviewed and interpreted. 05/26/2021: Atrial fibrillation. Rate 119 bpm. 02/01/2021: Sinus bradycardia. Rate 56  bpm.  Recent Labs: 12/30/2020: ALT 13; BUN 11; Creatinine, Ser 0.76; Hemoglobin 14.9; Platelets 217.0; Potassium 4.0; Sodium 141; TSH 0.54   Recent Lipid Panel    Component Value Date/Time   CHOL 171 12/30/2020 1046   TRIG 130.0 12/30/2020 1046   HDL 55.70 12/30/2020 1046   CHOLHDL 3 12/30/2020 1046   VLDL 26.0 12/30/2020 1046   LDLCALC 89 12/30/2020 1046   LDLDIRECT 160.2 03/01/2010 1001         Physical Exam:    VS:  BP 110/70 (BP Location: Left Arm, Patient Position: Sitting, Cuff Size: Normal)    Pulse (!) 119    Ht $R'5\' 4"'Ca$  (1.626 m)    Wt 174 lb (78.9 kg)    LMP  (LMP Unknown)    SpO2 97%    BMI 29.87 kg/m     Wt Readings from Last 3 Encounters:  05/26/21 174 lb (78.9 kg)  04/15/21 168 lb 6.4 oz (76.4 kg)  02/01/21 174 lb 1.6 oz (79 kg)     GEN: Well nourished, well developed in no acute distress HEENT: Normal NECK: No JVD; No carotid bruits LYMPHATICS: No lymphadenopathy CARDIAC: Irregularly irregular, soft holosystolic murmur, No rubs, no gallops RESPIRATORY:  Clear to auscultation without rales, wheezing or rhonchi  ABDOMEN: Soft, non-tender, non-distended MUSCULOSKELETAL:  No edema; No deformity  SKIN: Warm and dry NEUROLOGIC:  Alert and oriented x 3 PSYCHIATRIC:  Normal affect   ASSESSMENT:    1. Persistent atrial fibrillation ([place])   2. Chronic anticoagulation     PLAN:    In order of problems listed above:  Atrial fibrillation (HCC) Currently heart rate 119.  We will increase Bystolic to 10 mg from 5.  She is not taking the Cardizem.  Since she has been in atrial fibrillation for which she thinks is 1 week we will go ahead and set her up for a cardioversion.  She has not missed any doses of Xarelto.  Appreciate Dr. Curt Bears, EP.  He has her scheduled for a ablation in April.  Chronic anticoagulation Continuing with Xarelto.  Has not missed any doses.  Prior hemoglobin 14.9 creatinine 0.76.    Shared Decision Making/Informed Consent The risks  (stroke, cardiac arrhythmias rarely resulting in the need for a temporary or permanent pacemaker, skin irritation or burns and complications associated with conscious sedation including aspiration, arrhythmia, respiratory failure and death), benefits (restoration of normal sinus rhythm) and alternatives of a direct current cardioversion were explained in detail to Theresa Harrington and she agrees to proceed.     Follow-up: 6 months.  Medication Adjustments/Labs and Tests Ordered: Current medicines are reviewed at length with the patient today.  Concerns regarding medicines are outlined above.   No orders of the defined types were placed in this encounter.  No orders of the defined types were placed in this encounter.  There are no Patient Instructions on file for this visit.   I,Mathew Stumpf,acting as a Education administrator for [employer], MD.,have documented all relevant documentation on the behalf of [name], MD,as directed by  [name], MD while in the presence of [name], MD.  I, [name], MD, have reviewed all documentation for this visit. The documentation on 05/26/21 for the exam, diagnosis, procedures, and  orders are all accurate and complete.   Signed, [name], MD  05/26/2021 3:37 PM    [facility]

## 2021-05-26 NOTE — Assessment & Plan Note (Signed)
Continuing with Xarelto.  Has not missed any doses.  Prior hemoglobin 14.9 creatinine 0.76.

## 2021-05-26 NOTE — Assessment & Plan Note (Signed)
Currently heart rate 119.  We will increase Bystolic to 10 mg from 5.  She is not taking the Cardizem.  Since she has been in atrial fibrillation for which she thinks is 1 week we will go ahead and set her up for a cardioversion.  She has not missed any doses of Xarelto.  Appreciate Dr. Curt Bears, EP.  He has her scheduled for a ablation in April.

## 2021-05-26 NOTE — Patient Instructions (Signed)
Medication Instructions:  Please increase your Bystolic to 10 mg a day, Continue all other medications as listed.  *If you need a refill on your cardiac medications before your next appointment, please call your pharmacy*  Lab Work: None  If you have labs (blood work) drawn today and your tests are completely normal, you will receive your results only by: [place] (if you have MyChart) OR A paper copy in the mail If you have any lab test that is abnormal or we need to change your treatment, we will call you to review the results.  Testing/Procedures: Your physician has requested that you have a Cardioversion.  Electrical Cardioversion uses a jolt of electricity to your heart either through paddles or wired patches attached to your chest. This is a controlled, usually prescheduled, procedure. This procedure is done at the hospital and you are not awake during the procedure. You usually go home the day of the procedure. Please see the instruction sheet given to you today for more information.  Follow-Up: At [facility], you and your health needs are our priority.  As part of our continuing mission to provide you with exceptional heart care, we have created designated Provider Care Teams.  These Care Teams include your primary Cardiologist (physician) and Advanced Practice Providers (APPs -  Physician Assistants and Nurse Practitioners) who all work together to provide you with the care you need, when you need it.  We recommend signing up for the patient portal called "MyChart".  Sign up information is provided on this After Visit Summary.  MyChart is used to connect with patients for Virtual Visits (Telemedicine).  Patients are able to view lab/test results, encounter notes, upcoming appointments, etc.  Non-urgent messages can be sent to your provider as well.   To learn more about what you can do with MyChart, go to [email or web address].    Your next appointment:   6  month(s)  The format for your next appointment:   In Person  Provider:   [name], MD     Thank you for choosing [facility]!!

## 2021-05-30 ENCOUNTER — Telehealth: Payer: Self-pay | Admitting: Cardiology

## 2021-05-30 NOTE — Telephone Encounter (Signed)
Called pt and left message glad she is feeling better but will leave her on the scheduled for outpt cardioversion for now.  Currently not scheduled until 06/13/21.  Advised to call back to follow up with how she is feeling when closer to time for procedure.

## 2021-05-30 NOTE — Telephone Encounter (Signed)
Patient states she was in the office for afib last week and was set up to get her heart jumped back into rhythm 2/13, but she is not in afib anymore.

## 2021-06-01 ENCOUNTER — Other Ambulatory Visit: Payer: Self-pay | Admitting: Internal Medicine

## 2021-06-01 DIAGNOSIS — I739 Peripheral vascular disease, unspecified: Secondary | ICD-10-CM

## 2021-06-01 DIAGNOSIS — E1151 Type 2 diabetes mellitus with diabetic peripheral angiopathy without gangrene: Secondary | ICD-10-CM

## 2021-06-01 DIAGNOSIS — I7025 Atherosclerosis of native arteries of other extremities with ulceration: Secondary | ICD-10-CM

## 2021-06-02 NOTE — Addendum Note (Signed)
Addended by: Jerline Pain on: 06/02/2021 04:04 PM   Modules accepted: Orders

## 2021-06-03 ENCOUNTER — Encounter (HOSPITAL_COMMUNITY): Payer: Self-pay | Admitting: Cardiovascular Disease

## 2021-06-03 ENCOUNTER — Telehealth: Payer: Self-pay

## 2021-06-03 NOTE — Progress Notes (Signed)
° ° °  Chronic Care Management Pharmacy Assistant   Name: Theresa Harrington  MRN: [id number] DOB: 1955/08/10  Medications: Outpatient Encounter Medications as of 06/03/2021  Medication Sig   Blood Glucose Monitoring Suppl (ONETOUCH VERIO IQ SYSTEM) w/Device KIT USE TO CHECK SUGAR DAILY   cetirizine (ZYRTEC) 10 MG tablet Take 10 mg by mouth daily as needed for allergies.   dorzolamide-timolol (COSOPT) 22.3-6.8 MG/ML ophthalmic solution Place 1 drop into the right eye 2 (two) times daily.    Empagliflozin-metFORMIN HCl ER (SYNJARDY XR) 12.08-998 MG TB24 Take 1 tablet by mouth daily. Via [employer] patient assistance   ezetimibe (ZETIA) 10 MG tablet TAKE ONE TABLET BY MOUTH EVERYDAY AT BEDTIME   Glucagon (GVOKE HYPOPEN 2-PACK) 1 MG/0.2ML SOAJ Inject 1 Act into the skin daily as needed.   Insulin Glargine (BASAGLAR KWIKPEN) 100 UNIT/ML Inject 50 Units into the skin at bedtime. [place] patient assistance   insulin lispro (HUMALOG) 100 UNIT/ML injection Inject 14-20 Units into the skin 3 (three) times daily before meals. [place] patient assistance   Insulin Pen Needle (PEN NEEDLES) 32G X 4 MM MISC Inject 1 pen as directed 4 (four) times daily. Use to inject levemir or novolog as directed.   latanoprost (XALATAN) 0.005 % ophthalmic solution Place 1 drop into both eyes at bedtime.    losartan (COZAAR) 100 MG tablet TAKE ONE TABLET BY MOUTH EVERYDAY AT BEDTIME   magnesium oxide (MAG-OX) 400 (240 Mg) MG tablet TAKE ONE TABLET BY MOUTH EVERYDAY AT BEDTIME   nebivolol (BYSTOLIC) 10 MG tablet Take 1 tablet (10 mg total) by mouth daily.   omeprazole (PRILOSEC) 40 MG capsule TAKE ONE CAPSULE BY MOUTH EVERY MORNING and TAKE ONE CAPSULE BY MOUTH EVERY EVENING   ONETOUCH VERIO test strip Use to check blood sugar twice daily.   potassium chloride SA (KLOR-CON) 20 MEQ tablet TAKE ONE TABLET BY MOUTH EVERY MORNING (Patient not taking: Reported on 05/26/2021)   pravastatin (PRAVACHOL) 40 MG tablet TAKE ONE  TABLET BY MOUTH EVERYDAY AT BEDTIME   rivaroxaban (XARELTO) 20 MG TABS tablet Take 1 tablet (20 mg total) by mouth daily.   Semaglutide, 1 MG/DOSE, (OZEMPIC, 1 MG/DOSE,) 4 MG/3ML SOPN Inject 1 mg into the skin once a week. Via [employer] patient assistance   thiamine 100 MG tablet Take 1 tablet (100 mg total) by mouth every other day.   No facility-administered encounter medications on file as of 06/03/2021.      [facility]  to follow up on patient assistance application for [employer] and Humalog. Per representative at [facility] states patient has been approved starting  05/25/21 through 04/30/22   [facility] Pharmacist Assistant [phone]

## 2021-06-03 NOTE — Progress Notes (Signed)
Attempted to obtain medical history via telephone, unable to reach at this time. I left a voicemail to return pre surgical testing department's phone call.  

## 2021-06-07 ENCOUNTER — Telehealth: Payer: Self-pay | Admitting: Internal Medicine

## 2021-06-07 ENCOUNTER — Other Ambulatory Visit: Payer: Self-pay

## 2021-06-07 DIAGNOSIS — Z794 Long term (current) use of insulin: Secondary | ICD-10-CM

## 2021-06-07 DIAGNOSIS — E1159 Type 2 diabetes mellitus with other circulatory complications: Secondary | ICD-10-CM

## 2021-06-07 MED ORDER — SYNJARDY XR 12.5-1000 MG PO TB24: 1.0000 | ORAL_TABLET | Freq: Every day | ORAL | 1 refills | Status: DC

## 2021-06-07 NOTE — Telephone Encounter (Signed)
1.Medication Requested: Empagliflozin-metFORMIN HCl ER (SYNJARDY XR) 12.08-998 MG TB24  2. Pharmacy (Name, Street, [place]): [facility], [place] [address]  3. On Med List: yes  4. Last Visit with PCP: 01-01-2021  5. Next visit date with PCP: 06-23-2021   Agent: Please be advised that RX refills may take up to 3 business days. We ask that you follow-up with your pharmacy.

## 2021-06-09 NOTE — Telephone Encounter (Signed)
Left message for pt - needs EKG today before we can cancel cardioversion scheduled for 2/13.  Requested she c/b to discuss or sent a MyChart message.

## 2021-06-10 ENCOUNTER — Other Ambulatory Visit: Payer: Self-pay

## 2021-06-10 ENCOUNTER — Ambulatory Visit: Payer: PPO | Admitting: *Deleted

## 2021-06-10 ENCOUNTER — Telehealth: Payer: Self-pay | Admitting: Cardiology

## 2021-06-10 DIAGNOSIS — I4819 Other persistent atrial fibrillation: Secondary | ICD-10-CM

## 2021-06-10 NOTE — Telephone Encounter (Signed)
Patient called stating her heart rhythm, she will not be coming in for Monday's procedure.

## 2021-06-10 NOTE — Telephone Encounter (Signed)
Followed up with pt. Pt aware she will need to come in for EKG to confirm NSR before we would cancel DCCV scheduled for Monday. Pt is at work and gets off at 1 pm and will stop by the office for the EKG.

## 2021-06-10 NOTE — Progress Notes (Signed)
ms1.  Reason for visit: EKG to determine rhythm.  Scheduled for DCCV 2/13  2.  Name of MD requesting visit:  Skains  3. H&P:  see epic  4.  ROS related to problem:    5.  Assessment and plan per MD:    EKG performed  Sinus rhythm HR 57. QT/QTc ms   Pt aware we will call and cancel DCCV scheduled. Pt agreeable to plan.

## 2021-06-13 ENCOUNTER — Encounter (HOSPITAL_COMMUNITY): Admission: RE | Payer: Self-pay | Source: Home / Self Care

## 2021-06-13 ENCOUNTER — Ambulatory Visit (HOSPITAL_COMMUNITY): Admission: RE | Admit: 2021-06-13 | Payer: PPO | Source: Home / Self Care | Admitting: Cardiovascular Disease

## 2021-06-13 SURGERY — CARDIOVERSION
Anesthesia: General

## 2021-06-13 NOTE — Telephone Encounter (Signed)
ms1.  Reason for visit: EKG to determine rhythm.  Scheduled for DCCV 2/13   2.  Name of MD requesting visit:  Skains   3. H&P:  see epic   4.  ROS related to problem:     5.  Assessment and plan per MD:               EKG performed             Sinus rhythm HR 57. QT/QTc ms     Pt aware we will call and cancel DCCV scheduled. Pt agreeable to plan.

## 2021-06-21 ENCOUNTER — Other Ambulatory Visit: Payer: Self-pay | Admitting: Internal Medicine

## 2021-06-21 DIAGNOSIS — Z1231 Encounter for screening mammogram for malignant neoplasm of breast: Secondary | ICD-10-CM

## 2021-06-23 ENCOUNTER — Other Ambulatory Visit: Payer: Self-pay

## 2021-06-23 ENCOUNTER — Ambulatory Visit: Payer: PPO

## 2021-06-23 ENCOUNTER — Encounter: Payer: Self-pay | Admitting: Internal Medicine

## 2021-06-23 ENCOUNTER — Ambulatory Visit (INDEPENDENT_AMBULATORY_CARE_PROVIDER_SITE_OTHER): Payer: PPO | Admitting: Internal Medicine

## 2021-06-23 VITALS — BP 126/72 | HR 57 | Temp 97.7°F | Ht 64.0 in | Wt 167.0 lb

## 2021-06-23 DIAGNOSIS — E042 Nontoxic multinodular goiter: Secondary | ICD-10-CM

## 2021-06-23 DIAGNOSIS — G63 Polyneuropathy in diseases classified elsewhere: Secondary | ICD-10-CM

## 2021-06-23 DIAGNOSIS — Z794 Long term (current) use of insulin: Secondary | ICD-10-CM

## 2021-06-23 DIAGNOSIS — E119 Type 2 diabetes mellitus without complications: Secondary | ICD-10-CM

## 2021-06-23 DIAGNOSIS — E1159 Type 2 diabetes mellitus with other circulatory complications: Secondary | ICD-10-CM

## 2021-06-23 DIAGNOSIS — E538 Deficiency of other specified B group vitamins: Secondary | ICD-10-CM

## 2021-06-23 LAB — CBC WITH DIFFERENTIAL/PLATELET
Basophils Absolute: 0 10*3/uL (ref 0.0–0.1)
Basophils Relative: 0.6 % (ref 0.0–3.0)
Eosinophils Absolute: 0.1 10*3/uL (ref 0.0–0.7)
Eosinophils Relative: 0.7 % (ref 0.0–5.0)
HCT: 43.4 % (ref 36.0–46.0)
Hemoglobin: 14.6 g/dL (ref 12.0–15.0)
Lymphocytes Relative: 30.5 % (ref 12.0–46.0)
Lymphs Abs: 2.6 10*3/uL (ref 0.7–4.0)
MCHC: 33.7 g/dL (ref 30.0–36.0)
MCV: 84.5 fl (ref 78.0–100.0)
Monocytes Absolute: 0.5 10*3/uL (ref 0.1–1.0)
Monocytes Relative: 5.8 % (ref 3.0–12.0)
Neutro Abs: 5.4 10*3/uL (ref 1.4–7.7)
Neutrophils Relative %: 62.4 % (ref 43.0–77.0)
Platelets: 241 10*3/uL (ref 150.0–400.0)
RBC: 5.13 Mil/uL — ABNORMAL HIGH (ref 3.87–5.11)
RDW: 14.4 % (ref 11.5–15.5)
WBC: 8.7 10*3/uL (ref 4.0–10.5)

## 2021-06-23 LAB — BASIC METABOLIC PANEL
BUN: 16 mg/dL (ref 6–23)
CO2: 31 mEq/L (ref 19–32)
Calcium: 9.8 mg/dL (ref 8.4–10.5)
Chloride: 103 mEq/L (ref 96–112)
Creatinine, Ser: 0.84 mg/dL (ref 0.40–1.20)
GFR: 73.48 mL/min (ref >60.00)
Glucose, Bld: 85 mg/dL (ref 70–99)
Potassium: 4.1 mEq/L (ref 3.5–5.1)
Sodium: 139 mEq/L (ref 135–145)

## 2021-06-23 LAB — HEMOGLOBIN A1C: Hgb A1c MFr Bld: 7.7 % — ABNORMAL HIGH (ref 4.6–6.5)

## 2021-06-23 LAB — TSH: TSH: 0.51 u[IU]/mL (ref 0.35–5.50)

## 2021-06-23 MED ORDER — SYNJARDY XR 12.5-1000 MG PO TB24: 1.0000 | ORAL_TABLET | Freq: Every day | ORAL | 1 refills | Status: DC

## 2021-06-23 MED ORDER — CYANOCOBALAMIN 1000 MCG/ML IJ SOLN
1000.0000 ug | Freq: Once | INTRAMUSCULAR | Status: AC
  Administered 2021-06-23: 1000 ug via INTRAMUSCULAR

## 2021-06-23 NOTE — Patient Instructions (Signed)
Type 2 Diabetes Mellitus, Diagnosis, Adult ?Type 2 diabetes (type 2 diabetes mellitus) is a long-term, or chronic, disease. In type 2 diabetes, one or both of these problems may be present: ?The pancreas does not make enough of a hormone called insulin. ?Cells in the body do not respond properly to the insulin that the body makes (insulin resistance). ?Normally, insulin allows blood sugar (glucose) to enter cells in the body. The cells use glucose for energy. Insulin resistance or lack of insulin causes excess glucose to build up in the blood instead of going into cells. This causes high blood glucose (hyperglycemia).  ?What are the causes? ?The exact cause of type 2 diabetes is not known. ?What increases the risk? ?The following factors may make you more likely to develop this condition: ?Having a family member with type 2 diabetes. ?Being overweight or obese. ?Being inactive (sedentary). ?Having been diagnosed with insulin resistance. ?Having a history of prediabetes, diabetes when you were pregnant (gestational diabetes), or polycystic ovary syndrome (PCOS). ?What are the signs or symptoms? ?In the early stage of this condition, you may not have symptoms. Symptoms develop slowly and may include: ?Increased thirst or hunger. ?Increased urination. ?Unexplained weight loss. ?Tiredness (fatigue) or weakness. ?Vision changes, such as blurry vision. ?Dark patches on the skin. ?How is this diagnosed? ?This condition is diagnosed based on your symptoms, your medical history, a physical exam, and your blood glucose level. Your blood glucose may be checked with one or more of the following blood tests: ?A fasting blood glucose (FBG) test. You will not be allowed to eat (you will fast) for 8 hours or longer before a blood sample is taken. ?A random blood glucose test. This test checks blood glucose at any time of day regardless of when you ate. ?An A1C (hemoglobin A1C) blood test. This test provides information about blood  glucose levels over the previous 2-3 months. ?An oral glucose tolerance test (OGTT). This test measures your blood glucose at two times: ?After fasting. This is your baseline blood glucose level. ?Two hours after drinking a beverage that contains glucose. ?You may be diagnosed with type 2 diabetes if: ?Your fasting blood glucose level is 126 mg/dL (7.0 mmol/L) or higher. ?Your random blood glucose level is 200 mg/dL (11.1 mmol/L) or higher. ?Your A1C level is 6.5% or higher. ?Your oral glucose tolerance test result is higher than 200 mg/dL (11.1 mmol/L). ?These blood tests may be repeated to confirm your diagnosis. ?How is this treated? ?Your treatment may be managed by a specialist called an endocrinologist. Type 2 diabetes may be treated by following instructions from your health care provider about: ?Making dietary and lifestyle changes. These may include: ?Following a personalized nutrition plan that is developed by a registered dietitian. ?Exercising regularly. ?Finding ways to manage stress. ?Checking your blood glucose level as often as told. ?Taking diabetes medicines or insulin daily. This helps to keep your blood glucose levels in the healthy range. ?Taking medicines to help prevent complications from diabetes. Medicines may include: ?Aspirin. ?Medicine to lower cholesterol. ?Medicine to control blood pressure. ?Your health care provider will set treatment goals for you. Your goals will be based on your age, other medical conditions you have, and how you respond to diabetes treatment. Generally, the goal of treatment is to maintain the following blood glucose levels: ?Before meals: 80-130 mg/dL (4.4-7.2 mmol/L). ?After meals: below 180 mg/dL (10 mmol/L). ?A1C level: less than 7%. ?Follow these instructions at home: ?Questions to ask your health care provider ?  Consider asking the following questions: ?Should I meet with a certified diabetes care and education specialist? ?What diabetes medicines do I need,  and when should I take them? ?What equipment will I need to manage my diabetes at home? ?How often do I need to check my blood glucose? ?Where can I find a support group for people with diabetes? ?What number can I call if I have questions? ?When is my next appointment? ?General instructions ?Take over-the-counter and prescription medicines only as told by your health care provider. ?Keep all follow-up visits. This is important. ?Where to find more information ?For help and guidance and for more information about diabetes, please visit: ?American Diabetes Association (ADA): www.diabetes.org ?American Association of Diabetes Care and Education Specialists (ADCES): www.diabeteseducator.org ?International Diabetes Federation (IDF): www.idf.org ?Contact a health care provider if: ?Your blood glucose is at or above 240 mg/dL (13.3 mmol/L) for 2 days in a row. ?You have been sick or have had a fever for 2 days or longer, and you are not getting better. ?You have any of the following problems for more than 6 hours: ?You cannot eat or drink. ?You have nausea and vomiting. ?You have diarrhea. ?Get help right away if: ?You have severe hypoglycemia. This means your blood glucose is lower than 54 mg/dL (3.0 mmol/L). ?You become confused or you have trouble thinking clearly. ?You have difficulty breathing. ?You have moderate or large ketone levels in your urine. ?These symptoms may represent a serious problem that is an emergency. Do not wait to see if the symptoms will go away. Get medical help right away. Call your local emergency services (911 in the [place] Do not drive yourself to the hospital. ?Summary ?Type 2 diabetes mellitus is a long-term, or chronic, disease. In type 2 diabetes, the pancreas does not make enough of a hormone called insulin, or cells in the body do not respond properly to insulin that the body makes. ?This condition is treated by making dietary and lifestyle changes and taking diabetes medicines or  insulin. ?Your health care provider will set treatment goals for you. Your goals will be based on your age, other medical conditions you have, and how you respond to diabetes treatment. ?Keep all follow-up visits. This is important. ?This information is not intended to replace advice given to you by your health care provider. Make sure you discuss any questions you have with your health care provider. ?Document Revised: 07/12/2020 Document Reviewed: 07/12/2020 ?Elsevier Patient Education ? 2022 [employer] ? ?

## 2021-06-23 NOTE — Progress Notes (Signed)
Subjective:  Patient ID: Theresa Harrington, female    DOB: 1955-10-30  Age: 66 y.o. MRN: [id number]  CC: Diabetes and Hypothyroidism  This visit occurred during the SARS-CoV-2 public health emergency.  Safety protocols were in place, including screening questions prior to the visit, additional usage of staff PPE, and extensive cleaning of exam room while observing appropriate contact time as indicated for disinfecting solutions.    HPI Theresa Harrington presents for f/up -  She has felt well recently and offers no complaints.  Outpatient Medications Prior to Visit  Medication Sig Dispense Refill   Blood Glucose Monitoring Suppl (ONETOUCH VERIO IQ SYSTEM) w/Device KIT USE TO CHECK SUGAR DAILY 1 kit 0   cetirizine (ZYRTEC) 10 MG tablet Take 10 mg by mouth daily as needed for allergies.     dorzolamide-timolol (COSOPT) 22.3-6.8 MG/ML ophthalmic solution Place 1 drop into the right eye 2 (two) times daily.      ezetimibe (ZETIA) 10 MG tablet TAKE ONE TABLET BY MOUTH EVERYDAY AT BEDTIME 90 tablet 1   Glucagon (GVOKE HYPOPEN 2-PACK) 1 MG/0.2ML SOAJ Inject 1 Act into the skin daily as needed. 2 mL 5   Insulin Glargine (BASAGLAR KWIKPEN) 100 UNIT/ML Inject 50 Units into the skin at bedtime. Via [employer] patient assistance 30 mL 2   insulin lispro (HUMALOG) 100 UNIT/ML injection Inject 14-20 Units into the skin 3 (three) times daily before meals. [employer] patient assistance     Insulin Pen Needle (PEN NEEDLES) 32G X 4 MM MISC Inject 1 pen as directed 4 (four) times daily. Use to inject levemir or novolog as directed. 400 each 3   latanoprost (XALATAN) 0.005 % ophthalmic solution Place 1 drop into both eyes at bedtime.      losartan (COZAAR) 100 MG tablet TAKE ONE TABLET BY MOUTH EVERYDAY AT BEDTIME 90 tablet 1   magnesium oxide (MAG-OX) 400 (240 Mg) MG tablet TAKE ONE TABLET BY MOUTH EVERYDAY AT BEDTIME 90 tablet 1   nebivolol (BYSTOLIC) 10 MG tablet Take 1 tablet (10 mg total) by mouth daily. 90  tablet 3   omeprazole (PRILOSEC) 40 MG capsule TAKE ONE CAPSULE BY MOUTH EVERY MORNING and TAKE ONE CAPSULE BY MOUTH EVERY EVENING 180 capsule 1   ONETOUCH VERIO test strip Use to check blood sugar twice daily. 200 strip 3   potassium chloride SA (KLOR-CON) 20 MEQ tablet TAKE ONE TABLET BY MOUTH EVERY MORNING 90 tablet 1   pravastatin (PRAVACHOL) 40 MG tablet TAKE ONE TABLET BY MOUTH EVERYDAY AT BEDTIME 90 tablet 1   rivaroxaban (XARELTO) 20 MG TABS tablet Take 1 tablet (20 mg total) by mouth daily. 90 tablet 1   Semaglutide, 1 MG/DOSE, (OZEMPIC, 1 MG/DOSE,) 4 MG/3ML SOPN Inject 1 mg into the skin once a week. Via [employer] patient assistance 9 mL 1   thiamine 100 MG tablet Take 1 tablet (100 mg total) by mouth every other day. 45 tablet 1   Empagliflozin-metFORMIN HCl ER (SYNJARDY XR) 12.08-998 MG TB24 Take 1 tablet by mouth daily. Via [facility] Cares patient assistance 90 tablet 1   No facility-administered medications prior to visit.    ROS Review of Systems  Constitutional:  Negative for diaphoresis and fatigue.  HENT: Negative.    Eyes: Negative.   Respiratory:  Negative for cough, chest tightness and wheezing.   Cardiovascular:  Negative for chest pain, palpitations and leg swelling.  Gastrointestinal:  Negative for abdominal pain, constipation, diarrhea and nausea.  Endocrine: Negative.  Genitourinary: Negative.  Negative for difficulty urinating.  Musculoskeletal: Negative.  Negative for arthralgias and myalgias.  Skin: Negative.  Negative for color change.  Neurological:  Negative for dizziness, weakness, light-headedness and headaches.  Hematological:  Negative for adenopathy. Does not bruise/bleed easily.  Psychiatric/Behavioral: Negative.     Objective:  BP 126/72 (BP Location: Left Arm, Patient Position: Sitting, Cuff Size: Large)    Pulse (!) 57    Temp 97.7 F (36.5 C) (Oral)    Ht _0  (1.626 m)    Wt 167 lb (75.8 kg)    LMP  (LMP Unknown)    SpO2 98%    BMI 28.67 kg/m    BP Readings from Last 3 Encounters:  06/23/21 126/72  05/26/21 110/70  04/15/21 130/70    Wt Readings from Last 3 Encounters:  06/23/21 167 lb (75.8 kg)  05/26/21 174 lb (78.9 kg)  04/15/21 168 lb 6.4 oz (76.4 kg)    Physical Exam Vitals reviewed.  Constitutional:      Appearance: Normal appearance.  HENT:     Nose: Nose normal.     Mouth/Throat:     Mouth: Mucous membranes are moist.  Eyes:     General: No scleral icterus.    Conjunctiva/sclera: Conjunctivae normal.  Neck:     Thyroid: No thyroid mass, thyromegaly or thyroid tenderness.  Cardiovascular:     Rate and Rhythm: Normal rate and regular rhythm.     Heart sounds: No murmur heard. Pulmonary:     Effort: Pulmonary effort is normal.     Breath sounds: No stridor. No wheezing, rhonchi or rales.  Abdominal:     General: Abdomen is flat.     Palpations: There is no mass.     Tenderness: There is no abdominal tenderness. There is no guarding.     Hernia: No hernia is present.  Musculoskeletal:        General: No deformity.     Right lower leg: No edema.     Left lower leg: No edema.  Lymphadenopathy:     Cervical: No cervical adenopathy.  Skin:    General: Skin is warm.  Neurological:     General: No focal deficit present.     Mental Status: She is alert.  Psychiatric:        Mood and Affect: Mood normal.        Behavior: Behavior normal.    Lab Results  Component Value Date   WBC 8.7 06/23/2021   HGB 14.6 06/23/2021   HCT 43.4 06/23/2021   PLT 241.0 06/23/2021   GLUCOSE 85 06/23/2021   CHOL 171 12/30/2020   TRIG 130.0 12/30/2020   HDL 55.70 12/30/2020   LDLDIRECT 160.2 03/01/2010   LDLCALC 89 12/30/2020   ALT 13 12/30/2020   AST 15 12/30/2020   NA 139 06/23/2021   K 4.1 06/23/2021   CL 103 06/23/2021   CREATININE 0.84 06/23/2021   BUN 16 06/23/2021   CO2 31 06/23/2021   TSH 0.51 06/23/2021   INR 1.3 (H) 03/08/2020   HGBA1C 7.7 (H) 06/23/2021   MICROALBUR 5.3 (H) 12/30/2020    VAS  [place] ABI WITH/WO TBI  Result Date: 10/25/2020  LOWER EXTREMITY DOPPLER STUDY Patient Name:  Theresa Harrington  Date of Exam:   10/25/2020 Medical Rec #: [id number]   Accession #:    [id number] Date of Birth: Sep 06, 1955  Patient Gender: F Patient Age:   64Y Exam Location:  [facility] Procedure:  VAS [place] ABI WITH/WO TBI Referring Phys: --------------------------------------------------------------------------------  Indications: Ulceration, and peripheral artery disease. High Risk Factors: Hypertension, hyperlipidemia. Other Factors: Right foot ulceration.  Vascular Interventions: Hx occluded left Fem Pop bypass graft.                         Left ATA endarterectomy. Left pop thrombectomy. Left                         CFA, EIA angioplasty.                          Right EIA 50-99% stenosis. Performing Technologist: Ronal Fear RVS, RCS  Examination Guidelines: A complete evaluation includes at minimum, Doppler waveform signals and systolic blood pressure reading at the level of bilateral brachial, anterior tibial, and posterior tibial arteries, when vessel segments are accessible. Bilateral testing is considered an integral part of a complete examination. Photoelectric Plethysmograph (PPG) waveforms and toe systolic pressure readings are included as required and additional duplex testing as needed. Limited examinations for reoccurring indications may be performed as noted.  ABI Findings: +---------+------------------+-----+----------+--------+  Right     Rt Pressure (mmHg) Index Waveform   Comment   +---------+------------------+-----+----------+--------+  Brachial  187                                           +---------+------------------+-----+----------+--------+  PTA       120                0.63  monophasic           +---------+------------------+-----+----------+--------+  DP        91                 0.48  monophasic           +---------+------------------+-----+----------+--------+  Great  Toe 38                 0.20                       +---------+------------------+-----+----------+--------+ +---------+------------------+-----+----------+-------+  Left      Lt Pressure (mmHg) Index Waveform   Comment  +---------+------------------+-----+----------+-------+  Brachial  190                                          +---------+------------------+-----+----------+-------+  PTA       139                0.73  biphasic            +---------+------------------+-----+----------+-------+  DP        111                0.58  monophasic          +---------+------------------+-----+----------+-------+  Great Toe 68                 0.36                      +---------+------------------+-----+----------+-------+ +-------+-----------+-----------+------------+------------+  ABI/TBI Today's ABI Today's TBI Previous ABI Previous TBI  +-------+-----------+-----------+------------+------------+  Right   0.63  0.20        0.75         0.30          +-------+-----------+-----------+------------+------------+  Left    0.73        0.36        0.76         0.46          +-------+-----------+-----------+------------+------------+  Bilateral ABIs appear essentially unchanged compared to prior study on 03/01/2020.  Summary: Right: Resting right ankle-brachial index indicates moderate right lower extremity arterial disease. The right toe-brachial index is abnormal. Left: Resting left ankle-brachial index indicates moderate left lower extremity arterial disease. The left toe-brachial index is abnormal.  *See table(s) above for measurements and observations.  Electronically signed by Harold Barban MD on 10/25/2020 at 5:32:43 PM.    Final    VAS US CAROTID  Result Date: 10/25/2020 Carotid Arterial Duplex Study Patient Name:  Theresa Harrington  Date of Exam:   10/25/2020 Medical Rec #: [id number]   Accession #:    [id number] Date of Birth: 1956-04-06  Patient Gender: F Patient Age:   64Y Exam Location:  [facility]  Procedure:      VAS US CAROTID Referring Phys: [id number] Serafina Mitchell --------------------------------------------------------------------------------  Indications:   Carotid artery disease. Risk Factors:  Hypertension, Diabetes. Other Factors: Lower extremity revascularizations. Performing Technologist: Ronal Fear RVS, RCS  Examination Guidelines: A complete evaluation includes B-mode imaging, spectral Doppler, color Doppler, and power Doppler as needed of all accessible portions of each vessel. Bilateral testing is considered an integral part of a complete examination. Limited examinations for reoccurring indications may be performed as noted.  Right Carotid Findings: +----------+--------+--------+--------+------------------+--------+             PSV cm/s EDV cm/s Stenosis Plaque Description Comments  +----------+--------+--------+--------+------------------+--------+  CCA Prox   84       16                                             +----------+--------+--------+--------+------------------+--------+  CCA Mid    53       18                                             +----------+--------+--------+--------+------------------+--------+  CCA Distal 48       14                heterogenous                 +----------+--------+--------+--------+------------------+--------+  ICA Prox   77       24       1-39%    heterogenous                 +----------+--------+--------+--------+------------------+--------+  ICA Mid    72       23                                             +----------+--------+--------+--------+------------------+--------+  ICA Distal 91       31                                             +----------+--------+--------+--------+------------------+--------+  ECA        329               >50%     heterogenous                 +----------+--------+--------+--------+------------------+--------+ +----------+--------+-------+----------------+-------------------+             PSV cm/s EDV cms Describe          Arm Pressure (mmHG)  +----------+--------+-------+----------------+-------------------+  Subclavian 162              Multiphasic, WNL                      +----------+--------+-------+----------------+-------------------+ +---------+--------+--+--------+--+---------+  Vertebral PSV cm/s 85 EDV cm/s 20 Antegrade  +---------+--------+--+--------+--+---------+  Left Carotid Findings: +----------+--------+--------+--------+------------------+--------+             PSV cm/s EDV cm/s Stenosis Plaque Description Comments  +----------+--------+--------+--------+------------------+--------+  CCA Prox   88       18                                             +----------+--------+--------+--------+------------------+--------+  CCA Mid    67       16                heterogenous                 +----------+--------+--------+--------+------------------+--------+  CCA Distal 58       15                calcific                     +----------+--------+--------+--------+------------------+--------+  ICA Prox   67       15       1-39%    calcific                     +----------+--------+--------+--------+------------------+--------+  ICA Mid    87       28                                             +----------+--------+--------+--------+------------------+--------+  ICA Distal 107      32                                             +----------+--------+--------+--------+------------------+--------+  ECA        163      14                                             +----------+--------+--------+--------+------------------+--------+ +----------+--------+--------+----------------+-------------------+             PSV cm/s EDV cm/s Describe         Arm Pressure (mmHG)  +----------+--------+--------+----------------+-------------------+  Subclavian 101               Multiphasic, WNL                      +----------+--------+--------+----------------+-------------------+ +---------+--------+--+--------+-+---------+  Vertebral PSV  cm/s 62 EDV cm/s 9 Antegrade  +---------+--------+--+--------+-+---------+   Summary: Right Carotid: Velocities in the right ICA are consistent with a 1-39% stenosis. Left Carotid: Velocities in the left ICA are consistent with a 1-39% stenosis. Vertebrals:  Bilateral vertebral arteries demonstrate antegrade flow. Subclavians: Normal flow hemodynamics were seen in bilateral subclavian              arteries. *See table(s) above for measurements and observations.  Electronically signed by Harold Barban MD on 10/25/2020 at 5:28:41 PM.    Final     Assessment & Plan:   Theresa Harrington was seen today for diabetes and hypothyroidism.  Diagnoses and all orders for this visit:  Insulin-requiring or dependent type II diabetes mellitus ([place])- Her blood sugar is adequately well controlled. 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 -     CBC with Differential/Platelet; Future -     cyanocobalamin ((VITAMIN B-12)) injection 1,000 mcg -     CBC with Differential/Platelet  Nontoxic multinodular goiter- Her neck exam is normal and she is euthyroid. -     TSH; Future -     TSH  Type 2 diabetes mellitus with other circulatory complication, with long-term current use of insulin (H[facility]) -     Empagliflozin-metFORMIN HCl ER (SYNJARDY XR) 12.08-998 MG TB24; Take 1 tablet by mouth daily. Via [employer] patient assistance   I am having Theresa Harrington maintain her Pen Needles, latanoprost, OneTouch Verio IQ System, dorzolamide-timolol, thiamine, insulin lispro, cetirizine, potassium chloride SA, Basaglar KwikPen, Ozempic (1 MG/DOSE), [employer] HypoPen 2-Pack, rivaroxaban, OneTouch Verio, losartan, nebivolol, pravastatin, magnesium oxide, ezetimibe, omeprazole, and Synjardy XR. We administered cyanocobalamin.  Meds ordered this encounter  Medications    Empagliflozin-metFORMIN HCl ER (SYNJARDY XR) 12.08-998 MG TB24    Sig: Take 1 tablet by mouth daily. Via [employer] patient assistance    Dispense:  90 tablet    Refill:  1   cyanocobalamin ((VITAMIN B-12)) injection 1,000 mcg     Follow-up: Return in about 6 months (around 12/21/2021).  Scarlette Calico, MD

## 2021-06-26 ENCOUNTER — Other Ambulatory Visit: Payer: Self-pay | Admitting: Internal Medicine

## 2021-06-26 DIAGNOSIS — I10 Essential (primary) hypertension: Secondary | ICD-10-CM

## 2021-06-26 DIAGNOSIS — I4891 Unspecified atrial fibrillation: Secondary | ICD-10-CM

## 2021-06-30 ENCOUNTER — Telehealth: Payer: Self-pay | Admitting: Cardiology

## 2021-06-30 ENCOUNTER — Ambulatory Visit
Admission: RE | Admit: 2021-06-30 | Discharge: 2021-06-30 | Disposition: A | Discharge status: Home / Self Care | Payer: PPO | Source: Ambulatory Visit | Attending: Internal Medicine | Admitting: Internal Medicine

## 2021-06-30 ENCOUNTER — Other Ambulatory Visit: Payer: Self-pay

## 2021-06-30 DIAGNOSIS — S98132A Complete traumatic amputation of one left lesser toe, initial encounter: Secondary | ICD-10-CM | POA: Diagnosis not present

## 2021-06-30 DIAGNOSIS — E1151 Type 2 diabetes mellitus with diabetic peripheral angiopathy without gangrene: Secondary | ICD-10-CM | POA: Diagnosis not present

## 2021-06-30 DIAGNOSIS — Z8631 Personal history of diabetic foot ulcer: Secondary | ICD-10-CM | POA: Diagnosis not present

## 2021-06-30 DIAGNOSIS — E119 Type 2 diabetes mellitus without complications: Secondary | ICD-10-CM | POA: Diagnosis not present

## 2021-06-30 DIAGNOSIS — Z1231 Encounter for screening mammogram for malignant neoplasm of breast: Secondary | ICD-10-CM | POA: Diagnosis not present

## 2021-06-30 NOTE — Telephone Encounter (Signed)
Pt aware that CT would be reaching out to r/s her closer to ablation date (moved from 3/14) of 4/4. ?R/s pre procedure blood work to 3/17. ?Patient verbalized understanding and agreeable to plan.  ? ?

## 2021-06-30 NOTE — Telephone Encounter (Signed)
Patient wants to know why she is coming in for lab work when her procedure isn't schedule until April.  She is wondering if it's to earlier to have lab work done.  ?

## 2021-07-01 ENCOUNTER — Other Ambulatory Visit: Payer: PPO

## 2021-07-06 ENCOUNTER — Ambulatory Visit (HOSPITAL_COMMUNITY): Payer: PPO

## 2021-07-07 ENCOUNTER — Encounter: Payer: Self-pay | Admitting: Gastroenterology

## 2021-07-15 ENCOUNTER — Other Ambulatory Visit: Payer: Self-pay

## 2021-07-15 ENCOUNTER — Other Ambulatory Visit: Payer: PPO | Admitting: *Deleted

## 2021-07-15 DIAGNOSIS — Z01812 Encounter for preprocedural laboratory examination: Secondary | ICD-10-CM

## 2021-07-15 DIAGNOSIS — I4819 Other persistent atrial fibrillation: Secondary | ICD-10-CM | POA: Diagnosis not present

## 2021-07-15 LAB — BASIC METABOLIC PANEL
BUN/Creatinine Ratio: 19 (ref 12–28)
BUN: 14 mg/dL (ref 8–27)
CO2: 25 mmol/L (ref 20–29)
Calcium: 9.3 mg/dL (ref 8.7–10.3)
Chloride: 102 mmol/L (ref 96–106)
Creatinine, Ser: 0.75 mg/dL (ref 0.57–1.00)
Glucose: 203 mg/dL — ABNORMAL HIGH (ref 70–99)
Potassium: 4.1 mmol/L (ref 3.5–5.2)
Sodium: 139 mmol/L (ref 134–144)
eGFR: 89 mL/min/{1.73_m2} (ref >59)

## 2021-07-15 LAB — CBC
Hematocrit: 41.6 % (ref 34.0–46.6)
Hemoglobin: 13.5 g/dL (ref 11.1–15.9)
MCH: 28.2 pg (ref 26.6–33.0)
MCHC: 32.5 g/dL (ref 31.5–35.7)
MCV: 87 fL (ref 79–97)
Platelets: 214 10*3/uL (ref 150–450)
RBC: 4.79 x10E6/uL (ref 3.77–5.28)
RDW: 13.5 % (ref 11.7–15.4)
WBC: 6.4 10*3/uL (ref 3.4–10.8)

## 2021-07-27 ENCOUNTER — Telehealth (HOSPITAL_COMMUNITY): Payer: Self-pay | Admitting: Emergency Medicine

## 2021-07-27 NOTE — Telephone Encounter (Signed)
Reaching out to patient to offer assistance regarding upcoming cardiac imaging study; pt verbalizes understanding of appt date/time, parking situation and where to check in, pre-test NPO status and medications ordered, and verified current allergies; name and call back number provided for further questions should they arise ?Marchia Bond RN Navigator Cardiac Imaging ? [facility] V[facility] ?[phone] office ?[phone] cell ? ?Denies iv issues ?Daily meds, holding allergy meds ?Arrival 100 ?

## 2021-07-28 ENCOUNTER — Telehealth: Payer: Self-pay | Admitting: *Deleted

## 2021-07-28 ENCOUNTER — Telehealth: Payer: Self-pay | Admitting: Cardiology

## 2021-07-28 ENCOUNTER — Ambulatory Visit (HOSPITAL_COMMUNITY)
Admission: RE | Admit: 2021-07-28 | Discharge: 2021-07-28 | Disposition: A | Discharge status: Home / Self Care | Payer: PPO | Source: Ambulatory Visit | Attending: Cardiology | Admitting: Cardiology

## 2021-07-28 DIAGNOSIS — I4819 Other persistent atrial fibrillation: Secondary | ICD-10-CM

## 2021-07-28 MED ORDER — IOHEXOL 350 MG/ML SOLN
95.0000 mL | Freq: Once | INTRAVENOUS | Status: AC | PRN
  Administered 2021-07-28: 95 mL via INTRAVENOUS

## 2021-07-28 NOTE — Telephone Encounter (Signed)
Re-reviewed ablation instructions for next week. ?Aware I will send ablation instructions again via mychart for her review. ?Patient verbalized understanding and agreeable to plan.  ? ?

## 2021-07-28 NOTE — Telephone Encounter (Signed)
[facility] calling to give CT report  ?

## 2021-07-28 NOTE — Telephone Encounter (Signed)
Ruby with radiology called about a critical report from over read today on patient's CT.  ? ?IMPRESSION: ?1. Lap band procedure with dilated distal esophagus with fluid ?level. This suggests dysmotility. ?2. Suspect distal esophageal soft tissue density lesion/polyp, ?including at 2.0 cm. Consider further evaluation with endoscopy. ?3.  Aortic Atherosclerosis (ICD10-I70.0). ?4. 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 ?5. Tiny bilateral pulmonary nodules. No follow-up needed if patient ?is low-risk. Non-contrast chest CT can be considered in 12 months if ?patient is high-risk. This recommendation follows the consensus ?statement: Guidelines for Management of Incidental Pulmonary Nodules ?Detected on CT Images: From the Fleischner Society 2017; Radiology ?2017; [id number]. ?  ? ?Will send to ordering provider.  ?

## 2021-08-01 NOTE — Pre-Procedure Instructions (Signed)
Instructed patient on the following items: Arrival time 0830 Nothing to eat or drink after midnight No meds AM of procedure Responsible person to drive you home and stay with you for 24 hrs  Have you missed any doses of anti-coagulant Xarleto- hasn't missed any doses    

## 2021-08-02 ENCOUNTER — Other Ambulatory Visit: Payer: Self-pay

## 2021-08-02 ENCOUNTER — Ambulatory Visit (HOSPITAL_BASED_OUTPATIENT_CLINIC_OR_DEPARTMENT_OTHER): Payer: PPO | Admitting: Anesthesiology

## 2021-08-02 ENCOUNTER — Ambulatory Visit (HOSPITAL_COMMUNITY)
Admission: RE | Admit: 2021-08-02 | Discharge: 2021-08-02 | Disposition: A | Discharge status: Home / Self Care | Payer: PPO | Attending: Cardiology | Admitting: Cardiology

## 2021-08-02 ENCOUNTER — Ambulatory Visit (HOSPITAL_COMMUNITY): Payer: PPO | Admitting: Anesthesiology

## 2021-08-02 ENCOUNTER — Encounter (HOSPITAL_COMMUNITY)
Admission: RE | Disposition: A | Discharge status: Home / Self Care | Payer: PPO | Source: Home / Self Care | Attending: Cardiology

## 2021-08-02 DIAGNOSIS — I10 Essential (primary) hypertension: Secondary | ICD-10-CM | POA: Insufficient documentation

## 2021-08-02 DIAGNOSIS — E1151 Type 2 diabetes mellitus with diabetic peripheral angiopathy without gangrene: Secondary | ICD-10-CM | POA: Insufficient documentation

## 2021-08-02 DIAGNOSIS — I700 Atherosclerosis of aorta: Secondary | ICD-10-CM | POA: Diagnosis not present

## 2021-08-02 DIAGNOSIS — Z7984 Long term (current) use of oral hypoglycemic drugs: Secondary | ICD-10-CM | POA: Diagnosis not present

## 2021-08-02 DIAGNOSIS — I3481 Nonrheumatic mitral (valve) annulus calcification: Secondary | ICD-10-CM | POA: Insufficient documentation

## 2021-08-02 DIAGNOSIS — I4891 Unspecified atrial fibrillation: Secondary | ICD-10-CM | POA: Diagnosis not present

## 2021-08-02 DIAGNOSIS — Z87891 Personal history of nicotine dependence: Secondary | ICD-10-CM | POA: Diagnosis not present

## 2021-08-02 DIAGNOSIS — E785 Hyperlipidemia, unspecified: Secondary | ICD-10-CM | POA: Insufficient documentation

## 2021-08-02 DIAGNOSIS — Z794 Long term (current) use of insulin: Secondary | ICD-10-CM | POA: Diagnosis not present

## 2021-08-02 DIAGNOSIS — I4819 Other persistent atrial fibrillation: Secondary | ICD-10-CM | POA: Insufficient documentation

## 2021-08-02 HISTORY — PX: ATRIAL FIBRILLATION ABLATION: EP1191

## 2021-08-02 LAB — GLUCOSE, CAPILLARY
Glucose-Capillary: 80 mg/dL (ref 70–99)
Glucose-Capillary: 87 mg/dL (ref 70–99)

## 2021-08-02 LAB — POCT ACTIVATED CLOTTING TIME
Activated Clotting Time: 299 seconds
Activated Clotting Time: 329 seconds

## 2021-08-02 SURGERY — ATRIAL FIBRILLATION ABLATION
Anesthesia: General

## 2021-08-02 MED ORDER — FENTANYL CITRATE (PF) 250 MCG/5ML IJ SOLN
INTRAMUSCULAR | Status: DC | PRN
  Administered 2021-08-02: 50 ug via INTRAVENOUS

## 2021-08-02 MED ORDER — HEPARIN (PORCINE) IN NACL 1000-0.9 UT/500ML-% IV SOLN
INTRAVENOUS | Status: AC
  Filled 2021-08-02: qty 500

## 2021-08-02 MED ORDER — DEXAMETHASONE SODIUM PHOSPHATE 10 MG/ML IJ SOLN
INTRAMUSCULAR | Status: DC | PRN
  Administered 2021-08-02: 5 mg via INTRAVENOUS

## 2021-08-02 MED ORDER — HEPARIN (PORCINE) IN NACL 2000-0.9 UNIT/L-% IV SOLN
INTRAVENOUS | Status: DC | PRN
  Administered 2021-08-02: 1000 mL

## 2021-08-02 MED ORDER — SODIUM CHLORIDE 0.9 % IV SOLN: 250.0000 mL | INTRAVENOUS | Status: DC | PRN

## 2021-08-02 MED ORDER — DOBUTAMINE INFUSION FOR EP/ECHO/NUC (1000 MCG/ML)
INTRAVENOUS | Status: AC
  Filled 2021-08-02: qty 250

## 2021-08-02 MED ORDER — LIDOCAINE 2% (20 MG/ML) 5 ML SYRINGE
INTRAMUSCULAR | Status: DC | PRN
  Administered 2021-08-02: 60 mg via INTRAVENOUS

## 2021-08-02 MED ORDER — PROPOFOL 10 MG/ML IV BOLUS
INTRAVENOUS | Status: DC | PRN
  Administered 2021-08-02: 150 mg via INTRAVENOUS

## 2021-08-02 MED ORDER — HEPARIN (PORCINE) IN NACL 1000-0.9 UT/500ML-% IV SOLN
INTRAVENOUS | Status: DC | PRN
  Administered 2021-08-02 (×5): 500 mL

## 2021-08-02 MED ORDER — PHENYLEPHRINE 40 MCG/ML (10ML) SYRINGE FOR IV PUSH (FOR BLOOD PRESSURE SUPPORT)
PREFILLED_SYRINGE | INTRAVENOUS | Status: DC | PRN
  Administered 2021-08-02: 80 ug via INTRAVENOUS

## 2021-08-02 MED ORDER — ACETAMINOPHEN 325 MG PO TABS
650.0000 mg | ORAL_TABLET | ORAL | Status: DC | PRN
  Filled 2021-08-02: qty 2

## 2021-08-02 MED ORDER — HEPARIN SODIUM (PORCINE) 1000 UNIT/ML IJ SOLN
INTRAMUSCULAR | Status: DC | PRN
  Administered 2021-08-02: 2000 [IU] via INTRAVENOUS
  Administered 2021-08-02: 3000 [IU] via INTRAVENOUS
  Administered 2021-08-02: 14000 [IU] via INTRAVENOUS

## 2021-08-02 MED ORDER — DOBUTAMINE INFUSION FOR EP/ECHO/NUC (1000 MCG/ML)
INTRAVENOUS | Status: DC | PRN
  Administered 2021-08-02: 20 ug/kg/min via INTRAVENOUS

## 2021-08-02 MED ORDER — SODIUM CHLORIDE 0.9% FLUSH: 3.0000 mL | Freq: Two times a day (BID) | INTRAVENOUS | Status: DC

## 2021-08-02 MED ORDER — ACETAMINOPHEN 500 MG PO TABS
1000.0000 mg | ORAL_TABLET | Freq: Once | ORAL | Status: AC
  Administered 2021-08-02: 1000 mg via ORAL
  Filled 2021-08-02: qty 2

## 2021-08-02 MED ORDER — SODIUM CHLORIDE 0.9 % IV SOLN: INTRAVENOUS | Status: DC | PRN

## 2021-08-02 MED ORDER — ONDANSETRON HCL 4 MG/2ML IJ SOLN: 4.0000 mg | Freq: Four times a day (QID) | INTRAMUSCULAR | Status: DC | PRN

## 2021-08-02 MED ORDER — PROTAMINE SULFATE 10 MG/ML IV SOLN
INTRAVENOUS | Status: DC | PRN
  Administered 2021-08-02: 40 mg via INTRAVENOUS

## 2021-08-02 MED ORDER — SODIUM CHLORIDE 0.9 % IV SOLN: INTRAVENOUS | Status: DC

## 2021-08-02 MED ORDER — ROCURONIUM BROMIDE 10 MG/ML (PF) SYRINGE
PREFILLED_SYRINGE | INTRAVENOUS | Status: DC | PRN
  Administered 2021-08-02: 50 mg via INTRAVENOUS

## 2021-08-02 MED ORDER — SODIUM CHLORIDE 0.9% FLUSH: 3.0000 mL | INTRAVENOUS | Status: DC | PRN

## 2021-08-02 MED ORDER — MIDAZOLAM HCL 2 MG/2ML IJ SOLN
INTRAMUSCULAR | Status: DC | PRN
  Administered 2021-08-02: 2 mg via INTRAVENOUS

## 2021-08-02 MED ORDER — SUGAMMADEX SODIUM 200 MG/2ML IV SOLN
INTRAVENOUS | Status: DC | PRN
  Administered 2021-08-02: 200 mg via INTRAVENOUS

## 2021-08-02 MED ORDER — ONDANSETRON HCL 4 MG/2ML IJ SOLN
INTRAMUSCULAR | Status: DC | PRN
  Administered 2021-08-02: 4 mg via INTRAVENOUS

## 2021-08-02 MED ORDER — HEPARIN SODIUM (PORCINE) 1000 UNIT/ML IJ SOLN
INTRAMUSCULAR | Status: AC
  Filled 2021-08-02: qty 10

## 2021-08-02 MED ORDER — PHENYLEPHRINE HCL-NACL 20-0.9 MG/250ML-% IV SOLN
INTRAVENOUS | Status: DC | PRN
  Administered 2021-08-02: 20 ug/min via INTRAVENOUS

## 2021-08-02 SURGICAL SUPPLY — 19 items
BAG SNAP BAND KOVER 36X36 (MISCELLANEOUS) ×1 IMPLANT
CATH OCTARAY 2.0 F 3-3-3-3-3 (CATHETERS) ×1 IMPLANT
CATH PIGTAIL STEERABLE D1 8.7 (WIRE) ×2 IMPLANT
CATH S CIRCA THERM PROBE 10F (CATHETERS) ×1 IMPLANT
CATH SMTCH THERMOCOOL SF DF (CATHETERS) ×1 IMPLANT
CATH SOUNDSTAR 3D IMAGING (CATHETERS) ×1 IMPLANT
CATH WEBSTER BI DIR CS D-F CRV (CATHETERS) ×1 IMPLANT
CLOSURE PERCLOSE PROSTYLE (VASCULAR PRODUCTS) ×3 IMPLANT
COVER SWIFTLINK CONNECTOR (BAG) ×3 IMPLANT
PACK EP LATEX FREE (CUSTOM PROCEDURE TRAY) ×2
PACK EP LF (CUSTOM PROCEDURE TRAY) ×2 IMPLANT
PAD DEFIB RADIO PHYSIO CONN (PAD) ×3 IMPLANT
PATCH CARTO3 (PAD) ×1 IMPLANT
SHEATH AVANTI 11F 11CM (SHEATH) ×1 IMPLANT
SHEATH CARTO VIZIGO SM CVD (SHEATH) ×1 IMPLANT
SHEATH PINNACLE 7F 10CM (SHEATH) IMPLANT
SHEATH PINNACLE 8F 10CM (SHEATH) ×2 IMPLANT
SHEATH PROBE COVER 6X72 (BAG) ×1 IMPLANT
TUBING SMART ABLATE COOLFLOW (TUBING) ×1 IMPLANT

## 2021-08-02 NOTE — Progress Notes (Signed)
Pt bled while ambulating to restroom after bedrest. Once dressing was removed, RN noted only 1 site was oozing but no longer actively bleeding. Jonni Sanger, PA to bedside. Pt to ambulate again in 15 minutes. ?

## 2021-08-02 NOTE — Transfer of Care (Signed)
Immediate Anesthesia Transfer of Care Note ? ?Patient: Theresa Harrington ? ?Procedure(s) Performed: ATRIAL FIBRILLATION ABLATION ? ?Patient Location: PACU and Cath Lab ? ?Anesthesia Type:General ? ?Level of Consciousness: drowsy and patient cooperative ? ?Airway & Oxygen Therapy: Patient Spontanous Breathing and Patient connected to nasal cannula oxygen ? ?Post-op Assessment: Report given to RN and Post -op Vital signs reviewed and stable ? ?Post vital signs: Reviewed and stable ? ?Last Vitals:  ?Vitals Value Taken Time  ?BP 163/82   ?Temp    ?Pulse 64   ?Resp 12   ?SpO2 100   ? ? ?Last Pain:  ?Vitals:  ? 08/02/21 0907  ?TempSrc:   ?PainSc: 0-No pain  ?   ? ?  ? ?Complications: There were no known notable events for this encounter. ?

## 2021-08-02 NOTE — H&P (Signed)
? ?Electrophysiology Office Note ? ? ?Date:  08/02/2021  ? ?Theresa Harrington, DOB 20-Nov-1955, MRN [id number] ? ?PCP:  [name], MD  ?Cardiologist:  Marlou Porch ?Primary Electrophysiologist:  Geisha Abernathy Meredith Leeds, MD   ? ?Chief Complaint: AF ?  ?History of Present Illness: ?Theresa Harrington is a 66 y.o. female who is being seen today for the evaluation of AF at the request of No ref. provider found. Presenting today for electrophysiology evaluation. ? ?She has a history significant for hypertension, hyperlipidemia, PAD, atrial fibrillation, type 2 diabetes.  She also has severe mitral annular calcification with thickening of the posterior leaflet with immobile edges.  She has mild stenosis and no regurgitation.  When seen most recently in cardiology clinic, she was noted to be in atrial fibrillation.  Her heart rates fluctuate between 70 and 120 bpm. ? ?Today, denies symptoms of palpitations, chest pain, shortness of breath, orthopnea, PND, lower extremity edema, claudication, dizziness, presyncope, syncope, bleeding, or neurologic sequela. The patient is tolerating medications without difficulties. Plan AF ablation today.  ? ? ?Past Medical History:  ?Diagnosis Date  ? Allergy   ? Anxiety   ? Pt. denies  ? Arthritis   ? right index figer  ? Asymptomatic cholelithiasis   ? Atherosclerosis of aorta ([place])   ? Cataract   ? Clotting disorder ([place])   ? Gallstones 06/2013  ? GERD (gastroesophageal reflux disease)   ? History of kidney stones   ? lithotrispy  ? Hyperlipidemia   ? Hypertension   ? Hypothyroidism   ? no meds  ? Iron deficiency anemia   ? PAD (peripheral artery disease) ([place])   ? PAF (paroxysmal atrial fibrillation) ([place])   ? Peripheral arterial occlusive disease ([place])   ? lower extremities  ? Pneumonia   ? PONV (postoperative nausea and vomiting)   ? Right ureteral stone   ? Type 2 diabetes mellitus ([place])   ? Type  ? Vitamin B 12 deficiency   ? Wears glasses   ? ?Past Surgical History:  ?Procedure Laterality Date  ?  ABDOMINAL AORTOGRAM W/LOWER EXTREMITY N/A 03/09/2020  ? Procedure: ABDOMINAL AORTOGRAM W/LOWER EXTREMITY;  Surgeon: Serafina Mitchell, MD;  Location: [place] [facility];  Service: Cardiovascular;  Laterality: N/A;  ? AMPUTATION Left 07/16/2014  ? Procedure: LEFT SECOND TOE AMPUTATION;  Surgeon: Serafina Mitchell, MD;  Location: [place];  Service: Vascular;  Laterality: Left;  With Nerve block  ? AUGMENTATION MAMMAPLASTY    ? [place]    ? eyelid lift  ? BIOPSY  10/14/2018  ? Procedure: BIOPSY;  Surgeon: Irving Copas., MD;  Location: [place];  Service: Gastroenterology;;  ? BIOPSY  08/04/2019  ? Procedure: BIOPSY;  Surgeon: Irving Copas., MD;  Location: [place];  Service: Gastroenterology;;  ? CARDIOVERSION N/A 09/19/2019  ? Procedure: CARDIOVERSION;  Surgeon: Buford Dresser, MD;  Location: [facility];  Service: Cardiovascular;  Laterality: N/A;  ? COLONOSCOPY    ? COMBINED AUGMENTATION MAMMAPLASTY AND ABDOMINOPLASTY  2009  ? W/  BILATERAL  THIGH LIFT  ? CYSTO/  RIGHT URETERAL STENT PLACEMENT  12-30-2010  ? CYSTOSCOPY WITH RETROGRADE PYELOGRAM, URETEROSCOPY AND STENT PLACEMENT Right 10/07/2013  ? Procedure: CYSTOSCOPY WITH RETROGRADE PYELOGRAM, right URETEROSCOPY AND STENT PLACEMENT, stone extraction;  Surgeon: Arvil Persons, MD;  Location[facility];  Service: Urology;  Laterality: Right;  ? CYSTOSCOPY WITH STENT PLACEMENT Right 06/04/2013  ? Procedure: CYSTOSCOPY WITH STENT PLACEMENT;  Surgeon: Franchot Gallo, MD;  [facility]: [facility];  Service: Urology;  Laterality: Right;  ? DILATATION & CURETTAGE/HYSTEROSCOPY WITH MYOSURE N/A 04/27/2015  ? Procedure: DILATATION & CURETTAGE/HYSTEROSCOPY WITH MYOSURE;  Surgeon: Terrance Mass, MD;  Location: [place] [facility];  Service: Gynecology;  Laterality: N/A;  ? ENDARTERECTOMY FEMORAL Left 06/08/2014  ? Procedure: Left Leg Common Femoral and External Iliac  Endartarectomy with patch Angioplasty;  Surgeon: Rosetta Posner, MD;   Location: [place];  Service: Vascular;  Laterality: Left;  ? ESOPHAGOGASTRODUODENOSCOPY N/A 10/14/2018  ? Procedure: ESOPHAGOGASTRODUODENOSCOPY (EGD);  Surgeon: Irving Copas., MD;  Location: [place];  Service: Gastroenterology;  Laterality: N/A;  ? ESOPHAGOGASTRODUODENOSCOPY (EGD) WITH PROPOFOL N/A 11/06/2018  ? Procedure: ESOPHAGOGASTRODUODENOSCOPY (EGD) WITH PROPOFOL;  Surgeon: Rush Landmark Telford Nab., MD;  Location[facility];  Service: Gastroenterology;  Laterality: N/A;  RFA  ? ESOPHAGOGASTRODUODENOSCOPY (EGD) WITH PROPOFOL N/A 02/12/2019  ? Procedure: ESOPHAGOGASTRODUODENOSCOPY (EGD) WITH PROPOFOL;  Surgeon: Rush Landmark Telford Nab., MD;  Location: [facility];  Service: Gastroenterology;  Laterality: N/A;  ? ESOPHAGOGASTRODUODENOSCOPY (EGD) WITH PROPOFOL N/A 08/04/2019  ? Procedure: ESOPHAGOGASTRODUODENOSCOPY (EGD) WITH PROPOFOL;  Surgeon: Rush Landmark Telford Nab., MD;  Location: [place];  Service: Gastroenterology;  Laterality: N/A;  WITH RFA  ? EXTRACORPOREAL SHOCK WAVE LITHOTRIPSY Right 08-04-2013//   06-23-2013//   01-16-2011  ? EYE SURGERY Bilateral   ? cataract  ? FEMORAL-POPLITEAL BYPASS GRAFT Left 06/08/2014  ? Procedure: Left Leg Femoral -Popliteal Bypass Graft;  Surgeon: Rosetta Posner, MD;  Location: [place];  Service: Vascular;  Laterality: Left;  ? FEMORAL-POPLITEAL BYPASS GRAFT Left 06/09/2014  ? Procedure: Left Femoral and Popliteal Exposure; Left Femoral to Anterior Tibial Bypass Graft using Propaten 61m by 80cm Goretex Graft; Left Tibial Endarterectomy; Left Femoraland Popliteal Thrombectomy ;  Surgeon: VSerafina Mitchell MD;  [place]  Service: Vascular;  Laterality: Left;  ? GI RADIOFREQUENCY ABLATION N/A 11/06/2018  ? Procedure: GI RADIOFREQUENCY ABLATION;  Surgeon: MRush LandmarkGTelford Nab, MD;  [facility];  Service: Gastroenterology;  Laterality: N/A;  ? GI RADIOFREQUENCY ABLATION N/A 02/12/2019  ? Procedure: GI RADIOFREQUENCY ABLATION;  Surgeon: MRush LandmarkGTelford Nab, MD;  [facility];  Service: Gastroenterology;  Laterality: N/A;  ? GI RADIOFREQUENCY ABLATION N/A 08/04/2019  ? Procedure: GI RADIOFREQUENCY ABLATION;  Surgeon: MRush LandmarkGTelford Nab, MD;  [place]  Service: Gastroenterology;  Laterality: N/A;  ? HOLMIUM LASER APPLICATION Right 60/10/6224 ? Procedure: HOLMIUM LASER APPLICATION;  Surgeon: MArvil Persons MD;  [facility]  Service: Urology;  Laterality: Right;  ? KIDNEY STONE SURGERY  April 2015  ? 1-2 stones  ? LAPAROSCOPIC GASTRIC BANDING  05-29-2005  ? LITHOTRIPSY  2-3 times  ? LOWER EXTREMITY ANGIOGRAM N/A 06/04/2014  ? Procedure: LOWER EXTREMITY ANGIOGRAM;  Surgeon: VSerafina Mitchell MD;  [facility];  Service: Cardiovascular;  Laterality: N/A;  ? ORIF FIFTH [place]ARPAL [place] RIGHT HAND  04-21-2002  ? REVISION AND RE-SITING LAP-BAND PORT  04-08-2010  ? W/  UPPER EGD  ? RIGHT KNEE PATELLECTOMY W/ REPAIR OF EXTENSOR MECHANISM  04-14-2002  ? TEE WITHOUT CARDIOVERSION N/A 12/24/2019  ? Procedure: TRANSESOPHAGEAL ECHOCARDIOGRAM (TEE);  Surgeon: TSueanne Margarita MD;  [place]  Service: Cardiovascular;  Laterality: N/A;  ? Toenail removed Left Jan. 21, 2016  ? 2nd toenail-  Dr. PBarkley Bruns ? ? ? ?Current Facility-Administered Medications  ?Medication Dose Route Frequency Provider Last Rate Last Admin  ? 0.9 %  sodium chloride infusion   Intravenous Continuous Kassey Laforest MHassell Done  MD 50 mL/hr at 08/02/21 0911 New Bag at 08/02/21 0911  ? ? ?Allergies:   Amlodipine, Atorvastatin, Ampicillin, Codeine, Lisinopril, Penicillins, and Januvia [sitagliptin]  ? ?Social History:  The patient  reports that she quit smoking about 19 years ago. Her smoking use included cigarettes. She has a 28.00 pack-year smoking history. She has never used smokeless tobacco. She reports that she does not drink alcohol and does not use drugs.  ? ?Family History:  The patient's family history includes Arthritis in an other family  member; Cancer in her brother and another family member; Colon cancer in her maternal aunt; Deep vein thrombosis in her father and sister; Diabetes in her brother, brother, brother, father, and sister; Heart di

## 2021-08-02 NOTE — Anesthesia Preprocedure Evaluation (Addendum)
Anesthesia Evaluation  ?Patient identified by MRN, date of birth, ID band ?Patient awake ? ? ? ?Reviewed: ?Allergy & Precautions, NPO status , Patient's Chart, lab work & pertinent test results, reviewed documented beta blocker date and time  ? ?History of Anesthesia Complications ?(+) PONV and history of anesthetic complications ? ?Airway ?Mallampati: III ? ?TM Distance: >3 FB ?Neck ROM: Full ? ? ? Dental ? ?(+) Dental Advisory Given ?  ?Pulmonary ?former smoker,  ?  ?Pulmonary exam normal ? ? ? ? ? ? ? Cardiovascular ?hypertension, Pt. on medications and Pt. on home beta blockers ?+ Peripheral Vascular Disease  ?+ dysrhythmias Atrial Fibrillation + Valvular Problems/Murmurs  ?Rhythm:Irregular Rate:Normal ? ? ?'22 Carotid US - 1-39% b/l ICAS ? ?'22 TTE - EF 65 to 70%. There is mild left ventricular hypertrophy. Grade II diastolic dysfunction (pseudonormalization). Elevated left atrial pressure. Left atrial size was severely dilated. Right atrial size was mildly dilated. Mild to moderate mitral stenosis. MG 56mHg, MVA 1.4 cm^2 by continuity equation. Mild to moderate aortic valve sclerosis/calcification is present, without any evidence of aortic stenosis.  ? ?  ?Neuro/Psych ?PSYCHIATRIC DISORDERS Anxiety   ? GI/Hepatic ?Neg liver ROS, GERD  Medicated and Controlled,  ?Endo/Other  ?diabetes, Type 2, Insulin Dependent, Oral Hypoglycemic AgentsHypothyroidism  ? Renal/GU ?negative Renal ROS  ? ?  ?Musculoskeletal ? ?(+) Arthritis ,  ? Abdominal ?  ?Peds ? Hematology ? ?On xarelto ?   ?Anesthesia Other Findings ? ? Reproductive/Obstetrics ? ?  ? ? ? ? ? ? ? ? ? ? ? ? ? ?  ?  ? ? ? ? ? ? ? ?Anesthesia Physical ?Anesthesia Plan ? ?ASA: 3 ? ?Anesthesia Plan: General  ? ?Post-op Pain Management: Tylenol PO (pre-op)*  ? ?Induction: Intravenous ? ?PONV Risk Score and Plan: 4 or greater and Treatment may vary due to age or medical condition, Ondansetron, Dexamethasone and Midazolam ? ?Airway  Management Planned: Oral ETT ? ?Additional Equipment: None ? ?Intra-op Plan:  ? ?Post-operative Plan: Extubation in OR ? ?Informed Consent: I have reviewed the patients History and Physical, chart, labs and discussed the procedure including the risks, benefits and alternatives for the proposed anesthesia with the patient or authorized representative who has indicated his/her understanding and acceptance.  ? ? ? ?Dental advisory given ? ?Plan Discussed with: CRNA and Anesthesiologist ? ?Anesthesia Plan Comments:   ? ? ? ? ? ?Anesthesia Quick Evaluation ? ?

## 2021-08-02 NOTE — Anesthesia Postprocedure Evaluation (Signed)
Anesthesia Post Note ? ?Patient: Theresa Harrington ? ?Procedure(s) Performed: ATRIAL FIBRILLATION ABLATION ? ?  ? ?Patient location during evaluation: PACU ?Anesthesia Type: General ?Level of consciousness: awake and alert ?Pain management: pain level controlled ?Vital Signs Assessment: post-procedure vital signs reviewed and stable ?Respiratory status: spontaneous breathing, nonlabored ventilation and respiratory function stable ?Cardiovascular status: stable and blood pressure returned to baseline ?Anesthetic complications: no ? ? ?There were no known notable events for this encounter. ? ?Last Vitals:  ?Vitals:  ? 08/02/21 1310 08/02/21 1316  ?BP: (!) 177/91 (!) 182/87  ?Pulse: 70 70  ?Resp: 16 16  ?Temp:  (!) 36.3 ?C  ?SpO2: 97% 99%  ?  ?Last Pain:  ?Vitals:  ? 08/02/21 1316  ?TempSrc:   ?PainSc: 0-No pain  ? ? ?  ?  ?  ?  ?  ?  ? ?Audry Pili ? ? ? ? ?

## 2021-08-02 NOTE — Discharge Instructions (Signed)

## 2021-08-02 NOTE — Anesthesia Procedure Notes (Signed)
Procedure Name: Intubation ?Date/Time: 08/02/2021 10:30 AM ?Performed by: Thelma Comp, CRNA ?Pre-anesthesia Checklist: Patient identified, Emergency Drugs available, Suction available and Patient being monitored ?Patient Re-evaluated:Patient Re-evaluated prior to induction ?Oxygen Delivery Method: Circle System Utilized ?Preoxygenation: Pre-oxygenation with 100% oxygen ?Induction Type: IV induction ?Ventilation: Mask ventilation without difficulty ?Laryngoscope Size: Mac and 3 ?Grade View: Grade I ?Tube type: Oral ?Number of attempts: 1 ?Airway Equipment and Method: Stylet and Oral airway ?Placement Confirmation: ETT inserted through vocal cords under direct vision, positive ETCO2 and breath sounds checked- equal and bilateral ?Secured at: 22 cm ?Tube secured with: Tape ?Dental Injury: Teeth and Oropharynx as per pre-operative assessment  ? ? ? ? ?

## 2021-08-03 ENCOUNTER — Encounter (HOSPITAL_COMMUNITY): Payer: Self-pay | Admitting: Cardiology

## 2021-08-11 ENCOUNTER — Encounter (HOSPITAL_COMMUNITY): Payer: Self-pay | Admitting: Nurse Practitioner

## 2021-08-11 ENCOUNTER — Ambulatory Visit (HOSPITAL_COMMUNITY)
Admission: RE | Admit: 2021-08-11 | Discharge: 2021-08-11 | Disposition: A | Discharge status: Home / Self Care | Payer: PPO | Source: Ambulatory Visit | Attending: Nurse Practitioner | Admitting: Nurse Practitioner

## 2021-08-11 VITALS — BP 164/72 | HR 61 | Ht 64.0 in | Wt 169.4 lb

## 2021-08-11 DIAGNOSIS — I480 Paroxysmal atrial fibrillation: Secondary | ICD-10-CM

## 2021-08-11 DIAGNOSIS — Z7901 Long term (current) use of anticoagulants: Secondary | ICD-10-CM | POA: Insufficient documentation

## 2021-08-11 DIAGNOSIS — I4891 Unspecified atrial fibrillation: Secondary | ICD-10-CM | POA: Insufficient documentation

## 2021-08-11 DIAGNOSIS — D6869 Other thrombophilia: Secondary | ICD-10-CM

## 2021-08-11 DIAGNOSIS — I10 Essential (primary) hypertension: Secondary | ICD-10-CM | POA: Diagnosis not present

## 2021-08-11 NOTE — Progress Notes (Addendum)
? ?Primary Care Physician: [name], MD ?Referring Physician:Dr, Camnitz  ? ? ?Theresa Harrington is a 66 y.o. female with a h/o afib that is in the afib clinic 08/11/21 for f/u ablation form 08/02/21. This should have been a one month appointment but was not rescheduled when her ablation was moved from  March to April. She is in SR and only noted a few hours of afib within the first 48 hours, no swallowing or goin issues. Being compliant with meds.  ? ?Today, she denies symptoms of palpitations, chest pain, shortness of breath, orthopnea, PND, lower extremity edema, dizziness, presyncope, syncope, or neurologic sequela. The patient is tolerating medications without difficulties and is otherwise without complaint today.  ? ?Past Medical History:  ?Diagnosis Date  ? Allergy   ? Anxiety   ? Pt. denies  ? Arthritis   ? right index figer  ? Asymptomatic cholelithiasis   ? Atherosclerosis of aorta ([place])   ? Cataract   ? Clotting disorder ([place])   ? Gallstones 06/2013  ? GERD (gastroesophageal reflux disease)   ? History of kidney stones   ? lithotrispy  ? Hyperlipidemia   ? Hypertension   ? Hypothyroidism   ? no meds  ? Iron deficiency anemia   ? PAD (peripheral artery disease) ([place])   ? PAF (paroxysmal atrial fibrillation) ([place])   ? Peripheral arterial occlusive disease ([place])   ? lower extremities  ? Pneumonia   ? PONV (postoperative nausea and vomiting)   ? Right ureteral stone   ? Type 2 diabetes mellitus ([place])   ? Type  ? Vitamin B 12 deficiency   ? Wears glasses   ? ?Past Surgical History:  ?Procedure Laterality Date  ? ABDOMINAL AORTOGRAM W/LOWER EXTREMITY N/A 03/09/2020  ? Procedure: ABDOMINAL AORTOGRAM W/LOWER EXTREMITY;  Surgeon: Serafina Mitchell, MD;  Location: [place] [facility];  Service: Cardiovascular;  Laterality: N/A;  ? AMPUTATION Left 07/16/2014  ? Procedure: LEFT SECOND TOE AMPUTATION;  Surgeon: Serafina Mitchell, MD;  Location: [place];  Service: Vascular;  Laterality: Left;  With Nerve block  ? ATRIAL  FIBRILLATION ABLATION N/A 08/02/2021  ? Procedure: ATRIAL FIBRILLATION ABLATION;  Surgeon: Constance Haw, MD;  Location: [place] [facility];  Service: Cardiovascular;  Laterality: N/A;  ? AUGMENTATION MAMMAPLASTY    ? [place]    ? eyelid lift  ? BIOPSY  10/14/2018  ? Procedure: BIOPSY;  Surgeon: Irving Copas., MD;  Location: [place];  Service: Gastroenterology;;  ? BIOPSY  08/04/2019  ? Procedure: BIOPSY;  Surgeon: Irving Copas., MD;  Location: [place];  Service: Gastroenterology;;  ? CARDIOVERSION N/A 09/19/2019  ? Procedure: CARDIOVERSION;  Surgeon: Buford Dresser, MD;  Location[facility];  Service: Cardiovascular;  Laterality: N/A;  ? COLONOSCOPY    ? COMBINED AUGMENTATION MAMMAPLASTY AND ABDOMINOPLASTY  2009  ? W/  BILATERAL  THIGH LIFT  ? CYSTO/  RIGHT URETERAL STENT PLACEMENT  12-30-2010  ? CYSTOSCOPY WITH RETROGRADE PYELOGRAM, URETEROSCOPY AND STENT PLACEMENT Right 10/07/2013  ? Procedure: CYSTOSCOPY WITH RETROGRADE PYELOGRAM, right URETEROSCOPY AND STENT PLACEMENT, stone extraction;  Surgeon: Arvil Persons, MD;  Location: [facility];  Service: Urology;  Laterality: Right;  ? CYSTOSCOPY WITH STENT PLACEMENT Right 06/04/2013  ? Procedure: CYSTOSCOPY WITH STENT PLACEMENT;  Surgeon: Franchot Gallo, MD;  [facility];  Service: Urology;  Laterality: Right;  ? DILATATION & CURETTAGE/HYSTEROSCOPY WITH MYOSURE N/A 04/27/2015  ? Procedure: [place];  Surgeon:  Terrance Mass, MD;  Location: [place] [facility];  Service: Gynecology;  Laterality: N/A;  ? ENDARTERECTOMY FEMORAL Left 06/08/2014  ? Procedure: Left Leg Common Femoral and External Iliac  Endartarectomy with patch Angioplasty;  Surgeon: Rosetta Posner, MD;  Location: [place];  Service: Vascular;  Laterality: Left;  ? ESOPHAGOGASTRODUODENOSCOPY N/A 10/14/2018  ? Procedure: ESOPHAGOGASTRODUODENOSCOPY (EGD);  Surgeon: Irving Copas., MD;  Location: [place];  Service: Gastroenterology;  Laterality: N/A;  ? ESOPHAGOGASTRODUODENOSCOPY (EGD) WITH PROPOFOL N/A 11/06/2018  ? Procedure: ESOPHAGOGASTRODUODENOSCOPY (EGD) WITH PROPOFOL;  Surgeon: Rush Landmark Telford Nab., MD;  Location: [facility];  Service: Gastroenterology;  Laterality: N/A;  RFA  ? ESOPHAGOGASTRODUODENOSCOPY (EGD) WITH PROPOFOL N/A 02/12/2019  ? Procedure: ESOPHAGOGASTRODUODENOSCOPY (EGD) WITH PROPOFOL;  Surgeon: Rush Landmark Telford Nab., MD;  Location: [facility];  Service: Gastroenterology;  Laterality: N/A;  ? ESOPHAGOGASTRODUODENOSCOPY (EGD) WITH PROPOFOL N/A 08/04/2019  ? Procedure: ESOPHAGOGASTRODUODENOSCOPY (EGD) WITH PROPOFOL;  Surgeon: Rush Landmark Telford Nab., MD;  Location: [place];  Service: Gastroenterology;  Laterality: N/A;  WITH RFA  ? EXTRACORPOREAL SHOCK WAVE LITHOTRIPSY Right 08-04-2013//   06-23-2013//   01-16-2011  ? EYE SURGERY Bilateral   ? cataract  ? FEMORAL-POPLITEAL BYPASS GRAFT Left 06/08/2014  ? Procedure: Left Leg Femoral -Popliteal Bypass Graft;  Surgeon: Rosetta Posner, MD;  Location: [place];  Service: Vascular;  Laterality: Left;  ? FEMORAL-POPLITEAL BYPASS GRAFT Left 06/09/2014  ? Procedure: Left Femoral and Popliteal Exposure; Left Femoral to Anterior Tibial Bypass Graft using Propaten 65m by 80cm Goretex Graft; Left Tibial Endarterectomy; Left Femoraland Popliteal Thrombectomy ;  Surgeon: VSerafina Mitchell MD;  Location[place]  Service: Vascular;  Laterality: Left;  ? GI RADIOFREQUENCY ABLATION N/A 11/06/2018  ? Procedure: GI RADIOFREQUENCY ABLATION;  Surgeon: MRush LandmarkGTelford Nab, MD;  [facility];  Service: Gastroenterology;  Laterality: N/A;  ? GI RADIOFREQUENCY ABLATION N/A 02/12/2019  ? Procedure: GI RADIOFREQUENCY ABLATION;  Surgeon: MRush LandmarkGTelford Nab, MD;  [facility];  Service: Gastroenterology;  Laterality: N/A;  ? GI RADIOFREQUENCY ABLATION N/A 08/04/2019  ? Procedure: GI RADIOFREQUENCY ABLATION;  Surgeon: MRush LandmarkGTelford Nab,  MD;  [place]  Service: Gastroenterology;  Laterality: N/A;  ? HOLMIUM LASER APPLICATION Right 66/10/8936 ? Procedure: HOLMIUM LASER APPLICATION;  Surgeon: MArvil Persons MD;  [facility]  Service: Urology;  Laterality: Right;  ? KIDNEY STONE SURGERY  April 2015  ? 1-2 stones  ? LAPAROSCOPIC GASTRIC BANDING  05-29-2005  ? LITHOTRIPSY  2-3 times  ? LOWER EXTREMITY ANGIOGRAM N/A 06/04/2014  ? Procedure: LOWER EXTREMITY ANGIOGRAM;  Surgeon: VSerafina Mitchell MD;  [facility];  Service: Cardiovascular;  Laterality: N/A;  ? ORIF FIFTH METACARPAL [place] RIGHT HAND  04-21-2002  ? REVISION AND RE-SITING LAP-BAND PORT  04-08-2010  ? W/  UPPER EGD  ? RIGHT KNEE PATELLECTOMY W/ REPAIR OF EXTENSOR MECHANISM  04-14-2002  ? TEE WITHOUT CARDIOVERSION N/A 12/24/2019  ? Procedure: TRANSESOPHAGEAL ECHOCARDIOGRAM (TEE);  Surgeon: TSueanne Margarita MD;  Location[place]  Service: Cardiovascular;  Laterality: N/A;  ? Toenail removed Left Jan. 21, 2016  ? 2nd toenail-  Dr. PBarkley Bruns ? ? ?Current Outpatient Medications  ?Medication Sig Dispense Refill  ? Blood Glucose Monitoring Suppl (ONETOUCH VERIO IQ SYSTEM) w/Device KIT USE TO CHECK SUGAR DAILY 1 kit 0  ? cetirizine (ZYRTEC) 10 MG tablet Take 10 mg by mouth daily as needed for allergies.    ? dorzolamide-timolol (COSOPT) 22.3-6.8 MG/ML ophthalmic solution Place 1  drop into the right eye 2 (two) times daily.     ? Empagliflozin-metFORMIN HCl ER (SYNJARDY XR) 12.08-998 MG TB24 Take 1 tablet by mouth daily. Via [employer] patient assistance 90 tablet 1  ? ezetimibe (ZETIA) 10 MG tablet TAKE ONE TABLET BY MOUTH EVERYDAY AT BEDTIME 90 tablet 1  ? Insulin Glargine (BASAGLAR KWIKPEN) 100 UNIT/ML Inject 50 Units into the skin at bedtime. [place] patient assistance (Patient taking differently: Inject 40-42 Units into the skin at bedtime. Via [employer] patient assistance) 30 mL 2  ? insulin lispro (HUMALOG) 100 UNIT/ML injection Inject 10-14  Units into the skin 3 (three) times daily before meals. [place] patient assistance ?Sliding scale    ? Insulin Pen Needle (PEN NEEDLES) 32G X 4 MM MISC Inject 1 pen as directed 4 (four) times daily. Use to

## 2021-08-12 DIAGNOSIS — M25511 Pain in right shoulder: Secondary | ICD-10-CM | POA: Insufficient documentation

## 2021-08-17 ENCOUNTER — Telehealth: Payer: Self-pay

## 2021-08-17 ENCOUNTER — Other Ambulatory Visit: Payer: Self-pay | Admitting: Internal Medicine

## 2021-08-17 DIAGNOSIS — I4891 Unspecified atrial fibrillation: Secondary | ICD-10-CM

## 2021-08-17 NOTE — Telephone Encounter (Signed)
Pt is requesting a refill on: ?rivaroxaban (XARELTO) 20 MG TABS tablet ? ?Pt is requesting that nebivolol (BYSTOLIC) 10 MG tablet be changed to nebivolol (BYSTOLIC) 5 MG tablet BID due to the price difference with insurance ? ?Please advise ? ? ?

## 2021-08-19 ENCOUNTER — Other Ambulatory Visit: Payer: Self-pay | Admitting: Internal Medicine

## 2021-08-21 ENCOUNTER — Other Ambulatory Visit: Payer: Self-pay | Admitting: Internal Medicine

## 2021-08-22 ENCOUNTER — Other Ambulatory Visit: Payer: Self-pay | Admitting: Internal Medicine

## 2021-08-22 DIAGNOSIS — I4891 Unspecified atrial fibrillation: Secondary | ICD-10-CM

## 2021-08-22 DIAGNOSIS — I10 Essential (primary) hypertension: Secondary | ICD-10-CM

## 2021-08-24 ENCOUNTER — Telehealth: Payer: Self-pay

## 2021-08-24 DIAGNOSIS — H53032 Strabismic amblyopia, left eye: Secondary | ICD-10-CM | POA: Diagnosis not present

## 2021-08-24 DIAGNOSIS — H26491 Other secondary cataract, right eye: Secondary | ICD-10-CM | POA: Diagnosis not present

## 2021-08-24 DIAGNOSIS — E119 Type 2 diabetes mellitus without complications: Secondary | ICD-10-CM | POA: Diagnosis not present

## 2021-08-24 DIAGNOSIS — H401121 Primary open-angle glaucoma, left eye, mild stage: Secondary | ICD-10-CM | POA: Diagnosis not present

## 2021-08-24 DIAGNOSIS — D3131 Benign neoplasm of right choroid: Secondary | ICD-10-CM | POA: Diagnosis not present

## 2021-08-24 DIAGNOSIS — Z961 Presence of intraocular lens: Secondary | ICD-10-CM | POA: Diagnosis not present

## 2021-08-24 DIAGNOSIS — H401113 Primary open-angle glaucoma, right eye, severe stage: Secondary | ICD-10-CM | POA: Diagnosis not present

## 2021-08-24 LAB — HM DIABETES EYE EXAM

## 2021-08-24 NOTE — Telephone Encounter (Signed)
Spoke with patient, will send updated prescription for humalog kwik pens to [facility] ? ?Patient to reach out with any other questions or concerns ? ?Tomasa Blase, PharmD ?Clinical Pharmacist, [place]  ? ?

## 2021-08-24 NOTE — Telephone Encounter (Signed)
Pt states the insulin lispro (HUMALOG) 100 UNIT/ML injection is not what she should be getting she is requesting a HUMALOG quik pen ? ?Pt is requesting a CB ?

## 2021-09-20 ENCOUNTER — Ambulatory Visit: Payer: PPO | Admitting: Gastroenterology

## 2021-09-20 ENCOUNTER — Encounter: Payer: Self-pay | Admitting: Gastroenterology

## 2021-09-20 VITALS — BP 90/60 | HR 67 | Ht 64.0 in | Wt 164.0 lb

## 2021-09-20 DIAGNOSIS — Z8719 Personal history of other diseases of the digestive system: Secondary | ICD-10-CM

## 2021-09-20 DIAGNOSIS — Z7902 Long term (current) use of antithrombotics/antiplatelets: Secondary | ICD-10-CM

## 2021-09-20 DIAGNOSIS — Z86010 Personal history of colonic polyps: Secondary | ICD-10-CM | POA: Diagnosis not present

## 2021-09-20 NOTE — Patient Instructions (Signed)
If you are age 65 or older, your body mass index should be between 23-30. Your Body mass index is 28.15 kg/m. If this is out of the aforementioned range listed, please consider follow up with your Primary Care Provider.  If you are age 3 or younger, your body mass index should be between 19-25. Your Body mass index is 28.15 kg/m. If this is out of the aformentioned range listed, please consider follow up with your Primary Care Provider.   ________________________________________________________  The [place] [facility] providers would like to encourage you to use [facility] to communicate with providers for non-urgent requests or questions.  Due to long hold times on the telephone, sending your provider a message by [facility] may be a faster and more efficient way to get a response.  Please allow 48 business hours for a response.  Please remember that this is for non-urgent requests.  _______________________________________________________    It was a pleasure to see you today!  Thank you for trusting me with your gastrointestinal care!

## 2021-09-20 NOTE — Progress Notes (Addendum)
[facility] progress note:  History: Theresa Harrington 09/20/2021  Referring provider: [name], MD  Reason for consult/chief complaint: No chief complaint on file. Barrett's esophagus with dysplasia, status post ablation in 2020  Subjective  HPI: I last saw Theresa Harrington in March 2020 for an upper endoscopy that discovered Barrett's esophagus with "at least high-grade dysplasia" and an area of nodularity at the GE junction. She had upper endoscopy with Dr. Rush Landmark in June 2020 with plans for an EMR of that area, though nodular mucosa was no longer present.  Repeat biopsies confirmed Barrett's with high-grade dysplasia.  In July 2020 she underwent RFA of the Barrett's, and had further RFA in October 2020 and again in April 2021 (though no Barrett's was seen on the biopsies taken from that EGD).  In March of this year her name came up for endoscopic recall, at which time I did chart review and saw that she was under treatment for A-fib with a planned ablation.  I sent her a chart message recommending that I see her in the office toward the end of this month.  Her ablation was performed on 08/02/2021, and the follow-up office note from 08/11/2021 was reviewed indicating she was doing well in sinus rhythm with plans to complete by systolic 10 mg daily and Xarelto 20 mg daily for 3 months. Last colonoscopy February 2020 with multiple adenomatous polyps, including 1 over 10 mm.  [name] says she is feeling well from a digestive standpoint, denying heartburn dysphagia odynophagia, nausea vomiting altered bowel habits or rectal bleeding. She is feeling well since the ablation and is due to see cardiology next week.  ROS:  Review of Systems  Constitutional:  Negative for appetite change and unexpected weight change.  HENT:  Negative for mouth sores and voice change.   Eyes:  Negative for pain and redness.  Respiratory:  Negative for cough and shortness of breath.   Cardiovascular:   Negative for chest pain and palpitations.  Genitourinary:  Negative for dysuria and hematuria.  Musculoskeletal:  Negative for arthralgias and myalgias.  Skin:  Negative for pallor and rash.  Neurological:  Negative for weakness and headaches.  Hematological:  Negative for adenopathy.    Past Medical History: Past Medical History:  Diagnosis Date   Allergy    Anxiety    Pt. denies   Arthritis    right index figer   Asymptomatic cholelithiasis    Atherosclerosis of aorta (HCC)    Cataract    Clotting disorder ([place])    Gallstones 06/2013   GERD (gastroesophageal reflux disease)    History of kidney stones    lithotrispy   Hyperlipidemia    Hypertension    Hypothyroidism    no meds   Iron deficiency anemia    PAD (peripheral artery disease) (HCC)    PAF (paroxysmal atrial fibrillation) (HCC)    Peripheral arterial occlusive disease (HCC)    lower extremities   Pneumonia    PONV (postoperative nausea and vomiting)    Right ureteral stone    Type 2 diabetes mellitus ([place])    Type   Vitamin B 12 deficiency    Wears glasses      Past Surgical History: Past Surgical History:  Procedure Laterality Date   ABDOMINAL AORTOGRAM W/LOWER EXTREMITY N/A 03/09/2020   Procedure: ABDOMINAL AORTOGRAM W/LOWER EXTREMITY;  Surgeon: Serafina Mitchell, MD;  Location: [place] [facility];  Service: Cardiovascular;  Laterality: N/A;   AMPUTATION Left 07/16/2014   Procedure:  LEFT SECOND TOE AMPUTATION;  Surgeon: Serafina Mitchell, MD;  Location: [place];  Service: Vascular;  Laterality: Left;  With Nerve block   ATRIAL FIBRILLATION ABLATION N/A 08/02/2021   Procedure: ATRIAL FIBRILLATION ABLATION;  Surgeon: Constance Haw, MD;  Location: [place] [facility];  Service: Cardiovascular;  Laterality: N/A;   AUGMENTATION MAMMAPLASTY     BELPHAROPTOSIS REPAIR     eyelid lift   BIOPSY  10/14/2018   Procedure: BIOPSY;  Surgeon: Rush Landmark Telford Nab., MD;  Location: [place];  Service:  Gastroenterology;;   BIOPSY  08/04/2019   Procedure: BIOPSY;  Surgeon: Irving Copas., MD;  Location: [place];  Service: Gastroenterology;;   CARDIOVERSION N/A 09/19/2019   Procedure: CARDIOVERSION;  Surgeon: Buford Dresser, MD;  Location: [place];  Service: Cardiovascular;  Laterality: N/A;   COLONOSCOPY     COMBINED AUGMENTATION MAMMAPLASTY AND ABDOMINOPLASTY  2009   W/  BILATERAL  THIGH LIFT   CYSTO/  RIGHT URETERAL STENT PLACEMENT  12-30-2010   CYSTOSCOPY WITH RETROGRADE PYELOGRAM, URETEROSCOPY AND STENT PLACEMENT Right 10/07/2013   Procedure: CYSTOSCOPY WITH RETROGRADE PYELOGRAM, right URETEROSCOPY AND STENT PLACEMENT, stone extraction;  Surgeon: Arvil Persons, MD;  Location: [facility];  Service: Urology;  Laterality: Right;   CYSTOSCOPY WITH STENT PLACEMENT Right 06/04/2013   Procedure: CYSTOSCOPY WITH STENT PLACEMENT;  Surgeon: Franchot Gallo, MD;  Location: [facility];  Service: Urology;  Laterality: Right;   DILATATION & CURETTAGE/HYSTEROSCOPY WITH MYOSURE N/A 04/27/2015   Procedure: DILATATION & CURETTAGE/HYSTEROSCOPY WITH MYOSURE;  Surgeon: Terrance Mass, MD;  Location: [facility];  Service: Gynecology;  Laterality: N/A;   ENDARTERECTOMY FEMORAL Left 06/08/2014   Procedure: Left Leg Common Femoral and External Iliac  Endartarectomy with patch Angioplasty;  Surgeon: Rosetta Posner, MD;  Location: [facility];  Service: Vascular;  Laterality: Left;   ESOPHAGOGASTRODUODENOSCOPY N/A 10/14/2018   Procedure: ESOPHAGOGASTRODUODENOSCOPY (EGD);  Surgeon: Irving Copas., MD;  Location: [place];  Service: Gastroenterology;  Laterality: N/A;   ESOPHAGOGASTRODUODENOSCOPY (EGD) WITH PROPOFOL N/A 11/06/2018   Procedure: ESOPHAGOGASTRODUODENOSCOPY (EGD) WITH PROPOFOL;  Surgeon: Rush Landmark Telford Nab., MD;  Location[facility];  Service: Gastroenterology;  Laterality: N/A;  RFA   ESOPHAGOGASTRODUODENOSCOPY (EGD) WITH PROPOFOL N/A 02/12/2019   Procedure:  ESOPHAGOGASTRODUODENOSCOPY (EGD) WITH PROPOFOL;  Surgeon: Rush Landmark Telford Nab., MD;  Location[facility];  Service: Gastroenterology;  Laterality: N/A;   ESOPHAGOGASTRODUODENOSCOPY (EGD) WITH PROPOFOL N/A 08/04/2019   Procedure: ESOPHAGOGASTRODUODENOSCOPY (EGD) WITH PROPOFOL;  Surgeon: Rush Landmark Telford Nab., MD;  Location: [place];  Service: Gastroenterology;  Laterality: N/A;  WITH RFA   EXTRACORPOREAL SHOCK WAVE LITHOTRIPSY Right 08-04-2013//   06-23-2013//   01-16-2011   EYE SURGERY Bilateral    cataract   FEMORAL-POPLITEAL BYPASS GRAFT Left 06/08/2014   Procedure: Left Leg Femoral -Popliteal Bypass Graft;  Surgeon: Rosetta Posner, MD;  Location: [facility];  Service: Vascular;  Laterality: Left;   FEMORAL-POPLITEAL BYPASS GRAFT Left 06/09/2014   Procedure: Left Femoral and Popliteal Exposure; Left Femoral to Anterior Tibial Bypass Graft using Propaten 7m by 80cm Goretex Graft; Left Tibial Endarterectomy; Left Femoraland Popliteal Thrombectomy ;  Surgeon: VSerafina Mitchell MD;  Location: [place]  Service: Vascular;  Laterality: Left;   GI RADIOFREQUENCY ABLATION N/A 11/06/2018   Procedure: GI RADIOFREQUENCY ABLATION;  Surgeon: MIrving Copas, MD;  [facility];  Service: Gastroenterology;  Laterality: N/A;   GI RADIOFREQUENCY ABLATION N/A 02/12/2019   Procedure: GI RADIOFREQUENCY ABLATION;  Surgeon: MRush LandmarkGTelford Nab, MD;  [facility];  Service: Gastroenterology;  Laterality: N/A;   GI RADIOFREQUENCY ABLATION N/A 08/04/2019   Procedure: GI RADIOFREQUENCY ABLATION;  Surgeon: Rush Landmark Telford Nab., MD;  Location: [place];  Service: Gastroenterology;  Laterality: N/A;   HOLMIUM LASER APPLICATION Right 06/04/5571   Procedure: HOLMIUM LASER APPLICATION;  Surgeon: Arvil Persons, MD;  Location[facility];  Service: Urology;  Laterality: Right;   KIDNEY STONE SURGERY  April 2015   1-2 stones   LAPAROSCOPIC GASTRIC BANDING  05-29-2005   LITHOTRIPSY   2-3 times   LOWER EXTREMITY ANGIOGRAM N/A 06/04/2014   Procedure: LOWER EXTREMITY ANGIOGRAM;  Surgeon: Serafina Mitchell, MD;  Location: [facility];  Service: Cardiovascular;  Laterality: N/A;   ORIF FIFTH METACARPAL FX  RIGHT HAND  04-21-2002   REVISION AND RE-SITING LAP-BAND PORT  04-08-2010   W/  UPPER EGD   RIGHT KNEE PATELLECTOMY W/ REPAIR OF EXTENSOR MECHANISM  04-14-2002   TEE WITHOUT CARDIOVERSION N/A 12/24/2019   Procedure: TRANSESOPHAGEAL ECHOCARDIOGRAM (TEE);  Surgeon: Sueanne Margarita, MD;  Location: [facility];  Service: Cardiovascular;  Laterality: N/A;   Toenail removed Left Jan. 21, 2016   2nd toenail-  Dr. Barkley Bruns     Family History: Family History  Problem Relation Age of Onset   Diabetes Father    Heart disease Father    Deep vein thrombosis Father    Hyperlipidemia Father    Diabetes Sister    Heart disease Sister    Deep vein thrombosis Sister    Hyperlipidemia Sister    Diabetes Brother    Heart disease Brother    Cancer Brother    Hyperlipidemia Brother    Colon cancer Maternal Aunt    Diabetes Brother    Diabetes Brother    Kidney disease Mother    Hyperlipidemia Mother    Other Mother        AAA   and    Amputation   Arthritis Other    Cancer Other        colon   Hypertension Other    Stroke Other    Esophageal cancer Neg Hx    Stomach cancer Neg Hx    Inflammatory bowel disease Neg Hx    Liver disease Neg Hx    Pancreatic cancer Neg Hx     Social History: Social History   Socioeconomic History   Marital status: Single    Spouse name: Not on file   Number of children: Not on file   Years of education: Not on file   Highest education level: Not on file  Occupational History   Occupation: Quarry manager: [employer]  Tobacco Use   Smoking status: Former    Packs/day: 1.00    Years: 28.00    Pack years: 28.00    Types: Cigarettes    Quit date: 09/05/2001    Years since quitting: 20.0   Smokeless tobacco: Never  Vaping Use    Vaping Use: Never used  Substance and Sexual Activity   Alcohol use: No    Alcohol/week: 0.0 standard drinks   Drug use: No   Sexual activity: Yes  Other Topics Concern   Not on file  Social History Narrative   Regular exercise- yes   Social Determinants of Health   Financial Resource Strain: Not on file  Food Insecurity: Not on file  Transportation Needs: Not on file  Physical Activity: Not on file  Stress: Not on file  Social Connections:  Not on file    Allergies: Allergies  Allergen Reactions   Amlodipine Swelling   Atorvastatin Other (See Comments)    Muscle aches    Ampicillin Nausea And Vomiting and Other (See Comments)   Codeine Nausea And Vomiting    Ask patient   Lisinopril Cough        Penicillins Nausea And Vomiting    Did it involve swelling of the face/tongue/throat, SOB, or low BP? No Did it involve sudden or severe rash/hives, skin peeling, or any reaction on the inside of your mouth or nose? No Did you need to seek medical attention at a hospital or doctor's office? No When did it last happen?      20+ years If all above answers are "NO", may proceed with cephalosporin use.    Januvia [Sitagliptin] Nausea And Vomiting    Outpatient Meds: Current Outpatient Medications  Medication Sig Dispense Refill   Blood Glucose Monitoring Suppl (ONETOUCH VERIO IQ SYSTEM) w/Device KIT USE TO CHECK SUGAR DAILY 1 kit 0   cetirizine (ZYRTEC) 10 MG tablet Take 10 mg by mouth daily as needed for allergies.     dorzolamide-timolol (COSOPT) 22.3-6.8 MG/ML ophthalmic solution Place 1 drop into the right eye 2 (two) times daily.      Empagliflozin-metFORMIN HCl ER (SYNJARDY XR) 12.08-998 MG TB24 Take 1 tablet by mouth daily. Via [employer] patient assistance 90 tablet 1   ezetimibe (ZETIA) 10 MG tablet TAKE ONE TABLET BY MOUTH EVERYDAY AT BEDTIME 90 tablet 1   Glucagon (GVOKE HYPOPEN 2-PACK) 1 MG/0.2ML SOAJ Inject 1 Act into the skin daily as needed. 2 mL 5   Insulin  Glargine (BASAGLAR KWIKPEN) 100 UNIT/ML Inject 50 Units into the skin at bedtime. [place] patient assistance (Patient taking differently: Inject 40-42 Units into the skin at bedtime. [place] patient assistance) 30 mL 2   insulin lispro (HUMALOG) 100 UNIT/ML injection Inject 10-14 Units into the skin 3 (three) times daily before meals. [place] patient assistance Sliding scale     Insulin Pen Needle (PEN NEEDLES) 32G X 4 MM MISC Inject 1 pen as directed 4 (four) times daily. Use to inject levemir or novolog as directed. 400 each 3   latanoprost (XALATAN) 0.005 % ophthalmic solution Place 1 drop into both eyes at bedtime.      losartan (COZAAR) 100 MG tablet TAKE ONE TABLET BY MOUTH EVERYDAY AT BEDTIME 90 tablet 1   magnesium oxide (MAG-OX) 400 (240 Mg) MG tablet TAKE ONE TABLET BY MOUTH EVERYDAY AT BEDTIME 90 tablet 1   nebivolol (BYSTOLIC) 10 MG tablet Take 1 tablet (10 mg total) by mouth daily. 90 tablet 3   omeprazole (PRILOSEC) 40 MG capsule TAKE ONE CAPSULE BY MOUTH EVERY MORNING and TAKE ONE CAPSULE BY MOUTH EVERY EVENING 180 capsule 1   ONETOUCH VERIO test strip Use to check blood sugar twice daily. 200 strip 3   pravastatin (PRAVACHOL) 40 MG tablet TAKE ONE TABLET BY MOUTH EVERYDAY AT BEDTIME 90 tablet 1   Semaglutide, 1 MG/DOSE, (OZEMPIC, 1 MG/DOSE,) 4 MG/3ML SOPN Inject 1 mg into the skin once a week. Via [employer] patient assistance 9 mL 1   XARELTO 20 MG TABS tablet Take 1 tablet by mouth once daily 90 tablet 0   No current facility-administered medications for this visit.      ___________________________________________________________________ Objective   Exam:  BP 90/60   Pulse 67   Ht '5\' 4"'  (1.626 m)   Wt  164 lb (74.4 kg)   LMP  (LMP Unknown)   BMI 28.15 kg/m  Wt Readings from Last 3 Encounters:  09/20/21 164 lb (74.4 kg)  08/11/21 169 lb 6.4 oz (76.8 kg)  08/02/21 168 lb (76.2 kg)    General: Well-appearing Eyes: sclera anicteric, no  redness ENT: oral mucosa moist without lesions, no cervical or supraclavicular lymphadenopathy CV: RRR without murmur, S1/S2, no JVD, no peripheral edema Resp: clear to auscultation bilaterally, normal RR and effort noted GI: soft, no tenderness, with active bowel sounds. No guarding or palpable organomegaly noted. Skin; warm and dry, no rash or jaundice noted Neuro: awake, alert and oriented x 3. Normal gross motor function and fluent speech  Labs:  Previous endoscopic and pathology reports as noted above.  Assessment: Encounter Diagnoses  Name Primary?   History of Barrett's esophagus Yes   History of colonic polyps    Long term (current) use of antithrombotics/antiplatelets   Atrial fibrillation, status post ablation  She is doing well from a cardiac standpoint, still in sinus rhythm after ablation about 6 weeks ago, sounds like she will remain on [place] for at least 3 months from the date of ablation.  She hopes to find that out for certain when she follows up with cardiology next week.  She is due for both upper endoscopy to perform Barrett's surveillance as well as colonoscopy for polyp surveillance.  She would need to be off Adwolf for at least 24 hours before procedure, which she cannot do until least sometime in early July.  I will forward my note to cardiology so they know that is in the works and can comment on how long she will be on oral anticoagulation.  If she is to continue it longer than that 62-monthperiod, we will need to know when she could have a total 2 to 3-day hold (, before during and after procedures) with acceptable risk.  In addition, I will communicate with Dr. MRush Landmarkto see if he would like me to perform the procedures to see if any residual Barrett's, or if he would like to do so for the possibility of ablation in case persistent Barrett's is found.  After that, we will communicate with Kaylena by phone to make arrangements for endoscopies.    30 minutes were  spent on this encounter (including chart review, history/exam, counseling/coordination of care, and documentation) > 50% of that time was spent on counseling and coordination of care.  (2 years since I last saw patient, recent cardiac issues with records to review and previous endoscopic reports)   HNelida MeuseIII  CC: Referring provider noted above

## 2021-09-28 ENCOUNTER — Telehealth: Payer: Self-pay

## 2021-09-28 NOTE — Telephone Encounter (Signed)
Shipment received for Ozempic, patient notified. Injections along with packing slip placed in back refrigerator.-AW

## 2021-09-30 ENCOUNTER — Telehealth: Payer: Self-pay | Admitting: Gastroenterology

## 2021-09-30 NOTE — Telephone Encounter (Signed)
I saw this patient in the office last week and told her we will contact her with plans for her EGD and colonoscopy after I had been in touch with her cardiologist and Dr. Rush Landmark.  (You can read my note for details)  Dr. Rush Landmark recommended that Theresa Harrington have her EGD and colonoscopy with me (which can be done in the [facility]). Dr. Curt Bears, her cardiologist who recently performed A-fib ablation, recommended the following regarding her anticoagulation:  "Harrington, Theresa Doyne, MD  Theresa Meuse III, MD She will need 3 months of uninterrupted eliquis post ablation. After that can safely stop for procedures. Let me know if there is anything more you need from me to help. Thanks. "  ____________________  Herbert Seta,   Please contact this patient to arrange EGD and colonoscopy with me for Barrett's surveillance and history of colon polyps.  The earliest that these could be is mid July, which would be 3 months from her A-fib ablation.  Both the July and August schedules are now available.  She is diabetic on both oral meds and insulin, so needs to be an a.m. procedure with usual preprocedure diabetic medication management.  Generic Suprep   Last dose of Eliquis the morning of the day prior to procedure (prep day)  Let me know if you have any questions.  -H. Danis

## 2021-09-30 NOTE — Telephone Encounter (Signed)
Called and spoke with patient regarding Dr. Loletha Carrow' recommendations. Pt has been scheduled for an in person pre-visit on Wednesday, 11/23/21 at 9 am. Pt knows to check in on the 2nd floor for pre-visit appt. Pt is scheduled for EGD/colon in the [place] with Dr. Loletha Carrow on Wednesday, 12/07/21 at 10 am. Pt knows that she will need to arrive on the 4th floor by 9 am with a care partner. Pt is aware that I will mail her appt information, she confirmed the address on file. Pt also mentioned that she is on Xarelto, not Eliquis. Pt did not have any concerns at the end of the call.

## 2021-10-03 NOTE — Telephone Encounter (Signed)
Contacted patient as a reminder for pick up for Ozempic, no answer. Left message with details, this is the second attempt.

## 2021-10-05 NOTE — Telephone Encounter (Signed)
Patient came to the office to pick up [place] on 10/03/21.

## 2021-10-07 ENCOUNTER — Ambulatory Visit: Payer: PPO | Admitting: Cardiology

## 2021-10-07 ENCOUNTER — Encounter: Payer: Self-pay | Admitting: Cardiology

## 2021-10-07 VITALS — BP 136/76 | HR 62 | Ht 64.5 in | Wt 167.0 lb

## 2021-10-07 DIAGNOSIS — I4819 Other persistent atrial fibrillation: Secondary | ICD-10-CM | POA: Diagnosis not present

## 2021-10-07 NOTE — Progress Notes (Signed)
Electrophysiology Office Note   Date:  10/07/2021   ID:  Theresa Harrington, DOB 1955/07/17, MRN [id number]  PCP:  [name], MD  Cardiologist:  Marlou Porch Primary Electrophysiologist:  Nakhi Choi Meredith Leeds, MD    Chief Complaint: AF   History of Present Illness: Theresa Harrington is a 66 y.o. female who is being seen today for the evaluation of AF at the request of [name], MD. Presenting today for electrophysiology evaluation.  She has a history significant for hypertension, hyperlipidemia, peripheral arterial disease, atrial fibrillation, type 2 diabetes.  She has mitral calcification with mild mitral regurgitation.  She was having more frequent episodes of atrial fibrillation.  She is now status post ablation 08/11/2021.  Today, denies symptoms of palpitations, chest pain, shortness of breath, orthopnea, PND, lower extremity edema, claudication, dizziness, presyncope, syncope, bleeding, or neurologic sequela. The patient is tolerating medications without difficulties.  Ablation she has done well.  She is overall happy with her control.  She has had no further episodes of atrial fibrillation.  Past Medical History:  Diagnosis Date   Allergy    Anxiety    Pt. denies   Arthritis    right index figer   Asymptomatic cholelithiasis    Atherosclerosis of aorta (HCC)    Cataract    Clotting disorder ([place])    Gallstones 06/2013   GERD (gastroesophageal reflux disease)    History of kidney stones    lithotrispy   Hyperlipidemia    Hypertension    Hypothyroidism    no meds   Iron deficiency anemia    PAD (peripheral artery disease) (HCC)    PAF (paroxysmal atrial fibrillation) (HCC)    Peripheral arterial occlusive disease (HCC)    lower extremities   Pneumonia    PONV (postoperative nausea and vomiting)    Right ureteral stone    Type 2 diabetes mellitus ([place])    Type   Vitamin B 12 deficiency    Wears glasses    Past Surgical History:  Procedure Laterality Date   ABDOMINAL  AORTOGRAM W/LOWER EXTREMITY N/A 03/09/2020   Procedure: ABDOMINAL AORTOGRAM W/LOWER EXTREMITY;  Surgeon: Serafina Mitchell, MD;  Location: [place] [facility];  Service: Cardiovascular;  Laterality: N/A;   AMPUTATION Left 07/16/2014   Procedure: LEFT SECOND TOE AMPUTATION;  Surgeon: Serafina Mitchell, MD;  Location: [place];  Service: Vascular;  Laterality: Left;  With Nerve block   ATRIAL FIBRILLATION ABLATION N/A 08/02/2021   Procedure: ATRIAL FIBRILLATION ABLATION;  Surgeon: Constance Haw, MD;  Location: [place] [facility];  Service: Cardiovascular;  Laterality: N/A;   AUGMENTATION MAMMAPLASTY     BELPHAROPTOSIS REPAIR     eyelid lift   BIOPSY  10/14/2018   Procedure: BIOPSY;  Surgeon: Rush Landmark Telford Nab., MD;  Location: [place];  Service: Gastroenterology;;   BIOPSY  08/04/2019   Procedure: BIOPSY;  Surgeon: Irving Copas., MD;  Location: [place];  Service: Gastroenterology;;   CARDIOVERSION N/A 09/19/2019   Procedure: CARDIOVERSION;  Surgeon: Buford Dresser, MD;  Location: [place];  Service: Cardiovascular;  Laterality: N/A;   COLONOSCOPY     COMBINED AUGMENTATION MAMMAPLASTY AND ABDOMINOPLASTY  2009   W/  BILATERAL  THIGH LIFT   CYSTO/  RIGHT URETERAL STENT PLACEMENT  12-30-2010   CYSTOSCOPY WITH RETROGRADE PYELOGRAM, URETEROSCOPY AND STENT PLACEMENT Right 10/07/2013   Procedure: CYSTOSCOPY WITH RETROGRADE PYELOGRAM, right URETEROSCOPY AND STENT PLACEMENT, stone extraction;  Surgeon: Arvil Persons, MD;  Location: [place];  Service: Urology;  Laterality: Right;   CYSTOSCOPY WITH STENT PLACEMENT Right 06/04/2013   Procedure: CYSTOSCOPY WITH STENT PLACEMENT;  Surgeon: Franchot Gallo, MD;  Location: [facility];  Service: Urology;  Laterality: Right;   DILATATION & CURETTAGE/HYSTEROSCOPY WITH MYOSURE N/A 04/27/2015   Procedure: DILATATION & CURETTAGE/HYSTEROSCOPY WITH MYOSURE;  Surgeon: Terrance Mass, MD;  Location: [facility];  Service: Gynecology;   Laterality: N/A;   ENDARTERECTOMY FEMORAL Left 06/08/2014   Procedure: Left Leg Common Femoral and External Iliac  Endartarectomy with patch Angioplasty;  Surgeon: Rosetta Posner, MD;  Location: [facility];  Service: Vascular;  Laterality: Left;   ESOPHAGOGASTRODUODENOSCOPY N/A 10/14/2018   Procedure: ESOPHAGOGASTRODUODENOSCOPY (EGD);  Surgeon: Irving Copas., MD;  Location: [place];  Service: Gastroenterology;  Laterality: N/A;   ESOPHAGOGASTRODUODENOSCOPY (EGD) WITH PROPOFOL N/A 11/06/2018   Procedure: ESOPHAGOGASTRODUODENOSCOPY (EGD) WITH PROPOFOL;  Surgeon: Rush Landmark Telford Nab., MD;  Location: [facility];  Service: Gastroenterology;  Laterality: N/A;  RFA   ESOPHAGOGASTRODUODENOSCOPY (EGD) WITH PROPOFOL N/A 02/12/2019   Procedure: ESOPHAGOGASTRODUODENOSCOPY (EGD) WITH PROPOFOL;  Surgeon: Rush Landmark Telford Nab., MD;  Location: [facility];  Service: Gastroenterology;  Laterality: N/A;   ESOPHAGOGASTRODUODENOSCOPY (EGD) WITH PROPOFOL N/A 08/04/2019   Procedure: ESOPHAGOGASTRODUODENOSCOPY (EGD) WITH PROPOFOL;  Surgeon: Rush Landmark Telford Nab., MD;  Location: [place];  Service: Gastroenterology;  Laterality: N/A;  WITH RFA   EXTRACORPOREAL SHOCK WAVE LITHOTRIPSY Right 08-04-2013//   06-23-2013//   01-16-2011   EYE SURGERY Bilateral    cataract   FEMORAL-POPLITEAL BYPASS GRAFT Left 06/08/2014   Procedure: Left Leg Femoral -Popliteal Bypass Graft;  Surgeon: Rosetta Posner, MD;  Location[facility];  Service: Vascular;  Laterality: Left;   FEMORAL-POPLITEAL BYPASS GRAFT Left 06/09/2014   Procedure: Left Femoral and Popliteal Exposure; Left Femoral to Anterior Tibial Bypass Graft using Propaten 59mm by 80cm Goretex Graft; Left Tibial Endarterectomy; Left Femoraland Popliteal Thrombectomy ;  Surgeon: Serafina Mitchell, MD;  Location: [place];  Service: Vascular;  Laterality: Left;   GI RADIOFREQUENCY ABLATION N/A 11/06/2018   Procedure: GI RADIOFREQUENCY ABLATION;  Surgeon: Irving Copas., MD;   Location[facility];  Service: Gastroenterology;  Laterality: N/A;   GI RADIOFREQUENCY ABLATION N/A 02/12/2019   Procedure: GI RADIOFREQUENCY ABLATION;  Surgeon: Rush Landmark Telford Nab., MD;  Location[facility];  Service: Gastroenterology;  Laterality: N/A;   GI RADIOFREQUENCY ABLATION N/A 08/04/2019   Procedure: GI RADIOFREQUENCY ABLATION;  Surgeon: Rush Landmark Telford Nab., MD;  Location: [name];  Service: Gastroenterology;  Laterality: N/A;   HOLMIUM LASER APPLICATION Right 07/01/4399   Procedure: HOLMIUM LASER APPLICATION;  Surgeon: Arvil Persons, MD;  Location: [facility];  Service: Urology;  Laterality: Right;   KIDNEY STONE SURGERY  April 2015   1-2 stones   LAPAROSCOPIC GASTRIC BANDING  05-29-2005   LITHOTRIPSY  2-3 times   LOWER EXTREMITY ANGIOGRAM N/A 06/04/2014   Procedure: LOWER EXTREMITY ANGIOGRAM;  Surgeon: Serafina Mitchell, MD;  Location: [facility];  Service: Cardiovascular;  Laterality: N/A;   ORIF FIFTH METACARPAL FX  RIGHT HAND  04-21-2002   REVISION AND RE-SITING LAP-BAND PORT  04-08-2010   W/  UPPER EGD   RIGHT KNEE PATELLECTOMY W/ REPAIR OF EXTENSOR MECHANISM  04-14-2002   TEE WITHOUT CARDIOVERSION N/A 12/24/2019   Procedure: TRANSESOPHAGEAL ECHOCARDIOGRAM (TEE);  Surgeon: Sueanne Margarita, MD;  Location: [facility];  Service: Cardiovascular;  Laterality: N/A;   Toenail removed Left Jan. 21, 2016   2nd toenail-  Dr. Barkley Bruns     Current Outpatient  Medications  Medication Sig Dispense Refill   Blood Glucose Monitoring Suppl (ONETOUCH VERIO IQ SYSTEM) w/Device KIT USE TO CHECK SUGAR DAILY 1 kit 0   cetirizine (ZYRTEC) 10 MG tablet Take 10 mg by mouth daily as needed for allergies.     dorzolamide-timolol (COSOPT) 22.3-6.8 MG/ML ophthalmic solution Place 1 drop into the right eye 2 (two) times daily.      Empagliflozin-metFORMIN HCl ER (SYNJARDY XR) 12.08-998 MG TB24 Take 1 tablet by mouth daily. Via [employer] patient assistance 90 tablet 1    ezetimibe (ZETIA) 10 MG tablet TAKE ONE TABLET BY MOUTH EVERYDAY AT BEDTIME 90 tablet 1   Glucagon (GVOKE HYPOPEN 2-PACK) 1 MG/0.2ML SOAJ Inject 1 Act into the skin daily as needed. 2 mL 5   Insulin Glargine (BASAGLAR KWIKPEN) 100 UNIT/ML Inject 50 Units into the skin at bedtime. [place] patient assistance (Patient taking differently: Inject 40-42 Units into the skin at bedtime. [place] patient assistance) 30 mL 2   insulin lispro (HUMALOG) 100 UNIT/ML injection Inject 10-14 Units into the skin 3 (three) times daily before meals. [place] patient assistance Sliding scale     Insulin Pen Needle (PEN NEEDLES) 32G X 4 MM MISC Inject 1 pen as directed 4 (four) times daily. Use to inject levemir or novolog as directed. 400 each 3   latanoprost (XALATAN) 0.005 % ophthalmic solution Place 1 drop into both eyes at bedtime.      losartan (COZAAR) 100 MG tablet TAKE ONE TABLET BY MOUTH EVERYDAY AT BEDTIME 90 tablet 1   magnesium oxide (MAG-OX) 400 (240 Mg) MG tablet TAKE ONE TABLET BY MOUTH EVERYDAY AT BEDTIME 90 tablet 1   nebivolol (BYSTOLIC) 10 MG tablet Take 1 tablet (10 mg total) by mouth daily. 90 tablet 3   omeprazole (PRILOSEC) 40 MG capsule TAKE ONE CAPSULE BY MOUTH EVERY MORNING and TAKE ONE CAPSULE BY MOUTH EVERY EVENING 180 capsule 1   ONETOUCH VERIO test strip Use to check blood sugar twice daily. 200 strip 3   pravastatin (PRAVACHOL) 40 MG tablet TAKE ONE TABLET BY MOUTH EVERYDAY AT BEDTIME 90 tablet 1   Semaglutide, 1 MG/DOSE, (OZEMPIC, 1 MG/DOSE,) 4 MG/3ML SOPN Inject 1 mg into the skin once a week. Via [employer] patient assistance 9 mL 1   XARELTO 20 MG TABS tablet Take 1 tablet by mouth once daily 90 tablet 0   No current facility-administered medications for this visit.    Allergies:   Amlodipine, Atorvastatin, Ampicillin, Codeine, Lisinopril, Penicillins, and Januvia [sitagliptin]   Social History:  The patient  reports that she quit smoking about 20 years ago.  Her smoking use included cigarettes. She has a 28.00 pack-year smoking history. She has never used smokeless tobacco. She reports that she does not drink alcohol and does not use drugs.   Family History:  The patient's family history includes Arthritis in an other family member; Cancer in her brother and another family member; Colon cancer in her maternal aunt; Deep vein thrombosis in her father and sister; Diabetes in her brother, brother, brother, father, and sister; Heart disease in her brother, father, and sister; Hyperlipidemia in her brother, father, mother, and sister; Hypertension in an other family member; Kidney disease in her mother; Other in her mother; Stroke in an other family member.   ROS:  Please see the history of present illness.   Otherwise, review of systems is positive for none.   All other systems are reviewed and negative.  PHYSICAL EXAM: VS:  BP 136/76   Pulse 62   Ht 5' 4.5" (1.638 m)   Wt 167 lb (75.8 kg)   LMP  (LMP Unknown)   SpO2 98%   BMI 28.22 kg/m  , BMI Body mass index is 28.22 kg/m. GEN: Well nourished, well developed, in no acute distress  HEENT: normal  Neck: no JVD, carotid bruits, or masses Cardiac: RRR; no murmurs, rubs, or gallops,no edema  Respiratory:  clear to auscultation bilaterally, normal work of breathing GI: soft, nontender, nondistended, + BS MS: no deformity or atrophy  Skin: warm and dry Neuro:  Strength and sensation are intact Psych: euthymic mood, full affect  EKG:  EKG is ordered today. Personal review of the ekg ordered shows sinus rhythm   Recent Labs: 12/30/2020: ALT 13 06/23/2021: TSH 0.51 07/15/2021: BUN 14; Creatinine, Ser 0.75; Hemoglobin 13.5; Platelets 214; Potassium 4.1; Sodium 139    Lipid Panel     Component Value Date/Time   CHOL 171 12/30/2020 1046   TRIG 130.0 12/30/2020 1046   HDL 55.70 12/30/2020 1046   CHOLHDL 3 12/30/2020 1046   VLDL 26.0 12/30/2020 1046   LDLCALC 89 12/30/2020 1046   LDLDIRECT 160.2  03/01/2010 1001     Wt Readings from Last 3 Encounters:  10/07/21 167 lb (75.8 kg)  09/20/21 164 lb (74.4 kg)  08/11/21 169 lb 6.4 oz (76.8 kg)      Other studies Reviewed: Additional studies/ records that were reviewed today include: TTE 10/07/20  Review of the above records today demonstrates:   1. Left ventricular ejection fraction, by estimation, is 65 to 70%. The  left ventricle has normal function. The left ventricle has no regional  wall motion abnormalities. There is mild left ventricular hypertrophy.  Left ventricular diastolic parameters  are consistent with Grade II diastolic dysfunction (pseudonormalization).  Elevated left atrial pressure.   2. Right ventricular systolic function is normal. The right ventricular  size is normal. Tricuspid regurgitation signal is inadequate for assessing  PA pressure.   3. Left atrial size was severely dilated.   4. Right atrial size was mildly dilated.   5. The mitral valve is abnormal. Severe mitral annular calcification. No  evidence of mitral valve regurgitation. Mild to moderate mitral stenosis.  MG 72mmHg, MVA 1.4 cm^2 by continuity equation   6. The aortic valve is tricuspid. Aortic valve regurgitation is not  visualized. Mild to moderate aortic valve sclerosis/calcification is  present, without any evidence of aortic stenosis.   7. The inferior vena cava is normal in size with greater than 50%  respiratory variability, suggesting right atrial pressure of 3 mmHg.    ASSESSMENT AND PLAN:  1.  Persistent atrial fibrillation: Currently on diltiazem 846 mg daily, Bystolic 5 mg daily, Xarelto 20 mg daily.  CHA2DS2-VASc of 3.  Status post ablation 08/02/2021.  Has remained in sinus rhythm.  No changes.  2.  Mitral stenosis: Mild on most recent echo.  Plan per primary cardiology.  3.  Hypertension: Currently well controlled  Current medicines are reviewed at length with the patient today.   The patient does not have concerns  regarding her medicines.  The following changes were made today:  none  Labs/ tests ordered today include:  Orders Placed This Encounter  Procedures   EKG 12-Lead     Disposition:   FU 6 months  Signed, Tashae Inda Meredith Leeds, MD  10/07/2021 12:07 PM     [address]  [place] [address] [phone] (office) [phone] (fax)

## 2021-10-11 ENCOUNTER — Encounter: Payer: PPO | Admitting: Obstetrics

## 2021-11-03 DIAGNOSIS — Z8631 Personal history of diabetic foot ulcer: Secondary | ICD-10-CM | POA: Diagnosis not present

## 2021-11-03 DIAGNOSIS — E1151 Type 2 diabetes mellitus with diabetic peripheral angiopathy without gangrene: Secondary | ICD-10-CM | POA: Diagnosis not present

## 2021-11-03 DIAGNOSIS — S98132A Complete traumatic amputation of one left lesser toe, initial encounter: Secondary | ICD-10-CM | POA: Diagnosis not present

## 2021-11-03 DIAGNOSIS — E119 Type 2 diabetes mellitus without complications: Secondary | ICD-10-CM | POA: Diagnosis not present

## 2021-11-08 ENCOUNTER — Other Ambulatory Visit: Payer: Self-pay | Admitting: Internal Medicine

## 2021-11-08 DIAGNOSIS — I4891 Unspecified atrial fibrillation: Secondary | ICD-10-CM

## 2021-11-14 ENCOUNTER — Ambulatory Visit (INDEPENDENT_AMBULATORY_CARE_PROVIDER_SITE_OTHER): Payer: PPO | Admitting: Family Medicine

## 2021-11-14 ENCOUNTER — Encounter: Payer: Self-pay | Admitting: Family Medicine

## 2021-11-14 ENCOUNTER — Telehealth: Payer: Self-pay | Admitting: Internal Medicine

## 2021-11-14 VITALS — BP 102/74 | HR 65 | Temp 98.0°F | Ht 64.5 in | Wt 164.0 lb

## 2021-11-14 DIAGNOSIS — R52 Pain, unspecified: Secondary | ICD-10-CM | POA: Diagnosis not present

## 2021-11-14 DIAGNOSIS — J029 Acute pharyngitis, unspecified: Secondary | ICD-10-CM

## 2021-11-14 DIAGNOSIS — U071 COVID-19: Secondary | ICD-10-CM

## 2021-11-14 DIAGNOSIS — R051 Acute cough: Secondary | ICD-10-CM

## 2021-11-14 LAB — POC SOFIA SARS ANTIGEN FIA: SARS Coronavirus 2 Ag: POSITIVE — AB

## 2021-11-14 MED ORDER — MOLNUPIRAVIR 200 MG PO CAPS: 4.0000 | ORAL_CAPSULE | Freq: Two times a day (BID) | ORAL | 0 refills | Status: AC

## 2021-11-14 MED ORDER — HYDROCOD POLI-CHLORPHE POLI ER 10-8 MG/5ML PO SUER: 5.0000 mL | Freq: Two times a day (BID) | ORAL | 0 refills | Status: DC | PRN

## 2021-11-14 NOTE — Assessment & Plan Note (Signed)
Positive COVID test in the office.  Molnupiravir prescribed.  Discussed how to take medication and potential side effects.  Tussionex refilled per patient request for cough.  This has worked well for her in the past.  Discussed symptomatic management.  Counseling on CDC guidelines for quarantine. PDMP reviewed.

## 2021-11-14 NOTE — Telephone Encounter (Signed)
Pharmacy called in and states pt was rx chlorpheniramine-HYDROcodone (TUSSIONEX PENNKINETIC ER) 10-8 MG/5ML.   However, their pharmacy does not have it- pt requesting it be canceled and sent to [facility] in [place].   Phone #- [phone]

## 2021-11-14 NOTE — Patient Instructions (Signed)
Your COVID test today is positive. Quarantine until Friday 11/18/2021 and wear your mask for an additional 5 days.   I prescribed molnupiravir for you to take twice daily as prescribed.  I also prescribed cough medication which can be sedating so use caution when taking this.  You may also take over-the-counter Flonase, Zyrtec and Mucinex as needed. Tylenol for pain.  Stay well-hydrated.  Let us know if you are getting significantly worse or have any questions.    COVID-19: Quarantine and Isolation Quarantine If you were exposed Quarantine and stay away from others when you have been in close contact with someone who has COVID-19. Isolate If you are sick or test positive Isolate when you are sick or when you have COVID-19, even if you don't have symptoms. When to stay home Calculating quarantine The date of your exposure is considered day 0. Day 1 is the first full day after your last contact with a person who has had COVID-19. Stay home and away from other people for at least 5 days. Learn why CDC updated guidance for the general public. IF YOU were exposed to COVID-19 and are NOT  up to dateIF YOU were exposed to COVID-19 and are NOT on COVID-19 vaccinations Quarantine for at least 5 days Stay home Stay home and quarantine for at least 5 full days. Wear a well-fitting mask if you must be around others in your home. Do not travel. Get tested Even if you don't develop symptoms, get tested at least 5 days after you last had close contact with someone with COVID-19. After quarantine Watch for symptoms Watch for symptoms until 10 days after you last had close contact with someone with COVID-19. Avoid travel It is best to avoid travel until a full 10 days after you last had close contact with someone with COVID-19. If you develop symptoms Isolate immediately and get tested. Continue to stay home until you know the results. Wear a well-fitting mask around others. Take precautions  until day 10 Wear a well-fitting mask Wear a well-fitting mask for 10 full days any time you are around others inside your home or in public. Do not go to places where you are unable to wear a well-fitting mask. If you must travel during days 6-10, take precautions. Avoid being around people who are more likely to get very sick from COVID-19. IF YOU were exposed to COVID-19 and are  up to dateIF YOU were exposed to COVID-19 and are on COVID-19 vaccinations No quarantine You do not need to stay home unless you develop symptoms. Get tested Even if you don't develop symptoms, get tested at least 5 days after you last had close contact with someone with COVID-19. Watch for symptoms Watch for symptoms until 10 days after you last had close contact with someone with COVID-19. If you develop symptoms Isolate immediately and get tested. Continue to stay home until you know the results. Wear a well-fitting mask around others. Take precautions until day 10 Wear a well-fitting mask Wear a well-fitting mask for 10 full days any time you are around others inside your home or in public. Do not go to places where you are unable to wear a well-fitting mask. Take precautions if traveling Avoid being around people who are more likely to get very sick from COVID-19. IF YOU were exposed to COVID-19 and had confirmed COVID-19 within the past 90 days (you tested positive using a viral test) No quarantine You do not need to stay home unless  you develop symptoms. Watch for symptoms Watch for symptoms until 10 days after you last had close contact with someone with COVID-19. If you develop symptoms Isolate immediately and get tested. Continue to stay home until you know the results. Wear a well-fitting mask around others. Take precautions until day 10 Wear a well-fitting mask Wear a well-fitting mask for 10 full days any time you are around others inside your home or in public. Do not go to places where you are  unable to wear a well-fitting mask. Take precautions if traveling Avoid being around people who are more likely to get very sick from COVID-19. Calculating isolation Day 0 is your first day of symptoms or a positive viral test. Day 1 is the first full day after your symptoms developed or your test specimen was collected. If you have COVID-19 or have symptoms, isolate for at least 5 days. IF YOU tested positive for COVID-19 or have symptoms, regardless of vaccination status Stay home for at least 5 days Stay home for 5 days and isolate from others in your home. Wear a well-fitting mask if you must be around others in your home. Do not travel. Ending isolation if you had symptoms End isolation after 5 full days if you are fever-free for 24 hours (without the use of fever-reducing medication) and your symptoms are improving. Ending isolation if you did NOT have symptoms End isolation after at least 5 full days after your positive test. If you got very sick from COVID-19 or have a weakened immune system You should isolate for at least 10 days. Consult your doctor before ending isolation. Take precautions until day 10 Wear a well-fitting mask Wear a well-fitting mask for 10 full days any time you are around others inside your home or in public. Do not go to places where you are unable to wear a well-fitting mask. Do not travel Do not travel until a full 10 days after your symptoms started or the date your positive test was taken if you had no symptoms. Avoid being around people who are more likely to get very sick from COVID-19. Definitions Exposure Contact with someone infected with SARS-CoV-2, the virus that causes COVID-19, in a way that increases the likelihood of getting infected with the virus. Close contact A close contact is someone who was less than 6 feet away from an infected person (laboratory-confirmed or a clinical diagnosis) for a cumulative total of 15 minutes or more over a  24-hour period. For example, three individual 5-minute exposures for a total of 15 minutes. People who are exposed to someone with COVID-19 after they completed at least 5 days of isolation are not considered close contacts. Theresa Harrington is a strategy used to prevent transmission of COVID-19 by keeping people who have been in close contact with someone with COVID-19 apart from others. Who does not need to quarantine? If you had close contact with someone with COVID-19 and you are in one of the following groups, you do not need to quarantine. You are up to date with your COVID-19 vaccines. You had confirmed COVID-19 within the last 90 days (meaning you tested positive using a viral test). If you are up to date with COVID-19 vaccines, you should wear a well-fitting mask around others for 10 days from the date of your last close contact with someone with COVID-19 (the date of last close contact is considered day 0). Get tested at least 5 days after you last had close contact with someone with  COVID-19. If you test positive or develop COVID-19 symptoms, isolate from other people and follow recommendations in the Isolation section below. If you tested positive for COVID-19 with a viral test within the previous 90 days and subsequently recovered and remain without COVID-19 symptoms, you do not need to quarantine or get tested after close contact. You should wear a well-fitting mask around others for 10 days from the date of your last close contact with someone with COVID-19 (the date of last close contact is considered day 0). If you have COVID-19 symptoms, get tested and isolate from other people and follow recommendations in the Isolation section below. Who should quarantine? If you come into close contact with someone with COVID-19, you should quarantine if you are not up to date on COVID-19 vaccines. This includes people who are not vaccinated. What to do for quarantine Stay home and away from  other people for at least 5 days (day 0 through day 5) after your last contact with a person who has COVID-19. The date of your exposure is considered day 0. Wear a well-fitting mask when around others at home, if possible. For 10 days after your last close contact with someone with COVID-19, watch for fever (100.59F or greater), cough, shortness of breath, or other COVID-19 symptoms. If you develop symptoms, get tested immediately and isolate until you receive your test results. If you test positive, follow isolation recommendations. If you do not develop symptoms, get tested at least 5 days after you last had close contact with someone with COVID-19. If you test negative, you can leave your home, but continue to wear a well-fitting mask when around others at home and in public until 10 days after your last close contact with someone with COVID-19. If you test positive, you should isolate for at least 5 days from the date of your positive test (if you do not have symptoms). If you do develop COVID-19 symptoms, isolate for at least 5 days from the date your symptoms began (the date the symptoms started is day 0). Follow recommendations in the isolation section below. If you are unable to get a test 5 days after last close contact with someone with COVID-19, you can leave your home after day 5 if you have been without COVID-19 symptoms throughout the 5-day period. Wear a well-fitting mask for 10 days after your date of last close contact when around others at home and in public. Avoid people who are have weakened immune systems or are more likely to get very sick from COVID-19, and nursing homes and other high-risk settings, until after at least 10 days. If possible, stay away from people you live with, especially people who are at higher risk for getting very sick from COVID-19, as well as others outside your home throughout the full 10 days after your last close contact with someone with COVID-19. If you  are unable to quarantine, you should wear a well-fitting mask for 10 days when around others at home and in public. If you are unable to wear a mask when around others, you should continue to quarantine for 10 days. Avoid people who have weakened immune systems or are more likely to get very sick from COVID-19, and nursing homes and other high-risk settings, until after at least 10 days. See additional information about travel. Do not go to places where you are unable to wear a mask, such as restaurants and some gyms, and avoid eating around others at home and at work until  after 10 days after your last close contact with someone with COVID-19. After quarantine Watch for symptoms until 10 days after your last close contact with someone with COVID-19. If you have symptoms, isolate immediately and get tested. Quarantine in high-risk congregate settings In certain congregate settings that have high risk of secondary transmission (such as Systems analyst and detention facilities, homeless shelters, or cruise ships), CDC recommends a 10-day quarantine for residents, regardless of vaccination and booster status. During periods of critical staffing shortages, facilities may consider shortening the quarantine period for staff to ensure continuity of operations. Decisions to shorten quarantine in these settings should be made in consultation with state, local, tribal, or territorial health departments and should take into consideration the context and characteristics of the facility. CDC's setting-specific guidance provides additional recommendations for these settings. Isolation Isolation is used to separate people with confirmed or suspected COVID-19 from those without COVID-19. People who are in isolation should stay home until it's safe for them to be around others. At home, anyone sick or infected should separate from others, or wear a well-fitting mask when they need to be around others. People in isolation  should stay in a specific "sick room" or area and use a separate bathroom if available. Everyone who has presumed or confirmed COVID-19 should stay home and isolate from other people for at least 5 full days (day 0 is the first day of symptoms or the date of the day of the positive viral test for asymptomatic persons). They should wear a mask when around others at home and in public for an additional 5 days. People who are confirmed to have COVID-19 or are showing symptoms of COVID-19 need to isolate regardless of their vaccination status. This includes: People who have a positive viral test for COVID-19, regardless of whether or not they have symptoms. People with symptoms of COVID-19, including people who are awaiting test results or have not been tested. People with symptoms should isolate even if they do not know if they have been in close contact with someone with COVID-19. What to do for isolation Monitor your symptoms. If you have an emergency warning sign (including trouble breathing), seek emergency medical care immediately. Stay in a separate room from other household members, if possible. Use a separate bathroom, if possible. Take steps to improve ventilation at home, if possible. Avoid contact with other members of the household and pets. Don't share personal household items, like cups, towels, and utensils. Wear a well-fitting mask when you need to be around other people. Learn more about what to do if you are sick and how to notify your contacts. Ending isolation for people who had COVID-19 and had symptoms If you had COVID-19 and had symptoms, isolate for at least 5 days. To calculate your 5-day isolation period, day 0 is your first day of symptoms. Day 1 is the first full day after your symptoms developed. You can leave isolation after 5 full days. You can end isolation after 5 full days if you are fever-free for 24 hours without the use of fever-reducing medication and your other  symptoms have improved (Loss of taste and smell may persist for weeks or months after recovery and need not delay the end of isolation). You should continue to wear a well-fitting mask around others at home and in public for 5 additional days (day 6 through day 10) after the end of your 5-day isolation period. If you are unable to wear a mask when around others, you  should continue to isolate for a full 10 days. Avoid people who have weakened immune systems or are more likely to get very sick from COVID-19, and nursing homes and other high-risk settings, until after at least 10 days. If you continue to have fever or your other symptoms have not improved after 5 days of isolation, you should wait to end your isolation until you are fever-free for 24 hours without the use of fever-reducing medication and your other symptoms have improved. Continue to wear a well-fitting mask through day 10. Contact your healthcare provider if you have questions. See additional information about travel. Do not go to places where you are unable to wear a mask, such as restaurants and some gyms, and avoid eating around others at home and at work until a full 10 days after your first day of symptoms. If an individual has access to a test and wants to test, the best approach is to use an antigen test1 towards the end of the 5-day isolation period. Collect the test sample only if you are fever-free for 24 hours without the use of fever-reducing medication and your other symptoms have improved (loss of taste and smell may persist for weeks or months after recovery and need not delay the end of isolation). If your test result is positive, you should continue to isolate until day 10. If your test result is negative, you can end isolation, but continue to wear a well-fitting mask around others at home and in public until day 10. Follow additional recommendations for masking and avoiding travel as described above. 1As noted in the labeling  for authorized over-the counter antigen tests: Negative results should be treated as presumptive. Negative results do not rule out SARS-CoV-2 infection and should not be used as the sole basis for treatment or patient management decisions, including infection control decisions. To improve results, antigen tests should be used twice over a three-day period with at least 24 hours and no more than 48 hours between tests. Note that these recommendations on ending isolation do not apply to people who are moderately ill or very sick from COVID-19 or have weakened immune systems. See section below for recommendations for when to end isolation for these groups. Ending isolation for people who tested positive for COVID-19 but had no symptoms If you test positive for COVID-19 and never develop symptoms, isolate for at least 5 days. Day 0 is the day of your positive viral test (based on the date you were tested) and day 1 is the first full day after the specimen was collected for your positive test. You can leave isolation after 5 full days. If you continue to have no symptoms, you can end isolation after at least 5 days. You should continue to wear a well-fitting mask around others at home and in public until day 10 (day 6 through day 10). If you are unable to wear a mask when around others, you should continue to isolate for 10 days. Avoid people who have weakened immune systems or are more likely to get very sick from COVID-19, and nursing homes and other high-risk settings, until after at least 10 days. If you develop symptoms after testing positive, your 5-day isolation period should start over. Day 0 is your first day of symptoms. Follow the recommendations above for ending isolation for people who had COVID-19 and had symptoms. See additional information about travel. Do not go to places where you are unable to wear a mask, such as restaurants and  some gyms, and avoid eating around others at home and at work  until 10 days after the day of your positive test. If an individual has access to a test and wants to test, the best approach is to use an antigen test1 towards the end of the 5-day isolation period. If your test result is positive, you should continue to isolate until day 10. If your test result is positive, you can also choose to test daily and if your test result is negative, you can end isolation, but continue to wear a well-fitting mask around others at home and in public until day 10. Follow additional recommendations for masking and avoiding travel as described above. 1As noted in the labeling for authorized over-the counter antigen tests: Negative results should be treated as presumptive. Negative results do not rule out SARS-CoV-2 infection and should not be used as the sole basis for treatment or patient management decisions, including infection control decisions. To improve results, antigen tests should be used twice over a three-day period with at least 24 hours and no more than 48 hours between tests. Ending isolation for people who were moderately or very sick from COVID-19 or have a weakened immune system People who are moderately ill from COVID-19 (experiencing symptoms that affect the lungs like shortness of breath or difficulty breathing) should isolate for 10 days and follow all other isolation precautions. To calculate your 10-day isolation period, day 0 is your first day of symptoms. Day 1 is the first full day after your symptoms developed. If you are unsure if your symptoms are moderate, talk to a healthcare provider for further guidance. People who are very sick from COVID-19 (this means people who were hospitalized or required intensive care or ventilation support) and people who have weakened immune systems might need to isolate at home longer. They may also require testing with a viral test to determine when they can be around others. CDC recommends an isolation period of at least 10  and up to 20 days for people who were very sick from COVID-19 and for people with weakened immune systems. Consult with your healthcare provider about when you can resume being around other people. If you are unsure if your symptoms are severe or if you have a weakened immune system, talk to a healthcare provider for further guidance. People who have a weakened immune system should talk to their healthcare provider about the potential for reduced immune responses to COVID-19 vaccines and the need to continue to follow current prevention measures (including wearing a well-fitting mask and avoiding crowds and poorly ventilated indoor spaces) to protect themselves against COVID-19 until advised otherwise by their healthcare provider. Close contacts of immunocompromised people--including household members--should also be encouraged to receive all recommended COVID-19 vaccine doses to help protect these people. Isolation in high-risk congregate settings In certain high-risk congregate settings that have high risk of secondary transmission and where it is not feasible to cohort people (such as Systems analyst and detention facilities, homeless shelters, and cruise ships), CDC recommends a 10-day isolation period for residents. During periods of critical staffing shortages, facilities may consider shortening the isolation period for staff to ensure continuity of operations. Decisions to shorten isolation in these settings should be made in consultation with state, local, tribal, or territorial health departments and should take into consideration the context and characteristics of the facility. CDC's setting-specific guidance provides additional recommendations for these settings. This CDC guidance is meant to supplement--not replace--any federal, state, local, territorial, or tribal  health and safety laws, rules, and regulations. Recommendations for specific settings These recommendations do not apply to healthcare  professionals. For guidance specific to these settings, see Healthcare professionals: Interim Guidance for Optician, dispensing with SARS-CoV-2 Infection or Exposure to SARS-CoV-2 Patients, residents, and visitors to healthcare settings: Interim Infection Prevention and Control Recommendations for Healthcare Personnel During the [place] 2019 (COVID-19) Pandemic Additional setting-specific guidance and recommendations are available. These recommendations on quarantine and isolation do apply to [place] settings. Additional guidance is available here: Overview of COVID-19 Quarantine for K-12 Schools Travelers: Travel information and recommendations Congregate facilities and other settings: [employer] for community, work, and school settings Ongoing COVID-19 exposure FAQs I live with someone with COVID-19, but I cannot be separated from them. How do we manage quarantine in this situation? It is very important for people with COVID-19 to remain apart from other people, if possible, even if they are living together. If separation of the person with COVID-19 from others that they live with is not possible, the other people that they live with will have ongoing exposure, meaning they will be repeatedly exposed until that person is no longer able to spread the virus to other people. In this situation, there are precautions you can take to limit the spread of COVID-19: The person with COVID-19 and everyone they live with should wear a well-fitting mask inside the home. If possible, one person should care for the person with COVID-19 to limit the number of people who are in close contact with the infected person. Take steps to protect yourself and others to reduce transmission in the home: Quarantine if you are not up to date with your COVID-19 vaccines. Isolate if you are sick or tested positive for COVID-19, even if you don't have symptoms. Learn more about the public health  recommendations for testing, mask use and quarantine of close contacts, like yourself, who have ongoing exposure. These recommendations differ depending on your vaccination status. What should I do if I have ongoing exposure to COVID-19 from someone I live with? Recommendations for this situation depend on your vaccination status: If you are not up to date on COVID-19 vaccines and have ongoing exposure to COVID-19, you should: Begin quarantine immediately and continue to quarantine throughout the isolation period of the person with COVID-19. Continue to quarantine for an additional 5 days starting the day after the end of isolation for the person with COVID-19. Get tested at least 5 days after the end of isolation of the infected person that lives with them. If you test negative, you can leave the home but should continue to wear a well-fitting mask when around others at home and in public until 10 days after the end of isolation for the person with COVID-19. Isolate immediately if you develop symptoms of COVID-19 or test positive. If you are up to date with COVID-19 vaccines and have ongoing exposure to COVID-19, you should: Get tested at least 5 days after your first exposure. A person with COVID-19 is considered infectious starting 2 days before they develop symptoms, or 2 days before the date of their positive test if they do not have symptoms. Get tested again at least 5 days after the end of isolation for the person with COVID-19. Wear a well-fitting mask when you are around the person with COVID-19, and do this throughout their isolation period. Wear a well-fitting mask around others for 10 days after the infected person's isolation period ends. Isolate immediately if you develop  symptoms of COVID-19 or test positive. What should I do if multiple people I live with test positive for COVID-19 at different times? Recommendations for this situation depend on your vaccination status: If you are  not up to date with your COVID-19 vaccines, you should: Quarantine throughout the isolation period of any infected person that you live with. Continue to quarantine until 5 days after the end of isolation date for the most recently infected person that lives with you. For example, if the last day of isolation of the person most recently infected with COVID-19 was June 30, the new 5-day quarantine period starts on July 1. Get tested at least 5 days after the end of isolation for the most recently infected person that lives with you. Wear a well-fitting mask when you are around any person with COVID-19 while that person is in isolation. Wear a well-fitting mask when you are around other people until 10 days after your last close contact. Isolate immediately if you develop symptoms of COVID-19 or test positive. If you are up to date with your COVID-19 vaccines, you should: Get tested at least 5 days after your first exposure. A person with COVID-19 is considered infectious starting 2 days before they developed symptoms, or 2 days before the date of their positive test if they do not have symptoms. Get tested again at least 5 days after the end of isolation for the most recently infected person that lives with you. Wear a well-fitting mask when you are around any person with COVID-19 while that person is in isolation. Wear a well-fitting mask around others for 10 days after the end of isolation for the most recently infected person that lives with you. For example, if the last day of isolation for the person most recently infected with COVID-19 was June 30, the new 10-day period to wear a well-fitting mask indoors in public starts on July 1. Isolate immediately if you develop symptoms of COVID-19 or test positive. I had COVID-19 and completed isolation. Do I have to quarantine or get tested if someone I live with gets COVID-19 shortly after I completed isolation? No. If you recently completed isolation and  someone that lives with you tests positive for the virus that causes COVID-19 shortly after the end of your isolation period, you do not have to quarantine or get tested as long as you do not develop new symptoms. Once all of the people that live together have completed isolation or quarantine, refer to the guidance below for new exposures to COVID-19. If you had COVID-19 in the previous 90 days and then came into close contact with someone with COVID-19, you do not have to quarantine or get tested if you do not have symptoms. But you should: Wear a well-fitting mask indoors in public for 10 days after your last close contact. Monitor for COVID-19 symptoms for 10 days from the date of your last close contact. Isolate immediately and get tested if symptoms develop. If more than 90 days have passed since your recovery from infection, follow CDC's recommendations for close contacts. These recommendations will differ depending on your vaccination status. 07/28/2020 Content source: [facility] ([facility]), Division of Viral Diseases This information is not intended to replace advice given to you by your health care provider. Make sure you discuss any questions you have with your health care provider. Document Revised: 12/01/2020 Document Reviewed: 12/01/2020 Elsevier Patient Education  [place].

## 2021-11-14 NOTE — Progress Notes (Signed)
Subjective:     Patient ID: Theresa Harrington, female    DOB: [birth year], 66 y.o.   MRN: [id number]  Chief Complaint  Patient presents with   Cough    Weakness and body aches    HPI Patient is in today for a 2 day history of sore throat, chills, rhinorrhea, and dry cough.  Requests Tussionex refill.   No fever, dizziness, chest pain, palpitations, shortness of breath, abdominal pain, N/V/D.      Health Maintenance Due  Topic Date Due   PAP SMEAR-Modifier  03/04/2018   COVID-19 Vaccine (3 - Moderna series) 09/23/2019   COLONOSCOPY (Pts 45-7yr Insurance coverage will need to be confirmed)  07/10/2021    Past Medical History:  Diagnosis Date   Allergy    Anxiety    Pt. denies   Arthritis    right index figer   Asymptomatic cholelithiasis    Atherosclerosis of aorta (HCC)    Cataract    Clotting disorder ([place]    Gallstones 06/2013   GERD (gastroesophageal reflux disease)    History of kidney stones    lithotrispy   Hyperlipidemia    Hypertension    Hypothyroidism    no meds   Iron deficiency anemia    PAD (peripheral artery disease) (HCC)    PAF (paroxysmal atrial fibrillation) (HCC)    Peripheral arterial occlusive disease (HCC)    lower extremities   Pneumonia    PONV (postoperative nausea and vomiting)    Right ureteral stone    Type 2 diabetes mellitus ([facility]    Type   Vitamin B 12 deficiency    Wears glasses     Past Surgical History:  Procedure Laterality Date   ABDOMINAL AORTOGRAM W/LOWER EXTREMITY N/A 03/09/2020   Procedure: ABDOMINAL AORTOGRAM W/LOWER EXTREMITY;  Surgeon: BSerafina Mitchell MD;  [facility];  Service: Cardiovascular;  Laterality: N/A;   AMPUTATION Left 07/16/2014   Procedure: LEFT SECOND TOE AMPUTATION;  Surgeon: VSerafina Mitchell MD;  [place] [facility]  Service: Vascular;  Laterality: Left;  With Nerve block   ATRIAL FIBRILLATION ABLATION N/A 08/02/2021   Procedure: ATRIAL FIBRILLATION ABLATION;  Surgeon: CConstance Haw MD;  [facility];  Service: Cardiovascular;  Laterality: N/A;   AUGMENTATION MAMMAPLASTY     BELPHAROPTOSIS REPAIR     eyelid lift   BIOPSY  10/14/2018   Procedure: BIOPSY;  Surgeon: MRush LandmarkGTelford Nab, MD;  [place] [facility]  Service: Gastroenterology;;   BIOPSY  08/04/2019   Procedure: BIOPSY;  Surgeon: MIrving Copas, MD;  [place] [facility]  Service: Gastroenterology;;   CARDIOVERSION N/A 09/19/2019   Procedure: CARDIOVERSION;  Surgeon: CBuford Dresser MD;  [place] [facility]  Service: Cardiovascular;  Laterality: N/A;   COLONOSCOPY     COMBINED AUGMENTATION MAMMAPLASTY AND ABDOMINOPLASTY  2009   W/  BILATERAL  THIGH LIFT   CYSTO/  RIGHT URETERAL STENT PLACEMENT  12-30-2010   CYSTOSCOPY WITH RETROGRADE PYELOGRAM, URETEROSCOPY AND STENT PLACEMENT Right 10/07/2013   Procedure: CYSTOSCOPY WITH RETROGRADE PYELOGRAM, right URETEROSCOPY AND STENT PLACEMENT, stone extraction;  Surgeon: MArvil Persons MD;  [facility]  Service: Urology;  Laterality: Right;   CYSTOSCOPY WITH STENT PLACEMENT Right 06/04/2013   Procedure: CYSTOSCOPY WITH STENT PLACEMENT;  Surgeon: SFranchot Gallo MD;  [facility];  Service: Urology;  Laterality: Right;   DILATATION & CURETTAGE/HYSTEROSCOPY WITH MYOSURE N/A 04/27/2015   Procedure: DILATATION & CURETTAGE/HYSTEROSCOPY WITH MYOSURE;  Surgeon: JElita Quick  Linna Caprice, MD;  Location: [facility];  Service: Gynecology;  Laterality: N/A;   ENDARTERECTOMY FEMORAL Left 06/08/2014   Procedure: Left Leg Common Femoral and External Iliac  Endartarectomy with patch Angioplasty;  Surgeon: Rosetta Posner, MD;  Location: [facility];  Service: Vascular;  Laterality: Left;   ESOPHAGOGASTRODUODENOSCOPY N/A 10/14/2018   Procedure: ESOPHAGOGASTRODUODENOSCOPY (EGD);  Surgeon: Irving Copas., MD;  Location: [place];  Service: Gastroenterology;  Laterality: N/A;   ESOPHAGOGASTRODUODENOSCOPY (EGD) WITH PROPOFOL  N/A 11/06/2018   Procedure: ESOPHAGOGASTRODUODENOSCOPY (EGD) WITH PROPOFOL;  Surgeon: Rush Landmark Telford Nab., MD;  [facility];  Service: Gastroenterology;  Laterality: N/A;  RFA   ESOPHAGOGASTRODUODENOSCOPY (EGD) WITH PROPOFOL N/A 02/12/2019   Procedure: ESOPHAGOGASTRODUODENOSCOPY (EGD) WITH PROPOFOL;  Surgeon: Rush Landmark Telford Nab., MD;  [facility];  Service: Gastroenterology;  Laterality: N/A;   ESOPHAGOGASTRODUODENOSCOPY (EGD) WITH PROPOFOL N/A 08/04/2019   Procedure: ESOPHAGOGASTRODUODENOSCOPY (EGD) WITH PROPOFOL;  Surgeon: Rush Landmark Telford Nab., MD;  Location: [place];  Service: Gastroenterology;  Laterality: N/A;  WITH RFA   EXTRACORPOREAL SHOCK WAVE LITHOTRIPSY Right 08-04-2013//   06-23-2013//   01-16-2011   EYE SURGERY Bilateral    cataract   FEMORAL-POPLITEAL BYPASS GRAFT Left 06/08/2014   Procedure: Left Leg Femoral -Popliteal Bypass Graft;  Surgeon: Rosetta Posner, MD;  Location: [facility] OR;  Service: Vascular;  Laterality: Left;   FEMORAL-POPLITEAL BYPASS GRAFT Left 06/09/2014   Procedure: Left Femoral and Popliteal Exposure; Left Femoral to Anterior Tibial Bypass Graft using Propaten 47m by 80cm Goretex Graft; Left Tibial Endarterectomy; Left Femoraland Popliteal Thrombectomy ;  Surgeon: VSerafina Mitchell MD;  [place] [facility]  Service: Vascular;  Laterality: Left;   GI RADIOFREQUENCY ABLATION N/A 11/06/2018   Procedure: GI RADIOFREQUENCY ABLATION;  Surgeon: MIrving Copas, MD;  [facility];  Service: Gastroenterology;  Laterality: N/A;   GI RADIOFREQUENCY ABLATION N/A 02/12/2019   Procedure: GI RADIOFREQUENCY ABLATION;  Surgeon: MRush LandmarkGTelford Nab, MD;  [facility];  Service: Gastroenterology;  Laterality: N/A;   GI RADIOFREQUENCY ABLATION N/A 08/04/2019   Procedure: GI RADIOFREQUENCY ABLATION;  Surgeon: MRush LandmarkGTelford Nab, MD;  [place]  Service: Gastroenterology;  Laterality: N/A;   HOLMIUM LASER APPLICATION  Right 67/11/2421  Procedure: HOLMIUM LASER APPLICATION;  Surgeon: MArvil Persons MD;  [facility]  Service: Urology;  Laterality: Right;   KIDNEY STONE SURGERY  April 2015   1-2 stones   LAPAROSCOPIC GASTRIC BANDING  05-29-2005   LITHOTRIPSY  2-3 times   LOWER EXTREMITY ANGIOGRAM N/A 06/04/2014   Procedure: LOWER EXTREMITY ANGIOGRAM;  Surgeon: VSerafina Mitchell MD;  [facility];  Service: Cardiovascular;  Laterality: N/A;   ORIF FIFTH METACARPAL FX  RIGHT HAND  04-21-2002   REVISION AND RE-SITING LAP-BAND PORT  04-08-2010   W/  UPPER EGD   RIGHT KNEE PATELLECTOMY W/ REPAIR OF EXTENSOR MECHANISM  04-14-2002   TEE WITHOUT CARDIOVERSION N/A 12/24/2019   Procedure: TRANSESOPHAGEAL ECHOCARDIOGRAM (TEE);  Surgeon: TSueanne Margarita MD;  [facility];  Service: Cardiovascular;  Laterality: N/A;   Toenail removed Left Jan. 21, 2016   2nd toenail-  Dr. PBarkley Bruns   Family History  Problem Relation Age of Onset   Diabetes Father    Heart disease Father    Deep vein thrombosis Father    Hyperlipidemia Father    Diabetes Sister    Heart disease Sister    Deep vein thrombosis Sister    Hyperlipidemia Sister    Diabetes Brother  Heart disease Brother    Cancer Brother    Hyperlipidemia Brother    Colon cancer Maternal Aunt    Diabetes Brother    Diabetes Brother    Kidney disease Mother    Hyperlipidemia Mother    Other Mother        AAA   and    Amputation   Arthritis Other    Cancer Other        colon   Hypertension Other    Stroke Other    Esophageal cancer Neg Hx    Stomach cancer Neg Hx    Inflammatory bowel disease Neg Hx    Liver disease Neg Hx    Pancreatic cancer Neg Hx     Social History   Socioeconomic History   Marital status: Single    Spouse name: Not on file   Number of children: Not on file   Years of education: Not on file   Highest education level: Not on file  Occupational History   Occupation: Quarry manager:  [employer]  Tobacco Use   Smoking status: Former    Packs/day: 1.00    Years: 28.00    Total pack years: 28.00    Types: Cigarettes    Quit date: 09/05/2001    Years since quitting: 20.2   Smokeless tobacco: Never  Vaping Use   Vaping Use: Never used  Substance and Sexual Activity   Alcohol use: No    Alcohol/week: 0.0 standard drinks of alcohol   Drug use: No   Sexual activity: Yes  Other Topics Concern   Not on file  Social History Narrative   Regular exercise- yes   Social Determinants of Health   Financial Resource Strain: Low Risk  (06/03/2020)   Overall Financial Resource Strain (CARDIA)    Difficulty of Paying Living Expenses: Not hard at all  Food Insecurity: No Food Insecurity (06/03/2020)   Hunger Vital Sign    Worried About Running Out of Food in the Last Year: Never true    Ran Out of Food in the Last Year: Never true  Transportation Needs: No Transportation Needs (06/03/2020)   PRAPARE - Hydrologist (Medical): No    Lack of Transportation (Non-Medical): No  Physical Activity: Inactive (06/03/2020)   Exercise Vital Sign    Days of Exercise per Week: 0 days    Minutes of Exercise per Session: 0 min  Stress: No Stress Concern Present (06/03/2020)   [place]    Feeling of Stress : Not at all  Social Connections: Moderately Integrated (06/03/2020)   Social Connection and Isolation Panel [NHANES]    Frequency of Communication with Friends and Family: More than three times a week    Frequency of Social Gatherings with Friends and Family: More than three times a week    Attends Religious Services: More than 4 times per year    Active Member of [employer] or Organizations: Yes    Attends Music therapist: More than 4 times per year    Marital Status: Never married  Human resources officer Violence: Not on file    Outpatient Medications Prior to Visit  Medication Sig  Dispense Refill   Blood Glucose Monitoring Suppl (ONETOUCH VERIO IQ SYSTEM) w/Device KIT USE TO CHECK SUGAR DAILY 1 kit 0   cetirizine (ZYRTEC) 10 MG tablet Take 10 mg by mouth daily as needed for allergies.  dorzolamide-timolol (COSOPT) 22.3-6.8 MG/ML ophthalmic solution Place 1 drop into the right eye 2 (two) times daily.      Empagliflozin-metFORMIN HCl ER (SYNJARDY XR) 12.08-998 MG TB24 Take 1 tablet by mouth daily. Via [employer] patient assistance 90 tablet 1   ezetimibe (ZETIA) 10 MG tablet TAKE ONE TABLET BY MOUTH EVERYDAY AT BEDTIME 90 tablet 1   Glucagon (GVOKE HYPOPEN 2-PACK) 1 MG/0.2ML SOAJ Inject 1 Act into the skin daily as needed. 2 mL 5   Insulin Glargine (BASAGLAR KWIKPEN) 100 UNIT/ML Inject 50 Units into the skin at bedtime. [place] patient assistance (Patient taking differently: Inject 40-42 Units into the skin at bedtime. [place] patient assistance) 30 mL 2   insulin lispro (HUMALOG) 100 UNIT/ML injection Inject 10-14 Units into the skin 3 (three) times daily before meals. [place] patient assistance Sliding scale     Insulin Pen Needle (PEN NEEDLES) 32G X 4 MM MISC Inject 1 pen as directed 4 (four) times daily. Use to inject levemir or novolog as directed. 400 each 3   latanoprost (XALATAN) 0.005 % ophthalmic solution Place 1 drop into both eyes at bedtime.      losartan (COZAAR) 100 MG tablet TAKE ONE TABLET BY MOUTH EVERYDAY AT BEDTIME 90 tablet 1   magnesium oxide (MAG-OX) 400 (240 Mg) MG tablet TAKE ONE TABLET BY MOUTH EVERYDAY AT BEDTIME 90 tablet 1   nebivolol (BYSTOLIC) 10 MG tablet Take 1 tablet (10 mg total) by mouth daily. 90 tablet 3   omeprazole (PRILOSEC) 40 MG capsule TAKE ONE CAPSULE BY MOUTH EVERY MORNING and TAKE ONE CAPSULE BY MOUTH EVERY EVENING 180 capsule 1   ONETOUCH VERIO test strip Use to check blood sugar twice daily. 200 strip 3   pravastatin (PRAVACHOL) 40 MG tablet TAKE ONE TABLET BY MOUTH EVERYDAY AT BEDTIME 90 tablet 1    Semaglutide, 1 MG/DOSE, (OZEMPIC, 1 MG/DOSE,) 4 MG/3ML SOPN Inject 1 mg into the skin once a week. Via [employer] patient assistance 9 mL 1   XARELTO 20 MG TABS tablet Take 1 tablet by mouth once daily 90 tablet 0   No facility-administered medications prior to visit.    Allergies  Allergen Reactions   Amlodipine Swelling   Atorvastatin Other (See Comments)    Muscle aches    Ampicillin Nausea And Vomiting and Other (See Comments)   Codeine Nausea And Vomiting    Ask patient   Lisinopril Cough        Penicillins Nausea And Vomiting    Did it involve swelling of the face/tongue/throat, SOB, or low BP? No Did it involve sudden or severe rash/hives, skin peeling, or any reaction on the inside of your mouth or nose? No Did you need to seek medical attention at a hospital or doctor's office? No When did it last happen?      20+ years If all above answers are "NO", may proceed with cephalosporin use.    Januvia [Sitagliptin] Nausea And Vomiting    ROS     Objective:    Physical Exam  BP 102/74 (BP Location: Left Arm, Patient Position: Sitting, Cuff Size: Large)   Pulse 65   Temp 98 F (36.7 C) (Oral)   Ht 5' 4.5" (1.638 m)   Wt 164 lb (74.4 kg)   LMP  (LMP Unknown)   SpO2 97%   BMI 27.72 kg/m  Wt Readings from Last 3 Encounters:  11/14/21 164 lb (74.4 kg)  10/07/21 167 lb (75.8  kg)  09/20/21 164 lb (74.4 kg)   Alert and oriented and in no acute distress. Mask on. Respirations unlabored. Normal speech and mood.     Assessment & Plan:   Problem List Items Addressed This Visit       Other   Acute cough   Relevant Medications   chlorpheniramine-HYDROcodone (TUSSIONEX PENNKINETIC ER) 10-8 MG/5ML   Other Relevant Orders   POC SOFIA Antigen FIA (Completed)   COVID-19 virus infection - Primary    Positive COVID test in the office.  Molnupiravir prescribed.  Discussed how to take medication and potential side effects.  Tussionex refilled per patient request for cough.   This has worked well for her in the past.  Discussed symptomatic management.  Counseling on CDC guidelines for quarantine. PDMP reviewed.      Relevant Medications   molnupiravir EUA (LAGEVRIO) 200 MG CAPS capsule   Other Visit Diagnoses     Acute pharyngitis, unspecified etiology       Body aches           I am having Natoya Soth start on molnupiravir EUA and chlorpheniramine-HYDROcodone. I am also having her maintain her Pen Needles, latanoprost, OneTouch Verio IQ System, dorzolamide-timolol, insulin lispro, cetirizine, Basaglar KwikPen, Ozempic (1 MG/DOSE), Gvoke HypoPen 2-Pack, OneTouch Verio, nebivolol, pravastatin, magnesium oxide, ezetimibe, omeprazole, Synjardy XR, losartan, and Xarelto.  Meds ordered this encounter  Medications   molnupiravir EUA (LAGEVRIO) 200 MG CAPS capsule    Sig: Take 4 capsules (800 mg total) by mouth 2 (two) times daily for 5 days.    Dispense:  40 capsule    Refill:  0    Order Specific Question:   Supervising Provider    Answer:   Pricilla Holm A [[id number]]   chlorpheniramine-HYDROcodone (TUSSIONEX PENNKINETIC ER) 10-8 MG/5ML    Sig: Take 5 mLs by mouth every 12 (twelve) hours as needed for cough.    Dispense:  100 mL    Refill:  0    Order Specific Question:   Supervising Provider    Answer:   Pricilla Holm A [[id number]]

## 2021-11-15 ENCOUNTER — Other Ambulatory Visit: Payer: Self-pay | Admitting: Family Medicine

## 2021-11-15 MED ORDER — HYDROCOD POLI-CHLORPHE POLI ER 10-8 MG/5ML PO SUER: 5.0000 mL | Freq: Two times a day (BID) | ORAL | 0 refills | Status: DC | PRN

## 2021-11-30 ENCOUNTER — Ambulatory Visit: Payer: PPO | Admitting: *Deleted

## 2021-11-30 VITALS — Ht 65.0 in | Wt 171.2 lb

## 2021-11-30 DIAGNOSIS — Z8719 Personal history of other diseases of the digestive system: Secondary | ICD-10-CM

## 2021-11-30 DIAGNOSIS — Z800 Family history of malignant neoplasm of digestive organs: Secondary | ICD-10-CM

## 2021-11-30 DIAGNOSIS — Z86010 Personal history of colonic polyps: Secondary | ICD-10-CM

## 2021-11-30 MED ORDER — NA SULFATE-K SULFATE-MG SULF 17.5-3.13-1.6 GM/177ML PO SOLN: 1.0000 | Freq: Once | ORAL | 0 refills | Status: AC

## 2021-11-30 NOTE — Progress Notes (Signed)
No egg or soy allergy known to patient  No issues known to pt with past sedation with any surgeries or procedures Patient denies ever being told they had issues or difficulty with intubation  No FH of Malignant Hyperthermia Pt is not on diet pills Pt is not on  home 02  Pt is  on blood thinners  Pt denies issues with constipation  Yes A fib  No a flutter Have any cardiac testing pending--no Pt instructed to use [email or web address].com or GoodRx for a price reduction on prep

## 2021-12-05 DIAGNOSIS — M545 Low back pain, unspecified: Secondary | ICD-10-CM | POA: Diagnosis not present

## 2021-12-05 DIAGNOSIS — M542 Cervicalgia: Secondary | ICD-10-CM | POA: Diagnosis not present

## 2021-12-07 ENCOUNTER — Ambulatory Visit (INDEPENDENT_AMBULATORY_CARE_PROVIDER_SITE_OTHER): Payer: PPO

## 2021-12-07 ENCOUNTER — Encounter: Payer: PPO | Admitting: Gastroenterology

## 2021-12-07 ENCOUNTER — Telehealth: Payer: Self-pay

## 2021-12-07 VITALS — BP 118/60 | HR 62 | Temp 97.4°F | Resp 16 | Ht 64.5 in | Wt 170.6 lb

## 2021-12-07 DIAGNOSIS — Z0000 Encounter for general adult medical examination without abnormal findings: Secondary | ICD-10-CM

## 2021-12-07 NOTE — Patient Instructions (Addendum)
Theresa Harrington , Thank you for taking time to come for your Medicare Wellness Visit. I appreciate your ongoing commitment to your health goals. Please review the following plan we discussed and let me know if I can assist you in the future.   Screening recommendations/referrals: Colonoscopy: 07/11/2018; (scheduled for 12/2021) Mammogram: 06/30/2021; due every year Bone Density: never done Recommended yearly ophthalmology/optometry visit for glaucoma screening and checkup Recommended yearly dental visit for hygiene and checkup  Vaccinations: Influenza vaccine: 12/30/2020 Pneumococcal vaccine: 06/25/2014, 05/10/2017 Tdap vaccine: 10/15/2017; due every 10 years Shingles vaccine: 01/19/2019, 03/29/2019  Covid-19: 07/02/2019, 07/30/2019, 02/23/2020, 11/12/2020  Advanced directives: Yes  Conditions/risks identified: Yes  Next appointment: Please schedule your next Medicare Wellness Visit with your Nurse Health Advisor in 1 year by calling [phone].  Preventive Care 40-64 Years, Female Preventive care refers to lifestyle choices and visits with your health care provider that can promote health and wellness. What does preventive care include? A yearly physical exam. This is also called an annual well check. Dental exams once or twice a year. Routine eye exams. Ask your health care provider how often you should have your eyes checked. Personal lifestyle choices, including: Daily care of your teeth and gums. Regular physical activity. Eating a healthy diet. Avoiding tobacco and drug use. Limiting alcohol use. Practicing safe sex. Taking low-dose aspirin daily starting at age 3. Taking vitamin and mineral supplements as recommended by your health care provider. What happens during an annual well check? The services and screenings done by your health care provider during your annual well check will depend on your age, overall health, lifestyle risk factors, and family history of disease. Counseling   Your health care provider may ask you questions about your: Alcohol use. Tobacco use. Drug use. Emotional well-being. Home and relationship well-being. Sexual activity. Eating habits. Work and work Statistician. Method of birth control. Menstrual cycle. Pregnancy history. Screening  You may have the following tests or measurements: Height, weight, and BMI. Blood pressure. Lipid and cholesterol levels. These may be checked every 5 years, or more frequently if you are over 32 years old. Skin check. Lung cancer screening. You may have this screening every year starting at age 58 if you have a 30-pack-year history of smoking and currently smoke or have quit within the past 15 years. Fecal occult blood test (FOBT) of the stool. You may have this test every year starting at age 66. Flexible sigmoidoscopy or colonoscopy. You may have a sigmoidoscopy every 5 years or a colonoscopy every 10 years starting at age 43. Hepatitis C blood test. Hepatitis B blood test. Sexually transmitted disease (STD) testing. Diabetes screening. This is done by checking your blood sugar (glucose) after you have not eaten for a while (fasting). You may have this done every 1-3 years. Mammogram. This may be done every 1-2 years. Talk to your health care provider about when you should start having regular mammograms. This may depend on whether you have a family history of breast cancer. BRCA-related cancer screening. This may be done if you have a family history of breast, ovarian, tubal, or peritoneal cancers. Pelvic exam and Pap test. This may be done every 3 years starting at age 59. Starting at age 26, this may be done every 5 years if you have a Pap test in combination with an HPV test. Bone density scan. This is done to screen for osteoporosis. You may have this scan if you are at high risk for osteoporosis. Discuss your test results,  treatment options, and if necessary, the need for more tests with your health  care provider. Vaccines  Your health care provider may recommend certain vaccines, such as: Influenza vaccine. This is recommended every year. Tetanus, diphtheria, and acellular pertussis (Tdap, Td) vaccine. You may need a Td booster every 10 years. Zoster vaccine. You may need this after age 71. Pneumococcal 13-valent conjugate (PCV13) vaccine. You may need this if you have certain conditions and were not previously vaccinated. Pneumococcal polysaccharide (PPSV23) vaccine. You may need one or two doses if you smoke cigarettes or if you have certain conditions. Talk to your health care provider about which screenings and vaccines you need and how often you need them. This information is not intended to replace advice given to you by your health care provider. Make sure you discuss any questions you have with your health care provider. Document Released: 05/14/2015 Document Revised: 01/05/2016 Document Reviewed: 02/16/2015 Elsevier Interactive Patient Education  2017 [place] Prevention in the Home Falls can cause injuries. They can happen to people of all ages. There are many things you can do to make your home safe and to help prevent falls. What can I do on the outside of my home? Regularly fix the edges of walkways and driveways and fix any cracks. Remove anything that might make you trip as you walk through a door, such as a raised step or threshold. Trim any bushes or trees on the path to your home. Use bright outdoor lighting. Clear any walking paths of anything that might make someone trip, such as rocks or tools. Regularly check to see if handrails are loose or broken. Make sure that both sides of any steps have handrails. Any raised decks and porches should have guardrails on the edges. Have any leaves, snow, or ice cleared regularly. Use sand or salt on walking paths during winter. Clean up any spills in your garage right away. This includes oil or grease  spills. What can I do in the bathroom? Use night lights. Install grab bars by the toilet and in the tub and shower. Do not use towel bars as grab bars. Use non-skid mats or decals in the tub or shower. If you need to sit down in the shower, use a plastic, non-slip stool. Keep the floor dry. Clean up any water that spills on the floor as soon as it happens. Remove soap buildup in the tub or shower regularly. Attach bath mats securely with double-sided non-slip rug tape. Do not have throw rugs and other things on the floor that can make you trip. What can I do in the bedroom? Use night lights. Make sure that you have a light by your bed that is easy to reach. Do not use any sheets or blankets that are too big for your bed. They should not hang down onto the floor. Have a firm chair that has side arms. You can use this for support while you get dressed. Do not have throw rugs and other things on the floor that can make you trip. What can I do in the kitchen? Clean up any spills right away. Avoid walking on wet floors. Keep items that you use a lot in easy-to-reach places. If you need to reach something above you, use a strong step stool that has a grab bar. Keep electrical cords out of the way. Do not use floor polish or wax that makes floors slippery. If you must use wax, use non-skid floor wax.  Do not have throw rugs and other things on the floor that can make you trip. What can I do with my stairs? Do not leave any items on the stairs. Make sure that there are handrails on both sides of the stairs and use them. Fix handrails that are broken or loose. Make sure that handrails are as long as the stairways. Check any carpeting to make sure that it is firmly attached to the stairs. Fix any carpet that is loose or worn. Avoid having throw rugs at the top or bottom of the stairs. If you do have throw rugs, attach them to the floor with carpet tape. Make sure that you have a light switch at the  top of the stairs and the bottom of the stairs. If you do not have them, ask someone to add them for you. What else can I do to help prevent falls? Wear shoes that: Do not have high heels. Have rubber bottoms. Are comfortable and fit you well. Are closed at the toe. Do not wear sandals. If you use a stepladder: Make sure that it is fully opened. Do not climb a closed stepladder. Make sure that both sides of the stepladder are locked into place. Ask someone to hold it for you, if possible. Clearly mark and make sure that you can see: Any grab bars or handrails. First and last steps. Where the edge of each step is. Use tools that help you move around (mobility aids) if they are needed. These include: Canes. Walkers. Scooters. Crutches. Turn on the lights when you go into a dark area. Replace any light bulbs as soon as they burn out. Set up your furniture so you have a clear path. Avoid moving your furniture around. If any of your floors are uneven, fix them. If there are any pets around you, be aware of where they are. Review your medicines with your doctor. Some medicines can make you feel dizzy. This can increase your chance of falling. Ask your doctor what other things that you can do to help prevent falls. This information is not intended to replace advice given to you by your health care provider. Make sure you discuss any questions you have with your health care provider. Document Released: 02/11/2009 Document Revised: 09/23/2015 Document Reviewed: 05/22/2014 Elsevier Interactive Patient Education  2017 [employer].

## 2021-12-07 NOTE — Progress Notes (Signed)
Subjective:   Theresa Harrington is a 66 y.o. female who presents for Medicare Annual (Subsequent) preventive examination.  Review of Systems     Cardiac Risk Factors include: diabetes mellitus;dyslipidemia;family history of premature cardiovascular disease;hypertension     Objective:    Today's Vitals   12/07/21 1045  BP: 118/60  Pulse: 62  Resp: 16  Temp: (!) 97.4 F (36.3 C)  SpO2: 95%  Weight: 170 lb 9.6 oz (77.4 kg)  Height: 5' 4.5" (1.638 m)  PainSc: 0-No pain   Body mass index is 28.83 kg/m.     12/07/2021   11:12 AM 08/02/2021    9:08 AM 06/03/2020    8:25 AM 03/09/2020    7:48 AM 03/08/2020   11:21 AM 12/24/2019    8:58 AM 09/19/2019    9:24 AM  Advanced Directives  Does Patient Have a Medical Advance Directive? Yes No Yes No Yes Yes Yes  Type of Advance Directive Living will;Healthcare Power of [place];Living will [place];Living will [place];Living will  Does patient want to make changes to medical advance directive? No - Patient declined  No - Patient declined      Copy of [place] in Chart? No - copy requested    No - copy requested No - copy requested No - copy requested  Would patient like information on creating a medical advance directive?  No - Patient declined  No - Patient declined No - Patient declined      Current Medications (verified) Outpatient Encounter Medications as of 12/07/2021  Medication Sig   Blood Glucose Monitoring Suppl (ONETOUCH VERIO IQ SYSTEM) w/Device KIT USE TO CHECK SUGAR DAILY   cetirizine (ZYRTEC) 10 MG tablet Take 10 mg by mouth daily as needed for allergies.   chlorpheniramine-HYDROcodone (TUSSIONEX PENNKINETIC ER) 10-8 MG/5ML Take 5 mLs by mouth every 12 (twelve) hours as needed for cough. (Patient not taking: Reported on 11/30/2021)   dorzolamide-timolol (COSOPT) 22.3-6.8 MG/ML ophthalmic solution Place 1 drop into the right eye 2 (two) times  daily.    Empagliflozin-metFORMIN HCl ER (SYNJARDY XR) 12.08-998 MG TB24 Take 1 tablet by mouth daily. Via [employer] patient assistance   ezetimibe (ZETIA) 10 MG tablet TAKE ONE TABLET BY MOUTH EVERYDAY AT BEDTIME   Glucagon (GVOKE HYPOPEN 2-PACK) 1 MG/0.2ML SOAJ Inject 1 Act into the skin daily as needed. (Patient not taking: Reported on 11/30/2021)   Insulin Glargine (BASAGLAR KWIKPEN) 100 UNIT/ML Inject 50 Units into the skin at bedtime. [place] patient assistance (Patient taking differently: Inject 40-42 Units into the skin at bedtime. [place] patient assistance taking 46 units)   insulin lispro (HUMALOG) 100 UNIT/ML injection Inject 10-14 Units into the skin 3 (three) times daily before meals. [place] patient assistance Sliding scale   Insulin Pen Needle (PEN NEEDLES) 32G X 4 MM MISC Inject 1 pen as directed 4 (four) times daily. Use to inject levemir or novolog as directed.   latanoprost (XALATAN) 0.005 % ophthalmic solution Place 1 drop into both eyes at bedtime.    losartan (COZAAR) 100 MG tablet TAKE ONE TABLET BY MOUTH EVERYDAY AT BEDTIME (Patient taking differently: daily.)   magnesium oxide (MAG-OX) 400 (240 Mg) MG tablet TAKE ONE TABLET BY MOUTH EVERYDAY AT BEDTIME   nebivolol (BYSTOLIC) 10 MG tablet Take 1 tablet (10 mg total) by mouth daily.   omeprazole (PRILOSEC) 40 MG capsule TAKE ONE CAPSULE BY  MOUTH EVERY MORNING and TAKE ONE CAPSULE BY MOUTH EVERY EVENING   ONETOUCH VERIO test strip Use to check blood sugar twice daily.   pravastatin (PRAVACHOL) 40 MG tablet TAKE ONE TABLET BY MOUTH EVERYDAY AT BEDTIME   Semaglutide, 1 MG/DOSE, (OZEMPIC, 1 MG/DOSE,) 4 MG/3ML SOPN Inject 1 mg into the skin once a week. Via [employer] patient assistance   XARELTO 20 MG TABS tablet Take 1 tablet by mouth once daily   No facility-administered encounter medications on file as of 12/07/2021.    Allergies (verified) Amlodipine, Atorvastatin, Ampicillin, Codeine, Lisinopril,  Penicillins, and Januvia [sitagliptin]   History: Past Medical History:  Diagnosis Date   Allergy    Anxiety    Pt. denies   Arthritis    right index figer   Asymptomatic cholelithiasis    Atherosclerosis of aorta (HCC)    Cataract    Clotting disorder ([place])    Gallstones 06/2013   GERD (gastroesophageal reflux disease)    Glaucoma    "A LITTLE BIT"   History of kidney stones    lithotrispy   Hyperlipidemia    Hypertension    Hypothyroidism    no meds   Iron deficiency anemia    PAD (peripheral artery disease) (HCC)    PAF (paroxysmal atrial fibrillation) (HCC)    Peripheral arterial occlusive disease (HCC)    lower extremities   Pneumonia    PONV (postoperative nausea and vomiting)    Right ureteral stone    Type 2 diabetes mellitus ([place])    Type   Vitamin B 12 deficiency    Wears glasses    Past Surgical History:  Procedure Laterality Date   ABDOMINAL AORTOGRAM W/LOWER EXTREMITY N/A 03/09/2020   Procedure: ABDOMINAL AORTOGRAM W/LOWER EXTREMITY;  Surgeon: Serafina Mitchell, MD;  Location: [place] [facility];  Service: Cardiovascular;  Laterality: N/A;   AMPUTATION Left 07/16/2014   Procedure: LEFT SECOND TOE AMPUTATION;  Surgeon: Serafina Mitchell, MD;  Location: [place];  Service: Vascular;  Laterality: Left;  With Nerve block   ATRIAL FIBRILLATION ABLATION N/A 08/02/2021   Procedure: ATRIAL FIBRILLATION ABLATION;  Surgeon: Constance Haw, MD;  Location: [place] [facility];  Service: Cardiovascular;  Laterality: N/A;   AUGMENTATION MAMMAPLASTY     BELPHAROPTOSIS REPAIR     eyelid lift   BIOPSY  10/14/2018   Procedure: BIOPSY;  Surgeon: Rush Landmark Telford Nab., MD;  Location: [place];  Service: Gastroenterology;;   BIOPSY  08/04/2019   Procedure: BIOPSY;  Surgeon: Irving Copas., MD;  Location: [place];  Service: Gastroenterology;;   CARDIOVERSION N/A 09/19/2019   Procedure: CARDIOVERSION;  Surgeon: Buford Dresser, MD;  Location: [place];  Service: Cardiovascular;  Laterality: N/A;   COLONOSCOPY     COMBINED AUGMENTATION MAMMAPLASTY AND ABDOMINOPLASTY  2009   W/  BILATERAL  THIGH LIFT   CYSTO/  RIGHT URETERAL STENT PLACEMENT  12/30/2010   CYSTOSCOPY WITH RETROGRADE PYELOGRAM, URETEROSCOPY AND STENT PLACEMENT Right 10/07/2013   Procedure: CYSTOSCOPY WITH RETROGRADE PYELOGRAM, right URETEROSCOPY AND STENT PLACEMENT, stone extraction;  Surgeon: Arvil Persons, MD;  Location[facility];  Service: Urology;  Laterality: Right;   CYSTOSCOPY WITH STENT PLACEMENT Right 06/04/2013   Procedure: CYSTOSCOPY WITH STENT PLACEMENT;  Surgeon: Franchot Gallo, MD;  [facility];  Service: Urology;  Laterality: Right;   DILATATION & CURETTAGE/HYSTEROSCOPY WITH MYOSURE N/A 04/27/2015   Procedure: DILATATION & CURETTAGE/HYSTEROSCOPY WITH MYOSURE;  Surgeon: Terrance Mass, MD;  Location: [place] [facility];  Service: Gynecology;  Laterality: N/A;   ENDARTERECTOMY FEMORAL Left 06/08/2014   Procedure: Left Leg Common Femoral and External Iliac  Endartarectomy with patch Angioplasty;  Surgeon: Rosetta Posner, MD;  Location: [facility];  Service: Vascular;  Laterality: Left;   ESOPHAGOGASTRODUODENOSCOPY N/A 10/14/2018   Procedure: ESOPHAGOGASTRODUODENOSCOPY (EGD);  Surgeon: Irving Copas., MD;  Location: [place];  Service: Gastroenterology;  Laterality: N/A;   ESOPHAGOGASTRODUODENOSCOPY (EGD) WITH PROPOFOL N/A 11/06/2018   Procedure: ESOPHAGOGASTRODUODENOSCOPY (EGD) WITH PROPOFOL;  Surgeon: Rush Landmark Telford Nab., MD;  [facility];  Service: Gastroenterology;  Laterality: N/A;  RFA   ESOPHAGOGASTRODUODENOSCOPY (EGD) WITH PROPOFOL N/A 02/12/2019   Procedure: ESOPHAGOGASTRODUODENOSCOPY (EGD) WITH PROPOFOL;  Surgeon: Rush Landmark Telford Nab., MD;  [facility];  Service: Gastroenterology;  Laterality: N/A;   ESOPHAGOGASTRODUODENOSCOPY (EGD) WITH PROPOFOL N/A 08/04/2019   Procedure: ESOPHAGOGASTRODUODENOSCOPY  (EGD) WITH PROPOFOL;  Surgeon: Rush Landmark Telford Nab., MD;  Location: [place];  Service: Gastroenterology;  Laterality: N/A;  WITH RFA   EXTRACORPOREAL SHOCK WAVE LITHOTRIPSY Right 08-04-2013//   06-23-2013//   01-16-2011   EYE SURGERY Bilateral    cataract   FEMORAL-POPLITEAL BYPASS GRAFT Left 06/08/2014   Procedure: Left Leg Femoral -Popliteal Bypass Graft;  Surgeon: Rosetta Posner, MD;  Location: [facility];  Service: Vascular;  Laterality: Left;   FEMORAL-POPLITEAL BYPASS GRAFT Left 06/09/2014   Procedure: Left Femoral and Popliteal Exposure; Left Femoral to Anterior Tibial Bypass Graft using Propaten 42m by 80cm Goretex Graft; Left Tibial Endarterectomy; Left Femoraland Popliteal Thrombectomy ;  Surgeon: VSerafina Mitchell MD;  [place]  Service: Vascular;  Laterality: Left;   GI RADIOFREQUENCY ABLATION N/A 11/06/2018   Procedure: GI RADIOFREQUENCY ABLATION;  Surgeon: MIrving Copas, MD;  [facility];  Service: Gastroenterology;  Laterality: N/A;   GI RADIOFREQUENCY ABLATION N/A 02/12/2019   Procedure: GI RADIOFREQUENCY ABLATION;  Surgeon: MRush LandmarkGTelford Nab, MD;  [facility];  Service: Gastroenterology;  Laterality: N/A;   GI RADIOFREQUENCY ABLATION N/A 08/04/2019   Procedure: GI RADIOFREQUENCY ABLATION;  Surgeon: MRush LandmarkGTelford Nab, MD;  [place]  Service: Gastroenterology;  Laterality: N/A;   HOLMIUM LASER APPLICATION Right [id number]  Procedure: HOLMIUM LASER APPLICATION;  Surgeon: MArvil Persons MD;  [facility]  Service: Urology;  Laterality: Right;   KIDNEY STONE SURGERY  07/2013   1-2 stones   LAPAROSCOPIC GASTRIC BANDING  05/29/2005   LITHOTRIPSY  2-3 times   LOWER EXTREMITY ANGIOGRAM N/A 06/04/2014   Procedure: LOWER EXTREMITY ANGIOGRAM;  Surgeon: VSerafina Mitchell MD;  [facility];  Service: Cardiovascular;  Laterality: N/A;   ORIF FIFTH METACARPAL FX  RIGHT HAND  04/21/2002    POLYPECTOMY     REVISION AND RE-SITING LAP-BAND PORT  04/08/2010   W/  UPPER EGD   RIGHT KNEE PATELLECTOMY W/ REPAIR OF EXTENSOR MECHANISM  04/14/2002   TEE WITHOUT CARDIOVERSION N/A 12/24/2019   Procedure: TRANSESOPHAGEAL ECHOCARDIOGRAM (TEE);  Surgeon: TSueanne Margarita MD;  [facility];  Service: Cardiovascular;  Laterality: N/A;   Toenail removed Left 05/21/2014   2nd toenail-  Dr. PBarkley Bruns  Family History  Problem Relation Age of Onset   Kidney disease Mother    Hyperlipidemia Mother    Other Mother        AAA   and    Amputation   Diabetes Father    Heart disease Father    Deep vein thrombosis Father    Hyperlipidemia Father    Diabetes Sister  Heart disease Sister    Deep vein thrombosis Sister    Hyperlipidemia Sister    Diabetes Brother    Heart disease Brother    Cancer Brother    Hyperlipidemia Brother    Diabetes Brother    Diabetes Brother    Colon polyps Brother    Colon cancer Maternal Aunt    Arthritis Other    Cancer Other        colon   Hypertension Other    Stroke Other    Esophageal cancer Neg Hx    Stomach cancer Neg Hx    Inflammatory bowel disease Neg Hx    Liver disease Neg Hx    Pancreatic cancer Neg Hx    Crohn's disease Neg Hx    Rectal cancer Neg Hx    Social History   Socioeconomic History   Marital status: Single    Spouse name: Not on file   Number of children: Not on file   Years of education: Not on file   Highest education level: Not on file  Occupational History   Occupation: Quarry manager: [employer]  Tobacco Use   Smoking status: Former    Packs/day: 1.00    Years: 28.00    Total pack years: 28.00    Types: Cigarettes    Quit date: 09/05/2001    Years since quitting: 20.2    Passive exposure: Never   Smokeless tobacco: Never  Vaping Use   Vaping Use: Never used  Substance and Sexual Activity   Alcohol use: No    Alcohol/week: 0.0 standard drinks of alcohol   Drug use: No   Sexual  activity: Yes  Other Topics Concern   Not on file  Social History Narrative   Regular exercise- yes   Social Determinants of Health   Financial Resource Strain: Low Risk  (12/07/2021)   Overall Financial Resource Strain (CARDIA)    Difficulty of Paying Living Expenses: Not hard at all  Food Insecurity: No Food Insecurity (12/07/2021)   Hunger Vital Sign    Worried About Running Out of Food in the Last Year: Never true    Ran Out of Food in the Last Year: Never true  Transportation Needs: No Transportation Needs (12/07/2021)   PRAPARE - Hydrologist (Medical): No    Lack of Transportation (Non-Medical): No  Physical Activity: Inactive (12/07/2021)   Exercise Vital Sign    Days of Exercise per Week: 0 days    Minutes of Exercise per Session: 0 min  Stress: No Stress Concern Present (12/07/2021)   [place]    Feeling of Stress : Not at all  Social Connections: Moderately Integrated (12/07/2021)   Social Connection and Isolation Panel [NHANES]    Frequency of Communication with Friends and Family: More than three times a week    Frequency of Social Gatherings with Friends and Family: More than three times a week    Attends Religious Services: More than 4 times per year    Active Member of [employer] or Organizations: Yes    Attends Music therapist: More than 4 times per year    Marital Status: Never married    Tobacco Counseling Counseling given: Not Answered   Clinical Intake:  Pre-visit preparation completed: Yes  Pain : No/denies pain Pain Score: 0-No pain     BMI - recorded: 28.83 Nutritional Status: BMI 25 -29 Overweight Nutritional Risks:  None Diabetes: Yes CBG done?: No Did pt. bring in CBG monitor from home?: No  How often do you need to have someone help you when you read instructions, pamphlets, or other written materials from your doctor or pharmacy?: 1 -  Never What is the last grade level you completed in school?: HSG  Diabetic? yes  Interpreter Needed?: No  Information entered by :: Lisette Abu, LPN.   Activities of Daily Living    12/07/2021   11:13 AM 08/02/2021    9:07 AM  In your present state of health, do you have any difficulty performing the following activities:  Hearing? 0   Vision? 0   Difficulty concentrating or making decisions? 0   Walking or climbing stairs? 0 0  Dressing or bathing? 0   Doing errands, shopping? 0   Preparing Food and eating ? N   Using the Toilet? N   In the past six months, have you accidently leaked urine? N   Do you have problems with loss of bowel control? N   Managing your Medications? N   Managing your Finances? N   Housekeeping or managing your Housekeeping? N     Patient Care Team: [name], MD as PCP - General Marlou Porch Thana Farr, MD as PCP - Cardiology (Cardiology) Constance Haw, MD as PCP - Electrophysiology (Cardiology) Inocencio Homes, DPM as Consulting Physician (Podiatry) Charlton Haws, [facility] as Pharmacist (Pharmacist)  Indicate any recent Medical Services you may have received from other than [facility] providers in the past year (date may be approximate).     Assessment:   This is a routine wellness examination for Theresa Harrington.  Hearing/Vision screen Hearing Screening - Comments:: Patient denied any hearing difficulty.   No hearing aids.  Vision Screening - Comments:: Patient does wear readers.  [facility] done by: [facility]   Dietary issues and exercise activities discussed: Current Exercise Habits: The patient has a physically strenuous job, but has no regular exercise apart from work., Exercise limited by: None identified   Goals Addressed             This Visit's Progress    Client will verbalize knowledge of diabetes self-management as evidenced by Hgb A1C <7 or as defined by provider.            Depression Screen    12/07/2021   11:11 AM  06/23/2021    9:53 AM 01/14/2021   11:27 AM 06/03/2020    8:45 AM 04/22/2020    9:39 AM 02/11/2020    1:24 PM 01/08/2019   11:00 AM  PHQ 2/9 Scores  PHQ - 2 Score 0 0 0 0 0 0 0  PHQ- 9 Score  0  0 0  1    Fall Risk    12/07/2021   11:12 AM 01/14/2021   11:27 AM 06/03/2020    8:26 AM 01/08/2019   11:00 AM 11/02/2017    9:57 AM  Fall Risk   Falls in the past year? 0 0 0 0 No  Number falls in past yr: 0 0 0 0   Injury with Fall? 0 0 0 0   Risk for fall due to : No Fall Risks  No Fall Risks    Follow up Falls evaluation completed   Falls evaluation completed     FALL RISK PREVENTION PERTAINING TO THE HOME:  Any stairs in or around the home? Yes  If so, are there any without handrails? No  Home  free of loose throw rugs in walkways, pet beds, electrical cords, etc? Yes  Adequate lighting in your home to reduce risk of falls? Yes   ASSISTIVE DEVICES UTILIZED TO PREVENT FALLS:  Life alert? No  Use of a cane, walker or w/c? No  Grab bars in the bathroom? Yes  Shower chair or bench in shower? Yes  Elevated toilet seat or a handicapped toilet? Yes   TIMED UP AND GO:  Was the test performed? Yes .  Length of time to ambulate 10 feet: 6 sec.   Gait steady and fast with assistive device  Cognitive Function:        12/07/2021   11:13 AM  6CIT Screen  What Year? 0 points  What month? 0 points  What time? 0 points  Count back from 20 0 points  Months in reverse 0 points  Repeat phrase 0 points  Total Score 0 points    Immunizations Immunization History  Administered Date(s) Administered   Influenza Split 04/03/2012   Influenza Whole 02/13/2013   Influenza,inj,Quad PF,6+ Mos 02/05/2014, 01/27/2015, 02/03/2016, 02/01/2017, 03/05/2018, 01/08/2019, 01/07/2020, 12/30/2020   Moderna SARS-COV2 Booster Vaccination 02/23/2020, 11/12/2020   Moderna Sars-Covid-2 Vaccination 07/02/2019, 07/29/2019   PPD Test 05/07/2013   Pneumococcal Conjugate-13 06/25/2014   Pneumococcal  Polysaccharide-23 12/21/2011, 05/10/2017   Td 08/05/2008   Tdap 10/15/2017   Zoster Recombinat (Shingrix) 01/25/2019, 03/29/2019    TDAP status: Up to date  Flu Vaccine status: Up to date  Pneumococcal vaccine status: Up to date  Covid-19 vaccine status: Completed vaccines  Qualifies for Shingles Vaccine? Yes   Zostavax completed No   Shingrix Completed?: Yes  Screening Tests Health Maintenance  Topic Date Due   PAP SMEAR-Modifier  03/04/2018   COVID-19 Vaccine (3 - Moderna series) 01/07/2021   COLONOSCOPY (Pts 45-33yr Insurance coverage will need to be confirmed)  07/10/2021   INFLUENZA VACCINE  11/29/2021   Diabetic kidney evaluation - Urine ACR  12/30/2021   HEMOGLOBIN A1C  12/21/2021   FOOT EXAM  12/30/2021   Diabetic kidney evaluation - GFR measurement  07/16/2022   OPHTHALMOLOGY EXAM  08/25/2022   MAMMOGRAM  07/01/2023   TETANUS/TDAP  10/16/2027   Hepatitis C Screening  Completed   HIV Screening  Completed   Zoster Vaccines- Shingrix  Completed   HPV VACCINES  Aged Out   Fecal DNA (Cologuard)  Discontinued    Health Maintenance  Health Maintenance Due  Topic Date Due   PAP SMEAR-Modifier  03/04/2018   COVID-19 Vaccine (3 - Moderna series) 01/07/2021   COLONOSCOPY (Pts 45-42yrInsurance coverage will need to be confirmed)  07/10/2021   INFLUENZA VACCINE  11/29/2021   Diabetic kidney evaluation - Urine ACR  12/30/2021    Colorectal cancer screening: Type of screening: Colonoscopy. Completed 07/10/2021. Repeat every 3 years (Scheduled for 12/2021)  Mammogram status: Completed 06/30/2021. Repeat every year  Bone Density status: never done  Lung Cancer Screening: (Low Dose CT Chest recommended if Age 65-80ears, 30 pack-year currently smoking OR have quit w/in 15years.) does not qualify.   Lung Cancer Screening Referral: no  Additional Screening:  Hepatitis C Screening: does qualify; Completed 10/08/2018  Vision Screening: Recommended annual ophthalmology  exams for early detection of glaucoma and other disorders of the eye. Is the patient up to date with their annual eye exam?  Yes  Who is the provider or what is the name of the office in which the patient attends annual eye exams? [facility] If  pt is not established with a provider, would they like to be referred to a provider to establish care? No .   Dental Screening: Recommended annual dental exams for proper oral hygiene  Community Resource Referral / Chronic Care Management: CRR required this visit?  No   CCM required this visit?  No      Plan:     I have personally reviewed and noted the following in the patient's chart:   Medical and social history Use of alcohol, tobacco or illicit drugs  Current medications and supplements including opioid prescriptions.  Functional ability and status Nutritional status Physical activity Advanced directives List of other physicians Hospitalizations, surgeries, and ER visits in previous 12 months Vitals Screenings to include cognitive, depression, and falls Referrals and appointments  In addition, I have reviewed and discussed with patient certain preventive protocols, quality metrics, and best practice recommendations. A written personalized care plan for preventive services as well as general preventive health recommendations were provided to patient.     Sheral Flow, LPN   06/03/7626   Nurse Notes:  Hearing Screening - Comments:: Patient denied any hearing difficulty.   No hearing aids.  Vision Screening - Comments:: Patient does wear readers.  Eye exam done by: [facility]

## 2021-12-08 ENCOUNTER — Other Ambulatory Visit: Payer: Self-pay

## 2021-12-08 DIAGNOSIS — I10 Essential (primary) hypertension: Secondary | ICD-10-CM

## 2021-12-08 MED ORDER — PEN NEEDLES 32G X 4 MM MISC: 1.0000 "pen " | Freq: Four times a day (QID) | 3 refills | Status: DC

## 2021-12-08 NOTE — Telephone Encounter (Signed)
Pt request faxed

## 2021-12-12 ENCOUNTER — Other Ambulatory Visit: Payer: Self-pay | Admitting: Internal Medicine

## 2021-12-12 DIAGNOSIS — E1159 Type 2 diabetes mellitus with other circulatory complications: Secondary | ICD-10-CM

## 2021-12-12 DIAGNOSIS — Z794 Long term (current) use of insulin: Secondary | ICD-10-CM

## 2021-12-14 ENCOUNTER — Ambulatory Visit (HOSPITAL_COMMUNITY)
Admission: RE | Admit: 2021-12-14 | Discharge: 2021-12-14 | Disposition: A | Discharge status: Home / Self Care | Payer: PPO | Source: Ambulatory Visit | Attending: Nurse Practitioner | Admitting: Nurse Practitioner

## 2021-12-14 ENCOUNTER — Other Ambulatory Visit (HOSPITAL_COMMUNITY): Payer: Self-pay | Admitting: Nurse Practitioner

## 2021-12-14 ENCOUNTER — Encounter (HOSPITAL_COMMUNITY): Payer: Self-pay | Admitting: Nurse Practitioner

## 2021-12-14 VITALS — BP 162/96 | HR 117 | Ht 64.5 in | Wt 171.4 lb

## 2021-12-14 DIAGNOSIS — D6869 Other thrombophilia: Secondary | ICD-10-CM | POA: Diagnosis not present

## 2021-12-14 DIAGNOSIS — I10 Essential (primary) hypertension: Secondary | ICD-10-CM | POA: Insufficient documentation

## 2021-12-14 DIAGNOSIS — I4819 Other persistent atrial fibrillation: Secondary | ICD-10-CM | POA: Diagnosis not present

## 2021-12-14 DIAGNOSIS — I4891 Unspecified atrial fibrillation: Secondary | ICD-10-CM

## 2021-12-14 DIAGNOSIS — I4892 Unspecified atrial flutter: Secondary | ICD-10-CM | POA: Diagnosis not present

## 2021-12-14 LAB — BASIC METABOLIC PANEL
Anion gap: 7 (ref 5–15)
BUN: 10 mg/dL (ref 8–23)
CO2: 27 mmol/L (ref 22–32)
Calcium: 9.3 mg/dL (ref 8.9–10.3)
Chloride: 107 mmol/L (ref 98–111)
Creatinine, Ser: 0.86 mg/dL (ref 0.44–1.00)
GFR, Estimated: >60 mL/min (ref >60)
Glucose, Bld: 263 mg/dL — ABNORMAL HIGH (ref 70–99)
Potassium: 4.5 mmol/L (ref 3.5–5.1)
Sodium: 141 mmol/L (ref 135–145)

## 2021-12-14 LAB — CBC
HCT: 43.3 % (ref 36.0–46.0)
Hemoglobin: 13.9 g/dL (ref 12.0–15.0)
MCH: 28.6 pg (ref 26.0–34.0)
MCHC: 32.1 g/dL (ref 30.0–36.0)
MCV: 89.1 fL (ref 80.0–100.0)
Platelets: 304 10*3/uL (ref 150–400)
RBC: 4.86 MIL/uL (ref 3.87–5.11)
RDW: 14.9 % (ref 11.5–15.5)
WBC: 7.8 10*3/uL (ref 4.0–10.5)
nRBC: 0 % (ref 0.0–0.2)

## 2021-12-14 MED ORDER — DILTIAZEM HCL 30 MG PO TABS: ORAL_TABLET | ORAL | 1 refills | Status: DC

## 2021-12-14 NOTE — Progress Notes (Addendum)
Primary Care Physician: [name], MD Referring Physician:Dr. Leroy Libman Gasner is a 66 y.o. female with a h/o afib  with ablation 08/02/21. She had covid end of July and then had  a personal matter that was causing stress and noted onset RVR since Monday. She has doubled up on her Bystolic  but remains in atrial flutter at 117 bpm.  She is not symptomatic with it. BP stable at home. Elevated here. No evidence of fluid rentention.   Today, she denies symptoms of chest pain, shortness of breath, orthopnea, PND, lower extremity edema, dizziness, presyncope, syncope, or neurologic sequela. The patient is tolerating medications without difficulties and is otherwise without complaint today.   Past Medical History:  Diagnosis Date   Allergy    Anxiety    Pt. denies   Arthritis    right index figer   Asymptomatic cholelithiasis    Atherosclerosis of aorta (HCC)    Cataract    Clotting disorder ([place])    Gallstones 06/2013   GERD (gastroesophageal reflux disease)    Glaucoma    "A LITTLE BIT"   History of kidney stones    lithotrispy   Hyperlipidemia    Hypertension    Hypothyroidism    no meds   Iron deficiency anemia    PAD (peripheral artery disease) (HCC)    PAF (paroxysmal atrial fibrillation) (HCC)    Peripheral arterial occlusive disease (HCC)    lower extremities   Pneumonia    PONV (postoperative nausea and vomiting)    Right ureteral stone    Type 2 diabetes mellitus ([place])    Type   Vitamin B 12 deficiency    Wears glasses    Past Surgical History:  Procedure Laterality Date   ABDOMINAL AORTOGRAM W/LOWER EXTREMITY N/A 03/09/2020   Procedure: ABDOMINAL AORTOGRAM W/LOWER EXTREMITY;  Surgeon: Serafina Mitchell, MD;  Location: [facility];  Service: Cardiovascular;  Laterality: N/A;   AMPUTATION Left 07/16/2014   Procedure: LEFT SECOND TOE AMPUTATION;  Surgeon: Serafina Mitchell, MD;  Location: [place];  Service: Vascular;  Laterality: Left;  With Nerve block    ATRIAL FIBRILLATION ABLATION N/A 08/02/2021   Procedure: ATRIAL FIBRILLATION ABLATION;  Surgeon: Constance Haw, MD;  Location: [facility];  Service: Cardiovascular;  Laterality: N/A;   AUGMENTATION MAMMAPLASTY     BELPHAROPTOSIS REPAIR     eyelid lift   BIOPSY  10/14/2018   Procedure: BIOPSY;  Surgeon: Rush Landmark Telford Nab., MD;  Location: [facility] [place];  Service: Gastroenterology;;   BIOPSY  08/04/2019   Procedure: BIOPSY;  Surgeon: Irving Copas., MD;  Location: [place];  Service: Gastroenterology;;   CARDIOVERSION N/A 09/19/2019   Procedure: CARDIOVERSION;  Surgeon: Buford Dresser, MD;  Location: [facility];  Service: Cardiovascular;  Laterality: N/A;   COLONOSCOPY     COMBINED AUGMENTATION MAMMAPLASTY AND ABDOMINOPLASTY  2009   W/  BILATERAL  THIGH LIFT   CYSTO/  RIGHT URETERAL STENT PLACEMENT  12/30/2010   CYSTOSCOPY WITH RETROGRADE PYELOGRAM, URETEROSCOPY AND STENT PLACEMENT Right 10/07/2013   Procedure: CYSTOSCOPY WITH RETROGRADE PYELOGRAM, right URETEROSCOPY AND STENT PLACEMENT, stone extraction;  Surgeon: Arvil Persons, MD;  Location[facility];  Service: Urology;  Laterality: Right;   CYSTOSCOPY WITH STENT PLACEMENT Right 06/04/2013   Procedure: CYSTOSCOPY WITH STENT PLACEMENT;  Surgeon: Franchot Gallo, MD;  [facility];  Service: Urology;  Laterality: Right;   DILATATION & CURETTAGE/HYSTEROSCOPY WITH MYOSURE N/A 04/27/2015  Procedure: DILATATION & CURETTAGE/HYSTEROSCOPY WITH MYOSURE;  Surgeon: Terrance Mass, MD;  Location: [facility];  Service: Gynecology;  Laterality: N/A;   ENDARTERECTOMY FEMORAL Left 06/08/2014   Procedure: Left Leg Common Femoral and External Iliac  Endartarectomy with patch Angioplasty;  Surgeon: Rosetta Posner, MD;  Location: [facility];  Service: Vascular;  Laterality: Left;   ESOPHAGOGASTRODUODENOSCOPY N/A 10/14/2018   Procedure: ESOPHAGOGASTRODUODENOSCOPY (EGD);  Surgeon: Irving Copas.,  MD;  Location: [place];  Service: Gastroenterology;  Laterality: N/A;   ESOPHAGOGASTRODUODENOSCOPY (EGD) WITH PROPOFOL N/A 11/06/2018   Procedure: ESOPHAGOGASTRODUODENOSCOPY (EGD) WITH PROPOFOL;  Surgeon: Rush Landmark Telford Nab., MD;  [facility];  Service: Gastroenterology;  Laterality: N/A;  RFA   ESOPHAGOGASTRODUODENOSCOPY (EGD) WITH PROPOFOL N/A 02/12/2019   Procedure: ESOPHAGOGASTRODUODENOSCOPY (EGD) WITH PROPOFOL;  Surgeon: Rush Landmark Telford Nab., MD;  [facility];  Service: Gastroenterology;  Laterality: N/A;   ESOPHAGOGASTRODUODENOSCOPY (EGD) WITH PROPOFOL N/A 08/04/2019   Procedure: ESOPHAGOGASTRODUODENOSCOPY (EGD) WITH PROPOFOL;  Surgeon: Rush Landmark Telford Nab., MD;  Location: [place];  Service: Gastroenterology;  Laterality: N/A;  WITH RFA   EXTRACORPOREAL SHOCK WAVE LITHOTRIPSY Right 08-04-2013//   06-23-2013//   01-16-2011   EYE SURGERY Bilateral    cataract   FEMORAL-POPLITEAL BYPASS GRAFT Left 06/08/2014   Procedure: Left Leg Femoral -Popliteal Bypass Graft;  Surgeon: Rosetta Posner, MD;  Location: [facility];  Service: Vascular;  Laterality: Left;   FEMORAL-POPLITEAL BYPASS GRAFT Left 06/09/2014   Procedure: Left Femoral and Popliteal Exposure; Left Femoral to Anterior Tibial Bypass Graft using Propaten 74m by 80cm Goretex Graft; Left Tibial Endarterectomy; Left Femoraland Popliteal Thrombectomy ;  Surgeon: VSerafina Mitchell MD;  [place]  Service: Vascular;  Laterality: Left;   GI RADIOFREQUENCY ABLATION N/A 11/06/2018   Procedure: GI RADIOFREQUENCY ABLATION;  Surgeon: MIrving Copas, MD;  [facility];  Service: Gastroenterology;  Laterality: N/A;   GI RADIOFREQUENCY ABLATION N/A 02/12/2019   Procedure: GI RADIOFREQUENCY ABLATION;  Surgeon: MRush LandmarkGTelford Nab, MD;  [facility];  Service: Gastroenterology;  Laterality: N/A;   GI RADIOFREQUENCY ABLATION N/A 08/04/2019   Procedure: GI RADIOFREQUENCY ABLATION;   Surgeon: MRush LandmarkGTelford Nab, MD;  [place]  Service: Gastroenterology;  Laterality: N/A;   HOLMIUM LASER APPLICATION Right [id number]/[id number]  Procedure: HOLMIUM LASER APPLICATION;  Surgeon: MArvil Persons MD;  [facility]  Service: Urology;  Laterality: Right;   KIDNEY STONE SURGERY  07/2013   1-2 stones   LAPAROSCOPIC GASTRIC BANDING  05/29/2005   LITHOTRIPSY  2-3 times   LOWER EXTREMITY ANGIOGRAM N/A 06/04/2014   Procedure: LOWER EXTREMITY ANGIOGRAM;  Surgeon: VSerafina Mitchell MD;  [facility];  Service: Cardiovascular;  Laterality: N/A;   ORIF FIFTH METACARPAL FX  RIGHT HAND  04/21/2002   POLYPECTOMY     REVISION AND RE-SITING LAP-BAND PORT  04/08/2010   W/  UPPER EGD   RIGHT KNEE PATELLECTOMY W/ REPAIR OF EXTENSOR MECHANISM  04/14/2002   TEE WITHOUT CARDIOVERSION N/A 12/24/2019   Procedure: TRANSESOPHAGEAL ECHOCARDIOGRAM (TEE);  Surgeon: TSueanne Margarita MD;  [facility];  Service: Cardiovascular;  Laterality: N/A;   Toenail removed Left 05/21/2014   2nd toenail-  Dr. PBarkley Bruns   Current Outpatient Medications  Medication Sig Dispense Refill   Blood Glucose Monitoring Suppl (ONETOUCH VERIO IQ SYSTEM) w/Device KIT USE TO CHECK SUGAR DAILY 1 kit 0   cetirizine (ZYRTEC) 10 MG tablet Take 10 mg by mouth daily as needed for allergies.  chlorpheniramine-HYDROcodone (TUSSIONEX PENNKINETIC ER) 10-8 MG/5ML Take 5 mLs by mouth every 12 (twelve) hours as needed for cough. (Patient not taking: Reported on 11/30/2021) 100 mL 0   dorzolamide-timolol (COSOPT) 22.3-6.8 MG/ML ophthalmic solution Place 1 drop into the right eye 2 (two) times daily.      ezetimibe (ZETIA) 10 MG tablet TAKE ONE TABLET BY MOUTH EVERYDAY AT BEDTIME 90 tablet 1   Glucagon (GVOKE HYPOPEN 2-PACK) 1 MG/0.2ML SOAJ Inject 1 Act into the skin daily as needed. (Patient not taking: Reported on 11/30/2021) 2 mL 5   Insulin Glargine (BASAGLAR KWIKPEN) 100 UNIT/ML Inject 50 Units  into the skin at bedtime. [place] patient assistance (Patient taking differently: Inject 40-42 Units into the skin at bedtime. [place] patient assistance taking 46 units) 30 mL 2   insulin lispro (HUMALOG) 100 UNIT/ML injection Inject 10-14 Units into the skin 3 (three) times daily before meals. [place] patient assistance Sliding scale     Insulin Pen Needle (PEN NEEDLES) 32G X 4 MM MISC Inject 1 pen  as directed 4 (four) times daily. Use to inject levemir or novolog as directed. 400 each 3   latanoprost (XALATAN) 0.005 % ophthalmic solution Place 1 drop into both eyes at bedtime.      losartan (COZAAR) 100 MG tablet TAKE ONE TABLET BY MOUTH EVERYDAY AT BEDTIME (Patient taking differently: daily.) 90 tablet 1   magnesium oxide (MAG-OX) 400 (240 Mg) MG tablet TAKE ONE TABLET BY MOUTH EVERYDAY AT BEDTIME 90 tablet 1   nebivolol (BYSTOLIC) 10 MG tablet Take 1 tablet (10 mg total) by mouth daily. 90 tablet 3   omeprazole (PRILOSEC) 40 MG capsule TAKE ONE CAPSULE BY MOUTH EVERY MORNING and TAKE ONE CAPSULE BY MOUTH EVERY EVENING 180 capsule 1   ONETOUCH VERIO test strip Use to check blood sugar twice daily. 200 strip 3   pravastatin (PRAVACHOL) 40 MG tablet TAKE ONE TABLET BY MOUTH EVERYDAY AT BEDTIME 90 tablet 1   Semaglutide, 1 MG/DOSE, (OZEMPIC, 1 MG/DOSE,) 4 MG/3ML SOPN Inject 1 mg into the skin once a week. [employer] patient assistance 9 mL 1   SYNJARDY XR 12.08-998 MG TB24 Take 1 tablet by mouth once daily 90 tablet 0   XARELTO 20 MG TABS tablet Take 1 tablet by mouth once daily 90 tablet 0   No current facility-administered medications for this encounter.    Allergies  Allergen Reactions   Amlodipine Swelling   Atorvastatin Other (See Comments)    Muscle aches    Ampicillin Nausea And Vomiting and Other (See Comments)   Codeine Nausea And Vomiting    Ask patient   Lisinopril Cough        Penicillins Nausea And Vomiting    Did it involve swelling of the  face/tongue/throat, SOB, or low BP? No Did it involve sudden or severe rash/hives, skin peeling, or any reaction on the inside of your mouth or nose? No Did you need to seek medical attention at a hospital or doctor's office? No When did it last happen?      20+ years If all above answers are "NO", may proceed with cephalosporin use.    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  Tobacco Use   Smoking status: Former    Packs/day: 1.00    Years: 28.00    Total pack years: 28.00    Types: Cigarettes    Quit date: 09/05/2001    Years since quitting: 20.2    Passive exposure: Never   Smokeless tobacco: Never  Vaping Use   Vaping Use: Never used  Substance and Sexual Activity   Alcohol use: No    Alcohol/week: 0.0 standard drinks of alcohol   Drug use: No   Sexual activity: Yes  Other Topics Concern   Not on file  Social History Narrative   Regular exercise- yes   Social Determinants of Health   Financial Resource Strain: Low Risk  (12/07/2021)   Overall Financial Resource Strain (CARDIA)    Difficulty of Paying Living Expenses: Not hard at all  Food Insecurity: No Food Insecurity (12/07/2021)   Hunger Vital Sign    Worried About Running Out of Food in the Last Year: Never true    Ran Out of Food in the Last Year: Never true  Transportation Needs: No Transportation Needs (12/07/2021)   PRAPARE - Hydrologist (Medical): No    Lack of Transportation (Non-Medical): No  Physical Activity: Inactive (12/07/2021)   Exercise Vital Sign    Days of Exercise per Week: 0 days    Minutes of Exercise per Session: 0 min  Stress: No Stress Concern Present (12/07/2021)   [place]    Feeling of Stress : Not at all  Social Connections: Moderately Integrated (12/07/2021)   Social Connection and Isolation Panel [NHANES]    Frequency of Communication with Friends and Family: More than three times a week    Frequency of Social Gatherings with Friends and Family: More than three times a week    Attends Religious Services: More than 4 times per year    Active Member of [employer] or Organizations: Yes    Attends Music therapist: More than 4 times per year    Marital Status: Never married  Intimate Partner Violence: Not At Risk (12/07/2021)   Humiliation, Afraid, Rape, and Kick questionnaire    Fear of Current or Ex-Partner: No    Emotionally Abused: No    Physically Abused: No    Sexually Abused: No    Family History  Problem Relation Age of Onset   Kidney disease Mother    Hyperlipidemia Mother    Other Mother        AAA   and    Amputation   Diabetes Father    Heart disease Father    Deep vein thrombosis Father    Hyperlipidemia Father    Diabetes Sister    Heart disease Sister    Deep vein thrombosis Sister    Hyperlipidemia Sister    Diabetes Brother    Heart disease Brother    Cancer Brother    Hyperlipidemia Brother    Diabetes Brother    Diabetes Brother    Colon polyps Brother    Colon cancer Maternal Aunt    Arthritis Other    Cancer Other        colon   Hypertension Other    Stroke Other    Esophageal cancer Neg Hx    Stomach cancer Neg Hx    Inflammatory bowel disease Neg Hx    Liver  disease Neg Hx    Pancreatic cancer Neg Hx    Crohn's disease Neg Hx    Rectal cancer Neg Hx     ROS- All systems are reviewed and negative except as per the HPI above  Physical Exam: Vitals:   12/14/21 1025  BP: (!) 162/96  Pulse: (!) 117  Weight: 77.7 kg  Height: 5' 4.5" (1.638 m)   Wt Readings from Last 3 Encounters:  12/14/21 77.7 kg  12/07/21 77.4 kg  11/30/21 77.7 kg     Labs: Lab Results  Component Value Date   NA 139 07/15/2021   K 4.1 07/15/2021   CL 102 07/15/2021   CO2 25 07/15/2021   GLUCOSE 203 (H) 07/15/2021   BUN 14 07/15/2021   CREATININE 0.75 07/15/2021   CALCIUM 9.3 07/15/2021   MG 1.8 09/04/2019   Lab Results  Component Value Date   INR 1.3 (H) 03/08/2020   Lab Results  Component Value Date   CHOL 171 12/30/2020   HDL 55.70 12/30/2020   LDLCALC 89 12/30/2020   TRIG 130.0 12/30/2020     GEN- The patient is well appearing, alert and oriented x 3 today.   Head- normocephalic, atraumatic Eyes-  Sclera clear, conjunctiva pink Ears- hearing intact Oropharynx- clear Neck- supple, no JVP Lymph- no cervical lymphadenopathy Lungs- Clear to ausculation bilaterally, normal work of breathing Heart- Regular rate and rhythm, no murmurs, rubs or gallops, PMI not laterally displaced GI- soft, NT, ND, + BS Extremities- no clubbing, cyanosis, or edema MS- no significant deformity or atrophy Skin- no rash or lesion Psych- euthymic mood, full affect Neuro- strength and sensation are intact  EKG-Vent. rate 117 BPM PR interval * ms QRS duration 76 ms QT/QTcB 328/457 ms P-R-T axes 238 46 59 Atrial flutter with variable A-V block Abnormal ECG When compared with ECG of 11-Aug-2021 11:10, PREVIOUS ECG IS PRESENT Epic records reviewed     Assessment and Plan:  1. Atrial flutter  Doing well staying in SR post ablation in April 2023, until recent covid infection and stress from personal manner, has RVR but is not symptomatic   Continue Bystolic 10 mg daily as taking bid for last day  has not helped with v rates Try diltiazem 30 mg every 4 hours as needed for HR over 100 bpm and if systolic BP is over 938 ( states prior leg swelling with daily  high dose amlodipine, will watch for this issue on drug)  Scheduled for cardioversion 8/23, risk vrs benefit discussed,  to ER if condition worsens prior to that  Bmet/cbc  2. CHA2DS2VASc   score of 3 Continue xarelto 20 mg daily  States no missed doses x 3 weeks Is scheduled for routine colonoscopy 9/6. This will need to be rescheduled as she cannot interrupt anticoagulation for 4 weeks after cardioversion   3. HTN Elevated today states at home in range    F/u in afib clinic one week after cardioversion   Butch Penny C. Maurica Omura, [facility] [address] [place],  [address] [phone]

## 2021-12-14 NOTE — Addendum Note (Signed)
Encounter addended by: Sherran Needs, NP on: 12/14/2021 11:28 AM  Actions taken: Clinical Note Signed

## 2021-12-14 NOTE — Patient Instructions (Addendum)
Reschedule Colonoscopy   Take 1 Tablet Every 4 Hours As Needed For HR >100 And Top BP >100  Cardioversion scheduled for Wednesday August 23  - Arrive at the [facility] and go to admitting at 8:30am  - Do not eat or drink anything after midnight the night prior to your procedure.  - Take all your morning medication (except diabetic medications) with a sip of water prior to arrival.  - You will not be able to drive home after your procedure.  - Do NOT miss any doses of your blood thinner - if you should miss a dose please notify our office immediately.  - If you feel as if you go back into normal rhythm prior to scheduled cardioversion, please notify our office immediately. If your procedure is canceled in the cardioversion suite you will be charged a cancellation fee.

## 2021-12-15 ENCOUNTER — Other Ambulatory Visit (HOSPITAL_COMMUNITY): Payer: Self-pay | Admitting: *Deleted

## 2021-12-15 ENCOUNTER — Other Ambulatory Visit (HOSPITAL_COMMUNITY): Payer: Self-pay | Admitting: Nurse Practitioner

## 2021-12-15 ENCOUNTER — Other Ambulatory Visit: Payer: Self-pay | Admitting: Internal Medicine

## 2021-12-15 ENCOUNTER — Encounter (HOSPITAL_COMMUNITY): Payer: Self-pay | Admitting: Cardiovascular Disease

## 2021-12-15 DIAGNOSIS — I7025 Atherosclerosis of native arteries of other extremities with ulceration: Secondary | ICD-10-CM

## 2021-12-15 DIAGNOSIS — I739 Peripheral vascular disease, unspecified: Secondary | ICD-10-CM

## 2021-12-15 DIAGNOSIS — I4819 Other persistent atrial fibrillation: Secondary | ICD-10-CM

## 2021-12-15 DIAGNOSIS — E1151 Type 2 diabetes mellitus with diabetic peripheral angiopathy without gangrene: Secondary | ICD-10-CM

## 2021-12-16 ENCOUNTER — Encounter (HOSPITAL_COMMUNITY): Payer: Self-pay | Admitting: Anesthesiology

## 2021-12-16 ENCOUNTER — Encounter (HOSPITAL_COMMUNITY): Payer: Self-pay | Admitting: Cardiovascular Disease

## 2021-12-16 ENCOUNTER — Telehealth (HOSPITAL_COMMUNITY): Payer: Self-pay

## 2021-12-16 ENCOUNTER — Encounter (HOSPITAL_COMMUNITY): Admission: RE | Payer: Self-pay | Source: Home / Self Care

## 2021-12-16 ENCOUNTER — Ambulatory Visit (HOSPITAL_COMMUNITY): Admission: RE | Admit: 2021-12-16 | Payer: PPO | Source: Home / Self Care | Admitting: Cardiovascular Disease

## 2021-12-16 SURGERY — CARDIOVERSION
Anesthesia: General

## 2021-12-16 NOTE — Anesthesia Preprocedure Evaluation (Signed)
Anesthesia Evaluation    Reviewed: Allergy & Precautions, Patient's Chart, lab work & pertinent test results, reviewed documented beta blocker date and time   History of Anesthesia Complications (+) PONV and history of anesthetic complications  Airway        Dental   Pulmonary former smoker,           Cardiovascular hypertension, Pt. on medications and Pt. on home beta blockers + Peripheral Vascular Disease  + dysrhythmias Atrial Fibrillation + Valvular Problems/Murmurs    '22 Carotid US - 1-39% b/l ICAS  '22 TTE - EF 65 to 70%. There is mild left ventricular hypertrophy. Grade II diastolic dysfunction (pseudonormalization). Elevated left atrial pressure. Left atrial size was severely dilated. Right atrial size was mildly dilated. Mild to moderate mitral stenosis. MG 27mHg, MVA 1.4 cm^2 by continuity equation. Mild to moderate aortic valve sclerosis/calcification is present, without any evidence of aortic stenosis.     Neuro/Psych PSYCHIATRIC DISORDERS Anxiety    GI/Hepatic Neg liver ROS, GERD  Medicated and Controlled,  Endo/Other  diabetes, Type 2, Insulin Dependent, Oral Hypoglycemic AgentsHypothyroidism   Renal/GU negative Renal ROS     Musculoskeletal  (+) Arthritis ,   Abdominal   Peds  Hematology  On xarelto    Anesthesia Other Findings   Reproductive/Obstetrics                             Anesthesia Physical  Anesthesia Plan  ASA: 3  Anesthesia Plan: General   Post-op Pain Management: Minimal or no pain anticipated   Induction: Intravenous  PONV Risk Score and Plan: 4 or greater and Treatment may vary due to age or medical condition and TIVA  Airway Management Planned: Mask  Additional Equipment: None  Intra-op Plan:   Post-operative Plan: Extubation in OR  Informed Consent:   Plan Discussed with: Anesthesiologist  Anesthesia Plan Comments:          Anesthesia Quick Evaluation

## 2021-12-16 NOTE — Telephone Encounter (Signed)
Patient called in and states she is back in normal sinus rhythm. Her heart rate is running 60-72. Patient was scheduled for cardioversion today 8/18. Contacted Endo and cancelled her Cardioversion. Patient was told to contact us back if she has further A-fib. Communicated with patient and she verbalized understanding.

## 2021-12-21 ENCOUNTER — Telehealth: Payer: Self-pay | Admitting: Gastroenterology

## 2021-12-21 ENCOUNTER — Ambulatory Visit (HOSPITAL_COMMUNITY): Payer: PPO | Admitting: Nurse Practitioner

## 2021-12-21 NOTE — Telephone Encounter (Signed)
Pharmacy would not honor [employer] price that was listed on -line according to patient.  She cannot afford cost of Suprep ($80) Changed prep to split dose Miralax/Dulcolax and mailed new instructions to patient.

## 2021-12-21 NOTE — Telephone Encounter (Signed)
Patient called requesting we resend the prep script to a [facility] in [place] which ever one is fine.

## 2021-12-22 ENCOUNTER — Encounter: Payer: Self-pay | Admitting: Gastroenterology

## 2021-12-28 ENCOUNTER — Ambulatory Visit (HOSPITAL_COMMUNITY): Payer: PPO | Admitting: Nurse Practitioner

## 2021-12-28 ENCOUNTER — Encounter: Payer: Self-pay | Admitting: Certified Registered Nurse Anesthetist

## 2022-01-03 ENCOUNTER — Telehealth: Payer: Self-pay | Admitting: Gastroenterology

## 2022-01-03 NOTE — Telephone Encounter (Signed)
Inbound call from patient cancelling upcoming procedure for tomorrow. Patient states she is currently having conditions with her heart and will give [place] a call back to reschedule.

## 2022-01-04 ENCOUNTER — Ambulatory Visit (HOSPITAL_COMMUNITY): Payer: PPO | Admitting: Nurse Practitioner

## 2022-01-04 ENCOUNTER — Encounter: Payer: PPO | Admitting: Gastroenterology

## 2022-01-05 ENCOUNTER — Telehealth: Payer: Self-pay

## 2022-01-05 NOTE — Telephone Encounter (Signed)
LVM stating that pt assistance medication has arrived and ready for pick up.

## 2022-01-12 ENCOUNTER — Telehealth: Payer: Self-pay | Admitting: Cardiology

## 2022-01-12 NOTE — Telephone Encounter (Signed)
Spoke with pt and advised per Dr Curt Bears she should be seen in Afib clinic for further evaluation.  Pt verbalizes understanding and agrees with current plan.

## 2022-01-12 NOTE — Telephone Encounter (Signed)
Patient c/o Palpitations:  High priority if patient c/o lightheadedness, shortness of breath, or chest pain  How long have you had palpitations/irregular HR/ Afib? Are you having the symptoms now? Since 4:30 A.M.   Are you currently experiencing lightheadedness, SOB or CP? No  Do you have a history of afib (atrial fibrillation) or irregular heart rhythm? Yes  Have you checked your BP or HR? (document readings if available):  180/100 before meds 135 HR  150/70 after meds  between 67-120  Are you experiencing any other symptoms?  No   Pt states she is back in afib and would like to speak to someone. She states the meds given at afib clinic does not seem to be helping.

## 2022-01-12 NOTE — Telephone Encounter (Signed)
Pt s/p Afib ablation 07/2021.  Spoke with pt who reports she was in Afib/flutter this past weekend and went back to NSR after a couple of days.  Pt states she went back into Afib this morning.  She is taking her medications as prescribed.  She states she has had more episodes of Afib/flutter since having Covid in July.  She was seen in Afib clinic 12/14/2021 and a DCCV was scheduled but pt reverted prior to procedure.  Pt advised Dr Curt Bears and his RN are not in the office today. Pt states she took Diltiazem '30mg'$  this morning at 630am.  She is at work so she is unable to recheck her BP.  She states current HR between 70 and 120 with pulse ox.  She denies current CP, SOB or dizziness.  Pt advised to continue current medications.  May take another Diltiazem when she gets home if vital signs are within parameters provided in Afib clinic  Pt states she will contact office 01/13/22 if she remains in [place].  Will forward to Dr Curt Bears and Venida Jarvis, RN to make them aware.  Reviewed ED precautions.  Pt verbalizes understanding and agrees with current plan

## 2022-01-13 NOTE — Telephone Encounter (Signed)
Left message

## 2022-01-25 ENCOUNTER — Ambulatory Visit: Payer: PPO | Admitting: Internal Medicine

## 2022-02-04 ENCOUNTER — Other Ambulatory Visit: Payer: Self-pay | Admitting: Internal Medicine

## 2022-02-04 DIAGNOSIS — I4891 Unspecified atrial fibrillation: Secondary | ICD-10-CM

## 2022-02-14 ENCOUNTER — Other Ambulatory Visit: Payer: Self-pay | Admitting: *Deleted

## 2022-02-14 DIAGNOSIS — I7025 Atherosclerosis of native arteries of other extremities with ulceration: Secondary | ICD-10-CM

## 2022-02-15 ENCOUNTER — Other Ambulatory Visit: Payer: Self-pay | Admitting: Internal Medicine

## 2022-02-15 DIAGNOSIS — Z794 Long term (current) use of insulin: Secondary | ICD-10-CM

## 2022-02-18 DIAGNOSIS — R051 Acute cough: Secondary | ICD-10-CM | POA: Diagnosis not present

## 2022-02-18 DIAGNOSIS — R5383 Other fatigue: Secondary | ICD-10-CM | POA: Diagnosis not present

## 2022-02-18 DIAGNOSIS — J189 Pneumonia, unspecified organism: Secondary | ICD-10-CM | POA: Diagnosis not present

## 2022-02-20 ENCOUNTER — Telehealth: Payer: Self-pay

## 2022-02-20 NOTE — Telephone Encounter (Signed)
LVM--to call the office back regarding vaccines and symptoms.

## 2022-02-20 NOTE — Telephone Encounter (Signed)
Patient is calling in stating she currently has pneumonia. Wants to know if she is due for pneumonia shot? When is the dead line to get a flu vaccine and how soon after recovering should she receive the vaccine?

## 2022-02-21 DIAGNOSIS — Z8631 Personal history of diabetic foot ulcer: Secondary | ICD-10-CM | POA: Diagnosis not present

## 2022-02-21 DIAGNOSIS — Z89422 Acquired absence of other left toe(s): Secondary | ICD-10-CM | POA: Diagnosis not present

## 2022-02-21 DIAGNOSIS — E1151 Type 2 diabetes mellitus with diabetic peripheral angiopathy without gangrene: Secondary | ICD-10-CM | POA: Diagnosis not present

## 2022-02-21 NOTE — Telephone Encounter (Signed)
Pt returned call regarding vaccines and symptoms.

## 2022-02-22 ENCOUNTER — Telehealth: Payer: Self-pay

## 2022-02-22 ENCOUNTER — Ambulatory Visit: Payer: PPO | Admitting: Internal Medicine

## 2022-02-22 NOTE — Telephone Encounter (Signed)
Mailed Novo Nordisk renewal application to patient home. 

## 2022-02-25 DIAGNOSIS — R5383 Other fatigue: Secondary | ICD-10-CM | POA: Diagnosis not present

## 2022-02-25 DIAGNOSIS — J189 Pneumonia, unspecified organism: Secondary | ICD-10-CM | POA: Diagnosis not present

## 2022-02-27 ENCOUNTER — Telehealth: Payer: Self-pay

## 2022-02-27 ENCOUNTER — Ambulatory Visit: Payer: PPO | Admitting: Surgery

## 2022-02-27 ENCOUNTER — Ambulatory Visit (HOSPITAL_COMMUNITY): Payer: PPO

## 2022-02-27 NOTE — Telephone Encounter (Signed)
Patient called back and I told her that she would need 2 copies of her proof of income for the [place] and her humalog and that she would need to complete her part of the [facility] Patient Assistance program application..  Patient states that she will comply with the request.

## 2022-02-27 NOTE — Telephone Encounter (Signed)
LVM stating re-enrollment is due  I need 2 copies of proof of income for applications for Basaglar and Humalog  Pt also need to complete her portions of the applications

## 2022-03-02 ENCOUNTER — Telehealth: Payer: Self-pay

## 2022-03-02 NOTE — Telephone Encounter (Signed)
Pt has came by the office and completed pt portion and brought copies of income verification.   I have faxed both applications to [facility] with successful confirmation.

## 2022-03-02 NOTE — Telephone Encounter (Signed)
Called pt and talked to them pt stated that she has an upcoming appointment and she is over the issues that she called about

## 2022-03-02 NOTE — Telephone Encounter (Signed)
[employer]ovoNordisk PAP for Ozempic re-enrollment has been completed and faxed back.

## 2022-03-03 NOTE — Telephone Encounter (Signed)
Enrollment notification received for Humalog and Basaglar stating that pt is enrolled for the 2024 calendar year.  Determinations have been sent to scan.

## 2022-03-07 DIAGNOSIS — H401113 Primary open-angle glaucoma, right eye, severe stage: Secondary | ICD-10-CM | POA: Diagnosis not present

## 2022-03-07 DIAGNOSIS — E119 Type 2 diabetes mellitus without complications: Secondary | ICD-10-CM | POA: Diagnosis not present

## 2022-03-13 ENCOUNTER — Encounter: Payer: Self-pay | Admitting: Surgery

## 2022-03-13 ENCOUNTER — Ambulatory Visit (HOSPITAL_COMMUNITY)
Admission: RE | Admit: 2022-03-13 | Discharge: 2022-03-13 | Disposition: A | Discharge status: Home / Self Care | Payer: PPO | Source: Ambulatory Visit | Attending: Surgery | Admitting: Surgery

## 2022-03-13 ENCOUNTER — Ambulatory Visit: Payer: PPO | Admitting: Surgery

## 2022-03-13 VITALS — BP 189/79 | HR 62 | Temp 98.2°F | Resp 20 | Ht 64.5 in | Wt 165.0 lb

## 2022-03-13 DIAGNOSIS — I70213 Atherosclerosis of native arteries of extremities with intermittent claudication, bilateral legs: Secondary | ICD-10-CM

## 2022-03-13 DIAGNOSIS — I7025 Atherosclerosis of native arteries of other extremities with ulceration: Secondary | ICD-10-CM | POA: Diagnosis not present

## 2022-03-13 NOTE — Progress Notes (Signed)
[facility]  Patient name: Theresa Harrington MRN: [id number] DOB: June 14, 1955 Sex: female   REASON FOR VISIT:    Follow up  [place]:    Theresa Harrington is a 66 y.o. female who is back for follow-up.  She initially underwent a left femoral-popliteal bypass graft by Dr. Donnetta Hutching on 06/08/2014.  Unfortunately, this occluded early.  I took her back to the operating room on 06/09/2014 and we did her bypass graft with Gore-Tex.  I also performed a endarterectomy of the left anterior tibial artery.  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  Her bypass graft is known to be occluded.    She developed a sore on the bottom of her right great toe which look like a popped blister.  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  She felt that her sore was getting better and so we elected to monitor this with plans for bypass if it deteriorated.  Fortunately, her ulcer has healed.  She is trying to increase her activity level.  She states that about 3 times a week she will get calf cramping at work but this is tolerable.  She has a blood blister on her left great toe which is slowly healing.  She has no other wounds.   She continues to struggle with control of her diabetes.   She remains on statin therapy.  She is anticoagulated for atrial fibrillation     PAST MEDICAL HISTORY:   Past Medical History:  Diagnosis Date   Allergy    Anxiety    Pt. denies   Arthritis    right index figer   Asymptomatic cholelithiasis    Atherosclerosis of aorta (HCC)    Cataract    Clotting disorder ([place])    Gallstones 06/2013   GERD (gastroesophageal reflux disease)    Glaucoma    "A LITTLE BIT"   History of kidney stones    lithotrispy   Hyperlipidemia    Hypertension    Hypothyroidism    no meds   Iron deficiency anemia    PAD  (peripheral artery disease) (HCC)    PAF (paroxysmal atrial fibrillation) (HCC)    Peripheral arterial occlusive disease (HCC)    lower extremities   Pneumonia    PONV (postoperative nausea and vomiting)    Right ureteral stone    Type 2 diabetes mellitus ([place])    Type   Vitamin B 12 deficiency    Wears glasses      FAMILY HISTORY:   Family History  Problem Relation Age of Onset   Kidney disease Mother    Hyperlipidemia Mother    Other Mother        AAA   and    Amputation   Diabetes Father    Heart disease Father    Deep vein thrombosis Father    Hyperlipidemia Father    Diabetes Sister    Heart disease Sister    Deep vein thrombosis Sister    Hyperlipidemia Sister    Diabetes Brother    Heart disease Brother    Cancer Brother    Hyperlipidemia Brother    Diabetes Brother    Diabetes Brother    Colon polyps Brother    Colon cancer Maternal Aunt    Arthritis Other    Cancer Other        colon  Hypertension Other    Stroke Other    Esophageal cancer Neg Hx    Stomach cancer Neg Hx    Inflammatory bowel disease Neg Hx    Liver disease Neg Hx    Pancreatic cancer Neg Hx    Crohn's disease Neg Hx    Rectal cancer Neg Hx     SOCIAL HISTORY:   Social History   Tobacco Use   Smoking status: Former    Packs/day: 1.00    Years: 28.00    Total pack years: 28.00    Types: Cigarettes    Quit date: 09/05/2001    Years since quitting: 20.5    Passive exposure: Never   Smokeless tobacco: Never  Substance Use Topics   Alcohol use: No    Alcohol/week: 0.0 standard drinks of alcohol     ALLERGIES:   Allergies  Allergen Reactions   Amlodipine Swelling   Atorvastatin Other (See Comments)    Muscle aches    Ampicillin Nausea And Vomiting and Other (See Comments)   Codeine Nausea And Vomiting    Ask patient   Lisinopril Cough        Penicillins Nausea And Vomiting    Did it involve swelling of the face/tongue/throat, SOB, or low BP? No Did it involve  sudden or severe rash/hives, skin peeling, or any reaction on the inside of your mouth or nose? No Did you need to seek medical attention at a hospital or doctor's office? No When did it last happen?      20+ years If all above answers are "NO", may proceed with cephalosporin use.    Januvia [Sitagliptin] Nausea And Vomiting     CURRENT MEDICATIONS:   Current Outpatient Medications  Medication Sig Dispense Refill   Blood Glucose Monitoring Suppl (ONETOUCH VERIO IQ SYSTEM) w/Device KIT USE TO CHECK SUGAR DAILY 1 kit 0   cetirizine (ZYRTEC) 10 MG tablet Take 10 mg by mouth daily as needed for allergies.     diltiazem (CARDIZEM) 30 MG tablet Take 1 Tablet Every 4 Hours As Needed For HR >100 And TOP BP >100 45 tablet 1   dorzolamide-timolol (COSOPT) 22.3-6.8 MG/ML ophthalmic solution Place 1 drop into the right eye 2 (two) times daily.      ezetimibe (ZETIA) 10 MG tablet TAKE ONE TABLET BY MOUTH EVERYDAY AT BEDTIME 90 tablet 1   Glucagon (GVOKE HYPOPEN 2-PACK) 1 MG/0.2ML SOAJ Inject 1 Act into the skin daily as needed. 2 mL 5   Insulin Glargine (BASAGLAR KWIKPEN) 100 UNIT/ML Inject 50 Units into the skin at bedtime. [place] patient assistance (Patient taking differently: Inject 40-42 Units into the skin at bedtime. [place] patient assistance taking 46 units) 30 mL 2   insulin lispro (HUMALOG) 100 UNIT/ML injection Inject 10-14 Units into the skin 3 (three) times daily before meals. [place] patient assistance Sliding scale     Insulin Pen Needle (PEN NEEDLES) 32G X 4 MM MISC Inject 1 pen  as directed 4 (four) times daily. Use to inject levemir or novolog as directed. 400 each 3   latanoprost (XALATAN) 0.005 % ophthalmic solution Place 1 drop into both eyes at bedtime.      losartan (COZAAR) 100 MG tablet TAKE ONE TABLET BY MOUTH EVERYDAY AT BEDTIME (Patient taking differently: daily.) 90 tablet 1   magnesium oxide (MAG-OX) 400 (240 Mg) MG tablet TAKE ONE TABLET BY MOUTH  EVERYDAY AT BEDTIME 90 tablet 1   nebivolol (BYSTOLIC) 10 MG  tablet Take 1 tablet (10 mg total) by mouth daily. 90 tablet 3   omeprazole (PRILOSEC) 40 MG capsule TAKE ONE CAPSULE BY MOUTH EVERY MORNING and TAKE ONE CAPSULE BY MOUTH EVERY EVENING 180 capsule 1   ONETOUCH VERIO test strip Use to check blood sugar twice daily. 200 strip 3   pravastatin (PRAVACHOL) 40 MG tablet TAKE ONE TABLET BY MOUTH EVERYDAY AT BEDTIME 90 tablet 1   Semaglutide, 1 MG/DOSE, (OZEMPIC, 1 MG/DOSE,) 4 MG/3ML SOPN Inject 1 mg into the skin once a week. [employer] patient assistance 9 mL 1   SYNJARDY XR 12.08-998 MG TB24 Take 1 tablet by mouth once daily 90 tablet 0   XARELTO 20 MG TABS tablet Take 1 tablet by mouth once daily 90 tablet 0   No current facility-administered medications for this visit.    REVIEW OF SYSTEMS:   _0  denotes positive finding, _1  denotes negative finding Cardiac  Comments:  Chest pain or chest pressure:    Shortness of breath upon exertion:    Short of breath when lying flat:    Irregular heart rhythm:        Vascular    Pain in calf, thigh, or hip brought on by ambulation: x   Pain in feet at night that wakes you up from your sleep:     Blood clot in your veins:    Leg swelling:         Pulmonary    Oxygen at home:    Productive cough:     Wheezing:         Neurologic    Sudden weakness in arms or legs:     Sudden numbness in arms or legs:     Sudden onset of difficulty speaking or slurred speech:    Temporary loss of vision in one eye:     Problems with dizziness:         Gastrointestinal    Blood in stool:     Vomited blood:         Genitourinary    Burning when urinating:     Blood in urine:        Psychiatric    Major depression:         Hematologic    Bleeding problems:    Problems with blood clotting too easily:        Skin    Rashes or ulcers:        Constitutional    Fever or chills:      PHYSICAL EXAM:   Vitals:   03/13/22 0920  BP:  (!) 189/79  Pulse: 62  Resp: 20  Temp: 98.2 F (36.8 C)  SpO2: 97%  Weight: 165 lb (74.8 kg)  Height: 5' 4.5" (1.638 m)    GENERAL: The patient is a well-nourished female, in no acute distress. The vital signs are documented above. CARDIAC: There is a regular rate and rhythm.  VASCULAR: Nonpalpable pedal pulses PULMONARY: Non-labored respirations MUSCULOSKELETAL: There are no major deformities or cyanosis. NEUROLOGIC: No focal weakness or paresthesias are detected. SKIN: There are no ulcers or rashes noted. PSYCHIATRIC: The patient has a normal affect.  STUDIES:   I have reviewed the following: +-------+-----------+-----------+------------+------------+  ABI/TBIToday's ABIToday's TBIPrevious ABIPrevious TBI  +-------+-----------+-----------+------------+------------+  Right 0.63       0.36       0.63        0.20          +-------+-----------+-----------+------------+------------+  Left  0.83       0.37       0.73        0.36          +-------+-----------+-----------+------------+------------+    MEDICAL ISSUES:   Claudication: The patient's remained stable.  She will return in 1 year for follow-up ABIs.  She knows to contact me sooner should she develop any issues.    Leia Alf, MD, FACS [facility] [phone] Pager [phone]

## 2022-03-14 ENCOUNTER — Ambulatory Visit (HOSPITAL_COMMUNITY): Payer: PPO | Admitting: Nurse Practitioner

## 2022-03-15 ENCOUNTER — Ambulatory Visit: Payer: PPO | Admitting: Internal Medicine

## 2022-03-17 ENCOUNTER — Other Ambulatory Visit: Payer: Self-pay | Admitting: Internal Medicine

## 2022-03-21 ENCOUNTER — Telehealth: Payer: Self-pay | Admitting: Internal Medicine

## 2022-03-21 ENCOUNTER — Encounter: Payer: Self-pay | Admitting: Internal Medicine

## 2022-03-21 NOTE — Telephone Encounter (Signed)
Pt called to report BP readings consistently for 2 weeks 170's. Today's BP this morning was 174 per patient. Pt asking about increasing losartin to 1 and a half tablets a day. Scheduled for tomorrow with Dr. Quay Burow.  Transferred pt to [facility] Triage.

## 2022-03-21 NOTE — Progress Notes (Unsigned)
Subjective:    Patient ID: Theresa Harrington, female    DOB: 08-Feb-1956, 66 y.o.   MRN: [id number]      HPI Theresa Harrington is here for No chief complaint on file.    Elevated BP -     Medications and allergies reviewed with patient and updated if appropriate.  Current Outpatient Medications on File Prior to Visit  Medication Sig Dispense Refill   Blood Glucose Monitoring Suppl (ONETOUCH VERIO IQ SYSTEM) w/Device KIT USE TO CHECK SUGAR DAILY 1 kit 0   cetirizine (ZYRTEC) 10 MG tablet Take 10 mg by mouth daily as needed for allergies.     diltiazem (CARDIZEM) 30 MG tablet Take 1 Tablet Every 4 Hours As Needed For HR >100 And TOP BP >100 45 tablet 1   dorzolamide-timolol (COSOPT) 22.3-6.8 MG/ML ophthalmic solution Place 1 drop into the right eye 2 (two) times daily.      ezetimibe (ZETIA) 10 MG tablet TAKE ONE TABLET BY MOUTH EVERYDAY AT BEDTIME 90 tablet 1   Glucagon (GVOKE HYPOPEN 2-PACK) 1 MG/0.2ML SOAJ Inject 1 Act into the skin daily as needed. 2 mL 5   HUMALOG 100 UNIT/ML cartridge DIAL AND INJECT 14-20 UNITS UNDER THE SKIN 3 (THREE) TIMES DAILY BEFORE MEALS AS DIRECTED. 75 mL 0   Insulin Glargine (BASAGLAR KWIKPEN) 100 UNIT/ML Inject 50 Units into the skin at bedtime. [place] patient assistance (Patient taking differently: Inject 40-42 Units into the skin at bedtime. [place] patient assistance taking 46 units) 30 mL 2   insulin lispro (HUMALOG) 100 UNIT/ML injection Inject 10-14 Units into the skin 3 (three) times daily before meals. [place] patient assistance Sliding scale     Insulin Pen Needle (PEN NEEDLES) 32G X 4 MM MISC Inject 1 pen  as directed 4 (four) times daily. Use to inject levemir or novolog as directed. 400 each 3   latanoprost (XALATAN) 0.005 % ophthalmic solution Place 1 drop into both eyes at bedtime.      losartan (COZAAR) 100 MG tablet TAKE ONE TABLET BY MOUTH EVERYDAY AT BEDTIME (Patient taking differently: daily.) 90 tablet 1   magnesium oxide  (MAG-OX) 400 (240 Mg) MG tablet TAKE ONE TABLET BY MOUTH EVERYDAY AT BEDTIME 90 tablet 1   nebivolol (BYSTOLIC) 10 MG tablet Take 1 tablet (10 mg total) by mouth daily. 90 tablet 3   omeprazole (PRILOSEC) 40 MG capsule TAKE ONE CAPSULE BY MOUTH EVERY MORNING and TAKE ONE CAPSULE BY MOUTH EVERY EVENING 180 capsule 1   ONETOUCH VERIO test strip Use to check blood sugar twice daily. 200 strip 3   pravastatin (PRAVACHOL) 40 MG tablet TAKE ONE TABLET BY MOUTH EVERYDAY AT BEDTIME 90 tablet 1   Semaglutide, 1 MG/DOSE, (OZEMPIC, 1 MG/DOSE,) 4 MG/3ML SOPN Inject 1 mg into the skin once a week. [employer] patient assistance 9 mL 1   SYNJARDY XR 12.08-998 MG TB24 Take 1 tablet by mouth once daily 90 tablet 0   XARELTO 20 MG TABS tablet Take 1 tablet by mouth once daily 90 tablet 0   No current facility-administered medications on file prior to visit.    Review of Systems     Objective:  There were no vitals filed for this visit. BP Readings from Last 3 Encounters:  03/13/22 (!) 189/79  12/14/21 (!) 162/96  12/07/21 118/60   Wt Readings from Last 3 Encounters:  03/13/22 165 lb (74.8 kg)  12/14/21 171 lb 6.4 oz (77.7 kg)  12/07/21 170 lb 9.6 oz (77.4 kg)   There is no height or weight on file to calculate BMI.    Physical Exam         Assessment & Plan:    See Problem List for Assessment and Plan of chronic medical problems.

## 2022-03-21 NOTE — Telephone Encounter (Signed)
Pt disconnected the call before transfer to triage could be completed.

## 2022-03-22 ENCOUNTER — Ambulatory Visit (INDEPENDENT_AMBULATORY_CARE_PROVIDER_SITE_OTHER): Payer: PPO | Admitting: Internal Medicine

## 2022-03-22 VITALS — BP 140/78 | HR 55 | Temp 97.9°F | Ht 64.5 in | Wt 167.0 lb

## 2022-03-22 DIAGNOSIS — I10 Essential (primary) hypertension: Secondary | ICD-10-CM | POA: Diagnosis not present

## 2022-03-22 DIAGNOSIS — Z89422 Acquired absence of other left toe(s): Secondary | ICD-10-CM | POA: Insufficient documentation

## 2022-03-22 MED ORDER — HYDROCHLOROTHIAZIDE 25 MG PO TABS: 25.0000 mg | ORAL_TABLET | Freq: Every day | ORAL | 5 refills | Status: DC

## 2022-03-22 NOTE — Patient Instructions (Addendum)
       Medications changes include :   hydrochlorothiazide 25 mg daily     Return for follow up as scheduled.

## 2022-03-30 ENCOUNTER — Ambulatory Visit: Payer: PPO | Admitting: Internal Medicine

## 2022-04-04 ENCOUNTER — Encounter: Payer: Self-pay | Admitting: Internal Medicine

## 2022-04-04 ENCOUNTER — Ambulatory Visit (INDEPENDENT_AMBULATORY_CARE_PROVIDER_SITE_OTHER): Payer: PPO | Admitting: Internal Medicine

## 2022-04-04 VITALS — BP 178/86 | HR 60 | Temp 97.6°F | Ht 64.5 in | Wt 168.0 lb

## 2022-04-04 DIAGNOSIS — E119 Type 2 diabetes mellitus without complications: Secondary | ICD-10-CM

## 2022-04-04 DIAGNOSIS — E1159 Type 2 diabetes mellitus with other circulatory complications: Secondary | ICD-10-CM

## 2022-04-04 DIAGNOSIS — Z0000 Encounter for general adult medical examination without abnormal findings: Secondary | ICD-10-CM | POA: Diagnosis not present

## 2022-04-04 DIAGNOSIS — S98132A Complete traumatic amputation of one left lesser toe, initial encounter: Secondary | ICD-10-CM | POA: Diagnosis not present

## 2022-04-04 DIAGNOSIS — Z0001 Encounter for general adult medical examination with abnormal findings: Secondary | ICD-10-CM

## 2022-04-04 DIAGNOSIS — E1151 Type 2 diabetes mellitus with diabetic peripheral angiopathy without gangrene: Secondary | ICD-10-CM

## 2022-04-04 DIAGNOSIS — E538 Deficiency of other specified B group vitamins: Secondary | ICD-10-CM | POA: Diagnosis not present

## 2022-04-04 DIAGNOSIS — Z794 Long term (current) use of insulin: Secondary | ICD-10-CM | POA: Diagnosis not present

## 2022-04-04 DIAGNOSIS — G63 Polyneuropathy in diseases classified elsewhere: Secondary | ICD-10-CM | POA: Diagnosis not present

## 2022-04-04 DIAGNOSIS — I7025 Atherosclerosis of native arteries of other extremities with ulceration: Secondary | ICD-10-CM

## 2022-04-04 DIAGNOSIS — I10 Essential (primary) hypertension: Secondary | ICD-10-CM | POA: Diagnosis not present

## 2022-04-04 DIAGNOSIS — E785 Hyperlipidemia, unspecified: Secondary | ICD-10-CM

## 2022-04-04 LAB — BASIC METABOLIC PANEL
BUN: 11 mg/dL (ref 6–23)
CO2: 28 mEq/L (ref 19–32)
Calcium: 9.2 mg/dL (ref 8.4–10.5)
Chloride: 104 mEq/L (ref 96–112)
Creatinine, Ser: 0.7 mg/dL (ref 0.40–1.20)
GFR: 90.95 mL/min (ref >60.00)
Glucose, Bld: 123 mg/dL — ABNORMAL HIGH (ref 70–99)
Potassium: 4.3 mEq/L (ref 3.5–5.1)
Sodium: 139 mEq/L (ref 135–145)

## 2022-04-04 LAB — URINALYSIS, ROUTINE W REFLEX MICROSCOPIC
Bilirubin Urine: NEGATIVE
Hgb urine dipstick: NEGATIVE
Ketones, ur: NEGATIVE
Leukocytes,Ua: NEGATIVE
Nitrite: NEGATIVE
Specific Gravity, Urine: 1.02 (ref 1.000–1.030)
Total Protein, Urine: NEGATIVE
Urine Glucose: >=1000 — AB
Urobilinogen, UA: 0.2 (ref 0.0–1.0)
pH: 7 (ref 5.0–8.0)

## 2022-04-04 LAB — HEPATIC FUNCTION PANEL
ALT: 11 U/L (ref 0–35)
AST: 13 U/L (ref 0–37)
Albumin: 4 g/dL (ref 3.5–5.2)
Alkaline Phosphatase: 75 U/L (ref 39–117)
Bilirubin, Direct: 0 mg/dL (ref 0.0–0.3)
Total Bilirubin: 0.4 mg/dL (ref 0.2–1.2)
Total Protein: 6.8 g/dL (ref 6.0–8.3)

## 2022-04-04 LAB — MICROALBUMIN / CREATININE URINE RATIO
Creatinine,U: 86.7 mg/dL
Microalb Creat Ratio: 1.8 mg/g (ref 0.0–30.0)
Microalb, Ur: 1.5 mg/dL (ref 0.0–1.9)

## 2022-04-04 LAB — LIPID PANEL
Cholesterol: 223 mg/dL — ABNORMAL HIGH (ref 0–200)
HDL: 53.4 mg/dL (ref >39.00)
LDL Cholesterol: 131 mg/dL — ABNORMAL HIGH (ref 0–99)
NonHDL: 169.19
Total CHOL/HDL Ratio: 4
Triglycerides: 192 mg/dL — ABNORMAL HIGH (ref 0.0–149.0)
VLDL: 38.4 mg/dL (ref 0.0–40.0)

## 2022-04-04 LAB — FOLATE: Folate: 11.4 ng/mL (ref >5.9)

## 2022-04-04 LAB — HEMOGLOBIN A1C: Hgb A1c MFr Bld: 8.5 % — ABNORMAL HIGH (ref 4.6–6.5)

## 2022-04-04 MED ORDER — HYDRALAZINE HCL 50 MG PO TABS: 50.0000 mg | ORAL_TABLET | Freq: Three times a day (TID) | ORAL | 0 refills | Status: DC

## 2022-04-04 NOTE — Patient Instructions (Signed)
Hypertension, Adult High blood pressure (hypertension) is when the force of blood pumping through the arteries is too strong. The arteries are the blood vessels that carry blood from the heart throughout the body. Hypertension forces the heart to work harder to pump blood and may cause arteries to become narrow or stiff. Untreated or uncontrolled hypertension can lead to a heart attack, heart failure, a stroke, kidney disease, and other problems. A blood pressure reading consists of a higher number over a lower number. Ideally, your blood pressure should be below 120/80. The first ("top") number is called the systolic pressure. It is a measure of the pressure in your arteries as your heart beats. The second ("bottom") number is called the diastolic pressure. It is a measure of the pressure in your arteries as the heart relaxes. What are the causes? The exact cause of this condition is not known. There are some conditions that result in high blood pressure. What increases the risk? Certain factors may make you more likely to develop high blood pressure. Some of these risk factors are under your control, including: Smoking. Not getting enough exercise or physical activity. Being overweight. Having too much fat, sugar, calories, or salt (sodium) in your diet. Drinking too much alcohol. Other risk factors include: Having a personal history of heart disease, diabetes, high cholesterol, or kidney disease. Stress. Having a family history of high blood pressure and high cholesterol. Having obstructive sleep apnea. Age. The risk increases with age. What are the signs or symptoms? High blood pressure may not cause symptoms. Very high blood pressure (hypertensive crisis) may cause: Headache. Fast or irregular heartbeats (palpitations). Shortness of breath. Nosebleed. Nausea and vomiting. Vision changes. Severe chest pain, dizziness, and seizures. How is this diagnosed? This condition is diagnosed by  measuring your blood pressure while you are seated, with your arm resting on a flat surface, your legs uncrossed, and your feet flat on the floor. The cuff of the blood pressure monitor will be placed directly against the skin of your upper arm at the level of your heart. Blood pressure should be measured at least twice using the same arm. Certain conditions can cause a difference in blood pressure between your right and left arms. If you have a high blood pressure reading during one visit or you have normal blood pressure with other risk factors, you may be asked to: Return on a different day to have your blood pressure checked again. Monitor your blood pressure at home for 1 week or longer. If you are diagnosed with hypertension, you may have other blood or imaging tests to help your health care provider understand your overall risk for other conditions. How is this treated? This condition is treated by making healthy lifestyle changes, such as eating healthy foods, exercising more, and reducing your alcohol intake. You may be referred for counseling on a healthy diet and physical activity. Your health care provider may prescribe medicine if lifestyle changes are not enough to get your blood pressure under control and if: Your systolic blood pressure is above 130. Your diastolic blood pressure is above 80. Your personal target blood pressure may vary depending on your medical conditions, your age, and other factors. Follow these instructions at home: Eating and drinking  Eat a diet that is high in fiber and potassium, and low in sodium, added sugar, and fat. An example of this eating plan is called the DASH diet. DASH stands for Dietary Approaches to Stop Hypertension. To eat this way: Eat   plenty of fresh fruits and vegetables. Try to fill one half of your plate at each meal with fruits and vegetables. Eat whole grains, such as whole-wheat pasta, brown rice, or whole-grain bread. Fill about one  fourth of your plate with whole grains. Eat or drink low-fat dairy products, such as skim milk or low-fat yogurt. Avoid fatty cuts of meat, processed or cured meats, and poultry with skin. Fill about one fourth of your plate with lean proteins, such as fish, chicken without skin, beans, eggs, or tofu. Avoid pre-made and processed foods. These tend to be higher in sodium, added sugar, and fat. Reduce your daily sodium intake. Many people with hypertension should eat less than 1,500 mg of sodium a day. Do not drink alcohol if: Your health care provider tells you not to drink. You are pregnant, may be pregnant, or are planning to become pregnant. If you drink alcohol: Limit how much you have to: 0-1 drink a day for women. 0-2 drinks a day for men. Know how much alcohol is in your drink. In the [place] one drink equals one 12 oz bottle of beer (355 mL), one 5 oz glass of wine (148 mL), or one 1 oz glass of hard liquor (44 mL). Lifestyle  Work with your health care provider to maintain a healthy body weight or to lose weight. Ask what an ideal weight is for you. Get at least 30 minutes of exercise that causes your heart to beat faster (aerobic exercise) most days of the week. Activities may include walking, swimming, or biking. Include exercise to strengthen your muscles (resistance exercise), such as Pilates or lifting weights, as part of your weekly exercise routine. Try to do these types of exercises for 30 minutes at least 3 days a week. Do not use any products that contain nicotine or tobacco. These products include cigarettes, chewing tobacco, and vaping devices, such as e-cigarettes. If you need help quitting, ask your health care provider. Monitor your blood pressure at home as told by your health care provider. Keep all follow-up visits. This is important. Medicines Take over-the-counter and prescription medicines only as told by your health care provider. Follow directions carefully. Blood  pressure medicines must be taken as prescribed. Do not skip doses of blood pressure medicine. Doing this puts you at risk for problems and can make the medicine less effective. Ask your health care provider about side effects or reactions to medicines that you should watch for. Contact a health care provider if you: Think you are having a reaction to a medicine you are taking. Have headaches that keep coming back (recurring). Feel dizzy. Have swelling in your ankles. Have trouble with your vision. Get help right away if you: Develop a severe headache or confusion. Have unusual weakness or numbness. Feel faint. Have severe pain in your chest or abdomen. Vomit repeatedly. Have trouble breathing. These symptoms may be an emergency. Get help right away. Call 911. Do not wait to see if the symptoms will go away. Do not drive yourself to the hospital. Summary Hypertension is when the force of blood pumping through your arteries is too strong. If this condition is not controlled, it may put you at risk for serious complications. Your personal target blood pressure may vary depending on your medical conditions, your age, and other factors. For most people, a normal blood pressure is less than 120/80. Hypertension is treated with lifestyle changes, medicines, or a combination of both. Lifestyle changes include losing weight, eating a healthy,   low-sodium diet, exercising more, and limiting alcohol. This information is not intended to replace advice given to you by your health care provider. Make sure you discuss any questions you have with your health care provider. Document Revised: 02/22/2021 Document Reviewed: 02/22/2021 Elsevier Patient Education  2023 [employer]  

## 2022-04-04 NOTE — Progress Notes (Signed)
Subjective:  Patient ID: Theresa Harrington, female    DOB: 07-15-1955  Age: 66 y.o. MRN: [id number]  CC: Annual Exam, Hypertension, Atrial Fibrillation, Hyperlipidemia, and Diabetes   HPI Theresa Harrington presents for a CPX and f/up -  She complains that her blood pressure is not adequately well-controlled.  She denies headache, blurred vision, chest pain, or shortness of breath.  Outpatient Medications Prior to Visit  Medication Sig Dispense Refill   Blood Glucose Monitoring Suppl (ONETOUCH VERIO IQ SYSTEM) w/Device KIT USE TO CHECK SUGAR DAILY 1 kit 0   cetirizine (ZYRTEC) 10 MG tablet Take 10 mg by mouth daily as needed for allergies.     diltiazem (CARDIZEM) 30 MG tablet Take 1 Tablet Every 4 Hours As Needed For HR >100 And TOP BP >100 45 tablet 1   dorzolamide-timolol (COSOPT) 22.3-6.8 MG/ML ophthalmic solution Place 1 drop into the right eye 2 (two) times daily.      ezetimibe (ZETIA) 10 MG tablet TAKE ONE TABLET BY MOUTH EVERYDAY AT BEDTIME 90 tablet 1   Glucagon (GVOKE HYPOPEN 2-PACK) 1 MG/0.2ML SOAJ Inject 1 Act into the skin daily as needed. 2 mL 5   HUMALOG 100 UNIT/ML cartridge DIAL AND INJECT 14-20 UNITS UNDER THE SKIN 3 (THREE) TIMES DAILY BEFORE MEALS AS DIRECTED. 75 mL 0   Insulin Glargine (BASAGLAR KWIKPEN) 100 UNIT/ML Inject 50 Units into the skin at bedtime. [place] patient assistance (Patient taking differently: Inject 40-42 Units into the skin at bedtime. [place] patient assistance taking 46 units) 30 mL 2   insulin lispro (HUMALOG) 100 UNIT/ML injection Inject 10-14 Units into the skin 3 (three) times daily before meals. [place] patient assistance Sliding scale     Insulin Pen Needle (PEN NEEDLES) 32G X 4 MM MISC Inject 1 pen  as directed 4 (four) times daily. Use to inject levemir or novolog as directed. 400 each 3   latanoprost (XALATAN) 0.005 % ophthalmic solution Place 1 drop into both eyes at bedtime.      losartan (COZAAR) 100 MG tablet TAKE ONE  TABLET BY MOUTH EVERYDAY AT BEDTIME 90 tablet 1   magnesium oxide (MAG-OX) 400 (240 Mg) MG tablet TAKE ONE TABLET BY MOUTH EVERYDAY AT BEDTIME 90 tablet 1   nebivolol (BYSTOLIC) 10 MG tablet Take 1 tablet (10 mg total) by mouth daily. 90 tablet 3   omeprazole (PRILOSEC) 40 MG capsule TAKE ONE CAPSULE BY MOUTH EVERY MORNING and TAKE ONE CAPSULE BY MOUTH EVERY EVENING 180 capsule 1   ONETOUCH VERIO test strip Use to check blood sugar twice daily. 200 strip 3   pravastatin (PRAVACHOL) 40 MG tablet TAKE ONE TABLET BY MOUTH EVERYDAY AT BEDTIME 90 tablet 1   XARELTO 20 MG TABS tablet Take 1 tablet by mouth once daily 90 tablet 0   doxycycline (VIBRAMYCIN) 100 MG capsule Take 100 mg by mouth 2 (two) times daily.     hydrochlorothiazide (HYDRODIURIL) 25 MG tablet Take 1 tablet (25 mg total) by mouth daily. 30 tablet 5   HYDROcodone bit-homatropine (HYCODAN) 5-1.5 MG/5ML syrup Take 5 mLs by mouth every 4 (four) hours as needed.     predniSONE (DELTASONE) 20 MG tablet Take 40 mg by mouth daily. (Patient not taking: Reported on 04/06/2022)     predniSONE (DELTASONE) 5 MG tablet Take 5 mg by mouth daily with breakfast. (Patient not taking: Reported on 04/06/2022)     Semaglutide, 1 MG/DOSE, (OZEMPIC, 1 MG/DOSE,) 4 MG/3ML SOPN Inject 1 mg  into the skin once a week. Via [employer] patient assistance 9 mL 1   SYNJARDY XR 12.08-998 MG TB24 Take 1 tablet by mouth once daily 90 tablet 0   No facility-administered medications prior to visit.    ROS Review of Systems  Constitutional: Negative.  Negative for diaphoresis and fatigue.  HENT: Negative.    Eyes: Negative.   Respiratory:  Negative for cough, chest tightness, shortness of breath and wheezing.   Cardiovascular:  Negative for chest pain, palpitations and leg swelling.  Gastrointestinal:  Negative for abdominal pain, diarrhea, nausea and vomiting.  Endocrine: Negative.   Genitourinary: Negative.  Negative for difficulty urinating, dysuria and hematuria.   Musculoskeletal: Negative.  Negative for arthralgias, myalgias and neck pain.  Skin: Negative.   Allergic/Immunologic: Negative.   Neurological: Negative.  Negative for dizziness, weakness and numbness.  Hematological:  Negative for adenopathy. Does not bruise/bleed easily.  Psychiatric/Behavioral: Negative.      Objective:  BP (!) 178/86 (BP Location: Right Arm, Patient Position: Sitting, Cuff Size: Large)   Pulse 60   Temp 97.6 F (36.4 C) (Oral)   Ht 5' 4.5" (1.638 m)   Wt 168 lb (76.2 kg)   LMP  (LMP Unknown)   SpO2 99%   BMI 28.39 kg/m   BP Readings from Last 3 Encounters:  04/06/22 (!) 150/60  04/04/22 (!) 178/86  03/22/22 (!) 140/78    Wt Readings from Last 3 Encounters:  04/06/22 168 lb (76.2 kg)  04/04/22 168 lb (76.2 kg)  03/22/22 167 lb (75.8 kg)    Physical Exam Vitals reviewed.  Constitutional:      Appearance: Normal appearance.  HENT:     Nose: Nose normal.     Mouth/Throat:     Mouth: Mucous membranes are moist.  Eyes:     General: No scleral icterus.    Conjunctiva/sclera: Conjunctivae normal.  Cardiovascular:     Rate and Rhythm: Normal rate and regular rhythm.     Heart sounds: No murmur heard. Pulmonary:     Effort: Pulmonary effort is normal.     Breath sounds: No stridor. No wheezing, rhonchi or rales.  Abdominal:     General: Abdomen is flat.     Palpations: There is no mass.     Tenderness: There is no abdominal tenderness. There is no guarding.     Hernia: No hernia is present.  Musculoskeletal:        General: Normal range of motion.     Cervical back: Neck supple.     Right lower leg: No edema.     Left lower leg: No edema.  Lymphadenopathy:     Cervical: No cervical adenopathy.  Skin:    General: Skin is warm and dry.  Neurological:     General: No focal deficit present.     Mental Status: She is alert. Mental status is at baseline.  Psychiatric:        Mood and Affect: Mood normal.        Behavior: Behavior normal.      Lab Results  Component Value Date   WBC 7.8 12/14/2021   HGB 13.9 12/14/2021   HCT 43.3 12/14/2021   PLT 304 12/14/2021   GLUCOSE 123 (H) 04/04/2022   CHOL 223 (H) 04/04/2022   TRIG 192.0 (H) 04/04/2022   HDL 53.40 04/04/2022   LDLDIRECT 160.2 03/01/2010   LDLCALC 131 (H) 04/04/2022   ALT 11 04/04/2022   AST 13 04/04/2022   NA 139 04/04/2022  K 4.3 04/04/2022   CL 104 04/04/2022   CREATININE 0.70 04/04/2022   BUN 11 04/04/2022   CO2 28 04/04/2022   TSH 0.51 06/23/2021   INR 1.3 (H) 03/08/2020   HGBA1C 8.5 (H) 04/04/2022   MICROALBUR 1.5 04/04/2022    VAS [place] ABI WITH/WO TBI  Result Date: 03/13/2022  LOWER EXTREMITY DOPPLER STUDY Patient Name:  KARIANNA GUSMAN  Date of Exam:   03/13/2022 Medical Rec #: [id number]   Accession #:    [id number] Date of Birth: 1955/11/14  Patient Gender: F Patient Age:   26 years Exam Location:  [facility] Procedure:      VAS [place] ABI WITH/WO TBI Referring Phys: Harold Barban --------------------------------------------------------------------------------  Indications: Peripheral artery disease. High Risk Factors: Hypertension, hyperlipidemia..Atrial Fibrillation, diabetic Other Factors:  Vascular Interventions: Hx occluded left Fem Pop bypass graft.                         Left ATA endarterectomy. Left pop thrombectomy. Left                         CFA, EIA angioplasty. 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                          Right EIA 50-99% stenosis. Performing Technologist: Alvia Grove RVT  Examination Guidelines: A complete evaluation includes at minimum, Doppler waveform signals and systolic blood pressure reading at the level of bilateral brachial, anterior tibial, and posterior tibial arteries, when vessel segments are accessible. Bilateral testing is considered an integral part of a complete examination. Photoelectric Plethysmograph (PPG) waveforms and toe systolic pressure readings are included as required and additional duplex  testing as needed. Limited examinations for reoccurring indications may be performed as noted.  ABI Findings: +---------+------------------+-----+----------+--------+ Right    Rt Pressure (mmHg)IndexWaveform  Comment  +---------+------------------+-----+----------+--------+ Brachial 213                                       +---------+------------------+-----+----------+--------+ PTA      135               0.63 monophasic         +---------+------------------+-----+----------+--------+ DP       97                0.45 monophasic         +---------+------------------+-----+----------+--------+ Great Toe76                0.36 Abnormal           +---------+------------------+-----+----------+--------+ +---------+------------------+-----+----------+-------+ Left     Lt Pressure (mmHg)IndexWaveform  Comment +---------+------------------+-----+----------+-------+ Brachial 214                                      +---------+------------------+-----+----------+-------+ PTA      177               0.83 monophasic        +---------+------------------+-----+----------+-------+ DP       162               0.76 monophasic        +---------+------------------+-----+----------+-------+ Deirdre Evener  0.37 Abnormal          +---------+------------------+-----+----------+-------+ +-------+-----------+-----------+------------+------------+ ABI/TBIToday's ABIToday's TBIPrevious ABIPrevious TBI +-------+-----------+-----------+------------+------------+ Right  0.63       0.36       0.63        0.20         +-------+-----------+-----------+------------+------------+ Left   0.83       0.37       0.73        0.36         +-------+-----------+-----------+------------+------------+  Right ABIs appear essentially unchanged. Left ABI appears increased , may be falsely elevated.  Summary: Right: Resting right ankle-brachial index indicates moderate right  lower extremity arterial disease. The right toe-brachial index is abnormal. Left: Resting left ankle-brachial index indicates mild left lower extremity arterial disease, may be falsely elevated due to calcified arteries.. The left toe-brachial index is abnormal. *See table(s) above for measurements and observations.  Electronically signed by Harold Barban MD on 03/13/2022 at 11:43:40 AM.    Final     Assessment & Plan:   Theresa Harrington was seen today for annual exam, hypertension, atrial fibrillation, hyperlipidemia and diabetes.  Diagnoses and all orders for this visit:  DM (diabetes mellitus) type II, controlled, with peripheral vascular disorder ([place])- Her A1c is up to 8.5%.  Will increase the dose of the SGLT2 inhibitor. -     Basic metabolic panel; Future -     Hemoglobin A1c; Future -     Microalbumin / creatinine urine ratio; Future -     HM Diabetes Foot Exam -     Microalbumin / creatinine urine ratio -     Hemoglobin A1c -     Basic metabolic panel -     Semaglutide, 1 MG/DOSE, (OZEMPIC, 1 MG/DOSE,) 4 MG/3ML SOPN; Inject 1 mg into the skin once a week. Via [employer] patient assistance  Hyperlipidemia with target LDL less than 100- She has not achieved her LDL goal.  Will add bempedoic acid. -     Lipid panel; Future -     Hepatic function panel; Future -     Hepatic function panel -     Lipid panel  Essential hypertension, benign- Will try to get better control of her blood pressure. -     Discontinue: hydrALAZINE (APRESOLINE) 50 MG tablet; Take 1 tablet (50 mg total) by mouth 3 (three) times daily. -     Basic metabolic panel; Future -     Urinalysis, Routine w reflex microscopic; Future -     Hepatic function panel; Future -     Hepatic function panel -     Urinalysis, Routine w reflex microscopic -     Basic metabolic panel  Vitamin C58 deficiency neuropathy (HCC) -     Folate; Future -     Folate  Encounter for general adult medical examination with abnormal findings- Exam  completed, labs reviewed, vaccines reviewed and updated, cancer screenings are up-to-date, patient education was given.  Amputated toe of left foot ([place])- Will try to get better control of her LDL.  Type 2 diabetes mellitus with other circulatory complication, with long-term current use of insulin (HCC) -     Semaglutide, 1 MG/DOSE, (OZEMPIC, 1 MG/DOSE,) 4 MG/3ML SOPN; Inject 1 mg into the skin once a week. Via [employer] patient assistance -     Empagliflozin-metFORMIN HCl ER (SYNJARDY XR) 25-1000 MG TB24; Take 1 tablet by mouth daily.  Insulin-requiring or dependent type II diabetes mellitus ([place]) -  Continuous Blood Gluc Receiver ([place]) DEVI; 1 Act by Does not apply route daily. -     Continuous Blood Gluc Transmit (DEXCOM G6 TRANSMITTER) MISC; 1 Act by Does not apply route daily. -     Continuous Blood Gluc Sensor (DEXCOM G6 SENSOR) MISC; 1 Act by Does not apply route daily.   I have discontinued Theresa Harrington's Synjardy XR, doxycycline, and HYDROcodone bit-homatropine. I am also having her start on Dexcom G6 Receiver, Dexcom G6 Transmitter, Dexcom G6 Sensor, Synjardy XR, and Nexletol. Additionally, I am having her maintain her latanoprost, OneTouch Verio IQ System, dorzolamide-timolol, insulin lispro, cetirizine, Basaglar KwikPen, [place] HypoPen 2-Pack, OneTouch Verio, nebivolol, losartan, Pen Needles, diltiazem, pravastatin, magnesium oxide, ezetimibe, omeprazole, Xarelto, HumaLOG, and Ozempic (1 MG/DOSE).  Meds ordered this encounter  Medications   DISCONTD: hydrALAZINE (APRESOLINE) 50 MG tablet    Sig: Take 1 tablet (50 mg total) by mouth 3 (three) times daily.    Dispense:  270 tablet    Refill:  0   Semaglutide, 1 MG/DOSE, (OZEMPIC, 1 MG/DOSE,) 4 MG/3ML SOPN    Sig: Inject 1 mg into the skin once a week. Via [employer] patient assistance    Dispense:  9 mL    Refill:  1   Continuous Blood Gluc Receiver (DEXCOM G6 RECEIVER) DEVI    Sig: 1 Act by Does not apply route  daily.    Dispense:  1 each    Refill:  5   Continuous Blood Gluc Transmit (DEXCOM G6 TRANSMITTER) MISC    Sig: 1 Act by Does not apply route daily.    Dispense:  1 each    Refill:  5   Continuous Blood Gluc Sensor (DEXCOM G6 SENSOR) MISC    Sig: 1 Act by Does not apply route daily.    Dispense:  1 each    Refill:  5   Empagliflozin-metFORMIN HCl ER (SYNJARDY XR) 25-1000 MG TB24    Sig: Take 1 tablet by mouth daily.    Dispense:  90 tablet    Refill:  1   Bempedoic Acid (NEXLETOL) 180 MG TABS    Sig: Take 1 tablet by mouth daily.    Dispense:  90 tablet    Refill:  1     Follow-up: Return in about 3 months (around 07/04/2022).  Scarlette Calico, MD

## 2022-04-06 ENCOUNTER — Encounter: Payer: Self-pay | Admitting: Cardiology

## 2022-04-06 ENCOUNTER — Ambulatory Visit: Payer: PPO | Attending: Cardiology | Admitting: Cardiology

## 2022-04-06 VITALS — BP 150/60 | HR 62 | Ht 64.5 in | Wt 168.0 lb

## 2022-04-06 DIAGNOSIS — I10 Essential (primary) hypertension: Secondary | ICD-10-CM | POA: Diagnosis not present

## 2022-04-06 DIAGNOSIS — I480 Paroxysmal atrial fibrillation: Secondary | ICD-10-CM | POA: Diagnosis not present

## 2022-04-06 DIAGNOSIS — E119 Type 2 diabetes mellitus without complications: Secondary | ICD-10-CM | POA: Diagnosis not present

## 2022-04-06 DIAGNOSIS — I7025 Atherosclerosis of native arteries of other extremities with ulceration: Secondary | ICD-10-CM

## 2022-04-06 MED ORDER — DEXCOM G6 SENSOR MISC: 1.0000 | Freq: Every day | 5 refills | Status: DC

## 2022-04-06 MED ORDER — DEXCOM G6 RECEIVER DEVI: 1.0000 | Freq: Every day | 5 refills | Status: DC

## 2022-04-06 MED ORDER — OZEMPIC (1 MG/DOSE) 4 MG/3ML ~~LOC~~ SOPN: 1.0000 mg | PEN_INJECTOR | SUBCUTANEOUS | 1 refills | Status: DC

## 2022-04-06 MED ORDER — DEXCOM G6 TRANSMITTER MISC: 1.0000 | Freq: Every day | 5 refills | Status: DC

## 2022-04-06 MED ORDER — HYDROCHLOROTHIAZIDE 25 MG PO TABS: 12.5000 mg | ORAL_TABLET | Freq: Every day | ORAL | 3 refills | Status: DC

## 2022-04-06 NOTE — Telephone Encounter (Signed)
Pt came to office and picked up Ozempic

## 2022-04-06 NOTE — Progress Notes (Signed)
Cardiology Office Note:    Date:  04/06/2022   ID:  Theresa Harrington, DOB Jun 23, 1955, MRN [id number]  PCP:  [name], MD   [facility] Providers Cardiologist:  [name], MD Electrophysiologist:  Will Meredith Leeds, MD     Referring MD: [name], MD   History of Present Illness:    Theresa Harrington is a 66 y.o. female here for the follow-up of atrial fibrillation.  Dr. Curt Bears ablation on 08/02/2021.  AFlutter post ablation 117 bpm surrounding COVID and personal stress. AFIB clinic, [name].  Previously seen for the follow up of atrial fibrillation with RVR per Dr. Scarlette Calico.   Severe mitral annular calcification of mitral valve with thickening of the posterior mitral valve leaflet with edges immobile.  There is very mild stenosis.  3 mmHg gradient.  No mitral regurgitation.   Overall she is doing fairly well.  She did have another episode of atrial fibrillation with flu.  Lasted 2 to 3 days.  She also had some difficulties trying to take hydralazine 3 times a day.  Compliance.  She was worried about taking HCTZ.  See below.  Denies any chest pain fevers chills nausea vomiting.  Past Medical History:  Diagnosis Date   Allergy    Anxiety    Pt. denies   Arthritis    right index figer   Asymptomatic cholelithiasis    Atherosclerosis of aorta (HCC)    Cataract    Clotting disorder ([place])    Gallstones 06/2013   GERD (gastroesophageal reflux disease)    Glaucoma    "A LITTLE BIT"   History of kidney stones    lithotrispy   Hyperlipidemia    Hypertension    Hypothyroidism    no meds   Iron deficiency anemia    PAD (peripheral artery disease) (HCC)    PAF (paroxysmal atrial fibrillation) (HCC)    Peripheral arterial occlusive disease (HCC)    lower extremities   Pneumonia    PONV (postoperative nausea and vomiting)    Right ureteral stone    Type 2 diabetes mellitus ([place])    Type   Vitamin B 12 deficiency    Wears glasses     Past Surgical  History:  Procedure Laterality Date   ABDOMINAL AORTOGRAM W/LOWER EXTREMITY N/A 03/09/2020   Procedure: ABDOMINAL AORTOGRAM W/LOWER EXTREMITY;  Surgeon: Serafina Mitchell, MD;  Location: [place] [facility];  Service: Cardiovascular;  Laterality: N/A;   AMPUTATION Left 07/16/2014   Procedure: LEFT SECOND TOE AMPUTATION;  Surgeon: Serafina Mitchell, MD;  Location: [place];  Service: Vascular;  Laterality: Left;  With Nerve block   ATRIAL FIBRILLATION ABLATION N/A 08/02/2021   Procedure: ATRIAL FIBRILLATION ABLATION;  Surgeon: Constance Haw, MD;  Location: [place] [facility];  Service: Cardiovascular;  Laterality: N/A;   AUGMENTATION MAMMAPLASTY     BELPHAROPTOSIS REPAIR     eyelid lift   BIOPSY  10/14/2018   Procedure: BIOPSY;  Surgeon: Rush Landmark Telford Nab., MD;  Location: [place];  Service: Gastroenterology;;   BIOPSY  08/04/2019   Procedure: BIOPSY;  Surgeon: Irving Copas., MD;  Location: [place];  Service: Gastroenterology;;   CARDIOVERSION N/A 09/19/2019   Procedure: CARDIOVERSION;  Surgeon: Buford Dresser, MD;  Location: [facility];  Service: Cardiovascular;  Laterality: N/A;   COLONOSCOPY     COMBINED AUGMENTATION MAMMAPLASTY AND ABDOMINOPLASTY  2009   W/  BILATERAL  THIGH LIFT   CYSTO/  RIGHT URETERAL STENT PLACEMENT  12/30/2010  CYSTOSCOPY WITH RETROGRADE PYELOGRAM, URETEROSCOPY AND STENT PLACEMENT Right 10/07/2013   Procedure: CYSTOSCOPY WITH RETROGRADE PYELOGRAM, right URETEROSCOPY AND STENT PLACEMENT, stone extraction;  Surgeon: Arvil Persons, MD;  Location: [facility];  Service: Urology;  Laterality: Right;   CYSTOSCOPY WITH STENT PLACEMENT Right 06/04/2013   Procedure: CYSTOSCOPY WITH STENT PLACEMENT;  Surgeon: Franchot Gallo, MD;  Location: [facility];  Service: Urology;  Laterality: Right;   DILATATION & CURETTAGE/HYSTEROSCOPY WITH MYOSURE N/A 04/27/2015   Procedure: DILATATION & CURETTAGE/HYSTEROSCOPY WITH MYOSURE;  Surgeon:  Terrance Mass, MD;  Location: [place] [facility];  Service: Gynecology;  Laterality: N/A;   ENDARTERECTOMY FEMORAL Left 06/08/2014   Procedure: Left Leg Common Femoral and External Iliac  Endartarectomy with patch Angioplasty;  Surgeon: Rosetta Posner, MD;  Location: [facility] OR;  Service: Vascular;  Laterality: Left;   ESOPHAGOGASTRODUODENOSCOPY N/A 10/14/2018   Procedure: ESOPHAGOGASTRODUODENOSCOPY (EGD);  Surgeon: Irving Copas., MD;  Location: [place];  Service: Gastroenterology;  Laterality: N/A;   ESOPHAGOGASTRODUODENOSCOPY (EGD) WITH PROPOFOL N/A 11/06/2018   Procedure: ESOPHAGOGASTRODUODENOSCOPY (EGD) WITH PROPOFOL;  Surgeon: Rush Landmark Telford Nab., MD;  Location: [facility];  Service: Gastroenterology;  Laterality: N/A;  RFA   ESOPHAGOGASTRODUODENOSCOPY (EGD) WITH PROPOFOL N/A 02/12/2019   Procedure: ESOPHAGOGASTRODUODENOSCOPY (EGD) WITH PROPOFOL;  Surgeon: Rush Landmark Telford Nab., MD;  Location: [facility];  Service: Gastroenterology;  Laterality: N/A;   ESOPHAGOGASTRODUODENOSCOPY (EGD) WITH PROPOFOL N/A 08/04/2019   Procedure: ESOPHAGOGASTRODUODENOSCOPY (EGD) WITH PROPOFOL;  Surgeon: Rush Landmark Telford Nab., MD;  Location: [place];  Service: Gastroenterology;  Laterality: N/A;  WITH RFA   EXTRACORPOREAL SHOCK WAVE LITHOTRIPSY Right 08-04-2013//   06-23-2013//   01-16-2011   EYE SURGERY Bilateral    cataract   FEMORAL-POPLITEAL BYPASS GRAFT Left 06/08/2014   Procedure: Left Leg Femoral -Popliteal Bypass Graft;  Surgeon: Rosetta Posner, MD;  Location: [facility] OR;  Service: Vascular;  Laterality: Left;   FEMORAL-POPLITEAL BYPASS GRAFT Left 06/09/2014   Procedure: Left Femoral and Popliteal Exposure; Left Femoral to Anterior Tibial Bypass Graft using Propaten 49m by 80cm Goretex Graft; Left Tibial Endarterectomy; Left Femoraland Popliteal Thrombectomy ;  Surgeon: VSerafina Mitchell MD;  Location: [place]  Service: Vascular;  Laterality: Left;   GI RADIOFREQUENCY ABLATION N/A 11/06/2018    Procedure: GI RADIOFREQUENCY ABLATION;  Surgeon: MIrving Copas, MD;  [facility];  Service: Gastroenterology;  Laterality: N/A;   GI RADIOFREQUENCY ABLATION N/A 02/12/2019   Procedure: GI RADIOFREQUENCY ABLATION;  Surgeon: MRush LandmarkGTelford Nab, MD;  [facility];  Service: Gastroenterology;  Laterality: N/A;   GI RADIOFREQUENCY ABLATION N/A 08/04/2019   Procedure: GI RADIOFREQUENCY ABLATION;  Surgeon: MRush LandmarkGTelford Nab, MD;  Location: [place]  Service: Gastroenterology;  Laterality: N/A;   HOLMIUM LASER APPLICATION Right [id number]  Procedure: HOLMIUM LASER APPLICATION;  Surgeon: MArvil Persons MD;  [facility]  Service: Urology;  Laterality: Right;   KIDNEY STONE SURGERY  07/2013   1-2 stones   LAPAROSCOPIC GASTRIC BANDING  05/29/2005   LITHOTRIPSY  2-3 times   LOWER EXTREMITY ANGIOGRAM N/A 06/04/2014   Procedure: LOWER EXTREMITY ANGIOGRAM;  Surgeon: VSerafina Mitchell MD;  [facility];  Service: Cardiovascular;  Laterality: N/A;   ORIF FIFTH METACARPAL FX  RIGHT HAND  04/21/2002   POLYPECTOMY     REVISION AND RE-SITING LAP-BAND PORT  04/08/2010   W/  UPPER EGD   RIGHT KNEE PATELLECTOMY W/ REPAIR OF EXTENSOR MECHANISM  04/14/2002   TEE WITHOUT CARDIOVERSION N/A 12/24/2019  Procedure: TRANSESOPHAGEAL ECHOCARDIOGRAM (TEE);  Surgeon: Sueanne Margarita, MD;  Location: [facility];  Service: Cardiovascular;  Laterality: N/A;   Toenail removed Left 05/21/2014   2nd toenail-  Dr. Barkley Bruns    Current Medications: Current Meds  Medication Sig   Blood Glucose Monitoring Suppl (ONETOUCH VERIO IQ SYSTEM) w/Device KIT USE TO CHECK SUGAR DAILY   cetirizine (ZYRTEC) 10 MG tablet Take 10 mg by mouth daily as needed for allergies.   diltiazem (CARDIZEM) 30 MG tablet Take 1 Tablet Every 4 Hours As Needed For HR >100 And TOP BP >100   dorzolamide-timolol (COSOPT) 22.3-6.8 MG/ML ophthalmic solution Place 1 drop into the right eye  2 (two) times daily.    doxycycline (VIBRAMYCIN) 100 MG capsule Take 100 mg by mouth 2 (two) times daily.   ezetimibe (ZETIA) 10 MG tablet TAKE ONE TABLET BY MOUTH EVERYDAY AT BEDTIME   Glucagon (GVOKE HYPOPEN 2-PACK) 1 MG/0.2ML SOAJ Inject 1 Act into the skin daily as needed.   HUMALOG 100 UNIT/ML cartridge DIAL AND INJECT 14-20 UNITS UNDER THE SKIN 3 (THREE) TIMES DAILY BEFORE MEALS AS DIRECTED.   HYDROcodone bit-homatropine (HYCODAN) 5-1.5 MG/5ML syrup Take 5 mLs by mouth every 4 (four) hours as needed.   Insulin Glargine (BASAGLAR KWIKPEN) 100 UNIT/ML Inject 50 Units into the skin at bedtime. [place] patient assistance (Patient taking differently: Inject 40-42 Units into the skin at bedtime. [place] patient assistance taking 46 units)   insulin lispro (HUMALOG) 100 UNIT/ML injection Inject 10-14 Units into the skin 3 (three) times daily before meals. [place] patient assistance Sliding scale   Insulin Pen Needle (PEN NEEDLES) 32G X 4 MM MISC Inject 1 pen  as directed 4 (four) times daily. Use to inject levemir or novolog as directed.   latanoprost (XALATAN) 0.005 % ophthalmic solution Place 1 drop into both eyes at bedtime.    losartan (COZAAR) 100 MG tablet TAKE ONE TABLET BY MOUTH EVERYDAY AT BEDTIME   magnesium oxide (MAG-OX) 400 (240 Mg) MG tablet TAKE ONE TABLET BY MOUTH EVERYDAY AT BEDTIME   nebivolol (BYSTOLIC) 10 MG tablet Take 1 tablet (10 mg total) by mouth daily.   omeprazole (PRILOSEC) 40 MG capsule TAKE ONE CAPSULE BY MOUTH EVERY MORNING and TAKE ONE CAPSULE BY MOUTH EVERY EVENING   ONETOUCH VERIO test strip Use to check blood sugar twice daily.   pravastatin (PRAVACHOL) 40 MG tablet TAKE ONE TABLET BY MOUTH EVERYDAY AT BEDTIME   Semaglutide, 1 MG/DOSE, (OZEMPIC, 1 MG/DOSE,) 4 MG/3ML SOPN Inject 1 mg into the skin once a week. [employer] patient assistance   SYNJARDY XR 12.08-998 MG TB24 Take 1 tablet by mouth once daily   XARELTO 20 MG TABS tablet Take  1 tablet by mouth once daily   [DISCONTINUED] hydrALAZINE (APRESOLINE) 50 MG tablet Take 1 tablet (50 mg total) by mouth 3 (three) times daily.   [DISCONTINUED] hydrochlorothiazide (HYDRODIURIL) 25 MG tablet Take 1 tablet (25 mg total) by mouth daily.     Allergies:   Amlodipine, Atorvastatin, Ampicillin, Codeine, Lisinopril, Penicillins, and Januvia [sitagliptin]   Social History   Socioeconomic History   Marital status: Single    Spouse name: Not on file   Number of children: Not on file   Years of education: Not on file   Highest education level: Not on file  Occupational History   Occupation: Quarry manager: [employer]  Tobacco Use   Smoking status: Former  Packs/day: 1.00    Years: 28.00    Total pack years: 28.00    Types: Cigarettes    Quit date: 09/05/2001    Years since quitting: 20.5    Passive exposure: Never   Smokeless tobacco: Never  Vaping Use   Vaping Use: Never used  Substance and Sexual Activity   Alcohol use: No    Alcohol/week: 0.0 standard drinks of alcohol   Drug use: No   Sexual activity: Yes  Other Topics Concern   Not on file  Social History Narrative   Regular exercise- yes   Social Determinants of Health   Financial Resource Strain: Low Risk  (12/07/2021)   Overall Financial Resource Strain (CARDIA)    Difficulty of Paying Living Expenses: Not hard at all  Food Insecurity: No Food Insecurity (12/07/2021)   Hunger Vital Sign    Worried About Running Out of Food in the Last Year: Never true    Ran Out of Food in the Last Year: Never true  Transportation Needs: No Transportation Needs (12/07/2021)   PRAPARE - Hydrologist (Medical): No    Lack of Transportation (Non-Medical): No  Physical Activity: Inactive (12/07/2021)   Exercise Vital Sign    Days of Exercise per Week: 0 days    Minutes of Exercise per Session: 0 min  Stress: No Stress Concern Present (12/07/2021)   [place]    Feeling of Stress : Not at all  Social Connections: Moderately Integrated (12/07/2021)   Social Connection and Isolation Panel [NHANES]    Frequency of Communication with Friends and Family: More than three times a week    Frequency of Social Gatherings with Friends and Family: More than three times a week    Attends Religious Services: More than 4 times per year    Active Member of [employer] or Organizations: Yes    Attends Music therapist: More than 4 times per year    Marital Status: Never married     Family History: The patient's family history includes Arthritis in an other family member; Cancer in her brother and another family member; Colon cancer in her maternal aunt; Colon polyps in her brother; Deep vein thrombosis in her father and sister; Diabetes in her brother, brother, brother, father, and sister; Heart disease in her brother, father, and sister; Hyperlipidemia in her brother, father, mother, and sister; Hypertension in an other family member; Kidney disease in her mother; Other in her mother; Stroke in an other family member. There is no history of Esophageal cancer, Stomach cancer, Inflammatory bowel disease, Liver disease, Pancreatic cancer, Crohn's disease, or Rectal cancer.  ROS:   Please see the history of present illness.    (+) Fatigue All other systems reviewed and are negative.  EKGs/Labs/Other Studies Reviewed:    The following studies were reviewed today:  Carotid Duplex 10/25/2020: Summary:  Right Carotid: Velocities in the right ICA are consistent with a 1-39%  stenosis.   Left Carotid: Velocities in the left ICA are consistent with a 1-39%  stenosis.   Vertebrals:  Bilateral vertebral arteries demonstrate antegrade flow.  Subclavians: Normal flow hemodynamics were seen in bilateral subclavian arteries.    ABI 10/25/2020: Summary:  Right: Resting right ankle-brachial index indicates moderate right  lower  extremity arterial disease. The right toe-brachial index is abnormal.   Left: Resting left ankle-brachial index indicates moderate left lower  extremity arterial disease. The left toe-brachial index  is abnormal.    Echo 10/07/2020: 1. Left ventricular ejection fraction, by estimation, is 65 to 70%. The  left ventricle has normal function. The left ventricle has no regional  wall motion abnormalities. There is mild left ventricular hypertrophy.  Left ventricular diastolic parameters  are consistent with Grade II diastolic dysfunction (pseudonormalization).  Elevated left atrial pressure.   2. Right ventricular systolic function is normal. The right ventricular  size is normal. Tricuspid regurgitation signal is inadequate for assessing  PA pressure.   3. Left atrial size was severely dilated.   4. Right atrial size was mildly dilated.   5. The mitral valve is abnormal. Severe mitral annular calcification. No  evidence of mitral valve regurgitation. Mild to moderate mitral stenosis.  MG 80mHg, MVA 1.4 cm^2 by continuity equation   6. The aortic valve is tricuspid. Aortic valve regurgitation is not  visualized. Mild to moderate aortic valve sclerosis/calcification is  present, without any evidence of aortic stenosis.   7. The inferior vena cava is normal in size with greater than 50%  respiratory variability, suggesting right atrial pressure of 3 mmHg.    Abdominal aortogram 03/09/2020: Findings:               Aortogram: No significant renal artery stenosis was identified.  The infrarenal abdominal aorta is widely patent.  Bilateral common and external iliac arteries widely patent.               Right Lower Extremity: Diffuse calcific disease within the right common femoral artery with approximate 70% stenosis.  The profundofemoral artery is patent.  The superficial femoral artery is a flush occlusion.  There is reconstitution from geniculate collaterals of the anterior tibial artery  which backfills into a small below-knee popliteal artery.  There does appear to be opacification of all 3 tibial vessels however they are very small in caliber               Left Lower Extremity: Left common femoral profundofemoral artery widely patent.  The femoral-popliteal bypass graft is occluded.  The superficial femoral artery is occluded.  There is reconstitution of the anterior tibial artery and opacification of all 3 tibial vessels which are very small in caliber   Intervention: None, a minx was used for closure   Impression:               #1  Occluded left femoral-popliteal bypass graft and left superficial femoral artery               #2  High-grade calcific stenosis within the right common femoral artery, greater than 70%.  There is a flush occlusion of the right superficial femoral artery with reconstitution of the anterior tibial artery with backfilling of the below-knee popliteal artery.  The tibial vessels are small in caliber.               #3  The patient will be considered for right femoral endarterectomy with femoral below-knee popliteal artery bypass graft   TEE 12/24/2019:  1. Left ventricular ejection fraction, by estimation, is 60 to 65%. The  left ventricle has normal function. The left ventricle has no regional  wall motion abnormalities.   2. Right ventricular systolic function is normal. The right ventricular  size is mildly enlarged.   3. Left atrial size was moderately dilated. No left atrial/left atrial  appendage thrombus was detected.   4. 3D imaging of mitral valve was performed. The mitral valve is  degenerative  in appearance. There is severe thickening of the posterior  mitral valve leaflet(s). There is severe calcification of the posterior  mitral valve leaflet(s). Fixed posterior  leaflet of the mitral valve leaflets. Severe mitral annular calcification.  No evidence of mitral valve regurgitation. Very Mild mitral stenosis. The  mean mitral valve gradient  is 2.5 mmHg.   5. The aortic valve is normal in structure. Aortic valve regurgitation is  trivial. Mild aortic valve sclerosis is present, with no evidence of  aortic valve stenosis.   6. The inferior vena cava is normal in size with greater than 50%  respiratory variability, suggesting right atrial pressure of 3 mmHg.   Conclusion(s)/Recommendation(s): Normal biventricular function without  evidence of hemodynamically significant valvular heart disease.   EKG:  EKG is personally reviewed and interpreted. 05/26/2021: Atrial fibrillation. Rate 119 bpm. 02/01/2021: Sinus bradycardia. Rate 56 bpm.  Recent Labs: 06/23/2021: TSH 0.51 12/14/2021: Hemoglobin 13.9; Platelets 304 04/04/2022: ALT 11; BUN 11; Creatinine, Ser 0.70; Potassium 4.3; Sodium 139   Recent Lipid Panel    Component Value Date/Time   CHOL 223 (H) 04/04/2022 1005   TRIG 192.0 (H) 04/04/2022 1005   HDL 53.40 04/04/2022 1005   CHOLHDL 4 04/04/2022 1005   VLDL 38.4 04/04/2022 1005   LDLCALC 131 (H) 04/04/2022 1005   LDLDIRECT 160.2 03/01/2010 1001         Physical Exam:    VS:  BP (!) 150/60 (BP Location: Left Arm, Patient Position: Sitting, Cuff Size: Normal)   Pulse 62   Ht 5' 4.5" (1.638 m)   Wt 168 lb (76.2 kg)   LMP  (LMP Unknown)   SpO2 97%   BMI 28.39 kg/m     Wt Readings from Last 3 Encounters:  04/06/22 168 lb (76.2 kg)  04/04/22 168 lb (76.2 kg)  03/22/22 167 lb (75.8 kg)    GEN: Well nourished, well developed, in no acute distress HEENT: normal Neck: no JVD, carotid bruits, or masses Cardiac: RRR; no murmurs, rubs, or gallops,no edema  Respiratory:  clear to auscultation bilaterally, normal work of breathing GI: soft, nontender, nondistended, + BS MS: no deformity or atrophy Skin: warm and dry, no rash Neuro:  Alert and Oriented x 3, Strength and sensation are intact Psych: euthymic mood, full affect   ASSESSMENT:    1. Paroxysmal atrial fibrillation (HCC)   2. Atherosclerosis of native  arteries of the extremities with ulceration ([place])   3. Diabetes mellitus with coincident hypertension ([place])      PLAN:    In order of problems listed above:  Atrial fibrillation ([place]) Dr. Curt Bears ablation on 08/02/2021. AFlutter post ablation 117 bpm surrounding COVID and personal stress. AFIB clinic, [name]. Bystolic 10 mg.   Cardizem.  She has not missed any doses of Xarelto.  We counseled her that if she goes back into atrial fibrillation, she knows to take an additional diltiazem.  She does not need to go to the emergency room.  Sometimes her episodes will last 2 to 3 days when she is in the middle of a flu or cold.  If she begins to have episodes outside of being sick, we will have her sit down with Dr. Aliene Beams again to discuss second ablation.    Chronic anticoagulation Continuing with Xarelto.  Has not missed any doses.  Prior hemoglobin 13.9 creatinine 0.76.   Hypertension 160-170 SBP. Went to see PCP. HCTZ brought it down. 110/50. Scared her. Last week put on Hydralazine 3 times. Not taking  too hard for compliance. Will start HCTZ 12.5 milligrams once a day.  She thought that the 25 was too strong causing her blood pressure to get too low.  Continue to monitor at home.    Follow-up: 6 months.  Medication Adjustments/Labs and Tests Ordered: Current medicines are reviewed at length with the patient today.  Concerns regarding medicines are outlined above.   No orders of the defined types were placed in this encounter.  Meds ordered this encounter  Medications   hydrochlorothiazide (HYDRODIURIL) 25 MG tablet    Sig: Take 0.5 tablets (12.5 mg total) by mouth daily.    Dispense:  45 tablet    Refill:  3    **Dose change** decrease to 12.5 mg daily/ please d/c any other RX for HCTZ   Patient Instructions  Medication Instructions:  Please discontinue your Hydralazine. Decrease Hydrochlorothiazide to 12.5 mg daily. Continue all other medications as listed.  *If you need a  refill on your cardiac medications before your next appointment, please call your pharmacy*  Follow-Up: At [facility], you and your health needs are our priority.  As part of our continuing mission to provide you with exceptional heart care, we have created designated Provider Care Teams.  These Care Teams include your primary Cardiologist (physician) and Advanced Practice Providers (APPs -  Physician Assistants and Nurse Practitioners) who all work together to provide you with the care you need, when you need it.  We recommend signing up for the patient portal called "MyChart".  Sign up information is provided on this After Visit Summary.  MyChart is used to connect with patients for Virtual Visits (Telemedicine).  Patients are able to view lab/test results, encounter notes, upcoming appointments, etc.  Non-urgent messages can be sent to your provider as well.   To learn more about what you can do with MyChart, go to [email or web address].    Your next appointment:   6 month(s)  The format for your next appointment:   In Person  Provider:   Nicholes Rough, PA-C, Melina Copa, PA-C, Ambrose Pancoast, NP, Ermalinda Barrios, PA-C, Christen Bame, NP, or Richardson Dopp, PA-C     Then, [name], MD will plan to see you again in 1 year(s).     Important Information About Sugar          Signed, [name], MD  04/06/2022 9:13 AM    [facility]

## 2022-04-06 NOTE — Patient Instructions (Signed)
Medication Instructions:  Please discontinue your Hydralazine. Decrease Hydrochlorothiazide to 12.5 mg daily. Continue all other medications as listed.  *If you need a refill on your cardiac medications before your next appointment, please call your pharmacy*  Follow-Up: At [facility], you and your health needs are our priority.  As part of our continuing mission to provide you with exceptional heart care, we have created designated Provider Care Teams.  These Care Teams include your primary Cardiologist (physician) and Advanced Practice Providers (APPs -  Physician Assistants and Nurse Practitioners) who all work together to provide you with the care you need, when you need it.  We recommend signing up for the patient portal called "MyChart".  Sign up information is provided on this After Visit Summary.  MyChart is used to connect with patients for Virtual Visits (Telemedicine).  Patients are able to view lab/test results, encounter notes, upcoming appointments, etc.  Non-urgent messages can be sent to your provider as well.   To learn more about what you can do with MyChart, go to [email or web address].    Your next appointment:   6 month(s)  The format for your next appointment:   In Person  Provider:   Nicholes Rough, PA-C, Melina Copa, PA-C, Ambrose Pancoast, NP, Ermalinda Barrios, PA-C, Christen Bame, NP, or Richardson Dopp, PA-C     Then, [name], MD will plan to see you again in 1 year(s).     Important Information About Sugar

## 2022-04-09 MED ORDER — NEXLETOL 180 MG PO TABS: 1.0000 | ORAL_TABLET | Freq: Every day | ORAL | 1 refills | Status: DC

## 2022-04-09 MED ORDER — SYNJARDY XR 25-1000 MG PO TB24: 1.0000 | ORAL_TABLET | Freq: Every day | ORAL | 1 refills | Status: DC

## 2022-04-12 ENCOUNTER — Telehealth: Payer: Self-pay | Admitting: Internal Medicine

## 2022-04-12 DIAGNOSIS — E785 Hyperlipidemia, unspecified: Secondary | ICD-10-CM

## 2022-04-12 DIAGNOSIS — S98132A Complete traumatic amputation of one left lesser toe, initial encounter: Secondary | ICD-10-CM

## 2022-04-12 DIAGNOSIS — I7025 Atherosclerosis of native arteries of other extremities with ulceration: Secondary | ICD-10-CM

## 2022-04-12 NOTE — Telephone Encounter (Signed)
Patient called and requested a callback she has some paperwork that needs to be filled out and has questions about a prescription that was put in for her and she was unaware about it. A good callback number for the patient is [phone].

## 2022-04-13 MED ORDER — NEXLETOL 180 MG PO TABS: 1.0000 | ORAL_TABLET | Freq: Every day | ORAL | 1 refills | Status: DC

## 2022-04-13 NOTE — Telephone Encounter (Signed)
Pt inquire in regard to PAP application. We are still waiting for determination. Pt informed.  Pt would like to know if Nexletol is replacing Zetia and Pravastatin or is it in addition to those medications.   Per pt request she wanted [facility] deleted and [facility] [place] [place].

## 2022-04-17 NOTE — Telephone Encounter (Signed)
LVM informing pt that Nexletol is in addition to her 2 other cholesterol meds.

## 2022-04-18 ENCOUNTER — Ambulatory Visit: Payer: PPO | Admitting: Gastroenterology

## 2022-04-18 ENCOUNTER — Other Ambulatory Visit: Payer: Self-pay | Admitting: Internal Medicine

## 2022-04-18 DIAGNOSIS — E785 Hyperlipidemia, unspecified: Secondary | ICD-10-CM

## 2022-04-18 DIAGNOSIS — I10 Essential (primary) hypertension: Secondary | ICD-10-CM

## 2022-04-18 DIAGNOSIS — I7025 Atherosclerosis of native arteries of other extremities with ulceration: Secondary | ICD-10-CM

## 2022-04-18 MED ORDER — LOSARTAN POTASSIUM 100 MG PO TABS: 100.0000 mg | ORAL_TABLET | Freq: Every day | ORAL | 1 refills | Status: DC

## 2022-04-18 MED ORDER — REPATHA 140 MG/ML ~~LOC~~ SOSY: 1.0000 | PREFILLED_SYRINGE | SUBCUTANEOUS | 1 refills | Status: DC

## 2022-04-18 NOTE — Telephone Encounter (Signed)
[facility] - [facility] needs to be sent to [facility] [address] in [place]

## 2022-04-18 NOTE — Telephone Encounter (Signed)
Patient also needs losartan and it needs to go to [place], [employer]

## 2022-04-18 NOTE — Progress Notes (Unsigned)
[facility] progress note:  History: [facility] 04/19/2022  Referring provider: [name], MD  Reason for consult/chief complaint: hx of barretts (Pt due for an endo/colon; pt has no GI complaints at this time)   Subjective  HPI: Most recent summary of GI issues from my May 2023 office note: I last saw [facility] in March 2020 for an upper endoscopy that discovered Barrett's esophagus with "at least high-grade dysplasia" and an area of nodularity at the GE junction. She had upper endoscopy with Dr. Rush Landmark in June 2020 with plans for an EMR of that area, though nodular mucosa was no longer present.  Repeat biopsies confirmed Barrett's with high-grade dysplasia.  In July 2020 she underwent RFA of the Barrett's, and had further RFA in October 2020 and again in April 2021 (though no Barrett's was seen on the biopsies taken from that EGD).   In March of this year her name came up for endoscopic recall, at which time I did chart review and saw that she was under treatment for A-fib with a planned ablation.  I sent her a chart message recommending that I see her in the office toward the end of this month.  Her ablation was performed on 08/02/2021, and the follow-up office note from 08/11/2021 was reviewed indicating she was doing well in sinus rhythm with plans to complete by systolic 10 mg daily and Xarelto 20 mg daily for 3 months. Last colonoscopy February 2020 with multiple adenomatous polyps, including 1 over 10 mm.   [facility] says she is feeling well from a digestive standpoint, denying heartburn dysphagia odynophagia, nausea vomiting altered bowel habits or rectal bleeding. She is feeling well since the ablation and is due to see cardiology next week."  The[facility] was scheduled for EGD and colonoscopy in September, then canceled due to ongoing cardiac issues. ______________________________   [facility] is generally feeling well since her cardiac issues have stabilized.  She has  intermittent dysphagia to solids and liquids going on for at least the last year.  Denies nausea or vomiting, altered bowel habits or rectal bleeding.  Appetite generally good and weight stable.   ROS:  Review of Systems  Constitutional:  Negative for appetite change and unexpected weight change.  HENT:  Negative for mouth sores and voice change.   Eyes:  Negative for pain and redness.  Respiratory:  Negative for cough and shortness of breath.   Cardiovascular:  Negative for chest pain and palpitations.  Genitourinary:  Negative for dysuria and hematuria.  Musculoskeletal:  Negative for arthralgias and myalgias.  Skin:  Negative for pallor and rash.  Neurological:  Negative for weakness and headaches.  Hematological:  Negative for adenopathy.     Past Medical History: Past Medical History:  Diagnosis Date   Allergy    Anxiety    Pt. denies   Arthritis    right index figer   Asymptomatic cholelithiasis    Atherosclerosis of aorta (HCC)    Cataract    Clotting disorder ([place])    Gallstones 06/2013   GERD (gastroesophageal reflux disease)    Glaucoma    "A LITTLE BIT"   History of kidney stones    lithotrispy   Hyperlipidemia    Hypertension    Hypothyroidism    no meds   Iron deficiency anemia    PAD (peripheral artery disease) (HCC)    PAF (paroxysmal atrial fibrillation) (HCC)    Peripheral arterial occlusive disease (HCC)    lower extremities  Pneumonia    PONV (postoperative nausea and vomiting)    Right ureteral stone    Type 2 diabetes mellitus (HCC)    Type   Vitamin B 12 deficiency    Wears glasses    She was seen in November by her vascular surgeon, and that note details her peripheral arterial disease with prior interventions.  Claudication symptoms reportedly stable at that most recent visit.  04/06/2022 cardiology office note [facility] Atrial fibrillation ([place]) Dr. Curt [facility] ablation on 08/02/2021. AFlutter post ablation 117 bpm surrounding COVID and  personal stress. AFIB clinic, [facility]. Bystolic 10 mg.   Cardizem.  She has not missed any doses of Xarelto.  We counseled her that if she goes back into atrial fibrillation, she knows to take an additional diltiazem.  She does not need to go to the emergency room.  Sometimes her episodes will last 2 to 3 days when she is in the middle of a flu or cold.  If she begins to have episodes outside of being sick, we will have her sit down with Dr. Aliene Harrington again to discuss second ablation.    Chronic anticoagulation Continuing with Xarelto.  Has not missed any doses.  Prior hemoglobin 13.9 creatinine 0.76.   Hypertension 160-170 SBP. Went to see PCP. HCTZ brought it down. 110/50. Scared her. Last week put on Hydralazine 3 times. Not taking too hard for compliance. Will start HCTZ 12.5 milligrams once a day.  She thought that the 25 was too strong causing her blood pressure to get too low.  Continue to monitor at home.  Past Surgical History: Past Surgical History:  Procedure Laterality Date   ABDOMINAL AORTOGRAM W/LOWER EXTREMITY N/A 03/09/2020   Procedure: ABDOMINAL AORTOGRAM W/LOWER EXTREMITY;  Surgeon: Theresa Mitchell, MD;  [facility];  Service: Cardiovascular;  Laterality: N/A;   AMPUTATION Left 07/16/2014   Procedure: LEFT SECOND TOE AMPUTATION;  Surgeon: Theresa Mitchell, MD;  Location: [facility];  Service: Vascular;  Laterality: Left;  With Nerve block   ATRIAL FIBRILLATION ABLATION N/A 08/02/2021   Procedure: ATRIAL FIBRILLATION ABLATION;  Surgeon: Theresa Haw, MD;  Location: [place] [facility];  Service: Cardiovascular;  Laterality: N/A;   AUGMENTATION MAMMAPLASTY     BELPHAROPTOSIS REPAIR     eyelid lift   BIOPSY  10/14/2018   Procedure: BIOPSY;  Surgeon: Rush Landmark Telford Nab., MD;  Location: [facility];  Service: Gastroenterology;;   BIOPSY  08/04/2019   Procedure: BIOPSY;  Surgeon: Theresa Copas., MD;  Location: [facility];  Service:  Gastroenterology;;   CARDIOVERSION N/A 09/19/2019   Procedure: CARDIOVERSION;  Surgeon: Theresa Dresser, MD;  Location: [place];  Service: Cardiovascular;  Laterality: N/A;   COLONOSCOPY     COMBINED AUGMENTATION MAMMAPLASTY AND ABDOMINOPLASTY  2009   W/  BILATERAL  THIGH LIFT   CYSTO/  RIGHT URETERAL STENT PLACEMENT  12/30/2010   CYSTOSCOPY WITH RETROGRADE PYELOGRAM, URETEROSCOPY AND STENT PLACEMENT Right 10/07/2013   Procedure: CYSTOSCOPY WITH RETROGRADE PYELOGRAM, right URETEROSCOPY AND STENT PLACEMENT, stone extraction;  Surgeon: Arvil Persons, MD;  Location: [facility];  Service: Urology;  Laterality: Right;   CYSTOSCOPY WITH STENT PLACEMENT Right 06/04/2013   Procedure: CYSTOSCOPY WITH STENT PLACEMENT;  Surgeon: Franchot Gallo, MD;  [facility];  Service: Urology;  Laterality: Right;   DILATATION & CURETTAGE/HYSTEROSCOPY WITH MYOSURE N/A 04/27/2015   Procedure: DILATATION & CURETTAGE/HYSTEROSCOPY WITH MYOSURE;  Surgeon: Terrance Mass, MD;  Location: [facility];  Service: Gynecology;  Laterality: N/A;   ENDARTERECTOMY FEMORAL Left 06/08/2014   Procedure: Left Leg Common Femoral and External Iliac  Endartarectomy with patch Angioplasty;  Surgeon: Rosetta Posner, MD;  Location: [facility];  Service: Vascular;  Laterality: Left;   ESOPHAGOGASTRODUODENOSCOPY N/A 10/14/2018   Procedure: ESOPHAGOGASTRODUODENOSCOPY (EGD);  Surgeon: Theresa Copas., MD;  Location: [place];  Service: Gastroenterology;  Laterality: N/A;   ESOPHAGOGASTRODUODENOSCOPY (EGD) WITH PROPOFOL N/A 11/06/2018   Procedure: ESOPHAGOGASTRODUODENOSCOPY (EGD) WITH PROPOFOL;  Surgeon: Rush Landmark Telford Nab., MD;  [facility];  Service: Gastroenterology;  Laterality: N/A;  RFA   ESOPHAGOGASTRODUODENOSCOPY (EGD) WITH PROPOFOL N/A 02/12/2019   Procedure: ESOPHAGOGASTRODUODENOSCOPY (EGD) WITH PROPOFOL;  Surgeon: Rush Landmark Telford Nab., MD;  [facility];  Service:  Gastroenterology;  Laterality: N/A;   ESOPHAGOGASTRODUODENOSCOPY (EGD) WITH PROPOFOL N/A 08/04/2019   Procedure: ESOPHAGOGASTRODUODENOSCOPY (EGD) WITH PROPOFOL;  Surgeon: Rush Landmark Telford Nab., MD;  Location: [place];  Service: Gastroenterology;  Laterality: N/A;  WITH RFA   EXTRACORPOREAL SHOCK WAVE LITHOTRIPSY Right 08-04-2013//   06-23-2013//   01-16-2011   EYE SURGERY Bilateral    cataract   FEMORAL-POPLITEAL BYPASS GRAFT Left 06/08/2014   Procedure: Left Leg Femoral -Popliteal Bypass Graft;  Surgeon: Rosetta Posner, MD;  Location: [facility];  Service: Vascular;  Laterality: Left;   FEMORAL-POPLITEAL BYPASS GRAFT Left 06/09/2014   Procedure: Left Femoral and Popliteal Exposure; Left Femoral to Anterior Tibial Bypass Graft using Propaten 80m by 80cm Goretex Graft; Left Tibial Endarterectomy; Left Femoraland Popliteal Thrombectomy ;  Surgeon: VSerafina Mitchell MD;  [place] [facility]  Service: Vascular;  Laterality: Left;   GI RADIOFREQUENCY ABLATION N/A 11/06/2018   Procedure: GI RADIOFREQUENCY ABLATION;  Surgeon: MIrving Copas, MD;  [facility];  Service: Gastroenterology;  Laterality: N/A;   GI RADIOFREQUENCY ABLATION N/A 02/12/2019   Procedure: GI RADIOFREQUENCY ABLATION;  Surgeon: MRush LandmarkGTelford Nab, MD;  [facility];  Service: Gastroenterology;  Laterality: N/A;   GI RADIOFREQUENCY ABLATION N/A 08/04/2019   Procedure: GI RADIOFREQUENCY ABLATION;  Surgeon: MRush LandmarkGTelford Nab, MD;  [place]  Service: Gastroenterology;  Laterality: N/A;   HOLMIUM LASER APPLICATION Right 023/76/2831  Procedure: HOLMIUM LASER APPLICATION;  Surgeon: MArvil Persons MD;  [facility]  Service: Urology;  Laterality: Right;   KIDNEY STONE SURGERY  07/2013   1-2 stones   LAPAROSCOPIC GASTRIC BANDING  05/29/2005   LITHOTRIPSY  2-3 times   LOWER EXTREMITY ANGIOGRAM N/A 06/04/2014   Procedure: LOWER EXTREMITY ANGIOGRAM;  Surgeon: VSerafina Mitchell MD;  [facility];  Service: Cardiovascular;  Laterality: N/A;   ORIF FIFTH METACARPAL FX  RIGHT HAND  04/21/2002   POLYPECTOMY     REVISION AND RE-SITING LAP-BAND PORT  04/08/2010   W/  UPPER EGD   RIGHT KNEE PATELLECTOMY W/ REPAIR OF EXTENSOR MECHANISM  04/14/2002   TEE WITHOUT CARDIOVERSION N/A 12/24/2019   Procedure: TRANSESOPHAGEAL ECHOCARDIOGRAM (TEE);  Surgeon: TSueanne Margarita MD;  [facility];  Service: Cardiovascular;  Laterality: N/A;   Toenail removed Left 05/21/2014   2nd toenail-  Dr. PBarkley Bruns    Family History: Family History  Problem Relation Age of Onset   Kidney disease Mother    Hyperlipidemia Mother    Other Mother        AAA   and    Amputation   Diabetes Father    Heart disease Father    Deep vein thrombosis Father    Hyperlipidemia Father    Diabetes  Sister    Heart disease Sister    Deep vein thrombosis Sister    Hyperlipidemia Sister    Diabetes Brother    Heart disease Brother    Cancer Brother    Hyperlipidemia Brother    Diabetes Brother    Diabetes Brother    Colon polyps Brother    Colon cancer Maternal Aunt    Arthritis Other    Cancer Other        colon   Hypertension Other    Stroke Other    Esophageal cancer Neg Hx    Stomach cancer Neg Hx    Inflammatory bowel disease Neg Hx    Liver disease Neg Hx    Pancreatic cancer Neg Hx    Crohn's disease Neg Hx    Rectal cancer Neg Hx     Social History: Social History   Socioeconomic History   Marital status: Single    Spouse name: Not on file   Number of children: Not on file   Years of education: Not on file   Highest education level: Not on file  Occupational History   Occupation: Quarry manager: [employer]  Tobacco Use   Smoking status: Former    Packs/day: 1.00    Years: 28.00    Total pack years: 28.00    Types: Cigarettes    Quit date: 09/05/2001    Years since quitting: 20.6    Passive exposure: Never   Smokeless tobacco:  Never  Vaping Use   Vaping Use: Never used  Substance and Sexual Activity   Alcohol use: No    Alcohol/week: 0.0 standard drinks of alcohol   Drug use: No   Sexual activity: Yes  Other Topics Concern   Not on file  Social History Narrative   Regular exercise- yes   Social Determinants of Health   Financial Resource Strain: Low Risk  (12/07/2021)   Overall Financial Resource Strain (CARDIA)    Difficulty of Paying Living Expenses: Not hard at all  Food Insecurity: No Food Insecurity (12/07/2021)   Hunger Vital Sign    Worried About Running Out of Food in the Last Year: Never true    Ran Out of Food in the Last Year: Never true  Transportation Needs: No Transportation Needs (12/07/2021)   PRAPARE - Hydrologist (Medical): No    Lack of Transportation (Non-Medical): No  Physical Activity: Inactive (12/07/2021)   Exercise Vital Sign    Days of Exercise per Week: 0 days    Minutes of Exercise per Session: 0 min  Stress: No Stress Concern Present (12/07/2021)   [place]    Feeling of Stress : Not at all  Social Connections: Moderately Integrated (12/07/2021)   Social Connection and Isolation Panel [NHANES]    Frequency of Communication with Friends and Family: More than three times a week    Frequency of Social Gatherings with Friends and Family: More than three times a week    Attends Religious Services: More than 4 times per year    Active Member of [employer] or Organizations: Yes    Attends Archivist Meetings: More than 4 times per year    Marital Status: Never married    Allergies: Allergies  Allergen Reactions   Amlodipine Swelling   Atorvastatin Other (See Comments)    Muscle aches    Ampicillin Nausea And Vomiting and Other (See Comments)  Codeine Nausea And Vomiting    Ask patient   Lisinopril Cough        Penicillins Nausea And Vomiting    Did it involve swelling of  the face/tongue/throat, SOB, or low BP? No Did it involve sudden or severe rash/hives, skin peeling, or any reaction on the inside of your mouth or nose? No Did you need to seek medical attention at a hospital or doctor's office? No When did it last happen?      20+ years If all above answers are "NO", may proceed with cephalosporin use.    Januvia [Sitagliptin] Nausea And Vomiting    Outpatient Meds: Current Outpatient Medications  Medication Sig Dispense Refill   Blood Glucose Monitoring Suppl (ONETOUCH VERIO IQ SYSTEM) w/Device KIT USE TO CHECK SUGAR DAILY 1 kit 0   cetirizine (ZYRTEC) 10 MG tablet Take 10 mg by mouth daily as needed for allergies.     Continuous Blood Gluc Receiver (DEXCOM G6 RECEIVER) DEVI 1 Act by Does not apply route daily. 1 each 5   Continuous Blood Gluc Sensor (DEXCOM G6 SENSOR) MISC 1 Act by Does not apply route daily. 1 each 5   Continuous Blood Gluc Transmit (DEXCOM G6 TRANSMITTER) MISC 1 Act by Does not apply route daily. 1 each 5   diltiazem (CARDIZEM) 30 MG tablet Take 1 Tablet Every 4 Hours As Needed For HR >100 And TOP BP >100 45 tablet 1   dorzolamide-timolol (COSOPT) 22.3-6.8 MG/ML ophthalmic solution Place 1 drop into the right eye 2 (two) times daily.      Empagliflozin-metFORMIN HCl ER (SYNJARDY XR) 25-1000 MG TB24 Take 1 tablet by mouth daily. 90 tablet 1   Evolocumab (REPATHA) 140 MG/ML SOSY Inject 1 Act into the skin every 14 (fourteen) days. 6.3 mL 1   Glucagon (GVOKE HYPOPEN 2-PACK) 1 MG/0.2ML SOAJ Inject 1 Act into the skin daily as needed. 2 mL 5   HUMALOG 100 UNIT/ML cartridge DIAL AND INJECT 14-20 UNITS UNDER THE SKIN 3 (THREE) TIMES DAILY BEFORE MEALS AS DIRECTED. 75 mL 0   hydrochlorothiazide (HYDRODIURIL) 25 MG tablet Take 0.5 tablets (12.5 mg total) by mouth daily. 45 tablet 3   Insulin Glargine (BASAGLAR KWIKPEN) 100 UNIT/ML Inject 50 Units into the skin at bedtime. [place] patient assistance (Patient taking differently: Inject  40-42 Units into the skin at bedtime. [place] patient assistance taking 46 units) 30 mL 2   insulin lispro (HUMALOG) 100 UNIT/ML injection Inject 10-14 Units into the skin 3 (three) times daily before meals. [place] patient assistance Sliding scale     Insulin Pen Needle (PEN NEEDLES) 32G X 4 MM MISC Inject 1 pen  as directed 4 (four) times daily. Use to inject levemir or novolog as directed. 400 each 3   latanoprost (XALATAN) 0.005 % ophthalmic solution Place 1 drop into both eyes at bedtime.      losartan (COZAAR) 100 MG tablet Take 1 tablet (100 mg total) by mouth daily. 90 tablet 1   magnesium oxide (MAG-OX) 400 (240 Mg) MG tablet TAKE ONE TABLET BY MOUTH EVERYDAY AT BEDTIME 90 tablet 1   nebivolol (BYSTOLIC) 10 MG tablet Take 1 tablet (10 mg total) by mouth daily. 90 tablet 3   omeprazole (PRILOSEC) 40 MG capsule TAKE ONE CAPSULE BY MOUTH EVERY MORNING and TAKE ONE CAPSULE BY MOUTH EVERY EVENING 180 capsule 1   ONETOUCH VERIO test strip Use to check blood sugar twice daily. 200 strip 3   pravastatin (  PRAVACHOL) 40 MG tablet TAKE ONE TABLET BY MOUTH EVERYDAY AT BEDTIME 90 tablet 1   Semaglutide, 1 MG/DOSE, (OZEMPIC, 1 MG/DOSE,) 4 MG/3ML SOPN Inject 1 mg into the skin once a week. [employer] patient assistance 9 mL 1   XARELTO 20 MG TABS tablet Take 1 tablet by mouth once daily 90 tablet 0   No current facility-administered medications for this visit.      ___________________________________________________________________ Objective   Exam:  BP 120/60   Pulse (!) 59   Ht 5' 4" (1.626 m)   Wt 167 lb (75.8 kg)   LMP  (LMP Unknown)   BMI 28.67 kg/m  Wt Readings from Last 3 Encounters:  04/19/22 167 lb (75.8 kg)  04/06/22 168 lb (76.2 kg)  04/04/22 168 lb (76.2 kg)    General: Well-appearing Eyes: sclera anicteric, no redness ENT: oral mucosa moist without lesions, no cervical or supraclavicular lymphadenopathy CV: Regular without appreciable murmur, no JVD,  no peripheral edema Resp: clear to auscultation bilaterally, normal RR and effort noted GI: soft, no tenderness, with active bowel sounds. No guarding or palpable organomegaly noted. Skin; warm and dry, no rash or jaundice noted Neuro: awake, alert and oriented x 3. Normal gross motor function and fluent speech  Labs:     Latest Ref Rng & Units 12/14/2021   11:41 AM 07/15/2021    8:57 AM 06/23/2021   10:38 AM  CBC  WBC 4.0 - 10.5 K/uL 7.8  6.4  8.7   Hemoglobin 12.0 - 15.0 g/dL 13.9  13.5  14.6   Hematocrit 36.0 - 46.0 % 43.3  41.6  43.4   Platelets 150 - 400 K/uL 304  214  241.0       Latest Ref Rng & Units 04/04/2022   10:05 AM 12/14/2021   11:41 AM 07/15/2021    8:57 AM  CMP  Glucose 70 - 99 mg/dL 123  263  203   BUN 6 - 23 mg/dL _0 Creatinine 0.40 - 1.20 mg/dL 0.70  0.86  0.75   Sodium 135 - 145 mEq/L 139  141  139   Potassium 3.5 - 5.1 mEq/L 4.3  4.5  4.1   Chloride 96 - 112 mEq/L 104  107  102   CO2 19 - 32 mEq/L _1 Calcium 8.4 - 10.5 mg/dL 9.2  9.3  9.3   Total Protein 6.0 - 8.3 g/dL 6.8     Total Bilirubin 0.2 - 1.2 mg/dL 0.4     Alkaline Phos 39 - 117 U/L 75     AST 0 - 37 U/L 13     ALT 0 - 35 U/L 11        Radiologic Studies:  Last echocardiogram June 2022  1. Left ventricular ejection fraction, by estimation, is 65 to 70%. The  left ventricle has normal function. The left ventricle has no regional  wall motion abnormalities. There is mild left ventricular hypertrophy.  Left ventricular diastolic parameters  are consistent with Grade II diastolic dysfunction (pseudonormalization).  Elevated left atrial pressure.   2. Right ventricular systolic function is normal. The right ventricular  size is normal. Tricuspid regurgitation signal is inadequate for assessing  PA pressure.   3. Left atrial size was severely dilated.   4. Right atrial size was mildly dilated.   5. The mitral valve is abnormal. Severe mitral annular calcification. No   evidence of mitral valve regurgitation. Mild  to moderate mitral stenosis.  MG 38mHg, MVA 1.4 cm^2 by continuity equation   6. The aortic valve is tricuspid. Aortic valve regurgitation is not  visualized. Mild to moderate aortic valve sclerosis/calcification is  present, without any evidence of aortic stenosis.   7. The inferior vena cava is normal in size with greater than 50%  respiratory variability, suggesting right atrial pressure of 3 mmHg.   Assessment: Encounter Diagnoses  Name Primary?   Barrett's esophagus with high grade dysplasia Yes   Personal history of colonic polyps    Long term (current) use of antithrombotics/antiplatelets    Esophageal dysphagia     History of Barrett's with high-grade dysplasia requires endoscopic surveillance. History of colon polyps requiring surveillance colonoscopy.  Dysphagia that may be dysmotility or stricture from prior ablation.  She is also on semaglutide, which can cause upper GI dysmotility.  May need endoscopic dilation if stricture encountered.  A-fib on chronic anticoagulation.  Cardiac condition lately stable.  Hold Xarelto 24 to 48 hours before procedure, will clear with her cardiologist.  Plan:  Upper endoscopy with possible dilation as well as colonoscopy.  She was agreeable after discussion of procedure and risks.  The benefits and risks of the planned procedure were described in detail with the patient or (when appropriate) their health care proxy.  Risks were outlined as including, but not limited to, bleeding, infection, perforation, adverse medication reaction leading to cardiac or pulmonary decompensation, pancreatitis (if ERCP).  The limitation of incomplete mucosal visualization was also discussed.  No guarantees or warranties were given.   HNelida MeuseIII  CC: Referring provider noted above

## 2022-04-19 ENCOUNTER — Telehealth: Payer: Self-pay

## 2022-04-19 ENCOUNTER — Encounter: Payer: Self-pay | Admitting: Gastroenterology

## 2022-04-19 ENCOUNTER — Ambulatory Visit (INDEPENDENT_AMBULATORY_CARE_PROVIDER_SITE_OTHER): Payer: PPO | Admitting: Gastroenterology

## 2022-04-19 VITALS — BP 120/60 | HR 59 | Ht 64.0 in | Wt 167.0 lb

## 2022-04-19 DIAGNOSIS — Z86010 Personal history of colonic polyps: Secondary | ICD-10-CM | POA: Diagnosis not present

## 2022-04-19 DIAGNOSIS — K22711 Barrett's esophagus with high grade dysplasia: Secondary | ICD-10-CM

## 2022-04-19 DIAGNOSIS — R1319 Other dysphagia: Secondary | ICD-10-CM | POA: Diagnosis not present

## 2022-04-19 DIAGNOSIS — Z7902 Long term (current) use of antithrombotics/antiplatelets: Secondary | ICD-10-CM

## 2022-04-19 MED ORDER — NA SULFATE-K SULFATE-MG SULF 17.5-3.13-1.6 GM/177ML PO SOLN: 1.0000 | Freq: Once | ORAL | 0 refills | Status: AC

## 2022-04-19 NOTE — Telephone Encounter (Signed)
   Patient Name: Theresa Harrington  DOB: 1955-10-29 MRN: [id number]  Primary Cardiologist: [name], MD  Chart reviewed as part of pre-operative protocol coverage. Patient has an EGD/ colonoscopy scheduled for 06/28/2022 and we were asked to give our recommendations for holding Xarelto. She was recently seen by Dr. Marlou Porch on 12/10/30/2021 at which times she was stable from a cardiac standpoint. Per Pharmacy and office protocol, patient can hold Xarelto for 1-2 days prior to procedure. Please restart this as soon as possible afterwards.   I will route this recommendation to the requesting party via Epic fax function and remove from pre-op pool.  Please call with questions.  Darreld Mclean, PA-C 04/19/2022, 1:18 PM

## 2022-04-19 NOTE — Patient Instructions (Signed)
_______________________________________________________  If you are age 65 or older, your body mass index should be between 23-30. Your Body mass index is 28.67 kg/m. If this is out of the aforementioned range listed, please consider follow up with your Primary Care Provider.  If you are age 75 or younger, your body mass index should be between 19-25. Your Body mass index is 28.67 kg/m. If this is out of the aformentioned range listed, please consider follow up with your Primary Care Provider.   ________________________________________________________  The [place] [facility] providers would like to encourage you to use [facility] to communicate with providers for non-urgent requests or questions.  Due to long hold times on the telephone, sending your provider a message by [facility] may be a faster and more efficient way to get a response.  Please allow 48 business hours for a response.  Please remember that this is for non-urgent requests.  _______________________________________________________  Theresa Harrington have been scheduled for an endoscopy and colonoscopy. Please follow the written instructions given to you at your visit today. Please pick up your prep supplies at the pharmacy within the next 1-3 days. If you use inhalers (even only as needed), please bring them with you on the day of your procedure.  Due to recent changes in healthcare laws, you may see the results of your imaging and laboratory studies on MyChart before your provider has had a chance to review them.  We understand that in some cases there may be results that are confusing or concerning to you. Not all laboratory results come back in the same time frame and the provider may be waiting for multiple results in order to interpret others.  Please give [place] 48 hours in order for your provider to thoroughly review all the results before contacting the office for clarification of your results.   It was a pleasure to see you today!  Thank you for  trusting me with your gastrointestinal care!

## 2022-04-19 NOTE — Telephone Encounter (Signed)
Patient with diagnosis of afib on Xarelto for anticoagulation.    Procedure: EDG/Colonoscopy Date of procedure: 06/28/22  CHA2DS2-VASc Score = 5   This indicates a 7.2% annual risk of stroke. The patient's score is based upon: CHF History: 0 HTN History: 1 Diabetes History: 1 Stroke History: 0 Vascular Disease History: 1 Age Score: 1 Gender Score: 1      CrCl 80 ml/min  Per office protocol, patient can hold Xarelto for 1-2 days prior to procedure.    **This guidance is not considered finalized until pre-operative APP has relayed final recommendations.**

## 2022-04-19 NOTE — Telephone Encounter (Signed)
Pharmacy, can you please comment on how long Xarelto can be held for upcoming procedure?  Thank you! 

## 2022-04-19 NOTE — Telephone Encounter (Signed)
 [facility] Pre-operative Risk Assessment     Request for surgical clearance:     Endoscopy Procedure  What type of surgery is being performed?     EGD/Colon  When is this surgery scheduled?     06-28-2022  What type of clearance is required ?   Pharmacy  Are there any medications that need to be held prior to surgery and how long? Yes, Xarelto   Practice name and name of physician performing surgery?      [facility]  What is your office phone and fax number?      Phone- [phone]  [phone]  Anesthesia type (None, local, MAC, general) ?       MAC

## 2022-04-19 NOTE — Telephone Encounter (Signed)
Called pt and was informed to stop nexletol and zetia, start repatha injection every 2 weeks. Pt states understanding with no further questions.

## 2022-04-20 NOTE — Telephone Encounter (Signed)
Left a message to return call.  

## 2022-04-21 NOTE — Telephone Encounter (Signed)
Patient returned call

## 2022-04-21 NOTE — Telephone Encounter (Signed)
Patient has been notified and aware. No additional questions at this time.

## 2022-04-25 ENCOUNTER — Other Ambulatory Visit: Payer: Self-pay | Admitting: Internal Medicine

## 2022-04-25 DIAGNOSIS — I10 Essential (primary) hypertension: Secondary | ICD-10-CM

## 2022-05-04 DIAGNOSIS — R051 Acute cough: Secondary | ICD-10-CM | POA: Diagnosis not present

## 2022-05-04 DIAGNOSIS — J189 Pneumonia, unspecified organism: Secondary | ICD-10-CM | POA: Diagnosis not present

## 2022-05-04 DIAGNOSIS — J019 Acute sinusitis, unspecified: Secondary | ICD-10-CM | POA: Diagnosis not present

## 2022-05-04 DIAGNOSIS — R5383 Other fatigue: Secondary | ICD-10-CM | POA: Diagnosis not present

## 2022-05-08 ENCOUNTER — Ambulatory Visit: Payer: PPO | Admitting: Internal Medicine

## 2022-05-08 ENCOUNTER — Encounter: Payer: Self-pay | Admitting: Internal Medicine

## 2022-05-08 ENCOUNTER — Ambulatory Visit (INDEPENDENT_AMBULATORY_CARE_PROVIDER_SITE_OTHER): Payer: PPO | Admitting: Internal Medicine

## 2022-05-08 VITALS — BP 118/60 | HR 58 | Temp 98.0°F | Ht 64.0 in | Wt 164.0 lb

## 2022-05-08 DIAGNOSIS — E1159 Type 2 diabetes mellitus with other circulatory complications: Secondary | ICD-10-CM | POA: Diagnosis not present

## 2022-05-08 DIAGNOSIS — J189 Pneumonia, unspecified organism: Secondary | ICD-10-CM | POA: Diagnosis not present

## 2022-05-08 DIAGNOSIS — Z794 Long term (current) use of insulin: Secondary | ICD-10-CM

## 2022-05-08 DIAGNOSIS — I10 Essential (primary) hypertension: Secondary | ICD-10-CM | POA: Diagnosis not present

## 2022-05-08 DIAGNOSIS — R062 Wheezing: Secondary | ICD-10-CM | POA: Insufficient documentation

## 2022-05-08 MED ORDER — PREDNISONE 10 MG PO TABS: ORAL_TABLET | ORAL | 0 refills | Status: DC

## 2022-05-08 MED ORDER — LEVOFLOXACIN 500 MG PO TABS: 500.0000 mg | ORAL_TABLET | Freq: Every day | ORAL | 0 refills | Status: AC

## 2022-05-08 NOTE — Progress Notes (Signed)
Patient ID: Theresa Harrington, female   DOB: Sep 25, 1955, 67 y.o.   MRN: [id number]        Chief Complaint: follow up RLL CAP, wheeziness,        HPI:  Theresa Harrington is a 67 y.o. female here to f/u after seen at [facility] with CXR c/w RLL CAP on jan 4, tx with doxycyline but unfortuantely little improved and midlly worse with wheezing and sob.  Pt denies chest pain, orthopnea, PND, increased LE swelling, palpitations, dizziness or syncope.   Pt denies polydipsia, polyuria, or new focal neuro s/s.    Pt denies fever, wt loss, night sweats, loss of appetite, or other constitutional symptoms    Wt Readings from Last 3 Encounters:  05/08/22 164 lb (74.4 kg)  04/19/22 167 lb (75.8 kg)  04/06/22 168 lb (76.2 kg)   BP Readings from Last 3 Encounters:  05/08/22 118/60  04/19/22 120/60  04/06/22 (!) 150/60         Past Medical History:  Diagnosis Date   Allergy    Anxiety    Pt. denies   Arthritis    right index figer   Asymptomatic cholelithiasis    Atherosclerosis of aorta (HCC)    Cataract    Clotting disorder ([place])    Gallstones 06/2013   GERD (gastroesophageal reflux disease)    Glaucoma    "A LITTLE BIT"   History of kidney stones    lithotrispy   Hyperlipidemia    Hypertension    Hypothyroidism    no meds   Iron deficiency anemia    PAD (peripheral artery disease) (HCC)    PAF (paroxysmal atrial fibrillation) (HCC)    Peripheral arterial occlusive disease (HCC)    lower extremities   Pneumonia    PONV (postoperative nausea and vomiting)    Right ureteral stone    Type 2 diabetes mellitus ([place])    Type   Vitamin B 12 deficiency    Wears glasses    Past Surgical History:  Procedure Laterality Date   ABDOMINAL AORTOGRAM W/LOWER EXTREMITY N/A 03/09/2020   Procedure: ABDOMINAL AORTOGRAM W/LOWER EXTREMITY;  Surgeon: Serafina Mitchell, MD;  Location: [place] [facility];  Service: Cardiovascular;  Laterality: N/A;   AMPUTATION Left 07/16/2014   Procedure: LEFT SECOND TOE AMPUTATION;   Surgeon: Serafina Mitchell, MD;  Location: [place];  Service: Vascular;  Laterality: Left;  With Nerve block   ATRIAL FIBRILLATION ABLATION N/A 08/02/2021   Procedure: ATRIAL FIBRILLATION ABLATION;  Surgeon: Constance Haw, MD;  Location: [place] [facility];  Service: Cardiovascular;  Laterality: N/A;   AUGMENTATION MAMMAPLASTY     BELPHAROPTOSIS REPAIR     eyelid lift   BIOPSY  10/14/2018   Procedure: BIOPSY;  Surgeon: Rush Landmark Telford Nab., MD;  Location: [place];  Service: Gastroenterology;;   BIOPSY  08/04/2019   Procedure: BIOPSY;  Surgeon: Irving Copas., MD;  Location: [place];  Service: Gastroenterology;;   CARDIOVERSION N/A 09/19/2019   Procedure: CARDIOVERSION;  Surgeon: Buford Dresser, MD;  Location: [place];  Service: Cardiovascular;  Laterality: N/A;   COLONOSCOPY     COMBINED AUGMENTATION MAMMAPLASTY AND ABDOMINOPLASTY  2009   W/  BILATERAL  THIGH LIFT   CYSTO/  RIGHT URETERAL STENT PLACEMENT  12/30/2010   CYSTOSCOPY WITH RETROGRADE PYELOGRAM, URETEROSCOPY AND STENT PLACEMENT Right 10/07/2013   Procedure: CYSTOSCOPY WITH RETROGRADE PYELOGRAM, right URETEROSCOPY AND STENT PLACEMENT, stone extraction;  Surgeon: Arvil Persons, MD;  Location: [place];  Service: Urology;  Laterality: Right;   CYSTOSCOPY WITH STENT PLACEMENT Right 06/04/2013   Procedure: CYSTOSCOPY WITH STENT PLACEMENT;  Surgeon: Franchot Gallo, MD;  [facility];  Service: Urology;  Laterality: Right;   DILATATION & CURETTAGE/HYSTEROSCOPY WITH MYOSURE N/A 04/27/2015   Procedure: DILATATION & CURETTAGE/HYSTEROSCOPY WITH MYOSURE;  Surgeon: Terrance Mass, MD;  Location: [facility];  Service: Gynecology;  Laterality: N/A;   ENDARTERECTOMY FEMORAL Left 06/08/2014   Procedure: Left Leg Common Femoral and External Iliac  Endartarectomy with patch Angioplasty;  Surgeon: Rosetta Posner, MD;  Location: [facility];  Service: Vascular;  Laterality: Left;    ESOPHAGOGASTRODUODENOSCOPY N/A 10/14/2018   Procedure: ESOPHAGOGASTRODUODENOSCOPY (EGD);  Surgeon: Irving Copas., MD;  Location: [place];  Service: Gastroenterology;  Laterality: N/A;   ESOPHAGOGASTRODUODENOSCOPY (EGD) WITH PROPOFOL N/A 11/06/2018   Procedure: ESOPHAGOGASTRODUODENOSCOPY (EGD) WITH PROPOFOL;  Surgeon: Rush Landmark Telford Nab., MD;  [facility];  Service: Gastroenterology;  Laterality: N/A;  RFA   ESOPHAGOGASTRODUODENOSCOPY (EGD) WITH PROPOFOL N/A 02/12/2019   Procedure: ESOPHAGOGASTRODUODENOSCOPY (EGD) WITH PROPOFOL;  Surgeon: Rush Landmark Telford Nab., MD;  [facility];  Service: Gastroenterology;  Laterality: N/A;   ESOPHAGOGASTRODUODENOSCOPY (EGD) WITH PROPOFOL N/A 08/04/2019   Procedure: ESOPHAGOGASTRODUODENOSCOPY (EGD) WITH PROPOFOL;  Surgeon: Rush Landmark Telford Nab., MD;  Location: [place];  Service: Gastroenterology;  Laterality: N/A;  WITH RFA   EXTRACORPOREAL SHOCK WAVE LITHOTRIPSY Right 08-04-2013//   06-23-2013//   01-16-2011   EYE SURGERY Bilateral    cataract   FEMORAL-POPLITEAL BYPASS GRAFT Left 06/08/2014   Procedure: Left Leg Femoral -Popliteal Bypass Graft;  Surgeon: Rosetta Posner, MD;  [facility];  Service: Vascular;  Laterality: Left;   FEMORAL-POPLITEAL BYPASS GRAFT Left 06/09/2014   Procedure: Left Femoral and Popliteal Exposure; Left Femoral to Anterior Tibial Bypass Graft using Propaten 59m by 80cm Goretex Graft; Left Tibial Endarterectomy; Left Femoraland Popliteal Thrombectomy ;  Surgeon: VSerafina Mitchell MD;  [place]  Service: Vascular;  Laterality: Left;   GI RADIOFREQUENCY ABLATION N/A 11/06/2018   Procedure: GI RADIOFREQUENCY ABLATION;  Surgeon: MIrving Copas, MD;  [facility];  Service: Gastroenterology;  Laterality: N/A;   GI RADIOFREQUENCY ABLATION N/A 02/12/2019   Procedure: GI RADIOFREQUENCY ABLATION;  Surgeon: MRush LandmarkGTelford Nab, MD;  [facility];  Service:  Gastroenterology;  Laterality: N/A;   GI RADIOFREQUENCY ABLATION N/A 08/04/2019   Procedure: GI RADIOFREQUENCY ABLATION;  Surgeon: MRush LandmarkGTelford Nab, MD;  [place]  Service: Gastroenterology;  Laterality: N/A;   HOLMIUM LASER APPLICATION Right 029/[id number]/[id number]  Procedure: HOLMIUM LASER APPLICATION;  Surgeon: MArvil Persons MD;  [facility]  Service: Urology;  Laterality: Right;   KIDNEY STONE SURGERY  07/2013   1-2 stones   LAPAROSCOPIC GASTRIC BANDING  05/29/2005   LITHOTRIPSY  2-3 times   LOWER EXTREMITY ANGIOGRAM N/A 06/04/2014   Procedure: LOWER EXTREMITY ANGIOGRAM;  Surgeon: VSerafina Mitchell MD;  [facility];  Service: Cardiovascular;  Laterality: N/A;   ORIF FIFTH METACARPAL FX  RIGHT HAND  04/21/2002   POLYPECTOMY     REVISION AND RE-SITING LAP-BAND PORT  04/08/2010   W/  UPPER EGD   RIGHT KNEE PATELLECTOMY W/ REPAIR OF EXTENSOR MECHANISM  04/14/2002   TEE WITHOUT CARDIOVERSION N/A 12/24/2019   Procedure: TRANSESOPHAGEAL ECHOCARDIOGRAM (TEE);  Surgeon: TSueanne Margarita MD;  [facility];  Service: Cardiovascular;  Laterality: N/A;   Toenail removed Left 05/21/2014   2nd toenail-  Dr. PBarkley Bruns   reports  that she quit smoking about 20 years ago. Her smoking use included cigarettes. She has a 28.00 pack-year smoking history. She has never been exposed to tobacco smoke. She has never used smokeless tobacco. She reports that she does not drink alcohol and does not use drugs. family history includes Arthritis in an other family member; Cancer in her brother and another family member; Colon cancer in her maternal aunt; Colon polyps in her brother; Deep vein thrombosis in her father and sister; Diabetes in her brother, brother, brother, father, and sister; Heart disease in her brother, father, and sister; Hyperlipidemia in her brother, father, mother, and sister; Hypertension in an other family member; Kidney disease in her mother; Other  in her mother; Stroke in an other family member. Allergies  Allergen Reactions   Amlodipine Swelling   Atorvastatin Other (See Comments)    Muscle aches    Ampicillin Nausea And Vomiting and Other (See Comments)   Codeine Nausea And Vomiting    Ask patient   Lisinopril Cough        Penicillins Nausea And Vomiting    Did it involve swelling of the face/tongue/throat, SOB, or low BP? No Did it involve sudden or severe rash/hives, skin peeling, or any reaction on the inside of your mouth or nose? No Did you need to seek medical attention at a hospital or doctor's office? No When did it last happen?      20+ years If all above answers are "NO", may proceed with cephalosporin use.    Januvia [Sitagliptin] Nausea And Vomiting   Current Outpatient Medications on File Prior to Visit  Medication Sig Dispense Refill   Blood Glucose Monitoring Suppl (ONETOUCH VERIO IQ SYSTEM) w/Device KIT USE TO CHECK SUGAR DAILY 1 kit 0   cetirizine (ZYRTEC) 10 MG tablet Take 10 mg by mouth daily as needed for allergies.     Continuous Blood Gluc Transmit (DEXCOM G6 TRANSMITTER) MISC 1 Act by Does not apply route daily. 1 each 5   diltiazem (CARDIZEM) 30 MG tablet Take 1 Tablet Every 4 Hours As Needed For HR >100 And TOP BP >100 45 tablet 1   dorzolamide-timolol (COSOPT) 22.3-6.8 MG/ML ophthalmic solution Place 1 drop into the right eye 2 (two) times daily.      Empagliflozin-metFORMIN HCl ER (SYNJARDY XR) 25-1000 MG TB24 Take 1 tablet by mouth daily. 90 tablet 1   Glucagon (GVOKE HYPOPEN 2-PACK) 1 MG/0.2ML SOAJ Inject 1 Act into the skin daily as needed. 2 mL 5   hydrochlorothiazide (HYDRODIURIL) 25 MG tablet Take 0.5 tablets (12.5 mg total) by mouth daily. 45 tablet 3   Insulin Glargine (BASAGLAR KWIKPEN) 100 UNIT/ML Inject 50 Units into the skin at bedtime. [place] patient assistance (Patient taking differently: Inject 40-42 Units into the skin at bedtime. [place] patient assistance taking 46  units) 30 mL 2   insulin lispro (HUMALOG) 100 UNIT/ML injection Inject 10-14 Units into the skin 3 (three) times daily before meals. [place] patient assistance Sliding scale     Insulin Pen Needle (PEN NEEDLES) 32G X 4 MM MISC Inject 1 pen  as directed 4 (four) times daily. Use to inject levemir or novolog as directed. 400 each 3   latanoprost (XALATAN) 0.005 % ophthalmic solution Place 1 drop into both eyes at bedtime.      losartan (COZAAR) 100 MG tablet TAKE ONE TABLET BY MOUTH EVERYDAY AT BEDTIME 90 tablet 1   magnesium oxide (MAG-OX) 400 (240 Mg) MG  tablet TAKE ONE TABLET BY MOUTH EVERYDAY AT BEDTIME 90 tablet 1   nebivolol (BYSTOLIC) 10 MG tablet Take 1 tablet (10 mg total) by mouth daily. 90 tablet 3   omeprazole (PRILOSEC) 40 MG capsule TAKE ONE CAPSULE BY MOUTH EVERY MORNING and TAKE ONE CAPSULE BY MOUTH EVERY EVENING 180 capsule 1   ONETOUCH VERIO test strip USE TO check blood glucose TWICE DAILY 200 strip 3   pravastatin (PRAVACHOL) 40 MG tablet TAKE ONE TABLET BY MOUTH EVERYDAY AT BEDTIME 90 tablet 1   Semaglutide, 1 MG/DOSE, (OZEMPIC, 1 MG/DOSE,) 4 MG/3ML SOPN Inject 1 mg into the skin once a week. [employer] patient assistance 9 mL 1   XARELTO 20 MG TABS tablet Take 1 tablet by mouth once daily 90 tablet 0   Continuous Blood Gluc Receiver (DEXCOM G6 RECEIVER) DEVI 1 Act by Does not apply route daily. (Patient not taking: Reported on 05/08/2022) 1 each 5   Continuous Blood Gluc Sensor (DEXCOM G6 SENSOR) MISC 1 Act by Does not apply route daily. (Patient not taking: Reported on 05/08/2022) 1 each 5   Evolocumab (REPATHA) 140 MG/ML SOSY Inject 1 Act into the skin every 14 (fourteen) days. (Patient not taking: Reported on 05/08/2022) 6.3 mL 1   HUMALOG 100 UNIT/ML cartridge DIAL AND INJECT 14-20 UNITS UNDER THE SKIN 3 (THREE) TIMES DAILY BEFORE MEALS AS DIRECTED. (Patient not taking: Reported on 05/08/2022) 75 mL 0   No current facility-administered medications on file prior to visit.         ROS:  All others reviewed and negative.  Objective        PE:  BP 118/60 (BP Location: Right Arm, Patient Position: Sitting, Cuff Size: Large)   Pulse (!) 58   Temp 98 F (36.7 C) (Oral)   Ht '5\' 4"'$  (1.626 m)   Wt 164 lb (74.4 kg)   LMP  (LMP Unknown)   SpO2 95%   BMI 28.15 kg/m                 Constitutional: Pt appears in NAD,, mild ill               HENT: Head: NCAT.                Right Ear: External ear normal.                 Left Ear: External ear normal.  Bilat tm's with mild erythema.  Max sinus areas non tender.  Pharynx with mild erythema, no exudate               Eyes: . Pupils are equal, round, and reactive to light. Conjunctivae and EOM are normal               Nose: without d/c or deformity               Neck: Neck supple. Gross normal ROM               Cardiovascular: Normal rate and regular rhythm.                 Pulmonary/Chest: Effort normal and breath sounds decreased without rales but with few bilat LL wheezing.                               Neurological: Pt is alert. At baseline orientation, motor grossly intact  Skin: Skin is warm. No rashes, no other new lesions, LE edema - none               Psychiatric: Pt behavior is normal without agitation   Micro: none  Cardiac tracings I have personally interpreted today:  none  Pertinent Radiological findings (summarize): none   Lab Results  Component Value Date   WBC 7.8 12/14/2021   HGB 13.9 12/14/2021   HCT 43.3 12/14/2021   PLT 304 12/14/2021   GLUCOSE 123 (H) 04/04/2022   CHOL 223 (H) 04/04/2022   TRIG 192.0 (H) 04/04/2022   HDL 53.40 04/04/2022   LDLDIRECT 160.2 03/01/2010   LDLCALC 131 (H) 04/04/2022   ALT 11 04/04/2022   AST 13 04/04/2022   NA 139 04/04/2022   K 4.3 04/04/2022   CL 104 04/04/2022   CREATININE 0.70 04/04/2022   BUN 11 04/04/2022   CO2 28 04/04/2022   TSH 0.51 06/23/2021   INR 1.3 (H) 03/08/2020   HGBA1C 8.5 (H) 04/04/2022   MICROALBUR 1.5 04/04/2022    Assessment/Plan:  Theresa Harrington is a 67 y.o. Black or African American [2] female with  has a past medical history of Allergy, Anxiety, Arthritis, Asymptomatic cholelithiasis, Atherosclerosis of aorta ([place]), Cataract, Clotting disorder ([place]), Gallstones (06/2013), GERD (gastroesophageal reflux disease), Glaucoma, History of kidney stones, Hyperlipidemia, Hypertension, Hypothyroidism, Iron deficiency anemia, PAD (peripheral artery disease) ([place]), PAF (paroxysmal atrial fibrillation) ([place]), Peripheral arterial occlusive disease ([place]), Pneumonia, PONV (postoperative nausea and vomiting), Right ureteral stone, Type 2 diabetes mellitus ([place]), Vitamin B 12 deficiency, and Wears glasses.  Right lower lobe pneumonia Recent onset jan 4 , little improved, declines f/u cxr, for change doxy to levaquin 500 mg qd, and cough med prn  Wheezing Mild to mod, for prednisone taper, to f/u any worsening symptoms or concerns  Diabetes mellitus ([place]) Lab Results  Component Value Date   HGBA1C 8.5 (H) 04/04/2022   Uncontrolled, pt to continue current increased medical treatment synjardy xr 25-1000 qd, glargine 50 u qhs, humalog tid prn   Essential hypertension, benign BP Readings from Last 3 Encounters:  05/08/22 118/60  04/19/22 120/60  04/06/22 (!) 150/60   Stable, pt to continue medical treatment hct 25 qd, losartan 517 qd, bystolic 10 mg qd  Followup: Return if symptoms worsen or fail to improve.  Cathlean Cower, MD 05/08/2022 9:31 PM [facility]

## 2022-05-08 NOTE — Assessment & Plan Note (Signed)
Recent onset jan 4 , little improved, declines f/u cxr, for change doxy to levaquin 500 mg qd, and cough med prn

## 2022-05-08 NOTE — Assessment & Plan Note (Signed)
Lab Results  Component Value Date   HGBA1C 8.5 (H) 04/04/2022   Uncontrolled, pt to continue current increased medical treatment synjardy xr 25-1000 qd, glargine 50 u qhs, humalog tid prn

## 2022-05-08 NOTE — Assessment & Plan Note (Signed)
Mild to mod, for prednisone taper,  to f/u any worsening symptoms or concerns 

## 2022-05-08 NOTE — Assessment & Plan Note (Signed)
BP Readings from Last 3 Encounters:  05/08/22 118/60  04/19/22 120/60  04/06/22 (!) 150/60   Stable, pt to continue medical treatment hct 25 qd, losartan 893 qd, bystolic 10 mg qd

## 2022-05-08 NOTE — Patient Instructions (Signed)
Ok to change the doxycycline to levaquin 500 mg per day  Please take all new medication as prescribed - also the prednisone  Please call if you need further cough medicine  Please continue all other medications as before, and refills have been done if requested.  Please have the pharmacy call with any other refills you may need.  Please keep your appointments with your specialists as you may have planned

## 2022-05-09 ENCOUNTER — Other Ambulatory Visit: Payer: Self-pay

## 2022-05-09 DIAGNOSIS — Z89422 Acquired absence of other left toe(s): Secondary | ICD-10-CM | POA: Diagnosis not present

## 2022-05-09 DIAGNOSIS — Z8631 Personal history of diabetic foot ulcer: Secondary | ICD-10-CM | POA: Diagnosis not present

## 2022-05-09 DIAGNOSIS — E1151 Type 2 diabetes mellitus with diabetic peripheral angiopathy without gangrene: Secondary | ICD-10-CM | POA: Diagnosis not present

## 2022-05-09 DIAGNOSIS — L89891 Pressure ulcer of other site, stage 1: Secondary | ICD-10-CM | POA: Diagnosis not present

## 2022-05-10 ENCOUNTER — Other Ambulatory Visit: Payer: Self-pay | Admitting: Internal Medicine

## 2022-05-10 DIAGNOSIS — Z794 Long term (current) use of insulin: Secondary | ICD-10-CM

## 2022-05-10 MED ORDER — INSULIN LISPRO (1 UNIT DIAL) 100 UNIT/ML (KWIKPEN): 14.0000 [IU] | PEN_INJECTOR | Freq: Three times a day (TID) | SUBCUTANEOUS | 1 refills | Status: DC

## 2022-05-18 ENCOUNTER — Ambulatory Visit (INDEPENDENT_AMBULATORY_CARE_PROVIDER_SITE_OTHER): Payer: PPO

## 2022-05-18 ENCOUNTER — Encounter: Payer: Self-pay | Admitting: Internal Medicine

## 2022-05-18 ENCOUNTER — Ambulatory Visit (INDEPENDENT_AMBULATORY_CARE_PROVIDER_SITE_OTHER): Payer: PPO | Admitting: Internal Medicine

## 2022-05-18 VITALS — BP 124/76 | HR 72 | Temp 98.2°F | Resp 16 | Ht 64.0 in | Wt 162.0 lb

## 2022-05-18 DIAGNOSIS — Z794 Long term (current) use of insulin: Secondary | ICD-10-CM

## 2022-05-18 DIAGNOSIS — I4891 Unspecified atrial fibrillation: Secondary | ICD-10-CM | POA: Diagnosis not present

## 2022-05-18 DIAGNOSIS — F332 Major depressive disorder, recurrent severe without psychotic features: Secondary | ICD-10-CM | POA: Diagnosis not present

## 2022-05-18 DIAGNOSIS — R059 Cough, unspecified: Secondary | ICD-10-CM | POA: Diagnosis not present

## 2022-05-18 DIAGNOSIS — R052 Subacute cough: Secondary | ICD-10-CM

## 2022-05-18 DIAGNOSIS — E1159 Type 2 diabetes mellitus with other circulatory complications: Secondary | ICD-10-CM

## 2022-05-18 DIAGNOSIS — J189 Pneumonia, unspecified organism: Secondary | ICD-10-CM

## 2022-05-18 DIAGNOSIS — J9811 Atelectasis: Secondary | ICD-10-CM | POA: Diagnosis not present

## 2022-05-18 MED ORDER — DULOXETINE HCL 30 MG PO CPEP: 30.0000 mg | ORAL_CAPSULE | Freq: Every day | ORAL | 0 refills | Status: DC

## 2022-05-18 MED ORDER — NUZYRA 150 MG PO TABS: 300.0000 mg | ORAL_TABLET | Freq: Two times a day (BID) | ORAL | 0 refills | Status: AC

## 2022-05-18 MED ORDER — DILTIAZEM HCL ER COATED BEADS 120 MG PO CP24: 120.0000 mg | ORAL_CAPSULE | Freq: Every day | ORAL | 0 refills | Status: DC

## 2022-05-18 MED ORDER — ONDANSETRON HCL 4 MG PO TABS: 4.0000 mg | ORAL_TABLET | Freq: Three times a day (TID) | ORAL | 0 refills | Status: DC | PRN

## 2022-05-18 MED ORDER — HYDROCOD POLI-CHLORPHE POLI ER 10-8 MG/5ML PO SUER: 5.0000 mL | Freq: Two times a day (BID) | ORAL | 0 refills | Status: DC | PRN

## 2022-05-18 MED ORDER — NUZYRA 150 MG PO TABS: 300.0000 mg | ORAL_TABLET | Freq: Every day | ORAL | 0 refills | Status: DC

## 2022-05-18 NOTE — Progress Notes (Signed)
Subjective:  Patient ID: Theresa Harrington, female    DOB: [birth year]  Age: 67 y.o. MRN: [id number]  CC: Cough   HPI Theresa Harrington presents for f/up  She continues to c/o cough productive of yellow phlegm.  She is completing a course of prednisone and Levaquin.  She is not taking the ARB or thiazide diuretic.  Outpatient Medications Prior to Visit  Medication Sig Dispense Refill   Blood Glucose Monitoring Suppl (ONETOUCH VERIO IQ SYSTEM) w/Device KIT USE TO CHECK SUGAR DAILY 1 kit 0   cetirizine (ZYRTEC) 10 MG tablet Take 10 mg by mouth daily as needed for allergies.     Continuous Blood Gluc Receiver (DEXCOM G6 RECEIVER) DEVI 1 Act by Does not apply route daily. 1 each 5   Continuous Blood Gluc Sensor (DEXCOM G6 SENSOR) MISC 1 Act by Does not apply route daily. 1 each 5   Continuous Blood Gluc Transmit (DEXCOM G6 TRANSMITTER) MISC 1 Act by Does not apply route daily. 1 each 5   dorzolamide-timolol (COSOPT) 22.3-6.8 MG/ML ophthalmic solution Place 1 drop into the right eye 2 (two) times daily.      Empagliflozin-metFORMIN HCl ER (SYNJARDY XR) 25-1000 MG TB24 Take 1 tablet by mouth daily. 90 tablet 1   Evolocumab (REPATHA) 140 MG/ML SOSY Inject 1 Act into the skin every 14 (fourteen) days. 6.3 mL 1   Glucagon (GVOKE HYPOPEN 2-PACK) 1 MG/0.2ML SOAJ Inject 1 Act into the skin daily as needed. 2 mL 5   Insulin Glargine (BASAGLAR KWIKPEN) 100 UNIT/ML Inject 50 Units into the skin at bedtime. [place] patient assistance (Patient taking differently: Inject 40-42 Units into the skin at bedtime. [place] patient assistance taking 46 units) 30 mL 2   insulin lispro (HUMALOG KWIKPEN) 100 UNIT/ML KwikPen Inject 14 Units into the skin 3 (three) times daily. 45 mL 1   Insulin Pen Needle (PEN NEEDLES) 32G X 4 MM MISC Inject 1 pen  as directed 4 (four) times daily. Use to inject levemir or novolog as directed. 400 each 3   latanoprost (XALATAN) 0.005 % ophthalmic solution Place 1 drop into both eyes  at bedtime.      levofloxacin (LEVAQUIN) 500 MG tablet Take 1 tablet (500 mg total) by mouth daily for 10 days. 10 tablet 0   magnesium oxide (MAG-OX) 400 (240 Mg) MG tablet TAKE ONE TABLET BY MOUTH EVERYDAY AT BEDTIME 90 tablet 1   nebivolol (BYSTOLIC) 10 MG tablet Take 1 tablet (10 mg total) by mouth daily. 90 tablet 3   omeprazole (PRILOSEC) 40 MG capsule TAKE ONE CAPSULE BY MOUTH EVERY MORNING and TAKE ONE CAPSULE BY MOUTH EVERY EVENING 180 capsule 1   ONETOUCH VERIO test strip USE TO check blood glucose TWICE DAILY 200 strip 3   pravastatin (PRAVACHOL) 40 MG tablet TAKE ONE TABLET BY MOUTH EVERYDAY AT BEDTIME 90 tablet 1   predniSONE (DELTASONE) 10 MG tablet 3 tabs by mouth per day for 3 days,2tabs per day for 3 days,1tab per day for 3 days 18 tablet 0   Semaglutide, 1 MG/DOSE, (OZEMPIC, 1 MG/DOSE,) 4 MG/3ML SOPN Inject 1 mg into the skin once a week. [employer] patient assistance 9 mL 1   XARELTO 20 MG TABS tablet Take 1 tablet by mouth once daily 90 tablet 0   diltiazem (CARDIZEM) 30 MG tablet Take 1 Tablet Every 4 Hours As Needed For HR >100 And TOP BP >100 45 tablet 1   hydrochlorothiazide (HYDRODIURIL) 25 MG tablet  Take 0.5 tablets (12.5 mg total) by mouth daily. 45 tablet 3   losartan (COZAAR) 100 MG tablet TAKE ONE TABLET BY MOUTH EVERYDAY AT BEDTIME 90 tablet 1   No facility-administered medications prior to visit.    ROS Review of Systems  Constitutional: Negative.  Negative for chills, diaphoresis, fatigue and fever.  HENT: Negative.    Eyes: Negative.   Respiratory:  Positive for cough. Negative for chest tightness, shortness of breath and wheezing.   Cardiovascular:  Negative for chest pain, palpitations and leg swelling.  Gastrointestinal:  Negative for abdominal pain, diarrhea and nausea.  Musculoskeletal: Negative.  Negative for back pain and myalgias.  Skin: Negative.  Negative for rash.  Neurological:  Negative for dizziness and weakness.  Hematological:   Negative for adenopathy. Does not bruise/bleed easily.  Psychiatric/Behavioral:  Positive for dysphoric mood. Negative for confusion, decreased concentration, sleep disturbance and suicidal ideas. The patient is nervous/anxious.     Objective:  BP 124/76 (BP Location: Left Arm, Patient Position: Sitting, Cuff Size: Large)   Pulse 72   Temp 98.2 F (36.8 C) (Oral)   Resp 16   Ht '5\' 4"'$  (1.626 m)   Wt 162 lb (73.5 kg)   LMP  (LMP Unknown)   SpO2 97%   BMI 27.81 kg/m   BP Readings from Last 3 Encounters:  05/18/22 124/76  05/08/22 118/60  04/19/22 120/60    Wt Readings from Last 3 Encounters:  05/18/22 162 lb (73.5 kg)  05/08/22 164 lb (74.4 kg)  04/19/22 167 lb (75.8 kg)    Physical Exam Vitals reviewed.  Constitutional:      General: She is not in acute distress.    Appearance: She is not ill-appearing, toxic-appearing or diaphoretic.  HENT:     Mouth/Throat:     Mouth: Mucous membranes are moist.  Eyes:     General: No scleral icterus.    Conjunctiva/sclera: Conjunctivae normal.  Cardiovascular:     Rate and Rhythm: Tachycardia present. Rhythm irregularly irregular.     Heart sounds: Normal heart sounds, S1 normal and S2 normal. No murmur heard.    Comments: EKG - A fib with RVR, 135 bpm No LVH, Q waves, or ST/T wave changes  Pulmonary:     Effort: Pulmonary effort is normal. No tachypnea or respiratory distress.     Breath sounds: No stridor. Examination of the right-middle field reveals rhonchi and rales. Examination of the right-lower field reveals rhonchi and rales. Rhonchi and rales present. No decreased breath sounds or wheezing.  Chest:     Chest wall: No tenderness.  Abdominal:     General: Abdomen is flat.     Palpations: There is no mass.     Tenderness: There is no abdominal tenderness. There is no guarding.     Hernia: No hernia is present.  Musculoskeletal:     Cervical back: Neck supple.     Right lower leg: No edema.     Left lower leg: No  edema.  Lymphadenopathy:     Cervical: No cervical adenopathy.  Skin:    General: Skin is warm and dry.  Neurological:     General: No focal deficit present.     Mental Status: She is alert.  Psychiatric:        Attention and Perception: Attention and perception normal.        Mood and Affect: Mood is anxious and depressed. Affect is tearful. Affect is not blunt or angry.  Behavior: Behavior normal.        Thought Content: Thought content normal. Thought content is not paranoid or delusional. Thought content does not include homicidal or suicidal ideation.        Cognition and Memory: Cognition normal.        Judgment: Judgment normal.     Lab Results  Component Value Date   WBC 7.8 12/14/2021   HGB 13.9 12/14/2021   HCT 43.3 12/14/2021   PLT 304 12/14/2021   GLUCOSE 123 (H) 04/04/2022   CHOL 223 (H) 04/04/2022   TRIG 192.0 (H) 04/04/2022   HDL 53.40 04/04/2022   LDLDIRECT 160.2 03/01/2010   LDLCALC 131 (H) 04/04/2022   ALT 11 04/04/2022   AST 13 04/04/2022   NA 139 04/04/2022   K 4.3 04/04/2022   CL 104 04/04/2022   CREATININE 0.70 04/04/2022   BUN 11 04/04/2022   CO2 28 04/04/2022   TSH 0.51 06/23/2021   INR 1.3 (H) 03/08/2020   HGBA1C 8.5 (H) 04/04/2022   MICROALBUR 1.5 04/04/2022    VAS [place] ABI WITH/WO TBI  Result Date: 03/13/2022  LOWER EXTREMITY DOPPLER STUDY Patient Name:  JOELEE SNOKE  Date of Exam:   03/13/2022 Medical Rec #: [id number]   Accession #:    [id number] Date of Birth: 05-12-1955  Patient Gender: F Patient Age:   73 years Exam Location:  [facility] Procedure:      VAS [place] ABI WITH/WO TBI Referring Phys: Harold Barban --------------------------------------------------------------------------------  Indications: Peripheral artery disease. High Risk Factors: Hypertension, hyperlipidemia..Atrial Fibrillation, diabetic Other Factors:  Vascular Interventions: Hx occluded left Fem Pop bypass graft.                         Left ATA  endarterectomy. Left pop thrombectomy. Left                         CFA, EIA angioplasty. 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                          Right EIA 50-99% stenosis. Performing Technologist: Alvia Grove RVT  Examination Guidelines: A complete evaluation includes at minimum, Doppler waveform signals and systolic blood pressure reading at the level of bilateral brachial, anterior tibial, and posterior tibial arteries, when vessel segments are accessible. Bilateral testing is considered an integral part of a complete examination. Photoelectric Plethysmograph (PPG) waveforms and toe systolic pressure readings are included as required and additional duplex testing as needed. Limited examinations for reoccurring indications may be performed as noted.  ABI Findings: +---------+------------------+-----+----------+--------+ Right    Rt Pressure (mmHg)IndexWaveform  Comment  +---------+------------------+-----+----------+--------+ Brachial 213                                       +---------+------------------+-----+----------+--------+ PTA      135               0.63 monophasic         +---------+------------------+-----+----------+--------+ DP       97                0.45 monophasic         +---------+------------------+-----+----------+--------+ Great Toe76                0.36 Abnormal           +---------+------------------+-----+----------+--------+ +---------+------------------+-----+----------+-------+  Left     Lt Pressure (mmHg)IndexWaveform  Comment +---------+------------------+-----+----------+-------+ Brachial 214                                      +---------+------------------+-----+----------+-------+ PTA      177               0.83 monophasic        +---------+------------------+-----+----------+-------+ DP       162               0.76 monophasic        +---------+------------------+-----+----------+-------+ Great Toe79                0.37  Abnormal          +---------+------------------+-----+----------+-------+ +-------+-----------+-----------+------------+------------+ ABI/TBIToday's ABIToday's TBIPrevious ABIPrevious TBI +-------+-----------+-----------+------------+------------+ Right  0.63       0.36       0.63        0.20         +-------+-----------+-----------+------------+------------+ Left   0.83       0.37       0.73        0.36         +-------+-----------+-----------+------------+------------+  Right ABIs appear essentially unchanged. Left ABI appears increased , may be falsely elevated.  Summary: Right: Resting right ankle-brachial index indicates moderate right lower extremity arterial disease. The right toe-brachial index is abnormal. Left: Resting left ankle-brachial index indicates mild left lower extremity arterial disease, may be falsely elevated due to calcified arteries.. The left toe-brachial index is abnormal. *See table(s) above for measurements and observations.  Electronically signed by Harold Barban MD on 03/13/2022 at 11:43:40 AM.    Final     DG Chest 2 View  Result Date: 05/18/2022 CLINICAL DATA:  Cough and congestion EXAM: CHEST - 2 VIEW COMPARISON:  None Available. FINDINGS: Normal cardiac silhouette. Band of linear atelectasis in the RIGHT lower lobe. Normal pulmonary vasculature. No evidence of effusion, infiltrate, or pneumothorax. No acute bony abnormality. IMPRESSION: 1. No evidence pneumonia. 2. New atelectasis in the RIGHT lower lobe. Electronically Signed   By: Suzy Bouchard M.D.   On: 05/18/2022 16:26   VAS [place] ABI WITH/WO TBI  Result Date: 03/13/2022  LOWER EXTREMITY DOPPLER STUDY Patient Name:  MADYSUN THALL  Date of Exam:   03/13/2022 Medical Rec #: [id number]   Accession #:    [id number] Date of Birth: 02-Oct-1955  Patient Gender: F Patient Age:   81 years Exam Location:  [facility] Procedure:      VAS [place] ABI WITH/WO TBI Referring Phys: Harold Barban  --------------------------------------------------------------------------------  Indications: Peripheral artery disease. High Risk Factors: Hypertension, hyperlipidemia..Atrial Fibrillation, diabetic Other Factors:  Vascular Interventions: Hx occluded left Fem Pop bypass graft.                         Left ATA endarterectomy. Left pop thrombectomy. Left                         CFA, EIA angioplasty. 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                          Right EIA 50-99% stenosis. Performing Technologist: Alvia Grove RVT  Examination Guidelines: A complete evaluation includes at minimum, Doppler waveform signals and systolic blood pressure  reading at the level of bilateral brachial, anterior tibial, and posterior tibial arteries, when vessel segments are accessible. Bilateral testing is considered an integral part of a complete examination. Photoelectric Plethysmograph (PPG) waveforms and toe systolic pressure readings are included as required and additional duplex testing as needed. Limited examinations for reoccurring indications may be performed as noted.  ABI Findings: +---------+------------------+-----+----------+--------+ Right    Rt Pressure (mmHg)IndexWaveform  Comment  +---------+------------------+-----+----------+--------+ Brachial 213                                       +---------+------------------+-----+----------+--------+ PTA      135               0.63 monophasic         +---------+------------------+-----+----------+--------+ DP       97                0.45 monophasic         +---------+------------------+-----+----------+--------+ Great Toe76                0.36 Abnormal           +---------+------------------+-----+----------+--------+ +---------+------------------+-----+----------+-------+ Left     Lt Pressure (mmHg)IndexWaveform  Comment +---------+------------------+-----+----------+-------+ Brachial 214                                       +---------+------------------+-----+----------+-------+ PTA      177               0.83 monophasic        +---------+------------------+-----+----------+-------+ DP       162               0.76 monophasic        +---------+------------------+-----+----------+-------+ Great Toe79                0.37 Abnormal          +---------+------------------+-----+----------+-------+ +-------+-----------+-----------+------------+------------+ ABI/TBIToday's ABIToday's TBIPrevious ABIPrevious TBI +-------+-----------+-----------+------------+------------+ Right  0.63       0.36       0.63        0.20         +-------+-----------+-----------+------------+------------+ Left   0.83       0.37       0.73        0.36         +-------+-----------+-----------+------------+------------+  Right ABIs appear essentially unchanged. Left ABI appears increased , may be falsely elevated.  Summary: Right: Resting right ankle-brachial index indicates moderate right lower extremity arterial disease. The right toe-brachial index is abnormal. Left: Resting left ankle-brachial index indicates mild left lower extremity arterial disease, may be falsely elevated due to calcified arteries.. The left toe-brachial index is abnormal. *See table(s) above for measurements and observations.  Electronically signed by Harold Barban MD on 03/13/2022 at 11:43:40 AM.    Final      DG Chest 2 View  Result Date: 05/18/2022 CLINICAL DATA:  Cough and congestion EXAM: CHEST - 2 VIEW COMPARISON:  None Available. FINDINGS: Normal cardiac silhouette. Band of linear atelectasis in the RIGHT lower lobe. Normal pulmonary vasculature. No evidence of effusion, infiltrate, or pneumothorax. No acute bony abnormality. IMPRESSION: 1. No evidence pneumonia. 2. New atelectasis in the RIGHT lower lobe. Electronically Signed   By: Suzy Bouchard M.D.   On: 05/18/2022 16:26  Assessment & Plan:   Wendi was seen today for cough.  Diagnoses  and all orders for this visit:  Subacute cough- Based on her symptoms and exam and abnormal chest x-ray I think she has a pneumonia that is resistant to the fluoroquinolone.  Will treat NUZYRA. -     DG Chest 2 View; Future  Atrial fibrillation with RVR ([place])- Will increase the dose of [place] diltiazem. -     EKG 12-Lead -     diltiazem (CARDIZEM CD) 120 MG 24 hr capsule; Take 1 capsule (120 mg total) by mouth daily. -     Ambulatory referral to Cardiology  Pneumonia of right lower lobe due to infectious organism -     Omadacycline Tosylate (NUZYRA) 150 MG TABS; Take 2 tablets (300 mg total) by mouth 2 (two) times daily for 1 day. -     Omadacycline Tosylate (NUZYRA) 150 MG TABS; Take 2 tablets (300 mg total) by mouth daily for 7 days. -     chlorpheniramine-HYDROcodone (TUSSIONEX) 10-8 MG/5ML; Take 5 mLs by mouth every 12 (twelve) hours as needed for cough. -     ondansetron (ZOFRAN) 4 MG tablet; Take 1 tablet (4 mg total) by mouth every 8 (eight) hours as needed for nausea or vomiting.  Severe episode of recurrent major depressive disorder, without psychotic features ([place]) -     DULoxetine (CYMBALTA) 30 MG capsule; Take 1 capsule (30 mg total) by mouth daily.  Type 2 diabetes mellitus with other circulatory complication, with long-term current use of insulin ([place]) -     HM Diabetes Foot Exam   I have discontinued Purvi Clyatt's diltiazem, hydrochlorothiazide, and losartan. I am also having her start on diltiazem, Nuzyra, Nuzyra, chlorpheniramine-HYDROcodone, ondansetron, and DULoxetine. Additionally, I am having her maintain her latanoprost, OneTouch Verio IQ System, dorzolamide-timolol, cetirizine, Basaglar KwikPen, [place] HypoPen 2-Pack, nebivolol, Pen Needles, pravastatin, magnesium oxide, omeprazole, Xarelto, Ozempic (1 MG/DOSE), Dexcom G6 Receiver, Dexcom G6 Transmitter, Dexcom G6 Sensor, Synjardy XR, Repatha, OneTouch Verio, predniSONE, and insulin lispro.  Meds ordered this encounter   Medications   diltiazem (CARDIZEM CD) 120 MG 24 hr capsule    Sig: Take 1 capsule (120 mg total) by mouth daily.    Dispense:  90 capsule    Refill:  0   Omadacycline Tosylate (NUZYRA) 150 MG TABS    Sig: Take 2 tablets (300 mg total) by mouth 2 (two) times daily for 1 day.    Dispense:  4 tablet    Refill:  0   Omadacycline Tosylate (NUZYRA) 150 MG TABS    Sig: Take 2 tablets (300 mg total) by mouth daily for 7 days.    Dispense:  14 tablet    Refill:  0   chlorpheniramine-HYDROcodone (TUSSIONEX) 10-8 MG/5ML    Sig: Take 5 mLs by mouth every 12 (twelve) hours as needed for cough.    Dispense:  115 mL    Refill:  0   ondansetron (ZOFRAN) 4 MG tablet    Sig: Take 1 tablet (4 mg total) by mouth every 8 (eight) hours as needed for nausea or vomiting.    Dispense:  35 tablet    Refill:  0   DULoxetine (CYMBALTA) 30 MG capsule    Sig: Take 1 capsule (30 mg total) by mouth daily.    Dispense:  30 capsule    Refill:  0     Follow-up: Return in about 3 weeks (around 06/08/2022).  Scarlette Calico, MD

## 2022-05-18 NOTE — Patient Instructions (Addendum)
NUZYRA -  On an empty stomach 2 tabs tonight and 2 tabs tomorrow. Then 2 tabs daily for 7 more days   Community-Acquired Pneumonia, Adult Pneumonia is an infection of the lungs. It causes irritation and swelling in the airways of the lungs. Mucus and fluid may also build up inside the airways. This may cause coughing and trouble breathing. One type of pneumonia can happen while you are in a hospital. A different type can happen when you are not in a hospital (community-acquired pneumonia). What are the causes?  This condition is caused by germs (viruses, bacteria, or fungi). Some types of germs can spread from person to person. Pneumonia is not thought to spread from person to person. What increases the risk? You have a long-term (chronic) disease, such as: Disease of the lungs. This may be chronic obstructive pulmonary disease (COPD) or asthma. Heart failure. Cystic fibrosis. Diabetes. Kidney disease. Sickle cell disease. HIV. You have other health problems, such as: Your body's defense system (immune system) is weak. A condition that may cause you to breathe in fluids from your mouth and nose. You had your spleen taken out. You do not take good care of your teeth and mouth (poor dental hygiene). You use or have used tobacco products. You go where the germs that cause this illness are common. You are older than 66 years of age. What are the signs or symptoms? A cough. A fever. Sweating or chills. Chest pain, often when you breathe deeply or cough. Breathing problems, such as: Fast breathing. Trouble breathing. Shortness of breath. Feeling tired (fatigued). Muscle aches. How is this treated? Treatment for this condition depends on many things, such as: The cause of your illness. Your medicines. Your other health problems. Most adults can be treated at home. Sometimes, treatment must happen in a hospital. Treatment may include medicines to kill germs. Medicines may  depend on which germ caused your illness. Very bad pneumonia is rare. If you get it, you may: Have a machine to help you breathe. Have fluid taken away from around your lungs. Follow these instructions at home: Medicines Take over-the-counter and prescription medicines only as told by your doctor. Take cough medicine only if you are losing sleep. Cough medicine can keep your body from taking mucus away from your lungs. If you were prescribed antibiotics, take them as told by your doctor. Do not stop taking them even if you start to feel better. Lifestyle     Do not smoke or use any products that contain nicotine or tobacco. If you need help quitting, ask your doctor. Do not drink alcohol. Eat a healthy diet. This includes a lot of vegetables, fruits, whole grains, low-fat dairy products, and low-fat (lean) protein. General instructions  Rest a lot. Sleep for at least 8 hours each night. Sleep with your head and neck raised. Put a few pillows under your head or sleep in a reclining chair. Return to your normal activities as told by your doctor. Ask your doctor what activities are safe for you. Drink enough fluid to keep your pee (urine) pale yellow. If your throat is sore, gargle with a mixture of salt and water 3-4 times a day or as needed. To make salt water, completely dissolve -1 tsp (3-6 g) of salt in 1 cup (237 mL) of warm water. Keep all follow-up visits. How is this prevented? Getting the pneumonia shot (vaccine). These shots have different types and schedules. Ask your doctor what works best for you. Think  about getting this shot if: You are older than 66 years of age. You are 50-43 years of age and: You are being treated for cancer. You have long-term lung disease. You have other problems that affect your body's defense system. Ask your doctor if you have one of these. Getting your flu shot every year. Ask your doctor which type of shot is best for you. Going to the dentist  as often as told. Washing your hands often with soap and water for at least 20 seconds. If you cannot use soap and water, use hand sanitizer. Contact a doctor if: You have a fever. You lose sleep because your cough medicine does not help. Get help right away if: You are short of breath and this gets worse. You have more chest pain. Your sickness gets worse. This is very serious if: You are an older adult. Your body's defense system is weak. You cough up blood. These symptoms may be an emergency. Get help right away. Call 911. Do not wait to see if the symptoms will go away. Do not drive yourself to the hospital. Summary Pneumonia is an infection of the lungs. Community-acquired pneumonia affects people who have not been in the hospital. Certain germs can cause this infection. This condition may be treated with medicines that kill germs. For very bad pneumonia, you may need a hospital stay and treatment to help with breathing. This information is not intended to replace advice given to you by your health care provider. Make sure you discuss any questions you have with your health care provider. Document Revised: 06/15/2021 Document Reviewed: 06/15/2021 Elsevier Patient Education  [place].

## 2022-05-19 ENCOUNTER — Telehealth: Payer: Self-pay | Admitting: Internal Medicine

## 2022-05-19 ENCOUNTER — Other Ambulatory Visit: Payer: Self-pay | Admitting: Internal Medicine

## 2022-05-19 ENCOUNTER — Other Ambulatory Visit (HOSPITAL_COMMUNITY): Payer: Self-pay

## 2022-05-19 DIAGNOSIS — J189 Pneumonia, unspecified organism: Secondary | ICD-10-CM

## 2022-05-19 DIAGNOSIS — F332 Major depressive disorder, recurrent severe without psychotic features: Secondary | ICD-10-CM

## 2022-05-19 DIAGNOSIS — I4891 Unspecified atrial fibrillation: Secondary | ICD-10-CM

## 2022-05-19 MED ORDER — DOXYCYCLINE HYCLATE 100 MG PO TABS: 100.0000 mg | ORAL_TABLET | Freq: Two times a day (BID) | ORAL | 0 refills | Status: AC

## 2022-05-19 NOTE — Telephone Encounter (Signed)
Pharmacy Patient Advocate Encounter   Received notification from [facility] that prior authorization for Nuzyra '150MG'$  tablets is required/requested.  Per Test Claim: Not on formulary   PA submitted on 05/19/22 to (ins) [employer] via CoverMyMeds [place]  Status is pending

## 2022-05-19 NOTE — Telephone Encounter (Signed)
Patient Advocate Encounter  Prior Authorization for Theresa Harrington '150MG'$  tablets has been approved.    PA# [id number] Effective dates: 05/19/22 through 05/01/23

## 2022-05-19 NOTE — Telephone Encounter (Signed)
Patient called and wants all her prescriptions from yesterday sent to [facility] in [place]  She said we should know what we sent in.

## 2022-05-19 NOTE — Telephone Encounter (Signed)
Urundi a Software engineer at [employer] called to give the cover my meds key which is: [id number]. For PA for [facility]

## 2022-05-19 NOTE — Telephone Encounter (Signed)
Pt informed that antibiotic was changed from [place] to Doxycycline.  FYI She stated that she was unable to fill cough syrup at the pharmacy. They were out of stock.

## 2022-05-22 NOTE — Telephone Encounter (Signed)
Pharmacist has been informed that course of treatment has been changed by PCP on 1/19. Pt d/c Nuzyra and started Doxycycline.

## 2022-05-22 NOTE — Progress Notes (Signed)
Office Visit    Patient Name: Theresa Harrington Date of Encounter: 05/22/2022  Primary Care Provider:  [name], MD Primary Cardiologist:  [name], MD Primary Electrophysiologist: Will Meredith Leeds, MD  Chief Complaint    Theresa Harrington is a 67 y.o. female with PMH of atrial fibrillation s/p AF ablation 07/2021, HLD, HTN, hypothyroidism, IDA, PAD s/p femoropopliteal bypass 06/2014 who presents today for 79-monthfollow-up. Past Medical History    Past Medical History:  Diagnosis Date   Allergy    Anxiety    Pt. denies   Arthritis    right index figer   Asymptomatic cholelithiasis    Atherosclerosis of aorta (HCC)    Cataract    [place]    Gallstones 06/2013   GERD (gastroesophageal reflux disease)    Glaucoma    "A LITTLE BIT"   History of kidney stones    lithotrispy   Hyperlipidemia    Hypertension    Hypothyroidism    no meds   Iron deficiency anemia    PAD (peripheral artery disease) (HCC)    PAF (paroxysmal atrial fibrillation) (HCC)    Peripheral arterial occlusive disease (HCC)    lower extremities   Pneumonia    PONV (postoperative nausea and vomiting)    Right ureteral stone    Type 2 diabetes mellitus ([place]    Type   Vitamin B 12 deficiency    Wears glasses    Past Surgical History:  Procedure Laterality Date   ABDOMINAL AORTOGRAM W/LOWER EXTREMITY N/A 03/09/2020   Procedure: ABDOMINAL AORTOGRAM W/LOWER EXTREMITY;  Surgeon: BSerafina Mitchell MD;  [place];  Service: Cardiovascular;  Laterality: N/A;   AMPUTATION Left 07/16/2014   Procedure: LEFT SECOND TOE AMPUTATION;  Surgeon: VSerafina Mitchell MD;  [place]  Service: Vascular;  Laterality: Left;  With Nerve block   ATRIAL FIBRILLATION ABLATION N/A 08/02/2021   Procedure: ATRIAL FIBRILLATION ABLATION;  Surgeon: CConstance Haw MD;  [place] [facility];  Service: Cardiovascular;  Laterality: N/A;   AUGMENTATION MAMMAPLASTY      BELPHAROPTOSIS REPAIR     eyelid lift   BIOPSY  10/14/2018   Procedure: BIOPSY;  Surgeon: MRush LandmarkGTelford Nab, MD;  [place]  Service: Gastroenterology;;   BIOPSY  08/04/2019   Procedure: BIOPSY;  Surgeon: MIrving Copas, MD;  [place]  Service: Gastroenterology;;   CARDIOVERSION N/A 09/19/2019   Procedure: CARDIOVERSION;  Surgeon: CBuford Dresser MD;  [place]  Service: Cardiovascular;  Laterality: N/A;   COLONOSCOPY     COMBINED AUGMENTATION MAMMAPLASTY AND ABDOMINOPLASTY  2009   W/  BILATERAL  THIGH LIFT   CYSTO/  RIGHT URETERAL STENT PLACEMENT  12/30/2010   CYSTOSCOPY WITH RETROGRADE PYELOGRAM, URETEROSCOPY AND STENT PLACEMENT Right 10/07/2013   Procedure: CYSTOSCOPY WITH RETROGRADE PYELOGRAM, right URETEROSCOPY AND STENT PLACEMENT, stone extraction;  Surgeon: MArvil Persons MD;  [facility]  Service: Urology;  Laterality: Right;   CYSTOSCOPY WITH STENT PLACEMENT Right 06/04/2013   Procedure: CYSTOSCOPY WITH STENT PLACEMENT;  Surgeon: SFranchot Gallo MD;  [facility];  Service: Urology;  Laterality: Right;   DILATATION & CURETTAGE/HYSTEROSCOPY WITH MYOSURE N/A 04/27/2015   Procedure: DILATATION & CURETTAGE/HYSTEROSCOPY WITH MYOSURE;  Surgeon: JTerrance Mass MD;  [place];  Service: Gynecology;  Laterality: N/A;   ENDARTERECTOMY FEMORAL Left 06/08/2014   Procedure: Left Leg Common Femoral and External Iliac  Endartarectomy with patch Angioplasty;  Surgeon: Rosetta Posner, MD;  Location: [facility];  Service: Vascular;  Laterality: Left;   ESOPHAGOGASTRODUODENOSCOPY N/A 10/14/2018   Procedure: ESOPHAGOGASTRODUODENOSCOPY (EGD);  Surgeon: Irving Copas., MD;  Location: [place];  Service: Gastroenterology;  Laterality: N/A;   ESOPHAGOGASTRODUODENOSCOPY (EGD) WITH PROPOFOL N/A 11/06/2018   Procedure: ESOPHAGOGASTRODUODENOSCOPY (EGD) WITH PROPOFOL;  Surgeon: Rush Landmark Telford Nab.,  MD;  [facility];  Service: Gastroenterology;  Laterality: N/A;  RFA   ESOPHAGOGASTRODUODENOSCOPY (EGD) WITH PROPOFOL N/A 02/12/2019   Procedure: ESOPHAGOGASTRODUODENOSCOPY (EGD) WITH PROPOFOL;  Surgeon: Rush Landmark Telford Nab., MD;  [facility];  Service: Gastroenterology;  Laterality: N/A;   ESOPHAGOGASTRODUODENOSCOPY (EGD) WITH PROPOFOL N/A 08/04/2019   Procedure: ESOPHAGOGASTRODUODENOSCOPY (EGD) WITH PROPOFOL;  Surgeon: Rush Landmark Telford Nab., MD;  Location: [place];  Service: Gastroenterology;  Laterality: N/A;  WITH RFA   EXTRACORPOREAL SHOCK WAVE LITHOTRIPSY Right 08-04-2013//   06-23-2013//   01-16-2011   EYE SURGERY Bilateral    cataract   FEMORAL-POPLITEAL BYPASS GRAFT Left 06/08/2014   Procedure: Left Leg Femoral -Popliteal Bypass Graft;  Surgeon: Rosetta Posner, MD;  [facility];  Service: Vascular;  Laterality: Left;   FEMORAL-POPLITEAL BYPASS GRAFT Left 06/09/2014   Procedure: Left Femoral and Popliteal Exposure; Left Femoral to Anterior Tibial Bypass Graft using Propaten 66m by 80cm Goretex Graft; Left Tibial Endarterectomy; Left Femoraland Popliteal Thrombectomy ;  Surgeon: VSerafina Mitchell MD;  [place]  Service: Vascular;  Laterality: Left;   GI RADIOFREQUENCY ABLATION N/A 11/06/2018   Procedure: GI RADIOFREQUENCY ABLATION;  Surgeon: MIrving Copas, MD;  [facility];  Service: Gastroenterology;  Laterality: N/A;   GI RADIOFREQUENCY ABLATION N/A 02/12/2019   Procedure: GI RADIOFREQUENCY ABLATION;  Surgeon: MRush LandmarkGTelford Nab, MD;  [facility];  Service: Gastroenterology;  Laterality: N/A;   GI RADIOFREQUENCY ABLATION N/A 08/04/2019   Procedure: GI RADIOFREQUENCY ABLATION;  Surgeon: MRush LandmarkGTelford Nab, MD;  [place]  Service: Gastroenterology;  Laterality: N/A;   HOLMIUM LASER APPLICATION Right [id number]  Procedure: HOLMIUM LASER APPLICATION;  Surgeon: MArvil Persons MD;  [facility]  Service: Urology;  Laterality: Right;   KIDNEY STONE SURGERY  07/2013   1-2 stones   LAPAROSCOPIC GASTRIC BANDING  05/29/2005   LITHOTRIPSY  2-3 times   LOWER EXTREMITY ANGIOGRAM N/A 06/04/2014   Procedure: LOWER EXTREMITY ANGIOGRAM;  Surgeon: VSerafina Mitchell MD;  [facility];  Service: Cardiovascular;  Laterality: N/A;   ORIF FIFTH METACARPAL FX  RIGHT HAND  04/21/2002   POLYPECTOMY     REVISION AND RE-SITING LAP-BAND PORT  04/08/2010   W/  UPPER EGD   RIGHT KNEE PATELLECTOMY W/ REPAIR OF EXTENSOR MECHANISM  04/14/2002   TEE WITHOUT CARDIOVERSION N/A 12/24/2019   Procedure: TRANSESOPHAGEAL ECHOCARDIOGRAM (TEE);  Surgeon: TSueanne Margarita MD;  [facility] [place];  Service: Cardiovascular;  Laterality: N/A;   Toenail removed Left 05/21/2014   2nd toenail-  Dr. PBarkley Bruns   Allergies  Allergies  Allergen Reactions   Amlodipine Swelling   Atorvastatin Other (See Comments)    Muscle aches    Ampicillin Nausea And Vomiting and Other (See Comments)   Codeine Nausea And Vomiting    Ask patient   Lisinopril Cough        Penicillins Nausea And Vomiting    Did it involve swelling of the face/tongue/throat, SOB, or low BP? No Did it involve sudden or severe rash/hives, skin peeling, or any reaction on the inside of your mouth  or nose? No Did you need to seek medical attention at a hospital or doctor's office? No When did it last happen?      20+ years If all above answers are "NO", may proceed with cephalosporin use.    Januvia [Sitagliptin] Nausea And Vomiting    History of Present Illness    Temima Teed  is a 67 year old female with the above mention past medical history who presents today for 54-monthfollow-up.  Ms. ASobczakwas initially seen by Dr. SMarlou Porchin 2018 for management of atrial fibrillation.  She has a past medical history of femoral-popliteal bypass by Dr. BTrula Sladein 2016 Plavix was discontinued patient was placed on Xarelto due to atrial  fibrillation.  2D echo was completed 2018 EF 55-60% Mild MS LA severely dilated.  She was seen in follow-up 08/2019 and AF with RVR and underwent DCCV with conversion to sinus rhythm.  She underwent 2D echo that showed severe mitral annular calcification with thickening of posterior leaflet and mild stenosis.  She was referred to Dr. CCurt Bearsfor atrial fibrillation management and underwent AF ablation on 08/03/2021.  She was last seen by Dr. SMarlou Porch12/10/2021 and was doing well overall but endorsed an episode of atrial fibrillation with the flu.  She was started on HCTZ 12.5 mg daily.   Ms. ADismukepresents today for 332-monthollow-up for atrial fibrillation.  Since last being seen in the office patient reports that she is doing much better but has experienced COVID and pneumonia since her previous visit.  She was seen recently by her PCP and was found to have atrial fibrillation with RVR.  She was started on Cardizem 120 mg however patient elected not to begin medication.  Her blood pressure today was well-controlled at 130/60 and heart rate today is 64.  She is tolerating her medications without any adverse reactions currently.  She was also started on HCTZ at last visit however she never picked it up at the pharmacy.  She mentioned also complained of cramping bilaterally in her calves.  She reports that this discomfort is not similar to her previous claudication symptoms.  She was advised to continue to monitor at this time.  Patient denies chest pain, palpitations, dyspnea, PND, orthopnea, nausea, vomiting, dizziness, syncope, edema, weight gain, or early satiety.  Home Medications    Current Outpatient Medications  Medication Sig Dispense Refill   Blood Glucose Monitoring Suppl (ONETOUCH VERIO IQ SYSTEM) w/Device KIT USE TO CHECK SUGAR DAILY 1 kit 0   cetirizine (ZYRTEC) 10 MG tablet Take 10 mg by mouth daily as needed for allergies.     chlorpheniramine-HYDROcodone (TUSSIONEX) 10-8 MG/5ML Take 5 mLs by  mouth every 12 (twelve) hours as needed for cough. 115 mL 0   Continuous Blood Gluc Receiver (DEXCOM G6 RECEIVER) DEVI 1 Act by Does not apply route daily. 1 each 5   Continuous Blood Gluc Sensor (DEXCOM G6 SENSOR) MISC 1 Act by Does not apply route daily. 1 each 5   Continuous Blood Gluc Transmit (DEXCOM G6 TRANSMITTER) MISC 1 Act by Does not apply route daily. 1 each 5   diltiazem (CARDIZEM CD) 120 MG 24 hr capsule Take 1 capsule (120 mg total) by mouth daily. 90 capsule 0   dorzolamide-timolol (COSOPT) 22.3-6.8 MG/ML ophthalmic solution Place 1 drop into the right eye 2 (two) times daily.      doxycycline (VIBRA-TABS) 100 MG tablet Take 1 tablet (100 mg total) by mouth 2 (two) times daily for 7 days.  14 tablet 0   DULoxetine (CYMBALTA) 30 MG capsule Take 1 capsule (30 mg total) by mouth daily. 30 capsule 0   Empagliflozin-metFORMIN HCl ER (SYNJARDY XR) 25-1000 MG TB24 Take 1 tablet by mouth daily. 90 tablet 1   Evolocumab (REPATHA) 140 MG/ML SOSY Inject 1 Act into the skin every 14 (fourteen) days. 6.3 mL 1   Glucagon (GVOKE HYPOPEN 2-PACK) 1 MG/0.2ML SOAJ Inject 1 Act into the skin daily as needed. 2 mL 5   Insulin Glargine (BASAGLAR KWIKPEN) 100 UNIT/ML Inject 50 Units into the skin at bedtime. [place] patient assistance (Patient taking differently: Inject 40-42 Units into the skin at bedtime. Waves patient assistance taking 46 units) 30 mL 2   insulin lispro (HUMALOG KWIKPEN) 100 UNIT/ML KwikPen Inject 14 Units into the skin 3 (three) times daily. 45 mL 1   Insulin Pen Needle (PEN NEEDLES) 32G X 4 MM MISC Inject 1 pen  as directed 4 (four) times daily. Use to inject levemir or novolog as directed. 400 each 3   latanoprost (XALATAN) 0.005 % ophthalmic solution Place 1 drop into both eyes at bedtime.      magnesium oxide (MAG-OX) 400 (240 Mg) MG tablet TAKE ONE TABLET BY MOUTH EVERYDAY AT BEDTIME 90 tablet 1   nebivolol (BYSTOLIC) 10 MG tablet Take 1 tablet (10 mg total) by  mouth daily. 90 tablet 3   omeprazole (PRILOSEC) 40 MG capsule TAKE ONE CAPSULE BY MOUTH EVERY MORNING and TAKE ONE CAPSULE BY MOUTH EVERY EVENING 180 capsule 1   ondansetron (ZOFRAN) 4 MG tablet Take 1 tablet (4 mg total) by mouth every 8 (eight) hours as needed for nausea or vomiting. 35 tablet 0   ONETOUCH VERIO test strip USE TO check blood glucose TWICE DAILY 200 strip 3   pravastatin (PRAVACHOL) 40 MG tablet TAKE ONE TABLET BY MOUTH EVERYDAY AT BEDTIME 90 tablet 1   predniSONE (DELTASONE) 10 MG tablet 3 tabs by mouth per day for 3 days,2tabs per day for 3 days,1tab per day for 3 days 18 tablet 0   Semaglutide, 1 MG/DOSE, (OZEMPIC, 1 MG/DOSE,) 4 MG/3ML SOPN Inject 1 mg into the skin once a week. [employer] patient assistance 9 mL 1   XARELTO 20 MG TABS tablet Take 1 tablet by mouth once daily 90 tablet 0   No current facility-administered medications for this visit.     Review of Systems  Please see the history of present illness.    (+) Congestion (+) Frustration and anxiety  All other systems reviewed and are otherwise negative except as noted above.  Physical Exam    Wt Readings from Last 3 Encounters:  05/18/22 162 lb (73.5 kg)  05/08/22 164 lb (74.4 kg)  04/19/22 167 lb (75.8 kg)   YW:VPXTG were no vitals filed for this visit.,There is no height or weight on file to calculate BMI.  Constitutional:      Appearance: Healthy appearance. Not in distress.  Neck:     Vascular: JVD normal.  Pulmonary:     Effort: Pulmonary effort is normal.     Breath sounds: No wheezing. No rales. Diminished in the bases Cardiovascular:     Normal rate. Regular rhythm. Normal S1. Normal S2.      Murmurs: There is no murmur.  Edema:    Peripheral edema absent.  Abdominal:     Palpations: Abdomen is soft non tender. There is no hepatomegaly.  Skin:    General: Skin is warm  and dry.  Neurological:     General: No focal deficit present.     Mental Status: Alert and oriented to  person, place and time.     Cranial Nerves: Cranial nerves are intact.  EKG/LABS/Other Studies Reviewed    ECG personally reviewed by me today -none completed today  Risk Assessment/Calculations:    CHA2DS2-VASc Score = 5   This indicates a 7.2% annual risk of stroke. The patient's score is based upon: CHF History: 0 HTN History: 1 Diabetes History: 1 Stroke History: 0 Vascular Disease History: 1 Age Score: 1 Gender Score: 1     Lab Results  Component Value Date   WBC 7.8 12/14/2021   HGB 13.9 12/14/2021   HCT 43.3 12/14/2021   MCV 89.1 12/14/2021   PLT 304 12/14/2021   Lab Results  Component Value Date   CREATININE 0.70 04/04/2022   BUN 11 04/04/2022   NA 139 04/04/2022   K 4.3 04/04/2022   CL 104 04/04/2022   CO2 28 04/04/2022   Lab Results  Component Value Date   ALT 11 04/04/2022   AST 13 04/04/2022   ALKPHOS 75 04/04/2022   BILITOT 0.4 04/04/2022   Lab Results  Component Value Date   CHOL 223 (H) 04/04/2022   HDL 53.40 04/04/2022   LDLCALC 131 (H) 04/04/2022   LDLDIRECT 160.2 03/01/2010   TRIG 192.0 (H) 04/04/2022   CHOLHDL 4 04/04/2022    Lab Results  Component Value Date   HGBA1C 8.5 (H) 04/04/2022    Assessment & Plan    1.  Persistent atrial fibrillation: -s/p AF ablation 07/2021 by Dr. Curt Bears -Today patient is rate controlled 64 bpm. Visit we discussed the pathophysiology of atrial fibrillation and the importance to comply with medications. -Continue rate control with Cardizem 120 mg daily and diastolic 10 mg daily -Patient's current creatinine clearance is 93 mL/min continue Xarelto 20 mg daily -CHA2DS2-VASc Score = 5  The patient's score is based upon: CHF History: 0 HTN History: 1 Diabetes History: 1 Stroke History: 0 Vascular Disease History: 1 Age Score: 1 Gender Score: 1  2.  Essential hypertension: -Patient's blood pressure today was 130/60 -Continue Cardizem, losartan 25 mg, and Bystolic 10 mg  3.  Peripheral artery  disease: -s/p femoropopliteal bypass 2016 and currently followed by [facility] -Patient had complained of cramping in lower extremities and was advised to continue to monitor for any claudication symptoms.  4.  Hyperlipidemia: -Patient's last LDL cholesterol was -Continue Repatha and pravastatin    5.  DM type II: -Continue treatment plan per PCP and endocrinology   Disposition: Follow-up with [name], MD or APP in 3 months     Medication Adjustments/Labs and Tests Ordered: Current medicines are reviewed at length with the patient today.  Concerns regarding medicines are outlined above.   Signed, Mable Fill, Marissa Nestle, NP 05/22/2022, 7:12 PM [facility]  Note:  This document was prepared using Dragon voice recognition software and may include unintentional dictation errors.

## 2022-05-22 NOTE — Telephone Encounter (Signed)
Pharmacist is calling to verify if the pt picked up Nuzyra samples from our office on Friday 05-19-22 so she can determine how many samples the pt still needs.  Please call pharmacy to verify:  [phone]

## 2022-05-23 ENCOUNTER — Encounter: Payer: Self-pay | Admitting: Nurse Practitioner

## 2022-05-23 ENCOUNTER — Ambulatory Visit: Payer: PPO | Attending: Nurse Practitioner | Admitting: Nurse Practitioner

## 2022-05-23 VITALS — BP 130/60 | HR 64 | Ht 64.5 in | Wt 164.0 lb

## 2022-05-23 DIAGNOSIS — I10 Essential (primary) hypertension: Secondary | ICD-10-CM

## 2022-05-23 DIAGNOSIS — I4819 Other persistent atrial fibrillation: Secondary | ICD-10-CM | POA: Diagnosis not present

## 2022-05-23 DIAGNOSIS — I739 Peripheral vascular disease, unspecified: Secondary | ICD-10-CM | POA: Diagnosis not present

## 2022-05-23 DIAGNOSIS — E119 Type 2 diabetes mellitus without complications: Secondary | ICD-10-CM

## 2022-05-23 DIAGNOSIS — I4891 Unspecified atrial fibrillation: Secondary | ICD-10-CM | POA: Diagnosis not present

## 2022-05-23 DIAGNOSIS — E785 Hyperlipidemia, unspecified: Secondary | ICD-10-CM | POA: Diagnosis not present

## 2022-05-23 MED ORDER — DILTIAZEM HCL ER COATED BEADS 120 MG PO CP24: 120.0000 mg | ORAL_CAPSULE | Freq: Every day | ORAL | 1 refills | Status: DC

## 2022-05-23 NOTE — Patient Instructions (Addendum)
Medication Instructions:  RESTART Cardizem '120mg'$  Take 1 tablet once a day  *If you need a refill on your cardiac medications before your next appointment, please call your pharmacy*  Lab Work: None ordered  Testing/Procedures: None ordered  Follow-Up: At [facility], you and your health needs are our priority.  As part of our continuing mission to provide you with exceptional heart care, we have created designated Provider Care Teams.  These Care Teams include your primary Cardiologist (physician) and Advanced Practice Providers (APPs -  Physician Assistants and Nurse Practitioners) who all work together to provide you with the care you need, when you need it.  We recommend signing up for the patient portal called "MyChart".  Sign up information is provided on this After Visit Summary.  MyChart is used to connect with patients for Virtual Visits (Telemedicine).  Patients are able to view lab/test results, encounter notes, upcoming appointments, etc.  Non-urgent messages can be sent to your provider as well.   To learn more about what you can do with MyChart, go to [email or web address].    Your next appointment:   3 month(s)  Provider:   Ambrose Pancoast, NP       Other Instructions You have been referred to lipid clinic Check your blood pressure daily for  2 weeks then contact the office with your readings.

## 2022-05-30 DIAGNOSIS — E1151 Type 2 diabetes mellitus with diabetic peripheral angiopathy without gangrene: Secondary | ICD-10-CM | POA: Diagnosis not present

## 2022-05-30 DIAGNOSIS — L89891 Pressure ulcer of other site, stage 1: Secondary | ICD-10-CM | POA: Diagnosis not present

## 2022-06-07 ENCOUNTER — Ambulatory Visit (INDEPENDENT_AMBULATORY_CARE_PROVIDER_SITE_OTHER): Payer: PPO | Admitting: Internal Medicine

## 2022-06-07 ENCOUNTER — Encounter: Payer: Self-pay | Admitting: Internal Medicine

## 2022-06-07 VITALS — BP 146/70 | HR 59 | Temp 98.2°F | Ht 64.5 in | Wt 169.0 lb

## 2022-06-07 DIAGNOSIS — I4819 Other persistent atrial fibrillation: Secondary | ICD-10-CM

## 2022-06-07 DIAGNOSIS — I10 Essential (primary) hypertension: Secondary | ICD-10-CM

## 2022-06-07 DIAGNOSIS — F332 Major depressive disorder, recurrent severe without psychotic features: Secondary | ICD-10-CM

## 2022-06-07 DIAGNOSIS — E538 Deficiency of other specified B group vitamins: Secondary | ICD-10-CM

## 2022-06-07 DIAGNOSIS — Z23 Encounter for immunization: Secondary | ICD-10-CM

## 2022-06-07 DIAGNOSIS — G63 Polyneuropathy in diseases classified elsewhere: Secondary | ICD-10-CM | POA: Diagnosis not present

## 2022-06-07 LAB — CBC WITH DIFFERENTIAL/PLATELET
Basophils Absolute: 0.1 10*3/uL (ref 0.0–0.1)
Basophils Relative: 0.8 % (ref 0.0–3.0)
Eosinophils Absolute: 0.1 10*3/uL (ref 0.0–0.7)
Eosinophils Relative: 1.3 % (ref 0.0–5.0)
HCT: 36.9 % (ref 36.0–46.0)
Hemoglobin: 12.3 g/dL (ref 12.0–15.0)
Lymphocytes Relative: 31.9 % (ref 12.0–46.0)
Lymphs Abs: 2.4 10*3/uL (ref 0.7–4.0)
MCHC: 33.3 g/dL (ref 30.0–36.0)
MCV: 87.2 fl (ref 78.0–100.0)
Monocytes Absolute: 0.6 10*3/uL (ref 0.1–1.0)
Monocytes Relative: 7.5 % (ref 3.0–12.0)
Neutro Abs: 4.3 10*3/uL (ref 1.4–7.7)
Neutrophils Relative %: 58.5 % (ref 43.0–77.0)
Platelets: 298 10*3/uL (ref 150.0–400.0)
RBC: 4.22 Mil/uL (ref 3.87–5.11)
RDW: 19.2 % — ABNORMAL HIGH (ref 11.5–15.5)
WBC: 7.4 10*3/uL (ref 4.0–10.5)

## 2022-06-07 MED ORDER — CYANOCOBALAMIN 1000 MCG/ML IJ SOLN
1000.0000 ug | Freq: Once | INTRAMUSCULAR | Status: AC
  Administered 2022-06-07: 1000 ug via INTRAMUSCULAR

## 2022-06-07 MED ORDER — VILAZODONE HCL 10 MG PO TABS: 10.0000 mg | ORAL_TABLET | Freq: Every day | ORAL | 0 refills | Status: DC

## 2022-06-07 NOTE — Progress Notes (Signed)
Subjective:  Patient ID: Theresa Harrington, female    DOB: [birth year]  Age: 67 y.o. MRN: [id number]  CC: Hypertension, Depression, Atrial Fibrillation, and Diabetes   HPI Theresa Harrington presents for f/up -  She is not taking Cymbalta because she did not like the way it made her feel.  She would like to try a different antidepressant.  She denies chest pain, shortness of breath, palpitations, dizziness, lightheadedness, or edema.  Outpatient Medications Prior to Visit  Medication Sig Dispense Refill   Blood Glucose Monitoring Suppl (ONETOUCH VERIO IQ SYSTEM) w/Device KIT USE TO CHECK SUGAR DAILY 1 kit 0   cetirizine (ZYRTEC) 10 MG tablet Take 10 mg by mouth daily as needed for allergies.     diltiazem (CARDIZEM CD) 120 MG 24 hr capsule Take 1 capsule (120 mg total) by mouth daily. 90 capsule 1   dorzolamide-timolol (COSOPT) 22.3-6.8 MG/ML ophthalmic solution Place 1 drop into the right eye 2 (two) times daily.      Empagliflozin-metFORMIN HCl ER (SYNJARDY XR) 25-1000 MG TB24 Take 1 tablet by mouth daily. 90 tablet 1   Insulin Glargine (BASAGLAR KWIKPEN) 100 UNIT/ML Inject 50 Units into the skin at bedtime. [place] patient assistance (Patient taking differently: Inject 40-42 Units into the skin at bedtime. [place] patient assistance taking 46 units) 30 mL 2   insulin lispro (HUMALOG KWIKPEN) 100 UNIT/ML KwikPen Inject 14 Units into the skin 3 (three) times daily. 45 mL 1   Insulin Pen Needle (PEN NEEDLES) 32G X 4 MM MISC Inject 1 pen  as directed 4 (four) times daily. Use to inject levemir or novolog as directed. 400 each 3   latanoprost (XALATAN) 0.005 % ophthalmic solution Place 1 drop into both eyes at bedtime.      losartan (COZAAR) 25 MG tablet Take 25 mg by mouth daily.     magnesium oxide (MAG-OX) 400 (240 Mg) MG tablet TAKE ONE TABLET BY MOUTH EVERYDAY AT BEDTIME 90 tablet 1   nebivolol (BYSTOLIC) 10 MG tablet Take 1 tablet (10 mg total) by mouth daily. 90 tablet 3   omeprazole  (PRILOSEC) 40 MG capsule TAKE ONE CAPSULE BY MOUTH EVERY MORNING and TAKE ONE CAPSULE BY MOUTH EVERY EVENING 180 capsule 1   ONETOUCH VERIO test strip USE TO check blood glucose TWICE DAILY 200 strip 3   pravastatin (PRAVACHOL) 40 MG tablet TAKE ONE TABLET BY MOUTH EVERYDAY AT BEDTIME 90 tablet 1   Semaglutide, 1 MG/DOSE, (OZEMPIC, 1 MG/DOSE,) 4 MG/3ML SOPN Inject 1 mg into the skin once a week. [employer] patient assistance 9 mL 1   XARELTO 20 MG TABS tablet Take 1 tablet by mouth once daily 90 tablet 0   DULoxetine (CYMBALTA) 30 MG capsule Take 1 capsule (30 mg total) by mouth daily. 30 capsule 0   No facility-administered medications prior to visit.    ROS Review of Systems  Constitutional: Negative.  Negative for diaphoresis and fatigue.  HENT: Negative.    Eyes: Negative.   Respiratory:  Negative for cough, chest tightness, shortness of breath and wheezing.   Cardiovascular:  Negative for chest pain, palpitations and leg swelling.  Gastrointestinal:  Negative for abdominal pain, diarrhea, nausea and vomiting.  Endocrine: Negative.   Genitourinary: Negative.  Negative for difficulty urinating.  Musculoskeletal: Negative.  Negative for arthralgias.  Skin: Negative.  Negative for color change.  Neurological: Negative.  Negative for dizziness and weakness.  Hematological:  Negative for adenopathy. Does not bruise/bleed easily.  Psychiatric/Behavioral:  Positive for dysphoric mood and sleep disturbance. Negative for confusion, decreased concentration and suicidal ideas. The patient is nervous/anxious.     Objective:  BP (!) 146/70 (BP Location: Right Arm, Patient Position: Sitting, Cuff Size: Large)   Pulse (!) 59   Temp 98.2 F (36.8 C) (Oral)   Ht 5' 4.5" (1.638 m)   Wt 169 lb (76.7 kg)   LMP  (LMP Unknown)   SpO2 96%   BMI 28.56 kg/m   BP Readings from Last 3 Encounters:  06/07/22 (!) 146/70  05/23/22 130/60  05/18/22 124/76    Wt Readings from Last 3 Encounters:   06/07/22 169 lb (76.7 kg)  05/23/22 164 lb (74.4 kg)  05/18/22 162 lb (73.5 kg)    Physical Exam Vitals reviewed.  HENT:     Nose: Nose normal.     Mouth/Throat:     Mouth: Mucous membranes are moist.  Eyes:     General: No scleral icterus.    Conjunctiva/sclera: Conjunctivae normal.  Cardiovascular:     Rate and Rhythm: Normal rate and regular rhythm.     Heart sounds: No murmur heard. Pulmonary:     Effort: Pulmonary effort is normal.     Breath sounds: No stridor. No wheezing, rhonchi or rales.  Abdominal:     General: Abdomen is flat.     Palpations: There is no mass.     Tenderness: There is no abdominal tenderness. There is no guarding.     Hernia: No hernia is present.  Musculoskeletal:        General: Normal range of motion.     Cervical back: Neck supple.     Right lower leg: No edema.     Left lower leg: No edema.  Lymphadenopathy:     Cervical: No cervical adenopathy.  Skin:    General: Skin is warm and dry.  Neurological:     General: No focal deficit present.     Mental Status: She is alert.  Psychiatric:        Attention and Perception: She is inattentive.        Mood and Affect: Mood is anxious and depressed. Affect is tearful.        Behavior: Behavior normal.     Lab Results  Component Value Date   WBC 7.4 06/07/2022   HGB 12.3 06/07/2022   HCT 36.9 06/07/2022   PLT 298.0 06/07/2022   GLUCOSE 123 (H) 04/04/2022   CHOL 223 (H) 04/04/2022   TRIG 192.0 (H) 04/04/2022   HDL 53.40 04/04/2022   LDLDIRECT 160.2 03/01/2010   LDLCALC 131 (H) 04/04/2022   ALT 11 04/04/2022   AST 13 04/04/2022   NA 139 04/04/2022   K 4.3 04/04/2022   CL 104 04/04/2022   CREATININE 0.70 04/04/2022   BUN 11 04/04/2022   CO2 28 04/04/2022   TSH 0.51 06/23/2021   INR 1.3 (H) 03/08/2020   HGBA1C 8.5 (H) 04/04/2022   MICROALBUR 1.5 04/04/2022    VAS [place] ABI WITH/WO TBI  Result Date: 03/13/2022  LOWER EXTREMITY DOPPLER STUDY Patient Name:  Theresa Harrington  Date of  Exam:   03/13/2022 Medical Rec #: [id number]   Accession #:    [id number] Date of Birth: 09/15/1955  Patient Gender: F Patient Age:   84 years Exam Location:  [facility] Procedure:      VAS [place] ABI WITH/WO TBI Referring Phys: Harold Barban --------------------------------------------------------------------------------  Indications: Peripheral artery disease. High Risk Factors: Hypertension,  hyperlipidemia..Atrial Fibrillation, diabetic Other Factors:  Vascular Interventions: Hx occluded left Fem Pop bypass graft.                         Left ATA endarterectomy. Left pop thrombectomy. Left                         CFA, EIA angioplasty. 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                          Right EIA 50-99% stenosis. Performing Technologist: Alvia Grove RVT  Examination Guidelines: A complete evaluation includes at minimum, Doppler waveform signals and systolic blood pressure reading at the level of bilateral brachial, anterior tibial, and posterior tibial arteries, when vessel segments are accessible. Bilateral testing is considered an integral part of a complete examination. Photoelectric Plethysmograph (PPG) waveforms and toe systolic pressure readings are included as required and additional duplex testing as needed. Limited examinations for reoccurring indications may be performed as noted.  ABI Findings: +---------+------------------+-----+----------+--------+ Right    Rt Pressure (mmHg)IndexWaveform  Comment  +---------+------------------+-----+----------+--------+ Brachial 213                                       +---------+------------------+-----+----------+--------+ PTA      135               0.63 monophasic         +---------+------------------+-----+----------+--------+ DP       97                0.45 monophasic         +---------+------------------+-----+----------+--------+ Great Toe76                0.36 Abnormal            +---------+------------------+-----+----------+--------+ +---------+------------------+-----+----------+-------+ Left     Lt Pressure (mmHg)IndexWaveform  Comment +---------+------------------+-----+----------+-------+ Brachial 214                                      +---------+------------------+-----+----------+-------+ PTA      177               0.83 monophasic        +---------+------------------+-----+----------+-------+ DP       162               0.76 monophasic        +---------+------------------+-----+----------+-------+ Great Toe79                0.37 Abnormal          +---------+------------------+-----+----------+-------+ +-------+-----------+-----------+------------+------------+ ABI/TBIToday's ABIToday's TBIPrevious ABIPrevious TBI +-------+-----------+-----------+------------+------------+ Right  0.63       0.36       0.63        0.20         +-------+-----------+-----------+------------+------------+ Left   0.83       0.37       0.73        0.36         +-------+-----------+-----------+------------+------------+  Right ABIs appear essentially unchanged. Left ABI appears increased , may be falsely elevated.  Summary: Right: Resting right ankle-brachial index indicates moderate right lower extremity arterial disease. The right toe-brachial index is abnormal. Left: Resting left ankle-brachial index indicates  mild left lower extremity arterial disease, may be falsely elevated due to calcified arteries.. The left toe-brachial index is abnormal. *See table(s) above for measurements and observations.  Electronically signed by Harold Barban MD on 03/13/2022 at 11:43:40 AM.    Final    DG Chest 2 View  Result Date: 05/18/2022 CLINICAL DATA:  Cough and congestion EXAM: CHEST - 2 VIEW COMPARISON:  None Available. FINDINGS: Normal cardiac silhouette. Band of linear atelectasis in the RIGHT lower lobe. Normal pulmonary vasculature. No evidence of effusion,  infiltrate, or pneumothorax. No acute bony abnormality. IMPRESSION: 1. No evidence pneumonia. 2. New atelectasis in the RIGHT lower lobe. Electronically Signed   By: Suzy Bouchard M.D.   On: 05/18/2022 16:26   VAS [place] ABI WITH/WO TBI  Result Date: 03/13/2022  LOWER EXTREMITY DOPPLER STUDY Patient Name:  Theresa Harrington  Date of Exam:   03/13/2022 Medical Rec #: [id number]   Accession #:    [id number] Date of Birth: [birth year]  Patient Gender: F Patient Age:   77 years Exam Location:  [facility] Procedure:      VAS [place] ABI WITH/WO TBI Referring Phys: Harold Barban --------------------------------------------------------------------------------  Indications: Peripheral artery disease. High Risk Factors: Hypertension, hyperlipidemia..Atrial Fibrillation, diabetic Other Factors:  Vascular Interventions: Hx occluded left Fem Pop bypass graft.                         Left ATA endarterectomy. Left pop thrombectomy. Left                         CFA, EIA angioplasty. 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                          Right EIA 50-99% stenosis. Performing Technologist: Alvia Grove RVT  Examination Guidelines: A complete evaluation includes at minimum, Doppler waveform signals and systolic blood pressure reading at the level of bilateral brachial, anterior tibial, and posterior tibial arteries, when vessel segments are accessible. Bilateral testing is considered an integral part of a complete examination. Photoelectric Plethysmograph (PPG) waveforms and toe systolic pressure readings are included as required and additional duplex testing as needed. Limited examinations for reoccurring indications may be performed as noted.  ABI Findings: +---------+------------------+-----+----------+--------+ Right    Rt Pressure (mmHg)IndexWaveform  Comment  +---------+------------------+-----+----------+--------+ Brachial 213                                        +---------+------------------+-----+----------+--------+ PTA      135               0.63 monophasic         +---------+------------------+-----+----------+--------+ DP       97                0.45 monophasic         +---------+------------------+-----+----------+--------+ Great Toe76                0.36 Abnormal           +---------+------------------+-----+----------+--------+ +---------+------------------+-----+----------+-------+ Left     Lt Pressure (mmHg)IndexWaveform  Comment +---------+------------------+-----+----------+-------+ Brachial 214                                      +---------+------------------+-----+----------+-------+  PTA      177               0.83 monophasic        +---------+------------------+-----+----------+-------+ DP       162               0.76 monophasic        +---------+------------------+-----+----------+-------+ Great Toe79                0.37 Abnormal          +---------+------------------+-----+----------+-------+ +-------+-----------+-----------+------------+------------+ ABI/TBIToday's ABIToday's TBIPrevious ABIPrevious TBI +-------+-----------+-----------+------------+------------+ Right  0.63       0.36       0.63        0.20         +-------+-----------+-----------+------------+------------+ Left   0.83       0.37       0.73        0.36         +-------+-----------+-----------+------------+------------+  Right ABIs appear essentially unchanged. Left ABI appears increased , may be falsely elevated.  Summary: Right: Resting right ankle-brachial index indicates moderate right lower extremity arterial disease. The right toe-brachial index is abnormal. Left: Resting left ankle-brachial index indicates mild left lower extremity arterial disease, may be falsely elevated due to calcified arteries.. The left toe-brachial index is abnormal. *See table(s) above for measurements and observations.  Electronically signed  by Harold Barban MD on 03/13/2022 at 11:43:40 AM.    Final      Assessment & Plan:   Theresa Harrington was seen today for hypertension, depression, atrial fibrillation and diabetes.  Diagnoses and all orders for this visit:  Essential hypertension, benign- Her blood pressure is adequately well-controlled.  Persistent atrial fibrillation ([place])- She has good rate and rhythm control.  Will continue the current dose of the CCB.  Vitamin B12 deficiency neuropathy (HCC) -     cyanocobalamin (VITAMIN B12) injection 1,000 mcg -     CBC with Differential/Platelet; Future -     CBC with Differential/Platelet  Severe episode of recurrent major depressive disorder, without psychotic features ([place])- Will try a different antidepressant. -     Vilazodone HCl (VIIBRYD) 10 MG TABS; Take 1 tablet (10 mg total) by mouth daily for 7 days.  Other orders -     Pneumococcal polysaccharide vaccine 23-valent greater than or equal to 2yo subcutaneous/IM   I have discontinued Theresa Harrington's DULoxetine. I am also having her start on Vilazodone HCl. Additionally, I am having her maintain her latanoprost, OneTouch Verio IQ System, dorzolamide-timolol, cetirizine, Basaglar KwikPen, nebivolol, Pen Needles, pravastatin, magnesium oxide, omeprazole, Xarelto, Ozempic (1 MG/DOSE), Synjardy XR, OneTouch Verio, insulin lispro, losartan, and diltiazem. We administered cyanocobalamin.  Meds ordered this encounter  Medications   Vilazodone HCl (VIIBRYD) 10 MG TABS    Sig: Take 1 tablet (10 mg total) by mouth daily for 7 days.    Dispense:  7 tablet    Refill:  0   cyanocobalamin (VITAMIN B12) injection 1,000 mcg     Follow-up: Return in about 3 months (around 09/05/2022).  Scarlette Calico, MD

## 2022-06-07 NOTE — Patient Instructions (Signed)

## 2022-06-08 ENCOUNTER — Encounter (HOSPITAL_COMMUNITY): Payer: Self-pay | Admitting: *Deleted

## 2022-06-20 ENCOUNTER — Encounter: Payer: Self-pay | Admitting: Gastroenterology

## 2022-06-20 ENCOUNTER — Ambulatory Visit (INDEPENDENT_AMBULATORY_CARE_PROVIDER_SITE_OTHER): Payer: PPO | Admitting: Internal Medicine

## 2022-06-20 VITALS — BP 126/66 | HR 61 | Temp 98.0°F | Ht 64.5 in | Wt 167.0 lb

## 2022-06-20 DIAGNOSIS — J309 Allergic rhinitis, unspecified: Secondary | ICD-10-CM

## 2022-06-20 DIAGNOSIS — R052 Subacute cough: Secondary | ICD-10-CM | POA: Diagnosis not present

## 2022-06-20 DIAGNOSIS — I10 Essential (primary) hypertension: Secondary | ICD-10-CM

## 2022-06-20 DIAGNOSIS — E119 Type 2 diabetes mellitus without complications: Secondary | ICD-10-CM

## 2022-06-20 MED ORDER — HYDROCOD POLI-CHLORPHE POLI ER 10-8 MG/5ML PO SUER: 5.0000 mL | Freq: Two times a day (BID) | ORAL | 0 refills | Status: DC | PRN

## 2022-06-20 NOTE — Progress Notes (Signed)
Patient ID: Theresa Harrington, female   DOB: 1955/07/17, 67 y.o.   MRN: [id number]        Chief Complaint: follow up HTN, dm, cough and allergies       HPI:  Theresa Harrington is a 67 y.o. female here with c/o persistent rod cough x 1 wk, Does have several wks ongoing nasal allergy symptoms with clearish congestion, itch and sneezing, without fever, pain, ST, swelling or wheezing.  Denies worsening reflux, abd pain, dysphagia, n/v, bowel change or blood.  Pt denies chest pain, increased sob or doe, wheezing, orthopnea, PND, increased LE swelling, palpitations, dizziness or syncope.   Pt denies fever, wt loss, night sweats, loss of appetite, or other constitutional symptoms   Pt denies polydipsia, polyuria, or new focal neuro s/s.        Wt Readings from Last 3 Encounters:  06/20/22 167 lb (75.8 kg)  06/07/22 169 lb (76.7 kg)  05/23/22 164 lb (74.4 kg)   BP Readings from Last 3 Encounters:  06/20/22 126/66  06/07/22 (!) 146/70  05/23/22 130/60         Past Medical History:  Diagnosis Date   Allergy    Anxiety    Pt. denies   Arthritis    right index figer   Asymptomatic cholelithiasis    Atherosclerosis of aorta (HCC)    Cataract    Clotting disorder ([place])    Gallstones 06/2013   GERD (gastroesophageal reflux disease)    Glaucoma    "A LITTLE BIT"   History of kidney stones    lithotrispy   Hyperlipidemia    Hypertension    Hypothyroidism    no meds   Iron deficiency anemia    PAD (peripheral artery disease) (HCC)    PAF (paroxysmal atrial fibrillation) (HCC)    Peripheral arterial occlusive disease (HCC)    lower extremities   Pneumonia    PONV (postoperative nausea and vomiting)    Right ureteral stone    Type 2 diabetes mellitus ([place])    Type   Vitamin B 12 deficiency    Wears glasses    Past Surgical History:  Procedure Laterality Date   ABDOMINAL AORTOGRAM W/LOWER EXTREMITY N/A 03/09/2020   Procedure: ABDOMINAL AORTOGRAM W/LOWER EXTREMITY;  Surgeon: Serafina Mitchell, MD;   Location: [place] [facility];  Service: Cardiovascular;  Laterality: N/A;   AMPUTATION Left 07/16/2014   Procedure: LEFT SECOND TOE AMPUTATION;  Surgeon: Serafina Mitchell, MD;  Location: [place];  Service: Vascular;  Laterality: Left;  With Nerve block   ATRIAL FIBRILLATION ABLATION N/A 08/02/2021   Procedure: ATRIAL FIBRILLATION ABLATION;  Surgeon: Constance Haw, MD;  Location: [place] [facility];  Service: Cardiovascular;  Laterality: N/A;   AUGMENTATION MAMMAPLASTY     BELPHAROPTOSIS REPAIR     eyelid lift   BIOPSY  10/14/2018   Procedure: BIOPSY;  Surgeon: Rush Landmark Telford Nab., MD;  Location: [place];  Service: Gastroenterology;;   BIOPSY  08/04/2019   Procedure: BIOPSY;  Surgeon: Irving Copas., MD;  Location: [place];  Service: Gastroenterology;;   CARDIOVERSION N/A 09/19/2019   Procedure: CARDIOVERSION;  Surgeon: Buford Dresser, MD;  Location: [place];  Service: Cardiovascular;  Laterality: N/A;   COLONOSCOPY     COMBINED AUGMENTATION MAMMAPLASTY AND ABDOMINOPLASTY  2009   W/  BILATERAL  THIGH LIFT   CYSTO/  RIGHT URETERAL STENT PLACEMENT  12/30/2010   CYSTOSCOPY WITH RETROGRADE PYELOGRAM, URETEROSCOPY AND STENT PLACEMENT Right 10/07/2013   Procedure: CYSTOSCOPY WITH  RETROGRADE PYELOGRAM, right URETEROSCOPY AND STENT PLACEMENT, stone extraction;  Surgeon: Arvil Persons, MD;  Location[facility];  Service: Urology;  Laterality: Right;   CYSTOSCOPY WITH STENT PLACEMENT Right 06/04/2013   Procedure: CYSTOSCOPY WITH STENT PLACEMENT;  Surgeon: Franchot Gallo, MD;  [facility];  Service: Urology;  Laterality: Right;   DILATATION & CURETTAGE/HYSTEROSCOPY WITH MYOSURE N/A 04/27/2015   Procedure: DILATATION & CURETTAGE/HYSTEROSCOPY WITH MYOSURE;  Surgeon: Terrance Mass, MD;  Location: [facility];  Service: Gynecology;  Laterality: N/A;   ENDARTERECTOMY FEMORAL Left 06/08/2014   Procedure: Left Leg Common Femoral and External Iliac   Endartarectomy with patch Angioplasty;  Surgeon: Rosetta Posner, MD;  Location: [facility];  Service: Vascular;  Laterality: Left;   ESOPHAGOGASTRODUODENOSCOPY N/A 10/14/2018   Procedure: ESOPHAGOGASTRODUODENOSCOPY (EGD);  Surgeon: Irving Copas., MD;  Location: [place];  Service: Gastroenterology;  Laterality: N/A;   ESOPHAGOGASTRODUODENOSCOPY (EGD) WITH PROPOFOL N/A 11/06/2018   Procedure: ESOPHAGOGASTRODUODENOSCOPY (EGD) WITH PROPOFOL;  Surgeon: Rush Landmark Telford Nab., MD;  [facility];  Service: Gastroenterology;  Laterality: N/A;  RFA   ESOPHAGOGASTRODUODENOSCOPY (EGD) WITH PROPOFOL N/A 02/12/2019   Procedure: ESOPHAGOGASTRODUODENOSCOPY (EGD) WITH PROPOFOL;  Surgeon: Rush Landmark Telford Nab., MD;  [facility];  Service: Gastroenterology;  Laterality: N/A;   ESOPHAGOGASTRODUODENOSCOPY (EGD) WITH PROPOFOL N/A 08/04/2019   Procedure: ESOPHAGOGASTRODUODENOSCOPY (EGD) WITH PROPOFOL;  Surgeon: Rush Landmark Telford Nab., MD;  Location: [place];  Service: Gastroenterology;  Laterality: N/A;  WITH RFA   EXTRACORPOREAL SHOCK WAVE LITHOTRIPSY Right 08-04-2013//   06-23-2013//   01-16-2011   EYE SURGERY Bilateral    cataract   FEMORAL-POPLITEAL BYPASS GRAFT Left 06/08/2014   Procedure: Left Leg Femoral -Popliteal Bypass Graft;  Surgeon: Rosetta Posner, MD;  [facility];  Service: Vascular;  Laterality: Left;   FEMORAL-POPLITEAL BYPASS GRAFT Left 06/09/2014   Procedure: Left Femoral and Popliteal Exposure; Left Femoral to Anterior Tibial Bypass Graft using Propaten 38m by 80cm Goretex Graft; Left Tibial Endarterectomy; Left Femoraland Popliteal Thrombectomy ;  Surgeon: VSerafina Mitchell MD;  [place]  Service: Vascular;  Laterality: Left;   GI RADIOFREQUENCY ABLATION N/A 11/06/2018   Procedure: GI RADIOFREQUENCY ABLATION;  Surgeon: MIrving Copas, MD;  [facility];  Service: Gastroenterology;  Laterality: N/A;   GI RADIOFREQUENCY ABLATION N/A  02/12/2019   Procedure: GI RADIOFREQUENCY ABLATION;  Surgeon: MRush LandmarkGTelford Nab, MD;  [facility];  Service: Gastroenterology;  Laterality: N/A;   GI RADIOFREQUENCY ABLATION N/A 08/04/2019   Procedure: GI RADIOFREQUENCY ABLATION;  Surgeon: MRush LandmarkGTelford Nab, MD;  [place] [facility]  Service: Gastroenterology;  Laterality: N/A;   HOLMIUM LASER APPLICATION Right 00000000  Procedure: HOLMIUM LASER APPLICATION;  Surgeon: MArvil Persons MD;  [facility]  Service: Urology;  Laterality: Right;   KIDNEY STONE SURGERY  07/2013   1-2 stones   LAPAROSCOPIC GASTRIC BANDING  05/29/2005   LITHOTRIPSY  2-3 times   LOWER EXTREMITY ANGIOGRAM N/A 06/04/2014   Procedure: LOWER EXTREMITY ANGIOGRAM;  Surgeon: VSerafina Mitchell MD;  [facility];  Service: Cardiovascular;  Laterality: N/A;   ORIF FIFTH METACARPAL FX  RIGHT HAND  04/21/2002   POLYPECTOMY     REVISION AND RE-SITING LAP-BAND PORT  04/08/2010   W/  UPPER EGD   RIGHT KNEE PATELLECTOMY W/ REPAIR OF EXTENSOR MECHANISM  04/14/2002   TEE WITHOUT CARDIOVERSION N/A 12/24/2019   Procedure: TRANSESOPHAGEAL ECHOCARDIOGRAM (TEE);  Surgeon: TSueanne Margarita MD;  [facility];  Service: Cardiovascular;  Laterality: N/A;   Toenail removed Left 05/21/2014   2nd toenail-  Dr. Barkley Bruns    reports that she quit smoking about 20 years ago. Her smoking use included cigarettes. She has a 28.00 pack-year smoking history. She has never been exposed to tobacco smoke. She has never used smokeless tobacco. She reports that she does not drink alcohol and does not use drugs. family history includes Arthritis in an other family member; Cancer in her brother and another family member; Colon cancer in her maternal aunt; Colon polyps in her brother; Deep vein thrombosis in her father and sister; Diabetes in her brother, brother, brother, father, and sister; Heart disease in her brother, father, and sister;  Hyperlipidemia in her brother, father, mother, and sister; Hypertension in an other family member; Kidney disease in her mother; Other in her mother; Stroke in an other family member. Allergies  Allergen Reactions   Amlodipine Swelling   Atorvastatin Other (See Comments)    Muscle aches    Ampicillin Nausea And Vomiting and Other (See Comments)   Codeine Nausea And Vomiting    Ask patient   Lisinopril Cough        Penicillins Nausea And Vomiting    Did it involve swelling of the face/tongue/throat, SOB, or low BP? No Did it involve sudden or severe rash/hives, skin peeling, or any reaction on the inside of your mouth or nose? No Did you need to seek medical attention at a hospital or doctor's office? No When did it last happen?      20+ years If all above answers are "NO", may proceed with cephalosporin use.    Januvia [Sitagliptin] Nausea And Vomiting   Current Outpatient Medications on File Prior to Visit  Medication Sig Dispense Refill   Blood Glucose Monitoring Suppl (ONETOUCH VERIO IQ SYSTEM) w/Device KIT USE TO CHECK SUGAR DAILY 1 kit 0   cetirizine (ZYRTEC) 10 MG tablet Take 10 mg by mouth daily as needed for allergies.     diltiazem (CARDIZEM CD) 120 MG 24 hr capsule Take 1 capsule (120 mg total) by mouth daily. 90 capsule 1   dorzolamide-timolol (COSOPT) 22.3-6.8 MG/ML ophthalmic solution Place 1 drop into the right eye 2 (two) times daily.      Empagliflozin-metFORMIN HCl ER (SYNJARDY XR) 25-1000 MG TB24 Take 1 tablet by mouth daily. 90 tablet 1   Insulin Glargine (BASAGLAR KWIKPEN) 100 UNIT/ML Inject 50 Units into the skin at bedtime. [place] patient assistance (Patient taking differently: Inject 40-42 Units into the skin at bedtime. [place] patient assistance taking 46 units) 30 mL 2   insulin lispro (HUMALOG KWIKPEN) 100 UNIT/ML KwikPen Inject 14 Units into the skin 3 (three) times daily. 45 mL 1   Insulin Pen Needle (PEN NEEDLES) 32G X 4 MM MISC Inject 1 pen   as directed 4 (four) times daily. Use to inject levemir or novolog as directed. 400 each 3   latanoprost (XALATAN) 0.005 % ophthalmic solution Place 1 drop into both eyes at bedtime.      losartan (COZAAR) 25 MG tablet Take 25 mg by mouth daily.     magnesium oxide (MAG-OX) 400 (240 Mg) MG tablet TAKE ONE TABLET BY MOUTH EVERYDAY AT BEDTIME 90 tablet 1   nebivolol (BYSTOLIC) 10 MG tablet Take 1 tablet (10 mg total) by mouth daily. 90 tablet 3   omeprazole (PRILOSEC) 40 MG capsule TAKE ONE CAPSULE BY MOUTH EVERY MORNING and TAKE ONE CAPSULE BY MOUTH EVERY  EVENING 180 capsule 1   ONETOUCH VERIO test strip USE TO check blood glucose TWICE DAILY 200 strip 3   pravastatin (PRAVACHOL) 40 MG tablet TAKE ONE TABLET BY MOUTH EVERYDAY AT BEDTIME 90 tablet 1   Semaglutide, 1 MG/DOSE, (OZEMPIC, 1 MG/DOSE,) 4 MG/3ML SOPN Inject 1 mg into the skin once a week. [employer] patient assistance 9 mL 1   Vilazodone HCl (VIIBRYD) 10 MG TABS Take 1 tablet (10 mg total) by mouth daily for 7 days. 7 tablet 0   XARELTO 20 MG TABS tablet Take 1 tablet by mouth once daily 90 tablet 0   No current facility-administered medications on file prior to visit.        ROS:  All others reviewed and negative.  Objective        PE:  BP 126/66 (BP Location: Left Arm, Patient Position: Sitting, Cuff Size: Large)   Pulse 61   Temp 98 F (36.7 C) (Oral)   Ht 5' 4.5" (1.638 m)   Wt 167 lb (75.8 kg)   LMP  (LMP Unknown)   SpO2 95%   BMI 28.22 kg/m                 Constitutional: Pt appears in NAD               HENT: Head: NCAT.                Right Ear: External ear normal.                 Left Ear: External ear normal.                Eyes: . Pupils are equal, round, and reactive to light. Conjunctivae and EOM are normal               Nose: without d/c or deformity               Neck: Neck supple. Gross normal ROM               Cardiovascular: Normal rate and regular rhythm.                 Pulmonary/Chest: Effort  normal and breath sounds without rales or wheezing.                Abd:  Soft, NT, ND, + BS, no organomegaly               Neurological: Pt is alert. At baseline orientation, motor grossly intact               Skin: Skin is warm. No rashes, no other new lesions, LE edema - none               Psychiatric: Pt behavior is normal without agitation   Micro: none  Cardiac tracings I have personally interpreted today:  none  Pertinent Radiological findings (summarize): none   Lab Results  Component Value Date   WBC 7.4 06/07/2022   HGB 12.3 06/07/2022   HCT 36.9 06/07/2022   PLT 298.0 06/07/2022   GLUCOSE 123 (H) 04/04/2022   CHOL 223 (H) 04/04/2022   TRIG 192.0 (H) 04/04/2022   HDL 53.40 04/04/2022   LDLDIRECT 160.2 03/01/2010   LDLCALC 131 (H) 04/04/2022   ALT 11 04/04/2022   AST 13 04/04/2022   NA 139 04/04/2022   K 4.3 04/04/2022   CL 104 04/04/2022   CREATININE 0.70 04/04/2022  BUN 11 04/04/2022   CO2 28 04/04/2022   TSH 0.51 06/23/2021   INR 1.3 (H) 03/08/2020   HGBA1C 8.5 (H) 04/04/2022   MICROALBUR 1.5 04/04/2022   Assessment/Plan:  Theresa Harrington is a 67 y.o. Black or African American [2] female with  has a past medical history of Allergy, Anxiety, Arthritis, Asymptomatic cholelithiasis, Atherosclerosis of aorta ([place]), Cataract, Clotting disorder ([place]), Gallstones (06/2013), GERD (gastroesophageal reflux disease), Glaucoma, History of kidney stones, Hyperlipidemia, Hypertension, Hypothyroidism, Iron deficiency anemia, PAD (peripheral artery disease) ([place]), PAF (paroxysmal atrial fibrillation) ([place]), Peripheral arterial occlusive disease ([place]), Pneumonia, PONV (postoperative nausea and vomiting), Right ureteral stone, Type 2 diabetes mellitus ([place]), Vitamin B 12 deficiency, and Wears glasses.  Allergic rhinitis Mild to mod, for start otc allegra and nasacort asd, to f/u any worsening symptoms or concerns  Cough Seems likely related to post nasal gtt, for tx as above, delsym  otc prn  Diabetes mellitus with coincident hypertension ([place]) Lab Results  Component Value Date   HGBA1C 8.5 (H) 04/04/2022   Uncontrolled,, pt to continue current medical treatment synjardy 25 1000 mg qd, basaglar 50 u qd, humalog 14 u tid, ozempic 1 mg weekly   Essential hypertension, benign BP Readings from Last 3 Encounters:  06/20/22 126/66  06/07/22 (!) 146/70  05/23/22 130/60   Stable, pt to continue medical treatment  card cd, 120 qd, losartan 25 mg qd, bystolic 10 mg qd  Followup: Return if symptoms worsen or fail to improve.  Cathlean Cower, MD 06/24/2022 2:40 PM [facility]

## 2022-06-20 NOTE — Patient Instructions (Signed)
Please take all new medication as prescribed - the OTC Zyrtec (or Allegra) and OTC Nasacort for allergies  Please continue all other medications as before, and refills have been done for the Tussionex  Please have the pharmacy call with any other refills you may need.  Please keep your appointments with your specialists as you may have planned

## 2022-06-24 ENCOUNTER — Encounter: Payer: Self-pay | Admitting: Internal Medicine

## 2022-06-24 NOTE — Assessment & Plan Note (Addendum)
Lab Results  Component Value Date   HGBA1C 8.5 (H) 04/04/2022   Uncontrolled,, pt to continue current medical treatment synjardy 25 1000 mg qd, basaglar 50 u qd, humalog 14 u tid, ozempic 1 mg weekly as declines further change today

## 2022-06-24 NOTE — Assessment & Plan Note (Signed)
Mild to mod, for start otc allegra and nasacort asd, to f/u any worsening symptoms or concerns

## 2022-06-24 NOTE — Assessment & Plan Note (Signed)
Seems likely related to post nasal gtt, for tx as above, delsym otc prn

## 2022-06-24 NOTE — Assessment & Plan Note (Signed)
BP Readings from Last 3 Encounters:  06/20/22 126/66  06/07/22 (!) 146/70  05/23/22 130/60   Stable, pt to continue medical treatment  card cd, 120 qd, losartan 25 mg qd, bystolic 10 mg qd

## 2022-06-26 ENCOUNTER — Telehealth: Payer: Self-pay | Admitting: Gastroenterology

## 2022-06-26 NOTE — Telephone Encounter (Signed)
Inbound call from patient requesting a call back to discuss holding her XARELTO for her procedures on 2/28 at 9:00 with Dr. Loletha Carrow. Patient is also wanting to know if she can have coffee mate in her coffee. Please advise.

## 2022-06-26 NOTE — Telephone Encounter (Signed)
Patient aware to hold the Xarelto for the 2 days and question about clear liquids were answered.

## 2022-06-26 NOTE — Telephone Encounter (Signed)
Spoke to a gentleman who answered the phone and stated he would have [place] return the call.

## 2022-06-27 ENCOUNTER — Telehealth: Payer: Self-pay

## 2022-06-27 NOTE — Telephone Encounter (Signed)
I need some more information please:   - How has she determined that she is in A-fib, and is this a change from usual for her? (Did she use a cardiac monitor app or other device?)   - What is her heart rate?   - If she having chest pain or shortness of breath, and has she contacted her cardiologist?  - HD

## 2022-06-27 NOTE — Telephone Encounter (Signed)
Patient is calling states she is in Afib and is not sure what to do about her procedure tomorrow, wishing to speak with somebody. Please advise

## 2022-06-27 NOTE — Telephone Encounter (Signed)
Called and spoke with patient. She states that she has been in A-fib since yesterday evening (patient started holding Xarelto yesterday). Please advise, thanks!

## 2022-06-27 NOTE — Telephone Encounter (Signed)
Called patient back. She stated that "she did not need a monitor, she knows when she is in A-fib'. Pt states that her heart rate early this morning was 134 bpm and then 120 bpm. Sometimes her heart rate drops into the 70s, pt states that the rates go up and down. Pt denies any chest pain or SOB. Pt states that this is not unusual for her. She states that she has not had a chance to reach out to her cardiologist yet. Pt states that she knows what to do when she is in A-fib, she states that it will likely pass after a few days.

## 2022-06-27 NOTE — Telephone Encounter (Signed)
[place], Thank you for the update  She cannot have elective endoscopic procedures with heart rates running as high as she is reporting.  Her procedures for tomorrow must be canceled, she should resume her Xarelto on usual schedule, and contact her cardiologist today by phone.  I will also copy this message to both her main cardiologist and her A-fib cardiologist.  Then I need to hear from her cardiologists when she has been reevaluated and A-fib consistently rate controlled so I can determine the timing of rescheduling these procedures.  - H. Danis  ____________________  Dorene Ar,  Please see above on patient last seen by your team on 05/23/2022.  - HD

## 2022-06-27 NOTE — Telephone Encounter (Signed)
I received a letter from [place] the letter stated that the provider portion was a older version so they are requesting that a new provider portion sign and re sent. I will be faxing the new version to office phone number (fax) [phone] I will put attention to you.  Can you please make sure that the provider received and send back to me [phone]  ATTENTION Theresa Harrington.   Thanks!  Sandre Kitty  Rx Patient Advocate

## 2022-06-27 NOTE — Telephone Encounter (Signed)
Called and left patient a detailed vm with Dr. Loletha Carrow' recommendations as outlined below. I informed patient that her procedures for tomorrow have been cancelled, she has been advised to resume Xarelto at usual dose and contact her cardiologist. Pt has been advised that timing of her procedures will be determined after cardiology evaluation. I advised patient to call back with any questions or concerns.

## 2022-06-28 ENCOUNTER — Encounter: Payer: PPO | Admitting: Gastroenterology

## 2022-06-28 MED ORDER — DILTIAZEM HCL 30 MG PO TABS: ORAL_TABLET | ORAL | 1 refills | Status: DC

## 2022-06-28 NOTE — Telephone Encounter (Signed)
Form given to PCP to sign

## 2022-06-28 NOTE — Addendum Note (Signed)
Addended by: Juluis Mire on: 06/28/2022 09:54 AM   Modules accepted: Orders

## 2022-06-28 NOTE — Telephone Encounter (Signed)
Pt had misunderstood and stopped her nebivolol after her cardiology appt in late January. Instructed pt to restart nebivolol she also has PRN cardizem she can use for rate control. Pt has appt early next week per her request.

## 2022-06-29 ENCOUNTER — Telehealth: Payer: Self-pay | Admitting: Internal Medicine

## 2022-06-29 ENCOUNTER — Encounter: Payer: Self-pay | Admitting: Internal Medicine

## 2022-06-29 NOTE — Telephone Encounter (Signed)
Patient dropped off patient assistance paper work. States that everything has to be new and the MD needs to complete. Call back number when complete is [phone].  Paper work placed in provider box up front.

## 2022-06-30 NOTE — Telephone Encounter (Signed)
Form has been signed and faxed to you

## 2022-07-03 NOTE — Telephone Encounter (Signed)
Received provider portion for [facility])  and resent to company I will follow up on apply next week.  Thanks Sandre Kitty Rx Patient Advocate [phone]

## 2022-07-04 ENCOUNTER — Ambulatory Visit: Payer: PPO | Admitting: Pharmacist

## 2022-07-04 ENCOUNTER — Ambulatory Visit
Admission: RE | Admit: 2022-07-04 | Discharge: 2022-07-04 | Disposition: A | Discharge status: Home / Self Care | Payer: PPO | Source: Ambulatory Visit | Attending: Nurse Practitioner | Admitting: Nurse Practitioner

## 2022-07-04 ENCOUNTER — Encounter (HOSPITAL_COMMUNITY): Payer: Self-pay | Admitting: Nurse Practitioner

## 2022-07-04 VITALS — BP 128/70 | HR 60 | Ht 64.5 in | Wt 168.8 lb

## 2022-07-04 DIAGNOSIS — E785 Hyperlipidemia, unspecified: Secondary | ICD-10-CM | POA: Insufficient documentation

## 2022-07-04 DIAGNOSIS — E118 Type 2 diabetes mellitus with unspecified complications: Secondary | ICD-10-CM | POA: Diagnosis not present

## 2022-07-04 DIAGNOSIS — I4891 Unspecified atrial fibrillation: Secondary | ICD-10-CM | POA: Insufficient documentation

## 2022-07-04 DIAGNOSIS — Z79899 Other long term (current) drug therapy: Secondary | ICD-10-CM | POA: Insufficient documentation

## 2022-07-04 DIAGNOSIS — E039 Hypothyroidism, unspecified: Secondary | ICD-10-CM | POA: Insufficient documentation

## 2022-07-04 DIAGNOSIS — Z794 Long term (current) use of insulin: Secondary | ICD-10-CM | POA: Insufficient documentation

## 2022-07-04 DIAGNOSIS — D6869 Other thrombophilia: Secondary | ICD-10-CM

## 2022-07-04 MED ORDER — ROSUVASTATIN CALCIUM 5 MG PO TABS: 5.0000 mg | ORAL_TABLET | Freq: Every day | ORAL | 3 refills | Status: DC

## 2022-07-04 NOTE — Assessment & Plan Note (Addendum)
Assessment:  LDL-C from 04/04/2022 was 131.  Patient reports never starting Repatha due to cost. Currently taking pravastatin '40mg'$  and ezetimibe '10mg'$ . Discussed LIS. Patient did not believe she would qualify.  Discussed LDL goal of < 55. Patient was amenable to starting a new medication for better control. Patient endorses some exercise: walking for work, stationary bike 3x/wk for ~30 minutes.  Patient reports not eating much, especially meats. Drinks 1-2 Pepsi's per day and tea.   Plan:  Start rosuvastatin '5mg'$  daily  Discontinue pravastatin '40mg'$   Continue ezetimibe '10mg'$  daily Follow-up Wednesday, May 1st for labs.

## 2022-07-04 NOTE — Patient Instructions (Signed)
STOP pravastatin Start rosuvastatin '5mg'$  daily FASTING lab work Wed May 1st Continue ezetimibe '10mg'$  daily

## 2022-07-04 NOTE — Progress Notes (Signed)
Patient ID: Theresa Harrington                 DOB: 01-28-1956                    MRN: [id number]      HPI: Theresa Harrington is a 67 y.o. female patient referred to lipid clinic by Ambrose Pancoast, NP. PMH is significant for atrial fibrillation s/p AF ablation 07/2021, HLD, HTN, hypothyroidism, IDA, PAD s/p femoropopliteal bypass 06/2014, T2DM, barrett's esophagus. Most recent LDL-C is 131 on pravastatin 40 mg.   Bempedoic acid and ezetimibe were discontinued 04/19/2022 and Repatha was started.  Repatha was reported not taking 05/23/2022.   At today's visit, patient reports never taking Repatha due to cost. She is currently taking pravastatin '40mg'$  and ezetimibe '10mg'$ . Patient believes she will not qualify for LIS. Patient is open to starting a different statin medication for better LDL-C control.   Reviewed options for lowering LDL cholesterol, including rosuvastatin.   Current Medications: pravastatin 40 mg Intolerances: atorvastatin '80mg'$  (muscle aches), ezetimibe-simvastatin 10-'40mg'$  (2013- cost concern), pitavastatin 2 mg (2017) Risk Factors: PAD, T2DM, HTN LDL-C goal: < 55 ApoB goal: < 70  Diet: Does not eat a lot, especially meat. Eats eggs for protein. Pepsi (1-2/day) and tea for drinks.   Exercise: Patient reports walking every day for her job. Uses stationary bike 3 days/wk for 30 minutes.   Family History: arthritis, colon cancer (maternal aunt), colon polyps (brother), DVT (father and sister). Diabetes (3 brothers, father, sister), Heart Disease (brother, sister, father), kidney disease (mother)  Social History: Former smoker, quit 2003. No smokeless tobacco. No alcohol use.   Labs: Lipid Panel     Component Value Date/Time   CHOL 223 (H) 04/04/2022 1005   TRIG 192.0 (H) 04/04/2022 1005   HDL 53.40 04/04/2022 1005   CHOLHDL 4 04/04/2022 1005   VLDL 38.4 04/04/2022 1005   LDLCALC 131 (H) 04/04/2022 1005   LDLDIRECT 160.2 03/01/2010 1001    Past Medical History:  Diagnosis Date   Allergy     Anxiety    Pt. denies   Arthritis    right index figer   Asymptomatic cholelithiasis    Atherosclerosis of aorta (HCC)    Cataract    Clotting disorder ([place])    Gallstones 06/2013   GERD (gastroesophageal reflux disease)    Glaucoma    "A LITTLE BIT"   History of kidney stones    lithotrispy   Hyperlipidemia    Hypertension    Hypothyroidism    no meds   Iron deficiency anemia    PAD (peripheral artery disease) (HCC)    PAF (paroxysmal atrial fibrillation) (HCC)    Peripheral arterial occlusive disease (HCC)    lower extremities   Pneumonia    PONV (postoperative nausea and vomiting)    Right ureteral stone    Type 2 diabetes mellitus (HCC)    Type   Vitamin B 12 deficiency    Wears glasses     Current Outpatient Medications on File Prior to Visit  Medication Sig Dispense Refill   ezetimibe (ZETIA) 10 MG tablet Take 10 mg by mouth daily.     Blood Glucose Monitoring Suppl (ONETOUCH VERIO IQ SYSTEM) w/Device KIT USE TO CHECK SUGAR DAILY 1 kit 0   cetirizine (ZYRTEC) 10 MG tablet Take 10 mg by mouth daily as needed for allergies.     chlorpheniramine-HYDROcodone (TUSSIONEX) 10-8 MG/5ML Take 5 mLs by mouth every 12 (twelve) hours as  needed for cough. 115 mL 0   diltiazem (CARDIZEM CD) 120 MG 24 hr capsule Take 1 capsule (120 mg total) by mouth daily. 90 capsule 1   diltiazem (CARDIZEM) 30 MG tablet Take 1 tablet every 4 hours AS NEEDED for AFIB heart rate >100 as long as top BP >100. 30 tablet 1   dorzolamide-timolol (COSOPT) 22.3-6.8 MG/ML ophthalmic solution Place 1 drop into the right eye 2 (two) times daily.      Empagliflozin-metFORMIN HCl ER (SYNJARDY XR) 25-1000 MG TB24 Take 1 tablet by mouth daily. 90 tablet 1   Insulin Glargine (BASAGLAR KWIKPEN) 100 UNIT/ML Inject 50 Units into the skin at bedtime. [place] patient assistance (Patient taking differently: Inject 40-42 Units into the skin at bedtime. [place] patient assistance taking 46 units) 30 mL 2    insulin lispro (HUMALOG KWIKPEN) 100 UNIT/ML KwikPen Inject 14 Units into the skin 3 (three) times daily. 45 mL 1   Insulin Pen Needle (PEN NEEDLES) 32G X 4 MM MISC Inject 1 pen  as directed 4 (four) times daily. Use to inject levemir or novolog as directed. 400 each 3   latanoprost (XALATAN) 0.005 % ophthalmic solution Place 1 drop into both eyes at bedtime.      losartan (COZAAR) 25 MG tablet Take 25 mg by mouth daily.     magnesium oxide (MAG-OX) 400 (240 Mg) MG tablet TAKE ONE TABLET BY MOUTH EVERYDAY AT BEDTIME 90 tablet 1   nebivolol (BYSTOLIC) 10 MG tablet Take 1 tablet (10 mg total) by mouth daily. 90 tablet 3   omeprazole (PRILOSEC) 40 MG capsule TAKE ONE CAPSULE BY MOUTH EVERY MORNING and TAKE ONE CAPSULE BY MOUTH EVERY EVENING 180 capsule 1   ONETOUCH VERIO test strip USE TO check blood glucose TWICE DAILY 200 strip 3   Semaglutide, 1 MG/DOSE, (OZEMPIC, 1 MG/DOSE,) 4 MG/3ML SOPN Inject 1 mg into the skin once a week. [employer] patient assistance 9 mL 1   Vilazodone HCl (VIIBRYD) 10 MG TABS Take 1 tablet (10 mg total) by mouth daily for 7 days. 7 tablet 0   XARELTO 20 MG TABS tablet Take 1 tablet by mouth once daily 90 tablet 0   No current facility-administered medications on file prior to visit.    Allergies  Allergen Reactions   Amlodipine Swelling   Atorvastatin Other (See Comments)    Muscle aches    Ampicillin Nausea And Vomiting and Other (See Comments)   Codeine Nausea And Vomiting    Ask patient   Lisinopril Cough        Penicillins Nausea And Vomiting    Did it involve swelling of the face/tongue/throat, SOB, or low BP? No Did it involve sudden or severe rash/hives, skin peeling, or any reaction on the inside of your mouth or nose? No Did you need to seek medical attention at a hospital or doctor's office? No When did it last happen?      20+ years If all above answers are "NO", may proceed with cephalosporin use.    Januvia [Sitagliptin] Nausea And  Vomiting    Assessment/Plan:  1. Hyperlipidemia -  Hyperlipidemia with target LDL less than 100 Assessment:  LDL-C from 04/04/2022 was 131.  Patient reports never starting Repatha due to cost. Currently taking pravastatin '40mg'$  and ezetimibe '10mg'$ . Discussed LIS. Patient did not believe she would qualify.  Discussed LDL goal of < 55. Patient was amenable to starting a new medication for better control. Patient endorses  some exercise: walking for work, stationary bike 3x/wk for ~30 minutes.  Patient reports not eating much, especially meats. Drinks 1-2 Pepsi's per day and tea.   Plan:  Start rosuvastatin '5mg'$  daily  Discontinue pravastatin '40mg'$   Continue ezetimibe '10mg'$  daily Follow-up Wednesday, May 1st for labs.     Thank you,  Wallene Huh, PharmD Candidate   Ramond Dial, [place].D, BCPS, CPP [facility] A Division of [facility] [address], [place], [place] [address]  Phone: [phone]; Fax: [phone]

## 2022-07-04 NOTE — Progress Notes (Signed)
Primary Care Physician: [name], MD Referring Physician:Dr. Leroy Libman Theresa Harrington is a 67 y.o. female with a h/o afib  with ablation 08/02/21. She had covid end of July and then had  a personal matter that was causing stress and noted onset RVR since Monday. She has doubled up on her Bystolic  but remains in atrial flutter at 117 bpm.  She is not symptomatic with it. BP stable at home. Elevated here. No evidence of fluid rentention.   F/u in afib clinic 07/04/22 as pt wnet into afib prior to prep for colonoscopy. She had mistaking stopped taking her nebivolol when she started taking Cardizem several weeks back. When she returned to both drugs she converted to SR sometime over the weekend. She had stopped her DOAC for a few days but when developed afib resumed last Thursday. She states this the second time she has developed afib days before colonoscopy even   before starting the prep. EKG shows SR.   Today, she denies symptoms of chest pain, shortness of breath, orthopnea, PND, lower extremity edema, dizziness, presyncope, syncope, or neurologic sequela. The patient is tolerating medications without difficulties and is otherwise without complaint today.   Past Medical History:  Diagnosis Date   Allergy    Anxiety    Pt. denies   Arthritis    right index figer   Asymptomatic cholelithiasis    Atherosclerosis of aorta (HCC)    Cataract    Clotting disorder ([place])    Gallstones 06/2013   GERD (gastroesophageal reflux disease)    Glaucoma    "A LITTLE BIT"   History of kidney stones    lithotrispy   Hyperlipidemia    Hypertension    Hypothyroidism    no meds   Iron deficiency anemia    PAD (peripheral artery disease) (HCC)    PAF (paroxysmal atrial fibrillation) (HCC)    Peripheral arterial occlusive disease (HCC)    lower extremities   Pneumonia    PONV (postoperative nausea and vomiting)    Right ureteral stone    Type 2 diabetes mellitus ([place])    Type   Vitamin B 12  deficiency    Wears glasses    Past Surgical History:  Procedure Laterality Date   ABDOMINAL AORTOGRAM W/LOWER EXTREMITY N/A 03/09/2020   Procedure: ABDOMINAL AORTOGRAM W/LOWER EXTREMITY;  Surgeon: Serafina Mitchell, MD;  Location: [place] [facility];  Service: Cardiovascular;  Laterality: N/A;   AMPUTATION Left 07/16/2014   Procedure: LEFT SECOND TOE AMPUTATION;  Surgeon: Serafina Mitchell, MD;  Location: [place];  Service: Vascular;  Laterality: Left;  With Nerve block   ATRIAL FIBRILLATION ABLATION N/A 08/02/2021   Procedure: ATRIAL FIBRILLATION ABLATION;  Surgeon: Constance Haw, MD;  Location: [place] [facility];  Service: Cardiovascular;  Laterality: N/A;   AUGMENTATION MAMMAPLASTY     BELPHAROPTOSIS REPAIR     eyelid lift   BIOPSY  10/14/2018   Procedure: BIOPSY;  Surgeon: Rush Landmark Telford Nab., MD;  Location: [place];  Service: Gastroenterology;;   BIOPSY  08/04/2019   Procedure: BIOPSY;  Surgeon: Irving Copas., MD;  Location: [place];  Service: Gastroenterology;;   CARDIOVERSION N/A 09/19/2019   Procedure: CARDIOVERSION;  Surgeon: Buford Dresser, MD;  Location: [facility];  Service: Cardiovascular;  Laterality: N/A;   COLONOSCOPY     COMBINED AUGMENTATION MAMMAPLASTY AND ABDOMINOPLASTY  2009   W/  BILATERAL  THIGH LIFT   CYSTO/  RIGHT URETERAL STENT PLACEMENT  12/30/2010   CYSTOSCOPY WITH RETROGRADE PYELOGRAM, URETEROSCOPY AND STENT PLACEMENT Right 10/07/2013   Procedure: CYSTOSCOPY WITH RETROGRADE PYELOGRAM, right URETEROSCOPY AND STENT PLACEMENT, stone extraction;  Surgeon: Arvil Persons, MD;  Location: [facility];  Service: Urology;  Laterality: Right;   CYSTOSCOPY WITH STENT PLACEMENT Right 06/04/2013   Procedure: CYSTOSCOPY WITH STENT PLACEMENT;  Surgeon: Franchot Gallo, MD;  Location: [facility];  Service: Urology;  Laterality: Right;   DILATATION & CURETTAGE/HYSTEROSCOPY WITH MYOSURE N/A 04/27/2015   Procedure: DILATATION &  CURETTAGE/HYSTEROSCOPY WITH MYOSURE;  Surgeon: Terrance Mass, MD;  Location: [facility];  Service: Gynecology;  Laterality: N/A;   ENDARTERECTOMY FEMORAL Left 06/08/2014   Procedure: Left Leg Common Femoral and External Iliac  Endartarectomy with patch Angioplasty;  Surgeon: Rosetta Posner, MD;  Location[facility];  Service: Vascular;  Laterality: Left;   ESOPHAGOGASTRODUODENOSCOPY N/A 10/14/2018   Procedure: ESOPHAGOGASTRODUODENOSCOPY (EGD);  Surgeon: Irving Copas., MD;  Location: [place];  Service: Gastroenterology;  Laterality: N/A;   ESOPHAGOGASTRODUODENOSCOPY (EGD) WITH PROPOFOL N/A 11/06/2018   Procedure: ESOPHAGOGASTRODUODENOSCOPY (EGD) WITH PROPOFOL;  Surgeon: Rush Landmark Telford Nab., MD;  [facility];  Service: Gastroenterology;  Laterality: N/A;  RFA   ESOPHAGOGASTRODUODENOSCOPY (EGD) WITH PROPOFOL N/A 02/12/2019   Procedure: ESOPHAGOGASTRODUODENOSCOPY (EGD) WITH PROPOFOL;  Surgeon: Rush Landmark Telford Nab., MD;  [facility];  Service: Gastroenterology;  Laterality: N/A;   ESOPHAGOGASTRODUODENOSCOPY (EGD) WITH PROPOFOL N/A 08/04/2019   Procedure: ESOPHAGOGASTRODUODENOSCOPY (EGD) WITH PROPOFOL;  Surgeon: Rush Landmark Telford Nab., MD;  Location: [place];  Service: Gastroenterology;  Laterality: N/A;  WITH RFA   EXTRACORPOREAL SHOCK WAVE LITHOTRIPSY Right 08-04-2013//   06-23-2013//   01-16-2011   EYE SURGERY Bilateral    cataract   FEMORAL-POPLITEAL BYPASS GRAFT Left 06/08/2014   Procedure: Left Leg Femoral -Popliteal Bypass Graft;  Surgeon: Rosetta Posner, MD;  Location[facility];  Service: Vascular;  Laterality: Left;   FEMORAL-POPLITEAL BYPASS GRAFT Left 06/09/2014   Procedure: Left Femoral and Popliteal Exposure; Left Femoral to Anterior Tibial Bypass Graft using Propaten 41m by 80cm Goretex Graft; Left Tibial Endarterectomy; Left Femoraland Popliteal Thrombectomy ;  Surgeon: VSerafina Mitchell MD;  [place]  Service: Vascular;  Laterality: Left;   GI  RADIOFREQUENCY ABLATION N/A 11/06/2018   Procedure: GI RADIOFREQUENCY ABLATION;  Surgeon: MIrving Copas, MD;  [facility];  Service: Gastroenterology;  Laterality: N/A;   GI RADIOFREQUENCY ABLATION N/A 02/12/2019   Procedure: GI RADIOFREQUENCY ABLATION;  Surgeon: MRush LandmarkGTelford Nab, MD;  [facility];  Service: Gastroenterology;  Laterality: N/A;   GI RADIOFREQUENCY ABLATION N/A 08/04/2019   Procedure: GI RADIOFREQUENCY ABLATION;  Surgeon: MRush LandmarkGTelford Nab, MD;  [place]  Service: Gastroenterology;  Laterality: N/A;   HOLMIUM LASER APPLICATION Right 00000000  Procedure: HOLMIUM LASER APPLICATION;  Surgeon: MArvil Persons MD;  [facility]  Service: Urology;  Laterality: Right;   KIDNEY STONE SURGERY  07/2013   1-2 stones   LAPAROSCOPIC GASTRIC BANDING  05/29/2005   LITHOTRIPSY  2-3 times   LOWER EXTREMITY ANGIOGRAM N/A 06/04/2014   Procedure: LOWER EXTREMITY ANGIOGRAM;  Surgeon: VSerafina Mitchell MD;  [facility];  Service: Cardiovascular;  Laterality: N/A;   ORIF FIFTH METACARPAL FX  RIGHT HAND  04/21/2002   POLYPECTOMY     REVISION AND RE-SITING LAP-BAND PORT  04/08/2010   W/  UPPER EGD   RIGHT KNEE PATELLECTOMY W/ REPAIR OF EXTENSOR MECHANISM  04/14/2002   TEE WITHOUT CARDIOVERSION N/A  12/24/2019   Procedure: TRANSESOPHAGEAL ECHOCARDIOGRAM (TEE);  Surgeon: Sueanne Margarita, MD;  Location: [facility]OSCOPY;  Service: Cardiovascular;  Laterality: N/A;   Toenail removed Left 05/21/2014   2nd toenail-  Dr. Barkley Bruns    Current Outpatient Medications  Medication Sig Dispense Refill   cetirizine (ZYRTEC) 10 MG tablet Take 10 mg by mouth daily as needed for allergies.     diltiazem (CARDIZEM CD) 120 MG 24 hr capsule Take 1 capsule (120 mg total) by mouth daily. 90 capsule 1   diltiazem (CARDIZEM) 30 MG tablet Take 1 tablet every 4 hours AS NEEDED for AFIB heart rate >100 as long as top BP >100. 30 tablet 1    dorzolamide-timolol (COSOPT) 22.3-6.8 MG/ML ophthalmic solution Place 1 drop into the right eye 2 (two) times daily.      Empagliflozin-metFORMIN HCl ER (SYNJARDY XR) 25-1000 MG TB24 Take 1 tablet by mouth daily. 90 tablet 1   ezetimibe (ZETIA) 10 MG tablet Take 10 mg by mouth daily.     Insulin Glargine (BASAGLAR KWIKPEN) 100 UNIT/ML Inject 50 Units into the skin at bedtime. [place] patient assistance (Patient taking differently: Inject 40-42 Units into the skin at bedtime. [place] patient assistance taking 46 units) 30 mL 2   insulin lispro (HUMALOG KWIKPEN) 100 UNIT/ML KwikPen Inject 14 Units into the skin 3 (three) times daily. 45 mL 1   latanoprost (XALATAN) 0.005 % ophthalmic solution Place 1 drop into both eyes at bedtime.      losartan (COZAAR) 25 MG tablet Take 25 mg by mouth daily.     magnesium oxide (MAG-OX) 400 (240 Mg) MG tablet TAKE ONE TABLET BY MOUTH EVERYDAY AT BEDTIME 90 tablet 1   nebivolol (BYSTOLIC) 10 MG tablet Take 1 tablet (10 mg total) by mouth daily. 90 tablet 3   omeprazole (PRILOSEC) 40 MG capsule TAKE ONE CAPSULE BY MOUTH EVERY MORNING and TAKE ONE CAPSULE BY MOUTH EVERY EVENING 180 capsule 1   Semaglutide, 1 MG/DOSE, (OZEMPIC, 1 MG/DOSE,) 4 MG/3ML SOPN Inject 1 mg into the skin once a week. [employer] patient assistance 9 mL 1   XARELTO 20 MG TABS tablet Take 1 tablet by mouth once daily 90 tablet 0   Blood Glucose Monitoring Suppl (ONETOUCH VERIO IQ SYSTEM) w/Device KIT USE TO CHECK SUGAR DAILY 1 kit 0   chlorpheniramine-HYDROcodone (TUSSIONEX) 10-8 MG/5ML Take 5 mLs by mouth every 12 (twelve) hours as needed for cough. 115 mL 0   Insulin Pen Needle (PEN NEEDLES) 32G X 4 MM MISC Inject 1 pen  as directed 4 (four) times daily. Use to inject levemir or novolog as directed. 400 each 3   ONETOUCH VERIO test strip USE TO check blood glucose TWICE DAILY 200 strip 3   rosuvastatin (CRESTOR) 5 MG tablet Take 1 tablet (5 mg total) by mouth daily. (Patient  not taking: Reported on 07/04/2022) 90 tablet 3   Vilazodone HCl (VIIBRYD) 10 MG TABS Take 1 tablet (10 mg total) by mouth daily for 7 days. 7 tablet 0   No current facility-administered medications for this encounter.    Allergies  Allergen Reactions   Amlodipine Swelling   Atorvastatin Other (See Comments)    Muscle aches    Ampicillin Nausea And Vomiting and Other (See Comments)   Codeine Nausea And Vomiting    Ask patient   Lisinopril Cough        Penicillins Nausea And Vomiting    Did it involve swelling of the  face/tongue/throat, SOB, or low BP? No Did it involve sudden or severe rash/hives, skin peeling, or any reaction on the inside of your mouth or nose? No Did you need to seek medical attention at a hospital or doctor's office? No When did it last happen?      20+ years If all above answers are "NO", may proceed with cephalosporin use.    Januvia [Sitagliptin] Nausea And Vomiting    Social History   Socioeconomic History   Marital status: Single    Spouse name: Not on file   Number of children: Not on file   Years of education: Not on file   Highest education level: Not on file  Occupational History   Occupation: Quarry manager: [employer]  Tobacco Use   Smoking status: Former    Packs/day: 1.00    Years: 28.00    Total pack years: 28.00    Types: Cigarettes    Quit date: 09/05/2001    Years since quitting: 20.8    Passive exposure: Never   Smokeless tobacco: Never  Vaping Use   Vaping Use: Never used  Substance and Sexual Activity   Alcohol use: No    Alcohol/week: 0.0 standard drinks of alcohol   Drug use: No   Sexual activity: Yes  Other Topics Concern   Not on file  Social History Narrative   Regular exercise- yes   Social Determinants of Health   Financial Resource Strain: Low Risk  (12/07/2021)   Overall Financial Resource Strain (CARDIA)    Difficulty of Paying Living Expenses: Not hard at all  Food Insecurity: No Food Insecurity  (12/07/2021)   Hunger Vital Sign    Worried About Running Out of Food in the Last Year: Never true    [place] in the Last Year: Never true  Transportation Needs: No Transportation Needs (12/07/2021)   PRAPARE - Hydrologist (Medical): No    Lack of Transportation (Non-Medical): No  Physical Activity: Inactive (12/07/2021)   Exercise Vital Sign    Days of Exercise per Week: 0 days    Minutes of Exercise per Session: 0 min  Stress: No Stress Concern Present (12/07/2021)   [place]    Feeling of Stress : Not at all  Social Connections: Moderately Integrated (12/07/2021)   Social Connection and Isolation Panel [NHANES]    Frequency of Communication with Friends and Family: More than three times a week    Frequency of Social Gatherings with Friends and Family: More than three times a week    Attends Religious Services: More than 4 times per year    Active Member of [employer] or Organizations: Yes    Attends Music therapist: More than 4 times per year    Marital Status: Never married  Intimate Partner Violence: Not At Risk (12/07/2021)   Humiliation, Afraid, Rape, and Kick questionnaire    Fear of Current or Ex-Partner: No    Emotionally Abused: No    Physically Abused: No    Sexually Abused: No    Family History  Problem Relation Age of Onset   Kidney disease Mother    Hyperlipidemia Mother    Other Mother        AAA   and    Amputation   Diabetes Father    Heart disease Father    Deep vein thrombosis Father    Hyperlipidemia Father  Diabetes Sister    Heart disease Sister    Deep vein thrombosis Sister    Hyperlipidemia Sister    Diabetes Brother    Heart disease Brother    Cancer Brother    Hyperlipidemia Brother    Diabetes Brother    Diabetes Brother    Colon polyps Brother    Colon cancer Maternal Aunt    Arthritis Other    Cancer Other        colon    Hypertension Other    Stroke Other    Esophageal cancer Neg Hx    Stomach cancer Neg Hx    Inflammatory bowel disease Neg Hx    Liver disease Neg Hx    Pancreatic cancer Neg Hx    Crohn's disease Neg Hx    Rectal cancer Neg Hx     ROS- All systems are reviewed and negative except as per the HPI above  Physical Exam: Vitals:   07/04/22 1516  BP: 128/70  Pulse: 60  Weight: 76.6 kg  Height: 5' 4.5" (1.638 m)   Wt Readings from Last 3 Encounters:  07/04/22 76.6 kg  06/20/22 75.8 kg  06/07/22 76.7 kg    Labs: Lab Results  Component Value Date   NA 139 04/04/2022   K 4.3 04/04/2022   CL 104 04/04/2022   CO2 28 04/04/2022   GLUCOSE 123 (H) 04/04/2022   BUN 11 04/04/2022   CREATININE 0.70 04/04/2022   CALCIUM 9.2 04/04/2022   MG 1.8 09/04/2019   Lab Results  Component Value Date   INR 1.3 (H) 03/08/2020   Lab Results  Component Value Date   CHOL 223 (H) 04/04/2022   HDL 53.40 04/04/2022   LDLCALC 131 (H) 04/04/2022   TRIG 192.0 (H) 04/04/2022     GEN- The patient is well appearing, alert and oriented x 3 today.   Head- normocephalic, atraumatic Eyes-  Sclera clear, conjunctiva pink Ears- hearing intact Oropharynx- clear Neck- supple, no JVP Lymph- no cervical lymphadenopathy Lungs- Clear to ausculation bilaterally, normal work of breathing Heart- Regular rate and rhythm, no murmurs, rubs or gallops, PMI not laterally displaced GI- soft, NT, ND, + BS Extremities- no clubbing, cyanosis, or edema MS- no significant deformity or atrophy Skin- no rash or lesion Psych- euthymic mood, full affect Neuro- strength and sensation are intact  EKG- Vent. rate 60 BPM PR interval 158 ms QRS duration 72 ms QT/QTcB 454/454 ms P-R-T axes 59 45 32 Normal sinus rhythm Normal ECG When compared with ECG of 14-Dec-2021 10:39, PREVIOUS ECG IS PRESENT    Assessment and Plan:  1. Atrial fib Recent episode of afib when she was scheduled to start prep for  colonoscopy She had by accident stopped nebivolol when she started Cardizem a few weeks earlier which may have been the trigger. She is now on both drugs and went back into SR over the weekend.  She should be able to be rescheduled for colonoscopy but should stay on rate control meds thru the prep and procedure     2. CHA2DS2VASc  score of 3 Continue xarelto 20 mg daily  She will need to hold for 2 days prior to colonoscopy and restart ASAP after procedure  3. HTN Stable    F/u in afib clinic as needed   Theresa Harrington, [facility] [address] [place], [place] [address] [phone]

## 2022-07-05 ENCOUNTER — Telehealth: Payer: Self-pay | Admitting: Gastroenterology

## 2022-07-05 NOTE — Telephone Encounter (Signed)
This patient with A-fib was recently scheduled for an EGD and colonoscopy with me, but it was canceled because she reported being in A-fib with rapid heart rate.  I received a note from the A-fib clinic, and it indicates the patient had mistakenly stopped her beta-blocker (Bystolic) a couple of weeks prior, which may have contributed to the A-fib episode. By the time she was seen in A-fib clinic, she had converted to sinus rhythm.  Cardiology says that Theresa Harrington can be rescheduled for her procedures with no further cardiac testing, and she will remain on Bystolic as prescribed without interruption.  Please reschedule her EGD and colonoscopy with me with same prep instructions that we were planning to use recently. Hold Xarelto 2 days prior to procedure (also authorized in the cardiology office note).  - HD

## 2022-07-05 NOTE — Telephone Encounter (Signed)
Called and spoke with patient to reschedule ECL since she has been cleared. Pt has been rescheduled to Tuesday, 08/01/22 at 3 pm. Pt has been advised to arrive in the [facility] by 2 pm with a care partner. Pt already has her prep, she understands that I will send updated prep instructions and a copy will be mailed. Pt verbalized understanding and had no concerns at the end of the call.   Updated colonoscopy instructions mailed and sent to patient via [place].

## 2022-07-06 NOTE — Telephone Encounter (Signed)
Updated instructions mailed and sent via [place].

## 2022-07-06 NOTE — Telephone Encounter (Signed)
Inbound call from patient, stated she wished to reschedule her appoinment, patient wants a Wednesday appointment. Patient rescheduled for 4/10 at 11:00 AM. She is also requesting updated prep instructions sent and mailed.

## 2022-07-12 ENCOUNTER — Other Ambulatory Visit: Payer: Self-pay | Admitting: Internal Medicine

## 2022-07-12 DIAGNOSIS — I10 Essential (primary) hypertension: Secondary | ICD-10-CM

## 2022-07-19 NOTE — Telephone Encounter (Signed)
Received notification from [place] regarding approval for [place]. Patient assistance approved from 07/11/2022 to 05/01/2023.  Phone: [phone]     Approval letter has been scanned in to media in chart  [place] [phone]

## 2022-07-26 ENCOUNTER — Telehealth: Payer: Self-pay | Admitting: Internal Medicine

## 2022-07-26 DIAGNOSIS — I4891 Unspecified atrial fibrillation: Secondary | ICD-10-CM

## 2022-07-26 MED ORDER — DILTIAZEM HCL ER COATED BEADS 120 MG PO CP24: 120.0000 mg | ORAL_CAPSULE | Freq: Every day | ORAL | 0 refills | Status: DC

## 2022-07-26 NOTE — Telephone Encounter (Signed)
Prescription Request  07/26/2022  LOV: 06/07/2022  What is the name of the medication or equipment? diltiazem (CARDIZEM CD) 120 MG 24 hr capsule   Have you contacted your pharmacy to request a refill? No   Which pharmacy would you like this sent to?  [address] [place] [address] Phone: [phone] Fax: [phone]    Patient notified that their request is being sent to the clinical staff for review and that they should receive a response within 2 business days.   Please advise at Mobile [phone] (mobile)

## 2022-08-01 ENCOUNTER — Encounter: Payer: PPO | Admitting: Gastroenterology

## 2022-08-09 ENCOUNTER — Encounter: Payer: Self-pay | Admitting: Gastroenterology

## 2022-08-09 ENCOUNTER — Ambulatory Visit (AMBULATORY_SURGERY_CENTER): Payer: PPO | Admitting: Gastroenterology

## 2022-08-09 VITALS — BP 149/57 | HR 56 | Temp 97.3°F | Resp 19 | Ht 64.0 in | Wt 167.0 lb

## 2022-08-09 DIAGNOSIS — D122 Benign neoplasm of ascending colon: Secondary | ICD-10-CM | POA: Diagnosis not present

## 2022-08-09 DIAGNOSIS — Z09 Encounter for follow-up examination after completed treatment for conditions other than malignant neoplasm: Secondary | ICD-10-CM

## 2022-08-09 DIAGNOSIS — Z860100 Personal history of colon polyps, unspecified: Secondary | ICD-10-CM

## 2022-08-09 DIAGNOSIS — K22711 Barrett's esophagus with high grade dysplasia: Secondary | ICD-10-CM

## 2022-08-09 DIAGNOSIS — I10 Essential (primary) hypertension: Secondary | ICD-10-CM | POA: Diagnosis not present

## 2022-08-09 DIAGNOSIS — D123 Benign neoplasm of transverse colon: Secondary | ICD-10-CM

## 2022-08-09 DIAGNOSIS — E119 Type 2 diabetes mellitus without complications: Secondary | ICD-10-CM | POA: Diagnosis not present

## 2022-08-09 MED ORDER — SODIUM CHLORIDE 0.9 % IV SOLN: 500.0000 mL | Freq: Once | INTRAVENOUS | Status: DC

## 2022-08-09 NOTE — Progress Notes (Signed)
Called to room to assist during endoscopic procedure.  Patient ID and intended procedure confirmed with present staff. Received instructions for my participation in the procedure from the performing physician.  

## 2022-08-09 NOTE — Progress Notes (Signed)
Pt's states no medical or surgical changes since previsit or office visit. 

## 2022-08-09 NOTE — Progress Notes (Signed)
History and Physical:  This patient presents for endoscopic testing for: Encounter Diagnoses  Name Primary?   Barrett's esophagus with high grade dysplasia Yes   Personal history of colonic polyps     67 year old woman well-known to me here for upper endoscopy and colonoscopy.  She has a history of Barrett's esophagus with high-grade dysplasia that underwent endoscopic ablation (Dr. Meridee Score), and will be examined today to assess for any recurrence of Barrett's.  She also has a history of multiple adenomatous colon polyps on last colonoscopy March 2020.  Atrial fibrillation on oral anticoagulation, medicine which has been held briefly for this procedure. More clinical details in my 04/19/22 office note.  Patient is otherwise without complaints or active issues today.   Past Medical History: Past Medical History:  Diagnosis Date   Allergy    Anxiety    Pt. denies   Arthritis    right index figer   Asymptomatic cholelithiasis    Atherosclerosis of aorta    Cataract    Clotting disorder    Gallstones 06/2013   GERD (gastroesophageal reflux disease)    Glaucoma    "A LITTLE BIT"   History of kidney stones    lithotrispy   Hyperlipidemia    Hypertension    Hypothyroidism    no meds   Iron deficiency anemia    PAD (peripheral artery disease)    PAF (paroxysmal atrial fibrillation)    Peripheral arterial occlusive disease    lower extremities   Pneumonia    PONV (postoperative nausea and vomiting)    Right ureteral stone    Type 2 diabetes mellitus    Type   Vitamin B 12 deficiency    Wears glasses      Past Surgical History: Past Surgical History:  Procedure Laterality Date   ABDOMINAL AORTOGRAM W/LOWER EXTREMITY N/A 03/09/2020   Procedure: ABDOMINAL AORTOGRAM W/LOWER EXTREMITY;  Surgeon: Nada Libman, MD;  Location: [facility];  Service: Cardiovascular;  Laterality: N/A;   AMPUTATION Left 07/16/2014   Procedure: LEFT SECOND TOE AMPUTATION;   Surgeon: Nada Libman, MD;  Location: [facility] OR;  Service: Vascular;  Laterality: Left;  With Nerve block   ATRIAL FIBRILLATION ABLATION N/A 08/02/2021   Procedure: ATRIAL FIBRILLATION ABLATION;  Surgeon: Regan Lemming, MD;  Location: [facility];  Service: Cardiovascular;  Laterality: N/A;   AUGMENTATION MAMMAPLASTY     BELPHAROPTOSIS REPAIR     eyelid lift   BIOPSY  10/14/2018   Procedure: BIOPSY;  Surgeon: Meridee Score Netty Starring., MD;  Location: [facility] ENDOSCOPY;  Service: Gastroenterology;;   BIOPSY  08/04/2019   Procedure: BIOPSY;  Surgeon: Lemar Lofty., MD;  Location: [facility] ENDOSCOPY;  Service: Gastroenterology;;   CARDIOVERSION N/A 09/19/2019   Procedure: CARDIOVERSION;  Surgeon: Jodelle Red, MD;  Location: [facility] ENDOSCOPY;  Service: Cardiovascular;  Laterality: N/A;   COLONOSCOPY     COMBINED AUGMENTATION MAMMAPLASTY AND ABDOMINOPLASTY  2009   W/  BILATERAL  THIGH LIFT   CYSTO/  RIGHT URETERAL STENT PLACEMENT  12/30/2010   CYSTOSCOPY WITH RETROGRADE PYELOGRAM, URETEROSCOPY AND STENT PLACEMENT Right 10/07/2013   Procedure: CYSTOSCOPY WITH RETROGRADE PYELOGRAM, right URETEROSCOPY AND STENT PLACEMENT, stone extraction;  Surgeon: Danae Chen, MD;  Location: [facility];  Service: Urology;  Laterality: Right;   CYSTOSCOPY WITH STENT PLACEMENT Right 06/04/2013   Procedure: CYSTOSCOPY WITH STENT PLACEMENT;  Surgeon: Marcine Matar, MD;  Location: [facility] ORS;  Service: Urology;  Laterality: Right;   DILATATION & CURETTAGE/HYSTEROSCOPY  WITH MYOSURE N/A 04/27/2015   Procedure: DILATATION & CURETTAGE/HYSTEROSCOPY WITH MYOSURE;  Surgeon: Ok Edwards, MD;  Location: [facility] ORS;  Service: Gynecology;  Laterality: N/A;   ENDARTERECTOMY FEMORAL Left 06/08/2014   Procedure: Left Leg Common Femoral and External Iliac  Endartarectomy with patch Angioplasty;  Surgeon: Larina Earthly, MD;  Location: [facility] OR;  Service: Vascular;  Laterality: Left;    ESOPHAGOGASTRODUODENOSCOPY N/A 10/14/2018   Procedure: ESOPHAGOGASTRODUODENOSCOPY (EGD);  Surgeon: Lemar Lofty., MD;  Location: [facility] ENDOSCOPY;  Service: Gastroenterology;  Laterality: N/A;   ESOPHAGOGASTRODUODENOSCOPY (EGD) WITH PROPOFOL N/A 11/06/2018   Procedure: ESOPHAGOGASTRODUODENOSCOPY (EGD) WITH PROPOFOL;  Surgeon: Meridee Score Netty Starring., MD;  Location: [facility] ENDOSCOPY;  Service: Gastroenterology;  Laterality: N/A;  RFA   ESOPHAGOGASTRODUODENOSCOPY (EGD) WITH PROPOFOL N/A 02/12/2019   Procedure: ESOPHAGOGASTRODUODENOSCOPY (EGD) WITH PROPOFOL;  Surgeon: Meridee Score Netty Starring., MD;  Location: [facility] ENDOSCOPY;  Service: Gastroenterology;  Laterality: N/A;   ESOPHAGOGASTRODUODENOSCOPY (EGD) WITH PROPOFOL N/A 08/04/2019   Procedure: ESOPHAGOGASTRODUODENOSCOPY (EGD) WITH PROPOFOL;  Surgeon: Meridee Score Netty Starring., MD;  Location: [facility] ENDOSCOPY;  Service: Gastroenterology;  Laterality: N/A;  WITH RFA   EXTRACORPOREAL SHOCK WAVE LITHOTRIPSY Right 08-04-2013//   06-23-2013//   01-16-2011   EYE SURGERY Bilateral    cataract   FEMORAL-POPLITEAL BYPASS GRAFT Left 06/08/2014   Procedure: Left Leg Femoral -Popliteal Bypass Graft;  Surgeon: Larina Earthly, MD;  Location: [facility] OR;  Service: Vascular;  Laterality: Left;   FEMORAL-POPLITEAL BYPASS GRAFT Left 06/09/2014   Procedure: Left Femoral and Popliteal Exposure; Left Femoral to Anterior Tibial Bypass Graft using Propaten 6mm by 80cm Goretex Graft; Left Tibial Endarterectomy; Left Femoraland Popliteal Thrombectomy ;  Surgeon: Nada Libman, MD;  Location: [facility] OR;  Service: Vascular;  Laterality: Left;   GI RADIOFREQUENCY ABLATION N/A 11/06/2018   Procedure: GI RADIOFREQUENCY ABLATION;  Surgeon: Lemar Lofty., MD;  Location: [facility] ENDOSCOPY;  Service: Gastroenterology;  Laterality: N/A;   GI RADIOFREQUENCY ABLATION N/A 02/12/2019   Procedure: GI RADIOFREQUENCY ABLATION;  Surgeon: Meridee Score Netty Starring., MD;  Location: [facility] ENDOSCOPY;  Service:  Gastroenterology;  Laterality: N/A;   GI RADIOFREQUENCY ABLATION N/A 08/04/2019   Procedure: GI RADIOFREQUENCY ABLATION;  Surgeon: Meridee Score Netty Starring., MD;  Location: [facility] ENDOSCOPY;  Service: Gastroenterology;  Laterality: N/A;   HOLMIUM LASER APPLICATION Right 10/07/2013   Procedure: HOLMIUM LASER APPLICATION;  Surgeon: Danae Chen, MD;  Location: [facility];  Service: Urology;  Laterality: Right;   KIDNEY STONE SURGERY  07/2013   1-2 stones   LAPAROSCOPIC GASTRIC BANDING  05/29/2005   LITHOTRIPSY  2-3 times   LOWER EXTREMITY ANGIOGRAM N/A 06/04/2014   Procedure: LOWER EXTREMITY ANGIOGRAM;  Surgeon: Nada Libman, MD;  Location: [facility] CATH LAB;  Service: Cardiovascular;  Laterality: N/A;   ORIF FIFTH METACARPAL FX  RIGHT HAND  04/21/2002   POLYPECTOMY     REVISION AND RE-SITING LAP-BAND PORT  04/08/2010   W/  UPPER EGD   RIGHT KNEE PATELLECTOMY W/ REPAIR OF EXTENSOR MECHANISM  04/14/2002   TEE WITHOUT CARDIOVERSION N/A 12/24/2019   Procedure: TRANSESOPHAGEAL ECHOCARDIOGRAM (TEE);  Surgeon: Quintella Reichert, MD;  Location: [facility] ENDOSCOPY;  Service: Cardiovascular;  Laterality: N/A;   Toenail removed Left 05/21/2014   2nd toenail-  Dr. Elvin So    Allergies: Allergies  Allergen Reactions   Amlodipine Swelling   Atorvastatin Other (See Comments)    Muscle aches    Ampicillin Nausea And Vomiting and Other (See Comments)   Codeine Nausea And Vomiting    Ask  patient   Lisinopril Cough        Penicillins Nausea And Vomiting    Did it involve swelling of the face/tongue/throat, SOB, or low BP? No Did it involve sudden or severe rash/hives, skin peeling, or any reaction on the inside of your mouth or nose? No Did you need to seek medical attention at a hospital or doctor's office? No When did it last happen?      20+ years If all above answers are "NO", may proceed with cephalosporin use.    Januvia [Sitagliptin] Nausea And Vomiting    Outpatient Meds: Current  Outpatient Medications  Medication Sig Dispense Refill   Blood Glucose Monitoring Suppl (ONETOUCH VERIO IQ SYSTEM) w/Device KIT USE TO CHECK SUGAR DAILY 1 kit 0   diltiazem (CARDIZEM CD) 120 MG 24 hr capsule Take 1 capsule (120 mg total) by mouth daily. 90 capsule 0   dorzolamide-timolol (COSOPT) 22.3-6.8 MG/ML ophthalmic solution Place 1 drop into the right eye 2 (two) times daily.      Empagliflozin-metFORMIN HCl ER (SYNJARDY XR) 25-1000 MG TB24 Take 1 tablet by mouth daily. 90 tablet 1   ezetimibe (ZETIA) 10 MG tablet Take 10 mg by mouth daily.     hydrALAZINE (APRESOLINE) 50 MG tablet Take by mouth.     Insulin Glargine (BASAGLAR KWIKPEN) 100 UNIT/ML Inject 50 Units into the skin at bedtime. Via [employer] patient assistance (Patient taking differently: Inject 40-42 Units into the skin at bedtime. Via [employer] patient assistance taking 46 units) 30 mL 2   insulin lispro (HUMALOG KWIKPEN) 100 UNIT/ML KwikPen Inject 14 Units into the skin 3 (three) times daily. 45 mL 1   Insulin Pen Needle (PEN NEEDLES) 32G X 4 MM MISC Inject 1 pen  as directed 4 (four) times daily. Use to inject levemir or novolog as directed. 400 each 3   latanoprost (XALATAN) 0.005 % ophthalmic solution Place 1 drop into both eyes at bedtime.      losartan (COZAAR) 25 MG tablet Take 25 mg by mouth daily.     magnesium oxide (MAG-OX) 400 (240 Mg) MG tablet TAKE ONE TABLET BY MOUTH EVERYDAY AT BEDTIME 90 tablet 1   nebivolol (BYSTOLIC) 10 MG tablet Take 1 tablet (10 mg total) by mouth daily. 90 tablet 3   omeprazole (PRILOSEC) 40 MG capsule TAKE ONE CAPSULE BY MOUTH EVERY MORNING and TAKE ONE CAPSULE BY MOUTH EVERY EVENING 180 capsule 1   ONETOUCH VERIO test strip USE TO check blood glucose TWICE DAILY 200 strip 3   rosuvastatin (CRESTOR) 5 MG tablet Take 1 tablet (5 mg total) by mouth daily. 90 tablet 3   cetirizine (ZYRTEC) 10 MG tablet Take 10 mg by mouth daily as needed for allergies.     chlorpheniramine-HYDROcodone  (TUSSIONEX) 10-8 MG/5ML Take 5 mLs by mouth every 12 (twelve) hours as needed for cough. 115 mL 0   diltiazem (CARDIZEM) 30 MG tablet Take 1 tablet every 4 hours AS NEEDED for AFIB heart rate >100 as long as top BP >100. 30 tablet 1   Semaglutide, 1 MG/DOSE, (OZEMPIC, 1 MG/DOSE,) 4 MG/3ML SOPN Inject 1 mg into the skin once a week. Via [employer] patient assistance 9 mL 1   Vilazodone HCl (VIIBRYD) 10 MG TABS Take 1 tablet (10 mg total) by mouth daily for 7 days. 7 tablet 0   XARELTO 20 MG TABS tablet Take 1 tablet by mouth once daily 90 tablet 0   Current Facility-Administered Medications  Medication Dose  Route Frequency Provider Last Rate Last Admin   0.9 %  sodium chloride infusion  500 mL Intravenous Once Danis, Starr LakeHenry L III, MD          ___________________________________________________________________ Objective   Exam:  BP (!) 121/57   Pulse (!) 58   Temp (!) 97.3 F (36.3 C)   Ht 5\' 4"  (1.626 m)   Wt 167 lb (75.8 kg)   LMP  (LMP Unknown)   SpO2 99%   BMI 28.67 kg/m   CV: regular , S1/S2 Resp: clear to auscultation bilaterally, normal RR and effort noted GI: soft, no tenderness, with active bowel sounds.   Assessment: Encounter Diagnoses  Name Primary?   Barrett's esophagus with high grade dysplasia Yes   Personal history of colonic polyps      Plan: Colonoscopy EGD  The benefits and risks of the planned procedure were described in detail with the patient or (when appropriate) their health care proxy.  Risks were outlined as including, but not limited to, bleeding, infection, perforation, adverse medication reaction leading to cardiac or pulmonary decompensation, pancreatitis (if ERCP).  The limitation of incomplete mucosal visualization was also discussed.  No guarantees or warranties were given.    The patient is appropriate for an endoscopic procedure in the ambulatory setting.   - Amada JupiterHenry Danis, MD

## 2022-08-09 NOTE — Op Note (Addendum)
[facility] Patient Name: Theresa CowboyMimi Fujimoto Procedure Date: 08/09/2022 10:04 AM MRN: [id number] Endoscopist: Sherilyn CooterHenry L. Myrtie Neitheranis , MD, [phone] Age: 67 Referring MD:  Date of Birth: 09/24/1955 Gender: Female Account #: [id number] Procedure:                Upper GI endoscopy Indications:              Follow-up of previous ablation treatment of                            Barrett's esophagus with HGD                           Ablations in 2020, no recurrence in 2021 Medicines:                Monitored Anesthesia Care Procedure:                Pre-Anesthesia Assessment:                           - Prior to the procedure, a History and Physical                            was performed, and patient medications and                            allergies were reviewed. The patient's tolerance of                            previous anesthesia was also reviewed. The risks                            and benefits of the procedure and the sedation                            options and risks were discussed with the patient.                            All questions were answered, and informed consent                            was obtained. Prior Anticoagulants: The patient has                            taken Xarelto (rivaroxaban), last dose was 2 days                            prior to procedure. ASA Grade Assessment: III - A                            patient with severe systemic disease. After                            reviewing the risks and benefits, the patient was  deemed in satisfactory condition to undergo the                            procedure.                           After obtaining informed consent, the endoscope was                            passed under direct vision. Throughout the                            procedure, the patient's blood pressure, pulse, and                            oxygen saturations were monitored continuously. The                             Olympus Scope [id number] was introduced through the                            mouth, and advanced to the second part of duodenum.                            The upper GI endoscopy was accomplished without                            difficulty. The patient tolerated the procedure                            well. Scope In: Scope Out: Findings:                 There is no endoscopic evidence of Barrett's                            esophagus, esophagitis, mucosal abnormalities or                            stricture in the entire esophagus.                           Extrinsic compression on the stomach from a LapBand                            was found in the cardia.                           The exam of the stomach was otherwise normal.                           The examined duodenum was normal. Complications:            No immediate complications. Estimated Blood Loss:     Estimated blood loss: none. Impression:               - Extrinsic  compression in the cardia.                           - Normal examined duodenum.                           - No specimens collected. Recommendation:           - Patient has a contact number available for                            emergencies. The signs and symptoms of potential                            delayed complications were discussed with the                            patient. Return to normal activities tomorrow.                            Written discharge instructions were provided to the                            patient.                           - Resume previous diet.                           - Resume Xarelto (rivaroxaban) at prior dose                            tomorrow.                           - Repeat upper endoscopy in 3 years for                            surveillance.                           - See the other procedure note for documentation of                            additional recommendations. Tyisha Cressy L. Myrtie Neither,  MD 08/09/2022 10:48:34 AM This report has been signed electronically.

## 2022-08-09 NOTE — Op Note (Addendum)
[facility] Patient Name: Theresa CowboyMimi Otero Procedure Date: 08/09/2022 10:03 AM MRN: [id number] Endoscopist: Sherilyn CooterHenry L. Myrtie Neitheranis , MD, [phone] Age: [age] Referring MD:  Date of Birth: [birth year] Gender: Female Account #: [id number] Procedure:                Colonoscopy Indications:              Surveillance: Personal history of adenomatous                            polyps on last colonoscopy > 3 years ago                           TA x 4 (one > 10mm) Feb 2020 Medicines:                Monitored Anesthesia Care Procedure:                Pre-Anesthesia Assessment:                           - Prior to the procedure, a History and Physical                            was performed, and patient medications and                            allergies were reviewed. The patient's tolerance of                            previous anesthesia was also reviewed. The risks                            and benefits of the procedure and the sedation                            options and risks were discussed with the patient.                            All questions were answered, and informed consent                            was obtained. Prior Anticoagulants: The patient has                            taken Xarelto (rivaroxaban), last dose was 2 days                            prior to procedure. ASA Grade Assessment: III - A                            patient with severe systemic disease. After                            reviewing the risks and benefits, the patient was  deemed in satisfactory condition to undergo the                            procedure.                           After obtaining informed consent, the colonoscope                            was passed under direct vision. Throughout the                            procedure, the patient's blood pressure, pulse, and                            oxygen saturations were monitored continuously. The                             Olympus CF-[id number] ([id number]) Colonoscope was                            introduced through the anus and advanced to the the                            cecum, identified by appendiceal orifice and                            ileocecal valve. The colonoscopy was performed                            without difficulty. The patient tolerated the                            procedure well. The quality of the bowel                            preparation was good. The ileocecal valve,                            appendiceal orifice, and rectum were photographed. Scope In: 10:24:15 AM Scope Out: 10:39:06 AM Scope Withdrawal Time: 0 hours 10 minutes 10 seconds  Total Procedure Duration: 0 hours 14 minutes 51 seconds  Findings:                 The digital rectal exam findings include decreased                            sphincter tone.                           There was a lipoma, at the ileocecal valve.                           Repeat examination of right colon under NBI  performed.                           Three sessile polyps were found in the transverse                            colon, hepatic flexure and proximal ascending                            colon. The polyps were 3 to 6 mm in size. These                            polyps were removed with a cold snare. Resection                            and retrieval were complete.                           Multiple diverticula were found in the left colon.                           The exam was otherwise without abnormality on                            direct and retroflexion views. Complications:            No immediate complications. Estimated Blood Loss:     Estimated blood loss was minimal. Impression:               - Decreased sphincter tone found on digital rectal                            exam.                           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                           - Three 3 to 6 mm polyps  in the transverse colon,                            at the hepatic flexure and in the proximal                            ascending colon, removed with a cold snare.                            Resected and retrieved.                           - Diverticulosis in the left colon.                           - The examination was otherwise normal on direct  and retroflexion views. Recommendation:           - Patient has a contact number available for                            emergencies. The signs and symptoms of potential                            delayed complications were discussed with the                            patient. Return to normal activities tomorrow.                            Written discharge instructions were provided to the                            patient.                           - Resume previous diet.                           - Resume Xarelto (rivaroxaban) at prior dose                            tomorrow.                           - Await pathology results.                           - Repeat colonoscopy in 3 years for surveillance.                           - See the other procedure note for documentation of                            additional recommendations. Binnie Vonderhaar L. Myrtie Neither, MD 08/09/2022 10:52:21 AM This report has been signed electronically.

## 2022-08-09 NOTE — Progress Notes (Signed)
Report to PACU, RN, vss, BBS= Clear.  

## 2022-08-09 NOTE — Patient Instructions (Addendum)
Recommendation:  - Patient has a contact number available for emergencies.  The signs and symptoms of potential delayed complications were discussed with the patient.  Return to normal activities tomorrow.  Written discharge instructions were provided to the patient.  - Resume previous diet.  - Resume Xarelto (rivaroxaban) at prior dose tomorrow.  - Repeat upper endoscopy in 3 years for surveillance.    YOU HAD AN ENDOSCOPIC PROCEDURE TODAY AT THE [facility]:   Refer to the procedure report that was given to you for any specific questions about what was found during the examination.  If the procedure report does not answer your questions, please call your gastroenterologist to clarify.  If you requested that your care partner not be given the details of your procedure findings, then the procedure report has been included in a sealed envelope for you to review at your convenience later.  YOU SHOULD EXPECT: Some feelings of bloating in the abdomen. Passage of more gas than usual.  Walking can help get rid of the air that was put into your GI tract during the procedure and reduce the bloating. If you had a lower endoscopy (such as a colonoscopy or flexible sigmoidoscopy) you may notice spotting of blood in your stool or on the toilet paper. If you underwent a bowel prep for your procedure, you may not have a normal bowel movement for a few days.  Please Note:  You might notice some irritation and congestion in your nose or some drainage.  This is from the oxygen used during your procedure.  There is no need for concern and it should clear up in a day or so.  SYMPTOMS TO REPORT IMMEDIATELY:  Following lower endoscopy (colonoscopy or flexible sigmoidoscopy):  Excessive amounts of blood in the stool  Significant tenderness or worsening of abdominal pains  Swelling of the abdomen that is new, acute  Fever of 100F or higher  Following upper endoscopy (EGD)  Vomiting of blood or coffee  ground material  New chest pain or pain under the shoulder blades  Painful or persistently difficult swallowing  New shortness of breath  Fever of 100F or higher  Black, tarry-looking stools  For urgent or emergent issues, a gastroenterologist can be reached at any hour by calling [phone]. Do not use MyChart messaging for urgent concerns.    DIET:  We do recommend a small meal at first, but then you may proceed to your regular diet.  Drink plenty of fluids but you should avoid alcoholic beverages for 24 hours.  MEDICATIONS: Resume present medications. Resume Xarelto (rivaroxaban) at prior dose tomorrow.  Please see handouts given to you by your recovery nurse: Polyps, Diverticulosis.  FOLLOW UP: Await pathology results. Repeat upper endoscopy in 3 years for surveillance. Repeat colonoscopy in 3 years for surveillance.  Thank you for allowing [place] to provide for your healthcare needs today.  ACTIVITY:  You should plan to take it easy for the rest of today and you should NOT DRIVE or use heavy machinery until tomorrow (because of the sedation medicines used during the test).    FOLLOW UP: Our staff will call the number listed on your records the next business day following your procedure.  We will call around 7:15- 8:00 am to check on you and address any questions or concerns that you may have regarding the information given to you following your procedure. If we do not reach you, we will leave a message.     If any  biopsies were taken you will be contacted by phone or by letter within the next 1-3 weeks.  Please call us at [phone] if you have not heard about the biopsies in 3 weeks.    SIGNATURES/CONFIDENTIALITY: You and/or your care partner have signed paperwork which will be entered into your electronic medical record.  These signatures attest to the fact that that the information above on your After Visit Summary has been reviewed and is understood.  Full responsibility  of the confidentiality of this discharge information lies with you and/or your care-partner.

## 2022-08-10 ENCOUNTER — Telehealth: Payer: Self-pay

## 2022-08-10 NOTE — Telephone Encounter (Signed)
  Follow up Call-     08/09/2022    9:42 AM  Call back number  Post procedure Call Back phone  # [phone]  Permission to leave phone message Yes    Post op call attempted, no answer, left WM.

## 2022-08-11 ENCOUNTER — Encounter: Payer: Self-pay | Admitting: Gastroenterology

## 2022-08-22 NOTE — Progress Notes (Unsigned)
Office Visit    Patient Name: Theresa Harrington Date of Encounter: 08/22/2022  Primary Care Provider:  Etta Grandchild, MD Primary Cardiologist:  Donato Schultz, MD Primary Electrophysiologist: Will Jorja Loa, MD   Past Medical History    Past Medical History:  Diagnosis Date   Allergy    Anxiety    Pt. denies   Arthritis    right index figer   Asymptomatic cholelithiasis    Atherosclerosis of aorta    Cataract    Clotting disorder    Gallstones 06/2013   GERD (gastroesophageal reflux disease)    Glaucoma    "A LITTLE BIT"   History of kidney stones    lithotrispy   Hyperlipidemia    Hypertension    Hypothyroidism    no meds   Iron deficiency anemia    PAD (peripheral artery disease)    PAF (paroxysmal atrial fibrillation)    Peripheral arterial occlusive disease    lower extremities   Pneumonia    PONV (postoperative nausea and vomiting)    Right ureteral stone    Type 2 diabetes mellitus    Type   Vitamin B 12 deficiency    Wears glasses    Past Surgical History:  Procedure Laterality Date   ABDOMINAL AORTOGRAM W/LOWER EXTREMITY N/A 03/09/2020   Procedure: ABDOMINAL AORTOGRAM W/LOWER EXTREMITY;  Surgeon: Nada Libman, MD;  Location: [facility];  Service: Cardiovascular;  Laterality: N/A;   AMPUTATION Left 07/16/2014   Procedure: LEFT SECOND TOE AMPUTATION;  Surgeon: Nada Libman, MD;  Location: [facility] OR;  Service: Vascular;  Laterality: Left;  With Nerve block   ATRIAL FIBRILLATION ABLATION N/A 08/02/2021   Procedure: ATRIAL FIBRILLATION ABLATION;  Surgeon: Regan Lemming, MD;  Location: [facility];  Service: Cardiovascular;  Laterality: N/A;   AUGMENTATION MAMMAPLASTY     BELPHAROPTOSIS REPAIR     eyelid lift   BIOPSY  10/14/2018   Procedure: BIOPSY;  Surgeon: Meridee Score Netty Starring., MD;  Location: [facility] ENDOSCOPY;  Service: Gastroenterology;;   BIOPSY  08/04/2019   Procedure: BIOPSY;  Surgeon: Lemar Lofty., MD;   Location: [facility] ENDOSCOPY;  Service: Gastroenterology;;   CARDIOVERSION N/A 09/19/2019   Procedure: CARDIOVERSION;  Surgeon: Jodelle Red, MD;  Location: [facility] ENDOSCOPY;  Service: Cardiovascular;  Laterality: N/A;   COLONOSCOPY     COMBINED AUGMENTATION MAMMAPLASTY AND ABDOMINOPLASTY  2009   W/  BILATERAL  THIGH LIFT   CYSTO/  RIGHT URETERAL STENT PLACEMENT  12/30/2010   CYSTOSCOPY WITH RETROGRADE PYELOGRAM, URETEROSCOPY AND STENT PLACEMENT Right 10/07/2013   Procedure: CYSTOSCOPY WITH RETROGRADE PYELOGRAM, right URETEROSCOPY AND STENT PLACEMENT, stone extraction;  Surgeon: Danae Chen, MD;  Location: [facility];  Service: Urology;  Laterality: Right;   CYSTOSCOPY WITH STENT PLACEMENT Right 06/04/2013   Procedure: CYSTOSCOPY WITH STENT PLACEMENT;  Surgeon: Marcine Matar, MD;  Location: [facility] ORS;  Service: Urology;  Laterality: Right;   DILATATION & CURETTAGE/HYSTEROSCOPY WITH MYOSURE N/A 04/27/2015   Procedure: DILATATION & CURETTAGE/HYSTEROSCOPY WITH MYOSURE;  Surgeon: Ok Edwards, MD;  Location: [facility] ORS;  Service: Gynecology;  Laterality: N/A;   ENDARTERECTOMY FEMORAL Left 06/08/2014   Procedure: Left Leg Common Femoral and External Iliac  Endartarectomy with patch Angioplasty;  Surgeon: Larina Earthly, MD;  Location: [facility] OR;  Service: Vascular;  Laterality: Left;   ESOPHAGOGASTRODUODENOSCOPY N/A 10/14/2018   Procedure: ESOPHAGOGASTRODUODENOSCOPY (EGD);  Surgeon: Lemar Lofty., MD;  Location: [facility] ENDOSCOPY;  Service: Gastroenterology;  Laterality: N/A;  ESOPHAGOGASTRODUODENOSCOPY (EGD) WITH PROPOFOL N/A 11/06/2018   Procedure: ESOPHAGOGASTRODUODENOSCOPY (EGD) WITH PROPOFOL;  Surgeon: Meridee Score Netty Starring., MD;  Location: [facility] ENDOSCOPY;  Service: Gastroenterology;  Laterality: N/A;  RFA   ESOPHAGOGASTRODUODENOSCOPY (EGD) WITH PROPOFOL N/A 02/12/2019   Procedure: ESOPHAGOGASTRODUODENOSCOPY (EGD) WITH PROPOFOL;  Surgeon: Meridee Score Netty Starring., MD;  Location: [facility]  ENDOSCOPY;  Service: Gastroenterology;  Laterality: N/A;   ESOPHAGOGASTRODUODENOSCOPY (EGD) WITH PROPOFOL N/A 08/04/2019   Procedure: ESOPHAGOGASTRODUODENOSCOPY (EGD) WITH PROPOFOL;  Surgeon: Meridee Score Netty Starring., MD;  Location: [facility] ENDOSCOPY;  Service: Gastroenterology;  Laterality: N/A;  WITH RFA   EXTRACORPOREAL SHOCK WAVE LITHOTRIPSY Right 08-04-2013//   06-23-2013//   01-16-2011   EYE SURGERY Bilateral    cataract   FEMORAL-POPLITEAL BYPASS GRAFT Left 06/08/2014   Procedure: Left Leg Femoral -Popliteal Bypass Graft;  Surgeon: Larina Earthly, MD;  Location: [facility] OR;  Service: Vascular;  Laterality: Left;   FEMORAL-POPLITEAL BYPASS GRAFT Left 06/09/2014   Procedure: Left Femoral and Popliteal Exposure; Left Femoral to Anterior Tibial Bypass Graft using Propaten 6mm by 80cm Goretex Graft; Left Tibial Endarterectomy; Left Femoraland Popliteal Thrombectomy ;  Surgeon: Nada Libman, MD;  Location: [facility] OR;  Service: Vascular;  Laterality: Left;   GI RADIOFREQUENCY ABLATION N/A 11/06/2018   Procedure: GI RADIOFREQUENCY ABLATION;  Surgeon: Lemar Lofty., MD;  Location: [facility] ENDOSCOPY;  Service: Gastroenterology;  Laterality: N/A;   GI RADIOFREQUENCY ABLATION N/A 02/12/2019   Procedure: GI RADIOFREQUENCY ABLATION;  Surgeon: Meridee Score Netty Starring., MD;  Location: [facility] ENDOSCOPY;  Service: Gastroenterology;  Laterality: N/A;   GI RADIOFREQUENCY ABLATION N/A 08/04/2019   Procedure: GI RADIOFREQUENCY ABLATION;  Surgeon: Meridee Score Netty Starring., MD;  Location: [facility] ENDOSCOPY;  Service: Gastroenterology;  Laterality: N/A;   HOLMIUM LASER APPLICATION Right 10/07/2013   Procedure: HOLMIUM LASER APPLICATION;  Surgeon: Danae Chen, MD;  Location: [facility];  Service: Urology;  Laterality: Right;   KIDNEY STONE SURGERY  07/2013   1-2 stones   LAPAROSCOPIC GASTRIC BANDING  05/29/2005   LITHOTRIPSY  2-3 times   LOWER EXTREMITY ANGIOGRAM N/A 06/04/2014   Procedure: LOWER EXTREMITY  ANGIOGRAM;  Surgeon: Nada Libman, MD;  Location: [facility] CATH LAB;  Service: Cardiovascular;  Laterality: N/A;   ORIF FIFTH METACARPAL FX  RIGHT HAND  04/21/2002   POLYPECTOMY     REVISION AND RE-SITING LAP-BAND PORT  04/08/2010   W/  UPPER EGD   RIGHT KNEE PATELLECTOMY W/ REPAIR OF EXTENSOR MECHANISM  04/14/2002   TEE WITHOUT CARDIOVERSION N/A 12/24/2019   Procedure: TRANSESOPHAGEAL ECHOCARDIOGRAM (TEE);  Surgeon: Quintella Reichert, MD;  Location: [facility] ENDOSCOPY;  Service: Cardiovascular;  Laterality: N/A;   Toenail removed Left 05/21/2014   2nd toenail-  Dr. Elvin So    Allergies  Allergies  Allergen Reactions   Amlodipine Swelling   Atorvastatin Other (See Comments)    Muscle aches    Ampicillin Nausea And Vomiting and Other (See Comments)   Codeine Nausea And Vomiting    Ask patient   Lisinopril Cough        Penicillins Nausea And Vomiting    Did it involve swelling of the face/tongue/throat, SOB, or low BP? No Did it involve sudden or severe rash/hives, skin peeling, or any reaction on the inside of your mouth or nose? No Did you need to seek medical attention at a hospital or doctor's office? No When did it last happen?      20+ years If all above answers are "NO", may proceed with cephalosporin use.  Januvia [Sitagliptin] Nausea And Vomiting     History of Present Illness    Theresa Harrington is a 67 y.o. female with PMH of atrial fibrillation s/p AF ablation 07/2021, HLD, HTN, hypothyroidism, IDA, PAD s/p femoropopliteal bypass 06/2014 who presents today for 4-month follow-up.  She was recently seen by Dr. Anne Fu in 2018 for management of A-fib.  She also has a past medical history of femoral-popliteal bypass by Dr. Myra Gianotti in 2016 Plavix was discontinued patient was placed on Xarelto due to atrial fibrillation. She was referred to Dr. Elberta Fortis for atrial fibrillation management and underwent AF ablation on 08/03/2021.  She was last seen by Dr. Anne Fu 04/06/2022 and was doing well overall  but endorsed an episode of atrial fibrillation with the flu.  She was started on HCTZ 12.5 mg daily.  When she was seen in follow-up 05/23/2022 she had went into AF with RVR after developing pneumonia.  She was referred to the lipid clinic and was started on Crestor 5 mg daily.  She is scheduled to have follow-up labs on 08/30/2022.  She was seen in the AF clinic by Rudi Coco, NP on 07/04/2022.  She had an episode of AF when starting prep for colonoscopy.  She had accidentally stopped nebivolol after starting Cardizem which may have been a trigger and was back on both medications with sinus rhythm during visit.  Theresa Harrington presents today for 18-month follow-up since last being seen in the office patient reports that she has been doing well from an atrial fibrillation perspective with no outbreaks or breakthrough palpitations.  Her blood pressure has been elevated and was 150/60.  In reviewing her medications she was currently not taking her Cardizem and is currently taking hydralazine twice daily as directed.  She is compliant with her medications and denies any medication adverse reactions.  She was directed to begin taking Cardizem as prescribed and patient will monitor her blood pressures over the next few weeks.  Patient denies chest pain, palpitations, dyspnea, PND, orthopnea, nausea, vomiting, dizziness, syncope, edema, weight gain, or early satiety.  Home Medications    Current Outpatient Medications  Medication Sig Dispense Refill   Blood Glucose Monitoring Suppl (ONETOUCH VERIO IQ SYSTEM) w/Device KIT USE TO CHECK SUGAR DAILY 1 kit 0   cetirizine (ZYRTEC) 10 MG tablet Take 10 mg by mouth daily as needed for allergies.     chlorpheniramine-HYDROcodone (TUSSIONEX) 10-8 MG/5ML Take 5 mLs by mouth every 12 (twelve) hours as needed for cough. 115 mL 0   diltiazem (CARDIZEM CD) 120 MG 24 hr capsule Take 1 capsule (120 mg total) by mouth daily. 90 capsule 0   diltiazem (CARDIZEM) 30 MG tablet Take 1  tablet every 4 hours AS NEEDED for AFIB heart rate >100 as long as top BP >100. 30 tablet 1   dorzolamide-timolol (COSOPT) 22.3-6.8 MG/ML ophthalmic solution Place 1 drop into the right eye 2 (two) times daily.      Empagliflozin-metFORMIN HCl ER (SYNJARDY XR) 25-1000 MG TB24 Take 1 tablet by mouth daily. 90 tablet 1   ezetimibe (ZETIA) 10 MG tablet Take 10 mg by mouth daily.     hydrALAZINE (APRESOLINE) 50 MG tablet Take by mouth.     Insulin Glargine (BASAGLAR KWIKPEN) 100 UNIT/ML Inject 50 Units into the skin at bedtime. Via [employer] patient assistance (Patient taking differently: Inject 40-42 Units into the skin at bedtime. Via [employer] patient assistance taking 46 units) 30 mL 2   insulin lispro (HUMALOG KWIKPEN) 100 UNIT/ML  KwikPen Inject 14 Units into the skin 3 (three) times daily. 45 mL 1   Insulin Pen Needle (PEN NEEDLES) 32G X 4 MM MISC Inject 1 pen  as directed 4 (four) times daily. Use to inject levemir or novolog as directed. 400 each 3   latanoprost (XALATAN) 0.005 % ophthalmic solution Place 1 drop into both eyes at bedtime.      losartan (COZAAR) 25 MG tablet Take 25 mg by mouth daily.     magnesium oxide (MAG-OX) 400 (240 Mg) MG tablet TAKE ONE TABLET BY MOUTH EVERYDAY AT BEDTIME 90 tablet 1   nebivolol (BYSTOLIC) 10 MG tablet Take 1 tablet (10 mg total) by mouth daily. 90 tablet 3   omeprazole (PRILOSEC) 40 MG capsule TAKE ONE CAPSULE BY MOUTH EVERY MORNING and TAKE ONE CAPSULE BY MOUTH EVERY EVENING 180 capsule 1   ONETOUCH VERIO test strip USE TO check blood glucose TWICE DAILY 200 strip 3   rosuvastatin (CRESTOR) 5 MG tablet Take 1 tablet (5 mg total) by mouth daily. 90 tablet 3   Semaglutide, 1 MG/DOSE, (OZEMPIC, 1 MG/DOSE,) 4 MG/3ML SOPN Inject 1 mg into the skin once a week. Via [employer] patient assistance 9 mL 1   Vilazodone HCl (VIIBRYD) 10 MG TABS Take 1 tablet (10 mg total) by mouth daily for 7 days. 7 tablet 0   XARELTO 20 MG TABS tablet Take 1 tablet by mouth  once daily 90 tablet 0   Current Facility-Administered Medications  Medication Dose Route Frequency Provider Last Rate Last Admin   0.9 %  sodium chloride infusion  500 mL Intravenous Once Charlie Pitter III, MD         Review of Systems  Please see the history of present illness.    (+) Trace lower extremity edema  All other systems reviewed and are otherwise negative except as noted above.  Physical Exam    Wt Readings from Last 3 Encounters:  08/09/22 167 lb (75.8 kg)  07/04/22 168 lb 12.8 oz (76.6 kg)  06/20/22 167 lb (75.8 kg)   ZO:XWRUE were no vitals filed for this visit.,There is no height or weight on file to calculate BMI.  Constitutional:      Appearance: Healthy appearance. Not in distress.  Neck:     Vascular: JVD normal.  Pulmonary:     Effort: Pulmonary effort is normal.     Breath sounds: No wheezing. No rales. Diminished in the bases Cardiovascular:     Normal rate. Regular rhythm. Normal S1. Normal S2.      Murmurs: There is no murmur.  Edema:    Peripheral edema absent.  Abdominal:     Palpations: Abdomen is soft non tender. There is no hepatomegaly.  Skin:    General: Skin is warm and dry.  Neurological:     General: No focal deficit present.     Mental Status: Alert and oriented to person, place and time.     Cranial Nerves: Cranial nerves are intact.  EKG/LABS/ Recent Cardiac Studies    ECG personally reviewed by me today -none completed today  Cardiac Studies & Procedures       ECHOCARDIOGRAM  ECHOCARDIOGRAM COMPLETE 10/07/2020  Narrative ECHOCARDIOGRAM REPORT    Patient Name:   Theresa Harrington    Date of Exam: 10/07/2020 Medical Rec #:  [id number]     Height:       64.0 in Accession #:    [id number]    Weight:  171.0 lb Date of Birth:  Dec 09, 1955    BSA:          1.830 m Patient Age:    64 years      BP:           111/62 mmHg Patient Gender: F             HR:           60 bpm. Exam Location:  [address]  Procedure: 2D Echo,  Cardiac Doppler and Color Doppler  Indications:    I34.0 Mitral Valve Insufficiency  History:        Patient has prior history of Echocardiogram examinations, most recent 09/25/2019. PAD, Arrythmias:Atrial Fibrillation; Risk Factors:Hypertension, Dyslipidemia, Diabetes, Family History of Coronary Artery Disease and Former Smoker. Bilateral Breast Augmentation.  Sonographer:    Farrel Conners RDCS Referring Phys: [id number] LAURA R INGOLD  IMPRESSIONS   1. Left ventricular ejection fraction, by estimation, is 65 to 70%. The left ventricle has normal function. The left ventricle has no regional wall motion abnormalities. There is mild left ventricular hypertrophy. Left ventricular diastolic parameters are consistent with Grade II diastolic dysfunction (pseudonormalization). Elevated left atrial pressure. 2. Right ventricular systolic function is normal. The right ventricular size is normal. Tricuspid regurgitation signal is inadequate for assessing PA pressure. 3. Left atrial size was severely dilated. 4. Right atrial size was mildly dilated. 5. The mitral valve is abnormal. Severe mitral annular calcification. No evidence of mitral valve regurgitation. Mild to moderate mitral stenosis. MG , MVA 1.4 cm^2 by continuity equation 6. The aortic valve is tricuspid. Aortic valve regurgitation is not visualized. Mild to moderate aortic valve sclerosis/calcification is present, without any evidence of aortic stenosis. 7. The inferior vena cava is normal in size with greater than 50% respiratory variability, suggesting right atrial pressure of 3 mmHg.  FINDINGS Left Ventricle: Left ventricular ejection fraction, by estimation, is 65 to 70%. The left ventricle has normal function. The left ventricle has no regional wall motion abnormalities. The left ventricular internal cavity size was normal in size. There is mild left ventricular hypertrophy. Left ventricular diastolic parameters are consistent with  Grade II diastolic dysfunction (pseudonormalization). Elevated left atrial pressure.  Right Ventricle: The right ventricular size is normal. No increase in right ventricular wall thickness. Right ventricular systolic function is normal. Tricuspid regurgitation signal is inadequate for assessing PA pressure.  Left Atrium: Left atrial size was severely dilated.  Right Atrium: Right atrial size was mildly dilated.  Pericardium: There is no evidence of pericardial effusion.  Mitral Valve: The mitral valve is abnormal. Severe mitral annular calcification. No evidence of mitral valve regurgitation. Mild to moderate mitral valve stenosis. MV peak gradient, 12.2 mmHg. The mean mitral valve gradient is 4.0 mmHg.  Tricuspid Valve: The tricuspid valve is normal in structure. Tricuspid valve regurgitation is trivial.  Aortic Valve: The aortic valve is tricuspid. Aortic valve regurgitation is not visualized. Mild to moderate aortic valve sclerosis/calcification is present, without any evidence of aortic stenosis.  Pulmonic Valve: The pulmonic valve was grossly normal. Pulmonic valve regurgitation is trivial.  Aorta: The aortic root and ascending aorta are structurally normal, with no evidence of dilitation.  Venous: The inferior vena cava is normal in size with greater than 50% respiratory variability, suggesting right atrial pressure of 3 mmHg.  IAS/Shunts: The interatrial septum was not well visualized.   LEFT VENTRICLE PLAX 2D LVIDd:         4.40 cm  Diastology LVIDs:  2.30 cm  LV e' medial:    6.31 cm/s LV PW:         1.00 cm  LV E/e' medial:  22.8 LV IVS:        0.70 cm  LV e' lateral:   7.51 cm/s LVOT diam:     2.10 cm  LV E/e' lateral: 19.2 LV SV:         89 LV SV Index:   48 LVOT Area:     3.46 cm   RIGHT VENTRICLE RV S prime:     15.10 cm/s TAPSE (M-mode): 2.8 cm  LEFT ATRIUM              Index       RIGHT ATRIUM           Index LA diam:        4.90 cm  2.68 cm/m  RA  Area:     19.40 cm LA Vol (A2C):   103.0 ml 56.27 ml/m RA Volume:   55.90 ml  30.54 ml/m LA Vol (A4C):   109.0 ml 59.55 ml/m LA Biplane Vol: 105.0 ml 57.37 ml/m AORTIC VALVE LVOT Vmax:   95.80 cm/s LVOT Vmean:  60.300 cm/s LVOT VTI:    0.256 m  AORTA Ao Root diam: 3.30 cm Ao Asc diam:  3.50 cm  MITRAL VALVE MV Area (PHT): cm          SHUNTS MV Area VTI:   1.40 cm     Systemic VTI:  0.26 m MV Peak grad:  12.2 mmHg    Systemic Diam: 2.10 cm MV Mean grad:  4.0 mmHg MV Vmax:       1.75 m/s MV Vmean:      87.8 cm/s MV Decel Time: 384 msec MV E velocity: 144.00 cm/s MV A velocity: 107.00 cm/s MV E/A ratio:  1.35  Epifanio Lesches MD Electronically signed by Epifanio Lesches MD Signature Date/Time: 10/07/2020/5:12:33 PM    Final   TEE  ECHO TEE 12/26/2019  Narrative TRANSESOPHOGEAL ECHO REPORT    Patient Name:   Theresa Harrington Date of Exam: 12/24/2019 Medical Rec #:  [id number]  Height:       65.0 in Accession #:    [id number] Weight:       167.0 lb Date of Birth:  Nov 25, 1955 BSA:          1.832 m Patient Age:    63 years   BP:           165/65 mmHg Patient Gender: F          HR:           57 bpm. Exam Location:  Inpatient  Procedure: 3D Echo, Transesophageal Echo, Cardiac Doppler and Color Doppler  Indications:     I34.2 Nonrheumatic mitral (valve) stenosis  History:         Patient has prior history of Echocardiogram examinations, most recent 09/24/2019. Abnormal ECG, Mitral Valve Disease, Arrythmias:Atrial Fibrillation; Risk Factors:Diabetes and Dyslipidemia. Mitral stenosis.  Sonographer:     Sheralyn Boatman RDCS Referring Phys:  [id number] TRACI R TURNER Diagnosing Phys: Armanda Magic MD  PROCEDURE: After discussion of the risks and benefits of a TEE, an informed consent was obtained from the patient. The transesophogeal probe was passed without difficulty through the esophogus of the patient. Imaged were obtained with the patient in a left lateral decubitus  position. Local oropharyngeal anesthetic was provided with Cetacaine. Sedation performed by different physician. The patient was monitored  while under deep sedation. Anesthestetic sedation was provided intravenously by Anesthesiology: 115mg  of Propofol, 60mg  of Lidocaine. The patient's vital signs; including heart rate, blood pressure, and oxygen saturation; remained stable throughout the procedure. The patient developed no complications during the procedure.  IMPRESSIONS   1. Left ventricular ejection fraction, by estimation, is 60 to 65%. The left ventricle has normal function. The left ventricle has no regional wall motion abnormalities. 2. Right ventricular systolic function is normal. The right ventricular size is mildly enlarged. 3. Left atrial size was moderately dilated. No left atrial/left atrial appendage thrombus was detected. 4. 3D imaging of mitral valve was performed. The mitral valve is degenerative in appearance. There is severe thickening of the posterior mitral valve leaflet(s). There is severe calcification of the posterior mitral valve leaflet(s). Fixed posterior leaflet of the mitral valve leaflets. Severe mitral annular calcification. No evidence of mitral valve regurgitation. Very Mild mitral stenosis. The mean mitral valve gradient is 2.5 mmHg. 5. The aortic valve is normal in structure. Aortic valve regurgitation is trivial. Mild aortic valve sclerosis is present, with no evidence of aortic valve stenosis. 6. The inferior vena cava is normal in size with greater than 50% respiratory variability, suggesting right atrial pressure of 3 mmHg.  Conclusion(s)/Recommendation(s): Normal biventricular function without evidence of hemodynamically significant valvular heart disease.  FINDINGS Left Ventricle: Left ventricular ejection fraction, by estimation, is 60 to 65%. The left ventricle has normal function. The left ventricle has no regional wall motion abnormalities. The left  ventricular internal cavity size was normal in size. There is no left ventricular hypertrophy.  Right Ventricle: The right ventricular size is mildly enlarged. No increase in right ventricular wall thickness. Right ventricular systolic function is normal.  Left Atrium: Left atrial size was moderately dilated. Spontaneous echo contrast was present. No left atrial/left atrial appendage thrombus was detected.  Right Atrium: Right atrial size was normal in size.  Pericardium: There is no evidence of pericardial effusion.  Mitral Valve: 3D imaging of mitral valve was performed. The mitral valve is degenerative in appearance. There is severe thickening of the posterior mitral valve leaflet(s). There is severe calcification of the posterior mitral valve leaflet(s). Fixed posterior leaflet of the mitral valve leaflets. Severe mitral annular calcification. No evidence of mitral valve regurgitation. Mild mitral valve stenosis. MV peak gradient, 7.8 mmHg. The mean mitral valve gradient is 2.5 mmHg.  Tricuspid Valve: The tricuspid valve is normal in structure. Tricuspid valve regurgitation is trivial. No evidence of tricuspid stenosis.  Aortic Valve: The aortic valve is normal in structure.. There is mild thickening of the aortic valve. Aortic valve regurgitation is trivial. Mild aortic valve sclerosis is present, with no evidence of aortic valve stenosis. There is mild thickening of the aortic valve.  Pulmonic Valve: The pulmonic valve was normal in structure. Pulmonic valve regurgitation is trivial. No evidence of pulmonic stenosis.  Aorta: The aortic root is normal in size and structure.  Venous: The inferior vena cava is normal in size with greater than 50% respiratory variability, suggesting right atrial pressure of 3 mmHg.  IAS/Shunts: The interatrial septum appears to be lipomatous. No atrial level shunt detected by color flow Doppler.   MITRAL VALVE MV Area (PHT): 1.91 cm MV Peak grad:   7.8 mmHg MV Mean grad:  2.5 mmHg MV Vmax:       1.40 m/s MV Vmean:      71.0 cm/s MV Decel Time: 397 msec MV E velocity: 136.00 cm/s MV A velocity: 87.10  cm/s MV E/A ratio:  1.56  Armanda Magic MD Electronically signed by Armanda Magic MD Signature Date/Time: 12/26/2019/1:12:11 PM    Final            Risk Assessment/Calculations:    CHA2DS2-VASc Score = 5   This indicates a 7.2% annual risk of stroke. The patient's score is based upon: CHF History: 0 HTN History: 1 Diabetes History: 1 Stroke History: 0 Vascular Disease History: 1 Age Score: 1 Gender Score: 1           Lab Results  Component Value Date   WBC 7.4 06/07/2022   HGB 12.3 06/07/2022   HCT 36.9 06/07/2022   MCV 87.2 06/07/2022   PLT 298.0 06/07/2022   Lab Results  Component Value Date   CREATININE 0.70 04/04/2022   BUN 11 04/04/2022   NA 139 04/04/2022   K 4.3 04/04/2022   CL 104 04/04/2022   CO2 28 04/04/2022   Lab Results  Component Value Date   ALT 11 04/04/2022   AST 13 04/04/2022   ALKPHOS 75 04/04/2022   BILITOT 0.4 04/04/2022   Lab Results  Component Value Date   CHOL 223 (H) 04/04/2022   HDL 53.40 04/04/2022   LDLCALC 131 (H) 04/04/2022   LDLDIRECT 160.2 03/01/2010   TRIG 192.0 (H) 04/04/2022   CHOLHDL 4 04/04/2022    Lab Results  Component Value Date   HGBA1C 8.5 (H) 04/04/2022     Assessment & Plan    1.  Persistent atrial fibrillation: -s/p AF ablation 07/2021 by Dr. Elberta Fortis -Today patient is rate controlled at 76 bpm and reports no breakthrough episodes of AF or palpitations. Visit we discussed the pathophysiology of atrial fibrillation and the importance to comply with medications. -Continue Bystolic 10 mg daily -Patient will resume Cardizem 120 mg daily Patient's current creatinine clearance is 93 mL/min continue Xarelto 20 mg daily -CHA2DS2-VASc Score = 5  The patient's score is based upon: CHF History: 0 HTN History: 1 Diabetes History: 1 Stroke History:  0 Vascular Disease History: 1 Age Score: 1 Gender Score: 1   2.  Essential hypertension: -Patient's blood pressure today was elevated at 150/60. -Patient reports that she has not taking Cardizem 120 mg daily -She will start Cardizem 120 mg today and -Continue losartan 25 mg, and Bystolic 10 mg   3.  Peripheral artery disease: -s/p femoropopliteal bypass 2016 and currently followed by [facility] -Patient reports some swelling in her left lower extremity and was advised to use compression stockings and elevate as needed   4.  Hyperlipidemia: -Patient's last LDL cholesterol was 131 -Patient was recently seen in the lipid clinic and was started on Crestor 5 mg  -She is scheduled to have lipids and LFTs rechecked 08/30/2022    5.  DM type II: -Continue treatment plan per PCP and endocrinology   Disposition: Follow-up with Donato Schultz, MD or APP in 6 months    Medication Adjustments/Labs and Tests Ordered: Current medicines are reviewed at length with the patient today.  Concerns regarding medicines are outlined above.   Signed, Napoleon Form, Leodis Rains, NP 08/22/2022, 8:43 PM [facility]

## 2022-08-23 ENCOUNTER — Encounter: Payer: Self-pay | Admitting: Nurse Practitioner

## 2022-08-23 ENCOUNTER — Telehealth: Payer: Self-pay | Admitting: Internal Medicine

## 2022-08-23 ENCOUNTER — Ambulatory Visit: Payer: PPO | Attending: Nurse Practitioner | Admitting: Nurse Practitioner

## 2022-08-23 VITALS — BP 150/60 | HR 76 | Ht 64.0 in | Wt 165.6 lb

## 2022-08-23 DIAGNOSIS — I10 Essential (primary) hypertension: Secondary | ICD-10-CM | POA: Diagnosis not present

## 2022-08-23 DIAGNOSIS — I739 Peripheral vascular disease, unspecified: Secondary | ICD-10-CM

## 2022-08-23 DIAGNOSIS — E785 Hyperlipidemia, unspecified: Secondary | ICD-10-CM | POA: Diagnosis not present

## 2022-08-23 DIAGNOSIS — I4819 Other persistent atrial fibrillation: Secondary | ICD-10-CM

## 2022-08-23 DIAGNOSIS — I4891 Unspecified atrial fibrillation: Secondary | ICD-10-CM | POA: Diagnosis not present

## 2022-08-23 DIAGNOSIS — E119 Type 2 diabetes mellitus without complications: Secondary | ICD-10-CM | POA: Diagnosis not present

## 2022-08-23 MED ORDER — DILTIAZEM HCL ER COATED BEADS 120 MG PO CP24
120.0000 mg | ORAL_CAPSULE | Freq: Every day | ORAL | 1 refills | Status: DC
Start: 2022-08-23 — End: 2022-09-30

## 2022-08-23 NOTE — Telephone Encounter (Signed)
Contacted Arly Nazar to schedule their annual wellness visit. Appointment made for 08/29/2022.  [facility] Care Guide [facility] AWV TEAM Direct Dial: [phone]

## 2022-08-23 NOTE — Patient Instructions (Addendum)
Medication Instructions:  RESTART Cardizem  Take 1 tablet once day  *If you need a refill on your cardiac medications before your next appointment, please call your pharmacy*   Lab Work: None ordered   Testing/Procedures: None ordered   Follow-Up: At [facility], you and your health needs are our priority.  As part of our continuing mission to provide you with exceptional heart care, we have created designated Provider Care Teams.  These Care Teams include your primary Cardiologist (physician) and Advanced Practice Providers (APPs -  Physician Assistants and Nurse Practitioners) who all work together to provide you with the care you need, when you need it.  We recommend signing up for the patient portal called "MyChart".  Sign up information is provided on this After Visit Summary.  MyChart is used to connect with patients for Virtual Visits (Telemedicine).  Patients are able to view lab/test results, encounter notes, upcoming appointments, etc.  Non-urgent messages can be sent to your provider as well.   To learn more about what you can do with MyChart, go to [email or web address].    Your next appointment:   6 month(s)  Provider:   Donato Schultz, MD  or Robin Searing, NP   Other Instructions Check your blood pressure daily for 1-2 weeks, then contact the office with your readings.

## 2022-08-29 ENCOUNTER — Telehealth: Payer: Self-pay

## 2022-08-29 DIAGNOSIS — Z89422 Acquired absence of other left toe(s): Secondary | ICD-10-CM | POA: Diagnosis not present

## 2022-08-29 DIAGNOSIS — Z8631 Personal history of diabetic foot ulcer: Secondary | ICD-10-CM | POA: Diagnosis not present

## 2022-08-29 DIAGNOSIS — L89891 Pressure ulcer of other site, stage 1: Secondary | ICD-10-CM | POA: Diagnosis not present

## 2022-08-29 DIAGNOSIS — E1151 Type 2 diabetes mellitus with diabetic peripheral angiopathy without gangrene: Secondary | ICD-10-CM | POA: Diagnosis not present

## 2022-08-29 NOTE — Telephone Encounter (Addendum)
Called patient 3 times lvm to return call, to complete AWV at [phone].  If no return call within 15 to 20 minutes, patient may reschedule for the next available appointment with [facility] or CMA.

## 2022-08-30 ENCOUNTER — Ambulatory Visit: Payer: PPO | Attending: Internal Medicine

## 2022-08-30 ENCOUNTER — Ambulatory Visit: Payer: PPO | Admitting: Internal Medicine

## 2022-09-02 ENCOUNTER — Other Ambulatory Visit: Payer: Self-pay | Admitting: Internal Medicine

## 2022-09-02 DIAGNOSIS — E1159 Type 2 diabetes mellitus with other circulatory complications: Secondary | ICD-10-CM

## 2022-09-11 ENCOUNTER — Other Ambulatory Visit: Payer: Self-pay | Admitting: Internal Medicine

## 2022-09-11 DIAGNOSIS — E1159 Type 2 diabetes mellitus with other circulatory complications: Secondary | ICD-10-CM

## 2022-09-13 ENCOUNTER — Ambulatory Visit (INDEPENDENT_AMBULATORY_CARE_PROVIDER_SITE_OTHER): Payer: PPO | Admitting: Internal Medicine

## 2022-09-13 ENCOUNTER — Encounter: Payer: Self-pay | Admitting: Internal Medicine

## 2022-09-13 VITALS — BP 134/82 | HR 81 | Temp 98.0°F | Ht 64.0 in | Wt 165.0 lb

## 2022-09-13 DIAGNOSIS — I10 Essential (primary) hypertension: Secondary | ICD-10-CM | POA: Diagnosis not present

## 2022-09-13 DIAGNOSIS — E119 Type 2 diabetes mellitus without complications: Secondary | ICD-10-CM | POA: Insufficient documentation

## 2022-09-13 DIAGNOSIS — Z124 Encounter for screening for malignant neoplasm of cervix: Secondary | ICD-10-CM

## 2022-09-13 DIAGNOSIS — E042 Nontoxic multinodular goiter: Secondary | ICD-10-CM | POA: Diagnosis not present

## 2022-09-13 DIAGNOSIS — Z794 Long term (current) use of insulin: Secondary | ICD-10-CM

## 2022-09-13 DIAGNOSIS — E2839 Other primary ovarian failure: Secondary | ICD-10-CM | POA: Insufficient documentation

## 2022-09-13 DIAGNOSIS — N182 Chronic kidney disease, stage 2 (mild): Secondary | ICD-10-CM

## 2022-09-13 DIAGNOSIS — I4819 Other persistent atrial fibrillation: Secondary | ICD-10-CM

## 2022-09-13 DIAGNOSIS — E1159 Type 2 diabetes mellitus with other circulatory complications: Secondary | ICD-10-CM

## 2022-09-13 DIAGNOSIS — E785 Hyperlipidemia, unspecified: Secondary | ICD-10-CM

## 2022-09-13 LAB — LIPID PANEL
Cholesterol: 111 mg/dL (ref 0–200)
HDL: 48.1 mg/dL (ref >39.00)
LDL Cholesterol: 39 mg/dL (ref 0–99)
NonHDL: 63
Total CHOL/HDL Ratio: 2
Triglycerides: 118 mg/dL (ref 0.0–149.0)
VLDL: 23.6 mg/dL (ref 0.0–40.0)

## 2022-09-13 LAB — BASIC METABOLIC PANEL
BUN: 11 mg/dL (ref 6–23)
CO2: 28 mEq/L (ref 19–32)
Calcium: 9.2 mg/dL (ref 8.4–10.5)
Chloride: 104 mEq/L (ref 96–112)
Creatinine, Ser: 0.75 mg/dL (ref 0.40–1.20)
GFR: 83.47 mL/min (ref >60.00)
Glucose, Bld: 211 mg/dL — ABNORMAL HIGH (ref 70–99)
Potassium: 3.6 mEq/L (ref 3.5–5.1)
Sodium: 140 mEq/L (ref 135–145)

## 2022-09-13 LAB — HEMOGLOBIN A1C: Hgb A1c MFr Bld: 7.7 % — ABNORMAL HIGH (ref 4.6–6.5)

## 2022-09-13 LAB — TSH: TSH: 0.58 u[IU]/mL (ref 0.35–5.50)

## 2022-09-13 MED ORDER — EMPAGLIFLOZIN 10 MG PO TABS
10.0000 mg | ORAL_TABLET | Freq: Every day | ORAL | 1 refills | Status: DC
Start: 2022-09-13 — End: 2022-09-27

## 2022-09-13 MED ORDER — METFORMIN HCL ER 750 MG PO TB24
750.0000 mg | ORAL_TABLET | Freq: Every day | ORAL | 1 refills | Status: DC
Start: 2022-09-13 — End: 2022-09-27

## 2022-09-13 NOTE — Patient Instructions (Signed)
Atrial Fibrillation Atrial fibrillation (AFib) is a type of irregular or rapid heartbeat (arrhythmia). In AFib, the top part of the heart (atria) beats in an irregular pattern. This makes the heart unable to pump blood normally and effectively. The goal of treatment is to prevent blood clots from forming, control your heart rate, or restore your heartbeat to a normal rhythm. If this condition is not treated, it can cause serious problems, such as a weakened heart muscle (cardiomyopathy) or a stroke. What are the causes? This condition is often caused by medical conditions that damage the heart's electrical system. These include: High blood pressure (hypertension). This is the most common cause. Certain heart problems or conditions, such as heart failure, coronary artery disease, heart valve problems, or heart surgery. Diabetes. Overactive thyroid (hyperthyroidism). Chronic kidney disease. Certain lung conditions, such as emphysema, pneumonia, or COPD. Obstructive sleep apnea. In some cases, the cause of this condition is not known. What increases the risk? This condition is more likely to develop in: Older adults. Athletes who do endurance exercise. People who have a family history of AFib. Males. People who are Caucasian. People who are obese. People who smoke or misuse alcohol. What are the signs or symptoms? Symptoms of this condition include: Fast or irregular heartbeats (palpitations). Discomfort or pain in your chest. Shortness of breath. Sudden light-headedness or weakness. Tiring easily during exercise or activity. Syncope (fainting). Sweating. In some cases, there are no symptoms. How is this diagnosed? Your health care provider may detect AFib when taking your pulse. If detected, this condition may be diagnosed with: An electrocardiogram (ECG) to check electrical signals of the heart. An ambulatory cardiac monitor to record your heart's activity for a few days. A  transthoracic echocardiogram (TTE) to create pictures of your heart. A transesophageal echocardiogram (TEE) to create even clearer pictures of your heart. A stress test to check your blood supply while you exercise. Imaging tests, such as a CT scan or chest X-ray. Blood tests. How is this treated? Treatment depends on underlying conditions and how you feel when you get AFib. This condition may be treated with: Medicines to prevent blood clots or to treat heart rate or heart rhythm problems. Electrical cardioversion to reset the heart's rhythm. A pacemaker to correct abnormal heart rhythm. Ablation to remove the heart tissue that sends abnormal signals. Left atrial appendage closure to seal the area where blood clots can form. In some cases, underlying conditions will be treated. Follow these instructions at home: Medicines Take over-the counter and prescription medicines only as told by your provider. Do not take any new medicines without talking to your provider. If you are taking blood thinners: Talk with your provider before taking aspirin or NSAIDs. These medicines can raise your risk of bleeding. Take your medicines as told. Take them at the same time each day. Do not do things that could hurt or bruise you. Be careful to avoid falls. Wear an alert bracelet or carry a card that says that you take blood thinners. Lifestyle Do not use any products that contain nicotine or tobacco. These products include cigarettes, chewing tobacco, and vaping devices, such as e-cigarettes. If you need help quitting, ask your provider. Eat heart-healthy foods. Talk with a food expert (dietitian) to make an eating plan that is right for you. Exercise regularly as told by your provider. Do not drink alcohol. Lose weight if you are overweight. General instructions If you have obstructive sleep apnea, manage your condition as told by your provider.   Do not use diet pills unless your provider approves. Diet  pills can make heart problems worse. Keep all follow-up visits. Your provider will want to check your heart rate and rhythm regularly. Contact a health care provider if: You notice a change in the rate, rhythm, or strength of your heartbeat. You are taking a blood thinner and you notice more bruising. You tire more easily when you exercise or do heavy work. You have a sudden change in weight. Get help right away if:  You have chest pain. You have trouble breathing. You have side effects of blood thinners, such as blood in your vomit, poop (stool), or pee (urine), or bleeding that does not stop. You have any symptoms of a stroke. "BE FAST" is an easy way to remember the main warning signs of a stroke: B - Balance. Signs are dizziness, sudden trouble walking, or loss of balance. E - Eyes. Signs are trouble seeing or a sudden change in vision. F - Face. Signs are sudden weakness or numbness of the face, or the face or eyelid drooping on one side. A - Arms. Signs are weakness or numbness in an arm. This happens suddenly and usually on one side of the body. S - Speech.Signs are sudden trouble speaking, slurred speech, or trouble understanding what people say. T - Time. Time to call emergency services. Write down what time symptoms started. Other signs of a stroke, such as: A sudden, severe headache with no known cause. Nausea or vomiting. Seizure. These symptoms may be an emergency. Get help right away. Call 911. Do not wait to see if the symptoms will go away. Do not drive yourself to the hospital. This information is not intended to replace advice given to you by your health care provider. Make sure you discuss any questions you have with your health care provider. Document Revised: 01/04/2022 Document Reviewed: 01/04/2022 Elsevier Patient Education  2023 Elsevier Inc.  

## 2022-09-13 NOTE — Progress Notes (Signed)
Subjective:  Patient ID: Theresa Harrington, female    DOB: 1956-03-19  Age: 67 y.o. MRN: [id number]  CC: Hyperlipidemia, Hypertension, Diabetes, and Atrial Fibrillation   HPI [name] presents for f/up ---  She complains that she is back in atrial fibrillation over the last 24 hours and has had a few episodes of weakness.  She denies dizziness, lightheadedness, near-syncope, syncope, edema, or fatigue.  Outpatient Medications Prior to Visit  Medication Sig Dispense Refill   Blood Glucose Monitoring Suppl (ONETOUCH VERIO IQ SYSTEM) w/Device KIT USE TO CHECK SUGAR DAILY 1 kit 0   cetirizine (ZYRTEC) 10 MG tablet Take 10 mg by mouth daily as needed for allergies.     diltiazem (CARDIZEM CD) 120 MG 24 hr capsule Take 1 capsule (120 mg total) by mouth daily. 90 capsule 1   diltiazem (CARDIZEM) 30 MG tablet Take 1 tablet every 4 hours AS NEEDED for AFIB heart rate >100 as long as top BP >100. 30 tablet 1   dorzolamide-timolol (COSOPT) 22.3-6.8 MG/ML ophthalmic solution Place 1 drop into the right eye 2 (two) times daily.      ezetimibe (ZETIA) 10 MG tablet Take 10 mg by mouth daily.     hydrALAZINE (APRESOLINE) 50 MG tablet Take 50 mg by mouth 2 (two) times daily.     Insulin Glargine (BASAGLAR KWIKPEN) 100 UNIT/ML Inject 50 Units into the skin at bedtime. Via [employer] patient assistance (Patient taking differently: Inject 40-42 Units into the skin at bedtime. Via [employer] patient assistance taking 46 units) 30 mL 2   insulin lispro (HUMALOG KWIKPEN) 100 UNIT/ML KwikPen Inject 14 Units into the skin 3 (three) times daily. 45 mL 1   Insulin Pen Needle (PEN NEEDLES) 32G X 4 MM MISC Inject 1 pen  as directed 4 (four) times daily. Use to inject levemir or novolog as directed. 400 each 3   latanoprost (XALATAN) 0.005 % ophthalmic solution Place 1 drop into both eyes at bedtime.      losartan (COZAAR) 25 MG tablet Take 25 mg by mouth daily.     magnesium oxide (MAG-OX) 400 (240 Mg) MG tablet TAKE  ONE TABLET BY MOUTH EVERYDAY AT BEDTIME 90 tablet 1   nebivolol (BYSTOLIC) 10 MG tablet Take 1 tablet (10 mg total) by mouth daily. 90 tablet 3   omeprazole (PRILOSEC) 40 MG capsule TAKE ONE CAPSULE BY MOUTH EVERY MORNING and TAKE ONE CAPSULE BY MOUTH EVERY EVENING 180 capsule 1   ONETOUCH VERIO test strip USE TO check blood glucose TWICE DAILY 200 strip 3   rosuvastatin (CRESTOR) 5 MG tablet Take 1 tablet (5 mg total) by mouth daily. 90 tablet 3   Semaglutide, 1 MG/DOSE, (OZEMPIC, 1 MG/DOSE,) 4 MG/3ML SOPN Inject 1 mg into the skin once a week. Via [employer] patient assistance 9 mL 1   XARELTO 20 MG TABS tablet Take 1 tablet by mouth once daily 90 tablet 0   Empagliflozin-metFORMIN HCl ER (SYNJARDY XR) 25-1000 MG TB24 Take 1 tablet by mouth daily. 90 tablet 1   Vilazodone HCl (VIIBRYD) 10 MG TABS Take 1 tablet (10 mg total) by mouth daily for 7 days. 7 tablet 0   0.9 %  sodium chloride infusion      No facility-administered medications prior to visit.    ROS Review of Systems  Constitutional: Negative.  Negative for diaphoresis and fatigue.  HENT: Negative.    Eyes: Negative.   Respiratory:  Negative for cough, chest tightness, shortness of breath  and wheezing.   Cardiovascular:  Positive for palpitations. Negative for chest pain and leg swelling.  Gastrointestinal:  Negative for abdominal pain, diarrhea, nausea and vomiting.  Endocrine: Negative.   Genitourinary: Negative.  Negative for difficulty urinating.  Musculoskeletal: Negative.  Negative for arthralgias and myalgias.  Skin: Negative.   Neurological:  Positive for weakness. Negative for dizziness and light-headedness.  Hematological:  Negative for adenopathy. Does not bruise/bleed easily.  Psychiatric/Behavioral: Negative.      Objective:  BP 134/82 (BP Location: Left Arm, Patient Position: Sitting, Cuff Size: Large)   Pulse 81   Temp 98 F (36.7 C) (Oral)   Ht 5\' 4"  (1.626 m)   Wt 165 lb (74.8 kg)   LMP  (LMP Unknown)    SpO2 95%   BMI 28.32 kg/m   BP Readings from Last 3 Encounters:  09/13/22 134/82  08/23/22 (!) 150/60  08/09/22 (!) 149/57    Wt Readings from Last 3 Encounters:  09/13/22 165 lb (74.8 kg)  08/23/22 165 lb 9.6 oz (75.1 kg)  08/09/22 167 lb (75.8 kg)    Physical Exam Vitals reviewed.  Constitutional:      Appearance: Normal appearance.  HENT:     Nose: Nose normal.     Mouth/Throat:     Mouth: Mucous membranes are moist.  Eyes:     General: No scleral icterus.    Conjunctiva/sclera: Conjunctivae normal.  Cardiovascular:     Rate and Rhythm: Normal rate. Rhythm irregularly irregular.     Heart sounds: Normal heart sounds, S1 normal and S2 normal. No murmur heard.    No gallop.     Comments: EKG- A fib, 94 bpm [place] Q in III No LVH or acute ST/T wave changes Pulmonary:     Effort: Pulmonary effort is normal.     Breath sounds: No stridor. No wheezing, rhonchi or rales.  Abdominal:     General: Abdomen is flat.     Palpations: There is no mass.     Tenderness: There is no abdominal tenderness. There is no guarding.     Hernia: No hernia is present.  Musculoskeletal:     Cervical back: Neck supple.     Right lower leg: No edema.     Left lower leg: No edema.  Skin:    General: Skin is warm and dry.  Neurological:     General: No focal deficit present.     Mental Status: She is alert. Mental status is at baseline.  Psychiatric:        Mood and Affect: Mood normal.        Behavior: Behavior normal.     Lab Results  Component Value Date   WBC 7.4 06/07/2022   HGB 12.3 06/07/2022   HCT 36.9 06/07/2022   PLT 298.0 06/07/2022   GLUCOSE 211 (H) 09/13/2022   CHOL 111 09/13/2022   TRIG 118.0 09/13/2022   HDL 48.10 09/13/2022   LDLDIRECT 160.2 03/01/2010   LDLCALC 39 09/13/2022   ALT 11 04/04/2022   AST 13 04/04/2022   NA 140 09/13/2022   K 3.6 09/13/2022   CL 104 09/13/2022   CREATININE 0.75 09/13/2022   BUN 11 09/13/2022   CO2 28 09/13/2022   TSH  0.58 09/13/2022   INR 1.3 (H) 03/08/2020   HGBA1C 7.7 (H) 09/13/2022   MICROALBUR 1.5 04/04/2022    No results found.  Assessment & Plan:   Estrogen deficiency -     DG Bone Density; Future -  EKG 12-Lead  Persistent atrial fibrillation (HCC)- She has good rate control.  Will continue the calcium channel blocker and the DOAC. -     TSH; Future -     EKG 12-Lead  Diabetes mellitus with coincident hypertension (HCC)- Her blood sugar is adequately well-controlled. -     Empagliflozin; Take 1 tablet (10 mg total) by mouth daily before breakfast.  Dispense: 90 tablet; Refill: 1  Essential hypertension, benign -     TSH; Future -     Basic metabolic panel; Future  GOITER, MULTINODULAR- She is euthyroid. -     TSH; Future  Insulin-requiring or dependent type II diabetes mellitus (HCC) -     Hemoglobin A1c; Future -     Basic metabolic panel; Future -     metFORMIN HCl ER; Take 1 tablet (750 mg total) by mouth daily with breakfast.  Dispense: 90 tablet; Refill: 1 -     Empagliflozin; Take 1 tablet (10 mg total) by mouth daily before breakfast.  Dispense: 90 tablet; Refill: 1  Screening for cervical cancer  Hyperlipidemia with target LDL less than 100 - LDL goal achieved. Doing well on the statin  -     Lipid panel; Future  Type 2 diabetes mellitus with other circulatory complication, with long-term current use of insulin (HCC)  Chronic renal disease, stage 2, mildly decreased glomerular filtration rate (GFR) between 60-89 mL/min/1.73 square meter -     Empagliflozin; Take 1 tablet (10 mg total) by mouth daily before breakfast.  Dispense: 90 tablet; Refill: 1     Follow-up: Return in about 4 months (around 01/14/2023).  Sanda Linger, MD

## 2022-09-16 ENCOUNTER — Other Ambulatory Visit: Payer: Self-pay | Admitting: Internal Medicine

## 2022-09-16 DIAGNOSIS — E1159 Type 2 diabetes mellitus with other circulatory complications: Secondary | ICD-10-CM

## 2022-09-19 ENCOUNTER — Ambulatory Visit (HOSPITAL_COMMUNITY)
Admission: RE | Admit: 2022-09-19 | Discharge: 2022-09-19 | Disposition: A | Discharge status: Home / Self Care | Payer: PPO | Source: Ambulatory Visit | Attending: Physician Assistant | Admitting: Physician Assistant

## 2022-09-19 VITALS — BP 120/80 | HR 141 | Ht 64.0 in | Wt 169.2 lb

## 2022-09-19 DIAGNOSIS — D6869 Other thrombophilia: Secondary | ICD-10-CM | POA: Insufficient documentation

## 2022-09-19 DIAGNOSIS — I4892 Unspecified atrial flutter: Secondary | ICD-10-CM | POA: Diagnosis not present

## 2022-09-19 DIAGNOSIS — I2510 Atherosclerotic heart disease of native coronary artery without angina pectoris: Secondary | ICD-10-CM | POA: Insufficient documentation

## 2022-09-19 DIAGNOSIS — I484 Atypical atrial flutter: Secondary | ICD-10-CM

## 2022-09-19 DIAGNOSIS — I4819 Other persistent atrial fibrillation: Secondary | ICD-10-CM | POA: Diagnosis not present

## 2022-09-19 DIAGNOSIS — I10 Essential (primary) hypertension: Secondary | ICD-10-CM | POA: Diagnosis not present

## 2022-09-19 DIAGNOSIS — I739 Peripheral vascular disease, unspecified: Secondary | ICD-10-CM | POA: Insufficient documentation

## 2022-09-19 DIAGNOSIS — Z87891 Personal history of nicotine dependence: Secondary | ICD-10-CM | POA: Insufficient documentation

## 2022-09-19 LAB — BASIC METABOLIC PANEL
Anion gap: 8 (ref 5–15)
BUN: 11 mg/dL (ref 8–23)
CO2: 23 mmol/L (ref 22–32)
Calcium: 9 mg/dL (ref 8.9–10.3)
Chloride: 108 mmol/L (ref 98–111)
Creatinine, Ser: 0.91 mg/dL (ref 0.44–1.00)
GFR, Estimated: >60 mL/min (ref >60)
Glucose, Bld: 186 mg/dL — ABNORMAL HIGH (ref 70–99)
Potassium: 3.9 mmol/L (ref 3.5–5.1)
Sodium: 139 mmol/L (ref 135–145)

## 2022-09-19 LAB — CBC
HCT: 36.4 % (ref 36.0–46.0)
Hemoglobin: 11 g/dL — ABNORMAL LOW (ref 12.0–15.0)
MCH: 26.1 pg (ref 26.0–34.0)
MCHC: 30.2 g/dL (ref 30.0–36.0)
MCV: 86.5 fL (ref 80.0–100.0)
Platelets: 307 10*3/uL (ref 150–400)
RBC: 4.21 MIL/uL (ref 3.87–5.11)
RDW: 14.1 % (ref 11.5–15.5)
WBC: 9.7 10*3/uL (ref 4.0–10.5)
nRBC: 0 % (ref 0.0–0.2)

## 2022-09-19 NOTE — Patient Instructions (Signed)
Cardioversion scheduled for:  Tuesday, June 4th   - Arrive at the [facility] and go to admitting at 830am   - Do not eat or drink anything after midnight the night prior to your procedure.   - Take all your morning medication (except diabetic medications) with a sip of water prior to arrival.  - You will not be able to drive home after your procedure.    - Do NOT miss any doses of your blood thinner - if you should miss a dose please notify our office immediately.   - If you feel as if you go back into normal rhythm prior to scheduled cardioversion, please notify our office immediately.   If your procedure is canceled in the cardioversion suite you will be charged a cancellation fee.   HOLD OZEMPIC ON MAY 31st  Hold medication 7 days prior to scheduled procedure/anesthesia.  Restart medication on the normal dosing day after scheduled procedure/anesthesia  Dulaglutide (Trulicity) Exenatide extended release (Bydureon bcise) Semaglutide (Ozempic) (WEGOVY)  Tirzepatide (Mounjaro)     Hold medication 24 hours prior to scheduled procedure/anesthesia.   Restart medication on the following day after scheduled procedure/anesthesia   Exenatide (Byetta)  Liraglutide (Victoza, Saxenda)  Lixisenatide (Adlyxin)  Semaglutide (Rybelsus) Polyethylene Glycol Loxenatide   For those patients who have a scheduled procedure/anesthesia on the same day of the week as their dose, hold the medication on the day of surgery.  They can take their scheduled dose the week before.  **Patients on the above medications scheduled for elective procedures that have not held the medication for the appropriate amount of time are at risk of cancellation or change in the anesthetic plan.

## 2022-09-19 NOTE — Progress Notes (Signed)
Primary Care Physician: Etta Grandchild, MD Referring Physician:Dr. Elberta Fortis  Primary Cardiologist: Dr Lucianne Muss is a 67 y.o. female with a h/o afib fem-pop bypass 06/08/14, DM, CAD, HTN, atrial fibrillation who presents for follow up in the [facility]. She had a DCCV in 2021 but had quick return of afib. Patient is s/p afib ablation with Dr Elberta Fortis on 08/02/21.   On follow up today, patient reports that she woke 09/13/22 with rapid heart rates. She has used her PRN diltiazem to try and slow her heart rate. She does admit to missing all of her medications on 5/13. There were no other specific triggers that she could identify. She is minimally symptomatic.   Today, she denies symptoms of palpitations, chest pain, shortness of breath, orthopnea, PND, lower extremity edema, dizziness, presyncope, syncope, or neurologic sequela. The patient is tolerating medications without difficulties and is otherwise without complaint today.   Past Medical History:  Diagnosis Date   Allergy    Anxiety    Pt. denies   Arthritis    right index figer   Asymptomatic cholelithiasis    Atherosclerosis of aorta (HCC)    Cataract    Clotting disorder (HCC)    Gallstones 06/2013   GERD (gastroesophageal reflux disease)    Glaucoma    "A LITTLE BIT"   History of kidney stones    lithotrispy   Hyperlipidemia    Hypertension    Hypothyroidism    no meds   Iron deficiency anemia    PAD (peripheral artery disease) (HCC)    PAF (paroxysmal atrial fibrillation) (HCC)    Peripheral arterial occlusive disease (HCC)    lower extremities   Pneumonia    PONV (postoperative nausea and vomiting)    Right ureteral stone    Type 2 diabetes mellitus (HCC)    Type   Vitamin B 12 deficiency    Wears glasses    Past Surgical History:  Procedure Laterality Date   ABDOMINAL AORTOGRAM W/LOWER EXTREMITY N/A 03/09/2020   Procedure: ABDOMINAL AORTOGRAM W/LOWER EXTREMITY;  Surgeon:  Nada Libman, MD;  [facility];  Service: Cardiovascular;  Laterality: N/A;   AMPUTATION Left 07/16/2014   Procedure: LEFT SECOND TOE AMPUTATION;  Surgeon: Nada Libman, MD;  Location: [facility] OR;  Service: Vascular;  Laterality: Left;  With Nerve block   ATRIAL FIBRILLATION ABLATION N/A 08/02/2021   Procedure: ATRIAL FIBRILLATION ABLATION;  Surgeon: Regan Lemming, MD;  Location: [facility];  Service: Cardiovascular;  Laterality: N/A;   AUGMENTATION MAMMAPLASTY     BELPHAROPTOSIS REPAIR     eyelid lift   BIOPSY  10/14/2018   Procedure: BIOPSY;  Surgeon: Meridee Score Netty Starring., MD;  Location: [facility] ENDOSCOPY;  Service: Gastroenterology;;   BIOPSY  08/04/2019   Procedure: BIOPSY;  Surgeon: Lemar Lofty., MD;  Location: [facility] ENDOSCOPY;  Service: Gastroenterology;;   CARDIOVERSION N/A 09/19/2019   Procedure: CARDIOVERSION;  Surgeon: Jodelle Red, MD;  Location: [facility] ENDOSCOPY;  Service: Cardiovascular;  Laterality: N/A;   COLONOSCOPY     COMBINED AUGMENTATION MAMMAPLASTY AND ABDOMINOPLASTY  2009   W/  BILATERAL  THIGH LIFT   CYSTO/  RIGHT URETERAL STENT PLACEMENT  12/30/2010   CYSTOSCOPY WITH RETROGRADE PYELOGRAM, URETEROSCOPY AND STENT PLACEMENT Right 10/07/2013   Procedure: CYSTOSCOPY WITH RETROGRADE PYELOGRAM, right URETEROSCOPY AND STENT PLACEMENT, stone extraction;  Surgeon: Danae Chen, MD;  Location: [facility];  Service: Urology;  Laterality: Right;  CYSTOSCOPY WITH STENT PLACEMENT Right 06/04/2013   Procedure: CYSTOSCOPY WITH STENT PLACEMENT;  Surgeon: Marcine Matar, MD;  Location: [facility] ORS;  Service: Urology;  Laterality: Right;   DILATATION & CURETTAGE/HYSTEROSCOPY WITH MYOSURE N/A 04/27/2015   Procedure: DILATATION & CURETTAGE/HYSTEROSCOPY WITH MYOSURE;  Surgeon: Ok Edwards, MD;  Location: [facility] ORS;  Service: Gynecology;  Laterality: N/A;   ENDARTERECTOMY FEMORAL Left 06/08/2014   Procedure: Left Leg Common  Femoral and External Iliac  Endartarectomy with patch Angioplasty;  Surgeon: Larina Earthly, MD;  Location: [facility] OR;  Service: Vascular;  Laterality: Left;   ESOPHAGOGASTRODUODENOSCOPY N/A 10/14/2018   Procedure: ESOPHAGOGASTRODUODENOSCOPY (EGD);  Surgeon: Lemar Lofty., MD;  Location: [facility] ENDOSCOPY;  Service: Gastroenterology;  Laterality: N/A;   ESOPHAGOGASTRODUODENOSCOPY (EGD) WITH PROPOFOL N/A 11/06/2018   Procedure: ESOPHAGOGASTRODUODENOSCOPY (EGD) WITH PROPOFOL;  Surgeon: Meridee Score Netty Starring., MD;  Location: [facility] ENDOSCOPY;  Service: Gastroenterology;  Laterality: N/A;  RFA   ESOPHAGOGASTRODUODENOSCOPY (EGD) WITH PROPOFOL N/A 02/12/2019   Procedure: ESOPHAGOGASTRODUODENOSCOPY (EGD) WITH PROPOFOL;  Surgeon: Meridee Score Netty Starring., MD;  Location: [facility] ENDOSCOPY;  Service: Gastroenterology;  Laterality: N/A;   ESOPHAGOGASTRODUODENOSCOPY (EGD) WITH PROPOFOL N/A 08/04/2019   Procedure: ESOPHAGOGASTRODUODENOSCOPY (EGD) WITH PROPOFOL;  Surgeon: Meridee Score Netty Starring., MD;  Location: [facility] ENDOSCOPY;  Service: Gastroenterology;  Laterality: N/A;  WITH RFA   EXTRACORPOREAL SHOCK WAVE LITHOTRIPSY Right 08-04-2013//   06-23-2013//   01-16-2011   EYE SURGERY Bilateral    cataract   FEMORAL-POPLITEAL BYPASS GRAFT Left 06/08/2014   Procedure: Left Leg Femoral -Popliteal Bypass Graft;  Surgeon: Larina Earthly, MD;  Location: [facility] OR;  Service: Vascular;  Laterality: Left;   FEMORAL-POPLITEAL BYPASS GRAFT Left 06/09/2014   Procedure: Left Femoral and Popliteal Exposure; Left Femoral to Anterior Tibial Bypass Graft using Propaten 6mm by 80cm Goretex Graft; Left Tibial Endarterectomy; Left Femoraland Popliteal Thrombectomy ;  Surgeon: Nada Libman, MD;  Location: [facility] OR;  Service: Vascular;  Laterality: Left;   GI RADIOFREQUENCY ABLATION N/A 11/06/2018   Procedure: GI RADIOFREQUENCY ABLATION;  Surgeon: Lemar Lofty., MD;  Location: [facility] ENDOSCOPY;  Service: Gastroenterology;  Laterality: N/A;   GI  RADIOFREQUENCY ABLATION N/A 02/12/2019   Procedure: GI RADIOFREQUENCY ABLATION;  Surgeon: Meridee Score Netty Starring., MD;  Location: [facility] ENDOSCOPY;  Service: Gastroenterology;  Laterality: N/A;   GI RADIOFREQUENCY ABLATION N/A 08/04/2019   Procedure: GI RADIOFREQUENCY ABLATION;  Surgeon: Meridee Score Netty Starring., MD;  Location: [facility] ENDOSCOPY;  Service: Gastroenterology;  Laterality: N/A;   HOLMIUM LASER APPLICATION Right 10/07/2013   Procedure: HOLMIUM LASER APPLICATION;  Surgeon: Danae Chen, MD;  Location: [facility];  Service: Urology;  Laterality: Right;   KIDNEY STONE SURGERY  07/2013   1-2 stones   LAPAROSCOPIC GASTRIC BANDING  05/29/2005   LITHOTRIPSY  2-3 times   LOWER EXTREMITY ANGIOGRAM N/A 06/04/2014   Procedure: LOWER EXTREMITY ANGIOGRAM;  Surgeon: Nada Libman, MD;  Location: [facility] CATH LAB;  Service: Cardiovascular;  Laterality: N/A;   ORIF FIFTH METACARPAL FX  RIGHT HAND  04/21/2002   POLYPECTOMY     REVISION AND RE-SITING LAP-BAND PORT  04/08/2010   W/  UPPER EGD   RIGHT KNEE PATELLECTOMY W/ REPAIR OF EXTENSOR MECHANISM  04/14/2002   TEE WITHOUT CARDIOVERSION N/A 12/24/2019   Procedure: TRANSESOPHAGEAL ECHOCARDIOGRAM (TEE);  Surgeon: Quintella Reichert, MD;  Location: [facility] ENDOSCOPY;  Service: Cardiovascular;  Laterality: N/A;   Toenail removed Left 05/21/2014   2nd toenail-  Dr. Elvin So    Current Outpatient Medications  Medication Sig Dispense Refill  Blood Glucose Monitoring Suppl (ONETOUCH VERIO IQ SYSTEM) w/Device KIT USE TO CHECK SUGAR DAILY 1 kit 0   cetirizine (ZYRTEC) 10 MG tablet Take 10 mg by mouth daily as needed for allergies.     diltiazem (CARDIZEM CD) 120 MG 24 hr capsule Take 1 capsule (120 mg total) by mouth daily. 90 capsule 1   diltiazem (CARDIZEM) 30 MG tablet Take 1 tablet every 4 hours AS NEEDED for AFIB heart rate >100 as long as top BP >100. 30 tablet 1   dorzolamide-timolol (COSOPT) 22.3-6.8 MG/ML ophthalmic solution Place 1 drop into the  right eye 2 (two) times daily.      Empagliflozin-metFORMIN HCl (SYNJARDY PO) Take 1 tablet by mouth every morning. Not sure the dosage     ezetimibe (ZETIA) 10 MG tablet Take 10 mg by mouth daily.     hydrALAZINE (APRESOLINE) 50 MG tablet Take 50 mg by mouth 2 (two) times daily.     Insulin Glargine (BASAGLAR KWIKPEN) 100 UNIT/ML Inject 50 Units into the skin at bedtime. Via [employer] patient assistance (Patient taking differently: Inject 40-42 Units into the skin at bedtime. Via [employer] patient assistance taking 46 units) 30 mL 2   insulin lispro (HUMALOG KWIKPEN) 100 UNIT/ML KwikPen Inject 14 Units into the skin 3 (three) times daily. 45 mL 1   Insulin Pen Needle (PEN NEEDLES) 32G X 4 MM MISC Inject 1 pen  as directed 4 (four) times daily. Use to inject levemir or novolog as directed. 400 each 3   latanoprost (XALATAN) 0.005 % ophthalmic solution Place 1 drop into both eyes at bedtime.      losartan (COZAAR) 25 MG tablet Take 25 mg by mouth daily.     magnesium oxide (MAG-OX) 400 (240 Mg) MG tablet TAKE ONE TABLET BY MOUTH EVERYDAY AT BEDTIME 90 tablet 1   nebivolol (BYSTOLIC) 10 MG tablet Take 1 tablet (10 mg total) by mouth daily. 90 tablet 3   omeprazole (PRILOSEC) 40 MG capsule TAKE ONE CAPSULE BY MOUTH EVERY MORNING and TAKE ONE CAPSULE BY MOUTH EVERY EVENING 180 capsule 1   ONETOUCH VERIO test strip USE TO check blood glucose TWICE DAILY 200 strip 3   rosuvastatin (CRESTOR) 5 MG tablet Take 1 tablet (5 mg total) by mouth daily. 90 tablet 3   Semaglutide, 1 MG/DOSE, (OZEMPIC, 1 MG/DOSE,) 4 MG/3ML SOPN Inject 1 mg into the skin once a week. Via [employer] patient assistance 9 mL 1   XARELTO 20 MG TABS tablet Take 1 tablet by mouth once daily 90 tablet 0   empagliflozin (JARDIANCE) 10 MG TABS tablet Take 1 tablet (10 mg total) by mouth daily before breakfast. (Patient not taking: Reported on 09/19/2022) 90 tablet 1   metFORMIN (GLUCOPHAGE-XR) 750 MG 24 hr tablet Take 1 tablet (750 mg  total) by mouth daily with breakfast. (Patient not taking: Reported on 09/19/2022) 90 tablet 1   No current facility-administered medications for this encounter.    Allergies  Allergen Reactions   Amlodipine Swelling   Atorvastatin Other (See Comments)    Muscle aches    Ampicillin Nausea And Vomiting and Other (See Comments)   Codeine Nausea And Vomiting    Ask patient   Lisinopril Cough        Penicillins Nausea And Vomiting    Did it involve swelling of the face/tongue/throat, SOB, or low BP? No Did it involve sudden or severe rash/hives, skin peeling, or any reaction on the inside of your mouth or  nose? No Did you need to seek medical attention at a hospital or doctor's office? No When did it last happen?      20+ years If all above answers are "NO", may proceed with cephalosporin use.    Januvia [Sitagliptin] Nausea And Vomiting    Social History   Socioeconomic History   Marital status: Single    Spouse name: Not on file   Number of children: Not on file   Years of education: Not on file   Highest education level: Not on file  Occupational History   Occupation: Higher education careers adviser: [employer]  Tobacco Use   Smoking status: Former    Packs/day: 1.00    Years: 28.00    Additional pack years: 0.00    Total pack years: 28.00    Types: Cigarettes    Quit date: 09/05/2001    Years since quitting: 21.0    Passive exposure: Never   Smokeless tobacco: Never  Vaping Use   Vaping Use: Never used  Substance and Sexual Activity   Alcohol use: No    Alcohol/week: 0.0 standard drinks of alcohol   Drug use: No   Sexual activity: Yes  Other Topics Concern   Not on file  Social History Narrative   Regular exercise- yes   Social Determinants of Health   Financial Resource Strain: Low Risk  (12/07/2021)   Overall Financial Resource Strain (CARDIA)    Difficulty of Paying Living Expenses: Not hard at all  Food Insecurity: No Food Insecurity (12/07/2021)   Hunger  Vital Sign    Worried About Running Out of Food in the Last Year: Never true    Ran Out of Food in the Last Year: Never true  Transportation Needs: No Transportation Needs (12/07/2021)   PRAPARE - Administrator, Civil Service (Medical): No    Lack of Transportation (Non-Medical): No  Physical Activity: Inactive (12/07/2021)   Exercise Vital Sign    Days of Exercise per Week: 0 days    Minutes of Exercise per Session: 0 min  Stress: No Stress Concern Present (12/07/2021)   [employer] of Occupational Health - Occupational Stress Questionnaire    Feeling of Stress : Not at all  Social Connections: Moderately Integrated (12/07/2021)   Social Connection and Isolation Panel [NHANES]    Frequency of Communication with Friends and Family: More than three times a week    Frequency of Social Gatherings with Friends and Family: More than three times a week    Attends Religious Services: More than 4 times per year    Active Member of [employer] or Organizations: Yes    Attends Engineer, structural: More than 4 times per year    Marital Status: Never married  Intimate Partner Violence: Not At Risk (12/07/2021)   Humiliation, Afraid, Rape, and Kick questionnaire    Fear of Current or Ex-Partner: No    Emotionally Abused: No    Physically Abused: No    Sexually Abused: No    Family History  Problem Relation Age of Onset   Kidney disease Mother    Hyperlipidemia Mother    Other Mother        AAA   and    Amputation   Diabetes Father    Heart disease Father    Deep vein thrombosis Father    Hyperlipidemia Father    Diabetes Sister    Heart disease Sister    Deep vein thrombosis Sister  Hyperlipidemia Sister    Diabetes Brother    Heart disease Brother    Cancer Brother    Hyperlipidemia Brother    Diabetes Brother    Diabetes Brother    Colon polyps Brother    Colon cancer Maternal Aunt    Arthritis Other    Cancer Other        colon   Hypertension Other     Stroke Other    Esophageal cancer Neg Hx    Stomach cancer Neg Hx    Inflammatory bowel disease Neg Hx    Liver disease Neg Hx    Pancreatic cancer Neg Hx    Crohn's disease Neg Hx    Rectal cancer Neg Hx     ROS- All systems are reviewed and negative except as per the HPI above  Physical Exam: Vitals:   09/19/22 0917  BP: 120/80  Pulse: (!) 141  Weight: 76.7 kg  Height: 5\' 4"  (1.626 m)    Wt Readings from Last 3 Encounters:  09/19/22 76.7 kg  09/13/22 74.8 kg  08/23/22 75.1 kg    Labs: Lab Results  Component Value Date   NA 140 09/13/2022   K 3.6 09/13/2022   CL 104 09/13/2022   CO2 28 09/13/2022   GLUCOSE 211 (H) 09/13/2022   BUN 11 09/13/2022   CREATININE 0.75 09/13/2022   CALCIUM 9.2 09/13/2022   MG 1.8 09/04/2019   Lab Results  Component Value Date   INR 1.3 (H) 03/08/2020   Lab Results  Component Value Date   CHOL 111 09/13/2022   HDL 48.10 09/13/2022   LDLCALC 39 09/13/2022   TRIG 118.0 09/13/2022    GEN- The patient is a well appearing female, alert and oriented x 3 today.   HEENT-head normocephalic, atraumatic, sclera clear, conjunctiva pink, hearing intact, trachea midline. Lungs- Clear to ausculation bilaterally, normal work of breathing Heart- Regular rate and rhythm, tachycardia, no murmurs, rubs or gallops  GI- soft, NT, ND, + BS Extremities- no clubbing, cyanosis, or edema MS- no significant deformity or atrophy Skin- no rash or lesion Psych- euthymic mood, full affect Neuro- strength and sensation are intact   EKG today demonstrates Atypical atrial flutter with 2:1 block Vent. rate 141 BPM PR interval 94 ms QRS duration 72 ms QT/QTcB 298/456 ms   CHA2DS2-VASc Score = 5  The patient's score is based upon: CHF History: 0 HTN History: 1 Diabetes History: 1 Stroke History: 0 Vascular Disease History: 1 Age Score: 1 Gender Score: 1       ASSESSMENT AND PLAN: 1. Persistent Atrial Fibrillation/atrial flutter The patient's  CHA2DS2-VASc score is 5, indicating a 7.2% annual risk of stroke.   S/p afib ablation 08/02/21 Patient in rapid atrial flutter, minimally symptomatic. We discussed rhythm control options. Will plan for DCCV after 3 weeks of uninterrupted anticoagulation (missed dose 5/13). If she becomes symptomatic, can consider adding TEE to cardiovert sooner. Long term, patient is interested in visit with Dr Elberta Fortis to discuss candidacy for repeat ablation.  Continue diltiazem 120 mg daily with 30 mg q 4 hours PRN for heart racing. Continue nebivolol 10 mg daily Continue Xarelto 20 mg daily   2. Secondary Hypercoagulable State (ICD10:  D68.69) The patient is at significant risk for stroke/thromboembolism based upon her CHA2DS2-VASc Score of 5.  Continue Rivaroxaban (Xarelto).   3. HTN Stable, no changes today.  4. CAD CAC score on CT 704 On statin No anginal symptoms.  5. PAD S/p fem pop bypass 2016  Followed by [facility]  6. VHD Mild-moderate MS Followed by Dr Anne Fu    Follow up with Dr Elberta Fortis post DCCV.    Jorja Loa PA-C Af[facility] [address] [place], [place] [address] [phone]

## 2022-09-22 ENCOUNTER — Other Ambulatory Visit: Payer: Self-pay | Admitting: Internal Medicine

## 2022-09-22 DIAGNOSIS — Z794 Long term (current) use of insulin: Secondary | ICD-10-CM

## 2022-09-25 NOTE — Pre-Procedure Instructions (Signed)
Patient takes [place].  These drugs have to be held prior to anesthesia case scheduled on Tuesday 10/03/22.  Ozempic- last dose on or before Monday 09/25/22  Jardiance-last dose on Friday 09/29/22

## 2022-09-26 ENCOUNTER — Telehealth: Payer: Self-pay | Admitting: Cardiology

## 2022-09-26 NOTE — Telephone Encounter (Signed)
Patient call came straight to triage. Patient is complaining of elevated HR 130's and BLE edema, non-pitting. Patient stated she has been taking cardizem 30 mg every 4 hours for about 2 weeks for HR over 100. Patient is also [place] cardizem 120 mg daily, hydralaine 50 mg BID, losartan 50 mg, and bystolic 10. Patient's BP is fine at 120/72. Patient denies any SOB or chest pain. Encouraged patient to elevate her legs and reduce salt in diet. Patient has scheduled Cardioversion next Tuesday. Encouraged patient to go to ED if her symptoms get worse or she starts having SOB and/or chest pain. Will forward to Robin Searing, NP for further advisement since he just saw patient last week.

## 2022-09-26 NOTE — Telephone Encounter (Signed)
Patient c/o Palpitations:  High priority if patient c/o lightheadedness, shortness of breath, or chest pain  How long have you had palpitations/irregular HR/ Afib? Are you having the symptoms now? Has been in Afib for 2 weeks   Are you currently experiencing lightheadedness, SOB or CP? No   Do you have a history of afib (atrial fibrillation) or irregular heart rhythm? Yes  Have you checked your BP or HR? (document readings if available): BP 120/72 HR 133 this morning   Are you experiencing any other symptoms? Leg swelling

## 2022-09-26 NOTE — Telephone Encounter (Signed)
Left message for patient to call back  

## 2022-09-27 ENCOUNTER — Other Ambulatory Visit (HOSPITAL_COMMUNITY): Payer: Self-pay | Admitting: *Deleted

## 2022-09-27 ENCOUNTER — Other Ambulatory Visit: Payer: Self-pay | Admitting: Internal Medicine

## 2022-09-27 ENCOUNTER — Telehealth: Payer: Self-pay | Admitting: Internal Medicine

## 2022-09-27 DIAGNOSIS — H53032 Strabismic amblyopia, left eye: Secondary | ICD-10-CM | POA: Diagnosis not present

## 2022-09-27 DIAGNOSIS — Z961 Presence of intraocular lens: Secondary | ICD-10-CM | POA: Diagnosis not present

## 2022-09-27 DIAGNOSIS — E1151 Type 2 diabetes mellitus with diabetic peripheral angiopathy without gangrene: Secondary | ICD-10-CM

## 2022-09-27 DIAGNOSIS — E119 Type 2 diabetes mellitus without complications: Secondary | ICD-10-CM | POA: Diagnosis not present

## 2022-09-27 DIAGNOSIS — I484 Atypical atrial flutter: Secondary | ICD-10-CM

## 2022-09-27 DIAGNOSIS — H26491 Other secondary cataract, right eye: Secondary | ICD-10-CM | POA: Diagnosis not present

## 2022-09-27 DIAGNOSIS — H401113 Primary open-angle glaucoma, right eye, severe stage: Secondary | ICD-10-CM | POA: Diagnosis not present

## 2022-09-27 DIAGNOSIS — D3131 Benign neoplasm of right choroid: Secondary | ICD-10-CM | POA: Diagnosis not present

## 2022-09-27 LAB — HM DIABETES EYE EXAM

## 2022-09-27 MED ORDER — SYNJARDY 5-500 MG PO TABS
1.0000 | ORAL_TABLET | Freq: Two times a day (BID) | ORAL | 0 refills | Status: DC
Start: 2022-09-27 — End: 2023-03-16

## 2022-09-27 NOTE — Telephone Encounter (Signed)
Pt called wanted know why Dr. Yetta Barre switch her to  Empagliflozin-metFORMIN HCl (SYNJARDY PO)  when the medicating is the same price and with the other medication you have to take 2 pills instead of one. Please advise.

## 2022-09-28 ENCOUNTER — Other Ambulatory Visit: Payer: Self-pay | Admitting: Internal Medicine

## 2022-09-29 ENCOUNTER — Telehealth (HOSPITAL_COMMUNITY): Payer: Self-pay | Admitting: *Deleted

## 2022-09-29 ENCOUNTER — Other Ambulatory Visit: Payer: Self-pay | Admitting: Internal Medicine

## 2022-09-29 NOTE — Telephone Encounter (Signed)
Patient back in NSR. Cardioversion canceled.

## 2022-09-30 ENCOUNTER — Other Ambulatory Visit: Payer: Self-pay | Admitting: Internal Medicine

## 2022-09-30 ENCOUNTER — Other Ambulatory Visit: Payer: Self-pay | Admitting: Cardiology

## 2022-09-30 DIAGNOSIS — I4891 Unspecified atrial fibrillation: Secondary | ICD-10-CM

## 2022-10-03 ENCOUNTER — Encounter (HOSPITAL_COMMUNITY): Admission: RE | Payer: Self-pay | Source: Home / Self Care

## 2022-10-03 ENCOUNTER — Ambulatory Visit (HOSPITAL_COMMUNITY): Admission: RE | Admit: 2022-10-03 | Payer: PPO | Source: Home / Self Care | Admitting: Cardiology

## 2022-10-03 SURGERY — CARDIOVERSION
Anesthesia: General

## 2022-10-23 ENCOUNTER — Ambulatory Visit: Payer: PPO | Admitting: Cardiology

## 2022-11-28 DIAGNOSIS — Z8631 Personal history of diabetic foot ulcer: Secondary | ICD-10-CM | POA: Diagnosis not present

## 2022-11-28 DIAGNOSIS — Z89422 Acquired absence of other left toe(s): Secondary | ICD-10-CM | POA: Diagnosis not present

## 2022-11-28 DIAGNOSIS — E1151 Type 2 diabetes mellitus with diabetic peripheral angiopathy without gangrene: Secondary | ICD-10-CM | POA: Diagnosis not present

## 2022-11-28 DIAGNOSIS — L89891 Pressure ulcer of other site, stage 1: Secondary | ICD-10-CM | POA: Diagnosis not present

## 2022-12-03 ENCOUNTER — Other Ambulatory Visit: Payer: Self-pay | Admitting: Internal Medicine

## 2022-12-03 DIAGNOSIS — E119 Type 2 diabetes mellitus without complications: Secondary | ICD-10-CM

## 2022-12-09 ENCOUNTER — Other Ambulatory Visit: Payer: Self-pay | Admitting: Internal Medicine

## 2022-12-09 DIAGNOSIS — I4891 Unspecified atrial fibrillation: Secondary | ICD-10-CM

## 2022-12-21 ENCOUNTER — Telehealth: Payer: Self-pay | Admitting: Internal Medicine

## 2022-12-21 NOTE — Telephone Encounter (Signed)
Called pt back and confirmed she does not have any Ozempic to pick up.

## 2022-12-21 NOTE — Telephone Encounter (Signed)
Patient wants to know if she has any ozempic here to pick up.  Please give her a call - [phone]

## 2022-12-29 ENCOUNTER — Ambulatory Visit: Payer: PPO | Admitting: Cardiology

## 2023-01-09 ENCOUNTER — Other Ambulatory Visit (HOSPITAL_COMMUNITY): Payer: Self-pay

## 2023-01-25 ENCOUNTER — Other Ambulatory Visit: Payer: Self-pay | Admitting: Pharmacist

## 2023-01-25 NOTE — Progress Notes (Signed)
Pharmacy Quality Measure Review  This patient is appearing on a report for being at risk of failing the adherence measure for diabetes medications this calendar year.   Medication: Synjardy 5-500 mg 1 tablet twice daily Last fill date: 09/28/2022 for 90 day supply  Called patient. She confirms she is still taking and has gotten a refill since 09/18/2022, however could not confirm with date on most recent bottle.  She does note the cost is high and already gets Ozempic through PAP. Patient does meet income eligibility criteria for BI Cares. Will send request to initiate PAP to Med assistance team.  Arbutus Leas, PharmD, BCPS [facility] [phone]

## 2023-01-30 ENCOUNTER — Telehealth: Payer: Self-pay

## 2023-01-30 NOTE — Telephone Encounter (Signed)
-----   Message from Perley Jain sent at 01/25/2023  4:00 PM EDT ----- Please initiate application for BI Cares for Synjardy 5-500 mg 1 tablet twice daily. Pt reports income ~$30k and household of 1

## 2023-01-30 NOTE — Telephone Encounter (Signed)
Mailing BI Cares application to patients home for assistance with Synjardy medication.   Faxing provider pages to pcp.

## 2023-02-06 ENCOUNTER — Telehealth: Payer: Self-pay | Admitting: Internal Medicine

## 2023-02-06 ENCOUNTER — Other Ambulatory Visit: Payer: Self-pay | Admitting: Internal Medicine

## 2023-02-06 DIAGNOSIS — K2100 Gastro-esophageal reflux disease with esophagitis, without bleeding: Secondary | ICD-10-CM

## 2023-02-06 DIAGNOSIS — I10 Essential (primary) hypertension: Secondary | ICD-10-CM

## 2023-02-06 DIAGNOSIS — E785 Hyperlipidemia, unspecified: Secondary | ICD-10-CM

## 2023-02-06 MED ORDER — EZETIMIBE 10 MG PO TABS: 10.0000 mg | ORAL_TABLET | Freq: Every day | ORAL | 0 refills | Status: DC

## 2023-02-06 MED ORDER — OMEPRAZOLE 40 MG PO CPDR: 40.0000 mg | DELAYED_RELEASE_CAPSULE | Freq: Every day | ORAL | 0 refills | Status: DC

## 2023-02-06 NOTE — Telephone Encounter (Signed)
Prescription Request  02/06/2023  LOV: 09/13/2022  What is the name of the medication or equipment? Omeprazole and ezetimibe  Have you contacted your pharmacy to request a refill? Yes   Which pharmacy would you like this sent to?  [facility] [place] - [address] [facility] [place] [address] Phone: [phone] Fax: [phone]     Patient notified that their request is being sent to the clinical staff for review and that they should receive a response within 2 business days.   Please advise at Mobile [phone] (mobile)

## 2023-02-07 ENCOUNTER — Other Ambulatory Visit: Payer: Self-pay | Admitting: Internal Medicine

## 2023-02-07 DIAGNOSIS — K5904 Chronic idiopathic constipation: Secondary | ICD-10-CM

## 2023-02-07 DIAGNOSIS — I10 Essential (primary) hypertension: Secondary | ICD-10-CM

## 2023-02-07 MED ORDER — MAGNESIUM OXIDE -MG SUPPLEMENT 400 (240 MG) MG PO TABS: 1.0000 | ORAL_TABLET | Freq: Every day | ORAL | 0 refills | Status: DC

## 2023-02-12 ENCOUNTER — Other Ambulatory Visit: Payer: Self-pay | Admitting: Internal Medicine

## 2023-02-12 DIAGNOSIS — E119 Type 2 diabetes mellitus without complications: Secondary | ICD-10-CM

## 2023-02-16 NOTE — Progress Notes (Signed)
This encounter was created in error - please disregard.  I called patient twice with no answer.  I left a detailed message for patient to call office back to reschedule.

## 2023-02-19 ENCOUNTER — Other Ambulatory Visit: Payer: Self-pay | Admitting: Internal Medicine

## 2023-02-19 DIAGNOSIS — I10 Essential (primary) hypertension: Secondary | ICD-10-CM

## 2023-02-20 ENCOUNTER — Encounter: Payer: Self-pay | Admitting: Internal Medicine

## 2023-02-20 ENCOUNTER — Other Ambulatory Visit: Payer: Self-pay | Admitting: Pharmacist

## 2023-02-20 ENCOUNTER — Ambulatory Visit: Payer: PPO | Admitting: Internal Medicine

## 2023-02-20 VITALS — BP 152/70 | HR 78 | Temp 97.5°F | Resp 16 | Ht 64.0 in | Wt 169.6 lb

## 2023-02-20 DIAGNOSIS — Z23 Encounter for immunization: Secondary | ICD-10-CM

## 2023-02-20 DIAGNOSIS — N182 Chronic kidney disease, stage 2 (mild): Secondary | ICD-10-CM

## 2023-02-20 DIAGNOSIS — E119 Type 2 diabetes mellitus without complications: Secondary | ICD-10-CM

## 2023-02-20 DIAGNOSIS — E538 Deficiency of other specified B group vitamins: Secondary | ICD-10-CM | POA: Diagnosis not present

## 2023-02-20 DIAGNOSIS — E785 Hyperlipidemia, unspecified: Secondary | ICD-10-CM | POA: Diagnosis not present

## 2023-02-20 DIAGNOSIS — I4892 Unspecified atrial flutter: Secondary | ICD-10-CM | POA: Diagnosis not present

## 2023-02-20 DIAGNOSIS — I10 Essential (primary) hypertension: Secondary | ICD-10-CM

## 2023-02-20 DIAGNOSIS — G63 Polyneuropathy in diseases classified elsewhere: Secondary | ICD-10-CM | POA: Diagnosis not present

## 2023-02-20 DIAGNOSIS — Z794 Long term (current) use of insulin: Secondary | ICD-10-CM | POA: Diagnosis not present

## 2023-02-20 DIAGNOSIS — D509 Iron deficiency anemia, unspecified: Secondary | ICD-10-CM | POA: Diagnosis not present

## 2023-02-20 DIAGNOSIS — I4891 Unspecified atrial fibrillation: Secondary | ICD-10-CM | POA: Diagnosis not present

## 2023-02-20 HISTORY — DX: Unspecified atrial flutter: I48.92

## 2023-02-20 LAB — BASIC METABOLIC PANEL
BUN: 13 mg/dL (ref 6–23)
CO2: 26 meq/L (ref 19–32)
Calcium: 9.5 mg/dL (ref 8.4–10.5)
Chloride: 105 meq/L (ref 96–112)
Creatinine, Ser: 0.91 mg/dL (ref 0.40–1.20)
GFR: 65.98 mL/min (ref >60.00)
Glucose, Bld: 181 mg/dL — ABNORMAL HIGH (ref 70–99)
Potassium: 4 meq/L (ref 3.5–5.1)
Sodium: 140 meq/L (ref 135–145)

## 2023-02-20 LAB — CBC WITH DIFFERENTIAL/PLATELET
Basophils Absolute: 0.1 10*3/uL (ref 0.0–0.1)
Basophils Relative: 0.5 % (ref 0.0–3.0)
Eosinophils Absolute: 0.1 10*3/uL (ref 0.0–0.7)
Eosinophils Relative: 0.7 % (ref 0.0–5.0)
HCT: 38.8 % (ref 36.0–46.0)
Hemoglobin: 11.9 g/dL — ABNORMAL LOW (ref 12.0–15.0)
Lymphocytes Relative: 29.6 % (ref 12.0–46.0)
Lymphs Abs: 3 10*3/uL (ref 0.7–4.0)
MCHC: 30.8 g/dL (ref 30.0–36.0)
MCV: 77.3 fL — ABNORMAL LOW (ref 78.0–100.0)
Monocytes Absolute: 0.7 10*3/uL (ref 0.1–1.0)
Monocytes Relative: 6.7 % (ref 3.0–12.0)
Neutro Abs: 6.3 10*3/uL (ref 1.4–7.7)
Neutrophils Relative %: 62.5 % (ref 43.0–77.0)
Platelets: 320 10*3/uL (ref 150.0–400.0)
RBC: 5.02 Mil/uL (ref 3.87–5.11)
RDW: 18.4 % — ABNORMAL HIGH (ref 11.5–15.5)
WBC: 10.1 10*3/uL (ref 4.0–10.5)

## 2023-02-20 LAB — FOLATE: Folate: 15.4 ng/mL (ref >5.9)

## 2023-02-20 LAB — HEPATIC FUNCTION PANEL
ALT: 16 U/L (ref 0–35)
AST: 12 U/L (ref 0–37)
Albumin: 4.1 g/dL (ref 3.5–5.2)
Alkaline Phosphatase: 61 U/L (ref 39–117)
Bilirubin, Direct: 0.1 mg/dL (ref 0.0–0.3)
Total Bilirubin: 0.3 mg/dL (ref 0.2–1.2)
Total Protein: 6.9 g/dL (ref 6.0–8.3)

## 2023-02-20 LAB — URINALYSIS, ROUTINE W REFLEX MICROSCOPIC
Bilirubin Urine: NEGATIVE
Hgb urine dipstick: NEGATIVE
Ketones, ur: NEGATIVE
Leukocytes,Ua: NEGATIVE
Nitrite: NEGATIVE
RBC / HPF: NONE SEEN (ref 0–?)
Specific Gravity, Urine: 1.02 (ref 1.000–1.030)
Total Protein, Urine: NEGATIVE
Urine Glucose: >=1000 — AB
Urobilinogen, UA: 1 (ref 0.0–1.0)
WBC, UA: NONE SEEN (ref 0–?)
pH: 6 (ref 5.0–8.0)

## 2023-02-20 LAB — MICROALBUMIN / CREATININE URINE RATIO
Creatinine,U: 68.2 mg/dL
Microalb Creat Ratio: 1 mg/g (ref 0.0–30.0)
Microalb, Ur: <0.7 mg/dL (ref 0.0–1.9)

## 2023-02-20 LAB — HEMOGLOBIN A1C: Hgb A1c MFr Bld: 8.4 % — ABNORMAL HIGH (ref 4.6–6.5)

## 2023-02-20 LAB — VITAMIN B12: Vitamin B-12: 216 pg/mL (ref 211–911)

## 2023-02-20 MED ORDER — BASAGLAR KWIKPEN 100 UNIT/ML ~~LOC~~ SOPN: 46.0000 [IU] | PEN_INJECTOR | Freq: Every day | SUBCUTANEOUS | 3 refills | Status: DC

## 2023-02-20 MED ORDER — DILTIAZEM HCL ER COATED BEADS 120 MG PO CP24: 120.0000 mg | ORAL_CAPSULE | Freq: Every day | ORAL | 0 refills | Status: DC

## 2023-02-20 MED ORDER — HYDRALAZINE HCL 50 MG PO TABS: 50.0000 mg | ORAL_TABLET | Freq: Three times a day (TID) | ORAL | 0 refills | Status: DC

## 2023-02-20 NOTE — Progress Notes (Signed)
Subjective:  Patient ID: Theresa Harrington, female    DOB: 1955-05-04  Age: 67 y.o. MRN: [id number]  CC: Anemia, Hypertension, and Hyperlipidemia   HPI [name] presents for f/up ----  Discussed the use of AI scribe software for clinical note transcription with the patient, who gave verbal consent to proceed.  History of Present Illness   The patient, with a history of atrial fibrillation, presented with a recent change in cardiac rhythm. They reported feeling weak and slightly dizzy on the first day of the new rhythm, but these symptoms have since subsided. The patient noted their heart rate has been fluctuating, with the highest recorded rate being around 103-109 bpm, compared to their usual rate of around 60 bpm when not in atrial fibrillation.  The patient also has a known B12 deficiency but has not been consistent with their B12 injections. Despite this, they denied experiencing any symptoms of B12 deficiency such as numbness, tingling, fatigue, or cognitive fog.  In addition, the patient has been experiencing elevated morning blood glucose levels, which they attribute to late-night eating habits due to their work schedule. They requested an A1c test to assess their glycemic control.  The patient also reported a recent increase in blood pressure, which they have been monitoring at home. They have been taking nebivolol and Cardizem for blood pressure control, but they ran out of refills for losartan and it was not refilled.       Outpatient Medications Prior to Visit  Medication Sig Dispense Refill   diltiazem (CARDIZEM) 30 MG tablet Take 1 tablet every 4 hours AS NEEDED for AFIB heart rate >100 as long as top BP >100. 30 tablet 1   dorzolamide-timolol (COSOPT) 22.3-6.8 MG/ML ophthalmic solution Place 1 drop into the right eye 2 (two) times daily.      Empagliflozin-metFORMIN HCl (SYNJARDY) 5-500 MG TABS Take 1 tablet by mouth 2 (two) times daily. 180 tablet 0   ezetimibe (ZETIA) 10 MG  tablet Take 1 tablet (10 mg total) by mouth daily. 90 tablet 0   HUMALOG KWIKPEN 100 UNIT/ML KwikPen DIAL 14 UNITS AND INJECT UNDER THE SKIN THREE TIMES A DAY. 45 mL 0   Insulin Pen Needle (PEN NEEDLES) 32G X 4 MM MISC Inject 1 pen  as directed 4 (four) times daily. Use to inject levemir or novolog as directed. 400 each 3   latanoprost (XALATAN) 0.005 % ophthalmic solution Place 1 drop into both eyes at bedtime.      MAGnesium-Oxide 400 (240 Mg) MG tablet Take 1 tablet (400 mg total) by mouth daily. 90 tablet 0   nebivolol (BYSTOLIC) 10 MG tablet Take 1 tablet by mouth once daily 90 tablet 3   omeprazole (PRILOSEC) 40 MG capsule Take 1 capsule (40 mg total) by mouth daily. 90 capsule 0   rosuvastatin (CRESTOR) 5 MG tablet Take 1 tablet (5 mg total) by mouth daily. 90 tablet 3   Semaglutide, 1 MG/DOSE, (OZEMPIC, 1 MG/DOSE,) 4 MG/3ML SOPN Inject 1 mg into the skin once a week. Via [employer] patient assistance 9 mL 1   XARELTO 20 MG TABS tablet Take 1 tablet by mouth once daily 90 tablet 0   CARTIA XT 120 MG 24 hr capsule Take 1 capsule by mouth once daily 90 capsule 0   hydrALAZINE (APRESOLINE) 50 MG tablet TAKE 1 TABLET BY MOUTH THREE TIMES DAILY 270 tablet 0   Insulin Glargine (BASAGLAR KWIKPEN) 100 UNIT/ML Inject 50 Units into the skin at bedtime. Via [employer] patient assistance (Patient taking differently: Inject 40-42 Units into the skin at bedtime. Via [employer] patient assistance taking 46 units) 30 mL 2   losartan (COZAAR) 25 MG tablet Take 25 mg by mouth daily.     Blood Glucose Monitoring Suppl (ONETOUCH VERIO IQ SYSTEM) w/Device KIT USE TO CHECK SUGAR DAILY (Patient not taking: Reported on 02/20/2023) 1 kit 0   cetirizine (ZYRTEC) 10 MG tablet Take 10 mg by mouth daily as needed for allergies. (Patient not taking: Reported on 02/20/2023)     ONETOUCH VERIO test strip USE TO check blood glucose TWICE DAILY (Patient not taking: Reported on 02/20/2023) 200 strip 3   No  facility-administered medications prior to visit.    ROS Review of Systems  Constitutional:  Negative for appetite change, chills, diaphoresis and fatigue.  HENT: Negative.    Eyes:  Negative for visual disturbance.  Respiratory: Negative.  Negative for cough, chest tightness, shortness of breath and wheezing.   Cardiovascular:  Positive for palpitations. Negative for chest pain and leg swelling.  Gastrointestinal:  Negative for abdominal pain, constipation, diarrhea, nausea and vomiting.  Genitourinary: Negative.  Negative for difficulty urinating.  Musculoskeletal: Negative.  Negative for back pain and myalgias.  Skin: Negative.   Neurological: Negative.  Negative for dizziness, weakness and light-headedness.  Hematological:  Negative for adenopathy. Does not bruise/bleed easily.  Psychiatric/Behavioral: Negative.      Objective:  BP (!) 152/70   Pulse 78   Temp (!) 97.5 F (36.4 C) (Oral)   Resp 16   Ht 5\' 4"  (1.626 m)   Wt 169 lb 9.6 oz (76.9 kg)   LMP  (LMP Unknown)   SpO2 99%   BMI 29.11 kg/m   BP Readings from Last 3 Encounters:  02/20/23 (!) 152/70  09/19/22 120/80  09/13/22 134/82    Wt Readings from Last 3 Encounters:  02/20/23 169 lb 9.6 oz (76.9 kg)  09/19/22 169 lb 3.2 oz (76.7 kg)  09/13/22 165 lb (74.8 kg)    Physical Exam Vitals reviewed.  Constitutional:      Appearance: Normal appearance.  HENT:     Mouth/Throat:     Mouth: Mucous membranes are moist.  Eyes:     General: No scleral icterus.    Conjunctiva/sclera: Conjunctivae normal.  Cardiovascular:     Rate and Rhythm: Normal rate. Rhythm regularly irregular.     Pulses: Normal pulses.     Heart sounds: No murmur heard.    No friction rub. No gallop.     Comments: EKG- Atrial flutter with 4:1 AV conduction, 72 bpm No LVH, Q waves, or ST/T waves  Pulmonary:     Effort: Pulmonary effort is normal.     Breath sounds: No stridor. No wheezing, rhonchi or rales.  Abdominal:     General:  Abdomen is flat.     Palpations: There is no mass.     Tenderness: There is no abdominal tenderness. There is no guarding.     Hernia: No hernia is present.  Musculoskeletal:     Cervical back: Neck supple.     Right lower leg: No edema.     Left lower leg: No edema.  Skin:    General: Skin is warm and dry.  Neurological:     General: No focal deficit present.     Mental Status: She is alert and oriented to person, place, and time.     Lab Results  Component Value Date   WBC 10.1 02/20/2023  HGB 11.9 (L) 02/20/2023   HCT 38.8 02/20/2023   PLT 320.0 02/20/2023   GLUCOSE 181 (H) 02/20/2023   CHOL 111 09/13/2022   TRIG 118.0 09/13/2022   HDL 48.10 09/13/2022   LDLDIRECT 160.2 03/01/2010   LDLCALC 39 09/13/2022   ALT 16 02/20/2023   AST 12 02/20/2023   NA 140 02/20/2023   K 4.0 02/20/2023   CL 105 02/20/2023   CREATININE 0.91 02/20/2023   BUN 13 02/20/2023   CO2 26 02/20/2023   TSH 0.58 09/13/2022   INR 1.3 (H) 03/08/2020   HGBA1C 8.4 (H) 02/20/2023   MICROALBUR <0.7 02/20/2023    No results found.  Assessment & Plan:  Essential hypertension, benign- SBP is high. Will restart hydralazine. -     Hepatic function panel; Future -     hydrALAZINE HCl; Take 1 tablet (50 mg total) by mouth 3 (three) times daily.  Dispense: 270 tablet; Refill: 0 -     EKG 12-Lead  Hyperlipidemia with target LDL less than 100 -     Hepatic function panel; Future  Chronic renal disease, stage 2, mildly decreased glomerular filtration rate (GFR) between 60-89 mL/min/1.73 square meter -     Basic metabolic panel; Future -     Microalbumin / creatinine urine ratio; Future -     Urinalysis, Routine w reflex microscopic; Future  Insulin-requiring or dependent type II diabetes mellitus (HCC)- A1c is up to 8.4%.  Will increase the dose of basal insulin. -     Basic metabolic panel; Future -     Microalbumin / creatinine urine ratio; Future -     Hemoglobin A1c; Future -     HM Diabetes Foot  Exam -     Basaglar KwikPen; Inject 60 Units into the skin daily.  Dispense: 54 mL; Refill: 1  Vitamin B12 deficiency neuropathy (HCC) -     CBC with Differential/Platelet; Future -     Vitamin B12; Future -     Folate; Future  Iron deficiency anemia, unspecified iron deficiency anemia type -     CBC with Differential/Platelet; Future  Atrial flutter, paroxysmal (HCC) -     Ambulatory referral to Cardiology -     dilTIAZem HCl ER Coated Beads; Take 1 capsule (120 mg total) by mouth daily.  Dispense: 90 capsule; Refill: 0 -     EKG 12-Lead  Atrial fibrillation with RVR (HCC) -     dilTIAZem HCl ER Coated Beads; Take 1 capsule (120 mg total) by mouth daily.  Dispense: 90 capsule; Refill: 0 -     EKG 12-Lead  Need for immunization against influenza -     Flu Vaccine Trivalent High Dose (Fluad)     Follow-up: Return in about 3 months (around 05/23/2023).  Sanda Linger, MD

## 2023-02-20 NOTE — Progress Notes (Unsigned)
02/20/2023 Name: Theresa Harrington MRN: [id number] DOB: 1955/09/18   Patient was in office and asked about status of PAP for Synjardy and [employer].  Synjardy app was mailed 10/1 but patient has not received. Application filled out and signed in office today.  She was previously enrolled in PAP for Basaglar through [employer] however she states she has not received any this year. Application filled out and signed in office today.  Faxing both today.  Arbutus Leas, PharmD, BCPS Clinical Pharmacist [facility] [phone]

## 2023-02-20 NOTE — Patient Instructions (Signed)

## 2023-02-23 DIAGNOSIS — I739 Peripheral vascular disease, unspecified: Secondary | ICD-10-CM | POA: Diagnosis not present

## 2023-02-23 DIAGNOSIS — S90422A Blister (nonthermal), left great toe, initial encounter: Secondary | ICD-10-CM | POA: Diagnosis not present

## 2023-02-23 DIAGNOSIS — S90421A Blister (nonthermal), right great toe, initial encounter: Secondary | ICD-10-CM | POA: Diagnosis not present

## 2023-02-23 DIAGNOSIS — E1142 Type 2 diabetes mellitus with diabetic polyneuropathy: Secondary | ICD-10-CM | POA: Diagnosis not present

## 2023-02-23 DIAGNOSIS — S90425A Blister (nonthermal), left lesser toe(s), initial encounter: Secondary | ICD-10-CM | POA: Diagnosis not present

## 2023-02-23 DIAGNOSIS — Z89422 Acquired absence of other left toe(s): Secondary | ICD-10-CM | POA: Diagnosis not present

## 2023-02-23 MED ORDER — BASAGLAR KWIKPEN 100 UNIT/ML ~~LOC~~ SOPN: 60.0000 [IU] | PEN_INJECTOR | Freq: Every day | SUBCUTANEOUS | 1 refills | Status: DC

## 2023-02-25 NOTE — Progress Notes (Unsigned)
Cardiology Office Note:  .   Date:  02/26/2023  ID:  Theresa Harrington, DOB Dec 02, 1955, MRN [id number] PCP: Etta Grandchild, MD  [facility] Providers Cardiologist:  Donato Schultz, MD Electrophysiologist:  Will Jorja Loa, MD {  History of Present Illness: [name]    Theresa Harrington is a 67 y.o. 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  She is followed in the A-fib fib clinic and is status post a flutter ablation in the setting of COVID and personal stress on 08/02/2021.  She remains on Xarelto CHA2DS2-VASc score of 5.  Most recently seen by cardiology on 09/19/2022 in the A-fib clinic.  No medication changes were made and she was to continue to use 30 mg additional dose of diltiazem for breakthrough rapid heart rhythm.  Recently seen by primary care physician, Dr. Sanda Linger on 02/20/2023 was found to be in atrial flutter with 4 1 AV conduction heart rate of 72 bpm.  She is here for follow-up concerning that.  The patient states she often feels her heart rate become irregular and she is taking extra doses of diltiazem 30 mg almost daily now.  She denies shortness of breath, chest pain, or fatigue.  Blood pressures been slightly elevated at home.  Has been on hydralazine 50 mg 3 times daily.  She is only taking it twice a day currently.  She also reports that her hemoglobin A1c was 8.4.  She is having medication adjustments and follow-up by PCP.  ROS: As above otherwise negative.  Studies Reviewed: [name]   EKG Interpretation Date/Time:  Monday February 26 2023 09:02:47 EDT Ventricular Rate:  60 PR Interval:  160 QRS Duration:  86 QT Interval:  472 QTC Calculation: 472 R Axis:   38  Text Interpretation: Normal sinus rhythm Normal ECG When compared with ECG of 19-Sep-2022 09:28, Vent. rate has decreased BY  81 BPM Nonspecific T wave abnormality  no longer evident in Lateral leads Confirmed by Joni Reining [phone]) on 02/26/2023 9:11:24 AM   Echocardiogram 10/07/2020 1. Left ventricular ejection fraction, by estimation, is 65 to 70%. The  left ventricle has normal function. The left ventricle has no regional  wall motion abnormalities. There is mild left ventricular hypertrophy.  Left ventricular diastolic parameters  are consistent with Grade II diastolic dysfunction (pseudonormalization).  Elevated left atrial pressure.   2. Right ventricular systolic function is normal. The right ventricular  size is normal. Tricuspid regurgitation signal is inadequate for assessing  PA pressure.   3. Left atrial size was severely dilated.   4. Right atrial size was mildly dilated.   5. The mitral valve is abnormal. Severe mitral annular calcification. No  evidence of mitral valve regurgitation. Mild to moderate mitral stenosis.  MG , MVA 1.4 cm^2 by continuity equation   6. The aortic valve is tricuspid. Aortic valve regurgitation is not  visualized. Mild to moderate aortic valve sclerosis/calcification is  present, without any evidence of aortic stenosis.   7. The inferior vena cava is normal in size with greater than 50%  respiratory variability, suggesting right atrial pressure of 3 mmHg.     EKG Interpretation Date/Time:  Monday February 26 2023 09:02:47 EDT Ventricular Rate:  60 PR Interval:  160 QRS Duration:  86 QT Interval:  472 QTC Calculation: 472 R Axis:   38  Text Interpretation: Normal sinus rhythm Normal ECG When compared with  ECG of 19-Sep-2022 09:28, Vent. rate has decreased BY  81 BPM Nonspecific T wave abnormality no longer evident in Lateral leads Confirmed by Joni Reining [phone]) on 02/26/2023 9:11:24 AM    Physical Exam:   VS:  BP (!) 146/94   Pulse 60   Ht 5\' 4"  (1.626 m)   Wt 171 lb 12.8 oz (77.9 kg)   LMP  (LMP Unknown)   SpO2 96%   BMI 29.49 kg/m    Wt Readings from Last 3 Encounters:   02/26/23 171 lb 12.8 oz (77.9 kg)  02/20/23 169 lb 9.6 oz (76.9 kg)  09/19/22 169 lb 3.2 oz (76.7 kg)    GEN: Well nourished, well developed in no acute distress NECK: No JVD; No carotid bruits CARDIAC: RRR, 2/6 systolic murmur heard at the left sternal border and right sternal border.  Murmurs, rubs, gallops RESPIRATORY:  Clear to auscultation without rales, wheezing or rhonchi  ABDOMEN: Soft, non-tender, non-distended EXTREMITIES:  No edema; No deformity   ASSESSMENT AND PLAN: .    Paroxysmal A-fib/flutter: She states she is having to take extra doses of 30 mg diltiazem daily.  Currently in normal sinus rhythm.  I will increase her diltiazem to 180 mg daily from 120 mg daily which hopefully will keep her from having to take the extra doses.  She will continue on anticoagulation therapy with Xarelto, CHA2DS2-VASc score 5.  I will see her on follow-up in 1 month to evaluate her response to medication.  She will continue in A-fib clinic for follow-up as well.    2.  Hypertension: Has been elevated.  Was started on hydralazine.  Will watch this closely as we are going up on her diltiazem 280 mg daily.  Okay to take hydralazine twice daily.  3.  Hypercholesterolemia: Remains on rosuvastatin 5 mg daily.  Review of most recent labs 09/13/2022, total cholesterol 211, LDL 39, HDL 48.  4.  Aortic and mitral valve calcifications: No stenosis is noted per echo in 2022.  I will repeat her echocardiogram when I see her on follow-up to evaluate response to medication for ongoing surveillance of these valves.   Signed, Bettey Mare. Liborio Nixon, ANP, AACC

## 2023-02-26 ENCOUNTER — Encounter: Payer: Self-pay | Admitting: Adult Health

## 2023-02-26 ENCOUNTER — Ambulatory Visit: Payer: PPO | Attending: Adult Health | Admitting: Adult Health

## 2023-02-26 VITALS — BP 146/94 | HR 60 | Ht 64.0 in | Wt 171.8 lb

## 2023-02-26 DIAGNOSIS — I4819 Other persistent atrial fibrillation: Secondary | ICD-10-CM | POA: Diagnosis not present

## 2023-02-26 DIAGNOSIS — I484 Atypical atrial flutter: Secondary | ICD-10-CM

## 2023-02-26 DIAGNOSIS — D6869 Other thrombophilia: Secondary | ICD-10-CM

## 2023-02-26 MED ORDER — DILTIAZEM HCL ER COATED BEADS 180 MG PO CP24: 180.0000 mg | ORAL_CAPSULE | Freq: Every day | ORAL | 3 refills | Status: DC

## 2023-02-26 NOTE — Patient Instructions (Signed)
Medication Instructions:  Increase Diltiazem to 180 mg ( Take 1 Capsule Daily). *If you need a refill on your cardiac medications before your next appointment, please call your pharmacy*   Lab Work: No Labs If you have labs (blood work) drawn today and your tests are completely normal, you will receive your results only by: MyChart Message (if you have MyChart) OR A paper copy in the mail If you have any lab test that is abnormal or we need to change your treatment, we will call you to review the results.   Testing/Procedures: No Testing   Follow-Up: At [facility], you and your health needs are our priority.  As part of our continuing mission to provide you with exceptional heart care, we have created designated Provider Care Teams.  These Care Teams include your primary Cardiologist (physician) and Advanced Practice Providers (APPs -  Physician Assistants and Nurse Practitioners) who all work together to provide you with the care you need, when you need it.  We recommend signing up for the patient portal called "MyChart".  Sign up information is provided on this After Visit Summary.  MyChart is used to connect with patients for Virtual Visits (Telemedicine).  Patients are able to view lab/test results, encounter notes, upcoming appointments, etc.  Non-urgent messages can be sent to your provider as well.   To learn more about what you can do with MyChart, go to [email or web address].    Your next appointment:   1 month(s)  Provider:   Joni Reining , DNP, ANP

## 2023-02-27 ENCOUNTER — Telehealth: Payer: Self-pay

## 2023-02-27 ENCOUNTER — Other Ambulatory Visit: Payer: Self-pay | Admitting: *Deleted

## 2023-02-27 ENCOUNTER — Other Ambulatory Visit: Payer: Self-pay

## 2023-02-27 DIAGNOSIS — S90422D Blister (nonthermal), left great toe, subsequent encounter: Secondary | ICD-10-CM | POA: Diagnosis not present

## 2023-02-27 DIAGNOSIS — Z89422 Acquired absence of other left toe(s): Secondary | ICD-10-CM | POA: Diagnosis not present

## 2023-02-27 DIAGNOSIS — S90829A Blister (nonthermal), unspecified foot, initial encounter: Secondary | ICD-10-CM | POA: Insufficient documentation

## 2023-02-27 DIAGNOSIS — S90421D Blister (nonthermal), right great toe, subsequent encounter: Secondary | ICD-10-CM | POA: Diagnosis not present

## 2023-02-27 DIAGNOSIS — I739 Peripheral vascular disease, unspecified: Secondary | ICD-10-CM | POA: Diagnosis not present

## 2023-02-27 DIAGNOSIS — I70213 Atherosclerosis of native arteries of extremities with intermittent claudication, bilateral legs: Secondary | ICD-10-CM

## 2023-02-27 NOTE — Telephone Encounter (Signed)
CallerHerbert Seta, RN @ [facility]  Concern: Podiatry office called stating that patient has been seen on Friday and today with increased swelling, pain and what had started out as blood blisters on her both of her feet and toes. Today she came in and saw Dr. Donnie Aho and he is concerned with the increased deterioration of the wounds and discoloration. He feels her feet are gangrenous and that the blood blisters have opened up at this time and he is concerned for more tissue loss.   Location: Both feet  Description: sudden  Aggravating Factors: movement, walking, patient had shoes also that were causing friction  Quality: shooting and variable intensity  Treatments:  unsure if patient has taken medication, was not reported from podiatry office if she had been  Procedure: left femoral-popliteal bypass graft by Dr. Arbie Cookey on 06/08/2014.   06/09/2014 and we did her bypass graft with Gore-Tex.  endarterectomy of the left anterior tibial artery.   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    Consulted: V.WMyra Gianotti, MD  Resolution: Appointment scheduled w/ PA while MD in office  Next Appt: Appointment scheduled for 02/28/23 with [place] @ 1230pm

## 2023-02-28 ENCOUNTER — Ambulatory Visit (HOSPITAL_COMMUNITY)
Admission: RE | Admit: 2023-02-28 | Discharge: 2023-02-28 | Disposition: A | Discharge status: Home / Self Care | Payer: PPO | Source: Ambulatory Visit | Attending: Vascular Surgery | Admitting: Vascular Surgery

## 2023-02-28 ENCOUNTER — Other Ambulatory Visit: Payer: Self-pay

## 2023-02-28 ENCOUNTER — Ambulatory Visit: Payer: PPO | Admitting: Physician Assistant

## 2023-02-28 VITALS — BP 164/66 | HR 55 | Temp 98.2°F | Resp 18 | Ht 65.0 in | Wt 168.6 lb

## 2023-02-28 DIAGNOSIS — I70213 Atherosclerosis of native arteries of extremities with intermittent claudication, bilateral legs: Secondary | ICD-10-CM | POA: Insufficient documentation

## 2023-02-28 DIAGNOSIS — I7025 Atherosclerosis of native arteries of other extremities with ulceration: Secondary | ICD-10-CM

## 2023-02-28 LAB — VAS US ABI WITH/WO TBI
Left ABI: 0.84
Right ABI: 0.75

## 2023-02-28 MED ORDER — DILTIAZEM HCL 30 MG PO TABS: ORAL_TABLET | ORAL | 1 refills | Status: DC

## 2023-02-28 NOTE — H&P (View-Only) (Signed)
Office Note     CC:  follow up Requesting Provider:  Etta Grandchild, MD  HPI: Theresa Harrington is a 67 y.o. (01/31/1956) female who presents with concerns of new wounds on both of her feet. Her podiatry office called yesterday after having seen patient last Friday for blood blisters on both feet on the great toes. She was again seen yesterday and it appeared that the blistered areas were worsening. She was started on Doxycycline. Today Theresa Harrington explains that she was wearing a new pair of tennis shoes last Thursday, on her birthday, and when she got home to take a shower she noticed some dark areas on her toes. She became immediately concerned so that is why she went to see her Podiatrist. She explains that she has bene having pain that is sharp and sort of comes and goes. Her pain is worse when walking. She does not have a lot of cramping in her legs but will occasionally get some discomfort on ambulation. She does not have any pain at rest. At the time of her last visit with Dr. Myra Gianotti in November of 2023 the sore that was previously on the bottom of her right great toe had healed. She did have a blister on her left great toe as well but this was also healing. For that reason they decided not to pursue a RLE bypass. She has been trying to increase her activity level. She is medically managed on Statin and Xarelto   Past Medical History:  Diagnosis Date   Allergy    Anxiety    Pt. denies   Arthritis    right index figer   Asymptomatic cholelithiasis    Atherosclerosis of aorta (HCC)    Cataract    Clotting disorder (HCC)    Gallstones 06/2013   GERD (gastroesophageal reflux disease)    Glaucoma    "A LITTLE BIT"   History of kidney stones    lithotrispy   Hyperlipidemia    Hypertension    Hypothyroidism    no meds   Iron deficiency anemia    PAD (peripheral artery disease) (HCC)    PAF (paroxysmal atrial fibrillation) (HCC)    Peripheral arterial occlusive disease (HCC)    lower  extremities   Pneumonia    PONV (postoperative nausea and vomiting)    Right ureteral stone    Type 2 diabetes mellitus (HCC)    Type   Vitamin B 12 deficiency    Wears glasses     Past Surgical History:  Procedure Laterality Date   ABDOMINAL AORTOGRAM W/LOWER EXTREMITY N/A 03/09/2020   Procedure: ABDOMINAL AORTOGRAM W/LOWER EXTREMITY;  Surgeon: Nada Libman, MD;  Location: [facility];  Service: Cardiovascular;  Laterality: N/A;   AMPUTATION Left 07/16/2014   Procedure: LEFT SECOND TOE AMPUTATION;  Surgeon: Nada Libman, MD;  Location: [facility];  Service: Vascular;  Laterality: Left;  With Nerve block   ATRIAL FIBRILLATION ABLATION N/A 08/02/2021   Procedure: ATRIAL FIBRILLATION ABLATION;  Surgeon: Regan Lemming, MD;  Location: [facility];  Service: Cardiovascular;  Laterality: N/A;   AUGMENTATION MAMMAPLASTY     BELPHAROPTOSIS REPAIR     eyelid lift   BIOPSY  10/14/2018   Procedure: BIOPSY;  Surgeon: Meridee Score Netty Starring., MD;  Location: [facility] ENDOSCOPY;  Service: Gastroenterology;;   BIOPSY  08/04/2019   Procedure: BIOPSY;  Surgeon: Lemar Lofty., MD;  Location: [facility] ENDOSCOPY;  Service: Gastroenterology;;   CARDIOVERSION N/A 09/19/2019   Procedure:  Office Note     CC:  follow up Requesting Provider:  Etta Grandchild, MD  HPI: Theresa Harrington is a 67 y.o. (01/31/1956) female who presents with concerns of new wounds on both of her feet. Her podiatry office called yesterday after having seen patient last Friday for blood blisters on both feet on the great toes. She was again seen yesterday and it appeared that the blistered areas were worsening. She was started on Doxycycline. Today Theresa Harrington explains that she was wearing a new pair of tennis shoes last Thursday, on her birthday, and when she got home to take a shower she noticed some dark areas on her toes. She became immediately concerned so that is why she went to see her Podiatrist. She explains that she has bene having pain that is sharp and sort of comes and goes. Her pain is worse when walking. She does not have a lot of cramping in her legs but will occasionally get some discomfort on ambulation. She does not have any pain at rest. At the time of her last visit with Dr. Myra Gianotti in November of 2023 the sore that was previously on the bottom of her right great toe had healed. She did have a blister on her left great toe as well but this was also healing. For that reason they decided not to pursue a RLE bypass. She has been trying to increase her activity level. She is medically managed on Statin and Xarelto   Past Medical History:  Diagnosis Date   Allergy    Anxiety    Pt. denies   Arthritis    right index figer   Asymptomatic cholelithiasis    Atherosclerosis of aorta (HCC)    Cataract    Clotting disorder (HCC)    Gallstones 06/2013   GERD (gastroesophageal reflux disease)    Glaucoma    "A LITTLE BIT"   History of kidney stones    lithotrispy   Hyperlipidemia    Hypertension    Hypothyroidism    no meds   Iron deficiency anemia    PAD (peripheral artery disease) (HCC)    PAF (paroxysmal atrial fibrillation) (HCC)    Peripheral arterial occlusive disease (HCC)    lower  extremities   Pneumonia    PONV (postoperative nausea and vomiting)    Right ureteral stone    Type 2 diabetes mellitus (HCC)    Type   Vitamin B 12 deficiency    Wears glasses     Past Surgical History:  Procedure Laterality Date   ABDOMINAL AORTOGRAM W/LOWER EXTREMITY N/A 03/09/2020   Procedure: ABDOMINAL AORTOGRAM W/LOWER EXTREMITY;  Surgeon: Nada Libman, MD;  Location: [facility];  Service: Cardiovascular;  Laterality: N/A;   AMPUTATION Left 07/16/2014   Procedure: LEFT SECOND TOE AMPUTATION;  Surgeon: Nada Libman, MD;  Location: [facility];  Service: Vascular;  Laterality: Left;  With Nerve block   ATRIAL FIBRILLATION ABLATION N/A 08/02/2021   Procedure: ATRIAL FIBRILLATION ABLATION;  Surgeon: Regan Lemming, MD;  Location: [facility];  Service: Cardiovascular;  Laterality: N/A;   AUGMENTATION MAMMAPLASTY     BELPHAROPTOSIS REPAIR     eyelid lift   BIOPSY  10/14/2018   Procedure: BIOPSY;  Surgeon: Meridee Score Netty Starring., MD;  Location: [facility] ENDOSCOPY;  Service: Gastroenterology;;   BIOPSY  08/04/2019   Procedure: BIOPSY;  Surgeon: Lemar Lofty., MD;  Location: [facility] ENDOSCOPY;  Service: Gastroenterology;;   CARDIOVERSION N/A 09/19/2019   Procedure:  Office Note     CC:  follow up Requesting Provider:  Etta Grandchild, MD  HPI: Theresa Harrington is a 67 y.o. (01/31/1956) female who presents with concerns of new wounds on both of her feet. Her podiatry office called yesterday after having seen patient last Friday for blood blisters on both feet on the great toes. She was again seen yesterday and it appeared that the blistered areas were worsening. She was started on Doxycycline. Today Theresa Harrington explains that she was wearing a new pair of tennis shoes last Thursday, on her birthday, and when she got home to take a shower she noticed some dark areas on her toes. She became immediately concerned so that is why she went to see her Podiatrist. She explains that she has bene having pain that is sharp and sort of comes and goes. Her pain is worse when walking. She does not have a lot of cramping in her legs but will occasionally get some discomfort on ambulation. She does not have any pain at rest. At the time of her last visit with Dr. Myra Gianotti in November of 2023 the sore that was previously on the bottom of her right great toe had healed. She did have a blister on her left great toe as well but this was also healing. For that reason they decided not to pursue a RLE bypass. She has been trying to increase her activity level. She is medically managed on Statin and Xarelto   Past Medical History:  Diagnosis Date   Allergy    Anxiety    Pt. denies   Arthritis    right index figer   Asymptomatic cholelithiasis    Atherosclerosis of aorta (HCC)    Cataract    Clotting disorder (HCC)    Gallstones 06/2013   GERD (gastroesophageal reflux disease)    Glaucoma    "A LITTLE BIT"   History of kidney stones    lithotrispy   Hyperlipidemia    Hypertension    Hypothyroidism    no meds   Iron deficiency anemia    PAD (peripheral artery disease) (HCC)    PAF (paroxysmal atrial fibrillation) (HCC)    Peripheral arterial occlusive disease (HCC)    lower  extremities   Pneumonia    PONV (postoperative nausea and vomiting)    Right ureteral stone    Type 2 diabetes mellitus (HCC)    Type   Vitamin B 12 deficiency    Wears glasses     Past Surgical History:  Procedure Laterality Date   ABDOMINAL AORTOGRAM W/LOWER EXTREMITY N/A 03/09/2020   Procedure: ABDOMINAL AORTOGRAM W/LOWER EXTREMITY;  Surgeon: Nada Libman, MD;  Location: [facility];  Service: Cardiovascular;  Laterality: N/A;   AMPUTATION Left 07/16/2014   Procedure: LEFT SECOND TOE AMPUTATION;  Surgeon: Nada Libman, MD;  Location: [facility];  Service: Vascular;  Laterality: Left;  With Nerve block   ATRIAL FIBRILLATION ABLATION N/A 08/02/2021   Procedure: ATRIAL FIBRILLATION ABLATION;  Surgeon: Regan Lemming, MD;  Location: [facility];  Service: Cardiovascular;  Laterality: N/A;   AUGMENTATION MAMMAPLASTY     BELPHAROPTOSIS REPAIR     eyelid lift   BIOPSY  10/14/2018   Procedure: BIOPSY;  Surgeon: Meridee Score Netty Starring., MD;  Location: [facility] ENDOSCOPY;  Service: Gastroenterology;;   BIOPSY  08/04/2019   Procedure: BIOPSY;  Surgeon: Lemar Lofty., MD;  Location: [facility] ENDOSCOPY;  Service: Gastroenterology;;   CARDIOVERSION N/A 09/19/2019   Procedure:  Office Note     CC:  follow up Requesting Provider:  Etta Grandchild, MD  HPI: Theresa Harrington is a 67 y.o. (01/31/1956) female who presents with concerns of new wounds on both of her feet. Her podiatry office called yesterday after having seen patient last Friday for blood blisters on both feet on the great toes. She was again seen yesterday and it appeared that the blistered areas were worsening. She was started on Doxycycline. Today Theresa Harrington explains that she was wearing a new pair of tennis shoes last Thursday, on her birthday, and when she got home to take a shower she noticed some dark areas on her toes. She became immediately concerned so that is why she went to see her Podiatrist. She explains that she has bene having pain that is sharp and sort of comes and goes. Her pain is worse when walking. She does not have a lot of cramping in her legs but will occasionally get some discomfort on ambulation. She does not have any pain at rest. At the time of her last visit with Dr. Myra Gianotti in November of 2023 the sore that was previously on the bottom of her right great toe had healed. She did have a blister on her left great toe as well but this was also healing. For that reason they decided not to pursue a RLE bypass. She has been trying to increase her activity level. She is medically managed on Statin and Xarelto   Past Medical History:  Diagnosis Date   Allergy    Anxiety    Pt. denies   Arthritis    right index figer   Asymptomatic cholelithiasis    Atherosclerosis of aorta (HCC)    Cataract    Clotting disorder (HCC)    Gallstones 06/2013   GERD (gastroesophageal reflux disease)    Glaucoma    "A LITTLE BIT"   History of kidney stones    lithotrispy   Hyperlipidemia    Hypertension    Hypothyroidism    no meds   Iron deficiency anemia    PAD (peripheral artery disease) (HCC)    PAF (paroxysmal atrial fibrillation) (HCC)    Peripheral arterial occlusive disease (HCC)    lower  extremities   Pneumonia    PONV (postoperative nausea and vomiting)    Right ureteral stone    Type 2 diabetes mellitus (HCC)    Type   Vitamin B 12 deficiency    Wears glasses     Past Surgical History:  Procedure Laterality Date   ABDOMINAL AORTOGRAM W/LOWER EXTREMITY N/A 03/09/2020   Procedure: ABDOMINAL AORTOGRAM W/LOWER EXTREMITY;  Surgeon: Nada Libman, MD;  Location: [facility];  Service: Cardiovascular;  Laterality: N/A;   AMPUTATION Left 07/16/2014   Procedure: LEFT SECOND TOE AMPUTATION;  Surgeon: Nada Libman, MD;  Location: [facility];  Service: Vascular;  Laterality: Left;  With Nerve block   ATRIAL FIBRILLATION ABLATION N/A 08/02/2021   Procedure: ATRIAL FIBRILLATION ABLATION;  Surgeon: Regan Lemming, MD;  Location: [facility];  Service: Cardiovascular;  Laterality: N/A;   AUGMENTATION MAMMAPLASTY     BELPHAROPTOSIS REPAIR     eyelid lift   BIOPSY  10/14/2018   Procedure: BIOPSY;  Surgeon: Meridee Score Netty Starring., MD;  Location: [facility] ENDOSCOPY;  Service: Gastroenterology;;   BIOPSY  08/04/2019   Procedure: BIOPSY;  Surgeon: Lemar Lofty., MD;  Location: [facility] ENDOSCOPY;  Service: Gastroenterology;;   CARDIOVERSION N/A 09/19/2019   Procedure:  Office Note     CC:  follow up Requesting Provider:  Etta Grandchild, MD  HPI: Theresa Harrington is a 67 y.o. (01/31/1956) female who presents with concerns of new wounds on both of her feet. Her podiatry office called yesterday after having seen patient last Friday for blood blisters on both feet on the great toes. She was again seen yesterday and it appeared that the blistered areas were worsening. She was started on Doxycycline. Today Theresa Harrington explains that she was wearing a new pair of tennis shoes last Thursday, on her birthday, and when she got home to take a shower she noticed some dark areas on her toes. She became immediately concerned so that is why she went to see her Podiatrist. She explains that she has bene having pain that is sharp and sort of comes and goes. Her pain is worse when walking. She does not have a lot of cramping in her legs but will occasionally get some discomfort on ambulation. She does not have any pain at rest. At the time of her last visit with Dr. Myra Gianotti in November of 2023 the sore that was previously on the bottom of her right great toe had healed. She did have a blister on her left great toe as well but this was also healing. For that reason they decided not to pursue a RLE bypass. She has been trying to increase her activity level. She is medically managed on Statin and Xarelto   Past Medical History:  Diagnosis Date   Allergy    Anxiety    Pt. denies   Arthritis    right index figer   Asymptomatic cholelithiasis    Atherosclerosis of aorta (HCC)    Cataract    Clotting disorder (HCC)    Gallstones 06/2013   GERD (gastroesophageal reflux disease)    Glaucoma    "A LITTLE BIT"   History of kidney stones    lithotrispy   Hyperlipidemia    Hypertension    Hypothyroidism    no meds   Iron deficiency anemia    PAD (peripheral artery disease) (HCC)    PAF (paroxysmal atrial fibrillation) (HCC)    Peripheral arterial occlusive disease (HCC)    lower  extremities   Pneumonia    PONV (postoperative nausea and vomiting)    Right ureteral stone    Type 2 diabetes mellitus (HCC)    Type   Vitamin B 12 deficiency    Wears glasses     Past Surgical History:  Procedure Laterality Date   ABDOMINAL AORTOGRAM W/LOWER EXTREMITY N/A 03/09/2020   Procedure: ABDOMINAL AORTOGRAM W/LOWER EXTREMITY;  Surgeon: Nada Libman, MD;  Location: [facility];  Service: Cardiovascular;  Laterality: N/A;   AMPUTATION Left 07/16/2014   Procedure: LEFT SECOND TOE AMPUTATION;  Surgeon: Nada Libman, MD;  Location: [facility];  Service: Vascular;  Laterality: Left;  With Nerve block   ATRIAL FIBRILLATION ABLATION N/A 08/02/2021   Procedure: ATRIAL FIBRILLATION ABLATION;  Surgeon: Regan Lemming, MD;  Location: [facility];  Service: Cardiovascular;  Laterality: N/A;   AUGMENTATION MAMMAPLASTY     BELPHAROPTOSIS REPAIR     eyelid lift   BIOPSY  10/14/2018   Procedure: BIOPSY;  Surgeon: Meridee Score Netty Starring., MD;  Location: [facility] END[facility];  Service: Gastroenterology;;   BIOPSY  08/04/2019   Procedure: BIOPSY;  Surgeon: Lemar Lofty., MD;  Location: [facility] ENDOSCOPY;  Service: Gastroenterology;;   CARDIOVERSION N/A 09/19/2019   Procedure:  Office Note     CC:  follow up Requesting Provider:  Etta Grandchild, MD  HPI: Theresa Harrington is a 67 y.o. (01/31/1956) female who presents with concerns of new wounds on both of her feet. Her podiatry office called yesterday after having seen patient last Friday for blood blisters on both feet on the great toes. She was again seen yesterday and it appeared that the blistered areas were worsening. She was started on Doxycycline. Today Theresa Harrington explains that she was wearing a new pair of tennis shoes last Thursday, on her birthday, and when she got home to take a shower she noticed some dark areas on her toes. She became immediately concerned so that is why she went to see her Podiatrist. She explains that she has bene having pain that is sharp and sort of comes and goes. Her pain is worse when walking. She does not have a lot of cramping in her legs but will occasionally get some discomfort on ambulation. She does not have any pain at rest. At the time of her last visit with Dr. Myra Gianotti in November of 2023 the sore that was previously on the bottom of her right great toe had healed. She did have a blister on her left great toe as well but this was also healing. For that reason they decided not to pursue a RLE bypass. She has been trying to increase her activity level. She is medically managed on Statin and Xarelto   Past Medical History:  Diagnosis Date   Allergy    Anxiety    Pt. denies   Arthritis    right index figer   Asymptomatic cholelithiasis    Atherosclerosis of aorta (HCC)    Cataract    Clotting disorder (HCC)    Gallstones 06/2013   GERD (gastroesophageal reflux disease)    Glaucoma    "A LITTLE BIT"   History of kidney stones    lithotrispy   Hyperlipidemia    Hypertension    Hypothyroidism    no meds   Iron deficiency anemia    PAD (peripheral artery disease) (HCC)    PAF (paroxysmal atrial fibrillation) (HCC)    Peripheral arterial occlusive disease (HCC)    lower  extremities   Pneumonia    PONV (postoperative nausea and vomiting)    Right ureteral stone    Type 2 diabetes mellitus (HCC)    Type   Vitamin B 12 deficiency    Wears glasses     Past Surgical History:  Procedure Laterality Date   ABDOMINAL AORTOGRAM W/LOWER EXTREMITY N/A 03/09/2020   Procedure: ABDOMINAL AORTOGRAM W/LOWER EXTREMITY;  Surgeon: Nada Libman, MD;  Location: [facility];  Service: Cardiovascular;  Laterality: N/A;   AMPUTATION Left 07/16/2014   Procedure: LEFT SECOND TOE AMPUTATION;  Surgeon: Nada Libman, MD;  Location: [facility];  Service: Vascular;  Laterality: Left;  With Nerve block   ATRIAL FIBRILLATION ABLATION N/A 08/02/2021   Procedure: ATRIAL FIBRILLATION ABLATION;  Surgeon: Regan Lemming, MD;  Location: [facility];  Service: Cardiovascular;  Laterality: N/A;   AUGMENTATION MAMMAPLASTY     BELPHAROPTOSIS REPAIR     eyelid lift   BIOPSY  10/14/2018   Procedure: BIOPSY;  Surgeon: Meridee Score Netty Starring., MD;  Location: [facility] ENDOSCOPY;  Service: Gastroenterology;;   BIOPSY  08/04/2019   Procedure: BIOPSY;  Surgeon: Lemar Lofty., MD;  Location: [facility] ENDOSCOPY;  Service: Gastroenterology;;   CARDIOVERSION N/A 09/19/2019   Procedure:

## 2023-02-28 NOTE — Progress Notes (Signed)
Office Note     CC:  follow up Requesting Provider:  Etta Grandchild, MD  HPI: Theresa Harrington is a 67 y.o. (01/31/1956) female who presents with concerns of new wounds on both of her feet. Her podiatry office called yesterday after having seen patient last Friday for blood blisters on both feet on the great toes. She was again seen yesterday and it appeared that the blistered areas were worsening. She was started on Doxycycline. Today Theresa Harrington explains that she was wearing a new pair of tennis shoes last Thursday, on her birthday, and when she got home to take a shower she noticed some dark areas on her toes. She became immediately concerned so that is why she went to see her Podiatrist. She explains that she has bene having pain that is sharp and sort of comes and goes. Her pain is worse when walking. She does not have a lot of cramping in her legs but will occasionally get some discomfort on ambulation. She does not have any pain at rest. At the time of her last visit with Dr. Myra Gianotti in November of 2023 the sore that was previously on the bottom of her right great toe had healed. She did have a blister on her left great toe as well but this was also healing. For that reason they decided not to pursue a RLE bypass. She has been trying to increase her activity level. She is medically managed on Statin and Xarelto   Past Medical History:  Diagnosis Date   Allergy    Anxiety    Pt. denies   Arthritis    right index figer   Asymptomatic cholelithiasis    Atherosclerosis of aorta (HCC)    Cataract    Clotting disorder (HCC)    Gallstones 06/2013   GERD (gastroesophageal reflux disease)    Glaucoma    "A LITTLE BIT"   History of kidney stones    lithotrispy   Hyperlipidemia    Hypertension    Hypothyroidism    no meds   Iron deficiency anemia    PAD (peripheral artery disease) (HCC)    PAF (paroxysmal atrial fibrillation) (HCC)    Peripheral arterial occlusive disease (HCC)    lower  extremities   Pneumonia    PONV (postoperative nausea and vomiting)    Right ureteral stone    Type 2 diabetes mellitus (HCC)    Type   Vitamin B 12 deficiency    Wears glasses     Past Surgical History:  Procedure Laterality Date   ABDOMINAL AORTOGRAM W/LOWER EXTREMITY N/A 03/09/2020   Procedure: ABDOMINAL AORTOGRAM W/LOWER EXTREMITY;  Surgeon: Nada Libman, MD;  Location: [facility];  Service: Cardiovascular;  Laterality: N/A;   AMPUTATION Left 07/16/2014   Procedure: LEFT SECOND TOE AMPUTATION;  Surgeon: Nada Libman, MD;  Location: [facility];  Service: Vascular;  Laterality: Left;  With Nerve block   ATRIAL FIBRILLATION ABLATION N/A 08/02/2021   Procedure: ATRIAL FIBRILLATION ABLATION;  Surgeon: Regan Lemming, MD;  Location: [facility];  Service: Cardiovascular;  Laterality: N/A;   AUGMENTATION MAMMAPLASTY     BELPHAROPTOSIS REPAIR     eyelid lift   BIOPSY  10/14/2018   Procedure: BIOPSY;  Surgeon: Meridee Score Netty Starring., MD;  Location: [facility] ENDOSCOPY;  Service: Gastroenterology;;   BIOPSY  08/04/2019   Procedure: BIOPSY;  Surgeon: Lemar Lofty., MD;  Location: [facility] ENDOSCOPY;  Service: Gastroenterology;;   CARDIOVERSION N/A 09/19/2019   Procedure:  Office Note     CC:  follow up Requesting Provider:  Etta Grandchild, MD  HPI: Theresa Harrington is a 67 y.o. (01/31/1956) female who presents with concerns of new wounds on both of her feet. Her podiatry office called yesterday after having seen patient last Friday for blood blisters on both feet on the great toes. She was again seen yesterday and it appeared that the blistered areas were worsening. She was started on Doxycycline. Today Theresa Harrington explains that she was wearing a new pair of tennis shoes last Thursday, on her birthday, and when she got home to take a shower she noticed some dark areas on her toes. She became immediately concerned so that is why she went to see her Podiatrist. She explains that she has bene having pain that is sharp and sort of comes and goes. Her pain is worse when walking. She does not have a lot of cramping in her legs but will occasionally get some discomfort on ambulation. She does not have any pain at rest. At the time of her last visit with Dr. Myra Gianotti in November of 2023 the sore that was previously on the bottom of her right great toe had healed. She did have a blister on her left great toe as well but this was also healing. For that reason they decided not to pursue a RLE bypass. She has been trying to increase her activity level. She is medically managed on Statin and Xarelto   Past Medical History:  Diagnosis Date   Allergy    Anxiety    Pt. denies   Arthritis    right index figer   Asymptomatic cholelithiasis    Atherosclerosis of aorta (HCC)    Cataract    Clotting disorder (HCC)    Gallstones 06/2013   GERD (gastroesophageal reflux disease)    Glaucoma    "A LITTLE BIT"   History of kidney stones    lithotrispy   Hyperlipidemia    Hypertension    Hypothyroidism    no meds   Iron deficiency anemia    PAD (peripheral artery disease) (HCC)    PAF (paroxysmal atrial fibrillation) (HCC)    Peripheral arterial occlusive disease (HCC)    lower  extremities   Pneumonia    PONV (postoperative nausea and vomiting)    Right ureteral stone    Type 2 diabetes mellitus (HCC)    Type   Vitamin B 12 deficiency    Wears glasses     Past Surgical History:  Procedure Laterality Date   ABDOMINAL AORTOGRAM W/LOWER EXTREMITY N/A 03/09/2020   Procedure: ABDOMINAL AORTOGRAM W/LOWER EXTREMITY;  Surgeon: Nada Libman, MD;  Location: [facility];  Service: Cardiovascular;  Laterality: N/A;   AMPUTATION Left 07/16/2014   Procedure: LEFT SECOND TOE AMPUTATION;  Surgeon: Nada Libman, MD;  Location: [facility];  Service: Vascular;  Laterality: Left;  With Nerve block   ATRIAL FIBRILLATION ABLATION N/A 08/02/2021   Procedure: ATRIAL FIBRILLATION ABLATION;  Surgeon: Regan Lemming, MD;  [facility]: [facility];  Service: Cardiovascular;  Laterality: N/A;   AUGMENTATION MAMMAPLASTY     BELPHAROPTOSIS REPAIR     eyelid lift   BIOPSY  10/14/2018   Procedure: BIOPSY;  Surgeon: Meridee Score Netty Starring., MD;  Location: [facility] ENDOSCOPY;  Service: Gastroenterology;;   BIOPSY  08/04/2019   Procedure: BIOPSY;  Surgeon: Lemar Lofty., MD;  Location: [facility] ENDOSCOPY;  Service: Gastroenterology;;   CARDIOVERSION N/A 09/19/2019   Procedure:  Office Note     CC:  follow up Requesting Provider:  Etta Grandchild, MD  HPI: Theresa Harrington is a 67 y.o. (01/31/1956) female who presents with concerns of new wounds on both of her feet. Her podiatry office called yesterday after having seen patient last Friday for blood blisters on both feet on the great toes. She was again seen yesterday and it appeared that the blistered areas were worsening. She was started on Doxycycline. Today Theresa Harrington explains that she was wearing a new pair of tennis shoes last Thursday, on her birthday, and when she got home to take a shower she noticed some dark areas on her toes. She became immediately concerned so that is why she went to see her Podiatrist. She explains that she has bene having pain that is sharp and sort of comes and goes. Her pain is worse when walking. She does not have a lot of cramping in her legs but will occasionally get some discomfort on ambulation. She does not have any pain at rest. At the time of her last visit with Dr. Myra Gianotti in November of 2023 the sore that was previously on the bottom of her right great toe had healed. She did have a blister on her left great toe as well but this was also healing. For that reason they decided not to pursue a RLE bypass. She has been trying to increase her activity level. She is medically managed on Statin and Xarelto   Past Medical History:  Diagnosis Date   Allergy    Anxiety    Pt. denies   Arthritis    right index figer   Asymptomatic cholelithiasis    Atherosclerosis of aorta (HCC)    Cataract    Clotting disorder (HCC)    Gallstones 06/2013   GERD (gastroesophageal reflux disease)    Glaucoma    "A LITTLE BIT"   History of kidney stones    lithotrispy   Hyperlipidemia    Hypertension    Hypothyroidism    no meds   Iron deficiency anemia    PAD (peripheral artery disease) (HCC)    PAF (paroxysmal atrial fibrillation) (HCC)    Peripheral arterial occlusive disease (HCC)    lower  extremities   Pneumonia    PONV (postoperative nausea and vomiting)    Right ureteral stone    Type 2 diabetes mellitus (HCC)    Type   Vitamin B 12 deficiency    Wears glasses     Past Surgical History:  Procedure Laterality Date   ABDOMINAL AORTOGRAM W/LOWER EXTREMITY N/A 03/09/2020   Procedure: ABDOMINAL AORTOGRAM W/LOWER EXTREMITY;  Surgeon: Nada Libman, MD;  [facility]: [facility];  Service: Cardiovascular;  Laterality: N/A;   AMPUTATION Left 07/16/2014   Procedure: LEFT SECOND TOE AMPUTATION;  Surgeon: Nada Libman, MD;  Location: [facility];  Service: Vascular;  Laterality: Left;  With Nerve block   ATRIAL FIBRILLATION ABLATION N/A 08/02/2021   Procedure: ATRIAL FIBRILLATION ABLATION;  Surgeon: Regan Lemming, MD;  [facility]: [facility];  Service: Cardiovascular;  Laterality: N/A;   AUGMENTATION MAMMAPLASTY     BELPHAROPTOSIS REPAIR     eyelid lift   BIOPSY  10/14/2018   Procedure: BIOPSY;  Surgeon: Meridee Score Netty Starring., MD;  Location: [facility] ENDOSCOPY;  Service: Gastroenterology;;   BIOPSY  08/04/2019   Procedure: BIOPSY;  Surgeon: Lemar Lofty., MD;  Location: [facility] ENDOSCOPY;  Service: Gastroenterology;;   CARDIOVERSION N/A 09/19/2019   Procedure:  Office Note     CC:  follow up Requesting Provider:  Etta Grandchild, MD  HPI: Theresa Harrington is a 67 y.o. (01/31/1956) female who presents with concerns of new wounds on both of her feet. Her podiatry office called yesterday after having seen patient last Friday for blood blisters on both feet on the great toes. She was again seen yesterday and it appeared that the blistered areas were worsening. She was started on Doxycycline. Today Theresa Harrington explains that she was wearing a new pair of tennis shoes last Thursday, on her birthday, and when she got home to take a shower she noticed some dark areas on her toes. She became immediately concerned so that is why she went to see her Podiatrist. She explains that she has bene having pain that is sharp and sort of comes and goes. Her pain is worse when walking. She does not have a lot of cramping in her legs but will occasionally get some discomfort on ambulation. She does not have any pain at rest. At the time of her last visit with Dr. Myra Gianotti in November of 2023 the sore that was previously on the bottom of her right great toe had healed. She did have a blister on her left great toe as well but this was also healing. For that reason they decided not to pursue a RLE bypass. She has been trying to increase her activity level. She is medically managed on Statin and Xarelto   Past Medical History:  Diagnosis Date   Allergy    Anxiety    Pt. denies   Arthritis    right index figer   Asymptomatic cholelithiasis    Atherosclerosis of aorta (HCC)    Cataract    Clotting disorder (HCC)    Gallstones 06/2013   GERD (gastroesophageal reflux disease)    Glaucoma    "A LITTLE BIT"   History of kidney stones    lithotrispy   Hyperlipidemia    Hypertension    Hypothyroidism    no meds   Iron deficiency anemia    PAD (peripheral artery disease) (HCC)    PAF (paroxysmal atrial fibrillation) (HCC)    Peripheral arterial occlusive disease (HCC)    lower  extremities   Pneumonia    PONV (postoperative nausea and vomiting)    Right ureteral stone    Type 2 diabetes mellitus (HCC)    Type   Vitamin B 12 deficiency    Wears glasses     Past Surgical History:  Procedure Laterality Date   ABDOMINAL AORTOGRAM W/LOWER EXTREMITY N/A 03/09/2020   Procedure: ABDOMINAL AORTOGRAM W/LOWER EXTREMITY;  Surgeon: Nada Libman, MD;  Location: [facility];  Service: Cardiovascular;  Laterality: N/A;   AMPUTATION Left 07/16/2014   Procedure: LEFT SECOND TOE AMPUTATION;  Surgeon: Nada Libman, MD;  Location: [facility];  Service: Vascular;  Laterality: Left;  With Nerve block   ATRIAL FIBRILLATION ABLATION N/A 08/02/2021   Procedure: ATRIAL FIBRILLATION ABLATION;  Surgeon: Regan Lemming, MD;  [facility]: [facility];  Service: Cardiovascular;  Laterality: N/A;   AUGMENTATION MAMMAPLASTY     BELPHAROPTOSIS REPAIR     eyelid lift   BIOPSY  10/14/2018   Procedure: BIOPSY;  Surgeon: Meridee Score Netty Starring., MD;  Location: [facility] ENDOSCOPY;  Service: Gastroenterology;;   BIOPSY  08/04/2019   Procedure: BIOPSY;  Surgeon: Lemar Lofty., MD;  Location: [facility] ENDOSCOPY;  Service: Gastroenterology;;   CARDIOVERSION N/A 09/19/2019   Procedure:  Office Note     CC:  follow up Requesting Provider:  Etta Grandchild, MD  HPI: Theresa Harrington is a 67 y.o. (01/31/1956) female who presents with concerns of new wounds on both of her feet. Her podiatry office called yesterday after having seen patient last Friday for blood blisters on both feet on the great toes. She was again seen yesterday and it appeared that the blistered areas were worsening. She was started on Doxycycline. Today Theresa Harrington explains that she was wearing a new pair of tennis shoes last Thursday, on her birthday, and when she got home to take a shower she noticed some dark areas on her toes. She became immediately concerned so that is why she went to see her Podiatrist. She explains that she has bene having pain that is sharp and sort of comes and goes. Her pain is worse when walking. She does not have a lot of cramping in her legs but will occasionally get some discomfort on ambulation. She does not have any pain at rest. At the time of her last visit with Dr. Myra Gianotti in November of 2023 the sore that was previously on the bottom of her right great toe had healed. She did have a blister on her left great toe as well but this was also healing. For that reason they decided not to pursue a RLE bypass. She has been trying to increase her activity level. She is medically managed on Statin and Xarelto   Past Medical History:  Diagnosis Date   Allergy    Anxiety    Pt. denies   Arthritis    right index figer   Asymptomatic cholelithiasis    Atherosclerosis of aorta (HCC)    Cataract    Clotting disorder (HCC)    Gallstones 06/2013   GERD (gastroesophageal reflux disease)    Glaucoma    "A LITTLE BIT"   History of kidney stones    lithotrispy   Hyperlipidemia    Hypertension    Hypothyroidism    no meds   Iron deficiency anemia    PAD (peripheral artery disease) (HCC)    PAF (paroxysmal atrial fibrillation) (HCC)    Peripheral arterial occlusive disease (HCC)    lower  extremities   Pneumonia    PONV (postoperative nausea and vomiting)    Right ureteral stone    Type 2 diabetes mellitus (HCC)    Type   Vitamin B 12 deficiency    Wears glasses     Past Surgical History:  Procedure Laterality Date   ABDOMINAL AORTOGRAM W/LOWER EXTREMITY N/A 03/09/2020   Procedure: ABDOMINAL AORTOGRAM W/LOWER EXTREMITY;  Surgeon: Nada Libman, MD;  Location: [facility];  Service: Cardiovascular;  Laterality: N/A;   AMPUTATION Left 07/16/2014   Procedure: LEFT SECOND TOE AMPUTATION;  Surgeon: Nada Libman, MD;  Location: [facility];  Service: Vascular;  Laterality: Left;  With Nerve block   ATRIAL FIBRILLATION ABLATION N/A 08/02/2021   Procedure: ATRIAL FIBRILLATION ABLATION;  Surgeon: Regan Lemming, MD;  [facility]: [facility];  Service: Cardiovascular;  Laterality: N/A;   AUGMENTATION MAMMAPLASTY     BELPHAROPTOSIS REPAIR     eyelid lift   BIOPSY  10/14/2018   Procedure: BIOPSY;  Surgeon: Meridee Score Netty Starring., MD;  Location: [facility] END[facility];  Service: Gastroenterology;;   BIOPSY  08/04/2019   Procedure: BIOPSY;  Surgeon: Lemar Lofty., MD;  Location: [facility] ENDOSCOPY;  Service: Gastroenterology;;   CARDIOVERSION N/A 09/19/2019   Procedure:  Office Note     CC:  follow up Requesting Provider:  Etta Grandchild, MD  HPI: Theresa Harrington is a 67 y.o. (01/31/1956) female who presents with concerns of new wounds on both of her feet. Her podiatry office called yesterday after having seen patient last Friday for blood blisters on both feet on the great toes. She was again seen yesterday and it appeared that the blistered areas were worsening. She was started on Doxycycline. Today Theresa Harrington explains that she was wearing a new pair of tennis shoes last Thursday, on her birthday, and when she got home to take a shower she noticed some dark areas on her toes. She became immediately concerned so that is why she went to see her Podiatrist. She explains that she has bene having pain that is sharp and sort of comes and goes. Her pain is worse when walking. She does not have a lot of cramping in her legs but will occasionally get some discomfort on ambulation. She does not have any pain at rest. At the time of her last visit with Dr. Myra Gianotti in November of 2023 the sore that was previously on the bottom of her right great toe had healed. She did have a blister on her left great toe as well but this was also healing. For that reason they decided not to pursue a RLE bypass. She has been trying to increase her activity level. She is medically managed on Statin and Xarelto   Past Medical History:  Diagnosis Date   Allergy    Anxiety    Pt. denies   Arthritis    right index figer   Asymptomatic cholelithiasis    Atherosclerosis of aorta (HCC)    Cataract    Clotting disorder (HCC)    Gallstones 06/2013   GERD (gastroesophageal reflux disease)    Glaucoma    "A LITTLE BIT"   History of kidney stones    lithotrispy   Hyperlipidemia    Hypertension    Hypothyroidism    no meds   Iron deficiency anemia    PAD (peripheral artery disease) (HCC)    PAF (paroxysmal atrial fibrillation) (HCC)    Peripheral arterial occlusive disease (HCC)    lower  extremities   Pneumonia    PONV (postoperative nausea and vomiting)    Right ureteral stone    Type 2 diabetes mellitus (HCC)    Type   Vitamin B 12 deficiency    Wears glasses     Past Surgical History:  Procedure Laterality Date   ABDOMINAL AORTOGRAM W/LOWER EXTREMITY N/A 03/09/2020   Procedure: ABDOMINAL AORTOGRAM W/LOWER EXTREMITY;  Surgeon: Nada Libman, MD;  [facility]: [facility];  Service: Cardiovascular;  Laterality: N/A;   AMPUTATION Left 07/16/2014   Procedure: LEFT SECOND TOE AMPUTATION;  Surgeon: Nada Libman, MD;  Location: [facility];  Service: Vascular;  Laterality: Left;  With Nerve block   ATRIAL FIBRILLATION ABLATION N/A 08/02/2021   Procedure: ATRIAL FIBRILLATION ABLATION;  Surgeon: Regan Lemming, MD;  [facility]: [facility];  Service: Cardiovascular;  Laterality: N/A;   AUGMENTATION MAMMAPLASTY     BELPHAROPTOSIS REPAIR     eyelid lift   BIOPSY  10/14/2018   Procedure: BIOPSY;  Surgeon: Meridee Score Netty Starring., MD;  Location: [facility] ENDOSCOPY;  Service: Gastroenterology;;   BIOPSY  08/04/2019   Procedure: BIOPSY;  Surgeon: Lemar Lofty., MD;  Location: [facility] ENDOSCOPY;  Service: Gastroenterology;;   CARDIOVERSION N/A 09/19/2019   Procedure:

## 2023-03-05 ENCOUNTER — Encounter: Payer: Self-pay | Admitting: Pharmacist

## 2023-03-05 NOTE — Progress Notes (Signed)
Patient assistance applications for Theresa Harrington and Theresa Harrington were faxed on 02/20/2023. Applications uploaded to documents in chart.  Arbutus Leas, PharmD, BCPS Clinical Pharmacist [facility] [phone]

## 2023-03-06 ENCOUNTER — Other Ambulatory Visit: Payer: Self-pay

## 2023-03-06 ENCOUNTER — Ambulatory Visit (HOSPITAL_BASED_OUTPATIENT_CLINIC_OR_DEPARTMENT_OTHER)
Admission: RE | Admit: 2023-03-06 | Discharge: 2023-03-06 | Disposition: A | Discharge status: Home / Self Care | Payer: PPO | Source: Home / Self Care | Attending: Surgery | Admitting: Surgery

## 2023-03-06 ENCOUNTER — Encounter (HOSPITAL_COMMUNITY): Payer: Self-pay | Admitting: Surgery

## 2023-03-06 ENCOUNTER — Ambulatory Visit (HOSPITAL_BASED_OUTPATIENT_CLINIC_OR_DEPARTMENT_OTHER): Payer: PPO

## 2023-03-06 ENCOUNTER — Encounter (HOSPITAL_COMMUNITY)
Admission: RE | Disposition: A | Discharge status: Home / Self Care | Payer: Self-pay | Source: Home / Self Care | Attending: Surgery

## 2023-03-06 DIAGNOSIS — I7025 Atherosclerosis of native arteries of other extremities with ulceration: Secondary | ICD-10-CM

## 2023-03-06 DIAGNOSIS — Z79899 Other long term (current) drug therapy: Secondary | ICD-10-CM | POA: Insufficient documentation

## 2023-03-06 DIAGNOSIS — E11621 Type 2 diabetes mellitus with foot ulcer: Secondary | ICD-10-CM | POA: Insufficient documentation

## 2023-03-06 DIAGNOSIS — I70235 Atherosclerosis of native arteries of right leg with ulceration of other part of foot: Secondary | ICD-10-CM

## 2023-03-06 DIAGNOSIS — Z7984 Long term (current) use of oral hypoglycemic drugs: Secondary | ICD-10-CM | POA: Insufficient documentation

## 2023-03-06 DIAGNOSIS — Z7901 Long term (current) use of anticoagulants: Secondary | ICD-10-CM | POA: Insufficient documentation

## 2023-03-06 DIAGNOSIS — Z87891 Personal history of nicotine dependence: Secondary | ICD-10-CM | POA: Insufficient documentation

## 2023-03-06 DIAGNOSIS — I70245 Atherosclerosis of native arteries of left leg with ulceration of other part of foot: Secondary | ICD-10-CM | POA: Insufficient documentation

## 2023-03-06 DIAGNOSIS — Z794 Long term (current) use of insulin: Secondary | ICD-10-CM | POA: Insufficient documentation

## 2023-03-06 DIAGNOSIS — M62261 Nontraumatic ischemic infarction of muscle, right lower leg: Secondary | ICD-10-CM

## 2023-03-06 DIAGNOSIS — I708 Atherosclerosis of other arteries: Secondary | ICD-10-CM

## 2023-03-06 DIAGNOSIS — E1151 Type 2 diabetes mellitus with diabetic peripheral angiopathy without gangrene: Secondary | ICD-10-CM | POA: Insufficient documentation

## 2023-03-06 DIAGNOSIS — L97529 Non-pressure chronic ulcer of other part of left foot with unspecified severity: Secondary | ICD-10-CM | POA: Insufficient documentation

## 2023-03-06 DIAGNOSIS — L97519 Non-pressure chronic ulcer of other part of right foot with unspecified severity: Secondary | ICD-10-CM | POA: Insufficient documentation

## 2023-03-06 DIAGNOSIS — Z7985 Long-term (current) use of injectable non-insulin antidiabetic drugs: Secondary | ICD-10-CM | POA: Insufficient documentation

## 2023-03-06 HISTORY — PX: ABDOMINAL AORTOGRAM W/LOWER EXTREMITY: CATH118223

## 2023-03-06 LAB — POCT I-STAT, CHEM 8
BUN: 10 mg/dL (ref 8–23)
Calcium, Ion: 1.23 mmol/L (ref 1.15–1.40)
Chloride: 103 mmol/L (ref 98–111)
Creatinine, Ser: 0.8 mg/dL (ref 0.44–1.00)
Glucose, Bld: 119 mg/dL — ABNORMAL HIGH (ref 70–99)
HCT: 36 % (ref 36.0–46.0)
Hemoglobin: 12.2 g/dL (ref 12.0–15.0)
Potassium: 4.3 mmol/L (ref 3.5–5.1)
Sodium: 141 mmol/L (ref 135–145)
TCO2: 26 mmol/L (ref 22–32)

## 2023-03-06 LAB — GLUCOSE, CAPILLARY: Glucose-Capillary: 101 mg/dL — ABNORMAL HIGH (ref 70–99)

## 2023-03-06 SURGERY — ABDOMINAL AORTOGRAM W/LOWER EXTREMITY
Anesthesia: LOCAL

## 2023-03-06 MED ORDER — MIDAZOLAM HCL 2 MG/2ML IJ SOLN
INTRAMUSCULAR | Status: DC | PRN
  Administered 2023-03-06: 2 mg via INTRAVENOUS

## 2023-03-06 MED ORDER — MIDAZOLAM HCL 2 MG/2ML IJ SOLN
INTRAMUSCULAR | Status: AC
  Filled 2023-03-06: qty 2

## 2023-03-06 MED ORDER — LIDOCAINE HCL (PF) 1 % IJ SOLN
INTRAMUSCULAR | Status: DC | PRN
  Administered 2023-03-06 (×2): 15 mL via INTRADERMAL

## 2023-03-06 MED ORDER — LABETALOL HCL 5 MG/ML IV SOLN: 10.0000 mg | INTRAVENOUS | Status: DC | PRN

## 2023-03-06 MED ORDER — HYDROMORPHONE HCL 1 MG/ML IJ SOLN: 0.5000 mg | INTRAMUSCULAR | Status: DC | PRN

## 2023-03-06 MED ORDER — HEPARIN (PORCINE) IN NACL 2000-0.9 UNIT/L-% IV SOLN
INTRAVENOUS | Status: DC | PRN
  Administered 2023-03-06: 1000 mL

## 2023-03-06 MED ORDER — SODIUM CHLORIDE 0.9 % IV SOLN: 250.0000 mL | INTRAVENOUS | Status: DC | PRN

## 2023-03-06 MED ORDER — HYDRALAZINE HCL 20 MG/ML IJ SOLN: 5.0000 mg | INTRAMUSCULAR | Status: DC | PRN

## 2023-03-06 MED ORDER — LIDOCAINE HCL (PF) 1 % IJ SOLN
INTRAMUSCULAR | Status: AC
  Filled 2023-03-06: qty 30

## 2023-03-06 MED ORDER — FENTANYL CITRATE (PF) 100 MCG/2ML IJ SOLN
INTRAMUSCULAR | Status: DC | PRN
  Administered 2023-03-06: 50 ug via INTRAVENOUS

## 2023-03-06 MED ORDER — ACETAMINOPHEN 325 MG PO TABS: 650.0000 mg | ORAL_TABLET | ORAL | Status: DC | PRN

## 2023-03-06 MED ORDER — SODIUM CHLORIDE 0.9% FLUSH
3.0000 mL | Freq: Two times a day (BID) | INTRAVENOUS | Status: DC
Start: 2023-03-06 — End: 2023-03-06

## 2023-03-06 MED ORDER — FENTANYL CITRATE (PF) 100 MCG/2ML IJ SOLN
INTRAMUSCULAR | Status: AC
  Filled 2023-03-06: qty 2

## 2023-03-06 MED ORDER — SODIUM CHLORIDE 0.9 % WEIGHT BASED INFUSION: 1.0000 mL/kg/h | INTRAVENOUS | Status: DC

## 2023-03-06 MED ORDER — IODIXANOL 320 MG/ML IV SOLN
INTRAVENOUS | Status: DC | PRN
  Administered 2023-03-06: 55 mL via INTRA_ARTERIAL

## 2023-03-06 MED ORDER — SODIUM CHLORIDE 0.9% FLUSH
3.0000 mL | INTRAVENOUS | Status: DC | PRN
Start: 2023-03-06 — End: 2023-03-06

## 2023-03-06 MED ORDER — ONDANSETRON HCL 4 MG/2ML IJ SOLN
4.0000 mg | Freq: Four times a day (QID) | INTRAMUSCULAR | Status: DC | PRN
  Administered 2023-03-06: 4 mg via INTRAVENOUS
  Filled 2023-03-06: qty 2

## 2023-03-06 MED ORDER — SODIUM CHLORIDE 0.9 % IV SOLN: INTRAVENOUS | Status: DC

## 2023-03-06 MED ORDER — OXYCODONE HCL 5 MG PO TABS: 5.0000 mg | ORAL_TABLET | ORAL | Status: DC | PRN

## 2023-03-06 SURGICAL SUPPLY — 8 items
CATH OMNI FLUSH 5F 65CM (CATHETERS) IMPLANT
COVER DOME SNAP 22 D (MISCELLANEOUS) IMPLANT
KIT MICROPUNCTURE NIT STIFF (SHEATH) IMPLANT
SET ATX-X65L (MISCELLANEOUS) IMPLANT
SHEATH PINNACLE 5F 10CM (SHEATH) IMPLANT
TRANSDUCER W/STOPCOCK (MISCELLANEOUS) ×2 IMPLANT
TRAY PV CATH (CUSTOM PROCEDURE TRAY) ×2 IMPLANT
WIRE BENTSON .035X145CM (WIRE) IMPLANT

## 2023-03-06 NOTE — Progress Notes (Signed)
PCP - Dr. Sanda Linger Cardiologist - Dr. Donato Schultz EP- Dr. Loman Brooklyn  PPM/ICD - denies   Chest x-ray - 05/18/22 EKG - 02/26/23 Stress Test - denies ECHO - 10/07/20 Cardiac Cath - denies  Sleep Study - denies   Fasting Blood Sugar - 100-120 Checks Blood Sugar twice a day  Last dose of GLP1 agonist-  11/1 GLP1 instructions: Hold medication until instructed after surgery  Blood Thinner Instructions: Continue to hold Xarelto until instructed after surgery Aspirin Instructions: n/a  ERAS Protcol - no, NPO    COVID TEST- n/a   Anesthesia review: yes, cardiac hx  Patient denies shortness of breath, fever, cough and chest pain at PAT appointment   All instructions explained to the patient, with a verbal understanding of the material. Patient agrees to go over the instructions while at home for a better understanding.  The opportunity to ask questions was provided.  Pt was in procedural short stay. I was able to go over her questions for surgery and give her instructions and CHG soap. Labs were supposed to be drawn, but were not. Pt will need to sign consent DOS due to having sedation today.

## 2023-03-06 NOTE — Pre-Procedure Instructions (Signed)
Surgical Instructions   Your procedure is scheduled on Friday, November 8th. Report to [facility] Main Entrance "A" at 1030 A.M., then check in with the Admitting office. Any questions or running late day of surgery: call [phone]  Questions prior to your surgery date: call [phone], Monday-Friday, 8am-4pm. If you experience any cold or flu symptoms such as cough, fever, chills, shortness of breath, etc. between now and your scheduled surgery, please notify us at the above number.     Remember:  Do not eat or drink after midnight the night before your surgery     Take these medicines the morning of surgery with A SIP OF WATER diltiazem (CARDIZEM CD)  dorzolamide-timolol (COSOPT)  doxycycline (ADOXA)  ezetimibe (ZETIA)  hydrALAZINE (APRESOLINE)  nebivolol (BYSTOLIC)  omeprazole (PRILOSEC)  rosuvastatin (CRESTOR)   May take these medicines IF NEEDED: cetirizine (ZYRTEC)    Continue holding Xarelto until after surgery.  One week prior to surgery, STOP taking any Aspirin (unless otherwise instructed by your surgeon) Aleve, Naproxen, Ibuprofen, Motrin, Advil, Goody's, BC's, all herbal medications, fish oil, and non-prescription vitamins.  WHAT DO I DO ABOUT MY DIABETES MEDICATION?   Hold Empagliflozin-metFORMIN HCl (SYNJARDY 72 hours prior to surgery. Last dose 11/4.  THE NIGHT BEFORE SURGERY, take 25 units (50%) of Insulin Glargine (BASAGLAR KWIKPEN)       Hold Semaglutide (Ozempic) for 7 days prior to surgery. Last dose no later than 10/31.  If your CBG is greater than 220 mg/dL, you may take  (7 units) of your sliding scale (correction) dose of HUMALOG KWIKPEN.   HOW TO MANAGE YOUR DIABETES BEFORE AND AFTER SURGERY  Why is it important to control my blood sugar before and after surgery? Improving blood sugar levels before and after surgery helps healing and can limit problems. A way of improving blood sugar control is eating a healthy diet by:  Eating less  sugar and carbohydrates  Increasing activity/exercise  Talking with your doctor about reaching your blood sugar goals High blood sugars (greater than 180 mg/dL) can raise your risk of infections and slow your recovery, so you will need to focus on controlling your diabetes during the weeks before surgery. Make sure that the doctor who takes care of your diabetes knows about your planned surgery including the date and location.  How do I manage my blood sugar before surgery? Check your blood sugar at least 4 times a day, starting 2 days before surgery, to make sure that the level is not too high or low.  Check your blood sugar the morning of your surgery when you wake up and every 2 hours until you get to the Short Stay unit.  If your blood sugar is less than 70 mg/dL, you will need to treat for low blood sugar: Do not take insulin. Treat a low blood sugar (less than 70 mg/dL) with  cup of clear juice (cranberry or apple), 4 glucose tablets, OR glucose gel. Recheck blood sugar in 15 minutes after treatment (to make sure it is greater than 70 mg/dL). If your blood sugar is not greater than 70 mg/dL on recheck, call [phone] for further instructions. Report your blood sugar to the short stay nurse when you get to Short Stay.  If you are admitted to the hospital after surgery: Your blood sugar will be checked by the staff and you will probably be given insulin after surgery (instead of oral diabetes medicines) to make sure you have good blood sugar levels. The goal for  blood sugar control after surgery is 80-180 mg/dL.                     Do NOT Smoke (Tobacco/Vaping) for 24 hours prior to your procedure.  If you use a CPAP at night, you may bring your mask/headgear for your overnight stay.   You will be asked to remove any contacts, glasses, piercing's, hearing aid's, dentures/partials prior to surgery. Please bring cases for these items if needed.    Patients discharged the day of  surgery will not be allowed to drive home, and someone needs to stay with them for 24 hours.  SURGICAL WAITING ROOM VISITATION Patients may have no more than 2 support people in the waiting area - these visitors may rotate.   Pre-op nurse will coordinate an appropriate time for 1 ADULT support person, who may not rotate, to accompany patient in pre-op.  Children under the age of 38 must have an adult with them who is not the patient and must remain in the main waiting area with an adult.  If the patient needs to stay at the hospital during part of their recovery, the visitor guidelines for inpatient rooms apply.  Please refer to the [facility] website for the visitor guidelines for any additional information.   If you received a COVID test during your pre-op visit  it is requested that you wear a mask when out in public, stay away from anyone that may not be feeling well and notify your surgeon if you develop symptoms. If you have been in contact with anyone that has tested positive in the last 10 days please notify you surgeon.      Pre-operative CHG Bathing Instructions   You can play a key role in reducing the risk of infection after surgery. Your skin needs to be as free of germs as possible. You can reduce the number of germs on your skin by washing with CHG (chlorhexidine gluconate) soap before surgery. CHG is an antiseptic soap that kills germs and continues to kill germs even after washing.   DO NOT use if you have an allergy to chlorhexidine/CHG or antibacterial soaps. If your skin becomes reddened or irritated, stop using the CHG and notify one of our RNs at [phone].              TAKE A SHOWER THE NIGHT BEFORE SURGERY AND THE DAY OF SURGERY    Please keep in mind the following:  DO NOT shave, including legs and underarms, 48 hours prior to surgery.   You may shave your face before/day of surgery.  Place clean sheets on your bed the night before surgery Use a clean  washcloth (not used since being washed) for each shower. DO NOT sleep with pet's night before surgery.  CHG Shower Instructions:  Wash your face and private area with normal soap. If you choose to wash your hair, wash first with your normal shampoo.  After you use shampoo/soap, rinse your hair and body thoroughly to remove shampoo/soap residue.  Turn the water OFF and apply half the bottle of CHG soap to a CLEAN washcloth.  Apply CHG soap ONLY FROM YOUR NECK DOWN TO YOUR TOES (washing for 3-5 minutes)  DO NOT use CHG soap on face, private areas, open wounds, or sores.  Pay special attention to the area where your surgery is being performed.  If you are having back surgery, having someone wash your back for you may be helpful. Wait  2 minutes after CHG soap is applied, then you may rinse off the CHG soap.  Pat dry with a clean towel  Put on clean pajamas    Additional instructions for the day of surgery: DO NOT APPLY any lotions, deodorants, cologne, or perfumes.   Do not wear jewelry or makeup Do not wear nail polish, gel polish, artificial nails, or any other type of covering on natural nails (fingers and toes) Do not bring valuables to the hospital. [facility] is not responsible for valuables/personal belongings. Put on clean/comfortable clothes.  Please brush your teeth.  Ask your nurse before applying any prescription medications to the skin.

## 2023-03-06 NOTE — Progress Notes (Signed)
Anesthesia Chart Review:  Case: [id number] Date/Time: 03/09/23 1242   Procedure: RIGHT FEMORAL-POSTERIOR TIBIAL ARTERY BYPASS GRAFT (Right)   Anesthesia type: General   Pre-op diagnosis: Atherosclerosis of native arteries of the extremities with ulceration   [facility] ROOM 11 / [facility] OR   Surgeons: Nada Libman, MD       DISCUSSION: Patient is a 67 year old female scheduled for the above procedure.   History includes former smoker (quit 09/05/01), post-operative N/V, HTN, PAF (s/p DCCV 09/19/19, afib ablation 08/02/21), PAD (left EIA & CFA endarterectomies, left femoral-popliteal artery bypass with GSV graft 06/08/14; redo left FPBG with 6mm Propaten graft due to early occlusion 06/09/14; left 2nd toe amputation 07/16/14, left FPBG with silent occlusion by 04/12/15 arterial Duplex), DM2, IDA, GERD, glaucoma, laparoscopic gastric banding (05/29/05, revision 04/08/10), abdominoplasty (08/2007), nephrolithiasis, Barrett's esophagus (with high grade dysplasia, s/p endoscopic radiofrequency ablation 11/06/18, 02/12/19, 08/04/19). She had mild-moderate mitral stenosis on 09/2020 echo. Elevated coronary calcium score by 06/2021 CT cardiac morphology prior to afib ablation.  Last visit cardiology visit was on 02/26/23 with Joni Reining, DNP for recurrent aflutter.  Previous afib DCCV in 2021 and afib ablation in April 2023. She was in rapid atrial flutter at 09/19/22 Afib Clinic follow-up. As needed diltiazem 30 mg added to her daily 120 mg. Nebivolol and Xarelto continued. She was scheduled for DCCV but was back in NSR by 09/29/22. If persistent recurrence she may be considered for repeat ablation. On 02/20/23, PCP Dr. Yetta Barre noted she was back in atrial flutter at 72 bpm. She had been taking as needed diltiazem nearly daily for palpitations. No chest pain or dyspnea. She was back in SR when EKG was repeated at 02/26/23 cardiology visit. Diltiazem increased to 180 mg. CHA2DS2-VASc score 5, and Xarelto continued. June  2022 echo showed LVEF 65-70%, mild LVH, grade 2 diastolic dysfunction, normal RV systolic function, severe mitral annular calcification with mild-moderate MS with MV peak gradient 12.2 mmHg and mean MV gradient 4.0 mmHg. One month office follow-up with APP planned, and she will consider timing of next surveillance echocardiogram then. Follow-up visit is scheduled for 03/27/23.   Since then, she had vascular surgery follow-up on 02/28/23 for new wounds on both feet. Podiatry had started her on doxycycline. Left great toe and 3rd toe with blood blisters. Right great toe with ischemic ulceration and surrounding erythema. RLE arteriogram recommended. Xarelto held for 3 days per VVS.  S/p Aortogram with RLE angiogram on 03/06/23 showing questionable right CIA lesion and flush occlusion right SFA with reconstitution in the PT and AT. AT occluded after the ankle. Right Femoral-PT artery bypass recommended for limb salvage and is scheduled for 03/09/23.   A1c 8.4% 02/20/23. Last semaglutide 03/02/23 and advised to hold until after surgery. Also on Synjardy BID, Humalog 14 units TID, and insulin glargine 50 units Q HS.   She has limb threatening RLE ischemia per vascular notes with right FTBG recommended. She was back in SR by 02/26/23 EKG. No tachycardia by flowsheet during 03/06/23 PV procedure stay. Xarelto is on hold for surgery. Updated labs on arrival as indicated per vascular orders. Anesthesia team to evaluate on the day of surgery.    VS:  Wt Readings from Last 3 Encounters:  03/06/23 76.7 kg  02/28/23 76.5 kg  02/26/23 77.9 kg   BP Readings from Last 3 Encounters:  03/06/23 122/64  02/28/23 (!) 164/66  02/26/23 (!) 146/94   Pulse Readings from Last 3 Encounters:  03/06/23 [name])  56  02/28/23 (!) 55  02/26/23 60     PROVIDERS: Etta Grandchild, MD is PCP  Donato Schultz, MD is cardiologist Loman Brooklyn, MD is EP  Hoy Finlay, MD is GI. 08/09/22 EGD showed no endoscopic evidence of  Barrett's esophagus, esophagitis, or mucosal abnormalities or stricture of the esophagus. There was extrinsic compression of the stomach from a LapBand, but otherwise normal stomach and examined duodenum.    LABS: Most recent lab results in [facility] include: Lab Results  Component Value Date   WBC 10.1 02/20/2023   HGB 12.2 03/06/2023   HCT 36.0 03/06/2023   PLT 320.0 02/20/2023   GLUCOSE 119 (H) 03/06/2023   ALT 16 02/20/2023   AST 12 02/20/2023   NA 141 03/06/2023   K 4.3 03/06/2023   CL 103 03/06/2023   CREATININE 0.80 03/06/2023   BUN 10 03/06/2023   CO2 26 02/20/2023   TSH 0.58 09/13/2022   HGBA1C 8.4 (H) 02/20/2023   MICROALBUR <0.7 02/20/2023    EKG: 02/26/23: Normal sinus rhythm Normal ECG When compared with ECG of 19-Sep-2022 09:28, Vent. rate has decreased BY 81 BPM Nonspecific T wave abnormality no longer evident in Lateral leads Confirmed by Joni Reining [phone]) on 02/26/2023 9:11:24 AM   CV: CT Cardiac Morph (Pre Afib Ablation) 07/28/21: IMPRESSION: 1. There is normal pulmonary vein drainage into the left atrium with ostial measurements above. 2. There is no thrombus in the left atrial appendage. 3. The esophagus runs in the left atrial midline and is not in proximity to any of the pulmonary vein ostia. 4 . No PFO/ASD. 5. Normal coronary origin. Right dominance. 6. CAC score of 704 which is 98 percentile for age-, race-, and sex-matched controls. 7. Mildly dilated main pulmonary artery (35 mm).   Echo 10/07/20: IMPRESSIONS   1. Left ventricular ejection fraction, by estimation, is 65 to 70%. The  left ventricle has normal function. The left ventricle has no regional  wall motion abnormalities. There is mild left ventricular hypertrophy.  Left ventricular diastolic parameters  are consistent with Grade II diastolic dysfunction (pseudonormalization).  Elevated left atrial pressure.   2. Right ventricular systolic function is normal. The right  ventricular  size is normal. Tricuspid regurgitation signal is inadequate for assessing  PA pressure.   3. Left atrial size was severely dilated.   4. Right atrial size was mildly dilated.   5. The mitral valve is abnormal. Severe mitral annular calcification. No  evidence of mitral valve regurgitation. Mild to moderate mitral stenosis.  MV peak gradient, 12.2 mmHg. The mean mitral valve  gradient is 4.0 mmHg. MVA 1.4 cm^2 by continuity equation   6. The aortic valve is tricuspid. Aortic valve regurgitation is not  visualized. Mild to moderate aortic valve sclerosis/calcification is  present, without any evidence of aortic stenosis.   7. The inferior vena cava is normal in size with greater than 50%  respiratory variability, suggesting right atrial pressure of 3 mmHg.    US Carotid 10/25/20: Summary:  - Right Carotid: Velocities in the right ICA are consistent with a 1-39%  stenosis.  - Left Carotid: Velocities in the left ICA are consistent with a 1-39%  stenosis.  - Vertebrals: Bilateral vertebral arteries demonstrate antegrade flow.  - Subclavians: Normal flow hemodynamics were seen in bilateral subclavian arteries.     Past Medical History:  Diagnosis Date   Allergy    Anxiety    Pt. denies   Arthritis    right index  figer   Asymptomatic cholelithiasis    Atherosclerosis of aorta (HCC)    Cataract    Clotting disorder (HCC)    Gallstones 06/2013   GERD (gastroesophageal reflux disease)    Glaucoma    "A LITTLE BIT"   History of kidney stones    lithotrispy   Hyperlipidemia    Hypertension    Hypothyroidism    no meds   Iron deficiency anemia    PAD (peripheral artery disease) (HCC)    PAF (paroxysmal atrial fibrillation) (HCC)    Peripheral arterial occlusive disease (HCC)    lower extremities   Pneumonia    PONV (postoperative nausea and vomiting)    Right ureteral stone    Type 2 diabetes mellitus (HCC)    Type   Vitamin B 12 deficiency    Wears glasses      Past Surgical History:  Procedure Laterality Date   ABDOMINAL AORTOGRAM W/LOWER EXTREMITY N/A 03/09/2020   Procedure: ABDOMINAL AORTOGRAM W/LOWER EXTREMITY;  Surgeon: Nada Libman, MD;  [facility]: [facility] INVASIVE CV LAB;  Service: Cardiovascular;  Laterality: N/A;   AMPUTATION Left 07/16/2014   Procedure: LEFT SECOND TOE AMPUTATION;  Surgeon: Nada Libman, MD;  Location[facility] OR;  Service: Vascular;  Laterality: Left;  With Nerve block   ATRIAL FIBRILLATION ABLATION N/A 08/02/2021   Procedure: ATRIAL FIBRILLATION ABLATION;  Surgeon: Regan Lemming, MD;  [facility]: [facility] INVASIVE CV LAB;  Service: Cardiovascular;  Laterality: N/A;   AUGMENTATION MAMMAPLASTY     BELPHAROPTOSIS REPAIR     eyelid lift   BIOPSY  10/14/2018   Procedure: BIOPSY;  Surgeon: Meridee Score Netty Starring., MD;  Location: [facility] ENDOSCOPY;  Service: Gastroenterology;;   BIOPSY  08/04/2019   Procedure: BIOPSY;  Surgeon: Lemar Lofty., MD;  Location: [facility] ENDOSCOPY;  Service: Gastroenterology;;   CARDIOVERSION N/A 09/19/2019   Procedure: CARDIOVERSION;  Surgeon: Jodelle Red, MD;  Location: [facility] ENDOSCOPY;  Service: Cardiovascular;  Laterality: N/A;   COLONOSCOPY     COMBINED AUGMENTATION MAMMAPLASTY AND ABDOMINOPLASTY  2009   W/  BILATERAL  THIGH LIFT   CYSTO/  RIGHT URETERAL STENT PLACEMENT  12/30/2010   CYSTOSCOPY WITH RETROGRADE PYELOGRAM, URETEROSCOPY AND STENT PLACEMENT Right 10/07/2013   Procedure: CYSTOSCOPY WITH RETROGRADE PYELOGRAM, right URETEROSCOPY AND STENT PLACEMENT, stone extraction;  Surgeon: Danae Chen, MD;  Location[facility];  Service: Urology;  Laterality: Right;   CYSTOSCOPY WITH STENT PLACEMENT Right 06/04/2013   Procedure: CYSTOSCOPY WITH STENT PLACEMENT;  Surgeon: Marcine Matar, MD;  Location: [facility] ORS;  Service: Urology;  Laterality: Right;   DILATATION & CURETTAGE/HYSTEROSCOPY WITH MYOSURE N/A 04/27/2015   Procedure: DILATATION & CURETTAGE/HYSTEROSCOPY WITH  MYOSURE;  Surgeon: Ok Edwards, MD;  Location: [facility] ORS;  Service: Gynecology;  Laterality: N/A;   ENDARTERECTOMY FEMORAL Left 06/08/2014   Procedure: Left Leg Common Femoral and External Iliac  Endartarectomy with patch Angioplasty;  Surgeon: Larina Earthly, MD;  Location: [facility] OR;  Service: Vascular;  Laterality: Left;   ESOPHAGOGASTRODUODENOSCOPY N/A 10/14/2018   Procedure: ESOPHAGOGASTRODUODENOSCOPY (EGD);  Surgeon: Lemar Lofty., MD;  Location: [name] ENDOSCOPY;  Service: Gastroenterology;  Laterality: N/A;   ESOPHAGOGASTRODUODENOSCOPY (EGD) WITH PROPOFOL N/A 11/06/2018   Procedure: ESOPHAGOGASTRODUODENOSCOPY (EGD) WITH PROPOFOL;  Surgeon: Meridee Score Netty Starring., MD;  Location: [facility] ENDOSCOPY;  Service: Gastroenterology;  Laterality: N/A;  RFA   ESOPHAGOGASTRODUODENOSCOPY (EGD) WITH PROPOFOL N/A 02/12/2019   Procedure: ESOPHAGOGASTRODUODENOSCOPY (EGD) WITH PROPOFOL;  Surgeon: Meridee Score Netty Starring., MD;  Location: [facility] ENDOSCOPY;  Service: Gastroenterology;  Laterality: N/A;   ESOPHAGOGASTRODUODENOSCOPY (EGD) WITH PROPOFOL N/A 08/04/2019   Procedure: ESOPHAGOGASTRODUODENOSCOPY (EGD) WITH PROPOFOL;  Surgeon: Meridee Score Netty Starring., MD;  Location: [facility] ENDOSCOPY;  Service: Gastroenterology;  Laterality: N/A;  WITH RFA   EXTRACORPOREAL SHOCK WAVE LITHOTRIPSY Right 08-04-2013//   06-23-2013//   01-16-2011   EYE SURGERY Bilateral    cataract   FEMORAL-POPLITEAL BYPASS GRAFT Left 06/08/2014   Procedure: Left Leg Femoral -Popliteal Bypass Graft;  Surgeon: Larina Earthly, MD;  Location: [facility] OR;  Service: Vascular;  Laterality: Left;   FEMORAL-POPLITEAL BYPASS GRAFT Left 06/09/2014   Procedure: Left Femoral and Popliteal Exposure; Left Femoral to Anterior Tibial Bypass Graft using Propaten 6mm by 80cm Goretex Graft; Left Tibial Endarterectomy; Left Femoraland Popliteal Thrombectomy ;  Surgeon: Nada Libman, MD;  Location: [facility] OR;  Service: Vascular;  Laterality: Left;   GI RADIOFREQUENCY ABLATION N/A  11/06/2018   Procedure: GI RADIOFREQUENCY ABLATION;  Surgeon: Lemar Lofty., MD;  Location: [facility] ENDOSCOPY;  Service: Gastroenterology;  Laterality: N/A;   GI RADIOFREQUENCY ABLATION N/A 02/12/2019   Procedure: GI RADIOFREQUENCY ABLATION;  Surgeon: Meridee Score Netty Starring., MD;  Location: [facility] ENDOSCOPY;  Service: Gastroenterology;  Laterality: N/A;   GI RADIOFREQUENCY ABLATION N/A 08/04/2019   Procedure: GI RADIOFREQUENCY ABLATION;  Surgeon: Meridee Score Netty Starring., MD;  Location: [facility] ENDOSCOPY;  Service: Gastroenterology;  Laterality: N/A;   HOLMIUM LASER APPLICATION Right 10/07/2013   Procedure: HOLMIUM LASER APPLICATION;  Surgeon: Danae Chen, MD;  Location: [facility];  Service: Urology;  Laterality: Right;   KIDNEY STONE SURGERY  07/2013   1-2 stones   LAPAROSCOPIC GASTRIC BANDING  05/29/2005   LITHOTRIPSY  2-3 times   LOWER EXTREMITY ANGIOGRAM N/A 06/04/2014   Procedure: LOWER EXTREMITY ANGIOGRAM;  Surgeon: Nada Libman, MD;  Location: [facility] CATH LAB;  Service: Cardiovascular;  Laterality: N/A;   ORIF FIFTH METACARPAL FX  RIGHT HAND  04/21/2002   POLYPECTOMY     REVISION AND RE-SITING LAP-BAND PORT  04/08/2010   W/  UPPER EGD   RIGHT KNEE PATELLECTOMY W/ REPAIR OF EXTENSOR MECHANISM  04/14/2002   TEE WITHOUT CARDIOVERSION N/A 12/24/2019   Procedure: TRANSESOPHAGEAL ECHOCARDIOGRAM (TEE);  Surgeon: Quintella Reichert, MD;  Location: [facility] ENDOSCOPY;  Service: Cardiovascular;  Laterality: N/A;   Toenail removed Left 05/21/2014   2nd toenail-  Dr. Elvin So    MEDICATIONS: No current facility-administered medications for this encounter.    Blood Glucose Monitoring Suppl (ONETOUCH VERIO IQ SYSTEM) w/Device KIT   cetirizine (ZYRTEC) 10 MG tablet   diltiazem (CARDIZEM CD) 180 MG 24 hr capsule   diltiazem (CARDIZEM) 30 MG tablet   dorzolamide-timolol (COSOPT) 22.3-6.8 MG/ML ophthalmic solution   doxycycline (ADOXA) 100 MG tablet   Empagliflozin-metFORMIN HCl (SYNJARDY)  5-500 MG TABS   ezetimibe (ZETIA) 10 MG tablet   HUMALOG KWIKPEN 100 UNIT/ML KwikPen   hydrALAZINE (APRESOLINE) 50 MG tablet   Insulin Glargine (BASAGLAR KWIKPEN) 100 UNIT/ML   Insulin Pen Needle (PEN NEEDLES) 32G X 4 MM MISC   latanoprost (XALATAN) 0.005 % ophthalmic solution   MAGnesium-Oxide 400 (240 Mg) MG tablet   nebivolol (BYSTOLIC) 10 MG tablet   omeprazole (PRILOSEC) 40 MG capsule   ONETOUCH VERIO test strip   rosuvastatin (CRESTOR) 5 MG tablet   Semaglutide, 1 MG/DOSE, (OZEMPIC, 1 MG/DOSE,) 4 MG/3ML SOPN   XARELTO 20 MG TABS tablet    0.9 %  sodium chloride infusion   0.9 %  sodium chloride infusion   0.9% sodium chloride infusion  acetaminophen (TYLENOL) tablet 650 mg   fentaNYL (SUBLIMAZE) injection   Heparin (Porcine) in NaCl 2000-0.9 UNIT/L-% SOLN   hydrALAZINE (APRESOLINE) injection 5 mg   HYDROmorphone (DILAUDID) injection 0.5-1 mg   iodixanol (VISIPAQUE) 320 MG/ML injection   labetalol (NORMODYNE) injection 10 mg   lidocaine (PF) (XYLOCAINE) 1 % injection   midazolam (VERSED) injection   ondansetron (ZOFRAN) injection 4 mg   oxyCODONE (Oxy IR/ROXICODONE) immediate release tablet 5-10 mg   sodium chloride flush (NS) 0.9 % injection 3 mL   sodium chloride flush (NS) 0.9 % injection 3 mL    Shonna Chock, PA-C Surgical Short Stay/Anesthesiology [facility] Phone [phone] [facility] Phone [phone] 03/07/2023 9:38 AM

## 2023-03-06 NOTE — Progress Notes (Signed)
Lower extremity venous duplex completed. Please see CV Procedures for preliminary results.  Shona Simpson, RVT 03/06/23 2:23 PM

## 2023-03-06 NOTE — Interval H&P Note (Signed)
History and Physical Interval Note:  03/06/2023 8:47 AM  Theresa Harrington  has presented today for surgery, with the diagnosis of ischemia with ulceration of foot.  The various methods of treatment have been discussed with the patient and family. After consideration of risks, benefits and other options for treatment, the patient has consented to  Procedure(s): ABDOMINAL AORTOGRAM W/LOWER EXTREMITY (N/A) as a surgical intervention.  The patient's history has been reviewed, patient examined, no change in status, stable for surgery.  I have reviewed the patient's chart and labs.  Questions were answered to the patient's satisfaction.     Durene Cal

## 2023-03-06 NOTE — Progress Notes (Signed)
Site area: lt groin Site Prior to Removal:  Level 0 Pressure Applied For: 25 minutes Manual:   yes Patient Status During Pull:  stable  Post Pull Site:  Level 0 Post Pull Instructions Given:  yes Post Pull Pulses Present: rt dp and pt dopplered; left AT doppled Dressing Applied:  gauze and tegaderm Bedrest begins @ 1105 Comments:

## 2023-03-06 NOTE — Op Note (Signed)
    Patient name: Eloni Darius MRN: [id number] DOB: Feb 07, 1956 Sex: female  03/06/2023 Pre-operative Diagnosis: Right toe ulcer Post-operative diagnosis:  Same Surgeon:  Durene Cal Procedure Performed:  1.  Ultrasound-guided access, left femoral artery  2.  Abdominal aortogram with bifemoral runoff  3.  Right leg angiogram  4.  Catheter in right external iliac artery from left common femoral artery  5.  Conscious sedation, 49 minutes   Indications: This is a 67 year old female who comes in today for angiography for surgical planning.  She has a new right great toe ulcer.  Procedure:  The patient was identified in the holding area and taken to room 8.  The patient was then placed supine on the table and prepped and draped in the usual sterile fashion.  A time out was called.  Conscious sedation was administered with the use of IV fentanyl and Versed under continuous physician and nurse monitoring.  Heart rate, blood pressure, and oxygen saturation were continuously monitored.  Total sedation time was 49 minutes.  Ultrasound was used to evaluate the left common femoral artery.  It was patent .  A digital ultrasound image was acquired.  A micropuncture needle was used to access the left common femoral artery under ultrasound guidance.  An 018 wire was advanced without resistance and a micropuncture sheath was placed.  The 018 wire was removed and a benson wire was placed.  The micropuncture sheath was exchanged for a 5 french sheath.  An omniflush catheter was advanced over the wire to the level of L-1.  An abdominal angiogram was obtained.  Next, using the omniflush catheter and a benson wire, the aortic bifurcation was crossed and the catheter was placed into theright external iliac artery and right runoff was obtained.    Findings:   Aortogram: No evidence of renal artery stenosis.  The infrarenal abdominal aorta is widely patent.  The left common and external iliac arteries are widely patent.   There is calcific disease within the right common iliac artery.  This does not appear to be hemodynamically significant.  There was a 9 mm pressure gradient across the lesion.  The extrailiac artery is widely patent.  Heavily calcified right common femoral artery with greater than 70%  Right Lower Extremity: The right common femoral artery is heavily calcified with greater than 70% stenosis.  The profundofemoral artery is widely patent.  There is a flush occlusion of the superficial femoral artery.  There is reconstitution via collaterals of the anterior tibial and posterior tibial artery in the mid calf.  The anterior tibial artery appears to terminate after crossing the ankle.  The posterior tibial perfuses the digital vessels.  Left Lower Extremity: Not evaluated  Intervention: None  Impression:  #1  Questionable lesion in the right common iliac artery.  This was only associated with a 10 mm pressure gradient however it appeared to be visually much worse  #2  Flush occlusion of the right superficial femoral artery with reconstitution of the posterior tibial and anterior tibial artery.  The anterior tibial artery occludes after across the ankle  #3  The patient will be scheduled for a right femoral to posterior tibial bypass graft for limb salvage on Friday    V. Durene Cal, M.D., [facility] [place] Office: [phone] Pager:  [phone]

## 2023-03-07 ENCOUNTER — Encounter (HOSPITAL_COMMUNITY): Payer: Self-pay | Admitting: Surgery

## 2023-03-07 NOTE — Anesthesia Preprocedure Evaluation (Addendum)
Anesthesia Evaluation  Patient identified by MRN, date of birth, ID band Patient awake    Reviewed: Allergy & Precautions, NPO status , Patient's Chart, lab work & pertinent test results, reviewed documented beta blocker date and time   History of Anesthesia Complications (+) PONV and history of anesthetic complications  Airway Mallampati: III  TM Distance: >3 FB     Dental   Pulmonary neg shortness of breath, pneumonia, neg COPD, former smoker   breath sounds clear to auscultation       Cardiovascular hypertension, (-) angina + Peripheral Vascular Disease  (-) CAD and (-) Past MI + Valvular Problems/Murmurs (moderate MS)  Rhythm:Regular Rate:Normal     Neuro/Psych neg Seizures PSYCHIATRIC DISORDERS Anxiety Depression     Neuromuscular disease    GI/Hepatic ,GERD  ,,(+) neg Cirrhosis        Endo/Other  diabetes, Type 2Hypothyroidism    Renal/GU CRFRenal disease     Musculoskeletal  (+) Arthritis ,    Abdominal   Peds  Hematology  (+) Blood dyscrasia, anemia   Anesthesia Other Findings   Reproductive/Obstetrics                              Anesthesia Physical Anesthesia Plan  ASA: 3  Anesthesia Plan: General   Post-op Pain Management:    Induction: Intravenous  PONV Risk Score and Plan: 2 and Ondansetron  Airway Management Planned: Oral ETT  Additional Equipment: Arterial line  Intra-op Plan:   Post-operative Plan: Extubation in OR  Informed Consent: I have reviewed the patients History and Physical, chart, labs and discussed the procedure including the risks, benefits and alternatives for the proposed anesthesia with the patient or authorized representative who has indicated his/her understanding and acceptance.     Dental advisory given  Plan Discussed with: CRNA  Anesthesia Plan Comments: (PAT note written 03/07/2023 by Shonna Chock, PA-C.  )         Anesthesia Quick Evaluation

## 2023-03-09 ENCOUNTER — Other Ambulatory Visit: Payer: Self-pay

## 2023-03-09 ENCOUNTER — Inpatient Hospital Stay (HOSPITAL_COMMUNITY): Payer: PPO | Admitting: Vascular Surgery

## 2023-03-09 ENCOUNTER — Other Ambulatory Visit (HOSPITAL_COMMUNITY): Payer: Self-pay

## 2023-03-09 ENCOUNTER — Encounter (HOSPITAL_COMMUNITY): Payer: Self-pay | Admitting: Surgery

## 2023-03-09 ENCOUNTER — Encounter (HOSPITAL_COMMUNITY)
Admission: RE | Disposition: A | Discharge status: Home / Self Care | Payer: Self-pay | Source: Home / Self Care | Attending: Surgery

## 2023-03-09 ENCOUNTER — Inpatient Hospital Stay (HOSPITAL_COMMUNITY): Payer: PPO

## 2023-03-09 ENCOUNTER — Inpatient Hospital Stay (HOSPITAL_COMMUNITY)
Admission: RE | Admit: 2023-03-09 | Discharge: 2023-03-13 | DRG: 253 | Disposition: A | Discharge status: Home Health | Payer: PPO | Attending: Surgery | Admitting: Surgery

## 2023-03-09 DIAGNOSIS — D509 Iron deficiency anemia, unspecified: Secondary | ICD-10-CM | POA: Diagnosis not present

## 2023-03-09 DIAGNOSIS — T82898A Other specified complication of vascular prosthetic devices, implants and grafts, initial encounter: Principal | ICD-10-CM | POA: Diagnosis present

## 2023-03-09 DIAGNOSIS — K219 Gastro-esophageal reflux disease without esophagitis: Secondary | ICD-10-CM | POA: Diagnosis present

## 2023-03-09 DIAGNOSIS — Z7984 Long term (current) use of oral hypoglycemic drugs: Secondary | ICD-10-CM

## 2023-03-09 DIAGNOSIS — I10 Essential (primary) hypertension: Secondary | ICD-10-CM | POA: Diagnosis present

## 2023-03-09 DIAGNOSIS — E1151 Type 2 diabetes mellitus with diabetic peripheral angiopathy without gangrene: Principal | ICD-10-CM | POA: Diagnosis present

## 2023-03-09 DIAGNOSIS — I480 Paroxysmal atrial fibrillation: Secondary | ICD-10-CM | POA: Diagnosis not present

## 2023-03-09 DIAGNOSIS — I70201 Unspecified atherosclerosis of native arteries of extremities, right leg: Secondary | ICD-10-CM | POA: Diagnosis not present

## 2023-03-09 DIAGNOSIS — Z7982 Long term (current) use of aspirin: Secondary | ICD-10-CM

## 2023-03-09 DIAGNOSIS — I050 Rheumatic mitral stenosis: Secondary | ICD-10-CM | POA: Diagnosis not present

## 2023-03-09 DIAGNOSIS — E785 Hyperlipidemia, unspecified: Secondary | ICD-10-CM | POA: Diagnosis present

## 2023-03-09 DIAGNOSIS — Z87891 Personal history of nicotine dependence: Secondary | ICD-10-CM

## 2023-03-09 DIAGNOSIS — Z809 Family history of malignant neoplasm, unspecified: Secondary | ICD-10-CM

## 2023-03-09 DIAGNOSIS — Z7985 Long-term (current) use of injectable non-insulin antidiabetic drugs: Secondary | ICD-10-CM

## 2023-03-09 DIAGNOSIS — E039 Hypothyroidism, unspecified: Secondary | ICD-10-CM | POA: Diagnosis present

## 2023-03-09 DIAGNOSIS — Z83719 Family history of colon polyps, unspecified: Secondary | ICD-10-CM

## 2023-03-09 DIAGNOSIS — I70235 Atherosclerosis of native arteries of right leg with ulceration of other part of foot: Secondary | ICD-10-CM | POA: Diagnosis not present

## 2023-03-09 DIAGNOSIS — Z794 Long term (current) use of insulin: Secondary | ICD-10-CM | POA: Diagnosis not present

## 2023-03-09 DIAGNOSIS — L97529 Non-pressure chronic ulcer of other part of left foot with unspecified severity: Secondary | ICD-10-CM | POA: Diagnosis not present

## 2023-03-09 DIAGNOSIS — Z7901 Long term (current) use of anticoagulants: Secondary | ICD-10-CM

## 2023-03-09 DIAGNOSIS — Z880 Allergy status to penicillin: Secondary | ICD-10-CM

## 2023-03-09 DIAGNOSIS — Z9884 Bariatric surgery status: Secondary | ICD-10-CM | POA: Diagnosis not present

## 2023-03-09 DIAGNOSIS — H409 Unspecified glaucoma: Secondary | ICD-10-CM | POA: Diagnosis present

## 2023-03-09 DIAGNOSIS — L97519 Non-pressure chronic ulcer of other part of right foot with unspecified severity: Secondary | ICD-10-CM | POA: Diagnosis not present

## 2023-03-09 DIAGNOSIS — E11621 Type 2 diabetes mellitus with foot ulcer: Secondary | ICD-10-CM | POA: Diagnosis present

## 2023-03-09 DIAGNOSIS — Z79899 Other long term (current) drug therapy: Secondary | ICD-10-CM | POA: Diagnosis not present

## 2023-03-09 DIAGNOSIS — I70239 Atherosclerosis of native arteries of right leg with ulceration of unspecified site: Secondary | ICD-10-CM

## 2023-03-09 DIAGNOSIS — Z833 Family history of diabetes mellitus: Secondary | ICD-10-CM

## 2023-03-09 DIAGNOSIS — D62 Acute posthemorrhagic anemia: Secondary | ICD-10-CM | POA: Diagnosis not present

## 2023-03-09 DIAGNOSIS — Z888 Allergy status to other drugs, medicaments and biological substances status: Secondary | ICD-10-CM

## 2023-03-09 DIAGNOSIS — I4891 Unspecified atrial fibrillation: Secondary | ICD-10-CM | POA: Diagnosis not present

## 2023-03-09 DIAGNOSIS — I7025 Atherosclerosis of native arteries of other extremities with ulceration: Secondary | ICD-10-CM

## 2023-03-09 DIAGNOSIS — Z832 Family history of diseases of the blood and blood-forming organs and certain disorders involving the immune mechanism: Secondary | ICD-10-CM

## 2023-03-09 DIAGNOSIS — Z7902 Long term (current) use of antithrombotics/antiplatelets: Secondary | ICD-10-CM

## 2023-03-09 DIAGNOSIS — Z8261 Family history of arthritis: Secondary | ICD-10-CM

## 2023-03-09 DIAGNOSIS — I058 Other rheumatic mitral valve diseases: Secondary | ICD-10-CM | POA: Diagnosis present

## 2023-03-09 DIAGNOSIS — Z885 Allergy status to narcotic agent status: Secondary | ICD-10-CM | POA: Diagnosis not present

## 2023-03-09 DIAGNOSIS — Z800 Family history of malignant neoplasm of digestive organs: Secondary | ICD-10-CM

## 2023-03-09 DIAGNOSIS — Z823 Family history of stroke: Secondary | ICD-10-CM

## 2023-03-09 DIAGNOSIS — Z83438 Family history of other disorder of lipoprotein metabolism and other lipidemia: Secondary | ICD-10-CM

## 2023-03-09 DIAGNOSIS — Z8249 Family history of ischemic heart disease and other diseases of the circulatory system: Secondary | ICD-10-CM

## 2023-03-09 DIAGNOSIS — Z841 Family history of disorders of kidney and ureter: Secondary | ICD-10-CM

## 2023-03-09 DIAGNOSIS — I739 Peripheral vascular disease, unspecified: Secondary | ICD-10-CM | POA: Diagnosis present

## 2023-03-09 HISTORY — PX: PATCH ANGIOPLASTY: SHX6230

## 2023-03-09 HISTORY — PX: FEMORAL-TIBIAL BYPASS GRAFT: SHX938

## 2023-03-09 LAB — CBC
HCT: 35.2 % — ABNORMAL LOW (ref 36.0–46.0)
Hemoglobin: 10.7 g/dL — ABNORMAL LOW (ref 12.0–15.0)
MCH: 24.4 pg — ABNORMAL LOW (ref 26.0–34.0)
MCHC: 30.4 g/dL (ref 30.0–36.0)
MCV: 80.2 fL (ref 80.0–100.0)
Platelets: 284 10*3/uL (ref 150–400)
RBC: 4.39 MIL/uL (ref 3.87–5.11)
RDW: 17.2 % — ABNORMAL HIGH (ref 11.5–15.5)
WBC: 9.7 10*3/uL (ref 4.0–10.5)
nRBC: 0 % (ref 0.0–0.2)

## 2023-03-09 LAB — GLUCOSE, CAPILLARY
Glucose-Capillary: 93 mg/dL (ref 70–99)
Glucose-Capillary: 113 mg/dL — ABNORMAL HIGH (ref 70–99)
Glucose-Capillary: 120 mg/dL — ABNORMAL HIGH (ref 70–99)
Glucose-Capillary: 187 mg/dL — ABNORMAL HIGH (ref 70–99)

## 2023-03-09 LAB — COMPREHENSIVE METABOLIC PANEL
ALT: 19 U/L (ref 0–44)
AST: 19 U/L (ref 15–41)
Albumin: 3.4 g/dL — ABNORMAL LOW (ref 3.5–5.0)
Alkaline Phosphatase: 55 U/L (ref 38–126)
Anion gap: 7 (ref 5–15)
BUN: 10 mg/dL (ref 8–23)
CO2: 26 mmol/L (ref 22–32)
Calcium: 9.1 mg/dL (ref 8.9–10.3)
Chloride: 107 mmol/L (ref 98–111)
Creatinine, Ser: 0.81 mg/dL (ref 0.44–1.00)
GFR, Estimated: >60 mL/min (ref >60)
Glucose, Bld: 102 mg/dL — ABNORMAL HIGH (ref 70–99)
Potassium: 3.9 mmol/L (ref 3.5–5.1)
Sodium: 140 mmol/L (ref 135–145)
Total Bilirubin: 0.7 mg/dL (ref <1.2)
Total Protein: 6.4 g/dL — ABNORMAL LOW (ref 6.5–8.1)

## 2023-03-09 LAB — SURGICAL PCR SCREEN
MRSA, PCR: NEGATIVE
Staphylococcus aureus: NEGATIVE

## 2023-03-09 LAB — PROTIME-INR
INR: 1 (ref 0.8–1.2)
Prothrombin Time: 13.8 s (ref 11.4–15.2)

## 2023-03-09 LAB — PREPARE RBC (CROSSMATCH)

## 2023-03-09 LAB — APTT: aPTT: 26 s (ref 24–36)

## 2023-03-09 SURGERY — CREATION, BYPASS, ARTERIAL, FEMORAL TO TIBIAL, USING GRAFT
Anesthesia: General | Site: Leg Upper | Laterality: Right

## 2023-03-09 MED ORDER — LIDOCAINE 2% (20 MG/ML) 5 ML SYRINGE
INTRAMUSCULAR | Status: DC | PRN
  Administered 2023-03-09: 100 mg via INTRAVENOUS

## 2023-03-09 MED ORDER — BISACODYL 5 MG PO TBEC: 5.0000 mg | DELAYED_RELEASE_TABLET | Freq: Every day | ORAL | Status: DC | PRN

## 2023-03-09 MED ORDER — HEPARIN SODIUM (PORCINE) 5000 UNIT/ML IJ SOLN
5000.0000 [IU] | Freq: Three times a day (TID) | INTRAMUSCULAR | Status: AC
  Administered 2023-03-10 – 2023-03-11 (×5): 5000 [IU] via SUBCUTANEOUS
  Filled 2023-03-09 (×5): qty 1

## 2023-03-09 MED ORDER — SODIUM CHLORIDE 0.9 % IV SOLN: INTRAVENOUS | Status: DC

## 2023-03-09 MED ORDER — ALBUMIN HUMAN 5 % IV SOLN: INTRAVENOUS | Status: DC | PRN

## 2023-03-09 MED ORDER — EZETIMIBE 10 MG PO TABS
10.0000 mg | ORAL_TABLET | Freq: Every day | ORAL | Status: DC
  Administered 2023-03-09 – 2023-03-13 (×5): 10 mg via ORAL
  Filled 2023-03-09 (×5): qty 1

## 2023-03-09 MED ORDER — CEFAZOLIN SODIUM-DEXTROSE 2-4 GM/100ML-% IV SOLN
2.0000 g | Freq: Three times a day (TID) | INTRAVENOUS | Status: AC
  Administered 2023-03-09 – 2023-03-10 (×2): 2 g via INTRAVENOUS
  Filled 2023-03-09 (×2): qty 100

## 2023-03-09 MED ORDER — LACTATED RINGERS IV SOLN: INTRAVENOUS | Status: DC | PRN

## 2023-03-09 MED ORDER — PHENYLEPHRINE HCL-NACL 20-0.9 MG/250ML-% IV SOLN
INTRAVENOUS | Status: DC | PRN
  Administered 2023-03-09: 40 ug/min via INTRAVENOUS

## 2023-03-09 MED ORDER — SODIUM CHLORIDE 0.9 % IV SOLN: INTRAVENOUS | Status: AC

## 2023-03-09 MED ORDER — 0.9 % SODIUM CHLORIDE (POUR BTL) OPTIME
TOPICAL | Status: DC | PRN
  Administered 2023-03-09: 2000 mL

## 2023-03-09 MED ORDER — PROPOFOL 10 MG/ML IV BOLUS
INTRAVENOUS | Status: DC | PRN
  Administered 2023-03-09: 120 mg via INTRAVENOUS
  Administered 2023-03-09: 20 mg via INTRAVENOUS

## 2023-03-09 MED ORDER — EMPAGLIFLOZIN-METFORMIN HCL 5-500 MG PO TABS: 1.0000 | ORAL_TABLET | Freq: Two times a day (BID) | ORAL | Status: DC

## 2023-03-09 MED ORDER — ONDANSETRON HCL 4 MG/2ML IJ SOLN
INTRAMUSCULAR | Status: AC
  Filled 2023-03-09: qty 2

## 2023-03-09 MED ORDER — HYDRALAZINE HCL 20 MG/ML IJ SOLN: 5.0000 mg | INTRAMUSCULAR | Status: DC | PRN

## 2023-03-09 MED ORDER — ROCURONIUM BROMIDE 10 MG/ML (PF) SYRINGE
PREFILLED_SYRINGE | INTRAVENOUS | Status: DC | PRN
  Administered 2023-03-09: 20 mg via INTRAVENOUS
  Administered 2023-03-09: 100 mg via INTRAVENOUS
  Administered 2023-03-09: 50 mg via INTRAVENOUS

## 2023-03-09 MED ORDER — DOCUSATE SODIUM 100 MG PO CAPS
100.0000 mg | ORAL_CAPSULE | Freq: Every day | ORAL | Status: DC
  Administered 2023-03-10 – 2023-03-13 (×4): 100 mg via ORAL
  Filled 2023-03-09 (×4): qty 1

## 2023-03-09 MED ORDER — DILTIAZEM HCL 30 MG PO TABS: 30.0000 mg | ORAL_TABLET | ORAL | Status: DC | PRN

## 2023-03-09 MED ORDER — CHLORHEXIDINE GLUCONATE 0.12 % MT SOLN
15.0000 mL | Freq: Once | OROMUCOSAL | Status: AC
  Administered 2023-03-09: 15 mL via OROMUCOSAL
  Filled 2023-03-09: qty 15

## 2023-03-09 MED ORDER — MIDAZOLAM HCL 2 MG/2ML IJ SOLN
INTRAMUSCULAR | Status: DC | PRN
  Administered 2023-03-09: 2 mg via INTRAVENOUS

## 2023-03-09 MED ORDER — ROCURONIUM BROMIDE 10 MG/ML (PF) SYRINGE
PREFILLED_SYRINGE | INTRAVENOUS | Status: AC
  Filled 2023-03-09: qty 10

## 2023-03-09 MED ORDER — DIPHENHYDRAMINE HCL 50 MG/ML IJ SOLN
50.0000 mg | Freq: Three times a day (TID) | INTRAMUSCULAR | Status: AC | PRN
Start: 2023-03-09 — End: 2023-03-10
  Administered 2023-03-09 – 2023-03-10 (×2): 50 mg via INTRAVENOUS
  Filled 2023-03-09 (×2): qty 1

## 2023-03-09 MED ORDER — HEPARIN 6000 UNIT IRRIGATION SOLUTION
Status: DC | PRN
  Administered 2023-03-09: 1

## 2023-03-09 MED ORDER — OXYCODONE HCL 5 MG PO TABS
5.0000 mg | ORAL_TABLET | ORAL | Status: DC | PRN
  Administered 2023-03-10 – 2023-03-13 (×7): 10 mg via ORAL
  Filled 2023-03-09 (×7): qty 2

## 2023-03-09 MED ORDER — PROTAMINE SULFATE 10 MG/ML IV SOLN
INTRAVENOUS | Status: DC | PRN
  Administered 2023-03-09: 50 mg via INTRAVENOUS

## 2023-03-09 MED ORDER — DEXAMETHASONE SODIUM PHOSPHATE 10 MG/ML IJ SOLN
INTRAMUSCULAR | Status: DC | PRN
  Administered 2023-03-09: 5 mg via INTRAVENOUS

## 2023-03-09 MED ORDER — NEBIVOLOL HCL 10 MG PO TABS
10.0000 mg | ORAL_TABLET | Freq: Every day | ORAL | Status: DC
  Administered 2023-03-10 – 2023-03-13 (×4): 10 mg via ORAL
  Filled 2023-03-09 (×4): qty 1

## 2023-03-09 MED ORDER — DEXAMETHASONE SODIUM PHOSPHATE 10 MG/ML IJ SOLN
INTRAMUSCULAR | Status: AC
  Filled 2023-03-09: qty 1

## 2023-03-09 MED ORDER — SUGAMMADEX SODIUM 200 MG/2ML IV SOLN
INTRAVENOUS | Status: DC | PRN
  Administered 2023-03-09: 200 mg via INTRAVENOUS

## 2023-03-09 MED ORDER — EPHEDRINE SULFATE-NACL 50-0.9 MG/10ML-% IV SOSY
PREFILLED_SYRINGE | INTRAVENOUS | Status: DC | PRN
  Administered 2023-03-09: 5 mg via INTRAVENOUS
  Administered 2023-03-09 (×3): 2.5 mg via INTRAVENOUS

## 2023-03-09 MED ORDER — HEPARIN SODIUM (PORCINE) 1000 UNIT/ML IJ SOLN
INTRAMUSCULAR | Status: DC | PRN
  Administered 2023-03-09: 8000 [IU] via INTRAVENOUS
  Administered 2023-03-09: 3000 [IU] via INTRAVENOUS
  Administered 2023-03-09: 2000 [IU] via INTRAVENOUS

## 2023-03-09 MED ORDER — ALUM & MAG HYDROXIDE-SIMETH 200-200-20 MG/5ML PO SUSP
15.0000 mL | ORAL | Status: DC | PRN
Start: 2023-03-09 — End: 2023-03-13

## 2023-03-09 MED ORDER — SENNOSIDES-DOCUSATE SODIUM 8.6-50 MG PO TABS: 1.0000 | ORAL_TABLET | Freq: Every evening | ORAL | Status: DC | PRN

## 2023-03-09 MED ORDER — PROTAMINE SULFATE 10 MG/ML IV SOLN
INTRAVENOUS | Status: AC
  Filled 2023-03-09: qty 5

## 2023-03-09 MED ORDER — PANTOPRAZOLE SODIUM 40 MG PO TBEC: 40.0000 mg | DELAYED_RELEASE_TABLET | Freq: Every day | ORAL | Status: DC

## 2023-03-09 MED ORDER — ACETAMINOPHEN 650 MG RE SUPP: 325.0000 mg | RECTAL | Status: DC | PRN

## 2023-03-09 MED ORDER — MAGNESIUM OXIDE -MG SUPPLEMENT 400 (240 MG) MG PO TABS
400.0000 mg | ORAL_TABLET | Freq: Every day | ORAL | Status: DC
  Administered 2023-03-09 – 2023-03-13 (×5): 400 mg via ORAL
  Filled 2023-03-09 (×5): qty 1

## 2023-03-09 MED ORDER — DOXYCYCLINE HYCLATE 50 MG PO CAPS
50.0000 mg | ORAL_CAPSULE | Freq: Two times a day (BID) | ORAL | Status: DC
  Administered 2023-03-09 – 2023-03-13 (×8): 50 mg via ORAL
  Filled 2023-03-09 (×9): qty 1

## 2023-03-09 MED ORDER — FENTANYL CITRATE (PF) 250 MCG/5ML IJ SOLN
INTRAMUSCULAR | Status: DC | PRN
  Administered 2023-03-09 (×2): 25 ug via INTRAVENOUS
  Administered 2023-03-09: 50 ug via INTRAVENOUS
  Administered 2023-03-09: 100 ug via INTRAVENOUS
  Administered 2023-03-09: 50 ug via INTRAVENOUS

## 2023-03-09 MED ORDER — LORATADINE 10 MG PO TABS
10.0000 mg | ORAL_TABLET | Freq: Every day | ORAL | Status: DC
  Administered 2023-03-11 – 2023-03-13 (×3): 10 mg via ORAL
  Filled 2023-03-09 (×4): qty 1

## 2023-03-09 MED ORDER — INSULIN ASPART 100 UNIT/ML IJ SOLN
14.0000 [IU] | Freq: Three times a day (TID) | INTRAMUSCULAR | Status: DC
  Administered 2023-03-10 – 2023-03-13 (×2): 14 [IU] via SUBCUTANEOUS

## 2023-03-09 MED ORDER — CHLORHEXIDINE GLUCONATE CLOTH 2 % EX PADS: 6.0000 | MEDICATED_PAD | Freq: Once | CUTANEOUS | Status: DC

## 2023-03-09 MED ORDER — GUAIFENESIN-DM 100-10 MG/5ML PO SYRP: 15.0000 mL | ORAL_SOLUTION | ORAL | Status: DC | PRN

## 2023-03-09 MED ORDER — SODIUM CHLORIDE 0.9 % IV SOLN: 500.0000 mL | Freq: Once | INTRAVENOUS | Status: DC | PRN

## 2023-03-09 MED ORDER — HYDROMORPHONE HCL 1 MG/ML IJ SOLN
0.5000 mg | INTRAMUSCULAR | Status: DC | PRN
Start: 2023-03-09 — End: 2023-03-13
  Administered 2023-03-09 – 2023-03-10 (×3): 0.5 mg via INTRAVENOUS
  Filled 2023-03-09 (×3): qty 0.5

## 2023-03-09 MED ORDER — ROSUVASTATIN CALCIUM 5 MG PO TABS
5.0000 mg | ORAL_TABLET | Freq: Every day | ORAL | Status: DC
  Administered 2023-03-10 – 2023-03-13 (×4): 5 mg via ORAL
  Filled 2023-03-09 (×4): qty 1

## 2023-03-09 MED ORDER — INSULIN GLARGINE-YFGN 100 UNIT/ML ~~LOC~~ SOLN
60.0000 [IU] | Freq: Every day | SUBCUTANEOUS | Status: DC
  Administered 2023-03-10 – 2023-03-13 (×3): 60 [IU] via SUBCUTANEOUS
  Filled 2023-03-09 (×4): qty 0.6

## 2023-03-09 MED ORDER — DEXMEDETOMIDINE HCL IN NACL 80 MCG/20ML IV SOLN
INTRAVENOUS | Status: AC
  Filled 2023-03-09: qty 20

## 2023-03-09 MED ORDER — HEPARIN SODIUM (PORCINE) 1000 UNIT/ML IJ SOLN
INTRAMUSCULAR | Status: AC
  Filled 2023-03-09: qty 10

## 2023-03-09 MED ORDER — PHENOL 1.4 % MT LIQD: 1.0000 | OROMUCOSAL | Status: DC | PRN

## 2023-03-09 MED ORDER — PHENYLEPHRINE HCL (PRESSORS) 10 MG/ML IV SOLN
INTRAVENOUS | Status: DC | PRN
  Administered 2023-03-09 (×2): 80 ug via INTRAVENOUS

## 2023-03-09 MED ORDER — PANTOPRAZOLE SODIUM 40 MG PO TBEC
40.0000 mg | DELAYED_RELEASE_TABLET | Freq: Every day | ORAL | Status: DC
  Administered 2023-03-10 – 2023-03-13 (×4): 40 mg via ORAL
  Filled 2023-03-09 (×4): qty 1

## 2023-03-09 MED ORDER — VANCOMYCIN HCL IN DEXTROSE 1-5 GM/200ML-% IV SOLN
1000.0000 mg | INTRAVENOUS | Status: AC
  Administered 2023-03-09: 1000 mg via INTRAVENOUS
  Filled 2023-03-09: qty 200

## 2023-03-09 MED ORDER — DOXYCYCLINE MONOHYDRATE 100 MG PO TABS
50.0000 mg | ORAL_TABLET | Freq: Two times a day (BID) | ORAL | Status: DC
  Filled 2023-03-09: qty 0.5

## 2023-03-09 MED ORDER — ORAL CARE MOUTH RINSE: 15.0000 mL | Freq: Once | OROMUCOSAL | Status: AC

## 2023-03-09 MED ORDER — IODIXANOL 320 MG/ML IV SOLN
INTRAVENOUS | Status: DC | PRN
  Administered 2023-03-09: 50 mL via INTRA_ARTERIAL

## 2023-03-09 MED ORDER — PROPOFOL 10 MG/ML IV BOLUS
INTRAVENOUS | Status: AC
  Filled 2023-03-09: qty 20

## 2023-03-09 MED ORDER — ASPIRIN 81 MG PO TBEC
81.0000 mg | DELAYED_RELEASE_TABLET | Freq: Every day | ORAL | Status: DC
  Administered 2023-03-10 – 2023-03-13 (×4): 81 mg via ORAL
  Filled 2023-03-09 (×4): qty 1

## 2023-03-09 MED ORDER — LABETALOL HCL 5 MG/ML IV SOLN: 10.0000 mg | INTRAVENOUS | Status: DC | PRN

## 2023-03-09 MED ORDER — METOPROLOL TARTRATE 5 MG/5ML IV SOLN: 2.0000 mg | INTRAVENOUS | Status: DC | PRN

## 2023-03-09 MED ORDER — HYDRALAZINE HCL 50 MG PO TABS
50.0000 mg | ORAL_TABLET | Freq: Three times a day (TID) | ORAL | Status: DC
  Administered 2023-03-09 – 2023-03-13 (×11): 50 mg via ORAL
  Filled 2023-03-09 (×11): qty 1

## 2023-03-09 MED ORDER — HEPARIN SODIUM (PORCINE) 1000 UNIT/ML IJ SOLN
INTRAMUSCULAR | Status: AC
  Filled 2023-03-09: qty 1

## 2023-03-09 MED ORDER — CLOPIDOGREL BISULFATE 75 MG PO TABS
75.0000 mg | ORAL_TABLET | Freq: Every day | ORAL | Status: DC
  Administered 2023-03-10 – 2023-03-13 (×4): 75 mg via ORAL
  Filled 2023-03-09 (×4): qty 1

## 2023-03-09 MED ORDER — MIDAZOLAM HCL 2 MG/2ML IJ SOLN
INTRAMUSCULAR | Status: AC
  Filled 2023-03-09: qty 2

## 2023-03-09 MED ORDER — SEMAGLUTIDE (1 MG/DOSE) 4 MG/3ML ~~LOC~~ SOPN: 1.0000 mg | PEN_INJECTOR | SUBCUTANEOUS | Status: DC

## 2023-03-09 MED ORDER — SODIUM CHLORIDE 0.9 % IV SOLN: INTRAVENOUS | Status: DC | PRN

## 2023-03-09 MED ORDER — DIPHENHYDRAMINE HCL 50 MG/ML IJ SOLN
INTRAMUSCULAR | Status: AC
  Administered 2023-03-09: 12.5 mg via INTRAVENOUS
  Filled 2023-03-09: qty 1

## 2023-03-09 MED ORDER — LACTATED RINGERS IV SOLN
INTRAVENOUS | Status: DC
Start: 2023-03-09 — End: 2023-03-09

## 2023-03-09 MED ORDER — DORZOLAMIDE HCL-TIMOLOL MAL 2-0.5 % OP SOLN
1.0000 [drp] | Freq: Two times a day (BID) | OPHTHALMIC | Status: DC
  Administered 2023-03-09 – 2023-03-13 (×8): 1 [drp] via OPHTHALMIC
  Filled 2023-03-09: qty 10

## 2023-03-09 MED ORDER — LATANOPROST 0.005 % OP SOLN
1.0000 [drp] | Freq: Every day | OPHTHALMIC | Status: DC
  Administered 2023-03-09 – 2023-03-12 (×4): 1 [drp] via OPHTHALMIC
  Filled 2023-03-09: qty 2.5

## 2023-03-09 MED ORDER — DIPHENHYDRAMINE HCL 50 MG/ML IJ SOLN: 12.5000 mg | Freq: Once | INTRAMUSCULAR | Status: AC

## 2023-03-09 MED ORDER — ONDANSETRON HCL 4 MG/2ML IJ SOLN
INTRAMUSCULAR | Status: DC | PRN
  Administered 2023-03-09: 4 mg via INTRAVENOUS

## 2023-03-09 MED ORDER — FENTANYL CITRATE (PF) 250 MCG/5ML IJ SOLN
INTRAMUSCULAR | Status: AC
  Filled 2023-03-09: qty 5

## 2023-03-09 MED ORDER — ONDANSETRON HCL 4 MG/2ML IJ SOLN
4.0000 mg | Freq: Four times a day (QID) | INTRAMUSCULAR | Status: DC | PRN
  Administered 2023-03-10 – 2023-03-13 (×7): 4 mg via INTRAVENOUS
  Filled 2023-03-09 (×8): qty 2

## 2023-03-09 MED ORDER — HEPARIN SODIUM (PORCINE) 1000 UNIT/ML IJ SOLN
INTRAMUSCULAR | Status: AC
  Filled 2023-03-09: qty 30

## 2023-03-09 MED ORDER — ACETAMINOPHEN 325 MG PO TABS: 325.0000 mg | ORAL_TABLET | ORAL | Status: DC | PRN

## 2023-03-09 MED ORDER — INSULIN ASPART 100 UNIT/ML IJ SOLN: 0.0000 [IU] | INTRAMUSCULAR | Status: DC | PRN

## 2023-03-09 MED ORDER — DILTIAZEM HCL ER COATED BEADS 180 MG PO CP24
180.0000 mg | ORAL_CAPSULE | Freq: Every day | ORAL | Status: DC
  Administered 2023-03-10 – 2023-03-13 (×4): 180 mg via ORAL
  Filled 2023-03-09 (×4): qty 1

## 2023-03-09 MED ORDER — MAGNESIUM SULFATE 2 GM/50ML IV SOLN: 2.0000 g | Freq: Every day | INTRAVENOUS | Status: DC | PRN

## 2023-03-09 MED ORDER — POTASSIUM CHLORIDE CRYS ER 20 MEQ PO TBCR: 20.0000 meq | EXTENDED_RELEASE_TABLET | Freq: Every day | ORAL | Status: DC | PRN

## 2023-03-09 SURGICAL SUPPLY — 66 items
ADH SKN CLS APL DERMABOND .7 (GAUZE/BANDAGES/DRESSINGS) ×4
AGENT HMST KT MTR STRL THRMB (HEMOSTASIS)
BAG COUNTER SPONGE SURGICOUNT (BAG) ×3 IMPLANT
BAG SPNG CNTER NS LX DISP (BAG) ×2
BANDAGE ESMARK 6X9 LF (GAUZE/BANDAGES/DRESSINGS) IMPLANT
BNDG CMPR 9X6 STRL LF SNTH (GAUZE/BANDAGES/DRESSINGS) ×2
BNDG ESMARK 6X9 LF (GAUZE/BANDAGES/DRESSINGS) ×2
CANISTER SUCT 3000ML PPV (MISCELLANEOUS) ×3 IMPLANT
CANNULA VESSEL 3MM 2 BLNT TIP (CANNULA) ×3 IMPLANT
CATH EMB 3FR 40 (CATHETERS) IMPLANT
CATH EMB 4FR 40 (CATHETERS) IMPLANT
CLIP TI MEDIUM 24 (CLIP) ×3 IMPLANT
CLIP TI WIDE RED SMALL 24 (CLIP) ×3 IMPLANT
COVER PROBE W GEL 5X96 (DRAPES) ×3 IMPLANT
CUFF TOURN SGL QUICK 24 (TOURNIQUET CUFF) ×2
CUFF TOURN SGL QUICK 34 (TOURNIQUET CUFF)
CUFF TOURN SGL QUICK 42 (TOURNIQUET CUFF) IMPLANT
CUFF TRNQT CYL 24X4X16.5-23 (TOURNIQUET CUFF) IMPLANT
CUFF TRNQT CYL 34X4.125X (TOURNIQUET CUFF) IMPLANT
DERMABOND ADVANCED .7 DNX12 (GAUZE/BANDAGES/DRESSINGS) ×3 IMPLANT
DRAIN CHANNEL 15F RND FF W/TCR (WOUND CARE) IMPLANT
DRAPE C-ARM 42X72 X-RAY (DRAPES) IMPLANT
DRAPE HALF SHEET 40X57 (DRAPES) IMPLANT
DRAPE X-RAY CASS 24X20 (DRAPES) IMPLANT
ELECT REM PT RETURN 9FT ADLT (ELECTROSURGICAL) ×2
ELECTRODE REM PT RTRN 9FT ADLT (ELECTROSURGICAL) ×3 IMPLANT
EVACUATOR SILICONE 100CC (DRAIN) IMPLANT
GAUZE 4X4 16PLY ~~LOC~~+RFID DBL (SPONGE) IMPLANT
GLOVE SURG SS PI 7.5 STRL IVOR (GLOVE) ×9 IMPLANT
GOWN STRL REUS W/ TWL LRG LVL3 (GOWN DISPOSABLE) ×6 IMPLANT
GOWN STRL REUS W/ TWL XL LVL3 (GOWN DISPOSABLE) ×3 IMPLANT
GOWN STRL REUS W/TWL LRG LVL3 (GOWN DISPOSABLE) ×4
GOWN STRL REUS W/TWL XL LVL3 (GOWN DISPOSABLE) ×2
GRAFT PROPATEN W/RING 6X80X60 (Vascular Products) IMPLANT
HEMOSTAT SNOW SURGICEL 2X4 (HEMOSTASIS) IMPLANT
KIT BASIN OR (CUSTOM PROCEDURE TRAY) ×3 IMPLANT
KIT TURNOVER KIT B (KITS) ×3 IMPLANT
MARKER GRAFT CORONARY BYPASS (MISCELLANEOUS) IMPLANT
NS IRRIG 1000ML POUR BTL (IV SOLUTION) ×6 IMPLANT
PACK PERIPHERAL VASCULAR (CUSTOM PROCEDURE TRAY) ×3 IMPLANT
PAD ARMBOARD 7.5X6 YLW CONV (MISCELLANEOUS) ×6 IMPLANT
SET COLLECT BLD 21X3/4 12 (NEEDLE) IMPLANT
STOPCOCK 4 WAY LG BORE MALE ST (IV SETS) IMPLANT
SURGIFLO W/THROMBIN 8M KIT (HEMOSTASIS) IMPLANT
SUT ETHILON 3 0 PS 1 (SUTURE) IMPLANT
SUT PROLENE 5 0 C 1 24 (SUTURE) ×3 IMPLANT
SUT PROLENE 6 0 BV (SUTURE) ×3 IMPLANT
SUT PROLENE 7 0 BV 1 (SUTURE) IMPLANT
SUT SILK 2 0 (SUTURE) ×2
SUT SILK 2 0 SH (SUTURE) ×3 IMPLANT
SUT SILK 2-0 18XBRD TIE 12 (SUTURE) IMPLANT
SUT SILK 3 0 (SUTURE) ×2
SUT SILK 3-0 18XBRD TIE 12 (SUTURE) IMPLANT
SUT VIC AB 2-0 CT1 27 (SUTURE) ×4
SUT VIC AB 2-0 CT1 TAPERPNT 27 (SUTURE) ×6 IMPLANT
SUT VIC AB 3-0 SH 27 (SUTURE) ×8
SUT VIC AB 3-0 SH 27X BRD (SUTURE) ×6 IMPLANT
SUT VIC AB 4-0 PS2 18 (SUTURE) IMPLANT
SUT VICRYL 4-0 PS2 18IN ABS (SUTURE) ×6 IMPLANT
SYR 10ML LL (SYRINGE) IMPLANT
SYR 20CC LL (SYRINGE) IMPLANT
TAPE UMBILICAL 1/8X30 (MISCELLANEOUS) IMPLANT
TOWEL GREEN STERILE (TOWEL DISPOSABLE) ×3 IMPLANT
TUBING EXTENTION W/L.L. (IV SETS) IMPLANT
UNDERPAD 30X36 HEAVY ABSORB (UNDERPADS AND DIAPERS) ×3 IMPLANT
WATER STERILE IRR 1000ML POUR (IV SOLUTION) ×3 IMPLANT

## 2023-03-09 NOTE — H&P (Signed)
[facility]  Patient name: Denya Brincefield MRN: [id number] DOB: [birth year] Sex: female    HISOTRY OF PRESENT ILLNESS:    Latisa Gura is a 67 y.o. female with a right great toe wound.  She has severe PAD.  Angio shows SFA/Pop oclusion.  SHe is here today for surgical revascularization   PAST MEDICAL HISTORY:   Past Medical History:  Diagnosis Date   Allergy    Anxiety    Pt. denies   Arthritis    right index figer   Asymptomatic cholelithiasis    Atherosclerosis of aorta (HCC)    Cataract    Clotting disorder (HCC)    Gallstones 06/2013   GERD (gastroesophageal reflux disease)    Glaucoma    "A LITTLE BIT"   History of kidney stones    lithotrispy   Hyperlipidemia    Hypertension    Hypothyroidism    no meds   Iron deficiency anemia    PAD (peripheral artery disease) (HCC)    PAF (paroxysmal atrial fibrillation) (HCC)    Peripheral arterial occlusive disease (HCC)    lower extremities   Pneumonia    PONV (postoperative nausea and vomiting)    Right ureteral stone    Type 2 diabetes mellitus (HCC)    Type   Vitamin B 12 deficiency    Wears glasses      FAMILY HISTORY:   Family History  Problem Relation Age of Onset   Kidney disease Mother    Hyperlipidemia Mother    Other Mother        AAA   and    Amputation   Diabetes Father    Heart disease Father    Deep vein thrombosis Father    Hyperlipidemia Father    Diabetes Sister    Heart disease Sister    Deep vein thrombosis Sister    Hyperlipidemia Sister    Diabetes Brother    Heart disease Brother    Cancer Brother    Hyperlipidemia Brother    Diabetes Brother    Diabetes Brother    Colon polyps Brother    Colon cancer Maternal Aunt    Arthritis Other    Cancer Other        colon   Hypertension Other    Stroke Other    Esophageal cancer Neg Hx    Stomach cancer Neg Hx    Inflammatory bowel disease Neg Hx    Liver disease Neg Hx     Pancreatic cancer Neg Hx    Crohn's disease Neg Hx    Rectal cancer Neg Hx     SOCIAL HISTORY:   Social History   Tobacco Use   Smoking status: Former    Current packs/day: 0.00    Average packs/day: 1 pack/day for 28.0 years (28.0 ttl pk-yrs)    Types: Cigarettes    Start date: 09/05/1973    Quit date: 09/05/2001    Years since quitting: 21.5    Passive exposure: Never   Smokeless tobacco: Never  Substance Use Topics   Alcohol use: No    Alcohol/week: 0.0 standard drinks of alcohol     ALLERGIES:   Allergies  Allergen Reactions   Amlodipine Swelling   Atorvastatin Other (See Comments)    Muscle aches    Ampicillin Nausea And Vomiting and Other (See Comments)   Codeine Nausea And Vomiting    Ask patient   Lisinopril Cough        Penicillins  Nausea And Vomiting    Did it involve swelling of the face/tongue/throat, SOB, or low BP? No Did it involve sudden or severe rash/hives, skin peeling, or any reaction on the inside of your mouth or nose? No Did you need to seek medical attention at a hospital or doctor's office? No When did it last happen?      20+ years If all above answers are "NO", may proceed with cephalosporin use.    Januvia [Sitagliptin] Nausea And Vomiting     CURRENT MEDICATIONS:   Current Facility-Administered Medications  Medication Dose Route Frequency Provider Last Rate Last Admin   0.9 %  sodium chloride infusion   Intravenous Continuous Nada Libman, MD       chlorhexidine (PERIDEX) 0.12 % solution 15 mL  15 mL Mouth/Throat Once Mariann Barter, MD       Or   Oral care mouth rinse  15 mL Mouth Rinse Once Mariann Barter, MD       Chlorhexidine Gluconate Cloth 2 % PADS 6 each  6 each Topical Once Nada Libman, MD       And   Chlorhexidine Gluconate Cloth 2 % PADS 6 each  6 each Topical Once Nada Libman, MD       lactated ringers infusion   Intravenous Continuous Mariann Barter, MD       vancomycin (VANCOCIN) IVPB 1000 mg/200 mL  premix  1,000 mg Intravenous 60 min Pre-Op Nada Libman, MD        REVIEW OF SYSTEMS:   [X]  denotes positive finding, [ ]  denotes negative finding Cardiac  Comments:  Chest pain or chest pressure:    Shortness of breath upon exertion:    Short of breath when lying flat:    Irregular heart rhythm:        Vascular    Pain in calf, thigh, or hip brought on by ambulation:    Pain in feet at night that wakes you up from your sleep:     Blood clot in your veins:    Leg swelling:         Pulmonary    Oxygen at home:    Productive cough:     Wheezing:         Neurologic    Sudden weakness in arms or legs:     Sudden numbness in arms or legs:     Sudden onset of difficulty speaking or slurred speech:    Temporary loss of vision in one eye:     Problems with dizziness:         Gastrointestinal    Blood in stool:     Vomited blood:         Genitourinary    Burning when urinating:     Blood in urine:        Psychiatric    Major depression:         Hematologic    Bleeding problems:    Problems with blood clotting too easily:        Skin    Rashes or ulcers: x       Constitutional    Fever or chills:      PHYSICAL EXAM:   There were no vitals filed for this visit.  GENERAL: The patient is a well-nourished female, in no acute distress. The vital signs are documented above. CARDIAC: There is a regular rate and rhythm.  VASCULAR: faintly palpable right femoral pulse, non-palpable  pedal pulses PULMONARY: Non-labored respirations ABDOMEN: Soft and non-tender with normal pitched bowel sounds.  MUSCULOSKELETAL: There are no major deformities or cyanosis. NEUROLOGIC: No focal weakness or paresthesias are detected. SKIN: There are no ulcers or rashes noted. PSYCHIATRIC: The patient has a normal affect.  STUDIES:     MEDICAL ISSUES:   Right great toe wound.  Plan for femoral endarterectomy and PT bypass, likely with composite graft. All questions  answered    Charlena Cross, MD, FACS [facility] [phone] Pager [phone]

## 2023-03-09 NOTE — Anesthesia Procedure Notes (Addendum)
Procedure Name: Intubation Date/Time: 03/09/2023 1:21 PM  Performed by: Sharyn Dross, CRNAPre-anesthesia Checklist: Patient identified, Emergency Drugs available, Suction available and Patient being monitored Patient Re-evaluated:Patient Re-evaluated prior to induction Oxygen Delivery Method: Circle system utilized Preoxygenation: Pre-oxygenation with 100% oxygen Induction Type: IV induction Ventilation: Mask ventilation without difficulty Laryngoscope Size: Miller and 2 Grade View: Grade I Tube type: Oral Tube size: 7.0 mm Number of attempts: 1 Airway Equipment and Method: Stylet and Bite block Placement Confirmation: ETT inserted through vocal cords under direct vision, positive ETCO2 and breath sounds checked- equal and bilateral Secured at: 21 cm Tube secured with: Tape Dental Injury: Teeth and Oropharynx as per pre-operative assessment

## 2023-03-09 NOTE — Progress Notes (Signed)
Patient received from PACU via bed. Pt ao x4. Breathing even and unlabored in RA. CHG bath completed, connected to tele and CCMD notified. Oriented pt to room and call bell system. Call bell within reach. Family in the room. Plan of care continues.

## 2023-03-09 NOTE — Transfer of Care (Signed)
Immediate Anesthesia Transfer of Care Note  Patient: Theresa Harrington  Procedure(s) Performed: RIGHT FEMORAL-POSTERIOR TIBIAL ARTERY BYPASS USING X 80CM PROPATEN GRAFT (Right: Leg Upper) VEIN PATCH ANGIOPLASTY TO RIGHT FEMORAL ARTERY (Right: Groin)  Patient Location: PACU  Anesthesia Type:General  Level of Consciousness: awake and drowsy  Airway & Oxygen Therapy: Patient Spontanous Breathing and Patient connected to face mask  Post-op Assessment: Report given to RN and Post -op Vital signs reviewed and stable  Post vital signs: Reviewed and stable  Last Vitals:  Vitals Value Taken Time  BP 131/53 03/09/23 1838  Temp 36.4 C 03/09/23 1838  Pulse 54 03/09/23 1844  Resp 18 03/09/23 1844  SpO2 95 % 03/09/23 1844  Vitals shown include unfiled device data.  Last Pain:  Vitals:   03/09/23 1838  TempSrc:   PainSc: Asleep         Complications: There were no known notable events for this encounter.

## 2023-03-09 NOTE — Progress Notes (Signed)
Pt was about to receive Ancef, pt said she got really bad head itch after antibiotic in preup before surgery this morning and had to get IV benadryl. On cal MD DR. Hetty Blend called and notified. Received order for prn iv benadryl x 2 doses prior to ancef. Plan of care continues.

## 2023-03-09 NOTE — Anesthesia Postprocedure Evaluation (Signed)
Anesthesia Post Note  Patient: Theresa Harrington  Procedure(s) Performed: RIGHT FEMORAL-POSTERIOR TIBIAL ARTERY BYPASS USING X 80CM PROPATEN GRAFT (Right: Leg Upper) VEIN PATCH ANGIOPLASTY TO RIGHT FEMORAL ARTERY (Right: Groin)     Patient location during evaluation: PACU Anesthesia Type: General Level of consciousness: awake and alert Pain management: pain level controlled Vital Signs Assessment: post-procedure vital signs reviewed and stable Respiratory status: spontaneous breathing, nonlabored ventilation, respiratory function stable and patient connected to nasal cannula oxygen Cardiovascular status: blood pressure returned to baseline and stable Postop Assessment: no apparent nausea or vomiting Anesthetic complications: no   There were no known notable events for this encounter.  Last Vitals:  Vitals:   03/09/23 1930 03/09/23 1955  BP: (!) 141/63 (!) 147/64  Pulse: (!) 57 (!) 58  Resp: 17 19  Temp: 36.7 C (!) 36.4 C  SpO2: 94% 96%    Last Pain:  Vitals:   03/09/23 1955  TempSrc: Oral  PainSc:                   [place]

## 2023-03-09 NOTE — Op Note (Signed)
Patient name: Theresa Harrington MRN: [id number] DOB: 1955-11-27 Sex: female  03/09/2023 Pre-operative Diagnosis: Right great toe ulcer Post-operative diagnosis:  Same Surgeon:  Durene Cal Assistants:  Clinton Gallant, PA Procedure:   #1: Right iliofemoral endarterectomy with vein patch angioplasty   #2: Right femoral to posterior tibial artery bypass graft with composite graft (6 mm external ring PTFE proximally and saphenous vein to the posterior tibial artery)   #3: Harvest of right saphenous vein Anesthesia:  General Blood Loss:  250  cc Specimens:  none  Findings: Heavily calcified plaque that was circumferential and the common femoral artery.  I could not occlude the artery with a clamp and had to use a Fogarty for control.  Endarterectomy was performed from the external iliac artery down into the profundofemoral artery.  I also endarterectomized the proximal superficial femoral artery.  I used a saphenous vein patch.  There was an excellent flow from the right common femoral artery after repair.  She did not have enough length of the vein for a vein bypass graft and so I did a composite graft using a 6 mm PTFE graft.  The PTFE goes from the groin to the proximal portion of the distal incision.  I then placed about a 10 cm segment of saphenous vein.  It was nonreversed.  I lysed the valves with a valvulotome.  I first sewed the anastomosis to the posterior tibial artery which was moderately calcified and relatively small about 2 mm.  I then performed a end to end anastomosis between the vein and the graft which had a good size match.  This anastomosis is at the very proximal portion of the distal incision.  She had a brisk posterior tibial Doppler signal that was graft dependent.  Indications: This is a 67 year old female with longstanding history of peripheral vascular disease having undergone bypass on the left side which has occluded.  She now has a right great toe ulcer.  She did not have any  percutaneous options for revascularization because of the extensive calcific plaque in the common femoral artery as well as a long segment occlusion.  She reconstitutes the anterior tibial artery however this does not perfuse the foot very well.  The posterior tibial artery is small but reconstitutes at the level of the calf and does opacify the foot.  She comes in today for revascularization.  Procedure:  The patient was identified in the holding area and taken to [facility] OR ROOM 11  The patient was then placed supine on the table. general anesthesia was administered.  The patient was prepped and draped in the usual sterile fashion.  A time out was called and antibiotics were administered.  A PA was necessary to expedite the procedure and assist with technical details.  She help with exposure by providing suction and retraction.  She help with the anastomoses by following the suture.  She help with wound closure.  I initially began by making an oblique incision in the groin.  Cautery was used to divide the subcutaneous tissue down to the femoral sheath which was opened sharply.  I exposed the common femoral artery from the inguinal ligament down to the bifurcation.  There were 2 profunda branches that were individually isolated as well as the superficial femoral artery.  The common femoral artery was heavily calcified with circumferential plaque.  Through the groin incision identified the saphenofemoral junction.  The saphenous vein was circumferentially exposed.  I then harvested saphenous vein through this incision and  then through 2 additional skip incisions.  Next, I made an incision in the lower leg below the calf.  Cautery was used divide subtenons tissue.  I identified the saphenous vein within this incision, and it was not large enough for bypass.  I then evaluated the vein with ultrasound.  Around the knee becomes too small for bypass.  Next, the fascia was opened with cautery and I exposed the posterior  tibial artery.  This was moderately calcified circumferentially and measuring about 2 mm.  It was fully mobilized.  Next, a long Gore tunneler was used to create a tunnel.  This was in the anatomic position in the popliteal fossa up to a subsartorial plane to the groin.  Next, I ligated the saphenofemoral junction with a 2-0 silk tie and then ligated the saphenous vein from the distal vein harvest incision.  The vein was distended with heparinized saline.  It distended nicely.  It was mildly thickened.  The patient was not fully heparinized.  Heparin levels were checked with ACT measurements and redosed appropriately.  I occluded the profundofemoral artery with Silastic loops.  I also occluded the common femoral artery with a Silastic loop as well as a Henley clamp.  A #11 blade was used to make an arteriotomy in the common femoral artery.  Because of the heavily calcified vessel I did not have proximal control.  I tried to advance a #4 Fogarty catheter into the iliac vessel however it would not go.  I was able to inflate the balloon within the proximal common femoral artery for some control.  I then proceeded to perform endarterectomy of the common femoral artery into the superficial femoral artery.  I also performed eversion endarterectomy of the profunda.  Only one of the 2 profunda vessels was patent.  There was good backbleeding from the profunda.  I then used a hand-held retractor to get a Henley clamp up into the pelvis.  I then continued with endarterectomy using a hemostat to recover nearly occlusive plaque in the proximal common femoral and distal external iliac artery.  I was able to get a good lumen.  There was excellent inflow.  The endarterectomized plane was irrigated.  All potential embolic debris was removed.  I then selected a portion of the saphenous vein that was previously harvested and opened this to be used as a pain patch.  Patch angioplasty using the saphenous vein was performed with a  running 6-0 Prolene.  The vein patch is onto the superficial femoral artery a few millimeters.  The proximal edges and the common femoral artery at the level of the inguinal ligament.  Once this was completed the clamps were released.  There was an excellent pulse within the common femoral artery.  There was also good profunda Doppler signal.  I then reoccluded the common femoral artery and opened the vein patch in the central portion.  This was extended longitudinally with Potts scissors.  A 6 mm external ring PTFE graft was brought on the field and spatulated to fit the size of the arteriotomy.  A running anastomosis was created with 6-0 Prolene.  Appropriate flushing maneuvers were performed and the anastomosis was secured.  There was excellent pulsatile flow through the graft.  Next, the graft was brought through the previously created tunnel making sure to maintain proper orientation.  A lateral was placed in the upper thigh followed by a tourniquet an Esmarch was used to exsanguinate the leg.  The tourniquet was taken to 150  mm of pressure.  Because of the appearance of the artery, I was unsure helpful down distally I was going to need to go and so I used a #11 blade to make an arteriotomy and extended this with Potts scissors.  There was a good lumen without significant stenosis in the posterior tibial artery.  I then brought the vein graft onto the field.  I placed it in nonreversed fashion.  Therefore I used a mL valvulotome to lyse the valves while the vein was not pressurized.  I was able to easily flush saline through the vein graft.  The vein was then spatulated to fit the size of the arteriotomy and a running anastomosis was created with 6-0 Prolene.  Once this was completed I then performed a end to end anastomosis between the PTFE graft and the vein.  There is approximately 10 cm of vein between the arterial anastomosis and the graft.  The anastomosis is at the proximal edge of the distal incision.   Prior to securing the anastomosis, the tourniquet was let down.  There was good backbleeding from the posterior tibial artery.  The bypass graft was then flushed and the anastomosis was secured.  Patient had an excellent pulse within the vein graft and a brisk posterior tibial Doppler signal that was graft dependent.  The patient's heparin was then reversed with 50 mg of protamine.  The incisions were irrigated.  The 2 vein harvest incisions in the upper thigh were closed with 2 layers of Vicryl.  I then reapproximated the fascia in the lower leg incision with 2-0 Vicryl.  Subcutaneous tissue was closed with 3-0 Vicryl followed by subcuticular closure.  In the groin, I reapproximated the femoral sheath with 2-0 Vicryl.  Subcutaneous tissue was then closed with 2 additional layers of 3-0 Vicryl followed by subcuticular closure.  Dermabond was then applied.  The patient was successfully extubated taken recovery in stable condition.  There were no immediate complications.   Disposition: To PACU stable.   Juleen China, M.D., [facility] [place] Office: [phone] Pager:  [phone]

## 2023-03-10 ENCOUNTER — Encounter (HOSPITAL_COMMUNITY): Payer: Self-pay | Admitting: Surgery

## 2023-03-10 LAB — LIPID PANEL
Cholesterol: 92 mg/dL (ref 0–200)
HDL: 38 mg/dL — ABNORMAL LOW (ref >40)
LDL Cholesterol: 38 mg/dL (ref 0–99)
Total CHOL/HDL Ratio: 2.4 {ratio}
Triglycerides: 81 mg/dL (ref <150)
VLDL: 16 mg/dL (ref 0–40)

## 2023-03-10 LAB — POCT I-STAT 7, (LYTES, BLD GAS, ICA,H+H)
Acid-base deficit: 1 mmol/L (ref 0.0–2.0)
Acid-base deficit: 3 mmol/L — ABNORMAL HIGH (ref 0.0–2.0)
Bicarbonate: 23.6 mmol/L (ref 20.0–28.0)
Bicarbonate: 21.6 mmol/L (ref 20.0–28.0)
Calcium, Ion: 1.22 mmol/L (ref 1.15–1.40)
Calcium, Ion: 1.17 mmol/L (ref 1.15–1.40)
HCT: 32 % — ABNORMAL LOW (ref 36.0–46.0)
HCT: 30 % — ABNORMAL LOW (ref 36.0–46.0)
Hemoglobin: 10.9 g/dL — ABNORMAL LOW (ref 12.0–15.0)
Hemoglobin: 10.2 g/dL — ABNORMAL LOW (ref 12.0–15.0)
O2 Saturation: 100 %
O2 Saturation: 99 %
Potassium: 3.7 mmol/L (ref 3.5–5.1)
Potassium: 3.7 mmol/L (ref 3.5–5.1)
Sodium: 141 mmol/L (ref 135–145)
Sodium: 142 mmol/L (ref 135–145)
TCO2: 25 mmol/L (ref 22–32)
TCO2: 23 mmol/L (ref 22–32)
pCO2 arterial: 36.9 mm[Hg] (ref 32–48)
pCO2 arterial: 36.9 mm[Hg] (ref 32–48)
pH, Arterial: 7.414 (ref 7.35–7.45)
pH, Arterial: 7.375 (ref 7.35–7.45)
pO2, Arterial: 213 mm[Hg] — ABNORMAL HIGH (ref 83–108)
pO2, Arterial: 169 mm[Hg] — ABNORMAL HIGH (ref 83–108)

## 2023-03-10 LAB — CBC
HCT: 26.9 % — ABNORMAL LOW (ref 36.0–46.0)
Hemoglobin: 8.3 g/dL — ABNORMAL LOW (ref 12.0–15.0)
MCH: 24.6 pg — ABNORMAL LOW (ref 26.0–34.0)
MCHC: 30.9 g/dL (ref 30.0–36.0)
MCV: 79.6 fL — ABNORMAL LOW (ref 80.0–100.0)
Platelets: 223 10*3/uL (ref 150–400)
RBC: 3.38 MIL/uL — ABNORMAL LOW (ref 3.87–5.11)
RDW: 17.3 % — ABNORMAL HIGH (ref 11.5–15.5)
WBC: 11.9 10*3/uL — ABNORMAL HIGH (ref 4.0–10.5)
nRBC: 0 % (ref 0.0–0.2)

## 2023-03-10 LAB — BASIC METABOLIC PANEL
Anion gap: 7 (ref 5–15)
BUN: 12 mg/dL (ref 8–23)
CO2: 23 mmol/L (ref 22–32)
Calcium: 8.7 mg/dL — ABNORMAL LOW (ref 8.9–10.3)
Chloride: 109 mmol/L (ref 98–111)
Creatinine, Ser: 0.79 mg/dL (ref 0.44–1.00)
GFR, Estimated: >60 mL/min (ref >60)
Glucose, Bld: 196 mg/dL — ABNORMAL HIGH (ref 70–99)
Potassium: 4 mmol/L (ref 3.5–5.1)
Sodium: 139 mmol/L (ref 135–145)

## 2023-03-10 LAB — POCT ACTIVATED CLOTTING TIME
Activated Clotting Time: 233 s
Activated Clotting Time: 204 s
Activated Clotting Time: 227 s
Activated Clotting Time: 216 s
Activated Clotting Time: 233 s

## 2023-03-10 LAB — GLUCOSE, CAPILLARY
Glucose-Capillary: 174 mg/dL — ABNORMAL HIGH (ref 70–99)
Glucose-Capillary: 197 mg/dL — ABNORMAL HIGH (ref 70–99)
Glucose-Capillary: 175 mg/dL — ABNORMAL HIGH (ref 70–99)
Glucose-Capillary: 225 mg/dL — ABNORMAL HIGH (ref 70–99)

## 2023-03-10 MED ORDER — EMPAGLIFLOZIN-METFORMIN HCL 5-500 MG PO TABS
1.0000 | ORAL_TABLET | Freq: Two times a day (BID) | ORAL | Status: DC
  Filled 2023-03-10: qty 1

## 2023-03-10 MED ORDER — EMPAGLIFLOZIN-METFORMIN HCL 5-500 MG PO TABS
1.0000 | ORAL_TABLET | Freq: Two times a day (BID) | ORAL | Status: DC
  Administered 2023-03-10 – 2023-03-11 (×2): 1 via ORAL
  Filled 2023-03-10 (×4): qty 1

## 2023-03-10 MED ORDER — SEMAGLUTIDE (1 MG/DOSE) 4 MG/3ML ~~LOC~~ SOPN
1.0000 mg | PEN_INJECTOR | SUBCUTANEOUS | Status: DC
  Administered 2023-03-10: 1 mg via SUBCUTANEOUS
  Filled 2023-03-10 (×3): qty 0.75

## 2023-03-10 NOTE — Plan of Care (Signed)
  Problem: Education: Goal: Knowledge of General Education information will improve Description: Including pain rating scale, medication(s)/side effects and non-pharmacologic comfort measures Outcome: Progressing   Problem: Health Behavior/Discharge Planning: Goal: Ability to manage health-related needs will improve Outcome: Progressing   Problem: Clinical Measurements: Goal: Ability to maintain clinical measurements within normal limits will improve Outcome: Progressing Goal: Will remain free from infection Outcome: Progressing Goal: Diagnostic test results will improve Outcome: Progressing Goal: Respiratory complications will improve Outcome: Progressing Goal: Cardiovascular complication will be avoided Outcome: Progressing   Problem: Activity: Goal: Risk for activity intolerance will decrease Outcome: Progressing   Problem: Nutrition: Goal: Adequate nutrition will be maintained Outcome: Progressing   Problem: Coping: Goal: Level of anxiety will decrease Outcome: Progressing   Problem: Elimination: Goal: Will not experience complications related to bowel motility Outcome: Progressing Goal: Will not experience complications related to urinary retention Outcome: Progressing   Problem: Pain Management: Goal: General experience of comfort will improve Outcome: Progressing   Problem: Safety: Goal: Ability to remain free from injury will improve Outcome: Progressing   Problem: Skin Integrity: Goal: Risk for impaired skin integrity will decrease Outcome: Progressing   Problem: Education: Goal: Knowledge of prescribed regimen will improve Outcome: Progressing   Problem: Activity: Goal: Ability to tolerate increased activity will improve Outcome: Progressing   Problem: Bowel/Gastric: Goal: Gastrointestinal status for postoperative course will improve Outcome: Progressing   Problem: Clinical Measurements: Goal: Postoperative complications will be avoided or  minimized Outcome: Progressing Goal: Signs and symptoms of graft occlusion will improve Outcome: Progressing   Problem: Skin Integrity: Goal: Demonstration of wound healing without infection will improve Outcome: Progressing

## 2023-03-10 NOTE — Progress Notes (Addendum)
[facility]  Subjective  - Doing well over all, pain controlled   Objective (!) 113/50 60 97.8 F (36.6 C) (Oral) 17 98%  Intake/Output Summary (Last 24 hours) at 03/10/2023 0827 Last data filed at 03/10/2023 0600 Gross per 24 hour  Intake 3747.88 ml  Output 1300 ml  Net 2447.88 ml    Right LE incision healing well, compartments soft, groin without hematoma Brisk PT doppler, weak DP signal Lungs non labored breathing  Assessment/Planning: POD #1 right fem-PT bypass hybrid PTFE/vein with right CFA endarterectomy  PT doppler flow brisk right LE,  right GT wound dry left open to air She is anxious an worried.  She has had a minimal amount of nausea, but tolerating PO's HGB 8.3 this am Started on ASA yesterday then added Plavix today.  Plan for restart of Xarelto tomorrow is stable and no incision bleeding noted. Plan for home medication to be brought due to pharmacy not carrying her home meds. Ozempic and Synjardy. She still works full time and wants to get back to work as soon as she can.  She has a seated job. PT/OT ordered for mobility  Mosetta Pigeon 03/10/2023 8:27 AM --  Laboratory Lab Results: Recent Labs    03/09/23 1100 03/10/23 0412  WBC 9.7 11.9*  HGB 10.7* 8.3*  HCT 35.2* 26.9*  PLT 284 223   BMET Recent Labs    03/09/23 1100 03/10/23 0412  NA 140 139  K 3.9 4.0  CL 107 109  CO2 26 23  GLUCOSE 102* 196*  BUN 10 12  CREATININE 0.81 0.79  CALCIUM 9.1 8.7*    COAG Lab Results  Component Value Date   INR 1.0 03/09/2023   INR 1.3 (H) 03/08/2020   INR 1.05 07/16/2014   No results found for: "PTT"  VASCULAR STAFF ADDENDUM: I have independently interviewed and examined the patient. I agree with the above.  Multiphasic PT signal and bypass signal. Plan for PT/OT today Consider restarting Xaralto tomorrow  Daria Pastures MD [facility] Phone Number: [phone] 03/10/2023 10:24 AM

## 2023-03-10 NOTE — Evaluation (Addendum)
Occupational Therapy Evaluation Patient Details Name: Theresa Harrington MRN: [id number] DOB: [birth year] Today's Date: 03/10/2023   History of Present Illness Pt is a 67 yo female admitted 11/8 with foot wounds for scheduled  Right fem-PT bypass hybrid PTFE/vein with right CFA endarterectomy. PMH: HTN, HLD, GERD, PAD, PAF, T2DM, anemia, PAF, clotting disorder, hypothyroidism    Clinical Impression   At baseline, pt is Independent with ADLs, IADLs, and functional mobility without an AD and drives. Pt now presents with decreased activity tolerance, generalized B UE weakness, decreased dynamic balance during functional tasks, discomfort as incision site affecting functional level, and decreased safety and independence with functional tasks. Pt currently demonstrates ability to complete ADLs Independent to Contact guard assist and functional mobility/transfers with a RW with close Supervision to Contact guard assist for safety with no physical assist needed. VSS on RA throughout session. Pt will benefit from acute skilled OT services to address deficits outlined below and increase safety and independence with functional tasks. No post acute OT follow up is indicated at this time.       If plan is discharge home, recommend the following: A little help with walking and/or transfers;A little help with bathing/dressing/bathroom;Assistance with cooking/housework;Assist for transportation;Help with stairs or ramp for entrance    Functional Status Assessment  Patient has had a recent decline in their functional status and demonstrates the ability to make significant improvements in function in a reasonable and predictable amount of time.  Equipment Recommendations  Other (comment) (Rolling walker)    Recommendations for Other Services       Precautions / Restrictions Precautions Precautions: Fall Restrictions Weight Bearing Restrictions: No      Mobility Bed Mobility Overal bed mobility: Needs  Assistance Bed Mobility: Supine to Sit     Supine to sit: Supervision          Transfers Overall transfer level: Needs assistance Equipment used: Rolling walker (2 wheels) Transfers: Sit to/from Stand, Bed to chair/wheelchair/BSC Sit to Stand: Contact guard assist     Step pivot transfers: Contact guard assist (with use of RW)            Balance Overall balance assessment: Needs assistance Sitting-balance support: No upper extremity supported, Feet supported Sitting balance-Leahy Scale: Good     Standing balance support: Single extremity supported, Bilateral upper extremity supported, No upper extremity supported, During functional activity Standing balance-Leahy Scale: Fair Standing balance comment: Able to maintain balance in static stand; mild deficits noted in dynamic standing and funcitonal transfers/mobility                           ADL either performed or assessed with clinical judgement   ADL Overall ADL's : Needs assistance/impaired Eating/Feeding: Independent;Sitting   Grooming: Supervision/safety;Contact guard assist;Standing Grooming Details (indicate cue type and reason): close Supervision to CGA for safety; no physical assist needed Upper Body Bathing: Set up;Sitting   Lower Body Bathing: Contact guard assist;Sitting/lateral leans;Sit to/from stand   Upper Body Dressing : Supervision/safety;Sitting Upper Body Dressing Details (indicate cue type and reason): Supervision for line management Lower Body Dressing: Contact guard assist;Sitting/lateral leans;Sit to/from stand Lower Body Dressing Details (indicate cue type and reason): close Supervision to [facility] for safety; no physical assist needed Toilet Transfer: Supervision/safety;Contact guard assist;Ambulation;Regular Toilet;Rolling walker (2 wheels) Toilet Transfer Details (indicate cue type and reason): close Supervision to CGA for safety; no physical assist needed Toileting- Clothing  Manipulation and Hygiene: Supervision/safety;Contact guard assist;Sitting/lateral lean;Sit to/from stand  Toileting - Clothing Manipulation Details (indicate cue type and reason): close Supervision to CGA for safety; no physical assist needed     Functional mobility during ADLs: Supervision/safety;Contact guard assist;Rolling walker (2 wheels) (close Supervision to CGA for safety; no physical assist needed) General ADL Comments: Pt with decreased activity tolerance and discomfort in R LE at incision site affecting functional level.     Vision Baseline Vision/History: 1 Wears glasses (hx of cataract surgery; reports recieving eye drops for glaucoma) Ability to See in Adequate Light: 0 Adequate (with glasses) Patient Visual Report: No change from baseline       Perception         Praxis         Pertinent Vitals/Pain Pain Assessment Pain Assessment: Faces Faces Pain Scale: Hurts little more Pain Location: R LE at incision site, worse in standing/ambulation Pain Descriptors / Indicators: Discomfort, Grimacing, Guarding, Operative site guarding Pain Intervention(s): Limited activity within patient's tolerance, Monitored during session, Repositioned     Extremity/Trunk Assessment Upper Extremity Assessment Upper Extremity Assessment: Right hand dominant;Generalized weakness   Lower Extremity Assessment Lower Extremity Assessment: Defer to PT evaluation   Cervical / Trunk Assessment Cervical / Trunk Assessment: Normal   Communication Communication Communication: No apparent difficulties   Cognition Arousal: Alert Behavior During Therapy: Flat affect, Agitated, Restless (mildly agitated) Overall Cognitive Status: Within Functional Limits for tasks assessed                                 General Comments: AAOx4 and demonstrates ability to follow multi-step directions consistently     General Comments  VSS on RA throughout session. Pt's friend present throughout  session. RN present at end of session.    Exercises     Shoulder Instructions      Home Living Family/patient expects to be discharged to:: Private residence Living Arrangements: Alone Available Help at Discharge: Available PRN/intermittently;Family;Friend(s) Type of Home: House Home Access: Stairs to enter [employer] of Steps: 2 Entrance Stairs-Rails: Left Home Layout: One level     Bathroom Shower/Tub: Producer, television/film/video: Handicapped height     Home Equipment: Shower seat - built in          Prior Functioning/Environment Prior Level of Function : Independent/Modified Independent;Driving             Mobility Comments: Independent without an AD ADLs Comments: Independent with ADLs and IADLs, drives        OT Problem List: Decreased strength;Decreased activity tolerance;Impaired balance (sitting and/or standing);Decreased knowledge of use of DME or AE      OT Treatment/Interventions: Self-care/ADL training;Therapeutic exercise;Energy conservation;DME and/or AE instruction;Therapeutic activities;Patient/family education;Balance training    OT Goals(Current goals can be found in the care plan section) Acute Rehab OT Goals Patient Stated Goal: to return home OT Goal Formulation: With patient Time For Goal Achievement: 03/24/23 Potential to Achieve Goals: Good ADL Goals Pt Will Perform Grooming: with modified independence;standing Pt Will Perform Lower Body Bathing: with modified independence;sitting/lateral leans;sit to/from stand Pt Will Perform Lower Body Dressing: with modified independence;sitting/lateral leans;sit to/from stand Pt Will Perform Toileting - Clothing Manipulation and hygiene: with modified independence;sit to/from stand;sitting/lateral leans Pt Will Perform Tub/Shower Transfer: with modified independence;ambulating;shower seat (with least restrictive AD) Pt/caregiver will Perform Home Exercise Program: Increased  strength;Both right and left upper extremity;With theraband;With theraputty;Independently;With written HEP provided Additional ADL Goal #1: Patient will demonstrate ability to Independently  state 4 energy conservation strategies to increase safety and independence with functional tasks.  OT Frequency: Min 1X/week    Co-evaluation              AM-PAC OT "6 Clicks" Daily Activity     Outcome Measure Help from another person eating meals?: None Help from another person taking care of personal grooming?: A Little Help from another person toileting, which includes using toliet, bedpan, or urinal?: A Little Help from another person bathing (including washing, rinsing, drying)?: A Little Help from another person to put on and taking off regular upper body clothing?: A Little Help from another person to put on and taking off regular lower body clothing?: A Little 6 Click Score: 19   End of Session Equipment Utilized During Treatment: Rolling walker (2 wheels) Nurse Communication: Mobility status;Other (comment) (Pt will benefit from acute OT services. No post acute OT follow up indicated at this time.)  Activity Tolerance: Patient tolerated treatment well Patient left: in bed;with call bell/phone within reach;with family/visitor present;Other (comment) (sitting EOB, RN present)  OT Visit Diagnosis: Unsteadiness on feet (R26.81);Other abnormalities of gait and mobility (R26.89);Muscle weakness (generalized) (M62.81);Other (comment) (decreased activity tolerance)                Time: 0865-7846 OT Time Calculation (min): 16 min Charges:  OT General Charges $OT Visit: 1 Visit OT Evaluation $OT Eval Low Complexity: 1 Low  [address]" M., OTR/L, MA Acute Rehab [phone]   Lendon Colonel 03/10/2023, 3:37 PM

## 2023-03-10 NOTE — Evaluation (Signed)
Physical Therapy Evaluation Patient Details Name: Theresa Harrington MRN: [id number] DOB: 17-Apr-1956 Today's Date: 03/10/2023  History of Present Illness  Pt is a 67 yo female admitted 11/8 with foot wounds for scheduled Rt femoral-postieror tibial artey BPG. PMH: HTN, HLD, GERD, PAD, PAF, T2DM  Clinical Impression  Pt pleasant and agreeable to mobility. Pt reported mild discomfort in RLE but no pain, did not worsen with session. Able to complete bed mobility with supervision for line management, minA for STS transfer during initial weight bearing, will progress quickly to no assistance. Ambulated 255ft with supervision to manage IV pole, completed with RW pt states she uses no AD at baseline but agreed to use RW for safety, stability, and facilitation of heel toe gait pattern post operatively. Pt would benefit from continued skilled acute physical therapy to improve safety and independence. Pt would benefit from outpatient physical therapy to further address functional deficits.          If plan is discharge home, recommend the following: A little help with walking and/or transfers;Assist for transportation;A little help with bathing/dressing/bathroom   Can travel by private vehicle        Equipment Recommendations Rolling walker (2 wheels)  Recommendations for Other Services       Functional Status Assessment Patient has had a recent decline in their functional status and demonstrates the ability to make significant improvements in function in a reasonable and predictable amount of time.     Precautions / Restrictions Precautions Precautions: None Restrictions Weight Bearing Restrictions: No      Mobility  Bed Mobility Overal bed mobility: Needs Assistance Bed Mobility: Supine to Sit     Supine to sit: Supervision     General bed mobility comments: able to roll and sit up from flat bed position, supervison to assist with line management    Transfers Overall transfer level:  Needs assistance Equipment used: Rolling walker (2 wheels) Transfers: Sit to/from Stand Sit to Stand: Min assist           General transfer comment: minA during intial weight bearing to help arise to standing    Ambulation/Gait Ambulation/Gait assistance: Supervision Gait Distance (Feet): 200 Feet Assistive device: Rolling walker (2 wheels) Gait Pattern/deviations: Step-through pattern, Decreased step length - right, Decreased dorsiflexion - right, Decreased stance time - right   Gait velocity interpretation: 1.31 - 2.62 ft/sec, indicative of limited community ambulator   General Gait Details: Use of RW to help offload weight from RLE to aide in initiating heel strike and normal gait pattern  Stairs            Wheelchair Mobility     Tilt Bed    Modified Rankin (Stroke Patients Only)       Balance Overall balance assessment: Needs assistance Sitting-balance support: Feet supported, Feet unsupported, No upper extremity supported Sitting balance-Leahy Scale: Good     Standing balance support: Bilateral upper extremity supported, During functional activity, Reliant on assistive device for balance Standing balance-Leahy Scale: Fair Standing balance comment: Mild deficits due to discomfort and guarded use of RLE                             Pertinent Vitals/Pain Pain Assessment Pain Assessment: 0-10 Pain Score: 4  Pain Descriptors / Indicators: Discomfort Pain Intervention(s): Limited activity within patient's tolerance, Monitored during session    Home Living Family/patient expects to be discharged to:: Private residence Living Arrangements: Alone Available Help  at Discharge: Available PRN/intermittently;Family;Friend(s) Type of Home: House Home Access: Stairs to enter Entrance Stairs-Rails: Right Entrance Stairs-Number of Steps: 2   Home Layout: One level Home Equipment: None      Prior Function Prior Level of Function :  Independent/Modified Independent;Driving                     Extremity/Trunk Assessment   Upper Extremity Assessment Upper Extremity Assessment: Defer to OT evaluation    Lower Extremity Assessment Lower Extremity Assessment: RLE deficits/detail    Cervical / Trunk Assessment Cervical / Trunk Assessment: Normal  Communication   Communication Communication: No apparent difficulties Cueing Techniques: Verbal cues  Cognition Arousal: Alert Behavior During Therapy: WFL for tasks assessed/performed Overall Cognitive Status: Within Functional Limits for tasks assessed                                          General Comments      Exercises General Exercises - Lower Extremity Ankle Circles/Pumps: AROM, Both, 10 reps, Seated Long Arc Quad: AROM, Both, 10 reps, Seated Toe Raises: AROM, Both, 10 reps, Seated   Assessment/Plan    PT Assessment Patient needs continued PT services  PT Problem List Decreased balance;Decreased mobility;Decreased strength       PT Treatment Interventions DME instruction;Gait training;Stair training;Functional mobility training;Therapeutic activities;Therapeutic exercise;Balance training    PT Goals (Current goals can be found in the Care Plan section)  Acute Rehab PT Goals Patient Stated Goal: Return to home and work PT Goal Formulation: With patient Time For Goal Achievement: 03/24/23 Potential to Achieve Goals: Good    Frequency Min 1X/week     Co-evaluation               AM-PAC PT "6 Clicks" Mobility  Outcome Measure Help needed turning from your back to your side while in a flat bed without using bedrails?: None Help needed moving from lying on your back to sitting on the side of a flat bed without using bedrails?: None Help needed moving to and from a bed to a chair (including a wheelchair)?: A Little Help needed standing up from a chair using your arms (e.g., wheelchair or bedside chair)?: A  Little Help needed to walk in hospital room?: A Little Help needed climbing 3-5 steps with a railing? : A Little 6 Click Score: 20    End of Session   Activity Tolerance: Patient tolerated treatment well Patient left: in chair;with call bell/phone within reach;with chair alarm set Nurse Communication: Mobility status PT Visit Diagnosis: Unsteadiness on feet (R26.81);Other abnormalities of gait and mobility (R26.89)    Time: 1610-9604 PT Time Calculation (min) (ACUTE ONLY): 27 min   Charges:   PT Evaluation $PT Eval Moderate Complexity: 1 Mod PT Treatments $Gait Training: 8-22 mins PT General Charges $$ ACUTE PT VISIT: 1 Visit         Andrey Farmer SPT Secure chat preferred\  Darlin Drop 03/10/2023, 10:27 AM

## 2023-03-10 NOTE — Progress Notes (Signed)
PHARMACIST LIPID MONITORING   Theresa Harrington is a 67 y.o. female admitted on 03/09/2023 with scheduled Rt femoral-postieror tibial artey BPG .  Pharmacy has been consulted to optimize lipid-lowering therapy with the indication of secondary prevention for clinical ASCVD.  Recent Labs:  Lipid Panel (last 6 months):   Lab Results  Component Value Date   CHOL 92 03/10/2023   TRIG 81 03/10/2023   HDL 38 (L) 03/10/2023   CHOLHDL 2.4 03/10/2023   VLDL 16 03/10/2023   LDLCALC 38 03/10/2023    Hepatic function panel (last 6 months):   Lab Results  Component Value Date   AST 19 03/09/2023   ALT 19 03/09/2023   ALKPHOS 55 03/09/2023   BILITOT 0.7 03/09/2023   BILIDIR 0.1 02/20/2023    SCr (since admission):   Serum creatinine: 0.79 mg/dL 57/84/69 6295 Estimated creatinine clearance: 71 mL/min  Current therapy and lipid therapy tolerance Current lipid-lowering therapy: Rosuvastatin 5mg  daily   Previous lipid-lowering therapies (if applicable): atorvastatin 80mg  (muscle aches), ezetimibe-simvastatin 10-40mg  (2013- cost concern), pitavastatin 2 mg (2017), Repatha (2024- cost concern)  Documented or reported allergies or intolerances to lipid-lowering therapies (if applicable): Yes, see above  Assessment:   Patient agrees with changes to lipid-lowering therapy. She says it has been a long time since she was intolerance to atorvastatin and is willing to "do whatever she needs to do". Will increase current statin to high intensity.   Plan:    1.Statin intensity (high intensity recommended for all patients regardless of the LDL):  Add or increase statin to high intensity. Change Rosuvastatin 5mg  to 20mg  daily    2.Add ezetimibe (if any one of the following):   Not indicated at this time.  3.Refer to lipid clinic:   No  4.Follow-up with:  Primary care provider - Etta Grandchild, MD  5.Follow-up labs after discharge:  Changes in lipid therapy were made. Check a lipid panel in 8-12 weeks then  annually.      Blane Ohara, PharmD  PGY2 Pharmacy Resident

## 2023-03-11 LAB — GLUCOSE, CAPILLARY
Glucose-Capillary: 125 mg/dL — ABNORMAL HIGH (ref 70–99)
Glucose-Capillary: 135 mg/dL — ABNORMAL HIGH (ref 70–99)
Glucose-Capillary: 151 mg/dL — ABNORMAL HIGH (ref 70–99)
Glucose-Capillary: 205 mg/dL — ABNORMAL HIGH (ref 70–99)
Glucose-Capillary: 185 mg/dL — ABNORMAL HIGH (ref 70–99)

## 2023-03-11 MED ORDER — EMPAGLIFLOZIN-METFORMIN HCL 5-500 MG PO TABS
1.0000 | ORAL_TABLET | Freq: Two times a day (BID) | ORAL | Status: DC
  Administered 2023-03-11 – 2023-03-13 (×4): 1 via ORAL
  Filled 2023-03-11 (×4): qty 1

## 2023-03-11 MED ORDER — RIVAROXABAN 20 MG PO TABS
20.0000 mg | ORAL_TABLET | Freq: Every day | ORAL | Status: DC
  Administered 2023-03-11 – 2023-03-12 (×2): 20 mg via ORAL
  Filled 2023-03-11 (×2): qty 1

## 2023-03-11 NOTE — Progress Notes (Signed)
Mobility Specialist Progress Note:   03/11/23 1100  Mobility  Activity Ambulated with assistance in hallway  Level of Assistance Standby assist, set-up cues, supervision of patient - no hands on  Assistive Device Front wheel walker  Distance Ambulated (ft) 225 ft  Activity Response Tolerated well  Mobility Referral Yes  $Mobility charge 1 Mobility  Mobility Specialist Start Time (ACUTE ONLY) 1017  Mobility Specialist Stop Time (ACUTE ONLY) 1030  Mobility Specialist Time Calculation (min) (ACUTE ONLY) 13 min   Pre Mobility: 64 HR During Mobility: 70 HR  Post Mobility: 64 HR   Pt received in bed, eager to mobility. SB to stand and ambulate in hallway. Pt c/o slight nausea, otherwise asymptomatic throughout. Pt returned to bed with call bell in reach and all needs met.  Leory Plowman  Mobility Specialist Please contact via [employer] office at [phone]

## 2023-03-11 NOTE — Progress Notes (Signed)
Mobility Specialist Progress Note:   03/11/23 1527  Mobility  Activity Ambulated with assistance in hallway  Level of Assistance Standby assist, set-up cues, supervision of patient - no hands on  Distance Ambulated (ft) 225 ft  Activity Response Tolerated well  Mobility Referral Yes  $Mobility charge 1 Mobility  Mobility Specialist Start Time (ACUTE ONLY) 1450  Mobility Specialist Stop Time (ACUTE ONLY) 1505  Mobility Specialist Time Calculation (min) (ACUTE ONLY) 15 min   Pre Mobility: 59 HR  During Mobility: 83  HR  Post Mobility: 67 HR   Pt received in bed, agreeable to mobility. C/o stiffness in RLE but stated that it resolved towards EOS. No other complaints stated, asymptomatic throughout. Pt returned to bed with all needs met. RN present in room.  Leory Plowman  Mobility Specialist Please contact via [employer] office at [phone]

## 2023-03-11 NOTE — Progress Notes (Signed)
PHARMACY - ANTICOAGULATION CONSULT NOTE  Pharmacy Consult for Xarelto  Indication: atrial fibrillation  Allergies  Allergen Reactions   Amlodipine Swelling   Atorvastatin Other (See Comments)    Muscle aches    Ampicillin Nausea And Vomiting and Other (See Comments)   Codeine Nausea And Vomiting    Ask patient   Lisinopril Cough        Penicillins Nausea And Vomiting    Did it involve swelling of the face/tongue/throat, SOB, or low BP? No Did it involve sudden or severe rash/hives, skin peeling, or any reaction on the inside of your mouth or nose? No Did you need to seek medical attention at a hospital or doctor's office? No When did it last happen?      20+ years If all above answers are "NO", may proceed with cephalosporin use.    Januvia [Sitagliptin] Nausea And Vomiting    Patient Measurements: Height: 5\' 5"  (165.1 cm) Weight: 77.1 kg (170 lb) IBW/kg (Calculated) : 57  Vital Signs: Temp: 98.3 F (36.8 C) (11/10 0819) Temp Source: Oral (11/10 0819) BP: 117/51 (11/10 0827) Pulse Rate: 62 (11/10 0819)  Labs: Recent Labs    03/09/23 1100 03/09/23 1437 03/09/23 1612 03/10/23 0412  HGB 10.7* 10.9* 10.2* 8.3*  HCT 35.2* 32.0* 30.0* 26.9*  PLT 284  --   --  223  APTT 26  --   --   --   LABPROT 13.8  --   --   --   INR 1.0  --   --   --   CREATININE 0.81  --   --  0.79    Estimated Creatinine Clearance: 71 mL/min (by C-G formula based on SCr of 0.79 mg/dL).   Medical History: Past Medical History:  Diagnosis Date   Allergy    Anxiety    Pt. denies   Arthritis    right index figer   Asymptomatic cholelithiasis    Atherosclerosis of aorta (HCC)    Cataract    Clotting disorder (HCC)    Gallstones 06/2013   GERD (gastroesophageal reflux disease)    Glaucoma    "A LITTLE BIT"   History of kidney stones    lithotrispy   Hyperlipidemia    Hypertension    Hypothyroidism    no meds   Iron deficiency anemia    PAD (peripheral artery disease) (HCC)     PAF (paroxysmal atrial fibrillation) (HCC)    Peripheral arterial occlusive disease (HCC)    lower extremities   Pneumonia    PONV (postoperative nausea and vomiting)    Right ureteral stone    Type 2 diabetes mellitus (HCC)    Type   Vitamin B 12 deficiency    Wears glasses     Medications:  Scheduled:   aspirin EC  81 mg Oral Q0600   clopidogrel  75 mg Oral Q0600   diltiazem  180 mg Oral Daily   docusate sodium  100 mg Oral Daily   dorzolamide-timolol  1 drop Right Eye BID   doxycycline  50 mg Oral Q12H   Empagliflozin-metFORMIN HCl  1 tablet Oral BID   ezetimibe  10 mg Oral Daily   heparin  5,000 Units Subcutaneous Q8H   hydrALAZINE  50 mg Oral TID   insulin aspart  14 Units Subcutaneous TID WC   insulin glargine-yfgn  60 Units Subcutaneous Daily   latanoprost  1 drop Both Eyes QHS   loratadine  10 mg Oral Daily   magnesium oxide  400 mg Oral Daily   nebivolol  10 mg Oral Daily   pantoprazole  40 mg Oral Daily   rosuvastatin  5 mg Oral Daily   Semaglutide (1 MG/DOSE)  1 mg Subcutaneous Weekly    Assessment: Theresa Harrington is a 67 y.o. female admitted on 03/09/2023 with scheduled right femoral-postieror tibial artey BPG who was on Xarelto prior to surgery now ready to restart per VVS. Patient will be on triple therapy with asa, plavix, and Xarelto. Spoke with vascular team who said that the duration of triple therapy will be decided at patients follow up appointment. No hemoglobin today to assess. Hgb previously in the 10s with a drop post surgery, as expected.   Goal of Therapy:  Monitor platelets by anticoagulation protocol: Yes   Plan:  Resume Xarelto 20mg  once daily with dinner  Continue aspirin and plavix  Monitor for signs and symptoms of bleeding - cbc tomorrow   Blane Ohara, PharmD  PGY2 Pharmacy Resident

## 2023-03-11 NOTE — TOC Progression Note (Signed)
Transition of Care [facility]) - Progression Note    Patient Details  Name: Theresa Harrington MRN: [id number] Date of Birth: [birth year]  Transition of Care [facility]) CM/SW Contact  Ronny Bacon, RN Phone Number: 03/11/2023, 10:32 AM  Clinical Narrative:   Secure chat received from PT regarding patient request for Rolling walker and bedside commode. DME ordered through [facility] with [employer] to be delivered to bedside prior to discharge.         Expected Discharge Plan and Services                         DME Arranged: Walker rolling, Bedside commode DME Agency: [employer] Date DME Agency Contacted: 03/11/23 Time DME Agency Contacted: 1030 Representative spoke with at DME Agency: Vaughan Basta             Social Determinants of Health (SDOH) Interventions SDOH Screenings   Food Insecurity: No Food Insecurity (03/09/2023)  Housing: Low Risk  (03/09/2023)  Transportation Needs: No Transportation Needs (03/09/2023)  Utilities: Not At Risk (03/09/2023)  Alcohol Screen: Low Risk  (12/07/2021)  Depression (PHQ2-9): Low Risk  (09/13/2022)  Financial Resource Strain: Low Risk  (12/07/2021)  Physical Activity: Inactive (12/07/2021)  Social Connections: Moderately Integrated (12/07/2021)  Stress: No Stress Concern Present (12/07/2021)  Tobacco Use: Medium Risk (03/09/2023)    Readmission Risk Interventions     No data to display

## 2023-03-11 NOTE — Plan of Care (Signed)
  Problem: Clinical Measurements: Goal: Ability to maintain clinical measurements within normal limits will improve Outcome: Progressing   Problem: Clinical Measurements: Goal: Diagnostic test results will improve Outcome: Progressing   Problem: Clinical Measurements: Goal: Respiratory complications will improve Outcome: Progressing   Problem: Clinical Measurements: Goal: Cardiovascular complication will be avoided Outcome: Progressing   Problem: Education: Goal: Knowledge of General Education information will improve Description: Including pain rating scale, medication(s)/side effects and non-pharmacologic comfort measures Outcome: Progressing   Problem: Elimination: Goal: Will not experience complications related to urinary retention Outcome: Progressing

## 2023-03-11 NOTE — Progress Notes (Addendum)
[facility] [place]  Subjective  - Feeling a little better   Objective (!) 113/50 64 98.3 F (36.8 C) (Oral) 20 96%  Intake/Output Summary (Last 24 hours) at 03/11/2023 0350 Last data filed at 03/10/2023 1500 Gross per 24 hour  Intake 614.95 ml  Output 500 ml  Net 114.95 ml   GT toe wound dry no change Brisk PT signal right LE, no edema Incisions healing well, groin soft without hematoma Lungs non labored breathing   Assessment/Planning: POD # 2 right fem-PT bypass hybrid PTFE/vein with right CFA endarterectomy   PT signal brisk, GT toe wound open to air Mobility progressing she lives alone PT/OT ordered  Tolerating PO without V/N Possible D/C tomorrow On ASA, Plavix, and will start Xarelto today    Mosetta Pigeon 03/11/2023 3:50 AM --  Laboratory Lab Results: Recent Labs    03/09/23 1100 03/09/23 1437 03/09/23 1612 03/10/23 0412  WBC 9.7  --   --  11.9*  HGB 10.7*   < > 10.2* 8.3*  HCT 35.2*   < > 30.0* 26.9*  PLT 284  --   --  223   < > = values in this interval not displayed.   BMET Recent Labs    03/09/23 1100 03/09/23 1437 03/09/23 1612 03/10/23 0412  NA 140   < > 142 139  K 3.9   < > 3.7 4.0  CL 107  --   --  109  CO2 26  --   --  23  GLUCOSE 102*  --   --  196*  BUN 10  --   --  12  CREATININE 0.81  --   --  0.79  CALCIUM 9.1  --   --  8.7*   < > = values in this interval not displayed.    COAG Lab Results  Component Value Date   INR 1.0 03/09/2023   INR 1.3 (H) 03/08/2020   INR 1.05 07/16/2014   No results found for: "PTT"   VASCULAR STAFF ADDENDUM: I have independently interviewed and examined the patient. I agree with the above.   Daria Pastures MD [facility] Phone Number: [phone] 03/11/2023 11:41 AM

## 2023-03-11 NOTE — Progress Notes (Signed)
Patient was nauseated in the morning and had received a dose of zofran. She didn't eat her all themeals and been drinking only few cups of orange juice had a bite of sandwiches. Consulted with the pharmacy about her insulin doses and considered not to give her because of poor oral intake. Her vital signs within normal range, no any complains

## 2023-03-11 NOTE — Care Management (Cosign Needed)
    Durable Medical Equipment  (From admission, onward)           Start     Ordered   03/11/23 1039  For home use only DME Bedside commode  Once       Question:  Patient needs a bedside commode to treat with the following condition  Answer:  Weakness   03/11/23 1038   03/11/23 1038  For home use only DME Walker rolling  Once       Question Answer Comment  Walker: With 5 Inch Wheels   Patient needs a walker to treat with the following condition Weakness      03/11/23 1038

## 2023-03-12 ENCOUNTER — Other Ambulatory Visit (HOSPITAL_COMMUNITY): Payer: Self-pay

## 2023-03-12 LAB — CBC
HCT: 25.7 % — ABNORMAL LOW (ref 36.0–46.0)
Hemoglobin: 7.6 g/dL — ABNORMAL LOW (ref 12.0–15.0)
MCH: 23.9 pg — ABNORMAL LOW (ref 26.0–34.0)
MCHC: 29.6 g/dL — ABNORMAL LOW (ref 30.0–36.0)
MCV: 80.8 fL (ref 80.0–100.0)
Platelets: 224 10*3/uL (ref 150–400)
RBC: 3.18 MIL/uL — ABNORMAL LOW (ref 3.87–5.11)
RDW: 17.5 % — ABNORMAL HIGH (ref 11.5–15.5)
WBC: 11.7 10*3/uL — ABNORMAL HIGH (ref 4.0–10.5)
nRBC: 0 % (ref 0.0–0.2)

## 2023-03-12 LAB — GLUCOSE, CAPILLARY
Glucose-Capillary: 129 mg/dL — ABNORMAL HIGH (ref 70–99)
Glucose-Capillary: 132 mg/dL — ABNORMAL HIGH (ref 70–99)
Glucose-Capillary: 154 mg/dL — ABNORMAL HIGH (ref 70–99)
Glucose-Capillary: 120 mg/dL — ABNORMAL HIGH (ref 70–99)

## 2023-03-12 LAB — PREPARE RBC (CROSSMATCH)

## 2023-03-12 LAB — POCT I-STAT, CHEM 8
BUN: 11 mg/dL (ref 8–23)
Calcium, Ion: 1.26 mmol/L (ref 1.15–1.40)
Chloride: 107 mmol/L (ref 98–111)
Creatinine, Ser: 0.6 mg/dL (ref 0.44–1.00)
Glucose, Bld: 90 mg/dL (ref 70–99)
HCT: 51 % — ABNORMAL HIGH (ref 36.0–46.0)
Hemoglobin: 17.3 g/dL — ABNORMAL HIGH (ref 12.0–15.0)
Potassium: 3.7 mmol/L (ref 3.5–5.1)
Sodium: 141 mmol/L (ref 135–145)
TCO2: 22 mmol/L (ref 22–32)

## 2023-03-12 LAB — HEMOGLOBIN AND HEMATOCRIT, BLOOD
HCT: 29 % — ABNORMAL LOW (ref 36.0–46.0)
Hemoglobin: 9.1 g/dL — ABNORMAL LOW (ref 12.0–15.0)

## 2023-03-12 MED ORDER — ASPIRIN 81 MG PO TBEC
81.0000 mg | DELAYED_RELEASE_TABLET | Freq: Every day | ORAL | 12 refills | Status: DC
  Filled 2023-03-12: qty 30, 30d supply, fill #0

## 2023-03-12 MED ORDER — PANTOPRAZOLE SODIUM 40 MG PO TBEC
40.0000 mg | DELAYED_RELEASE_TABLET | Freq: Every day | ORAL | 0 refills | Status: DC
  Filled 2023-03-12: qty 30, 30d supply, fill #0

## 2023-03-12 MED ORDER — ROSUVASTATIN CALCIUM 20 MG PO TABS
20.0000 mg | ORAL_TABLET | Freq: Every day | ORAL | 11 refills | Status: DC
  Filled 2023-03-12: qty 30, 30d supply, fill #0

## 2023-03-12 MED ORDER — CLOPIDOGREL BISULFATE 75 MG PO TABS
75.0000 mg | ORAL_TABLET | Freq: Every day | ORAL | 0 refills | Status: DC
  Filled 2023-03-12: qty 30, 30d supply, fill #0

## 2023-03-12 MED ORDER — OXYCODONE HCL 5 MG PO TABS
5.0000 mg | ORAL_TABLET | ORAL | 0 refills | Status: DC | PRN
  Filled 2023-03-12: qty 30, 3d supply, fill #0

## 2023-03-12 MED ORDER — SODIUM CHLORIDE 0.9% IV SOLUTION: Freq: Once | INTRAVENOUS | Status: AC

## 2023-03-12 MED ORDER — PANTOPRAZOLE SODIUM 40 MG PO TBEC: 40.0000 mg | DELAYED_RELEASE_TABLET | Freq: Every day | ORAL | Status: DC

## 2023-03-12 NOTE — TOC Transition Note (Signed)
Transition of Care (TOC) - CM/SW Discharge Note Donn Pierini RN, BSN Transitions of Care Unit 4E- RN Case Manager See Treatment Team for direct phone #   Patient Details  Name: Theresa Harrington MRN: [id number] Date of Birth: [birth year]  Transition [facility] Care [facility]) CM/SW Contact:  Darrold Span, RN Phone Number: 03/12/2023, 10:10 AM   Clinical Narrative:    Pt stable for transition home today, Orders placed for DME- RW and BSC- per previous CM note referral sent to [employer] and DME to be delivered to room prior to discharge.   CM received notice from [facility] liaison that VVS office sent referral to them for [facility] needs. Liaison to follow post discharge, and has been updated on discharge.   Family to transport home.   No further needs noted.    Final next level of care: Home w Home Health Services Barriers to Discharge: No Barriers Identified   Patient Goals and CMS Choice CMS Medicare.gov Compare Post Acute Care list provided to:: Patient Choice offered to / list presented to : Patient  Discharge Placement                 Home w/ [facility]        Discharge Plan and Services Additional resources added to the After Visit Summary for     Discharge Planning Services: CM Consult Post Acute Care Choice: Durable Medical Equipment, Home Health          DME Arranged: Walker rolling, Bedside commode DME Agency: [employer] Date DME Agency Contacted: 03/11/23 Time DME Agency Contacted: 1030 Representative spoke with at DME Agency: Wendi Maya Agency: [facility] (VVS office referral) Date HH Agency Contacted: 03/12/23 Time HH Agency Contacted: 1010 Representative spoke with at [facility] Agency: Shelby Mattocks  Social Determinants of Health (SDOH) Interventions SDOH Screenings   Food Insecurity: No Food Insecurity (03/09/2023)  Housing: Low Risk  (03/09/2023)  Transportation Needs: No Transportation Needs (03/09/2023)  Utilities: Not At Risk (03/09/2023)   Alcohol Screen: Low Risk  (12/07/2021)  Depression (PHQ2-9): Low Risk  (09/13/2022)  Financial Resource Strain: Low Risk  (12/07/2021)  Physical Activity: Inactive (12/07/2021)  Social Connections: Moderately Integrated (12/07/2021)  Stress: No Stress Concern Present (12/07/2021)  Tobacco Use: Medium Risk (03/09/2023)     Readmission Risk Interventions    03/12/2023   10:10 AM  Readmission Risk Prevention Plan  Transportation Screening Complete  Home Care Screening Complete  Medication Review (RN CM) Complete

## 2023-03-12 NOTE — Progress Notes (Signed)
Mobility Specialist Progress Note:   03/12/23 1445  Mobility  Activity Ambulated with assistance in hallway  Level of Assistance Standby assist, set-up cues, supervision of patient - no hands on  Assistive Device Front wheel walker  Distance Ambulated (ft) 225 ft  Activity Response Tolerated well  Mobility Referral Yes  $Mobility charge 1 Mobility  Mobility Specialist Start Time (ACUTE ONLY) 1415  Mobility Specialist Stop Time (ACUTE ONLY) 1430  Mobility Specialist Time Calculation (min) (ACUTE ONLY) 15 min   Pre Mobility: 62 HR  During Mobility: 71 HR Post Mobility: 60 HR   Pt received in bed, agreeable to mobility. Denied any c/o during ambulation, asymptomatic throughout. Pt returned to bed with call bell in reach and all needs met.  Leory Plowman  Mobility Specialist Please contact via [employer] office at [phone]

## 2023-03-12 NOTE — Progress Notes (Addendum)
[facility] [place]  Subjective  - some dizziness with ambulation   Objective (!) 116/53 62 98 F (36.7 C) (Oral) 14 98%  Intake/Output Summary (Last 24 hours) at 03/12/2023 0847 Last data filed at 03/12/2023 1610 Gross per 24 hour  Intake 560 ml  Output --  Net 560 ml    PT signal via doppler, Gt wound clean and dry Incisions healing well Ambulating with SB assistance with PT in halls    Assessment/Planning: POD # 3  right fem-PT bypass hybrid PTFE/vein with right CFA endarterectomy   Inflow good with PT doppler sigal Incision healing well Symptomatic dizziness with ambulation will transfuse 1 unit of PRBC prior to D/C home Will order rolling walker for home On ASA, Plavix, and will start Xarelto today  F/U in 2-3 weeks for incision check on PA clinic  Mosetta Pigeon 03/12/2023 8:47 AM --  Laboratory Lab Results: Recent Labs    03/10/23 0412 03/12/23 0316  WBC 11.9* 11.7*  HGB 8.3* 7.6*  HCT 26.9* 25.7*  PLT 223 224   BMET Recent Labs    03/09/23 1100 03/09/23 1432 03/09/23 1437 03/09/23 1612 03/10/23 0412  NA 140 141   < > 142 139  K 3.9 3.7   < > 3.7 4.0  CL 107 107  --   --  109  CO2 26  --   --   --  23  GLUCOSE 102* 90  --   --  196*  BUN 10 11  --   --  12  CREATININE 0.81 0.60  --   --  0.79  CALCIUM 9.1  --   --   --  8.7*   < > = values in this interval not displayed.    COAG Lab Results  Component Value Date   INR 1.0 03/09/2023   INR 1.3 (H) 03/08/2020   INR 1.05 07/16/2014   No results found for: "PTT"  Acute blood loss anemia:  will give 1 unit pRBC today and d/c home WB

## 2023-03-12 NOTE — Discharge Instructions (Signed)
 [facility] [place]  Discharge instructions  Lower Extremity Bypass Surgery  Please refer to the following instruction for your post-procedure care. Your surgeon or physician assistant will discuss any changes with you.  Activity  You are encouraged to walk as much as you can. You can slowly return to normal activities during the month after your surgery. Avoid strenuous activity and heavy lifting until your doctor tells you it's OK. Avoid activities such as vacuuming or swinging a golf club. Do not drive until your doctor give the OK and you are no longer taking prescription pain medications. It is also normal to have difficulty with sleep habits, eating and bowel movement after surgery. These will go away with time.  Bathing/Showering  You may shower after you go home. Do not soak in a bathtub, hot tub, or swim until the incision heals completely.  Incision Care  Clean your incision with mild soap and water. Shower every day. Pat the area dry with a clean towel. You do not need a bandage unless otherwise instructed. Do not apply any ointments or creams to your incision. If you have open wounds you will be instructed how to care for them or a visiting nurse may be arranged for you. If you have staples or sutures along your incision they will be removed at your post-op appointment. You may have skin glue on your incision. Do not peel it off. It will come off on its own in about one week. If you have a great deal of moisture in your groin, use a gauze help keep this area dry.  Diet  Resume your normal diet. There are no special food restrictions following this procedure. A low fat/ low cholesterol diet is recommended for all patients with vascular disease. In order to heal from your surgery, it is CRITICAL to get adequate nutrition. Your body requires vitamins, minerals, and protein. Vegetables are the best source of vitamins and minerals. Vegetables also provide the  perfect balance of protein. Processed food has little nutritional value, so try to avoid this.  Medications  Resume taking all your medications unless your doctor or nurse practitioner tells you not to. If your incision is causing pain, you may take over-the-counter pain relievers such as acetaminophen (Tylenol). If you were prescribed a stronger pain medication, please aware these medication can cause nausea and constipation. Prevent nausea by taking the medication with a snack or meal. Avoid constipation by drinking plenty of fluids and eating foods with high amount of fiber, such as fruits, vegetables, and grains. Take Colase 100 mg (an over-the-counter stool softener) twice a day as needed for constipation. Do not take Tylenol if you are taking prescription pain medications.  Follow Up  Our office will schedule a follow up appointment 2-3 weeks following discharge.  Please call us immediately for any of the following conditions  Severe or worsening pain in your legs or feet while at rest or while walking Increase pain, redness, warmth, or drainage (pus) from your incision site(s) Fever of 101 degree or higher The swelling in your leg with the bypass suddenly worsens and becomes more painful than when you were in the hospital If you have been instructed to feel your graft pulse then you should do so every day. If you can no longer feel this pulse, call the office immediately. Not all patients are given this instruction.  Leg swelling is common after leg bypass surgery.  The swelling should improve over a few months   following surgery. To improve the swelling, you may elevate your legs above the level of your heart while you are sitting or resting. Your surgeon or physician assistant may ask you to apply an ACE wrap or wear compression (TED) stockings to help to reduce swelling.  Reduce your risk of vascular disease  Stop smoking. If you would like help call [facility] at 1-800-QUIT-NOW  (1-800-784-8669) or [place] at [phone].  Manage your cholesterol Maintain a desired weight Control your diabetes weight Control your diabetes Keep your blood pressure down  If you have any questions, please call the office at [phone]   

## 2023-03-12 NOTE — Progress Notes (Signed)
OT Cancellation Note  Patient Details Name: Theresa Harrington MRN: [id number] DOB: 1955/12/17   Cancelled Treatment:    Reason Eval/Treat Not Completed: Patient declined, no reason specified Pt reported no plans to "do anything right now" when OT session initiated and denied concerns.  Lorre Munroe 03/12/2023, 11:29 AM

## 2023-03-12 NOTE — Plan of Care (Signed)
  Problem: Education: Goal: Knowledge of General Education information will improve Description: Including pain rating scale, medication(s)/side effects and non-pharmacologic comfort measures Outcome: Progressing   Problem: Health Behavior/Discharge Planning: Goal: Ability to manage health-related needs will improve Outcome: Progressing   Problem: Clinical Measurements: Goal: Ability to maintain clinical measurements within normal limits will improve Outcome: Progressing Goal: Will remain free from infection Outcome: Progressing Goal: Diagnostic test results will improve Outcome: Progressing Goal: Cardiovascular complication will be avoided Outcome: Progressing   Problem: Activity: Goal: Risk for activity intolerance will decrease Outcome: Progressing   Problem: Nutrition: Goal: Adequate nutrition will be maintained Outcome: Progressing   Problem: Elimination: Goal: Will not experience complications related to bowel motility Outcome: Progressing Goal: Will not experience complications related to urinary retention Outcome: Progressing   Problem: Pain Management: Goal: General experience of comfort will improve Outcome: Progressing   Problem: Safety: Goal: Ability to remain free from injury will improve Outcome: Progressing   Problem: Skin Integrity: Goal: Risk for impaired skin integrity will decrease Outcome: Progressing   Problem: Education: Goal: Knowledge of prescribed regimen will improve Outcome: Progressing   Problem: Activity: Goal: Ability to tolerate increased activity will improve Outcome: Progressing   Problem: Bowel/Gastric: Goal: Gastrointestinal status for postoperative course will improve Outcome: Progressing   Problem: Clinical Measurements: Goal: Postoperative complications will be avoided or minimized Outcome: Progressing Goal: Signs and symptoms of graft occlusion will improve Outcome: Progressing   Problem: Skin Integrity: Goal:  Demonstration of wound healing without infection will improve Outcome: Progressing

## 2023-03-12 NOTE — Progress Notes (Signed)
Physical Therapy Treatment Patient Details Name: Theresa Harrington MRN: [id number] DOB: 05-23-1955 Today's Date: 03/12/2023   History of Present Illness Pt is a 67 yo female admitted 11/8 with foot wounds for scheduled  Right fem-PT bypass hybrid PTFE/vein with right CFA endarterectomy. PMH: HTN, HLD, GERD, PAD, PAF, T2DM, anemia, PAF, clotting disorder, hypothyroidism    PT Comments  Pt pleasant and willing for mobility. Pt was seated EOB at beginning of session, STS and ambulation completed with supervision. Pt required cueing for weightbearing through heel and step through gait pattern. Pt increased ambulation distance with less assistance, further tolerated LE exercises. Will continue to follow acutely.    If plan is discharge home, recommend the following: A little help with walking and/or transfers;Assist for transportation;A little help with bathing/dressing/bathroom   Can travel by private vehicle        Equipment Recommendations  Rolling walker (2 wheels)    Recommendations for Other Services       Precautions / Restrictions Precautions Precautions: Fall Restrictions Weight Bearing Restrictions: No     Mobility  Bed Mobility               General bed mobility comments: Seated EOB at beginning of session (Simultaneous filing. User may not have seen previous data.)    Transfers Overall transfer level: Needs assistance Equipment used: Rolling walker (2 wheels) Transfers: Sit to/from Stand Sit to Stand: Supervision   Step pivot transfers: Supervision       General transfer comment: min cueing for hand placement not pulling on walker    Ambulation/Gait Ambulation/Gait assistance: Supervision Gait Distance (Feet): 250 Feet Assistive device: Rolling walker (2 wheels) Gait Pattern/deviations: Step-through pattern, Decreased step length - right, Decreased dorsiflexion - right, Decreased stance time - right   Gait velocity interpretation: 1.31 - 2.62 ft/sec,  indicative of limited community ambulator   General Gait Details: cueing to initiate hell contact, kne flexion, and step through gait pattern.   Stairs             Wheelchair Mobility     Tilt Bed    Modified Rankin (Stroke Patients Only)       Balance Overall balance assessment: Needs assistance Sitting-balance support: Feet supported, Feet unsupported, No upper extremity supported Sitting balance-Leahy Scale: Good     Standing balance support: Bilateral upper extremity supported, During functional activity, Reliant on assistive device for balance Standing balance-Leahy Scale: Fair Standing balance comment: pt able to step backwards without RW 3 steps to reach chair and lower self to seated                            Cognition Arousal: Alert Behavior During Therapy: WFL for tasks assessed/performed Overall Cognitive Status: Within Functional Limits for tasks assessed                                          Exercises General Exercises - Lower Extremity Ankle Circles/Pumps: AROM, Both, 20 reps, Seated Long Arc Quad: AROM, Both, 20 reps, Seated Toe Raises: AROM, Both, 20 reps, Seated    General Comments        Pertinent Vitals/Pain Pain Assessment Pain Assessment: Faces Faces Pain Scale: Hurts a little bit Pain Location: R LE at incision Pain Descriptors / Indicators: Discomfort Pain Intervention(s): Limited activity within patient's tolerance, Monitored during session  Home Living                          Prior Function            PT Goals (current goals can now be found in the care plan section) Acute Rehab PT Goals Patient Stated Goal: Return to home and work PT Goal Formulation: With patient Time For Goal Achievement: 03/24/23 Potential to Achieve Goals: Good Progress towards PT goals: Progressing toward goals    Frequency    Min 1X/week      PT Plan      Co-evaluation               AM-PAC PT "6 Clicks" Mobility   Outcome Measure  Help needed turning from your back to your side while in a flat bed without using bedrails?: None Help needed moving from lying on your back to sitting on the side of a flat bed without using bedrails?: None Help needed moving to and from a bed to a chair (including a wheelchair)?: A Little Help needed standing up from a chair using your arms (e.g., wheelchair or bedside chair)?: A Little Help needed to walk in hospital room?: A Little Help needed climbing 3-5 steps with a railing? : A Little 6 Click Score: 20    End of Session   Activity Tolerance: Patient tolerated treatment well Patient left: in chair;with call bell/phone within reach Nurse Communication: Mobility status PT Visit Diagnosis: Unsteadiness on feet (R26.81);Other abnormalities of gait and mobility (R26.89)     Time: 2440-1027 PT Time Calculation (min) (ACUTE ONLY): 17 min  Charges:    $Gait Training: 8-22 mins PT General Charges $$ ACUTE PT VISIT: 1 Visit                     Andrey Farmer SPT Secure chat preferred    Darlin Drop 03/12/2023, 9:49 AM

## 2023-03-13 ENCOUNTER — Other Ambulatory Visit (HOSPITAL_COMMUNITY): Payer: Self-pay

## 2023-03-13 ENCOUNTER — Other Ambulatory Visit: Payer: Self-pay | Admitting: Pharmacist

## 2023-03-13 ENCOUNTER — Telehealth (HOSPITAL_COMMUNITY): Payer: Self-pay | Admitting: Pharmacy Technician

## 2023-03-13 LAB — TYPE AND SCREEN
ABO/RH(D): A POS
Antibody Screen: NEGATIVE
Unit division: 0
Unit division: 0

## 2023-03-13 LAB — CBC
HCT: 29.5 % — ABNORMAL LOW (ref 36.0–46.0)
Hemoglobin: 9 g/dL — ABNORMAL LOW (ref 12.0–15.0)
MCH: 24.9 pg — ABNORMAL LOW (ref 26.0–34.0)
MCHC: 30.5 g/dL (ref 30.0–36.0)
MCV: 81.7 fL (ref 80.0–100.0)
Platelets: 231 10*3/uL (ref 150–400)
RBC: 3.61 MIL/uL — ABNORMAL LOW (ref 3.87–5.11)
RDW: 16.8 % — ABNORMAL HIGH (ref 11.5–15.5)
WBC: 11.8 10*3/uL — ABNORMAL HIGH (ref 4.0–10.5)
nRBC: 0 % (ref 0.0–0.2)

## 2023-03-13 LAB — GLUCOSE, CAPILLARY: Glucose-Capillary: 116 mg/dL — ABNORMAL HIGH (ref 70–99)

## 2023-03-13 LAB — BPAM RBC
Blood Product Expiration Date: 202411272359
Blood Product Expiration Date: 202411272359
ISSUE DATE / TIME: 202411120656
PRODUCT CODE: 202411111028
Unit Type and Rh: 6200
Unit Type and Rh: 202411272359
Unit Type and Rh: 202411272359
Unit Type and Rh: 6200

## 2023-03-13 MED ORDER — ONDANSETRON HCL 4 MG PO TABS
4.0000 mg | ORAL_TABLET | Freq: Three times a day (TID) | ORAL | 0 refills | Status: DC | PRN
  Filled 2023-03-13: qty 20, 7d supply, fill #0

## 2023-03-13 MED ORDER — ONDANSETRON HCL 4 MG PO TABS
4.0000 mg | ORAL_TABLET | Freq: Every day | ORAL | 1 refills | Status: DC | PRN
  Filled 2023-03-13: qty 30, 30d supply, fill #0

## 2023-03-13 NOTE — Telephone Encounter (Signed)
Pharmacy Patient Advocate Encounter   Received notification that prior authorization for Ondansetron HCl 4MG  tabletsis required/requested.   Insurance verification completed.   The patient is insured through [facility] AD[employer]/RX ADV[employer] .   Per test claim: PA required; PA submitted to above mentioned insurance via CoverMyMeds Key/confirmation #/EOC [id number] Status is pending

## 2023-03-13 NOTE — Progress Notes (Signed)
Occupational Therapy Treatment Patient Details Name: Theresa Harrington MRN: [id number] DOB: 1955/10/06 Today's Date: 03/13/2023   History of present illness Pt is a 67 yo female admitted 11/8 with foot wounds for scheduled  Right fem-PT bypass hybrid PTFE/vein with right CFA endarterectomy. PMH: HTN, HLD, GERD, PAD, PAF, T2DM, anemia, PAF, clotting disorder, hypothyroidism   OT comments  Pt with good progress toward established OT goals. Pt most limited this session by tightness and edema in RLE affecting gait pattern (cued to optimize during session), and nausea with longer bouts of OOB mobility. Pt requiring supervision for ADL this session. Introduced Teaching laboratory technician and pt with good understanding and able to identify strategies she feels confident implementing at home.       If plan is discharge home, recommend the following:  A little help with walking and/or transfers;A little help with bathing/dressing/bathroom;Assistance with cooking/housework;Assist for transportation;Help with stairs or ramp for entrance   Equipment Recommendations  Other (comment) (RW)    Recommendations for Other Services      Precautions / Restrictions Precautions Precautions: Fall Restrictions Weight Bearing Restrictions: No       Mobility Bed Mobility               General bed mobility comments: OOB with RW on arrival.    Transfers Overall transfer level: Needs assistance Equipment used: Rolling walker (2 wheels) Transfers: Sit to/from Stand Sit to Stand: Supervision           General transfer comment: approaching mod I     Balance Overall balance assessment: Needs assistance Sitting-balance support: Feet supported, Feet unsupported, No upper extremity supported Sitting balance-Leahy Scale: Good     Standing balance support: Bilateral upper extremity supported, During functional activity, Reliant on assistive device for balance Standing balance-Leahy Scale: Fair                              ADL either performed or assessed with clinical judgement   ADL Overall ADL's : Needs assistance/impaired     Grooming: Supervision/safety Grooming Details (indicate cue type and reason): approaching mod I             Lower Body Dressing: Supervision/safety;Sit to/from stand   Toilet Transfer: Supervision/safety;Rolling walker (2 wheels) Toilet Transfer Details (indicate cue type and reason): for safety. Cues for heel strike and step through pattern during gait; favoring RLE throughout         Functional mobility during ADLs: Supervision/safety;Rolling walker (2 wheels) General ADL Comments: decreased activity tolerance. Introduced Fish farm manager of energy conservation and initiated conversation how it applies in pt own personal conext. Pt verbalizing understanding and identifying strategies she feels confident using at home.    Extremity/Trunk Assessment Upper Extremity Assessment Upper Extremity Assessment: Generalized weakness;Right hand dominant   Lower Extremity Assessment Lower Extremity Assessment: Defer to PT evaluation        Vision   Vision Assessment?: No apparent visual deficits   Perception     Praxis      Cognition Arousal: Alert Behavior During Therapy: WFL for tasks assessed/performed Overall Cognitive Status: Within Functional Limits for tasks assessed                                 General Comments: AAOx4 and demonstrates ability to follow multi-step directions consistently        Exercises  Shoulder Instructions       General Comments VSS on RA    Pertinent Vitals/ Pain       Pain Assessment Pain Assessment: Faces Faces Pain Scale: Hurts a little bit Pain Location: R LE at incision Pain Descriptors / Indicators: Discomfort Pain Intervention(s): Limited activity within patient's tolerance, Monitored during session  Home Living                                           Prior Functioning/Environment              Frequency  Min 1X/week        Progress Toward Goals  OT Goals(current goals can now be found in the care plan section)  Progress towards OT goals: Progressing toward goals  Acute Rehab OT Goals Patient Stated Goal: go home OT Goal Formulation: With patient Time For Goal Achievement: 03/24/23 Potential to Achieve Goals: Good ADL Goals Pt Will Perform Grooming: with modified independence;standing Pt Will Perform Lower Body Bathing: with modified independence;sitting/lateral leans;sit to/from stand Pt Will Perform Lower Body Dressing: with modified independence;sitting/lateral leans;sit to/from stand Pt Will Perform Toileting - Clothing Manipulation and hygiene: with modified independence;sit to/from stand;sitting/lateral leans Pt Will Perform Tub/Shower Transfer: with modified independence;ambulating;shower seat (with least restrictive AD) Pt/caregiver will Perform Home Exercise Program: Increased strength;Both right and left upper extremity;With theraband;With theraputty;Independently;With written HEP provided Additional ADL Goal #1: Patient will demonstrate ability to Independently state 4 energy conservation strategies to increase safety and independence with functional tasks.  Plan      Co-evaluation                 AM-PAC OT "6 Clicks" Daily Activity     Outcome Measure   Help from another person eating meals?: None Help from another person taking care of personal grooming?: None Help from another person toileting, which includes using toliet, bedpan, or urinal?: A Little Help from another person bathing (including washing, rinsing, drying)?: A Little Help from another person to put on and taking off regular upper body clothing?: None Help from another person to put on and taking off regular lower body clothing?: A Little 6 Click Score: 21    End of Session Equipment Utilized During Treatment: Rolling walker (2  wheels)  OT Visit Diagnosis: Unsteadiness on feet (R26.81);Other abnormalities of gait and mobility (R26.89);Muscle weakness (generalized) (M62.81);Other (comment) (decreased activity tolerance.)   Activity Tolerance Patient tolerated treatment well   Patient Left in bed;with call bell/phone within reach   Nurse Communication Mobility status;Other (comment) (Pt with nausea with longer bouts of mobility; asking for a few alcohol swabs to take home to help manage this in her natural context)        Time: 1610-9604 OT Time Calculation (min): 22 min  Charges: OT General Charges $OT Visit: 1 Visit OT Treatments $Self Care/Home Management : 8-22 mins  Tyler Deis, OTR/L [facility] Acute Rehabilitation Office: [phone]   Theresa Harrington 03/13/2023, 9:05 AM

## 2023-03-13 NOTE — Progress Notes (Signed)
   03/13/2023 Name: Theresa Harrington MRN: [id number] DOB: 28-Mar-1956  Chief Complaint  Patient presents with   Medication Access   Called patient to follow up on Synjardy and Basaglar applications that were faxed 02/20/2023.  Spoke to patient who states she has received Basaglar through PAP but has not heard anything about Synjardy.  Called [employer] for Star assistance. Pt has been approved through 04/30/2024. I was unable to confirm shipment date due to long hold times. Called the patient to inform her. Left detailed VM with [employer] phone number if she needs to call for shipment status.   Arbutus Leas, PharmD, BCPS Clinical Pharmacist [facility] [phone]

## 2023-03-13 NOTE — Care Management Important Message (Signed)
Important Message  Patient Details  Name: Theresa Harrington MRN: [id number] Date of Birth: 02-10-1956   Important Message Given:  Yes - Medicare IM     Dorena Bodo 03/13/2023, 1:01 PM

## 2023-03-13 NOTE — Progress Notes (Signed)
Mobility Specialist Progress Note:   03/13/23 1151  Mobility  Activity Ambulated with assistance in hallway  Level of Assistance Standby assist, set-up cues, supervision of patient - no hands on  Assistive Device Front wheel walker  Distance Ambulated (ft) 200 ft  Activity Response Tolerated well  Mobility Referral Yes  $Mobility charge 1 Mobility  Mobility Specialist Start Time (ACUTE ONLY) 1126  Mobility Specialist Stop Time (ACUTE ONLY) 1136  Mobility Specialist Time Calculation (min) (ACUTE ONLY) 10 min   Pt received in bed, agreeable to mobility. C/o RLE stiffness upon standing, otherwise asx throughout. Pt returned to bed with all needs met.   Leory Plowman  Mobility Specialist Please contact via [employer] office at [phone]

## 2023-03-13 NOTE — Progress Notes (Signed)
[facility]  Subjective  - no new complaints N/V last night x 1 event small.  No nausea this am.   Objective (!) 120/55 60 98.3 F (36.8 C) (Oral) 18 98%  Intake/Output Summary (Last 24 hours) at 03/13/2023 1610 Last data filed at 03/12/2023 1340 Gross per 24 hour  Intake 315 ml  Output --  Net 315 ml    Right LE no drainage actively Doppler brisk PT, weaker DP right LE Minimal edema at the ankle, motor intact Lungs non labored breathing    Assessment/Planning: POD # 4 right fem-PT bypass hybrid PTFE/vein with right CFA endarterectomy    Discharge home  today improved HGB 9.0 post op blood loss anemia s/p 1 unit PRBC.  Asymptomatic for dizziness when ambulating after receiving PRBC  F/U arranged  Will add Zofran for home prn for nausea   Mosetta Pigeon 03/13/2023 7:13 AM --  Laboratory Lab Results: Recent Labs    03/12/23 0316 03/12/23 1610 03/13/23 0506  WBC 11.7*  --  11.8*  HGB 7.6* 9.1* 9.0*  HCT 25.7* 29.0* 29.5*  PLT 224  --  231   BMET No results for input(s): "NA", "K", "CL", "CO2", "GLUCOSE", "BUN", "CREATININE", "CALCIUM" in the last 72 hours.  COAG Lab Results  Component Value Date   INR 1.0 03/09/2023   INR 1.3 (H) 03/08/2020   INR 1.05 07/16/2014   No results found for: "PTT"

## 2023-03-14 ENCOUNTER — Telehealth: Payer: Self-pay | Admitting: *Deleted

## 2023-03-14 DIAGNOSIS — E785 Hyperlipidemia, unspecified: Secondary | ICD-10-CM | POA: Diagnosis not present

## 2023-03-14 DIAGNOSIS — I739 Peripheral vascular disease, unspecified: Secondary | ICD-10-CM | POA: Diagnosis not present

## 2023-03-14 DIAGNOSIS — Z7901 Long term (current) use of anticoagulants: Secondary | ICD-10-CM | POA: Diagnosis not present

## 2023-03-14 DIAGNOSIS — Z7985 Long-term (current) use of injectable non-insulin antidiabetic drugs: Secondary | ICD-10-CM | POA: Diagnosis not present

## 2023-03-14 DIAGNOSIS — Z48812 Encounter for surgical aftercare following surgery on the circulatory system: Secondary | ICD-10-CM | POA: Diagnosis not present

## 2023-03-14 DIAGNOSIS — K219 Gastro-esophageal reflux disease without esophagitis: Secondary | ICD-10-CM | POA: Diagnosis not present

## 2023-03-14 DIAGNOSIS — Z7902 Long term (current) use of antithrombotics/antiplatelets: Secondary | ICD-10-CM | POA: Diagnosis not present

## 2023-03-14 DIAGNOSIS — Z7984 Long term (current) use of oral hypoglycemic drugs: Secondary | ICD-10-CM | POA: Diagnosis not present

## 2023-03-14 DIAGNOSIS — L97528 Non-pressure chronic ulcer of other part of left foot with other specified severity: Secondary | ICD-10-CM | POA: Diagnosis not present

## 2023-03-14 DIAGNOSIS — L97518 Non-pressure chronic ulcer of other part of right foot with other specified severity: Secondary | ICD-10-CM | POA: Diagnosis not present

## 2023-03-14 DIAGNOSIS — Z556 Problems related to health literacy: Secondary | ICD-10-CM | POA: Diagnosis not present

## 2023-03-14 DIAGNOSIS — I10 Essential (primary) hypertension: Secondary | ICD-10-CM | POA: Diagnosis not present

## 2023-03-14 DIAGNOSIS — Z7982 Long term (current) use of aspirin: Secondary | ICD-10-CM | POA: Diagnosis not present

## 2023-03-14 NOTE — Transitions of Care (Post Inpatient/ED Visit) (Signed)
03/14/2023  Name: Theresa Harrington MRN: [id number] DOB: [birth year]  Today's TOC FU Call Status: Today's TOC FU Call Status:: Successful TOC FU Call Completed TOC FU Call Complete Date: 03/14/23 Patient's Name and Date of Birth confirmed.  Transition Care Management Follow-up Telephone Call Date of Discharge: 03/13/23 Discharge Facility: [facility]) Type of Discharge: Inpatient Admission Primary Inpatient Discharge Diagnosis:: (R) fem tib surgical bypass How have you been since you were released from the hospital?: Better ("I had a good night; I live by myself but there are people in and out all day checking on me.  I am independent.  So far so good") Any questions or concerns?: Yes Patient Questions/Concerns:: "I have a tiny little bit of oozing at the incision site, I don't think it is anything to worry about-- but if it keeps up or gets worse, I will call the office for the surgeon as you have advised.  I will also show the home health nurse when she gets here today" Patient Questions/Concerns Addressed: Other: (confirmed patient has home health RN visit scheduled for today; confirmed she has office number for the surgeon's office-- advised her to call for ongoing or worsening oozing from incision site)  Items Reviewed: Did you receive and understand the discharge instructions provided?: Yes (thoroughly reviewed with patient who verbalizes good understanding of same) Medications obtained,verified, and reconciled?: Yes (Medications Reviewed) (Full medication reconciliation/ review completed; no concerns or discrepancies identified; confirmed patient obtained/ is taking all newly Rx'd medications as instructed; self-manages medications and denies questions/ concerns around medications today) Any new allergies since your discharge?: No Dietary orders reviewed?: Yes Type of Diet Ordered:: "Healthy as possible" Do you have support at home?: Yes People in Home: alone Name of Support/Comfort  Primary Source: Reports independent in self-care activities; supportive friends assists as/ if needed/ indicated  Medications Reviewed Today: Medications Reviewed Today     Reviewed by Michaela Corner, RN (Registered Nurse) on 03/14/23 at 1029  Med List Status: <None>   Medication Order Taking? Sig Documenting Provider Last Dose Status Informant  aspirin EC 81 MG tablet [id number] Yes Take 1 tablet (81 mg total) by mouth daily. Swallow whole. Lars Mage, PA-C Taking Active   Blood Glucose Monitoring Suppl [facility] VERIO IQ SYSTEM) w/Device Andria Rhein [id number] Yes USE TO CHECK SUGAR DAILY Etta Grandchild, MD Taking Active Self  cetirizine (ZYRTEC) 10 MG tablet [id number] Yes Take 10 mg by mouth daily as needed for allergies. [provider] Taking Active Self  clopidogrel (PLAVIX) 75 MG tablet [id number] Yes Take 1 tablet (75 mg total) by mouth daily at 6 (six) AM. Lars Mage, PA-C Taking Active   diltiazem (CARDIZEM CD) 180 MG 24 hr capsule [id number] Yes Take 1 capsule (180 mg total) by mouth daily. Jodelle Gross, NP Taking Active Self  diltiazem (CARDIZEM) 30 MG tablet [id number] Yes Take 1 tablet every 4 hours AS NEEDED for AFIB heart rate >100 as long as top BP >100. Jodelle Gross, NP Taking Active Self  dorzolamide-timolol (COSOPT) 22.3-6.8 MG/ML ophthalmic solution [id number] Yes Place 1 drop into the right eye 2 (two) times daily.  [provider] Taking Active Self           Med Note Rich Number Feb 28, 2023  2:54 PM) Confirmed taking  doxycycline (ADOXA) 100 MG tablet [id number] No Take 50 mg by mouth 2 (two) times daily.  Patient not taking: Reported on 03/14/2023   [provider]  Not Taking Active Self           Med Note Otelia Limes Mar 14, 2023 10:03 AM) 03/14/23: Reports during [facility] call she is no longer taking after hospital discharge on 03/13/23  Empagliflozin-metFORMIN HCl (SYNJARDY) 5-500 MG TABS [id number] Yes Take 1  tablet by mouth 2 (two) times daily. Etta Grandchild, MD Taking Active Self           Med Note Otelia Limes Mar 14, 2023 10:29 AM) 03/14/23: reports during [facility] call needs refill-- states she will call her outpatient pharmacy to request refill  ezetimibe (ZETIA) 10 MG tablet [id number] Yes Take 1 tablet (10 mg total) by mouth daily. Etta Grandchild, MD Taking Active Self  HUMALOG KWIKPEN 100 UNIT/ML KwikPen [id number] Yes DIAL 14 UNITS AND INJECT UNDER THE SKIN THREE TIMES A DAY. Etta Grandchild, MD Taking Active Self  hydrALAZINE (APRESOLINE) 50 MG tablet [id number] Yes Take 1 tablet (50 mg total) by mouth 3 (three) times daily.  Patient taking differently: Take 50 mg by mouth 2 (two) times daily.   Etta Grandchild, MD Taking Active Self  Insulin Glarg[facility]) 100 UNIT/ML [id number] Yes Inject 60 Units into the skin daily.  Patient taking differently: Inject 50 Units into the skin at bedtime.   Etta Grandchild, MD Taking Active Self           Med Note Eldred Manges, Ebony Cargo Mar 06, 2023  7:26 AM) Took 25 units  Insulin Pen Needle (PEN NEEDLES) 32G X 4 MM MISC [id number] Yes Inject 1 pen  as directed 4 (four) times daily. Use to inject levemir or novolog as directed. Etta Grandchild, MD Taking Active Self  latanoprost (XALATAN) 0.005 % ophthalmic solution [id number] Yes Place 1 drop into both eyes at bedtime.  [provider] Taking Active Self           Med Note Rich Number Feb 28, 2023  2:54 PM) Confirmed taking  MAGnesium-Oxide 400 (240 Mg) MG tablet [id number] Yes Take 1 tablet (400 mg total) by mouth daily. Etta Grandchild, MD Taking Active Self  nebivolol (BYSTOLIC) 10 MG tablet [id number] Yes Take 1 tablet by mouth once daily Jake Bathe, MD Taking Active Self  ondansetron (ZOFRAN) 4 MG tablet [id number] Yes Take 1 tablet (4 mg total) by mouth every 8 (eight) hours as needed for nausea or vomiting. Lars Mage, PA-C Taking Active   ondansetron  [facility]) 4 MG tablet [id number] Yes Take 1 tablet (4 mg total) by mouth daily as needed for nausea or vomiting. Lars Mage, PA-C Taking Active   [facility] VERIO test strip [id number] Yes USE TO check blood glucose TWICE DAILY Etta Grandchild, MD Taking Active Self  oxyCODONE (OXY IR/ROXICODONE) 5 MG immediate release tablet [id number] Yes Take 1-2 tablets (5-10 mg total) by mouth every 4 (four) hours as needed for moderate pain (pain score 4-6). Clinton Gallant M, PA-C Taking Active   pantoprazole (PROTONIX) 40 MG tablet [id number] Yes Take 1 tablet (40 mg total) by mouth daily. Lars Mage, PA-C Taking Active            Med Note Otelia Limes Mar 14, 2023 10:09 AM) 03/14/23: Reports during TOC call she is currently taking omeprazole BID- not pantoprazole  rosuvastatin (CRESTOR) 5 MG tablet [id number] Yes Take 1 tablet (5 mg total) by mouth daily. Skains,  Veverly Fells, MD Taking Active Self  Semaglutide, 1 MG/DOSE, (OZEMPIC, 1 MG/DOSE,) 4 MG/3ML SOPN [id number] Yes Inject 1 mg into the skin once a week. Via [employer] patient assistance Etta Grandchild, MD Taking Active Self  XARELTO 20 MG TABS tablet [id number] Yes Take 1 tablet by mouth once daily Etta Grandchild, MD Taking Active Self            Home Care and Equipment/Supplies: Were Home Health Services Ordered?: Yes Name of Home Health Agency:: [facility] Has Agency set up a time to come to your home?: Yes First Home Health Visit Date: 03/14/23 Any new equipment or medical supplies ordered?: Yes (RW and BSC) Name of Medical supply agency?: [facility] Were you able to get the equipment/medical supplies?: Yes (confirmed delivered to bedside) Do you have any questions related to the use of the equipment/supplies?: No  Functional Questionnaire: Do you need assistance with bathing/showering or dressing?: No Do you need assistance with meal preparation?: No Do you need assistance with eating?: No Do you have difficulty maintaining  continence: No Do you need assistance with getting out of bed/getting out of a chair/moving?: No Do you have difficulty managing or taking your medications?: No  Follow up appointments reviewed: PCP Follow-up appointment confirmed?: NA (verified not indicated per hospital discharging provider discharge notes - verified she does not want to schedule HFU with PCP since it is not indicated/ advised post-discharge) Specialist Hospital Follow-up appointment confirmed?: Yes Date of Specialist follow-up appointment?: 04/02/23 (surgical provider--verified this is recommended time frame for follow up per hospital discharging provider notes) Follow-Up Specialty Provider:: surgical provider on 04/02/23; also has cardiology provider appointment on 03/27/23 Do you need transportation to your follow-up appointment?: No Do you understand care options if your condition(s) worsen?: Yes-patient verbalized understanding  SDOH Interventions Today    Flowsheet Row Most Recent Value  SDOH Interventions   Food Insecurity Interventions Intervention Not Indicated  Transportation Interventions Intervention Not Indicated  [reports normally drives self,  friends assisting post-recent surgery]  Health Literacy Interventions Intervention Not Indicated       Goals Addressed             This Visit's Progress    TOC 30-day Program Care Plan   On track    Current Barriers:  Recent surgery for re-vascularization of (R) fem tib with (R) great toe wound; lives alone/ independent at baseline  RNCM Clinical Goal(s):  Patient will work with the Care Management team over the next 30 days to address Transition of Care Barriers: prevention of hospital readmission in independent patient who lives alone take all medications exactly as prescribed and will call provider for medication related questions as evidenced by patient reporting of same during [facility] 30-day program outreaches attend all scheduled medical appointments:  03/27/23- cardiology provider; 04/02/23- [facility] surgical provider as evidenced by review of same during [facility] 30-day program outreaches work with home health team ([facility]) to facilitate potential needs post-hospital discharge on 03/13/23 as evidenced by collaboration as indicated and patient reporting during TOC 30-day program outreaches not experience hospital admission as evidenced by review of EMR. Hospital Admissions in last 6 months = 1- planned/ elective surgery  through collaboration with RN Care manager, provider, and care team.   Interventions: Evaluation of current treatment plan related to  self management and patient's adherence to plan as established by provider  Transitions of Care:  New goal. 03/14/23 Durable Medical Equipment (DME) needs assessed with patient/caregiver Doctor Visits  - discussed the  importance of doctor visits Post discharge activity limitations prescribed by provider reviewed Post-op wound/incision care reviewed with patient/caregiver Confirmed patient has heard from home health team with scheduled initial home visit later today 03/14/23 Full medication reconciliation/ review completed; self-manages medications and denies questions/ concerns around medications today- encouraged patient to have home health team also review medications during initial home visit scheduled later today Discussed role of home health services with importance of participation/ ongoing engagement Confirmed obtained new DME-- confirms she is using rolling walker only as needed--- reports does not require "all the time" Discussed current clinical condition: reports "things going fine so far; just a little oozing from the incision, but they told me to probably expect that;" denies specific clinical concerns today-- encouraged her to call surgeon's office promptly if oozing continues to concern her, or if it gets worse Provided patient with my direct contact information should questions/ concerns  arise prior to our next call  Patient Goals/Self-Care Activities: Participate in Transition of Care Program/Attend TOC scheduled calls Take all medications as prescribed Attend all scheduled provider appointments Call pharmacy for medication refills 3-7 days in advance of running out of medications Call provider office for new concerns or questions  Continue pacing activity as your recuperation from recent surgery continues Use assistive devices as needed to prevent falls Continue working with the home health team that is involved in your care If you believe your condition is getting worse or you have ongoing concerns around your incision- contact your care providers (doctors) promptly- reaching out to your doctor early when you have concerns can prevent you from having to go to the hospital  Follow Up Plan:  Telephone follow up appointment with care management team member scheduled for:  Wednesday, 03/21/23 at 9:45 am          Caryl Pina, RN, BSN, CCRN Alumnus RN Care Manager  Transitions of Care  [facility] [phone]: direct office

## 2023-03-14 NOTE — Telephone Encounter (Signed)
Pharmacy Patient Advocate Encounter  Received notification from [facility] [employer] that Prior Authorization for Ondansetron HCl 4MG  tablets  has been DENIED.  Full denial letter will be uploaded to the media tab. See denial reason below.

## 2023-03-14 NOTE — Discharge Summary (Signed)
[facility] Discharge Summary   Patient ID:  Theresa Harrington MRN: [id number] DOB/AGE: 66-Feb-1957 66 y.o.  Admit date: 03/09/2023 Discharge date: 03/13/23 Date of Surgery: 03/09/2023 Surgeon: Surgeon(s): Nada Libman, MD  Admission Diagnosis: Femoral-tibial bypass graft occlusion, right (HCC) [Q46.962X] PAD (peripheral artery disease) (HCC) [I73.9]  Discharge Diagnoses:  Femoral-tibial bypass graft occlusion, right (HCC) [T82.898A] PAD (peripheral artery disease) (HCC) [I73.9]  Secondary Diagnoses: Past Medical History:  Diagnosis Date   Allergy    Anxiety    Pt. denies   Arthritis    right index figer   Asymptomatic cholelithiasis    Atherosclerosis of aorta (HCC)    Cataract    Clotting disorder (HCC)    Gallstones 06/2013   GERD (gastroesophageal reflux disease)    Glaucoma    "A LITTLE BIT"   History of kidney stones    lithotrispy   Hyperlipidemia    Hypertension    Hypothyroidism    no meds   Iron deficiency anemia    PAD (peripheral artery disease) (HCC)    PAF (paroxysmal atrial fibrillation) (HCC)    Peripheral arterial occlusive disease (HCC)    lower extremities   Pneumonia    PONV (postoperative nausea and vomiting)    Right ureteral stone    Type 2 diabetes mellitus (HCC)    Type   Vitamin B 12 deficiency    Wears glasses     Procedure(s): Procedure:   #1: Right iliofemoral endarterectomy with vein patch angioplasty                       #2: Right femoral to posterior tibial artery bypass graft with composite graft (6 mm external ring PTFE proximally and saphenous vein to the posterior tibial artery)                       #3: Harvest of right saphenous vein  Discharged Condition: good  HPI: Theresa Harrington is a 67 y.o. female with a right great toe wound.  She has severe PAD.  Angio shows SFA/Pop oclusion.  Previous history of bypass on the left side which has occluded. She now has a right great toe ulcer. She did not have any  percutaneous options for revascularization because of the extensive calcific plaque in the common femoral artery as well as a long segment occlusion. She reconstitutes the anterior tibial artery however this does not perfuse the foot very well. The posterior tibial artery is small but reconstitutes at the level of the calf and does opacify the foot. She comes in today for revascularization.    Hospital Course:  Theresa Harrington is a 67 y.o. female is S/P Right Procedure(s): RIGHT FEMORAL-POSTERIOR TIBIAL ARTERY BYPASS USING X 80CM PROPATEN GRAFT VEIN PATCH ANGIOPLASTY TO RIGHT FEMORAL ARTERY She maintained a brisk PT doppler signal with improved inflow to the right foot.  The GT wound is dry without signs of infection.  She has low HBG with symptoms of dizziness during ambulation requiring transfusion PRBC x 1 unit.  She tolerated this well.  She was ambulatory with rolling walker and discharged in stable condition on Plavix, ASA and Xarelto.  Zofran was added for nausea without vomiting.     Significant Diagnostic Studies: CBC Lab Results  Component Value Date   WBC 11.8 (H) 03/13/2023   HGB 9.0 (L) 03/13/2023   HCT 29.5 (L) 03/13/2023   MCV 81.7 03/13/2023   PLT 231 03/13/2023  BMET    Component Value Date/Time   NA 139 03/10/2023 0412   NA 139 07/15/2021 0857   K 4.0 03/10/2023 0412   CL 109 03/10/2023 0412   CO2 23 03/10/2023 0412   GLUCOSE 196 (H) 03/10/2023 0412   BUN 12 03/10/2023 0412   BUN 14 07/15/2021 0857   CREATININE 0.79 03/10/2023 0412   CALCIUM 8.7 (L) 03/10/2023 0412   GFRNONAA >60 03/10/2023 0412   GFRAA 81 12/08/2019 0934   COAG Lab Results  Component Value Date   INR 1.0 03/09/2023   INR 1.3 (H) 03/08/2020   INR 1.05 07/16/2014     Disposition:  Discharge to :Home Discharge Instructions     Activity as tolerated - No restrictions   Complete by: As directed    Activity as tolerated - No restrictions   Complete by: As directed    Call MD for:   redness, tenderness, or signs of infection (pain, swelling, bleeding, redness, odor or green/yellow discharge around incision site)   Complete by: As directed    Call MD for:  redness, tenderness, or signs of infection (pain, swelling, bleeding, redness, odor or green/yellow discharge around incision site)   Complete by: As directed    Call MD for:  severe or increased pain, loss or decreased feeling  in affected limb(s)   Complete by: As directed    Call MD for:  severe or increased pain, loss or decreased feeling  in affected limb(s)   Complete by: As directed    Call MD for:  temperature >100.5   Complete by: As directed    Call MD for:  temperature >100.5   Complete by: As directed    Increase activity slowly   Complete by: As directed    Walk with assistance use walker or cane as needed   Increase activity slowly   Complete by: As directed    Walk with assistance use walker or cane as needed   May shower    Complete by: As directed    May shower    Complete by: As directed    Resume previous diet   Complete by: As directed    Resume previous diet   Complete by: As directed       Allergies as of 03/13/2023       Reactions   Amlodipine Swelling   Atorvastatin Other (See Comments)   Muscle aches    Ampicillin Nausea And Vomiting, Other (See Comments)   Codeine Nausea And Vomiting   Ask patient   Lisinopril Cough       Penicillins Nausea And Vomiting   Did it involve swelling of the face/tongue/throat, SOB, or low BP? No Did it involve sudden or severe rash/hives, skin peeling, or any reaction on the inside of your mouth or nose? No Did you need to seek medical attention at a hospital or doctor's office? No When did it last happen?      20+ years If all above answers are "NO", may proceed with cephalosporin use.   Januvia [sitagliptin] Nausea And Vomiting        Medication List     STOP taking these medications    omeprazole 40 MG capsule Commonly known as:  PRILOSEC Replaced by: pantoprazole 40 MG tablet       TAKE these medications    aspirin EC 81 MG tablet Take 1 tablet (81 mg total) by mouth daily. Swallow whole.   Basaglar KwikPen 100 UNIT/ML Inject 60 Units into  the skin daily. What changed:  how much to take when to take this   cetirizine 10 MG tablet Commonly known as: ZYRTEC Take 10 mg by mouth daily as needed for allergies.   clopidogrel 75 MG tablet Commonly known as: PLAVIX Take 1 tablet (75 mg total) by mouth daily at 6 (six) AM.   diltiazem 180 MG 24 hr capsule Commonly known as: CARDIZEM CD Take 1 capsule (180 mg total) by mouth daily.   diltiazem 30 MG tablet Commonly known as: Cardizem Take 1 tablet every 4 hours AS NEEDED for AFIB heart rate >100 as long as top BP >100.   dorzolamide-timolol 2-0.5 % ophthalmic solution Commonly known as: COSOPT Place 1 drop into the right eye 2 (two) times daily.   doxycycline 100 MG tablet Commonly known as: ADOXA Take 50 mg by mouth 2 (two) times daily.   ezetimibe 10 MG tablet Commonly known as: ZETIA Take 1 tablet (10 mg total) by mouth daily.   HumaLOG KwikPen 100 UNIT/ML KwikPen Generic drug: insulin lispro DIAL 14 UNITS AND INJECT UNDER THE SKIN THREE TIMES A DAY.   hydrALAZINE 50 MG tablet Commonly known as: APRESOLINE Take 1 tablet (50 mg total) by mouth 3 (three) times daily. What changed: when to take this   latanoprost 0.005 % ophthalmic solution Commonly known as: XALATAN Place 1 drop into both eyes at bedtime.   magnesium oxide 400 (240 Mg) MG tablet Commonly known as: MAGnesium-Oxide Take 1 tablet (400 mg total) by mouth daily.   nebivolol 10 MG tablet Commonly known as: BYSTOLIC Take 1 tablet by mouth once daily   ondansetron 4 MG tablet Commonly known as: Zofran Take 1 tablet (4 mg total) by mouth every 8 (eight) hours as needed for nausea or vomiting.   ondansetron 4 MG tablet Commonly known as: Zofran Take 1 tablet (4 mg total)  by mouth daily as needed for nausea or vomiting.   OneTouch Verio IQ System w/Device Kit USE TO CHECK SUGAR DAILY   OneTouch Verio test strip Generic drug: glucose blood USE TO check blood glucose TWICE DAILY   oxyCODONE 5 MG immediate release tablet Commonly known as: Oxy IR/ROXICODONE Take 1-2 tablets (5-10 mg total) by mouth every 4 (four) hours as needed for moderate pain (pain score 4-6).   Ozempic (1 MG/DOSE) 4 MG/3ML Sopn Generic drug: Semaglutide (1 MG/DOSE) Inject 1 mg into the skin once a week. Via [employer] patient assistance   pantoprazole 40 MG tablet Commonly known as: PROTONIX Take 1 tablet (40 mg total) by mouth daily. Replaces: omeprazole 40 MG capsule   Pen Needles 32G X 4 MM Misc Inject 1 pen  as directed 4 (four) times daily. Use to inject levemir or novolog as directed.   rosuvastatin 5 MG tablet Commonly known as: CRESTOR Take 1 tablet (5 mg total) by mouth daily.   Synjardy 5-500 MG Tabs Generic drug: Empagliflozin-metFORMIN HCl Take 1 tablet by mouth 2 (two) times daily.   Xarelto 20 MG Tabs tablet Generic drug: rivaroxaban Take 1 tablet by mouth once daily       Verbal and written Discharge instructions given to the patient. Wound care per Discharge AVS  Follow-up Information     Nada Libman, MD Follow up in 2 week(s).   Specialties: Vascular Surgery, Cardiology Why: Office will call you to arrange your appt (sent) Contact information: [address] [place] [address] [phone]         [facility] Follow  up.   Why: ([facility])- [facility] office referral for [facility] needs- they will contact you post discharge to follow up        [facility] Follow up.   Why: Rolling walker and bedside commode arranged- to be delivered to room prior to discharge                Signed: Mosetta Pigeon 03/14/2023, 9:56 AM - For VQI Registry use --- Instructions: Press F2 to tab through selections.  Delete question if not  applicable.   Post-op:  Wound infection: No  Graft infection: No  Transfusion: Yes  If yes, 1 units given New Arrhythmia: No Ipsilateral amputation: [ x] no, [ ]  Minor, [ ]  BKA, [ ]  AKA Discharge patency: [x ] Primary, [ ]  Primary assisted, [ ]  Secondary, [ ]  Occluded Patency judged by: [x ] Dopper only, [ ]  Palpable graft pulse, [ ]  Palpable distal pulse, [ ]  ABI inc. > 0.15, [ ]  Duplex  D/C Ambulatory Status: Ambulatory with Assistance  Complications: MI: [x ] No, [ ]  Troponin only, [ ]  EKG or Clinical CHF: No Resp failure: [x ] none, [ ]  Pneumonia, [ ]  Ventilator Chg in renal function: [ ]  none, [ ]  Inc. Cr > 0.5, [ ]  Temp. Dialysis, [ ]  Permanent dialysis Stroke: [x ] None, [ ]  Minor, [ ]  Major Return to OR: No  Reason for return to OR: [ ]  Bleeding, [ ]  Infection, [ ]  Thrombosis, [ ]  Revision  Discharge medications: Statin use:  Yes ASA use:  Yes Plavix use:  Yes Beta blocker use: Yes Coumadin use: No  for medical reason managed with Xarelto

## 2023-03-14 NOTE — Patient Instructions (Signed)
Visit Information  Thank you for taking time to visit with me today. Please don't hesitate to contact me if I can be of assistance to you before our next scheduled telephone appointment.  Our next appointment is by telephone on Wednesday, 03/21/23 at 9:45 am  Patient Goals/Self-Care Activities: Participate in Transition of Care Program/Attend TOC scheduled calls Take all medications as prescribed Attend all scheduled provider appointments Call pharmacy for medication refills 3-7 days in advance of running out of medications Call provider office for new concerns or questions  Continue pacing activity as your recuperation from recent surgery continues Use assistive devices as needed to prevent falls Continue working with the home health team that is involved in your care If you believe your condition is getting worse or you have ongoing concerns around your incision- contact your care providers (doctors) promptly- reaching out to your doctor early when you have concerns can prevent you from having to go to the hospital  Following is a copy of your care plan:   Goals Addressed             This Visit's Progress    TOC 30-day Program Care Plan   On track    Current Barriers:  Recent surgery for re-vascularization of (R) fem tib with (R) great toe wound; lives alone/ independent at baseline  RNCM Clinical Goal(s):  Patient will work with the Care Management team over the next 30 days to address Transition of Care Barriers: prevention of hospital readmission in independent patient who lives alone take all medications exactly as prescribed and will call provider for medication related questions as evidenced by patient reporting of same during [facility] 30-day program outreaches attend all scheduled medical appointments: 03/27/23- cardiology provider; 04/02/23- [facility] surgical provider as evidenced by review of same during [facility] 30-day program outreaches work with home health team ([facility]) to  facilitate potential needs post-hospital discharge on 03/13/23 as evidenced by collaboration as indicated and patient reporting during TOC 30-day program outreaches not experience hospital admission as evidenced by review of EMR. Hospital Admissions in last 6 months = 1- planned/ elective surgery  through collaboration with RN Care manager, provider, and care team.   Interventions: Evaluation of current treatment plan related to  self management and patient's adherence to plan as established by provider  Transitions of Care:  New goal. 03/14/23 Durable Medical Equipment (DME) needs assessed with patient/caregiver Doctor Visits  - discussed the importance of doctor visits Post discharge activity limitations prescribed by provider reviewed Post-op wound/incision care reviewed with patient/caregiver Confirmed patient has heard from home health team with scheduled initial home visit later today 03/14/23 Full medication reconciliation/ review completed; self-manages medications and denies questions/ concerns around medications today- encouraged patient to have home health team also review medications during initial home visit scheduled later today Discussed role of home health services with importance of participation/ ongoing engagement Confirmed obtained new DME-- confirms she is using rolling walker only as needed--- reports does not require "all the time" Discussed current clinical condition: reports "things going fine so far; just a little oozing from the incision, but they told me to probably expect that;" denies specific clinical concerns today-- encouraged her to call surgeon's office promptly if oozing continues to concern her, or if it gets worse Provided patient with my direct contact information should questions/ concerns arise prior to our next call  Patient Goals/Self-Care Activities: Participate in Transition of Care Program/Attend TOC scheduled calls Take all medications as  prescribed Attend all scheduled  provider appointments Call pharmacy for medication refills 3-7 days in advance of running out of medications Call provider office for new concerns or questions  Continue pacing activity as your recuperation from recent surgery continues Use assistive devices as needed to prevent falls Continue working with the home health team that is involved in your care If you believe your condition is getting worse or you have ongoing concerns around your incision- contact your care providers (doctors) promptly- reaching out to your doctor early when you have concerns can prevent you from having to go to the hospital  Follow Up Plan:  Telephone follow up appointment with care management team member scheduled for:  Wednesday, 03/21/23 at 9:45 am         Patient verbalizes understanding of instructions and care plan provided today and agrees to view in MyChart. Active MyChart status and patient understanding of how to access instructions and care plan via MyChart confirmed with patient.     Telephone follow up appointment with care management team member scheduled for:  Wednesday, 03/21/23 at 9:45 am  Please call the care guide team at [phone] if you need to cancel or reschedule your appointment.   Please if you are experiencing a Mental Health or Behavioral Health Crisis or need someone to talk to. call the Suicide and Crisis Lifeline: 988 call the [place] National Suicide Prevention Lifeline: (405)703-6875 or TTY: 954 791 8509 TTY 939 593 5168) to talk to a trained counselor call 1-800-273-TALK (toll free, 24 hour hotline) go to [facility] [address], [address] [phone]) call the [facility] Crisis Line: (931)007-1287 call 911   Caryl Pina, RN, BSN, CCRN Alumnus RN Care Manager  Transitions of Care  [facility] - Population Health  [place] [phone]: direct office

## 2023-03-16 ENCOUNTER — Other Ambulatory Visit: Payer: Self-pay | Admitting: Internal Medicine

## 2023-03-16 DIAGNOSIS — E1151 Type 2 diabetes mellitus with diabetic peripheral angiopathy without gangrene: Secondary | ICD-10-CM

## 2023-03-19 ENCOUNTER — Other Ambulatory Visit: Payer: Self-pay

## 2023-03-19 MED ORDER — PANTOPRAZOLE SODIUM 40 MG PO TBEC: 40.0000 mg | DELAYED_RELEASE_TABLET | Freq: Every day | ORAL | 1 refills | Status: DC

## 2023-03-21 ENCOUNTER — Other Ambulatory Visit: Payer: Self-pay | Admitting: *Deleted

## 2023-03-21 NOTE — Patient Instructions (Signed)
Visit Information  Thank you for taking time to visit with me today. Please don't hesitate to contact me if I can be of assistance to you before our next scheduled telephone appointment.  Our next appointment is by telephone on Wednesday 03/28/23 at 09:45 am  Following are the goals we discussed today:   Patient Goals/Self-Care Activities: Participate in Transition of Care Program/Attend [facility] scheduled calls Take all medications as prescribed Attend all scheduled provider appointments Call pharmacy for medication refills 3-7 days in advance of running out of medications Call provider office for new concerns or questions  Continue pacing activity as your recuperation from recent surgery continues Use assistive devices as/ if needed to prevent falls Continue working with the home health team that is involved in your care If you believe your condition is getting worse or you have concerns around your condition- contact your care providers (doctors) promptly- reaching out to your doctor early when you have concerns can prevent you from having to go to the hospital Continue working with Pharmacist team at your PCP office to have your questions around your Patient Assistance Approval answered  Please call the care guide team at [phone] if you need to cancel or reschedule your appointment.   If you are experiencing a Mental Health or Behavioral Health Crisis or need someone to talk to, please  call the Suicide and Crisis Lifeline: 988 call the [place] National Suicide Prevention Lifeline: (260)368-0796 or TTY: 646-854-0533 TTY 463-548-1128) to talk to a trained counselor call 1-800-273-TALK (toll free, 24 hour hotline) go to [facility] [address] [place] [phone]) call the [facility] Crisis Line: (863)098-6207 call 911   Patient verbalizes understanding of instructions and care plan provided today and agrees to view in MyChart. Active  MyChart status and patient understanding of how to access instructions and care plan via MyChart confirmed with patient.     Caryl Pina, RN, BSN, Media planner  Transitions of Care  [facility] - [facility] [phone]: direct office

## 2023-03-21 NOTE — Patient Outreach (Signed)
Care Management  Transitions of Care Program Transitions of Care Post-discharge week 2; day # 7   03/21/2023 Name: Theresa Harrington MRN: [id number] DOB: 26-Sep-1955  Subjective: Theresa Harrington is a 67 y.o. year old female who is a primary care patient of Etta Grandchild, MD. The Care Management team Engaged with patient Engaged with patient by telephone to assess and address transitions of care needs.   Consent to Services:  Patient was given information about care management services, agreed to services, and gave verbal consent to participate.  Enrolled in [facility] 30-day program on 03/14/23  Assessment: "things are going fine.... I am just a little constipated... taking the usual miralax and stool softeners; have not been taking the narcotic pain medicine in quite awhile; otherwise I am fine; back to driving myself wherever I need to go; I am a bit bored-- I am ready to get back to work"          SDOH Interventions    Flowsheet Row Telephone from 03/14/2023 in [place] POPULATION HEALTH DEPARTMENT Clinical Support from 12/07/2021 in [facility] Clinical Support from 06/03/2020 in [facility] Chronic Care Management from 09/03/2019 in [facility] [place] Office Visit from 01/08/2019 in [facility]  SDOH Interventions       Food Insecurity Interventions Intervention Not Indicated Intervention Not Indicated Intervention Not Indicated -- --  Housing Interventions -- Intervention Not Indicated Intervention Not Indicated -- --  Transportation Interventions Intervention Not Indicated  [reports normally drives self,  friends assisting post-recent surgery] Intervention Not Indicated Intervention Not Indicated -- --  Depression Interventions/Treatment  -- -- -- -- PHQ2-9 Score <4 Follow-up Not Indicated  Financial Strain Interventions -- Intervention Not Indicated Intervention Not Indicated  Other (Comment)  [Pt will not qualify for LIS based on income,  pursue PAP for Basaglar, Humalog, Synjardy, Xarelto, Ozempic] --  Physical Activity Interventions -- Intervention Not Indicated Intervention Not Indicated, Patient Refused -- --  Stress Interventions -- Intervention Not Indicated Intervention Not Indicated -- --  Social Connections Interventions -- Intervention Not Indicated Intervention Not Indicated -- --  Health Literacy Interventions Intervention Not Indicated -- -- -- --        Goals Addressed             This Visit's Progress    TOC 30-day Program Care Plan   On track    Current Barriers:  Recent surgery for re-vascularization of (R) fem tib with (R) great toe wound; lives alone/ independent at baseline  RNCM Clinical Goal(s):  Patient will work with the Care Management team over the next 30 days to address Transition of Care Barriers: prevention of hospital readmission in independent patient who lives alone take all medications exactly as prescribed and will call provider for medication related questions as evidenced by patient reporting of same during [facility] 30-day program outreaches attend all scheduled medical appointments: 03/27/23- cardiology provider; 04/02/23- [facility] surgical provider as evidenced by review of same during [facility] 30-day program outreaches work with home health team ([facility]) to facilitate potential needs post-hospital discharge on 03/13/23 as evidenced by collaboration as indicated and patient reporting during TOC 30-day program outreaches not experience hospital admission as evidenced by review of EMR. Hospital Admissions in last 6 months = 1- planned/ elective surgery  through collaboration with RN Care manager, provider, and care team 03/21/23: Continue working with the pharmacist from the PCP office- currently established  in care to facilitate PAP for Basaglar and Synjardy: confirmed pharmacist is actively involved and patient has been approved for  PAP for both medications: awaiting shipment/ arrival of [facility] Candy Sledge [phone]  Interventions: Evaluation of current treatment plan related to  self management and patient's adherence to plan as established by provider  Transitions of Care:  Goal on track:  Yes. 03/21/23 Durable Medical Equipment (DME) needs assessed with patient/caregiver Doctor Visits  - discussed the importance of doctor visits Post discharge activity limitations prescribed by provider reviewed Post-op wound/incision care reviewed with patient/caregiver Confirmed patient is established with home health team with ongoing home visit-- patient reports she has scheduled home visit later today 03/21/23 Reinforced role of home health services with importance of participation/ ongoing engagement Confirmed obtained new DME-- confirms she is no longer requiring use of rolling walker- but has if needed--- reports independent in ambulation and "feels very steady" with ambulation Discussed current clinical condition: reports "things going fine.... I am just a little constipated... taking the usual miralax and stool softeners; have not been taking the narcotic pain medicine in quite awhile; otherwise I am fine; back to driving myself wherever I need to go; I am a bit bored-- I am ready to get back to work"-- otherwise denies specific clinical concerns today-- discussed non-pharmacological strategies she may want to try to resolve mild constipation; encouraged her to call surgeon's or PCP office if she needs additional advice around mild constipation; confirmed that her previously reported incisional oozing has completely resolved Re-reviewed medications: confirmed pharmacist from the PCP office is currently established in her care to facilitate PAP for Basaglar and Synjardy: confirmed pharmacist is actively involved and patient has been approved for PAP for both medications: awaiting shipment/ arrival of [facility]   Pharmacist Candy Sledge [phone]: patient has questions around typical process around shipping/ delivery of Synjardy from drug company: provided pharmacist name/ contact information and encouraged patient to call to have specific questions addressed by pharmacy team: she verbalizes agreement, states she will do  Patient Goals/Self-Care Activities: Participate in Transition of Care Program/Attend TOC scheduled calls Take all medications as prescribed Attend all scheduled provider appointments Call pharmacy for medication refills 3-7 days in advance of running out of medications Call provider office for new concerns or questions  Continue pacing activity as your recuperation from recent surgery continues Use assistive devices as/ if needed to prevent falls Continue working with the home health team that is involved in your care If you believe your condition is getting worse or you have concerns around your condition- contact your care providers (doctors) promptly- reaching out to your doctor early when you have concerns can prevent you from having to go to the hospital Continue working with Pharmacist team at your PCP office to have your questions around your Patient Assistance Approval answered  Follow Up Plan:  Telephone follow up appointment with care management team member scheduled for:  Wednesday, 03/28/23 at 9:45 am         Plan: Telephone follow up appointment with care management team member scheduled for: 03/28/23 at 9:45 am  Caryl Pina, RN, BSN, Media planner  Transitions of Care  [facility] - [facility] [phone]: direct office

## 2023-03-25 NOTE — Progress Notes (Unsigned)
Cardiology Office Note:  .   Date:  03/27/2023  ID:  [name], DOB [birth year], MRN [id number] PCP: Etta Grandchild, MD  [facility] Providers Cardiologist: Donato Schultz, MD  }   History of Present Illness: [name]   Theresa Harrington is a 67 y.o. 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  She is followed in the A-fib fib clinic and is status post a flutter ablation in the setting of COVID and personal stress on 08/02/2021.  She remains on Xarelto CHA2DS2-VASc score of 5   When last seen in the office on 02/26/2023 by me I increased her diltiazem 180 mg from 120 mg since she was having to take extra doses due to rapid heart rhythm.  She was to follow-up in A-fib clinic.  It was noted that she was hypertensive during that office visit with need to follow-up with EP with increased dose of diltiazem.   She is also status post right Fem PT bypass high-grade PTFE/vein with right CFA endarterectomy by Dr. Myra Gianotti on 03/09/2023 and discharged on 03/13/2023.  She was started on clopidogrel 75 mg daily and continued on her Xarelto on discharge.  Today feeling well, she has not had to take any extra doses of diltiazem and is tolerating the higher dose of diltiazem I put her on on last office visit at 180 mg.  No issues with bleeding or bruising.  She is up walking with a cane status post vascular surgery on the right.  ROS: As above otherwise negative.  Studies Reviewed: .   Echocardiogram 10/07/2020 1. Left ventricular ejection fraction, by estimation, is 65 to 70%. The  left ventricle has normal function. The left ventricle has no regional  wall motion abnormalities. There is mild left ventricular hypertrophy.  Left ventricular diastolic parameters  are consistent with Grade II diastolic dysfunction (pseudonormalization).  Elevated left atrial  pressure.   2. Right ventricular systolic function is normal. The right ventricular  size is normal. Tricuspid regurgitation signal is inadequate for assessing  PA pressure.   3. Left atrial size was severely dilated.   4. Right atrial size was mildly dilated.   5. The mitral valve is abnormal. Severe mitral annular calcification. No  evidence of mitral valve regurgitation. Mild to moderate mitral stenosis.  MG , MVA 1.4 cm^2 by continuity equation   6. The aortic valve is tricuspid. Aortic valve regurgitation is not  visualized. Mild to moderate aortic valve sclerosis/calcification is  present, without any evidence of aortic stenosis.   7. The inferior vena cava is normal in size with greater than 50%  respiratory variability, suggesting right atrial pressure of 3 mmHg.    Physical Exam:   VS:  BP (!) 144/60 (BP Location: Left Arm, Patient Position: Sitting, Cuff Size: Normal)   Pulse 62   Ht 5\' 5"  (1.651 m)   Wt 174 lb (78.9 kg)   LMP  (LMP Unknown)   SpO2 99%   BMI 28.96 kg/m    Wt Readings from Last 3 Encounters:  03/27/23 174 lb (78.9 kg)  03/09/23 170 lb (77.1 kg)  03/06/23 169 lb (76.7 kg)    GEN: Well nourished, well developed in no acute distress NECK: No JVD; No carotid bruits CARDIAC: RRR, soft 2/6 systolic murmur heard at the left sternal border.  No rubs, gallops RESPIRATORY:  Clear to auscultation without rales, wheezing or  rhonchi  ABDOMEN: Soft, non-tender, non-distended EXTREMITIES:  No edema; No deformity well-healed incisions without evidence of infection, significant edema, or evisceration.  Call  ASS ESSMENT AND PLAN: .    Paroxysmal atrial fibrillation: Has not had to have any extra doses of diltiazem as she was a month ago since increasing her dose of diltiazem 180 mg daily.  Blood pressure is well-controlled.  Continue current medication regimen.  She will also follow-up in A-fib clinic.  Hypertension: Blood pressure is better controlled today  than on previous office visit.  She is tolerating the higher dose of diltiazem without hypotension.  Continue current medication regimen.  No changes at this time.  3.  Hypercholesterolemia: On Zetia.  Intolerant of statin therapy.  Labs are followed by PCP and may need to consider candidacy for PCSK9 inhibition if LDL remains elevated greater than 70.  4.  Peripheral arterial disease: She is status post stem PTCFA endarterectomy by Dr. Myra Gianotti on 03/09/2023.  Continue to follow with [facility].          Signed, Bettey Mare. Liborio Nixon, ANP, AACC

## 2023-03-26 ENCOUNTER — Encounter (HOSPITAL_COMMUNITY): Payer: PPO

## 2023-03-26 ENCOUNTER — Ambulatory Visit: Payer: PPO | Admitting: Surgery

## 2023-03-27 ENCOUNTER — Ambulatory Visit: Payer: PPO | Attending: Adult Health | Admitting: Adult Health

## 2023-03-27 ENCOUNTER — Encounter: Payer: Self-pay | Admitting: Adult Health

## 2023-03-27 VITALS — BP 144/60 | HR 62 | Ht 65.0 in | Wt 174.0 lb

## 2023-03-27 DIAGNOSIS — I4819 Other persistent atrial fibrillation: Secondary | ICD-10-CM | POA: Diagnosis not present

## 2023-03-27 DIAGNOSIS — D6869 Other thrombophilia: Secondary | ICD-10-CM

## 2023-03-27 DIAGNOSIS — I739 Peripheral vascular disease, unspecified: Secondary | ICD-10-CM | POA: Diagnosis not present

## 2023-03-27 DIAGNOSIS — I10 Essential (primary) hypertension: Secondary | ICD-10-CM | POA: Diagnosis not present

## 2023-03-27 DIAGNOSIS — I480 Paroxysmal atrial fibrillation: Secondary | ICD-10-CM

## 2023-03-27 NOTE — Patient Instructions (Signed)
Medication Instructions:  No Changes *If you need a refill on your cardiac medications before your next appointment, please call your pharmacy*   Lab Work: No labs If you have labs (blood work) drawn today and your tests are completely normal, you will receive your results only by: MyChart Message (if you have MyChart) OR A paper copy in the mail If you have any lab test that is abnormal or we need to change your treatment, we will call you to review the results.   Testing/Procedures: No Testing   Follow-Up: At [facility], you and your health needs are our priority.  As part of our continuing mission to provide you with exceptional heart care, we have created designated Provider Care Teams.  These Care Teams include your primary Cardiologist (physician) and Advanced Practice Providers (APPs -  Physician Assistants and Nurse Practitioners) who all work together to provide you with the care you need, when you need it.  We recommend signing up for the patient portal called "MyChart".  Sign up information is provided on this After Visit Summary.  MyChart is used to connect with patients for Virtual Visits (Telemedicine).  Patients are able to view lab/test results, encounter notes, upcoming appointments, etc.  Non-urgent messages can be sent to your provider as well.   To learn more about what you can do with MyChart, go to [email or web address].    Your next appointment:   6 month(s)  Provider:   Donato Schultz, MD

## 2023-03-28 ENCOUNTER — Other Ambulatory Visit: Payer: Self-pay | Admitting: *Deleted

## 2023-03-28 NOTE — Patient Outreach (Signed)
Care Management  Transitions of Care Program Transitions of Care Post-discharge week 3/ day # 14   03/28/2023 Name: Theresa Harrington MRN: [id number] DOB: [birth year]  Subjective: Theresa Harrington is a 67 y.o. year old female who is a primary care patient of Etta Grandchild, MD. The Care Management team Engaged with patient Engaged with patient by telephone to assess and address transitions of care needs.   Consent to Services:  Patient was given information about care management services, agreed to services, and gave verbal consent to participate.  Enrolled 03/14/23  Assessment: "Things going fine.... I am no longer constipated... still driving myself wherever I need to go, and hoping they release me to go back to work after my post-op appointment with the surgeon on Monday 12/2; I got a very good report from the cardiologist yesterday- no changes. Everything looks fine with the incision"          SDOH Interventions    Flowsheet Row Patient Outreach from 03/28/2023 in [place] [facility] DEPARTMENT Telephone from 03/14/2023 in [place] POPULATION HEALTH DEPARTMENT Clinical Support from 12/07/2021 in [facility] Clinical Support from 06/03/2020 in [facility] at [facility] Chronic Care Management from 09/03/2019 in [facility] at [facility] Office Visit from 01/08/2019 in [facility]  SDOH Interventions        Food Insecurity Interventions -- Intervention Not Indicated Intervention Not Indicated Intervention Not Indicated -- --  Housing Interventions Intervention Not Indicated -- Intervention Not Indicated Intervention Not Indicated -- --  Transportation Interventions -- Intervention Not Indicated  [reports normally drives self,  friends assisting post-recent surgery] Intervention Not Indicated Intervention Not Indicated -- --  Utilities Interventions Intervention Not Indicated -- -- -- -- --   Depression Interventions/Treatment  -- -- -- -- -- PHQ2-9 Score <4 Follow-up Not Indicated  Financial Strain Interventions -- -- Intervention Not Indicated Intervention Not Indicated Other (Comment)  [Pt will not qualify for LIS based on income,  pursue PAP for Basaglar, Humalog, Synjardy, Xarelto, Ozempic] --  Physical Activity Interventions -- -- Intervention Not Indicated Intervention Not Indicated, Patient Refused -- --  Stress Interventions -- -- Intervention Not Indicated Intervention Not Indicated -- --  Social Connections Interventions -- -- Intervention Not Indicated Intervention Not Indicated -- --  Health Literacy Interventions -- Intervention Not Indicated -- -- -- --        Goals Addressed             This Visit's Progress    TOC 30-day Program Care Plan   On track    Current Barriers:  Recent surgery for re-vascularization of (R) fem tib with (R) great toe wound; lives alone/ independent at baseline  RNCM Clinical Goal(s):  Patient will work with the Care Management team over the next 30 days to address Transition of Care Barriers: prevention of hospital readmission in independent patient who lives alone take all medications exactly as prescribed and will call provider for medication related questions as evidenced by patient reporting of same during [facility] 30-day program outreaches attend all scheduled medical appointments: 04/02/23- [facility] surgical provider as evidenced by review of same during [facility] 30-day program outreaches work with home health team ([facility]) to facilitate potential needs post-hospital discharge on 03/13/23 as evidenced by collaboration as indicated and patient reporting during [facility] 30-day program outreaches not experience hospital admission as evidenced by review of EMR. Hospital Admissions in last 6 months = 1- planned/  elective surgery  through collaboration with RN Care manager, provider, and care team 03/21/23: Continue working with the pharmacist from  the PCP office- currently established in care to facilitate PAP for Basaglar and Synjardy: confirmed pharmacist is actively involved and patient has been approved for PAP for both medications: awaiting shipment/ arrival of [facility] Candy Sledge [phone] 03/28/23: reports still awaiting delivery of Synjardy- reports she has been approved for PAP and has received letter of confirmation with contact information for drug supplier Pharmacist Candy Sledge [phone]  Interventions: Evaluation of current treatment plan related to  self management and patient's adherence to plan as established by provider  Transitions of Care:  Goal on track:  Yes. 03/28/23 Doctor Visits  - discussed the importance of doctor visits Confirmed patient is established with home health team with ongoing home visit-- patient reports she has scheduled home visit later today 03/28/23- reports "they are coming today, but I think today may be their last visit; they said I was doing so good I don't need them anymore" Reinforced role of home health services with importance of participation/ ongoing engagement Confirmed no longer requiring use of rolling walker- but has if needed--- reports independent in ambulation and "feels very steady" with ambulation Discussed current clinical condition: reports "things going fine.... I am no longer constipated... still driving myself wherever I need to go, and hoping they release me to go back to work after my post-op appointment with the surgeon on Monday 12/2; I got a very good report from the cardiologist yesterday- no changes."-- denies specific clinical concerns today-- confirmed again that her previously reported incisional oozing has still completely resolved, states "everything looks fine with the incision" Confirmed patient continues monitoring blood sugars at home BID Reviewed recent blood sugars at home: patient reports today, morning ranges between 90-120 with  this morning value of "99"; pm ranges between 130-170 with last night reported value of "160" Reviewed most recent A1C value from 02/20/23: 8.4; reviewed historical trends over time with patient who verbalizes good understanding of same Reinforced blood sugar management at home; need to follow carbohydrate modified diet; use of short- and long- acting insulin Reinforced signs/ symptoms low blood sugar with corresponding action plan: today, she denies signs/ symptoms/ home values that indicate hypoglycemia and verbalizes good baseline understanding of same  Patient Goals/Self-Care Activities: Participate in Transition of Care Program/Attend TOC scheduled calls Take all medications as prescribed Attend all scheduled provider appointments Call pharmacy for medication refills 3-7 days in advance of running out of medications Call provider office for new concerns or questions  Continue pacing activity as your recuperation from recent surgery continues Use assistive devices as/ if needed to prevent falls Continue working with the home health team that is involved in your care If you believe your condition is getting worse or you have concerns around your condition- contact your care providers (doctors) promptly- reaching out to your doctor early when you have concerns can prevent you from having to go to the hospital Continue working with Pharmacist team at your PCP office to have your questions around your Patient Assistance Approval answered Keep up the great work monitoring and writing down your blood sugars at home twice a day Continue following a carbohydrate modified, low sugar, heart healthy/ low salt diet  Follow Up Plan:  Telephone follow up appointment with care management team member scheduled for:  Thursday, 04/05/23 at 9:45 am          Plan: Telephone follow up  appointment with care management team member scheduled for:  Thursday, 04/05/23 at 9:45 am  Caryl Pina, RN, BSN,  CCRN Alumnus RN Care Manager  Transitions of Care  [facility] [phone]: direct office

## 2023-03-28 NOTE — Patient Instructions (Signed)
Visit Information  Thank you for taking time to visit with me today. Please don't hesitate to contact me if I can be of assistance to you before our next scheduled telephone appointment.  Our next appointment is by telephone on Thursday 04/05/23 at 9:45 am  Please call the care guide team at [phone] if you need to cancel or reschedule your appointment.   Following are the goals we discussed today:  Patient Goals/Self-Care Activities: Participate in Transition of Care Program/Attend TOC scheduled calls Take all medications as prescribed Attend all scheduled provider appointments Call pharmacy for medication refills 3-7 days in advance of running out of medications Call provider office for new concerns or questions  Continue pacing activity as your recuperation from recent surgery continues Use assistive devices as/ if needed to prevent falls Continue working with the home health team that is involved in your care If you believe your condition is getting worse or you have concerns around your condition- contact your care providers (doctors) promptly- reaching out to your doctor early when you have concerns can prevent you from having to go to the hospital Continue working with Pharmacist team at your PCP office to have your questions around your Patient Assistance Approval answered Keep up the great work monitoring and writing down your blood sugars at home twice a day Continue following a carbohydrate modified, low sugar, heart healthy/ low salt diet   If you are experiencing a Mental Health or Behavioral Health Crisis or need someone to talk to, please  call the Suicide and Crisis Lifeline: 988 call the [place] National Suicide Prevention Lifeline: 417-015-9711 or TTY: (941)704-8311 TTY 640 428 0944) to talk to a trained counselor call 1-800-273-TALK (toll free, 24 hour hotline) go to [facility] [address], [place] [phone]) call the  [facility] Crisis Line: [phone] call 911   Patient verbalizes understanding of instructions and care plan provided today and agrees to view in MyChart. Active MyChart status and patient understanding of how to access instructions and care plan via MyChart confirmed with patient.     Caryl Pina, RN, BSN, Media planner  Transitions of Care  [facility] - [facility] [phone]: direct office

## 2023-04-02 ENCOUNTER — Encounter: Payer: Self-pay | Admitting: Surgery

## 2023-04-02 ENCOUNTER — Ambulatory Visit (INDEPENDENT_AMBULATORY_CARE_PROVIDER_SITE_OTHER): Payer: PPO | Admitting: Physician Assistant

## 2023-04-02 VITALS — BP 112/64 | HR 60 | Temp 97.8°F | Ht 65.0 in | Wt 171.8 lb

## 2023-04-02 DIAGNOSIS — I7025 Atherosclerosis of native arteries of other extremities with ulceration: Secondary | ICD-10-CM

## 2023-04-02 MED ORDER — CLOPIDOGREL BISULFATE 75 MG PO TABS: 75.0000 mg | ORAL_TABLET | Freq: Every day | ORAL | 0 refills | Status: DC

## 2023-04-02 MED ORDER — PANTOPRAZOLE SODIUM 40 MG PO TBEC: 40.0000 mg | DELAYED_RELEASE_TABLET | Freq: Every day | ORAL | 0 refills | Status: DC

## 2023-04-02 NOTE — Progress Notes (Unsigned)
POST OPERATIVE OFFICE NOTE    CC:  F/u for surgery  HPI:  This is a 67 y.o. female who is s/p right femoral to posterior tibial artery bypass graft with composite bypass as well as iliofemoral endarterectomy with vein patch angioplasty by Dr. Myra Gianotti on 03/09/2023 due to right great toe ulceration.  She believes the right toe ulcer is healing well.  She also believes her incisions are healing well.  She denies rest pain or any further tissue loss.  She would like to return to work.  Her job does not require manual labor.  Her main concern is of swelling in her right lower leg and ankle.  She is on aspirin, Plavix, Xarelto daily.  She is also on a daily statin.    Allergies  Allergen Reactions   Amlodipine Swelling   Atorvastatin Other (See Comments)    Muscle aches    Ampicillin Nausea And Vomiting and Other (See Comments)   Codeine Nausea And Vomiting    Ask patient   Lisinopril Cough        Penicillins Nausea And Vomiting    Did it involve swelling of the face/tongue/throat, SOB, or low BP? No Did it involve sudden or severe rash/hives, skin peeling, or any reaction on the inside of your mouth or nose? No Did you need to seek medical attention at a hospital or doctor's office? No When did it last happen?      20+ years If all above answers are "NO", may proceed with cephalosporin use.    Januvia [Sitagliptin] Nausea And Vomiting    Current Outpatient Medications  Medication Sig Dispense Refill   aspirin EC 81 MG tablet Take 1 tablet (81 mg total) by mouth daily. Swallow whole. 30 tablet 12   Blood Glucose Monitoring Suppl (ONETOUCH VERIO IQ SYSTEM) w/Device KIT USE TO CHECK SUGAR DAILY 1 kit 0   cetirizine (ZYRTEC) 10 MG tablet Take 10 mg by mouth daily as needed for allergies.     diltiazem (CARDIZEM CD) 180 MG 24 hr capsule Take 1 capsule (180 mg total) by mouth daily. 90 capsule 3   diltiazem (CARDIZEM) 30 MG tablet Take 1 tablet every 4 hours AS NEEDED for AFIB heart rate  >100 as long as top BP >100. 30 tablet 1   dorzolamide-timolol (COSOPT) 22.3-6.8 MG/ML ophthalmic solution Place 1 drop into the right eye 2 (two) times daily.      ezetimibe (ZETIA) 10 MG tablet Take 1 tablet (10 mg total) by mouth daily. 90 tablet 0   HUMALOG KWIKPEN 100 UNIT/ML KwikPen DIAL 14 UNITS AND INJECT UNDER THE SKIN THREE TIMES A DAY. 45 mL 0   hydrALAZINE (APRESOLINE) 50 MG tablet Take 1 tablet (50 mg total) by mouth 3 (three) times daily. (Patient taking differently: Take 50 mg by mouth 2 (two) times daily.) 270 tablet 0   Insulin Glargine (BASAGLAR KWIKPEN) 100 UNIT/ML Inject 60 Units into the skin daily. (Patient taking differently: Inject 50 Units into the skin at bedtime.) 54 mL 1   Insulin Pen Needle (PEN NEEDLES) 32G X 4 MM MISC Inject 1 pen  as directed 4 (four) times daily. Use to inject levemir or novolog as directed. 400 each 3   latanoprost (XALATAN) 0.005 % ophthalmic solution Place 1 drop into both eyes at bedtime.      MAGnesium-Oxide 400 (240 Mg) MG tablet Take 1 tablet (400 mg total) by mouth daily. 90 tablet 0   nebivolol (BYSTOLIC) 10 MG tablet Take 1  tablet by mouth once daily 90 tablet 3   ondansetron (ZOFRAN) 4 MG tablet Take 1 tablet (4 mg total) by mouth every 8 (eight) hours as needed for nausea or vomiting. 20 tablet 0   ondansetron (ZOFRAN) 4 MG tablet Take 1 tablet (4 mg total) by mouth daily as needed for nausea or vomiting. 30 tablet 1   ONETOUCH VERIO test strip USE TO check blood glucose TWICE DAILY 200 strip 3   oxyCODONE (OXY IR/ROXICODONE) 5 MG immediate release tablet Take 1-2 tablets (5-10 mg total) by mouth every 4 (four) hours as needed for moderate pain (pain score 4-6). 30 tablet 0   rosuvastatin (CRESTOR) 5 MG tablet Take 1 tablet (5 mg total) by mouth daily. 90 tablet 3   Semaglutide, 1 MG/DOSE, (OZEMPIC, 1 MG/DOSE,) 4 MG/3ML SOPN Inject 1 mg into the skin once a week. Via [employer] patient assistance 9 mL 1   SYNJARDY 5-500 MG TABS Take 1  tablet by mouth twice daily 180 tablet 0   XARELTO 20 MG TABS tablet Take 1 tablet by mouth once daily 90 tablet 0   clopidogrel (PLAVIX) 75 MG tablet Take 1 tablet (75 mg total) by mouth daily at 6 (six) AM. 30 tablet 0   doxycycline (ADOXA) 100 MG tablet Take 50 mg by mouth 2 (two) times daily. (Patient not taking: Reported on 03/27/2023)     pantoprazole (PROTONIX) 40 MG tablet Take 1 tablet (40 mg total) by mouth daily. 30 tablet 0   No current facility-administered medications for this visit.     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 female who is s/p: Right iliofemoral endarterectomy with vein patch angioplasty and femoral to posterior tibial artery composite bypass  Right foot well-perfused with multiphasic PT and monophasic DP by Doppler Right great toe tip demarcating well; no indication for partial amputation; continue to paint with Betadine Patient was measured for and prescribed light compression to wear during the day especially if returning to work.  Okay to return to work on Monday, 04/09/2023 since her job does not require manual labor. Plavix and Protonix prescriptions were refilled at the request of the patient Return in 1 month for bypass duplex and ABI   Emilie Rutter, PA-C [facility] [phone]  Clinic MD:  Karin Lieu on call

## 2023-04-03 ENCOUNTER — Encounter: Payer: Self-pay | Admitting: Physician Assistant

## 2023-04-05 ENCOUNTER — Other Ambulatory Visit: Payer: Self-pay | Admitting: *Deleted

## 2023-04-05 ENCOUNTER — Telehealth (HOSPITAL_COMMUNITY): Payer: Self-pay | Admitting: *Deleted

## 2023-04-05 ENCOUNTER — Telehealth: Payer: Self-pay

## 2023-04-05 ENCOUNTER — Other Ambulatory Visit: Payer: Self-pay

## 2023-04-05 DIAGNOSIS — I7025 Atherosclerosis of native arteries of other extremities with ulceration: Secondary | ICD-10-CM

## 2023-04-05 NOTE — Telephone Encounter (Signed)
Patient called in stating last several days she has noticed increase fatigue/weakness. She did notice she was in afib today which she has taken PRN cardizem for (her normal regimen with afib) but she knows she was not in afib several days ago when the symptoms started.  Pt does admit to having black tarry stool the last few days (denies iron usage). Pt currently on triple therapy post vascular surgery. Instructed pt to contact [facility] regarding symptoms. Pt in agreement. If afib persistent she will call back regarding this next week.

## 2023-04-05 NOTE — Patient Outreach (Signed)
Care Management  Transitions of Care Program Transitions of Care Post-discharge week 4/ day # 22   04/05/2023 Name: Theresa Harrington MRN: [id number] DOB: [birth year]  Subjective: Theresa Harrington is a 67 y.o. year old female who is a primary care patient of Etta Grandchild, MD. The Care Management team Engaged with patient Engaged with patient by telephone to assess and address transitions of care needs.   Consent to Services:  Patient was given information about care management services, agreed to services, and gave verbal consent to participate.  Enrolled 03/14/23  Assessment: "I am doing great, just have low energy; the surgeon told me on 04/02/23 that I can return to work on 12/9.... I am still trying to decide if I am going to go back or wait awhile... still driving myself wherever I need to go.  There were no changes made to my medicines or overall plan when I saw the surgeon on 04/02/23, so I think I am doing just like they want me to and healing well;"          SDOH Interventions    Flowsheet Row Patient Outreach from 03/28/2023 in [place] POPULATION HEALTH DEPARTMENT Telephone from 03/14/2023 in [place] POPULATION HEALTH DEPARTMENT Clinical Support from 12/07/2021 in [facility] Clinical Support from 06/03/2020 in [facility] Chronic Care Management from 09/03/2019 in [facility] [place] Office Visit from 01/08/2019 in [facility]  SDOH Interventions        Food Insecurity Interventions -- Intervention Not Indicated Intervention Not Indicated Intervention Not Indicated -- --  Housing Interventions Intervention Not Indicated -- Intervention Not Indicated Intervention Not Indicated -- --  Transportation Interventions -- Intervention Not Indicated  [reports normally drives self,  friends assisting post-recent surgery] Intervention Not Indicated Intervention Not Indicated -- --   Utilities Interventions Intervention Not Indicated -- -- -- -- --  Depression Interventions/Treatment  -- -- -- -- -- PHQ2-9 Score <4 Follow-up Not Indicated  Financial Strain Interventions -- -- Intervention Not Indicated Intervention Not Indicated Other (Comment)  [Pt will not qualify for LIS based on income,  pursue PAP for Basaglar, Humalog, Synjardy, Xarelto, Ozempic] --  Physical Activity Interventions -- -- Intervention Not Indicated Intervention Not Indicated, Patient Refused -- --  Stress Interventions -- -- Intervention Not Indicated Intervention Not Indicated -- --  Social Connections Interventions -- -- Intervention Not Indicated Intervention Not Indicated -- --  Health Literacy Interventions -- Intervention Not Indicated -- -- -- --        Goals Addressed             This Visit's Progress    TOC 30-day Program Care Plan   On track    Current Barriers:  Recent surgery for re-vascularization of (R) fem tib with (R) great toe wound; lives alone/ independent at baseline  RNCM Clinical Goal(s):  Patient will work with the Care Management team over the next 30 days to address Transition of Care Barriers: prevention of hospital readmission in independent patient who lives alone take all medications exactly as prescribed and will call provider for medication related questions as evidenced by patient reporting of same during [facility] 30-day program outreaches attend all scheduled medical appointments: 04/02/23- [facility] surgical provider as evidenced by review of same during [facility] 30-day program outreaches work with home health team ([facility]) to facilitate potential needs post-hospital discharge on 03/13/23 as evidenced by collaboration as indicated and  patient reporting during TOC 30-day program outreaches not experience hospital admission as evidenced by review of EMR. Hospital Admissions in last 6 months = 1- planned/ elective surgery  through collaboration with RN Care manager, provider,  and care team 03/21/23: Continue working with the pharmacist from the PCP office- currently established in care to facilitate PAP for Basaglar and Synjardy: confirmed pharmacist is actively involved and patient has been approved for PAP for both medications: awaiting shipment/ arrival of [facility] Candy Sledge [phone] 03/28/23: reports still awaiting delivery of Synjardy- reports she has been approved for PAP and has received letter of confirmation with contact information for drug supplier Pharmacist Candy Sledge [phone] 04/05/23: confirmed patient received and is taking Synjardy from PAP drug manufacturer  Interventions: Evaluation of current treatment plan related to  self management and patient's adherence to plan as established by provider  Transitions of Care:  Goal on track:  Yes. 04/05/23 Doctor Visits  - discussed the importance of doctor visits Post-op wound/incision care reviewed with patient/caregiver Confirmed continues no longer requiring use of rolling walker- but has if needed--- reports again she is independent in ambulation and "feels very steady" with ambulation Discussed current clinical condition: reports "I am doing great, just have low energy; the surgeon told me on 04/02/23 that I can return to work on 12/9.... I am still trying to decide if I am going to go back or wait awhile... still driving myself wherever I need to go.  There were no changes made to my medicines or overall plan when I saw the surgeon on 04/02/23, so I think I am doing just like they want me to and healing well;" denies specific clinical concerns today Confirmed patient continues monitoring blood sugars at home BID Reviewed recent blood sugars at home: patient reports again today, morning ranges between 90-120 with this morning value of "98"; pm ranges between 130-170  Reinforced blood sugar management at home; need to follow carbohydrate modified diet; use of short- and long-  acting insulin Confirmed home health services have signed off and are no longer active  Patient Goals/Self-Care Activities: Participate in Transition of Care Program/Attend TOC scheduled calls Take all medications as prescribed Attend all scheduled provider appointments Call pharmacy for medication refills 3-7 days in advance of running out of medications Call provider office for new concerns or questions  Continue pacing activity as your recuperation from recent surgery continues Use assistive devices as/ if needed to prevent falls If you believe your condition is getting worse or you have concerns around your condition- contact your care providers (doctors) promptly- reaching out to your doctor early when you have concerns can prevent you from having to go to the hospital Keep up the great work monitoring and writing down your blood sugars at home twice a day Continue following a carbohydrate modified, low sugar, heart healthy/ low salt diet  Follow Up Plan:  Telephone follow up appointment with care management team member scheduled for:  Friday, 04/13/23 at 9:45 am          Plan: Telephone follow up appointment with care management team member scheduled for:  Friday, 04/13/23 at 9:45 am  Caryl Pina, RN, BSN, CCRN Alumnus RN Care Manager  Transitions of Care  [facility] [phone]: direct office

## 2023-04-05 NOTE — Telephone Encounter (Addendum)
Pt called with c/o low energy the last few days and had then called [facility] clinic after she felt she went into A fib. They asked her about her stool and she told them it was black the last two days. I have advised her to call her PCP to make an appt. I Called pt to f/u and she has an appt tomorrow morning with PCP. Per APP Marisue Humble), pt can stop Plavix if she experiences frank blood. Pt has been advised to let us know if this occurs. All questions/concerns answered.

## 2023-04-05 NOTE — Progress Notes (Signed)
Subjective:    Patient ID: Theresa Harrington, female    DOB: 09/19/1955, 67 y.o.   MRN: [id number]      HPI Theresa Harrington is here for  Chief Complaint  Patient presents with   Fatigue    2 weeks had surgery; Came home with energy and is in afib today. Patient reports that she is also having black stools; Patient having a hard time going to the bathroom with constipation    Had suregery a couple of weeks ago 11/8.  Had right fem-pop-tib bypass.  Energy prior to surgery was good- initialy just after surgery it was also was good.   Xarelto, asa 81 mg and plavix   ( both asa and plaxix new since surgery.   Lethargy -  had increased fatigue x couple of weeks.    Constipation since surgery.  BM were normal prior to surgery.  Took magnesium citrate since surgery - that helps.  Was taking colace, Dulcolax, but not consistently..    Black stools x 2-3 days.  No nausea, abdominal pain, GERD   Eating and drinking normal.   Medications and allergies reviewed with patient and updated if appropriate.  Current Outpatient Medications on File Prior to Visit  Medication Sig Dispense Refill   aspirin EC 81 MG tablet Take 1 tablet (81 mg total) by mouth daily. Swallow whole. 30 tablet 12   Blood Glucose Monitoring Suppl (ONETOUCH VERIO IQ SYSTEM) w/Device KIT USE TO CHECK SUGAR DAILY 1 kit 0   cetirizine (ZYRTEC) 10 MG tablet Take 10 mg by mouth daily as needed for allergies.     clopidogrel (PLAVIX) 75 MG tablet Take 1 tablet (75 mg total) by mouth daily at 6 (six) AM. 30 tablet 0   diltiazem (CARDIZEM CD) 180 MG 24 hr capsule Take 1 capsule (180 mg total) by mouth daily. 90 capsule 3   diltiazem (CARDIZEM) 30 MG tablet Take 1 tablet every 4 hours AS NEEDED for AFIB heart rate >100 as long as top BP >100. 30 tablet 1   dorzolamide-timolol (COSOPT) 22.3-6.8 MG/ML ophthalmic solution Place 1 drop into the right eye 2 (two) times daily.      doxycycline (ADOXA) 100 MG tablet Take 50 mg by mouth 2 (two) times  daily.     ezetimibe (ZETIA) 10 MG tablet Take 1 tablet (10 mg total) by mouth daily. 90 tablet 0   HUMALOG KWIKPEN 100 UNIT/ML KwikPen DIAL 14 UNITS AND INJECT UNDER THE SKIN THREE TIMES A DAY. 45 mL 0   hydrALAZINE (APRESOLINE) 50 MG tablet Take 1 tablet (50 mg total) by mouth 3 (three) times daily. (Patient taking differently: Take 50 mg by mouth 2 (two) times daily.) 270 tablet 0   Insulin Glargine (BASAGLAR KWIKPEN) 100 UNIT/ML Inject 60 Units into the skin daily. (Patient taking differently: Inject 50 Units into the skin at bedtime.) 54 mL 1   Insulin Pen Needle (PEN NEEDLES) 32G X 4 MM MISC Inject 1 pen  as directed 4 (four) times daily. Use to inject levemir or novolog as directed. 400 each 3   latanoprost (XALATAN) 0.005 % ophthalmic solution Place 1 drop into both eyes at bedtime.      MAGnesium-Oxide 400 (240 Mg) MG tablet Take 1 tablet (400 mg total) by mouth daily. 90 tablet 0   nebivolol (BYSTOLIC) 10 MG tablet Take 1 tablet by mouth once daily 90 tablet 3   ondansetron (ZOFRAN) 4 MG tablet Take 1 tablet (4 mg total) by mouth every  8 (eight) hours as needed for nausea or vomiting. 20 tablet 0   ondansetron (ZOFRAN) 4 MG tablet Take 1 tablet (4 mg total) by mouth daily as needed for nausea or vomiting. 30 tablet 1   ONETOUCH VERIO test strip USE TO check blood glucose TWICE DAILY 200 strip 3   oxyCODONE (OXY IR/ROXICODONE) 5 MG immediate release tablet Take 1-2 tablets (5-10 mg total) by mouth every 4 (four) hours as needed for moderate pain (pain score 4-6). 30 tablet 0   pantoprazole (PROTONIX) 40 MG tablet Take 1 tablet (40 mg total) by mouth daily. 30 tablet 0   rosuvastatin (CRESTOR) 5 MG tablet Take 1 tablet (5 mg total) by mouth daily. 90 tablet 3   Semaglutide, 1 MG/DOSE, (OZEMPIC, 1 MG/DOSE,) 4 MG/3ML SOPN Inject 1 mg into the skin once a week. Via [employer] patient assistance 9 mL 1   SYNJARDY 5-500 MG TABS Take 1 tablet by mouth twice daily 180 tablet 0   XARELTO 20 MG TABS  tablet Take 1 tablet by mouth once daily 90 tablet 0   No current facility-administered medications on file prior to visit.    Review of Systems  Constitutional:  Positive for fatigue. Negative for fever.  Respiratory:  Positive for shortness of breath (little). Negative for cough and wheezing.   Cardiovascular:  Negative for chest pain and palpitations.  Gastrointestinal:  Positive for constipation. Negative for abdominal pain, blood in stool (positive black stools) and nausea.       No gerd  Neurological:  Negative for light-headedness and headaches.       Objective:   Vitals:   04/06/23 0956  BP: 116/72  Pulse: (!) 104  Temp: 98 F (36.7 C)  SpO2: 100%   BP Readings from Last 3 Encounters:  04/06/23 116/72  04/02/23 112/64  03/27/23 (!) 144/60   Wt Readings from Last 3 Encounters:  04/06/23 165 lb (74.8 kg)  04/06/23 167 lb (75.8 kg)  04/02/23 171 lb 12.8 oz (77.9 kg)   Body mass index is 28.32 kg/m.    Physical Exam Constitutional:      General: She is not in acute distress.    Comments: Slight pallor appearance  Cardiovascular:     Rate and Rhythm: Normal rate. Rhythm irregular.  Pulmonary:     Effort: Pulmonary effort is normal. No respiratory distress.     Breath sounds: Normal breath sounds. No wheezing or rales.  Abdominal:     General: There is no distension.     Palpations: Abdomen is soft.     Tenderness: There is no abdominal tenderness. There is no guarding or rebound.  Musculoskeletal:     Right lower leg: No edema.     Left lower leg: No edema.  Skin:    General: Skin is warm and dry.  Neurological:     Mental Status: She is alert.            Assessment & Plan:    Fatigue, iron deficiency anemia, black stools: Fatigue is acute for 2-3 weeks Black stools x 2-3 days History of iron deficiency anemia Fatigue is significant She is now on 3 blood thinners no concern she has an upper GI bleed resulting in worsening of her iron  deficiency anemia and fatigue CBC, iron panel, TSH, CMP Depending on results may need to contact us here about revising medications or go to ED for acute GI bleed, acute worsening of anemia secondary to blood loss  Constipation: New  Discussed bowel regimen that needs to be consistent to help-avoid frequent use of laxatives-start MiraLAX daily, stool softener daily  Hypertension: Chronic Blood pressure well-controlled here today No change in medications Continue diltiazem 180 mg daily, hydralazine 50 mg 3 times daily, Bystolic 10 mg daily  I spent 30 minutes dedicated to the care of this patient on the date of this encounter including review of recent labs from recent hospitalization, imaging and procedures and speciality notes from vascular surgery, obtaining history, communicating with the patient, ordering tests, and documenting clinical information in the EHR

## 2023-04-05 NOTE — Patient Instructions (Signed)
Visit Information  Thank you for taking time to visit with me today. Please don't hesitate to contact me if I can be of assistance to you before our next scheduled telephone appointment.  Our next appointment is by telephone on Friday, 04/13/23 at 09:45 am  Please call the care guide team at [phone] if you need to cancel or reschedule your appointment.   Following are the goals we discussed today:  Patient Goals/Self-Care Activities: Participate in Transition of Care Program/Attend TOC scheduled calls Take all medications as prescribed Attend all scheduled provider appointments Call pharmacy for medication refills 3-7 days in advance of running out of medications Call provider office for new concerns or questions  Continue pacing activity as your recuperation from recent surgery continues Use assistive devices as/ if needed to prevent falls If you believe your condition is getting worse or you have concerns around your condition- contact your care providers (doctors) promptly- reaching out to your doctor early when you have concerns can prevent you from having to go to the hospital Keep up the great work monitoring and writing down your blood sugars at home twice a day Continue following a carbohydrate modified, low sugar, heart healthy/ low salt diet  If you are experiencing a Mental Health or Behavioral Health Crisis or need someone to talk to, please  call the Suicide and Crisis Lifeline: 988 call the [place] National Suicide Prevention Lifeline: 332 138 7657 or TTY: 607-499-6344 TTY (920)619-7188) to talk to a trained counselor call 1-800-273-TALK (toll free, 24 hour hotline) go to [facility] [address] [place] [phone]) call the [facility] Crisis Line: 8163921507 call 911   Patient verbalizes understanding of instructions and care plan provided today and agrees to view in MyChart. Active MyChart status and patient  understanding of how to access instructions and care plan via MyChart confirmed with patient.     Caryl Pina, RN, BSN, Media planner  Transitions of Care  [facility] - [facility] [phone]: direct office

## 2023-04-06 ENCOUNTER — Other Ambulatory Visit: Payer: Self-pay

## 2023-04-06 ENCOUNTER — Telehealth: Payer: Self-pay

## 2023-04-06 ENCOUNTER — Inpatient Hospital Stay (HOSPITAL_COMMUNITY)
Admission: EM | Admit: 2023-04-06 | Discharge: 2023-04-13 | DRG: 378 | Disposition: A | Discharge status: Home / Self Care | Payer: PPO | Attending: Internal Medicine | Admitting: Internal Medicine

## 2023-04-06 ENCOUNTER — Ambulatory Visit (INDEPENDENT_AMBULATORY_CARE_PROVIDER_SITE_OTHER): Payer: PPO | Admitting: Internal Medicine

## 2023-04-06 ENCOUNTER — Encounter: Payer: Self-pay | Admitting: Internal Medicine

## 2023-04-06 ENCOUNTER — Encounter (HOSPITAL_COMMUNITY): Payer: Self-pay

## 2023-04-06 VITALS — BP 116/72 | HR 104 | Temp 98.0°F | Ht 64.0 in | Wt 165.0 lb

## 2023-04-06 DIAGNOSIS — T39015A Adverse effect of aspirin, initial encounter: Secondary | ICD-10-CM | POA: Diagnosis present

## 2023-04-06 DIAGNOSIS — I4819 Other persistent atrial fibrillation: Secondary | ICD-10-CM | POA: Diagnosis not present

## 2023-04-06 DIAGNOSIS — Z7982 Long term (current) use of aspirin: Secondary | ICD-10-CM

## 2023-04-06 DIAGNOSIS — I10 Essential (primary) hypertension: Secondary | ICD-10-CM | POA: Diagnosis not present

## 2023-04-06 DIAGNOSIS — K2100 Gastro-esophageal reflux disease with esophagitis, without bleeding: Secondary | ICD-10-CM | POA: Diagnosis present

## 2023-04-06 DIAGNOSIS — I129 Hypertensive chronic kidney disease with stage 1 through stage 4 chronic kidney disease, or unspecified chronic kidney disease: Secondary | ICD-10-CM | POA: Diagnosis present

## 2023-04-06 DIAGNOSIS — Z9884 Bariatric surgery status: Secondary | ICD-10-CM

## 2023-04-06 DIAGNOSIS — E1151 Type 2 diabetes mellitus with diabetic peripheral angiopathy without gangrene: Secondary | ICD-10-CM | POA: Diagnosis not present

## 2023-04-06 DIAGNOSIS — I4892 Unspecified atrial flutter: Secondary | ICD-10-CM | POA: Diagnosis present

## 2023-04-06 DIAGNOSIS — D509 Iron deficiency anemia, unspecified: Secondary | ICD-10-CM | POA: Diagnosis present

## 2023-04-06 DIAGNOSIS — T45515A Adverse effect of anticoagulants, initial encounter: Secondary | ICD-10-CM | POA: Diagnosis present

## 2023-04-06 DIAGNOSIS — K921 Melena: Secondary | ICD-10-CM | POA: Diagnosis not present

## 2023-04-06 DIAGNOSIS — D1779 Benign lipomatous neoplasm of other sites: Secondary | ICD-10-CM | POA: Diagnosis present

## 2023-04-06 DIAGNOSIS — N182 Chronic kidney disease, stage 2 (mild): Secondary | ICD-10-CM | POA: Diagnosis not present

## 2023-04-06 DIAGNOSIS — T45525A Adverse effect of antithrombotic drugs, initial encounter: Secondary | ICD-10-CM | POA: Diagnosis present

## 2023-04-06 DIAGNOSIS — I7025 Atherosclerosis of native arteries of other extremities with ulceration: Secondary | ICD-10-CM | POA: Diagnosis not present

## 2023-04-06 DIAGNOSIS — D62 Acute posthemorrhagic anemia: Secondary | ICD-10-CM | POA: Diagnosis present

## 2023-04-06 DIAGNOSIS — K5904 Chronic idiopathic constipation: Secondary | ICD-10-CM | POA: Diagnosis not present

## 2023-04-06 DIAGNOSIS — M48062 Spinal stenosis, lumbar region with neurogenic claudication: Secondary | ICD-10-CM | POA: Diagnosis present

## 2023-04-06 DIAGNOSIS — E1122 Type 2 diabetes mellitus with diabetic chronic kidney disease: Secondary | ICD-10-CM | POA: Diagnosis present

## 2023-04-06 DIAGNOSIS — I4891 Unspecified atrial fibrillation: Secondary | ICD-10-CM | POA: Diagnosis present

## 2023-04-06 DIAGNOSIS — I739 Peripheral vascular disease, unspecified: Secondary | ICD-10-CM | POA: Diagnosis present

## 2023-04-06 DIAGNOSIS — Z885 Allergy status to narcotic agent status: Secondary | ICD-10-CM

## 2023-04-06 DIAGNOSIS — T82898A Other specified complication of vascular prosthetic devices, implants and grafts, initial encounter: Secondary | ICD-10-CM | POA: Diagnosis present

## 2023-04-06 DIAGNOSIS — Z79899 Other long term (current) drug therapy: Secondary | ICD-10-CM

## 2023-04-06 DIAGNOSIS — Z7985 Long-term (current) use of injectable non-insulin antidiabetic drugs: Secondary | ICD-10-CM

## 2023-04-06 DIAGNOSIS — E785 Hyperlipidemia, unspecified: Secondary | ICD-10-CM | POA: Diagnosis not present

## 2023-04-06 DIAGNOSIS — D649 Anemia, unspecified: Secondary | ICD-10-CM | POA: Diagnosis not present

## 2023-04-06 DIAGNOSIS — Z87891 Personal history of nicotine dependence: Secondary | ICD-10-CM

## 2023-04-06 DIAGNOSIS — Z800 Family history of malignant neoplasm of digestive organs: Secondary | ICD-10-CM

## 2023-04-06 DIAGNOSIS — I480 Paroxysmal atrial fibrillation: Secondary | ICD-10-CM | POA: Diagnosis present

## 2023-04-06 DIAGNOSIS — E039 Hypothyroidism, unspecified: Secondary | ICD-10-CM | POA: Diagnosis present

## 2023-04-06 DIAGNOSIS — Z880 Allergy status to penicillin: Secondary | ICD-10-CM

## 2023-04-06 DIAGNOSIS — Z841 Family history of disorders of kidney and ureter: Secondary | ICD-10-CM

## 2023-04-06 DIAGNOSIS — Z83438 Family history of other disorder of lipoprotein metabolism and other lipidemia: Secondary | ICD-10-CM

## 2023-04-06 DIAGNOSIS — K922 Gastrointestinal hemorrhage, unspecified: Secondary | ICD-10-CM | POA: Diagnosis present

## 2023-04-06 DIAGNOSIS — Z7901 Long term (current) use of anticoagulants: Secondary | ICD-10-CM

## 2023-04-06 DIAGNOSIS — Z881 Allergy status to other antibiotic agents status: Secondary | ICD-10-CM

## 2023-04-06 DIAGNOSIS — D6832 Hemorrhagic disorder due to extrinsic circulating anticoagulants: Secondary | ICD-10-CM | POA: Diagnosis present

## 2023-04-06 DIAGNOSIS — Z7902 Long term (current) use of antithrombotics/antiplatelets: Secondary | ICD-10-CM

## 2023-04-06 DIAGNOSIS — K5521 Angiodysplasia of colon with hemorrhage: Principal | ICD-10-CM | POA: Diagnosis present

## 2023-04-06 DIAGNOSIS — K22711 Barrett's esophagus with high grade dysplasia: Secondary | ICD-10-CM | POA: Diagnosis present

## 2023-04-06 DIAGNOSIS — Z8249 Family history of ischemic heart disease and other diseases of the circulatory system: Secondary | ICD-10-CM

## 2023-04-06 DIAGNOSIS — Z833 Family history of diabetes mellitus: Secondary | ICD-10-CM

## 2023-04-06 DIAGNOSIS — K5900 Constipation, unspecified: Secondary | ICD-10-CM | POA: Diagnosis not present

## 2023-04-06 DIAGNOSIS — K5731 Diverticulosis of large intestine without perforation or abscess with bleeding: Secondary | ICD-10-CM | POA: Diagnosis present

## 2023-04-06 DIAGNOSIS — F332 Major depressive disorder, recurrent severe without psychotic features: Secondary | ICD-10-CM | POA: Diagnosis present

## 2023-04-06 DIAGNOSIS — Z823 Family history of stroke: Secondary | ICD-10-CM

## 2023-04-06 DIAGNOSIS — R5383 Other fatigue: Secondary | ICD-10-CM | POA: Diagnosis not present

## 2023-04-06 DIAGNOSIS — Z888 Allergy status to other drugs, medicaments and biological substances status: Secondary | ICD-10-CM

## 2023-04-06 DIAGNOSIS — Z794 Long term (current) use of insulin: Secondary | ICD-10-CM

## 2023-04-06 DIAGNOSIS — Z1152 Encounter for screening for COVID-19: Secondary | ICD-10-CM

## 2023-04-06 LAB — GLUCOSE, CAPILLARY: Glucose-Capillary: 193 mg/dL — ABNORMAL HIGH (ref 70–99)

## 2023-04-06 LAB — CBC WITH DIFFERENTIAL/PLATELET
Abs Immature Granulocytes: 0.04 10*3/uL (ref 0.00–0.07)
Basophils Absolute: 0.1 10*3/uL (ref 0.0–0.1)
Basophils Absolute: 0.1 10*3/uL (ref 0.0–0.1)
Basophils Relative: 0.5 % (ref 0.0–3.0)
Basophils Relative: 1 %
Eosinophils Absolute: 0.1 10*3/uL (ref 0.0–0.7)
Eosinophils Absolute: 0.1 10*3/uL (ref 0.0–0.5)
Eosinophils Relative: 0.6 % (ref 0.0–5.0)
Eosinophils Relative: 1 %
HCT: 21.3 % — CL (ref 36.0–46.0)
HCT: 22.2 % — ABNORMAL LOW (ref 36.0–46.0)
Hemoglobin: 6.4 g/dL — CL (ref 12.0–15.0)
Hemoglobin: 6.3 g/dL — CL (ref 12.0–15.0)
Immature Granulocytes: 0 %
Lymphocytes Relative: 23.3 % (ref 12.0–46.0)
Lymphocytes Relative: 19 %
Lymphs Abs: 2.7 10*3/uL (ref 0.7–4.0)
Lymphs Abs: 1.9 10*3/uL (ref 0.7–4.0)
MCH: 23.7 pg — ABNORMAL LOW (ref 26.0–34.0)
MCHC: 30.3 g/dL (ref 30.0–36.0)
MCHC: 28.4 g/dL — ABNORMAL LOW (ref 30.0–36.0)
MCV: 79.5 fL (ref 78.0–100.0)
MCV: 83.5 fL (ref 80.0–100.0)
Monocytes Absolute: 0.8 10*3/uL (ref 0.1–1.0)
Monocytes Absolute: 0.6 10*3/uL (ref 0.1–1.0)
Monocytes Relative: 7.3 % (ref 3.0–12.0)
Monocytes Relative: 6 %
Neutro Abs: 8 10*3/uL — ABNORMAL HIGH (ref 1.4–7.7)
Neutro Abs: 7.1 10*3/uL (ref 1.7–7.7)
Neutrophils Relative %: 68.3 % (ref 43.0–77.0)
Neutrophils Relative %: 73 %
Platelets: 348 10*3/uL (ref 150.0–400.0)
Platelets: 365 10*3/uL (ref 150–400)
RBC: 2.67 Mil/uL — ABNORMAL LOW (ref 3.87–5.11)
RBC: 2.66 MIL/uL — ABNORMAL LOW (ref 3.87–5.11)
RDW: 17.8 % — ABNORMAL HIGH (ref 11.5–15.5)
RDW: 17.4 % — ABNORMAL HIGH (ref 11.5–15.5)
WBC: 11.6 10*3/uL — ABNORMAL HIGH (ref 4.0–10.5)
WBC: 9.7 10*3/uL (ref 4.0–10.5)
nRBC: 0.5 % — ABNORMAL HIGH (ref 0.0–0.2)

## 2023-04-06 LAB — TSH: TSH: 0.56 u[IU]/mL (ref 0.35–5.50)

## 2023-04-06 LAB — COMPREHENSIVE METABOLIC PANEL
ALT: 8 U/L (ref 0–35)
ALT: 11 U/L (ref 0–44)
AST: 8 U/L (ref 0–37)
AST: 14 U/L — ABNORMAL LOW (ref 15–41)
Albumin: 3.8 g/dL (ref 3.5–5.2)
Albumin: 3.1 g/dL — ABNORMAL LOW (ref 3.5–5.0)
Alkaline Phosphatase: 62 U/L (ref 39–117)
Alkaline Phosphatase: 52 U/L (ref 38–126)
Anion gap: 10 (ref 5–15)
BUN: 14 mg/dL (ref 6–23)
BUN: 14 mg/dL (ref 8–23)
CO2: 22 meq/L (ref 19–32)
CO2: 19 mmol/L — ABNORMAL LOW (ref 22–32)
Calcium: 8.9 mg/dL (ref 8.4–10.5)
Calcium: 9.1 mg/dL (ref 8.9–10.3)
Chloride: 108 meq/L (ref 96–112)
Chloride: 112 mmol/L — ABNORMAL HIGH (ref 98–111)
Creatinine, Ser: 0.9 mg/dL (ref 0.40–1.20)
Creatinine, Ser: 1.05 mg/dL — ABNORMAL HIGH (ref 0.44–1.00)
GFR, Estimated: 59 mL/min — ABNORMAL LOW (ref >60)
GFR: 66.8 mL/min (ref >60.00)
Glucose, Bld: 202 mg/dL — ABNORMAL HIGH (ref 70–99)
Glucose, Bld: 239 mg/dL — ABNORMAL HIGH (ref 70–99)
Potassium: 3.8 meq/L (ref 3.5–5.1)
Potassium: 4.6 mmol/L (ref 3.5–5.1)
Sodium: 141 meq/L (ref 135–145)
Sodium: 141 mmol/L (ref 135–145)
Total Bilirubin: 0.3 mg/dL (ref 0.2–1.2)
Total Bilirubin: 0.4 mg/dL (ref <1.2)
Total Protein: 6.5 g/dL (ref 6.0–8.3)
Total Protein: 5.9 g/dL — ABNORMAL LOW (ref 6.5–8.1)

## 2023-04-06 LAB — APTT: aPTT: 37 s — ABNORMAL HIGH (ref 24–36)

## 2023-04-06 LAB — POC OCCULT BLOOD, ED: Fecal Occult Bld: NEGATIVE

## 2023-04-06 LAB — IBC PANEL
Iron: 14 ug/dL — ABNORMAL LOW (ref 42–145)
Saturation Ratios: 3.5 % — ABNORMAL LOW (ref 20.0–50.0)
TIBC: 394.8 ug/dL (ref 250.0–450.0)
Transferrin: 282 mg/dL (ref 212.0–360.0)

## 2023-04-06 LAB — FERRITIN: Ferritin: 10.8 ng/mL (ref 10.0–291.0)

## 2023-04-06 LAB — PREPARE RBC (CROSSMATCH)

## 2023-04-06 MED ORDER — DILTIAZEM HCL 25 MG/5ML IV SOLN
10.0000 mg | Freq: Once | INTRAVENOUS | Status: AC
Start: 2023-04-06 — End: 2023-04-06
  Administered 2023-04-06: 10 mg via INTRAVENOUS
  Filled 2023-04-06: qty 5

## 2023-04-06 MED ORDER — PANTOPRAZOLE SODIUM 40 MG IV SOLR
80.0000 mg | Freq: Two times a day (BID) | INTRAVENOUS | Status: DC
  Filled 2023-04-06 (×2): qty 20

## 2023-04-06 MED ORDER — NEBIVOLOL HCL 10 MG PO TABS
10.0000 mg | ORAL_TABLET | Freq: Every day | ORAL | Status: DC
  Administered 2023-04-07: 10 mg via ORAL
  Filled 2023-04-06 (×2): qty 1

## 2023-04-06 MED ORDER — SODIUM CHLORIDE 0.9 % IV BOLUS
1000.0000 mL | Freq: Once | INTRAVENOUS | Status: AC
  Administered 2023-04-06: 1000 mL via INTRAVENOUS

## 2023-04-06 MED ORDER — SODIUM CHLORIDE 0.9% FLUSH
3.0000 mL | Freq: Two times a day (BID) | INTRAVENOUS | Status: DC
  Administered 2023-04-06 – 2023-04-13 (×10): 3 mL via INTRAVENOUS

## 2023-04-06 MED ORDER — ROSUVASTATIN CALCIUM 5 MG PO TABS
5.0000 mg | ORAL_TABLET | Freq: Every day | ORAL | Status: DC
  Administered 2023-04-07 – 2023-04-13 (×6): 5 mg via ORAL
  Filled 2023-04-06 (×8): qty 1

## 2023-04-06 MED ORDER — ACETAMINOPHEN 650 MG RE SUPP: 650.0000 mg | Freq: Four times a day (QID) | RECTAL | Status: DC | PRN

## 2023-04-06 MED ORDER — EZETIMIBE 10 MG PO TABS
10.0000 mg | ORAL_TABLET | Freq: Every day | ORAL | Status: DC
  Administered 2023-04-07 – 2023-04-13 (×6): 10 mg via ORAL
  Filled 2023-04-06 (×8): qty 1

## 2023-04-06 MED ORDER — LATANOPROST 0.005 % OP SOLN
1.0000 [drp] | Freq: Every day | OPHTHALMIC | Status: DC
  Administered 2023-04-06 – 2023-04-12 (×7): 1 [drp] via OPHTHALMIC
  Filled 2023-04-06: qty 2.5

## 2023-04-06 MED ORDER — SODIUM CHLORIDE 0.9% IV SOLUTION: Freq: Once | INTRAVENOUS | Status: DC

## 2023-04-06 MED ORDER — PANTOPRAZOLE SODIUM 40 MG IV SOLR
80.0000 mg | Freq: Once | INTRAVENOUS | Status: AC
  Administered 2023-04-06: 80 mg via INTRAVENOUS
  Filled 2023-04-06: qty 20

## 2023-04-06 MED ORDER — INSULIN GLARGINE-YFGN 100 UNIT/ML ~~LOC~~ SOLN
25.0000 [IU] | Freq: Every day | SUBCUTANEOUS | Status: DC
  Administered 2023-04-06 – 2023-04-12 (×7): 25 [IU] via SUBCUTANEOUS
  Filled 2023-04-06 (×8): qty 0.25

## 2023-04-06 MED ORDER — DORZOLAMIDE HCL-TIMOLOL MAL 2-0.5 % OP SOLN
1.0000 [drp] | Freq: Two times a day (BID) | OPHTHALMIC | Status: DC
  Administered 2023-04-06 – 2023-04-13 (×13): 1 [drp] via OPHTHALMIC
  Filled 2023-04-06: qty 10

## 2023-04-06 MED ORDER — DILTIAZEM HCL ER COATED BEADS 180 MG PO CP24
180.0000 mg | ORAL_CAPSULE | Freq: Every day | ORAL | Status: DC
  Administered 2023-04-07 – 2023-04-08 (×2): 180 mg via ORAL
  Filled 2023-04-06 (×2): qty 1

## 2023-04-06 MED ORDER — INSULIN ASPART 100 UNIT/ML IJ SOLN
0.0000 [IU] | INTRAMUSCULAR | Status: DC
  Administered 2023-04-06: 3 [IU] via SUBCUTANEOUS
  Administered 2023-04-07: 2 [IU] via SUBCUTANEOUS
  Administered 2023-04-07 – 2023-04-09 (×6): 3 [IU] via SUBCUTANEOUS
  Administered 2023-04-10: 2 [IU] via SUBCUTANEOUS
  Administered 2023-04-10: 5 [IU] via SUBCUTANEOUS
  Administered 2023-04-10: 3 [IU] via SUBCUTANEOUS

## 2023-04-06 MED ORDER — ACETAMINOPHEN 325 MG PO TABS: 650.0000 mg | ORAL_TABLET | Freq: Four times a day (QID) | ORAL | Status: DC | PRN

## 2023-04-06 MED ORDER — POLYETHYLENE GLYCOL 3350 17 G PO PACK: 17.0000 g | PACK | Freq: Every day | ORAL | Status: DC | PRN

## 2023-04-06 NOTE — Telephone Encounter (Signed)
CRITICAL VALUE STICKER  CRITICAL VALUE: HBg 6.4 HCT 21.3  DATE & TIME NOTIFIED: 1240  MESSENGER (representative from lab):Karen at Lab  MD NOTIFIED: Sent to [place] who saw patient today and PCP

## 2023-04-06 NOTE — ED Notes (Signed)
ED TO INPATIENT HANDOFF REPORT  ED Nurse Name and Phone #: Greer Pickerel Name/Age/Gender Theresa Harrington 67 y.o. female Room/Bed: 006C/006C  Code Status   Code Status: Full Code  Home/SNF/Other Home Patient oriented to: self, place, time, and situation Is this baseline? Yes   Triage Complete: Triage complete  Chief Complaint Acute GI bleeding [K92.2]  Triage Note Pt states she had a femoral-popliteal bypass on 11/8. Since then she has been taking 3 blood thinners. Pt states for past few weeks she has been increasingly fatigued. Pt was seen and had labwork drawn and was told her hgb was 6 and she needed a blood transfusion. Pt states she also feels like she is in afib.    Allergies Allergies  Allergen Reactions   Amlodipine Swelling   Atorvastatin Other (See Comments)    Muscle aches    Ampicillin Nausea And Vomiting and Other (See Comments)   Codeine Nausea And Vomiting    Ask patient   Lisinopril Cough        Penicillins Nausea And Vomiting    Did it involve swelling of the face/tongue/throat, SOB, or low BP? No Did it involve sudden or severe rash/hives, skin peeling, or any reaction on the inside of your mouth or nose? No Did you need to seek medical attention at a hospital or doctor's office? No When did it last happen?      20+ years If all above answers are "NO", may proceed with cephalosporin use.    Januvia [Sitagliptin] Nausea And Vomiting    Level of Care/Admitting Diagnosis ED Disposition     ED Disposition  Admit   Condition  --   Comment  Hospital Area[facility] [[id number]]  Level of Care: Progressive [102]  Admit to Progressive based on following criteria: CARDIOVASCULAR & THORACIC of moderate stability with acute coronary syndrome symptoms/low risk myocardial infarction/hypertensive urgency/arrhythmias/heart failure potentially compromising stability and stable post cardiovascular intervention patients.  Admit to Progressive based on  following criteria: GI, ENDOCRINE disease patients with GI bleeding, acute liver failure or pancreatitis, stable with diabetic ketoacidosis or thyrotoxicosis (hypothyroid) state.  May place patient in observation at [facility] or [facility] if equivalent level of care is available:: No  Covid Evaluation: Asymptomatic - no recent exposure (last 10 days) testing not required  Diagnosis: Acute GI bleeding [253168]  Admitting Physician: Synetta Fail [[id number]]  Attending Physician: Synetta Fail [[id number]]          B Medical/Surgery History Past Medical History:  Diagnosis Date   Allergy    Anxiety    Pt. denies   Arthritis    right index figer   Asymptomatic cholelithiasis    Atherosclerosis of aorta (HCC)    Atrial flutter, paroxysmal (HCC) 02/20/2023   Cataract    Clotting disorder (HCC)    Gallstones 06/2013   GERD (gastroesophageal reflux disease)    Glaucoma    "A LITTLE BIT"   History of kidney stones    lithotrispy   Hyperlipidemia    Hypertension    Hypothyroidism    no meds   Iron deficiency anemia    PAD (peripheral artery disease) (HCC)    PAF (paroxysmal atrial fibrillation) (HCC)    Peripheral arterial occlusive disease (HCC)    lower extremities   Pneumonia    PONV (postoperative nausea and vomiting)    Right ureteral stone    Type 2 diabetes mellitus (HCC)    Type   Vitamin B  12 deficiency    Wears glasses    Past Surgical History:  Procedure Laterality Date   ABDOMINAL AORTOGRAM W/LOWER EXTREMITY N/A 03/09/2020   Procedure: ABDOMINAL AORTOGRAM W/LOWER EXTREMITY;  Surgeon: Nada Libman, MD;  [facility];  Service: Cardiovascular;  Laterality: N/A;   ABDOMINAL AORTOGRAM W/LOWER EXTREMITY N/A 03/06/2023   Procedure: ABDOMINAL AORTOGRAM W/LOWER EXTREMITY;  Surgeon: Nada Libman, MD;  [facility]: [facility];  Service: Cardiovascular;  Laterality: N/A;   AMPUTATION Left 07/16/2014   Procedure: LEFT SECOND TOE  AMPUTATION;  Surgeon: Nada Libman, MD;  Location: [facility] OR;  Service: Vascular;  Laterality: Left;  With Nerve block   ATRIAL FIBRILLATION ABLATION N/A 08/02/2021   Procedure: ATRIAL FIBRILLATION ABLATION;  Surgeon: Regan Lemming, MD;  [facility]: [facility];  Service: Cardiovascular;  Laterality: N/A;   AUGMENTATION MAMMAPLASTY     BELPHAROPTOSIS REPAIR     eyelid lift   BIOPSY  10/14/2018   Procedure: BIOPSY;  Surgeon: Meridee Score Netty Starring., MD;  Location: [facility] ENDOSCOPY;  Service: Gastroenterology;;   BIOPSY  08/04/2019   Procedure: BIOPSY;  Surgeon: Lemar Lofty., MD;  Location: [facility] ENDOSCOPY;  Service: Gastroenterology;;   CARDIOVERSION N/A 09/19/2019   Procedure: CARDIOVERSION;  Surgeon: Jodelle Red, MD;  Location: [facility] ENDOSCOPY;  Service: Cardiovascular;  Laterality: N/A;   COLONOSCOPY     COMBINED AUGMENTATION MAMMAPLASTY AND ABDOMINOPLASTY  2009   W/  BILATERAL  THIGH LIFT   CYSTO/  RIGHT URETERAL STENT PLACEMENT  12/30/2010   CYSTOSCOPY WITH RETROGRADE PYELOGRAM, URETEROSCOPY AND STENT PLACEMENT Right 10/07/2013   Procedure: CYSTOSCOPY WITH RETROGRADE PYELOGRAM, right URETEROSCOPY AND STENT PLACEMENT, stone extraction;  Surgeon: Danae Chen, MD;  Location: [facility];  Service: Urology;  Laterality: Right;   CYSTOSCOPY WITH STENT PLACEMENT Right 06/04/2013   Procedure: CYSTOSCOPY WITH STENT PLACEMENT;  Surgeon: Marcine Matar, MD;  Location: [facility] ORS;  Service: Urology;  Laterality: Right;   DILATATION & CURETTAGE/HYSTEROSCOPY WITH MYOSURE N/A 04/27/2015   Procedure: DILATATION & CURETTAGE/HYSTEROSCOPY WITH MYOSURE;  Surgeon: Ok Edwards, MD;  Location: [facility] ORS;  Service: Gynecology;  Laterality: N/A;   ENDARTERECTOMY FEMORAL Left 06/08/2014   Procedure: Left Leg Common Femoral and External Iliac  Endartarectomy with patch Angioplasty;  Surgeon: Larina Earthly, MD;  Location[facility] OR;  Service: Vascular;  Laterality: Left;    ESOPHAGOGASTRODUODENOSCOPY N/A 10/14/2018   Procedure: ESOPHAGOGASTRODUODENOSCOPY (EGD);  Surgeon: Lemar Lofty., MD;  Location: [facility] ENDOSCOPY;  Service: Gastroenterology;  Laterality: N/A;   ESOPHAGOGASTRODUODENOSCOPY (EGD) WITH PROPOFOL N/A 11/06/2018   Procedure: ESOPHAGOGASTRODUODENOSCOPY (EGD) WITH PROPOFOL;  Surgeon: Meridee Score Netty Starring., MD;  Location: [facility] ENDOSCOPY;  Service: Gastroenterology;  Laterality: N/A;  RFA   ESOPHAGOGASTRODUODENOSCOPY (EGD) WITH PROPOFOL N/A 02/12/2019   Procedure: ESOPHAGOGASTRODUODENOSCOPY (EGD) WITH PROPOFOL;  Surgeon: Meridee Score Netty Starring., MD;  Location: [facility] ENDOSCOPY;  Service: Gastroenterology;  Laterality: N/A;   ESOPHAGOGASTRODUODENOSCOPY (EGD) WITH PROPOFOL N/A 08/04/2019   Procedure: ESOPHAGOGASTRODUODENOSCOPY (EGD) WITH PROPOFOL;  Surgeon: Meridee Score Netty Starring., MD;  Location: [facility] ENDOSCOPY;  Service: Gastroenterology;  Laterality: N/A;  WITH RFA   EXTRACORPOREAL SHOCK WAVE LITHOTRIPSY Right 08-04-2013//   06-23-2013//   01-16-2011   EYE SURGERY Bilateral    cataract   FEMORAL-POPLITEAL BYPASS GRAFT Left 06/08/2014   Procedure: Left Leg Femoral -Popliteal Bypass Graft;  Surgeon: Larina Earthly, MD;  Location: [facility] OR;  Service: Vascular;  Laterality: Left;   FEMORAL-POPLITEAL BYPASS GRAFT Left 06/09/2014   Procedure: Left Femoral and  Popliteal Exposure; Left Femoral to Anterior Tibial Bypass Graft using Propaten 6mm by 80cm Goretex Graft; Left Tibial Endarterectomy; Left Femoraland Popliteal Thrombectomy ;  Surgeon: Nada Libman, MD;  Location: [facility] OR;  Service: Vascular;  Laterality: Left;   FEMORAL-TIBIAL BYPASS GRAFT Right 03/09/2023   Procedure: RIGHT FEMORAL-POSTERIOR TIBIAL ARTERY BYPASS USING X 80CM PROPATEN GRAFT;  Surgeon: Nada Libman, MD;  Location[facility] OR;  Service: Vascular;  Laterality: Right;   GI RADIOFREQUENCY ABLATION N/A 11/06/2018   Procedure: GI RADIOFREQUENCY ABLATION;  Surgeon: Lemar Lofty., MD;   Location: [facility] ENDOSCOPY;  Service: Gastroenterology;  Laterality: N/A;   GI RADIOFREQUENCY ABLATION N/A 02/12/2019   Procedure: GI RADIOFREQUENCY ABLATION;  Surgeon: Meridee Score Netty Starring., MD;  Location: [facility] ENDOSCOPY;  Service: Gastroenterology;  Laterality: N/A;   GI RADIOFREQUENCY ABLATION N/A 08/04/2019   Procedure: GI RADIOFREQUENCY ABLATION;  Surgeon: Meridee Score Netty Starring., MD;  Location: [facility] ENDOSCOPY;  Service: Gastroenterology;  Laterality: N/A;   HOLMIUM LASER APPLICATION Right 10/07/2013   Procedure: HOLMIUM LASER APPLICATION;  Surgeon: Danae Chen, MD;  Location: [facility];  Service: Urology;  Laterality: Right;   KIDNEY STONE SURGERY  07/2013   1-2 stones   LAPAROSCOPIC GASTRIC BANDING  05/29/2005   LITHOTRIPSY  2-3 times   LOWER EXTREMITY ANGIOGRAM N/A 06/04/2014   Procedure: LOWER EXTREMITY ANGIOGRAM;  Surgeon: Nada Libman, MD;  Location: [facility] CATH LAB;  Service: Cardiovascular;  Laterality: N/A;   ORIF FIFTH METACARPAL FX  RIGHT HAND  04/21/2002   PATCH ANGIOPLASTY Right 03/09/2023   Procedure: VEIN PATCH ANGIOPLASTY TO RIGHT FEMORAL ARTERY;  Surgeon: Nada Libman, MD;  Location[facility] OR;  Service: Vascular;  Laterality: Right;   POLYPECTOMY     REVISION AND RE-SITING LAP-BAND PORT  04/08/2010   W/  UPPER EGD   RIGHT KNEE PATELLECTOMY W/ REPAIR OF EXTENSOR MECHANISM  04/14/2002   TEE WITHOUT CARDIOVERSION N/A 12/24/2019   Procedure: TRANSESOPHAGEAL ECHOCARDIOGRAM (TEE);  Surgeon: Quintella Reichert, MD;  Location: [facility] ENDOSCOPY;  Service: Cardiovascular;  Laterality: N/A;   Toenail removed Left 05/21/2014   2nd toenail-  Dr. Elvin So     A IV Location/Drains/Wounds Patient Lines/Drains/Airways Status     Active Line/Drains/Airways     Name Placement date Placement time Site Days   Peripheral IV 03/09/23 18 G Distal;Posterior;Right Forearm 03/09/23  1115  Forearm  28   Peripheral IV 04/06/23 20 G Right Antecubital 04/06/23  1750  Antecubital  less than  1   Peripheral IV 04/06/23 22 G Left;Posterior Hand 04/06/23  1953  Hand  less than 1   Ureteral Drain/Stent Right ureter 7 Fr. 06/04/13  2245  Right ureter  3593   Ureteral Drain/Stent Right ureter 6 Fr. 10/07/13  1118  Right ureter  3468   Wound / Incision (Open or Dehisced) 06/15/14 Diabetic ulcer Toe (Comment  which one) Left  06/15/14  0218  Toe (Comment  which one)  3217   Wound / Incision (Open or Dehisced) 03/09/23 Non-pressure wound Toe (Comment  which one) Anterior;Right black, dry 03/09/23  2000  Toe (Comment  which one)  28   Wound / Incision (Open or Dehisced) 03/09/23 Toe (Comment  which one) Anterior;Left dry and brown 03/09/23  2000  Toe (Comment  which one)  28            Intake/Output Last 24 hours No intake or output data in the 24 hours ending 04/06/23 2009  Labs/Imaging Results for orders placed  or performed during the hospital encounter of 04/06/23 (from the past 48 hour(s))  Type and screen [facility]     Status: None (Preliminary result)   Collection Time: 04/06/23  3:14 PM  Result Value Ref Range   ABO/RH(D) A POS    Antibody Screen NEG    Sample Expiration 04/09/2023,2359    Unit Number [id number]    Blood Component Type RED CELLS,LR    Unit division 00    Status of Unit ISSUED    Transfusion Status OK TO TRANSFUSE    Crossmatch Result      Compatible Performed at [facility] Lab, [address], [place], [place] [address]   CBC with Differential     Status: Abnormal   Collection Time: 04/06/23  3:14 PM  Result Value Ref Range   WBC 9.7 4.0 - 10.5 K/uL   RBC 2.66 (L) 3.87 - 5.11 MIL/uL   Hemoglobin 6.3 (LL) 12.0 - 15.0 g/dL    Comment: REPEATED TO VERIFY THIS CRITICAL RESULT HAS VERIFIED AND BEEN CALLED TO Jess Barters, RN BY [employer] ON 12 06 2024 AT 1625, AND HAS BEEN READ BACK.     HCT 22.2 (L) 36.0 - 46.0 %   MCV 83.5 80.0 - 100.0 fL   MCH 23.7 (L) 26.0 - 34.0 pg   MCHC 28.4 (L) 30.0 - 36.0 g/dL   RDW  42.5 (H) 95.6 - 15.5 %   Platelets 365 150 - 400 K/uL   nRBC 0.5 (H) 0.0 - 0.2 %   Neutrophils Relative % 73 %   Neutro Abs 7.1 1.7 - 7.7 K/uL   Lymphocytes Relative 19 %   Lymphs Abs 1.9 0.7 - 4.0 K/uL   Monocytes Relative 6 %   Monocytes Absolute 0.6 0.1 - 1.0 K/uL   Eosinophils Relative 1 %   Eosinophils Absolute 0.1 0.0 - 0.5 K/uL   Basophils Relative 1 %   Basophils Absolute 0.1 0.0 - 0.1 K/uL   Immature Granulocytes 0 %   Abs Immature Granulocytes 0.04 0.00 - 0.07 K/uL    Comment: Performed at [facility], [address], [place], [place] [address]  APTT     Status: Abnormal   Collection Time: 04/06/23  3:14 PM  Result Value Ref Range   aPTT 37 (H) 24 - 36 seconds    Comment:        IF BASELINE aPTT IS ELEVATED, SUGGEST PATIENT RISK ASSESSMENT BE USED TO DETERMINE APPROPRIATE ANTICOAGULANT THERAPY. Performed at [facility], [address], [place], [place] [address]   Comprehensive metabolic panel     Status: Abnormal   Collection Time: 04/06/23  3:14 PM  Result Value Ref Range   Sodium 141 135 - 145 mmol/L   Potassium 4.6 3.5 - 5.1 mmol/L   Chloride 112 (H) 98 - 111 mmol/L   CO2 19 (L) 22 - 32 mmol/L   Glucose, Bld 239 (H) 70 - 99 mg/dL    Comment: Glucose reference range applies only to samples taken after fasting for at least 8 hours.   BUN 14 8 - 23 mg/dL   Creatinine, Ser 5.18 (H) 0.44 - 1.00 mg/dL   Calcium 9.1 8.9 - 84.1 mg/dL   Total Protein 5.9 (L) 6.5 - 8.1 g/dL   Albumin 3.1 (L) 3.5 - 5.0 g/dL   AST 14 (L) 15 - 41 U/L   ALT 11 0 - 44 U/L   Alkaline Phosphatase 52 38 -  126 U/L   Total Bilirubin 0.4 <1.2 mg/dL   GFR, Estimated 59 (L) >60 mL/min    Comment: (NOTE) Calculated using the CKD-EPI Creatinine Equation (2021)    Anion gap 10 5 - 15    Comment: Performed at [facility], [address] [place], [place] [address]  Prepare RBC (crossmatch)     Status: None   Collection Time: 04/06/23  5:00 PM  Result Value Ref Range    Order Confirmation      ORDER PROCESSED BY BLOOD BANK Performed at [facility], [address], [place], [place] [address]   POC occult blood, ED     Status: None   Collection Time: 04/06/23  6:51 PM  Result Value Ref Range   Fecal Occult Bld NEGATIVE NEGATIVE   *Note: Due to a large number of results and/or encounters for the requested time period, some results have not been displayed. A complete set of results can be found in Results Review.   No results found.  Pending Labs Unresulted Labs (From admission, onward)     Start     Ordered   04/07/23 0500  CBC  Tomorrow morning,   R        04/06/23 1851   04/07/23 0500  Comprehensive metabolic panel  Tomorrow morning,   R        04/06/23 1851   04/06/23 1845  HIV Antibody (routine testing w rflx)  (HIV Antibody (Routine testing w reflex) panel)  Once,   R        04/06/23 1851            Vitals/Pain Today's Vitals   04/06/23 1845 04/06/23 1900 04/06/23 1945 04/06/23 2006  BP: 122/75 131/73 110/66   Pulse: (!) 130 (!) 133 (!) 131 (!) 109  Resp: (!) 21 16 (!) 21   Temp:      TempSrc:      SpO2: 100% 100% 100%   Weight:      Height:      PainSc:        Isolation Precautions No active isolations  Medications Medications  0.9 %  sodium chloride infusion (Manually program via Guardrails IV Fluids) ( Intravenous Not Given 04/06/23 1802)  ezetimibe (ZETIA) tablet 10 mg (has no administration in time range)  rosuvastatin (CRESTOR) tablet 5 mg (has no administration in time range)  dorzolamide-timolol (COSOPT) 2-0.5 % ophthalmic solution 1 drop (has no administration in time range)  latanoprost (XALATAN) 0.005 % ophthalmic solution 1 drop (has no administration in time range)  sodium chloride flush (NS) 0.9 % injection 3 mL (has no administration in time range)  acetaminophen (TYLENOL) tablet 650 mg (has no administration in time range)    Or  acetaminophen (TYLENOL) suppository 650 mg (has no administration in time  range)  polyethylene glycol (MIRALAX / GLYCOLAX) packet 17 g (has no administration in time range)  diltiazem (CARDIZEM CD) 24 hr capsule 180 mg (has no administration in time range)  nebivolol (BYSTOLIC) tablet 10 mg (has no administration in time range)  pantoprazole (PROTONIX) injection 80 mg (has no administration in time range)  insulin aspart (novoLOG) injection 0-15 Units (has no administration in time range)  insulin glargine-yfgn (SEMGLEE) injection 25 Units (has no administration in time range)  pantoprazole (PROTONIX) injection 80 mg (80 mg Intravenous Given 04/06/23 1748)  sodium chloride 0.9 % bolus 1,000 mL (1,000 mLs Intravenous New Bag/Given 04/06/23 1956)  diltiazem (CARDIZEM) injection 10 mg (10 mg  Intravenous Given 04/06/23 1954)    Mobility walks     Focused Assessments     R Recommendations: See Admitting Provider Note  Report given to:   Additional Notes:

## 2023-04-06 NOTE — ED Notes (Signed)
Provided pt beverage approved by Dr. Beckey Downing

## 2023-04-06 NOTE — Patient Instructions (Addendum)
      Blood work was ordered.       Medications changes include :   None - take colace daily - can take 3 tabs daily.  Take miralax daily.        Return if symptoms worsen or fail to improve.

## 2023-04-06 NOTE — ED Triage Notes (Signed)
Pt states she had a femoral-popliteal bypass on 11/8. Since then she has been taking 3 blood thinners. Pt states for past few weeks she has been increasingly fatigued. Pt was seen and had labwork drawn and was told her hgb was 6 and she needed a blood transfusion. Pt states she also feels like she is in afib.

## 2023-04-06 NOTE — Progress Notes (Signed)
This encounter was created in error - please disregard.  I called pt and did not get an answer/I left a detailed message that I would try her again.  When I called patient back, she did pick up.  As I was performing the visit, she stops me and say "I have an appt this morning and you can ask me all these questions at that time"!  I explained to her that she was on my schedule this morning as a AWV phone visit and that I have other pt's at the time she will be coming in.  Patient was very agitated and repeated what she said in the beginning of this message and then patient hung the phone up on me.

## 2023-04-06 NOTE — H&P (Signed)
History and Physical   Sequioa Prusak FAO:[id number] DOB: 1955-09-10 DOA: 04/06/2023  PCP: Etta Grandchild, MD   Patient coming from: Home/PCP  Chief Complaint: Abnormal labs, symptomatic anemia  HPI: Theresa Harrington is a 67 y.o. female with medical history significant of hypertension, hyperlipidemia, iron deficiency anemia, PAD, GERD, Barrett's esophagus, atrial fibrillation, diabetes, CKD 2, status post lap band, spinal stenosis, depression presenting with abnormal lab and symptomatic anemia.  Patient underwent femoropopliteal bypass on November 8 and has been doing well from that standpoint after the procedure.  She did, however, noticed some increasing fatigue and increasing dyspnea on exertion for the past week or so.  She was at follow-up visit with her PCP today and had lab work drawn which showed hemoglobin of 6.4 down from previous of 8-9.  She was sent to the ED for further evaluation.  She does endorse some dark stools for the past several days.  Denies any bright red blood per rectum nor any nausea or vomiting.  Further denies chest pain, abdominal pain, constipation, diarrhea.  ED Course: Vital signs in the ED notable for heart rate in the 100s to 130s, respiratory rate in the 20s.  Lab workup included CMP with chloride 112, bicarb 19,Creatinine 1.05 near baseline, glucose 239, protein 5.9, albumin 3.1.  CBC with hemoglobin confirmed to be 6.3 to have a previous baseline of 8-9.  PTT 37.  TSH normal.  FOBT pending.  But dark stool per EDP.  Patient typed and screened in the ED.  Outpatient ferritin 10 and outpatient iron 14.  No imaging in the ED.  Vascular surgery consulted and told EDP will be fine to hold her anticoagulation and antiplatelets.  GI consulted and will see the patient recommending n.p.o. while evaluating for possible endoscopy.  Patient received IV PPI, 1 L IV fluids and 1 unit PRBC in the ED.  Review of Systems: As per HPI otherwise all other systems reviewed and are  negative.  Past Medical History:  Diagnosis Date   Allergy    Anxiety    Pt. denies   Arthritis    right index figer   Asymptomatic cholelithiasis    Atherosclerosis of aorta (HCC)    Atrial flutter, paroxysmal (HCC) 02/20/2023   Cataract    Clotting disorder (HCC)    Gallstones 06/2013   GERD (gastroesophageal reflux disease)    Glaucoma    "A LITTLE BIT"   History of kidney stones    lithotrispy   Hyperlipidemia    Hypertension    Hypothyroidism    no meds   Iron deficiency anemia    PAD (peripheral artery disease) (HCC)    PAF (paroxysmal atrial fibrillation) (HCC)    Peripheral arterial occlusive disease (HCC)    lower extremities   Pneumonia    PONV (postoperative nausea and vomiting)    Right ureteral stone    Type 2 diabetes mellitus (HCC)    Type   Vitamin B 12 deficiency    Wears glasses     Past Surgical History:  Procedure Laterality Date   ABDOMINAL AORTOGRAM W/LOWER EXTREMITY N/A 03/09/2020   Procedure: ABDOMINAL AORTOGRAM W/LOWER EXTREMITY;  Surgeon: Nada Libman, MD;  [facility]: [facility];  Service: Cardiovascular;  Laterality: N/A;   ABDOMINAL AORTOGRAM W/LOWER EXTREMITY N/A 03/06/2023   Procedure: ABDOMINAL AORTOGRAM W/LOWER EXTREMITY;  Surgeon: Nada Libman, MD;  Location: [facility];  Service: Cardiovascular;  Laterality: N/A;   AMPUTATION Left 07/16/2014   Procedure: LEFT SECOND  TOE AMPUTATION;  Surgeon: Nada Libman, MD;  Location: [facility] OR;  Service: Vascular;  Laterality: Left;  With Nerve block   ATRIAL FIBRILLATION ABLATION N/A 08/02/2021   Procedure: ATRIAL FIBRILLATION ABLATION;  Surgeon: Regan Lemming, MD;  Location: [facility];  Service: Cardiovascular;  Laterality: N/A;   AUGMENTATION MAMMAPLASTY     BELPHAROPTOSIS REPAIR     eyelid lift   BIOPSY  10/14/2018   Procedure: BIOPSY;  Surgeon: Meridee Score Netty Starring., MD;  Location: [facility] ENDOSCOPY;  Service: Gastroenterology;;   BIOPSY  08/04/2019    Procedure: BIOPSY;  Surgeon: Lemar Lofty., MD;  Location: [facility] ENDOSCOPY;  Service: Gastroenterology;;   CARDIOVERSION N/A 09/19/2019   Procedure: CARDIOVERSION;  Surgeon: Jodelle Red, MD;  Location: [facility] ENDOSCOPY;  Service: Cardiovascular;  Laterality: N/A;   COLONOSCOPY     COMBINED AUGMENTATION MAMMAPLASTY AND ABDOMINOPLASTY  2009   W/  BILATERAL  THIGH LIFT   CYSTO/  RIGHT URETERAL STENT PLACEMENT  12/30/2010   CYSTOSCOPY WITH RETROGRADE PYELOGRAM, URETEROSCOPY AND STENT PLACEMENT Right 10/07/2013   Procedure: CYSTOSCOPY WITH RETROGRADE PYELOGRAM, right URETEROSCOPY AND STENT PLACEMENT, stone extraction;  Surgeon: Danae Chen, MD;  Location: [facility];  Service: Urology;  Laterality: Right;   CYSTOSCOPY WITH STENT PLACEMENT Right 06/04/2013   Procedure: CYSTOSCOPY WITH STENT PLACEMENT;  Surgeon: Marcine Matar, MD;  Location: [facility] ORS;  Service: Urology;  Laterality: Right;   DILATATION & CURETTAGE/HYSTEROSCOPY WITH MYOSURE N/A 04/27/2015   Procedure: DILATATION & CURETTAGE/HYSTEROSCOPY WITH MYOSURE;  Surgeon: Ok Edwards, MD;  Location: [facility] ORS;  Service: Gynecology;  Laterality: N/A;   ENDARTERECTOMY FEMORAL Left 06/08/2014   Procedure: Left Leg Common Femoral and External Iliac  Endartarectomy with patch Angioplasty;  Surgeon: Larina Earthly, MD;  Location: [facility] OR;  Service: Vascular;  Laterality: Left;   ESOPHAGOGASTRODUODENOSCOPY N/A 10/14/2018   Procedure: ESOPHAGOGASTRODUODENOSCOPY (EGD);  Surgeon: Lemar Lofty., MD;  Location: [facility] ENDOSCOPY;  Service: Gastroenterology;  Laterality: N/A;   ESOPHAGOGASTRODUODENOSCOPY (EGD) WITH PROPOFOL N/A 11/06/2018   Procedure: ESOPHAGOGASTRODUODENOSCOPY (EGD) WITH PROPOFOL;  Surgeon: Meridee Score Netty Starring., MD;  Location: [facility] ENDOSCOPY;  Service: Gastroenterology;  Laterality: N/A;  RFA   ESOPHAGOGASTRODUODENOSCOPY (EGD) WITH PROPOFOL N/A 02/12/2019   Procedure: ESOPHAGOGASTRODUODENOSCOPY (EGD) WITH  PROPOFOL;  Surgeon: Meridee Score Netty Starring., MD;  Location: [facility] ENDOSCOPY;  Service: Gastroenterology;  Laterality: N/A;   ESOPHAGOGASTRODUODENOSCOPY (EGD) WITH PROPOFOL N/A 08/04/2019   Procedure: ESOPHAGOGASTRODUODENOSCOPY (EGD) WITH PROPOFOL;  Surgeon: Meridee Score Netty Starring., MD;  Location: [facility] ENDOSCOPY;  Service: Gastroenterology;  Laterality: N/A;  WITH RFA   EXTRACORPOREAL SHOCK WAVE LITHOTRIPSY Right 08-04-2013//   06-23-2013//   01-16-2011   EYE SURGERY Bilateral    cataract   FEMORAL-POPLITEAL BYPASS GRAFT Left 06/08/2014   Procedure: Left Leg Femoral -Popliteal Bypass Graft;  Surgeon: Larina Earthly, MD;  Location: [facility] OR;  Service: Vascular;  Laterality: Left;   FEMORAL-POPLITEAL BYPASS GRAFT Left 06/09/2014   Procedure: Left Femoral and Popliteal Exposure; Left Femoral to Anterior Tibial Bypass Graft using Propaten 6mm by 80cm Goretex Graft; Left Tibial Endarterectomy; Left Femoraland Popliteal Thrombectomy ;  Surgeon: Nada Libman, MD;  Location: [facility] OR;  Service: Vascular;  Laterality: Left;   FEMORAL-TIBIAL BYPASS GRAFT Right 03/09/2023   Procedure: RIGHT FEMORAL-POSTERIOR TIBIAL ARTERY BYPASS USING X 80CM PROPATEN GRAFT;  Surgeon: Nada Libman, MD;  Location: [facility] OR;  Service: Vascular;  Laterality: Right;   GI RADIOFREQUENCY ABLATION N/A 11/06/2018   Procedure: GI RADIOFREQUENCY ABLATION;  Surgeon: Meridee Score,  Netty Starring., MD;  Location: [facility] ENDOSCOPY;  Service: Gastroenterology;  Laterality: N/A;   GI RADIOFREQUENCY ABLATION N/A 02/12/2019   Procedure: GI RADIOFREQUENCY ABLATION;  Surgeon: Meridee Score Netty Starring., MD;  Location: [facility] ENDOSCOPY;  Service: Gastroenterology;  Laterality: N/A;   GI RADIOFREQUENCY ABLATION N/A 08/04/2019   Procedure: GI RADIOFREQUENCY ABLATION;  Surgeon: Meridee Score Netty Starring., MD;  Location: [facility] ENDOSCOPY;  Service: Gastroenterology;  Laterality: N/A;   HOLMIUM LASER APPLICATION Right 10/07/2013   Procedure: HOLMIUM LASER APPLICATION;  Surgeon:  Danae Chen, MD;  Location: [facility];  Service: Urology;  Laterality: Right;   KIDNEY STONE SURGERY  07/2013   1-2 stones   LAPAROSCOPIC GASTRIC BANDING  05/29/2005   LITHOTRIPSY  2-3 times   LOWER EXTREMITY ANGIOGRAM N/A 06/04/2014   Procedure: LOWER EXTREMITY ANGIOGRAM;  Surgeon: Nada Libman, MD;  Location: [facility] CATH LAB;  Service: Cardiovascular;  Laterality: N/A;   ORIF FIFTH METACARPAL FX  RIGHT HAND  04/21/2002   PATCH ANGIOPLASTY Right 03/09/2023   Procedure: VEIN PATCH ANGIOPLASTY TO RIGHT FEMORAL ARTERY;  Surgeon: Nada Libman, MD;  Location: [facility] OR;  Service: Vascular;  Laterality: Right;   POLYPECTOMY     REVISION AND RE-SITING LAP-BAND PORT  04/08/2010   W/  UPPER EGD   RIGHT KNEE PATELLECTOMY W/ REPAIR OF EXTENSOR MECHANISM  04/14/2002   TEE WITHOUT CARDIOVERSION N/A 12/24/2019   Procedure: TRANSESOPHAGEAL ECHOCARDIOGRAM (TEE);  Surgeon: Quintella Reichert, MD;  Location: [facility] ENDOSCOPY;  Service: Cardiovascular;  Laterality: N/A;   Toenail removed Left 05/21/2014   2nd toenail-  Dr. Elvin So    Social History  reports that she quit smoking about 21 years ago. Her smoking use included cigarettes. She started smoking about 49 years ago. She has a 28 pack-year smoking history. She has never been exposed to tobacco smoke. She has never used smokeless tobacco. She reports that she does not drink alcohol and does not use drugs.  Allergies  Allergen Reactions   Amlodipine Swelling   Atorvastatin Other (See Comments)    Muscle aches    Ampicillin Nausea And Vomiting and Other (See Comments)   Codeine Nausea And Vomiting    Ask patient   Lisinopril Cough        Penicillins Nausea And Vomiting    Did it involve swelling of the face/tongue/throat, SOB, or low BP? No Did it involve sudden or severe rash/hives, skin peeling, or any reaction on the inside of your mouth or nose? No Did you need to seek medical attention at a hospital or doctor's office? No When did  it last happen?      20+ years If all above answers are "NO", may proceed with cephalosporin use.    Januvia [Sitagliptin] Nausea And Vomiting    Family History  Problem Relation Age of Onset   Kidney disease Mother    Hyperlipidemia Mother    Other Mother        AAA   and    Amputation   Diabetes Father    Heart disease Father    Deep vein thrombosis Father    Hyperlipidemia Father    Diabetes Sister    Heart disease Sister    Deep vein thrombosis Sister    Hyperlipidemia Sister    Diabetes Brother    Heart disease Brother    Cancer Brother    Hyperlipidemia Brother    Diabetes Brother    Diabetes Brother    Colon polyps Brother  Colon cancer Maternal Aunt    Arthritis Other    Cancer Other        colon   Hypertension Other    Stroke Other    Esophageal cancer Neg Hx    Stomach cancer Neg Hx    Inflammatory bowel disease Neg Hx    Liver disease Neg Hx    Pancreatic cancer Neg Hx    Crohn's disease Neg Hx    Rectal cancer Neg Hx   Reviewed on admission  Prior to Admission medications   Medication Sig Start Date End Date Taking? Authorizing Provider  aspirin EC 81 MG tablet Take 1 tablet (81 mg total) by mouth daily. Swallow whole. 03/12/23   Lars Mage, PA-C  Blood Glucose Monitoring Suppl (ONETOUCH VERIO IQ SYSTEM) w/Device KIT USE TO CHECK SUGAR DAILY 10/11/18   Etta Grandchild, MD  cetirizine (ZYRTEC) 10 MG tablet Take 10 mg by mouth daily as needed for allergies.    [provider]  clopidogrel (PLAVIX) 75 MG tablet Take 1 tablet (75 mg total) by mouth daily at 6 (six) AM. 04/02/23   Emilie Rutter, PA-C  diltiazem (CARDIZEM CD) 180 MG 24 hr capsule Take 1 capsule (180 mg total) by mouth daily. 02/26/23 05/27/23  Jodelle Gross, NP  diltiazem (CARDIZEM) 30 MG tablet Take 1 tablet every 4 hours AS NEEDED for AFIB heart rate >100 as long as top BP >100. 02/28/23   Jodelle Gross, NP  dorzolamide-timolol (COSOPT) 22.3-6.8 MG/ML ophthalmic  solution Place 1 drop into the right eye 2 (two) times daily.  02/05/19   [provider]  doxycycline (ADOXA) 100 MG tablet Take 50 mg by mouth 2 (two) times daily.    [provider]  ezetimibe (ZETIA) 10 MG tablet Take 1 tablet (10 mg total) by mouth daily. 02/06/23   Etta Grandchild, MD  HUMALOG KWIKPEN 100 UNIT/ML KwikPen DIAL 14 UNITS AND INJECT UNDER THE SKIN THREE TIMES A DAY. 02/12/23   Etta Grandchild, MD  hydrALAZINE (APRESOLINE) 50 MG tablet Take 1 tablet (50 mg total) by mouth 3 (three) times daily. Patient taking differently: Take 50 mg by mouth 2 (two) times daily. 02/20/23   Etta Grandchild, MD  Insulin Glargine [facility] KWIKPEN) 100 UNIT/ML Inject 60 Units into the skin daily. Patient taking differently: Inject 50 Units into the skin at bedtime. 02/23/23   Etta Grandchild, MD  Insulin Pen Needle (PEN NEEDLES) 32G X 4 MM MISC Inject 1 pen  as directed 4 (four) times daily. Use to inject levemir or novolog as directed. 12/08/21   Etta Grandchild, MD  latanoprost (XALATAN) 0.005 % ophthalmic solution Place 1 drop into both eyes at bedtime.  06/18/17   [provider]  MAGnesium-Oxide 400 (240 Mg) MG tablet Take 1 tablet (400 mg total) by mouth daily. 02/07/23   Etta Grandchild, MD  nebivolol (BYSTOLIC) 10 MG tablet Take 1 tablet by mouth once daily 10/02/22   Jake Bathe, MD  ondansetron (ZOFRAN) 4 MG tablet Take 1 tablet (4 mg total) by mouth every 8 (eight) hours as needed for nausea or vomiting. 03/13/23 03/12/24  Lars Mage, PA-C  ondansetron (ZOFRAN) 4 MG tablet Take 1 tablet (4 mg total) by mouth daily as needed for nausea or vomiting. 03/13/23 03/12/24  Lars Mage, PA-C  ONETOUCH VERIO test strip USE TO check blood glucose TWICE DAILY 04/25/22   Etta Grandchild, MD  oxyCODONE (OXY IR/ROXICODONE) 5  MG immediate release tablet Take 1-2 tablets (5-10 mg total) by mouth every 4 (four) hours as needed for moderate pain (pain score 4-6). 03/12/23    Lars Mage, PA-C  pantoprazole (PROTONIX) 40 MG tablet Take 1 tablet (40 mg total) by mouth daily. 04/02/23   Emilie Rutter, PA-C  rosuvastatin (CRESTOR) 5 MG tablet Take 1 tablet (5 mg total) by mouth daily. 07/04/22   Jake Bathe, MD  Semaglutide, 1 MG/DOSE, (OZEMPIC, 1 MG/DOSE,) 4 MG/3ML SOPN Inject 1 mg into the skin once a week. Via [employer] patient assistance 04/06/22   Etta Grandchild, MD  SYNJARDY 5-500 MG TABS Take 1 tablet by mouth twice daily 03/16/23   Etta Grandchild, MD  XARELTO 20 MG TABS tablet Take 1 tablet by mouth once daily 12/09/22   Etta Grandchild, MD    Physical Exam: Vitals:   04/06/23 1753 04/06/23 1756 04/06/23 1813 04/06/23 1819  BP: 99/72 99/72 126/73 126/73  Pulse: (!) 133 (!) 133 (!) 131 (!) 131  Resp: (!) 22 (!) 22 (!) 22 (!) 22  Temp: 98.4 F (36.9 C) 98.4 F (36.9 C) 98.5 F (36.9 C) 98.5 F (36.9 C)  TempSrc: Oral Oral Oral Oral  SpO2: 100% 100% 100% 100%  Weight:      Height:        Physical Exam Constitutional:      General: She is not in acute distress.    Appearance: Normal appearance.  HENT:     Head: Normocephalic and atraumatic.     Mouth/Throat:     Mouth: Mucous membranes are moist.     Pharynx: Oropharynx is clear.  Eyes:     Extraocular Movements: Extraocular movements intact.     Pupils: Pupils are equal, round, and reactive to light.  Cardiovascular:     Rate and Rhythm: Tachycardia present. Rhythm irregular.     Pulses: Normal pulses.     Heart sounds: Normal heart sounds.  Pulmonary:     Effort: Pulmonary effort is normal. No respiratory distress.     Breath sounds: Normal breath sounds.  Abdominal:     General: Bowel sounds are normal. There is no distension.     Palpations: Abdomen is soft.     Tenderness: There is no abdominal tenderness.  Musculoskeletal:        General: No swelling or deformity.  Skin:    General: Skin is warm and dry.  Neurological:     General: No focal deficit present.     Mental  Status: Mental status is at baseline.     Labs on Admission: I have personally reviewed following labs and imaging studies  CBC: Recent Labs  Lab 04/06/23 1055 04/06/23 1514  WBC 11.6* 9.7  NEUTROABS 8.0* 7.1  HGB 6.4 Repeated and verified X2.* 6.3*  HCT 21.3 Repeated and verified X2.* 22.2*  MCV 79.5 83.5  PLT 348.0 365    Basic Metabolic Panel: Recent Labs  Lab 04/06/23 1055 04/06/23 1514  NA 141 141  K 3.8 4.6  CL 108 112*  CO2 22 19*  GLUCOSE 202* 239*  BUN 14 14  CREATININE 0.90 1.05*  CALCIUM 8.9 9.1    GFR: Estimated Creatinine Clearance: 52.5 mL/min (A) (by C-G formula based on SCr of 1.05 mg/dL (H)).  Liver Function Tests: Recent Labs  Lab 04/06/23 1055 04/06/23 1514  AST 8 14*  ALT 8 11  ALKPHOS 62 52  BILITOT 0.3 0.4  PROT 6.5 5.9*  ALBUMIN  3.8 3.1*    Urine analysis:    Component Value Date/Time   COLORURINE YELLOW 02/20/2023 1616   APPEARANCEUR CLEAR 02/20/2023 1616   LABSPEC 1.020 02/20/2023 1616   PHURINE 6.0 02/20/2023 1616   GLUCOSEU >=1000 (A) 02/20/2023 1616   HGBUR NEGATIVE 02/20/2023 1616   BILIRUBINUR NEGATIVE 02/20/2023 1616   BILIRUBINUR Negative 01/21/2020 1502   KETONESUR NEGATIVE 02/20/2023 1616   PROTEINUR Positive (A) 01/21/2020 1502   PROTEINUR NEGATIVE 06/14/2014 2235   UROBILINOGEN 1.0 02/20/2023 1616   NITRITE NEGATIVE 02/20/2023 1616   LEUKOCYTESUR NEGATIVE 02/20/2023 1616    Radiological Exams on Admission: No results found.  EKG: Independently reviewed.  Atrial flutter at 105 bpm.  Nonspecific T wave changes.  Assessment/Plan Active Problems:   Hyperlipidemia with target LDL less than 100   Essential hypertension, benign   History of laparoscopic adjustable gastric banding, 05/29/2005.   Anemia, iron deficiency   Atherosclerosis of native arteries of the extremities with ulceration (HCC)   Barrett's esophagus with high grade dysplasia   Gastroesophageal reflux disease with esophagitis   Atrial  fibrillation (HCC)   DM (diabetes mellitus) type II, controlled, with peripheral vascular disorder (HCC)   Spinal stenosis of lumbar region with neurogenic claudication   Severe episode of recurrent major depressive disorder, without psychotic features (HCC)   Chronic renal disease, stage 2, mildly decreased glomerular filtration rate (GFR) between 60-89 mL/min/1.73 square meter   Femoral-tibial bypass graft occlusion, right (HCC)   PAD (peripheral artery disease) (HCC)   Symptomatic anemia GI bleed Iron deficiency anemia > Patient with baseline iron deficiency anemia with baseline hemoglobin of 8-9. > Now with several days of dark stools and increasing fatigue/DOE for about a week. > Found to have hemoglobin at PCP visit of 6.4 and confirmed to be 6.3 here. > Ferritin at PCP 10 and iron 14. > 1 unit ordered for transfusion in ED as well as IV PPI. > GI consulted and will see the patient, recommended NPO. - Monitor on progressive unit overnight - Appreciate GI recommendations and assistance - Continue with IV PPI - Trend hemoglobin, continue to transfuse for hemoglobin less than 7 - N.p.o. - Hold aspirin, Plavix, Xarelto - Supportive care  Atrial fibrillation/flutter with RVR > Patient initially appeared to have some tachycardia but  rhythm has not been going in and out of atrial fibrillation/flutter with rates in the 130s more consistently. > EDP is given one-time dose of IV diltiazem as this which she takes at home and is prescribed additional p.o. doses for rapid heart rate if needed. > If patient fails to respond may proceed with diltiazem infusion versus amiodarone infusion depending on blood pressure response.   - Monitoring on progressive as above - Monitor response to initial IV diltiazem - Further intervention pending response of this - Holding anticoagulation as above  PAD Hyperlipidemia > Recent femoropopliteal bypass - Holding aspirin, Plavix, Xarelto - Continue home  rosuvastatin, Zetia  Hypertension - Holding hydralazine - Continue with diltiazem and nebivolol when able to have p.o. - Monitor response to IV diltiazem  GERD Barrett's esophagus - On IV PPI as above  Diabetes > On long-acting 50 units at night, 14 units with meals - 25 units nightly, SSI  CKD 2 > Creatinine stable in the ED. - Trend renal function and electrolytes  DVT prophylaxis: SCD's Code Status:   Full Family Communication:  None on admission  Disposition Plan:   Patient is from:  Home  Anticipated DC to:  Home  Anticipated DC date:  1 to 3 days  Anticipated DC barriers: None  Consults called:  Gastroenterology Admission status:  Observation, progressive  Severity of Illness: The appropriate patient status for this patient is OBSERVATION. Observation status is judged to be reasonable and necessary in order to provide the required intensity of service to ensure the patient's safety. The patient's presenting symptoms, physical exam findings, and initial radiographic and laboratory data in the context of their medical condition is felt to place them at decreased risk for further clinical deterioration. Furthermore, it is anticipated that the patient will be medically stable for discharge from the hospital within 2 midnights of admission.    Synetta Fail MD [facility]  How to contact the [facility] Attending or Consulting provider 7A - 7P or covering provider during after hours 7P -7A, for this patient?   Check the care team in [facility] and look for a) attending/consulting [facility] provider listed and b) the [facility] team listed Log into [email or web address] and use [place]'s universal password to access. If you do not have the password, please contact the hospital operator. Locate the [facility] provider you are looking for under [facility] and page to a number that you can be directly reached. If you still have difficulty reaching the provider, please page the [facility] (Director on  Call) for the Hospitalists listed on amion for assistance.  04/06/2023, 6:45 PM

## 2023-04-06 NOTE — Telephone Encounter (Signed)
Pt called stating that her HGB is low and she needs a transfusion.  Reviewed pt's chart, returned call for clarification, two identifiers used. Pt stated that she called this office yesterday, spoke with a RN who advised that she been seen by her PCP asap d/t symptoms. Pt had labs and her HGB is <7 and was advised by her PCP to go to the hospital to get a blood transfusion.   She called to make sure that is what Dr. Myra Gianotti agrees with as well. Instructed her to go to [facility] NOW because she is having internal bleeding and values below 7 are considered potentially life threatening and she needs to be seen. Instructed her that vascular will be consulted to review her anticoagulation and adjust accordingly. The pt is concerned about her bypass, but informed her that she can't continue her anticoagulation regimen, if she's bleeding. Reassured her that her meds would be reviewed and adjusted. Pt on the way to [facility] now. Confirmed understanding.

## 2023-04-06 NOTE — Telephone Encounter (Signed)
Please call patient.  Let her know her hemoglobin is 6.4 and it was 9.0 on 11/12.  She is actively bleeding and there has been a significant decrease in her blood counts which is why she is feeling as tired as she is.  This is related to being on the 3 blood thinners.  Because she is actively bleeding she does need to go to the emergency room.  They will contact GI, vascular and cardiology.

## 2023-04-06 NOTE — ED Notes (Signed)
Dr. Beckey Downing at bedside and aware of pt Hg 6.3

## 2023-04-06 NOTE — ED Provider Triage Note (Signed)
Emergency Medicine Provider Triage Evaluation Note  Harue Sole , a 67 y.o. female  was evaluated in triage.  Pt reports she was sent here for a low hemoglobin of 6. Reports fatigue and dark stools over the past couple of days. Reports 11/8 femoral popliteal bypass and being on 3 blood thinners  Review of Systems  Positive: As above Negative: As above  Physical Exam  BP (!) 110/59 (BP Location: Right Arm)   Pulse (!) 107   Temp (!) 97.5 F (36.4 C)   Resp (!) 22   Ht 5\' 4"  (1.626 m)   Wt 75.8 kg   LMP  (LMP Unknown)   SpO2 100%   BMI 28.67 kg/m  Gen:   Awake, no distress   Resp:  Normal effort  MSK:   Moves extremities without difficulty  Other:  Skin Jaundiced appearing  Medical Decision Making  Medically screening exam initiated at 2:57 PM.  Appropriate orders placed.  Elizbeth Bragan was informed that the remainder of the evaluation will be completed by another provider, this initial triage assessment does not replace that evaluation, and the importance of remaining in the ED until their evaluation is complete.     Arabella Merles, PA-C 04/06/23 1457

## 2023-04-06 NOTE — ED Provider Notes (Signed)
[facility] EMERGENCY DEPARTMENT AT [facility] Provider Note   CSN: [id number] Arrival date & time: 04/06/23  1437     History  Chief Complaint  Patient presents with   Post-op Problem    Theresa Harrington is a 67 y.o. female.  67 year old female with past medical history of peripheral vascular disease and atrial fibrillation on Eliquis, aspirin, and Plavix presenting to the emergency department today with concern for anemia.  The patient does have iron deficiency anemia at baseline.  She had a recent femoropopliteal bypass on 8 November.  She reports that she is doing well from the standpoint.  She has been having some fatigue and mild dyspnea on exertion that has been going on now for the past week or so.  She followed up with her doctor today and had some blood work performed.  She does have anemia and was told to come to the ER for further evaluation.  The patient reports that she has been having some dark stools over the past few days.  She denies any frankly bloody stools.  Denies any hematemesis.  She denies any significant abdominal pain.  She came to the ER today for further evaluation regarding this.        Home Medications Prior to Admission medications   Medication Sig Start Date End Date Taking? Authorizing Provider  aspirin EC 81 MG tablet Take 1 tablet (81 mg total) by mouth daily. Swallow whole. 03/12/23   Lars Mage, PA-C  Blood Glucose Monitoring Suppl (ONETOUCH VERIO IQ SYSTEM) w/Device KIT USE TO CHECK SUGAR DAILY 10/11/18   Etta Grandchild, MD  cetirizine (ZYRTEC) 10 MG tablet Take 10 mg by mouth daily as needed for allergies.    [provider]  clopidogrel (PLAVIX) 75 MG tablet Take 1 tablet (75 mg total) by mouth daily at 6 (six) AM. 04/02/23   Emilie Rutter, PA-C  diltiazem (CARDIZEM CD) 180 MG 24 hr capsule Take 1 capsule (180 mg total) by mouth daily. 02/26/23 05/27/23  Jodelle Gross, NP  diltiazem (CARDIZEM) 30 MG tablet Take 1 tablet  every 4 hours AS NEEDED for AFIB heart rate >100 as long as top BP >100. 02/28/23   Jodelle Gross, NP  dorzolamide-timolol (COSOPT) 22.3-6.8 MG/ML ophthalmic solution Place 1 drop into the right eye 2 (two) times daily.  02/05/19   [provider]  ezetimibe (ZETIA) 10 MG tablet Take 1 tablet (10 mg total) by mouth daily. 02/06/23   Etta Grandchild, MD  HUMALOG KWIKPEN 100 UNIT/ML KwikPen DIAL 14 UNITS AND INJECT UNDER THE SKIN THREE TIMES A DAY. 02/12/23   Etta Grandchild, MD  hydrALAZINE (APRESOLINE) 50 MG tablet Take 1 tablet (50 mg total) by mouth 3 (three) times daily. Patient taking differently: Take 50 mg by mouth 2 (two) times daily. 02/20/23   Etta Grandchild, MD  Insulin Glargine [facility] KWIKPEN) 100 UNIT/ML Inject 60 Units into the skin daily. Patient taking differently: Inject 50 Units into the skin at bedtime. 02/23/23   Etta Grandchild, MD  Insulin Pen Needle (PEN NEEDLES) 32G X 4 MM MISC Inject 1 pen  as directed 4 (four) times daily. Use to inject levemir or novolog as directed. 12/08/21   Etta Grandchild, MD  latanoprost (XALATAN) 0.005 % ophthalmic solution Place 1 drop into both eyes at bedtime.  06/18/17   [provider]  MAGnesium-Oxide 400 (240 Mg) MG tablet Take 1 tablet (400 mg total) by mouth daily. 02/07/23  Etta Grandchild, MD  nebivolol (BYSTOLIC) 10 MG tablet Take 1 tablet by mouth once daily 10/02/22   Jake Bathe, MD  ondansetron (ZOFRAN) 4 MG tablet Take 1 tablet (4 mg total) by mouth every 8 (eight) hours as needed for nausea or vomiting. 03/13/23 03/12/24  Lars Mage, PA-C  ondansetron (ZOFRAN) 4 MG tablet Take 1 tablet (4 mg total) by mouth daily as needed for nausea or vomiting. 03/13/23 03/12/24  Lars Mage, PA-C  ONETOUCH VERIO test strip USE TO check blood glucose TWICE DAILY 04/25/22   Etta Grandchild, MD  oxyCODONE (OXY IR/ROXICODONE) 5 MG immediate release tablet Take 1-2 tablets (5-10 mg total) by mouth every 4 (four) hours  as needed for moderate pain (pain score 4-6). 03/12/23   Lars Mage, PA-C  pantoprazole (PROTONIX) 40 MG tablet Take 1 tablet (40 mg total) by mouth daily. 04/02/23   Emilie Rutter, PA-C  rosuvastatin (CRESTOR) 5 MG tablet Take 1 tablet (5 mg total) by mouth daily. 07/04/22   Jake Bathe, MD  Semaglutide, 1 MG/DOSE, (OZEMPIC, 1 MG/DOSE,) 4 MG/3ML SOPN Inject 1 mg into the skin once a week. Via [employer] patient assistance 04/06/22   Etta Grandchild, MD  SYNJARDY 5-500 MG TABS Take 1 tablet by mouth twice daily 03/16/23   Etta Grandchild, MD  XARELTO 20 MG TABS tablet Take 1 tablet by mouth once daily 12/09/22   Etta Grandchild, MD      Allergies    Amlodipine, Atorvastatin, Ampicillin, Codeine, Lisinopril, Penicillins, and Januvia [sitagliptin]    Review of Systems   Review of Systems  Constitutional:  Positive for fatigue.  Gastrointestinal:        Dark stools  All other systems reviewed and are negative.   Physical Exam Updated Vital Signs BP 126/73 (BP Location: Left Arm)   Pulse (!) 131   Temp 98.5 F (36.9 C) (Oral)   Resp (!) 22   Ht 5\' 4"  (1.626 m)   Wt 75.8 kg   LMP  (LMP Unknown)   SpO2 100%   BMI 28.67 kg/m  Physical Exam Vitals and nursing note reviewed.   Gen: NAD, pale appearing Eyes: PERRL, EOMI HEENT: no oropharyngeal swelling Neck: trachea midline Resp: clear to auscultation bilaterally Card: Tachycardic, no murmurs, rubs, or gallops Abd: nontender, nondistended Extremities: no calf tenderness, no edema Vascular: 2+ radial pulses bilaterally, 2+ DP pulses bilaterally Skin: no rashes Psyc: acting appropriately   ED Results / Procedures / Treatments   Labs (all labs ordered are listed, but only abnormal results are displayed) Labs Reviewed  CBC WITH DIFFERENTIAL/PLATELET - Abnormal; Notable for the following components:      Result Value   RBC 2.66 (*)    Hemoglobin 6.3 (*)    HCT 22.2 (*)    MCH 23.7 (*)    MCHC 28.4 (*)    RDW 17.4 (*)     nRBC 0.5 (*)    All other components within normal limits  APTT - Abnormal; Notable for the following components:   aPTT 37 (*)    All other components within normal limits  COMPREHENSIVE METABOLIC PANEL - Abnormal; Notable for the following components:   Chloride 112 (*)    CO2 19 (*)    Glucose, Bld 239 (*)    Creatinine, Ser 1.05 (*)    Total Protein 5.9 (*)    Albumin 3.1 (*)    AST 14 (*)    GFR, Estimated  59 (*)    All other components within normal limits  HIV ANTIBODY (ROUTINE TESTING W REFLEX)  CBC  COMPREHENSIVE METABOLIC PANEL  POC OCCULT BLOOD, ED  POC OCCULT BLOOD, ED  TYPE AND SCREEN  PREPARE RBC (CROSSMATCH)    EKG EKG Interpretation Date/Time:  Friday April 06 2023 14:45:10 EST Ventricular Rate:  111 PR Interval:    QRS Duration:  80 QT Interval:  352 QTC Calculation: 478 R Axis:   50  Text Interpretation: Atrial fibrillation with rapid ventricular response Nonspecific T wave abnormality Abnormal ECG When compared with ECG of 26-Feb-2023 09:02, PREVIOUS ECG IS PRESENT Confirmed by Estelle June [phone]) on 04/06/2023 3:29:27 PM  Radiology No results found.  Procedures Procedures    Medications Ordered in ED Medications  0.9 %  sodium chloride infusion (Manually program via Guardrails IV Fluids) ( Intravenous Not Given 04/06/23 1802)  sodium chloride 0.9 % bolus 1,000 mL (has no administration in time range)  diltiazem (CARDIZEM) injection 10 mg (has no administration in time range)  ezetimibe (ZETIA) tablet 10 mg (has no administration in time range)  rosuvastatin (CRESTOR) tablet 5 mg (has no administration in time range)  dorzolamide-timolol (COSOPT) 2-0.5 % ophthalmic solution 1 drop (has no administration in time range)  latanoprost (XALATAN) 0.005 % ophthalmic solution 1 drop (has no administration in time range)  sodium chloride flush (NS) 0.9 % injection 3 mL (has no administration in time range)  acetaminophen (TYLENOL) tablet 650 mg  (has no administration in time range)    Or  acetaminophen (TYLENOL) suppository 650 mg (has no administration in time range)  polyethylene glycol (MIRALAX / GLYCOLAX) packet 17 g (has no administration in time range)  diltiazem (CARDIZEM CD) 24 hr capsule 180 mg (has no administration in time range)  nebivolol (BYSTOLIC) tablet 10 mg (has no administration in time range)  pantoprazole (PROTONIX) injection 80 mg (has no administration in time range)  pantoprazole (PROTONIX) injection 80 mg (80 mg Intravenous Given 04/06/23 1748)    ED Course/ Medical Decision Making/ A&P                                 Medical Decision Making 67 year old female with past medical history of peripheral vascular disease and atrial fibrillation presenting to the emergency department today with fatigue and symptomatic anemia.  I will repeat her labs here to evaluate for lab error.  Will obtain type and screen as she will likely require transfusion.  The patient is tachycardic here.  This does appear to be sinus on monitor.  Will obtain EKG to eval for arrhythmia.  She will likely require admission.  The patient's EKG does show rapid atrial fibrillation.  Her blood pressures are stable here.  Her hemoglobin did come back at 6.3.  I will give the patient a blood transfusion.  Will hold off on rate control at this time as she clearly has reason for the tachycardia to avoid hypotension.  The patient's heart rate remained in the 130s despite the fluids and with the blood going.  I did discuss her case with the hospitalist service.  They asked that I give the patient Cardizem since her blood pressures are stable and she does take this at home.  She is given 10 mg of Cardizem here.  I discussed the patient's case with vascular surgery as well as gastroenterology.  A vascular surgery perspective it is okay to hold her anticoagulation  if she is acutely bleeding.  GI recommends n.p.o. and agrees with Protonix.  Will see the  patient during admission for likely endoscopy.  Hemoccult pending at the time of admission  CRITICAL CARE Performed by: Durwin Glaze   Total critical care time: 45 minutes  Critical care time was exclusive of separately billable procedures and treating other patients.  Critical care was necessary to treat or prevent imminent or life-threatening deterioration.  Critical care was time spent personally by me on the following activities: development of treatment plan with patient and/or surrogate as well as nursing, discussions with consultants, evaluation of patient's response to treatment, examination of patient, obtaining history from patient or surrogate, ordering and performing treatments and interventions, ordering and review of laboratory studies, ordering and review of radiographic studies, pulse oximetry and re-evaluation of patient's condition.   Amount and/or Complexity of Data Reviewed Labs: ordered.  Risk Prescription drug management. Decision regarding hospitalization.           Final Clinical Impression(s) / ED Diagnoses Final diagnoses:  Symptomatic anemia  Melena  Atrial fibrillation with RVR (HCC)    Rx / DC Orders ED Discharge Orders     None         Durwin Glaze, MD 04/06/23 [phone]

## 2023-04-06 NOTE — Telephone Encounter (Signed)
Patient advised and will go to ED as recommended.

## 2023-04-07 DIAGNOSIS — I129 Hypertensive chronic kidney disease with stage 1 through stage 4 chronic kidney disease, or unspecified chronic kidney disease: Secondary | ICD-10-CM | POA: Diagnosis not present

## 2023-04-07 DIAGNOSIS — Z7982 Long term (current) use of aspirin: Secondary | ICD-10-CM | POA: Diagnosis not present

## 2023-04-07 DIAGNOSIS — F332 Major depressive disorder, recurrent severe without psychotic features: Secondary | ICD-10-CM | POA: Diagnosis not present

## 2023-04-07 DIAGNOSIS — K5521 Angiodysplasia of colon with hemorrhage: Secondary | ICD-10-CM | POA: Diagnosis not present

## 2023-04-07 DIAGNOSIS — K5731 Diverticulosis of large intestine without perforation or abscess with bleeding: Secondary | ICD-10-CM | POA: Diagnosis not present

## 2023-04-07 DIAGNOSIS — K922 Gastrointestinal hemorrhage, unspecified: Secondary | ICD-10-CM | POA: Diagnosis not present

## 2023-04-07 DIAGNOSIS — K5730 Diverticulosis of large intestine without perforation or abscess without bleeding: Secondary | ICD-10-CM | POA: Diagnosis not present

## 2023-04-07 DIAGNOSIS — Z7902 Long term (current) use of antithrombotics/antiplatelets: Secondary | ICD-10-CM | POA: Diagnosis not present

## 2023-04-07 DIAGNOSIS — D62 Acute posthemorrhagic anemia: Secondary | ICD-10-CM | POA: Diagnosis not present

## 2023-04-07 DIAGNOSIS — D175 Benign lipomatous neoplasm of intra-abdominal organs: Secondary | ICD-10-CM | POA: Diagnosis not present

## 2023-04-07 DIAGNOSIS — Z8249 Family history of ischemic heart disease and other diseases of the circulatory system: Secondary | ICD-10-CM | POA: Diagnosis not present

## 2023-04-07 DIAGNOSIS — E785 Hyperlipidemia, unspecified: Secondary | ICD-10-CM | POA: Diagnosis not present

## 2023-04-07 DIAGNOSIS — Z841 Family history of disorders of kidney and ureter: Secondary | ICD-10-CM | POA: Diagnosis not present

## 2023-04-07 DIAGNOSIS — Z794 Long term (current) use of insulin: Secondary | ICD-10-CM | POA: Diagnosis not present

## 2023-04-07 DIAGNOSIS — K5900 Constipation, unspecified: Secondary | ICD-10-CM | POA: Diagnosis not present

## 2023-04-07 DIAGNOSIS — E1122 Type 2 diabetes mellitus with diabetic chronic kidney disease: Secondary | ICD-10-CM | POA: Diagnosis not present

## 2023-04-07 DIAGNOSIS — Z823 Family history of stroke: Secondary | ICD-10-CM | POA: Diagnosis not present

## 2023-04-07 DIAGNOSIS — I480 Paroxysmal atrial fibrillation: Secondary | ICD-10-CM | POA: Diagnosis not present

## 2023-04-07 DIAGNOSIS — E039 Hypothyroidism, unspecified: Secondary | ICD-10-CM | POA: Diagnosis not present

## 2023-04-07 DIAGNOSIS — Z1152 Encounter for screening for COVID-19: Secondary | ICD-10-CM | POA: Diagnosis not present

## 2023-04-07 DIAGNOSIS — D649 Anemia, unspecified: Secondary | ICD-10-CM | POA: Diagnosis not present

## 2023-04-07 DIAGNOSIS — D6832 Hemorrhagic disorder due to extrinsic circulating anticoagulants: Secondary | ICD-10-CM | POA: Diagnosis not present

## 2023-04-07 DIAGNOSIS — I4891 Unspecified atrial fibrillation: Secondary | ICD-10-CM | POA: Diagnosis not present

## 2023-04-07 DIAGNOSIS — D509 Iron deficiency anemia, unspecified: Secondary | ICD-10-CM | POA: Diagnosis not present

## 2023-04-07 DIAGNOSIS — K921 Melena: Secondary | ICD-10-CM | POA: Diagnosis not present

## 2023-04-07 DIAGNOSIS — N182 Chronic kidney disease, stage 2 (mild): Secondary | ICD-10-CM | POA: Diagnosis not present

## 2023-04-07 DIAGNOSIS — E1151 Type 2 diabetes mellitus with diabetic peripheral angiopathy without gangrene: Secondary | ICD-10-CM | POA: Diagnosis not present

## 2023-04-07 DIAGNOSIS — M48062 Spinal stenosis, lumbar region with neurogenic claudication: Secondary | ICD-10-CM | POA: Diagnosis not present

## 2023-04-07 DIAGNOSIS — I4892 Unspecified atrial flutter: Secondary | ICD-10-CM | POA: Diagnosis not present

## 2023-04-07 DIAGNOSIS — Z7901 Long term (current) use of anticoagulants: Secondary | ICD-10-CM | POA: Diagnosis not present

## 2023-04-07 DIAGNOSIS — Z87891 Personal history of nicotine dependence: Secondary | ICD-10-CM | POA: Diagnosis not present

## 2023-04-07 DIAGNOSIS — K2100 Gastro-esophageal reflux disease with esophagitis, without bleeding: Secondary | ICD-10-CM | POA: Diagnosis not present

## 2023-04-07 LAB — GLUCOSE, CAPILLARY
Glucose-Capillary: 116 mg/dL — ABNORMAL HIGH (ref 70–99)
Glucose-Capillary: 115 mg/dL — ABNORMAL HIGH (ref 70–99)
Glucose-Capillary: 186 mg/dL — ABNORMAL HIGH (ref 70–99)
Glucose-Capillary: 132 mg/dL — ABNORMAL HIGH (ref 70–99)
Glucose-Capillary: 120 mg/dL — ABNORMAL HIGH (ref 70–99)

## 2023-04-07 LAB — COMPREHENSIVE METABOLIC PANEL
ALT: 11 U/L (ref 0–44)
AST: 14 U/L — ABNORMAL LOW (ref 15–41)
Albumin: 3 g/dL — ABNORMAL LOW (ref 3.5–5.0)
Alkaline Phosphatase: 50 U/L (ref 38–126)
Anion gap: 8 (ref 5–15)
BUN: 11 mg/dL (ref 8–23)
CO2: 21 mmol/L — ABNORMAL LOW (ref 22–32)
Calcium: 8.7 mg/dL — ABNORMAL LOW (ref 8.9–10.3)
Chloride: 110 mmol/L (ref 98–111)
Creatinine, Ser: 0.9 mg/dL (ref 0.44–1.00)
GFR, Estimated: >60 mL/min (ref >60)
Glucose, Bld: 114 mg/dL — ABNORMAL HIGH (ref 70–99)
Potassium: 4.3 mmol/L (ref 3.5–5.1)
Sodium: 139 mmol/L (ref 135–145)
Total Bilirubin: 0.8 mg/dL (ref <1.2)
Total Protein: 5.8 g/dL — ABNORMAL LOW (ref 6.5–8.1)

## 2023-04-07 LAB — BPAM RBC
Blood Product Expiration Date: 202412282359
ISSUE DATE / TIME: 202412061729
Unit Type and Rh: 6200

## 2023-04-07 LAB — TSH: TSH: 0.634 u[IU]/mL (ref 0.350–4.500)

## 2023-04-07 LAB — CBC
HCT: 25.6 % — ABNORMAL LOW (ref 36.0–46.0)
Hemoglobin: 8 g/dL — ABNORMAL LOW (ref 12.0–15.0)
MCH: 25.4 pg — ABNORMAL LOW (ref 26.0–34.0)
MCHC: 31.3 g/dL (ref 30.0–36.0)
MCV: 81.3 fL (ref 80.0–100.0)
Platelets: 325 10*3/uL (ref 150–400)
RBC: 3.15 MIL/uL — ABNORMAL LOW (ref 3.87–5.11)
RDW: 16.2 % — ABNORMAL HIGH (ref 11.5–15.5)
WBC: 9 10*3/uL (ref 4.0–10.5)
nRBC: 0.8 % — ABNORMAL HIGH (ref 0.0–0.2)

## 2023-04-07 LAB — TYPE AND SCREEN
ABO/RH(D): A POS
Antibody Screen: NEGATIVE
Unit division: 0

## 2023-04-07 LAB — BRAIN NATRIURETIC PEPTIDE: B Natriuretic Peptide: 510.7 pg/mL — ABNORMAL HIGH (ref 0.0–100.0)

## 2023-04-07 LAB — T4, FREE: Free T4: 0.93 ng/dL (ref 0.61–1.12)

## 2023-04-07 LAB — MAGNESIUM: Magnesium: 1.8 mg/dL (ref 1.7–2.4)

## 2023-04-07 LAB — HIV ANTIBODY (ROUTINE TESTING W REFLEX): HIV Screen 4th Generation wRfx: NONREACTIVE

## 2023-04-07 MED ORDER — DIGOXIN 0.25 MG/ML IJ SOLN
0.2500 mg | Freq: Four times a day (QID) | INTRAMUSCULAR | Status: AC
  Administered 2023-04-07: 0.25 mg via INTRAVENOUS
  Filled 2023-04-07 (×2): qty 1

## 2023-04-07 MED ORDER — DILTIAZEM HCL-DEXTROSE 125-5 MG/125ML-% IV SOLN (PREMIX)
5.0000 mg/h | INTRAVENOUS | Status: DC
Start: 2023-04-07 — End: 2023-04-08
  Administered 2023-04-07: 5 mg/h via INTRAVENOUS
  Filled 2023-04-07: qty 125

## 2023-04-07 MED ORDER — PANTOPRAZOLE SODIUM 40 MG IV SOLR
40.0000 mg | Freq: Two times a day (BID) | INTRAVENOUS | Status: DC
  Administered 2023-04-07 – 2023-04-10 (×8): 40 mg via INTRAVENOUS
  Filled 2023-04-07 (×7): qty 10

## 2023-04-07 MED ORDER — DILTIAZEM HCL 25 MG/5ML IV SOLN
10.0000 mg | Freq: Four times a day (QID) | INTRAVENOUS | Status: DC | PRN
  Administered 2023-04-07 – 2023-04-08 (×3): 10 mg via INTRAVENOUS
  Filled 2023-04-07 (×3): qty 5

## 2023-04-07 NOTE — Consult Note (Addendum)
Referring Provider: Dr. Susa Raring Primary Care Physician:  Etta Grandchild, MD Primary Gastroenterologist:  Dr. Amada Jupiter   Reason for Consultation: Symptomatic anemia  HPI: Theresa Harrington is a 67 y.o. female with a past medical history significant for anxiety, pression, hypertension, hyperlipidemia, atrial fibrillation, peripheral arterial disease s/p right femoral-popliteal bypass 03/09/2023, diabetes mellitus type 2, CKD stage II, spinal stenosis, diverticulosis, colon polyps, GERD, Barrett's esophagus s/p past ablation.  Past lap band surgery.  Patient is status post right femoral-popliteal bypass 03/09/2023 on ASA, Plavix and Eliquis.  She developed fatigued with dyspnea on exertion over the past week and underwent laboratory studies 04/06/2023 by her PCP which showed a hemoglobin level of 6.4 down 9 two weeks ago and she was sent to the ED for further evaluation.  Labs in the ED showed a WBC count of 11.6.  Hemoglobin 6.4.  Hematocrit 21.3.  Platelet 348.  BUN 14.  Creatinine 0.90.  Normal LFTs.  Iron 14.  Saturation ratio is 3.5.  TIBC 394.8.  TSH 0.56.  FOBT negative.  Dark stool noted per EDP.  HIV pending.  Transfuse 1 unit of PRBCs -> post transfusion Hg 8.0. Vascular surgery was consulted okay to hold anticoagulation and antiplatelet therapy.  A GI consult was requested for further evaluation regarding IDA, at risk for GI bleeding.  She endorsed feeling quite well status post right femoral-popliteal bypass 03/09/2023.  Approximately 1 week ago she developed significant fatigue which progressively worsened.  She was initially constipated postoperatively and took magnesium citrate which resulted in passing a bowel movement she then started taking Dulcolax 2 tablets once daily alternating with Colace 2 capsules every other day.  She started passing intermittent black solid to loose stools over the past week, last black stool was Thursday 12/5.  No BM since then.  No bright red rectal bleeding.   No abdominal pain.  No heartburn or dysphagia.  She last took Plavix, Xarelto and ASA yesterday morning at 8 AM.  No other NSAID use.  No Pepto-Bismol or oral iron use.  Non-smoker.  No alcohol use.  On Ozempic, last dose taken Sunday 04/01/2023.  She has a history of GERD and Barrett's esophagus with high-grade dysplasia status post RFA July 2020, October 2020 and April 2021.  She subsequently underwent numerous surveillance EGDs, her most recent EGD 08/09/2022 showed a normal esophagus without evidence of Barrett's esophagus and extrinsic compression of the stomach secondary to Lap-Band found in the cardia.  She underwent a colonoscopy on the same date which showed 3 tubular adenomatous polyps removed from the colon and diverticulosis in the left colon.  Maternal aunt with history of colon cancer.  She is currently in A-fib with RVR hemodynamically stable.  She denies having chest pain or shortness of breath.  I spoke to the patient's RN, Cardizem IV/infusion ordered.  GI PROCEDURES:  EGD 08/09/2022: - There is no endoscopic evidence of Barrett's esophagus, esophagitis, mucosal abnormalities or stricture in the entire esophagus. - Extrinsic compression on the stomach from a LapBand was found in the cardia.  -The exam of the stomach was otherwise normal.  - The examined duodenum was normal. - Surveillance EGD in 3 years  Colonoscopy 08/09/2022: - Decreased sphincter tone found on digital rectal exam.  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  - Three 3 to 6 mm polyps in the transverse colon, at the hepatic flexure and in the proximal ascending colon, removed with a cold snare. Resected and retrieved.  - Diverticulosis in  the left colon.  - The examination was otherwise normal on direct and retroflexion views. -Recall colonoscopy 3 years - TUBULAR ADENOMA(S). - NO HIGH GRADE DYSPLASIA OR MALIGNANCY  EGD 08/04/2019: - No gross lesions in esophagus.  - Previously described esophagitis is still noted,  though partially improved. Under NBI could be underlying Barrett's vs Reparative changes (last biopsy of this area was in [place] 2020). The area was discolored, texture changed mucosa in the esophagus. Biopsied. Then went ahead and treated with radiofrequency ablation.  - Esophageal mucosal changes consistent with short-segment Barrett's esophagus  - this is significantly improved from prior with only a short segment currently present. Treated with radiofrequency ablation.  - 4 cm hiatal hernia.  - Erythematous mucosa in the antrum. No other gross lesions in the stomach.  - No gross lesions in the duodenal bulb, in the first portion of the duodenum and in the second portion of the duodenum  EGD 02/12/2019:  EGD 11/06/2018:  EGD 10/14/2018:  EGD 07/30/2018:  EGD 06/26/2018: - LA Grade D reflux esophagitis. Biopsied. - 4 cm hiatal hernia.  - Normal stomach.  - Normal examined duodenum.  Colonoscopy 2/26//2020: - Decreased sphincter tone found on digital rectal exam.  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  - Medium-sized lipoma in the proximal ascending colon.  - Two diminutive polyps in the transverse colon, removed with a cold snare. Resected and retrieved.  - One 12 mm polyp at the splenic flexure, removed with a hot snare. Resected and retrieved. Clip (MR conditional) was placed.  - One diminutive polyp in the descending colon, removed with a cold snare. Resected and retrieved. - Diverticulosis in the left colon.  1. Surgical [P], transverse, polyp (2) - TUBULAR ADENOMA(S). - HIGH GRADE DYSPLASIA IS NOT IDENTIFIED. 2. Surgical [P], splenic flexure, polyp - TUBULAR ADENOMA(S). - HIGH GRADE DYSPLASIA IS NOT IDENTIFIED. 3. Surgical [P], descending, polyp - TUBULAR ADENOMA(S). - HIGH GRADE DYSPLASIA IS NOT IDENTIFIED. 4. Surgical [P], GE junction - AT LEAST HIGH GRADE DYSPLASIA ARISING IN CARDIAC MUCOSA WITH INTESTINAL METAPLASIA. SEE NOTE  ECHO 10/07/2020: 1. Left ventricular  ejection fraction, by estimation, is 65 to 70%. The left ventricle has normal function. The left ventricle has no regional wall motion abnormalities. There is mild left ventricular hypertrophy. Left ventricular diastolic parameters are consistent with Grade II diastolic dysfunction (pseudonormalization). Elevated left atrial pressure. 2. Right ventricular systolic function is normal. The right ventricular size is normal. Tricuspid regurgitation signal is inadequate for assessing PA pressure. 3. Left atrial size was severely dilated. 4. Right atrial size was mildly dilated. 5. The mitral valve is abnormal. Severe mitral annular calcification. No evidence of mitral valve regurgitation. Mild to moderate mitral stenosis. MG , MVA 1.4 cm^2 by continuity equation 6. The aortic valve is tricuspid. Aortic valve regurgitation is not visualized. Mild to moderate aortic valve sclerosis/calcification is present, without any evidence of aortic stenosis. 7. The inferior vena cava is normal in size with greater than 50% respiratory variability, suggesting right atrial pressure of 3 mmHg.  Past Medical History:  Diagnosis Date   Allergy    Anxiety    Pt. denies   Arthritis    right index figer   Asymptomatic cholelithiasis    Atherosclerosis of aorta (HCC)    Atrial flutter, paroxysmal (HCC) 02/20/2023   Cataract    Clotting disorder (HCC)    Gallstones 06/2013   GERD (gastroesophageal reflux disease)    Glaucoma    "A LITTLE BIT"  History of kidney stones    lithotrispy   Hyperlipidemia    Hypertension    Hypothyroidism    no meds   Iron deficiency anemia    PAD (peripheral artery disease) (HCC)    PAF (paroxysmal atrial fibrillation) (HCC)    Peripheral arterial occlusive disease (HCC)    lower extremities   Pneumonia    PONV (postoperative nausea and vomiting)    Right ureteral stone    Type 2 diabetes mellitus (HCC)    Type   Vitamin B 12 deficiency    Wears glasses     Past Surgical  History:  Procedure Laterality Date   ABDOMINAL AORTOGRAM W/LOWER EXTREMITY N/A 03/09/2020   Procedure: ABDOMINAL AORTOGRAM W/LOWER EXTREMITY;  Surgeon: Nada Libman, MD;  Location: [facility];  Service: Cardiovascular;  Laterality: N/A;   ABDOMINAL AORTOGRAM W/LOWER EXTREMITY N/A 03/06/2023   Procedure: ABDOMINAL AORTOGRAM W/LOWER EXTREMITY;  Surgeon: Nada Libman, MD;  Location: [facility];  Service: Cardiovascular;  Laterality: N/A;   AMPUTATION Left 07/16/2014   Procedure: LEFT SECOND TOE AMPUTATION;  Surgeon: Nada Libman, MD;  Location: [facility] OR;  Service: Vascular;  Laterality: Left;  With Nerve block   ATRIAL FIBRILLATION ABLATION N/A 08/02/2021   Procedure: ATRIAL FIBRILLATION ABLATION;  Surgeon: Regan Lemming, MD;  Location: [facility];  Service: Cardiovascular;  Laterality: N/A;   AUGMENTATION MAMMAPLASTY     BELPHAROPTOSIS REPAIR     eyelid lift   BIOPSY  10/14/2018   Procedure: BIOPSY;  Surgeon: Meridee Score Netty Starring., MD;  Location: [facility] ENDOSCOPY;  Service: Gastroenterology;;   BIOPSY  08/04/2019   Procedure: BIOPSY;  Surgeon: Lemar Lofty., MD;  Location: [facility] ENDOSCOPY;  Service: Gastroenterology;;   CARDIOVERSION N/A 09/19/2019   Procedure: CARDIOVERSION;  Surgeon: Jodelle Red, MD;  Location: [facility] ENDOSCOPY;  Service: Cardiovascular;  Laterality: N/A;   COLONOSCOPY     COMBINED AUGMENTATION MAMMAPLASTY AND ABDOMINOPLASTY  2009   W/  BILATERAL  THIGH LIFT   CYSTO/  RIGHT URETERAL STENT PLACEMENT  12/30/2010   CYSTOSCOPY WITH RETROGRADE PYELOGRAM, URETEROSCOPY AND STENT PLACEMENT Right 10/07/2013   Procedure: CYSTOSCOPY WITH RETROGRADE PYELOGRAM, right URETEROSCOPY AND STENT PLACEMENT, stone extraction;  Surgeon: Danae Chen, MD;  Location: [facility];  Service: Urology;  Laterality: Right;   CYSTOSCOPY WITH STENT PLACEMENT Right 06/04/2013   Procedure: CYSTOSCOPY WITH STENT PLACEMENT;  Surgeon: Marcine Matar, MD;  Location: [facility];  Service: Urology;  Laterality: Right;   DILATATION & CURETTAGE/HYSTEROSCOPY WITH MYOSURE N/A 04/27/2015   Procedure: DILATATION & CURETTAGE/HYSTEROSCOPY WITH MYOSURE;  Surgeon: Ok Edwards, MD;  Location: [facility] ORS;  Service: Gynecology;  Laterality: N/A;   ENDARTERECTOMY FEMORAL Left 06/08/2014   Procedure: Left Leg Common Femoral and External Iliac  Endartarectomy with patch Angioplasty;  Surgeon: Larina Earthly, MD;  Location: [facility] OR;  Service: Vascular;  Laterality: Left;   ESOPHAGOGASTRODUODENOSCOPY N/A 10/14/2018   Procedure: ESOPHAGOGASTRODUODENOSCOPY (EGD);  Surgeon: Lemar Lofty., MD;  Location: [facility] ENDOSCOPY;  Service: Gastroenterology;  Laterality: N/A;   ESOPHAGOGASTRODUODENOSCOPY (EGD) WITH PROPOFOL N/A 11/06/2018   Procedure: ESOPHAGOGASTRODUODENOSCOPY (EGD) WITH PROPOFOL;  Surgeon: Meridee Score Netty Starring., MD;  Location: [facility] ENDOSCOPY;  Service: Gastroenterology;  Laterality: N/A;  RFA   ESOPHAGOGASTRODUODENOSCOPY (EGD) WITH PROPOFOL N/A 02/12/2019   Procedure: ESOPHAGOGASTRODUODENOSCOPY (EGD) WITH PROPOFOL;  Surgeon: Meridee Score Netty Starring., MD;  Location: [facility] ENDOSCOPY;  Service: Gastroenterology;  Laterality: N/A;   ESOPHAGOGASTRODUODENOSCOPY (EGD) WITH PROPOFOL N/A 08/04/2019  Procedure: ESOPHAGOGASTRODUODENOSCOPY (EGD) WITH PROPOFOL;  Surgeon: Meridee Score Netty Starring., MD;  Location: [facility] ENDOSCOPY;  Service: Gastroenterology;  Laterality: N/A;  WITH RFA   EXTRACORPOREAL SHOCK WAVE LITHOTRIPSY Right 08-04-2013//   06-23-2013//   01-16-2011   EYE SURGERY Bilateral    cataract   FEMORAL-POPLITEAL BYPASS GRAFT Left 06/08/2014   Procedure: Left Leg Femoral -Popliteal Bypass Graft;  Surgeon: Larina Earthly, MD;  Location: [facility] OR;  Service: Vascular;  Laterality: Left;   FEMORAL-POPLITEAL BYPASS GRAFT Left 06/09/2014   Procedure: Left Femoral and Popliteal Exposure; Left Femoral to Anterior Tibial Bypass Graft using Propaten 6mm by 80cm Goretex  Graft; Left Tibial Endarterectomy; Left Femoraland Popliteal Thrombectomy ;  Surgeon: Nada Libman, MD;  Location: [facility] OR;  Service: Vascular;  Laterality: Left;   FEMORAL-TIBIAL BYPASS GRAFT Right 03/09/2023   Procedure: RIGHT FEMORAL-POSTERIOR TIBIAL ARTERY BYPASS USING X 80CM PROPATEN GRAFT;  Surgeon: Nada Libman, MD;  Location: [facility] OR;  Service: Vascular;  Laterality: Right;   GI RADIOFREQUENCY ABLATION N/A 11/06/2018   Procedure: GI RADIOFREQUENCY ABLATION;  Surgeon: Lemar Lofty., MD;  Location: [facility] ENDOSCOPY;  Service: Gastroenterology;  Laterality: N/A;   GI RADIOFREQUENCY ABLATION N/A 02/12/2019   Procedure: GI RADIOFREQUENCY ABLATION;  Surgeon: Meridee Score Netty Starring., MD;  Location: [facility] ENDOSCOPY;  Service: Gastroenterology;  Laterality: N/A;   GI RADIOFREQUENCY ABLATION N/A 08/04/2019   Procedure: GI RADIOFREQUENCY ABLATION;  Surgeon: Meridee Score Netty Starring., MD;  Location: [facility] ENDOSCOPY;  Service: Gastroenterology;  Laterality: N/A;   HOLMIUM LASER APPLICATION Right 10/07/2013   Procedure: HOLMIUM LASER APPLICATION;  Surgeon: Danae Chen, MD;  Location: [facility];  Service: Urology;  Laterality: Right;   KIDNEY STONE SURGERY  07/2013   1-2 stones   LAPAROSCOPIC GASTRIC BANDING  05/29/2005   LITHOTRIPSY  2-3 times   LOWER EXTREMITY ANGIOGRAM N/A 06/04/2014   Procedure: LOWER EXTREMITY ANGIOGRAM;  Surgeon: Nada Libman, MD;  Location: [facility] CATH LAB;  Service: Cardiovascular;  Laterality: N/A;   ORIF FIFTH METACARPAL FX  RIGHT HAND  04/21/2002   PATCH ANGIOPLASTY Right 03/09/2023   Procedure: VEIN PATCH ANGIOPLASTY TO RIGHT FEMORAL ARTERY;  Surgeon: Nada Libman, MD;  Location: [facility] OR;  Service: Vascular;  Laterality: Right;   POLYPECTOMY     REVISION AND RE-SITING LAP-BAND PORT  04/08/2010   W/  UPPER EGD   RIGHT KNEE PATELLECTOMY W/ REPAIR OF EXTENSOR MECHANISM  04/14/2002   TEE WITHOUT CARDIOVERSION N/A 12/24/2019   Procedure: TRANSESOPHAGEAL  ECHOCARDIOGRAM (TEE);  Surgeon: Quintella Reichert, MD;  Location: [facility] ENDOSCOPY;  Service: Cardiovascular;  Laterality: N/A;   Toenail removed Left 05/21/2014   2nd toenail-  Dr. Elvin So    Prior to Admission medications   Medication Sig Start Date End Date Taking? Authorizing Provider  aspirin EC 81 MG tablet Take 1 tablet (81 mg total) by mouth daily. Swallow whole. 03/12/23  Yes Clinton Gallant M, PA-C  bisacodyl (DULCOLAX) 5 MG EC tablet Take 5 mg by mouth daily as needed for moderate constipation.   Yes [provider]  cetirizine (ZYRTEC) 10 MG tablet Take 10 mg by mouth daily as needed for allergies.   Yes [provider]  clopidogrel (PLAVIX) 75 MG tablet Take 1 tablet (75 mg total) by mouth daily at 6 (six) AM. 04/02/23  Yes Emilie Rutter, PA-C  diltiazem (CARDIZEM CD) 180 MG 24 hr capsule Take 1 capsule (180 mg total) by mouth daily. 02/26/23 05/27/23 Yes Jodelle Gross, NP  diltiazem (  CARDIZEM) 30 MG tablet Take 1 tablet every 4 hours AS NEEDED for AFIB heart rate >100 as long as top BP >100. 02/28/23  Yes Jodelle Gross, NP  Docusate Sodium (COLACE PO) Take 1 tablet by mouth daily as needed.   Yes [provider]  dorzolamide-timolol (COSOPT) 22.3-6.8 MG/ML ophthalmic solution Place 1 drop into the right eye 2 (two) times daily.  02/05/19  Yes [provider]  ezetimibe (ZETIA) 10 MG tablet Take 1 tablet (10 mg total) by mouth daily. 02/06/23  Yes Etta Grandchild, MD  HUMALOG KWIKPEN 100 UNIT/ML KwikPen DIAL 14 UNITS AND INJECT UNDER THE SKIN THREE TIMES A DAY. Patient taking differently: Inject 14 Units into the skin 3 (three) times daily as needed. 02/12/23  Yes Etta Grandchild, MD  hydrALAZINE (APRESOLINE) 50 MG tablet Take 1 tablet (50 mg total) by mouth 3 (three) times daily. Patient taking differently: Take 50 mg by mouth 2 (two) times daily. 02/20/23  Yes Etta Grandchild, MD  Insulin Glargine [facility] KWIKPEN) 100 UNIT/ML Inject 60 Units  into the skin daily. Patient taking differently: Inject 50 Units into the skin at bedtime. 02/23/23  Yes Etta Grandchild, MD  latanoprost (XALATAN) 0.005 % ophthalmic solution Place 1 drop into both eyes at bedtime.  06/18/17  Yes [provider]  MAGnesium-Oxide 400 (240 Mg) MG tablet Take 1 tablet (400 mg total) by mouth daily. 02/07/23  Yes Etta Grandchild, MD  nebivolol (BYSTOLIC) 10 MG tablet Take 1 tablet by mouth once daily 10/02/22  Yes Skains, Veverly Fells, MD  ondansetron (ZOFRAN) 4 MG tablet Take 1 tablet (4 mg total) by mouth every 8 (eight) hours as needed for nausea or vomiting. 03/13/23 03/12/24 Yes Lars Mage, PA-C  oxyCODONE (OXY IR/ROXICODONE) 5 MG immediate release tablet Take 1-2 tablets (5-10 mg total) by mouth every 4 (four) hours as needed for moderate pain (pain score 4-6). 03/12/23  Yes Clinton Gallant M, PA-C  pantoprazole (PROTONIX) 40 MG tablet Take 1 tablet (40 mg total) by mouth daily. 04/02/23  Yes Emilie Rutter, PA-C  rosuvastatin (CRESTOR) 5 MG tablet Take 1 tablet (5 mg total) by mouth daily. 07/04/22  Yes Jake Bathe, MD  Semaglutide, 1 MG/DOSE, (OZEMPIC, 1 MG/DOSE,) 4 MG/3ML SOPN Inject 1 mg into the skin once a week. Via [employer] patient assistance 04/06/22  Yes Etta Grandchild, MD  SYNJARDY 5-500 MG TABS Take 1 tablet by mouth twice daily 03/16/23  Yes Etta Grandchild, MD  XARELTO 20 MG TABS tablet Take 1 tablet by mouth once daily 12/09/22  Yes Etta Grandchild, MD  Blood Glucose Monitoring Suppl (ONETOUCH VERIO IQ SYSTEM) w/Device KIT USE TO CHECK SUGAR DAILY 10/11/18   Etta Grandchild, MD  Insulin Pen Needle (PEN NEEDLES) 32G X 4 MM MISC Inject 1 pen  as directed 4 (four) times daily. Use to inject levemir or novolog as directed. 12/08/21   Etta Grandchild, MD  [facility] VERIO test strip USE TO check blood glucose TWICE DAILY 04/25/22   Etta Grandchild, MD    Current Facility-Administered Medications  Medication Dose Route Frequency Provider Last Rate Last  Admin   0.9 %  sodium chloride infusion (Manually program via Guardrails IV Fluids)   Intravenous Once Synetta Fail, MD       acetaminophen (TYLENOL) tablet 650 mg  650 mg Oral Q6H PRN Synetta Fail, MD       Or   acetaminophen (TYLENOL)  suppository 650 mg  650 mg Rectal Q6H PRN Synetta Fail, MD       digoxin (LANOXIN) 0.25 MG/ML injection 0.25 mg  0.25 mg Intravenous Q6H Leroy Sea, MD   0.25 mg at 04/07/23 0736   diltiazem (CARDIZEM CD) 24 hr capsule 180 mg  180 mg Oral Daily Synetta Fail, MD       diltiazem (CARDIZEM) injection 10 mg  10 mg Intravenous Q6H PRN Leroy Sea, MD   10 mg at 04/07/23 0606   dorzolamide-timolol (COSOPT) 2-0.5 % ophthalmic solution 1 drop  1 drop Right Eye BID Synetta Fail, MD   1 drop at 04/06/23 2331   ezetimibe (ZETIA) tablet 10 mg  10 mg Oral Daily Synetta Fail, MD       insulin aspart (novoLOG) injection 0-15 Units  0-15 Units Subcutaneous Q4H Synetta Fail, MD   3 Units at 04/06/23 2339   insulin glargine-yfgn (SEMGLEE) injection 25 Units  25 Units Subcutaneous QHS Synetta Fail, MD   25 Units at 04/06/23 2332   latanoprost (XALATAN) 0.005 % ophthalmic solution 1 drop  1 drop Both Eyes QHS Synetta Fail, MD   1 drop at 04/06/23 2331   nebivolol (BYSTOLIC) tablet 10 mg  10 mg Oral Daily Synetta Fail, MD       pantoprazole (PROTONIX) injection 80 mg  80 mg Intravenous Q12H Synetta Fail, MD       polyethylene glycol (MIRALAX / GLYCOLAX) packet 17 g  17 g Oral Daily PRN Synetta Fail, MD       rosuvastatin (CRESTOR) tablet 5 mg  5 mg Oral Daily Synetta Fail, MD       sodium chloride flush (NS) 0.9 % injection 3 mL  3 mL Intravenous Q12H Synetta Fail, MD   3 mL at 04/06/23 2323    Allergies as of 04/06/2023 - Review Complete 04/06/2023  Allergen Reaction Noted   Amlodipine Swelling 11/26/2013   Atorvastatin Other (See Comments) 10/28/2013   Ampicillin Nausea  And Vomiting and Other (See Comments) 04/12/2011   Codeine Nausea And Vomiting    Lisinopril Cough 10/05/2008   Penicillins Nausea And Vomiting 05/01/2011   Januvia [sitagliptin] Nausea And Vomiting 12/21/2011    Family History  Problem Relation Age of Onset   Kidney disease Mother    Hyperlipidemia Mother    Other Mother        AAA   and    Amputation   Diabetes Father    Heart disease Father    Deep vein thrombosis Father    Hyperlipidemia Father    Diabetes Sister    Heart disease Sister    Deep vein thrombosis Sister    Hyperlipidemia Sister    Diabetes Brother    Heart disease Brother    Cancer Brother    Hyperlipidemia Brother    Diabetes Brother    Diabetes Brother    Colon polyps Brother    Colon cancer Maternal Aunt    Arthritis Other    Cancer Other        colon   Hypertension Other    Stroke Other    Esophageal cancer Neg Hx    Stomach cancer Neg Hx    Inflammatory bowel disease Neg Hx    Liver disease Neg Hx    Pancreatic cancer Neg Hx    Crohn's disease Neg Hx    Rectal cancer Neg Hx     Social  History   Socioeconomic History   Marital status: Single    Spouse name: Not on file   Number of children: Not on file   Years of education: Not on file   Highest education level: Not on file  Occupational History   Occupation: Higher education careers adviser: [employer]  Tobacco Use   Smoking status: Former    Current packs/day: 0.00    Average packs/day: 1 pack/day for 28.0 years (28.0 ttl pk-yrs)    Types: Cigarettes    Start date: 09/05/1973    Quit date: 09/05/2001    Years since quitting: 21.6    Passive exposure: Never   Smokeless tobacco: Never  Vaping Use   Vaping status: Never Used  Substance and Sexual Activity   Alcohol use: No    Alcohol/week: 0.0 standard drinks of alcohol   Drug use: No   Sexual activity: Yes  Other Topics Concern   Not on file  Social History Narrative   Regular exercise- yes   Social Determinants of Health    Financial Resource Strain: Low Risk  (12/07/2021)   Overall Financial Resource Strain (CARDIA)    Difficulty of Paying Living Expenses: Not hard at all  Food Insecurity: No Food Insecurity (04/06/2023)   Hunger Vital Sign    Worried About Running Out of Food in the Last Year: Never true    Ran Out of Food in the Last Year: Never true  Transportation Needs: No Transportation Needs (04/06/2023)   PRAPARE - Administrator, Civil Service (Medical): No    Lack of Transportation (Non-Medical): No  Physical Activity: Inactive (12/07/2021)   Exercise Vital Sign    Days of Exercise per Week: 0 days    Minutes of Exercise per Session: 0 min  Stress: No Stress Concern Present (12/07/2021)   [employer] of Occupational Health - Occupational Stress Questionnaire    Feeling of Stress : Not at all  Social Connections: Moderately Integrated (12/07/2021)   Social Connection and Isolation Panel [NHANES]    Frequency of Communication with Friends and Family: More than three times a week    Frequency of Social Gatherings with Friends and Family: More than three times a week    Attends Religious Services: More than 4 times per year    Active Member of [employer] or Organizations: Yes    Attends Banker Meetings: More than 4 times per year    Marital Status: Never married  Intimate Partner Violence: Not At Risk (04/06/2023)   Humiliation, Afraid, Rape, and Kick questionnaire    Fear of Current or Ex-Partner: No    Emotionally Abused: No    Physically Abused: No    Sexually Abused: No    Review of Systems: Gen: Denies fever, sweats or chills. No weight loss.  CV: Denies chest pain, palpitations or edema. Resp: Denies cough, shortness of breath of hemoptysis.  GI: See HPI.  GU : Denies urinary burning, blood in urine, increased urinary frequency or incontinence. MS: Denies joint pain, muscles aches or weakness. Derm: Denies rash, itchiness, skin lesions or unhealing  ulcers. Psych:+ Anxiety and depression.  Heme: Denies easy bruising, bleeding. Neuro:  Denies headaches, dizziness or paresthesias. Endo: + DM type II.  Physical Exam: Vital signs in last 24 hours: Temp:  [97.2 F (36.2 C)-98.5 F (36.9 C)] 97.2 F (36.2 C) (12/07 0426) Pulse Rate:  [99-136] 136 (12/07 0426) Resp:  [16-22] 18 (12/07 0426) BP: (99-131)/(59-84) 109/67 (12/07 0426) SpO2:  [  100 %] 100 % (12/07 0426) Weight:  [74.8 kg-76 kg] 76 kg (12/06 2035)   General: Alert 67 year old female in no acute distress. Head:  Normocephalic and atraumatic. Eyes:  No scleral icterus. Conjunctiva pink. Ears:  Normal auditory acuity. Nose:  No deformity, discharge or lesions. Mouth:  Dentition intact. No ulcers or lesions.  Neck:  Supple. No lymphadenopathy or thyromegaly.  Lungs: Breath sounds clear throughout. No wheezes, rhonchi or crackles.  Heart: Tachycardic, rhythm intermittently irregular, no murmur. Abdomen:, Nondistended.  Nontender.  Positive bowel sounds all 4 quadrants. Rectal: Deferred.  FOBT resulted negative. Musculoskeletal:  Symmetrical without gross deformities.  Pulses:  Normal pulses noted. Extremities:  Without clubbing or edema. Neurologic:  Alert and  oriented x 4. No focal deficits.  Skin:  Intact without significant lesions or rashes. Psych:  Alert and cooperative. Normal mood and affect.  Intake/Output from previous day: 12/06 0701 - 12/07 0700 In: 414 [I.V.:50; Blood:364] Out: -  Intake/Output this shift: No intake/output data recorded.  Lab Results: Recent Labs    04/06/23 1055 04/06/23 1514  WBC 11.6* 9.7  HGB 6.4 Repeated and verified X2.* 6.3*  HCT 21.3 Repeated and verified X2.* 22.2*  PLT 348.0 365   BMET Recent Labs    04/06/23 1055 04/06/23 1514  NA 141 141  K 3.8 4.6  CL 108 112*  CO2 22 19*  GLUCOSE 202* 239*  BUN 14 14  CREATININE 0.90 1.05*  CALCIUM 8.9 9.1   LFT Recent Labs    04/06/23 1514  PROT 5.9*  ALBUMIN 3.1*   AST 14*  ALT 11  ALKPHOS 52  BILITOT 0.4   PT/INR No results for input(s): "LABPROT", "INR" in the last 72 hours. Hepatitis Panel No results for input(s): "HEPBSAG", "HCVAB", "HEPAIGM", "HEPBIGM" in the last 72 hours.    Studies/Results: No results found.  IMPRESSION/PLAN:  67 year old female admitted with acute on chronic iron deficiency anemia melena on Plavix, Xarelto and ASA.  Admission hemoglobin 6.4 down from 9 two weeks ago, transfused 1 unit of PRBCs 12/6 -> Hg 8.0.  Last black stool was on 12/5.  Last dose of ASA, Plavix and Xarelto was 8 AM on 12/6.  Prior EGD 07/2022 showed extrinsic compression on the stomach secondary to Lap-Band otherwise was unremarkable.  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 -Clear liquid diet -NPO after midnight  -EGD tomorrow if cardiac status stable benefits and risks discussed including risk with sedation, risk of bleeding, perforation and infection  -PPI 40mg   IV bid -Continue to hold ASA, Plavix and Xarelto -CBC in a.m. 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  Colonoscopy 08/09/2022 identified 3 tubular adenomatous polyps removed from the colon.  Atrial fibrillation/flutter with RVR, on Cardizem IV  History of diabetes mellitus type 2.  Last dose of Ozempic was Sunday 12/1.  CKD stage II   Arnaldo Natal  04/07/2023, 08:59AM

## 2023-04-07 NOTE — Care Management Obs Status (Signed)
MEDICARE OBSERVATION STATUS NOTIFICATION   Patient Details  Name: Theresa Harrington MRN: [id number] Date of Birth: October 28, 1955   Medicare Observation Status Notification Given:  Yes    Lawerance Sabal, RN 04/07/2023, 3:08 PM

## 2023-04-07 NOTE — Progress Notes (Signed)
PROGRESS NOTE                                                                                                                                                                                                             Patient Demographics:    Theresa Harrington, is a 67 y.o. female, DOB - [birth year], ZOX:[id number]  Outpatient Primary MD for the patient is Etta Grandchild, MD    LOS - 0  Admit date - 04/06/2023    Chief Complaint  Patient presents with   Post-op Problem       Brief Narrative (HPI from H&P)    67 y.o. female with medical history significant of hypertension, hyperlipidemia, iron deficiency anemia, PAD, GERD, Barrett's esophagus, atrial fibrillation, diabetes, CKD 2, status post lap band, spinal stenosis, depression presenting with abnormal lab and symptomatic anemia.   Patient underwent femoropopliteal bypass on November 8 and has been doing well from that standpoint after the procedure.  She did, however, noticed some increasing fatigue and increasing dyspnea on exertion for the past week or so.  She was at follow-up visit with her PCP today and had lab work drawn which showed hemoglobin of 6.4 down from previous of 8-9.  She was sent to the ED for further evaluation. She does have intermittent dark stools for few days.  In the ER she was diagnosed with severe anemia and admitted for further workup.   Subjective:    Aevah Worlds today has, No headache, No chest pain, No abdominal pain - No Nausea, No new weakness tingling or numbness, no shortness of breath   Assessment  & Plan :   Symptomatic iron deficiency anemia likely due to intermittent occult GI blood loss versus subacute intermittent upper GI bleed. She was on combination of aspirin, Plavix and Xarelto, all held.  Received 1 unit of packed RBC, continue to monitor CBC, no ongoing brisk bleeding, IV PPI, GI to see.  Paroxysmal A-fib.  [place] vas 2 score of greater  than 3.  Currently in RVR.  Initiate IV Cardizem drip, IV digoxin 2 doses, oral Cardizem and beta-blocker to continue.  Monitor on telemetry.  Check TSH.   PAD recent surgery for femoral-tibial bypass graft occlusion on the right side.  Recent femoropopliteal bypass.  Aspirin Plavix and Xarelto held due to #1 above, continue statin and Zetia  for secondary prevention.  Will inform vascular surgery as well.  Hypertension.  Continue beta-blocker and Cardizem as above.  GERD with history of Barrett's esophagus.  Currently IV PPI.  CKD stage II.  Stable.  DM type II.  Continue long-acting insulin along with sliding scale.  Monitor and adjust.  Lab Results  Component Value Date   HGBA1C 8.4 (H) 02/20/2023   CBG (last 3)  Recent Labs    04/06/23 2337 04/07/23 0423 04/07/23 0804  GLUCAP 193* 116* 115*        Condition - Guarded  Family Communication  : None present  Code Status :  Full  Consults  :  GI, VVS  PUD Prophylaxis : PPI   Procedures  :           Disposition Plan  :    Status is: Observation  DVT Prophylaxis  :    SCDs Start: 04/06/23 1845    Lab Results  Component Value Date   PLT 325 04/07/2023    Diet :  Diet Order             Diet clear liquid Room service appropriate? Yes; Fluid consistency: Thin  Diet effective now                    Inpatient Medications  Scheduled Meds:  sodium chloride   Intravenous Once   digoxin  0.25 mg Intravenous Q6H   diltiazem  180 mg Oral Daily   dorzolamide-timolol  1 drop Right Eye BID   ezetimibe  10 mg Oral Daily   insulin aspart  0-15 Units Subcutaneous Q4H   insulin glargine-yfgn  25 Units Subcutaneous QHS   latanoprost  1 drop Both Eyes QHS   nebivolol  10 mg Oral Daily   pantoprazole (PROTONIX) IV  80 mg Intravenous Q12H   rosuvastatin  5 mg Oral Daily   sodium chloride flush  3 mL Intravenous Q12H   Continuous Infusions:  diltiazem (CARDIZEM) infusion     PRN Meds:.acetaminophen **OR**  acetaminophen, diltiazem, polyethylene glycol  Antibiotics  :    Anti-infectives (From admission, onward)    None         Objective:   Vitals:   04/07/23 0426 04/07/23 0742 04/07/23 0745 04/07/23 0800  BP: 109/67 122/76 117/79 114/78  Pulse: (!) 136 (!) 138 (!) 137 (!) 137  Resp: 18 (!) 21 20 16   Temp: (!) 97.2 F (36.2 C)     TempSrc: Oral     SpO2: 100% 99% 99% 99%  Weight:      Height:        Wt Readings from Last 3 Encounters:  04/06/23 76 kg  04/06/23 74.8 kg  04/06/23 75.8 kg     Intake/Output Summary (Last 24 hours) at 04/07/2023 0855 Last data filed at 04/06/2023 2100 Gross per 24 hour  Intake 414 ml  Output --  Net 414 ml     Physical Exam  Awake Alert, No new F.N deficits, Normal affect [place].AT,PERRAL Supple Neck, No JVD,   Symmetrical Chest wall movement, Good air movement bilaterally, CTAB Rapid RRR,No Gallops,Rubs or new Murmurs,  +ve B.Sounds, Abd Soft, No tenderness,   No Cyanosis, Clubbing or edema         Data Review:    Recent Labs  Lab 04/06/23 1055 04/06/23 1514 04/07/23 0607  WBC 11.6* 9.7 9.0  HGB 6.4 Repeated and verified X2.* 6.3* 8.0*  HCT 21.3 Repeated and verified X2.* 22.2* 25.6*  PLT 348.0 365 325  MCV 79.5 83.5 81.3  MCH  --  23.7* 25.4*  MCHC 30.3 28.4* 31.3  RDW 17.8* 17.4* 16.2*  LYMPHSABS 2.7 1.9  --   MONOABS 0.8 0.6  --   EOSABS 0.1 0.1  --   BASOSABS 0.1 0.1  --     Recent Labs  Lab 04/06/23 1055 04/06/23 1514 04/07/23 0607  NA 141 141  --   K 3.8 4.6  --   CL 108 112*  --   CO2 22 19*  --   ANIONGAP  --  10  --   GLUCOSE 202* 239*  --   BUN 14 14  --   CREATININE 0.90 1.05*  --   AST 8 14*  --   ALT 8 11  --   ALKPHOS 62 52  --   BILITOT 0.3 0.4  --   ALBUMIN 3.8 3.1*  --   TSH 0.56  --   --   BNP  --   --  510.7*  CALCIUM 8.9 9.1  --       Recent Labs  Lab 04/06/23 1055 04/06/23 1514 04/07/23 0607  TSH 0.56  --   --   BNP  --   --  510.7*  CALCIUM 8.9 9.1  --      --------------------------------------------------------------------------------------------------------------- Lab Results  Component Value Date   CHOL 92 03/10/2023   HDL 38 (L) 03/10/2023   LDLCALC 38 03/10/2023   LDLDIRECT 160.2 03/01/2010   TRIG 81 03/10/2023   CHOLHDL 2.4 03/10/2023    Lab Results  Component Value Date   HGBA1C 8.4 (H) 02/20/2023   Recent Labs    04/06/23 1055  TSH 0.56   Recent Labs    04/06/23 1055  FERRITIN 10.8  TIBC 394.8  IRON 14*      Signature  -   Susa Raring M.D on 04/07/2023 at 8:55 AM   -  To page go to [email or web address]

## 2023-04-07 NOTE — Plan of Care (Signed)
Pt has rested quietly throughout the night with no distress noted. Alert and oriented. On room air. Atrial flutter on monitor. Up to BR to void. Received 1 unit of blood. No complaints voiced.     Problem: Education: Goal: Knowledge of General Education information will improve Description: Including pain rating scale, medication(s)/side effects and non-pharmacologic comfort measures Outcome: Progressing   Problem: Clinical Measurements: Goal: Diagnostic test results will improve Outcome: Progressing   Problem: Activity: Goal: Risk for activity intolerance will decrease Outcome: Progressing   Problem: Coping: Goal: Level of anxiety will decrease Outcome: Progressing   Problem: Pain Management: Goal: General experience of comfort will improve Outcome: Progressing

## 2023-04-08 DIAGNOSIS — D509 Iron deficiency anemia, unspecified: Secondary | ICD-10-CM | POA: Diagnosis not present

## 2023-04-08 DIAGNOSIS — I4891 Unspecified atrial fibrillation: Secondary | ICD-10-CM | POA: Diagnosis not present

## 2023-04-08 DIAGNOSIS — Z7901 Long term (current) use of anticoagulants: Secondary | ICD-10-CM | POA: Diagnosis not present

## 2023-04-08 DIAGNOSIS — K921 Melena: Secondary | ICD-10-CM | POA: Diagnosis not present

## 2023-04-08 DIAGNOSIS — K922 Gastrointestinal hemorrhage, unspecified: Secondary | ICD-10-CM | POA: Diagnosis not present

## 2023-04-08 LAB — BASIC METABOLIC PANEL
Anion gap: 8 (ref 5–15)
BUN: 8 mg/dL (ref 8–23)
CO2: 19 mmol/L — ABNORMAL LOW (ref 22–32)
Calcium: 8.9 mg/dL (ref 8.9–10.3)
Chloride: 112 mmol/L — ABNORMAL HIGH (ref 98–111)
Creatinine, Ser: 0.92 mg/dL (ref 0.44–1.00)
GFR, Estimated: >60 mL/min (ref >60)
Glucose, Bld: 138 mg/dL — ABNORMAL HIGH (ref 70–99)
Potassium: 3.7 mmol/L (ref 3.5–5.1)
Sodium: 139 mmol/L (ref 135–145)

## 2023-04-08 LAB — CBC WITH DIFFERENTIAL/PLATELET
Abs Immature Granulocytes: 0.03 10*3/uL (ref 0.00–0.07)
Basophils Absolute: 0.1 10*3/uL (ref 0.0–0.1)
Basophils Relative: 1 %
Eosinophils Absolute: 0.2 10*3/uL (ref 0.0–0.5)
Eosinophils Relative: 2 %
HCT: 28.9 % — ABNORMAL LOW (ref 36.0–46.0)
Hemoglobin: 9 g/dL — ABNORMAL LOW (ref 12.0–15.0)
Immature Granulocytes: 0 %
Lymphocytes Relative: 28 %
Lymphs Abs: 2.3 10*3/uL (ref 0.7–4.0)
MCH: 25.4 pg — ABNORMAL LOW (ref 26.0–34.0)
MCHC: 31.1 g/dL (ref 30.0–36.0)
MCV: 81.6 fL (ref 80.0–100.0)
Monocytes Absolute: 0.8 10*3/uL (ref 0.1–1.0)
Monocytes Relative: 9 %
Neutro Abs: 4.9 10*3/uL (ref 1.7–7.7)
Neutrophils Relative %: 60 %
Platelets: 339 10*3/uL (ref 150–400)
RBC: 3.54 MIL/uL — ABNORMAL LOW (ref 3.87–5.11)
RDW: 16.2 % — ABNORMAL HIGH (ref 11.5–15.5)
WBC: 8.2 10*3/uL (ref 4.0–10.5)
nRBC: 0.5 % — ABNORMAL HIGH (ref 0.0–0.2)

## 2023-04-08 LAB — GLUCOSE, CAPILLARY
Glucose-Capillary: 117 mg/dL — ABNORMAL HIGH (ref 70–99)
Glucose-Capillary: 130 mg/dL — ABNORMAL HIGH (ref 70–99)
Glucose-Capillary: 147 mg/dL — ABNORMAL HIGH (ref 70–99)
Glucose-Capillary: 153 mg/dL — ABNORMAL HIGH (ref 70–99)
Glucose-Capillary: 154 mg/dL — ABNORMAL HIGH (ref 70–99)
Glucose-Capillary: 157 mg/dL — ABNORMAL HIGH (ref 70–99)
Glucose-Capillary: 191 mg/dL — ABNORMAL HIGH (ref 70–99)

## 2023-04-08 LAB — MAGNESIUM: Magnesium: 1.8 mg/dL (ref 1.7–2.4)

## 2023-04-08 LAB — BRAIN NATRIURETIC PEPTIDE: B Natriuretic Peptide: 377.9 pg/mL — ABNORMAL HIGH (ref 0.0–100.0)

## 2023-04-08 MED ORDER — METOPROLOL TARTRATE 5 MG/5ML IV SOLN
5.0000 mg | Freq: Three times a day (TID) | INTRAVENOUS | Status: DC | PRN
  Administered 2023-04-08: 5 mg via INTRAVENOUS
  Filled 2023-04-08: qty 5

## 2023-04-08 MED ORDER — LACTATED RINGERS IV BOLUS
500.0000 mL | Freq: Once | INTRAVENOUS | Status: AC
  Administered 2023-04-08: 500 mL via INTRAVENOUS

## 2023-04-08 MED ORDER — LACTATED RINGERS IV BOLUS
500.0000 mL | Freq: Once | INTRAVENOUS | Status: AC
Start: 2023-04-08 — End: 2023-04-08
  Administered 2023-04-08: 500 mL via INTRAVENOUS

## 2023-04-08 MED ORDER — DIGOXIN 0.25 MG/ML IJ SOLN
0.5000 mg | Freq: Once | INTRAMUSCULAR | Status: AC
  Administered 2023-04-08: 0.5 mg via INTRAVENOUS
  Filled 2023-04-08: qty 2

## 2023-04-08 MED ORDER — DILTIAZEM HCL 60 MG PO TABS
60.0000 mg | ORAL_TABLET | Freq: Three times a day (TID) | ORAL | Status: DC
  Administered 2023-04-08: 60 mg via ORAL
  Filled 2023-04-08: qty 1

## 2023-04-08 MED ORDER — POTASSIUM CHLORIDE CRYS ER 20 MEQ PO TBCR
40.0000 meq | EXTENDED_RELEASE_TABLET | Freq: Once | ORAL | Status: AC
  Administered 2023-04-08: 40 meq via ORAL
  Filled 2023-04-08: qty 2

## 2023-04-08 MED ORDER — DILTIAZEM HCL-DEXTROSE 125-5 MG/125ML-% IV SOLN (PREMIX)
5.0000 mg/h | INTRAVENOUS | Status: DC
  Administered 2023-04-08: 5 mg/h via INTRAVENOUS
  Filled 2023-04-08: qty 125

## 2023-04-08 MED ORDER — METOPROLOL TARTRATE 25 MG PO TABS
25.0000 mg | ORAL_TABLET | Freq: Two times a day (BID) | ORAL | Status: DC
  Administered 2023-04-08: 25 mg via ORAL
  Filled 2023-04-08: qty 2

## 2023-04-08 MED ORDER — MAGNESIUM SULFATE IN D5W 1-5 GM/100ML-% IV SOLN
1.0000 g | Freq: Once | INTRAVENOUS | Status: AC
  Administered 2023-04-08: 1 g via INTRAVENOUS
  Filled 2023-04-08: qty 100

## 2023-04-08 NOTE — Anesthesia Preprocedure Evaluation (Signed)
Anesthesia Evaluation  Patient identified by MRN, date of birth, ID band Patient awake    Reviewed: Allergy & Precautions, NPO status , Patient's Chart, lab work & pertinent test results  History of Anesthesia Complications (+) PONV and history of anesthetic complications  Airway Mallampati: II  TM Distance: >3 FB Neck ROM: Full    Dental no notable dental hx.    Pulmonary former smoker   Pulmonary exam normal        Cardiovascular hypertension, Pt. on home beta blockers and Pt. on medications + Peripheral Vascular Disease  Normal cardiovascular exam+ dysrhythmias (on Xarelto) Atrial Fibrillation      Neuro/Psych   Anxiety Depression       GI/Hepatic Neg liver ROS,GERD  ,,melena   Endo/Other  diabetes, Type 2, Insulin DependentHypothyroidism    Renal/GU negative Renal ROS     Musculoskeletal  (+) Arthritis ,    Abdominal   Peds  Hematology  (+) Blood dyscrasia (Hgb 9.0), anemia   Anesthesia Other Findings Day of surgery medications reviewed with patient.  Reproductive/Obstetrics                             Anesthesia Physical Anesthesia Plan  ASA: 3  Anesthesia Plan: MAC   Post-op Pain Management: Minimal or no pain anticipated   Induction:   PONV Risk Score and Plan: 3 and Treatment may vary due to age or medical condition and Propofol infusion  Airway Management Planned: Natural Airway and Nasal Cannula  Additional Equipment: None  Intra-op Plan:   Post-operative Plan:   Informed Consent: I have reviewed the patients History and Physical, chart, labs and discussed the procedure including the risks, benefits and alternatives for the proposed anesthesia with the patient or authorized representative who has indicated his/her understanding and acceptance.       Plan Discussed with: CRNA  Anesthesia Plan Comments:        Anesthesia Quick Evaluation

## 2023-04-08 NOTE — Plan of Care (Signed)
  Problem: Education: Goal: Ability to describe self-care measures that may prevent or decrease complications (Diabetes Survival Skills Education) will improve Outcome: Progressing Goal: Individualized Educational Video(s) Outcome: Progressing   Problem: Coping: Goal: Ability to adjust to condition or change in health will improve Outcome: Progressing   Problem: Fluid Volume: Goal: Ability to maintain a balanced intake and output will improve Outcome: Progressing   Problem: Health Behavior/Discharge Planning: Goal: Ability to identify and utilize available resources and services will improve Outcome: Progressing Goal: Ability to manage health-related needs will improve Outcome: Progressing   Problem: Metabolic: Goal: Ability to maintain appropriate glucose levels will improve Outcome: Progressing   Problem: Nutritional: Goal: Maintenance of adequate nutrition will improve Outcome: Progressing Goal: Progress toward achieving an optimal weight will improve Outcome: Progressing   Problem: Skin Integrity: Goal: Risk for impaired skin integrity will decrease Outcome: Progressing   Problem: Tissue Perfusion: Goal: Adequacy of tissue perfusion will improve Outcome: Progressing   Problem: Education: Goal: Knowledge of General Education information will improve Description: Including pain rating scale, medication(s)/side effects and non-pharmacologic comfort measures Outcome: Progressing   Problem: Health Behavior/Discharge Planning: Goal: Ability to manage health-related needs will improve Outcome: Progressing   Problem: Clinical Measurements: Goal: Ability to maintain clinical measurements within normal limits will improve Outcome: Progressing Goal: Will remain free from infection Outcome: Progressing Goal: Diagnostic test results will improve Outcome: Progressing Goal: Respiratory complications will improve Outcome: Progressing Goal: Cardiovascular complication will  be avoided Outcome: Progressing   Problem: Activity: Goal: Risk for activity intolerance will decrease Outcome: Progressing   Problem: Nutrition: Goal: Adequate nutrition will be maintained Outcome: Progressing   Problem: Coping: Goal: Level of anxiety will decrease Outcome: Progressing   Problem: Elimination: Goal: Will not experience complications related to bowel motility Outcome: Progressing Goal: Will not experience complications related to urinary retention Outcome: Progressing   Problem: Pain Management: Goal: General experience of comfort will improve Outcome: Progressing   Problem: Safety: Goal: Ability to remain free from injury will improve Outcome: Progressing   Problem: Skin Integrity: Goal: Risk for impaired skin integrity will decrease Outcome: Progressing   Problem: Education: Goal: Ability to identify signs and symptoms of gastrointestinal bleeding will improve Outcome: Progressing   Problem: Bowel/Gastric: Goal: Will show no signs and symptoms of gastrointestinal bleeding Outcome: Progressing   Problem: Fluid Volume: Goal: Will show no signs and symptoms of excessive bleeding Outcome: Progressing   Problem: Clinical Measurements: Goal: Complications related to the disease process, condition or treatment will be avoided or minimized Outcome: Progressing

## 2023-04-08 NOTE — Progress Notes (Signed)
      Progress Note   Subjective  Patient has AF with RVR, not well controlled currently. BP okay. No further dark stools, Hgb has been stable overnight.    Objective   Vital signs in last 24 hours: Temp:  [97.5 F (36.4 C)-98.2 F (36.8 C)] 97.6 F (36.4 C) (12/08 0826) Pulse Rate:  [74-149] 117 (12/08 1034) Resp:  [14-22] 18 (12/08 1034) BP: (92-124)/(41-95) 121/71 (12/08 1034) SpO2:  [98 %-100 %] 100 % (12/08 0826) Last BM Date : 04/05/23 General:    white female in NAD Abdomen:  Soft, nontender and nondistended.  Neurologic:  Alert and oriented,  grossly normal neurologically. Psych:  Cooperative. Normal mood and affect.  Intake/Output from previous day: 12/07 0701 - 12/08 0700 In: 480 [P.O.:480] Out: -  Intake/Output this shift: No intake/output data recorded.  Lab Results: Recent Labs    04/06/23 1514 04/07/23 0607 04/08/23 0510  WBC 9.7 9.0 8.2  HGB 6.3* 8.0* 9.0*  HCT 22.2* 25.6* 28.9*  PLT 365 325 339   BMET Recent Labs    04/06/23 1514 04/07/23 0607 04/08/23 0510  NA 141 139 139  K 4.6 4.3 3.7  CL 112* 110 112*  CO2 19* 21* 19*  GLUCOSE 239* 114* 138*  BUN 14 11 8   CREATININE 1.05* 0.90 0.92  CALCIUM 9.1 8.7* 8.9   LFT Recent Labs    04/07/23 0607  PROT 5.8*  ALBUMIN 3.0*  AST 14*  ALT 11  ALKPHOS 50  BILITOT 0.8   PT/INR No results for input(s): "LABPROT", "INR" in the last 72 hours.  Studies/Results: No results found.     Assessment / Plan:    67 y/o female here for the following:  Dark stools / concern for melena Symptomatic anemia Iron deficiency Anticoagulated / antiplatelet therapy  See yesterday's consult note for details. Plan is for enteroscopy. Was hoping to do it today but AF with RVR persists and is not controlled. Her Hgb is stable post transfusion, no further bleeding symptoms. I do not think she is ready for enteroscopy today with ongoing AF with RVR. Will tentatively reschedule this for tomorrow AM with  Dr. Myrtie Neither. Hopefully this will allow more time to get HR controlled.  Continue protonix 40mg  twice daily. I will give her full liquid diet today and then NPO after MN again. Monitor Hgb and for recurrent bleeding. Continue to hold Eliquis / Plavix for now. If enteroscopy is negative I think she will need a capsule endoscopy.   Call with questions, we will reassess her tomorrow.  Harlin Rain, MD [facility]

## 2023-04-08 NOTE — Plan of Care (Signed)
Pt has rested quietly throughout the night with no distress noted. Alert and oriented. On room air. Atrial flutter on monitor. Medicated twice for elevated HR to 140-150 with cardizem with relief noted. Up to BR to void independently. NPO after MN for procedure in am. No complaints voiced.     Problem: Tissue Perfusion: Goal: Adequacy of tissue perfusion will improve Outcome: Progressing   Problem: Education: Goal: Knowledge of General Education information will improve Description: Including pain rating scale, medication(s)/side effects and non-pharmacologic comfort measures Outcome: Progressing   Problem: Pain Management: Goal: General experience of comfort will improve Outcome: Progressing

## 2023-04-08 NOTE — Progress Notes (Signed)
PROGRESS NOTE                                                                                                                                                                                                             Patient Demographics:    Theresa Harrington, is a 67 y.o. female, DOB - 1955/09/03, UEA:[id number]  Outpatient Primary MD for the patient is Etta Grandchild, MD    LOS - 1  Admit date - 04/06/2023    Chief Complaint  Patient presents with   Post-op Problem       Brief Narrative (HPI from H&P)    67 y.o. female with medical history significant of hypertension, hyperlipidemia, iron deficiency anemia, PAD, GERD, Barrett's esophagus, atrial fibrillation, diabetes, CKD 2, status post lap band, spinal stenosis, depression presenting with abnormal lab and symptomatic anemia.   Patient underwent femoropopliteal bypass on November 8 and has been doing well from that standpoint after the procedure.  She did, however, noticed some increasing fatigue and increasing dyspnea on exertion for the past week or so.  She was at follow-up visit with her PCP today and had lab work drawn which showed hemoglobin of 6.4 down from previous of 8-9.  She was sent to the ED for further evaluation. She does have intermittent dark stools for few days.  In the ER she was diagnosed with severe anemia and admitted for further workup.   Subjective:   Patient in bed, appears comfortable, denies any headache, no fever, no chest pain or pressure, no shortness of breath , no abdominal pain. No focal weakness.   Assessment  & Plan :   Symptomatic iron deficiency anemia likely due to intermittent occult GI blood loss versus subacute intermittent upper GI bleed. She was on combination of aspirin, Plavix and Xarelto, all held.  Received 1 unit of packed RBC, continue to monitor CBC, no ongoing brisk bleeding, IV PPI, GI on board likely endoscopy once RVR is in  better control.  Paroxysmal A-fib.  [place] vas 2 score of greater than 3.  Currently in RVR.  Stable TSH and free T4, RVR likely due to the stress of anemia, on oral Cardizem and Lopressor, dose adjusted, was on IV Cardizem drip on 04/07/2023 with good rate control however in RVR again this morning.  Beta-blocker and calcium channel blocker dose adjusted,  1 dose IV digoxin, reinitiate IV Cardizem drip on 04/08/2023 and continue today.   PAD recent surgery for femoral-tibial bypass graft occlusion on the right side.  Recent femoropopliteal bypass.  Aspirin Plavix and Xarelto held due to #1 above, continue statin and Zetia for secondary prevention.  Will inform vascular surgery as well.  Hypertension.  Continue beta-blocker and Cardizem as above.  GERD with history of Barrett's esophagus.  Currently IV PPI.  CKD stage II.  Stable.  DM type II.  Continue long-acting insulin along with sliding scale.  Monitor and adjust.  Lab Results  Component Value Date   HGBA1C 8.4 (H) 02/20/2023   CBG (last 3)  Recent Labs    04/08/23 0103 04/08/23 0444 04/08/23 0822  GLUCAP 117* 130* 153*        Condition - Guarded  Family Communication  : None present  Code Status :  Full  Consults  :  GI, VVS  PUD Prophylaxis : PPI   Procedures  :           Disposition Plan  :    Status is: Observation  DVT Prophylaxis  :    SCDs Start: 04/06/23 1845    Lab Results  Component Value Date   PLT 339 04/08/2023    Diet :  Diet Order             Diet NPO time specified Except for: Sips with Meds  Diet effective midnight                    Inpatient Medications  Scheduled Meds:  sodium chloride   Intravenous Once   diltiazem  60 mg Oral Q8H   dorzolamide-timolol  1 drop Right Eye BID   ezetimibe  10 mg Oral Daily   insulin aspart  0-15 Units Subcutaneous Q4H   insulin glargine-yfgn  25 Units Subcutaneous QHS   latanoprost  1 drop Both Eyes QHS   metoprolol tartrate  25 mg  Oral BID   pantoprazole (PROTONIX) IV  40 mg Intravenous Q12H   potassium chloride  40 mEq Oral Once   rosuvastatin  5 mg Oral Daily   sodium chloride flush  3 mL Intravenous Q12H   Continuous Infusions:  diltiazem (CARDIZEM) infusion 5 mg/hr (04/08/23 1020)   lactated ringers     magnesium sulfate bolus IVPB 1 g (04/08/23 1008)   PRN Meds:.acetaminophen **OR** acetaminophen, diltiazem, metoprolol tartrate, polyethylene glycol  Antibiotics  :    Anti-infectives (From admission, onward)    None         Objective:   Vitals:   04/08/23 0605 04/08/23 0826 04/08/23 0838 04/08/23 0845  BP:  108/70 102/74 104/78  Pulse:  (!) 149    Resp: 18 (!) 21 20 17   Temp:  97.6 F (36.4 C)    TempSrc:  Oral    SpO2:  100%    Weight:      Height:        Wt Readings from Last 3 Encounters:  04/06/23 76 kg  04/06/23 74.8 kg  04/06/23 75.8 kg     Intake/Output Summary (Last 24 hours) at 04/08/2023 1024 Last data filed at 04/08/2023 0608 Gross per 24 hour  Intake 480 ml  Output --  Net 480 ml     Physical Exam  Awake Alert, No new F.N deficits, Normal affect [place].AT,PERRAL Supple Neck, No JVD,   Symmetrical Chest wall movement, Good air movement bilaterally, CTAB Rapid RRR,No Gallops,Rubs or new  Murmurs,  +ve B.Sounds, Abd Soft, No tenderness,   No Cyanosis, Clubbing or edema         Data Review:    Recent Labs  Lab 04/06/23 1055 04/06/23 1514 04/07/23 0607 04/08/23 0510  WBC 11.6* 9.7 9.0 8.2  HGB 6.4 Repeated and verified X2.* 6.3* 8.0* 9.0*  HCT 21.3 Repeated and verified X2.* 22.2* 25.6* 28.9*  PLT 348.0 365 325 339  MCV 79.5 83.5 81.3 81.6  MCH  --  23.7* 25.4* 25.4*  MCHC 30.3 28.4* 31.3 31.1  RDW 17.8* 17.4* 16.2* 16.2*  LYMPHSABS 2.7 1.9  --  2.3  MONOABS 0.8 0.6  --  0.8  EOSABS 0.1 0.1  --  0.2  BASOSABS 0.1 0.1  --  0.1    Recent Labs  Lab 04/06/23 1055 04/06/23 1514 04/07/23 0607 04/08/23 0510  NA 141 141 139 139  K 3.8 4.6 4.3 3.7  CL  108 112* 110 112*  CO2 22 19* 21* 19*  ANIONGAP  --  10 8 8   GLUCOSE 202* 239* 114* 138*  BUN 14 14 11 8   CREATININE 0.90 1.05* 0.90 0.92  AST 8 14* 14*  --   ALT 8 11 11   --   ALKPHOS 62 52 50  --   BILITOT 0.3 0.4 0.8  --   ALBUMIN 3.8 3.1* 3.0*  --   TSH 0.56  --  0.634  --   BNP  --   --  510.7* 377.9*  MG  --   --  1.8 1.8  CALCIUM 8.9 9.1 8.7* 8.9      Recent Labs  Lab 04/06/23 1055 04/06/23 1514 04/07/23 0607 04/08/23 0510  TSH 0.56  --  0.634  --   BNP  --   --  510.7* 377.9*  MG  --   --  1.8 1.8  CALCIUM 8.9 9.1 8.7* 8.9    --------------------------------------------------------------------------------------------------------------- Lab Results  Component Value Date   CHOL 92 03/10/2023   HDL 38 (L) 03/10/2023   LDLCALC 38 03/10/2023   LDLDIRECT 160.2 03/01/2010   TRIG 81 03/10/2023   CHOLHDL 2.4 03/10/2023    Lab Results  Component Value Date   HGBA1C 8.4 (H) 02/20/2023   Recent Labs    04/07/23 0607  TSH 0.634  FREET4 0.93   Recent Labs    04/06/23 1055  FERRITIN 10.8  TIBC 394.8  IRON 14*      Signature  -   Susa Raring M.D on 04/08/2023 at 10:24 AM   -  To page go to [email or web address]

## 2023-04-09 ENCOUNTER — Inpatient Hospital Stay (HOSPITAL_COMMUNITY): Payer: PPO | Admitting: Registered Nurse

## 2023-04-09 ENCOUNTER — Encounter (HOSPITAL_COMMUNITY): Payer: PPO

## 2023-04-09 ENCOUNTER — Encounter (HOSPITAL_COMMUNITY): Payer: Self-pay | Admitting: Internal Medicine

## 2023-04-09 ENCOUNTER — Encounter (HOSPITAL_COMMUNITY)
Admission: EM | Disposition: A | Discharge status: Home / Self Care | Payer: Self-pay | Source: Home / Self Care | Attending: Internal Medicine

## 2023-04-09 ENCOUNTER — Ambulatory Visit: Payer: PPO | Admitting: Surgery

## 2023-04-09 DIAGNOSIS — Z7901 Long term (current) use of anticoagulants: Secondary | ICD-10-CM

## 2023-04-09 DIAGNOSIS — K921 Melena: Secondary | ICD-10-CM | POA: Diagnosis not present

## 2023-04-09 DIAGNOSIS — D62 Acute posthemorrhagic anemia: Secondary | ICD-10-CM | POA: Insufficient documentation

## 2023-04-09 DIAGNOSIS — D649 Anemia, unspecified: Secondary | ICD-10-CM | POA: Diagnosis not present

## 2023-04-09 DIAGNOSIS — D509 Iron deficiency anemia, unspecified: Secondary | ICD-10-CM | POA: Diagnosis not present

## 2023-04-09 DIAGNOSIS — I4891 Unspecified atrial fibrillation: Secondary | ICD-10-CM

## 2023-04-09 HISTORY — PX: ENTEROSCOPY: SHX5533

## 2023-04-09 HISTORY — PX: GIVENS CAPSULE STUDY: SHX5432

## 2023-04-09 LAB — CBC WITH DIFFERENTIAL/PLATELET
Abs Immature Granulocytes: 0.03 10*3/uL (ref 0.00–0.07)
Basophils Absolute: 0.1 10*3/uL (ref 0.0–0.1)
Basophils Relative: 1 %
Eosinophils Absolute: 0.2 10*3/uL (ref 0.0–0.5)
Eosinophils Relative: 2 %
HCT: 24.9 % — ABNORMAL LOW (ref 36.0–46.0)
Hemoglobin: 7.6 g/dL — ABNORMAL LOW (ref 12.0–15.0)
Immature Granulocytes: 0 %
Lymphocytes Relative: 28 %
Lymphs Abs: 2.4 10*3/uL (ref 0.7–4.0)
MCH: 25.3 pg — ABNORMAL LOW (ref 26.0–34.0)
MCHC: 30.5 g/dL (ref 30.0–36.0)
MCV: 83 fL (ref 80.0–100.0)
Monocytes Absolute: 0.8 10*3/uL (ref 0.1–1.0)
Monocytes Relative: 9 %
Neutro Abs: 5.2 10*3/uL (ref 1.7–7.7)
Neutrophils Relative %: 60 %
Platelets: 310 10*3/uL (ref 150–400)
RBC: 3 MIL/uL — ABNORMAL LOW (ref 3.87–5.11)
RDW: 16.5 % — ABNORMAL HIGH (ref 11.5–15.5)
WBC: 8.7 10*3/uL (ref 4.0–10.5)
nRBC: 0.2 % (ref 0.0–0.2)

## 2023-04-09 LAB — CBC
HCT: 26 % — ABNORMAL LOW (ref 36.0–46.0)
Hemoglobin: 8.1 g/dL — ABNORMAL LOW (ref 12.0–15.0)
MCH: 25.4 pg — ABNORMAL LOW (ref 26.0–34.0)
MCHC: 31.2 g/dL (ref 30.0–36.0)
MCV: 81.5 fL (ref 80.0–100.0)
Platelets: 262 10*3/uL (ref 150–400)
RBC: 3.19 MIL/uL — ABNORMAL LOW (ref 3.87–5.11)
RDW: 16.3 % — ABNORMAL HIGH (ref 11.5–15.5)
WBC: 8.3 10*3/uL (ref 4.0–10.5)
nRBC: 0.4 % — ABNORMAL HIGH (ref 0.0–0.2)

## 2023-04-09 LAB — BRAIN NATRIURETIC PEPTIDE: B Natriuretic Peptide: 134.5 pg/mL — ABNORMAL HIGH (ref 0.0–100.0)

## 2023-04-09 LAB — GLUCOSE, CAPILLARY
Glucose-Capillary: 106 mg/dL — ABNORMAL HIGH (ref 70–99)
Glucose-Capillary: 116 mg/dL — ABNORMAL HIGH (ref 70–99)
Glucose-Capillary: 126 mg/dL — ABNORMAL HIGH (ref 70–99)
Glucose-Capillary: 183 mg/dL — ABNORMAL HIGH (ref 70–99)
Glucose-Capillary: 184 mg/dL — ABNORMAL HIGH (ref 70–99)
Glucose-Capillary: 192 mg/dL — ABNORMAL HIGH (ref 70–99)

## 2023-04-09 LAB — BASIC METABOLIC PANEL
Anion gap: 8 (ref 5–15)
BUN: 8 mg/dL (ref 8–23)
CO2: 19 mmol/L — ABNORMAL LOW (ref 22–32)
Calcium: 8.5 mg/dL — ABNORMAL LOW (ref 8.9–10.3)
Chloride: 110 mmol/L (ref 98–111)
Creatinine, Ser: 0.88 mg/dL (ref 0.44–1.00)
GFR, Estimated: >60 mL/min (ref >60)
Glucose, Bld: 123 mg/dL — ABNORMAL HIGH (ref 70–99)
Potassium: 3.8 mmol/L (ref 3.5–5.1)
Sodium: 137 mmol/L (ref 135–145)

## 2023-04-09 LAB — MAGNESIUM: Magnesium: 1.8 mg/dL (ref 1.7–2.4)

## 2023-04-09 SURGERY — ENTEROSCOPY
Anesthesia: Monitor Anesthesia Care

## 2023-04-09 MED ORDER — SPOT INK MARKER SYRINGE KIT
PACK | SUBMUCOSAL | Status: AC
  Filled 2023-04-09: qty 5

## 2023-04-09 MED ORDER — PROPOFOL 10 MG/ML IV BOLUS
INTRAVENOUS | Status: DC | PRN
  Administered 2023-04-09: 70 mg via INTRAVENOUS
  Administered 2023-04-09: 20 mg via INTRAVENOUS
  Administered 2023-04-09: 40 mg via INTRAVENOUS
  Administered 2023-04-09: 20 mg via INTRAVENOUS

## 2023-04-09 MED ORDER — PROPOFOL 500 MG/50ML IV EMUL
INTRAVENOUS | Status: DC | PRN
  Administered 2023-04-09: 80 ug/kg/min via INTRAVENOUS

## 2023-04-09 MED ORDER — EPHEDRINE SULFATE-NACL 50-0.9 MG/10ML-% IV SOSY
PREFILLED_SYRINGE | INTRAVENOUS | Status: DC | PRN
  Administered 2023-04-09: 5 mg via INTRAVENOUS

## 2023-04-09 MED ORDER — DILTIAZEM HCL 30 MG PO TABS: 30.0000 mg | ORAL_TABLET | Freq: Once | ORAL | Status: DC

## 2023-04-09 MED ORDER — LIDOCAINE 2% (20 MG/ML) 5 ML SYRINGE
INTRAMUSCULAR | Status: DC | PRN
  Administered 2023-04-09: 80 mg via INTRAVENOUS

## 2023-04-09 MED ORDER — DILTIAZEM HCL 60 MG PO TABS
60.0000 mg | ORAL_TABLET | Freq: Three times a day (TID) | ORAL | Status: DC
  Administered 2023-04-10 (×2): 60 mg via ORAL
  Filled 2023-04-09 (×3): qty 1

## 2023-04-09 MED ORDER — SODIUM CHLORIDE 0.9 % IV SOLN: INTRAVENOUS | Status: DC | PRN

## 2023-04-09 SURGICAL SUPPLY — 14 items

## 2023-04-09 NOTE — Consult Note (Signed)
[facility] Liaison Consult Note    04/09/2023  Theresa Harrington 17-Apr-1956 [id number]  [facility] Patient: 30 day [facility] program  Primary Care Provider:  Etta Grandchild, MD, with [place] [facility], this provider is listed to provide the community transition of care follow up and TOC calls. Met with the patient at the bedside, explained reason for visit. Also, went over the reminder about enrollment for Ozempic application and to follow up if she has concerns.  Insurance: [employer]  Patient is currently active with [facility] for care coordination services.  Patient has been engaged by a [employer].  The community based plan of care has focused on disease management and community resource support.    Patient will receive a post hospital call and will be evaluated for assessments and disease process education.    Plan: Continue to follow for any additional community care coordination needs for post hospital/community needs. Will update Community TOC RN of admission and postential needs.  Of note, [facility] services does not replace or interfere with any services that are needed or arranged by inpatient [facility] care management team.   Charlesetta Shanks, RN, BSN, CCM [place]  [facility], [facility] Liaison Direct Dial: [phone] or secure chat Email: [email or web address]

## 2023-04-09 NOTE — Op Note (Signed)
[facility] Patient Name: Theresa Harrington Procedure Date : 04/09/2023 MRN: [id number] Attending MD: Starr Lake. Myrtie Neither , MD, [id number] Date of Birth: [birth year] CSN: [id number] Age: 67 Admit Type: Inpatient Procedure:                Small bowel enteroscopy Indications:              Acute post hemorrhagic anemia, Melena                           Clinical details in inpatient GI consult note and                            progress note from this morning Providers:                Glory Rosebush, RN, Faustina Mbumina, Technician,                            Starr Lake. Myrtie Neither, MD Referring MD:              Medicines:                Monitored Anesthesia Care Complications:            No immediate complications. Estimated Blood Loss:     Estimated blood loss: none. Procedure:                Pre-Anesthesia Assessment:                           - Prior to the procedure, a History and Physical                            was performed, and patient medications and                            allergies were reviewed. The patient's tolerance of                            previous anesthesia was also reviewed. The risks                            and benefits of the procedure and the sedation                            options and risks were discussed with the patient.                            All questions were answered, and informed consent                            was obtained. Prior Anticoagulants: The patient                            last took Eliquis (apixaban) 3 days and Plavix                            (  clopidogrel) 3 days prior to the procedure. ASA                            Grade Assessment: III - A patient with severe                            systemic disease. After reviewing the risks and                            benefits, the patient was deemed in satisfactory                            condition to undergo the procedure.                           After obtaining informed  consent, the endoscope was                            passed under direct vision. Throughout the                            procedure, the patient's blood pressure, pulse, and                            oxygen saturations were monitored continuously. The                            [id number]-HQ190L ([id number]) Olympus colonoscope was                            introduced through the mouth and advanced to the                            proximal jejunum. The small bowel enteroscopy was                            accomplished without difficulty. The patient                            tolerated the procedure well.                           It should be noted that photodocumentation of the                            video capsule deployed in the small bowel was                            captured, but are saved under a separate EGD                            procedure that was created in the [employer] system. Scope In: Scope Out: Findings:      The esophagus was normal.      The  patient has had a LapBand placement, with resulting extrinsic       compression on the gastric cardia. Minimal resistance passing the scope       through here.      The exam of the stomach was otherwise normal.      There was no evidence of significant pathology in the entire examined       portion of jejunum.      Normal mucosa was found in the entire duodenum other than mild       nonbleeding scope related mucosal trauma in the duodenal sweep.      After completing the enteroscopy with no source of bleeding found, the       scope was changed for the upper endoscope ( #356) with the video capsule       deployment device attached. The scope was then introduced to the mouth       and carefully advanced through the pharynx into the esophagus and into       the stomach. There was moderate resistance passing it through the area       of the Lap-Band compression, resulting in minimal mucosal trauma. Some       additional  scattered nonbleeding mucosal trauma was seen in the stomach       from the enteroscopy. Using the endoscope, the video capsule enteroscope       was advanced into the duodenal bulb. The scope was then withdrawn and       the deployment device removed. The upper endoscope was then reintroduced       to the mouth and advanced to the duodenal bulb, where the video capsule       study was seen. The capsule was then gently pushed into the third       portion of the duodenum, and the endoscope then withdrawn. Impression:               - Normal esophagus.                           Stomach was normal except for the known extrinsic                            compression on the gastric cardia from the                            patient's Lap-Band device.                           - The examined portion of the jejunum was normal.                           - Normal mucosa was found in the entire examined                            duodenum.                           - Successful completion of the Video Capsule                            Enteroscope placement.                           -  No specimens collected. Recommendation:           - Return patient to hospital ward for ongoing care.                           - Post video capsule diet orders written                           Video capsule study will be reviewed tomorrow with                            further recommendations.                           Hemoglobin and hematocrit this evening and CBC                            tomorrow morning.                           Hold anticoagulants and antiplatelet agents for now. Procedure Code(s):        --- Professional ---                           [id number], Small intestinal endoscopy, enteroscopy                            beyond second portion of duodenum, not including                            ileum; diagnostic, including collection of                            specimen(s) by brushing or washing, when  performed                            (separate procedure)                           43235, 59, Esophagogastroduodenoscopy, flexible,                            transoral; diagnostic, including collection of                            specimen(s) by brushing or washing, when performed                            (separate procedure) Diagnosis Code(s):        --- Professional ---                           D62, Acute posthemorrhagic anemia                           K92.1, Melena (includes Hematochezia) CPT copyright 2022 American Medical Association. All rights reserved. The codes documented in this report are  preliminary and upon coder review may  be revised to meet current compliance requirements. Maye Parkinson L. Myrtie Neither, MD 04/09/2023 10:04:35 AM This report has been signed electronically. Number of Addenda: 0

## 2023-04-09 NOTE — Progress Notes (Signed)
Signout received from Dr. Adela Lank and additional chart review performed.  Patient well-known to me from the outpatient setting and most recent endoscopic procedures in April 2024.  She says her black tarry stool has stopped in the last day or so, she was concerned that her hemoglobin had dropped from 9 - 7.4 despite that.  I discussed with her the possibility of some ongoing bleeding, albeit perhaps to a lesser degree than upon admission, and also equilibration after transfusion.  Her consent form was for an upper endoscopy, though signout suggested the need for small bowel enteroscopy.  I agree that with her overall clinical picture, small bowel enteroscopy is warranted.  In addition, if there is no convincing source of bleeding found on that study, she should have a small bowel video capsule study. From having done her last endoscopy in April of this year, I know that she has a Lap-Band which had some pill and/or food fragments just above that constriction in the gastric cardia.  This gives me concerned that she may not be able to pass a small bowel video capsule study through there, meaning it could become lodged in the gastric cardia/EG junction which would be both uncomfortable and risky.  As such, I recommended that if she needs the small bowel video capsule study because no bleeding source is found on enteroscopy, that we do that show with endoscopic delivery while she is still under sedation for the enteroscopy.  She was agreeable, risk and benefits were reviewed, and consent form was updated with initials by both of [place].  This conversation occurred in the presence of her endoscopy nurse in the preprocedure exam room.  Her A-fib is now under control with assistance from the internal medicine team, and we are ready to proceed.   Amada Jupiter, MD    [facility]

## 2023-04-09 NOTE — Transfer of Care (Signed)
Immediate Anesthesia Transfer of Care Note  Patient: Theresa Harrington  Procedure(s) Performed: ENTEROSCOPY GIVENS CAPSULE STUDY  Patient Location: PACU and Endoscopy Unit  Anesthesia Type:MAC  Level of Consciousness: drowsy  Airway & Oxygen Therapy: Patient Spontanous Breathing and Patient connected to face mask oxygen  Post-op Assessment: Report given to RN and Post -op Vital signs reviewed and stable  Post vital signs: Reviewed and stable  Last Vitals:  Vitals Value Taken Time  BP 104/46 04/09/23 0943  Temp    Pulse 51 04/09/23 0945  Resp 22 04/09/23 0945  SpO2 100 % 04/09/23 0945  Vitals shown include unfiled device data.  Last Pain:  Vitals:   04/09/23 0833  TempSrc: Temporal  PainSc: 0-No pain         Complications: No notable events documented.

## 2023-04-09 NOTE — Interval H&P Note (Signed)
History and Physical Interval Note:  04/09/2023 9:07 AM  Theresa Harrington  has presented today for surgery, with the diagnosis of Anemia, melena.  The various methods of treatment have been discussed with the patient and family. After consideration of risks, benefits and other options for treatment, the patient has consented to  Procedure(s): ENTEROSCOPY (N/A) as a surgical intervention.  The patient's history has been reviewed, patient examined, no change in status, stable for surgery.  I have reviewed the patient's chart and labs.  Questions were answered to the patient's satisfaction.     Charlie Pitter III

## 2023-04-09 NOTE — Plan of Care (Addendum)
Pt has rested quietly throughout the night with no distress noted. Alert and oriented. On room air. SB on the monitor. Up to BR independently. NPO after MN for procedure in the am. No complaints voiced.     Problem: Pain Management: Goal: General experience of comfort will improve Outcome: Progressing   Problem: Bowel/Gastric: Goal: Will show no signs and symptoms of gastrointestinal bleeding Outcome: Progressing   Problem: Clinical Measurements: Goal: Complications related to the disease process, condition or treatment will be avoided or minimized Outcome: Progressing

## 2023-04-09 NOTE — H&P (View-Only) (Signed)
Signout received from Dr. Adela Lank and additional chart review performed.  Patient well-known to me from the outpatient setting and most recent endoscopic procedures in April 2024.  She says her black tarry stool has stopped in the last day or so, she was concerned that her hemoglobin had dropped from 9 - 7.4 despite that.  I discussed with her the possibility of some ongoing bleeding, albeit perhaps to a lesser degree than upon admission, and also equilibration after transfusion.  Her consent form was for an upper endoscopy, though signout suggested the need for small bowel enteroscopy.  I agree that with her overall clinical picture, small bowel enteroscopy is warranted.  In addition, if there is no convincing source of bleeding found on that study, she should have a small bowel video capsule study. From having done her last endoscopy in April of this year, I know that she has a Lap-Band which had some pill and/or food fragments just above that constriction in the gastric cardia.  This gives me concerned that she may not be able to pass a small bowel video capsule study through there, meaning it could become lodged in the gastric cardia/EG junction which would be both uncomfortable and risky.  As such, I recommended that if she needs the small bowel video capsule study because no bleeding source is found on enteroscopy, that we do that show with endoscopic delivery while she is still under sedation for the enteroscopy.  She was agreeable, risk and benefits were reviewed, and consent form was updated with initials by both of [place].  This conversation occurred in the presence of her endoscopy nurse in the preprocedure exam room.  Her A-fib is now under control with assistance from the internal medicine team, and we are ready to proceed.   Amada Jupiter, MD    [facility]

## 2023-04-09 NOTE — Progress Notes (Signed)
PROGRESS NOTE                                                                                                                                                                                                             Patient Demographics:    Theresa Harrington, is a 67 y.o. female, DOB - March 20, 1956, NGE:[id number]  Outpatient Primary MD for the patient is Etta Grandchild, MD    LOS - 2  Admit date - 04/06/2023    Chief Complaint  Patient presents with   Post-op Problem       Brief Narrative (HPI from H&P)    67 y.o. female with medical history significant of hypertension, hyperlipidemia, iron deficiency anemia, PAD, GERD, Barrett's esophagus, atrial fibrillation, diabetes, CKD 2, status post lap band, spinal stenosis, depression presenting with abnormal lab and symptomatic anemia.   Patient underwent femoropopliteal bypass on November 8 and has been doing well from that standpoint after the procedure.  She did, however, noticed some increasing fatigue and increasing dyspnea on exertion for the past week or so.  She was at follow-up visit with her PCP today and had lab work drawn which showed hemoglobin of 6.4 down from previous of 8-9.  She was sent to the ED for further evaluation. She does have intermittent dark stools for few days.  In the ER she was diagnosed with severe anemia and admitted for further workup.   Subjective:   Patient in bed, appears comfortable, denies any headache, no fever, no chest pain or pressure, no shortness of breath , no abdominal pain. No new focal weakness.   Assessment  & Plan :   Symptomatic iron deficiency anemia likely due to intermittent occult GI blood loss versus subacute intermittent upper GI bleed. She was on combination of aspirin, Plavix and Xarelto, all held.  Received 1 unit of packed RBC, continue to monitor CBC, no ongoing brisk bleeding, IV PPI, underwent EGD on 04/09/2023 which appears  to be unremarkable, capsule endoscopy commenced on 04/09/2023 reported to be up on 04/10/2023.  Per GI continue to monitor CBC and hold off on DAPT and anticoagulation for now.  Paroxysmal A-fib.  [place] vas 2 score of greater than 3.  Currently in RVR.  Stable TSH and free T4, RVR likely due to the stress of anemia, on oral Cardizem and Lopressor, dose  adjusted, free on IV Cardizem drip, also 2 doses of IV digoxin, RVR better, currently in sinus with rate control on oral Cardizem along with as needed IV Ativan and Lopressor.  Monitor on telemetry.  PAD recent surgery for femoral-tibial bypass graft occlusion on the right side.  Recent femoropopliteal bypass.  Aspirin Plavix and Xarelto held due to #1 above, continue statin and Zetia for secondary prevention.  Will inform vascular surgery as well.  Hypertension.  Continue beta-blocker and Cardizem as above.  GERD with history of Barrett's esophagus.  Currently IV PPI.  CKD stage II.  Stable.  DM type II.  Continue long-acting insulin along with sliding scale.  Monitor and adjust.  Lab Results  Component Value Date   HGBA1C 8.4 (H) 02/20/2023   CBG (last 3)  Recent Labs    04/08/23 2334 04/09/23 0423 04/09/23 0836  GLUCAP 157* 126* 106*        Condition - Guarded  Family Communication  : None present  Code Status :  Full  Consults  :  GI, VVS  PUD Prophylaxis : PPI   Procedures  :     EGD -  Impression:               - Normal esophagus.                           Stomach was normal except for the known extrinsic                            compression on the gastric cardia from the                            patient's Lap-Band device.                           - The examined portion of the jejunum was normal.                           - Normal mucosa was found in the entire examined                            duodenum.                           - Successful completion of the Video Capsule                             Enteroscope placement.                           - No specimens collected. Recommendation:           - Return patient to hospital ward for ongoing care.                           - Post video capsule diet orders written                           Video capsule study will be reviewed tomorrow with  further recommendations.                           Hemoglobin and hematocrit this evening and CBC                            tomorrow morning.                           Hold anticoagulants and antiplatelet agents for now.      Disposition Plan  :    Status is: Observation  DVT Prophylaxis  :    SCDs Start: 04/06/23 1845    Lab Results  Component Value Date   PLT 310 04/09/2023    Diet :  Diet Order             Diet NPO time specified Except for: Sips with Meds  Diet effective midnight                    Inpatient Medications  Scheduled Meds:  sodium chloride   Intravenous Once   diltiazem  30 mg Oral Once   diltiazem  60 mg Oral Q8H   dorzolamide-timolol  1 drop Right Eye BID   ezetimibe  10 mg Oral Daily   insulin aspart  0-15 Units Subcutaneous Q4H   insulin glargine-yfgn  25 Units Subcutaneous QHS   latanoprost  1 drop Both Eyes QHS   pantoprazole (PROTONIX) IV  40 mg Intravenous Q12H   rosuvastatin  5 mg Oral Daily   sodium chloride flush  3 mL Intravenous Q12H   Continuous Infusions:   PRN Meds:.acetaminophen **OR** acetaminophen, diltiazem, metoprolol tartrate, polyethylene glycol  Antibiotics  :    Anti-infectives (From admission, onward)    None         Objective:   Vitals:   04/09/23 1000 04/09/23 1010 04/09/23 1014 04/09/23 1029  BP: (!) 114/38 (!) 129/55  128/62  Pulse: (!) 53 (!) 53 (!) 53 (!) 57  Resp: (!) 24 (!) 22 14 18   Temp:    (!) 97.5 F (36.4 C)  TempSrc:    Oral  SpO2: 100% 100% 100% 100%  Weight:      Height:        Wt Readings from Last 3 Encounters:  04/09/23 76 kg  04/06/23 74.8 kg   04/06/23 75.8 kg     Intake/Output Summary (Last 24 hours) at 04/09/2023 1056 Last data filed at 04/09/2023 0937 Gross per 24 hour  Intake 1897.6 ml  Output --  Net 1897.6 ml     Physical Exam  Awake Alert, No new F.N deficits, Normal affect [place].AT,PERRAL Supple Neck, No JVD,   Symmetrical Chest wall movement, Good air movement bilaterally, CTAB RRR,No Gallops,Rubs or new Murmurs,  +ve B.Sounds, Abd Soft, No tenderness,   No Cyanosis, Clubbing or edema         Data Review:    Recent Labs  Lab 04/06/23 1055 04/06/23 1514 04/07/23 0607 04/08/23 0510 04/09/23 0443  WBC 11.6* 9.7 9.0 8.2 8.7  HGB 6.4 Repeated and verified X2.* 6.3* 8.0* 9.0* 7.6*  HCT 21.3 Repeated and verified X2.* 22.2* 25.6* 28.9* 24.9*  PLT 348.0 365 325 339 310  MCV 79.5 83.5 81.3 81.6 83.0  MCH  --  23.7* 25.4* 25.4* 25.3*  MCHC 30.3 28.4* 31.3 31.1 30.5  RDW 17.8* 17.4* 16.2* 16.2* 16.5*  LYMPHSABS 2.7 1.9  --  2.3 2.4  MONOABS 0.8 0.6  --  0.8 0.8  EOSABS 0.1 0.1  --  0.2 0.2  BASOSABS 0.1 0.1  --  0.1 0.1    Recent Labs  Lab 04/06/23 1055 04/06/23 1514 04/07/23 0607 04/08/23 0510 04/09/23 0443  NA 141 141 139 139 137  K 3.8 4.6 4.3 3.7 3.8  CL 108 112* 110 112* 110  CO2 22 19* 21* 19* 19*  ANIONGAP  --  10 8 8 8   GLUCOSE 202* 239* 114* 138* 123*  BUN 14 14 11 8 8   CREATININE 0.90 1.05* 0.90 0.92 0.88  AST 8 14* 14*  --   --   ALT 8 11 11   --   --   ALKPHOS 62 52 50  --   --   BILITOT 0.3 0.4 0.8  --   --   ALBUMIN 3.8 3.1* 3.0*  --   --   TSH 0.56  --  0.634  --   --   BNP  --   --  510.7* 377.9* 134.5*  MG  --   --  1.8 1.8 1.8  CALCIUM 8.9 9.1 8.7* 8.9 8.5*      Recent Labs  Lab 04/06/23 1055 04/06/23 1514 04/07/23 0607 04/08/23 0510 04/09/23 0443  TSH 0.56  --  0.634  --   --   BNP  --   --  510.7* 377.9* 134.5*  MG  --   --  1.8 1.8 1.8  CALCIUM 8.9 9.1 8.7* 8.9 8.5*     --------------------------------------------------------------------------------------------------------------- Lab Results  Component Value Date   CHOL 92 03/10/2023   HDL 38 (L) 03/10/2023   LDLCALC 38 03/10/2023   LDLDIRECT 160.2 03/01/2010   TRIG 81 03/10/2023   CHOLHDL 2.4 03/10/2023    Lab Results  Component Value Date   HGBA1C 8.4 (H) 02/20/2023   Recent Labs    04/07/23 0607  TSH 0.634  FREET4 0.93   No results for input(s): "VITAMINB12", "FOLATE", "FERRITIN", "TIBC", "IRON", "RETICCTPCT" in the last 72 hours.     Signature  -   Susa Raring M.D on 04/09/2023 at 10:56 AM   -  To page go to [email or web address]

## 2023-04-09 NOTE — Anesthesia Postprocedure Evaluation (Signed)
Anesthesia Post Note  Patient: Theresa Harrington  Procedure(s) Performed: ENTEROSCOPY GIVENS CAPSULE STUDY     Patient location during evaluation: PACU Anesthesia Type: MAC Level of consciousness: awake and alert Pain management: pain level controlled Vital Signs Assessment: post-procedure vital signs reviewed and stable Respiratory status: spontaneous breathing, nonlabored ventilation and respiratory function stable Cardiovascular status: blood pressure returned to baseline Postop Assessment: no apparent nausea or vomiting Anesthetic complications: no   No notable events documented.              Shanda Howells

## 2023-04-09 NOTE — Progress Notes (Signed)
Patient received from endoscopy alert and oriented.Vitals stable.Call bell on reach.

## 2023-04-10 ENCOUNTER — Encounter (HOSPITAL_COMMUNITY): Payer: Self-pay | Admitting: Gastroenterology

## 2023-04-10 DIAGNOSIS — K921 Melena: Secondary | ICD-10-CM | POA: Diagnosis not present

## 2023-04-10 DIAGNOSIS — D509 Iron deficiency anemia, unspecified: Secondary | ICD-10-CM | POA: Diagnosis not present

## 2023-04-10 DIAGNOSIS — D62 Acute posthemorrhagic anemia: Secondary | ICD-10-CM | POA: Diagnosis not present

## 2023-04-10 DIAGNOSIS — K922 Gastrointestinal hemorrhage, unspecified: Secondary | ICD-10-CM | POA: Diagnosis not present

## 2023-04-10 DIAGNOSIS — Z7901 Long term (current) use of anticoagulants: Secondary | ICD-10-CM | POA: Diagnosis not present

## 2023-04-10 LAB — BASIC METABOLIC PANEL
Anion gap: 7 (ref 5–15)
BUN: 11 mg/dL (ref 8–23)
CO2: 20 mmol/L — ABNORMAL LOW (ref 22–32)
Calcium: 8.6 mg/dL — ABNORMAL LOW (ref 8.9–10.3)
Chloride: 111 mmol/L (ref 98–111)
Creatinine, Ser: 0.87 mg/dL (ref 0.44–1.00)
GFR, Estimated: >60 mL/min (ref >60)
Glucose, Bld: 138 mg/dL — ABNORMAL HIGH (ref 70–99)
Potassium: 3.8 mmol/L (ref 3.5–5.1)
Sodium: 138 mmol/L (ref 135–145)

## 2023-04-10 LAB — CBC
HCT: 28.1 % — ABNORMAL LOW (ref 36.0–46.0)
Hemoglobin: 8.9 g/dL — ABNORMAL LOW (ref 12.0–15.0)
MCH: 25.6 pg — ABNORMAL LOW (ref 26.0–34.0)
MCHC: 31.7 g/dL (ref 30.0–36.0)
MCV: 80.7 fL (ref 80.0–100.0)
Platelets: 320 10*3/uL (ref 150–400)
RBC: 3.48 MIL/uL — ABNORMAL LOW (ref 3.87–5.11)
RDW: 15.9 % — ABNORMAL HIGH (ref 11.5–15.5)
WBC: 8.7 10*3/uL (ref 4.0–10.5)
nRBC: 0 % (ref 0.0–0.2)

## 2023-04-10 LAB — GLUCOSE, CAPILLARY
Glucose-Capillary: 135 mg/dL — ABNORMAL HIGH (ref 70–99)
Glucose-Capillary: 127 mg/dL — ABNORMAL HIGH (ref 70–99)
Glucose-Capillary: 205 mg/dL — ABNORMAL HIGH (ref 70–99)
Glucose-Capillary: 190 mg/dL — ABNORMAL HIGH (ref 70–99)
Glucose-Capillary: 121 mg/dL — ABNORMAL HIGH (ref 70–99)

## 2023-04-10 LAB — CBC WITH DIFFERENTIAL/PLATELET
Abs Immature Granulocytes: 0.02 10*3/uL (ref 0.00–0.07)
Basophils Absolute: 0 10*3/uL (ref 0.0–0.1)
Basophils Relative: 1 %
Eosinophils Absolute: 0.2 10*3/uL (ref 0.0–0.5)
Eosinophils Relative: 2 %
HCT: 22.8 % — ABNORMAL LOW (ref 36.0–46.0)
Hemoglobin: 7.1 g/dL — ABNORMAL LOW (ref 12.0–15.0)
Immature Granulocytes: 0 %
Lymphocytes Relative: 23 %
Lymphs Abs: 1.9 10*3/uL (ref 0.7–4.0)
MCH: 25.3 pg — ABNORMAL LOW (ref 26.0–34.0)
MCHC: 31.1 g/dL (ref 30.0–36.0)
MCV: 81.1 fL (ref 80.0–100.0)
Monocytes Absolute: 0.7 10*3/uL (ref 0.1–1.0)
Monocytes Relative: 8 %
Neutro Abs: 5.5 10*3/uL (ref 1.7–7.7)
Neutrophils Relative %: 66 %
Platelets: 284 10*3/uL (ref 150–400)
RBC: 2.81 MIL/uL — ABNORMAL LOW (ref 3.87–5.11)
RDW: 16.1 % — ABNORMAL HIGH (ref 11.5–15.5)
WBC: 8.3 10*3/uL (ref 4.0–10.5)
nRBC: 0.2 % (ref 0.0–0.2)

## 2023-04-10 LAB — BRAIN NATRIURETIC PEPTIDE: B Natriuretic Peptide: 97.6 pg/mL (ref 0.0–100.0)

## 2023-04-10 LAB — MAGNESIUM: Magnesium: 1.8 mg/dL (ref 1.7–2.4)

## 2023-04-10 LAB — PREPARE RBC (CROSSMATCH)

## 2023-04-10 MED ORDER — PEG 3350-KCL-NA BICARB-NACL 420 G PO SOLR
4000.0000 mL | Freq: Once | ORAL | Status: AC
  Administered 2023-04-10: 4000 mL via ORAL
  Filled 2023-04-10: qty 4000

## 2023-04-10 MED ORDER — SODIUM CHLORIDE 0.9% IV SOLUTION
Freq: Once | INTRAVENOUS | Status: AC
Start: 2023-04-10 — End: 2023-04-10

## 2023-04-10 MED ORDER — INSULIN ASPART 100 UNIT/ML IJ SOLN
0.0000 [IU] | Freq: Three times a day (TID) | INTRAMUSCULAR | Status: DC
  Administered 2023-04-10: 2 [IU] via SUBCUTANEOUS
  Administered 2023-04-11: 5 [IU] via SUBCUTANEOUS
  Administered 2023-04-12: 3 [IU] via SUBCUTANEOUS
  Administered 2023-04-12 (×2): 5 [IU] via SUBCUTANEOUS
  Administered 2023-04-12: 2 [IU] via SUBCUTANEOUS

## 2023-04-10 NOTE — H&P (View-Only) (Signed)
Progress Note   Subjective  Chief Complaint: Melena  This morning patient tells me she has had no further melena.  She is nervous in regards to the fact that all of her blood thinners have been hold since last Friday and she is worried about developing a clot and having a stroke given her recent vascular surgery.  She is frustrated that we cannot find the exact source of bleeding but understanding.   Objective   Vital signs in last 24 hours: Temp:  [97.9 F (36.6 C)-98.5 F (36.9 C)] 97.9 F (36.6 C) (12/10 1200) Pulse Rate:  [58-66] 66 (12/10 1200) Resp:  [14-25] 18 (12/10 1200) BP: (128-141)/(44-58) 135/58 (12/10 1200) SpO2:  [98 %-100 %] 100 % (12/10 1200) Last BM Date : 04/09/23 General: Female in NAD Heart:  Regular rate and rhythm; no murmurs Lungs: Respirations even and unlabored, lungs CTA bilaterally Abdomen:  Soft, nontender and nondistended. Normal bowel sounds. Psych:  Cooperative. Normal mood and affect.  Intake/Output from previous day: 12/09 0701 - 12/10 0700 In: 680 [P.O.:480; I.V.:200] Out: -   Lab Results: Recent Labs    04/09/23 0443 04/09/23 1201 04/10/23 0429  WBC 8.7 8.3 8.3  HGB 7.6* 8.1* 7.1*  HCT 24.9* 26.0* 22.8*  PLT 310 262 284   BMET Recent Labs    04/08/23 0510 04/09/23 0443 04/10/23 0429  NA 139 137 138  K 3.7 3.8 3.8  CL 112* 110 111  CO2 19* 19* 20*  GLUCOSE 138* 123* 138*  BUN 8 8 11   CREATININE 0.92 0.88 0.87  CALCIUM 8.9 8.5* 8.6*     Assessment / Plan:   Assessment: 1.  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  Symptomatic iron deficiency anemia 3.  Chronic anticoagulation for A-fib and recent vascular surgery on Plavix and Xarelto as well as aspirin  Plan: 1.  Discussed with patient that recent capsule endoscopy was unrevealing.  Most likely she has an AVM in her distal small bowel that would be unable  to be reached with therapy.  At this point we will try to minimize her anticoagulation as able.  Recommend discussion with vascular and cardiology in regards to this. 2.  Continue to monitor hemoglobin with transfusion as needed less than 7, patient is getting 1 unit PRBCs today. 3.  Continue Pantoprazole 40 mg p.o. twice daily for now 4.  Patient would benefit from referral to hematology as an outpatient so they can keep an eye on her hemoglobin and give her transfusions as needed in the future.  I discussed this with the patient today.  Thank you for your kind consultation.    LOS: 3 days   Unk Lightning  04/10/2023, 12:13 PM  I have taken an interval history, thoroughly reviewed the chart and examined the patient. I agree with the Advanced Practitioner's note, impression and recommendations, and have recorded additional findings, impressions and recommendations below. I performed a substantive portion of this encounter (>50% time spent), including a complete performance of the medical decision making.  My additional thoughts are as follows: Some additional thoughts on the change to the above plan   Bleeding source unknown, though seems most likely to been an [place] or small bowel source such as an AVM given the known limitations of video capsule study and the overall clinical picture.  There were no bleeding sources seen on a routine polyp surveillance  colonoscopy with me in April of this year, so I think it is unlikely but still possible something in the proximal colon could have developed to cause this bleeding in the interim.  (Ulcer, AVM)  I recommended she undergo a colonoscopy with me tomorrow after bowel preparation this evening.  She was agreeable after discussion of procedure and risks as well as rationale for this testing.  The benefits and risks of the planned procedure were described in detail with the patient or (when appropriate) their health care proxy.  Risks were  outlined as including, but not limited to, bleeding, infection, perforation, adverse medication reaction leading to cardiac or pulmonary decompensation, pancreatitis (if ERCP).  The limitation of incomplete mucosal visualization was also discussed.  No guarantees or warranties were given. Patient at increased risk for cardiopulmonary complications of procedure due to medical comorbidities.   We are trying to make challenging decisions about multiple antiplatelet and anticoagulant medicines when we do not know the source of bleeding.  If the colonoscopy has no clear source of recent blood loss, then my conclusion will be it was an obscure small bowel source that we cannot find on capsule study and therefore cannot take intervention on.  The bleeding has clearly stopped, but she is unquestionably at an unknown amount of risk for rebleeding when she goes back on her admission medicines.  I think her oral anticoagulant must be resumed given her underlying A-fib and risk of stroke.  1 or both of her antiplatelet agents should most likely be resumed, and we will need to ask Dr. Myra Gianotti of vascular surgery to weigh in on what they recommend.   40 minutes were spent on this encounter (including chart review, history/exam, counseling/coordination of care, and documentation) > 50% of that time was spent on counseling and coordination of care.    Charlie Pitter III [phone]

## 2023-04-10 NOTE — Progress Notes (Signed)
PROGRESS NOTE                                                                                                                                                                                                             Patient Demographics:    Theresa Harrington, is a 67 y.o. female, DOB - 1956-04-20, BMW:[id number]  Outpatient Primary MD for the patient is Etta Grandchild, MD    LOS - 3  Admit date - 04/06/2023    Chief Complaint  Patient presents with   Post-op Problem       Brief Narrative (HPI from H&P)    67 y.o. female with medical history significant of hypertension, hyperlipidemia, iron deficiency anemia, PAD, GERD, Barrett's esophagus, atrial fibrillation, diabetes, CKD 2, status post lap band, spinal stenosis, depression presenting with abnormal lab and symptomatic anemia.   Patient underwent femoropopliteal bypass on November 8 and has been doing well from that standpoint after the procedure.  She did, however, noticed some increasing fatigue and increasing dyspnea on exertion for the past week or so.  She was at follow-up visit with her PCP today and had lab work drawn which showed hemoglobin of 6.4 down from previous of 8-9.  She was sent to the ED for further evaluation. She does have intermittent dark stools for few days.  In the ER she was diagnosed with severe anemia and admitted for further workup.   Subjective:   Patient in bed, appears comfortable, denies any headache, no fever, no chest pain or pressure, no shortness of breath , no abdominal pain. No focal weakness.  No melena or reported bleeding.   Assessment  & Plan :   Symptomatic iron deficiency anemia likely due to intermittent occult GI blood loss versus subacute intermittent upper GI bleed. She was on combination of aspirin, Plavix and Xarelto, all held.  Received 1 unit of packed RBC, continue to monitor CBC, no ongoing brisk bleeding, IV PPI, underwent  EGD on 04/09/2023 which appears to be unremarkable, capsule endoscopy commenced on 04/09/2023 reported to be up on 04/10/2023.  Per GI continue to monitor CBC and hold off on DAPT and anticoagulation for now.    Her hemoglobin has dropped a little bit on 04/10/2023 could be hemodilution or simply lab error however since it has come close to 7 will give her another  unit of packed RBC on 04/10/2023, await capsule endoscopy results.  Continue to monitor CBC today.  Continue to hold DAPT and anticoagulation.  Paroxysmal A-fib.  [place] vas 2 score of greater than 3.  Currently in RVR.  Stable TSH and free T4, RVR likely due to the stress of anemia, on oral Cardizem and Lopressor, dose adjusted, free on IV Cardizem drip, also 2 doses of IV digoxin, RVR better, currently in sinus with rate control on oral Cardizem along with as needed IV Ativan and Lopressor.  Monitor on telemetry.  PAD recent surgery for femoral-tibial bypass graft occlusion on the right side.  Recent femoropopliteal bypass.  Aspirin Plavix and Xarelto held due to #1 above, continue statin and Zetia for secondary prevention.  Will inform vascular surgery as well.  Hypertension.  Continue beta-blocker and Cardizem as above.  GERD with history of Barrett's esophagus.   PPI  CKD stage II.  Stable.  DM type II.  Continue long-acting insulin along with sliding scale.  Monitor and adjust.  Lab Results  Component Value Date   HGBA1C 8.4 (H) 02/20/2023   CBG (last 3)  Recent Labs    04/09/23 2348 04/10/23 0347 04/10/23 0819  GLUCAP 192* 135* 127*        Condition - Guarded  Family Communication  : None present  Code Status :  Full  Consults  :  GI, VVS  PUD Prophylaxis : PPI   Procedures  :     Capsule endoscopy started 04/09/2023   EGD -04/09/2023  Impression:               - Normal esophagus.                           Stomach was normal except for the known extrinsic                            compression on the  gastric cardia from the                            patient's Lap-Band device.                           - The examined portion of the jejunum was normal.                           - Normal mucosa was found in the entire examined                            duodenum.                           - Successful completion of the Video Capsule                            Enteroscope placement.                           - No specimens collected. Recommendation:           - Return patient to hospital ward for ongoing care.                           -  Post video capsule diet orders written                           Video capsule study will be reviewed tomorrow with                            further recommendations.                           Hemoglobin and hematocrit this evening and CBC                            tomorrow morning.                           Hold anticoagulants and antiplatelet agents for now.      Disposition Plan  :    Status is: Observation  DVT Prophylaxis  :    SCDs Start: 04/06/23 1845    Lab Results  Component Value Date   PLT 284 04/10/2023    Diet :  Diet Order             Diet Heart Fluid consistency: Thin  Diet effective now                    Inpatient Medications  Scheduled Meds:  sodium chloride   Intravenous Once   sodium chloride   Intravenous Once   diltiazem  30 mg Oral Once   diltiazem  60 mg Oral Q8H   dorzolamide-timolol  1 drop Right Eye BID   ezetimibe  10 mg Oral Daily   insulin aspart  0-15 Units Subcutaneous Q4H   insulin glargine-yfgn  25 Units Subcutaneous QHS   latanoprost  1 drop Both Eyes QHS   pantoprazole (PROTONIX) IV  40 mg Intravenous Q12H   rosuvastatin  5 mg Oral Daily   sodium chloride flush  3 mL Intravenous Q12H   Continuous Infusions:   PRN Meds:.acetaminophen **OR** acetaminophen, diltiazem, metoprolol tartrate, polyethylene glycol  Antibiotics  :    Anti-infectives (From admission, onward)    None          Objective:   Vitals:   04/09/23 1602 04/09/23 2125 04/10/23 0605 04/10/23 0818  BP: (!) 136/57 (!) 128/49 (!) 141/44 (!) 131/57  Pulse: (!) 59 (!) 59 (!) 59 (!) 58  Resp: 16 (!) 24 (!) 25 14  Temp: 97.9 F (36.6 C) 98.5 F (36.9 C) 98 F (36.7 C) 98.1 F (36.7 C)  TempSrc: Oral Oral Oral Oral  SpO2: 100% 98% 98%   Weight:      Height:        Wt Readings from Last 3 Encounters:  04/09/23 76 kg  04/06/23 74.8 kg  04/06/23 75.8 kg     Intake/Output Summary (Last 24 hours) at 04/10/2023 0830 Last data filed at 04/09/2023 2100 Gross per 24 hour  Intake 680 ml  Output --  Net 680 ml     Physical Exam  Awake Alert, No new F.N deficits, Normal affect [place].AT,PERRAL Supple Neck, No JVD,   Symmetrical Chest wall movement, Good air movement bilaterally, CTAB RRR,No Gallops,Rubs or new Murmurs,  +ve B.Sounds, Abd Soft, No tenderness,   No Cyanosis, Clubbing or edema         Data Review:  Recent Labs  Lab 04/06/23 1055 04/06/23 1055 04/06/23 1514 04/07/23 0607 04/08/23 0510 04/09/23 0443 04/09/23 1201 04/10/23 0429  WBC 11.6*  --  9.7 9.0 8.2 8.7 8.3 8.3  HGB 6.4 Repeated and verified X2.*  --  6.3* 8.0* 9.0* 7.6* 8.1* 7.1*  HCT 21.3 Repeated and verified X2.*  --  22.2* 25.6* 28.9* 24.9* 26.0* 22.8*  PLT 348.0  --  365 325 339 310 262 284  MCV 79.5  --  83.5 81.3 81.6 83.0 81.5 81.1  MCH  --    < > 23.7* 25.4* 25.4* 25.3* 25.4* 25.3*  MCHC 30.3  --  28.4* 31.3 31.1 30.5 31.2 31.1  RDW 17.8*  --  17.4* 16.2* 16.2* 16.5* 16.3* 16.1*  LYMPHSABS 2.7  --  1.9  --  2.3 2.4  --  1.9  MONOABS 0.8  --  0.6  --  0.8 0.8  --  0.7  EOSABS 0.1  --  0.1  --  0.2 0.2  --  0.2  BASOSABS 0.1  --  0.1  --  0.1 0.1  --  0.0   < > = values in this interval not displayed.    Recent Labs  Lab 04/06/23 1055 04/06/23 1514 04/07/23 0607 04/08/23 0510 04/09/23 0443 04/10/23 0429  NA 141 141 139 139 137 138  K 3.8 4.6 4.3 3.7 3.8 3.8  CL 108 112* 110 112* 110  111  CO2 22 19* 21* 19* 19* 20*  ANIONGAP  --  10 8 8 8 7   GLUCOSE 202* 239* 114* 138* 123* 138*  BUN 14 14 11 8 8 11   CREATININE 0.90 1.05* 0.90 0.92 0.88 0.87  AST 8 14* 14*  --   --   --   ALT 8 11 11   --   --   --   ALKPHOS 62 52 50  --   --   --   BILITOT 0.3 0.4 0.8  --   --   --   ALBUMIN 3.8 3.1* 3.0*  --   --   --   TSH 0.56  --  0.634  --   --   --   BNP  --   --  510.7* 377.9* 134.5* 97.6  MG  --   --  1.8 1.8 1.8 1.8  CALCIUM 8.9 9.1 8.7* 8.9 8.5* 8.6*      Recent Labs  Lab 04/06/23 1055 04/06/23 1514 04/07/23 0607 04/08/23 0510 04/09/23 0443 04/10/23 0429  TSH 0.56  --  0.634  --   --   --   BNP  --   --  510.7* 377.9* 134.5* 97.6  MG  --   --  1.8 1.8 1.8 1.8  CALCIUM 8.9 9.1 8.7* 8.9 8.5* 8.6*    --------------------------------------------------------------------------------------------------------------- Lab Results  Component Value Date   CHOL 92 03/10/2023   HDL 38 (L) 03/10/2023   LDLCALC 38 03/10/2023   LDLDIRECT 160.2 03/01/2010   TRIG 81 03/10/2023   CHOLHDL 2.4 03/10/2023    Lab Results  Component Value Date   HGBA1C 8.4 (H) 02/20/2023   No results for input(s): "TSH", "T4TOTAL", "FREET4", "T3FREE", "THYROIDAB" in the last 72 hours.  No results for input(s): "VITAMINB12", "FOLATE", "FERRITIN", "TIBC", "IRON", "RETICCTPCT" in the last 72 hours.     Signature  -   Susa Raring M.D on 04/10/2023 at 8:30 AM   -  To page go to [email or web address]

## 2023-04-10 NOTE — Care Management Important Message (Signed)
Important Message  Patient Details  Name: Theresa Harrington MRN: [id number] Date of Birth: 1956/03/24   Important Message Given:  Yes - Medicare IM     Dorena Bodo 04/10/2023, 2:45 PM

## 2023-04-10 NOTE — Progress Notes (Addendum)
Progress Note   Subjective  Chief Complaint: Melena  This morning patient tells me she has had no further melena.  She is nervous in regards to the fact that all of her blood thinners have been hold since last Friday and she is worried about developing a clot and having a stroke given her recent vascular surgery.  She is frustrated that we cannot find the exact source of bleeding but understanding.   Objective   Vital signs in last 24 hours: Temp:  [97.9 F (36.6 C)-98.5 F (36.9 C)] 97.9 F (36.6 C) (12/10 1200) Pulse Rate:  [58-66] 66 (12/10 1200) Resp:  [14-25] 18 (12/10 1200) BP: (128-141)/(44-58) 135/58 (12/10 1200) SpO2:  [98 %-100 %] 100 % (12/10 1200) Last BM Date : 04/09/23 General: Female in NAD Heart:  Regular rate and rhythm; no murmurs Lungs: Respirations even and unlabored, lungs CTA bilaterally Abdomen:  Soft, nontender and nondistended. Normal bowel sounds. Psych:  Cooperative. Normal mood and affect.  Intake/Output from previous day: 12/09 0701 - 12/10 0700 In: 680 [P.O.:480; I.V.:200] Out: -   Lab Results: Recent Labs    04/09/23 0443 04/09/23 1201 04/10/23 0429  WBC 8.7 8.3 8.3  HGB 7.6* 8.1* 7.1*  HCT 24.9* 26.0* 22.8*  PLT 310 262 284   BMET Recent Labs    04/08/23 0510 04/09/23 0443 04/10/23 0429  NA 139 137 138  K 3.7 3.8 3.8  CL 112* 110 111  CO2 19* 19* 20*  GLUCOSE 138* 123* 138*  BUN 8 8 11   CREATININE 0.92 0.88 0.87  CALCIUM 8.9 8.5* 8.6*     Assessment / Plan:   Assessment: 1.  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  Symptomatic iron deficiency anemia 3.  Chronic anticoagulation for A-fib and recent vascular surgery on Plavix and Xarelto as well as aspirin  Plan: 1.  Discussed with patient that recent capsule endoscopy was unrevealing.  Most likely she has an AVM in her distal small bowel that would be unable  to be reached with therapy.  At this point we will try to minimize her anticoagulation as able.  Recommend discussion with vascular and cardiology in regards to this. 2.  Continue to monitor hemoglobin with transfusion as needed less than 7, patient is getting 1 unit PRBCs today. 3.  Continue Pantoprazole 40 mg p.o. twice daily for now 4.  Patient would benefit from referral to hematology as an outpatient so they can keep an eye on her hemoglobin and give her transfusions as needed in the future.  I discussed this with the patient today.  Thank you for your kind consultation.    LOS: 3 days   Unk Lightning  04/10/2023, 12:13 PM  I have taken an interval history, thoroughly reviewed the chart and examined the patient. I agree with the Advanced Practitioner's note, impression and recommendations, and have recorded additional findings, impressions and recommendations below. I performed a substantive portion of this encounter (>50% time spent), including a complete performance of the medical decision making.  My additional thoughts are as follows: Some additional thoughts on the change to the above plan   Bleeding source unknown, though seems most likely to been an Dubsky or small bowel source such as an AVM given the known limitations of video capsule study and the overall clinical picture.  There were no bleeding sources seen on a routine polyp surveillance  colonoscopy with me in April of this year, so I think it is unlikely but still possible something in the proximal colon could have developed to cause this bleeding in the interim.  (Ulcer, AVM)  I recommended she undergo a colonoscopy with me tomorrow after bowel preparation this evening.  She was agreeable after discussion of procedure and risks as well as rationale for this testing.  The benefits and risks of the planned procedure were described in detail with the patient or (when appropriate) their health care proxy.  Risks were  outlined as including, but not limited to, bleeding, infection, perforation, adverse medication reaction leading to cardiac or pulmonary decompensation, pancreatitis (if ERCP).  The limitation of incomplete mucosal visualization was also discussed.  No guarantees or warranties were given. Patient at increased risk for cardiopulmonary complications of procedure due to medical comorbidities.   We are trying to make challenging decisions about multiple antiplatelet and anticoagulant medicines when we do not know the source of bleeding.  If the colonoscopy has no clear source of recent blood loss, then my conclusion will be it was an obscure small bowel source that we cannot find on capsule study and therefore cannot take intervention on.  The bleeding has clearly stopped, but she is unquestionably at an unknown amount of risk for rebleeding when she goes back on her admission medicines.  I think her oral anticoagulant must be resumed given her underlying A-fib and risk of stroke.  1 or both of her antiplatelet agents should most likely be resumed, and we will need to ask Dr. Myra Gianotti of vascular surgery to weigh in on what they recommend.   40 minutes were spent on this encounter (including chart review, history/exam, counseling/coordination of care, and documentation) > 50% of that time was spent on counseling and coordination of care.    Charlie Pitter III [phone]

## 2023-04-11 ENCOUNTER — Encounter (HOSPITAL_COMMUNITY): Payer: Self-pay | Admitting: Internal Medicine

## 2023-04-11 ENCOUNTER — Encounter (HOSPITAL_COMMUNITY)
Admission: EM | Disposition: A | Discharge status: Home / Self Care | Payer: Self-pay | Source: Home / Self Care | Attending: Internal Medicine

## 2023-04-11 ENCOUNTER — Inpatient Hospital Stay (HOSPITAL_COMMUNITY): Payer: PPO | Admitting: Anesthesiology

## 2023-04-11 DIAGNOSIS — K5730 Diverticulosis of large intestine without perforation or abscess without bleeding: Secondary | ICD-10-CM

## 2023-04-11 DIAGNOSIS — D175 Benign lipomatous neoplasm of intra-abdominal organs: Secondary | ICD-10-CM

## 2023-04-11 DIAGNOSIS — D62 Acute posthemorrhagic anemia: Secondary | ICD-10-CM | POA: Diagnosis not present

## 2023-04-11 DIAGNOSIS — K921 Melena: Secondary | ICD-10-CM | POA: Diagnosis not present

## 2023-04-11 DIAGNOSIS — K922 Gastrointestinal hemorrhage, unspecified: Secondary | ICD-10-CM | POA: Diagnosis not present

## 2023-04-11 HISTORY — PX: COLONOSCOPY WITH PROPOFOL: SHX5780

## 2023-04-11 LAB — CBC WITH DIFFERENTIAL/PLATELET
Abs Immature Granulocytes: 0.02 10*3/uL (ref 0.00–0.07)
Basophils Absolute: 0 10*3/uL (ref 0.0–0.1)
Basophils Relative: 0 %
Eosinophils Absolute: 0.2 10*3/uL (ref 0.0–0.5)
Eosinophils Relative: 2 %
HCT: 26 % — ABNORMAL LOW (ref 36.0–46.0)
Hemoglobin: 8.1 g/dL — ABNORMAL LOW (ref 12.0–15.0)
Immature Granulocytes: 0 %
Lymphocytes Relative: 22 %
Lymphs Abs: 1.7 10*3/uL (ref 0.7–4.0)
MCH: 25.2 pg — ABNORMAL LOW (ref 26.0–34.0)
MCHC: 31.2 g/dL (ref 30.0–36.0)
MCV: 81 fL (ref 80.0–100.0)
Monocytes Absolute: 0.7 10*3/uL (ref 0.1–1.0)
Monocytes Relative: 9 %
Neutro Abs: 5 10*3/uL (ref 1.7–7.7)
Neutrophils Relative %: 67 %
Platelets: 295 10*3/uL (ref 150–400)
RBC: 3.21 MIL/uL — ABNORMAL LOW (ref 3.87–5.11)
RDW: 15.8 % — ABNORMAL HIGH (ref 11.5–15.5)
WBC: 7.6 10*3/uL (ref 4.0–10.5)
nRBC: 0 % (ref 0.0–0.2)

## 2023-04-11 LAB — GLUCOSE, CAPILLARY
Glucose-Capillary: 108 mg/dL — ABNORMAL HIGH (ref 70–99)
Glucose-Capillary: 112 mg/dL — ABNORMAL HIGH (ref 70–99)
Glucose-Capillary: 184 mg/dL — ABNORMAL HIGH (ref 70–99)
Glucose-Capillary: 241 mg/dL — ABNORMAL HIGH (ref 70–99)

## 2023-04-11 LAB — BASIC METABOLIC PANEL
Anion gap: 5 (ref 5–15)
BUN: 12 mg/dL (ref 8–23)
CO2: 22 mmol/L (ref 22–32)
Calcium: 8.8 mg/dL — ABNORMAL LOW (ref 8.9–10.3)
Chloride: 113 mmol/L — ABNORMAL HIGH (ref 98–111)
Creatinine, Ser: 0.77 mg/dL (ref 0.44–1.00)
GFR, Estimated: >60 mL/min (ref >60)
Glucose, Bld: 97 mg/dL (ref 70–99)
Potassium: 3.4 mmol/L — ABNORMAL LOW (ref 3.5–5.1)
Sodium: 140 mmol/L (ref 135–145)

## 2023-04-11 LAB — BPAM RBC
Blood Product Expiration Date: 202412312359
ISSUE DATE / TIME: 202412101211
Unit Type and Rh: 6200

## 2023-04-11 LAB — TYPE AND SCREEN
ABO/RH(D): A POS
Antibody Screen: NEGATIVE
Unit division: 0

## 2023-04-11 LAB — BRAIN NATRIURETIC PEPTIDE: B Natriuretic Peptide: 107.6 pg/mL — ABNORMAL HIGH (ref 0.0–100.0)

## 2023-04-11 LAB — HEPARIN LEVEL (UNFRACTIONATED): Heparin Unfractionated: 0.16 [IU]/mL — ABNORMAL LOW (ref 0.30–0.70)

## 2023-04-11 LAB — MAGNESIUM: Magnesium: 1.6 mg/dL — ABNORMAL LOW (ref 1.7–2.4)

## 2023-04-11 SURGERY — COLONOSCOPY WITH PROPOFOL
Anesthesia: Monitor Anesthesia Care | Laterality: Left

## 2023-04-11 MED ORDER — DILTIAZEM HCL 30 MG PO TABS
30.0000 mg | ORAL_TABLET | Freq: Three times a day (TID) | ORAL | Status: DC
  Administered 2023-04-12 – 2023-04-13 (×4): 30 mg via ORAL
  Filled 2023-04-11 (×5): qty 1

## 2023-04-11 MED ORDER — PANTOPRAZOLE SODIUM 40 MG PO TBEC
40.0000 mg | DELAYED_RELEASE_TABLET | Freq: Every day | ORAL | Status: DC
Start: 2023-04-12 — End: 2023-04-13
  Administered 2023-04-12 – 2023-04-13 (×2): 40 mg via ORAL
  Filled 2023-04-11 (×2): qty 1

## 2023-04-11 MED ORDER — ONDANSETRON HCL 4 MG/2ML IJ SOLN
INTRAMUSCULAR | Status: DC | PRN
  Administered 2023-04-11: 4 mg via INTRAVENOUS

## 2023-04-11 MED ORDER — PHENYLEPHRINE HCL-NACL 20-0.9 MG/250ML-% IV SOLN
INTRAVENOUS | Status: DC | PRN
  Administered 2023-04-11: 80 ug via INTRAVENOUS

## 2023-04-11 MED ORDER — SODIUM CHLORIDE 0.9 % IV SOLN
INTRAVENOUS | Status: AC | PRN
  Administered 2023-04-11: 500 mL via INTRAMUSCULAR

## 2023-04-11 MED ORDER — LIDOCAINE 2% (20 MG/ML) 5 ML SYRINGE
INTRAMUSCULAR | Status: DC | PRN
  Administered 2023-04-11: 100 mg via INTRAVENOUS

## 2023-04-11 MED ORDER — POTASSIUM CHLORIDE CRYS ER 20 MEQ PO TBCR
40.0000 meq | EXTENDED_RELEASE_TABLET | Freq: Once | ORAL | Status: AC
  Administered 2023-04-11: 40 meq via ORAL
  Filled 2023-04-11: qty 2

## 2023-04-11 MED ORDER — POTASSIUM CHLORIDE CRYS ER 20 MEQ PO TBCR
20.0000 meq | EXTENDED_RELEASE_TABLET | Freq: Once | ORAL | Status: AC
  Administered 2023-04-11: 20 meq via ORAL
  Filled 2023-04-11: qty 1

## 2023-04-11 MED ORDER — ASPIRIN 325 MG PO TBEC
325.0000 mg | DELAYED_RELEASE_TABLET | Freq: Every day | ORAL | Status: DC
  Administered 2023-04-11 – 2023-04-13 (×3): 325 mg via ORAL
  Filled 2023-04-11 (×3): qty 1

## 2023-04-11 MED ORDER — HEPARIN (PORCINE) 25000 UT/250ML-% IV SOLN
1200.0000 [IU]/h | INTRAVENOUS | Status: DC
  Administered 2023-04-11: 1000 [IU]/h via INTRAVENOUS
  Filled 2023-04-11 (×2): qty 250

## 2023-04-11 MED ORDER — DEXMEDETOMIDINE HCL IN NACL 80 MCG/20ML IV SOLN
INTRAVENOUS | Status: DC | PRN
  Administered 2023-04-11: 8 ug via INTRAVENOUS

## 2023-04-11 MED ORDER — PROPOFOL 10 MG/ML IV BOLUS
INTRAVENOUS | Status: DC | PRN
  Administered 2023-04-11: 50 mg via INTRAVENOUS
  Administered 2023-04-11: 30 mg via INTRAVENOUS
  Administered 2023-04-11 (×2): 50 mg via INTRAVENOUS
  Administered 2023-04-11 (×2): 20 mg via INTRAVENOUS

## 2023-04-11 SURGICAL SUPPLY — 21 items
ELECT REM PT RETURN 9FT ADLT (ELECTROSURGICAL) IMPLANT
ELECTRODE REM PT RTRN 9FT ADLT (ELECTROSURGICAL) IMPLANT
FCP BXJMBJMB 240X2.8X (CUTTING FORCEPS)
FLOOR PAD 36X40 (MISCELLANEOUS) ×1 IMPLANT
FORCEPS BIOP RAD 4 LRG CAP 4 (CUTTING FORCEPS) IMPLANT
FORCEPS BIOP RJ4 240 W/NDL (CUTTING FORCEPS) IMPLANT
FORCEPS BXJMBJMB 240X2.8X (CUTTING FORCEPS) IMPLANT
INJECTOR/SNARE I SNARE (MISCELLANEOUS) IMPLANT
LUBRICANT JELLY 4.5OZ STERILE (MISCELLANEOUS) IMPLANT
MANIFOLD NEPTUNE II (INSTRUMENTS) IMPLANT
NDL SCLEROTHERAPY 25GX240 (NEEDLE) IMPLANT
NEEDLE SCLEROTHERAPY 25GX240 (NEEDLE) IMPLANT
PAD FLOOR 36X40 (MISCELLANEOUS) ×2 IMPLANT
PROBE APC STR FIRE (PROBE) IMPLANT
PROBE INJECTION GOLD 7FR (MISCELLANEOUS) IMPLANT
SNARE ROTATE MED OVAL 20MM (MISCELLANEOUS) IMPLANT
SYR 50ML LL SCALE MARK (SYRINGE) IMPLANT
TRAP SPECIMEN MUCOUS 40CC (MISCELLANEOUS) IMPLANT
TUBING ENDO SMARTCAP PENTAX (MISCELLANEOUS) IMPLANT
TUBING IRRIGATION ENDOGATOR (MISCELLANEOUS) ×2 IMPLANT
WATER STERILE IRR 1000ML POUR (IV SOLUTION) IMPLANT

## 2023-04-11 NOTE — Anesthesia Preprocedure Evaluation (Signed)
Anesthesia Evaluation  Patient identified by MRN, date of birth, ID band Patient awake    Reviewed: Allergy & Precautions, H&P , NPO status , Patient's Chart, lab work & pertinent test results  History of Anesthesia Complications (+) PONV and history of anesthetic complications  Airway Mallampati: II   Neck ROM: full    Dental   Pulmonary former smoker   breath sounds clear to auscultation       Cardiovascular hypertension, + Peripheral Vascular Disease  + dysrhythmias Atrial Fibrillation  Rhythm:regular Rate:Normal     Neuro/Psych  PSYCHIATRIC DISORDERS Anxiety Depression       GI/Hepatic ,GERD  ,,GI bleeding   Endo/Other  diabetes, Type 2Hypothyroidism    Renal/GU stones     Musculoskeletal  (+) Arthritis ,    Abdominal   Peds  Hematology  (+) Blood dyscrasia, anemia Hemoglobin 8.1   Anesthesia Other Findings   Reproductive/Obstetrics                             Anesthesia Physical Anesthesia Plan  ASA: 3  Anesthesia Plan: MAC   Post-op Pain Management:    Induction: Intravenous  PONV Risk Score and Plan: 3 and Propofol infusion, Treatment may vary due to age or medical condition and TIVA  Airway Management Planned: Simple Face Mask  Additional Equipment:   Intra-op Plan:   Post-operative Plan:   Informed Consent: I have reviewed the patients History and Physical, chart, labs and discussed the procedure including the risks, benefits and alternatives for the proposed anesthesia with the patient or authorized representative who has indicated his/her understanding and acceptance.     Dental advisory given  Plan Discussed with: CRNA, Anesthesiologist and Surgeon  Anesthesia Plan Comments:        Anesthesia Quick Evaluation

## 2023-04-11 NOTE — Progress Notes (Signed)
PHARMACY - ANTICOAGULATION CONSULT NOTE  Pharmacy Consult for heparin Indication: atrial fibrillation  Allergies  Allergen Reactions   Amlodipine Swelling   Atorvastatin Other (See Comments)    Muscle aches    Ampicillin Nausea And Vomiting and Other (See Comments)   Codeine Nausea And Vomiting    Ask patient   Lisinopril Cough        Penicillins Nausea And Vomiting    Did it involve swelling of the face/tongue/throat, SOB, or low BP? No Did it involve sudden or severe rash/hives, skin peeling, or any reaction on the inside of your mouth or nose? No Did you need to seek medical attention at a hospital or doctor's office? No When did it last happen?      20+ years If all above answers are "NO", may proceed with cephalosporin use.    Januvia [Sitagliptin] Nausea And Vomiting    Patient Measurements: Height: 5\' 4"  (162.6 cm) Weight: 76 kg (167 lb 8.8 oz) IBW/kg (Calculated) : 54.7 Heparin Dosing Weight: 71kg  Vital Signs: Temp: 97.2 F (36.2 C) (12/11 0929) Temp Source: Temporal (12/11 0929) BP: 130/49 (12/11 0950) Pulse Rate: 55 (12/11 0950)  Labs: Recent Labs    04/09/23 0443 04/09/23 1201 04/10/23 0429 04/10/23 1739 04/11/23 0521  HGB 7.6*   < > 7.1* 8.9* 8.1*  HCT 24.9*   < > 22.8* 28.1* 26.0*  PLT 310   < > 284 320 295  CREATININE 0.88  --  0.87  --  0.77   < > = values in this interval not displayed.    Estimated Creatinine Clearance: 69 mL/min (by C-G formula based on SCr of 0.77 mg/dL).   Medical History: Past Medical History:  Diagnosis Date   Allergy    Anxiety    Pt. denies   Arthritis    right index figer   Asymptomatic cholelithiasis    Atherosclerosis of aorta (HCC)    Atrial flutter, paroxysmal (HCC) 02/20/2023   Cataract    Clotting disorder (HCC)    Gallstones 06/2013   GERD (gastroesophageal reflux disease)    Glaucoma    "A LITTLE BIT"   History of kidney stones    lithotrispy   Hyperlipidemia    Hypertension     Hypothyroidism    no meds   Iron deficiency anemia    PAD (peripheral artery disease) (HCC)    PAF (paroxysmal atrial fibrillation) (HCC)    Peripheral arterial occlusive disease (HCC)    lower extremities   Pneumonia    PONV (postoperative nausea and vomiting)    Right ureteral stone    Type 2 diabetes mellitus (HCC)    Type   Vitamin B 12 deficiency    Wears glasses      Assessment: 67 yo W on rivaroxaban PTA for afib, held for GIB work up, last dose 12/6. SB enteroscopy, capsule study and colonoscopy without active bleed, suspect small bowel source.  APTT on admission was 37, which is barely above normal. Pharmacy consulted for heparin.    Patient received 2 units RBC transfusion this admission.   Goal of Therapy:  Heparin level 0.3-0.7 units/ml Monitor platelets by anticoagulation protocol: Yes   Plan:  Heparin 1000 units/hr. No bolus per MD  F/u 8hr heparin level  Monitor daily heparin level, CBC, signs/symptoms of bleeding   Alphia Moh, PharmD, BCPS, [facility] Clinical Pharmacist  Please check AMION for all [facility] Pharmacy phone numbers After 10:00 PM, call Main Pharmacy [phone]

## 2023-04-11 NOTE — Progress Notes (Signed)
PROGRESS NOTE                                                                                                                                                                                                             Patient Demographics:    Theresa Harrington, is a 67 y.o. female, DOB - 1956-03-05, UJW:[id number]  Outpatient Primary MD for the patient is Etta Grandchild, MD    LOS - 4  Admit date - 04/06/2023    Chief Complaint  Patient presents with   Post-op Problem       Brief Narrative (HPI from H&P)     67 y.o. female with medical history significant of hypertension, hyperlipidemia, iron deficiency anemia, PAD, GERD, Barrett's esophagus, atrial fibrillation, diabetes, CKD 2, status post lap band, spinal stenosis, depression presenting with abnormal lab and symptomatic anemia.   Patient underwent femoropopliteal bypass on November 8 and has been doing well from that standpoint after the procedure.  She did, however, noticed some increasing fatigue and increasing dyspnea on exertion for the past week or so.  She was at follow-up visit with her PCP today and had lab work drawn which showed hemoglobin of 6.4 down from previous of 8-9.  She was sent to the ED for further evaluation. She does have intermittent dark stools for few days.  In the ER she was diagnosed with severe anemia and admitted for further workup.   Subjective:   Patient in bed, appears comfortable, denies any headache, no fever, no chest pain or pressure, no shortness of breath , no abdominal pain. No focal weakness.  No melena or reported bleeding.   Assessment  & Plan :   Symptomatic iron deficiency anemia likely due to intermittent occult GI blood loss versus subacute intermittent upper GI bleed. She was on combination of aspirin, Plavix and Xarelto, all held.  Received 1 unit of packed RBC, continue to monitor CBC, no ongoing brisk bleeding, IV PPI, underwent  EGD on 04/09/2023 which appears to be unremarkable, capsule endoscopy commenced on 04/09/2023 as well was unrevealing, she had COVID 04/11/2023 which was unrevealing as well, her bleed felt most likely in the setting of distal small bowel AVM.  [name] -See discussion below regarding DAPT and anticoagulation -Continue to monitor CBC closely and transfuse as needed. -Continue with PPI  Paroxysmal A-fib.  [place]  vas 2 score of greater than 3.  Currently in RVR.    -Stable TSH and free T4, RVR likely due to the stress of anemia, on oral Cardizem and Lopressor, dose adjusted, free on IV Cardizem drip, also 2 doses of IV digoxin, RVR better. -Heart rate in the 50s, will decrease Cardizem to 30 mg p.o. every 8 hours. -He also has been on hold due to above, but given her CHA2DS2-VASc score greater than 3, will challenge with heparin drip as discussed with GI to ensure stable hemoglobin before resuming on Xarelto   PAD recent surgery for femoral-tibial bypass graft occlusion on the right side.   -Recent femoropopliteal bypass.  Aspirin Plavix and Xarelto held due to #1 above, continue statin and Zetia for secondary prevention.  I have discussed with vascular surgery, now we will continue with full dose aspirin 325 mg oral daily given her significant GI bleed on DAPT, she is going to be high risk for bypass graft occlusion  Hypertension.  Continue beta-blocker and Cardizem as above.  GERD with history of Barrett's esophagus.   PPI  CKD stage II.  Stable.  DM type II.  Continue long-acting insulin along with sliding scale.  Monitor and adjust.  Lab Results  Component Value Date   HGBA1C 8.4 (H) 02/20/2023   CBG (last 3)  Recent Labs    04/10/23 2111 04/11/23 1008 04/11/23 1238  GLUCAP 121* 108* 112*        Condition - Guarded  Family Communication  : None present  Code Status :  Full  Consults  :  GI, VVS  PUD Prophylaxis : PPI   Procedures  :     Capsule endoscopy started 04/09/2023    EGD -04/09/2023  Impression:               - Normal esophagus.                           Stomach was normal except for the known extrinsic                            compression on the gastric cardia from the                            patient's Lap-Band device.                           - The examined portion of the jejunum was normal.                           - Normal mucosa was found in the entire examined                            duodenum.                           - Successful completion of the Video Capsule                            Enteroscope placement.                           -  No specimens collected. Recommendation:           - Return patient to hospital ward for ongoing care.                           - Post video capsule diet orders written                           Video capsule study will be reviewed tomorrow with                            further recommendations.                           Hemoglobin and hematocrit this evening and CBC                            tomorrow morning.                           Hold anticoagulants and antiplatelet agents for now.      Disposition Plan  :    Status is: Observation  DVT Prophylaxis  :    SCDs Start: 04/06/23 1845    Lab Results  Component Value Date   PLT 295 04/11/2023    Diet :  Diet Order             Diet Heart Room service appropriate? Yes; Fluid consistency: Thin  Diet effective now                    Inpatient Medications  Scheduled Meds:  sodium chloride   Intravenous Once   aspirin EC  325 mg Oral Daily   diltiazem  30 mg Oral Q8H   dorzolamide-timolol  1 drop Right Eye BID   ezetimibe  10 mg Oral Daily   insulin aspart  0-15 Units Subcutaneous TID WC & HS   insulin glargine-yfgn  25 Units Subcutaneous QHS   latanoprost  1 drop Both Eyes QHS   [START ON 04/12/2023] pantoprazole  40 mg Oral Daily   potassium chloride  20 mEq Oral Once   potassium chloride  40 mEq Oral Once    rosuvastatin  5 mg Oral Daily   sodium chloride flush  3 mL Intravenous Q12H   Continuous Infusions:  heparin      PRN Meds:.acetaminophen **OR** acetaminophen, diltiazem, metoprolol tartrate, polyethylene glycol  Antibiotics  :    Anti-infectives (From admission, onward)    None         Objective:   Vitals:   04/11/23 0929 04/11/23 0939 04/11/23 0950 04/11/23 1234  BP: (!) 103/38 (!) 114/43 (!) 130/49 (!) 157/34  Pulse: (!) 59 (!) 58 (!) 55 (!) 53  Resp: 19 17 14 11   Temp: (!) 97.2 F (36.2 C)   97.8 F (36.6 C)  TempSrc: Temporal   Oral  SpO2: 100% 97% 100% 100%  Weight:      Height:        Wt Readings from Last 3 Encounters:  04/11/23 76 kg  04/06/23 74.8 kg  04/06/23 75.8 kg     Intake/Output Summary (Last 24 hours) at 04/11/2023 1319 Last data filed at 04/11/2023 0917 Gross per 24 hour  Intake 687 ml  Output --  Net 687 ml     Physical Exam  Awake Alert, Oriented X 3, No new F.N deficits, Normal affect Symmetrical Chest wall movement, Good air movement bilaterally, CTAB RRR,No Gallops,Rubs or new Murmurs, No Parasternal Heave +ve B.Sounds, Abd Soft, No tenderness, No rebound - guarding or rigidity. No Cyanosis, Clubbing or edema, No new Rash or bruise       Data Review:    Recent Labs  Lab 04/06/23 1514 04/07/23 0607 04/08/23 0510 04/09/23 0443 04/09/23 1201 04/10/23 0429 04/10/23 1739 04/11/23 0521  WBC 9.7   < > 8.2 8.7 8.3 8.3 8.7 7.6  HGB 6.3*   < > 9.0* 7.6* 8.1* 7.1* 8.9* 8.1*  HCT 22.2*   < > 28.9* 24.9* 26.0* 22.8* 28.1* 26.0*  PLT 365   < > 339 310 262 284 320 295  MCV 83.5   < > 81.6 83.0 81.5 81.1 80.7 81.0  MCH 23.7*   < > 25.4* 25.3* 25.4* 25.3* 25.6* 25.2*  MCHC 28.4*   < > 31.1 30.5 31.2 31.1 31.7 31.2  RDW 17.4*   < > 16.2* 16.5* 16.3* 16.1* 15.9* 15.8*  LYMPHSABS 1.9  --  2.3 2.4  --  1.9  --  1.7  MONOABS 0.6  --  0.8 0.8  --  0.7  --  0.7  EOSABS 0.1  --  0.2 0.2  --  0.2  --  0.2  BASOSABS 0.1  --  0.1 0.1   --  0.0  --  0.0   < > = values in this interval not displayed.    Recent Labs  Lab 04/06/23 1055 04/06/23 1055 04/06/23 1514 04/07/23 0607 04/08/23 0510 04/09/23 0443 04/10/23 0429 04/11/23 0521  NA 141  --  141 139 139 137 138 140  K 3.8  --  4.6 4.3 3.7 3.8 3.8 3.4*  CL 108  --  112* 110 112* 110 111 113*  CO2 22  --  19* 21* 19* 19* 20* 22  ANIONGAP  --    < > 10 8 8 8 7 5   GLUCOSE 202*  --  239* 114* 138* 123* 138* 97  BUN 14  --  14 11 8 8 11 12   CREATININE 0.90  --  1.05* 0.90 0.92 0.88 0.87 0.77  AST 8  --  14* 14*  --   --   --   --   ALT 8  --  11 11  --   --   --   --   ALKPHOS 62  --  52 50  --   --   --   --   BILITOT 0.3  --  0.4 0.8  --   --   --   --   ALBUMIN 3.8  --  3.1* 3.0*  --   --   --   --   TSH 0.56  --   --  0.634  --   --   --   --   BNP  --   --   --  510.7* 377.9* 134.5* 97.6 107.6*  MG  --   --   --  1.8 1.8 1.8 1.8 1.6*  CALCIUM 8.9  --  9.1 8.7* 8.9 8.5* 8.6* 8.8*   < > = values in this interval not displayed.      Recent Labs  Lab 04/06/23 1055 04/06/23 1514 04/07/23 0607 04/08/23 0510 04/09/23 0443 04/10/23 0429 04/11/23 0521  TSH 0.56  --  0.634  --   --   --   --  BNP  --   --  510.7* 377.9* 134.5* 97.6 107.6*  MG  --   --  1.8 1.8 1.8 1.8 1.6*  CALCIUM 8.9   < > 8.7* 8.9 8.5* 8.6* 8.8*   < > = values in this interval not displayed.    --------------------------------------------------------------------------------------------------------------- Lab Results  Component Value Date   CHOL 92 03/10/2023   HDL 38 (L) 03/10/2023   LDLCALC 38 03/10/2023   LDLDIRECT 160.2 03/01/2010   TRIG 81 03/10/2023   CHOLHDL 2.4 03/10/2023    Lab Results  Component Value Date   HGBA1C 8.4 (H) 02/20/2023   No results for input(s): "TSH", "T4TOTAL", "FREET4", "T3FREE", "THYROIDAB" in the last 72 hours.  No results for input(s): "VITAMINB12", "FOLATE", "FERRITIN", "TIBC", "IRON", "RETICCTPCT" in the last 72 hours.     Signature  -    Huey Bienenstock M.D on 04/11/2023 at 1:19 PM   -  To page go to [email or web address]

## 2023-04-11 NOTE — Plan of Care (Signed)
  Problem: Education: Goal: Ability to describe self-care measures that may prevent or decrease complications (Diabetes Survival Skills Education) will improve Outcome: Progressing Goal: Individualized Educational Video(s) Outcome: Progressing   Problem: Coping: Goal: Ability to adjust to condition or change in health will improve Outcome: Progressing   Problem: Fluid Volume: Goal: Ability to maintain a balanced intake and output will improve Outcome: Progressing   Problem: Health Behavior/Discharge Planning: Goal: Ability to identify and utilize available resources and services will improve Outcome: Progressing Goal: Ability to manage health-related needs will improve Outcome: Progressing   Problem: Metabolic: Goal: Ability to maintain appropriate glucose levels will improve Outcome: Progressing   Problem: Nutritional: Goal: Maintenance of adequate nutrition will improve Outcome: Progressing Goal: Progress toward achieving an optimal weight will improve Outcome: Progressing   Problem: Skin Integrity: Goal: Risk for impaired skin integrity will decrease Outcome: Progressing   Problem: Tissue Perfusion: Goal: Adequacy of tissue perfusion will improve Outcome: Progressing   Problem: Education: Goal: Knowledge of General Education information will improve Description: Including pain rating scale, medication(s)/side effects and non-pharmacologic comfort measures Outcome: Progressing   Problem: Health Behavior/Discharge Planning: Goal: Ability to manage health-related needs will improve Outcome: Progressing   Problem: Clinical Measurements: Goal: Ability to maintain clinical measurements within normal limits will improve Outcome: Progressing Goal: Will remain free from infection Outcome: Progressing Goal: Diagnostic test results will improve Outcome: Progressing Goal: Respiratory complications will improve Outcome: Progressing Goal: Cardiovascular complication will  be avoided Outcome: Progressing   Problem: Activity: Goal: Risk for activity intolerance will decrease Outcome: Progressing   Problem: Nutrition: Goal: Adequate nutrition will be maintained Outcome: Progressing   Problem: Coping: Goal: Level of anxiety will decrease Outcome: Progressing   Problem: Elimination: Goal: Will not experience complications related to bowel motility Outcome: Progressing Goal: Will not experience complications related to urinary retention Outcome: Progressing   Problem: Pain Management: Goal: General experience of comfort will improve Outcome: Progressing   Problem: Safety: Goal: Ability to remain free from injury will improve Outcome: Progressing   Problem: Skin Integrity: Goal: Risk for impaired skin integrity will decrease Outcome: Progressing   Problem: Education: Goal: Ability to identify signs and symptoms of gastrointestinal bleeding will improve Outcome: Progressing   Problem: Bowel/Gastric: Goal: Will show no signs and symptoms of gastrointestinal bleeding Outcome: Progressing   Problem: Fluid Volume: Goal: Will show no signs and symptoms of excessive bleeding Outcome: Progressing   Problem: Clinical Measurements: Goal: Complications related to the disease process, condition or treatment will be avoided or minimized Outcome: Progressing

## 2023-04-11 NOTE — Plan of Care (Signed)

## 2023-04-11 NOTE — Interval H&P Note (Signed)
History and Physical Interval Note:  04/11/2023 8:42 AM  Theresa Harrington  has presented today for surgery, with the diagnosis of Anemia.  The various methods of treatment have been discussed with the patient and family. After consideration of risks, benefits and other options for treatment, the patient has consented to  Procedure(s): COLONOSCOPY WITH PROPOFOL (Left) as a surgical intervention.  The patient's history has been reviewed, patient examined, no change in status, stable for surgery.  I have reviewed the patient's chart and labs.  Questions were answered to the patient's satisfaction.    She was seen in the endoscopy preprocedure area.  Denies chest pain dyspnea or abdominal pain.  Says there was no melena or bright red blood per rectum during or after bowel preparation, and reports that her bowel prep output is clear. Evening hemoglobin went up to 8.9 from the initial 7.1 with 1 units of PRBCs, then 8.1 this morning.  (Suspected equilibration, since this morning's level is about as expected after 1 unit PRBC transfusion)  Colonoscopy is scheduled today Charlie Pitter III

## 2023-04-11 NOTE — Progress Notes (Signed)
PHARMACY - ANTICOAGULATION CONSULT NOTE  Pharmacy Consult for heparin Indication: atrial fibrillation  Allergies  Allergen Reactions   Amlodipine Swelling   Atorvastatin Other (See Comments)    Muscle aches    Ampicillin Nausea And Vomiting and Other (See Comments)   Codeine Nausea And Vomiting    Ask patient   Lisinopril Cough        Penicillins Nausea And Vomiting    Did it involve swelling of the face/tongue/throat, SOB, or low BP? No Did it involve sudden or severe rash/hives, skin peeling, or any reaction on the inside of your mouth or nose? No Did you need to seek medical attention at a hospital or doctor's office? No When did it last happen?      20+ years If all above answers are "NO", may proceed with cephalosporin use.    Januvia [Sitagliptin] Nausea And Vomiting    Patient Measurements: Height: 5\' 4"  (162.6 cm) Weight: 76 kg (167 lb 8.8 oz) IBW/kg (Calculated) : 54.7 Heparin Dosing Weight: 71kg  Vital Signs: Temp: 97.8 F (36.6 C) (12/11 1958) Temp Source: Oral (12/11 1958) BP: 136/59 (12/11 1958) Pulse Rate: 60 (12/11 1958)  Labs: Recent Labs    04/09/23 0443 04/09/23 1201 04/10/23 0429 04/10/23 1739 04/11/23 0521 04/11/23 1940  HGB 7.6*   < > 7.1* 8.9* 8.1*  --   HCT 24.9*   < > 22.8* 28.1* 26.0*  --   PLT 310   < > 284 320 295  --   HEPARINUNFRC  --   --   --   --   --  0.16*  CREATININE 0.88  --  0.87  --  0.77  --    < > = values in this interval not displayed.    Estimated Creatinine Clearance: 69 mL/min (by C-G formula based on SCr of 0.77 mg/dL).   Assessment: 67 yo W on rivaroxaban PTA for afib, held for GIB work up, last dose 12/6. SB enteroscopy, capsule study and colonoscopy without active bleed, suspect small bowel source.  APTT on admission was 37, which is barely above normal. Pharmacy consulted for heparin.    Heparin level subtherapeutic (0.16) on infusion at 1000 units/hr. No issues with line or bleeding reported per  RN.  Goal of Therapy:  Heparin level 0.3-0.7 units/ml Monitor platelets by anticoagulation protocol: Yes   Plan:  Increase heparin to 1150 units/hr. No bolus per MD F/u 8hr heparin level   Christoper Fabian, PharmD, BCPS Please see amion for complete clinical pharmacist phone list 04/11/2023 8:34 PM

## 2023-04-11 NOTE — Op Note (Signed)
[facility], [place] Patient Name: Theresa Harrington Procedure Date : 04/11/2023 MRN: [id number] Attending MD: Starr Lake. Myrtie Neither , MD, [id number] Date of Birth: June 21, 1955 CSN: [id number] Age: 67 Admit Type: Inpatient Procedure:                Colonoscopy Indications:              Melena, Acute post hemorrhagic anemia                           no source on SBE or VCE this admission.                           Patient on [facility] for A fib, on DAPT since recent LE                            vascular bypass Providers:                Sherilyn Cooter L. Myrtie Neither, MD, Fransisca Connors, Salley Scarlet, Technician Referring MD:              Medicines:                Monitored Anesthesia Care Complications:            No immediate complications. Estimated Blood Loss:     Estimated blood loss: none. Procedure:                Pre-Anesthesia Assessment:                           - Prior to the procedure, a History and Physical                            was performed, and patient medications and                            allergies were reviewed. The patient's tolerance of                            previous anesthesia was also reviewed. The risks                            and benefits of the procedure and the sedation                            options and risks were discussed with the patient.                            All questions were answered, and informed consent                            was obtained. Prior Anticoagulants: The patient                            last  took Plavix (clopidogrel) 4 days and Xarelto                            (rivaroxaban) 4 days prior to the procedure. ASA                            Grade Assessment: IV - A patient with severe                            systemic disease that is a constant threat to life.                            After reviewing the risks and benefits, the patient                            was deemed in satisfactory condition to  undergo the                            procedure.                           After obtaining informed consent, the colonoscope                            was passed under direct vision. Throughout the                            procedure, the patient's blood pressure, pulse, and                            oxygen saturations were monitored continuously. The                            CF-HQ190L ([id number]) Olympus coloscope was                            introduced through the anus and advanced to the the                            terminal ileum, with identification of the                            appendiceal orifice and IC valve. The colonoscopy                            was performed without difficulty. The patient                            tolerated the procedure well. The quality of the                            bowel preparation was excellent. The terminal  ileum, ileocecal valve, appendiceal orifice, and                            rectum were photographed. Scope In: 9:06:45 AM Scope Out: 9:20:45 AM Scope Withdrawal Time: 0 hours 7 minutes 42 seconds  Total Procedure Duration: 0 hours 14 minutes 0 seconds  Findings:      The perianal and digital rectal examinations were normal.      The terminal ileum appeared normal.      There was a small lipoma, in the proximal ascending colon.      Multiple diverticula were found in the left colon.      The exam was otherwise without abnormality on direct and retroflexion       views. No fresh or old blood in the colon or TI. Impression:               - The examined portion of the ileum was normal.                           - Small lipoma in the proximal ascending colon.                           - Diverticulosis in the left colon.                           - The examination was otherwise normal on direct                            and retroflexion views.                           - No specimens collected.                            Active GI bleeding stopped > 48 hours ago, despite                            fluctuations in hemoglobin. Recommendation:           - Return patient to hospital ward for ongoing care.                           - Resume regular diet.                           - Vascular surgery has been asked to advise re:                            antiplatelet agent plan in a patient with obscure                            source of GI bleeding that cannot be identifed.                            (suspected to have been small bowel source such as  AVM not evident on capsule study)                           Will communicate with [facility] physician re:                            possibility of starting unfractionated heparin to                            see if bleeding recurs before resuming OAC. 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 All rights reserved. The codes documented in this report are preliminary and upon coder review may  be revised to meet current compliance requirements. Vonita Calloway L. Myrtie Neither, MD 04/11/2023 9:31:50 AM This report has been signed electronically. Number of Addenda: 0

## 2023-04-11 NOTE — Transfer of Care (Signed)
Immediate Anesthesia Transfer of Care Note  Patient: Theresa Harrington  Procedure(s) Performed: COLONOSCOPY WITH PROPOFOL (Left)  Patient Location: PACU and Endoscopy Unit  Anesthesia Type:MAC  Level of Consciousness: awake and drowsy  Airway & Oxygen Therapy: Patient Spontanous Breathing and Patient connected to face mask oxygen  Post-op Assessment: Report given to RN and Post -op Vital signs reviewed and stable  Post vital signs: Reviewed and stable  Last Vitals:  Vitals Value Taken Time  BP    Temp    Pulse 57 04/11/23 0929  Resp 18 04/11/23 0929  SpO2 100 % 04/11/23 0929  Vitals shown include unfiled device data.  Last Pain:  Vitals:   04/11/23 0836  TempSrc: Temporal  PainSc: 0-No pain         Complications: No notable events documented.

## 2023-04-12 ENCOUNTER — Encounter (HOSPITAL_COMMUNITY): Payer: Self-pay | Admitting: Gastroenterology

## 2023-04-12 DIAGNOSIS — D509 Iron deficiency anemia, unspecified: Secondary | ICD-10-CM | POA: Diagnosis not present

## 2023-04-12 DIAGNOSIS — K922 Gastrointestinal hemorrhage, unspecified: Secondary | ICD-10-CM | POA: Diagnosis not present

## 2023-04-12 DIAGNOSIS — I4891 Unspecified atrial fibrillation: Secondary | ICD-10-CM | POA: Diagnosis not present

## 2023-04-12 DIAGNOSIS — Z7901 Long term (current) use of anticoagulants: Secondary | ICD-10-CM | POA: Diagnosis not present

## 2023-04-12 DIAGNOSIS — K921 Melena: Secondary | ICD-10-CM | POA: Diagnosis not present

## 2023-04-12 LAB — CBC WITH DIFFERENTIAL/PLATELET
Abs Immature Granulocytes: 0.02 10*3/uL (ref 0.00–0.07)
Basophils Absolute: 0 10*3/uL (ref 0.0–0.1)
Basophils Relative: 1 %
Eosinophils Absolute: 0.2 10*3/uL (ref 0.0–0.5)
Eosinophils Relative: 3 %
HCT: 26.6 % — ABNORMAL LOW (ref 36.0–46.0)
Hemoglobin: 8.2 g/dL — ABNORMAL LOW (ref 12.0–15.0)
Immature Granulocytes: 0 %
Lymphocytes Relative: 32 %
Lymphs Abs: 2.5 10*3/uL (ref 0.7–4.0)
MCH: 25 pg — ABNORMAL LOW (ref 26.0–34.0)
MCHC: 30.8 g/dL (ref 30.0–36.0)
MCV: 81.1 fL (ref 80.0–100.0)
Monocytes Absolute: 0.7 10*3/uL (ref 0.1–1.0)
Monocytes Relative: 9 %
Neutro Abs: 4.4 10*3/uL (ref 1.7–7.7)
Neutrophils Relative %: 55 %
Platelets: 294 10*3/uL (ref 150–400)
RBC: 3.28 MIL/uL — ABNORMAL LOW (ref 3.87–5.11)
RDW: 15.9 % — ABNORMAL HIGH (ref 11.5–15.5)
WBC: 7.9 10*3/uL (ref 4.0–10.5)
nRBC: 0.3 % — ABNORMAL HIGH (ref 0.0–0.2)

## 2023-04-12 LAB — GLUCOSE, CAPILLARY
Glucose-Capillary: 143 mg/dL — ABNORMAL HIGH (ref 70–99)
Glucose-Capillary: 175 mg/dL — ABNORMAL HIGH (ref 70–99)
Glucose-Capillary: 237 mg/dL — ABNORMAL HIGH (ref 70–99)
Glucose-Capillary: 203 mg/dL — ABNORMAL HIGH (ref 70–99)

## 2023-04-12 LAB — BASIC METABOLIC PANEL
Anion gap: 5 (ref 5–15)
BUN: 7 mg/dL — ABNORMAL LOW (ref 8–23)
CO2: 22 mmol/L (ref 22–32)
Calcium: 8.6 mg/dL — ABNORMAL LOW (ref 8.9–10.3)
Chloride: 111 mmol/L (ref 98–111)
Creatinine, Ser: 0.73 mg/dL (ref 0.44–1.00)
GFR, Estimated: >60 mL/min (ref >60)
Glucose, Bld: 174 mg/dL — ABNORMAL HIGH (ref 70–99)
Potassium: 4 mmol/L (ref 3.5–5.1)
Sodium: 138 mmol/L (ref 135–145)

## 2023-04-12 LAB — HEPARIN LEVEL (UNFRACTIONATED): Heparin Unfractionated: 0.34 [IU]/mL (ref 0.30–0.70)

## 2023-04-12 NOTE — Progress Notes (Signed)
Mobility Specialist Progress Note;   04/12/23 1155  Mobility  Activity Ambulated independently in hallway  Level of Assistance Modified independent, requires aide device or extra time  Assistive Device None  Distance Ambulated (ft) 500 ft  Activity Response Tolerated well  Mobility Referral Yes  Mobility visit 1 Mobility  Mobility Specialist Start Time (ACUTE ONLY) 1155  Mobility Specialist Stop Time (ACUTE ONLY) 1205  Mobility Specialist Time Calculation (min) (ACUTE ONLY) 10 min   Pt agreeable to mobility. Required no physical assistance during ambulation. VSS throughout and no c/o during session. Pt returned back to chair with all needs met.   Caesar Bookman Mobility Specialist Please contact via SecureChat or Rehab Office [phone]

## 2023-04-12 NOTE — Progress Notes (Signed)
PHARMACY - ANTICOAGULATION CONSULT NOTE  Pharmacy Consult for heparin Indication: atrial fibrillation  Allergies  Allergen Reactions   Amlodipine Swelling   Atorvastatin Other (See Comments)    Muscle aches    Ampicillin Nausea And Vomiting and Other (See Comments)   Codeine Nausea And Vomiting    Ask patient   Lisinopril Cough        Penicillins Nausea And Vomiting    Did it involve swelling of the face/tongue/throat, SOB, or low BP? No Did it involve sudden or severe rash/hives, skin peeling, or any reaction on the inside of your mouth or nose? No Did you need to seek medical attention at a hospital or doctor's office? No When did it last happen?      20+ years If all above answers are "NO", may proceed with cephalosporin use.    Januvia [Sitagliptin] Nausea And Vomiting    Patient Measurements: Height: 5\' 4"  (162.6 cm) Weight: 76 kg (167 lb 8.8 oz) IBW/kg (Calculated) : 54.7 Heparin Dosing Weight: 71kg  Vital Signs: Temp: 98 F (36.7 C) (12/12 0745) Temp Source: Oral (12/12 0745) BP: 139/59 (12/12 0745) Pulse Rate: 57 (12/12 0333)  Labs: Recent Labs    04/10/23 0429 04/10/23 1739 04/11/23 0521 04/11/23 1940 04/12/23 0451  HGB 7.1* 8.9* 8.1*  --  8.2*  HCT 22.8* 28.1* 26.0*  --  26.6*  PLT 284 320 295  --  294  HEPARINUNFRC  --   --   --  0.16* 0.34  CREATININE 0.87  --  0.77  --  0.73    Estimated Creatinine Clearance: 69 mL/min (by C-G formula based on SCr of 0.73 mg/dL).   Medical History: Past Medical History:  Diagnosis Date   Allergy    Anxiety    Pt. denies   Arthritis    right index figer   Asymptomatic cholelithiasis    Atherosclerosis of aorta (HCC)    Atrial flutter, paroxysmal (HCC) 02/20/2023   Cataract    Clotting disorder (HCC)    Gallstones 06/2013   GERD (gastroesophageal reflux disease)    Glaucoma    "A LITTLE BIT"   History of kidney stones    lithotrispy   Hyperlipidemia    Hypertension    Hypothyroidism    no meds    Iron deficiency anemia    PAD (peripheral artery disease) (HCC)    PAF (paroxysmal atrial fibrillation) (HCC)    Peripheral arterial occlusive disease (HCC)    lower extremities   Pneumonia    PONV (postoperative nausea and vomiting)    Right ureteral stone    Type 2 diabetes mellitus (HCC)    Type   Vitamin B 12 deficiency    Wears glasses      Assessment: 67 yo W on rivaroxaban PTA for afib, held for GIB work up, last dose 12/6. SB enteroscopy, capsule study and colonoscopy without active bleed, suspect small bowel source.  APTT on admission was 37, which is barely above normal. Pharmacy consulted for heparin.    Heparin level 0.34 is therapeutic on 1150 units/hr. Will increase slightly to keep in range.    Goal of Therapy:  Heparin level 0.3-0.7 units/ml Monitor platelets by anticoagulation protocol: Yes   Plan:  Increase Heparin to 1200 units/hr    Monitor daily heparin level, CBC, signs/symptoms of bleeding  F/u restart rivaroxaban   Alphia Moh, PharmD, BCPS, BCCP Clinical Pharmacist  Please check AMION for all [facility] Pharmacy phone numbers After 10:00 PM, call Main Pharmacy  [phone]  

## 2023-04-12 NOTE — Progress Notes (Signed)
PROGRESS NOTE                                                                                                                                                                                                             Patient Demographics:    Theresa Harrington, is a 67 y.o. female, DOB - 04-May-1955, WNU:[id number]  Outpatient Primary MD for the patient is Etta Grandchild, MD    LOS - 5  Admit date - 04/06/2023    Chief Complaint  Patient presents with   Post-op Problem       Brief Narrative (HPI from H&P)     67 y.o. female with medical history significant of hypertension, hyperlipidemia, iron deficiency anemia, PAD, GERD, Barrett's esophagus, atrial fibrillation, diabetes, CKD 2, status post lap band, spinal stenosis, depression presenting with abnormal lab and symptomatic anemia.   Patient underwent femoropopliteal bypass on November 8 and has been doing well from that standpoint after the procedure.  She did, however, noticed some increasing fatigue and increasing dyspnea on exertion for the past week or so.  She was at follow-up visit with her PCP today and had lab work drawn which showed hemoglobin of 6.4 down from previous of 8-9.  She was sent to the ED for further evaluation. She does have intermittent dark stools for few days.  In the ER she was diagnosed with severe anemia and admitted for further workup.   Subjective:   No BM overnight, she denies any complaints today, she is asking when she can go home  Assessment  & Plan :   Symptomatic iron deficiency anemia likely due to intermittent occult GI blood loss versus subacute intermittent upper GI bleed.  -She was on combination of aspirin, Plavix and Xarelto, will hold on admission,. -Required PRBC transfusion, continue to monitor and transfuse as needed. - underwent EGD on 04/09/2023 which appears to be unremarkable, capsule endoscopy commenced on 04/09/2023 as well was  unrevealing, she had COVID 04/11/2023 which was unrevealing as well, her bleed felt most likely in the setting of distal small bowel AVM.  [name] -See discussion below regarding DAPT and anticoagulation -Continue to monitor CBC closely and transfuse as needed.  Given stable this morning at 8.2 -Continue with PPI  Paroxysmal A-fib.  [place] vas 2 score of greater than 3.  Currently in RVR.    -  Stable TSH and free T4, RVR likely due to the stress of anemia, on oral Cardizem and Lopressor, dose adjusted, free on IV Cardizem drip, also 2 doses of IV digoxin, RVR better. -Heart rate in the 50s, will decrease Cardizem to 30 mg p.o. every 8 hours. -He also has been on hold due to above, but given her CHA2DS2-VASc score greater than 3, will challenge with heparin drip as discussed with GI to ensure stable hemoglobin before resuming on Xarelto, will monitor on heparin GTT for another 24 hours, and if stable can resume Xarelto tomorrow upon discharge   PAD recent surgery for femoral-tibial bypass graft occlusion on the right side.   -Recent femoropopliteal bypass.  Aspirin Plavix and Xarelto held due to #1 above, continue statin and Zetia for secondary prevention.  I have discussed with vascular surgery, now we will continue with full dose aspirin 325 mg oral daily given her significant GI bleed on DAPT, she is going to be high risk for bypass graft occlusion  Hypertension.  Continue beta-blocker and Cardizem as above.  GERD with history of Barrett's esophagus.   PPI  CKD stage II.  Stable.  DM type II.  Continue long-acting insulin along with sliding scale.  Monitor and adjust.  Lab Results  Component Value Date   HGBA1C 8.4 (H) 02/20/2023   CBG (last 3)  Recent Labs    04/11/23 1605 04/11/23 2120 04/12/23 0752  GLUCAP 184* 241* 143*        Condition - Guarded  Family Communication  : None present  Code Status :  Full  Consults  :  GI, VVS  PUD Prophylaxis : PPI   Procedures  :      Capsule endoscopy started 04/09/2023   EGD -04/09/2023  Impression:               - Normal esophagus.                           Stomach was normal except for the known extrinsic                            compression on the gastric cardia from the                            patient's Lap-Band device.                           - The examined portion of the jejunum was normal.                           - Normal mucosa was found in the entire examined                            duodenum.                           - Successful completion of the Video Capsule                            Enteroscope placement.                           -  No specimens collected. Recommendation:           - Return patient to hospital ward for ongoing care.                           - Post video capsule diet orders written                           Video capsule study will be reviewed tomorrow with                            further recommendations.                           Hemoglobin and hematocrit this evening and CBC                            tomorrow morning.                           Hold anticoagulants and antiplatelet agents for now.      Disposition Plan  :    Status is: Observation  DVT Prophylaxis  :    SCDs Start: 04/06/23 1845    Lab Results  Component Value Date   PLT 294 04/12/2023    Diet :  Diet Order             Diet Heart Room service appropriate? Yes; Fluid consistency: Thin  Diet effective now                    Inpatient Medications  Scheduled Meds:  sodium chloride   Intravenous Once   aspirin EC  325 mg Oral Daily   diltiazem  30 mg Oral Q8H   dorzolamide-timolol  1 drop Right Eye BID   ezetimibe  10 mg Oral Daily   insulin aspart  0-15 Units Subcutaneous TID WC & HS   insulin glargine-yfgn  25 Units Subcutaneous QHS   latanoprost  1 drop Both Eyes QHS   pantoprazole  40 mg Oral Daily   rosuvastatin  5 mg Oral Daily   sodium chloride flush  3 mL  Intravenous Q12H   Continuous Infusions:  heparin 1,200 Units/hr (04/12/23 0906)    PRN Meds:.acetaminophen **OR** acetaminophen, diltiazem, metoprolol tartrate, polyethylene glycol  Antibiotics  :    Anti-infectives (From admission, onward)    None         Objective:   Vitals:   04/11/23 2356 04/12/23 0333 04/12/23 0745 04/12/23 0900  BP:  (!) 137/59 (!) 139/59   Pulse: (!) 54 (!) 57    Resp: 19 18 14 20   Temp:  97.8 F (36.6 C) 98 F (36.7 C)   TempSrc:  Oral Oral   SpO2:      Weight:      Height:        Wt Readings from Last 3 Encounters:  04/11/23 76 kg  04/06/23 74.8 kg  04/06/23 75.8 kg     Intake/Output Summary (Last 24 hours) at 04/12/2023 1120 Last data filed at 04/12/2023 0300 Gross per 24 hour  Intake 374.43 ml  Output --  Net 374.43 ml     Physical Exam  Awake Alert, Oriented X 3, No new F.N deficits, Normal affect Symmetrical  Chest wall movement, Good air movement bilaterally, CTAB RRR,No Gallops,Rubs or new Murmurs, No Parasternal Heave +ve B.Sounds, Abd Soft, No tenderness, No rebound - guarding or rigidity. No Cyanosis, Clubbing or edema, No new Rash or bruise       Data Review:    Recent Labs  Lab 04/08/23 0510 04/09/23 0443 04/09/23 1201 04/10/23 0429 04/10/23 1739 04/11/23 0521 04/12/23 0451  WBC 8.2 8.7 8.3 8.3 8.7 7.6 7.9  HGB 9.0* 7.6* 8.1* 7.1* 8.9* 8.1* 8.2*  HCT 28.9* 24.9* 26.0* 22.8* 28.1* 26.0* 26.6*  PLT 339 310 262 284 320 295 294  MCV 81.6 83.0 81.5 81.1 80.7 81.0 81.1  MCH 25.4* 25.3* 25.4* 25.3* 25.6* 25.2* 25.0*  MCHC 31.1 30.5 31.2 31.1 31.7 31.2 30.8  RDW 16.2* 16.5* 16.3* 16.1* 15.9* 15.8* 15.9*  LYMPHSABS 2.3 2.4  --  1.9  --  1.7 2.5  MONOABS 0.8 0.8  --  0.7  --  0.7 0.7  EOSABS 0.2 0.2  --  0.2  --  0.2 0.2  BASOSABS 0.1 0.1  --  0.0  --  0.0 0.0    Recent Labs  Lab 04/06/23 1055 04/06/23 1055 04/06/23 1514 04/07/23 0607 04/08/23 0510 04/09/23 0443 04/10/23 0429 04/11/23 0521  04/12/23 0451  NA 141  --  141 139 139 137 138 140 138  K 3.8  --  4.6 4.3 3.7 3.8 3.8 3.4* 4.0  CL 108  --  112* 110 112* 110 111 113* 111  CO2 22  --  19* 21* 19* 19* 20* 22 22  ANIONGAP  --    < > 10 8 8 8 7 5 5   GLUCOSE 202*  --  239* 114* 138* 123* 138* 97 174*  BUN 14  --  14 11 8 8 11 12  7*  CREATININE 0.90  --  1.05* 0.90 0.92 0.88 0.87 0.77 0.73  AST 8  --  14* 14*  --   --   --   --   --   ALT 8  --  11 11  --   --   --   --   --   ALKPHOS 62  --  52 50  --   --   --   --   --   BILITOT 0.3  --  0.4 0.8  --   --   --   --   --   ALBUMIN 3.8  --  3.1* 3.0*  --   --   --   --   --   TSH 0.56  --   --  0.634  --   --   --   --   --   BNP  --   --   --  510.7* 377.9* 134.5* 97.6 107.6*  --   MG  --   --   --  1.8 1.8 1.8 1.8 1.6*  --   CALCIUM 8.9  --  9.1 8.7* 8.9 8.5* 8.6* 8.8* 8.6*   < > = values in this interval not displayed.      Recent Labs  Lab 04/06/23 1055 04/06/23 1514 04/07/23 0607 04/08/23 0510 04/09/23 0443 04/10/23 0429 04/11/23 0521 04/12/23 0451  TSH 0.56  --  0.634  --   --   --   --   --   BNP  --   --  510.7* 377.9* 134.5* 97.6 107.6*  --   MG  --   --  1.8 1.8 1.8  1.8 1.6*  --   CALCIUM 8.9   < > 8.7* 8.9 8.5* 8.6* 8.8* 8.6*   < > = values in this interval not displayed.    --------------------------------------------------------------------------------------------------------------- Lab Results  Component Value Date   CHOL 92 03/10/2023   HDL 38 (L) 03/10/2023   LDLCALC 38 03/10/2023   LDLDIRECT 160.2 03/01/2010   TRIG 81 03/10/2023   CHOLHDL 2.4 03/10/2023    Lab Results  Component Value Date   HGBA1C 8.4 (H) 02/20/2023   No results for input(s): "TSH", "T4TOTAL", "FREET4", "T3FREE", "THYROIDAB" in the last 72 hours.  No results for input(s): "VITAMINB12", "FOLATE", "FERRITIN", "TIBC", "IRON", "RETICCTPCT" in the last 72 hours.     Signature  -   Huey Bienenstock M.D on 04/12/2023 at 11:20 AM   -  To page go to [email or web address]

## 2023-04-12 NOTE — Plan of Care (Signed)
  Problem: Education: Goal: Ability to describe self-care measures that may prevent or decrease complications (Diabetes Survival Skills Education) will improve Outcome: Progressing   Problem: Coping: Goal: Ability to adjust to condition or change in health will improve Outcome: Progressing   Problem: Health Behavior/Discharge Planning: Goal: Ability to identify and utilize available resources and services will improve Outcome: Progressing   Problem: Metabolic: Goal: Ability to maintain appropriate glucose levels will improve Outcome: Progressing   

## 2023-04-12 NOTE — Anesthesia Postprocedure Evaluation (Signed)
Anesthesia Post Note  Patient: Theresa Harrington  Procedure(s) Performed: COLONOSCOPY WITH PROPOFOL (Left)     Patient location during evaluation: PACU Anesthesia Type: MAC Level of consciousness: awake and alert Pain management: pain level controlled Vital Signs Assessment: post-procedure vital signs reviewed and stable Respiratory status: spontaneous breathing, nonlabored ventilation, respiratory function stable and patient connected to nasal cannula oxygen Cardiovascular status: stable and blood pressure returned to baseline Postop Assessment: no apparent nausea or vomiting Anesthetic complications: no   No notable events documented.  Last Vitals:  Vitals:   04/12/23 0745 04/12/23 0900  BP: (!) 139/59   Pulse:    Resp: 14 20  Temp: 36.7 C   SpO2:      Last Pain:  Vitals:   04/12/23 0900  TempSrc:   PainSc: 0-No pain                 Tamma Brigandi S

## 2023-04-12 NOTE — Progress Notes (Signed)
PHARMACY - ANTICOAGULATION CONSULT NOTE  Pharmacy Consult for heparin Indication: atrial fibrillation  Allergies  Allergen Reactions   Amlodipine Swelling   Atorvastatin Other (See Comments)    Muscle aches    Ampicillin Nausea And Vomiting and Other (See Comments)   Codeine Nausea And Vomiting    Ask patient   Lisinopril Cough        Penicillins Nausea And Vomiting    Did it involve swelling of the face/tongue/throat, SOB, or low BP? No Did it involve sudden or severe rash/hives, skin peeling, or any reaction on the inside of your mouth or nose? No Did you need to seek medical attention at a hospital or doctor's office? No When did it last happen?      20+ years If all above answers are "NO", may proceed with cephalosporin use.    Januvia [Sitagliptin] Nausea And Vomiting    Patient Measurements: Height: 5\' 4"  (162.6 cm) Weight: 76 kg (167 lb 8.8 oz) IBW/kg (Calculated) : 54.7 Heparin Dosing Weight: 71kg  Vital Signs: Temp: 97.8 F (36.6 C) (12/12 0333) Temp Source: Oral (12/12 0333) BP: 137/59 (12/12 0333) Pulse Rate: 57 (12/12 0333)  Labs: Recent Labs    04/10/23 0429 04/10/23 1739 04/11/23 0521 04/11/23 1940 04/12/23 0451  HGB 7.1* 8.9* 8.1*  --  8.2*  HCT 22.8* 28.1* 26.0*  --  26.6*  PLT 284 320 295  --  294  HEPARINUNFRC  --   --   --  0.16* 0.34  CREATININE 0.87  --  0.77  --  0.73    Estimated Creatinine Clearance: 69 mL/min (by C-G formula based on SCr of 0.73 mg/dL).   Assessment: 67 yo W on rivaroxaban PTA for afib, held for GIB work up, last dose 12/6. SB enteroscopy, capsule study and colonoscopy without active bleed, suspect small bowel source.  APTT on admission was 37, which is barely above normal. Pharmacy consulted for heparin.    Heparin level subtherapeutic (0.16) on infusion at 1000 units/hr. No issues with line or bleeding reported per RN.  12/12 AM update:  Heparin level sub-therapeutic   Goal of Therapy:  Heparin level 0.3-0.7  units/ml Monitor platelets by anticoagulation protocol: Yes   Plan:  Cont heparin 1150 units/hr 1400 heparin level  Abran Duke, PharmD, BCPS Clinical Pharmacist Phone: [phone]

## 2023-04-12 NOTE — Progress Notes (Addendum)
Progress Note   Subjective  Chief Complaint: Melena, acute on chronic iron deficiency anemia  Pill capsule endoscopy 04/09/2023 and enteroscopy unrevealing  Colonoscopy 04/11/2023 with small lipoma in the proximal ascending colon diverticulosis in the left colon  This morning patient is sitting comfortably in her chair.  She tells me that she has not passed any stool since time for colonoscopy yesterday.  She is currently receiving IV heparin.  She wonders when she can go home.   Objective   Vital signs in last 24 hours: Temp:  [97.8 F (36.6 C)-98 F (36.7 C)] 98 F (36.7 C) (12/12 0745) Pulse Rate:  [54-60] 57 (12/12 0333) Resp:  [14-20] 20 (12/12 0900) BP: (136-142)/(53-59) 139/59 (12/12 0745) Last BM Date : 04/10/23 General:  Female in NAD Heart:  Regular rate and rhythm; no murmurs Lungs: Respirations even and unlabored, lungs CTA bilaterally Abdomen:  Soft, nontender and nondistended. Normal bowel sounds. Psych:  Cooperative. Normal mood and affect.  Intake/Output from previous day: 12/11 0701 - 12/12 0700 In: 499.4 [P.O.:240; I.V.:259.4] Out: -    Lab Results: Recent Labs    04/10/23 1739 04/11/23 0521 04/12/23 0451  WBC 8.7 7.6 7.9  HGB 8.9* 8.1* 8.2*  HCT 28.1* 26.0* 26.6*  PLT 320 295 294   BMET Recent Labs    04/10/23 0429 04/11/23 0521 04/12/23 0451  NA 138 140 138  K 3.8 3.4* 4.0  CL 111 113* 111  CO2 20* 22 22  GLUCOSE 138* 97 174*  BUN 11 12 7*  CREATININE 0.87 0.77 0.73  CALCIUM 8.6* 8.8* 8.6*    Assessment / Plan:   Assessment: 1.  Melena 2.  Symptomatic iron deficiency anemia 3.  Chronic anticoagulation for A-fib and recent vascular surgery on Plavix and Xarelto as well as Aspirin outpatient  Plan: 1.  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  Heparin given overnight as a challenge to her system, we will see how her hemoglobin responds and if she has any further bloody bowel movements, will need to  consider what anticoagulation she goes home on 3.  Continue to monitor hemoglobin with transfusion as needed less than 7 4. Possible discharge tomorrow pending course  Thank you for your kind consultation, we will continue to follow along   LOS: 5 days   Unk Lightning  04/12/2023, 12:54 PM  I have taken an interval history, thoroughly reviewed the chart and examined the patient. I agree with the Advanced Practitioner's note, impression and recommendations, and have recorded additional findings, impressions and recommendations below. I performed a substantive portion of this encounter (>50% time spent), including a complete performance of the medical decision making.  My additional thoughts are as follows:  No recurrence of bleeding after 24 hours on unfractionated heparin.  Xarelto will be resumed and she has also been started back on aspirin 325 mg daily.  No additional GI symptoms since seen yesterday.  Discharge home tomorrow if no recurrence of overt bleeding and hemoglobin stable.  My office will contact her to arrange a CBC next week.  She and I had another discussion about the uncertainties of this bleeding episode with no identified source but that I suspected been in the small bowel.  Risk of rebleeding is unknown, but probably greater with the more blood thinner she is on.  She is in a very difficult situation of her recent vascular surgery that ideally requires DAPT for certain period of time,  and our vascular surgery consultant was agreeable to just aspirin with a course some reservations and concerns about the risk of vascular graft thrombosis.  She has underlying A-fib and would risk a CVA if not anticoagulation.  I will check back with Dr. Myra Gianotti in the near future and asked the usual period of time which they would have somebody on DAPT after such a vascular procedure.  If it is felt strongly that that is the best chance to reduce the chance of postsurgical graft  thrombosis for at least 6 months or 12 months (what ever they normally do in this scenario), then I told me me we should consider resuming her Plavix if there is no recurrence of bleeding in the next 2 weeks.  She is understandably concerned, as MI, but I am trying very hard to balance the risks and benefits of what medicines we do and do not use in this situation.  Naturally, she would have to be agreeable to accepting the risk of rebleeding if she did go back on Plavix.  We will revisit this in the next couple of weeks as the situation progresses.   Charlie Pitter III Office:[phone]

## 2023-04-13 ENCOUNTER — Other Ambulatory Visit (HOSPITAL_COMMUNITY): Payer: Self-pay

## 2023-04-13 ENCOUNTER — Telehealth: Payer: Self-pay

## 2023-04-13 ENCOUNTER — Other Ambulatory Visit: Payer: Self-pay | Admitting: *Deleted

## 2023-04-13 DIAGNOSIS — K922 Gastrointestinal hemorrhage, unspecified: Secondary | ICD-10-CM | POA: Diagnosis not present

## 2023-04-13 LAB — CBC
HCT: 30.8 % — ABNORMAL LOW (ref 36.0–46.0)
Hemoglobin: 9.3 g/dL — ABNORMAL LOW (ref 12.0–15.0)
MCH: 25.1 pg — ABNORMAL LOW (ref 26.0–34.0)
MCHC: 30.2 g/dL (ref 30.0–36.0)
MCV: 83.2 fL (ref 80.0–100.0)
Platelets: 301 10*3/uL (ref 150–400)
RBC: 3.7 MIL/uL — ABNORMAL LOW (ref 3.87–5.11)
RDW: 15.9 % — ABNORMAL HIGH (ref 11.5–15.5)
WBC: 7.5 10*3/uL (ref 4.0–10.5)
nRBC: 0 % (ref 0.0–0.2)

## 2023-04-13 LAB — HEPARIN LEVEL (UNFRACTIONATED): Heparin Unfractionated: 0.48 [IU]/mL (ref 0.30–0.70)

## 2023-04-13 MED ORDER — PANTOPRAZOLE SODIUM 40 MG PO TBEC
40.0000 mg | DELAYED_RELEASE_TABLET | Freq: Every day | ORAL | 0 refills | Status: DC
  Filled 2023-04-13: qty 30, 30d supply, fill #0

## 2023-04-13 MED ORDER — DILTIAZEM HCL ER 60 MG PO CP12
60.0000 mg | ORAL_CAPSULE | Freq: Two times a day (BID) | ORAL | 0 refills | Status: DC
  Filled 2023-04-13: qty 60, 30d supply, fill #0

## 2023-04-13 MED ORDER — PANTOPRAZOLE SODIUM 40 MG PO TBEC
40.0000 mg | DELAYED_RELEASE_TABLET | Freq: Two times a day (BID) | ORAL | 0 refills | Status: DC
  Filled 2023-04-13: qty 60, 30d supply, fill #0

## 2023-04-13 MED ORDER — DILTIAZEM HCL ER 60 MG PO CP12: 60.0000 mg | ORAL_CAPSULE | Freq: Two times a day (BID) | ORAL | Status: DC

## 2023-04-13 MED ORDER — ASPIRIN 325 MG PO TBEC
325.0000 mg | DELAYED_RELEASE_TABLET | Freq: Every day | ORAL | 0 refills | Status: DC
  Filled 2023-04-13: qty 30, 30d supply, fill #0

## 2023-04-13 NOTE — Plan of Care (Signed)
  Problem: Education: Goal: Ability to describe self-care measures that may prevent or decrease complications (Diabetes Survival Skills Education) will improve Outcome: Progressing Goal: Individualized Educational Video(s) Outcome: Progressing   Problem: Coping: Goal: Ability to adjust to condition or change in health will improve Outcome: Progressing   Problem: Fluid Volume: Goal: Ability to maintain a balanced intake and output will improve Outcome: Progressing   Problem: Health Behavior/Discharge Planning: Goal: Ability to identify and utilize available resources and services will improve Outcome: Progressing Goal: Ability to manage health-related needs will improve Outcome: Progressing   Problem: Metabolic: Goal: Ability to maintain appropriate glucose levels will improve Outcome: Progressing   Problem: Nutritional: Goal: Maintenance of adequate nutrition will improve Outcome: Progressing Goal: Progress toward achieving an optimal weight will improve Outcome: Progressing   Problem: Skin Integrity: Goal: Risk for impaired skin integrity will decrease Outcome: Progressing   Problem: Tissue Perfusion: Goal: Adequacy of tissue perfusion will improve Outcome: Progressing   Problem: Education: Goal: Knowledge of General Education information will improve Description: Including pain rating scale, medication(s)/side effects and non-pharmacologic comfort measures Outcome: Progressing   Problem: Health Behavior/Discharge Planning: Goal: Ability to manage health-related needs will improve Outcome: Progressing   Problem: Clinical Measurements: Goal: Ability to maintain clinical measurements within normal limits will improve Outcome: Progressing Goal: Will remain free from infection Outcome: Progressing Goal: Diagnostic test results will improve Outcome: Progressing Goal: Respiratory complications will improve Outcome: Progressing Goal: Cardiovascular complication will  be avoided Outcome: Progressing   Problem: Activity: Goal: Risk for activity intolerance will decrease Outcome: Progressing   Problem: Nutrition: Goal: Adequate nutrition will be maintained Outcome: Progressing   Problem: Coping: Goal: Level of anxiety will decrease Outcome: Progressing   Problem: Elimination: Goal: Will not experience complications related to bowel motility Outcome: Progressing Goal: Will not experience complications related to urinary retention Outcome: Progressing   Problem: Pain Management: Goal: General experience of comfort will improve Outcome: Progressing   Problem: Safety: Goal: Ability to remain free from injury will improve Outcome: Progressing   Problem: Skin Integrity: Goal: Risk for impaired skin integrity will decrease Outcome: Progressing   Problem: Education: Goal: Ability to identify signs and symptoms of gastrointestinal bleeding will improve Outcome: Progressing   Problem: Bowel/Gastric: Goal: Will show no signs and symptoms of gastrointestinal bleeding Outcome: Progressing   Problem: Fluid Volume: Goal: Will show no signs and symptoms of excessive bleeding Outcome: Progressing   Problem: Clinical Measurements: Goal: Complications related to the disease process, condition or treatment will be avoided or minimized Outcome: Progressing

## 2023-04-13 NOTE — Discharge Summary (Incomplete)
Physician Discharge Summary  Theresa Harrington QMV:[id number] DOB: 1956-04-30 DOA: 04/06/2023  PCP: Etta Grandchild, MD  Admit date: 04/06/2023 Discharge date: 04/13/2023  Admitted From: ***(Home, ALF, ILF, SNF) Disposition:  ***(Home /***facility name / Residential Hospice)  Recommendations for Outpatient Follow-up:  Follow up with PCP in 1-2 weeks Please obtain BMP/CBC in one week Please follow up on the following pending results: Repeat CBC, hold Plavix, continue aspirin, hold hydralazine, change Cardizem.  Home Health:*** (YES/NO) Equipment/Devices:*** (oxygen ?L, foley, PICC line--date placed, wound care, Pleurx)  Discharge Condition:*** (Stable, guarded, hospice) CODE STATUS:*** (FULL, DNR, Comfort Care) Diet recommendation: Heart Healthy / Carb Modified / Regular / Dysphagia   Brief/Interim Summary: You may copy/paste interim summary or write brief hospital course depending on length of stay  Discharge Diagnoses:  Principal Problem:   Acute GI bleeding Active Problems:   Hyperlipidemia with target LDL less than 100   Symptomatic anemia   Essential hypertension, benign   History of laparoscopic adjustable gastric banding, 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   Anemia, iron deficiency   Atherosclerosis of native arteries of the extremities with ulceration (HCC)   Atrial fibrillation with RVR (HCC)   Barrett's esophagus with high grade dysplasia   Anticoagulated   Gastroesophageal reflux disease with esophagitis   Atrial fibrillation (HCC)   DM (diabetes mellitus) type II, controlled, with peripheral vascular disorder (HCC)   Spinal stenosis of lumbar region with neurogenic claudication   Severe episode of recurrent major depressive disorder, without psychotic features (HCC)   Chronic renal disease, stage 2, mildly decreased glomerular filtration rate (GFR) between 60-89 mL/min/1.73 square meter   Atrial flutter with rapid ventricular response (HCC)   Femoral-tibial bypass graft occlusion, right  (HCC)   PAD (peripheral artery disease) (HCC)   Melena   Antiplatelet or antithrombotic long-term use   Acute blood loss anemia    Discharge Instructions  Discharge Instructions     Diet - low sodium heart healthy   Complete by: As directed    Discharge instructions   Complete by: As directed    Follow with Primary MD Etta Grandchild, MD in 7 days   Get CBC, CMP,checked  by Primary MD next visit.    Activity: As tolerated with Full fall precautions use walker/cane & assistance as needed   Disposition Home   Diet: Heart Healthy    On your next visit with your primary care physician please Get Medicines reviewed and adjusted.   Please request your Prim.MD to go over all Hospital Tests and Procedure/Radiological results at the follow up, please get all Hospital records sent to your Prim MD by signing hospital release before you go home.   If you experience worsening of your admission symptoms, develop shortness of breath, life threatening emergency, suicidal or homicidal thoughts you must seek medical attention immediately by calling 911 or calling your MD immediately  if symptoms less severe.  You Must read complete instructions/literature along with all the possible adverse reactions/side effects for all the Medicines you take and that have been prescribed to you. Take any new Medicines after you have completely understood and accpet all the possible adverse reactions/side effects.   Do not drive, operating heavy machinery, perform activities at heights, swimming or participation in water activities or provide baby sitting services if your were admitted for syncope or siezures until you have seen by Primary MD or a Neurologist and advised to do so again.  Do not drive when taking Pain medications.    Do not take more  than prescribed Pain, Sleep and Anxiety Medications  Special Instructions: If you have smoked or chewed Tobacco  in the last 2 yrs please stop smoking, stop  any regular Alcohol  and or any Recreational drug use.  Wear Seat belts while driving.   Please note  You were cared for by a hospitalist during your hospital stay. If you have any questions about your discharge medications or the care you received while you were in the hospital after you are discharged, you can call the unit and asked to speak with the hospitalist on call if the hospitalist that took care of you is not available. Once you are discharged, your primary care physician will handle any further medical issues. Please note that NO REFILLS for any discharge medications will be authorized once you are discharged, as it is imperative that you return to your primary care physician (or establish a relationship with a primary care physician if you do not have one) for your aftercare needs so that they can reassess your need for medications and monitor your lab values.   Increase activity slowly   Complete by: As directed       Allergies as of 04/13/2023       Reactions   Amlodipine Swelling   Atorvastatin Other (See Comments)   Muscle aches    Ampicillin Nausea And Vomiting, Other (See Comments)   Codeine Nausea And Vomiting   Ask patient   Lisinopril Cough       Penicillins Nausea And Vomiting   Did it involve swelling of the face/tongue/throat, SOB, or low BP? No Did it involve sudden or severe rash/hives, skin peeling, or any reaction on the inside of your mouth or nose? No Did you need to seek medical attention at a hospital or doctor's office? No When did it last happen?      20+ years If all above answers are "NO", may proceed with cephalosporin use.   Januvia [sitagliptin] Nausea And Vomiting        Medication List     STOP taking these medications    clopidogrel 75 MG tablet Commonly known as: PLAVIX   diltiazem 180 MG 24 hr capsule Commonly known as: CARDIZEM CD   hydrALAZINE 50 MG tablet Commonly known as: APRESOLINE       TAKE these medications     aspirin EC 325 MG tablet Take 1 tablet (325 mg total) by mouth daily. Start taking on: April 14, 2023 What changed:  medication strength how much to take additional instructions   Basaglar KwikPen 100 UNIT/ML Inject 60 Units into the skin daily. What changed:  how much to take when to take this   bisacodyl 5 MG EC tablet Commonly known as: DULCOLAX Take 5 mg by mouth daily as needed for moderate constipation.   cetirizine 10 MG tablet Commonly known as: ZYRTEC Take 10 mg by mouth daily as needed for allergies.   COLACE PO Take 1 tablet by mouth daily as needed.   diltiazem 30 MG tablet Commonly known as: Cardizem Take 1 tablet every 4 hours AS NEEDED for AFIB heart rate >100 as long as top BP >100. What changed: Another medication with the same name was added. Make sure you understand how and when to take each.   diltiazem 60 MG 12 hr capsule Commonly known as: CARDIZEM SR Take 1 capsule (60 mg total) by mouth 2 (two) times daily. Hold for blood pressure less than 120, or heart rate less than 60 What  changed: You were already taking a medication with the same name, and this prescription was added. Make sure you understand how and when to take each.   dorzolamide-timolol 2-0.5 % ophthalmic solution Commonly known as: COSOPT Place 1 drop into the right eye 2 (two) times daily.   ezetimibe 10 MG tablet Commonly known as: ZETIA Take 1 tablet (10 mg total) by mouth daily.   HumaLOG KwikPen 100 UNIT/ML KwikPen Generic drug: insulin lispro DIAL 14 UNITS AND INJECT UNDER THE SKIN THREE TIMES A DAY. What changed: See the new instructions.   latanoprost 0.005 % ophthalmic solution Commonly known as: XALATAN Place 1 drop into both eyes at bedtime.   magnesium oxide 400 (240 Mg) MG tablet Commonly known as: MAGnesium-Oxide Take 1 tablet (400 mg total) by mouth daily.   nebivolol 10 MG tablet Commonly known as: BYSTOLIC Take 1 tablet by mouth once daily    ondansetron 4 MG tablet Commonly known as: Zofran Take 1 tablet (4 mg total) by mouth every 8 (eight) hours as needed for nausea or vomiting.   OneTouch Verio IQ System w/Device Kit USE TO CHECK SUGAR DAILY   OneTouch Verio test strip Generic drug: glucose blood USE TO check blood glucose TWICE DAILY   oxyCODONE 5 MG immediate release tablet Commonly known as: Oxy IR/ROXICODONE Take 1-2 tablets (5-10 mg total) by mouth every 4 (four) hours as needed for moderate pain (pain score 4-6).   Ozempic (1 MG/DOSE) 4 MG/3ML Sopn Generic drug: Semaglutide (1 MG/DOSE) Inject 1 mg into the skin once a week. Via [employer] patient assistance   pantoprazole 40 MG tablet Commonly known as: Protonix Take 1 tablet (40 mg total) by mouth daily. What changed: You were already taking a medication with the same name, and this prescription was added. Make sure you understand how and when to take each.   pantoprazole 40 MG tablet Commonly known as: PROTONIX Take 1 tablet (40 mg total) by mouth daily. What changed: Another medication with the same name was added. Make sure you understand how and when to take each.   Pen Needles 32G X 4 MM Misc Inject 1 pen  as directed 4 (four) times daily. Use to inject levemir or novolog as directed.   rosuvastatin 5 MG tablet Commonly known as: CRESTOR Take 1 tablet (5 mg total) by mouth daily.   Synjardy 5-500 MG Tabs Generic drug: Empagliflozin-metFORMIN HCl Take 1 tablet by mouth twice daily   Xarelto 20 MG Tabs tablet Generic drug: rivaroxaban Take 1 tablet by mouth once daily        Allergies  Allergen Reactions   Amlodipine Swelling   Atorvastatin Other (See Comments)    Muscle aches    Ampicillin Nausea And Vomiting and Other (See Comments)   Codeine Nausea And Vomiting    Ask patient   Lisinopril Cough        Penicillins Nausea And Vomiting    Did it involve swelling of the face/tongue/throat, SOB, or low BP? No Did it involve sudden  or severe rash/hives, skin peeling, or any reaction on the inside of your mouth or nose? No Did you need to seek medical attention at a hospital or doctor's office? No When did it last happen?      20+ years If all above answers are "NO", may proceed with cephalosporin use.    Januvia [Sitagliptin] Nausea And Vomiting    Consultations: ***Specify Physician/Group   Procedures/Studies: No results found. (Echo, Carotid, EGD, Colonoscopy, ERCP)  Subjective:   Discharge Exam: Vitals:   04/13/23 0100 04/13/23 0500  BP: (!) 131/54 106/84  Pulse:  (!) 56  Resp: 20 14  Temp: 97.7 F (36.5 C) 97.9 F (36.6 C)  SpO2: 100% 97%   Vitals:   04/12/23 1512 04/12/23 2034 04/13/23 0100 04/13/23 0500  BP: (!) 119/59 (!) 143/55 (!) 131/54 106/84  Pulse:  (!) 52  (!) 56  Resp: 18 16 20 14   Temp:  97.8 F (36.6 C) 97.7 F (36.5 C) 97.9 F (36.6 C)  TempSrc:  Oral Oral Tympanic  SpO2:   100% 97%  Weight:      Height:        General: Pt is alert, awake, not in acute distress Cardiovascular: RRR, S1/S2 +, no rubs, no gallops Respiratory: CTA bilaterally, no wheezing, no rhonchi Abdominal: Soft, NT, ND, bowel sounds + Extremities: no edema, no cyanosis    The results of significant diagnostics from this hospitalization (including imaging, microbiology, ancillary and laboratory) are listed below for reference.     Microbiology: No results found for this or any previous visit (from the past 240 hours).   Labs: BNP (last 3 results) Recent Labs    04/09/23 0443 04/10/23 0429 04/11/23 0521  BNP 134.5* 97.6 107.6*   Basic Metabolic Panel: Recent Labs  Lab 04/07/23 0607 04/08/23 0510 04/09/23 0443 04/10/23 0429 04/11/23 0521 04/12/23 0451  NA 139 139 137 138 140 138  K 4.3 3.7 3.8 3.8 3.4* 4.0  CL 110 112* 110 111 113* 111  CO2 21* 19* 19* 20* 22 22  GLUCOSE 114* 138* 123* 138* 97 174*  BUN 11 8 8 11 12  7*  CREATININE 0.90 0.92 0.88 0.87 0.77 0.73  CALCIUM 8.7*  8.9 8.5* 8.6* 8.8* 8.6*  MG 1.8 1.8 1.8 1.8 1.6*  --    Liver Function Tests: Recent Labs  Lab 04/06/23 1055 04/06/23 1514 04/07/23 0607  AST 8 14* 14*  ALT 8 11 11   ALKPHOS 62 52 50  BILITOT 0.3 0.4 0.8  PROT 6.5 5.9* 5.8*  ALBUMIN 3.8 3.1* 3.0*   No results for input(s): "LIPASE", "AMYLASE" in the last 168 hours. No results for input(s): "AMMONIA" in the last 168 hours. CBC: Recent Labs  Lab 04/08/23 0510 04/09/23 0443 04/09/23 1201 04/10/23 0429 04/10/23 1739 04/11/23 0521 04/12/23 0451 04/13/23 0749  WBC 8.2 8.7   < > 8.3 8.7 7.6 7.9 7.5  NEUTROABS 4.9 5.2  --  5.5  --  5.0 4.4  --   HGB 9.0* 7.6*   < > 7.1* 8.9* 8.1* 8.2* 9.3*  HCT 28.9* 24.9*   < > 22.8* 28.1* 26.0* 26.6* 30.8*  MCV 81.6 83.0   < > 81.1 80.7 81.0 81.1 83.2  PLT 339 310   < > 284 320 295 294 301   < > = values in this interval not displayed.   Cardiac Enzymes: No results for input(s): "CKTOTAL", "CKMB", "CKMBINDEX", "TROPONINI" in the last 168 hours. BNP: Invalid input(s): "POCBNP" CBG: Recent Labs  Lab 04/11/23 2120 04/12/23 0752 04/12/23 1208 04/12/23 1510 04/12/23 2146  GLUCAP 241* 143* 175* 237* 203*   D-Dimer No results for input(s): "DDIMER" in the last 72 hours. Hgb A1c No results for input(s): "HGBA1C" in the last 72 hours. Lipid Profile No results for input(s): "CHOL", "HDL", "LDLCALC", "TRIG", "CHOLHDL", "LDLDIRECT" in the last 72 hours. Thyroid function studies No results for input(s): "TSH", "T4TOTAL", "T3FREE", "THYROIDAB" in the last 72 hours.  Invalid input(s): "FREET3"  Anemia work up No results for input(s): "VITAMINB12", "FOLATE", "FERRITIN", "TIBC", "IRON", "RETICCTPCT" in the last 72 hours. Urinalysis    Component Value Date/Time   COLORURINE YELLOW 02/20/2023 1616   APPEARANCEUR CLEAR 02/20/2023 1616   LABSPEC 1.020 02/20/2023 1616   PHURINE 6.0 02/20/2023 1616   GLUCOSEU >=1000 (A) 02/20/2023 1616   HGBUR NEGATIVE 02/20/2023 1616   BILIRUBINUR NEGATIVE  02/20/2023 1616   BILIRUBINUR Negative 01/21/2020 1502   KETONESUR NEGATIVE 02/20/2023 1616   PROTEINUR Positive (A) 01/21/2020 1502   PROTEINUR NEGATIVE 06/14/2014 2235   UROBILINOGEN 1.0 02/20/2023 1616   NITRITE NEGATIVE 02/20/2023 1616   LEUKOCYTESUR NEGATIVE 02/20/2023 1616   Sepsis Labs Recent Labs  Lab 04/10/23 1739 04/11/23 0521 04/12/23 0451 04/13/23 0749  WBC 8.7 7.6 7.9 7.5   Microbiology No results found for this or any previous visit (from the past 240 hours).   Time coordinating discharge: Over 30 minutes  SIGNED:   Huey Bienenstock, MD  [facility] 04/13/2023, 10:15 AM Pager   If 7PM-7AM, please contact night-coverage [email or web address] Password [email or web address]

## 2023-04-13 NOTE — Progress Notes (Signed)
PHARMACY - ANTICOAGULATION CONSULT NOTE  Pharmacy Consult for heparin Indication: atrial fibrillation  Allergies  Allergen Reactions   Amlodipine Swelling   Atorvastatin Other (See Comments)    Muscle aches    Ampicillin Nausea And Vomiting and Other (See Comments)   Codeine Nausea And Vomiting    Ask patient   Lisinopril Cough        Penicillins Nausea And Vomiting    Did it involve swelling of the face/tongue/throat, SOB, or low BP? No Did it involve sudden or severe rash/hives, skin peeling, or any reaction on the inside of your mouth or nose? No Did you need to seek medical attention at a hospital or doctor's office? No When did it last happen?      20+ years If all above answers are "NO", may proceed with cephalosporin use.    Januvia [Sitagliptin] Nausea And Vomiting    Patient Measurements: Height: 5\' 4"  (162.6 cm) Weight: 76 kg (167 lb 8.8 oz) IBW/kg (Calculated) : 54.7 Heparin Dosing Weight: 71kg  Vital Signs: Temp: 97.9 F (36.6 C) (12/13 0500) Temp Source: Tympanic (12/13 0500) BP: 106/84 (12/13 0500) Pulse Rate: 56 (12/13 0500)  Labs: Recent Labs    04/10/23 1739 04/11/23 0521 04/11/23 1940 04/12/23 0451 04/13/23 0535  HGB 8.9* 8.1*  --  8.2*  --   HCT 28.1* 26.0*  --  26.6*  --   PLT 320 295  --  294  --   HEPARINUNFRC  --   --  0.16* 0.34 0.48  CREATININE  --  0.77  --  0.73  --     Estimated Creatinine Clearance: 69 mL/min (by C-G formula based on SCr of 0.73 mg/dL).   Medical History: Past Medical History:  Diagnosis Date   Allergy    Anxiety    Pt. denies   Arthritis    right index figer   Asymptomatic cholelithiasis    Atherosclerosis of aorta (HCC)    Atrial flutter, paroxysmal (HCC) 02/20/2023   Cataract    Clotting disorder (HCC)    Gallstones 06/2013   GERD (gastroesophageal reflux disease)    Glaucoma    "A LITTLE BIT"   History of kidney stones    lithotrispy   Hyperlipidemia    Hypertension    Hypothyroidism    no  meds   Iron deficiency anemia    PAD (peripheral artery disease) (HCC)    PAF (paroxysmal atrial fibrillation) (HCC)    Peripheral arterial occlusive disease (HCC)    lower extremities   Pneumonia    PONV (postoperative nausea and vomiting)    Right ureteral stone    Type 2 diabetes mellitus (HCC)    Type   Vitamin B 12 deficiency    Wears glasses      Assessment: 67 yo W on rivaroxaban PTA for afib, held for GIB work up, last dose 12/6. SB enteroscopy, capsule study and colonoscopy without active bleed, suspect small bowel source.  APTT on admission was 37, which is barely above normal. Pharmacy consulted for heparin.    12/13 AM: Heparin level 0.48, therapeutic on 1200 units/hr. No issues with infusion running or signs of bleeding noted per RN. CBC stable (Hgb 8.2, PLT 294 on 12/12).  Goal of Therapy:  Heparin level 0.3-0.7 units/ml Monitor platelets by anticoagulation protocol: Yes   Plan:  Continue heparin at 1200 units/hr    Monitor daily heparin level, CBC, signs/symptoms of bleeding  F/u restart rivaroxaban   Enos Fling, PharmD PGY-1 Acute  Care Pharmacy Resident 04/13/2023 7:36 AM  Please check AMION for all [facility] Pharmacy phone numbers After 10:00 PM, call Main Pharmacy [phone]

## 2023-04-13 NOTE — Telephone Encounter (Signed)
Mail pt's portion and fax Dr portion 04/13/23

## 2023-04-13 NOTE — Discharge Instructions (Signed)
Follow with Primary MD Jones, Thomas L, MD in 7 days   Get CBC, CMP, checked  by Primary MD next visit.    Activity: As tolerated with Full fall precautions use walker/cane & assistance as needed   Disposition Home    Diet: Heart Healthy   On your next visit with your primary care physician please Get Medicines reviewed and adjusted.   Please request your Prim.MD to go over all Hospital Tests and Procedure/Radiological results at the follow up, please get all Hospital records sent to your Prim MD by signing hospital release before you go home.   If you experience worsening of your admission symptoms, develop shortness of breath, life threatening emergency, suicidal or homicidal thoughts you must seek medical attention immediately by calling 911 or calling your MD immediately  if symptoms less severe.  You Must read complete instructions/literature along with all the possible adverse reactions/side effects for all the Medicines you take and that have been prescribed to you. Take any new Medicines after you have completely understood and accpet all the possible adverse reactions/side effects.   Do not drive, operating heavy machinery, perform activities at heights, swimming or participation in water activities or provide baby sitting services if your were admitted for syncope or siezures until you have seen by Primary MD or a Neurologist and advised to do so again.  Do not drive when taking Pain medications.    Do not take more than prescribed Pain, Sleep and Anxiety Medications  Special Instructions: If you have smoked or chewed Tobacco  in the last 2 yrs please stop smoking, stop any regular Alcohol  and or any Recreational drug use.  Wear Seat belts while driving.   Please note  You were cared for by a hospitalist during your hospital stay. If you have any questions about your discharge medications or the care you received while you were in the hospital after you are discharged,  you can call the unit and asked to speak with the hospitalist on call if the hospitalist that took care of you is not available. Once you are discharged, your primary care physician will handle any further medical issues. Please note that NO REFILLS for any discharge medications will be authorized once you are discharged, as it is imperative that you return to your primary care physician (or establish a relationship with a primary care physician if you do not have one) for your aftercare needs so that they can reassess your need for medications and monitor your lab values.  

## 2023-04-13 NOTE — Discharge Summary (Signed)
Physician Discharge Summary  Theresa Harrington NGE:[id number] DOB: 1955/10/27 DOA: 04/06/2023  PCP: Etta Grandchild, MD  Admit date: 04/06/2023 Discharge date: 04/13/2023  Admitted From: (Home) Disposition:  (Home)  Recommendations for Outpatient Follow-up:  Follow up with PCP in 1-2 weeks Please obtain BMP/CBC in one week Patient to follow with GI as an outpatient regarding repeat CBC and further recommendation about antiplatelet and anticoagulation therapy.   Diet recommendation: Heart Healthy / Carb Modified / renal  Brief/Interim Summary:  67 y.o. female with medical history significant of hypertension, hyperlipidemia, iron deficiency anemia, PAD, GERD, Barrett's esophagus, atrial fibrillation, diabetes, CKD 2, status post lap band, spinal stenosis, depression presenting with abnormal lab and symptomatic anemia.   Patient underwent femoropopliteal bypass on November 8 and has been doing well from that standpoint after the procedure.  She did, however, noticed some increasing fatigue and increasing dyspnea on exertion for the past week or so.  She was at follow-up visit with her PCP today and had lab work drawn which showed hemoglobin of 6.4 down from previous of 8-9.  She was sent to the ED for further evaluation. She does have intermittent dark stools for few days.  In the ER she was diagnosed with severe anemia and admitted for further workup.  Symptomatic iron deficiency anemia Chronic blood loss anemia melena acute >>intermittent upper GI bleed, versus intermittent occult GI bleed -She was on combination of aspirin, Plavix and Xarelto, all were held on admission -Required PRBC transfusion, - underwent EGD on 04/09/2023 which appears to be unremarkable, capsule endoscopy commenced on 04/09/2023 as well was unrevealing, she had COVID 04/11/2023 which was unrevealing as well, her bleed felt most likely in the setting of distal small bowel AVM.  [name] -See discussion below regarding DAPT and  anticoagulation -Hemoglobin has remained stable over last 3 days, out of over last 48 hours she has been on full dose aspirin 325 mg oral daily and full dose anticoagulation with heparin GTT, for which she has remained stable -Xarelto will be resumed on discharge as she tolerated heparin over last 48 hours, new full dose aspirin 325 mg oral daily, he is to be reassessed and reevaluated by gastroenterology as an outpatient, and if she is stable then can consider resuming Plavix, and decreasing her full dose aspirin to baby aspirin, to see if she tolerates that  -Continue with aspirin 40 mg p.o. twice daily.     Paroxysmal A-fib.  [place] vas 2 score of greater than 3.  Currently in RVR.    -Stable TSH and free T4, RVR likely due to the stress of anemia, on oral Cardizem and Lopressor, required IV Cardizem drip, and IV digoxin initially, she is currently with heart rate in the low 50s, so medication will be adjusted on discharge, to continue with home dose nebivolol, her Cardizem CD has been decreased to 60 mg of extended release twice daily think parameters in the setting of low low heart rate and soft blood pressure . -Xarelto has been held on admission due to above, she was kept on heparin gtt. over last 48 hours, which she tolerated with no evidence of progressive anemia or GI bleed, Xarelto will be continued on discharge, given her CHA2DS2-VASc score more than 3   PAD recent surgery for femoral-tibial bypass graft occlusion on the right side.   -Recent femoropopliteal bypass.  Aspirin Plavix and Xarelto held due to #1 above, continue statin and Zetia for secondary prevention.  Have discussed with vascular surgery, and GI,  for now we will keep on full dose aspirin 325 mg oral daily, and she is to be reassessed in 2 weeks and to challenge again with aspirin and Plavix for her vascular disease to see if she can tolerate as she is high risk for bypass graft occlusion, GI recommendations, what I think is very  appropriate, it is very difficult situation here, but I think if she remains with no evidence of GI bleed over the next 2 weeks, then it worked to challenge again with Plavix as she is at high risk for bypass graft thrombosis   Hypertension.  Continue beta-blocker and Cardizem as above.  Blood pressure has been soft, so hydralazine has been discontinued   GERD with history of Barrett's esophagus.   PPI twice daily   CKD stage II.  Stable.   DM type II.  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   Anemia, iron deficiency   Atherosclerosis of native arteries of the extremities with ulceration (HCC)   Atrial fibrillation with RVR (HCC)   Barrett's esophagus with high grade dysplasia   Anticoagulated   Gastroesophageal reflux disease with esophagitis   Atrial fibrillation (HCC)   DM (diabetes mellitus) type II, controlled, with peripheral vascular disorder (HCC)   Spinal stenosis of lumbar region with neurogenic claudication   Severe episode of recurrent major depressive disorder, without psychotic features (HCC)   Chronic renal disease, stage 2, mildly decreased glomerular filtration rate (GFR) between 60-89 mL/min/1.73 square meter   Atrial flutter with rapid ventricular response (HCC)   Femoral-tibial bypass graft occlusion, right (HCC)   PAD (peripheral artery disease) (HCC)   Melena   Antiplatelet or antithrombotic long-term use   Acute blood loss anemia    Discharge Instructions  Discharge Instructions     Diet - low sodium heart healthy   Complete by: As directed    Discharge instructions   Complete by: As directed    Follow with Primary MD Etta Grandchild, MD in 7 days   Get CBC, CMP,checked  by Primary MD next visit.    Activity: As  tolerated with Full fall precautions use walker/cane & assistance as needed   Disposition Home   Diet: Heart Healthy    On your next visit with your primary care physician please Get Medicines reviewed and adjusted.   Please request your Prim.MD to go over all Hospital Tests and Procedure/Radiological results at the follow up, please get all Hospital records sent to your Prim MD by signing hospital release before you go home.   If you experience worsening of your admission symptoms, develop shortness of breath, life threatening emergency, suicidal or homicidal thoughts you must seek medical attention immediately by calling 911 or calling your MD immediately  if symptoms less severe.  You Must read complete instructions/literature along with all the possible adverse reactions/side effects for all the Medicines you take and that have been prescribed to you. Take any new Medicines after you have completely understood and accpet all the possible adverse reactions/side effects.   Do not drive, operating heavy machinery, perform activities at heights, swimming or participation in water activities or provide baby sitting services if your were admitted for syncope or siezures until you have seen by Primary MD or a Neurologist and advised to  do so again.  Do not drive when taking Pain medications.    Do not take more than prescribed Pain, Sleep and Anxiety Medications  Special Instructions: If you have smoked or chewed Tobacco  in the last 2 yrs please stop smoking, stop any regular Alcohol  and or any Recreational drug use.  Wear Seat belts while driving.   Please note  You were cared for by a hospitalist during your hospital stay. If you have any questions about your discharge medications or the care you received while you were in the hospital after you are discharged, you can call the unit and asked to speak with the hospitalist on call if the hospitalist that took care of you is not  available. Once you are discharged, your primary care physician will handle any further medical issues. Please note that NO REFILLS for any discharge medications will be authorized once you are discharged, as it is imperative that you return to your primary care physician (or establish a relationship with a primary care physician if you do not have one) for your aftercare needs so that they can reassess your need for medications and monitor your lab values.   Increase activity slowly   Complete by: As directed       Allergies as of 04/13/2023       Reactions   Amlodipine Swelling   Atorvastatin Other (See Comments)   Muscle aches    Ampicillin Nausea And Vomiting, Other (See Comments)   Codeine Nausea And Vomiting   Ask patient   Lisinopril Cough       Penicillins Nausea And Vomiting   Did it involve swelling of the face/tongue/throat, SOB, or low BP? No Did it involve sudden or severe rash/hives, skin peeling, or any reaction on the inside of your mouth or nose? No Did you need to seek medical attention at a hospital or doctor's office? No When did it last happen?      20+ years If all above answers are "NO", may proceed with cephalosporin use.   Januvia [sitagliptin] Nausea And Vomiting        Medication List     STOP taking these medications    clopidogrel 75 MG tablet Commonly known as: PLAVIX   diltiazem 180 MG 24 hr capsule Commonly known as: CARDIZEM CD   hydrALAZINE 50 MG tablet Commonly known as: APRESOLINE       TAKE these medications    aspirin EC 325 MG tablet Take 1 tablet (325 mg total) by mouth daily. Start taking on: April 14, 2023 What changed:  medication strength how much to take additional instructions   Basaglar KwikPen 100 UNIT/ML Inject 60 Units into the skin daily. What changed:  how much to take when to take this   bisacodyl 5 MG EC tablet Commonly known as: DULCOLAX Take 5 mg by mouth daily as needed for moderate  constipation.   cetirizine 10 MG tablet Commonly known as: ZYRTEC Take 10 mg by mouth daily as needed for allergies.   COLACE PO Take 1 tablet by mouth daily as needed.   diltiazem 30 MG tablet Commonly known as: Cardizem Take 1 tablet every 4 hours AS NEEDED for AFIB heart rate >100 as long as top BP >100. What changed: Another medication with the same name was added. Make sure you understand how and when to take each.   diltiazem 60 MG 12 hr capsule Commonly known as: CARDIZEM SR Take 1 capsule (60 mg total) by mouth  2 (two) times daily. Hold for blood pressure less than 120, or heart rate less than 60 What changed: You were already taking a medication with the same name, and this prescription was added. Make sure you understand how and when to take each.   dorzolamide-timolol 2-0.5 % ophthalmic solution Commonly known as: COSOPT Place 1 drop into the right eye 2 (two) times daily.   ezetimibe 10 MG tablet Commonly known as: ZETIA Take 1 tablet (10 mg total) by mouth daily.   HumaLOG KwikPen 100 UNIT/ML KwikPen Generic drug: insulin lispro DIAL 14 UNITS AND INJECT UNDER THE SKIN THREE TIMES A DAY. What changed: See the new instructions.   latanoprost 0.005 % ophthalmic solution Commonly known as: XALATAN Place 1 drop into both eyes at bedtime.   magnesium oxide 400 (240 Mg) MG tablet Commonly known as: MAGnesium-Oxide Take 1 tablet (400 mg total) by mouth daily.   nebivolol 10 MG tablet Commonly known as: BYSTOLIC Take 1 tablet by mouth once daily   ondansetron 4 MG tablet Commonly known as: Zofran Take 1 tablet (4 mg total) by mouth every 8 (eight) hours as needed for nausea or vomiting.   OneTouch Verio IQ System w/Device Kit USE TO CHECK SUGAR DAILY   OneTouch Verio test strip Generic drug: glucose blood USE TO check blood glucose TWICE DAILY   oxyCODONE 5 MG immediate release tablet Commonly known as: Oxy IR/ROXICODONE Take 1-2 tablets (5-10 mg total) by  mouth every 4 (four) hours as needed for moderate pain (pain score 4-6).   Ozempic (1 MG/DOSE) 4 MG/3ML Sopn Generic drug: Semaglutide (1 MG/DOSE) Inject 1 mg into the skin once a week. Via [employer] patient assistance   pantoprazole 40 MG tablet Commonly known as: Protonix Take 1 tablet (40 mg total) by mouth 2 (two) times daily. What changed: when to take this   Pen Needles 32G X 4 MM Misc Inject 1 pen  as directed 4 (four) times daily. Use to inject levemir or novolog as directed.   rosuvastatin 5 MG tablet Commonly known as: CRESTOR Take 1 tablet (5 mg total) by mouth daily.   Synjardy 5-500 MG Tabs Generic drug: Empagliflozin-metFORMIN HCl Take 1 tablet by mouth twice daily   Xarelto 20 MG Tabs tablet Generic drug: rivaroxaban Take 1 tablet by mouth once daily        Follow-up Information     [facility] Follow up in 1 week(s).   Specialty: Gastroenterology Contact information: [address] [place] [address] [phone]               Allergies  Allergen Reactions   Amlodipine Swelling   Atorvastatin Other (See Comments)    Muscle aches    Ampicillin Nausea And Vomiting and Other (See Comments)   Codeine Nausea And Vomiting    Ask patient   Lisinopril Cough        Penicillins Nausea And Vomiting    Did it involve swelling of the face/tongue/throat, SOB, or low BP? No Did it involve sudden or severe rash/hives, skin peeling, or any reaction on the inside of your mouth or nose? No Did you need to seek medical attention at a hospital or doctor's office? No When did it last happen?      20+ years If all above answers are "NO", may proceed with cephalosporin use.    Januvia [Sitagliptin] Nausea And Vomiting    Consultations: Gastroenterology      Subjective:  No  BM since her colonoscopy, she denies any complaints, eager to go home today Discharge Exam: Vitals:   04/13/23 0500 04/13/23 1100   BP: 106/84 (!) 123/49  Pulse: (!) 56   Resp: 14   Temp: 97.9 F (36.6 C)   SpO2: 97%    Vitals:   04/12/23 2034 04/13/23 0100 04/13/23 0500 04/13/23 1100  BP: (!) 143/55 (!) 131/54 106/84 (!) 123/49  Pulse: (!) 52  (!) 56   Resp: 16 20 14    Temp: 97.8 F (36.6 C) 97.7 F (36.5 C) 97.9 F (36.6 C)   TempSrc: Oral Oral Tympanic   SpO2:  100% 97%   Weight:      Height:        General: Pt is alert, awake, not in acute distress Cardiovascular: RRR, S1/S2 +, no rubs, no gallops Respiratory: CTA bilaterally, no wheezing, no rhonchi Abdominal: Soft, NT, ND, bowel sounds + Extremities: no edema, no cyanosis    The results of significant diagnostics from this hospitalization (including imaging, microbiology, ancillary and laboratory) are listed below for reference.     Microbiology: No results found for this or any previous visit (from the past 240 hours).   Labs: BNP (last 3 results) Recent Labs    04/09/23 0443 04/10/23 0429 04/11/23 0521  BNP 134.5* 97.6 107.6*   Basic Metabolic Panel: Recent Labs  Lab 04/07/23 0607 04/08/23 0510 04/09/23 0443 04/10/23 0429 04/11/23 0521 04/12/23 0451  NA 139 139 137 138 140 138  K 4.3 3.7 3.8 3.8 3.4* 4.0  CL 110 112* 110 111 113* 111  CO2 21* 19* 19* 20* 22 22  GLUCOSE 114* 138* 123* 138* 97 174*  BUN 11 8 8 11 12  7*  CREATININE 0.90 0.92 0.88 0.87 0.77 0.73  CALCIUM 8.7* 8.9 8.5* 8.6* 8.8* 8.6*  MG 1.8 1.8 1.8 1.8 1.6*  --    Liver Function Tests: Recent Labs  Lab 04/07/23 0607  AST 14*  ALT 11  ALKPHOS 50  BILITOT 0.8  PROT 5.8*  ALBUMIN 3.0*   No results for input(s): "LIPASE", "AMYLASE" in the last 168 hours. No results for input(s): "AMMONIA" in the last 168 hours. CBC: Recent Labs  Lab 04/08/23 0510 04/09/23 0443 04/09/23 1201 04/10/23 0429 04/10/23 1739 04/11/23 0521 04/12/23 0451 04/13/23 0749  WBC 8.2 8.7   < > 8.3 8.7 7.6 7.9 7.5  NEUTROABS 4.9 5.2  --  5.5  --  5.0 4.4  --   HGB 9.0*  7.6*   < > 7.1* 8.9* 8.1* 8.2* 9.3*  HCT 28.9* 24.9*   < > 22.8* 28.1* 26.0* 26.6* 30.8*  MCV 81.6 83.0   < > 81.1 80.7 81.0 81.1 83.2  PLT 339 310   < > 284 320 295 294 301   < > = values in this interval not displayed.   Cardiac Enzymes: No results for input(s): "CKTOTAL", "CKMB", "CKMBINDEX", "TROPONINI" in the last 168 hours. BNP: Invalid input(s): "POCBNP" CBG: Recent Labs  Lab 04/11/23 2120 04/12/23 0752 04/12/23 1208 04/12/23 1510 04/12/23 2146  GLUCAP 241* 143* 175* 237* 203*   D-Dimer No results for input(s): "DDIMER" in the last 72 hours. Hgb A1c No results for input(s): "HGBA1C" in the last 72 hours. Lipid Profile No results for input(s): "CHOL", "HDL", "LDLCALC", "TRIG", "CHOLHDL", "LDLDIRECT" in the last 72 hours. Thyroid function studies No results for input(s): "TSH", "T4TOTAL", "T3FREE", "THYROIDAB" in the last 72 hours.  Invalid input(s): "FREET3" Anemia work up No results for input(s): "VITAMINB12", "  FOLATE", "FERRITIN", "TIBC", "IRON", "RETICCTPCT" in the last 72 hours. Urinalysis    Component Value Date/Time   COLORURINE YELLOW 02/20/2023 1616   APPEARANCEUR CLEAR 02/20/2023 1616   LABSPEC 1.020 02/20/2023 1616   PHURINE 6.0 02/20/2023 1616   GLUCOSEU >=1000 (A) 02/20/2023 1616   HGBUR NEGATIVE 02/20/2023 1616   BILIRUBINUR NEGATIVE 02/20/2023 1616   BILIRUBINUR Negative 01/21/2020 1502   KETONESUR NEGATIVE 02/20/2023 1616   PROTEINUR Positive (A) 01/21/2020 1502   PROTEINUR NEGATIVE 06/14/2014 2235   UROBILINOGEN 1.0 02/20/2023 1616   NITRITE NEGATIVE 02/20/2023 1616   LEUKOCYTESUR NEGATIVE 02/20/2023 1616   Sepsis Labs Recent Labs  Lab 04/10/23 1739 04/11/23 0521 04/12/23 0451 04/13/23 0749  WBC 8.7 7.6 7.9 7.5   Microbiology No results found for this or any previous visit (from the past 240 hours).   Time coordinating discharge: Over 30 minutes  SIGNED:   Huey Bienenstock, MD  [facility] 04/13/2023, 5:45 PM Pager    If 7PM-7AM, please contact night-coverage [email or web address] Password [email or web address]

## 2023-04-13 NOTE — Progress Notes (Signed)
Discharged to home, AVS reviewed and patient verbalized understanding. IV's removed, site clean dry and intact. Patient will schedule follow-up appointments. Volunteer assisted patient off the floor.

## 2023-04-13 NOTE — TOC Transition Note (Signed)
Transition of Care [facility]) - Discharge Note   Patient Details  Name: Theresa Harrington MRN: [id number] Date of Birth: [birth year]  [facility] CM/SW Contact:  Gordy Clement, RN Phone Number: 04/13/2023, 11:39 AM   Clinical Narrative:     Patient to DC to home today  No TOC needs identified  Family to transport          Patient Goals and CMS Choice            Discharge Placement                       Discharge Plan and Services Additional resources added to the After Visit Summary for                                       Social Drivers of Health (SDOH) Interventions SDOH Screenings   Food Insecurity: No Food Insecurity (04/06/2023)  Housing: Low Risk  (04/06/2023)  Transportation Needs: No Transportation Needs (04/06/2023)  Utilities: Not At Risk (04/06/2023)  Alcohol Screen: Low Risk  (12/07/2021)  Depression (PHQ2-9): Low Risk  (04/06/2023)  Financial Resource Strain: Low Risk  (12/07/2021)  Physical Activity: Inactive (12/07/2021)  Social Connections: Moderately Integrated (12/07/2021)  Stress: No Stress Concern Present (12/07/2021)  Tobacco Use: Medium Risk (04/09/2023)  Health Literacy: Adequate Health Literacy (03/14/2023)     Readmission Risk Interventions    03/12/2023   10:10 AM  Readmission Risk Prevention Plan  Transportation Screening Complete  Home Care Screening Complete  Medication Review (RN CM) Complete

## 2023-04-13 NOTE — Telephone Encounter (Signed)
Theresa Harrington, This patient will be discharged from the hospital on 04/13/2023, and she needs a CBC at our office next Wednesday, 04/18/2023

## 2023-04-13 NOTE — Progress Notes (Signed)
    Progress Note   Subjective  Chief Complaint: Melena, acute on chronic iron deficiency anemia  Pill capsule endoscopy 04/09/2023 and enteroscopy unrevealing  Colonoscopy 04/11/2023 with small lipoma in the proximal ascending colon diverticulosis in the left colon  Patient given a heparin trial overnight and has had no further bloody bowel movements.  Rains for discharge today.   Objective   Vital signs in last 24 hours: Temp:  [97.7 F (36.5 C)-97.9 F (36.6 C)] 97.9 F (36.6 C) (12/13 0500) Pulse Rate:  [52-56] 56 (12/13 0500) Resp:  [14-20] 14 (12/13 0500) BP: (106-143)/(54-84) 106/84 (12/13 0500) SpO2:  [97 %-100 %] 97 % (12/13 0500) Last BM Date : 04/10/23 General:  female in NAD Heart:  Regular rate and rhythm; no murmurs Lungs: Respirations even and unlabored, lungs CTA bilaterally Abdomen:  Soft, nontender and nondistended. Normal bowel sounds. Psych:  Cooperative. Normal mood and affect.  Intake/Output from previous day: 12/12 0701 - 12/13 0700 In: 546.8 [P.O.:240; I.V.:306.8] Out: -    Lab Results: Recent Labs    04/11/23 0521 04/12/23 0451 04/13/23 0749  WBC 7.6 7.9 7.5  HGB 8.1* 8.2* 9.3*  HCT 26.0* 26.6* 30.8*  PLT 295 294 301   BMET Recent Labs    04/11/23 0521 04/12/23 0451  NA 140 138  K 3.4* 4.0  CL 113* 111  CO2 22 22  GLUCOSE 97 174*  BUN 12 7*  CREATININE 0.77 0.73  CALCIUM 8.8* 8.6*     Assessment / Plan:   Assessment: 1.  Melena 2.  Symptomatic iron deficiency anemia: Full workup this hospitalization, likely from distal small bowel AVMs 3.  Chronic anticoagulation for A-fib and recent vascular surgery on Plavix and Xarelto as well as Aspirin  Plan: 1.  Okay with patient discharge home today. 2.  Agree with restarting Xarelto and Aspirin, hold Plavix for at least the next 2 weeks. 3.  Discussed with patient we will recheck her CBC in 1 week, she can have this drawn at outpatient lab.  Explained this to her today. 4.  Okay  with continuing Pantoprazole 40 mg twice daily for now 5.  Again patient is to contact our clinic and let us know how she is doing over the next couple of weeks, if no recurrence of bleeding then consideration should be given to resuming her Plavix per recommendations from vascular surgeon  Thank you for your kind consultation, we will sign off.   LOS: 6 days   Unk Lightning  04/13/2023, 10:57 AM

## 2023-04-16 ENCOUNTER — Telehealth: Payer: Self-pay | Admitting: *Deleted

## 2023-04-16 ENCOUNTER — Other Ambulatory Visit: Payer: Self-pay

## 2023-04-16 DIAGNOSIS — D509 Iron deficiency anemia, unspecified: Secondary | ICD-10-CM

## 2023-04-16 NOTE — Telephone Encounter (Signed)
Pt made aware of Theresa Meeker PA recommendation: Order for lab placed in Epic. Pt made aware to come on 04/18/2023 Pt verbalized understanding with all questions answered.

## 2023-04-16 NOTE — Patient Instructions (Signed)
Visit Information  Thank you for taking time to visit with me today. Please don't hesitate to contact me if I can be of assistance to you before our next scheduled telephone appointment.  Our next appointment is by telephone on Tuesday 04/24/23 at 09:45 am  Please call the care guide team at [phone] if you need to cancel or reschedule your appointment.   Patient Goals/Self-Care Activities: Participate in Transition of Care Program/Attend TOC scheduled calls Take all medications as prescribed Attend all scheduled provider appointments Perform all self care activities independently  Call provider office for new concerns or questions  Continue pacing activity as your recuperation from recent hospitalization continues Continue monitoring and recording your blood sugars and blood pressures at home, and take these values with you to your upcoming doctor appointment on 04/23/23  Following is a copy of your care plan:   Goals Addressed             This Visit's Progress    COMPLETED: TOC 30-day Program Care Plan       Current Barriers:  Recent surgery for re-vascularization of (R) fem tib with (R) great toe wound; lives alone/ independent at baseline  Experienced hospital re-admission 12/06-13, 2024: existing care plan completed/ modified/ re-created  RNCM Clinical Goal(s):  Patient will work with the Care Management team over the next 30 days to address Transition of Care Barriers: prevention of hospital readmission in independent patient who lives alone take all medications exactly as prescribed and will call provider for medication related questions as evidenced by patient reporting of same during [facility] 30-day program outreaches attend all scheduled medical appointments: 04/02/23- [facility] surgical provider as evidenced by review of same during [facility] 30-day program outreaches work with home health team ([facility]) to facilitate potential needs post-hospital discharge on 03/13/23 as evidenced  by collaboration as indicated and patient reporting during TOC 30-day program outreaches not experience hospital admission as evidenced by review of EMR. Hospital Admissions in last 6 months = 1- planned/ elective surgery  through collaboration with RN Care manager, provider, and care team 03/21/23: Continue working with the pharmacist from the PCP office- currently established in care to facilitate PAP for Basaglar and Synjardy: confirmed pharmacist is actively involved and patient has been approved for PAP for both medications: awaiting shipment/ arrival of [facility] Candy Sledge [phone] 03/28/23: reports still awaiting delivery of Synjardy- reports she has been approved for PAP and has received letter of confirmation with contact information for drug supplier Pharmacist Candy Sledge [phone] 04/05/23: confirmed patient received and is taking Synjardy from PAP drug manufacturer  Interventions: Evaluation of current treatment plan related to  self management and patient's adherence to plan as established by provider  Transitions of Care:  Goal on track:  Yes. 04/05/23 Doctor Visits  - discussed the importance of doctor visits Post-op wound/incision care reviewed with patient/caregiver Confirmed continues no longer requiring use of rolling walker- but has if needed--- reports again she is independent in ambulation and "feels very steady" with ambulation Discussed current clinical condition: reports "I am doing great, just have low energy; the surgeon told me on 04/02/23 that I can return to work on 12/9.... I am still trying to decide if I am going to go back or wait awhile... still driving myself wherever I need to go.  There were no changes made to my medicines or overall plan when I saw the surgeon on 04/02/23, so I think I am doing just like they want me to  and healing well;" denies specific clinical concerns today Confirmed patient continues monitoring blood sugars at  home BID Reviewed recent blood sugars at home: patient reports again today, morning ranges between 90-120 with this morning value of "98"; pm ranges between 130-170  Reinforced blood sugar management at home; need to follow carbohydrate modified diet; use of short- and long- acting insulin Confirmed home health services have signed off and are no longer active  Patient Goals/Self-Care Activities: Participate in Transition of Care Program/Attend TOC scheduled calls Take all medications as prescribed Attend all scheduled provider appointments Call pharmacy for medication refills 3-7 days in advance of running out of medications Call provider office for new concerns or questions  Continue pacing activity as your recuperation from recent surgery continues Use assistive devices as/ if needed to prevent falls If you believe your condition is getting worse or you have concerns around your condition- contact your care providers (doctors) promptly- reaching out to your doctor early when you have concerns can prevent you from having to go to the hospital Keep up the great work monitoring and writing down your blood sugars at home twice a day Continue following a carbohydrate modified, low sugar, heart healthy/ low salt diet  Follow Up Plan:  Telephone follow up appointment with care management team member scheduled for:  Friday, 04/13/23 at 9:45 am       [facility] 30-day Program Care Plan   On track    Current Barriers:  Provider appointments Need to ensure appropriate follow up with providers post-recent hospital discharge on 04/13/23 Hospital Re-admission with 30-days of last discharge Patient lives alone/ independent in all aspects of self-care/ medication administration; has resumed working full time: unsure she wishes to complete another TOC 3-day program series, now that she has returned to work: she is agreeable to "at least one more call next week"  RNCM Clinical Goal(s):  Patient will work with  the Care Management team over the next 30 days to address Transition of Care Barriers: Provider appointments General support after 3 recent hospitalizations take all medications exactly as prescribed and will call provider for medication related questions as evidenced by patient reporting during extension of TOC 30-day program outreaches with RN CM  attend all scheduled medical appointments: 04/23/23  PCP as evidenced by review of same in EHR and with patient during TOC 30-day program outreaches not experience hospital admission as evidenced by review of EMR. Hospital Admissions in last 6 months = 2  through collaboration with RN Care manager, provider, and care team.   Interventions: Evaluation of current treatment plan related to  self management and patient's adherence to plan as established by provider  Transitions of Care:  New goal. 04/16/23: prior [facility] 30-day program care plan completed with development of new care plan Durable Medical Equipment (DME) needs assessed with patient/caregiver Doctor Visits  - discussed the importance of doctor visits Arranged PCP follow-up within 12-14 days (Care Guide Scheduled) Care coordination outreach in real-time with scheduling care guide to successfully schedule hospital follow up PCP appointment 04/23/23 Discussed current clinical condition:  "I am fine; doing good and don't have any questions; I am back at work.  Still managing all of my own medications and still completely independent."  Denies specific clinical concerns today Confirmed not currently requiring/ using assistive devices for ambulation- reports feels steady on feet with ambulation; reports back at work Provided education around benefit of conservative post-hospital discharge activity; need to pace activity without over-doing  Confirmed patient continues monitoring  blood sugars at home- unable to review- patient is back at work and is currently working during [facility] call Confirmed patient  continues to take BP's at home: she reports "Seemed a little high today, but they stopped one of my blood pressure medications;" she is at work and is not near her recorded blood pressures from home: encouraged patient to continue monitoring/ recording BP's at home and to take recorded values to upcoming PCP appointment on 04/23/23  Patient Goals/Self-Care Activities: Participate in Transition of Care Program/Attend [facility] scheduled calls Take all medications as prescribed Attend all scheduled provider appointments Perform all self care activities independently  Call provider office for new concerns or questions  Continue pacing activity as your recuperation from recent hospitalization continues Continue monitoring and recording your blood sugars and blood pressures at home, and take these values with you to your upcoming doctor appointment on 04/23/23  Follow Up Plan:  Telephone follow up appointment with care management team member scheduled for:  Tuesday 04/24/23 at 09:45 am         Patient verbalizes understanding of instructions and care plan provided today and agrees to view in MyChart. Active MyChart status and patient understanding of how to access instructions and care plan via MyChart confirmed with patient.     If you are experiencing a Mental Health or Behavioral Health Crisis or need someone to talk to, please  call the Suicide and Crisis Lifeline: 988 call the [place] National Suicide Prevention Lifeline: 785-314-4620 or TTY: 5197808200 TTY 405-568-5366) to talk to a trained counselor call 1-800-273-TALK (toll free, 24 hour hotline) go to [facility] [address] [place] [phone]) call the [facility] Crisis Line: [phone] call 911   Caryl Pina, RN, BSN, CCRN Alumnus RN Care Manager  Transitions of Care  [facility] - Population Health  [place] [phone]: direct office

## 2023-04-16 NOTE — Transitions of Care (Post Inpatient/ED Visit) (Signed)
04/16/2023  Name: Theresa Harrington MRN: [id number] DOB: April 14, 1956  Today's TOC FU Call Status: Today's TOC FU Call Status:: Successful TOC FU Call Completed TOC FU Call Complete Date: 04/16/23 Patient's Name and Date of Birth confirmed.  Transition Care Management Follow-up Telephone Call Date of Discharge: 04/13/23 Discharge Facility: [facility]) Type of Discharge: Inpatient Admission Primary Inpatient Discharge Diagnosis:: Acute GI bleed requiring PRBC transfusion How have you been since you were released from the hospital?: Better ("I am better; I am back at work now.  I think I have got everything straight, but you can call me next week to check in, and we'll go from there.... I am not sure I need or want these weekly calls now that I am back at work") Any questions or concerns?: No  Items Reviewed: Did you receive and understand the discharge instructions provided?: Yes (thoroughly reviewed with patient who verbalizes good understanding of same) Medications obtained,verified, and reconciled?: Yes (Medications Reviewed) (Full medication reconciliation/ review completed; no concerns or discrepancies identified; confirmed patient obtained/ is taking all newly Rx'd medications as instructed; self-manages medications and denies questions/ concerns around medications today) Any new allergies since your discharge?: No Dietary orders reviewed?: Yes Type of Diet Ordered:: "Healthy as possible; low salt; trying to eat non-spicy foods" Do you have support at home?: Yes People in Home: alone Name of Support/Comfort Primary Source: Reports independent in self-care activities; friend/ family assists as/ if needed/ indicated  Medications Reviewed Today: Medications Reviewed Today     Reviewed by Michaela Corner, RN (Registered Nurse) on 04/16/23 at 1005  Med List Status: <None>   Medication Order Taking? Sig Documenting Provider Last Dose Status Informant  aspirin EC 325 MG tablet [id number]  Yes Take 1 tablet (325 mg total) by mouth daily. Elgergawy, Leana Roe, MD Taking Active   bisacodyl (DULCOLAX) 5 MG EC tablet [id number] Yes Take 5 mg by mouth daily as needed for moderate constipation. [provider] Taking Active Self, Pharmacy Records  Blood Glucose Monitoring Suppl [facility] VERIO IQ SYSTEM) w/Device KIT [id number] Yes USE TO CHECK SUGAR DAILY Etta Grandchild, MD Taking Active Self, Pharmacy Records  cetirizine (ZYRTEC) 10 MG tablet [id number] Yes Take 10 mg by mouth daily as needed for allergies. [provider] Taking Active Self, Pharmacy Records           Med Note Yevette Edwards   Fri Apr 06, 2023  7:20 PM)    diltiazem (CARDIZEM SR) 60 MG 12 hr capsule [id number] Yes Take 1 capsule (60 mg total) by mouth 2 (two) times daily. Hold for blood pressure less than 120, or heart rate less than 60 Elgergawy, Leana Roe, MD Taking Active   diltiazem (CARDIZEM) 30 MG tablet [id number] Yes Take 1 tablet every 4 hours AS NEEDED for AFIB heart rate >100 as long as top BP >100. Jodelle Gross, NP Taking Active Self, Pharmacy Records  Docusate Sodium (COLACE PO) [id number] Yes Take 1 tablet by mouth daily as needed. [provider] Taking Active Self, Pharmacy Records  dorzolamide-timolol (COSOPT) 22.3-6.8 MG/ML ophthalmic solution [id number] Yes Place 1 drop into the right eye 2 (two) times daily.  [provider] Taking Active Self, Pharmacy Records           Med Note Yevette Edwards   Fri Apr 06, 2023  7:26 PM)    ezetimibe (ZETIA) 10 MG tablet [id number] Yes Take 1 tablet (10 mg total) by mouth daily. Etta Grandchild, MD  Taking Active Self, Pharmacy Records  HUMALOG KWIKPEN 100 UNIT/ML KwikPen [id number] Yes DIAL 14 UNITS AND INJECT UNDER THE SKIN THREE TIMES A DAY.  Patient taking differently: Inject 14 Units into the skin 3 (three) times daily as needed.   Etta Grandchild, MD Taking Active Self, Pharmacy Records  Insulin Glargine  [facility]) 100 UNIT/ML [id number] Yes Inject 60 Units into the skin daily.  Patient taking differently: Inject 50 Units into the skin at bedtime.   Etta Grandchild, MD Taking Active Self, Pharmacy Records           Med Note Marilu Favre Apr 16, 2023  9:46 AM) 04/16/23: Reports during TOC call--- taking 50 U every day   Insulin Pen Needle (PEN NEEDLES) 32G X 4 MM MISC [id number] Yes Inject 1 pen  as directed 4 (four) times daily. Use to inject levemir or novolog as directed. Etta Grandchild, MD Taking Active Self, Pharmacy Records  latanoprost (XALATAN) 0.005 % ophthalmic solution [id number] Yes Place 1 drop into both eyes at bedtime.  [provider] Taking Active Self, Pharmacy Records           Med Note Yevette Edwards   Fri Apr 06, 2023  7:20 PM)    MAGnesium-Oxide 400 (240 Mg) MG tablet [id number] Yes Take 1 tablet (400 mg total) by mouth daily. Etta Grandchild, MD Taking Active Self, Pharmacy Records  nebivolol (BYSTOLIC) 10 MG tablet [id number] Yes Take 1 tablet by mouth once daily Jake Bathe, MD Taking Active Self, Pharmacy Records  ondansetron [facility]) 4 MG tablet [id number] Yes Take 1 tablet (4 mg total) by mouth every 8 (eight) hours as needed for nausea or vomiting. Lars Mage, PA-C Taking Active Self, Pharmacy Records           Med Note Yevette Edwards   Fri Apr 06, 2023  7:20 PM)    [facility] VERIO test strip [id number] Yes USE TO check blood glucose TWICE DAILY Etta Grandchild, MD Taking Active Self, Pharmacy Records  oxyCODONE (OXY IR/ROXICODONE) 5 MG immediate release tablet [id number] Yes Take 1-2 tablets (5-10 mg total) by mouth every 4 (four) hours as needed for moderate pain (pain score 4-6). Lars Mage, PA-C Taking Active Self, Pharmacy Records           Med Note Yevette Edwards   Fri Apr 06, 2023  7:20 PM)    pantoprazole (PROTONIX) 40 MG tablet [id number] Yes Take 1 tablet (40 mg total) by mouth 2 (two) times daily.  Elgergawy, Leana Roe, MD Taking Active   rosuvastatin (CRESTOR) 5 MG tablet [id number] Yes Take 1 tablet (5 mg total) by mouth daily. Jake Bathe, MD Taking Active Self, Pharmacy Records  Semaglutide, 1 MG/DOSE, (OZEMPIC, 1 MG/DOSE,) 4 MG/3ML SOPN [id number] Yes Inject 1 mg into the skin once a week. Via [employer] patient assistance Etta Grandchild, MD Taking Active Self, Pharmacy Records           Med Note Yevette Edwards   Fri Apr 06, 2023  7:28 PM) On Sundays  [facility] 5-500 MG TABS [id number] Yes Take 1 tablet by mouth twice daily Etta Grandchild, MD Taking Active Self, Pharmacy Records           Med Note Yevette Edwards   Fri Apr 06, 2023  7:20 PM)    Carlena Hurl 20 MG TABS tablet [id number] Yes Take 1 tablet by mouth once daily Jones,  Bernadene Bell, MD Taking Active Self, Pharmacy Records            Home Care and Equipment/Supplies: Were Home Health Services Ordered?: No Any new equipment or medical supplies ordered?: No  Functional Questionnaire: Do you need assistance with bathing/showering or dressing?: No Do you need assistance with meal preparation?: No Do you need assistance with eating?: No Do you have difficulty maintaining continence: No Do you need assistance with getting out of bed/getting out of a chair/moving?: No Do you have difficulty managing or taking your medications?: No  Follow up appointments reviewed: PCP Follow-up appointment confirmed?: Yes Date of PCP follow-up appointment?: 04/23/23 (care coordination outreach in real-time with scheduling care guide to successfully schedule hospital follow up PCP appointment) Follow-up Provider: PCP- Dr. Yetta Barre Specialist [facility] Follow-up appointment confirmed?: No Reason Specialist Follow-Up Not Confirmed: Patient has Specialist Provider Number and will Call for Appointment Do you need transportation to your follow-up appointment?: No Do you understand care options if your condition(s) worsen?: Yes-patient  verbalized understanding  SDOH Interventions Today    Flowsheet Row Most Recent Value  SDOH Interventions   Food Insecurity Interventions Intervention Not Indicated  Housing Interventions Intervention Not Indicated  Transportation Interventions Intervention Not Indicated  [has resumed driving self- confirms now back at work]  Utilities Interventions Intervention Not Indicated  Health Literacy Interventions Intervention Not Indicated       Goals Addressed             This Visit's Progress    COMPLETED: TOC 30-day Program Care Plan       Current Barriers:  Recent surgery for re-vascularization of (R) fem tib with (R) great toe wound; lives alone/ independent at baseline  Experienced hospital re-admission 12/06-13, 2024: existing care plan completed/ modified/ re-created  RNCM Clinical Goal(s):  Patient will work with the Care Management team over the next 30 days to address Transition of Care Barriers: prevention of hospital readmission in independent patient who lives alone take all medications exactly as prescribed and will call provider for medication related questions as evidenced by patient reporting of same during [facility] 30-day program outreaches attend all scheduled medical appointments: 04/02/23- [facility] surgical provider as evidenced by review of same during [facility] 30-day program outreaches work with home health team ([facility]) to facilitate potential needs post-hospital discharge on 03/13/23 as evidenced by collaboration as indicated and patient reporting during TOC 30-day program outreaches not experience hospital admission as evidenced by review of EMR. Hospital Admissions in last 6 months = 1- planned/ elective surgery  through collaboration with RN Care manager, provider, and care team 03/21/23: Continue working with the pharmacist from the PCP office- currently established in care to facilitate PAP for Basaglar and Synjardy: confirmed pharmacist is actively involved and patient  has been approved for PAP for both medications: awaiting shipment/ arrival of [facility] Candy Sledge [phone] 03/28/23: reports still awaiting delivery of Synjardy- reports she has been approved for PAP and has received letter of confirmation with contact information for drug supplier Pharmacist Candy Sledge [phone] 04/05/23: confirmed patient received and is taking Synjardy from PAP drug manufacturer  Interventions: Evaluation of current treatment plan related to  self management and patient's adherence to plan as established by provider  Transitions of Care:  Goal on track:  Yes. 04/05/23 Doctor Visits  - discussed the importance of doctor visits Post-op wound/incision care reviewed with patient/caregiver Confirmed continues no longer requiring use of rolling walker- but has if needed--- reports again she is independent  in ambulation and "feels very steady" with ambulation Discussed current clinical condition: reports "I am doing great, just have low energy; the surgeon told me on 04/02/23 that I can return to work on 12/9.... I am still trying to decide if I am going to go back or wait awhile... still driving myself wherever I need to go.  There were no changes made to my medicines or overall plan when I saw the surgeon on 04/02/23, so I think I am doing just like they want me to and healing well;" denies specific clinical concerns today Confirmed patient continues monitoring blood sugars at home BID Reviewed recent blood sugars at home: patient reports again today, morning ranges between 90-120 with this morning value of "98"; pm ranges between 130-170  Reinforced blood sugar management at home; need to follow carbohydrate modified diet; use of short- and long- acting insulin Confirmed home health services have signed off and are no longer active  Patient Goals/Self-Care Activities: Participate in Transition of Care Program/Attend TOC scheduled calls Take all  medications as prescribed Attend all scheduled provider appointments Call pharmacy for medication refills 3-7 days in advance of running out of medications Call provider office for new concerns or questions  Continue pacing activity as your recuperation from recent surgery continues Use assistive devices as/ if needed to prevent falls If you believe your condition is getting worse or you have concerns around your condition- contact your care providers (doctors) promptly- reaching out to your doctor early when you have concerns can prevent you from having to go to the hospital Keep up the great work monitoring and writing down your blood sugars at home twice a day Continue following a carbohydrate modified, low sugar, heart healthy/ low salt diet  Follow Up Plan:  Telephone follow up appointment with care management team member scheduled for:  Friday, 04/13/23 at 9:45 am       [facility] 30-day Program Care Plan   On track    Current Barriers:  Provider appointments Need to ensure appropriate follow up with providers post-recent hospital discharge on 04/13/23 Hospital Re-admission with 30-days of last discharge Patient lives alone/ independent in all aspects of self-care/ medication administration; has resumed working full time: unsure she wishes to complete another TOC 3-day program series, now that she has returned to work: she is agreeable to "at least one more call next week"  RNCM Clinical Goal(s):  Patient will work with the Care Management team over the next 30 days to address Transition of Care Barriers: Provider appointments General support after 3 recent hospitalizations take all medications exactly as prescribed and will call provider for medication related questions as evidenced by patient reporting during extension of TOC 30-day program outreaches with RN CM  attend all scheduled medical appointments: 04/23/23  PCP as evidenced by review of same in EHR and with patient during TOC 30-day  program outreaches not experience hospital admission as evidenced by review of EMR. Hospital Admissions in last 6 months = 2  through collaboration with RN Care manager, provider, and care team.   Interventions: Evaluation of current treatment plan related to  self management and patient's adherence to plan as established by provider  Transitions of Care:  New goal. 04/16/23: prior [facility] 30-day program care plan completed with development of new care plan Durable Medical Equipment (DME) needs assessed with patient/caregiver Doctor Visits  - discussed the importance of doctor visits Arranged PCP follow-up within 12-14 days (Care Guide Scheduled) Care coordination outreach in real-time with  scheduling care guide to successfully schedule hospital follow up PCP appointment 04/23/23 Discussed current clinical condition:  "I am fine; doing good and don't have any questions; I am back at work.  Still managing all of my own medications and still completely independent."  Denies specific clinical concerns today Confirmed not currently requiring/ using assistive devices for ambulation- reports feels steady on feet with ambulation; reports back at work Provided education around benefit of conservative post-hospital discharge activity; need to pace activity without over-doing  Confirmed patient continues monitoring blood sugars at home- unable to review- patient is back at work and is currently working during [facility] call Confirmed patient continues to take BP's at home: she reports "Seemed a little high today, but they stopped one of my blood pressure medications;" she is at work and is not near her recorded blood pressures from home: encouraged patient to continue monitoring/ recording BP's at home and to take recorded values to upcoming PCP appointment on 04/23/23  Patient Goals/Self-Care Activities: Participate in Transition of Care Program/Attend TOC scheduled calls Take all medications as prescribed Attend  all scheduled provider appointments Perform all self care activities independently  Call provider office for new concerns or questions  Continue pacing activity as your recuperation from recent hospitalization continues Continue monitoring and recording your blood sugars and blood pressures at home, and take these values with you to your upcoming doctor appointment on 04/23/23  Follow Up Plan:  Telephone follow up appointment with care management team member scheduled for:  Tuesday 04/24/23 at 09:45 am          Caryl Pina, RN, BSN, CCRN Alumnus RN Care Manager  Transitions of Care  [facility] [phone]: direct office

## 2023-04-17 DIAGNOSIS — I739 Peripheral vascular disease, unspecified: Secondary | ICD-10-CM | POA: Diagnosis not present

## 2023-04-17 DIAGNOSIS — L97522 Non-pressure chronic ulcer of other part of left foot with fat layer exposed: Secondary | ICD-10-CM | POA: Diagnosis not present

## 2023-04-17 DIAGNOSIS — Z9889 Other specified postprocedural states: Secondary | ICD-10-CM | POA: Diagnosis not present

## 2023-04-17 DIAGNOSIS — Z89422 Acquired absence of other left toe(s): Secondary | ICD-10-CM | POA: Diagnosis not present

## 2023-04-17 DIAGNOSIS — E11621 Type 2 diabetes mellitus with foot ulcer: Secondary | ICD-10-CM | POA: Diagnosis not present

## 2023-04-17 DIAGNOSIS — Z794 Long term (current) use of insulin: Secondary | ICD-10-CM | POA: Diagnosis not present

## 2023-04-17 DIAGNOSIS — E1151 Type 2 diabetes mellitus with diabetic peripheral angiopathy without gangrene: Secondary | ICD-10-CM | POA: Diagnosis not present

## 2023-04-17 DIAGNOSIS — L97512 Non-pressure chronic ulcer of other part of right foot with fat layer exposed: Secondary | ICD-10-CM | POA: Diagnosis not present

## 2023-04-18 ENCOUNTER — Other Ambulatory Visit (INDEPENDENT_AMBULATORY_CARE_PROVIDER_SITE_OTHER): Payer: PPO

## 2023-04-18 ENCOUNTER — Telehealth: Payer: Self-pay

## 2023-04-18 ENCOUNTER — Other Ambulatory Visit: Payer: Self-pay

## 2023-04-18 DIAGNOSIS — D509 Iron deficiency anemia, unspecified: Secondary | ICD-10-CM

## 2023-04-18 LAB — CBC WITH DIFFERENTIAL/PLATELET
Basophils Absolute: 0.1 10*3/uL (ref 0.0–0.1)
Basophils Relative: 0.7 % (ref 0.0–3.0)
Eosinophils Absolute: 0.2 10*3/uL (ref 0.0–0.7)
Eosinophils Relative: 1.5 % (ref 0.0–5.0)
HCT: 26.5 % — ABNORMAL LOW (ref 36.0–46.0)
Hemoglobin: 8.5 g/dL — ABNORMAL LOW (ref 12.0–15.0)
Lymphocytes Relative: 16.2 % (ref 12.0–46.0)
Lymphs Abs: 1.6 10*3/uL (ref 0.7–4.0)
MCHC: 32.2 g/dL (ref 30.0–36.0)
MCV: 78.9 fL (ref 78.0–100.0)
Monocytes Absolute: 0.6 10*3/uL (ref 0.1–1.0)
Monocytes Relative: 6.1 % (ref 3.0–12.0)
Neutro Abs: 7.5 10*3/uL (ref 1.4–7.7)
Neutrophils Relative %: 75.5 % (ref 43.0–77.0)
Platelets: 302 10*3/uL (ref 150.0–400.0)
RBC: 3.36 Mil/uL — ABNORMAL LOW (ref 3.87–5.11)
RDW: 18.3 % — ABNORMAL HIGH (ref 11.5–15.5)
WBC: 10 10*3/uL (ref 4.0–10.5)

## 2023-04-18 NOTE — Telephone Encounter (Signed)
Mail out to pt PAP and fax doctor's portion 04/13/23 [employer] (Ozempic)

## 2023-04-18 NOTE — Telephone Encounter (Signed)
I recommend waiting for the results of the next CBC check and make sure she does not have recurrent of overt bleeding (i.e. the melena that brought her into the hospital) for the next couple of weeks before we decide if she wants to go back on the plavix.  Her vascular surgeon ideally wants her on both aspirin and plavix because of the recent peripheral bypass surgery.  So if no recurrent bleeding and stable Hgb with next check, and if she feels comfortable taking the risk ( as we discussed in the hospital), then resume plavix afterward.  Hopefully this message will help inform covering doc of day when the next result comes back and I am out of the office.  - H. Danis

## 2023-04-18 NOTE — Telephone Encounter (Signed)
Called patient to notify about the HGB reading at 8.9 and stable per Ms. Lemmon,PA. Patient having concerns about whether or not she will be starting the Plavix medication. Informed the patient as soon as Dr. Myrtie Neither notes the lab and makes a decision, you will be notified. Patient understands and agreeable.

## 2023-04-19 ENCOUNTER — Telehealth: Payer: Self-pay | Admitting: Gastroenterology

## 2023-04-19 NOTE — Telephone Encounter (Signed)
Pt aware of results. Refer to lab result 04/18/23.

## 2023-04-19 NOTE — Telephone Encounter (Signed)
I am copying my 04/18/23 result note to Hyacinth Meeker here:  Hemoglobin stable overall from recent hospital values.   Recheck CBC the week after next, ideally 04/30/2023. Please continue to check my inbox while I am out of the office at that time and communicate with DOD if needed.   H Danis

## 2023-04-19 NOTE — Telephone Encounter (Signed)
Inbound call from patient returning phone call regarding recent lab results. Please advise, thank you.  

## 2023-04-20 NOTE — Telephone Encounter (Signed)
Called patient to notify that her CBC labs were stable, patient had been notified yesterday as well per Ms. Lemmon,PA Dr. Myrtie Neither also informed the patient of the stable CBC lab. Dr. Myrtie Neither wants the patient to have her CBC re-checked on 04/30/23. Patient agreed.

## 2023-04-23 ENCOUNTER — Inpatient Hospital Stay: Payer: PPO | Admitting: Internal Medicine

## 2023-04-24 ENCOUNTER — Ambulatory Visit (INDEPENDENT_AMBULATORY_CARE_PROVIDER_SITE_OTHER): Payer: PPO | Admitting: Internal Medicine

## 2023-04-24 ENCOUNTER — Encounter: Payer: Self-pay | Admitting: Internal Medicine

## 2023-04-24 ENCOUNTER — Other Ambulatory Visit: Payer: Self-pay | Admitting: Internal Medicine

## 2023-04-24 ENCOUNTER — Other Ambulatory Visit: Payer: Self-pay | Admitting: *Deleted

## 2023-04-24 VITALS — BP 156/60 | HR 56 | Temp 97.5°F | Ht 64.0 in | Wt 175.6 lb

## 2023-04-24 DIAGNOSIS — Z794 Long term (current) use of insulin: Secondary | ICD-10-CM

## 2023-04-24 DIAGNOSIS — E538 Deficiency of other specified B group vitamins: Secondary | ICD-10-CM

## 2023-04-24 DIAGNOSIS — D509 Iron deficiency anemia, unspecified: Secondary | ICD-10-CM

## 2023-04-24 DIAGNOSIS — Z0000 Encounter for general adult medical examination without abnormal findings: Secondary | ICD-10-CM

## 2023-04-24 DIAGNOSIS — G63 Polyneuropathy in diseases classified elsewhere: Secondary | ICD-10-CM | POA: Diagnosis not present

## 2023-04-24 DIAGNOSIS — J0100 Acute maxillary sinusitis, unspecified: Secondary | ICD-10-CM | POA: Diagnosis not present

## 2023-04-24 DIAGNOSIS — E119 Type 2 diabetes mellitus without complications: Secondary | ICD-10-CM

## 2023-04-24 DIAGNOSIS — Z0001 Encounter for general adult medical examination with abnormal findings: Secondary | ICD-10-CM

## 2023-04-24 DIAGNOSIS — I10 Essential (primary) hypertension: Secondary | ICD-10-CM | POA: Diagnosis not present

## 2023-04-24 LAB — BASIC METABOLIC PANEL
BUN: 11 mg/dL (ref 6–23)
CO2: 23 meq/L (ref 19–32)
Calcium: 8.6 mg/dL (ref 8.4–10.5)
Chloride: 109 meq/L (ref 96–112)
Creatinine, Ser: 0.75 mg/dL (ref 0.40–1.20)
GFR: 83.11 mL/min (ref >60.00)
Glucose, Bld: 199 mg/dL — ABNORMAL HIGH (ref 70–99)
Potassium: 4.1 meq/L (ref 3.5–5.1)
Sodium: 140 meq/L (ref 135–145)

## 2023-04-24 LAB — CBC WITH DIFFERENTIAL/PLATELET
Basophils Absolute: 0.1 10*3/uL (ref 0.0–0.1)
Basophils Relative: 0.9 % (ref 0.0–3.0)
Eosinophils Absolute: 0.1 10*3/uL (ref 0.0–0.7)
Eosinophils Relative: 1.3 % (ref 0.0–5.0)
HCT: 26.7 % — ABNORMAL LOW (ref 36.0–46.0)
Hemoglobin: 8.4 g/dL — ABNORMAL LOW (ref 12.0–15.0)
Lymphocytes Relative: 15.9 % (ref 12.0–46.0)
Lymphs Abs: 1.4 10*3/uL (ref 0.7–4.0)
MCHC: 31.6 g/dL (ref 30.0–36.0)
MCV: 77.4 fL — ABNORMAL LOW (ref 78.0–100.0)
Monocytes Absolute: 0.4 10*3/uL (ref 0.1–1.0)
Monocytes Relative: 5 % (ref 3.0–12.0)
Neutro Abs: 6.6 10*3/uL (ref 1.4–7.7)
Neutrophils Relative %: 76.9 % (ref 43.0–77.0)
Platelets: 381 10*3/uL (ref 150.0–400.0)
RBC: 3.45 Mil/uL — ABNORMAL LOW (ref 3.87–5.11)
RDW: 18.8 % — ABNORMAL HIGH (ref 11.5–15.5)
WBC: 8.5 10*3/uL (ref 4.0–10.5)

## 2023-04-24 LAB — IBC + FERRITIN
Ferritin: 10.7 ng/mL (ref 10.0–291.0)
Iron: 14 ug/dL — ABNORMAL LOW (ref 42–145)
Saturation Ratios: 4.2 % — ABNORMAL LOW (ref 20.0–50.0)
TIBC: 334.6 ug/dL (ref 250.0–450.0)
Transferrin: 239 mg/dL (ref 212.0–360.0)

## 2023-04-24 LAB — VITAMIN B12: Vitamin B-12: 179 pg/mL — ABNORMAL LOW (ref 211–911)

## 2023-04-24 MED ORDER — CEFDINIR 300 MG PO CAPS: 300.0000 mg | ORAL_CAPSULE | Freq: Two times a day (BID) | ORAL | 0 refills | Status: AC

## 2023-04-24 MED ORDER — ACCRUFER 30 MG PO CAPS: 1.0000 | ORAL_CAPSULE | Freq: Two times a day (BID) | ORAL | 0 refills | Status: DC

## 2023-04-24 NOTE — Patient Instructions (Signed)

## 2023-04-24 NOTE — Progress Notes (Unsigned)
Subjective:  Patient ID: Theresa Harrington, female    DOB: 12/29/1955  Age: 67 y.o. MRN: [id number]  CC: Anemia, Hypertension, Diabetes, and Annual Exam   HPI Theresa Harrington presents for a CPX and f/up ---  Discussed the use of AI scribe software for clinical note transcription with the patient, who gave verbal consent to proceed.  History of Present Illness   The patient, with a history of anemia, presents with concerns about ongoing blood loss. She denies current symptoms of weakness, dizziness, or lightheadedness, and reports no chest pain or shortness of breath. She is not currently taking iron supplements. She denies current constipation, diarrhea, or visible blood in her stool.  In addition to her gastrointestinal concerns, the patient reports a week-long history of sinus symptoms, characterized by daily morning headaches located on the right side of her face and nose. She describes a sensation of pressure, exacerbated by bending over. She reports a right nasal discharge, particularly in the mornings, but denies fever, chills, or changes in vision or hearing.  The patient also mentions a history of hypertension, for which she was previously on medication, but this has been discontinued. She reports taking daily aspirin for an unspecified condition.       Outpatient Medications Prior to Visit  Medication Sig Dispense Refill   aspirin EC 325 MG tablet Take 1 tablet (325 mg total) by mouth daily. 30 tablet 0   bisacodyl (DULCOLAX) 5 MG EC tablet Take 5 mg by mouth daily as needed for moderate constipation.     Blood Glucose Monitoring Suppl (ONETOUCH VERIO IQ SYSTEM) w/Device KIT USE TO CHECK SUGAR DAILY 1 kit 0   cetirizine (ZYRTEC) 10 MG tablet Take 10 mg by mouth daily as needed for allergies.     diltiazem (CARDIZEM SR) 60 MG 12 hr capsule Take 1 capsule (60 mg total) by mouth 2 (two) times daily. Hold for blood pressure less than 120, or heart rate less than 60 60 capsule 0   diltiazem  (CARDIZEM) 30 MG tablet Take 1 tablet every 4 hours AS NEEDED for AFIB heart rate >100 as long as top BP >100. 30 tablet 1   Docusate Sodium (COLACE PO) Take 1 tablet by mouth daily as needed.     dorzolamide-timolol (COSOPT) 22.3-6.8 MG/ML ophthalmic solution Place 1 drop into the right eye 2 (two) times daily.      ezetimibe (ZETIA) 10 MG tablet Take 1 tablet (10 mg total) by mouth daily. 90 tablet 0   Insulin Glargine (BASAGLAR KWIKPEN) 100 UNIT/ML Inject 60 Units into the skin daily. (Patient taking differently: Inject 50 Units into the skin at bedtime.) 54 mL 1   Insulin Pen Needle (PEN NEEDLES) 32G X 4 MM MISC Inject 1 pen  as directed 4 (four) times daily. Use to inject levemir or novolog as directed. 400 each 3   latanoprost (XALATAN) 0.005 % ophthalmic solution Place 1 drop into both eyes at bedtime.      MAGnesium-Oxide 400 (240 Mg) MG tablet Take 1 tablet (400 mg total) by mouth daily. 90 tablet 0   nebivolol (BYSTOLIC) 10 MG tablet Take 1 tablet by mouth once daily 90 tablet 3   ondansetron (ZOFRAN) 4 MG tablet Take 1 tablet (4 mg total) by mouth every 8 (eight) hours as needed for nausea or vomiting. 20 tablet 0   ONETOUCH VERIO test strip USE TO check blood glucose TWICE DAILY 200 strip 3   oxyCODONE (OXY IR/ROXICODONE) 5 MG immediate release tablet  Take 1-2 tablets (5-10 mg total) by mouth every 4 (four) hours as needed for moderate pain (pain score 4-6). 30 tablet 0   pantoprazole (PROTONIX) 40 MG tablet Take 1 tablet (40 mg total) by mouth 2 (two) times daily. 60 tablet 0   rosuvastatin (CRESTOR) 5 MG tablet Take 1 tablet (5 mg total) by mouth daily. 90 tablet 3   Semaglutide, 1 MG/DOSE, (OZEMPIC, 1 MG/DOSE,) 4 MG/3ML SOPN Inject 1 mg into the skin once a week. Via [employer] patient assistance 9 mL 1   SYNJARDY 5-500 MG TABS Take 1 tablet by mouth twice daily (Patient taking differently: Take 1 tablet by mouth daily.) 180 tablet 0   XARELTO 20 MG TABS tablet Take 1 tablet by mouth  once daily 90 tablet 0   HUMALOG KWIKPEN 100 UNIT/ML KwikPen DIAL 14 UNITS AND INJECT UNDER THE SKIN THREE TIMES A DAY. (Patient taking differently: Inject 14 Units into the skin 3 (three) times daily as needed.) 45 mL 0   No facility-administered medications prior to visit.    ROS Review of Systems  Constitutional:  Positive for unexpected weight change (wt gain). Negative for appetite change, chills, diaphoresis, fatigue and fever.  HENT: Negative.    Respiratory: Negative.  Negative for cough, chest tightness, shortness of breath and wheezing.   Cardiovascular:  Negative for chest pain, palpitations and leg swelling.  Gastrointestinal:  Negative for abdominal pain, constipation, diarrhea, nausea and vomiting.  Genitourinary:  Negative for difficulty urinating and dysuria.  Musculoskeletal: Negative.  Negative for arthralgias, back pain, myalgias and neck pain.  Skin: Negative.  Negative for color change and rash.  Neurological: Negative.  Negative for dizziness and weakness.  Hematological:  Negative for adenopathy. Does not bruise/bleed easily.  Psychiatric/Behavioral: Negative.      Objective:  BP (!) 156/60 (BP Location: Left Arm, Patient Position: Sitting, Cuff Size: Normal)   Pulse (!) 56   Temp (!) 97.5 F (36.4 C) (Oral)   Ht 5\' 4"  (1.626 m)   Wt 175 lb 9.6 oz (79.7 kg)   LMP  (LMP Unknown)   SpO2 97%   BMI 30.14 kg/m   BP Readings from Last 3 Encounters:  04/24/23 (!) 156/60  04/13/23 (!) 123/49  04/06/23 116/72    Wt Readings from Last 3 Encounters:  04/24/23 175 lb 9.6 oz (79.7 kg)  04/11/23 167 lb 8.8 oz (76 kg)  04/06/23 165 lb (74.8 kg)    Physical Exam Vitals reviewed.  Constitutional:      Appearance: Normal appearance.  HENT:     Mouth/Throat:     Mouth: Mucous membranes are moist. Mucous membranes are pale.  Eyes:     General: No scleral icterus.    Conjunctiva/sclera: Conjunctivae normal.  Cardiovascular:     Rate and Rhythm: Normal rate  and regular rhythm.     Heart sounds: No murmur heard.    No friction rub. No gallop.  Pulmonary:     Effort: Pulmonary effort is normal.     Breath sounds: No stridor. No wheezing, rhonchi or rales.  Abdominal:     General: Abdomen is flat.     Palpations: There is no mass.     Tenderness: There is no abdominal tenderness. There is no guarding.     Hernia: No hernia is present.  Musculoskeletal:     Cervical back: Neck supple.  Lymphadenopathy:     Cervical: No cervical adenopathy.  Skin:    Coloration: Skin is pale. Skin is  not jaundiced.     Findings: No rash.  Neurological:     General: No focal deficit present.     Mental Status: She is alert. Mental status is at baseline.  Psychiatric:        Mood and Affect: Mood normal.        Behavior: Behavior normal.     Lab Results  Component Value Date   WBC 8.5 04/24/2023   HGB 8.4 Repeated and verified X2. (L) 04/24/2023   HCT 26.7 (L) 04/24/2023   PLT 381.0 04/24/2023   GLUCOSE 199 (H) 04/24/2023   CHOL 92 03/10/2023   TRIG 81 03/10/2023   HDL 38 (L) 03/10/2023   LDLDIRECT 160.2 03/01/2010   LDLCALC 38 03/10/2023   ALT 11 04/07/2023   AST 14 (L) 04/07/2023   NA 140 04/24/2023   K 4.1 04/24/2023   CL 109 04/24/2023   CREATININE 0.75 04/24/2023   BUN 11 04/24/2023   CO2 23 04/24/2023   TSH 0.634 04/07/2023   INR 1.0 03/09/2023   HGBA1C 8.4 (H) 02/20/2023   MICROALBUR <0.7 02/20/2023    No results found.  Assessment & Plan:   Iron deficiency anemia, unspecified iron deficiency anemia type- Will start iron replacement therapy. -     IBC + Ferritin; Future -     CBC with Differential/Platelet; Future -     ACCRUFeR; Take 1 capsule (30 mg total) by mouth in the morning and at bedtime.  Dispense: 180 capsule; Refill: 0  Vitamin B12 deficiency neuropathy (HCC)- Will start parenteral B12 replacement therapy. -     CBC with Differential/Platelet; Future -     Vitamin B12; Future  Acute non-recurrent maxillary  sinusitis -     Cefdinir; Take 1 capsule (300 mg total) by mouth 2 (two) times daily for 10 days.  Dispense: 20 capsule; Refill: 0  Essential hypertension, benign -     Basic metabolic panel; Future  Insulin-requiring or dependent type II diabetes mellitus (HCC) -     Insulin Lispro (1 Unit Dial); Inject 14 Units into the skin with breakfast, with lunch, and with evening meal. DIAL 14 UNITS AND INJECT UNDER THE SKIN THREE TIMES A DAY.  Dispense: 45 mL; Refill: 0  Encounter for general adult medical examination with abnormal findings- Exam completed, labs reviewed, vaccines reviewed, cancer screenings are UTD, pt ed material was given.      Follow-up: Return in about 3 months (around 07/23/2023).  Sanda Linger, MD

## 2023-04-24 NOTE — Patient Outreach (Signed)
Care Management  Transitions of Care Program Transitions of Care Post-discharge week 2/ day # 8- case closure   04/24/2023 Name: Theresa Harrington MRN: [id number] DOB: 1956-01-29  Subjective: Theresa Harrington is a 67 y.o. year old female who is a primary care patient of Etta Grandchild, MD. The Care Management team Engaged with patient Engaged with patient by telephone to assess and address transitions of care needs.   Consent to Services:  Patient was given information about care management services, agreed to services, and gave verbal consent to participate.  Enrolled originally on 03/14/23; re-enrolled 04/16/23  Assessment:  "I am fine; doing good and don't have any questions; I am still back at work.  Going to see Dr. Yetta Barre today; Still managing all of my own medications and still completely independent."    Denies specific clinical concerns today/ sounds to be in no distress throughout [facility] 30-day program outreach call today          SDOH Interventions    Flowsheet Row Telephone from 04/16/2023 in [place] POPULATION HEALTH DEPARTMENT Office Visit from 04/06/2023 in [facility] Patient Outreach from 03/28/2023 in [place] POPULATION HEALTH DEPARTMENT Telephone from 03/14/2023 in [place] POPULATION HEALTH DEPARTMENT Clinical Support from 12/07/2021 in [facility] Clinical Support from 06/03/2020 in [facility] [place]  SDOH Interventions        Food Insecurity Interventions Intervention Not Indicated -- -- Intervention Not Indicated Intervention Not Indicated Intervention Not Indicated  Housing Interventions Intervention Not Indicated -- Intervention Not Indicated -- Intervention Not Indicated Intervention Not Indicated  Transportation Interventions Intervention Not Indicated  [has resumed driving self- confirms now back at work] -- -- Intervention Not Indicated  [reports normally drives self,   friends assisting post-recent surgery] Intervention Not Indicated Intervention Not Indicated  Utilities Interventions Intervention Not Indicated -- Intervention Not Indicated -- -- --  Depression Interventions/Treatment  -- PHQ2-9 Score <4 Follow-up Not Indicated -- -- -- --  Financial Strain Interventions -- -- -- -- Intervention Not Indicated Intervention Not Indicated  Physical Activity Interventions -- -- -- -- Intervention Not Indicated Intervention Not Indicated, Patient Refused  Stress Interventions -- -- -- -- Intervention Not Indicated Intervention Not Indicated  Social Connections Interventions -- -- -- -- Intervention Not Indicated Intervention Not Indicated  Health Literacy Interventions Intervention Not Indicated -- -- Intervention Not Indicated -- --        Goals Addressed             This Visit's Progress    TOC 30-day Program Care Plan   On track    Current Barriers:  Provider appointments Need to ensure appropriate follow up with providers post-recent hospital discharge on 04/13/23 Hospital Re-admission with 30-days of last discharge Patient lives alone/ independent in all aspects of self-care/ medication administration; has resumed working full time: unsure she wishes to complete another TOC 3-day program series, now that she has returned to work: she is agreeable to "at least one more call next week"  04/24/23: Patient declines need for ongoing/ further care management outreach; declines ongoing outreach for 30-day TOC program; re- provided my direct contact information should questions/ concerns/ needs arise post-TOC 30-day program outreach call today   RNCM Clinical Goal(s):  Patient will work with the Care Management team over the next 30 days to address Transition of Care Barriers: Provider appointments General support after 3 recent hospitalizations take all medications exactly as  prescribed and will call provider for medication related questions as evidenced by  patient reporting during extension of TOC 30-day program outreaches with RN CM  attend all scheduled medical appointments: 04/23/23  PCP as evidenced by review of same in EHR and with patient during TOC 30-day program outreaches not experience hospital admission as evidenced by review of EMR. Hospital Admissions in last 6 months = 2  through collaboration with RN Care manager, provider, and care team.   Interventions: Evaluation of current treatment plan related to  self management and patient's adherence to plan as established by provider  Transitions of Care:  Patient declined further engagement on this goal. 04/24/23 Labs Reviewed recent Hgb/ HCT from 04/18/23 values (GI provider) Doctor Visits  - discussed the importance of doctor visits Discussed current clinical condition:  "I am fine; doing good and don't have any questions; I am still back at work.  Going to see Dr. Yetta Barre today; Still managing all of my own medications and still completely independent."  Denies specific clinical concerns today Confirmed patient continues monitoring blood sugars at home- unable to review- patient is back at work; today during our call she is at the vet with her pet during [facility] call Confirmed patient continues to take BP's at home: she reports "Seems a little high every now and then- I took one of the hydralazine pills this week, but have not started them back regularly.  I am going to ask Dr. Yetta Barre if I should re-start them;"  she is not near her recorded blood pressures from home: encouraged patient to continue monitoring/ recording BP's at home and to take recorded values to upcoming PCP appointment on 04/24/23; confirmed also that patient has not yet re-started Plavix-- awaiting for instructions from GI doctor  Patient Goals/Self-Care Activities: Take all medications as prescribed Attend all scheduled provider appointments Perform all self care activities independently  Call provider office for new  concerns or questions  Continue monitoring and recording your blood sugars and blood pressures at home, and take these values with you to your upcoming doctor appointment on 04/24/23  Follow Up Plan:  No further follow up required: Patient declines ongoing participation in [facility] 30-day  program          Plan: No further follow up required: Patient declines ongoing participation in [facility] 30-day  program    Caryl Pina, RN, BSN, CCRN Alumnus RN Care Manager  Transitions of Care  [facility] - [facility] [phone]: direct office

## 2023-04-25 ENCOUNTER — Encounter: Payer: Self-pay | Admitting: Internal Medicine

## 2023-04-25 DIAGNOSIS — E119 Type 2 diabetes mellitus without complications: Secondary | ICD-10-CM | POA: Insufficient documentation

## 2023-04-25 DIAGNOSIS — Z0001 Encounter for general adult medical examination with abnormal findings: Secondary | ICD-10-CM | POA: Insufficient documentation

## 2023-04-25 MED ORDER — INSULIN LISPRO (1 UNIT DIAL) 100 UNIT/ML (KWIKPEN): 14.0000 [IU] | PEN_INJECTOR | Freq: Three times a day (TID) | SUBCUTANEOUS | 0 refills | Status: AC

## 2023-04-26 ENCOUNTER — Ambulatory Visit: Payer: PPO

## 2023-04-26 DIAGNOSIS — E538 Deficiency of other specified B group vitamins: Secondary | ICD-10-CM | POA: Diagnosis not present

## 2023-04-26 DIAGNOSIS — G63 Polyneuropathy in diseases classified elsewhere: Secondary | ICD-10-CM | POA: Diagnosis not present

## 2023-04-26 MED ORDER — CYANOCOBALAMIN 1000 MCG/ML IJ SOLN
1000.0000 ug | Freq: Once | INTRAMUSCULAR | Status: AC
  Administered 2023-04-26: 1000 ug via INTRAMUSCULAR

## 2023-04-26 MED ORDER — LOSARTAN POTASSIUM 25 MG PO TABS: 25.0000 mg | ORAL_TABLET | Freq: Every day | ORAL | 0 refills | Status: DC

## 2023-04-26 NOTE — Progress Notes (Signed)
 PT visits today for their b-12 injection. PT informed of what they had received and tolerated injection well. PT notified to reach out to office if needed.

## 2023-04-26 NOTE — Telephone Encounter (Signed)
Duplicated message, Have already sent the other message to DR. Yetta Barre.

## 2023-04-26 NOTE — Addendum Note (Signed)
Addended by: Etta Grandchild on: 04/26/2023 10:48 AM   Modules accepted: Orders

## 2023-04-30 ENCOUNTER — Other Ambulatory Visit: Payer: Self-pay

## 2023-04-30 DIAGNOSIS — D509 Iron deficiency anemia, unspecified: Secondary | ICD-10-CM

## 2023-04-30 NOTE — Telephone Encounter (Signed)
Chart reviewed and noted the previous my chart message sent to pt.   Annett Fabian, RN routed this conversation to Sherrilyn Rist, MD Annett Fabian, RN to [name] Regarding result: CBC w/Diff      04/30/23 12:27 PM Dr. Myrtie Neither had wanted you to have labs on 12/30.  I will have him review the labs you had on 12/24 and the recommendations from your PCP.  We will get back with you when he would like you to repeat it.  He will return to the office on 05/03/23

## 2023-04-30 NOTE — Telephone Encounter (Signed)
Patient called she had lab orders pending with [place], stated she went to see her PCP and had labs done with them. She would like to know if that is sufficient. Please advise.

## 2023-05-01 DIAGNOSIS — L97512 Non-pressure chronic ulcer of other part of right foot with fat layer exposed: Secondary | ICD-10-CM | POA: Insufficient documentation

## 2023-05-01 DIAGNOSIS — Z9889 Other specified postprocedural states: Secondary | ICD-10-CM | POA: Diagnosis not present

## 2023-05-01 DIAGNOSIS — I739 Peripheral vascular disease, unspecified: Secondary | ICD-10-CM | POA: Diagnosis not present

## 2023-05-03 ENCOUNTER — Other Ambulatory Visit (HOSPITAL_COMMUNITY): Payer: Self-pay

## 2023-05-03 NOTE — Telephone Encounter (Signed)
 I see that her hemoglobin was stable at 8.5 on 04/20/2023 and that [facility] contacted her with that result.  Please see prior notes, I had hoped for her to have a CBC earlier this week, but if it was done I do not yet have the results.  I would like her to have a CBC today or tomorrow so we can make a decision on whether or not to resume her Plavix .  VEAR Brand MD

## 2023-05-03 NOTE — Telephone Encounter (Signed)
 Received pt portion via onbase waiting on Dr portion.

## 2023-05-03 NOTE — Telephone Encounter (Signed)
 Spoke with patient & she will come by our lab tomorrow.

## 2023-05-04 ENCOUNTER — Other Ambulatory Visit: Payer: Self-pay

## 2023-05-04 ENCOUNTER — Telehealth: Payer: Self-pay

## 2023-05-04 ENCOUNTER — Other Ambulatory Visit (INDEPENDENT_AMBULATORY_CARE_PROVIDER_SITE_OTHER): Payer: PPO

## 2023-05-04 ENCOUNTER — Other Ambulatory Visit: Payer: Self-pay | Admitting: Physician Assistant

## 2023-05-04 DIAGNOSIS — D509 Iron deficiency anemia, unspecified: Secondary | ICD-10-CM

## 2023-05-04 LAB — CBC WITH DIFFERENTIAL/PLATELET
Basophils Absolute: 0 10*3/uL (ref 0.0–0.1)
Basophils Relative: 0.5 % (ref 0.0–3.0)
Eosinophils Absolute: 0.1 10*3/uL (ref 0.0–0.7)
Eosinophils Relative: 1.5 % (ref 0.0–5.0)
HCT: 28.5 % — ABNORMAL LOW (ref 36.0–46.0)
Hemoglobin: 9 g/dL — ABNORMAL LOW (ref 12.0–15.0)
Lymphocytes Relative: 16.1 % (ref 12.0–46.0)
Lymphs Abs: 1.4 10*3/uL (ref 0.7–4.0)
MCHC: 31.5 g/dL (ref 30.0–36.0)
MCV: 77 fL — ABNORMAL LOW (ref 78.0–100.0)
Monocytes Absolute: 0.6 10*3/uL (ref 0.1–1.0)
Monocytes Relative: 6.5 % (ref 3.0–12.0)
Neutro Abs: 6.6 10*3/uL (ref 1.4–7.7)
Neutrophils Relative %: 75.4 % (ref 43.0–77.0)
Platelets: 373 10*3/uL (ref 150.0–400.0)
RBC: 3.71 Mil/uL — ABNORMAL LOW (ref 3.87–5.11)
RDW: 19.8 % — ABNORMAL HIGH (ref 11.5–15.5)
WBC: 8.7 10*3/uL (ref 4.0–10.5)

## 2023-05-04 MED ORDER — PANTOPRAZOLE SODIUM 40 MG PO TBEC: 40.0000 mg | DELAYED_RELEASE_TABLET | Freq: Two times a day (BID) | ORAL | 0 refills | Status: DC

## 2023-05-04 MED ORDER — ASPIRIN 81 MG PO TBEC: 81.0000 mg | DELAYED_RELEASE_TABLET | Freq: Every day | ORAL | Status: DC

## 2023-05-04 MED ORDER — CLOPIDOGREL BISULFATE 75 MG PO TABS: 75.0000 mg | ORAL_TABLET | Freq: Every day | ORAL | 3 refills | Status: AC

## 2023-05-04 NOTE — Telephone Encounter (Signed)
 Pt called to request refill on Plavix  and Protonix . This was discussed at her appt here last month. APP has reordered them and pt is aware in the future, her PCP will need to manage the Protonix  refills. Pt also was unsure if she should still taking 325 mg of Aspirin  or switch to 81mg . Per APP, she has been advised to switch to 81 mg of Aspirin . Pt is aware. No further questions/concerns.

## 2023-05-13 ENCOUNTER — Other Ambulatory Visit: Payer: Self-pay | Admitting: Internal Medicine

## 2023-05-13 DIAGNOSIS — K5904 Chronic idiopathic constipation: Secondary | ICD-10-CM

## 2023-05-13 DIAGNOSIS — E785 Hyperlipidemia, unspecified: Secondary | ICD-10-CM

## 2023-05-13 DIAGNOSIS — D509 Iron deficiency anemia, unspecified: Secondary | ICD-10-CM

## 2023-05-14 ENCOUNTER — Ambulatory Visit (INDEPENDENT_AMBULATORY_CARE_PROVIDER_SITE_OTHER)
Admission: RE | Admit: 2023-05-14 | Discharge: 2023-05-14 | Disposition: A | Discharge status: Home / Self Care | Payer: PPO | Source: Ambulatory Visit | Attending: Surgery | Admitting: Surgery

## 2023-05-14 ENCOUNTER — Ambulatory Visit: Payer: PPO | Admitting: Physician Assistant

## 2023-05-14 ENCOUNTER — Ambulatory Visit (HOSPITAL_COMMUNITY)
Admission: RE | Admit: 2023-05-14 | Discharge: 2023-05-14 | Disposition: A | Discharge status: Home / Self Care | Payer: PPO | Source: Ambulatory Visit | Attending: Surgery | Admitting: Surgery

## 2023-05-14 VITALS — BP 182/68 | HR 60 | Temp 98.3°F | Resp 18 | Ht 64.0 in | Wt 173.0 lb

## 2023-05-14 DIAGNOSIS — I7025 Atherosclerosis of native arteries of other extremities with ulceration: Secondary | ICD-10-CM | POA: Diagnosis not present

## 2023-05-14 DIAGNOSIS — I70213 Atherosclerosis of native arteries of extremities with intermittent claudication, bilateral legs: Secondary | ICD-10-CM

## 2023-05-14 LAB — VAS US ABI WITH/WO TBI: Left ABI: 0.81

## 2023-05-14 MED ORDER — DILTIAZEM HCL ER 60 MG PO CP12: 60.0000 mg | ORAL_CAPSULE | Freq: Two times a day (BID) | ORAL | 0 refills | Status: DC

## 2023-05-14 NOTE — Progress Notes (Addendum)
 POST OPERATIVE OFFICE NOTE    CC:  F/u for surgery  HPI:  This is a 68 y.o. female who is s/p right iliofemoral endarterectomy with vein patch angioplasty and femoral to posterior tibial artery bypass graft with composite bypass by Dr. Serene on 03/09/2023 due to right great toe ulceration.  All incisions have completely healed.  She believes her great toe ulcer is nearly healed.  She has returned to work since last office visit.  She continues to complain about right lower extremity edema however manages this with a compression sock.  She denies rest pain or claudication.  Since last office visit she underwent upper and lower endoscopy by GI due to anemia.  Triple therapy was held however since then she has been restarted on Xarelto  and aspirin .  She is wondering if she needs to also restart her Plavix .  Allergies  Allergen Reactions   Amlodipine  Swelling   Atorvastatin  Other (See Comments)    Muscle aches    Ampicillin  Nausea And Vomiting and Other (See Comments)   Codeine Nausea And Vomiting    Ask patient   Lisinopril Cough        Penicillins Nausea And Vomiting    Did it involve swelling of the face/tongue/throat, SOB, or low BP? No Did it involve sudden or severe rash/hives, skin peeling, or any reaction on the inside of your mouth or nose? No Did you need to seek medical attention at a hospital or doctor's office? No When did it last happen?      20+ years If all above answers are NO, may proceed with cephalosporin use.    Januvia  [Sitagliptin ] Nausea And Vomiting    Current Outpatient Medications  Medication Sig Dispense Refill   aspirin  EC 81 MG tablet Take 1 tablet (81 mg total) by mouth daily. Swallow whole. (Patient taking differently: Take 325 mg by mouth daily. Swallow whole.)     bisacodyl  (DULCOLAX) 5 MG EC tablet Take 5 mg by mouth daily as needed for moderate constipation.     Blood Glucose Monitoring Suppl (ONETOUCH VERIO IQ SYSTEM) w/Device KIT USE TO CHECK  SUGAR DAILY 1 kit 0   cetirizine  (ZYRTEC ) 10 MG tablet Take 10 mg by mouth daily as needed for allergies.     clopidogrel  (PLAVIX ) 75 MG tablet Take 1 tablet (75 mg total) by mouth daily. (Patient not taking: Reported on 05/14/2023) 30 tablet 3   Docusate Sodium  (COLACE PO) Take 1 tablet by mouth daily as needed.     dorzolamide -timolol  (COSOPT ) 22.3-6.8 MG/ML ophthalmic solution Place 1 drop into the right eye 2 (two) times daily.      ezetimibe  (ZETIA ) 10 MG tablet Take 1 tablet (10 mg total) by mouth daily. 90 tablet 0   Ferric Maltol  (ACCRUFER ) 30 MG CAPS Take 1 capsule (30 mg total) by mouth in the morning and at bedtime. 180 capsule 0   Insulin  Glargine (BASAGLAR  KWIKPEN) 100 UNIT/ML Inject 60 Units into the skin daily. (Patient taking differently: Inject 50 Units into the skin at bedtime.) 54 mL 1   insulin  lispro (HUMALOG  KWIKPEN) 100 UNIT/ML KwikPen Inject 14 Units into the skin with breakfast, with lunch, and with evening meal. DIAL  14 UNITS AND INJECT UNDER THE SKIN THREE TIMES A DAY. 45 mL 0   Insulin  Pen Needle (PEN NEEDLES) 32G X 4 MM MISC Inject 1 pen  as directed 4 (four) times daily. Use to inject levemir  or novolog  as directed. 400 each 3   latanoprost  (XALATAN ) 0.005 %  ophthalmic solution Place 1 drop into both eyes at bedtime.      losartan  (COZAAR ) 25 MG tablet Take 1 tablet (25 mg total) by mouth daily. 90 tablet 0   MAGnesium -Oxide 400 (240 Mg) MG tablet Take 1 tablet (400 mg total) by mouth daily. 90 tablet 0   nebivolol  (BYSTOLIC ) 10 MG tablet Take 1 tablet by mouth once daily 90 tablet 3   ondansetron  (ZOFRAN ) 4 MG tablet Take 1 tablet (4 mg total) by mouth every 8 (eight) hours as needed for nausea or vomiting. 20 tablet 0   ONETOUCH VERIO test strip USE TO check blood glucose TWICE DAILY 200 strip 3   oxyCODONE  (OXY IR/ROXICODONE ) 5 MG immediate release tablet Take 1-2 tablets (5-10 mg total) by mouth every 4 (four) hours as needed for moderate pain (pain score 4-6). 30  tablet 0   pantoprazole  (PROTONIX ) 40 MG tablet Take 1 tablet (40 mg total) by mouth 2 (two) times daily. 60 tablet 0   rosuvastatin  (CRESTOR ) 5 MG tablet Take 1 tablet (5 mg total) by mouth daily. 90 tablet 3   Semaglutide , 1 MG/DOSE, (OZEMPIC , 1 MG/DOSE,) 4 MG/3ML SOPN Inject 1 mg into the skin once a week. Via [employer] patient assistance 9 mL 1   SYNJARDY  5-500 MG TABS Take 1 tablet by mouth twice daily (Patient taking differently: Take 1 tablet by mouth daily.) 180 tablet 0   XARELTO  20 MG TABS tablet Take 1 tablet by mouth once daily 90 tablet 0   diltiazem  (CARDIZEM  SR) 60 MG 12 hr capsule Take 1 capsule (60 mg total) by mouth 2 (two) times daily. Hold for blood pressure less than 120, or heart rate less than 60 60 capsule 0   No current facility-administered medications for this visit.     ROS:  See HPI  Physical Exam:  Vitals:   05/14/23 1332  BP: (!) 182/68  Pulse: 60  Resp: 18  Temp: 98.3 F (36.8 C)  TempSrc: Temporal  SpO2: 95%  Weight: 173 lb (78.5 kg)  Height: 5' 4 (1.626 m)    Incision: Right leg incisions completely healed Extremities: Multiphasic PT signal by Doppler; monophasic DP by Doppler; right lower leg edema Neuro: A&O  Assessment/Plan:  This is a 68 y.o. female who is s/p: Right iliofemoral endarterectomy with vein patch angioplasty and femoral to PT bypass with composite graft  Right leg continues to be well-perfused with brisk signals by Doppler.  Right great toe ulceration nearly healed.  Duplex demonstrates widely patent right leg bypass.  She continues to have right lower extremity edema.  Encouraged continued use of compression during the day.  She should also periodically elevate her leg when possible during the day.  Dr. Serene discussed with the patient that swelling may take at least 6 months to improve.  Encouraged her to continue to cleanse the right great toe wound daily with soap and water .  Okay to discontinue Plavix  at this time.  She  should continue Xarelto  and aspirin  daily.  She will return to the office in 6 months with a bypass duplex and ABI.  She knows to call/return office sooner with any questions or concerns.  I have refilled her diltiazem  prescription for 1 month.  Future refills should come from her PCP or cardiologist.   Donnice Sender, PA-C [facility] [phone]  Clinic MD:  Serene

## 2023-05-15 ENCOUNTER — Other Ambulatory Visit: Payer: Self-pay

## 2023-05-15 ENCOUNTER — Other Ambulatory Visit: Payer: Self-pay | Admitting: Internal Medicine

## 2023-05-15 DIAGNOSIS — K5904 Chronic idiopathic constipation: Secondary | ICD-10-CM

## 2023-05-15 DIAGNOSIS — E785 Hyperlipidemia, unspecified: Secondary | ICD-10-CM

## 2023-05-16 ENCOUNTER — Other Ambulatory Visit: Payer: Self-pay | Admitting: Internal Medicine

## 2023-05-16 DIAGNOSIS — D509 Iron deficiency anemia, unspecified: Secondary | ICD-10-CM

## 2023-05-16 DIAGNOSIS — K2100 Gastro-esophageal reflux disease with esophagitis, without bleeding: Secondary | ICD-10-CM

## 2023-05-16 MED ORDER — PANTOPRAZOLE SODIUM 40 MG PO TBEC: 40.0000 mg | DELAYED_RELEASE_TABLET | Freq: Two times a day (BID) | ORAL | 0 refills | Status: DC

## 2023-05-16 MED ORDER — ACCRUFER 30 MG PO CAPS: 1.0000 | ORAL_CAPSULE | Freq: Two times a day (BID) | ORAL | 0 refills | Status: DC

## 2023-05-21 ENCOUNTER — Other Ambulatory Visit (INDEPENDENT_AMBULATORY_CARE_PROVIDER_SITE_OTHER): Payer: PPO

## 2023-05-21 DIAGNOSIS — D509 Iron deficiency anemia, unspecified: Secondary | ICD-10-CM | POA: Diagnosis not present

## 2023-05-21 LAB — CBC
HCT: 32.2 % — ABNORMAL LOW (ref 36.0–46.0)
Hemoglobin: 9.8 g/dL — ABNORMAL LOW (ref 12.0–15.0)
MCHC: 30.4 g/dL (ref 30.0–36.0)
MCV: 77 fL — ABNORMAL LOW (ref 78.0–100.0)
Platelets: 357 10*3/uL (ref 150.0–400.0)
RBC: 4.18 Mil/uL (ref 3.87–5.11)
RDW: 20.7 % — ABNORMAL HIGH (ref 11.5–15.5)
WBC: 9.2 10*3/uL (ref 4.0–10.5)

## 2023-05-22 ENCOUNTER — Encounter: Payer: Self-pay | Admitting: Gastroenterology

## 2023-05-22 ENCOUNTER — Ambulatory Visit: Payer: Self-pay | Admitting: Internal Medicine

## 2023-05-22 ENCOUNTER — Other Ambulatory Visit: Payer: Self-pay

## 2023-05-22 DIAGNOSIS — L97512 Non-pressure chronic ulcer of other part of right foot with fat layer exposed: Secondary | ICD-10-CM | POA: Diagnosis not present

## 2023-05-22 DIAGNOSIS — I70213 Atherosclerosis of native arteries of extremities with intermittent claudication, bilateral legs: Secondary | ICD-10-CM

## 2023-05-22 DIAGNOSIS — I739 Peripheral vascular disease, unspecified: Secondary | ICD-10-CM | POA: Diagnosis not present

## 2023-05-22 DIAGNOSIS — Z89422 Acquired absence of other left toe(s): Secondary | ICD-10-CM | POA: Diagnosis not present

## 2023-05-22 DIAGNOSIS — Z9889 Other specified postprocedural states: Secondary | ICD-10-CM | POA: Diagnosis not present

## 2023-05-22 DIAGNOSIS — I7025 Atherosclerosis of native arteries of other extremities with ulceration: Secondary | ICD-10-CM

## 2023-05-22 NOTE — Telephone Encounter (Signed)
Copied from CRM [id number]. Topic: Clinical - Red Word Triage >> May 22, 2023  8:33 AM Irine Seal wrote: [facility] that prompted transfer to Nurse Triage: hypertension- latest bp reading 185/75, running high all last week.   Chief Complaint: BP Symptoms: elevated BP, asymptomatic Pertinent Negatives: Patient denies headaches, dizziness Disposition: [] ED /[] Urgent Care (no appt availability in office) / [x] Appointment(In office/virtual)/ []  [facility]/ [] Home Care/ [] Refused Recommended Disposition /[] [facility]/ []  Follow-up with PCP  Additional Notes: Patient stated she had an elevated BP last night and this morning. Last night, her BP was 185/75. This morning, her BP is 149/75. Patient stated she feels fine, no symptoms reported. Appointment scheduled for 1/23. Offered sooner appt with different provider, but patient declined. She stated she will wait to see Dr. Yetta Barre. Patient mentioned that she takes Diltiazem and has not missed any doses. She stated the provider took her off of Hydralazine, but she still took one yesterday because of her elevated BP. Patient is asking if she should start taking Hydralazine again daily?    Reason for Disposition  [1] Systolic BP  >= 130 OR Diastolic >= 80 AND [2] taking BP medications  Answer Assessment - Initial Assessment Questions 1. BLOOD PRESSURE: "What is the blood pressure?" "Did you take at least two measurements 5 minutes apart?"     149/75 5 minutes ago , 185/75 last night  2. HISTORY: "Do you have a history of high blood pressure?"     Yes  3. MEDICINES: "Are you taking any medicines for blood pressure?" "Have you missed any doses recently?"     Diltiazem 60 mg. Patient stated she has not missed any doses of Diltiazem. She stated she was on Hydralazine, but it was discontinued. She stated she took a Hydralazine last night because her BP was so high.  4. OTHER SYMPTOMS: "Do you have any symptoms?" (e.g., blurred vision,  chest pain, difficulty breathing, headache, weakness)     Feeling fine, no symptoms  Protocols used: Blood Pressure - High-A-AH

## 2023-05-22 NOTE — Telephone Encounter (Signed)
FYI. Please advise.

## 2023-05-22 NOTE — Telephone Encounter (Signed)
Noted  

## 2023-05-23 ENCOUNTER — Other Ambulatory Visit: Payer: Self-pay | Admitting: Internal Medicine

## 2023-05-24 ENCOUNTER — Ambulatory Visit: Payer: PPO | Admitting: Internal Medicine

## 2023-05-24 ENCOUNTER — Ambulatory Visit: Payer: Self-pay | Admitting: Internal Medicine

## 2023-05-24 NOTE — Telephone Encounter (Signed)
Chief Complaint: high blood pressure Symptoms: BP 185/74 with no other symptoms Frequency: prior dx of HTN, pt states she has been taking her prescribed medications but has concerns about her BP  Pertinent Negatives: Patient denies CP, SOB, dizziness, headache, blurry vision Disposition: [] ED /[] Urgent Care (no appt availability in office) / [x] Appointment(In office/virtual)/ []  [facility]/ [] Home Care/ [] Refused Recommended Disposition /[] [facility]/ []  Follow-up with PCP Additional Notes: Pt reports concerns about her high BP. Pt states she has been taking all of her prescribed medications. Pt adds "when I was in the hospital, they took me off hydralazine, but I took one today and yesterday and I think it's working." Pt states she has an appt scheduled for tomorrow, but wanted to move that appt up to today since she will be in the area. Per protocol, this RN scheduled the pt for today at 1540. Pt agreeable to the plan. Pt advised to call back for any worsening symptoms.   Reason for Disposition  Systolic BP  >= 180 OR Diastolic >= 110  Answer Assessment - Initial Assessment Questions 1. BLOOD PRESSURE: "What is the blood pressure?" "Did you take at least two measurements 5 minutes apart?"     Systolic 185/74 2. ONSET: "When did you take your blood pressure?"     Today 3. HOW: "How did you take your blood pressure?" (e.g., automatic home BP monitor, visiting nurse)     Automatic cuff 4. HISTORY: "Do you have a history of high blood pressure?"     Yes 5. MEDICINES: "Are you taking any medicines for blood pressure?" "Have you missed any doses recently?"     Yes, but "when I was in the hospital they took me off hydralazine, but I took it yesterday morning to try to get my pressure down. I think it worked a little bit." 6. OTHER SYMPTOMS: "Do you have any symptoms?" (e.g., blurred vision, chest pain, difficulty breathing, headache, weakness)     None  Protocols used:  Blood Pressure - High-A-AH

## 2023-05-24 NOTE — Progress Notes (Signed)
Subjective:    Patient ID: Theresa Harrington, female    DOB: July 19, 1955, 68 y.o.   MRN: [id number]      HPI Theresa Harrington is here for  Chief Complaint  Patient presents with   Acute Visit    Elevated BP (161/61 this morning patient reported), UTI symptoms dark colored urine and odor to urine    Elevated BP - BP at home 155/86, 176/76, 183/74, 135/72, 178/81  HR 56-64  She is compliant with a low sodium diet.  She was on hydralazine in the past, but was stopped and she did restart it-50 mg twice a day.   ? UTI - having dark urine and odor-denies any pain or increase in frequency.  Medications and allergies reviewed with patient and updated if appropriate.  Current Outpatient Medications on File Prior to Visit  Medication Sig Dispense Refill   aspirin EC 81 MG tablet Take 1 tablet (81 mg total) by mouth daily. Swallow whole. (Patient taking differently: Take 325 mg by mouth daily. Swallow whole.)     bisacodyl (DULCOLAX) 5 MG EC tablet Take 5 mg by mouth daily as needed for moderate constipation.     Blood Glucose Monitoring Suppl (ONETOUCH VERIO IQ SYSTEM) w/Device KIT USE TO CHECK SUGAR DAILY 1 kit 0   cetirizine (ZYRTEC) 10 MG tablet Take 10 mg by mouth daily as needed for allergies.     clopidogrel (PLAVIX) 75 MG tablet Take 1 tablet (75 mg total) by mouth daily. 30 tablet 3   diltiazem (CARDIZEM SR) 60 MG 12 hr capsule Take 1 capsule (60 mg total) by mouth 2 (two) times daily. Hold for blood pressure less than 120, or heart rate less than 60 60 capsule 0   Docusate Sodium (COLACE PO) Take 1 tablet by mouth daily as needed.     dorzolamide-timolol (COSOPT) 22.3-6.8 MG/ML ophthalmic solution Place 1 drop into the right eye 2 (two) times daily.      ezetimibe (ZETIA) 10 MG tablet Take 1 tablet by mouth once daily 90 tablet 0   Ferric Maltol (ACCRUFER) 30 MG CAPS Take 1 capsule (30 mg total) by mouth in the morning and at bedtime. 180 capsule 0   Insulin Glargine (BASAGLAR KWIKPEN) 100  UNIT/ML Inject 60 Units into the skin daily. (Patient taking differently: Inject 50 Units into the skin at bedtime.) 54 mL 1   insulin lispro (HUMALOG KWIKPEN) 100 UNIT/ML KwikPen Inject 14 Units into the skin with breakfast, with lunch, and with evening meal. DIAL 14 UNITS AND INJECT UNDER THE SKIN THREE TIMES A DAY. 45 mL 0   Insulin Pen Needle (PEN NEEDLES) 32G X 4 MM MISC Inject 1 pen  as directed 4 (four) times daily. Use to inject levemir or novolog as directed. 400 each 3   latanoprost (XALATAN) 0.005 % ophthalmic solution Place 1 drop into both eyes at bedtime.      losartan (COZAAR) 25 MG tablet Take 1 tablet (25 mg total) by mouth daily. 90 tablet 0   MAGNESIUM-OXIDE 400 (240 Mg) MG tablet Take 1 tablet by mouth once daily 90 tablet 0   nebivolol (BYSTOLIC) 10 MG tablet Take 1 tablet by mouth once daily 90 tablet 3   ondansetron (ZOFRAN) 4 MG tablet Take 1 tablet (4 mg total) by mouth every 8 (eight) hours as needed for nausea or vomiting. 20 tablet 0   ONETOUCH VERIO test strip USE TO check blood glucose TWICE DAILY 200 strip 3   oxyCODONE (OXY IR/ROXICODONE)  5 MG immediate release tablet Take 1-2 tablets (5-10 mg total) by mouth every 4 (four) hours as needed for moderate pain (pain score 4-6). 30 tablet 0   pantoprazole (PROTONIX) 40 MG tablet Take 1 tablet (40 mg total) by mouth 2 (two) times daily. 60 tablet 0   rosuvastatin (CRESTOR) 5 MG tablet Take 1 tablet (5 mg total) by mouth daily. 90 tablet 3   Semaglutide, 1 MG/DOSE, (OZEMPIC, 1 MG/DOSE,) 4 MG/3ML SOPN Inject 1 mg into the skin once a week. Via [employer] patient assistance 9 mL 1   SYNJARDY 5-500 MG TABS Take 1 tablet by mouth twice daily (Patient taking differently: Take 1 tablet by mouth daily.) 180 tablet 0   XARELTO 20 MG TABS tablet Take 1 tablet by mouth once daily 90 tablet 0   No current facility-administered medications on file prior to visit.    Review of Systems  Constitutional:  Negative for fever.   Respiratory:  Negative for shortness of breath.   Cardiovascular:  Negative for chest pain, palpitations and leg swelling.  Gastrointestinal:  Negative for abdominal pain and nausea.  Genitourinary:  Negative for dysuria, frequency, hematuria and urgency.       Dark urine, urine has odor  Musculoskeletal:  Negative for back pain.  Neurological:  Negative for dizziness, light-headedness and headaches.       Objective:   Vitals:   05/25/23 1449  BP: (!) 150/62  Pulse: 62  SpO2: 98%   BP Readings from Last 3 Encounters:  05/25/23 (!) 150/62  05/14/23 (!) 182/68  04/24/23 (!) 156/60   Wt Readings from Last 3 Encounters:  05/25/23 176 lb 9.6 oz (80.1 kg)  05/14/23 173 lb (78.5 kg)  04/24/23 175 lb 9.6 oz (79.7 kg)   Body mass index is 30.31 kg/m.    Physical Exam Constitutional:      General: She is not in acute distress.    Appearance: Normal appearance. She is not ill-appearing.  HENT:     Head: Normocephalic and atraumatic.  Eyes:     Conjunctiva/sclera: Conjunctivae normal.  Cardiovascular:     Rate and Rhythm: Normal rate and regular rhythm.     Heart sounds: Normal heart sounds.  Pulmonary:     Effort: Pulmonary effort is normal. No respiratory distress.     Breath sounds: Normal breath sounds. No wheezing.  Abdominal:     General: There is no distension.     Palpations: Abdomen is soft.     Tenderness: There is no abdominal tenderness. There is no right CVA tenderness, left CVA tenderness, guarding or rebound.  Musculoskeletal:     Cervical back: Neck supple.     Right lower leg: No edema.     Left lower leg: No edema.  Lymphadenopathy:     Cervical: No cervical adenopathy.  Skin:    General: Skin is warm and dry.     Findings: No rash.  Neurological:     Mental Status: She is alert. Mental status is at baseline.  Psychiatric:        Mood and Affect: Mood normal.        Behavior: Behavior normal.            Assessment & Plan:     Hypertension: Chronic Blood pressure not ideally controlled Continue diltiazem 60 mg twice daily, nebivolol 10 mg daily She restarted hydralazine 50 mg twice daily and continue for now Increase losartan to 100 mg daily She will continue to monitor  BP at home-if blood pressure goes too low she can stop the hydralazine Has cardiology appointment in a couple of weeks-will need BMP She will call sooner with any questions or concerns   Acute cystitis: Acute Urine dip consistent with UTI Will send urine for culture Take the antibiotic as prescribed.  Nitrofurantoin 100 mg twice daily x 7 days Take tylenol if needed.   Increase your water intake.  Call if no improvement

## 2023-05-25 ENCOUNTER — Encounter: Payer: Self-pay | Admitting: Internal Medicine

## 2023-05-25 ENCOUNTER — Ambulatory Visit (INDEPENDENT_AMBULATORY_CARE_PROVIDER_SITE_OTHER): Payer: PPO | Admitting: Internal Medicine

## 2023-05-25 VITALS — BP 158/64 | HR 62 | Ht 64.0 in | Wt 176.6 lb

## 2023-05-25 DIAGNOSIS — N3000 Acute cystitis without hematuria: Secondary | ICD-10-CM

## 2023-05-25 DIAGNOSIS — I10 Essential (primary) hypertension: Secondary | ICD-10-CM

## 2023-05-25 DIAGNOSIS — R3989 Other symptoms and signs involving the genitourinary system: Secondary | ICD-10-CM

## 2023-05-25 LAB — POCT URINALYSIS DIPSTICK
Bilirubin, UA: NEGATIVE
Blood, UA: NEGATIVE
Glucose, UA: POSITIVE — AB
Ketones, UA: NEGATIVE
Nitrite, UA: NEGATIVE
Protein, UA: POSITIVE — AB
Spec Grav, UA: 1.025 (ref 1.010–1.025)
Urobilinogen, UA: 0.2 U/dL
pH, UA: 6 (ref 5.0–8.0)

## 2023-05-25 MED ORDER — HYDRALAZINE HCL 50 MG PO TABS: 50.0000 mg | ORAL_TABLET | Freq: Two times a day (BID) | ORAL | Status: DC

## 2023-05-25 MED ORDER — LOSARTAN POTASSIUM 100 MG PO TABS: 100.0000 mg | ORAL_TABLET | Freq: Every day | ORAL | 1 refills | Status: DC

## 2023-05-25 MED ORDER — NITROFURANTOIN MONOHYD MACRO 100 MG PO CAPS: 100.0000 mg | ORAL_CAPSULE | Freq: Two times a day (BID) | ORAL | 0 refills | Status: DC

## 2023-05-25 NOTE — Patient Instructions (Addendum)
       Medications changes include :   increase losartan to 100 mg daily.   Start nitrofurantoin for your urine infection.         Return if symptoms worsen or fail to improve.

## 2023-05-26 LAB — URINE CULTURE: Result:: NO GROWTH

## 2023-05-27 ENCOUNTER — Encounter: Payer: Self-pay | Admitting: Internal Medicine

## 2023-06-01 ENCOUNTER — Other Ambulatory Visit: Payer: Self-pay | Admitting: Internal Medicine

## 2023-06-01 DIAGNOSIS — I4891 Unspecified atrial fibrillation: Secondary | ICD-10-CM

## 2023-06-07 ENCOUNTER — Encounter: Payer: Self-pay | Admitting: Internal Medicine

## 2023-06-07 ENCOUNTER — Ambulatory Visit: Payer: PPO | Admitting: Internal Medicine

## 2023-06-07 VITALS — BP 172/74 | HR 63 | Temp 97.6°F | Resp 16 | Ht 64.0 in | Wt 172.2 lb

## 2023-06-07 DIAGNOSIS — E538 Deficiency of other specified B group vitamins: Secondary | ICD-10-CM | POA: Diagnosis not present

## 2023-06-07 DIAGNOSIS — Z794 Long term (current) use of insulin: Secondary | ICD-10-CM | POA: Diagnosis not present

## 2023-06-07 DIAGNOSIS — E119 Type 2 diabetes mellitus without complications: Secondary | ICD-10-CM

## 2023-06-07 DIAGNOSIS — G63 Polyneuropathy in diseases classified elsewhere: Secondary | ICD-10-CM | POA: Diagnosis not present

## 2023-06-07 DIAGNOSIS — I10 Essential (primary) hypertension: Secondary | ICD-10-CM

## 2023-06-07 DIAGNOSIS — I7025 Atherosclerosis of native arteries of other extremities with ulceration: Secondary | ICD-10-CM

## 2023-06-07 LAB — POCT GLYCOSYLATED HEMOGLOBIN (HGB A1C): Hemoglobin A1C: 6.3 % — AB (ref 4.0–5.6)

## 2023-06-07 MED ORDER — INDAPAMIDE 1.25 MG PO TABS: 1.2500 mg | ORAL_TABLET | Freq: Every day | ORAL | 0 refills | Status: DC

## 2023-06-07 MED ORDER — CYANOCOBALAMIN 1000 MCG/ML IJ SOLN
1000.0000 ug | Freq: Once | INTRAMUSCULAR | Status: AC
  Administered 2023-06-07: 1000 ug via INTRAMUSCULAR

## 2023-06-07 NOTE — Progress Notes (Signed)
 Subjective:  Patient ID: Theresa Harrington, female    DOB: [birth year]  Age: 68 y.o. MRN: [id number]  CC: Hypertension and Diabetes   HPI Theresa Harrington presents for f/up ---   Discussed the use of AI scribe software for clinical note transcription with the patient, who gave verbal consent to proceed.  History of Present Illness   Theresa Harrington is a 68 year old female with hypertension who presents for blood pressure management.  She is on the maximum dose of losartan , 100 mg daily, and a moderate dose of hydralazine  three times a day for hypertension. She experiences occasional slight headaches but no other symptoms typically associated with high blood pressure.  She reports leg swelling, which she attributes to a recent surgery. The swelling has decreased somewhat but has not completely resolved. She is not taking any diuretics. No nausea or vomiting since discontinuing an iron  supplement that previously caused these symptoms.       Outpatient Medications Prior to Visit  Medication Sig Dispense Refill   aspirin  EC 81 MG tablet Take 1 tablet (81 mg total) by mouth daily. Swallow whole. (Patient taking differently: Take 325 mg by mouth daily. Swallow whole.)     bisacodyl  (DULCOLAX) 5 MG EC tablet Take 5 mg by mouth daily as needed for moderate constipation.     Blood Glucose Monitoring Suppl (ONETOUCH VERIO IQ SYSTEM) w/Device KIT USE TO CHECK SUGAR DAILY 1 kit 0   cetirizine  (ZYRTEC ) 10 MG tablet Take 10 mg by mouth daily as needed for allergies.     clopidogrel  (PLAVIX ) 75 MG tablet Take 1 tablet (75 mg total) by mouth daily. 30 tablet 3   diltiazem  (CARDIZEM  SR) 60 MG 12 hr capsule Take 1 capsule (60 mg total) by mouth 2 (two) times daily. Hold for blood pressure less than 120, or heart rate less than 60 60 capsule 0   Docusate Sodium  (COLACE PO) Take 1 tablet by mouth daily as needed.     dorzolamide -timolol  (COSOPT ) 22.3-6.8 MG/ML ophthalmic solution Place 1 drop into the right eye 2 (two)  times daily.      ezetimibe  (ZETIA ) 10 MG tablet Take 1 tablet by mouth once daily 90 tablet 0   Ferric Maltol  (ACCRUFER ) 30 MG CAPS Take 1 capsule (30 mg total) by mouth in the morning and at bedtime. 180 capsule 0   hydrALAZINE  (APRESOLINE ) 50 MG tablet Take 1 tablet (50 mg total) by mouth 2 (two) times daily.     Insulin  Glargine (BASAGLAR  KWIKPEN) 100 UNIT/ML Inject 60 Units into the skin daily. (Patient taking differently: Inject 50 Units into the skin at bedtime.) 54 mL 1   insulin  lispro (HUMALOG  KWIKPEN) 100 UNIT/ML KwikPen Inject 14 Units into the skin with breakfast, with lunch, and with evening meal. DIAL  14 UNITS AND INJECT UNDER THE SKIN THREE TIMES A DAY. 45 mL 0   Insulin  Pen Needle (PEN NEEDLES) 32G X 4 MM MISC Inject 1 pen  as directed 4 (four) times daily. Use to inject levemir  or novolog  as directed. 400 each 3   latanoprost  (XALATAN ) 0.005 % ophthalmic solution Place 1 drop into both eyes at bedtime.      losartan  (COZAAR ) 100 MG tablet Take 1 tablet (100 mg total) by mouth daily. 30 tablet 1   MAGNESIUM -OXIDE 400 (240 Mg) MG tablet Take 1 tablet by mouth once daily 90 tablet 0   nebivolol  (BYSTOLIC ) 10 MG tablet Take 1 tablet by mouth once daily 90 tablet 3  ONETOUCH VERIO test strip USE TO check blood glucose TWICE DAILY 200 strip 3   pantoprazole  (PROTONIX ) 40 MG tablet Take 1 tablet (40 mg total) by mouth 2 (two) times daily. 60 tablet 0   rosuvastatin  (CRESTOR ) 5 MG tablet Take 1 tablet (5 mg total) by mouth daily. 90 tablet 3   Semaglutide , 1 MG/DOSE, (OZEMPIC , 1 MG/DOSE,) 4 MG/3ML SOPN Inject 1 mg into the skin once a week. Via [employer] patient assistance 9 mL 1   SYNJARDY  5-500 MG TABS Take 1 tablet by mouth twice daily (Patient taking differently: Take 1 tablet by mouth daily.) 180 tablet 0   XARELTO  20 MG TABS tablet Take 1 tablet by mouth once daily 90 tablet 0   nitrofurantoin , macrocrystal-monohydrate, (MACROBID ) 100 MG capsule Take 1 capsule (100 mg total) by  mouth 2 (two) times daily. 14 capsule 0   ondansetron  (ZOFRAN ) 4 MG tablet Take 1 tablet (4 mg total) by mouth every 8 (eight) hours as needed for nausea or vomiting. 20 tablet 0   oxyCODONE  (OXY IR/ROXICODONE ) 5 MG immediate release tablet Take 1-2 tablets (5-10 mg total) by mouth every 4 (four) hours as needed for moderate pain (pain score 4-6). 30 tablet 0   No facility-administered medications prior to visit.    ROS Review of Systems  Constitutional: Negative.  Negative for appetite change, fatigue and unexpected weight change.  HENT: Negative.    Eyes: Negative.   Respiratory:  Negative for cough, chest tightness, shortness of breath and wheezing.   Cardiovascular:  Positive for leg swelling. Negative for chest pain and palpitations.  Gastrointestinal:  Negative for abdominal pain, constipation, diarrhea, nausea and vomiting.  Genitourinary:  Negative for difficulty urinating.  Musculoskeletal:  Negative for arthralgias and myalgias.  Neurological: Negative.  Negative for dizziness, weakness and headaches.  Hematological:  Negative for adenopathy. Does not bruise/bleed easily.  Psychiatric/Behavioral: Negative.      Objective:  BP (!) 172/74 (BP Location: Left Arm, Patient Position: Sitting, Cuff Size: Normal)   Pulse 63   Temp 97.6 F (36.4 C) (Oral)   Resp 16   Ht 5' 4 (1.626 m)   Wt 172 lb 3.2 oz (78.1 kg)   LMP  (LMP Unknown)   SpO2 98%   BMI 29.56 kg/m   BP Readings from Last 3 Encounters:  06/07/23 (!) 172/74  05/25/23 (!) 158/64  05/14/23 (!) 182/68    Wt Readings from Last 3 Encounters:  06/07/23 172 lb 3.2 oz (78.1 kg)  05/25/23 176 lb 9.6 oz (80.1 kg)  05/14/23 173 lb (78.5 kg)    Physical Exam Vitals reviewed.  Constitutional:      Appearance: She is not ill-appearing.  HENT:     Mouth/Throat:     Mouth: Mucous membranes are moist.  Eyes:     General: No scleral icterus. Cardiovascular:     Rate and Rhythm: Normal rate and regular rhythm.      Heart sounds: Murmur heard.     Systolic murmur is present with a grade of 1/6.     No friction rub. No gallop.  Pulmonary:     Effort: Pulmonary effort is normal.     Breath sounds: No stridor. No wheezing, rhonchi or rales.  Abdominal:     General: Abdomen is flat.     Palpations: There is no mass.     Tenderness: There is no abdominal tenderness. There is no guarding.     Hernia: No hernia is present.  Musculoskeletal:  Cervical back: Neck supple.     Right lower leg: Pitting Edema (trace) present.     Left lower leg: Pitting Edema (trace) present.  Lymphadenopathy:     Cervical: No cervical adenopathy.  Skin:    General: Skin is warm and dry.     Findings: No lesion.  Neurological:     General: No focal deficit present.     Mental Status: She is alert. Mental status is at baseline.  Psychiatric:        Mood and Affect: Mood normal.        Behavior: Behavior normal.     Lab Results  Component Value Date   WBC 9.2 05/21/2023   HGB 9.8 (L) 05/21/2023   HCT 32.2 (L) 05/21/2023   PLT 357.0 05/21/2023   GLUCOSE 199 (H) 04/24/2023   CHOL 92 03/10/2023   TRIG 81 03/10/2023   HDL 38 (L) 03/10/2023   LDLDIRECT 160.2 03/01/2010   LDLCALC 38 03/10/2023   ALT 11 04/07/2023   AST 14 (L) 04/07/2023   NA 140 04/24/2023   K 4.1 04/24/2023   CL 109 04/24/2023   CREATININE 0.75 04/24/2023   BUN 11 04/24/2023   CO2 23 04/24/2023   TSH 0.634 04/07/2023   INR 1.0 03/09/2023   HGBA1C 6.3 (A) 06/07/2023   MICROALBUR <0.7 02/20/2023    VAS US  LOWER EXTREMITY BYPASS GRAFT DUPLEX Result Date: 05/14/2023 LOWER EXTREMITY ARTERIAL DUPLEX STUDY Patient Name:  Theresa Harrington  Date of Exam:   05/14/2023 Medical Rec #: [id number]   Accession #:    [id number] Date of Birth: 09/08/1955  Patient Gender: F Patient Age:   85 years Exam Location:  [facility] Procedure:      VAS US  LOWER EXTREMITY BYPASS GRAFT DUPLEX Referring Phys: GAILE NEW  --------------------------------------------------------------------------------  Indications: Peripheral artery disease, and Right Leg Swelling. High Risk         Hypertension, hyperlipidemia, Diabetes, past history of Factors:          smoking.  Vascular Interventions: 03/09/2023                         1: Right iliofemoral endarterectomy with vein patch                         angioplasty                         2: Right femoral to posterior tibial artery bypass graft                         with composite graft (6 mm external ring PTFE proximally                         and saphenous vein to the posterior tibial artery)                         3: Harvest of right saphenous vein. Current ABI:            Rt ABI: [place]                         Lt ABI: 0.81 Performing Technologist: Camellia Bolder RVT  Examination Guidelines: A complete evaluation includes B-mode imaging, spectral Doppler, color Doppler, and power Doppler as  needed of all accessible portions of each vessel. Bilateral testing is considered an integral part of a complete examination. Limited examinations for reoccurring indications may be performed as noted.  Right Graft #1: Fem-Distal Tibial +------------------+--------+---------------+--------+-------------------------+                   PSV cm/sStenosis       WaveformComments                  +------------------+--------+---------------+--------+-------------------------+ Inflow            266     50-74% stenosisbiphasicRight distal EIA          +------------------+--------+---------------+--------+-------------------------+ Prox Anastomosis  50                     biphasic                          +------------------+--------+---------------+--------+-------------------------+ Proximal Graft    48                     biphasic                          +------------------+--------+---------------+--------+-------------------------+ Mid Graft         56                      biphasic                          +------------------+--------+---------------+--------+-------------------------+ Distal Graft      58                     biphasic                          +------------------+--------+---------------+--------+-------------------------+ Distal Anastomosis227     50-70% stenosisbiphasicVessel diameter                                                            reduction: 0.60 cm to                                                      0.20 cm                   +------------------+--------+---------------+--------+-------------------------+ Outflow           268     50-74% stenosisbiphasic                          +------------------+--------+---------------+--------+-------------------------+  Summary: Right: Patent femoral to distal tibial artery bypass graft with elevated velocities at the external iliac artery inflow (50-74%), distal anastmosis (50-74%) and outflow (50-74%).  See table(s) above for measurements and observations. Electronically signed by Gaile New MD on 05/14/2023 at 1:39:16 PM.    Final    VAS US  ABI WITH/WO TBI Result Date: 05/14/2023  LOWER EXTREMITY DOPPLER STUDY Patient Name:  Theresa Harrington  Date of Exam:   05/14/2023 Medical Rec #:  [id number]   Accession #:    [id number] Date of Birth: Apr 14, 1956  Patient Gender: F Patient Age:   3 years Exam Location:  [facility] Procedure:      VAS US  ABI WITH/WO TBI Referring Phys: GAILE NEW --------------------------------------------------------------------------------  Indications: Peripheral artery disease. High Risk Factors: Hypertension, hyperlipidemia, past history of smoking.  Vascular Interventions: 03/09/2023                         1: Right iliofemoral endarterectomy with vein patch                         angioplasty                         2: Right femoral to posterior tibial artery bypass graft                         with composite graft (6 mm external  ring PTFE proximally                         and saphenous vein to the posterior tibial artery)                         3: Harvest of right saphenous vein. Comparison Study: 02/28/2023                   Rt ABI: 0.75 TBI: 0.18                   Lt ABI: 0.84 TBI: 0.37 Performing Technologist: Delice Locus RVT  Examination Guidelines: A complete evaluation includes at minimum, Doppler waveform signals and systolic blood pressure reading at the level of bilateral brachial, anterior tibial, and posterior tibial arteries, when vessel segments are accessible. Bilateral testing is considered an integral part of a complete examination. Photoelectric Plethysmograph (PPG) waveforms and toe systolic pressure readings are included as required and additional duplex testing as needed. Limited examinations for reoccurring indications may be performed as noted.  ABI Findings: +---------+------------------+-----+----------+----------------+ Right    Rt Pressure (mmHg)IndexWaveform  Comment          +---------+------------------+-----+----------+----------------+ Brachial 212                                               +---------+------------------+-----+----------+----------------+ PTA                             monophasicNon-compressible +---------+------------------+-----+----------+----------------+ DP                              monophasicNon-compressible +---------+------------------+-----+----------+----------------+ [place] [id number]               0.54 Abnormal                   +---------+------------------+-----+----------+----------------+ +---------+------------------+-----+----------+-------+ Left     Lt Pressure (mmHg)IndexWaveform  Comment +---------+------------------+-----+----------+-------+ Brachial 216                                      +---------+------------------+-----+----------+-------+  PTA      176               0.81 monophasic         +---------+------------------+-----+----------+-------+ DP       165               0.76 monophasic        +---------+------------------+-----+----------+-------+ Great Toe102               0.47 Abnormal          +---------+------------------+-----+----------+-------+ +-------+-----------+-----------+------------+------------+ ABI/TBIToday's ABIToday's TBIPrevious ABIPrevious TBI +-------+-----------+-----------+------------+------------+ Right  [place]         0.54       0.75        0.18         +-------+-----------+-----------+------------+------------+ Left   0.81       0.47       0.84        0.37         +-------+-----------+-----------+------------+------------+  Right TBIs appear increased compared to prior study on 02/28/23.  Summary: Right: Resting right ankle-brachial index indicates noncompressible right lower extremity arteries. The right toe-brachial index is abnormal. Left: Resting left ankle-brachial index indicates mild left lower extremity arterial disease. The left toe-brachial index is abnormal. *See table(s) above for measurements and observations.  Electronically signed by Gaile New MD on 05/14/2023 at 1:38:09 PM.    Final     Assessment & Plan:  Vitamin B12 deficiency neuropathy (HCC) -     Cyanocobalamin   Insulin -requiring or dependent type II diabetes mellitus (HCC) -     POCT glycosylated hemoglobin (Hb A1C)  Atherosclerosis of native arteries of the extremities with ulceration (HCC)  Essential hypertension, benign- Her blood pressure is not at goal and she has lower extremity edema.  Will add a thiazide diuretic to her current regimen. -     Indapamide ; Take 1 tablet (1.25 mg total) by mouth daily.  Dispense: 90 tablet; Refill: 0     Follow-up: Return in about 3 months (around 09/04/2023).  Debby Molt, MD

## 2023-06-07 NOTE — Telephone Encounter (Signed)
 CONTINUED ON 03/13/23 OUTREACH [employer]

## 2023-06-07 NOTE — Patient Instructions (Signed)
 Hypertension, Adult High blood pressure (hypertension) is when the force of blood pumping through the arteries is too strong. The arteries are the blood vessels that carry blood from the heart throughout the body. Hypertension forces the heart to work harder to pump blood and may cause arteries to become narrow or stiff. Untreated or uncontrolled hypertension can lead to a heart attack, heart failure, a stroke, kidney disease, and other problems. A blood pressure reading consists of a higher number over a lower number. Ideally, your blood pressure should be below 120/80. The first ("top") number is called the systolic pressure. It is a measure of the pressure in your arteries as your heart beats. The second ("bottom") number is called the diastolic pressure. It is a measure of the pressure in your arteries as the heart relaxes. What are the causes? The exact cause of this condition is not known. There are some conditions that result in high blood pressure. What increases the risk? Certain factors may make you more likely to develop high blood pressure. Some of these risk factors are under your control, including: Smoking. Not getting enough exercise or physical activity. Being overweight. Having too much fat, sugar, calories, or salt (sodium) in your diet. Drinking too much alcohol. Other risk factors include: Having a personal history of heart disease, diabetes, high cholesterol, or kidney disease. Stress. Having a family history of high blood pressure and high cholesterol. Having obstructive sleep apnea. Age. The risk increases with age. What are the signs or symptoms? High blood pressure may not cause symptoms. Very high blood pressure (hypertensive crisis) may cause: Headache. Fast or irregular heartbeats (palpitations). Shortness of breath. Nosebleed. Nausea and vomiting. Vision changes. Severe chest pain, dizziness, and seizures. How is this diagnosed? This condition is diagnosed by  measuring your blood pressure while you are seated, with your arm resting on a flat surface, your legs uncrossed, and your feet flat on the floor. The cuff of the blood pressure monitor will be placed directly against the skin of your upper arm at the level of your heart. Blood pressure should be measured at least twice using the same arm. Certain conditions can cause a difference in blood pressure between your right and left arms. If you have a high blood pressure reading during one visit or you have normal blood pressure with other risk factors, you may be asked to: Return on a different day to have your blood pressure checked again. Monitor your blood pressure at home for 1 week or longer. If you are diagnosed with hypertension, you may have other blood or imaging tests to help your health care provider understand your overall risk for other conditions. How is this treated? This condition is treated by making healthy lifestyle changes, such as eating healthy foods, exercising more, and reducing your alcohol intake. You may be referred for counseling on a healthy diet and physical activity. Your health care provider may prescribe medicine if lifestyle changes are not enough to get your blood pressure under control and if: Your systolic blood pressure is above 130. Your diastolic blood pressure is above 80. Your personal target blood pressure may vary depending on your medical conditions, your age, and other factors. Follow these instructions at home: Eating and drinking  Eat a diet that is high in fiber and potassium, and low in sodium, added sugar, and fat. An example of this eating plan is called the DASH diet. DASH stands for Dietary Approaches to Stop Hypertension. To eat this way: Eat  plenty of fresh fruits and vegetables. Try to fill one half of your plate at each meal with fruits and vegetables. Eat whole grains, such as whole-wheat pasta, brown rice, or whole-grain bread. Fill about one  fourth of your plate with whole grains. Eat or drink low-fat dairy products, such as skim milk or low-fat yogurt. Avoid fatty cuts of meat, processed or cured meats, and poultry with skin. Fill about one fourth of your plate with lean proteins, such as fish, chicken without skin, beans, eggs, or tofu. Avoid pre-made and processed foods. These tend to be higher in sodium, added sugar, and fat. Reduce your daily sodium intake. Many people with hypertension should eat less than 1,500 mg of sodium a day. Do not drink alcohol if: Your health care provider tells you not to drink. You are pregnant, may be pregnant, or are planning to become pregnant. If you drink alcohol: Limit how much you have to: 0-1 drink a day for women. 0-2 drinks a day for men. Know how much alcohol is in your drink. In the [place] one drink equals one 12 oz bottle of beer (355 mL), one 5 oz glass of wine (148 mL), or one 1 oz glass of hard liquor (44 mL). Lifestyle  Work with your health care provider to maintain a healthy body weight or to lose weight. Ask what an ideal weight is for you. Get at least 30 minutes of exercise that causes your heart to beat faster (aerobic exercise) most days of the week. Activities may include walking, swimming, or biking. Include exercise to strengthen your muscles (resistance exercise), such as Pilates or lifting weights, as part of your weekly exercise routine. Try to do these types of exercises for 30 minutes at least 3 days a week. Do not use any products that contain nicotine or tobacco. These products include cigarettes, chewing tobacco, and vaping devices, such as e-cigarettes. If you need help quitting, ask your health care provider. Monitor your blood pressure at home as told by your health care provider. Keep all follow-up visits. This is important. Medicines Take over-the-counter and prescription medicines only as told by your health care provider. Follow directions carefully. Blood  pressure medicines must be taken as prescribed. Do not skip doses of blood pressure medicine. Doing this puts you at risk for problems and can make the medicine less effective. Ask your health care provider about side effects or reactions to medicines that you should watch for. Contact a health care provider if you: Think you are having a reaction to a medicine you are taking. Have headaches that keep coming back (recurring). Feel dizzy. Have swelling in your ankles. Have trouble with your vision. Get help right away if you: Develop a severe headache or confusion. Have unusual weakness or numbness. Feel faint. Have severe pain in your chest or abdomen. Vomit repeatedly. Have trouble breathing. These symptoms may be an emergency. Get help right away. Call 911. Do not wait to see if the symptoms will go away. Do not drive yourself to the hospital. Summary Hypertension is when the force of blood pumping through your arteries is too strong. If this condition is not controlled, it may put you at risk for serious complications. Your personal target blood pressure may vary depending on your medical conditions, your age, and other factors. For most people, a normal blood pressure is less than 120/80. Hypertension is treated with lifestyle changes, medicines, or a combination of both. Lifestyle changes include losing weight, eating a healthy,  low-sodium diet, exercising more, and limiting alcohol. This information is not intended to replace advice given to you by your health care provider. Make sure you discuss any questions you have with your health care provider. Document Revised: 02/22/2021 Document Reviewed: 02/22/2021 Elsevier Patient Education  2024 [employer].

## 2023-06-12 ENCOUNTER — Ambulatory Visit: Payer: PPO | Admitting: Adult Health

## 2023-06-12 NOTE — Telephone Encounter (Signed)
Per [employer] pt has been APPROVED for 2025 pt is aware,and will follow up with doctors office for refills.

## 2023-06-13 DIAGNOSIS — Z8631 Personal history of diabetic foot ulcer: Secondary | ICD-10-CM | POA: Diagnosis not present

## 2023-06-13 DIAGNOSIS — Z89422 Acquired absence of other left toe(s): Secondary | ICD-10-CM | POA: Diagnosis not present

## 2023-06-13 DIAGNOSIS — I739 Peripheral vascular disease, unspecified: Secondary | ICD-10-CM | POA: Diagnosis not present

## 2023-06-13 DIAGNOSIS — E1142 Type 2 diabetes mellitus with diabetic polyneuropathy: Secondary | ICD-10-CM | POA: Diagnosis not present

## 2023-06-18 ENCOUNTER — Other Ambulatory Visit (HOSPITAL_COMMUNITY): Payer: Self-pay

## 2023-06-20 ENCOUNTER — Other Ambulatory Visit: Payer: Self-pay | Admitting: Internal Medicine

## 2023-06-20 DIAGNOSIS — E1142 Type 2 diabetes mellitus with diabetic polyneuropathy: Secondary | ICD-10-CM | POA: Diagnosis not present

## 2023-06-20 DIAGNOSIS — Z89422 Acquired absence of other left toe(s): Secondary | ICD-10-CM | POA: Diagnosis not present

## 2023-06-20 DIAGNOSIS — E1151 Type 2 diabetes mellitus with diabetic peripheral angiopathy without gangrene: Secondary | ICD-10-CM

## 2023-06-20 DIAGNOSIS — I739 Peripheral vascular disease, unspecified: Secondary | ICD-10-CM | POA: Diagnosis not present

## 2023-06-20 DIAGNOSIS — L84 Corns and callosities: Secondary | ICD-10-CM | POA: Diagnosis not present

## 2023-06-27 ENCOUNTER — Telehealth: Payer: Self-pay

## 2023-06-27 ENCOUNTER — Other Ambulatory Visit: Payer: Self-pay | Admitting: Cardiology

## 2023-06-27 NOTE — Telephone Encounter (Signed)
 PAP: Patient assistance application for Ozempic WAS approved by PAP Companies: NovoNordisk from FOR OZEMPIC 07/05/2022 to 07/05/2023.  PLEASE BE ADVISE PT RE-ENROLLMENT SHOULD BE DONE 30 DAY PRIOR TO EXPIRE.

## 2023-06-28 ENCOUNTER — Other Ambulatory Visit (HOSPITAL_COMMUNITY): Payer: Self-pay

## 2023-06-28 NOTE — Telephone Encounter (Signed)
 PAP: Application for Ozempic has been submitted to [employer], online  PT PAP EXPIRES 07/05/2023  Provider portion of application HAS BEEN BEEN faxed to provider's office.

## 2023-06-30 ENCOUNTER — Other Ambulatory Visit: Payer: Self-pay | Admitting: Internal Medicine

## 2023-06-30 DIAGNOSIS — K2100 Gastro-esophageal reflux disease with esophagitis, without bleeding: Secondary | ICD-10-CM

## 2023-07-02 ENCOUNTER — Telehealth: Payer: Self-pay

## 2023-07-02 NOTE — Telephone Encounter (Signed)
 Following up on pt PAP NOVO NORDISK (Ozempic) re send providers portion to the office waiting on provider fax to submitted to company. Will follow up in a few days

## 2023-07-03 ENCOUNTER — Ambulatory Visit: Payer: PPO | Admitting: Physician Assistant

## 2023-07-03 NOTE — Telephone Encounter (Signed)
 Received provider portion of application for [employer] has fax in to [employer] will follow up in a few day with company.

## 2023-07-05 ENCOUNTER — Telehealth: Payer: Self-pay

## 2023-07-05 NOTE — Telephone Encounter (Signed)
 Patient was identified as falling into the [place] Measure - Diabetes.   Patient was: Appointment scheduled for lab or office visit for A1c. Patient is on a 6 month schedule, last checked 06/07/2023

## 2023-07-06 ENCOUNTER — Ambulatory Visit (INDEPENDENT_AMBULATORY_CARE_PROVIDER_SITE_OTHER): Admitting: Family Medicine

## 2023-07-06 ENCOUNTER — Encounter: Payer: Self-pay | Admitting: Family Medicine

## 2023-07-06 VITALS — BP 136/60 | HR 64 | Temp 99.0°F | Ht 64.0 in | Wt 171.8 lb

## 2023-07-06 DIAGNOSIS — E538 Deficiency of other specified B group vitamins: Secondary | ICD-10-CM | POA: Diagnosis not present

## 2023-07-06 DIAGNOSIS — G63 Polyneuropathy in diseases classified elsewhere: Secondary | ICD-10-CM

## 2023-07-06 DIAGNOSIS — I4892 Unspecified atrial flutter: Secondary | ICD-10-CM | POA: Diagnosis not present

## 2023-07-06 DIAGNOSIS — I10 Essential (primary) hypertension: Secondary | ICD-10-CM | POA: Diagnosis not present

## 2023-07-06 DIAGNOSIS — R051 Acute cough: Secondary | ICD-10-CM

## 2023-07-06 DIAGNOSIS — I4819 Other persistent atrial fibrillation: Secondary | ICD-10-CM

## 2023-07-06 DIAGNOSIS — J329 Chronic sinusitis, unspecified: Secondary | ICD-10-CM | POA: Diagnosis not present

## 2023-07-06 DIAGNOSIS — B9689 Other specified bacterial agents as the cause of diseases classified elsewhere: Secondary | ICD-10-CM | POA: Diagnosis not present

## 2023-07-06 MED ORDER — CYANOCOBALAMIN 1000 MCG/ML IJ SOLN
1000.0000 ug | Freq: Once | INTRAMUSCULAR | Status: AC
  Administered 2023-07-06: 1000 ug via INTRAMUSCULAR

## 2023-07-06 MED ORDER — HYDROCODONE BIT-HOMATROP MBR 5-1.5 MG/5ML PO SOLN: 5.0000 mL | Freq: Three times a day (TID) | ORAL | 0 refills | Status: DC | PRN

## 2023-07-06 MED ORDER — DILTIAZEM HCL ER 60 MG PO CP12: 60.0000 mg | ORAL_CAPSULE | Freq: Two times a day (BID) | ORAL | 1 refills | Status: DC

## 2023-07-06 MED ORDER — LOSARTAN POTASSIUM 100 MG PO TABS: 100.0000 mg | ORAL_TABLET | Freq: Every day | ORAL | 1 refills | Status: DC

## 2023-07-06 NOTE — Progress Notes (Signed)
 Acute Office Visit  Subjective:     Patient ID: Theresa Harrington, female    DOB: August 19, 1955, 68 y.o.   MRN: [id number]  Chief Complaint  Patient presents with   Acute Visit    Cough present for about 2 weeks, had some left over prescribed cough syrup that has helped a little bit. Sinus pressure   Hypertension    Ranging between 130-150's over 60-70s    Hypertension   Patient is in today for evaluation of sinus pain and pressure, cough, purulent nasal sputum, coughing up dark phlegm as well. Has bottle of cefdinir with her, states she never did take this medication with her last sinus infection.  Inquiring about whether she can take this medication today. Requesting room cough syrup today. States that he refill today. Denies. Medical history as outlined below.  ROS Per HPI      Objective:    BP 136/60 (BP Location: Left Arm, Patient Position: Sitting)   Pulse 64   Temp 99 F (37.2 C) (Temporal)   Ht 5\' 4"  (1.626 m)   Wt 171 lb 12.8 oz (77.9 kg)   LMP  (LMP Unknown)   SpO2 99%   BMI 29.49 kg/m    Physical Exam Vitals and nursing note reviewed.  Constitutional:      General: She is not in acute distress. HENT:     Head: Normocephalic and atraumatic.     Nose: No congestion.     Right Sinus: Frontal sinus tenderness present.     Left Sinus: Frontal sinus tenderness present.     Mouth/Throat:     Mouth: Mucous membranes are moist.     Pharynx: Oropharynx is clear. No oropharyngeal exudate or posterior oropharyngeal erythema.  Eyes:     Extraocular Movements: Extraocular movements intact.  Cardiovascular:     Rate and Rhythm: Normal rate and regular rhythm.  Pulmonary:     Effort: Pulmonary effort is normal. No respiratory distress.     Breath sounds: No wheezing, rhonchi or rales.     Comments: Dry cough Musculoskeletal:     Cervical back: Normal range of motion and neck supple.  Lymphadenopathy:     Cervical: No cervical adenopathy.  Skin:    General:  Skin is warm and dry.  Neurological:     Mental Status: She is alert.     No results found for any visits on 07/06/23.      Assessment & Plan:  1. Vitamin B12 deficiency neuropathy (HCC) (Primary)  - cyanocobalamin (VITAMIN B12) injection 1,000 mcg  2. Atrial flutter, paroxysmal (HCC)  - diltiazem (CARDIZEM SR) 60 MG 12 hr capsule; Take 1 capsule (60 mg total) by mouth 2 (two) times daily. Hold for blood pressure less than 120, or heart rate less than 60  Dispense: 180 capsule; Refill: 1  3. Persistent atrial fibrillation (HCC)  - diltiazem (CARDIZEM SR) 60 MG 12 hr capsule; Take 1 capsule (60 mg total) by mouth 2 (two) times daily. Hold for blood pressure less than 120, or heart rate less than 60  Dispense: 180 capsule; Refill: 1  4. Primary hypertension  - losartan (COZAAR) 100 MG tablet; Take 1 tablet (100 mg total) by mouth daily.  Dispense: 90 tablet; Refill: 1  5. Acute cough  - HYDROcodone bit-homatropine (HYCODAN) 5-1.5 MG/5ML syrup; Take 5 mLs by mouth every 8 (eight) hours as needed for cough.  Dispense: 120 mL; Refill: 0  6. Bacterial sinusitis  Has complete prescription of cefdinir, started last  night Continue to take all of the medication   Meds ordered this encounter  Medications   losartan (COZAAR) 100 MG tablet    Sig: Take 1 tablet (100 mg total) by mouth daily.    Dispense:  90 tablet    Refill:  1   diltiazem (CARDIZEM SR) 60 MG 12 hr capsule    Sig: Take 1 capsule (60 mg total) by mouth 2 (two) times daily. Hold for blood pressure less than 120, or heart rate less than 60    Dispense:  180 capsule    Refill:  1   cyanocobalamin (VITAMIN B12) injection 1,000 mcg   HYDROcodone bit-homatropine (HYCODAN) 5-1.5 MG/5ML syrup    Sig: Take 5 mLs by mouth every 8 (eight) hours as needed for cough.    Dispense:  120 mL    Refill:  0    Return if symptoms worsen or fail to improve.  Moshe Cipro, FNP

## 2023-07-06 NOTE — Patient Instructions (Signed)
 Refilled losartan and diltiazem  I have sent in hydrocodone cough syrup for you to take 5 mL once daily in the evening as needed for cough.  This medication may make you sleepy.  Do not drive or operate heavy machinery while taking this medication.  Follow-up with me for new or worsening symptoms.

## 2023-07-09 ENCOUNTER — Other Ambulatory Visit

## 2023-07-09 ENCOUNTER — Telehealth: Payer: Self-pay | Admitting: Gastroenterology

## 2023-07-09 NOTE — Telephone Encounter (Signed)
 I have spoken to the pt over the phone and let her know that she does not need labs at this time. She should have a followup in the next month and Dr Myrtie Neither will discuss at that time. The pt has been advised of the information and verbalized understanding.   (See last lab results note)

## 2023-07-09 NOTE — Telephone Encounter (Signed)
 Inbound call from patient stating she came in for lab work today but was advised there was not an order for her. Patient stated on mychart it stated she was due to have labs on 3/3 but it was no showed. Requesting a call back to discuss further. Please advise, thank you.

## 2023-07-10 ENCOUNTER — Other Ambulatory Visit (HOSPITAL_COMMUNITY): Payer: Self-pay

## 2023-07-10 ENCOUNTER — Telehealth: Payer: Self-pay | Admitting: Internal Medicine

## 2023-07-10 ENCOUNTER — Telehealth: Payer: Self-pay

## 2023-07-10 NOTE — Telephone Encounter (Signed)
 Theresa Harrington from [employer] called and stated that they're needing a prior auth for diltiazem for a tierin exemption. Please call and advise at [phone] option 2. They will be sending a fax.

## 2023-07-10 NOTE — Telephone Encounter (Signed)
 Pharmacy Patient Advocate Encounter   Received notification from Pt Calls Messages that tier exception for Diltiazem 60mg  is required/requested.   Insurance verification completed.   The patient is insured through [facility] [employer] .   Per test claim: PA required; PA submitted to above mentioned insurance via Phone Key/confirmation #/[id number] Status is pending

## 2023-07-10 NOTE — Telephone Encounter (Signed)
 Following up on pt application for [employer]), spoke with [employer] explain they are missing pt insurance information,was able to give to then over the phone and was able to have then APPROVE the application for 2025.pt was notify and will wait on Dr call once they received from [employer].

## 2023-07-10 NOTE — Telephone Encounter (Signed)
 Copied from CRM [id number]. Topic: Clinical - Medication Question >> Jul 10, 2023 10:04 AM Theresa Harrington wrote: Reason for CRM: Patient Would like to know if there is anything else Theresa Harrington or Theresa Harrington could prescribe something other than diltiazem (CARDIZEM SR) 60 MG 12 hr capsule due it to costing $250. Please call [phone]

## 2023-07-11 DIAGNOSIS — H401113 Primary open-angle glaucoma, right eye, severe stage: Secondary | ICD-10-CM | POA: Diagnosis not present

## 2023-07-11 DIAGNOSIS — D3131 Benign neoplasm of right choroid: Secondary | ICD-10-CM | POA: Diagnosis not present

## 2023-07-11 LAB — HM DIABETES EYE EXAM

## 2023-07-11 NOTE — Telephone Encounter (Signed)
 Patient has been made aware to contact her cardiologist office about this medication. She states that she will call the insurance company and her cardiologist office.

## 2023-07-11 NOTE — Telephone Encounter (Signed)
 Please advise. I see you started this medication for her.

## 2023-07-18 NOTE — Telephone Encounter (Signed)
 Pharmacy Patient Advocate Encounter  Received notification from [facility] [employer] that Prior Authorization/Tier Exception for Diltiazem 60mg  has been APPROVED from 07/11/23 to 04/30/24   Approval letter indexed to media tab

## 2023-07-19 NOTE — Telephone Encounter (Signed)
 Patient has been made aware.

## 2023-07-24 DIAGNOSIS — I739 Peripheral vascular disease, unspecified: Secondary | ICD-10-CM | POA: Diagnosis not present

## 2023-07-24 DIAGNOSIS — Z89422 Acquired absence of other left toe(s): Secondary | ICD-10-CM | POA: Diagnosis not present

## 2023-07-24 DIAGNOSIS — Z8631 Personal history of diabetic foot ulcer: Secondary | ICD-10-CM | POA: Diagnosis not present

## 2023-07-24 DIAGNOSIS — E1151 Type 2 diabetes mellitus with diabetic peripheral angiopathy without gangrene: Secondary | ICD-10-CM | POA: Diagnosis not present

## 2023-07-24 DIAGNOSIS — Z794 Long term (current) use of insulin: Secondary | ICD-10-CM | POA: Diagnosis not present

## 2023-07-24 DIAGNOSIS — Z9889 Other specified postprocedural states: Secondary | ICD-10-CM | POA: Diagnosis not present

## 2023-07-25 ENCOUNTER — Telehealth: Payer: Self-pay | Admitting: Internal Medicine

## 2023-07-25 ENCOUNTER — Other Ambulatory Visit: Payer: Self-pay

## 2023-07-25 NOTE — Telephone Encounter (Unsigned)
 Copied from CRM [id number]. Topic: Clinical - Medication Question >> Jul 25, 2023 11:43 AM Martinique E wrote: Reason for CRM: Patient called in stating that she usually picks up her Ozempic medication from the clinic, and questioning if this medication is ready for pick-up. Patient states that she has one pen left. Callback number for patient is [phone] to verify.

## 2023-07-25 NOTE — Progress Notes (Signed)
 This patient is appearing on a report for being at risk of failing the adherence measure for diabetes and hypertension (ACEi/ARB) medications this calendar year.   Medication: Losartan 100mg  daily  Last fill date: 07/13/2023 for 90 day supply  Obtains Ozempic through [facility] - patient reports to having 1 pen remaining.   Reviewed medication indication, dosing, and goals of therapy.   Will collaborate with embedded pharmacist to submit [facility] refill request for ozempic when appropriate.   Verdene Rio, PharmD PGY1 Pharmacy Resident

## 2023-07-25 NOTE — Telephone Encounter (Signed)
 Patient has been made aware that we haven't received her medication yet. She gave a verbal understanding.

## 2023-07-27 ENCOUNTER — Other Ambulatory Visit: Payer: Self-pay | Admitting: Internal Medicine

## 2023-07-27 DIAGNOSIS — K5904 Chronic idiopathic constipation: Secondary | ICD-10-CM

## 2023-07-27 DIAGNOSIS — I10 Essential (primary) hypertension: Secondary | ICD-10-CM

## 2023-07-27 DIAGNOSIS — E785 Hyperlipidemia, unspecified: Secondary | ICD-10-CM

## 2023-07-30 ENCOUNTER — Ambulatory Visit: Attending: Physician Assistant | Admitting: Physician Assistant

## 2023-07-30 ENCOUNTER — Encounter: Payer: Self-pay | Admitting: Physician Assistant

## 2023-07-30 VITALS — BP 136/88 | HR 58 | Ht 65.0 in | Wt 171.4 lb

## 2023-07-30 DIAGNOSIS — I10 Essential (primary) hypertension: Secondary | ICD-10-CM | POA: Diagnosis not present

## 2023-07-30 DIAGNOSIS — D649 Anemia, unspecified: Secondary | ICD-10-CM | POA: Diagnosis not present

## 2023-07-30 DIAGNOSIS — I480 Paroxysmal atrial fibrillation: Secondary | ICD-10-CM | POA: Diagnosis not present

## 2023-07-30 DIAGNOSIS — I342 Nonrheumatic mitral (valve) stenosis: Secondary | ICD-10-CM

## 2023-07-30 DIAGNOSIS — I7025 Atherosclerosis of native arteries of other extremities with ulceration: Secondary | ICD-10-CM

## 2023-07-30 DIAGNOSIS — D6869 Other thrombophilia: Secondary | ICD-10-CM

## 2023-07-30 DIAGNOSIS — I739 Peripheral vascular disease, unspecified: Secondary | ICD-10-CM

## 2023-07-30 NOTE — Progress Notes (Unsigned)
 Cardiology Office Note:  .   Date:  07/31/2023  ID:  Theresa Harrington, DOB 07/12/1955, MRN [id number] PCP: Theresa Grandchild, MD  [facility] Providers Cardiologist:  Theresa Schultz, MD Electrophysiologist:  Theresa Jorja Loa, MD     History of Present Illness: Theresa Harrington Kitchen   Theresa Harrington is a 68 y.o. 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  Echocardiogram obtained in June 2022 showed EF 65 to 70%, no regional wall motion abnormality, grade 2 DD, severe mitral annular calcification, no evidence of MR, mild to moderate mitral stenosis present.  Patient underwent atrial flutter ablation in the setting of COVID and personal stress in April 2023.  She remains on Xarelto.  Due to increased palpitation, diltiazem was increased in October 2024.  She underwent right fem PT bypass and right CFA endarterectomy by Dr. Myra Harrington on 03/09/2023.  She was started on clopidogrel 75 mg daily on top of Xarelto.  She was last seen by Theresa Reining, NP on 03/27/2023 at which time she was doing well.  Since the last visit, patient was admitted in early December 2024 with symptomatic anemia.  She had intermittent dark stool for a few days.  At the time of admission, she was on combination of aspirin, Plavix and Xarelto, all of which were held during the admission.  She required blood transfusion.  EGD performed on 04/09/2023 was unremarkable.  Capsule endoscopy was also unrevealing.  Her bleeding was likely in the setting of distal small bowel AVM.  Xarelto was resumed on discharge and so is high-dose aspirin 325 mg daily.  She is to see her GI doctor as outpatient who can decide whether or not to restart his Plavix at a later time and whether to decrease her full dose aspirin to low 81 mg aspirin.  During the hospitalization, she did have paroxysmal A-fib that was felt to be due to stress from the anemia.  She was  treated with IV Cardizem and IV digoxin.  Cardizem CD was decreased in the setting of slow heart rate in the soft blood pressure.  She was seen by her vascular surgery service in January 2025 who is okay to discontinue Plavix.  She was instructed to continue Xarelto and aspirin daily.  Patient presents today for follow-up.  She denies any chest pain or shortness of breath.  She is no longer taking the diltiazem, heart rate is very well-controlled in the high 50s.  She is still on the nebivolol for rate control.  Blood pressure improved after losartan was increased to 100 mg daily.  She is also on hydralazine 50 mg twice a day dosing.  She was previously started on indapamide, this has been stopped by her PCP according to the patient.  Blood pressure is very well-controlled on the current therapy.  Last hemoglobin was still low in January, I asked her to repeat a CBC today.  She does not have any heart failure symptoms, however her heart murmur is 3 out of 6 at the apex.  Theresa repeat echocardiogram to follow-up on the severity of mitral stenosis.  If echocardiogram and a CBC are normal, she can follow-up with Dr. Anne Harrington in about 4 to 6 months.  ROS:   She denies chest pain, palpitations, dyspnea, pnd, orthopnea, n, v, dizziness, syncope, edema, weight gain, or early satiety. All other systems reviewed and are otherwise negative except as noted  above.    Studies Reviewed: Theresa Harrington Kitchen   EKG Interpretation Date/Time:  Monday July 30 2023 09:07:09 EDT Ventricular Rate:  58 PR Interval:  158 QRS Duration:  78 QT Interval:  466 QTC Calculation: 457 R Axis:   12  Text Interpretation: Sinus bradycardia  No significant ST-T wave changes Confirmed by Theresa Harrington [id number]) on 07/31/2023 9:48:55 PM    Cardiac Studies & Procedures   ______________________________________________________________________________________________     ECHOCARDIOGRAM  ECHOCARDIOGRAM COMPLETE 10/07/2020  Narrative ECHOCARDIOGRAM  REPORT    Patient Name:   Theresa Harrington    Date of Exam: 10/07/2020 Medical Rec #:  [id number]     Height:       64.0 in Accession #:    [id number]    Weight:       171.0 lb Date of Birth:  Aug 29, 1955    BSA:          1.830 m Patient Age:    64 years      BP:           111/62 mmHg Patient Gender: F             HR:           60 bpm. Exam Location:  [address]  Procedure: 2D Echo, Cardiac Doppler and Color Doppler  Indications:    I34.0 Mitral Valve Insufficiency  History:        Patient has prior history of Echocardiogram examinations, most recent 09/25/2019. PAD, Arrythmias:Atrial Fibrillation; Risk Factors:Hypertension, Dyslipidemia, Diabetes, Family History of Coronary Artery Disease and Former Smoker. Bilateral Breast Augmentation.  Sonographer:    Theresa Harrington RDCS Referring Phys: [id number] Theresa Harrington  IMPRESSIONS   1. Left ventricular ejection fraction, by estimation, is 65 to 70%. The left ventricle has normal function. The left ventricle has no regional wall motion abnormalities. There is mild left ventricular hypertrophy. Left ventricular diastolic parameters are consistent with Grade II diastolic dysfunction (pseudonormalization). Elevated left atrial pressure. 2. Right ventricular systolic function is normal. The right ventricular size is normal. Tricuspid regurgitation signal is inadequate for assessing PA pressure. 3. Left atrial size was severely dilated. 4. Right atrial size was mildly dilated. 5. The mitral valve is abnormal. Severe mitral annular calcification. No evidence of mitral valve regurgitation. Mild to moderate mitral stenosis. MG , MVA 1.4 cm^2 by continuity equation 6. The aortic valve is tricuspid. Aortic valve regurgitation is not visualized. Mild to moderate aortic valve sclerosis/calcification is present, without any evidence of aortic stenosis. 7. The inferior vena cava is normal in size with greater than 50% respiratory variability, suggesting  right atrial pressure of 3 mmHg.  FINDINGS Left Ventricle: Left ventricular ejection fraction, by estimation, is 65 to 70%. The left ventricle has normal function. The left ventricle has no regional wall motion abnormalities. The left ventricular internal cavity size was normal in size. There is mild left ventricular hypertrophy. Left ventricular diastolic parameters are consistent with Grade II diastolic dysfunction (pseudonormalization). Elevated left atrial pressure.  Right Ventricle: The right ventricular size is normal. No increase in right ventricular wall thickness. Right ventricular systolic function is normal. Tricuspid regurgitation signal is inadequate for assessing PA pressure.  Left Atrium: Left atrial size was severely dilated.  Right Atrium: Right atrial size was mildly dilated.  Pericardium: There is no evidence of pericardial effusion.  Mitral Valve: The mitral valve is abnormal. Severe mitral annular calcification. No evidence of mitral valve regurgitation. Mild to moderate mitral valve stenosis. MV peak gradient, 12.2  mmHg. The mean mitral valve gradient is 4.0 mmHg.  Tricuspid Valve: The tricuspid valve is normal in structure. Tricuspid valve regurgitation is trivial.  Aortic Valve: The aortic valve is tricuspid. Aortic valve regurgitation is not visualized. Mild to moderate aortic valve sclerosis/calcification is present, without any evidence of aortic stenosis.  Pulmonic Valve: The pulmonic valve was grossly normal. Pulmonic valve regurgitation is trivial.  Aorta: The aortic root and ascending aorta are structurally normal, with no evidence of dilitation.  Venous: The inferior vena cava is normal in size with greater than 50% respiratory variability, suggesting right atrial pressure of 3 mmHg.  IAS/Shunts: The interatrial septum was not well visualized.   LEFT VENTRICLE PLAX 2D LVIDd:         4.40 cm  Diastology LVIDs:         2.30 cm  LV e' medial:    6.31  cm/s LV PW:         1.00 cm  LV E/e' medial:  22.8 LV IVS:        0.70 cm  LV e' lateral:   7.51 cm/s LVOT diam:     2.10 cm  LV E/e' lateral: 19.2 LV SV:         89 LV SV Index:   48 LVOT Area:     3.46 cm   RIGHT VENTRICLE RV S prime:     15.10 cm/s TAPSE (M-mode): 2.8 cm  LEFT ATRIUM              Index       RIGHT ATRIUM           Index LA diam:        4.90 cm  2.68 cm/m  RA Area:     19.40 cm LA Vol (A2C):   103.0 ml 56.27 ml/m RA Volume:   55.90 ml  30.54 ml/m LA Vol (A4C):   109.0 ml 59.55 ml/m LA Biplane Vol: 105.0 ml 57.37 ml/m AORTIC VALVE LVOT Vmax:   95.80 cm/s LVOT Vmean:  60.300 cm/s LVOT VTI:    0.256 m  AORTA Ao Root diam: 3.30 cm Ao Asc diam:  3.50 cm  MITRAL VALVE MV Area (PHT): cm          SHUNTS MV Area VTI:   1.40 cm     Systemic VTI:  0.26 m MV Peak grad:  12.2 mmHg    Systemic Diam: 2.10 cm MV Mean grad:  4.0 mmHg MV Vmax:       1.75 m/s MV Vmean:      87.8 cm/s MV Decel Time: 384 msec MV E velocity: 144.00 cm/s MV A velocity: 107.00 cm/s MV E/A ratio:  1.35  Epifanio Lesches MD Electronically signed by Epifanio Lesches MD Signature Date/Time: 10/07/2020/5:12:33 PM    Final   TEE  ECHO TEE 12/24/2019  Narrative TRANSESOPHOGEAL ECHO REPORT    Patient Name:   ARALI SOMERA Date of Exam: 12/24/2019 Medical Rec #:  [id number]  Height:       65.0 in Accession #:    [id number] Weight:       167.0 lb Date of Birth:  [birth year] BSA:          1.832 m Patient Age:    63 years   BP:           165/65 mmHg Patient Gender: F          HR:           57 bpm.  Exam Location:  Inpatient  Procedure: 3D Echo, Transesophageal Echo, Cardiac Doppler and Color Doppler  Indications:     I34.2 Nonrheumatic mitral (valve) stenosis  History:         Patient has prior history of Echocardiogram examinations, most recent 09/24/2019. Abnormal ECG, Mitral Valve Disease, Arrythmias:Atrial Fibrillation; Risk Factors:Diabetes and Dyslipidemia. Mitral  stenosis.  Sonographer:     Sheralyn Boatman RDCS Referring Phys:  [id number] TRACI R TURNER Diagnosing Phys: Armanda Magic MD  PROCEDURE: After discussion of the risks and benefits of a TEE, an informed consent was obtained from the patient. The transesophogeal probe was passed without difficulty through the esophogus of the patient. Imaged were obtained with the patient in a left lateral decubitus position. Local oropharyngeal anesthetic was provided with Cetacaine. Sedation performed by different physician. The patient was monitored while under deep sedation. Anesthestetic sedation was provided intravenously by Anesthesiology: 115mg  of Propofol, 60mg  of Lidocaine. The patient's vital signs; including heart rate, blood pressure, and oxygen saturation; remained stable throughout the procedure. The patient developed no complications during the procedure.  IMPRESSIONS   1. Left ventricular ejection fraction, by estimation, is 60 to 65%. The left ventricle has normal function. The left ventricle has no regional wall motion abnormalities. 2. Right ventricular systolic function is normal. The right ventricular size is mildly enlarged. 3. Left atrial size was moderately dilated. No left atrial/left atrial appendage thrombus was detected. 4. 3D imaging of mitral valve was performed. The mitral valve is degenerative in appearance. There is severe thickening of the posterior mitral valve leaflet(s). There is severe calcification of the posterior mitral valve leaflet(s). Fixed posterior leaflet of the mitral valve leaflets. Severe mitral annular calcification. No evidence of mitral valve regurgitation. Very Mild mitral stenosis. The mean mitral valve gradient is 2.5 mmHg. 5. The aortic valve is normal in structure. Aortic valve regurgitation is trivial. Mild aortic valve sclerosis is present, with no evidence of aortic valve stenosis. 6. The inferior vena cava is normal in size with greater than 50% respiratory  variability, suggesting right atrial pressure of 3 mmHg.  Conclusion(s)/Recommendation(s): Normal biventricular function without evidence of hemodynamically significant valvular heart disease.  FINDINGS Left Ventricle: Left ventricular ejection fraction, by estimation, is 60 to 65%. The left ventricle has normal function. The left ventricle has no regional wall motion abnormalities. The left ventricular internal cavity size was normal in size. There is no left ventricular hypertrophy.  Right Ventricle: The right ventricular size is mildly enlarged. No increase in right ventricular wall thickness. Right ventricular systolic function is normal.  Left Atrium: Left atrial size was moderately dilated. Spontaneous echo contrast was present. No left atrial/left atrial appendage thrombus was detected.  Right Atrium: Right atrial size was normal in size.  Pericardium: There is no evidence of pericardial effusion.  Mitral Valve: 3D imaging of mitral valve was performed. The mitral valve is degenerative in appearance. There is severe thickening of the posterior mitral valve leaflet(s). There is severe calcification of the posterior mitral valve leaflet(s). Fixed posterior leaflet of the mitral valve leaflets. Severe mitral annular calcification. No evidence of mitral valve regurgitation. Mild mitral valve stenosis. MV peak gradient, 7.8 mmHg. The mean mitral valve gradient is 2.5 mmHg.  Tricuspid Valve: The tricuspid valve is normal in structure. Tricuspid valve regurgitation is trivial. No evidence of tricuspid stenosis.  Aortic Valve: The aortic valve is normal in structure.. There is mild thickening of the aortic valve. Aortic valve regurgitation is trivial. Mild aortic valve sclerosis is present,  with no evidence of aortic valve stenosis. There is mild thickening of the aortic valve.  Pulmonic Valve: The pulmonic valve was normal in structure. Pulmonic valve regurgitation is trivial. No evidence of  pulmonic stenosis.  Aorta: The aortic root is normal in size and structure.  Venous: The inferior vena cava is normal in size with greater than 50% respiratory variability, suggesting right atrial pressure of 3 mmHg.  IAS/Shunts: The interatrial septum appears to be lipomatous. No atrial level shunt detected by color flow Doppler.   MITRAL VALVE MV Area (PHT): 1.91 cm MV Peak grad:  7.8 mmHg MV Mean grad:  2.5 mmHg MV Vmax:       1.40 m/s MV Vmean:      71.0 cm/s MV Decel Time: 397 msec MV E velocity: 136.00 cm/s MV A velocity: 87.10 cm/s MV E/A ratio:  1.56  Armanda Magic MD Electronically signed by Armanda Magic MD Signature Date/Time: 12/26/2019/1:12:11 PM    Final        ______________________________________________________________________________________________      Risk Assessment/Calculations:    CHA2DS2-VASc Score = 4   This indicates a 4.8% annual risk of stroke. The patient's score is based upon: CHF History: 0 HTN History: 1 Diabetes History: 0 Stroke History: 0 Vascular Disease History: 1 Age Score: 1 Gender Score: 1            Physical Exam:   VS:  BP 136/88   Pulse (!) 58   Ht 5\' 5"  (1.651 m)   Wt 171 lb 6.4 oz (77.7 kg)   LMP  (LMP Unknown)   SpO2 96%   BMI 28.52 kg/m    Wt Readings from Last 3 Encounters:  07/30/23 171 lb 6.4 oz (77.7 kg)  07/06/23 171 lb 12.8 oz (77.9 kg)  06/07/23 172 lb 3.2 oz (78.1 kg)    GEN: Well nourished, well developed in no acute distress NECK: No JVD; No carotid bruits CARDIAC: RRR, no murmurs, rubs, gallops RESPIRATORY:  Clear to auscultation without rales, wheezing or rhonchi  ABDOMEN: Soft, non-tender, non-distended EXTREMITIES:  No edema; No deformity   ASSESSMENT AND PLAN: .    Mitral Stenosis Severe mitral annular calcification without mitral regurgitation noted on echocardiogram in June 2022, with moderate mitral stenosis. Heart murmur is 3/6. She is overdue for echocardiogram. Current  symptoms do not suggest progression to severe stenosis.  - Order echocardiogram to assess current severity of mitral stenosis.  Peripheral Artery Disease (PAD) Fem-pop bypass and right fem PT bypass with right CFA endarterectomy. Currently on  aspirin for vascular health. Plavix was discontinued after previous GI bleed.  - Continue aspirin 325 mg daily.  Atrial Fibrillation Paroxysmal atrial fibrillation occurred during hospitalization due to stress from anemia. Currently in normal sinus rhythm. Nebivolol is providing rate control. No recent episodes of chest pain or dyspnea. - Continue nebivolol 10 mg daily for rate control.  Anemia Admitted in December 2024 with symptomatic anemia and intermittent melena, likely due to distal small bowel AVM exacerbated by aspirin, Plavix, and Xarelto. Required blood transfusion. EGD and capsule endoscopy were unremarkable. Plavix was discontinued, and Xarelto and high-dose aspirin were resumed on discharge. Hemoglobin was 9.8 in January 2025, previously as low as 6.4. Current regimen includes Xarelto and aspirin, with Plavix discontinued after vascular surgery consultation. - Repeat CBC to assess current hemoglobin levels.  Hypertension Blood pressure is well-controlled on losartan 100 mg daily, hydralazine 50 mg twice daily, and nebivolol 10 mg daily. Diltiazem was discontinued due to cost  and bradycardia. Current heart rate is 58 bpm, which is acceptable. Nebivolol provides rate control and prevents atrial fibrillation recurrence. - Continue losartan 100 mg daily. - Continue hydralazine 50 mg twice daily. - Continue nebivolol 10 mg daily.       Dispo: Follow-up with Dr. Anne Harrington in 4 to 6 months  Signed, Theresa Harrington, [place]

## 2023-07-30 NOTE — Patient Instructions (Signed)
 Medication Instructions:  NO CHANGES *If you need a refill on your cardiac medications before your next appointment, please call your pharmacy*  Lab Work: CBC TODAY If you have labs (blood work) drawn today and your tests are completely normal, you will receive your results only by: MyChart Message (if you have MyChart) OR A paper copy in the mail If you have any lab test that is abnormal or we need to change your treatment, we will call you to review the results.  Testing/Procedures:[address] Your physician has requested that you have an echocardiogram. Echocardiography is a painless test that uses sound waves to create images of your heart. It provides your doctor with information about the size and shape of your heart and how well your heart's chambers and valves are working. This procedure takes approximately one hour. There are no restrictions for this procedure. Please do NOT wear cologne, perfume, aftershave, or lotions (deodorant is allowed). Please arrive 15 minutes prior to your appointment time.  Please note: We ask at that you not bring children with you during ultrasound (echo/ vascular) testing. Due to room size and safety concerns, children are not allowed in the ultrasound rooms during exams. Our front office staff cannot provide observation of children in our lobby area while testing is being conducted. An adult accompanying a patient to their appointment will only be allowed in the ultrasound room at the discretion of the ultrasound technician under special circumstances. We apologize for any inconvenience.   Follow-Up: At [facility], you and your health needs are our priority.  As part of our continuing mission to provide you with exceptional heart care, our providers are all part of one team.  This team includes your primary Cardiologist (physician) and Advanced Practice Providers or APPs (Physician Assistants and Nurse Practitioners) who all work  together to provide you with the care you need, when you need it.  Your next appointment:   4-6 month(s)  Provider:   Donato Schultz, MD   Other Instructions   1st Floor: - Lobby - Registration  - Pharmacy  - Lab - Cafe  2nd Floor: - PV Lab - Diagnostic Testing (echo, CT, nuclear med)  3rd Floor: - Vacant  4th Floor: - TCTS (cardiothoracic surgery) - AFib Clinic - Structural Heart Clinic - Vascular Surgery  - Vascular Ultrasound  5th Floor: - [facility] (general and EP) - Clinical Pharmacy for coumadin, hypertension, lipid, weight-loss medications, and med management appointments    Valet parking services will be available as well.

## 2023-07-31 ENCOUNTER — Telehealth: Payer: Self-pay | Admitting: Internal Medicine

## 2023-07-31 LAB — CBC
Hematocrit: 36.3 % (ref 34.0–46.6)
Hemoglobin: 10.8 g/dL — ABNORMAL LOW (ref 11.1–15.9)
MCH: 23 pg — ABNORMAL LOW (ref 26.6–33.0)
MCHC: 29.8 g/dL — ABNORMAL LOW (ref 31.5–35.7)
MCV: 77 fL — ABNORMAL LOW (ref 79–97)
Platelets: 315 10*3/uL (ref 150–450)
RBC: 4.69 x10E6/uL (ref 3.77–5.28)
RDW: 17.7 % — ABNORMAL HIGH (ref 11.7–15.4)
WBC: 8 10*3/uL (ref 3.4–10.8)

## 2023-07-31 NOTE — Telephone Encounter (Signed)
 Copied from CRM [id number]. Topic: Clinical - Request for Lab/Test Order >> Jul 30, 2023  4:42 PM Sim Boast F wrote: Reason for CRM: Patient request order and call back for bone density scan

## 2023-08-07 ENCOUNTER — Telehealth: Payer: Self-pay

## 2023-08-07 ENCOUNTER — Ambulatory Visit: Payer: PPO | Admitting: Adult Health

## 2023-08-07 NOTE — Telephone Encounter (Signed)
 Patient has been made aware that her Ozempic medication is here and in our fridge. She gave a verbal understanding and states that she will pick her medication up tomorrow.

## 2023-08-09 NOTE — Telephone Encounter (Signed)
Can you place the order.

## 2023-08-10 ENCOUNTER — Other Ambulatory Visit: Payer: Self-pay | Admitting: Internal Medicine

## 2023-08-10 DIAGNOSIS — E2839 Other primary ovarian failure: Secondary | ICD-10-CM

## 2023-08-15 ENCOUNTER — Other Ambulatory Visit (HOSPITAL_BASED_OUTPATIENT_CLINIC_OR_DEPARTMENT_OTHER)

## 2023-08-21 DIAGNOSIS — E1151 Type 2 diabetes mellitus with diabetic peripheral angiopathy without gangrene: Secondary | ICD-10-CM | POA: Diagnosis not present

## 2023-08-21 DIAGNOSIS — Z794 Long term (current) use of insulin: Secondary | ICD-10-CM | POA: Diagnosis not present

## 2023-08-21 DIAGNOSIS — S90821A Blister (nonthermal), right foot, initial encounter: Secondary | ICD-10-CM | POA: Diagnosis not present

## 2023-08-25 ENCOUNTER — Other Ambulatory Visit: Payer: Self-pay | Admitting: Internal Medicine

## 2023-08-25 DIAGNOSIS — I4891 Unspecified atrial fibrillation: Secondary | ICD-10-CM

## 2023-08-25 DIAGNOSIS — I10 Essential (primary) hypertension: Secondary | ICD-10-CM

## 2023-08-29 ENCOUNTER — Ambulatory Visit (HOSPITAL_COMMUNITY)

## 2023-08-30 ENCOUNTER — Other Ambulatory Visit: Payer: Self-pay | Admitting: Internal Medicine

## 2023-08-30 DIAGNOSIS — E119 Type 2 diabetes mellitus without complications: Secondary | ICD-10-CM

## 2023-09-04 ENCOUNTER — Other Ambulatory Visit (HOSPITAL_BASED_OUTPATIENT_CLINIC_OR_DEPARTMENT_OTHER)

## 2023-09-04 DIAGNOSIS — S90821D Blister (nonthermal), right foot, subsequent encounter: Secondary | ICD-10-CM | POA: Diagnosis not present

## 2023-09-04 DIAGNOSIS — E1151 Type 2 diabetes mellitus with diabetic peripheral angiopathy without gangrene: Secondary | ICD-10-CM | POA: Diagnosis not present

## 2023-09-04 DIAGNOSIS — Z89422 Acquired absence of other left toe(s): Secondary | ICD-10-CM | POA: Diagnosis not present

## 2023-09-04 DIAGNOSIS — Z794 Long term (current) use of insulin: Secondary | ICD-10-CM | POA: Diagnosis not present

## 2023-09-10 ENCOUNTER — Other Ambulatory Visit: Payer: Self-pay | Admitting: Cardiology

## 2023-09-11 ENCOUNTER — Other Ambulatory Visit: Payer: Self-pay | Admitting: Internal Medicine

## 2023-09-11 DIAGNOSIS — E1151 Type 2 diabetes mellitus with diabetic peripheral angiopathy without gangrene: Secondary | ICD-10-CM

## 2023-09-14 ENCOUNTER — Other Ambulatory Visit: Payer: Self-pay | Admitting: Internal Medicine

## 2023-09-14 DIAGNOSIS — E119 Type 2 diabetes mellitus without complications: Secondary | ICD-10-CM

## 2023-09-18 ENCOUNTER — Ambulatory Visit (INDEPENDENT_AMBULATORY_CARE_PROVIDER_SITE_OTHER)
Admission: RE | Admit: 2023-09-18 | Discharge: 2023-09-18 | Disposition: A | Discharge status: Home / Self Care | Source: Ambulatory Visit | Attending: Internal Medicine | Admitting: Internal Medicine

## 2023-09-18 DIAGNOSIS — E2839 Other primary ovarian failure: Secondary | ICD-10-CM | POA: Diagnosis not present

## 2023-09-19 ENCOUNTER — Telehealth: Payer: Self-pay

## 2023-09-19 NOTE — Telephone Encounter (Signed)
 This patient is appearing on a report for being at risk of failing the adherence measure for diabetes and hypertension (ACEi/ARB) medications last calendar year.   Medication: losartan  100 mg daily Last fill date: 07/13/23 for 90 day supply  Medication: Synjardy  5-500 mg twice daily Approved for patient assistance with [employer] until 04/30/24  Medication: Ozempic  1 mg weekly Approved for patient assistance with Novo until 04/30/24  Confirmed patient has Ozempic  supply at home - 3 boxes remaining (90d supply). She notes she is expecting Synjardy  to be shipped soon. No further action needed.   Abelina Abide, PharmD PGY1 Pharmacy Resident 09/19/2023 10:54 AM

## 2023-09-20 ENCOUNTER — Ambulatory Visit: Payer: Self-pay

## 2023-09-20 NOTE — Telephone Encounter (Signed)
  Chief Complaint: High Blood pressure readings Symptoms: 178/60, 181/83-slight headache but no other symptoms Frequency: over a week Pertinent Negatives: Patient denies CP, SOB, weakness Disposition: [] ED /[] Urgent Care (no appt availability in office) / [x] Appointment(In office/virtual)/ []  [facility]/ [] Home Care/ [] Refused Recommended Disposition /[] [facility]/ []  Follow-up with PCP Additional Notes: patient calling with concerns for high BP readings on blood pressure medications. Patient reports 178/60 and 181/83 for the last two days. Patient endorses slight headache. Patient reports she has had similar bp readings for the last week. Per protocol, patient is recommended to be seen in the next three days. Appointment scheduled for tomorrow, 09/21/2023 at 8:20 AM with another provider in office. Patient is scheduled to be seen by PCP on 10/09/2023-patient is wanting to get any potential blood work done tomorrow morning as her appointment with PCP is in the afternoon. Patient is asking for a phone call back from office. Patient verbalized understanding and all questions answered.    Copied from CRM [id number]. Topic: Clinical - Red Word Triage >> Sep 20, 2023  9:51 AM Deaijah H wrote: Red Word that prompted transfer to Nurse Triage: BP running high for past week. 178/60's (this morning) yesterday 181/82 Reason for Disposition  Systolic BP  >= 160 OR Diastolic >= 100  Answer Assessment - Initial Assessment Questions 1. BLOOD PRESSURE: "What is the blood pressure?" "Did you take at least two measurements 5 minutes apart?"     178/60, 181/82-yesterday 2. ONSET: "When did you take your blood pressure?"     Takes it twice a day 3. HOW: "How did you take your blood pressure?" (e.g., automatic home BP monitor, visiting nurse)     Automatic home BP monitor 4. HISTORY: "Do you have a history of high blood pressure?"     yes 5. MEDICINES: "Are you taking any medicines for blood  pressure?" "Have you missed any doses recently?"     Hydralazine , losartan  6. OTHER SYMPTOMS: "Do you have any symptoms?" (e.g., blurred vision, chest pain, difficulty breathing, headache, weakness)     Slight headache  Protocols used: Blood Pressure - High-A-AH

## 2023-09-21 ENCOUNTER — Ambulatory Visit (INDEPENDENT_AMBULATORY_CARE_PROVIDER_SITE_OTHER): Admitting: Nurse Practitioner

## 2023-09-21 VITALS — BP 162/70 | HR 75 | Temp 98.1°F | Ht 65.0 in | Wt 176.4 lb

## 2023-09-21 DIAGNOSIS — G63 Polyneuropathy in diseases classified elsewhere: Secondary | ICD-10-CM | POA: Diagnosis not present

## 2023-09-21 DIAGNOSIS — E1151 Type 2 diabetes mellitus with diabetic peripheral angiopathy without gangrene: Secondary | ICD-10-CM | POA: Diagnosis not present

## 2023-09-21 DIAGNOSIS — D509 Iron deficiency anemia, unspecified: Secondary | ICD-10-CM | POA: Diagnosis not present

## 2023-09-21 DIAGNOSIS — I10 Essential (primary) hypertension: Secondary | ICD-10-CM

## 2023-09-21 DIAGNOSIS — I7025 Atherosclerosis of native arteries of other extremities with ulceration: Secondary | ICD-10-CM

## 2023-09-21 DIAGNOSIS — E538 Deficiency of other specified B group vitamins: Secondary | ICD-10-CM

## 2023-09-21 MED ORDER — HYDRALAZINE HCL 100 MG PO TABS: 100.0000 mg | ORAL_TABLET | Freq: Two times a day (BID) | ORAL | Status: DC

## 2023-09-21 MED ORDER — TELMISARTAN 40 MG PO TABS: 40.0000 mg | ORAL_TABLET | Freq: Every day | ORAL | 1 refills | Status: DC

## 2023-09-21 NOTE — Assessment & Plan Note (Signed)
 Chronic Labs ordered in anticipation of upcoming PCP appointment.

## 2023-09-21 NOTE — Progress Notes (Signed)
 Established Patient Office Visit  Subjective   Patient ID: Theresa Harrington, female    DOB: 09-25-1955  Age: 68 y.o. MRN: [id number]  Chief Complaint  Patient presents with   Hypertension    Patient has today for acute visit for the above. She has been monitoring her blood pressure at home, has been seeing elevated readings of systolic blood pressure between 170-180.  Generally these are in the morning before she takes her medications, however she has reported some readings during the day that are elevated as well.  She is not experiencing any symptoms of high blood pressure such as blurry vision, shortness of breath, chest pain, headache.  Currently she is taking hydralazine  100 mg twice daily, losartan  100 mg daily, and Nebivolol  10 mg daily.    Review of Systems  Eyes:  Negative for blurred vision.  Respiratory:  Negative for shortness of breath.   Cardiovascular:  Negative for chest pain.  Neurological:  Negative for headaches.      Objective:     BP (!) 162/70   Pulse 75   Temp 98.1 F (36.7 C) (Temporal)   Ht 5\' 5"  (1.651 m)   Wt 176 lb 6 oz (80 kg)   LMP  (LMP Unknown)   SpO2 97%   BMI 29.35 kg/m  BP Readings from Last 3 Encounters:  09/21/23 (!) 162/70  07/30/23 136/88  07/06/23 136/60   Wt Readings from Last 3 Encounters:  09/21/23 176 lb 6 oz (80 kg)  07/30/23 171 lb 6.4 oz (77.7 kg)  07/06/23 171 lb 12.8 oz (77.9 kg)      Physical Exam Vitals reviewed.  Constitutional:      General: She is not in acute distress.    Appearance: Normal appearance.  HENT:     Head: Normocephalic and atraumatic.  Neck:     Vascular: No carotid bruit.  Cardiovascular:     Rate and Rhythm: Normal rate and regular rhythm.     Pulses: Normal pulses.     Heart sounds: Normal heart sounds.  Pulmonary:     Effort: Pulmonary effort is normal.     Breath sounds: Normal breath sounds.  Skin:    General: Skin is warm and dry.  Neurological:     General: No focal deficit  present.     Mental Status: She is alert and oriented to person, place, and time.  Psychiatric:        Mood and Affect: Mood normal.        Behavior: Behavior normal.        Judgment: Judgment normal.      No results found for any visits on 09/21/23.    The ASCVD Risk score (Arnett DK, et al., 2019) failed to calculate for the following reasons:   The valid total cholesterol range is 130 to 320 mg/dL    Assessment & Plan:   Problem List Items Addressed This Visit       Cardiovascular and Mediastinum   Essential hypertension, benign - Primary   Chronic Blood pressure above goal Continue hydralazine  100 mg twice daily, Nebivolol  10 mg daily.  Will switch from losartan  to telmisartan to see if this helps to cut down the labile blood pressures due to the longer half-life.  Take telmisartan 40 mg daily and discontinue losartan .  Patient to follow back up in 7 to 14 days for follow-up labs.  She has appointment with PCP on June 10, she was encouraged to keep this appointment as  scheduled.  She was encouraged to monitor blood pressures at home and reach out before June 10 if she sees systolic blood pressure greater than 140 consistently.  At that point I would recommend increasing telmisartan to 80 mg daily.      Relevant Medications   hydrALAZINE  (APRESOLINE ) 100 MG tablet   telmisartan (MICARDIS) 40 MG tablet   Other Relevant Orders   CBC   Comprehensive metabolic panel with GFR   Hemoglobin A1c   Lipid panel   Iron, TIBC and Ferritin Panel   Vitamin B12   Atherosclerosis of native arteries of the extremities with ulceration (HCC)   Chronic Labs ordered in anticipation of upcoming PCP appointment.      Relevant Medications   hydrALAZINE  (APRESOLINE ) 100 MG tablet   telmisartan (MICARDIS) 40 MG tablet   Other Relevant Orders   CBC   Comprehensive metabolic panel with GFR   Hemoglobin A1c   Lipid panel   Iron, TIBC and Ferritin Panel   Vitamin B12   DM (diabetes  mellitus) type II, controlled, with peripheral vascular disorder (HCC)   Chronic Labs ordered in anticipation of upcoming PCP appointment.      Relevant Medications   hydrALAZINE  (APRESOLINE ) 100 MG tablet   telmisartan (MICARDIS) 40 MG tablet   Other Relevant Orders   CBC   Comprehensive metabolic panel with GFR   Hemoglobin A1c   Lipid panel   Iron, TIBC and Ferritin Panel   Vitamin B12     Nervous and Auditory   Vitamin B12 deficiency neuropathy (HCC)   Chronic Labs ordered in anticipation of upcoming PCP appointment.      Relevant Orders   CBC   Comprehensive metabolic panel with GFR   Hemoglobin A1c   Lipid panel   Iron, TIBC and Ferritin Panel   Vitamin B12     Other   Anemia, iron deficiency   Relevant Orders   CBC   Comprehensive metabolic panel with GFR   Hemoglobin A1c   Lipid panel   Iron, TIBC and Ferritin Panel   Vitamin B12    Return for F/U as scheduled.    Zorita Hiss, NP

## 2023-09-21 NOTE — Patient Instructions (Signed)
 STOP losartan  START Telmisartan  Return to lab between 5/30-6/6 for repeat labs. Monitor BP at home and reach out if you see consistent numbers that are >140/>90. Follow-up with Dr. Rochelle Chu as scheduled on 6/10.

## 2023-09-21 NOTE — Assessment & Plan Note (Signed)
 Chronic Blood pressure above goal Continue hydralazine  100 mg twice daily, Nebivolol  10 mg daily.  Will switch from losartan  to telmisartan to see if this helps to cut down the labile blood pressures due to the longer half-life.  Take telmisartan 40 mg daily and discontinue losartan .  Patient to follow back up in 7 to 14 days for follow-up labs.  She has appointment with PCP on June 10, she was encouraged to keep this appointment as scheduled.  She was encouraged to monitor blood pressures at home and reach out before June 10 if she sees systolic blood pressure greater than 140 consistently.  At that point I would recommend increasing telmisartan to 80 mg daily.

## 2023-09-23 ENCOUNTER — Ambulatory Visit: Payer: Self-pay | Admitting: Internal Medicine

## 2023-09-26 ENCOUNTER — Ambulatory Visit (HOSPITAL_COMMUNITY)
Admission: RE | Admit: 2023-09-26 | Discharge: 2023-09-26 | Disposition: A | Discharge status: Home / Self Care | Source: Ambulatory Visit | Attending: Cardiovascular Disease | Admitting: Cardiovascular Disease

## 2023-09-26 DIAGNOSIS — I342 Nonrheumatic mitral (valve) stenosis: Secondary | ICD-10-CM

## 2023-09-26 LAB — ECHOCARDIOGRAM COMPLETE
Area-P 1/2: 3.48 cm2
MV VTI: 1.29 cm2
S' Lateral: 2.7 cm

## 2023-09-29 ENCOUNTER — Ambulatory Visit: Payer: Self-pay | Admitting: Physician Assistant

## 2023-09-30 ENCOUNTER — Other Ambulatory Visit: Payer: Self-pay | Admitting: Internal Medicine

## 2023-09-30 DIAGNOSIS — I4891 Unspecified atrial fibrillation: Secondary | ICD-10-CM

## 2023-10-08 ENCOUNTER — Other Ambulatory Visit (INDEPENDENT_AMBULATORY_CARE_PROVIDER_SITE_OTHER)

## 2023-10-08 DIAGNOSIS — I10 Essential (primary) hypertension: Secondary | ICD-10-CM

## 2023-10-08 DIAGNOSIS — E538 Deficiency of other specified B group vitamins: Secondary | ICD-10-CM | POA: Diagnosis not present

## 2023-10-08 DIAGNOSIS — D509 Iron deficiency anemia, unspecified: Secondary | ICD-10-CM

## 2023-10-08 DIAGNOSIS — E1151 Type 2 diabetes mellitus with diabetic peripheral angiopathy without gangrene: Secondary | ICD-10-CM | POA: Diagnosis not present

## 2023-10-08 DIAGNOSIS — G63 Polyneuropathy in diseases classified elsewhere: Secondary | ICD-10-CM | POA: Diagnosis not present

## 2023-10-08 DIAGNOSIS — I7025 Atherosclerosis of native arteries of other extremities with ulceration: Secondary | ICD-10-CM | POA: Diagnosis not present

## 2023-10-09 ENCOUNTER — Ambulatory Visit: Admitting: Internal Medicine

## 2023-10-09 LAB — IRON,TIBC AND FERRITIN PANEL
%SAT: 7 % — ABNORMAL LOW (ref 16–45)
Ferritin: 5 ng/mL — ABNORMAL LOW (ref 16–288)
Iron: 23 ug/dL — ABNORMAL LOW (ref 45–160)
TIBC: 333 ug/dL (ref 250–450)

## 2023-10-12 ENCOUNTER — Ambulatory Visit (INDEPENDENT_AMBULATORY_CARE_PROVIDER_SITE_OTHER): Admitting: Internal Medicine

## 2023-10-12 ENCOUNTER — Encounter: Payer: Self-pay | Admitting: Internal Medicine

## 2023-10-12 VITALS — BP 120/82 | HR 140 | Temp 97.5°F | Resp 16 | Ht 65.0 in | Wt 175.8 lb

## 2023-10-12 DIAGNOSIS — E538 Deficiency of other specified B group vitamins: Secondary | ICD-10-CM

## 2023-10-12 DIAGNOSIS — E119 Type 2 diabetes mellitus without complications: Secondary | ICD-10-CM | POA: Diagnosis not present

## 2023-10-12 DIAGNOSIS — I4892 Unspecified atrial flutter: Secondary | ICD-10-CM | POA: Diagnosis not present

## 2023-10-12 DIAGNOSIS — D509 Iron deficiency anemia, unspecified: Secondary | ICD-10-CM

## 2023-10-12 DIAGNOSIS — Z794 Long term (current) use of insulin: Secondary | ICD-10-CM

## 2023-10-12 DIAGNOSIS — G63 Polyneuropathy in diseases classified elsewhere: Secondary | ICD-10-CM | POA: Diagnosis not present

## 2023-10-12 DIAGNOSIS — Z1231 Encounter for screening mammogram for malignant neoplasm of breast: Secondary | ICD-10-CM

## 2023-10-12 DIAGNOSIS — R000 Tachycardia, unspecified: Secondary | ICD-10-CM | POA: Diagnosis not present

## 2023-10-12 LAB — URINALYSIS, ROUTINE W REFLEX MICROSCOPIC
Bilirubin Urine: NEGATIVE
Hgb urine dipstick: NEGATIVE
Leukocytes,Ua: NEGATIVE
Nitrite: NEGATIVE
Specific Gravity, Urine: 1.025 (ref 1.000–1.030)
Total Protein, Urine: NEGATIVE
Urine Glucose: >=1000 — AB
Urobilinogen, UA: 0.2 (ref 0.0–1.0)
pH: 6 (ref 5.0–8.0)

## 2023-10-12 LAB — MICROALBUMIN / CREATININE URINE RATIO
Creatinine,U: 80.5 mg/dL
Microalb Creat Ratio: 16.9 mg/g (ref 0.0–30.0)
Microalb, Ur: 1.4 mg/dL (ref 0.0–1.9)

## 2023-10-12 MED ORDER — DILTIAZEM HCL ER COATED BEADS 120 MG PO CP24: 120.0000 mg | ORAL_CAPSULE | Freq: Every day | ORAL | 0 refills | Status: DC

## 2023-10-12 MED ORDER — CYANOCOBALAMIN 1000 MCG/ML IJ SOLN
1000.0000 ug | Freq: Once | INTRAMUSCULAR | Status: AC
  Administered 2023-10-12: 1000 ug via INTRAMUSCULAR

## 2023-10-12 NOTE — Progress Notes (Unsigned)
 Subjective:  Patient ID: Theresa Harrington, female    DOB: November 13, 1955  Age: 68 y.o. MRN: [id number]  CC: Medical Management of Chronic Issues (3 month follow up. Vitamin b12 shot. Patient states that she's In Afib and concerned about her BP. )   HPI [name] presents for f/up ---  Discussed the use of AI scribe software for clinical note transcription with the patient, who gave verbal consent to proceed.  History of Present Illness   [name] is a 68 year old female with atrial fibrillation who presents with concerns about her heart rate and blood pressure.  She experiences fluctuating blood pressure with periods of both high and low readings, which she finds confusing. Her heart rate is erratic, described as going into 'craziness'.  She is currently taking diltiazem  but feels it is not effective and requests a refill. No dizziness, lightheadedness, chest pain, shortness of breath, or palpitations.       Outpatient Medications Prior to Visit  Medication Sig Dispense Refill  . aspirin  EC 81 MG tablet Take 1 tablet (81 mg total) by mouth daily. Swallow whole. (Patient taking differently: Take 325 mg by mouth daily. Swallow whole.)    . bisacodyl  (DULCOLAX) 5 MG EC tablet Take 5 mg by mouth daily as needed for moderate constipation.    . Blood Glucose Monitoring Suppl (ONETOUCH VERIO IQ SYSTEM) w/Device KIT USE TO CHECK SUGAR DAILY 1 kit 0  . cetirizine  (ZYRTEC ) 10 MG tablet Take 10 mg by mouth daily as needed for allergies.    . Docusate Sodium  (COLACE PO) Take 1 tablet by mouth daily as needed.    . dorzolamide -timolol  (COSOPT ) 22.3-6.8 MG/ML ophthalmic solution Place 1 drop into the right eye 2 (two) times daily.     . ezetimibe  (ZETIA ) 10 MG tablet Take 1 tablet by mouth once daily 90 tablet 0  . hydrALAZINE  (APRESOLINE ) 100 MG tablet Take 1 tablet (100 mg total) by mouth in the morning and at bedtime.    . Insulin  Glargine (BASAGLAR  KWIKPEN) 100 UNIT/ML DIAL  AND INJECT 46 UNITS UNDER  THE SKIN DAILY. NEEDS VISIT WITH DR. Rochelle Chu FOR REFILLS. 15 mL 0  . insulin  lispro (HUMALOG  KWIKPEN) 100 UNIT/ML KwikPen Inject 14 Units into the skin with breakfast, with lunch, and with evening meal. DIAL  14 UNITS AND INJECT UNDER THE SKIN THREE TIMES A DAY. 45 mL 0  . Insulin  Pen Needle (PEN NEEDLES) 32G X 4 MM MISC Inject 1 pen  as directed 4 (four) times daily. Use to inject levemir  or novolog  as directed. 400 each 3  . latanoprost  (XALATAN ) 0.005 % ophthalmic solution Place 1 drop into both eyes at bedtime.     . losartan  (COZAAR ) 25 MG tablet Take 1 tablet (25 mg total) by mouth daily. NEEDS APPOINTMENT WITH DR. Rochelle Chu FOR REFILLS 30 tablet 0  . MAGNESIUM -OXIDE 400 (240 Mg) MG tablet Take 1 tablet by mouth once daily 90 tablet 0  . nebivolol  (BYSTOLIC ) 10 MG tablet Take 1 tablet by mouth once daily 90 tablet 3  . ONETOUCH VERIO test strip USE TO check blood glucose TWICE DAILY 200 strip 3  . pantoprazole  (PROTONIX ) 40 MG tablet Take 1 tablet by mouth twice daily 180 tablet 0  . rosuvastatin  (CRESTOR ) 5 MG tablet Take 1 tablet by mouth once daily 90 tablet 1  . Semaglutide , 1 MG/DOSE, (OZEMPIC , 1 MG/DOSE,) 4 MG/3ML SOPN Inject 1 mg into the skin once a week. Via [employer] patient assistance 9 mL 1  .  SYNJARDY  5-500 MG TABS TAKE 1 TABLET BY MOUTH TWICE DAILY 180 tablet 1  . XARELTO  20 MG TABS tablet TAKE 1 TABLET BY MOUTH ONCE DAILY . APPOINTMENT REQUIRED FOR FUTURE REFILLS WITH  DR.  Rochelle Chu. 30 tablet 0  . Ferric Maltol  (ACCRUFER ) 30 MG CAPS Take 1 capsule (30 mg total) by mouth in the morning and at bedtime. 180 capsule 0  . HYDROcodone  bit-homatropine (HYCODAN) 5-1.5 MG/5ML syrup Take 5 mLs by mouth every 8 (eight) hours as needed for cough. 120 mL 0  . indapamide  (LOZOL ) 1.25 MG tablet Take 1 tablet (1.25 mg total) by mouth daily. 90 tablet 0  . telmisartan  (MICARDIS ) 40 MG tablet Take 1 tablet (40 mg total) by mouth daily. 90 tablet 1   No facility-administered medications prior to visit.     ROS Review of Systems  Objective:  BP 120/82 (BP Location: Left Arm, Patient Position: Sitting, Cuff Size: Normal)   Pulse (!) 140   Temp (!) 97.5 F (36.4 C) (Oral)   Resp 16   Ht 5' 5 (1.651 m)   Wt 175 lb 12.8 oz (79.7 kg)   LMP  (LMP Unknown)   SpO2 99%   BMI 29.25 kg/m   BP Readings from Last 3 Encounters:  10/12/23 120/82  09/21/23 (!) 162/70  07/30/23 136/88    Wt Readings from Last 3 Encounters:  10/12/23 175 lb 12.8 oz (79.7 kg)  09/21/23 176 lb 6 oz (80 kg)  07/30/23 171 lb 6.4 oz (77.7 kg)    Physical Exam  Cardiovascular:     Rate and Rhythm: Regular rhythm. Bradycardia present.     Heart sounds: Normal heart sounds, S1 normal and S2 normal. No murmur heard.    Comments: EKG---- A flutter 2:1 block, 140 bpm NS T wave changes No Q waves  Musculoskeletal:     Right lower leg: No edema.     Left lower leg: No edema.    Lab Results  Component Value Date   WBC 11.0 (H) 10/12/2023   HGB 11.1 (L) 10/12/2023   HCT 37.2 10/12/2023   PLT 374 10/12/2023   GLUCOSE 139 (H) 10/12/2023   CHOL 118 10/08/2023   TRIG 164.0 (H) 10/08/2023   HDL 46.60 10/08/2023   LDLDIRECT 160.2 03/01/2010   LDLCALC 38 10/08/2023   ALT 9 10/08/2023   AST 9 10/08/2023   NA 141 10/12/2023   K 4.4 10/12/2023   CL 106 10/12/2023   CREATININE 0.94 10/12/2023   BUN 20 10/12/2023   CO2 24 10/12/2023   TSH 0.70 10/12/2023   INR 1.0 03/09/2023   HGBA1C 8.9 (H) 10/12/2023   MICROALBUR 1.4 10/12/2023    ECHOCARDIOGRAM COMPLETE Result Date: 09/26/2023    ECHOCARDIOGRAM REPORT   Patient Name:   Theresa Harrington    Date of Exam: 09/26/2023 Medical Rec #:  [id number]     Height:       65.0 in Accession #:    [id number]    Weight:       176.4 lb Date of Birth:  [birth year]    BSA:          1.875 m Patient Age:    67 years      BP:           136/88 mmHg Patient Gender: F             HR:           62 bpm. Exam Location:  [facility] Procedure:  2D Echo, Cardiac Doppler, Color Doppler and  3D Echo (Both Spectral            and Color Flow Doppler were utilized during procedure). Indications:    Mitral Stenosis I34.2  History:        Patient has prior history of Echocardiogram examinations, most                 recent 10/07/2020. Atrial Fibrillation Ablation, Arrythmias:Atrial                 Fibrillation; Risk Factors:Hypertension, Dyslipidemia and                 Diabetes.  Sonographer:    Joleen Navy RDCS Referring Phys: [id number] HAO MENG IMPRESSIONS  1. Left ventricular ejection fraction, by estimation, is 60 to 65%. The left ventricle has normal function. Left ventricular endocardial border not optimally defined to evaluate regional wall motion. Left ventricular diastolic parameters are indeterminate.  2. Right ventricular systolic function is normal. The right ventricular size is mildly enlarged.  3. Left atrial size was severely dilated.  4. 2D Planimetry of 2.34 cm2. Continuity equation MVA 1.32 cm . The mitral valve is degenerative. No evidence of mitral valve regurgitation. Mild to moderate mitral stenosis. The mean mitral valve gradient is 5.0 mmHg with average heart rate of 61 bpm.  Severe mitral annular calcification.  5. The aortic valve is tricuspid. There is mild calcification of the aortic valve. Aortic valve regurgitation is not visualized. Aortic valve sclerosis is present, with no evidence of aortic valve stenosis.  6. Dilated pulmonary artery.  7. The inferior vena cava is normal in size with greater than 50% respiratory variability, suggesting right atrial pressure of 3 mmHg. Comparison(s): Prior images reviewed side by side. Interval right ventricular dilation. Stable mitral valve parameters. FINDINGS  Left Ventricle: No strain transmitted. Left ventricular ejection fraction, by estimation, is 60 to 65%. The left ventricle has normal function. Left ventricular endocardial border not optimally defined to evaluate regional wall motion. Strain was performed and the global  longitudinal strain is indeterminate. The left ventricular internal cavity size was normal in size. There is no left ventricular hypertrophy. Left ventricular diastolic parameters are indeterminate. Right Ventricle: The right ventricular size is mildly enlarged. No increase in right ventricular wall thickness. Right ventricular systolic function is normal. Left Atrium: Left atrial size was severely dilated. Right Atrium: Right atrial size was normal in size. Pericardium: There is no evidence of pericardial effusion. Mitral Valve: 2D Planimetry of 2.34 cm2. Continuity equation MVA 1.32 cm. The mitral valve is degenerative in appearance. Severe mitral annular calcification. No evidence of mitral valve regurgitation. Mild to moderate mitral valve stenosis. MV peak gradient, 14.7 mmHg. The mean mitral valve gradient is 5.0 mmHg with average heart rate of 61 bpm. Tricuspid Valve: The tricuspid valve is normal in structure. Tricuspid valve regurgitation is not demonstrated. No evidence of tricuspid stenosis. Aortic Valve: The aortic valve is tricuspid. There is mild calcification of the aortic valve. Aortic valve regurgitation is not visualized. Aortic valve sclerosis is present, with no evidence of aortic valve stenosis. Pulmonic Valve: The pulmonic valve was normal in structure. Pulmonic valve regurgitation is mild. No evidence of pulmonic stenosis. Aorta: The aortic root and ascending aorta are structurally normal, with no evidence of dilitation. Pulmonary Artery: The pulmonary artery is dilated. Venous: The inferior vena cava is normal in size with greater than 50% respiratory variability, suggesting right atrial pressure of 3 mmHg. IAS/Shunts:  There is right bowing of the interatrial septum, suggestive of elevated left atrial pressure. The atrial septum is grossly normal. Additional Comments: 3D was performed not requiring image post processing on an independent workstation and was normal.  LEFT VENTRICLE PLAX 2D  LVIDd:         4.40 cm   Diastology LVIDs:         2.70 cm   LV e' medial:    5.59 cm/s LV PW:         1.00 cm   LV E/e' medial:  36.0 LV IVS:        0.90 cm   LV e' lateral:   5.68 cm/s LVOT diam:     2.00 cm   LV E/e' lateral: 35.4 LV SV:         74 LV SV Index:   39 LVOT Area:     3.14 cm                           3D Volume EF:                          3D EF:        62 %                          LV EDV:       138 ml                          LV ESV:       53 ml                          LV SV:        86 ml RIGHT VENTRICLE RV Basal diam:  4.30 cm RV Mid diam:    3.40 cm RV S prime:     10.10 cm/s TAPSE (M-mode): 1.6 cm LEFT ATRIUM           Index        RIGHT ATRIUM           Index LA diam:      4.80 cm 2.56 cm/m   RA Area:     17.60 cm LA Vol (A2C): 63.9 ml 34.08 ml/m  RA Volume:   44.30 ml  23.62 ml/m LA Vol (A4C): 92.8 ml 49.49 ml/m  AORTIC VALVE LVOT Vmax:   90.20 cm/s LVOT Vmean:  63.200 cm/s LVOT VTI:    0.235 m  AORTA Ao Root diam: 3.20 cm Ao Asc diam:  3.90 cm MITRAL VALVE MV Area (PHT): 3.48 cm     SHUNTS MV Area VTI:   1.29 cm     Systemic VTI:  0.24 m MV Peak grad:  14.7 mmHg    Systemic Diam: 2.00 cm MV Mean grad:  5.0 mmHg MV Vmax:       1.92 m/s MV Vmean:      101.0 cm/s MV Decel Time: 218 msec MV E velocity: 201.00 cm/s MV A velocity: 65.40 cm/s MV E/A ratio:  3.07 Gloriann Larger MD Electronically signed by Gloriann Larger MD Signature Date/Time: 09/26/2023/4:51:53 PM    Final     Assessment & Plan:  Tachycardia -     EKG 12-Lead -     TSH; Future -     AMB  Referral VBCI Care Management  B12 deficiency -     Cyanocobalamin   Insulin -requiring or dependent type II diabetes mellitus (HCC) -     HM Diabetes Foot Exam -     Microalbumin / creatinine urine ratio; Future -     Urinalysis, Routine w reflex microscopic; Future -     Hemoglobin A1c; Future -     Basic metabolic panel with GFR; Future -     AMB Referral VBCI Care Management -     Ambulatory referral to  Ophthalmology  Vitamin B12 deficiency neuropathy (HCC) -     CBC with Differential/Platelet; Future -     Folate; Future  Iron deficiency anemia, unspecified iron deficiency anemia type -     CBC with Differential/Platelet; Future -     Iron, TIBC and Ferritin Panel; Future -     ACCRUFeR ; Take 1 capsule (30 mg total) by mouth in the morning and at bedtime.  Dispense: 180 capsule; Refill: 0 -     Hemoccult Cards (X3 cards); Future  Atrial flutter with rapid ventricular response (HCC) -     [id number]  HCl ER Coated Beads; Take 1 capsule (120 mg total) by mouth daily.  Dispense: 90 capsule; Refill: 0  Screening mammogram for breast cancer -     Digital Screening Mammogram, Left and Right; Future     Follow-up: Return in about 3 months (around 01/12/2024).  Sandra Crouch, MD

## 2023-10-12 NOTE — Patient Instructions (Signed)

## 2023-10-13 ENCOUNTER — Ambulatory Visit: Payer: Self-pay | Admitting: Internal Medicine

## 2023-10-13 LAB — BASIC METABOLIC PANEL WITH GFR
BUN: 20 mg/dL (ref 7–25)
CO2: 24 mmol/L (ref 20–32)
Calcium: 9.5 mg/dL (ref 8.6–10.4)
Chloride: 106 mmol/L (ref 98–110)
Creat: 0.94 mg/dL (ref 0.50–1.05)
Glucose, Bld: 139 mg/dL — ABNORMAL HIGH (ref 65–99)
Potassium: 4.4 mmol/L (ref 3.5–5.3)
Sodium: 141 mmol/L (ref 135–146)
eGFR: 67 mL/min/{1.73_m2} (ref >=60)

## 2023-10-13 LAB — CBC WITH DIFFERENTIAL/PLATELET
Absolute Lymphocytes: 2530 {cells}/uL (ref 850–3900)
Absolute Monocytes: 671 {cells}/uL (ref 200–950)
Basophils Absolute: 66 {cells}/uL (ref 0–200)
Basophils Relative: 0.6 %
Eosinophils Absolute: 110 {cells}/uL (ref 15–500)
Eosinophils Relative: 1 %
HCT: 37.2 % (ref 35.0–45.0)
Hemoglobin: 11.1 g/dL — ABNORMAL LOW (ref 11.7–15.5)
MCH: 23.4 pg — ABNORMAL LOW (ref 27.0–33.0)
MCHC: 29.8 g/dL — ABNORMAL LOW (ref 32.0–36.0)
MCV: 78.3 fL — ABNORMAL LOW (ref 80.0–100.0)
MPV: 10.2 fL (ref 7.5–12.5)
Monocytes Relative: 6.1 %
Neutro Abs: 7623 {cells}/uL (ref 1500–7800)
Neutrophils Relative %: 69.3 %
Platelets: 374 10*3/uL (ref 140–400)
RBC: 4.75 10*6/uL (ref 3.80–5.10)
RDW: 18.1 % — ABNORMAL HIGH (ref 11.0–15.0)
Total Lymphocyte: 23 %
WBC: 11 10*3/uL — ABNORMAL HIGH (ref 3.8–10.8)

## 2023-10-13 LAB — IRON,TIBC AND FERRITIN PANEL
%SAT: 6 % — ABNORMAL LOW (ref 16–45)
Ferritin: 5 ng/mL — ABNORMAL LOW (ref 16–288)
Iron: 20 ug/dL — ABNORMAL LOW (ref 45–160)
TIBC: 345 ug/dL (ref 250–450)

## 2023-10-13 LAB — HEMOGLOBIN A1C
Hgb A1c MFr Bld: 8.9 % — ABNORMAL HIGH (ref <5.7)
Mean Plasma Glucose: 209 mg/dL
eAG (mmol/L): 11.6 mmol/L

## 2023-10-13 LAB — FOLATE: Folate: 10.6 ng/mL

## 2023-10-13 LAB — TSH: TSH: 0.7 m[IU]/L (ref 0.40–4.50)

## 2023-10-13 MED ORDER — ACCRUFER 30 MG PO CAPS: 1.0000 | ORAL_CAPSULE | Freq: Two times a day (BID) | ORAL | 0 refills | Status: DC

## 2023-10-14 DIAGNOSIS — Z1231 Encounter for screening mammogram for malignant neoplasm of breast: Secondary | ICD-10-CM | POA: Insufficient documentation

## 2023-10-15 ENCOUNTER — Ambulatory Visit (INDEPENDENT_AMBULATORY_CARE_PROVIDER_SITE_OTHER)

## 2023-10-15 VITALS — Ht 65.0 in | Wt 175.0 lb

## 2023-10-15 DIAGNOSIS — Z0000 Encounter for general adult medical examination without abnormal findings: Secondary | ICD-10-CM | POA: Diagnosis not present

## 2023-10-15 NOTE — Progress Notes (Signed)
 Subjective:   Theresa Harrington is a 68 y.o. who presents for a Medicare Wellness preventive visit.  As a reminder, Annual Wellness Visits don't include a physical exam, and some assessments may be limited, especially if this visit is performed virtually. We may recommend an in-person follow-up visit with your provider if needed.  Visit Complete: Virtual I connected with  Theresa Harrington on 10/15/23 by a audio enabled telemedicine application and verified that I am speaking with the correct person using two identifiers.  Patient Location: Home  Provider Location: Home Office  I discussed the limitations of evaluation and management by telemedicine. The patient expressed understanding and agreed to proceed.  Vital Signs: Because this visit was a virtual/telehealth visit, some criteria may be missing or patient reported. Any vitals not documented were not able to be obtained and vitals that have been documented are patient reported.  VideoDeclined- This patient declined Librarian, academic. Therefore the visit was completed with audio only.  Persons Participating in Visit: Patient.  AWV Questionnaire: No: Patient Medicare AWV questionnaire was not completed prior to this visit.  Cardiac Risk Factors include: advanced age (>50men, >73 women);hypertension;diabetes mellitus;Other (see comment);dyslipidemia, Risk factor comments: PAD, A-fib     Objective:    Today's Vitals   10/15/23 1329  Weight: 175 lb (79.4 kg)  Height: 5' 5 (1.651 m)   Body mass index is 29.12 kg/m.     10/15/2023    1:43 PM 04/09/2023    8:29 AM 04/07/2023    4:26 AM 04/06/2023    2:46 PM 03/09/2023    7:55 PM 03/06/2023    1:41 PM 03/06/2023    7:31 AM  Advanced Directives  Does Patient Have a Medical Advance Directive? Yes No  No No No No  Type of Estate agent of [place];Living will        Copy of Healthcare Power of Attorney in Chart? No - copy requested         Would patient like information on creating a medical advance directive?  No - Patient declined No - Patient declined  No - Guardian declined      Current Medications (verified) Outpatient Encounter Medications as of 10/15/2023  Medication Sig   aspirin  EC 81 MG tablet Take 1 tablet (81 mg total) by mouth daily. Swallow whole. (Patient taking differently: Take 325 mg by mouth daily. Swallow whole.)   bisacodyl  (DULCOLAX) 5 MG EC tablet Take 5 mg by mouth daily as needed for moderate constipation.   Blood Glucose Monitoring Suppl (ONETOUCH VERIO IQ SYSTEM) w/Device KIT USE TO CHECK SUGAR DAILY   cetirizine  (ZYRTEC ) 10 MG tablet Take 10 mg by mouth daily as needed for allergies.   diltiazem  (CARDIZEM  CD) 120 MG 24 hr capsule Take 1 capsule (120 mg total) by mouth daily.   Docusate Sodium  (COLACE PO) Take 1 tablet by mouth daily as needed.   dorzolamide -timolol  (COSOPT ) 22.3-6.8 MG/ML ophthalmic solution Place 1 drop into the right eye 2 (two) times daily.    ezetimibe  (ZETIA ) 10 MG tablet Take 1 tablet by mouth once daily   Ferric Maltol  (ACCRUFER ) 30 MG CAPS Take 1 capsule (30 mg total) by mouth in the morning and at bedtime.   hydrALAZINE  (APRESOLINE ) 100 MG tablet Take 1 tablet (100 mg total) by mouth in the morning and at bedtime.   Insulin  Glargine (BASAGLAR  KWIKPEN) 100 UNIT/ML DIAL  AND INJECT 46 UNITS UNDER THE SKIN DAILY. NEEDS VISIT WITH DR. Rochelle Chu FOR REFILLS.  insulin  lispro (HUMALOG  KWIKPEN) 100 UNIT/ML KwikPen Inject 14 Units into the skin with breakfast, with lunch, and with evening meal. DIAL  14 UNITS AND INJECT UNDER THE SKIN THREE TIMES A DAY.   Insulin  Pen Needle (PEN NEEDLES) 32G X 4 MM MISC Inject 1 pen  as directed 4 (four) times daily. Use to inject levemir  or novolog  as directed.   latanoprost  (XALATAN ) 0.005 % ophthalmic solution Place 1 drop into both eyes at bedtime.    losartan  (COZAAR ) 25 MG tablet Take 1 tablet (25 mg total) by mouth daily. NEEDS APPOINTMENT WITH DR.  Rochelle Chu FOR REFILLS   MAGNESIUM -OXIDE 400 (240 Mg) MG tablet Take 1 tablet by mouth once daily   nebivolol  (BYSTOLIC ) 10 MG tablet Take 1 tablet by mouth once daily   ONETOUCH VERIO test strip USE TO check blood glucose TWICE DAILY   pantoprazole  (PROTONIX ) 40 MG tablet Take 1 tablet by mouth twice daily   rosuvastatin  (CRESTOR ) 5 MG tablet Take 1 tablet by mouth once daily   Semaglutide , 1 MG/DOSE, (OZEMPIC , 1 MG/DOSE,) 4 MG/3ML SOPN Inject 1 mg into the skin once a week. Via [employer] patient assistance   SYNJARDY  5-500 MG TABS TAKE 1 TABLET BY MOUTH TWICE DAILY   XARELTO  20 MG TABS tablet TAKE 1 TABLET BY MOUTH ONCE DAILY . APPOINTMENT REQUIRED FOR FUTURE REFILLS WITH  DR.  Rochelle Chu.   No facility-administered encounter medications on file as of 10/15/2023.    Allergies (verified) Amlodipine , Atorvastatin , Ampicillin , Codeine, Lisinopril, Penicillins, and Januvia  [sitagliptin ]   History: Past Medical History:  Diagnosis Date   Allergy    Anxiety    Pt. denies   Arthritis    right index figer   Asymptomatic cholelithiasis    Atherosclerosis of aorta (HCC)    Atrial flutter, paroxysmal (HCC) 02/20/2023   Cataract    Clotting disorder (HCC)    Gallstones 06/2013   GERD (gastroesophageal reflux disease)    Glaucoma    A LITTLE BIT   History of kidney stones    lithotrispy   Hyperlipidemia    Hypertension    Hypothyroidism    no meds   Iron deficiency anemia    PAD (peripheral artery disease) (HCC)    PAF (paroxysmal atrial fibrillation) (HCC)    Peripheral arterial occlusive disease (HCC)    lower extremities   Pneumonia    PONV (postoperative nausea and vomiting)    Right ureteral stone    Type 2 diabetes mellitus (HCC)    Type   Vitamin B 12 deficiency    Wears glasses    Past Surgical History:  Procedure Laterality Date   ABDOMINAL AORTOGRAM W/LOWER EXTREMITY N/A 03/09/2020   Procedure: ABDOMINAL AORTOGRAM W/LOWER EXTREMITY;  Surgeon: Margherita Shell, MD;   [facility]: [facility];  Service: Cardiovascular;  Laterality: N/A;   ABDOMINAL AORTOGRAM W/LOWER EXTREMITY N/A 03/06/2023   Procedure: ABDOMINAL AORTOGRAM W/LOWER EXTREMITY;  Surgeon: Margherita Shell, MD;  Location: [facility];  Service: Cardiovascular;  Laterality: N/A;   AMPUTATION Left 07/16/2014   Procedure: LEFT SECOND TOE AMPUTATION;  Surgeon: Margherita Shell, MD;  Location[facility] OR;  Service: Vascular;  Laterality: Left;  With Nerve block   ATRIAL FIBRILLATION ABLATION N/A 08/02/2021   Procedure: ATRIAL FIBRILLATION ABLATION;  Surgeon: Lei Pump, MD;  Location: [facility];  Service: Cardiovascular;  Laterality: N/A;   AUGMENTATION MAMMAPLASTY     BELPHAROPTOSIS REPAIR     eyelid lift   BIOPSY  10/14/2018   Procedure: BIOPSY;  Surgeon: Normie Becton., MD;  Location: [facility] ENDOSCOPY;  Service: Gastroenterology;;   BIOPSY  08/04/2019   Procedure: BIOPSY;  Surgeon: Normie Becton., MD;  Location: [facility] ENDOSCOPY;  Service: Gastroenterology;;   CARDIOVERSION N/A 09/19/2019   Procedure: CARDIOVERSION;  Surgeon: Sheryle Donning, MD;  Location: [facility] ENDOSCOPY;  Service: Cardiovascular;  Laterality: N/A;   COLONOSCOPY     COLONOSCOPY WITH PROPOFOL  Left 04/11/2023   Procedure: COLONOSCOPY WITH PROPOFOL ;  Surgeon: Albertina Hugger, MD;  Location: [facility] ENDOSCOPY;  Service: Gastroenterology;  Laterality: Left;   COMBINED AUGMENTATION MAMMAPLASTY AND ABDOMINOPLASTY  2009   W/  BILATERAL  THIGH LIFT   CYSTO/  RIGHT URETERAL STENT PLACEMENT  12/30/2010   CYSTOSCOPY WITH RETROGRADE PYELOGRAM, URETEROSCOPY AND STENT PLACEMENT Right 10/07/2013   Procedure: CYSTOSCOPY WITH RETROGRADE PYELOGRAM, right URETEROSCOPY AND STENT PLACEMENT, stone extraction;  Surgeon: Arleen Bells, MD;  Location: [facility];  Service: Urology;  Laterality: Right;   CYSTOSCOPY WITH STENT PLACEMENT Right 06/04/2013   Procedure: CYSTOSCOPY WITH STENT PLACEMENT;   Surgeon: Trent Frizzle, MD;  Location: [facility] ORS;  Service: Urology;  Laterality: Right;   DILATATION & CURETTAGE/HYSTEROSCOPY WITH MYOSURE N/A 04/27/2015   Procedure: DILATATION & CURETTAGE/HYSTEROSCOPY WITH MYOSURE;  Surgeon: Davia Erps, MD;  Location: [facility] ORS;  Service: Gynecology;  Laterality: N/A;   ENDARTERECTOMY FEMORAL Left 06/08/2014   Procedure: Left Leg Common Femoral and External Iliac  Endartarectomy with patch Angioplasty;  Surgeon: Mayo Speck, MD;  Location: [facility] OR;  Service: Vascular;  Laterality: Left;   ENTEROSCOPY N/A 04/09/2023   Procedure: ENTEROSCOPY;  Surgeon: Albertina Hugger, MD;  Location: [facility] ENDOSCOPY;  Service: Gastroenterology;  Laterality: N/A;   ESOPHAGOGASTRODUODENOSCOPY N/A 10/14/2018   Procedure: ESOPHAGOGASTRODUODENOSCOPY (EGD);  Surgeon: Normie Becton., MD;  Location: [facility] ENDOSCOPY;  Service: Gastroenterology;  Laterality: N/A;   ESOPHAGOGASTRODUODENOSCOPY (EGD) WITH PROPOFOL  N/A 11/06/2018   Procedure: ESOPHAGOGASTRODUODENOSCOPY (EGD) WITH PROPOFOL ;  Surgeon: Brice Campi Albino Alu., MD;  Location: [facility] ENDOSCOPY;  Service: Gastroenterology;  Laterality: N/A;  RFA   ESOPHAGOGASTRODUODENOSCOPY (EGD) WITH PROPOFOL  N/A 02/12/2019   Procedure: ESOPHAGOGASTRODUODENOSCOPY (EGD) WITH PROPOFOL ;  Surgeon: Brice Campi Albino Alu., MD;  Location: [facility] ENDOSCOPY;  Service: Gastroenterology;  Laterality: N/A;   ESOPHAGOGASTRODUODENOSCOPY (EGD) WITH PROPOFOL  N/A 08/04/2019   Procedure: ESOPHAGOGASTRODUODENOSCOPY (EGD) WITH PROPOFOL ;  Surgeon: Brice Campi Albino Alu., MD;  Location: [facility] ENDOSCOPY;  Service: Gastroenterology;  Laterality: N/A;  WITH RFA   EXTRACORPOREAL SHOCK WAVE LITHOTRIPSY Right 08-04-2013//   06-23-2013//   01-16-2011   EYE SURGERY Bilateral    cataract   FEMORAL-POPLITEAL BYPASS GRAFT Left 06/08/2014   Procedure: Left Leg Femoral -Popliteal Bypass Graft;  Surgeon: Mayo Speck, MD;  Location: [facility] OR;  Service: Vascular;  Laterality: Left;    FEMORAL-POPLITEAL BYPASS GRAFT Left 06/09/2014   Procedure: Left Femoral and Popliteal Exposure; Left Femoral to Anterior Tibial Bypass Graft using Propaten 6mm by 80cm Goretex Graft; Left Tibial Endarterectomy; Left Femoraland Popliteal Thrombectomy ;  Surgeon: Margherita Shell, MD;  Location: [facility] OR;  Service: Vascular;  Laterality: Left;   FEMORAL-TIBIAL BYPASS GRAFT Right 03/09/2023   Procedure: RIGHT FEMORAL-POSTERIOR TIBIAL ARTERY BYPASS USING X 80CM PROPATEN GRAFT;  Surgeon: Margherita Shell, MD;  Location: [facility] OR;  Service: Vascular;  Laterality: Right;   GI RADIOFREQUENCY ABLATION N/A 11/06/2018   Procedure: GI RADIOFREQUENCY ABLATION;  Surgeon: Normie Becton., MD;  Location: [facility] ENDOSCOPY;  Service: Gastroenterology;  Laterality: N/A;   GI RADIOFREQUENCY  ABLATION N/A 02/12/2019   Procedure: GI RADIOFREQUENCY ABLATION;  Surgeon: Brice Campi Albino Alu., MD;  Location: [facility]OSCOPY;  Service: Gastroenterology;  Laterality: N/A;   GI RADIOFREQUENCY ABLATION N/A 08/04/2019   Procedure: GI RADIOFREQUENCY ABLATION;  Surgeon: Brice Campi Albino Alu., MD;  Location: [facility] ENDOSCOPY;  Service: Gastroenterology;  Laterality: N/A;   GIVENS CAPSULE STUDY N/A 04/09/2023   Procedure: GIVENS CAPSULE STUDY;  Surgeon: Albertina Hugger, MD;  Location: [facility] ENDOSCOPY;  Service: Gastroenterology;  Laterality: N/A;   HOLMIUM LASER APPLICATION Right 10/07/2013   Procedure: HOLMIUM LASER APPLICATION;  Surgeon: Arleen Bells, MD;  Location: [facility];  Service: Urology;  Laterality: Right;   KIDNEY STONE SURGERY  07/2013   1-2 stones   LAPAROSCOPIC GASTRIC BANDING  05/29/2005   LITHOTRIPSY  2-3 times   LOWER EXTREMITY ANGIOGRAM N/A 06/04/2014   Procedure: LOWER EXTREMITY ANGIOGRAM;  Surgeon: Margherita Shell, MD;  [facility] C[facility] LAB;  Service: Cardiovascular;  Laterality: N/A;   ORIF FIFTH METACARPAL FX  RIGHT HAND  04/21/2002   PATCH ANGIOPLASTY Right 03/09/2023   Procedure: VEIN PATCH  ANGIOPLASTY TO RIGHT FEMORAL ARTERY;  Surgeon: Margherita Shell, MD;  Location: [facility] OR;  Service: Vascular;  Laterality: Right;   POLYPECTOMY     REVISION AND RE-SITING LAP-BAND PORT  04/08/2010   W/  UPPER EGD   RIGHT KNEE PATELLECTOMY W/ REPAIR OF EXTENSOR MECHANISM  04/14/2002   TEE WITHOUT CARDIOVERSION N/A 12/24/2019   Procedure: TRANSESOPHAGEAL ECHOCARDIOGRAM (TEE);  Surgeon: Jacqueline Matsu, MD;  Location: [facility] ENDOSCOPY;  Service: Cardiovascular;  Laterality: N/A;   Toenail removed Left 05/21/2014   2nd toenail-  Dr. Meriam Stamp   Family History  Problem Relation Age of Onset   Kidney disease Mother    Hyperlipidemia Mother    Other Mother        AAA   and    Amputation   Diabetes Father    Heart disease Father    Deep vein thrombosis Father    Hyperlipidemia Father    Diabetes Sister    Heart disease Sister    Deep vein thrombosis Sister    Hyperlipidemia Sister    Diabetes Brother    Heart disease Brother    Cancer Brother    Hyperlipidemia Brother    Diabetes Brother    Diabetes Brother    Colon polyps Brother    Colon cancer Maternal Aunt    Arthritis Other    Cancer Other        colon   Hypertension Other    Stroke Other    Esophageal cancer Neg Hx    Stomach cancer Neg Hx    Inflammatory bowel disease Neg Hx    Liver disease Neg Hx    Pancreatic cancer Neg Hx    Crohn's disease Neg Hx    Rectal cancer Neg Hx    Social History   Socioeconomic History   Marital status: Single    Spouse name: Not on file   Number of children: Not on file   Years of education: Not on file   Highest education level: Not on file  Occupational History   Occupation: Higher education careers adviser: [employer]  Tobacco Use   Smoking status: Former    Current packs/day: 0.00    Average packs/day: 1 pack/day for 28.0 years (28.0 ttl pk-yrs)    Types: Cigarettes    Start date: 09/05/1973    Quit date: 09/05/2001  Years since quitting: 22.1    Passive exposure: Never   Smokeless  tobacco: Never  Vaping Use   Vaping status: Never Used  Substance and Sexual Activity   Alcohol use: No    Alcohol/week: 0.0 standard drinks of alcohol   Drug use: No   Sexual activity: Yes  Other Topics Concern   Not on file  Social History Narrative   Regular exercise- yes   Lives alone/2025   Social Drivers of Health   Financial Resource Strain: Low Risk  (10/15/2023)   Overall Financial Resource Strain (CARDIA)    Difficulty of Paying Living Expenses: Not hard at all  Food Insecurity: No Food Insecurity (10/15/2023)   Hunger Vital Sign    Worried About Running Out of Food in the Last Year: Never true    Ran Out of Food in the Last Year: Never true  Transportation Needs: No Transportation Needs (10/15/2023)   PRAPARE - Administrator, Civil Service (Medical): No    Lack of Transportation (Non-Medical): No  Physical Activity: Sufficiently Active (10/15/2023)   Exercise Vital Sign    Days of Exercise per Week: 7 days    Minutes of Exercise per Session: 30 min  Stress: No Stress Concern Present (10/15/2023)   Theresa Harrington of Occupational Health - Occupational Stress Questionnaire    Feeling of Stress: Not at all  Social Connections: Moderately Integrated (10/15/2023)   Social Connection and Isolation Panel    Frequency of Communication with Friends and Family: More than three times a week    Frequency of Social Gatherings with Friends and Family: Twice a week    Attends Religious Services: More than 4 times per year    Active Member of [employer] or Organizations: Yes    Attends Banker Meetings: Never    Marital Status: Never married    Tobacco Counseling Counseling given: Not Answered    Clinical Intake:  Pre-visit preparation completed: Yes  Pain : No/denies pain     BMI - recorded: 29.12 Nutritional Status: BMI 25 -29 Overweight Nutritional Risks: None Diabetes: Yes CBG done?: No Did pt. bring in CBG monitor from home?: No  Lab  Results  Component Value Date   HGBA1C 8.9 (H) 10/12/2023   HGBA1C 8.7 (H) 10/08/2023   HGBA1C 6.3 (A) 06/07/2023     How often do you need to have someone help you when you read instructions, pamphlets, or other written materials from your doctor or pharmacy?: 1 - Never  Interpreter Needed?: No  Information entered by :: Josselin Gaulin, RMA   Activities of Daily Living     10/15/2023    1:31 PM 04/07/2023    4:29 AM  In your present state of health, do you have any difficulty performing the following activities:  Hearing? 0 0  Vision? 0 0  Difficulty concentrating or making decisions? 0 0  Walking or climbing stairs? 0   Dressing or bathing? 0   Doing errands, shopping? 0   Preparing Food and eating ? N   Using the Toilet? N   In the past six months, have you accidently leaked urine? N   Do you have problems with loss of bowel control? N   Managing your Medications? N   Managing your Finances? N   Housekeeping or managing your Housekeeping? N     Patient Care Team: Arcadio Knuckles, MD as PCP - General Renna Cary Myrtle Atta, MD as PCP - Cardiology (Cardiology) Lei Pump, MD  as PCP - Electrophysiology (Cardiology) Mignon Alberts, DPM as Consulting Physician (Podiatry) Natalia Bailer, Delaney Fearing, [facility] (Inactive) as Pharmacist (Pharmacist)  I have updated your Care Teams any recent Medical Services you may have received from other providers in the past year.     Assessment:   This is a routine wellness examination for Theresa Harrington.  Hearing/Vision screen Hearing Screening - Comments:: Denies hearing difficulties   Vision Screening - Comments:: Denies vision issues. Dr. Candi Chafe   Goals Addressed   None    Depression Screen     10/15/2023    1:46 PM 10/12/2023    9:57 AM 04/06/2023    9:54 AM 09/13/2022    9:48 AM 06/07/2022    3:04 PM 03/22/2022    9:03 AM 12/07/2021   11:11 AM  PHQ 2/9 Scores  PHQ - 2 Score 3 4 0 0 0 0 0  PHQ- 9 Score 3 12 2  0 0      Fall Risk      10/15/2023    1:44 PM 10/12/2023    9:56 AM 04/06/2023    9:53 AM 03/22/2022    9:03 AM 12/07/2021   11:12 AM  Fall Risk   Falls in the past year? 0 0 0 0 0  Number falls in past yr: 0 0 0 0 0  Injury with Fall? 0 0 0 0 0  Risk for fall due to :  No Fall Risks No Fall Risks No Fall Risks No Fall Risks  Follow up Falls evaluation completed;Falls prevention discussed Falls evaluation completed Falls evaluation completed Falls evaluation completed  Falls evaluation completed      Data saved with a previous flowsheet row definition    MEDICARE RISK AT HOME:  Medicare Risk at Home Any stairs in or around the home?: Yes If so, are there any without handrails?: No Home free of loose throw rugs in walkways, pet beds, electrical cords, etc?: Yes Adequate lighting in your home to reduce risk of falls?: Yes Life alert?: No Use of a cane, walker or w/c?: No Grab bars in the bathroom?: Yes Shower chair or bench in shower?: Yes Elevated toilet seat or a handicapped toilet?: Yes  TIMED UP AND GO:  Was the test performed?  No  Cognitive Function: Declined/Normal: No cognitive concerns noted by patient or family. Patient alert, oriented, able to answer questions appropriately and recall recent events. No signs of memory loss or confusion.        12/07/2021   11:13 AM  6CIT Screen  What Year? 0 points  What month? 0 points  What time? 0 points  Count back from 20 0 points  Months in reverse 0 points  Repeat phrase 0 points  Total Score 0 points    Immunizations Immunization History  Administered Date(s) Administered   Fluad Trivalent(High Dose 65+) 02/20/2023   Influenza Split 04/03/2012   Influenza Whole 02/13/2013   Influenza,inj,Quad PF,6+ Mos 02/05/2014, 01/27/2015, 02/03/2016, 02/01/2017, 03/05/2018, 01/08/2019, 01/07/2020, 12/30/2020   Influenza-Unspecified 03/01/2022   Moderna SARS-COV2 Booster Vaccination 02/23/2020, 11/12/2020   Moderna Sars-Covid-2 Vaccination 07/02/2019,  07/29/2019   PPD Test 05/07/2013   Pneumococcal Conjugate-13 06/25/2014   Pneumococcal Polysaccharide-23 12/21/2011, 05/10/2017, 06/07/2022   Td 08/05/2008   Tdap 10/15/2017   Zoster Recombinant(Shingrix) 01/25/2019, 03/29/2019    Screening Tests Health Maintenance  Topic Date Due   COVID-19 Vaccine (5 - 2024-25 season) 12/31/2022   MAMMOGRAM  07/01/2023   OPHTHALMOLOGY EXAM  09/27/2023   INFLUENZA VACCINE  11/30/2023  HEMOGLOBIN A1C  04/12/2024   Diabetic kidney evaluation - eGFR measurement  10/11/2024   Diabetic kidney evaluation - Urine ACR  10/11/2024   FOOT EXAM  10/11/2024   Medicare Annual Wellness (AWV)  10/14/2024   Colonoscopy  04/10/2026   DTaP/Tdap/Td (3 - Td or Tdap) 10/16/2027   Pneumococcal Vaccine: 50+ Years  Completed   DEXA SCAN  Completed   Hepatitis C Screening  Completed   Zoster Vaccines- Shingrix  Completed   HPV VACCINES  Aged Out   Meningococcal B Vaccine  Aged Out   Fecal DNA (Cologuard)  Discontinued    Health Maintenance  Health Maintenance Due  Topic Date Due   COVID-19 Vaccine (5 - 2024-25 season) 12/31/2022   MAMMOGRAM  07/01/2023   OPHTHALMOLOGY EXAM  09/27/2023   Health Maintenance Items Addressed: Mammogram ordered, See Nurse Notes at the end of this note  Additional Screening:  Vision Screening: Recommended annual ophthalmology exams for early detection of glaucoma and other disorders of the eye. Would you like a referral to an eye doctor? No    Dental Screening: Recommended annual dental exams for proper oral hygiene  Community Resource Referral / Chronic Care Management: CRR required this visit?  No   CCM required this visit?  No   Plan:    I have personally reviewed and noted the following in the patient's chart:   Medical and social history Use of alcohol, tobacco or illicit drugs  Current medications and supplements including opioid prescriptions. Patient is not currently taking opioid prescriptions. Functional  ability and status Nutritional status Physical activity Advanced directives List of other physicians Hospitalizations, surgeries, and ER visits in previous 12 months Vitals Screenings to include cognitive, depression, and falls Referrals and appointments  In addition, I have reviewed and discussed with patient certain preventive protocols, quality metrics, and best practice recommendations. A written personalized care plan for preventive services as well as general preventive health recommendations were provided to patient.   Lasha Echeverria L Tinisha Etzkorn, CMA   10/15/2023   After Visit Summary: (MyChart) Due to this being a telephonic visit, the after visit summary with patients personalized plan was offered to patient via MyChart   Notes: Please refer to Routing Comments.

## 2023-10-15 NOTE — Patient Instructions (Signed)
 Theresa Harrington , Thank you for taking time out of your busy schedule to complete your Annual Wellness Visit with me. I enjoyed our conversation and look forward to speaking with you again next year. I, as well as your care team,  appreciate your ongoing commitment to your health goals. Please review the following plan we discussed and let me know if I can assist you in the future. Your Game plan/ To Do List    Referrals: If you haven't heard from the office you've been referred to, please reach out to them at the phone provided.  You have an order for:   [x]   3D Mammogram      Please call for appointment:  [facility] [address] [place], [place] [address] [phone]   Make sure to wear two-piece clothing.  No lotions, powders, or deodorants the day of the appointment. Make sure to bring picture ID and insurance card.  Bring list of medications you are currently taking including any supplements.   Follow up Visits: Next Medicare AWV with our clinical staff: 10/15/2024.   Have you seen your provider in the last 6 months (3 months if uncontrolled diabetes)? Yes Next Office Visit with your provider: Last office visit on 10/12/2023.  Clinician Recommendations:  Aim for 30 minutes of exercise or brisk walking, 6-8 glasses of water , and 5 servings of fruits and vegetables each day.       This is a list of the screening recommended for you and due dates:  Health Maintenance  Topic Date Due   Medicare Annual Wellness Visit  12/08/2022   COVID-19 Vaccine (5 - 2024-25 season) 12/31/2022   Mammogram  07/01/2023   Eye exam for diabetics  09/27/2023   Flu Shot  11/30/2023   Hemoglobin A1C  04/12/2024   Yearly kidney function blood test for diabetes  10/11/2024   Yearly kidney health urinalysis for diabetes  10/11/2024   Complete foot exam   10/11/2024   Colon Cancer Screening  04/10/2026   DTaP/Tdap/Td vaccine (3 - Td or Tdap) 10/16/2027   Pneumococcal Vaccine for  age over 76  Completed   DEXA scan (bone density measurement)  Completed   Hepatitis C Screening  Completed   Zoster (Shingles) Vaccine  Completed   HPV Vaccine  Aged Out   Meningitis B Vaccine  Aged Out   Cologuard (Stool DNA test)  Discontinued    Advanced directives: (Copy Requested) Please bring a copy of your health care power of attorney and living will to the office to be added to your chart at your convenience. You can mail to [facility] [address] [employer] [place], [place] [address] or email to [email or web address] Advance Care Planning is important because it:  [x]  Makes sure you receive the medical care that is consistent with your values, goals, and preferences  [x]  It provides guidance to your family and loved ones and reduces their decisional burden about whether or not they are making the right decisions based on your wishes.  Follow the link provided in your after visit summary or read over the paperwork we have mailed to you to help you started getting your Advance Directives in place. If you need assistance in completing these, please reach out to us  so that we can help you!  See attachments for Preventive Care and Fall Prevention Tips.

## 2023-10-17 ENCOUNTER — Encounter: Payer: Self-pay | Admitting: Internal Medicine

## 2023-10-17 ENCOUNTER — Ambulatory Visit: Payer: Self-pay | Admitting: Family

## 2023-10-19 ENCOUNTER — Telehealth: Payer: Self-pay | Admitting: *Deleted

## 2023-10-19 ENCOUNTER — Telehealth: Payer: Self-pay | Admitting: Internal Medicine

## 2023-10-19 NOTE — Progress Notes (Signed)
 Care Guide Pharmacy Note  10/19/2023 Name: Giavanna Kang MRN: [id number] DOB: June 15, 1955  Referred By: Arcadio Knuckles, MD Reason for referral: Call Attempt #1 and Complex Care Management (Outreach to schedule referral with pharmacist )   Theresa Harrington is a 68 y.o. year old female who is a primary care patient of Arcadio Knuckles, MD.  Maxie Spaniel was referred to the pharmacist for assistance related to: DMII  Successful contact was made with the patient to discuss pharmacy services including being ready for the pharmacist to call at least 5 minutes before the scheduled appointment time and to have medication bottles and any blood pressure readings ready for review. The patient agreed to meet with the pharmacist via telephone visit on 10/24/2023  Kandis Ormond, CMA [place], [facility] Guide Direct Dial : [phone]  Fax: [phone] Website: [email or web address]

## 2023-10-19 NOTE — Telephone Encounter (Signed)
 Copied from CRM [id number]. Topic: Clinical - Medical Advice >> Oct 19, 2023 11:35 AM Albertha Alosa wrote: Reason for CRM: Patient called in regarding iron infusion, wanted to know who is scheduling that and when, would like for a nurse to give her a callback regarding this

## 2023-10-23 ENCOUNTER — Ambulatory Visit (HOSPITAL_COMMUNITY)
Admission: RE | Admit: 2023-10-23 | Discharge: 2023-10-23 | Disposition: A | Discharge status: Home / Self Care | Source: Ambulatory Visit | Attending: Internal Medicine | Admitting: Internal Medicine

## 2023-10-23 ENCOUNTER — Other Ambulatory Visit: Payer: Self-pay | Admitting: Internal Medicine

## 2023-10-23 VITALS — BP 112/80 | HR 132 | Ht 65.0 in | Wt 180.6 lb

## 2023-10-23 DIAGNOSIS — I4892 Unspecified atrial flutter: Secondary | ICD-10-CM | POA: Diagnosis not present

## 2023-10-23 DIAGNOSIS — Z89422 Acquired absence of other left toe(s): Secondary | ICD-10-CM | POA: Diagnosis not present

## 2023-10-23 DIAGNOSIS — D6869 Other thrombophilia: Secondary | ICD-10-CM | POA: Diagnosis not present

## 2023-10-23 DIAGNOSIS — Z8631 Personal history of diabetic foot ulcer: Secondary | ICD-10-CM | POA: Diagnosis not present

## 2023-10-23 DIAGNOSIS — I4819 Other persistent atrial fibrillation: Secondary | ICD-10-CM

## 2023-10-23 DIAGNOSIS — I4891 Unspecified atrial fibrillation: Secondary | ICD-10-CM

## 2023-10-23 DIAGNOSIS — Z794 Long term (current) use of insulin: Secondary | ICD-10-CM | POA: Diagnosis not present

## 2023-10-23 DIAGNOSIS — E1151 Type 2 diabetes mellitus with diabetic peripheral angiopathy without gangrene: Secondary | ICD-10-CM | POA: Diagnosis not present

## 2023-10-23 NOTE — Patient Instructions (Addendum)
 Until cardioversion -  - Decrease losartan  to 1/2 of your 100mg  tablet   - Increase cardizem  to 120mg  twice a day Day of cardioversion return to normal dosing of losartan  100mg  daily and cardizem  120mg  daily   Cardioversion scheduled for: Monday, July 7th   - Arrive at the [facility] ([address]  and check in with ADMITTING at 7:30AM   - Do not eat or drink anything after midnight the night prior to your procedure.   - Take all your morning medication (except diabetic medications) with a sip of water  prior to arrival.  - Do NOT miss any doses of your blood thinner - if you should miss a dose or take a dose more than 4 hours late -- please notify our office immediately.  - You will not be able to drive home after your procedure. Please ensure you have a responsible adult to drive you home. You will need someone with you for 24 hours post procedure.     - Expect to be in the procedural area approximately 2 hours.   - If you feel as if you go back into normal rhythm prior to scheduled cardioversion, please notify our office immediately.   If your procedure is canceled in the cardioversion suite you will be charged a cancellation fee.    Hold below medications 7 days prior to scheduled procedure/anesthesia.  Restart medication on the normal dosing day after scheduled procedure/anesthesia  Semaglutide  (Ozempic )     Hold below medications 72 hours prior to scheduled procedure/anesthesia. Restart medication on the following day after scheduled procedure/anesthesia  Empagliflozin /metformin  (Synjardy /Synjardy  XR)        For those patients who have a scheduled procedure/anesthesia on the same day of the week as their dose, hold the medication on the day of surgery.  They can take their scheduled dose the week before.  **Patients on the above medications scheduled for elective procedures that have not held the medication for the  appropriate amount of time are at risk of cancellation or change in the anesthetic plan.

## 2023-10-23 NOTE — H&P (View-Only) (Signed)
 Primary Care Physician: Joshua Debby CROME, MD Referring Physician:Dr. Inocencio  Primary Cardiologist: Dr Jeffrie [name] is a 68 y.o. female with a h/o afib fem-pop bypass 06/08/14, DM, CAD, HTN, atrial fibrillation who presents for follow up in the [facility]. She had a DCCV in 2021 but had quick return of afib. Patient is s/p afib ablation with Dr Inocencio on 08/02/21.   On follow up 10/23/23, patient is currently in atrial flutter with RVR. Currently taking diltiazem  120 mg daily. No missed doses of Xarelto  20 mg daily. She took Ozempic  on Sunday.   Today, she denies symptoms of palpitations, chest pain, shortness of breath, orthopnea, PND, lower extremity edema, dizziness, presyncope, syncope, or neurologic sequela. The patient is tolerating medications without difficulties and is otherwise without complaint today.   Past Medical History:  Diagnosis Date   Allergy    Anxiety    Pt. denies   Arthritis    right index figer   Asymptomatic cholelithiasis    Atherosclerosis of aorta (HCC)    Atrial flutter, paroxysmal (HCC) 02/20/2023   Cataract    Clotting disorder (HCC)    Gallstones 06/2013   GERD (gastroesophageal reflux disease)    Glaucoma    A LITTLE BIT   History of kidney stones    lithotrispy   Hyperlipidemia    Hypertension    Hypothyroidism    no meds   Iron deficiency anemia    PAD (peripheral artery disease) (HCC)    PAF (paroxysmal atrial fibrillation) (HCC)    Peripheral arterial occlusive disease (HCC)    lower extremities   Pneumonia    PONV (postoperative nausea and vomiting)    Right ureteral stone    Type 2 diabetes mellitus (HCC)    Type   Vitamin B 12 deficiency    Wears glasses    Past Surgical History:  Procedure Laterality Date   ABDOMINAL AORTOGRAM W/LOWER EXTREMITY N/A 03/09/2020   Procedure: ABDOMINAL AORTOGRAM W/LOWER EXTREMITY;  Surgeon: Serene Gaile ORN, MD;  [facility]: [facility];  Service:  Cardiovascular;  Laterality: N/A;   ABDOMINAL AORTOGRAM W/LOWER EXTREMITY N/A 03/06/2023   Procedure: ABDOMINAL AORTOGRAM W/LOWER EXTREMITY;  Surgeon: Serene Gaile ORN, MD;  Location: [facility];  Service: Cardiovascular;  Laterality: N/A;   AMPUTATION Left 07/16/2014   Procedure: LEFT SECOND TOE AMPUTATION;  Surgeon: Gaile ORN Serene, MD;  Location: [facility] OR;  Service: Vascular;  Laterality: Left;  With Nerve block   ATRIAL FIBRILLATION ABLATION N/A 08/02/2021   Procedure: ATRIAL FIBRILLATION ABLATION;  Surgeon: Inocencio Soyla Lunger, MD;  Location: [facility];  Service: Cardiovascular;  Laterality: N/A;   AUGMENTATION MAMMAPLASTY     BELPHAROPTOSIS REPAIR     eyelid lift   BIOPSY  10/14/2018   Procedure: BIOPSY;  Surgeon: Wilhelmenia Aloha Raddle., MD;  Location: [facility] ENDOSCOPY;  Service: Gastroenterology;;   BIOPSY  08/04/2019   Procedure: BIOPSY;  Surgeon: Wilhelmenia Aloha Raddle., MD;  Location: [facility] ENDOSCOPY;  Service: Gastroenterology;;   CARDIOVERSION N/A 09/19/2019   Procedure: CARDIOVERSION;  Surgeon: Lonni Slain, MD;  Location: [facility];  Service: Cardiovascular;  Laterality: N/A;   COLONOSCOPY     COLONOSCOPY WITH PROPOFOL  Left 04/11/2023   Procedure: COLONOSCOPY WITH PROPOFOL ;  Surgeon: Legrand Victory CROME DOUGLAS, MD;  Location: [facility] ENDOSCOPY;  Service: Gastroenterology;  Laterality: Left;   COMBINED AUGMENTATION MAMMAPLASTY AND ABDOMINOPLASTY  2009   W/  BILATERAL  THIGH LIFT   CYSTO/  RIGHT  URETERAL STENT PLACEMENT  12/30/2010   CYSTOSCOPY WITH RETROGRADE PYELOGRAM, URETEROSCOPY AND STENT PLACEMENT Right 10/07/2013   Procedure: CYSTOSCOPY WITH RETROGRADE PYELOGRAM, right URETEROSCOPY AND STENT PLACEMENT, stone extraction;  Surgeon: Oliva VEAR Oiler, MD;  Location: [facility];  Service: Urology;  Laterality: Right;   CYSTOSCOPY WITH STENT PLACEMENT Right 06/04/2013   Procedure: CYSTOSCOPY WITH STENT PLACEMENT;  Surgeon: Garnette Shack, MD;  Location: [facility]  ORS;  Service: Urology;  Laterality: Right;   DILATATION & CURETTAGE/HYSTEROSCOPY WITH MYOSURE N/A 04/27/2015   Procedure: DILATATION & CURETTAGE/HYSTEROSCOPY WITH MYOSURE;  Surgeon: Curlee VEAR Guan, MD;  Location: [facility] ORS;  Service: Gynecology;  Laterality: N/A;   ENDARTERECTOMY FEMORAL Left 06/08/2014   Procedure: Left Leg Common Femoral and External Iliac  Endartarectomy with patch Angioplasty;  Surgeon: Krystal JULIANNA Doing, MD;  Location: [facility] OR;  Service: Vascular;  Laterality: Left;   ENTEROSCOPY N/A 04/09/2023   Procedure: ENTEROSCOPY;  Surgeon: Legrand Victory LITTIE DOUGLAS, MD;  Location: [facility] ENDOSCOPY;  Service: Gastroenterology;  Laterality: N/A;   ESOPHAGOGASTRODUODENOSCOPY N/A 10/14/2018   Procedure: ESOPHAGOGASTRODUODENOSCOPY (EGD);  Surgeon: Wilhelmenia Aloha Raddle., MD;  Location: [facility] ENDOSCOPY;  Service: Gastroenterology;  Laterality: N/A;   ESOPHAGOGASTRODUODENOSCOPY (EGD) WITH PROPOFOL  N/A 11/06/2018   Procedure: ESOPHAGOGASTRODUODENOSCOPY (EGD) WITH PROPOFOL ;  Surgeon: Wilhelmenia Aloha Raddle., MD;  Location: [facility] ENDOSCOPY;  Service: Gastroenterology;  Laterality: N/A;  RFA   ESOPHAGOGASTRODUODENOSCOPY (EGD) WITH PROPOFOL  N/A 02/12/2019   Procedure: ESOPHAGOGASTRODUODENOSCOPY (EGD) WITH PROPOFOL ;  Surgeon: Wilhelmenia Aloha Raddle., MD;  Location: [facility] ENDOSCOPY;  Service: Gastroenterology;  Laterality: N/A;   ESOPHAGOGASTRODUODENOSCOPY (EGD) WITH PROPOFOL  N/A 08/04/2019   Procedure: ESOPHAGOGASTRODUODENOSCOPY (EGD) WITH PROPOFOL ;  Surgeon: Wilhelmenia Aloha Raddle., MD;  Location: [facility] ENDOSCOPY;  Service: Gastroenterology;  Laterality: N/A;  WITH RFA   EXTRACORPOREAL SHOCK WAVE LITHOTRIPSY Right 08-04-2013//   06-23-2013//   01-16-2011   EYE SURGERY Bilateral    cataract   FEMORAL-POPLITEAL BYPASS GRAFT Left 06/08/2014   Procedure: Left Leg Femoral -Popliteal Bypass Graft;  Surgeon: Krystal JULIANNA Doing, MD;  Location: [facility] OR;  Service: Vascular;  Laterality: Left;   FEMORAL-POPLITEAL BYPASS GRAFT Left 06/09/2014    Procedure: Left Femoral and Popliteal Exposure; Left Femoral to Anterior Tibial Bypass Graft using Propaten 6mm by 80cm Goretex Graft; Left Tibial Endarterectomy; Left Femoraland Popliteal Thrombectomy ;  Surgeon: Gaile LELON New, MD;  Location: [facility] OR;  Service: Vascular;  Laterality: Left;   FEMORAL-TIBIAL BYPASS GRAFT Right 03/09/2023   Procedure: RIGHT FEMORAL-POSTERIOR TIBIAL ARTERY BYPASS USING X 80CM PROPATEN GRAFT;  Surgeon: New Gaile LELON, MD;  Location: [facility] OR;  Service: Vascular;  Laterality: Right;   GI RADIOFREQUENCY ABLATION N/A 11/06/2018   Procedure: GI RADIOFREQUENCY ABLATION;  Surgeon: Wilhelmenia Aloha Raddle., MD;  Location: [facility] ENDOSCOPY;  Service: Gastroenterology;  Laterality: N/A;   GI RADIOFREQUENCY ABLATION N/A 02/12/2019   Procedure: GI RADIOFREQUENCY ABLATION;  Surgeon: Wilhelmenia Aloha Raddle., MD;  Location: [facility] ENDOSCOPY;  Service: Gastroenterology;  Laterality: N/A;   GI RADIOFREQUENCY ABLATION N/A 08/04/2019   Procedure: GI RADIOFREQUENCY ABLATION;  Surgeon: Wilhelmenia Aloha Raddle., MD;  Location: [facility] ENDOSCOPY;  Service: Gastroenterology;  Laterality: N/A;   GIVENS CAPSULE STUDY N/A 04/09/2023   Procedure: GIVENS CAPSULE STUDY;  Surgeon: Legrand Victory LITTIE DOUGLAS, MD;  Location: [facility] ENDOSCOPY;  Service: Gastroenterology;  Laterality: N/A;   HOLMIUM LASER APPLICATION Right 10/07/2013   Procedure: HOLMIUM LASER APPLICATION;  Surgeon: Oliva VEAR Oiler, MD;  Location: [facility];  Service: Urology;  Laterality: Right;   KIDNEY STONE SURGERY  07/2013   1-2 stones   LAPAROSCOPIC GASTRIC BANDING  05/29/2005   LITHOTRIPSY  2-3 times   LOWER EXTREMITY ANGIOGRAM N/A 06/04/2014   Procedure: LOWER EXTREMITY ANGIOGRAM;  Surgeon: Gaile LELON New, MD;  Location: [facility] CATH LAB;  Service: Cardiovascular;  Laterality: N/A;   ORIF FIFTH METACARPAL FX  RIGHT HAND  04/21/2002   PATCH ANGIOPLASTY Right 03/09/2023   Procedure: VEIN PATCH ANGIOPLASTY TO RIGHT FEMORAL ARTERY;   Surgeon: New Gaile LELON, MD;  Location: [facility] OR;  Service: Vascular;  Laterality: Right;   POLYPECTOMY     REVISION AND RE-SITING LAP-BAND PORT  04/08/2010   W/  UPPER EGD   RIGHT KNEE PATELLECTOMY W/ REPAIR OF EXTENSOR MECHANISM  04/14/2002   TEE WITHOUT CARDIOVERSION N/A 12/24/2019   Procedure: TRANSESOPHAGEAL ECHOCARDIOGRAM (TEE);  Surgeon: Shlomo Wilbert SAUNDERS, MD;  Location: [facility] ENDOSCOPY;  Service: Cardiovascular;  Laterality: N/A;   Toenail removed Left 05/21/2014   2nd toenail-  Dr. Christine    Current Outpatient Medications  Medication Sig Dispense Refill   aspirin  EC 81 MG tablet Take 1 tablet (81 mg total) by mouth daily. Swallow whole. (Patient taking differently: Take 325 mg by mouth daily. Swallow whole.)     bisacodyl  (DULCOLAX) 5 MG EC tablet Take 5 mg by mouth daily as needed for moderate constipation. (Patient taking differently: Take 5 mg by mouth as needed for moderate constipation.)     Blood Glucose Monitoring Suppl (ONETOUCH VERIO IQ SYSTEM) w/Device KIT USE TO CHECK SUGAR DAILY 1 kit 0   diltiazem  (CARDIZEM  CD) 120 MG 24 hr capsule Take 1 capsule (120 mg total) by mouth daily. 90 capsule 0   diltiazem  (CARDIZEM ) 30 MG tablet Take 30 mg by mouth every 4 (four) hours as needed.     Docusate Sodium  (COLACE PO) Take 1 tablet by mouth daily as needed. (Patient taking differently: Take 1 tablet by mouth as needed.)     dorzolamide -timolol  (COSOPT ) 22.3-6.8 MG/ML ophthalmic solution Place 1 drop into the right eye 2 (two) times daily.      ezetimibe  (ZETIA ) 10 MG tablet Take 1 tablet by mouth once daily 90 tablet 0   hydrALAZINE  (APRESOLINE ) 100 MG tablet Take 1 tablet (100 mg total) by mouth in the morning and at bedtime.     Insulin  Glargine (BASAGLAR  KWIKPEN) 100 UNIT/ML DIAL  AND INJECT 46 UNITS UNDER THE SKIN DAILY. NEEDS VISIT WITH DR. JOSHUA FOR REFILLS. 15 mL 0   insulin  lispro (HUMALOG  KWIKPEN) 100 UNIT/ML KwikPen Inject 14 Units into the skin with breakfast, with lunch, and  with evening meal. DIAL  14 UNITS AND INJECT UNDER THE SKIN THREE TIMES A DAY. 45 mL 0   Insulin  Pen Needle (PEN NEEDLES) 32G X 4 MM MISC Inject 1 pen  as directed 4 (four) times daily. Use to inject levemir  or novolog  as directed. 400 each 3   latanoprost  (XALATAN ) 0.005 % ophthalmic solution Place 1 drop into both eyes at bedtime.      losartan  (COZAAR ) 25 MG tablet Take 1 tablet (25 mg total) by mouth daily. NEEDS APPOINTMENT WITH DR. JOSHUA FOR REFILLS (Patient taking differently: Take 100 mg by mouth daily. NEEDS APPOINTMENT WITH DR. JOSHUA FOR REFILLS) 30 tablet 0   MAGNESIUM -OXIDE 400 (240 Mg) MG tablet Take 1 tablet by mouth once daily 90 tablet 0   nebivolol  (BYSTOLIC ) 10 MG tablet Take 1 tablet by mouth once daily 90 tablet 3   ONETOUCH VERIO test strip USE TO check blood glucose TWICE DAILY  200 strip 3   pantoprazole  (PROTONIX ) 40 MG tablet Take 1 tablet by mouth twice daily 180 tablet 0   rosuvastatin  (CRESTOR ) 5 MG tablet Take 1 tablet by mouth once daily 90 tablet 1   Semaglutide , 1 MG/DOSE, (OZEMPIC , 1 MG/DOSE,) 4 MG/3ML SOPN Inject 1 mg into the skin once a week. Via [employer] patient assistance 9 mL 1   SYNJARDY  5-500 MG TABS TAKE 1 TABLET BY MOUTH TWICE DAILY 180 tablet 1   XARELTO  20 MG TABS tablet TAKE 1 TABLET BY MOUTH ONCE DAILY . APPOINTMENT REQUIRED FOR FUTURE REFILLS WITH  DR.  JOSHUA. 30 tablet 0   Ferric Maltol  (ACCRUFER ) 30 MG CAPS Take 1 capsule (30 mg total) by mouth in the morning and at bedtime. (Patient not taking: Reported on 10/23/2023) 180 capsule 0   No current facility-administered medications for this encounter.    Allergies  Allergen Reactions   Amlodipine  Swelling   Atorvastatin  Other (See Comments)    Muscle aches    Ampicillin  Nausea And Vomiting and Other (See Comments)   Codeine Nausea And Vomiting    Ask patient   Lisinopril Cough        Penicillins Nausea And Vomiting    Did it involve swelling of the face/tongue/throat, SOB, or low BP? No Did  it involve sudden or severe rash/hives, skin peeling, or any reaction on the inside of your mouth or nose? No Did you need to seek medical attention at a hospital or doctor's office? No When did it last happen?      20+ years If all above answers are NO, may proceed with cephalosporin use.    Januvia  [Sitagliptin ] Nausea And Vomiting   ROS- All systems are reviewed and negative except as per the HPI above  Physical Exam: Vitals:   10/23/23 1022  BP: 112/80  Pulse: (!) 132  Weight: 81.9 kg  Height: 5' 5 (1.651 m)     Wt Readings from Last 3 Encounters:  10/23/23 81.9 kg  10/15/23 79.4 kg  10/12/23 79.7 kg    Labs: Lab Results  Component Value Date   NA 141 10/12/2023   K 4.4 10/12/2023   CL 106 10/12/2023   CO2 24 10/12/2023   GLUCOSE 139 (H) 10/12/2023   BUN 20 10/12/2023   CREATININE 0.94 10/12/2023   CALCIUM  9.5 10/12/2023   MG 1.6 (L) 04/11/2023   Lab Results  Component Value Date   INR 1.0 03/09/2023   Lab Results  Component Value Date   CHOL 118 10/08/2023   HDL 46.60 10/08/2023   LDLCALC 38 10/08/2023   TRIG 164.0 (H) 10/08/2023   GEN- The patient is well appearing, alert and oriented x 3 today.   Neck - no JVD or carotid bruit noted Lungs- Clear to ausculation bilaterally, normal work of breathing Heart- Regular tachycardic rate and rhythm, no murmurs, rubs or gallops, PMI not laterally displaced Extremities- no clubbing, cyanosis, or edema Skin - no rash or ecchymosis noted   EKG today demonstrates Vent. rate 132 BPM PR interval * ms QRS duration 92 ms QT/QTcB 340/503 ms P-R-T axes * 46 96 Atrial flutter with RVR Possible Inferior infarct , age undetermined Possible Anterior infarct , age undetermined Abnormal ECG When compared with ECG of 30-Jul-2023 09:07, Atrial flutter has replaced sinus rhythm Confirmed by Terra Pac ([id number]) on 10/23/2023 10:59:30 AM  Echo 09/26/23:  1. Left ventricular ejection fraction, by estimation, is 60 to  65%. The  left ventricle has normal function.  Left ventricular endocardial border  not optimally defined to evaluate regional wall motion. Left ventricular  diastolic parameters are  indeterminate.   2. Right ventricular systolic function is normal. The right ventricular  size is mildly enlarged.   3. Left atrial size was severely dilated.   4. 2D Planimetry of 2.34 cm2. Continuity equation MVA 1.32 cm . The  mitral valve is degenerative. No evidence of mitral valve regurgitation.  Mild to moderate mitral stenosis. The mean mitral valve gradient is 5.0  mmHg with average heart rate of 61 bpm.   Severe mitral annular calcification.   5. The aortic valve is tricuspid. There is mild calcification of the  aortic valve. Aortic valve regurgitation is not visualized. Aortic valve  sclerosis is present, with no evidence of aortic valve stenosis.   6. Dilated pulmonary artery.   7. The inferior vena cava is normal in size with greater than 50%  respiratory variability, suggesting right atrial pressure of 3 mmHg.    CHA2DS2-VASc Score = 4  The patient's score is based upon: CHF History: 0 HTN History: 1 Diabetes History: 0 Stroke History: 0 Vascular Disease History: 1 Age Score: 1 Gender Score: 1       ASSESSMENT AND PLAN: 1. Persistent Atrial Fibrillation/atrial flutter The patient's CHA2DS2-VASc score is 4, indicating a 4.8% annual risk of stroke.   S/p afib ablation 08/02/21 by Dr. Inocencio.  She is currently in atrial flutter with RVR. We discussed the procedure cardioversion to try to convert to NSR. We discussed the risks vs benefits of this procedure and how ultimately we cannot predict whether a patient will have early return of arrhythmia post procedure. After discussion, the patient wishes to proceed with cardioversion. Labs drawn on 6/13. For improved rate control, will decrease losartan  to 50 mg daily and increase diltiazem  to 240 mg daily. She go back to original doses after  cardioversion.  Informed Consent   Shared Decision Making/Informed Consent The risks (stroke, cardiac arrhythmias rarely resulting in the need for a temporary or permanent pacemaker, skin irritation or burns and complications associated with conscious sedation including aspiration, arrhythmia, respiratory failure and death), benefits (restoration of normal sinus rhythm) and alternatives of a direct current cardioversion were explained in detail to Ms. Dasie and she agrees to proceed.       2. Secondary Hypercoagulable State (ICD10:  D68.69) The patient is at significant risk for stroke/thromboembolism based upon her CHA2DS2-VASc Score of 4.  Continue Rivaroxaban  (Xarelto ).  No missed doses.   3. HTN Stable today.  4. CAD CAC score on CT 704 On statin No chest pain.  5. PAD S/p fem pop bypass 2016 Followed by [facility]  6. VHD Mild-moderate MS Followed by Dr Jeffrie    Follow up 2 weeks after DCCV.    Dorn Heinrich, PA-C [facility] [address] [place], [place] [address] [phone]

## 2023-10-23 NOTE — Progress Notes (Signed)
 Primary Care Physician: Joshua Debby CROME, MD Referring Physician:Dr. Inocencio  Primary Cardiologist: Dr Jeffrie [name] is a 68 y.o. female with a h/o afib fem-pop bypass 06/08/14, DM, CAD, HTN, atrial fibrillation who presents for follow up in the [facility]. She had a DCCV in 2021 but had quick return of afib. Patient is s/p afib ablation with Dr Inocencio on 08/02/21.   On follow up 10/23/23, patient is currently in atrial flutter with RVR. Currently taking diltiazem  120 mg daily. No missed doses of Xarelto  20 mg daily. She took Ozempic  on Sunday.   Today, she denies symptoms of palpitations, chest pain, shortness of breath, orthopnea, PND, lower extremity edema, dizziness, presyncope, syncope, or neurologic sequela. The patient is tolerating medications without difficulties and is otherwise without complaint today.   Past Medical History:  Diagnosis Date   Allergy    Anxiety    Pt. denies   Arthritis    right index figer   Asymptomatic cholelithiasis    Atherosclerosis of aorta (HCC)    Atrial flutter, paroxysmal (HCC) 02/20/2023   Cataract    Clotting disorder (HCC)    Gallstones 06/2013   GERD (gastroesophageal reflux disease)    Glaucoma    A LITTLE BIT   History of kidney stones    lithotrispy   Hyperlipidemia    Hypertension    Hypothyroidism    no meds   Iron deficiency anemia    PAD (peripheral artery disease) (HCC)    PAF (paroxysmal atrial fibrillation) (HCC)    Peripheral arterial occlusive disease (HCC)    lower extremities   Pneumonia    PONV (postoperative nausea and vomiting)    Right ureteral stone    Type 2 diabetes mellitus (HCC)    Type   Vitamin B 12 deficiency    Wears glasses    Past Surgical History:  Procedure Laterality Date   ABDOMINAL AORTOGRAM W/LOWER EXTREMITY N/A 03/09/2020   Procedure: ABDOMINAL AORTOGRAM W/LOWER EXTREMITY;  Surgeon: Serene Gaile ORN, MD;  [facility]: [facility];  Service:  Cardiovascular;  Laterality: N/A;   ABDOMINAL AORTOGRAM W/LOWER EXTREMITY N/A 03/06/2023   Procedure: ABDOMINAL AORTOGRAM W/LOWER EXTREMITY;  Surgeon: Serene Gaile ORN, MD;  Location: [facility];  Service: Cardiovascular;  Laterality: N/A;   AMPUTATION Left 07/16/2014   Procedure: LEFT SECOND TOE AMPUTATION;  Surgeon: Gaile ORN Serene, MD;  Location: [facility] OR;  Service: Vascular;  Laterality: Left;  With Nerve block   ATRIAL FIBRILLATION ABLATION N/A 08/02/2021   Procedure: ATRIAL FIBRILLATION ABLATION;  Surgeon: Inocencio Soyla Lunger, MD;  Location: [facility];  Service: Cardiovascular;  Laterality: N/A;   AUGMENTATION MAMMAPLASTY     BELPHAROPTOSIS REPAIR     eyelid lift   BIOPSY  10/14/2018   Procedure: BIOPSY;  Surgeon: Wilhelmenia Aloha Raddle., MD;  Location: [facility] ENDOSCOPY;  Service: Gastroenterology;;   BIOPSY  08/04/2019   Procedure: BIOPSY;  Surgeon: Wilhelmenia Aloha Raddle., MD;  Location: [facility] ENDOSCOPY;  Service: Gastroenterology;;   CARDIOVERSION N/A 09/19/2019   Procedure: CARDIOVERSION;  Surgeon: Lonni Slain, MD;  Location: [facility] ENDOSCOPY;  Service: Cardiovascular;  Laterality: N/A;   COLONOSCOPY     COLONOSCOPY WITH PROPOFOL  Left 04/11/2023   Procedure: COLONOSCOPY WITH PROPOFOL ;  Surgeon: Legrand Victory CROME DOUGLAS, MD;  Location: [facility] ENDOSCOPY;  Service: Gastroenterology;  Laterality: Left;   COMBINED AUGMENTATION MAMMAPLASTY AND ABDOMINOPLASTY  2009   W/  BILATERAL  THIGH LIFT   CYSTO/  RIGHT  URETERAL STENT PLACEMENT  12/30/2010   CYSTOSCOPY WITH RETROGRADE PYELOGRAM, URETEROSCOPY AND STENT PLACEMENT Right 10/07/2013   Procedure: CYSTOSCOPY WITH RETROGRADE PYELOGRAM, right URETEROSCOPY AND STENT PLACEMENT, stone extraction;  Surgeon: Oliva VEAR Oiler, MD;  Location: [facility];  Service: Urology;  Laterality: Right;   CYSTOSCOPY WITH STENT PLACEMENT Right 06/04/2013   Procedure: CYSTOSCOPY WITH STENT PLACEMENT;  Surgeon: Garnette Shack, MD;  Location: [facility]  ORS;  Service: Urology;  Laterality: Right;   DILATATION & CURETTAGE/HYSTEROSCOPY WITH MYOSURE N/A 04/27/2015   Procedure: DILATATION & CURETTAGE/HYSTEROSCOPY WITH MYOSURE;  Surgeon: Curlee VEAR Guan, MD;  Location: [facility] ORS;  Service: Gynecology;  Laterality: N/A;   ENDARTERECTOMY FEMORAL Left 06/08/2014   Procedure: Left Leg Common Femoral and External Iliac  Endartarectomy with patch Angioplasty;  Surgeon: Krystal JULIANNA Doing, MD;  Location: [facility] OR;  Service: Vascular;  Laterality: Left;   ENTEROSCOPY N/A 04/09/2023   Procedure: ENTEROSCOPY;  Surgeon: Legrand Victory LITTIE DOUGLAS, MD;  Location: [facility] ENDOSCOPY;  Service: Gastroenterology;  Laterality: N/A;   ESOPHAGOGASTRODUODENOSCOPY N/A 10/14/2018   Procedure: ESOPHAGOGASTRODUODENOSCOPY (EGD);  Surgeon: Wilhelmenia Aloha Raddle., MD;  Location: [facility] ENDOSCOPY;  Service: Gastroenterology;  Laterality: N/A;   ESOPHAGOGASTRODUODENOSCOPY (EGD) WITH PROPOFOL  N/A 11/06/2018   Procedure: ESOPHAGOGASTRODUODENOSCOPY (EGD) WITH PROPOFOL ;  Surgeon: Wilhelmenia Aloha Raddle., MD;  Location: [facility] ENDOSCOPY;  Service: Gastroenterology;  Laterality: N/A;  RFA   ESOPHAGOGASTRODUODENOSCOPY (EGD) WITH PROPOFOL  N/A 02/12/2019   Procedure: ESOPHAGOGASTRODUODENOSCOPY (EGD) WITH PROPOFOL ;  Surgeon: Wilhelmenia Aloha Raddle., MD;  Location: [facility] ENDOSCOPY;  Service: Gastroenterology;  Laterality: N/A;   ESOPHAGOGASTRODUODENOSCOPY (EGD) WITH PROPOFOL  N/A 08/04/2019   Procedure: ESOPHAGOGASTRODUODENOSCOPY (EGD) WITH PROPOFOL ;  Surgeon: Wilhelmenia Aloha Raddle., MD;  Location: [facility] ENDOSCOPY;  Service: Gastroenterology;  Laterality: N/A;  WITH RFA   EXTRACORPOREAL SHOCK WAVE LITHOTRIPSY Right 08-04-2013//   06-23-2013//   01-16-2011   EYE SURGERY Bilateral    cataract   FEMORAL-POPLITEAL BYPASS GRAFT Left 06/08/2014   Procedure: Left Leg Femoral -Popliteal Bypass Graft;  Surgeon: Krystal JULIANNA Doing, MD;  Location: [facility] OR;  Service: Vascular;  Laterality: Left;   FEMORAL-POPLITEAL BYPASS GRAFT Left 06/09/2014    Procedure: Left Femoral and Popliteal Exposure; Left Femoral to Anterior Tibial Bypass Graft using Propaten 6mm by 80cm Goretex Graft; Left Tibial Endarterectomy; Left Femoraland Popliteal Thrombectomy ;  Surgeon: Gaile LELON New, MD;  Location: [facility] OR;  Service: Vascular;  Laterality: Left;   FEMORAL-TIBIAL BYPASS GRAFT Right 03/09/2023   Procedure: RIGHT FEMORAL-POSTERIOR TIBIAL ARTERY BYPASS USING X 80CM PROPATEN GRAFT;  Surgeon: New Gaile LELON, MD;  Location: [facility] OR;  Service: Vascular;  Laterality: Right;   GI RADIOFREQUENCY ABLATION N/A 11/06/2018   Procedure: GI RADIOFREQUENCY ABLATION;  Surgeon: Wilhelmenia Aloha Raddle., MD;  Location: [facility] ENDOSCOPY;  Service: Gastroenterology;  Laterality: N/A;   GI RADIOFREQUENCY ABLATION N/A 02/12/2019   Procedure: GI RADIOFREQUENCY ABLATION;  Surgeon: Wilhelmenia Aloha Raddle., MD;  Location: [facility] ENDOSCOPY;  Service: Gastroenterology;  Laterality: N/A;   GI RADIOFREQUENCY ABLATION N/A 08/04/2019   Procedure: GI RADIOFREQUENCY ABLATION;  Surgeon: Wilhelmenia Aloha Raddle., MD;  Location: [facility] ENDOSCOPY;  Service: Gastroenterology;  Laterality: N/A;   GIVENS CAPSULE STUDY N/A 04/09/2023   Procedure: GIVENS CAPSULE STUDY;  Surgeon: Legrand Victory LITTIE DOUGLAS, MD;  Location: [facility] ENDOSCOPY;  Service: Gastroenterology;  Laterality: N/A;   HOLMIUM LASER APPLICATION Right 10/07/2013   Procedure: HOLMIUM LASER APPLICATION;  Surgeon: Oliva VEAR Oiler, MD;  Location: [facility];  Service: Urology;  Laterality: Right;   KIDNEY STONE SURGERY  07/2013   1-2 stones   LAPAROSCOPIC GASTRIC BANDING  05/29/2005   LITHOTRIPSY  2-3 times   LOWER EXTREMITY ANGIOGRAM N/A 06/04/2014   Procedure: LOWER EXTREMITY ANGIOGRAM;  Surgeon: Gaile LELON New, MD;  Location: [facility] CATH LAB;  Service: Cardiovascular;  Laterality: N/A;   ORIF FIFTH METACARPAL FX  RIGHT HAND  04/21/2002   PATCH ANGIOPLASTY Right 03/09/2023   Procedure: VEIN PATCH ANGIOPLASTY TO RIGHT FEMORAL ARTERY;   Surgeon: New Gaile LELON, MD;  Location: [facility] OR;  Service: Vascular;  Laterality: Right;   POLYPECTOMY     REVISION AND RE-SITING LAP-BAND PORT  04/08/2010   W/  UPPER EGD   RIGHT KNEE PATELLECTOMY W/ REPAIR OF EXTENSOR MECHANISM  04/14/2002   TEE WITHOUT CARDIOVERSION N/A 12/24/2019   Procedure: TRANSESOPHAGEAL ECHOCARDIOGRAM (TEE);  Surgeon: Shlomo Wilbert SAUNDERS, MD;  Location: [facility] ENDOSCOPY;  Service: Cardiovascular;  Laterality: N/A;   Toenail removed Left 05/21/2014   2nd toenail-  Dr. Christine    Current Outpatient Medications  Medication Sig Dispense Refill   aspirin  EC 81 MG tablet Take 1 tablet (81 mg total) by mouth daily. Swallow whole. (Patient taking differently: Take 325 mg by mouth daily. Swallow whole.)     bisacodyl  (DULCOLAX) 5 MG EC tablet Take 5 mg by mouth daily as needed for moderate constipation. (Patient taking differently: Take 5 mg by mouth as needed for moderate constipation.)     Blood Glucose Monitoring Suppl (ONETOUCH VERIO IQ SYSTEM) w/Device KIT USE TO CHECK SUGAR DAILY 1 kit 0   diltiazem  (CARDIZEM  CD) 120 MG 24 hr capsule Take 1 capsule (120 mg total) by mouth daily. 90 capsule 0   diltiazem  (CARDIZEM ) 30 MG tablet Take 30 mg by mouth every 4 (four) hours as needed.     Docusate Sodium  (COLACE PO) Take 1 tablet by mouth daily as needed. (Patient taking differently: Take 1 tablet by mouth as needed.)     dorzolamide -timolol  (COSOPT ) 22.3-6.8 MG/ML ophthalmic solution Place 1 drop into the right eye 2 (two) times daily.      ezetimibe  (ZETIA ) 10 MG tablet Take 1 tablet by mouth once daily 90 tablet 0   hydrALAZINE  (APRESOLINE ) 100 MG tablet Take 1 tablet (100 mg total) by mouth in the morning and at bedtime.     Insulin  Glargine (BASAGLAR  KWIKPEN) 100 UNIT/ML DIAL  AND INJECT 46 UNITS UNDER THE SKIN DAILY. NEEDS VISIT WITH DR. JOSHUA FOR REFILLS. 15 mL 0   insulin  lispro (HUMALOG  KWIKPEN) 100 UNIT/ML KwikPen Inject 14 Units into the skin with breakfast, with lunch, and  with evening meal. DIAL  14 UNITS AND INJECT UNDER THE SKIN THREE TIMES A DAY. 45 mL 0   Insulin  Pen Needle (PEN NEEDLES) 32G X 4 MM MISC Inject 1 pen  as directed 4 (four) times daily. Use to inject levemir  or novolog  as directed. 400 each 3   latanoprost  (XALATAN ) 0.005 % ophthalmic solution Place 1 drop into both eyes at bedtime.      losartan  (COZAAR ) 25 MG tablet Take 1 tablet (25 mg total) by mouth daily. NEEDS APPOINTMENT WITH DR. JOSHUA FOR REFILLS (Patient taking differently: Take 100 mg by mouth daily. NEEDS APPOINTMENT WITH DR. JOSHUA FOR REFILLS) 30 tablet 0   MAGNESIUM -OXIDE 400 (240 Mg) MG tablet Take 1 tablet by mouth once daily 90 tablet 0   nebivolol  (BYSTOLIC ) 10 MG tablet Take 1 tablet by mouth once daily 90 tablet 3   ONETOUCH VERIO test strip USE TO check blood glucose TWICE DAILY  200 strip 3   pantoprazole  (PROTONIX ) 40 MG tablet Take 1 tablet by mouth twice daily 180 tablet 0   rosuvastatin  (CRESTOR ) 5 MG tablet Take 1 tablet by mouth once daily 90 tablet 1   Semaglutide , 1 MG/DOSE, (OZEMPIC , 1 MG/DOSE,) 4 MG/3ML SOPN Inject 1 mg into the skin once a week. Via [employer] patient assistance 9 mL 1   SYNJARDY  5-500 MG TABS TAKE 1 TABLET BY MOUTH TWICE DAILY 180 tablet 1   XARELTO  20 MG TABS tablet TAKE 1 TABLET BY MOUTH ONCE DAILY . APPOINTMENT REQUIRED FOR FUTURE REFILLS WITH  DR.  JOSHUA. 30 tablet 0   Ferric Maltol  (ACCRUFER ) 30 MG CAPS Take 1 capsule (30 mg total) by mouth in the morning and at bedtime. (Patient not taking: Reported on 10/23/2023) 180 capsule 0   No current facility-administered medications for this encounter.    Allergies  Allergen Reactions   Amlodipine  Swelling   Atorvastatin  Other (See Comments)    Muscle aches    Ampicillin  Nausea And Vomiting and Other (See Comments)   Codeine Nausea And Vomiting    Ask patient   Lisinopril Cough        Penicillins Nausea And Vomiting    Did it involve swelling of the face/tongue/throat, SOB, or low BP? No Did  it involve sudden or severe rash/hives, skin peeling, or any reaction on the inside of your mouth or nose? No Did you need to seek medical attention at a hospital or doctor's office? No When did it last happen?      20+ years If all above answers are NO, may proceed with cephalosporin use.    Januvia  [Sitagliptin ] Nausea And Vomiting   ROS- All systems are reviewed and negative except as per the HPI above  Physical Exam: Vitals:   10/23/23 1022  BP: 112/80  Pulse: (!) 132  Weight: 81.9 kg  Height: 5' 5 (1.651 m)     Wt Readings from Last 3 Encounters:  10/23/23 81.9 kg  10/15/23 79.4 kg  10/12/23 79.7 kg    Labs: Lab Results  Component Value Date   NA 141 10/12/2023   K 4.4 10/12/2023   CL 106 10/12/2023   CO2 24 10/12/2023   GLUCOSE 139 (H) 10/12/2023   BUN 20 10/12/2023   CREATININE 0.94 10/12/2023   CALCIUM  9.5 10/12/2023   MG 1.6 (L) 04/11/2023   Lab Results  Component Value Date   INR 1.0 03/09/2023   Lab Results  Component Value Date   CHOL 118 10/08/2023   HDL 46.60 10/08/2023   LDLCALC 38 10/08/2023   TRIG 164.0 (H) 10/08/2023   GEN- The patient is well appearing, alert and oriented x 3 today.   Neck - no JVD or carotid bruit noted Lungs- Clear to ausculation bilaterally, normal work of breathing Heart- Regular tachycardic rate and rhythm, no murmurs, rubs or gallops, PMI not laterally displaced Extremities- no clubbing, cyanosis, or edema Skin - no rash or ecchymosis noted   EKG today demonstrates Vent. rate 132 BPM PR interval * ms QRS duration 92 ms QT/QTcB 340/503 ms P-R-T axes * 46 96 Atrial flutter with RVR Possible Inferior infarct , age undetermined Possible Anterior infarct , age undetermined Abnormal ECG When compared with ECG of 30-Jul-2023 09:07, Atrial flutter has replaced sinus rhythm Confirmed by Terra Pac ([id number]) on 10/23/2023 10:59:30 AM  Echo 09/26/23:  1. Left ventricular ejection fraction, by estimation, is 60 to  65%. The  left ventricle has normal function.  Left ventricular endocardial border  not optimally defined to evaluate regional wall motion. Left ventricular  diastolic parameters are  indeterminate.   2. Right ventricular systolic function is normal. The right ventricular  size is mildly enlarged.   3. Left atrial size was severely dilated.   4. 2D Planimetry of 2.34 cm2. Continuity equation MVA 1.32 cm . The  mitral valve is degenerative. No evidence of mitral valve regurgitation.  Mild to moderate mitral stenosis. The mean mitral valve gradient is 5.0  mmHg with average heart rate of 61 bpm.   Severe mitral annular calcification.   5. The aortic valve is tricuspid. There is mild calcification of the  aortic valve. Aortic valve regurgitation is not visualized. Aortic valve  sclerosis is present, with no evidence of aortic valve stenosis.   6. Dilated pulmonary artery.   7. The inferior vena cava is normal in size with greater than 50%  respiratory variability, suggesting right atrial pressure of 3 mmHg.    CHA2DS2-VASc Score = 4  The patient's score is based upon: CHF History: 0 HTN History: 1 Diabetes History: 0 Stroke History: 0 Vascular Disease History: 1 Age Score: 1 Gender Score: 1       ASSESSMENT AND PLAN: 1. Persistent Atrial Fibrillation/atrial flutter The patient's CHA2DS2-VASc score is 4, indicating a 4.8% annual risk of stroke.   S/p afib ablation 08/02/21 by Dr. Inocencio.  She is currently in atrial flutter with RVR. We discussed the procedure cardioversion to try to convert to NSR. We discussed the risks vs benefits of this procedure and how ultimately we cannot predict whether a patient will have early return of arrhythmia post procedure. After discussion, the patient wishes to proceed with cardioversion. Labs drawn on 6/13. For improved rate control, will decrease losartan  to 50 mg daily and increase diltiazem  to 240 mg daily. She go back to original doses after  cardioversion.  Informed Consent   Shared Decision Making/Informed Consent The risks (stroke, cardiac arrhythmias rarely resulting in the need for a temporary or permanent pacemaker, skin irritation or burns and complications associated with conscious sedation including aspiration, arrhythmia, respiratory failure and death), benefits (restoration of normal sinus rhythm) and alternatives of a direct current cardioversion were explained in detail to Ms. Dasie and she agrees to proceed.       2. Secondary Hypercoagulable State (ICD10:  D68.69) The patient is at significant risk for stroke/thromboembolism based upon her CHA2DS2-VASc Score of 4.  Continue Rivaroxaban  (Xarelto ).  No missed doses.   3. HTN Stable today.  4. CAD CAC score on CT 704 On statin No chest pain.  5. PAD S/p fem pop bypass 2016 Followed by [facility]  6. VHD Mild-moderate MS Followed by Dr Jeffrie    Follow up 2 weeks after DCCV.    Dorn Heinrich, PA-C Af[facility] [address] [place], [place] [address] [phone]

## 2023-10-24 ENCOUNTER — Ambulatory Visit (HOSPITAL_COMMUNITY): Admitting: Internal Medicine

## 2023-10-24 ENCOUNTER — Other Ambulatory Visit: Payer: Self-pay | Admitting: Internal Medicine

## 2023-10-24 ENCOUNTER — Telehealth: Payer: Self-pay

## 2023-10-24 ENCOUNTER — Other Ambulatory Visit (INDEPENDENT_AMBULATORY_CARE_PROVIDER_SITE_OTHER): Admitting: Pharmacist

## 2023-10-24 DIAGNOSIS — E119 Type 2 diabetes mellitus without complications: Secondary | ICD-10-CM

## 2023-10-24 DIAGNOSIS — Z794 Long term (current) use of insulin: Secondary | ICD-10-CM

## 2023-10-24 DIAGNOSIS — Z1231 Encounter for screening mammogram for malignant neoplasm of breast: Secondary | ICD-10-CM

## 2023-10-24 DIAGNOSIS — I10 Essential (primary) hypertension: Secondary | ICD-10-CM

## 2023-10-24 MED ORDER — FREESTYLE LIBRE 3 PLUS SENSOR MISC: 5 refills | Status: DC

## 2023-10-24 MED ORDER — BASAGLAR KWIKPEN 100 UNIT/ML ~~LOC~~ SOPN: PEN_INJECTOR | SUBCUTANEOUS | 5 refills | Status: DC

## 2023-10-24 MED ORDER — TORSEMIDE 10 MG PO TABS: 10.0000 mg | ORAL_TABLET | Freq: Every day | ORAL | 0 refills | Status: DC

## 2023-10-24 NOTE — Progress Notes (Signed)
 10/24/2023 Name: Theresa Harrington MRN: [id number] DOB: 1955-06-13  Chief Complaint  Patient presents with   Diabetes   Medication Management    Theresa Harrington is a 68 y.o. year old female who presented for a telephone visit.   They were referred to the pharmacist by their PCP for assistance in managing diabetes.   Subjective:  Care Team: Primary Care Provider: Joshua Debby CROME, MD ; Next Scheduled Visit: 7/9  Medication Access/Adherence  Current Pharmacy:  [facility] [address] [place] - [address] [facility] [place] [address] Phone: [phone] Fax: [phone]  [facility] [place], [place] - [address] [place] [address] Phone: [phone] Fax: [phone]  [name] [address] [place] [address] Phone: [phone] Fax: [phone]  [facility] [place], [place] - [address] [phone] [place] [address] Phone: [phone] Fax: [phone]  [facility] - [place], [place] - [address] [place] [address] Phone: [phone] Fax: [phone]   Patient reports affordability concerns with their medications: No  Patient reports access/transportation concerns to their pharmacy: No  Patient reports adherence concerns with their medications:  No     Diabetes:  Current medications: Basaglar  50 units daily, Humalog  14 units TID AC, Ozempic  1 mg weekly, Synjardy  5/500 mg BID   Current glucose readings: BG 125-140 in the AM Using glucometer; testing 1 time per day She used CGM at one point but notes insurance stopped covering it She is willing to use CGM again if insurance covers it  Patient denies hypoglycemic s/sx including dizziness, shakiness, sweating. Patient denies hyperglycemic symptoms including polyuria, polydipsia, polyphagia, nocturia, neuropathy, blurred vision.  Current  meal patterns:  She tends to eat around 8 PM and then lays down at 9 PM  Current physical activity: some walking but has been taking it easy lately due to afib issues  Current medication access support: PAP through [facility], [facility], and [facility]   Objective:  Lab Results  Component Value Date   HGBA1C 8.9 (H) 10/12/2023    Lab Results  Component Value Date   CREATININE 0.94 10/12/2023   BUN 20 10/12/2023   NA 141 10/12/2023   K 4.4 10/12/2023   CL 106 10/12/2023   CO2 24 10/12/2023    Lab Results  Component Value Date   CHOL 118 10/08/2023   HDL 46.60 10/08/2023   LDLCALC 38 10/08/2023   LDLDIRECT 160.2 03/01/2010   TRIG 164.0 (H) 10/08/2023   CHOLHDL 3 10/08/2023    Medications Reviewed Today     Reviewed by [name], RPH (Pharmacist) on 10/24/23 at 1357  Med List Status: <None>   Medication Order Taking? Sig Documenting Provider Last Dose Status Informant  aspirin  EC 81 MG tablet [id number]  Take 1 tablet (81 mg total) by mouth daily. Swallow whole.  Patient taking differently: Take 325 mg by mouth daily. Swallow whole.   Bethanie Cough, PA-C  Active   bisacodyl  (DULCOLAX) 5 MG EC tablet [id number]  Take 5 mg by mouth daily as needed for moderate constipation.  Patient taking differently: Take 5 mg by mouth as needed for moderate constipation.   [provider]  Active Self, Pharmacy Records  Blood Glucose Monitoring Suppl [facility] VERIO IQ SYSTEM) w/Device KIT [id number]  USE TO CHECK SUGAR DAILY Joshua Debby CROME, MD  Active Self, Pharmacy Records  Continuous Glucose Sensor (FREESTYLE LIBRE 3 PLUS SENSOR) MISC [id number] Yes Change sensor every 15 days. Joshua Debby CROME, MD  Active   diltiazem  (CARDIZEM  CD) 120 MG 24 hr capsule [id number]  Take 1 capsule (120 mg total) by mouth daily. Joshua Debby CROME, MD  Active   diltiazem  (CARDIZEM ) 30 MG tablet [id number]  Take 30 mg by mouth every 4 (four) hours as needed. [provider]  Active   Docusate  Sodium (COLACE PO) [id number]  Take 1 tablet by mouth daily as needed.  Patient taking differently: Take 1 tablet by mouth as needed.   [provider]  Active Self, Pharmacy Records  dorzolamide -timolol  (COSOPT ) 22.3-6.8 MG/ML ophthalmic solution [id number]  Place 1 drop into the right eye 2 (two) times daily.  [provider]  Active Self, Pharmacy Records           Med Note [name]   Fri Apr 06, 2023  7:26 PM)    ezetimibe  (ZETIA ) 10 MG tablet [id number]  Take 1 tablet by mouth once daily Joshua Debby CROME, MD  Active   hydrALAZINE  (APRESOLINE ) 100 MG tablet [id number]  Take 1 tablet (100 mg total) by mouth in the morning and at bedtime. Elnor Lauraine BRAVO, NP  Active   Insulin  Glargine (BASAGLAR  [facility]) 100 UNIT/ML [id number] Yes DIAL  AND INJECT 46 UNITS UNDER THE SKIN DAILY. NEEDS VISIT WITH DR. JOSHUA FOR REFILLS. Joshua Debby CROME, MD  Active   insulin  lispro (HUMALOG  KWIKPEN) 100 UNIT/ML KwikPen [id number] Yes Inject 14 Units into the skin with breakfast, with lunch, and with evening meal. DIAL  14 UNITS AND INJECT UNDER THE SKIN THREE TIMES A DAY. Joshua Debby CROME, MD  Active   Insulin  Pen Needle (PEN NEEDLES) 32G X 4 MM MISC [id number]  Inject 1 pen  as directed 4 (four) times daily. Use to inject levemir  or novolog  as directed. Joshua Debby CROME, MD  Active Self, Pharmacy Records  latanoprost  (XALATAN ) 0.005 % ophthalmic solution [id number]  Place 1 drop into both eyes at bedtime.  [provider]  Active Self, Pharmacy Records           Med Note [name]   Fri Apr 06, 2023  7:20 PM)    losartan  (COZAAR ) 25 MG tablet [id number]  Take 1 tablet (25 mg total) by mouth daily. NEEDS APPOINTMENT WITH DR. JOSHUA FOR REFILLS  Patient taking differently: Take 100 mg by mouth daily. NEEDS APPOINTMENT WITH DR. JOSHUA FOR REFILLS   Joshua Debby CROME, MD  Active   MAGNESIUM -OXIDE 400 (240 Mg) MG tablet [id number]  Take 1 tablet by mouth once daily Joshua Debby CROME, MD   Active   nebivolol  (BYSTOLIC ) 10 MG tablet [id number]  Take 1 tablet by mouth once daily Jeffrie Oneil BROCKS, MD  Active   [facility] VERIO test strip [id number]  USE TO check blood glucose TWICE DAILY Joshua Debby CROME, MD  Active Self, Pharmacy Records  pantoprazole  (PROTONIX ) 40 MG tablet [id number]  Take 1 tablet by mouth twice daily Joshua Debby CROME, MD  Active   rosuvastatin  (CRESTOR ) 5 MG tablet [id number]  Take 1 tablet by mouth once daily Jeffrie Oneil BROCKS, MD  Active   Semaglutide , 1 MG/DOSE, (OZEMPIC , 1 MG/DOSE,) 4 MG/3ML SOPN [id number] Yes Inject 1 mg into the skin once a week. Via [employer] patient assistance Joshua Debby CROME, MD  Active Self, Pharmacy Records           Med Note [place], TYELISHA L  Fri Apr 06, 2023  7:28 PM) On Sundays  SYNJARDY  5-500 MG TABS [id number] Yes TAKE 1 TABLET BY MOUTH TWICE DAILY Joshua Debby CROME, MD  Active   XARELTO  20 MG TABS tablet [id number]  TAKE 1 TABLET BY MOUTH ONCE DAILY . APPOINTMENT REQUIRED FOR FUTURE REFILLS WITH  DR.  JOSHUA. Joshua Debby CROME, MD  Active               Assessment/Plan:   Diabetes: - Currently uncontrolled, A1c goal <7% - Reviewed dietary modifications including balanced meals, increased protein/fiber - Reviewed lifestyle modifications including: increased walking and strength training - Recommend to continue current regimen and starting using CGM to get better idea of BG trends to better adjust medications - Sent Rx for [facility] 3 Plus sensors.   - Asked tech to check on PAP status for Basaglar  and Ozempic    Message sent to provider regarding IV iron order form and also pt's concern regarding increased lower extremity edema.  Follow Up Plan: 7/10  Darrelyn Drum, PharmD, BCPS, CPP Clinical Pharmacist Practitioner [facility] [phone]

## 2023-10-24 NOTE — Telephone Encounter (Signed)
 [employer] a call a request from [facility] pt has not received refill from [employer] , spoke with representative said pt needs a new rx can be fax to [phone]. also call Novo Nordisk on Ozempic  to check when his next fill is due per representative  med has been mail out to provider office and will be receiving it with in 7-10 days.

## 2023-10-24 NOTE — Patient Instructions (Signed)
 It was a pleasure speaking with you today!  I have sent the prescription for Freestyle Libre 3 Plus sensors. Check on the compatibility of using the [facility] 3 app on your phone to see if I need to send the prescription for the reader or not.  Feel free to call with any questions or concerns!  Darrelyn Drum, PharmD, BCPS, CPP Clinical Pharmacist Practitioner [facility] [phone]

## 2023-10-26 NOTE — Telephone Encounter (Signed)
 Unable to reach patient. LMTRC

## 2023-10-26 NOTE — Telephone Encounter (Signed)
 Spoke with patient and gave her the information to the iron infusion center

## 2023-10-28 NOTE — Pre-Procedure Instructions (Signed)
 Attempted to call patient regarding procedure scheduled on 11/05/23 with anesthesia.  Anesthesia requires Ozempic  to be held for 7 days prior to procedure.  EPIC says she takes on Sundays.  Left voicemail that she can take dose today, but don't take next Sunday.  Today would be the last day can take prior to procedure.

## 2023-10-29 ENCOUNTER — Ambulatory Visit

## 2023-10-29 ENCOUNTER — Other Ambulatory Visit (INDEPENDENT_AMBULATORY_CARE_PROVIDER_SITE_OTHER)

## 2023-10-29 DIAGNOSIS — D509 Iron deficiency anemia, unspecified: Secondary | ICD-10-CM | POA: Diagnosis not present

## 2023-10-30 LAB — HEMOCCULT SLIDES (X 3 CARDS)
Fecal Occult Blood: NEGATIVE
OCCULT 1: NEGATIVE
OCCULT 2: NEGATIVE
OCCULT 3: NEGATIVE
OCCULT 4: NEGATIVE
OCCULT 5: NEGATIVE

## 2023-11-01 NOTE — Progress Notes (Signed)
 Spoke to patient and instructed them to come at 730  and to be NPO after 0000.  Medications reviewed.    Confirmed that patient will have a ride home and someone to stay with them for 24 hours after the procedure.

## 2023-11-02 DIAGNOSIS — M549 Dorsalgia, unspecified: Secondary | ICD-10-CM | POA: Diagnosis not present

## 2023-11-04 NOTE — Anesthesia Preprocedure Evaluation (Signed)
 Anesthesia Evaluation  Patient identified by MRN, date of birth, ID band Patient awake    Reviewed: Allergy & Precautions, H&P , NPO status , Patient's Chart, lab work & pertinent test results  History of Anesthesia Complications (+) PONV and history of anesthetic complications  Airway Mallampati: II   Neck ROM: full    Dental no notable dental hx.    Pulmonary neg COPD, former smoker   breath sounds clear to auscultation       Cardiovascular hypertension, + Peripheral Vascular Disease  + dysrhythmias Atrial Fibrillation  Rhythm:regular Rate:Normal  TTE: IMPRESSIONS     1. Left ventricular ejection fraction, by estimation, is 60 to 65%. The  left ventricle has normal function. Left ventricular endocardial border  not optimally defined to evaluate regional wall motion. Left ventricular  diastolic parameters are  indeterminate.   2. Right ventricular systolic function is normal. The right ventricular  size is mildly enlarged.   3. Left atrial size was severely dilated.   4. 2D Planimetry of 2.34 cm2. Continuity equation MVA 1.32 cm . The  mitral valve is degenerative. No evidence of mitral valve regurgitation.  Mild to moderate mitral stenosis. The mean mitral valve gradient is 5.0  mmHg with average heart rate of 61 bpm.   Severe mitral annular calcification.   5. The aortic valve is tricuspid. There is mild calcification of the  aortic valve. Aortic valve regurgitation is not visualized. Aortic valve  sclerosis is present, with no evidence of aortic valve stenosis.   6. Dilated pulmonary artery.   7. The inferior vena cava is normal in size with greater than 50%  respiratory variability, suggesting right atrial pressure of 3 mmHg.     Neuro/Psych  PSYCHIATRIC DISORDERS Anxiety Depression       GI/Hepatic ,GERD  ,,  Endo/Other  diabetes, Type 2Hypothyroidism    Renal/GU Renal disease     Musculoskeletal  (+)  Arthritis ,    Abdominal   Peds  Hematology  (+) Blood dyscrasia, anemia   Anesthesia Other Findings Easy DL on last GETA  Reproductive/Obstetrics                              Anesthesia Physical Anesthesia Plan  ASA: 3  Anesthesia Plan: General   Post-op Pain Management:    Induction: Intravenous  PONV Risk Score and Plan: 3 and Treatment may vary due to age or medical condition  Airway Management Planned: Simple Face Mask and Natural Airway  Additional Equipment: None  Intra-op Plan:   Post-operative Plan:   Informed Consent: I have reviewed the patients History and Physical, chart, labs and discussed the procedure including the risks, benefits and alternatives for the proposed anesthesia with the patient or authorized representative who has indicated his/her understanding and acceptance.     Dental advisory given  Plan Discussed with: CRNA and Anesthesiologist  Anesthesia Plan Comments:         Anesthesia Quick Evaluation

## 2023-11-05 ENCOUNTER — Other Ambulatory Visit: Payer: Self-pay

## 2023-11-05 ENCOUNTER — Encounter (HOSPITAL_COMMUNITY): Payer: Self-pay

## 2023-11-05 ENCOUNTER — Ambulatory Visit

## 2023-11-05 ENCOUNTER — Encounter (HOSPITAL_COMMUNITY)
Admission: RE | Disposition: A | Discharge status: Home / Self Care | Payer: Self-pay | Source: Home / Self Care | Attending: Internal Medicine

## 2023-11-05 ENCOUNTER — Ambulatory Visit (HOSPITAL_BASED_OUTPATIENT_CLINIC_OR_DEPARTMENT_OTHER): Payer: Self-pay

## 2023-11-05 ENCOUNTER — Encounter (HOSPITAL_COMMUNITY): Payer: Self-pay | Admitting: Internal Medicine

## 2023-11-05 ENCOUNTER — Ambulatory Visit (HOSPITAL_COMMUNITY)
Admission: RE | Admit: 2023-11-05 | Discharge: 2023-11-05 | Disposition: A | Discharge status: Home / Self Care | Attending: Internal Medicine | Admitting: Internal Medicine

## 2023-11-05 DIAGNOSIS — Z79899 Other long term (current) drug therapy: Secondary | ICD-10-CM | POA: Diagnosis not present

## 2023-11-05 DIAGNOSIS — Z794 Long term (current) use of insulin: Secondary | ICD-10-CM | POA: Insufficient documentation

## 2023-11-05 DIAGNOSIS — D6869 Other thrombophilia: Secondary | ICD-10-CM | POA: Insufficient documentation

## 2023-11-05 DIAGNOSIS — Z7985 Long-term (current) use of injectable non-insulin antidiabetic drugs: Secondary | ICD-10-CM | POA: Diagnosis not present

## 2023-11-05 DIAGNOSIS — I4892 Unspecified atrial flutter: Secondary | ICD-10-CM | POA: Diagnosis not present

## 2023-11-05 DIAGNOSIS — Z7901 Long term (current) use of anticoagulants: Secondary | ICD-10-CM | POA: Insufficient documentation

## 2023-11-05 DIAGNOSIS — I10 Essential (primary) hypertension: Secondary | ICD-10-CM

## 2023-11-05 DIAGNOSIS — E039 Hypothyroidism, unspecified: Secondary | ICD-10-CM | POA: Diagnosis not present

## 2023-11-05 DIAGNOSIS — I4891 Unspecified atrial fibrillation: Secondary | ICD-10-CM

## 2023-11-05 DIAGNOSIS — I4819 Other persistent atrial fibrillation: Secondary | ICD-10-CM | POA: Insufficient documentation

## 2023-11-05 DIAGNOSIS — E1151 Type 2 diabetes mellitus with diabetic peripheral angiopathy without gangrene: Secondary | ICD-10-CM | POA: Insufficient documentation

## 2023-11-05 DIAGNOSIS — E1122 Type 2 diabetes mellitus with diabetic chronic kidney disease: Secondary | ICD-10-CM | POA: Diagnosis not present

## 2023-11-05 DIAGNOSIS — I2510 Atherosclerotic heart disease of native coronary artery without angina pectoris: Secondary | ICD-10-CM | POA: Insufficient documentation

## 2023-11-05 DIAGNOSIS — K219 Gastro-esophageal reflux disease without esophagitis: Secondary | ICD-10-CM | POA: Diagnosis not present

## 2023-11-05 DIAGNOSIS — I129 Hypertensive chronic kidney disease with stage 1 through stage 4 chronic kidney disease, or unspecified chronic kidney disease: Secondary | ICD-10-CM | POA: Diagnosis not present

## 2023-11-05 DIAGNOSIS — N182 Chronic kidney disease, stage 2 (mild): Secondary | ICD-10-CM | POA: Diagnosis not present

## 2023-11-05 DIAGNOSIS — Z87891 Personal history of nicotine dependence: Secondary | ICD-10-CM | POA: Diagnosis not present

## 2023-11-05 HISTORY — PX: CARDIOVERSION: EP1203

## 2023-11-05 SURGERY — CARDIOVERSION (CATH LAB)
Anesthesia: General

## 2023-11-05 MED ORDER — SODIUM CHLORIDE 0.9% FLUSH: 3.0000 mL | INTRAVENOUS | Status: DC | PRN

## 2023-11-05 MED ORDER — LIDOCAINE 2% (20 MG/ML) 5 ML SYRINGE
INTRAMUSCULAR | Status: DC | PRN
  Administered 2023-11-05: 60 mg via INTRAVENOUS

## 2023-11-05 MED ORDER — PROPOFOL 10 MG/ML IV BOLUS
INTRAVENOUS | Status: DC | PRN
  Administered 2023-11-05: 60 mg via INTRAVENOUS

## 2023-11-05 MED ORDER — SODIUM CHLORIDE 0.9% FLUSH: 3.0000 mL | Freq: Two times a day (BID) | INTRAVENOUS | Status: DC

## 2023-11-05 SURGICAL SUPPLY — 1 items: PAD DEFIB RADIO PHYSIO CONN (PAD) ×2 IMPLANT

## 2023-11-05 NOTE — CV Procedure (Signed)
    CARDIOVERSION NOTE  Procedure: Electrical Cardioversion Indications:  Atrial Flutter  Procedure Details:  Consent: Risks of procedure as well as the alternatives and risks of each were explained to the (patient/caregiver).  Consent for procedure obtained.  Time Out: Verified patient identification, verified procedure, site/side was marked, verified correct patient position, special equipment/implants available, medications/allergies/relevent history reviewed, required imaging and test results available.  Performed  Patient placed on cardiac monitor, pulse oximetry, supplemental oxygen  as necessary.  Sedation given: propofol  per anesthesia Pacer pads placed anterior and posterior chest.  Cardioverted 1 time(s).  Cardioverted at 150J biphasic.  Impression: Findings: Post procedure EKG shows: NSR Complications: None Patient did tolerate procedure well.  Plan: Successful DCCV with a single 150J biphasic shock to NSR.  Time Spent Directly with the Patient:  30 minutes   Vinie KYM Maxcy, MD, [facility], FNLA, FACP  [place]  [facility]  Medical Director of the [facility]  [facility]ovascular [facility] Reduction [facility] Diplomate of the American Board of Clinical Lipidology Attending Cardiologist  Direct Dial : [phone]  Fax: [phone]  Website:  [email or web address] Vinie BROCKS Adnan Vanvoorhis 11/05/2023, 9:03 AM

## 2023-11-05 NOTE — Anesthesia Postprocedure Evaluation (Signed)
 Anesthesia Post Note  Patient: Theresa Harrington  Procedure(s) Performed: CARDIOVERSION     Patient location during evaluation: PACU Anesthesia Type: General Level of consciousness: awake and alert Pain management: pain level controlled Vital Signs Assessment: post-procedure vital signs reviewed and stable Respiratory status: spontaneous breathing, nonlabored ventilation, respiratory function stable and patient connected to nasal cannula oxygen  Cardiovascular status: blood pressure returned to baseline and stable Postop Assessment: no apparent nausea or vomiting Anesthetic complications: no   There were no known notable events for this encounter.  Last Vitals:  Vitals:   11/05/23 0940 11/05/23 0945  BP: 108/60 (!) 106/56  Pulse: 63 63  Resp: (!) 24 19  Temp:    SpO2: 96% 93%    Last Pain:  Vitals:   11/05/23 0940  TempSrc:   PainSc: 0-No pain                 Thom JONELLE Peoples

## 2023-11-05 NOTE — Transfer of Care (Signed)
 Immediate Anesthesia Transfer of Care Note  Patient: Theresa Harrington  Procedure(s) Performed: CARDIOVERSION  Patient Location: Cath Lab  Anesthesia Type:General  Level of Consciousness: awake, alert , and oriented  Airway & Oxygen  Therapy: Patient Spontanous Breathing and Patient connected to nasal cannula oxygen   Post-op Assessment: Report given to RN and Post -op Vital signs reviewed and stable  Post vital signs: Reviewed and stable  Last Vitals:  Vitals Value Taken Time  BP 119/79 11/05/23 08:45  Temp 36   Pulse 130 11/05/23 08:52  Resp 17 11/05/23 08:52  SpO2 95 % 11/05/23 08:52  Vitals shown include unfiled device data.  Last Pain:  Vitals:   11/05/23 0850  TempSrc:   PainSc: 0-No pain         Complications: No notable events documented.

## 2023-11-05 NOTE — Interval H&P Note (Signed)
 History and Physical Interval Note:  11/05/2023 8:10 AM  Theresa Harrington  has presented today for surgery, with the diagnosis of AFIB.  The various methods of treatment have been discussed with the patient and family. After consideration of risks, benefits and other options for treatment, the patient has consented to  Procedure(s): CARDIOVERSION (N/A) as a surgical intervention.  The patient's history has been reviewed, patient examined, no change in status, stable for surgery.  I have reviewed the patient's chart and labs.  Questions were answered to the patient's satisfaction.     Vinie [name]

## 2023-11-07 ENCOUNTER — Ambulatory Visit (INDEPENDENT_AMBULATORY_CARE_PROVIDER_SITE_OTHER): Admitting: Internal Medicine

## 2023-11-07 ENCOUNTER — Encounter: Payer: Self-pay | Admitting: Internal Medicine

## 2023-11-07 VITALS — BP 122/64 | HR 58 | Temp 97.9°F | Ht 65.0 in | Wt 182.8 lb

## 2023-11-07 DIAGNOSIS — E1159 Type 2 diabetes mellitus with other circulatory complications: Secondary | ICD-10-CM

## 2023-11-07 DIAGNOSIS — E538 Deficiency of other specified B group vitamins: Secondary | ICD-10-CM

## 2023-11-07 DIAGNOSIS — I10 Essential (primary) hypertension: Secondary | ICD-10-CM

## 2023-11-07 DIAGNOSIS — R5382 Chronic fatigue, unspecified: Secondary | ICD-10-CM | POA: Insufficient documentation

## 2023-11-07 DIAGNOSIS — G63 Polyneuropathy in diseases classified elsewhere: Secondary | ICD-10-CM | POA: Diagnosis not present

## 2023-11-07 DIAGNOSIS — Z794 Long term (current) use of insulin: Secondary | ICD-10-CM

## 2023-11-07 DIAGNOSIS — K5904 Chronic idiopathic constipation: Secondary | ICD-10-CM

## 2023-11-07 DIAGNOSIS — D509 Iron deficiency anemia, unspecified: Secondary | ICD-10-CM | POA: Diagnosis not present

## 2023-11-07 DIAGNOSIS — E1151 Type 2 diabetes mellitus with diabetic peripheral angiopathy without gangrene: Secondary | ICD-10-CM

## 2023-11-07 MED ORDER — OZEMPIC (1 MG/DOSE) 4 MG/3ML ~~LOC~~ SOPN: 1.0000 mg | PEN_INJECTOR | SUBCUTANEOUS | 1 refills | Status: DC

## 2023-11-07 MED ORDER — LUBIPROSTONE 24 MCG PO CAPS: 24.0000 ug | ORAL_CAPSULE | Freq: Two times a day (BID) | ORAL | 1 refills | Status: DC

## 2023-11-07 MED ORDER — METHYLPHENIDATE HCL ER (OSM) 18 MG PO TBCR: 18.0000 mg | EXTENDED_RELEASE_TABLET | Freq: Every day | ORAL | 0 refills | Status: DC

## 2023-11-07 MED ORDER — CYANOCOBALAMIN 1000 MCG/ML IJ SOLN
1000.0000 ug | Freq: Once | INTRAMUSCULAR | Status: AC
  Administered 2023-11-07: 1000 ug via INTRAMUSCULAR

## 2023-11-07 NOTE — Progress Notes (Signed)
 Subjective:  Patient ID: Theresa Harrington, female    DOB: 07-23-1955  Age: 68 y.o. MRN: [id number]  CC: Fatigue, Bloated, Weight Gain, Wheezing, and Bowel Movements No regular    HPI [name] presents for f/up ----  Discussed the use of AI scribe software for clinical note transcription with the patient, who gave verbal consent to proceed.  History of Present Illness   Theresa Harrington is a 68 year old female who presents with bloating, weight gain, and fatigue.  She experiences bloating and significant weight gain despite reduced food intake. No abdominal pain is present. Fatigue and low energy levels are noted, with no dizziness or lightheadedness.  She has constipation and irregular bowel movements since her hospital discharge six months ago. She occasionally uses [place] for relief.  She wakes up with wheezing and difficulty clearing her throat due to phlegm, without associated pain.  Her legs feel okay, although one was previously swollen. She was prescribed torasemide following surgery and continues to take it.       Outpatient Medications Prior to Visit  Medication Sig Dispense Refill   aspirin  EC 81 MG tablet Take 1 tablet (81 mg total) by mouth daily. Swallow whole. (Patient taking differently: Take 325 mg by mouth daily. Swallow whole.)     bisacodyl  (DULCOLAX) 5 MG EC tablet Take 5 mg by mouth daily as needed for moderate constipation. (Patient taking differently: Take 5 mg by mouth as needed for moderate constipation.)     Blood Glucose Monitoring Suppl (ONETOUCH VERIO IQ SYSTEM) w/Device KIT USE TO CHECK SUGAR DAILY 1 kit 0   Continuous Glucose Sensor (FREESTYLE LIBRE 3 PLUS SENSOR) MISC Change sensor every 15 days. 2 each 5   diltiazem  (CARDIZEM  CD) 120 MG 24 hr capsule Take 1 capsule (120 mg total) by mouth daily. 90 capsule 0   diltiazem  (CARDIZEM ) 30 MG tablet Take 30 mg by mouth every 4 (four) hours as needed.     Docusate Sodium  (COLACE PO) Take 1 tablet by mouth daily as  needed. (Patient taking differently: Take 1 tablet by mouth as needed.)     dorzolamide -timolol  (COSOPT ) 22.3-6.8 MG/ML ophthalmic solution Place 1 drop into the right eye 2 (two) times daily.      ezetimibe  (ZETIA ) 10 MG tablet Take 1 tablet by mouth once daily 90 tablet 0   hydrALAZINE  (APRESOLINE ) 100 MG tablet Take 1 tablet (100 mg total) by mouth in the morning and at bedtime.     Insulin  Glargine (BASAGLAR  KWIKPEN) 100 UNIT/ML INJECT 50 UNITS UNDER THE SKIN DAILY 30 mL 5   insulin  lispro (HUMALOG  KWIKPEN) 100 UNIT/ML KwikPen Inject 14 Units into the skin with breakfast, with lunch, and with evening meal. DIAL  14 UNITS AND INJECT UNDER THE SKIN THREE TIMES A DAY. 45 mL 0   Insulin  Pen Needle (PEN NEEDLES) 32G X 4 MM MISC Inject 1 pen  as directed 4 (four) times daily. Use to inject levemir  or novolog  as directed. 400 each 3   latanoprost  (XALATAN ) 0.005 % ophthalmic solution Place 1 drop into both eyes at bedtime.      MAGNESIUM -OXIDE 400 (240 Mg) MG tablet Take 1 tablet by mouth once daily 90 tablet 0   nebivolol  (BYSTOLIC ) 10 MG tablet Take 1 tablet by mouth once daily 90 tablet 3   ONETOUCH VERIO test strip USE TO check blood glucose TWICE DAILY 200 strip 3   pantoprazole  (PROTONIX ) 40 MG tablet Take 1 tablet by mouth twice daily 180 tablet 0  rivaroxaban  (XARELTO ) 20 MG TABS tablet TAKE 1 TABLET BY MOUTH ONCE DAILY 90 tablet 0   rosuvastatin  (CRESTOR ) 5 MG tablet Take 1 tablet by mouth once daily 90 tablet 1   SYNJARDY  5-500 MG TABS TAKE 1 TABLET BY MOUTH TWICE DAILY 180 tablet 1   torsemide  (DEMADEX ) 10 MG tablet Take 1 tablet (10 mg total) by mouth daily. 90 tablet 0   losartan  (COZAAR ) 25 MG tablet Take 1 tablet (25 mg total) by mouth daily. NEEDS APPOINTMENT WITH DR. JOSHUA FOR REFILLS (Patient taking differently: Take 100 mg by mouth daily. NEEDS APPOINTMENT WITH DR. JOSHUA FOR REFILLS) 30 tablet 0   Semaglutide , 1 MG/DOSE, (OZEMPIC , 1 MG/DOSE,) 4 MG/3ML SOPN Inject 1 mg into the skin  once a week. Via [employer] patient assistance 9 mL 1   No facility-administered medications prior to visit.    ROS Review of Systems  Constitutional:  Positive for fatigue and unexpected weight change (wt gain). Negative for appetite change and diaphoresis.  HENT:  Negative for trouble swallowing.   Eyes:  Negative for visual disturbance.  Respiratory: Negative.  Negative for cough, chest tightness, shortness of breath and wheezing.   Cardiovascular:  Negative for chest pain, palpitations and leg swelling.  Gastrointestinal:  Positive for constipation. Negative for abdominal pain, nausea and vomiting.  Genitourinary: Negative.  Negative for difficulty urinating.  Musculoskeletal: Negative.  Negative for arthralgias and myalgias.  Skin: Negative.   Neurological:  Negative for dizziness and weakness.  Hematological:  Negative for adenopathy. Does not bruise/bleed easily.  Psychiatric/Behavioral: Negative.      Objective:  BP 122/64 (BP Location: Left Arm, Patient Position: Sitting, Cuff Size: Normal)   Pulse (!) 58   Temp 97.9 F (36.6 C) (Oral)   Ht 5' 5 (1.651 m)   Wt 182 lb 12.8 oz (82.9 kg)   LMP  (LMP Unknown)   SpO2 96%   BMI 30.42 kg/m   BP Readings from Last 3 Encounters:  11/07/23 122/64  11/05/23 (!) 106/56  10/23/23 112/80    Wt Readings from Last 3 Encounters:  11/07/23 182 lb 12.8 oz (82.9 kg)  11/05/23 175 lb (79.4 kg)  10/23/23 180 lb 9.6 oz (81.9 kg)    Physical Exam Vitals reviewed.  Constitutional:      Appearance: Normal appearance. She is not ill-appearing.  HENT:     Mouth/Throat:     Mouth: Mucous membranes are moist.  Eyes:     General: No scleral icterus.    Conjunctiva/sclera: Conjunctivae normal.  Cardiovascular:     Rate and Rhythm: Regular rhythm. Bradycardia present.     Heart sounds: No murmur heard.    No friction rub. No gallop.  Pulmonary:     Effort: Pulmonary effort is normal.     Breath sounds: No stridor. No wheezing,  rhonchi or rales.  Abdominal:     General: Abdomen is protuberant. Bowel sounds are normal. There is no distension.     Palpations: Abdomen is soft. There is no hepatomegaly, splenomegaly or mass.     Tenderness: There is no abdominal tenderness. There is no guarding.  Musculoskeletal:     Cervical back: Neck supple.     Right lower leg: No edema.     Left lower leg: No edema.  Lymphadenopathy:     Cervical: No cervical adenopathy.  Skin:    General: Skin is warm and dry.  Neurological:     General: No focal deficit present.     Mental  Status: She is alert.  Psychiatric:        Mood and Affect: Mood normal.     Lab Results  Component Value Date   WBC 11.0 (H) 10/12/2023   HGB 11.1 (L) 10/12/2023   HCT 37.2 10/12/2023   PLT 374 10/12/2023   GLUCOSE 139 (H) 10/12/2023   CHOL 118 10/08/2023   TRIG 164.0 (H) 10/08/2023   HDL 46.60 10/08/2023   LDLDIRECT 160.2 03/01/2010   LDLCALC 38 10/08/2023   ALT 9 10/08/2023   AST 9 10/08/2023   NA 141 10/12/2023   K 4.4 10/12/2023   CL 106 10/12/2023   CREATININE 0.94 10/12/2023   BUN 20 10/12/2023   CO2 24 10/12/2023   TSH 0.70 10/12/2023   INR 1.0 03/09/2023   HGBA1C 8.9 (H) 10/12/2023   MICROALBUR 1.4 10/12/2023    EP STUDY Result Date: 11/05/2023 See surgical note for result.   Assessment & Plan:  Vitamin B12 deficiency neuropathy (HCC) -     Cyanocobalamin   DM (diabetes mellitus) type II, controlled, with peripheral vascular disorder (HCC) -     Ozempic  (1 MG/DOSE); Inject 1 mg into the skin once a week. Via [employer] patient assistance  Dispense: 9 mL; Refill: 1  Type 2 diabetes mellitus with other circulatory complication, with long-term current use of insulin  (HCC) -     Ozempic  (1 MG/DOSE); Inject 1 mg into the skin once a week. Via [employer] patient assistance  Dispense: 9 mL; Refill: 1  Iron  deficiency anemia, unspecified iron  deficiency anemia type- Will treat with iron  infusions. -     IBC + Ferritin;  Future  Essential hypertension, benign- BP is well controlled.  Chronic idiopathic constipation -     Lubiprostone ; Take 1 capsule (24 mcg total) by mouth 2 (two) times daily with a meal.  Dispense: 180 capsule; Refill: 1     Follow-up: Return in about 3 months (around 02/07/2024).  Debby Molt, MD

## 2023-11-07 NOTE — Patient Instructions (Signed)

## 2023-11-08 ENCOUNTER — Other Ambulatory Visit: Payer: Self-pay | Admitting: Internal Medicine

## 2023-11-08 ENCOUNTER — Telehealth: Payer: Self-pay | Admitting: Pharmacist

## 2023-11-08 ENCOUNTER — Telehealth: Payer: Self-pay

## 2023-11-08 ENCOUNTER — Other Ambulatory Visit (INDEPENDENT_AMBULATORY_CARE_PROVIDER_SITE_OTHER): Admitting: Pharmacist

## 2023-11-08 DIAGNOSIS — E1151 Type 2 diabetes mellitus with diabetic peripheral angiopathy without gangrene: Secondary | ICD-10-CM

## 2023-11-08 NOTE — Telephone Encounter (Signed)
 Called patient for scheduled pharmacist follow up telephone appt. No answer, left voicemail with direct call back number.  Darrelyn Drum, PharmD, BCPS, CPP Clinical Pharmacist Practitioner [facility] [phone]

## 2023-11-08 NOTE — Telephone Encounter (Signed)
 Dr. Joshua, patient will be scheduled as soon as possible.  Auth Submission: NO AUTH NEEDED Site of care: Site of care: [facility] Payer: [employer] Medication & CPT/J Code(s) submitted: Venofer (Iron Sucrose) J1756 Diagnosis Code:  Route of submission (phone, fax, portal):  Phone # Fax # Auth type: Buy/Bill PB Units/visits requested: 200mg  x 5 doses Reference number:  Approval from: 11/08/23 to 03/10/24

## 2023-11-08 NOTE — Telephone Encounter (Signed)
 Patient returned call.  Darrelyn Drum, PharmD, BCPS, CPP Clinical Pharmacist Practitioner [facility] [phone]

## 2023-11-08 NOTE — Progress Notes (Signed)
   11/08/2023 Name: Theresa Harrington MRN: [id number] DOB: May 28, 1955  Chief Complaint  Patient presents with   Diabetes   Medication Management    Theresa Harrington is a 68 y.o. year old female who presented for a telephone visit.   They were referred to the pharmacist by their PCP for assistance in managing diabetes.   Subjective:  Care Team: Primary Care Provider: Joshua Debby CROME, MD ; Next Scheduled Visit: not scheduled  Medication Access/Adherence  Current Pharmacy:  [facility] [address], [place] - [address] [facility] [place] [address] Phone: [phone] Fax: [phone]  [facility] - [place], [place] - [address] [place] [address] Phone: [phone] Fax: [phone]  [name] [address] [place] [address] Phone: [phone] Fax: [phone]  [facility] [place], [place] - [phone] [place] [address] Phone: [phone] Fax: [phone]  [facility] - [place], [place] - [address] [place] [address] Phone: [phone] Fax: [phone]   Patient reports affordability concerns with their medications: No  Patient reports access/transportation concerns to their pharmacy: No  Patient reports adherence concerns with their medications:  No     Diabetes:  Current medications: Basaglar  50 units daily, Humalog  14 units TID AC, Ozempic  1 mg weekly, Synjardy  5/500 mg BID   Current glucose readings: BG 125-140 in the AM Using glucometer; testing 1 time per day She used CGM at one point but notes insurance stopped covering it She is willing to use CGM again if insurance covers it - she picked up the [employer] sensors but has not been able to set up the app yet  Patient denies hypoglycemic s/sx including dizziness, shakiness, sweating. Patient denies hyperglycemic  symptoms including polyuria, polydipsia, polyphagia, nocturia, neuropathy, blurred vision.  Current meal patterns:  She tends to eat around 8 PM and then lays down at 9 PM  Current physical activity: some walking but has been taking it easy lately due to afib issues  Current medication access support: PAP through [facility], [facility], and [facility]   Objective:  Lab Results  Component Value Date   HGBA1C 8.9 (H) 10/12/2023    Lab Results  Component Value Date   CREATININE 0.94 10/12/2023   BUN 20 10/12/2023   NA 141 10/12/2023   K 4.4 10/12/2023   CL 106 10/12/2023   CO2 24 10/12/2023    Lab Results  Component Value Date   CHOL 118 10/08/2023   HDL 46.60 10/08/2023   LDLCALC 38 10/08/2023   LDLDIRECT 160.2 03/01/2010   TRIG 164.0 (H) 10/08/2023   CHOLHDL 3 10/08/2023    Medications Reviewed Today   Medications were not reviewed in this encounter       Assessment/Plan:   Diabetes: - Currently uncontrolled, A1c goal <7% - Reviewed dietary modifications including balanced meals, increased protein/fiber - Reviewed lifestyle modifications including: increased walking and strength training - Recommend to continue current regimen and starting using CGM to get better idea of BG trends to better adjust medications    Follow Up Plan: pt to call or message back by next week  Darrelyn Drum, PharmD, BCPS, CPP Clinical Pharmacist Practitioner [facility] [phone]

## 2023-11-09 ENCOUNTER — Other Ambulatory Visit: Payer: Self-pay | Admitting: Internal Medicine

## 2023-11-09 DIAGNOSIS — K2100 Gastro-esophageal reflux disease with esophagitis, without bleeding: Secondary | ICD-10-CM

## 2023-11-12 ENCOUNTER — Ambulatory Visit

## 2023-11-12 ENCOUNTER — Encounter: Payer: Self-pay | Admitting: Physician Assistant

## 2023-11-12 ENCOUNTER — Ambulatory Visit (HOSPITAL_COMMUNITY)
Admission: RE | Admit: 2023-11-12 | Discharge: 2023-11-12 | Disposition: A | Discharge status: Home / Self Care | Payer: PPO | Source: Ambulatory Visit | Attending: Surgery | Admitting: Surgery

## 2023-11-12 ENCOUNTER — Ambulatory Visit: Payer: PPO | Attending: Surgery | Admitting: Physician Assistant

## 2023-11-12 ENCOUNTER — Ambulatory Visit (HOSPITAL_BASED_OUTPATIENT_CLINIC_OR_DEPARTMENT_OTHER)
Admission: RE | Admit: 2023-11-12 | Discharge: 2023-11-12 | Disposition: A | Discharge status: Home / Self Care | Payer: PPO | Source: Ambulatory Visit | Attending: Surgery | Admitting: Surgery

## 2023-11-12 VITALS — BP 174/65 | HR 54 | Temp 97.9°F | Ht 65.0 in | Wt 173.2 lb

## 2023-11-12 DIAGNOSIS — I70213 Atherosclerosis of native arteries of extremities with intermittent claudication, bilateral legs: Secondary | ICD-10-CM

## 2023-11-12 DIAGNOSIS — I7025 Atherosclerosis of native arteries of other extremities with ulceration: Secondary | ICD-10-CM

## 2023-11-12 DIAGNOSIS — I739 Peripheral vascular disease, unspecified: Secondary | ICD-10-CM | POA: Diagnosis not present

## 2023-11-12 NOTE — Progress Notes (Signed)
 HISTORY AND PHYSICAL     CC:  follow up. Requesting Provider:  Joshua Debby CROME, MD  HPI: This is a 68 y.o. female who is here today for follow up for PAD.  Pt has hx of left EIA and CFA endarterectomy with Dacron patch angioplasty and left femoral to BK popliteal bypass with non reversed GSV on 06/08/2014 by Dr. Oris for non healing toe ulcer.   She was taken back to the operating room on 06/09/2014 for occluded bypass and underwent left femoral to BK popliteal artery bypass with PTFE, thrombectomy of left popliteal artery by Dr. Serene.   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 Serene.  On 03/09/2023, she underwent right iliofemoral endarterectomy with vein patch angioplasty, right femoral to PTA bypass with composite graft (6mm external ring PTFE proximally and saphenous vein to PTA by Dr. Serene for right great toe ulcer.   Her left leg bypass is chronically occluded.   Pt was last seen 05/14/2023 and at that time, all her incisions had healed.  She felt her great toe ulcer was nearly healed.  She did have some RLE edema that was managed with compression. She was not having rest pain or claudication. She had underwent lower endoscopy by GI for anemia.  Triple therapy was held however since then she has been restarted on Xarelto  and aspirin .  Plavix  was discontinued.    The pt returns today for follow up.  She states that she gets some claudication only with strenuous walks or an incline.  She denies any rest pain or non healing wounds.  She does not smoke.  She is compliant with her statin, asa and Xarelto .   The pt is on a statin for cholesterol management.    The pt is on an aspirin .    Other AC:  Xarelto  The pt is on CCB, BB for hypertension.  The pt is  on diabetic medication. Tobacco hx:  former  Pt does not have family hx of AAA.  Past Medical History:  Diagnosis Date   Allergy    Anxiety    Pt. denies   Arthritis    right index figer   Asymptomatic  cholelithiasis    Atherosclerosis of aorta (HCC)    Atrial flutter, paroxysmal (HCC) 02/20/2023   Cataract    Clotting disorder (HCC)    Gallstones 06/2013   GERD (gastroesophageal reflux disease)    Glaucoma    A LITTLE BIT   History of kidney stones    lithotrispy   Hyperlipidemia    Hypertension    Hypothyroidism    no meds   Iron  deficiency anemia    PAD (peripheral artery disease) (HCC)    PAF (paroxysmal atrial fibrillation) (HCC)    Peripheral arterial occlusive disease (HCC)    lower extremities   Pneumonia    PONV (postoperative nausea and vomiting)    Right ureteral stone    Type 2 diabetes mellitus (HCC)    Type   Vitamin B 12 deficiency    Wears glasses     Past Surgical History:  Procedure Laterality Date   ABDOMINAL AORTOGRAM W/LOWER EXTREMITY N/A 03/09/2020   Procedure: ABDOMINAL AORTOGRAM W/LOWER EXTREMITY;  Surgeon: Serene Gaile ORN, MD;  [facility]: [facility];  Service: Cardiovascular;  Laterality: N/A;   ABDOMINAL AORTOGRAM W/LOWER EXTREMITY N/A 03/06/2023   Procedure: ABDOMINAL AORTOGRAM W/LOWER EXTREMITY;  Surgeon: Serene Gaile ORN, MD;  Location: [facility];  Service: Cardiovascular;  Laterality:  N/A;   AMPUTATION Left 07/16/2014   Procedure: LEFT SECOND TOE AMPUTATION;  Surgeon: Gaile LELON New, MD;  Location: [facility] OR;  Service: Vascular;  Laterality: Left;  With Nerve block   ATRIAL FIBRILLATION ABLATION N/A 08/02/2021   Procedure: ATRIAL FIBRILLATION ABLATION;  Surgeon: Inocencio Soyla Lunger, MD;  Location: [facility];  Service: Cardiovascular;  Laterality: N/A;   AUGMENTATION MAMMAPLASTY     BELPHAROPTOSIS REPAIR     eyelid lift   BIOPSY  10/14/2018   Procedure: BIOPSY;  Surgeon: Wilhelmenia Aloha Raddle., MD;  Location: [facility] ENDOSCOPY;  Service: Gastroenterology;;   BIOPSY  08/04/2019   Procedure: BIOPSY;  Surgeon: Wilhelmenia Aloha Raddle., MD;  Location: [facility] ENDOSCOPY;  Service: Gastroenterology;;   CARDIOVERSION N/A 09/19/2019    Procedure: CARDIOVERSION;  Surgeon: Lonni Slain, MD;  Location: [facility] ENDOSCOPY;  Service: Cardiovascular;  Laterality: N/A;   CARDIOVERSION N/A 11/05/2023   Procedure: CARDIOVERSION;  Surgeon: Mona Vinie BROCKS, MD;  [facility]: [facility];  Service: Cardiovascular;  Laterality: N/A;   COLONOSCOPY     COLONOSCOPY WITH PROPOFOL  Left 04/11/2023   Procedure: COLONOSCOPY WITH PROPOFOL ;  Surgeon: Legrand Victory LITTIE DOUGLAS, MD;  Location: [facility] ENDOSCOPY;  Service: Gastroenterology;  Laterality: Left;   COMBINED AUGMENTATION MAMMAPLASTY AND ABDOMINOPLASTY  2009   W/  BILATERAL  THIGH LIFT   CYSTO/  RIGHT URETERAL STENT PLACEMENT  12/30/2010   CYSTOSCOPY WITH RETROGRADE PYELOGRAM, URETEROSCOPY AND STENT PLACEMENT Right 10/07/2013   Procedure: CYSTOSCOPY WITH RETROGRADE PYELOGRAM, right URETEROSCOPY AND STENT PLACEMENT, stone extraction;  Surgeon: Oliva VEAR Oiler, MD;  Location: [facility];  Service: Urology;  Laterality: Right;   CYSTOSCOPY WITH STENT PLACEMENT Right 06/04/2013   Procedure: CYSTOSCOPY WITH STENT PLACEMENT;  Surgeon: Garnette Shack, MD;  Location: [facility];  Service: Urology;  Laterality: Right;   DILATATION & CURETTAGE/HYSTEROSCOPY WITH MYOSURE N/A 04/27/2015   Procedure: DILATATION & CURETTAGE/HYSTEROSCOPY WITH MYOSURE;  Surgeon: Curlee VEAR Guan, MD;  Location: [facility];  Service: Gynecology;  Laterality: N/A;   ENDARTERECTOMY FEMORAL Left 06/08/2014   Procedure: Left Leg Common Femoral and External Iliac  Endartarectomy with patch Angioplasty;  Surgeon: Krystal JULIANNA Doing, MD;  Location: [facility] OR;  Service: Vascular;  Laterality: Left;   ENTEROSCOPY N/A 04/09/2023   Procedure: ENTEROSCOPY;  Surgeon: Legrand Victory LITTIE DOUGLAS, MD;  Location: [facility] ENDOSCOPY;  Service: Gastroenterology;  Laterality: N/A;   ESOPHAGOGASTRODUODENOSCOPY N/A 10/14/2018   Procedure: ESOPHAGOGASTRODUODENOSCOPY (EGD);  Surgeon: Wilhelmenia Aloha Raddle., MD;  Location: [facility] ENDOSCOPY;  Service: Gastroenterology;   Laterality: N/A;   ESOPHAGOGASTRODUODENOSCOPY (EGD) WITH PROPOFOL  N/A 11/06/2018   Procedure: ESOPHAGOGASTRODUODENOSCOPY (EGD) WITH PROPOFOL ;  Surgeon: Wilhelmenia Aloha Raddle., MD;  Location: [facility] ENDOSCOPY;  Service: Gastroenterology;  Laterality: N/A;  RFA   ESOPHAGOGASTRODUODENOSCOPY (EGD) WITH PROPOFOL  N/A 02/12/2019   Procedure: ESOPHAGOGASTRODUODENOSCOPY (EGD) WITH PROPOFOL ;  Surgeon: Wilhelmenia Aloha Raddle., MD;  Location: [facility] ENDOSCOPY;  Service: Gastroenterology;  Laterality: N/A;   ESOPHAGOGASTRODUODENOSCOPY (EGD) WITH PROPOFOL  N/A 08/04/2019   Procedure: ESOPHAGOGASTRODUODENOSCOPY (EGD) WITH PROPOFOL ;  Surgeon: Wilhelmenia Aloha Raddle., MD;  Location: [facility] ENDOSCOPY;  Service: Gastroenterology;  Laterality: N/A;  WITH RFA   EXTRACORPOREAL SHOCK WAVE LITHOTRIPSY Right 08-04-2013//   06-23-2013//   01-16-2011   EYE SURGERY Bilateral    cataract   FEMORAL-POPLITEAL BYPASS GRAFT Left 06/08/2014   Procedure: Left Leg Femoral -Popliteal Bypass Graft;  Surgeon: Krystal JULIANNA Doing, MD;  Location: [facility] OR;  Service: Vascular;  Laterality: Left;   FEMORAL-POPLITEAL BYPASS GRAFT Left 06/09/2014   Procedure: Left Femoral  and Popliteal Exposure; Left Femoral to Anterior Tibial Bypass Graft using Propaten 6mm by 80cm Goretex Graft; Left Tibial Endarterectomy; Left Femoraland Popliteal Thrombectomy ;  Surgeon: Gaile LELON New, MD;  Location: [facility] OR;  Service: Vascular;  Laterality: Left;   FEMORAL-TIBIAL BYPASS GRAFT Right 03/09/2023   Procedure: RIGHT FEMORAL-POSTERIOR TIBIAL ARTERY BYPASS USING X 80CM PROPATEN GRAFT;  Surgeon: New Gaile LELON, MD;  Location: [facility];  Service: Vascular;  Laterality: Right;   GI RADIOFREQUENCY ABLATION N/A 11/06/2018   Procedure: GI RADIOFREQUENCY ABLATION;  Surgeon: Wilhelmenia Aloha Raddle., MD;  Location: [facility] ENDOSCOPY;  Service: Gastroenterology;  Laterality: N/A;   GI RADIOFREQUENCY ABLATION N/A 02/12/2019   Procedure: GI RADIOFREQUENCY ABLATION;  Surgeon: Wilhelmenia Aloha Raddle., MD;  Location: [facility] ENDOSCOPY;  Service: Gastroenterology;  Laterality: N/A;   GI RADIOFREQUENCY ABLATION N/A 08/04/2019   Procedure: GI RADIOFREQUENCY ABLATION;  Surgeon: Wilhelmenia Aloha Raddle., MD;  Location: [facility] ENDOSCOPY;  Service: Gastroenterology;  Laterality: N/A;   GIVENS CAPSULE STUDY N/A 04/09/2023   Procedure: GIVENS CAPSULE STUDY;  Surgeon: Legrand Victory LITTIE DOUGLAS, MD;  Location: [facility] ENDOSCOPY;  Service: Gastroenterology;  Laterality: N/A;   HOLMIUM LASER APPLICATION Right 10/07/2013   Procedure: HOLMIUM LASER APPLICATION;  Surgeon: Oliva VEAR Oiler, MD;  Location: [facility];  Service: Urology;  Laterality: Right;   KIDNEY STONE SURGERY  07/2013   1-2 stones   LAPAROSCOPIC GASTRIC BANDING  05/29/2005   LITHOTRIPSY  2-3 times   LOWER EXTREMITY ANGIOGRAM N/A 06/04/2014   Procedure: LOWER EXTREMITY ANGIOGRAM;  Surgeon: Gaile LELON New, MD;  Location: [facility] CATH LAB;  Service: Cardiovascular;  Laterality: N/A;   ORIF FIFTH METACARPAL FX  RIGHT HAND  04/21/2002   PATCH ANGIOPLASTY Right 03/09/2023   Procedure: VEIN PATCH ANGIOPLASTY TO RIGHT FEMORAL ARTERY;  Surgeon: New Gaile LELON, MD;  Location: [facility] OR;  Service: Vascular;  Laterality: Right;   POLYPECTOMY     REVISION AND RE-SITING LAP-BAND PORT  04/08/2010   W/  UPPER EGD   RIGHT KNEE PATELLECTOMY W/ REPAIR OF EXTENSOR MECHANISM  04/14/2002   TEE WITHOUT CARDIOVERSION N/A 12/24/2019   Procedure: TRANSESOPHAGEAL ECHOCARDIOGRAM (TEE);  Surgeon: Shlomo Wilbert SAUNDERS, MD;  Location: [facility] ENDOSCOPY;  Service: Cardiovascular;  Laterality: N/A;   Toenail removed Left 05/21/2014   2nd toenail-  Dr. Christine    Allergies  Allergen Reactions   Amlodipine  Swelling   Atorvastatin  Other (See Comments)    Muscle aches    Ampicillin  Nausea And Vomiting and Other (See Comments)   Codeine Nausea And Vomiting    Ask patient   Lisinopril Cough        Penicillins Nausea And Vomiting    Did it involve swelling of the face/tongue/throat, SOB,  or low BP? No Did it involve sudden or severe rash/hives, skin peeling, or any reaction on the inside of your mouth or nose? No Did you need to seek medical attention at a hospital or doctor's office? No When did it last happen?      20+ years If all above answers are NO, may proceed with cephalosporin use.    Januvia  [Sitagliptin ] Nausea And Vomiting    Current Outpatient Medications  Medication Sig Dispense Refill   aspirin  EC 81 MG tablet Take 1 tablet (81 mg total) by mouth daily. Swallow whole. (Patient taking differently: Take 325 mg by mouth daily. Swallow whole.)     bisacodyl  (DULCOLAX) 5 MG EC tablet Take 5 mg by mouth daily as needed for moderate constipation. (  Patient taking differently: Take 5 mg by mouth as needed for moderate constipation.)     Blood Glucose Monitoring Suppl (ONETOUCH VERIO IQ SYSTEM) w/Device KIT USE TO CHECK SUGAR DAILY 1 kit 0   Continuous Glucose Sensor (FREESTYLE LIBRE 3 PLUS SENSOR) MISC Change sensor every 15 days. 2 each 5   diltiazem  (CARDIZEM  CD) 120 MG 24 hr capsule Take 1 capsule (120 mg total) by mouth daily. 90 capsule 0   diltiazem  (CARDIZEM ) 30 MG tablet Take 30 mg by mouth every 4 (four) hours as needed.     Docusate Sodium  (COLACE PO) Take 1 tablet by mouth daily as needed. (Patient taking differently: Take 1 tablet by mouth as needed.)     dorzolamide -timolol  (COSOPT ) 22.3-6.8 MG/ML ophthalmic solution Place 1 drop into the right eye 2 (two) times daily.      ezetimibe  (ZETIA ) 10 MG tablet Take 1 tablet by mouth once daily 90 tablet 0   hydrALAZINE  (APRESOLINE ) 100 MG tablet Take 1 tablet (100 mg total) by mouth in the morning and at bedtime.     Insulin  Glargine (BASAGLAR  KWIKPEN) 100 UNIT/ML INJECT 50 UNITS UNDER THE SKIN DAILY 30 mL 5   insulin  lispro (HUMALOG  KWIKPEN) 100 UNIT/ML KwikPen Inject 14 Units into the skin with breakfast, with lunch, and with evening meal. DIAL  14 UNITS AND INJECT UNDER THE SKIN THREE TIMES A DAY. 45 mL 0    Insulin  Pen Needle (PEN NEEDLES) 32G X 4 MM MISC Inject 1 pen  as directed 4 (four) times daily. Use to inject levemir  or novolog  as directed. 400 each 3   latanoprost  (XALATAN ) 0.005 % ophthalmic solution Place 1 drop into both eyes at bedtime.      lubiprostone  (AMITIZA ) 24 MCG capsule Take 1 capsule (24 mcg total) by mouth 2 (two) times daily with a meal. 180 capsule 1   MAGNESIUM -OXIDE 400 (240 Mg) MG tablet Take 1 tablet by mouth once daily 90 tablet 0   nebivolol  (BYSTOLIC ) 10 MG tablet Take 1 tablet by mouth once daily 90 tablet 3   ONETOUCH VERIO test strip USE TO check blood glucose TWICE DAILY 200 strip 3   pantoprazole  (PROTONIX ) 40 MG tablet Take 1 tablet by mouth twice daily 180 tablet 0   rivaroxaban  (XARELTO ) 20 MG TABS tablet TAKE 1 TABLET BY MOUTH ONCE DAILY 90 tablet 0   rosuvastatin  (CRESTOR ) 5 MG tablet Take 1 tablet by mouth once daily 90 tablet 1   Semaglutide , 1 MG/DOSE, (OZEMPIC , 1 MG/DOSE,) 4 MG/3ML SOPN Inject 1 mg into the skin once a week. Via [employer] patient assistance 9 mL 1   SYNJARDY  5-500 MG TABS TAKE 1 TABLET BY MOUTH TWICE DAILY 180 tablet 1   torsemide  (DEMADEX ) 10 MG tablet Take 1 tablet (10 mg total) by mouth daily. 90 tablet 0   No current facility-administered medications for this visit.    Family History  Problem Relation Age of Onset   Kidney disease Mother    Hyperlipidemia Mother    Other Mother        AAA   and    Amputation   Diabetes Father    Heart disease Father    Deep vein thrombosis Father    Hyperlipidemia Father    Diabetes Sister    Heart disease Sister    Deep vein thrombosis Sister    Hyperlipidemia Sister    Diabetes Brother    Heart disease Brother    Cancer Brother    Hyperlipidemia Brother  Diabetes Brother    Diabetes Brother    Colon polyps Brother    Colon cancer Maternal Aunt    Arthritis Other    Cancer Other        colon   Hypertension Other    Stroke Other    Esophageal cancer Neg Hx    Stomach cancer  Neg Hx    Inflammatory bowel disease Neg Hx    Liver disease Neg Hx    Pancreatic cancer Neg Hx    Crohn's disease Neg Hx    Rectal cancer Neg Hx     Social History   Socioeconomic History   Marital status: Single    Spouse name: Not on file   Number of children: Not on file   Years of education: Not on file   Highest education level: Not on file  Occupational History   Occupation: Higher education careers adviser: [employer]  Tobacco Use   Smoking status: Former    Current packs/day: 0.00    Average packs/day: 1 pack/day for 28.0 years (28.0 ttl pk-yrs)    Types: Cigarettes    Start date: 09/05/1973    Quit date: 09/05/2001    Years since quitting: 22.2    Passive exposure: Never   Smokeless tobacco: Never  Vaping Use   Vaping status: Never Used  Substance and Sexual Activity   Alcohol use: No    Alcohol/week: 0.0 standard drinks of alcohol   Drug use: No   Sexual activity: Yes  Other Topics Concern   Not on file  Social History Narrative   Regular exercise- yes   Lives alone/2025   Social Drivers of Health   Financial Resource Strain: Low Risk  (10/15/2023)   Overall Financial Resource Strain (CARDIA)    Difficulty of Paying Living Expenses: Not hard at all  Food Insecurity: Low Risk  (10/23/2023)   Received from [facility]   Hunger Vital Sign    Within the past 12 months, you worried that your food would run out before you got money to buy more: Never true    Within the past 12 months, the food you bought just didn't last and you didn't have money to get more. : Never true  Transportation Needs: No Transportation Needs (10/23/2023)   Received from [facility]    In the past 12 months, has lack of reliable transportation kept you from medical appointments, meetings, work or from getting things needed for daily living? : No  Physical Activity: Sufficiently Active (10/15/2023)   Exercise Vital Sign    Days of Exercise per Week: 7 days    Minutes of  Exercise per Session: 30 min  Stress: No Stress Concern Present (10/15/2023)   [employer] of Occupational Health - Occupational Stress Questionnaire    Feeling of Stress: Not at all  Social Connections: Moderately Integrated (10/15/2023)   Social Connection and Isolation Panel    Frequency of Communication with Friends and Family: More than three times a week    Frequency of Social Gatherings with Friends and Family: Twice a week    Attends Religious Services: More than 4 times per year    Active Member of [employer] or Organizations: Yes    Attends Banker Meetings: Never    Marital Status: Never married  Intimate Partner Violence: Patient Unable To Answer (10/15/2023)   Humiliation, Afraid, Rape, and Kick questionnaire    Fear of Current or Ex-Partner: Patient unable to answer  Emotionally Abused: Patient unable to answer    Physically Abused: Patient unable to answer    Sexually Abused: Patient unable to answer     REVIEW OF SYSTEMS:  [X]  denotes positive finding, [ ]  denotes negative finding Cardiac  Comments:  Chest pain or chest pressure:    Shortness of breath upon exertion:    Short of breath when lying flat:    Irregular heart rhythm:        Vascular    Pain in calf, thigh, or hip brought on by ambulation:    Pain in feet at night that wakes you up from your sleep:     Blood clot in your veins:    Leg swelling:         Pulmonary    Oxygen  at home:    Productive cough:     Wheezing:         Neurologic    Sudden weakness in arms or legs:     Sudden numbness in arms or legs:     Sudden onset of difficulty speaking or slurred speech:    Temporary loss of vision in one eye:     Problems with dizziness:         Gastrointestinal    Blood in stool:     Vomited blood:         Genitourinary    Burning when urinating:     Blood in urine:        Psychiatric    Major depression:         Hematologic    Bleeding problems:    Problems with blood  clotting too easily:        Skin    Rashes or ulcers:        Constitutional    Fever or chills:      PHYSICAL EXAMINATION:  Today's Vitals   11/12/23 1539  BP: (!) 174/65  Pulse: (!) 54  Temp: 97.9 F (36.6 C)  TempSrc: Temporal  SpO2: 98%  Weight: 173 lb 3.2 oz (78.6 kg)  Height: 5' 5 (1.651 m)  PainSc: 0-No pain   Body mass index is 28.82 kg/m.   General:  WDWN in NAD; vital signs documented above Gait: Not observed HENT: WNL, normocephalic Pulmonary: normal non-labored breathing , without wheezing Cardiac: regular HR, without carotid bruits Abdomen: soft, NT; aortic pulse is not palpable Skin: without rashes Vascular Exam/Pulses:  Right Left  Radial 2+ (normal) 2+ (normal)  Femoral 2+ (normal) 2+ (normal)  DP monophasic 1+ (weak) brisk doppler flow  PT Brisk monophasic Brisk monophasic   Extremities: without ischemic changes, without Gangrene , without cellulitis; without open wounds Musculoskeletal: no muscle wasting or atrophy  Neurologic: A&O X 3 Psychiatric:  The pt has Normal affect.   Non-Invasive Vascular Imaging:   ABI's/TBI's on 11/12/2023: Right:  [place]/0.36 - Great toe pressure: 68 Left:  0.86/0.37 - Great toe pressure: 69  Arterial duplex on 11/12/2023: Right Graft #1: Right CFA to PTA bypass with composite PTFE proximally and  GSV to PTA.  +------------------+--------+---------------+----------+--------+                   PSV cm/sStenosis       Waveform  Comments  +------------------+--------+---------------+----------+--------+  Inflow           182     30-49% stenosisbiphasic            +------------------+--------+---------------+----------+--------+  Prox Anastomosis  39  monophasic          +------------------+--------+---------------+----------+--------+  Proximal Graft    31                     monophasic          +------------------+--------+---------------+----------+--------+  Mid Graft          26                     monophasic          +------------------+--------+---------------+----------+--------+  Distal Graft      26                                         +------------------+--------+---------------+----------+--------+  Distal Anastomosis539     >70% stenosis  monophasic          +------------------+--------+---------------+----------+--------+  Outflow          80                     monophasic          +------------------+--------+---------------+----------+--------+     +-----------+--------+-----+--------+----------+----------------+  LEFT      PSV cm/sRatioStenosisWaveform  Comments          +-----------+--------+-----+--------+----------+----------------+  ATA Distal 54                   monophasic                  +-----------+--------+-----+--------+----------+----------------+  PTA Distal 42                   monophasic                  +-----------+--------+-----+--------+----------+----------------+  PERO Distal                               appears occluded  +-----------+--------+-----+--------+----------+----------------+     Left Graft #1: Fem-pop  +--------------------+--------+--------+----------+--------------+                     PSV cm/sStenosisWaveform  Comments        +--------------------+--------+--------+----------+--------------+  Inflow             261             monophasic                +--------------------+--------+--------+----------+--------------+  Proximal Anastomosis58              monophasicPre occlusive   +--------------------+--------+--------+----------+--------------+  Proximal Graft              occluded                          +--------------------+--------+--------+----------+--------------+  Mid Graft                   occluded                          +--------------------+--------+--------+----------+--------------+  Distal Graft                                   Not visualized  +--------------------+--------+--------+----------+--------------+  Distal Anastomosis  Not visualized  +--------------------+--------+--------+----------+--------------+  Outflow                                      Not visualized  +--------------------+--------+--------+----------+--------------+   Summary:  Right: Patent graft with distal anastomosis stenosis > 70%. In graft velocities < 40 cm/s.   Left: Graft appears occluded. Distal graft not visualized. Flow noted in the popliteal fossa which may be in the native popliteal artery.   Previous ABI's/TBI's on 05/14/2023: Right:  [place]/0.54 - Great toe pressure: 117 Left:  0.81/0.47 - Great toe pressure:  102  Previous arterial duplex on 05/14/2023: Right Graft #1: Fem-Distal Tibial  +------------------+--------+---------------+--------+---------------------                   PSV cm/sStenosis       WaveformComments              +------------------+--------+---------------+--------+---------------------  Inflow           266     50-74% stenosisbiphasicRight distal EIA      +------------------+--------+---------------+--------+---------------------  Prox Anastomosis  50                     biphasic                      +------------------+--------+---------------+--------+---------------------   Proximal Graft    48                     biphasic                      +------------------+--------+---------------+--------+---------------------   Mid Graft         56                     biphasic                      +------------------+--------+---------------+--------+---------------------   Distal Graft      58                     biphasic                      +------------------+--------+---------------+--------+---------------------   Distal Anastomosis227     50-70% stenosisbiphasicVessel diameter   reduction: 0.60 cm  to 0.20 cm                   +------------------+--------+---------------+--------+---------------------  Outflow          268     50-74% stenosisbiphasic                      +------------------+--------+---------------+--------+---------------------     Summary:  Right: Patent femoral to distal tibial artery bypass graft with elevated  velocities at the external iliac artery inflow (50-74%), distal anastmosis  (50-74%) and outflow (50-74%).     ASSESSMENT/PLAN:: 68 y.o. female here for follow up for PAD with hx of  left EIA and CFA endarterectomy with Dacron patch angioplasty and left femoral to BK popliteal bypass with non reversed GSV on 06/08/2014 by Dr. Oris for non healing toe ulcer.   She was taken back to the operating room on 06/09/2014 for occluded bypass and underwent left femoral to BK popliteal artery bypass with PTFE, thrombectomy of left popliteal artery by Dr. Serene.   On 07/16/2014,  she underwent amputation of left 2nd toe by Dr. Serene.  On 03/09/2023, she underwent right iliofemoral endarterectomy with vein patch angioplasty, right femoral to PTA bypass with composite graft (6mm external ring PTFE proximally and saphenous vein to PTA by Dr. Serene for right great toe ulcer.    -pt with significantly elevated velocity at the distal anastomosis right leg bypass 539cm/s and decreased velocities throughout the graft.  Given the above, the bypass is threatened and recommend angiogram with RLE runoff and possible intervention.  She has palpable femoral pulses bilaterally.  Will get this scheduled with Dr. Serene in the near future.  -the left leg bypass is chronically occluded.  -continue asa/Xarelto /statin-her Xarelto  will need to be held prior to procedure.   -discussed importance of increased walking daily    [name] Apt, [facility] [phone]  Clinic MD:   Serene

## 2023-11-13 ENCOUNTER — Other Ambulatory Visit: Payer: Self-pay

## 2023-11-13 DIAGNOSIS — I739 Peripheral vascular disease, unspecified: Secondary | ICD-10-CM

## 2023-11-13 LAB — VAS US ABI WITH/WO TBI: Left ABI: 0.86

## 2023-11-14 ENCOUNTER — Ambulatory Visit

## 2023-11-14 ENCOUNTER — Telehealth: Payer: Self-pay | Admitting: Pharmacy Technician

## 2023-11-14 ENCOUNTER — Other Ambulatory Visit: Payer: Self-pay | Admitting: Internal Medicine

## 2023-11-14 VITALS — BP 142/66 | HR 54 | Temp 97.7°F | Resp 20 | Ht 64.0 in | Wt 173.6 lb

## 2023-11-14 DIAGNOSIS — K5904 Chronic idiopathic constipation: Secondary | ICD-10-CM

## 2023-11-14 DIAGNOSIS — D509 Iron deficiency anemia, unspecified: Secondary | ICD-10-CM

## 2023-11-14 MED ORDER — IRON SUCROSE 20 MG/ML IV SOLN
200.0000 mg | Freq: Once | INTRAVENOUS | Status: AC
  Administered 2023-11-14: 200 mg via INTRAVENOUS
  Filled 2023-11-14: qty 10

## 2023-11-14 NOTE — Progress Notes (Addendum)
   11/14/2023 Name: Theresa Harrington MRN: [id number] DOB: [birth year]  Patient is appearing on a report for True [place] Metric Diabetes and last engaged with the clinical pharmacist to discuss diabetes on 11/08/2023. Contacted patient today to discuss diabetes management and completed medication review.   Diabetes Plan from last clinical pharmacist appointment:  Diabetes: - Currently uncontrolled, A1c goal <7% - Reviewed dietary modifications including balanced meals, increased protein/fiber - Reviewed lifestyle modifications including: increased walking and strength training - Recommend to continue current regimen and starting using CGM to get better idea of BG trends to better adjust medications    Left patient a message earlier today and she was returning the call.   Medication Adherence Barriers Identified:  Patient made recommended medication changes per plan: No Patient has not sent up and has not started using the CGM. Patient is checking blood sugars as prescribed: Yes She ifnroms she is checking blood sugars and this morning it was 96 She informs she will have to call me back as this was not a good time to talk.    Next clinical pharmacist appointment is scheduled for: TBD  Kate Caddy, CPhT [facility] Office: [phone] Email: [email or web address]

## 2023-11-14 NOTE — Progress Notes (Signed)
 Diagnosis: Iron Deficiency Anemia  Provider:  Chilton Greathouse MD  Procedure: IV Push  IV Type: Peripheral, IV Location: R Antecubital  Venofer (Iron Sucrose), Dose: 200 mg  Post Infusion IV Care: Observation period completed and Peripheral IV Discontinued  Discharge: Condition: Good, Destination: Home . AVS Provided  Performed by:  Loney Hering, LPN

## 2023-11-15 ENCOUNTER — Ambulatory Visit (HOSPITAL_COMMUNITY)
Admission: RE | Admit: 2023-11-15 | Discharge: 2023-11-15 | Disposition: A | Discharge status: Home / Self Care | Source: Ambulatory Visit | Attending: Internal Medicine | Admitting: Internal Medicine

## 2023-11-15 VITALS — BP 132/62 | HR 58 | Ht 64.0 in | Wt 173.6 lb

## 2023-11-15 DIAGNOSIS — I4891 Unspecified atrial fibrillation: Secondary | ICD-10-CM

## 2023-11-15 DIAGNOSIS — I4819 Other persistent atrial fibrillation: Secondary | ICD-10-CM | POA: Diagnosis not present

## 2023-11-15 DIAGNOSIS — D6869 Other thrombophilia: Secondary | ICD-10-CM | POA: Diagnosis not present

## 2023-11-15 NOTE — Progress Notes (Signed)
 Primary Care Physician: Joshua Debby CROME, MD Referring Physician:Dr. Inocencio  Primary Cardiologist: Dr Jeffrie Ida Theresa Harrington is a 67 y.o. female with a h/o afib fem-pop bypass 06/08/14, DM, CAD, HTN, atrial fibrillation who presents for follow up in the [facility]. She had a DCCV in 2021 but had quick return of afib. Patient is s/p afib ablation with Dr Inocencio on 08/02/21.   On follow up 10/23/23, patient is currently in atrial flutter with RVR. Currently taking diltiazem  120 mg daily. No missed doses of Xarelto  20 mg daily. She took Ozempic  on Sunday.   On follow up 11/15/23, patient is currently in NSR. S/p successful DCCV on 11/05/23. No missed doses of Xarelto . Patient is scheduled for an abdominal aortogram on 8/12.   Today, she denies symptoms of palpitations, chest pain, shortness of breath, orthopnea, PND, lower extremity edema, dizziness, presyncope, syncope, or neurologic sequela. The patient is tolerating medications without difficulties and is otherwise without complaint today.   Past Medical History:  Diagnosis Date   Allergy    Anxiety    Pt. denies   Arthritis    right index figer   Asymptomatic cholelithiasis    Atherosclerosis of aorta (HCC)    Atrial flutter, paroxysmal (HCC) 02/20/2023   Cataract    Clotting disorder (HCC)    Gallstones 06/2013   GERD (gastroesophageal reflux disease)    Glaucoma    A LITTLE BIT   History of kidney stones    lithotrispy   Hyperlipidemia    Hypertension    Hypothyroidism    no meds   Iron  deficiency anemia    PAD (peripheral artery disease) (HCC)    PAF (paroxysmal atrial fibrillation) (HCC)    Peripheral arterial occlusive disease (HCC)    lower extremities   Pneumonia    PONV (postoperative nausea and vomiting)    Right ureteral stone    Type 2 diabetes mellitus (HCC)    Type   Vitamin B 12 deficiency    Wears glasses    Past Surgical History:  Procedure Laterality Date   ABDOMINAL  AORTOGRAM W/LOWER EXTREMITY N/A 03/09/2020   Procedure: ABDOMINAL AORTOGRAM W/LOWER EXTREMITY;  Surgeon: Serene Gaile ORN, MD;  [facility];  Service: Cardiovascular;  Laterality: N/A;   ABDOMINAL AORTOGRAM W/LOWER EXTREMITY N/A 03/06/2023   Procedure: ABDOMINAL AORTOGRAM W/LOWER EXTREMITY;  Surgeon: Serene Gaile ORN, MD;  [facility]: [facility];  Service: Cardiovascular;  Laterality: N/A;   AMPUTATION Left 07/16/2014   Procedure: LEFT SECOND TOE AMPUTATION;  Surgeon: Gaile ORN Serene, MD;  Location: [facility] OR;  Service: Vascular;  Laterality: Left;  With Nerve block   ATRIAL FIBRILLATION ABLATION N/A 08/02/2021   Procedure: ATRIAL FIBRILLATION ABLATION;  Surgeon: Inocencio Soyla Lunger, MD;  [facility];  Service: Cardiovascular;  Laterality: N/A;   AUGMENTATION MAMMAPLASTY     BELPHAROPTOSIS REPAIR     eyelid lift   BIOPSY  10/14/2018   Procedure: BIOPSY;  Surgeon: Wilhelmenia Aloha Raddle., MD;  Location: [facility] ENDOSCOPY;  Service: Gastroenterology;;   BIOPSY  08/04/2019   Procedure: BIOPSY;  Surgeon: Wilhelmenia Aloha Raddle., MD;  Location: [facility] ENDOSCOPY;  Service: Gastroenterology;;   CARDIOVERSION N/A 09/19/2019   Procedure: CARDIOVERSION;  Surgeon: Lonni Slain, MD;  Location: [facility] ENDOSCOPY;  Service: Cardiovascular;  Laterality: N/A;   CARDIOVERSION N/A 11/05/2023   Procedure: CARDIOVERSION;  Surgeon: Mona Vinie BROCKS, MD;  [facility];  Service: Cardiovascular;  Laterality: N/A;  COLONOSCOPY     COLONOSCOPY WITH PROPOFOL  Left 04/11/2023   Procedure: COLONOSCOPY WITH PROPOFOL ;  Surgeon: Legrand Victory LITTIE DOUGLAS, MD;  Location: [facility] ENDOSCOPY;  Service: Gastroenterology;  Laterality: Left;   COMBINED AUGMENTATION MAMMAPLASTY AND ABDOMINOPLASTY  2009   W/  BILATERAL  THIGH LIFT   CYSTO/  RIGHT URETERAL STENT PLACEMENT  12/30/2010   CYSTOSCOPY WITH RETROGRADE PYELOGRAM, URETEROSCOPY AND STENT PLACEMENT Right 10/07/2013   Procedure: CYSTOSCOPY  WITH RETROGRADE PYELOGRAM, right URETEROSCOPY AND STENT PLACEMENT, stone extraction;  Surgeon: Oliva VEAR Oiler, MD;  Location: [facility];  Service: Urology;  Laterality: Right;   CYSTOSCOPY WITH STENT PLACEMENT Right 06/04/2013   Procedure: CYSTOSCOPY WITH STENT PLACEMENT;  Surgeon: Garnette Shack, MD;  Location: [facility] ORS;  Service: Urology;  Laterality: Right;   DILATATION & CURETTAGE/HYSTEROSCOPY WITH MYOSURE N/A 04/27/2015   Procedure: DILATATION & CURETTAGE/HYSTEROSCOPY WITH MYOSURE;  Surgeon: Curlee VEAR Guan, MD;  Location: [facility]S;  Service: Gynecology;  Laterality: N/A;   ENDARTERECTOMY FEMORAL Left 06/08/2014   Procedure: Left Leg Common Femoral and External Iliac  Endartarectomy with patch Angioplasty;  Surgeon: Krystal JULIANNA Doing, MD;  Location: [facility] OR;  Service: Vascular;  Laterality: Left;   ENTEROSCOPY N/A 04/09/2023   Procedure: ENTEROSCOPY;  Surgeon: Legrand Victory LITTIE DOUGLAS, MD;  Location: [facility] ENDOSCOPY;  Service: Gastroenterology;  Laterality: N/A;   ESOPHAGOGASTRODUODENOSCOPY N/A 10/14/2018   Procedure: ESOPHAGOGASTRODUODENOSCOPY (EGD);  Surgeon: Wilhelmenia Aloha Raddle., MD;  Location: [facility] ENDOSCOPY;  Service: Gastroenterology;  Laterality: N/A;   ESOPHAGOGASTRODUODENOSCOPY (EGD) WITH PROPOFOL  N/A 11/06/2018   Procedure: ESOPHAGOGASTRODUODENOSCOPY (EGD) WITH PROPOFOL ;  Surgeon: Wilhelmenia Aloha Raddle., MD;  Location: [facility] ENDOSCOPY;  Service: Gastroenterology;  Laterality: N/A;  RFA   ESOPHAGOGASTRODUODENOSCOPY (EGD) WITH PROPOFOL  N/A 02/12/2019   Procedure: ESOPHAGOGASTRODUODENOSCOPY (EGD) WITH PROPOFOL ;  Surgeon: Wilhelmenia Aloha Raddle., MD;  Location: [facility] ENDOSCOPY;  Service: Gastroenterology;  Laterality: N/A;   ESOPHAGOGASTRODUODENOSCOPY (EGD) WITH PROPOFOL  N/A 08/04/2019   Procedure: ESOPHAGOGASTRODUODENOSCOPY (EGD) WITH PROPOFOL ;  Surgeon: Wilhelmenia Aloha Raddle., MD;  Location: [facility] ENDOSCOPY;  Service: Gastroenterology;  Laterality: N/A;  WITH RFA   EXTRACORPOREAL SHOCK WAVE  LITHOTRIPSY Right 08-04-2013//   06-23-2013//   01-16-2011   EYE SURGERY Bilateral    cataract   FEMORAL-POPLITEAL BYPASS GRAFT Left 06/08/2014   Procedure: Left Leg Femoral -Popliteal Bypass Graft;  Surgeon: Krystal JULIANNA Doing, MD;  Location: [facility] OR;  Service: Vascular;  Laterality: Left;   FEMORAL-POPLITEAL BYPASS GRAFT Left 06/09/2014   Procedure: Left Femoral and Popliteal Exposure; Left Femoral to Anterior Tibial Bypass Graft using Propaten 6mm by 80cm Goretex Graft; Left Tibial Endarterectomy; Left Femoraland Popliteal Thrombectomy ;  Surgeon: Gaile LELON New, MD;  Location: [facility] OR;  Service: Vascular;  Laterality: Left;   FEMORAL-TIBIAL BYPASS GRAFT Right 03/09/2023   Procedure: RIGHT FEMORAL-POSTERIOR TIBIAL ARTERY BYPASS USING X 80CM PROPATEN GRAFT;  Surgeon: New Gaile LELON, MD;  Location: [facility];  Service: Vascular;  Laterality: Right;   GI RADIOFREQUENCY ABLATION N/A 11/06/2018   Procedure: GI RADIOFREQUENCY ABLATION;  Surgeon: Wilhelmenia Aloha Raddle., MD;  Location: [facility] ENDOSCOPY;  Service: Gastroenterology;  Laterality: N/A;   GI RADIOFREQUENCY ABLATION N/A 02/12/2019   Procedure: GI RADIOFREQUENCY ABLATION;  Surgeon: Wilhelmenia Aloha Raddle., MD;  Location: [facility] ENDOSCOPY;  Service: Gastroenterology;  Laterality: N/A;   GI RADIOFREQUENCY ABLATION N/A 08/04/2019   Procedure: GI RADIOFREQUENCY ABLATION;  Surgeon: Wilhelmenia Aloha Raddle., MD;  Location: [facility] ENDOSCOPY;  Service: Gastroenterology;  Laterality: N/A;   GIVENS CAPSULE STUDY N/A 04/09/2023   Procedure: GIVENS CAPSULE  STUDY;  Surgeon: Legrand Victory LITTIE DOUGLAS, MD;  Location: [facility] ENDOSCOPY;  Service: Gastroenterology;  Laterality: N/A;   HOLMIUM LASER APPLICATION Right 10/07/2013   Procedure: HOLMIUM LASER APPLICATION;  Surgeon: Oliva VEAR Oiler, MD;  Location: [facility];  Service: Urology;  Laterality: Right;   KIDNEY STONE SURGERY  07/2013   1-2 stones   LAPAROSCOPIC GASTRIC BANDING  05/29/2005   LITHOTRIPSY  2-3 times    LOWER EXTREMITY ANGIOGRAM N/A 06/04/2014   Procedure: LOWER EXTREMITY ANGIOGRAM;  Surgeon: Gaile LELON New, MD;  Location: [facility] CATH LAB;  Service: Cardiovascular;  Laterality: N/A;   ORIF FIFTH METACARPAL FX  RIGHT HAND  04/21/2002   PATCH ANGIOPLASTY Right 03/09/2023   Procedure: VEIN PATCH ANGIOPLASTY TO RIGHT FEMORAL ARTERY;  Surgeon: New Gaile LELON, MD;  Location: [facility] OR;  Service: Vascular;  Laterality: Right;   POLYPECTOMY     REVISION AND RE-SITING LAP-BAND PORT  04/08/2010   W/  UPPER EGD   RIGHT KNEE PATELLECTOMY W/ REPAIR OF EXTENSOR MECHANISM  04/14/2002   TEE WITHOUT CARDIOVERSION N/A 12/24/2019   Procedure: TRANSESOPHAGEAL ECHOCARDIOGRAM (TEE);  Surgeon: Shlomo Wilbert SAUNDERS, MD;  Location: [facility] ENDOSCOPY;  Service: Cardiovascular;  Laterality: N/A;   Toenail removed Left 05/21/2014   2nd toenail-  Dr. Christine    Current Outpatient Medications  Medication Sig Dispense Refill   aspirin  EC 81 MG tablet Take 1 tablet (81 mg total) by mouth daily. Swallow whole. (Patient taking differently: Take 325 mg by mouth daily. Swallow whole.)     Blood Glucose Monitoring Suppl (ONETOUCH VERIO IQ SYSTEM) w/Device KIT USE TO CHECK SUGAR DAILY 1 kit 0   Continuous Glucose Sensor (FREESTYLE LIBRE 3 PLUS SENSOR) MISC Change sensor every 15 days. 2 each 5   diltiazem  (CARDIZEM  CD) 120 MG 24 hr capsule Take 1 capsule (120 mg total) by mouth daily. 90 capsule 0   diltiazem  (CARDIZEM ) 30 MG tablet Take 30 mg by mouth every 4 (four) hours as needed.     Docusate Sodium  (COLACE PO) Take 1 tablet by mouth daily as needed. (Patient taking differently: Take 1 tablet by mouth as needed.)     dorzolamide -timolol  (COSOPT ) 22.3-6.8 MG/ML ophthalmic solution Place 1 drop into the right eye 2 (two) times daily.      ezetimibe  (ZETIA ) 10 MG tablet Take 1 tablet by mouth once daily 90 tablet 0   hydrALAZINE  (APRESOLINE ) 100 MG tablet Take 1 tablet (100 mg total) by mouth in the morning and at bedtime.     Insulin   Glargine (BASAGLAR  KWIKPEN) 100 UNIT/ML INJECT 50 UNITS UNDER THE SKIN DAILY 30 mL 5   insulin  lispro (HUMALOG  KWIKPEN) 100 UNIT/ML KwikPen Inject 14 Units into the skin with breakfast, with lunch, and with evening meal. DIAL  14 UNITS AND INJECT UNDER THE SKIN THREE TIMES A DAY. 45 mL 0   Insulin  Pen Needle (PEN NEEDLES) 32G X 4 MM MISC Inject 1 pen  as directed 4 (four) times daily. Use to inject levemir  or novolog  as directed. 400 each 3   latanoprost  (XALATAN ) 0.005 % ophthalmic solution Place 1 drop into both eyes at bedtime.      lubiprostone  (AMITIZA ) 24 MCG capsule Take 1 capsule (24 mcg total) by mouth 2 (two) times daily with a meal. 180 capsule 1   MAGNESIUM -OXIDE 400 (240 Mg) MG tablet Take 1 tablet by mouth once daily 90 tablet 0   methylphenidate  18 MG PO CR tablet Take 18 mg by mouth daily. (Patient taking  differently: Take 18 mg by mouth as needed. She is trying to take it daily)     nebivolol  (BYSTOLIC ) 10 MG tablet Take 1 tablet by mouth once daily 90 tablet 3   ONETOUCH VERIO test strip USE TO check blood glucose TWICE DAILY 200 strip 3   pantoprazole  (PROTONIX ) 40 MG tablet Take 1 tablet by mouth twice daily 180 tablet 0   rivaroxaban  (XARELTO ) 20 MG TABS tablet TAKE 1 TABLET BY MOUTH ONCE DAILY 90 tablet 0   rosuvastatin  (CRESTOR ) 5 MG tablet Take 1 tablet by mouth once daily 90 tablet 1   Semaglutide , 1 MG/DOSE, (OZEMPIC , 1 MG/DOSE,) 4 MG/3ML SOPN Inject 1 mg into the skin once a week. Via [employer] patient assistance 9 mL 1   SYNJARDY  5-500 MG TABS TAKE 1 TABLET BY MOUTH TWICE DAILY 180 tablet 1   torsemide  (DEMADEX ) 10 MG tablet Take 1 tablet (10 mg total) by mouth daily. (Patient not taking: Reported on 11/15/2023) 90 tablet 0   No current facility-administered medications for this encounter.    Allergies  Allergen Reactions   Amlodipine  Swelling   Atorvastatin  Other (See Comments)    Muscle aches    Ampicillin  Nausea And Vomiting and Other (See Comments)   Codeine  Nausea And Vomiting    Ask patient   Lisinopril Cough        Penicillins Nausea And Vomiting    Did it involve swelling of the face/tongue/throat, SOB, or low BP? No Did it involve sudden or severe rash/hives, skin peeling, or any reaction on the inside of your mouth or nose? No Did you need to seek medical attention at a hospital or doctor's office? No When did it last happen?      20+ years If all above answers are NO, may proceed with cephalosporin use.    Januvia  [Sitagliptin ] Nausea And Vomiting   ROS- All systems are reviewed and negative except as per the HPI above  Physical Exam: Vitals:   11/15/23 1028  BP: 132/62  Pulse: (!) 58  Weight: 78.7 kg  Height: 5' 4 (1.626 m)      Wt Readings from Last 3 Encounters:  11/15/23 78.7 kg  11/14/23 78.7 kg  11/12/23 78.6 kg    Labs: Lab Results  Component Value Date   NA 141 10/12/2023   K 4.4 10/12/2023   CL 106 10/12/2023   CO2 24 10/12/2023   GLUCOSE 139 (H) 10/12/2023   BUN 20 10/12/2023   CREATININE 0.94 10/12/2023   CALCIUM  9.5 10/12/2023   MG 1.6 (L) 04/11/2023   Lab Results  Component Value Date   INR 1.0 03/09/2023   Lab Results  Component Value Date   CHOL 118 10/08/2023   HDL 46.60 10/08/2023   LDLCALC 38 10/08/2023   TRIG 164.0 (H) 10/08/2023   GEN- The patient is well appearing, alert and oriented x 3 today.   Neck - no JVD or carotid bruit noted Lungs- Clear to ausculation bilaterally, normal work of breathing Heart- Regular rate and rhythm, no murmurs, rubs or gallops, PMI not laterally displaced Extremities- no clubbing, cyanosis, or edema Skin - no rash or ecchymosis noted   EKG today demonstrates Vent. rate 58 BPM PR interval 158 ms QRS duration 74 ms QT/QTcB 506/496 ms P-R-T axes 50 45 41 Sinus bradycardia Cannot rule out Anterior infarct , age undetermined Abnormal ECG When compared with ECG of 05-Nov-2023 09:03, QT has lengthened  Echo 09/26/23:  1. Left ventricular  ejection fraction,  by estimation, is 60 to 65%. The  left ventricle has normal function. Left ventricular endocardial border  not optimally defined to evaluate regional wall motion. Left ventricular  diastolic parameters are  indeterminate.   2. Right ventricular systolic function is normal. The right ventricular  size is mildly enlarged.   3. Left atrial size was severely dilated.   4. 2D Planimetry of 2.34 cm2. Continuity equation MVA 1.32 cm . The  mitral valve is degenerative. No evidence of mitral valve regurgitation.  Mild to moderate mitral stenosis. The mean mitral valve gradient is 5.0  mmHg with average heart rate of 61 bpm.   Severe mitral annular calcification.   5. The aortic valve is tricuspid. There is mild calcification of the  aortic valve. Aortic valve regurgitation is not visualized. Aortic valve  sclerosis is present, with no evidence of aortic valve stenosis.   6. Dilated pulmonary artery.   7. The inferior vena cava is normal in size with greater than 50%  respiratory variability, suggesting right atrial pressure of 3 mmHg.    CHA2DS2-VASc Score = 4  The patient's score is based upon: CHF History: 0 HTN History: 1 Diabetes History: 0 Stroke History: 0 Vascular Disease History: 1 Age Score: 1 Gender Score: 1       ASSESSMENT AND PLAN: 1. Persistent Atrial Fibrillation/atrial flutter The patient's CHA2DS2-VASc score is 4, indicating a 4.8% annual risk of stroke.   S/p Afib ablation 08/02/21 by Dr. Inocencio. S/p successful DCCV on 11/05/23.  She is currently in NSR. She is feeling better in normal rhythm. Continue current medication management without change.  PR 166 ms Qtc in NSR prohibitive of Tikosyn.  2. Secondary Hypercoagulable State (ICD10:  D68.69) The patient is at significant risk for stroke/thromboembolism based upon her CHA2DS2-VASc Score of 4.  Continue Rivaroxaban  (Xarelto ).  No missed doses.   She can stop OAC for aortogram 30 days from  date of cardioversion.  3. HTN Stable today.  4. CAD CAC score on CT 704 On statin No chest pain.  5. PAD S/p fem pop bypass 2016 Followed by [facility]  6. VHD Mild-moderate MS Followed by Dr Jeffrie    Follow up as scheduled for aortogram. Follow up Afib clinic ~March 2026.   Dorn Heinrich, PA-C Afib Clinic [facility] [address] [place], [place] [address] [phone]

## 2023-11-19 ENCOUNTER — Ambulatory Visit

## 2023-11-19 ENCOUNTER — Encounter

## 2023-11-19 VITALS — BP 136/68 | HR 52 | Temp 97.9°F | Resp 14 | Ht 65.0 in | Wt 174.8 lb

## 2023-11-19 DIAGNOSIS — D509 Iron deficiency anemia, unspecified: Secondary | ICD-10-CM

## 2023-11-19 DIAGNOSIS — Z1231 Encounter for screening mammogram for malignant neoplasm of breast: Secondary | ICD-10-CM

## 2023-11-19 MED ORDER — IRON SUCROSE 20 MG/ML IV SOLN
200.0000 mg | Freq: Once | INTRAVENOUS | Status: AC
  Administered 2023-11-19: 200 mg via INTRAVENOUS
  Filled 2023-11-19: qty 10

## 2023-11-19 NOTE — Progress Notes (Signed)
 Diagnosis: Iron Deficiency Anemia  Provider:  Chilton Greathouse MD  Procedure: IV Push  IV Type: Peripheral, IV Location: R Forearm  Venofer (Iron Sucrose), Dose: 200 mg  Post Infusion IV Care: Observation period completed and Peripheral IV Discontinued  Discharge: Condition: Good, Destination: Home . AVS Declined  Performed by:  Garnette Czech, RN

## 2023-11-20 ENCOUNTER — Telehealth: Payer: Self-pay | Admitting: Pharmacy Technician

## 2023-11-20 MED ORDER — FREESTYLE LIBRE 3 READER DEVI: 0 refills | Status: DC

## 2023-11-20 NOTE — Progress Notes (Addendum)
   11/20/2023 Name: Theresa Harrington MRN: [id number] DOB: 1955-09-19  Patient is appearing on a report for True [place] Metric Diabetes and last engaged with the clinical pharmacist to discuss diabetes on 11/08/2023. Contacted patient today to discuss diabetes management and completed medication review.   Diabetes Plan from last clinical pharmacist appointment: Diabetes: - Currently uncontrolled, A1c goal <7% - Reviewed dietary modifications including balanced meals, increased protein/fiber - Reviewed lifestyle modifications including: increased walking and strength training - Recommend to continue current regimen and starting using CGM to get better idea of BG trends to better adjust medications -Follow Up Plan: pt to call or message back by next week  Medication Adherence Barriers Identified:  Patient made recommended medication changes per plan: Yes Patient informs she takes 50 units of Basaglar  per day, Synjardy  5/500 twice a day, Ozempic  1mg  weekly and she informs she uses Humalog  14 units maybe twice a day (am and pm). It is ordered as 3 times a day. Access issues with any new medication or testing device: Yes Unable to get CGM app downloaded on her phone. Patient is checking blood sugars as prescribed: Yes She is currently checking her blood sugar via fingersticks as she is unable to get the [employer] on her phone. She informs it keeps giving her an error message in regard to password. She informs her blood sugar varies between 109-169 depending on the time of day of the check. She reports no lows under 100 and no highs over 200. Will send in basket message to PharmD regarding CGM issue. ADDENDUM-Pharmacist sent in a Libre reader to patient's pharmacy. Called patient informed that her insurance should pay for this. Informed patient to call us  or send us  a mychart message if she is unable to get this set up and patient can come in for an in person visit or a phone visit with PharmD for  assistance with device.   Medication Adherence Barriers Addressed/Actions Taken:  Reviewed medication changes per plan from last clinical pharmacist note Medication Access for CGM device Will discuss medication access concerns with pharmacist Educated patient to contact pharmacy regarding new prescriptions/refills Patient Assistance Program approved for Basaglar , Humalog , Ozempic  and Synjarday Reviewed instructions for monitoring blood sugars at home and reminded patient to keep a written log to review with pharmacist Reminded patient of date/time of upcoming clinical pharmacist follow up and any upcoming PCP/specialists visits. Patient denies transportation barriers to the appointment. Yes  Next clinical pharmacist appointment is scheduled for: TBD  Kate Caddy, CPhT [facility] Office: [phone] Email: [email or web address]

## 2023-11-20 NOTE — Progress Notes (Signed)
 Sending Libre 3 Reader for patient to use since the app will not work on her cell phone. This should also be covered by her insurance. Asked Kate to inform patient that this will be sent to her pharmacy.  Darrelyn Drum, PharmD, BCPS, CPP Clinical Pharmacist Practitioner [facility] [phone]

## 2023-11-20 NOTE — Addendum Note (Signed)
 Addended by: [name] on: 11/20/2023 03:42 PM   Modules accepted: Orders

## 2023-11-21 ENCOUNTER — Ambulatory Visit

## 2023-11-21 MED ORDER — IRON SUCROSE 20 MG/ML IV SOLN: 200.0000 mg | Freq: Once | INTRAVENOUS | Status: DC

## 2023-11-22 ENCOUNTER — Encounter: Payer: Self-pay | Admitting: Internal Medicine

## 2023-11-23 ENCOUNTER — Ambulatory Visit

## 2023-11-23 VITALS — BP 146/65 | HR 63 | Temp 97.6°F | Resp 20 | Ht 64.0 in | Wt 174.4 lb

## 2023-11-23 DIAGNOSIS — D509 Iron deficiency anemia, unspecified: Secondary | ICD-10-CM

## 2023-11-23 MED ORDER — IRON SUCROSE 20 MG/ML IV SOLN
200.0000 mg | Freq: Once | INTRAVENOUS | Status: AC
  Administered 2023-11-23: 200 mg via INTRAVENOUS
  Filled 2023-11-23: qty 10

## 2023-11-23 NOTE — Progress Notes (Signed)
 Diagnosis: Iron Deficiency Anemia  Provider:  Chilton Greathouse MD  Procedure: IV Push  IV Type: Peripheral, IV Location: R Forearm  Venofer (Iron Sucrose), Dose: 200 mg  Post Infusion IV Care: Patient declined observation and Peripheral IV Discontinued  Discharge: Condition: Good, Destination: Home . AVS Declined  Performed by:  Adriana Mccallum, RN

## 2023-11-26 ENCOUNTER — Ambulatory Visit (INDEPENDENT_AMBULATORY_CARE_PROVIDER_SITE_OTHER)

## 2023-11-26 ENCOUNTER — Telehealth: Payer: Self-pay | Admitting: Radiology

## 2023-11-26 VITALS — BP 163/68 | HR 65 | Temp 98.6°F | Resp 16 | Ht 64.0 in | Wt 176.2 lb

## 2023-11-26 DIAGNOSIS — D509 Iron deficiency anemia, unspecified: Secondary | ICD-10-CM

## 2023-11-26 MED ORDER — IRON SUCROSE 20 MG/ML IV SOLN
200.0000 mg | Freq: Once | INTRAVENOUS | Status: AC
  Administered 2023-11-26: 200 mg via INTRAVENOUS
  Filled 2023-11-26: qty 10

## 2023-11-26 NOTE — Telephone Encounter (Signed)
 LVM informing patient her patient assistance medication for Ozempic  is here and ready for pick up. Informed her to give a call back if she had further questions.

## 2023-11-26 NOTE — Progress Notes (Signed)
 Diagnosis: Iron  Deficiency Anemia  Provider:  Praveen Mannam MD  Procedure: IV Push  IV Type: Peripheral, IV Location: R Forearm  Venofer  (Iron  Sucrose), Dose: 200 mg  Post Infusion IV Care: Peripheral IV Discontinued Per patient request patient completed 5 minutes of observation period  Discharge: Condition: Good, Destination: Home . AVS Declined  Performed by:  Eleanor DELENA Bloch, RN

## 2023-11-28 ENCOUNTER — Ambulatory Visit

## 2023-11-29 DIAGNOSIS — Z794 Long term (current) use of insulin: Secondary | ICD-10-CM | POA: Diagnosis not present

## 2023-11-29 DIAGNOSIS — Z89422 Acquired absence of other left toe(s): Secondary | ICD-10-CM | POA: Diagnosis not present

## 2023-11-29 DIAGNOSIS — E1151 Type 2 diabetes mellitus with diabetic peripheral angiopathy without gangrene: Secondary | ICD-10-CM | POA: Diagnosis not present

## 2023-11-29 DIAGNOSIS — Z8631 Personal history of diabetic foot ulcer: Secondary | ICD-10-CM | POA: Diagnosis not present

## 2023-12-04 ENCOUNTER — Ambulatory Visit

## 2023-12-04 ENCOUNTER — Ambulatory Visit: Attending: Cardiology | Admitting: Cardiology

## 2023-12-04 ENCOUNTER — Encounter: Payer: Self-pay | Admitting: Cardiology

## 2023-12-04 VITALS — BP 166/70 | HR 62 | Ht 65.0 in | Wt 176.6 lb

## 2023-12-04 DIAGNOSIS — I4819 Other persistent atrial fibrillation: Secondary | ICD-10-CM | POA: Diagnosis not present

## 2023-12-04 DIAGNOSIS — E1151 Type 2 diabetes mellitus with diabetic peripheral angiopathy without gangrene: Secondary | ICD-10-CM | POA: Diagnosis not present

## 2023-12-04 DIAGNOSIS — I739 Peripheral vascular disease, unspecified: Secondary | ICD-10-CM | POA: Diagnosis not present

## 2023-12-04 DIAGNOSIS — T82898D Other specified complication of vascular prosthetic devices, implants and grafts, subsequent encounter: Secondary | ICD-10-CM | POA: Diagnosis not present

## 2023-12-04 DIAGNOSIS — D6869 Other thrombophilia: Secondary | ICD-10-CM

## 2023-12-04 DIAGNOSIS — I342 Nonrheumatic mitral (valve) stenosis: Secondary | ICD-10-CM

## 2023-12-04 NOTE — Progress Notes (Signed)
 Cardiology Office Note:  .   Date:  12/04/2023  ID:  Theresa Harrington, DOB [birth year], MRN [id number] PCP: Joshua Debby CROME, MD  [facility] Providers Cardiologist:  Oneil Parchment, MD Electrophysiologist:  Will Gladis Norton, MD     History of Present Illness: Theresa Harrington   Theresa Harrington is a 68 y.o. female Discussed the use of AI scribe software for clinical note transcription with the patient, who gave verbal consent to proceed.  History of Present Illness Theresa Harrington is a 68 year old female with persistent atrial fibrillation and coronary artery disease who presents for follow-up.  She has a history of persistent atrial fibrillation with a CHADS-VASc score of four. She underwent an atrial fibrillation ablation on August 02, 2021, and a successful DC cardioversion on November 05, 2023. She was in sinus rhythm at her last atrial fibrillation clinic appointment post-cardioversion and felt better in normal rhythm. Her PR interval was 166 milliseconds, and her QTc in normal sinus rhythm is prohibitive of Tikosyn. She is currently taking Xarelto  with no missed doses and no bleeding issues.  She has coronary artery disease with a calcium  score of 704 but no chest pain. She also has peripheral arterial disease and underwent a femoral-popliteal bypass in 2016. She is followed by vascular specialists and has mild to moderate mitral stenosis. She is scheduled to undergo an abdominal aortogram and peripheral vascular study on December 11, 2023. In the past, she has had a left endarterectomy, common femoral artery endarterectomy with Dacron patch angioplasty, and left femoral to popliteal bypass in 2016 for a nonhealing toe ulcer. She also underwent a right iliofemoral endarterectomy with vein patch angioplasty and right femoral to popliteal artery bypass with composite graft for a great toe ulcer on the right in November 2024. She has right lower extremity edema managed with compression.  No chest pain or major shortness of  breath. Her blood pressure has been running well at the infusion center. She is on losartan , hydralazine , diltiazem , and Bystolic  for blood pressure management. She also takes Zetia , Crestor , and aspirin  for cholesterol management. Her hemoglobin is 11.1, hemoglobin A1c is 8.9, LDL is 38, creatinine is 0.9, ALT is 9, and TSH is 0.7.      ROS: No CP  Studies Reviewed: .        Results LABS Hemoglobin: 11.1 Hemoglobin A1c: 8.9% LDL: 38 mg/dL Creatinine: 0.9 mg/dL ALT: 9 U/L TSH: 0.7 mIU/L  DIAGNOSTIC Echocardiogram: Normal left ventricular function, mild to moderate mitral valve stenosis, mean gradient 5 mmHg, dilated left atrium (09/26/2023) Risk Assessment/Calculations:           Physical Exam:   VS:  BP (!) 166/70 (BP Location: Left Arm, Patient Position: Sitting, Cuff Size: Normal)   Pulse 62   Ht 5' 5 (1.651 m)   Wt 176 lb 9.6 oz (80.1 kg)   LMP  (LMP Unknown)   SpO2 96%   BMI 29.39 kg/m    Wt Readings from Last 3 Encounters:  12/04/23 176 lb 9.6 oz (80.1 kg)  11/26/23 176 lb 3.2 oz (79.9 kg)  11/23/23 174 lb 6.4 oz (79.1 kg)    GEN: Well nourished, well developed in no acute distress NECK: No JVD; No carotid bruits CARDIAC: RRR, soft systolic murmur, no rubs, no gallops RESPIRATORY:  Clear to auscultation without rales, wheezing or rhonchi  ABDOMEN: Soft, non-tender, non-distended EXTREMITIES:  No edema; No deformity   ASSESSMENT AND PLAN: .    Assessment and Plan Assessment & Plan  Persistent atrial fibrillation status post ablation and cardioversion, on anticoagulation Persistent atrial fibrillation with successful ablation and DC cardioversion. Currently in sinus rhythm. QTc in normal sinus rhythm is prohibitive of Tikosyn use. - Continue Xarelto   Peripheral arterial disease with right femoral-popliteal bypass (threatened graft) and chronic left femoral-popliteal bypass occlusion Right femoral-popliteal bypass with significantly elevated velocity at  distal anastomosis, indicating a threatened graft. Chronic occlusion of left femoral-popliteal bypass. Scheduled for abdominal aortogram and peripheral vascular study to assess bypass status. - Hold Xarelto  for two days prior to aortogram - Proceed with abdominal aortogram and peripheral vascular study on 12/11/2023, Dr. Serene  Coronary artery disease without angina Coronary artery disease with a calcium  score of 704. No current chest pain or angina reported.  Mild to moderate nonrheumatic mitral stenosis Mild to moderate mitral stenosis with a mean gradient of 5 mmHg. Normal pump function and dilated left atrial size. No immediate intervention required. - Continue periodic monitoring with echocardiogram  Type 2 diabetes mellitus with hypertension Type 2 diabetes with hemoglobin A1c of 8.9, indicating need for tighter glycemic control. Blood pressure slightly elevated today but generally well-controlled with current medication regimen. - Continue current antihypertensive regimen (can message with correct dose at home of meds) - Also BP normal at infusions - Tighten diabetic control  Right lower extremity edema Right lower extremity edema managed with compression.         Dispo: 1 yr  Signed, Oneil Parchment, MD

## 2023-12-04 NOTE — Patient Instructions (Signed)
 Medication Instructions:  Your physician recommends that you continue on your current medications as directed. Please refer to the Current Medication list given to you today.   Lab Work: NONE ordered at this time of appointment   Testing/Procedures: NONE ordered at this time of appointment   Follow-Up: At [facility], you and your health needs are our priority.  As part of our continuing mission to provide you with exceptional heart care, our providers are all part of one team.  This team includes your primary Cardiologist (physician) and Advanced Practice Providers or APPs (Physician Assistants and Nurse Practitioners) who all work together to provide you with the care you need, when you need it.  Your next appointment:   1 year(s)  Provider:   Oneil Parchment, MD    We recommend signing up for the patient portal called MyChart.  Sign up information is provided on this After Visit Summary.  MyChart is used to connect with patients for Virtual Visits (Telemedicine).  Patients are able to view lab/test results, encounter notes, upcoming appointments, etc.  Non-urgent messages can be sent to your provider as well.   To learn more about what you can do with MyChart, go to [email or web address].

## 2023-12-05 ENCOUNTER — Other Ambulatory Visit: Payer: Self-pay | Admitting: Internal Medicine

## 2023-12-05 ENCOUNTER — Ambulatory Visit

## 2023-12-05 VITALS — BP 172/71 | HR 58 | Temp 97.5°F | Resp 18 | Ht 65.0 in | Wt 176.0 lb

## 2023-12-05 DIAGNOSIS — D509 Iron deficiency anemia, unspecified: Secondary | ICD-10-CM | POA: Diagnosis not present

## 2023-12-05 DIAGNOSIS — E785 Hyperlipidemia, unspecified: Secondary | ICD-10-CM

## 2023-12-05 MED ORDER — IRON SUCROSE 20 MG/ML IV SOLN
200.0000 mg | Freq: Once | INTRAVENOUS | Status: AC
Start: 2023-12-05 — End: 2023-12-05
  Administered 2023-12-05: 200 mg via INTRAVENOUS
  Filled 2023-12-05: qty 10

## 2023-12-05 NOTE — Progress Notes (Signed)
 Diagnosis: Iron  Deficiency Anemia  Provider:  Praveen Mannam MD  Procedure: IV Push  IV Type: Peripheral, IV Location: R Antecubital  Venofer  (Iron  Sucrose), Dose: 200 mg  Post Infusion IV Care: Patient declined observation and Peripheral IV Discontinued  Discharge: Condition: Good, Destination: Home . AVS Declined  Performed by:  Keeara Frees, RN

## 2023-12-06 NOTE — Telephone Encounter (Signed)
 Last OV 11/07/23 Next OV not scheduled MWV 10/15/24  Last refill 07/30/23 Qty #90/0

## 2023-12-10 ENCOUNTER — Telehealth: Payer: Self-pay

## 2023-12-10 NOTE — Telephone Encounter (Signed)
 Pt called to r/s AGM for tomorrow as she is not feeling well. This has been r/s for 12/25/23.

## 2023-12-13 ENCOUNTER — Encounter: Payer: Self-pay | Admitting: Internal Medicine

## 2023-12-13 ENCOUNTER — Ambulatory Visit (INDEPENDENT_AMBULATORY_CARE_PROVIDER_SITE_OTHER): Admitting: Internal Medicine

## 2023-12-13 ENCOUNTER — Telehealth: Payer: Self-pay | Admitting: Pharmacist

## 2023-12-13 VITALS — BP 160/90 | HR 60 | Temp 97.7°F | Resp 16 | Ht 65.0 in | Wt 170.6 lb

## 2023-12-13 DIAGNOSIS — G63 Polyneuropathy in diseases classified elsewhere: Secondary | ICD-10-CM | POA: Diagnosis not present

## 2023-12-13 DIAGNOSIS — I10 Essential (primary) hypertension: Secondary | ICD-10-CM | POA: Diagnosis not present

## 2023-12-13 DIAGNOSIS — E538 Deficiency of other specified B group vitamins: Secondary | ICD-10-CM | POA: Diagnosis not present

## 2023-12-13 DIAGNOSIS — N182 Chronic kidney disease, stage 2 (mild): Secondary | ICD-10-CM | POA: Diagnosis not present

## 2023-12-13 MED ORDER — CYANOCOBALAMIN 1000 MCG/ML IJ SOLN
1000.0000 ug | Freq: Once | INTRAMUSCULAR | Status: AC
  Administered 2023-12-13: 1000 ug via INTRAMUSCULAR

## 2023-12-13 MED ORDER — TORSEMIDE 20 MG PO TABS: 20.0000 mg | ORAL_TABLET | Freq: Every day | ORAL | 0 refills | Status: DC

## 2023-12-13 NOTE — Patient Instructions (Signed)
 Hypertension, Adult High blood pressure (hypertension) is when the force of blood pumping through the arteries is too strong. The arteries are the blood vessels that carry blood from the heart throughout the body. Hypertension forces the heart to work harder to pump blood and may cause arteries to become narrow or stiff. Untreated or uncontrolled hypertension can lead to a heart attack, heart failure, a stroke, kidney disease, and other problems. A blood pressure reading consists of a higher number over a lower number. Ideally, your blood pressure should be below 120/80. The first ("top") number is called the systolic pressure. It is a measure of the pressure in your arteries as your heart beats. The second ("bottom") number is called the diastolic pressure. It is a measure of the pressure in your arteries as the heart relaxes. What are the causes? The exact cause of this condition is not known. There are some conditions that result in high blood pressure. What increases the risk? Certain factors may make you more likely to develop high blood pressure. Some of these risk factors are under your control, including: Smoking. Not getting enough exercise or physical activity. Being overweight. Having too much fat, sugar, calories, or salt (sodium) in your diet. Drinking too much alcohol. Other risk factors include: Having a personal history of heart disease, diabetes, high cholesterol, or kidney disease. Stress. Having a family history of high blood pressure and high cholesterol. Having obstructive sleep apnea. Age. The risk increases with age. What are the signs or symptoms? High blood pressure may not cause symptoms. Very high blood pressure (hypertensive crisis) may cause: Headache. Fast or irregular heartbeats (palpitations). Shortness of breath. Nosebleed. Nausea and vomiting. Vision changes. Severe chest pain, dizziness, and seizures. How is this diagnosed? This condition is diagnosed by  measuring your blood pressure while you are seated, with your arm resting on a flat surface, your legs uncrossed, and your feet flat on the floor. The cuff of the blood pressure monitor will be placed directly against the skin of your upper arm at the level of your heart. Blood pressure should be measured at least twice using the same arm. Certain conditions can cause a difference in blood pressure between your right and left arms. If you have a high blood pressure reading during one visit or you have normal blood pressure with other risk factors, you may be asked to: Return on a different day to have your blood pressure checked again. Monitor your blood pressure at home for 1 week or longer. If you are diagnosed with hypertension, you may have other blood or imaging tests to help your health care provider understand your overall risk for other conditions. How is this treated? This condition is treated by making healthy lifestyle changes, such as eating healthy foods, exercising more, and reducing your alcohol intake. You may be referred for counseling on a healthy diet and physical activity. Your health care provider may prescribe medicine if lifestyle changes are not enough to get your blood pressure under control and if: Your systolic blood pressure is above 130. Your diastolic blood pressure is above 80. Your personal target blood pressure may vary depending on your medical conditions, your age, and other factors. Follow these instructions at home: Eating and drinking  Eat a diet that is high in fiber and potassium, and low in sodium, added sugar, and fat. An example of this eating plan is called the DASH diet. DASH stands for Dietary Approaches to Stop Hypertension. To eat this way: Eat  plenty of fresh fruits and vegetables. Try to fill one half of your plate at each meal with fruits and vegetables. Eat whole grains, such as whole-wheat pasta, brown rice, or whole-grain bread. Fill about one  fourth of your plate with whole grains. Eat or drink low-fat dairy products, such as skim milk or low-fat yogurt. Avoid fatty cuts of meat, processed or cured meats, and poultry with skin. Fill about one fourth of your plate with lean proteins, such as fish, chicken without skin, beans, eggs, or tofu. Avoid pre-made and processed foods. These tend to be higher in sodium, added sugar, and fat. Reduce your daily sodium intake. Many people with hypertension should eat less than 1,500 mg of sodium a day. Do not drink alcohol if: Your health care provider tells you not to drink. You are pregnant, may be pregnant, or are planning to become pregnant. If you drink alcohol: Limit how much you have to: 0-1 drink a day for women. 0-2 drinks a day for men. Know how much alcohol is in your drink. In the [place] one drink equals one 12 oz bottle of beer (355 mL), one 5 oz glass of wine (148 mL), or one 1 oz glass of hard liquor (44 mL). Lifestyle  Work with your health care provider to maintain a healthy body weight or to lose weight. Ask what an ideal weight is for you. Get at least 30 minutes of exercise that causes your heart to beat faster (aerobic exercise) most days of the week. Activities may include walking, swimming, or biking. Include exercise to strengthen your muscles (resistance exercise), such as Pilates or lifting weights, as part of your weekly exercise routine. Try to do these types of exercises for 30 minutes at least 3 days a week. Do not use any products that contain nicotine or tobacco. These products include cigarettes, chewing tobacco, and vaping devices, such as e-cigarettes. If you need help quitting, ask your health care provider. Monitor your blood pressure at home as told by your health care provider. Keep all follow-up visits. This is important. Medicines Take over-the-counter and prescription medicines only as told by your health care provider. Follow directions carefully. Blood  pressure medicines must be taken as prescribed. Do not skip doses of blood pressure medicine. Doing this puts you at risk for problems and can make the medicine less effective. Ask your health care provider about side effects or reactions to medicines that you should watch for. Contact a health care provider if you: Think you are having a reaction to a medicine you are taking. Have headaches that keep coming back (recurring). Feel dizzy. Have swelling in your ankles. Have trouble with your vision. Get help right away if you: Develop a severe headache or confusion. Have unusual weakness or numbness. Feel faint. Have severe pain in your chest or abdomen. Vomit repeatedly. Have trouble breathing. These symptoms may be an emergency. Get help right away. Call 911. Do not wait to see if the symptoms will go away. Do not drive yourself to the hospital. Summary Hypertension is when the force of blood pumping through your arteries is too strong. If this condition is not controlled, it may put you at risk for serious complications. Your personal target blood pressure may vary depending on your medical conditions, your age, and other factors. For most people, a normal blood pressure is less than 120/80. Hypertension is treated with lifestyle changes, medicines, or a combination of both. Lifestyle changes include losing weight, eating a healthy,  low-sodium diet, exercising more, and limiting alcohol. This information is not intended to replace advice given to you by your health care provider. Make sure you discuss any questions you have with your health care provider. Document Revised: 02/22/2021 Document Reviewed: 02/22/2021 Elsevier Patient Education  2024 [employer].

## 2023-12-13 NOTE — Telephone Encounter (Signed)
 Pt in office today and notes needing assistance with Xarelto . She believes she does meet income cut off for Xarelto  PAP. Will initiate Xarelto  PAP

## 2023-12-13 NOTE — Progress Notes (Signed)
 Subjective:  Patient ID: Theresa Harrington, female    DOB: 10/20/1955  Age: 68 y.o. MRN: [id number]  CC: Hypertension (Patient states that her blood pressure has been running in the 160-170s/60s and it got to the 190s/80s ) and Anemia   HPI Theresa Harrington presents for f/up ----  Discussed the use of AI scribe software for clinical note transcription with the patient, who gave verbal consent to proceed.  History of Present Illness Theresa Harrington is a 68 year old female with hypertension who presents for a follow-up visit.  She has been experiencing elevated blood pressure readings despite being on hydralazine . Hydralazine  was initially prescribed three times a day but was reduced to twice a day by another provider. She takes it inconsistently, only when she remembers, as she does not feel unwell. No headache, blurred vision, chest pain, shortness of breath, dizziness, or lightheadedness.  Her blood sugar levels have been higher than usual in the mornings for the past two days, with readings of 120 and 174, whereas they typically range from 90 to 110. No excessive thirst, excessive urination, drastic changes in weight or appetite, or painful urination. However, she reports nocturia, getting up a couple of times at night to use the bathroom.  She has a history of a successful cardioversion for an unspecified cardiac issue and reports no current issues with heart rate. She previously used torasemide but discontinued it due to peripheral edema. She is concerned about a scheduled arteriogram, which she missed due to fear of the results, but plans to attend the rescheduled appointment. She reports that she has been walking frequently without issues and does not experience pain while walking. No leg swelling, chest pain, or shortness of breath during physical activity.      Outpatient Medications Prior to Visit  Medication Sig Dispense Refill   aspirin  EC 325 MG tablet Take 325 mg by mouth daily.     Blood  Glucose Monitoring Suppl (ONETOUCH VERIO IQ SYSTEM) w/Device KIT USE TO CHECK SUGAR DAILY 1 kit 0   Continuous Glucose Receiver (FREESTYLE LIBRE 3 READER) DEVI Use to check blood glucose levels with Freestyle Libre 3 Plus sensors. 1 each 0   Continuous Glucose Sensor (FREESTYLE LIBRE 3 PLUS SENSOR) MISC Change sensor every 15 days. 2 each 5   diltiazem  (CARDIZEM  CD) 120 MG 24 hr capsule Take 1 capsule (120 mg total) by mouth daily. 90 capsule 0   diltiazem  (CARDIZEM ) 30 MG tablet Take 30 mg by mouth every 4 (four) hours as needed (a-fib).     dorzolamide -timolol  (COSOPT ) 22.3-6.8 MG/ML ophthalmic solution Place 1 drop into the right eye 2 (two) times daily.      ezetimibe  (ZETIA ) 10 MG tablet Take 1 tablet by mouth once daily 90 tablet 0   hydrALAZINE  (APRESOLINE ) 100 MG tablet Take 1 tablet (100 mg total) by mouth in the morning and at bedtime.     Insulin  Glargine (BASAGLAR  KWIKPEN) 100 UNIT/ML INJECT 50 UNITS UNDER THE SKIN DAILY 30 mL 5   insulin  lispro (HUMALOG  KWIKPEN) 100 UNIT/ML KwikPen Inject 14 Units into the skin with breakfast, with lunch, and with evening meal. DIAL  14 UNITS AND INJECT UNDER THE SKIN THREE TIMES A DAY. 45 mL 0   Insulin  Pen Needle (PEN NEEDLES) 32G X 4 MM MISC Inject 1 pen  as directed 4 (four) times daily. Use to inject levemir  or novolog  as directed. 400 each 3   latanoprost  (XALATAN ) 0.005 % ophthalmic solution Place 1 drop into both  eyes at bedtime.      losartan  (COZAAR ) 100 MG tablet Take 100 mg by mouth daily.     MAGNESIUM -OXIDE 400 (240 Mg) MG tablet Take 1 tablet by mouth once daily 90 tablet 0   nebivolol  (BYSTOLIC ) 10 MG tablet Take 1 tablet by mouth once daily 90 tablet 3   pantoprazole  (PROTONIX ) 40 MG tablet Take 1 tablet by mouth twice daily 180 tablet 0   rivaroxaban  (XARELTO ) 20 MG TABS tablet TAKE 1 TABLET BY MOUTH ONCE DAILY 90 tablet 0   rosuvastatin  (CRESTOR ) 5 MG tablet Take 1 tablet by mouth once daily 90 tablet 1   Semaglutide , 1 MG/DOSE,  (OZEMPIC , 1 MG/DOSE,) 4 MG/3ML SOPN Inject 1 mg into the skin once a week. Via [employer] patient assistance 9 mL 1   SYNJARDY  5-500 MG TABS TAKE 1 TABLET BY MOUTH TWICE DAILY 180 tablet 1   ONETOUCH VERIO test strip USE TO check blood glucose TWICE DAILY 200 strip 3   aspirin  EC 81 MG tablet Take 1 tablet (81 mg total) by mouth daily. Swallow whole. (Patient not taking: Reported on 12/10/2023)     lubiprostone  (AMITIZA ) 24 MCG capsule Take 1 capsule (24 mcg total) by mouth 2 (two) times daily with a meal. (Patient not taking: No sig reported) 180 capsule 1   torsemide  (DEMADEX ) 10 MG tablet Take 1 tablet (10 mg total) by mouth daily. (Patient not taking: No sig reported) 90 tablet 0   No facility-administered medications prior to visit.    ROS Review of Systems  Constitutional:  Negative for appetite change, chills, diaphoresis and fatigue.  HENT: Negative.    Eyes: Negative.   Respiratory: Negative.  Negative for cough, chest tightness, shortness of breath and wheezing.   Cardiovascular:  Negative for chest pain, palpitations and leg swelling.  Gastrointestinal:  Negative for abdominal pain, constipation, diarrhea, nausea and vomiting.  Genitourinary: Negative.  Negative for difficulty urinating and dysuria.  Musculoskeletal: Negative.  Negative for myalgias.  Skin: Negative.   Neurological:  Negative for dizziness and weakness.  Hematological:  Negative for adenopathy. Does not bruise/bleed easily.  Psychiatric/Behavioral: Negative.      Objective:  BP (!) 160/90 (BP Location: Left Arm, Patient Position: Sitting, Cuff Size: Normal)   Pulse 60   Temp 97.7 F (36.5 C) (Oral)   Resp 16   Ht 5' 5 (1.651 m)   Wt 170 lb 9.6 oz (77.4 kg)   LMP  (LMP Unknown)   SpO2 97%   BMI 28.39 kg/m   BP Readings from Last 3 Encounters:  12/13/23 (!) 160/90  12/05/23 (!) 172/71  12/04/23 (!) 166/70    Wt Readings from Last 3 Encounters:  12/13/23 170 lb 9.6 oz (77.4 kg)  12/05/23 176 lb  (79.8 kg)  12/04/23 176 lb 9.6 oz (80.1 kg)    Physical Exam Vitals reviewed.  HENT:     Mouth/Throat:     Mouth: Mucous membranes are moist.  Eyes:     General: No scleral icterus.    Conjunctiva/sclera: Conjunctivae normal.  Cardiovascular:     Rate and Rhythm: Normal rate and regular rhythm.     Heart sounds: Murmur heard.     Systolic murmur is present with a grade of 1/6.     No friction rub. No gallop.  Pulmonary:     Effort: Pulmonary effort is normal.     Breath sounds: No stridor. No wheezing, rhonchi or rales.  Abdominal:     General: Abdomen  is flat.     Palpations: There is no mass.     Tenderness: There is no abdominal tenderness. There is no guarding.     Hernia: No hernia is present.  Musculoskeletal:     Cervical back: No tenderness.     Right lower leg: No edema.     Left lower leg: No edema.  Lymphadenopathy:     Cervical: No cervical adenopathy.  Skin:    General: Skin is warm and dry.  Neurological:     General: No focal deficit present.     Mental Status: She is alert. Mental status is at baseline.  Psychiatric:        Mood and Affect: Mood normal.        Behavior: Behavior normal.     Lab Results  Component Value Date   WBC 11.0 (H) 10/12/2023   HGB 11.1 (L) 10/12/2023   HCT 37.2 10/12/2023   PLT 374 10/12/2023   GLUCOSE 139 (H) 10/12/2023   CHOL 118 10/08/2023   TRIG 164.0 (H) 10/08/2023   HDL 46.60 10/08/2023   LDLDIRECT 160.2 03/01/2010   LDLCALC 38 10/08/2023   ALT 9 10/08/2023   AST 9 10/08/2023   NA 141 10/12/2023   K 4.4 10/12/2023   CL 106 10/12/2023   CREATININE 0.94 10/12/2023   BUN 20 10/12/2023   CO2 24 10/12/2023   TSH 0.70 10/12/2023   INR 1.0 03/09/2023   HGBA1C 8.9 (H) 10/12/2023   MICROALBUR 1.4 10/12/2023    No results found.  Assessment & Plan:   Essential hypertension, benign- She has not achieved her BP goal. Will restart the loop diuretic. -     Torsemide ; Take 1 tablet (20 mg total) by mouth daily.   Dispense: 90 tablet; Refill: 0  Chronic renal disease, stage 2, mildly decreased glomerular filtration rate (GFR) between 60-89 mL/min/1.73 square meter -     Torsemide ; Take 1 tablet (20 mg total) by mouth daily.  Dispense: 90 tablet; Refill: 0  Vitamin B12 deficiency neuropathy (HCC) -     Cyanocobalamin      Follow-up: Return in about 3 months (around 03/14/2024).  Debby Molt, MD

## 2023-12-14 ENCOUNTER — Other Ambulatory Visit: Admitting: Pharmacist

## 2023-12-14 DIAGNOSIS — Z794 Long term (current) use of insulin: Secondary | ICD-10-CM

## 2023-12-14 DIAGNOSIS — E1159 Type 2 diabetes mellitus with other circulatory complications: Secondary | ICD-10-CM

## 2023-12-14 DIAGNOSIS — Z7984 Long term (current) use of oral hypoglycemic drugs: Secondary | ICD-10-CM

## 2023-12-14 DIAGNOSIS — Z7985 Long-term (current) use of injectable non-insulin antidiabetic drugs: Secondary | ICD-10-CM

## 2023-12-14 MED ORDER — ONETOUCH VERIO VI STRP: ORAL_STRIP | 3 refills | Status: DC

## 2023-12-14 NOTE — Progress Notes (Signed)
   12/14/23  Name: Theresa Harrington MRN: [id number] DOB: 1955/06/28  Chief Complaint  Patient presents with   Diabetes   Medication Management   Medication Assistance    Theresa Harrington is a 68 y.o. year old female who presented for a telephone visit.   They were referred to the pharmacist by their PCP for assistance in managing diabetes.    Subjective:  Care Team: Primary Care Provider: Joshua Debby CROME, MD ; Next Scheduled Visit: not scheduled  Medication Access/Adherence  Current Pharmacy:  [facility] [address], [place] - [address] [facility] [place] [address] Phone: [phone] Fax: [phone]  [facility] - [place], [place] - [address] [place] [address] Phone: [phone] Fax: [phone]  [facility] [address] [place] [address] Phone: [phone] Fax: [phone]  [facility] [place], [place] - [phone] [place] [address] Phone: [phone] Fax: [phone]  [facility] - [place], [place] - [address] [place] [address] Phone: [phone] Fax: [phone]   Patient reports affordability concerns with their medications: No  Patient reports access/transportation concerns to their pharmacy: No  Patient reports adherence concerns with their medications:  No     Diabetes:  Current medications: Basaglar  50 units daily, Humalog  14 units TID AC, Ozempic  1 mg weekly, Synjardy  5/500 mg BID   Current glucose readings: BG 125-140 in the AM Using glucometer; testing 1 time per day She used CGM at one point but notes insurance stopped covering it She is willing to use CGM again if insurance covers it - she picked up the [employer] sensors but has not been able to set up the app yet  Patient denies hypoglycemic s/sx including dizziness, shakiness, sweating. Patient  denies hyperglycemic symptoms including polyuria, polydipsia, polyphagia, nocturia, neuropathy, blurred vision.  Current meal patterns:  She tends to eat around 8 PM and then lays down at 9 PM  Current physical activity: some walking but has been taking it easy lately due to afib issues  Current medication access support: PAP through [employer], [facility], and [employer]   Objective:  Lab Results  Component Value Date   HGBA1C 8.9 (H) 10/12/2023    Lab Results  Component Value Date   CREATININE 0.94 10/12/2023   BUN 20 10/12/2023   NA 141 10/12/2023   K 4.4 10/12/2023   CL 106 10/12/2023   CO2 24 10/12/2023    Lab Results  Component Value Date   CHOL 118 10/08/2023   HDL 46.60 10/08/2023   LDLCALC 38 10/08/2023   LDLDIRECT 160.2 03/01/2010   TRIG 164.0 (H) 10/08/2023   CHOLHDL 3 10/08/2023    Medications Reviewed Today   Medications were not reviewed in this encounter       Assessment/Plan:   Diabetes: - Currently uncontrolled, A1c goal <7% - Reviewed dietary modifications including balanced meals, increased protein/fiber - Reviewed lifestyle modifications including: increased walking and strength training - Recommend to continue current regimen and starting using CGM to get better idea of BG trends to better adjust medications - She has received the reader, will work on getting it set up - Check on Basaglar  and Ozempic  status    Follow Up Plan: 9/5  Theresa Harrington, PharmD, BCPS, CPP Clinical Pharmacist Practitioner [facility] [phone]

## 2023-12-18 ENCOUNTER — Telehealth: Payer: Self-pay | Admitting: Pharmacy Technician

## 2023-12-18 NOTE — Progress Notes (Signed)
   12/18/2023 Name: Theresa Harrington MRN: [id number] DOB: Dec 12, 1955  Patient is appearing on a report for [employer] [place] [employer] Diabetes and last engaged with the clinical pharmacist to discuss diabetes on 12/14/2023. Contacted patient today to discuss diabetes management and completed medication review.   Diabetes Plan from last clinical pharmacist appointment:  Diabetes: - Currently uncontrolled, A1c goal <7% - Reviewed dietary modifications including balanced meals, increased protein/fiber - Reviewed lifestyle modifications including: increased walking and strength training - Recommend to continue current regimen and starting using CGM to get better idea of BG trends to better adjust medications - She has received the reader, will work on getting it set up - Check on Basaglar  and Ozempic  status -Follow Up Plan: 9/5 Medication Adherence Barriers Identified:  Access issues with any new medication or testing device: Yes Ozempic  and Basaglar    Medication Adherence Barriers Addressed/Actions Taken:  Reviewed medication changes per plan from last clinical pharmacist note Medication Access for Ozempic  and Basaglar  Will discuss medication access concerns with pharmacist Contacted pharmacy regarding new prescriptions  Successful outreach to patient. HIPAA verified. Patient informs she has not picked up Ozempic  that was delivered on 11/26/23 to the office. Informed patent CMA left voicemail on 11/26/23. She informs she did not get the message and rarely checks her messages. She informs she prefers a text message. Informed patient the medication was at the office and she informs she will get by this week and pick up the Ozempic . Inquired how much Basaglar  the patient had and she informs she is on her last pen. Inquired when she last received a shipment and she could not remember. Care coordination call placed to [employer]. Spoke to [facility] who verifies SIG and patient address. She informs 4 boxes or a 4 month supply  was delivered on 11/01/23 at 11am. The [employer] tracking number is [id number]. She informs next shipment will being processing on 01/29/24. She informs if patient wants to dispute receiving the package she can call [facility] at [phone] and choose option 2.  Called patient back and verified HIPAA. Patient was informed of this information and was provided a description of the area in which package was left. Patient verbalizes this sounds like her residence but that she does not have any additional medication other than the one pen. Provided patient phone number and option to choose to dispute delivery. She informs she will call. Reviewed instructions for monitoring blood sugars at home and reminded patient to keep a written log to review with pharmacist Reminded patient of date/time of upcoming clinical pharmacist follow up and any upcoming PCP/specialists visits. Patient denies transportation barriers to the appointment. Yes  Next clinical pharmacist appointment is scheduled for: 01/04/2024  Alexander Aument, CPhT [facility] Office: [phone] Email: [email or web address]

## 2023-12-23 ENCOUNTER — Other Ambulatory Visit: Payer: Self-pay | Admitting: Cardiology

## 2023-12-25 ENCOUNTER — Telehealth: Payer: Self-pay | Admitting: Pharmacist

## 2023-12-25 ENCOUNTER — Encounter (HOSPITAL_COMMUNITY)
Admission: RE | Disposition: A | Discharge status: Home / Self Care | Payer: Self-pay | Source: Home / Self Care | Attending: Surgery

## 2023-12-25 ENCOUNTER — Ambulatory Visit (HOSPITAL_COMMUNITY)
Admission: RE | Admit: 2023-12-25 | Discharge: 2023-12-25 | Disposition: A | Discharge status: Home / Self Care | Attending: Surgery | Admitting: Surgery

## 2023-12-25 DIAGNOSIS — L97909 Non-pressure chronic ulcer of unspecified part of unspecified lower leg with unspecified severity: Secondary | ICD-10-CM

## 2023-12-25 DIAGNOSIS — Z79899 Other long term (current) drug therapy: Secondary | ICD-10-CM | POA: Diagnosis not present

## 2023-12-25 DIAGNOSIS — L97519 Non-pressure chronic ulcer of other part of right foot with unspecified severity: Secondary | ICD-10-CM

## 2023-12-25 DIAGNOSIS — Z87891 Personal history of nicotine dependence: Secondary | ICD-10-CM

## 2023-12-25 DIAGNOSIS — I70249 Atherosclerosis of native arteries of left leg with ulceration of unspecified site: Secondary | ICD-10-CM

## 2023-12-25 DIAGNOSIS — Z794 Long term (current) use of insulin: Secondary | ICD-10-CM | POA: Diagnosis not present

## 2023-12-25 DIAGNOSIS — Z7985 Long-term (current) use of injectable non-insulin antidiabetic drugs: Secondary | ICD-10-CM | POA: Insufficient documentation

## 2023-12-25 DIAGNOSIS — E1151 Type 2 diabetes mellitus with diabetic peripheral angiopathy without gangrene: Secondary | ICD-10-CM | POA: Diagnosis not present

## 2023-12-25 DIAGNOSIS — I10 Essential (primary) hypertension: Secondary | ICD-10-CM | POA: Diagnosis not present

## 2023-12-25 DIAGNOSIS — Z7901 Long term (current) use of anticoagulants: Secondary | ICD-10-CM | POA: Diagnosis not present

## 2023-12-25 DIAGNOSIS — Y832 Surgical operation with anastomosis, bypass or graft as the cause of abnormal reaction of the patient, or of later complication, without mention of misadventure at the time of the procedure: Secondary | ICD-10-CM | POA: Insufficient documentation

## 2023-12-25 DIAGNOSIS — Z89422 Acquired absence of other left toe(s): Secondary | ICD-10-CM

## 2023-12-25 DIAGNOSIS — I739 Peripheral vascular disease, unspecified: Secondary | ICD-10-CM

## 2023-12-25 DIAGNOSIS — I70235 Atherosclerosis of native arteries of right leg with ulceration of other part of foot: Secondary | ICD-10-CM

## 2023-12-25 DIAGNOSIS — I70239 Atherosclerosis of native arteries of right leg with ulceration of unspecified site: Secondary | ICD-10-CM | POA: Diagnosis not present

## 2023-12-25 DIAGNOSIS — T82898A Other specified complication of vascular prosthetic devices, implants and grafts, initial encounter: Secondary | ICD-10-CM | POA: Diagnosis not present

## 2023-12-25 DIAGNOSIS — T82858A Stenosis of vascular prosthetic devices, implants and grafts, initial encounter: Secondary | ICD-10-CM | POA: Diagnosis not present

## 2023-12-25 DIAGNOSIS — Z7982 Long term (current) use of aspirin: Secondary | ICD-10-CM | POA: Diagnosis not present

## 2023-12-25 DIAGNOSIS — Z95828 Presence of other vascular implants and grafts: Secondary | ICD-10-CM

## 2023-12-25 DIAGNOSIS — Z9889 Other specified postprocedural states: Secondary | ICD-10-CM

## 2023-12-25 HISTORY — PX: LOWER EXTREMITY ANGIOGRAPHY: CATH118251

## 2023-12-25 HISTORY — PX: ABDOMINAL AORTOGRAM: CATH118222

## 2023-12-25 LAB — POCT I-STAT, CHEM 8
BUN: 11 mg/dL (ref 8–23)
Calcium, Ion: 1.17 mmol/L (ref 1.15–1.40)
Chloride: 105 mmol/L (ref 98–111)
Creatinine, Ser: 0.7 mg/dL (ref 0.44–1.00)
Glucose, Bld: 127 mg/dL — ABNORMAL HIGH (ref 70–99)
HCT: 38 % (ref 36.0–46.0)
Hemoglobin: 12.9 g/dL (ref 12.0–15.0)
Potassium: 3.4 mmol/L — ABNORMAL LOW (ref 3.5–5.1)
Sodium: 143 mmol/L (ref 135–145)
TCO2: 25 mmol/L (ref 22–32)

## 2023-12-25 LAB — GLUCOSE, CAPILLARY: Glucose-Capillary: 115 mg/dL — ABNORMAL HIGH (ref 70–99)

## 2023-12-25 SURGERY — ABDOMINAL AORTOGRAM
Anesthesia: LOCAL

## 2023-12-25 MED ORDER — MIDAZOLAM HCL 2 MG/2ML IJ SOLN
INTRAMUSCULAR | Status: DC | PRN
  Administered 2023-12-25: 2 mg via INTRAVENOUS
  Administered 2023-12-25: 1 mg via INTRAVENOUS

## 2023-12-25 MED ORDER — MIDAZOLAM HCL 2 MG/2ML IJ SOLN
INTRAMUSCULAR | Status: AC
  Filled 2023-12-25: qty 2

## 2023-12-25 MED ORDER — SODIUM CHLORIDE 0.9 % IV SOLN: INTRAVENOUS | Status: DC

## 2023-12-25 MED ORDER — LIDOCAINE HCL (PF) 1 % IJ SOLN
INTRAMUSCULAR | Status: DC | PRN
Start: 2023-12-25 — End: 2023-12-25
  Administered 2023-12-25: 15 mL

## 2023-12-25 MED ORDER — SODIUM CHLORIDE 0.9 % IV SOLN: 250.0000 mL | INTRAVENOUS | Status: DC | PRN

## 2023-12-25 MED ORDER — OXYCODONE HCL 5 MG PO TABS: 5.0000 mg | ORAL_TABLET | ORAL | Status: DC | PRN

## 2023-12-25 MED ORDER — ONDANSETRON HCL 4 MG/2ML IJ SOLN: 4.0000 mg | Freq: Four times a day (QID) | INTRAMUSCULAR | Status: DC | PRN

## 2023-12-25 MED ORDER — MIDAZOLAM HCL 2 MG/2ML IJ SOLN
INTRAMUSCULAR | Status: AC
Start: 2023-12-25 — End: 2023-12-25
  Filled 2023-12-25: qty 2

## 2023-12-25 MED ORDER — FENTANYL CITRATE (PF) 100 MCG/2ML IJ SOLN
INTRAMUSCULAR | Status: DC | PRN
  Administered 2023-12-25: 50 ug via INTRAVENOUS
  Administered 2023-12-25: 25 ug via INTRAVENOUS

## 2023-12-25 MED ORDER — ACETAMINOPHEN 325 MG PO TABS: 650.0000 mg | ORAL_TABLET | ORAL | Status: DC | PRN

## 2023-12-25 MED ORDER — HYDRALAZINE HCL 20 MG/ML IJ SOLN: 5.0000 mg | INTRAMUSCULAR | Status: DC | PRN

## 2023-12-25 MED ORDER — HEPARIN (PORCINE) IN NACL 1000-0.9 UT/500ML-% IV SOLN
INTRAVENOUS | Status: DC | PRN
  Administered 2023-12-25 (×2): 500 mL

## 2023-12-25 MED ORDER — FENTANYL CITRATE (PF) 100 MCG/2ML IJ SOLN
INTRAMUSCULAR | Status: AC
  Filled 2023-12-25: qty 2

## 2023-12-25 MED ORDER — LIDOCAINE HCL (PF) 1 % IJ SOLN
INTRAMUSCULAR | Status: AC
  Filled 2023-12-25: qty 30

## 2023-12-25 MED ORDER — SODIUM CHLORIDE 0.9% FLUSH: 3.0000 mL | INTRAVENOUS | Status: DC | PRN

## 2023-12-25 MED ORDER — SODIUM CHLORIDE 0.9% FLUSH: 3.0000 mL | Freq: Two times a day (BID) | INTRAVENOUS | Status: DC

## 2023-12-25 MED ORDER — LABETALOL HCL 5 MG/ML IV SOLN: 10.0000 mg | INTRAVENOUS | Status: DC | PRN

## 2023-12-25 MED ORDER — SODIUM CHLORIDE 0.9 % WEIGHT BASED INFUSION
1.0000 mL/kg/h | INTRAVENOUS | Status: DC
Start: 2023-12-25 — End: 2023-12-25

## 2023-12-25 SURGICAL SUPPLY — 10 items
CATH OMNI FLUSH 5F 65CM (CATHETERS) IMPLANT
CLOSURE MYNX CONTROL 5F (Vascular Products) IMPLANT
COVER DOME SNAP 22 D (MISCELLANEOUS) IMPLANT
KIT MICROPUNCTURE NIT STIFF (SHEATH) IMPLANT
KIT PREMIUM HAND CONTROLLER (KITS) IMPLANT
KIT SINGLE USE MANIFOLD (KITS) IMPLANT
SHEATH PINNACLE 5F 10CM (SHEATH) IMPLANT
SHEATH PROBE COVER 6X72 (BAG) IMPLANT
TRAY PV CATH (CUSTOM PROCEDURE TRAY) ×2 IMPLANT
WIRE BENTSON .035X145CM (WIRE) IMPLANT

## 2023-12-25 NOTE — Telephone Encounter (Signed)
 Received patient and provider signatures for Xarelto  PAP. Have faxed completed application to [employer].  Darrelyn Drum, PharmD, BCPS, CPP Clinical Pharmacist Practitioner [facility] [phone]

## 2023-12-25 NOTE — Discharge Instructions (Addendum)
 Restart Xaralto in am  Femoral Site Care This sheet gives you information about how to care for yourself after your procedure. Your health care provider may also give you more specific instructions. If you have problems or questions, contact your health care provider. What can I expect after the procedure?  After the procedure, it is common to have: Bruising that usually fades within 1-2 weeks. Tenderness at the site. Follow these instructions at home: Wound care Follow instructions from your health care provider about how to take care of your insertion site. Make sure you: Wash your hands with soap and water  before you change your bandage (dressing). If soap and water  are not available, use hand sanitizer. Remove your dressing as told by your health care provider. 24 hours Do not take baths, swim, or use a hot tub until your health care provider approves. You may shower 24-48 hours after the procedure or as told by your health care provider. Gently wash the site with plain soap and water . Pat the area dry with a clean towel. Do not rub the site. This may cause bleeding. Do not apply powder or lotion to the site. Keep the site clean and dry. Check your femoral site every day for signs of infection. Check for: Redness, swelling, or pain. Fluid or blood. Warmth. Pus or a bad smell. Activity For the first 2-3 days after your procedure, or as long as directed: Avoid climbing stairs as much as possible. Do not squat. Do not lift anything that is heavier than 10 lb (4.5 kg), or the limit that you are told, until your health care provider says that it is safe. For 5 days Rest as directed. Avoid sitting for a long time without moving. Get up to take short walks every 1-2 hours. Do not drive for 24 hours if you were given a medicine to help you relax (sedative). General instructions Take over-the-counter and prescription medicines only as told by your health care provider. Keep all  follow-up visits as told by your health care provider. This is important. Contact a health care provider if you have: A fever or chills. You have redness, swelling, or pain around your insertion site. Get help right away if: The catheter insertion area swells very fast. You pass out. You suddenly start to sweat or your skin gets clammy. The catheter insertion area is bleeding, and the bleeding does not stop when you hold steady pressure on the area. The area near or just beyond the catheter insertion site becomes pale, cool, tingly, or numb. These symptoms may represent a serious problem that is an emergency. Do not wait to see if the symptoms will go away. Get medical help right away. Call your local emergency services (911 in the [place] Do not drive yourself to the hospital. Summary After the procedure, it is common to have bruising that usually fades within 1-2 weeks. Check your femoral site every day for signs of infection. Do not lift anything that is heavier than 10 lb (4.5 kg), or the limit that you are told, until your health care provider says that it is safe. This information is not intended to replace advice given to you by your health care provider. Make sure you discuss any questions you have with your health care provider. Document Revised: 04/30/2017 Document Reviewed: 04/30/2017 Elsevier Patient Education  2020 [employer]

## 2023-12-25 NOTE — Op Note (Signed)
    Patient name: Theresa Harrington MRN: [id number] DOB: [birth year] Sex: female  12/25/2023 Pre-operative Diagnosis: Bypass graft stenosis Post-operative diagnosis:  Same Surgeon:  Malvina New Procedure Performed:  1.  Ultrasound-guided access, left common femoral artery  2.  Aortobifemoral angiogram  3.  Bilateral leg angiogram  4.   selective imaging with catheter in right external iliac artery (second-order  5.  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  The right had distal anastomotic stenosis at her last ultrasound.  She has no claudication symptoms, rest pain, and no active ulcers.  Procedure:  The patient was identified in the holding area and taken to room 8.  The patient was then placed supine on the table and prepped and draped in the usual sterile fashion.  A time out was called.  Conscious sedation was administered with the use of IV fentanyl  and Versed  under continuous physician and nurse monitoring.  Heart rate, blood pressure, and oxygen  saturation were continuously monitored.  Total sedation time was 25 minutes.  Ultrasound was used to evaluate the left common femoral artery.  It was patent .  A digital ultrasound image was acquired.  A micropuncture needle was used to access the left common femoral artery under ultrasound guidance.  An 018 wire was advanced without resistance and a micropuncture sheath was placed.  The 018 wire was removed and a benson wire was placed.  The micropuncture sheath was exchanged for a 5 french sheath.  An omniflush catheter was advanced over the wire to the level of L-1.  An abdominal angiogram was obtained.  Next, using the omniflush catheter and a benson wire, the aortic bifurcation was crossed and the catheter was placed into theright external iliac artery and right runoff was obtained.  left runoff was performed via  retrograde sheath injections.  The groin was closed with a Mynx  Findings:   Aortogram: No significant renal artery stenosis was visualized.  The infrarenal abdominal aorta is widely patent.  Bilateral common and external iliac arteries are widely patent.  Bilateral common femoral arteries are widely patent.  Right Lower Extremity: The right common femoral and profundofemoral artery are widely patent.  The bypass graft in the right leg is occluded as is the superficial femoral artery.  There is reconstitution of the posterior tibial artery which is the dominant vessel across the ankle  Left Lower Extremity: The left common femoral and profundofemoral artery are widely patent.  The left leg bypass and superficial femoral artery are occluded.  There is reconstitution of the posterior tibial artery which is a dominant vessel across the ankle  Intervention:  none  Impression:  #1  The right leg bypass graft has had a interval occlusion.  The patient does not have any active ulcers.  She is asymptomatic from a claudication perspective.  Therefore the decision was made not to attempt lysis.  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 Malvina New, M.D., [facility] [place] Office: [phone] Pager:  [phone]

## 2023-12-25 NOTE — Progress Notes (Signed)
Client up and walked and tolerated well; left groin stable, no bleeding or hematoma 

## 2023-12-25 NOTE — H&P (Signed)
 HISTORY AND PHYSICAL        CC:  follow up. Requesting Provider:  Joshua Debby CROME, MD   HPI: This is a 68 y.o. female who is here today for follow up for PAD.  Pt has hx of left EIA and CFA endarterectomy with Dacron patch angioplasty and left femoral to BK popliteal bypass with non reversed GSV on 06/08/2014 by Dr. Oris for non healing toe ulcer.   She was taken back to the operating room on 06/09/2014 for occluded bypass and underwent left femoral to BK popliteal artery bypass with PTFE, thrombectomy of left popliteal artery by Dr. Serene.   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 Serene.  On 03/09/2023, she underwent right iliofemoral endarterectomy with vein patch angioplasty, right femoral to PTA bypass with composite graft (6mm external ring PTFE proximally and saphenous vein to PTA by Dr. Serene for right great toe ulcer.    Her left leg bypass is chronically occluded.    Pt was last seen 05/14/2023 and at that time, all her incisions had healed.  She felt her great toe ulcer was nearly healed.  She did have some RLE edema that was managed with compression. She was not having rest pain or claudication. She had underwent lower endoscopy by GI for anemia.  Triple therapy was held however since then she has been restarted on Xarelto  and aspirin .  Plavix  was discontinued.     The pt returns today for follow up.  She states that she gets some claudication only with strenuous walks or an incline.  She denies any rest pain or non healing wounds.  She does not smoke.  She is compliant with her statin, asa and Xarelto .    The pt is on a statin for cholesterol management.    The pt is on an aspirin .    Other AC:  Xarelto  The pt is on CCB, BB for hypertension.  The pt is  on diabetic medication. Tobacco hx:  former   Pt does not have family hx of AAA.       Past Medical History:  Diagnosis Date   Allergy     Anxiety      Pt. denies   Arthritis      right index figer    Asymptomatic cholelithiasis     Atherosclerosis of aorta (HCC)     Atrial flutter, paroxysmal (HCC) 02/20/2023   Cataract     Clotting disorder (HCC)     Gallstones 06/2013   GERD (gastroesophageal reflux disease)     Glaucoma      A LITTLE BIT   History of kidney stones      lithotrispy   Hyperlipidemia     Hypertension     Hypothyroidism      no meds   Iron  deficiency anemia     PAD (peripheral artery disease) (HCC)     PAF (paroxysmal atrial fibrillation) (HCC)     Peripheral arterial occlusive disease (HCC)      lower extremities   Pneumonia     PONV (postoperative nausea and vomiting)     Right ureteral stone     Type 2 diabetes mellitus (HCC)      Type   Vitamin B 12 deficiency     Wears glasses                 Past Surgical History:  Procedure Laterality Date   ABDOMINAL AORTOGRAM W/LOWER EXTREMITY N/A 03/09/2020  Procedure: ABDOMINAL AORTOGRAM W/LOWER EXTREMITY;  Surgeon: Serene Gaile ORN, MD;  Location: [facility];  Service: Cardiovascular;  Laterality: N/A;   ABDOMINAL AORTOGRAM W/LOWER EXTREMITY N/A 03/06/2023    Procedure: ABDOMINAL AORTOGRAM W/LOWER EXTREMITY;  Surgeon: Serene Gaile ORN, MD;  Location: [facility];  Service: Cardiovascular;  Laterality: N/A;   AMPUTATION Left 07/16/2014    Procedure: LEFT SECOND TOE AMPUTATION;  Surgeon: Gaile ORN Serene, MD;  Location: [facility] OR;  Service: Vascular;  Laterality: Left;  With Nerve block   ATRIAL FIBRILLATION ABLATION N/A 08/02/2021    Procedure: ATRIAL FIBRILLATION ABLATION;  Surgeon: Inocencio Soyla Lunger, MD;  Location: [facility];  Service: Cardiovascular;  Laterality: N/A;   AUGMENTATION MAMMAPLASTY       BELPHAROPTOSIS REPAIR        eyelid lift   BIOPSY   10/14/2018    Procedure: BIOPSY;  Surgeon: Wilhelmenia Aloha Raddle., MD;  Location: [facility] ENDOSCOPY;  Service: Gastroenterology;;   BIOPSY   08/04/2019    Procedure: BIOPSY;  Surgeon: Wilhelmenia Aloha Raddle., MD;  Location: [facility]  ENDOSCOPY;  Service: Gastroenterology;;   CARDIOVERSION N/A 09/19/2019    Procedure: CARDIOVERSION;  Surgeon: Lonni Slain, MD;  Location: [facility] ENDOSCOPY;  Service: Cardiovascular;  Laterality: N/A;   CARDIOVERSION N/A 11/05/2023    Procedure: CARDIOVERSION;  Surgeon: Mona Vinie BROCKS, MD;  Location: [facility];  Service: Cardiovascular;  Laterality: N/A;   COLONOSCOPY       COLONOSCOPY WITH PROPOFOL  Left 04/11/2023    Procedure: COLONOSCOPY WITH PROPOFOL ;  Surgeon: Legrand Victory LITTIE DOUGLAS, MD;  Location: [facility] ENDOSCOPY;  Service: Gastroenterology;  Laterality: Left;   COMBINED AUGMENTATION MAMMAPLASTY AND ABDOMINOPLASTY   2009    W/  BILATERAL  THIGH LIFT   CYSTO/  RIGHT URETERAL STENT PLACEMENT   12/30/2010   CYSTOSCOPY WITH RETROGRADE PYELOGRAM, URETEROSCOPY AND STENT PLACEMENT Right 10/07/2013    Procedure: CYSTOSCOPY WITH RETROGRADE PYELOGRAM, right URETEROSCOPY AND STENT PLACEMENT, stone extraction;  Surgeon: Oliva VEAR Oiler, MD;  Location: [facility];  Service: Urology;  Laterality: Right;   CYSTOSCOPY WITH STENT PLACEMENT Right 06/04/2013    Procedure: CYSTOSCOPY WITH STENT PLACEMENT;  Surgeon: Garnette Shack, MD;  Location: [facility];  Service: Urology;  Laterality: Right;   DILATATION & CURETTAGE/HYSTEROSCOPY WITH MYOSURE N/A 04/27/2015    Procedure: DILATATION & CURETTAGE/HYSTEROSCOPY WITH MYOSURE;  Surgeon: Curlee VEAR Guan, MD;  Location: [facility];  Service: Gynecology;  Laterality: N/A;   ENDARTERECTOMY FEMORAL Left 06/08/2014    Procedure: Left Leg Common Femoral and External Iliac  Endartarectomy with patch Angioplasty;  Surgeon: Krystal JULIANNA Doing, MD;  Location: [facility] OR;  Service: Vascular;  Laterality: Left;   ENTEROSCOPY N/A 04/09/2023    Procedure: ENTEROSCOPY;  Surgeon: Legrand Victory LITTIE DOUGLAS, MD;  Location: [facility] ENDOSCOPY;  Service: Gastroenterology;  Laterality: N/A;   ESOPHAGOGASTRODUODENOSCOPY N/A 10/14/2018    Procedure: ESOPHAGOGASTRODUODENOSCOPY (EGD);  Surgeon:  Wilhelmenia Aloha Raddle., MD;  Location: [facility] ENDOSCOPY;  Service: Gastroenterology;  Laterality: N/A;   ESOPHAGOGASTRODUODENOSCOPY (EGD) WITH PROPOFOL  N/A 11/06/2018    Procedure: ESOPHAGOGASTRODUODENOSCOPY (EGD) WITH PROPOFOL ;  Surgeon: Wilhelmenia Aloha Raddle., MD;  Location: [facility]OS[facility];  Service: Gastroenterology;  Laterality: N/A;  RFA   ESOPHAGOGASTRODUODENOSCOPY (EGD) WITH PROPOFOL  N/A 02/12/2019    Procedure: ESOPHAGOGASTRODUODENOSCOPY (EGD) WITH PROPOFOL ;  Surgeon: Wilhelmenia Aloha Raddle., MD;  Location: [facility]OS[facility];  Service: Gastroenterology;  Laterality: N/A;   ESOPHAGOGASTRODUODENOSCOPY (EGD) WITH PROPOFOL  N/A 08/04/2019    Procedure: ESOPHAGOGASTRODUODENOSCOPY (EGD) WITH PROPOFOL ;  Surgeon:  Mansouraty, Aloha Raddle., MD;  Location: [facility] ENDOSCOPY;  Service: Gastroenterology;  Laterality: N/A;  WITH RFA   EXTRACORPOREAL SHOCK WAVE LITHOTRIPSY Right 08-04-2013//   06-23-2013//   01-16-2011   EYE SURGERY Bilateral      cataract   FEMORAL-POPLITEAL BYPASS GRAFT Left 06/08/2014    Procedure: Left Leg Femoral -Popliteal Bypass Graft;  Surgeon: Krystal JULIANNA Doing, MD;  Location: [facility] OR;  Service: Vascular;  Laterality: Left;   FEMORAL-POPLITEAL BYPASS GRAFT Left 06/09/2014    Procedure: Left Femoral and Popliteal Exposure; Left Femoral to Anterior Tibial Bypass Graft using Propaten 6mm by 80cm Goretex Graft; Left Tibial Endarterectomy; Left Femoraland Popliteal Thrombectomy ;  Surgeon: Gaile LELON New, MD;  Location: [facility] OR;  Service: Vascular;  Laterality: Left;   FEMORAL-TIBIAL BYPASS GRAFT Right 03/09/2023    Procedure: RIGHT FEMORAL-POSTERIOR TIBIAL ARTERY BYPASS USING X 80CM PROPATEN GRAFT;  Surgeon: New Gaile LELON, MD;  Location: [facility];  Service: Vascular;  Laterality: Right;   GI RADIOFREQUENCY ABLATION N/A 11/06/2018    Procedure: GI RADIOFREQUENCY ABLATION;  Surgeon: Wilhelmenia Aloha Raddle., MD;  Location: [facility]OS[facility];  Service: Gastroenterology;  Laterality: N/A;   GI RADIOFREQUENCY  ABLATION N/A 02/12/2019    Procedure: GI RADIOFREQUENCY ABLATION;  Surgeon: Wilhelmenia Aloha Raddle., MD;  Location: [facility]OS[facility];  Service: Gastroenterology;  Laterality: N/A;   GI RADIOFREQUENCY ABLATION N/A 08/04/2019    Procedure: GI RADIOFREQUENCY ABLATION;  Surgeon: Wilhelmenia Aloha Raddle., MD;  Location: [facility] ENDOSCOPY;  Service: Gastroenterology;  Laterality: N/A;   GIVENS CAPSULE STUDY N/A 04/09/2023    Procedure: GIVENS CAPSULE STUDY;  Surgeon: Legrand Victory LITTIE DOUGLAS, MD;  Location: [facility] ENDOSCOPY;  Service: Gastroenterology;  Laterality: N/A;   HOLMIUM LASER APPLICATION Right 10/07/2013    Procedure: HOLMIUM LASER APPLICATION;  Surgeon: Oliva VEAR Oiler, MD;  Location: [facility];  Service: Urology;  Laterality: Right;   KIDNEY STONE SURGERY   07/2013    1-2 stones   LAPAROSCOPIC GASTRIC BANDING   05/29/2005   LITHOTRIPSY   2-3 times   LOWER EXTREMITY ANGIOGRAM N/A 06/04/2014    Procedure: LOWER EXTREMITY ANGIOGRAM;  Surgeon: Gaile LELON New, MD;  Location: [facility] CATH LAB;  Service: Cardiovascular;  Laterality: N/A;   ORIF FIFTH METACARPAL FX  RIGHT HAND   04/21/2002   PATCH ANGIOPLASTY Right 03/09/2023    Procedure: VEIN PATCH ANGIOPLASTY TO RIGHT FEMORAL ARTERY;  Surgeon: New Gaile LELON, MD;  Location: [facility] OR;  Service: Vascular;  Laterality: Right;   POLYPECTOMY       REVISION AND RE-SITING LAP-BAND PORT   04/08/2010    W/  UPPER EGD   RIGHT KNEE PATELLECTOMY W/ REPAIR OF EXTENSOR MECHANISM   04/14/2002   TEE WITHOUT CARDIOVERSION N/A 12/24/2019    Procedure: TRANSESOPHAGEAL ECHOCARDIOGRAM (TEE);  Surgeon: Shlomo Wilbert SAUNDERS, MD;  Location: [facility] ENDOSCOPY;  Service: Cardiovascular;  Laterality: N/A;   Toenail removed Left 05/21/2014    2nd toenail-  Dr. Christine          Allergies       Allergies  Allergen Reactions   Amlodipine  Swelling   Atorvastatin  Other (See Comments)      Muscle aches    Ampicillin  Nausea And Vomiting and Other (See Comments)   Codeine Nausea And  Vomiting      Ask patient   Lisinopril Cough          Penicillins Nausea And Vomiting      Did it involve swelling of the face/tongue/throat, SOB, or low  BP? No Did it involve sudden or severe rash/hives, skin peeling, or any reaction on the inside of your mouth or nose? No Did you need to seek medical attention at a hospital or doctor's office? No When did it last happen?      20+ years If all above answers are NO, may proceed with cephalosporin use.     Januvia  [Sitagliptin ] Nausea And Vomiting              Current Outpatient Medications  Medication Sig Dispense Refill   aspirin  EC 81 MG tablet Take 1 tablet (81 mg total) by mouth daily. Swallow whole. (Patient taking differently: Take 325 mg by mouth daily. Swallow whole.)       bisacodyl  (DULCOLAX) 5 MG EC tablet Take 5 mg by mouth daily as needed for moderate constipation. (Patient taking differently: Take 5 mg by mouth as needed for moderate constipation.)       Blood Glucose Monitoring Suppl (ONETOUCH VERIO IQ SYSTEM) w/Device KIT USE TO CHECK SUGAR DAILY 1 kit 0   Continuous Glucose Sensor (FREESTYLE LIBRE 3 PLUS SENSOR) MISC Change sensor every 15 days. 2 each 5   diltiazem  (CARDIZEM  CD) 120 MG 24 hr capsule Take 1 capsule (120 mg total) by mouth daily. 90 capsule 0   diltiazem  (CARDIZEM ) 30 MG tablet Take 30 mg by mouth every 4 (four) hours as needed.       Docusate Sodium  (COLACE PO) Take 1 tablet by mouth daily as needed. (Patient taking differently: Take 1 tablet by mouth as needed.)       dorzolamide -timolol  (COSOPT ) 22.3-6.8 MG/ML ophthalmic solution Place 1 drop into the right eye 2 (two) times daily.        ezetimibe  (ZETIA ) 10 MG tablet Take 1 tablet by mouth once daily 90 tablet 0   hydrALAZINE  (APRESOLINE ) 100 MG tablet Take 1 tablet (100 mg total) by mouth in the morning and at bedtime.       Insulin  Glargine (BASAGLAR  KWIKPEN) 100 UNIT/ML INJECT 50 UNITS UNDER THE SKIN DAILY 30 mL 5   insulin  lispro (HUMALOG   KWIKPEN) 100 UNIT/ML KwikPen Inject 14 Units into the skin with breakfast, with lunch, and with evening meal. DIAL  14 UNITS AND INJECT UNDER THE SKIN THREE TIMES A DAY. 45 mL 0   Insulin  Pen Needle (PEN NEEDLES) 32G X 4 MM MISC Inject 1 pen  as directed 4 (four) times daily. Use to inject levemir  or novolog  as directed. 400 each 3   latanoprost  (XALATAN ) 0.005 % ophthalmic solution Place 1 drop into both eyes at bedtime.        lubiprostone  (AMITIZA ) 24 MCG capsule Take 1 capsule (24 mcg total) by mouth 2 (two) times daily with a meal. 180 capsule 1   MAGNESIUM -OXIDE 400 (240 Mg) MG tablet Take 1 tablet by mouth once daily 90 tablet 0   nebivolol  (BYSTOLIC ) 10 MG tablet Take 1 tablet by mouth once daily 90 tablet 3   ONETOUCH VERIO test strip USE TO check blood glucose TWICE DAILY 200 strip 3   pantoprazole  (PROTONIX ) 40 MG tablet Take 1 tablet by mouth twice daily 180 tablet 0   rivaroxaban  (XARELTO ) 20 MG TABS tablet TAKE 1 TABLET BY MOUTH ONCE DAILY 90 tablet 0   rosuvastatin  (CRESTOR ) 5 MG tablet Take 1 tablet by mouth once daily 90 tablet 1   Semaglutide , 1 MG/DOSE, (OZEMPIC , 1 MG/DOSE,) 4 MG/3ML SOPN Inject 1 mg into the skin once a week. Via [employer] patient assistance  9 mL 1   SYNJARDY  5-500 MG TABS TAKE 1 TABLET BY MOUTH TWICE DAILY 180 tablet 1   torsemide  (DEMADEX ) 10 MG tablet Take 1 tablet (10 mg total) by mouth daily. 90 tablet 0      No current facility-administered medications for this visit.             Family History  Problem Relation Age of Onset   Kidney disease Mother     Hyperlipidemia Mother     Other Mother          AAA   and    Amputation   Diabetes Father     Heart disease Father     Deep vein thrombosis Father     Hyperlipidemia Father     Diabetes Sister     Heart disease Sister     Deep vein thrombosis Sister     Hyperlipidemia Sister     Diabetes Brother     Heart disease Brother     Cancer Brother     Hyperlipidemia Brother     Diabetes Brother      Diabetes Brother     Colon polyps Brother     Colon cancer Maternal Aunt     Arthritis Other     Cancer Other          colon   Hypertension Other     Stroke Other     Esophageal cancer Neg Hx     Stomach cancer Neg Hx     Inflammatory bowel disease Neg Hx     Liver disease Neg Hx     Pancreatic cancer Neg Hx     Crohn's disease Neg Hx     Rectal cancer Neg Hx            Social History         Socioeconomic History   Marital status: Single      Spouse name: Not on file   Number of children: Not on file   Years of education: Not on file   Highest education level: Not on file  Occupational History   Occupation: Cabin crew: [employer]  Tobacco Use   Smoking status: Former      Current packs/day: 0.00      Average packs/day: 1 pack/day for 28.0 years (28.0 ttl pk-yrs)      Types: Cigarettes      Start date: 09/05/1973      Quit date: 09/05/2001      Years since quitting: 22.2      Passive exposure: Never   Smokeless tobacco: Never  Vaping Use   Vaping status: Never Used  Substance and Sexual Activity   Alcohol use: No      Alcohol/week: 0.0 standard drinks of alcohol   Drug use: No   Sexual activity: Yes  Other Topics Concern   Not on file  Social History Narrative    Regular exercise- yes    Lives alone/2025    Social Drivers of Health        Financial Resource Strain: Low Risk  (10/15/2023)    Overall Financial Resource Strain (CARDIA)     Difficulty of Paying Living Expenses: Not hard at all  Food Insecurity: Low Risk  (10/23/2023)    Received from [facility]    Hunger Vital Sign     Within the past 12 months, you worried that your food would run out before you got money to  buy more: Never true     Within the past 12 months, the food you bought just didn't last and you didn't have money to get more. : Never true  Transportation Needs: No Transportation Needs (10/23/2023)    [facility] from [facility]    Transportation     In the past  12 months, has lack of reliable transportation kept you from medical appointments, meetings, work or from getting things needed for daily living? : No  Physical Activity: Sufficiently Active (10/15/2023)    Exercise Vital Sign     Days of Exercise per Week: 7 days     Minutes of Exercise per Session: 30 min  Stress: No Stress Concern Present (10/15/2023)    [employer] of Occupational Health - Occupational Stress Questionnaire     Feeling of Stress: Not at all  Social Connections: Moderately Integrated (10/15/2023)    Social Connection and Isolation Panel     Frequency of Communication with Friends and Family: More than three times a week     Frequency of Social Gatherings with Friends and Family: Twice a week     Attends Religious Services: More than 4 times per year     Active Member of [employer] or Organizations: Yes     Attends Banker Meetings: Never     Marital Status: Never married  Intimate Partner Violence: Patient Unable To Answer (10/15/2023)    Humiliation, Afraid, Rape, and Kick questionnaire     Fear of Current or Ex-Partner: Patient unable to answer     Emotionally Abused: Patient unable to answer     Physically Abused: Patient unable to answer     Sexually Abused: Patient unable to answer        REVIEW OF SYSTEMS:  [X]  denotes positive finding, [ ]  denotes negative finding Cardiac   Comments:  Chest pain or chest pressure:      Shortness of breath upon exertion:      Short of breath when lying flat:      Irregular heart rhythm:             Vascular      Pain in calf, thigh, or hip brought on by ambulation:      Pain in feet at night that wakes you up from your sleep:       Blood clot in your veins:      Leg swelling:              Pulmonary      Oxygen  at home:      Productive cough:       Wheezing:              Neurologic      Sudden weakness in arms or legs:       Sudden numbness in arms or legs:       Sudden onset of difficulty speaking or  slurred speech:      Temporary loss of vision in one eye:       Problems with dizziness:              Gastrointestinal      Blood in stool:       Vomited blood:              Genitourinary      Burning when urinating:       Blood in urine:             Psychiatric  Major depression:              Hematologic      Bleeding problems:      Problems with blood clotting too easily:             Skin      Rashes or ulcers:             Constitutional      Fever or chills:          PHYSICAL EXAMINATION:      Today's Vitals    11/12/23 1539  BP: (!) 174/65  Pulse: (!) 54  Temp: 97.9 F (36.6 C)  TempSrc: Temporal  SpO2: 98%  Weight: 173 lb 3.2 oz (78.6 kg)  Height: 5' 5 (1.651 m)  PainSc: 0-No pain    Body mass index is 28.82 kg/m.     General:  WDWN in NAD; vital signs documented above Gait: Not observed HENT: WNL, normocephalic Pulmonary: normal non-labored breathing , without wheezing Cardiac: regular HR, without carotid bruits Abdomen: soft, NT; aortic pulse is not palpable Skin: without rashes Vascular Exam/Pulses:   Right Left  Radial 2+ (normal) 2+ (normal)  Femoral 2+ (normal) 2+ (normal)  DP monophasic 1+ (weak) brisk doppler flow  PT Brisk monophasic Brisk monophasic    Extremities: without ischemic changes, without Gangrene , without cellulitis; without open wounds Musculoskeletal: no muscle wasting or atrophy       Neurologic: A&O X 3 Psychiatric:  The pt has Normal affect.     Non-Invasive Vascular Imaging:   ABI's/TBI's on 11/12/2023: Right:  [place]/0.36 - Great toe pressure: 68 Left:  0.86/0.37 - Great toe pressure: 69   Arterial duplex on 11/12/2023: Right Graft #1: Right CFA to PTA bypass with composite PTFE proximally and  GSV to PTA.  +------------------+--------+---------------+----------+--------+                   PSV cm/sStenosis       Waveform  Comments  +------------------+--------+---------------+----------+--------+   Inflow           182     30-49% stenosisbiphasic            +------------------+--------+---------------+----------+--------+  Prox Anastomosis  39                     monophasic          +------------------+--------+---------------+----------+--------+  Proximal Graft    31                     monophasic          +------------------+--------+---------------+----------+--------+  Mid Graft         26                     monophasic          +------------------+--------+---------------+----------+--------+  Distal Graft      26                                         +------------------+--------+---------------+----------+--------+  Distal Anastomosis539     >70% stenosis  monophasic          +------------------+--------+---------------+----------+--------+  Outflow          80                     monophasic          +------------------+--------+---------------+----------+--------+     +-----------+--------+-----+--------+----------+----------------+  LEFT      PSV cm/sRatioStenosisWaveform  Comments          +-----------+--------+-----+--------+----------+----------------+  ATA Distal 54                   monophasic                  +-----------+--------+-----+--------+----------+----------------+  PTA Distal 42                   monophasic                  +-----------+--------+-----+--------+----------+----------------+  PERO Distal                               appears occluded  +-----------+--------+-----+--------+----------+----------------+     Left Graft #1: Fem-pop  +--------------------+--------+--------+----------+--------------+                     PSV cm/sStenosisWaveform  Comments        +--------------------+--------+--------+----------+--------------+  Inflow             261             monophasic                +--------------------+--------+--------+----------+--------------+   Proximal Anastomosis58              monophasicPre occlusive   +--------------------+--------+--------+----------+--------------+  Proximal Graft              occluded                          +--------------------+--------+--------+----------+--------------+  Mid Graft                   occluded                          +--------------------+--------+--------+----------+--------------+  Distal Graft                                  Not visualized  +--------------------+--------+--------+----------+--------------+  Distal Anastomosis                            Not visualized  +--------------------+--------+--------+----------+--------------+  Outflow                                      Not visualized  +--------------------+--------+--------+----------+--------------+   Summary:  Right: Patent graft with distal anastomosis stenosis > 70%. In graft velocities < 40 cm/s.   Left: Graft appears occluded. Distal graft not visualized. Flow noted in the popliteal fossa which may be in the native popliteal artery.    Previous ABI's/TBI's on 05/14/2023: Right:  [place]/0.54 - Great toe pressure: 117 Left:  0.81/0.47 - Great toe pressure:  102   Previous arterial duplex on 05/14/2023: Right Graft #1: Fem-Distal Tibial  +------------------+--------+---------------+--------+---------------------                   PSV cm/sStenosis       WaveformComments              +------------------+--------+---------------+--------+---------------------  Inflow           266     50-74% stenosisbiphasicRight distal EIA      +------------------+--------+---------------+--------+---------------------  Prox Anastomosis  50                     biphasic                      +------------------+--------+---------------+--------+---------------------   Proximal Graft    48                     biphasic                       +------------------+--------+---------------+--------+---------------------   Mid Graft         56                     biphasic                      +------------------+--------+---------------+--------+---------------------   Distal Graft      58                     biphasic                      +------------------+--------+---------------+--------+---------------------   Distal Anastomosis227     50-70% stenosisbiphasicVessel diameter   reduction: 0.60 cm to 0.20 cm                   +------------------+--------+---------------+--------+---------------------  Outflow          268     50-74% stenosisbiphasic                      +------------------+--------+---------------+--------+---------------------     Summary:  Right: Patent femoral to distal tibial artery bypass graft with elevated  velocities at the external iliac artery inflow (50-74%), distal anastmosis  (50-74%) and outflow (50-74%).        ASSESSMENT/PLAN:: 68 y.o. female here for follow up for PAD with hx of  left EIA and CFA endarterectomy with Dacron patch angioplasty and left femoral to BK popliteal bypass with non reversed GSV on 06/08/2014 by Dr. Oris for non healing toe ulcer.   She was taken back to the operating room on 06/09/2014 for occluded bypass and underwent left femoral to BK popliteal artery bypass with PTFE, thrombectomy of left popliteal artery by Dr. Serene.   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 Serene.  On 03/09/2023, she underwent right iliofemoral endarterectomy with vein patch angioplasty, right femoral to PTA bypass with composite graft (6mm external ring PTFE proximally and saphenous vein to PTA by Dr. Serene for right great toe ulcer.      -pt with significantly elevated velocity at the distal anastomosis right leg bypass 539cm/s and decreased velocities throughout the graft.  Given the above, the bypass is threatened and recommend angiogram with RLE runoff  and possible intervention.  She has palpable femoral pulses bilaterally.  Will get this scheduled with Dr. Serene in the near future.  -the left leg bypass is chronically occluded.  -continue asa/Xarelto /statin-her Xarelto  will need to be held prior to procedure.   -discussed importance of increased walking daily       [name] Apt, [facility] [phone]   Clinic MD:   Serene

## 2023-12-26 ENCOUNTER — Other Ambulatory Visit: Payer: Self-pay | Admitting: Family Medicine

## 2023-12-26 ENCOUNTER — Encounter (HOSPITAL_COMMUNITY): Payer: Self-pay | Admitting: Surgery

## 2023-12-26 ENCOUNTER — Telehealth: Payer: Self-pay

## 2023-12-26 DIAGNOSIS — I10 Essential (primary) hypertension: Secondary | ICD-10-CM

## 2023-12-26 NOTE — Telephone Encounter (Unsigned)
 Copied from CRM [id number]. Topic: Medical Record Request - Other >> Dec 26, 2023 10:16 AM Suzen RAMAN wrote: Reason for CRM: Tyricka- Care Coordinator for [facility] called to inform office/provider that information is missing from patient assistance application. Missing information:benefit investigation was completed for patient and came back undisclosed. Caller would like to know if a benefit investigation can be completed by provider or submit a copy of prior authorization determination through the appeals process.  CB# [phone]

## 2023-12-26 NOTE — Telephone Encounter (Signed)
 Theresa Harrington and Theresa Harrington a call to follow up on pt's PAP Xarelto ,spoke with a representative  explain pt is missing Gender Imf,2. On Sect.#6 pt sign instead of printing her name 3. Need to submit a copy of her ins. Card ,they run her credit report they show the income is for 2 people not 1 and is over $53k(over the limit of 50k )and if pt has 2 incomes they would need to send the 1040 and the 4% of pharmacy out of pocket spend($2.120) and the Benefit Investigation is to see if the med is cover under her Ins. If it is not we need to submit that it is not cover under ins.

## 2023-12-29 ENCOUNTER — Other Ambulatory Visit: Payer: Self-pay | Admitting: Internal Medicine

## 2023-12-29 DIAGNOSIS — I10 Essential (primary) hypertension: Secondary | ICD-10-CM

## 2024-01-04 ENCOUNTER — Other Ambulatory Visit

## 2024-01-10 ENCOUNTER — Telehealth: Payer: Self-pay

## 2024-01-10 NOTE — Telephone Encounter (Signed)
 Patient called reporting right leg claudication that stops when patient stops walking. Pt reported no rest pain, no new ulcers or non-healing wounds.  Pt advised to walk as much as tolerable, continue her medications as prescribed.  Pt will follow up in October with next appt.  Pt knows to call back or go to the ED if she develops rest pain, new ulcers, cold or pale leg and/or foot.

## 2024-01-11 ENCOUNTER — Other Ambulatory Visit: Payer: Self-pay

## 2024-01-11 DIAGNOSIS — I739 Peripheral vascular disease, unspecified: Secondary | ICD-10-CM

## 2024-01-16 ENCOUNTER — Ambulatory Visit (HOSPITAL_COMMUNITY)
Admission: RE | Admit: 2024-01-16 | Discharge: 2024-01-16 | Disposition: A | Discharge status: Home / Self Care | Source: Ambulatory Visit | Attending: Internal Medicine | Admitting: Internal Medicine

## 2024-01-16 ENCOUNTER — Encounter (HOSPITAL_COMMUNITY): Payer: Self-pay | Admitting: Internal Medicine

## 2024-01-16 VITALS — BP 94/64 | HR 131 | Ht 65.0 in | Wt 175.0 lb

## 2024-01-16 DIAGNOSIS — D6869 Other thrombophilia: Secondary | ICD-10-CM

## 2024-01-16 DIAGNOSIS — I4819 Other persistent atrial fibrillation: Secondary | ICD-10-CM | POA: Diagnosis not present

## 2024-01-16 DIAGNOSIS — I4892 Unspecified atrial flutter: Secondary | ICD-10-CM | POA: Diagnosis not present

## 2024-01-16 NOTE — Patient Instructions (Addendum)
 Hold Ozempic  until after the procedure and may resume after  Hold SYNJARDY  starting 01/20/24  Hold morning diabetic meds the day of procedure resume after   Cardioversion scheduled for: 01/24/24 Thursday 9:00 am   - Arrive at the [employer] [facility] ([address]  and check in with ADMITTING at   - Do not eat or drink anything after midnight the night prior to your procedure.   - Take all your morning medication (except diabetic medications) with a sip of water  prior to arrival.  - Do NOT miss any doses of your blood thinner - if you should miss a dose or take a dose more than 4 hours late -- please notify our office immediately.  - You will not be able to drive home after your procedure. Please ensure you have a responsible adult to drive you home. You will need someone with you for 24 hours post procedure.     - Expect to be in the procedural area approximately 2 hours.   - If you feel as if you go back into normal rhythm prior to scheduled cardioversion, please notify our office immediately.   If your procedure is canceled in the cardioversion suite you will be charged a cancellation fee.    Hold below medications 7 days prior to scheduled procedure/anesthesia.  Restart medication on the normal dosing day after scheduled procedure/anesthesia   Semaglutide  (Ozempic ) (Wegovy )  T  Hold below medications 72 hours prior to scheduled procedure/anesthesia. Restart medication on the following day after scheduled procedure/anesthesia  Empagliflozin /metformin  (Synjardy /Synjardy  XR)   For those patients who have a scheduled procedure/anesthesia on the same day of the week as their dose, hold the medication on the day of surgery.  They can take their scheduled dose the week before.  **Patients on the above medications scheduled for elective procedures that have not held the medication for the appropriate amount of time are at risk of cancellation or  change in the anesthetic plan.

## 2024-01-16 NOTE — Progress Notes (Addendum)
 Primary Care Physician: Joshua Debby CROME, MD Referring Physician:Dr. Inocencio  Primary Cardiologist: Dr Jeffrie Ida Theresa Harrington is a 68 y.o. female with a h/o afib fem-pop bypass 06/08/14, DM, CAD, HTN, atrial fibrillation who presents for follow up in the [facility]. She had a DCCV in 2021 but had quick return of afib. Patient is s/p afib ablation with Dr Theresa on 08/02/21.   On follow up 10/23/23, patient is currently in atrial flutter with RVR. Currently taking diltiazem  120 mg daily. No missed doses of Xarelto  20 mg daily. She took Ozempic  on Sunday.   On follow up 11/15/23, patient is currently in NSR. S/p successful DCCV on 11/05/23. No missed doses of Xarelto . Patient is scheduled for an abdominal aortogram on 8/12.   On follow up 01/16/24, patient is currently in atrial flutter with RVR. She has been out of rhythm for the past 3 weeks. She took Ozempic  on Sunday. No missed doses of Xarelto .   Today, she denies symptoms of palpitations, chest pain, shortness of breath, orthopnea, PND, lower extremity edema, dizziness, presyncope, syncope, or neurologic sequela. The patient is tolerating medications without difficulties and is otherwise without complaint today.   Past Medical History:  Diagnosis Date   Allergy    Anxiety    Pt. denies   Arthritis    right index figer   Asymptomatic cholelithiasis    Atherosclerosis of aorta (HCC)    Atrial flutter, paroxysmal (HCC) 02/20/2023   Cataract    Clotting disorder (HCC)    Gallstones 06/2013   GERD (gastroesophageal reflux disease)    Glaucoma    A LITTLE BIT   History of kidney stones    lithotrispy   Hyperlipidemia    Hypertension    Hypothyroidism    no meds   Iron  deficiency anemia    PAD (peripheral artery disease) (HCC)    PAF (paroxysmal atrial fibrillation) (HCC)    Peripheral arterial occlusive disease (HCC)    lower extremities   Pneumonia    PONV (postoperative nausea and vomiting)    Right  ureteral stone    Type 2 diabetes mellitus (HCC)    Type   Vitamin B 12 deficiency    Wears glasses    Past Surgical History:  Procedure Laterality Date   ABDOMINAL AORTOGRAM N/A 12/25/2023   Procedure: ABDOMINAL AORTOGRAM;  Surgeon: Theresa Gaile ORN, MD;  [facility]: [facility];  Service: Cardiovascular;  Laterality: N/A;   ABDOMINAL AORTOGRAM W/LOWER EXTREMITY N/A 03/09/2020   Procedure: ABDOMINAL AORTOGRAM W/LOWER EXTREMITY;  Surgeon: Theresa Gaile ORN, MD;  [facility]: [facility];  Service: Cardiovascular;  Laterality: N/A;   ABDOMINAL AORTOGRAM W/LOWER EXTREMITY N/A 03/06/2023   Procedure: ABDOMINAL AORTOGRAM W/LOWER EXTREMITY;  Surgeon: Theresa Gaile ORN, MD;  Location: [facility];  Service: Cardiovascular;  Laterality: N/A;   AMPUTATION Left 07/16/2014   Procedure: LEFT SECOND TOE AMPUTATION;  Surgeon: Gaile Harrington Serene, MD;  Location: [facility] OR;  Service: Vascular;  Laterality: Left;  With Nerve block   ATRIAL FIBRILLATION ABLATION N/A 08/02/2021   Procedure: ATRIAL FIBRILLATION ABLATION;  Surgeon: Theresa Soyla Lunger, MD;  [facility];  Service: Cardiovascular;  Laterality: N/A;   AUGMENTATION MAMMAPLASTY     BELPHAROPTOSIS REPAIR     eyelid lift   BIOPSY  10/14/2018   Procedure: BIOPSY;  Surgeon: Wilhelmenia Aloha Raddle., MD;  Location: [facility] ENDOSCOPY;  Service: Gastroenterology;;   BIOPSY  08/04/2019   Procedure: BIOPSY;  Surgeon: Wilhelmenia Aloha Raddle., MD;  Location: [facility] ENDOSCOPY;  Service: Gastroenterology;;   CARDIOVERSION N/A 09/19/2019   Procedure: CARDIOVERSION;  Surgeon: Lonni Slain, MD;  Location: [facility] ENDOSCOPY;  Service: Cardiovascular;  Laterality: N/A;   CARDIOVERSION N/A 11/05/2023   Procedure: CARDIOVERSION;  Surgeon: Mona Vinie BROCKS, MD;  [facility]: [facility];  Service: Cardiovascular;  Laterality: N/A;   COLONOSCOPY     COLONOSCOPY WITH PROPOFOL  Left 04/11/2023   Procedure: COLONOSCOPY WITH PROPOFOL ;  Surgeon:  Legrand Victory LITTIE DOUGLAS, MD;  Location: [facility] ENDOSCOPY;  Service: Gastroenterology;  Laterality: Left;   COMBINED AUGMENTATION MAMMAPLASTY AND ABDOMINOPLASTY  2009   W/  BILATERAL  THIGH LIFT   CYSTO/  RIGHT URETERAL STENT PLACEMENT  12/30/2010   CYSTOSCOPY WITH RETROGRADE PYELOGRAM, URETEROSCOPY AND STENT PLACEMENT Right 10/07/2013   Procedure: CYSTOSCOPY WITH RETROGRADE PYELOGRAM, right URETEROSCOPY AND STENT PLACEMENT, stone extraction;  Surgeon: Oliva VEAR Oiler, MD;  Location: [facility];  Service: Urology;  Laterality: Right;   CYSTOSCOPY WITH STENT PLACEMENT Right 06/04/2013   Procedure: CYSTOSCOPY WITH STENT PLACEMENT;  Surgeon: Garnette Shack, MD;  Location: [facility] ORS;  Service: Urology;  Laterality: Right;   DILATATION & CURETTAGE/HYSTEROSCOPY WITH MYOSURE N/A 04/27/2015   Procedure: DILATATION & CURETTAGE/HYSTEROSCOPY WITH MYOSURE;  Surgeon: Curlee VEAR Guan, MD;  Location: [facility] ORS;  Service: Gynecology;  Laterality: N/A;   ENDARTERECTOMY FEMORAL Left 06/08/2014   Procedure: Left Leg Common Femoral and External Iliac  Endartarectomy with patch Angioplasty;  Surgeon: Krystal JULIANNA Doing, MD;  Location: [facility] OR;  Service: Vascular;  Laterality: Left;   ENTEROSCOPY N/A 04/09/2023   Procedure: ENTEROSCOPY;  Surgeon: Legrand Victory LITTIE DOUGLAS, MD;  Location: [facility] ENDOSCOPY;  Service: Gastroenterology;  Laterality: N/A;   ESOPHAGOGASTRODUODENOSCOPY N/A 10/14/2018   Procedure: ESOPHAGOGASTRODUODENOSCOPY (EGD);  Surgeon: Wilhelmenia Aloha Raddle., MD;  Location: [facility] ENDOSCOPY;  Service: Gastroenterology;  Laterality: N/A;   ESOPHAGOGASTRODUODENOSCOPY (EGD) WITH PROPOFOL  N/A 11/06/2018   Procedure: ESOPHAGOGASTRODUODENOSCOPY (EGD) WITH PROPOFOL ;  Surgeon: Wilhelmenia Aloha Raddle., MD;  Location: [facility] ENDOSCOPY;  Service: Gastroenterology;  Laterality: N/A;  RFA   ESOPHAGOGASTRODUODENOSCOPY (EGD) WITH PROPOFOL  N/A 02/12/2019   Procedure: ESOPHAGOGASTRODUODENOSCOPY (EGD) WITH PROPOFOL ;  Surgeon: Wilhelmenia Aloha Raddle., MD;  Location: [facility] ENDOSCOPY;  Service: Gastroenterology;  Laterality: N/A;   ESOPHAGOGASTRODUODENOSCOPY (EGD) WITH PROPOFOL  N/A 08/04/2019   Procedure: ESOPHAGOGASTRODUODENOSCOPY (EGD) WITH PROPOFOL ;  Surgeon: Wilhelmenia Aloha Raddle., MD;  Location: [facility] ENDOSCOPY;  Service: Gastroenterology;  Laterality: N/A;  WITH RFA   EXTRACORPOREAL SHOCK WAVE LITHOTRIPSY Right 08-04-2013//   06-23-2013//   01-16-2011   EYE SURGERY Bilateral    cataract   FEMORAL-POPLITEAL BYPASS GRAFT Left 06/08/2014   Procedure: Left Leg Femoral -Popliteal Bypass Graft;  Surgeon: Krystal JULIANNA Doing, MD;  Location: [facility] OR;  Service: Vascular;  Laterality: Left;   FEMORAL-POPLITEAL BYPASS GRAFT Left 06/09/2014   Procedure: Left Femoral and Popliteal Exposure; Left Femoral to Anterior Tibial Bypass Graft using Propaten 6mm by 80cm Goretex Graft; Left Tibial Endarterectomy; Left Femoraland Popliteal Thrombectomy ;  Surgeon: Gaile LELON New, MD;  Location: [facility] OR;  Service: Vascular;  Laterality: Left;   FEMORAL-TIBIAL BYPASS GRAFT Right 03/09/2023   Procedure: RIGHT FEMORAL-POSTERIOR TIBIAL ARTERY BYPASS USING X 80CM PROPATEN GRAFT;  Surgeon: New Gaile LELON, MD;  Location: [facility] OR;  Service: Vascular;  Laterality: Right;   GI RADIOFREQUENCY ABLATION N/A 11/06/2018   Procedure: GI RADIOFREQUENCY ABLATION;  Surgeon: Wilhelmenia Aloha Raddle., MD;  Location: [facility] ENDOSCOPY;  Service: Gastroenterology;  Laterality: N/A;   GI RADIOFREQUENCY ABLATION  N/A 02/12/2019   Procedure: GI RADIOFREQUENCY ABLATION;  Surgeon: Wilhelmenia Aloha Raddle., MD;  Location: [facility];  Service: Gastroenterology;  Laterality: N/A;   GI RADIOFREQUENCY ABLATION N/A 08/04/2019   Procedure: GI RADIOFREQUENCY ABLATION;  Surgeon: Wilhelmenia Aloha Raddle., MD;  Location: [facility] ENDOSCOPY;  Service: Gastroenterology;  Laterality: N/A;   GIVENS CAPSULE STUDY N/A 04/09/2023   Procedure: GIVENS CAPSULE STUDY;  Surgeon: Legrand Victory LITTIE DOUGLAS, MD;  Location: [facility] ENDOSCOPY;   Service: Gastroenterology;  Laterality: N/A;   HOLMIUM LASER APPLICATION Right 10/07/2013   Procedure: HOLMIUM LASER APPLICATION;  Surgeon: Oliva VEAR Oiler, MD;  Location: [facility];  Service: Urology;  Laterality: Right;   KIDNEY STONE SURGERY  07/2013   1-2 stones   LAPAROSCOPIC GASTRIC BANDING  05/29/2005   LITHOTRIPSY  2-3 times   LOWER EXTREMITY ANGIOGRAM N/A 06/04/2014   Procedure: LOWER EXTREMITY ANGIOGRAM;  Surgeon: Gaile LELON New, MD;  Location: [facility] CATH LAB;  Service: Cardiovascular;  Laterality: N/A;   LOWER EXTREMITY ANGIOGRAPHY N/A 12/25/2023   Procedure: Lower Extremity Angiography;  Surgeon: New Gaile LELON, MD;  Location: [facility];  Service: Cardiovascular;  Laterality: N/A;   ORIF FIFTH METACARPAL FX  RIGHT HAND  04/21/2002   PATCH ANGIOPLASTY Right 03/09/2023   Procedure: VEIN PATCH ANGIOPLASTY TO RIGHT FEMORAL ARTERY;  Surgeon: New Gaile LELON, MD;  Location: [facility] OR;  Service: Vascular;  Laterality: Right;   POLYPECTOMY     REVISION AND RE-SITING LAP-BAND PORT  04/08/2010   W/  UPPER EGD   RIGHT KNEE PATELLECTOMY W/ REPAIR OF EXTENSOR MECHANISM  04/14/2002   TEE WITHOUT CARDIOVERSION N/A 12/24/2019   Procedure: TRANSESOPHAGEAL ECHOCARDIOGRAM (TEE);  Surgeon: Shlomo Wilbert SAUNDERS, MD;  Location: [facility] ENDOSCOPY;  Service: Cardiovascular;  Laterality: N/A;   Toenail removed Left 05/21/2014   2nd toenail-  Dr. Christine    Current Outpatient Medications  Medication Sig Dispense Refill   aspirin  EC 325 MG tablet Take 325 mg by mouth daily.     Blood Glucose Monitoring Suppl (ONETOUCH VERIO IQ SYSTEM) w/Device KIT USE TO CHECK SUGAR DAILY 1 kit 0   Continuous Glucose Receiver (FREESTYLE LIBRE 3 READER) DEVI Use to check blood glucose levels with Freestyle Libre 3 Plus sensors. 1 each 0   Continuous Glucose Sensor (FREESTYLE LIBRE 3 PLUS SENSOR) MISC Change sensor every 15 days. 2 each 5   diltiazem  (CARDIZEM  CD) 120 MG 24 hr capsule Take 1 capsule (120 mg  total) by mouth daily. 90 capsule 0   diltiazem  (CARDIZEM ) 30 MG tablet Take 30 mg by mouth every 4 (four) hours as needed (a-fib).     dorzolamide -timolol  (COSOPT ) 22.3-6.8 MG/ML ophthalmic solution Place 1 drop into the right eye 2 (two) times daily.      ezetimibe  (ZETIA ) 10 MG tablet Take 1 tablet by mouth once daily 90 tablet 0   glucose blood (ONETOUCH VERIO) test strip Use to check blood sugar 3x per day 300 strip 3   hydrALAZINE  (APRESOLINE ) 100 MG tablet Take 1 tablet (100 mg total) by mouth in the morning and at bedtime.     Insulin  Glargine (BASAGLAR  KWIKPEN) 100 UNIT/ML INJECT 50 UNITS UNDER THE SKIN DAILY 30 mL 5   insulin  lispro (HUMALOG  KWIKPEN) 100 UNIT/ML KwikPen Inject 14 Units into the skin with breakfast, with lunch, and with evening meal. DIAL  14 UNITS AND INJECT UNDER THE SKIN THREE TIMES A DAY. 45 mL 0   Insulin  Pen Needle (PEN NEEDLES) 32G X 4 MM MISC  Inject 1 pen  as directed 4 (four) times daily. Use to inject levemir  or novolog  as directed. 400 each 3   latanoprost  (XALATAN ) 0.005 % ophthalmic solution Place 1 drop into both eyes at bedtime.      losartan  (COZAAR ) 100 MG tablet Take 1 tablet by mouth once daily 90 tablet 0   MAGNESIUM -OXIDE 400 (240 Mg) MG tablet Take 1 tablet by mouth once daily 90 tablet 0   nebivolol  (BYSTOLIC ) 10 MG tablet Take 1 tablet by mouth once daily 90 tablet 3   pantoprazole  (PROTONIX ) 40 MG tablet Take 1 tablet by mouth twice daily 180 tablet 0   rivaroxaban  (XARELTO ) 20 MG TABS tablet TAKE 1 TABLET BY MOUTH ONCE DAILY 90 tablet 0   rosuvastatin  (CRESTOR ) 5 MG tablet Take 1 tablet by mouth once daily 90 tablet 3   Semaglutide , 1 MG/DOSE, (OZEMPIC , 1 MG/DOSE,) 4 MG/3ML SOPN Inject 1 mg into the skin once a week. Via [employer] patient assistance 9 mL 1   SYNJARDY  5-500 MG TABS TAKE 1 TABLET BY MOUTH TWICE DAILY 180 tablet 1   torsemide  (DEMADEX ) 20 MG tablet Take 1 tablet (20 mg total) by mouth daily. 90 tablet 0   No current  facility-administered medications for this encounter.    Allergies  Allergen Reactions   Amlodipine  Swelling   Atorvastatin  Other (See Comments)    Muscle aches    Ampicillin  Nausea And Vomiting and Other (See Comments)   Codeine Nausea And Vomiting    Ask patient   Lisinopril Cough        Penicillins Nausea And Vomiting    Did it involve swelling of the face/tongue/throat, SOB, or low BP? No Did it involve sudden or severe rash/hives, skin peeling, or any reaction on the inside of your mouth or nose? No Did you need to seek medical attention at a hospital or doctor's office? No When did it last happen?      20+ years If all above answers are NO, may proceed with cephalosporin use.    Januvia  [Sitagliptin ] Nausea And Vomiting   ROS- All systems are reviewed and negative except as per the HPI above  Physical Exam: Vitals:   01/16/24 1459  BP: 94/64  Pulse: (!) 131  Weight: 79.4 kg  Height: 5' 5 (1.651 m)     Wt Readings from Last 3 Encounters:  01/16/24 79.4 kg  12/25/23 78 kg  12/13/23 77.4 kg    Labs: Lab Results  Component Value Date   NA 143 12/25/2023   K 3.4 (L) 12/25/2023   CL 105 12/25/2023   CO2 24 10/12/2023   GLUCOSE 127 (H) 12/25/2023   BUN 11 12/25/2023   CREATININE 0.70 12/25/2023   CALCIUM  9.5 10/12/2023   MG 1.6 (L) 04/11/2023   Lab Results  Component Value Date   INR 1.0 03/09/2023   Lab Results  Component Value Date   CHOL 118 10/08/2023   HDL 46.60 10/08/2023   LDLCALC 38 10/08/2023   TRIG 164.0 (H) 10/08/2023   GEN- The patient is well appearing, alert and oriented x 3 today.   Neck - no JVD or carotid bruit noted Lungs- Clear to ausculation bilaterally, normal work of breathing Heart- Tachycardic regular rate (atrial flutter), no murmurs, rubs or gallops, PMI not laterally displaced Extremities- no clubbing, cyanosis, or edema Skin - no rash or ecchymosis noted   EKG today demonstrates Vent. rate 131 BPM PR interval *  ms QRS duration 86 ms QT/QTcB 352/519  ms P-R-T axes * 38 74 Atrial flutter with 2:1 A-V conduction Cannot rule out Anterior infarct (cited on or before 15-Nov-2023) Abnormal ECG When compared with ECG of 15-Nov-2023 10:39, Atrial flutter has replaced Sinus rhythm   Echo 09/26/23:  1. Left ventricular ejection fraction, by estimation, is 60 to 65%. The  left ventricle has normal function. Left ventricular endocardial border  not optimally defined to evaluate regional wall motion. Left ventricular  diastolic parameters are  indeterminate.   2. Right ventricular systolic function is normal. The right ventricular  size is mildly enlarged.   3. Left atrial size was severely dilated.   4. 2D Planimetry of 2.34 cm2. Continuity equation MVA 1.32 cm . The  mitral valve is degenerative. No evidence of mitral valve regurgitation.  Mild to moderate mitral stenosis. The mean mitral valve gradient is 5.0  mmHg with average heart rate of 61 bpm.   Severe mitral annular calcification.   5. The aortic valve is tricuspid. There is mild calcification of the  aortic valve. Aortic valve regurgitation is not visualized. Aortic valve  sclerosis is present, with no evidence of aortic valve stenosis.   6. Dilated pulmonary artery.   7. The inferior vena cava is normal in size with greater than 50%  respiratory variability, suggesting right atrial pressure of 3 mmHg.    CHA2DS2-VASc Score = 4  The patient's score is based upon: CHF History: 0 HTN History: 1 Diabetes History: 0 Stroke History: 0 Vascular Disease History: 1 Age Score: 1 Gender Score: 1       ASSESSMENT AND PLAN: 1. Persistent Atrial Fibrillation/atrial flutter The patient's CHA2DS2-VASc score is 4, indicating a 4.8% annual risk of stroke.   S/p Afib ablation 08/02/21 by Dr. Inocencio. S/p successful DCCV on 11/05/23.  Patient is currently in atrial flutter with RVR. Patient has a Qtc in sinus rhythm that appears prohibitive of  Tikosyn and Multaq; perhaps amiodarone  could be considered as a bridge to ablation if EP agrees. Given now ERAF a few months apart, will likely have patient follow up with EP to discuss repeat ablation. For now, we discussed cardioversion as a procedure to try to restore normal rhythm. We discussed the procedure cardioversion to try to convert to NSR. We discussed the risks vs benefits of this procedure and how ultimately we cannot predict whether a patient will have early return of arrhythmia post procedure. After discussion, the patient wishes to proceed with cardioversion. Labs drawn today. Patient advised to hold Ozempic . Will need to hold Synjardy  3 days prior to cardioversion.   Informed Consent   Shared Decision Making/Informed Consent The risks (stroke, cardiac arrhythmias rarely resulting in the need for a temporary or permanent pacemaker, skin irritation or burns and complications associated with conscious sedation including aspiration, arrhythmia, respiratory failure and death), benefits (restoration of normal sinus rhythm) and alternatives of a direct current cardioversion were explained in detail to Ms. Theresa Harrington and she agrees to proceed.      2. Secondary Hypercoagulable State (ICD10:  D68.69) The patient is at significant risk for stroke/thromboembolism based upon her CHA2DS2-VASc Score of 4.  Continue Rivaroxaban  (Xarelto ).  No missed doses.   3. HTN Hypotensive today. I cannot increase rate control.   4. CAD CAC score on CT 704 On statin No chest pain.   5. PAD S/p fem pop bypass 2016 S/p angiogram 12/25/23 no intervention continue medication mgmt Followed by [facility]  6. VHD Mild-moderate MS Followed by Dr Jeffrie    Follow  up after DCCV with Dr. Inocencio.    Dorn Heinrich, PA-C [facility] [address] [place], [place] [address] [phone]

## 2024-01-17 ENCOUNTER — Other Ambulatory Visit (HOSPITAL_COMMUNITY): Payer: Self-pay | Admitting: *Deleted

## 2024-01-17 ENCOUNTER — Ambulatory Visit (HOSPITAL_COMMUNITY): Payer: Self-pay | Admitting: Internal Medicine

## 2024-01-17 ENCOUNTER — Encounter (HOSPITAL_COMMUNITY): Payer: Self-pay | Admitting: *Deleted

## 2024-01-17 LAB — CBC
Hematocrit: 40.5 % (ref 34.0–46.6)
Hemoglobin: 12.1 g/dL (ref 11.1–15.9)
MCH: 25.9 pg — ABNORMAL LOW (ref 26.6–33.0)
MCHC: 29.9 g/dL — ABNORMAL LOW (ref 31.5–35.7)
MCV: 87 fL (ref 79–97)
Platelets: 272 x10E3/uL (ref 150–450)
RBC: 4.68 x10E6/uL (ref 3.77–5.28)
RDW: 19.4 % — ABNORMAL HIGH (ref 11.7–15.4)
WBC: 8.8 x10E3/uL (ref 3.4–10.8)

## 2024-01-17 LAB — BASIC METABOLIC PANEL WITH GFR
BUN/Creatinine Ratio: 16 (ref 12–28)
BUN: 17 mg/dL (ref 8–27)
CO2: 25 mmol/L (ref 20–29)
Calcium: 9.4 mg/dL (ref 8.7–10.3)
Chloride: 100 mmol/L (ref 96–106)
Creatinine, Ser: 1.09 mg/dL — ABNORMAL HIGH (ref 0.57–1.00)
Glucose: 206 mg/dL — ABNORMAL HIGH (ref 70–99)
Potassium: 3.4 mmol/L — ABNORMAL LOW (ref 3.5–5.2)
Sodium: 143 mmol/L (ref 134–144)
eGFR: 56 mL/min/1.73 — ABNORMAL LOW (ref >59)

## 2024-01-17 MED ORDER — AMIODARONE HCL 200 MG PO TABS
ORAL_TABLET | ORAL | 2 refills | Status: DC
Start: 2024-01-17 — End: 2024-02-11

## 2024-01-17 MED ORDER — POTASSIUM CHLORIDE CRYS ER 20 MEQ PO TBCR: 20.0000 meq | EXTENDED_RELEASE_TABLET | Freq: Every day | ORAL | 0 refills | Status: DC

## 2024-01-23 NOTE — Progress Notes (Signed)
 Spoke to patient and instructed them to come at 0945  and to be NPO after 0000.     Confirmed that patient will have a ride home and someone to stay with them for 24 hours after the procedure.   Medications reviewed.  Confirmed blood thinner.  Confirmed no breaks in taking blood thinner for 3+ weeks prior to procedure. Confirmed patient stopped all GLP-1s and GLP-2s for at least one week before procedure.

## 2024-01-24 ENCOUNTER — Ambulatory Visit (HOSPITAL_BASED_OUTPATIENT_CLINIC_OR_DEPARTMENT_OTHER): Admitting: Anesthesiology

## 2024-01-24 ENCOUNTER — Encounter (HOSPITAL_COMMUNITY)
Admission: RE | Disposition: A | Discharge status: Home / Self Care | Payer: Self-pay | Source: Home / Self Care | Attending: Cardiology

## 2024-01-24 ENCOUNTER — Ambulatory Visit (HOSPITAL_COMMUNITY): Admitting: Anesthesiology

## 2024-01-24 ENCOUNTER — Other Ambulatory Visit: Payer: Self-pay

## 2024-01-24 ENCOUNTER — Ambulatory Visit (HOSPITAL_COMMUNITY)
Admission: RE | Admit: 2024-01-24 | Discharge: 2024-01-24 | Disposition: A | Discharge status: Home / Self Care | Attending: Cardiology | Admitting: Cardiology

## 2024-01-24 ENCOUNTER — Encounter (HOSPITAL_COMMUNITY): Payer: Self-pay | Admitting: Cardiology

## 2024-01-24 DIAGNOSIS — Z7985 Long-term (current) use of injectable non-insulin antidiabetic drugs: Secondary | ICD-10-CM | POA: Insufficient documentation

## 2024-01-24 DIAGNOSIS — I4819 Other persistent atrial fibrillation: Secondary | ICD-10-CM

## 2024-01-24 DIAGNOSIS — E119 Type 2 diabetes mellitus without complications: Secondary | ICD-10-CM | POA: Diagnosis not present

## 2024-01-24 DIAGNOSIS — E1151 Type 2 diabetes mellitus with diabetic peripheral angiopathy without gangrene: Secondary | ICD-10-CM | POA: Diagnosis not present

## 2024-01-24 DIAGNOSIS — Z79899 Other long term (current) drug therapy: Secondary | ICD-10-CM | POA: Insufficient documentation

## 2024-01-24 DIAGNOSIS — Z87891 Personal history of nicotine dependence: Secondary | ICD-10-CM | POA: Diagnosis not present

## 2024-01-24 DIAGNOSIS — D6869 Other thrombophilia: Secondary | ICD-10-CM | POA: Insufficient documentation

## 2024-01-24 DIAGNOSIS — Z7901 Long term (current) use of anticoagulants: Secondary | ICD-10-CM | POA: Diagnosis not present

## 2024-01-24 DIAGNOSIS — I10 Essential (primary) hypertension: Secondary | ICD-10-CM | POA: Diagnosis not present

## 2024-01-24 DIAGNOSIS — I4892 Unspecified atrial flutter: Secondary | ICD-10-CM | POA: Insufficient documentation

## 2024-01-24 DIAGNOSIS — Z95828 Presence of other vascular implants and grafts: Secondary | ICD-10-CM | POA: Diagnosis not present

## 2024-01-24 DIAGNOSIS — I2510 Atherosclerotic heart disease of native coronary artery without angina pectoris: Secondary | ICD-10-CM | POA: Insufficient documentation

## 2024-01-24 HISTORY — PX: CARDIOVERSION: EP1203

## 2024-01-24 LAB — POCT I-STAT, CHEM 8
BUN: 12 mg/dL (ref 8–23)
Calcium, Ion: 1.19 mmol/L (ref 1.15–1.40)
Chloride: 103 mmol/L (ref 98–111)
Creatinine, Ser: 0.8 mg/dL (ref 0.44–1.00)
Glucose, Bld: 165 mg/dL — ABNORMAL HIGH (ref 70–99)
HCT: 40 % (ref 36.0–46.0)
Hemoglobin: 13.6 g/dL (ref 12.0–15.0)
Potassium: 3.2 mmol/L — ABNORMAL LOW (ref 3.5–5.1)
Sodium: 142 mmol/L (ref 135–145)
TCO2: 24 mmol/L (ref 22–32)

## 2024-01-24 SURGERY — CARDIOVERSION (CATH LAB)
Anesthesia: General

## 2024-01-24 MED ORDER — PROPOFOL 10 MG/ML IV BOLUS
INTRAVENOUS | Status: DC | PRN
  Administered 2024-01-24: 60 mg via INTRAVENOUS

## 2024-01-24 MED ORDER — LIDOCAINE 2% (20 MG/ML) 5 ML SYRINGE
INTRAMUSCULAR | Status: DC | PRN
  Administered 2024-01-24: 60 mg via INTRAVENOUS

## 2024-01-24 MED ORDER — SODIUM CHLORIDE 0.9 % IV SOLN: INTRAVENOUS | Status: DC

## 2024-01-24 SURGICAL SUPPLY — 1 items: PAD DEFIB RADIO PHYSIO CONN (PAD) ×2 IMPLANT

## 2024-01-24 NOTE — Transfer of Care (Signed)
 Immediate Anesthesia Transfer of Care Note  Patient: Theresa Harrington  Procedure(s) Performed: CARDIOVERSION  Patient Location: PACU  Anesthesia Type:General  Level of Consciousness: drowsy, patient cooperative, and responds to stimulation  Airway & Oxygen  Therapy: Patient Spontanous Breathing and Patient connected to nasal cannula oxygen   Post-op Assessment: Report given to RN and Post -op Vital signs reviewed and stable  Post vital signs: Reviewed and stable  Last Vitals:  Vitals Value Taken Time  BP 107/59 01/24/24 1111  Temp 97.9 01/24/24 1111  Pulse 54 01/24/24 1111  Resp 14 01/24/24 1111  SpO2 93 01/24/24 1111    Last Pain:  Vitals:   01/24/24 1037  TempSrc:   PainSc: 0-No pain         Complications: No notable events documented.

## 2024-01-24 NOTE — Interval H&P Note (Signed)
 History and Physical Interval Note:  01/24/2024 10:45 AM  Theresa Harrington  has presented today for surgery, with the diagnosis of afib.  The various methods of treatment have been discussed with the patient and family. After consideration of risks, benefits and other options for treatment, the patient has consented to  Procedure(s): CARDIOVERSION (N/A) as a surgical intervention.  The patient's history has been reviewed, patient examined, no change in status, stable for surgery.  I have reviewed the patient's chart and labs.  Questions were answered to the patient's satisfaction.     Theresa Harrington

## 2024-01-24 NOTE — Anesthesia Preprocedure Evaluation (Signed)
 Anesthesia Evaluation  Patient identified by MRN, date of birth, ID band Patient awake    History of Anesthesia Complications (+) PONV and history of anesthetic complications  Airway Mallampati: II  TM Distance: >3 FB Neck ROM: Full    Dental no notable dental hx.    Pulmonary former smoker   Pulmonary exam normal        Cardiovascular hypertension, + Peripheral Vascular Disease   Rhythm:Irregular Rate:Normal     Neuro/Psych    GI/Hepatic   Endo/Other  diabetes    Renal/GU      Musculoskeletal   Abdominal Normal abdominal exam  (+)   Peds  Hematology   Anesthesia Other Findings   Reproductive/Obstetrics                              Anesthesia Physical Anesthesia Plan  ASA: 3  Anesthesia Plan: General   Post-op Pain Management:    Induction: Intravenous  PONV Risk Score and Plan: 4 or greater and Treatment may vary due to age or medical condition  Airway Management Planned: Mask  Additional Equipment: None  Intra-op Plan:   Post-operative Plan:   Informed Consent: I have reviewed the patients History and Physical, chart, labs and discussed the procedure including the risks, benefits and alternatives for the proposed anesthesia with the patient or authorized representative who has indicated his/her understanding and acceptance.     Dental advisory given  Plan Discussed with: CRNA  Anesthesia Plan Comments:         Anesthesia Quick Evaluation

## 2024-01-24 NOTE — Procedures (Signed)
 Electrical Cardioversion Procedure Note Theresa Harrington [id number] 07/14/1955  Procedure: Electrical Cardioversion Indications:  Atrial Fibrillation  Procedure Details Consent: Risks of procedure as well as the alternatives and risks of each were explained to the (patient/caregiver).  Consent for procedure obtained. Time Out: Verified patient identification, verified procedure, site/side was marked, verified correct patient position, special equipment/implants available, medications/allergies/relevent history reviewed, required imaging and test results available.  Performed  Patient placed on cardiac monitor, pulse oximetry, supplemental oxygen  as necessary.  Sedation given: Pt sedated by anesthesia with lidocaine  60 mg and diprovan 60 mg IV.  Pacer pads placed anterior and posterior chest.  Cardioverted 1 time(s).  Cardioverted at 300J.  Evaluation Findings: Post procedure EKG shows: NSR Complications: None Patient did tolerate procedure well.   Theresa Harrington 01/24/2024, 10:44 AM

## 2024-01-24 NOTE — H&P (Signed)
 ATRIAL FIB OFFICE VISIT 01/16/2024 Atrial Fib Clinic at [facility] of [facility] [place]   Theresa Fairy PARAS, PA-C Cardiology Persistent atrial fibrillation [facility]) +2 more Dx Referred by Joshua Debby CROME, MD Reason for Visit   Additional Documentation  Vitals: BP 94/64   Pulse 131 Important    Ht 5' 5 (1.651 m)   Wt 79.4 kg   LMP  (LMP Unknown)   BMI 29.12 kg/m   BSA 1.91 m  Flowsheets: NEWS,   MEWS Score,   Vital Signs,   Anthropometrics,   Data,   Method of Visit  Encounter Info: Billing Info,   History,   Allergies,   Detailed Report   All Notes   Progress Notes by Theresa Fairy PARAS, PA-C at 01/16/2024 3:00 PM  Author: Terra Fairy PARAS, PA-C Author Type: Physician Assistant Certified Filed: 01/17/2024  1:40 PM  Note Status: Addendum Cosign: Cosign Not Required Date of Service: 01/16/2024  3:00 PM  Editor: Theresa Harrington (Physician Assistant Certified)      Prior Versions: 1. Suarez, Joseph J, PA-C (Physician Assistant Certified) at 01/16/2024  3:42 PM - Addendum   2. Suarez, Joseph J, PA-C (Physician Assistant Certified) at 01/16/2024  3:41 PM - Signed  Expand All Collapse All    Primary Care Physician: Joshua Debby CROME, MD Referring Physician:Dr. Inocencio  Primary Cardiologist: Dr Jeffrie [name] is a 68 y.o. female with a h/o afib fem-pop bypass 06/08/14, DM, CAD, HTN, atrial fibrillation who presents for follow up in the [facility]. She had a DCCV in 2021 but had quick return of afib. Patient is s/p afib ablation with Dr Inocencio on 08/02/21.    On follow up 10/23/23, patient is currently in atrial flutter with RVR. Currently taking diltiazem  120 mg daily. No missed doses of Xarelto  20 mg daily. She took Ozempic  on Sunday.    On follow up 11/15/23, patient is currently in NSR. S/p successful DCCV on 11/05/23. No missed doses of Xarelto . Patient is scheduled for an abdominal aortogram on 8/12.    On follow up  01/16/24, patient is currently in atrial flutter with RVR. She has been out of rhythm for the past 3 weeks. She took Ozempic  on Sunday. No missed doses of Xarelto .    Today, she denies symptoms of palpitations, chest pain, shortness of breath, orthopnea, PND, lower extremity edema, dizziness, presyncope, syncope, or neurologic sequela. The patient is tolerating medications without difficulties and is otherwise without complaint today.        Past Medical History:  Diagnosis Date   Allergy     Anxiety      Pt. denies   Arthritis      right index figer   Asymptomatic cholelithiasis     Atherosclerosis of aorta (HCC)     Atrial flutter, paroxysmal (HCC) 02/20/2023   Cataract     Clotting disorder (HCC)     Gallstones 06/2013   GERD (gastroesophageal reflux disease)     Glaucoma      A LITTLE BIT   History of kidney stones      lithotrispy   Hyperlipidemia     Hypertension     Hypothyroidism      no meds   Iron  deficiency anemia     PAD (peripheral artery disease) (HCC)     PAF (paroxysmal atrial fibrillation) (HCC)     Peripheral arterial occlusive disease (HCC)  lower extremities   Pneumonia     PONV (postoperative nausea and vomiting)     Right ureteral stone     Type 2 diabetes mellitus (HCC)      Type   Vitamin B 12 deficiency     Wears glasses               Past Surgical History:  Procedure Laterality Date   ABDOMINAL AORTOGRAM N/A 12/25/2023    Procedure: ABDOMINAL AORTOGRAM;  Surgeon: Serene Gaile ORN, MD;  Location: [facility];  Service: Cardiovascular;  Laterality: N/A;   ABDOMINAL AORTOGRAM W/LOWER EXTREMITY N/A 03/09/2020    Procedure: ABDOMINAL AORTOGRAM W/LOWER EXTREMITY;  Surgeon: Serene Gaile ORN, MD;  Location: [facility];  Service: Cardiovascular;  Laterality: N/A;   ABDOMINAL AORTOGRAM W/LOWER EXTREMITY N/A 03/06/2023    Procedure: ABDOMINAL AORTOGRAM W/LOWER EXTREMITY;  Surgeon: Serene Gaile ORN, MD;  Location: [facility];   Service: Cardiovascular;  Laterality: N/A;   AMPUTATION Left 07/16/2014    Procedure: LEFT SECOND TOE AMPUTATION;  Surgeon: Gaile ORN Serene, MD;  Location: [facility] OR;  Service: Vascular;  Laterality: Left;  With Nerve block   ATRIAL FIBRILLATION ABLATION N/A 08/02/2021    Procedure: ATRIAL FIBRILLATION ABLATION;  Surgeon: Inocencio Soyla Lunger, MD;  [facility]: [facility];  Service: Cardiovascular;  Laterality: N/A;   AUGMENTATION MAMMAPLASTY       BELPHAROPTOSIS REPAIR        eyelid lift   BIOPSY   10/14/2018    Procedure: BIOPSY;  Surgeon: Wilhelmenia Aloha Raddle., MD;  Location: [facility] ENDOSCOPY;  Service: Gastroenterology;;   BIOPSY   08/04/2019    Procedure: BIOPSY;  Surgeon: Wilhelmenia Aloha Raddle., MD;  Location: [facility] ENDOSCOPY;  Service: Gastroenterology;;   CARDIOVERSION N/A 09/19/2019    Procedure: CARDIOVERSION;  Surgeon: Lonni Slain, MD;  Location: [facility] ENDOSCOPY;  Service: Cardiovascular;  Laterality: N/A;   CARDIOVERSION N/A 11/05/2023    Procedure: CARDIOVERSION;  Surgeon: Mona Vinie BROCKS, MD;  [facility]: [facility];  Service: Cardiovascular;  Laterality: N/A;   COLONOSCOPY       COLONOSCOPY WITH PROPOFOL  Left 04/11/2023    Procedure: COLONOSCOPY WITH PROPOFOL ;  Surgeon: Legrand Victory LITTIE DOUGLAS, MD;  Location: [facility] ENDOSCOPY;  Service: Gastroenterology;  Laterality: Left;   COMBINED AUGMENTATION MAMMAPLASTY AND ABDOMINOPLASTY   2009    W/  BILATERAL  THIGH LIFT   CYSTO/  RIGHT URETERAL STENT PLACEMENT   12/30/2010   CYSTOSCOPY WITH RETROGRADE PYELOGRAM, URETEROSCOPY AND STENT PLACEMENT Right 10/07/2013    Procedure: CYSTOSCOPY WITH RETROGRADE PYELOGRAM, right URETEROSCOPY AND STENT PLACEMENT, stone extraction;  Surgeon: Oliva VEAR Oiler, MD;  Location: [facility];  Service: Urology;  Laterality: Right;   CYSTOSCOPY WITH STENT PLACEMENT Right 06/04/2013    Procedure: CYSTOSCOPY WITH STENT PLACEMENT;  Surgeon: Garnette Shack, MD;  Location: [facility] ORS;  Service:  Urology;  Laterality: Right;   DILATATION & CURETTAGE/HYSTEROSCOPY WITH MYOSURE N/A 04/27/2015    Procedure: DILATATION & CURETTAGE/HYSTEROSCOPY WITH MYOSURE;  Surgeon: Curlee VEAR Guan, MD;  Location: [facility] ORS;  Service: Gynecology;  Laterality: N/A;   ENDARTERECTOMY FEMORAL Left 06/08/2014    Procedure: Left Leg Common Femoral and External Iliac  Endartarectomy with patch Angioplasty;  Surgeon: Krystal JULIANNA Doing, MD;  Location: [facility] OR;  Service: Vascular;  Laterality: Left;   ENTEROSCOPY N/A 04/09/2023    Procedure: ENTEROSCOPY;  Surgeon: Legrand Victory LITTIE DOUGLAS, MD;  Location: [facility] ENDOSCOPY;  Service: Gastroenterology;  Laterality: N/A;  ESOPHAGOGASTRODUODENOSCOPY N/A 10/14/2018    Procedure: ESOPHAGOGASTRODUODENOSCOPY (EGD);  Surgeon: Wilhelmenia Aloha Raddle., MD;  Location: [facility] ENDOSCOPY;  Service: Gastroenterology;  Laterality: N/A;   ESOPHAGOGASTRODUODENOSCOPY (EGD) WITH PROPOFOL  N/A 11/06/2018    Procedure: ESOPHAGOGASTRODUODENOSCOPY (EGD) WITH PROPOFOL ;  Surgeon: Wilhelmenia Aloha Raddle., MD;  Location: [facility] ENDOSCOPY;  Service: Gastroenterology;  Laterality: N/A;  RFA   ESOPHAGOGASTRODUODENOSCOPY (EGD) WITH PROPOFOL  N/A 02/12/2019    Procedure: ESOPHAGOGASTRODUODENOSCOPY (EGD) WITH PROPOFOL ;  Surgeon: Wilhelmenia Aloha Raddle., MD;  Location: [facility] ENDOSCOPY;  Service: Gastroenterology;  Laterality: N/A;   ESOPHAGOGASTRODUODENOSCOPY (EGD) WITH PROPOFOL  N/A 08/04/2019    Procedure: ESOPHAGOGASTRODUODENOSCOPY (EGD) WITH PROPOFOL ;  Surgeon: Wilhelmenia Aloha Raddle., MD;  Location: [facility] ENDOSCOPY;  Service: Gastroenterology;  Laterality: N/A;  WITH RFA   EXTRACORPOREAL SHOCK WAVE LITHOTRIPSY Right 08-04-2013//   06-23-2013//   01-16-2011   EYE SURGERY Bilateral      cataract   FEMORAL-POPLITEAL BYPASS GRAFT Left 06/08/2014    Procedure: Left Leg Femoral -Popliteal Bypass Graft;  Surgeon: Krystal JULIANNA Doing, MD;  Location: [facility] OR;  Service: Vascular;  Laterality: Left;   FEMORAL-POPLITEAL BYPASS GRAFT Left 06/09/2014     Procedure: Left Femoral and Popliteal Exposure; Left Femoral to Anterior Tibial Bypass Graft using Propaten 6mm by 80cm Goretex Graft; Left Tibial Endarterectomy; Left Femoraland Popliteal Thrombectomy ;  Surgeon: Gaile LELON New, MD;  Location: [facility] OR;  Service: Vascular;  Laterality: Left;   FEMORAL-TIBIAL BYPASS GRAFT Right 03/09/2023    Procedure: RIGHT FEMORAL-POSTERIOR TIBIAL ARTERY BYPASS USING X 80CM PROPATEN GRAFT;  Surgeon: New Gaile LELON, MD;  Location: [facility] OR;  Service: Vascular;  Laterality: Right;   GI RADIOFREQUENCY ABLATION N/A 11/06/2018    Procedure: GI RADIOFREQUENCY ABLATION;  Surgeon: Wilhelmenia Aloha Raddle., MD;  Location: [facility] ENDOSCOPY;  Service: Gastroenterology;  Laterality: N/A;   GI RADIOFREQUENCY ABLATION N/A 02/12/2019    Procedure: GI RADIOFREQUENCY ABLATION;  Surgeon: Wilhelmenia Aloha Raddle., MD;  Location: [facility] ENDOSCOPY;  Service: Gastroenterology;  Laterality: N/A;   GI RADIOFREQUENCY ABLATION N/A 08/04/2019    Procedure: GI RADIOFREQUENCY ABLATION;  Surgeon: Wilhelmenia Aloha Raddle., MD;  Location: [facility] ENDOSCOPY;  Service: Gastroenterology;  Laterality: N/A;   GIVENS CAPSULE STUDY N/A 04/09/2023    Procedure: GIVENS CAPSULE STUDY;  Surgeon: Legrand Victory LITTIE DOUGLAS, MD;  Location: [facility] ENDOSCOPY;  Service: Gastroenterology;  Laterality: N/A;   HOLMIUM LASER APPLICATION Right 10/07/2013    Procedure: HOLMIUM LASER APPLICATION;  Surgeon: Oliva VEAR Oiler, MD;  Location: [facility];  Service: Urology;  Laterality: Right;   KIDNEY STONE SURGERY   07/2013    1-2 stones   LAPAROSCOPIC GASTRIC BANDING   05/29/2005   LITHOTRIPSY   2-3 times   LOWER EXTREMITY ANGIOGRAM N/A 06/04/2014    Procedure: LOWER EXTREMITY ANGIOGRAM;  Surgeon: Gaile LELON New, MD;  Location: [name] CATH LAB;  Service: Cardiovascular;  Laterality: N/A;   LOWER EXTREMITY ANGIOGRAPHY N/A 12/25/2023    Procedure: Lower Extremity Angiography;  Surgeon: New Gaile LELON, MD;  Location: [facility];   Service: Cardiovascular;  Laterality: N/A;   ORIF FIFTH METACARPAL FX  RIGHT HAND   04/21/2002   PATCH ANGIOPLASTY Right 03/09/2023    Procedure: VEIN PATCH ANGIOPLASTY TO RIGHT FEMORAL ARTERY;  Surgeon: New Gaile LELON, MD;  Location: [facility] OR;  Service: Vascular;  Laterality: Right;   POLYPECTOMY       REVISION AND RE-SITING LAP-BAND PORT   04/08/2010    W/  UPPER EGD   RIGHT KNEE PATELLECTOMY W/ REPAIR OF EXTENSOR  MECHANISM   04/14/2002   TEE WITHOUT CARDIOVERSION N/A 12/24/2019    Procedure: TRANSESOPHAGEAL ECHOCARDIOGRAM (TEE);  Surgeon: Shlomo Wilbert SAUNDERS, MD;  Location: [facility] ENDOSCOPY;  Service: Cardiovascular;  Laterality: N/A;   Toenail removed Left 05/21/2014    2nd toenail-  Dr. Christine                Current Outpatient Medications  Medication Sig Dispense Refill   aspirin  EC 325 MG tablet Take 325 mg by mouth daily.       Blood Glucose Monitoring Suppl (ONETOUCH VERIO IQ SYSTEM) w/Device KIT USE TO CHECK SUGAR DAILY 1 kit 0   Continuous Glucose Receiver (FREESTYLE LIBRE 3 READER) DEVI Use to check blood glucose levels with Freestyle Libre 3 Plus sensors. 1 each 0   Continuous Glucose Sensor (FREESTYLE LIBRE 3 PLUS SENSOR) MISC Change sensor every 15 days. 2 each 5   diltiazem  (CARDIZEM  CD) 120 MG 24 hr capsule Take 1 capsule (120 mg total) by mouth daily. 90 capsule 0   diltiazem  (CARDIZEM ) 30 MG tablet Take 30 mg by mouth every 4 (four) hours as needed (a-fib).       dorzolamide -timolol  (COSOPT ) 22.3-6.8 MG/ML ophthalmic solution Place 1 drop into the right eye 2 (two) times daily.        ezetimibe  (ZETIA ) 10 MG tablet Take 1 tablet by mouth once daily 90 tablet 0   glucose blood (ONETOUCH VERIO) test strip Use to check blood sugar 3x per day 300 strip 3   hydrALAZINE  (APRESOLINE ) 100 MG tablet Take 1 tablet (100 mg total) by mouth in the morning and at bedtime.       Insulin  Glargine (BASAGLAR  KWIKPEN) 100 UNIT/ML INJECT 50 UNITS UNDER THE SKIN DAILY 30 mL 5   insulin  lispro  (HUMALOG  KWIKPEN) 100 UNIT/ML KwikPen Inject 14 Units into the skin with breakfast, with lunch, and with evening meal. DIAL  14 UNITS AND INJECT UNDER THE SKIN THREE TIMES A DAY. 45 mL 0   Insulin  Pen Needle (PEN NEEDLES) 32G X 4 MM MISC Inject 1 pen  as directed 4 (four) times daily. Use to inject levemir  or novolog  as directed. 400 each 3   latanoprost  (XALATAN ) 0.005 % ophthalmic solution Place 1 drop into both eyes at bedtime.        losartan  (COZAAR ) 100 MG tablet Take 1 tablet by mouth once daily 90 tablet 0   MAGNESIUM -OXIDE 400 (240 Mg) MG tablet Take 1 tablet by mouth once daily 90 tablet 0   nebivolol  (BYSTOLIC ) 10 MG tablet Take 1 tablet by mouth once daily 90 tablet 3   pantoprazole  (PROTONIX ) 40 MG tablet Take 1 tablet by mouth twice daily 180 tablet 0   rivaroxaban  (XARELTO ) 20 MG TABS tablet TAKE 1 TABLET BY MOUTH ONCE DAILY 90 tablet 0   rosuvastatin  (CRESTOR ) 5 MG tablet Take 1 tablet by mouth once daily 90 tablet 3   Semaglutide , 1 MG/DOSE, (OZEMPIC , 1 MG/DOSE,) 4 MG/3ML SOPN Inject 1 mg into the skin once a week. Via [employer] patient assistance 9 mL 1   SYNJARDY  5-500 MG TABS TAKE 1 TABLET BY MOUTH TWICE DAILY 180 tablet 1   torsemide  (DEMADEX ) 20 MG tablet Take 1 tablet (20 mg total) by mouth daily. 90 tablet 0      No current facility-administered medications for this encounter.        Allergies       Allergies  Allergen Reactions   Amlodipine  Swelling   Atorvastatin  Other (See Comments)  Muscle aches    Ampicillin  Nausea And Vomiting and Other (See Comments)   Codeine Nausea And Vomiting      Ask patient   Lisinopril Cough          Penicillins Nausea And Vomiting      Did it involve swelling of the face/tongue/throat, SOB, or low BP? No Did it involve sudden or severe rash/hives, skin peeling, or any reaction on the inside of your mouth or nose? No Did you need to seek medical attention at a hospital or doctor's office? No When did it last happen?      20+  years If all above answers are NO, may proceed with cephalosporin use.     Januvia  [Sitagliptin ] Nausea And Vomiting      ROS- All systems are reviewed and negative except as per the HPI above   Physical Exam:    Vitals:    01/16/24 1459  BP: 94/64  Pulse: (!) 131  Weight: 79.4 kg  Height: 5' 5 (1.651 m)           Wt Readings from Last 3 Encounters:  01/16/24 79.4 kg  12/25/23 78 kg  12/13/23 77.4 kg      Labs: Recent Labs       Lab Results  Component Value Date    NA 143 12/25/2023    K 3.4 (L) 12/25/2023    CL 105 12/25/2023    CO2 24 10/12/2023    GLUCOSE 127 (H) 12/25/2023    BUN 11 12/25/2023    CREATININE 0.70 12/25/2023    CALCIUM  9.5 10/12/2023    MG 1.6 (L) 04/11/2023      Recent Labs       Lab Results  Component Value Date    INR 1.0 03/09/2023      Recent Labs       Lab Results  Component Value Date    CHOL 118 10/08/2023    HDL 46.60 10/08/2023    LDLCALC 38 10/08/2023    TRIG 164.0 (H) 10/08/2023      GEN- The patient is well appearing, alert and oriented x 3 today.   Neck - no JVD or carotid bruit noted Lungs- Clear to ausculation bilaterally, normal work of breathing Heart- Tachycardic regular rate (atrial flutter), no murmurs, rubs or gallops, PMI not laterally displaced Extremities- no clubbing, cyanosis, or edema Skin - no rash or ecchymosis noted     EKG today demonstrates Vent. rate 131 BPM PR interval * ms QRS duration 86 ms QT/QTcB 352/519 ms P-R-T axes * 38 74 Atrial flutter with 2:1 A-V conduction Cannot rule out Anterior infarct (cited on or before 15-Nov-2023) Abnormal ECG When compared with ECG of 15-Nov-2023 10:39, Atrial flutter has replaced Sinus rhythm     Echo 09/26/23:  1. Left ventricular ejection fraction, by estimation, is 60 to 65%. The  left ventricle has normal function. Left ventricular endocardial border  not optimally defined to evaluate regional wall motion. Left ventricular  diastolic  parameters are  indeterminate.   2. Right ventricular systolic function is normal. The right ventricular  size is mildly enlarged.   3. Left atrial size was severely dilated.   4. 2D Planimetry of 2.34 cm2. Continuity equation MVA 1.32 cm . The  mitral valve is degenerative. No evidence of mitral valve regurgitation.  Mild to moderate mitral stenosis. The mean mitral valve gradient is 5.0  mmHg with average heart rate of 61 bpm.   Severe mitral annular calcification.  5. The aortic valve is tricuspid. There is mild calcification of the  aortic valve. Aortic valve regurgitation is not visualized. Aortic valve  sclerosis is present, with no evidence of aortic valve stenosis.   6. Dilated pulmonary artery.   7. The inferior vena cava is normal in size with greater than 50%  respiratory variability, suggesting right atrial pressure of 3 mmHg.      CHA2DS2-VASc Score = 4  The patient's score is based upon: CHF History: 0 HTN History: 1 Diabetes History: 0 Stroke History: 0 Vascular Disease History: 1 Age Score: 1 Gender Score: 1         ASSESSMENT AND PLAN: 1. Persistent Atrial Fibrillation/atrial flutter The patient's CHA2DS2-VASc score is 4, indicating a 4.8% annual risk of stroke.   S/p Afib ablation 08/02/21 by Dr. Inocencio. S/p successful DCCV on 11/05/23.   Patient is currently in atrial flutter with RVR. Patient has a Qtc in sinus rhythm that appears prohibitive of Tikosyn and Multaq. After discussion with EP, will begin amiodarone  load of 200 mg BID x 4 weeks then transition to 200 mg once daily. Amiodarone  will be temporary as a bridge to ablation. Given now ERAF a few months apart, will likely have patient follow up with EP to discuss repeat ablation. For now, we discussed cardioversion as a procedure to try to restore normal rhythm. We discussed the procedure cardioversion to try to convert to NSR. We discussed the risks vs benefits of this procedure and how ultimately we  cannot predict whether a patient will have early return of arrhythmia post procedure. After discussion, the patient wishes to proceed with cardioversion. Labs drawn today. Patient advised to hold Ozempic . Will need to hold Synjardy  3 days prior to cardioversion.    Informed Consent Shared Decision Making/Informed Consent The risks (stroke, cardiac arrhythmias rarely resulting in the need for a temporary or permanent pacemaker, skin irritation or burns and complications associated with conscious sedation including aspiration, arrhythmia, respiratory failure and death), benefits (restoration of normal sinus rhythm) and alternatives of a direct current cardioversion were explained in detail to Ms. Dasie and she agrees to proceed.        2. Secondary Hypercoagulable State (ICD10:  D68.69) The patient is at significant risk for stroke/thromboembolism based upon her CHA2DS2-VASc Score of 4.  Continue Rivaroxaban  (Xarelto ).  No missed doses.    3. HTN Hypotensive today. I cannot increase rate control.    4. CAD CAC score on CT 704 On statin No chest pain.    5. PAD S/p fem pop bypass 2016 S/p angiogram 12/25/23 no intervention continue medication mgmt Followed by [facility]   6. VHD Mild-moderate MS Followed by Dr Jeffrie     Follow up after DCCV with Dr. Inocencio.      Theresa Heinrich, PA-C Afib Clinic [facility] [address] [place], [place] [address] [phone]       For DCCV; compliant with xarelto ; no changes.  Redell Shallow

## 2024-01-24 NOTE — Discharge Instructions (Signed)

## 2024-01-25 ENCOUNTER — Encounter (HOSPITAL_COMMUNITY): Payer: Self-pay | Admitting: Cardiology

## 2024-01-25 NOTE — Anesthesia Postprocedure Evaluation (Signed)
 Anesthesia Post Note  Patient: Bryannah Kashuba  Procedure(s) Performed: CARDIOVERSION     Patient location during evaluation: PACU Anesthesia Type: General Level of consciousness: awake and alert Pain management: pain level controlled Vital Signs Assessment: post-procedure vital signs reviewed and stable Respiratory status: spontaneous breathing, nonlabored ventilation, respiratory function stable and patient connected to nasal cannula oxygen  Cardiovascular status: blood pressure returned to baseline and stable Postop Assessment: no apparent nausea or vomiting Anesthetic complications: no   There were no known notable events for this encounter.  Last Vitals:  Vitals:   01/24/24 1121 01/24/24 1131  BP: (!) 101/57 (!) 100/56  Pulse: (!) 53 (!) 52  Resp: 20 13  Temp:    SpO2: 95% 95%    Last Pain:  Vitals:   01/24/24 1131  TempSrc:   PainSc: 0-No pain                 Cordella P Evelio Rueda

## 2024-02-01 ENCOUNTER — Encounter: Payer: Self-pay | Admitting: Pharmacist

## 2024-02-02 ENCOUNTER — Other Ambulatory Visit: Payer: Self-pay | Admitting: Internal Medicine

## 2024-02-02 DIAGNOSIS — I4891 Unspecified atrial fibrillation: Secondary | ICD-10-CM

## 2024-02-07 ENCOUNTER — Ambulatory Visit: Attending: Cardiology | Admitting: Cardiology

## 2024-02-07 ENCOUNTER — Encounter: Payer: Self-pay | Admitting: Cardiology

## 2024-02-07 VITALS — BP 150/84 | HR 53 | Ht 65.0 in | Wt 171.0 lb

## 2024-02-07 DIAGNOSIS — I2510 Atherosclerotic heart disease of native coronary artery without angina pectoris: Secondary | ICD-10-CM | POA: Diagnosis not present

## 2024-02-07 DIAGNOSIS — I4819 Other persistent atrial fibrillation: Secondary | ICD-10-CM | POA: Diagnosis not present

## 2024-02-07 DIAGNOSIS — Z01812 Encounter for preprocedural laboratory examination: Secondary | ICD-10-CM

## 2024-02-07 DIAGNOSIS — I484 Atypical atrial flutter: Secondary | ICD-10-CM

## 2024-02-07 NOTE — Patient Instructions (Signed)
 Medication Instructions:  Your physician recommends that you continue on your current medications as directed. Please refer to the Current Medication list given to you today.  *If you need a refill on your cardiac medications before your next appointment, please call your pharmacy*   Lab Work: Pre procedure labs -- we will call you to schedule:  BMP & CBC  If you have a lab test that is abnormal and we need to change your treatment, we will call you to review the results -- otherwise no news is good news.    Testing/Procedures: Your physician has requested that you have cardiac CT 3 weeks PRIOR to your ablation. Cardiac computed tomography (CT) is a painless test that uses an x-ray machine to take clear, detailed pictures of your heart. We will contact you if the result is abnormal. We will call you to schedule.  Your physician has recommended that you have an ablation. Catheter ablation is a medical procedure used to treat some cardiac arrhythmias (irregular heartbeats). During catheter ablation, a long, thin, flexible tube is put into a blood vessel in your groin (upper thigh), or neck. This tube is called an ablation catheter. It is then guided to your heart through the blood vessel. Radio frequency waves destroy small areas of heart tissue where abnormal heartbeats may cause an arrhythmia to start. Please review the information below on ablation and after care.  Your ablation is scheduled for 1/08/20262. Please arrive at [facility] at 5:30 am.  We will call/send instructions at a later date.   Follow-Up: At [facility], you and your health needs are our priority.  As part of our continuing mission to provide you with exceptional heart care, we have created designated Provider Care Teams.  These Care Teams include your primary Cardiologist (physician) and Advanced Practice Providers (APPs -  Physician Assistants and Nurse Practitioners) who all work together to provide you with  the care you need, when you need it.  Your next appointment:   1 month(s) after your ablation  The format for your next appointment:   In Person  Provider:   AFib clinic   Thank you for choosing [facility]!!   Maeola Domino, RN [phone]    Other Instructions   Cardiac Ablation Cardiac ablation is a procedure to destroy (ablate) some heart tissue that is sending bad signals. These bad signals cause problems in heart rhythm. The heart has many areas that make these signals. If there are problems in these areas, they can make the heart beat in a way that is not normal. Destroying some tissues can help make the heart rhythm normal. Tell your doctor about: Any allergies you have. All medicines you are taking. These include vitamins, herbs, eye drops, creams, and over-the-counter medicines. Any problems you or family members have had with medicines that make you fall asleep (anesthetics). Any blood disorders you have. Any surgeries you have had. Any medical conditions you have, such as kidney failure. Whether you are pregnant or may be pregnant. What are the risks? This is a safe procedure. But problems may occur, including: Infection. Bruising and bleeding. Bleeding into the chest. Stroke or blood clots. Damage to nearby areas of your body. Allergies to medicines or dyes. The need for a pacemaker if the normal system is damaged. Failure of the procedure to treat the problem. What happens before the procedure? Medicines Ask your doctor about: Changing or stopping your normal medicines. This is important. Taking aspirin  and ibuprofen . Do  not take these medicines unless your doctor tells you to take them. Taking other medicines, vitamins, herbs, and supplements. General instructions Follow instructions from your doctor about what you cannot eat or drink. Plan to have someone take you home from the hospital or clinic. If you will be going home right after the  procedure, plan to have someone with you for 24 hours. Ask your doctor what steps will be taken to prevent infection. What happens during the procedure?  An IV tube will be put into one of your veins. You will be given a medicine to help you relax. The skin on your neck or groin will be numbed. A cut (incision) will be made in your neck or groin. A needle will be put through your cut and into a large vein. A tube (catheter) will be put into the needle. The tube will be moved to your heart. Dye may be put through the tube. This helps your doctor see your heart. Small devices (electrodes) on the tube will send out signals. A type of energy will be used to destroy some heart tissue. The tube will be taken out. Pressure will be held on your cut. This helps stop bleeding. A bandage will be put over your cut. The exact procedure may vary among doctors and hospitals. What happens after the procedure? You will be watched until you leave the hospital or clinic. This includes checking your heart rate, breathing rate, oxygen , and blood pressure. Your cut will be watched for bleeding. You will need to lie still for a few hours. Do not drive for 24 hours or as long as your doctor tells you. Summary Cardiac ablation is a procedure to destroy some heart tissue. This is done to treat heart rhythm problems. Tell your doctor about any medical conditions you may have. Tell him or her about all medicines you are taking to treat them. This is a safe procedure. But problems may occur. These include infection, bruising, bleeding, and damage to nearby areas of your body. Follow what your doctor tells you about food and drink. You may also be told to change or stop some of your medicines. After the procedure, do not drive for 24 hours or as long as your doctor tells you. This information is not intended to replace advice given to you by your health care provider. Make sure you discuss any questions you have with  your health care provider. Document Revised: 07/08/2021 Document Reviewed: 03/20/2019 Elsevier Patient Education  2023 [employer] Inc[employer]   Cardiac Ablation, Care After  This sheet gives you information about how to care for yourself after your procedure. Your health care provider may also give you more specific instructions. If you have problems or questions, contact your health care provider. What can I expect after the procedure? After the procedure, it is common to have: Bruising around your puncture site. Tenderness around your puncture site. Skipped heartbeats. If you had an atrial fibrillation ablation, you may have atrial fibrillation during the first several months after your procedure.  Tiredness (fatigue).  Follow these instructions at home: Puncture site care  Follow instructions from your health care provider about how to take care of your puncture site. Make sure you: If present, leave stitches (sutures), skin glue, or adhesive strips in place. These skin closures may need to stay in place for up to 2 weeks. If adhesive strip edges start to loosen and curl up, you may trim the loose edges. Do not remove adhesive strips completely  unless your health care provider tells you to do that. If a large square bandage is present, this may be removed 24 hours after surgery.  Check your puncture site every day for signs of infection. Check for: Redness, swelling, or pain. Fluid or blood. If your puncture site starts to bleed, lie down on your back, apply firm pressure to the area, and contact your health care provider. Warmth. Pus or a bad smell. A pea or marble sized lump/knot at the site is normal and can take up to three months to resolve.  Driving Do not drive for at least 4 days after your procedure or however long your health care provider recommends. (Do not resume driving if you have previously been instructed not to drive for other health reasons.) Do not drive or use heavy  machinery while taking prescription pain medicine. Activity Avoid activities that take a lot of effort for at least 7 days after your procedure. Do not lift anything that is heavier than 5 lb (4.5 kg) for one week.  No sexual activity for 1 week.  Return to your normal activities as told by your health care provider. Ask your health care provider what activities are safe for you. General instructions Take over-the-counter and prescription medicines only as told by your health care provider. Do not use any products that contain nicotine or tobacco, such as cigarettes and e-cigarettes. If you need help quitting, ask your health care provider. You may shower after 24 hours, but Do not take baths, swim, or use a hot tub for 1 week.  Do not drink alcohol for 24 hours after your procedure. Keep all follow-up visits as told by your health care provider. This is important. Contact a health care provider if: You have redness, mild swelling, or pain around your puncture site. You have fluid or blood coming from your puncture site that stops after applying firm pressure to the area. Your puncture site feels warm to the touch. You have pus or a bad smell coming from your puncture site. You have a fever. You have chest pain or discomfort that spreads to your neck, jaw, or arm. You have chest pain that is worse with lying on your back or taking a deep breath. You are sweating a lot. You feel nauseous. You have a fast or irregular heartbeat. You have shortness of breath. You are dizzy or light-headed and feel the need to lie down. You have pain or numbness in the arm or leg closest to your puncture site. Get help right away if: Your puncture site suddenly swells. Your puncture site is bleeding and the bleeding does not stop after applying firm pressure to the area. These symptoms may represent a serious problem that is an emergency. Do not wait to see if the symptoms will go away. Get medical help  right away. Call your local emergency services (911 in the [place] Do not drive yourself to the hospital. Summary After the procedure, it is normal to have bruising and tenderness at the puncture site in your groin, neck, or forearm. Check your puncture site every day for signs of infection. Get help right away if your puncture site is bleeding and the bleeding does not stop after applying firm pressure to the area. This is a medical emergency. This information is not intended to replace advice given to you by your health care provider. Make sure you discuss any questions you have with your health care provider.

## 2024-02-07 NOTE — Progress Notes (Signed)
 Electrophysiology Office Note:   Date:  02/07/2024  ID:  Theresa Harrington, DOB 1955/08/12, MRN [id number]  Primary Cardiologist: Oneil Parchment, MD Primary Heart Failure: None Electrophysiologist: Edwing Figley Theresa Norton, MD      History of Present Illness:   Theresa Harrington is a 68 y.o. female with h/o PAD post femoropopliteal bypass in 2016, atrial fibrillation, diabetes, coronary artery disease, hypertension seen today for routine electrophysiology followup.   Follow-up in A-fib clinic 10/23/2023 showed atrial flutter.  She is post cardioversion 01/24/2024.  She is post atrial fibrillation ablation 08/02/2021.  Since last being seen in our clinic the patient reports doing well.  She has noted no further episodes of atrial fibrillation or flutter since her cardioversion.  She is on amiodarone .  She would prefer this to be a short-term medication due to long-term side effects.  she denies chest pain, palpitations, dyspnea, PND, orthopnea, nausea, vomiting, dizziness, syncope, edema, weight gain, or early satiety.   Review of systems complete and found to be negative unless listed in HPI.   EP Information / Studies Reviewed:    EKG is ordered today. Personal review as below.      Risk Assessment/Calculations:    CHA2DS2-VASc Score = 4   This indicates a 4.8% annual risk of stroke. The patient's score is based upon: CHF History: 0 HTN History: 1 Diabetes History: 0 Stroke History: 0 Vascular Disease History: 1 Age Score: 1 Gender Score: 1            Physical Exam:   VS:  BP (!) 150/84 (BP Location: Right Arm, Patient Position: Sitting, Cuff Size: Normal)   Pulse (!) 53   Ht 5' 5 (1.651 m)   Wt 171 lb (77.6 kg)   LMP  (LMP Unknown)   SpO2 100%   BMI 28.46 kg/m    Wt Readings from Last 3 Encounters:  02/07/24 171 lb (77.6 kg)  01/16/24 175 lb (79.4 kg)  12/25/23 172 lb (78 kg)     GEN: Well nourished, well developed in no acute distress NECK: No JVD; No carotid bruits CARDIAC:  Regular rate and rhythm, no murmurs, rubs, gallops RESPIRATORY:  Clear to auscultation without rales, wheezing or rhonchi  ABDOMEN: Soft, non-tender, non-distended EXTREMITIES:  No edema; No deformity   ASSESSMENT AND PLAN:    1.  Persistent atrial fibrillation/flutter: Post ablation for atrial fibrillation 08/02/2021.  Did return in atrial flutter.  Is now post cardioversion.  On amiodarone .  She would prefer amiodarone  to be a short-term medication.  She presented in atrial flutter.  Based on her EKG, it is difficult to tell whether her atrial flutter is typical versus atypical.  Due to that, we Canna Nickelson need to recheck the atrial fibrillation ablation that was previously done as well as ablate her CTI.  Risk, benefits, and alternatives to EP study and radiofrequency/pulse field ablation for afib were also discussed in detail today. These risks include but are not limited to stroke, bleeding, vascular damage, tamponade, perforation, damage to the esophagus, lungs, and other structures, pulmonary vein stenosis, worsening renal function, and death. The patient understands these risk and wishes to proceed.  We Trini Christiansen therefore proceed with catheter ablation at the next available time.  Carto, ICE, anesthesia are requested for the procedure.  This patient Draydon Clairmont require CT prior to ablation. To be scheduled.   2.  Secondary hypercoagulable state: On Xarelto   3.  Hypertension: Plan per primary cardiology  4.  Coronary disease: Elevated calcium  score.  Continue statin.  5.  PAD: Post femoropopliteal bypass in 2016.  Managed by vascular surgery  6.  Mild to moderate mitral stenosis: Plan per primary cardiology  Follow up with Afib Clinic as usual post procedure  Signed, Trystan Akhtar Theresa Norton, MD

## 2024-02-07 NOTE — Addendum Note (Signed)
 Addended by: GRETEL MAEOLA CROME on: 02/07/2024 12:40 PM   Modules accepted: Orders

## 2024-02-10 ENCOUNTER — Other Ambulatory Visit (HOSPITAL_COMMUNITY): Payer: Self-pay | Admitting: Internal Medicine

## 2024-02-11 ENCOUNTER — Encounter (HOSPITAL_COMMUNITY)

## 2024-02-11 ENCOUNTER — Encounter

## 2024-02-13 ENCOUNTER — Other Ambulatory Visit: Payer: Self-pay | Admitting: Internal Medicine

## 2024-02-13 DIAGNOSIS — K2100 Gastro-esophageal reflux disease with esophagitis, without bleeding: Secondary | ICD-10-CM

## 2024-02-13 DIAGNOSIS — K5904 Chronic idiopathic constipation: Secondary | ICD-10-CM

## 2024-02-19 ENCOUNTER — Ambulatory Visit (HOSPITAL_COMMUNITY): Admitting: Internal Medicine

## 2024-02-20 ENCOUNTER — Other Ambulatory Visit: Payer: Self-pay | Admitting: Internal Medicine

## 2024-02-20 DIAGNOSIS — E785 Hyperlipidemia, unspecified: Secondary | ICD-10-CM

## 2024-02-26 ENCOUNTER — Telehealth: Payer: Self-pay

## 2024-02-26 NOTE — Telephone Encounter (Signed)
 Gave pt a call pt is coming up due for re-enrollment on [employer] and [employer]ard[employer]  pt ask to mail out pap but was not sure she can get proof of income from social secury due to the shut down pt is going to tried to get proof of income if not she wants to wait untul the beginning of the year,pt knows Ozempic  is not covered by [employer].

## 2024-03-03 ENCOUNTER — Ambulatory Visit: Attending: Surgery | Admitting: Physician Assistant

## 2024-03-03 ENCOUNTER — Ambulatory Visit (HOSPITAL_COMMUNITY)
Admission: RE | Admit: 2024-03-03 | Discharge: 2024-03-03 | Disposition: A | Discharge status: Home / Self Care | Source: Ambulatory Visit | Attending: Surgery | Admitting: Surgery

## 2024-03-03 VITALS — BP 175/75 | HR 48 | Temp 98.2°F | Wt 169.2 lb

## 2024-03-03 DIAGNOSIS — I739 Peripheral vascular disease, unspecified: Secondary | ICD-10-CM | POA: Insufficient documentation

## 2024-03-03 DIAGNOSIS — I70213 Atherosclerosis of native arteries of extremities with intermittent claudication, bilateral legs: Secondary | ICD-10-CM | POA: Diagnosis not present

## 2024-03-03 LAB — VAS US ABI WITH/WO TBI
Left ABI: 0.74
Right ABI: 0.43

## 2024-03-03 NOTE — Progress Notes (Signed)
 Office Note     CC:  follow up Requesting Provider:  Joshua Debby CROME, MD  HPI: Theresa Harrington is a 68 y.o. (May 22, 1955) female who presents for follow up of PAD. She has history of bilateral lower extremity bypass grafts. The left is chronically occluded. She recently underwent Angiogram of BLE to evaluate anastomotic stenosis seen on duplex evaluation of the right lower extremity bypass. At time of Angiogram bilateral femoral tibial bypass grafts were occluded. No intervention was indicated because she was asymptomatic.  She was noted to have reconstruction distally of PT arteries bilaterally.   Today she reports that she has been having pain in her legs on ambulation, right > left. She says she can walk a pretty good distance before this occurs. She says at work her parking lot is pretty large and she is able to walk it several times before she has to stop and rest. She explains that she was not experiencing any of this until after per procedure. She denies any pain at rest. No tissue loss. She is medically managed on Aspirin , statin, and Xarelto   Past Medical History:  Diagnosis Date   Allergy    Anxiety    Pt. denies   Arthritis    right index figer   Asymptomatic cholelithiasis    Atherosclerosis of aorta    Atrial flutter, paroxysmal (HCC) 02/20/2023   Cataract    Clotting disorder    Gallstones 06/2013   GERD (gastroesophageal reflux disease)    Glaucoma    A LITTLE BIT   History of kidney stones    lithotrispy   Hyperlipidemia    Hypertension    Hypothyroidism    no meds   Iron  deficiency anemia    PAD (peripheral artery disease)    PAF (paroxysmal atrial fibrillation) (HCC)    Peripheral arterial occlusive disease    lower extremities   Pneumonia    PONV (postoperative nausea and vomiting)    Right ureteral stone    Type 2 diabetes mellitus (HCC)    Type   Vitamin B 12 deficiency    Wears glasses     Past Surgical History:  Procedure Laterality Date    ABDOMINAL AORTOGRAM N/A 12/25/2023   Procedure: ABDOMINAL AORTOGRAM;  Surgeon: Serene Gaile ORN, MD;  [facility];  Service: Cardiovascular;  Laterality: N/A;   ABDOMINAL AORTOGRAM W/LOWER EXTREMITY N/A 03/09/2020   Procedure: ABDOMINAL AORTOGRAM W/LOWER EXTREMITY;  Surgeon: Serene Gaile ORN, MD;  [facility]: [facility];  Service: Cardiovascular;  Laterality: N/A;   ABDOMINAL AORTOGRAM W/LOWER EXTREMITY N/A 03/06/2023   Procedure: ABDOMINAL AORTOGRAM W/LOWER EXTREMITY;  Surgeon: Serene Gaile ORN, MD;  [facility];  Service: Cardiovascular;  Laterality: N/A;   AMPUTATION Left 07/16/2014   Procedure: LEFT SECOND TOE AMPUTATION;  Surgeon: Gaile ORN Serene, MD;  Location: [facility] OR;  Service: Vascular;  Laterality: Left;  With Nerve block   ATRIAL FIBRILLATION ABLATION N/A 08/02/2021   Procedure: ATRIAL FIBRILLATION ABLATION;  Surgeon: Inocencio Soyla Lunger, MD;  [facility];  Service: Cardiovascular;  Laterality: N/A;   AUGMENTATION MAMMAPLASTY     BELPHAROPTOSIS REPAIR     eyelid lift   BIOPSY  10/14/2018   Procedure: BIOPSY;  Surgeon: Wilhelmenia Aloha Raddle., MD;  Location: [facility] ENDOSCOPY;  Service: Gastroenterology;;   BIOPSY  08/04/2019   Procedure: BIOPSY;  Surgeon: Wilhelmenia Aloha Raddle., MD;  Location: [facility] ENDOSCOPY;  Service: Gastroenterology;;   CARDIOVERSION N/A 09/19/2019   Procedure: CARDIOVERSION;  Surgeon: Lonni Slain, MD;  Location: [facility] ENDOSCOPY;  Service: Cardiovascular;  Laterality: N/A;   CARDIOVERSION N/A 11/05/2023   Procedure: CARDIOVERSION;  Surgeon: Mona Vinie BROCKS, MD;  [facility];  Service: Cardiovascular;  Laterality: N/A;   CARDIOVERSION N/A 01/24/2024   Procedure: CARDIOVERSION;  Surgeon: Pietro Redell RAMAN, MD;  [facility];  Service: Cardiovascular;  Laterality: N/A;   COLONOSCOPY     COLONOSCOPY WITH PROPOFOL  Left 04/11/2023   Procedure: COLONOSCOPY WITH PROPOFOL ;  Surgeon:  Legrand Victory LITTIE DOUGLAS, MD;  Location: [facility] ENDOSCOPY;  Service: Gastroenterology;  Laterality: Left;   COMBINED AUGMENTATION MAMMAPLASTY AND ABDOMINOPLASTY  2009   W/  BILATERAL  THIGH LIFT   CYSTO/  RIGHT URETERAL STENT PLACEMENT  12/30/2010   CYSTOSCOPY WITH RETROGRADE PYELOGRAM, URETEROSCOPY AND STENT PLACEMENT Right 10/07/2013   Procedure: CYSTOSCOPY WITH RETROGRADE PYELOGRAM, right URETEROSCOPY AND STENT PLACEMENT, stone extraction;  Surgeon: Oliva VEAR Oiler, MD;  Location: [facility];  Service: Urology;  Laterality: Right;   CYSTOSCOPY WITH STENT PLACEMENT Right 06/04/2013   Procedure: CYSTOSCOPY WITH STENT PLACEMENT;  Surgeon: Garnette Shack, MD;  Location: [facility]S;  Service: Urology;  Laterality: Right;   DILATATION & CURETTAGE/HYSTEROSCOPY WITH MYOSURE N/A 04/27/2015   Procedure: DILATATION & CURETTAGE/HYSTEROSCOPY WITH MYOSURE;  Surgeon: Curlee VEAR Guan, MD;  Location: [facility];  Service: Gynecology;  Laterality: N/A;   ENDARTERECTOMY FEMORAL Left 06/08/2014   Procedure: Left Leg Common Femoral and External Iliac  Endartarectomy with patch Angioplasty;  Surgeon: Krystal JULIANNA Doing, MD;  Location: [facility] OR;  Service: Vascular;  Laterality: Left;   ENTEROSCOPY N/A 04/09/2023   Procedure: ENTEROSCOPY;  Surgeon: Legrand Victory LITTIE DOUGLAS, MD;  Location: [facility] ENDOSCOPY;  Service: Gastroenterology;  Laterality: N/A;   ESOPHAGOGASTRODUODENOSCOPY N/A 10/14/2018   Procedure: ESOPHAGOGASTRODUODENOSCOPY (EGD);  Surgeon: Wilhelmenia Aloha Raddle., MD;  Location: [facility] ENDOSCOPY;  Service: Gastroenterology;  Laterality: N/A;   ESOPHAGOGASTRODUODENOSCOPY (EGD) WITH PROPOFOL  N/A 11/06/2018   Procedure: ESOPHAGOGASTRODUODENOSCOPY (EGD) WITH PROPOFOL ;  Surgeon: Mansouraty, Aloha Raddle., MD;  Location: [facility]OS[facility];  Service: Gastroenterology;  Laterality: N/A;  RFA   ESOPHAGOGASTRODUODENOSCOPY (EGD) WITH PROPOFOL  N/A 02/12/2019   Procedure: ESOPHAGOGASTRODUODENOSCOPY (EGD) WITH PROPOFOL ;  Surgeon: Wilhelmenia Aloha Raddle., MD;  Location: [facility]OS[facility];  Service: Gastroenterology;  Laterality: N/A;   ESOPHAGOGASTRODUODENOSCOPY (EGD) WITH PROPOFOL  N/A 08/04/2019   Procedure: ESOPHAGOGASTRODUODENOSCOPY (EGD) WITH PROPOFOL ;  Surgeon: Wilhelmenia Aloha Raddle., MD;  Location: [facility] ENDOSCOPY;  Service: Gastroenterology;  Laterality: N/A;  WITH RFA   EXTRACORPOREAL SHOCK WAVE LITHOTRIPSY Right 08-04-2013//   06-23-2013//   01-16-2011   EYE SURGERY Bilateral    cataract   FEMORAL-POPLITEAL BYPASS GRAFT Left 06/08/2014   Procedure: Left Leg Femoral -Popliteal Bypass Graft;  Surgeon: Krystal JULIANNA Doing, MD;  Location: [facility] OR;  Service: Vascular;  Laterality: Left;   FEMORAL-POPLITEAL BYPASS GRAFT Left 06/09/2014   Procedure: Left Femoral and Popliteal Exposure; Left Femoral to Anterior Tibial Bypass Graft using Propaten 6mm by 80cm Goretex Graft; Left Tibial Endarterectomy; Left Femoraland Popliteal Thrombectomy ;  Surgeon: Gaile LELON New, MD;  Location: [facility] OR;  Service: Vascular;  Laterality: Left;   FEMORAL-TIBIAL BYPASS GRAFT Right 03/09/2023   Procedure: RIGHT FEMORAL-POSTERIOR TIBIAL ARTERY BYPASS USING X 80CM PROPATEN GRAFT;  Surgeon: New Gaile LELON, MD;  Location: [facility];  Service: Vascular;  Laterality: Right;   GI RADIOFREQUENCY ABLATION N/A 11/06/2018   Procedure: GI RADIOFREQUENCY ABLATION;  Surgeon: Wilhelmenia Aloha Raddle., MD;  Location: [facility];  Service: Gastroenterology;  Laterality: N/A;  GI RADIOFREQUENCY ABLATION N/A 02/12/2019   Procedure: GI RADIOFREQUENCY ABLATION;  Surgeon: Wilhelmenia Aloha Raddle., MD;  Location: [facility] ENDOSCOPY;  Service: Gastroenterology;  Laterality: N/A;   GI RADIOFREQUENCY ABLATION N/A 08/04/2019   Procedure: GI RADIOFREQUENCY ABLATION;  Surgeon: Wilhelmenia Aloha Raddle., MD;  Location: [facility] ENDOSCOPY;  Service: Gastroenterology;  Laterality: N/A;   GIVENS CAPSULE STUDY N/A 04/09/2023   Procedure: GIVENS CAPSULE STUDY;  Surgeon: Legrand Victory LITTIE DOUGLAS, MD;  Location: [facility] ENDOSCOPY;   Service: Gastroenterology;  Laterality: N/A;   HOLMIUM LASER APPLICATION Right 10/07/2013   Procedure: HOLMIUM LASER APPLICATION;  Surgeon: Oliva VEAR Oiler, MD;  Location: [facility];  Service: Urology;  Laterality: Right;   KIDNEY STONE SURGERY  07/2013   1-2 stones   LAPAROSCOPIC GASTRIC BANDING  05/29/2005   LITHOTRIPSY  2-3 times   LOWER EXTREMITY ANGIOGRAM N/A 06/04/2014   Procedure: LOWER EXTREMITY ANGIOGRAM;  Surgeon: Gaile LELON New, MD;  Location: [facility] CATH LAB;  Service: Cardiovascular;  Laterality: N/A;   LOWER EXTREMITY ANGIOGRAPHY N/A 12/25/2023   Procedure: Lower Extremity Angiography;  Surgeon: New Gaile LELON, MD;  [facility];  Service: Cardiovascular;  Laterality: N/A;   ORIF FIFTH METACARPAL FX  RIGHT HAND  04/21/2002   PATCH ANGIOPLASTY Right 03/09/2023   Procedure: VEIN PATCH ANGIOPLASTY TO RIGHT FEMORAL ARTERY;  Surgeon: New Gaile LELON, MD;  Location: [facility];  Service: Vascular;  Laterality: Right;   POLYPECTOMY     REVISION AND RE-SITING LAP-BAND PORT  04/08/2010   W/  UPPER EGD   RIGHT KNEE PATELLECTOMY W/ REPAIR OF EXTENSOR MECHANISM  04/14/2002   TEE WITHOUT CARDIOVERSION N/A 12/24/2019   Procedure: TRANSESOPHAGEAL ECHOCARDIOGRAM (TEE);  Surgeon: Shlomo Wilbert SAUNDERS, MD;  Location: [facility] ENDOSCOPY;  Service: Cardiovascular;  Laterality: N/A;   Toenail removed Left 05/21/2014   2nd toenail-  Dr. Christine    Social History   Socioeconomic History   Marital status: Single    Spouse name: Not on file   Number of children: Not on file   Years of education: Not on file   Highest education level: Not on file  Occupational History   Occupation: Higher Education Careers Adviser: [employer]  Tobacco Use   Smoking status: Former    Current packs/day: 0.00    Average packs/day: 1 pack/day for 28.0 years (28.0 ttl pk-yrs)    Types: Cigarettes    Start date: 09/05/1973    Quit date: 09/05/2001    Years since quitting: 22.5    Passive exposure: Never    Smokeless tobacco: Never   Tobacco comments:    Former smoker 01/16/24  Vaping Use   Vaping status: Never Used  Substance and Sexual Activity   Alcohol use: No    Alcohol/week: 0.0 standard drinks of alcohol   Drug use: No   Sexual activity: Yes  Other Topics Concern   Not on file  Social History Narrative   Regular exercise- yes   Lives alone/2025   Social Drivers of Health   Financial Resource Strain: Low Risk  (10/15/2023)   Overall Financial Resource Strain (CARDIA)    Difficulty of Paying Living Expenses: Not hard at all  Food Insecurity: Low Risk  (11/29/2023)   Received from [facility]   Hunger Vital Sign    Within the past 12 months, you worried that your food would run out before you got money to buy more: Never true    Within the past 12 months, the food  you bought just didn't last and you didn't have money to get more. : Never true  Transportation Needs: No Transportation Needs (11/29/2023)   [facility]   Transportation    In the past 12 months, has lack of reliable transportation kept you from medical appointments, meetings, work or from getting things needed for daily living? : No  Physical Activity: Sufficiently Active (10/15/2023)   Exercise Vital Sign    Days of Exercise per Week: 7 days    Minutes of Exercise per Session: 30 min  Stress: No Stress Concern Present (10/15/2023)   [employer] of Occupational Health - Occupational Stress Questionnaire    Feeling of Stress: Not at all  Social Connections: Moderately Integrated (10/15/2023)   Social Connection and Isolation Panel    Frequency of Communication with Friends and Family: More than three times a week    Frequency of Social Gatherings with Friends and Family: Twice a week    Attends Religious Services: More than 4 times per year    Active Member of [employer] or Organizations: Yes    Attends Banker Meetings: Never    Marital Status: Never married  Intimate Partner  Violence: Patient Unable To Answer (10/15/2023)   Humiliation, Afraid, Rape, and Kick questionnaire    Fear of Current or Ex-Partner: Patient unable to answer    Emotionally Abused: Patient unable to answer    Physically Abused: Patient unable to answer    Sexually Abused: Patient unable to answer    Family History  Problem Relation Age of Onset   Kidney disease Mother    Hyperlipidemia Mother    Other Mother        AAA   and    Amputation   Diabetes Father    Heart disease Father    Deep vein thrombosis Father    Hyperlipidemia Father    Diabetes Sister    Heart disease Sister    Deep vein thrombosis Sister    Hyperlipidemia Sister    Diabetes Brother    Heart disease Brother    Cancer Brother    Hyperlipidemia Brother    Diabetes Brother    Diabetes Brother    Colon polyps Brother    Colon cancer Maternal Aunt    Arthritis Other    Cancer Other        colon   Hypertension Other    Stroke Other    Esophageal cancer Neg Hx    Stomach cancer Neg Hx    Inflammatory bowel disease Neg Hx    Liver disease Neg Hx    Pancreatic cancer Neg Hx    Crohn's disease Neg Hx    Rectal cancer Neg Hx     Current Outpatient Medications  Medication Sig Dispense Refill   amiodarone  (PACERONE ) 200 MG tablet Take 1 tablet by mouth twice daily for 30 days 60 tablet 6   aspirin  EC 325 MG tablet Take 325 mg by mouth daily.     Blood Glucose Monitoring Suppl (ONETOUCH VERIO IQ SYSTEM) w/Device KIT USE TO CHECK SUGAR DAILY 1 kit 0   Continuous Glucose Receiver (FREESTYLE LIBRE 3 READER) DEVI Use to check blood glucose levels with Freestyle Libre 3 Plus sensors. 1 each 0   Continuous Glucose Sensor (FREESTYLE LIBRE 3 PLUS SENSOR) MISC Change sensor every 15 days. 2 each 5   diltiazem  (CARDIZEM  CD) 120 MG 24 hr capsule Take 1 capsule (120 mg total) by mouth daily. 90 capsule 0  diltiazem  (CARDIZEM ) 30 MG tablet Take 30 mg by mouth every 4 (four) hours as needed (a-fib).      dorzolamide -timolol  (COSOPT ) 22.3-6.8 MG/ML ophthalmic solution Place 1 drop into the right eye 2 (two) times daily.      ezetimibe  (ZETIA ) 10 MG tablet Take 1 tablet by mouth once daily 90 tablet 0   glucose blood (ONETOUCH VERIO) test strip Use to check blood sugar 3x per day 300 strip 3   hydrALAZINE  (APRESOLINE ) 100 MG tablet Take 1 tablet (100 mg total) by mouth in the morning and at bedtime.     Insulin  Glargine (BASAGLAR  KWIKPEN) 100 UNIT/ML INJECT 50 UNITS UNDER THE SKIN DAILY 30 mL 5   insulin  lispro (HUMALOG  KWIKPEN) 100 UNIT/ML KwikPen Inject 14 Units into the skin with breakfast, with lunch, and with evening meal. DIAL  14 UNITS AND INJECT UNDER THE SKIN THREE TIMES A DAY. 45 mL 0   Insulin  Pen Needle (PEN NEEDLES) 32G X 4 MM MISC Inject 1 pen  as directed 4 (four) times daily. Use to inject levemir  or novolog  as directed. 400 each 3   latanoprost  (XALATAN ) 0.005 % ophthalmic solution Place 1 drop into both eyes at bedtime.      losartan  (COZAAR ) 100 MG tablet Take 1 tablet by mouth once daily 90 tablet 0   MAGNESIUM -OXIDE 400 (240 Mg) MG tablet Take 1 tablet by mouth once daily 90 tablet 0   nebivolol  (BYSTOLIC ) 10 MG tablet Take 1 tablet by mouth once daily 90 tablet 3   pantoprazole  (PROTONIX ) 40 MG tablet Take 1 tablet by mouth twice daily 180 tablet 0   potassium chloride  SA (KLOR-CON  M) 20 MEQ tablet Take 1 tablet (20 mEq total) by mouth daily. Take once daily for 3 days 3 tablet 0   rosuvastatin  (CRESTOR ) 5 MG tablet Take 1 tablet by mouth once daily 90 tablet 3   Semaglutide , 1 MG/DOSE, (OZEMPIC , 1 MG/DOSE,) 4 MG/3ML SOPN Inject 1 mg into the skin once a week. Via [employer] patient assistance 9 mL 1   SYNJARDY  5-500 MG TABS TAKE 1 TABLET BY MOUTH TWICE DAILY 180 tablet 1   torsemide  (DEMADEX ) 20 MG tablet Take 1 tablet (20 mg total) by mouth daily. 90 tablet 0   XARELTO  20 MG TABS tablet Take 1 tablet by mouth once daily 90 tablet 0   No current facility-administered  medications for this visit.    Allergies  Allergen Reactions   Amlodipine  Swelling   Atorvastatin  Other (See Comments)    Muscle aches    Ampicillin  Nausea And Vomiting and Other (See Comments)   Codeine Nausea And Vomiting    Ask patient   Lisinopril Cough        Penicillins Nausea And Vomiting    Did it involve swelling of the face/tongue/throat, SOB, or low BP? No Did it involve sudden or severe rash/hives, skin peeling, or any reaction on the inside of your mouth or nose? No Did you need to seek medical attention at a hospital or doctor's office? No When did it last happen?      20+ years If all above answers are NO, may proceed with cephalosporin use.    Januvia  [Sitagliptin ] Nausea And Vomiting     REVIEW OF SYSTEMS:  Negative unless noted in HPI [X]  denotes positive finding, [ ]  denotes negative finding Cardiac  Comments:  Chest pain or chest pressure:    Shortness of breath upon exertion:    Short of breath when lying  flat:    Irregular heart rhythm:        Vascular    Pain in calf, thigh, or hip brought on by ambulation:    Pain in feet at night that wakes you up from your sleep:     Blood clot in your veins:    Leg swelling:         Pulmonary    Oxygen  at home:    Productive cough:     Wheezing:         Neurologic    Sudden weakness in arms or legs:     Sudden numbness in arms or legs:     Sudden onset of difficulty speaking or slurred speech:    Temporary loss of vision in one eye:     Problems with dizziness:         Gastrointestinal    Blood in stool:     Vomited blood:         Genitourinary    Burning when urinating:     Blood in urine:        Psychiatric    Major depression:         Hematologic    Bleeding problems:    Problems with blood clotting too easily:        Skin    Rashes or ulcers:        Constitutional    Fever or chills:      PHYSICAL EXAMINATION:  Vitals:   03/03/24 1354  BP: (!) 175/75  Pulse: (!) 48  Temp:  98.2 F (36.8 C)  TempSrc: Temporal  Weight: 169 lb 3.2 oz (76.7 kg)    General:  WDWN in NAD; vital signs documented above Gait: Normal HENT: WNL, normocephalic Pulmonary: normal non-labored breathing Cardiac: regular HR Abdomen: soft Vascular Exam/Pulses: Feet warm and well perfused, no palpable distal pulses Extremities: without ischemic changes, without Gangrene , without cellulitis; without open wounds;  Musculoskeletal: no muscle wasting or atrophy  Neurologic: A&O X 3 Psychiatric:  The pt has Normal affect.   Non-Invasive Vascular Imaging:   +-------+-----------+-----------+------------+------------+  ABI/TBIToday's ABIToday's TBIPrevious ABIPrevious TBI  +-------+-----------+-----------+------------+------------+  Right 0.43       0          [place]          0.36          +-------+-----------+-----------+------------+------------+  Left  0.74       0.3        0.86        0.37          +-------+-----------+-----------+------------+------------+    ASSESSMENT/PLAN:: 67 y.o. female here for follow up of PAD. She has history of bilateral lower extremity bypass grafts. The left is chronically occluded. She recently underwent Angiogram of BLE to evaluate anastomotic stenosis seen on duplex evaluation of the right lower extremity bypass. At time of Angiogram bilateral femoral tibial bypass grafts were occluded. No intervention was indicated because she was asymptomatic.  She was noted to have reconstruction distally of PT arteries bilaterally. She is now having mild claudication symptoms. No rest pain or tissue loss. I had long discussion with her on how to proceed with her management. We discussed that she is not indicated for any intervention at this time based on her current symptoms. We discussed that redoing her bypasses is an option, but for one we would not do that for mild claudication and also I explained that she needs to reserve all the options  she has for down  the road because if we were to proceed with a redo bypass now and that was to occlude she would likely not have any further options available and would be looking possibly at amputation as a future outcome. She voiced her understanding. We discussed the importance of a dedicated walking regimen. We discussed the importance of keeping her feet protected an wearing good shoes. She will continue her Aspirin , statin and Xarelto . She will follow up in 3 months with repeat ABI. She knows to call for earlier follow up if new or worsening symptoms.    Teretha Damme, PA-C [facility] [phone]  Clinic MD:   Serene

## 2024-03-06 ENCOUNTER — Other Ambulatory Visit: Payer: Self-pay | Admitting: *Deleted

## 2024-03-06 DIAGNOSIS — I739 Peripheral vascular disease, unspecified: Secondary | ICD-10-CM

## 2024-03-06 DIAGNOSIS — E1151 Type 2 diabetes mellitus with diabetic peripheral angiopathy without gangrene: Secondary | ICD-10-CM | POA: Diagnosis not present

## 2024-03-06 DIAGNOSIS — I70213 Atherosclerosis of native arteries of extremities with intermittent claudication, bilateral legs: Secondary | ICD-10-CM

## 2024-03-06 DIAGNOSIS — Z89422 Acquired absence of other left toe(s): Secondary | ICD-10-CM | POA: Diagnosis not present

## 2024-03-06 DIAGNOSIS — Z8631 Personal history of diabetic foot ulcer: Secondary | ICD-10-CM | POA: Diagnosis not present

## 2024-03-06 DIAGNOSIS — Z794 Long term (current) use of insulin: Secondary | ICD-10-CM | POA: Diagnosis not present

## 2024-03-08 ENCOUNTER — Other Ambulatory Visit: Payer: Self-pay | Admitting: Internal Medicine

## 2024-03-08 DIAGNOSIS — I10 Essential (primary) hypertension: Secondary | ICD-10-CM

## 2024-03-08 DIAGNOSIS — N182 Chronic kidney disease, stage 2 (mild): Secondary | ICD-10-CM

## 2024-03-10 NOTE — Telephone Encounter (Signed)
 Received pt portion pap Lilly Cares Basaglar  and Bicares Synjardy  from [facility] on provider portion.

## 2024-03-18 DIAGNOSIS — R0981 Nasal congestion: Secondary | ICD-10-CM | POA: Diagnosis not present

## 2024-03-18 DIAGNOSIS — R059 Cough, unspecified: Secondary | ICD-10-CM | POA: Diagnosis not present

## 2024-03-24 ENCOUNTER — Telehealth: Payer: Self-pay

## 2024-03-24 NOTE — Telephone Encounter (Signed)
 Copied from CRM #[id number]. Topic: Clinical - Medication Question >> Mar 24, 2024  1:36 PM Ahlexyia S wrote: Reason for CRM: Pt called in requesting patient assistance for prescriptions. Pt is requesting a callback for this. Informed pt of turnaround for callbacks.

## 2024-03-24 NOTE — Telephone Encounter (Signed)
 Have I spoken with you about this patient before? Could you please help or do I need to place the referral?

## 2024-03-28 NOTE — Telephone Encounter (Signed)
 Faxed provider portion [employer] and [phone].

## 2024-04-02 ENCOUNTER — Telehealth: Payer: Self-pay

## 2024-04-02 NOTE — Telephone Encounter (Signed)
-----   Message from Nurse Sherri P sent at 02/22/2024 10:55 AM EDT ----- Regarding: 05/08/2024  AFib/AFL ablation Precert:  MD: Camnitz Type of ablation: A-fib and A-flutter Diagnosis: tachycardia CPT code: A-fib (06343) + 93655 Ablation scheduled (date/time): 05/08/2024  7:30 am  Procedure:  Added to calendar? Yes Orders entered? Yes Letter complete? No, >30 days before procedure Scheduled with cath lab? Yes Any medications to hold? Yes (please list hold instructions): routine Labs ordered (CBC, BMET, PT/INR if on warfarin): Yes Mapping system: CARTO (lab 4 or 6) CARTO/OPAL rep notified? No Cardiac CT needed? Yes, ordered Dye allergy? No Pre-meds ordered and instructions given? N/a Letter method: MyChart H&P: 10/9 Device: No  Follow-up:  Cassie/Angel, please schedule Routine.

## 2024-04-03 ENCOUNTER — Telehealth: Payer: Self-pay

## 2024-04-03 DIAGNOSIS — E1159 Type 2 diabetes mellitus with other circulatory complications: Secondary | ICD-10-CM

## 2024-04-03 NOTE — Telephone Encounter (Signed)
 Patient was identified as falling into the [place] Measure - Diabetes.   Patient was: Appointment scheduled for lab or office visit for A1c.

## 2024-04-04 ENCOUNTER — Telehealth: Payer: Self-pay

## 2024-04-04 NOTE — Addendum Note (Signed)
 Addended by: CLEATUS SUZEN SAILOR on: 04/04/2024 10:16 AM   Modules accepted: Orders

## 2024-04-04 NOTE — Telephone Encounter (Signed)
 Please advise. It looks like Dr. Inocencio placed the lab orders.

## 2024-04-04 NOTE — Telephone Encounter (Signed)
 Copied from CRM [id number]. Topic: General - Other >> Apr 04, 2024  3:13 PM Alfonso HERO wrote: Reason for CRM: patient rescheduled her labs she is requesting to be tested for everything and not just her A1C.

## 2024-04-04 NOTE — Telephone Encounter (Signed)
 Followed up with pt to discuss.  Aware that the blood work we ordered back in October was for her upcoming procedure next month (BMET & CBC).  Aware that we do not draw PCP blood work needs (ex: A1C). Advised to discuss with their office as they be able to draw what they need and what we would need at the same time, otherwise pt will have to get done separately. Will forward back to PCP office to follow up and address with patient. Pt understands their office will contact her....  (Printing CT & Ablation instructions and leaving at front desk for pt to pick up next Tuesday)

## 2024-04-07 ENCOUNTER — Other Ambulatory Visit

## 2024-04-07 NOTE — Telephone Encounter (Signed)
 Received provider potion pap Lilly Cares Basaglar  and Bicares Synjardy . Missing proof of income from pt, spoke with pr will bring it tomorrow to provider office.

## 2024-04-07 NOTE — Telephone Encounter (Signed)
 Patient has been made aware and gave a verbal understanding. Patient will be in tomorrow to have the labs done.

## 2024-04-08 ENCOUNTER — Other Ambulatory Visit

## 2024-04-08 ENCOUNTER — Other Ambulatory Visit: Payer: Self-pay | Admitting: Internal Medicine

## 2024-04-08 DIAGNOSIS — E1159 Type 2 diabetes mellitus with other circulatory complications: Secondary | ICD-10-CM | POA: Diagnosis not present

## 2024-04-08 DIAGNOSIS — I484 Atypical atrial flutter: Secondary | ICD-10-CM | POA: Diagnosis not present

## 2024-04-08 DIAGNOSIS — Z794 Long term (current) use of insulin: Secondary | ICD-10-CM | POA: Diagnosis not present

## 2024-04-08 DIAGNOSIS — Z01812 Encounter for preprocedural laboratory examination: Secondary | ICD-10-CM | POA: Diagnosis not present

## 2024-04-08 DIAGNOSIS — E1151 Type 2 diabetes mellitus with diabetic peripheral angiopathy without gangrene: Secondary | ICD-10-CM

## 2024-04-09 ENCOUNTER — Ambulatory Visit: Payer: Self-pay | Admitting: Internal Medicine

## 2024-04-09 ENCOUNTER — Other Ambulatory Visit

## 2024-04-09 DIAGNOSIS — D509 Iron deficiency anemia, unspecified: Secondary | ICD-10-CM | POA: Diagnosis not present

## 2024-04-09 LAB — BASIC METABOLIC PANEL WITH GFR
BUN/Creatinine Ratio: 13 (ref 12–28)
BUN: 14 mg/dL (ref 8–27)
CO2: 24 mmol/L (ref 20–29)
Calcium: 9.6 mg/dL (ref 8.7–10.3)
Chloride: 101 mmol/L (ref 96–106)
Creatinine, Ser: 1.12 mg/dL — AB (ref 0.57–1.00)
Glucose: 214 mg/dL — AB (ref 70–99)
Potassium: 4.1 mmol/L (ref 3.5–5.2)
Sodium: 142 mmol/L (ref 134–144)
eGFR: 54 mL/min/1.73 — AB (ref >59)

## 2024-04-09 LAB — CBC
Hematocrit: 43.6 % (ref 34.0–46.6)
Hemoglobin: 14 g/dL (ref 11.1–15.9)
MCH: 28.6 pg (ref 26.6–33.0)
MCHC: 32.1 g/dL (ref 31.5–35.7)
MCV: 89 fL (ref 79–97)
Platelets: 250 x10E3/uL (ref 150–450)
RBC: 4.89 x10E6/uL (ref 3.77–5.28)
RDW: 16 % — ABNORMAL HIGH (ref 11.7–15.4)
WBC: 8.6 x10E3/uL (ref 3.4–10.8)

## 2024-04-09 LAB — IBC + FERRITIN
Ferritin: 34.1 ng/mL (ref 10.0–291.0)
Iron: 50 ug/dL (ref 42–145)
Saturation Ratios: 15.9 % — ABNORMAL LOW (ref 20.0–50.0)
TIBC: 315 ug/dL (ref 250.0–450.0)
Transferrin: 225 mg/dL (ref 212.0–360.0)

## 2024-04-09 LAB — HEMOGLOBIN A1C: Hgb A1c MFr Bld: 8.8 % — ABNORMAL HIGH (ref 4.6–6.5)

## 2024-04-09 NOTE — Telephone Encounter (Signed)
 Pt has been working with med access advocate and has been in contact with her. See telephone encounter 02/26/24.  Darrelyn Drum, PharmD, BCPS, CPP Clinical Pharmacist Practitioner [facility] at [facility] [phone]

## 2024-04-11 ENCOUNTER — Other Ambulatory Visit: Payer: Self-pay

## 2024-04-11 MED ORDER — ONETOUCH VERIO VI STRP: ORAL_STRIP | 3 refills | Status: AC

## 2024-04-12 ENCOUNTER — Other Ambulatory Visit: Payer: Self-pay | Admitting: Internal Medicine

## 2024-04-12 DIAGNOSIS — I4891 Unspecified atrial fibrillation: Secondary | ICD-10-CM

## 2024-04-15 NOTE — Telephone Encounter (Signed)
 Received proof of income from pt [employer]) and [employer] ([employer] ) faxed it to both company.

## 2024-04-16 ENCOUNTER — Telehealth (HOSPITAL_COMMUNITY): Payer: Self-pay

## 2024-04-16 ENCOUNTER — Encounter (HOSPITAL_COMMUNITY): Payer: Self-pay

## 2024-04-16 NOTE — Telephone Encounter (Signed)
 Spoke with patient to complete pre-procedure call.     Health status review:  Any new medical conditions, recent signs of acute illness or been started on antibiotics? No Any recent hospitalizations or surgeries? No Any new medications started since pre-op visit? No  Follow all medication instructions prior to procedure or the procedure may be rescheduled:    Continue taking Xarelto  (Rivaroxaban ) once daily without missing any doses before procedure. Essential chronic medications:  No medication should be continued, unless told otherwise.  HOLD: Semaglutide  (Ozempic , Rybelsus , Wegovy ) for 1 week prior to the procedure. Last dose on Sunday, December 28. HOLD: Empagliflozin /Metformin  (Synjardy ; Synjardy  XR) for 3 days prior to the procedure. Last dose on Sunday, January 04.   Humalog  (Insulin ): No bedtime dose the day before your procedure. The day of your procedure if your CBG is greater than 220 mg/dL, you may take 1/2 of your usual dose.  Basaglar  (Insulin ): The day before your procedure only take  of your usual dinner/bedtime dose.   On the morning of your procedure DO NOT take any medication., including Xarelto  (Rivaroxaban ).  Nothing to eat or drink after midnight prior to your procedure.  Pre-procedure testing scheduled: CT on December 18 and lab work completed.  Confirmed patient is scheduled for Atrial Fibrillation Ablation on Thursday, January 8 with Dr. Inocencio. Instructed patient to arrive at the Main Entrance A at [facility]: [address] [place], [place] [address] and check in at Admitting at 5:30 AM.  Plan to go home the same day, you will only stay overnight if medically necessary. You MUST have a responsible adult to drive you home and MUST be with you the first 24 hours after you arrive home or your procedure could be cancelled.  Informed a nurse may call a day before the procedure to confirm arrival time and ensure instructions are followed.  Patient verbalized  understanding to information provided and is agreeable to proceed with procedure.   Advised to contact RN Navigator at [phone], to inform of any new medications started after call or concerns prior to procedure.

## 2024-04-17 ENCOUNTER — Ambulatory Visit (HOSPITAL_COMMUNITY): Admission: RE | Admit: 2024-04-17 | Attending: Cardiology

## 2024-04-17 DIAGNOSIS — I4819 Other persistent atrial fibrillation: Secondary | ICD-10-CM | POA: Insufficient documentation

## 2024-04-17 DIAGNOSIS — I484 Atypical atrial flutter: Secondary | ICD-10-CM | POA: Diagnosis present

## 2024-04-17 MED ORDER — IOHEXOL 350 MG/ML SOLN
100.0000 mL | Freq: Once | INTRAVENOUS | Status: AC | PRN
  Administered 2024-04-17: 11:00:00 100 mL via INTRAVENOUS

## 2024-04-21 ENCOUNTER — Other Ambulatory Visit (HOSPITAL_COMMUNITY): Payer: Self-pay

## 2024-04-21 NOTE — Telephone Encounter (Signed)
 Received approval letter from [facility] [employer]  thru 04/30/2025, approval letter index.

## 2024-04-25 ENCOUNTER — Ambulatory Visit: Payer: Self-pay | Admitting: Cardiology

## 2024-05-05 ENCOUNTER — Other Ambulatory Visit: Payer: Self-pay | Admitting: Internal Medicine

## 2024-05-05 DIAGNOSIS — I4891 Unspecified atrial fibrillation: Secondary | ICD-10-CM

## 2024-05-06 ENCOUNTER — Telehealth: Payer: Self-pay | Admitting: Cardiology

## 2024-05-06 NOTE — Telephone Encounter (Signed)
" °  The patient is calling to inform Dr. Inocencio that she is currently taking antibiotics for her toe. She would like to confirm that there will be no changes to her upcoming ablation procedure. "

## 2024-05-06 NOTE — Telephone Encounter (Signed)
 Pt advised of MD response and ok to proceed with scheduled ablation in 2 days.

## 2024-05-07 ENCOUNTER — Ambulatory Visit: Admitting: Internal Medicine

## 2024-05-07 NOTE — H&P (Signed)
" °  Electrophysiology Office Note:   Date:  05/07/2024  ID:  Theresa Harrington, DOB 12-10-1955, MRN [id number]  Primary Cardiologist: Oneil Parchment, MD Primary Heart Failure: None Electrophysiologist: Merian Wroe Gladis Norton, MD      History of Present Illness:   Theresa Harrington is a 69 y.o. female with h/o PAD post femoropopliteal bypass in 2016, atrial fibrillation, diabetes, coronary artery disease, hypertension seen today for routine electrophysiology followup.   Follow-up in A-fib clinic 10/23/2023 showed atrial flutter.  She is post cardioversion 01/24/2024.  She is post atrial fibrillation ablation 08/02/2021.  Today, denies symptoms of palpitations, chest pain, dyspnea, orthopnea, PND, lower extremity edema, claudication, dizziness, presyncope, syncope, bleeding, or neurologic sequela. The patient is tolerating medications without difficulties. Plan AF ablation today.   EP Information / Studies Reviewed:    EKG is ordered today. Personal review as below.      Risk Assessment/Calculations:    CHA2DS2-VASc Score = 4   This indicates a 4.8% annual risk of stroke. The patient's score is based upon: CHF History: 0 HTN History: 1 Diabetes History: 0 Stroke History: 0 Vascular Disease History: 1 Age Score: 1 Gender Score: 1            Physical Exam:   VS:  LMP  (LMP Unknown)    Wt Readings from Last 3 Encounters:  03/03/24 76.7 kg  02/07/24 77.6 kg  01/16/24 79.4 kg    GEN: Well nourished, well developed in no acute distress NECK: No JVD; No carotid bruits CARDIAC: Regular rate and rhythm, no murmurs, rubs, gallops RESPIRATORY:  Clear to auscultation without rales, wheezing or rhonchi  ABDOMEN: Soft, non-tender, non-distended EXTREMITIES:  No edema; No deformity    ASSESSMENT AND PLAN:    1.  Persistent atrial fibrillation/flutter: Theresa Harrington has presented today for surgery, with the diagnosis of AF.  The various methods of treatment have been discussed with the patient and family. After  consideration of risks, benefits and other options for treatment, the patient has consented to  Procedure(s): Catheter ablation as a surgical intervention .  Risks include but not limited to complete heart block, stroke, esophageal damage, nerve damage, bleeding, vascular damage, tamponade, perforation, MI, and death. The patient's history has been reviewed, patient examined, no change in status, stable for surgery.  I have reviewed the patient's chart and labs.  Questions were answered to the patient's satisfaction.    Theresa [place] Norton, MD 05/07/2024 4:58 PM  "

## 2024-05-07 NOTE — Pre-Procedure Instructions (Signed)
 Instructed patient on the following items: Arrival time 0515 Nothing to eat or drink after midnight No meds AM of procedure Responsible person to drive you home and stay with you for 24 hrs  Have you missed any doses of anti-coagulant Xarelto - takes once a day, hasn't missed any doses in last 4 weeks.   Xarelto - last dose 12/28

## 2024-05-08 ENCOUNTER — Ambulatory Visit (HOSPITAL_COMMUNITY): Payer: Self-pay | Admitting: Registered Nurse

## 2024-05-08 ENCOUNTER — Other Ambulatory Visit: Payer: Self-pay

## 2024-05-08 ENCOUNTER — Ambulatory Visit (HOSPITAL_COMMUNITY)
Admission: RE | Disposition: A | Discharge status: Home / Self Care | Payer: Self-pay | Source: Home / Self Care | Attending: Cardiology

## 2024-05-08 ENCOUNTER — Ambulatory Visit (HOSPITAL_COMMUNITY)
Admission: RE | Admit: 2024-05-08 | Discharge: 2024-05-08 | Disposition: A | Discharge status: Home / Self Care | Attending: Cardiology | Admitting: Cardiology

## 2024-05-08 DIAGNOSIS — I483 Typical atrial flutter: Secondary | ICD-10-CM | POA: Diagnosis not present

## 2024-05-08 DIAGNOSIS — Z87891 Personal history of nicotine dependence: Secondary | ICD-10-CM | POA: Insufficient documentation

## 2024-05-08 DIAGNOSIS — E119 Type 2 diabetes mellitus without complications: Secondary | ICD-10-CM

## 2024-05-08 DIAGNOSIS — I2510 Atherosclerotic heart disease of native coronary artery without angina pectoris: Secondary | ICD-10-CM | POA: Diagnosis not present

## 2024-05-08 DIAGNOSIS — I4891 Unspecified atrial fibrillation: Secondary | ICD-10-CM

## 2024-05-08 DIAGNOSIS — F418 Other specified anxiety disorders: Secondary | ICD-10-CM

## 2024-05-08 DIAGNOSIS — E1151 Type 2 diabetes mellitus with diabetic peripheral angiopathy without gangrene: Secondary | ICD-10-CM | POA: Diagnosis not present

## 2024-05-08 DIAGNOSIS — Z79899 Other long term (current) drug therapy: Secondary | ICD-10-CM | POA: Insufficient documentation

## 2024-05-08 DIAGNOSIS — I4819 Other persistent atrial fibrillation: Secondary | ICD-10-CM | POA: Diagnosis not present

## 2024-05-08 DIAGNOSIS — I10 Essential (primary) hypertension: Secondary | ICD-10-CM | POA: Diagnosis not present

## 2024-05-08 DIAGNOSIS — E039 Hypothyroidism, unspecified: Secondary | ICD-10-CM | POA: Diagnosis not present

## 2024-05-08 DIAGNOSIS — Z794 Long term (current) use of insulin: Secondary | ICD-10-CM | POA: Diagnosis not present

## 2024-05-08 DIAGNOSIS — K219 Gastro-esophageal reflux disease without esophagitis: Secondary | ICD-10-CM | POA: Insufficient documentation

## 2024-05-08 HISTORY — PX: A-FLUTTER ABLATION: EP1230

## 2024-05-08 HISTORY — PX: ATRIAL FIBRILLATION ABLATION: EP1191

## 2024-05-08 LAB — POCT ACTIVATED CLOTTING TIME
Activated Clotting Time: 301 s
Activated Clotting Time: 368 s

## 2024-05-08 LAB — GLUCOSE, CAPILLARY
Glucose-Capillary: 181 mg/dL — ABNORMAL HIGH (ref 70–99)
Glucose-Capillary: 198 mg/dL — ABNORMAL HIGH (ref 70–99)

## 2024-05-08 MED ORDER — SUGAMMADEX SODIUM 200 MG/2ML IV SOLN
INTRAVENOUS | Status: DC | PRN
  Administered 2024-05-08: 300 mg via INTRAVENOUS

## 2024-05-08 MED ORDER — ATROPINE SULFATE 1 MG/10ML IJ SOSY
PREFILLED_SYRINGE | INTRAMUSCULAR | Status: AC
  Filled 2024-05-08: qty 10

## 2024-05-08 MED ORDER — HEPARIN SODIUM (PORCINE) 1000 UNIT/ML IJ SOLN
INTRAMUSCULAR | Status: AC
  Filled 2024-05-08: qty 10

## 2024-05-08 MED ORDER — SODIUM CHLORIDE 0.9 % IV SOLN: 250.0000 mL | INTRAVENOUS | Status: DC | PRN

## 2024-05-08 MED ORDER — PROTAMINE SULFATE 10 MG/ML IV SOLN
INTRAVENOUS | Status: DC | PRN
  Administered 2024-05-08: 40 mg via INTRAVENOUS

## 2024-05-08 MED ORDER — FENTANYL CITRATE (PF) 100 MCG/2ML IJ SOLN
INTRAMUSCULAR | Status: AC
  Filled 2024-05-08: qty 2

## 2024-05-08 MED ORDER — ONDANSETRON HCL 4 MG/2ML IJ SOLN: 4.0000 mg | Freq: Four times a day (QID) | INTRAMUSCULAR | Status: DC | PRN

## 2024-05-08 MED ORDER — LIDOCAINE 2% (20 MG/ML) 5 ML SYRINGE
INTRAMUSCULAR | Status: DC | PRN
  Administered 2024-05-08: 60 mg via INTRAVENOUS

## 2024-05-08 MED ORDER — SODIUM CHLORIDE 0.9% FLUSH: 3.0000 mL | Freq: Two times a day (BID) | INTRAVENOUS | Status: DC

## 2024-05-08 MED ORDER — ROCURONIUM BROMIDE 10 MG/ML (PF) SYRINGE
PREFILLED_SYRINGE | INTRAVENOUS | Status: DC | PRN
  Administered 2024-05-08: 60 mg via INTRAVENOUS
  Administered 2024-05-08: 10 mg via INTRAVENOUS

## 2024-05-08 MED ORDER — SODIUM CHLORIDE 0.9 % IV SOLN: INTRAVENOUS | Status: DC

## 2024-05-08 MED ORDER — SODIUM CHLORIDE 0.9% FLUSH: 3.0000 mL | INTRAVENOUS | Status: DC | PRN

## 2024-05-08 MED ORDER — ONDANSETRON HCL 4 MG/2ML IJ SOLN
INTRAMUSCULAR | Status: DC | PRN
  Administered 2024-05-08: 4 mg via INTRAVENOUS

## 2024-05-08 MED ORDER — DEXAMETHASONE SOD PHOSPHATE PF 10 MG/ML IJ SOLN
INTRAMUSCULAR | Status: DC | PRN
  Administered 2024-05-08: 5 mg via INTRAVENOUS

## 2024-05-08 MED ORDER — HEPARIN (PORCINE) IN NACL 1000-0.9 UT/500ML-% IV SOLN
INTRAVENOUS | Status: DC | PRN
  Administered 2024-05-08 (×2): 500 mL

## 2024-05-08 MED ORDER — HEPARIN SODIUM (PORCINE) 1000 UNIT/ML IJ SOLN
INTRAMUSCULAR | Status: AC
  Filled 2024-05-08: qty 20

## 2024-05-08 MED ORDER — ACETAMINOPHEN 325 MG PO TABS: 650.0000 mg | ORAL_TABLET | ORAL | Status: DC | PRN

## 2024-05-08 MED ORDER — ATROPINE SULFATE 1 MG/ML IV SOLN
INTRAVENOUS | Status: DC | PRN
  Administered 2024-05-08: 1 mg via INTRAVENOUS

## 2024-05-08 MED ORDER — PROPOFOL 10 MG/ML IV BOLUS
INTRAVENOUS | Status: DC | PRN
  Administered 2024-05-08: 125 ug/kg/min via INTRAVENOUS
  Administered 2024-05-08: 150 mg via INTRAVENOUS

## 2024-05-08 MED ORDER — HEPARIN SODIUM (PORCINE) 1000 UNIT/ML IJ SOLN
INTRAMUSCULAR | Status: DC | PRN
  Administered 2024-05-08: 5000 [IU] via INTRAVENOUS
  Administered 2024-05-08: 14000 [IU] via INTRAVENOUS

## 2024-05-08 MED ORDER — FENTANYL CITRATE (PF) 250 MCG/5ML IJ SOLN
INTRAMUSCULAR | Status: DC | PRN
  Administered 2024-05-08 (×2): 50 ug via INTRAVENOUS

## 2024-05-08 MED ORDER — HEPARIN SODIUM (PORCINE) 1000 UNIT/ML IJ SOLN
INTRAMUSCULAR | Status: DC | PRN
  Administered 2024-05-08: 1000 [IU] via INTRAVENOUS

## 2024-05-08 NOTE — Anesthesia Procedure Notes (Signed)
 Procedure Name: Intubation Date/Time: 05/08/2024 7:41 AM  Performed by: Christopher Comings, CRNAPre-anesthesia Checklist: Patient identified, Emergency Drugs available, Suction available and Patient being monitored Patient Re-evaluated:Patient Re-evaluated prior to induction Oxygen  Delivery Method: Circle system utilized Preoxygenation: Pre-oxygenation with 100% oxygen  Induction Type: IV induction Ventilation: Mask ventilation without difficulty Laryngoscope Size: Mac and 4 Grade View: Grade I Tube type: Oral Tube size: 7.0 mm Number of attempts: 1 Airway Equipment and Method: Stylet and Oral airway Placement Confirmation: ETT inserted through vocal cords under direct vision, positive ETCO2 and breath sounds checked- equal and bilateral Secured at: 21 cm Tube secured with: Tape Dental Injury: Teeth and Oropharynx as per pre-operative assessment

## 2024-05-08 NOTE — Discharge Instructions (Signed)

## 2024-05-08 NOTE — Progress Notes (Signed)
 Up and walked to bathroom and had bleeding noted left groin and back to bed x 

## 2024-05-08 NOTE — Progress Notes (Signed)
 Patient ambulated in the hallway and to the bathroom. Bilateral groin sites are clean, dry, and intact. No bleeding or hematoma noted to the sites.

## 2024-05-08 NOTE — Anesthesia Postprocedure Evaluation (Signed)
"   Anesthesia Post Note  Patient: Theresa Harrington  Procedure(s) Performed: ATRIAL FIBRILLATION ABLATION A-FLUTTER ABLATION     Patient location during evaluation: PACU Anesthesia Type: General Level of consciousness: awake and alert Pain management: pain level controlled Vital Signs Assessment: post-procedure vital signs reviewed and stable Respiratory status: spontaneous breathing, nonlabored ventilation, respiratory function stable and patient connected to nasal cannula oxygen  Cardiovascular status: blood pressure returned to baseline and stable Postop Assessment: no apparent nausea or vomiting Anesthetic complications: no   There were no known notable events for this encounter.  Last Vitals:  Vitals:   05/08/24 1030 05/08/24 1100  BP: (!) 157/61 (!) 162/64  Pulse: (!) 53 (!) 54  Resp: 12 12  Temp:    SpO2: 94% 92%    Last Pain:  Vitals:   05/08/24 0855  TempSrc: Oral  PainSc: 0-No pain                 Cordella SQUIBB Nikki Glanzer      "

## 2024-05-08 NOTE — Transfer of Care (Signed)
 Immediate Anesthesia Transfer of Care Note  Patient: Theresa Harrington  Procedure(s) Performed: ATRIAL FIBRILLATION ABLATION A-FLUTTER ABLATION  Patient Location: Cath Lab  Anesthesia Type:General  Level of Consciousness: awake, alert , and oriented  Airway & Oxygen  Therapy: Patient Spontanous Breathing and Patient connected to nasal cannula oxygen   Post-op Assessment: Report given to RN and Post -op Vital signs reviewed and stable  Post vital signs: Reviewed and stable  Last Vitals:  Vitals Value Taken Time  BP    Temp    Pulse 53 05/08/24 08:57  Resp 11 05/08/24 08:57  SpO2 95 % 05/08/24 08:57  Vitals shown include unfiled device data.  Last Pain:  Vitals:   05/08/24 0559  TempSrc: Oral  PainSc: 0-No pain         Complications: There were no known notable events for this encounter.

## 2024-05-08 NOTE — Anesthesia Preprocedure Evaluation (Addendum)
 "                                  Anesthesia Evaluation  Patient identified by MRN, date of birth, ID band Patient awake    Reviewed: Allergy & Precautions, NPO status , Patient's Chart, lab work & pertinent test results  History of Anesthesia Complications (+) PONV and history of anesthetic complications  Airway Mallampati: II  TM Distance: >3 FB Neck ROM: Full    Dental no notable dental hx.    Pulmonary former smoker   Pulmonary exam normal        Cardiovascular hypertension, Pt. on medications + Peripheral Vascular Disease  + dysrhythmias Atrial Fibrillation  Rhythm:Irregular Rate:Normal     Neuro/Psych   Anxiety Depression    negative neurological ROS     GI/Hepatic Neg liver ROS,GERD  Medicated,,  Endo/Other  diabetes, Type 2, Insulin  DependentHypothyroidism    Renal/GU Renal disease  negative genitourinary   Musculoskeletal  (+) Arthritis ,    Abdominal Normal abdominal exam  (+)   Peds  Hematology  (+) Blood dyscrasia, anemia Lab Results      Component                Value               Date                      WBC                      8.6                 04/08/2024                HGB                      14.0                04/08/2024                HCT                      43.6                04/08/2024                MCV                      89                  04/08/2024                PLT                      250                 04/08/2024             Lab Results      Component                Value               Date                      NA  142                 04/08/2024                K                        4.1                 04/08/2024                CO2                      24                  04/08/2024                GLUCOSE                  214 (H)             04/08/2024                BUN                      14                  04/08/2024                CREATININE               1.12 (H)             04/08/2024                CALCIUM                   9.6                 04/08/2024                GFR                      73.36               10/08/2023                EGFR                     54 (L)              04/08/2024                GFRNONAA                 >60                 04/12/2023              Anesthesia Other Findings   Reproductive/Obstetrics                              Anesthesia Physical Anesthesia Plan  ASA: 3  Anesthesia Plan: General   Post-op Pain Management:    Induction: Intravenous  PONV Risk Score and Plan: 4 or greater and Ondansetron , Dexamethasone , Midazolam  and Treatment may vary due to age or medical condition  Airway Management Planned: Mask and Oral ETT  Additional Equipment: None  Intra-op Plan:   Post-operative Plan: Extubation in OR  Informed Consent: I have reviewed the patients History and Physical, chart, labs and discussed the procedure including the risks, benefits and alternatives for the proposed anesthesia with the patient or authorized representative who has indicated his/her understanding and acceptance.     Dental advisory given  Plan Discussed with: CRNA  Anesthesia Plan Comments:          Anesthesia Quick Evaluation  "

## 2024-05-09 ENCOUNTER — Other Ambulatory Visit: Payer: Self-pay | Admitting: Internal Medicine

## 2024-05-09 ENCOUNTER — Encounter (HOSPITAL_COMMUNITY): Payer: Self-pay | Admitting: Cardiology

## 2024-05-09 ENCOUNTER — Telehealth (HOSPITAL_COMMUNITY): Payer: Self-pay

## 2024-05-09 DIAGNOSIS — I4892 Unspecified atrial flutter: Secondary | ICD-10-CM

## 2024-05-09 MED FILL — Fentanyl Citrate Preservative Free (PF) Inj 100 MCG/2ML: INTRAMUSCULAR | Qty: 2 | Status: AC

## 2024-05-09 MED FILL — Atropine Sulfate Soln Prefill Syr 1 MG/10ML (0.1 MG/ML): INTRAMUSCULAR | Qty: 10 | Status: AC

## 2024-05-09 NOTE — Telephone Encounter (Signed)
 Spoke with patient to complete post procedure follow up call.  Patient reports no complications with groin sites.   Instructions reviewed with patient:  Remove large bandage at puncture site after 24 hours. It is normal to have bruising, tenderness, mild swelling, and a pea or marble sized lump/knot at the groin site which can take up to three months to resolve.  Get help right away if you notice sudden swelling at the puncture site.  Check your puncture site every day for signs of infection: fever, redness, swelling, pus drainage, warmth, foul odor or excessive pain. If this occurs, please call [phone], to speak with the RN Navigator. Get help right away if your puncture site is bleeding and the bleeding does not stop after applying firm pressure to the area.  You may continue to have skipped beats/ atrial fibrillation/flutter during the first several months after your procedure.  It is very important not to miss any doses of your blood thinner Xarelto .    You will follow up with the Afib clinic 4 weeks after your procedure and follow up with the Afib clinic 3 months after your procedure.  Activity restrictions reviewed.  Patient verbalized understanding to all instructions provided.

## 2024-05-11 ENCOUNTER — Other Ambulatory Visit: Payer: Self-pay | Admitting: Internal Medicine

## 2024-05-11 DIAGNOSIS — K2100 Gastro-esophageal reflux disease with esophagitis, without bleeding: Secondary | ICD-10-CM

## 2024-05-11 DIAGNOSIS — K5904 Chronic idiopathic constipation: Secondary | ICD-10-CM

## 2024-05-12 ENCOUNTER — Ambulatory Visit: Attending: Surgery | Admitting: Surgery

## 2024-05-12 ENCOUNTER — Ambulatory Visit: Payer: Self-pay

## 2024-05-12 ENCOUNTER — Telehealth: Payer: Self-pay | Admitting: Cardiology

## 2024-05-12 ENCOUNTER — Encounter: Payer: Self-pay | Admitting: Surgery

## 2024-05-12 VITALS — BP 207/76 | HR 55 | Temp 98.1°F | Resp 20 | Ht 64.0 in | Wt 173.1 lb

## 2024-05-12 DIAGNOSIS — I70238 Atherosclerosis of native arteries of right leg with ulceration of other part of lower right leg: Secondary | ICD-10-CM | POA: Diagnosis not present

## 2024-05-12 MED ORDER — DOXYCYCLINE HYCLATE 100 MG PO CAPS: 100.0000 mg | ORAL_CAPSULE | Freq: Two times a day (BID) | ORAL | 0 refills | Status: DC

## 2024-05-12 NOTE — Telephone Encounter (Signed)
 Spoke to pt, made aware of MD recommendation.  Advised to keep appt with PCP tomorrow. Advised when to go to ED, if needed, prior to tomorrow. She reports BP continues to improve after taking her Diltiazem  (she did not take it prior to her appt this morning with vascular)

## 2024-05-12 NOTE — Progress Notes (Signed)
 "                                    Vascular and Vein Specialist of [facility]  Patient name: Theresa Harrington MRN: [id number] DOB: [birth year] Sex: female   REASON FOR VISIT:    Follow up  HISOTRY OF PRESENT ILLNESS:    Theresa Harrington is a 69 y.o. female who is back for follow-up.  She has undergone the following procedures:  06/08/2014: Left iliofemoral endarterectomy and femoral to below-knee popliteal bypass graft with vein (Dr. Oris ) 06/09/2024: Left femoral below-knee popliteal bypass graft with propatent, endarterectomy left anterior tibial artery (Lasya Vetter) 07/16/2014: Left second toe amputation (Jolayne Branson) 03/09/2020: Diagnostic angiography 03/06/2023: Diagnostic angiography 03/09/2023: Right iliofemoral endarterectomy with femoral posterior tibial bypass graft, composite (Aric Jost) 12/25/2023: Diagnostic angiography   Her Bilateral bypass grafts are noted to be occluded.  She does have some claudication but this is not lifestyle limiting.  She develops blisters on her toes on the right several weeks ago.  She has been on it doxycycline  and using Santyl.  she continues to struggle with control of her diabetes.   She remains on statin therapy.  She is anticoagulated for atrial fibrillation.  She also recently underwent A-fib ablation     PAST MEDICAL HISTORY:   Past Medical History:  Diagnosis Date   Allergy    Anxiety    Pt. denies   Arthritis    right index figer   Asymptomatic cholelithiasis    Atherosclerosis of aorta    Atrial flutter, paroxysmal (HCC) 02/20/2023   Cataract    Clotting disorder    Gallstones 06/2013   GERD (gastroesophageal reflux disease)    Glaucoma    A LITTLE BIT   History of kidney stones    lithotrispy   Hyperlipidemia    Hypertension    Hypothyroidism    no meds   Iron  deficiency anemia    PAD (peripheral artery disease)    PAF (paroxysmal atrial fibrillation) (HCC)    Peripheral arterial occlusive disease    lower extremities   Pneumonia     PONV (postoperative nausea and vomiting)    Right ureteral stone    Type 2 diabetes mellitus (HCC)    Type   Vitamin B 12 deficiency    Wears glasses      FAMILY HISTORY:   Family History  Problem Relation Age of Onset   Kidney disease Mother    Hyperlipidemia Mother    Other Mother        AAA   and    Amputation   Diabetes Father    Heart disease Father    Deep vein thrombosis Father    Hyperlipidemia Father    Diabetes Sister    Heart disease Sister    Deep vein thrombosis Sister    Hyperlipidemia Sister    Diabetes Brother    Heart disease Brother    Cancer Brother    Hyperlipidemia Brother    Diabetes Brother    Diabetes Brother    Colon polyps Brother    Colon cancer Maternal Aunt    Arthritis Other    Cancer Other        colon   Hypertension Other    Stroke Other    Esophageal cancer Neg Hx    Stomach cancer Neg Hx    Inflammatory bowel disease Neg Hx    Liver disease Neg Hx  Pancreatic cancer Neg Hx    Crohn's disease Neg Hx    Rectal cancer Neg Hx     SOCIAL HISTORY:   Social History   Tobacco Use   Smoking status: Former    Current packs/day: 0.00    Average packs/day: 1 pack/day for 28.0 years (28.0 ttl pk-yrs)    Types: Cigarettes    Start date: 09/05/1973    Quit date: 09/05/2001    Years since quitting: 22.6    Passive exposure: Never   Smokeless tobacco: Never   Tobacco comments:    Former smoker 01/16/24  Substance Use Topics   Alcohol use: No    Alcohol/week: 0.0 standard drinks of alcohol     ALLERGIES:   Allergies[1]   CURRENT MEDICATIONS:   Current Outpatient Medications  Medication Sig Dispense Refill   amiodarone  (PACERONE ) 200 MG tablet Take 1 tablet by mouth twice daily for 30 days (Patient taking differently: Take 200 mg by mouth daily.) 60 tablet 6   aspirin  EC 325 MG tablet Take 325 mg by mouth daily.     Blood Glucose Monitoring Suppl (ONETOUCH VERIO IQ SYSTEM) w/Device KIT USE TO CHECK SUGAR DAILY 1 kit 0    Continuous Glucose Receiver (FREESTYLE LIBRE 3 READER) DEVI Use to check blood glucose levels with Freestyle Libre 3 Plus sensors. 1 each 0   Continuous Glucose Sensor (FREESTYLE LIBRE 3 PLUS SENSOR) MISC Change sensor every 15 days. 2 each 5   diltiazem  (CARDIZEM  CD) 120 MG 24 hr capsule Take 1 capsule by mouth once daily 90 capsule 0   diltiazem  (CARDIZEM ) 30 MG tablet Take 30 mg by mouth every 4 (four) hours as needed (a-fib).     dorzolamide -timolol  (COSOPT ) 22.3-6.8 MG/ML ophthalmic solution Place 1 drop into the right eye 2 (two) times daily.      doxycycline  (VIBRAMYCIN ) 100 MG capsule Take 1 capsule (100 mg total) by mouth 2 (two) times daily. 28 capsule 0   ezetimibe  (ZETIA ) 10 MG tablet Take 1 tablet by mouth once daily 90 tablet 0   glucose blood (ONETOUCH VERIO) test strip Use to check blood sugar 3x per day 300 strip 3   hydrALAZINE  (APRESOLINE ) 50 MG tablet Take 50 mg by mouth in the morning and at bedtime.     Insulin  Glargine (BASAGLAR  KWIKPEN) 100 UNIT/ML INJECT 50 UNITS UNDER THE SKIN DAILY (Patient taking differently: Inject 50 Units into the skin at bedtime.) 30 mL 5   insulin  lispro (HUMALOG  KWIKPEN) 100 UNIT/ML KwikPen Inject 14 Units into the skin with breakfast, with lunch, and with evening meal. DIAL  14 UNITS AND INJECT UNDER THE SKIN THREE TIMES A DAY. 45 mL 0   Insulin  Pen Needle (PEN NEEDLES) 32G X 4 MM MISC Inject 1 pen  as directed 4 (four) times daily. Use to inject levemir  or novolog  as directed. 400 each 3   latanoprost  (XALATAN ) 0.005 % ophthalmic solution Place 1 drop into both eyes at bedtime.      MAGNESIUM -OXIDE 400 (240 Mg) MG tablet Take 1 tablet by mouth once daily 90 tablet 0   nebivolol  (BYSTOLIC ) 10 MG tablet Take 1 tablet by mouth once daily 90 tablet 3   pantoprazole  (PROTONIX ) 40 MG tablet Take 1 tablet by mouth twice daily (Patient taking differently: Take 40 mg by mouth daily.) 180 tablet 0   rosuvastatin  (CRESTOR ) 5 MG tablet Take 1 tablet by mouth  once daily 90 tablet 3   Semaglutide , 1 MG/DOSE, (OZEMPIC , 1 MG/DOSE,) 4 MG/3ML SOPN  Inject 1 mg into the skin once a week. Via [employer] patient assistance 9 mL 1   SYNJARDY  5-500 MG TABS TAKE 1 TABLET BY MOUTH TWICE DAILY 120 tablet 1   torsemide  (DEMADEX ) 20 MG tablet Take 1 tablet by mouth once daily 90 tablet 0   XARELTO  20 MG TABS tablet Take 1 tablet by mouth once daily 90 tablet 0   No current facility-administered medications for this visit.    REVIEW OF SYSTEMS:   [X]  denotes positive finding, [ ]  denotes negative finding Cardiac  Comments:  Chest pain or chest pressure:    Shortness of breath upon exertion:    Short of breath when lying flat:    Irregular heart rhythm:        Vascular    Pain in calf, thigh, or hip brought on by ambulation: x   Pain in feet at night that wakes you up from your sleep:     Blood clot in your veins:    Leg swelling:         Pulmonary    Oxygen  at home:    Productive cough:     Wheezing:         Neurologic    Sudden weakness in arms or legs:     Sudden numbness in arms or legs:     Sudden onset of difficulty speaking or slurred speech:    Temporary loss of vision in one eye:     Problems with dizziness:         Gastrointestinal    Blood in stool:     Vomited blood:         Genitourinary    Burning when urinating:     Blood in urine:        Psychiatric    Major depression:         Hematologic    Bleeding problems:    Problems with blood clotting too easily:        Skin    Rashes or ulcers:        Constitutional    Fever or chills:      PHYSICAL EXAM:   Vitals:   05/12/24 0825  BP: (!) 207/76  Pulse: (!) 55  Resp: 20  Temp: 98.1 F (36.7 C)  TempSrc: Temporal  SpO2: 98%  Weight: 173 lb 1.6 oz (78.5 kg)  Height: 5' 4 (1.626 m)    GENERAL: The patient is a well-nourished female, in no acute distress. The vital signs are documented above. CARDIAC: There is a regular rate and rhythm.  VASCULAR: Biphasic  posterior tibial Doppler signal PULMONARY: Non-labored respirations NEUROLOGIC: No focal weakness or paresthesias are detected. PSYCHIATRIC: The patient has a normal affect.  STUDIES:     MEDICAL ISSUES:   Atherosclerotic vascular disease with ulcer, right leg: Her bypass graft on the right is known to be occluded.  She does reconstitute an anterior tibial artery proximally however this terminates just beyond the ankle.  The posterior tibial reconstitutes distally and crosses the ankle.  Because she does not have great surgical options, I would like to see if this heals on its own.  I recommended continuing her antibiotics and wound care.  We will continue to see her on a weekly basis until this starts making improvement.  If it does not, she will need repeat angiography where I would consider an attempt at percutaneous revascularization versus redo bypass graft which would have to be with prosthetic.  I  refilled her doxycycline  today.  She is gena come back to [facility] clinic next week for wound check.  I would like to see her next week when she is here    Malvina New, IV, MD, FACS [facility] [phone] Pager [phone]     [1]  Allergies Allergen Reactions   Amlodipine  Swelling   Atorvastatin  Other (See Comments)    Muscle aches    Ampicillin  Nausea And Vomiting and Other (See Comments)   Codeine Nausea And Vomiting    Ask patient   Lisinopril Cough        Penicillins Nausea And Vomiting    Did it involve swelling of the face/tongue/throat, SOB, or low BP? No Did it involve sudden or severe rash/hives, skin peeling, or any reaction on the inside of your mouth or nose? No Did you need to seek medical attention at a hospital or doctor's office? No When did it last happen?      20+ years If all above answers are NO, may proceed with cephalosporin use.    Januvia  [Sitagliptin ] Nausea And Vomiting   "

## 2024-05-12 NOTE — Telephone Encounter (Signed)
 Patient has been scheduled to see Dr. Joshua to follow up

## 2024-05-12 NOTE — Telephone Encounter (Signed)
 FYI Only or Action Required?: Action required by provider: request for appointment.  Patient was last seen in primary care on 12/13/2023 by Joshua Debby CROME, MD.  Called Nurse Triage reporting Hypertension.  Symptoms began today.  Interventions attempted: Nothing.  Symptoms are: stable. BP today in another office 204/79. No availability with PCP until February. Asking to be worked in.   Triage Disposition: See HCP Within 4 Hours (Or PCP Triage)  Patient/caregiver understands and will follow disposition?: Yes     Copied from CRM [id number]. Topic: Clinical - Red Word Triage >> May 12, 2024 10:38 AM Alfonso ORN wrote: Red Word that prompted transfer to Nurse Triage: bp and sugar levels high. Reading was 204/79 Reason for Disposition  [1] Systolic BP >= 200 OR Diastolic >= 120 AND [2] having NO cardiac or neurologic symptoms  Answer Assessment - Initial Assessment Questions 1. BLOOD PRESSURE: What is your blood pressure? Did you take at least two measurements 5 minutes apart?     204/79 2. ONSET: When did you take your blood pressure?     Today 3. HOW: How did you take your blood pressure? (e.g., automatic home BP monitor, visiting nurse)     Doctor's office 4. HISTORY: Do you have a history of high blood pressure?     yes 5. MEDICINES: Are you taking any medicines for blood pressure? Have you missed any doses recently?     no 6. OTHER SYMPTOMS: Do you have any symptoms? (e.g., blurred vision, chest pain, difficulty breathing, headache, weakness)     none 7. PREGNANCY: Is there any chance you are pregnant? When was your last menstrual period?     no  Protocols used: Blood Pressure - High-A-AH

## 2024-05-12 NOTE — Telephone Encounter (Signed)
 Pt c/o BP issue: STAT if pt c/o blurred vision, one-sided weakness or slurred speech.  STAT if BP is GREATER than 180/120 TODAY.  STAT if BP is LESS than 90/60 and SYMPTOMATIC TODAY  1. What is your BP concern?   Caller stated patient's BP has been trending high  2. Have you taken any BP medication today?   Yes  3. What are your last 5 BP readings?  207/76 - today in PCP office 162/64 157/61  4. Are you having any other symptoms (ex. Dizziness, headache, blurred vision, passed out)?   No   Caller Jerlene) stated patient had an OV today and her bp today was 207/76.

## 2024-05-12 NOTE — Telephone Encounter (Signed)
 Received call from CMA at PCP. Ablation done 05/08/24. BP post-ablation 162/64 Vascular surgeon seen today 207/76 Pt scheduled to see PCP tomorrow. Pt at work today and couldn't come in until tomorrow. PCP office calling to see if Dr Inocencio wants to see this patient or further advisement. Asking to speak to [facility] directly or Dr Inocencio. Will send to EP triage, Dr Inocencio and Maeola, RN.

## 2024-05-13 ENCOUNTER — Encounter: Payer: Self-pay | Admitting: Internal Medicine

## 2024-05-13 ENCOUNTER — Ambulatory Visit: Admitting: Internal Medicine

## 2024-05-13 ENCOUNTER — Telehealth: Payer: Self-pay

## 2024-05-13 VITALS — BP 142/72 | HR 55 | Temp 97.7°F | Resp 16 | Ht 64.0 in | Wt 171.2 lb

## 2024-05-13 DIAGNOSIS — E785 Hyperlipidemia, unspecified: Secondary | ICD-10-CM | POA: Diagnosis not present

## 2024-05-13 DIAGNOSIS — E1151 Type 2 diabetes mellitus with diabetic peripheral angiopathy without gangrene: Secondary | ICD-10-CM | POA: Diagnosis not present

## 2024-05-13 DIAGNOSIS — Z0000 Encounter for general adult medical examination without abnormal findings: Secondary | ICD-10-CM

## 2024-05-13 DIAGNOSIS — I10 Essential (primary) hypertension: Secondary | ICD-10-CM

## 2024-05-13 DIAGNOSIS — I739 Peripheral vascular disease, unspecified: Secondary | ICD-10-CM | POA: Diagnosis not present

## 2024-05-13 DIAGNOSIS — E119 Type 2 diabetes mellitus without complications: Secondary | ICD-10-CM

## 2024-05-13 DIAGNOSIS — Z1231 Encounter for screening mammogram for malignant neoplasm of breast: Secondary | ICD-10-CM

## 2024-05-13 DIAGNOSIS — Z794 Long term (current) use of insulin: Secondary | ICD-10-CM

## 2024-05-13 DIAGNOSIS — Z0001 Encounter for general adult medical examination with abnormal findings: Secondary | ICD-10-CM

## 2024-05-13 MED ORDER — EZETIMIBE 10 MG PO TABS: 10.0000 mg | ORAL_TABLET | Freq: Every day | ORAL | 0 refills | Status: AC

## 2024-05-13 MED ORDER — BASAGLAR KWIKPEN 100 UNIT/ML ~~LOC~~ SOPN: 60.0000 [IU] | PEN_INJECTOR | Freq: Every day | SUBCUTANEOUS | 1 refills | Status: DC

## 2024-05-13 MED ORDER — OZEMPIC (1 MG/DOSE) 4 MG/3ML ~~LOC~~ SOPN: 1.0000 mg | PEN_INJECTOR | SUBCUTANEOUS | 1 refills | Status: DC

## 2024-05-13 MED ORDER — PEN NEEDLES 32G X 4 MM MISC: 1.0000 "pen " | Freq: Four times a day (QID) | 3 refills | Status: AC

## 2024-05-13 NOTE — Telephone Encounter (Signed)
 Copied from CRM [id number]. Topic: Clinical - Prescription Issue >> May 13, 2024  2:26 PM Chasity T wrote: Reason for CRM: patient has called in regarding medication ozempic , MAGNESIUM -OXIDE 400 (240 Mg) MG tablet, and pantoprazole  (PROTONIX ) 40 MG tablet. States dr joshua told her he would send to pharmacy but they haven't received it yet.

## 2024-05-13 NOTE — Telephone Encounter (Signed)
 Copied from CRM #[id number]. Topic: Clinical - Prescription Issue >> May 13, 2024 10:26 AM Willma SAUNDERS wrote: Reason for CRM: Monico from [facility] [employer] calling to get clarification on directions for Insulin  Glargine (BASAGLAR  KWIKPEN) 100 UNIT/ML.  Monico can be reached at [phone]

## 2024-05-13 NOTE — Progress Notes (Signed)
 "  Subjective:  Patient ID: Theresa Harrington, female    DOB: 02-Sep-1955  Age: 69 y.o. MRN: [id number]  CC: Hypertension (Patient has been having high BP readings. ), Blood Sugar Problem (Patient states that her blood sugar readings has been running high in the mornings for a little over the week. ), Annual Exam, and Diabetes   HPI [facility] Bring presents for a CPX and f/up ---  Discussed the use of AI scribe software for clinical note transcription with the patient, who gave verbal consent to proceed.  History of Present Illness Theresa Harrington is a 69 year old female with hypertension who presents with blood pressure fluctuations.  She experiences significant fluctuations in her blood pressure, without associated symptoms such as headache, blurred vision, chest pain, or shortness of breath. No dizziness or lightheadedness from her medications. She takes hydralazine  for blood pressure management and takes an extra dose during spikes, which she believes helps.  She recently underwent an ablation procedure with no complications reported.  She reports excessive thirst and frequent urination, which she attributes to her current medication regimen, including insulin . She uses 50 units of insulin  at bedtime and 14 units of fast-acting insulin  with meals.     Outpatient Medications Prior to Visit  Medication Sig Dispense Refill   amiodarone  (PACERONE ) 200 MG tablet Take 1 tablet by mouth twice daily for 30 days (Patient taking differently: Take 200 mg by mouth daily.) 60 tablet 6   aspirin  EC 325 MG tablet Take 325 mg by mouth daily.     Blood Glucose Monitoring Suppl (ONETOUCH VERIO IQ SYSTEM) w/Device KIT USE TO CHECK SUGAR DAILY 1 kit 0   Continuous Glucose Receiver (FREESTYLE LIBRE 3 READER) DEVI Use to check blood glucose levels with Freestyle Libre 3 Plus sensors. 1 each 0   Continuous Glucose Sensor (FREESTYLE LIBRE 3 PLUS SENSOR) MISC Change sensor every 15 days. 2 each 5   diltiazem  (CARDIZEM  CD)  120 MG 24 hr capsule Take 1 capsule by mouth once daily 90 capsule 0   diltiazem  (CARDIZEM ) 30 MG tablet Take 30 mg by mouth every 4 (four) hours as needed (a-fib).     dorzolamide -timolol  (COSOPT ) 22.3-6.8 MG/ML ophthalmic solution Place 1 drop into the right eye 2 (two) times daily.      glucose blood (ONETOUCH VERIO) test strip Use to check blood sugar 3x per day 300 strip 3   hydrALAZINE  (APRESOLINE ) 50 MG tablet Take 50 mg by mouth in the morning and at bedtime.     insulin  lispro (HUMALOG  KWIKPEN) 100 UNIT/ML KwikPen Inject 14 Units into the skin with breakfast, with lunch, and with evening meal. DIAL  14 UNITS AND INJECT UNDER THE SKIN THREE TIMES A DAY. 45 mL 0   latanoprost  (XALATAN ) 0.005 % ophthalmic solution Place 1 drop into both eyes at bedtime.      MAGNESIUM -OXIDE 400 (240 Mg) MG tablet Take 1 tablet by mouth once daily 90 tablet 0   nebivolol  (BYSTOLIC ) 10 MG tablet Take 1 tablet by mouth once daily 90 tablet 3   pantoprazole  (PROTONIX ) 40 MG tablet Take 1 tablet by mouth twice daily (Patient taking differently: Take 40 mg by mouth daily.) 180 tablet 0   rosuvastatin  (CRESTOR ) 5 MG tablet Take 1 tablet by mouth once daily 90 tablet 3   SYNJARDY  5-500 MG TABS TAKE 1 TABLET BY MOUTH TWICE DAILY 120 tablet 1   torsemide  (DEMADEX ) 20 MG tablet Take 1 tablet by mouth once daily 90 tablet 0  XARELTO  20 MG TABS tablet Take 1 tablet by mouth once daily 90 tablet 0   ezetimibe  (ZETIA ) 10 MG tablet Take 1 tablet by mouth once daily 90 tablet 0   Insulin  Glargine (BASAGLAR  KWIKPEN) 100 UNIT/ML INJECT 50 UNITS UNDER THE SKIN DAILY (Patient taking differently: Inject 50 Units into the skin at bedtime.) 30 mL 5   Insulin  Pen Needle (PEN NEEDLES) 32G X 4 MM MISC Inject 1 pen  as directed 4 (four) times daily. Use to inject levemir  or novolog  as directed. 400 each 3   Semaglutide , 1 MG/DOSE, (OZEMPIC , 1 MG/DOSE,) 4 MG/3ML SOPN Inject 1 mg into the skin once a week. Via [employer] patient assistance  9 mL 1   doxycycline  (VIBRAMYCIN ) 100 MG capsule Take 1 capsule (100 mg total) by mouth 2 (two) times daily. 28 capsule 0   No facility-administered medications prior to visit.    ROS Review of Systems  Constitutional: Negative.  Negative for appetite change, diaphoresis, fatigue and fever.  HENT: Negative.    Respiratory: Negative.  Negative for cough, chest tightness, shortness of breath and wheezing.   Cardiovascular:  Negative for chest pain, palpitations and leg swelling.  Gastrointestinal:  Negative for abdominal pain, constipation, diarrhea, nausea and vomiting.  Endocrine: Positive for polydipsia and polyuria. Negative for polyphagia.  Genitourinary: Negative.  Negative for difficulty urinating, dysuria, flank pain and hematuria.  Musculoskeletal: Negative.  Negative for arthralgias and myalgias.  Skin: Negative.   Neurological:  Negative for dizziness, weakness and light-headedness.  Hematological:  Negative for adenopathy.  Psychiatric/Behavioral: Negative.      Objective:  BP (!) 142/72 (BP Location: Left Arm, Patient Position: Sitting, Cuff Size: Normal)   Pulse (!) 55   Temp 97.7 F (36.5 C) (Oral)   Resp 16   Ht 5' 4 (1.626 m)   Wt 171 lb 3.2 oz (77.7 kg)   LMP  (LMP Unknown)   SpO2 96%   BMI 29.39 kg/m   BP Readings from Last 3 Encounters:  05/13/24 (!) 142/72  05/12/24 (!) 207/76  05/08/24 (!) 169/70    Wt Readings from Last 3 Encounters:  05/13/24 171 lb 3.2 oz (77.7 kg)  05/12/24 173 lb 1.6 oz (78.5 kg)  05/08/24 170 lb (77.1 kg)    Physical Exam  Lab Results  Component Value Date   WBC 8.6 04/08/2024   HGB 14.0 04/08/2024   HCT 43.6 04/08/2024   PLT 250 04/08/2024   GLUCOSE 214 (H) 04/08/2024   CHOL 118 10/08/2023   TRIG 164.0 (H) 10/08/2023   HDL 46.60 10/08/2023   LDLDIRECT 160.2 03/01/2010   LDLCALC 38 10/08/2023   ALT 9 10/08/2023   AST 9 10/08/2023   NA 142 04/08/2024   K 4.1 04/08/2024   CL 101 04/08/2024   CREATININE 1.12  (H) 04/08/2024   BUN 14 04/08/2024   CO2 24 04/08/2024   TSH 0.70 10/12/2023   INR 1.0 03/09/2023   HGBA1C 8.8 (H) 04/08/2024   MICROALBUR 1.4 10/12/2023    EP STUDY Addendum Date: 05/08/2024 SURGEON:  Soyla Norton, MD PREPROCEDURE DIAGNOSES: 1. Persistent atrial fibrillation. 2. Typical atrial flutter POSTPROCEDURE DIAGNOSES: 1. Persistent atrial fibrillation. 2. Typical atrial flutter PROCEDURES: 1. Comprehensive electrophysiologic study. 2. Coronary sinus pacing and recording. 3. Three-dimensional mapping of atrial fibrillation with additional mapping and ablation of a second discrete focus 4. Ablation of atrial fibrillation with additional mapping and ablation of a second discrete focus 5. Intracardiac echocardiography. 6. Transseptal puncture of an intact septum.  INTRODUCTION:  Jamiyla Cavey is a 69 y.o. female with a history of persistent atrial fibrillation who now presents for EP study and radiofrequency ablation. She has had prior ablation but has had more atrial fibrillation and flutter. The patient therefore presents today for catheter ablation of atrial fibrillation. DESCRIPTION OF PROCEDURE:  Informed written consent was obtained, and the patient was brought to the electrophysiology lab in a fasting state.  The patient was adequately sedated with intravenous medications as outlined in the anesthesia report.  The patient's left and right groins were prepped and draped in the usual sterile fashion by the EP lab staff.  Using a percutaneous Seldinger technique, 1 8-French hemostasis sheaths were placed in the right femoral vein, and one 7 French and one 9-French hemostasis sheaths were placed into the left common femoral vein. The right femoral vein sheath site was preclosed using an Abbott Perclose and closed at the end of the case. A [employer] Webster Octarray catheter was advanced into the atrium to make a right atrial map. Direct ultrasound guidance is used for right and left femoral veins with  normal vessel patency. Ultrasound images are captured and stored in the patient's chart. Using ultrasound guidance, the Brockenbrough needle and wire were visualized entering the vessel. Catheter Placement:  A 7-French Biosense Webster Decapolar coronary sinus catheter was introduced through the right common femoral vein and advanced into the coronary sinus for recording and pacing from this location.  Initial Measurements: The patient presented to the electrophysiology lab in sinus rhythm. her  PR interval measured 179 msec with a QRS duration of 98 msec and a QT interval of 464 msec. Intracardiac Echocardiography: An 8-French [employer]ense Webster AcuNav intracardiac echocardiography catheter was introduced through the right common femoral vein and advanced into the right atrium. Intracardiac echocardiography was performed of the left atrium, and a three-dimensional anatomical rendering of the left atrium was performed using CARTO sound technology.  The patient was noted to have a moderate sized left atrium.  The interatrial septum was prominent but not aneurysmal. All 4 pulmonary veins were visualized and noted to have separate ostia.  The pulmonary veins were moderate in size.  The left atrial appendage was visualized and did not reveal thrombus.   There was no evidence of pulmonary vein stenosis. Transseptal Puncture: The right common femoral vein sheath was exchanged for one Varipulse sheath and transseptal access was achieved in a standard fashion using a Bayless pigtail wire under fluoroscopy with intracardiac echocardiography confirmation of the transseptal puncture.  Once transseptal access had been achieved, heparin  was administered intravenously and intra- arterially in order to maintain an ACT of greater than 350 seconds throughout the procedure. 3D Mapping and Ablation:  A  Biosense Webster Octarray catheter was advanced into the left atrium through the Visigo sheath.  The Octarray mapping catheter was  introduced through the transseptal sheath and positioned over the mouth of all 4 pulmonary veins.  Three-dimensional electroanatomical mapping was performed using CARTO technology.  This demonstrated electrical activity within the left inferior pulmonary vein and a fractionated tract through the posterior wall at baseline.  The Varipulse catheter was then placed into the left atrium.  The patient underwent successful sequential electrical isolation of the left lower pulmonary veins using pulsed field energy with 9 total pulses delivered.  Entrance and exit block were confirmed. Due to persistence of atrial fibrillation and risk of left atrial flutter, ablation using PFA was performed across the posterior wall. The Farawave catheter was removed from the  body and the pentarray catheter was introduced into the left atrium. 3D mapping was performed which confirmed electrical isolation of the pulmonary veins and posterior wall. The sheath was then pulled back into the right atrial and positioned along the cavo-tricuspid isthmus.  A Biosense Webster 8mm ablation catheter was placed through the sheath into the right atrium. Mapping along the atrial side of the isthmus was performed.  This demonstrated a standard isthmus.  A series of radiofrequency applications were then delivered along the isthmus.  Complete bidirectional cavotricuspid isthmus block was achieved as confirmed by differential atrial pacing from the low lateral right atrium.  A stimulus to earliest atrial activation across the isthmus measured 180 msec bi-directionally.  The patient was observe without return of conduction through the isthmus. Measurements Following Ablation: In sinus rhythm with RR interval was 1116 msec, with PR 168 msec, QRS 89 msec, and QT 435 msec.  Following ablation the AH interval measured 71 msec with an HV interval of 47 msec. Ventricular pacing was performed, which revealed VA dissociation when pacing at 600 msec. Rapid atrial  pacing was performed, which revealed an AV Wenckebach cycle length of 440 msec.  Electroisolation was then again confirmed in all four pulmonary veins.  Pacing was performed along the ablation line which confirmed entrance and exit block. The procedure was therefore considered completed.  All catheters were removed, and the sheaths were aspirated and flushed.  40 mg protamine  was given to reverse heparin .  The right femoral vein sheath was removed and closed using a Perclose device.  The left femoral vein sheaths were closed using the Mynx closure device and the sheaths were removed. The patient was transferred to the recovery area.  Intracardiac echocardiogram revealed no pericardial effusion.  There were no early apparent complications. CONCLUSIONS: 1. Sinus rhythm upon presentation.  2. Successful electrical isolation and anatomical encircling of all four pulmonary veins with pulsed field ablation. 3. Posterior wall isolation using pulse field ablation 4. Cavotricuspid isthmus ablation 5. No early apparent complications. Soyla Lunger Camnitz,MD 8:42 AM 05/08/2024   Result Date: 05/08/2024 SURGEON:  Soyla Norton, MD PREPROCEDURE DIAGNOSES: 1. Persistent atrial fibrillation. 2. Typical atrial flutter POSTPROCEDURE DIAGNOSES: 1. Persistent atrial fibrillation. 2. Typical atrial flutter PROCEDURES: 1. Comprehensive electrophysiologic study. 2. Coronary sinus pacing and recording. 3. Three-dimensional mapping of atrial fibrillation with additional mapping and ablation of a second discrete focus 4. Ablation of atrial fibrillation with additional mapping and ablation of a second discrete focus 5. Intracardiac echocardiography. 6. Transseptal puncture of an intact septum. INTRODUCTION:  Eurika Ullery is a 69 y.o. female with a history of persistent atrial fibrillation who now presents for EP study and radiofrequency ablation. She has had prior ablation but has had more atrial fibrillation and flutter. The patient therefore  presents today for catheter ablation of atrial fibrillation. DESCRIPTION OF PROCEDURE:  Informed written consent was obtained, and the patient was brought to the electrophysiology lab in a fasting state.  The patient was adequately sedated with intravenous medications as outlined in the anesthesia report.  The patient's left and right groins were prepped and draped in the usual sterile fashion by the EP lab staff.  Using a percutaneous Seldinger technique, 1 8-French hemostasis sheaths were placed in the right femoral vein, and one 7 French and one 9-French hemostasis sheaths were placed into the left common femoral vein. The right femoral vein sheath site was preclosed using an Abbott Perclose and closed at the end of the case. A Biosense [employer]  catheter was advanced into the atrium to make a right atrial map. Direct ultrasound guidance is used for right and left femoral veins with normal vessel patency. Ultrasound images are captured and stored in the patient's chart. Using ultrasound guidance, the Brockenbrough needle and wire were visualized entering the vessel. Catheter Placement:  A 7-French Biosense Webster Decapolar coronary sinus catheter was introduced through the right common femoral vein and advanced into the coronary sinus for recording and pacing from this location.  Initial Measurements: The patient presented to the electrophysiology lab in sinus rhythm. her  PR interval measured 179 msec with a QRS duration of 98 msec and a QT interval of 464 msec. Intracardiac Echocardiography: An 8-French Biosense Webster AcuNav intracardiac echocardiography catheter was introduced through the right common femoral vein and advanced into the right atrium. Intracardiac echocardiography was performed of the left atrium, and a three-dimensional anatomical rendering of the left atrium was performed using CARTO sound technology.  The patient was noted to have a moderate sized left atrium.  The interatrial  septum was prominent but not aneurysmal. All 4 pulmonary veins were visualized and noted to have separate ostia.  The pulmonary veins were moderate in size.  The left atrial appendage was visualized and did not reveal thrombus.   There was no evidence of pulmonary vein stenosis. Transseptal Puncture: The right common femoral vein sheath was exchanged for one Varipulse sheath and transseptal access was achieved in a standard fashion using a Bayless pigtail wire under fluoroscopy with intracardiac echocardiography confirmation of the transseptal puncture.  Once transseptal access had been achieved, heparin  was administered intravenously and intra- arterially in order to maintain an ACT of greater than 350 seconds throughout the procedure. 3D Mapping and Ablation:  A  Biosense Webster Octarray catheter was advanced into the left atrium through the Visigo sheath.  The Octarray mapping catheter was introduced through the transseptal sheath and positioned over the mouth of all 4 pulmonary veins.  Three-dimensional electroanatomical mapping was performed using CARTO technology.  This demonstrated electrical activity within the left inferior pulmonary vein and a fractionated tract through the posterior wall at baseline.  The Varipulse catheter was then placed into the left atrium.  The patient underwent successful sequential electrical isolation of the left lower pulmonary veins using pulsed field energy with 9 total pulses delivered.  Entrance and exit block were confirmed. Due to persistence of atrial fibrillation and risk of left atrial flutter, ablation using PFA was performed across the posterior wall. The Farawave catheter was removed from the body and the pentarray catheter was introduced into the left atrium. 3D mapping was performed which confirmed electrical isolation of the pulmonary veins and posterior wall. The sheath was then pulled back into the right atrial and positioned along the cavo-tricuspid isthmus.  A  Biosense Webster 8mm ablation catheter was placed through the sheath into the right atrium. Mapping along the atrial side of the isthmus was performed.  This demonstrated a standard isthmus.  A series of radiofrequency applications were then delivered along the isthmus.  Complete bidirectional cavotricuspid isthmus block was achieved as confirmed by differential atrial pacing from the low lateral right atrium.  A stimulus to earliest atrial activation across the isthmus measured 180 msec bi-directionally.  The patient was observe without return of conduction through the isthmus. Measurements Following Ablation: In sinus rhythm with RR interval was 1116 msec, with PR 168 msec, QRS 89 msec, and QT 435 msec.  Following ablation the AH interval measured 71 msec with  an HV interval of 47 msec. Ventricular pacing was performed, which revealed VA dissociation when pacing at 600 msec. Rapid atrial pacing was performed, which revealed an AV Wenckebach cycle length of 440 msec.  Electroisolation was then again confirmed in all four pulmonary veins.  Pacing was performed along the ablation line which confirmed entrance and exit block. The procedure was therefore considered completed.  All catheters were removed, and the sheaths were aspirated and flushed.  40 mg protamine  was given to reverse heparin .  The right femoral vein sheath was removed and closed using a Perclose device.  The left femoral vein sheaths were closed using the Mynx closure device and the sheaths were removed. The patient was transferred to the recovery area.  Intracardiac echocardiogram revealed no pericardial effusion.  There were no early apparent complications. CONCLUSIONS: 1. Sinus rhythm upon presentation.  2. Successful electrical isolation and anatomical encircling of all four pulmonary veins with pulsed field ablation. 3. Posterior wall isolation using pulse field ablation 4. Cavotricuspid isthmus ablation 5. No early apparent complications. Will  Gladis Camnitz,MD 8:42 AM 05/08/2024    Assessment & Plan:   Insulin -requiring or dependent type II diabetes mellitus (HCC)- A1C is up to 8.8%. Will increase the basal insulin  dose. -     Pen Needles; Inject 1 pen  as directed 4 (four) times daily. Use to inject levemir  or novolog  as directed.  Dispense: 400 each; Refill: 3 -     Basaglar  KwikPen; Inject 60 Units into the skin daily. INJECT 50 UNITS UNDER THE SKIN DAILY  Dispense: 60 mL; Refill: 1 -     AMB Referral VBCI Care Management -     Ozempic  (1 MG/DOSE); Inject 1 mg into the skin once a week. Via [employer] patient assistance  Dispense: 9 mL; Refill: 1  Diabetes mellitus with coincident hypertension (HCC)- BP is well controlled.  Essential hypertension, benign -     AMB Referral VBCI Care Management  Encounter for general adult medical examination with abnormal findings- Exam completed, labs reviewed, vaccines reviewed, cancer screenings addressed, pt ed material was given.   Screening mammogram for breast cancer -     Digital Screening Mammogram, Left and Right; Future  Hyperlipidemia with target LDL less than 100 -     Ezetimibe ; Take 1 tablet (10 mg total) by mouth daily.  Dispense: 90 tablet; Refill: 0  PAD (peripheral artery disease) -     Ezetimibe ; Take 1 tablet (10 mg total) by mouth daily.  Dispense: 90 tablet; Refill: 0  DM (diabetes mellitus) type II, controlled, with peripheral vascular disorder (HCC)- No claudication.     Follow-up: Return in about 6 months (around 11/10/2024).  Debby Molt, MD "

## 2024-05-13 NOTE — Patient Instructions (Signed)

## 2024-05-14 ENCOUNTER — Telehealth: Payer: Self-pay | Admitting: *Deleted

## 2024-05-14 NOTE — Telephone Encounter (Signed)
Patient has received  her medicine.

## 2024-05-14 NOTE — Progress Notes (Signed)
 Care Guide Pharmacy Note  05/14/2024 Name: Theresa Harrington MRN: [id number] DOB: [birth year]  Referred By: Joshua Debby CROME, MD Reason for referral: Complex Care Management (Outreach to schedule referral with pharmacist )   [name] is a 68 y.o. year old female who is a primary care patient of Joshua Debby CROME, MD.  [name] was referred to the pharmacist for assistance related to: DMII  Successful contact was made with the patient to discuss pharmacy services including being ready for the pharmacist to call at least 5 minutes before the scheduled appointment time and to have medication bottles and any blood pressure readings ready for review. The patient agreed to meet with the pharmacist via telephone visit on 05/26/2024  Thedford Franks, CMA, AAMA [place]  [facility], [facility] Guide, Lead Direct Dial : [phone]  Fax: [phone]

## 2024-05-17 DIAGNOSIS — E119 Type 2 diabetes mellitus without complications: Secondary | ICD-10-CM

## 2024-05-17 MED ORDER — BASAGLAR KWIKPEN 100 UNIT/ML ~~LOC~~ SOPN: 60.0000 [IU] | PEN_INJECTOR | Freq: Every day | SUBCUTANEOUS | 1 refills | Status: DC

## 2024-05-19 ENCOUNTER — Ambulatory Visit: Attending: Surgery | Admitting: Physician Assistant

## 2024-05-19 ENCOUNTER — Other Ambulatory Visit: Payer: Self-pay | Admitting: Physician Assistant

## 2024-05-19 VITALS — BP 157/76 | Ht 64.0 in | Wt 176.0 lb

## 2024-05-19 DIAGNOSIS — I7025 Atherosclerosis of native arteries of other extremities with ulceration: Secondary | ICD-10-CM | POA: Diagnosis not present

## 2024-05-19 MED ORDER — DOXYCYCLINE HYCLATE 100 MG PO CAPS: 100.0000 mg | ORAL_CAPSULE | Freq: Two times a day (BID) | ORAL | 0 refills | Status: AC

## 2024-05-19 NOTE — Progress Notes (Signed)
 "   Office Note   History of Present Illness   Theresa Harrington is a 69 y.o. (1955-08-04) female who presents for wound check.  She has a history of the following procedures:  06/08/2014: Left iliofemoral endarterectomy and femoral to below-knee popliteal bypass graft with vein (Dr. 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  At baseline she does have some claudication but this is not lifestyle limiting.  She was recently seen in our office 1 week ago with new wounds on her toes on the right.  She was recommended to keep her wounds clean and dry and apply Santyl daily.  She was also prescribed doxycycline  with concern for possible cellulitis.  She returns today for wound check.  She feels like her wounds may be a little bit better.  She denies any purulent drainage, fevers, or chills.  She is still applying Santyl daily to her wounds and taking her antibiotic.  She denies any rest pain.  Current Outpatient Medications  Medication Sig Dispense Refill   amiodarone  (PACERONE ) 200 MG tablet Take 1 tablet by mouth twice daily for 30 days (Patient taking differently: Take 200 mg by mouth daily.) 60 tablet 6   aspirin  EC 325 MG tablet Take 325 mg by mouth daily.     Blood Glucose Monitoring Suppl (ONETOUCH VERIO IQ SYSTEM) w/Device KIT USE TO CHECK SUGAR DAILY 1 kit 0   Continuous Glucose Receiver (FREESTYLE LIBRE 3 READER) DEVI Use to check blood glucose levels with Freestyle Libre 3 Plus sensors. 1 each 0   Continuous Glucose Sensor (FREESTYLE LIBRE 3 PLUS SENSOR) MISC Change sensor every 15  days. 2 each 5   diltiazem  (CARDIZEM  CD) 120 MG 24 hr capsule Take 1 capsule by mouth once daily 90 capsule 0   diltiazem  (CARDIZEM ) 30 MG tablet Take 30 mg by mouth every 4 (four) hours as needed (a-fib).     dorzolamide -timolol  (COSOPT ) 22.3-6.8 MG/ML ophthalmic solution Place 1 drop into the right eye 2 (two) times daily.      doxycycline  (VIBRAMYCIN ) 100 MG capsule Take 1 capsule (100 mg total) by mouth 2 (two) times daily. 28 capsule 0   ezetimibe  (ZETIA ) 10 MG tablet Take 1 tablet (10 mg total) by mouth daily. 90 tablet 0   glucose blood (ONETOUCH VERIO) test strip Use to check blood sugar 3x per day 300 strip 3   hydrALAZINE  (APRESOLINE ) 50 MG tablet Take 50 mg by mouth in the morning and at bedtime.     Insulin  Glargine (BASAGLAR  KWIKPEN) 100 UNIT/ML Inject 60 Units into the skin daily. 60 mL 1   insulin  lispro (HUMALOG  KWIKPEN) 100 UNIT/ML KwikPen Inject 14 Units into the skin with breakfast, with lunch, and with evening meal. DIAL  14 UNITS AND INJECT UNDER THE SKIN THREE TIMES A DAY. 45 mL 0   Insulin  Pen Needle (PEN NEEDLES) 32G X 4 MM MISC Inject 1 pen  as directed 4 (four) times daily. Use to inject levemir  or novolog  as directed. 400 each 3   latanoprost  (XALATAN ) 0.005 % ophthalmic solution Place 1 drop into both eyes at bedtime.      MAGNESIUM -OXIDE 400 (240 Mg) MG tablet Take 1 tablet by mouth once daily 90 tablet  0   nebivolol  (BYSTOLIC ) 10 MG tablet Take 1 tablet by mouth once daily 90 tablet 3   pantoprazole  (PROTONIX ) 40 MG tablet Take 1 tablet by mouth twice daily 180 tablet 0   rosuvastatin  (CRESTOR ) 5 MG tablet Take 1 tablet by mouth once daily 90 tablet 3   SYNJARDY  5-500 MG TABS TAKE 1 TABLET BY MOUTH TWICE DAILY 120 tablet 1   torsemide  (DEMADEX ) 20 MG tablet Take 1 tablet by mouth once daily 90 tablet 0   XARELTO  20 MG TABS tablet Take 1 tablet by mouth once daily 90 tablet 0   Semaglutide , 1 MG/DOSE, (OZEMPIC , 1 MG/DOSE,) 4 MG/3ML SOPN Inject 1 mg into the skin once a  week. Via [employer] patient assistance (Patient not taking: Reported on 05/19/2024) 9 mL 1   No current facility-administered medications for this visit.    REVIEW OF SYSTEMS (negative unless checked):   Cardiac:  []  Chest pain or chest pressure? []  Shortness of breath upon activity? []  Shortness of breath when lying flat? []  Irregular heart rhythm?  Vascular:  []  Pain in calf, thigh, or hip brought on by walking? []  Pain in feet at night that wakes you up from your sleep? []  Blood clot in your veins? []  Leg swelling?  Pulmonary:  []  Oxygen  at home? []  Productive cough? []  Wheezing?  Neurologic:  []  Sudden weakness in arms or legs? []  Sudden numbness in arms or legs? []  Sudden onset of difficult speaking or slurred speech? []  Temporary loss of vision in one eye? []  Problems with dizziness?  Gastrointestinal:  []  Blood in stool? []  Vomited blood?  Genitourinary:  []  Burning when urinating? []  Blood in urine?  Psychiatric:  []  Major depression  Hematologic:  []  Bleeding problems? []  Problems with blood clotting?  Dermatologic:  []  Rashes or ulcers?  Constitutional:  []  Fever or chills?  Ear/Nose/Throat:  []  Change in hearing? []  Nose bleeds? []  Sore throat?  Musculoskeletal:  []  Back pain? []  Joint pain? []  Muscle pain?   Physical Examination   Vitals:   05/19/24 0856  BP: (!) 157/76  Weight: 176 lb (79.8 kg)  Height: 5' 4 (1.626 m)   Body mass index is 30.21 kg/m.  General:  WDWN in NAD; vital signs documented above Gait: Not observed HENT: WNL, normocephalic Pulmonary: normal non-labored breathing , Cardiac: regular Abdomen: soft, NT, no masses Skin: without rashes Vascular Exam/Pulses: right PT and AT doppler signals Extremities: wounds on the right toes as pictured below Musculoskeletal: no muscle wasting or atrophy  Neurologic: A&O X 3;  No focal weakness or paresthesias are detected Psychiatric:  The pt has normal  affect       Medical Decision Making   Theresa Harrington is a 69 y.o. female who presents for wound check  The patient was last seen in our office 1 week ago with new wounds on her toes on the right.  We opted for conservative management including antibiotics and wound care. At follow-up today her wounds look a little bit better.  She still has some erythema to her toes with possible underlying cellulitis.  She has no purulent drainage from her wounds.  It appears that the wound on her third toe has healed. On exam she has intact right AT and PT Doppler signals Dr.Brabham also evaluated the patient.  We have discussed with the patient to continue applying Santyl to her wounds daily.  She will also paint her toes daily with Betadine.  She will continue her  doxycycline  for another 2 weeks. She can return to clinic in 2 weeks for repeat wound check.  She is aware that if her wounds worsen she will require repeat angiography and likely repeat right lower extremity bypass   Ahmed Holster PA-C [facility] Office: [phone]  Clinic MD: Serene  "

## 2024-05-22 ENCOUNTER — Other Ambulatory Visit (HOSPITAL_COMMUNITY): Payer: Self-pay

## 2024-05-22 ENCOUNTER — Telehealth: Payer: Self-pay | Admitting: Pharmacy Technician

## 2024-05-22 ENCOUNTER — Telehealth: Payer: Self-pay

## 2024-05-22 NOTE — Telephone Encounter (Signed)
 Pharmacy Patient Advocate Encounter   Received notification from [employer] C[employer] KEY that prior authorization for Ozempic  (1 MG/DOSE) 4MG /3ML pen-injectors  is required/requested.   Insurance verification completed.   The patient is insured through [employer] ADVANTAGE/RX ADVANCE.   Per test claim: PA required; PA submitted to above mentioned insurance via Latent Key/confirmation #/EOC [id number] Status is pending

## 2024-05-22 NOTE — Telephone Encounter (Signed)
 Pharmacy Patient Advocate Encounter   Received notification from [facility] KEY that prior authorization for Basaglar  is required/requested.   Insurance verification completed.   The patient is insured through [facility] ADVANTAGE/RX ADVANCE.   Per test claim:  Lantus  is preferred by the insurance.  If suggested medication is appropriate, Please send in a new RX and discontinue this one. If not, please advise as to why it's not appropriate so that we may request a Prior Authorization. Please note, some preferred medications may still require a PA.  If the suggested medications have not been trialed and there are no contraindications to their use, the PA will not be submitted, as it will not be approved. Archived Key: [id number]

## 2024-05-23 ENCOUNTER — Other Ambulatory Visit: Payer: Self-pay | Admitting: Internal Medicine

## 2024-05-23 DIAGNOSIS — E119 Type 2 diabetes mellitus without complications: Secondary | ICD-10-CM

## 2024-05-23 MED ORDER — LANTUS SOLOSTAR 100 UNIT/ML ~~LOC~~ SOPN: 60.0000 [IU] | PEN_INJECTOR | Freq: Every day | SUBCUTANEOUS | 0 refills | Status: AC

## 2024-05-23 NOTE — Telephone Encounter (Signed)
 Is the medication change appropriate ?

## 2024-05-26 ENCOUNTER — Other Ambulatory Visit: Admitting: Pharmacist

## 2024-05-26 ENCOUNTER — Telehealth: Payer: Self-pay

## 2024-05-26 DIAGNOSIS — E119 Type 2 diabetes mellitus without complications: Secondary | ICD-10-CM

## 2024-05-26 MED ORDER — FREESTYLE LIBRE 3 PLUS SENSOR MISC: 5 refills | Status: AC

## 2024-05-26 MED ORDER — FREESTYLE LIBRE 3 READER DEVI: 0 refills | Status: AC

## 2024-05-26 NOTE — Telephone Encounter (Signed)
 Pharmacy Patient Advocate Encounter  Received notification from [employer]/RX ADVANCE that Prior Authorization for Ozempic  (1 MG/DOSE) 4MG /3ML pen-injectors   has been APPROVED  22-JAN-26:22-JAN-27 Quantity:9; 84 DAYS   PA #/Case ID/Reference #: [id number]

## 2024-05-26 NOTE — Progress Notes (Signed)
 "  05/26/24  Name: Theresa Harrington MRN: [id number] DOB: 1955-12-10  Chief Complaint  Patient presents with   Diabetes   Medication Management   Medication Assistance    Theresa Harrington is a 69 y.o. year old female who presented for a telephone visit.   They were referred to the pharmacist by their PCP for assistance in managing diabetes.   Subjective:  Care Team: Primary Care Provider: Joshua Debby CROME, MD ; Next Scheduled Visit: not scheduled  Medication Access/Adherence  Current Pharmacy:  [facility] [address], [place] - [address] [facility] [place] [address] Phone: [phone] Fax: [phone]  [facility], [place] - [address] [place] [address] Phone: [phone] Fax: [phone]  [name] [address] [place] [address] Phone: [phone] Fax: [phone]  [facility] [place], [place] - [phone] [place] [address] Phone: [phone] Fax: [phone]  [facility] - [place], [place] - [address] [place] [address] Phone: [phone] Fax: [phone]   Patient reports affordability concerns with their medications: Yes Patient reports access/transportation concerns to their pharmacy: No  Patient reports adherence concerns with their medications:  No     Diabetes:  Current medications: Basaglar  60 units daily, Humalog  14 units TID AC, Synjardy  5/500 mg BID  Has been out of Ozempic  1 mg weekly. Last dose was 2 weeks ago. Patient has been waiting on prior authorization. Deductible has been met for the year and cost (co-insurance) is ~$197 per month.  Patient reports she has 2 months worth of supply left for Humalog . States she doesn't always take it with meals when she doesn't have the medication with her.  Patient hesitant to wear CGM due to recent recalls,  but willing to try again for better management. She disposed of her monitor and sensors.  Current glucose readings: BG 87-113 in the AM testing 1 time per day; reports she was having highs up to 200s in the mornings and Basaglar  was recently increased from 50u to 60u d/t this. Fasting glucose has since improved   Patient denies hypoglycemic s/sx including dizziness, shakiness, sweating. Patient endorses some hyperglycemic symptoms including polyuria,  polyphagia.  Current meal patterns:  Eats three times per day- Breakfast: egg, toast, sausage Lunch: sandwich Dinner: sandwich   Current medication access support: [employer]ares: Basaglar  through 2026; [employer]ares: Synjardy  through 2026 Patient has not yet received any medications   Objective:  Lab Results  Component Value Date   HGBA1C 8.8 (H) 04/08/2024    Lab Results  Component Value Date   CREATININE 1.12 (H) 04/08/2024   BUN 14 04/08/2024   NA 142 04/08/2024   K 4.1 04/08/2024   CL 101 04/08/2024   CO2 24 04/08/2024    Lab Results  Component Value Date   CHOL 118 10/08/2023   HDL 46.60 10/08/2023   LDLCALC 38 10/08/2023   LDLDIRECT 160.2 03/01/2010   TRIG 164.0 (H) 10/08/2023   CHOLHDL 3 10/08/2023    Medications Reviewed Today     Reviewed by Maybelle Dionicia Harrington, RPH (Pharmacist) on 05/26/24 at 1521  Med List Status: <None>   Medication Order Taking? Sig Documenting Provider Last Dose Status Informant  amiodarone  (PACERONE ) 200 MG tablet [id number] Yes Take 1 tablet by mouth twice daily for 30 days  Patient taking differently: Take 200 mg by mouth  daily.   Terra Fairy PARAS, PA-C  Active Self, Pharmacy Records  aspirin  EC 325 MG tablet [id number] Yes Take 325 mg by mouth daily. [provider]  Active Self, Pharmacy Records  Blood Glucose Monitoring Suppl [facility] VERIO IQ SYSTEM) w/Device KIT [id number] Yes USE TO CHECK SUGAR DAILY Joshua Debby CROME, MD  Active Self, Pharmacy Records  Continuous Glucose  Receiver (FREESTYLE LIBRE 3 READER) DEVI [id number]  Use to check blood glucose levels with Freestyle Libre 3 Plus sensors.  Patient not taking: Reported on 05/26/2024   Joshua Debby CROME, MD  Active Self, Pharmacy Records  Continuous Glucose Sensor (FREESTYLE LIBRE 3 PLUS SENSOR) [place] [id number]  Change sensor every 15 days.  Patient not taking: Reported on 05/26/2024   Joshua Debby CROME, MD  Active Self, Pharmacy Records  diltiazem  (CARDIZEM  CD) 120 MG 24 hr capsule [id number] Yes Take 1 capsule by mouth once daily Joshua Debby CROME, MD  Active   diltiazem  (CARDIZEM ) 30 MG tablet [id number] Yes Take 30 mg by mouth every 4 (four) hours as needed (a-fib). [provider]  Active Self, Pharmacy Records           Med Note [facility], ALI   Tue May 06, 2024  1:00 PM) Has on hand if needed  dorzolamide -timolol  (COSOPT ) 22.3-6.8 MG/ML ophthalmic solution [id number] Yes Place 1 drop into the right eye 2 (two) times daily.  [provider]  Active Self, Pharmacy Records           Med Note Theresa Harrington   Fri Apr 06, 2023  7:26 PM)    doxycycline  (VIBRAMYCIN ) 100 MG capsule [id number]  Take 1 capsule (100 mg total) by mouth 2 (two) times daily. Schuh, McKenzi P, PA-C  Active   ezetimibe  (ZETIA ) 10 MG tablet [id number] Yes Take 1 tablet (10 mg total) by mouth daily. Joshua Debby CROME, MD  Active   glucose blood [facility] VERIO) test strip [id number] Yes Use to check blood sugar 3x per day Joshua Debby CROME, MD  Active Self, Pharmacy Records  hydrALAZINE  (APRESOLINE ) 50 MG tablet [id number] Yes Take 50 mg by mouth in the morning and at bedtime. [provider]  Active Self, Pharmacy Records    Discontinued 05/23/24 0920 (Cost of medication)   insulin  glargine (LANTUS  SOLOSTAR) 100 UNIT/ML Solostar Pen [id number] Yes Inject 60 Units into the skin daily. Joshua Debby CROME, MD  Active   insulin  lispro (HUMALOG  KWIKPEN) 100 UNIT/ML KwikPen [id number] Yes Inject 14 Units into the skin with breakfast, with  lunch, and with evening meal. DIAL  14 UNITS AND INJECT UNDER THE SKIN THREE TIMES A DAY. Joshua Debby CROME, MD  Active Self, Pharmacy Records           Med Note GILMORE, DAPHNE D   Thu May 08, 2024  5:54 AM) 8 units  Insulin  Pen Needle (PEN NEEDLES) 32G X 4 MM MISC [id number] Yes Inject 1 pen  as directed 4 (four) times daily. Use to inject levemir  or novolog  as directed. Joshua Debby CROME, MD  Active   latanoprost  (XALATAN ) 0.005 % ophthalmic solution [id number] Yes Place 1 drop into both eyes at bedtime.  [provider]  Active Self, Pharmacy Records           Med Note Theresa Harrington   Fri Apr 06, 2023  7:20 PM)    MAGNESIUM -OXIDE 400 (240 Mg) MG tablet [id number] Yes Take 1 tablet by mouth once daily Joshua Debby CROME, MD  Active   nebivolol  (  BYSTOLIC ) 10 MG tablet [id number] Yes Take 1 tablet by mouth once daily Jeffrie Oneil BROCKS, MD  Active Self, Pharmacy Records  pantoprazole  (PROTONIX ) 40 MG tablet [id number] Yes Take 1 tablet by mouth twice daily Joshua Debby CROME, MD  Active   rosuvastatin  (CRESTOR ) 5 MG tablet [id number] Yes Take 1 tablet by mouth once daily Jeffrie Oneil BROCKS, MD  Active Self, Pharmacy Records  Semaglutide , 1 MG/DOSE, (OZEMPIC , 1 MG/DOSE,) 4 MG/3ML SOPN [id number]  Inject 1 mg into the skin once a week. Via Nov[employer] Cares patient assistance  Patient not taking: Reported on 05/26/2024   Joshua Debby CROME, MD  Active   SYNJARDY  5-500 MG TABS [id number] Yes TAKE 1 TABLET BY MOUTH TWICE DAILY Joshua Debby CROME, MD  Active Self, Pharmacy Records  torsemide  (DEMADEX ) 20 MG tablet [id number] Yes Take 1 tablet by mouth once daily Joshua Debby CROME, MD  Active Self, Pharmacy Records  XARELTO  20 MG TABS tablet [id number] Yes Take 1 tablet by mouth once daily Joshua Debby CROME, MD  Active Self, Pharmacy Records              Assessment/Plan:   Diabetes: - Currently uncontrolled, A1c goal <7% - Recommend to continue current regimen and start using CGM to better understand trends and how to  adjust regimen. Patient agreeable to this. Will send Rx to pharmacy to determine cost of monitor replacement and new sensor. - Discussed Synjardy  and Basaglar  approved through manufacturer patient assistance and will be receiving medications via mail. - Discussed co-insurance cost for Ozempic  and recent approved prior authorization. Patient would like to try Trulicity  for cost purposes. Patient understands that she will need to start titration from beginning with Trulicity  since has been off Ozempic  for 2 weeks. Will submit Trulicity  PAP.  - Recommend to continue monitoring blood glucose, both fasting and 2 hours after meal. - Will submit PAP for Humalog .   Follow Up Plan: will call patient with results of PAP/cost of CGM  Dionicia Canavan, PharmD, RPh PGY1 Acute Care Pharmacy Resident [facility]  Darrelyn Drum, PharmD, BCPS, CPP Clinical Pharmacist Practitioner [facility] [phone]     "

## 2024-05-26 NOTE — Progress Notes (Signed)
 Unsuccessful attempt for patient's telephone encounter with Cristy Patille. Left voicemail for call back to [phone].

## 2024-05-26 NOTE — Patient Instructions (Addendum)
 It was a pleasure speaking with you today!  -No changes to your medication regimen. Continue to monitor your blood glucose, both fasting and 2 hours after your biggest meal. -I will process paperwork for Trulicity  Patient Assistance and will be in touch with you about the outcome.  Feel free to call with any questions or concerns!  Dionicia Canavan, PharmD, RPh PGY1 Acute Care Pharmacy Resident [facility]  Darrelyn Drum, PharmD, BCPS, CPP Clinical Pharmacist Practitioner [facility] [phone]

## 2024-05-27 ENCOUNTER — Telehealth: Payer: Self-pay

## 2024-05-27 MED ORDER — TRULICITY 0.75 MG/0.5ML ~~LOC~~ SOAJ: SUBCUTANEOUS | Status: AC

## 2024-05-27 NOTE — Addendum Note (Signed)
 Addended by: MERCEDA DARRELYN SAUNDERS on: 05/27/2024 02:38 PM   Modules accepted: Orders

## 2024-06-02 ENCOUNTER — Ambulatory Visit

## 2024-06-02 ENCOUNTER — Ambulatory Visit (HOSPITAL_COMMUNITY)

## 2024-06-02 ENCOUNTER — Encounter: Payer: Self-pay | Admitting: Physician Assistant

## 2024-06-02 VITALS — BP 170/75 | HR 50 | Ht 64.0 in | Wt 178.6 lb

## 2024-06-02 DIAGNOSIS — I7025 Atherosclerosis of native arteries of other extremities with ulceration: Secondary | ICD-10-CM | POA: Diagnosis not present

## 2024-06-03 ENCOUNTER — Other Ambulatory Visit: Payer: Self-pay

## 2024-06-03 DIAGNOSIS — I7025 Atherosclerosis of native arteries of other extremities with ulceration: Secondary | ICD-10-CM

## 2024-06-04 ENCOUNTER — Telehealth: Payer: Self-pay

## 2024-06-04 NOTE — Telephone Encounter (Signed)
 This encounter was created in error - please disregard.

## 2024-06-04 NOTE — Telephone Encounter (Signed)
 Pt stated BP 140/78 today. Pt had called for change appt. Pt stated she didn't want to get out in this mess. Rescheduled for 06/09/24 at 1000. No sooner openings with PCP next week.

## 2024-06-05 ENCOUNTER — Ambulatory Visit: Admitting: Internal Medicine

## 2024-06-05 ENCOUNTER — Ambulatory Visit (HOSPITAL_COMMUNITY): Admitting: Internal Medicine

## 2024-06-09 ENCOUNTER — Ambulatory Visit: Admitting: Family Medicine

## 2024-06-13 ENCOUNTER — Ambulatory Visit (HOSPITAL_COMMUNITY): Admitting: Internal Medicine

## 2024-06-17 ENCOUNTER — Encounter (HOSPITAL_COMMUNITY): Payer: Self-pay

## 2024-06-17 ENCOUNTER — Ambulatory Visit (HOSPITAL_COMMUNITY): Admit: 2024-06-17 | Admitting: Surgery

## 2024-06-17 DIAGNOSIS — I7025 Atherosclerosis of native arteries of other extremities with ulceration: Secondary | ICD-10-CM

## 2024-08-14 ENCOUNTER — Ambulatory Visit (HOSPITAL_COMMUNITY): Admitting: Internal Medicine

## 2024-10-15 ENCOUNTER — Ambulatory Visit
